# Patient Record
Sex: Male | Born: 1960 | Race: White | Hispanic: No | Marital: Married | Smoking: Former smoker
Health system: Southern US, Community
[De-identification: ages and names within clinical notes are randomized; demographics above are authoritative.]

## PROBLEM LIST (undated history)

## (undated) DIAGNOSIS — I509 Heart failure, unspecified: Secondary | ICD-10-CM

## (undated) DIAGNOSIS — Z599 Problem related to housing and economic circumstances, unspecified: Secondary | ICD-10-CM

## (undated) DIAGNOSIS — G4730 Sleep apnea, unspecified: Secondary | ICD-10-CM

## (undated) DIAGNOSIS — I4891 Unspecified atrial fibrillation: Secondary | ICD-10-CM

## (undated) DIAGNOSIS — Z598 Other problems related to housing and economic circumstances: Secondary | ICD-10-CM

## (undated) DIAGNOSIS — I4819 Other persistent atrial fibrillation: Secondary | ICD-10-CM

## (undated) DIAGNOSIS — Z91199 Patient's noncompliance with other medical treatment and regimen due to unspecified reason: Secondary | ICD-10-CM

## (undated) DIAGNOSIS — I429 Cardiomyopathy, unspecified: Secondary | ICD-10-CM

## (undated) DIAGNOSIS — Z5989 Other problems related to housing and economic circumstances: Secondary | ICD-10-CM

## (undated) DIAGNOSIS — I10 Essential (primary) hypertension: Secondary | ICD-10-CM

## (undated) DIAGNOSIS — Z5971 Insufficient health insurance coverage: Secondary | ICD-10-CM

## (undated) DIAGNOSIS — E119 Type 2 diabetes mellitus without complications: Secondary | ICD-10-CM

## (undated) DIAGNOSIS — Z9119 Patient's noncompliance with other medical treatment and regimen: Secondary | ICD-10-CM

## (undated) DIAGNOSIS — Z7901 Long term (current) use of anticoagulants: Secondary | ICD-10-CM

## (undated) HISTORY — DX: Long term (current) use of anticoagulants: Z79.01

## (undated) HISTORY — DX: Essential (primary) hypertension: I10

## (undated) HISTORY — DX: Type 2 diabetes mellitus without complications: E11.9

## (undated) HISTORY — DX: Unspecified atrial fibrillation: I48.91

## (undated) HISTORY — DX: Sleep apnea, unspecified: G47.30

## (undated) HISTORY — DX: Cardiomyopathy, unspecified: I42.9

---
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
## 2014-04-07 ENCOUNTER — Telehealth: Payer: Self-pay | Admitting: Dietician

## 2014-04-07 NOTE — Telephone Encounter (Signed)
Opened in error

## 2017-09-26 ENCOUNTER — Encounter (HOSPITAL_COMMUNITY): Payer: Self-pay | Admitting: Emergency Medicine

## 2017-09-26 ENCOUNTER — Inpatient Hospital Stay (HOSPITAL_COMMUNITY)
Admission: EM | Admit: 2017-09-26 | Discharge: 2017-09-30 | DRG: 310 | Disposition: A | Discharge status: Home / Self Care | Payer: Self-pay | Attending: Cardiovascular Disease | Admitting: Cardiovascular Disease

## 2017-09-26 ENCOUNTER — Emergency Department (HOSPITAL_COMMUNITY): Payer: Self-pay

## 2017-09-26 ENCOUNTER — Other Ambulatory Visit: Payer: Self-pay

## 2017-09-26 DIAGNOSIS — G4730 Sleep apnea, unspecified: Secondary | ICD-10-CM | POA: Diagnosis present

## 2017-09-26 DIAGNOSIS — I429 Cardiomyopathy, unspecified: Secondary | ICD-10-CM | POA: Diagnosis present

## 2017-09-26 DIAGNOSIS — E119 Type 2 diabetes mellitus without complications: Secondary | ICD-10-CM

## 2017-09-26 DIAGNOSIS — Z7901 Long term (current) use of anticoagulants: Secondary | ICD-10-CM

## 2017-09-26 DIAGNOSIS — I4819 Other persistent atrial fibrillation: Secondary | ICD-10-CM

## 2017-09-26 DIAGNOSIS — Z794 Long term (current) use of insulin: Secondary | ICD-10-CM

## 2017-09-26 DIAGNOSIS — R7309 Other abnormal glucose: Secondary | ICD-10-CM

## 2017-09-26 DIAGNOSIS — I481 Persistent atrial fibrillation: Principal | ICD-10-CM | POA: Diagnosis present

## 2017-09-26 DIAGNOSIS — I4891 Unspecified atrial fibrillation: Secondary | ICD-10-CM | POA: Diagnosis present

## 2017-09-26 DIAGNOSIS — E1165 Type 2 diabetes mellitus with hyperglycemia: Secondary | ICD-10-CM | POA: Diagnosis present

## 2017-09-26 DIAGNOSIS — I10 Essential (primary) hypertension: Secondary | ICD-10-CM | POA: Diagnosis present

## 2017-09-26 DIAGNOSIS — I4821 Permanent atrial fibrillation: Secondary | ICD-10-CM | POA: Diagnosis present

## 2017-09-26 DIAGNOSIS — R072 Precordial pain: Secondary | ICD-10-CM

## 2017-09-26 DIAGNOSIS — F17200 Nicotine dependence, unspecified, uncomplicated: Secondary | ICD-10-CM | POA: Diagnosis present

## 2017-09-26 DIAGNOSIS — Z8249 Family history of ischemic heart disease and other diseases of the circulatory system: Secondary | ICD-10-CM

## 2017-09-26 DIAGNOSIS — F17210 Nicotine dependence, cigarettes, uncomplicated: Secondary | ICD-10-CM | POA: Diagnosis present

## 2017-09-26 DIAGNOSIS — G4733 Obstructive sleep apnea (adult) (pediatric): Secondary | ICD-10-CM | POA: Diagnosis present

## 2017-09-26 HISTORY — DX: Type 2 diabetes mellitus without complications: E11.9

## 2017-09-26 HISTORY — DX: Unspecified atrial fibrillation: I48.91

## 2017-09-26 HISTORY — DX: Essential (primary) hypertension: I10

## 2017-09-26 LAB — GLUCOSE, CAPILLARY: Glucose-Capillary: 241 mg/dL — ABNORMAL HIGH (ref 70–99)

## 2017-09-26 LAB — BASIC METABOLIC PANEL
ANION GAP: 13 (ref 5–15)
BUN: 12 mg/dL (ref 6–20)
CO2: 22 mmol/L (ref 22–32)
Calcium: 9.8 mg/dL (ref 8.9–10.3)
Chloride: 100 mmol/L (ref 98–111)
Creatinine, Ser: 1.14 mg/dL (ref 0.61–1.24)
Glucose, Bld: 322 mg/dL — ABNORMAL HIGH (ref 70–99)
Potassium: 3.9 mmol/L (ref 3.5–5.1)
SODIUM: 135 mmol/L (ref 135–145)

## 2017-09-26 LAB — CBC
HCT: 56.7 % — ABNORMAL HIGH (ref 39.0–52.0)
HEMOGLOBIN: 19.6 g/dL — AB (ref 13.0–17.0)
MCH: 32.1 pg (ref 26.0–34.0)
MCHC: 34.6 g/dL (ref 30.0–36.0)
MCV: 93 fL (ref 78.0–100.0)
Platelets: 262 10*3/uL (ref 150–400)
RBC: 6.1 MIL/uL — AB (ref 4.22–5.81)
RDW: 12.6 % (ref 11.5–15.5)
WBC: 10.8 10*3/uL — AB (ref 4.0–10.5)

## 2017-09-26 LAB — HEMOGLOBIN A1C
Hgb A1c MFr Bld: 8.5 % — ABNORMAL HIGH (ref 4.8–5.6)
Mean Plasma Glucose: 197.25 mg/dL

## 2017-09-26 LAB — PROTIME-INR
INR: 1.13
Prothrombin Time: 14.4 seconds (ref 11.4–15.2)

## 2017-09-26 LAB — BRAIN NATRIURETIC PEPTIDE: B Natriuretic Peptide: 105.4 pg/mL — ABNORMAL HIGH (ref 0.0–100.0)

## 2017-09-26 LAB — I-STAT TROPONIN, ED: TROPONIN I, POC: 0.02 ng/mL (ref 0.00–0.08)

## 2017-09-26 LAB — T4, FREE: Free T4: 0.79 ng/dL — ABNORMAL LOW (ref 0.82–1.77)

## 2017-09-26 LAB — MAGNESIUM: MAGNESIUM: 1.7 mg/dL (ref 1.7–2.4)

## 2017-09-26 LAB — APTT: aPTT: 32 s (ref 24–36)

## 2017-09-26 LAB — TSH: TSH: 1.103 u[IU]/mL (ref 0.350–4.500)

## 2017-09-26 IMAGING — DX DG CHEST 1V PORT
1 series · 1 of 1 positions shown · non-contrast
Comparison: None.

CLINICAL DATA: Chest pain and nausea

EXAM:
PORTABLE CHEST 1 VIEW

[chest ap]
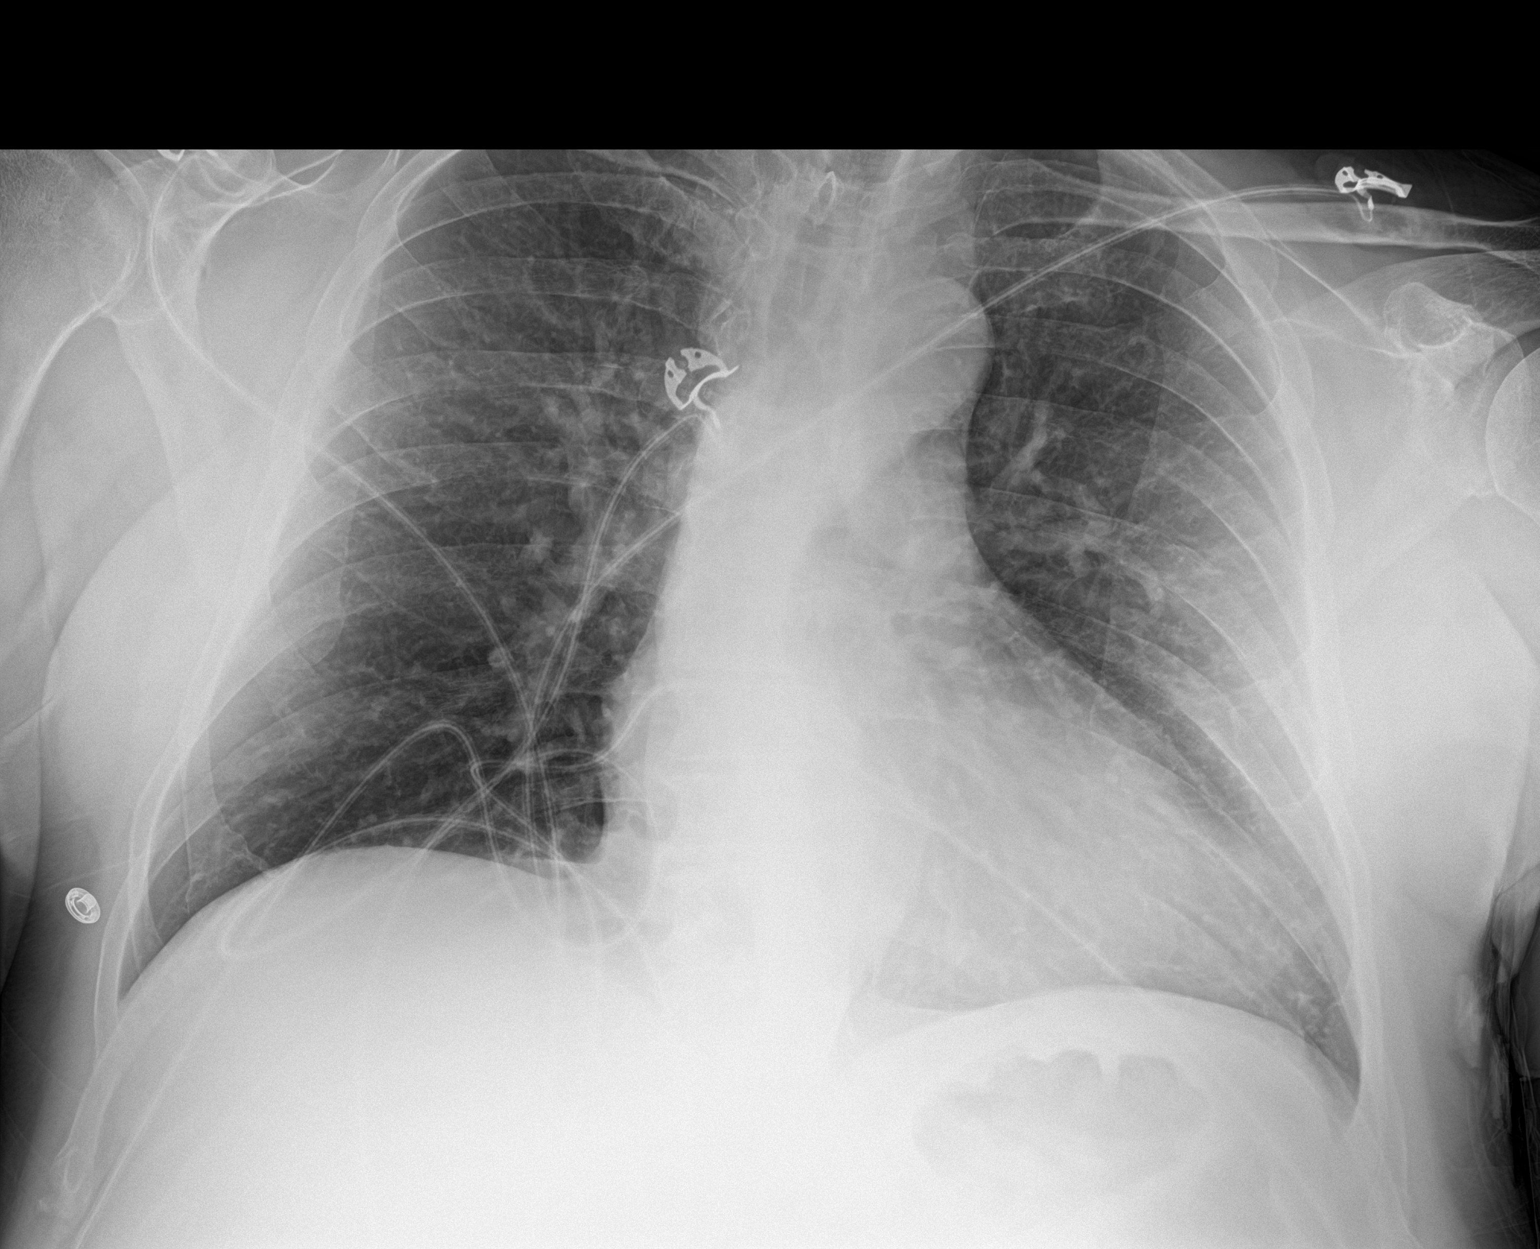

[1 of 1 positions shown; findings below may reference images not displayed]

FINDINGS: The heart size and mediastinal contours are within normal limits.
Both lungs are clear. The visualized skeletal structures are
unremarkable.
IMPRESSION: No active disease.

## 2017-09-26 MED ORDER — DILTIAZEM HCL-DEXTROSE 100-5 MG/100ML-% IV SOLN (PREMIX)
5.0000 mg/h | INTRAVENOUS | Status: DC
  Administered 2017-09-26: 5 mg/h via INTRAVENOUS
  Filled 2017-09-26: qty 100

## 2017-09-26 MED ORDER — LOSARTAN POTASSIUM 25 MG PO TABS
25.0000 mg | ORAL_TABLET | Freq: Every day | ORAL | Status: DC
  Administered 2017-09-26 – 2017-09-30 (×5): 25 mg via ORAL
  Filled 2017-09-26 (×5): qty 1

## 2017-09-26 MED ORDER — SODIUM CHLORIDE 0.9 % IV BOLUS
1000.0000 mL | Freq: Once | INTRAVENOUS | Status: AC
  Administered 2017-09-26: 1000 mL via INTRAVENOUS

## 2017-09-26 MED ORDER — ACETAMINOPHEN 325 MG PO TABS
650.0000 mg | ORAL_TABLET | ORAL | Status: DC | PRN
  Administered 2017-09-28: 650 mg via ORAL
  Filled 2017-09-26 (×3): qty 2

## 2017-09-26 MED ORDER — APIXABAN 5 MG PO TABS
5.0000 mg | ORAL_TABLET | Freq: Two times a day (BID) | ORAL | Status: DC
  Administered 2017-09-26 – 2017-09-30 (×8): 5 mg via ORAL
  Filled 2017-09-26 (×10): qty 1

## 2017-09-26 MED ORDER — DILTIAZEM HCL-DEXTROSE 100-5 MG/100ML-% IV SOLN (PREMIX)
5.0000 mg/h | INTRAVENOUS | Status: DC
  Administered 2017-09-26: 5 mg/h via INTRAVENOUS

## 2017-09-26 MED ORDER — ONDANSETRON HCL 4 MG/2ML IJ SOLN: 4.0000 mg | Freq: Four times a day (QID) | INTRAMUSCULAR | Status: DC | PRN

## 2017-09-26 MED ORDER — DILTIAZEM LOAD VIA INFUSION
10.0000 mg | Freq: Once | INTRAVENOUS | Status: AC
  Administered 2017-09-26: 10 mg via INTRAVENOUS
  Filled 2017-09-26: qty 10

## 2017-09-26 NOTE — ED Triage Notes (Signed)
PT tacy at 130s. Been feeling bad for several weeks. Thought stress induced. Today woke up and felt nauseated, head hurt, nose bleed, dizzy, chest pain.

## 2017-09-26 NOTE — ED Notes (Signed)
RN did not return call for report. Called back to give report. Placed on hold by receiving RN.

## 2017-09-26 NOTE — ED Provider Notes (Signed)
[facility] EMERGENCY DEPARTMENT Provider Note   CSN: [id number] Arrival date & time: 09/26/17  1204     History   Chief Complaint Chief Complaint  Patient presents with  . Tachycardia    HPI David [name] is a 57 y.o. male.  The history is provided by the patient and medical records. No language interpreter was used.  Chest Pain   This is a new problem. The current episode started more than 1 week ago. The problem occurs daily. The problem has not changed since onset.The pain is associated with exertion. The pain is present in the substernal region. The pain is moderate. The quality of the pain is described as exertional, heavy and pressure-like. The pain does not radiate. Duration of episode(s) is 5 minutes. The symptoms are aggravated by exertion. Associated symptoms include diaphoresis (resolved), lower extremity edema (bilateral), malaise/fatigue, nausea, palpitations and shortness of breath. Pertinent negatives include no abdominal pain, no back pain, no cough, no dizziness, no fever, no headaches, no near-syncope, no sputum production, no syncope and no vomiting. He has tried rest for the symptoms. The treatment provided mild relief.    History reviewed. No pertinent past medical history.  There are no active problems to display for this patient.   History reviewed. No pertinent surgical history.      Home Medications    Prior to Admission medications   Not on File    Family History History reviewed. No pertinent family history.  Social History Social History   Tobacco Use  . Smoking status: Current Some Day Smoker    Types: Cigarettes  Substance Use Topics  . Alcohol use: Not on file  . Drug use: Not on file     Allergies   Patient has no allergy information on record.   Review of Systems Review of Systems  Constitutional: Positive for diaphoresis (resolved), fatigue and malaise/fatigue. Negative for chills and fever.  HENT:  Negative for congestion.   Eyes: Negative for visual disturbance.  Respiratory: Positive for shortness of breath. Negative for cough, sputum production, chest tightness, wheezing and stridor.   Cardiovascular: Positive for chest pain, palpitations and leg swelling. Negative for syncope and near-syncope.  Gastrointestinal: Positive for nausea. Negative for abdominal pain, constipation, diarrhea and vomiting.  Genitourinary: Negative for flank pain.  Musculoskeletal: Negative for back pain, neck pain and neck stiffness.  Skin: Negative for rash and wound.  Neurological: Positive for light-headedness. Negative for dizziness, syncope and headaches.  All other systems reviewed and are negative.    Physical Exam Updated Vital Signs BP (!) 148/115   Pulse 94   Resp (!) 27   SpO2 95%   Physical Exam  Constitutional: He is oriented to person, place, and time. He appears well-developed and well-nourished. No distress.  HENT:  Head: Normocephalic and atraumatic.  Nose: Nose normal.  Mouth/Throat: Oropharynx is clear and moist. No oropharyngeal exudate.  Eyes: Pupils are equal, round, and reactive to light. Conjunctivae and EOM are normal.  Neck: Normal range of motion. Neck supple.  Cardiovascular: Normal rate and regular rhythm.  No murmur heard. Pulmonary/Chest: Effort normal and breath sounds normal. No respiratory distress. He has no wheezes. He exhibits no tenderness.  Abdominal: Soft. There is no tenderness. There is no guarding.  Musculoskeletal: He exhibits no edema or tenderness.  Neurological: He is alert and oriented to person, place, and time. No sensory deficit. He exhibits normal muscle tone.  Skin: Skin is warm and dry. He is not diaphoretic.  Psychiatric: He has a normal mood and affect.  Nursing note and vitals reviewed.    ED Treatments / Results  Labs (all labs ordered are listed, but only abnormal results are displayed) Labs Reviewed  BASIC METABOLIC PANEL -  Abnormal; Notable for the following components:      Result Value   Glucose, Bld 322 (*)    All other components within normal limits  CBC - Abnormal; Notable for the following components:   WBC 10.8 (*)    RBC 6.10 (*)    Hemoglobin 19.6 (*)    HCT 56.7 (*)    All other components within normal limits  MAGNESIUM  I-STAT TROPONIN, ED    EKG EKG Interpretation  Date/Time:  Tuesday September 26 2017 12:08:29 EDT Ventricular Rate:  138 PR Interval:    QRS Duration: 92 QT Interval:  320 QTC Calculation: 484 R Axis:   73 Text Interpretation:  Atrial fibrillation with rapid ventricular response Septal infarct , age undetermined Abnormal ECG A fib with RVR.  No Prior ECG for comparison.  No STEMI Confirmed by Theda Belfast ([id number]) on 09/26/2017 12:19:11 PM Also confirmed by Theda Belfast ([id number]), editor Sheppard Evens [phone])  on 09/26/2017 12:41:40 PM   Radiology Dg Chest Portable 1 View  Result Date: 09/26/2017 CLINICAL DATA:  Chest pain and nausea EXAM: PORTABLE CHEST 1 VIEW COMPARISON:  None. FINDINGS: The heart size and mediastinal contours are within normal limits. Both lungs are clear. The visualized skeletal structures are unremarkable. IMPRESSION: No active disease. Electronically Signed   By: Alcide Clever M.D.   On: 09/26/2017 12:38    Procedures Procedures (including critical care time)  CRITICAL CARE Performed by: Canary Brim Shruthi Northrup Total critical care time: 35 minutes Critical care time was exclusive of separately billable procedures and treating other patients. A fib with RVR on Dilt drip and cards admission Critical care was necessary to treat or prevent imminent or life-threatening deterioration. Critical care was time spent personally by me on the following activities: development of treatment plan with patient and/or surrogate as well as nursing, discussions with consultants, evaluation of patient's response to treatment, examination of patient, obtaining history  from patient or surrogate, ordering and performing treatments and interventions, ordering and review of laboratory studies, ordering and review of radiographic studies, pulse oximetry and re-evaluation of patient's condition.  Medications Ordered in ED Medications  diltiazem (CARDIZEM) 1 mg/mL load via infusion 10 mg (10 mg Intravenous Bolus from Bag 09/26/17 1536)    And  diltiazem (CARDIZEM) 100 mg in dextrose 5% (1 mg/mL) infusion (5 mg/hr Intravenous New Bag/Given 09/26/17 1536)  sodium chloride 0.9 % bolus 1,000 mL (1,000 mLs Intravenous New Bag/Given 09/26/17 1531)     Initial Impression / Assessment and Plan / ED Course  I have reviewed the triage vital signs and the nursing notes.  Pertinent labs & imaging results that were available during my care of the patient were reviewed by me and considered in my medical decision making (see chart for details).     David [name] is a 57 y.o. male with no significant past medical history who presents with chest pain, nausea, shortness breath, and fatigue.  Patient reports that for the last 3 weeks he has had fatigue.  He reports that intermittently has been feeling short of breath, tachycardic and palpitations.  He is also been feeling some chest pain.  When he has a chest pain he gets some nausea vomiting and diaphoresis.  He also gets lightheaded  but does not feel it currently.  He has never had this before and has no history of arrhythmias.  He reports he has not seen a doctor in "sometime".  On exam, patient was found to be tachycardic with a heart rate in the 130s.  Patient found to be in atrial fibrillation with RVR.  Lungs were clear and chest was nontender.  Abdomen was nontender and patient otherwise appears well.  Clinically I think patient has been in A. fib for several weeks and has having intermittent episodes of RVR.  Patient does report that today he had some bilateral ankle edema causing him to wear water shoes that fit better.  I  did not appreciate significant swelling in his bilateral legs and I have a low suspicion for any thromboembolic cause of his symptoms.    Patient given diltiazem and will be admitted to cardiology for further management of new A. fib with RVR.  As his symptoms have been going on longer than 48 hours I do not feel comfortable with the ED cardioversion.  Patient agreed with plan of care and cardiology was called.  There will much further management.    Patient admitted in stable condition.   Final Clinical Impressions(s) / ED Diagnoses   Final diagnoses:  Atrial fibrillation with RVR (HCC)  Precordial pain     Clinical Impression: 1. Persistent atrial fibrillation (HCC)   2. Atrial fibrillation with RVR (HCC)   3. Precordial pain     Disposition: Admit  This note was prepared with assistance of Dragon voice recognition software. Occasional wrong-word or sound-a-like substitutions may have occurred due to the inherent limitations of voice recognition software.     Chrissie Dacquisto, Canary Brim, MD 09/26/17 1714

## 2017-09-26 NOTE — ED Notes (Signed)
Called lab to have Magnesium added on.

## 2017-09-26 NOTE — H&P (Addendum)
Physician History and Physical     Patient ID: David [name] MRN: [id number] DOB/AGE: 56-Dec-1962 56 y.o. Admit date: 09/26/2017  Primary Care Physician: David [name] Primary Cardiologist: David [name]  Active Problems:   A-fib (HCC)   HTN (hypertension)   Elevated glucose   HPI:  57 y.o. rarely goes to MD. David [name] from [place] about 2 years ago after marital problems. 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 Felt poorly for 2 months. Fatigue , Nausea, palpitations. Some dyspnea as well. Takes no meds. Rare smoking and rare ETOH. Denies symptoms of DM but BS 323 on admission . Denies chronic lung issues but Hct elevated on admission. In ER found to be in rapid afib and quite HTN.    This patients CHA2DS2-VASc Score and unadjusted Ischemic Stroke Rate (% [name] year) is equal to 2.2 % stroke rate/year from a score of 2  Above score calculated as 1 point each if present [CHF, HTN, DM, Vascular=MI/PAD/Aortic Plaque, Age if 65-74, or Male] Above score calculated as 2 points each if present [Age > 75, or Stroke/TIA/TE]   Currently in ER no chest pain. No signs of CHF. Rates 120 range Denies history of TIA Or bleeding diathesis No history of renal issues   Review of systems complete and found to be negative unless listed above   History reviewed. No pertinent past medical history.  Family History  Problem Relation Age of Onset  . Hypertension Mother     Social History   Socioeconomic History  . Marital status: Legally Separated    Spouse name: Not on file  . Number of children: Not on file  . Years of education: Not on file  . Highest education level: Not on file  Occupational History  . Not on file  Social Needs  . Financial resource strain: Not on file  . Food insecurity:    Worry: Not on file    Inability: Not on file  . Transportation needs:    Medical: Not on file    Non-medical: Not on file  Tobacco Use  . Smoking status: Current Some Day Smoker    Types: Cigarettes    Substance and Sexual Activity  . Alcohol use: Yes    Comment: occassional beer previous bartender  . Drug use: Never  . Sexual activity: Yes  Lifestyle  . Physical activity:    Days [name] week: Not on file    Minutes [name] session: Not on file  . Stress: Very much  Relationships  . Social connections:    Talks on phone: Not on file    Gets together: Not on file    Attends religious service: Not on file    Active member of club or organization: Not on file    Attends meetings of clubs or organizations: Not on file    Relationship status: Not on file  . Intimate partner violence:    Fear of current or ex partner: Not on file    Emotionally abused: Not on file    Physically abused: Not on file    Forced sexual activity: Not on file  Other Topics Concern  . Not on file  Social History Narrative  . Not on file    History reviewed. No pertinent surgical history.    (Not in a hospital admission)  Physical Exam: Blood pressure (!) 173/160, pulse (!) 123, resp. rate 18, SpO2 95 %. Affect appropriate Healthy:  appears stated age HEENT: normal Neck supple with no adenopathy JVP normal no  bruits no thyromegaly Lungs clear with no wheezing and good diaphragmatic motion Heart:  S1/S2 no murmur, no rub, gallop or click PMI normal Abdomen: benighn, BS positve, no tenderness, no AAA no bruit.  No HSM or HJR Distal pulses intact with no bruits No edema Neuro non-focal Skin warm and dry tatto on right shoulder  No muscular weakness  No current facility-administered medications on file prior to encounter.    No current outpatient medications on file prior to encounter.    Labs:   Lab Results  Component Value Date   WBC 10.8 (H) 09/26/2017   HGB 19.6 (H) 09/26/2017   HCT 56.7 (H) 09/26/2017   MCV 93.0 09/26/2017   PLT 262 09/26/2017    Recent Labs  Lab 09/26/17 1220  NA 135  K 3.9  CL 100  CO2 22  BUN 12  CREATININE 1.14  CALCIUM 9.8  GLUCOSE 322*        Radiology: Dg Chest Portable 1 View  Result Date: 09/26/2017 CLINICAL DATA:  Chest pain and nausea EXAM: PORTABLE CHEST 1 VIEW COMPARISON:  None. FINDINGS: The heart size and mediastinal contours are within normal limits. Both lungs are clear. The visualized skeletal structures are unremarkable. IMPRESSION: No active disease. Electronically Signed   By: David [name] M.D.   On: 09/26/2017 12:38    EKG: rapid afib no acute ischemic changes or pre excitation   ASSESSMENT AND PLAN:   1. Afib:  Will start eliquis IV cardizem with transition to oral when HR less than 100 bpm TTE to assess EF Tentatively plan TEE/DCC Thursday if rate hard to control   2. HTN:  In addition to lopressor will start ARB  3. BS:  Check A1c SS insulin and start glucophage in am if A1c is elevated low carb diet   Signed: Theron Arista Nishan6/25/2019, 3:14 PM

## 2017-09-26 NOTE — ED Notes (Signed)
Attempted to call report to [address] unsuccessfully x 1.

## 2017-09-27 ENCOUNTER — Inpatient Hospital Stay (HOSPITAL_COMMUNITY): Payer: Self-pay

## 2017-09-27 DIAGNOSIS — I480 Paroxysmal atrial fibrillation: Secondary | ICD-10-CM

## 2017-09-27 DIAGNOSIS — I371 Nonrheumatic pulmonary valve insufficiency: Secondary | ICD-10-CM

## 2017-09-27 LAB — GLUCOSE, CAPILLARY
GLUCOSE-CAPILLARY: 208 mg/dL — AB (ref 70–99)
GLUCOSE-CAPILLARY: 229 mg/dL — AB (ref 70–99)
GLUCOSE-CAPILLARY: 182 mg/dL — AB (ref 70–99)
Glucose-Capillary: 242 mg/dL — ABNORMAL HIGH (ref 70–99)

## 2017-09-27 LAB — ECHOCARDIOGRAM COMPLETE: WEIGHTICAEL: 3142.4 [oz_av]

## 2017-09-27 LAB — HIV ANTIBODY (ROUTINE TESTING W REFLEX): HIV SCREEN 4TH GENERATION: NONREACTIVE

## 2017-09-27 MED ORDER — LIVING WELL WITH DIABETES BOOK
Freq: Once | Status: AC
  Administered 2017-09-27: 14:00:00
  Filled 2017-09-27: qty 1

## 2017-09-27 MED ORDER — SODIUM CHLORIDE 0.9 % IV SOLN
INTRAVENOUS | Status: DC
  Administered 2017-09-28: 06:00:00 via INTRAVENOUS

## 2017-09-27 MED ORDER — METFORMIN HCL 500 MG PO TABS
500.0000 mg | ORAL_TABLET | Freq: Every day | ORAL | Status: DC
  Administered 2017-09-27 – 2017-09-30 (×4): 500 mg via ORAL
  Filled 2017-09-27 (×4): qty 1

## 2017-09-27 MED ORDER — DILTIAZEM HCL 60 MG PO TABS
60.0000 mg | ORAL_TABLET | Freq: Three times a day (TID) | ORAL | Status: DC
  Administered 2017-09-27 – 2017-09-28 (×4): 60 mg via ORAL
  Filled 2017-09-27 (×4): qty 1

## 2017-09-27 NOTE — Plan of Care (Signed)
  Problem: Education: Goal: Knowledge of General Education information will improve Outcome: Progressing Goal: Knowledge of General Education information will improve Outcome: Progressing   Problem: Health Behavior/Discharge Planning: Goal: Ability to manage health-related needs will improve Outcome: Progressing   Problem: Clinical Measurements: Goal: Ability to maintain clinical measurements within normal limits will improve Outcome: Progressing Goal: Will remain free from infection Outcome: Progressing Goal: Diagnostic test results will improve Outcome: Progressing Goal: Respiratory complications will improve Outcome: Progressing Goal: Cardiovascular complication will be avoided Outcome: Progressing   Problem: Activity: Goal: Risk for activity intolerance will decrease Outcome: Progressing   Problem: Nutrition: Goal: Adequate nutrition will be maintained Outcome: Progressing   Problem: Coping: Goal: Level of anxiety will decrease Outcome: Progressing   Problem: Elimination: Goal: Will not experience complications related to bowel motility Outcome: Progressing Goal: Will not experience complications related to urinary retention Outcome: Progressing   Problem: Pain Managment: Goal: General experience of comfort will improve Outcome: Progressing   Problem: Safety: Goal: Ability to remain free from injury will improve Outcome: Progressing   Problem: Skin Integrity: Goal: Risk for impaired skin integrity will decrease Outcome: Progressing   

## 2017-09-27 NOTE — Progress Notes (Signed)
    Subjective:  Denies SSCP, palpitations or Dyspnea Feels better   Objective:  Vitals:   09/26/17 1530 09/26/17 1545 09/26/17 2150 09/27/17 0447  BP: (!) 169/117 (!) 148/115 (!) 135/94 111/80  Pulse: (!) 117 94 78 73  Resp: (!) 21 (!) 27 19 (!) 21  Temp:   98.5 F (36.9 C) 98.2 F (36.8 C)  TempSrc:   Oral Oral  SpO2: 95% 95% 98% 98%  Weight:    196 lb 6.4 oz (89.1 kg)    Intake/Output from previous day:  Intake/Output Summary (Last 24 hours) at 09/27/2017 7510 Last data filed at 09/27/2017 2585 Gross per 24 hour  Intake -  Output 500 ml  Net -500 ml    Physical Exam: Affect appropriate Healthy:  appears stated age HEENT: normal Neck supple with no adenopathy JVP normal no bruits no thyromegaly Lungs clear with no wheezing and good diaphragmatic motion Heart:  S1/S2 no murmur, no rub, gallop or click PMI normal Abdomen: benighn, BS positve, no tenderness, no AAA no bruit.  No HSM or HJR Distal pulses intact with no bruits No edema Neuro non-focal Skin warm and dry No muscular weakness   Lab Results: Basic Metabolic Panel: Recent Labs    09/26/17 1220  NA 135  K 3.9  CL 100  CO2 22  GLUCOSE 322*  BUN 12  CREATININE 1.14  CALCIUM 9.8  MG 1.7   Liver Function Tests: No results for input(s): AST, ALT, ALKPHOS, BILITOT, PROT, ALBUMIN in the last 72 hours. No results for input(s): LIPASE, AMYLASE in the last 72 hours. CBC: Recent Labs    09/26/17 1220  WBC 10.8*  HGB 19.6*  HCT 56.7*  MCV 93.0  PLT 262   Cardiac Enzymes: No results for input(s): CKTOTAL, CKMB, CKMBINDEX, TROPONINI in the last 72 hours. BNP: Invalid input(s): POCBNP D-Dimer: No results for input(s): DDIMER in the last 72 hours. Hemoglobin A1C: Recent Labs    09/26/17 1805  HGBA1C 8.5*   Fasting Lipid Panel: No results for input(s): CHOL, HDL, LDLCALC, TRIG, CHOLHDL, LDLDIRECT in the last 72 hours. Thyroid Function Tests: Recent Labs    09/26/17 1805  TSH 1.103    Anemia Panel: No results for input(s): VITAMINB12, FOLATE, FERRITIN, TIBC, IRON, RETICCTPCT in the last 72 hours.  Imaging: Dg Chest Portable 1 View  Result Date: 09/26/2017 CLINICAL DATA:  Chest pain and nausea EXAM: PORTABLE CHEST 1 VIEW COMPARISON:  None. FINDINGS: The heart size and mediastinal contours are within normal limits. Both lungs are clear. The visualized skeletal structures are unremarkable. IMPRESSION: No active disease. Electronically Signed   By: Alcide Clever M.D.   On: 09/26/2017 12:38    Cardiac Studies:  ECG: afib no acute ischemic changes   Telemetry: afib improved rate control 70-90 bpm  Echo: pending   Medications:   . apixaban  5 mg Oral BID  . diltiazem  60 mg Oral Q8H  . losartan  25 mg Oral Daily  . metFORMIN  500 mg Oral Q breakfast      Assessment/Plan:  Afib:  Duration unknown on eliquis rate control better TEE/DCC tomorrow TTE today D/C iv cardizem change to PO HTN:  Improved on ARB DM:  New diagnosis low carb diet start metformin   Charlton Haws 09/27/2017, 7:13 AM

## 2017-09-27 NOTE — Progress Notes (Signed)
  Echocardiogram 2D Echocardiogram has been performed.  David [name] 09/27/2017, 9:23 AM 

## 2017-09-27 NOTE — Progress Notes (Signed)
Inpatient Diabetes Program   AACE/ADA: New Consensus Statement on Inpatient Glycemic Control (2015)  Target Ranges:  Prepandial:   less than 140 mg/dL      Peak postprandial:   less than 180 mg/dL (1-2 hours)      Critically ill patients:  140 - 180 mg/dL   Spoke with patient about new diabetes diagnosis.  Discussed A1C results 8.5% this admission and explained what an A1C is. Discussed basic pathophysiology of DM Type 2, basic home care, importance of checking CBGs and maintaining good CBG control to prevent long-term and short-term complications. Reviewed glucose and A1C goals.  Reviewed signs and symptoms of hyperglycemia and hypoglycemia along with treatment for both.   Discussed impact of nutrition, exercise, stress, sickness, and medications on diabetes control. Patient drinks unsweet tea, gatorade, water, and occasionally wine. Spoke to patient about  carbohydrates, carbohydrate goals per day and meal, along with portion sizes. Patient reports eating 1 serving of sweets daily.   Reviewed Living Well with diabetes booklet and encouraged patient to read through entire book. Informed patient that he will be prescribed Metformin at time of d/c, to take with food, and to report any untolerated GI side effects to his future PCP.  Asked patient to check his glucose 1-2 times per day (alternating checks).   Gave patient the [facility] [employer] product price list for glucose meters and supplies. Patient is on Metformin Currently which is $4. Spoke with CM about patient plan of care.  Patient verbalized understanding of information discussed and he states that he has no further questions at this time related to diabetes.   RNs to provide ongoing basic DM education at bedside with this patient.   Thanks, Christena Deem RN, MSN, BC-ADM, [facility] Inpatient Diabetes Coordinator Team Pager [phone] (8a-5p)

## 2017-09-27 NOTE — Progress Notes (Signed)
Inpatient Diabetes Program Recommendations  AACE/ADA: New Consensus Statement on Inpatient Glycemic Control (2015)  Target Ranges:  Prepandial:   less than 140 mg/dL      Peak postprandial:   less than 180 mg/dL (1-2 hours)      Critically ill patients:  140 - 180 mg/dL   Review of Glycemic Control  Diabetes history: None Current orders for Inpatient glycemic control: Metformin 500 mg Daily  A1c 8.5% this admission. Per ADA meeting criteria for New DM diagnosis.  Noted Metformin started. Have ordered the living well with DM educational booklet. Ordered DM educational videos for the RN to assist patient in viewing. Ordered Dietitian consult to touch base with patient since he does not have insurance to cover formal education. Have ordered CM consult due to no PCP on file.  Will see patient today.  Thanks,  Christena Deem RN, MSN, BC-ADM, [facility] Inpatient Diabetes Coordinator Team Pager [phone] (8a-5p)

## 2017-09-28 ENCOUNTER — Inpatient Hospital Stay (HOSPITAL_COMMUNITY): Payer: Self-pay | Admitting: Certified Registered"

## 2017-09-28 ENCOUNTER — Encounter (HOSPITAL_COMMUNITY): Payer: Self-pay | Admitting: *Deleted

## 2017-09-28 ENCOUNTER — Inpatient Hospital Stay (HOSPITAL_COMMUNITY): Payer: Self-pay

## 2017-09-28 ENCOUNTER — Telehealth: Payer: Self-pay | Admitting: *Deleted

## 2017-09-28 ENCOUNTER — Encounter (HOSPITAL_COMMUNITY)
Admission: EM | Disposition: A | Discharge status: Home / Self Care | Payer: Self-pay | Source: Home / Self Care | Attending: Cardiovascular Disease

## 2017-09-28 DIAGNOSIS — I4891 Unspecified atrial fibrillation: Secondary | ICD-10-CM

## 2017-09-28 DIAGNOSIS — I421 Obstructive hypertrophic cardiomyopathy: Secondary | ICD-10-CM

## 2017-09-28 DIAGNOSIS — G4733 Obstructive sleep apnea (adult) (pediatric): Secondary | ICD-10-CM

## 2017-09-28 HISTORY — PX: TEE WITHOUT CARDIOVERSION: SHX5443

## 2017-09-28 HISTORY — PX: CARDIOVERSION: SHX1299

## 2017-09-28 LAB — GLUCOSE, CAPILLARY
GLUCOSE-CAPILLARY: 227 mg/dL — AB (ref 70–99)
GLUCOSE-CAPILLARY: 299 mg/dL — AB (ref 70–99)
GLUCOSE-CAPILLARY: 166 mg/dL — AB (ref 70–99)
Glucose-Capillary: 238 mg/dL — ABNORMAL HIGH (ref 70–99)

## 2017-09-28 SURGERY — ECHOCARDIOGRAM, TRANSESOPHAGEAL
Anesthesia: General

## 2017-09-28 MED ORDER — LORAZEPAM 1 MG PO TABS
1.0000 mg | ORAL_TABLET | Freq: Four times a day (QID) | ORAL | Status: DC | PRN
  Administered 2017-09-28 – 2017-09-29 (×3): 1 mg via ORAL
  Filled 2017-09-28 (×3): qty 1

## 2017-09-28 MED ORDER — MIDAZOLAM HCL 5 MG/5ML IJ SOLN
INTRAMUSCULAR | Status: DC | PRN
  Administered 2017-09-28: 2 mg via INTRAVENOUS

## 2017-09-28 MED ORDER — HYDROCORTISONE 1 % EX CREA
TOPICAL_CREAM | Freq: Two times a day (BID) | CUTANEOUS | Status: DC | PRN
  Administered 2017-09-28: 13:00:00 via TOPICAL
  Filled 2017-09-28: qty 28

## 2017-09-28 MED ORDER — SOTALOL HCL 80 MG PO TABS
80.0000 mg | ORAL_TABLET | Freq: Two times a day (BID) | ORAL | Status: DC
  Administered 2017-09-28 – 2017-09-30 (×5): 80 mg via ORAL
  Filled 2017-09-28 (×5): qty 1

## 2017-09-28 MED ORDER — PROPOFOL 10 MG/ML IV BOLUS
INTRAVENOUS | Status: DC | PRN
  Administered 2017-09-28: 20 mg via INTRAVENOUS

## 2017-09-28 MED ORDER — INSULIN ASPART 100 UNIT/ML ~~LOC~~ SOLN
0.0000 [IU] | Freq: Three times a day (TID) | SUBCUTANEOUS | Status: DC
  Administered 2017-09-28: 2 [IU] via SUBCUTANEOUS
  Administered 2017-09-28: 5 [IU] via SUBCUTANEOUS
  Administered 2017-09-29: 3 [IU] via SUBCUTANEOUS
  Administered 2017-09-29: 2 [IU] via SUBCUTANEOUS
  Administered 2017-09-30: 3 [IU] via SUBCUTANEOUS

## 2017-09-28 MED ORDER — INSULIN ASPART 100 UNIT/ML ~~LOC~~ SOLN
0.0000 [IU] | Freq: Every day | SUBCUTANEOUS | Status: DC
  Administered 2017-09-29: 2 [IU] via SUBCUTANEOUS

## 2017-09-28 MED ORDER — LACTATED RINGERS IV SOLN
INTRAVENOUS | Status: DC | PRN
  Administered 2017-09-28: 08:00:00 via INTRAVENOUS

## 2017-09-28 MED ORDER — PROPOFOL 500 MG/50ML IV EMUL
INTRAVENOUS | Status: DC | PRN
  Administered 2017-09-28: 100 ug/kg/min via INTRAVENOUS

## 2017-09-28 MED ORDER — DILTIAZEM HCL 30 MG PO TABS
30.0000 mg | ORAL_TABLET | Freq: Three times a day (TID) | ORAL | Status: DC
  Administered 2017-09-28 – 2017-09-30 (×6): 30 mg via ORAL
  Filled 2017-09-28 (×7): qty 1

## 2017-09-28 MED ORDER — BUTAMBEN-TETRACAINE-BENZOCAINE 2-2-14 % EX AERO
INHALATION_SPRAY | CUTANEOUS | Status: DC | PRN
  Administered 2017-09-28: 2 via TOPICAL

## 2017-09-28 MED ORDER — LIDOCAINE HCL (CARDIAC) PF 100 MG/5ML IV SOSY
PREFILLED_SYRINGE | INTRAVENOUS | Status: DC | PRN
  Administered 2017-09-28: 30 mg via INTRAVENOUS

## 2017-09-28 NOTE — Progress Notes (Signed)
   I have sent a message to Veda Canning at our [facility] office to arrange for outpatient sleep study and follow up with Dr. Mayford Knife.   I have also ordered an exercise myoview to evaluate for ischemic cause of new onset afib, Per Dr. Eden Emms.  Berton Bon, AGNP-C [facility] 09/28/2017  11:41 AM Pager: [phone]

## 2017-09-28 NOTE — Plan of Care (Deleted)
IV lasix for diuresis. Strict I and O. Daily wts. Cont. Pulse ox monitoring on 4L currently.

## 2017-09-28 NOTE — Plan of Care (Signed)
Controlled rate in Afib. Unsuccessful CV today. Plan Nuc. Med Stress test tomorrow.

## 2017-09-28 NOTE — Anesthesia Postprocedure Evaluation (Signed)
Anesthesia Post Note  Patient: David [name]  Procedure(s) Performed: TRANSESOPHAGEAL ECHOCARDIOGRAM (TEE) (N/A ) CARDIOVERSION (N/A )     Patient location during evaluation: PACU Anesthesia Type: General Pain management: pain level controlled Vital Signs Assessment: post-procedure vital signs reviewed and stable Respiratory status: spontaneous breathing Cardiovascular status: stable Anesthetic complications: no    Last Vitals:  Vitals:   09/28/17 0905 09/28/17 0921  BP: 100/67 106/79  Pulse: (!) 58 (!) 49  Resp: 20   Temp:  36.7 C  SpO2: 98% 97%    Last Pain:  Vitals:   09/28/17 0921  TempSrc: Oral  PainSc:                  Amori Colomb

## 2017-09-28 NOTE — Anesthesia Procedure Notes (Addendum)
Procedure Name: MAC Date/Time: 09/28/2017 8:00 AM Performed by: Lavell Luster, CRNA Pre-anesthesia Checklist: Patient identified, Emergency Drugs available, Suction available, Patient being monitored and Timeout performed Patient Re-evaluated:Patient Re-evaluated prior to induction Oxygen Delivery Method: Nasal cannula Preoxygenation: Pre-oxygenation with 100% oxygen Induction Type: IV induction Placement Confirmation: CO2 detector,  breath sounds checked- equal and bilateral and positive ETCO2 Dental Injury: Teeth and Oropharynx as per pre-operative assessment

## 2017-09-28 NOTE — Anesthesia Preprocedure Evaluation (Addendum)
Anesthesia Evaluation  Patient identified by MRN, date of birth, ID band Patient awake    Reviewed: Allergy & Precautions, NPO status , Patient's Chart, lab work & pertinent test results  Airway Mallampati: II  TM Distance: <3 FB Neck ROM: Full    Dental  (+) Teeth Intact, Dental Advisory Given   Pulmonary Current Smoker,    breath sounds clear to auscultation       Cardiovascular hypertension, (-) Valvular Problems/Murmurs Rhythm:Irregular Rate:Tachycardia     Neuro/Psych    GI/Hepatic negative GI ROS, Neg liver ROS,   Endo/Other  negative endocrine ROS  Renal/GU negative Renal ROS     Musculoskeletal   Abdominal (+)  Abdomen: soft. Bowel sounds: normal.  Peds  Hematology   Anesthesia Other Findings   Reproductive/Obstetrics                            Anesthesia Physical Anesthesia Plan  ASA: II  Anesthesia Plan: General   Post-op Pain Management:    Induction: Intravenous  PONV Risk Score and Plan: Treatment may vary due to age or medical condition  Airway Management Planned: Oral ETT  Additional Equipment:   Intra-op Plan:   Post-operative Plan: Extubation in OR  Informed Consent: I have reviewed the patients History and Physical, chart, labs and discussed the procedure including the risks, benefits and alternatives for the proposed anesthesia with the patient or authorized representative who has indicated his/her understanding and acceptance.   Dental advisory given  Plan Discussed with: CRNA, Anesthesiologist and Surgeon  Anesthesia Plan Comments:         Anesthesia Quick Evaluation

## 2017-09-28 NOTE — Progress Notes (Signed)
Nutrition Consult/Brief Note  RD consulted for nutrition education regarding new onset diabetes.   Lab Results  Component Value Date   HGBA1C 8.5 (H) 09/26/2017    Pt politely declined education at this time. He is feeling tired after a procedure. RD left "Carbohydrate Counting for People with Diabetes" handout from the [employer] of [employer].   Body mass index is 26.53 kg/m. Pt meets criteria for Overweight based on current BMI.  Current diet order is Carbohydrate Modified, patient is consuming approximately 100% of meals at this time.   Labs and medications reviewed. No further nutrition interventions warranted at this time.   RD to follow-up at later date.  Maureen Chatters, RD, LDN Pager #: [phone] After-Hours Pager #: [phone]

## 2017-09-28 NOTE — Progress Notes (Signed)
    Subjective:  Groggy post anesthesia for attempted TEE/DCC   Objective:  Vitals:   09/28/17 0837 09/28/17 0838 09/28/17 0839 09/28/17 0840  BP: 106/72     Pulse: 72 71 71 74  Resp: (!) 38 (!) 0 (!) 24 (!) 8  Temp:      TempSrc:      SpO2: 95% 92% 92% 93%  Weight:      Height:        Intake/Output from previous day:  Intake/Output Summary (Last 24 hours) at 09/28/2017 0843 Last data filed at 09/28/2017 0653 Gross per 24 hour  Intake 1095.6 ml  Output -  Net 1095.6 ml    Physical Exam: Affect appropriate Sleep apnea evident  HEENT: normal Neck supple with no adenopathy JVP normal no bruits no thyromegaly Lungs clear with no wheezing and good diaphragmatic motion Heart:  S1/S2 no murmur, no rub, gallop or click PMI normal Abdomen: benighn, BS positve, no tenderness, no AAA no bruit.  No HSM or HJR Distal pulses intact with no bruits No edema Neuro non-focal Skin tattoo right shoulder  No muscular weakness   Lab Results: Basic Metabolic Panel: Recent Labs    09/26/17 1220  NA 135  K 3.9  CL 100  CO2 22  GLUCOSE 322*  BUN 12  CREATININE 1.14  CALCIUM 9.8  MG 1.7   CBC: Recent Labs    09/26/17 1220  WBC 10.8*  HGB 19.6*  HCT 56.7*  MCV 93.0  PLT 262  Hemoglobin A1C: Recent Labs    09/26/17 1805  HGBA1C 8.5*   Fasting Lipid Panel: No results for input(s): CHOL, HDL, LDLCALC, TRIG, CHOLHDL, LDLDIRECT in the last 72 hours. Thyroid Function Tests: Recent Labs    09/26/17 1805  TSH 1.103   Anemia Panel: No results for input(s): VITAMINB12, FOLATE, FERRITIN, TIBC, IRON, RETICCTPCT in the last 72 hours.  Imaging: Dg Chest Portable 1 View  Result Date: 09/26/2017 CLINICAL DATA:  Chest pain and nausea EXAM: PORTABLE CHEST 1 VIEW COMPARISON:  None. FINDINGS: The heart size and mediastinal contours are within normal limits. Both lungs are clear. The visualized skeletal structures are unremarkable. IMPRESSION: No active disease. Electronically  Signed   By: Alcide Clever M.D.   On: 09/26/2017 12:38    Cardiac Studies:  ECG: afib no acute ischemic changes   Telemetry: afib improved rate control 70-90 bpm  Echo:  EF 50-55% discussed TEE with DR C EF 40-45% diffuse hypokinesis   Medications:   . [MAR Hold] apixaban  5 mg Oral BID  . [MAR Hold] diltiazem  60 mg Oral Q8H  . [MAR Hold] losartan  25 mg Oral Daily  . [MAR Hold] metFORMIN  500 mg Oral Q breakfast     . sodium chloride Stopped (09/28/17 0646)    Assessment/Plan:  Afib:  Failed TEE/DCC No LAA thrombus. Diabetic with low EF will order myovue for am. Start sotolol and consider repeat DCC in 3-4 weeks  HTN:  Improved on ARB DM:  New diagnosis low carb diet started on metformin  OSA:  Likely doubt he would wear CPAP will arrange outpatient sleep study and f/u with Dr Adele Dan [facility] 09/28/2017, 8:43 AM

## 2017-09-28 NOTE — Transfer of Care (Signed)
Immediate Anesthesia Transfer of Care Note  Patient: David [name]  Procedure(s) Performed: TRANSESOPHAGEAL ECHOCARDIOGRAM (TEE) (N/A ) CARDIOVERSION (N/A )  Patient Location: Endoscopy Unit  Anesthesia Type:MAC  Level of Consciousness: awake, alert  and oriented  Airway & Oxygen Therapy: Patient connected to nasal cannula oxygen  Post-op Assessment: Post -op Vital signs reviewed and stable  Post vital signs: stable  Last Vitals:  Vitals Value Taken Time  BP 91/73 09/28/2017  8:45 AM  Temp    Pulse 50 09/28/2017  8:45 AM  Resp 28 09/28/2017  8:45 AM  SpO2 99 % 09/28/2017  8:45 AM  Vitals shown include unvalidated device data.  Last Pain:  Vitals:   09/28/17 0720  TempSrc: Oral  PainSc: 0-No pain         Complications: No apparent anesthesia complications

## 2017-09-28 NOTE — Telephone Encounter (Signed)
-----   Message from Berton Bon, NP sent at 09/28/2017 11:37 AM EDT ----- Regarding: order sleep study Can we please arrange for a sleep study for this pt, new onset atrial fibrillation and mild cardiomyopathy. Also can you arrange for follow up with Dr. Mayford Knife after the study results.   Please let me know if you need me to enter an order or can you take a verbal.   Thanks, Lizabeth Leyden, NP

## 2017-09-28 NOTE — Progress Notes (Signed)
  Echocardiogram Echocardiogram Transesophageal has been performed.  Delcie Roch 09/28/2017, 8:46 AM

## 2017-09-28 NOTE — Interval H&P Note (Signed)
History and Physical Interval Note:  09/28/2017 7:41 AM  [name]  has presented today for surgery, with the diagnosis of afib  The various methods of treatment have been discussed with the patient and family. After consideration of risks, benefits and other options for treatment, the patient has consented to  Procedure(s): TRANSESOPHAGEAL ECHOCARDIOGRAM (TEE) (N/A) CARDIOVERSION (N/A) as a surgical intervention .  The patient's history has been reviewed, patient examined, no change in status, stable for surgery.  I have reviewed the patient's chart and labs.  Questions were answered to the patient's satisfaction.     David [name]

## 2017-09-28 NOTE — Care Management Note (Signed)
Case Management Note  Patient Details  Name: David [name] MRN: [id number] Date of Birth: 1960/11/13  Subjective/Objective: Pt presented for Atrial Fib- Plan for Eliquis at home. Pt is without Insurance and PCP. Currently working and will not get insurance until 6 months. 30 day free Eliquis Card presented to patient.                     Action/Plan: CM did speak with patient in regards to Clinics in [place]- pt is [facility]. Appointment placed on the AVS. Patient can utilize the [facility] for medications and cost will range from $4.00-$10.00. No further needs from CM at this time.   Expected Discharge Date:                  Expected Discharge Plan:  Home/Self Care  In-House Referral:  Financial Counselor  Discharge planning Services  CM Consult, Follow-up appt scheduled, Indigent Health Clinic, Medication Assistance  Post Acute Care Choice:  NA Choice offered to:  NA  DME Arranged:  N/A DME Agency:  NA  HH Arranged:  NA HH Agency:  NA  Status of Service:  Completed, signed off  If discussed at Long Length of Stay Meetings, dates discussed:    Additional Comments:  Gala Lewandowsky, RN 09/28/2017, 2:36 PM

## 2017-09-28 NOTE — Op Note (Signed)
INDICATIONS: atrial fibrillation  PROCEDURE:   Informed consent was obtained prior to the procedure. The risks, benefits and alternatives for the procedure were discussed and the patient comprehended these risks.  Risks include, but are not limited to, cough, sore throat, vomiting, nausea, somnolence, esophageal and stomach trauma or perforation, bleeding, low blood pressure, aspiration, pneumonia, infection, trauma to the teeth and death.    After a procedural time-out, the oropharynx was anesthetized with 20% benzocaine spray.   During this procedure the patient was administered IV propofol by Anesthesiology.  The transesophageal probe was inserted in the esophagus and stomach without difficulty and multiple views were obtained.  The patient was kept under observation until the patient left the procedure room.  The patient left the procedure room in stable condition.   Agitated microbubble saline contrast was not administered.  COMPLICATIONS:    There were no immediate complications.  FINDINGS:  No LA thrombus, but there is severe spontaneous echo contrast. LVEF depressed globally, 45%. No significant valvular abnormalities.  RECOMMENDATIONS:   Evaluate for OSA. Proceed with DCCV.  Time Spent Directly with the Patient:  30 minutes   Kymani Laursen 09/28/2017, 8:33 AM

## 2017-09-28 NOTE — Op Note (Signed)
Procedure: Electrical Cardioversion Indications:  Atrial Fibrillation  Procedure Details:  Consent: Risks of procedure as well as the alternatives and risks of each were explained to the (patient/caregiver).  Consent for procedure obtained.  Time Out: Verified patient identification, verified procedure, site/side was marked, verified correct patient position, special equipment/implants available, medications/allergies/relevent history reviewed, required imaging and test results available.  Performed  Patient placed on cardiac monitor, pulse oximetry, supplemental oxygen as necessary.  Sedation given: IV propofol, Dr. Chilton Si Pacer pads placed anterior and posterior chest.  Cardioverted 3 time(s).  Cardioversion with synchronized biphasic 120J, 200J, repeat 200J shock, including holding pressure on anterior pad..  Evaluation: Findings: Post procedure EKG shows: Atrial Fibrillation Complications: None Patient did tolerate procedure well.  Treat medically for LV dysfunction, evaluate and treat suspected OSA and consider repeat DCCV after initiation of antiarrhythmic therapy.  Time Spent Directly with the Patient:  30 minutes   David [name] 09/28/2017, 8:37 AM

## 2017-09-28 NOTE — Progress Notes (Addendum)
Inpatient Diabetes Program Recommendations  AACE/ADA: New Consensus Statement on Inpatient Glycemic Control (2015)  Target Ranges:  Prepandial:   less than 140 mg/dL      Peak postprandial:   less than 180 mg/dL (1-2 hours)      Critically ill patients:  140 - 180 mg/dL   Lab Results  Component Value Date   GLUCAP 227 (H) 09/28/2017   HGBA1C 8.5 (H) 09/26/2017   Review of Glycemic Control  Diabetes history: New diagnosis Current orders for Inpatient glycemic control: Metformin 500 mg Daily  Inpatient Diabetes Program Recommendations:    While inpatient consider Novolog Sensitive Correction 0-9 units tid + Novolog HS scale 0-5 units.  Noted glucose trend still elevated in the 200's while here. Patient is usually very active with his job during the day.   Could increase Metformin to 500 mg BID for full 24 hour coverage. Patient does report his mom could not tolerate Metformin.  Thanks,  Christena Deem RN, MSN, BC-ADM, [facility] Inpatient Diabetes Coordinator Team Pager [phone] (8a-5p)

## 2017-09-29 ENCOUNTER — Inpatient Hospital Stay (HOSPITAL_COMMUNITY): Payer: Self-pay

## 2017-09-29 ENCOUNTER — Telehealth: Payer: Self-pay | Admitting: *Deleted

## 2017-09-29 DIAGNOSIS — I4891 Unspecified atrial fibrillation: Secondary | ICD-10-CM

## 2017-09-29 LAB — GLUCOSE, CAPILLARY
GLUCOSE-CAPILLARY: 201 mg/dL — AB (ref 70–99)
GLUCOSE-CAPILLARY: 221 mg/dL — AB (ref 70–99)
Glucose-Capillary: 217 mg/dL — ABNORMAL HIGH (ref 70–99)
Glucose-Capillary: 166 mg/dL — ABNORMAL HIGH (ref 70–99)

## 2017-09-29 LAB — NM MYOCAR MULTI W/SPECT W/WALL MOTION / EF
CHL CUP RESTING HR STRESS: 86 {beats}/min
CSEPEW: 1 METS
CSEPPHR: 97 {beats}/min
Exercise duration (min): 5 min
Exercise duration (sec): 15 s

## 2017-09-29 MED ORDER — TECHNETIUM TC 99M TETROFOSMIN IV KIT
10.0000 | PACK | Freq: Once | INTRAVENOUS | Status: AC | PRN
  Administered 2017-09-29: 10 via INTRAVENOUS

## 2017-09-29 MED ORDER — REGADENOSON 0.4 MG/5ML IV SOLN
INTRAVENOUS | Status: AC
  Filled 2017-09-29: qty 5

## 2017-09-29 MED ORDER — REGADENOSON 0.4 MG/5ML IV SOLN
0.4000 mg | Freq: Once | INTRAVENOUS | Status: AC
  Administered 2017-09-29: 0.4 mg via INTRAVENOUS
  Filled 2017-09-29: qty 5

## 2017-09-29 MED ORDER — TECHNETIUM TC 99M TETROFOSMIN IV KIT
30.0000 | PACK | Freq: Once | INTRAVENOUS | Status: AC | PRN
  Administered 2017-09-29: 30 via INTRAVENOUS

## 2017-09-29 NOTE — Progress Notes (Signed)
Patient presented for stress test. Changed to [facility] as he got his Cardizem this morning. Tolerated procedure well. Pending final stress imaging result.

## 2017-09-29 NOTE — Progress Notes (Signed)
Inpatient Diabetes Program Recommendations  AACE/ADA: New Consensus Statement on Inpatient Glycemic Control (2015)  Target Ranges:  Prepandial:   less than 140 mg/dL      Peak postprandial:   less than 180 mg/dL (1-2 hours)      Critically ill patients:  140 - 180 mg/dL   Review of Glycemic Control  Diabetes history: New diagnosis Current orders for Inpatient glycemic control: Novolog Sensitive Correction 0-9 units tid, Novolog HS scale 0-5 units, Metformin 500 mg Daily  Inpatient Diabetes Program Recommendations:    Noted glucose trend still elevated in the 200's while here.   Could increase Metformin to 500 mg BID for full 24 hour coverage. Patient does report his mom could not tolerate Metformin.  Thanks,  Christena Deem RN, MSN, BC-ADM, [facility] Inpatient Diabetes Coordinator Team Pager [phone] (8a-5p)

## 2017-09-29 NOTE — Progress Notes (Signed)
Reviewed myovue There is a normalization artifact with resting images having more counts No infarct ? Mild anterolateral ischemia  Read as intermediate risk due to EF 44% which we know from echo  And this is likely from rapid afib and HTN  Favor continued Rx for afib and anticoagulation with Rx HTN in absence of any chest pain Continue sotolol and plan [facility] attempt again in 3 - 4 weeks  Charlton Haws

## 2017-09-29 NOTE — Telephone Encounter (Signed)
Staff message sent to  [place] according to chart patient has no current insurance. Medicaid is pending. Notify ordering provider. They may want to change in lab request to HST due to $. Or wait until Medicaid is approved. Once Medicaid has been approved, sleep study can be scheduled. Does not require pre cert.

## 2017-09-29 NOTE — Progress Notes (Signed)
    Subjective:  No complaints   Objective:  Vitals:   09/28/17 1414 09/28/17 2144 09/28/17 2225 09/29/17 0614  BP: 127/85 (!) 144/107 (!) 150/102 (!) 135/102  Pulse: 66 (!) 101  (!) 104  Resp:  19  18  Temp: 98.3 F (36.8 C) 99.2 F (37.3 C)  98.1 F (36.7 C)  TempSrc: Oral Oral  Oral  SpO2: 97% 98%  96%  Weight:    194 lb 6.4 oz (88.2 kg)  Height:        Intake/Output from previous day: No intake or output data in the 24 hours ending 09/29/17 0757  Physical Exam: Affect appropriate Sleep apnea evident  HEENT: normal Neck supple with no adenopathy JVP normal no bruits no thyromegaly Lungs clear with no wheezing and good diaphragmatic motion Heart:  S1/S2 no murmur, no rub, gallop or click PMI normal Abdomen: benighn, BS positve, no tenderness, no AAA no bruit.  No HSM or HJR Distal pulses intact with no bruits No edema Neuro non-focal Skin tattoo right shoulder  No muscular weakness   Lab Results: Basic Metabolic Panel: Recent Labs    09/26/17 1220  NA 135  K 3.9  CL 100  CO2 22  GLUCOSE 322*  BUN 12  CREATININE 1.14  CALCIUM 9.8  MG 1.7   CBC: Recent Labs    09/26/17 1220  WBC 10.8*  HGB 19.6*  HCT 56.7*  MCV 93.0  PLT 262  Hemoglobin A1C: Recent Labs    09/26/17 1805  HGBA1C 8.5*   Fasting Lipid Panel: No results for input(s): CHOL, HDL, LDLCALC, TRIG, CHOLHDL, LDLDIRECT in the last 72 hours. Thyroid Function Tests: Recent Labs    09/26/17 1805  TSH 1.103   Anemia Panel: No results for input(s): VITAMINB12, FOLATE, FERRITIN, TIBC, IRON, RETICCTPCT in the last 72 hours.  Imaging: No results found.  Cardiac Studies:  ECG: afib no acute ischemic changes   Telemetry: afib improved rate control 70-90 bpm  Echo:  EF 50-55% discussed TEE with DR C EF 40-45% diffuse hypokinesis   Medications:   . apixaban  5 mg Oral BID  . diltiazem  30 mg Oral Q8H  . insulin aspart  0-5 Units Subcutaneous QHS  . insulin aspart  0-9 Units  Subcutaneous TID WC  . losartan  25 mg Oral Daily  . metFORMIN  500 mg Oral Q breakfast  . sotalol  80 mg Oral Q12H       Assessment/Plan:  Afib:  Failed TEE/DCC No LAA thrombus. Diabetic with low EF Myovue today to risk stratify for CAD Day 2 sotolol QT ok Plan d/c in am if myovue low risk and QT ok Plan DCC in 4 weeks HTN:  Improved on ARB DM:  New diagnosis low carb diet started on metformin needs primary care f/u for this A1c 8.5 OSA:  Likely doubt he would wear CPAP will arrange outpatient sleep study and f/u with Dr Adele Dan [facility] 09/29/2017, 7:57 AM

## 2017-09-29 NOTE — Telephone Encounter (Signed)
-----   Message from Reesa Chew, CMA sent at 09/28/2017  1:12 PM EDT ----- Regarding: pre cert Split night ----- Message ----- From: Berton Bon, NP Sent: 09/28/2017  11:37 AM To: Reesa Chew, CMA Subject: order sleep study                              Can we please arrange for a sleep study for this pt, new onset atrial fibrillation and mild cardiomyopathy. Also can you arrange for follow up with Dr. Mayford Knife after the study results.   Please let me know if you need me to enter an order or can you take a verbal.   Thanks, Lizabeth Leyden, NP

## 2017-09-30 ENCOUNTER — Other Ambulatory Visit: Payer: Self-pay

## 2017-09-30 ENCOUNTER — Encounter (HOSPITAL_COMMUNITY): Payer: Self-pay | Admitting: Cardiology

## 2017-09-30 DIAGNOSIS — Z7901 Long term (current) use of anticoagulants: Secondary | ICD-10-CM

## 2017-09-30 DIAGNOSIS — G4730 Sleep apnea, unspecified: Secondary | ICD-10-CM | POA: Diagnosis present

## 2017-09-30 DIAGNOSIS — F17200 Nicotine dependence, unspecified, uncomplicated: Secondary | ICD-10-CM | POA: Diagnosis present

## 2017-09-30 DIAGNOSIS — I429 Cardiomyopathy, unspecified: Secondary | ICD-10-CM

## 2017-09-30 HISTORY — DX: Cardiomyopathy, unspecified: I42.9

## 2017-09-30 HISTORY — DX: Long term (current) use of anticoagulants: Z79.01

## 2017-09-30 HISTORY — DX: Sleep apnea, unspecified: G47.30

## 2017-09-30 LAB — GLUCOSE, CAPILLARY
GLUCOSE-CAPILLARY: 203 mg/dL — AB (ref 70–99)
GLUCOSE-CAPILLARY: 241 mg/dL — AB (ref 70–99)

## 2017-09-30 MED ORDER — APIXABAN 5 MG PO TABS: 5.0000 mg | ORAL_TABLET | Freq: Two times a day (BID) | ORAL | 11 refills | Status: DC

## 2017-09-30 MED ORDER — ACETAMINOPHEN 325 MG PO TABS: 650.0000 mg | ORAL_TABLET | ORAL | Status: DC | PRN

## 2017-09-30 MED ORDER — SOTALOL HCL 80 MG PO TABS: 80.0000 mg | ORAL_TABLET | Freq: Two times a day (BID) | ORAL | 11 refills | Status: DC

## 2017-09-30 MED ORDER — LOSARTAN POTASSIUM 25 MG PO TABS: 25.0000 mg | ORAL_TABLET | Freq: Every day | ORAL | 11 refills | Status: DC

## 2017-09-30 MED ORDER — METFORMIN HCL 500 MG PO TABS: 500.0000 mg | ORAL_TABLET | Freq: Every day | ORAL | 11 refills | Status: DC

## 2017-09-30 MED ORDER — DILTIAZEM HCL ER COATED BEADS 120 MG PO TB24: 120.0000 mg | ORAL_TABLET | Freq: Every day | ORAL | 11 refills | Status: DC

## 2017-09-30 MED ORDER — DILTIAZEM HCL ER COATED BEADS 120 MG PO TB24
120.0000 mg | ORAL_TABLET | Freq: Every day | ORAL | Status: DC
  Administered 2017-09-30: 120 mg via ORAL
  Filled 2017-09-30 (×3): qty 1

## 2017-09-30 NOTE — Progress Notes (Signed)
DAILY PROGRESS NOTE   Patient Name: David [name] Date of Encounter: 09/30/2017  Chief Complaint   No complaints, wants to go home  Patient Profile   57 yo male with a-fib with failed cardioversion, now on sotalol.  Subjective   No complaints. Remains in afib. QTc 462 msec. Will have had 5 doses this morning.   Objective   Vitals:   09/29/17 1409 09/29/17 2142 09/30/17 0458 09/30/17 0502  BP: (!) 161/116 (!) 163/114 (!) 147/113   Pulse: 86 96 73   Resp: 20 (!) 24    Temp: 98.4 F (36.9 C) 98.7 F (37.1 C) 98.4 F (36.9 C)   TempSrc: Oral Oral Oral   SpO2: 98% 98% 98%   Weight:    190 lb 14.4 oz (86.6 kg)  Height:        Intake/Output Summary (Last 24 hours) at 09/30/2017 1210 Last data filed at 09/30/2017 0916 Gross per 24 hour  Intake 600 ml  Output 500 ml  Net 100 ml   Filed Weights   09/28/17 0551 09/29/17 0614 09/30/17 0502  Weight: 195 lb 9.6 oz (88.7 kg) 194 lb 6.4 oz (88.2 kg) 190 lb 14.4 oz (86.6 kg)    Physical Exam   General appearance: alert and no distress Lungs: clear to auscultation bilaterally Heart: regular rate and rhythm, S1, S2 normal, no murmur, click, rub or gallop Extremities: extremities normal, atraumatic, no cyanosis or edema Neurologic: Grossly normal  Inpatient Medications    Scheduled Meds: . apixaban  5 mg Oral BID  . diltiazem  30 mg Oral Q8H  . insulin aspart  0-5 Units Subcutaneous QHS  . insulin aspart  0-9 Units Subcutaneous TID WC  . losartan  25 mg Oral Daily  . metFORMIN  500 mg Oral Q breakfast  . sotalol  80 mg Oral Q12H    Continuous Infusions:   PRN Meds: acetaminophen, hydrocortisone cream, LORazepam, ondansetron (ZOFRAN) IV   Labs   Results for orders placed or performed during the hospital encounter of 09/26/17 (from the past 48 hour(s))  Glucose, capillary     Status: Abnormal   Collection Time: 09/28/17  4:21 PM  Result Value Ref Range   Glucose-Capillary 166 (H) 70 - 99 mg/dL  Glucose,  capillary     Status: Abnormal   Collection Time: 09/28/17  9:41 PM  Result Value Ref Range   Glucose-Capillary 238 (H) 70 - 99 mg/dL  Glucose, capillary     Status: Abnormal   Collection Time: 09/29/17  7:45 AM  Result Value Ref Range   Glucose-Capillary 217 (H) 70 - 99 mg/dL  Glucose, capillary     Status: Abnormal   Collection Time: 09/29/17 11:26 AM  Result Value Ref Range   Glucose-Capillary 166 (H) 70 - 99 mg/dL  Glucose, capillary     Status: Abnormal   Collection Time: 09/29/17  4:52 PM  Result Value Ref Range   Glucose-Capillary 201 (H) 70 - 99 mg/dL  Glucose, capillary     Status: Abnormal   Collection Time: 09/29/17  9:45 PM  Result Value Ref Range   Glucose-Capillary 221 (H) 70 - 99 mg/dL  Glucose, capillary     Status: Abnormal   Collection Time: 09/30/17  7:33 AM  Result Value Ref Range   Glucose-Capillary 203 (H) 70 - 99 mg/dL  Glucose, capillary     Status: Abnormal   Collection Time: 09/30/17 11:45 AM  Result Value Ref Range   Glucose-Capillary 241 (H) 70 -  99 mg/dL    ECG   A-fib at 74, NS T wave changes, QTc 462 msec  - Personally Reviewed  Telemetry   A-fib with CVR - Personally Reviewed  Radiology    Nm Myocar Multi W/spect W/wall Motion / Ef  Result Date: 09/29/2017 CLINICAL DATA:  57 year old with new onset atrial fibrillation. Evaluate for ischemia. EXAM: MYOCARDIAL IMAGING WITH SPECT (REST AND PHARMACOLOGIC-STRESS) GATED LEFT VENTRICULAR WALL MOTION STUDY LEFT VENTRICULAR EJECTION FRACTION TECHNIQUE: Standard myocardial SPECT imaging was performed after resting intravenous injection of 10 mCi Tc-75m tetrofosmin. Subsequently, intravenous infusion of Lexiscan was performed under the supervision of the Cardiology staff. At peak effect of the drug, 30 mCi Tc-69m tetrofosmin was injected intravenously and standard myocardial SPECT imaging was performed. Quantitative gated imaging was also performed to evaluate left ventricular wall motion, and estimate  left ventricular ejection fraction. COMPARISON:  Chest radiograph 09/26/2017 FINDINGS: Perfusion: No evidence for fixed defect or infarct. There may be mild reversibility along the anterolateral wall in the mid segment. This area is equivocal. No other areas are concerning for reversible ischemia. Wall Motion: Diffuse hypokinesia without focal abnormality. Left Ventricular Ejection Fraction: 44 % End diastolic volume 148 ml End systolic volume 83 ml IMPRESSION: 1. Mild reversibility along the anterolateral wall in the mid segment is an equivocal finding. No other areas are concerning for reversible ischemia. 2. Mild diffuse hypokinesia throughout the left ventricle. 3. Left ventricular ejection fraction is 44%. 4. Non invasive risk stratification*: Intermediate *2012 Appropriate Use Criteria for Coronary Revascularization Focused Update: J Am Coll Cardiol. 2012;59(9):857-881. http://content.dementiazones.com.aspx?articleid=[email or web address] Electronically Signed   By: Richarda Overlie M.D.   On: 09/29/2017 12:04    Cardiac Studies   MYOCARDIAL IMAGING WITH SPECT (REST AND PHARMACOLOGIC-STRESS)  GATED LEFT VENTRICULAR WALL MOTION STUDY  LEFT VENTRICULAR EJECTION FRACTION  TECHNIQUE: Standard myocardial SPECT imaging was performed after resting intravenous injection of 10 mCi Tc-76m tetrofosmin. Subsequently, intravenous infusion of Lexiscan was performed under the supervision of the Cardiology staff. At peak effect of the drug, 30 mCi Tc-68m tetrofosmin was injected intravenously and standard myocardial SPECT imaging was performed. Quantitative gated imaging was also performed to evaluate left ventricular wall motion, and estimate left ventricular ejection fraction.  COMPARISON:  Chest radiograph 09/26/2017  FINDINGS: Perfusion: No evidence for fixed defect or infarct. There may be mild reversibility along the anterolateral wall in the mid segment. This area is equivocal. No other areas are  concerning for reversible ischemia.  Wall Motion: Diffuse hypokinesia without focal abnormality.  Left Ventricular Ejection Fraction: 44 %  End diastolic volume 148 ml  End systolic volume 83 ml  IMPRESSION: 1. Mild reversibility along the anterolateral wall in the mid segment is an equivocal finding. No other areas are concerning for reversible ischemia.  2. Mild diffuse hypokinesia throughout the left ventricle.  3. Left ventricular ejection fraction is 44%.  4. Non invasive risk stratification*: Intermediate  *2012 Appropriate Use Criteria for Coronary Revascularization Focused Update: J Am Coll Cardiol. 2012;59(9):857-881. http://content.dementiazones.com.aspx?articleid=1201161   Electronically Signed   By: Richarda Overlie M.D.   On: 09/29/2017 12:04  Assessment   1. Active Problems: 2.   A-fib (HCC) 3.   HTN (hypertension) 4.   Elevated glucose 5.   Atrial fibrillation (HCC) 6.   Plan   1. Stable rate-controlled a-fib on sotalol and diltiazem. Change diltiazem to 120 mg LA daily. Qtc stable - will have had 5 doses this morning. Anticoagulated on Eliquis. Ok for d/c home today. Follow-up in 3-4 weeks  with [facility] for outpatient DCCV.  Time Spent Directly with Patient:  I have spent a total of 15 minutes with the patient reviewing hospital notes, telemetry, EKGs, labs and examining the patient as well as establishing an assessment and plan that was discussed personally with the patient.  > 50% of time was spent in direct patient care.  Length of Stay:  LOS: 4 days   Chrystie Nose, MD, [facility], FACP  [place]  [facility]  Medical Director of the [facility] Lipid Disorders &  Cardiovascular Risk Reduction Clinic Diplomate of the American Board of Clinical Lipidology Attending Cardiologist  Direct Dial: [phone]  Fax: [phone]  Website:  [email or web address] 09/30/2017, 12:10 PM

## 2017-09-30 NOTE — Discharge Summary (Addendum)
Discharge Summary    Patient ID: David [name],  MRN: [id number], DOB/AGE: [birth year] 57 y.o.  Admit date: 09/26/2017 Discharge date: 09/30/2017  Primary Care Provider: Patient, No Pcp Per Primary Cardiologist: Dr Eden Emms  Discharge Diagnoses    Principal Problem:   Atrial fibrillation with RVR [facility]) Active Problems:   Essential hypertension   Non-insulin treated type 2 diabetes mellitus (HCC)   Chronic anticoagulation   Smoker   Cardiomyopathy (HCC)   Sleep apnea suspected   Allergies No Known Allergies  Diagnostic Studies/Procedures    Echo 09/27/17 TEE 09/28/17 Attempted DCCV 09/28/17 Myoview 09/29/17 _____________   History of Present Illness     57 y/o from [place], recently moved to this area and no PCP, is on no medications. He presented to there ED with fatigue and was found to be in AF with RVR.   Hospital Course     Pt was admitted and started on medical therapy for AF. He was also noted to be hypertensive and had an elevated BS. Sleep apnea is also suspected. He had an echo 6/26 that showed persevered LVF with an EF of 50-55%. TEE cardioversion was attempted 6/27 but was unsuccessful. His EF at TEE was down- 45%. The pt had a Myoview 6/28 and was placed on Sotalol. His Myoview was low risk. He received 5 doses of Sotalol and his QT is stable-462. His rate is stable, in the 70's. He will be discharged with plans for a f/u in 3-4 weeks and possible repeat DCCV on Sotalol. He will also be contacted about an OP sleep study.  _____________  Discharge Vitals Blood pressure (!) 165/104, pulse 73, temperature 98.4 F (36.9 C), temperature source Oral, resp. rate (!) 24, height 6' (1.829 m), weight 190 lb 14.4 oz (86.6 kg), SpO2 98 %.  Filed Weights   09/28/17 0551 09/29/17 0614 09/30/17 0502  Weight: 195 lb 9.6 oz (88.7 kg) 194 lb 6.4 oz (88.2 kg) 190 lb 14.4 oz (86.6 kg)    Labs & Radiologic Studies    CBC No results for input(s): WBC, NEUTROABS, HGB, HCT, MCV, PLT in  the last 72 hours. Basic Metabolic Panel No results for input(s): NA, K, CL, CO2, GLUCOSE, BUN, CREATININE, CALCIUM, MG, PHOS in the last 72 hours. Liver Function Tests No results for input(s): AST, ALT, ALKPHOS, BILITOT, PROT, ALBUMIN in the last 72 hours. No results for input(s): LIPASE, AMYLASE in the last 72 hours. Cardiac Enzymes No results for input(s): CKTOTAL, CKMB, CKMBINDEX, TROPONINI in the last 72 hours. BNP Invalid input(s): POCBNP D-Dimer No results for input(s): DDIMER in the last 72 hours. Hemoglobin A1C No results for input(s): HGBA1C in the last 72 hours. Fasting Lipid Panel No results for input(s): CHOL, HDL, LDLCALC, TRIG, CHOLHDL, LDLDIRECT in the last 72 hours. Thyroid Function Tests No results for input(s): TSH, T4TOTAL, T3FREE, THYROIDAB in the last 72 hours.  Invalid input(s): FREET3 _____________  Nm Myocar Multi W/spect W/wall Motion / Ef  Result Date: 09/29/2017 CLINICAL DATA:  57 year old with new onset atrial fibrillation. Evaluate for ischemia. EXAM: MYOCARDIAL IMAGING WITH SPECT (REST AND PHARMACOLOGIC-STRESS) GATED LEFT VENTRICULAR WALL MOTION STUDY LEFT VENTRICULAR EJECTION FRACTION TECHNIQUE: Standard myocardial SPECT imaging was performed after resting intravenous injection of 10 mCi Tc-78m tetrofosmin. Subsequently, intravenous infusion of Lexiscan was performed under the supervision of the Cardiology staff. At peak effect of the drug, 30 mCi Tc-92m tetrofosmin was injected intravenously and standard myocardial SPECT imaging was performed. Quantitative gated imaging was also performed  to evaluate left ventricular wall motion, and estimate left ventricular ejection fraction. COMPARISON:  Chest radiograph 09/26/2017 FINDINGS: Perfusion: No evidence for fixed defect or infarct. There may be mild reversibility along the anterolateral wall in the mid segment. This area is equivocal. No other areas are concerning for reversible ischemia. Wall Motion: Diffuse  hypokinesia without focal abnormality. Left Ventricular Ejection Fraction: 44 % End diastolic volume 148 ml End systolic volume 83 ml IMPRESSION: 1. Mild reversibility along the anterolateral wall in the mid segment is an equivocal finding. No other areas are concerning for reversible ischemia. 2. Mild diffuse hypokinesia throughout the left ventricle. 3. Left ventricular ejection fraction is 44%. 4. Non invasive risk stratification*: Intermediate *2012 Appropriate Use Criteria for Coronary Revascularization Focused Update: J Am Coll Cardiol. 2012;59(9):857-881. http://content.dementiazones.com.aspx?articleid=[email or web address] Electronically Signed   By: Richarda Overlie M.D.   On: 09/29/2017 12:04   Dg Chest Portable 1 View  Result Date: 09/26/2017 CLINICAL DATA:  Chest pain and nausea EXAM: PORTABLE CHEST 1 VIEW COMPARISON:  None. FINDINGS: The heart size and mediastinal contours are within normal limits. Both lungs are clear. The visualized skeletal structures are unremarkable. IMPRESSION: No active disease. Electronically Signed   By: Alcide Clever M.D.   On: 09/26/2017 12:38   Disposition   Pt is being discharged home today in good condition.  Follow-up Plans & Appointments    Follow-up Information    [facility] Follow up on 10/16/2017.   Specialty:  Internal Medicine Why:  @ 1:00 pm with Raliegh Ip NP for hospital follow up. Please call the office if you can not make this scheduled appointment.  Contact information: [address] [place] [address] [phone]       [facility]. Go to.   Why:  this location for pharmacy assistance with medications. Cost will range from $4.00-$10.00 Contact information: [address] [place] [phone]       Wendall Stade, MD Follow up.   Specialty:  Cardiology Why:  office will contact you Contact information: [address] [place] [address] [phone]            Discharge Medications   Allergies as of 09/30/2017   No Known Allergies     Medication List    TAKE these medications   acetaminophen 325 MG tablet Commonly known as:  TYLENOL Take 2 tablets (650 mg total) by mouth every 4 (four) hours as needed for headache or mild pain.   apixaban 5 MG Tabs tablet Commonly known as:  ELIQUIS Take 1 tablet (5 mg total) by mouth 2 (two) times daily.   diltiazem 120 MG 24 hr tablet Commonly known as:  CARDIZEM LA Take 1 tablet (120 mg total) by mouth daily.   losartan 25 MG tablet Commonly known as:  COZAAR Take 1 tablet (25 mg total) by mouth daily. Start taking on:  10/01/2017   metFORMIN 500 MG tablet Commonly known as:  GLUCOPHAGE Take 1 tablet (500 mg total) by mouth daily with breakfast. Start taking on:  10/01/2017   sotalol 80 MG tablet Commonly known as:  BETAPACE Take 1 tablet (80 mg total) by mouth every 12 (twelve) hours.        Acute coronary syndrome (MI, NSTEMI, STEMI, etc) this admission?: No.    Outstanding Labs/Studies    Duration of Discharge Encounter   Greater than 30 minutes including physician time.  Signed, Franky Macho  [address] [place], [place] 09/30/2017, 1:12 PM

## 2017-09-30 NOTE — Discharge Instructions (Signed)
Atrial Fibrillation Atrial fibrillation is a type of heartbeat that is irregular or fast (rapid). If you have this condition, your heart keeps quivering in a weird (chaotic) way. This condition can make it so your heart cannot pump blood normally. Having this condition gives a person more risk for stroke, heart failure, and other heart problems. There are different types of atrial fibrillation. Talk with your doctor to learn about the type that you have. Follow these instructions at home:  Take over-the-counter and prescription medicines only as told by your doctor.  If your doctor prescribed a blood-thinning medicine, take it exactly as told. Taking too much of it can cause bleeding. If you do not take enough of it, you will not have the protection that you need against stroke and other problems.  Do not use any tobacco products. These include cigarettes, chewing tobacco, and e-cigarettes. If you need help quitting, ask your doctor.  If you have apnea (obstructive sleep apnea), manage it as told by your doctor.  Do not drink alcohol.  Do not drink beverages that have caffeine. These include coffee, soda, and tea.  Maintain a healthy weight. Do not use diet pills unless your doctor says they are safe for you. Diet pills may make heart problems worse.  Follow diet instructions as told by your doctor.  Exercise regularly as told by your doctor.  Keep all follow-up visits as told by your doctor. This is important. Contact a doctor if:  You notice a change in the speed, rhythm, or strength of your heartbeat.  You are taking a blood-thinning medicine and you notice more bruising.  You get tired more easily when you move or exercise. Get help right away if:  You have pain in your chest or your belly (abdomen).  You have sweating or weakness.  You feel sick to your stomach (nauseous).  You notice blood in your throw up (vomit), poop (stool), or pee (urine).  You are short of  breath.  You suddenly have swollen feet and ankles.  You feel dizzy.  Your suddenly get weak or numb in your face, arms, or legs, especially if it happens on one side of your body.  You have trouble talking, trouble understanding, or both.  Your face or your eyelid droops on one side. These symptoms may be an emergency. Do not wait to see if the symptoms will go away. Get medical help right away. Call your local emergency services (911 in the [place] Do not drive yourself to the hospital. This information is not intended to replace advice given to you by your health care provider. Make sure you discuss any questions you have with your health care provider. Document Released: 12/29/2007 Document Revised: 08/27/2015 Document Reviewed: 07/16/2014 Elsevier Interactive Patient Education  2018 [employer]. Information on my medicine - ELIQUIS (apixaban)  This medication education was reviewed with me or my healthcare representative as part of my discharge preparation.   Why was Eliquis prescribed for you? Eliquis was prescribed for you to reduce the risk of forming blood clots that can cause a stroke since you have a medical condition called atrial fibrillation (a type of irregular heartbeat).  What do You need to know about Eliquis ? Take your Eliquis TWICE DAILY - one tablet in the morning and one tablet in the evening with or without food.  It would be best to take the doses about the same time each day.  If you have difficulty swallowing the tablet whole please  discuss with your pharmacist how to take the medication safely.  Take Eliquis exactly as prescribed by your doctor and DO NOT stop taking Eliquis without talking to the doctor who prescribed the medication.  Stopping may increase your risk of developing a new clot or stroke.  Refill your prescription before you run out.  After discharge, you should have regular check-up appointments with your healthcare provider that is  prescribing your Eliquis.  In the future your dose may need to be changed if your kidney function or weight changes by a significant amount or as you get older.  What do you do if you miss a dose? If you miss a dose, take it as soon as you remember on the same day and resume taking twice daily.  Do not take more than one dose of ELIQUIS at the same time.  Important Safety Information A possible side effect of Eliquis is bleeding. You should call your healthcare provider right away if you experience any of the following: ? Bleeding from an injury or your nose that does not stop. ? Unusual colored urine (red or dark brown) or unusual colored stools (red or black). ? Unusual bruising for unknown reasons. ? A serious fall or if you hit your head (even if there is no bleeding).  Some medicines may interact with Eliquis and might increase your risk of bleeding or clotting while on Eliquis. To help avoid this, consult your healthcare provider or pharmacist prior to using any new prescription or non-prescription medications, including herbals, vitamins, non-steroidal anti-inflammatory drugs (NSAIDs) and supplements.  This website has more information on Eliquis (apixaban): www.[employer].com.cy.

## 2017-10-02 MED FILL — SOTALOL HCL 80 MG TAB: 80 | 30 days supply | Qty: 60 | Fill #0

## 2017-10-02 MED FILL — ELIQUIS 5 MG TABLET: 5 | 30 days supply | Qty: 60 | Fill #0

## 2017-10-02 MED FILL — LOSARTAN POTASSIUM 25 MG TA: 25 | 30 days supply | Qty: 30 | Fill #0

## 2017-10-02 MED FILL — CARDIZEM LA 120 MG TABLET: 120 | 30 days supply | Qty: 30 | Fill #0

## 2017-10-02 MED FILL — metFORMIN HCL 500 MG TABS: 500 | 30 days supply | Qty: 30 | Fill #0

## 2017-10-02 NOTE — Telephone Encounter (Addendum)
Reached out to patient to ask if he had new insurance.   Left detailed message on voicemail to call back and discuss if he had alternate insurance or if he knew when his medicaid would be finalized and at that time we can reschedule his sleep study. Will also send a message to the ordering provider Berton Bon.

## 2017-10-02 NOTE — Telephone Encounter (Signed)
  Gaynelle Cage, CMA  Reesa Chew, CMA        According to the patient's chart there is no current insurance. Medicaid is pending which doesn't need pre cert. Inform ordering provider of insurance situation.

## 2017-10-04 NOTE — Telephone Encounter (Signed)
Reached back out to the patient to check on his alternate insurance and patient states he does not have any insurance. Patient will call back when he obtains insurance coverage.

## 2017-10-16 ENCOUNTER — Ambulatory Visit: Payer: Self-pay | Admitting: Family Medicine

## 2017-11-01 MED FILL — LOSARTAN POTASSIUM 25 MG TA: 25 | 30 days supply | Qty: 30 | Fill #1

## 2017-11-01 MED FILL — metFORMIN HCL 500 MG TABS: 500 | 30 days supply | Qty: 30 | Fill #1

## 2017-11-01 MED FILL — !ELIQUIS 5 MG TABLET: 5 | 30 days supply | Qty: 60 | Fill #1

## 2017-11-01 MED FILL — SOTALOL HCL 80 MG TAB: 80 | 30 days supply | Qty: 60 | Fill #1

## 2017-11-01 MED FILL — CARDIZEM LA 120 MG TABLET: 120 | 30 days supply | Qty: 30 | Fill #1

## 2017-11-03 ENCOUNTER — Ambulatory Visit: Payer: Self-pay | Admitting: Family Medicine

## 2017-11-23 NOTE — Progress Notes (Deleted)
Cardiology Office Note   Date:  11/23/2017   ID:  David [name], DOB May 09, 1960, MRN [id number]  PCP:  Patient, No Pcp Per  Cardiologist:  Dr. Eden Emms    No chief complaint on file.     History of Present Illness: David [name] is a 57 y.o. male who presents for a fib   Moved from [place] about 2 years ago after marital problems. 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 Felt poorly for 2 months. Fatigue , Nausea, palpitations. Some dyspnea as well. Takes no meds. Rare smoking and rare ETOH. Denies symptoms of DM but BS 323 on admission . Denies chronic lung issues but Hct elevated on admission. In ER found to be in rapid afib and quite HTN.    This patients CHA2DS2-VASc Score and unadjusted Ischemic Stroke Rate (% per year) is equal to 2.2 % stroke rate/year from a score of 2  No LA thrombus, but there is severe spontaneous echo contrast. LVEF depressed globally, 45%. No significant valvular abnormalities.  DCCV Treat medically for LV dysfunction, evaluate and treat suspected OSA and consider repeat DCCV after initiation of antiarrhythmic therapy  neededs outpt sleep study   Placed on sotalol.  DCCV needs to be arranged.   Past Medical History:  Diagnosis Date  . Atrial fibrillation (HCC) 09/26/2017  . Atrial fibrillation with RVR (HCC) 09/26/2017   Presented 09/26/17 to ED and found to be in AF with RVR  . Cardiomyopathy (HCC) 09/30/2017   Myoview low risk-EF 45%  . Chronic anticoagulation 09/30/2017   CHADS VASC=2  . Essential hypertension 09/26/2017  . Non-insulin treated type 2 diabetes mellitus (HCC) 09/26/2017  . Sleep apnea suspected 09/30/2017    Past Surgical History:  Procedure Laterality Date  . APPENDECTOMY  1971  . CARDIOVERSION N/A 09/28/2017   Procedure: CARDIOVERSION;  Surgeon: Thurmon Fair, MD;  [facility];  Service: Cardiovascular;  Laterality: N/A;  . TEE WITHOUT CARDIOVERSION N/A 09/28/2017   Procedure: TRANSESOPHAGEAL ECHOCARDIOGRAM (TEE);  Surgeon:  Thurmon Fair, MD;  Location: [facility] ENDOSCOPY;  Service: Cardiovascular;  Laterality: N/A;     Current Outpatient Medications  Medication Sig Dispense Refill  . acetaminophen (TYLENOL) 325 MG tablet Take 2 tablets (650 mg total) by mouth every 4 (four) hours as needed for headache or mild pain.    Marland Kitchen apixaban (ELIQUIS) 5 MG TABS tablet Take 1 tablet (5 mg total) by mouth 2 (two) times daily. 60 tablet 11  . diltiazem (CARDIZEM LA) 120 MG 24 hr tablet Take 1 tablet (120 mg total) by mouth daily. 30 tablet 11  . losartan (COZAAR) 25 MG tablet Take 1 tablet (25 mg total) by mouth daily. 30 tablet 11  . metFORMIN (GLUCOPHAGE) 500 MG tablet Take 1 tablet (500 mg total) by mouth daily with breakfast. 30 tablet 11  . sotalol (BETAPACE) 80 MG tablet Take 1 tablet (80 mg total) by mouth every 12 (twelve) hours. 60 tablet 11   No current facility-administered medications for this visit.     Allergies:   Patient has no known allergies.    Social History:  The patient  reports that he has been smoking cigarettes. He has smoked for the past 15.00 years. He has never used smokeless tobacco. He reports that he drinks about 3.0 standard drinks of alcohol per week. He reports that he does not use drugs.   Family History:  The patient's ***family history includes Hypertension in his mother.    ROS:  General:no colds or fevers, no weight  changes Skin:no rashes or ulcers HEENT:no blurred vision, no congestion CV:see HPI PUL:see HPI GI:no diarrhea constipation or melena, no indigestion GU:no hematuria, no dysuria MS:no joint pain, no claudication Neuro:no syncope, no lightheadedness Endo:no diabetes, no thyroid disease Wt Readings from Last 3 Encounters:  09/30/17 190 lb 14.4 oz (86.6 kg)     PHYSICAL EXAM: VS:  There were no vitals taken for this visit. , BMI There is no height or weight on file to calculate BMI. General:Pleasant affect, NAD Skin:Warm and dry, brisk capillary  refill HEENT:normocephalic, sclera clear, mucus membranes moist Neck:supple, no JVD, no bruits  Heart:S1S2 RRR without murmur, gallup, rub or click Lungs:clear without rales, rhonchi, or wheezes ZOX:WRUE, non tender, + BS, do not palpate liver spleen or masses Ext:no lower ext edema, 2+ pedal pulses, 2+ radial pulses Neuro:alert and oriented, MAE, follows commands, + facial symmetry    EKG:  EKG is ordered today. The ekg ordered today demonstrates ***   Recent Labs: 09/26/2017: B Natriuretic Peptide 105.4; BUN 12; Creatinine, Ser 1.14; Hemoglobin 19.6; Magnesium 1.7; Platelets 262; Potassium 3.9; Sodium 135; TSH 1.103    Lipid Panel No results found for: CHOL, TRIG, HDL, CHOLHDL, VLDL, LDLCALC, LDLDIRECT     Other studies Reviewed: Additional studies/ records that were reviewed today include: ***.   ASSESSMENT AND PLAN:  1.  ***   Current medicines are reviewed with the patient today.  The patient Has no concerns regarding medicines.  The following changes have been made:  See above Labs/ tests ordered today include:see above  Disposition:   FU:  see above  Signed, Nada Boozer, NP  11/23/2017 6:16 PM    [facility] [address] [place], [place], [place]  [address]/ [address] [place], [place] Phone: [phone]; Fax: [phone]  [phone]

## 2017-11-24 ENCOUNTER — Ambulatory Visit: Payer: Self-pay | Admitting: Family Medicine

## 2017-11-24 ENCOUNTER — Ambulatory Visit: Payer: Self-pay | Admitting: Cardiology

## 2017-12-05 MED FILL — metFORMIN HCL 500 MG TABS: 500 | 30 days supply | Qty: 30 | Fill #2

## 2017-12-05 MED FILL — LOSARTAN POTASSIUM 25 MG TA: 25 | 30 days supply | Qty: 30 | Fill #2

## 2017-12-05 MED FILL — SOTALOL HCL 80 MG TAB: 80 | 30 days supply | Qty: 60 | Fill #2

## 2017-12-06 MED FILL — CARTIA XT 120 MG CAPSULE SA: 120 | 30 days supply | Qty: 30 | Fill #0

## 2018-01-25 MED FILL — LOSARTAN POTASSIUM 25 MG TA: 25 | 30 days supply | Qty: 30 | Fill #3

## 2018-01-25 MED FILL — CARTIA XT 120 MG CP24: 120 | 30 days supply | Qty: 30 | Fill #1

## 2018-03-09 MED FILL — LOSARTAN POTASSIUM 25 MG TA: 25 | 30 days supply | Qty: 30 | Fill #4

## 2018-03-09 MED FILL — SOTALOL HCL 80 MG TAB: 80 | 30 days supply | Qty: 60 | Fill #3

## 2018-03-09 MED FILL — CARTIA XT 120 MG CP24: 120 | 30 days supply | Qty: 30 | Fill #2

## 2018-05-03 MED FILL — LOSARTAN POTASSIUM 25 MG TA: 25 | 30 days supply | Qty: 30 | Fill #5

## 2018-05-03 MED FILL — CARTIA XT 120 MG CP24: 120 | 30 days supply | Qty: 30 | Fill #3

## 2018-05-03 MED FILL — SOTALOL HCL 80 MG TAB: 80 | 30 days supply | Qty: 60 | Fill #4

## 2018-10-17 ENCOUNTER — Emergency Department (HOSPITAL_BASED_OUTPATIENT_CLINIC_OR_DEPARTMENT_OTHER): Payer: Self-pay

## 2018-10-17 ENCOUNTER — Inpatient Hospital Stay (HOSPITAL_BASED_OUTPATIENT_CLINIC_OR_DEPARTMENT_OTHER)
Admission: EM | Admit: 2018-10-17 | Discharge: 2018-10-20 | DRG: 292 | Disposition: A | Discharge status: Home / Self Care | Payer: Self-pay | Attending: Internal Medicine | Admitting: Internal Medicine

## 2018-10-17 ENCOUNTER — Observation Stay (HOSPITAL_BASED_OUTPATIENT_CLINIC_OR_DEPARTMENT_OTHER): Payer: Self-pay

## 2018-10-17 ENCOUNTER — Other Ambulatory Visit: Payer: Self-pay

## 2018-10-17 ENCOUNTER — Encounter (HOSPITAL_BASED_OUTPATIENT_CLINIC_OR_DEPARTMENT_OTHER): Payer: Self-pay | Admitting: Emergency Medicine

## 2018-10-17 DIAGNOSIS — E785 Hyperlipidemia, unspecified: Secondary | ICD-10-CM | POA: Diagnosis present

## 2018-10-17 DIAGNOSIS — Z6826 Body mass index (BMI) 26.0-26.9, adult: Secondary | ICD-10-CM

## 2018-10-17 DIAGNOSIS — D72829 Elevated white blood cell count, unspecified: Secondary | ICD-10-CM | POA: Diagnosis present

## 2018-10-17 DIAGNOSIS — G4730 Sleep apnea, unspecified: Secondary | ICD-10-CM | POA: Diagnosis present

## 2018-10-17 DIAGNOSIS — E876 Hypokalemia: Secondary | ICD-10-CM | POA: Diagnosis present

## 2018-10-17 DIAGNOSIS — Z8249 Family history of ischemic heart disease and other diseases of the circulatory system: Secondary | ICD-10-CM

## 2018-10-17 DIAGNOSIS — Z20828 Contact with and (suspected) exposure to other viral communicable diseases: Secondary | ICD-10-CM | POA: Diagnosis present

## 2018-10-17 DIAGNOSIS — E119 Type 2 diabetes mellitus without complications: Secondary | ICD-10-CM

## 2018-10-17 DIAGNOSIS — R740 Nonspecific elevation of levels of transaminase and lactic acid dehydrogenase [LDH]: Secondary | ICD-10-CM | POA: Diagnosis present

## 2018-10-17 DIAGNOSIS — R0602 Shortness of breath: Secondary | ICD-10-CM

## 2018-10-17 DIAGNOSIS — G4733 Obstructive sleep apnea (adult) (pediatric): Secondary | ICD-10-CM | POA: Diagnosis present

## 2018-10-17 DIAGNOSIS — Z91148 Patient's other noncompliance with medication regimen for other reason: Secondary | ICD-10-CM

## 2018-10-17 DIAGNOSIS — Z79899 Other long term (current) drug therapy: Secondary | ICD-10-CM

## 2018-10-17 DIAGNOSIS — I4821 Permanent atrial fibrillation: Secondary | ICD-10-CM | POA: Diagnosis present

## 2018-10-17 DIAGNOSIS — E663 Overweight: Secondary | ICD-10-CM | POA: Diagnosis present

## 2018-10-17 DIAGNOSIS — N179 Acute kidney failure, unspecified: Secondary | ICD-10-CM | POA: Diagnosis present

## 2018-10-17 DIAGNOSIS — Z7984 Long term (current) use of oral hypoglycemic drugs: Secondary | ICD-10-CM

## 2018-10-17 DIAGNOSIS — R7401 Elevation of levels of liver transaminase levels: Secondary | ICD-10-CM | POA: Diagnosis present

## 2018-10-17 DIAGNOSIS — Z7901 Long term (current) use of anticoagulants: Secondary | ICD-10-CM

## 2018-10-17 DIAGNOSIS — R17 Unspecified jaundice: Secondary | ICD-10-CM | POA: Diagnosis present

## 2018-10-17 DIAGNOSIS — I429 Cardiomyopathy, unspecified: Secondary | ICD-10-CM

## 2018-10-17 DIAGNOSIS — J019 Acute sinusitis, unspecified: Secondary | ICD-10-CM | POA: Diagnosis present

## 2018-10-17 DIAGNOSIS — I509 Heart failure, unspecified: Secondary | ICD-10-CM

## 2018-10-17 DIAGNOSIS — F17200 Nicotine dependence, unspecified, uncomplicated: Secondary | ICD-10-CM | POA: Diagnosis present

## 2018-10-17 DIAGNOSIS — Z87891 Personal history of nicotine dependence: Secondary | ICD-10-CM

## 2018-10-17 DIAGNOSIS — R739 Hyperglycemia, unspecified: Secondary | ICD-10-CM

## 2018-10-17 DIAGNOSIS — I5020 Unspecified systolic (congestive) heart failure: Secondary | ICD-10-CM

## 2018-10-17 DIAGNOSIS — I5023 Acute on chronic systolic (congestive) heart failure: Secondary | ICD-10-CM | POA: Diagnosis present

## 2018-10-17 DIAGNOSIS — I4819 Other persistent atrial fibrillation: Secondary | ICD-10-CM | POA: Diagnosis present

## 2018-10-17 DIAGNOSIS — Z9114 Patient's other noncompliance with medication regimen: Secondary | ICD-10-CM

## 2018-10-17 DIAGNOSIS — Z560 Unemployment, unspecified: Secondary | ICD-10-CM

## 2018-10-17 DIAGNOSIS — J329 Chronic sinusitis, unspecified: Secondary | ICD-10-CM | POA: Diagnosis present

## 2018-10-17 DIAGNOSIS — E1165 Type 2 diabetes mellitus with hyperglycemia: Secondary | ICD-10-CM | POA: Diagnosis present

## 2018-10-17 DIAGNOSIS — I340 Nonrheumatic mitral (valve) insufficiency: Secondary | ICD-10-CM

## 2018-10-17 DIAGNOSIS — I110 Hypertensive heart disease with heart failure: Principal | ICD-10-CM | POA: Diagnosis present

## 2018-10-17 DIAGNOSIS — I428 Other cardiomyopathies: Secondary | ICD-10-CM | POA: Diagnosis present

## 2018-10-17 DIAGNOSIS — I5022 Chronic systolic (congestive) heart failure: Secondary | ICD-10-CM

## 2018-10-17 DIAGNOSIS — I10 Essential (primary) hypertension: Secondary | ICD-10-CM | POA: Diagnosis present

## 2018-10-17 DIAGNOSIS — I4891 Unspecified atrial fibrillation: Secondary | ICD-10-CM

## 2018-10-17 DIAGNOSIS — I160 Hypertensive urgency: Secondary | ICD-10-CM | POA: Diagnosis present

## 2018-10-17 HISTORY — DX: Insufficient health insurance coverage: Z59.71

## 2018-10-17 HISTORY — DX: Problem related to housing and economic circumstances, unspecified: Z59.9

## 2018-10-17 HISTORY — DX: Type 2 diabetes mellitus without complications: E11.9

## 2018-10-17 HISTORY — DX: Other persistent atrial fibrillation: I48.19

## 2018-10-17 HISTORY — DX: Patient's noncompliance with other medical treatment and regimen: Z91.19

## 2018-10-17 HISTORY — DX: Other problems related to housing and economic circumstances: Z59.89

## 2018-10-17 HISTORY — DX: Patient's noncompliance with other medical treatment and regimen due to unspecified reason: Z91.199

## 2018-10-17 LAB — HEMOGLOBIN A1C
Hgb A1c MFr Bld: 9.9 % — ABNORMAL HIGH (ref 4.8–5.6)
Mean Plasma Glucose: 237.43 mg/dL

## 2018-10-17 LAB — CBC WITH DIFFERENTIAL/PLATELET
Abs Immature Granulocytes: 0.05 10*3/uL (ref 0.00–0.07)
Basophils Absolute: 0.1 10*3/uL (ref 0.0–0.1)
Basophils Relative: 1 %
Eosinophils Absolute: 0.2 10*3/uL (ref 0.0–0.5)
Eosinophils Relative: 2 %
HCT: 49.2 % (ref 39.0–52.0)
Hemoglobin: 16.6 g/dL (ref 13.0–17.0)
Immature Granulocytes: 1 %
Lymphocytes Relative: 16 %
Lymphs Abs: 1.7 10*3/uL (ref 0.7–4.0)
MCH: 32.7 pg (ref 26.0–34.0)
MCHC: 33.7 g/dL (ref 30.0–36.0)
MCV: 97 fL (ref 80.0–100.0)
Monocytes Absolute: 1.1 10*3/uL — ABNORMAL HIGH (ref 0.1–1.0)
Monocytes Relative: 10 %
Neutro Abs: 7.6 10*3/uL (ref 1.7–7.7)
Neutrophils Relative %: 70 %
Platelets: 218 10*3/uL (ref 150–400)
RBC: 5.07 MIL/uL (ref 4.22–5.81)
RDW: 12.5 % (ref 11.5–15.5)
WBC: 10.9 10*3/uL — ABNORMAL HIGH (ref 4.0–10.5)
nRBC: 0 % (ref 0.0–0.2)

## 2018-10-17 LAB — MAGNESIUM: Magnesium: 1.7 mg/dL (ref 1.7–2.4)

## 2018-10-17 LAB — COMPREHENSIVE METABOLIC PANEL
ALT: 59 U/L — ABNORMAL HIGH (ref 0–44)
AST: 45 U/L — ABNORMAL HIGH (ref 15–41)
Albumin: 3 g/dL — ABNORMAL LOW (ref 3.5–5.0)
Alkaline Phosphatase: 94 U/L (ref 38–126)
Anion gap: 11 (ref 5–15)
BUN: 12 mg/dL (ref 6–20)
CO2: 20 mmol/L — ABNORMAL LOW (ref 22–32)
Calcium: 8.3 mg/dL — ABNORMAL LOW (ref 8.9–10.3)
Chloride: 101 mmol/L (ref 98–111)
Creatinine, Ser: 0.99 mg/dL (ref 0.61–1.24)
GFR calc Af Amer: >60 mL/min (ref >60)
GFR calc non Af Amer: >60 mL/min (ref >60)
Glucose, Bld: 366 mg/dL — ABNORMAL HIGH (ref 70–99)
Potassium: 3.3 mmol/L — ABNORMAL LOW (ref 3.5–5.1)
Sodium: 132 mmol/L — ABNORMAL LOW (ref 135–145)
Total Bilirubin: 1.4 mg/dL — ABNORMAL HIGH (ref 0.3–1.2)
Total Protein: 6.6 g/dL (ref 6.5–8.1)

## 2018-10-17 LAB — SARS CORONAVIRUS 2 BY RT PCR (HOSPITAL ORDER, PERFORMED IN ~~LOC~~ HOSPITAL LAB): SARS Coronavirus 2: NEGATIVE

## 2018-10-17 LAB — GLUCOSE, CAPILLARY
Glucose-Capillary: 190 mg/dL — ABNORMAL HIGH (ref 70–99)
Glucose-Capillary: 304 mg/dL — ABNORMAL HIGH (ref 70–99)
Glucose-Capillary: 176 mg/dL — ABNORMAL HIGH (ref 70–99)

## 2018-10-17 LAB — TSH: TSH: 2.006 u[IU]/mL (ref 0.350–4.500)

## 2018-10-17 LAB — CBG MONITORING, ED: Glucose-Capillary: 171 mg/dL — ABNORMAL HIGH (ref 70–99)

## 2018-10-17 LAB — ECHOCARDIOGRAM COMPLETE
Height: 72 in
Weight: 3200 oz

## 2018-10-17 LAB — BRAIN NATRIURETIC PEPTIDE: B Natriuretic Peptide: 419.1 pg/mL — ABNORMAL HIGH (ref 0.0–100.0)

## 2018-10-17 LAB — MRSA PCR SCREENING: MRSA by PCR: NEGATIVE

## 2018-10-17 IMAGING — DX PORTABLE CHEST - 1 VIEW
1 series · 1 of 1 positions shown · non-contrast
Comparison: Portable chest [DATE] and earlier.

CLINICAL DATA: 58-year-old male with shortness of breath for 2
weeks.

EXAM:
PORTABLE CHEST 1 VIEW

[chest ap]
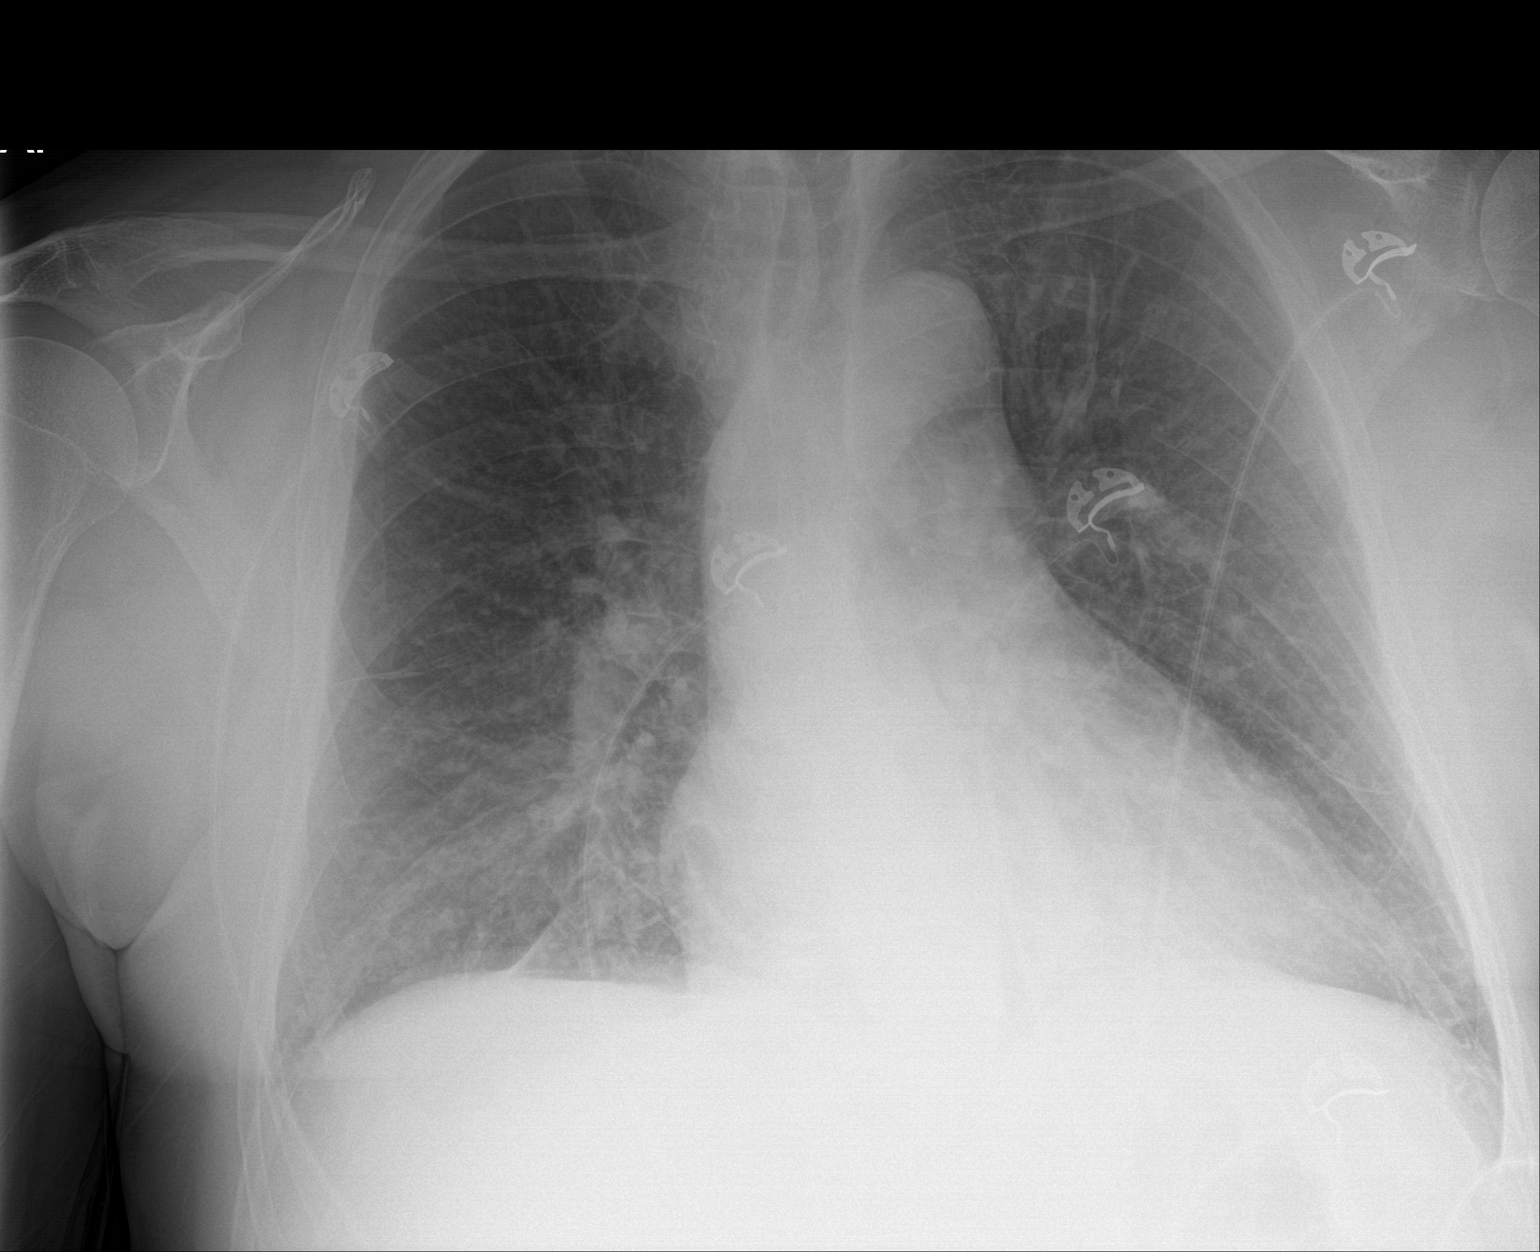

[1 of 1 positions shown; findings below may reference images not displayed]

FINDINGS: Portable AP upright view at [IL] hours. Stable cardiomegaly and
mediastinal contours. Stable lung volumes. Questionable increased
pulmonary vascularity, but no overt edema. Otherwise allowing for
portable technique the lungs are clear. Visualized tracheal air
column is within normal limits. No pneumothorax.

No acute osseous abnormality identified.
IMPRESSION: Stable cardiomegaly. Increased pulmonary vascularity but no overt
edema.

## 2018-10-17 MED ORDER — CARVEDILOL 6.25 MG PO TABS
6.2500 mg | ORAL_TABLET | Freq: Two times a day (BID) | ORAL | Status: DC
  Administered 2018-10-17 – 2018-10-18 (×2): 6.25 mg via ORAL
  Filled 2018-10-17 (×2): qty 1

## 2018-10-17 MED ORDER — ONDANSETRON HCL 4 MG PO TABS: 4.0000 mg | ORAL_TABLET | Freq: Four times a day (QID) | ORAL | Status: DC | PRN

## 2018-10-17 MED ORDER — MAGNESIUM SULFATE IN D5W 1-5 GM/100ML-% IV SOLN
1.0000 g | Freq: Once | INTRAVENOUS | Status: AC
  Administered 2018-10-17: 1 g via INTRAVENOUS
  Filled 2018-10-17: qty 100

## 2018-10-17 MED ORDER — ALBUTEROL SULFATE (2.5 MG/3ML) 0.083% IN NEBU: 2.5000 mg | INHALATION_SOLUTION | Freq: Four times a day (QID) | RESPIRATORY_TRACT | Status: DC | PRN

## 2018-10-17 MED ORDER — FLUTICASONE PROPIONATE 50 MCG/ACT NA SUSP
1.0000 | Freq: Every day | NASAL | Status: DC
  Administered 2018-10-17 – 2018-10-20 (×4): 1 via NASAL
  Filled 2018-10-17: qty 16

## 2018-10-17 MED ORDER — POTASSIUM CHLORIDE CRYS ER 20 MEQ PO TBCR
20.0000 meq | EXTENDED_RELEASE_TABLET | Freq: Two times a day (BID) | ORAL | Status: DC
  Administered 2018-10-17 – 2018-10-20 (×6): 20 meq via ORAL
  Filled 2018-10-17 (×6): qty 1

## 2018-10-17 MED ORDER — APIXABAN 5 MG PO TABS
5.0000 mg | ORAL_TABLET | Freq: Two times a day (BID) | ORAL | Status: DC
  Administered 2018-10-17 – 2018-10-20 (×6): 5 mg via ORAL
  Filled 2018-10-17 (×7): qty 1

## 2018-10-17 MED ORDER — GUAIFENESIN ER 600 MG PO TB12
600.0000 mg | ORAL_TABLET | Freq: Two times a day (BID) | ORAL | Status: DC
  Administered 2018-10-17 – 2018-10-20 (×6): 600 mg via ORAL
  Filled 2018-10-17 (×6): qty 1

## 2018-10-17 MED ORDER — LOSARTAN POTASSIUM 25 MG PO TABS
25.0000 mg | ORAL_TABLET | Freq: Every day | ORAL | Status: DC
  Administered 2018-10-18: 25 mg via ORAL
  Filled 2018-10-17 (×2): qty 1

## 2018-10-17 MED ORDER — INSULIN ASPART 100 UNIT/ML ~~LOC~~ SOLN
10.0000 [IU] | Freq: Once | SUBCUTANEOUS | Status: AC
  Administered 2018-10-17: 10 [IU] via SUBCUTANEOUS
  Filled 2018-10-17: qty 1

## 2018-10-17 MED ORDER — SODIUM CHLORIDE 0.9% FLUSH
3.0000 mL | Freq: Two times a day (BID) | INTRAVENOUS | Status: DC
  Administered 2018-10-17 – 2018-10-19 (×5): 3 mL via INTRAVENOUS

## 2018-10-17 MED ORDER — POTASSIUM CHLORIDE CRYS ER 20 MEQ PO TBCR: 40.0000 meq | EXTENDED_RELEASE_TABLET | ORAL | Status: AC

## 2018-10-17 MED ORDER — LIVING WELL WITH DIABETES BOOK
Freq: Once | Status: AC
  Administered 2018-10-17: 16:00:00
  Filled 2018-10-17: qty 1

## 2018-10-17 MED ORDER — ACETAMINOPHEN 325 MG PO TABS: 650.0000 mg | ORAL_TABLET | Freq: Four times a day (QID) | ORAL | Status: DC | PRN

## 2018-10-17 MED ORDER — INSULIN ASPART 100 UNIT/ML ~~LOC~~ SOLN
0.0000 [IU] | Freq: Every day | SUBCUTANEOUS | Status: DC
  Administered 2018-10-18: 22:00:00 2 [IU] via SUBCUTANEOUS
  Administered 2018-10-19: 3 [IU] via SUBCUTANEOUS

## 2018-10-17 MED ORDER — ACETAMINOPHEN 650 MG RE SUPP: 650.0000 mg | Freq: Four times a day (QID) | RECTAL | Status: DC | PRN

## 2018-10-17 MED ORDER — APIXABAN 5 MG PO TABS: 5.0000 mg | ORAL_TABLET | Freq: Two times a day (BID) | ORAL | Status: DC

## 2018-10-17 MED ORDER — AMOXICILLIN-POT CLAVULANATE 875-125 MG PO TABS
1.0000 | ORAL_TABLET | Freq: Two times a day (BID) | ORAL | Status: DC
  Administered 2018-10-17 – 2018-10-18 (×3): 1 via ORAL
  Filled 2018-10-17 (×5): qty 1

## 2018-10-17 MED ORDER — INSULIN ASPART 100 UNIT/ML ~~LOC~~ SOLN
0.0000 [IU] | Freq: Three times a day (TID) | SUBCUTANEOUS | Status: DC
  Administered 2018-10-17 – 2018-10-18 (×2): 11 [IU] via SUBCUTANEOUS
  Administered 2018-10-18: 12:00:00 8 [IU] via SUBCUTANEOUS
  Administered 2018-10-18 – 2018-10-19 (×3): 5 [IU] via SUBCUTANEOUS
  Administered 2018-10-20: 3 [IU] via SUBCUTANEOUS

## 2018-10-17 MED ORDER — INSULIN DETEMIR 100 UNIT/ML ~~LOC~~ SOLN
7.0000 [IU] | Freq: Every day | SUBCUTANEOUS | Status: DC
  Administered 2018-10-17 – 2018-10-18 (×2): 7 [IU] via SUBCUTANEOUS
  Filled 2018-10-17 (×3): qty 0.07

## 2018-10-17 MED ORDER — ONDANSETRON HCL 4 MG/2ML IJ SOLN: 4.0000 mg | Freq: Four times a day (QID) | INTRAMUSCULAR | Status: DC | PRN

## 2018-10-17 MED ORDER — FUROSEMIDE 10 MG/ML IJ SOLN
20.0000 mg | Freq: Two times a day (BID) | INTRAMUSCULAR | Status: DC
  Administered 2018-10-17 – 2018-10-18 (×2): 20 mg via INTRAVENOUS
  Filled 2018-10-17 (×2): qty 2

## 2018-10-17 MED ORDER — DILTIAZEM HCL 25 MG/5ML IV SOLN
20.0000 mg | Freq: Once | INTRAVENOUS | Status: AC
  Administered 2018-10-17: 20 mg via INTRAVENOUS
  Filled 2018-10-17: qty 5

## 2018-10-17 MED ORDER — DILTIAZEM HCL 100 MG IV SOLR
5.0000 mg/h | INTRAVENOUS | Status: DC
  Administered 2018-10-17 (×2): 5 mg/h via INTRAVENOUS
  Filled 2018-10-17: qty 100

## 2018-10-17 MED ORDER — LORATADINE 10 MG PO TABS
10.0000 mg | ORAL_TABLET | Freq: Every day | ORAL | Status: DC
  Administered 2018-10-17 – 2018-10-20 (×4): 10 mg via ORAL
  Filled 2018-10-17 (×4): qty 1

## 2018-10-17 MED ORDER — DILTIAZEM HCL 100 MG IV SOLR
INTRAVENOUS | Status: AC
  Filled 2018-10-17: qty 100

## 2018-10-17 MED ORDER — APIXABAN 2.5 MG PO TABS
5.0000 mg | ORAL_TABLET | Freq: Once | ORAL | Status: AC
  Administered 2018-10-17: 5 mg via ORAL
  Filled 2018-10-17: qty 2

## 2018-10-17 NOTE — Plan of Care (Signed)
  Problem: Education: Goal: Knowledge of General Education information will improve Description: Including pain rating scale, medication(s)/side effects and non-pharmacologic comfort measures Outcome: Progressing   Problem: Health Behavior/Discharge Planning: Goal: Ability to manage health-related needs will improve Outcome: Progressing   Problem: Nutrition: Goal: Adequate nutrition will be maintained Outcome: Progressing   Problem: Coping: Goal: Level of anxiety will decrease Outcome: Progressing   Problem: Elimination: Goal: Will not experience complications related to bowel motility Outcome: Progressing Goal: Will not experience complications related to urinary retention Outcome: Progressing   

## 2018-10-17 NOTE — TOC Initial Note (Addendum)
Transition of Care [facility]) - Initial/Assessment Note    Patient Details  Name: David [name] MRN: [id number] Date of Birth: 1961/02/25  Transition of Care [facility]) CM/SW Contact:    Zenon Mayo, RN Phone Number: 10/17/2018, 11:46 AM  Clinical Narrative:                 From home with room mate, he states he lost his insurance, will need assistance with medications.  NCM scheduled a follow up apt at the [facility] clinic for 7/31 at 9:30.  He states he was on eliquis before and used the 30 day free coupon.  NCM contacted [facility] to have them assist in filling his meds prior to discharge.  They will see if the co pay card will go thru for him for the eliquis, if it does not they will run it under the Match .  Patient states he will have transportation at dc. Also looks like they will try Entresto initiation.    7/17 Tomi Bamberger RN, BSN- NCM spoke with [facility] , they fille eliquis free for patient , NCM went to pick up for patient  And gave to [facility] RN .  Also TOC filled the rest of his medications for him. He has a follow up apt with [facility].    Expected Discharge Plan: Home/Self Care Barriers to Discharge: No Barriers Identified   Patient Goals and CMS Choice Patient states their goals for this hospitalization and ongoing recovery are:: to get back on his medications   Choice offered to / list presented to : NA  Expected Discharge Plan and Services Expected Discharge Plan: Home/Self Care In-house Referral: ([place]) Discharge Planning Services: CM Consult, [place] [facility], Medication Assistance, [place] Acute Care Choice: NA Living arrangements for the past 2 months: Single Family Home                 DME Arranged: (NA)         HH Arranged: NA          Prior Living Arrangements/Services Living arrangements for the past 2 months: Single Family Home Lives with:: Roommate Patient language and need for interpreter reviewed:: Yes Do you feel  safe going back to the place where you live?: Yes      Need for Family Participation in Patient Care: No (Comment) Care giver support system in place?: No (comment)   Criminal Activity/Legal Involvement Pertinent to Current Situation/Hospitalization: No - Comment as needed  Activities of Daily Living      Permission Sought/Granted                  Emotional Assessment Appearance:: Appears stated age Attitude/Demeanor/Rapport: Engaged Affect (typically observed): Appropriate Orientation: : Oriented to Self, Oriented to Place, Oriented to  Time, Oriented to Situation Alcohol / Substance Use: Not Applicable Psych Involvement: No (comment)  Admission diagnosis:  SOB (shortness of breath) [R06.02] Hyperglycemia [R73.9] Noncompliance with medication regimen [Z91.14] Atrial fibrillation with rapid ventricular response (HCC) [I48.91] Hypertension not at goal [I10] Patient Active Problem List   Diagnosis Date Noted  . Chronic anticoagulation 09/30/2017  . Smoker 09/30/2017  . Cardiomyopathy ([place]) 09/30/2017  . Sleep apnea suspected 09/30/2017  . Essential hypertension 09/26/2017  . Non-insulin treated type 2 diabetes mellitus ([place]) 09/26/2017  . Atrial fibrillation with RVR ([place]) 09/26/2017   PCP:  Patient, No Pcp Per Pharmacy:   [id number] - [place], [address] AT [facility] OF  [facility] [address] [facility] [address] [place] [address] Phone: [phone] Fax: [phone]     Social Determinants of Health (SDOH) Interventions    Readmission Risk Interventions No flowsheet data found.

## 2018-10-17 NOTE — ED Provider Notes (Signed)
[facility] DEPT [facility] Provider Note: David Spurling, MD, FACEP  CSN: [id number] MRN: [id number] ARRIVAL: 10/17/18 at [place]: [place]  Shortness of Breath   HISTORY OF PRESENT ILLNESS  10/17/18 4:19 AM David [name] is a 58 y.o. male with history of cardiomyopathy and atrial fibrillation noncompliant with his medications.  He is here with a 2-week history of shortness of breath, worse with exertion.  The symptoms had been intermittent, worse at night, but became severe enough that he could not get around overnight so he came to the ED.  He has not had chest pain with it.  He has had a cough for about 3 weeks.  He was noted to be in atrial fibrillation with rapid ventricular response on arrival.  He was given a Cardizem bolus and placed on a Cardizem drip with rate control and improvement in his dyspnea.    Past Medical History:  Diagnosis Date  . Atrial fibrillation ([place]) 09/26/2017  . Cardiomyopathy ([place]) 09/30/2017   Myoview low risk-EF 45%  . Chronic anticoagulation 09/30/2017   CHADS VASC=2  . Essential hypertension 09/26/2017  . Non-insulin treated type 2 diabetes mellitus ([place]) 09/26/2017  . Sleep apnea suspected 09/30/2017    Past Surgical History:  Procedure Laterality Date  . APPENDECTOMY  1971  . CARDIOVERSION N/A 09/28/2017   Procedure: CARDIOVERSION;  Surgeon: Sanda Klein, MD;  [facility];  Service: Cardiovascular;  Laterality: N/A;  . TEE WITHOUT CARDIOVERSION N/A 09/28/2017   Procedure: TRANSESOPHAGEAL ECHOCARDIOGRAM (TEE);  Surgeon: Sanda Klein, MD;  Location: [facility] ENDOSCOPY;  Service: Cardiovascular;  Laterality: N/A;    Family History  Problem Relation Age of Onset  . Hypertension Mother     Social History   Tobacco Use  . Smoking status: Former Smoker    Years: 15.00    Types: Cigarettes    Quit date: 03/18/2018    Years since quitting: 0.5  . Smokeless tobacco: Never Used  . Tobacco comment: 09/26/2017 "2-3  cigarettes/month now"  Substance Use Topics  . Alcohol use: Yes    Alcohol/week: 3.0 standard drinks    Types: 3 Cans of beer per week  . Drug use: Never    Prior to Admission medications   Medication Sig Start Date End Date Taking? Authorizing Provider  acetaminophen (TYLENOL) 325 MG tablet Take 2 tablets (650 mg total) by mouth every 4 (four) hours as needed for headache or mild pain. 09/30/17   Erlene Quan, PA-C  apixaban (ELIQUIS) 5 MG TABS tablet Take 1 tablet (5 mg total) by mouth 2 (two) times daily. 09/30/17   Erlene Quan, PA-C  diltiazem (CARDIZEM LA) 120 MG 24 hr tablet Take 1 tablet (120 mg total) by mouth daily. 09/30/17   Erlene Quan, PA-C  losartan (COZAAR) 25 MG tablet Take 1 tablet (25 mg total) by mouth daily. 10/01/17   Erlene Quan, PA-C  metFORMIN (GLUCOPHAGE) 500 MG tablet Take 1 tablet (500 mg total) by mouth daily with breakfast. 10/01/17   Erlene Quan, PA-C  sotalol (BETAPACE) 80 MG tablet Take 1 tablet (80 mg total) by mouth every 12 (twelve) hours. 09/30/17   Erlene Quan, PA-C    Allergies Patient has no known allergies.   REVIEW OF SYSTEMS  Negative except as noted here or in the History of Present Illness.   PHYSICAL EXAMINATION  Initial Vital Signs Blood pressure (!) 148/98, pulse 94, temperature 99.1 F (37.3 C), temperature source Oral, resp. rate Marland Kitchen)  30, height 6' (1.829 m), weight 90.7 kg, SpO2 95 %.  Examination General: Well-developed, well-nourished male in no acute distress; appearance consistent with age of record HENT: normocephalic; atraumatic Eyes: pupils equal, round and reactive to light; extraocular muscles intact Neck: supple Heart: Irregular rhythm; no murmur heard Lungs: clear to auscultation bilaterally Abdomen: soft; nondistended; nontender; bowel sounds present Extremities: No deformity; full range of motion; pulses normal Neurologic: Awake, alert and oriented; motor function intact in all extremities and symmetric;  no facial droop Skin: Warm and dry Psychiatric: Normal mood and affect   RESULTS  Summary of this visit's results, reviewed by myself:   EKG Interpretation  Date/Time:  Wednesday October 17 2018 01:10:37 EDT Ventricular Rate:  128 PR Interval:    QRS Duration: 99 QT Interval:  331 QTC Calculation: 483 R Axis:   100 Text Interpretation:  Atrial fibrillation with RVR Right axis deviation Anteroseptal infarct, old Borderline repolarization abnormality Baseline wander in lead(s) I III aVL aVF Rate is faster Confirmed by Kolyn Rozario, Jonny Ruiz ([id number]) on 10/17/2018 1:20:44 AM      Laboratory Studies: Results for orders placed or performed during the hospital encounter of 10/17/18 (from the past 24 hour(s))  CBC with Differential     Status: Abnormal   Collection Time: 10/17/18  1:09 AM  Result Value Ref Range   WBC 10.9 (H) 4.0 - 10.5 K/uL   RBC 5.07 4.22 - 5.81 MIL/uL   Hemoglobin 16.6 13.0 - 17.0 g/dL   HCT 77.4 12.8 - 78.6 %   MCV 97.0 80.0 - 100.0 fL   MCH 32.7 26.0 - 34.0 pg   MCHC 33.7 30.0 - 36.0 g/dL   RDW 76.7 20.9 - 47.0 %   Platelets 218 150 - 400 K/uL   nRBC 0.0 0.0 - 0.2 %   Neutrophils Relative % 70 %   Neutro Abs 7.6 1.7 - 7.7 K/uL   Lymphocytes Relative 16 %   Lymphs Abs 1.7 0.7 - 4.0 K/uL   Monocytes Relative 10 %   Monocytes Absolute 1.1 (H) 0.1 - 1.0 K/uL   Eosinophils Relative 2 %   Eosinophils Absolute 0.2 0.0 - 0.5 K/uL   Basophils Relative 1 %   Basophils Absolute 0.1 0.0 - 0.1 K/uL   Immature Granulocytes 1 %   Abs Immature Granulocytes 0.05 0.00 - 0.07 K/uL  Comprehensive metabolic panel     Status: Abnormal   Collection Time: 10/17/18  1:09 AM  Result Value Ref Range   Sodium 132 (L) 135 - 145 mmol/L   Potassium 3.3 (L) 3.5 - 5.1 mmol/L   Chloride 101 98 - 111 mmol/L   CO2 20 (L) 22 - 32 mmol/L   Glucose, Bld 366 (H) 70 - 99 mg/dL   BUN 12 6 - 20 mg/dL   Creatinine, Ser 9.62 0.61 - 1.24 mg/dL   Calcium 8.3 (L) 8.9 - 10.3 mg/dL   Total Protein 6.6 6.5  - 8.1 g/dL   Albumin 3.0 (L) 3.5 - 5.0 g/dL   AST 45 (H) 15 - 41 U/L   ALT 59 (H) 0 - 44 U/L   Alkaline Phosphatase 94 38 - 126 U/L   Total Bilirubin 1.4 (H) 0.3 - 1.2 mg/dL   GFR calc non Af Amer >60 >60 mL/min   GFR calc Af Amer >60 >60 mL/min   Anion gap 11 5 - 15  Brain natriuretic peptide     Status: Abnormal   Collection Time: 10/17/18  1:09 AM  Result Value  Ref Range   B Natriuretic Peptide 419.1 (H) 0.0 - 100.0 pg/mL  SARS Coronavirus 2 (CEPHEID- Performed in [facility] hospital lab), Hosp Order     Status: None   Collection Time: 10/17/18  2:02 AM   Specimen: Nasopharyngeal Swab  Result Value Ref Range   SARS Coronavirus 2 NEGATIVE NEGATIVE   Imaging Studies: Dg Chest Port 1 View  Result Date: 10/17/2018 CLINICAL DATA:  58 year old male with shortness of breath for 2 weeks. EXAM: PORTABLE CHEST 1 VIEW COMPARISON:  Portable chest 09/26/2017 and earlier. FINDINGS: Portable AP upright view at 0104 hours. Stable cardiomegaly and mediastinal contours. Stable lung volumes. Questionable increased pulmonary vascularity, but no overt edema. Otherwise allowing for portable technique the lungs are clear. Visualized tracheal air column is within normal limits. No pneumothorax. No acute osseous abnormality identified. IMPRESSION: Stable cardiomegaly. Increased pulmonary vascularity but no overt edema. Electronically Signed   By: Odessa FlemingH  Hall M.D.   On: 10/17/2018 01:38    ED COURSE and MDM  Nursing notes and initial vitals signs, including pulse oximetry, reviewed.  Vitals:   10/17/18 0130 10/17/18 0200 10/17/18 0300 10/17/18 0402  BP: (!) 153/116 (!) 139/127 (!) 148/98   Pulse: (!) 48 (!) 45 96 94  Resp: (!) 36 (!) 28 20 (!) 30  Temp:   99.1 F (37.3 C)   TempSrc:   Oral   SpO2: 95% 95% 96% 95%  Weight:      Height:       5:03 AM Discussed with Dr. Toniann FailKakrakandy who accepts [facility].  Will start Eliquis in the ED and give subcu insulin for his hyperglycemia.   PROCEDURES    ED DIAGNOSES     ICD-10-CM   1. Atrial fibrillation with rapid ventricular response (HCC)  I48.91   2. SOB (shortness of breath)  R06.02 DG [facility]    DG Chest [place]  3. Noncompliance with medication regimen  Z91.14   4. Hyperglycemia  R73.9   5. Hypertension not at goal  [facility]10       Toyia Jelinek, Jonny RuizJohn, MD 10/17/18 [phone]

## 2018-10-17 NOTE — Progress Notes (Signed)
  Echocardiogram 2D Echocardiogram has been performed.  David [name] 10/17/2018, 3:29 PM

## 2018-10-17 NOTE — H&P (Addendum)
History and Physical    David [name] ZOX:[id number] DOB: 07/12/1960 DOA: 10/17/2018  Referring MD/NP/PA: Midge MiniumArshad Kakrakandy, MD PCP: Patient, No Pcp Per  [facility]  Chief Complaint: Shortness of breath  I have personally briefly reviewed patient's old medical records in [place] [facility]   HPI: David KeyMark Orea is a 58 y.o. male with medical history significant of cardiomyopathy, atrial fibrillation, and diabetes mellitus type 2; who presented with complaints shortness of breath intermittently over the last 2 weeks.  Over the last two days shortness of breath has been more persisted.  Notes symptoms been worse at night and he has been unable to lay flat.  Complains of a persistent dry cough, lower extremity swelling, nasal congestion, eye irritation, sinus pressure, and headache.  Patient admits to previously having history of atrial fibrillation for which he underwent cardioversion last year on 09/28/2017 by Dr. Royann Shiversroitoru.  However, patient reports that he had lost his job approximately 6 months due to COVID-19 and had been without insurance.  Therefore he has been off all of his medications as he had been using his money to pay for rent.  ED Course: Upon admission into the emergency department patient was noted to be afebrile, pulse up to 128 and atrial fibrillation with RVR, blood pressure up to 192/136, and all other vital signs maintained.Chest x-ray revealed cardiomegaly without signs of overt pulmonary edema.  COVID-19 screening negative.  Patient was given 10 units of NovoLog, 5 mg of Eliquis, 20 mg of diltiazem IV, and started on diltiazem drip.  Accepted as observation to a progressive bed.  Review of Systems  Constitutional: Negative for fever and malaise/fatigue.  HENT: Positive for congestion and sinus pain.   Eyes: Positive for discharge and redness. Negative for photophobia.  Respiratory: Positive for cough and shortness of breath.   Cardiovascular: Positive for leg  swelling. Negative for chest pain.  Gastrointestinal: Negative for abdominal pain, nausea and vomiting.  Genitourinary: Negative for dysuria and frequency.  Musculoskeletal: Negative for falls.  Skin: Negative for rash.  Neurological: Positive for headaches. Negative for focal weakness and loss of consciousness.  Psychiatric/Behavioral: Negative for memory loss and substance abuse.    Past Medical History:  Diagnosis Date  . Atrial fibrillation (HCC) 09/26/2017  . Cardiomyopathy (HCC) 09/30/2017   Myoview low risk-EF 45%  . Chronic anticoagulation 09/30/2017   CHADS VASC=2  . Essential hypertension 09/26/2017  . Non-insulin treated type 2 diabetes mellitus (HCC) 09/26/2017  . Sleep apnea suspected 09/30/2017    Past Surgical History:  Procedure Laterality Date  . APPENDECTOMY  1971  . CARDIOVERSION N/A 09/28/2017   Procedure: CARDIOVERSION;  Surgeon: Thurmon Fairroitoru, Mihai, MD;  Location: [facility] ENDOSCOPY;  Service: Cardiovascular;  Laterality: N/A;  . TEE WITHOUT CARDIOVERSION N/A 09/28/2017   Procedure: TRANSESOPHAGEAL ECHOCARDIOGRAM (TEE);  Surgeon: Thurmon Fairroitoru, Mihai, MD;  [facility];  Service: Cardiovascular;  Laterality: N/A;     reports that he quit smoking about 7 months ago. His smoking use included cigarettes. He quit after 15.00 years of use. He has never used smokeless tobacco. He reports current alcohol use of about 3.0 standard drinks of alcohol per week. He reports that he does not use drugs.  No Known Allergies  Family History  Problem Relation Age of Onset  . Hypertension Mother     Prior to Admission medications   Medication Sig Start Date End Date Taking? Authorizing Provider  acetaminophen (TYLENOL) 325 MG tablet Take 2 tablets (650 mg total) by mouth every  4 (four) hours as needed for headache or mild pain. 09/30/17   Abelino Derrick, PA-C  apixaban (ELIQUIS) 5 MG TABS tablet Take 1 tablet (5 mg total) by mouth 2 (two) times daily. 09/30/17   Abelino Derrick, PA-C   diltiazem (CARDIZEM LA) 120 MG 24 hr tablet Take 1 tablet (120 mg total) by mouth daily. 09/30/17   Abelino Derrick, PA-C  losartan (COZAAR) 25 MG tablet Take 1 tablet (25 mg total) by mouth daily. 10/01/17   Abelino Derrick, PA-C  metFORMIN (GLUCOPHAGE) 500 MG tablet Take 1 tablet (500 mg total) by mouth daily with breakfast. 10/01/17   Abelino Derrick, PA-C  sotalol (BETAPACE) 80 MG tablet Take 1 tablet (80 mg total) by mouth every 12 (twelve) hours. 09/30/17   Abelino Derrick, PA-C    Physical Exam:  Constitutional: Middle-age male who appears to be in no acute distress Vitals:   10/17/18 0900 10/17/18 0930 10/17/18 0941 10/17/18 1111  BP: (!) 129/97 (!) 122/107  (!) 129/103  Pulse: 85 85    Resp: (!) 32 17    Temp:    98.1 F (36.7 C)  TempSrc:    Oral  SpO2: 95% 95% 98%   Weight:      Height:       Eyes: PERRL, lids and conjunctivae normal ENMT: Mucous membranes are moist.  Maxillary and frontal sinus tenderness to palpation.  Neck: normal, supple, no masses, no thyromegaly Respiratory: clear to auscultation bilaterally, no wheezing, no crackles. Normal respiratory effort. No accessory muscle use.  Cardiovascular: Irregularly irregular, no murmurs / rubs / gallops.  Trace lower extremity edema. 2+ pedal pulses. No carotid bruits.  Abdomen: no tenderness, no masses palpated. No hepatosplenomegaly. Bowel sounds positive.  Musculoskeletal: no clubbing / cyanosis. No joint deformity upper and lower extremities. Good ROM, no contractures. Normal muscle tone.  Skin: no rashes, lesions, ulcers. No induration Neurologic: CN 2-12 grossly intact. Sensation intact, DTR normal. Strength 5/5 in all 4.  Psychiatric: Normal judgment and insight. Alert and oriented x 3. Normal mood.     Labs on Admission: I have personally reviewed following labs and imaging studies  CBC: Recent Labs  Lab 10/17/18 0109  WBC 10.9*  NEUTROABS 7.6  HGB 16.6  HCT 49.2  MCV 97.0  PLT 218   Basic Metabolic  Panel: Recent Labs  Lab 10/17/18 0109  NA 132*  K 3.3*  CL 101  CO2 20*  GLUCOSE 366*  BUN 12  CREATININE 0.99  CALCIUM 8.3*   GFR: Estimated Creatinine Clearance: 90.4 mL/min (by C-G formula based on SCr of 0.99 mg/dL). Liver Function Tests: Recent Labs  Lab 10/17/18 0109  AST 45*  ALT 59*  ALKPHOS 94  BILITOT 1.4*  PROT 6.6  ALBUMIN 3.0*   No results for input(s): LIPASE, AMYLASE in the last 168 hours. No results for input(s): AMMONIA in the last 168 hours. Coagulation Profile: No results for input(s): INR, PROTIME in the last 168 hours. Cardiac Enzymes: No results for input(s): CKTOTAL, CKMB, CKMBINDEX, TROPONINI in the last 168 hours. BNP (last 3 results) No results for input(s): PROBNP in the last 8760 hours. HbA1C: No results for input(s): HGBA1C in the last 72 hours. CBG: Recent Labs  Lab 10/17/18 0931 10/17/18 1132  GLUCAP 171* 190*   Lipid Profile: No results for input(s): CHOL, HDL, LDLCALC, TRIG, CHOLHDL, LDLDIRECT in the last 72 hours. Thyroid Function Tests: No results for input(s): TSH, T4TOTAL, FREET4, T3FREE, THYROIDAB in the  last 72 hours. Anemia Panel: No results for input(s): VITAMINB12, FOLATE, FERRITIN, TIBC, IRON, RETICCTPCT in the last 72 hours. Urine analysis: No results found for: COLORURINE, APPEARANCEUR, LABSPEC, PHURINE, GLUCOSEU, HGBUR, BILIRUBINUR, KETONESUR, PROTEINUR, UROBILINOGEN, NITRITE, LEUKOCYTESUR Sepsis Labs: Recent Results (from the past 240 hour(s))  SARS Coronavirus 2 (CEPHEID- Performed in [facility] lab), Hosp Order     Status: None   Collection Time: 10/17/18  2:02 AM   Specimen: Nasopharyngeal Swab  Result Value Ref Range Status   SARS Coronavirus 2 NEGATIVE NEGATIVE Final    Comment: (NOTE) If result is NEGATIVE SARS-CoV-2 target nucleic acids are NOT DETECTED. The SARS-CoV-2 RNA is generally detectable in upper and lower  respiratory specimens during the acute phase of infection. The lowest   concentration of SARS-CoV-2 viral copies this assay can detect is 250  copies / mL. A negative result does not preclude SARS-CoV-2 infection  and should not be used as the sole basis for treatment or other  patient management decisions.  A negative result may occur with  improper specimen collection / handling, submission of specimen other  than nasopharyngeal swab, presence of viral mutation(s) within the  areas targeted by this assay, and inadequate number of viral copies  (<250 copies / mL). A negative result must be combined with clinical  observations, patient history, and epidemiological information. If result is POSITIVE SARS-CoV-2 target nucleic acids are DETECTED. The SARS-CoV-2 RNA is generally detectable in upper and lower  respiratory specimens dur ing the acute phase of infection.  Positive  results are indicative of active infection with SARS-CoV-2.  Clinical  correlation with patient history and other diagnostic information is  necessary to determine patient infection status.  Positive results do  not rule out bacterial infection or co-infection with other viruses. If result is PRESUMPTIVE POSTIVE SARS-CoV-2 nucleic acids MAY BE PRESENT.   A presumptive positive result was obtained on the submitted specimen  and confirmed on repeat testing.  While 2019 novel coronavirus  (SARS-CoV-2) nucleic acids may be present in the submitted sample  additional confirmatory testing may be necessary for epidemiological  and / or clinical management purposes  to differentiate between  SARS-CoV-2 and other Sarbecovirus currently known to infect humans.  If clinically indicated additional testing with an alternate test  methodology [phone] is advised. The SARS-CoV-2 RNA is generally  detectable in upper and lower respiratory sp ecimens during the acute  phase of infection. The expected result is Negative. Fact Sheet for Patients:  BoilerBrush.com.cyhttps://www.fda.gov/media/136312/download Fact Sheet  for Healthcare Providers: https://pope.com/https://www.fda.gov/media/136313/download This test is not yet approved or cleared by the [place] FDA and has been authorized for detection and/or diagnosis of SARS-CoV-2 by FDA under an Emergency Use Authorization (EUA).  This EUA will remain in effect (meaning this test can be used) for the duration of the COVID-19 declaration under Section 564(b)(1) of the Act, 21 U.S.C. section 360bbb-3(b)(1), unless the authorization is terminated or revoked sooner. Performed at [facility] [place], [address] [place], [place] [address]      Radiological Exams on Admission: Dg Chest [place] View  Result Date: 10/17/2018 CLINICAL DATA:  58 year old male with shortness of breath for 2 weeks. EXAM: PORTABLE CHEST 1 VIEW COMPARISON:  Portable chest 09/26/2017 and earlier. FINDINGS: Portable AP upright view at 0104 hours. Stable cardiomegaly and mediastinal contours. Stable lung volumes. Questionable increased pulmonary vascularity, but no overt edema. Otherwise allowing for portable technique the lungs are clear. Visualized tracheal air column is within  normal limits. No pneumothorax. No acute osseous abnormality identified. IMPRESSION: Stable cardiomegaly. Increased pulmonary vascularity but no overt edema. Electronically Signed   By: Genevie Ann M.D.   On: 10/17/2018 01:38    EKG: Independently reviewed.  Atrial fibrillation with RVR at 128 bpm.  Assessment/Plan Atrial fibrillation with RVR: Acute on chronic.  Patient presents with atrial fibrillation RVR with heart rates into the 130s which patient was started on a diltiazem drip.  Status post cardioversion in 09/2017.  Previously, notes being on sotalol and Cardizem,  but unable to afford those medications after losing his job. CHA2DS2-VASc score =3. -Admit to a progressive bed  -Cardizem drip was discontinued by cardiology and started on Coreg -Add on TSH -Goal magnesium 2 and potassium 4 -Consulted cardiology, we  will follow-up for further recommendations -Care management consult as patient lacks insurance and PCP  Systolic heart failure: Chronic.  Patient with trace lower extremity edema on physical exam.  BNP elevated at 419.1.  Chest x-ray noting cardiomegaly with pulmonary vascular congestion and no signs of overt pulmonary edema.  Last EF noted to be 45 to 50% and 09/2017. -Strict intake and output -Daily weights  -Follow-up repeat echocardiogram  Sinusitis: Acute.  Patient reports having sinus pressure, congestion, and eye irritation.  Tenderness to palpation of the frontal and maxillary sinuses. -Augmentin p.o. -Claritin and Flonase needed  Leukocytosis: Acute.  WBC elevated at 10.9.  COVID-19 negative.  Suspect could be secondary to above.   -Recheck CBC in  Hypertensive urgency: Acute.  On admission blood pressures elevated up to 192/36.   Patient not currently on any blood pressure medication -Restart losartan  -Hydralazine IV as needed   Hypokalemia: Acute. Potassium noted to be 3.3 on admission. -Give 40 mEq of potassium chloride p.o. -Continue to monitor and replace as needed  Diabetes mellitus type 2, uncontrolled: Glucose elevated up to 366 on admission. Last hemoglobin A1c noted to be 8.3 in 09/2017.   -Hypoglycemic protocol -Check hemoglobin A1c (9.9)  -CBGs q. before meals and at bedtime with moderate SSI -Levemir 7 units nightly -Diabetic education consult  Suspect sleep apnea -Would benefit from outpatient sleep study  Transaminitis, hyperbilirubinemia: Acute.  AST 45, ALT 59, and total bilirubin 1.4.  Suspect could be secondary to hypoperfusion of the liver. -Recheck liver enzymes and bilirubin levels in a.m.    DVT prophylaxis: Eliquis  Code Status: full Family Communication: No family present at bedside Disposition Plan: Possible discharge home in 1 to 2 days Consults called: cardiology Admission status: Observation  Norval Morton MD [facility]  Pager [phone]   If 7PM-7AM, please contact night-coverage [email or web address] Password [facility]  10/17/2018, 11:47 AM

## 2018-10-17 NOTE — ED Triage Notes (Signed)
Patient arrived via POV c/o shortness of breath x 2 weeks. Patient states sob is intermittent , usually worse at night. Patient is AO x 4, with increased BP, RR, HR.

## 2018-10-17 NOTE — ED Notes (Signed)
Patient breathing easier, with RR and HR now within WDL.

## 2018-10-17 NOTE — Progress Notes (Signed)
Inpatient Diabetes Program Recommendations  AACE/ADA: New Consensus Statement on Inpatient Glycemic Control (2015)  Target Ranges:  Prepandial:   less than 140 mg/dL      Peak postprandial:   less than 180 mg/dL (1-2 hours)      Critically ill patients:  140 - 180 mg/dL   Lab Results  Component Value Date   GLUCAP 190 (H) 10/17/2018   HGBA1C 9.9 (H) 10/17/2018    Review of Glycemic Control  Diabetes history: DM2 Outpatient Diabetes medications: metformin 500 mg QAM Current orders for Inpatient glycemic control: Novolog 0-15 units tidwc and hs  HgbA1C - 9.9% - uncontrolled  Inpatient Diabetes Program Recommendations:     Add Lantus 12 units QHS Add Novolog 3 units tidwc for meal coverage insulin if pt eats > 50% meal. Hold if pt is NPO.  PCP - [place]. Pt will need to f/u within 1-2 weeks of discharge. Will need glucose meter and supplies, along with DM meds (OHAs and insulin?) Should be able to get meds at [place].  Will secure text MD regarding pt going home on insulin. Will speak with pt about HgbA1C and importance of controlling blood sugars and f/u with MD, prior to discharge.  Continue to follow.  Thank you. Lorenda Peck, RD, LDN, CDE Inpatient Diabetes Coordinator [phone]

## 2018-10-17 NOTE — Consult Note (Addendum)
Cardiology Consultation:   Patient ID: David [name]; [id number]; [birth year]   Admit date: 10/17/2018 Date of Consult: 10/17/2018  Primary Care Provider: Patient, No Pcp Per Primary Cardiologist: Charlton Haws, MD Primary Electrophysiologist:  None  Chief Complaint: shortness of breath  Patient Profile:   David [name] is a 58 y.o. male with a hx of persistent atrial fibrillation, mild cardiomyopathy, untreated DM (A1C 9.9 this admission), suspected OSA and noncompliance who is being seen today for the evaluation of rapid atrial fib at the request of Dr. Katrinka Blazing.  History of Present Illness:   He is originally from [place] and moved to the area in 2019. He had not been following with regular primary care. He presented 09/2017 with new onset AF, HTN, hyperglycemia (CBG 322) and suspected OSA. 2D echo showed EF 50-55%. He underwent TEE/DCCV showing EF 45-50% with diffuse HK, mild MR, severely dilated LA -> unsuccessful restoration of NSR. He underwent nuclear stress test that showed "reversibility along the anterolateral wall in the mid segment is an equivocal finding, No other areas are concerning for reversible ischemia, mild diffuse hypokinesia throughout the left ventricle, EF 44%" felt intermediate risk. It was favored that this may be due to rapid AF and HTN as there was normalization artifact. It was recommended to focus on rx of his AF. He was placed on sotalol/Eliquis with plan for repeat attempt at DCCV after 3-4 weeks. Sleep study was also ordered. He failed to follow up. He has no insurance. He lost his job 6 months ago and has not had money to pay for his medications. CHADSVASC 3 for HTN, cardiomyopathy, DM. 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  He presented to [facility] ER overnight with 2-week history of SOB with exertion, with cough and sinus pressure/nasal congestion. Cough is worse at night. He also had what he calls pinkeye 3 days ago. TMax was 99.6 in the  ER yesterday. He has not had any chest pain whatsoever. Last night he had such severe SOB with minimal exertion that he woke his roommate up to drive [facility] to [facility]. He was found to be in AF RVR with accelerated HTN with BP 178/153. He has multiple abnormal labs with CBG 366, pseudohyponatremia of 132, hypokalemia of 3.3, Mg 1.7, AST/ALT mildly elevated at 45/59, hypoalbuminemia of 3.0, BNP 419, mild leukocytosis of 10.9 with normal H/H, normal TSH, Covid negative. CXR showed stable cardiomegaly with increased pulmonary vascularity but no overt edema. He received 20mg  IV diltiazem + drip, 10 units Novolog and was started on Eliquis. HR 80s on diltiazem gtt -> feeling much better but still with cough and nasal congestion. IM has started Augmentin.   Past Medical History:  Diagnosis Date   Cardiomyopathy (HCC)    a. EF 45% in 2019.   Chronic anticoagulation 09/30/2017   Diabetes mellitus type 2 in nonobese [facility])    Does not have health insurance    Essential hypertension 09/26/2017   Financial difficulties    H/O noncompliance with medical treatment, presenting hazards to health    Non-insulin treated type 2 diabetes mellitus (HCC) 09/26/2017   Persistent atrial fibrillation    Sleep apnea suspected 09/30/2017    Past Surgical History:  Procedure Laterality Date   APPENDECTOMY  1971   CARDIOVERSION N/A 09/28/2017   Procedure: CARDIOVERSION;  Surgeon: Thurmon Fair, MD;  Location: [facility] ENDOSCOPY;  Service: Cardiovascular;  Laterality: N/A;   TEE WITHOUT CARDIOVERSION N/A 09/28/2017   Procedure: TRANSESOPHAGEAL  ECHOCARDIOGRAM (TEE);  Surgeon: Thurmon Fairroitoru, Mihai, MD;  Location: [facility] ENDOSCOPY;  Service: Cardiovascular;  Laterality: N/A;     Inpatient Medications: Scheduled Meds:  amoxicillin-clavulanate  1 tablet Oral Q12H   apixaban  5 mg Oral BID   fluticasone  1 spray Each Nare Daily   insulin aspart  0-15 Units Subcutaneous TID WC   insulin aspart  0-5 Units Subcutaneous QHS    living well with diabetes book   Does not apply Once   loratadine  10 mg Oral Daily   losartan  25 mg Oral Daily   potassium chloride  40 mEq Oral STAT   sodium chloride flush  3 mL Intravenous Q12H   Continuous Infusions:  diltiazem (CARDIZEM) infusion 5 mg/hr (10/17/18 0155)   magnesium sulfate bolus IVPB     PRN Meds: acetaminophen **OR** acetaminophen, albuterol, ondansetron **OR** ondansetron (ZOFRAN) IV  Home Meds: Prior to Admission medications   Medication Sig Start Date End Date Taking? Authorizing Provider  acetaminophen (TYLENOL) 325 MG tablet Take 2 tablets (650 mg total) by mouth every 4 (four) hours as needed for headache or mild pain. Patient not taking: Reported on 10/17/2018 09/30/17   Abelino DerrickKilroy, Luke K, PA-C  apixaban (ELIQUIS) 5 MG TABS tablet Take 1 tablet (5 mg total) by mouth 2 (two) times daily. Patient not taking: Reported on 10/17/2018 09/30/17   Abelino DerrickKilroy, Luke K, PA-C  diltiazem (CARDIZEM LA) 120 MG 24 hr tablet Take 1 tablet (120 mg total) by mouth daily. Patient not taking: Reported on 10/17/2018 09/30/17   Abelino DerrickKilroy, Luke K, PA-C  losartan (COZAAR) 25 MG tablet Take 1 tablet (25 mg total) by mouth daily. Patient not taking: Reported on 10/17/2018 10/01/17   Abelino DerrickKilroy, Luke K, PA-C  metFORMIN (GLUCOPHAGE) 500 MG tablet Take 1 tablet (500 mg total) by mouth daily with breakfast. Patient not taking: Reported on 10/17/2018 10/01/17   Abelino DerrickKilroy, Luke K, PA-C  sotalol (BETAPACE) 80 MG tablet Take 1 tablet (80 mg total) by mouth every 12 (twelve) hours. Patient not taking: Reported on 10/17/2018 09/30/17   Abelino DerrickKilroy, Luke K, PA-C    Allergies:   No Known Allergies  Social History:   Social History   Socioeconomic History   Marital status: Married    Spouse name: Not on file   Number of children: Not on file   Years of education: Not on file   Highest education level: Not on file  Occupational History   Not on file  Social Needs   Financial resource strain: Not on  file   Food insecurity    Worry: Not on file    Inability: Not on file   Transportation needs    Medical: Not on file    Non-medical: Not on file  Tobacco Use   Smoking status: Former Smoker    Years: 15.00    Types: Cigarettes    Quit date: 03/18/2018    Years since quitting: 0.5   Smokeless tobacco: Never Used   Tobacco comment: 09/26/2017 "2-3 cigarettes/month now"  Substance and Sexual Activity   Alcohol use: Yes    Alcohol/week: 3.0 standard drinks    Types: 3 Cans of beer per week   Drug use: Never   Sexual activity: Not Currently  Lifestyle   Physical activity    Days per week: Not on file    Minutes per session: Not on file   Stress: Very much  Relationships   Social connections    Talks on phone: Not on file  Gets together: Not on file    Attends religious service: Not on file    Active member of club or organization: Not on file    Attends meetings of clubs or organizations: Not on file    Relationship status: Not on file   Intimate partner violence    Fear of current or ex partner: Not on file    Emotionally abused: Not on file    Physically abused: Not on file    Forced sexual activity: Not on file  Other Topics Concern   Not on file  Social History Narrative   Not on file    Family History:   The patient's family history includes Hypertension in his mother.  ROS:  Please see the history of present illness.  All other ROS reviewed and negative.     Physical Exam/Data:   Vitals:   10/17/18 0900 10/17/18 0930 10/17/18 0941 10/17/18 1111  BP: (!) 129/97 (!) 122/107  (!) 129/103  Pulse: 85 85    Resp: (!) 32 17    Temp:    98.1 F (36.7 C)  TempSrc:    Oral  SpO2: 95% 95% 98%   Weight:      Height:       No intake or output data in the 24 hours ending 10/17/18 1538 Last 3 Weights 10/17/2018 09/30/2017 09/29/2017  Weight (lbs) 200 lb 190 lb 14.4 oz 194 lb 6.4 oz  Weight (kg) 90.719 kg 86.592 kg 88.179 kg    Body mass index is  27.12 kg/m.  General: Well developed, well nourished WM, in no acute distress. Lying flat in bed Head: Normocephalic, atraumatic, sclera non-icteric, no xanthomas, nares are without discharge.  Neck: Negative for carotid bruits. JVD not elevated. Lungs: Clear bilaterally to auscultation without wheezes, rales, or rhonchi. Breathing is unlabored. Heart: Irregularly irregular, rate controlled, with S1 S2. No murmurs, rubs, or gallops appreciated. Abdomen: Soft, non-tender, non-distended with normoactive bowel sounds. No hepatomegaly. No rebound/guarding. No obvious abdominal masses. Msk:  Strength and tone appear normal for age. Extremities: No clubbing or cyanosis. No edema. Distal pedal pulses are 2+ and equal bilaterally. Neuro: Alert and oriented X 3. No facial asymmetry. No focal deficit. Moves all extremities spontaneously. Psych:  Responds to questions appropriately with a normal affect.  EKG:  The EKG was personally reviewed and demonstrates atrial fib RVR 128bpm, right axis deviation, possible prior anteroseptal infarct, baseline wander, nonspecific STT changes  Relevant CV Studies: TEE 09/28/17 Study Conclusions  - Left ventricle: The cavity size was normal. Wall thickness was   normal. Systolic function was normal. The estimated ejection   fraction was in the range of 45% to 50%. Diffuse hypokinesis. - Mitral valve: There was mild regurgitation directed centrally. - Left atrium: The atrium was severely dilated. No evidence of   thrombus in the atrial cavity or appendage. The appendage was   morphologically a left appendage, multilobulated, and small.   Emptying velocity was reduced. - Right atrium: No evidence of thrombus in the atrial cavity or   appendage. - Pulmonic valve: No evidence of vegetation.  Laboratory Data:  Chemistry Recent Labs  Lab 10/17/18 0109  NA 132*  K 3.3*  CL 101  CO2 20*  GLUCOSE 366*  BUN 12  CREATININE 0.99  CALCIUM 8.3*  GFRNONAA >60    GFRAA >60  ANIONGAP 11    Recent Labs  Lab 10/17/18 0109  PROT 6.6  ALBUMIN 3.0*  AST 45*  ALT 59*  ALKPHOS  94  BILITOT 1.4*   Hematology Recent Labs  Lab 10/17/18 0109  WBC 10.9*  RBC 5.07  HGB 16.6  HCT 49.2  MCV 97.0  MCH 32.7  MCHC 33.7  RDW 12.5  PLT 218   Cardiac EnzymesNo results for input(s): TROPONINI in the last 168 hours. No results for input(s): TROPIPOC in the last 168 hours.  BNP Recent Labs  Lab 10/17/18 0109  BNP 419.1*    DDimer No results for input(s): DDIMER in the last 168 hours.  Radiology/Studies:  Dg Chest Port 1 View  Result Date: 10/17/2018 CLINICAL DATA:  58 year old male with shortness of breath for 2 weeks. EXAM: PORTABLE CHEST 1 VIEW COMPARISON:  Portable chest 09/26/2017 and earlier. FINDINGS: Portable AP upright view at 0104 hours. Stable cardiomegaly and mediastinal contours. Stable lung volumes. Questionable increased pulmonary vascularity, but no overt edema. Otherwise allowing for portable technique the lungs are clear. Visualized tracheal air column is within normal limits. No pneumothorax. No acute osseous abnormality identified. IMPRESSION: Stable cardiomegaly. Increased pulmonary vascularity but no overt edema. Electronically Signed   By: Genevie Ann M.D.   On: 10/17/2018 01:38    Assessment and Plan:   1. Recurrent rapid atrial fibrillation - in 09/2017 did not hold DCCV so do not feel there is an acute role for repeating here. May benefit from trial of rate control + Eliquis and if he maintains compliance, consider DCCV as OP. Consider transition of dilt gtt to beta blocker.   2. Suspected acute systolic CHF - per Dr. Jacalyn Lefevre preliminary read, EF around 30-35%. Given h/o AF back to 2019 suspect this could be tachycardia mediated. However, with untreated DM and previously read intermediate nuclear stress test, cannot exclude a component of ischemic cardiomyopathy. Would recommend first to treat AF and reassess LVEF as OP after 3  months of treatment. If LVEF remains depressed would need to consider ischemic eval (might be candidate for cardiac CT if asymptomatic). Will check lipids in AM for risk stratification. Will discuss med titration with MD.  3. Accelerated HTN - follow with anticipated medicine changes. TSH normal.  4. Hypokalemia/hypomagnesemia - IM has ordered KCl and mag sulfate. F/u lytes in AM.  5. Untreated uncontrolled DM type 2 - per IM. Needs close OP f/u.  6. Previously suspected sleep apnea - will need to revisit idea of sleep study as OP once patient has improved financial situation.  7. Abnormal LFTs - ? Etiology. F/u value in AM.  8. Possible sinusitis - sounds like this may have exacerbated current episode of AF, although not sure he ever went out to begin with after 2019. IM has started Augmentin. Covid negative. May need cough suppression QHS per patient report - will defer to primary team.  For questions or updates, please contact [place] Please consult [email or web address] for contact info under Cardiology/STEMI.    Signed, Charlie Pitter, PA-C  10/17/2018 3:38 PM  As above, patient seen and examined.  Briefly he is a 58 year old male with past medical history of diabetes mellitus, persistent atrial fibrillation, mild cardiomyopathy, obstructive sleep apnea and noncompliance admitted with CHF symptoms, URI and atrial fibrillation.  Cardiology now asked to evaluate.  Patient has taken no medications in the past 5 to 6 months.  He states he could not afford.  Over the past 3 weeks he notices increasing dyspnea on exertion, orthopnea and pedal edema.  No chest pain, palpitations or syncope.  He also has had nasal congestion, low-grade fever and mildly productive  cough. Laboratory show potassium 3.3, glucose 366, SGOT 45, SGPT 59, BNP 419.  TSH normal Electrocardiogram shows atrial fibrillation with rapid ventricular response, right axis deviation, cannot rule out prior septal infarct, nonspecific  ST changes.  1 persistent atrial fibrillation-patient failed cardioversion in the past and was placed on sotalol with plans for repeat attempt at cardioversion.  However this did not occur.  Attempts at repeat cardioversion will likely be unsuccessful.  For now we will plan rate control.  Discontinue Cardizem as he appears to have a cardiomyopathy on his echocardiogram (I personally reviewed an ejection fraction is in the 30 to 35% range).  Begin carvedilol 6.25 mg twice daily.  We will increase as needed and potentially also add digoxin if necessary. CHADsvasc 3.  Add apixaban 5 mg twice daily.  If patient is compliant with apixaban we could consider attempt at repeat cardioversion as an outpatient.  Note TSH is normal.  2 cardiomyopathy-possibly secondary to uncontrolled hypertension.  Tachycardia could also be contributing.  Cannot exclude coronary disease.  No history of alcohol abuse.  Plan will be to control heart rate and blood pressure and then repeat echocardiogram in 3 months.  If LV function remains decreased would need ischemia evaluation at that time.  We will add carvedilol both for cardiomyopathy and rate control of atrial fibrillation.  Continue losartan and transition to [facility] tomorrow if blood pressure allows.  3 acute systolic congestive heart failure-we will diurese with Lasix 20 mg twice daily.  Follow renal function.  We will add Spironolactone in the next 24 to 48 hours.  4 noncompliance-patient needs care management consult to assist with medications.  We discussed the importance of being compliant with medications and follow-up.  5 diabetes mellitus-hemoglobin A1c 9.9.  Further management per primary care.  6 hypertension-medications as outlined above.  Follow blood pressure and advance as needed.  7 URI-coronavirus is negative.  Olga Millers, MD

## 2018-10-18 DIAGNOSIS — I10 Essential (primary) hypertension: Secondary | ICD-10-CM

## 2018-10-18 DIAGNOSIS — I5020 Unspecified systolic (congestive) heart failure: Secondary | ICD-10-CM

## 2018-10-18 DIAGNOSIS — E119 Type 2 diabetes mellitus without complications: Secondary | ICD-10-CM

## 2018-10-18 DIAGNOSIS — I509 Heart failure, unspecified: Secondary | ICD-10-CM

## 2018-10-18 DIAGNOSIS — I5022 Chronic systolic (congestive) heart failure: Secondary | ICD-10-CM

## 2018-10-18 LAB — BASIC METABOLIC PANEL
Anion gap: 10 (ref 5–15)
BUN: 9 mg/dL (ref 6–20)
CO2: 25 mmol/L (ref 22–32)
Calcium: 8.4 mg/dL — ABNORMAL LOW (ref 8.9–10.3)
Chloride: 101 mmol/L (ref 98–111)
Creatinine, Ser: 1.03 mg/dL (ref 0.61–1.24)
GFR calc Af Amer: >60 mL/min (ref >60)
GFR calc non Af Amer: >60 mL/min (ref >60)
Glucose, Bld: 238 mg/dL — ABNORMAL HIGH (ref 70–99)
Potassium: 3.5 mmol/L (ref 3.5–5.1)
Sodium: 136 mmol/L (ref 135–145)

## 2018-10-18 LAB — GLUCOSE, CAPILLARY
Glucose-Capillary: 242 mg/dL — ABNORMAL HIGH (ref 70–99)
Glucose-Capillary: 263 mg/dL — ABNORMAL HIGH (ref 70–99)
Glucose-Capillary: 341 mg/dL — ABNORMAL HIGH (ref 70–99)
Glucose-Capillary: 248 mg/dL — ABNORMAL HIGH (ref 70–99)

## 2018-10-18 LAB — SARS CORONAVIRUS 2 BY RT PCR (HOSPITAL ORDER, PERFORMED IN ~~LOC~~ HOSPITAL LAB): SARS Coronavirus 2: NEGATIVE

## 2018-10-18 LAB — CBC
HCT: 45.6 % (ref 39.0–52.0)
Hemoglobin: 15.7 g/dL (ref 13.0–17.0)
MCH: 32.8 pg (ref 26.0–34.0)
MCHC: 34.4 g/dL (ref 30.0–36.0)
MCV: 95.2 fL (ref 80.0–100.0)
Platelets: 222 10*3/uL (ref 150–400)
RBC: 4.79 MIL/uL (ref 4.22–5.81)
RDW: 12.6 % (ref 11.5–15.5)
WBC: 10.2 10*3/uL (ref 4.0–10.5)
nRBC: 0 % (ref 0.0–0.2)

## 2018-10-18 LAB — HEPATIC FUNCTION PANEL
ALT: 44 U/L (ref 0–44)
AST: 26 U/L (ref 15–41)
Albumin: 2.6 g/dL — ABNORMAL LOW (ref 3.5–5.0)
Alkaline Phosphatase: 79 U/L (ref 38–126)
Bilirubin, Direct: 0.3 mg/dL — ABNORMAL HIGH (ref 0.0–0.2)
Indirect Bilirubin: 1.5 mg/dL — ABNORMAL HIGH (ref 0.3–0.9)
Total Bilirubin: 1.8 mg/dL — ABNORMAL HIGH (ref 0.3–1.2)
Total Protein: 5.7 g/dL — ABNORMAL LOW (ref 6.5–8.1)

## 2018-10-18 LAB — LIPID PANEL
Cholesterol: 215 mg/dL — ABNORMAL HIGH (ref 0–200)
HDL: 33 mg/dL — ABNORMAL LOW (ref >40)
LDL Cholesterol: 161 mg/dL — ABNORMAL HIGH (ref 0–99)
Total CHOL/HDL Ratio: 6.5 RATIO
Triglycerides: 103 mg/dL (ref <150)
VLDL: 21 mg/dL (ref 0–40)

## 2018-10-18 LAB — MAGNESIUM: Magnesium: 1.7 mg/dL (ref 1.7–2.4)

## 2018-10-18 MED ORDER — CARVEDILOL 12.5 MG PO TABS
12.5000 mg | ORAL_TABLET | Freq: Two times a day (BID) | ORAL | Status: DC
  Administered 2018-10-18 – 2018-10-20 (×4): 12.5 mg via ORAL
  Filled 2018-10-18 (×4): qty 1

## 2018-10-18 MED ORDER — FUROSEMIDE 10 MG/ML IJ SOLN
40.0000 mg | Freq: Two times a day (BID) | INTRAMUSCULAR | Status: DC
  Administered 2018-10-18 – 2018-10-20 (×4): 40 mg via INTRAVENOUS
  Filled 2018-10-18 (×4): qty 4

## 2018-10-18 MED ORDER — FUROSEMIDE 10 MG/ML IJ SOLN
40.0000 mg | Freq: Once | INTRAMUSCULAR | Status: AC
  Administered 2018-10-18: 40 mg via INTRAVENOUS
  Filled 2018-10-18: qty 4

## 2018-10-18 MED ORDER — MAGNESIUM SULFATE 2 GM/50ML IV SOLN
2.0000 g | Freq: Once | INTRAVENOUS | Status: AC
  Administered 2018-10-18: 2 g via INTRAVENOUS
  Filled 2018-10-18: qty 50

## 2018-10-18 MED ORDER — SPIRONOLACTONE 12.5 MG HALF TABLET
12.5000 mg | ORAL_TABLET | Freq: Every day | ORAL | Status: DC
  Administered 2018-10-18 – 2018-10-20 (×3): 12.5 mg via ORAL
  Filled 2018-10-18 (×3): qty 1

## 2018-10-18 MED ORDER — SALINE SPRAY 0.65 % NA SOLN
1.0000 | NASAL | Status: DC
  Administered 2018-10-18 – 2018-10-19 (×9): 1 via NASAL
  Filled 2018-10-18: qty 44

## 2018-10-18 NOTE — Plan of Care (Signed)

## 2018-10-18 NOTE — Progress Notes (Signed)
Inpatient Diabetes Program Recommendations  AACE/ADA: New Consensus Statement on Inpatient Glycemic Control (2015)  Target Ranges:  Prepandial:   less than 140 mg/dL      Peak postprandial:   less than 180 mg/dL (1-2 hours)      Critically ill patients:  140 - 180 mg/dL   Lab Results  Component Value Date   GLUCAP 341 (H) 10/18/2018   HGBA1C 9.9 (H) 10/17/2018    Review of Glycemic Control  CBGs 176-304 over past 24H. Will likely need to go home on insulin.  Spoke with pt about his HgbA1C of 9.9%. Pt states his blood sugars "haven't been a problem" until this admission. Does not check blood sugars at home. Does not have a PCP. Pt was previously on metformin, but MD took him off. Pt thought his blood sugars were ok. 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 Has been drinking juices. Explained importance of getting HgbA1C down to around 7%, checking blood sugars at home and obtaining PCP to manage his diabetes. Discussed impact of nutrition, exercise, stress, sickness, and medications on diabetes control. Reviewed contents of Living Well with diabetes booklet and encouraged patient to read through entire book.  Pt has appt with [facility] on 7/31.  Recommendations:  Increase Levemir to 12 units QHS Add Novolog 4 units tidwc for meal coverage insulin. (Hold if NPO)  Will f/u in am.   Thank you. Lorenda Peck, RD, LDN, CDE Inpatient Diabetes Coordinator [phone]

## 2018-10-18 NOTE — Progress Notes (Signed)
Progress Note  Patient Name: David [name] Date of Encounter: 10/18/2018  Primary Cardiologist: Charlton Haws, MD   Subjective   No CP; complains of productive cough; complains of dyspnea  Inpatient Medications    Scheduled Meds: . amoxicillin-clavulanate  1 tablet Oral Q12H  . apixaban  5 mg Oral BID  . carvedilol  6.25 mg Oral BID WC  . fluticasone  1 spray Each Nare Daily  . furosemide  20 mg Intravenous BID  . guaiFENesin  600 mg Oral BID  . insulin aspart  0-15 Units Subcutaneous TID WC  . insulin aspart  0-5 Units Subcutaneous QHS  . insulin detemir  7 Units Subcutaneous QHS  . loratadine  10 mg Oral Daily  . losartan  25 mg Oral Daily  . potassium chloride  20 mEq Oral BID  . potassium chloride  40 mEq Oral STAT  . sodium chloride flush  3 mL Intravenous Q12H   Continuous Infusions:  PRN Meds: acetaminophen **OR** acetaminophen, albuterol, ondansetron **OR** ondansetron (ZOFRAN) IV   Vital Signs    Vitals:   10/17/18 1912 10/18/18 0312 10/18/18 0625 10/18/18 0723  BP: 113/82 109/83  (!) 140/105  Pulse: 96 88  (!) 116  Resp:  (!) 31  (!) 38  Temp: 98.6 F (37 C) 98.5 F (36.9 C)  99.3 F (37.4 C)  TempSrc: Oral Oral  Oral  SpO2: 97% 97%  94%  Weight:   89.8 kg   Height:        Intake/Output Summary (Last 24 hours) at 10/18/2018 0917 Last data filed at 10/18/2018 0729 Gross per 24 hour  Intake 480 ml  Output 400 ml  Net 80 ml   Last 3 Weights 10/18/2018 10/17/2018 09/30/2017  Weight (lbs) 197 lb 15.6 oz 200 lb 190 lb 14.4 oz  Weight (kg) 89.8 kg 90.719 kg 86.592 kg      Telemetry    Atrial fibrillation rate upper normal to mildly increased - Personally Reviewed  Physical Exam   GEN: WD coughing Neck: supple Cardiac: irregular and tachycardic Respiratory: Wheeze GI: Soft, nontender, non-distended  MS: No edema Neuro:  Nonfocal  Psych: Normal affect   Labs     Chemistry Recent Labs  Lab 10/17/18 0109 10/18/18 0740  NA 132* 136  K  3.3* 3.5  CL 101 101  CO2 20* 25  GLUCOSE 366* 238*  BUN 12 9  CREATININE 0.99 1.03  CALCIUM 8.3* 8.4*  PROT 6.6 5.7*  ALBUMIN 3.0* 2.6*  AST 45* 26  ALT 59* 44  ALKPHOS 94 79  BILITOT 1.4* 1.8*  GFRNONAA >60 >60  GFRAA >60 >60  ANIONGAP 11 10     Hematology Recent Labs  Lab 10/17/18 0109 10/18/18 0740  WBC 10.9* 10.2  RBC 5.07 4.79  HGB 16.6 15.7  HCT 49.2 45.6  MCV 97.0 95.2  MCH 32.7 32.8  MCHC 33.7 34.4  RDW 12.5 12.6  PLT 218 222    BNP Recent Labs  Lab 10/17/18 0109  BNP 419.1*     Radiology    Dg Chest Port 1 View  Result Date: 10/17/2018 CLINICAL DATA:  58 year old male with shortness of breath for 2 weeks. EXAM: PORTABLE CHEST 1 VIEW COMPARISON:  Portable chest 09/26/2017 and earlier. FINDINGS: Portable AP upright view at 0104 hours. Stable cardiomegaly and mediastinal contours. Stable lung volumes. Questionable increased pulmonary vascularity, but no overt edema. Otherwise allowing for portable technique the lungs are clear. Visualized tracheal air column is within normal limits.  No pneumothorax. No acute osseous abnormality identified. IMPRESSION: Stable cardiomegaly. Increased pulmonary vascularity but no overt edema. Electronically Signed   By: Genevie Ann M.D.   On: 10/17/2018 01:38    Patient Profile     58 year old male with past medical history of diabetes mellitus, persistent atrial fibrillation, mild cardiomyopathy, obstructive sleep apnea and noncompliance admitted with CHF symptoms, URI and atrial fibrillation.  Cardiology now asked to evaluate.  Patient has taken no medications in the past 5 to 6 months.  Echocardiogram this admission shows ejection fraction 35 to 40%.   Assessment & Plan    1 persistent atrial fibrillation-patient failed cardioversion in the past (> 1 year ago) and was placed on sotalol with plans for repeat attempt at cardioversion.  However this did not occur.  Attempts at repeat cardioversion will likely be unsuccessful  given duration of atrial fibrillation.  For now we will plan rate control.  Continue carvedilol but increase to 12.5 mg twice daily.  We will increase as needed and potentially also add digoxin if necessary. CHADsvasc 3.  Continue apixaban 5 mg twice daily.  If patient is compliant with apixaban we could consider attempt at repeat cardioversion as an outpatient.  Note TSH is normal.  2 cardiomyopathy-possibly secondary to uncontrolled hypertension or tachycardia.  Cannot exclude coronary disease or viral CM.  No history of alcohol abuse.  Plan will be to control heart rate and blood pressure and then repeat echocardiogram in 3 months.  If LV function remains decreased would need ischemia evaluation at that time.  Continue carvedilol both for cardiomyopathy and rate control of atrial fibrillation.  Continue losartan and transition to [facility] tomorrow if blood pressure allows.  3 acute systolic congestive heart failure-increase lasix to 40 mg BID; add spironolactone 12.5 mg daily; follow renal function.   4 noncompliance-patient needs care management consult to assist with medications.  We discussed the importance of being compliant with medications and follow-up.  5 diabetes mellitus-hemoglobin A1c 9.9.  Further management per primary care.  6 hypertension-medications as outlined above.  Follow blood pressure and advance as needed.  7 URI-coronavirus initially negative; continues with symptoms; will repeat.  For questions or updates, please contact [place] Please consult [email or web address] for contact info under        Signed, Kirk Ruths, MD  10/18/2018, 9:17 AM

## 2018-10-18 NOTE — Plan of Care (Signed)

## 2018-10-18 NOTE — Progress Notes (Addendum)
PROGRESS NOTE    Rodman KeyMark Tortorella  ZOX:[id number] DOB: 02/08/1961 DOA: 10/17/2018 PCP: Patient, No Pcp Per    Brief Narrative:  58 year old male who presented with dyspnea.  He does have significant past medical history for chronic atrial fibrillation, heart failure and type 2 diabetes mellitus.  He reported to worsening dyspnea for about 2 weeks prior to hospitalization, more persistent over last 48 hours, associated with positive orthopnea, dry cough and lower extremity edema. He has not been able to afford his medications due to insurance coverage.  On his initial physical examination his heart rate was 128, blood pressure 192/136, respiratory rate 32, temperature 98.1, oxygen saturation 95%.  His lungs were clear to auscultation bilaterally, heart S1-S2 present irregularly irregular, abdomen soft nontender, trace lower extremity edema.  Sodium 132, potassium 3.3, chloride 101, bicarb 20, glucose 366, BUN 12, creatinine 0.99, BNP 419, white cell count 10.9, hemoglobin 16.6, hematocrit 49.2, platelets 218.  SARS COVID-19 negative.  Chest radiograph with cardiomegaly, bilateral interstitial infiltrates, predominantly at bases with cephalization of the vasculature.  EKG 128 bpm, normal axis, atrial fibrillation rhythm, poor R wave progression, positive LVH, no ST segment or T wave changes.  Patient was admitted to the hospital with the working diagnosis of atrial fibrillation with rapid ventricular response complicated by acute on chronic decompensation of heart failure.    Assessment & Plan:   Principal Problem:   Atrial fibrillation with RVR (HCC) Active Problems:   Essential hypertension   Non-insulin treated type 2 diabetes mellitus (HCC)   Smoker   Cardiomyopathy (HCC)   Sleep apnea suspected   Transaminitis   Hyperbilirubinemia   Leukocytosis   Sinusitis   1. Atrial fibrillation with rapid ventricular response complicated with decompensated acute on chronic systolic heart failure.  Patient continue to be hypervolemic, with positive JVD and rales on lung examination, his oxymetry is 94% on room air but with respiratory rate of 38. Urine output documented only 400 cc, after 2 doses of 20 mg of furosemide.   Agree with increase furosemide but will have one dose now and continue 40 mg IV BID, continue to monitor urine output and renal function.   Echocardiogram with EF 35-40%, moderate concentric left ventricular hypertrophy. Started on losartan, possible transition to entresto in am per cardiology. (patient will need assistance to get medications at discharge)  Patient now off diltiazem drip and continue on carvedilol for rate control. Anticoagulation with apixaban. (patient failed cardioversion in the past as outpatient).  2. Uncontrolled HTN/ urgency. Blood pressure systolic now 109 to 140 systolic. Will continue diuresis with furosemide and carvedilol.   3. T2DM. Will continue glucose cover and monitoring with insulin sliding scale, tolerating po well. His glucose has been elevated, up to 238 this am, will increase levimir to 10 units, at home patient on metformin.   4. Acute sinusitis. Continue flanase and will add saline nasal spry.   5. Hypokalemia and hypomagnesemia. Continue K correction with Kcl, K this am at 3,5. With preserved renal function serum cr at 1,0, will follow on renal panel in am. Will add 2 g Mag sulfate this am.    DVT prophylaxis: apixaban    Code Status: full Family Communication: no family at the bedside  Disposition Plan/ discharge barriers: patient continue hypervolemic, despite IV furosemide on admission, will change to inpatient due to risk of worsening decompensation of heart failure. Transfer to cardiac telemetry.   Body mass index is 26.85 kg/m. Malnutrition Type:      Malnutrition  Characteristics:      Nutrition Interventions:     RN Pressure Injury Documentation:     Consultants:   cardiology   Procedures:      Antimicrobials:       Subjective: Patient is feeling better but not yet back to baseline, continue to have dyspnea, and cough, moderate in intensity, associated with sinus congestion, no chest pain or palpitations, improving with lasix, worse with exertion.   Objective: Vitals:   10/18/18 0312 10/18/18 0625 10/18/18 0723 10/18/18 1129  BP: 109/83  (!) 140/105 110/87  Pulse: 88  (!) 116 94  Resp: (!) 31  (!) 38 (!) 36  Temp: 98.5 F (36.9 C)  99.3 F (37.4 C) 97.8 F (36.6 C)  TempSrc: Oral  Oral Oral  SpO2: 97%  94% 95%  Weight:  89.8 kg    Height:        Intake/Output Summary (Last 24 hours) at 10/18/2018 1140 Last data filed at 10/18/2018 1130 Gross per 24 hour  Intake 720 ml  Output 400 ml  Net 320 ml   Filed Weights   10/17/18 0101 10/18/18 0625  Weight: 90.7 kg 89.8 kg    Examination:   General: deconditioned  Neurology: Awake and alert, non focal  E ENT: no pallor, no icterus, oral mucosa moist Cardiovascular: Moderte JVD. S1-S2 present, rhythmic, no gallops, rubs, or murmurs. No lower extremity edema. Pulmonary: positive breath sounds bilaterally, no wheezing, rhonchi, bilateral wet rales at bases. Gastrointestinal. Abdomen with no organomegaly, non tender, no rebound or guarding Skin. No rashes Musculoskeletal: no joint deformities     Data Reviewed: I have personally reviewed following labs and imaging studies  CBC: Recent Labs  Lab 10/17/18 0109 10/18/18 0740  WBC 10.9* 10.2  NEUTROABS 7.6  --   HGB 16.6 15.7  HCT 49.2 45.6  MCV 97.0 95.2  PLT 218 222   Basic Metabolic Panel: Recent Labs  Lab 10/17/18 0109 10/17/18 1220 10/18/18 0740  NA 132*  --  136  K 3.3*  --  3.5  CL 101  --  101  CO2 20*  --  25  GLUCOSE 366*  --  238*  BUN 12  --  9  CREATININE 0.99  --  1.03  CALCIUM 8.3*  --  8.4*  MG  --  1.7 1.7   GFR: Estimated Creatinine Clearance: 86.9 mL/min (by C-G formula based on SCr of 1.03 mg/dL). Liver Function  Tests: Recent Labs  Lab 10/17/18 0109 10/18/18 0740  AST 45* 26  ALT 59* 44  ALKPHOS 94 79  BILITOT 1.4* 1.8*  PROT 6.6 5.7*  ALBUMIN 3.0* 2.6*   No results for input(s): LIPASE, AMYLASE in the last 168 hours. No results for input(s): AMMONIA in the last 168 hours. Coagulation Profile: No results for input(s): INR, PROTIME in the last 168 hours. Cardiac Enzymes: No results for input(s): CKTOTAL, CKMB, CKMBINDEX, TROPONINI in the last 168 hours. BNP (last 3 results) No results for input(s): PROBNP in the last 8760 hours. HbA1C: Recent Labs    10/17/18 1220  HGBA1C 9.9*   CBG: Recent Labs  Lab 10/17/18 0931 10/17/18 1132 10/17/18 1559 10/17/18 2118 10/18/18 0627  GLUCAP 171* 190* 304* 176* 242*   Lipid Profile: Recent Labs    10/18/18 0740  CHOL 215*  HDL 33*  LDLCALC 161*  TRIG 103  CHOLHDL 6.5   Thyroid Function Tests: Recent Labs    10/17/18 1220  TSH 2.006   Anemia Panel: No results  for input(s): VITAMINB12, FOLATE, FERRITIN, TIBC, IRON, RETICCTPCT in the last 72 hours.    Radiology Studies: I have reviewed all of the imaging during this hospital visit personally     Scheduled Meds:  amoxicillin-clavulanate  1 tablet Oral Q12H   apixaban  5 mg Oral BID   carvedilol  12.5 mg Oral BID WC   fluticasone  1 spray Each Nare Daily   furosemide  40 mg Intravenous BID   guaiFENesin  600 mg Oral BID   insulin aspart  0-15 Units Subcutaneous TID WC   insulin aspart  0-5 Units Subcutaneous QHS   insulin detemir  7 Units Subcutaneous QHS   loratadine  10 mg Oral Daily   losartan  25 mg Oral Daily   potassium chloride  20 mEq Oral BID   potassium chloride  40 mEq Oral STAT   sodium chloride flush  3 mL Intravenous Q12H   spironolactone  12.5 mg Oral Daily   Continuous Infusions:   LOS: 0 days        Giavanni Odonovan Gerome Apley, MD

## 2018-10-19 DIAGNOSIS — E1165 Type 2 diabetes mellitus with hyperglycemia: Secondary | ICD-10-CM

## 2018-10-19 DIAGNOSIS — Z9114 Patient's other noncompliance with medication regimen: Secondary | ICD-10-CM

## 2018-10-19 LAB — GLUCOSE, CAPILLARY
Glucose-Capillary: 214 mg/dL — ABNORMAL HIGH (ref 70–99)
Glucose-Capillary: 239 mg/dL — ABNORMAL HIGH (ref 70–99)
Glucose-Capillary: 119 mg/dL — ABNORMAL HIGH (ref 70–99)
Glucose-Capillary: 196 mg/dL — ABNORMAL HIGH (ref 70–99)

## 2018-10-19 LAB — BASIC METABOLIC PANEL
Anion gap: 8 (ref 5–15)
BUN: 15 mg/dL (ref 6–20)
CO2: 27 mmol/L (ref 22–32)
Calcium: 8.5 mg/dL — ABNORMAL LOW (ref 8.9–10.3)
Chloride: 99 mmol/L (ref 98–111)
Creatinine, Ser: 1.24 mg/dL (ref 0.61–1.24)
GFR calc Af Amer: >60 mL/min (ref >60)
GFR calc non Af Amer: >60 mL/min (ref >60)
Glucose, Bld: 244 mg/dL — ABNORMAL HIGH (ref 70–99)
Potassium: 3.4 mmol/L — ABNORMAL LOW (ref 3.5–5.1)
Sodium: 134 mmol/L — ABNORMAL LOW (ref 135–145)

## 2018-10-19 LAB — MAGNESIUM: Magnesium: 1.8 mg/dL (ref 1.7–2.4)

## 2018-10-19 MED ORDER — FUROSEMIDE 40 MG PO TABS: 40.0000 mg | ORAL_TABLET | Freq: Every day | ORAL | 0 refills | Status: DC

## 2018-10-19 MED ORDER — DIGOXIN 125 MCG PO TABS
0.1250 mg | ORAL_TABLET | Freq: Every day | ORAL | Status: DC
  Administered 2018-10-19 – 2018-10-20 (×2): 0.125 mg via ORAL
  Filled 2018-10-19 (×2): qty 1

## 2018-10-19 MED ORDER — INSULIN PEN NEEDLE 32G X 4 MM MISC: 0 refills | Status: DC

## 2018-10-19 MED ORDER — CARVEDILOL 12.5 MG PO TABS: 12.5000 mg | ORAL_TABLET | Freq: Two times a day (BID) | ORAL | 0 refills | Status: DC

## 2018-10-19 MED ORDER — DIGOXIN 125 MCG PO TABS: 0.1250 mg | ORAL_TABLET | Freq: Every day | ORAL | 0 refills | Status: DC

## 2018-10-19 MED ORDER — SPIRONOLACTONE 25 MG PO TABS: 12.5000 mg | ORAL_TABLET | Freq: Every day | ORAL | 0 refills | Status: DC

## 2018-10-19 MED ORDER — SACUBITRIL-VALSARTAN 24-26 MG PO TABS
1.0000 | ORAL_TABLET | Freq: Two times a day (BID) | ORAL | Status: DC
  Administered 2018-10-19 – 2018-10-20 (×3): 1 via ORAL
  Filled 2018-10-19 (×3): qty 1

## 2018-10-19 MED ORDER — APIXABAN 5 MG PO TABS: 5.0000 mg | ORAL_TABLET | Freq: Two times a day (BID) | ORAL | 0 refills | Status: DC

## 2018-10-19 MED ORDER — INSULIN ASPART 100 UNIT/ML FLEXPEN: 4.0000 [IU] | PEN_INJECTOR | Freq: Three times a day (TID) | SUBCUTANEOUS | 0 refills | Status: DC

## 2018-10-19 MED ORDER — POTASSIUM CHLORIDE CRYS ER 20 MEQ PO TBCR: 20.0000 meq | EXTENDED_RELEASE_TABLET | Freq: Every day | ORAL | 0 refills | Status: DC

## 2018-10-19 MED ORDER — SACUBITRIL-VALSARTAN 24-26 MG PO TABS: 1.0000 | ORAL_TABLET | Freq: Two times a day (BID) | ORAL | 0 refills | Status: DC

## 2018-10-19 MED ORDER — INSULIN DETEMIR 100 UNIT/ML FLEXPEN: 14.0000 [IU] | PEN_INJECTOR | Freq: Every day | SUBCUTANEOUS | 0 refills | Status: DC

## 2018-10-19 MED ORDER — INSULIN DETEMIR 100 UNIT/ML ~~LOC~~ SOLN
12.0000 [IU] | Freq: Every day | SUBCUTANEOUS | Status: DC
  Filled 2018-10-19: qty 0.12

## 2018-10-19 MED ORDER — INSULIN DETEMIR 100 UNIT/ML ~~LOC~~ SOLN
14.0000 [IU] | Freq: Every day | SUBCUTANEOUS | Status: DC
  Administered 2018-10-19: 14 [IU] via SUBCUTANEOUS
  Filled 2018-10-19 (×2): qty 0.14

## 2018-10-19 MED ORDER — INSULIN ASPART 100 UNIT/ML ~~LOC~~ SOLN
4.0000 [IU] | Freq: Three times a day (TID) | SUBCUTANEOUS | Status: DC
  Administered 2018-10-19 – 2018-10-20 (×4): 4 [IU] via SUBCUTANEOUS

## 2018-10-19 MED FILL — !ELIQUIS 5MG TABLET: 5 | 30 days supply | Qty: 60 | Fill #0

## 2018-10-19 MED FILL — CARVEDILOL 12.5 MG TABLET: 12.5 | 30 days supply | Qty: 60 | Fill #0

## 2018-10-19 MED FILL — DIGOXIN 0.125 MG TABLET: 125 | 30 days supply | Qty: 30 | Fill #0

## 2018-10-19 MED FILL — ENTRESTO 24 MG-26 MG TABLET: 24-26 | 30 days supply | Qty: 60 | Fill #0

## 2018-10-19 MED FILL — POTASSIUM CL ER 20 MEQ TABL: 20 | 30 days supply | Qty: 30 | Fill #0

## 2018-10-19 MED FILL — PENTIPS 31G X 8 MM MISC: 31G X 8 MM | 30 days supply | Qty: 100 | Fill #0

## 2018-10-19 MED FILL — LEVEMIR FLEXTOUCH 100 UNITS: 100 | 30 days supply | Qty: 6 | Fill #0

## 2018-10-19 MED FILL — FUROSEMIDE 40 MG TABLET: 40 | 30 days supply | Qty: 30 | Fill #0

## 2018-10-19 MED FILL — NOVOLOG FLEXPEN SYRINGE: 100 | 30 days supply | Qty: 6 | Fill #0

## 2018-10-19 MED FILL — SPIRONOLACTONE 25 MG TABLET: 25 | 30 days supply | Qty: 15 | Fill #0

## 2018-10-19 NOTE — Progress Notes (Signed)
Addendum   Coordinated extensively with DM coordinator and Case Management.  In preparation for possible discharge tomorrow, sent his prescription for Eliquis to [facility] and rest of his medications to [facility] which he can get for free and these pharmacies are closed tomorrow.  Vernell Leep, MD, FACP, [facility]. [facility]  To contact the attending provider between 7A-7P or the covering provider during after hours 7P-7A, please log into the web site [email or web address] and access using universal [place] password for that web site. If you do not have the password, please call the hospital operator.

## 2018-10-19 NOTE — Plan of Care (Signed)

## 2018-10-19 NOTE — Plan of Care (Signed)
Patient is progressing to meet the goals of care.

## 2018-10-19 NOTE — Discharge Instructions (Addendum)
Information on my medicine - ELIQUIS (apixaban)  Why was Eliquis prescribed for you? Eliquis was prescribed for you to reduce the risk of a blood clot forming that can cause a stroke if you have a medical condition called atrial fibrillation (a type of irregular heartbeat).  What do You need to know about Eliquis ? Take your Eliquis TWICE DAILY - one tablet in the morning and one tablet in the evening with or without food. If you have difficulty swallowing the tablet whole please discuss with your pharmacist how to take the medication safely.  Take Eliquis exactly as prescribed by your doctor and DO NOT stop taking Eliquis without talking to the doctor who prescribed the medication.  Stopping may increase your risk of developing a stroke.  Refill your prescription before you run out.  After discharge, you should have regular check-up appointments with your healthcare provider that is prescribing your Eliquis.  In the future your dose may need to be changed if your kidney function or weight changes by a significant amount or as you get older.  What do you do if you miss a dose? If you miss a dose, take it as soon as you remember on the same day and resume taking twice daily.  Do not take more than one dose of ELIQUIS at the same time to make up a missed dose.  Important Safety Information A possible side effect of Eliquis is bleeding. You should call your healthcare provider right away if you experience any of the following: ? Bleeding from an injury or your nose that does not stop. ? Unusual colored urine (red or dark brown) or unusual colored stools (red or black). ? Unusual bruising for unknown reasons. ? A serious fall or if you hit your head (even if there is no bleeding).  Some medicines may interact with Eliquis and might increase your risk of bleeding or clotting while on Eliquis. To help avoid this, consult your healthcare provider or pharmacist prior to using any new  prescription or non-prescription medications, including herbals, vitamins, non-steroidal anti-inflammatory drugs (NSAIDs) and supplements.  This website has more information on Eliquis (apixaban): [email or web address]://[email or web address]    Hemoglobin A1c Test Why am I having this test? You may have the hemoglobin A1c test (HbA1c test) done to:  Evaluate your risk for developing diabetes (diabetes mellitus).  Diagnose diabetes.  Monitor long-term control of blood sugar (glucose) in people who have diabetes and help make treatment decisions. This test may be done with other blood glucose tests, such as fasting blood glucose and oral glucose tolerance tests. What is being tested? Hemoglobin is a type of protein in the blood that carries oxygen. Glucose attaches to hemoglobin to form glycated hemoglobin. This test checks the amount of glycated hemoglobin in your blood, which is a good indicator of the average amount of glucose in your blood during the past 2-3 months. What kind of sample is taken?  A blood sample is required for this test. It is usually collected by inserting a needle into a blood vessel. Tell a health care provider about:  All medicines you are taking, including vitamins, herbs, eye drops, creams, and over-the-counter medicines.  Any blood disorders you have.  Any surgeries you have had.  Any medical conditions you have.  Whether you are pregnant or may be pregnant. How are the results reported? Your results will be reported as a percentage that indicates how much of your hemoglobin has glucose attached to it (is glycated). Your health  care provider will compare your results to normal ranges that were established after testing a large group of people (reference ranges). Reference ranges may vary among labs and hospitals. For this test, common reference ranges are:  Adult or child without diabetes: 4-5.6%.  Adult or child with diabetes and good blood glucose control:  less than 7%. What do the results mean? If you have diabetes:  A result of less than 7% is considered normal, meaning that your blood glucose is well controlled.  A result higher than 7% means that your blood glucose is not well controlled, and your treatment plan may need to be adjusted. If you do not have diabetes:  A result within the reference range is considered normal, meaning that you are not at high risk for diabetes.  A result of 5.7-6.4% means that you have a high risk of developing diabetes, and you may have prediabetes. Prediabetes is the condition of having a blood glucose level that is higher than it should be, but not high enough for you to be diagnosed with diabetes. Having prediabetes puts you at risk for developing type 2 diabetes (type 2 diabetes mellitus). You may have more tests, including a repeat HbA1c test.  Results of 6.5% or higher on two separate HbA1c tests mean that you have diabetes. You may have more tests to confirm the diagnosis. Abnormally low HbA1c values may be caused by:  Pregnancy.  Severe blood loss.  Receiving donated blood (transfusions).  Low red blood cell count (anemia).  Long-term kidney failure.  Some unusual forms (variants) of hemoglobin. Talk with your health care provider about what your results mean. Questions to ask your health care provider Ask your health care provider, or the department that is doing the test:  When will my results be ready?  How will I get my results?  What are my treatment options?  What other tests do I need?  What are my next steps? Summary  The hemoglobin A1c test (HbA1c test) may be done to evaluate your risk for developing diabetes, to diagnose diabetes, and to monitor long-term control of blood sugar (glucose) in people who have diabetes and help make treatment decisions.  Hemoglobin is a type of protein in the blood that carries oxygen. Glucose attaches to hemoglobin to form glycated  hemoglobin. This test checks the amount of glycated hemoglobin in your blood, which is a good indicator of the average amount of glucose in your blood during the past 2-3 months.  Talk with your health care provider about what your results mean. This information is not intended to replace advice given to you by your health care provider. Make sure you discuss any questions you have with your health care provider. Document Released: 04/12/2004 Document Revised: 03/03/2017 Document Reviewed: 11/01/2016 Elsevier Patient Education  [place].  Hypoglycemia Hypoglycemia is when the sugar (glucose) level in your blood is too low. Signs of low blood sugar may include:  Feeling: ? Hungry. ? Worried or nervous (anxious). ? Sweaty and clammy. ? Confused. ? Dizzy. ? Sleepy. ? Sick to your stomach (nauseous).  Having: ? A fast heartbeat. ? A headache. ? A change in your vision. ? Tingling or no feeling (numbness) around your mouth, lips, or tongue. ? Jerky movements that you cannot control (seizure).  Having trouble with: ? Moving (coordination). ? Sleeping. ? Passing out (fainting). ? Getting upset easily (irritability). Low blood sugar can happen to people who have diabetes and people who do not have diabetes.  Low blood sugar can happen quickly, and it can be an emergency. Treating low blood sugar Low blood sugar is often treated by eating or drinking something sugary right away, such as:  Fruit juice, 4-6 oz (120-150 mL).  Regular soda (not diet soda), 4-6 oz (120-150 mL).  Low-fat milk, 4 oz (120 mL).  Several pieces of hard candy.  Sugar or honey, 1 Tbsp (15 mL). Treating low blood sugar if you have diabetes If you can think clearly and swallow safely, follow the 15:15 rule:  Take 15 grams of a fast-acting carb (carbohydrate). Talk with your doctor about how much you should take.  Always keep a source of fast-acting carb with you, such as: ? Sugar tablets (glucose  pills). Take 3-4 pills. ? 6-8 pieces of hard candy. ? 4-6 oz (120-150 mL) of fruit juice. ? 4-6 oz (120-150 mL) of regular (not diet) soda. ? 1 Tbsp (15 mL) honey or sugar.  Check your blood sugar 15 minutes after you take the carb.  If your blood sugar is still at or below 70 mg/dL (3.9 mmol/L), take 15 grams of a carb again.  If your blood sugar does not go above 70 mg/dL (3.9 mmol/L) after 3 tries, get help right away.  After your blood sugar goes back to normal, eat a meal or a snack within 1 hour.  Treating very low blood sugar If your blood sugar is at or below 54 mg/dL (3 mmol/L), you have very low blood sugar (severe hypoglycemia). This may also cause:  Passing out.  Jerky movements you cannot control (seizure).  Losing consciousness (coma). This is an emergency. Do not wait to see if the symptoms will go away. Get medical help right away. Call your local emergency services (911 in the [place] Do not drive yourself to the hospital. If you have very low blood sugar and you cannot eat or drink, you may need a glucagon shot (injection). A family member or friend should learn how to check your blood sugar and how to give you a glucagon shot. Ask your doctor if you need to have a glucagon shot kit at home. Follow these instructions at home: General instructions  Take over-the-counter and prescription medicines only as told by your doctor.  Stay aware of your blood sugar as told by your doctor.  Limit alcohol intake to no more than 1 drink a day for nonpregnant women and 2 drinks a day for men. One drink equals 12 oz of beer (355 mL), 5 oz of wine (148 mL), or 1 oz of hard liquor (44 mL).  Keep all follow-up visits as told by your doctor. This is important. If you have diabetes:   Follow your diabetes care plan as told by your doctor. Make sure you: ? Know the signs of low blood sugar. ? Take your medicines as told. ? Follow your exercise and meal plan. ? Eat on time. Do  not skip meals. ? Check your blood sugar as often as told by your doctor. Always check it before and after exercise. ? Follow your sick day plan when you cannot eat or drink normally. Make this plan ahead of time with your doctor.  Share your diabetes care plan with: ? Your work or school. ? People you live with.  Check your pee (urine) for ketones: ? When you are sick. ? As told by your doctor.  Carry a card or wear jewelry that says you have diabetes. Contact a doctor if:  You  have trouble keeping your blood sugar in your target range.  You have low blood sugar often. Get help right away if:  You still have symptoms after you eat or drink something sugary.  Your blood sugar is at or below 54 mg/dL (3 mmol/L).  You have jerky movements that you cannot control.  You pass out. These symptoms may be an emergency. Do not wait to see if the symptoms will go away. Get medical help right away. Call your local emergency services (911 in the [place] Do not drive yourself to the hospital. Summary  Hypoglycemia happens when the level of sugar (glucose) in your blood is too low.  Low blood sugar can happen to people who have diabetes and people who do not have diabetes. Low blood sugar can happen quickly, and it can be an emergency.  Make sure you know the signs of low blood sugar and know how to treat it.  Always keep a source of sugar (fast-acting carb) with you to treat low blood sugar. This information is not intended to replace advice given to you by your health care provider. Make sure you discuss any questions you have with your health care provider. Document Released: 06/15/2009 Document Revised: 07/12/2018 Document Reviewed: 04/24/2015 Elsevier Patient Education  [place].  Hyperglycemia Hyperglycemia occurs when the level of sugar (glucose) in the blood is too high. Glucose is a type of sugar that provides the body's main source of energy. Certain hormones (insulin  and glucagon) control the level of glucose in the blood. Insulin lowers blood glucose, and glucagon increases blood glucose. Hyperglycemia can result from having too little insulin in the bloodstream, or from the body not responding normally to insulin. Hyperglycemia occurs most often in people who have diabetes (diabetes mellitus), but it can happen in people who do not have diabetes. It can develop quickly, and it can be life-threatening if it causes you to become severely dehydrated (diabetic ketoacidosis or hyperglycemic hyperosmolar state). Severe hyperglycemia is a medical emergency. What are the causes? If you have diabetes, hyperglycemia may be caused by:  Diabetes medicine.  Medicines that increase blood glucose or affect your diabetes control.  Not eating enough, or not eating often enough.  Changes in physical activity level.  Being sick or having an infection. If you have prediabetes or undiagnosed diabetes:  Hyperglycemia may be caused by those conditions. If you do not have diabetes, hyperglycemia may be caused by:  Certain medicines, including steroid medicines, beta-blockers, epinephrine, and thiazide diuretics.  Stress.  Serious illness.  Surgery.  Diseases of the pancreas.  Infection. What increases the risk? Hyperglycemia is more likely to develop in people who have risk factors for diabetes, such as:  Having a family member with diabetes.  Having a gene for type 1 diabetes that is passed from parent to child (inherited).  Living in an area with cold weather conditions.  Exposure to certain viruses.  Certain conditions in which the body's disease-fighting (immune) system attacks itself (autoimmune disorders).  Being overweight or obese.  Having an inactive (sedentary) lifestyle.  Having been diagnosed with insulin resistance.  Having a history of prediabetes, gestational diabetes, or polycystic ovarian syndrome (PCOS).  Being of American-Indian,  African-American, Hispanic/Latino, or Asian/Pacific Islander descent. What are the signs or symptoms? Hyperglycemia may not cause any symptoms. If you do have symptoms, they may include early warning signs, such as:  Increased thirst.  Hunger.  Feeling very tired.  Needing to urinate more often than usual.  Blurry vision. Other symptoms may develop if hyperglycemia gets worse, such as:  Dry mouth.  Loss of appetite.  Fruity-smelling breath.  Weakness.  Unexpected or rapid weight gain or weight loss.  Tingling or numbness in the hands or feet.  Headache.  Skin that does not quickly return to normal after being lightly pinched and released (poor skin turgor).  Abdominal pain.  Cuts or bruises that are slow to heal. How is this diagnosed? Hyperglycemia is diagnosed with a blood test to measure your blood glucose level. This blood test is usually done while you are having symptoms. Your health care provider may also do a physical exam and review your medical history. You may have more tests to determine the cause of your hyperglycemia, such as:  A fasting blood glucose (FBG) test. You will not be allowed to eat (you will fast) for at least 8 hours before a blood sample is taken.  An A1c (hemoglobin A1c) blood test. This provides information about blood glucose control over the previous 2-3 months.  An oral glucose tolerance test (OGTT). This measures your blood glucose at two times: ? After fasting. This is your baseline blood glucose level. ? Two hours after drinking a beverage that contains glucose. How is this treated? Treatment depends on the cause of your hyperglycemia. Treatment may include:  Taking medicine to regulate your blood glucose levels. If you take insulin or other diabetes medicines, your medicine or dosage may be adjusted.  Lifestyle changes, such as exercising more, eating healthier foods, or losing weight.  Treating an illness or infection, if this  caused your hyperglycemia.  Checking your blood glucose more often.  Stopping or reducing steroid medicines, if these caused your hyperglycemia. If your hyperglycemia becomes severe and it results in hyperglycemic hyperosmolar state, you must be hospitalized and given IV fluids. Follow these instructions at home:  General instructions  Take over-the-counter and prescription medicines only as told by your health care provider.  Do not use any products that contain nicotine or tobacco, such as cigarettes and e-cigarettes. If you need help quitting, ask your health care provider.  Limit alcohol intake to no more than 1 drink per day for nonpregnant women and 2 drinks per day for men. One drink equals 12 oz of beer, 5 oz of wine, or 1 oz of hard liquor.  Learn to manage stress. If you need help with this, ask your health care provider.  Keep all follow-up visits as told by your health care provider. This is important. Eating and drinking   Maintain a healthy weight.  Exercise regularly, as directed by your health care provider.  Stay hydrated, especially when you exercise, get sick, or spend time in hot temperatures.  Eat healthy foods, such as: ? Lean proteins. ? Complex carbohydrates. ? Fresh fruits and vegetables. ? Low-fat dairy products. ? Healthy fats.  Drink enough fluid to keep your urine clear or pale yellow. If you have diabetes:  Make sure you know the symptoms of hyperglycemia.  Follow your diabetes management plan, as told by your health care provider. Make sure you: ? Take your insulin and medicines as directed. ? Follow your exercise plan. ? Follow your meal plan. Eat on time, and do not skip meals. ? Check your blood glucose as often as directed. Make sure to check your blood glucose before and after exercise. If you exercise longer or in a different way than usual, check your blood glucose more often. ? Follow your sick day  plan whenever you cannot eat or  drink normally. Make this plan in advance with your health care provider.  Share your diabetes management plan with people in your workplace, school, and household.  Check your urine for ketones when you are ill and as told by your health care provider.  Carry a medical alert card or wear medical alert jewelry. Contact a health care provider if:  Your blood glucose is at or above 240 mg/dL (13.3 mmol/L) for 2 days in a row.  You have problems keeping your blood glucose in your target range.  You have frequent episodes of hyperglycemia. Get help right away if:  You have difficulty breathing.  You have a change in how you think, feel, or act (mental status).  You have nausea or vomiting that does not go away. These symptoms may represent a serious problem that is an emergency. Do not wait to see if the symptoms will go away. Get medical help right away. Call your local emergency services (911 in the [place] Do not drive yourself to the hospital. Summary  Hyperglycemia occurs when the level of sugar (glucose) in the blood is too high.  Hyperglycemia is diagnosed with a blood test to measure your blood glucose level. This blood test is usually done while you are having symptoms. Your health care provider may also do a physical exam and review your medical history.  If you have diabetes, follow your diabetes management plan as told by your health care provider.  Contact your health care provider if you have problems keeping your blood glucose in your target range. This information is not intended to replace advice given to you by your health care provider. Make sure you discuss any questions you have with your health care provider. Document Released: 09/14/2000 Document Revised: 12/07/2015 Document Reviewed: 12/07/2015 Elsevier Patient Education  [place].  Additional discharge instructions: Please get your medications reviewed and adjusted by your Primary MD.  Please  request your Primary MD to go over all Hospital Tests and Procedure/Radiological results at the follow up, please get all Hospital records sent to your Prim MD by signing hospital release before you go home.  If you had Pneumonia of Lung problems at the Hospital: Please get a 2 view Chest X ray done in 6-8 weeks after hospital discharge or sooner if instructed by your Primary MD.  If you have Congestive Heart Failure: Please call your Cardiologist or Primary MD anytime you have any of the following symptoms:  1) 3 pound weight gain in 24 hours or 5 pounds in 1 week  2) shortness of breath, with or without a dry hacking cough  3) swelling in the hands, feet or stomach  4) if you have to sleep on extra pillows at night in order to breathe  Follow cardiac low salt diet and 1.5 lit/day fluid restriction.  If you have diabetes Accuchecks 4 times/day, Once in AM empty stomach and then before each meal. Log in all results and show them to your primary doctor at your next visit. If any glucose reading is under 80 or above 300 call your primary MD immediately.  If you have Seizure/Convulsions/Epilepsy: Please do not drive, operate heavy machinery, participate in activities at heights or participate in high speed sports until you have seen by Primary MD or a Neurologist and advised to do so again.  If you had Gastrointestinal Bleeding: Please ask your Primary MD to check a complete blood count within one week of discharge  or at your next visit. Your endoscopic/colonoscopic biopsies that are pending at the time of discharge, will also need to followed by your Primary MD.  Get Medicines reviewed and adjusted. Please take all your medications with you for your next visit with your Primary MD  Please request your Primary MD to go over all hospital tests and procedure/radiological results at the follow up, please ask your Primary MD to get all Hospital records sent to his/her office.  If you  experience worsening of your admission symptoms, develop shortness of breath, life threatening emergency, suicidal or homicidal thoughts you must seek medical attention immediately by calling 911 or calling your MD immediately  if symptoms less severe.  You must read complete instructions/literature along with all the possible adverse reactions/side effects for all the Medicines you take and that have been prescribed to you. Take any new Medicines after you have completely understood and accpet all the possible adverse reactions/side effects.   Do not drive or operate heavy machinery when taking Pain medications.   Do not take more than prescribed Pain, Sleep and Anxiety Medications  Special Instructions: If you have smoked or chewed Tobacco  in the last 2 yrs please stop smoking, stop any regular Alcohol  and or any Recreational drug use.  Wear Seat belts while driving.  Please note You were cared for by a hospitalist during your hospital stay. If you have any questions about your discharge medications or the care you received while you were in the hospital after you are discharged, you can call the unit and asked to speak with the hospitalist on call if the hospitalist that took care of you is not available. Once you are discharged, your primary care physician will handle any further medical issues. Please note that NO REFILLS for any discharge medications will be authorized once you are discharged, as it is imperative that you return to your primary care physician (or establish a relationship with a primary care physician if you do not have one) for your aftercare needs so that they can reassess your need for medications and monitor your lab values.  You can reach the hospitalist office at phone [phone] or fax [phone]   If you do not have a primary care physician, you can call [phone] for a physician referral.

## 2018-10-19 NOTE — Progress Notes (Addendum)
Inpatient Diabetes Program Recommendations  AACE/ADA: New Consensus Statement on Inpatient Glycemic Control (2015)  Target Ranges:  Prepandial:   less than 140 mg/dL      Peak postprandial:   less than 180 mg/dL (1-2 hours)      Critically ill patients:  140 - 180 mg/dL   Lab Results  Component Value Date   GLUCAP 214 (H) 10/19/2018   HGBA1C 9.9 (H) 10/17/2018    Review of Glycemic Control Results for ARIV, PENROD (MRN [id number]) as of 10/19/2018 10:07  Ref. Range 10/18/2018 11:27 10/18/2018 16:31 10/18/2018 21:23 10/19/2018 06:06  Glucose-Capillary Latest Ref Range: 70 - 99 mg/dL 263 (H) 341 (H) 248 (H) 214 (H)   Diabetes history: Type 2 DM Outpatient Diabetes medications: Metformin 500 mg QAM Current orders for Inpatient glycemic control: Novolog 0-15 units TID, Novolog 0-5 units QHS, Levemir 12 units QHS, Novolog 4 units TID  Inpatient Diabetes Program Recommendations:    Noted order changes this AM. In agreement. Will continue to follow trends.  Plan to see patient again regarding insulin injections.   Addendum @ 1245: Spoke with patient regarding insulin injections and plan for discharge.  Reviewed survival skills, hyper vs hypo glycemia signs and symptoms, Relion products, to ensure injecting with breakfast and dinner and when to call MD.  Educated patient on insulin pen use at home. Reviewed contents of insulin flexpen starter kit. Reviewed all steps if insulin pen including attachment of needle, 2-unit air shot, dialing up dose, giving injection, removing needle, disposal of sharps, storage of unused insulin, disposal of insulin etc. Patient able to provide successful return demonstration. Also reviewed troubleshooting with insulin pen.  MD to give patient Rxs for insulin pens and insulin pen needles.  Noted that patient has follow up appointment with [facility]. Discussed with Neoma Laming, CM that patient is using [facility]. Would recommend Novolog 4 units TID and Levemir 14 units QHS at  discharge. Patient is to receive [facility] letter and denies issues with transportation. Text page sent to Dr Algis Liming regarding update and placing precriptions. CM to pick up from [facility].    Thanks, Bronson Curb, MSN, RNC-OB Diabetes Coordinator [phone] (8a-5p)

## 2018-10-19 NOTE — Progress Notes (Signed)
PROGRESS NOTE   David [name]  KGY:[id number]    DOB: 1960/05/16    DOA: 10/17/2018  PCP: Patient, No Pcp Per   I have briefly reviewed patients previous medical records in [facility].  Chief Complaint  Patient presents with  . 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 fib, mild cardiomyopathy, suspected OSA admitted for A. fib with RVR, acute systolic CHF and poorly controlled DM 2.  Cardiology consulting.  Slowly improving.   Assessment & Plan:   Principal Problem:   Atrial fibrillation with RVR (HCC) Active Problems:   Essential hypertension   Non-insulin treated type 2 diabetes mellitus ([place])   Smoker   Cardiomyopathy ([place])   Sleep apnea suspected   Transaminitis   Hyperbilirubinemia   Leukocytosis   Sinusitis   Heart failure ([place])   Persistent atrial fibrillation admitted with RVR  Cardiology consulted and their input appreciated.  Failed cardioversion >1-year ago, was placed on sotalol with plans for repeat attempted cardioversion but that did not happen and he stopped all his medication several months ago.  Briefly on Cardizem drip on admission, now off.  Continue rate control with carvedilol 12.5 mg twice daily.  Added digoxin 0.125 mg daily.   CHADsvasc 3.Continue apixaban 5 mg twice daily  As per cardiology if patient remains compliant with apixaban then they could consider repeat cardioversion as an outpatient.  TSH normal.  Cardiomyopathy  Suspected due to tachycardia from A. fib with RVR and uncontrolled hypertension but cannot exclude ischemic cardiomyopathy.  No history of alcohol abuse.  Plan is to control heart rate and BP adequately and repeat TTE in 3 months and if persistent low LVEF then would need ischemic evaluation.  Continue carvedilol 12.5 mg twice daily.  Losartan changed to Entresto  24/26 twice daily.  Acute systolic CHF  Managing with IV furosemide 40 mg twice daily and spironolactone 12.5 mg daily.  Volume status has improved but may be still mildly volume overloaded as evidenced by ongoing cough and some orthopnea.  Intake output charting does not appear to be accurate.  Poorly controlled DM 2/type II DM  Reports that he quit taking metformin almost 3 years ago due to GI side effects and has not been compliant with any medications, diet or exercise.  A1c 9.9.  Counseled extensively regarding compliance with all aspects of medical care including his diabetes and he verbalized understanding.  Given how high his A1c is, recommend at least a short.  I.e. for a couple of months with insulins to better control and then consider if he can transition to oral medications.  Patient is agreeable.  Currently adjusted Lantus to 12 units at bedtime, NovoLog SSI and added NovoLog mealtime 4 units.  Patient lacks insurance but reports that he can afford the [employer] brand ReliOn 70/30 insulin and may consider transitioning to that later this evening.  RN to provide insulin education.  Discussed with diabetes coordinator who will reassess later today.  Essential hypertension/hypertensive urgency on admission  Mildly uncontrolled at times.  Continue carvedilol, Entresto and Aldactone.  Monitor and adjust medications as needed.  Medical noncompliance  Extensively counseled.  Acute viral URI/sinusitis  Continue supportive care.  Continue Claritin and Flonase nasal spray.  SARS coronavirus testing x2: Negative.  Hypokalemia  Replace and follow.  Magnesium normal.   Overweight/Body mass index is 26.16 kg/m.  Acute kidney injury, mild  Likely related to medications i.e. IV Lasix, ARB, spironolactone.  Follow BMP in a.m.  Suspected OSA  Outpatient follow-up.    DVT prophylaxis: Apixaban Code Status: Full Family Communication: None at bedside Disposition:  DC home pending clinical improvement, hopefully 7/18.   Consultants:  Cardiology  Procedures:  None  Antimicrobials:  None   Subjective: Overall feels better.  Has dry intermittent cough without fevers.  Dyspnea improved but breathing not yet at baseline.  Some orthopnea persists.  No chest pain.  No palpitations.   Objective:  Vitals:   10/18/18 1954 10/18/18 2318 10/19/18 0344 10/19/18 0910  BP: 118/78 131/90 123/83 106/85  Pulse: (!) 38 89 96 91  Resp: (!) 33 20 (!) 23 19  Temp: 98 F (36.7 C) 98.9 F (37.2 C) 98.8 F (37.1 C) 98.4 F (36.9 C)  TempSrc: Oral Oral Oral Oral  SpO2: 96% 99% 98% 96%  Weight:   87.5 kg   Height:        Examination:  General exam: Pleasant young male, moderately built and overweight lying propped up in bed with mild intermittent tachypnea. Respiratory system: Occasional basal crackles but otherwise clear to auscultation without wheezing or rhonchi.  Mild tachypnea at times.  No overt respiratory distress. Cardiovascular system: S1 & S2 heard, irregularly irregular. No JVD, murmurs, rubs, gallops or clicks. No pedal edema.  Telemetry personally reviewed: A. fib with ventricular rate in the 90s-100s. Gastrointestinal system: Abdomen is nondistended, soft and nontender. No organomegaly or masses felt. Normal bowel sounds heard. Central nervous system: Alert and oriented. No focal neurological deficits. Extremities: Symmetric 5 x 5 power. Skin: No rashes, lesions or ulcers Psychiatry: Judgement and insight appear normal. Mood & affect appropriate.     Data Reviewed: I have personally reviewed following labs and imaging studies  CBC: Recent Labs  Lab 10/17/18 0109 10/18/18 0740  WBC 10.9* 10.2  NEUTROABS 7.6  --   HGB 16.6 15.7  HCT 49.2 45.6  MCV 97.0 95.2  PLT 218 222   Basic Metabolic Panel: Recent Labs  Lab 10/17/18 0109 10/17/18 1220 10/18/18 0740 10/19/18 0240  NA 132*  --  136 134*  K 3.3*  --  3.5 3.4*  CL 101   --  101 99  CO2 20*  --  25 27  GLUCOSE 366*  --  238* 244*  BUN 12  --  9 15  CREATININE 0.99  --  1.03 1.24  CALCIUM 8.3*  --  8.4* 8.5*  MG  --  1.7 1.7 1.8   Liver Function Tests: Recent Labs  Lab 10/17/18 0109 10/18/18 0740  AST 45* 26  ALT 59* 44  ALKPHOS 94 79  BILITOT 1.4* 1.8*  PROT 6.6 5.7*  ALBUMIN 3.0* 2.6*   Coagulation Profile: No results for input(s): INR, PROTIME in the last 168 hours. Cardiac Enzymes: No results for input(s): CKTOTAL, CKMB, CKMBINDEX, TROPONINI in the last 168 hours. HbA1C: Recent Labs    10/17/18 1220  HGBA1C 9.9*   CBG: Recent Labs  Lab 10/18/18 0627 10/18/18 1127 10/18/18 1631 10/18/18 2123 10/19/18 0606  GLUCAP 242* 263* 341* 248* 214*    Recent Results (from the past 240 hour(s))  SARS Coronavirus 2 (CEPHEID- Performed in [facility] hospital lab), Hosp Order     Status: None   Collection Time: 10/17/18  2:02 AM   Specimen: Nasopharyngeal Swab  Result Value Ref Range Status   SARS Coronavirus 2 NEGATIVE NEGATIVE Final  Comment: (NOTE) If result is NEGATIVE SARS-CoV-2 target nucleic acids are NOT DETECTED. The SARS-CoV-2 RNA is generally detectable in upper and lower  respiratory specimens during the acute phase of infection. The lowest  concentration of SARS-CoV-2 viral copies this assay can detect is 250  copies / mL. A negative result does not preclude SARS-CoV-2 infection  and should not be used as the sole basis for treatment or other  patient management decisions.  A negative result may occur with  improper specimen collection / handling, submission of specimen other  than nasopharyngeal swab, presence of viral mutation(s) within the  areas targeted by this assay, and inadequate number of viral copies  (<250 copies / mL). A negative result must be combined with clinical  observations, patient history, and epidemiological information. If result is POSITIVE SARS-CoV-2 target nucleic acids are DETECTED. The  SARS-CoV-2 RNA is generally detectable in upper and lower  respiratory specimens dur ing the acute phase of infection.  Positive  results are indicative of active infection with SARS-CoV-2.  Clinical  correlation with patient history and other diagnostic information is  necessary to determine patient infection status.  Positive results do  not rule out bacterial infection or co-infection with other viruses. If result is PRESUMPTIVE POSTIVE SARS-CoV-2 nucleic acids MAY BE PRESENT.   A presumptive positive result was obtained on the submitted specimen  and confirmed on repeat testing.  While 2019 novel coronavirus  (SARS-CoV-2) nucleic acids may be present in the submitted sample  additional confirmatory testing may be necessary for epidemiological  and / or clinical management purposes  to differentiate between  SARS-CoV-2 and other Sarbecovirus currently known to infect humans.  If clinically indicated additional testing with an alternate test  methodology [phone]) is advised. The SARS-CoV-2 RNA is generally  detectable in upper and lower respiratory sp ecimens during the acute  phase of infection. The expected result is Negative. Fact Sheet for Patients:  BoilerBrush.com.cyhttps://www.fda.gov/media/136312/download Fact Sheet for Healthcare Providers: https://pope.com/https://www.fda.gov/media/136313/download This test is not yet approved or cleared by the [place] FDA and has been authorized for detection and/or diagnosis of SARS-CoV-2 by FDA under an Emergency Use Authorization (EUA).  This EUA will remain in effect (meaning this test can be used) for the duration of the COVID-19 declaration under Section 564(b)(1) of the Act, 21 U.S.C. section 360bbb-3(b)(1), unless the authorization is terminated or revoked sooner. Performed at [facility] [place], [address] [place], [place] [address]   MRSA PCR Screening     Status: None   Collection Time: 10/17/18  3:22 PM   Specimen: Nasopharyngeal  Result  Value Ref Range Status   MRSA by PCR NEGATIVE NEGATIVE Final    Comment:        The GeneXpert MRSA Assay (FDA approved for NASAL specimens only), is one component of a comprehensive MRSA colonization surveillance program. It is not intended to diagnose MRSA infection nor to guide or monitor treatment for MRSA infections. Performed at [facility], [address] [place], [place] [address]   SARS Coronavirus 2 (CEPHEID - Performed in [facility] hospital lab), Hosp Order     Status: None   Collection Time: 10/18/18 11:50 AM   Specimen: Nasopharyngeal Swab  Result Value Ref Range Status   SARS Coronavirus 2 NEGATIVE NEGATIVE Final    Comment: (NOTE) If result is NEGATIVE SARS-CoV-2 target nucleic acids are NOT DETECTED. The SARS-CoV-2 RNA is generally detectable in upper and lower  respiratory specimens during the acute  phase of infection. The lowest  concentration of SARS-CoV-2 viral copies this assay can detect is 250  copies / mL. A negative result does not preclude SARS-CoV-2 infection  and should not be used as the sole basis for treatment or other  patient management decisions.  A negative result may occur with  improper specimen collection / handling, submission of specimen other  than nasopharyngeal swab, presence of viral mutation(s) within the  areas targeted by this assay, and inadequate number of viral copies  (<250 copies / mL). A negative result must be combined with clinical  observations, patient history, and epidemiological information. If result is POSITIVE SARS-CoV-2 target nucleic acids are DETECTED. The SARS-CoV-2 RNA is generally detectable in upper and lower  respiratory specimens dur ing the acute phase of infection.  Positive  results are indicative of active infection with SARS-CoV-2.  Clinical  correlation with patient history and other diagnostic information is  necessary to determine patient infection status.  Positive results do  not rule  out bacterial infection or co-infection with other viruses. If result is PRESUMPTIVE POSTIVE SARS-CoV-2 nucleic acids MAY BE PRESENT.   A presumptive positive result was obtained on the submitted specimen  and confirmed on repeat testing.  While 2019 novel coronavirus  (SARS-CoV-2) nucleic acids may be present in the submitted sample  additional confirmatory testing may be necessary for epidemiological  and / or clinical management purposes  to differentiate between  SARS-CoV-2 and other Sarbecovirus currently known to infect humans.  If clinically indicated additional testing with an alternate test  methodology [phone]([id number]) is advised. The SARS-CoV-2 RNA is generally  detectable in upper and lower respiratory sp ecimens during the acute  phase of infection. The expected result is Negative. Fact Sheet for Patients:  BoilerBrush.com.cyhttps://www.fda.gov/media/136312/download Fact Sheet for Healthcare Providers: https://pope.com/https://www.fda.gov/media/136313/download This test is not yet approved or cleared by the [place] FDA and has been authorized for detection and/or diagnosis of SARS-CoV-2 by FDA under an Emergency Use Authorization (EUA).  This EUA will remain in effect (meaning this test can be used) for the duration of the COVID-19 declaration under Section 564(b)(1) of the Act, 21 U.S.C. section 360bbb-3(b)(1), unless the authorization is terminated or revoked sooner. Performed at [facility], [address] [place], [place] [address]          Radiology Studies: No results found.      Scheduled Meds: . apixaban  5 mg Oral BID  . carvedilol  12.5 mg Oral BID WC  . digoxin  0.125 mg Oral Daily  . fluticasone  1 spray Each Nare Daily  . furosemide  40 mg Intravenous BID  . guaiFENesin  600 mg Oral BID  . insulin aspart  0-15 Units Subcutaneous TID WC  . insulin aspart  0-5 Units Subcutaneous QHS  . insulin aspart  4 Units Subcutaneous TID WC  . insulin detemir  12 Units Subcutaneous  QHS  . loratadine  10 mg Oral Daily  . potassium chloride  20 mEq Oral BID  . sacubitril-valsartan  1 tablet Oral BID  . sodium chloride  1 spray Each Nare Q4H  . sodium chloride flush  3 mL Intravenous Q12H  . spironolactone  12.5 mg Oral Daily   Continuous Infusions:   LOS: 1 day     Marcellus ScottAnand Caedin Mogan, MD, FACP, [facility]. [facility]  To contact the attending provider between 7A-7P or the covering provider during after hours 7P-7A, please log into the web site [email or web address] and access using  universal [place] password for that web site. If you do not have the password, please call the hospital operator.  10/19/2018, 11:04 AM

## 2018-10-19 NOTE — Progress Notes (Signed)
Progress Note  Patient Name: David [name] Date of Encounter: 10/19/2018  Primary Cardiologist: Charlton Haws, MD   Subjective   No CP; dyspnea improving  Inpatient Medications    Scheduled Meds: . apixaban  5 mg Oral BID  . carvedilol  12.5 mg Oral BID WC  . fluticasone  1 spray Each Nare Daily  . furosemide  40 mg Intravenous BID  . guaiFENesin  600 mg Oral BID  . insulin aspart  0-15 Units Subcutaneous TID WC  . insulin aspart  0-5 Units Subcutaneous QHS  . insulin aspart  4 Units Subcutaneous TID WC  . insulin detemir  12 Units Subcutaneous QHS  . loratadine  10 mg Oral Daily  . losartan  25 mg Oral Daily  . potassium chloride  20 mEq Oral BID  . sodium chloride  1 spray Each Nare Q4H  . sodium chloride flush  3 mL Intravenous Q12H  . spironolactone  12.5 mg Oral Daily   Continuous Infusions:  PRN Meds: acetaminophen **OR** acetaminophen, albuterol, ondansetron **OR** ondansetron (ZOFRAN) IV   Vital Signs    Vitals:   10/18/18 1633 10/18/18 1954 10/18/18 2318 10/19/18 0344  BP: 117/85 118/78 131/90 123/83  Pulse: (!) 115 (!) 38 89 96  Resp: (!) 29 (!) 33 20 (!) 23  Temp: 98.2 F (36.8 C) 98 F (36.7 C) 98.9 F (37.2 C) 98.8 F (37.1 C)  TempSrc: Oral Oral Oral Oral  SpO2: 97% 96% 99% 98%  Weight:    87.5 kg  Height:        Intake/Output Summary (Last 24 hours) at 10/19/2018 0818 Last data filed at 10/19/2018 0351 Gross per 24 hour  Intake 240 ml  Output 575 ml  Net -335 ml   Last 3 Weights 10/19/2018 10/18/2018 10/17/2018  Weight (lbs) 192 lb 14.4 oz 197 lb 15.6 oz 200 lb  Weight (kg) 87.5 kg 89.8 kg 90.719 kg      Telemetry    Atrial fibrillation rate controlled - Personally Reviewed  Physical Exam   GEN: WD WN Neck: No JVD Cardiac: irregular Respiratory: diffuse rhonchi GI: Soft, NT/ND MS: No edema Neuro:  Grossly intact Psych: Normal affect   Labs     Chemistry Recent Labs  Lab 10/17/18 0109 10/18/18 0740 10/19/18 0240  NA  132* 136 134*  K 3.3* 3.5 3.4*  CL 101 101 99  CO2 20* 25 27  GLUCOSE 366* 238* 244*  BUN 12 9 15   CREATININE 0.99 1.03 1.24  CALCIUM 8.3* 8.4* 8.5*  PROT 6.6 5.7*  --   ALBUMIN 3.0* 2.6*  --   AST 45* 26  --   ALT 59* 44  --   ALKPHOS 94 79  --   BILITOT 1.4* 1.8*  --   GFRNONAA >60 >60 >60  GFRAA >60 >60 >60  ANIONGAP 11 10 8      Hematology Recent Labs  Lab 10/17/18 0109 10/18/18 0740  WBC 10.9* 10.2  RBC 5.07 4.79  HGB 16.6 15.7  HCT 49.2 45.6  MCV 97.0 95.2  MCH 32.7 32.8  MCHC 33.7 34.4  RDW 12.5 12.6  PLT 218 222    BNP Recent Labs  Lab 10/17/18 0109  BNP 419.1*     Radiology    No results found.  Patient Profile     58 year old male with past medical history of diabetes mellitus, persistent atrial fibrillation, mild cardiomyopathy, obstructive sleep apnea and noncompliance admitted with CHF symptoms, URI and atrial fibrillation.  Cardiology  now asked to evaluate.  Patient has taken no medications in the past 5 to 6 months.  Echocardiogram this admission shows ejection fraction 35 to 40%.   Assessment & Plan    1 persistent atrial fibrillation-patient failed cardioversion in the past (> 1 year ago) and was placed on sotalol with plans for repeat attempt at cardioversion.  However this did not occur and he stopped all meds 5 months ago.  Attempts at repeat cardioversion will likely be unsuccessful given duration of atrial fibrillation.  For now we will plan rate control.  Continue carvedilol 12.5 mg twice daily.  Add digoxin 0.125 mg daily. CHADsvasc 3.  Continue apixaban 5 mg twice daily.  If patient is compliant with apixaban we could consider attempt at repeat cardioversion as an outpatient.  Note TSH is normal.  2 cardiomyopathy-possibly secondary to uncontrolled hypertension or tachycardia.  Cannot exclude coronary disease or viral CM.  No history of alcohol abuse.  Plan will be to control heart rate and blood pressure and then repeat echocardiogram in  3 months.  If LV function remains decreased would need ischemia evaluation at that time.  Continue carvedilol both for cardiomyopathy and rate control of atrial fibrillation.  change losartan to entresto 24/26 BID.  3 acute systolic congestive heart failure-continue diuresis and follow renal function.   4 noncompliance-patient needs care management consult to assist with medications.  We discussed the importance of being compliant with medications and follow-up.  5 diabetes mellitus-hemoglobin A1c 9.9.  Further management per primary care.  6 hypertension-medications as outlined above.  Follow blood pressure and advance as needed.  7 URI-fu coronavirus neg; per IM  For questions or updates, please contact [place] Please consult [email or web address] for contact info under        Signed, Kirk Ruths, MD  10/19/2018, 8:18 AM

## 2018-10-19 NOTE — Progress Notes (Signed)
7/17 Tomi Bamberger RN, BSN- NCM spoke with [facility] , they fille eliquis free for patient , NCM went to pick up for patient  And gave to [facility] RN .  Also [facility] filled the rest of his medications for him. He has a follow up apt with [facility].

## 2018-10-20 DIAGNOSIS — I5021 Acute systolic (congestive) heart failure: Secondary | ICD-10-CM

## 2018-10-20 LAB — BASIC METABOLIC PANEL
Anion gap: 10 (ref 5–15)
BUN: 15 mg/dL (ref 6–20)
CO2: 28 mmol/L (ref 22–32)
Calcium: 9.2 mg/dL (ref 8.9–10.3)
Chloride: 98 mmol/L (ref 98–111)
Creatinine, Ser: 1.1 mg/dL (ref 0.61–1.24)
GFR calc Af Amer: >60 mL/min (ref >60)
GFR calc non Af Amer: >60 mL/min (ref >60)
Glucose, Bld: 215 mg/dL — ABNORMAL HIGH (ref 70–99)
Potassium: 4.2 mmol/L (ref 3.5–5.1)
Sodium: 136 mmol/L (ref 135–145)

## 2018-10-20 LAB — GLUCOSE, CAPILLARY: Glucose-Capillary: 178 mg/dL — ABNORMAL HIGH (ref 70–99)

## 2018-10-20 MED ORDER — FUROSEMIDE 40 MG PO TABS: 40.0000 mg | ORAL_TABLET | Freq: Two times a day (BID) | ORAL | Status: DC

## 2018-10-20 MED ORDER — GUAIFENESIN-DM 100-10 MG/5ML PO SYRP: 5.0000 mL | ORAL_SOLUTION | ORAL | 0 refills | Status: DC | PRN

## 2018-10-20 NOTE — Progress Notes (Signed)
Progress Note  Patient Name: David [name] Date of Encounter: 10/20/2018  Primary Cardiologist: Jenkins Rouge, MD   Subjective   Denies CP or dyspnea; continued cough but improved  Inpatient Medications    Scheduled Meds: . apixaban  5 mg Oral BID  . carvedilol  12.5 mg Oral BID WC  . digoxin  0.125 mg Oral Daily  . fluticasone  1 spray Each Nare Daily  . furosemide  40 mg Intravenous BID  . guaiFENesin  600 mg Oral BID  . insulin aspart  0-15 Units Subcutaneous TID WC  . insulin aspart  0-5 Units Subcutaneous QHS  . insulin aspart  4 Units Subcutaneous TID WC  . insulin detemir  14 Units Subcutaneous QHS  . loratadine  10 mg Oral Daily  . potassium chloride  20 mEq Oral BID  . sacubitril-valsartan  1 tablet Oral BID  . sodium chloride  1 spray Each Nare Q4H  . sodium chloride flush  3 mL Intravenous Q12H  . spironolactone  12.5 mg Oral Daily   Continuous Infusions:  PRN Meds: acetaminophen **OR** acetaminophen, albuterol, ondansetron **OR** ondansetron (ZOFRAN) IV   Vital Signs    Vitals:   10/20/18 0000 10/20/18 0500 10/20/18 0654 10/20/18 0700  BP:  98/81 104/86 104/86  Pulse: 70 (!) 51 100   Resp: (!) 28 10    Temp:  98 F (36.7 C)  98 F (36.7 C)  TempSrc:  Oral  Oral  SpO2: 96% 96%    Weight:  87.5 kg    Height:        Intake/Output Summary (Last 24 hours) at 10/20/2018 0831 Last data filed at 10/20/2018 0727 Gross per 24 hour  Intake 960 ml  Output -  Net 960 ml   Last 3 Weights 10/20/2018 10/19/2018 10/18/2018  Weight (lbs) 192 lb 14.4 oz 192 lb 14.4 oz 197 lb 15.6 oz  Weight (kg) 87.5 kg 87.5 kg 89.8 kg      Telemetry    Atrial fibrillation rate controlled - Personally Reviewed  Physical Exam   GEN: WD WN NAD Neck: supple Cardiac: irregular, no murmur Respiratory: CTA GI: Soft, NT/ND, no masses MS: No edema Neuro:  No focal findings Psych: Normal affect   Labs     Chemistry Recent Labs  Lab 10/17/18 0109 10/18/18 0740 10/19/18  0240  NA 132* 136 134*  K 3.3* 3.5 3.4*  CL 101 101 99  CO2 20* 25 27  GLUCOSE 366* 238* 244*  BUN 12 9 15   CREATININE 0.99 1.03 1.24  CALCIUM 8.3* 8.4* 8.5*  PROT 6.6 5.7*  --   ALBUMIN 3.0* 2.6*  --   AST 45* 26  --   ALT 59* 44  --   ALKPHOS 94 79  --   BILITOT 1.4* 1.8*  --   GFRNONAA >60 >60 >60  GFRAA >60 >60 >60  ANIONGAP 11 10 8      Hematology Recent Labs  Lab 10/17/18 0109 10/18/18 0740  WBC 10.9* 10.2  RBC 5.07 4.79  HGB 16.6 15.7  HCT 49.2 45.6  MCV 97.0 95.2  MCH 32.7 32.8  MCHC 33.7 34.4  RDW 12.5 12.6  PLT 218 222    BNP Recent Labs  Lab 10/17/18 0109  BNP 419.1*     Patient Profile     58 year old male with past medical history of diabetes mellitus, persistent atrial fibrillation, mild cardiomyopathy, obstructive sleep apnea and noncompliance admitted with CHF symptoms, URI and atrial fibrillation.  Cardiology  now asked to evaluate.  Patient has taken no medications in the past 5 to 6 months.  Echocardiogram this admission shows ejection fraction 35 to 40%.   Assessment & Plan    1 persistent atrial fibrillation-patient failed cardioversion in the past (> 1 year ago) and was placed on sotalol with plans for repeat attempt at cardioversion.  However this did not occur and he stopped all meds 5 months ago.  Attempts at repeat cardioversion will likely be unsuccessful given duration of atrial fibrillation.  For now we will plan rate control.  Continue carvedilol 12.5 mg twice daily and digoxin 0.125 mg daily. CHADsvasc 3.  Continue apixaban 5 mg twice daily.  If patient is compliant with apixaban we could consider attempt at repeat cardioversion as an outpatient.  Note TSH is normal.  2 cardiomyopathy-possibly secondary to uncontrolled hypertension or tachycardia.  Cannot exclude coronary disease or viral CM.  No history of alcohol abuse.  Plan will be to control heart rate and blood pressure and then repeat echocardiogram in 3 months.  If LV function  remains decreased would need ischemia evaluation at that time.  Continue carvedilol both for cardiomyopathy and rate control of atrial fibrillation.  Continue entresto 24/26 BID.  3 acute systolic congestive heart failure-change lasix to 40 mg po BID and spironolactone 12.5 mg daily.   4 noncompliance-patient needs care management consult to assist with medications.  We discussed the importance of being compliant with medications and follow-up.  5 diabetes mellitus-hemoglobin A1c 9.9.  Further management per primary care.  6 hypertension-medications as outlined above.  Follow blood pressure and advance as needed.  7 URI-fu coronavirus neg; per IM  Patient can be discharged from a cardiac standpoint on present medications.  Would arrange transition of care appointment with APP in 1 week.  Check potassium and renal function at that time.  Follow-up Dr. Eden Emms 3 months.  Please call with questions.  For questions or updates, please contact [facility] Please consult [email or web address] for contact info under        Signed, Olga Millers, MD  10/20/2018, 8:31 AM

## 2018-10-20 NOTE — Discharge Summary (Signed)
Physician Discharge Summary  David [name] GEX:[id number] DOB: 04/27/1960  PCP: Patient, No Pcp Per  Admit date: 10/17/2018 Discharge date: 10/20/2018  Recommendations for Outpatient Follow-up:  1. Dr. Jenkins Rouge, Cardiology: MDs office will arrange follow-up appointment with Dr. Kyla Balzarine NP/PA in 1 week with repeat labs (BMP). 2. Geryl Rankins, NP/new PCP at the [facility] on 11/02/2018 at 9:30 AM.  To be seen with repeat labs (CBC & CMP).  Please address ongoing medication needs and Diabetes management during this visit.  Home Health: None Equipment/Devices: None  Discharge Condition: Improved and stable CODE STATUS: Full Diet recommendation: Heart healthy & diabetic diet.  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 fib, mild cardiomyopathy, suspected OSA admitted for A. fib with RVR, acute systolic CHF and poorly controlled DM 2.    Cardiology was consulted.   Assessment & Plan:  Persistent atrial fibrillation admitted with RVR  Cardiology consulted and their input appreciated.  Failed cardioversion >1-year ago, was placed on Sotalol with plans for repeat attempted cardioversion but that did not happen and he stopped all his medication several months ago.  Briefly on Cardizem drip on admission, now off.  Continue rate control with Carvedilol 12.5 mg twice daily.  Added digoxin 0.125 mg daily.  Rate reasonably controlled.  CHADsvasc 3.Continue Apixaban 5 mg twice daily  As per cardiology if  patient remains compliant with Apixaban then they could consider repeat cardioversion as an outpatient.  TSH normal.  Cardiomyopathy  Suspected due to tachycardia from A. fib with RVR and uncontrolled hypertension but cannot exclude ischemic cardiomyopathy.  No history of alcohol abuse.  Plan is to control heart rate and BP adequately and repeat TTE in 3 months and if persistent low LVEF then would need ischemic evaluation.  Continue Carvedilol 12.5 mg twice daily.  Losartan changed to Entresto 24/26 twice daily.  Acute systolic CHF  Managed with IV furosemide 40 mg twice daily and spironolactone 12.5 mg daily.  Clinically appears euvolemic today.  Cardiology has seen him and cleared him for discharge with close outpatient follow-up with them.  I discussed with Dr. Stanford Breed, since patient discharging on Lasix 40 mg twice daily, recommends continuing KCl 20 M EQ daily along with spironolactone and Entresto.  Poorly controlled DM 2/type II DM  Reports that he quit taking metformin almost 3 years ago due to GI side effects and has not been compliant with any medications, diet or exercise.  A1c 9.9.  Counseled repeatedly and extensively regarding compliance with all aspects of medical care including his diabetes and he verbalized understanding.  Given how high his A1c is, recommend at least a short.  I.e. for a couple of months with insulins to better control and then consider if he can transition to oral medications.  Patient is agreeable.  Currently adjusted Levemir to 14 units at bedtime and added NovoLog mealtime 4 units.  Improved control.  Still mildly uncontrolled and fluctuating.  As discussed extensively with patient and DM coordinator, patient will discharge home on this regimen, maintain home  CBG log, close outpatient follow-up with PCP and adjust insulin regimen as needed and may consider adding SSI.  Patient has also been counseled that he should not take NovoLog if he does  not eat adequately and he verbalized understanding.  Diabetes coordinator has met with patient and counseled him extensively.  All his medications have been procured for him at no charge to him prior to discharge by case management.  Essential hypertension/hypertensive urgency on admission  Controlled  Continue Carvedilol, Entresto and Aldactone.  Monitor and adjust medications as needed.  Medical noncompliance  Extensively counseled.  Acute viral URI/sinusitis  Continue supportive care.    SARS coronavirus testing x2: Negative.  Still has mild dry intermittent coughing but significantly improved compared to admission.  Advised to take Robitussin-DM as needed.  Hypokalemia  Replaced.  Magnesium normal.  Follow BMP closely as outpatient in about a week's time.  Overweight/Body mass index is 26.16 kg/m.   Acute kidney injury, mild  Likely related to medications i.e. IV Lasix, ARB, spironolactone.  Resolved.  Suspected OSA  Outpatient follow-up.  LFT abnormalities Mild.  Unclear etiology.?  Related to passive congestion from CHF versus fatty liver.  Transaminitis has resolved.  Still has mild isolated hyperbilirubinemia.  No GI symptoms.  May consider repeating labs outpatient and follow-up as deemed necessary.  Hyperlipidemia  LDL 161 in the context of very poorly controlled DM 2.  Consider repeating fasting lipids after DM is better controlled and then initiation of statins if needed.    Consultants:  Cardiology  Procedures:  None    Discharge Instructions  Discharge Instructions    (HEART FAILURE PATIENTS) Call MD:  Anytime you have any of the following symptoms: 1) 3 pound weight gain in 24 hours or 5 pounds in 1 week 2) shortness of breath, with or without a dry hacking cough 3) swelling in the hands, feet or stomach 4) if you have to sleep on extra pillows at night in order to breathe.   Complete by: As directed    Ambulatory referral to  Nutrition and Diabetic Education   Complete by: As directed    Call MD for:  difficulty breathing, headache or visual disturbances   Complete by: As directed    Call MD for:  extreme fatigue   Complete by: As directed    Call MD for:  persistant dizziness or light-headedness   Complete by: As directed    Call MD for:  persistant nausea and vomiting   Complete by: As directed    Call MD for:  severe uncontrolled pain   Complete by: As directed    Call MD for:  temperature >100.4   Complete by: As directed    Diet - low sodium heart healthy   Complete by: As directed    Diet Carb Modified   Complete by: As directed    Increase activity slowly   Complete by: As directed        Medication List    STOP taking these medications   acetaminophen 325 MG tablet Commonly known as: TYLENOL   diltiazem 120 MG 24 hr tablet Commonly known as: CARDIZEM LA   losartan 25 MG tablet Commonly known as: COZAAR   metFORMIN 500 MG tablet Commonly known as: GLUCOPHAGE   sotalol 80 MG tablet Commonly known as: BETAPACE     TAKE these medications   apixaban 5 MG Tabs tablet Commonly known as: ELIQUIS Take 1 tablet (5 mg total) by mouth 2 (two) times daily.  carvedilol 12.5 MG tablet Commonly known as: COREG Take 1 tablet (12.5 mg total) by mouth 2 (two) times daily with a meal.   digoxin 0.125 MG tablet Commonly known as: LANOXIN Take 1 tablet (0.125 mg total) by mouth daily.   furosemide 40 MG tablet Commonly known as: Lasix Take 1 tablet (40 mg total) by mouth 2 (two) times daily.   guaiFENesin-dextromethorphan 100-10 MG/5ML syrup Commonly known as: ROBITUSSIN DM Take 5 mLs by mouth every 4 (four) hours as needed for cough.   insulin aspart 100 UNIT/ML FlexPen Commonly known as: NOVOLOG Inject 4 Units into the skin 3 (three) times daily with meals.   Insulin Detemir 100 UNIT/ML Pen Commonly known as: LEVEMIR Inject 14 Units into the skin at bedtime.   Insulin Pen Needle  32G X 4 MM Misc Use as directed 3 times a day with meals and at bedtime.   potassium chloride SA 20 MEQ tablet Commonly known as: K-DUR Take 1 tablet (20 mEq total) by mouth daily.   sacubitril-valsartan 24-26 MG Commonly known as: ENTRESTO Take 1 tablet by mouth 2 (two) times daily.   spironolactone 25 MG tablet Commonly known as: ALDACTONE Take 0.5 tablets (12.5 mg total) by mouth daily.      Follow-up [facility] on 11/02/2018.   Why: 9:30 for hospital follow up. To be seen with repeat labs (CBC & BMP).  Please address medication needs and DM management during this visit. Contact information: [place] [address] [phone]       Josue Hector, MD Follow up in 1 week(s).   Specialty: Cardiology Why: We will arrange for a f/u appt with Dr. Kyla Balzarine NP/PA and contact you. Contact information: [address] [place] [address] [phone]          No Known Allergies    Procedures/Studies: Dg Chest Port 1 View  Result Date: 10/17/2018 CLINICAL DATA:  58 year old male with shortness of breath for 2 weeks. EXAM: PORTABLE CHEST 1 VIEW COMPARISON:  Portable chest 09/26/2017 and earlier. FINDINGS: Portable AP upright view at 0104 hours. Stable cardiomegaly and mediastinal contours. Stable lung volumes. Questionable increased pulmonary vascularity, but no overt edema. Otherwise allowing for portable technique the lungs are clear. Visualized tracheal air column is within normal limits. No pneumothorax. No acute osseous abnormality identified. IMPRESSION: Stable cardiomegaly. Increased pulmonary vascularity but no overt edema. Electronically Signed   By: Genevie Ann M.D.   On: 10/17/2018 01:38      Subjective: Overall feels much better compared to admission.  Ready to go home.  Still has mild intermittent dry cough but significantly improved compared to admission.   No chest pain, palpitations, dyspnea, orthopnea, dizziness or lightheadedness.  Reports that he has been fully educated regarding insulin management.  Denies any other complaints.  States that he has already communicated with his employer regarding assistance with health insurance for medication needs.  Discharge Exam:  Vitals:   10/20/18 0000 10/20/18 0500 10/20/18 0654 10/20/18 0700  BP:  98/81 104/86 104/86  Pulse: 70 (!) 51 100 64  Resp: (!) 28 10    Temp:  98 F (36.7 C)  98 F (36.7 C)  TempSrc:  Oral  Oral  SpO2: 96% 96%  97%  Weight:  87.5 kg    Height:        General exam: Pleasant young male, moderately built and overweight lying comfortably supine  in bed without distress. Respiratory system:  Clear to auscultation.  No increased work of breathing. Cardiovascular system: S1 & S2 heard, irregularly irregular. No JVD, murmurs, rubs, gallops or clicks. No pedal edema.   Telemetry personally reviewed: A. fib with mostly controlled ventricular rate in the 90s. Gastrointestinal system: Abdomen is nondistended, soft and nontender. No organomegaly or masses felt. Normal bowel sounds heard. Central nervous system: Alert and oriented. No focal neurological deficits. Extremities: Symmetric 5 x 5 power. Skin: No rashes, lesions or ulcers Psychiatry: Judgement and insight appear normal. Mood & affect appropriate.     The results of significant diagnostics from this hospitalization (including imaging, microbiology, ancillary and laboratory) are listed below for reference.     Microbiology: Recent Results (from the past 240 hour(s))  SARS Coronavirus 2 (CEPHEID- Performed in [facility] hospital lab), Hosp Order     Status: None   Collection Time: 10/17/18  2:02 AM   Specimen: Nasopharyngeal Swab  Result Value Ref Range Status   SARS Coronavirus 2 NEGATIVE NEGATIVE Final    Comment: (NOTE) If result is NEGATIVE SARS-CoV-2 target nucleic acids are NOT DETECTED. The SARS-CoV-2 RNA  is generally detectable in upper and lower  respiratory specimens during the acute phase of infection. The lowest  concentration of SARS-CoV-2 viral copies this assay can detect is 250  copies / mL. A negative result does not preclude SARS-CoV-2 infection  and should not be used as the sole basis for treatment or other  patient management decisions.  A negative result may occur with  improper specimen collection / handling, submission of specimen other  than nasopharyngeal swab, presence of viral mutation(s) within the  areas targeted by this assay, and inadequate number of viral copies  (<250 copies / mL). A negative result must be combined with clinical  observations, patient history, and epidemiological information. If result is POSITIVE SARS-CoV-2 target nucleic acids are DETECTED. The SARS-CoV-2 RNA is generally detectable in upper and lower  respiratory specimens dur ing the acute phase of infection.  Positive  results are indicative of active infection with SARS-CoV-2.  Clinical  correlation with patient history and other diagnostic information is  necessary to determine patient infection status.  Positive results do  not rule out bacterial infection or co-infection with other viruses. If result is PRESUMPTIVE POSTIVE SARS-CoV-2 nucleic acids MAY BE PRESENT.   A presumptive positive result was obtained on the submitted specimen  and confirmed on repeat testing.  While 2019 novel coronavirus  (SARS-CoV-2) nucleic acids may be present in the submitted sample  additional confirmatory testing may be necessary for epidemiological  and / or clinical management purposes  to differentiate between  SARS-CoV-2 and other Sarbecovirus currently known to infect humans.  If clinically indicated additional testing with an alternate test  methodology [phone]) is advised. The SARS-CoV-2 RNA is generally  detectable in upper and lower respiratory sp ecimens during the acute  phase of  infection. The expected result is Negative. Fact Sheet for Patients:  StrictlyIdeas.no Fact Sheet for Healthcare Providers: BankingDealers.co.za This test is not yet approved or cleared by the [place] FDA and has been authorized for detection and/or diagnosis of SARS-CoV-2 by FDA under an Emergency Use Authorization (EUA).  This EUA will remain in effect (meaning this test can be used) for the duration of the COVID-19 declaration under Section 564(b)(1) of the Act, 21 U.S.C. section 360bbb-3(b)(1), unless the authorization is terminated or revoked sooner. Performed at [facility], [address] [place], [place] [address]   MRSA PCR Screening     Status: None   Collection Time: 10/17/18  3:22 PM   Specimen: Nasopharyngeal  Result Value Ref Range Status   MRSA by PCR NEGATIVE NEGATIVE Final    Comment:        The GeneXpert MRSA Assay (FDA approved for NASAL specimens only), is one component of a comprehensive MRSA colonization surveillance program. It is not intended to diagnose MRSA infection nor to guide or monitor treatment for MRSA infections. Performed at [facility], [place] [address] [place], [place] [address]   SARS Coronavirus 2 (CEPHEID - Performed in [place] hospital lab), Hosp Order     Status: None   Collection Time: 10/18/18 11:50 AM   Specimen: Nasopharyngeal Swab  Result Value Ref Range Status   SARS Coronavirus 2 NEGATIVE NEGATIVE Final    Comment: (NOTE) If result is NEGATIVE SARS-CoV-2 target nucleic acids are NOT DETECTED. The SARS-CoV-2 RNA is generally detectable in upper and lower  respiratory specimens during the acute phase of infection. The lowest  concentration of SARS-CoV-2 viral copies this assay can detect is 250  copies / mL. A negative result does not preclude SARS-CoV-2 infection  and should not be used as the sole basis for treatment or other  patient management  decisions.  A negative result may occur with  improper specimen collection / handling, submission of specimen other  than nasopharyngeal swab, presence of viral mutation(s) within the  areas targeted by this assay, and inadequate number of viral copies  (<250 copies / mL). A negative result must be combined with clinical  observations, patient history, and epidemiological information. If result is POSITIVE SARS-CoV-2 target nucleic acids are DETECTED. The SARS-CoV-2 RNA is generally detectable in upper and lower  respiratory specimens dur ing the acute phase of infection.  Positive  results are indicative of active infection with SARS-CoV-2.  Clinical  correlation with patient history and other diagnostic information is  necessary to determine patient infection status.  Positive results do  not rule out bacterial infection or co-infection with other viruses. If result is PRESUMPTIVE POSTIVE SARS-CoV-2 nucleic acids MAY BE PRESENT.   A presumptive positive result was obtained on the submitted specimen  and confirmed on repeat testing.  While 2019 novel coronavirus  (SARS-CoV-2) nucleic acids may be present in the submitted sample  additional confirmatory testing may be necessary for epidemiological  and / or clinical management purposes  to differentiate between  SARS-CoV-2 and other Sarbecovirus currently known to infect humans.  If clinically indicated additional testing with an alternate test  methodology [phone]) is advised. The SARS-CoV-2 RNA is generally  detectable in upper and lower respiratory sp ecimens during the acute  phase of infection. The expected result is Negative. Fact Sheet for Patients:  StrictlyIdeas.no Fact Sheet for Healthcare Providers: BankingDealers.co.za This test is not yet approved or cleared by the [place] FDA and has been authorized for detection and/or diagnosis of SARS-CoV-2 by FDA under an  Emergency Use Authorization (EUA).  This EUA will remain in effect (meaning this test can be used) for the duration of the COVID-19 declaration under Section 564(b)(1) of the Act, 21 U.S.C. section 360bbb-3(b)(1), unless the authorization is terminated or revoked sooner. Performed at [facility], [place] [address], [place], [place] [address]      Labs: CBC: Recent Labs  Lab 10/17/18 0109 10/18/18 0740  WBC 10.9* 10.2  NEUTROABS 7.6  --  HGB 16.6 15.7  HCT 49.2 45.6  MCV 97.0 95.2  PLT 218 196   Basic Metabolic Panel: Recent Labs  Lab 10/17/18 0109 10/17/18 1220 10/18/18 0740 10/19/18 0240 10/20/18 0718  NA 132*  --  136 134* 136  K 3.3*  --  3.5 3.4* 4.2  CL 101  --  101 99 98  CO2 20*  --  _0 GLUCOSE 366*  --  238* 244* 215*  BUN 12  --  _1 CREATININE 0.99  --  1.03 1.24 1.10  CALCIUM 8.3*  --  8.4* 8.5* 9.2  MG  --  1.7 1.7 1.8  --    Liver Function Tests: Recent Labs  Lab 10/17/18 0109 10/18/18 0740  AST 45* 26  ALT 59* 44  ALKPHOS 94 79  BILITOT 1.4* 1.8*  PROT 6.6 5.7*  ALBUMIN 3.0* 2.6*   BNP (last 3 results) Recent Labs    10/17/18 0109  BNP 419.1*   CBG: Recent Labs  Lab 10/19/18 0606 10/19/18 1126 10/19/18 1616 10/19/18 2059 10/20/18 0605  GLUCAP 214* 239* 119* 196* 178*   Hgb A1c Recent Labs    10/17/18 1220  HGBA1C 9.9*   Lipid Profile Recent Labs    10/18/18 0740  CHOL 215*  HDL 33*  LDLCALC 161*  TRIG 103  CHOLHDL 6.5   Thyroid function studies Recent Labs    10/17/18 1220  TSH 2.006     Time coordinating discharge: 40 minutes  SIGNED:  Vernell Leep, MD, FACP, [facility]. [facility]  To contact the attending provider between 7A-7P or the covering provider during after hours 7P-7A, please log into the web site [email or web address] and access using universal [place] password for that web site. If you do not have the password, please call the hospital operator.

## 2018-10-29 ENCOUNTER — Telehealth: Payer: Self-pay | Admitting: Nurse Practitioner

## 2018-10-29 NOTE — Telephone Encounter (Signed)
New Message         COVID-19 Pre-Screening Questions:   In the past 7 to 10 days have you had a cough,  shortness of breath, headache, congestion, fever (100 or greater) body aches, chills, sore throat, or sudden loss of taste or sense of smell?  Persistent Cough, nasal congestion, SOB that he says is getting better   Have you been around anyone with known Covid 19. NO  Have you been around anyone who is awaiting Covid 19 test results in the past 7 to 10 days? Pt was tested twice and both were NEG   Have you been around anyone who has been exposed to Covid 19, or has mentioned symptoms of Covid 19 within the past 7 to 10 days? NO  If you have any concerns/questions about symptoms patients report during screening (either on the phone or at threshold). Contact the provider seeing the patient or DOD for further guidance.  If neither are available contact a member of the leadership team.

## 2018-10-29 NOTE — Telephone Encounter (Signed)
Noted.   Will see in the office - needs EKG.  Will need to wear a mask and have screening temperature.   Burtis Junes, RN, [place] [address] [place], [place]  [address] [phone]

## 2018-10-29 NOTE — Telephone Encounter (Signed)
S/w pt is aware to come to appt tomorrow unless pt develops a fever of 100 or greater.

## 2018-10-29 NOTE — Progress Notes (Signed)
CARDIOLOGY OFFICE NOTE  Date:  10/30/2018    David [name] Date of Birth: 1960-10-12 Medical Record #[id number]  PCP:  Patient, No Pcp Per  Cardiologist:  David [name]    Chief Complaint  Patient presents with  . Follow-up    Seen for Dr. Johnsie [name]    History of Present Illness: David [name] is a 58 y.o. male who presents today for a post hospital visit. Seen for Dr. Johnsie [name].   He is divorced, lives with a roommate and is a Freight forwarder of a Arts administrator.   He has a history of poorly controlled DM 2 due to noncompliance, persistent A. fib, mild cardiomyopathy, & suspected OSA. He was recently admitted with AF with RVR with acute on chronic systolic HF.  Noted that the patient failed cardioversion over a year ago (09/2017) - had been placed on Sotalol with plans for repeat cardioversion but this did not happen - he then stopped all of his medicines. EF is now down. He was treated with CCB and digoxin was required for rate control. CHADSVASC of at least 3. He has been placed on Eliquis. If he can demonstrate compliance, then repeat cardioversion could be entertained. Losartan was changed to [facility]. There was concern that tachycardia was the etiology for his cardiomyopathy. Insulin was initiated.   The patient does not have symptoms concerning for COVID-19 infection (fever, chills, cough, or new shortness of breath).   Comes in today. Here alone. He is doing well. He has lots of concerns. Failed to follow back up last year due to financial issues - he had lost his job/laid off - lost his insurance. Really did not pay attention to his health until now. Now realizes it needs to be a priority and seems motivated to make the changes needed. He has his medicines - he was not given any refills. He is going to go to [facility]. He has been on ARB in the past - may not be able to afford Entresto or Eliquis going forward and for long term usage. He feels good. He is not short of breath. Not dizzy. No  palpitations. Not swelling. Cough improving - typically just has at night when lying down.  Actively losing weight intentionally now. No real alcohol use noted in the past. 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   Past Medical History:  Diagnosis Date  . Cardiomyopathy ([place])    a. EF 45% in 2019.  Marland Kitchen Chronic anticoagulation 09/30/2017  . Diabetes mellitus type 2 in nonobese (HCC)   . Does not have health insurance   . Essential hypertension 09/26/2017  . Financial difficulties   . H/O noncompliance with medical treatment, presenting hazards to health   . Non-insulin treated type 2 diabetes mellitus ([place]) 09/26/2017  . Persistent atrial fibrillation   . Sleep apnea suspected 09/30/2017    Past Surgical History:  Procedure Laterality Date  . APPENDECTOMY  1971  . CARDIOVERSION N/A 09/28/2017   Procedure: CARDIOVERSION;  Surgeon: Sanda Klein, MD;  Location: [facility];  Service: Cardiovascular;  Laterality: N/A;  . TEE WITHOUT CARDIOVERSION N/A 09/28/2017   Procedure: TRANSESOPHAGEAL ECHOCARDIOGRAM (TEE);  Surgeon: Sanda Klein, MD;  Location: [facility] ENDOSCOPY;  Service: Cardiovascular;  Laterality: N/A;     Medications: Current Meds  Medication Sig  . apixaban (ELIQUIS) 5 MG TABS tablet Take 1 tablet (5 mg total) by mouth 2 (two) times daily.  Marland Kitchen  digoxin (LANOXIN) 0.125 MG tablet Take 1 tablet (0.125 mg total) by mouth daily.  . furosemide (LASIX) 40 MG tablet Take 1 tablet (40 mg total) by mouth daily.  Marland Kitchen. guaiFENesin-dextromethorphan (ROBITUSSIN DM) 100-10 MG/5ML syrup Take 5 mLs by mouth every 4 (four) hours as needed for cough.  . insulin aspart (NOVOLOG) 100 UNIT/ML FlexPen Inject 4 Units into the skin 3 (three) times daily with meals.  . Insulin Detemir (LEVEMIR) 100 UNIT/ML Pen Inject 14 Units into the skin at bedtime.  . Insulin Pen Needle 32G X 4 MM MISC Use as directed 3 times a day with meals and at bedtime.  .  potassium chloride SA (K-DUR) 20 MEQ tablet Take 1 tablet (20 mEq total) by mouth daily.  . sacubitril-valsartan (ENTRESTO) 24-26 MG Take 1 tablet by mouth 2 (two) times daily.  Marland Kitchen. spironolactone (ALDACTONE) 25 MG tablet Take 0.5 tablets (12.5 mg total) by mouth daily.  . [DISCONTINUED] carvedilol (COREG) 12.5 MG tablet Take 1 tablet (12.5 mg total) by mouth 2 (two) times daily with a meal.  . [DISCONTINUED] digoxin (LANOXIN) 0.125 MG tablet Take 1 tablet (0.125 mg total) by mouth daily.  . [DISCONTINUED] furosemide (LASIX) 40 MG tablet Take 1 tablet (40 mg total) by mouth 2 (two) times daily.  . [DISCONTINUED] sacubitril-valsartan (ENTRESTO) 24-26 MG Take 1 tablet by mouth 2 (two) times daily.  . [DISCONTINUED] spironolactone (ALDACTONE) 25 MG tablet Take 0.5 tablets (12.5 mg total) by mouth daily.     Allergies: No Known Allergies  Social History: The patient  reports that he quit smoking about 7 months ago. His smoking use included cigarettes. He quit after 15.00 years of use. He has never used smokeless tobacco. He reports current alcohol use of about 3.0 standard drinks of alcohol per week. He reports that he does not use drugs.   Family History: The patient's family history includes Hypertension in his mother.   Review of Systems: Please see the history of present illness.   All other systems are reviewed and negative.   Physical Exam: VS:  BP 112/70   Pulse 90   Ht 6' (1.829 m)   Wt 189 lb (85.7 kg)   SpO2 98%   BMI 25.63 kg/m  .  BMI Body mass index is 25.63 kg/m.  Wt Readings from Last 3 Encounters:  10/30/18 189 lb (85.7 kg)  10/20/18 192 lb 14.4 oz (87.5 kg)  09/30/17 190 lb 14.4 oz (86.6 kg)    General: Pleasant. Well developed, well nourished and in no acute distress.   HEENT: Normal.  Neck: Supple, no JVD, carotid bruits, or masses noted.  Cardiac: Irregular irregular rhythm. Rate is fair. No murmurs, rubs, or gallops. No edema.  Respiratory:  Lungs are clear  to auscultation bilaterally with normal work of breathing.  GI: Soft and nontender.  MS: No deformity or atrophy. Gait and ROM intact.  Skin: Warm and dry. Color is normal.  Neuro:  Strength and sensation are intact and no gross focal deficits noted.  Psych: Alert, appropriate and with normal affect.   LABORATORY DATA:  EKG:  EKG is ordered today. This demonstrates AF with diffuse ST & T wave changes - HR is 91.  Lab Results  Component Value Date   WBC 10.2 10/18/2018   HGB 15.7 10/18/2018   HCT 45.6 10/18/2018   PLT 222 10/18/2018   GLUCOSE 215 (H) 10/20/2018   CHOL 215 (H) 10/18/2018   TRIG 103 10/18/2018   HDL 33 (L) 10/18/2018  LDLCALC 161 (H) 10/18/2018   ALT 44 10/18/2018   AST 26 10/18/2018   NA 136 10/20/2018   K 4.2 10/20/2018   CL 98 10/20/2018   CREATININE 1.10 10/20/2018   BUN 15 10/20/2018   CO2 28 10/20/2018   TSH 2.006 10/17/2018   INR 1.13 09/26/2017   HGBA1C 9.9 (H) 10/17/2018     BNP (last 3 results) Recent Labs    10/17/18 0109  BNP 419.1*    ProBNP (last 3 results) No results for input(s): PROBNP in the last 8760 hours.   Other Studies Reviewed Today:  ECHO IMPRESSIONS 10/2018   1. The left ventricle has moderately reduced systolic function, with an ejection fraction of 35-40%. The cavity size was normal. There is moderate concentric left ventricular hypertrophy. Left ventricular diastolic function could not be evaluated  secondary to atrial fibrillation.  2. The right ventricle has moderately reduced systolic function. The cavity was normal. There is no increase in right ventricular wall thickness. Right ventricular systolic pressure is mildly elevated.  3. Mitral valve regurgitation is mild to moderate by color flow Doppler.  4. The tricuspid valve is grossly normal.  5. No stenosis of the aortic valve.  6. The inferior vena cava was dilated in size with <50% respiratory variability.  7. The interatrial septum was not well visualized.   Assessment/Plan:  1. Persistent AF - prior failed cardioversion - was placed on Sotalol - failed to follow up for repeat attempt at cardioversion last year due to significant financial concerns - would most likely need AAD therapy again - for now, trying to control his rate - Coreg is increased today.   2. Chronic anticoagulation - on Eliquis - will try to get patient assistance arranged - no active problems noted.   3. Chronic systolic HF - probably tachycardia mediated or from uncontrolled HTN. Cannot exclude CAD or viral etiology. No excessive alcohol history. Noted that the plan outlined was to control heart rate and blood pressure and then repeat echocardiogram in 3 months. If LV function remains decreased would need ischemia evaluation at that time. He looks good clinically -NYHA I - will cut Lasix back to just 40 mg a day. Coreg increased to 25 mg BID. Probably stop potassium but will check lab first.  Needs patient assistance for Entresto - could change back to ARB (sounds like on Losartan in the past). Lab today.   4. Non compliance - had significant financial issues - now back employed but still no health insurance. He does seem quite motivated. Needs to get back to work - letter given today.   5. HTN - BP looks ok today - see #3.   6. DM - per PCP - poorly controlled by most recent A1C - [facility]   7. COVID-19 Education: The signs and symptoms of COVID-19 were discussed with the patient and how to seek care for testing (follow up with PCP or arrange E-visit).  The importance of social distancing, staying at home, hand hygiene and wearing a mask when out in public were discussed today.  Current medicines are reviewed with the patient today.  The patient does not have concerns regarding medicines other than what has been noted above.  The following changes have been made:  See above.  Labs/ tests ordered today include:    Orders Placed This  Encounter  Procedures  . Basic metabolic panel  . CBC  . Digoxin level  . EKG 12-Lead  Disposition:   FU with me in about 2 to 3 weeks. Lab today. 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    Patient is agreeable to this plan and will call if any problems develop in the interim.   SignedNorma Fredrickson, NP  10/30/2018 11:48 AM  [facility] [address] [place], [place]  [address] Phone: [phone] Fax: [phone]

## 2018-10-30 ENCOUNTER — Ambulatory Visit (INDEPENDENT_AMBULATORY_CARE_PROVIDER_SITE_OTHER): Payer: Self-pay | Admitting: Nurse Practitioner

## 2018-10-30 ENCOUNTER — Other Ambulatory Visit: Payer: Self-pay

## 2018-10-30 ENCOUNTER — Encounter: Payer: Self-pay | Admitting: Nurse Practitioner

## 2018-10-30 VITALS — BP 112/70 | HR 90 | Ht 72.0 in | Wt 189.0 lb

## 2018-10-30 DIAGNOSIS — I429 Cardiomyopathy, unspecified: Secondary | ICD-10-CM

## 2018-10-30 DIAGNOSIS — Z9119 Patient's noncompliance with other medical treatment and regimen: Secondary | ICD-10-CM

## 2018-10-30 DIAGNOSIS — I4819 Other persistent atrial fibrillation: Secondary | ICD-10-CM

## 2018-10-30 DIAGNOSIS — Z79899 Other long term (current) drug therapy: Secondary | ICD-10-CM

## 2018-10-30 DIAGNOSIS — Z91199 Patient's noncompliance with other medical treatment and regimen due to unspecified reason: Secondary | ICD-10-CM

## 2018-10-30 DIAGNOSIS — Z7901 Long term (current) use of anticoagulants: Secondary | ICD-10-CM

## 2018-10-30 DIAGNOSIS — Z7189 Other specified counseling: Secondary | ICD-10-CM

## 2018-10-30 DIAGNOSIS — I10 Essential (primary) hypertension: Secondary | ICD-10-CM

## 2018-10-30 DIAGNOSIS — I5022 Chronic systolic (congestive) heart failure: Secondary | ICD-10-CM

## 2018-10-30 MED ORDER — SACUBITRIL-VALSARTAN 24-26 MG PO TABS: 1.0000 | ORAL_TABLET | Freq: Two times a day (BID) | ORAL | 11 refills | Status: DC

## 2018-10-30 MED ORDER — SPIRONOLACTONE 25 MG PO TABS: 12.5000 mg | ORAL_TABLET | Freq: Every day | ORAL | 11 refills | Status: DC

## 2018-10-30 MED ORDER — FUROSEMIDE 40 MG PO TABS: 40.0000 mg | ORAL_TABLET | Freq: Every day | ORAL | 11 refills | Status: DC

## 2018-10-30 MED ORDER — CARVEDILOL 25 MG PO TABS: 25.0000 mg | ORAL_TABLET | Freq: Two times a day (BID) | ORAL | 3 refills | Status: DC

## 2018-10-30 MED ORDER — DIGOXIN 125 MCG PO TABS: 0.1250 mg | ORAL_TABLET | Freq: Every day | ORAL | 11 refills | Status: DC

## 2018-10-30 MED FILL — CARVEDILOL 25 MG TABLET: 25 | 30 days supply | Qty: 60 | Fill #0

## 2018-10-30 NOTE — Patient Instructions (Addendum)
After Visit Summary:  We will be checking the following labs today - Dig level, BMET & CBC   Medication Instructions:    Continue with your current medicines.   I would like to cut the Lasix back to just one pill a day  I would like to increase the Coreg to 25 mg twice a day - ok to take 2 of the 12.5 mg tablets twice a day and use up - the RX for the 25 mg is at the pharmacy  I sent in your refills to [facility] except for the potassium   If you need a refill on your cardiac medications before your next appointment, please call your pharmacy.     Testing/Procedures To Be Arranged:  N/A  Follow-Up:   See me in about 2 to 3 weeks    At [facility], you and your health needs are our priority.  As part of our continuing mission to provide you with exceptional heart care, we have created designated Provider Care Teams.  These Care Teams include your primary Cardiologist (physician) and Advanced Practice Providers (APPs -  Physician Assistants and Nurse Practitioners) who all work together to provide you with the care you need, when you need it.  Special Instructions:  . Stay safe, stay home, wash your hands for at least 20 seconds and wear a mask when out in public.   It was good to talk with you today.   Keep restricting your salt  Weigh daily - use an extra dose of Lasix for weight gain over 2 pounds or more overnight  Plan is to repeat the echo in the middle of October  I will give you a note to return to work - only 40 hours a week   Call the [place] office at [phone] if you have any questions, problems or concerns.

## 2018-10-31 LAB — CBC
Hematocrit: 56.5 % — ABNORMAL HIGH (ref 37.5–51.0)
Hemoglobin: 19.5 g/dL — ABNORMAL HIGH (ref 13.0–17.7)
MCH: 31.8 pg (ref 26.6–33.0)
MCHC: 34.5 g/dL (ref 31.5–35.7)
MCV: 92 fL (ref 79–97)
Platelets: 431 10*3/uL (ref 150–450)
RBC: 6.13 x10E6/uL — ABNORMAL HIGH (ref 4.14–5.80)
RDW: 11.6 % (ref 11.6–15.4)
WBC: 9.6 10*3/uL (ref 3.4–10.8)

## 2018-10-31 LAB — BASIC METABOLIC PANEL
BUN/Creatinine Ratio: 19 (ref 9–20)
BUN: 22 mg/dL (ref 6–24)
CO2: 19 mmol/L — ABNORMAL LOW (ref 20–29)
Calcium: 9.9 mg/dL (ref 8.7–10.2)
Chloride: 93 mmol/L — ABNORMAL LOW (ref 96–106)
Creatinine, Ser: 1.18 mg/dL (ref 0.76–1.27)
GFR calc Af Amer: 79 mL/min/{1.73_m2} (ref >59)
GFR calc non Af Amer: 68 mL/min/{1.73_m2} (ref >59)
Glucose: 331 mg/dL — ABNORMAL HIGH (ref 65–99)
Potassium: 5.1 mmol/L (ref 3.5–5.2)
Sodium: 132 mmol/L — ABNORMAL LOW (ref 134–144)

## 2018-10-31 LAB — DIGOXIN LEVEL: Digoxin, Serum: 0.8 ng/mL (ref 0.5–0.9)

## 2018-11-02 ENCOUNTER — Ambulatory Visit: Payer: Self-pay | Attending: Nurse Practitioner | Admitting: Nurse Practitioner

## 2018-11-02 ENCOUNTER — Other Ambulatory Visit: Payer: Self-pay

## 2018-11-02 ENCOUNTER — Encounter: Payer: Self-pay | Admitting: Nurse Practitioner

## 2018-11-02 DIAGNOSIS — E782 Mixed hyperlipidemia: Secondary | ICD-10-CM

## 2018-11-02 DIAGNOSIS — I4811 Longstanding persistent atrial fibrillation: Secondary | ICD-10-CM

## 2018-11-02 DIAGNOSIS — I10 Essential (primary) hypertension: Secondary | ICD-10-CM

## 2018-11-02 DIAGNOSIS — IMO0002 Reserved for concepts with insufficient information to code with codable children: Secondary | ICD-10-CM

## 2018-11-02 DIAGNOSIS — E1165 Type 2 diabetes mellitus with hyperglycemia: Secondary | ICD-10-CM

## 2018-11-02 MED ORDER — ATORVASTATIN CALCIUM 20 MG PO TABS: 20.0000 mg | ORAL_TABLET | Freq: Every day | ORAL | 3 refills | Status: DC

## 2018-11-02 MED FILL — ATORVASTATIN 20 MG TABLET: 20 | 30 days supply | Qty: 30 | Fill #0

## 2018-11-02 NOTE — Progress Notes (Signed)
Virtual Visit via Telephone Note Due to national recommendations of social distancing due to COVID 19, telehealth visit is felt to be most appropriate for this patient at this time.  I discussed the limitations, risks, security and privacy concerns of performing an evaluation and management service by telephone and the availability of in person appointments. I also discussed with the patient that there may be a patient responsible charge related to this service. The patient expressed understanding and agreed to proceed.    I connected with Rodman KeyMark Macdougal on 11/02/18  at   9:30 AM EDT  EDT by telephone and verified that I am speaking with the correct person using two identifiers.   Consent I discussed the limitations, risks, security and privacy concerns of performing an evaluation and management service by telephone and the availability of in person appointments. I also discussed with the patient that there may be a patient responsible charge related to this service. The patient expressed understanding and agreed to proceed.   Location of Patient: Private Residence   [facility]    Persons participating in Telemedicine visit: Bertram DenverZelda Fleming FNP-BC YY WhittemoreBien CMA Rodman KeyMark Arnall    History of Present Illness: Telemedicine visit for: Establish Care  has a past medical history of Cardiomyopathy [facility](HCC), Chronic anticoagulation (09/30/2017), Diabetes mellitus type 2 in nonobese (NONCOMPLIANT), Does not have health insurance, Essential hypertension (09/26/2017), Financial difficulties, H/O noncompliance with medical treatment, presenting hazards to health, Non-insulin treated type 2 diabetes mellitus (HCC) (09/26/2017), Persistent atrial fibrillation, and Sleep apnea suspected (09/30/2017).  Reports losing his job and insurance earlier this year due to COVID so has not been follow up with a PCP.  Failed cardioversion over a year ago and was placed on sotalol  and Cardizem with plans for repeat attempted cardioversion however patient was lost to follow-up with cardiology and stopped most of his medications when he became unemployed several months ago.    HFU Admitted to the hospital on 10/17/2018 with complaints of increased shortness of breath which is been ongoing for 2 weeks.  Other complaints include a persistent dry cough, bilateral lower extremity edema, sinus pressure and headache.  COVID negative.  He was noted to be in A. fib with RVR along with poorly controlled hypertension.  Chest x-ray showing cardiomegaly with no pulmonary edema.  Was started  on Cardizem drip while inpatient and switched to carvedilol 12.5 mg twice daily and digoxin 0.125 mg daily upon discharge.  CHADSVASC of at least 3.  Per cardiology if patient can demonstrate compliance with all medications may eventually repeat cardioversion.  Most recent EF 10-2018 (35-40%).  He was discharged home on 7-18 with instructions to follow-up with Dr. Eden [facility]msN[facility] and obtain PCP.   He had follow-up visit with cardiology 10/30/2018.  Current medications include Eliquis for persistent A. fib, furosemide 40 mg daily, digoxin 0.125 mg daily, Coreg 25 mg twice daily and spironolactone 12.5 mg daily and Entresto.   He is  using his current prescription  Coreg 12.5 mg and taking 2 tablets twice a day until he runs out.  Once he runs out of the Coreg 12.5 mg he is aware that he is to pick up the new prescription for Coreg 25 mg BID.  BP Readings from Last 3 Encounters:  10/30/18 112/70  10/20/18 104/86  09/30/17 (!) 165/104   He states he is ready to get as healthy as possible.  Has started making healthier changes in his diet and exercising.  Has concerns regarding erectile dysfunction however I have instructed him that we need to focus on better diabetes control and medication compliance prior to prescribing any medications for erectile dysfunction.  He is in agreement.  Will need sleep study  to evaluate for OSA.  Patient has been instructed to apply for the financial assistance program as soon as possible.  No longer smoking!!!  Stop smoking at the beginning of the year.   DM TYPE 2 All due to non-adherence with medication administration, diet and exercise.  Intolerance to metformin with GI side effects.  He was switched to [place] which he stopped taking several months ago when he became unemployed due to cost.  A1c now up to 9.9.  Current medications include Novolog 4 units TID and Levemir 14 units QHS.  He has an upcoming appointment with the diabetic nutritionist in a few weeks.  Limiting his carbohydrate intake to around 60-75 grams.  Monitoring blood glucose levels. Fasting 110. Post prandial: 200s. Has cut back on sugars.  Has been walking every day.  Would like to increase his exercise regimen however will wait for cardiology recommendations regarding this.  We will have him return in 6 weeks with the pharmacist for meter check and ascertain if we need to make any adjustments with his insulin at that time. Lab Results  Component Value Date   HGBA1C 9.9 (H) 10/17/2018      Past Medical History:  Diagnosis Date  . Cardiomyopathy (HCC)    a. EF 45% in 2019.  Marland Kitchen Chronic anticoagulation 09/30/2017  . Diabetes mellitus type 2 in nonobese (HCC)   . Does not have health insurance   . Essential hypertension 09/26/2017  . Financial difficulties   . H/O noncompliance with medical treatment, presenting hazards to health   . Non-insulin treated type 2 diabetes mellitus (HCC) 09/26/2017  . Persistent atrial fibrillation   . Sleep apnea suspected 09/30/2017    Past Surgical History:  Procedure Laterality Date  . APPENDECTOMY  1971  . CARDIOVERSION N/A 09/28/2017   Procedure: CARDIOVERSION;  Surgeon: Thurmon Fair, MD;  Location: [facility];  Service: Cardiovascular;  Laterality: N/A;  . TEE WITHOUT CARDIOVERSION N/A 09/28/2017   Procedure: TRANSESOPHAGEAL ECHOCARDIOGRAM (TEE);   Surgeon: Thurmon Fair, MD;  Location: [facility] ENDOSCOPY;  Service: Cardiovascular;  Laterality: N/A;    Family History  Problem Relation Age of Onset  . Hypertension Mother     Social History   Socioeconomic History  . Marital status: Married    Spouse name: Not on file  . Number of children: Not on file  . Years of education: Not on file  . Highest education level: Not on file  Occupational History  . Not on file  Social Needs  . Financial resource strain: Not on file  . Food insecurity    Worry: Not on file    Inability: Not on file  . Transportation needs    Medical: Not on file    Non-medical: Not on file  Tobacco Use  . Smoking status: Former Smoker    Years: 15.00    Types: Cigarettes    Quit date: 03/18/2018    Years since quitting: 0.6  . Smokeless tobacco: Never Used  . Tobacco comment: 09/26/2017 "2-3 cigarettes/month now"  Substance and Sexual Activity  . Alcohol use: Yes    Alcohol/week: 3.0 standard drinks    Types: 3 Cans of beer per week  . Drug use: Never  . Sexual activity: Not Currently  Lifestyle  .  Physical activity    Days per week: Not on file    Minutes per session: Not on file  . Stress: Very much  Relationships  . Social Herbalist on phone: Not on file    Gets together: Not on file    Attends religious service: Not on file    Active member of club or organization: Not on file    Attends meetings of clubs or organizations: Not on file    Relationship status: Not on file  Other Topics Concern  . Not on file  Social History Narrative  . Not on file     Observations/Objective: Awake, alert and oriented x 3   Review of Systems  Constitutional: Negative for fever, malaise/fatigue and weight loss.  HENT: Negative.  Negative for nosebleeds.   Eyes: Negative.  Negative for blurred vision, double vision and photophobia.  Respiratory: Negative.  Negative for cough and shortness of breath (resolved).   Cardiovascular: Negative.   Negative for chest pain, palpitations and leg swelling (greatly improved).  Gastrointestinal: Negative.  Negative for heartburn, nausea and vomiting.  Genitourinary: Negative for frequency.       Erectile dysfunction  Musculoskeletal: Negative.  Negative for myalgias.  Neurological: Negative.  Negative for dizziness, focal weakness, seizures and headaches.  Psychiatric/Behavioral: Negative.  Negative for suicidal ideas.    Assessment and Plan: Patient declines any medication refills today  Diagnoses and all orders for this visit:  Diabetes mellitus type 2, uncontrolled, with complications ([place]) -     Ambulatory referral to Ophthalmology Diabetes is poorly controlled. Advised patient to keep a fasting blood sugar log fast, 2 hours post lunch and bedtime which will be reviewed at the next office visit.  Longstanding persistent atrial fibrillation Continue Eliquis as prescribed.  He should be to obtain this medication from our community pharmacy at little to no cost.  I instructed him to apply for the medication assistance program here as well. Continue close follow-up with cardiology for A. fib and cardiomyopathy as instructed   Essential hypertension Well-controlled time.  Patient does not monitor his blood pressure at home yet Continue all antihypertensives as prescribed.  Remember to bring in your blood pressure log with you for your follow up appointment.  DASH/Mediterranean Diets are healthier choices for HTN.    Mixed hyperlipidemia -     atorvastatin (LIPITOR) 20 MG tablet; Take 1 tablet (20 mg total) by mouth daily. Patient requires statin based on a ADA guidelines Lab Results  Component Value Date   LDLCALC 161 (H) 10/18/2018  INSTRUCTIONS: Work on a low fat, heart healthy diet and participate in regular aerobic exercise program by working out at least 150 minutes per week; 5 days a week-30 minutes per day. Avoid red meat, fried foods. junk foods, sodas, sugary drinks,  unhealthy snacking, alcohol and smoking.  Drink at least 48oz of water per day and monitor your carbohydrate intake daily.    Follow Up Instructions Return in about 4 weeks (around 11/30/2018).  With pharmacist for meter check    I discussed the assessment and treatment plan with the patient. The patient was provided an opportunity to ask questions and all were answered. The patient agreed with the plan and demonstrated an understanding of the instructions.   The patient was advised to call back or seek an in-person evaluation if the symptoms worsen or if the condition fails to improve as anticipated.  I provided 28 minutes of non-face-to-face time during this encounter including  median intraservice time, reviewing previous notes, labs, imaging, medications and explaining diagnosis and management.  Gildardo Pounds, FNP-BC

## 2018-11-05 ENCOUNTER — Other Ambulatory Visit: Payer: Self-pay | Admitting: *Deleted

## 2018-11-05 ENCOUNTER — Telehealth: Payer: Self-pay | Admitting: Nurse Practitioner

## 2018-11-05 DIAGNOSIS — R718 Other abnormality of red blood cells: Secondary | ICD-10-CM

## 2018-11-05 DIAGNOSIS — I429 Cardiomyopathy, unspecified: Secondary | ICD-10-CM

## 2018-11-05 NOTE — Telephone Encounter (Signed)
New Message    Pt is calling to speak with Danielle    Please call

## 2018-11-09 ENCOUNTER — Other Ambulatory Visit: Payer: Self-pay

## 2018-11-10 NOTE — Progress Notes (Deleted)
CARDIOLOGY OFFICE NOTE  Date:  11/10/2018    David [name] Date of Birth: 06/05/1960 Medical Record #[id number]  PCP:  Claiborne RiggFleming, Zelda W, NP  Cardiologist:  Townsend RogerGerhardt & Nishan    No chief complaint on file.   History of Present Illness: David [name] is a 58 y.o. male who presents today for a 2 week check. Seen for Dr. Eden EmmsNishan.   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   He has a history of poorly controlled DM 2 due to noncompliance, persistent A. fib, mild cardiomyopathy, & suspected OSA. He was recently admitted with AF with RVR with acute on chronic systolic HF.  Noted that the patient failed cardioversion over a year ago (09/2017) - had been placed on Sotalol with plans for repeat cardioversion but this did not happen - he then stopped all of his medicines. EF is now down. He was treated with CCB and digoxin was required for rate control. CHADSVASC of at least 3. He has been placed on Eliquis. If he can demonstrate compliance, then repeat cardioversion could be entertained. [facility]. There was concern that tachycardia was the etiology for his cardiomyopathy. Insulin was initiated.   I then saw him for his post hospital visit. Doing well. Lots of concerns. Noted he had failed to follow up last year due to financial issues - had lost his job/laid off - lost his insurance - did not pay attention to his health, etc. Now realizing it has to be his priority. Seems quite motivated to make changes. Cough improving. Coreg was increased. Lasix was cut back. He was given the ok to return to work. Cost of Sherryll Burgerntresto will probably be an issue. Was trying to establish with [facility] as well.   The patient {does/does not:200015} have symptoms concerning for COVID-19 infection (fever, chills, cough, or new shortness of breath).   Comes in today. Here with   Past Medical History:  Diagnosis Date  .  Cardiomyopathy (HCC)    a. EF 45% in 2019.  Marland Kitchen. Chronic anticoagulation 09/30/2017  . Diabetes mellitus type 2 in nonobese (HCC)   . Does not have health insurance   . Essential hypertension 09/26/2017  . Financial difficulties   . H/O noncompliance with medical treatment, presenting hazards to health   . Non-insulin treated type 2 diabetes mellitus (HCC) 09/26/2017  . Persistent atrial fibrillation   . Sleep apnea suspected 09/30/2017    Past Surgical History:  Procedure Laterality Date  . APPENDECTOMY  1971  . CARDIOVERSION N/A 09/28/2017   Procedure: CARDIOVERSION;  Surgeon: Thurmon Fairroitoru, Mihai, MD;  Location: [facility];  Service: Cardiovascular;  Laterality: N/A;  . TEE WITHOUT CARDIOVERSION N/A 09/28/2017   Procedure: TRANSESOPHAGEAL ECHOCARDIOGRAM (TEE);  Surgeon: Thurmon Fairroitoru, Mihai, MD;  [facility];  Service: Cardiovascular;  Laterality: N/A;     Medications: No outpatient medications have been marked as taking for the 11/12/18 encounter (Appointment) with [name], [name], NP.     Allergies: No Known Allergies  Social History: The patient  reports that he quit smoking about 7 months ago. His smoking use included cigarettes. He quit after 15.00 years of use. He has never used smokeless tobacco. He reports current alcohol use of about 3.0 standard drinks of alcohol per week. He reports that he does not use drugs.   Family History: The patient's ***family history includes Hypertension in his mother.   Review of Systems:  Please see the history of present illness.   All other systems are reviewed and negative.   Physical Exam: VS:  There were no vitals taken for this visit. Marland Kitchen  BMI There is no height or weight on file to calculate BMI.  Wt Readings from Last 3 Encounters:  10/30/18 189 lb (85.7 kg)  10/20/18 192 lb 14.4 oz (87.5 kg)  09/30/17 190 lb 14.4 oz (86.6 kg)    General: Pleasant. Well developed, well nourished and in no acute distress.   HEENT: Normal.   Neck: Supple, no JVD, carotid bruits, or masses noted.  Cardiac: ***Regular rate and rhythm. No murmurs, rubs, or gallops. No edema.  Respiratory:  Lungs are clear to auscultation bilaterally with normal work of breathing.  GI: Soft and nontender.  MS: No deformity or atrophy. Gait and ROM intact.  Skin: Warm and dry. Color is normal.  Neuro:  Strength and sensation are intact and no gross focal deficits noted.  Psych: Alert, appropriate and with normal affect.   LABORATORY DATA:  EKG:  EKG {ACTION; IS/IS LKG:[id number]} ordered today. This demonstrates ***.  Lab Results  Component Value Date   WBC 9.6 10/30/2018   HGB 19.5 (H) 10/30/2018   HCT 56.5 (H) 10/30/2018   PLT 431 10/30/2018   GLUCOSE 331 (H) 10/30/2018   CHOL 215 (H) 10/18/2018   TRIG 103 10/18/2018   HDL 33 (L) 10/18/2018   LDLCALC 161 (H) 10/18/2018   ALT 44 10/18/2018   AST 26 10/18/2018   NA 132 (L) 10/30/2018   K 5.1 10/30/2018   CL 93 (L) 10/30/2018   CREATININE 1.18 10/30/2018   BUN 22 10/30/2018   CO2 19 (L) 10/30/2018   TSH 2.006 10/17/2018   INR 1.13 09/26/2017   HGBA1C 9.9 (H) 10/17/2018     BNP (last 3 results) Recent Labs    10/17/18 0109  BNP 419.1*    ProBNP (last 3 results) No results for input(s): PROBNP in the last 8760 hours.   Other Studies Reviewed Today:  ECHO IMPRESSIONS 10/2018  1. The left ventricle has moderately reduced systolic function, with an ejection fraction of 35-40%. The cavity size was normal. There is moderate concentric left ventricular hypertrophy. Left ventricular diastolic function could not be evaluated  secondary to atrial fibrillation. 2. The right ventricle has moderately reduced systolic function. The cavity was normal. There is no increase in right ventricular wall thickness. Right ventricular systolic pressure is mildly elevated. 3. Mitral valve regurgitation is mild to moderate by color flow Doppler. 4. The tricuspid valve is grossly normal. 5.  No stenosis of the aortic valve. 6. The inferior vena cava was dilated in size with <50% respiratory variability. 7. The interatrial septum was not well visualized.  Assessment/Plan:  1. Persistent AF - prior failed cardioversion - was placed on Sotalol - failed to follow up for repeat attempt at cardioversion last year due to significant financial concerns - would most likely need AAD therapy again - for now, trying to control his rate - Coreg is increased today.   2. Chronic anticoagulation - on Eliquis - will try to get patient assistance arranged - no active problems noted.   3. Chronic systolic HF - probably tachycardia mediated or from uncontrolled HTN. Cannot exclude CAD or viral etiology. No excessive alcohol history. Noted that the plan outlined was "to control heart rate and blood pressure and then repeat echocardiogram in 3 months. If LV function remains decreased would need ischemia evaluation at that time".  He looks good clinically -NYHA I - will cut Lasix back to just 40 mg a day. Coreg increased to 25 mg BID. Probably stop potassium but will check lab first.  Needs patient assistance for Entresto - could change back to ARB (sounds like on Losartan in the past). Lab today.   4. Non compliance - had significant financial issues - now back employed but still no health insurance. He does seem quite motivated. Needs to get back to work - letter given today.   5. HTN - BP looks ok today - see #3.   6. DM - per PCP - poorly controlled by most recent A1C - he is trying to establish at [facility].   7. COVID-19 Education: The signs and symptoms of COVID-19 were discussed with the patient and how to seek care for testing (follow up with PCP or arrange E-visit).  The importance of social distancing, staying at home, hand hygiene and wearing a mask when out in public were discussed today.  Current medicines are reviewed with the patient today.  The patient does not have  concerns regarding medicines other than what has been noted above.  The following changes have been made:  See above.  Labs/ tests ordered today include:   No orders of the defined types were placed in this encounter.    Disposition:   FU with *** in {gen number 1-16:579038} {Days to years:10300}.   Patient is agreeable to this plan and will call if any problems develop in the interim.   SignedTruitt Merle, NP  11/10/2018 11:58 AM  [facility] [address] [place], [place]  [address] Phone: [phone] Fax: [phone]

## 2018-11-12 ENCOUNTER — Other Ambulatory Visit: Payer: Self-pay

## 2018-11-12 ENCOUNTER — Ambulatory Visit: Payer: Self-pay | Admitting: Nurse Practitioner

## 2018-11-12 NOTE — Telephone Encounter (Signed)
  Patient is returning call and is out of town due to death in family and will not be here until Thursday night. He said he can come in Friday morning to do his blood work.

## 2018-11-12 NOTE — Telephone Encounter (Signed)
S/w pt about appt that was missed today per Cecille Rubin.  Pt stated was busy and will call back.

## 2018-11-16 ENCOUNTER — Other Ambulatory Visit: Payer: Self-pay

## 2018-11-16 ENCOUNTER — Other Ambulatory Visit: Payer: Self-pay | Admitting: *Deleted

## 2018-11-16 DIAGNOSIS — R718 Other abnormality of red blood cells: Secondary | ICD-10-CM

## 2018-11-16 DIAGNOSIS — I429 Cardiomyopathy, unspecified: Secondary | ICD-10-CM

## 2018-11-16 LAB — CBC WITH DIFFERENTIAL/PLATELET
Basophils Absolute: 0.1 10*3/uL (ref 0.0–0.2)
Basos: 2 %
EOS (ABSOLUTE): 0.4 10*3/uL (ref 0.0–0.4)
Eos: 5 %
Hematocrit: 53.1 % — ABNORMAL HIGH (ref 37.5–51.0)
Hemoglobin: 18.1 g/dL — ABNORMAL HIGH (ref 13.0–17.7)
Immature Grans (Abs): 0 10*3/uL (ref 0.0–0.1)
Immature Granulocytes: 0 %
Lymphocytes Absolute: 1.7 10*3/uL (ref 0.7–3.1)
Lymphs: 24 %
MCH: 31.6 pg (ref 26.6–33.0)
MCHC: 34.1 g/dL (ref 31.5–35.7)
MCV: 93 fL (ref 79–97)
Monocytes Absolute: 0.9 10*3/uL (ref 0.1–0.9)
Monocytes: 13 %
Neutrophils Absolute: 4 10*3/uL (ref 1.4–7.0)
Neutrophils: 56 %
Platelets: 188 10*3/uL (ref 150–450)
RBC: 5.72 x10E6/uL (ref 4.14–5.80)
RDW: 11.6 % (ref 11.6–15.4)
WBC: 7.1 10*3/uL (ref 3.4–10.8)

## 2018-11-16 LAB — BASIC METABOLIC PANEL
BUN/Creatinine Ratio: 11 (ref 9–20)
BUN: 12 mg/dL (ref 6–24)
CO2: 24 mmol/L (ref 20–29)
Calcium: 9.4 mg/dL (ref 8.7–10.2)
Chloride: 96 mmol/L (ref 96–106)
Creatinine, Ser: 1.05 mg/dL (ref 0.76–1.27)
GFR calc Af Amer: 91 mL/min/{1.73_m2} (ref >59)
GFR calc non Af Amer: 78 mL/min/{1.73_m2} (ref >59)
Glucose: 262 mg/dL — ABNORMAL HIGH (ref 65–99)
Potassium: 3.9 mmol/L (ref 3.5–5.2)
Sodium: 135 mmol/L (ref 134–144)

## 2018-11-16 MED FILL — CARVEDILOL 25 MG TABLET: 25 | 30 days supply | Qty: 60 | Fill #0

## 2018-11-16 MED FILL — DIGOXIN 0.125 MG TABLET: 125 | 30 days supply | Qty: 30 | Fill #0

## 2018-11-16 MED FILL — ATORVASTATIN 20 MG TABLET: 20 | 30 days supply | Qty: 30 | Fill #0

## 2018-11-16 MED FILL — SPIRONOLACTONE 25 MG TABLET: 25 | 30 days supply | Qty: 15 | Fill #0

## 2018-11-16 MED FILL — ENTRESTO 24 MG-26 MG TABLET: 24-26 | 30 days supply | Qty: 60 | Fill #0

## 2018-11-16 MED FILL — FUROSEMIDE 40 MG TAB: 40 | 30 days supply | Qty: 30 | Fill #0

## 2018-11-19 ENCOUNTER — Telehealth: Payer: Self-pay

## 2018-11-19 NOTE — Telephone Encounter (Signed)
**Note De-Identified  Obfuscation** The provider parts of a [employer] and a [employer] pt asst applications were left at the office. We have completed both applications, Dr Burt Knack (DOD) has signed them, and we faxed both to each pt asst programs.

## 2018-11-20 NOTE — Telephone Encounter (Signed)
Letter received from [employer] stating that they have approved the pt for pt asst with his Eliquis. Approval good from 11/19/2018 until 2/35/3614 Application caes# [id number]

## 2018-11-21 ENCOUNTER — Ambulatory Visit: Payer: Self-pay | Admitting: Registered"

## 2018-11-30 ENCOUNTER — Ambulatory Visit: Payer: Self-pay | Admitting: Pharmacist

## 2018-11-30 NOTE — Progress Notes (Deleted)
CARDIOLOGY OFFICE NOTE  Date:  11/30/2018    David [name] Date of Birth: Apr 09, 1960 Medical Record #[id number]  PCP:  Gildardo Pounds, NP  Cardiologist:  Gillian Shields    No chief complaint on file.   History of Present Illness: David [name] is a 58 y.o. male who presents today for a follow up visit. Seen for Dr. Johnsie Cancel.   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   He has a history of poorly controlled DM 2 due to noncompliance, persistent A. fib, mild cardiomyopathy, & suspected OSA. He was admitted back this summer with AF with RVR with acute on chronic systolic HF.  Noted that the patient failed cardioversion over a year ago (09/2017) - had been placed on Sotalol with plans for repeat cardioversion but this did not happen - he then stopped all of his medicines. EF is now down. He was treated with CCB and digoxin was required for rate control. CHADSVASC of at least 3. He has been placed on Eliquis. If he can demonstrate compliance, then repeat cardioversion could be entertained. Losartan was changed to [facility]. There was concern that tachycardia was the etiology for his cardiomyopathy. Insulin was initiated.   I saw him for his post hospital visit in late July - lots of concerns. He failed to follow up due to significant financial issues. Seemed ready to make his health a priority - he was taking his medicines. Trying to get to [employer]. Cough was improving. I let him return to work.   The patient {does/does not:200015} have symptoms concerning for COVID-19 infection (fever, chills, cough, or new shortness of breath).   Comes in today. Here with   Past Medical History:  Diagnosis Date  . Cardiomyopathy ([place])    a. EF 45% in 2019.  Marland Kitchen Chronic anticoagulation 09/30/2017  . Diabetes mellitus type 2 in nonobese (HCC)   . Does not have health insurance   . Essential hypertension 09/26/2017  . Financial difficulties   . H/O noncompliance  with medical treatment, presenting hazards to health   . Non-insulin treated type 2 diabetes mellitus ([place]) 09/26/2017  . Persistent atrial fibrillation   . Sleep apnea suspected 09/30/2017    Past Surgical History:  Procedure Laterality Date  . APPENDECTOMY  1971  . CARDIOVERSION N/A 09/28/2017   Procedure: CARDIOVERSION;  Surgeon: Sanda Klein, MD;  Location: [facility] ENDOSCOPY;  Service: Cardiovascular;  Laterality: N/A;  . TEE WITHOUT CARDIOVERSION N/A 09/28/2017   Procedure: TRANSESOPHAGEAL ECHOCARDIOGRAM (TEE);  Surgeon: Sanda Klein, MD;  Location: [facility] ENDOSCOPY;  Service: Cardiovascular;  Laterality: N/A;     Medications: No outpatient medications have been marked as taking for the 12/03/18 encounter (Appointment) with Burtis Junes, NP.     Allergies: No Known Allergies  Social History: The patient  reports that he quit smoking about 8 months ago. His smoking use included cigarettes. He quit after 15.00 years of use. He has never used smokeless tobacco. He reports current alcohol use of about 3.0 standard drinks of alcohol per week. He reports that he does not use drugs.   Family History: The patient's ***family history includes Hypertension in his mother.   Review of Systems: Please see the history of present illness.   All other systems are reviewed and negative.   Physical Exam: VS:  There were no vitals taken for this visit. Marland Kitchen  BMI There is no height or weight on file to calculate BMI.  Wt Readings from Last 3 Encounters:  10/30/18 189 lb (85.7 kg)  10/20/18 192 lb 14.4 oz (87.5 kg)  09/30/17 190 lb 14.4 oz (86.6 kg)    General: Pleasant. Well developed, well nourished and in no acute distress.   HEENT: Normal.  Neck: Supple, no JVD, carotid bruits, or masses noted.  Cardiac: ***Regular rate and rhythm. No murmurs, rubs, or gallops. No edema.  Respiratory:  Lungs are clear to auscultation bilaterally with normal work of breathing.  GI: Soft and nontender.   MS: No deformity or atrophy. Gait and ROM intact.  Skin: Warm and dry. Color is normal.  Neuro:  Strength and sensation are intact and no gross focal deficits noted.  Psych: Alert, appropriate and with normal affect.   LABORATORY DATA:  EKG:  EKG {ACTION; IS/IS JXB:[id number]}OT:[id number]} ordered today. This demonstrates ***.  Lab Results  Component Value Date   WBC 7.1 11/16/2018   HGB 18.1 (H) 11/16/2018   HCT 53.1 (H) 11/16/2018   PLT 188 11/16/2018   GLUCOSE 262 (H) 11/16/2018   CHOL 215 (H) 10/18/2018   TRIG 103 10/18/2018   HDL 33 (L) 10/18/2018   LDLCALC 161 (H) 10/18/2018   ALT 44 10/18/2018   AST 26 10/18/2018   NA 135 11/16/2018   K 3.9 11/16/2018   CL 96 11/16/2018   CREATININE 1.05 11/16/2018   BUN 12 11/16/2018   CO2 24 11/16/2018   TSH 2.006 10/17/2018   INR 1.13 09/26/2017   HGBA1C 9.9 (H) 10/17/2018     BNP (last 3 results) Recent Labs    10/17/18 0109  BNP 419.1*    ProBNP (last 3 results) No results for input(s): PROBNP in the last 8760 hours.   Other Studies Reviewed Today:  ECHO IMPRESSIONS 10/2018  1. The left ventricle has moderately reduced systolic function, with an ejection fraction of 35-40%. The cavity size was normal. There is moderate concentric left ventricular hypertrophy. Left ventricular diastolic function could not be evaluated  secondary to atrial fibrillation. 2. The right ventricle has moderately reduced systolic function. The cavity was normal. There is no increase in right ventricular wall thickness. Right ventricular systolic pressure is mildly elevated. 3. Mitral valve regurgitation is mild to moderate by color flow Doppler. 4. The tricuspid valve is grossly normal. 5. No stenosis of the aortic valve. 6. The inferior vena cava was dilated in size with <50% respiratory variability. 7. The interatrial septum was not well visualized.  Assessment/Plan:  1. Persistent AF - prior failed cardioversion - was placed on Sotalol  - failed to follow up for repeat attempt at cardioversion last year due to significant financial concerns - would most likely need AAD therapy again - for now, trying to control his rate - Coreg is increased today.   2. Chronic anticoagulation - on Eliquis - will try to get patient assistance arranged - no active problems noted.   3. Chronic systolic HF - probably tachycardia mediated or from uncontrolled HTN. Cannot exclude CAD or viral etiology. No excessive alcohol history. Noted that the plan outlined was to control heart rate and blood pressure and then repeat echocardiogram in 3 months. If LV function remains decreased would need ischemia evaluation at that time. He looks good clinically -NYHA I - will cut Lasix back to just 40 mg a day. Coreg increased to 25 mg BID. Probably stop potassium but will check lab first.  Needs patient assistance for Entresto - could change back to ARB (sounds like on Losartan in  the past). Lab today.   4. Non compliance - had significant financial issues - now back employed but still no health insurance. He does seem quite motivated. Needs to get back to work - letter given today.   5. HTN - BP looks ok today - see #3.   6. DM - per PCP - poorly controlled by most recent A1C - he is trying to establish at [facility].   7. COVID-19 Education: The signs and symptoms of COVID-19 were discussed with the patient and how to seek care for testing (follow up with PCP or arrange E-visit).  The importance of social distancing, staying at home, hand hygiene and wearing a mask when out in public were discussed today.  Current medicines are reviewed with the patient today.  The patient does not have concerns regarding medicines other than what has been noted above.  The following changes have been made:  See above.  Labs/ tests ordered today include:   No orders of the defined types were placed in this encounter.    Disposition:   FU with *** in {gen  number 5-42:706237} {Days to years:10300}.   Patient is agreeable to this plan and will call if any problems develop in the interim.   SignedNorma Fredrickson, NP  11/30/2018 7:26 AM  [facility] [address] [place], [place]  [address] Phone: [phone] Fax: [phone]

## 2018-12-03 ENCOUNTER — Ambulatory Visit: Payer: Self-pay | Admitting: Pharmacist

## 2018-12-03 ENCOUNTER — Ambulatory Visit: Payer: Self-pay | Admitting: Nurse Practitioner

## 2018-12-05 ENCOUNTER — Telehealth: Payer: Self-pay | Admitting: Nurse Practitioner

## 2018-12-05 MED FILL — !NOVOLOG FLEXPEN SYRINGE 1: 100/ML | 25 days supply | Qty: 3 | Fill #0

## 2018-12-05 NOTE — Telephone Encounter (Signed)
**Note De-Identified  Obfuscation** The pt is aware that he was approved for pt asst through [employer] (E[employer]is) and [employer] Delene Loll). He states that he has received his Delene Loll but not his Eliquis and that he is completely out.   I gave him [employer] phone number and advised him to call them ASAP to find out when he will receive his Eliquis. I also advised him that we will leave him 1 sample bottle of Eliquis 5 mg at our Covid-19 screening table in the downstairs lobby of the [facility] office located at [facility].  He verbalized understanding and thanked me for my help.

## 2018-12-05 NOTE — Telephone Encounter (Signed)
Wyonia Hough, LPN can you assist on this, it looks like pt needs a refill, because you noted that the pt was approved to get his Eliquis. Please address thanks

## 2018-12-05 NOTE — Telephone Encounter (Signed)
   Patient calling the office for samples of medication:    1.  What medication and dosage are you requesting samples for? ELIQUIS  2.  Are you currently out of this medication? YES   

## 2018-12-28 MED FILL — SPIRONOLACTONE 25 MG TABLET: 25 | 30 days supply | Qty: 15 | Fill #1

## 2018-12-28 MED FILL — !NOVOLOG FLEXPEN SYRINGE 1: 100/ML | 25 days supply | Qty: 3 | Fill #0

## 2018-12-28 MED FILL — CARVEDILOL 25 MG TABLET: 25 | 30 days supply | Qty: 60 | Fill #1

## 2018-12-28 MED FILL — FUROSEMIDE 40 MG TAB: 40 | 30 days supply | Qty: 30 | Fill #1

## 2018-12-28 MED FILL — LEVEMIR FLEXTOUCH 100 UNITS: 100 | 21 days supply | Qty: 3 | Fill #0

## 2018-12-28 MED FILL — DIGITEK 125 MCG TABLET: 125 | 30 days supply | Qty: 30 | Fill #1

## 2018-12-28 MED FILL — ATORVASTATIN 20 MG TABLET: 20 | 30 days supply | Qty: 30 | Fill #1

## 2019-01-23 NOTE — Progress Notes (Deleted)
CARDIOLOGY OFFICE NOTE  Date:  01/23/2019    David [name] Date of Birth: Mar 22, 1961 Medical Record #[id number]  PCP:  Gildardo Pounds, NP  Cardiologist:  Gillian Shields  No chief complaint on file.   History of Present Illness: David [name] is a 58 y.o. male who presents today for a follow up visit.  Seen for Dr. Johnsie Cancel.   He has a history of poorly controlled DM 2 due to noncompliance, persistent A. fib, mild cardiomyopathy, & suspected OSA. He was recently admitted with AF with RVR with acute on chronic systolic HF.  Noted that the patient failed cardioversion over a year ago (09/2017) - had been placed on Sotalol with plans for repeat cardioversion but this did not happen - he then stopped all of his medicines. EF is now down. He was treated with CCB and digoxin was required for rate control. CHADSVASC of at least 3. He has been placed on Eliquis. If he can demonstrate compliance, then repeat cardioversion could be entertained. Losartan was changed to [facility]. There was concern that tachycardia was the etiology for his cardiomyopathy. Insulin was initiated.   I saw him for his post hospital visit in July - lots of concerns - had failed to follow up last year due to financial issues - had lost his job/laid off - lost his insurance, etc. Seemed to be more motivated to be compliant but he has already failed to follow up. He was doing ok clinically.   The patient {does/does not:200015} have symptoms concerning for COVID-19 infection (fever, chills, cough, or new shortness of breath).   Comes in today. Here with   Past Medical History:  Diagnosis Date  . Cardiomyopathy ([place])    a. EF 45% in 2019.  Marland Kitchen Chronic anticoagulation 09/30/2017  . Diabetes mellitus type 2 in nonobese (HCC)   . Does not have health insurance   . Essential hypertension 09/26/2017  . Financial difficulties   . H/O noncompliance with medical treatment, presenting hazards to health   . Non-insulin treated  type 2 diabetes mellitus ([place]) 09/26/2017  . Persistent atrial fibrillation   . Sleep apnea suspected 09/30/2017    Past Surgical History:  Procedure Laterality Date  . APPENDECTOMY  1971  . CARDIOVERSION N/A 09/28/2017   Procedure: CARDIOVERSION;  Surgeon: Sanda Klein, MD;  Location: [facility] ENDOSCOPY;  Service: Cardiovascular;  Laterality: N/A;  . TEE WITHOUT CARDIOVERSION N/A 09/28/2017   Procedure: TRANSESOPHAGEAL ECHOCARDIOGRAM (TEE);  Surgeon: Sanda Klein, MD;  Location: [facility] ENDOSCOPY;  Service: Cardiovascular;  Laterality: N/A;     Medications: No outpatient medications have been marked as taking for the 01/28/19 encounter (Appointment) with Burtis Junes, NP.     Allergies: No Known Allergies  Social History: The patient  reports that he quit smoking about 10 months ago. His smoking use included cigarettes. He quit after 15.00 years of use. He has never used smokeless tobacco. He reports current alcohol use of about 3.0 standard drinks of alcohol per week. He reports that he does not use drugs.   Family History: The patient's ***family history includes Hypertension in his mother.   Review of Systems: Please see the history of present illness.   All other systems are reviewed and negative.   Physical Exam: VS:  There were no vitals taken for this visit. Marland Kitchen  BMI There is no height or weight on file to calculate BMI.  Wt Readings from Last 3 Encounters:  10/30/18 189 lb (85.7 kg)  10/20/18 192 lb 14.4 oz (87.5 kg)  09/30/17 190 lb 14.4 oz (86.6 kg)    General: Pleasant. Well developed, well nourished and in no acute distress.   HEENT: Normal.  Neck: Supple, no JVD, carotid bruits, or masses noted.  Cardiac: ***Regular rate and rhythm. No murmurs, rubs, or gallops. No edema.  Respiratory:  Lungs are clear to auscultation bilaterally with normal work of breathing.  GI: Soft and nontender.  MS: No deformity or atrophy. Gait and ROM intact.  Skin: Warm and dry. Color  is normal.  Neuro:  Strength and sensation are intact and no gross focal deficits noted.  Psych: Alert, appropriate and with normal affect.   LABORATORY DATA:  EKG:  EKG {ACTION; IS/IS [id number]} ordered today. This demonstrates ***.  Lab Results  Component Value Date   WBC 7.1 11/16/2018   HGB 18.1 (H) 11/16/2018   HCT 53.1 (H) 11/16/2018   PLT 188 11/16/2018   GLUCOSE 262 (H) 11/16/2018   CHOL 215 (H) 10/18/2018   TRIG 103 10/18/2018   HDL 33 (L) 10/18/2018   LDLCALC 161 (H) 10/18/2018   ALT 44 10/18/2018   AST 26 10/18/2018   NA 135 11/16/2018   K 3.9 11/16/2018   CL 96 11/16/2018   CREATININE 1.05 11/16/2018   BUN 12 11/16/2018   CO2 24 11/16/2018   TSH 2.006 10/17/2018   INR 1.13 09/26/2017   HGBA1C 9.9 (H) 10/17/2018     BNP (last 3 results) Recent Labs    10/17/18 0109  BNP 419.1*    ProBNP (last 3 results) No results for input(s): PROBNP in the last 8760 hours.   Other Studies Reviewed Today:  ECHO IMPRESSIONS 10/2018  1. The left ventricle has moderately reduced systolic function, with an ejection fraction of 35-40%. The cavity size was normal. There is moderate concentric left ventricular hypertrophy. Left ventricular diastolic function could not be evaluated  secondary to atrial fibrillation. 2. The right ventricle has moderately reduced systolic function. The cavity was normal. There is no increase in right ventricular wall thickness. Right ventricular systolic pressure is mildly elevated. 3. Mitral valve regurgitation is mild to moderate by color flow Doppler. 4. The tricuspid valve is grossly normal. 5. No stenosis of the aortic valve. 6. The inferior vena cava was dilated in size with <50% respiratory variability. 7. The interatrial septum was not well visualized.  Assessment/Plan:  1. Persistent AF - prior failed cardioversion - was placed on Sotalol - failed to follow up for repeat attempt at cardioversion last year due to  significant financial concerns - would most likely need AAD therapy again - for now, trying to control his rate - Coreg is increased today.   2. Chronic anticoagulation - on Eliquis - will try to get patient assistance arranged - no active problems noted.   3. Chronic systolic HF - probably tachycardia mediated or from uncontrolled HTN. Cannot exclude CAD or viral etiology. No excessive alcohol history. Noted that the plan outlined was to control heart rate and blood pressure and then repeat echocardiogram in 3 months. If LV function remains decreased would need ischemia evaluation at that time. He looks good clinically -NYHA I - will cut Lasix back to just 40 mg a day. Coreg increased to 25 mg BID. Probably stop potassium but will check lab first.  Needs patient assistance for Entresto - could change back to ARB (sounds like on Losartan in the past). Lab today.   4. Non compliance - had significant financial  issues - now back employed but still no health insurance. He does seem quite motivated. Needs to get back to work - letter given today.   5. HTN - BP looks ok today - see #3.   6. DM - per PCP - poorly controlled by most recent A1C - he is trying to establish at [facility].   7. COVID-19 Education: The signs and symptoms of COVID-19 were discussed with the patient and how to seek care for testing (follow up with PCP or arrange E-visit).  The importance of social distancing, staying at home, hand hygiene and wearing a mask when out in public were discussed today.  Current medicines are reviewed with the patient today.  The patient does not have concerns regarding medicines other than what has been noted above.  The following changes have been made:  See above.  Labs/ tests ordered today include:   No orders of the defined types were placed in this encounter.    Disposition:   FU with *** in {gen number 7-78:[id number]} {Days to years:10300}.   Patient is agreeable to this  plan and will call if any problems develop in the interim.   SignedNorma Fredrickson, NP  01/23/2019 6:58 AM  [facility] [address] [place], [place]  [address] Phone: [phone] Fax: [phone]

## 2019-01-28 ENCOUNTER — Ambulatory Visit: Payer: Self-pay | Admitting: Nurse Practitioner

## 2019-01-28 ENCOUNTER — Ambulatory Visit: Payer: Self-pay | Admitting: Pharmacist

## 2019-02-05 MED FILL — !NOVOLOG FLEXPEN SYRINGE 1: 100/ML | 25 days supply | Qty: 3 | Fill #1

## 2019-02-05 MED FILL — CARVEDILOL 25 MG TABLET: 25 | 30 days supply | Qty: 60 | Fill #2

## 2019-02-05 MED FILL — FUROSEMIDE 40 MG TAB: 40 | 30 days supply | Qty: 30 | Fill #2

## 2019-02-05 MED FILL — SPIRONOLACTONE 25 MG TABLET: 25 | 30 days supply | Qty: 15 | Fill #2

## 2019-02-05 MED FILL — !LEVEMIR FLEXPEN 100UNITS/M: 100U/ML (3) | 21 days supply | Qty: 3 | Fill #1

## 2019-02-05 MED FILL — ATORVASTATIN CALCIUM 20 MG: 20 | 30 days supply | Qty: 30 | Fill #2

## 2019-02-05 MED FILL — DIGITEK 125 MCG TABLET: 125 | 30 days supply | Qty: 30 | Fill #2

## 2019-02-22 MED FILL — LEVEMIR FLEXTOUCH 100 UNITS: 100 | 21 days supply | Qty: 3 | Fill #1

## 2019-02-22 MED FILL — DIGITEK 125 MCG TABLET: 125 | 30 days supply | Qty: 30 | Fill #2

## 2019-02-22 MED FILL — ATORVASTATIN CALCIUM 20 MG: 20 | 30 days supply | Qty: 30 | Fill #2

## 2019-02-22 MED FILL — CARVEDILOL 25 MG TABLET: 25 | 30 days supply | Qty: 60 | Fill #2

## 2019-02-22 MED FILL — FUROSEMIDE 40 MG TAB: 40 | 30 days supply | Qty: 30 | Fill #2

## 2019-02-22 MED FILL — !NOVOLOG FLEXPEN SYRINGE 1: 100/ML | 25 days supply | Qty: 3 | Fill #1

## 2019-02-22 MED FILL — SPIRONOLACTONE 25 MG TABLET: 25 | 30 days supply | Qty: 15 | Fill #2

## 2019-02-26 NOTE — Progress Notes (Deleted)
CARDIOLOGY OFFICE NOTE  Date:  02/26/2019    [name] Date of Birth: [birth year] Medical Record #[id number]  PCP:  Gildardo Pounds, NP  Cardiologist:  Gillian Shields  No chief complaint on file.   History of Present Illness: David [name] is a 58 y.o. male who presents today for what was to be a 4 week check - now a 5 month visit.  Seen for Dr. Johnsie Cancel.   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   He has a history of poorly controlled DM 2 due to noncompliance, persistent A. fib, mild cardiomyopathy, & suspected OSA. He was admitted back this summer with AF with RVR with acute on chronic systolic HF.  Noted that the patient failed cardioversion over a year ago (09/2017) - had been placed on Sotalol with plans for repeat cardioversion but this did not happen - he then stopped all of his medicines. EF is now down. He was treated with CCB and digoxin was required for rate control. CHADSVASC of at least 3. He has been placed on Eliquis. If he can demonstrate compliance, then repeat cardioversion could be entertained. Losartan was changed to [facility]. There was concern that tachycardia was the etiology for his cardiomyopathy. Insulin was initiated.   I then saw him in July for his post hospital visit. Financial issues were the culprit for his not following up. He was doing well clinically. Was wanting to go back to work. At that visit, he seemed motivated to be compliant - unfortunately, he has not followed back up.   The patient {does/does not:200015} have symptoms concerning for COVID-19 infection (fever, chills, cough, or new shortness of breath).   Comes in today. Here with   Past Medical History:  Diagnosis Date  . Cardiomyopathy ([place])    a. EF 45% in 2019.  Marland Kitchen Chronic anticoagulation 09/30/2017  . Diabetes mellitus type 2 in nonobese (HCC)   . Does not have health insurance   . Essential hypertension 09/26/2017  . Financial difficulties   .  H/O noncompliance with medical treatment, presenting hazards to health   . Non-insulin treated type 2 diabetes mellitus ([place]) 09/26/2017  . Persistent atrial fibrillation   . Sleep apnea suspected 09/30/2017    Past Surgical History:  Procedure Laterality Date  . APPENDECTOMY  1971  . CARDIOVERSION N/A 09/28/2017   Procedure: CARDIOVERSION;  Surgeon: Sanda Klein, MD;  Location: [facility] ENDOSCOPY;  Service: Cardiovascular;  Laterality: N/A;  . TEE WITHOUT CARDIOVERSION N/A 09/28/2017   Procedure: TRANSESOPHAGEAL ECHOCARDIOGRAM (TEE);  Surgeon: Sanda Klein, MD;  Location: [facility] ENDOSCOPY;  Service: Cardiovascular;  Laterality: N/A;     Medications: No outpatient medications have been marked as taking for the 03/04/19 encounter (Appointment) with Burtis Junes, NP.     Allergies: No Known Allergies  Social History: The patient  reports that he quit smoking about a year ago. His smoking use included cigarettes. He quit after 15.00 years of use. He has never used smokeless tobacco. He reports current alcohol use of about 3.0 standard drinks of alcohol per week. He reports that he does not use drugs.   Family History: The patient's ***family history includes Hypertension in his mother.   Review of Systems: Please see the history of present illness.   All other systems are reviewed and negative.   Physical Exam: VS:  There were no vitals taken for this visit. Marland Kitchen  BMI There is no height or weight on  file to calculate BMI.  Wt Readings from Last 3 Encounters:  10/30/18 189 lb (85.7 kg)  10/20/18 192 lb 14.4 oz (87.5 kg)  09/30/17 190 lb 14.4 oz (86.6 kg)    General: Pleasant. Well developed, well nourished and in no acute distress.   HEENT: Normal.  Neck: Supple, no JVD, carotid bruits, or masses noted.  Cardiac: ***Regular rate and rhythm. No murmurs, rubs, or gallops. No edema.  Respiratory:  Lungs are clear to auscultation bilaterally with normal work of breathing.  GI: Soft  and nontender.  MS: No deformity or atrophy. Gait and ROM intact.  Skin: Warm and dry. Color is normal.  Neuro:  Strength and sensation are intact and no gross focal deficits noted.  Psych: Alert, appropriate and with normal affect.   LABORATORY DATA:  EKG:  EKG {ACTION; IS/IS [id number]:[id number]} ordered today. This demonstrates ***.  Lab Results  Component Value Date   WBC 7.1 11/16/2018   HGB 18.1 (H) 11/16/2018   HCT 53.1 (H) 11/16/2018   PLT 188 11/16/2018   GLUCOSE 262 (H) 11/16/2018   CHOL 215 (H) 10/18/2018   TRIG 103 10/18/2018   HDL 33 (L) 10/18/2018   LDLCALC 161 (H) 10/18/2018   ALT 44 10/18/2018   AST 26 10/18/2018   NA 135 11/16/2018   K 3.9 11/16/2018   CL 96 11/16/2018   CREATININE 1.05 11/16/2018   BUN 12 11/16/2018   CO2 24 11/16/2018   TSH 2.006 10/17/2018   INR 1.13 09/26/2017   HGBA1C 9.9 (H) 10/17/2018       BNP (last 3 results) Recent Labs    10/17/18 0109  BNP 419.1*    ProBNP (last 3 results) No results for input(s): PROBNP in the last 8760 hours.   Other Studies Reviewed Today:  ECHO IMPRESSIONS 10/2018  1. The left ventricle has moderately reduced systolic function, with an ejection fraction of 35-40%. The cavity size was normal. There is moderate concentric left ventricular hypertrophy. Left ventricular diastolic function could not be evaluated  secondary to atrial fibrillation. 2. The right ventricle has moderately reduced systolic function. The cavity was normal. There is no increase in right ventricular wall thickness. Right ventricular systolic pressure is mildly elevated. 3. Mitral valve regurgitation is mild to moderate by color flow Doppler. 4. The tricuspid valve is grossly normal. 5. No stenosis of the aortic valve. 6. The inferior vena cava was dilated in size with <50% respiratory variability. 7. The interatrial septum was not well visualized.  Assessment/Plan:  1. Persistent AF - prior failed cardioversion -  was placed on Sotalol - failed to follow up for repeat attempt at cardioversion last year due to significant financial concerns - would most likely need AAD therapy again - for now, trying to control his rate - Coreg is increased today.   2. Chronic anticoagulation - on Eliquis - will try to get patient assistance arranged - no active problems noted.   3. Chronic systolic HF - probably tachycardia mediated or from uncontrolled HTN. Cannot exclude CAD or viral etiology. No excessive alcohol history. Noted that the plan outlined was to control heart rate and blood pressure and then repeat echocardiogram in 3 months. If LV function remains decreased would need ischemia evaluation at that time. He looks good clinically -NYHA I - will cut Lasix back to just 40 mg a day. Coreg increased to 25 mg BID. Probably stop potassium but will check lab first.  Needs patient assistance for Entresto - could change back  to ARB (sounds like on Losartan in the past). Lab today.   4. Non compliance - had significant financial issues - now back employed but still no health insurance. He does seem quite motivated. Needs to get back to work - letter given today.   5. HTN - BP looks ok today - see #3.   6. DM - per PCP - poorly controlled by most recent A1C - he is trying to establish at [facility].  Marland Kitchen COVID-19 Education: The signs and symptoms of COVID-19 were discussed with the patient and how to seek care for testing (follow up with PCP or arrange E-visit).  The importance of social distancing, staying at home, hand hygiene and wearing a mask when out in public were discussed today.  Current medicines are reviewed with the patient today.  The patient does not have concerns regarding medicines other than what has been noted above.  The following changes have been made:  See above.  Labs/ tests ordered today include:   No orders of the defined types were placed in this encounter.    Disposition:   FU  with *** in {gen number 4-65:[id number]} {Days to years:10300}.   Patient is agreeable to this plan and will call if any problems develop in the interim.   SignedNorma Fredrickson, NP  02/26/2019 9:52 PM  [facility] [address] [place], [place]  [address] Phone: [phone] Fax: [phone]

## 2019-03-04 ENCOUNTER — Ambulatory Visit: Payer: Self-pay | Admitting: Nurse Practitioner

## 2019-03-04 ENCOUNTER — Ambulatory Visit: Payer: Self-pay | Admitting: Pharmacist

## 2019-04-02 MED FILL — CARVEDILOL 25 MG TABLET: 25 | 30 days supply | Qty: 60 | Fill #3

## 2019-04-02 MED FILL — FUROSEMIDE 40 MG TAB: 40 | 30 days supply | Qty: 30 | Fill #3

## 2019-04-02 MED FILL — SPIRONOLACTONE 25 MG TABLET: 25 | 30 days supply | Qty: 15 | Fill #3

## 2019-04-02 MED FILL — ATORVASTATIN CALCIUM 20 MG: 20 | 30 days supply | Qty: 30 | Fill #3

## 2019-04-02 MED FILL — DIGITEK 125 MCG TABLET: 125 | 30 days supply | Qty: 30 | Fill #3

## 2019-04-10 MED FILL — LEVEMIR FLEXTOUCH 100 UNITS: 100 | 21 days supply | Qty: 3 | Fill #2

## 2019-04-18 MED FILL — NOVOLOG FLEXPEN SYRINGE: 100 | 25 days supply | Qty: 3 | Fill #2

## 2019-05-02 MED FILL — FUROSEMIDE 40 MG TAB: 40 | 30 days supply | Qty: 30 | Fill #4

## 2019-05-02 MED FILL — SPIRONOLACTONE 25 MG TABLET: 25 | 30 days supply | Qty: 15 | Fill #4

## 2019-05-02 MED FILL — ATORVASTATIN CALCIUM 20 MG: 20 | 30 days supply | Qty: 30 | Fill #4

## 2019-05-02 MED FILL — DIGITEK 125 MCG TABLET: 125 | 30 days supply | Qty: 30 | Fill #4

## 2019-05-02 MED FILL — CARVEDILOL 25 MG TABLET: 25 | 30 days supply | Qty: 60 | Fill #4

## 2019-05-14 MED FILL — CARVEDILOL 25 MG TABLET: 25 | 30 days supply | Qty: 60 | Fill #4

## 2019-05-14 MED FILL — DIGITEK 125 MCG TABLET: 125 | 30 days supply | Qty: 30 | Fill #4

## 2019-05-14 MED FILL — BENZONATATE 100 MG CAPS: 100 | 5 days supply | Qty: 30 | Fill #0

## 2019-05-14 MED FILL — !LEVEMIR FLEXTOUCH 100 UNIT: 100 | 21 days supply | Qty: 3 | Fill #2

## 2019-05-14 MED FILL — ATORVASTATIN CALCIUM 20 MG: 20 | 30 days supply | Qty: 30 | Fill #4

## 2019-05-14 MED FILL — FUROSEMIDE 40 MG TAB: 40 | 30 days supply | Qty: 30 | Fill #4

## 2019-05-14 MED FILL — SPIRONOLACTONE 25 MG TABLET: 25 | 30 days supply | Qty: 15 | Fill #4

## 2019-05-14 MED FILL — AMOX-CLAV 875-125 MG TABLET: 875-125 | 7 days supply | Qty: 14 | Fill #0

## 2019-06-11 MED FILL — ATORVASTATIN CALCIUM 20 MG: 20 | 30 days supply | Qty: 30 | Fill #5

## 2019-06-11 MED FILL — SPIRONOLACTONE 25 MG TABLET: 25 | 30 days supply | Qty: 15 | Fill #5

## 2019-06-11 MED FILL — CARVEDILOL 25 MG TABLET: 25 | 30 days supply | Qty: 60 | Fill #5

## 2019-06-11 MED FILL — FUROSEMIDE 40 MG TAB: 40 | 30 days supply | Qty: 30 | Fill #5

## 2019-06-11 MED FILL — DIGITEK 125 MCG TABLET: 125 | 30 days supply | Qty: 30 | Fill #4

## 2019-07-16 MED FILL — ?DIGITEK 125 MCG TABLET: 125 | 30 days supply | Qty: 30 | Fill #5

## 2019-07-16 MED FILL — ?CARVEDILOL 25 MG TABLET: 25 | 30 days supply | Qty: 60 | Fill #6

## 2019-07-16 MED FILL — ?ATORVASTATIN 20 MG TABLET: 20 | 30 days supply | Qty: 30 | Fill #6

## 2019-07-16 MED FILL — ?SPIRONOLACTONE 25 MG TABLE: 25 | 30 days supply | Qty: 15 | Fill #6

## 2019-07-16 MED FILL — ?FUROSEMIDE 40 MG TABLET: 40 | 30 days supply | Qty: 30 | Fill #6

## 2019-08-26 ENCOUNTER — Encounter (HOSPITAL_COMMUNITY): Payer: Self-pay

## 2019-08-26 ENCOUNTER — Inpatient Hospital Stay (HOSPITAL_COMMUNITY)
Admission: EM | Admit: 2019-08-26 | Discharge: 2019-08-30 | DRG: 906 | Disposition: A | Discharge status: Home / Self Care | Payer: Self-pay | Attending: Family Medicine | Admitting: Family Medicine

## 2019-08-26 ENCOUNTER — Other Ambulatory Visit: Payer: Self-pay

## 2019-08-26 ENCOUNTER — Inpatient Hospital Stay (HOSPITAL_COMMUNITY): Payer: Self-pay

## 2019-08-26 DIAGNOSIS — I5022 Chronic systolic (congestive) heart failure: Secondary | ICD-10-CM | POA: Diagnosis present

## 2019-08-26 DIAGNOSIS — M869 Osteomyelitis, unspecified: Secondary | ICD-10-CM | POA: Diagnosis present

## 2019-08-26 DIAGNOSIS — I429 Cardiomyopathy, unspecified: Secondary | ICD-10-CM | POA: Diagnosis present

## 2019-08-26 DIAGNOSIS — T63331A Toxic effect of venom of brown recluse spider, accidental (unintentional), initial encounter: Principal | ICD-10-CM | POA: Diagnosis present

## 2019-08-26 DIAGNOSIS — I110 Hypertensive heart disease with heart failure: Secondary | ICD-10-CM | POA: Diagnosis present

## 2019-08-26 DIAGNOSIS — Z87891 Personal history of nicotine dependence: Secondary | ICD-10-CM

## 2019-08-26 DIAGNOSIS — L0390 Cellulitis, unspecified: Secondary | ICD-10-CM

## 2019-08-26 DIAGNOSIS — I10 Essential (primary) hypertension: Secondary | ICD-10-CM | POA: Diagnosis present

## 2019-08-26 DIAGNOSIS — Z794 Long term (current) use of insulin: Secondary | ICD-10-CM

## 2019-08-26 DIAGNOSIS — L03114 Cellulitis of left upper limb: Secondary | ICD-10-CM | POA: Diagnosis present

## 2019-08-26 DIAGNOSIS — Z8249 Family history of ischemic heart disease and other diseases of the circulatory system: Secondary | ICD-10-CM

## 2019-08-26 DIAGNOSIS — E871 Hypo-osmolality and hyponatremia: Secondary | ICD-10-CM | POA: Diagnosis not present

## 2019-08-26 DIAGNOSIS — E119 Type 2 diabetes mellitus without complications: Secondary | ICD-10-CM

## 2019-08-26 DIAGNOSIS — Z20822 Contact with and (suspected) exposure to covid-19: Secondary | ICD-10-CM | POA: Diagnosis present

## 2019-08-26 DIAGNOSIS — Z7901 Long term (current) use of anticoagulants: Secondary | ICD-10-CM

## 2019-08-26 DIAGNOSIS — E1169 Type 2 diabetes mellitus with other specified complication: Secondary | ICD-10-CM | POA: Diagnosis present

## 2019-08-26 DIAGNOSIS — G4730 Sleep apnea, unspecified: Secondary | ICD-10-CM | POA: Diagnosis present

## 2019-08-26 DIAGNOSIS — I480 Paroxysmal atrial fibrillation: Secondary | ICD-10-CM

## 2019-08-26 DIAGNOSIS — L02512 Cutaneous abscess of left hand: Secondary | ICD-10-CM | POA: Diagnosis present

## 2019-08-26 DIAGNOSIS — I4819 Other persistent atrial fibrillation: Secondary | ICD-10-CM | POA: Diagnosis present

## 2019-08-26 LAB — CBC WITH DIFFERENTIAL/PLATELET
Abs Immature Granulocytes: 0.06 10*3/uL (ref 0.00–0.07)
Basophils Absolute: 0.1 10*3/uL (ref 0.0–0.1)
Basophils Relative: 1 %
Eosinophils Absolute: 0.3 10*3/uL (ref 0.0–0.5)
Eosinophils Relative: 4 %
HCT: 42 % (ref 39.0–52.0)
Hemoglobin: 14.6 g/dL (ref 13.0–17.0)
Immature Granulocytes: 1 %
Lymphocytes Relative: 15 %
Lymphs Abs: 1.4 10*3/uL (ref 0.7–4.0)
MCH: 32.8 pg (ref 26.0–34.0)
MCHC: 34.8 g/dL (ref 30.0–36.0)
MCV: 94.4 fL (ref 80.0–100.0)
Monocytes Absolute: 1.1 10*3/uL — ABNORMAL HIGH (ref 0.1–1.0)
Monocytes Relative: 12 %
Neutro Abs: 6 10*3/uL (ref 1.7–7.7)
Neutrophils Relative %: 67 %
Platelets: 219 10*3/uL (ref 150–400)
RBC: 4.45 MIL/uL (ref 4.22–5.81)
RDW: 12.2 % (ref 11.5–15.5)
WBC: 8.9 10*3/uL (ref 4.0–10.5)
nRBC: 0 % (ref 0.0–0.2)

## 2019-08-26 LAB — HIV ANTIBODY (ROUTINE TESTING W REFLEX): HIV Screen 4th Generation wRfx: NONREACTIVE

## 2019-08-26 LAB — BASIC METABOLIC PANEL
Anion gap: 11 (ref 5–15)
BUN: 21 mg/dL — ABNORMAL HIGH (ref 6–20)
CO2: 25 mmol/L (ref 22–32)
Calcium: 9.4 mg/dL (ref 8.9–10.3)
Chloride: 98 mmol/L (ref 98–111)
Creatinine, Ser: 1.2 mg/dL (ref 0.61–1.24)
GFR calc Af Amer: >60 mL/min (ref >60)
GFR calc non Af Amer: >60 mL/min (ref >60)
Glucose, Bld: 373 mg/dL — ABNORMAL HIGH (ref 70–99)
Potassium: 3.9 mmol/L (ref 3.5–5.1)
Sodium: 134 mmol/L — ABNORMAL LOW (ref 135–145)

## 2019-08-26 LAB — HEMOGLOBIN A1C
Hgb A1c MFr Bld: 9.7 % — ABNORMAL HIGH (ref 4.8–5.6)
Mean Plasma Glucose: 231.69 mg/dL

## 2019-08-26 LAB — GLUCOSE, CAPILLARY: Glucose-Capillary: 250 mg/dL — ABNORMAL HIGH (ref 70–99)

## 2019-08-26 LAB — LACTIC ACID, PLASMA
Lactic Acid, Venous: 2.8 mmol/L (ref 0.5–1.9)
Lactic Acid, Venous: 1.7 mmol/L (ref 0.5–1.9)
Lactic Acid, Venous: 1.9 mmol/L (ref 0.5–1.9)

## 2019-08-26 LAB — SARS CORONAVIRUS 2 BY RT PCR (HOSPITAL ORDER, PERFORMED IN ~~LOC~~ HOSPITAL LAB): SARS Coronavirus 2: NEGATIVE

## 2019-08-26 IMAGING — DX DG HAND COMPLETE 3+V*L*
3 series · 3 of 3 positions shown · non-contrast
Comparison: None.

CLINICAL DATA: Possible bug bite.

EXAM:
LEFT HAND - COMPLETE 3+ VIEW

[hand ap]
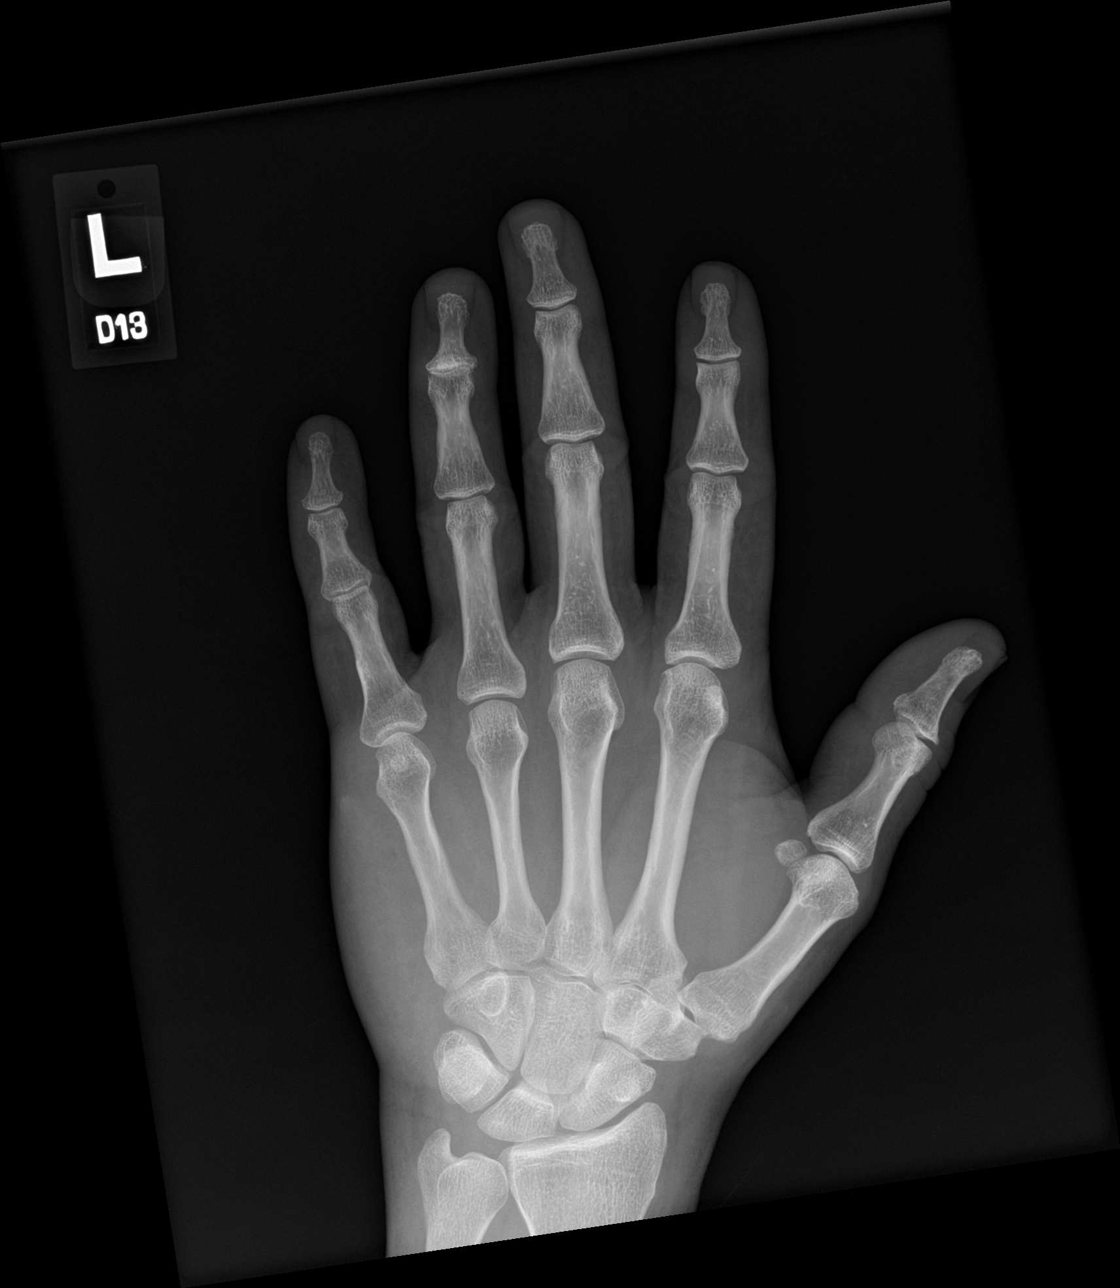

[hand obl]
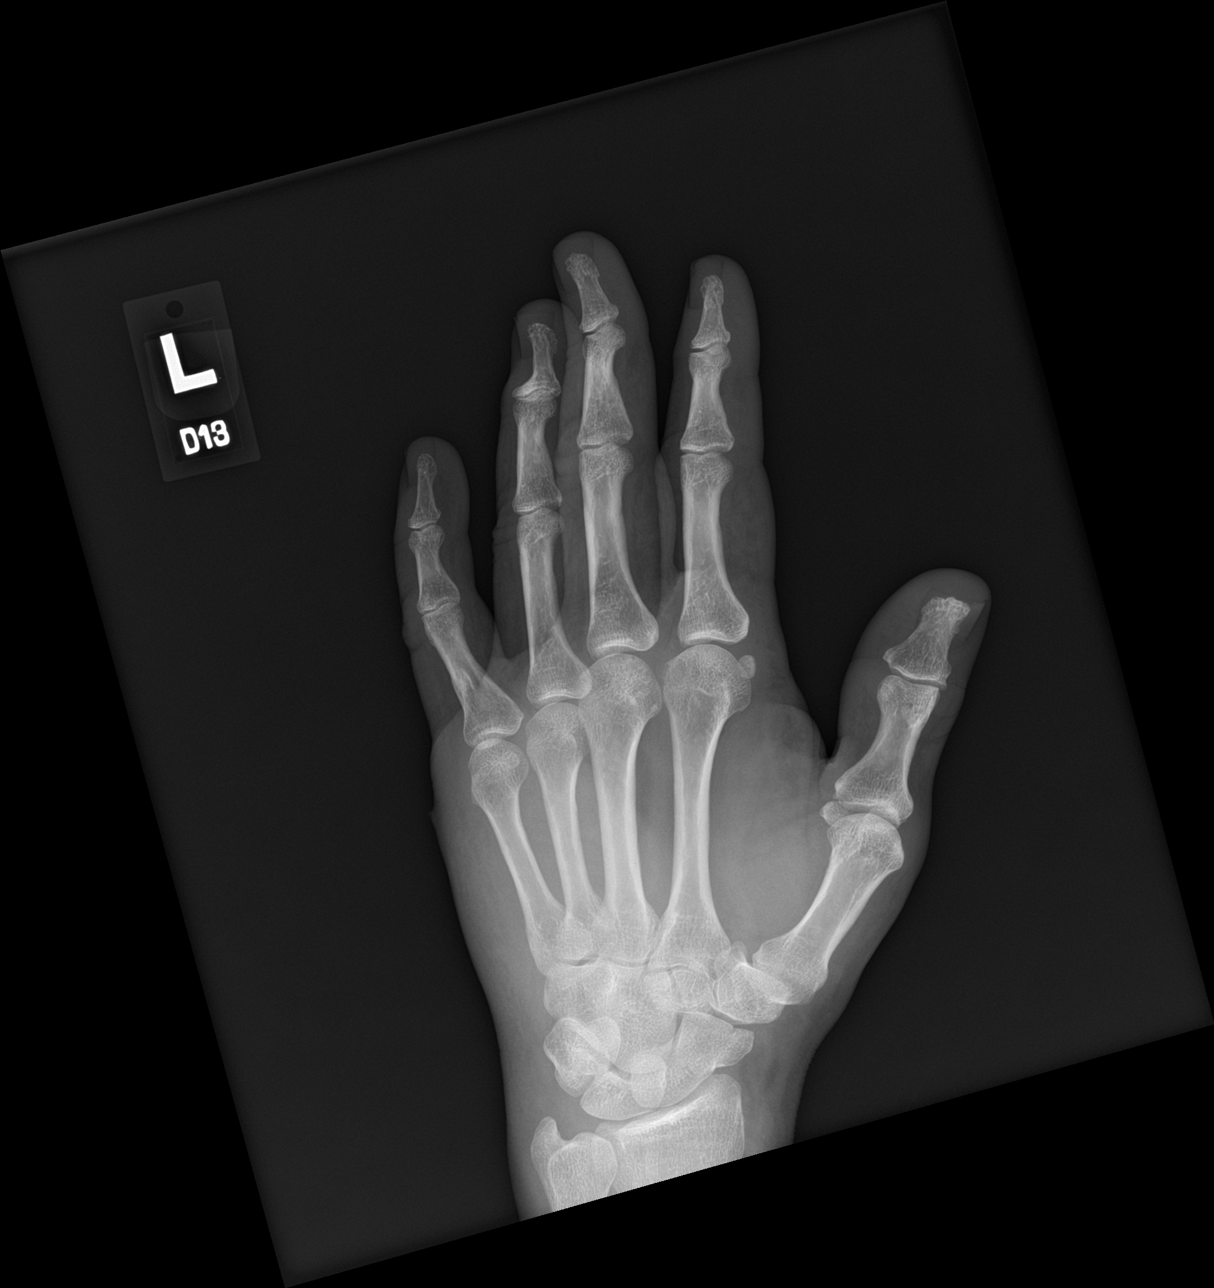

[hand lat]
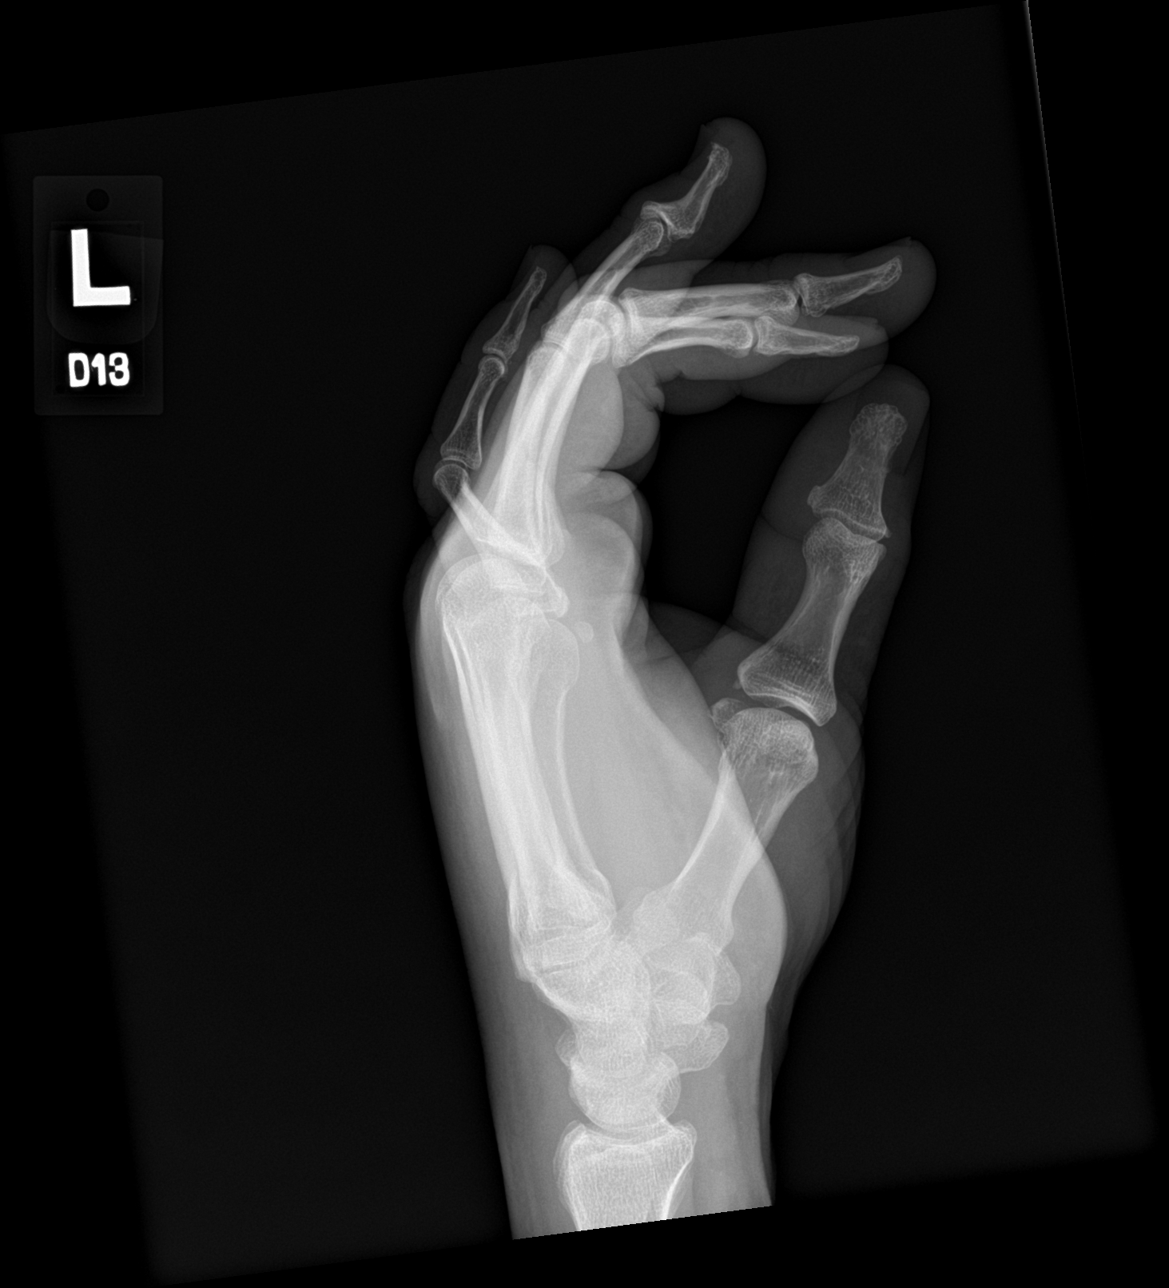

[3 of 3 positions shown; findings below may reference images not displayed]

FINDINGS: There is no evidence of fracture or dislocation. There is no
evidence of arthropathy or other focal bone abnormality. Mild
diffuse dorsal soft tissue swelling is seen.
IMPRESSION: Mild diffuse dorsal soft tissue swelling without evidence of acute
osseous abnormality.

## 2019-08-26 MED ORDER — IBUPROFEN 400 MG PO TABS
600.0000 mg | ORAL_TABLET | ORAL | Status: DC | PRN
  Administered 2019-08-26 – 2019-08-29 (×6): 600 mg via ORAL
  Filled 2019-08-26 (×7): qty 1

## 2019-08-26 MED ORDER — MORPHINE SULFATE (PF) 4 MG/ML IV SOLN
4.0000 mg | Freq: Once | INTRAVENOUS | Status: AC
  Administered 2019-08-26: 4 mg via INTRAVENOUS
  Filled 2019-08-26: qty 1

## 2019-08-26 MED ORDER — INSULIN DETEMIR 100 UNIT/ML ~~LOC~~ SOLN
10.0000 [IU] | Freq: Every day | SUBCUTANEOUS | Status: DC
  Administered 2019-08-27: 10 [IU] via SUBCUTANEOUS
  Filled 2019-08-26 (×2): qty 0.1

## 2019-08-26 MED ORDER — CARVEDILOL 25 MG PO TABS
25.0000 mg | ORAL_TABLET | Freq: Two times a day (BID) | ORAL | Status: DC
  Administered 2019-08-26 – 2019-08-30 (×8): 25 mg via ORAL
  Filled 2019-08-26 (×8): qty 1

## 2019-08-26 MED ORDER — MORPHINE SULFATE (PF) 2 MG/ML IV SOLN
1.0000 mg | INTRAVENOUS | Status: DC | PRN
  Administered 2019-08-27 – 2019-08-28 (×4): 1 mg via INTRAVENOUS
  Filled 2019-08-26 (×4): qty 1

## 2019-08-26 MED ORDER — FUROSEMIDE 40 MG PO TABS
40.0000 mg | ORAL_TABLET | Freq: Every day | ORAL | Status: DC
  Administered 2019-08-27 – 2019-08-30 (×4): 40 mg via ORAL
  Filled 2019-08-26 (×4): qty 1

## 2019-08-26 MED ORDER — VANCOMYCIN HCL IN DEXTROSE 1-5 GM/200ML-% IV SOLN
1000.0000 mg | Freq: Once | INTRAVENOUS | Status: AC
  Administered 2019-08-26: 1000 mg via INTRAVENOUS
  Filled 2019-08-26: qty 200

## 2019-08-26 MED ORDER — INSULIN ASPART 100 UNIT/ML ~~LOC~~ SOLN
0.0000 [IU] | Freq: Every day | SUBCUTANEOUS | Status: DC
  Administered 2019-08-26 – 2019-08-27 (×2): 2 [IU] via SUBCUTANEOUS
  Administered 2019-08-28 – 2019-08-29 (×2): 3 [IU] via SUBCUTANEOUS
  Filled 2019-08-26: qty 0.05

## 2019-08-26 MED ORDER — LACTATED RINGERS IV BOLUS
500.0000 mL | Freq: Once | INTRAVENOUS | Status: AC
  Administered 2019-08-26: 500 mL via INTRAVENOUS

## 2019-08-26 MED ORDER — SPIRONOLACTONE 12.5 MG HALF TABLET
12.5000 mg | ORAL_TABLET | Freq: Every day | ORAL | Status: DC
  Administered 2019-08-27 – 2019-08-30 (×4): 12.5 mg via ORAL
  Filled 2019-08-26 (×4): qty 1

## 2019-08-26 MED ORDER — OXYCODONE-ACETAMINOPHEN 5-325 MG PO TABS
1.0000 | ORAL_TABLET | Freq: Four times a day (QID) | ORAL | Status: DC
  Administered 2019-08-27 – 2019-08-29 (×10): 1 via ORAL
  Filled 2019-08-26 (×10): qty 1

## 2019-08-26 MED ORDER — MORPHINE SULFATE (PF) 4 MG/ML IV SOLN
6.0000 mg | Freq: Once | INTRAVENOUS | Status: AC
  Administered 2019-08-26: 6 mg via INTRAVENOUS
  Filled 2019-08-26: qty 2

## 2019-08-26 MED ORDER — VANCOMYCIN HCL 750 MG/150ML IV SOLN
750.0000 mg | Freq: Once | INTRAVENOUS | Status: AC
  Administered 2019-08-26: 750 mg via INTRAVENOUS
  Filled 2019-08-26: qty 150

## 2019-08-26 MED ORDER — SACUBITRIL-VALSARTAN 24-26 MG PO TABS
1.0000 | ORAL_TABLET | Freq: Two times a day (BID) | ORAL | Status: DC
  Administered 2019-08-26 – 2019-08-30 (×8): 1 via ORAL
  Filled 2019-08-26 (×8): qty 1

## 2019-08-26 MED ORDER — INSULIN ASPART 100 UNIT/ML ~~LOC~~ SOLN
4.0000 [IU] | Freq: Three times a day (TID) | SUBCUTANEOUS | Status: DC
  Administered 2019-08-27: 4 [IU] via SUBCUTANEOUS
  Filled 2019-08-26: qty 0.04

## 2019-08-26 MED ORDER — SODIUM CHLORIDE 0.9 % IV SOLN
INTRAVENOUS | Status: DC
  Administered 2019-08-26: 10 mL via INTRAVENOUS

## 2019-08-26 MED ORDER — INSULIN ASPART 100 UNIT/ML ~~LOC~~ SOLN
0.0000 [IU] | Freq: Three times a day (TID) | SUBCUTANEOUS | Status: DC
  Administered 2019-08-27: 5 [IU] via SUBCUTANEOUS
  Administered 2019-08-27: 8 [IU] via SUBCUTANEOUS
  Administered 2019-08-27: 5 [IU] via SUBCUTANEOUS
  Administered 2019-08-28: 11 [IU] via SUBCUTANEOUS
  Administered 2019-08-28: 5 [IU] via SUBCUTANEOUS
  Administered 2019-08-28: 8 [IU] via SUBCUTANEOUS
  Administered 2019-08-29: 2 [IU] via SUBCUTANEOUS
  Administered 2019-08-29: 11 [IU] via SUBCUTANEOUS
  Administered 2019-08-29: 5 [IU] via SUBCUTANEOUS
  Administered 2019-08-30: 3 [IU] via SUBCUTANEOUS
  Administered 2019-08-30: 5 [IU] via SUBCUTANEOUS
  Administered 2019-08-30: 3 [IU] via SUBCUTANEOUS
  Filled 2019-08-26: qty 0.15

## 2019-08-26 MED ORDER — TETANUS-DIPHTHERIA TOXOIDS TD 5-2 LFU IM INJ
0.5000 mL | INJECTION | Freq: Once | INTRAMUSCULAR | Status: AC
  Administered 2019-08-26: 0.5 mL via INTRAMUSCULAR
  Filled 2019-08-26: qty 0.5

## 2019-08-26 MED ORDER — APIXABAN 5 MG PO TABS
5.0000 mg | ORAL_TABLET | Freq: Two times a day (BID) | ORAL | Status: DC
  Administered 2019-08-26 – 2019-08-28 (×5): 5 mg via ORAL
  Filled 2019-08-26 (×5): qty 1

## 2019-08-26 MED ORDER — VANCOMYCIN HCL IN DEXTROSE 1-5 GM/200ML-% IV SOLN
1000.0000 mg | Freq: Two times a day (BID) | INTRAVENOUS | Status: DC
  Administered 2019-08-27 – 2019-08-29 (×5): 1000 mg via INTRAVENOUS
  Filled 2019-08-26 (×7): qty 200

## 2019-08-26 MED ORDER — ATORVASTATIN CALCIUM 20 MG PO TABS
20.0000 mg | ORAL_TABLET | Freq: Every day | ORAL | Status: DC
  Administered 2019-08-26 – 2019-08-30 (×5): 20 mg via ORAL
  Filled 2019-08-26 (×5): qty 1

## 2019-08-26 MED ORDER — SODIUM CHLORIDE 0.9 % IV SOLN
1.0000 g | INTRAVENOUS | Status: DC
  Administered 2019-08-26 – 2019-08-28 (×3): 1 g via INTRAVENOUS
  Filled 2019-08-26: qty 10
  Filled 2019-08-26 (×2): qty 1
  Filled 2019-08-26: qty 10

## 2019-08-26 MED ORDER — KETOROLAC TROMETHAMINE 15 MG/ML IJ SOLN
15.0000 mg | Freq: Once | INTRAMUSCULAR | Status: AC
  Administered 2019-08-27: 15 mg via INTRAVENOUS
  Filled 2019-08-26: qty 1

## 2019-08-26 MED ORDER — MORPHINE SULFATE (PF) 2 MG/ML IV SOLN
1.0000 mg | INTRAVENOUS | Status: DC | PRN
  Administered 2019-08-26 (×2): 1 mg via INTRAVENOUS
  Filled 2019-08-26 (×2): qty 1

## 2019-08-26 MED ORDER — DIGOXIN 125 MCG PO TABS
0.1250 mg | ORAL_TABLET | Freq: Every day | ORAL | Status: DC
  Administered 2019-08-26 – 2019-08-30 (×5): 0.125 mg via ORAL
  Filled 2019-08-26 (×5): qty 1

## 2019-08-26 NOTE — ED Notes (Signed)
Transport called for pt transfer to the floor  

## 2019-08-26 NOTE — ED Notes (Signed)
Date and time results received: 08/26/19 6:57 PM  (use smartphrase ".now" to insert current time)  Test: Lactic Acid Critical Value: 2.8  Name of Provider Notified: Dr.Tu  Orders Received? Or Actions Taken?: Fluids to be ordered.

## 2019-08-26 NOTE — ED Notes (Signed)
Called report for prior RN for pt transfer to the floor. Receiving nurse voiced concerns about pts blood pressure. This nurse relayed information from report  That pt has not had his home b/p medications today and is also in pain. All other vitals are stable. Charge notified, pt bp rechecked and is floor appropriate at 159/92 , temp 98.2, hr 71, rr 20, spo2 100 % on room air. Pt was given morphine for pain by charge.

## 2019-08-26 NOTE — H&P (Signed)
History and Physical    David [name] DPO:[id number] DOB: 1961/03/13 DOA: 08/26/2019  PCP: Claiborne Rigg, NP  Patient coming from: Home  I have personally briefly reviewed patient's old medical records in [facility]  Chief Complaint: Left hand pain, edema and erythema  HPI: David [name] is a 59 y.o. male with medical history significant for Hx of atrial fibrillation on Eliquis, cardiomyopathy with EF of 35-40% (echo 10/2018), insulin-dependent Type 2 diabetes who presents with left hand cellulitis.   Patient noticed swelling and a white head on his left posterior hand yesterday.  He was at a cookout prior to that and thinks he might have been bitten by a bug.  He then poured alcohol on it and try to stick the white head with a safety pin in order to drain it.  However there was no pus drainage and he only had bleeding. This morning woke up with increase throbbing pain, edema and spreading erythema up his posterior forearm. Hurts to bend his 4th and 5th digit as well as his wrist.  Denies any fever, chills. No nausea or vomiting.   He was afebrile, mildly hypertensive on room air.  No leukocytosis noted. Glucose of 373 but BMP otherwise unremarkable.  He was started on Vancomycin and given 4mg  of morphine and Tdap vaccination in the ED.    Review of Systems: Constitutional: No Weight Change, No Fever ENT/Mouth: No sore throat, No Rhinorrhea Eyes: No Eye Pain, No Vision Changes Cardiovascular: No Chest Pain, no SOB Respiratory: No Cough, No Sputum Gastrointestinal: No Nausea, No Vomiting, No Diarrhea Genitourinary: no Urinary Incontinence Musculoskeletal: + Arthralgias, + Myalgias Skin: No Skin Lesions, No Pruritus, Neuro: no Weakness, No Numbness Psych: No Anxiety/Panic, No Depression, no decrease appetite Heme/Lymph: No Bruising, No Bleeding  Past Medical History:  Diagnosis Date  . Cardiomyopathy (HCC)    a. EF 45% in 2019.  2020 Chronic anticoagulation 09/30/2017  .  Diabetes mellitus type 2 in nonobese (HCC)   . Does not have health insurance   . Essential hypertension 09/26/2017  . Financial difficulties   . H/O noncompliance with medical treatment, presenting hazards to health   . Non-insulin treated type 2 diabetes mellitus (HCC) 09/26/2017  . Persistent atrial fibrillation (HCC)   . Sleep apnea suspected 09/30/2017    Past Surgical History:  Procedure Laterality Date  . APPENDECTOMY  1971  . CARDIOVERSION N/A 09/28/2017   Procedure: CARDIOVERSION;  Surgeon: 09/30/2017, MD;  Location: [facility] ENDOSCOPY;  Service: Cardiovascular;  Laterality: N/A;  . TEE WITHOUT CARDIOVERSION N/A 09/28/2017   Procedure: TRANSESOPHAGEAL ECHOCARDIOGRAM (TEE);  Surgeon: 09/30/2017, MD;  Location: [facility] ENDOSCOPY;  Service: Cardiovascular;  Laterality: N/A;     reports that he quit smoking about 17 months ago. His smoking use included cigarettes. He quit after 15.00 years of use. He has never used smokeless tobacco. He reports current alcohol use of about 3.0 standard drinks of alcohol per week. He reports that he does not use drugs.  No Known Allergies  Family History  Problem Relation Age of Onset  . Hypertension Mother      Prior to Admission medications   Medication Sig Start Date End Date Taking? Authorizing Provider  apixaban (ELIQUIS) 5 MG TABS tablet Take 1 tablet (5 mg total) by mouth 2 (two) times daily. 10/19/18  Yes Hongalgi, 10/21/18, MD  atorvastatin (LIPITOR) 20 MG tablet Take 1 tablet (20 mg total) by mouth daily. 11/02/18  Yes 11/04/18, NP  carvedilol (  COREG) 25 MG tablet Take 1 tablet (25 mg total) by mouth 2 (two) times daily. 10/30/18 08/26/19 Yes Rosalio Macadamia, NP  digoxin (LANOXIN) 0.125 MG tablet Take 1 tablet (0.125 mg total) by mouth daily. 10/30/18  Yes Rosalio Macadamia, NP  furosemide (LASIX) 40 MG tablet Take 1 tablet (40 mg total) by mouth daily. 10/30/18  Yes Rosalio Macadamia, NP  ibuprofen (ADVIL) 200 MG tablet Take 400 mg by  mouth every 6 (six) hours as needed for moderate pain.   Yes [provider]  insulin aspart (NOVOLOG) 100 UNIT/ML FlexPen Inject 4 Units into the skin 3 (three) times daily with meals. Patient taking differently: Inject 6 Units into the skin in the morning, at noon, in the evening, and at bedtime.  10/19/18  Yes Hongalgi, Maximino Greenland, MD  sacubitril-valsartan (ENTRESTO) 24-26 MG Take 1 tablet by mouth 2 (two) times daily. 10/30/18  Yes Rosalio Macadamia, NP  sodium chloride (OCEAN) 0.65 % SOLN nasal spray Place 1 spray into both nostrils as needed for congestion.   Yes [provider]  spironolactone (ALDACTONE) 25 MG tablet Take 0.5 tablets (12.5 mg total) by mouth daily. 10/30/18  Yes Rosalio Macadamia, NP  guaiFENesin-dextromethorphan (ROBITUSSIN DM) 100-10 MG/5ML syrup Take 5 mLs by mouth every 4 (four) hours as needed for cough. Patient not taking: Reported on 08/26/2019 10/20/18   Elease Etienne, MD  Insulin Detemir (LEVEMIR) 100 UNIT/ML Pen Inject 14 Units into the skin at bedtime. Patient not taking: Reported on 08/26/2019 10/19/18   Elease Etienne, MD  Insulin Pen Needle 32G X 4 MM MISC Use as directed 3 times a day with meals and at bedtime. 10/19/18   Elease Etienne, MD    Physical Exam: Vitals:   08/26/19 1539 08/26/19 1653 08/26/19 1723  BP: (!) 150/99 (!) 153/97 (!) 153/113  Pulse: 61 73 76  Resp: 18 16 19   Temp: 98.3 F (36.8 C)  98.5 F (36.9 C)  TempSrc: Oral  Oral  SpO2: 98% 96% 100%    Constitutional: NAD, calm, comfortable, non-toxic appearing male laying at 40 degree incline in bed Vitals:   08/26/19 1539 08/26/19 1653 08/26/19 1723  BP: (!) 150/99 (!) 153/97 (!) 153/113  Pulse: 61 73 76  Resp: 18 16 19   Temp: 98.3 F (36.8 C)  98.5 F (36.9 C)  TempSrc: Oral  Oral  SpO2: 98% 96% 100%   Eyes: PERRL, lids and conjunctivae normal ENMT: Mucous membranes are moist.  Neck: normal, supple Respiratory: clear to auscultation bilaterally, no  wheezing, no crackles. Normal respiratory effort on room air. No accessory muscle use.  Cardiovascular: Regular rate and rhythm, no murmurs / rubs / gallops. No extremity edema. 2+ pedal pulses.  Abdomen: no tenderness, no masses palpated.  Bowel sounds positive.  Musculoskeletal: no clubbing / cyanosis. No joint deformity upper and lower extremities. Large area of erythema and moderate edema to lateral aspect of posterior left hand with an area of scabbing, significant pain with light palpation but no abscess, crepitus or induration felt. Has pain of the MCP joint of the 4th and 5th digit on the extension surface with ROM. Also has pain with ROM on the extensor surface of the wrist. There is less pain with just palpation and no movement of those same joints.  There is also spreading erythema from posterior hand up the forearm      Skin: erythema and edema with area of scabbing on left posterior hand as  noted above.  Neurologic: CN 2-12 grossly intact. Sensation intact,  Strength 5/5 in all 4.  Psychiatric: Normal judgment and insight. Alert and oriented x 3. Normal mood.     Labs on Admission: I have personally reviewed following labs and imaging studies  CBC: Recent Labs  Lab 08/26/19 1634  WBC 8.9  NEUTROABS 6.0  HGB 14.6  HCT 42.0  MCV 94.4  PLT 154   Basic Metabolic Panel: Recent Labs  Lab 08/26/19 1634  NA 134*  K 3.9  CL 98  CO2 25  GLUCOSE 373*  BUN 21*  CREATININE 1.20  CALCIUM 9.4   GFR: CrCl cannot be calculated (Unknown ideal weight.). Liver Function Tests: No results for input(s): AST, ALT, ALKPHOS, BILITOT, PROT, ALBUMIN in the last 168 hours. No results for input(s): LIPASE, AMYLASE in the last 168 hours. No results for input(s): AMMONIA in the last 168 hours. Coagulation Profile: No results for input(s): INR, PROTIME in the last 168 hours. Cardiac Enzymes: No results for input(s): CKTOTAL, CKMB, CKMBINDEX, TROPONINI in the last 168 hours. BNP (last  3 results) No results for input(s): PROBNP in the last 8760 hours. HbA1C: No results for input(s): HGBA1C in the last 72 hours. CBG: No results for input(s): GLUCAP in the last 168 hours. Lipid Profile: No results for input(s): CHOL, HDL, LDLCALC, TRIG, CHOLHDL, LDLDIRECT in the last 72 hours. Thyroid Function Tests: No results for input(s): TSH, T4TOTAL, FREET4, T3FREE, THYROIDAB in the last 72 hours. Anemia Panel: No results for input(s): VITAMINB12, FOLATE, FERRITIN, TIBC, IRON, RETICCTPCT in the last 72 hours. Urine analysis: No results found for: COLORURINE, APPEARANCEUR, LABSPEC, PHURINE, GLUCOSEU, HGBUR, BILIRUBINUR, KETONESUR, PROTEINUR, UROBILINOGEN, NITRITE, LEUKOCYTESUR  Radiological Exams on Admission: No results found.    Assessment/Plan  Left hand cellulitis  Continue Vancomycin and add Rocephin  will obtain hand X-ray given pain with movement of MCP joint of 4th/5th digit and wrist joint Give 500cc bolus for elevated Lactate and will trend. No other signs of sepsis.   Insulin dependent Type 2 DM 10 units of Lantus qHS, 4 units TID with meals, moderate ssi  At home only takes 6 units of Novolog TID  Atrial fibrillation Rate controlled Continue Digoxin Continue Eliquis   Hypertension Continue all anti-hypertensives  Chronic systolic CHF EF of 00-86% in echo on 7/20 Appears euvolemic Continue Coreg, Lasix, Entresto, Spirolactone  DVT prophylaxis:Eliquis  Code Status: Full Family Communication: Plan discussed with patient at bedside  disposition Plan: Home with at least 2 midnight stays  Consults called:  Admission status: inpatient  Status is: Inpatient  Remains inpatient appropriate because:IV treatments appropriate due to intensity of illness or inability to take PO   Dispo: The patient is from: Home              Anticipated d/c is to: Home              Anticipated d/c date is: 3 days              Patient currently is not medically stable to  d/c.          Orene Desanctis DO [facility]   If 7PM-7AM, please contact night-coverage [email or web address]   08/26/2019, 6:41 PM

## 2019-08-26 NOTE — ED Provider Notes (Addendum)
[facility]-EMERGENCY DEPT Provider Note   CSN: [id number] Arrival date & time: 08/26/19  1528     History Chief Complaint  Patient presents with  . Hand Pain    David [name] is a 59 y.o. male.  59 year old male presents with left-sided hand swelling and erythema x3 4 hours.  Patient states that he awoke and noted he had pain and swelling to the lateral aspect of his left hand.  Had a pustule which he tried to drain and not much material came out.  Since that time he has had increased redness and pain with movement of his fourth and fifth digits.  He has noted some erythema going up his left arm.  Denies any fever or chills.  Does have a history of diabetes        Past Medical History:  Diagnosis Date  . Cardiomyopathy (HCC)    a. EF 45% in 2019.  Marland Kitchen Chronic anticoagulation 09/30/2017  . Diabetes mellitus type 2 in nonobese (HCC)   . Does not have health insurance   . Essential hypertension 09/26/2017  . Financial difficulties   . H/O noncompliance with medical treatment, presenting hazards to health   . Non-insulin treated type 2 diabetes mellitus (HCC) 09/26/2017  . Persistent atrial fibrillation (HCC)   . Sleep apnea suspected 09/30/2017    Patient Active Problem List   Diagnosis Date Noted  . Heart failure (HCC) 10/18/2018  . Transaminitis 10/17/2018  . Hyperbilirubinemia 10/17/2018  . Leukocytosis 10/17/2018  . Sinusitis 10/17/2018  . Chronic anticoagulation 09/30/2017  . Smoker 09/30/2017  . Cardiomyopathy (HCC) 09/30/2017  . Sleep apnea suspected 09/30/2017  . Essential hypertension 09/26/2017  . Non-insulin treated type 2 diabetes mellitus (HCC) 09/26/2017  . Atrial fibrillation with RVR (HCC) 09/26/2017    Past Surgical History:  Procedure Laterality Date  . APPENDECTOMY  1971  . CARDIOVERSION N/A 09/28/2017   Procedure: CARDIOVERSION;  Surgeon: Thurmon Fair, MD;  Location: [facility] ENDOSCOPY;  Service: Cardiovascular;  Laterality: N/A;    . TEE WITHOUT CARDIOVERSION N/A 09/28/2017   Procedure: TRANSESOPHAGEAL ECHOCARDIOGRAM (TEE);  Surgeon: Thurmon Fair, MD;  Location: [facility] ENDOSCOPY;  Service: Cardiovascular;  Laterality: N/A;       Family History  Problem Relation Age of Onset  . Hypertension Mother     Social History   Tobacco Use  . Smoking status: Former Smoker    Years: 15.00    Types: Cigarettes    Quit date: 03/18/2018    Years since quitting: 1.4  . Smokeless tobacco: Never Used  . Tobacco comment: 09/26/2017 "2-3 cigarettes/month now"  Substance Use Topics  . Alcohol use: Yes    Alcohol/week: 3.0 standard drinks    Types: 3 Cans of beer per week  . Drug use: Never    Home Medications Prior to Admission medications   Medication Sig Start Date End Date Taking? Authorizing Provider  apixaban (ELIQUIS) 5 MG TABS tablet Take 1 tablet (5 mg total) by mouth 2 (two) times daily. 10/19/18   Hongalgi, Maximino Greenland, MD  atorvastatin (LIPITOR) 20 MG tablet Take 1 tablet (20 mg total) by mouth daily. 11/02/18   Claiborne Rigg, NP  carvedilol (COREG) 25 MG tablet Take 1 tablet (25 mg total) by mouth 2 (two) times daily. 10/30/18 01/28/19  Rosalio Macadamia, NP  digoxin (LANOXIN) 0.125 MG tablet Take 1 tablet (0.125 mg total) by mouth daily. 10/30/18   Rosalio Macadamia, NP  furosemide (LASIX) 40 MG tablet Take 1 tablet (  40 mg total) by mouth daily. 10/30/18   Burtis Junes, NP  guaiFENesin-dextromethorphan (ROBITUSSIN DM) 100-10 MG/5ML syrup Take 5 mLs by mouth every 4 (four) hours as needed for cough. 10/20/18   Hongalgi, Lenis Dickinson, MD  insulin aspart (NOVOLOG) 100 UNIT/ML FlexPen Inject 4 Units into the skin 3 (three) times daily with meals. 10/19/18   Hongalgi, Lenis Dickinson, MD  Insulin Detemir (LEVEMIR) 100 UNIT/ML Pen Inject 14 Units into the skin at bedtime. 10/19/18   Hongalgi, Lenis Dickinson, MD  Insulin Pen Needle 32G X 4 MM MISC Use as directed 3 times a day with meals and at bedtime. 10/19/18   Hongalgi, Lenis Dickinson, MD   sacubitril-valsartan (ENTRESTO) 24-26 MG Take 1 tablet by mouth 2 (two) times daily. 10/30/18   Burtis Junes, NP  spironolactone (ALDACTONE) 25 MG tablet Take 0.5 tablets (12.5 mg total) by mouth daily. 10/30/18   Burtis Junes, NP    Allergies    Patient has no known allergies.  Review of Systems   Review of Systems  All other systems reviewed and are negative.   Physical Exam Updated Vital Signs BP (!) 150/99   Pulse 61   Temp 98.3 F (36.8 C) (Oral)   Resp 18   SpO2 98%   Physical Exam Vitals and nursing note reviewed.  Constitutional:      General: He is not in acute distress.    Appearance: Normal appearance. He is well-developed. He is not toxic-appearing.  HENT:     Head: Normocephalic and atraumatic.  Eyes:     General: Lids are normal.     Conjunctiva/sclera: Conjunctivae normal.     Pupils: Pupils are equal, round, and reactive to light.  Neck:     Thyroid: No thyroid mass.     Trachea: No tracheal deviation.  Cardiovascular:     Rate and Rhythm: Normal rate and regular rhythm.     Heart sounds: Normal heart sounds. No murmur. No gallop.   Pulmonary:     Effort: Pulmonary effort is normal. No respiratory distress.     Breath sounds: Normal breath sounds. No stridor. No decreased breath sounds, wheezing, rhonchi or rales.  Abdominal:     General: Bowel sounds are normal. There is no distension.     Palpations: Abdomen is soft.     Tenderness: There is no abdominal tenderness. There is no rebound.  Musculoskeletal:        General: No tenderness. Normal range of motion.     Cervical back: Normal range of motion and neck supple.     Comments: Patient with erythema warm to the touch as noted in picture.  Some lymphangitic spread going up his left forearm.  No crepitus or firmness to the left forearm compartment.  Neurovascular intact at left hand.  Skin:    General: Skin is warm and dry.     Findings: No abrasion or rash.  Neurological:     Mental  Status: He is alert and oriented to person, place, and time.     GCS: GCS eye subscore is 4. GCS verbal subscore is 5. GCS motor subscore is 6.     Cranial Nerves: No cranial nerve deficit.     Sensory: No sensory deficit.  Psychiatric:        Speech: Speech normal.        Behavior: Behavior normal.         ED Results / Procedures / Treatments   Labs (all labs ordered  are listed, but only abnormal results are displayed) Labs Reviewed  CULTURE, BLOOD (ROUTINE X 2)  CULTURE, BLOOD (ROUTINE X 2)  CBC WITH DIFFERENTIAL/PLATELET  BASIC METABOLIC PANEL  LACTIC ACID, PLASMA    EKG None  Radiology No results found.  Procedures Procedures (including critical care time)  Medications Ordered in ED Medications  0.9 %  sodium chloride infusion (has no administration in time range)  vancomycin (VANCOCIN) IVPB 1000 mg/200 mL premix (has no administration in time range)  tetanus & diphtheria toxoids (adult) (TENIVAC) injection 0.5 mL (has no administration in time range)  morphine 4 MG/ML injection 6 mg (has no administration in time range)    ED Course  I have reviewed the triage vital signs and the nursing notes.  Pertinent labs & imaging results that were available during my care of the patient were reviewed by me and considered in my medical decision making (see chart for details).    MDM Rules/Calculators/A&P                      Patient does have a history of diabetes and has a progressing cellulitis.  It is on the extensor surface of the left hand at this time.  Given pain medication as well as IV antibiotics.  Will require admission  Final Clinical Impression(s) / ED Diagnoses Final diagnoses:  None    Rx / DC Orders ED Discharge Orders    None       Lorre Nick, MD 08/26/19 1606    Lorre Nick, MD 08/26/19 [phone]

## 2019-08-26 NOTE — ED Notes (Signed)
Attempted to call report to [place] RN on 3E. RN sts they are in report at this time.

## 2019-08-26 NOTE — Progress Notes (Signed)
Pharmacy Antibiotic Note  David [name] is a 59 y.o. male admitted on 08/26/2019 with left hand wound infection.  In the ED, patient received Vancomycin 1gm IV @ 16:44.  Upon admission, Pharmacy has been consulted for Vancomycin dosing.  Plan:  Give an additional Vancomycin 750mg  IV x 1 dose now to provide total loading dose of 1750mg  (21 mg/kg)  Vancomycin 1000 mg IV Q 12 hrs. Goal AUC 400-550.  Expected AUC: 513; SCr used: 1.2   Height: 6' (182.9 cm) Weight: 81.6 kg (180 lb) IBW/kg (Calculated) : 77.6  Temp (24hrs), Avg:98.4 F (36.9 C), Min:98.3 F (36.8 C), Max:98.5 F (36.9 C)  Recent Labs  Lab 08/26/19 1634  WBC 8.9  CREATININE 1.20  LATICACIDVEN 2.8*    Estimated Creatinine Clearance: 74.5 mL/min (by C-G formula based on SCr of 1.2 mg/dL).    No Known Allergies  Antimicrobials this admission: 5/24 Ceftriaxone >>   5/24 Vanc >>    Dose adjustments this admission:    Microbiology results: 5/24 BCx:   5/24 Covid-19: negative  Thank you for allowing pharmacy to be a part of this patient's care.  6/24, PharmD 08/26/2019 7:02 PM

## 2019-08-26 NOTE — ED Notes (Signed)
Pt to be admitted. Pt aware and agreeable. [place] sandwich and water given. Cont to monitor.

## 2019-08-26 NOTE — ED Triage Notes (Signed)
Reports he believes he was bit by a brown recluse yesterday. Pt states that he noticed a small bug bite to left hand that has progressively gotten worse. There is now a scabbed over area with surrounding redness and swelling.

## 2019-08-26 NOTE — ED Provider Notes (Signed)
Signout note  59 year old male presenting to ER with rapidly spreading erythema over dorsum of left hand, concern for cellulitis.  Given medical comorbidities, rapid progression, likely would benefit from admission. Labs ordered.   4:47 PM Received sign out from [place], f/u labs, likely admit   Milagros Loll, MD 08/26/19 [phone]

## 2019-08-27 DIAGNOSIS — L0390 Cellulitis, unspecified: Secondary | ICD-10-CM

## 2019-08-27 LAB — CBC
HCT: 39.8 % (ref 39.0–52.0)
Hemoglobin: 13.8 g/dL (ref 13.0–17.0)
MCH: 32.8 pg (ref 26.0–34.0)
MCHC: 34.7 g/dL (ref 30.0–36.0)
MCV: 94.5 fL (ref 80.0–100.0)
Platelets: 192 10*3/uL (ref 150–400)
RBC: 4.21 MIL/uL — ABNORMAL LOW (ref 4.22–5.81)
RDW: 12.3 % (ref 11.5–15.5)
WBC: 9.9 10*3/uL (ref 4.0–10.5)
nRBC: 0 % (ref 0.0–0.2)

## 2019-08-27 LAB — BASIC METABOLIC PANEL
Anion gap: 8 (ref 5–15)
BUN: 14 mg/dL (ref 6–20)
CO2: 23 mmol/L (ref 22–32)
Calcium: 9 mg/dL (ref 8.9–10.3)
Chloride: 101 mmol/L (ref 98–111)
Creatinine, Ser: 1.06 mg/dL (ref 0.61–1.24)
GFR calc Af Amer: >60 mL/min (ref >60)
GFR calc non Af Amer: >60 mL/min (ref >60)
Glucose, Bld: 288 mg/dL — ABNORMAL HIGH (ref 70–99)
Potassium: 3.7 mmol/L (ref 3.5–5.1)
Sodium: 132 mmol/L — ABNORMAL LOW (ref 135–145)

## 2019-08-27 LAB — GLUCOSE, CAPILLARY
Glucose-Capillary: 251 mg/dL — ABNORMAL HIGH (ref 70–99)
Glucose-Capillary: 231 mg/dL — ABNORMAL HIGH (ref 70–99)
Glucose-Capillary: 228 mg/dL — ABNORMAL HIGH (ref 70–99)
Glucose-Capillary: 222 mg/dL — ABNORMAL HIGH (ref 70–99)

## 2019-08-27 MED ORDER — INSULIN DETEMIR 100 UNIT/ML ~~LOC~~ SOLN
14.0000 [IU] | Freq: Every day | SUBCUTANEOUS | Status: DC
  Administered 2019-08-27: 14 [IU] via SUBCUTANEOUS
  Filled 2019-08-27: qty 0.14

## 2019-08-27 MED ORDER — INSULIN ASPART 100 UNIT/ML ~~LOC~~ SOLN
6.0000 [IU] | Freq: Three times a day (TID) | SUBCUTANEOUS | Status: DC
  Administered 2019-08-27 – 2019-08-29 (×8): 6 [IU] via SUBCUTANEOUS

## 2019-08-27 NOTE — Progress Notes (Signed)
[name]                                                                              Patient Demographics  David [name], is a 59 y.o. male, DOB - 1960/10/25, DQQ:[id number]  Admit date - 08/26/2019   Admitting Physician Orene Desanctis, DO  Outpatient Primary MD for the patient is Gildardo Pounds, NP  Outpatient specialists:   LOS - 1  days   Medical records reviewed and are as summarized below:    Chief Complaint  Patient presents with  . Hand Pain       Brief summary   Patient is a 59 year old male with history of A. fib on Eliquis, cardiomyopathy, EF 35 to 40%, diabetes mellitus type 2 IDDM, presented with left hand cellulitis.  Patient noticed a swelling and a whitehead on his left posterior hand a day before the admission.  He was at a cookout prior to that and thinks that he might have been bitten by a bug.  Patient poured alcohol on it and tried to stick a safety pin in order to drain it.  However he had no pus drainage and only had bleeding.  On the morning of admission next day, he woke up with increasing throbbing pain, swelling and spreading erythema up to his forearm.  Patient reported unable to make a fist, otherwise denied any fevers or chills.   Assessment & Plan    Principal Problem: Left hand cellulitis -Continue IV Rocephin and vancomycin, patient noticed a slight improvement in the redness -Pain is improving with pain medications -Recommended to keep the arm elevated, no dressings needed -No OM or osseous abnormality noted on the x-ray.  However if cellulitis worsens, will need CT imaging to rule out any underlying abscess.  Active Problems:   Essential hypertension -BP mildly elevated likely due to pain, continue antihypertensives    Insulin dependent type 2 diabetes mellitus ([place]), uncontrolled with hyperglycemia -CBGs uncontrolled in 200s, a.m. blood sugar 288 this morning -Hemoglobin A1c 9.7 -Increase Levemir to 14 units at bedtime,  NovoLog to 6 units 3 times daily AC, continue sliding scale insulin    Cardiomyopathy ([place]), systolic CHF -Currently compensated, euvolemic.  2D echo 10/2018 had shown EF of 35 to 40%, moderately reduced right ventricular systolic function -Continue Coreg, Lasix, digoxin, Aldactone -Follow renal function closely    AF (paroxysmal atrial fibrillation) (HCC) -Heart rate controlled, continue Coreg, apixaban   Code Status: Full CODE STATUS DVT Prophylaxis: Eliquis Family Communication: Discussed all imaging results, lab results, explained to the patient  Disposition Plan:     Status is: Inpatient  Remains inpatient appropriate because:IV treatments appropriate due to intensity of illness or inability to take PO   Dispo: The patient is from: Home              Anticipated d/c is to: Home              Anticipated d/c date is: 2 days              Patient currently is not medically stable to d/c.   Time Spent in minutes 35  minutes  Procedures:  None  Consultants:   None  Antimicrobials:   Anti-infectives (From admission, onward)   Start     Dose/Rate Route Frequency Ordered Stop   08/27/19 0500  vancomycin (VANCOCIN) IVPB 1000 mg/200 mL premix     1,000 mg 200 mL/hr over 60 Minutes Intravenous Every 12 hours 08/26/19 1902     08/26/19 1915  vancomycin (VANCOREADY) IVPB 750 mg/150 mL     750 mg 150 mL/hr over 60 Minutes Intravenous  Once 08/26/19 1901 08/26/19 2145   08/26/19 1900  cefTRIAXone (ROCEPHIN) 1 g in sodium chloride 0.9 % 100 mL IVPB     1 g 200 mL/hr over 30 Minutes Intravenous Every 24 hours 08/26/19 1838     08/26/19 1600  vancomycin (VANCOCIN) IVPB 1000 mg/200 mL premix     1,000 mg 200 mL/hr over 60 Minutes Intravenous  Once 08/26/19 1551 08/26/19 1841          Medications  Scheduled Meds: . apixaban  5 mg Oral BID  . atorvastatin  20 mg Oral Daily  . carvedilol  25 mg Oral BID  . digoxin  0.125 mg Oral Daily  . furosemide  40 mg Oral Daily  .  insulin aspart  0-15 Units Subcutaneous TID WC  . insulin aspart  0-5 Units Subcutaneous QHS  . insulin aspart  4 Units Subcutaneous TID WC  . insulin detemir  10 Units Subcutaneous QHS  . oxyCODONE-acetaminophen  1 tablet Oral Q6H  . sacubitril-valsartan  1 tablet Oral BID  . spironolactone  12.5 mg Oral Daily   Continuous Infusions: . sodium chloride 10 mL (08/26/19 1647)  . cefTRIAXone (ROCEPHIN)  IV Stopped (08/26/19 1921)  . vancomycin 1,000 mg (08/27/19 0509)   PRN Meds:.ibuprofen, morphine injection      Subjective:   David [name] was seen and examined today.  Redness on the left hand improving however still has tenderness, unable to make the fist.  No fevers or chills. Patient denies dizziness, chest pain, shortness of breath, abdominal pain, N/V/D/C, new weakness, numbess, tingling. No acute events overnight.    Objective:   Vitals:   08/26/19 2035 08/26/19 2313 08/27/19 0221 08/27/19 0637  BP: (!) 153/97 (!) 164/117 (!) 150/94 (!) 135/92  Pulse: 83 81 78 66  Resp:  20 18 18   Temp:  97.6 F (36.4 C) 99.9 F (37.7 C) 98.2 F (36.8 C)  TempSrc:  Oral Oral Oral  SpO2:  96% 96% 98%  Weight:      Height:        Intake/Output Summary (Last 24 hours) at 08/27/2019 1028 Last data filed at 08/27/2019 08/29/2019 Gross per 24 hour  Intake 1661.45 ml  Output 1601 ml  Net 60.45 ml     Wt Readings from Last 3 Encounters:  08/26/19 81.6 kg  10/30/18 85.7 kg  10/20/18 87.5 kg     Exam  General: Alert and oriented x 3, NAD  Cardiovascular: S1 S2 auscultated, no murmurs, RRR  Respiratory: Clear to auscultation bilaterally, no wheezing, rales or rhonchi  Gastrointestinal: Soft, nontender, nondistended, + bowel sounds  Ext: no pedal edema bilaterally  Neuro: No new deficits  Musculoskeletal: No digital cyanosis, clubbing  Skin: Cellulitis on the dorsum of the left hand with eschar, warm and tender, no indurated area or crepitus,  Psych: Normal affect and  demeanor, alert and oriented x3    Data Reviewed:  I have personally reviewed following labs and imaging studies  Micro Results Recent Results (  from the past 240 hour(s))  SARS Coronavirus 2 by RT PCR (hospital order, performed in [facility] hospital lab) Nasopharyngeal Nasopharyngeal Swab     Status: None   Collection Time: 08/26/19  4:38 PM   Specimen: Nasopharyngeal Swab  Result Value Ref Range Status   SARS Coronavirus 2 NEGATIVE NEGATIVE Final    Comment: (NOTE) SARS-CoV-2 target nucleic acids are NOT DETECTED. The SARS-CoV-2 RNA is generally detectable in upper and lower respiratory specimens during the acute phase of infection. The lowest concentration of SARS-CoV-2 viral copies this assay can detect is 250 copies / mL. A negative result does not preclude SARS-CoV-2 infection and should not be used as the sole basis for treatment or other patient management decisions.  A negative result may occur with improper specimen collection / handling, submission of specimen other than nasopharyngeal swab, presence of viral mutation(s) within the areas targeted by this assay, and inadequate number of viral copies (<250 copies / mL). A negative result must be combined with clinical observations, patient history, and epidemiological information. Fact Sheet for Patients:   BoilerBrush.com.cy Fact Sheet for Healthcare Providers: https://pope.com/ This test is not yet approved or cleared  by the [place] FDA and has been authorized for detection and/or diagnosis of SARS-CoV-2 by FDA under an Emergency Use Authorization (EUA).  This EUA will remain in effect (meaning this test can be used) for the duration of the COVID-19 declaration under Section 564(b)(1) of the Act, 21 U.S.C. section 360bbb-3(b)(1), unless the authorization is terminated or revoked sooner. Performed at [facility], [address], [place],  [place] [address]     Radiology Reports DG Hand Complete Left  Result Date: 08/26/2019 CLINICAL DATA:  Possible bug bite. EXAM: LEFT HAND - COMPLETE 3+ VIEW COMPARISON:  None. FINDINGS: There is no evidence of fracture or dislocation. There is no evidence of arthropathy or other focal bone abnormality. Mild diffuse dorsal soft tissue swelling is seen. IMPRESSION: Mild diffuse dorsal soft tissue swelling without evidence of acute osseous abnormality. Electronically Signed   By: Aram Candela M.D.   On: 08/26/2019 19:21    Lab Data:  CBC: Recent Labs  Lab 08/26/19 1634 08/27/19 0726  WBC 8.9 9.9  NEUTROABS 6.0  --   HGB 14.6 13.8  HCT 42.0 39.8  MCV 94.4 94.5  PLT 219 192   Basic Metabolic Panel: Recent Labs  Lab 08/26/19 1634 08/27/19 0459  NA 134* 132*  K 3.9 3.7  CL 98 101  CO2 25 23  GLUCOSE 373* 288*  BUN 21* 14  CREATININE 1.20 1.06  CALCIUM 9.4 9.0   GFR: Estimated Creatinine Clearance: 84.4 mL/min (by C-G formula based on SCr of 1.06 mg/dL). Liver Function Tests: No results for input(s): AST, ALT, ALKPHOS, BILITOT, PROT, ALBUMIN in the last 168 hours. No results for input(s): LIPASE, AMYLASE in the last 168 hours. No results for input(s): AMMONIA in the last 168 hours. Coagulation Profile: No results for input(s): INR, PROTIME in the last 168 hours. Cardiac Enzymes: No results for input(s): CKTOTAL, CKMB, CKMBINDEX, TROPONINI in the last 168 hours. BNP (last 3 results) No results for input(s): PROBNP in the last 8760 hours. HbA1C: Recent Labs    08/26/19 1840  HGBA1C 9.7*   CBG: Recent Labs  Lab 08/26/19 2325 08/27/19 0741  GLUCAP 250* 251*   Lipid Profile: No results for input(s): CHOL, HDL, LDLCALC, TRIG, CHOLHDL, LDLDIRECT in the last 72 hours. Thyroid Function Tests: No results for input(s): TSH, T4TOTAL,  FREET4, T3FREE, THYROIDAB in the last 72 hours. Anemia Panel: No results for input(s): VITAMINB12, FOLATE, FERRITIN, TIBC, IRON, RETICCTPCT  in the last 72 hours. Urine analysis: No results found for: COLORURINE, APPEARANCEUR, LABSPEC, PHURINE, GLUCOSEU, HGBUR, BILIRUBINUR, KETONESUR, PROTEINUR, UROBILINOGEN, NITRITE, LEUKOCYTESUR   Tikita Mabee M.D. [name] 08/27/2019, 10:28 AM   Call night coverage person covering after 7pm

## 2019-08-28 ENCOUNTER — Inpatient Hospital Stay (HOSPITAL_COMMUNITY): Payer: Self-pay

## 2019-08-28 DIAGNOSIS — L03119 Cellulitis of unspecified part of limb: Secondary | ICD-10-CM

## 2019-08-28 LAB — BASIC METABOLIC PANEL
Anion gap: 9 (ref 5–15)
BUN: 14 mg/dL (ref 6–20)
CO2: 25 mmol/L (ref 22–32)
Calcium: 9 mg/dL (ref 8.9–10.3)
Chloride: 99 mmol/L (ref 98–111)
Creatinine, Ser: 1.05 mg/dL (ref 0.61–1.24)
GFR calc Af Amer: >60 mL/min (ref >60)
GFR calc non Af Amer: >60 mL/min (ref >60)
Glucose, Bld: 232 mg/dL — ABNORMAL HIGH (ref 70–99)
Potassium: 3.6 mmol/L (ref 3.5–5.1)
Sodium: 133 mmol/L — ABNORMAL LOW (ref 135–145)

## 2019-08-28 LAB — GLUCOSE, CAPILLARY
Glucose-Capillary: 255 mg/dL — ABNORMAL HIGH (ref 70–99)
Glucose-Capillary: 310 mg/dL — ABNORMAL HIGH (ref 70–99)
Glucose-Capillary: 204 mg/dL — ABNORMAL HIGH (ref 70–99)
Glucose-Capillary: 262 mg/dL — ABNORMAL HIGH (ref 70–99)

## 2019-08-28 IMAGING — MR MR [PERSON_NAME]*[PERSON_NAME]* WO/W CM
8 series · 40 of 40 positions shown · IV contrast (gadavist)
Comparison: Radiographs [DATE]

CLINICAL DATA: Spider bite on dorsal and ulnar of the hand.
Persistent pain and swelling.

EXAM:
MRI OF THE LEFT HAND WITHOUT AND WITH CONTRAST
TECHNIQUE: Multiplanar, multisequence MR imaging of the left hand was performed
before and after the administration of intravenous contrast.
CONTRAST:  9mL GADAVIST GADOBUTROL 1 MMOL/ML IV SOLN

[Series 3: T1 · coronal · left · 3.0mm · 0.51mm/px · 5 of 17 slices shown (1 of 2)]
[im 1/17]
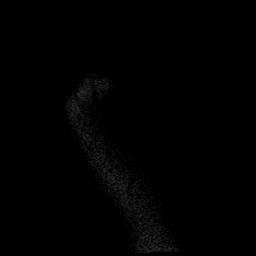
[im 5/17]
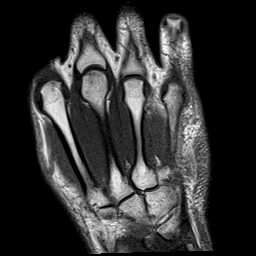
[im 9/17]
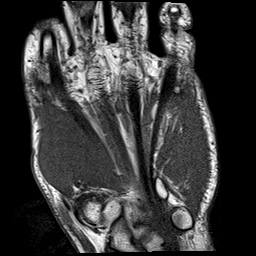
[im 13/17]
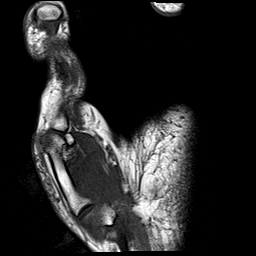
[im 17/17]
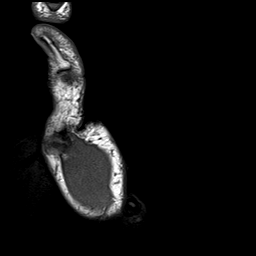

[Series 5: STIR · coronal · left · 3.0mm · 0.51mm/px · 5 of 18 slices shown]
[im 1/18]
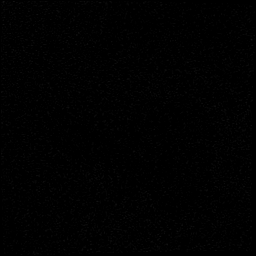
[im 5/18]
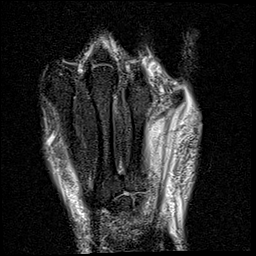
[im 9/18]
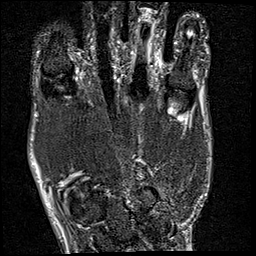
[im 13/18]
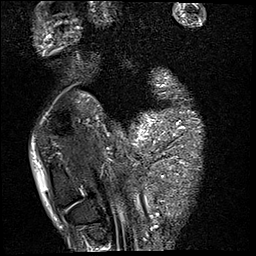
[im 18/18]
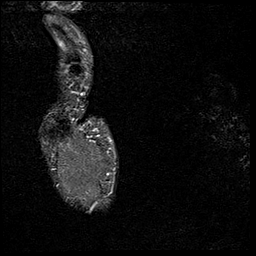

[Series 9: PD fat-sat · oblique · left · 3.0mm · 0.20mm/px · 6 of 27 slices shown]
[im 1/27]
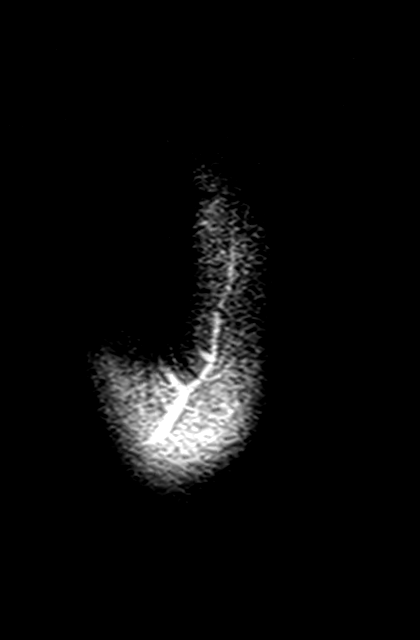
[im 6/27]
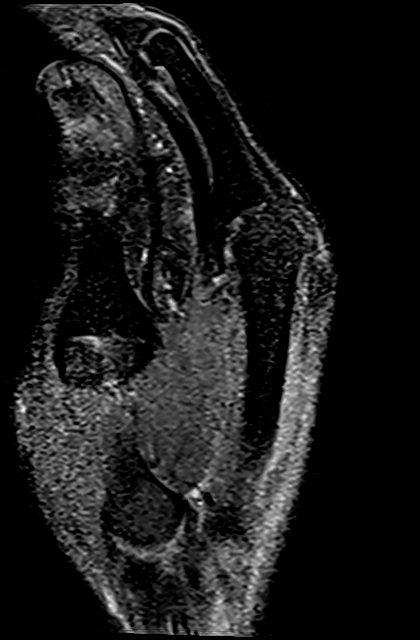
[im 11/27]
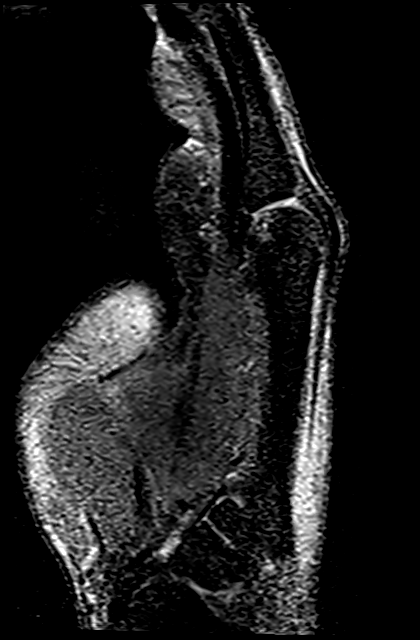
[im 16/27]
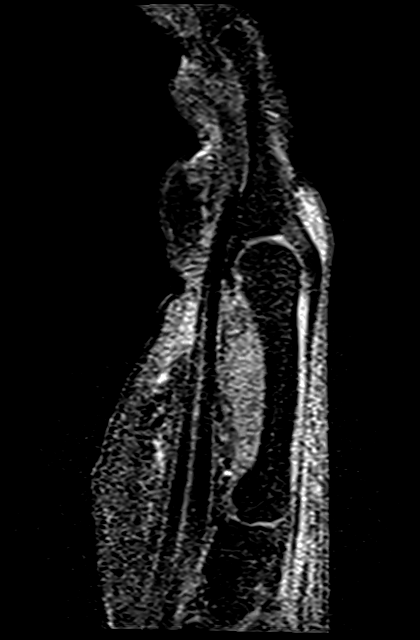
[im 21/27]
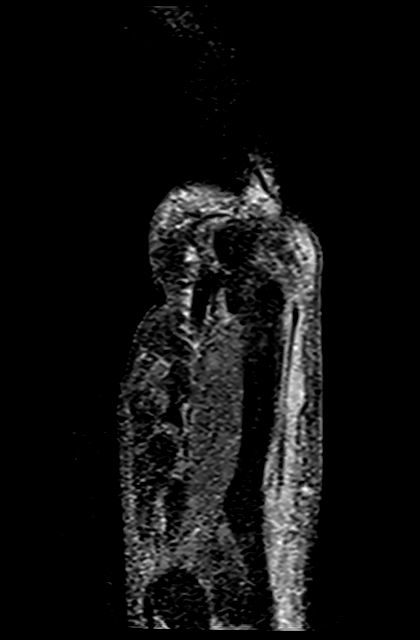
[im 27/27]
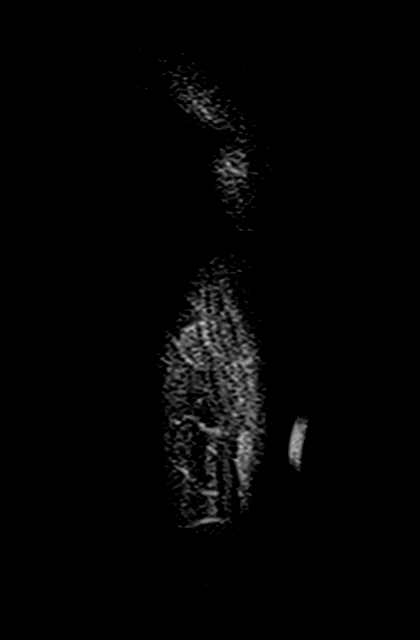

[Series 11: T1 fat-sat post-contrast · coronal · left · 3.0mm · 0.51mm/px · 4 of 18 slices shown (1 of 2)]
[im 1/18]
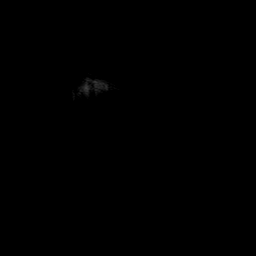
[im 6/18]
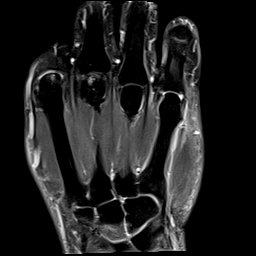
[im 12/18]
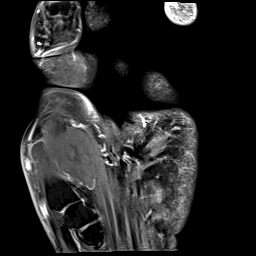
[im 18/18]
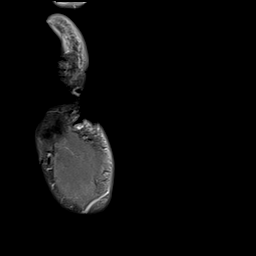

[Series 100: T1 · oblique · left · 4.0mm · 0.51mm/px · 5 of 22 slices shown (2 of 2)]
[im 1/22]
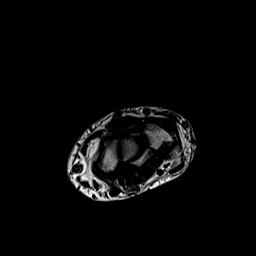
[im 6/22]
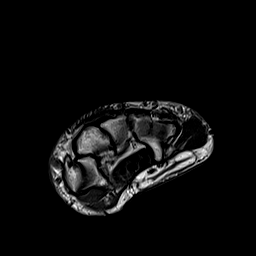
[im 11/22]
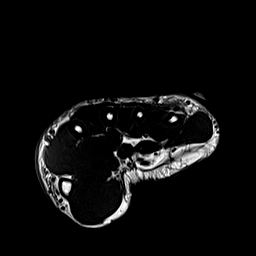
[im 16/22]
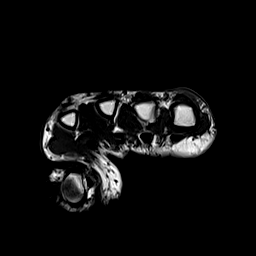
[im 22/22]
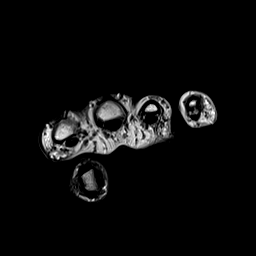

[Series 101: T2 fat-sat · oblique · left · 4.0mm · 0.51mm/px · 5 of 22 slices shown]
[im 1/22]
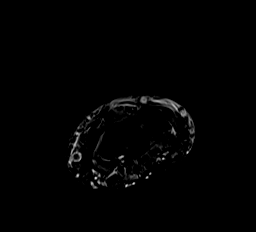
[im 6/22]
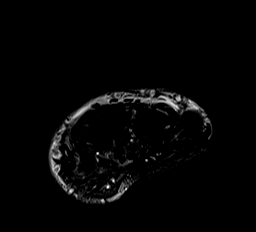
[im 11/22]
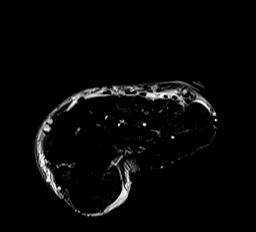
[im 16/22]
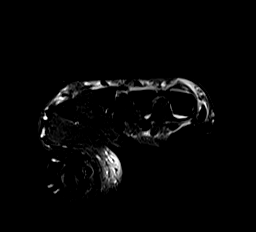
[im 22/22]
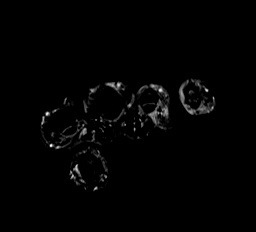

[Series 102: T1 fat-sat · oblique · non-contrast · left · 4.0mm · 0.51mm/px · 5 of 22 slices shown]
[im 1/22]
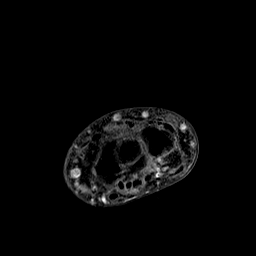
[im 6/22]
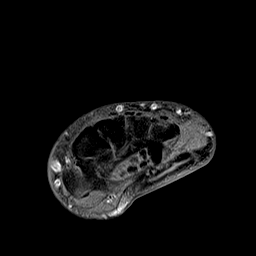
[im 11/22]
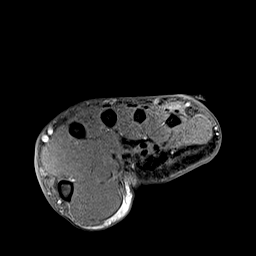
[im 16/22]
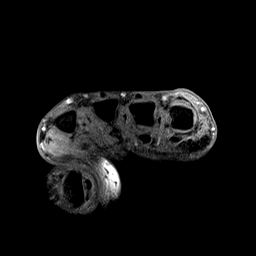
[im 22/22]
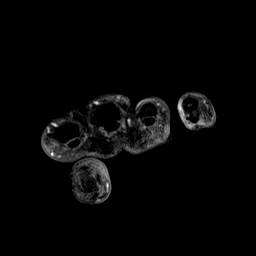

[Series 103: T1 fat-sat post-contrast · oblique · left · 4.0mm · 0.51mm/px · 5 of 22 slices shown (2 of 2)]
[im 1/22]
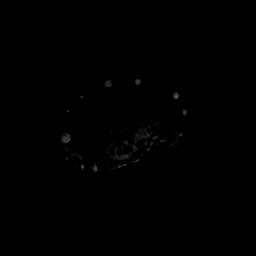
[im 6/22]
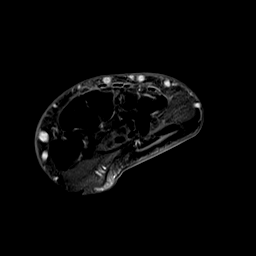
[im 11/22]
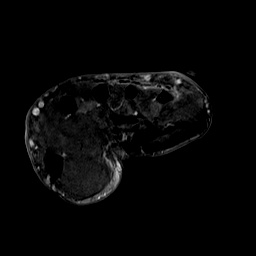
[im 16/22]
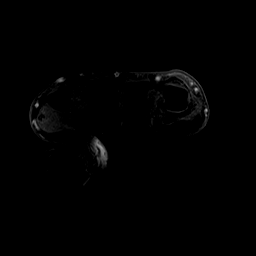
[im 22/22]
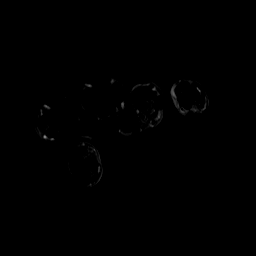

[40 of 40 positions shown; findings below may reference images not displayed]

FINDINGS: Diffuse subcutaneous soft tissue swelling/edema/fluid mainly along
the dorsal and ulnar aspect of the hand suggesting cellulitis. No
discrete rim enhancing soft tissue abscess is identified. There is
moderate inflammation/fluid enhancement around the distal aspect of
the abductor digiti minimi and around its tendinous attachment. No
findings for pyomyositis.

Mild degenerative changes noted at the second and third MTP joints
but no findings suspicious for septic arthritis. The wrist also
demonstrates mild degenerative changes but no acute bony findings.

The dorsal and volar hand tendons are intact.
IMPRESSION: 1. Diffuse subcutaneous soft tissue swelling/edema/fluid mainly
along the dorsal and ulnar aspect of the hand suggesting cellulitis.
No discrete rim enhancing soft tissue abscess.
2. Moderate inflammation/fluid enhancement around the distal aspect
of the adductor digiti minimi and around its tendinous attachment.
No findings for pyomyositis.
3. Mild degenerative changes at the second and third MTP joints but
no findings suspicious for septic arthritis or osteomyelitis.

## 2019-08-28 MED ORDER — GADOBUTROL 1 MMOL/ML IV SOLN
9.0000 mL | Freq: Once | INTRAVENOUS | Status: AC | PRN
  Administered 2019-08-28: 9 mL via INTRAVENOUS

## 2019-08-28 MED ORDER — INSULIN DETEMIR 100 UNIT/ML ~~LOC~~ SOLN
18.0000 [IU] | Freq: Every day | SUBCUTANEOUS | Status: DC
  Administered 2019-08-28: 18 [IU] via SUBCUTANEOUS
  Filled 2019-08-28 (×2): qty 0.18

## 2019-08-28 NOTE — Progress Notes (Addendum)
[name]                                                                              Patient Demographics  David [name], is a 59 y.o. male, DOB - Aug 21, 1960, OEU:[id number]  Admit date - 08/26/2019   Admitting Physician Anselm Jungling, DO  Outpatient Primary MD for the patient is Claiborne Rigg, NP  Outpatient specialists:   LOS - 2  days   Medical records reviewed and are as summarized below:    Chief Complaint  Patient presents with  . Hand Pain       Brief summary   Patient is a 59 year old male with history of A. fib on Eliquis, cardiomyopathy, EF 35 to 40%, diabetes mellitus type 2 IDDM, presented with left hand cellulitis.  Patient noticed a swelling and a whitehead on his left posterior hand a day before the admission.  He was at a cookout prior to that and thinks that he might have been bitten by a bug.  Patient poured alcohol on it and tried to stick a safety pin in order to drain it.  However he had no pus drainage and only had bleeding.  On the morning of admission next day, he woke up with increasing throbbing pain, swelling and spreading erythema up to his forearm.  Patient reported unable to make a fist, otherwise denied any fevers or chills.   Assessment & Plan    Principal Problem: Left hand cellulitis -Per patient, no significant improvement from yesterday and erythema and swelling -Pain is controlled with the pain medications.  Continue to keep arm elevated -Continue  vancomycin, IV Rocephin -No OM or osseous abnormality noted on the x-ray.   - small area of induration and tenderness on the dorsum of the left hand.  Discussed with hand surgery, Earney Hamburg, recommended MRI of the left hand w/ and w/o contrast.  If any abscess, osteomyelitis or tendons involved, to call back to evaluate.  If negative, continue IV antibiotics.   Active Problems:   Essential hypertension -BP mildly elevated likely due to pain, continue  antihypertensives    Insulin dependent type 2 diabetes mellitus (HCC), uncontrolled with hyperglycemia -CBGs still uncontrolled, a.m. blood sugar 232 -Hemoglobin A1c 9.7 - Increase Levemir to 18 units at bedtime, continue NovoLog 6 units 3 times daily AC, sliding scale insulin    Cardiomyopathy (HCC), systolic CHF -Currently compensated, euvolemic.  2D echo 10/2018 had shown EF of 35 to 40%, moderately reduced right ventricular systolic function -Continue Coreg, Lasix, digoxin, Aldactone -Renal function stable    AF (paroxysmal atrial fibrillation) (HCC) -Heart rate controlled, continue Coreg, apixaban   Code Status: Full CODE STATUS DVT Prophylaxis: Eliquis Family Communication: Discussed all imaging results, lab results, explained to the patient  Disposition Plan:     Status is: Inpatient  Remains inpatient appropriate because:IV treatments appropriate due to intensity of illness or inability to take PO   Dispo: The patient is from: Home              Anticipated d/c is to: Home  Anticipated d/c date is: 2 days              Patient currently is not medically stable to d/c.   Time Spent in minutes 35 minutes  Procedures:  None  Consultants:   None  Antimicrobials:   Anti-infectives (From admission, onward)   Start     Dose/Rate Route Frequency Ordered Stop   08/27/19 0500  vancomycin (VANCOCIN) IVPB 1000 mg/200 mL premix     1,000 mg 200 mL/hr over 60 Minutes Intravenous Every 12 hours 08/26/19 1902     08/26/19 1915  vancomycin (VANCOREADY) IVPB 750 mg/150 mL     750 mg 150 mL/hr over 60 Minutes Intravenous  Once 08/26/19 1901 08/26/19 2145   08/26/19 1900  cefTRIAXone (ROCEPHIN) 1 g in sodium chloride 0.9 % 100 mL IVPB     1 g 200 mL/hr over 30 Minutes Intravenous Every 24 hours 08/26/19 1838     08/26/19 1600  vancomycin (VANCOCIN) IVPB 1000 mg/200 mL premix     1,000 mg 200 mL/hr over 60 Minutes Intravenous  Once 08/26/19 1551 08/26/19 1841          Medications  Scheduled Meds: . apixaban  5 mg Oral BID  . atorvastatin  20 mg Oral Daily  . carvedilol  25 mg Oral BID  . digoxin  0.125 mg Oral Daily  . furosemide  40 mg Oral Daily  . insulin aspart  0-15 Units Subcutaneous TID WC  . insulin aspart  0-5 Units Subcutaneous QHS  . insulin aspart  6 Units Subcutaneous TID WC  . insulin detemir  18 Units Subcutaneous QHS  . oxyCODONE-acetaminophen  1 tablet Oral Q6H  . sacubitril-valsartan  1 tablet Oral BID  . spironolactone  12.5 mg Oral Daily   Continuous Infusions: . sodium chloride 10 mL (08/26/19 1647)  . cefTRIAXone (ROCEPHIN)  IV 1 g (08/27/19 1846)  . vancomycin 1,000 mg (08/28/19 0449)   PRN Meds:.ibuprofen, morphine injection      Subjective:   David [name] was seen and examined today.  States pain is controlled with medications however still no appreciable improvement in the left hand swelling and redness.  No fevers or chills overnight.  Patient denies dizziness, chest pain, shortness of breath, abdominal pain, N/V/D/C, new weakness, numbess, tingling.  No acute events overnight  Objective:   Vitals:   08/27/19 0637 08/27/19 1354 08/27/19 2130 08/28/19 0616  BP: (!) 135/92 103/68 (!) 140/97 (!) 134/100  Pulse: 66 73 73 66  Resp: 18 17 18 17   Temp: 98.2 F (36.8 C) 97.8 F (36.6 C) (!) 97.5 F (36.4 C) 98.7 F (37.1 C)  TempSrc: Oral Oral Oral Oral  SpO2: 98% 97% 99%   Weight:      Height:        Intake/Output Summary (Last 24 hours) at 08/28/2019 1035 Last data filed at 08/28/2019 0953 Gross per 24 hour  Intake 1400 ml  Output 1750 ml  Net -350 ml     Wt Readings from Last 3 Encounters:  08/26/19 81.6 kg  10/30/18 85.7 kg  10/20/18 87.5 kg   Physical Exam  General: Alert and oriented x 3, NAD  Cardiovascular: S1 S2 clear, RRR. No pedal edema b/l  Respiratory: CTAB, no wheezing, rales or rhonchi  Gastrointestinal: Soft, nontender, nondistended, NBS  Ext: no pedal edema  bilaterally  Neuro: no new deficits  Musculoskeletal: No cyanosis, clubbing  Skin: Cellulitis on dorsum of left hand, warm, tender has small indurated area ~  1cm, see below   Psych: Normal affect and demeanor, alert and oriented x3         Data Reviewed:  I have personally reviewed following labs and imaging studies  Micro Results Recent Results (from the past 240 hour(s))  Culture, blood (Routine X 2) w Reflex to ID Panel     Status: None (Preliminary result)   Collection Time: 08/26/19  3:52 PM   Specimen: BLOOD  Result Value Ref Range Status   Specimen Description   Final    BLOOD RIGHT ANTECUBITAL Performed at [facility] [address] [place], [place] [address]    Special Requests   Final    BOTTLES DRAWN AEROBIC AND ANAEROBIC Blood Culture results may not be optimal due to an inadequate volume of blood received in culture bottles Performed at [facility] [address] [place], [place] [address]    Culture   Final    NO GROWTH < 24 HOURS Performed at [facility] [address], [place], [place] [address]    Report Status PENDING  Incomplete  Culture, blood (Routine X 2) w Reflex to ID Panel     Status: None (Preliminary result)   Collection Time: 08/26/19  3:57 PM   Specimen: BLOOD  Result Value Ref Range Status   Specimen Description   Final    BLOOD RIGHT ANTECUBITAL Performed at [facility] [address], [place], [place] [address]    Special Requests   Final    BOTTLES DRAWN AEROBIC AND ANAEROBIC Blood Culture adequate volume Performed at [facility] [address], [place], [place] [address]    Culture   Final    NO GROWTH < 24 HOURS Performed at [facility] [address], [place], [place] [address]    Report Status PENDING  Incomplete  SARS Coronavirus 2 by RT PCR (hospital order, performed in [facility] hospital lab) Nasopharyngeal Nasopharyngeal  Swab     Status: None   Collection Time: 08/26/19  4:38 PM   Specimen: Nasopharyngeal Swab  Result Value Ref Range Status   SARS Coronavirus 2 NEGATIVE NEGATIVE Final    Comment: (NOTE) SARS-CoV-2 target nucleic acids are NOT DETECTED. The SARS-CoV-2 RNA is generally detectable in upper and lower respiratory specimens during the acute phase of infection. The lowest concentration of SARS-CoV-2 viral copies this assay can detect is 250 copies / mL. A negative result does not preclude SARS-CoV-2 infection and should not be used as the sole basis for treatment or other patient management decisions.  A negative result may occur with improper specimen collection / handling, submission of specimen other than nasopharyngeal swab, presence of viral mutation(s) within the areas targeted by this assay, and inadequate number of viral copies (<250 copies / mL). A negative result must be combined with clinical observations, patient history, and epidemiological information. Fact Sheet for Patients:   StrictlyIdeas.no Fact Sheet for Healthcare Providers: BankingDealers.co.za This test is not yet approved or cleared  by the [place] FDA and has been authorized for detection and/or diagnosis of SARS-CoV-2 by FDA under an Emergency Use Authorization (EUA).  This EUA will remain in effect (meaning this test can be used) for the duration of the COVID-19 declaration under Section 564(b)(1) of the Act, 21 U.S.C. section 360bbb-3(b)(1), unless the authorization is terminated or revoked sooner. Performed at [facility], [place] [address] [place],  [address]     Radiology Reports  DG Hand Complete Left  Result Date: 08/26/2019 CLINICAL DATA:  Possible bug bite. EXAM: LEFT HAND - COMPLETE 3+ VIEW COMPARISON:  None. FINDINGS: There is no evidence of fracture or dislocation. There is no evidence of arthropathy or other focal bone  abnormality. Mild diffuse dorsal soft tissue swelling is seen. IMPRESSION: Mild diffuse dorsal soft tissue swelling without evidence of acute osseous abnormality. Electronically Signed   By: Aram Candela M.D.   On: 08/26/2019 19:21    Lab Data:  CBC: Recent Labs  Lab 08/26/19 1634 08/27/19 0726  WBC 8.9 9.9  NEUTROABS 6.0  --   HGB 14.6 13.8  HCT 42.0 39.8  MCV 94.4 94.5  PLT 219 192   Basic Metabolic Panel: Recent Labs  Lab 08/26/19 1634 08/27/19 0459 08/28/19 0431  NA 134* 132* 133*  K 3.9 3.7 3.6  CL 98 101 99  CO2 25 23 25   GLUCOSE 373* 288* 232*  BUN 21* 14 14  CREATININE 1.20 1.06 1.05  CALCIUM 9.4 9.0 9.0   GFR: Estimated Creatinine Clearance: 85.2 mL/min (by C-G formula based on SCr of 1.05 mg/dL). Liver Function Tests: No results for input(s): AST, ALT, ALKPHOS, BILITOT, PROT, ALBUMIN in the last 168 hours. No results for input(s): LIPASE, AMYLASE in the last 168 hours. No results for input(s): AMMONIA in the last 168 hours. Coagulation Profile: No results for input(s): INR, PROTIME in the last 168 hours. Cardiac Enzymes: No results for input(s): CKTOTAL, CKMB, CKMBINDEX, TROPONINI in the last 168 hours. BNP (last 3 results) No results for input(s): PROBNP in the last 8760 hours. HbA1C: Recent Labs    08/26/19 1840  HGBA1C 9.7*   CBG: Recent Labs  Lab 08/27/19 0741 08/27/19 1146 08/27/19 1635 08/27/19 2127 08/28/19 0735  GLUCAP 251* 231* 228* 222* 255*   Lipid Profile: No results for input(s): CHOL, HDL, LDLCALC, TRIG, CHOLHDL, LDLDIRECT in the last 72 hours. Thyroid Function Tests: No results for input(s): TSH, T4TOTAL, FREET4, T3FREE, THYROIDAB in the last 72 hours. Anemia Panel: No results for input(s): VITAMINB12, FOLATE, FERRITIN, TIBC, IRON, RETICCTPCT in the last 72 hours. Urine analysis: No results found for: COLORURINE, APPEARANCEUR, LABSPEC, PHURINE, GLUCOSEU, HGBUR, BILIRUBINUR, KETONESUR, PROTEINUR, UROBILINOGEN, NITRITE,  LEUKOCYTESUR   Zaden Sako M.D. [name] 08/28/2019, 10:35 AM   Call night coverage person covering after 7pm

## 2019-08-29 DIAGNOSIS — I5022 Chronic systolic (congestive) heart failure: Secondary | ICD-10-CM

## 2019-08-29 LAB — BASIC METABOLIC PANEL
Anion gap: 12 (ref 5–15)
BUN: 18 mg/dL (ref 6–20)
CO2: 23 mmol/L (ref 22–32)
Calcium: 8.7 mg/dL — ABNORMAL LOW (ref 8.9–10.3)
Chloride: 97 mmol/L — ABNORMAL LOW (ref 98–111)
Creatinine, Ser: 0.97 mg/dL (ref 0.61–1.24)
GFR calc Af Amer: >60 mL/min (ref >60)
GFR calc non Af Amer: >60 mL/min (ref >60)
Glucose, Bld: 262 mg/dL — ABNORMAL HIGH (ref 70–99)
Potassium: 3.6 mmol/L (ref 3.5–5.1)
Sodium: 132 mmol/L — ABNORMAL LOW (ref 135–145)

## 2019-08-29 LAB — GLUCOSE, CAPILLARY
Glucose-Capillary: 316 mg/dL — ABNORMAL HIGH (ref 70–99)
Glucose-Capillary: 211 mg/dL — ABNORMAL HIGH (ref 70–99)
Glucose-Capillary: 180 mg/dL — ABNORMAL HIGH (ref 70–99)
Glucose-Capillary: 139 mg/dL — ABNORMAL HIGH (ref 70–99)
Glucose-Capillary: 336 mg/dL — ABNORMAL HIGH (ref 70–99)

## 2019-08-29 MED ORDER — SODIUM CHLORIDE 0.9 % IV SOLN
600.0000 mg | Freq: Two times a day (BID) | INTRAVENOUS | Status: DC
  Administered 2019-08-29 – 2019-08-30 (×2): 600 mg via INTRAVENOUS
  Filled 2019-08-29 (×3): qty 600

## 2019-08-29 MED ORDER — MORPHINE SULFATE (PF) 2 MG/ML IV SOLN
1.0000 mg | INTRAVENOUS | Status: DC | PRN
  Administered 2019-08-29: 1 mg via INTRAVENOUS
  Filled 2019-08-29: qty 1

## 2019-08-29 MED ORDER — INSULIN DETEMIR 100 UNIT/ML ~~LOC~~ SOLN
20.0000 [IU] | Freq: Every day | SUBCUTANEOUS | Status: DC
  Administered 2019-08-29: 20 [IU] via SUBCUTANEOUS
  Filled 2019-08-29 (×2): qty 0.2

## 2019-08-29 MED ORDER — OXYCODONE-ACETAMINOPHEN 5-325 MG PO TABS
1.0000 | ORAL_TABLET | Freq: Four times a day (QID) | ORAL | Status: DC | PRN
  Administered 2019-08-29: 2 via ORAL
  Administered 2019-08-29: 1 via ORAL
  Administered 2019-08-30 (×2): 2 via ORAL
  Filled 2019-08-29: qty 2
  Filled 2019-08-29 (×3): qty 1
  Filled 2019-08-29: qty 2

## 2019-08-29 MED ORDER — MORPHINE SULFATE (PF) 2 MG/ML IV SOLN
2.0000 mg | INTRAVENOUS | Status: DC | PRN
  Administered 2019-08-29 – 2019-08-30 (×3): 2 mg via INTRAVENOUS
  Filled 2019-08-29 (×3): qty 1

## 2019-08-29 MED ORDER — LIDOCAINE HCL 1 % IJ SOLN
20.0000 mL | Freq: Once | INTRAMUSCULAR | Status: AC
  Administered 2019-08-29: 20 mL via INTRADERMAL
  Filled 2019-08-29: qty 20

## 2019-08-29 MED ORDER — INSULIN ASPART 100 UNIT/ML ~~LOC~~ SOLN
8.0000 [IU] | Freq: Three times a day (TID) | SUBCUTANEOUS | Status: DC
  Administered 2019-08-30 (×3): 8 [IU] via SUBCUTANEOUS

## 2019-08-29 NOTE — Progress Notes (Signed)
PROGRESS NOTE  David [name] SWF:[id number] DOB: 04/10/1960 DOA: 08/26/2019 PCP: David Pounds, NP  Follow-up with [facility] at discharge  Brief History   59 year old man PMH including diabetes mellitus type 2 on insulin, atrial fibrillation on apixaban, presented with left hand cellulitis which he thinks was secondary to brown recluse bite  A & P  Left hand cellulitis, patient reportedly had a spider bite. --MRI without discrete abscess or osteomyelitis.  However despite nearly 72 hours of antibiotics, failing to improve, still has significant tenderness and inability to fully extend left pinky finger as well as some impaired sensation --Hand consultation for evaluation and recommendations discussed with David [name].  Given minimal improvement, will also request ID evaluation for further recommendations, guidance on antibiotics.  Diabetes mellitus type 2 on insulin.  Hemoglobin A1c 9.7. --Continue insulin scheduled and sliding scale --Continue statin  Mild hyponatremia, asymptomatic.  Suspect diuretic related.  Monitor clinically.  Atrial fibrillation --Continue apixaban, carvedilol, digoxin  Chronic systolic CHF, LVEF 22-02% cardiogram July 2020. --Appears compensated.  Continue carvedilol, digoxin, furosemide, spironolactone  Disposition Plan:  From: home Anticipated disposition: home Discussion: Continues to have significant tenderness left hand, not significantly improved despite nearly 3 days of antibiotics.  Hand surgery consult today.  Status is: Inpatient  Remains inpatient appropriate because:IV treatments appropriate due to intensity of illness or inability to take PO   Dispo: The patient is from: Home              Anticipated d/c is to: Home              Anticipated d/c date is: 3 days              Patient currently is not medically stable to d/c.  DVT prophylaxis: SCDs (resume apixaban when able) Code Status: Full Family Communication: none  present or requested, pt young, alret, seems to understand condition.    David Hodgkins, MD  [facility] Direct contact: see [email or web address] (further directions at bottom of note if needed) 7PM-7AM contact night coverage as at bottom of note 08/29/2019, 10:31 AM  LOS: 3 days   Significant Hospital Events   .    Consults:  .    Procedures:  .   Significant Diagnostic Tests:  Marland Kitchen    Micro Data:  .    Antimicrobials:  .   Interval History/Subjective  No significant improvement in left hand pain, tenderness.  Unable to fully extend left pinky finger.  Some numbness on pinky finger.  Very tender over hand.  Objective   Vitals:  Vitals:   08/28/19 2137 08/29/19 0550  BP: 130/85 119/75  Pulse: 80 60  Resp: 16 18  Temp: 98.9 F (37.2 C) 97.7 F (36.5 C)  SpO2: 96% 96%    Exam:  Constitutional.  Appears comfortable comfortable. Respiratory.  Clear to auscultation bilaterally.  No wheezes, rales or rhonchi.  Normal respiratory effort. Cardiovascular.  Regular rate and rhythm.  No murmur, rub or gallop.   Left hand.  Induration and eschar left lateral dorsum of the hand, exquisitely tender to touch.  Indurated.  Unable to actively extend left pinky fully.  No pain with passive movement of the pinky.  Perfusion appears grossly intact. Psychiatric.  Grossly normal mood and affect.  Speech fluent and appropriate.  I have personally reviewed the following:   Today's Data  . CBG 200-300s  Scheduled Meds: . apixaban  5 mg Oral BID  . atorvastatin  20 mg Oral  Daily  . carvedilol  25 mg Oral BID  . digoxin  0.125 mg Oral Daily  . furosemide  40 mg Oral Daily  . insulin aspart  0-15 Units Subcutaneous TID WC  . insulin aspart  0-5 Units Subcutaneous QHS  . insulin aspart  6 Units Subcutaneous TID WC  . insulin detemir  18 Units Subcutaneous QHS  . oxyCODONE-acetaminophen  1 tablet Oral Q6H  . sacubitril-valsartan  1 tablet Oral BID  . spironolactone  12.5 mg Oral  Daily   Continuous Infusions: . sodium chloride 10 mL (08/26/19 1647)  . cefTRIAXone (ROCEPHIN)  IV 1 g (08/28/19 1850)  . vancomycin 1,000 mg (08/29/19 0533)    Principal Problem:   Cellulitis Active Problems:   Essential hypertension   Insulin dependent type 2 diabetes mellitus (HCC)   Cardiomyopathy (HCC)   AF (paroxysmal atrial fibrillation) (HCC)   LOS: 3 days   How to contact the [facility] Attending or Consulting provider 7A - 7P or covering provider during after hours 7P -7A, for this patient?  1. Check the care team in [facility] and look for a) attending/consulting [facility] provider listed and b) the [facility] team listed 2. Log into [email or web address] and use [facility]'s universal password to access. If you do not have the password, please contact the hospital operator. 3. Locate the [facility] provider you are looking for under [facility] and page to a number that you can be directly reached. 4. If you still have difficulty reaching the provider, please page the [facility] (Director on Call) for the Hospitalists listed on amion for assistance.

## 2019-08-29 NOTE — TOC Initial Note (Addendum)
Transition of Care [facility]) - Initial/Assessment Note    Patient Details  Name: David [name] MRN: [id number] Date of Birth: 10-Mar-1961  [facility]) CM/SW Contact:    Lennart Pall, LCSW Phone Number: 08/29/2019, 9:54 AM  Clinical Narrative:  Referral received to assist pt with medication (uninsured).  Able to confirm with pt that he has been seen at [facility] and has been able to get his meds from that pharmacy.  Explains that the concern is that he will "run out of my meds in 3 months and they won't refill them if I don't see the other doctors first (specialists).  I can't afford to privately pay for those doctor visits!"  Pt very frustrated with this situation as well as the documentation he is required to provide to [facility] to continue to get assistance from them.    With pt's agreement, I did reach out to CM at [facility], Eden Lathe, to discuss the situation.  She notes that pt has not been seen at [facility] in a little over a year.  They are happy to continue to see him and assist with meds, however, he will need to make a follow up appt ASAP with their MD provider.  They can also review his financials further to see any additional assistance that may be available - especially for medications- however, it will require more financial documentation.   Will review the above with pt (currently with MD) -               5/28: Reviewed all above with pt.  Expected Discharge Plan: Home/Self Care Barriers to Discharge: Continued Medical Work up   Patient Goals and CMS Choice Patient states their goals for this hospitalization and ongoing recovery are:: Go Home      Expected Discharge Plan and Services Expected Discharge Plan: Home/Self Care In-house Referral: Clinical Social Work     Living arrangements for the past 2 months: Single Family Home                                      Prior Living Arrangements/Services Living arrangements for the  past 2 months: Single Family Home Lives with:: Self Patient language and need for interpreter reviewed:: Yes Do you feel safe going back to the place where you live?: Yes      Need for Family Participation in Patient Care: No (Comment) Care giver support system in place?: No (comment)   Criminal Activity/Legal Involvement Pertinent to Current Situation/Hospitalization: No - Comment as needed  Activities of Daily Living Home Assistive Devices/Equipment: CBG Meter, Other (Comment)(blood pressure cuff) ADL Screening (condition at time of admission) Patient's cognitive ability adequate to safely complete daily activities?: Yes Is the patient deaf or have difficulty hearing?: No Does the patient have difficulty seeing, even when wearing glasses/contacts?: No Does the patient have difficulty concentrating, remembering, or making decisions?: No Patient able to express need for assistance with ADLs?: Yes Does the patient have difficulty dressing or bathing?: No Independently performs ADLs?: Yes (appropriate for developmental age) Does the patient have difficulty walking or climbing stairs?: No Weakness of Legs: None Weakness of Arms/Hands: Left(left hand weak)  Permission Sought/Granted Permission sought to share information with : Other (comment)([facility] CM)    Share Information with NAME: Eden Lathe  Permission granted to share info w AGENCY: [facility]  Permission granted to share info w Contact Information: [phone]  Emotional Assessment Appearance:: Appears stated age Attitude/Demeanor/Rapport: Complaining, Guarded Affect (typically observed): Frustrated Orientation: : Oriented to Self, Oriented to Place, Oriented to  Time, Oriented to Situation Alcohol / Substance Use: Not Applicable Psych Involvement: No (comment)  Admission diagnosis:  Cellulitis [L03.90] Cellulitis, unspecified cellulitis site [L03.90] Patient Active Problem List   Diagnosis Date  Noted  . Cellulitis 08/26/2019  . AF (paroxysmal atrial fibrillation) (HCC) 08/26/2019  . Heart failure (HCC) 10/18/2018  . Transaminitis 10/17/2018  . Hyperbilirubinemia 10/17/2018  . Leukocytosis 10/17/2018  . Sinusitis 10/17/2018  . Chronic anticoagulation 09/30/2017  . Smoker 09/30/2017  . Cardiomyopathy (HCC) 09/30/2017  . Sleep apnea suspected 09/30/2017  . Essential hypertension 09/26/2017  . Insulin dependent type 2 diabetes mellitus (HCC) 09/26/2017  . Atrial fibrillation with RVR (HCC) 09/26/2017   PCP:  Claiborne Rigg, NP Pharmacy:   [facility] [place], [place] - [place] [address] [place] [address] Phone: [phone] Fax: [phone]     Social Determinants of Health (SDOH) Interventions    Readmission Risk Interventions Readmission Risk Prevention Plan 08/29/2019  Medication Screening Complete  Transportation Screening Complete  Some recent data might be hidden

## 2019-08-29 NOTE — Consult Note (Signed)
[facility]       Reason for Consult: cellulitis    Referring Physician: Dr. Irene Limbo  Principal Problem:   Cellulitis Active Problems:   Essential hypertension   Insulin dependent type 2 diabetes mellitus (HCC)   Cardiomyopathy (HCC)   AF (paroxysmal atrial fibrillation) (HCC)   . atorvastatin  20 mg Oral Daily  . carvedilol  25 mg Oral BID  . digoxin  0.125 mg Oral Daily  . furosemide  40 mg Oral Daily  . insulin aspart  0-15 Units Subcutaneous TID WC  . insulin aspart  0-5 Units Subcutaneous QHS  . insulin aspart  6 Units Subcutaneous TID WC  . insulin detemir  18 Units Subcutaneous QHS  . sacubitril-valsartan  1 tablet Oral BID  . spironolactone  12.5 mg Oral Daily    Recommendations: I will change to Teflaro Hand/orthopedics evaluation as requested   Assessment: He has a hand infection that seems to be improving but still with significant pain and swelling of the left 5th digit. MRI findings reviewed and no abscess but significant inflammation around the muscle and tendon.    Antibiotics: Vancomycin and ceftriaxone  HPI: David [name] is a 59 y.o. male with history of A fib on Eliquis, cardiomyopathy, DM 2 on insulin who came in with swelling of his left hand.  Noted erythema, tenderness, warmth.  Had a recent spider bite.  Some improvement in the erythema overall but the 5th digit seems worse, more pain and less movement.  No associated fever or chills. Started on vancomycin and ceftriaxone.     Review of Systems:  Constitutional: negative for fevers, chills, anorexia and weight loss Gastrointestinal: negative for nausea and diarrhea Integument/breast: negative for rash All other systems reviewed and are negative    Past Medical History:  Diagnosis Date  . Cardiomyopathy (HCC)    a. EF 45% in 2019.  Marland Kitchen Chronic anticoagulation 09/30/2017  . Diabetes mellitus type 2 in nonobese (HCC)   . Does not have health insurance   .  Essential hypertension 09/26/2017  . Financial difficulties   . H/O noncompliance with medical treatment, presenting hazards to health   . Non-insulin treated type 2 diabetes mellitus (HCC) 09/26/2017  . Persistent atrial fibrillation (HCC)   . Sleep apnea suspected 09/30/2017    Social History   Tobacco Use  . Smoking status: Former Smoker    Years: 15.00    Types: Cigarettes    Quit date: 03/18/2018    Years since quitting: 1.4  . Smokeless tobacco: Never Used  . Tobacco comment: 09/26/2017 "2-3 cigarettes/month now"  Substance Use Topics  . Alcohol use: Yes    Alcohol/week: 3.0 standard drinks    Types: 3 Cans of beer per week  . Drug use: Never    Family History  Problem Relation Age of Onset  . Hypertension Mother     No Known Allergies  Physical Exam: Constitutional: in no apparent distress  Vitals:   08/29/19 1127 08/29/19 1331  BP:  122/81  Pulse: 70 73  Resp:    Temp:  (!) 97.5 F (36.4 C)  SpO2:  97%   EYES: anicteric Cardiovascular: Cor RRR Respiratory: clear; Musculoskeletal: left 5th hand digit with swelling, erythema, minimal warmth; decreased rom.  + tenderness.  Skin: negatives: no rash  Lab Results  Component Value Date   WBC 9.9 08/27/2019   HGB 13.8 08/27/2019   HCT 39.8 08/27/2019   MCV 94.5 08/27/2019   PLT  192 08/27/2019    Lab Results  Component Value Date   CREATININE 0.97 08/29/2019   BUN 18 08/29/2019   NA 132 (L) 08/29/2019   K 3.6 08/29/2019   CL 97 (L) 08/29/2019   CO2 23 08/29/2019    Lab Results  Component Value Date   ALT 44 10/18/2018   AST 26 10/18/2018   ALKPHOS 79 10/18/2018     Microbiology: Recent Results (from the past 240 hour(s))  Culture, blood (Routine X 2) w Reflex to ID Panel     Status: None (Preliminary result)   Collection Time: 08/26/19  3:52 PM   Specimen: BLOOD  Result Value Ref Range Status   Specimen Description   Final    BLOOD RIGHT ANTECUBITAL Performed at [facility], [place] [address] [place], [place] [address]    Special Requests   Final    BOTTLES DRAWN AEROBIC AND ANAEROBIC Blood Culture results may not be optimal due to an inadequate volume of blood received in culture bottles Performed at [place] [address], [place], [place] [address]    Culture   Final    NO GROWTH 3 DAYS Performed at [facility], [place] [address], [place], [place] [address]    Report Status PENDING  Incomplete  Culture, blood (Routine X 2) w Reflex to ID Panel     Status: None (Preliminary result)   Collection Time: 08/26/19  3:57 PM   Specimen: BLOOD  Result Value Ref Range Status   Specimen Description   Final    BLOOD RIGHT ANTECUBITAL Performed at [facility] [address] [place], [place] [address]    Special Requests   Final    BOTTLES DRAWN AEROBIC AND ANAEROBIC Blood Culture adequate volume Performed at [facility] [address] [place], [place] [address]    Culture   Final    NO GROWTH 3 DAYS Performed at [facility], [place] [address], [place], [place] [address]    Report Status PENDING  Incomplete  SARS Coronavirus 2 by RT PCR (hospital order, performed in [facility] hospital lab) Nasopharyngeal Nasopharyngeal Swab     Status: None   Collection Time: 08/26/19  4:38 PM   Specimen: Nasopharyngeal Swab  Result Value Ref Range Status   SARS Coronavirus 2 NEGATIVE NEGATIVE Final    Comment: (NOTE) SARS-CoV-2 target nucleic acids are NOT DETECTED. The SARS-CoV-2 RNA is generally detectable in upper and lower respiratory specimens during the acute phase of infection. The lowest concentration of SARS-CoV-2 viral copies this assay can detect is 250 copies / mL. A negative result does not preclude SARS-CoV-2 infection and should not be used as the sole basis for treatment or other patient management decisions.  A negative result may occur with improper specimen collection  / handling, submission of specimen other than nasopharyngeal swab, presence of viral mutation(s) within the areas targeted by this assay, and inadequate number of viral copies (<250 copies / mL). A negative result must be combined with clinical observations, patient history, and epidemiological information. Fact Sheet for Patients:   StrictlyIdeas.no Fact Sheet for Healthcare Providers: BankingDealers.co.za This test is not yet approved or cleared  by the [place] FDA and has been authorized for detection and/or diagnosis of SARS-CoV-2 by FDA under an Emergency Use Authorization (EUA).  This EUA will remain in effect (meaning this test can be used) for the duration of the COVID-19 declaration under Section 564(b)(1)  of the Act, 21 U.S.C. section 360bbb-3(b)(1), unless the authorization is terminated or revoked sooner. Performed at [facility], [address] [place], [place] [address]     Gardiner Barefoot, MD [facility] [email or web address] 08/29/2019, 2:14 PM

## 2019-08-29 NOTE — Consult Note (Signed)
Reason for Consult:Left hand cellulitis Referring Physician: [name] is an 59 y.o. male.  HPI: David [name] woke up Sunday with swelling, redness and pain to his left hand. He denies any precipitating event. He tried to treat this at home but it continued to worsen and he came to the ED and was admitted 5/24. At that time he noted swelling and redness over whole hand with streaking onto the forearm. Over the last 3d he has shown improvement in both the erythema and swelling though the pain is still quite severe. He feels like nothing is getting better though. He is R[employer] and works as a Investment banker, operational.  Past Medical History:  Diagnosis Date  . Cardiomyopathy (HCC)    a. EF 45% in 2019.  Marland Kitchen Chronic anticoagulation 09/30/2017  . Diabetes mellitus type 2 in nonobese (HCC)   . Does not have health insurance   . Essential hypertension 09/26/2017  . Financial difficulties   . H/O noncompliance with medical treatment, presenting hazards to health   . Non-insulin treated type 2 diabetes mellitus (HCC) 09/26/2017  . Persistent atrial fibrillation (HCC)   . Sleep apnea suspected 09/30/2017    Past Surgical History:  Procedure Laterality Date  . APPENDECTOMY  1971  . CARDIOVERSION N/A 09/28/2017   Procedure: CARDIOVERSION;  Surgeon: Thurmon Fair, MD;  Location: [facility] ENDOSCOPY;  Service: Cardiovascular;  Laterality: N/A;  . TEE WITHOUT CARDIOVERSION N/A 09/28/2017   Procedure: TRANSESOPHAGEAL ECHOCARDIOGRAM (TEE);  Surgeon: Thurmon Fair, MD;  Location: [facility] ENDOSCOPY;  Service: Cardiovascular;  Laterality: N/A;    Family History  Problem Relation Age of Onset  . Hypertension Mother     Social History:  reports that he quit smoking about 17 months ago. His smoking use included cigarettes. He quit after 15.00 years of use. He has never used smokeless tobacco. He reports current alcohol use of about 3.0 standard drinks of alcohol per week. He reports that he does not use drugs.  Allergies: No Known  Allergies  Medications: I have reviewed the patient's current medications.  Results for orders placed or performed during the hospital encounter of 08/26/19 (from the past 48 hour(s))  Glucose, capillary     Status: Abnormal   Collection Time: 08/27/19  4:35 PM  Result Value Ref Range   Glucose-Capillary 228 (H) 70 - 99 mg/dL    Comment: Glucose reference range applies only to samples taken after fasting for at least 8 hours.  Glucose, capillary     Status: Abnormal   Collection Time: 08/27/19  9:27 PM  Result Value Ref Range   Glucose-Capillary 222 (H) 70 - 99 mg/dL    Comment: Glucose reference range applies only to samples taken after fasting for at least 8 hours.  Basic metabolic panel     Status: Abnormal   Collection Time: 08/28/19  4:31 AM  Result Value Ref Range   Sodium 133 (L) 135 - 145 mmol/L   Potassium 3.6 3.5 - 5.1 mmol/L   Chloride 99 98 - 111 mmol/L   CO2 25 22 - 32 mmol/L   Glucose, Bld 232 (H) 70 - 99 mg/dL    Comment: Glucose reference range applies only to samples taken after fasting for at least 8 hours.   BUN 14 6 - 20 mg/dL   Creatinine, Ser 5.40 0.61 - 1.24 mg/dL   Calcium 9.0 8.9 - 08.6 mg/dL   GFR calc non Af Amer >60 >60 mL/min   GFR calc Af Amer >60 >60 mL/min  Anion gap 9 5 - 15    Comment: Performed at [facility], [place] [address] [place], [place] [address]  Glucose, capillary     Status: Abnormal   Collection Time: 08/28/19  7:35 AM  Result Value Ref Range   Glucose-Capillary 255 (H) 70 - 99 mg/dL    Comment: Glucose reference range applies only to samples taken after fasting for at least 8 hours.  Glucose, capillary     Status: Abnormal   Collection Time: 08/28/19 11:30 AM  Result Value Ref Range   Glucose-Capillary 310 (H) 70 - 99 mg/dL    Comment: Glucose reference range applies only to samples taken after fasting for at least 8 hours.  Glucose, capillary     Status: Abnormal   Collection Time: 08/28/19  4:23 PM  Result  Value Ref Range   Glucose-Capillary 204 (H) 70 - 99 mg/dL    Comment: Glucose reference range applies only to samples taken after fasting for at least 8 hours.  Glucose, capillary     Status: Abnormal   Collection Time: 08/28/19  8:45 PM  Result Value Ref Range   Glucose-Capillary 262 (H) 70 - 99 mg/dL    Comment: Glucose reference range applies only to samples taken after fasting for at least 8 hours.  Basic metabolic panel     Status: Abnormal   Collection Time: 08/29/19  5:01 AM  Result Value Ref Range   Sodium 132 (L) 135 - 145 mmol/L   Potassium 3.6 3.5 - 5.1 mmol/L   Chloride 97 (L) 98 - 111 mmol/L   CO2 23 22 - 32 mmol/L   Glucose, Bld 262 (H) 70 - 99 mg/dL    Comment: Glucose reference range applies only to samples taken after fasting for at least 8 hours.   BUN 18 6 - 20 mg/dL   Creatinine, Ser 0.97 0.61 - 1.24 mg/dL   Calcium 8.7 (L) 8.9 - 10.3 mg/dL   GFR calc non Af Amer >60 >60 mL/min   GFR calc Af Amer >60 >60 mL/min   Anion gap 12 5 - 15    Comment: Performed at [facility], [place] [address], [place], [place] [address]  Glucose, capillary     Status: Abnormal   Collection Time: 08/29/19  7:28 AM  Result Value Ref Range   Glucose-Capillary 316 (H) 70 - 99 mg/dL    Comment: Glucose reference range applies only to samples taken after fasting for at least 8 hours.  Glucose, capillary     Status: Abnormal   Collection Time: 08/29/19 11:50 AM  Result Value Ref Range   Glucose-Capillary 211 (H) 70 - 99 mg/dL    Comment: Glucose reference range applies only to samples taken after fasting for at least 8 hours.  Glucose, capillary     Status: Abnormal   Collection Time: 08/29/19 12:34 PM  Result Value Ref Range   Glucose-Capillary 180 (H) 70 - 99 mg/dL    Comment: Glucose reference range applies only to samples taken after fasting for at least 8 hours.    MR HAND LEFT W WO CONTRAST  Result Date: 08/29/2019 CLINICAL DATA:  Spider bite on dorsal and  ulnar of the hand. Persistent pain and swelling. EXAM: MRI OF THE LEFT HAND WITHOUT AND WITH CONTRAST TECHNIQUE: Multiplanar, multisequence MR imaging of the left hand was performed before and after the administration of intravenous contrast. CONTRAST:  82mL GADAVIST GADOBUTROL 1 MMOL/ML IV SOLN COMPARISON:  Radiographs 08/26/2019 FINDINGS:  Diffuse subcutaneous soft tissue swelling/edema/fluid mainly along the dorsal and ulnar aspect of the hand suggesting cellulitis. No discrete rim enhancing soft tissue abscess is identified. There is moderate inflammation/fluid enhancement around the distal aspect of the abductor digiti minimi and around its tendinous attachment. No findings for pyomyositis. Mild degenerative changes noted at the second and third MTP joints but no findings suspicious for septic arthritis. The wrist also demonstrates mild degenerative changes but no acute bony findings. The dorsal and volar hand tendons are intact. IMPRESSION: 1. Diffuse subcutaneous soft tissue swelling/edema/fluid mainly along the dorsal and ulnar aspect of the hand suggesting cellulitis. No discrete rim enhancing soft tissue abscess. 2. Moderate inflammation/fluid enhancement around the distal aspect of the adductor digiti minimi and around its tendinous attachment. No findings for pyomyositis. 3. Mild degenerative changes at the second and third MTP joints but no findings suspicious for septic arthritis or osteomyelitis. Electronically Signed   By: Rudie Meyer M.D.   On: 08/29/2019 08:22    Review of Systems  Constitutional: Negative for chills, diaphoresis and fever.  HENT: Negative for ear discharge, ear pain, hearing loss and tinnitus.   Eyes: Negative for photophobia and pain.  Respiratory: Negative for cough and shortness of breath.   Cardiovascular: Negative for chest pain.  Gastrointestinal: Negative for abdominal pain, nausea and vomiting.  Genitourinary: Negative for dysuria, flank pain, frequency and  urgency.  Musculoskeletal: Positive for arthralgias (Left hand). Negative for back pain, myalgias and neck pain.  Neurological: Negative for dizziness and headaches.  Hematological: Does not bruise/bleed easily.  Psychiatric/Behavioral: The patient is not nervous/anxious.    Blood pressure 122/81, pulse 73, temperature (!) 97.5 F (36.4 C), temperature source Oral, resp. rate 18, height 6' (1.829 m), weight 81.6 kg, SpO2 97 %. Physical Exam  Constitutional: He appears well-developed and well-nourished. No distress.  HENT:  Head: Normocephalic and atraumatic.  Eyes: Conjunctivae are normal. Right eye exhibits no discharge. Left eye exhibits no discharge. No scleral icterus.  Cardiovascular: Normal rate and regular rhythm.  Respiratory: Effort normal. No respiratory distress.  Musculoskeletal:     Cervical back: Normal range of motion.     Comments: UEx shoulder, elbow, wrist, digits- Small scabbed area dorsum over 5th MC, severe TTP ulnar aspect of hand both dorsal and palmar, unable to actively extend or flex little finger but no pain with PROM and good strenght, some paresthesias little finger, no instability, no blocks to motion  Sens  Ax/R/M/U intact  Mot   Ax/ R/ PIN/ M/ AIN/ U intact  Rad 2+  Neurological: He is alert.  Skin: Skin is warm and dry. He is not diaphoretic.  Psychiatric: He has a normal mood and affect. His behavior is normal.    Assessment/Plan: Left hand pain -- The MRI and exam are both reassuring and do not provide a clear cut case for surgical intervention. I would advise waiting another 24h to see if he continues to improve on supportive medical therapy. Dr. Roney Mans to evaluate today as well.  Multiple medical problems including afib on Eliquis, cardiomyopathy, and DM -- per primary service    Freeman Caldron, PA-C Orthopedic Surgery [phone] 08/29/2019, 2:18 PM

## 2019-08-29 NOTE — Op Note (Signed)
PREOPERATIVE DIAGNOSIS: Left dorsal hand abscess  POSTOPERATIVE DIAGNOSIS: Same  ATTENDING PHYSICIAN: Gasper Lloyd. Roney Mans, III, MD who was present and scrubbed for the entire case   ASSISTANT SURGEON: None.   ANESTHESIA: Local anesthesia for bedside procedure  SURGICAL PROCEDURES: Bedside I&D of left hand dorsal abscess  SURGICAL INDICATIONS: Patient is a 60 year old male who has been having a 3-day history of left dorsal hand pain, swelling and redness. He was admitted to the medicine service earlier this week and placed on IV antibiotics. He had significant swelling throughout the hand which had improved over the past several days. Despite this he had continued pain over the dorsum of the small finger MP joint and an MRI was obtained. These MRI results were somewhat inconclusive but on his exam he had a slight amount of fluctuance over the dorsum of the small finger MP joint. We discussed treatment options the patient did wish to proceed forward with a bedside I&D of the dorsum of his left hand.  FINDING: A 1 cm incision was made over the dorsal aspect of the fifth metacarpal just proximal to the MP joint. A small amount of purulent material was expressed through the incision and cultures were taken. The wound was packed open.  DESCRIPTION OF PROCEDURE: Verbal consent was obtained for a left hand I&D. We discussed the risk and benefits of the procedure which include but are not limited to infection, bleeding, damage to surrounding structures including blood vessels and nerves, pain, stiffness, recurrence and need for additional procedures. The patient did wish to proceed. 1% lidocaine was then injected in the dorsum of the hand circumferentially around the swollen and tender area proximal to the fifth MP joint. This provided full anesthetic to the procedure area.  A semisterile prep was performed using Betadine over the affected area. An 11 blade was then used to make a small, 1 cm incision  proximal to the fifth MP joint dorsally. Blunt dissection was performed through the subcutaneous tissue and there was a blush of purulent material. This material was cultured for both aerobic and anaerobic bacteria. Continued blunt dissection was performed through the area utilizing a hemostat. No further areas of purulence were noted. Following this the patient was able to actively flex and extend the small finger and near full range of motion. Wound was copiously irrigated with normal saline utilizing a syringe. Iodoform packing was placed within the wound and the wound was dressed with 4 x 4's and well-padded soft dressing. Patient tolerated the procedure well and there are no complications.  ESTIMATED BLOOD LOSS: Minimal  TOURNIQUET TIME: None  SPECIMENS: Aerobic and anaerobic culture x1  POSTOPERATIVE PLAN: Patient can continue inpatient care with IV antibiotics. We'll follow-up his cultures. We will plan on pulling his packing in 1 to 2 days pending his clinical progression. He can continue working on range of motion exercises as his pain improves and allows.  IMPLANTS: None

## 2019-08-29 NOTE — Progress Notes (Signed)
Pharmacy Antibiotic Note  David [name] is a 59 y.o. male admitted on 08/26/2019 with left hand wound infection. Pharmacy has been consulted for Vancomycin dosing.  Plan:  Continue vancomycin 1000 mg IV q12h (AUC 417 w/ SCr 0.97; Vd 0.72)  D/t shortage of reagent for vanc levels, may need to consider switching vancomycin to Zyvox if need for MRSA coverage persists  Rocephin per MD, dosing appropriate  Height: 6' (182.9 cm) Weight: 81.6 kg (180 lb) IBW/kg (Calculated) : 77.6  Temp (24hrs), Avg:98.1 F (36.7 C), Min:97.6 F (36.4 C), Max:98.9 F (37.2 C)  Recent Labs  Lab 08/26/19 1634 08/26/19 2015 08/26/19 2303 08/27/19 0459 08/27/19 0726 08/28/19 0431 08/29/19 0501  WBC 8.9  --   --   --  9.9  --   --   CREATININE 1.20  --   --  1.06  --  1.05 0.97  LATICACIDVEN 2.8* 1.7 1.9  --   --   --   --     Estimated Creatinine Clearance: 92.2 mL/min (by C-G formula based on SCr of 0.97 mg/dL).    No Known Allergies  Antimicrobials this admission: 5/24 Ceftriaxone >>   5/24 Vanc >>    Dose adjustments this admission:  Microbiology results: 5/24 BCx: ngtd 5/24 Covid-19: negative  Thank you for allowing pharmacy to be a part of this patient's care.  WOFFORD, Deirdre Evener, PharmD 08/29/2019 11:39 AM

## 2019-08-29 NOTE — Progress Notes (Signed)
Inpatient Diabetes Program Recommendations  AACE/ADA: New Consensus Statement on Inpatient Glycemic Control (2015)  Target Ranges:  Prepandial:   less than 140 mg/dL      Peak postprandial:   less than 180 mg/dL (1-2 hours)      Critically ill patients:  140 - 180 mg/dL   Lab Results  Component Value Date   GLUCAP 316 (H) 08/29/2019   HGBA1C 9.7 (H) 08/26/2019    Review of Glycemic Control  Diabetes history: DM2 Outpatient Diabetes medications: Novolog 6 units qid, Levemir 14 units QHS (not taking) Current orders for Inpatient glycemic control: Levemir 18 units QHS, Novolog 0-15 units tidwc and hs + 6 units tidwc  HgbA1C - 9.7%  Inpatient Diabetes Program Recommendations:     Increase Levemir to 20 units QHS Increase Novolog to 8 units tidwc  Follow closely.  Thank you. Ailene Ards, RD, LDN, CDE Inpatient Diabetes Coordinator [phone]

## 2019-08-30 DIAGNOSIS — L02512 Cutaneous abscess of left hand: Secondary | ICD-10-CM

## 2019-08-30 LAB — BASIC METABOLIC PANEL
Anion gap: 10 (ref 5–15)
BUN: 22 mg/dL — ABNORMAL HIGH (ref 6–20)
CO2: 23 mmol/L (ref 22–32)
Calcium: 8.7 mg/dL — ABNORMAL LOW (ref 8.9–10.3)
Chloride: 101 mmol/L (ref 98–111)
Creatinine, Ser: 1.19 mg/dL (ref 0.61–1.24)
GFR calc Af Amer: >60 mL/min (ref >60)
GFR calc non Af Amer: >60 mL/min (ref >60)
Glucose, Bld: 255 mg/dL — ABNORMAL HIGH (ref 70–99)
Potassium: 3.5 mmol/L (ref 3.5–5.1)
Sodium: 134 mmol/L — ABNORMAL LOW (ref 135–145)

## 2019-08-30 LAB — GLUCOSE, CAPILLARY
Glucose-Capillary: 223 mg/dL — ABNORMAL HIGH (ref 70–99)
Glucose-Capillary: 189 mg/dL — ABNORMAL HIGH (ref 70–99)
Glucose-Capillary: 199 mg/dL — ABNORMAL HIGH (ref 70–99)

## 2019-08-30 MED ORDER — CEPHALEXIN 500 MG PO CAPS: 500.0000 mg | ORAL_CAPSULE | Freq: Four times a day (QID) | ORAL | 0 refills | Status: AC

## 2019-08-30 MED ORDER — IBUPROFEN 200 MG PO TABS: 400.0000 mg | ORAL_TABLET | Freq: Four times a day (QID) | ORAL | 0 refills | Status: DC | PRN

## 2019-08-30 MED ORDER — SULFAMETHOXAZOLE-TRIMETHOPRIM 800-160 MG PO TABS
1.0000 | ORAL_TABLET | Freq: Two times a day (BID) | ORAL | 0 refills | Status: DC
Start: 2019-08-30 — End: 2019-09-25

## 2019-08-30 MED ORDER — INSULIN DETEMIR 100 UNIT/ML FLEXPEN: 14.0000 [IU] | PEN_INJECTOR | Freq: Every day | SUBCUTANEOUS | 0 refills | Status: DC

## 2019-08-30 MED ORDER — OXYCODONE-ACETAMINOPHEN 5-325 MG PO TABS: 1.0000 | ORAL_TABLET | Freq: Four times a day (QID) | ORAL | 0 refills | Status: DC | PRN

## 2019-08-30 MED FILL — CEPHALEXIN 500 MG CAPSULE: 500 | 14 days supply | Qty: 56 | Fill #0

## 2019-08-30 MED FILL — SULFAMETHOXAZOLE-TMP DS TAB: 800-160 | 14 days supply | Qty: 28 | Fill #0

## 2019-08-30 MED FILL — LEVEMIR FLEXTOUCH 100 UNITS: 100 | 21 days supply | Qty: 3 | Fill #0

## 2019-08-30 NOTE — Progress Notes (Signed)
The patient is alert and oriented and has been seen by his physician. The orders for discharge were written. IV has been removed. Went over discharge instructions with patient. Nurse tech is walking patient down to his ride with all of his belongings.   

## 2019-08-30 NOTE — Discharge Summary (Signed)
Physician Discharge Summary  David [name] EQA:[id number] DOB: March 27, 1961 DOA: 08/26/2019  PCP: Claiborne Rigg, NP  Admit date: 08/26/2019 Discharge date: 08/30/2019  Recommendations for Outpatient Follow-up:   Left hand cellulitis, patient reportedly had a spider bite. --Dr. Roney Mans will arrange outpatient follow-up for wound check and wound care next week. --Recommends 4 x 4 gauze, Ace wrap, change daily, do not get wet   Follow-up Information    Claiborne Rigg, NP Follow up.   Specialty: Nurse Practitioner Contact information: [address] [place] [address] [phone]        Wendall Stade, MD .   Specialty: Cardiology Contact information: [phone] [address] [place] [address] [phone]        Ernest Mallick, MD. Call.   Why: Office wants to see you next week for therapy and follow-up check Contact information: [address] [place] [address] [place] [address] [phone]            Discharge Diagnoses: Principal diagnosis is #1 1. Left hand cellulitis and abscess 2. Diabetes mellitus type 2 on insulin. 3. Mild hyponatremia, asymptomatic 4. Atrial fibrillation 5. Chronic systolic CHF, LVEF 35-40% cardiogram July 2020.  Discharge Condition: improved Disposition: home  Diet recommendation: heart healthy, diabetic diet  Filed Weights   08/26/19 1854  Weight: 81.6 kg    History of present illness:  59 year old man PMH including diabetes mellitus type 2 on insulin, atrial fibrillation on apixaban, presented with left hand cellulitis which he thinks was secondary to brown recluse bite  Hospital Course:  Patient was treated conservatively with IV antibiotics but failed to have significant improvement, MRI was equivocal and patient was seen by hand surgery, underwent bedside incision and drainage with rapid clinical improvement thereafter.  I discussed the case with Dr. Roney Mans hand surgery today who recommended  discharge home and he will arrange outpatient follow-up next week as well as outpatient wound therapy in his office.  Antibiotics as per infectious disease.  I discussed wound care recommendations extensively with the patient and requested nurse remove packing prior to discharge as instructed by hand surgery.  Left hand cellulitis, patient reportedly had a spider bite. --Dr. Roney Mans will arrange outpatient follow-up for wound check and wound care next week. --Recommends 4 x 4 gauze, Ace wrap, change daily, do not get wet  Diabetes mellitus type 2 on insulin.  Hemoglobin A1c 9.7. --Remained stable.  Patient had run out of Levemir, was given a new prescription.  Mild hyponatremia, asymptomatic.  Suspect diuretic related.    Atrial fibrillation --Remained stable.  Continue apixaban, carvedilol, digoxin  Chronic systolic CHF, LVEF 35-40% cardiogram July 2020. --Remained stable. Continue carvedilol, digoxin, furosemide, spironolactone  Consults:  . Hand surgery . ID   Procedures:  . Bedside I&D left hand 5/27  Antimicrobials:  . Discharge in 2 weeks Bactrim and Keflex  Today's assessment: S: Feeling much better today.  Much less pain in his left hand.  Ready to go home. O: Vitals:  Vitals:   08/30/19 0807 08/30/19 1330  BP:  121/90  Pulse: 62 66  Resp:  19  Temp:  98.1 F (36.7 C)  SpO2:  97%    Constitutional:  . Appears calm and comfortable Respiratory:  . CTA bilaterally, no w/r/r.  . Respiratory effort normal.  Cardiovascular:  . RRR, no m/r/g Musculoskeletal:  . Left hand wrapped.  Not able to examine wound. Psychiatric:  . judgement and insight appear normal . Mental status  o Mood, affect appropriate  BMP unremarkable  Discharge Instructions  Discharge Instructions    Diet - low sodium heart healthy   Complete by: As directed    Diet Carb Modified   Complete by: As directed    Discharge wound care:   Complete by: As directed    Keep hand  elevated, change dressing daily with 4x4 gauze and ACE wrap to keep clean and dry. Do not get wet. Do not submerge.   Increase activity slowly   Complete by: As directed      Allergies as of 08/30/2019   No Known Allergies     Medication List    STOP taking these medications   guaiFENesin-dextromethorphan 100-10 MG/5ML syrup Commonly known as: ROBITUSSIN DM   Insulin Pen Needle 32G X 4 MM Misc     TAKE these medications   apixaban 5 MG Tabs tablet Commonly known as: ELIQUIS Take 1 tablet (5 mg total) by mouth 2 (two) times daily.   atorvastatin 20 MG tablet Commonly known as: LIPITOR Take 1 tablet (20 mg total) by mouth daily.   carvedilol 25 MG tablet Commonly known as: COREG Take 1 tablet (25 mg total) by mouth 2 (two) times daily.   cephALEXin 500 MG capsule Commonly known as: KEFLEX Take 1 capsule (500 mg total) by mouth 4 (four) times daily for 14 days.   digoxin 0.125 MG tablet Commonly known as: LANOXIN Take 1 tablet (0.125 mg total) by mouth daily.   furosemide 40 MG tablet Commonly known as: Lasix Take 1 tablet (40 mg total) by mouth daily.   ibuprofen 200 MG tablet Commonly known as: ADVIL Take 2 tablets (400 mg total) by mouth every 6 (six) hours as needed for mild pain. What changed: reasons to take this   insulin aspart 100 UNIT/ML FlexPen Commonly known as: NOVOLOG Inject 4 Units into the skin 3 (three) times daily with meals. What changed:   how much to take  when to take this   insulin detemir 100 UNIT/ML FlexPen Commonly known as: LEVEMIR Inject 14 Units into the skin at bedtime.   oxyCODONE-acetaminophen 5-325 MG tablet Commonly known as: PERCOCET/ROXICET Take 1 tablet by mouth every 6 (six) hours as needed for moderate pain or severe pain.   sacubitril-valsartan 24-26 MG Commonly known as: ENTRESTO Take 1 tablet by mouth 2 (two) times daily.   sodium chloride 0.65 % Soln nasal spray Commonly known as: OCEAN Place 1 spray into  both nostrils as needed for congestion.   spironolactone 25 MG tablet Commonly known as: ALDACTONE Take 0.5 tablets (12.5 mg total) by mouth daily.   sulfamethoxazole-trimethoprim 800-160 MG tablet Commonly known as: BACTRIM DS Take 1 tablet by mouth 2 (two) times daily.            Discharge Care Instructions  (From admission, onward)         Start     Ordered   08/30/19 0000  Discharge wound care:    Comments: Keep hand elevated, change dressing daily with 4x4 gauze and ACE wrap to keep clean and dry. Do not get wet. Do not submerge.   08/30/19 1511         No Known Allergies  The results of significant diagnostics from this hospitalization (including imaging, microbiology, ancillary and laboratory) are listed below for reference.    Significant Diagnostic Studies: MR HAND LEFT W WO CONTRAST  Result Date: 08/29/2019 CLINICAL DATA:  Spider bite on dorsal and ulnar of the hand.  Persistent pain and swelling. EXAM: MRI OF THE LEFT HAND WITHOUT AND WITH CONTRAST TECHNIQUE: Multiplanar, multisequence MR imaging of the left hand was performed before and after the administration of intravenous contrast. CONTRAST:  58mL GADAVIST GADOBUTROL 1 MMOL/ML IV SOLN COMPARISON:  Radiographs 08/26/2019 FINDINGS: Diffuse subcutaneous soft tissue swelling/edema/fluid mainly along the dorsal and ulnar aspect of the hand suggesting cellulitis. No discrete rim enhancing soft tissue abscess is identified. There is moderate inflammation/fluid enhancement around the distal aspect of the abductor digiti minimi and around its tendinous attachment. No findings for pyomyositis. Mild degenerative changes noted at the second and third MTP joints but no findings suspicious for septic arthritis. The wrist also demonstrates mild degenerative changes but no acute bony findings. The dorsal and volar hand tendons are intact. IMPRESSION: 1. Diffuse subcutaneous soft tissue swelling/edema/fluid mainly along the dorsal and  ulnar aspect of the hand suggesting cellulitis. No discrete rim enhancing soft tissue abscess. 2. Moderate inflammation/fluid enhancement around the distal aspect of the adductor digiti minimi and around its tendinous attachment. No findings for pyomyositis. 3. Mild degenerative changes at the second and third MTP joints but no findings suspicious for septic arthritis or osteomyelitis. Electronically Signed   By: Marijo Sanes M.D.   On: 08/29/2019 08:22   DG Hand Complete Left  Result Date: 08/26/2019 CLINICAL DATA:  Possible bug bite. EXAM: LEFT HAND - COMPLETE 3+ VIEW COMPARISON:  None. FINDINGS: There is no evidence of fracture or dislocation. There is no evidence of arthropathy or other focal bone abnormality. Mild diffuse dorsal soft tissue swelling is seen. IMPRESSION: Mild diffuse dorsal soft tissue swelling without evidence of acute osseous abnormality. Electronically Signed   By: Virgina Norfolk M.D.   On: 08/26/2019 19:21    Microbiology: Recent Results (from the past 240 hour(s))  Culture, blood (Routine X 2) w Reflex to ID Panel     Status: None (Preliminary result)   Collection Time: 08/26/19  3:52 PM   Specimen: BLOOD  Result Value Ref Range Status   Specimen Description   Final    BLOOD RIGHT ANTECUBITAL Performed at [facility] [address], [place], [place] [address]    Special Requests   Final    BOTTLES DRAWN AEROBIC AND ANAEROBIC Blood Culture results may not be optimal due to an inadequate volume of blood received in culture bottles Performed at [facility] [address], [place], [place] [address]    Culture   Final    NO GROWTH 4 DAYS Performed at [facility], [place] [address] [place], [place] [address]    Report Status PENDING  Incomplete  Culture, blood (Routine X 2) w Reflex to ID Panel     Status: None (Preliminary result)   Collection Time: 08/26/19  3:57 PM   Specimen: BLOOD  Result Value Ref Range Status     Specimen Description   Final    BLOOD RIGHT ANTECUBITAL Performed at [facility] [address] [place], [place] [address]    Special Requests   Final    BOTTLES DRAWN AEROBIC AND ANAEROBIC Blood Culture adequate volume Performed at [facility] [address] [place], [place] [address]    Culture   Final    NO GROWTH 4 DAYS Performed at [facility], [place] [address], [place],  [address]    Report Status PENDING  Incomplete  SARS Coronavirus 2 by RT PCR (hospital order, performed in [facility]  hospital lab) Nasopharyngeal Nasopharyngeal Swab     Status: None   Collection Time: 08/26/19  4:38 PM   Specimen: Nasopharyngeal Swab  Result Value Ref Range Status   SARS Coronavirus 2 NEGATIVE NEGATIVE Final    Comment: (NOTE) SARS-CoV-2 target nucleic acids are NOT DETECTED. The SARS-CoV-2 RNA is generally detectable in upper and lower respiratory specimens during the acute phase of infection. The lowest concentration of SARS-CoV-2 viral copies this assay can detect is 250 copies / mL. A negative result does not preclude SARS-CoV-2 infection and should not be used as the sole basis for treatment or other patient management decisions.  A negative result may occur with improper specimen collection / handling, submission of specimen other than nasopharyngeal swab, presence of viral mutation(s) within the areas targeted by this assay, and inadequate number of viral copies (<250 copies / mL). A negative result must be combined with clinical observations, patient history, and epidemiological information. Fact Sheet for Patients:   BoilerBrush.com.cy Fact Sheet for Healthcare Providers: https://pope.com/ This test is not yet approved or cleared  by the [place] FDA and has been authorized for detection and/or diagnosis of SARS-CoV-2 by FDA under an Emergency Use Authorization (EUA).   This EUA will remain in effect (meaning this test can be used) for the duration of the COVID-19 declaration under Section 564(b)(1) of the Act, 21 U.S.C. section 360bbb-3(b)(1), unless the authorization is terminated or revoked sooner. Performed at [facility], [address] [place], [place] [address]      Labs: Basic Metabolic Panel: Recent Labs  Lab 08/26/19 1634 08/27/19 0459 08/28/19 0431 08/29/19 0501 08/30/19 0445  NA 134* 132* 133* 132* 134*  K 3.9 3.7 3.6 3.6 3.5  CL 98 101 99 97* 101  CO2 25 23 25 23 23   GLUCOSE 373* 288* 232* 262* 255*  BUN 21* 14 14 18  22*  CREATININE 1.20 1.06 1.05 0.97 1.19  CALCIUM 9.4 9.0 9.0 8.7* 8.7*   CBC: Recent Labs  Lab 08/26/19 1634 08/27/19 0726  WBC 8.9 9.9  NEUTROABS 6.0  --   HGB 14.6 13.8  HCT 42.0 39.8  MCV 94.4 94.5  PLT 219 192    CBG: Recent Labs  Lab 08/29/19 1749 08/29/19 2151 08/30/19 0734 08/30/19 1150 08/30/19 1607  GLUCAP 139* 336* 223* 189* 199*    Principal Problem:   Cellulitis Active Problems:   Essential hypertension   Insulin dependent type 2 diabetes mellitus (HCC)   Cardiomyopathy (HCC)   AF (paroxysmal atrial fibrillation) (HCC)   Time coordinating discharge: 40 minutes   Signed:  09/01/19, MD  [facility]  08/30/2019, 7:21 PM

## 2019-08-30 NOTE — Progress Notes (Signed)
   Ortho Hand Progress Note  Subjective: No acute events last night. Pain in left hand improved. Denies numbness and tingling   Objective: Vital signs in last 24 hours: Temp:  [97.5 F (36.4 C)-98 F (36.7 C)] 98 F (36.7 C) (05/28 0531) Pulse Rate:  [59-75] 62 (05/28 0807) Resp:  [18] 18 (05/28 0531) BP: (122-128)/(71-96) 122/71 (05/28 0531) SpO2:  [96 %-99 %] 99 % (05/28 0531)  Intake/Output from previous day: 05/27 0701 - 05/28 0700 In: 2100.1 [P.O.:960; I.V.:737.5; IV Piggyback:402.6] Out: 2155 [Urine:2155] Intake/Output this shift: Total I/O In: 240 [P.O.:240] Out: 600 [Urine:600]  No results for input(s): HGB in the last 72 hours. No results for input(s): WBC, RBC, HCT, PLT in the last 72 hours. Recent Labs    08/29/19 0501 08/30/19 0445  NA 132* 134*  K 3.6 3.5  CL 97* 101  CO2 23 23  BUN 18 22*  CREATININE 0.97 1.19  GLUCOSE 262* 255*  CALCIUM 8.7* 8.7*   No results for input(s): LABPT, INR in the last 72 hours.  Aaox3 nad Resp nonlabored RRR LUE: small dorsal incision. Decreased swelling and edema in the hand. Packing within the incision. Intact digit ROM including flex/ex of small finger. Minimal pain with passive MP motion. Fingers wwp with BCR. SILT throughout all digits.   Assessment/Plan: Left dorsal hand abscess s/p bedside I&D 5/27  - Patient doing well over night. Pain improved - cxs pending - PT wound care for whirlpool and dressing changes - OK to pull packing at next dressing change - Encourage digit ROM and elevation - abx per primary - remainder per primary  OK for d/c from hand standpoint once abx plan established and patient educated on wound care. Please call with questions or concerns. Follow up with me next week for wound check   Cain Saupe III 08/30/2019, 11:35 AM  [phone]

## 2019-08-30 NOTE — Progress Notes (Signed)
[facility]ious [facility]   Reason for visit: Follow up on abscess  Interval History: s/p bedside debridement by Dr. Jeannie Fend with pus noted, irrigated.  He feels much better.  No fever, no leukocytosis.    Physical Exam: Constitutional:  Vitals:   08/30/19 0531 08/30/19 0807  BP: 122/71   Pulse: (!) 59 62  Resp: 18   Temp: 98 F (36.7 C)   SpO2: 99%    patient appears in NAD Respiratory: Normal respiratory effort; CTA B Cardiovascular: RRR GI: soft, nt, nd MS: hand wrapped  Review of Systems: Constitutional: negative for fevers and chills Gastrointestinal: negative for diarrhea  Lab Results  Component Value Date   WBC 9.9 08/27/2019   HGB 13.8 08/27/2019   HCT 39.8 08/27/2019   MCV 94.5 08/27/2019   PLT 192 08/27/2019    Lab Results  Component Value Date   CREATININE 1.19 08/30/2019   BUN 22 (H) 08/30/2019   NA 134 (L) 08/30/2019   K 3.5 08/30/2019   CL 101 08/30/2019   CO2 23 08/30/2019    Lab Results  Component Value Date   ALT 44 10/18/2018   AST 26 10/18/2018   ALKPHOS 79 10/18/2018     Microbiology: Recent Results (from the past 240 hour(s))  Culture, blood (Routine X 2) w Reflex to ID Panel     Status: None (Preliminary result)   Collection Time: 08/26/19  3:52 PM   Specimen: BLOOD  Result Value Ref Range Status   Specimen Description   Final    BLOOD RIGHT ANTECUBITAL Performed at [facility], [place] [address] [place], [place] [address]    Special Requests   Final    BOTTLES DRAWN AEROBIC AND ANAEROBIC Blood Culture results may not be optimal due to an inadequate volume of blood received in culture bottles Performed at [facility] [address] [place], [place] [address]    Culture   Final    NO GROWTH 4 DAYS Performed at [facility], [place] [address], [place], [place] [address]    Report Status PENDING  Incomplete  Culture, blood (Routine X 2) w Reflex to ID Panel      Status: None (Preliminary result)   Collection Time: 08/26/19  3:57 PM   Specimen: BLOOD  Result Value Ref Range Status   Specimen Description   Final    BLOOD RIGHT ANTECUBITAL Performed at [facility] [address] [place], [place] [address]    Special Requests   Final    BOTTLES DRAWN AEROBIC AND ANAEROBIC Blood Culture adequate volume Performed at [facility] [address], [place], [place] [address]    Culture   Final    NO GROWTH 4 DAYS Performed at [facility], [place] [address], [place], [place] [address]    Report Status PENDING  Incomplete  SARS Coronavirus 2 by RT PCR (hospital order, performed in [facility] hospital lab) Nasopharyngeal Nasopharyngeal Swab     Status: None   Collection Time: 08/26/19  4:38 PM   Specimen: Nasopharyngeal Swab  Result Value Ref Range Status   SARS Coronavirus 2 NEGATIVE NEGATIVE Final    Comment: (NOTE) SARS-CoV-2 target nucleic acids are NOT DETECTED. The SARS-CoV-2 RNA is generally detectable in upper and lower respiratory specimens during the acute phase of infection. The lowest concentration of SARS-CoV-2 viral copies this assay can detect is 250 copies / mL. A negative result does not  preclude SARS-CoV-2 infection and should not be used as the sole basis for treatment or other patient management decisions.  A negative result may occur with improper specimen collection / handling, submission of specimen other than nasopharyngeal swab, presence of viral mutation(s) within the areas targeted by this assay, and inadequate number of viral copies (<250 copies / mL). A negative result must be combined with clinical observations, patient history, and epidemiological information. Fact Sheet for Patients:   BoilerBrush.com.cy Fact Sheet for Healthcare Providers: https://pope.com/ This test is not yet approved or cleared  by the [place] FDA  and has been authorized for detection and/or diagnosis of SARS-CoV-2 by FDA under an Emergency Use Authorization (EUA).  This EUA will remain in effect (meaning this test can be used) for the duration of the COVID-19 declaration under Section 564(b)(1) of the Act, 21 U.S.C. section 360bbb-3(b)(1), unless the authorization is terminated or revoked sooner. Performed at [facility], [address] [place], [place] [address]     Impression/Plan:  1. Hand abscess - culture sent but on antibiotics so may remain negative.  I will continue to monitor the culture for any growth.   He can continue with Teflaro while inpatient. Bactrim 1 DS twice a day and Keflex at discharge for 2 weeks will give good, broad coverage.    2. DM - last Hgb A1c was 9.9.  Will need continued efforts at DM control

## 2019-08-31 LAB — CULTURE, BLOOD (ROUTINE X 2)
Culture: NO GROWTH
Culture: NO GROWTH
Special Requests: ADEQUATE

## 2019-09-03 ENCOUNTER — Telehealth: Payer: Self-pay

## 2019-09-03 NOTE — Telephone Encounter (Addendum)
Transition Care Management Follow-up Telephone Call Date of discharge and from where: 08/30/2019 [facility] pt unable to reach/ Left voice message to call back. Name and phone nr provided.

## 2019-09-06 DIAGNOSIS — M79642 Pain in left hand: Secondary | ICD-10-CM | POA: Insufficient documentation

## 2019-09-23 NOTE — Progress Notes (Signed)
Patient ID: David [name], male   DOB: 11/21/1960, 59 y.o.   MRN: [id number]     David [name], is a 59 y.o. male  GMW:[id number]  YQI:[id number]  DOB - [birth year]  Subjective:  Chief Complaint and HPI: David [name] is a 59 y.o. male here today for a follow up visit After hospitalization 5/24-5/28/2021 for cellulitis L hand. No fever.  Infection resolving.  Saw hand specialist yesterday and healing well.  Says his inulin was left out in the heat in his car so he hasn't used it in a few days.  His last OV with [place] was July 2020 and he did not keep f/up appts. Diabetes has been uncontrolled.  Compliance with regimen is questionable.  A1C=9.7 in May 2021.    From discharge summary: History of present illness:  59 year old man PMH including diabetes mellitus type 2 on insulin, atrial fibrillation on apixaban, presented with left hand cellulitis which he thinks was secondary tobrown recluse bite  Hospital Course:  Patient was treated conservatively with IV antibiotics but failed to have significant improvement, MRI was equivocal and patient was seen by hand surgery, underwent bedside incision and drainage with rapid clinical improvement thereafter.  I discussed the case with Dr. Roney Mans hand surgery today who recommended discharge home and he will arrange outpatient follow-up next week as well as outpatient wound therapy in his office.  Antibiotics as per infectious disease.  I discussed wound care recommendations extensively with the patient and requested nurse remove packing prior to discharge as instructed by hand surgery.  Left hand cellulitis, patient reportedly had a spider bite. --Dr. Roney Mans will arrange outpatient follow-up for wound check and wound care next week. --Recommends 4 x 4 gauze, Ace wrap, change daily, do not get wet  Diabetes mellitus type 2 on insulin. Hemoglobin A1c 9.7. --Remained stable.  Patient had run out of Levemir, was given a new prescription.  Mild  hyponatremia, asymptomatic. Suspect diuretic related.   Atrial fibrillation --Remained stable.  Continue apixaban, carvedilol, digoxin  Chronic systolic CHF, LVEF 35-40% cardiogram July 2020. --Remained stable.Continue carvedilol, digoxin, furosemide,spironolactone   ED/Hospital notes reviewed.   Social History:  Works as a Investment banker, operational  ROS:   Constitutional:  No f/c, No night sweats, No unexplained weight loss. EENT:  No vision changes, No blurry vision, No hearing changes. No mouth, throat, or ear problems.  Respiratory: No cough, No SOB Cardiac: No CP, no palpitations GI:  No abd pain, No N/V/D. GU: No Urinary s/sx Musculoskeletal: hand is much improved Neuro: No headache, no dizziness, no motor weakness.  Skin: No rash Endocrine:  No polydipsia. No polyuria.  Psych: Denies SI/HI  No problems updated.  ALLERGIES: No Known Allergies  PAST MEDICAL HISTORY: Past Medical History:  Diagnosis Date  . Cardiomyopathy (HCC)    a. EF 45% in 2019.  Marland Kitchen Chronic anticoagulation 09/30/2017  . Diabetes mellitus type 2 in nonobese (HCC)   . Does not have health insurance   . Essential hypertension 09/26/2017  . Financial difficulties   . H/O noncompliance with medical treatment, presenting hazards to health   . Non-insulin treated type 2 diabetes mellitus (HCC) 09/26/2017  . Persistent atrial fibrillation (HCC)   . Sleep apnea suspected 09/30/2017    MEDICATIONS AT HOME: Prior to Admission medications   Medication Sig Start Date End Date Taking? Authorizing Provider  apixaban (ELIQUIS) 5 MG TABS tablet Take 1 tablet (5 mg total) by mouth 2 (two) times daily. 10/19/18   Hongalgi, Maximino Greenland, MD  atorvastatin (LIPITOR) 20 MG tablet Take 1 tablet (20 mg total) by mouth daily. 09/25/19   Anders Simmonds, PA-C  carvedilol (COREG) 25 MG tablet Take 1 tablet (25 mg total) by mouth 2 (two) times daily. 10/30/18 08/26/19  Rosalio Macadamia, NP  digoxin (LANOXIN) 0.125 MG tablet Take 1 tablet (0.125  mg total) by mouth daily. 10/30/18   Rosalio Macadamia, NP  furosemide (LASIX) 40 MG tablet Take 1 tablet (40 mg total) by mouth daily. 10/30/18   Rosalio Macadamia, NP  ibuprofen (ADVIL) 200 MG tablet Take 2 tablets (400 mg total) by mouth every 6 (six) hours as needed for mild pain. 08/30/19   Standley Brooking, MD  insulin aspart (NOVOLOG) 100 UNIT/ML FlexPen Inject 6 Units into the skin in the morning, at noon, in the evening, and at bedtime. 09/25/19   Anders Simmonds, PA-C  insulin detemir (LEVEMIR) 100 UNIT/ML FlexPen Inject 16 Units into the skin at bedtime. 09/25/19   Anders Simmonds, PA-C  oxyCODONE-acetaminophen (PERCOCET/ROXICET) 5-325 MG tablet Take 1 tablet by mouth every 6 (six) hours as needed for moderate pain or severe pain. 08/30/19   Standley Brooking, MD  sacubitril-valsartan (ENTRESTO) 24-26 MG Take 1 tablet by mouth 2 (two) times daily. 09/25/19   Anders Simmonds, PA-C  sodium chloride (OCEAN) 0.65 % SOLN nasal spray Place 1 spray into both nostrils as needed for congestion.    [provider]  spironolactone (ALDACTONE) 25 MG tablet Take 0.5 tablets (12.5 mg total) by mouth daily. 10/30/18   Rosalio Macadamia, NP     Objective:  EXAM:   Vitals:   09/25/19 1531  BP: 132/85  Pulse: 67  Resp: 16  Temp: (!) 96.6 F (35.9 C)  SpO2: 95%  Weight: 191 lb (86.6 kg)    General appearance : A&OX3. NAD. Non-toxic-appearing HEENT: Atraumatic and Normocephalic.  PERRLA. EOM intact.   Neck: supple, no JVD. No cervical lymphadenopathy. No thyromegaly Chest/Lungs:  Breathing-non-labored, Good air entry bilaterally, breath sounds normal without rales, rhonchi, or wheezing  CVS: S1 S2 regular, no murmurs, gallops, rubs  L hand-incision site over mid proximal metacarpal closed and minimal to no erythema.  ROM 90% Extremities: Bilateral Lower Ext shows no edema, both legs are warm to touch with = pulse throughout Neurology:  CN II-XII grossly intact, Non focal.   Psych:  TP  linear. J/I WNL. Normal speech. Appropriate eye contact and affect.  Skin:  No Rash  Data Review Lab Results  Component Value Date   HGBA1C 9.7 (H) 08/26/2019   HGBA1C 9.9 (H) 10/17/2018   HGBA1C 8.5 (H) 09/26/2017     Assessment & Plan   1. Diabetes mellitus type 2, uncontrolled, with complications (HCC) Uncontrolled.  Take meds!!  Compliance imperative.  Glucose came down some after insulin - Glucose (CBG) - insulin aspart (novoLOG) injection 20 Units - POCT URINALYSIS DIP (CLINITEK) Increase dose- insulin detemir (LEVEMIR) 100 UNIT/ML FlexPen; Inject 16 Units into the skin at bedtime.  Dispense: 15 mL; Refill: 3 - insulin aspart (NOVOLOG) 100 UNIT/ML FlexPen; Inject 6 Units into the skin in the morning, at noon, in the evening, and at bedtime.  Dispense: 3 pen; Refill: 2  2. Mixed hyperlipidemia - atorvastatin (LIPITOR) 20 MG tablet; Take 1 tablet (20 mg total) by mouth daily.  Dispense: 90 tablet; Refill: 3  3. Essential hypertension Controlled.  See cardiology 7/9 as planned - sacubitril-valsartan (ENTRESTO) 24-26 MG; Take 1 tablet by mouth 2 (two)  times daily.  Dispense: 60 tablet; Refill: 11  4. Cellulitis of left upper extremity Resolving-continue with hand  5. Hospital discharge follow-up  6. Non-compliance Compliance imperative.     Patient have been counseled extensively about nutrition and exercise  Return in about 3 weeks (around 10/16/2019) for [facility] in 3 weeks for DM management and 2 months with PCP.  The patient was given clear instructions to go to ER or return to medical center if symptoms don't improve, worsen or new problems develop. The patient verbalized understanding. The patient was told to call to get lab results if they haven't heard anything in the next week.     Freeman Caldron, PA-C [facility] [place], [place]   09/25/2019, 3:59 PM

## 2019-09-25 ENCOUNTER — Other Ambulatory Visit: Payer: Self-pay

## 2019-09-25 ENCOUNTER — Ambulatory Visit: Payer: Self-pay | Attending: Nurse Practitioner | Admitting: Physician Assistant

## 2019-09-25 ENCOUNTER — Other Ambulatory Visit: Payer: Self-pay | Admitting: Physician Assistant

## 2019-09-25 VITALS — BP 132/85 | HR 67 | Temp 96.6°F | Resp 16 | Wt 191.0 lb

## 2019-09-25 DIAGNOSIS — Z09 Encounter for follow-up examination after completed treatment for conditions other than malignant neoplasm: Secondary | ICD-10-CM

## 2019-09-25 DIAGNOSIS — E118 Type 2 diabetes mellitus with unspecified complications: Secondary | ICD-10-CM

## 2019-09-25 DIAGNOSIS — Z9119 Patient's noncompliance with other medical treatment and regimen: Secondary | ICD-10-CM

## 2019-09-25 DIAGNOSIS — Z91199 Patient's noncompliance with other medical treatment and regimen due to unspecified reason: Secondary | ICD-10-CM

## 2019-09-25 DIAGNOSIS — IMO0002 Reserved for concepts with insufficient information to code with codable children: Secondary | ICD-10-CM

## 2019-09-25 DIAGNOSIS — E1165 Type 2 diabetes mellitus with hyperglycemia: Secondary | ICD-10-CM

## 2019-09-25 DIAGNOSIS — E782 Mixed hyperlipidemia: Secondary | ICD-10-CM

## 2019-09-25 DIAGNOSIS — I10 Essential (primary) hypertension: Secondary | ICD-10-CM

## 2019-09-25 DIAGNOSIS — L03114 Cellulitis of left upper limb: Secondary | ICD-10-CM

## 2019-09-25 LAB — POCT URINALYSIS DIP (CLINITEK)
Bilirubin, UA: NEGATIVE
Blood, UA: NEGATIVE
Glucose, UA: 500 mg/dL — AB
Ketones, POC UA: NEGATIVE mg/dL
Leukocytes, UA: NEGATIVE
Nitrite, UA: NEGATIVE
POC PROTEIN,UA: 30 — AB
Spec Grav, UA: 1.02 (ref 1.010–1.025)
Urobilinogen, UA: 1 E.U./dL
pH, UA: 7 (ref 5.0–8.0)

## 2019-09-25 LAB — GLUCOSE, POCT (MANUAL RESULT ENTRY)
POC Glucose: 374 mg/dl — AB (ref 70–99)
POC Glucose: 433 mg/dl — AB (ref 70–99)

## 2019-09-25 MED ORDER — ATORVASTATIN CALCIUM 20 MG PO TABS: 20.0000 mg | ORAL_TABLET | Freq: Every day | ORAL | 3 refills | Status: DC

## 2019-09-25 MED ORDER — INSULIN DETEMIR 100 UNIT/ML FLEXPEN: 16.0000 [IU] | PEN_INJECTOR | Freq: Every day | SUBCUTANEOUS | 3 refills | Status: DC

## 2019-09-25 MED ORDER — INSULIN ASPART 100 UNIT/ML ~~LOC~~ SOLN
20.0000 [IU] | Freq: Once | SUBCUTANEOUS | Status: AC
  Administered 2019-09-25: 20 [IU] via SUBCUTANEOUS

## 2019-09-25 MED ORDER — INSULIN ASPART 100 UNIT/ML FLEXPEN: 6.0000 [IU] | PEN_INJECTOR | Freq: Four times a day (QID) | SUBCUTANEOUS | 2 refills | Status: DC

## 2019-09-25 MED ORDER — SACUBITRIL-VALSARTAN 24-26 MG PO TABS: 1.0000 | ORAL_TABLET | Freq: Two times a day (BID) | ORAL | 11 refills | Status: DC

## 2019-09-25 MED FILL — LEVEMIR FLEXTOUCH 100 UNITS: 100 | 18 days supply | Qty: 3 | Fill #0

## 2019-09-25 MED FILL — NOVOLOG FLEXPEN SYRINGE: 100 | 12 days supply | Qty: 3 | Fill #0

## 2019-09-25 MED FILL — ENTRESTO 24 MG-26 MG TABLET: 24-26 | 30 days supply | Qty: 60 | Fill #0

## 2019-09-25 MED FILL — ?ATORVASTATIN 20 MG TABLET: 20 | 30 days supply | Qty: 30 | Fill #0

## 2019-09-27 MED FILL — ?FUROSEMIDE 4OMG TABLETS: 40 | 30 days supply | Qty: 30 | Fill #8

## 2019-09-27 MED FILL — ?CARVEDILOL 25 MG TABLET: 25 | 30 days supply | Qty: 60 | Fill #8

## 2019-09-27 MED FILL — ?SPIRONOLACTONE 25 MG TABLE: 25 | 30 days supply | Qty: 15 | Fill #8

## 2019-10-11 ENCOUNTER — Ambulatory Visit: Payer: Self-pay | Admitting: Physician Assistant

## 2019-10-16 ENCOUNTER — Ambulatory Visit: Payer: Self-pay | Admitting: Pharmacist

## 2019-10-23 ENCOUNTER — Other Ambulatory Visit: Payer: Self-pay | Admitting: Nurse Practitioner

## 2019-10-23 DIAGNOSIS — IMO0002 Reserved for concepts with insufficient information to code with codable children: Secondary | ICD-10-CM

## 2019-10-23 DIAGNOSIS — E782 Mixed hyperlipidemia: Secondary | ICD-10-CM

## 2019-10-23 NOTE — Telephone Encounter (Signed)
Requested medication (s) are due for refill today: Yes  Requested medication (s) are on the active medication list: Yes  Last refill:  10/30/18 by another provider  Future visit scheduled: No  Notes to clinic:  Unable to refill per protocol due to last refilled by another provider: Spironolactone, Furosemide, Carvedilol, Digoxin  Levimir and Atorvastatin last refilled 4 weeks ago-unable to call the pharmacy to verify receipt due to pharmacy closed.     Requested Prescriptions  Pending Prescriptions Disp Refills   atorvastatin (LIPITOR) 20 MG tablet 90 tablet 3    Sig: Take 1 tablet (20 mg total) by mouth daily.      Cardiovascular:  Antilipid - Statins Failed - 10/23/2019  5:51 PM      Failed - Total Cholesterol in normal range and within 360 days    Cholesterol  Date Value Ref Range Status  10/18/2018 215 (H) 0 - 200 mg/dL Final          Failed - LDL in normal range and within 360 days    LDL Cholesterol  Date Value Ref Range Status  10/18/2018 161 (H) 0 - 99 mg/dL Final    Comment:           Total Cholesterol/HDL:CHD Risk Coronary Heart Disease Risk Table                     Men   Women  1/2 Average Risk   3.4   3.3  Average Risk       5.0   4.4  2 X Average Risk   9.6   7.1  3 X Average Risk  23.4   11.0        Use the calculated Patient Ratio above and the CHD Risk Table to determine the patient's CHD Risk.        ATP III CLASSIFICATION (LDL):  <100     mg/dL   Optimal  161-096  mg/dL   Near or Above                    Optimal  130-159  mg/dL   Borderline  045-409  mg/dL   High  >811     mg/dL   Very High Performed at [facility], [address], [place], [place] [address]           Failed - HDL in normal range and within 360 days    HDL  Date Value Ref Range Status  10/18/2018 33 (L) >40 mg/dL Final          Failed - Triglycerides in normal range and within 360 days    Triglycerides  Date Value Ref Range Status  10/18/2018 103 <150 mg/dL  Final          Passed - Patient is not pregnant      Passed - Valid encounter within last 12 months    Recent Outpatient Visits           4 weeks ago Diabetes mellitus type 2, uncontrolled, with complications [facility])   [place] [facility] [place], [place], [place]   11 months ago Diabetes mellitus type 2, uncontrolled, with complications [facility])   [place] [facility] Claiborne Rigg, NP       Future Appointments             In 1 week Lois Huxley, Cornelius Moras, RPH-CPP [facility]  In 3 weeks Dyann Kief, PA-C [facility] [address], [facility]   In 1 month [place], Shea Stakes, NP [place] [facility]              carvedilol (COREG) 25 MG tablet 180 tablet 3    Sig: Take 1 tablet (25 mg total) by mouth 2 (two) times daily.      Cardiovascular:  Beta Blockers Passed - 10/23/2019  5:51 PM      Passed - Last BP in normal range    BP Readings from Last 1 Encounters:  09/25/19 132/85          Passed - Last Heart Rate in normal range    Pulse Readings from Last 1 Encounters:  09/25/19 67          Passed - Valid encounter within last 6 months    Recent Outpatient Visits           4 weeks ago Diabetes mellitus type 2, uncontrolled, with complications [facility])   [facility] [place], [place], [place]   11 months ago Diabetes mellitus type 2, uncontrolled, with complications [facility])   [place] [facility] Claiborne Rigg, NP       Future Appointments             In 1 week Lois Huxley, Cornelius Moras, RPH-CPP [place] [facility]   In 3 weeks [place], Tarri Abernethy, PA-C [facility], [facility]   In 1 month [place], Shea Stakes, NP [place] [facility]              digoxin (LANOXIN) 0.125 MG tablet 30 tablet 11    Sig: Take 1 tablet (0.125 mg total) by mouth daily.       Cardiovascular:  Antiarrhythmic Agents - digoxin Failed - 10/23/2019  5:51 PM      Failed - Digoxin (serum) in normal range and within 180 days    No results found for: DIGOXIN        Failed - Ca in normal range and within 180 days    Calcium  Date Value Ref Range Status  08/30/2019 8.7 (L) 8.9 - 10.3 mg/dL Final          Passed - Cr in normal range and within 180 days    Creatinine, Ser  Date Value Ref Range Status  08/30/2019 1.19 0.61 - 1.24 mg/dL Final          Passed - K in normal range and within 180 days    Potassium  Date Value Ref Range Status  08/30/2019 3.5 3.5 - 5.1 mmol/L Final          Passed - Last Heart Rate in normal range    Pulse Readings from Last 1 Encounters:  09/25/19 67          Passed - Valid encounter within last 6 months    Recent Outpatient Visits           4 weeks ago Diabetes mellitus type 2, uncontrolled, with complications [facility])   [facility] [place], [place], [place]   11 months ago Diabetes mellitus type 2, uncontrolled, with complications [facility])   [place] [facility] [place], Shea Stakes, NP       Future Appointments             In 1 week Lois Huxley,  Cornelius Moras, RPH-CPP [facility] [facility]   In 3 weeks [place], Tarri Abernethy, PA-C [facility] [address], [facility]   In 1 month [place], Shea Stakes, NP [place] [facility]              furosemide (LASIX) 40 MG tablet 30 tablet 11    Sig: Take 1 tablet (40 mg total) by mouth daily.      Cardiovascular:  Diuretics - Loop Failed - 10/23/2019  5:51 PM      Failed - Ca in normal range and within 360 days    Calcium  Date Value Ref Range Status  08/30/2019 8.7 (L) 8.9 - 10.3 mg/dL Final          Failed - Na in normal range and within 360 days    Sodium  Date Value Ref Range Status  08/30/2019 134 (L) 135 - 145 mmol/L Final  11/16/2018 135 134 - 144 mmol/L Final           Passed - K in normal range and within 360 days    Potassium  Date Value Ref Range Status  08/30/2019 3.5 3.5 - 5.1 mmol/L Final          Passed - Cr in normal range and within 360 days    Creatinine, Ser  Date Value Ref Range Status  08/30/2019 1.19 0.61 - 1.24 mg/dL Final          Passed - Last BP in normal range    BP Readings from Last 1 Encounters:  09/25/19 132/85          Passed - Valid encounter within last 6 months    Recent Outpatient Visits           4 weeks ago Diabetes mellitus type 2, uncontrolled, with complications [facility])   [facility] [place], [place], [place]   11 months ago Diabetes mellitus type 2, uncontrolled, with complications [facility])   [place] [facility] Claiborne Rigg, NP       Future Appointments             In 1 week Lois Huxley, Cornelius Moras, RPH-CPP [place] [facility]   In 3 weeks [place], Tarri Abernethy, PA-C [facility], [facility]   In 1 month [place], Shea Stakes, NP [place] [facility]              insulin detemir (LEVEMIR) 100 UNIT/ML FlexPen 15 mL 3    Sig: Inject 16 Units into the skin at bedtime.      Endocrinology:  Diabetes - Insulins Failed - 10/23/2019  5:51 PM      Failed - HBA1C is between 0 and 7.9 and within 180 days    Hgb A1c MFr Bld  Date Value Ref Range Status  08/26/2019 9.7 (H) 4.8 - 5.6 % Final    Comment:    (NOTE) Pre diabetes:          5.7%-6.4% Diabetes:              >6.4% Glycemic control for   <7.0% adults with diabetes           Passed - Valid encounter within last 6 months    Recent Outpatient Visits           4 weeks ago Diabetes mellitus type 2, uncontrolled, with complications (HCC)   [place]  [facility] [place], [place], [place]   11 months ago Diabetes mellitus type 2, uncontrolled, with complications [facility])   [place] [facility] Claiborne Rigg, NP       Future Appointments             In 1 week Lois Huxley, Cornelius Moras, RPH-CPP [place] [facility]   In 3 weeks [place], Tarri Abernethy, PA-C [facility], [facility]   In 1 month [place], Shea Stakes, NP [place] [facility]              spironolactone (ALDACTONE) 25 MG tablet 30 tablet 11    Sig: Take 0.5 tablets (12.5 mg total) by mouth daily.      Cardiovascular: Diuretics - Aldosterone Antagonist Failed - 10/23/2019  5:51 PM      Failed - Na in normal range and within 360 days    Sodium  Date Value Ref Range Status  08/30/2019 134 (L) 135 - 145 mmol/L Final  11/16/2018 135 134 - 144 mmol/L Final          Passed - Cr in normal range and within 360 days    Creatinine, Ser  Date Value Ref Range Status  08/30/2019 1.19 0.61 - 1.24 mg/dL Final          Passed - K in normal range and within 360 days    Potassium  Date Value Ref Range Status  08/30/2019 3.5 3.5 - 5.1 mmol/L Final          Passed - Last BP in normal range    BP Readings from Last 1 Encounters:  09/25/19 132/85          Passed - Valid encounter within last 6 months    Recent Outpatient Visits           4 weeks ago Diabetes mellitus type 2, uncontrolled, with complications [facility])   [facility] [place], [place], [place]   11 months ago Diabetes mellitus type 2, uncontrolled, with complications [facility])   [place] [facility] [place], Shea Stakes, NP       Future Appointments             In 1 week Lois Huxley, Cornelius Moras, RPH-CPP [place] [facility]   In 3 weeks [place], Tarri Abernethy, PA-C [facility], [facility]   In 1 month [place], Shea Stakes, NP [employer] [facility]

## 2019-10-23 NOTE — Telephone Encounter (Signed)
Medication Refill - Medication:insulin detemir (LEVEMIR) 100 UNIT/ML FlexPen  atorvastatin (LIPITOR) 20 MG tablet  spironolactone (ALDACTONE) 25 MG tablet  furosemide (LASIX) 40 MG tablet  carvedilol (COREG) 25 MG tablet  digoxin (LANOXIN) 0.125 MG tablet    Has the patient contacted their pharmacy? Yes.   (Agent: If no, request that the patient contact the pharmacy for the refill.) (Agent: If yes, when and what did the pharmacy advise?)  Preferred Pharmacy (with phone number or street name):  [facility] - [place], [place] - [place] [address]  [address] [place] [address]  Phone: [phone] Fax: [phone]     Agent: Please be advised that RX refills may take up to 3 business days. We ask that you follow-up with your pharmacy.

## 2019-10-25 MED ORDER — ATORVASTATIN CALCIUM 20 MG PO TABS: 20.0000 mg | ORAL_TABLET | Freq: Every day | ORAL | 0 refills | Status: DC

## 2019-10-25 MED ORDER — CARVEDILOL 25 MG PO TABS: 25.0000 mg | ORAL_TABLET | Freq: Two times a day (BID) | ORAL | 0 refills | Status: DC

## 2019-10-25 MED ORDER — FUROSEMIDE 40 MG PO TABS: 40.0000 mg | ORAL_TABLET | Freq: Every day | ORAL | 0 refills | Status: DC

## 2019-10-25 MED ORDER — SPIRONOLACTONE 25 MG PO TABS: 12.5000 mg | ORAL_TABLET | Freq: Every day | ORAL | 0 refills | Status: DC

## 2019-10-25 MED ORDER — INSULIN DETEMIR 100 UNIT/ML FLEXPEN: 16.0000 [IU] | PEN_INJECTOR | Freq: Every day | SUBCUTANEOUS | 0 refills | Status: DC

## 2019-10-25 MED ORDER — DIGOXIN 125 MCG PO TABS: 0.1250 mg | ORAL_TABLET | Freq: Every day | ORAL | 0 refills | Status: DC

## 2019-10-25 MED FILL — ?DIGITEK 125 MCG TABLET: 125 | 30 days supply | Qty: 30 | Fill #0

## 2019-10-25 MED FILL — ?SPIRONOLACTONE 25 MG TABLE: 25 | 30 days supply | Qty: 15 | Fill #0

## 2019-10-25 MED FILL — !LEVEMIR FLEXPEN 100UNITS/M: 100U/ML (3) | 37 days supply | Qty: 6 | Fill #0

## 2019-10-25 MED FILL — ?CARVEDILOL 25 MG TABLET: 25 | 30 days supply | Qty: 60 | Fill #0

## 2019-10-25 MED FILL — ?ATORVASTATIN 20 MG TABLET: 20 | 30 days supply | Qty: 30 | Fill #0

## 2019-10-25 MED FILL — ?FUROSEMIDE 4OMG TABLETS: 40 | 30 days supply | Qty: 30 | Fill #0

## 2019-10-30 ENCOUNTER — Ambulatory Visit: Payer: Self-pay | Admitting: Pharmacist

## 2019-11-13 ENCOUNTER — Ambulatory Visit: Payer: Self-pay | Admitting: Pharmacist

## 2019-11-13 ENCOUNTER — Ambulatory Visit: Payer: Self-pay | Admitting: Physician Assistant

## 2019-11-19 NOTE — Progress Notes (Unsigned)
S:  Patient arrives in no acute distress. Presents for diabetes evaluation, education, and management. Patient was last seen by PA on behalf of PCP and referred on 09/25/19.  At that visit, BG significantly elevated > 350, urinalysis showed no ketones, levemir dose increased. Pt was instructed to f/u in 3 weeks with PharmD, appt canceled and not seen.  T2DM diagnosed ***  Family/Social History:  - FHx: no pertinent positives - Tobacco: smoker? - Alcohol:   Pertinent PMH: HFrEF (EF 35-40% 10/17/18)  Insurance coverage/medication affordability: self-pay   Current diabetes medications:  1. Levemir 16 units daily at bedtime (last filled 10/25/19) 2. Novolog 6 units with meals and at bedtime (last filled 09/25/19)  Adverse effects: ***  Medication adherence: *** compliance concerns  Prior diabetes med trials: metformin, farxiga  Hypoglycemia: - ***Denies or endorses - Experiences at BG < *** - Treats with: ***  Lifestyle: Diet: *** Eats *** meals/day - Breakfast:*** - Lunch:*** - Dinner:*** - Snacks:*** - Drinks:***  Exercise: ***  DM Comorbidities: - Nocturia: - Neuropathy:  - last DFE - Retinopathy:  - last eye exam - Gastroparesis:   Current hypertension medications include: entresto 24-26 mg BID, carvedilol 25 mg, spironolactone 12.5 mg daily Current hyperlipidemia medications include: atorvastatin 20 mg daily  O:  POCT BG today: ***  Lab Results  Component Value Date   HGBA1C 9.7 (H) 08/26/2019   There were no vitals filed for this visit.  Home BG Readings FBG: *** 2 hour post-meal/random: ***  BMET    Component Value Date/Time   NA 134 (L) 08/30/2019 0445   NA 135 11/16/2018 1128   K 3.5 08/30/2019 0445   CL 101 08/30/2019 0445   CO2 23 08/30/2019 0445   GLUCOSE 255 (H) 08/30/2019 0445   BUN 22 (H) 08/30/2019 0445   BUN 12 11/16/2018 1128   CREATININE 1.19 08/30/2019 0445   CALCIUM 8.7 (L) 08/30/2019 0445   GFRNONAA >60 08/30/2019 0445    GFRAA >60 08/30/2019 0445   Renal function: CrCl ~80 ml/min  Lipid Panel     Component Value Date/Time   CHOL 215 (H) 10/18/2018 0740   TRIG 103 10/18/2018 0740   HDL 33 (L) 10/18/2018 0740   CHOLHDL 6.5 10/18/2018 0740   VLDL 21 10/18/2018 0740   LDLCALC 161 (H) 10/18/2018 0740    Clinical Atherosclerotic Cardiovascular Disease (ASCVD): No  The 10-year ASCVD risk score Denman George DC Jr., et al., 2013) is: 23.9%   Values used to calculate the score:     Age: 59 years     Sex: Male     Is Non-Hispanic African American: No     Diabetic: Yes     Tobacco smoker: No     Systolic Blood Pressure: 132 mmHg     Is BP treated: Yes     HDL Cholesterol: 33 mg/dL     Total Cholesterol: 215 mg/dL   A: 1. Diabetes - Most recent A1c not at goal < 7%, next due 11/26/19 - Clinic BG today *** at goal of *** - Home BG readings *** at goal - Hypoglycemia: ***  - Adherence: *** -  - Future considerations: SGLT2  2. ASCVD: primary prevention - Last LDL above goal, overdue for repeat FLP - On moderate intensity statin - Aspirin is not indicated  3. HTN - BP *** at goal of < 130/80 - Adherence:  P: -  - Increase atorvastatin to 40 mg daily -   - F/U PCP Clinic Visit on  11/25/19 (existing appointment)  Total time in face to face counseling *** minutes.

## 2019-11-20 ENCOUNTER — Ambulatory Visit: Payer: Self-pay | Admitting: Pharmacist

## 2019-11-20 ENCOUNTER — Other Ambulatory Visit: Payer: Self-pay | Admitting: *Deleted

## 2019-11-20 MED ORDER — APIXABAN 5 MG PO TABS: 5.0000 mg | ORAL_TABLET | Freq: Two times a day (BID) | ORAL | 0 refills | Status: DC

## 2019-11-20 NOTE — Telephone Encounter (Signed)
Eliquis 5mg  paper refill request received. Patient is 59 years old, weight-86.6kg, Crea-1.19 on 08/30/2019, Diagnosis-Afib, and last seen by 09/01/2019 on 10/30/2018 and pending an appt with 11/01/2018 on 12/04/2019. Dose is appropriate based on dosing criteria. Will send in refill to requested pharmacy.

## 2019-11-25 ENCOUNTER — Ambulatory Visit: Payer: Self-pay | Admitting: Nurse Practitioner

## 2019-11-28 ENCOUNTER — Other Ambulatory Visit: Payer: Self-pay | Admitting: Family Medicine

## 2019-11-28 DIAGNOSIS — IMO0002 Reserved for concepts with insufficient information to code with codable children: Secondary | ICD-10-CM

## 2019-11-28 DIAGNOSIS — E782 Mixed hyperlipidemia: Secondary | ICD-10-CM

## 2019-11-28 MED FILL — ?CARVEDILOL 25 MG TABLET: 25 | 30 days supply | Qty: 60 | Fill #0

## 2019-11-28 MED FILL — !LEVEMIR FLEXPEN 100UNITS/M: 100U/ML (3) | 37 days supply | Qty: 6 | Fill #0

## 2019-11-28 MED FILL — ?SPIRONOLACTONE 25 MG TABLE: 25 | 30 days supply | Qty: 15 | Fill #0

## 2019-11-28 MED FILL — DIGITEK 125 MCG TABLET: 125 | 30 days supply | Qty: 30 | Fill #0

## 2019-11-28 MED FILL — ?FUROSEMIDE 4OMG TABLETS: 40 | 30 days supply | Qty: 30 | Fill #0

## 2019-11-28 MED FILL — ?ATORVASTATIN 20 MG TABLET: 20 | 30 days supply | Qty: 30 | Fill #0

## 2019-12-02 NOTE — Progress Notes (Deleted)
Cardiology Office Note    Date:  12/02/2019   ID:  David [name], DOB Jul 27, 1960, MRN [id number]  PCP:  Claiborne Rigg, NP  Cardiologist: David Haws, MD EPS: None  No chief complaint on file.   History of Present Illness:  David [name] is a 59 y.o. male with history of persistent atrial fibrillation, cardiomyopathy EF 35 to 40% could be tachycardia mediated, suspected OSA, poorly controlled DM2 due to noncompliance because of financial strain.  Patient had failed cardioversion 09/2017 and had been placed on sotalol with plans for repeat cardioversion but he had stopped all his medications.  CHA2DS2-VASc is at least 3.  He was placed on Eliquis and losartan changed to [facility].  Patient last saw David Fredrickson, NP 10/30/2018 and he was trying to get assistance for Eliquis.  Past Medical History:  Diagnosis Date  . Cardiomyopathy (HCC)    a. EF 45% in 2019.  Marland Kitchen Chronic anticoagulation 09/30/2017  . Diabetes mellitus type 2 in nonobese (HCC)   . Does not have health insurance   . Essential hypertension 09/26/2017  . Financial difficulties   . H/O noncompliance with medical treatment, presenting hazards to health   . Non-insulin treated type 2 diabetes mellitus (HCC) 09/26/2017  . Persistent atrial fibrillation (HCC)   . Sleep apnea suspected 09/30/2017    Past Surgical History:  Procedure Laterality Date  . APPENDECTOMY  1971  . CARDIOVERSION N/A 09/28/2017   Procedure: CARDIOVERSION;  Surgeon: Thurmon Fair, MD;  Location: [facility] ENDOSCOPY;  Service: Cardiovascular;  Laterality: N/A;  . TEE WITHOUT CARDIOVERSION N/A 09/28/2017   Procedure: TRANSESOPHAGEAL ECHOCARDIOGRAM (TEE);  Surgeon: Thurmon Fair, MD;  Location: [facility] ENDOSCOPY;  Service: Cardiovascular;  Laterality: N/A;    Current Medications: No outpatient medications have been marked as taking for the 12/04/19 encounter (Appointment) with Dyann Kief, PA-C.     Allergies:   Patient has no known allergies.   Social  History   Socioeconomic History  . Marital status: Married    Spouse name: Not on file  . Number of children: Not on file  . Years of education: Not on file  . Highest education level: Not on file  Occupational History  . Not on file  Tobacco Use  . Smoking status: Former Smoker    Years: 15.00    Types: Cigarettes    Quit date: 03/18/2018    Years since quitting: 1.7  . Smokeless tobacco: Never Used  . Tobacco comment: 09/26/2017 "2-3 cigarettes/month now"  Vaping Use  . Vaping Use: Never used  Substance and Sexual Activity  . Alcohol use: Yes    Alcohol/week: 3.0 standard drinks    Types: 3 Cans of beer per week  . Drug use: Never  . Sexual activity: Not Currently  Other Topics Concern  . Not on file  Social History Narrative  . Not on file   Social Determinants of Health   Financial Resource Strain:   . Difficulty of Paying Living Expenses: Not on file  Food Insecurity:   . Worried About Programme researcher, broadcasting/film/video in the Last Year: Not on file  . Ran Out of Food in the Last Year: Not on file  Transportation Needs:   . Lack of Transportation (Medical): Not on file  . Lack of Transportation (Non-Medical): Not on file  Physical Activity:   . Days of Exercise per Week: Not on file  . Minutes of Exercise per Session: Not on file  Stress:   . Feeling of  Stress : Not on file  Social Connections:   . Frequency of Communication with Friends and Family: Not on file  . Frequency of Social Gatherings with Friends and Family: Not on file  . Attends Religious Services: Not on file  . Active Member of Clubs or Organizations: Not on file  . Attends Banker Meetings: Not on file  . Marital Status: Not on file     Family History:  The patient's ***family history includes Hypertension in his mother.   ROS:   Please see the history of present illness.    ROS All other systems reviewed and are negative.   PHYSICAL EXAM:   VS:  There were no vitals taken for this  visit.  Physical Exam  GEN: Well nourished, well developed, in no acute distress  HEENT: normal  Neck: no JVD, carotid bruits, or masses Cardiac:RRR; no murmurs, rubs, or gallops  Respiratory:  clear to auscultation bilaterally, normal work of breathing GI: soft, nontender, nondistended, + BS Ext: without cyanosis, clubbing, or edema, Good distal pulses bilaterally MS: no deformity or atrophy  Skin: warm and dry, no rash Neuro:  Alert and Oriented x 3, Strength and sensation are intact Psych: euthymic mood, full affect  Wt Readings from Last 3 Encounters:  09/25/19 191 lb (86.6 kg)  08/26/19 180 lb (81.6 kg)  10/30/18 189 lb (85.7 kg)      Studies/Labs Reviewed:   EKG:  EKG is*** ordered today.  The ekg ordered today demonstrates ***  Recent Labs: 08/27/2019: Hemoglobin 13.8; Platelets 192 08/30/2019: BUN 22; Creatinine, Ser 1.19; Potassium 3.5; Sodium 134   Lipid Panel    Component Value Date/Time   CHOL 215 (H) 10/18/2018 0740   TRIG 103 10/18/2018 0740   HDL 33 (L) 10/18/2018 0740   CHOLHDL 6.5 10/18/2018 0740   VLDL 21 10/18/2018 0740   LDLCALC 161 (H) 10/18/2018 0740    Additional studies/ records that were reviewed today include:     ECHO IMPRESSIONS 10/2018     1. The left ventricle has moderately reduced systolic function, with an ejection fraction of 35-40%. The cavity size was normal. There is moderate concentric left ventricular hypertrophy. Left ventricular diastolic function could not be evaluated  secondary to atrial fibrillation.  2. The right ventricle has moderately reduced systolic function. The cavity was normal. There is no increase in right ventricular wall thickness. Right ventricular systolic pressure is mildly elevated.  3. Mitral valve regurgitation is mild to moderate by color flow Doppler.  4. The tricuspid valve is grossly normal.  5. No stenosis of the aortic valve.  6. The inferior vena cava was dilated in size with <50% respiratory  variability.  7. The interatrial septum was not well visualized.      ASSESSMENT:    No diagnosis found.   PLAN:  In order of problems listed above:   Persistent AF - prior failed cardioversion - was placed on Sotalol - failed to follow up for repeat attempt at cardioversion last year due to significant financial concerns - would most likely need AAD therapy again - for now, trying to control his rate - . - on Eliquis .    Chronic systolic HF - EF 47-09% 10/2018 probably tachycardia mediated or from uncontrolled HTN. Cannot exclude CAD or viral etiology. No excessive alcohol history. Noted that the plan outlined was to control heart rate and blood pressure and then repeat echocardiogram in 3 months.  If LV function remains decreased would need  ischemia evaluation at that time. He looks good clinically -NYHA I - will cut Lasix back to just 40 mg a day. Coreg increased to 25 mg BID. Probably stop potassium but will check lab first.  Needs patient assistance for Entresto - could change back to ARB (sounds like on Losartan in the past). Lab today.    4. Non compliance - had significant financial issues - now back employed but still no health insurance. He does seem quite motivated. Needs to get back to work - letter given today.    5. HTN - BP looks ok today - see #3.    6. DM - per PCP - poorly controlled by most recent A1C - he is trying to establish at [facility].      Medication Adjustments/Labs and Tests Ordered: Current medicines are reviewed at length with the patient today.  Concerns regarding medicines are outlined above.  Medication changes, Labs and Tests ordered today are listed in the Patient Instructions below. There are no Patient Instructions on file for this visit.   Elson Clan, PA-C  12/02/2019 3:48 PM    [facility] [address] [place], [place], [place]  [address] Phone: [phone]; Fax: [phone]

## 2019-12-04 ENCOUNTER — Ambulatory Visit: Payer: Self-pay | Admitting: Physician Assistant

## 2020-01-07 ENCOUNTER — Ambulatory Visit: Payer: Self-pay | Admitting: Physician Assistant

## 2020-01-07 ENCOUNTER — Ambulatory Visit: Payer: Self-pay | Admitting: Nurse Practitioner

## 2020-01-15 ENCOUNTER — Other Ambulatory Visit: Payer: Self-pay | Admitting: Family Medicine

## 2020-01-15 DIAGNOSIS — E782 Mixed hyperlipidemia: Secondary | ICD-10-CM

## 2020-01-15 MED FILL — DIGITEK 125 MCG TABLET: 125 | 30 days supply | Qty: 30 | Fill #0

## 2020-01-15 MED FILL — ?FUROSEMIDE 4OMG TABLETS: 40 | 30 days supply | Qty: 30 | Fill #0

## 2020-01-15 MED FILL — ?SPIRONOLACTONE 25 MG TABLE: 25 | 30 days supply | Qty: 15 | Fill #0

## 2020-01-15 MED FILL — ATORVASTATIN CALCIUM 20 MG: 20 | 30 days supply | Qty: 30 | Fill #0

## 2020-01-15 MED FILL — ?CARVEDILOL 25 MG TABLET: 25 | 30 days supply | Qty: 60 | Fill #0

## 2020-01-15 NOTE — Telephone Encounter (Signed)
Patient is overdue on labs for most Rx RF- courtesy RF until appointment 02/12/20

## 2020-02-12 ENCOUNTER — Ambulatory Visit: Payer: Self-pay | Admitting: Nurse Practitioner

## 2020-02-17 ENCOUNTER — Other Ambulatory Visit: Payer: Self-pay | Admitting: Family Medicine

## 2020-02-17 MED FILL — LEVEMIR FLEXTOUCH 100 UNITS: 100 | 18 days supply | Qty: 3 | Fill #1

## 2020-02-17 MED FILL — ?ATORVASTATIN 20 MG TABLET: 20 | 30 days supply | Qty: 30 | Fill #1

## 2020-02-17 NOTE — Telephone Encounter (Signed)
Requested medication (s) are due for refill today: yes  Requested medication (s) are on the active medication list: yes  Last refill: a done as courtesy refills 01/15/20  Future visit scheduled: yes January Several canceled appointments  Notes to clinic:  All fill 01/15/20 as courtesy refills    Requested Prescriptions  Pending Prescriptions Disp Refills   carvedilol (COREG) 25 MG tablet [Pharmacy Med Name: CARVEDILOL 25 MG TABLET 25 Tablet] 60 tablet 0    Sig: TAKE 1 TABLET (25 MG TOTAL) BY MOUTH 2 (TWO) TIMES DAILY.      Cardiovascular:  Beta Blockers Passed - 02/17/2020  2:12 PM      Passed - Last BP in normal range    BP Readings from Last 1 Encounters:  09/25/19 132/85          Passed - Last Heart Rate in normal range    Pulse Readings from Last 1 Encounters:  09/25/19 67          Passed - Valid encounter within last 6 months    Recent Outpatient Visits           4 months ago Diabetes mellitus type 2, uncontrolled, with complications [facility])   [place] [place], [place], [place]   1 year ago Diabetes mellitus type 2, uncontrolled, with complications [facility])   [facility] Claiborne Rigg, NP       Future Appointments             In 1 month Claiborne Rigg, NP [facility] [employer] [facility]   In 1 month Alben Spittle, [place] T, PA-C [facility]              furosemide (LASIX) 40 MG tablet [Pharmacy Med Name: FUROSEMIDE 4OMG TABLETS 40 Tablet] 30 tablet 0    Sig: TAKE 1 TABLET (40 MG TOTAL) BY MOUTH DAILY.      Cardiovascular:  Diuretics - Loop Failed - 02/17/2020  2:12 PM      Failed - Ca in normal range and within 360 days    Calcium  Date Value Ref Range Status  08/30/2019 8.7 (L) 8.9 - 10.3 mg/dL Final          Failed - Na in normal range and within 360 days    Sodium  Date Value Ref Range Status  08/30/2019 134 (L) 135 - 145 mmol/L Final   11/16/2018 135 134 - 144 mmol/L Final          Passed - K in normal range and within 360 days    Potassium  Date Value Ref Range Status  08/30/2019 3.5 3.5 - 5.1 mmol/L Final          Passed - Cr in normal range and within 360 days    Creatinine, Ser  Date Value Ref Range Status  08/30/2019 1.19 0.61 - 1.24 mg/dL Final          Passed - Last BP in normal range    BP Readings from Last 1 Encounters:  09/25/19 132/85          Passed - Valid encounter within last 6 months    Recent Outpatient Visits           4 months ago Diabetes mellitus type 2, uncontrolled, with complications [facility]   [facility] [place], [place], [place]   1 year ago Diabetes mellitus type 2, uncontrolled, with  complications [facility])   [place] [facility] Claiborne Rigg, NP       Future Appointments             In 1 month Claiborne Rigg, NP [employer] [facility]   In 1 month Alben Spittle, [place] T, PA-C [facility]              spironolactone (ALDACTONE) 25 MG tablet [employer] Med Name: SPIRONOLACTONE 25 MG TABLE 25 Tablet] 15 tablet 0    Sig: TAKE 1/2 TABLET (12.5 MG TOTAL) BY MOUTH DAILY.      Cardiovascular: Diuretics - Aldosterone Antagonist Failed - 02/17/2020  2:12 PM      Failed - Na in normal range and within 360 days    Sodium  Date Value Ref Range Status  08/30/2019 134 (L) 135 - 145 mmol/L Final  11/16/2018 135 134 - 144 mmol/L Final          Passed - Cr in normal range and within 360 days    Creatinine, Ser  Date Value Ref Range Status  08/30/2019 1.19 0.61 - 1.24 mg/dL Final          Passed - K in normal range and within 360 days    Potassium  Date Value Ref Range Status  08/30/2019 3.5 3.5 - 5.1 mmol/L Final          Passed - Last BP in normal range    BP Readings from Last 1 Encounters:  09/25/19 132/85          Passed - Valid encounter within last 6 months     Recent Outpatient Visits           4 months ago Diabetes mellitus type 2, uncontrolled, with complications [facility])   [place] [facility] [place], [place], [place]   1 year ago Diabetes mellitus type 2, uncontrolled, with complications [facility])   [facility] Claiborne Rigg, NP       Future Appointments             In 1 month Claiborne Rigg, NP [facility] [employer] [facility]   In 1 month Alben Spittle, [place] T, PA-C [facility]              DIGITEK 125 MCG tablet [Pharmacy Med Name: DIGITEK 125 MCG TABLET 125 Tablet] 30 tablet 0    Sig: TAKE 1 TABLET (0.125 MG TOTAL) BY MOUTH DAILY.      Cardiovascular:  Antiarrhythmic Agents - digoxin Failed - 02/17/2020  2:12 PM      Failed - Digoxin (serum) in normal range and within 180 days    No results found for: DIGOXIN        Failed - Ca in normal range and within 180 days    Calcium  Date Value Ref Range Status  08/30/2019 8.7 (L) 8.9 - 10.3 mg/dL Final          Passed - Cr in normal range and within 180 days    Creatinine, Ser  Date Value Ref Range Status  08/30/2019 1.19 0.61 - 1.24 mg/dL Final          Passed - K in normal range and within 180 days    Potassium  Date Value Ref Range Status  08/30/2019 3.5 3.5 - 5.1 mmol/L Final          Passed -  Last Heart Rate in normal range    Pulse Readings from Last 1 Encounters:  09/25/19 67          Passed - Valid encounter within last 6 months    Recent Outpatient Visits           4 months ago Diabetes mellitus type 2, uncontrolled, with complications [facility])   [place] [facility] [place], [place], [place]   1 year ago Diabetes mellitus type 2, uncontrolled, with complications [facility])   [place] [facility] Claiborne Rigg, NP       Future Appointments             In 1 month Claiborne Rigg, NP [employer] [facility]   In 1 month Alben Spittle, Acupuncturist T, PA-C [facility]

## 2020-02-18 ENCOUNTER — Ambulatory Visit: Payer: Self-pay | Admitting: Physician Assistant

## 2020-02-18 ENCOUNTER — Other Ambulatory Visit: Payer: Self-pay | Admitting: Family Medicine

## 2020-02-18 MED FILL — ?CARVEDILOL 25 MG TABLET: 25 | 30 days supply | Qty: 60 | Fill #0

## 2020-02-18 MED FILL — ?DIGOXIN 0.125 MG TABLET: 125 | 30 days supply | Qty: 30 | Fill #0

## 2020-02-18 MED FILL — ?SPIRONOLACTONE 25 MG TABLE: 25 | 30 days supply | Qty: 15 | Fill #0

## 2020-02-18 MED FILL — ?FUROSEMIDE 4OMG TABLETS: 40 | 30 days supply | Qty: 30 | Fill #0

## 2020-03-18 ENCOUNTER — Ambulatory Visit: Payer: Self-pay | Admitting: Nurse Practitioner

## 2020-04-08 ENCOUNTER — Ambulatory Visit: Payer: Self-pay | Admitting: Nurse Practitioner

## 2020-04-08 ENCOUNTER — Ambulatory Visit: Payer: Self-pay | Admitting: Physician Assistant

## 2020-04-08 MED FILL — ?SPIRONOLACTONE 25 MG TABLE: 25 | 30 days supply | Qty: 15 | Fill #1

## 2020-04-08 MED FILL — ?CARVEDILOL 25 MG TABLET: 25 | 30 days supply | Qty: 60 | Fill #1

## 2020-04-08 MED FILL — ?ATORVASTATIN 20 MG TABLET: 20 | 30 days supply | Qty: 30 | Fill #2

## 2020-04-08 MED FILL — ?DIGOXIN 0.125 MG TABLET: 125 | 30 days supply | Qty: 30 | Fill #1

## 2020-04-08 MED FILL — ?FUROSEMIDE 40 MG TABLET: 40 | 30 days supply | Qty: 30 | Fill #1

## 2020-04-29 ENCOUNTER — Telehealth: Payer: Self-pay | Admitting: Cardiovascular Disease

## 2020-04-29 NOTE — Telephone Encounter (Signed)
Epimenio Foot, LPN, I am leaving 2 boxes of Eliquis 5 mg tablet samples at the front desk for pt to pick up. Lot# [id number]  Exp: 07/2021 Thanks

## 2020-04-29 NOTE — Telephone Encounter (Signed)
Will forward to our pre/auth nurse.

## 2020-04-29 NOTE — Telephone Encounter (Signed)
Pt c/o medication issue:  1. Name of Medication: apixaban (ELIQUIS) 5 MG TABS tablet  2. How are you currently taking this medication (dosage and times per day)? 1 tablet twice a day  3. Are you having a reaction (difficulty breathing--STAT)? no  4. What is your medication issue? Patient states he only has 6 tablets left of eliquis. He states he was getting the medication through the office for free and is not sure if he needs samples or a coupon to continue getting the medication until his appointment in March.

## 2020-04-29 NOTE — Telephone Encounter (Signed)
**Note De-Identified  Obfuscation** The pt states that he only has 6 Eliquis tablets on hand and about 2 to maybe 3 months of Entresto.  I gave the pt [employer]MSPA[employer] (Eliquis) and [employer]'s (Entresto) phone numbers so he can call both and request that they mail him an application so he can re-apply for asst with each programs.  He is aware to complete both applications once he receives them in the mail, obtain required documents per [employer] Sherryll Burger) and BMSPAF (Eliquis), and to bring all to Dr Ricki Miller office in [place] to drop off and that we will handle the provider page of each applications and that we will fax all to the appropriate Assistance foundations.    He is aware that we are leaving him 2 boxes of Eliquis 5 mg samples in the front office at Dr Ricki Miller office in [place] for him to pick up.

## 2020-05-05 NOTE — Telephone Encounter (Signed)
Pt called in and stated he will be by in the morning to pick up these samples

## 2020-05-12 ENCOUNTER — Other Ambulatory Visit: Payer: Self-pay | Admitting: Nurse Practitioner

## 2020-05-12 ENCOUNTER — Other Ambulatory Visit: Payer: Self-pay | Admitting: Family Medicine

## 2020-05-12 MED FILL — ?FUROSEMIDE 40 MG TABLET: 40 | 30 days supply | Qty: 30 | Fill #0

## 2020-05-12 MED FILL — ?SPIRONOLACTONE 25 MG TABLE: 25 | 30 days supply | Qty: 15 | Fill #0

## 2020-05-12 MED FILL — ?CARVEDILOL 25 MG TABLET: 25 | 30 days supply | Qty: 60 | Fill #0

## 2020-05-12 MED FILL — ?DIGOXIN 0.125 MG TABLET: 125 | 30 days supply | Qty: 30 | Fill #0

## 2020-05-12 MED FILL — ?ATORVASTATIN 20 MG TABLET: 20 | 30 days supply | Qty: 30 | Fill #3

## 2020-05-26 ENCOUNTER — Telehealth: Payer: Self-pay | Admitting: Cardiovascular Disease

## 2020-05-26 NOTE — Telephone Encounter (Signed)
Hey Lynn, LPN, can you please advise on this matter? Thanks  ?

## 2020-05-26 NOTE — Telephone Encounter (Signed)
Needs provider input.   Noted 10 "no show" appointments and no paperwork renewal since 11/2019

## 2020-05-26 NOTE — Telephone Encounter (Signed)
**Note De-Identified  Obfuscation** Will forward this phone note to Dr Eden Emms for his advisement.

## 2020-05-26 NOTE — Telephone Encounter (Addendum)
**Note De-Identified  Obfuscation** The pt has canceled or no showed all 10 appts with [facility] since his last office visit with Norma Fredrickson, NP on 10/30/2018.   The last Eliquis refill we sent in for him was on 11/20/2019.  We did give him 2 boxes of Eliquis samples on 04/29/2020.   He applied for and was approved for pt ass through [employer]MS[employer] on 11/19/2018 until 11/19/2019 for Eliquis but has not applied since that we are aware of.  Unclear if the pt is missing doses of Eliquis but it appears that he maybe. I am forwarding this phone note to PharmD for recommendations on what to do concerning giving more samples.

## 2020-05-26 NOTE — Telephone Encounter (Signed)
Patient calling the office for samples of medication:   1.  What medication and dosage are you requesting samples for? apixaban (ELIQUIS) 5 MG TABS tablet  2.  Are you currently out of this medication?  No. Patient only has two days left  Patient wanted to know if he can get some samples to last him until his appointment 06/09/20.

## 2020-05-26 NOTE — Telephone Encounter (Signed)
Should be seen in office before giving more samples

## 2020-05-26 NOTE — Telephone Encounter (Signed)
Tired to get patient to come in for an earlier appointment. Patient is unable to come in due to his work schedule. Patient does have an appointment with Tereso Newcomer PA next month with he got the day off from work to come to. Patient stated he has also filled out the patient assistance forms and can drop them by the office tomorrow. Will see if management is okay giving samples since patient is trying to get assistance for medication.

## 2020-05-28 NOTE — Telephone Encounter (Signed)
Reviewed with management and Dr. Eden Emms. Patient will need to be seen before samples can be given.

## 2020-05-29 NOTE — Telephone Encounter (Signed)
Pt came by the office and dropped off paperwork for patient assistance and was told per note, per Rinaldo Cloud, RN, that pt will need to be seen first before anymore samples of Eliquis. Pt verbalized understanding.

## 2020-06-02 ENCOUNTER — Telehealth: Payer: Self-pay

## 2020-06-02 NOTE — Telephone Encounter (Signed)
**Note De-Identified  Obfuscation** The pts BMSPAF application (with financial documents) for Eliquis was left at the office. I have completed the provider page of the application and emailed all to Dr Ricki Miller nurse so she can obtain his signature, date it, and to fax to [facility] at fax number written on cover letter included.

## 2020-06-03 NOTE — Telephone Encounter (Addendum)
Sent fax this morning and received confirmation faxed was sent.

## 2020-06-09 ENCOUNTER — Encounter: Payer: Self-pay | Admitting: Nurse Practitioner

## 2020-06-09 ENCOUNTER — Encounter: Payer: Self-pay | Admitting: Physician Assistant

## 2020-06-09 ENCOUNTER — Ambulatory Visit: Payer: Self-pay | Attending: Nurse Practitioner | Admitting: Nurse Practitioner

## 2020-06-09 ENCOUNTER — Other Ambulatory Visit: Payer: Self-pay

## 2020-06-09 ENCOUNTER — Ambulatory Visit (INDEPENDENT_AMBULATORY_CARE_PROVIDER_SITE_OTHER): Payer: Self-pay | Admitting: Physician Assistant

## 2020-06-09 ENCOUNTER — Other Ambulatory Visit: Payer: Self-pay | Admitting: Nurse Practitioner

## 2020-06-09 VITALS — BP 150/62 | HR 76 | Ht 72.0 in | Wt 191.0 lb

## 2020-06-09 DIAGNOSIS — Z7901 Long term (current) use of anticoagulants: Secondary | ICD-10-CM

## 2020-06-09 DIAGNOSIS — I4819 Other persistent atrial fibrillation: Secondary | ICD-10-CM

## 2020-06-09 DIAGNOSIS — E785 Hyperlipidemia, unspecified: Secondary | ICD-10-CM

## 2020-06-09 DIAGNOSIS — Z1211 Encounter for screening for malignant neoplasm of colon: Secondary | ICD-10-CM

## 2020-06-09 DIAGNOSIS — Z794 Long term (current) use of insulin: Secondary | ICD-10-CM

## 2020-06-09 DIAGNOSIS — I10 Essential (primary) hypertension: Secondary | ICD-10-CM

## 2020-06-09 DIAGNOSIS — I429 Cardiomyopathy, unspecified: Secondary | ICD-10-CM

## 2020-06-09 DIAGNOSIS — I5020 Unspecified systolic (congestive) heart failure: Secondary | ICD-10-CM

## 2020-06-09 DIAGNOSIS — Z1159 Encounter for screening for other viral diseases: Secondary | ICD-10-CM

## 2020-06-09 DIAGNOSIS — E119 Type 2 diabetes mellitus without complications: Secondary | ICD-10-CM

## 2020-06-09 DIAGNOSIS — E1165 Type 2 diabetes mellitus with hyperglycemia: Secondary | ICD-10-CM

## 2020-06-09 MED ORDER — TRUEPLUS LANCETS 28G MISC: 3 refills | Status: DC

## 2020-06-09 MED ORDER — ATORVASTATIN CALCIUM 20 MG PO TABS: 20.0000 mg | ORAL_TABLET | Freq: Every day | ORAL | 0 refills | Status: DC

## 2020-06-09 MED ORDER — INSULIN DETEMIR 100 UNIT/ML FLEXPEN: 16.0000 [IU] | PEN_INJECTOR | Freq: Every day | SUBCUTANEOUS | 0 refills | Status: DC

## 2020-06-09 MED ORDER — DIGOXIN 125 MCG PO TABS
125.0000 ug | ORAL_TABLET | Freq: Every day | ORAL | 0 refills | Status: DC
Start: 2020-06-09 — End: 2021-01-08
  Filled 2020-07-29: qty 30, 30d supply, fill #0
  Filled 2020-09-10: qty 30, 30d supply, fill #1

## 2020-06-09 MED ORDER — CARVEDILOL 25 MG PO TABS: 25.0000 mg | ORAL_TABLET | Freq: Two times a day (BID) | ORAL | 0 refills | Status: DC

## 2020-06-09 MED ORDER — SPIRONOLACTONE 25 MG PO TABS: 12.5000 mg | ORAL_TABLET | Freq: Every day | ORAL | 0 refills | Status: DC

## 2020-06-09 MED ORDER — FUROSEMIDE 40 MG PO TABS: 40.0000 mg | ORAL_TABLET | Freq: Every day | ORAL | 0 refills | Status: DC

## 2020-06-09 MED ORDER — BD PEN NEEDLE MINI U/F 31G X 5 MM MISC: 3 refills | Status: DC

## 2020-06-09 MED FILL — TRUEplus LANCETS 28G MISC: 33 days supply | Qty: 100 | Fill #0

## 2020-06-09 MED FILL — TRUEPLUS 5-BEVEL PEN NEEDLE: 31G X 5 MM | 25 days supply | Qty: 100 | Fill #0

## 2020-06-09 MED FILL — ?DIGOXIN 0.125 MG TABLET: 125 | 30 days supply | Qty: 30 | Fill #0

## 2020-06-09 MED FILL — !LEVEMIR FLEXTOUCH 100 UNIT: 100 | 37 days supply | Qty: 6 | Fill #0

## 2020-06-09 MED FILL — ?FUROSEMIDE 40 MG TABLET: 40 | 30 days supply | Qty: 30 | Fill #0

## 2020-06-09 MED FILL — ?CARVEDILOL 25 MG TABLET: 25 | 30 days supply | Qty: 60 | Fill #0

## 2020-06-09 MED FILL — ?ATORVASTATIN 20 MG TABLET: 20 | 30 days supply | Qty: 30 | Fill #0

## 2020-06-09 MED FILL — ?SPIRONOLACTONE 25 MG TABLE: 25 | 30 days supply | Qty: 15 | Fill #0

## 2020-06-09 NOTE — Progress Notes (Signed)
Virtual Visit via Telephone Note Due to national recommendations of social distancing due to [place] 19, telehealth visit is felt to be most appropriate for this patient at this time.  I discussed the limitations, risks, security and privacy concerns of performing an evaluation and management service by telephone and the availability of in person appointments. I also discussed with the patient that there may be a patient responsible charge related to this service. The patient expressed understanding and agreed to proceed.    I connected with David [name] on 06/09/20  at  10:30 AM EST  EDT by telephone and verified that I am speaking with the correct person using two identifiers.   Consent I discussed the limitations, risks, security and privacy concerns of performing an evaluation and management service by telephone and the availability of in person appointments. I also discussed with the patient that there may be a patient responsible charge related to this service. The patient expressed understanding and agreed to proceed.   Location of Patient: Private Residence   Location of Provider: [place] participating in Telemedicine visit: Geryl Rankins FNP-BC [place]    History of Present Illness: Telemedicine visit for: Reestablish care I have not seen David [name] personally since November 02, 2018 He has an appointment with Dr. Kyla Balzarine office today (Cardiology)  He has a past medical history of Cardiomyopathy, Chronic anticoagulation due to PAF (09/30/2017), poorly controlled diabetes mellitus type 2, nonadherence to medication regimen, Essential hypertension (09/26/2017), and Sleep apnea suspected (09/30/2017).   Endorses chronic right sided nasal congestion. Taking flonase with no relief of symptoms.  Believes he may have a nasal polyp and would like this evaluated at his next office visit.   Diabetes Mellitus 2 POORLY CONTROLLED>  He monitors blood glucose levels sporadically throughout the day.  He cannot recall any fasting levels but endorses average readings postprandial 150s.  Currently administering Novolog 7 units TID and Levemir 16 units.  LDL is not at goal with atorvastatin 20 mg daily.  Endorses hyperglycemic symptoms of polyneuropathy in his feet. Lab Results  Component Value Date   HGBA1C 9.7 (H) 08/26/2019   Lab Results  Component Value Date   LDLCALC 161 (H) 10/18/2018    ESSENTIAL HYPERTENSION Notes average home readings 110-130/70-90s. Denies chest pain, shortness of breath, palpitations, lightheadedness, dizziness, headaches or BLE edema.  Current prescribed medications include carvedilol 25 mg twice daily, Entresto 24-26 mg twice daily.  For his cardiomyopathy he also takes spironolactone 12.5 mg daily, digoxin 0.125 mg daily and furosemide 40 mg daily. BP Readings from Last 3 Encounters:  09/25/19 132/85  08/30/19 121/90  10/30/18 112/70    Sees Dr. Johnsie Cancel today for refill of eliquis. He has been "doubling up" on ASA since he has been out of Eliquis. He has missed several appts with cardiology and therefore refill requests were denied.   Past Medical History:  Diagnosis Date   Cardiomyopathy ([place])    a. EF 45% in 2019.   Chronic anticoagulation 09/30/2017   Diabetes mellitus type 2 in nonobese [facility])    Does not have health insurance    Essential hypertension 09/26/2017   Financial difficulties    H/O noncompliance with medical treatment, presenting hazards to health    Non-insulin treated type 2 diabetes mellitus ([place]) 09/26/2017   Persistent atrial fibrillation ([place])    Sleep apnea suspected 09/30/2017    Past Surgical History:  Procedure Laterality Date   APPENDECTOMY  1971   CARDIOVERSION N/A 09/28/2017   Procedure: CARDIOVERSION;  Surgeon: Sanda Klein, MD;  Location: [facility] ENDOSCOPY;  Service: Cardiovascular;  Laterality: N/A;   TEE WITHOUT CARDIOVERSION N/A 09/28/2017    Procedure: TRANSESOPHAGEAL ECHOCARDIOGRAM (TEE);  Surgeon: Sanda Klein, MD;  Location: [facility] ENDOSCOPY;  Service: Cardiovascular;  Laterality: N/A;    Family History  Problem Relation Age of Onset   Hypertension Mother     Social History   Socioeconomic History   Marital status: Married    Spouse name: Not on file   Number of children: Not on file   Years of education: Not on file   Highest education level: Not on file  Occupational History   Not on file  Tobacco Use   Smoking status: Former Smoker    Years: 15.00    Types: Cigarettes    Quit date: 03/18/2018    Years since quitting: 2.2   Smokeless tobacco: Never Used   Tobacco comment: 09/26/2017 "2-3 cigarettes/month now"  Vaping Use   Vaping Use: Never used  Substance and Sexual Activity   Alcohol use: Yes    Alcohol/week: 3.0 standard drinks    Types: 3 Cans of beer per week   Drug use: Never   Sexual activity: Not Currently  Other Topics Concern   Not on file  Social History Narrative   Not on file   Social Determinants of Health   Financial Resource Strain: Not on file  Food Insecurity: Not on file  Transportation Needs: Not on file  Physical Activity: Not on file  Stress: Not on file  Social Connections: Not on file     Observations/Objective: Awake, alert and oriented x 3   Review of Systems  Constitutional: Negative for fever, malaise/fatigue and weight loss.  HENT: Negative.  Negative for nosebleeds.   Eyes: Negative.  Negative for blurred vision, double vision and photophobia.  Respiratory: Negative.  Negative for cough and shortness of breath.   Cardiovascular: Negative.  Negative for chest pain, palpitations and leg swelling.  Gastrointestinal: Negative.  Negative for heartburn, nausea and vomiting.  Musculoskeletal: Negative.  Negative for myalgias.  Neurological: Negative.  Negative for dizziness, focal weakness, seizures and headaches.  Psychiatric/Behavioral: Negative.   Negative for suicidal ideas.    Assessment and Plan: Diagnoses and all orders for this visit:  Essential hypertension -     carvedilol (COREG) 25 MG tablet; Take 1 tablet (25 mg total) by mouth 2 (two) times daily. -     spironolactone (ALDACTONE) 25 MG tablet; Take 0.5 tablets (12.5 mg total) by mouth daily. -     CMP14+EGFR; Future Continue all antihypertensives as prescribed.  Remember to bring in your blood pressure log with you for your follow up appointment.  DASH/Mediterranean Diets are healthier choices for HTN.    Type 2 diabetes mellitus with hyperglycemia, with long-term current use of insulin (HCC) -     insulin detemir (LEVEMIR) 100 UNIT/ML FlexPen; Inject 16 Units into the skin at bedtime. -     Insulin Pen Needle (B-D UF III MINI PEN NEEDLES) 31G X 5 MM MISC; Use as instructed. Inject into the skin 4 times per day -     TRUEplus Lancets 28G MISC; Use as instructed. Check blood glucose level by fingerstick 3 times per day. -     Hemoglobin A1c; Future -     Microalbumin / creatinine urine ratio; Future Continue blood sugar control as discussed in office today, low carbohydrate diet, and regular physical exercise  as tolerated, 150 minutes per week (30 min each day, 5 days per week, or 50 min 3 days per week). Keep blood sugar logs with fasting goal of 90-130 mg/dl, post prandial (after you eat) less than 180.  For Hypoglycemia: BS <60 and Hyperglycemia BS >400; contact the clinic ASAP. Annual eye exams and foot exams are recommended.   Chronic anticoagulation -     CBC; Future  Cardiomyopathy, unspecified type (HCC) -     furosemide (LASIX) 40 MG tablet; Take 1 tablet (40 mg total) by mouth daily. -     digoxin (LANOXIN) 0.125 MG tablet; Take 1 tablet (125 mcg total) by mouth daily. DASH DIET  Dyslipidemia, goal LDL below 70 -     atorvastatin (LIPITOR) 20 MG tablet; Take 1 tablet (20 mg total) by mouth daily. -     Lipid panel; Future INSTRUCTIONS: Work on a low fat,  heart healthy diet and participate in regular aerobic exercise program by working out at least 150 minutes per week; 5 days a week-30 minutes per day. Avoid red meat/beef/steak,  fried foods. junk foods, sodas, sugary drinks, unhealthy snacking, alcohol and smoking.  Drink at least 80 oz of water per day and monitor your carbohydrate intake daily.    Need for hepatitis C screening test -     HCV Ab w Reflex to Quant PCR; Future  Colon cancer screening -     Fecal occult blood, imunochemical; Future     Follow Up Instructions Return in about 6 weeks (around 07/24/2020).     I discussed the assessment and treatment plan with the patient. The patient was provided an opportunity to ask questions and all were answered. The patient agreed with the plan and demonstrated an understanding of the instructions.   The patient was advised to call back or seek an in-person evaluation if the symptoms worsen or if the condition fails to improve as anticipated.  I provided 20 minutes of non-face-to-face time during this encounter including median intraservice time, reviewing previous notes, labs, imaging, medications and explaining diagnosis and management.  David Pounds, FNP-BC

## 2020-06-09 NOTE — Patient Instructions (Addendum)
Medication Instructions:  Your physician recommends that you continue on your current medications as directed. Please refer to the Current Medication list given to you today.  *If you need a refill on your cardiac medications before your next appointment, please call your pharmacy*   Lab Work: Will call community health wellness to schedule a digoxin level. The day of your labs do not take your digoxin.   If you have labs (blood work) drawn today and your tests are completely normal, you will receive your results only by: Marland Kitchen MyChart Message (if you have MyChart) OR . A paper copy in the mail If you have any lab test that is abnormal or we need to change your treatment, we will call you to review the results.   Testing/Procedures: Your physician has requested that you have an echocardiogram. Echocardiography is a painless test that uses sound waves to create images of your heart. It provides your doctor with information about the size and shape of your heart and how well your heart's chambers and valves are working. This procedure takes approximately one hour. There are no restrictions for this procedure.     Follow-Up: At [facility], you and your health needs are our priority.  As part of our continuing mission to provide you with exceptional heart care, we have created designated Provider Care Teams.  These Care Teams include your primary Cardiologist (physician) and Advanced Practice Providers (APPs -  Physician Assistants and Nurse Practitioners) who all work together to provide you with the care you need, when you need it.  We recommend signing up for the patient portal called "MyChart".  Sign up information is provided on this After Visit Summary.  MyChart is used to connect with patients for Virtual Visits (Telemedicine).  Patients are able to view lab/test results, encounter notes, upcoming appointments, etc.  Non-urgent messages can be sent to your provider as well.   To learn  more about what you can do with MyChart, go to [email or web address].    Your next appointment:   3 month(s)  The format for your next appointment:   In Person  Provider:   You will see one of the following Advanced Practice Providers on your designated Care Team:    Tereso Newcomer, [place]    Other Instructions Patient has appointment with HTN clinic bring cuff and bp readings.

## 2020-06-09 NOTE — Progress Notes (Signed)
Cardiology Office Note:    Date:  06/09/2020   ID:  [name], DOB [birth year], MRN [id number]  PCP:  Claiborne Rigg, NP   [facility]  Cardiologist:  Charlton Haws, MD   Electrophysiologist:  None       Referring MD: Claiborne Rigg, NP   Chief Complaint:  Follow-up (CHF, AFib)    Patient Profile:    David [name] is a 60 y.o. male with:   Diabetes mellitus   Persistent atrial fibrillation   Failed DCCV in 09/2017  Attempted Sotalol with plan to repeat DCCV; DCCV not done/pt stopped meds   (HFrEF) heart failure with reduced ejection fraction   Probable tachycardia mediated vs HTN  Myoview in 2019 with ?mild anterolat ischemia - Focus was on Rx of AF at that time; no report CP  Suspected OSA   Hypertension   Prior CV studies: Echocardiogram 10/17/2018 EF 35-40, moderate LVH, moderately reduced RVSF, mild to moderate MR, RVSP 36.3  Myoview 09/29/2017 Mild mid anterolateral reversibility-equivocal finding, EF 44; intermediate risk Reviewed by Dr. Eden Emms - no infarct; ?mild ant-lat ischemia >> focus on Rx of AFib at that time  History of Present Illness:    David [name] was last seen in clinic by Norma Fredrickson, NP in 10/2018.  He had a prior hx of AFib with failed DCCV.  He was placed on Sotalol with plans to repeat DCCV. However, he never returned for the procedure and stopped his medications.  He was admitted in 10/2018 with AF with RVR and low EF.  The plan at that time was to attempt rate control and repeat his echocardiogram 3 mos later to check for improvement.  if his EF did not improve, he would need an ischemic evaluation.  He has not returned to clinic since.    He has contacted the office recently for samples of Apixaban.  Assistance paperwork has been filled out.  he is seen for f/u.  He is here alone.  He has been feeling well.  He has not had chest pain, shortness of breath, syncope, orthopnea, leg edema.  He has not received apixaban yet  and ran out 10 days ago.      Past Medical History:  Diagnosis Date  . Cardiomyopathy (HCC)    a. EF 45% in 2019.  Marland Kitchen Chronic anticoagulation 09/30/2017  . Diabetes mellitus type 2 in nonobese (HCC)   . Does not have health insurance   . Essential hypertension 09/26/2017  . Financial difficulties   . H/O noncompliance with medical treatment, presenting hazards to health   . Non-insulin treated type 2 diabetes mellitus (HCC) 09/26/2017  . Persistent atrial fibrillation (HCC)   . Sleep apnea suspected 09/30/2017    Current Medications: Current Meds  Medication Sig  . apixaban (ELIQUIS) 5 MG TABS tablet Take 1 tablet (5 mg total) by mouth 2 (two) times daily.  Marland Kitchen atorvastatin (LIPITOR) 20 MG tablet Take 1 tablet (20 mg total) by mouth daily.  . carvedilol (COREG) 25 MG tablet Take 1 tablet (25 mg total) by mouth 2 (two) times daily.  . digoxin (LANOXIN) 0.125 MG tablet Take 1 tablet (125 mcg total) by mouth daily.  . furosemide (LASIX) 40 MG tablet Take 1 tablet (40 mg total) by mouth daily.  Marland Kitchen ibuprofen (ADVIL) 200 MG tablet Take 2 tablets (400 mg total) by mouth every 6 (six) hours as needed for mild pain.  Marland Kitchen insulin aspart (NOVOLOG) 100 UNIT/ML FlexPen Inject 6 Units  into the skin in the morning, at noon, in the evening, and at bedtime.  . insulin detemir (LEVEMIR) 100 UNIT/ML FlexPen Inject 16 Units into the skin at bedtime.  . Insulin Pen Needle (B-D UF III MINI PEN NEEDLES) 31G X 5 MM MISC Use as instructed. Inject into the skin 4 times per day  . sacubitril-valsartan (ENTRESTO) 24-26 MG Take 1 tablet by mouth 2 (two) times daily.  . sodium chloride (OCEAN) 0.65 % SOLN nasal spray Place 1 spray into both nostrils as needed for congestion.  Marland Kitchen spironolactone (ALDACTONE) 25 MG tablet Take 0.5 tablets (12.5 mg total) by mouth daily.  . TRUEplus Lancets 28G MISC Use as instructed. Check blood glucose level by fingerstick 3 times per day.     Allergies:   Patient has no known allergies.    Social History   Tobacco Use  . Smoking status: Former Smoker    Years: 15.00    Types: Cigarettes    Quit date: 03/18/2018    Years since quitting: 2.2  . Smokeless tobacco: Never Used  . Tobacco comment: 09/26/2017 "2-3 cigarettes/month now"  Vaping Use  . Vaping Use: Never used  Substance Use Topics  . Alcohol use: Yes    Alcohol/week: 3.0 standard drinks    Types: 3 Cans of beer per week  . Drug use: Never     Family Hx: The patient's family history includes Hypertension in his mother.  ROS   EKGs/Labs/Other Test Reviewed:    EKG:  EKG is   ordered today.  The ekg ordered today demonstrates atrial fibrillation, HR 76, normal axis, non-specific ST-TW changes, QTc 479 ms  Recent Labs: 08/27/2019: Hemoglobin 13.8; Platelets 192 08/30/2019: BUN 22; Creatinine, Ser 1.19; Potassium 3.5; Sodium 134   Recent Lipid Panel Lab Results  Component Value Date/Time   CHOL 215 (H) 10/18/2018 07:40 AM   TRIG 103 10/18/2018 07:40 AM   HDL 33 (L) 10/18/2018 07:40 AM   CHOLHDL 6.5 10/18/2018 07:40 AM   LDLCALC 161 (H) 10/18/2018 07:40 AM      Risk Assessment/Calculations:    CHA2DS2-VASc Score = 3  This indicates a 3.2% annual risk of stroke. The patient's score is based upon: CHF History: Yes HTN History: Yes Diabetes History: Yes Stroke History: No Vascular Disease History: No Age Score: 0 Gender Score: 0     Physical Exam:    VS:  BP (!) 150/62   Pulse 76   Ht 6' (1.829 m)   Wt 191 lb (86.6 kg)   SpO2 96%   BMI 25.90 kg/m     Wt Readings from Last 3 Encounters:  06/09/20 191 lb (86.6 kg)  09/25/19 191 lb (86.6 kg)  08/26/19 180 lb (81.6 kg)     Constitutional:      Appearance: Healthy appearance. Not in distress.  Neck:     Vascular: No JVR. JVD normal.  Pulmonary:     Effort: Pulmonary effort is normal.     Breath sounds: No wheezing. No rales.  Cardiovascular:     Normal rate. Irregularly irregular rhythm. Normal S1. Normal S2.     Murmurs:  There is no murmur.  Edema:    Peripheral edema absent.  Abdominal:     Palpations: Abdomen is soft. There is no hepatomegaly.  Skin:    General: Skin is warm and dry.  Neurological:     General: No focal deficit present.     Mental Status: Alert and oriented to person, place and time.  Cranial Nerves: Cranial nerves are intact.       ASSESSMENT & PLAN:    1. Persistent atrial fibrillation (HCC) HR is well controlled now.  He is asymptomatic.  We discussed rate control vs rhythm control as strategies for managing AFib.  As he is asymptomatic, it is reasonable to pursue a rate control strategy.  Continue current dose of carvedilol, digoxin, apixaban. We will get him samples of Apixaban and check on his assistance paperwork.  He has labs pending with primary care.  We will try to add a digoxin level on to his labs.  F/u in 3 mos.   2. HFrEF (heart failure with reduced ejection fraction) (HCC) EF 35-40.  NYHA I-II.  Volume status stable.  It was felt that his cardiomyopathy was secondary to tachycardia.  His rate is well controlled.  His blood pressure is somewhat elevated.  I will have him follow-up with our hypertension clinic in 2 weeks to check his blood pressure cuff and follow-up on his blood pressure.  If his BP is above target, I would suggest increasing his Entresto to 49/51 mg twice daily.  Continue current dose of carvedilol, spironolactone, digoxin.  If A1c is above target, it would be reasonable to add dapagliflozin.  3. Essential hypertension Blood pressure is elevated today.  He notes his blood pressures at home are optimal.  I have asked him to monitor his blood pressure over the next couple weeks and return for hypertension clinic follow-up.  He should bring his blood pressure cuff with him.  As noted above, if his BP is above target, increase Entresto.  4. Insulin dependent type 2 diabetes mellitus (HCC) Followed by primary care.  A1c is pending.  As noted, dapagliflozin  can be added to his medical regimen for both heart failure management and diabetes management.      Dispo:  Return in about 3 months (around 09/09/2020) for Routine Follow Up w/ Dr. Eden Emms, or Tereso Newcomer, PA-C, in person.   Medication Adjustments/Labs and Tests Ordered: Current medicines are reviewed at length with the patient today.  Concerns regarding medicines are outlined above.  Tests Ordered: Orders Placed This Encounter  Procedures  . EKG 12-Lead  . ECHOCARDIOGRAM COMPLETE   Medication Changes: No orders of the defined types were placed in this encounter.   Signed, Tereso Newcomer, PA-C  06/09/2020 4:48 PM    [facility] [address] [place] , [place], [place]  [address] Phone: [phone]; Fax: [phone]

## 2020-06-10 ENCOUNTER — Telehealth: Payer: Self-pay | Admitting: *Deleted

## 2020-06-10 ENCOUNTER — Other Ambulatory Visit: Payer: Self-pay | Admitting: *Deleted

## 2020-06-10 DIAGNOSIS — I4819 Other persistent atrial fibrillation: Secondary | ICD-10-CM

## 2020-06-10 NOTE — Telephone Encounter (Signed)
S/w pt to let pt know [facility] does not do outside labs.  Scheduled pt lab appt for 3/24 the same day as HTN clinic pt is aware to hold dig that day. Appt made, orders linked.  Pt received Eliquis in the mail yesterday.

## 2020-06-25 ENCOUNTER — Other Ambulatory Visit: Payer: Self-pay

## 2020-06-25 ENCOUNTER — Ambulatory Visit: Payer: Self-pay

## 2020-06-26 ENCOUNTER — Other Ambulatory Visit: Payer: Self-pay

## 2020-07-08 ENCOUNTER — Other Ambulatory Visit: Payer: Self-pay

## 2020-07-10 ENCOUNTER — Ambulatory Visit: Payer: Self-pay

## 2020-07-10 ENCOUNTER — Other Ambulatory Visit: Payer: Self-pay

## 2020-07-10 NOTE — Progress Notes (Deleted)
Patient ID: David [name]                 DOB: 10-Jan-1961                      MRN: [id number]     HPI: David [name] is a 60 y.o. male patient of Dr.Nishan's referred by Lilian Coma to HTN clinic. PMH is significant for DM, afib, HFrEF, OSA and HTN. Patient has a history of non-compliance and poor follow up. He was seen on 06/09/20 by Lorin Picket. His BP was elevated in clinic but noted his BP at home was well controlled. He was asked to bring his blood pressure cuff and reading to HTN clinic.   Increase Entresto Add dapa Increase spiro Dizziness, lightheadedness, headache, blurred vision, SOB, swelling Home bp? HR? Labs today  Current HTN/CHF meds: carvedilol 25mg  twice a day,digoxin 0.125mg  daily, furosemide 40mg  daily, Entresto 24-26mg  twice a day, spironolactone 12.5mg  daily  Previously tried:  BP goal: <130/80  Family History: The patient's family history includes Hypertension in his mother.  Social History:   Diet:   Exercise:   Home BP readings:   Wt Readings from Last 3 Encounters:  06/09/20 191 lb (86.6 kg)  09/25/19 191 lb (86.6 kg)  08/26/19 180 lb (81.6 kg)   BP Readings from Last 3 Encounters:  06/09/20 (!) 150/62  09/25/19 132/85  08/30/19 121/90   Pulse Readings from Last 3 Encounters:  06/09/20 76  09/25/19 67  08/30/19 66    Renal function: CrCl cannot be calculated (Patient's most recent lab result is older than the maximum 21 days allowed.).  Past Medical History:  Diagnosis Date  . Cardiomyopathy (HCC)    a. EF 45% in 2019.  09/01/19 Chronic anticoagulation 09/30/2017  . Diabetes mellitus type 2 in nonobese (HCC)   . Does not have health insurance   . Essential hypertension 09/26/2017  . Financial difficulties   . H/O noncompliance with medical treatment, presenting hazards to health   . Non-insulin treated type 2 diabetes mellitus (HCC) 09/26/2017  . Persistent atrial fibrillation (HCC)   . Sleep apnea suspected 09/30/2017    Current Outpatient  Medications on File Prior to Visit  Medication Sig Dispense Refill  . apixaban (ELIQUIS) 5 MG TABS tablet Take 1 tablet (5 mg total) by mouth 2 (two) times daily. 180 tablet 0  . atorvastatin (LIPITOR) 20 MG tablet Take 1 tablet (20 mg total) by mouth daily. 90 tablet 0  . carvedilol (COREG) 25 MG tablet Take 1 tablet (25 mg total) by mouth 2 (two) times daily. 180 tablet 0  . digoxin (LANOXIN) 0.125 MG tablet Take 1 tablet (125 mcg total) by mouth daily. 90 tablet 0  . furosemide (LASIX) 40 MG tablet Take 1 tablet (40 mg total) by mouth daily. 90 tablet 0  . ibuprofen (ADVIL) 200 MG tablet Take 2 tablets (400 mg total) by mouth every 6 (six) hours as needed for mild pain. 30 tablet 0  . insulin aspart (NOVOLOG) 100 UNIT/ML FlexPen Inject 6 Units into the skin in the morning, at noon, in the evening, and at bedtime. 3 pen 2  . insulin detemir (LEVEMIR) 100 UNIT/ML FlexPen Inject 16 Units into the skin at bedtime. 6 mL 0  . Insulin Pen Needle (B-D UF III MINI PEN NEEDLES) 31G X 5 MM MISC Use as instructed. Inject into the skin 4 times per day 200 each 3  . sacubitril-valsartan (ENTRESTO) 24-26 MG Take 1 tablet  by mouth 2 (two) times daily. 60 tablet 11  . sodium chloride (OCEAN) 0.65 % SOLN nasal spray Place 1 spray into both nostrils as needed for congestion.    Marland Kitchen spironolactone (ALDACTONE) 25 MG tablet Take 0.5 tablets (12.5 mg total) by mouth daily. 15 tablet 0  . TRUEplus Lancets 28G MISC Use as instructed. Check blood glucose level by fingerstick 3 times per day. 200 each 3   No current facility-administered medications on file prior to visit.    No Known Allergies   Assessment/Plan:  1. Hypertension -    Olene Floss, Pharm.D, BCPS, CPP [facility]  [address], [place], [place] [address]  Phone: [phone]; Fax: [phone]

## 2020-07-21 ENCOUNTER — Other Ambulatory Visit (HOSPITAL_COMMUNITY): Payer: Self-pay

## 2020-07-29 ENCOUNTER — Other Ambulatory Visit: Payer: Self-pay

## 2020-07-29 ENCOUNTER — Other Ambulatory Visit: Payer: Self-pay | Admitting: Nurse Practitioner

## 2020-07-29 MED FILL — Carvedilol Tab 25 MG: ORAL | 30 days supply | Qty: 60 | Fill #0 | Status: AC

## 2020-07-29 MED FILL — Furosemide Tab 40 MG: ORAL | 30 days supply | Qty: 30 | Fill #0 | Status: AC

## 2020-07-29 MED FILL — Atorvastatin Calcium Tab 20 MG (Base Equivalent): ORAL | 30 days supply | Qty: 90 | Fill #0 | Status: AC

## 2020-07-29 MED FILL — Insulin Pen Needle 31 G X 5 MM (1/5" or 3/16"): 25 days supply | Qty: 100 | Fill #0 | Status: AC

## 2020-07-29 NOTE — Telephone Encounter (Signed)
Notes to clinic: medication was last filled on 06/09/2020 Patient should have been out of medication on 07/10/2020 Patient has appointment on 08/12/2020 Review for refill until that time   Requested Prescriptions  Pending Prescriptions Disp Refills   spironolactone (ALDACTONE) 25 MG tablet 15 tablet 0    Sig: TAKE 0.5 TABLETS (12.5 MG TOTAL) BY MOUTH DAILY.      Cardiovascular: Diuretics - Aldosterone Antagonist Failed - 07/29/2020  1:25 PM      Failed - Na in normal range and within 360 days    Sodium  Date Value Ref Range Status  08/30/2019 134 (L) 135 - 145 mmol/L Final  11/16/2018 135 134 - 144 mmol/L Final          Failed - Last BP in normal range    BP Readings from Last 1 Encounters:  06/09/20 (!) 150/62          Passed - Cr in normal range and within 360 days    Creatinine, Ser  Date Value Ref Range Status  08/30/2019 1.19 0.61 - 1.24 mg/dL Final          Passed - K in normal range and within 360 days    Potassium  Date Value Ref Range Status  08/30/2019 3.5 3.5 - 5.1 mmol/L Final          Passed - Valid encounter within last 6 months    Recent Outpatient Visits           1 month ago Essential hypertension   [place] [facility] [place], Shea Stakes, NP   10 months ago Diabetes mellitus type 2, uncontrolled, with complications [facility])   [place] [facility] [place], [place], [place]   1 year ago Diabetes mellitus type 2, uncontrolled, with complications [facility])   [place] [facility] Claiborne Rigg, NP       Future Appointments             In 2 weeks Claiborne Rigg, NP [employer] [facility]   In 1 month [place], [place] T, PA-C [facility], [facility]               insulin detemir (LEVEMIR FLEXTOUCH) 100 UNIT/ML FlexPen 6 mL 0    Sig: INJECT 16 UNITS INTO THE SKIN AT BEDTIME.      Endocrinology:  Diabetes - Insulins Failed - 07/29/2020  1:25 PM       Failed - HBA1C is between 0 and 7.9 and within 180 days    Hgb A1c MFr Bld  Date Value Ref Range Status  08/26/2019 9.7 (H) 4.8 - 5.6 % Final    Comment:    (NOTE) Pre diabetes:          5.7%-6.4% Diabetes:              >6.4% Glycemic control for   <7.0% adults with diabetes           Passed - Valid encounter within last 6 months    Recent Outpatient Visits           1 month ago Essential hypertension   [place] [facility] [place], [place] W, NP   10 months ago Diabetes mellitus type 2, uncontrolled, with complications [facility])   [place] [facility] [place], [place], [place]   1 year ago Diabetes mellitus type 2, uncontrolled, with complications (HCC)   [place]  [employer] [facility] [place], Shea Stakes, NP       Future Appointments             In 2 weeks Claiborne Rigg, NP [facility] [employer] [facility]   In 1 month Alben Spittle, Acupuncturist T, PA-C [facility] [employer], [facility]

## 2020-07-30 MED ORDER — LEVEMIR FLEXTOUCH 100 UNIT/ML ~~LOC~~ SOPN
PEN_INJECTOR | SUBCUTANEOUS | 2 refills | Status: DC
  Filled 2020-07-30: qty 6, 16d supply, fill #0
  Filled 2020-08-10: qty 6, 37d supply, fill #0
  Filled 2020-10-21: qty 6, 37d supply, fill #1

## 2020-07-30 MED ORDER — SPIRONOLACTONE 25 MG PO TABS
12.5000 mg | ORAL_TABLET | Freq: Every day | ORAL | 2 refills | Status: DC
Start: 2020-07-30 — End: 2020-10-23
  Filled 2020-07-30 – 2020-08-10 (×2): qty 15, 30d supply, fill #0
  Filled 2020-09-10: qty 15, 30d supply, fill #1
  Filled 2020-10-21: qty 15, 30d supply, fill #2

## 2020-07-31 ENCOUNTER — Other Ambulatory Visit: Payer: Self-pay

## 2020-08-06 ENCOUNTER — Telehealth (HOSPITAL_COMMUNITY): Payer: Self-pay | Admitting: Physician Assistant

## 2020-08-06 ENCOUNTER — Other Ambulatory Visit (HOSPITAL_COMMUNITY): Payer: Self-pay

## 2020-08-06 NOTE — Telephone Encounter (Signed)
Echocardiogram was cancelled by patient and he state he will call back to reschedule.  08/05/2020 4:22 PM [id number], David [name] cancelled x 2  Cancel Rsn: Patient (patient cancelled and will call later to reschedule-he has to work/[employer]) Pt cancelled echo for 07/21/20.   Order will be removed from the ECHO WQ and when patient calls back we can reinstate the order. Thank you.

## 2020-08-07 ENCOUNTER — Other Ambulatory Visit: Payer: Self-pay

## 2020-08-10 ENCOUNTER — Other Ambulatory Visit: Payer: Self-pay

## 2020-08-10 ENCOUNTER — Telehealth: Payer: Self-pay | Admitting: Nurse Practitioner

## 2020-08-10 NOTE — Telephone Encounter (Signed)
Provider Meredeth Ide working virtual 5/11. Called pt no answer. Left vm that appt is virtual. If in person is preferred please call [phone] to reschedule.

## 2020-08-12 ENCOUNTER — Telehealth: Payer: Self-pay | Admitting: Nurse Practitioner

## 2020-09-10 ENCOUNTER — Other Ambulatory Visit: Payer: Self-pay

## 2020-09-10 MED FILL — Atorvastatin Calcium Tab 20 MG (Base Equivalent): ORAL | 30 days supply | Qty: 30 | Fill #0 | Status: AC

## 2020-09-10 MED FILL — Carvedilol Tab 25 MG: ORAL | 30 days supply | Qty: 60 | Fill #1 | Status: AC

## 2020-09-10 MED FILL — Furosemide Tab 40 MG: ORAL | 30 days supply | Qty: 30 | Fill #1 | Status: AC

## 2020-09-11 ENCOUNTER — Other Ambulatory Visit: Payer: Self-pay

## 2020-09-16 ENCOUNTER — Ambulatory Visit: Payer: Self-pay | Admitting: Physician Assistant

## 2020-09-16 ENCOUNTER — Ambulatory Visit: Payer: Self-pay | Admitting: Family Medicine

## 2020-09-22 ENCOUNTER — Other Ambulatory Visit: Payer: Self-pay | Admitting: Pharmacist

## 2020-09-22 ENCOUNTER — Other Ambulatory Visit: Payer: Self-pay

## 2020-09-22 DIAGNOSIS — I10 Essential (primary) hypertension: Secondary | ICD-10-CM

## 2020-09-22 MED ORDER — SACUBITRIL-VALSARTAN 24-26 MG PO TABS: 1.0000 | ORAL_TABLET | Freq: Two times a day (BID) | ORAL | 0 refills | Status: DC

## 2020-10-21 ENCOUNTER — Other Ambulatory Visit: Payer: Self-pay | Admitting: Nurse Practitioner

## 2020-10-21 ENCOUNTER — Other Ambulatory Visit: Payer: Self-pay

## 2020-10-21 DIAGNOSIS — I429 Cardiomyopathy, unspecified: Secondary | ICD-10-CM

## 2020-10-21 NOTE — Telephone Encounter (Signed)
Notes to clinic:  Patient has appt on 11/16/2020 Review for 90 day supply    Requested Prescriptions  Pending Prescriptions Disp Refills   digoxin (LANOXIN) 0.125 MG tablet 90 tablet 0    Sig: Take 1 tablet (125 mcg total) by mouth daily.      Cardiovascular:  Antiarrhythmic Agents - digoxin Failed - 10/21/2020  9:24 AM      Failed - Cr in normal range and within 180 days    Creatinine, Ser  Date Value Ref Range Status  08/30/2019 1.19 0.61 - 1.24 mg/dL Final          Failed - Digoxin (serum) in normal range and within 180 days    No results found for: DIGOXIN        Failed - Ca in normal range and within 180 days    Calcium  Date Value Ref Range Status  08/30/2019 8.7 (L) 8.9 - 10.3 mg/dL Final          Failed - K in normal range and within 180 days    Potassium  Date Value Ref Range Status  08/30/2019 3.5 3.5 - 5.1 mmol/L Final          Passed - Last Heart Rate in normal range    Pulse Readings from Last 1 Encounters:  06/09/20 76          Passed - Valid encounter within last 6 months    Recent Outpatient Visits           4 months ago Essential hypertension   [place] [facility] [place], Shea Stakes, NP   1 year ago Diabetes mellitus type 2, uncontrolled, with complications [facility])   [place] [facility] [place], [place], [place]   1 year ago Diabetes mellitus type 2, uncontrolled, with complications [facility])   [place] [facility] [place], Shea Stakes, NP       Future Appointments             In 3 weeks Claiborne Rigg, NP [employer] [employer] [facility]   In 2 months Dyann Kief, PA-C [facility], [facility]               carvedilol (COREG) 25 MG tablet 180 tablet 0    Sig: TAKE 1 TABLET (25 MG TOTAL) BY MOUTH 2 (TWO) TIMES DAILY.      Cardiovascular:  Beta Blockers Failed - 10/21/2020  9:24 AM      Failed - Last BP in normal range    BP Readings from  Last 1 Encounters:  06/09/20 (!) 150/62          Passed - Last Heart Rate in normal range    Pulse Readings from Last 1 Encounters:  06/09/20 76          Passed - Valid encounter within last 6 months    Recent Outpatient Visits           4 months ago Essential hypertension   [place] [facility] [place], Shea Stakes, NP   1 year ago Diabetes mellitus type 2, uncontrolled, with complications [facility])   [place] [facility] [place], [place], [place]   1 year ago Diabetes mellitus type 2, uncontrolled, with complications [facility])   [place] [facility] Claiborne Rigg, NP       Future Appointments  In 3 weeks Claiborne Rigg, NP [facility]   In 2 months Dyann Kief, PA-C [facility] [employer], [facility]               furosemide (LASIX) 40 MG tablet 90 tablet 0    Sig: TAKE 1 TABLET (40 MG TOTAL) BY MOUTH DAILY.      Cardiovascular:  Diuretics - Loop Failed - 10/21/2020  9:24 AM      Failed - K in normal range and within 360 days    Potassium  Date Value Ref Range Status  08/30/2019 3.5 3.5 - 5.1 mmol/L Final          Failed - Ca in normal range and within 360 days    Calcium  Date Value Ref Range Status  08/30/2019 8.7 (L) 8.9 - 10.3 mg/dL Final          Failed - Na in normal range and within 360 days    Sodium  Date Value Ref Range Status  08/30/2019 134 (L) 135 - 145 mmol/L Final  11/16/2018 135 134 - 144 mmol/L Final          Failed - Cr in normal range and within 360 days    Creatinine, Ser  Date Value Ref Range Status  08/30/2019 1.19 0.61 - 1.24 mg/dL Final          Failed - Last BP in normal range    BP Readings from Last 1 Encounters:  06/09/20 (!) 150/62          Passed - Valid encounter within last 6 months    Recent Outpatient Visits           4 months ago Essential hypertension   [place] [facility] [place], Shea Stakes, NP   1 year ago Diabetes mellitus type 2, uncontrolled, with complications [facility])   [place] [facility] [place], [place], [place]   1 year ago Diabetes mellitus type 2, uncontrolled, with complications [facility])   [place] [facility] Claiborne Rigg, NP       Future Appointments             In 3 weeks Claiborne Rigg, NP [employer] [facility]   In 2 months [place], Tarri Abernethy, PA-C [facility], [facility]               atorvastatin (LIPITOR) 20 MG tablet 90 tablet 0    Sig: TAKE 1 TABLET (20 MG TOTAL) BY MOUTH DAILY.      Cardiovascular:  Antilipid - Statins Failed - 10/21/2020  9:24 AM      Failed - Total Cholesterol in normal range and within 360 days    Cholesterol  Date Value Ref Range Status  10/18/2018 215 (H) 0 - 200 mg/dL Final          Failed - LDL in normal range and within 360 days    LDL Cholesterol  Date Value Ref Range Status  10/18/2018 161 (H) 0 - 99 mg/dL Final    Comment:           Total Cholesterol/HDL:CHD Risk Coronary Heart Disease Risk Table                     Men   Women  1/2 Average Risk   3.4   3.3  Average Risk  5.0   4.4  2 X Average Risk   9.6   7.1  3 X Average Risk  23.4   11.0        Use the calculated Patient Ratio above and the CHD Risk Table to determine the patient's CHD Risk.        ATP III CLASSIFICATION (LDL):  <100     mg/dL   Optimal  810-175  mg/dL   Near or Above                    Optimal  130-159  mg/dL   Borderline  102-585  mg/dL   High  >277     mg/dL   Very High Performed at [facility], [address] [place], [place] [address]           Failed - HDL in normal range and within 360 days    HDL  Date Value Ref Range Status  10/18/2018 33 (L) >40 mg/dL Final          Failed - Triglycerides in normal range and within 360 days    Triglycerides  Date Value Ref Range Status  10/18/2018  103 <150 mg/dL Final          Passed - Patient is not pregnant      Passed - Valid encounter within last 12 months    Recent Outpatient Visits           4 months ago Essential hypertension   [place] [facility] [place], Shea Stakes, NP   1 year ago Diabetes mellitus type 2, uncontrolled, with complications [facility])   [place] [facility] Wellness [place], [place], [place]   1 year ago Diabetes mellitus type 2, uncontrolled, with complications [facility])   [place] [facility] [place], Shea Stakes, NP       Future Appointments             In 3 weeks Claiborne Rigg, NP [facility] [employer] [facility]   In 2 months Geni Bers, Tarri Abernethy, PA-C [facility] [employer], [facility]

## 2020-10-22 ENCOUNTER — Other Ambulatory Visit: Payer: Self-pay

## 2020-10-22 NOTE — Telephone Encounter (Signed)
Patient has not had labs done in >1 yr. Refills will have to be approved by PCP.

## 2020-10-23 ENCOUNTER — Other Ambulatory Visit: Payer: Self-pay | Admitting: Pharmacist

## 2020-10-23 ENCOUNTER — Other Ambulatory Visit: Payer: Self-pay

## 2020-10-23 DIAGNOSIS — I10 Essential (primary) hypertension: Secondary | ICD-10-CM

## 2020-10-23 DIAGNOSIS — E1165 Type 2 diabetes mellitus with hyperglycemia: Secondary | ICD-10-CM

## 2020-10-23 DIAGNOSIS — E785 Hyperlipidemia, unspecified: Secondary | ICD-10-CM

## 2020-10-23 DIAGNOSIS — IMO0002 Reserved for concepts with insufficient information to code with codable children: Secondary | ICD-10-CM

## 2020-10-23 DIAGNOSIS — I429 Cardiomyopathy, unspecified: Secondary | ICD-10-CM

## 2020-10-23 MED ORDER — FUROSEMIDE 40 MG PO TABS
40.0000 mg | ORAL_TABLET | Freq: Every day | ORAL | 0 refills | Status: DC
  Filled 2020-10-23: qty 30, 30d supply, fill #0

## 2020-10-23 MED ORDER — SACUBITRIL-VALSARTAN 24-26 MG PO TABS
1.0000 | ORAL_TABLET | Freq: Two times a day (BID) | ORAL | 0 refills | Status: DC
  Filled 2020-10-23: qty 60, 30d supply, fill #0

## 2020-10-23 MED ORDER — CARVEDILOL 25 MG PO TABS
25.0000 mg | ORAL_TABLET | Freq: Two times a day (BID) | ORAL | 0 refills | Status: DC
  Filled 2020-10-23: qty 60, 30d supply, fill #0

## 2020-10-23 MED ORDER — INSULIN ASPART 100 UNIT/ML FLEXPEN
6.0000 [IU] | PEN_INJECTOR | Freq: Four times a day (QID) | SUBCUTANEOUS | 0 refills | Status: DC
  Filled 2020-10-23: qty 6, 25d supply, fill #0

## 2020-10-23 MED ORDER — ATORVASTATIN CALCIUM 20 MG PO TABS
20.0000 mg | ORAL_TABLET | Freq: Every day | ORAL | 0 refills | Status: DC
  Filled 2020-10-23: qty 30, 30d supply, fill #0

## 2020-10-23 MED ORDER — SPIRONOLACTONE 25 MG PO TABS
12.5000 mg | ORAL_TABLET | Freq: Every day | ORAL | 0 refills | Status: DC
Start: 2020-10-23 — End: 2021-01-08
  Filled 2020-10-23 – 2020-12-02 (×2): qty 15, 30d supply, fill #0

## 2020-10-26 ENCOUNTER — Other Ambulatory Visit: Payer: Self-pay

## 2020-11-03 ENCOUNTER — Ambulatory Visit: Payer: Self-pay | Admitting: Nurse Practitioner

## 2020-11-16 ENCOUNTER — Ambulatory Visit: Payer: Self-pay | Admitting: Nurse Practitioner

## 2020-11-26 ENCOUNTER — Other Ambulatory Visit: Payer: Self-pay

## 2020-12-02 ENCOUNTER — Other Ambulatory Visit: Payer: Self-pay | Admitting: Family Medicine

## 2020-12-02 ENCOUNTER — Other Ambulatory Visit: Payer: Self-pay

## 2020-12-02 DIAGNOSIS — I10 Essential (primary) hypertension: Secondary | ICD-10-CM

## 2020-12-02 DIAGNOSIS — I429 Cardiomyopathy, unspecified: Secondary | ICD-10-CM

## 2020-12-02 DIAGNOSIS — E785 Hyperlipidemia, unspecified: Secondary | ICD-10-CM

## 2020-12-02 NOTE — Telephone Encounter (Signed)
Requested medication (s) are due for refill today -yes  Requested medication (s) are on the active medication list -yes  Future visit scheduled -yes  Last refill: 10/23/20  Notes to clinic: patient has not been seen since March- was to follow up in April- multiple cancelled appointments and overdue labs-request sent for review   Requested Prescriptions  Pending Prescriptions Disp Refills   carvedilol (COREG) 25 MG tablet 60 tablet 0    Sig: Take 1 tablet (25 mg total) by mouth 2 (two) times daily.     Cardiovascular:  Beta Blockers Failed - 12/02/2020  2:38 PM      Failed - Last BP in normal range    BP Readings from Last 1 Encounters:  06/09/20 (!) 150/62          Passed - Last Heart Rate in normal range    Pulse Readings from Last 1 Encounters:  06/09/20 76          Passed - Valid encounter within last 6 months    Recent Outpatient Visits           5 months ago Essential hypertension   [place] [facility] [place], Shea Stakes, NP   1 year ago Diabetes mellitus type 2, uncontrolled, with complications [facility])   [place] [facility] [place], [place], [place]   2 years ago Diabetes mellitus type 2, uncontrolled, with complications [facility])   [place] [facility] [place], Shea Stakes, NP       Future Appointments             In 2 weeks Geni Bers, Tarri Abernethy, PA-C [facility], [facility]   In 1 month [place], Shea Stakes, NP [place] [facility]             furosemide (LASIX) 40 MG tablet 30 tablet 0    Sig: Take 1 tablet (40 mg total) by mouth daily.     Cardiovascular:  Diuretics - Loop Failed - 12/02/2020  2:38 PM      Failed - K in normal range and within 360 days    Potassium  Date Value Ref Range Status  08/30/2019 3.5 3.5 - 5.1 mmol/L Final          Failed - Ca in normal range and within 360 days    Calcium  Date Value Ref Range Status  08/30/2019 8.7 (L) 8.9 - 10.3  mg/dL Final          Failed - Na in normal range and within 360 days    Sodium  Date Value Ref Range Status  08/30/2019 134 (L) 135 - 145 mmol/L Final  11/16/2018 135 134 - 144 mmol/L Final          Failed - Cr in normal range and within 360 days    Creatinine, Ser  Date Value Ref Range Status  08/30/2019 1.19 0.61 - 1.24 mg/dL Final          Failed - Last BP in normal range    BP Readings from Last 1 Encounters:  06/09/20 (!) 150/62          Passed - Valid encounter within last 6 months    Recent Outpatient Visits           5 months ago Essential hypertension    [facility] [place], [place] W, NP   1 year ago Diabetes mellitus type 2, uncontrolled, with  complications [facility](HCC)   [facility] [place], [place], [place]   2 years ago Diabetes mellitus type 2, uncontrolled, with complications [facility](HCC)   [place] [facility] [place], Shea StakesZelda W, NP       Future Appointments             In 2 weeks Geni BersLenze, Tarri AbernethyMichele M, PA-C [facility], [facility]   In 1 month [place], Shea StakesZelda W, NP [place] [facility]             atorvastatin (LIPITOR) 20 MG tablet 30 tablet 0    Sig: Take 1 tablet (20 mg total) by mouth daily.     Cardiovascular:  Antilipid - Statins Failed - 12/02/2020  2:38 PM      Failed - Total Cholesterol in normal range and within 360 days    Cholesterol  Date Value Ref Range Status  10/18/2018 215 (H) 0 - 200 mg/dL Final          Failed - LDL in normal range and within 360 days    LDL Cholesterol  Date Value Ref Range Status  10/18/2018 161 (H) 0 - 99 mg/dL Final    Comment:           Total Cholesterol/HDL:CHD Risk Coronary Heart Disease Risk Table                     Men   Women  1/2 Average Risk   3.4   3.3  Average Risk       5.0   4.4  2 X Average Risk   9.6   7.1  3 X Average Risk  23.4   11.0        Use the calculated Patient  Ratio above and the CHD Risk Table to determine the patient's CHD Risk.        ATP III CLASSIFICATION (LDL):  <100     mg/dL   Optimal  [id number]  mg/dL   Near or Above                    Optimal  130-159  mg/dL   Borderline  [id number]  mg/dL   High  >811>190     mg/dL   Very High Performed at [facility], [address] [place], [place] [address]           Failed - HDL in normal range and within 360 days    HDL  Date Value Ref Range Status  10/18/2018 33 (L) >40 mg/dL Final          Failed - Triglycerides in normal range and within 360 days    Triglycerides  Date Value Ref Range Status  10/18/2018 103 <150 mg/dL Final          Passed - Patient is not pregnant      Passed - Valid encounter within last 12 months    Recent Outpatient Visits           5 months ago Essential hypertension   [place] [facility] [place], Shea StakesZelda W, NP   1 year ago Diabetes mellitus type 2, uncontrolled, with complications [facility](HCC)   [place] [facility] [place], [place], [place]   2 years ago Diabetes mellitus type 2, uncontrolled, with complications [facility](HCC)   [facility] [place], Shea StakesZelda W, NP  Future Appointments             In 2 weeks Geni Bers, Tarri Abernethy, PA-C [facility] [address], [facility]   In 1 month [place], Shea Stakes, NP [employer] [facility] [employer] And Wellness               Requested Prescriptions  Pending Prescriptions Disp Refills   carvedilol (COREG) 25 MG tablet 60 tablet 0    Sig: Take 1 tablet (25 mg total) by mouth 2 (two) times daily.     Cardiovascular:  Beta Blockers Failed - 12/02/2020  2:38 PM      Failed - Last BP in normal range    BP Readings from Last 1 Encounters:  06/09/20 (!) 150/62          Passed - Last Heart Rate in normal range    Pulse Readings from Last 1 Encounters:  06/09/20 76          Passed - Valid encounter within last 6 months    Recent  Outpatient Visits           5 months ago Essential hypertension   [place] [facility] [place], Shea Stakes, NP   1 year ago Diabetes mellitus type 2, uncontrolled, with complications [facility])   [place] [facility] [place], [place], [place]   2 years ago Diabetes mellitus type 2, uncontrolled, with complications [facility])   [place] [facility] [place], Shea Stakes, NP       Future Appointments             In 2 weeks Geni Bers, Tarri Abernethy, PA-C [facility], [facility]   In 1 month [place], Shea Stakes, NP [place] [facility]             furosemide (LASIX) 40 MG tablet 30 tablet 0    Sig: Take 1 tablet (40 mg total) by mouth daily.     Cardiovascular:  Diuretics - Loop Failed - 12/02/2020  2:38 PM      Failed - K in normal range and within 360 days    Potassium  Date Value Ref Range Status  08/30/2019 3.5 3.5 - 5.1 mmol/L Final          Failed - Ca in normal range and within 360 days    Calcium  Date Value Ref Range Status  08/30/2019 8.7 (L) 8.9 - 10.3 mg/dL Final          Failed - Na in normal range and within 360 days    Sodium  Date Value Ref Range Status  08/30/2019 134 (L) 135 - 145 mmol/L Final  11/16/2018 135 134 - 144 mmol/L Final          Failed - Cr in normal range and within 360 days    Creatinine, Ser  Date Value Ref Range Status  08/30/2019 1.19 0.61 - 1.24 mg/dL Final          Failed - Last BP in normal range    BP Readings from Last 1 Encounters:  06/09/20 (!) 150/62          Passed - Valid encounter within last 6 months    Recent Outpatient Visits           5 months ago Essential hypertension   [place] [facility] [place], [place] W, NP   1 year ago Diabetes mellitus type 2, uncontrolled, with complications (  [facility])   [place] [facility] [place], [place], [place]   2 years ago Diabetes mellitus type 2,  uncontrolled, with complications [facility])   [place] [facility] [place], Shea Stakes, NP       Future Appointments             In 2 weeks Geni Bers, Tarri Abernethy, PA-C [facility], [facility]   In 1 month [place], Shea Stakes, NP [place] [facility]             atorvastatin (LIPITOR) 20 MG tablet 30 tablet 0    Sig: Take 1 tablet (20 mg total) by mouth daily.     Cardiovascular:  Antilipid - Statins Failed - 12/02/2020  2:38 PM      Failed - Total Cholesterol in normal range and within 360 days    Cholesterol  Date Value Ref Range Status  10/18/2018 215 (H) 0 - 200 mg/dL Final          Failed - LDL in normal range and within 360 days    LDL Cholesterol  Date Value Ref Range Status  10/18/2018 161 (H) 0 - 99 mg/dL Final    Comment:           Total Cholesterol/HDL:CHD Risk Coronary Heart Disease Risk Table                     Men   Women  1/2 Average Risk   3.4   3.3  Average Risk       5.0   4.4  2 X Average Risk   9.6   7.1  3 X Average Risk  23.4   11.0        Use the calculated Patient Ratio above and the CHD Risk Table to determine the patient's CHD Risk.        ATP III CLASSIFICATION (LDL):  <100     mg/dL   Optimal  480-165  mg/dL   Near or Above                    Optimal  130-159  mg/dL   Borderline  537-482  mg/dL   High  >707     mg/dL   Very High Performed at [facility], [address] [place], [place] [address]           Failed - HDL in normal range and within 360 days    HDL  Date Value Ref Range Status  10/18/2018 33 (L) >40 mg/dL Final          Failed - Triglycerides in normal range and within 360 days    Triglycerides  Date Value Ref Range Status  10/18/2018 103 <150 mg/dL Final          Passed - Patient is not pregnant      Passed - Valid encounter within last 12 months    Recent Outpatient Visits           5 months ago Essential hypertension   [place] [facility] [place], Shea Stakes, NP   1 year ago Diabetes mellitus type 2, uncontrolled, with complications [facility])   [place] [facility] Wellness [place], [place], [place]   2 years ago Diabetes mellitus type 2, uncontrolled, with complications [name])   [name] Wellness [place], Shea Stakes, NP  Future Appointments             In 2 weeks Geni Bers, Tarri Abernethy, PA-C [facility] [address], [facility]   In 1 month Claiborne Rigg, NP [facility]

## 2020-12-08 ENCOUNTER — Other Ambulatory Visit: Payer: Self-pay | Admitting: Family Medicine

## 2020-12-08 ENCOUNTER — Other Ambulatory Visit: Payer: Self-pay

## 2020-12-08 DIAGNOSIS — I10 Essential (primary) hypertension: Secondary | ICD-10-CM

## 2020-12-08 DIAGNOSIS — E785 Hyperlipidemia, unspecified: Secondary | ICD-10-CM

## 2020-12-08 DIAGNOSIS — I429 Cardiomyopathy, unspecified: Secondary | ICD-10-CM

## 2020-12-09 ENCOUNTER — Other Ambulatory Visit: Payer: Self-pay

## 2020-12-09 MED ORDER — FUROSEMIDE 40 MG PO TABS
40.0000 mg | ORAL_TABLET | Freq: Every day | ORAL | 0 refills | Status: DC
  Filled 2020-12-09: qty 30, 30d supply, fill #0

## 2020-12-09 MED ORDER — ATORVASTATIN CALCIUM 20 MG PO TABS
20.0000 mg | ORAL_TABLET | Freq: Every day | ORAL | 0 refills | Status: DC
  Filled 2020-12-09: qty 30, 30d supply, fill #0

## 2020-12-09 MED ORDER — CARVEDILOL 25 MG PO TABS
25.0000 mg | ORAL_TABLET | Freq: Two times a day (BID) | ORAL | 0 refills | Status: DC
Start: 2020-12-09 — End: 2021-01-08
  Filled 2020-12-09: qty 60, 30d supply, fill #0

## 2020-12-09 NOTE — Progress Notes (Deleted)
Cardiology Office Note    Date:  12/09/2020   ID:  David [name], DOB March 03, 1961, MRN [id number]   PCP:  David Rigg, NP    [facility]  Cardiologist:  Charlton Haws, MD *** Advanced Practice Provider:  No care team member to display Electrophysiologist:  None   [phone]   No chief complaint on file.   History of Present Illness:  David [name] is a 60 y.o. male with history of persistent atrial fibrillation, failed DCCV 09/2017 attempted sotalol but patient stopped meds, heart failure with reduced EF 35 to 40% 10/17/2018 probably tachycardia mediated versus hypertensive.  Myoview 2019 mild anterior lateral ischemia.  History of hypertension suspected OSA, DM.  Patient was admitted 10/2018 with A. fib with RVR and low EF plan at that time with rate control and repeat echo in 3 months but he did not return to clinic.  Patient most recently saw David Newcomer, PA-C 06/09/2020 and had run out of Eliquis while waiting for assistance.  Rate was well controlled.  He was to follow-up in our hypertension clinic in 2 weeks but never did.  Follow-up labs not done either.    Past Medical History:  Diagnosis Date   Cardiomyopathy (HCC)    a. EF 45% in 2019.   Chronic anticoagulation 09/30/2017   Diabetes mellitus type 2 in nonobese [facility])    Does not have health insurance    Essential hypertension 09/26/2017   Financial difficulties    H/O noncompliance with medical treatment, presenting hazards to health    Non-insulin treated type 2 diabetes mellitus (HCC) 09/26/2017   Persistent atrial fibrillation (HCC)    Sleep apnea suspected 09/30/2017    Past Surgical History:  Procedure Laterality Date   APPENDECTOMY  1971   CARDIOVERSION N/A 09/28/2017   Procedure: CARDIOVERSION;  Surgeon: Thurmon Fair, MD;  [facility] END[facility];  Service: Cardiovascular;  Laterality: N/A;   TEE WITHOUT CARDIOVERSION N/A 09/28/2017   Procedure: TRANSESOPHAGEAL ECHOCARDIOGRAM (TEE);   Surgeon: Thurmon Fair, MD;  Location: [facility] ENDOSCOPY;  Service: Cardiovascular;  Laterality: N/A;    Current Medications: No outpatient medications have been marked as taking for the 12/22/20 encounter (Appointment) with Dyann Kief, PA-C.     Allergies:   Patient has no known allergies.   Social History   Socioeconomic History   Marital status: Married    Spouse name: Not on file   Number of children: Not on file   Years of education: Not on file   Highest education level: Not on file  Occupational History   Not on file  Tobacco Use   Smoking status: Former    Years: 15.00    Types: Cigarettes    Quit date: 03/18/2018    Years since quitting: 2.7   Smokeless tobacco: Never   Tobacco comments:    09/26/2017 "2-3 cigarettes/month now"  Vaping Use   Vaping Use: Never used  Substance and Sexual Activity   Alcohol use: Yes    Alcohol/week: 3.0 standard drinks    Types: 3 Cans of beer per week   Drug use: Never   Sexual activity: Not Currently  Other Topics Concern   Not on file  Social History Narrative   Not on file   Social Determinants of Health   Financial Resource Strain: Not on file  Food Insecurity: Not on file  Transportation Needs: Not on file  Physical Activity: Not on file  Stress: Not on file  Social Connections: Not on file  Family History:  The patient's ***family history includes Hypertension in his mother.   ROS:   Please see the history of present illness.    ROS All other systems reviewed and are negative.   PHYSICAL EXAM:   VS:  There were no vitals taken for this visit.  Physical Exam  GEN: Well nourished, well developed, in no acute distress  HEENT: normal  Neck: no JVD, carotid bruits, or masses Cardiac:RRR; no murmurs, rubs, or gallops  Respiratory:  clear to auscultation bilaterally, normal work of breathing GI: soft, nontender, nondistended, + BS Ext: without cyanosis, clubbing, or edema, Good distal pulses  bilaterally MS: no deformity or atrophy  Skin: warm and dry, no rash Neuro:  Alert and Oriented x 3, Strength and sensation are intact Psych: euthymic mood, full affect  Wt Readings from Last 3 Encounters:  06/09/20 191 lb (86.6 kg)  09/25/19 191 lb (86.6 kg)  08/26/19 180 lb (81.6 kg)      Studies/Labs Reviewed:   EKG:  EKG is*** ordered today.  The ekg ordered today demonstrates ***  Recent Labs: No results found for requested labs within last 8760 hours.   Lipid Panel    Component Value Date/Time   CHOL 215 (H) 10/18/2018 0740   TRIG 103 10/18/2018 0740   HDL 33 (L) 10/18/2018 0740   CHOLHDL 6.5 10/18/2018 0740   VLDL 21 10/18/2018 0740   LDLCALC 161 (H) 10/18/2018 0740    Additional studies/ records that were reviewed today include:  2D echo 10/17/2018 IMPRESSIONS     1. The left ventricle has moderately reduced systolic function, with an  ejection fraction of 35-40%. The cavity size was normal. There is moderate  concentric left ventricular hypertrophy. Left ventricular diastolic  function could not be evaluated  secondary to atrial fibrillation.   2. The right ventricle has moderately reduced systolic function. The  cavity was normal. There is no increase in right ventricular wall  thickness. Right ventricular systolic pressure is mildly elevated.   3. Mitral valve regurgitation is mild to moderate by color flow Doppler.   4. The tricuspid valve is grossly normal.   5. No stenosis of the aortic valve.   6. The inferior vena cava was dilated in size with <50% respiratory  variability.   7. The interatrial septum was not well visualized.   NST 09/29/2017 IMPRESSION: 1. Mild reversibility along the anterolateral wall in the mid segment is an equivocal finding. No other areas are concerning for reversible ischemia.   2. Mild diffuse hypokinesia throughout the left ventricle.   3. Left ventricular ejection fraction is 44%.   4. Non invasive risk  stratification*: Intermediate   *2012 Appropriate Use Criteria for Coronary Revascularization Focused Update: J Am Coll Cardiol. 2012;59(9):857-881. http://content.dementiazones.com.aspx?articleid=1201161     Electronically Signed   By: Richarda Overlie M.D.   On: 09/29/2017 12:04    Risk Assessment/Calculations:   {Does this patient have ATRIAL FIBRILLATION?[phone]}     ASSESSMENT:    1. Persistent atrial fibrillation (HCC)   2. HFrEF (heart failure with reduced ejection fraction) (HCC)   3. Essential hypertension   4. Insulin dependent type 2 diabetes mellitus (HCC)      PLAN:  In order of problems listed above:  Persistent atrial fibrillation on carvedilol digoxin and apixaban  HFrEF ejection fraction 35 to 40% on echo in 2020 on Entresto carvedilol spironolactone and digoxin.  Plan was to repeat 2D echo but has not been done  Essential hypertension  Insulin-dependent  type 2 diabetes mellitus followed by primary care  Shared Decision Making/Informed Consent   {Are you ordering a CV Procedure (e.g. stress test, cath, DCCV, TEE, etc)?   Press F2        :[id number]}    Medication Adjustments/Labs and Tests Ordered: Current medicines are reviewed at length with the patient today.  Concerns regarding medicines are outlined above.  Medication changes, Labs and Tests ordered today are listed in the Patient Instructions below. There are no Patient Instructions on file for this visit.   Elson Clan, PA-C  12/09/2020 9:00 AM    [facility] [address] [place], [place], [place]  [address] Phone: [phone]; Fax: [phone]

## 2020-12-09 NOTE — Telephone Encounter (Signed)
Pt. Has appointment 01/08/21.

## 2020-12-09 NOTE — Telephone Encounter (Signed)
Called patient and tried to schedule a virtual visit but patient said that he needs to be seen in person. Patient has an appointment on 10/7 and would like medication until his appointment. Please f/u    Copied from CRM [id number]. Topic: General - Other >> Dec 08, 2020  2:45 PM David [name] A wrote: Reason for CRM: Patient would like to be contacted by a member of clinical staff regarding a previously denied refill request  The patient has scheduled a follow up for 01/08/21  Please contact further when possible

## 2020-12-22 ENCOUNTER — Ambulatory Visit: Payer: Self-pay | Admitting: Physician Assistant

## 2020-12-22 DIAGNOSIS — E119 Type 2 diabetes mellitus without complications: Secondary | ICD-10-CM

## 2020-12-22 DIAGNOSIS — I10 Essential (primary) hypertension: Secondary | ICD-10-CM

## 2020-12-22 DIAGNOSIS — I5020 Unspecified systolic (congestive) heart failure: Secondary | ICD-10-CM

## 2020-12-22 DIAGNOSIS — I4819 Other persistent atrial fibrillation: Secondary | ICD-10-CM

## 2020-12-25 ENCOUNTER — Other Ambulatory Visit: Payer: Self-pay

## 2020-12-28 ENCOUNTER — Other Ambulatory Visit: Payer: Self-pay | Admitting: Nurse Practitioner

## 2020-12-28 ENCOUNTER — Other Ambulatory Visit: Payer: Self-pay

## 2020-12-28 DIAGNOSIS — I10 Essential (primary) hypertension: Secondary | ICD-10-CM

## 2020-12-28 MED ORDER — ENTRESTO 24-26 MG PO TABS
1.0000 | ORAL_TABLET | Freq: Two times a day (BID) | ORAL | 0 refills | Status: DC
  Filled 2020-12-28: qty 60, 30d supply, fill #0

## 2020-12-28 NOTE — Telephone Encounter (Signed)
Requested medication (s) are due for refill today: yes  Requested medication (s) are on the active medication list: yes  Last refill:  10/23/20 #60  Future visit scheduled: yes  Notes to clinic:  med not assigned to a protocol   Requested Prescriptions  Pending Prescriptions Disp Refills   sacubitril-valsartan (ENTRESTO) 24-26 MG 60 tablet 0    Sig: Take 1 tablet by mouth 2 (two) times daily.     Off-Protocol Failed - 12/28/2020  8:22 AM      Failed - Medication not assigned to a protocol, review manually.      Passed - Valid encounter within last 12 months    Recent Outpatient Visits           6 months ago Essential hypertension   [facility] [place], [place] W, NP   1 year ago Diabetes mellitus type 2, uncontrolled, with complications [facility])   [place] [facility] [place], [place], [place]   2 years ago Diabetes mellitus type 2, uncontrolled, with complications [facility])   [place] [facility] [place], Shea Stakes, NP       Future Appointments             In 1 week Claiborne Rigg, NP [employer] [facility]   In 2 weeks Dan Humphreys, Storm Frisk, NP [facility], [facility]

## 2020-12-29 ENCOUNTER — Other Ambulatory Visit: Payer: Self-pay

## 2021-01-05 ENCOUNTER — Other Ambulatory Visit: Payer: Self-pay

## 2021-01-08 ENCOUNTER — Other Ambulatory Visit: Payer: Self-pay

## 2021-01-08 ENCOUNTER — Ambulatory Visit: Payer: Self-pay | Attending: Nurse Practitioner | Admitting: Nurse Practitioner

## 2021-01-08 VITALS — BP 128/83 | HR 108 | Ht 72.0 in | Wt 176.0 lb

## 2021-01-08 DIAGNOSIS — E785 Hyperlipidemia, unspecified: Secondary | ICD-10-CM

## 2021-01-08 DIAGNOSIS — I429 Cardiomyopathy, unspecified: Secondary | ICD-10-CM

## 2021-01-08 DIAGNOSIS — E1142 Type 2 diabetes mellitus with diabetic polyneuropathy: Secondary | ICD-10-CM

## 2021-01-08 DIAGNOSIS — R0981 Nasal congestion: Secondary | ICD-10-CM

## 2021-01-08 DIAGNOSIS — E1165 Type 2 diabetes mellitus with hyperglycemia: Secondary | ICD-10-CM

## 2021-01-08 DIAGNOSIS — Z7901 Long term (current) use of anticoagulants: Secondary | ICD-10-CM

## 2021-01-08 DIAGNOSIS — F419 Anxiety disorder, unspecified: Secondary | ICD-10-CM

## 2021-01-08 DIAGNOSIS — I480 Paroxysmal atrial fibrillation: Secondary | ICD-10-CM

## 2021-01-08 DIAGNOSIS — Z794 Long term (current) use of insulin: Secondary | ICD-10-CM

## 2021-01-08 DIAGNOSIS — Z1211 Encounter for screening for malignant neoplasm of colon: Secondary | ICD-10-CM

## 2021-01-08 DIAGNOSIS — I10 Essential (primary) hypertension: Secondary | ICD-10-CM

## 2021-01-08 LAB — POCT GLYCOSYLATED HEMOGLOBIN (HGB A1C): Hemoglobin A1C: 11.3 % — AB (ref 4.0–5.6)

## 2021-01-08 LAB — GLUCOSE, POCT (MANUAL RESULT ENTRY): POC Glucose: 478 mg/dl — AB (ref 70–99)

## 2021-01-08 MED ORDER — APIXABAN 5 MG PO TABS
5.0000 mg | ORAL_TABLET | Freq: Two times a day (BID) | ORAL | 0 refills | Status: DC
Start: 2021-01-08 — End: 2021-05-07
  Filled 2021-01-08: qty 60, 30d supply, fill #0
  Filled 2021-02-24: qty 60, 30d supply, fill #1
  Filled 2021-04-09: qty 60, 30d supply, fill #2
  Filled 2021-04-09: qty 60, 30d supply, fill #0

## 2021-01-08 MED ORDER — GABAPENTIN 300 MG PO CAPS
300.0000 mg | ORAL_CAPSULE | Freq: Three times a day (TID) | ORAL | 3 refills | Status: DC
Start: 2021-01-08 — End: 2021-06-16
  Filled 2021-01-08: qty 90, 30d supply, fill #0
  Filled 2021-02-24: qty 90, 30d supply, fill #1
  Filled 2021-04-09: qty 90, 30d supply, fill #0
  Filled 2021-04-09: qty 90, 30d supply, fill #2
  Filled 2021-04-10: qty 90, 30d supply, fill #0

## 2021-01-08 MED ORDER — LEVEMIR FLEXTOUCH 100 UNIT/ML ~~LOC~~ SOPN
20.0000 [IU] | PEN_INJECTOR | Freq: Every day | SUBCUTANEOUS | 6 refills | Status: DC
  Filled 2021-01-08: qty 6, 30d supply, fill #0
  Filled 2021-02-24: qty 6, 30d supply, fill #1
  Filled 2021-05-24: qty 6, 30d supply, fill #2
  Filled 2021-05-25: qty 6, 30d supply, fill #0
  Filled 2021-07-10 – 2021-08-02 (×2): qty 6, 30d supply, fill #1

## 2021-01-08 MED ORDER — TRULICITY 0.75 MG/0.5ML ~~LOC~~ SOAJ
0.7500 mg | SUBCUTANEOUS | 0 refills | Status: DC
  Filled 2021-01-08: qty 2, 28d supply, fill #0

## 2021-01-08 MED ORDER — ENTRESTO 24-26 MG PO TABS
1.0000 | ORAL_TABLET | Freq: Two times a day (BID) | ORAL | 3 refills | Status: AC
Start: 2021-01-08 — End: 2021-02-07
  Filled 2021-01-08 – 2021-05-07 (×2): qty 60, 30d supply, fill #0

## 2021-01-08 MED ORDER — FLUTICASONE PROPIONATE 50 MCG/ACT NA SUSP
2.0000 | Freq: Every day | NASAL | 6 refills | Status: DC
  Filled 2021-01-08: qty 16, 30d supply, fill #0
  Filled 2021-02-24: qty 16, 30d supply, fill #1
  Filled 2021-05-07: qty 16, 30d supply, fill #0
  Filled 2021-08-02: qty 16, 30d supply, fill #1
  Filled 2021-09-21: qty 16, 30d supply, fill #2

## 2021-01-08 MED ORDER — DIGOXIN 125 MCG PO TABS
125.0000 ug | ORAL_TABLET | Freq: Every day | ORAL | 0 refills | Status: DC
  Filled 2021-01-08: qty 30, 30d supply, fill #0
  Filled 2021-02-24: qty 30, 30d supply, fill #1
  Filled 2021-04-09: qty 30, 30d supply, fill #0
  Filled 2021-04-09: qty 30, 30d supply, fill #2

## 2021-01-08 MED ORDER — FUROSEMIDE 40 MG PO TABS
40.0000 mg | ORAL_TABLET | Freq: Every day | ORAL | 3 refills | Status: DC
  Filled 2021-01-08: qty 30, 30d supply, fill #0
  Filled 2021-02-24: qty 30, 30d supply, fill #1
  Filled 2021-05-07: qty 30, 30d supply, fill #0
  Filled 2021-06-11: qty 30, 30d supply, fill #1

## 2021-01-08 MED ORDER — CARVEDILOL 25 MG PO TABS
25.0000 mg | ORAL_TABLET | Freq: Two times a day (BID) | ORAL | 0 refills | Status: DC
  Filled 2021-01-08: qty 60, 30d supply, fill #0

## 2021-01-08 MED ORDER — TRUEPLUS LANCETS 28G MISC
3 refills | Status: DC
  Filled 2021-01-08: qty 100, 33d supply, fill #0

## 2021-01-08 MED ORDER — SPIRONOLACTONE 25 MG PO TABS
12.5000 mg | ORAL_TABLET | Freq: Every day | ORAL | 1 refills | Status: DC
  Filled 2021-01-08: qty 15, 30d supply, fill #0
  Filled 2021-02-24: qty 15, 30d supply, fill #1
  Filled 2021-04-09: qty 15, 30d supply, fill #0
  Filled 2021-04-09: qty 15, 30d supply, fill #2
  Filled 2021-05-07: qty 15, 30d supply, fill #1
  Filled 2021-06-11: qty 15, 30d supply, fill #2

## 2021-01-08 MED ORDER — ATORVASTATIN CALCIUM 20 MG PO TABS
20.0000 mg | ORAL_TABLET | Freq: Every day | ORAL | 1 refills | Status: DC
  Filled 2021-01-08: qty 30, 30d supply, fill #0

## 2021-01-08 MED ORDER — INSULIN ASPART 100 UNIT/ML FLEXPEN
6.0000 [IU] | PEN_INJECTOR | Freq: Four times a day (QID) | SUBCUTANEOUS | 6 refills | Status: DC
  Filled 2021-01-08: qty 6, 33d supply, fill #0
  Filled 2021-02-24: qty 6, 33d supply, fill #1
  Filled 2021-05-24: qty 6, 33d supply, fill #2
  Filled 2021-05-25: qty 6, 25d supply, fill #0
  Filled 2021-07-10 – 2021-08-02 (×2): qty 6, 25d supply, fill #1

## 2021-01-08 MED ORDER — HYDROXYZINE HCL 10 MG PO TABS
10.0000 mg | ORAL_TABLET | Freq: Three times a day (TID) | ORAL | 1 refills | Status: DC | PRN
Start: 2021-01-08 — End: 2021-06-16
  Filled 2021-01-08: qty 60, 20d supply, fill #0
  Filled 2021-02-24: qty 60, 20d supply, fill #1

## 2021-01-08 NOTE — Progress Notes (Signed)
Assessment & Plan:  David [name] was seen today for diabetes.  Diagnoses and all orders for this visit:  Type 2 diabetes mellitus with hyperglycemia, with long-term current use of insulin (HCC) -     POCT glucose (manual entry) -     POCT glycosylated hemoglobin (Hb A1C) -     Microalbumin / creatinine urine ratio -     gabapentin (NEURONTIN) 300 MG capsule; Take 1 capsule (300 mg total) by mouth 3 (three) times daily. -     insulin detemir (LEVEMIR FLEXTOUCH) 100 UNIT/ML FlexPen; Inject 20 Units into the skin at bedtime. NEEDS PASS -     insulin aspart (NOVOLOG) 100 UNIT/ML FlexPen; Inject 6 Units into the skin in the morning, at noon, in the evening, and at bedtime. -     TRUEplus Lancets 28G MISC; Use as instructed. Check blood glucose level by fingerstick 3 times per day. -     Dulaglutide (TRULICITY) 0.75 MG/0.5ML SOPN; Inject 0.75 mg into the skin once a week. -     CMP14+EGFR Continue blood sugar control as discussed in office today, low carbohydrate diet, and regular physical exercise as tolerated, 150 minutes per week (30 min each day, 5 days per week, or 50 min 3 days per week). Keep blood sugar logs with fasting goal of 90-130 mg/dl, post prandial (after you eat) less than 180.  For Hypoglycemia: BS <60 and Hyperglycemia BS >400; contact the clinic ASAP. Annual eye exams and foot exams are recommended.   Nasal congestion -     fluticasone (FLONASE) 50 MCG/ACT nasal spray; Place 2 sprays into both nostrils daily. -     CBC  Cardiomyopathy, unspecified type (HCC) -     digoxin (LANOXIN) 0.125 MG tablet; Take 1 tablet (125 mcg total) by mouth daily. -     spironolactone (ALDACTONE) 25 MG tablet; Take 0.5 tablets (12.5 mg total) by mouth daily. -     furosemide (LASIX) 40 MG tablet; Take 1 tablet (40 mg total) by mouth daily. -     Digoxin level  Essential hypertension -     spironolactone (ALDACTONE) 25 MG tablet; Take 0.5 tablets (12.5 mg total) by mouth daily. -     carvedilol  (COREG) 25 MG tablet; Take 1 tablet (25 mg total) by mouth 2 (two) times daily. -     sacubitril-valsartan (ENTRESTO) 24-26 MG; Take 1 tablet by mouth 2 (two) times daily. Continue all antihypertensives as prescribed.  Remember to bring in your blood pressure log with you for your follow up appointment.  DASH/Mediterranean Diets are healthier choices for HTN.    Dyslipidemia, goal LDL below 70 -     atorvastatin (LIPITOR) 20 MG tablet; Take 1 tablet (20 mg total) by mouth daily. -     Lipid panel INSTRUCTIONS: Work on a low fat, heart healthy diet and participate in regular aerobic exercise program by working out at least 150 minutes per week; 5 days a week-30 minutes per day. Avoid red meat/beef/steak,  fried foods. junk foods, sodas, sugary drinks, unhealthy snacking, alcohol and smoking.  Drink at least 80 oz of water per day and monitor your carbohydrate intake daily.    Colon cancer screening -     Fecal occult blood, imunochemical([facility]/Sun[facility])  Hyperbilirubinemia -     CBC  Chronic anticoagulation -     apixaban (ELIQUIS) 5 MG TABS tablet; Take 1 tablet (5 mg total) by mouth 2 (two) times daily. -     CBC  Diabetic peripheral neuropathy associated with type 2 diabetes mellitus (HCC) -     gabapentin (NEURONTIN) 300 MG capsule; Take 1 capsule (300 mg total) by mouth 3 (three) times daily.  AF (paroxysmal atrial fibrillation) (HCC) -     digoxin (LANOXIN) 0.125 MG tablet; Take 1 tablet (125 mcg total) by mouth daily. -     apixaban (ELIQUIS) 5 MG TABS tablet; Take 1 tablet (5 mg total) by mouth 2 (two) times daily.  Anxiety -     hydrOXYzine (ATARAX/VISTARIL) 10 MG tablet; Take 1 tablet (10 mg total) by mouth 3 (three) times daily as needed for anxiety.   Patient has been counseled on age-appropriate routine health concerns for screening and prevention. These are reviewed and up-to-date. Referrals have been placed accordingly. Immunizations are up-to-date or declined.     Subjective:   Chief Complaint  Patient presents with   Diabetes   HPI David [name] 60 y.o. male presents to office today for follow up to DM and HTN. He has a past medical history of Cardiomyopathy, Chronic anticoagulation (09/30/2017), Diabetes mellitus type 2 in nonobese,  Essential hypertension (09/26/2017), H/O noncompliance with medical treatment, presenting hazards to health, Persistent atrial fibrillation, and Sleep apnea suspected (09/30/2017).   He follows with Cardiology for persistent afib, HF with reduced EF Has missed several appointment with me and Cardiology this year   DM 2 Poorly controlled due to dietary and medication non adherence. Dinner last night: Fried flounder, cole slaw, hushpuppies Readings at home 150-180s in the mornings. He does have moments of lethargy and decreased energy. Lots of stress dealing with family and finances at this time. I am adding Trulicity 0.75 mg weekly today. He will continue to take novolog 6 units QID and levemir which I am increasing from 16 units to 20 units today.  Hyperglycemic symptoms include peripheral neuropathy for which we will start gabapentin today. LDL not at goal. Will increase atorvastatin to 40 mg daily. Lab Results  Component Value Date   HGBA1C 11.3 (A) 01/08/2021    Lab Results  Component Value Date   HGBA1C 9.7 (H) 08/26/2019    Lab Results  Component Value Date   LDLCALC 122 (H) 01/08/2021    Endorses chronic right nasal congestion and nasal polyps. Will try flonase and if no improvement will need to see ENT.   HTN Blood pressure is well controlled. He is currently prescribed entresto 24-26 mg BID, spironolactone 12.5 mg daily, furosemide 40 mg daily, carvedilol 25 mg BID. Denies chest pain, shortness of breath, palpitations, lightheadedness, dizziness, headaches or BLE edema.   BP Readings from Last 3 Encounters:  01/08/21 128/83  06/09/20 (!) 150/62  09/25/19 132/85     Anxiety Increased anxiety. He and  wife are separated. Also having to travel back and forth to assist daughter who is living in another state.      Review of Systems  Constitutional:  Negative for fever, malaise/fatigue and weight loss.  HENT:  Positive for congestion. Negative for nosebleeds.   Eyes: Negative.  Negative for blurred vision, double vision and photophobia.  Respiratory: Negative.  Negative for cough and shortness of breath.   Cardiovascular: Negative.  Negative for chest pain, palpitations and leg swelling.  Gastrointestinal: Negative.  Negative for heartburn, nausea and vomiting.  Musculoskeletal: Negative.  Negative for myalgias.  Neurological: Negative.  Negative for dizziness, focal weakness, seizures and headaches.  Psychiatric/Behavioral:  Negative for suicidal ideas. The patient is nervous/anxious.    Past Medical History:  Diagnosis Date   Cardiomyopathy ([place])    a. EF 45% in 2019.   Chronic anticoagulation 09/30/2017   Diabetes mellitus type 2 in nonobese [facility])    Does not have health insurance    Essential hypertension 09/26/2017   Financial difficulties    H/O noncompliance with medical treatment, presenting hazards to health    Non-insulin treated type 2 diabetes mellitus ([place]) 09/26/2017   Persistent atrial fibrillation ([place])    Sleep apnea suspected 09/30/2017    Past Surgical History:  Procedure Laterality Date   APPENDECTOMY  1971   CARDIOVERSION N/A 09/28/2017   Procedure: CARDIOVERSION;  Surgeon: Sanda Klein, MD;  Location: [place];  Service: Cardiovascular;  Laterality: N/A;   TEE WITHOUT CARDIOVERSION N/A 09/28/2017   Procedure: TRANSESOPHAGEAL ECHOCARDIOGRAM (TEE);  Surgeon: Sanda Klein, MD;  Location[facility] ENDOSCOPY;  Service: Cardiovascular;  Laterality: N/A;    Family History  Problem Relation Age of Onset   Hypertension Mother     Social History Reviewed with no changes to be made today.   Outpatient Medications Prior to Visit  Medication Sig Dispense Refill    Insulin Pen Needle 31G X 5 MM MISC USE AS INSTRUCTED. INJECT INTO THE SKIN 4 TIMES PER DAY 200 each 3   apixaban (ELIQUIS) 5 MG TABS tablet Take 1 tablet (5 mg total) by mouth 2 (two) times daily. 180 tablet 0   atorvastatin (LIPITOR) 20 MG tablet Take 1 tablet (20 mg total) by mouth daily. 30 tablet 0   carvedilol (COREG) 25 MG tablet Take 1 tablet (25 mg total) by mouth 2 (two) times daily. 60 tablet 0   furosemide (LASIX) 40 MG tablet Take 1 tablet (40 mg total) by mouth daily. 30 tablet 0   insulin aspart (NOVOLOG) 100 UNIT/ML FlexPen Inject 6 Units into the skin in the morning, at noon, in the evening, and at bedtime. 6 mL 0   insulin detemir (LEVEMIR FLEXTOUCH) 100 UNIT/ML FlexPen INJECT 16 UNITS INTO THE SKIN AT BEDTIME. 6 mL 2   Insulin Pen Needle (B-D UF III MINI PEN NEEDLES) 31G X 5 MM MISC Use as instructed. Inject into the skin 4 times per day 200 each 3   sacubitril-valsartan (ENTRESTO) 24-26 MG Take 1 tablet by mouth 2 (two) times daily. 60 tablet 0   TRUEplus Lancets 28G MISC Use as instructed. Check blood glucose level by fingerstick 3 times per day. 200 each 3   TRUEplus Lancets 28G MISC USE AS INSTRUCTED. CHECK BLOOD GLUCOSE LEVEL BY FINGERSTICK 3 TIMES PER DAY. 200 each 3   digoxin (LANOXIN) 0.125 MG tablet Take 1 tablet (125 mcg total) by mouth daily. 90 tablet 0   ibuprofen (ADVIL) 200 MG tablet Take 2 tablets (400 mg total) by mouth every 6 (six) hours as needed for mild pain. (Patient not taking: Reported on 01/08/2021) 30 tablet 0   sodium chloride (OCEAN) 0.65 % SOLN nasal spray Place 1 spray into both nostrils as needed for congestion. (Patient not taking: Reported on 01/08/2021)     spironolactone (ALDACTONE) 25 MG tablet Take 0.5 tablets (12.5 mg total) by mouth daily. 15 tablet 0   No facility-administered medications prior to visit.    No Known Allergies     Objective:    BP 128/83   Pulse (!) 108   Ht 6' (1.829 m)   Wt 176 lb (79.8 kg)   SpO2 95%   BMI  23.87 kg/m  Wt Readings from Last 3 Encounters:  01/08/21 176 lb (  79.8 kg)  06/09/20 191 lb (86.6 kg)  09/25/19 191 lb (86.6 kg)    Physical Exam Vitals and nursing note reviewed.  Constitutional:      Appearance: He is well-developed.  HENT:     Head: Normocephalic and atraumatic.  Cardiovascular:     Rate and Rhythm: Tachycardia present. Rhythm irregular.     Heart sounds: Normal heart sounds. No murmur heard.   No friction rub. No gallop.     Comments: NON COMPLIANT WITH MEDS Pulmonary:     Effort: Pulmonary effort is normal. No tachypnea or respiratory distress.     Breath sounds: Normal breath sounds. No decreased breath sounds, wheezing, rhonchi or rales.  Chest:     Chest wall: No tenderness.  Abdominal:     General: Bowel sounds are normal.     Palpations: Abdomen is soft.  Musculoskeletal:        General: Normal range of motion.     Cervical back: Normal range of motion.  Skin:    General: Skin is warm and dry.  Neurological:     Mental Status: He is alert and oriented to person, place, and time.     Coordination: Coordination normal.  Psychiatric:        Behavior: Behavior normal. Behavior is cooperative.        Thought Content: Thought content normal.        Judgment: Judgment normal.         Patient has been counseled extensively about nutrition and exercise as well as the importance of adherence with medications and regular follow-up. The patient was given clear instructions to go to ER or return to medical center if symptoms don't improve, worsen or new problems develop. The patient verbalized understanding.   Follow-up: Return in about 4 weeks (around 02/05/2021) for meter check with luke. See me in 3 months.   Gildardo Pounds, FNP-BC [facility] [place], [place]   01/10/2021, 8:14 PM

## 2021-01-10 ENCOUNTER — Encounter: Payer: Self-pay | Admitting: Nurse Practitioner

## 2021-01-10 LAB — CBC
Hematocrit: 49.7 % (ref 37.5–51.0)
Hemoglobin: 16.9 g/dL (ref 13.0–17.7)
MCH: 32.1 pg (ref 26.6–33.0)
MCHC: 34 g/dL (ref 31.5–35.7)
MCV: 95 fL (ref 79–97)
Platelets: 220 10*3/uL (ref 150–450)
RBC: 5.26 x10E6/uL (ref 4.14–5.80)
RDW: 11.8 % (ref 11.6–15.4)
WBC: 5.6 10*3/uL (ref 3.4–10.8)

## 2021-01-10 LAB — CMP14+EGFR
ALT: 17 IU/L (ref 0–44)
AST: 17 IU/L (ref 0–40)
Albumin/Globulin Ratio: 1.7 (ref 1.2–2.2)
Albumin: 4.1 g/dL (ref 3.8–4.9)
Alkaline Phosphatase: 153 IU/L — ABNORMAL HIGH (ref 44–121)
BUN/Creatinine Ratio: 16 (ref 9–20)
BUN: 21 mg/dL (ref 6–24)
Bilirubin Total: 1.4 mg/dL — ABNORMAL HIGH (ref 0.0–1.2)
CO2: 21 mmol/L (ref 20–29)
Calcium: 9.6 mg/dL (ref 8.7–10.2)
Chloride: 95 mmol/L — ABNORMAL LOW (ref 96–106)
Creatinine, Ser: 1.32 mg/dL — ABNORMAL HIGH (ref 0.76–1.27)
Globulin, Total: 2.4 g/dL (ref 1.5–4.5)
Glucose: 468 mg/dL — ABNORMAL HIGH (ref 70–99)
Potassium: 4.4 mmol/L (ref 3.5–5.2)
Sodium: 133 mmol/L — ABNORMAL LOW (ref 134–144)
Total Protein: 6.5 g/dL (ref 6.0–8.5)
eGFR: 62 mL/min/{1.73_m2} (ref >59)

## 2021-01-10 LAB — MICROALBUMIN / CREATININE URINE RATIO
Creatinine, Urine: 35.4 mg/dL
Microalb/Creat Ratio: 587 mg/g creat — ABNORMAL HIGH (ref 0–29)
Microalbumin, Urine: 207.8 ug/mL

## 2021-01-10 LAB — LIPID PANEL
Chol/HDL Ratio: 6.1 ratio — ABNORMAL HIGH (ref 0.0–5.0)
Cholesterol, Total: 233 mg/dL — ABNORMAL HIGH (ref 100–199)
HDL: 38 mg/dL — ABNORMAL LOW (ref >39)
LDL Chol Calc (NIH): 122 mg/dL — ABNORMAL HIGH (ref 0–99)
Triglycerides: 414 mg/dL — ABNORMAL HIGH (ref 0–149)
VLDL Cholesterol Cal: 73 mg/dL — ABNORMAL HIGH (ref 5–40)

## 2021-01-10 LAB — DIGOXIN LEVEL: Digoxin, Serum: <0.4 ng/mL — ABNORMAL LOW (ref 0.5–0.9)

## 2021-01-10 MED ORDER — ATORVASTATIN CALCIUM 40 MG PO TABS: 40.0000 mg | ORAL_TABLET | Freq: Every day | ORAL | 1 refills | Status: DC

## 2021-01-11 ENCOUNTER — Other Ambulatory Visit: Payer: Self-pay

## 2021-01-12 ENCOUNTER — Other Ambulatory Visit: Payer: Self-pay

## 2021-01-15 ENCOUNTER — Ambulatory Visit (HOSPITAL_BASED_OUTPATIENT_CLINIC_OR_DEPARTMENT_OTHER): Payer: Self-pay | Admitting: Family

## 2021-01-15 ENCOUNTER — Other Ambulatory Visit: Payer: Self-pay

## 2021-01-25 ENCOUNTER — Other Ambulatory Visit: Payer: Self-pay

## 2021-02-23 ENCOUNTER — Ambulatory Visit (HOSPITAL_BASED_OUTPATIENT_CLINIC_OR_DEPARTMENT_OTHER): Payer: Self-pay | Admitting: Family

## 2021-02-23 ENCOUNTER — Encounter (HOSPITAL_BASED_OUTPATIENT_CLINIC_OR_DEPARTMENT_OTHER): Payer: Self-pay

## 2021-02-24 ENCOUNTER — Other Ambulatory Visit: Payer: Self-pay

## 2021-02-24 ENCOUNTER — Other Ambulatory Visit: Payer: Self-pay | Admitting: Nurse Practitioner

## 2021-02-24 DIAGNOSIS — E1165 Type 2 diabetes mellitus with hyperglycemia: Secondary | ICD-10-CM

## 2021-02-24 DIAGNOSIS — I10 Essential (primary) hypertension: Secondary | ICD-10-CM

## 2021-02-24 MED ORDER — CARVEDILOL 25 MG PO TABS
25.0000 mg | ORAL_TABLET | Freq: Two times a day (BID) | ORAL | 0 refills | Status: DC
  Filled 2021-02-24: qty 60, 30d supply, fill #0

## 2021-02-24 MED ORDER — TRULICITY 0.75 MG/0.5ML ~~LOC~~ SOAJ
0.7500 mg | SUBCUTANEOUS | 0 refills | Status: DC
  Filled 2021-02-24: qty 2, 28d supply, fill #0

## 2021-03-01 ENCOUNTER — Other Ambulatory Visit: Payer: Self-pay

## 2021-03-02 ENCOUNTER — Other Ambulatory Visit: Payer: Self-pay

## 2021-03-17 ENCOUNTER — Other Ambulatory Visit: Payer: Self-pay

## 2021-03-25 ENCOUNTER — Other Ambulatory Visit: Payer: Self-pay | Admitting: Nurse Practitioner

## 2021-03-25 DIAGNOSIS — I10 Essential (primary) hypertension: Secondary | ICD-10-CM

## 2021-03-25 DIAGNOSIS — Z794 Long term (current) use of insulin: Secondary | ICD-10-CM

## 2021-03-25 MED ORDER — CARVEDILOL 25 MG PO TABS
25.0000 mg | ORAL_TABLET | Freq: Two times a day (BID) | ORAL | 0 refills | Status: DC
  Filled 2021-04-09 – 2021-04-10 (×3): qty 60, 30d supply, fill #0

## 2021-03-25 MED ORDER — TRULICITY 0.75 MG/0.5ML ~~LOC~~ SOAJ
0.7500 mg | SUBCUTANEOUS | 0 refills | Status: AC
  Filled 2021-04-09 – 2021-04-10 (×3): qty 2, 28d supply, fill #0

## 2021-03-25 NOTE — Telephone Encounter (Signed)
Requested Prescriptions  Pending Prescriptions Disp Refills   carvedilol (COREG) 25 MG tablet 60 tablet 0    Sig: Take 1 tablet (25 mg total) by mouth 2 (two) times daily.     Cardiovascular:  Beta Blockers Passed - 03/25/2021  6:00 PM      Passed - Last BP in normal range    BP Readings from Last 1 Encounters:  01/08/21 128/83         Passed - Last Heart Rate in normal range    Pulse Readings from Last 1 Encounters:  01/08/21 (!) 108         Passed - Valid encounter within last 6 months    Recent Outpatient Visits          2 months ago Type 2 diabetes mellitus with hyperglycemia, with long-term current use of insulin (HCC)   [place] [facility] [place], Shea Stakes, NP   9 months ago Essential hypertension   [place] [address] [facility] [place], [place] W, NP   1 year ago Diabetes mellitus type 2, uncontrolled, with complications [facility])   [place] [facility] [place], [place], [place]   2 years ago Diabetes mellitus type 2, uncontrolled, with complications [facility])   [place] [facility] [place], [place] W, NP              Dulaglutide (TRULICITY) 0.75 MG/0.5ML SOPN 2 mL 0    Sig: Inject 0.75 mg into the skin once a week.     Endocrinology:  Diabetes - GLP-1 Receptor Agonists Failed - 03/25/2021  6:00 PM      Failed - HBA1C is between 0 and 7.9 and within 180 days    Hemoglobin A1C  Date Value Ref Range Status  01/08/2021 11.3 (A) 4.0 - 5.6 % Final   Hgb A1c MFr Bld  Date Value Ref Range Status  08/26/2019 9.7 (H) 4.8 - 5.6 % Final    Comment:    (NOTE) Pre diabetes:          5.7%-6.4% Diabetes:              >6.4% Glycemic control for   <7.0% adults with diabetes          Passed - Valid encounter within last 6 months    Recent Outpatient Visits          2 months ago Type 2 diabetes mellitus with hyperglycemia, with long-term current use of insulin [facility])   [place] [facility]  [facility] [place], Shea Stakes, NP   9 months ago Essential hypertension   [place] [address] [facility] [place], Shea Stakes, NP   1 year ago Diabetes mellitus type 2, uncontrolled, with complications [facility])   [place] [facility] [place], [place], [place]   2 years ago Diabetes mellitus type 2, uncontrolled, with complications [facility])   [place] [facility] [place], Shea Stakes, NP

## 2021-04-02 ENCOUNTER — Other Ambulatory Visit: Payer: Self-pay

## 2021-04-09 ENCOUNTER — Other Ambulatory Visit: Payer: Self-pay

## 2021-04-09 MED FILL — Insulin Pen Needle 31 G X 5 MM (1/5" or 3/16"): 25 days supply | Qty: 100 | Fill #1 | Status: CN

## 2021-04-09 MED FILL — Insulin Pen Needle 31 G X 5 MM (1/5" or 3/16"): 25 days supply | Qty: 100 | Fill #0 | Status: AC

## 2021-04-10 ENCOUNTER — Other Ambulatory Visit: Payer: Self-pay

## 2021-04-12 ENCOUNTER — Other Ambulatory Visit: Payer: Self-pay

## 2021-04-13 ENCOUNTER — Other Ambulatory Visit: Payer: Self-pay

## 2021-05-07 ENCOUNTER — Other Ambulatory Visit: Payer: Self-pay

## 2021-05-07 ENCOUNTER — Other Ambulatory Visit: Payer: Self-pay | Admitting: Nurse Practitioner

## 2021-05-07 DIAGNOSIS — Z7901 Long term (current) use of anticoagulants: Secondary | ICD-10-CM

## 2021-05-07 DIAGNOSIS — I429 Cardiomyopathy, unspecified: Secondary | ICD-10-CM

## 2021-05-07 DIAGNOSIS — I10 Essential (primary) hypertension: Secondary | ICD-10-CM

## 2021-05-07 DIAGNOSIS — I480 Paroxysmal atrial fibrillation: Secondary | ICD-10-CM

## 2021-05-07 MED ORDER — DIGOXIN 125 MCG PO TABS
125.0000 ug | ORAL_TABLET | Freq: Every day | ORAL | 0 refills | Status: DC
  Filled 2021-05-07 – 2021-05-12 (×2): qty 30, 30d supply, fill #0

## 2021-05-07 MED ORDER — CARVEDILOL 25 MG PO TABS
25.0000 mg | ORAL_TABLET | Freq: Two times a day (BID) | ORAL | 0 refills | Status: DC
  Filled 2021-05-07 – 2021-05-12 (×2): qty 60, 30d supply, fill #0

## 2021-05-07 MED ORDER — APIXABAN 5 MG PO TABS
5.0000 mg | ORAL_TABLET | Freq: Two times a day (BID) | ORAL | 0 refills | Status: DC
  Filled 2021-05-07: qty 60, 30d supply, fill #0

## 2021-05-10 ENCOUNTER — Other Ambulatory Visit: Payer: Self-pay | Admitting: *Deleted

## 2021-05-10 DIAGNOSIS — Z7901 Long term (current) use of anticoagulants: Secondary | ICD-10-CM

## 2021-05-10 DIAGNOSIS — I480 Paroxysmal atrial fibrillation: Secondary | ICD-10-CM

## 2021-05-10 MED ORDER — APIXABAN 5 MG PO TABS: 5.0000 mg | ORAL_TABLET | Freq: Two times a day (BID) | ORAL | 1 refills | Status: DC

## 2021-05-10 NOTE — Telephone Encounter (Signed)
Eliquis 5mg  paper refill request received. Patient is 61 years old, weight-79.8kg, Crea-1.32 on 01/08/2021, Diagnosis-Afib, and last seen by 03/10/2021 on 06/09/2020 & pending appt on 06/16/2021 with 06/18/2021. Dose is appropriate based on dosing criteria. Will send in refill to requested pharmacy.   Last sent on 05/07/2021 by another practice to [facility] for 60 tabs and 1 refill. Pt is on med for Afib, refill sent per paper request.

## 2021-05-11 ENCOUNTER — Other Ambulatory Visit: Payer: Self-pay

## 2021-05-12 ENCOUNTER — Other Ambulatory Visit: Payer: Self-pay

## 2021-05-25 ENCOUNTER — Other Ambulatory Visit: Payer: Self-pay

## 2021-06-11 ENCOUNTER — Other Ambulatory Visit: Payer: Self-pay

## 2021-06-11 ENCOUNTER — Other Ambulatory Visit: Payer: Self-pay | Admitting: Nurse Practitioner

## 2021-06-11 ENCOUNTER — Other Ambulatory Visit: Payer: Self-pay | Admitting: Family Medicine

## 2021-06-11 DIAGNOSIS — E785 Hyperlipidemia, unspecified: Secondary | ICD-10-CM

## 2021-06-11 DIAGNOSIS — I10 Essential (primary) hypertension: Secondary | ICD-10-CM

## 2021-06-11 DIAGNOSIS — I429 Cardiomyopathy, unspecified: Secondary | ICD-10-CM

## 2021-06-11 DIAGNOSIS — I480 Paroxysmal atrial fibrillation: Secondary | ICD-10-CM

## 2021-06-11 NOTE — Telephone Encounter (Signed)
Requested medication (s) are due for refill today: yes ? ?Requested medication (s) are on the active medication list: yes ? ?Last refill:  digoxin- 05/07/21-08/05/21 #30 0 refills , coreg- 05/07/21 - 06/11/21 #60 0 refills ? ?Future visit scheduled: yes in 6 days  ? ?Notes to clinic:  last labs 01/08/21 do you want to refill Rx? ? ? ?  ?Requested Prescriptions  ?Pending Prescriptions Disp Refills  ? digoxin (LANOXIN) 0.125 MG tablet 30 tablet 0  ?  Sig: Take 1 tablet (125 mcg total) by mouth daily.  ?  ? Cardiovascular:  Antiarrhythmic Agents - digoxin Failed - 06/11/2021  3:46 PM  ?  ?  Failed - Cr in normal range and within 180 days  ?  Creatinine, Ser  ?Date Value Ref Range Status  ?01/08/2021 1.32 (H) 0.76 - 1.27 mg/dL Final  ?  ?  ?  ?  Failed - Digoxin (serum) in normal range and within 360 days  ?  No results found for: DIGOXIN  ?  ?  ?  Failed - Mg Level in normal range and within 360 days  ?  Magnesium  ?Date Value Ref Range Status  ?10/19/2018 1.8 1.7 - 2.4 mg/dL Final  ?  Comment:  ?  Performed at [facility], [address], [place], [place] [address]  ?  ?  ?  ?  Failed - Patient had ECG in the last 360 days  ?  ?  Passed - Ca in normal range and within 360 days  ?  Calcium  ?Date Value Ref Range Status  ?01/08/2021 9.6 8.7 - 10.2 mg/dL Final  ?  ?  ?  ?  Passed - K in normal range and within 180 days  ?  Potassium  ?Date Value Ref Range Status  ?01/08/2021 4.4 3.5 - 5.2 mmol/L Final  ?  ?  ?  ?  Passed - Last Heart Rate in normal range  ?  Pulse Readings from Last 1 Encounters:  ?01/08/21 (!) 108  ?  ?  ?  ?  Passed - Valid encounter within last 6 months  ?  Recent Outpatient Visits   ? ?      ? 5 months ago Type 2 diabetes mellitus with hyperglycemia, with long-term current use of insulin (HCC)  ? [facility] [place], Shea Stakes, NP  ? 1 year ago Essential hypertension  ? [facility] [place], [place] W, NP  ? 1 year ago Diabetes mellitus type 2,  uncontrolled, with complications (HCC)  ? [facility] [place], [place], [place]  ? 2 years ago Diabetes mellitus type 2, uncontrolled, with complications (HCC)  ? [facility] [place] Rigg, NP  ? ?  ?  ?Future Appointments   ? ?        ? In 5 days Beatrice Lecher, PA-C [facility] [address], [facility]  ? In 6 days [place], Marzella Schlein, PA-C [employer]  ? ?  ? ?  ?  ?  ? carvedilol (COREG) 25 MG tablet 60 tablet 0  ?  Sig: Take 1 tablet (25 mg total) by mouth 2 (two) times daily.  ?  ? Cardiovascular: Beta Blockers 3 Failed - 06/11/2021  3:46 PM  ?  ?  Failed - Cr in normal range and within 360 days  ?  Creatinine, Ser  ?Date Value  Ref Range Status  ?01/08/2021 1.32 (H) 0.76 - 1.27 mg/dL Final  ?  ?  ?  ?  Passed - AST in normal range and within 360 days  ?  AST  ?Date Value Ref Range Status  ?01/08/2021 17 0 - 40 IU/L Final  ?  ?  ?  ?  Passed - ALT in normal range and within 360 days  ?  ALT  ?Date Value Ref Range Status  ?01/08/2021 17 0 - 44 IU/L Final  ?  ?  ?  ?  Passed - Last BP in normal range  ?  BP Readings from Last 1 Encounters:  ?01/08/21 128/83  ?  ?  ?  ?  Passed - Last Heart Rate in normal range  ?  Pulse Readings from Last 1 Encounters:  ?01/08/21 (!) 108  ?  ?  ?  ?  Passed - Valid encounter within last 6 months  ?  Recent Outpatient Visits   ? ?      ? 5 months ago Type 2 diabetes mellitus with hyperglycemia, with long-term current use of insulin (HCC)  ? [facility] [place], Shea Stakes, NP  ? 1 year ago Essential hypertension  ? [facility] [place], [place], NP  ? 1 year ago Diabetes mellitus type 2, uncontrolled, with complications (HCC)  ? [facility] [place], [place], [place]  ? 2 years ago Diabetes mellitus type 2, uncontrolled, with complications (HCC)  ?  [facility] [place] Rigg, NP  ? ?  ?  ?Future Appointments   ? ?        ? In 5 days Beatrice Lecher, PA-C [facility] [address], [facility]  ? In 6 days [place], Marzella Schlein, PA-C [facility]  ? ?  ? ?  ?  ?  ? ?

## 2021-06-13 MED ORDER — ATORVASTATIN CALCIUM 40 MG PO TABS: 40.0000 mg | ORAL_TABLET | Freq: Every day | ORAL | 1 refills | Status: DC

## 2021-06-16 ENCOUNTER — Other Ambulatory Visit: Payer: Self-pay

## 2021-06-16 ENCOUNTER — Encounter: Payer: Self-pay | Admitting: Physician Assistant

## 2021-06-16 ENCOUNTER — Ambulatory Visit (INDEPENDENT_AMBULATORY_CARE_PROVIDER_SITE_OTHER): Payer: Self-pay | Admitting: Physician Assistant

## 2021-06-16 VITALS — BP 110/80 | HR 90 | Ht 72.0 in | Wt 187.6 lb

## 2021-06-16 DIAGNOSIS — E7800 Pure hypercholesterolemia, unspecified: Secondary | ICD-10-CM

## 2021-06-16 DIAGNOSIS — R0602 Shortness of breath: Secondary | ICD-10-CM

## 2021-06-16 DIAGNOSIS — E119 Type 2 diabetes mellitus without complications: Secondary | ICD-10-CM

## 2021-06-16 DIAGNOSIS — I5022 Chronic systolic (congestive) heart failure: Secondary | ICD-10-CM

## 2021-06-16 DIAGNOSIS — I10 Essential (primary) hypertension: Secondary | ICD-10-CM

## 2021-06-16 DIAGNOSIS — I4819 Other persistent atrial fibrillation: Secondary | ICD-10-CM

## 2021-06-16 MED ORDER — DIGOXIN 125 MCG PO TABS
125.0000 ug | ORAL_TABLET | Freq: Every day | ORAL | 0 refills | Status: DC
  Filled 2021-06-16: qty 30, 30d supply, fill #0

## 2021-06-16 MED ORDER — CARVEDILOL 25 MG PO TABS
25.0000 mg | ORAL_TABLET | Freq: Two times a day (BID) | ORAL | 0 refills | Status: DC
  Filled 2021-06-16: qty 60, 30d supply, fill #0

## 2021-06-16 NOTE — Patient Instructions (Addendum)
Medication Instructions:  ?Your physician recommends that you continue on your current medications as directed. Please refer to the Current Medication list given to you today. ? ?*If you need a refill on your cardiac medications before your next appointment, please call your pharmacy* ? ? ?Lab Work: ?TODAY:  BMET, CBC, TSH, & PRO BNP ? ?If you have labs (blood work) drawn today and your tests are completely normal, you will receive your results only by: ?MyChart Message (if you have MyChart) OR ?A paper copy in the mail ?If you have any lab test that is abnormal or we need to change your treatment, we will call you to review the results. ? ? ?Testing/Procedures: ?Your physician has requested that you have an echocardiogram. Echocardiography is a painless test that uses sound waves to create images of your heart. It provides your doctor with information about the size and shape of your heart and how well your heart?s chambers and valves are working. This procedure takes approximately one hour. There are no restrictions for this procedure. ? ? ?Your physician has requested that you have a lexiscan myoview. For further information please visit [email or web address] Please follow instruction sheet, BELOW: ? ? ?You are scheduled for a Myocardial Perfusion Imaging Study  ?Please arrive 15 minutes prior to your appointment time for registration and insurance purposes. ? ?The test will take approximately 3 to 4 hours to complete; you may bring reading material.  If someone comes with you to your appointment, they will need to remain in the main lobby due to limited space in the testing area. **If you are pregnant or breastfeeding, please notify the nuclear lab prior to your appointment** ? ?How to prepare for your Myocardial Perfusion Test: ?Do not eat or drink 3 hours prior to your test, except you may have water. ?Do not consume products containing caffeine (regular or decaffeinated) 12 hours prior to your test. (ex:  coffee, chocolate, sodas, tea). ?Do bring a list of your current medications with you.  If not listed below, you may take your medications as normal. ?Do wear comfortable clothes (no dresses or overalls) and walking shoes, tennis shoes preferred (No heels or open toe shoes are allowed). ?Do NOT wear cologne, perfume, aftershave, or lotions (deodorant is allowed). ?If these instructions are not followed, your test will have to be rescheduled. ? ? ? ?Follow-Up: ?At [facility], you and your health needs are our priority.  As part of our continuing mission to provide you with exceptional heart care, we have created designated Provider Care Teams.  These Care Teams include your primary Cardiologist (physician) and Advanced Practice Providers (APPs -  Physician Assistants and Nurse Practitioners) who all work together to provide you with the care you need, when you need it. ? ?We recommend signing up for the patient portal called "MyChart".  Sign up information is provided on this After Visit Summary.  MyChart is used to connect with patients for Virtual Visits (Telemedicine).  Patients are able to view lab/test results, encounter notes, upcoming appointments, etc.  Non-urgent messages can be sent to your provider as well.   ?To learn more about what you can do with MyChart, go to [email or web address].   ? ?Your next appointment:   ?6-8 WEEKS ? ?The format for your next appointment:   ?In Person ? ?Provider:   ?Charlton Haws, MD  OR [address], PA-C ? ?Other Instructions ? ?

## 2021-06-16 NOTE — Assessment & Plan Note (Signed)
EF 35-40 by echocardiogram in 10/2018.  It has been suspected that he has a non-ischemic cardiomyopathy due to tachycardia from AFib.  He has had poor follow up and was last seen 12 months ago.  He had a Myoview in 2019 with ? Ant-lat ischemia.  It was felt that he needed rate control first then pursue further ischemic workup.  He was supposed to get an echocardiogram to reassess LVF after his last visit but this was never done.  He has poor energy and feels lethargic.  He has some shortness of breath with certain activities and describes NYHA II symptoms.  His exam does not suggest volume excess.  His diabetes is poorly controlled ( 01/08/2021: Hemoglobin A1C 11.3).  His symptoms may be from poorly controlled diabetes mellitus, congestive heart failure.  He is at risk for coronary artery disease given his uncontrolled diabetes.  Question if his fatigue is an unusual presentation for angina.  As far as GDMT goes, I am hesitant to place him on SLGT2i in the setting of poorly controlled diabetes as it will result in massive glucosuria and put him at risk for GU infection. ?? Continue Coreg 25 mg twice daily, digoxin 0.125 mg once daily, Lasix 40 mg once daily, Spironolactone 12.5 mg once daily, Entresto 49/51 mg twice daily  ?? Consider SGLT2i once diabetes is better controlled.  ?? BMET, Dig level, BNP, CBC, TSH today ?? Arrange repeat echocardiogram  ?? Arrange repeat Myoview  ?? F/u 6-8 weeks ?

## 2021-06-16 NOTE — Progress Notes (Signed)
?Cardiology Office Note:   ? ?Date:  06/17/2021  ? ?ID:  David [name], DOB 1961/03/26, MRN [id number] ? ?PCP:  Gildardo Pounds, NP  ?[facility] Providers ?Cardiologist:  Jenkins Rouge, MD    ?Referring MD: Gildardo Pounds, NP  ? ?Chief Complaint:  F/u for AFib, CHF ?  ? ?Patient Profile: ?Diabetes mellitus  ?Persistent atrial fibrillation  ?Failed DCCV in 09/2017 ?Attempted Sotalol with plan to repeat DCCV; DCCV not done/pt stopped meds  ?(HFrEF) heart failure with reduced ejection fraction  ?Probable tachycardia mediated vs HTN ?Myoview in 2019 with ?mild anterolat ischemia - Focus was on Rx of AF at that time; no report CP ?Suspected OSA  ?Hypertension  ?  ?Prior CV studies: ?Echocardiogram 10/17/2018 ?EF 35-40, moderate LVH, moderately reduced RVSF, mild to moderate MR, RVSP 36.3 ?  ?Myoview 09/29/2017 ?Mild mid anterolateral reversibility-equivocal finding, EF 44; intermediate risk ?Reviewed by Dr. Johnsie Cancel - no infarct; ?mild ant-lat ischemia >> focus on Rx of AFib at that time ? ? ?History of Present Illness:   ?David [name] is a 61 y.o. male with the above problem list.  He has a prior hx of AFib with failed DCCV.  He was placed on Sotalol with plans to repeat DCCV. However, he never returned for the procedure and stopped his medications.  He was admitted in 10/2018 with AF with RVR and low EF.  The plan at that time was to attempt rate control and repeat his echocardiogram 3 mos later to check for improvement.  if his EF did not improve, he would need an ischemic evaluation.  But, he was lost to f/u.  He was last seen in clinic in 3/22.  He was supposed to return in 3 mos and returns for the 1st time today.   ? ?He is here alone.  He notes fatigue and lethargy for about a year.  He has some shortness of breath when he climbs stairs, etc.  He has not had chest pain, syncope, orthopnea, leg edema.  He does not smoke.      ?   ?Past Medical History:  ?Diagnosis Date  ? Cardiomyopathy ([place])   ? a. EF 45% in 2019.  ?  Chronic anticoagulation 09/30/2017  ? Diabetes mellitus type 2 in nonobese [facility])   ? Does not have health insurance   ? Essential hypertension 09/26/2017  ? Financial difficulties   ? H/O noncompliance with medical treatment, presenting hazards to health   ? Non-insulin treated type 2 diabetes mellitus ([place]) 09/26/2017  ? Persistent atrial fibrillation ([place])   ? Sleep apnea suspected 09/30/2017  ? ?Current Medications: ?Current Meds  ?Medication Sig  ? apixaban (ELIQUIS) 5 MG TABS tablet Take 1 tablet (5 mg total) by mouth 2 (two) times daily.  ? carvedilol (COREG) 25 MG tablet Take 1 tablet (25 mg total) by mouth 2 (two) times daily.  ? fluticasone (FLONASE) 50 MCG/ACT nasal spray Place 2 sprays into both nostrils daily.  ? furosemide (LASIX) 40 MG tablet Take 1 tablet (40 mg total) by mouth daily.  ? insulin aspart (NOVOLOG) 100 UNIT/ML FlexPen Inject 6 Units into the skin in the morning, at noon, in the evening, and at bedtime.  ? insulin detemir (LEVEMIR FLEXTOUCH) 100 UNIT/ML FlexPen Inject 20 Units into the skin at bedtime. NEEDS PASS  ? sacubitril-valsartan (ENTRESTO) 49-51 MG Take 1 tablet by mouth 2 (two) times daily.  ? spironolactone (ALDACTONE) 25 MG tablet Take 0.5 tablets (12.5 mg total) by mouth daily.  ?  TRUEplus Lancets 28G MISC Use as instructed. Check blood glucose level by fingerstick 3 times per day.  ?  ?Allergies:   Patient has no known allergies.  ? ?Social History  ? ?Tobacco Use  ? Smoking status: Former  ?  Years: 15.00  ?  Types: Cigarettes  ?  Quit date: 03/18/2018  ?  Years since quitting: 3.2  ? Smokeless tobacco: Never  ? Tobacco comments:  ?  09/26/2017 "2-3 cigarettes/month now"  ?Vaping Use  ? Vaping Use: Never used  ?Substance Use Topics  ? Alcohol use: Yes  ?  Alcohol/week: 3.0 standard drinks  ?  Types: 3 Cans of beer per week  ? Drug use: Never  ?  ?Family Hx: ?The patient's family history includes Hypertension in his mother. ? ?Review of Systems  ?Gastrointestinal:  Negative for  hematochezia.  ?Genitourinary:  Negative for hematuria.   ? ?EKGs/Labs/Other Test Reviewed:   ? ?EKG:  EKG is  ordered today.  The ekg ordered today demonstrates AFib, HR 90, normal axis, low voltage limb leads, PRWP, QTc 455 ms, no change from prior tracing. ? ?Recent Labs: ?01/08/2021: ALT 17; BUN 21; Creatinine, Ser 1.32; Hemoglobin 16.9; Platelets 220; Potassium 4.4; Sodium 133  ? ?Recent Lipid Panel ?Recent Labs  ?  01/08/21 ?1643  ?CHOL 233*  ?TRIG 414*  ?HDL 38*  ?LDLCALC 122*  ?  ? ?Risk Assessment/Calculations:   ? ?CHA2DS2-VASc Score = 3  ? This indicates a 3.2% annual risk of stroke. ?The patient's score is based upon: ?CHF History: 1 ?HTN History: 1 ?Diabetes History: 1 ?Stroke History: 0 ?Vascular Disease History: 0 ?Age Score: 0 ?Gender Score: 0 ?  ? ?    ?Physical Exam:   ? ?VS:  BP 110/80 (BP Location: Right Arm, Patient Position: Sitting, Cuff Size: Normal)   Pulse 90   Ht 6' (1.829 m)   Wt 187 lb 9.6 oz (85.1 kg)   SpO2 97%   BMI 25.44 kg/m?    ? ?Wt Readings from Last 3 Encounters:  ?06/16/21 187 lb 9.6 oz (85.1 kg)  ?01/08/21 176 lb (79.8 kg)  ?06/09/20 191 lb (86.6 kg)  ?  ?Constitutional:   ?   Appearance: Healthy appearance. Not in distress.  ?Neck:  ?   Vascular: JVD normal.  ?Pulmonary:  ?   Effort: Pulmonary effort is normal.  ?   Breath sounds: No wheezing. No rales.  ?Cardiovascular:  ?   Normal rate. Irregularly irregular rhythm. Normal S1. Normal S2.   ?   Murmurs: There is no murmur.  ?Edema: ?   Peripheral edema absent.  ?Abdominal:  ?   Palpations: Abdomen is soft.  ?Skin: ?   General: Skin is warm and dry.  ?Neurological:  ?   General: No focal deficit present.  ?   Mental Status: Alert and oriented to person, place and time.  ?   Cranial Nerves: Cranial nerves are intact.  ?  ?    ?ASSESSMENT & PLAN:   ?Chronic HFrEF (heart failure with reduced ejection fraction) ([place]) ?EF 35-40 by echocardiogram in 10/2018.  It has been suspected that he has a non-ischemic cardiomyopathy due to  tachycardia from AFib.  He has had poor follow up and was last seen 12 months ago.  He had a Myoview in 2019 with ? Ant-lat ischemia.  It was felt that he needed rate control first then pursue further ischemic workup.  He was supposed to get an echocardiogram to reassess LVF after his  last visit but this was never done.  He has poor energy and feels lethargic.  He has some shortness of breath with certain activities and describes NYHA II symptoms.  His exam does not suggest volume excess.  His diabetes is poorly controlled ( 01/08/2021: Hemoglobin A1C 11.3).  His symptoms may be from poorly controlled diabetes mellitus, congestive heart failure.  He is at risk for coronary artery disease given his uncontrolled diabetes.  Question if his fatigue is an unusual presentation for angina.  As far as GDMT goes, I am hesitant to place him on SLGT2i in the setting of poorly controlled diabetes as it will result in massive glucosuria and put him at risk for GU infection. ?Continue Coreg 25 mg twice daily, digoxin 0.125 mg once daily, Lasix 40 mg once daily, Spironolactone 12.5 mg once daily, Entresto 49/51 mg twice daily  ?Consider SGLT2i once diabetes is better controlled.  ?BMET, Dig level, BNP, CBC, TSH today ?Arrange repeat echocardiogram  ?Arrange repeat Myoview  ?F/u 6-8 weeks ? ?Persistent atrial fibrillation ([place]) ?Pt failed DCCV in 2019.  Records indicate he was put on Sotalol with a plan to repeat DCCV but he stopped taking the medication.  He has been on a rate control strategy since that time.  Today, rate is controlled.  He held Dig to get his level drawn today.  Check Dig, BMET, CBC today.  Continue Eliquis 5 mg twice daily.  He asked if he could be put back into NSR.  If we determine that his fatigue may be due to atrial fibrillation, he will need to see EP to evaluate for rhythm control.  Obtain f/u echocardiogram which will let us know what his atrial size is currently.   ? ?Essential hypertension ?Controlled.   Continue Coreg 25 mg twice daily, Spironolactone 12.5 mg once daily, Entresto 49/51 mg twice daily.   ? ?Insulin dependent type 2 diabetes mellitus ([place]) ?Continue f/u with primary care.  ? ?Pure hypercho

## 2021-06-16 NOTE — Assessment & Plan Note (Signed)
Pt failed DCCV in 2019.  Records indicate he was put on Sotalol with a plan to repeat DCCV but he stopped taking the medication.  He has been on a rate control strategy since that time.  Today, rate is controlled.  He held Dig to get his level drawn today.  Check Dig, BMET, CBC today.  Continue Eliquis 5 mg twice daily.  He asked if he could be put back into NSR.  If we determine that his fatigue may be due to atrial fibrillation, he will need to see EP to evaluate for rhythm control.  Obtain f/u echocardiogram which will let us know what his atrial size is currently.   ?

## 2021-06-17 ENCOUNTER — Ambulatory Visit: Payer: Self-pay | Admitting: Physician Assistant

## 2021-06-17 DIAGNOSIS — E7800 Pure hypercholesterolemia, unspecified: Secondary | ICD-10-CM | POA: Insufficient documentation

## 2021-06-17 LAB — TSH: TSH: 1.83 u[IU]/mL (ref 0.450–4.500)

## 2021-06-17 LAB — CBC
Hematocrit: 47 % (ref 37.5–51.0)
Hemoglobin: 17.3 g/dL (ref 13.0–17.7)
MCH: 33.7 pg — ABNORMAL HIGH (ref 26.6–33.0)
MCHC: 36.8 g/dL — ABNORMAL HIGH (ref 31.5–35.7)
MCV: 91 fL (ref 79–97)
Platelets: 244 10*3/uL (ref 150–450)
RBC: 5.14 x10E6/uL (ref 4.14–5.80)
RDW: 12.7 % (ref 11.6–15.4)
WBC: 8.7 10*3/uL (ref 3.4–10.8)

## 2021-06-17 LAB — BASIC METABOLIC PANEL
BUN/Creatinine Ratio: 18 (ref 9–20)
BUN: 21 mg/dL (ref 6–24)
CO2: 22 mmol/L (ref 20–29)
Calcium: 9.5 mg/dL (ref 8.7–10.2)
Chloride: 96 mmol/L (ref 96–106)
Creatinine, Ser: 1.2 mg/dL (ref 0.76–1.27)
Glucose: 227 mg/dL — ABNORMAL HIGH (ref 70–99)
Potassium: 4.5 mmol/L (ref 3.5–5.2)
Sodium: 131 mmol/L — ABNORMAL LOW (ref 134–144)
eGFR: 70 mL/min/{1.73_m2} (ref >59)

## 2021-06-17 LAB — PRO B NATRIURETIC PEPTIDE: NT-Pro BNP: 468 pg/mL — ABNORMAL HIGH (ref 0–210)

## 2021-06-17 NOTE — Assessment & Plan Note (Signed)
Controlled.  Continue Coreg 25 mg twice daily, Spironolactone 12.5 mg once daily, Entresto 49/51 mg twice daily.   ?

## 2021-06-17 NOTE — Assessment & Plan Note (Signed)
Continue f/u with primary care.  

## 2021-06-17 NOTE — Assessment & Plan Note (Signed)
LDL in 10/22.  Goal should be at least < 100.  He is off statin Rx for unclear reasons.  This can be addressed at his next OV.   ?

## 2021-06-25 ENCOUNTER — Other Ambulatory Visit: Payer: Self-pay

## 2021-06-25 ENCOUNTER — Other Ambulatory Visit: Payer: Self-pay | Admitting: *Deleted

## 2021-06-25 ENCOUNTER — Telehealth: Payer: Self-pay | Admitting: *Deleted

## 2021-06-25 DIAGNOSIS — Z79899 Other long term (current) drug therapy: Secondary | ICD-10-CM

## 2021-06-25 MED ORDER — SPIRONOLACTONE 25 MG PO TABS
25.0000 mg | ORAL_TABLET | Freq: Every day | ORAL | 3 refills | Status: DC
  Filled 2021-06-25: qty 90, 90d supply, fill #0
  Filled 2021-07-10: qty 30, 30d supply, fill #0
  Filled 2021-08-02: qty 30, 30d supply, fill #1
  Filled 2021-09-26: qty 30, 30d supply, fill #2
  Filled 2021-10-04: qty 30, 30d supply, fill #3

## 2021-06-25 NOTE — Telephone Encounter (Signed)
-----   Message from Sampson Goon, RN sent at 06/25/2021  2:06 PM EDT ----- ? ?----- Message ----- ?From: Beatrice Lecher, PA-C ?Sent: 06/17/2021   8:39 AM EDT ?To: [facility] Triage ? ?Creatinine, K+ normal. BNP minimally elevated.  Hgb normal.  TSH normal.   ?I asked for a Dig level yesterday.  It does not look like it was ordered.   ?PLAN:  ?-Increase Spironolactone to 25 mg once daily  ?-BMET 1 week ?-Add dig level to blood in lab  ?-If dig level cannot be added, reorder and get with BMET 1 week (make sure he does not take Dig the day of blood draw until after blood drawn) ?Tereso Newcomer, PA-C    ?06/17/2021 8:34 AM   ? ?

## 2021-07-10 ENCOUNTER — Other Ambulatory Visit: Payer: Self-pay | Admitting: Family Medicine

## 2021-07-10 ENCOUNTER — Other Ambulatory Visit: Payer: Self-pay | Admitting: Nurse Practitioner

## 2021-07-10 DIAGNOSIS — I429 Cardiomyopathy, unspecified: Secondary | ICD-10-CM

## 2021-07-10 DIAGNOSIS — I480 Paroxysmal atrial fibrillation: Secondary | ICD-10-CM

## 2021-07-10 DIAGNOSIS — I10 Essential (primary) hypertension: Secondary | ICD-10-CM

## 2021-07-11 MED ORDER — FUROSEMIDE 40 MG PO TABS
40.0000 mg | ORAL_TABLET | Freq: Every day | ORAL | 0 refills | Status: DC
  Filled 2021-07-11: qty 30, 30d supply, fill #0

## 2021-07-11 MED ORDER — CARVEDILOL 25 MG PO TABS
25.0000 mg | ORAL_TABLET | Freq: Two times a day (BID) | ORAL | 0 refills | Status: DC
  Filled 2021-07-11: qty 60, 30d supply, fill #0

## 2021-07-11 MED ORDER — DIGOXIN 125 MCG PO TABS
125.0000 ug | ORAL_TABLET | Freq: Every day | ORAL | 0 refills | Status: DC
  Filled 2021-07-11: qty 30, 30d supply, fill #0

## 2021-07-12 ENCOUNTER — Encounter (HOSPITAL_COMMUNITY): Payer: Self-pay | Admitting: *Deleted

## 2021-07-12 ENCOUNTER — Other Ambulatory Visit: Payer: Self-pay

## 2021-07-12 ENCOUNTER — Telehealth (HOSPITAL_COMMUNITY): Payer: Self-pay | Admitting: *Deleted

## 2021-07-12 NOTE — Telephone Encounter (Signed)
MyChart letter sent outlining instructions for upcoming stress test on 07/19/21 @ 8:15. ?

## 2021-07-13 ENCOUNTER — Other Ambulatory Visit: Payer: Self-pay

## 2021-07-14 ENCOUNTER — Other Ambulatory Visit: Payer: Self-pay

## 2021-07-14 ENCOUNTER — Ambulatory Visit: Payer: Self-pay | Admitting: Physician Assistant

## 2021-07-14 MED ORDER — ENTRESTO 49-51 MG PO TABS
1.0000 | ORAL_TABLET | Freq: Two times a day (BID) | ORAL | 1 refills | Status: DC
Start: 2021-07-14 — End: 2021-09-07
  Filled 2021-07-14: qty 60, 30d supply, fill #0

## 2021-07-19 ENCOUNTER — Encounter (HOSPITAL_COMMUNITY): Payer: Self-pay

## 2021-07-19 ENCOUNTER — Other Ambulatory Visit (HOSPITAL_COMMUNITY): Payer: Self-pay

## 2021-07-24 ENCOUNTER — Other Ambulatory Visit: Payer: Self-pay

## 2021-07-24 ENCOUNTER — Other Ambulatory Visit: Payer: Self-pay | Admitting: Family Medicine

## 2021-07-24 DIAGNOSIS — I10 Essential (primary) hypertension: Secondary | ICD-10-CM

## 2021-07-24 DIAGNOSIS — I429 Cardiomyopathy, unspecified: Secondary | ICD-10-CM

## 2021-07-26 MED ORDER — CARVEDILOL 25 MG PO TABS
25.0000 mg | ORAL_TABLET | Freq: Two times a day (BID) | ORAL | 2 refills | Status: DC
  Filled 2021-08-02: qty 60, 30d supply, fill #0

## 2021-07-26 MED ORDER — FUROSEMIDE 40 MG PO TABS
40.0000 mg | ORAL_TABLET | Freq: Every day | ORAL | 2 refills | Status: DC
  Filled 2021-08-02: qty 30, 30d supply, fill #0

## 2021-07-26 NOTE — Telephone Encounter (Signed)
Requested medication (s) are due for refill today: yes ? ?Requested medication (s) are on the active medication list: yes ? ?Last refill:  07/10/21 ? ?Future visit scheduled: yes ? ?Notes to clinic:  Unable to refill per protocol, courtesy refill already given, routing for provider approval.  ? ? ?  ?Requested Prescriptions  ?Pending Prescriptions Disp Refills  ? furosemide (LASIX) 40 MG tablet 30 tablet 0  ?  Sig: Take 1 tablet (40 mg total) by mouth daily.  ?  ? Cardiovascular:  Diuretics - Loop Failed - 07/24/2021 12:12 AM  ?  ?  Failed - Na in normal range and within 180 days  ?  Sodium  ?Date Value Ref Range Status  ?06/16/2021 131 (L) 134 - 144 mmol/L Final  ?  ?  ?  ?  Failed - Mg Level in normal range and within 180 days  ?  Magnesium  ?Date Value Ref Range Status  ?10/19/2018 1.8 1.7 - 2.4 mg/dL Final  ?  Comment:  ?  Performed at [facility], [address], [place], [place] [address]  ?  ?  ?  ?  Failed - Valid encounter within last 6 months  ?  Recent Outpatient Visits   ? ?      ? 6 months ago Type 2 diabetes mellitus with hyperglycemia, with long-term current use of insulin (HCC)  ? [facility] [place], Shea Stakes, NP  ? 1 year ago Essential hypertension  ? [facility] [place], [place], NP  ? 1 year ago Diabetes mellitus type 2, uncontrolled, with complications (HCC)  ? [facility] [place], [place], [place]  ? 2 years ago Diabetes mellitus type 2, uncontrolled, with complications (HCC)  ? [facility] Claiborne Rigg, NP  ? ?  ?  ?Future Appointments   ? ?        ? In 3 weeks Beatrice Lecher, PA-C [facility] [address], [facility]  ? In 1 month Delford Field, Charlcie Cradle, MD [facility]  ? ?  ? ? ?  ?  ?  Passed - K in normal range and within 180 days  ?  Potassium  ?Date Value Ref Range Status  ?06/16/2021 4.5 3.5 - 5.2 mmol/L Final  ?  ?  ?  ?  Passed  - Ca in normal range and within 180 days  ?  Calcium  ?Date Value Ref Range Status  ?06/16/2021 9.5 8.7 - 10.2 mg/dL Final  ?  ?  ?  ?  Passed - Cr in normal range and within 180 days  ?  Creatinine, Ser  ?Date Value Ref Range Status  ?06/16/2021 1.20 0.76 - 1.27 mg/dL Final  ?  ?  ?  ?  Passed - Cl in normal range and within 180 days  ?  Chloride  ?Date Value Ref Range Status  ?06/16/2021 96 96 - 106 mmol/L Final  ?  ?  ?  ?  Passed - Last BP in normal range  ?  BP Readings from Last 1 Encounters:  ?06/16/21 110/80  ?  ?  ?  ?  ? carvedilol (COREG) 25 MG tablet 60 tablet 0  ?  Sig: Take 1 tablet (25 mg total) by mouth 2 (two) times daily.  ?  ? Cardiovascular: Beta Blockers 3 Failed - 07/24/2021 12:12 AM  ?  ?  Failed - Valid encounter within last 6 months  ?  Recent Outpatient Visits   ? ?      ? 6 months ago Type 2 diabetes mellitus with hyperglycemia, with long-term current use of insulin (HCC)  ? [facility] [place], Shea Stakes, NP  ? 1 year ago Essential hypertension  ? [facility] Well[facility] [place], [place], NP  ? 1 year ago Diabetes mellitus type 2, uncontrolled, with complications (HCC)  ? [facility] And Well[facility] [place], [place], [place]  ? 2 years ago Diabetes mellitus type 2, uncontrolled, with complications (HCC)  ? [facility] And Wellness Claiborne Rigg, NP  ? ?  ?  ?Future Appointments   ? ?        ? In 3 weeks Beatrice Lecher, PA-C [facility] [address] [facility]  ? In 1 month Delford Field, Charlcie Cradle, MD [facility] And Wellness  ? ?  ? ? ?  ?  ?  Passed - Cr in normal range and within 360 days  ?  Creatinine, Ser  ?Date Value Ref Range Status  ?06/16/2021 1.20 0.76 - 1.27 mg/dL Final  ?  ?  ?  ?  Passed - AST in normal range and within 360 days  ?  AST  ?Date Value Ref Range Status  ?01/08/2021 17 0 - 40 IU/L Final  ?  ?  ?  ?  Passed - ALT in normal range and within 360 days  ?  ALT  ?Date  Value Ref Range Status  ?01/08/2021 17 0 - 44 IU/L Final  ?  ?  ?  ?  Passed - Last BP in normal range  ?  BP Readings from Last 1 Encounters:  ?06/16/21 110/80  ?  ?  ?  ?  Passed - Last Heart Rate in normal range  ?  Pulse Readings from Last 1 Encounters:  ?06/16/21 90  ?  ?  ?  ?  ? ? ?

## 2021-08-02 ENCOUNTER — Other Ambulatory Visit: Payer: Self-pay

## 2021-08-04 ENCOUNTER — Other Ambulatory Visit: Payer: Self-pay | Admitting: Pharmacist

## 2021-08-04 ENCOUNTER — Other Ambulatory Visit: Payer: Self-pay

## 2021-08-04 MED ORDER — BASAGLAR KWIKPEN 100 UNIT/ML ~~LOC~~ SOPN
20.0000 [IU] | PEN_INJECTOR | Freq: Every day | SUBCUTANEOUS | 1 refills | Status: DC
  Filled 2021-08-04: qty 6, 30d supply, fill #0

## 2021-08-17 NOTE — Progress Notes (Deleted)
Cardiology Office Note:    Date:  08/17/2021   ID:  [name], DOB [birth year], MRN [id number]  PCP:  Claiborne Rigg, NP  [facility] Providers Cardiologist:  Charlton Haws, MD { Click to update primary MD,subspecialty MD or APP then REFRESH:1}  *** Referring MD: Claiborne Rigg, NP   Chief Complaint:  No chief complaint on file. {Click here for Visit Info    :1}   Patient Profile: Diabetes mellitus  Persistent atrial fibrillation  Failed DCCV in 09/2017 Attempted Sotalol with plan to repeat DCCV; DCCV not done/pt stopped meds  (HFrEF) heart failure with reduced ejection fraction  Probable tachycardia mediated vs HTN Myoview in 2019 with ?mild anterolat ischemia - Focus was on Rx of AF at that time; no report CP Suspected OSA  Hypertension   Prior CV Studies: *** {Select studies to display:26339}  Echocardiogram 10/17/2018 EF 35-40, moderate LVH, moderately reduced RVSF, mild to moderate MR, RVSP 36.3   Myoview 09/29/2017 Mild mid anterolateral reversibility-equivocal finding, EF 44; intermediate risk Reviewed by Dr. Eden Emms - no infarct; ?mild ant-lat ischemia >> focus on Rx of AFib at that time   History of Present Illness:   David [name] is a 61 y.o. male with the above problem list.  He has a prior hx of AFib with failed DCCV.  He was placed on Sotalol with plans to repeat DCCV. However, he never returned for the procedure and stopped his medications.  He was admitted in 10/2018 with AF with RVR and low EF.  The plan at that time was to attempt rate control and repeat his echocardiogram 3 mos later to check for improvement.  If his EF did not improve, he would need an ischemic evaluation.  But, he was lost to f/u.  He was seen March 2023 for the first time in a year.      At his last visit he noted poor energy, fatigue and shortness of breath.  I did not place him on SGLT2i due to uncontrolled diabetes (A1c was 11).   I did adjust his spironolactone.  I ordered an  echocardiogram and Myoview but these tests were never done.  He returns for f/u.  ***    Past Medical History:  Diagnosis Date   Cardiomyopathy (HCC)    a. EF 45% in 2019.   Chronic anticoagulation 09/30/2017   Diabetes mellitus type 2 in nonobese [facility])    Does not have health insurance    Essential hypertension 09/26/2017   Financial difficulties    H/O noncompliance with medical treatment, presenting hazards to health    Non-insulin treated type 2 diabetes mellitus (HCC) 09/26/2017   Persistent atrial fibrillation (HCC)    Sleep apnea suspected 09/30/2017   Current Medications: No outpatient medications have been marked as taking for the 08/18/21 encounter (Appointment) with Tereso Newcomer T, PA-C.    Allergies:   Patient has no known allergies.   Social History   Tobacco Use   Smoking status: Former    Years: 15.00    Types: Cigarettes    Quit date: 03/18/2018    Years since quitting: 3.4   Smokeless tobacco: Never   Tobacco comments:    09/26/2017 "2-3 cigarettes/month now"  Vaping Use   Vaping Use: Never used  Substance Use Topics   Alcohol use: Yes    Alcohol/week: 3.0 standard drinks    Types: 3 Cans of beer per week   Drug use: Never    Family Hx: The patient's family  history includes Hypertension in his mother.  ROS   EKGs/Labs/Other Test Reviewed:    EKG:  EKG is *** ordered today.  The ekg ordered today demonstrates ***  Recent Labs: 01/08/2021: ALT 17 06/16/2021: BUN 21; Creatinine, Ser 1.20; Hemoglobin 17.3; NT-Pro BNP 468; Platelets 244; Potassium 4.5; Sodium 131; TSH 1.830   Recent Lipid Panel Recent Labs    01/08/21 1643  CHOL 233*  TRIG 414*  HDL 38*  LDLCALC 122*     Risk Assessment/Calculations:   {Does this patient have ATRIAL FIBRILLATION?:[id number]}     Physical Exam:    VS:  There were no vitals taken for this visit.    Wt Readings from Last 3 Encounters:  06/16/21 187 lb 9.6 oz (85.1 kg)  01/08/21 176 lb (79.8 kg)  06/09/20 191  lb (86.6 kg)    Physical Exam ***     ASSESSMENT & PLAN:   No problem-specific Assessment & Plan notes found for this encounter. *** Chronic HFrEF (heart failure with reduced ejection fraction) (HCC) EF 35-40 by echocardiogram in 10/2018.  It has been suspected that he has a non-ischemic cardiomyopathy due to tachycardia from AFib.  He has had poor follow up and was last seen 12 months ago.  He had a Myoview in 2019 with ? Ant-lat ischemia.  It was felt that he needed rate control first then pursue further ischemic workup.  He was supposed to get an echocardiogram to reassess LVF after his last visit but this was never done.  He has poor energy and feels lethargic.  He has some shortness of breath with certain activities and describes NYHA II symptoms.  His exam does not suggest volume excess.  His diabetes is poorly controlled ( 01/08/2021: Hemoglobin A1C 11.3).  His symptoms may be from poorly controlled diabetes mellitus, congestive heart failure.  He is at risk for coronary artery disease given his uncontrolled diabetes.  Question if his fatigue is an unusual presentation for angina.  As far as GDMT goes, I am hesitant to place him on SLGT2i in the setting of poorly controlled diabetes as it will result in massive glucosuria and put him at risk for GU infection. Continue Coreg 25 mg twice daily, digoxin 0.125 mg once daily, Lasix 40 mg once daily, Spironolactone 12.5 mg once daily, Entresto 49/51 mg twice daily  Consider SGLT2i once diabetes is better controlled.  BMET, Dig level, BNP, CBC, TSH today Arrange repeat echocardiogram  Arrange repeat Myoview  F/u 6-8 weeks   Persistent atrial fibrillation (HCC) Pt failed DCCV in 2019.  Records indicate he was put on Sotalol with a plan to repeat DCCV but he stopped taking the medication.  He has been on a rate control strategy since that time.  Today, rate is controlled.  He held Dig to get his level drawn today.  Check Dig, BMET, CBC today.  Continue  Eliquis 5 mg twice daily.  He asked if he could be put back into NSR.  If we determine that his fatigue may be due to atrial fibrillation, he will need to see EP to evaluate for rhythm control.  Obtain f/u echocardiogram which will let us know what his atrial size is currently.     Essential hypertension Controlled.  Continue Coreg 25 mg twice daily, Spironolactone 12.5 mg once daily, Entresto 49/51 mg twice daily.     Insulin dependent type 2 diabetes mellitus (HCC) Continue f/u with primary care.    Pure hypercholesterolemia LDL in 10/22.  Goal should  be at least < 100.  He is off statin Rx for unclear reasons.  This can be addressed at his next OV.        {Are you ordering a CV Procedure (e.g. stress test, cath, DCCV, TEE, etc)?   Press F2        :[id number]}  Dispo:  No follow-ups on file.   Medication Adjustments/Labs and Tests Ordered: Current medicines are reviewed at length with the patient today.  Concerns regarding medicines are outlined above.  Tests Ordered: No orders of the defined types were placed in this encounter.  Medication Changes: No orders of the defined types were placed in this encounter.  Signed, Tereso Newcomer, PA-C  08/17/2021 7:56 PM    [facility] [address] [place], [place], [place]  [address] Phone: [phone]; Fax: [phone]

## 2021-08-18 ENCOUNTER — Ambulatory Visit: Payer: Self-pay | Admitting: Physician Assistant

## 2021-08-18 DIAGNOSIS — I10 Essential (primary) hypertension: Secondary | ICD-10-CM

## 2021-08-18 DIAGNOSIS — I4819 Other persistent atrial fibrillation: Secondary | ICD-10-CM

## 2021-08-18 DIAGNOSIS — E7800 Pure hypercholesterolemia, unspecified: Secondary | ICD-10-CM

## 2021-08-18 DIAGNOSIS — I5022 Chronic systolic (congestive) heart failure: Secondary | ICD-10-CM

## 2021-08-19 ENCOUNTER — Telehealth (HOSPITAL_COMMUNITY): Payer: Self-pay

## 2021-08-19 NOTE — Telephone Encounter (Signed)
Spoke to the patient, detailed instructions given. He stated that he would be here for his test. Asked to call back with any questions. S.Lenell Lama EMTP 

## 2021-08-23 ENCOUNTER — Telehealth (HOSPITAL_COMMUNITY): Payer: Self-pay | Admitting: *Deleted

## 2021-08-23 NOTE — Telephone Encounter (Signed)
Left message on voicemail per DPR in reference to upcoming appointment scheduled on 08/24/21 at 10:00 with detailed instructions given per Myocardial Perfusion Study Information Sheet for the test. LM to arrive 15 minutes early, and that it is imperative to arrive on time for appointment to keep from having the test rescheduled. If you need to cancel or reschedule your appointment, please call the office within 24 hours of your appointment. Failure to do so may result in a cancellation of your appointment, and a $50 no show fee. Phone number given for call back for any questions.

## 2021-08-24 ENCOUNTER — Encounter (HOSPITAL_COMMUNITY): Payer: Self-pay

## 2021-08-24 ENCOUNTER — Other Ambulatory Visit (HOSPITAL_COMMUNITY): Payer: Self-pay

## 2021-08-29 ENCOUNTER — Encounter (HOSPITAL_COMMUNITY): Payer: Self-pay

## 2021-08-29 ENCOUNTER — Inpatient Hospital Stay (HOSPITAL_COMMUNITY)
Admission: EM | Admit: 2021-08-29 | Discharge: 2021-09-06 | DRG: 854 | Disposition: A | Discharge status: Home Health | Payer: Self-pay | Attending: Internal Medicine | Admitting: Internal Medicine

## 2021-08-29 ENCOUNTER — Emergency Department (HOSPITAL_COMMUNITY): Payer: Self-pay

## 2021-08-29 DIAGNOSIS — M4640 Discitis, unspecified, site unspecified: Secondary | ICD-10-CM

## 2021-08-29 DIAGNOSIS — Z794 Long term (current) use of insulin: Secondary | ICD-10-CM

## 2021-08-29 DIAGNOSIS — E11628 Type 2 diabetes mellitus with other skin complications: Secondary | ICD-10-CM | POA: Diagnosis present

## 2021-08-29 DIAGNOSIS — Z91148 Patient's other noncompliance with medication regimen for other reason: Secondary | ICD-10-CM

## 2021-08-29 DIAGNOSIS — E1165 Type 2 diabetes mellitus with hyperglycemia: Secondary | ICD-10-CM | POA: Diagnosis present

## 2021-08-29 DIAGNOSIS — E785 Hyperlipidemia, unspecified: Secondary | ICD-10-CM | POA: Diagnosis present

## 2021-08-29 DIAGNOSIS — L02611 Cutaneous abscess of right foot: Secondary | ICD-10-CM | POA: Diagnosis present

## 2021-08-29 DIAGNOSIS — R509 Fever, unspecified: Secondary | ICD-10-CM

## 2021-08-29 DIAGNOSIS — I110 Hypertensive heart disease with heart failure: Secondary | ICD-10-CM | POA: Diagnosis present

## 2021-08-29 DIAGNOSIS — E11621 Type 2 diabetes mellitus with foot ulcer: Secondary | ICD-10-CM | POA: Diagnosis present

## 2021-08-29 DIAGNOSIS — R61 Generalized hyperhidrosis: Secondary | ICD-10-CM | POA: Diagnosis present

## 2021-08-29 DIAGNOSIS — M86171 Other acute osteomyelitis, right ankle and foot: Secondary | ICD-10-CM | POA: Diagnosis present

## 2021-08-29 DIAGNOSIS — A4102 Sepsis due to Methicillin resistant Staphylococcus aureus: Principal | ICD-10-CM | POA: Diagnosis present

## 2021-08-29 DIAGNOSIS — I96 Gangrene, not elsewhere classified: Secondary | ICD-10-CM

## 2021-08-29 DIAGNOSIS — I4821 Permanent atrial fibrillation: Secondary | ICD-10-CM

## 2021-08-29 DIAGNOSIS — A419 Sepsis, unspecified organism: Secondary | ICD-10-CM | POA: Diagnosis present

## 2021-08-29 DIAGNOSIS — I4891 Unspecified atrial fibrillation: Principal | ICD-10-CM

## 2021-08-29 DIAGNOSIS — I70235 Atherosclerosis of native arteries of right leg with ulceration of other part of foot: Secondary | ICD-10-CM | POA: Diagnosis present

## 2021-08-29 DIAGNOSIS — Z7901 Long term (current) use of anticoagulants: Secondary | ICD-10-CM

## 2021-08-29 DIAGNOSIS — E871 Hypo-osmolality and hyponatremia: Secondary | ICD-10-CM | POA: Diagnosis present

## 2021-08-29 DIAGNOSIS — L03119 Cellulitis of unspecified part of limb: Secondary | ICD-10-CM

## 2021-08-29 DIAGNOSIS — R0602 Shortness of breath: Secondary | ICD-10-CM | POA: Diagnosis present

## 2021-08-29 DIAGNOSIS — L03031 Cellulitis of right toe: Secondary | ICD-10-CM | POA: Diagnosis present

## 2021-08-29 DIAGNOSIS — E860 Dehydration: Secondary | ICD-10-CM | POA: Diagnosis present

## 2021-08-29 DIAGNOSIS — Z5181 Encounter for therapeutic drug level monitoring: Secondary | ICD-10-CM

## 2021-08-29 DIAGNOSIS — I429 Cardiomyopathy, unspecified: Secondary | ICD-10-CM

## 2021-08-29 DIAGNOSIS — L97519 Non-pressure chronic ulcer of other part of right foot with unspecified severity: Secondary | ICD-10-CM | POA: Diagnosis present

## 2021-08-29 DIAGNOSIS — R6510 Systemic inflammatory response syndrome (SIRS) of non-infectious origin without acute organ dysfunction: Secondary | ICD-10-CM | POA: Diagnosis present

## 2021-08-29 DIAGNOSIS — M869 Osteomyelitis, unspecified: Secondary | ICD-10-CM

## 2021-08-29 DIAGNOSIS — E1169 Type 2 diabetes mellitus with other specified complication: Secondary | ICD-10-CM | POA: Diagnosis present

## 2021-08-29 DIAGNOSIS — N179 Acute kidney failure, unspecified: Secondary | ICD-10-CM | POA: Diagnosis present

## 2021-08-29 DIAGNOSIS — I10 Essential (primary) hypertension: Secondary | ICD-10-CM | POA: Diagnosis present

## 2021-08-29 DIAGNOSIS — Z79899 Other long term (current) drug therapy: Secondary | ICD-10-CM

## 2021-08-29 DIAGNOSIS — E119 Type 2 diabetes mellitus without complications: Secondary | ICD-10-CM

## 2021-08-29 DIAGNOSIS — R002 Palpitations: Secondary | ICD-10-CM | POA: Diagnosis present

## 2021-08-29 DIAGNOSIS — I5020 Unspecified systolic (congestive) heart failure: Secondary | ICD-10-CM | POA: Diagnosis present

## 2021-08-29 DIAGNOSIS — E1140 Type 2 diabetes mellitus with diabetic neuropathy, unspecified: Secondary | ICD-10-CM | POA: Diagnosis present

## 2021-08-29 DIAGNOSIS — Z20822 Contact with and (suspected) exposure to covid-19: Secondary | ICD-10-CM | POA: Diagnosis present

## 2021-08-29 DIAGNOSIS — Z87891 Personal history of nicotine dependence: Secondary | ICD-10-CM

## 2021-08-29 DIAGNOSIS — I5022 Chronic systolic (congestive) heart failure: Secondary | ICD-10-CM | POA: Diagnosis present

## 2021-08-29 DIAGNOSIS — Z8249 Family history of ischemic heart disease and other diseases of the circulatory system: Secondary | ICD-10-CM

## 2021-08-29 DIAGNOSIS — I4819 Other persistent atrial fibrillation: Secondary | ICD-10-CM | POA: Diagnosis present

## 2021-08-29 DIAGNOSIS — E1152 Type 2 diabetes mellitus with diabetic peripheral angiopathy with gangrene: Secondary | ICD-10-CM | POA: Diagnosis present

## 2021-08-29 HISTORY — DX: Heart failure, unspecified: I50.9

## 2021-08-29 LAB — LACTIC ACID, PLASMA
Lactic Acid, Venous: 1.4 mmol/L (ref 0.5–1.9)
Lactic Acid, Venous: 1.2 mmol/L (ref 0.5–1.9)

## 2021-08-29 LAB — CBC WITH DIFFERENTIAL/PLATELET
Abs Immature Granulocytes: 0.17 10*3/uL — ABNORMAL HIGH (ref 0.00–0.07)
Basophils Absolute: 0.1 10*3/uL (ref 0.0–0.1)
Basophils Relative: 0 %
Eosinophils Absolute: 0.1 10*3/uL (ref 0.0–0.5)
Eosinophils Relative: 0 %
HCT: 44.6 % (ref 39.0–52.0)
Hemoglobin: 15.7 g/dL (ref 13.0–17.0)
Immature Granulocytes: 1 %
Lymphocytes Relative: 4 %
Lymphs Abs: 0.8 10*3/uL (ref 0.7–4.0)
MCH: 32.9 pg (ref 26.0–34.0)
MCHC: 35.2 g/dL (ref 30.0–36.0)
MCV: 93.5 fL (ref 80.0–100.0)
Monocytes Absolute: 1.1 10*3/uL — ABNORMAL HIGH (ref 0.1–1.0)
Monocytes Relative: 6 %
Neutro Abs: 16.6 10*3/uL — ABNORMAL HIGH (ref 1.7–7.7)
Neutrophils Relative %: 89 %
Platelets: 207 10*3/uL (ref 150–400)
RBC: 4.77 MIL/uL (ref 4.22–5.81)
RDW: 12.4 % (ref 11.5–15.5)
WBC: 18.7 10*3/uL — ABNORMAL HIGH (ref 4.0–10.5)
nRBC: 0 % (ref 0.0–0.2)

## 2021-08-29 LAB — COMPREHENSIVE METABOLIC PANEL
ALT: 17 U/L (ref 0–44)
AST: 24 U/L (ref 15–41)
Albumin: 3.3 g/dL — ABNORMAL LOW (ref 3.5–5.0)
Alkaline Phosphatase: 89 U/L (ref 38–126)
Anion gap: 11 (ref 5–15)
BUN: 22 mg/dL — ABNORMAL HIGH (ref 6–20)
CO2: 21 mmol/L — ABNORMAL LOW (ref 22–32)
Calcium: 9.2 mg/dL (ref 8.9–10.3)
Chloride: 93 mmol/L — ABNORMAL LOW (ref 98–111)
Creatinine, Ser: 1.27 mg/dL — ABNORMAL HIGH (ref 0.61–1.24)
GFR, Estimated: >60 mL/min (ref >60)
Glucose, Bld: 284 mg/dL — ABNORMAL HIGH (ref 70–99)
Potassium: 4.4 mmol/L (ref 3.5–5.1)
Sodium: 125 mmol/L — ABNORMAL LOW (ref 135–145)
Total Bilirubin: 1.8 mg/dL — ABNORMAL HIGH (ref 0.3–1.2)
Total Protein: 6.8 g/dL (ref 6.5–8.1)

## 2021-08-29 LAB — URINALYSIS, ROUTINE W REFLEX MICROSCOPIC
Bacteria, UA: NONE SEEN
Bilirubin Urine: NEGATIVE
Glucose, UA: >=500 mg/dL — AB
Hgb urine dipstick: NEGATIVE
Ketones, ur: NEGATIVE mg/dL
Leukocytes,Ua: NEGATIVE
Nitrite: NEGATIVE
Protein, ur: 100 mg/dL — AB
Specific Gravity, Urine: 1.009 (ref 1.005–1.030)
pH: 6 (ref 5.0–8.0)

## 2021-08-29 LAB — RESP PANEL BY RT-PCR (FLU A&B, COVID) ARPGX2
Influenza A by PCR: NEGATIVE
Influenza B by PCR: NEGATIVE
SARS Coronavirus 2 by RT PCR: NEGATIVE

## 2021-08-29 LAB — CBG MONITORING, ED: Glucose-Capillary: 313 mg/dL — ABNORMAL HIGH (ref 70–99)

## 2021-08-29 LAB — MAGNESIUM: Magnesium: 1.6 mg/dL — ABNORMAL LOW (ref 1.7–2.4)

## 2021-08-29 IMAGING — CT CT HEAD W/O CM
4 series · 17 of 47 positions shown, 19 images · non-contrast
Comparison: None Available.

CLINICAL DATA: Headache, chronic, new features or increased
frequency



[Series 2: head wo · axial · 0.42mm/px · z∈[+1637,+1757]mm · 7 of 34 slices shown, 9 images]
[im 5/34  brain]
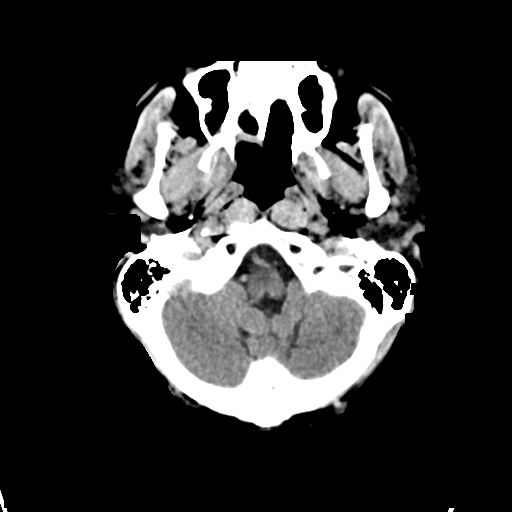
[im 5/34  bone]
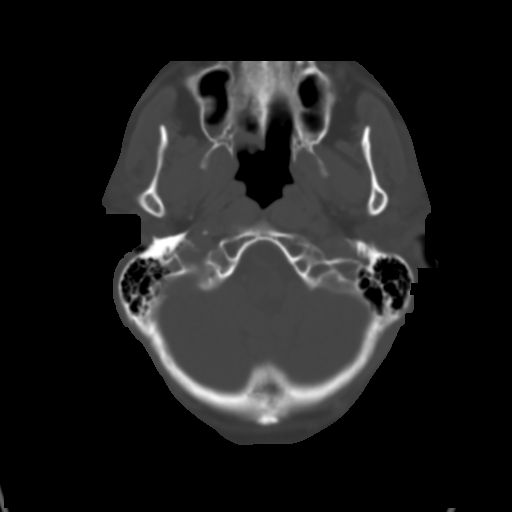
[im 9/34  brain]
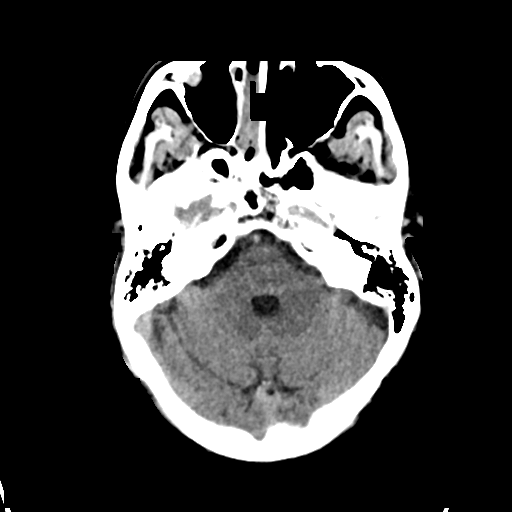
[im 13/34  brain]
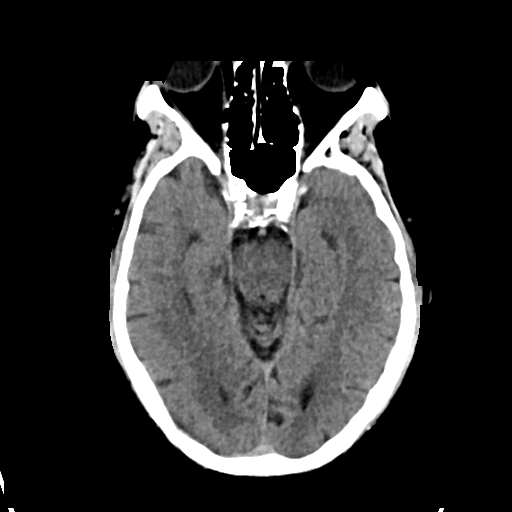
[im 17/34  brain]
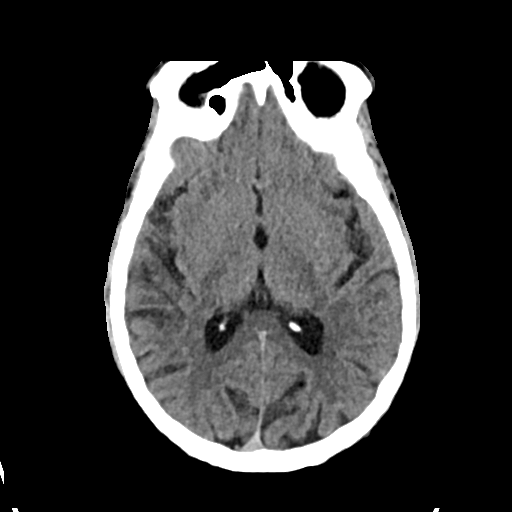
[im 21/34  brain]
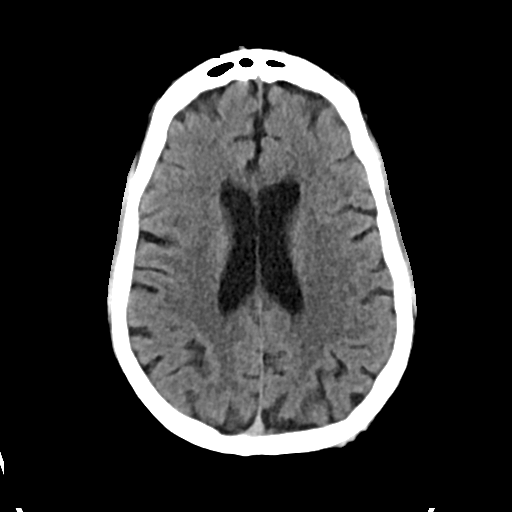
[im 21/34  bone]
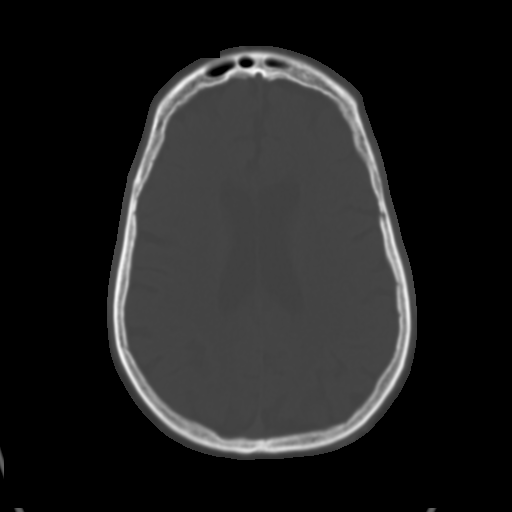
[im 25/34  brain]
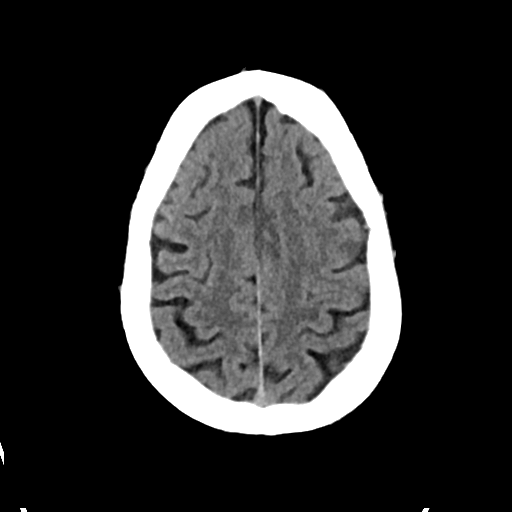
[im 29/34  brain]
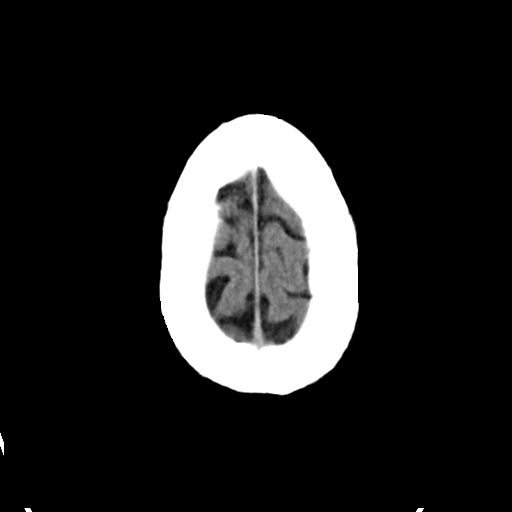

[Series 3: head bone · axial · 0.42mm/px · z∈[+1633,+1691]mm · 4 of 84 slices shown]
[im 9/84  bone]
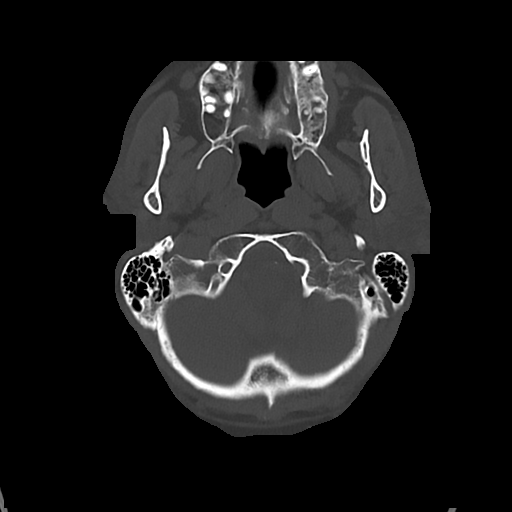
[im 17/84  bone]
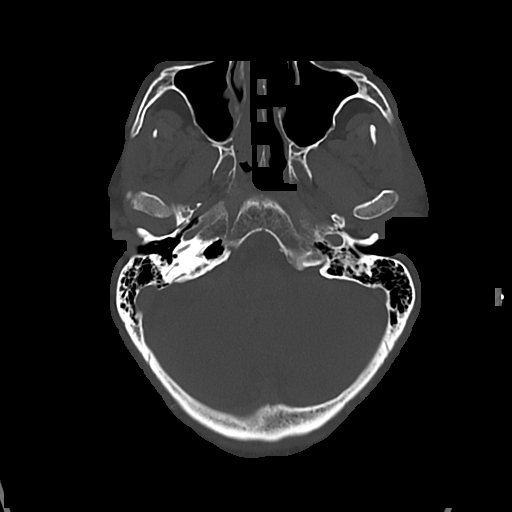
[im 25/84  bone]
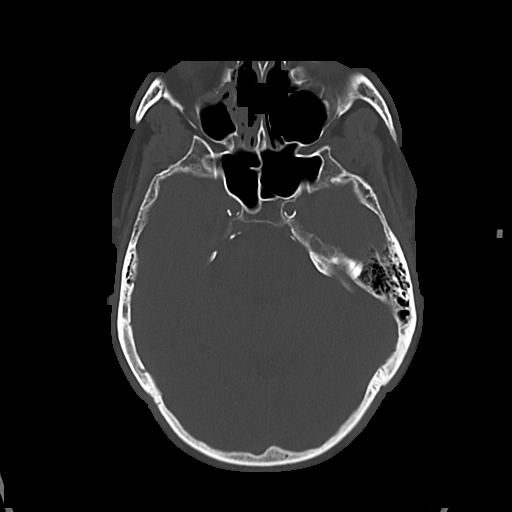
[im 38/84  bone]
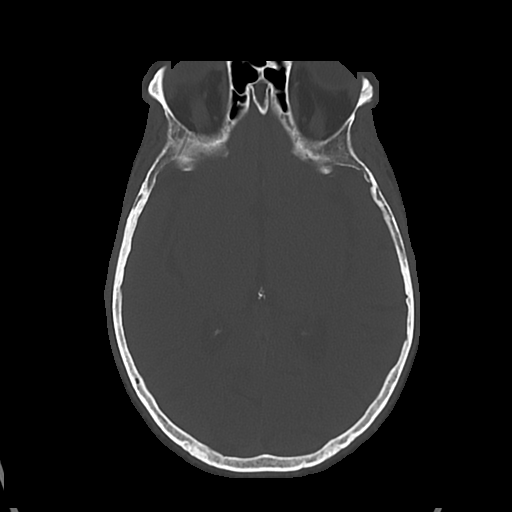

[Series 4: cor soft · coronal · 0.32mm/px · 3 of 73 slices shown]
[im 25/73  brain]
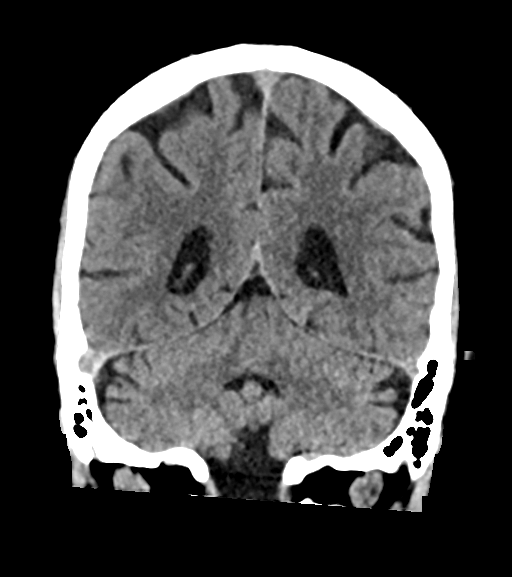
[im 33/73  brain]
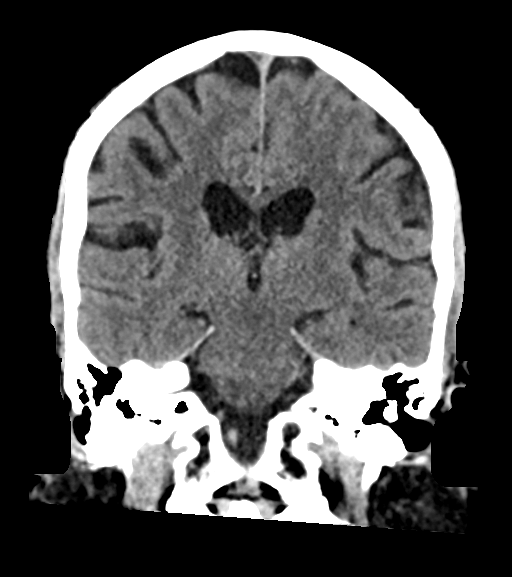
[im 41/73  brain]
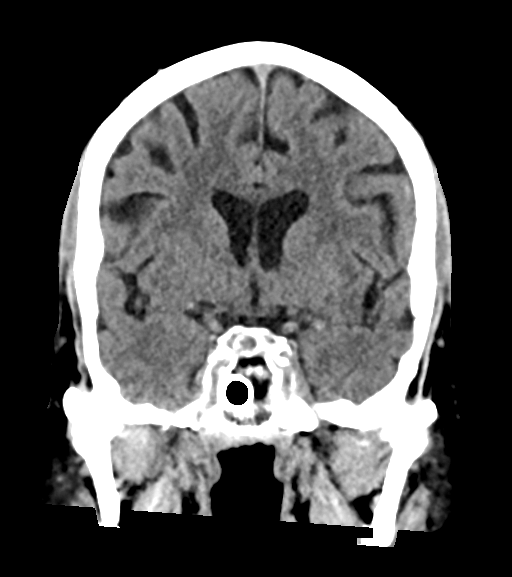

[Series 5: sag soft · sagittal · 0.37mm/px · 3 of 56 slices shown]
[im 19/56  brain]
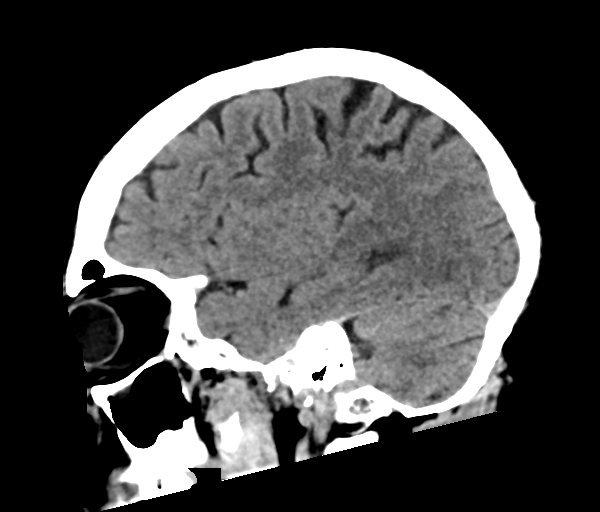
[im 28/56  brain]
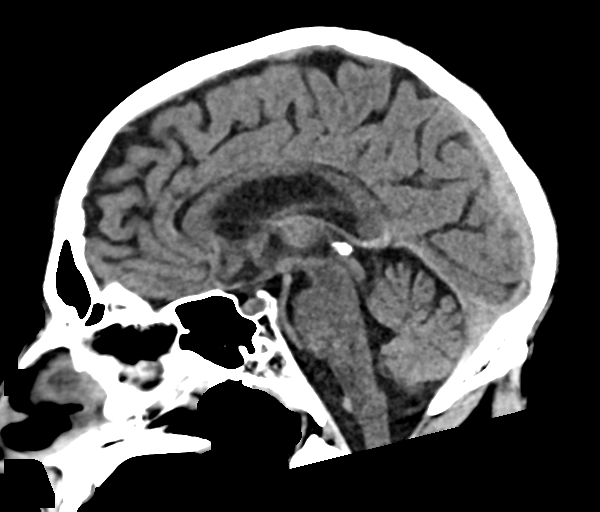
[im 37/56  brain]
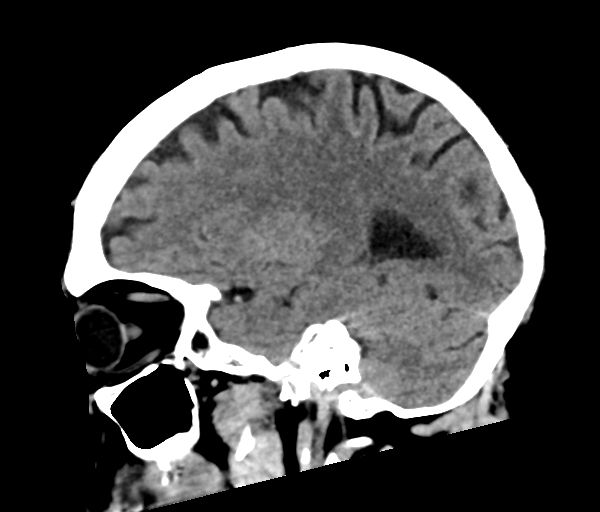

[17 of 47 positions shown; findings below may reference images not displayed]

FINDINGS: Brain: No acute intracranial abnormality. Specifically, no
hemorrhage, hydrocephalus, mass lesion, acute infarction, or
significant intracranial injury.

Vascular: No hyperdense vessel or unexpected calcification.

Skull: No acute calvarial abnormality.

Sinuses/Orbits: No acute findings

Other: None
IMPRESSION: No acute intracranial abnormality.

## 2021-08-29 IMAGING — DX DG CHEST 1V PORT
1 series · 1 of 1 positions shown · non-contrast
Comparison: [DATE]

CLINICAL DATA: Shortness of breath

EXAM:
PORTABLE CHEST 1 VIEW

[chest ap]
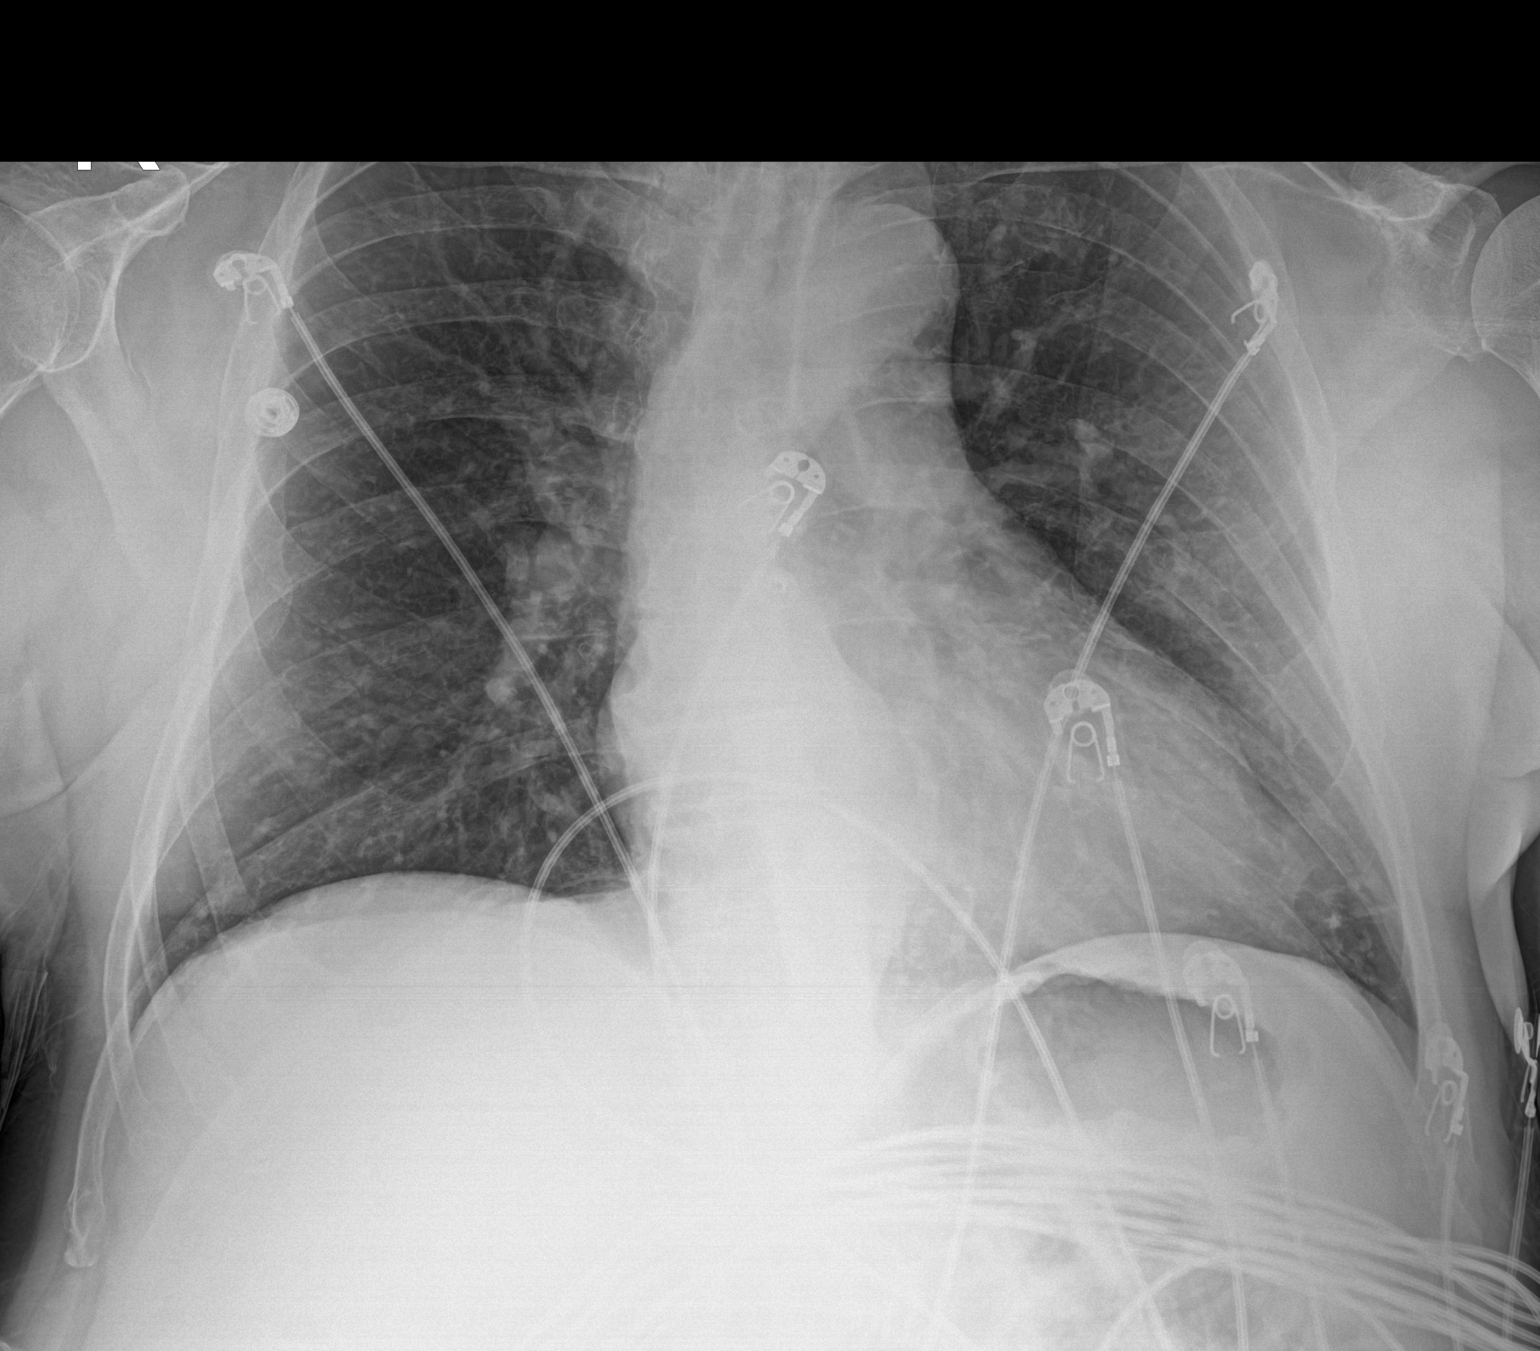

[1 of 1 positions shown; findings below may reference images not displayed]

FINDINGS: Lungs are clear.  No pleural effusion or pneumothorax.

The heart is normal in size.
IMPRESSION: No evidence of acute cardiopulmonary disease.

## 2021-08-29 IMAGING — CT CT CHEST-ABD-PELV W/ CM
3 of 5 series · 11 of 46 positions shown, 16 images · IV contrast (agent unspecified)
Comparison: None Available.

CLINICAL DATA: Sepsis

EXAM:
CT CHEST, ABDOMEN, AND PELVIS WITH CONTRAST
TECHNIQUE: Multidetector CT imaging of the chest, abdomen and pelvis was
performed following the standard protocol during bolus
administration of intravenous contrast.

[Series 3: cap with · axial · 0.79mm/px · z∈[+968,+1443]mm · 7 of 127 slices shown, 12 images]
[im 16/127  soft-tissue]
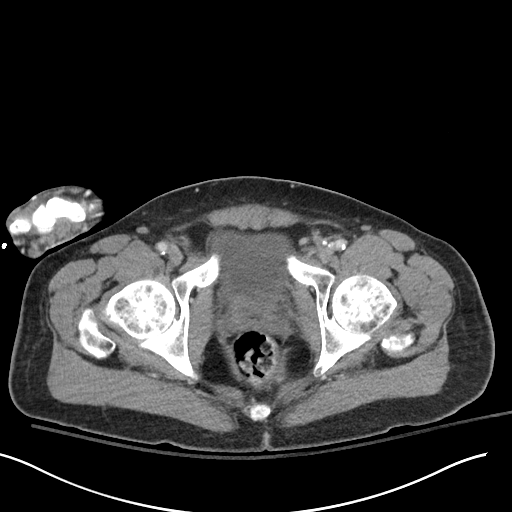
[im 16/127  bone]
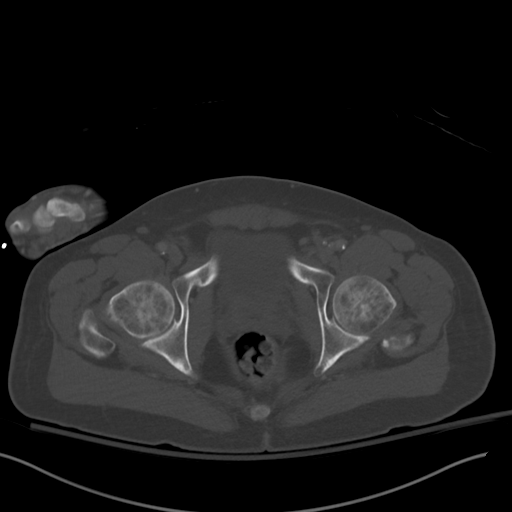
[im 32/127  soft-tissue]
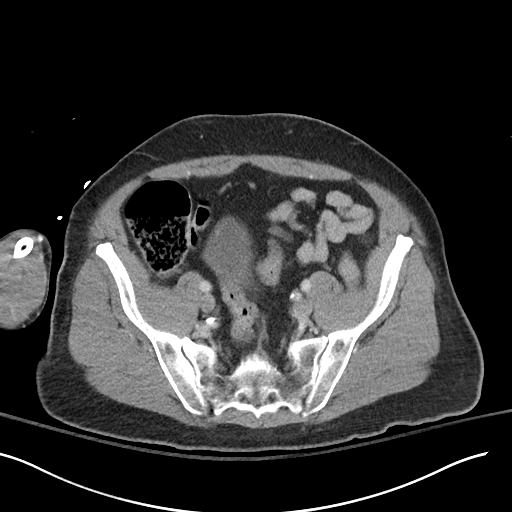
[im 48/127  soft-tissue]
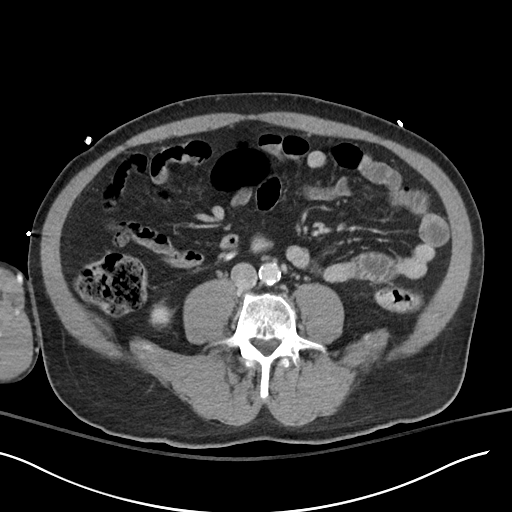
[im 64/127  soft-tissue]
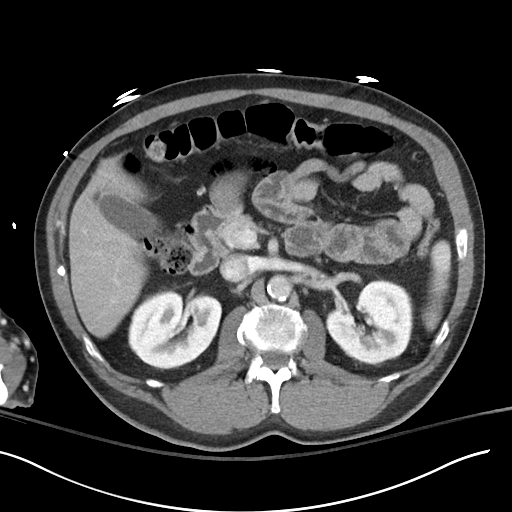
[im 64/127  lung]
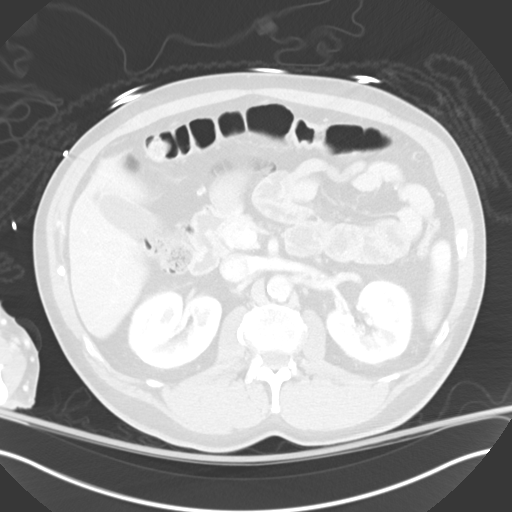
[im 79/127  soft-tissue]
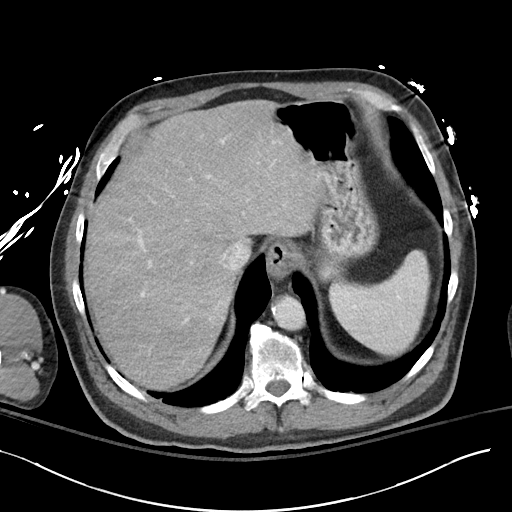
[im 79/127  lung]
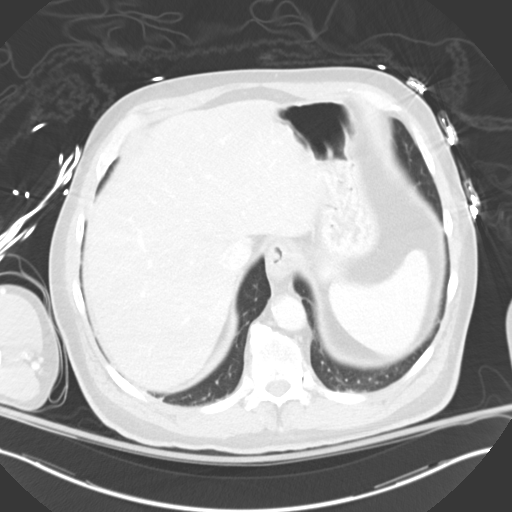
[im 95/127  soft-tissue]
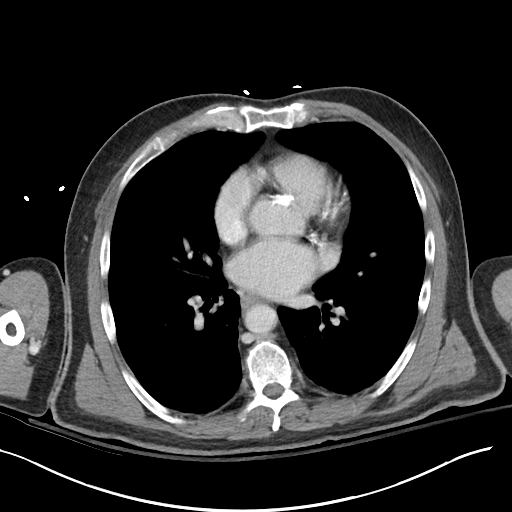
[im 95/127  lung]
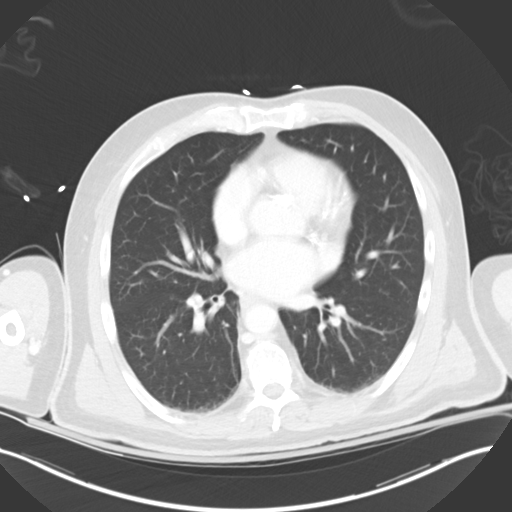
[im 111/127  soft-tissue]
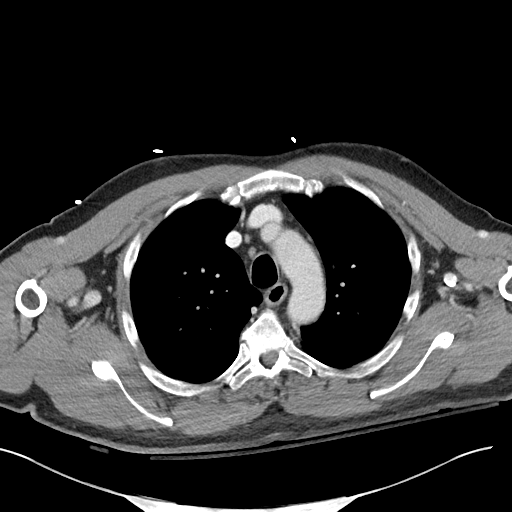
[im 111/127  lung]
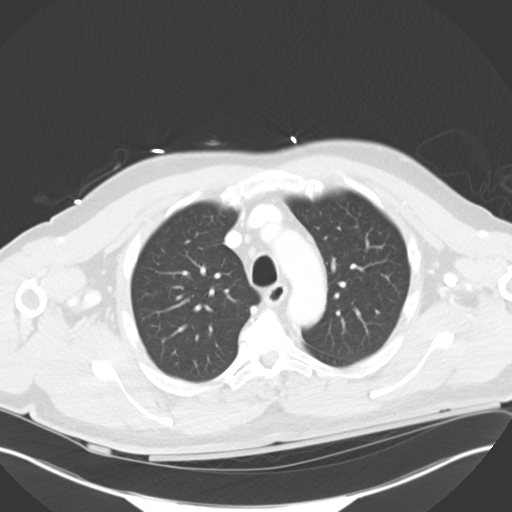

[Series 6: cor · coronal · 0.78mm/px · 3 of 105 slices shown]
[im 35/105  soft-tissue]
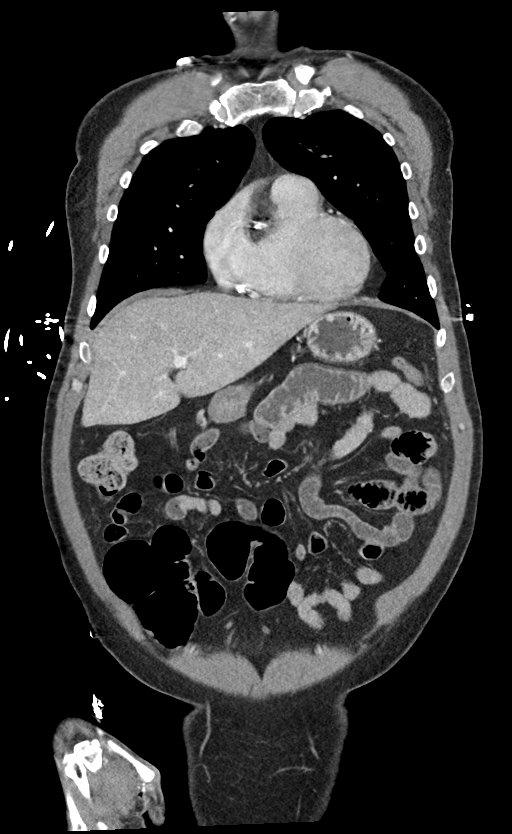
[im 47/105  soft-tissue]
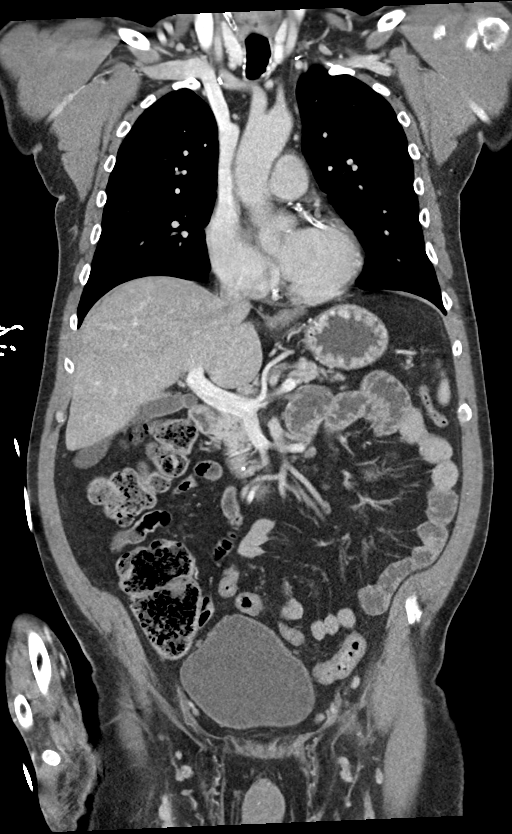
[im 58/105  soft-tissue]
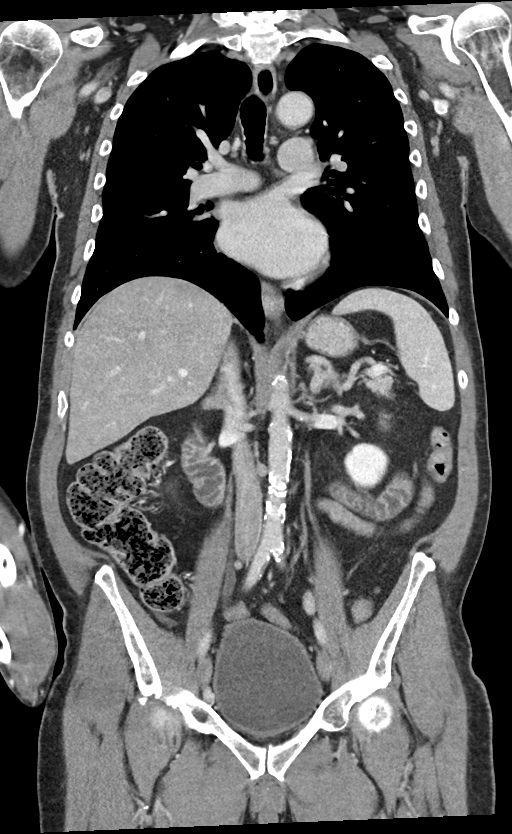

[Series 7: sag · sagittal · 0.61mm/px · 1 of 134 slices shown]
[im 45/134  soft-tissue]
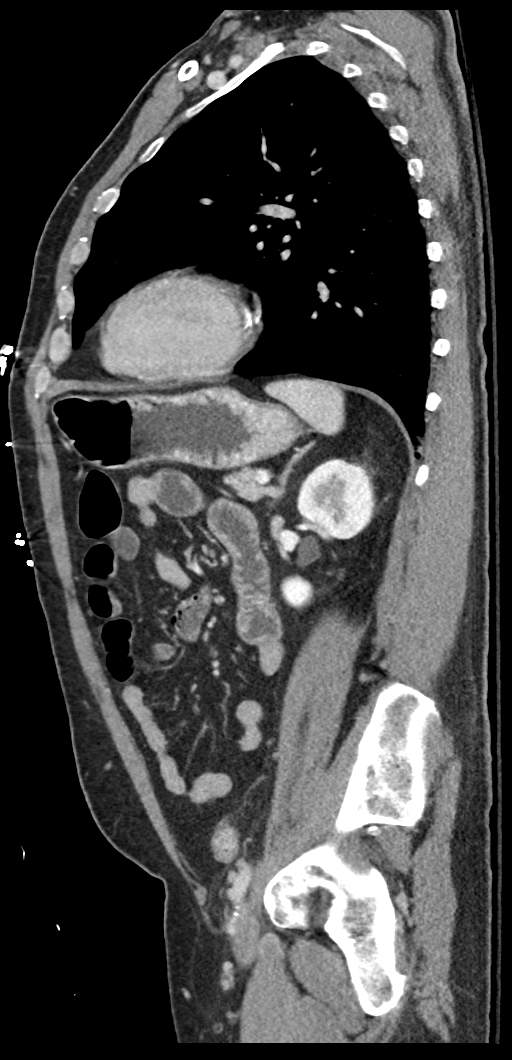

[11 of 46 positions shown; findings below may reference images not displayed]

RADIATION DOSE REDUCTION: This exam was performed according to the
departmental dose-optimization program which includes automated
exposure control, adjustment of the mA and/or kV according to
patient size and/or use of iterative reconstruction technique.

CONTRAST:  100mL OMNIPAQUE IOHEXOL 300 MG/ML  SOLN
FINDINGS: CT CHEST FINDINGS

Cardiovascular: Diffuse coronary artery calcifications. Scattered
aortic calcifications. Heart is normal size. Aorta is normal
caliber.

Mediastinum/Nodes: No mediastinal, hilar, or axillary adenopathy.
Trachea and esophagus are unremarkable. Thyroid unremarkable.

Lungs/Pleura: Lungs are clear. No focal airspace opacities or
suspicious nodules. No effusions.

Musculoskeletal: Chest wall soft tissues are unremarkable. No acute
bony abnormality.

CT ABDOMEN PELVIS FINDINGS

Hepatobiliary: No focal hepatic abnormality. Gallbladder
unremarkable.

Pancreas: No focal abnormality or ductal dilatation.

Spleen: No focal abnormality.  Normal size.

Adrenals/Urinary Tract: No adrenal abnormality. No focal renal
abnormality. No stones or hydronephrosis. Urinary bladder is
unremarkable.

Stomach/Bowel: Stomach, large and small bowel grossly unremarkable.

Vascular/Lymphatic: Aortoiliac atherosclerosis. No evidence of
aneurysm or adenopathy.

Reproductive: No visible focal abnormality.

Other: No free fluid or free air.

Musculoskeletal: No acute bony abnormality.
IMPRESSION: No acute findings in the chest, abdomen or pelvis.

Diffuse coronary artery disease.  Aortic atherosclerosis.

## 2021-08-29 MED ORDER — METRONIDAZOLE 500 MG/100ML IV SOLN
500.0000 mg | Freq: Two times a day (BID) | INTRAVENOUS | Status: DC
  Administered 2021-08-30 – 2021-08-31 (×3): 500 mg via INTRAVENOUS
  Filled 2021-08-29 (×3): qty 100

## 2021-08-29 MED ORDER — INSULIN ASPART 100 UNIT/ML IJ SOLN
0.0000 [IU] | INTRAMUSCULAR | Status: DC
  Administered 2021-08-30: 7 [IU] via SUBCUTANEOUS
  Administered 2021-08-30: 5 [IU] via SUBCUTANEOUS
  Administered 2021-08-30: 7 [IU] via SUBCUTANEOUS

## 2021-08-29 MED ORDER — VANCOMYCIN HCL 1750 MG/350ML IV SOLN
1750.0000 mg | Freq: Once | INTRAVENOUS | Status: AC
  Administered 2021-08-30: 1750 mg via INTRAVENOUS
  Filled 2021-08-29: qty 350

## 2021-08-29 MED ORDER — SODIUM CHLORIDE 0.9 % IV SOLN
2.0000 g | Freq: Once | INTRAVENOUS | Status: AC
  Administered 2021-08-29: 2 g via INTRAVENOUS
  Filled 2021-08-29: qty 12.5

## 2021-08-29 MED ORDER — MAGNESIUM SULFATE 2 GM/50ML IV SOLN
2.0000 g | Freq: Once | INTRAVENOUS | Status: AC
  Administered 2021-08-29: 2 g via INTRAVENOUS
  Filled 2021-08-29: qty 50

## 2021-08-29 MED ORDER — VANCOMYCIN HCL IN DEXTROSE 1-5 GM/200ML-% IV SOLN: 1000.0000 mg | Freq: Once | INTRAVENOUS | Status: DC

## 2021-08-29 MED ORDER — IOHEXOL 300 MG/ML  SOLN
100.0000 mL | Freq: Once | INTRAMUSCULAR | Status: AC | PRN
  Administered 2021-08-29: 100 mL via INTRAVENOUS

## 2021-08-29 MED ORDER — ACETAMINOPHEN 500 MG PO TABS
1000.0000 mg | ORAL_TABLET | Freq: Four times a day (QID) | ORAL | Status: DC | PRN
  Administered 2021-08-29 – 2021-09-04 (×3): 1000 mg via ORAL
  Administered 2021-09-05: 650 mg via ORAL
  Administered 2021-09-06: 1000 mg via ORAL
  Filled 2021-08-29 (×4): qty 2

## 2021-08-29 MED ORDER — METRONIDAZOLE 500 MG/100ML IV SOLN
500.0000 mg | Freq: Once | INTRAVENOUS | Status: AC
  Administered 2021-08-30: 500 mg via INTRAVENOUS
  Filled 2021-08-29: qty 100

## 2021-08-29 NOTE — Assessment & Plan Note (Signed)
-   currently appears to be slightly on the dry side, hold home diuretics for tonight and restart when appears euvolemic, carefuly follow fluid status and Cr  

## 2021-08-29 NOTE — ED Triage Notes (Signed)
Here for dizziness, sob and diaphoresis. Afib RVR on the monitor per EMS. Cardizem 20mg  IVP given and 500cc NS bolus. Alert and oriented x 4. Hx of cardioversion in the past.

## 2021-08-29 NOTE — H&P (Signed)
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 prophylaxis:  eliquis    Code Status:    Code Status: Prior FULL CODE  as per patient  family  I had personally discussed CODE STATUS with patient     Family Communication:   Family not at  Bedside    Disposition Plan:      To home once workup is complete and patient is stable   Following barriers for discharge:                            Electrolytes corrected                                Afebrile, white count improving able to transition to PO antibiotics                        Diabetes care coordinator              Consults called: none  Admission status:  ED Disposition     ED  Disposition  Admit   Condition  --   [employer]: [place] [[id number]]  Level of Care: Progressive [102]  Admit to  Progressive based on following criteria: MULTISYSTEM THREATS such as stable sepsis, metabolic/electrolyte imbalance with or without encephalopathy that is responding to early treatment.  May place patient in observation at [facility] or [place] if equivalent level of care is available:: No  Covid Evaluation: Confirmed COVID Negative  Diagnosis: Sepsis [facility]) [[id number]]  Admitting Physician: Toy Baker [[id number]]  Attending Physician: Toy Baker [[id number]]           Obs       Level of care   progressive tele indefinitely please discontinue once patient no longer qualifies COVID-19 Labs    Lab Results  Component Value Date   [place] 08/29/2021     Precautions: admitted as   Covid Negative         David [name] 08/30/2021, 12:14 AM    [facility]     after 2 AM please page floor coverage PA If 7AM-7PM, please contact the day team taking care of the patient using [email or web address]   Patient was evaluated in the context of the global COVID-19 pandemic, which necessitated consideration that the patient might be at risk for infection with the SARS-CoV-2 virus that causes COVID-19. Institutional protocols and algorithms that pertain to the evaluation of patients at risk for COVID-19 are in a state of rapid change based on information released by regulatory bodies including the CDC and federal and state organizations. These policies and algorithms were followed during the patient's care.

## 2021-08-29 NOTE — Assessment & Plan Note (Signed)
-   Order Sensitive  - continue home insulin regimen   -  check TSH and HgA1C  - Hold by mouth medications*

## 2021-08-29 NOTE — Assessment & Plan Note (Signed)
Continue Eliquis restart Coreg when blood pressure allows check digoxin level and restart as able

## 2021-08-29 NOTE — Assessment & Plan Note (Signed)
Check urine electrolytes rehydrate follow fluid status

## 2021-08-29 NOTE — ED Provider Notes (Signed)
[place] Provider Note   CSN: [id number] Arrival date & time: 08/29/21  1722     History  Chief Complaint  Patient presents with  . Afib RVR    David [name] is a 61 y.o. male.  HPI Patient is a 61 year old male with a history of HFrEF, hypertension, hyperlipidemia, insulin-dependent type 2 diabetes mellitus, chronic atrial fibrillation on Eliquis, who presents to the emergency department due to palpitations and shortness of breath.  States his symptoms started around noon today.  They persisted for about 4 hours before he called EMS.  Reports associated lightheadedness as well as diaphoresis.  EMS states that they found him pale and diaphoretic.  He ECG showed atrial fibrillation with RVR.  He was given a 20 mg bolus of Cardizem and 500 cc of normal saline and his rate improved to about 100 bpm.  Patient now reports a mild headache but otherwise denies any somatic complaints.  States that he has not taken any of his regular medications today due to his symptoms.  Denies any drug use or alcohol use.  States that he has been working more recently and due to this has been drinking more caffeine than normal.  Echocardiogram on October 17, 2018 shows an LVEF of 35% to 40%.    Home Medications Prior to Admission medications   Medication Sig Start Date End Date Taking? Authorizing Provider  apixaban (ELIQUIS) 5 MG TABS tablet Take 1 tablet (5 mg total) by mouth 2 (two) times daily. 05/10/21   [name] T, PA-C  carvedilol (COREG) 25 MG tablet Take 1 tablet (25 mg total) by mouth 2 (two) times daily. 07/26/21 09/01/21  Charlott Rakes, MD  digoxin (LANOXIN) 0.125 MG tablet Take 1 tablet (125 mcg total) by mouth daily. 07/11/21 10/09/21  Charlott Rakes, MD  fluticasone (FLONASE) 50 MCG/ACT nasal spray Place 2 sprays into both nostrils daily. 01/08/21   Gildardo Pounds, NP  furosemide (LASIX) 40 MG tablet Take 1 tablet (40 mg total) by mouth daily. 07/26/21  09/01/21  Charlott Rakes, MD  insulin aspart (NOVOLOG) 100 UNIT/ML FlexPen Inject 6 Units into the skin in the morning, at noon, in the evening, and at bedtime. 01/08/21 08/27/21  Gildardo Pounds, NP  Insulin Glargine [facility] KWIKPEN) 100 UNIT/ML Inject 20 Units into the skin daily. 08/04/21   Charlott Rakes, MD  sacubitril-valsartan (ENTRESTO) 49-51 MG Take 1 tablet by mouth 2 (two) times daily. 07/14/21   Gildardo Pounds, NP  spironolactone (ALDACTONE) 25 MG tablet Take 1 tablet (25 mg total) by mouth daily. 06/25/21 09/23/21  [name] T, PA-C  TRUEplus Lancets 28G MISC Use as instructed. Check blood glucose level by fingerstick 3 times per day. 01/08/21   Gildardo Pounds, NP     Allergies    Patient has no known allergies.    Review of Systems   Review of Systems  All other systems reviewed and are negative. Ten systems reviewed and are negative for acute change, except as noted in the HPI.   Physical Exam Updated Vital Signs BP 128/75   Pulse (!) 103   Temp (!) 101.6 F (38.7 C) (Rectal)   Resp (!) 35   Ht 6' (1.829 m)   Wt 83.9 kg   SpO2 100%   BMI 25.09 kg/m  Physical Exam Vitals and nursing note reviewed.  Constitutional:      General: He is not in acute distress.    Appearance: Normal appearance. He is  not ill-appearing, toxic-appearing or diaphoretic.  HENT:     Head: Normocephalic and atraumatic.     Right Ear: External ear normal.     Left Ear: External ear normal.     Nose: Nose normal.     Mouth/Throat:     Mouth: Mucous membranes are moist.     Pharynx: Oropharynx is clear. No oropharyngeal exudate or posterior oropharyngeal erythema.  Eyes:     General: No scleral icterus.       Right eye: No discharge.        Left eye: No discharge.     Extraocular Movements: Extraocular movements intact.     Conjunctiva/sclera: Conjunctivae normal.  Cardiovascular:     Rate and Rhythm: Tachycardia present. Rhythm irregular.     Pulses: Normal pulses.     Heart  sounds: Normal heart sounds. No murmur heard.   No friction rub. No gallop.     Comments: Irregularly irregular.  Tachycardic around 105 bpm. Pulmonary:     Effort: Pulmonary effort is normal. No respiratory distress.     Breath sounds: Normal breath sounds. No stridor. No wheezing, rhonchi or rales.  Abdominal:     General: Abdomen is flat.     Palpations: Abdomen is soft.     Tenderness: There is no abdominal tenderness.  Musculoskeletal:        General: Normal range of motion.     Cervical back: Normal range of motion and neck supple. No tenderness.  Skin:    General: Skin is warm and dry.  Neurological:     General: No focal deficit present.     Mental Status: He is alert and oriented to person, place, and time.  Psychiatric:        Mood and Affect: Mood normal.        Behavior: Behavior normal.   ED Results / Procedures / Treatments   Labs (all labs ordered are listed, but only abnormal results are displayed) Labs Reviewed  COMPREHENSIVE METABOLIC PANEL - Abnormal; Notable for the following components:      Result Value   Sodium 125 (*)    Chloride 93 (*)    CO2 21 (*)    Glucose, Bld 284 (*)    BUN 22 (*)    Creatinine, Ser 1.27 (*)    Albumin 3.3 (*)    Total Bilirubin 1.8 (*)    All other components within normal limits  CBC WITH DIFFERENTIAL/PLATELET - Abnormal; Notable for the following components:   WBC 18.7 (*)    Neutro Abs 16.6 (*)    Monocytes Absolute 1.1 (*)    Abs Immature Granulocytes 0.17 (*)    All other components within normal limits  MAGNESIUM - Abnormal; Notable for the following components:   Magnesium 1.6 (*)    All other components within normal limits  URINALYSIS, ROUTINE W REFLEX MICROSCOPIC - Abnormal; Notable for the following components:   Color, Urine STRAW (*)    Glucose, UA >=500 (*)    Protein, ur 100 (*)    All other components within normal limits  CBG MONITORING, ED - Abnormal; Notable for the following components:    Glucose-Capillary 313 (*)    All other components within normal limits  RESP PANEL BY RT-PCR (FLU A&B, COVID) [id number]  CULTURE, BLOOD (ROUTINE X 2)  CULTURE, BLOOD (ROUTINE X 2) W REFLEX TO ID PANEL  LACTIC ACID, PLASMA  LACTIC ACID, PLASMA   EKG EKG Interpretation  Date/Time:  Sunday Aug 29 2021 21:14:07 EDT Ventricular Rate:  106 PR Interval:    QRS Duration: 100 QT Interval:  329 QTC Calculation: 437 R Axis:   91 Text Interpretation: Atrial fibrillation nonspecific ST/T changes similar to 2020 Confirmed by Sherwood Gambler [phone]) on 08/29/2021 10:15:32 PM  Radiology CT HEAD WO CONTRAST (5MM)  Result Date: 08/29/2021 CLINICAL DATA:  Headache, chronic, new features or increased frequency EXAM: CT HEAD WITHOUT CONTRAST TECHNIQUE: Contiguous axial images were obtained from the base of the skull through the vertex without intravenous contrast. RADIATION DOSE REDUCTION: This exam was performed according to the departmental dose-optimization program which includes automated exposure control, adjustment of the mA and/or kV according to patient size and/or use of iterative reconstruction technique. COMPARISON:  None Available. FINDINGS: Brain: No acute intracranial abnormality. Specifically, no hemorrhage, hydrocephalus, mass lesion, acute infarction, or significant intracranial injury. Vascular: No hyperdense vessel or unexpected calcification. Skull: No acute calvarial abnormality. Sinuses/Orbits: No acute findings Other: None IMPRESSION: No acute intracranial abnormality. Electronically Signed   By: Rolm Baptise M.D.   On: 08/29/2021 22:28   CT CHEST ABDOMEN PELVIS W CONTRAST  Result Date: 08/29/2021 CLINICAL DATA:  Sepsis EXAM: CT CHEST, ABDOMEN, AND PELVIS WITH CONTRAST TECHNIQUE: Multidetector CT imaging of the chest, abdomen and pelvis was performed following the standard protocol during bolus administration of intravenous contrast. RADIATION DOSE REDUCTION: This exam was performed  according to the departmental dose-optimization program which includes automated exposure control, adjustment of the mA and/or kV according to patient size and/or use of iterative reconstruction technique. CONTRAST:  192mL OMNIPAQUE IOHEXOL 300 MG/ML  SOLN COMPARISON:  None Available. FINDINGS: CT CHEST FINDINGS Cardiovascular: Diffuse coronary artery calcifications. Scattered aortic calcifications. Heart is normal size. Aorta is normal caliber. Mediastinum/Nodes: No mediastinal, hilar, or axillary adenopathy. Trachea and esophagus are unremarkable. Thyroid unremarkable. Lungs/Pleura: Lungs are clear. No focal airspace opacities or suspicious nodules. No effusions. Musculoskeletal: Chest wall soft tissues are unremarkable. No acute bony abnormality. CT ABDOMEN PELVIS FINDINGS Hepatobiliary: No focal hepatic abnormality. Gallbladder unremarkable. Pancreas: No focal abnormality or ductal dilatation. Spleen: No focal abnormality.  Normal size. Adrenals/Urinary Tract: No adrenal abnormality. No focal renal abnormality. No stones or hydronephrosis. Urinary bladder is unremarkable. Stomach/Bowel: Stomach, large and small bowel grossly unremarkable. Vascular/Lymphatic: Aortoiliac atherosclerosis. No evidence of aneurysm or adenopathy. Reproductive: No visible focal abnormality. Other: No free fluid or free air. Musculoskeletal: No acute bony abnormality. IMPRESSION: No acute findings in the chest, abdomen or pelvis. Diffuse coronary artery disease.  Aortic atherosclerosis. Electronically Signed   By: Rolm Baptise M.D.   On: 08/29/2021 22:31   DG Chest Portable 1 View  Result Date: 08/29/2021 CLINICAL DATA:  Shortness of breath EXAM: PORTABLE CHEST 1 VIEW COMPARISON:  10/17/2018 FINDINGS: Lungs are clear.  No pleural effusion or pneumothorax. The heart is normal in size. IMPRESSION: No evidence of acute cardiopulmonary disease. Electronically Signed   By: Julian Hy M.D.   On: 08/29/2021 18:31     Procedures Procedures   Medications Ordered in ED Medications  acetaminophen (TYLENOL) tablet 1,000 mg (1,000 mg Oral Given 08/29/21 1951)  ceFEPIme (MAXIPIME) 2 g in sodium chloride 0.9 % 100 mL IVPB (has no administration in time range)  metroNIDAZOLE (FLAGYL) IVPB 500 mg (has no administration in time range)  vancomycin (VANCOCIN) IVPB 1000 mg/200 mL premix (has no administration in time range)  magnesium sulfate IVPB 2 g 50 mL (0 g Intravenous Stopped 08/29/21 2037)  iohexol (OMNIPAQUE) 300 MG/ML solution 100 mL (100 mLs Intravenous Contrast  Given 08/29/21 2203)    ED Course/ Medical Decision Making/ A&P Clinical Course as of 08/29/21 2253  Sun Aug 29, 2021  1908 Sodium(!): 125 Pseudohyponatremia that is corrected to be 128. [LJ]  1928 Patient reassessed.  Reports headache as well as worsening chills.  Still denies any other complaints.  Oxygen saturations periodically dropping into the low to mid 80s but will then immediately come back to the high 90s on room air.  Patient discussed with my attending physician Dr. Sherwood Gambler.  We will obtain a rectal temp as well as UA and respiratory panel. [LJ]  1928 Temp(!): 101.8 F (38.8 C) [LJ]  1929 Rectal temperature found to be elevated at 101.8 F.  We will add on additional blood cultures as well as lactic acid.  Given patient's history of HFpEF will not initiate additional fluids at this time.  Tylenol for fever.  We will continue to closely monitor. [LJ]  2133 Patient reassessed.  Tachycardic around 120 bpm.  Fatigued appearing.  Still complaining of a mild headache but states that it has mostly resolved.  UA does not appear infectious.  Respiratory panel is negative.  Unsure of the source of the patient's fever.  Will obtain a CT scan of the head given his headache as well as a CT scan of the chest, abdomen, and pelvis to further evaluate the patient for possible sepsis. [LJ]  2147 Temp(!): 101.6 F (38.7 C) [LJ]    Clinical Course  User Index [LJ] Rayna Sexton, PA-C                           Medical Decision Making Amount and/or Complexity of Data Reviewed Labs: ordered. Decision-making details documented in ED Course. Radiology: ordered.  Risk OTC drugs. Prescription drug management.   Pt is a 61 y.o. male who initially presented to the emergency department and atrial fibrillation with RVR.  History of chronic atrial fibrillation on Eliquis.  After multiple reassessments began exhibiting chills and headache and a rectal temperature was obtained which was elevated at 101.8 F.  We then obtained a lactic acid and blood cultures.    Labs: CBC with a white count of 18.7, neutrophils 16.6, monocytes 1.1, absolute immature granulocytes of 0.17. CMP with a sodium of 125, chloride of 93, CO2 21, glucose of 284, BUN of 2.2, creatinine 1.27, albumin of 3.3, total bilirubin of 1.8. Magnesium 1.6. UA with greater than 500 glucose, 100 protein. Respiratory panel is negative. Lactic acid 1.4. Blood cultures obtained.  Imaging: Chest x-ray shows no evidence of acute cardiopulmonary disease. CT scan of the head without contrast shows no acute intracranial abnormality. CT scan of the chest, abdomen, and pelvis shows no acute findings in the chest, abdomen, or pelvis.  I, Rayna Sexton, PA-C, personally reviewed and evaluated these images and lab results as part of my medical decision-making.  Unsure the source of the patient's fever.  Tachycardia initially improved to about 105 beats per minutes upon arrival.  RVR had resolved.  Heart rate is now fluctuating between 115 to 125 bpm.  Patient reports headache, chills, and weakness but otherwise denies any somatic complaints.  No regions of cellulitis noted on my exam.  Extensive imaging obtained as noted above which appears reassuring.  UA does not appear infectious.  Respiratory panel is negative.  Unsure the source of the patient's fever.  Patient without nuchal rigidity  or signs of meningismus.  Given that he is anticoagulated on  Eliquis will not be able to perform an LP.  Given patient's presentation, complex medical history, feel that he would benefit from admission for antibiotics and close monitoring.  This was discussed my attending physician who is in agreement.  This was discussed with the patient and he is amenable.  We will discuss with the medicine team at this time.  Note: Portions of this report may have been transcribed using voice recognition software. Every effort was made to ensure accuracy; however, inadvertent computerized transcription errors may be present.   Final Clinical Impression(s) / ED Diagnoses Final diagnoses:  Atrial fibrillation with RVR (HCC)  Hypomagnesemia  Hyponatremia  Fever of unknown origin   Rx / DC Orders ED Discharge Orders     None         Rayna Sexton, PA-C 08/29/21 2253    Sherwood Gambler, MD 08/31/21 [phone]

## 2021-08-29 NOTE — Assessment & Plan Note (Signed)
ER replace will recheck in a.m. check phosphate

## 2021-08-29 NOTE — Progress Notes (Addendum)
Pharmacy Antibiotic Note  David [name] is a 61 y.o. male admitted on 08/29/2021 with cellulitis.  Pharmacy has been consulted for vancomycin and cefepime dosing.  Plan: Vancomycin 1750mg  IV x1 then 750mg  IV Q12H. Goal AUC 400-550.  Expected AUC 405.  Cefepime 2g IV Q8H.  Height: 6' (182.9 cm) Weight: 83.9 kg (185 lb) IBW/kg (Calculated) : 77.6  Temp (24hrs), Avg:100.5 F (38.1 C), Min:98 F (36.7 C), Max:101.8 F (38.8 C)  Recent Labs  Lab 08/29/21 1732 08/29/21 2005 08/29/21 2230  WBC 18.7*  --   --   CREATININE 1.27*  --   --   LATICACIDVEN  --  1.4 1.2    Estimated Creatinine Clearance: 68.7 mL/min (A) (by C-G formula based on SCr of 1.27 mg/dL (H)).    No Known Allergies   Thank you for allowing pharmacy to be a part of this patient's care.  [name], PharmD, BCPS  08/29/2021 11:55 PM

## 2021-08-29 NOTE — Assessment & Plan Note (Signed)
Allow permissive hypertension for tonight 

## 2021-08-29 NOTE — Assessment & Plan Note (Signed)
-  SIRS criteria met with  elevated white blood cell count,       Component Value Date/Time   WBC 18.7 (H) 08/29/2021 1732   LYMPHSABS 0.8 08/29/2021 1732   LYMPHSABS 1.7 11/16/2018 1128    tachycardia   , fever  RR >20 Today's Vitals   08/29/21 2129 08/29/21 2141 08/29/21 2141 08/29/21 2245  BP: (!) 145/73   128/75  Pulse: (!) 108   (!) 103  Resp: (!) 29   (!) 35  Temp:   (!) 101.6 F (38.7 C)   TempSrc:   Rectal   SpO2: 98%   100%  Weight:      Height:      PainSc:  7       The recent clinical data is shown below. Vitals:   08/29/21 1945 08/29/21 2129 08/29/21 2141 08/29/21 2245  BP: (!) 151/103 (!) 145/73  128/75  Pulse: (!) 116 (!) 108  (!) 103  Resp: (!) 30 (!) 29  (!) 35  Temp:   (!) 101.6 F (38.7 C)   TempSrc:   Rectal   SpO2: 98% 98%  100%  Weight:      Height:          Source of sepsis is unknown but given clinical picture will continue to treat     - Obtain serial lactic acid and procalcitonin level.  - Initiated IV antibiotics in ER: Antibiotics Given (last 72 hours)    Date/Time Action Medication Dose Rate   08/29/21 2304 New Bag/Given   ceFEPIme (MAXIPIME) 2 g in sodium chloride 0.9 % 100 mL IVPB 2 g 200 mL/hr      Will continue  on : cefepime, vanc flagyl   - await results of blood and urine culture  - Rehydrate aggressively  Intravenous fluids were administered    11:17 PM

## 2021-08-29 NOTE — Subjective & Objective (Signed)
Presents with palpitations and shortness of breath started around noon.  Has been going on for about 4 hours and called EMS.  Has been feeling lightheaded and diaphoretic. When EMS arrived they appeared pale and diaphoretic.  EKG showed A-fib with RVR.  He was given Cardizem 20 mg and 500 mg liters of normal saline with heart rate coming down to 100. Patient has not been taking his home medicines today because he was not feeling well. Denies alcohol or drug abuse. Has been drinking more caffeine than usual.  Patient has known history of A-fib on Eliquis and Coreg as well as digoxin

## 2021-08-30 ENCOUNTER — Encounter (HOSPITAL_COMMUNITY): Payer: Self-pay | Admitting: Internal Medicine

## 2021-08-30 ENCOUNTER — Observation Stay (HOSPITAL_COMMUNITY): Payer: Self-pay

## 2021-08-30 ENCOUNTER — Other Ambulatory Visit: Payer: Self-pay

## 2021-08-30 ENCOUNTER — Inpatient Hospital Stay (HOSPITAL_COMMUNITY): Payer: Self-pay

## 2021-08-30 DIAGNOSIS — L0390 Cellulitis, unspecified: Secondary | ICD-10-CM

## 2021-08-30 LAB — PREALBUMIN: Prealbumin: 16.6 mg/dL — ABNORMAL LOW (ref 18–38)

## 2021-08-30 LAB — RESPIRATORY PANEL BY PCR

## 2021-08-30 LAB — CBC WITH DIFFERENTIAL/PLATELET
Abs Immature Granulocytes: 0.08 10*3/uL — ABNORMAL HIGH (ref 0.00–0.07)
Basophils Absolute: 0.1 10*3/uL (ref 0.0–0.1)
Basophils Relative: 1 %
Eosinophils Absolute: 0 10*3/uL (ref 0.0–0.5)
Eosinophils Relative: 0 %
HCT: 43.8 % (ref 39.0–52.0)
Hemoglobin: 15.1 g/dL (ref 13.0–17.0)
Immature Granulocytes: 1 %
Lymphocytes Relative: 5 %
Lymphs Abs: 0.7 10*3/uL (ref 0.7–4.0)
MCH: 31.9 pg (ref 26.0–34.0)
MCHC: 34.5 g/dL (ref 30.0–36.0)
MCV: 92.6 fL (ref 80.0–100.0)
Monocytes Absolute: 0.7 10*3/uL (ref 0.1–1.0)
Monocytes Relative: 5 %
Neutro Abs: 12.3 10*3/uL — ABNORMAL HIGH (ref 1.7–7.7)
Neutrophils Relative %: 88 %
Platelets: 203 10*3/uL (ref 150–400)
RBC: 4.73 MIL/uL (ref 4.22–5.81)
RDW: 12.7 % (ref 11.5–15.5)
WBC: 13.9 10*3/uL — ABNORMAL HIGH (ref 4.0–10.5)
nRBC: 0 % (ref 0.0–0.2)

## 2021-08-30 LAB — BASIC METABOLIC PANEL
Anion gap: 10 (ref 5–15)
BUN: 19 mg/dL (ref 6–20)
CO2: 22 mmol/L (ref 22–32)
Calcium: 8.8 mg/dL — ABNORMAL LOW (ref 8.9–10.3)
Chloride: 96 mmol/L — ABNORMAL LOW (ref 98–111)
Creatinine, Ser: 1.3 mg/dL — ABNORMAL HIGH (ref 0.61–1.24)
GFR, Estimated: >60 mL/min (ref >60)
Glucose, Bld: 268 mg/dL — ABNORMAL HIGH (ref 70–99)
Potassium: 4 mmol/L (ref 3.5–5.1)
Sodium: 128 mmol/L — ABNORMAL LOW (ref 135–145)

## 2021-08-30 LAB — BLOOD GAS, VENOUS
Acid-Base Excess: 1.5 mmol/L (ref 0.0–2.0)
Bicarbonate: 25.9 mmol/L (ref 20.0–28.0)
Drawn by: 44525
O2 Saturation: 77.3 %
Patient temperature: 37
pCO2, Ven: 39 mmHg — ABNORMAL LOW (ref 44–60)
pH, Ven: 7.43 (ref 7.25–7.43)
pO2, Ven: 47 mmHg — ABNORMAL HIGH (ref 32–45)

## 2021-08-30 LAB — BLOOD CULTURE ID PANEL (REFLEXED) - BCID2

## 2021-08-30 LAB — PROTIME-INR
INR: 1.2 (ref 0.8–1.2)
Prothrombin Time: 14.8 seconds (ref 11.4–15.2)

## 2021-08-30 LAB — DIGOXIN LEVEL: Digoxin Level: <0.2 ng/mL — ABNORMAL LOW (ref 0.8–2.0)

## 2021-08-30 LAB — GLUCOSE, CAPILLARY
Glucose-Capillary: 321 mg/dL — ABNORMAL HIGH (ref 70–99)
Glucose-Capillary: 344 mg/dL — ABNORMAL HIGH (ref 70–99)
Glucose-Capillary: 318 mg/dL — ABNORMAL HIGH (ref 70–99)
Glucose-Capillary: 305 mg/dL — ABNORMAL HIGH (ref 70–99)
Glucose-Capillary: 323 mg/dL — ABNORMAL HIGH (ref 70–99)

## 2021-08-30 LAB — LACTATE DEHYDROGENASE: LDH: 142 U/L (ref 98–192)

## 2021-08-30 LAB — COMPREHENSIVE METABOLIC PANEL
ALT: 17 U/L (ref 0–44)
AST: 21 U/L (ref 15–41)
Albumin: 3.3 g/dL — ABNORMAL LOW (ref 3.5–5.0)
Alkaline Phosphatase: 87 U/L (ref 38–126)
Anion gap: 11 (ref 5–15)
BUN: 18 mg/dL (ref 6–20)
CO2: 20 mmol/L — ABNORMAL LOW (ref 22–32)
Calcium: 9.2 mg/dL (ref 8.9–10.3)
Chloride: 98 mmol/L (ref 98–111)
Creatinine, Ser: 1.3 mg/dL — ABNORMAL HIGH (ref 0.61–1.24)
GFR, Estimated: >60 mL/min (ref >60)
Glucose, Bld: 247 mg/dL — ABNORMAL HIGH (ref 70–99)
Potassium: 4.2 mmol/L (ref 3.5–5.1)
Sodium: 129 mmol/L — ABNORMAL LOW (ref 135–145)
Total Bilirubin: 1.2 mg/dL (ref 0.3–1.2)
Total Protein: 6.5 g/dL (ref 6.5–8.1)

## 2021-08-30 LAB — MAGNESIUM: Magnesium: 2 mg/dL (ref 1.7–2.4)

## 2021-08-30 LAB — URINE CULTURE: Culture: <10000 — AB

## 2021-08-30 LAB — TSH: TSH: 0.837 u[IU]/mL (ref 0.350–4.500)

## 2021-08-30 LAB — MRSA NEXT GEN BY PCR, NASAL: MRSA by PCR Next Gen: DETECTED — AB

## 2021-08-30 LAB — CBC
HCT: 47.1 % (ref 39.0–52.0)
Hemoglobin: 16.4 g/dL (ref 13.0–17.0)
MCH: 32.2 pg (ref 26.0–34.0)
MCHC: 34.8 g/dL (ref 30.0–36.0)
MCV: 92.4 fL (ref 80.0–100.0)
Platelets: 179 10*3/uL (ref 150–400)
RBC: 5.1 MIL/uL (ref 4.22–5.81)
RDW: 12.7 % (ref 11.5–15.5)
WBC: 12.3 10*3/uL — ABNORMAL HIGH (ref 4.0–10.5)
nRBC: 0 % (ref 0.0–0.2)

## 2021-08-30 LAB — HIV ANTIBODY (ROUTINE TESTING W REFLEX): HIV Screen 4th Generation wRfx: NONREACTIVE

## 2021-08-30 LAB — HEMOGLOBIN A1C
Hgb A1c MFr Bld: 11.2 % — ABNORMAL HIGH (ref 4.8–5.6)
Mean Plasma Glucose: 274.74 mg/dL

## 2021-08-30 LAB — SODIUM, URINE, RANDOM: Sodium, Ur: 25 mmol/L

## 2021-08-30 LAB — CK: Total CK: 22 U/L — ABNORMAL LOW (ref 49–397)

## 2021-08-30 LAB — PHOSPHORUS: Phosphorus: 2.6 mg/dL (ref 2.5–4.6)

## 2021-08-30 LAB — APTT: aPTT: 33 seconds (ref 24–36)

## 2021-08-30 LAB — CBG MONITORING, ED: Glucose-Capillary: 292 mg/dL — ABNORMAL HIGH (ref 70–99)

## 2021-08-30 LAB — PROCALCITONIN: Procalcitonin: 0.4 ng/mL

## 2021-08-30 LAB — OSMOLALITY: Osmolality: 290 mOsm/kg (ref 275–295)

## 2021-08-30 LAB — OSMOLALITY, URINE: Osmolality, Ur: 260 mOsm/kg — ABNORMAL LOW (ref 300–900)

## 2021-08-30 LAB — CREATININE, URINE, RANDOM: Creatinine, Urine: 38.98 mg/dL

## 2021-08-30 IMAGING — DX DG FOOT 2V*R*
2 series · 2 of 2 positions shown · non-contrast
Comparison: None Available.

CLINICAL DATA: Foot pain

EXAM:
RIGHT FOOT - 2 VIEW

[foot ap]
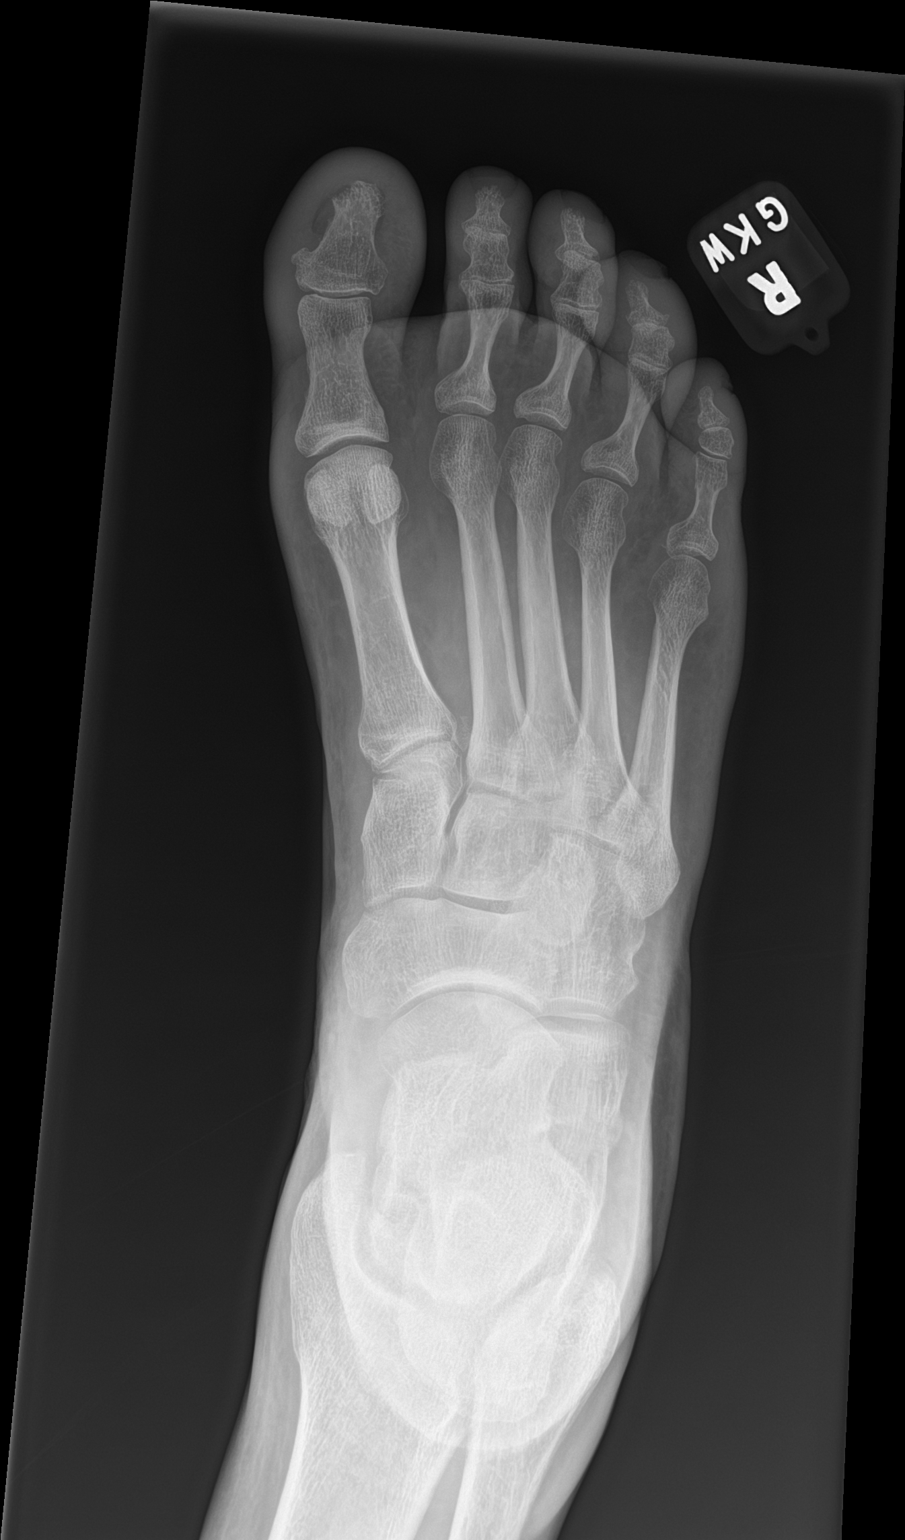

[foot lat]
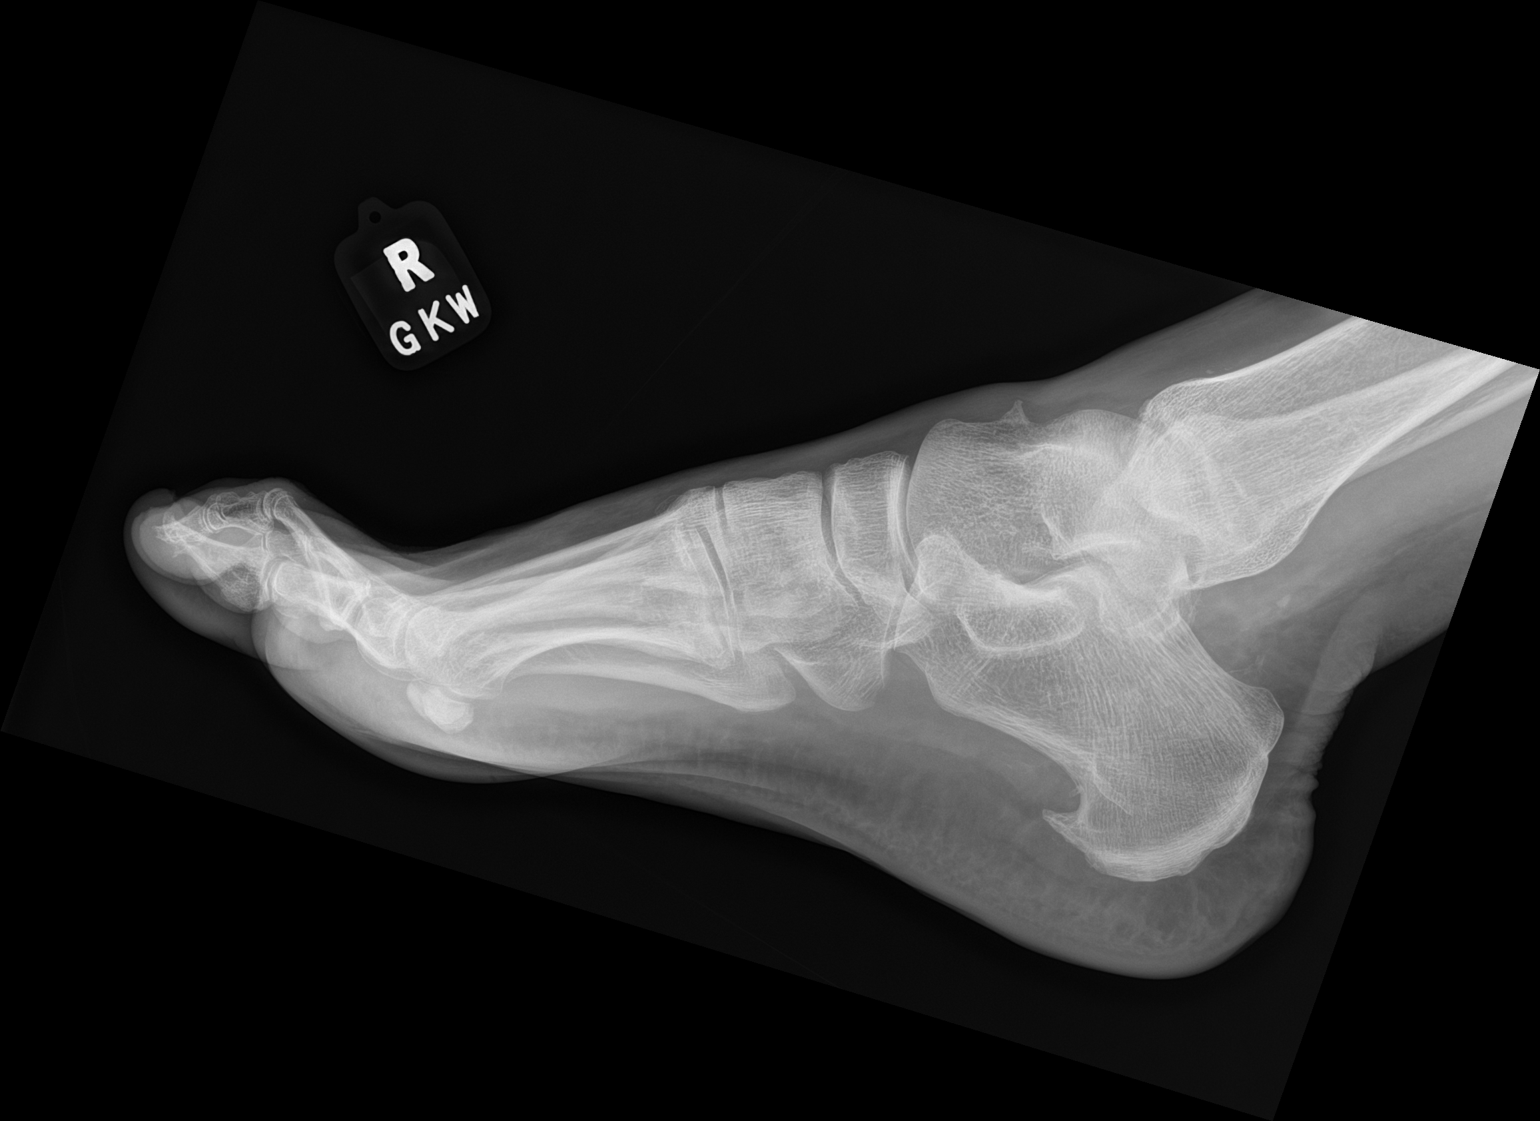

[2 of 2 positions shown; findings below may reference images not displayed]

FINDINGS: No fracture or dislocation is seen.

The joint spaces are preserved.

Visualized soft tissues are within normal limits.

Moderate plantar calcaneal enthesophyte.
IMPRESSION: Negative.

## 2021-08-30 MED ORDER — CARVEDILOL 3.125 MG PO TABS
3.1250 mg | ORAL_TABLET | Freq: Two times a day (BID) | ORAL | Status: DC
Start: 2021-08-30 — End: 2021-09-07
  Administered 2021-08-30 – 2021-09-06 (×13): 3.125 mg via ORAL
  Filled 2021-08-30 (×14): qty 1

## 2021-08-30 MED ORDER — INSULIN GLARGINE-YFGN 100 UNIT/ML ~~LOC~~ SOLN
16.0000 [IU] | Freq: Every day | SUBCUTANEOUS | Status: DC
Start: 2021-08-30 — End: 2021-08-30
  Filled 2021-08-30: qty 0.16

## 2021-08-30 MED ORDER — APIXABAN 5 MG PO TABS
5.0000 mg | ORAL_TABLET | Freq: Two times a day (BID) | ORAL | Status: DC
  Administered 2021-08-30 – 2021-08-31 (×4): 5 mg via ORAL
  Filled 2021-08-30 (×4): qty 1

## 2021-08-30 MED ORDER — SODIUM CHLORIDE 0.9 % IV SOLN
75.0000 mL/h | INTRAVENOUS | Status: AC
  Administered 2021-08-30: 75 mL/h via INTRAVENOUS

## 2021-08-30 MED ORDER — ACETAMINOPHEN 325 MG PO TABS
650.0000 mg | ORAL_TABLET | Freq: Four times a day (QID) | ORAL | Status: DC | PRN
  Administered 2021-08-30: 650 mg via ORAL
  Filled 2021-08-30 (×2): qty 2

## 2021-08-30 MED ORDER — LORATADINE 10 MG PO TABS
10.0000 mg | ORAL_TABLET | Freq: Every day | ORAL | Status: DC
  Administered 2021-08-30 – 2021-09-06 (×8): 10 mg via ORAL
  Filled 2021-08-30 (×8): qty 1

## 2021-08-30 MED ORDER — SODIUM CHLORIDE 0.9 % IV SOLN
2.0000 g | Freq: Three times a day (TID) | INTRAVENOUS | Status: DC
  Administered 2021-08-30 (×2): 2 g via INTRAVENOUS
  Filled 2021-08-30 (×2): qty 12.5

## 2021-08-30 MED ORDER — DIGOXIN 125 MCG PO TABS
125.0000 ug | ORAL_TABLET | Freq: Every day | ORAL | Status: DC
  Administered 2021-08-30 – 2021-09-06 (×8): 125 ug via ORAL
  Filled 2021-08-30 (×8): qty 1

## 2021-08-30 MED ORDER — VANCOMYCIN HCL 750 MG/150ML IV SOLN
750.0000 mg | Freq: Two times a day (BID) | INTRAVENOUS | Status: DC
  Administered 2021-08-30 – 2021-09-01 (×4): 750 mg via INTRAVENOUS
  Filled 2021-08-30 (×5): qty 150

## 2021-08-30 MED ORDER — INSULIN ASPART 100 UNIT/ML IJ SOLN
0.0000 [IU] | Freq: Every day | INTRAMUSCULAR | Status: DC
  Administered 2021-08-30: 4 [IU] via SUBCUTANEOUS
  Administered 2021-08-31: 3 [IU] via SUBCUTANEOUS
  Administered 2021-09-01: 2 [IU] via SUBCUTANEOUS
  Administered 2021-09-04 – 2021-09-05 (×2): 4 [IU] via SUBCUTANEOUS

## 2021-08-30 MED ORDER — INSULIN GLARGINE-YFGN 100 UNIT/ML ~~LOC~~ SOLN
25.0000 [IU] | Freq: Every day | SUBCUTANEOUS | Status: DC
  Administered 2021-08-30: 25 [IU] via SUBCUTANEOUS
  Filled 2021-08-30 (×2): qty 0.25

## 2021-08-30 MED ORDER — HYDROCODONE-ACETAMINOPHEN 5-325 MG PO TABS
1.0000 | ORAL_TABLET | ORAL | Status: DC | PRN
  Administered 2021-08-30 – 2021-08-31 (×3): 1 via ORAL
  Administered 2021-09-01 – 2021-09-03 (×8): 2 via ORAL
  Administered 2021-09-03: 1 via ORAL
  Administered 2021-09-04 – 2021-09-05 (×2): 2 via ORAL
  Filled 2021-08-30: qty 2
  Filled 2021-08-30: qty 1
  Filled 2021-08-30 (×2): qty 2
  Filled 2021-08-30: qty 1
  Filled 2021-08-30 (×8): qty 2
  Filled 2021-08-30: qty 1

## 2021-08-30 MED ORDER — ALBUTEROL SULFATE (2.5 MG/3ML) 0.083% IN NEBU: 2.5000 mg | INHALATION_SOLUTION | RESPIRATORY_TRACT | Status: DC | PRN

## 2021-08-30 MED ORDER — INSULIN ASPART 100 UNIT/ML IJ SOLN
0.0000 [IU] | Freq: Three times a day (TID) | INTRAMUSCULAR | Status: DC
  Administered 2021-08-30: 11 [IU] via SUBCUTANEOUS
  Administered 2021-08-31: 5 [IU] via SUBCUTANEOUS
  Administered 2021-08-31 (×2): 8 [IU] via SUBCUTANEOUS
  Administered 2021-09-01: 5 [IU] via SUBCUTANEOUS
  Administered 2021-09-01: 15 [IU] via SUBCUTANEOUS
  Administered 2021-09-01 – 2021-09-02 (×2): 3 [IU] via SUBCUTANEOUS
  Administered 2021-09-02: 8 [IU] via SUBCUTANEOUS
  Administered 2021-09-02: 2 [IU] via SUBCUTANEOUS
  Administered 2021-09-03 (×2): 5 [IU] via SUBCUTANEOUS
  Administered 2021-09-03: 2 [IU] via SUBCUTANEOUS
  Administered 2021-09-04 (×2): 3 [IU] via SUBCUTANEOUS
  Administered 2021-09-04: 11 [IU] via SUBCUTANEOUS
  Administered 2021-09-05: 5 [IU] via SUBCUTANEOUS
  Administered 2021-09-05: 8 [IU] via SUBCUTANEOUS
  Administered 2021-09-05: 5 [IU] via SUBCUTANEOUS
  Administered 2021-09-06: 3 [IU] via SUBCUTANEOUS
  Administered 2021-09-06: 5 [IU] via SUBCUTANEOUS

## 2021-08-30 MED ORDER — CARVEDILOL 25 MG PO TABS
25.0000 mg | ORAL_TABLET | Freq: Two times a day (BID) | ORAL | Status: DC
  Administered 2021-08-30: 25 mg via ORAL
  Filled 2021-08-30: qty 1

## 2021-08-30 MED ORDER — ACETAMINOPHEN 650 MG RE SUPP: 650.0000 mg | Freq: Four times a day (QID) | RECTAL | Status: DC | PRN

## 2021-08-30 MED ORDER — SALINE SPRAY 0.65 % NA SOLN
1.0000 | NASAL | Status: DC | PRN
  Filled 2021-08-30: qty 44

## 2021-08-30 MED ORDER — GLUCERNA SHAKE PO LIQD
237.0000 mL | Freq: Two times a day (BID) | ORAL | Status: DC
  Administered 2021-08-30 – 2021-09-05 (×9): 237 mL via ORAL

## 2021-08-30 NOTE — Progress Notes (Signed)
Initial Nutrition Assessment  DOCUMENTATION CODES:   Not applicable  INTERVENTION:   Glucerna Shake po TID, each supplement provides 220 kcal and 10 grams of protein. MVI with minerals daily. Diet education provided.   NUTRITION DIAGNOSIS:   Increased nutrient needs related to wound healing as evidenced by estimated needs.  GOAL:   Patient will meet greater than or equal to 90% of their needs  MONITOR:   PO intake, Supplement acceptance, Labs, Skin  REASON FOR ASSESSMENT:   Consult Assessment of nutrition requirement/status  ASSESSMENT:   61 yo male admitted with A fib with RVR, R toe ulcer, SIRS. PMH includes DM-2, chronic A fib, CHF, HTN, HLD, cardiomyopathy.  MRI and ABI ordered to R/O osteomyelitis and PVD.  Spoke with patient at bedside. 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 Discussed the importance of adequate protein intake to support healing. We also discussed how glycemic control plays a role in healing. He takes insulin at home and has been receiving insulin while in the hospital. May need to adjust insulin doses to help cover increased glucoses from acute stress and inflammation. Patient has had diet education in the past regarding heart health and diabetes. He has recently started taking beet root pills to help reduce inflammation and improve diabetes control.  Labs reviewed. Na 129 CBG: [id number]  Medications reviewed and include Novolog, Semglee, IV antibiotics.  Currently on a carb modified diet. Meal intakes: 100% at breakfast today  Weight history reviewed.  No significant weight changes noted. Patient reports intentional 60 lb weight loss over the past year, but this is not evident on review of usual weights. Noted 17 lb weight loss from 2020 to 2022, which is not a significant amount. Overall, weight has been stable.   NUTRITION - FOCUSED PHYSICAL  EXAM:  Flowsheet Row Most Recent Value  Orbital Region No depletion  Upper Arm Region No depletion  Thoracic and Lumbar Region No depletion  Buccal Region No depletion  Temple Region Mild depletion  Clavicle Bone Region Mild depletion  Clavicle and Acromion Bone Region No depletion  Scapular Bone Region No depletion  Dorsal Hand No depletion  Patellar Region Unable to assess  Anterior Thigh Region Unable to assess  Posterior Calf Region No depletion  Edema (RD Assessment) Mild  Hair Reviewed  Eyes Reviewed  Mouth Reviewed  Skin Reviewed  Nails Reviewed       Diet Order:   Diet Order             Diet Carb Modified Fluid consistency: Thin; Room service appropriate? Yes  Diet effective now                   EDUCATION NEEDS:   Education needs have been addressed  Skin:  Skin Assessment: Skin Integrity Issues: Skin Integrity Issues:: Diabetic Ulcer Diabetic Ulcer: 4th digit on R foot  Last BM:  no BM documented  Height:   Ht Readings from Last 1 Encounters:  08/30/21 6' (1.829 m)    Weight:   Wt Readings from Last 1 Encounters:  08/30/21 82 kg     BMI:  Body mass index is 24.52 kg/m.  Estimated Nutritional Needs:   Kcal:  2100-2300  Protein:  105-125 gm  Fluid:  2.1-2.3 L    Lucas Mallow RD, LDN, CNSC Please refer to Amion for contact information.

## 2021-08-30 NOTE — Progress Notes (Signed)
Mobility Specialist Progress Note    08/30/21 1159  Mobility  Activity Ambulated independently in hallway  Level of Assistance Standby assist, set-up cues, supervision of patient - no hands on  Assistive Device Other (Comment) (IV pole)  Distance Ambulated (ft) 470 ft  Activity Response Tolerated well  $Mobility charge 1 Mobility   Pre-Mobility: 89 HR, 117/82 BP, 99% SpO2 During Mobility: 84% SpO2 Post-Mobility: 86 HR, 100% SpO2  Pt received in bed and agreeable. Noticeably breathing in/out mouth because his nose was "stuffy". SpO2 did drop into mid 80s but pt had no complaints and recovered to 90s with a standing rest break and pursed lip breathing. Returned to bed with diabetes RN present. RN notified.   [place] Mobility Specialist  Primary: 5N M.S. Phone: [phone] Secondary: 6N M.S. Phone: [phone]

## 2021-08-30 NOTE — Progress Notes (Signed)
ABI completed. Refer to "CV Proc" under chart review to view preliminary results.  08/30/2021 2:25 PM Eula Fried., MHA, RVT, RDCS, RDMS

## 2021-08-30 NOTE — Progress Notes (Signed)
Patient arrived to unit. VSS; telemetry applied. CCMD notified. CHG completed. Patient oriented to room. Call bell in reach.  Patient given sandwich and water.

## 2021-08-30 NOTE — Progress Notes (Signed)
Inpatient Diabetes Program Recommendations  AACE/ADA: New Consensus Statement on Inpatient Glycemic Control (2015)  Target Ranges:  Prepandial:   less than 140 mg/dL      Peak postprandial:   less than 180 mg/dL (1-2 hours)      Critically ill patients:  140 - 180 mg/dL   Lab Results  Component Value Date   GLUCAP 318 (H) 08/30/2021   HGBA1C 11.2 (H) 08/30/2021    Review of Glycemic Control  Latest Reference Range & Units 08/29/21 17:37 08/30/21 00:33 08/30/21 03:36 08/30/21 08:50 08/30/21 11:06  Glucose-Capillary 70 - 99 mg/dL 035 (H) 597 (H) 416 (H) 344 (H) 318 (H)   Diabetes history: DM 2 Outpatient Diabetes medications: Basaglar 16 units bid, Novolog 10-12 units tid Current orders for Inpatient glycemic control:  Semglee 25 units qhs Novolog 0-15 units tid + hs  Glucerna bid between meals  A1c 11.2% on 5/29  Spoke with pt at bedside regarding A1c of 11.2% and glucose control at home. Pt reports going to the [facility] for Diabetes management approx. every 6 months. Pt reports that he checks his glucose 3 times a day and takes his insulin as prescribed. He states his fasting glucose trends are usually in the 150 range. His glucose trends increase after meal intake. Pt has a follow up appt on June 13th with his general PCP. Discussed with pt to possibly see the pharmacist at the [facility] for further adjustments in Diabetes insulin regimen. Pt reports following a low carbohydrate diet. Pt may need more meal coverage. Discussed importance of glucose control. Pt has all needed supplies at home.  Thanks,  Christena Deem RN, MSN, BC-ADM Inpatient Diabetes Coordinator Team Pager [phone] (8a-5p)
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
## 2021-08-30 NOTE — Plan of Care (Signed)
  Problem: Coping: Goal: Level of anxiety will decrease Outcome: Progressing   Problem: Health Behavior/Discharge Planning: Goal: Ability to manage health-related needs will improve Outcome: Not Progressing   Problem: Clinical Measurements: Goal: Will remain free from infection Outcome: Not Progressing

## 2021-08-30 NOTE — TOC Initial Note (Signed)
Transition of Care [facility]) - Initial/Assessment Note    Patient Details  Name: David [name] MRN: [id number] Date of Birth: 12/26/1960  Transition of Care [facility]) CM/SW Contact:    Durenda Guthrie, RN Phone Number: 08/30/2021, 8:58 AM  Clinical Narrative:    Transition of Care Screening Note:       Transition of Care Department [facility]) has reviewed patient and no TOC needs have been identified at this time. We will continue to monitor patient advancement through Interdisciplinary progressions. If new patient transition needs arise, please place a consult.                   Patient Goals and CMS Choice        Expected Discharge Plan and Services                                                Prior Living Arrangements/Services                       Activities of Daily Living Home Assistive Devices/Equipment: CBG Meter ADL Screening (condition at time of admission) Patient's cognitive ability adequate to safely complete daily activities?: Yes Is the patient deaf or have difficulty hearing?: No Does the patient have difficulty seeing, even when wearing glasses/contacts?: No Does the patient have difficulty concentrating, remembering, or making decisions?: No Patient able to express need for assistance with ADLs?: Yes Does the patient have difficulty dressing or bathing?: No Independently performs ADLs?: Yes (appropriate for developmental age) Does the patient have difficulty walking or climbing stairs?: No Weakness of Legs: None Weakness of Arms/Hands: None  Permission Sought/Granted                  Emotional Assessment              Admission diagnosis:  Hypomagnesemia [E83.42] Fever of unknown origin [R50.9] Hyponatremia [E87.1] Atrial fibrillation with RVR (HCC) [I48.91] Sepsis (HCC) [A41.9] Patient Active Problem List   Diagnosis Date Noted   Sepsis (HCC) 08/29/2021   SIRS (systemic inflammatory response syndrome) (HCC) 08/29/2021    Hyponatremia 08/29/2021   Hypomagnesemia 08/29/2021   Cellulitis in diabetic foot (HCC) 08/29/2021   Pure hypercholesterolemia 06/17/2021   Cellulitis 08/26/2019   Persistent atrial fibrillation (HCC) 08/26/2019   Chronic systolic CHF (congestive heart failure) (HCC) 10/18/2018   Transaminitis 10/17/2018   Hyperbilirubinemia 10/17/2018   Leukocytosis 10/17/2018   Sinusitis 10/17/2018   Chronic anticoagulation 09/30/2017   Smoker 09/30/2017   Cardiomyopathy (HCC) 09/30/2017   Sleep apnea suspected 09/30/2017   Essential hypertension 09/26/2017   Insulin dependent type 2 diabetes mellitus (HCC) 09/26/2017   PCP:  Claiborne Rigg, NP Pharmacy:   [facility] [address] [place] [address] Phone: [phone] Fax: [phone]  [facility] - [address] [place] [address] Phone: [phone] Fax: [phone]  [facility] [place], [place] - [address] [place] [address] Phone: [phone] Fax: [phone]     Social Determinants of Health (SDOH) Interventions    Readmission Risk Interventions    08/29/2019    9:50 AM  Readmission Risk Prevention Plan  Medication Screening Complete  Transportation Screening Complete
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
## 2021-08-31 ENCOUNTER — Inpatient Hospital Stay (HOSPITAL_COMMUNITY): Payer: Self-pay

## 2021-08-31 DIAGNOSIS — E1169 Type 2 diabetes mellitus with other specified complication: Secondary | ICD-10-CM

## 2021-08-31 DIAGNOSIS — Z794 Long term (current) use of insulin: Secondary | ICD-10-CM

## 2021-08-31 DIAGNOSIS — A4102 Sepsis due to Methicillin resistant Staphylococcus aureus: Secondary | ICD-10-CM

## 2021-08-31 DIAGNOSIS — R7881 Bacteremia: Secondary | ICD-10-CM

## 2021-08-31 DIAGNOSIS — M86171 Other acute osteomyelitis, right ankle and foot: Secondary | ICD-10-CM

## 2021-08-31 DIAGNOSIS — L03031 Cellulitis of right toe: Secondary | ICD-10-CM

## 2021-08-31 LAB — CBC
HCT: 41.7 % (ref 39.0–52.0)
Hemoglobin: 14.4 g/dL (ref 13.0–17.0)
MCH: 32.1 pg (ref 26.0–34.0)
MCHC: 34.5 g/dL (ref 30.0–36.0)
MCV: 93.1 fL (ref 80.0–100.0)
Platelets: 166 10*3/uL (ref 150–400)
RBC: 4.48 MIL/uL (ref 4.22–5.81)
RDW: 12.5 % (ref 11.5–15.5)
WBC: 7.2 10*3/uL (ref 4.0–10.5)
nRBC: 0 % (ref 0.0–0.2)

## 2021-08-31 LAB — ECHOCARDIOGRAM COMPLETE
AR max vel: 2.15 cm2
AV Area VTI: 2.45 cm2
AV Area mean vel: 2.21 cm2
AV Mean grad: 4 mmHg
AV Peak grad: 7.6 mmHg
Ao pk vel: 1.38 m/s
Area-P 1/2: 5.97 cm2
Calc EF: 50.4 %
Height: 72 in
S' Lateral: 2.9 cm
Single Plane A2C EF: 44.3 %
Single Plane A4C EF: 55.4 %
Weight: 2850.11 oz

## 2021-08-31 LAB — BASIC METABOLIC PANEL
Anion gap: 7 (ref 5–15)
BUN: 22 mg/dL — ABNORMAL HIGH (ref 6–20)
CO2: 19 mmol/L — ABNORMAL LOW (ref 22–32)
Calcium: 8.6 mg/dL — ABNORMAL LOW (ref 8.9–10.3)
Chloride: 104 mmol/L (ref 98–111)
Creatinine, Ser: 1.35 mg/dL — ABNORMAL HIGH (ref 0.61–1.24)
GFR, Estimated: >60 mL/min (ref >60)
Glucose, Bld: 250 mg/dL — ABNORMAL HIGH (ref 70–99)
Potassium: 3.8 mmol/L (ref 3.5–5.1)
Sodium: 130 mmol/L — ABNORMAL LOW (ref 135–145)

## 2021-08-31 LAB — GLUCOSE, CAPILLARY
Glucose-Capillary: 265 mg/dL — ABNORMAL HIGH (ref 70–99)
Glucose-Capillary: 251 mg/dL — ABNORMAL HIGH (ref 70–99)
Glucose-Capillary: 229 mg/dL — ABNORMAL HIGH (ref 70–99)
Glucose-Capillary: 266 mg/dL — ABNORMAL HIGH (ref 70–99)

## 2021-08-31 IMAGING — MR MR FOOT*R* W/O CM
4 of 5 series · 18 of 40 positions shown · non-contrast
Comparison: X-ray [DATE]

CLINICAL DATA: Osteomyelitis, foot Presented with ulcer on the
right second toe. Rule out osteomyelitis

EXAM:
MRI OF THE RIGHT FOREFOOT WITHOUT CONTRAST
TECHNIQUE: Multiplanar, multisequence MR imaging of the right forefoot was
performed. No intravenous contrast was administered.

[Series 3: T1 · coronal · 3.0mm · 0.29mm/px · 6 of 43 slices shown (1 of 2)]
[im 1/43]
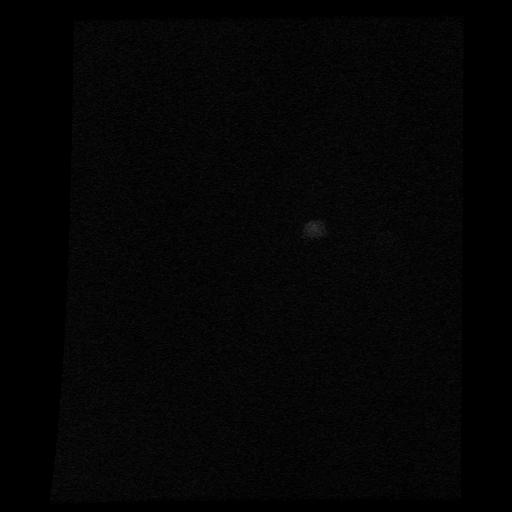
[im 5/43]
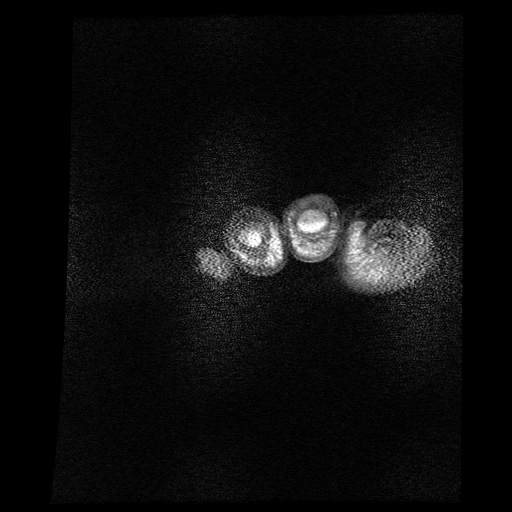
[im 15/43]
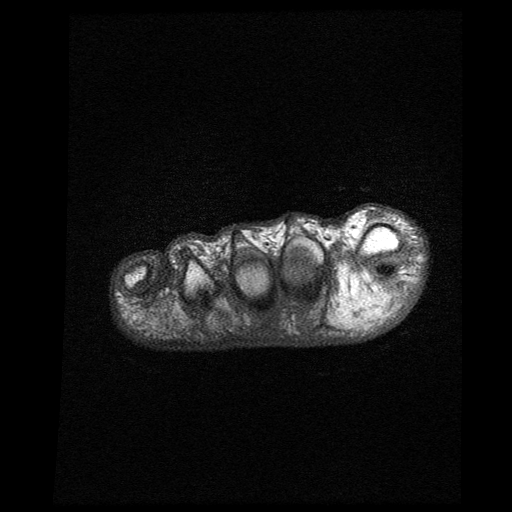
[im 19/43]
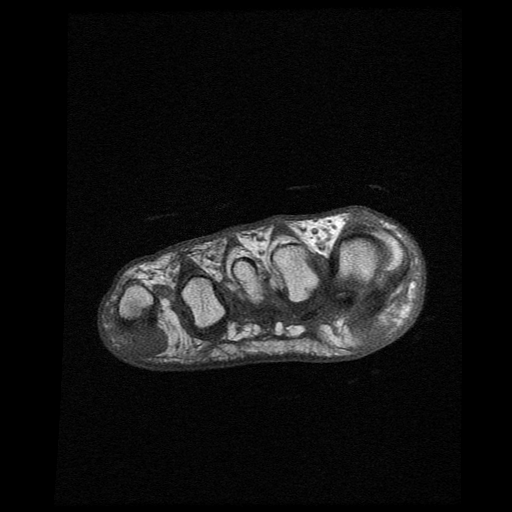
[im 24/43]
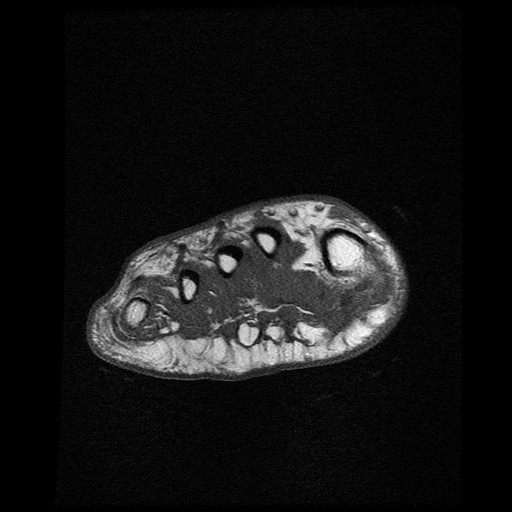
[im 38/43]
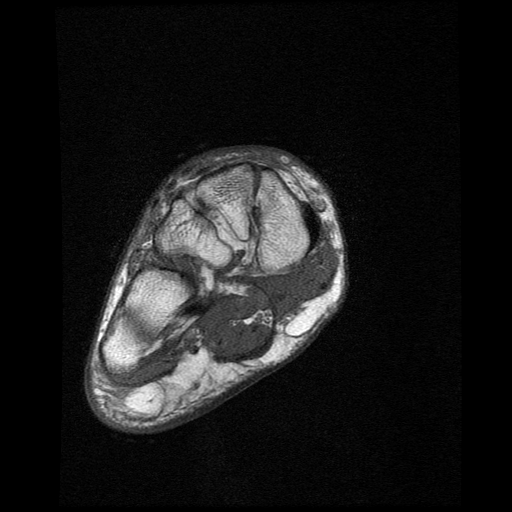

[Series 5: T2 · coronal · 3.0mm · 0.29mm/px · 3 of 43 slices shown]
[im 5/43]
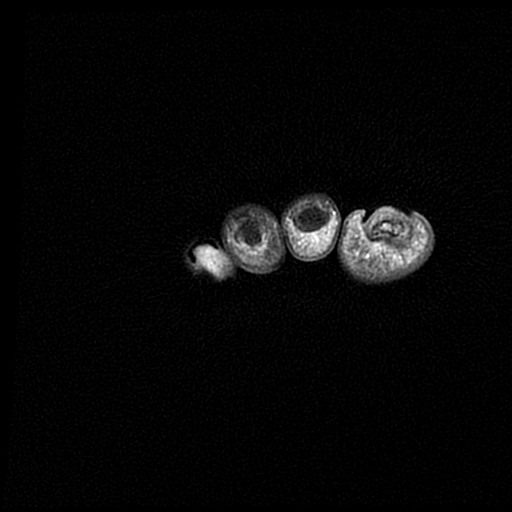
[im 22/43]
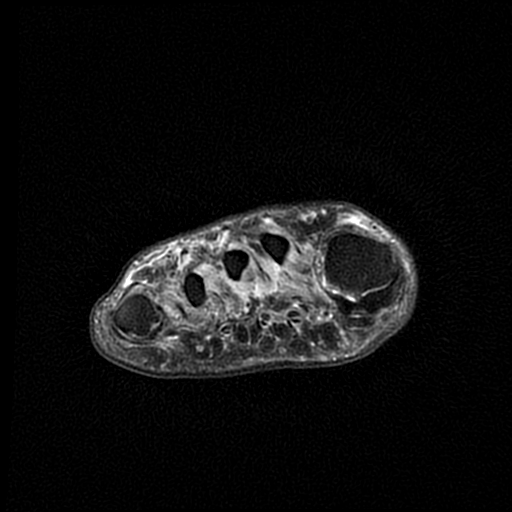
[im 38/43]
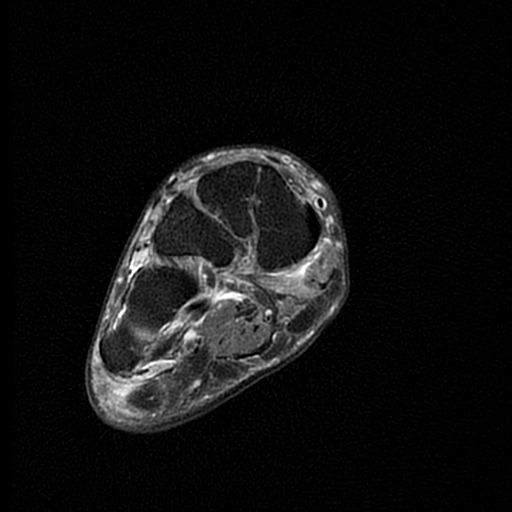

[Series 6: T1 · axial · 3.0mm · 0.31mm/px · z∈[-93,-39]mm · 3 of 22 slices shown (2 of 2)]
[im 5/22]
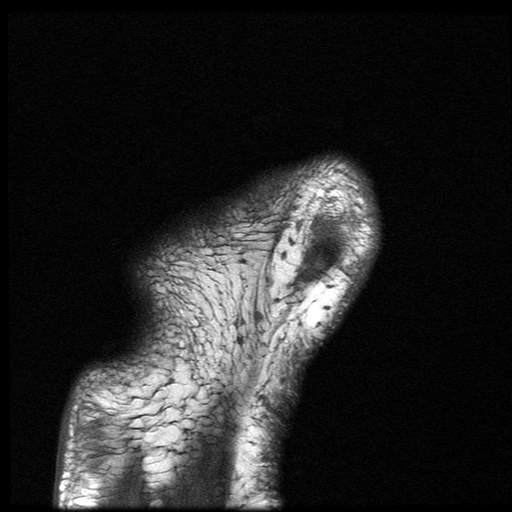
[im 13/22]
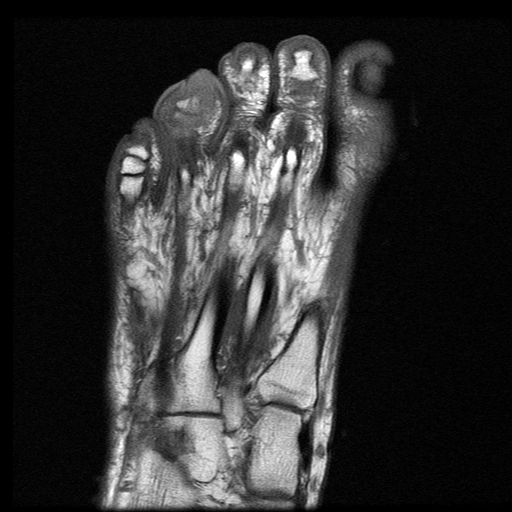
[im 22/22]
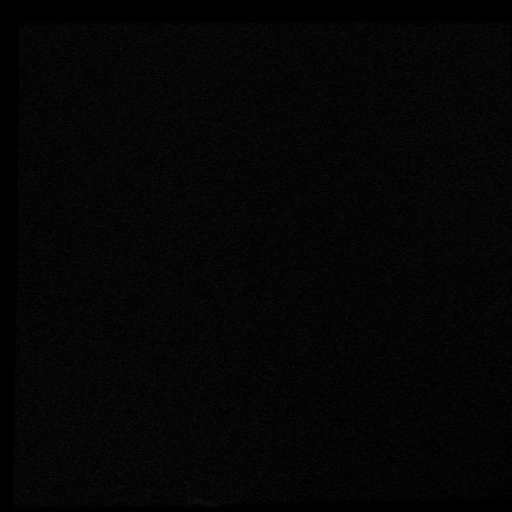

[Series 7: T2 fat-sat · axial · 3.0mm · 0.31mm/px · z∈[-106,-39]mm · 6 of 22 slices shown]
[im 1/22]
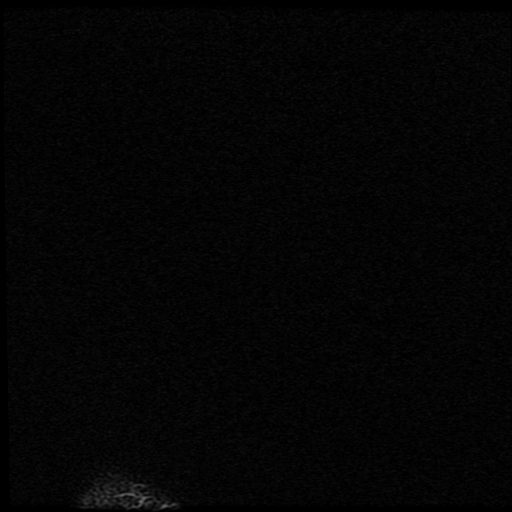
[im 5/22]
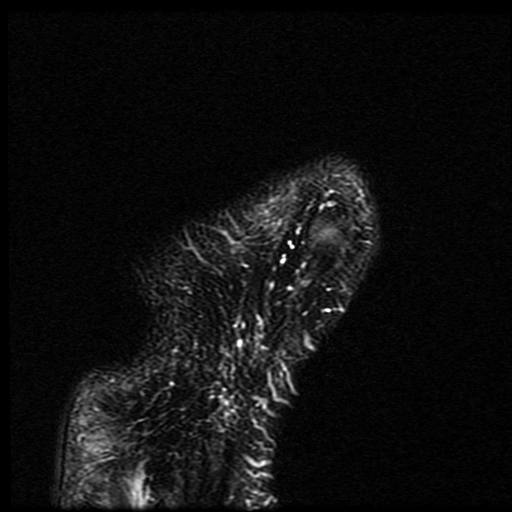
[im 9/22]
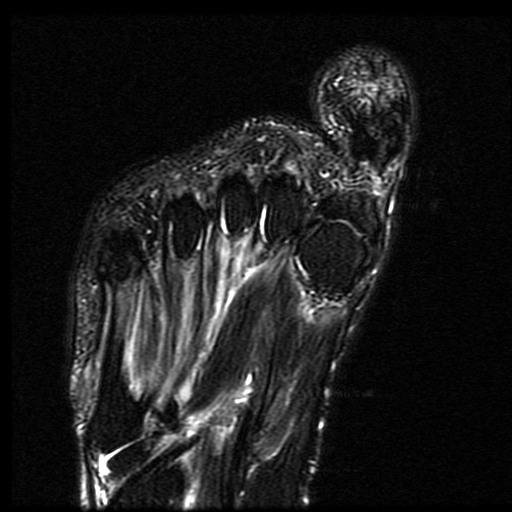
[im 13/22]
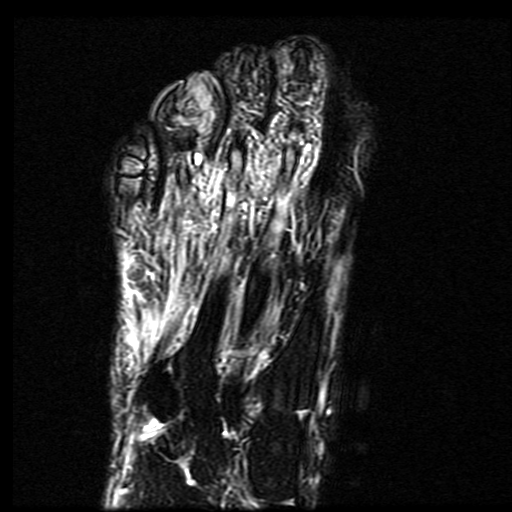
[im 17/22]
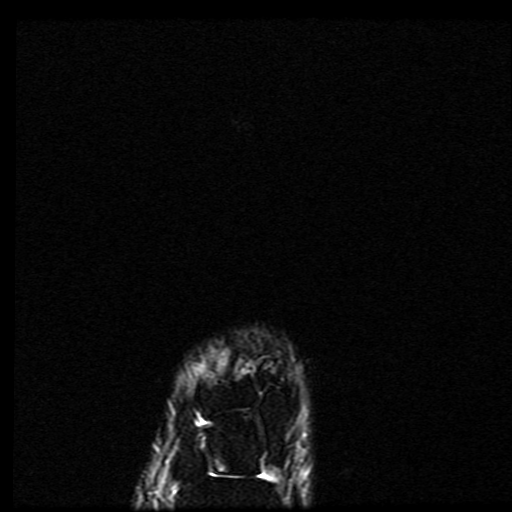
[im 22/22]
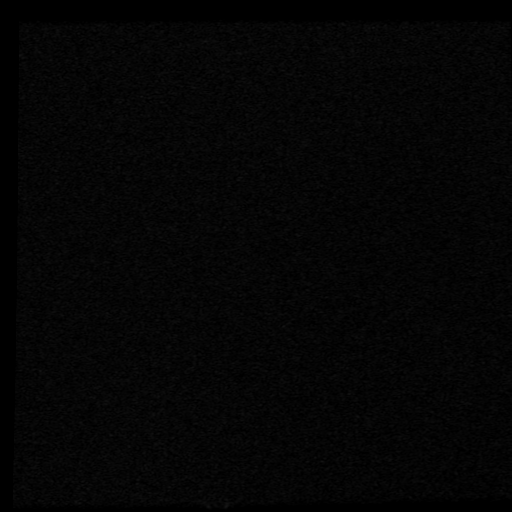

[18 of 40 positions shown; findings below may reference images not displayed]

FINDINGS: Bones/Joint/Cartilage

Bone marrow edema with confluent low T1 signal changes of the distal
phalanx of the fourth toe compatible with acute osteomyelitis.
Suspected erosion of the distal tuft. Mild bone marrow edema within
the middle phalanx of the fourth toe. Trace fourth toe DIP and PIP
effusions, which may be reactive or represent septic arthritis.

Mild bone marrow edema within the distal tuft of the great toe
distal phalanx without definite erosion.

Remaining osseous structures appear within normal limits. No
additional sites of osteomyelitis are identified. No acute fracture
or dislocation. Joint spaces of the forefoot are preserved.

Ligaments

Intact Lisfranc ligament. Collateral ligaments of the forefoot
appear intact.

Muscles and Tendons

Diffuse edema-like signal throughout the intrinsic foot musculature
may reflect changes related to denervation and/or myositis. Intact
flexor and extensor tendons.

Soft tissues

Soft tissue wound or ulceration at the distal aspect of the fourth
toe. Soft tissue swelling of the fourth toe with a 14 x 6 x 11 mm
fluid collection adjacent to the fourth toe distal phalanx.
IMPRESSION: 1. Soft tissue wound or ulceration at the distal aspect of the
fourth toe with acute osteomyelitis of the distal phalanx. Mild bone
marrow edema within the middle phalanx of the fourth toe, reactive
osteitis versus early acute osteomyelitis.
2. 14 mm abscess within the soft tissues adjacent to the fourth toe
distal phalanx.
3. Trace fourth toe DIP and PIP effusions, which may be reactive or
represent septic arthritis.
4. Mild bone marrow edema within the distal tuft of the great toe
distal phalanx without definite erosion. Findings may be reactive or
represent early osteomyelitis.
5. Diffuse edema-like signal throughout the intrinsic foot
musculature may reflect changes related to denervation and/or
myositis.

## 2021-08-31 MED ORDER — HEPARIN (PORCINE) 25000 UT/250ML-% IV SOLN
1400.0000 [IU]/h | INTRAVENOUS | Status: DC
  Administered 2021-08-31 (×2): 1250 [IU]/h via INTRAVENOUS
  Administered 2021-09-01 – 2021-09-02 (×2): 1400 [IU]/h via INTRAVENOUS
  Filled 2021-08-31 (×3): qty 250

## 2021-08-31 MED ORDER — CEFADROXIL 500 MG PO CAPS
500.0000 mg | ORAL_CAPSULE | Freq: Two times a day (BID) | ORAL | Status: DC
  Filled 2021-08-31 (×2): qty 1

## 2021-08-31 MED ORDER — SODIUM CHLORIDE 0.9 % IV SOLN
2.0000 g | INTRAVENOUS | Status: DC
  Administered 2021-08-31 – 2021-09-06 (×7): 2 g via INTRAVENOUS
  Filled 2021-08-31 (×9): qty 20

## 2021-08-31 MED ORDER — INSULIN GLARGINE-YFGN 100 UNIT/ML ~~LOC~~ SOLN
30.0000 [IU] | Freq: Every day | SUBCUTANEOUS | Status: DC
  Administered 2021-08-31 – 2021-09-02 (×3): 30 [IU] via SUBCUTANEOUS
  Filled 2021-08-31 (×4): qty 0.3

## 2021-08-31 MED ORDER — INSULIN ASPART 100 UNIT/ML IJ SOLN
5.0000 [IU] | Freq: Three times a day (TID) | INTRAMUSCULAR | Status: DC
  Administered 2021-08-31 – 2021-09-01 (×5): 5 [IU] via SUBCUTANEOUS

## 2021-08-31 MED ORDER — METRONIDAZOLE 500 MG PO TABS
500.0000 mg | ORAL_TABLET | Freq: Two times a day (BID) | ORAL | Status: DC
  Administered 2021-08-31 – 2021-09-06 (×11): 500 mg via ORAL
  Filled 2021-08-31 (×11): qty 1
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
## 2021-08-31 NOTE — Plan of Care (Signed)
  Problem: Clinical Measurements: Goal: Signs and symptoms of infection will decrease Outcome: Progressing   Problem: Clinical Measurements: Goal: Ability to maintain clinical measurements within normal limits will improve Outcome: Progressing Goal: Respiratory complications will improve Outcome: Progressing   Problem: Activity: Goal: Risk for activity intolerance will decrease Outcome: Progressing

## 2021-08-31 NOTE — Progress Notes (Addendum)
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             +---------+------------------+-----+----------+--------+ +---------+------------------+-----+----------+-------+ Left     Lt Pressure (mmHg)IndexWaveform  Comment +---------+------------------+-----+----------+-------+ Brachial 158  triphasic         +---------+------------------+-----+----------+-------+ PTA      127               0.76 monophasic        +---------+------------------+-----+----------+-------+ DP       113               0.67 monophasic        +---------+------------------+-----+----------+-------+ Great Toe84                0.50                   +---------+------------------+-----+----------+-------+ +-------+-----------+-----------+------------+------------+ ABI/TBIToday's ABIToday's TBIPrevious  ABIPrevious TBI +-------+-----------+-----------+------------+------------+ Right  0.82       0.00                                +-------+-----------+-----------+------------+------------+ Left   0.76       0.50                                +-------+-----------+-----------+------------+------------+  Summary: Right: Resting right ankle-brachial index indicates mild right lower extremity arterial disease. Absent great toe PPG waveform. Left: Resting left ankle-brachial index indicates moderate left lower extremity arterial disease. The left toe-brachial index is abnormal. Guidlines:  Patients with an ABI >1.2 or <0.9 have a high risk of peripheral atherosclerosis. Patients with asymptomatic peripheral atherosclerosis can be managed without further referral, at the discretion of the ordering provider. Guidelines for medical therapy include the following: - Patients with ABI evidence of peripheral atherosclerosis and symptoms of ischemic rest pain, with or without lower extremities wounds should be referred to a vascular specialist for urgent evaluation. The following therapies are recommendations from the Society of Vascular Surgery for patients with peripheral arterial disease: _ Multidisciplinary comprehensive smoking cessation interventions. _ Blood glucose control with goal A1c < 7%. _ Blood pressure control, < 140/90 mmHg. _ Statin therapy, goal LDL-C at least <100 mg/dL. _ Supervised exercise program (30-60 min of walking, three times a week) for patients with intermittent claudication. _ Aspirin 81mg  by mouth daily  *See table(s) above for measurements and observations.  Electronically signed by Deitra Mayo MD on 08/31/2021 at 10:52:34 AM.    Final     Scheduled Meds:  apixaban  5 mg Oral BID   carvedilol  3.125 mg Oral BID WC   digoxin  125 mcg Oral Daily   feeding supplement (GLUCERNA SHAKE)  237 mL Oral BID BM   insulin aspart  0-15 Units Subcutaneous TID WC   insulin aspart   0-5 Units Subcutaneous QHS   insulin aspart  5 Units Subcutaneous TID WC   insulin glargine-yfgn  30 Units Subcutaneous QHS   loratadine  10 mg Oral Daily   Continuous Infusions:  metronidazole 500 mg (08/30/21 2217)   vancomycin 750 mg (08/30/21 2323)     LOS: 1 day   Shelly Coss, MD [facility] P5/30/2023, 11:01 AM

## 2021-08-31 NOTE — Consult Note (Addendum)
David [name] for Infectious Disease    Date of Admission:  08/29/2021     Total days of antibiotics 3               Reason for Consult: MRSA Bacteremia  Referring Provider: Tivis Ringer / [place] Primary Care Provider: Gildardo Pounds, NP   ASSESSMENT:  David [name] is a 61 y/o gentleman with poorly controlled Type 2 diabetes (most recent A1c of 11.2) admitted with A-fib with RPR and found to have MRSA bacteremia with abscess of right 4th toe and acute osteomyelitis. Blood cultures from 5/29 without growth in <12 hours and TTE results pending. Vascular Surgery recommending angiogram with possible intervention and awaiting Orthopedic evaluation by Dr. Sharol Given with potential for amputation. Antibiotics narrowed to vancomycin, metronidazole and will start cefadroxil for gram negative coverage. Therapeutic drug monitoring of renal function and vancomycin levels. Monitor cultures for clearance of bacteremia. With poorly controlled diabetes remains at high risk for complicated healing and further infection. Remaining medical and supportive care per primary team.   PLAN:  Continue vancomycin and metronidazole Start cefadroxil.  Therapeutic drug monitoring of renal function and vancomycin levels. Await TTE results.  Monitor cultures for clearance of bacteremia. Angiography per Vascular Surgery Awaiting Ortho eval by Dr. Sharol Given for surgical recommendations. Diabetes and remaining medical/supportive care per primary team.    Principal Problem:   Sepsis ([place]) Active Problems:   Essential hypertension   Insulin dependent type 2 diabetes mellitus (HCC)   Chronic systolic CHF (congestive heart failure) (HCC)   Persistent atrial fibrillation (HCC)   SIRS (systemic inflammatory response syndrome) (HCC)   Hyponatremia   Hypomagnesemia   Cellulitis in diabetic foot (HCC)    apixaban  5 mg Oral BID   carvedilol  3.125 mg Oral BID WC   digoxin  125 mcg Oral Daily   feeding supplement  (GLUCERNA SHAKE)  237 mL Oral BID BM   insulin aspart  0-15 Units Subcutaneous TID WC   insulin aspart  0-5 Units Subcutaneous QHS   insulin aspart  5 Units Subcutaneous TID WC   insulin glargine-yfgn  30 Units Subcutaneous QHS   loratadine  10 mg Oral Daily     HPI: David [name] is a 61 y.o. male with previous medical history as detailed below and significant for Type 2 diabetes, hypertension, A-fib on Eliquis and chronic systolic heart failure admitted with fatigue and heart palpitations.   David [name] initially presented with fatigue and heart palpitations after EMS found A-fib with RVR enroute. Had been feeling ill with concern for possible sinus infection. Febrile with temperature of 101.6 F with WBC count of 12.3 and was hyponatremic with Na of 125. Chest x-ray with no cardiopulmonary disease. CT head, chest, abdomen/pelvis with no acute findings. Concern for cellulitis with ulcer of the right 2nd toe. Started on broad spectrum antibiotics with vancomycin, cefepime and metronidazole. Blood cultures drawn are positive for MRSA. Left foot x-ray with no significant findings. ABIs obtained with mild arterial disease on the right and moderate on the left. MRI left foot with soft tissue wound with acute osteomyelitis of the distal phalanx of the fourth toe; abscess within the soft tissue adjacent to the fourth toe distal phalanx, and possible early osteomyelitis vs reaction in the tuft of the great toe.   Fever curve and leukocytosis down trending. Blood cultures ordered on 5/29 without growth in <12 hours. TTE results currently pending. Most recent A1c of 11.2. Evaluated by Vascular Surgery with recommendation  of angiogram with possible intervention or possible bypass. Orthopedics evaluated and awaiting evaluation by Dr. Sharol Given with potential need for amputation.      Review of Systems: Review of Systems  Constitutional:  Negative for chills, fever and weight loss.  Respiratory:  Negative for  cough, shortness of breath and wheezing.   Cardiovascular:  Negative for chest pain and leg swelling.  Gastrointestinal:  Negative for abdominal pain, constipation, diarrhea, nausea and vomiting.  Skin:  Negative for rash.    Past Medical History:  Diagnosis Date   Cardiomyopathy ([place])    a. EF 45% in 2019.   CHF (congestive heart failure) (HCC)    Chronic anticoagulation 09/30/2017   Diabetes mellitus type 2 in nonobese [facility])    Does not have health insurance    Essential hypertension 09/26/2017   Financial difficulties    H/O noncompliance with medical treatment, presenting hazards to health    Non-insulin treated type 2 diabetes mellitus ([place]) 09/26/2017   Persistent atrial fibrillation (HCC)    Sleep apnea suspected 09/30/2017    Social History   Tobacco Use   Smoking status: Former    Years: 15.00    Types: Cigarettes    Quit date: 03/18/2018    Years since quitting: 3.4   Smokeless tobacco: Never   Tobacco comments:    09/26/2017 "2-3 cigarettes/month now"  Vaping Use   Vaping Use: Never used  Substance Use Topics   Alcohol use: Yes    Alcohol/week: 3.0 standard drinks    Types: 3 Cans of beer per week   Drug use: Never    Family History  Problem Relation Age of Onset   Hypertension Mother     No Known Allergies  OBJECTIVE: Blood pressure 121/88, pulse 91, temperature 98.7 F (37.1 C), temperature source Oral, resp. rate 20, height 6' (1.829 m), weight 80.8 kg, SpO2 97 %.  Physical Exam Constitutional:      General: He is not in acute distress.    Appearance: He is well-developed.  Cardiovascular:     Rate and Rhythm: Normal rate. Rhythm irregularly irregular.     Heart sounds: Normal heart sounds.  Pulmonary:     Effort: Pulmonary effort is normal.     Breath sounds: Normal breath sounds.  Musculoskeletal:     Comments: Right 4th to discolored and with red streak moving proximally.   Skin:    General: Skin is warm and dry.  Neurological:      Mental Status: He is alert and oriented to person, place, and time.  Psychiatric:        Behavior: Behavior normal.        Thought Content: Thought content normal.        Judgment: Judgment normal.       Lab Results Lab Results  Component Value Date   WBC 7.2 08/31/2021   HGB 14.4 08/31/2021   HCT 41.7 08/31/2021   MCV 93.1 08/31/2021   PLT 166 08/31/2021    Lab Results  Component Value Date   CREATININE 1.35 (H) 08/31/2021   BUN 22 (H) 08/31/2021   NA 130 (L) 08/31/2021   K 3.8 08/31/2021   CL 104 08/31/2021   CO2 19 (L) 08/31/2021    Lab Results  Component Value Date   ALT 17 08/30/2021   AST 21 08/30/2021   ALKPHOS 87 08/30/2021   BILITOT 1.2 08/30/2021     Microbiology: Recent Results (from the past 240 hour(s))  Resp Panel by RT-PCR (Flu  A&B, Covid) Urine, Clean Catch     Status: None   Collection Time: 08/29/21  7:14 PM   Specimen: Urine, Clean Catch; Nasal Swab  Result Value Ref Range Status   SARS Coronavirus 2 by RT PCR NEGATIVE NEGATIVE Final    Comment: (NOTE) SARS-CoV-2 target nucleic acids are NOT DETECTED.  The SARS-CoV-2 RNA is generally detectable in upper respiratory specimens during the acute phase of infection. The lowest concentration of SARS-CoV-2 viral copies this assay can detect is 138 copies/mL. A negative result does not preclude SARS-Cov-2 infection and should not be used as the sole basis for treatment or other patient management decisions. A negative result may occur with  improper specimen collection/handling, submission of specimen other than nasopharyngeal swab, presence of viral mutation(s) within the areas targeted by this assay, and inadequate number of viral copies(<138 copies/mL). A negative result must be combined with clinical observations, patient history, and epidemiological information. The expected result is Negative.  Fact Sheet for Patients:  EntrepreneurPulse.com.au  Fact Sheet for Healthcare  Providers:  IncredibleEmployment.be  This test is no t yet approved or cleared by the [place] FDA and  has been authorized for detection and/or diagnosis of SARS-CoV-2 by FDA under an Emergency Use Authorization (EUA). This EUA will remain  in effect (meaning this test can be used) for the duration of the COVID-19 declaration under Section 564(b)(1) of the Act, 21 U.S.C.section 360bbb-3(b)(1), unless the authorization is terminated  or revoked sooner.       Influenza A by PCR NEGATIVE NEGATIVE Final   Influenza B by PCR NEGATIVE NEGATIVE Final    Comment: (NOTE) The Xpert Xpress SARS-CoV-2/FLU/RSV plus assay is intended as an aid in the diagnosis of influenza from Nasopharyngeal swab specimens and should not be used as a sole basis for treatment. Nasal washings and aspirates are unacceptable for Xpert Xpress SARS-CoV-2/FLU/RSV testing.  Fact Sheet for Patients: EntrepreneurPulse.com.au  Fact Sheet for Healthcare Providers: IncredibleEmployment.be  This test is not yet approved or cleared by the [place] FDA and has been authorized for detection and/or diagnosis of SARS-CoV-2 by FDA under an Emergency Use Authorization (EUA). This EUA will remain in effect (meaning this test can be used) for the duration of the COVID-19 declaration under Section 564(b)(1) of the Act, 21 U.S.C. section 360bbb-3(b)(1), unless the authorization is terminated or revoked.  Performed at [facility], [place] [address], [place], [place] [address]   Culture, blood (routine x 2)     Status: Abnormal (Preliminary result)   Collection Time: 08/29/21  8:05 PM   Specimen: BLOOD  Result Value Ref Range Status   Specimen Description BLOOD RIGHT ANTECUBITAL  Final   Special Requests   Final    BOTTLES DRAWN AEROBIC AND ANAEROBIC Blood Culture adequate volume   Culture  Setup Time   Final    GRAM POSITIVE COCCI IN CLUSTERS IN BOTH  AEROBIC AND ANAEROBIC BOTTLES CRITICAL RESULT CALLED TO, READ BACK BY AND VERIFIED WITH: [place] ON 08/30/21 @ 1943 BY DRT    Culture (A)  Final    STAPHYLOCOCCUS AUREUS SUSCEPTIBILITIES TO FOLLOW Performed at [facility], [place] [address] [place], [place] [address]    Report Status PENDING  Incomplete  Blood Culture ID Panel (Reflexed)     Status: Abnormal   Collection Time: 08/29/21  8:05 PM  Result Value Ref Range Status   Enterococcus faecalis NOT DETECTED NOT DETECTED Final   Enterococcus Faecium NOT DETECTED NOT DETECTED  Final   Listeria monocytogenes NOT DETECTED NOT DETECTED Final   Staphylococcus species DETECTED (A) NOT DETECTED Final    Comment: CRITICAL RESULT CALLED TO, READ BACK BY AND VERIFIED WITH: [place] ON 08/30/21 @ 1943 BY DRT    Staphylococcus aureus (BCID) DETECTED (A) NOT DETECTED Final    Comment: Methicillin (oxacillin)-resistant Staphylococcus aureus (MRSA). MRSA is predictably resistant to beta-lactam antibiotics (except ceftaroline). Preferred therapy is vancomycin unless clinically contraindicated. Patient requires contact precautions if  hospitalized. CRITICAL RESULT CALLED TO, READ BACK BY AND VERIFIED WITH: [place] ON 08/30/21 @ 1943 BY DRT    Staphylococcus epidermidis NOT DETECTED NOT DETECTED Final   Staphylococcus lugdunensis NOT DETECTED NOT DETECTED Final   Streptococcus species NOT DETECTED NOT DETECTED Final   Streptococcus agalactiae NOT DETECTED NOT DETECTED Final   Streptococcus pneumoniae NOT DETECTED NOT DETECTED Final   Streptococcus pyogenes NOT DETECTED NOT DETECTED Final   A.calcoaceticus-baumannii NOT DETECTED NOT DETECTED Final   Bacteroides fragilis NOT DETECTED NOT DETECTED Final   Enterobacterales NOT DETECTED NOT DETECTED Final   Enterobacter cloacae complex NOT DETECTED NOT DETECTED Final   Escherichia coli NOT DETECTED NOT DETECTED Final   Klebsiella aerogenes NOT DETECTED NOT DETECTED  Final   Klebsiella oxytoca NOT DETECTED NOT DETECTED Final   Klebsiella pneumoniae NOT DETECTED NOT DETECTED Final   Proteus species NOT DETECTED NOT DETECTED Final   Salmonella species NOT DETECTED NOT DETECTED Final   Serratia marcescens NOT DETECTED NOT DETECTED Final   Haemophilus influenzae NOT DETECTED NOT DETECTED Final   Neisseria meningitidis NOT DETECTED NOT DETECTED Final   Pseudomonas aeruginosa NOT DETECTED NOT DETECTED Final   Stenotrophomonas maltophilia NOT DETECTED NOT DETECTED Final   Candida albicans NOT DETECTED NOT DETECTED Final   Candida auris NOT DETECTED NOT DETECTED Final   Candida glabrata NOT DETECTED NOT DETECTED Final   Candida krusei NOT DETECTED NOT DETECTED Final   Candida parapsilosis NOT DETECTED NOT DETECTED Final   Candida tropicalis NOT DETECTED NOT DETECTED Final   Cryptococcus neoformans/gattii NOT DETECTED NOT DETECTED Final   Meth resistant mecA/C and MREJ DETECTED (A) NOT DETECTED Final    Comment: CRITICAL RESULT CALLED TO, READ BACK BY AND VERIFIED WITH: PHARMD FRANK WILSON ON 08/30/21 @ 1943 BY DRT Performed at [facility], [address], [place], [place] [address]   Urine Culture     Status: Abnormal   Collection Time: 08/29/21 11:11 PM   Specimen: Urine, Clean Catch  Result Value Ref Range Status   Specimen Description URINE, CLEAN CATCH  Final   Special Requests NONE  Final   Culture (A)  Final    <10,000 COLONIES/mL INSIGNIFICANT GROWTH Performed at [facility], [place] [address] [place],  [address]    Report Status 08/30/2021 FINAL  Final  Respiratory (~20 pathogens) panel by PCR     Status: None   Collection Time: 08/30/21 12:04 AM   Specimen: Urine, Clean Catch; Respiratory  Result Value Ref Range Status   Adenovirus NOT DETECTED NOT DETECTED Final   Coronavirus 229E NOT DETECTED NOT DETECTED Final    Comment: (NOTE) The Coronavirus on the Respiratory Panel, DOES NOT test for the novel  Coronavirus (2019  nCoV)    Coronavirus HKU1 NOT DETECTED NOT DETECTED Final   Coronavirus NL63 NOT DETECTED NOT DETECTED Final   Coronavirus OC43 NOT DETECTED NOT DETECTED Final   Metapneumovirus NOT DETECTED NOT DETECTED Final   Rhinovirus / Enterovirus NOT DETECTED  NOT DETECTED Final   Influenza A NOT DETECTED NOT DETECTED Final   Influenza B NOT DETECTED NOT DETECTED Final   Parainfluenza Virus 1 NOT DETECTED NOT DETECTED Final   Parainfluenza Virus 2 NOT DETECTED NOT DETECTED Final   Parainfluenza Virus 3 NOT DETECTED NOT DETECTED Final   Parainfluenza Virus 4 NOT DETECTED NOT DETECTED Final   Respiratory Syncytial Virus NOT DETECTED NOT DETECTED Final   Bordetella pertussis NOT DETECTED NOT DETECTED Final   Bordetella Parapertussis NOT DETECTED NOT DETECTED Final   Chlamydophila pneumoniae NOT DETECTED NOT DETECTED Final   Mycoplasma pneumoniae NOT DETECTED NOT DETECTED Final    Comment: Performed at [facility], [place] [address], [place], [place] [address]  Culture, blood (Routine X 2) w Reflex to ID Panel     Status: None (Preliminary result)   Collection Time: 08/30/21  2:32 AM   Specimen: BLOOD RIGHT HAND  Result Value Ref Range Status   Specimen Description BLOOD RIGHT HAND  Final   Special Requests   Final    BOTTLES DRAWN AEROBIC ONLY Blood Culture results may not be optimal due to an inadequate volume of blood received in culture bottles   Culture   Final    NO GROWTH 1 DAY Performed at [facility], [place] [address] [place], [place] [address]    Report Status PENDING  Incomplete  MRSA Next Gen by PCR, Nasal     Status: Abnormal   Collection Time: 08/30/21  4:33 AM   Specimen: Nasal Mucosa; Nasal Swab  Result Value Ref Range Status   MRSA by PCR Next Gen DETECTED (A) NOT DETECTED Final    Comment: RESULT CALLED TO, READ BACK BY AND VERIFIED WITH: T IRBY,RN@[id number]  08/30/21 [place] (NOTE) The GeneXpert MRSA Assay (FDA approved for NASAL specimens only), is one component of a  comprehensive MRSA colonization surveillance program. It is not intended to diagnose MRSA infection nor to guide or monitor treatment for MRSA infections. Test performance is not FDA approved in patients less than 17 years old. Performed at [facility], [place] [address] [place], [place] [address]   Culture, blood (Routine X 2) w Reflex to ID Panel     Status: None (Preliminary result)   Collection Time: 08/30/21 10:30 PM   Specimen: BLOOD RIGHT HAND  Result Value Ref Range Status   Specimen Description BLOOD RIGHT HAND  Final   Special Requests AEROBIC BOTTLE ONLY Blood Culture adequate volume  Final   Culture   Final    NO GROWTH < 12 HOURS Performed at [facility], [place] [address], [place], [place] [address]    Report Status PENDING  Incomplete  Culture, blood (Routine X 2) w Reflex to ID Panel     Status: None (Preliminary result)   Collection Time: 08/30/21 10:30 PM   Specimen: BLOOD LEFT HAND  Result Value Ref Range Status   Specimen Description BLOOD LEFT HAND  Final   Special Requests AEROBIC BOTTLE ONLY Blood Culture adequate volume  Final   Culture   Final    NO GROWTH < 12 HOURS Performed at [facility], [place] [address], [place],  [address]    Report Status PENDING  Incomplete     Terri Piedra, [place] [facility] Infectious [facility]  08/31/2021  12:09 PM

## 2021-08-31 NOTE — Progress Notes (Signed)
  Echocardiogram 2D Echocardiogram has been performed.  Joette Catching 08/31/2021, 11:42 AM

## 2021-08-31 NOTE — Consult Note (Signed)
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 male.  HPI: David [name] has had an ulcer on his 4th toe for about 2 weeks. Over the weekend he began to feel bad with fatigue, subjective fevers, and chills. His S-I-L called 911 yesterday. He was brought to the ED, diagnosed with sepsis and admitted. MRI of the foot showed osteo and orthopedic surgery was consulted. He works as a Investment banker, operational and has not had prior hx/o foot ulcers.  Past Medical History:  Diagnosis Date   Cardiomyopathy (HCC)    a. EF 45% in 2019.   CHF (congestive heart failure) (HCC)    Chronic anticoagulation 09/30/2017   Diabetes mellitus type 2 in nonobese [facility])    Does not have health insurance    Essential hypertension 09/26/2017   Financial difficulties    H/O noncompliance with medical treatment, presenting hazards to health    Non-insulin treated type 2 diabetes mellitus (HCC) 09/26/2017   Persistent atrial fibrillation (HCC)    Sleep apnea suspected 09/30/2017    Past Surgical History:  Procedure Laterality Date   APPENDECTOMY  1971   CARDIOVERSION N/A 09/28/2017   Procedure: CARDIOVERSION;  Surgeon: Thurmon Fair, MD;  Location: [facility] ENDOSCOPY;  Service: Cardiovascular;  Laterality: N/A;   TEE WITHOUT CARDIOVERSION N/A 09/28/2017   Procedure: TRANSESOPHAGEAL ECHOCARDIOGRAM (TEE);  Surgeon: Thurmon Fair, MD;  Location: [facility] ENDOSCOPY;  Service: Cardiovascular;  Laterality: N/A;    Family History  Problem Relation Age of Onset   Hypertension Mother     Social History:  reports that he quit smoking about 3 years ago. His smoking use included cigarettes. He has never used smokeless tobacco. He reports current alcohol use of about 3.0 standard drinks per week. He reports that he does not use drugs.  Allergies: No Known Allergies  Medications: I have reviewed the patient's current medications.  Results for orders placed or  performed during the hospital encounter of 08/29/21 (from the past 48 hour(s))  Comprehensive metabolic panel     Status: Abnormal   Collection Time: 08/29/21  5:32 PM  Result Value Ref Range   Sodium 125 (L) 135 - 145 mmol/L   Potassium 4.4 3.5 - 5.1 mmol/L   Chloride 93 (L) 98 - 111 mmol/L   CO2 21 (L) 22 - 32 mmol/L   Glucose, Bld 284 (H) 70 - 99 mg/dL    Comment: Glucose reference range applies only to samples taken after fasting for at least 8 hours.   BUN 22 (H) 6 - 20 mg/dL   Creatinine, Ser 4.13 (H) 0.61 - 1.24 mg/dL   Calcium 9.2 8.9 - 24.4 mg/dL   Total Protein 6.8 6.5 - 8.1 g/dL   Albumin 3.3 (L) 3.5 - 5.0 g/dL   AST 24 15 - 41 U/L   ALT 17 0 - 44 U/L   Alkaline Phosphatase 89 38 - 126 U/L   Total Bilirubin 1.8 (H) 0.3 - 1.2 mg/dL   GFR, Estimated >01 >02 mL/min    Comment: (NOTE) Calculated using the CKD-EPI Creatinine Equation (2021)    Anion gap 11 5 - 15    Comment: Performed at [facility], [address], [place], [place] [address]  CBC with Differential     Status: Abnormal   Collection Time: 08/29/21  5:32 PM  Result Value Ref Range   WBC 18.7 (H) 4.0 - 10.5 K/uL   RBC 4.77  4.22 - 5.81 MIL/uL   Hemoglobin 15.7 13.0 - 17.0 g/dL   HCT 16.1 09.6 - 04.5 %   MCV 93.5 80.0 - 100.0 fL   MCH 32.9 26.0 - 34.0 pg   MCHC 35.2 30.0 - 36.0 g/dL   RDW 40.9 81.1 - 91.4 %   Platelets 207 150 - 400 K/uL   nRBC 0.0 0.0 - 0.2 %   Neutrophils Relative % 89 %   Neutro Abs 16.6 (H) 1.7 - 7.7 K/uL   Lymphocytes Relative 4 %   Lymphs Abs 0.8 0.7 - 4.0 K/uL   Monocytes Relative 6 %   Monocytes Absolute 1.1 (H) 0.1 - 1.0 K/uL   Eosinophils Relative 0 %   Eosinophils Absolute 0.1 0.0 - 0.5 K/uL   Basophils Relative 0 %   Basophils Absolute 0.1 0.0 - 0.1 K/uL   Immature Granulocytes 1 %   Abs Immature Granulocytes 0.17 (H) 0.00 - 0.07 K/uL    Comment: Performed at [facility], [address], [place], [place] [address]  Magnesium     Status: Abnormal    Collection Time: 08/29/21  5:32 PM  Result Value Ref Range   Magnesium 1.6 (L) 1.7 - 2.4 mg/dL    Comment: Performed at [facility], [address] [place], [place] [address]  POC CBG, ED     Status: Abnormal   Collection Time: 08/29/21  5:37 PM  Result Value Ref Range   Glucose-Capillary 313 (H) 70 - 99 mg/dL    Comment: Glucose reference range applies only to samples taken after fasting for at least 8 hours.  Urinalysis, Routine w reflex microscopic Urine, Clean Catch     Status: Abnormal   Collection Time: 08/29/21  7:14 PM  Result Value Ref Range   Color, Urine STRAW (A) YELLOW   APPearance CLEAR CLEAR   Specific Gravity, Urine 1.009 1.005 - 1.030   pH 6.0 5.0 - 8.0   Glucose, UA >=500 (A) NEGATIVE mg/dL   Hgb urine dipstick NEGATIVE NEGATIVE   Bilirubin Urine NEGATIVE NEGATIVE   Ketones, ur NEGATIVE NEGATIVE mg/dL   Protein, ur 865 (A) NEGATIVE mg/dL   Nitrite NEGATIVE NEGATIVE   Leukocytes,Ua NEGATIVE NEGATIVE   RBC / HPF 0-5 0 - 5 RBC/hpf   WBC, UA 0-5 0 - 5 WBC/hpf   Bacteria, UA NONE SEEN NONE SEEN    Comment: Performed at [facility], [address] [place], [place] [address]  Resp Panel by RT-PCR (Flu A&B, Covid) Urine, Clean Catch     Status: None   Collection Time: 08/29/21  7:14 PM   Specimen: Urine, Clean Catch; Nasal Swab  Result Value Ref Range   SARS Coronavirus 2 by RT PCR NEGATIVE NEGATIVE    Comment: (NOTE) SARS-CoV-2 target nucleic acids are NOT DETECTED.  The SARS-CoV-2 RNA is generally detectable in upper respiratory specimens during the acute phase of infection. The lowest concentration of SARS-CoV-2 viral copies this assay can detect is 138 copies/mL. A negative result does not preclude SARS-Cov-2 infection and should not be used as the sole basis for treatment or other patient management decisions. A negative result may occur with  improper specimen collection/handling, submission of specimen other than nasopharyngeal swab, presence  of viral mutation(s) within the areas targeted by this assay, and inadequate number of viral copies(<138 copies/mL). A negative result must be combined with clinical observations, patient history, and epidemiological information. The expected result is Negative.  Fact Sheet for Patients:  BloggerCourse.com  Fact Sheet for Healthcare Providers:  SeriousBroker.it  This test is no t yet approved or cleared by the [place] FDA and  has been authorized for detection and/or diagnosis of SARS-CoV-2 by FDA under an Emergency Use Authorization (EUA). This EUA will remain  in effect (meaning this test can be used) for the duration of the COVID-19 declaration under Section 564(b)(1) of the Act, 21 U.S.C.section 360bbb-3(b)(1), unless the authorization is terminated  or revoked sooner.       Influenza A by PCR NEGATIVE NEGATIVE   Influenza B by PCR NEGATIVE NEGATIVE    Comment: (NOTE) The Xpert Xpress SARS-CoV-2/FLU/RSV plus assay is intended as an aid in the diagnosis of influenza from Nasopharyngeal swab specimens and should not be used as a sole basis for treatment. Nasal washings and aspirates are unacceptable for Xpert Xpress SARS-CoV-2/FLU/RSV testing.  Fact Sheet for Patients: BloggerCourse.com  Fact Sheet for Healthcare Providers: SeriousBroker.it  This test is not yet approved or cleared by the [place] FDA and has been authorized for detection and/or diagnosis of SARS-CoV-2 by FDA under an Emergency Use Authorization (EUA). This EUA will remain in effect (meaning this test can be used) for the duration of the COVID-19 declaration under Section 564(b)(1) of the Act, 21 U.S.C. section 360bbb-3(b)(1), unless the authorization is terminated or revoked.  Performed at [facility], [address], [place], [place] [address]   Lactic acid, plasma     Status: None    Collection Time: 08/29/21  8:05 PM  Result Value Ref Range   Lactic Acid, Venous 1.4 0.5 - 1.9 mmol/L    Comment: Performed at [facility], [address], [place], [place] [address]  Culture, blood (routine x 2)     Status: Abnormal (Preliminary result)   Collection Time: 08/29/21  8:05 PM   Specimen: BLOOD  Result Value Ref Range   Specimen Description BLOOD RIGHT ANTECUBITAL    Special Requests      BOTTLES DRAWN AEROBIC AND ANAEROBIC Blood Culture adequate volume   Culture  Setup Time      GRAM POSITIVE COCCI IN CLUSTERS IN BOTH AEROBIC AND ANAEROBIC BOTTLES CRITICAL RESULT CALLED TO, READ BACK BY AND VERIFIED WITH: PHARMD FRANK WILSON ON 08/30/21 @ 1943 BY DRT    Culture (A)     STAPHYLOCOCCUS AUREUS SUSCEPTIBILITIES TO FOLLOW Performed at [facility], [address] [place], [place] [address]    Report Status PENDING   Blood Culture ID Panel (Reflexed)     Status: Abnormal   Collection Time: 08/29/21  8:05 PM  Result Value Ref Range   Enterococcus faecalis NOT DETECTED NOT DETECTED   Enterococcus Faecium NOT DETECTED NOT DETECTED   Listeria monocytogenes NOT DETECTED NOT DETECTED   Staphylococcus species DETECTED (A) NOT DETECTED    Comment: CRITICAL RESULT CALLED TO, READ BACK BY AND VERIFIED WITH: PHARMD FRANK WILSON ON 08/30/21 @ 1943 BY DRT    Staphylococcus aureus (BCID) DETECTED (A) NOT DETECTED    Comment: Methicillin (oxacillin)-resistant Staphylococcus aureus (MRSA). MRSA is predictably resistant to beta-lactam antibiotics (except ceftaroline). Preferred therapy is vancomycin unless clinically contraindicated. Patient requires contact precautions if  hospitalized. CRITICAL RESULT CALLED TO, READ BACK BY AND VERIFIED WITH: PHARMD FRANK WILSON ON 08/30/21 @ 1943 BY DRT    Staphylococcus epidermidis NOT DETECTED NOT DETECTED   Staphylococcus lugdunensis NOT DETECTED NOT DETECTED   Streptococcus species NOT DETECTED NOT DETECTED   Streptococcus  agalactiae NOT DETECTED  NOT DETECTED   Streptococcus pneumoniae NOT DETECTED NOT DETECTED   Streptococcus pyogenes NOT DETECTED NOT DETECTED   A.calcoaceticus-baumannii NOT DETECTED NOT DETECTED   Bacteroides fragilis NOT DETECTED NOT DETECTED   Enterobacterales NOT DETECTED NOT DETECTED   Enterobacter cloacae complex NOT DETECTED NOT DETECTED   Escherichia coli NOT DETECTED NOT DETECTED   Klebsiella aerogenes NOT DETECTED NOT DETECTED   Klebsiella oxytoca NOT DETECTED NOT DETECTED   Klebsiella pneumoniae NOT DETECTED NOT DETECTED   Proteus species NOT DETECTED NOT DETECTED   Salmonella species NOT DETECTED NOT DETECTED   Serratia marcescens NOT DETECTED NOT DETECTED   Haemophilus influenzae NOT DETECTED NOT DETECTED   Neisseria meningitidis NOT DETECTED NOT DETECTED   Pseudomonas aeruginosa NOT DETECTED NOT DETECTED   Stenotrophomonas maltophilia NOT DETECTED NOT DETECTED   Candida albicans NOT DETECTED NOT DETECTED   Candida auris NOT DETECTED NOT DETECTED   Candida glabrata NOT DETECTED NOT DETECTED   Candida krusei NOT DETECTED NOT DETECTED   Candida parapsilosis NOT DETECTED NOT DETECTED   Candida tropicalis NOT DETECTED NOT DETECTED   Cryptococcus neoformans/gattii NOT DETECTED NOT DETECTED   Meth resistant mecA/C and MREJ DETECTED (A) NOT DETECTED    Comment: CRITICAL RESULT CALLED TO, READ BACK BY AND VERIFIED WITH: PHARMD FRANK WILSON ON 08/30/21 @ 1943 BY DRT Performed at [facility], [address], [place], [place] [address]   Lactic acid, plasma     Status: None   Collection Time: 08/29/21 10:30 PM  Result Value Ref Range   Lactic Acid, Venous 1.2 0.5 - 1.9 mmol/L    Comment: Performed at [facility], [address] [place], [place] [address]  Urine Culture     Status: Abnormal   Collection Time: 08/29/21 11:11 PM   Specimen: Urine, Clean Catch  Result Value Ref Range   Specimen Description URINE, CLEAN CATCH    Special Requests NONE    Culture (A)      <10,000 COLONIES/mL INSIGNIFICANT GROWTH Performed at [facility], [address] [place], [place] [address]    Report Status 08/30/2021 FINAL   CBC with Differential     Status: Abnormal   Collection Time: 08/30/21 12:04 AM  Result Value Ref Range   WBC 13.9 (H) 4.0 - 10.5 K/uL   RBC 4.73 4.22 - 5.81 MIL/uL   Hemoglobin 15.1 13.0 - 17.0 g/dL   HCT 13.0 86.5 - 78.4 %   MCV 92.6 80.0 - 100.0 fL   MCH 31.9 26.0 - 34.0 pg   MCHC 34.5 30.0 - 36.0 g/dL   RDW 69.6 29.5 - 28.4 %   Platelets 203 150 - 400 K/uL   nRBC 0.0 0.0 - 0.2 %   Neutrophils Relative % 88 %   Neutro Abs 12.3 (H) 1.7 - 7.7 K/uL   Lymphocytes Relative 5 %   Lymphs Abs 0.7 0.7 - 4.0 K/uL   Monocytes Relative 5 %   Monocytes Absolute 0.7 0.1 - 1.0 K/uL   Eosinophils Relative 0 %   Eosinophils Absolute 0.0 0.0 - 0.5 K/uL   Basophils Relative 1 %   Basophils Absolute 0.1 0.0 - 0.1 K/uL   Immature Granulocytes 1 %   Abs Immature Granulocytes 0.08 (H) 0.00 - 0.07 K/uL    Comment: Performed at [facility], [address] [place], [place] [address]  Protime-INR     Status: None   Collection Time: 08/30/21 12:04 AM  Result Value Ref Range  Prothrombin Time 14.8 11.4 - 15.2 seconds   INR 1.2 0.8 - 1.2    Comment: (NOTE) INR goal varies based on device and disease states. Performed at [facility], [address] [place], [place] [address]   APTT     Status: None   Collection Time: 08/30/21 12:04 AM  Result Value Ref Range   aPTT 33 24 - 36 seconds    Comment: Performed at [facility], [address] [place], [place] [address]  Procalcitonin     Status: None   Collection Time: 08/30/21 12:04 AM  Result Value Ref Range   Procalcitonin 0.40 ng/mL    Comment:        Interpretation: PCT (Procalcitonin) <= 0.5 ng/mL: Systemic infection (sepsis) is not likely. Local bacterial infection is possible. (NOTE)       Sepsis PCT Algorithm           Lower Respiratory Tract                                       Infection PCT Algorithm    ----------------------------     ----------------------------         PCT < 0.25 ng/mL                PCT < 0.10 ng/mL          Strongly encourage             Strongly discourage   discontinuation of antibiotics    initiation of antibiotics    ----------------------------     -----------------------------       PCT 0.25 - 0.50 ng/mL            PCT 0.10 - 0.25 ng/mL               OR       >80% decrease in PCT            Discourage initiation of                                            antibiotics      Encourage discontinuation           of antibiotics    ----------------------------     -----------------------------         PCT >= 0.50 ng/mL              PCT 0.26 - 0.50 ng/mL               AND        <80% decrease in PCT             Encourage initiation of                                             antibiotics       Encourage continuation           of antibiotics    ----------------------------     -----------------------------        PCT >= 0.50 ng/mL  PCT > 0.50 ng/mL               AND         increase in PCT                  Strongly encourage                                      initiation of antibiotics    Strongly encourage escalation           of antibiotics                                     -----------------------------                                           PCT <= 0.25 ng/mL                                                 OR                                        > 80% decrease in PCT                                      Discontinue / Do not initiate                                             antibiotics  Performed at [facility], [address], [place], [place] [address]   Hemoglobin A1c     Status: Abnormal   Collection Time: 08/30/21 12:04 AM  Result Value Ref Range   Hgb A1c MFr Bld 11.2 (H) 4.8 - 5.6 %    Comment: (NOTE) Pre diabetes:          5.7%-6.4%  Diabetes:               >6.4%  Glycemic control for   <7.0% adults with diabetes    Mean Plasma Glucose 274.74 mg/dL    Comment: Performed at [facility], [address] [place], [place] [address]  Respiratory (~20 pathogens) panel by PCR     Status: None   Collection Time: 08/30/21 12:04 AM   Specimen: Urine, Clean Catch; Respiratory  Result Value Ref Range   Adenovirus NOT DETECTED NOT DETECTED   Coronavirus 229E NOT DETECTED NOT DETECTED    Comment: (NOTE) The Coronavirus on the Respiratory Panel, DOES NOT test for the novel  Coronavirus (2019 nCoV)    Coronavirus HKU1 NOT DETECTED NOT DETECTED   Coronavirus NL63 NOT DETECTED NOT DETECTED   Coronavirus OC43 NOT DETECTED NOT DETECTED   Metapneumovirus NOT DETECTED NOT DETECTED   Rhinovirus / Enterovirus NOT DETECTED NOT DETECTED   Influenza A  NOT DETECTED NOT DETECTED   Influenza B NOT DETECTED NOT DETECTED   Parainfluenza Virus 1 NOT DETECTED NOT DETECTED   Parainfluenza Virus 2 NOT DETECTED NOT DETECTED   Parainfluenza Virus 3 NOT DETECTED NOT DETECTED   Parainfluenza Virus 4 NOT DETECTED NOT DETECTED   Respiratory Syncytial Virus NOT DETECTED NOT DETECTED   Bordetella pertussis NOT DETECTED NOT DETECTED   Bordetella Parapertussis NOT DETECTED NOT DETECTED   Chlamydophila pneumoniae NOT DETECTED NOT DETECTED   Mycoplasma pneumoniae NOT DETECTED NOT DETECTED    Comment: Performed at [facility], [address], [place], [place] [address]  CK     Status: Abnormal   Collection Time: 08/30/21 12:04 AM  Result Value Ref Range   Total CK 22 (L) 49 - 397 U/L    Comment: Performed at [facility], [address], [place], [place] [address]  Creatinine, urine, random     Status: None   Collection Time: 08/30/21 12:04 AM  Result Value Ref Range   Creatinine, Urine 38.98 mg/dL    Comment: Performed at [facility], [address] [place], [place] [address]  Phosphorus     Status: None   Collection Time: 08/30/21 12:04 AM   Result Value Ref Range   Phosphorus 2.6 2.5 - 4.6 mg/dL    Comment: Performed at [facility], [address] [place], [place] [address]  Osmolality     Status: None   Collection Time: 08/30/21 12:04 AM  Result Value Ref Range   Osmolality 290 275 - 295 mOsm/kg    Comment: Performed at [facility], [address], [place], [place] [address]  Sodium, urine, random     Status: None   Collection Time: 08/30/21 12:04 AM  Result Value Ref Range   Sodium, Ur 25 mmol/L    Comment: Performed at [facility], [address] [place], [place] [address]  TSH     Status: None   Collection Time: 08/30/21 12:04 AM  Result Value Ref Range   TSH 0.837 0.350 - 4.500 uIU/mL    Comment: Performed by a 3rd Generation assay with a functional sensitivity of <=0.01 uIU/mL. Performed at [facility], [address] [place], [place] [address]   Digoxin level     Status: Abnormal   Collection Time: 08/30/21 12:04 AM  Result Value Ref Range   Digoxin Level <0.2 (L) 0.8 - 2.0 ng/mL    Comment: RESULTS CONFIRMED BY MANUAL DILUTION Performed at [facility], [address] [name], [place] [address]   Lactate dehydrogenase     Status: None   Collection Time: 08/30/21 12:04 AM  Result Value Ref Range   LDH 142 98 - 192 U/L    Comment: Performed at [facility], [address], [place], [place] [address]  Basic metabolic panel     Status: Abnormal   Collection Time: 08/30/21 12:04 AM  Result Value Ref Range   Sodium 128 (L) 135 - 145 mmol/L   Potassium 4.0 3.5 - 5.1 mmol/L   Chloride 96 (L) 98 - 111 mmol/L   CO2 22 22 - 32 mmol/L   Glucose, Bld 268 (H) 70 - 99 mg/dL    Comment: Glucose reference range applies only to samples taken after fasting for at least 8 hours.   BUN 19 6 - 20 mg/dL   Creatinine, Ser 7.62 (H) 0.61 - 1.24 mg/dL   Calcium 8.8 (  L) 8.9 - 10.3 mg/dL   GFR, Estimated >16 >10 mL/min    Comment: (NOTE) Calculated using the CKD-EPI Creatinine  Equation (2021)    Anion gap 10 5 - 15    Comment: Performed at [facility], [address], [place], [place] [address]  Osmolality, urine     Status: Abnormal   Collection Time: 08/30/21 12:04 AM  Result Value Ref Range   Osmolality, Ur 260 (L) 300 - 900 mOsm/kg    Comment: Performed at [facility], [address] [place], [place], [place] [address]  CBG monitoring, ED     Status: Abnormal   Collection Time: 08/30/21 12:33 AM  Result Value Ref Range   Glucose-Capillary 292 (H) 70 - 99 mg/dL    Comment: Glucose reference range applies only to samples taken after fasting for at least 8 hours.  Magnesium     Status: None   Collection Time: 08/30/21 12:57 AM  Result Value Ref Range   Magnesium 2.0 1.7 - 2.4 mg/dL    Comment: Performed at [facility], [address], [place], [place] [address]  Blood gas, venous     Status: Abnormal   Collection Time: 08/30/21  2:28 AM  Result Value Ref Range   pH, Ven 7.43 7.25 - 7.43   pCO2, Ven 39 (L) 44 - 60 mmHg   pO2, Ven 47 (H) 32 - 45 mmHg   Bicarbonate 25.9 20.0 - 28.0 mmol/L   Acid-Base Excess 1.5 0.0 - 2.0 mmol/L   O2 Saturation 77.3 %   Patient temperature 37.0    Collection site RIGHT HAND    Drawn by [id number]     Comment: Performed at [facility], [address] [place], [place] [address]  Culture, blood (Routine X 2) w Reflex to ID Panel     Status: None (Preliminary result)   Collection Time: 08/30/21  2:32 AM   Specimen: BLOOD RIGHT HAND  Result Value Ref Range   Specimen Description BLOOD RIGHT HAND    Special Requests      BOTTLES DRAWN AEROBIC ONLY Blood Culture results may not be optimal due to an inadequate volume of blood received in culture bottles   Culture      NO GROWTH 1 DAY Performed at [facility], [address] [place], [place] [address]    Report Status PENDING   Prealbumin     Status: Abnormal   Collection Time: 08/30/21  2:32 AM  Result Value Ref Range   Prealbumin 16.6 (L) 18 -  38 mg/dL    Comment: Performed at [facility], [address], [place], [place] [address]  HIV Antibody (routine testing w rflx)     Status: None   Collection Time: 08/30/21  2:32 AM  Result Value Ref Range   HIV Screen 4th Generation wRfx Non Reactive Non Reactive    Comment: Performed at [facility], [address] [place], [place] [address]  Comprehensive metabolic panel     Status: Abnormal   Collection Time: 08/30/21  2:32 AM  Result Value Ref Range   Sodium 129 (L) 135 - 145 mmol/L   Potassium 4.2 3.5 - 5.1 mmol/L   Chloride 98 98 - 111 mmol/L   CO2 20 (L) 22 - 32 mmol/L   Glucose, Bld 247 (H) 70 - 99 mg/dL    Comment: Glucose reference range applies only to samples taken after fasting for at least 8  hours.   BUN 18 6 - 20 mg/dL   Creatinine, Ser 1.61 (H) 0.61 - 1.24 mg/dL   Calcium 9.2 8.9 - 09.6 mg/dL   Total Protein 6.5 6.5 - 8.1 g/dL   Albumin 3.3 (L) 3.5 - 5.0 g/dL   AST 21 15 - 41 U/L   ALT 17 0 - 44 U/L   Alkaline Phosphatase 87 38 - 126 U/L   Total Bilirubin 1.2 0.3 - 1.2 mg/dL   GFR, Estimated >04 >54 mL/min    Comment: (NOTE) Calculated using the CKD-EPI Creatinine Equation (2021)    Anion gap 11 5 - 15    Comment: Performed at [facility], [address] [place], [place] [address]  CBC     Status: Abnormal   Collection Time: 08/30/21  2:32 AM  Result Value Ref Range   WBC 12.3 (H) 4.0 - 10.5 K/uL   RBC 5.10 4.22 - 5.81 MIL/uL   Hemoglobin 16.4 13.0 - 17.0 g/dL   HCT 91.4 78.2 - 95.6 %   MCV 92.4 80.0 - 100.0 fL   MCH 32.2 26.0 - 34.0 pg   MCHC 34.8 30.0 - 36.0 g/dL   RDW 21.3 08.6 - 57.8 %   Platelets 179 150 - 400 K/uL   nRBC 0.0 0.0 - 0.2 %    Comment: Performed at [facility], [address] [place], [place] [address]  Glucose, capillary     Status: Abnormal   Collection Time: 08/30/21  3:36 AM  Result Value Ref Range   Glucose-Capillary 321 (H) 70 - 99 mg/dL    Comment: Glucose reference range applies only to samples  taken after fasting for at least 8 hours.  MRSA Next Gen by PCR, Nasal     Status: Abnormal   Collection Time: 08/30/21  4:33 AM   Specimen: Nasal Mucosa; Nasal Swab  Result Value Ref Range   MRSA by PCR Next Gen DETECTED (A) NOT DETECTED    Comment: RESULT CALLED TO, READ BACK BY AND VERIFIED WITH: T IRBY,RN@[id number]  08/30/21 MK (NOTE) The GeneXpert MRSA Assay (FDA approved for NASAL specimens only), is one component of a comprehensive MRSA colonization surveillance program. It is not intended to diagnose MRSA infection nor to guide or monitor treatment for MRSA infections. Test performance is not FDA approved in patients less than 20 years old. Performed at [facility], [address] [place], [place] [address]   Glucose, capillary     Status: Abnormal   Collection Time: 08/30/21  8:50 AM  Result Value Ref Range   Glucose-Capillary 344 (H) 70 - 99 mg/dL    Comment: Glucose reference range applies only to samples taken after fasting for at least 8 hours.  Glucose, capillary     Status: Abnormal   Collection Time: 08/30/21 11:06 AM  Result Value Ref Range   Glucose-Capillary 318 (H) 70 - 99 mg/dL    Comment: Glucose reference range applies only to samples taken after fasting for at least 8 hours.  Glucose, capillary     Status: Abnormal   Collection Time: 08/30/21  4:10 PM  Result Value Ref Range   Glucose-Capillary 305 (H) 70 - 99 mg/dL    Comment: Glucose reference range applies only to samples taken after fasting for at least 8 hours.  Glucose, capillary     Status: Abnormal   Collection Time: 08/30/21  9:21 PM  Result Value Ref Range   Glucose-Capillary 323 (H) 70 - 99 mg/dL  Comment: Glucose reference range applies only to samples taken after fasting for at least 8 hours.  Culture, blood (Routine X 2) w Reflex to ID Panel     Status: None (Preliminary result)   Collection Time: 08/30/21 10:30 PM   Specimen: BLOOD RIGHT HAND  Result Value Ref Range   Specimen  Description BLOOD RIGHT HAND    Special Requests AEROBIC BOTTLE ONLY Blood Culture adequate volume    Culture      NO GROWTH < 12 HOURS Performed at [facility], [address] [place], [place] [address]    Report Status PENDING   Culture, blood (Routine X 2) w Reflex to ID Panel     Status: None (Preliminary result)   Collection Time: 08/30/21 10:30 PM   Specimen: BLOOD LEFT HAND  Result Value Ref Range   Specimen Description BLOOD LEFT HAND    Special Requests AEROBIC BOTTLE ONLY Blood Culture adequate volume    Culture      NO GROWTH < 12 HOURS Performed at [facility], [address] [place], [place] [address]    Report Status PENDING   CBC     Status: None   Collection Time: 08/31/21  2:24 AM  Result Value Ref Range   WBC 7.2 4.0 - 10.5 K/uL   RBC 4.48 4.22 - 5.81 MIL/uL   Hemoglobin 14.4 13.0 - 17.0 g/dL   HCT 09.8 11.9 - 14.7 %   MCV 93.1 80.0 - 100.0 fL   MCH 32.1 26.0 - 34.0 pg   MCHC 34.5 30.0 - 36.0 g/dL   RDW 82.9 56.2 - 13.0 %   Platelets 166 150 - 400 K/uL   nRBC 0.0 0.0 - 0.2 %    Comment: Performed at [facility], [address] [place], [place] [address]  Basic metabolic panel     Status: Abnormal   Collection Time: 08/31/21  2:24 AM  Result Value Ref Range   Sodium 130 (L) 135 - 145 mmol/L   Potassium 3.8 3.5 - 5.1 mmol/L   Chloride 104 98 - 111 mmol/L   CO2 19 (L) 22 - 32 mmol/L   Glucose, Bld 250 (H) 70 - 99 mg/dL    Comment: Glucose reference range applies only to samples taken after fasting for at least 8 hours.   BUN 22 (H) 6 - 20 mg/dL   Creatinine, Ser 4.69 (H) 0.61 - 1.24 mg/dL   Calcium 8.6 (L) 8.9 - 10.3 mg/dL   GFR, Estimated >62 >95 mL/min    Comment: (NOTE) Calculated using the CKD-EPI Creatinine Equation (2021)    Anion gap 7 5 - 15    Comment: Performed at [facility], [address] [place], [place] [address]  Glucose, capillary     Status: Abnormal   Collection Time: 08/31/21  6:08 AM  Result Value  Ref Range   Glucose-Capillary 265 (H) 70 - 99 mg/dL    Comment: Glucose reference range applies only to samples taken after fasting for at least 8 hours.    CT HEAD WO CONTRAST ( )  Result Date: 08/29/2021 CLINICAL DATA:  Headache, chronic, new features or increased frequency EXAM: CT HEAD WITHOUT CONTRAST TECHNIQUE: Contiguous axial images were obtained from the base of the skull through the vertex without intravenous contrast. RADIATION DOSE REDUCTION: This exam was performed according to the departmental dose-optimization program which includes automated exposure control, adjustment of the mA and/or kV according to patient size and/or use  of iterative reconstruction technique. COMPARISON:  None Available. FINDINGS: Brain: No acute intracranial abnormality. Specifically, no hemorrhage, hydrocephalus, mass lesion, acute infarction, or significant intracranial injury. Vascular: No hyperdense vessel or unexpected calcification. Skull: No acute calvarial abnormality. Sinuses/Orbits: No acute findings Other: None IMPRESSION: No acute intracranial abnormality. Electronically Signed   By: Charlett Nose M.D.   On: 08/29/2021 22:28   MR FOOT RIGHT WO CONTRAST  Result Date: 08/31/2021 CLINICAL DATA:  Osteomyelitis, foot Presented with ulcer on the right second toe. Rule out osteomyelitis EXAM: MRI OF THE RIGHT FOREFOOT WITHOUT CONTRAST TECHNIQUE: Multiplanar, multisequence MR imaging of the right forefoot was performed. No intravenous contrast was administered. COMPARISON:  X-ray 08/30/2021 FINDINGS: Bones/Joint/Cartilage Bone marrow edema with confluent low T1 signal changes of the distal phalanx of the fourth toe compatible with acute osteomyelitis. Suspected erosion of the distal tuft. Mild bone marrow edema within the middle phalanx of the fourth toe. Trace fourth toe DIP and PIP effusions, which may be reactive or represent septic arthritis. Mild bone marrow edema within the distal tuft of the great toe  distal phalanx without definite erosion. Remaining osseous structures appear within normal limits. No additional sites of osteomyelitis are identified. No acute fracture or dislocation. Joint spaces of the forefoot are preserved. Ligaments Intact Lisfranc ligament. Collateral ligaments of the forefoot appear intact. Muscles and Tendons Diffuse edema-like signal throughout the intrinsic foot musculature may reflect changes related to denervation and/or myositis. Intact flexor and extensor tendons. Soft tissues Soft tissue wound or ulceration at the distal aspect of the fourth toe. Soft tissue swelling of the fourth toe with a 14 x 6 x 11 mm fluid collection adjacent to the fourth toe distal phalanx. IMPRESSION: 1. Soft tissue wound or ulceration at the distal aspect of the fourth toe with acute osteomyelitis of the distal phalanx. Mild bone marrow edema within the middle phalanx of the fourth toe, reactive osteitis versus early acute osteomyelitis. 2. 14 mm abscess within the soft tissues adjacent to the fourth toe distal phalanx. 3. Trace fourth toe DIP and PIP effusions, which may be reactive or represent septic arthritis. 4. Mild bone marrow edema within the distal tuft of the great toe distal phalanx without definite erosion. Findings may be reactive or represent early osteomyelitis. 5. Diffuse edema-like signal throughout the intrinsic foot musculature may reflect changes related to denervation and/or myositis. Electronically Signed   By: Duanne Guess D.O.   On: 08/31/2021 09:40   CT CHEST ABDOMEN PELVIS W CONTRAST  Result Date: 08/29/2021 CLINICAL DATA:  Sepsis EXAM: CT CHEST, ABDOMEN, AND PELVIS WITH CONTRAST TECHNIQUE: Multidetector CT imaging of the chest, abdomen and pelvis was performed following the standard protocol during bolus administration of intravenous contrast. RADIATION DOSE REDUCTION: This exam was performed according to the departmental dose-optimization program which includes automated  exposure control, adjustment of the mA and/or kV according to patient size and/or use of iterative reconstruction technique. CONTRAST:  OMNIPAQUE IOHEXOL 300 MG/ML  SOLN COMPARISON:  None Available. FINDINGS: CT CHEST FINDINGS Cardiovascular: Diffuse coronary artery calcifications. Scattered aortic calcifications. Heart is normal size. Aorta is normal caliber. Mediastinum/Nodes: No mediastinal, hilar, or axillary adenopathy. Trachea and esophagus are unremarkable. Thyroid unremarkable. Lungs/Pleura: Lungs are clear. No focal airspace opacities or suspicious nodules. No effusions. Musculoskeletal: Chest wall soft tissues are unremarkable. No acute bony abnormality. CT ABDOMEN PELVIS FINDINGS Hepatobiliary: No focal hepatic abnormality. Gallbladder unremarkable. Pancreas: No focal abnormality or ductal dilatation. Spleen: No focal abnormality.  Normal size. Adrenals/Urinary Tract: No  adrenal abnormality. No focal renal abnormality. No stones or hydronephrosis. Urinary bladder is unremarkable. Stomach/Bowel: Stomach, large and small bowel grossly unremarkable. Vascular/Lymphatic: Aortoiliac atherosclerosis. No evidence of aneurysm or adenopathy. Reproductive: No visible focal abnormality. Other: No free fluid or free air. Musculoskeletal: No acute bony abnormality. IMPRESSION: No acute findings in the chest, abdomen or pelvis. Diffuse coronary artery disease.  Aortic atherosclerosis. Electronically Signed   By: Charlett Nose M.D.   On: 08/29/2021 22:31   DG Chest Portable 1 View  Result Date: 08/29/2021 CLINICAL DATA:  Shortness of breath EXAM: PORTABLE CHEST 1 VIEW COMPARISON:  10/17/2018 FINDINGS: Lungs are clear.  No pleural effusion or pneumothorax. The heart is normal in size. IMPRESSION: No evidence of acute cardiopulmonary disease. Electronically Signed   By: Charline Bills M.D.   On: 08/29/2021 18:31   DG Foot 2 Views Right  Result Date: 08/30/2021 CLINICAL DATA:  Foot pain EXAM: RIGHT FOOT - 2  VIEW COMPARISON:  None Available. FINDINGS: No fracture or dislocation is seen. The joint spaces are preserved. Visualized soft tissues are within normal limits. Moderate plantar calcaneal enthesophyte. IMPRESSION: Negative. Electronically Signed   By: Charline Bills M.D.   On: 08/30/2021 00:35   Inpatient: VAS [place] PAD ABI  Result Date: 08/31/2021  LOWER EXTREMITY DOPPLER STUDY Patient Name:  GUSS FARRUGGIA  Date of Exam:   08/30/2021 Medical Rec #: [id number]    Accession #:    [id number] Date of Birth: [birth year]     Patient Gender: M Patient Age:   32 years Exam Location:  [facility] Procedure:      VAS [place] ABI WITH/WO TBI Referring Phys: Jonny Ruiz DOUTOVA --------------------------------------------------------------------------------  Indications: Ulceration. High Risk Factors: Hypertension, Diabetes.  Comparison Study: No prior study Performing Technologist: Gertie Fey MHA, RVT, RDCS, RDMS  Examination Guidelines: A complete evaluation includes at minimum, Doppler waveform signals and systolic blood pressure reading at the level of bilateral brachial, anterior tibial, and posterior tibial arteries, when vessel segments are accessible. Bilateral testing is considered an integral part of a complete examination. Photoelectric Plethysmograph (PPG) waveforms and toe systolic pressure readings are included as required and additional duplex testing as needed. Limited examinations for reoccurring indications may be performed as noted.  ABI Findings: +---------+------------------+-----+----------+--------+ Right    Rt Pressure (mmHg)IndexWaveform  Comment  +---------+------------------+-----+----------+--------+ Brachial 168                    triphasic          +---------+------------------+-----+----------+--------+ PTA      137               0.82 monophasic         +---------+------------------+-----+----------+--------+ DP       136               0.81 monophasic          +---------+------------------+-----+----------+--------+ Great Toe                       Absent             +---------+------------------+-----+----------+--------+ +---------+------------------+-----+----------+-------+ Left     Lt Pressure (mmHg)IndexWaveform  Comment +---------+------------------+-----+----------+-------+ Brachial 158                    triphasic         +---------+------------------+-----+----------+-------+ PTA      127               0.76 monophasic        +---------+------------------+-----+----------+-------+  DP       113               0.67 monophasic        +---------+------------------+-----+----------+-------+ Great Toe84                0.50                   +---------+------------------+-----+----------+-------+ +-------+-----------+-----------+------------+------------+ ABI/TBIToday's ABIToday's TBIPrevious ABIPrevious TBI +-------+-----------+-----------+------------+------------+ Right  0.82       0.00                                +-------+-----------+-----------+------------+------------+ Left   0.76       0.50                                +-------+-----------+-----------+------------+------------+  Summary: Right: Resting right ankle-brachial index indicates mild right lower extremity arterial disease. Absent great toe PPG waveform. Left: Resting left ankle-brachial index indicates moderate left lower extremity arterial disease. The left toe-brachial index is abnormal. Guidlines:  Patients with an ABI >1.2 or <0.9 have a high risk of peripheral atherosclerosis. Patients with asymptomatic peripheral atherosclerosis can be managed without further referral, at the discretion of the ordering provider. Guidelines for medical therapy include the following: - Patients with ABI evidence of peripheral atherosclerosis and symptoms of ischemic rest pain, with or without lower extremities wounds should be referred to a vascular  specialist for urgent evaluation. The following therapies are recommendations from the Society of Vascular Surgery for patients with peripheral arterial disease: _ Multidisciplinary comprehensive smoking cessation interventions. _ Blood glucose control with goal A1c < 7%. _ Blood pressure control, < 140/90 mmHg. _ Statin therapy, goal LDL-C at least <100 mg/dL. _ Supervised exercise program (30-60 min of walking, three times a week) for patients with intermittent claudication. _ Aspirin  by mouth daily  *See table(s) above for measurements and observations.  Electronically signed by Waverly Ferrari MD on 08/31/2021 at 10:52:34 AM.    Final     Review of Systems  Constitutional:  Positive for chills, diaphoresis, fatigue and fever.  HENT:  Negative for ear discharge, ear pain, hearing loss and tinnitus.   Eyes:  Negative for photophobia and pain.  Respiratory:  Negative for cough and shortness of breath.   Cardiovascular:  Negative for chest pain.  Gastrointestinal:  Negative for abdominal pain, nausea and vomiting.  Genitourinary:  Negative for dysuria, flank pain, frequency and urgency.  Musculoskeletal:  Negative for arthralgias, back pain, myalgias and neck pain.  Neurological:  Negative for dizziness and headaches.  Hematological:  Does not bruise/bleed easily.  Psychiatric/Behavioral:  The patient is not nervous/anxious.   Blood pressure 121/88, pulse 91, temperature 98.7 F (37.1 C), temperature source Oral, resp. rate 20, height 6' (1.829 m), weight 80.8 kg, SpO2 97 %. Physical Exam Constitutional:      General: He is not in acute distress.    Appearance: He is well-developed. He is not diaphoretic.  HENT:     Head: Normocephalic and atraumatic.  Eyes:     General: No scleral icterus.       Right eye: No discharge.        Left eye: No discharge.     Conjunctiva/sclera: Conjunctivae normal.  Cardiovascular:     Rate and Rhythm: Normal rate and regular rhythm.  Pulmonary:  Effort: Pulmonary effort is normal. No respiratory distress.  Musculoskeletal:     Cervical back: Normal range of motion.  Feet:     Comments: Right foot: Ulceration tip of 4th toe, mild fusiform edema and erythema, sensation generally absent (chronic), 0 DP/PT. Skin:    General: Skin is warm and dry.  Neurological:     Mental Status: He is alert.  Psychiatric:        Mood and Affect: Mood normal.        Behavior: Behavior normal.    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 [name] to evaluate, likely toe amputation tomorrow. Will also need vascular surgery consultation for his PAD.    Freeman Caldron, PA-C Orthopedic Surgery [phone] 08/31/2021, 11:18 AM

## 2021-08-31 NOTE — Progress Notes (Signed)
Inpatient Diabetes Program Recommendations  AACE/ADA: New Consensus Statement on Inpatient Glycemic Control   Target Ranges:  Prepandial:   less than 140 mg/dL      Peak postprandial:   less than 180 mg/dL (1-2 hours)      Critically ill patients:  140 - 180 mg/dL    Latest Reference Range & Units 08/30/21 08:50 08/30/21 11:06 08/30/21 16:10 08/30/21 21:21 08/31/21 06:08  Glucose-Capillary 70 - 99 mg/dL 751 (H) 700 (H) 174 (H) 323 (H) 265 (H)   Review of Glycemic Control  Diabetes history: DM2 Outpatient Diabetes medications: Basaglar 16 units BID, Novolog 10-12 units TID Current orders for Inpatient glycemic control: Semglee 25 units QHS, Novolog 0-15 units TID with meals, Novolog 0-5 units QHS  Inpatient Diabetes Program Recommendations:    Insulin: Please consider increasing Semglee to 30 units QHS and ordering Novolog 5 units TID with meals for meal coverage if patient eats at least 50% of meals.  Thanks, Orlando Penner, RN, MSN, CDCES Diabetes Coordinator Inpatient Diabetes Program [phone] (Team Pager from 8am to 5pm)

## 2021-08-31 NOTE — Progress Notes (Signed)
ANTICOAGULATION CONSULT NOTE - Initial Consult  Pharmacy Consult for heparin (while holding Eliquis) Indication: atrial fibrillation  No Known Allergies  Patient Measurements: Height: 6' (182.9 cm) Weight: 80.8 kg (178 lb 2.1 oz) IBW/kg (Calculated) : 77.6 Heparin Dosing Weight: 80 kg  Vital Signs: Temp: 98.7 F (37.1 C) (05/30 0735) Temp Source: Oral (05/30 0735) BP: 121/88 (05/30 0735) Pulse Rate: 91 (05/30 0735)  Labs: Recent Labs    08/30/21 0004 08/30/21 0232 08/31/21 0224  HGB 15.1 16.4 14.4  HCT 43.8 47.1 41.7  PLT 203 179 166  APTT 33  --   --   LABPROT 14.8  --   --   INR 1.2  --   --   CREATININE 1.30* 1.30* 1.35*  CKTOTAL 22*  --   --     Estimated Creatinine Clearance: 64.7 mL/min (A) (by C-G formula based on SCr of 1.35 mg/dL (H)).   Medical History: Past Medical History:  Diagnosis Date   Cardiomyopathy ([place])    a. EF 45% in 2019.   CHF (congestive heart failure) (HCC)    Chronic anticoagulation 09/30/2017   Diabetes mellitus type 2 in nonobese [facility])    Does not have health insurance    Essential hypertension 09/26/2017   Financial difficulties    H/O noncompliance with medical treatment, presenting hazards to health    Non-insulin treated type 2 diabetes mellitus ([place]) 09/26/2017   Persistent atrial fibrillation (HCC)    Sleep apnea suspected 09/30/2017    Medications:  Medications Prior to Admission  Medication Sig Dispense Refill Last Dose   acetaminophen (TYLENOL) 500 MG tablet Take 500 mg by mouth every 6 (six) hours as needed for moderate pain or headache.   08/29/2021   apixaban (ELIQUIS) 5 MG TABS tablet Take 1 tablet (5 mg total) by mouth 2 (two) times daily. 180 tablet 1 08/28/2021 at 220   carvedilol (COREG) 25 MG tablet Take 1 tablet (25 mg total) by mouth 2 (two) times daily. 60 tablet 2 08/29/2021 at 1530   cetirizine (ZYRTEC) 10 MG tablet Take 10 mg by mouth at bedtime as needed for allergies.   Past Week   digoxin (LANOXIN)  0.125 MG tablet Take 1 tablet (125 mcg total) by mouth daily. 30 tablet 0 08/28/2021   fluticasone (FLONASE) 50 MCG/ACT nasal spray Place 2 sprays into both nostrils daily. 16 g 6 08/28/2021   furosemide (LASIX) 40 MG tablet Take 1 tablet (40 mg total) by mouth daily. 30 tablet 2 08/28/2021   insulin aspart (NOVOLOG) 100 UNIT/ML FlexPen Inject 6 Units into the skin in the morning, at noon, in the evening, and at bedtime. (Patient taking differently: Inject 10-12 Units into the skin in the morning and at bedtime.) 6 mL 6 08/29/2021   Insulin Glargine (BASAGLAR KWIKPEN) 100 UNIT/ML Inject 20 Units into the skin daily. (Patient taking differently: Inject 16 Units into the skin at bedtime.) 6 mL 1 08/28/2021   naproxen sodium (ALEVE) 220 MG tablet Take 440 mg by mouth daily as needed (pain).   08/28/2021   sacubitril-valsartan (ENTRESTO) 49-51 MG Take 1 tablet by mouth 2 (two) times daily. 60 tablet 1 08/28/2021   spironolactone (ALDACTONE) 25 MG tablet Take 1 tablet (25 mg total) by mouth daily. 90 tablet 3 08/28/2021   TRUEplus Lancets 28G MISC Use as instructed. Check blood glucose level by fingerstick 3 times per day. 200 each 3     Assessment: David [name] is a 61 year old male with history of chronic A-fib  on Eliquis. Eliquis currently discontinued in preparation for arteriogram planned on Thursday. Last dose of Eliquis given 5/30 @ 1001  Pharmacy consulted to start heparin as a bridge while Eliquis is stopped. CBC wnl.   Goal of Therapy:  Heparin level 0.3-0.7 units/ml Monitor platelets by anticoagulation protocol: Yes   Plan:  On 5/30 @ 2200, start heparin infusion at 1250 units/hr Check aPTT and anti-Xa level after 6 hours and daily while on heparin Continue to monitor H&H and platelets    Thank you for allowing [place] to participate in this patients care. Jens Som, PharmD 08/31/2021 1:33 PM  **Pharmacist phone directory can be found on [email or web address] listed under [place]**

## 2021-09-01 DIAGNOSIS — M869 Osteomyelitis, unspecified: Secondary | ICD-10-CM

## 2021-09-01 DIAGNOSIS — I96 Gangrene, not elsewhere classified: Secondary | ICD-10-CM

## 2021-09-01 LAB — BASIC METABOLIC PANEL
Anion gap: 6 (ref 5–15)
BUN: 18 mg/dL (ref 6–20)
CO2: 20 mmol/L — ABNORMAL LOW (ref 22–32)
Calcium: 8.8 mg/dL — ABNORMAL LOW (ref 8.9–10.3)
Chloride: 104 mmol/L (ref 98–111)
Creatinine, Ser: 1.14 mg/dL (ref 0.61–1.24)
GFR, Estimated: >60 mL/min (ref >60)
Glucose, Bld: 189 mg/dL — ABNORMAL HIGH (ref 70–99)
Potassium: 3.7 mmol/L (ref 3.5–5.1)
Sodium: 130 mmol/L — ABNORMAL LOW (ref 135–145)

## 2021-09-01 LAB — LIPID PANEL
Cholesterol: 186 mg/dL (ref 0–200)
HDL: 25 mg/dL — ABNORMAL LOW (ref >40)
LDL Cholesterol: 122 mg/dL — ABNORMAL HIGH (ref 0–99)
Total CHOL/HDL Ratio: 7.4 RATIO
Triglycerides: 194 mg/dL — ABNORMAL HIGH (ref <150)
VLDL: 39 mg/dL (ref 0–40)

## 2021-09-01 LAB — CULTURE, BLOOD (ROUTINE X 2): Special Requests: ADEQUATE

## 2021-09-01 LAB — GLUCOSE, CAPILLARY
Glucose-Capillary: 153 mg/dL — ABNORMAL HIGH (ref 70–99)
Glucose-Capillary: 235 mg/dL — ABNORMAL HIGH (ref 70–99)
Glucose-Capillary: 317 mg/dL — ABNORMAL HIGH (ref 70–99)
Glucose-Capillary: 210 mg/dL — ABNORMAL HIGH (ref 70–99)

## 2021-09-01 LAB — APTT
aPTT: 64 seconds — ABNORMAL HIGH (ref 24–36)
aPTT: 78 seconds — ABNORMAL HIGH (ref 24–36)

## 2021-09-01 LAB — HEPARIN LEVEL (UNFRACTIONATED)
Heparin Unfractionated: 1.04 IU/mL — ABNORMAL HIGH (ref 0.30–0.70)
Heparin Unfractionated: 0.94 IU/mL — ABNORMAL HIGH (ref 0.30–0.70)

## 2021-09-01 MED ORDER — ATORVASTATIN CALCIUM 80 MG PO TABS
80.0000 mg | ORAL_TABLET | Freq: Every day | ORAL | Status: DC
  Administered 2021-09-01 – 2021-09-06 (×5): 80 mg via ORAL
  Filled 2021-09-01 (×6): qty 1

## 2021-09-01 MED ORDER — CHLORHEXIDINE GLUCONATE CLOTH 2 % EX PADS
6.0000 | MEDICATED_PAD | Freq: Every day | CUTANEOUS | Status: AC
  Administered 2021-09-01 – 2021-09-05 (×4): 6 via TOPICAL

## 2021-09-01 MED ORDER — VANCOMYCIN HCL IN DEXTROSE 1-5 GM/200ML-% IV SOLN
1000.0000 mg | Freq: Two times a day (BID) | INTRAVENOUS | Status: DC
  Administered 2021-09-01 – 2021-09-04 (×7): 1000 mg via INTRAVENOUS
  Filled 2021-09-01 (×9): qty 200

## 2021-09-01 MED ORDER — MUPIROCIN 2 % EX OINT
1.0000 "application " | TOPICAL_OINTMENT | Freq: Two times a day (BID) | CUTANEOUS | Status: AC
  Administered 2021-09-01 – 2021-09-05 (×9): 1 via NASAL
  Filled 2021-09-01 (×3): qty 22

## 2021-09-01 NOTE — Progress Notes (Addendum)
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   87.0 ml 42.89 ml/m LA Biplane Vol: 79.8 ml 39.34 ml/m  AORTIC VALVE                     PULMONIC VALVE AV Area (Vmax):    2.15 cm      PV Vmax:       0.90 m/s AV Area (Vmean):   2.21 cm      PV Peak grad:  3.2 mmHg AV Area (VTI):     2.45 cm AV Vmax:           138.00 cm/s AV Vmean:          101.000 cm/s AV VTI:            0.242 m AV Peak Grad:      7.6 mmHg AV Mean Grad:      4.0 mmHg LVOT Vmax:         94.60 cm/s LVOT Vmean:        71.200 cm/s LVOT VTI:          0.189 m LVOT/AV VTI ratio: 0.78  AORTA Ao Root diam: 4.00 cm Ao Asc diam:  3.80 cm MITRAL VALVE                TRICUSPID VALVE MV Area (PHT): 5.97 cm     TR Peak grad:   23.4 mmHg MV Decel Time: 127 msec     TR Vmax:        242.00 cm/s MV E velocity: 105.00 cm/s                             SHUNTS  Systemic VTI:  0.19 m                             Systemic Diam: 2.00 cm Dorris Carnes MD Electronically signed by Dorris Carnes MD Signature Date/Time: 08/31/2021/1:36:22 PM    Final    Inpatient: VAS [place] PAD ABI  Result Date: 08/31/2021  LOWER EXTREMITY DOPPLER STUDY Patient Name:  CHRISTOPHERJOSE JANOSIK  Date of Exam:   08/30/2021 Medical Rec #: [id number]    Accession #:    [id number] Date of Birth: 1960/05/24     Patient Gender: M Patient Age:   54 years Exam Location:  [facility] Procedure:      VAS [place] ABI WITH/WO TBI Referring Phys: Nyoka Lint DOUTOVA --------------------------------------------------------------------------------  Indications: Ulceration. High Risk Factors: Hypertension, Diabetes.  Comparison Study: No prior study Performing Technologist: Maudry Mayhew MHA, RVT, RDCS, RDMS  Examination Guidelines: A complete evaluation includes at minimum, Doppler waveform signals and systolic blood pressure reading at the level of bilateral brachial, anterior tibial, and posterior tibial arteries, when vessel segments are accessible. Bilateral testing is considered an integral part of a complete examination. Photoelectric Plethysmograph (PPG) waveforms and toe systolic pressure readings are included as required and additional duplex testing as needed. Limited examinations for reoccurring indications may be performed as noted.  ABI Findings: +---------+------------------+-----+----------+--------+ Right    Rt Pressure (mmHg)IndexWaveform  Comment  +---------+------------------+-----+----------+--------+ Brachial 168                    triphasic          +---------+------------------+-----+----------+--------+ PTA      137               0.82 monophasic         +---------+------------------+-----+----------+--------+ DP       136               0.81 monophasic         +---------+------------------+-----+----------+--------+ Great Toe                       Absent              +---------+------------------+-----+----------+--------+ +---------+------------------+-----+----------+-------+ Left     Lt Pressure (mmHg)IndexWaveform  Comment +---------+------------------+-----+----------+-------+ Brachial 158                    triphasic         +---------+------------------+-----+----------+-------+ PTA      127               0.76 monophasic        +---------+------------------+-----+----------+-------+ DP       113               0.67 monophasic        +---------+------------------+-----+----------+-------+ Great Toe84                0.50                   +---------+------------------+-----+----------+-------+ +-------+-----------+-----------+------------+------------+ ABI/TBIToday's ABIToday's TBIPrevious ABIPrevious TBI +-------+-----------+-----------+------------+------------+ Right  0.82       0.00                                +-------+-----------+-----------+------------+------------+ Left   0.76       0.50                                +-------+-----------+-----------+------------+------------+  Summary: Right: Resting right ankle-brachial index indicates mild right lower extremity arterial disease. Absent great toe PPG waveform. Left: Resting left ankle-brachial index indicates moderate left lower extremity arterial disease. The left toe-brachial index is abnormal. Guidlines:  Patients with an ABI >1.2 or <0.9 have a high risk of peripheral atherosclerosis. Patients with asymptomatic peripheral atherosclerosis can be managed without further referral, at the discretion of the ordering provider. Guidelines for medical therapy include the following: - Patients with ABI evidence of peripheral atherosclerosis and symptoms of ischemic rest pain, with or without lower extremities wounds should be referred to a vascular specialist for urgent evaluation. The following therapies are recommendations from the Society of Vascular  Surgery for patients with peripheral arterial disease: _ Multidisciplinary comprehensive smoking cessation interventions. _ Blood glucose control with goal A1c < 7%. _ Blood pressure control, < 140/90 mmHg. _ Statin therapy, goal LDL-C at least <100 mg/dL. _ Supervised exercise program (30-60 min of walking, three times a week) for patients with intermittent claudication. _ Aspirin 81mg  by mouth daily  *See table(s) above for measurements and observations.  Electronically signed by Deitra Mayo MD on 08/31/2021 at 10:52:34 AM.    Final     Scheduled Meds:  atorvastatin  80 mg Oral Daily   carvedilol  3.125 mg Oral BID WC   Chlorhexidine Gluconate Cloth  6 each Topical Q0600   digoxin  125 mcg Oral Daily   feeding supplement (GLUCERNA SHAKE)  237 mL Oral BID BM   insulin aspart  0-15 Units Subcutaneous TID WC   insulin aspart  0-5 Units Subcutaneous QHS   insulin aspart  5 Units Subcutaneous TID WC   insulin glargine-yfgn  30 Units Subcutaneous QHS   loratadine  10 mg Oral Daily   metroNIDAZOLE  500 mg Oral Q12H   mupirocin ointment  1 application. Nasal BID   Continuous Infusions:  cefTRIAXone (ROCEPHIN)  IV 2 g (08/31/21 2111)   heparin 1,400 Units/hr (09/01/21 0805)   vancomycin       LOS: 2 days   Shelly Coss, MD [facility] P5/31/2023, 11:51 AM

## 2021-09-01 NOTE — Progress Notes (Signed)
David [name] for heparin (while holding Eliquis) Indication: atrial fibrillation  No Known Allergies  Patient Measurements: Height: 6' (182.9 cm) Weight: 80.8 kg (178 lb 2.1 oz) IBW/kg (Calculated) : 77.6 Heparin Dosing Weight: 80 kg  Vital Signs: Temp: 98.7 F (37.1 C) (05/31 0414) Temp Source: Oral (05/31 0414) BP: 125/98 (05/31 0414) Pulse Rate: 94 (05/31 0414)  Labs: Recent Labs    08/30/21 0004 08/30/21 0232 08/31/21 0224 09/01/21 0454  HGB 15.1 16.4 14.4  --   HCT 43.8 47.1 41.7  --   PLT 203 179 166  --   APTT 33  --   --  64*  LABPROT 14.8  --   --   --   INR 1.2  --   --   --   HEPARINUNFRC  --   --   --  1.04*  CREATININE 1.30* 1.30* 1.35*  --   CKTOTAL 22*  --   --   --     Estimated Creatinine Clearance: 64.7 mL/min (A) (by C-G formula based on SCr of 1.35 mg/dL (H)).  Medical History: Past Medical History:  Diagnosis Date   Cardiomyopathy ([place])    a. EF 45% in 2019.   CHF (congestive heart failure) (HCC)    Chronic anticoagulation 09/30/2017   Diabetes mellitus type 2 in nonobese [facility])    Does not have health insurance    Essential hypertension 09/26/2017   Financial difficulties    H/O noncompliance with medical treatment, presenting hazards to health    Non-insulin treated type 2 diabetes mellitus ([place]) 09/26/2017   Persistent atrial fibrillation (HCC)    Sleep apnea suspected 09/30/2017   Medications:  Medications Prior to Admission  Medication Sig Dispense Refill Last Dose   acetaminophen (TYLENOL) 500 MG tablet Take 500 mg by mouth every 6 (six) hours as needed for moderate pain or headache.   08/29/2021   apixaban (ELIQUIS) 5 MG TABS tablet Take 1 tablet (5 mg total) by mouth 2 (two) times daily. 180 tablet 1 08/28/2021 at 220   carvedilol (COREG) 25 MG tablet Take 1 tablet (25 mg total) by mouth 2 (two) times daily. 60 tablet 2 08/29/2021 at 1530   cetirizine (ZYRTEC) 10 MG tablet Take 10 mg by mouth at  bedtime as needed for allergies.   Past Week   digoxin (LANOXIN) 0.125 MG tablet Take 1 tablet (125 mcg total) by mouth daily. 30 tablet 0 08/28/2021   fluticasone (FLONASE) 50 MCG/ACT nasal spray Place 2 sprays into both nostrils daily. 16 g 6 08/28/2021   furosemide (LASIX) 40 MG tablet Take 1 tablet (40 mg total) by mouth daily. 30 tablet 2 08/28/2021   insulin aspart (NOVOLOG) 100 UNIT/ML FlexPen Inject 6 Units into the skin in the morning, at noon, in the evening, and at bedtime. (Patient taking differently: Inject 10-12 Units into the skin in the morning and at bedtime.) 6 mL 6 08/29/2021   Insulin Glargine (BASAGLAR KWIKPEN) 100 UNIT/ML Inject 20 Units into the skin daily. (Patient taking differently: Inject 16 Units into the skin at bedtime.) 6 mL 1 08/28/2021   naproxen sodium (ALEVE) 220 MG tablet Take 440 mg by mouth daily as needed (pain).   08/28/2021   sacubitril-valsartan (ENTRESTO) 49-51 MG Take 1 tablet by mouth 2 (two) times daily. 60 tablet 1 08/28/2021   spironolactone (ALDACTONE) 25 MG tablet Take 1 tablet (25 mg total) by mouth daily. 90 tablet 3 08/28/2021   TRUEplus Lancets 28G MISC  Use as instructed. Check blood glucose level by fingerstick 3 times per day. 200 each 3     Assessment: David [name] is a 61 year old male with history of chronic A-fib on Eliquis. Eliquis currently discontinued in preparation for arteriogram planned on Thursday. Last dose of Eliquis given 5/30 @ 1001   aPTT: 64 subtherapeutic on 1250 unit/hr, heparin level not correlating at this time; no infusion issues or signs of overt bleeding; CBC stable  Goal of Therapy:  Heparin level 0.3-0.7 units/ml aPTT: 66-102 seconds Monitor platelets by anticoagulation protocol: Yes   Plan:  Increase heparin gtt to 1400 units/hr Check aPTT and anti-Xa level after 8 hours and daily while on heparin Continue to monitor H&H and platelets   [name], PharmD Clinical Pharmacist 09/01/2021 5:53 AM Please check  AMION for all [place] numbers

## 2021-09-01 NOTE — Consult Note (Signed)
ORTHOPAEDIC CONSULTATION  REQUESTING PHYSICIAN: Burnadette Pop, MD  Chief Complaint: Gangrene and osteomyelitis right foot fourth toe.  HPI: David [name] is a 61 y.o. 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  Patient is an uncontrolled diabetic with atrial fibrillation on anticoagulation therapy.  Past Medical History:  Diagnosis Date   Cardiomyopathy (HCC)    a. EF 45% in 2019.   CHF (congestive heart failure) (HCC)    Chronic anticoagulation 09/30/2017   Diabetes mellitus type 2 in nonobese [facility])    Does not have health insurance    Essential hypertension 09/26/2017   Financial difficulties    H/O noncompliance with medical treatment, presenting hazards to health    Non-insulin treated type 2 diabetes mellitus (HCC) 09/26/2017   Persistent atrial fibrillation (HCC)    Sleep apnea suspected 09/30/2017   Past Surgical History:  Procedure Laterality Date   APPENDECTOMY  1971   CARDIOVERSION N/A 09/28/2017   Procedure: CARDIOVERSION;  Surgeon: Thurmon Fair, MD;  Location: [facility] ENDOSCOPY;  Service: Cardiovascular;  Laterality: N/A;   TEE WITHOUT CARDIOVERSION N/A 09/28/2017   Procedure: TRANSESOPHAGEAL ECHOCARDIOGRAM (TEE);  Surgeon: Thurmon Fair, MD;  Location: [facility] ENDOSCOPY;  Service: Cardiovascular;  Laterality: N/A;   Social History   Socioeconomic History   Marital status: Married    Spouse name: Not on file   Number of children: Not on file   Years of education: Not on file   Highest education level: Not on file  Occupational History   Not on file  Tobacco Use   Smoking status: Former    Years: 15.00    Types: Cigarettes    Quit date: 03/18/2018    Years since quitting: 3.4   Smokeless tobacco: Never   Tobacco comments:    09/26/2017 "2-3 cigarettes/month now"  Vaping Use   Vaping Use: Never used  Substance and Sexual Activity   Alcohol use: Yes    Alcohol/week: 3.0 standard drinks    Types: 3 Cans of beer per  week   Drug use: Never   Sexual activity: Not Currently  Other Topics Concern   Not on file  Social History Narrative   Not on file   Social Determinants of Health   Financial Resource Strain: Not on file  Food Insecurity: Not on file  Transportation Needs: Not on file  Physical Activity: Not on file  Stress: Not on file  Social Connections: Not on file   Family History  Problem Relation Age of Onset   Hypertension Mother    - negative except otherwise stated in the family history section No Known Allergies Prior to Admission medications   Medication Sig Start Date End Date Taking? Authorizing Provider  acetaminophen (TYLENOL) 500 MG tablet Take 500 mg by mouth every 6 (six) hours as needed for moderate pain or headache.   Yes [provider]  apixaban (ELIQUIS) 5 MG TABS tablet Take 1 tablet (5 mg total) by mouth 2 (two) times daily. 05/10/21  Yes Weaver, Scott T, PA-C  carvedilol (COREG) 25 MG tablet Take 1 tablet (25 mg total) by mouth 2 (two) times daily. 07/26/21 09/01/21 Yes [name], MD  cetirizine (ZYRTEC) 10 MG tablet Take 10 mg by mouth at bedtime as needed for allergies.   Yes [provider]  digoxin (LANOXIN) 0.125 MG tablet Take 1 tablet (125 mcg total) by mouth daily. 07/11/21 10/09/21 Yes Newlin, Odette Horns, MD  fluticasone (FLONASE) 50 MCG/ACT nasal spray Place 2 sprays into  both nostrils daily. 01/08/21  Yes Gildardo Pounds, NP  furosemide (LASIX) 40 MG tablet Take 1 tablet (40 mg total) by mouth daily. 07/26/21 09/01/21 Yes Newlin, Charlane Ferretti, MD  insulin aspart (NOVOLOG) 100 UNIT/ML FlexPen Inject 6 Units into the skin in the morning, at noon, in the evening, and at bedtime. Patient taking differently: Inject 10-12 Units into the skin in the morning and at bedtime. 01/08/21 08/29/21 Yes Gildardo Pounds, NP  Insulin Glargine (BASAGLAR KWIKPEN) 100 UNIT/ML Inject 20 Units into the skin daily. Patient taking differently: Inject 16 Units into the skin at  bedtime. 08/04/21  Yes Charlott Rakes, MD  naproxen sodium (ALEVE) 220 MG tablet Take 440 mg by mouth daily as needed (pain).   Yes [provider]  sacubitril-valsartan (ENTRESTO) 49-51 MG Take 1 tablet by mouth 2 (two) times daily. 07/14/21  Yes Gildardo Pounds, NP  spironolactone (ALDACTONE) 25 MG tablet Take 1 tablet (25 mg total) by mouth daily. 06/25/21 09/23/21 Yes Weaver, Scott T, PA-C  TRUEplus Lancets 28G MISC Use as instructed. Check blood glucose level by fingerstick 3 times per day. 01/08/21  Yes Gildardo Pounds, NP   MR FOOT RIGHT WO CONTRAST  Result Date: 08/31/2021 CLINICAL DATA:  Osteomyelitis, foot Presented with ulcer on the right second toe. Rule out osteomyelitis EXAM: MRI OF THE RIGHT FOREFOOT WITHOUT CONTRAST TECHNIQUE: Multiplanar, multisequence MR imaging of the right forefoot was performed. No intravenous contrast was administered. COMPARISON:  X-ray 08/30/2021 FINDINGS: Bones/Joint/Cartilage Bone marrow edema with confluent low T1 signal changes of the distal phalanx of the fourth toe compatible with acute osteomyelitis. Suspected erosion of the distal tuft. Mild bone marrow edema within the middle phalanx of the fourth toe. Trace fourth toe DIP and PIP effusions, which may be reactive or represent septic arthritis. Mild bone marrow edema within the distal tuft of the great toe distal phalanx without definite erosion. Remaining osseous structures appear within normal limits. No additional sites of osteomyelitis are identified. No acute fracture or dislocation. Joint spaces of the forefoot are preserved. Ligaments Intact Lisfranc ligament. Collateral ligaments of the forefoot appear intact. Muscles and Tendons Diffuse edema-like signal throughout the intrinsic foot musculature may reflect changes related to denervation and/or myositis. Intact flexor and extensor tendons. Soft tissues Soft tissue wound or ulceration at the distal aspect of the fourth toe. Soft tissue swelling  of the fourth toe with a 14 x 6 x 11 mm fluid collection adjacent to the fourth toe distal phalanx. IMPRESSION: 1. Soft tissue wound or ulceration at the distal aspect of the fourth toe with acute osteomyelitis of the distal phalanx. Mild bone marrow edema within the middle phalanx of the fourth toe, reactive osteitis versus early acute osteomyelitis. 2. 14 mm abscess within the soft tissues adjacent to the fourth toe distal phalanx. 3. Trace fourth toe DIP and PIP effusions, which may be reactive or represent septic arthritis. 4. Mild bone marrow edema within the distal tuft of the great toe distal phalanx without definite erosion. Findings may be reactive or represent early osteomyelitis. 5. Diffuse edema-like signal throughout the intrinsic foot musculature may reflect changes related to denervation and/or myositis. Electronically Signed   By: Davina Poke D.O.   On: 08/31/2021 09:40   ECHOCARDIOGRAM COMPLETE  Result Date: 08/31/2021    ECHOCARDIOGRAM REPORT   Patient Name:   MELBOURNE WHITEAKER Date of Exam: 08/31/2021 Medical Rec #:  [id number]   Height:       72.0 in Accession #:  [id number]  Weight:       178.1 lb Date of Birth:  01-Jul-1960    BSA:          2.028 m Patient Age:    25 years    BP:           121/88 mmHg Patient Gender: M           HR:           105 bpm. Exam Location:  Inpatient Procedure: 2D Echo, Color Doppler and Cardiac Doppler Indications:    Bacteremia  History:        Patient has prior history of Echocardiogram examinations, most                 recent 10/17/2018. CHF and Cardiomyopathy, Arrythmias:Atrial                 Fibrillation; Risk Factors:Hypertension and Diabetes.  Sonographer:    Joette Catching RCS Referring Phys: [place]  1. Left ventricular ejection fraction, by estimation, is 55 to 60%. The left ventricle has normal function. The left ventricle has no regional wall motion abnormalities. Left ventricular diastolic parameters are indeterminate.  2.  Right ventricular systolic function is low normal. The right ventricular size is normal.  3. Left atrial size was mild to moderately dilated.  4. The mitral valve is normal in structure. Mild mitral valve regurgitation.  5. The aortic valve is tricuspid. Aortic valve regurgitation is not visualized. Aortic valve sclerosis/calcification is present, without any evidence of aortic stenosis.  6. Aortic dilatation noted. There is mild dilatation of the aortic root, measuring 40 mm. There is mild dilatation of the ascending aorta, measuring 38 mm. FINDINGS  Left Ventricle: Left ventricular ejection fraction, by estimation, is 55 to 60%. The left ventricle has normal function. The left ventricle has no regional wall motion abnormalities. The left ventricular internal cavity size was normal in size. There is  no left ventricular hypertrophy. Left ventricular diastolic parameters are indeterminate. Right Ventricle: The right ventricular size is normal. Right vetricular wall thickness was not assessed. Right ventricular systolic function is low normal. Left Atrium: Left atrial size was mild to moderately dilated. Right Atrium: Right atrial size was normal in size. Pericardium: There is no evidence of pericardial effusion. Mitral Valve: The mitral valve is normal in structure. Mild mitral valve regurgitation. Tricuspid Valve: The tricuspid valve is normal in structure. Tricuspid valve regurgitation is trivial. Aortic Valve: The aortic valve is tricuspid. Aortic valve regurgitation is not visualized. Aortic valve sclerosis/calcification is present, without any evidence of aortic stenosis. Aortic valve mean gradient measures 4.0 mmHg. Aortic valve peak gradient measures 7.6 mmHg. Aortic valve area, by VTI measures 2.45 cm. Pulmonic Valve: The pulmonic valve was grossly normal. Pulmonic valve regurgitation is not visualized. Aorta: Aortic dilatation noted. There is mild dilatation of the aortic root, measuring 40 mm. There is  mild dilatation of the ascending aorta, measuring 38 mm. IAS/Shunts: No atrial level shunt detected by color flow Doppler.  LEFT VENTRICLE PLAX 2D LVIDd:         4.10 cm     Diastology LVIDs:         2.90 cm     LV e' medial:    8.92 cm/s LV PW:         1.00 cm     LV E/e' medial:  11.8 LV IVS:        0.90 cm  LV e' lateral:   13.70 cm/s LVOT diam:     2.00 cm     LV E/e' lateral: 7.7 LV SV:         59 LV SV Index:   29 LVOT Area:     3.14 cm  LV Volumes (MOD) LV vol d, MOD A2C: 54.8 ml LV vol d, MOD A4C: 81.0 ml LV vol s, MOD A2C: 30.5 ml LV vol s, MOD A4C: 36.1 ml LV SV MOD A2C:     24.3 ml LV SV MOD A4C:     81.0 ml LV SV MOD BP:      33.8 ml RIGHT VENTRICLE             IVC RV Basal diam:  3.80 cm     IVC diam: 1.90 cm RV Mid diam:    2.60 cm RV S prime:     16.90 cm/s TAPSE (M-mode): 1.7 cm LEFT ATRIUM             Index        RIGHT ATRIUM           Index LA diam:        4.50 cm 2.22 cm/m   RA Area:     16.00 cm LA Vol (A2C):   70.4 ml 34.71 ml/m  RA Volume:   40.80 ml  20.12 ml/m LA Vol (A4C):   87.0 ml 42.89 ml/m LA Biplane Vol: 79.8 ml 39.34 ml/m  AORTIC VALVE                     PULMONIC VALVE AV Area (Vmax):    2.15 cm      PV Vmax:       0.90 m/s AV Area (Vmean):   2.21 cm      PV Peak grad:  3.2 mmHg AV Area (VTI):     2.45 cm AV Vmax:           138.00 cm/s AV Vmean:          101.000 cm/s AV VTI:            0.242 m AV Peak Grad:      7.6 mmHg AV Mean Grad:      4.0 mmHg LVOT Vmax:         94.60 cm/s LVOT Vmean:        71.200 cm/s LVOT VTI:          0.189 m LVOT/AV VTI ratio: 0.78  AORTA Ao Root diam: 4.00 cm Ao Asc diam:  3.80 cm MITRAL VALVE                TRICUSPID VALVE MV Area (PHT): 5.97 cm     TR Peak grad:   23.4 mmHg MV Decel Time: 127 msec     TR Vmax:        242.00 cm/s MV E velocity: 105.00 cm/s                             SHUNTS                             Systemic VTI:  0.19 m                             Systemic Diam: 2.00 cm Dorris Carnes  MD Electronically signed by Dorris Carnes MD  Signature Date/Time: 08/31/2021/1:36:22 PM    Final    Inpatient: VAS [place] PAD ABI  Result Date: 08/31/2021  LOWER EXTREMITY DOPPLER STUDY Patient Name:  NATHANYEL BURCHILL  Date of Exam:   08/30/2021 Medical Rec #: [id number]    Accession #:    [id number] Date of Birth: [birth year]     Patient Gender: M Patient Age:   17 years Exam Location:  [facility] Procedure:      VAS [place] ABI WITH/WO TBI Referring Phys: Nyoka Lint DOUTOVA --------------------------------------------------------------------------------  Indications: Ulceration. High Risk Factors: Hypertension, Diabetes.  Comparison Study: No prior study Performing Technologist: Maudry Mayhew MHA, RVT, RDCS, RDMS  Examination Guidelines: A complete evaluation includes at minimum, Doppler waveform signals and systolic blood pressure reading at the level of bilateral brachial, anterior tibial, and posterior tibial arteries, when vessel segments are accessible. Bilateral testing is considered an integral part of a complete examination. Photoelectric Plethysmograph (PPG) waveforms and toe systolic pressure readings are included as required and additional duplex testing as needed. Limited examinations for reoccurring indications may be performed as noted.  ABI Findings: +---------+------------------+-----+----------+--------+ Right    Rt Pressure (mmHg)IndexWaveform  Comment  +---------+------------------+-----+----------+--------+ Brachial 168                    triphasic          +---------+------------------+-----+----------+--------+ PTA      137               0.82 monophasic         +---------+------------------+-----+----------+--------+ DP       136               0.81 monophasic         +---------+------------------+-----+----------+--------+ Great Toe                       Absent             +---------+------------------+-----+----------+--------+ +---------+------------------+-----+----------+-------+ Left     Lt Pressure  (mmHg)IndexWaveform  Comment +---------+------------------+-----+----------+-------+ Brachial 158                    triphasic         +---------+------------------+-----+----------+-------+ PTA      127               0.76 monophasic        +---------+------------------+-----+----------+-------+ DP       113               0.67 monophasic        +---------+------------------+-----+----------+-------+ Great Toe84                0.50                   +---------+------------------+-----+----------+-------+ +-------+-----------+-----------+------------+------------+ ABI/TBIToday's ABIToday's TBIPrevious ABIPrevious TBI +-------+-----------+-----------+------------+------------+ Right  0.82       0.00                                +-------+-----------+-----------+------------+------------+ Left   0.76       0.50                                +-------+-----------+-----------+------------+------------+  Summary: Right: Resting right ankle-brachial index indicates mild right lower extremity arterial disease. Absent great toe PPG waveform. Left: Resting  left ankle-brachial index indicates moderate left lower extremity arterial disease. The left toe-brachial index is abnormal. Guidlines:  Patients with an ABI >1.2 or <0.9 have a high risk of peripheral atherosclerosis. Patients with asymptomatic peripheral atherosclerosis can be managed without further referral, at the discretion of the ordering provider. Guidelines for medical therapy include the following: - Patients with ABI evidence of peripheral atherosclerosis and symptoms of ischemic rest pain, with or without lower extremities wounds should be referred to a vascular specialist for urgent evaluation. The following therapies are recommendations from the Society of Vascular Surgery for patients with peripheral arterial disease: _ Multidisciplinary comprehensive smoking cessation interventions. _ Blood glucose control with  goal A1c < 7%. _ Blood pressure control, < 140/90 mmHg. _ Statin therapy, goal LDL-C at least <100 mg/dL. _ Supervised exercise program (30-60 min of walking, three times a week) for patients with intermittent claudication. _ Aspirin 81mg  by mouth daily  *See table(s) above for measurements and observations.  Electronically signed by Deitra Mayo MD on 08/31/2021 at 10:52:34 AM.    Final    - pertinent xrays, CT, MRI studies were reviewed and independently interpreted  Positive ROS: All other systems have been reviewed and were otherwise negative with the exception of those mentioned in the HPI and as above.  Physical Exam: General: Alert, no acute distress Psychiatric: Patient is competent for consent with normal mood and affect Lymphatic: No axillary or cervical lymphadenopathy Cardiovascular: No pedal edema Respiratory: No cyanosis, no use of accessory musculature GI: No organomegaly, abdomen is soft and non-tender    Images:  @ENCIMAGES @  Labs:  Lab Results  Component Value Date   HGBA1C 11.2 (H) 08/30/2021   HGBA1C 11.3 (A) 01/08/2021   HGBA1C 9.7 (H) 08/26/2019   REPTSTATUS PENDING 08/30/2021   REPTSTATUS PENDING 08/30/2021   CULT  08/30/2021    NO GROWTH < 12 HOURS Performed at [facility], [place] [address], [place], [place] [address]    CULT  08/30/2021    NO GROWTH < 12 HOURS Performed at [facility] [address] [place], [place] [address]     Lab Results  Component Value Date   ALBUMIN 3.3 (L) 08/30/2021   ALBUMIN 3.3 (L) 08/29/2021   ALBUMIN 4.1 01/08/2021   PREALBUMIN 16.6 (L) 08/30/2021        Latest Ref Rng & Units 08/31/2021    2:24 AM 08/30/2021    2:32 AM 08/30/2021   12:04 AM  CBC EXTENDED  WBC 4.0 - 10.5 K/uL 7.2   12.3   13.9    RBC 4.22 - 5.81 MIL/uL 4.48   5.10   4.73    Hemoglobin 13.0 - 17.0 g/dL 14.4   16.4   15.1    HCT 39.0 - 52.0 % 41.7   47.1   43.8    Platelets 150 - 400 K/uL 166   179   203    NEUT# 1.7 - 7.7  K/uL   12.3    Lymph# 0.7 - 4.0 K/uL   0.7      Neurologic: Patient does not have protective sensation bilateral lower extremities.   MUSCULOSKELETAL:   Skin: Examination patient has swelling of the right foot fourth toe with a necrotic gangrenous ulcer.  Radiographs show destructive bony changes.  MRI scan shows chronic osteomyelitis of the fourth toe.  Patient does not have a palpable pulse at the ankle.  Review of the ankle-brachial indices shows monophasic flow on the right with  an ABI of 0.82 which is elevated secondary to the calcification.  ABI on the left 2.76.  Patient's albumin is 3.3.  Hemoglobin A1c 11.2.  Assessment: Assessment uncontrolled diabetes with atrial fibrillation on anticoagulation therapy with osteomyelitis and gangrenous ulcer right foot fourth toe with peripheral vascular disease.  Plan: Anticipate vascular surgery to proceed with endovascular surgery tomorrow Thursday.  I will be available for intervention of the foot pending results of endovascular reconstruction.  Thank you for the consult and the opportunity to see Mr. Landis Martins, MD [facility] [phone] 7:24 AM

## 2021-09-01 NOTE — Progress Notes (Signed)
Pharmacy Antibiotic Note  David [name] is a 61 y.o. male admitted on 08/29/2021 with bacteremia and cellulitis.  Pharmacy has been consulted for vancomycin dosing.  AKI on admission, now improving. Will adjust vancomycin to maintain goal AUC.   Plan: Vancomycin 1000mg  Q12h (eAUC 491, Scr 1.14, goal AUC 400-550) Metronidazole 500mg  Q12h (per MD)  Trend WBC, Fever, Renal function F/u cultures, clinical course  De-escalate when able   Height: 6' (182.9 cm) Weight: 83.1 kg (183 lb 3.2 oz) IBW/kg (Calculated) : 77.6  Temp (24hrs), Avg:98.4 F (36.9 C), Min:97.6 F (36.4 C), Max:99.3 F (37.4 C)  Recent Labs  Lab 08/29/21 1732 08/29/21 2005 08/29/21 2230 08/30/21 0004 08/30/21 0232 08/31/21 0224 09/01/21 0454  WBC 18.7*  --   --  13.9* 12.3* 7.2  --   CREATININE 1.27*  --   --  1.30* 1.30* 1.35* 1.14  LATICACIDVEN  --  1.4 1.2  --   --   --   --     Estimated Creatinine Clearance: 76.6 mL/min (by C-G formula based on SCr of 1.14 mg/dL).    No Known Allergies  Antimicrobials this admission: Cefepime 5/28 >> 5/29 Metronidazole 5/29 >>  Vancomycin 5/29 >>   Microbiology results: 5/28 BCx: MRSA 5/29 Bcx: pending   Thank you for allowing pharmacy to be a part of this patient's care.  6/28, PharmD Clinical Pharmacist

## 2021-09-01 NOTE — Progress Notes (Addendum)
   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  His Eliquis is on hold.  He is scheduled for an arteriogram tomorrow with Dr. Clark.  I have written his preop orders.  ANTICOAGULATION: Not sure what time his arteriogram will be but will need to stop his heparin 2 hours prior to the procedure.  SUBJECTIVE:   He was sleeping comfortably.  I did not wake him up.  PHYSICAL EXAM:   Vitals:   08/31/21 1959 08/31/21 2351 09/01/21 0414 09/01/21 0500  BP: (!) 141/86 138/89 (!) 125/98   Pulse: 99 98 94   Resp: 18 18 18 18  Temp: 97.9 F (36.6 C) 99.3 F (37.4 C) 98.7 F (37.1 C)   TempSrc: Oral Oral Oral   SpO2: 96% 96% 98%   Weight:    83.1 kg  Height:       His foot is unchanged.  LABS:   Lab Results  Component Value Date   WBC 7.2 08/31/2021   HGB 14.4 08/31/2021   HCT 41.7 08/31/2021   MCV 93.1 08/31/2021   PLT 166 08/31/2021   Lab Results  Component Value Date   CREATININE 1.14 09/01/2021   Lab Results  Component Value Date   INR 1.2 08/30/2021   CBG (last 3)  Recent Labs    08/31/21 1644 08/31/21 2047 09/01/21 0607  GLUCAP 229* 266* 153*    PROBLEM LIST:    Principal Problem:   Sepsis (HCC) Active Problems:   Essential hypertension   Insulin dependent type 2 diabetes mellitus (HCC)   Chronic systolic CHF (congestive heart failure) (HCC)   Persistent atrial fibrillation (HCC)   SIRS (systemic inflammatory response syndrome) (HCC)   Hyponatremia   Hypomagnesemia   Cellulitis in diabetic foot (HCC)   CURRENT MEDS:    carvedilol  3.125 mg Oral BID WC   digoxin  125 mcg Oral Daily   feeding supplement (GLUCERNA SHAKE)  237 mL Oral BID BM   insulin aspart  0-15 Units Subcutaneous TID WC   insulin aspart  0-5 Units Subcutaneous QHS   insulin aspart  5 Units Subcutaneous TID WC   insulin glargine-yfgn  30 Units Subcutaneous QHS   loratadine  10 mg Oral  Daily   metroNIDAZOLE  500 mg Oral Q12H    Susana Gripp Office: [phone] 09/01/2021  

## 2021-09-01 NOTE — H&P (View-Only) (Signed)
   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  His Eliquis is on hold.  He is scheduled for an arteriogram tomorrow with Dr. Chestine Spore.  I have written his preop orders.  ANTICOAGULATION: Not sure what time his arteriogram will be but will need to stop his heparin 2 hours prior to the procedure.  SUBJECTIVE:   He was sleeping comfortably.  I did not wake him up.  PHYSICAL EXAM:   Vitals:   08/31/21 1959 08/31/21 2351 09/01/21 0414 09/01/21 0500  BP: (!) 141/86 138/89 (!) 125/98   Pulse: 99 98 94   Resp: 18 18 18 18   Temp: 97.9 F (36.6 C) 99.3 F (37.4 C) 98.7 F (37.1 C)   TempSrc: Oral Oral Oral   SpO2: 96% 96% 98%   Weight:    83.1 kg  Height:       His foot is unchanged.  LABS:   Lab Results  Component Value Date   WBC 7.2 08/31/2021   HGB 14.4 08/31/2021   HCT 41.7 08/31/2021   MCV 93.1 08/31/2021   PLT 166 08/31/2021   Lab Results  Component Value Date   CREATININE 1.14 09/01/2021   Lab Results  Component Value Date   INR 1.2 08/30/2021   CBG (last 3)  Recent Labs    08/31/21 1644 08/31/21 2047 09/01/21 0607  GLUCAP 229* 266* 153*    PROBLEM LIST:    Principal Problem:   Sepsis (HCC) Active Problems:   Essential hypertension   Insulin dependent type 2 diabetes mellitus (HCC)   Chronic systolic CHF (congestive heart failure) (HCC)   Persistent atrial fibrillation (HCC)   SIRS (systemic inflammatory response syndrome) (HCC)   Hyponatremia   Hypomagnesemia   Cellulitis in diabetic foot (HCC)   CURRENT MEDS:    carvedilol  3.125 mg Oral BID WC   digoxin  125 mcg Oral Daily   feeding supplement (GLUCERNA SHAKE)  237 mL Oral BID BM   insulin aspart  0-15 Units Subcutaneous TID WC   insulin aspart  0-5 Units Subcutaneous QHS   insulin aspart  5 Units Subcutaneous TID WC   insulin glargine-yfgn  30 Units Subcutaneous QHS   loratadine  10 mg Oral  Daily   metroNIDAZOLE  500 mg Oral Q12H    09/03/21 Office: [phone] 09/01/2021

## 2021-09-01 NOTE — Progress Notes (Signed)
[place] for Heparin (holding PTA Eliquis for procedure) Indication: atrial fibrillation  No Known Allergies  Patient Measurements: Height: 6' (182.9 cm) Weight: 83.1 kg (183 lb 3.2 oz) IBW/kg (Calculated) : 77.6  Heparin Dosing Weight: 82 kg  Vital Signs: Temp: 98.2 F (36.8 C) (05/31 1117) Temp Source: Oral (05/31 1117) BP: 113/97 (05/31 1117) Pulse Rate: 80 (05/31 1117)  Labs: Recent Labs    08/30/21 0004 08/30/21 0232 08/31/21 0224 09/01/21 0454 09/01/21 1358  HGB 15.1 16.4 14.4  --   --   HCT 43.8 47.1 41.7  --   --   PLT 203 179 166  --   --   APTT 33  --   --  64* 78*  LABPROT 14.8  --   --   --   --   INR 1.2  --   --   --   --   HEPARINUNFRC  --   --   --  1.04* 0.94*  CREATININE 1.30* 1.30* 1.35* 1.14  --   CKTOTAL 22*  --   --   --   --     Estimated Creatinine Clearance: 76.6 mL/min (by C-G formula based on SCr of 1.14 mg/dL).   Assessment: 61 year old male with history of chronic A-fib on Eliquis PTA. Eliquis currently held in preparation for arteriogram planned on Thursday 6/01. Last dose of Eliquis 5/30 @ 1000. Pharmacy consulted to manage heparin while apixaban on hold.  Heparin level remains falsely elevated. aPTT therapeutic 78 on heparin 1400 units/hr. CBC stable. No issues with bleeding reported.   Goal of Therapy:  Heparin level 0.3-0.7 units/ml aPTT 66-102 seconds Monitor platelets by anticoagulation protocol: Yes   Plan:  Continue heparin infusion at 1400 units/hr Check heparin level daily while on heparin Continue to monitor H&H and platelets    Thank you for allowing pharmacy to be a part of this patient's care.  Ardyth Harps, PharmD Clinical Pharmacist

## 2021-09-02 ENCOUNTER — Ambulatory Visit (HOSPITAL_COMMUNITY): Admit: 2021-09-02 | Payer: Self-pay | Admitting: Vascular Surgery

## 2021-09-02 ENCOUNTER — Encounter (HOSPITAL_COMMUNITY)
Admission: EM | Disposition: A | Discharge status: Home Health | Payer: Self-pay | Source: Home / Self Care | Attending: Internal Medicine

## 2021-09-02 ENCOUNTER — Encounter (HOSPITAL_COMMUNITY): Payer: Self-pay | Admitting: Surgery

## 2021-09-02 DIAGNOSIS — I70235 Atherosclerosis of native arteries of right leg with ulceration of other part of foot: Secondary | ICD-10-CM

## 2021-09-02 HISTORY — PX: ABDOMINAL AORTOGRAM W/LOWER EXTREMITY: CATH118223

## 2021-09-02 HISTORY — PX: PERIPHERAL VASCULAR BALLOON ANGIOPLASTY: CATH118281

## 2021-09-02 LAB — GLUCOSE, CAPILLARY
Glucose-Capillary: 197 mg/dL — ABNORMAL HIGH (ref 70–99)
Glucose-Capillary: 145 mg/dL — ABNORMAL HIGH (ref 70–99)
Glucose-Capillary: 272 mg/dL — ABNORMAL HIGH (ref 70–99)
Glucose-Capillary: 222 mg/dL — ABNORMAL HIGH (ref 70–99)

## 2021-09-02 LAB — CBC
HCT: 41.9 % (ref 39.0–52.0)
Hemoglobin: 14.4 g/dL (ref 13.0–17.0)
MCH: 31.6 pg (ref 26.0–34.0)
MCHC: 34.4 g/dL (ref 30.0–36.0)
MCV: 92.1 fL (ref 80.0–100.0)
Platelets: 210 10*3/uL (ref 150–400)
RBC: 4.55 MIL/uL (ref 4.22–5.81)
RDW: 12.2 % (ref 11.5–15.5)
WBC: 8.3 10*3/uL (ref 4.0–10.5)
nRBC: 0 % (ref 0.0–0.2)

## 2021-09-02 LAB — BASIC METABOLIC PANEL
Anion gap: 7 (ref 5–15)
BUN: 21 mg/dL — ABNORMAL HIGH (ref 6–20)
CO2: 23 mmol/L (ref 22–32)
Calcium: 9.2 mg/dL (ref 8.9–10.3)
Chloride: 103 mmol/L (ref 98–111)
Creatinine, Ser: 1.11 mg/dL (ref 0.61–1.24)
GFR, Estimated: >60 mL/min (ref >60)
Glucose, Bld: 204 mg/dL — ABNORMAL HIGH (ref 70–99)
Potassium: 4.1 mmol/L (ref 3.5–5.1)
Sodium: 133 mmol/L — ABNORMAL LOW (ref 135–145)

## 2021-09-02 LAB — HEPARIN LEVEL (UNFRACTIONATED): Heparin Unfractionated: 0.59 IU/mL (ref 0.30–0.70)

## 2021-09-02 LAB — APTT: aPTT: 59 seconds — ABNORMAL HIGH (ref 24–36)

## 2021-09-02 LAB — POCT ACTIVATED CLOTTING TIME: Activated Clotting Time: 263 seconds

## 2021-09-02 SURGERY — ABDOMINAL AORTOGRAM W/LOWER EXTREMITY
Anesthesia: LOCAL

## 2021-09-02 MED ORDER — SODIUM CHLORIDE 0.9 % WEIGHT BASED INFUSION: 1.0000 mL/kg/h | INTRAVENOUS | Status: AC

## 2021-09-02 MED ORDER — HEPARIN (PORCINE) IN NACL 1000-0.9 UT/500ML-% IV SOLN
INTRAVENOUS | Status: DC | PRN
  Administered 2021-09-02 (×2): 500 mL

## 2021-09-02 MED ORDER — SODIUM CHLORIDE 0.9 % IV SOLN: INTRAVENOUS | Status: DC

## 2021-09-02 MED ORDER — HEPARIN (PORCINE) 25000 UT/250ML-% IV SOLN
1400.0000 [IU]/h | INTRAVENOUS | Status: DC
Start: 2021-09-02 — End: 2021-09-02

## 2021-09-02 MED ORDER — LIDOCAINE HCL (PF) 1 % IJ SOLN
INTRAMUSCULAR | Status: DC | PRN
  Administered 2021-09-02: 15 mL

## 2021-09-02 MED ORDER — CLOPIDOGREL BISULFATE 75 MG PO TABS
75.0000 mg | ORAL_TABLET | Freq: Every day | ORAL | Status: DC
Start: 2021-09-03 — End: 2021-09-07
  Administered 2021-09-03 – 2021-09-06 (×4): 75 mg via ORAL
  Filled 2021-09-02 (×5): qty 1

## 2021-09-02 MED ORDER — SODIUM CHLORIDE 0.9 % IV SOLN: 250.0000 mL | INTRAVENOUS | Status: DC | PRN

## 2021-09-02 MED ORDER — HEPARIN (PORCINE) 25000 UT/250ML-% IV SOLN
1550.0000 [IU]/h | INTRAVENOUS | Status: DC
  Administered 2021-09-02 – 2021-09-03 (×2): 1550 [IU]/h via INTRAVENOUS
  Filled 2021-09-02 (×2): qty 250

## 2021-09-02 MED ORDER — SODIUM CHLORIDE 0.9 % IV SOLN
INTRAVENOUS | Status: AC | PRN
  Administered 2021-09-02: 10 mL/h via INTRAVENOUS

## 2021-09-02 MED ORDER — ACETAMINOPHEN 325 MG PO TABS: 650.0000 mg | ORAL_TABLET | ORAL | Status: DC | PRN

## 2021-09-02 MED ORDER — HYDRALAZINE HCL 20 MG/ML IJ SOLN: 5.0000 mg | INTRAMUSCULAR | Status: DC | PRN

## 2021-09-02 MED ORDER — FENTANYL CITRATE (PF) 100 MCG/2ML IJ SOLN
INTRAMUSCULAR | Status: DC | PRN
  Administered 2021-09-02 (×2): 25 ug via INTRAVENOUS
  Administered 2021-09-02: 50 ug via INTRAVENOUS

## 2021-09-02 MED ORDER — HEPARIN SODIUM (PORCINE) 1000 UNIT/ML IJ SOLN
INTRAMUSCULAR | Status: DC | PRN
  Administered 2021-09-02: 1000 [IU] via INTRAVENOUS
  Administered 2021-09-02: 8000 [IU] via INTRAVENOUS

## 2021-09-02 MED ORDER — LABETALOL HCL 5 MG/ML IV SOLN: 10.0000 mg | INTRAVENOUS | Status: DC | PRN

## 2021-09-02 MED ORDER — ASPIRIN 81 MG PO TBEC
81.0000 mg | DELAYED_RELEASE_TABLET | Freq: Every day | ORAL | Status: DC
  Administered 2021-09-02 – 2021-09-06 (×5): 81 mg via ORAL
  Filled 2021-09-02 (×5): qty 1

## 2021-09-02 MED ORDER — ONDANSETRON HCL 4 MG/2ML IJ SOLN: 4.0000 mg | Freq: Four times a day (QID) | INTRAMUSCULAR | Status: DC | PRN

## 2021-09-02 MED ORDER — SODIUM CHLORIDE 0.9% FLUSH: 3.0000 mL | INTRAVENOUS | Status: DC | PRN

## 2021-09-02 MED ORDER — MIDAZOLAM HCL 2 MG/2ML IJ SOLN
INTRAMUSCULAR | Status: DC | PRN
  Administered 2021-09-02 (×2): 1 mg via INTRAVENOUS
  Administered 2021-09-02: 2 mg via INTRAVENOUS

## 2021-09-02 MED ORDER — HEPARIN SODIUM (PORCINE) 1000 UNIT/ML IJ SOLN
INTRAMUSCULAR | Status: AC
  Filled 2021-09-02: qty 10

## 2021-09-02 MED ORDER — MIDAZOLAM HCL 2 MG/2ML IJ SOLN
INTRAMUSCULAR | Status: AC
  Filled 2021-09-02: qty 2

## 2021-09-02 MED ORDER — INSULIN ASPART 100 UNIT/ML IJ SOLN
10.0000 [IU] | Freq: Three times a day (TID) | INTRAMUSCULAR | Status: DC
  Administered 2021-09-02 (×2): 10 [IU] via SUBCUTANEOUS

## 2021-09-02 MED ORDER — HEPARIN (PORCINE) IN NACL 1000-0.9 UT/500ML-% IV SOLN
INTRAVENOUS | Status: AC
  Filled 2021-09-02: qty 500

## 2021-09-02 MED ORDER — FENTANYL CITRATE (PF) 100 MCG/2ML IJ SOLN
INTRAMUSCULAR | Status: AC
  Filled 2021-09-02: qty 2

## 2021-09-02 MED ORDER — SODIUM CHLORIDE 0.9% FLUSH
3.0000 mL | Freq: Two times a day (BID) | INTRAVENOUS | Status: DC
Start: 2021-09-02 — End: 2021-09-07
  Administered 2021-09-04 – 2021-09-05 (×3): 3 mL via INTRAVENOUS

## 2021-09-02 MED ORDER — IODIXANOL 320 MG/ML IV SOLN
INTRAVENOUS | Status: DC | PRN
  Administered 2021-09-02: 240 mL

## 2021-09-02 MED ORDER — LIDOCAINE HCL (PF) 1 % IJ SOLN
INTRAMUSCULAR | Status: AC
  Filled 2021-09-02: qty 30

## 2021-09-02 SURGICAL SUPPLY — 20 items
BAG SNAP BAND KOVER 36X36 (MISCELLANEOUS) ×1 IMPLANT
BALLN STERLING OTW 2X100X150 (BALLOONS) ×3
BALLN STERLING OTW 3X220X150 (BALLOONS) ×3
BALLOON STERLING OTW 2X100X150 (BALLOONS) IMPLANT
BALLOON STERLING OTW 3X220X150 (BALLOONS) IMPLANT
CATH INFINITI VERT 5FR 125CM (CATHETERS) ×1 IMPLANT
CATH OMNI FLUSH 5F 65CM (CATHETERS) ×1 IMPLANT
DEVICE VASC CLSR CELT ART 6 (Vascular Products) ×1 IMPLANT
KIT MICROPUNCTURE NIT STIFF (SHEATH) ×1 IMPLANT
KIT PV (KITS) ×1 IMPLANT
SHEATH PINNACLE 5F 10CM (SHEATH) ×1 IMPLANT
SHEATH PINNACLE 6F 10CM (SHEATH) ×1 IMPLANT
SHEATH PINNACLE ST 6F 65CM (SHEATH) ×1 IMPLANT
SHEATH PROBE COVER 6X72 (BAG) ×1 IMPLANT
SYR MEDRAD MARK 7 150ML (SYRINGE) ×4 IMPLANT
TRANSDUCER W/STOPCOCK (MISCELLANEOUS) ×4 IMPLANT
TRAY PV CATH (CUSTOM PROCEDURE TRAY) ×1 IMPLANT
TUBING CONTRAST HIGH PRESS 20 (MISCELLANEOUS) ×2 IMPLANT
WIRE BENTSON .035X145CM (WIRE) ×1 IMPLANT
WIRE G V18X300CM (WIRE) ×3 IMPLANT

## 2021-09-02 NOTE — Progress Notes (Signed)
    [facility] has been requested to perform a transesophageal echocardiogram on Eliga Cawley for bacteremia. Per chart review, patient has a past medical history of type 2 DM, chronic a-fib on Eliquis, chronic systolic CHF, HTN, HLD. Patient presented to the ED complaining of fatigue, palpitations, lightheadedness, diaphoresis. Patient  was found to have an ulcer on the right second toe. He was started on broad-spectrum antibiotics. Blood cultures from 5/28 showed MRSA. MRI of right food showed acute osteomyelitis of the distal phalanx, 14 mm abscess within the soft tissues. Orthopedics and vascular surgery are consulting. ID requested TEE after echocardiogram did not show vegetations.   Patient had an abdominal aortogram today and received conscious sedation. However, sedation was stopped at 1028, patient has not had anesthesia for the past 6 hours at the time of our conversation.   After careful review of history and examination, the risks and benefits of transesophageal echocardiogram have been explained including risks of esophageal damage, perforation (1:10,000 risk), bleeding, pharyngeal hematoma as well as other potential complications associated with conscious sedation including aspiration, arrhythmia, respiratory failure and death. Alternatives to treatment were discussed, questions were answered. Patient is willing to proceed.   Koen Antilla R. Alyzabeth Pontillo, PA-C 09/02/2021 5:14 PM   

## 2021-09-02 NOTE — TOC Initial Note (Signed)
Transition of Care [facility]) - Initial/Assessment Note    Patient Details  Name: David [name] MRN: [id number] Date of Birth: 1960/04/21  Transition of [name] CM/SW Contact:    Lockie Pares, RN Phone Number: 09/02/2021, 4:24 PM  Clinical Narrative:                  The Transition of Care Department [facility]) has reviewed patient and no TOC needs have been identified at this time. We will continue to monitor patient advancement through interdisciplinary progression rounds. If new patient transition needs arise, please place a TOC consultransition of Care      Patient Goals and CMS Choice        Expected Discharge Plan and Services                                                Prior Living Arrangements/Services                       Activities of Daily Living Home Assistive Devices/Equipment: CBG Meter ADL Screening (condition at time of admission) Patient's cognitive ability adequate to safely complete daily activities?: Yes Is the patient deaf or have difficulty hearing?: No Does the patient have difficulty seeing, even when wearing glasses/contacts?: No Does the patient have difficulty concentrating, remembering, or making decisions?: No Patient able to express need for assistance with ADLs?: Yes Does the patient have difficulty dressing or bathing?: No Independently performs ADLs?: Yes (appropriate for developmental age) Does the patient have difficulty walking or climbing stairs?: No Weakness of Legs: None Weakness of Arms/Hands: None  Permission Sought/Granted                  Emotional Assessment              Admission diagnosis:  Hypomagnesemia [E83.42] Fever of unknown origin [R50.9] Hyponatremia [E87.1] Atrial fibrillation with RVR (HCC) [I48.91] Sepsis (HCC) [A41.9] 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)    Gangrene of toe of right foot (HCC)    Sepsis (HCC)  08/29/2021   SIRS (systemic inflammatory response syndrome) (HCC) 08/29/2021   Hyponatremia 08/29/2021   Hypomagnesemia 08/29/2021   Cellulitis in diabetic foot (HCC) 08/29/2021   Pure hypercholesterolemia 06/17/2021   Cellulitis 08/26/2019   Persistent atrial fibrillation (HCC) 08/26/2019   Chronic systolic CHF (congestive heart failure) (HCC) 10/18/2018   Transaminitis 10/17/2018   Hyperbilirubinemia 10/17/2018   Leukocytosis 10/17/2018   Sinusitis 10/17/2018   Chronic anticoagulation 09/30/2017   Smoker 09/30/2017   Cardiomyopathy (HCC) 09/30/2017   Sleep apnea suspected 09/30/2017   Essential hypertension 09/26/2017   Insulin dependent type 2 diabetes mellitus (HCC) 09/26/2017   Atrial fibrillation with RVR (HCC) 09/26/2017   PCP:  Claiborne Rigg, NP Pharmacy:   [facility] [address] [place] [address] Phone: [phone] Fax: [phone]  [facility] - [address] [place] [address] Phone: [phone] Fax: [phone]  [facility] [place], [place] - [address] [place] [address] Phone: [phone] Fax: [phone]     Social Determinants of Health (SDOH) Interventions    Readmission Risk  Interventions    08/29/2019    9:50 AM  Readmission Risk Prevention Plan  Medication Screening Complete  Transportation Screening Complete
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
## 2021-09-02 NOTE — Progress Notes (Signed)
[facility] for Heparin (holding PTA Eliquis for procedure) Indication: atrial fibrillation  No Known Allergies  Patient Measurements: Height: 6' (182.9 cm) Weight: 80 kg (176 lb 5.9 oz) IBW/kg (Calculated) : 77.6  Heparin Dosing Weight: 80 kg  Vital Signs: Temp: 97.7 F (36.5 C) (06/01 1142) Temp Source: Oral (06/01 1142) BP: 136/90 (06/01 1142) Pulse Rate: 72 (06/01 1142)  Labs: Recent Labs    08/31/21 0224 09/01/21 0454 09/01/21 1358 09/02/21 0329  HGB 14.4  --   --  14.4  HCT 41.7  --   --  41.9  PLT 166  --   --  210  APTT  --  64* 78* 59*  HEPARINUNFRC  --  1.04* 0.94* 0.59  CREATININE 1.35* 1.14  --  1.11     Estimated Creatinine Clearance: 78.6 mL/min (by C-G formula based on SCr of 1.11 mg/dL).   Assessment: 61 year old male with history of chronic A-fib on Eliquis PTA. Eliquis currently held in preparation for arteriogram planned on Thursday 6/01. Last dose of Eliquis 5/30 @ 1000. Pharmacy consulted to manage heparin while apixaban on hold.  Today the heparin level remains falsely elevated. aPTT  is subtherapeutic 59 this AM on heparin 1400 units/hr. CBC stable.  Heparin drip stopped ~ 0700-0800 today prior to procedure.  Now S/p abdominal aortogram,  and right posterior tibial artery angioplasty. Vascular surgery PA has ordered to restart hepairn drip at  [facility].  No issues with bleeding reported.  Will increase heparin infusion rate since aPTT was below target of 66-102 seconds this morning.   Goal of Therapy:  Heparin level 0.3-0.7 units/ml aPTT 66-102 seconds Monitor platelets by anticoagulation protocol: Yes   Plan:  At [facility] restart heparin infusion at 1550 units/hr Check aPTT, HL 8 hours after heparin restarted. Check aPTT,  heparin level  daily while on heparin Continue to monitor H&H and platelets    Thank you for allowing pharmacy to be a part of this patient's care. Nicole Cella, RPh Clinical  Pharmacist 09/02/2021 3:36 PM  Please check AMION for all [facility] phone numbers After 10:00 PM, call [facility] [phone]

## 2021-09-02 NOTE — Interval H&P Note (Signed)
History and Physical Interval Note:  09/02/2021 8:50 AM  David [name]  has presented today for surgery, with the diagnosis of PAD.  The various methods of treatment have been discussed with the patient and family. After consideration of risks, benefits and other options for treatment, the patient has consented to  Procedure(s): ABDOMINAL AORTOGRAM W/LOWER EXTREMITY (N/A) as a surgical intervention.  The patient's history has been reviewed, patient examined, no change in status, stable for surgery.  I have reviewed the patient's chart and labs.  Questions were answered to the patient's satisfaction.     Durene Cal

## 2021-09-02 NOTE — Op Note (Signed)
Patient name: David [name] MRN: [id number] DOB: Aug 09, 1960 Sex: male  08/29/2021 - 09/02/2021 Pre-operative Diagnosis: Right toe ulcer Post-operative diagnosis:  Same Surgeon:  Durene Cal Procedure Performed:  1.  Ultrasound-guided access, left femoral artery  2.  Abdominal aortogram  3.  Bilateral lower extremity runoff  4.  Angioplasty, right posterior tibial artery  5.  Conscious sedation, 98 minutes  6.  Closure device, Celt     Indications: This is a 61 year old gentleman with limb threatening ischemia to the right leg with a absent right toe pressure.  ABIs were 0.82 on the right and 0.76 on the left.  Wi-Fi score is 2/0/1.  He comes in today for angiographic evaluation  Procedure:  The patient was identified in the holding area and taken to room 8.  The patient was then placed supine on the table and prepped and draped in the usual sterile fashion.  A time out was called.  Conscious sedation was administered with the use of IV fentanyl and Versed under continuous physician and nurse monitoring.  Heart rate, blood pressure, and oxygen saturation were continuously monitored.  Total sedation time was 98 minutes.  Ultrasound was used to evaluate the left common femoral artery.  It was patent .  A digital ultrasound image was acquired.  A micropuncture needle was used to access the left common femoral artery under ultrasound guidance.  An 018 wire was advanced without resistance and a micropuncture sheath was placed.  The 018 wire was removed and a benson wire was placed.  The micropuncture sheath was exchanged for a 5 french sheath.  An omniflush catheter was advanced over the wire to the level of L-1.  An abdominal angiogram was obtained.  Next, using the omniflush catheter and a benson wire, the aortic bifurcation was crossed and the catheter was placed into theright external iliac artery and right runoff was obtained.    Findings:   Aortogram: No significant renal artery stenosis was  identified.  The infrarenal abdominal aorta is widely patent.  Bilateral common and external iliac arteries widely patent.  Right Lower Extremity: The right common femoral profundofemoral and superficial femoral arteries are widely patent.  The popliteal artery is widely patent.  There is single-vessel runoff via the peroneal artery which reconstitutes a posterior tibial at the ankle.  Left Lower Extremity: Not evaluated given contrast administered for intervention.  Intervention:    After the above images were acquired the decision was made to proceed with intervention.  A 6 French 65 cm sheath was advanced into the right superficial femoral artery.  The patient was fully heparinized.  Next using a V18 wire and a vertebral catheter, I was able to cannulate the nub of the posterior tibial artery.  I was then able to [facility] the V-18 wire across the ankle with the support of a Sterling 2 x 100 balloon.  I then performed balloon angioplasty of the posterior tibial artery with this balloon.  Follow-up imaging revealed inline flow however the artery was not fully expanded and so I inserted a 3 mm balloon and repeated balloon angioplasty of the posterior tibial artery from the ankle up.  After this was performed, completion imaging showed two-vessel runoff.  There was still disease limited to the foot.  I then tried to advance a wire across the ankle onto the diseased pedal vessels.  I was able to perform balloon angioplasty but did not get as good of a result as I had hoped, however he still  has better blood flow lateral to the foot.  At this point I elected to stop.  The groin was closed with a Celt.                                    Impression:  #1  No significant aortoiliac occlusive disease  #2  No significant outflow disease on the right.  #3  Single-vessel runoff via the peroneal artery on the right  #4  Successful recanalization of an occluded posterior tibial artery and subsequent balloon angioplasty  with a 3 mm balloon.     Juleen China, M.D., [facility] [place] Office: [phone] Pager:  [phone]

## 2021-09-02 NOTE — H&P (View-Only) (Signed)
    [facility] has been requested to perform a transesophageal echocardiogram on David [name] for bacteremia. Per chart review, patient has a past medical history of type 2 DM, chronic a-fib on Eliquis, chronic systolic CHF, HTN, HLD. Patient presented to the ED complaining of fatigue, palpitations, lightheadedness, diaphoresis. Patient  was found to have an ulcer on the right second toe. He was started on broad-spectrum antibiotics. Blood cultures from 5/28 showed MRSA. MRI of right food showed acute osteomyelitis of the distal phalanx, 14 mm abscess within the soft tissues. Orthopedics and vascular surgery are consulting. ID requested TEE after echocardiogram did not show vegetations.   Patient had an abdominal aortogram today and received conscious sedation. However, sedation was stopped at 1028, patient has not had anesthesia for the past 6 hours at the time of our conversation.   After careful review of history and examination, the risks and benefits of transesophageal echocardiogram have been explained including risks of esophageal damage, perforation (1:10,000 risk), bleeding, pharyngeal hematoma as well as other potential complications associated with conscious sedation including aspiration, arrhythmia, respiratory failure and death. Alternatives to treatment were discussed, questions were answered. Patient is willing to proceed.   Margie Billet, PA-C 09/02/2021 5:14 PM

## 2021-09-02 NOTE — Progress Notes (Signed)
Inpatient Diabetes Program Recommendations  AACE/ADA: New Consensus Statement on Inpatient Glycemic Control   Target Ranges:  Prepandial:   less than 140 mg/dL      Peak postprandial:   less than 180 mg/dL (1-2 hours)      Critically ill patients:  140 - 180 mg/dL    Latest Reference Range & Units 09/01/21 06:07 09/01/21 11:40 09/01/21 16:01 09/01/21 21:15 09/02/21 06:07  Glucose-Capillary 70 - 99 mg/dL 153 (H) 235 (H) 317 (H) 210 (H) 197 (H)   Review of Glycemic Control  Diabetes history: DM2 Outpatient Diabetes medications: Basaglar 16 units BID, Novolog 10-12 units TID Current orders for Inpatient glycemic control: Semglee 30 units QHS, Novolog 0-15 units TID with meals, Novolog 0-5 units QHS, Novolog 5 units TID with meals   Inpatient Diabetes Program Recommendations:     Insulin: Please consider increasing meal coverage to Novolog 10 units TID with meals.  Thanks, Barnie Alderman, RN, MSN, [facility] Diabetes Coordinator Inpatient Diabetes Program [phone] (Team Pager from 8am to [place])

## 2021-09-02 NOTE — Progress Notes (Signed)
VASCULAR SURGERY:  The patient is scheduled for arteriography today.  His Eliquis has been on hold.  We will need to stop his heparin 2 hours before the procedure.  This will likely be in the afternoon.  I have discussed our plans today with the patient and answered all of his questions.  He is agreeable to proceed.  Dr. Sharol Given is following the right foot wounds.  Gae Gallop, MD 7:07 AM

## 2021-09-03 ENCOUNTER — Inpatient Hospital Stay (HOSPITAL_COMMUNITY): Payer: Self-pay

## 2021-09-03 ENCOUNTER — Encounter (HOSPITAL_COMMUNITY): Payer: Self-pay | Admitting: Internal Medicine

## 2021-09-03 ENCOUNTER — Encounter (HOSPITAL_COMMUNITY)
Admission: EM | Disposition: A | Discharge status: Home Health | Payer: Self-pay | Source: Home / Self Care | Attending: Internal Medicine

## 2021-09-03 ENCOUNTER — Inpatient Hospital Stay (HOSPITAL_COMMUNITY): Payer: Self-pay | Admitting: Anesthesiology

## 2021-09-03 DIAGNOSIS — I110 Hypertensive heart disease with heart failure: Secondary | ICD-10-CM

## 2021-09-03 DIAGNOSIS — E119 Type 2 diabetes mellitus without complications: Secondary | ICD-10-CM

## 2021-09-03 DIAGNOSIS — R7881 Bacteremia: Secondary | ICD-10-CM

## 2021-09-03 DIAGNOSIS — I081 Rheumatic disorders of both mitral and tricuspid valves: Secondary | ICD-10-CM

## 2021-09-03 DIAGNOSIS — I10 Essential (primary) hypertension: Secondary | ICD-10-CM

## 2021-09-03 DIAGNOSIS — I509 Heart failure, unspecified: Secondary | ICD-10-CM

## 2021-09-03 HISTORY — PX: TEE WITHOUT CARDIOVERSION: SHX5443

## 2021-09-03 LAB — CBC
HCT: 38.7 % — ABNORMAL LOW (ref 39.0–52.0)
Hemoglobin: 13.8 g/dL (ref 13.0–17.0)
MCH: 32.7 pg (ref 26.0–34.0)
MCHC: 35.7 g/dL (ref 30.0–36.0)
MCV: 91.7 fL (ref 80.0–100.0)
Platelets: 225 10*3/uL (ref 150–400)
RBC: 4.22 MIL/uL (ref 4.22–5.81)
RDW: 12.3 % (ref 11.5–15.5)
WBC: 8.7 10*3/uL (ref 4.0–10.5)
nRBC: 0 % (ref 0.0–0.2)

## 2021-09-03 LAB — HEPARIN LEVEL (UNFRACTIONATED): Heparin Unfractionated: 0.37 IU/mL (ref 0.30–0.70)

## 2021-09-03 LAB — APTT: aPTT: 77 seconds — ABNORMAL HIGH (ref 24–36)

## 2021-09-03 LAB — GLUCOSE, CAPILLARY
Glucose-Capillary: 240 mg/dL — ABNORMAL HIGH (ref 70–99)
Glucose-Capillary: 130 mg/dL — ABNORMAL HIGH (ref 70–99)
Glucose-Capillary: 248 mg/dL — ABNORMAL HIGH (ref 70–99)
Glucose-Capillary: 199 mg/dL — ABNORMAL HIGH (ref 70–99)

## 2021-09-03 LAB — LIPID PANEL
Cholesterol: 151 mg/dL (ref 0–200)
HDL: 23 mg/dL — ABNORMAL LOW (ref >40)
LDL Cholesterol: 78 mg/dL (ref 0–99)
Total CHOL/HDL Ratio: 6.6 RATIO
Triglycerides: 248 mg/dL — ABNORMAL HIGH (ref <150)
VLDL: 50 mg/dL — ABNORMAL HIGH (ref 0–40)

## 2021-09-03 SURGERY — ECHOCARDIOGRAM, TRANSESOPHAGEAL
Anesthesia: Monitor Anesthesia Care

## 2021-09-03 MED ORDER — INSULIN GLARGINE-YFGN 100 UNIT/ML ~~LOC~~ SOLN
33.0000 [IU] | Freq: Every day | SUBCUTANEOUS | Status: DC
  Administered 2021-09-03 – 2021-09-04 (×2): 33 [IU] via SUBCUTANEOUS
  Filled 2021-09-03 (×3): qty 0.33

## 2021-09-03 MED ORDER — PROPOFOL 500 MG/50ML IV EMUL
INTRAVENOUS | Status: DC | PRN
  Administered 2021-09-03: 100 ug/kg/min via INTRAVENOUS

## 2021-09-03 MED ORDER — INSULIN ASPART 100 UNIT/ML IJ SOLN
13.0000 [IU] | Freq: Three times a day (TID) | INTRAMUSCULAR | Status: DC
  Administered 2021-09-03 – 2021-09-05 (×7): 13 [IU] via SUBCUTANEOUS

## 2021-09-03 MED ORDER — CHLORHEXIDINE GLUCONATE 4 % EX LIQD
60.0000 mL | Freq: Once | CUTANEOUS | Status: AC
  Administered 2021-09-04: 4 via TOPICAL
  Filled 2021-09-03: qty 60

## 2021-09-03 MED ORDER — POVIDONE-IODINE 10 % EX SWAB: 2.0000 "application " | Freq: Once | CUTANEOUS | Status: DC

## 2021-09-03 MED ORDER — CEFAZOLIN SODIUM-DEXTROSE 2-4 GM/100ML-% IV SOLN
2.0000 g | INTRAVENOUS | Status: DC
  Filled 2021-09-03: qty 100

## 2021-09-03 MED ORDER — SODIUM CHLORIDE 0.9 % IV SOLN
INTRAVENOUS | Status: AC | PRN
  Administered 2021-09-03: 500 mL via INTRAVENOUS

## 2021-09-03 MED ORDER — PHENYLEPHRINE 80 MCG/ML (10ML) SYRINGE FOR IV PUSH (FOR BLOOD PRESSURE SUPPORT)
PREFILLED_SYRINGE | INTRAVENOUS | Status: DC | PRN
  Administered 2021-09-03: 80 ug via INTRAVENOUS
  Administered 2021-09-03 (×2): 160 ug via INTRAVENOUS

## 2021-09-03 MED ORDER — PROPOFOL 10 MG/ML IV BOLUS
INTRAVENOUS | Status: DC | PRN
  Administered 2021-09-03: 20 mg via INTRAVENOUS
  Administered 2021-09-03: 50 mg via INTRAVENOUS

## 2021-09-03 NOTE — Progress Notes (Signed)
PHARMACIST LIPID MONITORING   David [name] is a 61 y.o. male admitted on 08/29/2021 with fatigue, palpitations.  Pharmacy has been consulted to optimize lipid-lowering therapy with the indication of secondary prevention for clinical ASCVD.  Recent Labs:  Lipid Panel (last 6 months):   Lab Results  Component Value Date   CHOL 151 09/03/2021   TRIG 248 (H) 09/03/2021   HDL 23 (L) 09/03/2021   CHOLHDL 6.6 09/03/2021   VLDL 50 (H) 09/03/2021   LDLCALC 78 09/03/2021    Hepatic function panel (last 6 months):   Lab Results  Component Value Date   AST 21 08/30/2021   ALT 17 08/30/2021   ALKPHOS 87 08/30/2021   BILITOT 1.2 08/30/2021    SCr (since admission):   Serum creatinine: 1.11 mg/dL 40/98/11 9147 Estimated creatinine clearance: 78.6 mL/min  Current therapy and lipid therapy tolerance Current lipid-lowering therapy: atorvastatin 80 mg Previous lipid-lowering therapies (if applicable): n/a Documented or reported allergies or intolerances to lipid-lowering therapies (if applicable): n/a  Assessment:   Patient agrees with changes to lipid-lowering therapy  Plan:    1.Statin intensity (high intensity recommended for all patients regardless of the LDL):  Add or increase statin to high intensity.  2.Add ezetimibe (if any one of the following):   Not indicated at this time.  3.Refer to lipid clinic:   No  4.Follow-up with:  Cardiology provider - Charlton Haws, MD  5.Follow-up labs after discharge:  Changes in lipid therapy were made. Check a lipid panel in 8-12 weeks then annually.      Thank you for allowing pharmacy to be a part of this patient's care.  Thelma Barge, PharmD Clinical Pharmacist

## 2021-09-03 NOTE — CV Procedure (Signed)
Brief TEE Note  LVEF 55-60% No LA/LAA thrombus or masses No endocarditis Trivial MR, trivial TR  For additional details see full report.   Inara Dike C. Duke Salvia, MD, [facility] 09/03/2021 10:29 AM

## 2021-09-03 NOTE — H&P (View-Only) (Signed)
Patient ID: David [name], male   DOB: 10/05/1960, 60 y.o.   MRN: [id number] Patient is status post revascularization to the right lower extremity.  He has good dopplerable pulses.  Patient underwent transesophageal echo today.  We will plan for a right foot fourth ray amputation tomorrow Saturday.  Discussed the importance of nonweightbearing postoperatively patient may discharge when safe with therapy after surgery. 

## 2021-09-03 NOTE — Progress Notes (Signed)
   VASCULAR SURGERY ASSESSMENT & PLAN:   S/P PTA RIGHT POSTERIOR TIBIAL ARTERY: This patient underwent an arteriogram yesterday which showed severe tibial artery occlusive disease.  His only runoff was the peroneal artery on the right.  He underwent successful recanalization of an occluded posterior tibial artery and balloon angioplasty by Dr. Myra Gianotti.  Thus his circulation is now optimized.  He does have distal disease but no further options for revascularization.  Vascular surgery will be available as needed.  I have arranged follow-up in our office in 6 weeks.   SUBJECTIVE:   No specific complaints this morning.  PHYSICAL EXAM:   Vitals:   09/03/21 0927 09/03/21 1032 09/03/21 1042 09/03/21 1055  BP: (!) 149/101 107/74 (!) 132/95 (!) 136/95  Pulse: 80 81 71 72  Resp: 20 19 19  (!) 21  Temp: 97.8 F (36.6 C) 98 F (36.7 C)    TempSrc: Oral Temporal    SpO2: 97% 93% 99% 98%  Weight:      Height:       He has a brisk peroneal and posterior tibial signal with the Doppler.  LABS:   Lab Results  Component Value Date   WBC 8.7 09/03/2021   HGB 13.8 09/03/2021   HCT 38.7 (L) 09/03/2021   MCV 91.7 09/03/2021   PLT 225 09/03/2021   Lab Results  Component Value Date   CREATININE 1.11 09/02/2021   Lab Results  Component Value Date   INR 1.2 08/30/2021   CBG (last 3)  Recent Labs    09/02/21 2216 09/03/21 0614 09/03/21 1127  GLUCAP 222* 240* 130*    PROBLEM LIST:    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)   Gangrene of toe of right foot (HCC)   CURRENT MEDS:    aspirin  EC  81 mg Oral Daily   atorvastatin  80 mg Oral Daily   carvedilol  3.125 mg Oral BID WC   Chlorhexidine Gluconate Cloth  6 each Topical Q0600   clopidogrel  75 mg Oral Q breakfast   digoxin  125 mcg Oral Daily   feeding supplement (GLUCERNA SHAKE)  237 mL Oral BID BM   insulin aspart  0-15 Units Subcutaneous TID WC   insulin aspart  0-5 Units Subcutaneous QHS   insulin aspart  13 Units Subcutaneous TID WC   insulin glargine-yfgn  33 Units Subcutaneous QHS   loratadine  10 mg Oral Daily   metroNIDAZOLE  500 mg Oral Q12H   mupirocin ointment  1 application. Nasal BID   sodium chloride flush  3 mL Intravenous Q12H    11/03/21 Office: [phone] 09/03/2021

## 2021-09-03 NOTE — Progress Notes (Signed)
  Echocardiogram 2D Echocardiogram has been performed.  David [name] 09/03/2021, 10:44 AM
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
## 2021-09-03 NOTE — Progress Notes (Signed)
[facility]  Date of Admission:  08/29/2021     Total days of antibiotics 6         ASSESSMENT:  David [name] is POD #1 from aortogram and angioplasty and TEE is without evidence of endocarditis. Scheduled for surgery on 09/04/21 with Dr. [name]. Blood cultures remain without growth since 08/30/21. Continue current dose of vancomycin and therapeutic drug monitoring of vancomycin level and renal function. Final recommendations pending surgical outcomes.   PLAN:  Continue current dose of vancomycin.  Monitor cultures for clearance of bacteremia. Therapeutic drug monitoring of renal function and vancomycin levels. Awaiting surgical intervention planned for tomorrow.  Remaining medical and supportive care per primary team.   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)   Gangrene of toe of right foot (HCC)    [MAR Hold] aspirin EC  81 mg Oral Daily   [MAR Hold] atorvastatin  80 mg Oral Daily   [MAR Hold] carvedilol  3.125 mg Oral BID WC   [MAR Hold] Chlorhexidine Gluconate Cloth  6 each Topical Q0600   [MAR Hold] clopidogrel  75 mg Oral Q breakfast   [MAR Hold] digoxin  125 mcg Oral Daily   [MAR Hold] feeding supplement (GLUCERNA SHAKE)  237 mL Oral BID BM   [MAR Hold] insulin aspart  0-15 Units Subcutaneous TID WC   [MAR Hold] insulin aspart  0-5 Units Subcutaneous QHS   [MAR Hold] insulin aspart  10 Units Subcutaneous TID WC   [MAR Hold] insulin glargine-yfgn  30 Units Subcutaneous QHS   [MAR Hold] loratadine  10 mg Oral Daily   [MAR Hold] metroNIDAZOLE  500 mg Oral  Q12H   [MAR Hold] mupirocin ointment  1 application. Nasal BID   [MAR Hold] sodium chloride flush  3 mL Intravenous Q12H    SUBJECTIVE:  Afebrile overnight with no acute events. Not feeling well today.   No Known Allergies   Review of Systems: Review of Systems  Constitutional:  Negative for chills, fever and weight loss.  Respiratory:  Negative for cough, shortness of breath and wheezing.   Cardiovascular:  Negative for chest pain and leg swelling.  Gastrointestinal:  Negative for abdominal pain, constipation, diarrhea, nausea and vomiting.  Musculoskeletal:        Positive for foot pain.   Skin:  Negative for rash.     OBJECTIVE: Vitals:   09/03/21 0927 09/03/21 1032 09/03/21 1042 09/03/21 1055  BP: (!) 149/101 107/74 (!) 132/95 (!) 136/95  Pulse: 80 81 71 72  Resp: (!) 21  Temp: 97.8 F (36.6 C)     TempSrc: Oral     SpO2: 97% 93% 99% 98%  Weight:      Height:       Body mass index is 23.95 kg/m.  Physical Exam Constitutional:      General: He is not in acute distress.    Appearance: He is well-developed.  Cardiovascular:     Rate and Rhythm: Normal rate and regular rhythm.     Heart sounds: Normal heart sounds.  Pulmonary:  Effort: Pulmonary effort is normal.     Breath sounds: Normal breath sounds.  Musculoskeletal:     Comments: Right foot unchanged from previous exam.   Skin:    General: Skin is warm and dry.  Neurological:     Mental Status: He is alert and oriented to person, place, and time.  Psychiatric:        Mood and Affect: Mood normal.    Lab Results Lab Results  Component Value Date   WBC 8.7 09/03/2021   HGB 13.8 09/03/2021   HCT 38.7 (L) 09/03/2021   MCV 91.7 09/03/2021   PLT 225 09/03/2021    Lab Results  Component Value Date   CREATININE 1.11 09/02/2021   BUN 21 (H) 09/02/2021   NA 133 (L) 09/02/2021   K 4.1 09/02/2021   CL 103 09/02/2021   CO2 23 09/02/2021    Lab Results  Component Value Date   ALT 17  08/30/2021   AST 21 08/30/2021   ALKPHOS 87 08/30/2021   BILITOT 1.2 08/30/2021     Microbiology: Recent Results (from the past 240 hour(s))  Resp Panel by RT-PCR (Flu A&B, Covid) Urine, Clean Catch     Status: None   Collection Time: 08/29/21  7:14 PM   Specimen: Urine, Clean Catch; Nasal Swab  Result Value Ref Range Status   SARS Coronavirus 2 by RT PCR NEGATIVE NEGATIVE Final    Comment: (NOTE) SARS-CoV-2 target nucleic acids are NOT DETECTED.  The SARS-CoV-2 RNA is generally detectable in upper respiratory specimens during the acute phase of infection. The lowest concentration of SARS-CoV-2 viral copies this assay can detect is 138 copies/mL. A negative result does not preclude SARS-Cov-2 infection and should not be used as the sole basis for treatment or other patient management decisions. A negative result may occur with  improper specimen collection/handling, submission of specimen other than nasopharyngeal swab, presence of viral mutation(s) within the areas targeted by this assay, and inadequate number of viral copies(<138 copies/mL). A negative result must be combined with clinical observations, patient history, and epidemiological information. The expected result is Negative.  Fact Sheet for Patients:  BloggerCourse.com  Fact Sheet for Healthcare Providers:  SeriousBroker.it  This test is no t yet approved or cleared by the [place] FDA and  has been authorized for detection and/or diagnosis of SARS-CoV-2 by FDA under an Emergency Use Authorization (EUA). This EUA will remain  in effect (meaning this test can be used) for the duration of the COVID-19 declaration under Section 564(b)(1) of the Act, 21 U.S.C.section 360bbb-3(b)(1), unless the authorization is terminated  or revoked sooner.       Influenza A by PCR NEGATIVE NEGATIVE Final   Influenza B by PCR NEGATIVE NEGATIVE Final    Comment: (NOTE) The  Xpert Xpress SARS-CoV-2/FLU/RSV plus assay is intended as an aid in the diagnosis of influenza from Nasopharyngeal swab specimens and should not be used as a sole basis for treatment. Nasal washings and aspirates are unacceptable for Xpert Xpress SARS-CoV-2/FLU/RSV testing.  Fact Sheet for Patients: BloggerCourse.com  Fact Sheet for Healthcare Providers: SeriousBroker.it  This test is not yet approved or cleared by the [place] FDA and has been authorized for detection and/or diagnosis of SARS-CoV-2 by FDA under an Emergency Use Authorization (EUA). This EUA will remain in effect (meaning this test can be used) for the duration of the COVID-19 declaration under Section 564(b)(1) of the Act, 21 U.S.C. section 360bbb-3(b)(1), unless the authorization is terminated or revoked.  Performed at [facility], [address] [place], [place] [address]   Culture, blood (routine x 2)     Status: Abnormal   Collection Time: 08/29/21  8:05 PM   Specimen: BLOOD  Result Value Ref Range Status   Specimen Description BLOOD RIGHT ANTECUBITAL  Final   Special Requests   Final    BOTTLES DRAWN AEROBIC AND ANAEROBIC Blood Culture adequate volume   Culture  Setup Time   Final    GRAM POSITIVE COCCI IN CLUSTERS IN BOTH AEROBIC AND ANAEROBIC BOTTLES CRITICAL RESULT CALLED TO, READ BACK BY AND VERIFIED WITH: PHARMD FRANK WILSON ON 08/30/21 @ 1943 BY DRT Performed at [facility], [address], [place], [place] [address]    Culture METHICILLIN RESISTANT STAPHYLOCOCCUS AUREUS (A)  Final   Report Status 09/01/2021 FINAL  Final   Organism ID, Bacteria METHICILLIN RESISTANT STAPHYLOCOCCUS AUREUS  Final      Susceptibility   Methicillin resistant staphylococcus aureus - MIC*    CIPROFLOXACIN >=8 RESISTANT Resistant     ERYTHROMYCIN >=8 RESISTANT Resistant     GENTAMICIN <=0.5 SENSITIVE Sensitive     OXACILLIN >=4 RESISTANT Resistant      TETRACYCLINE <=1 SENSITIVE Sensitive     VANCOMYCIN 1 SENSITIVE Sensitive     TRIMETH/SULFA >=320 RESISTANT Resistant     CLINDAMYCIN >=8 RESISTANT Resistant     RIFAMPIN <=0.5 SENSITIVE Sensitive     Inducible Clindamycin NEGATIVE Sensitive     * METHICILLIN RESISTANT STAPHYLOCOCCUS AUREUS  Blood Culture ID Panel (Reflexed)     Status: Abnormal   Collection Time: 08/29/21  8:05 PM  Result Value Ref Range Status   Enterococcus faecalis NOT DETECTED NOT DETECTED Final   Enterococcus Faecium NOT DETECTED NOT DETECTED Final   Listeria monocytogenes NOT DETECTED NOT DETECTED Final   Staphylococcus species DETECTED (A) NOT DETECTED Final    Comment: CRITICAL RESULT CALLED TO, READ BACK BY AND VERIFIED WITH: PHARMD FRANK WILSON ON 08/30/21 @ 1943 BY DRT    Staphylococcus aureus (BCID) DETECTED (A) NOT DETECTED Final    Comment: Methicillin (oxacillin)-resistant Staphylococcus aureus (MRSA). MRSA is predictably resistant to beta-lactam antibiotics (except ceftaroline). Preferred therapy is vancomycin unless clinically contraindicated. Patient requires contact precautions if  hospitalized. CRITICAL RESULT CALLED TO, READ BACK BY AND VERIFIED WITH: PHARMD FRANK WILSON ON 08/30/21 @ 1943 BY DRT    Staphylococcus epidermidis NOT DETECTED NOT DETECTED Final   Staphylococcus lugdunensis NOT DETECTED NOT DETECTED Final   Streptococcus species NOT DETECTED NOT DETECTED Final   Streptococcus agalactiae NOT DETECTED NOT DETECTED Final   Streptococcus pneumoniae NOT DETECTED NOT DETECTED Final   Streptococcus pyogenes NOT DETECTED NOT DETECTED Final   A.calcoaceticus-baumannii NOT DETECTED NOT DETECTED Final   Bacteroides fragilis NOT DETECTED NOT DETECTED Final   Enterobacterales NOT DETECTED NOT DETECTED Final   Enterobacter cloacae complex NOT DETECTED NOT DETECTED Final   Escherichia coli NOT DETECTED NOT DETECTED Final   Klebsiella aerogenes NOT DETECTED NOT DETECTED Final   Klebsiella oxytoca  NOT DETECTED NOT DETECTED Final   Klebsiella pneumoniae NOT DETECTED NOT DETECTED Final   Proteus species NOT DETECTED NOT DETECTED Final   Salmonella species NOT DETECTED NOT DETECTED Final   Serratia marcescens NOT DETECTED NOT DETECTED Final   Haemophilus influenzae NOT DETECTED NOT DETECTED Final   Neisseria meningitidis NOT DETECTED NOT DETECTED Final   Pseudomonas aeruginosa NOT DETECTED NOT DETECTED Final   Stenotrophomonas maltophilia NOT DETECTED NOT DETECTED Final   Candida  albicans NOT DETECTED NOT DETECTED Final   Candida auris NOT DETECTED NOT DETECTED Final   Candida glabrata NOT DETECTED NOT DETECTED Final   Candida krusei NOT DETECTED NOT DETECTED Final   Candida parapsilosis NOT DETECTED NOT DETECTED Final   Candida tropicalis NOT DETECTED NOT DETECTED Final   Cryptococcus neoformans/gattii NOT DETECTED NOT DETECTED Final   Meth resistant mecA/C and MREJ DETECTED (A) NOT DETECTED Final    Comment: CRITICAL RESULT CALLED TO, READ BACK BY AND VERIFIED WITH: PHARMD FRANK WILSON ON 08/30/21 @ 1943 BY DRT Performed at [facility], [address] [place], [place] [address]   Urine Culture     Status: Abnormal   Collection Time: 08/29/21 11:11 PM   Specimen: Urine, Clean Catch  Result Value Ref Range Status   Specimen Description URINE, CLEAN CATCH  Final   Special Requests NONE  Final   Culture (A)  Final    <10,000 COLONIES/mL INSIGNIFICANT GROWTH Performed at [facility], [address] [place], [place] [address]    Report Status 08/30/2021 FINAL  Final  Respiratory (~20 pathogens) panel by PCR     Status: None   Collection Time: 08/30/21 12:04 AM   Specimen: Urine, Clean Catch; Respiratory  Result Value Ref Range Status   Adenovirus NOT DETECTED NOT DETECTED Final   Coronavirus 229E NOT DETECTED NOT DETECTED Final    Comment: (NOTE) The Coronavirus on the Respiratory Panel, DOES NOT test for the novel  Coronavirus (2019 nCoV)    Coronavirus HKU1  NOT DETECTED NOT DETECTED Final   Coronavirus NL63 NOT DETECTED NOT DETECTED Final   Coronavirus OC43 NOT DETECTED NOT DETECTED Final   Metapneumovirus NOT DETECTED NOT DETECTED Final   Rhinovirus / Enterovirus NOT DETECTED NOT DETECTED Final   Influenza A NOT DETECTED NOT DETECTED Final   Influenza B NOT DETECTED NOT DETECTED Final   Parainfluenza Virus 1 NOT DETECTED NOT DETECTED Final   Parainfluenza Virus 2 NOT DETECTED NOT DETECTED Final   Parainfluenza Virus 3 NOT DETECTED NOT DETECTED Final   Parainfluenza Virus 4 NOT DETECTED NOT DETECTED Final   Respiratory Syncytial Virus NOT DETECTED NOT DETECTED Final   Bordetella pertussis NOT DETECTED NOT DETECTED Final   Bordetella Parapertussis NOT DETECTED NOT DETECTED Final   Chlamydophila pneumoniae NOT DETECTED NOT DETECTED Final   Mycoplasma pneumoniae NOT DETECTED NOT DETECTED Final    Comment: Performed at [facility], [address] [place], [place] [address]  Culture, blood (Routine X 2) w Reflex to ID Panel     Status: None (Preliminary result)   Collection Time: 08/30/21  2:32 AM   Specimen: BLOOD RIGHT HAND  Result Value Ref Range Status   Specimen Description BLOOD RIGHT HAND  Final   Special Requests   Final    BOTTLES DRAWN AEROBIC ONLY Blood Culture results may not be optimal due to an inadequate volume of blood received in culture bottles   Culture   Final    NO GROWTH 4 DAYS Performed at [facility], [address] [place], [place] [address]    Report Status PENDING  Incomplete  MRSA Next Gen by PCR, Nasal     Status: Abnormal   Collection Time: 08/30/21  4:33 AM   Specimen: Nasal Mucosa; Nasal Swab  Result Value Ref Range Status   MRSA by PCR Next Gen DETECTED (A) NOT DETECTED Final    Comment: RESULT CALLED TO, READ BACK BY AND VERIFIED WITH:  T IRBY,RN@[id number]  08/30/21 MK (NOTE) The GeneXpert MRSA Assay (FDA approved for NASAL specimens only), is one component of a comprehensive MRSA colonization  surveillance program. It is not intended to diagnose MRSA infection nor to guide or monitor treatment for MRSA infections. Test performance is not FDA approved in patients less than 64 years old. Performed at [facility], [address], [place], [place] [address]   Culture, blood (Routine X 2) w Reflex to ID Panel     Status: None (Preliminary result)   Collection Time: 08/30/21 10:30 PM   Specimen: BLOOD RIGHT HAND  Result Value Ref Range Status   Specimen Description BLOOD RIGHT HAND  Final   Special Requests AEROBIC BOTTLE ONLY Blood Culture adequate volume  Final   Culture   Final    NO GROWTH 4 DAYS Performed at [facility], [address] [place], [place] [address]    Report Status PENDING  Incomplete  Culture, blood (Routine X 2) w Reflex to ID Panel     Status: None (Preliminary result)   Collection Time: 08/30/21 10:30 PM   Specimen: BLOOD LEFT HAND  Result Value Ref Range Status   Specimen Description BLOOD LEFT HAND  Final   Special Requests AEROBIC BOTTLE ONLY Blood Culture adequate volume  Final   Culture   Final    NO GROWTH 4 DAYS Performed at [facility], [address] [place], [place] [address]    Report Status PENDING  Incomplete     Marcos Eke, NP [facility]  09/03/2021  10:55 AM

## 2021-09-03 NOTE — Progress Notes (Signed)
[facility]  Subjective  - No new complaitns   Objective (!) 136/95 72 98 F (36.7 C) (Temporal) (!) 21 98%  Intake/Output Summary (Last 24 hours) at 09/03/2021 1341 Last data filed at 09/03/2021 1017 Gross per 24 hour  Intake 334.27 ml  Output 1400 ml  Net -1065.73 ml    Improved Doppler signal right peroneal/PT with weaker DP 4th toe erythema with edema and pain Left groin soft Lungs non labored breathing  Assessment/Planning: S/P angiogram with PTA angioplasty for sever tibial disease and 4th toe ischemic changes  Improved inflow Dr. [name] has planned 4th toe amputation 09/04/21 F/U in our office in 6 weeks  David [name] 09/03/2021 1:41 PM --  Laboratory Lab Results: Recent Labs    09/02/21 0329 09/03/21 0314  WBC 8.3 8.7  HGB 14.4 13.8  HCT 41.9 38.7*  PLT 210 225   BMET Recent Labs    09/01/21 0454 09/02/21 0329  NA 130* 133*  K 3.7 4.1  CL 104 103  CO2 20* 23  GLUCOSE 189* 204*  BUN 18 21*  CREATININE 1.14 1.11  CALCIUM 8.8* 9.2    COAG Lab Results  Component Value Date   INR 1.2 08/30/2021   INR 1.13 09/26/2017   No results found for: PTT

## 2021-09-03 NOTE — Progress Notes (Signed)
Pharmacy Antibiotic Note  David [name] is a 61 y.o. male admitted on 08/29/2021 with MRSA bacteremia and right foot ulcer. Pharmacy has been consulted for vancomycin dosing. SCr now stabilized and down to 1.11 today. Note plan for 4th ray amputation tomorrow.    Plan: Continue Vancomycin 1000 mg every 12 hours  Ceftriaxone 2 gm IV Q 12 hours  Metronidazole 500mg  Q12h (per MD)  Monitor renal function, LOT post- surgery Consider levels over the weekend now that renal function improved    Height: 6' (182.9 cm) Weight: 80.1 kg (176 lb 9.4 oz) IBW/kg (Calculated) : 77.6  Temp (24hrs), Avg:98.4 F (36.9 C), Min:97.8 F (36.6 C), Max:98.7 F (37.1 C)  Recent Labs  Lab 08/29/21 2005 08/29/21 2230 08/30/21 0004 08/30/21 0232 08/31/21 0224 09/01/21 0454 09/02/21 0329 09/03/21 0314  WBC  --   --  13.9* 12.3* 7.2  --  8.3 8.7  CREATININE  --   --  1.30* 1.30* 1.35* 1.14 1.11  --   LATICACIDVEN 1.4 1.2  --   --   --   --   --   --      Estimated Creatinine Clearance: 78.6 mL/min (by C-G formula based on SCr of 1.11 mg/dL).    No Known Allergies  Antimicrobials this admission: Cefepime 5/28 >> 5/29 Metronidazole 5/29 >>  Vancomycin 5/29 >> Ceftriaxone 5/30>   Microbiology results: 5/28 BCx: MRSA 5/29 Bcx: NGTD    Thank you for allowing pharmacy to be a part of this patient's care.  6/29, PharmD, BCPS, BCIDP Infectious Diseases Clinical Pharmacist Phone: [phone] 09/03/2021 12:55 PM

## 2021-09-03 NOTE — Interval H&P Note (Signed)
History and Physical Interval Note:  09/03/2021 9:47 AM  David [name]  has presented today for surgery, with the diagnosis of BACTERIMIA.  The various methods of treatment have been discussed with the patient and family. After consideration of risks, benefits and other options for treatment, the patient has consented to  Procedure(s): TRANSESOPHAGEAL ECHOCARDIOGRAM (TEE) (N/A) as a surgical intervention.  The patient's history has been reviewed, patient examined, no change in status, stable for surgery.  I have reviewed the patient's chart and labs.  Questions were answered to the patient's satisfaction.     Chilton Si, MD

## 2021-09-03 NOTE — Anesthesia Postprocedure Evaluation (Signed)
Anesthesia Post Note  Patient: David [name]  Procedure(s) Performed: TRANSESOPHAGEAL ECHOCARDIOGRAM (TEE)     Patient location during evaluation: PACU Anesthesia Type: MAC Level of consciousness: awake and alert Pain management: pain level controlled Vital Signs Assessment: post-procedure vital signs reviewed and stable Respiratory status: spontaneous breathing and respiratory function stable Cardiovascular status: stable Postop Assessment: no apparent nausea or vomiting Anesthetic complications: no   No notable events documented.  Last Vitals:  Vitals:   09/03/21 1042 09/03/21 1055  BP: (!) 132/95 (!) 136/95  Pulse: 71 72  Resp: 19 (!) 21  Temp:    SpO2: 99% 98%    Last Pain:  Vitals:   09/03/21 1032  TempSrc: Temporal  PainSc: 0-No pain                 [name]

## 2021-09-03 NOTE — Progress Notes (Signed)
Patient ID: David [name], male   DOB: July 23, 1960, 61 y.o.   MRN: [id number] Patient is status post revascularization to the right lower extremity.  He has good dopplerable pulses.  Patient underwent transesophageal echo today.  We will plan for a right foot fourth ray amputation tomorrow Saturday.  Discussed the importance of nonweightbearing postoperatively patient may discharge when safe with therapy after surgery.

## 2021-09-03 NOTE — Progress Notes (Signed)
[place] for Heparin (holding PTA Eliquis for procedure) Indication: atrial fibrillation  No Known Allergies  Patient Measurements: Height: 6' (182.9 cm) Weight: 80.1 kg (176 lb 9.4 oz) IBW/kg (Calculated) : 77.6  Heparin Dosing Weight: 80 kg  Vital Signs: Temp: 98 F (36.7 C) (06/02 1032) Temp Source: Temporal (06/02 1032) BP: 136/95 (06/02 1055) Pulse Rate: 72 (06/02 1055)  Labs: Recent Labs    09/01/21 0454 09/01/21 1358 09/02/21 0329 09/03/21 0314  HGB  --   --  14.4 13.8  HCT  --   --  41.9 38.7*  PLT  --   --  210 225  APTT 64* 78* 59* 77*  HEPARINUNFRC 1.04* 0.94* 0.59 0.37  CREATININE 1.14  --  1.11  --     Estimated Creatinine Clearance: 78.6 mL/min (by C-G formula based on SCr of 1.11 mg/dL).   Assessment: 61 year old male with history of chronic A-fib on Eliquis PTA. Eliquis currently held in preparation for arteriogram planned on Thursday 6/01. Last dose of Eliquis 5/30 @ 1000. Pharmacy consulted to manage heparin while apixaban on hold.  Today the heparin level remains falsely elevated. aPTT  is subtherapeutic 59 this AM on heparin 1400 units/hr. CBC stable.  Heparin drip stopped ~ 0700-0800 today prior to procedure.  Now S/p abdominal aortogram, and right posterior tibial artery angioplasty. Vascular surgery PA has ordered to restart hepairn drip at [facility] [place] [facility]. No issues with bleeding reported.   Heparin level therapeutic 0.37 (aPTT 77) on heparin 1550 units/hr. No issues with heparin infusion or signs of bleeding reported.    Goal of Therapy:  Heparin level 0.3-0.7 units/ml aPTT 66-102 seconds Monitor platelets by anticoagulation protocol: Yes   Plan:  Continue heparin infusion at 1550 units/hr Check heparin level in 6 hours and daily while on heparin Continue to monitor H&H and platelets    Thank you for allowing pharmacy to be a part of this patient's care.  Ardyth Harps, PharmD Clinical  Pharmacist

## 2021-09-03 NOTE — Anesthesia Preprocedure Evaluation (Addendum)
Anesthesia Evaluation  Patient identified by MRN, date of birth, ID band Patient awake    Reviewed: Allergy & Precautions, NPO status , Patient's Chart, lab work & pertinent test results  Airway Mallampati: II  TM Distance: >3 FB Neck ROM: Full    Dental no notable dental hx.    Pulmonary sleep apnea , former smoker,    breath sounds clear to auscultation + decreased breath sounds      Cardiovascular Exercise Tolerance: Good hypertension, Pt. on medications +CHF (EF 45%)  Normal cardiovascular exam+ dysrhythmias Atrial Fibrillation  Rhythm:Regular Rate:Normal     Neuro/Psych negative neurological ROS  negative psych ROS   GI/Hepatic negative GI ROS, Neg liver ROS,   Endo/Other  negative endocrine ROSdiabetes, Type 2, Insulin Dependent  Renal/GU negative Renal ROS  negative genitourinary   Musculoskeletal negative musculoskeletal ROS (+)   Abdominal   Peds negative pediatric ROS (+)  Hematology negative hematology ROS (+)   Anesthesia Other Findings   Reproductive/Obstetrics negative OB ROS                            Anesthesia Physical Anesthesia Plan  ASA: 3  Anesthesia Plan: MAC   Post-op Pain Management:    Induction: Intravenous  PONV Risk Score and Plan: 1 and Propofol infusion, Treatment may vary due to age or medical condition, TIVA and Ondansetron  Airway Management Planned: Natural Airway and Nasal Cannula  Additional Equipment:   Intra-op Plan:   Post-operative Plan:   Informed Consent: I have reviewed the patients History and Physical, chart, labs and discussed the procedure including the risks, benefits and alternatives for the proposed anesthesia with the patient or authorized representative who has indicated his/her understanding and acceptance.     Dental advisory given  Plan Discussed with: CRNA, Anesthesiologist and Surgeon  Anesthesia Plan Comments:          Anesthesia Quick Evaluation

## 2021-09-03 NOTE — Progress Notes (Signed)
Inpatient Diabetes Program Recommendations  AACE/ADA: New Consensus Statement on Inpatient Glycemic Control  Target Ranges:  Prepandial:   less than 140 mg/dL      Peak postprandial:   less than 180 mg/dL (1-2 hours)      Critically ill patients:  140 - 180 mg/dL    Latest Reference Range & Units 09/02/21 06:07 09/02/21 11:54 09/02/21 16:15 09/02/21 22:16 09/03/21 06:14  Glucose-Capillary 70 - 99 mg/dL 768 (H) 115 (H) 726 (H) 222 (H) 240 (H)   Review of Glycemic Control  Diabetes history: DM2 Outpatient Diabetes medications: Basaglar 16 units BID, Novolog 10-12 units TID Current orders for Inpatient glycemic control: Semglee 30 units QHS, Novolog 0-15 units TID with meals, Novolog 0-5 units QHS, Novolog 10 units TID with meals   Inpatient Diabetes Program Recommendations:     Insulin: Please consider increasing Semglee to 33 units QHS and meal coverage to Novolog 13 units TID with meals  Thanks, Orlando Penner, RN, MSN, CDCES Diabetes Coordinator Inpatient Diabetes Program [phone] (Team Pager from 8am to 5pm)

## 2021-09-03 NOTE — Transfer of Care (Signed)
Immediate Anesthesia Transfer of Care Note  Patient: David [name]  Procedure(s) Performed: TRANSESOPHAGEAL ECHOCARDIOGRAM (TEE)  Patient Location: PACU and Endoscopy Unit  Anesthesia Type:MAC  Level of Consciousness: drowsy  Airway & Oxygen Therapy: Patient Spontanous Breathing  Post-op Assessment: Report given to RN and Post -op Vital signs reviewed and stable  Post vital signs: Reviewed and stable  Last Vitals:  Vitals Value Taken Time  BP 107/74 09/03/21 1032  Temp    Pulse 71 09/03/21 1034  Resp 20 09/03/21 1034  SpO2 96 % 09/03/21 1034  Vitals shown include unvalidated device data.  Last Pain:  Vitals:   09/03/21 1032  TempSrc:   PainSc: 0-No pain      Patients Stated Pain Goal: 0 (92/01/00 7121)  Complications: No notable events documented.

## 2021-09-04 ENCOUNTER — Inpatient Hospital Stay (HOSPITAL_COMMUNITY): Payer: Self-pay | Admitting: Anesthesiology

## 2021-09-04 ENCOUNTER — Encounter (HOSPITAL_COMMUNITY)
Admission: EM | Disposition: A | Discharge status: Home Health | Payer: Self-pay | Source: Home / Self Care | Attending: Internal Medicine

## 2021-09-04 ENCOUNTER — Encounter (HOSPITAL_COMMUNITY): Payer: Self-pay | Admitting: Internal Medicine

## 2021-09-04 DIAGNOSIS — Z794 Long term (current) use of insulin: Secondary | ICD-10-CM

## 2021-09-04 DIAGNOSIS — I509 Heart failure, unspecified: Secondary | ICD-10-CM

## 2021-09-04 DIAGNOSIS — E1152 Type 2 diabetes mellitus with diabetic peripheral angiopathy with gangrene: Secondary | ICD-10-CM

## 2021-09-04 DIAGNOSIS — I110 Hypertensive heart disease with heart failure: Secondary | ICD-10-CM

## 2021-09-04 HISTORY — PX: AMPUTATION: SHX166

## 2021-09-04 LAB — GLUCOSE, CAPILLARY
Glucose-Capillary: 325 mg/dL — ABNORMAL HIGH (ref 70–99)
Glucose-Capillary: 246 mg/dL — ABNORMAL HIGH (ref 70–99)
Glucose-Capillary: 228 mg/dL — ABNORMAL HIGH (ref 70–99)
Glucose-Capillary: 180 mg/dL — ABNORMAL HIGH (ref 70–99)
Glucose-Capillary: 199 mg/dL — ABNORMAL HIGH (ref 70–99)
Glucose-Capillary: 302 mg/dL — ABNORMAL HIGH (ref 70–99)

## 2021-09-04 LAB — CULTURE, BLOOD (ROUTINE X 2)
Culture: NO GROWTH
Culture: NO GROWTH
Culture: NO GROWTH
Special Requests: ADEQUATE
Special Requests: ADEQUATE

## 2021-09-04 LAB — CBC
HCT: 40.2 % (ref 39.0–52.0)
Hemoglobin: 14 g/dL (ref 13.0–17.0)
MCH: 31.9 pg (ref 26.0–34.0)
MCHC: 34.8 g/dL (ref 30.0–36.0)
MCV: 91.6 fL (ref 80.0–100.0)
Platelets: 240 10*3/uL (ref 150–400)
RBC: 4.39 MIL/uL (ref 4.22–5.81)
RDW: 12.2 % (ref 11.5–15.5)
WBC: 10.4 10*3/uL (ref 4.0–10.5)
nRBC: 0 % (ref 0.0–0.2)

## 2021-09-04 LAB — HEPARIN LEVEL (UNFRACTIONATED): Heparin Unfractionated: 0.35 IU/mL (ref 0.30–0.70)

## 2021-09-04 LAB — APTT: aPTT: 68 seconds — ABNORMAL HIGH (ref 24–36)

## 2021-09-04 LAB — VANCOMYCIN, PEAK: Vancomycin Pk: 13 ug/mL — ABNORMAL LOW (ref 30–40)

## 2021-09-04 SURGERY — AMPUTATION, FOOT, RAY
Anesthesia: Monitor Anesthesia Care | Site: Fourth Toe | Laterality: Right

## 2021-09-04 MED ORDER — BUPIVACAINE-EPINEPHRINE (PF) 0.5% -1:200000 IJ SOLN
INTRAMUSCULAR | Status: DC | PRN
  Administered 2021-09-04: 30 mL via PERINEURAL

## 2021-09-04 MED ORDER — MIDAZOLAM HCL 2 MG/2ML IJ SOLN
INTRAMUSCULAR | Status: DC | PRN
  Administered 2021-09-04: 1 mg via INTRAVENOUS

## 2021-09-04 MED ORDER — HYDROMORPHONE HCL 1 MG/ML IJ SOLN: 0.2500 mg | INTRAMUSCULAR | Status: DC | PRN

## 2021-09-04 MED ORDER — ACETAMINOPHEN 325 MG PO TABS: 325.0000 mg | ORAL_TABLET | Freq: Four times a day (QID) | ORAL | Status: DC | PRN

## 2021-09-04 MED ORDER — 0.9 % SODIUM CHLORIDE (POUR BTL) OPTIME
TOPICAL | Status: DC | PRN
  Administered 2021-09-04: 1000 mL

## 2021-09-04 MED ORDER — MIDAZOLAM HCL 2 MG/2ML IJ SOLN
INTRAMUSCULAR | Status: AC
  Filled 2021-09-04: qty 2

## 2021-09-04 MED ORDER — CHLORHEXIDINE GLUCONATE 0.12 % MT SOLN
OROMUCOSAL | Status: AC
  Administered 2021-09-04: 15 mL via OROMUCOSAL
  Filled 2021-09-04: qty 15

## 2021-09-04 MED ORDER — SPIRONOLACTONE 25 MG PO TABS
25.0000 mg | ORAL_TABLET | Freq: Every day | ORAL | Status: DC
  Administered 2021-09-04 – 2021-09-06 (×3): 25 mg via ORAL
  Filled 2021-09-04 (×3): qty 1

## 2021-09-04 MED ORDER — CHLORHEXIDINE GLUCONATE 0.12 % MT SOLN: 15.0000 mL | Freq: Once | OROMUCOSAL | Status: AC

## 2021-09-04 MED ORDER — FENTANYL CITRATE (PF) 100 MCG/2ML IJ SOLN
INTRAMUSCULAR | Status: AC
  Filled 2021-09-04: qty 2

## 2021-09-04 MED ORDER — MAGNESIUM SULFATE 2 GM/50ML IV SOLN: 2.0000 g | Freq: Every day | INTRAVENOUS | Status: DC | PRN

## 2021-09-04 MED ORDER — HYDROCODONE-ACETAMINOPHEN 5-325 MG PO TABS
1.0000 | ORAL_TABLET | ORAL | Status: DC | PRN
  Filled 2021-09-04: qty 2

## 2021-09-04 MED ORDER — LIDOCAINE 2% (20 MG/ML) 5 ML SYRINGE
INTRAMUSCULAR | Status: DC | PRN
  Administered 2021-09-04: 40 mg via INTRAVENOUS

## 2021-09-04 MED ORDER — VANCOMYCIN HCL 1500 MG/300ML IV SOLN
1500.0000 mg | Freq: Two times a day (BID) | INTRAVENOUS | Status: DC
  Administered 2021-09-05 – 2021-09-06 (×3): 1500 mg via INTRAVENOUS
  Filled 2021-09-04 (×4): qty 300

## 2021-09-04 MED ORDER — PANTOPRAZOLE SODIUM 40 MG PO TBEC
40.0000 mg | DELAYED_RELEASE_TABLET | Freq: Every day | ORAL | Status: DC
  Administered 2021-09-05 – 2021-09-06 (×2): 40 mg via ORAL
  Filled 2021-09-04 (×2): qty 1

## 2021-09-04 MED ORDER — BISACODYL 5 MG PO TBEC: 5.0000 mg | DELAYED_RELEASE_TABLET | Freq: Every day | ORAL | Status: DC | PRN

## 2021-09-04 MED ORDER — SODIUM CHLORIDE 0.9 % IV SOLN: INTRAVENOUS | Status: DC

## 2021-09-04 MED ORDER — ALUM & MAG HYDROXIDE-SIMETH 200-200-20 MG/5ML PO SUSP: 15.0000 mL | ORAL | Status: DC | PRN

## 2021-09-04 MED ORDER — ZINC SULFATE 220 (50 ZN) MG PO CAPS
220.0000 mg | ORAL_CAPSULE | Freq: Every day | ORAL | Status: DC
  Administered 2021-09-04 – 2021-09-06 (×3): 220 mg via ORAL
  Filled 2021-09-04 (×3): qty 1

## 2021-09-04 MED ORDER — HYDROCODONE-ACETAMINOPHEN 7.5-325 MG PO TABS
1.0000 | ORAL_TABLET | ORAL | Status: DC | PRN
  Administered 2021-09-04 – 2021-09-06 (×6): 2 via ORAL
  Filled 2021-09-04 (×6): qty 2

## 2021-09-04 MED ORDER — GUAIFENESIN-DM 100-10 MG/5ML PO SYRP: 15.0000 mL | ORAL_SOLUTION | ORAL | Status: DC | PRN

## 2021-09-04 MED ORDER — MORPHINE SULFATE (PF) 2 MG/ML IV SOLN: 0.5000 mg | INTRAVENOUS | Status: DC | PRN

## 2021-09-04 MED ORDER — POLYETHYLENE GLYCOL 3350 17 G PO PACK: 17.0000 g | PACK | Freq: Every day | ORAL | Status: DC | PRN

## 2021-09-04 MED ORDER — FUROSEMIDE 40 MG PO TABS
40.0000 mg | ORAL_TABLET | Freq: Every day | ORAL | Status: DC
  Administered 2021-09-04 – 2021-09-06 (×3): 40 mg via ORAL
  Filled 2021-09-04 (×3): qty 1

## 2021-09-04 MED ORDER — ASCORBIC ACID 500 MG PO TABS
1000.0000 mg | ORAL_TABLET | Freq: Every day | ORAL | Status: DC
  Administered 2021-09-04 – 2021-09-06 (×3): 1000 mg via ORAL
  Filled 2021-09-04 (×3): qty 2

## 2021-09-04 MED ORDER — SACUBITRIL-VALSARTAN 49-51 MG PO TABS
1.0000 | ORAL_TABLET | Freq: Two times a day (BID) | ORAL | Status: DC
  Administered 2021-09-04 – 2021-09-06 (×5): 1 via ORAL
  Filled 2021-09-04 (×5): qty 1

## 2021-09-04 MED ORDER — PROPOFOL 10 MG/ML IV BOLUS
INTRAVENOUS | Status: AC
  Filled 2021-09-04: qty 20

## 2021-09-04 MED ORDER — PHENYLEPHRINE 80 MCG/ML (10ML) SYRINGE FOR IV PUSH (FOR BLOOD PRESSURE SUPPORT)
PREFILLED_SYRINGE | INTRAVENOUS | Status: DC | PRN
  Administered 2021-09-04: 80 ug via INTRAVENOUS

## 2021-09-04 MED ORDER — LACTATED RINGERS IV SOLN: INTRAVENOUS | Status: DC

## 2021-09-04 MED ORDER — PHENYLEPHRINE 80 MCG/ML (10ML) SYRINGE FOR IV PUSH (FOR BLOOD PRESSURE SUPPORT)
PREFILLED_SYRINGE | INTRAVENOUS | Status: AC
Start: 2021-09-04 — End: ?
  Filled 2021-09-04: qty 10

## 2021-09-04 MED ORDER — INSULIN ASPART 100 UNIT/ML IJ SOLN
0.0000 [IU] | INTRAMUSCULAR | Status: DC | PRN
  Administered 2021-09-04: 6 [IU] via SUBCUTANEOUS

## 2021-09-04 MED ORDER — JUVEN PO PACK
1.0000 | PACK | Freq: Two times a day (BID) | ORAL | Status: DC
  Administered 2021-09-04 – 2021-09-06 (×6): 1 via ORAL
  Filled 2021-09-04 (×5): qty 1

## 2021-09-04 MED ORDER — LABETALOL HCL 5 MG/ML IV SOLN: 10.0000 mg | INTRAVENOUS | Status: DC | PRN

## 2021-09-04 MED ORDER — FENTANYL CITRATE (PF) 250 MCG/5ML IJ SOLN
INTRAMUSCULAR | Status: DC | PRN
  Administered 2021-09-04: 100 ug via INTRAVENOUS

## 2021-09-04 MED ORDER — APIXABAN 5 MG PO TABS
5.0000 mg | ORAL_TABLET | Freq: Two times a day (BID) | ORAL | Status: DC
  Administered 2021-09-04 – 2021-09-06 (×5): 5 mg via ORAL
  Filled 2021-09-04 (×5): qty 1

## 2021-09-04 MED ORDER — LIDOCAINE 2% (20 MG/ML) 5 ML SYRINGE
INTRAMUSCULAR | Status: AC
  Filled 2021-09-04: qty 5

## 2021-09-04 MED ORDER — METOPROLOL TARTRATE 5 MG/5ML IV SOLN: 2.0000 mg | INTRAVENOUS | Status: DC | PRN

## 2021-09-04 MED ORDER — POTASSIUM CHLORIDE CRYS ER 20 MEQ PO TBCR: 20.0000 meq | EXTENDED_RELEASE_TABLET | Freq: Every day | ORAL | Status: DC | PRN

## 2021-09-04 MED ORDER — ORAL CARE MOUTH RINSE: 15.0000 mL | Freq: Once | OROMUCOSAL | Status: AC

## 2021-09-04 MED ORDER — ONDANSETRON HCL 4 MG/2ML IJ SOLN
4.0000 mg | Freq: Four times a day (QID) | INTRAMUSCULAR | Status: DC | PRN
  Administered 2021-09-05: 4 mg via INTRAVENOUS
  Filled 2021-09-04: qty 2

## 2021-09-04 MED ORDER — DOCUSATE SODIUM 100 MG PO CAPS
100.0000 mg | ORAL_CAPSULE | Freq: Every day | ORAL | Status: DC
  Administered 2021-09-05 – 2021-09-06 (×2): 100 mg via ORAL
  Filled 2021-09-04 (×2): qty 1

## 2021-09-04 MED ORDER — FENTANYL CITRATE (PF) 250 MCG/5ML IJ SOLN
INTRAMUSCULAR | Status: AC
  Filled 2021-09-04: qty 5

## 2021-09-04 MED ORDER — HYDRALAZINE HCL 20 MG/ML IJ SOLN: 5.0000 mg | INTRAMUSCULAR | Status: DC | PRN

## 2021-09-04 MED ORDER — PROPOFOL 10 MG/ML IV BOLUS
INTRAVENOUS | Status: DC | PRN
  Administered 2021-09-04 (×2): 20 mg via INTRAVENOUS
  Administered 2021-09-04: 50 mg via INTRAVENOUS

## 2021-09-04 MED ORDER — MAGNESIUM CITRATE PO SOLN
1.0000 | Freq: Once | ORAL | Status: DC | PRN
Start: 2021-09-04 — End: 2021-09-07

## 2021-09-04 MED ORDER — PHENOL 1.4 % MT LIQD
1.0000 | OROMUCOSAL | Status: DC | PRN
Start: 2021-09-04 — End: 2021-09-07

## 2021-09-04 SURGICAL SUPPLY — 30 items
BAG COUNTER SPONGE SURGICOUNT (BAG) ×3 IMPLANT
BAG SPNG CNTER NS LX DISP (BAG) ×1
BLADE SAW SGTL MED 73X18.5 STR (BLADE) IMPLANT
BLADE SURG 21 STRL SS (BLADE) ×3 IMPLANT
BNDG COHESIVE 4X5 TAN ST LF (GAUZE/BANDAGES/DRESSINGS) ×1 IMPLANT
BNDG COHESIVE 4X5 TAN STRL (GAUZE/BANDAGES/DRESSINGS) ×2 IMPLANT
BNDG GAUZE ELAST 4 BULKY (GAUZE/BANDAGES/DRESSINGS) ×3 IMPLANT
COVER SURGICAL LIGHT HANDLE (MISCELLANEOUS) ×5 IMPLANT
DRAPE U-SHAPE 47X51 STRL (DRAPES) ×5 IMPLANT
DRSG ADAPTIC 3X8 NADH LF (GAUZE/BANDAGES/DRESSINGS) ×3 IMPLANT
DRSG PAD ABDOMINAL 8X10 ST (GAUZE/BANDAGES/DRESSINGS) ×5 IMPLANT
DURAPREP 26ML APPLICATOR (WOUND CARE) ×3 IMPLANT
ELECT REM PT RETURN 9FT ADLT (ELECTROSURGICAL) ×2
ELECTRODE REM PT RTRN 9FT ADLT (ELECTROSURGICAL) ×2 IMPLANT
GAUZE SPONGE 4X4 12PLY STRL (GAUZE/BANDAGES/DRESSINGS) ×3 IMPLANT
GLOVE BIOGEL PI IND STRL 9 (GLOVE) ×2 IMPLANT
GLOVE BIOGEL PI INDICATOR 9 (GLOVE) ×1
GLOVE SURG ORTHO 9.0 STRL STRW (GLOVE) ×3 IMPLANT
GOWN STRL REUS W/ TWL XL LVL3 (GOWN DISPOSABLE) ×4 IMPLANT
GOWN STRL REUS W/TWL XL LVL3 (GOWN DISPOSABLE) ×4
KIT BASIN OR (CUSTOM PROCEDURE TRAY) ×3 IMPLANT
KIT TURNOVER KIT B (KITS) ×3 IMPLANT
NS IRRIG 1000ML POUR BTL (IV SOLUTION) ×3 IMPLANT
PACK ORTHO EXTREMITY (CUSTOM PROCEDURE TRAY) ×3 IMPLANT
PAD ARMBOARD 7.5X6 YLW CONV (MISCELLANEOUS) ×5 IMPLANT
STOCKINETTE IMPERVIOUS LG (DRAPES) IMPLANT
SUT ETHILON 2 0 PSLX (SUTURE) ×3 IMPLANT
TOWEL GREEN STERILE (TOWEL DISPOSABLE) ×3 IMPLANT
TUBE CONNECTING 12X1/4 (SUCTIONS) ×3 IMPLANT
YANKAUER SUCT BULB TIP NO VENT (SUCTIONS) ×3 IMPLANT

## 2021-09-04 NOTE — Op Note (Signed)
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    The risks benefits and alternatives were discussed with the patient preoperatively including but not limited to the risks of infection, bleeding, nerve injury, cardiopulmonary complications, the need for revision surgery, among others, and the patient was willing to proceed.  OPERATIVE IMPLANTS: None  @ENCIMAGES @  OPERATIVE FINDINGS: Good petechial bleeding along the wound edges.  Margins were clear  OPERATIVE PROCEDURE: Patient was brought the operating room after undergoing a regional anesthetic.  After adequate levels anesthesia were obtained patient's right lower extremity was prepped using DuraPrep draped into a sterile field a timeout was called.  A V incision was made around the fourth toe the ray was resected through the midshaft of the metatarsal fourth toe the necrotic toe was resected in 1 block of tissue.  The tissue margins were clear good petechial bleeding.  The wound was irrigated with normal saline electrocautery was used for hemostasis.  Incision closed using 2-0 nylon a sterile dressing was applied patient was taken the PACU in stable condition   DISCHARGE PLANNING:  Antibiotic duration: Continue antibiotics for 24 hours  Weightbearing: Nonweightbearing on the right  Pain medication: Opioid pathway  Dressing care/ Wound VAC: Dry dressing reinforce as needed  Ambulatory devices: Walker or crutches  Discharge to: Anticipate discharge to home  Follow-up: In the office 1 week post operative.

## 2021-09-04 NOTE — Anesthesia Postprocedure Evaluation (Signed)
Anesthesia Post Note  Patient: David [name]  Procedure(s) Performed: 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 (Right: Fourth Toe)     Patient location during evaluation: PACU Anesthesia Type: Regional and MAC Level of consciousness: awake and alert Pain management: pain level controlled Vital Signs Assessment: post-procedure vital signs reviewed and stable Respiratory status: spontaneous breathing, nonlabored ventilation and respiratory function stable Cardiovascular status: stable and blood pressure returned to baseline Postop Assessment: no apparent nausea or vomiting Anesthetic complications: no   No notable events documented.  Last Vitals:  Vitals:   09/04/21 0840 09/04/21 0900  BP: 140/85 (!) 131/92  Pulse: 77 73  Resp: (!) 27 (!) 22  Temp: 36.8 C 36.8 C  SpO2: 96% 98%    Last Pain:  Vitals:   09/04/21 0900  TempSrc: Oral  PainSc: 0-No pain                 Clemence Lengyel,W. EDMOND

## 2021-09-04 NOTE — Anesthesia Procedure Notes (Signed)
Procedure Name: MAC Date/Time: 09/04/2021 7:45 AM Performed by: Lorie Phenix, CRNA Pre-anesthesia Checklist: Patient identified, Emergency Drugs available, Suction available and Patient being monitored Oxygen Delivery Method: Nasal cannula Placement Confirmation: positive ETCO2

## 2021-09-04 NOTE — Progress Notes (Signed)
Pt back from OR. Right foot wrapped clean and dry . A/O X4,      09/04/21 0900  Vitals  Temp 98.3 F (36.8 C)  Temp Source Oral  BP (!) 131/92  MAP (mmHg) 101  BP Location Right Arm  BP Method Automatic  Patient Position (if appropriate) Lying  Pulse Rate 73  Pulse Rate Source Monitor  ECG Heart Rate 78  Resp (!) 22  Level of Consciousness  Level of Consciousness Alert  Oxygen Therapy  SpO2 98 %  O2 Device Room Air  O2 Flow Rate (L/min) 0 L/min  Pain Assessment  Pain Scale 0-10  Pain Score 0  MEWS Score  MEWS Temp 0  MEWS Systolic 0  MEWS Pulse 0  MEWS RR 1  MEWS LOC 0  MEWS Score 1  MEWS Score Color Green

## 2021-09-04 NOTE — Progress Notes (Signed)
Pharmacy Antibiotic Note  David [name] is a 61 y.o. male admitted on 08/29/2021 with MRSA bacteremia and right foot ulcer. Pharmacy has been consulted for vancomycin dosing. Pt s/p amputation today. Vancomycin peak and trough ordered - peak is subtherapeutic at 13 mcg/ml, will empirically adjust without trough since AUC will be subtherapeutic.   Plan: Increase vancomycin to 1500mg  IV q12h BMET tomorrow, vancomycin levels Monday   Height: 6' (182.9 cm) Weight: 81.6 kg (180 lb) IBW/kg (Calculated) : 77.6  Temp (24hrs), Avg:98.4 F (36.9 C), Min:97.7 F (36.5 C), Max:99.1 F (37.3 C)  Recent Labs  Lab 08/29/21 2005 08/29/21 2230 08/30/21 0004 08/30/21 0232 08/31/21 0224 09/01/21 0454 09/02/21 0329 09/03/21 0314 09/04/21 0419 09/04/21 1757  WBC  --   --  13.9* 12.3* 7.2  --  8.3 8.7 10.4  --   CREATININE  --   --  1.30* 1.30* 1.35* 1.14 1.11  --   --   --   LATICACIDVEN 1.4 1.2  --   --   --   --   --   --   --   --   VANCOPEAK  --   --   --   --   --   --   --   --   --  13*     Estimated Creatinine Clearance: 78.6 mL/min (by C-G formula based on SCr of 1.11 mg/dL).    No Known Allergies  Antimicrobials this admission: Cefepime 5/28 >> 5/29 Metronidazole 5/29 >>  Vancomycin 5/29 >> Ceftriaxone 5/30>   Microbiology results: 5/28 BCx: MRSA 5/29 Bcx: NGTD    Thank you for allowing pharmacy to be a part of this patient's care.  6/29, PharmD, BCPS, [facility] Clinical Pharmacist [phone] Please check AMION for all [facility] Pharmacy numbers 09/04/2021

## 2021-09-04 NOTE — Transfer of Care (Signed)
Immediate Anesthesia Transfer of Care Note  Patient: David [name]  Procedure(s) Performed: 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 (Right: Fourth Toe)  Patient Location: PACU  Anesthesia Type:MAC and Regional  Level of Consciousness: awake and alert   Airway & Oxygen Therapy: Patient Spontanous Breathing  Post-op Assessment: Report given to RN and Post -op Vital signs reviewed and stable  Post vital signs: Reviewed and stable  Last Vitals:  Vitals Value Taken Time  BP 116/84 09/04/21 0817  Temp 37.4 C 09/04/21 0817  Pulse 79 09/04/21 0826  Resp 23 09/04/21 0826  SpO2 96 % 09/04/21 0826  Vitals shown include unvalidated device data.  Last Pain:  Vitals:   09/04/21 0458  TempSrc: Oral  PainSc:       Patients Stated Pain Goal: 0 (11/73/56 7014)  Complications: No notable events documented.
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
## 2021-09-04 NOTE — Progress Notes (Signed)
Orthopedic Tech Progress Note Patient Details:  David [name] [birth year] [id number]  Ortho Devices Type of Ortho Device: Postop shoe/boot Ortho Device/Splint Location: Right foot Ortho Device/Splint Interventions: Application   Post Interventions Patient Tolerated: Well  David [name] [name] 09/04/2021, 9:10 AM

## 2021-09-04 NOTE — Anesthesia Preprocedure Evaluation (Addendum)
Anesthesia Evaluation  Patient identified by MRN, date of birth, ID band Patient awake    Reviewed: Allergy & Precautions, H&P , NPO status , Patient's Chart, lab work & pertinent test results, reviewed documented beta blocker date and time   Airway Mallampati: II  TM Distance: >3 FB Neck ROM: Full    Dental no notable dental hx. (+) Teeth Intact, Dental Advisory Given   Pulmonary sleep apnea , former smoker,    Pulmonary exam normal breath sounds clear to auscultation       Cardiovascular hypertension, Pt. on medications and Pt. on home beta blockers +CHF  + dysrhythmias Atrial Fibrillation  Rhythm:Regular Rate:Normal     Neuro/Psych negative neurological ROS  negative psych ROS   GI/Hepatic negative GI ROS, Neg liver ROS,   Endo/Other  diabetes, Insulin Dependent  Renal/GU negative Renal ROS  negative genitourinary   Musculoskeletal   Abdominal   Peds  Hematology negative hematology ROS (+)   Anesthesia Other Findings   Reproductive/Obstetrics negative OB ROS                            Anesthesia Physical Anesthesia Plan  ASA: 3  Anesthesia Plan: Regional and MAC   Post-op Pain Management: Tylenol PO (pre-op)* and Minimal or no pain anticipated   Induction: Intravenous  PONV Risk Score and Plan: 1 and Propofol infusion, Midazolam and Ondansetron  Airway Management Planned: Natural Airway and Simple Face Mask  Additional Equipment:   Intra-op Plan:   Post-operative Plan:   Informed Consent: I have reviewed the patients History and Physical, chart, labs and discussed the procedure including the risks, benefits and alternatives for the proposed anesthesia with the patient or authorized representative who has indicated his/her understanding and acceptance.     Dental advisory given  Plan Discussed with: CRNA  Anesthesia Plan Comments:         Anesthesia Quick  Evaluation

## 2021-09-04 NOTE — Interval H&P Note (Signed)
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  The various methods of treatment have been discussed with the patient and family. 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  The patient's history has been reviewed, patient examined, no change in status, stable for surgery.  I have reviewed the patient's chart and labs.  Questions were answered to the patient's satisfaction.     Nadara Mustard

## 2021-09-04 NOTE — Progress Notes (Signed)
[place] for Heparin (holding PTA Eliquis for procedure) Indication: atrial fibrillation  No Known Allergies  Patient Measurements: Height: 6' (182.9 cm) Weight: 81.6 kg (180 lb) IBW/kg (Calculated) : 77.6  Heparin Dosing Weight: 80 kg  Vital Signs: Temp: 98.3 F (36.8 C) (06/03 0840) Temp Source: Oral (06/03 0458) BP: 140/85 (06/03 0840) Pulse Rate: 77 (06/03 0840)  Labs: Recent Labs    09/02/21 0329 09/03/21 0314 09/04/21 0419  HGB 14.4 13.8 14.0  HCT 41.9 38.7* 40.2  PLT 210 225 240  APTT 59* 77* 68*  HEPARINUNFRC 0.59 0.37 0.35  CREATININE 1.11  --   --      Estimated Creatinine Clearance: 78.6 mL/min (by C-G formula based on SCr of 1.11 mg/dL).   Assessment: 61 year old male with history of chronic A-fib on Eliquis PTA. Eliquis currently held in preparation for arteriogram planned on Thursday 6/01. Last dose of Eliquis 5/30 @ 1000. Pharmacy consulted to manage heparin while apixaban on hold.  Now S/p abdominal aortogram, and right posterior tibial artery angioplasty.  Heparin level therapeutic 0.35 (aPTT 68) on heparin 1550 units/hr. No issues with heparin infusion or signs of bleeding reported. CBC remains stable. Heparin level and aPTT now correlating. Will stop monitoring aPTT and use heparin level to dose heparin moving forward.    Goal of Therapy:  Heparin level 0.3-0.7 units/ml aPTT 66-102 seconds Monitor platelets by anticoagulation protocol: Yes   Plan:  Continue heparin infusion at 1550 units/hr Monitor heparin level daily  Continue to monitor H&H and platelets  Joseph Art, Pharm.D. PGY-1 Pharmacy Resident [id number] 09/04/2021 8:59 AM

## 2021-09-04 NOTE — Progress Notes (Signed)
Inpatient Rehab Admissions Coordinator:  Consult received. Await therapy evaluations and recommendations to help determine appropriateness of CIR. Will continue to follow.   Aira Sallade Graves Madden, MS, CCC-SLP Admissions Coordinator [phone]  

## 2021-09-04 NOTE — Evaluation (Signed)
Physical Therapy Evaluation Patient Details Name: David [name] MRN: [id number] DOB: 1960/12/10 Today's Date: 09/04/2021  History of Present Illness  61 y/o male presented to ED on 08/29/21 for fatigue, palpitations, lightheadedness, and diaphoresis. Found to be in Afib with RVR. Admitted for AKI, hyponatremia. 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 S/p abdominal aortogram with successful recanalization of occluded posterior tibial artery with balloon angioplast on 6/1. S/p TEE on 6/2 with no signifcant findings. 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 PMH: T2DM, Afib on Eliquis, CHF, HTN  Clinical Impression  Patient admitted with the above. PTA, patient independent and lives with wife. Patient currently presents with generalized weakness, impaired balance, and decreased activity tolerance. Educated patient on the importance of NWB and utilizing post op shoe for mobility, patient verbalized understanding. Patient able to ambulate in room with min guard and good adherence to NWB status. Patient wanting to trial use of knee scooter if available. Patient will benefit from skilled PT services during acute stay to address listed deficits. No PT follow up recommended at this time. May benefit from OPPT once able to bear weight if patient feels need to.        Recommendations for follow up therapy are one component of a multi-disciplinary discharge planning process, led by the attending physician.  Recommendations may be updated based on patient status, additional functional criteria and insurance authorization.  Follow Up Recommendations No PT follow up    Assistance Recommended at Discharge Intermittent Supervision/Assistance  Patient can return home with the following       Equipment Recommendations Rolling Manning Luna (2 wheels);BSC/3in1  Recommendations for Other Services       Functional Status Assessment Patient has had a recent decline in their functional status and demonstrates the ability to make  significant improvements in function in a reasonable and predictable amount of time.     Precautions / Restrictions Precautions Precautions: Fall Precaution Comments: post op shoe Restrictions Weight Bearing Restrictions: Yes RLE Weight Bearing: Non weight bearing Other Position/Activity Restrictions: in post op shoe      Mobility  Bed Mobility Overal bed mobility: Modified Independent                  Transfers Overall transfer level: Needs assistance Equipment used: Rolling Chrsitopher Wik (2 wheels) Transfers: Sit to/from Stand Sit to Stand: Min guard           General transfer comment: cues for hand placement. Min guard for safety. Good adherence to NWB    Ambulation/Gait Ambulation/Gait assistance: Min guard Gait Distance (Feet): 20 Feet Assistive device: Rolling Brendaliz Kuk (2 wheels) Gait Pattern/deviations:  (Hop to) Gait velocity: decreased     General Gait Details: min guard for safety. Good adherence to NWB on R with mobility  Stairs            Wheelchair Mobility    Modified Rankin (Stroke Patients Only)       Balance Overall balance assessment: Needs assistance Sitting-balance support: No upper extremity supported, Feet supported Sitting balance-Leahy Scale: Good     Standing balance support: Bilateral upper extremity supported, Reliant on assistive device for balance Standing balance-Leahy Scale: Poor Standing balance comment: reliant on B UE support                             Pertinent Vitals/Pain Pain Assessment Pain Assessment: No/denies pain    Home Living Family/patient expects to be discharged to:: Private  residence Living Arrangements: Spouse/significant other Available Help at Discharge: Family Type of Home: House Home Access: Level entry       [place]: [place]: None      Prior Function Prior Level of Function : Independent/Modified Independent;Working/employed;Driving                      Hand Dominance        Extremity/Trunk Assessment   Upper Extremity Assessment Upper Extremity Assessment: Overall WFL for tasks assessed    Lower Extremity Assessment Lower Extremity Assessment: RLE deficits/detail RLE Deficits / Details: difficult to assess R ankle due to immobilization but overall WFL    Cervical / Trunk Assessment Cervical / Trunk Assessment: Normal  Communication   Communication: No difficulties  Cognition Arousal/Alertness: Awake/alert Behavior During Therapy: WFL for tasks assessed/performed Overall Cognitive Status: Within Functional Limits for tasks assessed                                          General Comments General comments (skin integrity, edema, etc.): VSS on RA    Exercises     Assessment/Plan    PT Assessment Patient needs continued PT services  PT Problem List Decreased strength;Decreased activity tolerance;Decreased balance;Decreased mobility;Decreased knowledge of precautions       PT Treatment Interventions Gait training;DME instruction;Functional mobility training;Therapeutic activities;Therapeutic exercise;Balance training;Patient/family education    PT Goals (Current goals can be found in the Care Plan section)  Acute Rehab PT Goals Patient Stated Goal: to get better and go home PT Goal Formulation: With patient Time For Goal Achievement: 09/18/21 Potential to Achieve Goals: Good    Frequency Min 3X/week     Co-evaluation               AM-PAC PT "6 Clicks" Mobility  Outcome Measure Help needed turning from your back to your side while in a flat bed without using bedrails?: None Help needed moving from lying on your back to sitting on the side of a flat bed without using bedrails?: None Help needed moving to and from a bed to a chair (including a wheelchair)?: A Little Help needed standing up from a chair using your arms (e.g., wheelchair or bedside chair)?: A Little Help needed  to walk in hospital room?: A Little Help needed climbing 3-5 steps with a railing? : A Little 6 Click Score: 20    End of Session Equipment Utilized During Treatment: Gait belt Activity Tolerance: Patient tolerated treatment well Patient left: in bed;with call bell/phone within reach Nurse Communication: Mobility status PT Visit Diagnosis: Muscle weakness (generalized) (M62.81);Unsteadiness on feet (R26.81);Other abnormalities of gait and mobility (R26.89)    Time: [id number] PT Time Calculation (min) (ACUTE ONLY): 29 min   Charges:   PT Evaluation $PT Eval Moderate Complexity: 1 Mod PT Treatments $Therapeutic Activity: 8-22 mins        Travonna Swindle A. Gilford Rile PT, DPT Acute Rehabilitation Services Pager [phone] Office [phone]   Linna Hoff 09/04/2021, 5:03 PM

## 2021-09-04 NOTE — Anesthesia Procedure Notes (Signed)
Anesthesia Regional Block: Popliteal block   Pre-Anesthetic Checklist: , timeout performed,  Correct Patient, Correct Site, Correct Laterality,  Correct Procedure, Correct Position, site marked,  Risks and benefits discussed,  Pre-op evaluation,  At surgeon's request and post-op pain management  Laterality: Right  Prep: Maximum Sterile Barrier Precautions used, chloraprep       Needles:  Injection technique: Single-shot  Needle Type: Echogenic Stimulator Needle     Needle Length: 9cm  Needle Gauge: 21     Additional Needles:   Procedures:,,,, ultrasound used (permanent image in chart),,    Narrative:  Start time: 09/04/2021 7:20 AM End time: 09/04/2021 7:30 AM Injection made incrementally with aspirations every 5 mL.  Performed by: Personally  Anesthesiologist: Gaynelle Adu, MD

## 2021-09-05 ENCOUNTER — Inpatient Hospital Stay: Payer: Self-pay

## 2021-09-05 ENCOUNTER — Encounter (HOSPITAL_COMMUNITY): Payer: Self-pay | Admitting: Orthopedic Surgery

## 2021-09-05 ENCOUNTER — Inpatient Hospital Stay (HOSPITAL_COMMUNITY): Payer: Self-pay

## 2021-09-05 DIAGNOSIS — M86172 Other acute osteomyelitis, left ankle and foot: Secondary | ICD-10-CM

## 2021-09-05 LAB — GLUCOSE, CAPILLARY
Glucose-Capillary: 226 mg/dL — ABNORMAL HIGH (ref 70–99)
Glucose-Capillary: 266 mg/dL — ABNORMAL HIGH (ref 70–99)
Glucose-Capillary: 232 mg/dL — ABNORMAL HIGH (ref 70–99)
Glucose-Capillary: 347 mg/dL — ABNORMAL HIGH (ref 70–99)

## 2021-09-05 LAB — BASIC METABOLIC PANEL
Anion gap: 9 (ref 5–15)
BUN: 16 mg/dL (ref 6–20)
CO2: 21 mmol/L — ABNORMAL LOW (ref 22–32)
Calcium: 9 mg/dL (ref 8.9–10.3)
Chloride: 103 mmol/L (ref 98–111)
Creatinine, Ser: 0.96 mg/dL (ref 0.61–1.24)
GFR, Estimated: >60 mL/min (ref >60)
Glucose, Bld: 225 mg/dL — ABNORMAL HIGH (ref 70–99)
Potassium: 4.4 mmol/L (ref 3.5–5.1)
Sodium: 133 mmol/L — ABNORMAL LOW (ref 135–145)

## 2021-09-05 IMAGING — DX DG CHEST 1V PORT
1 series · 1 of 1 positions shown · non-contrast
Comparison: [DATE] CT.  [DATE] plain film.

CLINICAL DATA: PICC line placement

EXAM:
PORTABLE CHEST 1 VIEW

[chest]
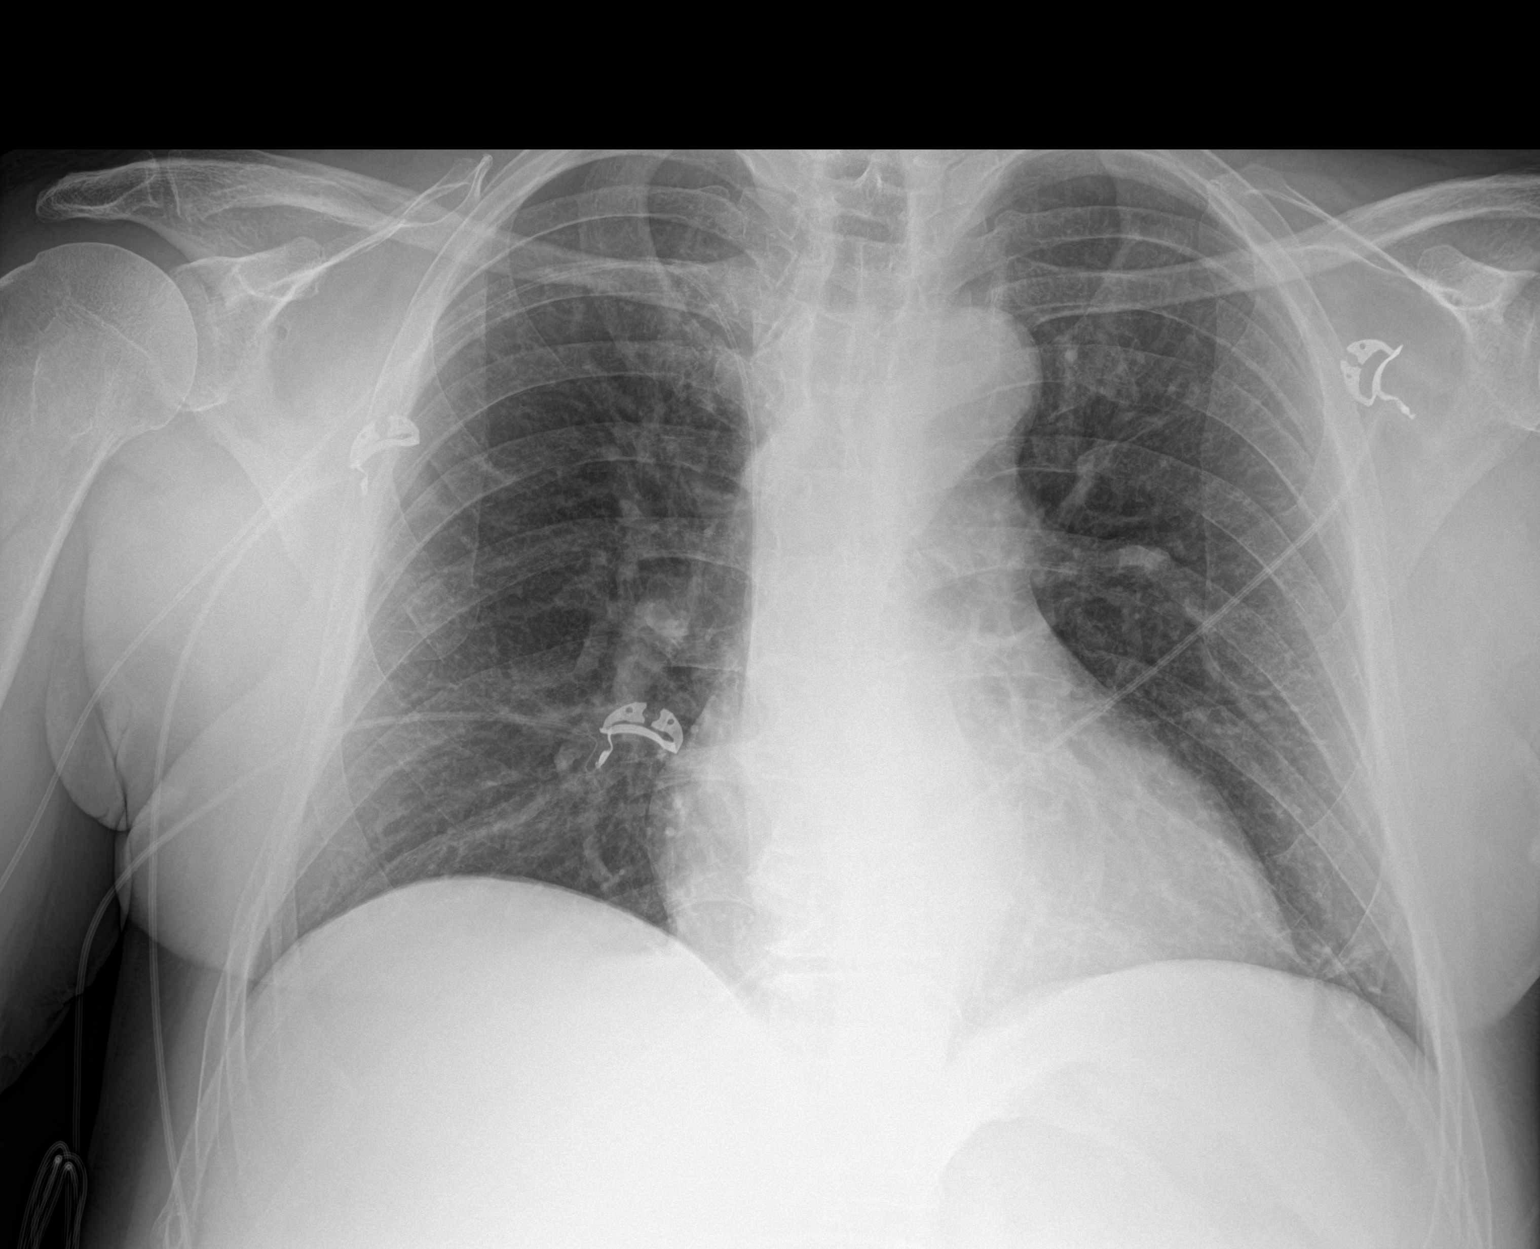

[1 of 1 positions shown; findings below may reference images not displayed]

FINDINGS: Right-sided PICC line is difficult to follow centrally. Followed to
at least the level of the superior caval/atrial junction. Multiple
leads and wires project over the chest. Midline trachea. Normal
heart size. No pleural effusion or pneumothorax. Clear lungs.
IMPRESSION: Right-sided PICC line is difficult to follow centrally. Consider
repeat with over penetration.

No acute cardiopulmonary disease.

## 2021-09-05 IMAGING — DX DG CHEST 1V PORT
1 series · 1 of 1 positions shown · non-contrast
Comparison: Radiograph earlier today.

CLINICAL DATA: PICC placement, follow-up exam due to poor
penetration.

EXAM:
PORTABLE CHEST 1 VIEW

[chest]
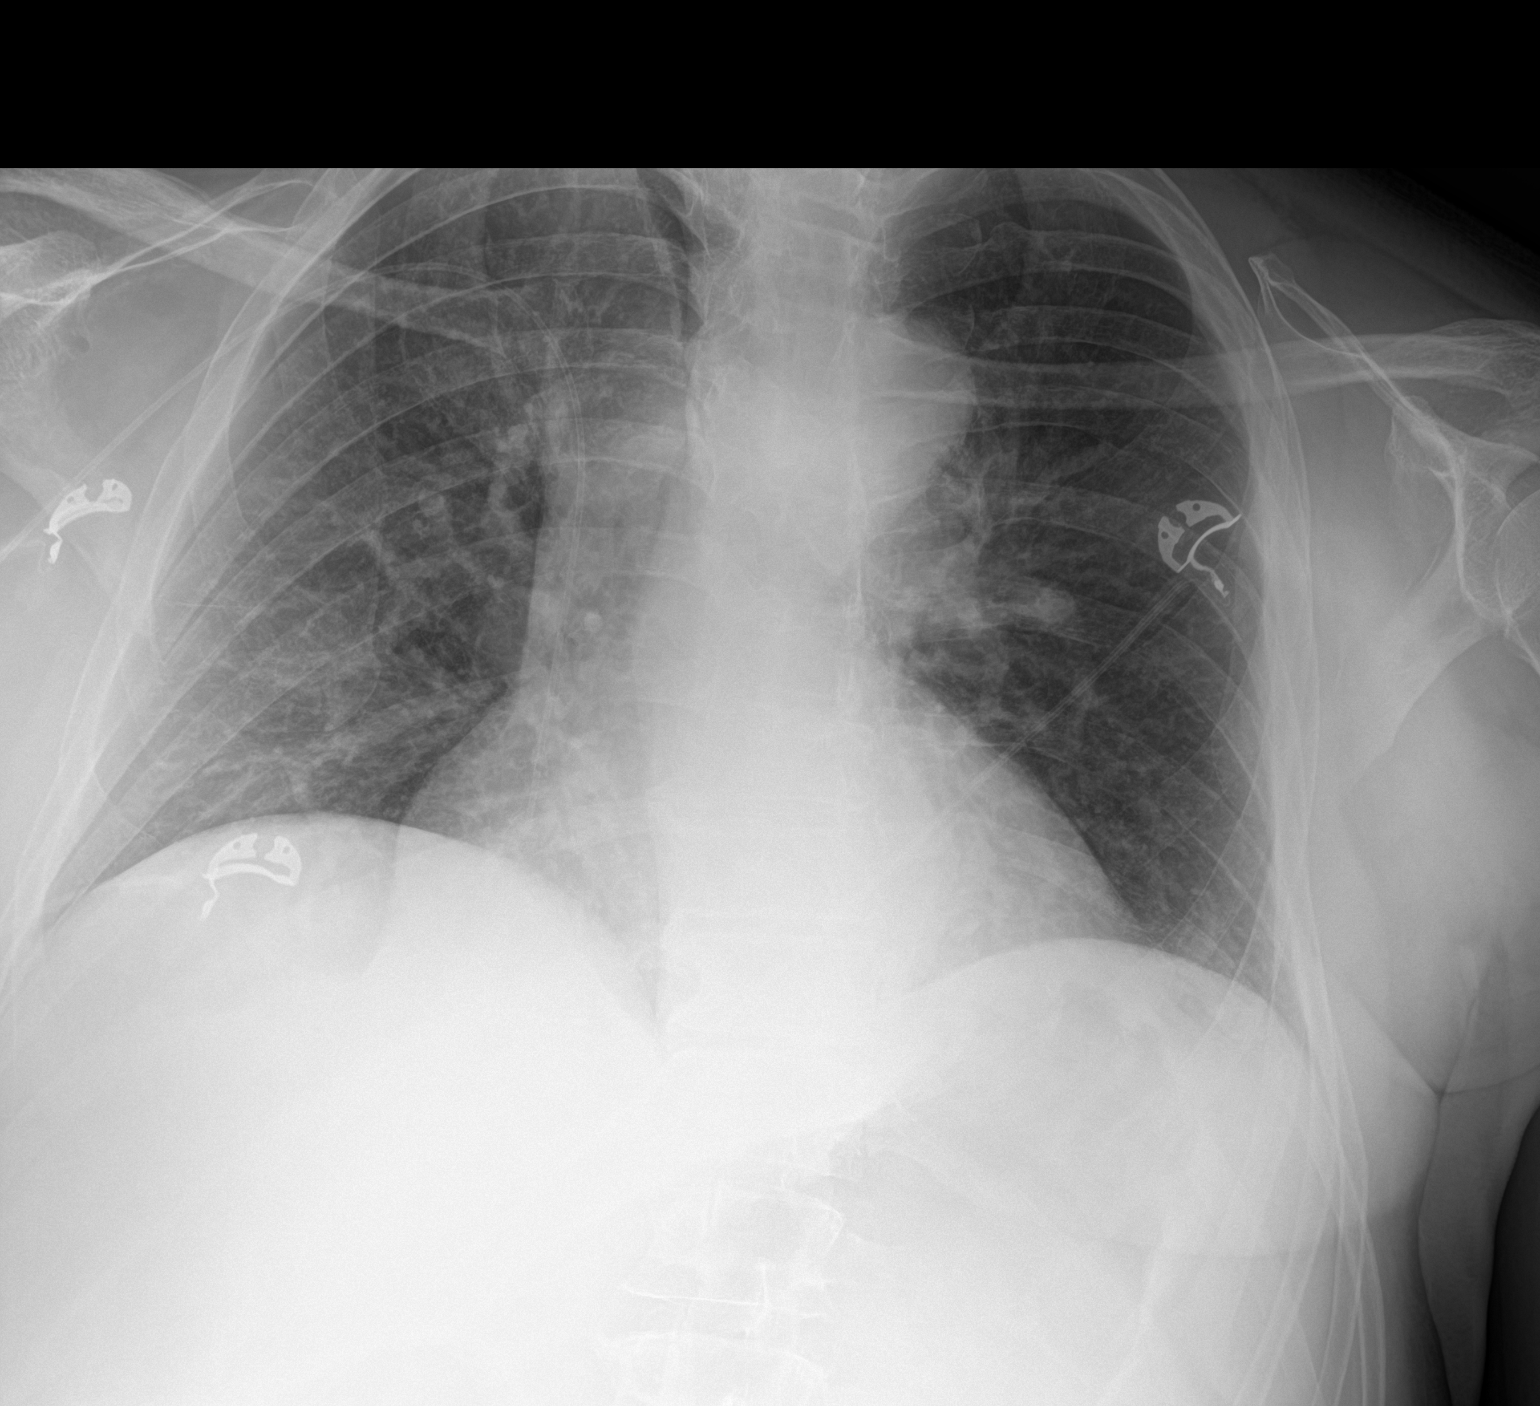

[1 of 1 positions shown; findings below may reference images not displayed]

FINDINGS: The right upper extremity PICC is well visualized, the tip is at the
atrial caval junction. Patient rotation helps with PICC
visualization lower lung volumes from prior exam. Prominent heart
size accentuated by low lung volumes.
IMPRESSION: 1. Right upper extremity PICC tip at the atrial caval junction.
2. Low lung volumes.

## 2021-09-05 MED ORDER — SODIUM CHLORIDE 0.9% FLUSH: 10.0000 mL | INTRAVENOUS | Status: DC | PRN

## 2021-09-05 MED ORDER — INSULIN GLARGINE-YFGN 100 UNIT/ML ~~LOC~~ SOLN
40.0000 [IU] | Freq: Every day | SUBCUTANEOUS | Status: DC
  Administered 2021-09-05: 40 [IU] via SUBCUTANEOUS
  Filled 2021-09-05 (×2): qty 0.4

## 2021-09-05 MED ORDER — INSULIN ASPART 100 UNIT/ML IJ SOLN
15.0000 [IU] | Freq: Three times a day (TID) | INTRAMUSCULAR | Status: DC
  Administered 2021-09-05 – 2021-09-06 (×3): 15 [IU] via SUBCUTANEOUS

## 2021-09-05 MED ORDER — CHLORHEXIDINE GLUCONATE CLOTH 2 % EX PADS
6.0000 | MEDICATED_PAD | Freq: Every day | CUTANEOUS | Status: DC
  Administered 2021-09-05 – 2021-09-06 (×2): 6 via TOPICAL

## 2021-09-05 MED ORDER — SODIUM CHLORIDE 0.9% FLUSH
10.0000 mL | Freq: Two times a day (BID) | INTRAVENOUS | Status: DC
  Administered 2021-09-05 – 2021-09-06 (×2): 10 mL

## 2021-09-05 NOTE — Progress Notes (Signed)
Physical Therapy Treatment Patient Details Name: David [name] MRN: [id number] DOB: December 15, 1960 Today's Date: 09/05/2021   History of Present Illness 61 y/o male presented to ED on 08/29/21 for fatigue, palpitations, lightheadedness, and diaphoresis. Found to be in Afib with RVR. Admitted for AKI, hyponatremia. 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 S/p abdominal aortogram with successful recanalization of occluded posterior tibial artery with balloon angioplast on 6/1. S/p TEE on 6/2 with no signifcant findings. 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 PMH: T2DM, Afib on Eliquis, CHF, HTN    PT Comments    Brought knee scooter for pt to try out today and he did very well on it. Was safe with transitions as well as turning and backing. Would allow him to mobilize in the community at normal pace and he reports he could possibly return to work with it. Would greatly benefit from this equipment for d/c. PT will continue to follow.    Recommendations for follow up therapy are one component of a multi-disciplinary discharge planning process, led by the attending physician.  Recommendations may be updated based on patient status, additional functional criteria and insurance authorization.  Follow Up Recommendations  No PT follow up     Assistance Recommended at Discharge Intermittent Supervision/Assistance  Patient can return home with the following Assist for transportation   Equipment Recommendations  Rolling walker (2 wheels);BSC/3in1;Other (comment) (knee scooter)    Recommendations for Other Services       Precautions / Restrictions Precautions Precautions: Fall Precaution Comments: post op shoe Restrictions Weight Bearing Restrictions: Yes RLE Weight Bearing: Non weight bearing Other Position/Activity Restrictions: in post op shoe     Mobility  Bed Mobility Overal bed mobility: Modified Independent                  Transfers Overall transfer level: Needs  assistance Equipment used:  (knee scooter) Transfers: Sit to/from Stand Sit to Stand: Supervision           General transfer comment: supervision to stand with hand on RW to get to knee scooter in front of him. Pt did well with this    Ambulation/Gait Ambulation/Gait assistance: Supervision Gait Distance (Feet): 100 Feet Assistive device:  (knee scooter) Gait Pattern/deviations: Step-to pattern Gait velocity: WFL Gait velocity interpretation: >4.37 ft/sec, indicative of normal walking speed   General Gait Details: pt safe with knee scooter, worked on turns and backing.   Stairs             Wheelchair Mobility    Modified Rankin (Stroke Patients Only)       Balance Overall balance assessment: Needs assistance Sitting-balance support: No upper extremity supported, Feet supported Sitting balance-Leahy Scale: Good     Standing balance support: Bilateral upper extremity supported, Reliant on assistive device for balance Standing balance-Leahy Scale: Poor Standing balance comment: safe on knee scooter                            Cognition Arousal/Alertness: Awake/alert Behavior During Therapy: WFL for tasks assessed/performed Overall Cognitive Status: Within Functional Limits for tasks assessed                                          Exercises      General Comments General comments (skin integrity, edema, etc.): VSS on RA.  Pertinent Vitals/Pain Pain Assessment Pain Assessment: 0-10 Pain Score: 6  Pain Location: R foot Pain Descriptors / Indicators: Aching Pain Intervention(s): Limited activity within patient's tolerance, Monitored during session, Premedicated before session    Home Living                          Prior Function            PT Goals (current goals can now be found in the care plan section) Acute Rehab PT Goals Patient Stated Goal: to get better and go home PT Goal Formulation: With  patient Time For Goal Achievement: 09/18/21 Potential to Achieve Goals: Good Progress towards PT goals: Progressing toward goals    Frequency    Min 3X/week      PT Plan Current plan remains appropriate    Co-evaluation              AM-PAC PT "6 Clicks" Mobility   Outcome Measure  Help needed turning from your back to your side while in a flat bed without using bedrails?: None Help needed moving from lying on your back to sitting on the side of a flat bed without using bedrails?: None Help needed moving to and from a bed to a chair (including a wheelchair)?: A Little Help needed standing up from a chair using your arms (e.g., wheelchair or bedside chair)?: A Little Help needed to walk in hospital room?: A Little Help needed climbing 3-5 steps with a railing? : A Little 6 Click Score: 20    End of Session Equipment Utilized During Treatment: Gait belt Activity Tolerance: Patient tolerated treatment well Patient left: in bed;with call bell/phone within reach Nurse Communication: Mobility status PT Visit Diagnosis: Muscle weakness (generalized) (M62.81);Unsteadiness on feet (R26.81);Other abnormalities of gait and mobility (R26.89)     Time: 1545-1601 PT Time Calculation (min) (ACUTE ONLY): 16 min  Charges:  $Gait Training: 8-22 mins                     Leighton Roach, [place]  Pager [phone] Office [place] 09/05/2021, 4:48 PM

## 2021-09-05 NOTE — Progress Notes (Signed)
Patient with Gilford Rile delivered to room, [employer] on chart. Marsha Hillman, Bettina Gavia rN
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
## 2021-09-05 NOTE — Progress Notes (Signed)
Inpatient Rehab Admissions Coordinator:  Note PT is not recommending PT follow up. It appears that pt will not require the intensity of CIR. AC will sign off.   Wolfgang Phoenix, MS, CCC-SLP Admissions Coordinator [phone]

## 2021-09-05 NOTE — TOC Transition Note (Addendum)
Transition of Care [facility]) - CM/SW Discharge Note   Patient Details  Name: David [name] MRN: [id number] Date of Birth: 16-Oct-1960  Transition of Care [facility]) CM/SW Contact:  Lockie Pares, RN Phone Number: 09/05/2021, 11:18 AM   Clinical Narrative:    Spoke to paitent via phone. He has no other DME needs . He does need transportation home. His wife doesn't drive, but is working all day. Patient stated his sister could come stay with him after DC.  Will provide taxi voucher to patient for DC home. Address verified in chart 1400  message from nursing stating that ID has decided to place patient on home IV antibiotics. Spoke to [employer] from [place],  She will not be able to get ABX set up until tomorrow.  [facility] is charity HH ts week, Art gallery manager for [employer].They have accepted it, but sending to branch for clarification. Patient is currently staying in an extended stay hotel. Patient will still need PICC line placed.   Final next level of care: Home/Self Care Barriers to Discharge: No Barriers Identified (Providing transportation via Taxi to get home)   Patient Goals and CMS Choice Patient states their goals for this hospitalization and ongoing recovery are:: Go home      Discharge Placement               Home ( hotel) with RN [facility] IV antibiotics and Health visitor and Services   Discharge Planning Services: Follow-up appt scheduled            DME Arranged: [place] rolling DME Agency: [employer] Date DME Agency Contacted: 09/05/21 Time DME Agency Contacted: 1118 Representative spoke with at DME Agency: Leavy Cella            Social Determinants of Health (SDOH) Interventions     Readmission Risk Interventions    08/29/2019    9:50 AM  Readmission Risk Prevention Plan  Medication Screening Complete  Transportation Screening Complete

## 2021-09-05 NOTE — Progress Notes (Signed)
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 Vascular surgery plan on angiogram with possible intervention.   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 Underwent recanalization of occluded post tibial artery and balloon angioplasty on 6/1. 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 Jeanett Schlein, Orthopedics. No Cx.    Recommendations: -Anticipate 6 weeks of broad spectrum IV antibiotics from OR -Place PICC -Switch vancomycin to daptomycin -Continue ceftriaxone and metronidazole -Follow-up with ID (myself)  OPAT ORDERS:  Diagnosis: OM Rt foot/MRSA bacteremia  Culture Result: Blood Cx+ MRSA  No Known Allergies   Discharge antibiotics to be given via PICC line:  Per pharmacy protocol Daptomycin 8mg /kg /day, ceftriaxone 2gm/day Pt is also on ORAL metronidazole 500mg  PO bid    Duration: 6 weeks End Date: 10/15/21  [facility] Care Per Protocol with Biop[facility] Use: Home health RN for IV administration and teaching, line care and labs.    Labs weekly while on IV antibiotics: __ CBC with differential __  CMP __ CRP __ ESR __ CK  __ Please pull PIC at completion of IV antibiotics   Fax weekly labs to [phone]  Clinic Follow Up Appt: 6/20  @ [facility] with Dr. Candiss Norse  Microbiology:   Antibiotics: Vancomycin 5/28-p Cefepime 5/28-5/29 CTX 5/30-p Metronidazole 5/28- Cultures: Blood 5/28 1/1 MRSA 5/29 3/3 NG Urine 5/28 insignificant growth    SUBJECTIVE: Resting in bed. States he lives in an apartment with his wife. Denies hx of drug use.  Interval: Afebrile overnight, wbc 10.4k  Review of Systems: Review of Systems  All other systems reviewed and are negative.   Scheduled Meds:  apixaban  5 mg Oral BID   vitamin C  1,000 mg Oral Daily   aspirin EC  81 mg Oral Daily   atorvastatin  80 mg Oral Daily   carvedilol  3.125 mg Oral BID WC   clopidogrel  75 mg Oral Q breakfast   digoxin  125 mcg Oral Daily   docusate sodium  100 mg Oral Daily   feeding supplement (GLUCERNA SHAKE)  237 mL Oral BID BM   furosemide  40 mg Oral Daily   insulin aspart  0-15 Units Subcutaneous TID WC   insulin aspart  0-5 Units Subcutaneous QHS   insulin aspart  13 Units Subcutaneous TID WC   insulin glargine-yfgn  33 Units Subcutaneous QHS   loratadine  10 mg Oral Daily   metroNIDAZOLE  500 mg Oral Q12H   mupirocin ointment  1 application. Nasal BID   nutrition supplement (JUVEN)  1 packet Oral BID BM   pantoprazole  40 mg Oral Daily   sacubitril-valsartan  1 tablet Oral BID   sodium chloride flush  3 mL Intravenous Q12H   spironolactone  25 mg Oral Daily   zinc sulfate  220 mg Oral Daily   Continuous Infusions:  sodium chloride Stopped (09/03/21 0518)   sodium chloride     cefTRIAXone (ROCEPHIN)  IV Stopped (09/04/21 1539)   magnesium sulfate bolus IVPB     vancomycin 1,500 mg (09/05/21 0222)   PRN Meds:.sodium chloride, acetaminophen **OR** acetaminophen, acetaminophen, acetaminophen, acetaminophen, albuterol, alum & mag hydroxide-simeth, bisacodyl, guaiFENesin-dextromethorphan,  hydrALAZINE, hydrALAZINE, HYDROcodone-acetaminophen, HYDROcodone-acetaminophen, HYDROcodone-acetaminophen, labetalol, labetalol, magnesium citrate, magnesium sulfate bolus IVPB, metoprolol tartrate, morphine injection, ondansetron, phenol, polyethylene glycol, potassium chloride, sodium chloride, sodium chloride flush No Known Allergies  OBJECTIVE: Vitals:   09/04/21 2055 09/04/21 2343 09/05/21 0545 09/05/21 0805  BP: 110/88 101/77 95/65 100/77  Pulse: 100 94 94 95  Resp: $Remo'20 20 12 15  'tSeZN$ Temp: 99 F (37.2 C) 98 F (36.7 C) 98.5 F (36.9 C) 97.8 F (36.6 C)  TempSrc: Oral Oral Axillary Oral  SpO2: 96% 96% 94% 96%  Weight:   85.3 kg   Height:       Body mass index is 25.5 kg/m.  Physical Exam Constitutional:      General: He is not in acute distress.    Appearance: He is normal weight. He is not toxic-appearing.  HENT:     Head: Normocephalic and atraumatic.     Right Ear: External ear normal.     Left Ear: External ear normal.     Nose: No congestion or rhinorrhea.     Mouth/Throat:     Mouth: Mucous membranes are moist.     Pharynx: Oropharynx is clear.  Eyes:     Extraocular Movements: Extraocular movements intact.     Conjunctiva/sclera: Conjunctivae normal.     Pupils: Pupils are equal, round, and reactive to light.  Cardiovascular:     Rate and Rhythm: Normal rate and regular rhythm.     Heart sounds: No murmur heard.   No friction rub. No gallop.  Pulmonary:     Effort: Pulmonary effort is normal.     Breath sounds: Normal breath sounds.  Abdominal:     General: Abdomen is flat. Bowel sounds are normal.     Palpations: Abdomen is soft.  Musculoskeletal:        General: No swelling.     Cervical back: Normal range of motion and neck supple.  Skin:    General: Skin is warm and dry.  Neurological:     General: No focal deficit present.     Mental Status: He is oriented to person, place, and time.  Psychiatric:        Mood and Affect: Mood normal.      Lab  Results Lab Results  Component Value Date   WBC 10.4 09/04/2021   HGB 14.0 09/04/2021   HCT 40.2 09/04/2021   MCV 91.6 09/04/2021   PLT 240 09/04/2021    Lab Results  Component Value Date   CREATININE 0.96 09/05/2021   BUN 16 09/05/2021   NA 133 (L) 09/05/2021   K 4.4 09/05/2021   CL 103 09/05/2021   CO2 21 (L) 09/05/2021    Lab Results  Component Value Date   ALT 17 08/30/2021   AST 21 08/30/2021   ALKPHOS 87 08/30/2021   BILITOT 1.2 08/30/2021        Laurice Record, [place] [facility]  [facility] 09/05/2021, 8:18 AM

## 2021-09-05 NOTE — Progress Notes (Signed)
Peripherally Inserted Central Catheter Placement  The IV Nurse has discussed with the patient and/or persons authorized to consent for the patient, the purpose of this procedure and the potential benefits and risks involved with this procedure.  The benefits include less needle sticks, lab draws from the catheter, and the patient may be discharged home with the catheter. Risks include, but not limited to, infection, bleeding, blood clot (thrombus formation), and puncture of an artery; nerve damage and irregular heartbeat and possibility to perform a PICC exchange if needed/ordered by physician.  Alternatives to this procedure were also discussed.  Bard Power PICC patient education guide, fact sheet on infection prevention and patient information card has been provided to patient /or left at bedside.    PICC Placement Documentation  PICC Single Lumen 09/05/21 Right Brachial 44 cm 0 cm (Active)  Indication for Insertion or Continuance of Line Home intravenous therapies (PICC only) 09/05/21 1737  Exposed Catheter (cm) 0 cm 09/05/21 1737  Site Assessment Clean, Dry, Intact 09/05/21 1737  Line Status Flushed;Saline locked;Blood return noted 09/05/21 1737  Dressing Type Transparent;Securing device 09/05/21 1737  Dressing Status Antimicrobial disc in place;Clean, Dry, Intact 09/05/21 1737  Safety Lock Not Applicable [id number]  Line Care Connections checked and tightened 09/05/21 1737  Line Adjustment (NICU/IV Team Only) No 09/05/21 1737  Dressing Intervention New dressing 09/05/21 1737  Dressing Change Due 09/12/21 09/05/21 1737       Rolena Infante 09/05/2021, 5:37 PM

## 2021-09-06 ENCOUNTER — Other Ambulatory Visit (HOSPITAL_COMMUNITY): Payer: Self-pay

## 2021-09-06 ENCOUNTER — Encounter (HOSPITAL_COMMUNITY): Payer: Self-pay | Admitting: Cardiovascular Disease

## 2021-09-06 LAB — CK: Total CK: 36 U/L — ABNORMAL LOW (ref 49–397)

## 2021-09-06 LAB — GLUCOSE, CAPILLARY
Glucose-Capillary: 209 mg/dL — ABNORMAL HIGH (ref 70–99)
Glucose-Capillary: 193 mg/dL — ABNORMAL HIGH (ref 70–99)

## 2021-09-06 MED ORDER — CEFTRIAXONE IV (FOR PTA / DISCHARGE USE ONLY): 2.0000 g | INTRAVENOUS | 0 refills | Status: DC

## 2021-09-06 MED ORDER — DAPTOMYCIN IV (FOR PTA / DISCHARGE USE ONLY): 700.0000 mg | INTRAVENOUS | 0 refills | Status: DC

## 2021-09-06 MED ORDER — CARVEDILOL 3.125 MG PO TABS
3.1250 mg | ORAL_TABLET | Freq: Two times a day (BID) | ORAL | 1 refills | Status: DC
  Filled 2021-09-06 – 2021-10-04 (×2): qty 60, 30d supply, fill #0

## 2021-09-06 MED ORDER — INSULIN ASPART 100 UNIT/ML FLEXPEN
15.0000 [IU] | PEN_INJECTOR | Freq: Three times a day (TID) | SUBCUTANEOUS | 0 refills | Status: DC
  Filled 2021-09-06: qty 12, 27d supply, fill #0
  Filled 2021-09-26: qty 3, 7d supply, fill #0

## 2021-09-06 MED ORDER — FUROSEMIDE 40 MG PO TABS
40.0000 mg | ORAL_TABLET | Freq: Every day | ORAL | 1 refills | Status: DC
  Filled 2021-09-06 – 2021-10-04 (×2): qty 30, 30d supply, fill #0

## 2021-09-06 MED ORDER — SODIUM CHLORIDE 0.9 % IV SOLN
8.0000 mg/kg | Freq: Every day | INTRAVENOUS | Status: DC
  Filled 2021-09-06: qty 14

## 2021-09-06 MED ORDER — SODIUM CHLORIDE 0.9 % IV SOLN
8.0000 mg/kg | Freq: Every day | INTRAVENOUS | Status: DC
  Administered 2021-09-06: 700 mg via INTRAVENOUS
  Filled 2021-09-06: qty 14

## 2021-09-06 MED ORDER — ATORVASTATIN CALCIUM 80 MG PO TABS
80.0000 mg | ORAL_TABLET | Freq: Every day | ORAL | 1 refills | Status: DC
  Filled 2021-09-06 – 2021-10-04 (×2): qty 30, 30d supply, fill #0

## 2021-09-06 MED ORDER — HYDROCODONE-ACETAMINOPHEN 5-325 MG PO TABS
1.0000 | ORAL_TABLET | Freq: Four times a day (QID) | ORAL | 0 refills | Status: DC | PRN
  Filled 2021-09-06: qty 15, 2d supply, fill #0

## 2021-09-06 MED ORDER — ASPIRIN 81 MG PO TBEC
81.0000 mg | DELAYED_RELEASE_TABLET | Freq: Every day | ORAL | 1 refills | Status: DC
  Filled 2021-09-06 – 2022-02-15 (×5): qty 30, 30d supply, fill #0

## 2021-09-06 MED ORDER — METRONIDAZOLE 500 MG PO TABS
500.0000 mg | ORAL_TABLET | Freq: Two times a day (BID) | ORAL | 0 refills | Status: DC
  Filled 2021-09-06: qty 60, 30d supply, fill #0
  Filled 2021-10-18: qty 18, 9d supply, fill #0

## 2021-09-06 MED ORDER — CLOPIDOGREL BISULFATE 75 MG PO TABS
75.0000 mg | ORAL_TABLET | Freq: Every day | ORAL | 1 refills | Status: DC
  Filled 2021-09-06 – 2021-10-04 (×2): qty 30, 30d supply, fill #0

## 2021-09-06 MED ORDER — BASAGLAR KWIKPEN 100 UNIT/ML ~~LOC~~ SOPN
40.0000 [IU] | PEN_INJECTOR | Freq: Every day | SUBCUTANEOUS | 1 refills | Status: DC
  Filled 2021-09-06 – 2021-10-04 (×2): qty 12, 30d supply, fill #0

## 2021-09-06 NOTE — Progress Notes (Addendum)
Medication Update: Warning noted:  Warnings Override History for DAPTOmycin (CUBICIN) 700 mg in sodium chloride 0.9 % IVPB [[id number]]  Overridden by Ann Held, [facility] on Sep 06, 2021 11:33 AM  Drug-Drug  1. DAPTOMYCIN / HMG-COA REDUCTASE INHIBITORS (STATINS) [Level: Major] [Reason: Clinician Reviewed]  Other Orders: atorvastatin (LIPITOR) tablet 80 mg     Called pharmacist and advised to have the patient follow-up outpatient with PCP.  She did have a suggestion of changing med to Crestor or an alternative med but referred back to it being an MD decision.  Notified MD awaiting orders.  No changes noted.  Advised to continue   Pt educated on drug interaction and stated he knows people that he can talk to about the medications

## 2021-09-06 NOTE — Progress Notes (Signed)
Discharge:  Medications reviewed with hard copy prescriptions given for ABT therapy.   Discharge summary reviewed.  Discharge with RUA PICC for home ABT therapy.  CCMD notified.   [place] delivered.   Assisted by staff via w/c to exit for taxi home

## 2021-09-06 NOTE — Progress Notes (Signed)
PHARMACY CONSULT NOTE FOR:  OUTPATIENT  PARENTERAL ANTIBIOTIC THERAPY (OPAT)  Indication: R-foot osteo Regimen: Daptomycin 700 mg IV every 24 hours + Ceftriaxone 2g IV every 24 hours + Metronidazole 500 mg po every 12 hours End date: 10/15/21  IV antibiotic discharge orders are pended. To discharging provider:  please sign these orders via discharge navigator,  Select New Orders & click on the button choice - Manage This Unsigned Work.     Thank you for allowing pharmacy to be a part of this patient's care.  Georgina Pillion, PharmD, BCPS Infectious Diseases Clinical Pharmacist 09/06/2021 11:27 AM   **Pharmacist phone directory can now be found on [email or web address] ([email or web address]).  Listed under [facility].

## 2021-09-06 NOTE — Progress Notes (Signed)
Patient ID: David [name], male   DOB: [birth year], 61 y.o.   MRN: [id number] Patient is postoperative day 3 right foot fourth ray amputation.  The incision is clean and dry.  Plan for discharge nonweightbearing on the right follow-up in the office in 1 week.  He may wash the foot with soap and water change dressing daily.

## 2021-09-06 NOTE — Discharge Summary (Addendum)
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 38 mm. FINDINGS  Left Ventricle: Left ventricular ejection fraction, by estimation, is 55 to 60%. The left ventricle has normal function. The left ventricle has no regional wall motion abnormalities. The left ventricular internal cavity size was normal in size. There is  no left ventricular hypertrophy. Left ventricular diastolic parameters are indeterminate. Right Ventricle: The right ventricular size is normal. Right vetricular wall thickness was not assessed. Right ventricular systolic function is low normal. Left Atrium: Left atrial size was mild to moderately dilated. Right Atrium: Right atrial size was normal in size. Pericardium: There is no evidence of pericardial effusion. Mitral Valve: The mitral valve is normal in structure. Mild mitral valve regurgitation. Tricuspid Valve: The  tricuspid valve is normal in structure. Tricuspid valve regurgitation is trivial. Aortic Valve: The aortic valve is tricuspid. Aortic valve regurgitation is not visualized. Aortic valve sclerosis/calcification is present, without any evidence of aortic stenosis. Aortic valve mean gradient measures 4.0 mmHg. Aortic valve peak gradient measures 7.6 mmHg. Aortic valve area, by VTI measures 2.45 cm. Pulmonic Valve: The pulmonic valve was grossly normal. Pulmonic valve regurgitation is not visualized. Aorta: Aortic dilatation noted. There is mild dilatation of the aortic root, measuring 40 mm. There is mild dilatation of the ascending aorta, measuring 38 mm. IAS/Shunts: No atrial level shunt detected by color flow Doppler.  LEFT VENTRICLE PLAX 2D LVIDd:         4.10 cm     Diastology LVIDs:         2.90 cm     LV e' medial:    8.92 cm/s LV PW:         1.00 cm     LV E/e' medial:  11.8 LV IVS:        0.90 cm     LV e' lateral:   13.70 cm/s LVOT diam:     2.00 cm     LV E/e' lateral: 7.7 LV SV:         59 LV SV Index:   29 LVOT Area:     3.14 cm  LV Volumes (MOD) LV vol d, MOD A2C: 54.8 ml LV vol d, MOD A4C: 81.0 ml LV vol s, MOD A2C: 30.5 ml LV vol s, MOD A4C: 36.1 ml LV SV MOD A2C:     24.3 ml LV SV MOD A4C:     81.0 ml LV SV MOD BP:      33.8 ml RIGHT VENTRICLE             IVC RV Basal diam:  3.80 cm     IVC diam: 1.90 cm RV Mid diam:    2.60 cm RV S prime:     16.90 cm/s TAPSE (M-mode): 1.7 cm LEFT ATRIUM             Index        RIGHT ATRIUM           Index LA diam:        4.50 cm 2.22 cm/m   RA Area:     16.00 cm LA Vol (A2C):   70.4 ml 34.71 ml/m  RA Volume:   40.80 ml  20.12 ml/m LA Vol (A4C):   87.0 ml 42.89 ml/m LA Biplane Vol: 79.8  ml 39.34 ml/m  AORTIC VALVE                     PULMONIC VALVE AV Area (Vmax):    2.15 cm      PV Vmax:       0.90 m/s AV Area (Vmean):   2.21 cm      PV Peak grad:  3.2 mmHg AV Area (VTI):     2.45 cm AV Vmax:           138.00 cm/s AV Vmean:          101.000 cm/s AV VTI:             0.242 m AV Peak Grad:      7.6 mmHg AV Mean Grad:      4.0 mmHg LVOT Vmax:         94.60 cm/s LVOT Vmean:        71.200 cm/s LVOT VTI:          0.189 m LVOT/AV VTI ratio: 0.78  AORTA Ao Root diam: 4.00 cm Ao Asc diam:  3.80 cm MITRAL VALVE                TRICUSPID VALVE MV Area (PHT): 5.97 cm     TR Peak grad:   23.4 mmHg MV Decel Time: 127 msec     TR Vmax:        242.00 cm/s MV E velocity: 105.00 cm/s                             SHUNTS                             Systemic VTI:  0.19 m                             Systemic Diam: 2.00 cm Dorris Carnes MD Electronically signed by Dorris Carnes MD Signature Date/Time: 08/31/2021/1:36:22 PM    Final    ECHO TEE  Result Date: 09/03/2021    TRANSESOPHOGEAL ECHO REPORT   Patient Name:   [name] Date of Exam: 09/03/2021 Medical Rec #:  [id number]   Height:       72.0 in Accession #:    [id number]  Weight:       176.6 lb Date of Birth:  07-25-1960    BSA:          2.021 m Patient Age:    59 years    BP:           107/74 mmHg Patient Gender: M           HR:           85 bpm. Exam Location:  Inpatient Procedure: Transesophageal Echo, Color Doppler and Cardiac Doppler Indications:     Bacteremia  History:         Patient has prior history of Echocardiogram examinations, most                  recent 08/31/2021. CHF and Cardiomyopathy, Arrythmias:Atrial                  Fibrillation; Risk Factors:Hypertension, Current Smoker and                  Sleep Apnea.  Sonographer:  Eartha Inch Referring Phys:  [id number] [facility] Diagnosing Phys: Skeet Latch MD PROCEDURE: After discussion of the risks and benefits of a TEE, an informed consent was obtained from the patient. The transesophogeal probe was passed without difficulty through the esophogus of the patient. Sedation performed by different physician. The patient was monitored while under deep sedation. Anesthestetic sedation was provided intravenously by Anesthesiology: 178.14mg  of Propofol. Image quality was  adequate. The patient's vital signs; including heart rate, blood pressure, and oxygen saturation; remained stable throughout the procedure. The patient developed no complications during the procedure. IMPRESSIONS  1. Left ventricular ejection fraction, by estimation, is 60 to 65%. The left ventricle has normal function. The left ventricle has no regional wall motion abnormalities.  2. Right ventricular systolic function is normal. The right ventricular size is normal.  3. No left atrial/left atrial appendage thrombus was detected.  4. The mitral valve is normal in structure. Trivial mitral valve regurgitation. No evidence of mitral stenosis.  5. The aortic valve is tricuspid. Aortic valve regurgitation is not visualized. No aortic stenosis is present.  6. The inferior vena cava is normal in size with greater than 50% respiratory variability, suggesting right atrial pressure of 3 mmHg. Conclusion(s)/Recommendation(s): Normal biventricular function without evidence of hemodynamically significant valvular heart disease. FINDINGS  Left Ventricle: Left ventricular ejection fraction, by estimation, is 60 to 65%. The left ventricle has normal function. The left ventricle has no regional wall motion abnormalities. The left ventricular internal cavity size was normal in size. There is  no left ventricular hypertrophy. Right Ventricle: The right ventricular size is normal. No increase in right ventricular wall thickness. Right ventricular systolic function is normal. Left Atrium: Left atrial size was normal in size. No left atrial/left atrial appendage thrombus was detected. Right Atrium: Right atrial size was normal in size. Pericardium: There is no evidence of pericardial effusion. Mitral Valve: The mitral valve is normal in structure. Trivial mitral valve regurgitation. No evidence of mitral valve stenosis. Tricuspid Valve: The tricuspid valve is normal in structure. Tricuspid valve regurgitation is trivial. No evidence of  tricuspid stenosis. Aortic Valve: The aortic valve is tricuspid. Aortic valve regurgitation is not visualized. No aortic stenosis is present. Pulmonic Valve: The pulmonic valve was normal in structure. Pulmonic valve regurgitation is not visualized. No evidence of pulmonic stenosis. Aorta: The aortic root is normal in size and structure. Venous: The inferior vena cava is normal in size with greater than 50% respiratory variability, suggesting right atrial pressure of 3 mmHg. IAS/Shunts: No atrial level shunt detected by color flow Doppler. Skeet Latch MD Electronically signed by Skeet Latch MD Signature Date/Time: 09/03/2021/1:38:14 PM    Final    [place] EKG SITE RITE  Result Date: 09/05/2021 If Site Rite image not attached, placement could not be confirmed due to current cardiac rhythm.  Inpatient: VAS [place] PAD ABI  Result Date: 08/31/2021  LOWER EXTREMITY DOPPLER STUDY Patient Name:  CULLIN DISHMAN  Date of Exam:   08/30/2021 Medical Rec #: [id number]    Accession #:    [id number] Date of Birth: 05/25/1960     Patient Gender: M Patient Age:   65 years Exam Location:  [facility] Procedure:      VAS [place] ABI WITH/WO TBI Referring Phys: Nyoka Lint DOUTOVA --------------------------------------------------------------------------------  Indications: Ulceration. High Risk Factors: Hypertension, Diabetes.  Comparison Study: No prior study Performing Technologist: Maudry Mayhew MHA, RVT, RDCS, RDMS  Examination Guidelines: A complete evaluation includes at minimum, Doppler waveform signals and systolic  blood pressure reading at the level of bilateral brachial, anterior tibial, and posterior tibial arteries, when vessel segments are accessible. Bilateral testing is considered an integral part of a complete examination. Photoelectric Plethysmograph (PPG) waveforms and toe systolic pressure readings are included as required and additional duplex testing as needed. Limited examinations for reoccurring  indications may be performed as noted.  ABI Findings: +---------+------------------+-----+----------+--------+ Right    Rt Pressure (mmHg)IndexWaveform  Comment  +---------+------------------+-----+----------+--------+ Brachial 168                    triphasic          +---------+------------------+-----+----------+--------+ PTA      137               0.82 monophasic         +---------+------------------+-----+----------+--------+ DP       136               0.81 monophasic         +---------+------------------+-----+----------+--------+ Great Toe                       Absent             +---------+------------------+-----+----------+--------+ +---------+------------------+-----+----------+-------+ Left     Lt Pressure (mmHg)IndexWaveform  Comment +---------+------------------+-----+----------+-------+ Brachial 158                    triphasic         +---------+------------------+-----+----------+-------+ PTA      127               0.76 monophasic        +---------+------------------+-----+----------+-------+ DP       113               0.67 monophasic        +---------+------------------+-----+----------+-------+ Great Toe84                0.50                   +---------+------------------+-----+----------+-------+ +-------+-----------+-----------+------------+------------+ ABI/TBIToday's ABIToday's TBIPrevious ABIPrevious TBI +-------+-----------+-----------+------------+------------+ Right  0.82       0.00                                +-------+-----------+-----------+------------+------------+ Left   0.76       0.50                                +-------+-----------+-----------+------------+------------+  Summary: Right: Resting right ankle-brachial index indicates mild right lower extremity arterial disease. Absent great toe PPG waveform. Left: Resting left ankle-brachial index indicates moderate left lower extremity arterial  disease. The left toe-brachial index is abnormal. Guidlines:  Patients with an ABI >1.2 or <0.9 have a high risk of peripheral atherosclerosis. Patients with asymptomatic peripheral atherosclerosis can be managed without further referral, at the discretion of the ordering provider. Guidelines for medical therapy include the following: - Patients with ABI evidence of peripheral atherosclerosis and symptoms of ischemic rest pain, with or without lower extremities wounds should be referred to a vascular specialist for urgent evaluation. The following therapies are recommendations from the Society of Vascular Surgery for patients with peripheral arterial disease: _ Multidisciplinary comprehensive smoking cessation interventions. _ Blood glucose control with goal A1c < 7%. _ Blood pressure control, < 140/90 mmHg. _ Statin therapy, goal LDL-C at  least <100 mg/dL. _ Supervised exercise program (30-60 min of walking, three times a week) for patients with intermittent claudication. _ Aspirin 52m by mouth daily  *See table(s) above for measurements and observations.  Electronically signed by CDeitra MayoMD on 08/31/2021 at 10:52:34 AM.    Final       Subjective: Patient seen and examined at the bedside this morning.  Hemodynamically stable for discharge today.  Discharge Exam: Vitals:   09/06/21 0851 09/06/21 1236  BP: 96/70 119/87  Pulse: 90 100  Resp: 18 18  Temp: 98 F (36.7 C) 98.4 F (36.9 C)  SpO2: 97% 99%   Vitals:   09/05/21 2352 09/06/21 0300 09/06/21 0851 09/06/21 1236  BP: 96/69 101/76 96/70 119/87  Pulse: 92  90 100  Resp: _0 Temp: 98.6 F (37 C) 98.6 F (37 C) 98 F (36.7 C) 98.4 F (36.9 C)  TempSrc: Axillary Oral Oral Oral  SpO2: 97%  97% 99%  Weight:      Height:        General: Pt is alert, awake, not in acute distress Cardiovascular: RRR, S1/S2 +, no rubs, no gallops Respiratory: CTA bilaterally, no wheezing, no rhonchi Abdominal: Soft, NT, ND, bowel  sounds + Extremities: no edema, no cyanosis, right foot wrapped with dressing    The results of significant diagnostics from this hospitalization (including imaging, microbiology, ancillary and laboratory) are listed below for reference.     Microbiology: Recent Results (from the past 240 hour(s))  Resp Panel by RT-PCR (Flu A&B, Covid) Urine, Clean Catch     Status: None   Collection Time: 08/29/21  7:14 PM   Specimen: Urine, Clean Catch; Nasal Swab  Result Value Ref Range Status   SARS Coronavirus 2 by RT PCR NEGATIVE NEGATIVE Final    Comment: (NOTE) SARS-CoV-2 target nucleic acids are NOT DETECTED.  The SARS-CoV-2 RNA is generally detectable in upper respiratory specimens during the acute phase of infection. The lowest concentration of SARS-CoV-2 viral copies this assay can detect is 138 copies/mL. A negative result does not preclude SARS-Cov-2 infection and should not be used as the sole basis for treatment or other patient management decisions. A negative result may occur with  improper specimen collection/handling, submission of specimen other than nasopharyngeal swab, presence of viral mutation(s) within the areas targeted by this assay, and inadequate number of viral copies(<138 copies/mL). A negative result must be combined with clinical observations, patient history, and epidemiological information. The expected result is Negative.  Fact Sheet for Patients:  hEntrepreneurPulse.com.au Fact Sheet for Healthcare Providers:  hIncredibleEmployment.be This test is no t yet approved or cleared by the [place] and  has been authorized for detection and/or diagnosis of SARS-CoV-2 by FDA under an Emergency Use Authorization (EUA). This EUA will remain  in effect (meaning this test can be used) for the duration of the COVID-19 declaration under Section 564(b)(1) of the Act, 21 U.S.C.section 360bbb-3(b)(1), unless the authorization  is terminated  or revoked sooner.       Influenza A by PCR NEGATIVE NEGATIVE Final   Influenza B by PCR NEGATIVE NEGATIVE Final    Comment: (NOTE) The Xpert Xpress SARS-CoV-2/FLU/RSV plus assay is intended as an aid in the diagnosis of influenza from Nasopharyngeal swab specimens and should not be used as a sole basis for treatment. Nasal washings and aspirates are unacceptable for Xpert Xpress SARS-CoV-2/FLU/RSV testing.  Fact Sheet for Patients: hEntrepreneurPulse.com.au Fact Sheet for Healthcare Providers:  IncredibleEmployment.be  This test is not yet approved or cleared by the [place] and has been authorized for detection and/or diagnosis of SARS-CoV-2 by FDA under an Emergency Use Authorization (EUA). This EUA will remain in effect (meaning this test can be used) for the duration of the COVID-19 declaration under Section 564(b)(1) of the Act, 21 U.S.C. section 360bbb-3(b)(1), unless the authorization is terminated or revoked.  Performed at [facility], [place] [address] [place], [place] [address]   Culture, blood (routine x 2)     Status: Abnormal   Collection Time: 08/29/21  8:05 PM   Specimen: BLOOD  Result Value Ref Range Status   Specimen Description BLOOD RIGHT ANTECUBITAL  Final   Special Requests   Final    BOTTLES DRAWN AEROBIC AND ANAEROBIC Blood Culture adequate volume   Culture  Setup Time   Final    GRAM POSITIVE COCCI IN CLUSTERS IN BOTH AEROBIC AND ANAEROBIC BOTTLES CRITICAL RESULT CALLED TO, READ BACK BY AND VERIFIED WITH: Adrian ON 08/30/21 @ 1943 BY DRT Performed at [facility], [place] [address], [place], [place] [address]    Culture METHICILLIN RESISTANT STAPHYLOCOCCUS AUREUS (A)  Final   Report Status 09/01/2021 FINAL  Final   Organism ID, Bacteria METHICILLIN RESISTANT STAPHYLOCOCCUS AUREUS  Final      Susceptibility   Methicillin resistant staphylococcus aureus - MIC*     CIPROFLOXACIN >=8 RESISTANT Resistant     ERYTHROMYCIN >=8 RESISTANT Resistant     GENTAMICIN <=0.5 SENSITIVE Sensitive     OXACILLIN >=4 RESISTANT Resistant     TETRACYCLINE <=1 SENSITIVE Sensitive     VANCOMYCIN 1 SENSITIVE Sensitive     TRIMETH/SULFA >=320 RESISTANT Resistant     CLINDAMYCIN >=8 RESISTANT Resistant     RIFAMPIN <=0.5 SENSITIVE Sensitive     Inducible Clindamycin NEGATIVE Sensitive     * METHICILLIN RESISTANT STAPHYLOCOCCUS AUREUS  Blood Culture ID Panel (Reflexed)     Status: Abnormal   Collection Time: 08/29/21  8:05 PM  Result Value Ref Range Status   Enterococcus faecalis NOT DETECTED NOT DETECTED Final   Enterococcus Faecium NOT DETECTED NOT DETECTED Final   Listeria monocytogenes NOT DETECTED NOT DETECTED Final   Staphylococcus species DETECTED (A) NOT DETECTED Final    Comment: CRITICAL RESULT CALLED TO, READ BACK BY AND VERIFIED WITH: PHARMD FRANK WILSON ON 08/30/21 @ 1943 BY DRT    Staphylococcus aureus (BCID) DETECTED (A) NOT DETECTED Final    Comment: Methicillin (oxacillin)-resistant Staphylococcus aureus (MRSA). MRSA is predictably resistant to beta-lactam antibiotics (except ceftaroline). Preferred therapy is vancomycin unless clinically contraindicated. Patient requires contact precautions if  hospitalized. CRITICAL RESULT CALLED TO, READ BACK BY AND VERIFIED WITH: [place] ON 08/30/21 @ 1943 BY DRT    Staphylococcus epidermidis NOT DETECTED NOT DETECTED Final   Staphylococcus lugdunensis NOT DETECTED NOT DETECTED Final   Streptococcus species NOT DETECTED NOT DETECTED Final   Streptococcus agalactiae NOT DETECTED NOT DETECTED Final   Streptococcus pneumoniae NOT DETECTED NOT DETECTED Final   Streptococcus pyogenes NOT DETECTED NOT DETECTED Final   A.calcoaceticus-baumannii NOT DETECTED NOT DETECTED Final   Bacteroides fragilis NOT DETECTED NOT DETECTED Final   Enterobacterales NOT DETECTED NOT DETECTED Final   Enterobacter cloacae  complex NOT DETECTED NOT DETECTED Final   Escherichia coli NOT DETECTED NOT DETECTED Final   Klebsiella aerogenes NOT DETECTED NOT DETECTED Final   Klebsiella oxytoca NOT DETECTED NOT DETECTED Final   Klebsiella pneumoniae NOT  DETECTED NOT DETECTED Final   Proteus species NOT DETECTED NOT DETECTED Final   Salmonella species NOT DETECTED NOT DETECTED Final   Serratia marcescens NOT DETECTED NOT DETECTED Final   Haemophilus influenzae NOT DETECTED NOT DETECTED Final   Neisseria meningitidis NOT DETECTED NOT DETECTED Final   Pseudomonas aeruginosa NOT DETECTED NOT DETECTED Final   Stenotrophomonas maltophilia NOT DETECTED NOT DETECTED Final   Candida albicans NOT DETECTED NOT DETECTED Final   Candida auris NOT DETECTED NOT DETECTED Final   Candida glabrata NOT DETECTED NOT DETECTED Final   Candida krusei NOT DETECTED NOT DETECTED Final   Candida parapsilosis NOT DETECTED NOT DETECTED Final   Candida tropicalis NOT DETECTED NOT DETECTED Final   Cryptococcus neoformans/gattii NOT DETECTED NOT DETECTED Final   Meth resistant mecA/C and MREJ DETECTED (A) NOT DETECTED Final    Comment: CRITICAL RESULT CALLED TO, READ BACK BY AND VERIFIED WITH: PHARMD FRANK WILSON ON 08/30/21 @ 1943 BY DRT Performed at [facility], [place] [address] [place], [place] [address]   Urine Culture     Status: Abnormal   Collection Time: 08/29/21 11:11 PM   Specimen: Urine, Clean Catch  Result Value Ref Range Status   Specimen Description URINE, CLEAN CATCH  Final   Special Requests NONE  Final   Culture (A)  Final    <10,000 COLONIES/mL INSIGNIFICANT GROWTH Performed at [facility], [place] [address], [place], [place] [address]    Report Status 08/30/2021 FINAL  Final  Respiratory (~20 pathogens) panel by PCR     Status: None   Collection Time: 08/30/21 12:04 AM   Specimen: Urine, Clean Catch; Respiratory  Result Value Ref Range Status   Adenovirus NOT DETECTED NOT DETECTED Final   Coronavirus 229E  NOT DETECTED NOT DETECTED Final    Comment: (NOTE) The Coronavirus on the Respiratory Panel, DOES NOT test for the novel  Coronavirus (2019 nCoV)    Coronavirus HKU1 NOT DETECTED NOT DETECTED Final   Coronavirus NL63 NOT DETECTED NOT DETECTED Final   Coronavirus OC43 NOT DETECTED NOT DETECTED Final   Metapneumovirus NOT DETECTED NOT DETECTED Final   Rhinovirus / Enterovirus NOT DETECTED NOT DETECTED Final   Influenza A NOT DETECTED NOT DETECTED Final   Influenza B NOT DETECTED NOT DETECTED Final   Parainfluenza Virus 1 NOT DETECTED NOT DETECTED Final   Parainfluenza Virus 2 NOT DETECTED NOT DETECTED Final   Parainfluenza Virus 3 NOT DETECTED NOT DETECTED Final   Parainfluenza Virus 4 NOT DETECTED NOT DETECTED Final   Respiratory Syncytial Virus NOT DETECTED NOT DETECTED Final   Bordetella pertussis NOT DETECTED NOT DETECTED Final   Bordetella Parapertussis NOT DETECTED NOT DETECTED Final   Chlamydophila pneumoniae NOT DETECTED NOT DETECTED Final   Mycoplasma pneumoniae NOT DETECTED NOT DETECTED Final    Comment: Performed at [facility], [place] [address] [place], [place] [address]  Culture, blood (Routine X 2) w Reflex to ID Panel     Status: None   Collection Time: 08/30/21  2:32 AM   Specimen: BLOOD RIGHT HAND  Result Value Ref Range Status   Specimen Description BLOOD RIGHT HAND  Final   Special Requests   Final    BOTTLES DRAWN AEROBIC ONLY Blood Culture results may not be optimal due to an inadequate volume of blood received in culture bottles   Culture   Final    NO GROWTH 5 DAYS Performed at [facility], [place] [address], [place], [place]  [address]    Report Status 09/04/2021 FINAL  Final  MRSA Next Gen by PCR, Nasal     Status: Abnormal   Collection Time: 08/30/21  4:33 AM   Specimen: Nasal Mucosa; Nasal Swab  Result Value Ref Range Status   MRSA by PCR Next Gen DETECTED (A) NOT DETECTED Final    Comment: RESULT CALLED TO, READ BACK BY AND VERIFIED WITH: T  IRBY,RN_[id number]  08/30/21 [place] (NOTE) The GeneXpert MRSA Assay (FDA approved for NASAL specimens only), is one component of a comprehensive MRSA colonization surveillance program. It is not intended to diagnose MRSA infection nor to guide or monitor treatment for MRSA infections. Test performance is not FDA approved in patients less than 62 years old. Performed at [facility], [place] [address] [place], [place] [address]   Culture, blood (Routine X 2) w Reflex to ID Panel     Status: None   Collection Time: 08/30/21 10:30 PM   Specimen: BLOOD RIGHT HAND  Result Value Ref Range Status   Specimen Description BLOOD RIGHT HAND  Final   Special Requests AEROBIC BOTTLE ONLY Blood Culture adequate volume  Final   Culture   Final    NO GROWTH 5 DAYS Performed at [facility], [place] [address], [place], [place] [address]    Report Status 09/04/2021 FINAL  Final  Culture, blood (Routine X 2) w Reflex to ID Panel     Status: None   Collection Time: 08/30/21 10:30 PM   Specimen: BLOOD LEFT HAND  Result Value Ref Range Status   Specimen Description BLOOD LEFT HAND  Final   Special Requests AEROBIC BOTTLE ONLY Blood Culture adequate volume  Final   Culture   Final    NO GROWTH 5 DAYS Performed at [facility], [place] [address], [place], [place] [address]    Report Status 09/04/2021 FINAL  Final     Labs: BNP (last 3 results) No results for input(s): BNP in the last 8760 hours. Basic Metabolic Panel: Recent Labs  Lab 08/31/21 0224 09/01/21 0454 09/02/21 0329 09/05/21 0606  NA 130* 130* 133* 133*  K 3.8 3.7 4.1 4.4  CL 104 104 103 103  CO2 19* 20* 23 21*  GLUCOSE 250* 189* 204* 225*  BUN 22* 18 21* 16  CREATININE 1.35* 1.14 1.11 0.96  CALCIUM 8.6* 8.8* 9.2 9.0   Liver Function Tests: No results for input(s): AST, ALT, ALKPHOS, BILITOT, PROT, ALBUMIN in the last 168 hours. No results for input(s): LIPASE, AMYLASE in the last 168 hours. No results for input(s): AMMONIA in  the last 168 hours. CBC: Recent Labs  Lab 08/31/21 0224 09/02/21 0329 09/03/21 0314 09/04/21 0419  WBC 7.2 8.3 8.7 10.4  HGB 14.4 14.4 13.8 14.0  HCT 41.7 41.9 38.7* 40.2  MCV 93.1 92.1 91.7 91.6  PLT 166 210 225 240   Cardiac Enzymes: Recent Labs  Lab 09/06/21 1226  CKTOTAL 36*   BNP: Invalid input(s): POCBNP CBG: Recent Labs  Lab 09/05/21 1143 09/05/21 1536 09/05/21 2045 09/06/21 0619 09/06/21 1233  GLUCAP 266* 232* 347* 209* 193*   D-Dimer No results for input(s): DDIMER in the last 72 hours. Hgb A1c No results for input(s): HGBA1C in the last 72 hours. Lipid Profile No results for input(s): CHOL, HDL, LDLCALC, TRIG, CHOLHDL, LDLDIRECT in the last 72 hours. Thyroid function studies No results for input(s): TSH, T4TOTAL, T3FREE, THYROIDAB in the last 72 hours.  Invalid input(s): FREET3 Anemia work up No results  for input(s): VITAMINB12, FOLATE, FERRITIN, TIBC, IRON, RETICCTPCT in the last 72 hours. Urinalysis    Component Value Date/Time   COLORURINE STRAW (A) 08/29/2021 1914   APPEARANCEUR CLEAR 08/29/2021 1914   LABSPEC 1.009 08/29/2021 1914   PHURINE 6.0 08/29/2021 1914   GLUCOSEU >=500 (A) 08/29/2021 1914   HGBUR NEGATIVE 08/29/2021 [place] NEGATIVE 08/29/2021 1914   BILIRUBINUR negative 09/25/2019 [place] 08/29/2021 1914   PROTEINUR 100 (A) 08/29/2021 1914   UROBILINOGEN 1.0 09/25/2019 1555   NITRITE NEGATIVE 08/29/2021 1914   LEUKOCYTESUR NEGATIVE 08/29/2021 1914   Sepsis Labs Invalid input(s): PROCALCITONIN,  WBC,  LACTICIDVEN Microbiology Recent Results (from the past 240 hour(s))  Resp Panel by RT-PCR (Flu A&B, Covid) Urine, Clean Catch     Status: None   Collection Time: 08/29/21  7:14 PM   Specimen: Urine, Clean Catch; Nasal Swab  Result Value Ref Range Status   SARS Coronavirus 2 by RT PCR NEGATIVE NEGATIVE Final    Comment: (NOTE) SARS-CoV-2 target nucleic acids are NOT DETECTED.  The SARS-CoV-2 RNA  is generally detectable in upper respiratory specimens during the acute phase of infection. The lowest concentration of SARS-CoV-2 viral copies this assay can detect is 138 copies/mL. A negative result does not preclude SARS-Cov-2 infection and should not be used as the sole basis for treatment or other patient management decisions. A negative result may occur with  improper specimen collection/handling, submission of specimen other than nasopharyngeal swab, presence of viral mutation(s) within the areas targeted by this assay, and inadequate number of viral copies(<138 copies/mL). A negative result must be combined with clinical observations, patient history, and epidemiological information. The expected result is Negative.  Fact Sheet for Patients:  EntrepreneurPulse.com.au  Fact Sheet for Healthcare Providers:  IncredibleEmployment.be  This test is no t yet approved or cleared by the [place] FDA and  has been authorized for detection and/or diagnosis of SARS-CoV-2 by FDA under an Emergency Use Authorization (EUA). This EUA will remain  in effect (meaning this test can be used) for the duration of the COVID-19 declaration under Section 564(b)(1) of the Act, 21 U.S.C.section 360bbb-3(b)(1), unless the authorization is terminated  or revoked sooner.       Influenza A by PCR NEGATIVE NEGATIVE Final   Influenza B by PCR NEGATIVE NEGATIVE Final    Comment: (NOTE) The Xpert Xpress SARS-CoV-2/FLU/RSV plus assay is intended as an aid in the diagnosis of influenza from Nasopharyngeal swab specimens and should not be used as a sole basis for treatment. Nasal washings and aspirates are unacceptable for Xpert Xpress SARS-CoV-2/FLU/RSV testing.  Fact Sheet for Patients: EntrepreneurPulse.com.au  Fact Sheet for Healthcare Providers: IncredibleEmployment.be  This test is not yet approved or cleared by the  [place] FDA and has been authorized for detection and/or diagnosis of SARS-CoV-2 by FDA under an Emergency Use Authorization (EUA). This EUA will remain in effect (meaning this test can be used) for the duration of the COVID-19 declaration under Section 564(b)(1) of the Act, 21 U.S.C. section 360bbb-3(b)(1), unless the authorization is terminated or revoked.  Performed at [facility], [place] [address] [place], [place] [address]   Culture, blood (routine x 2)     Status: Abnormal   Collection Time: 08/29/21  8:05 PM   Specimen: BLOOD  Result Value Ref Range Status   Specimen Description BLOOD RIGHT ANTECUBITAL  Final   Special Requests   Final    BOTTLES DRAWN AEROBIC AND ANAEROBIC Blood  Culture adequate volume   Culture  Setup Time   Final    GRAM POSITIVE COCCI IN CLUSTERS IN BOTH AEROBIC AND ANAEROBIC BOTTLES CRITICAL RESULT CALLED TO, READ BACK BY AND VERIFIED WITH: [place] ON 08/30/21 @ 1943 BY DRT Performed at [facility], [place] [address] [place], [place] [address]    Culture METHICILLIN RESISTANT STAPHYLOCOCCUS AUREUS (A)  Final   Report Status 09/01/2021 FINAL  Final   Organism ID, Bacteria METHICILLIN RESISTANT STAPHYLOCOCCUS AUREUS  Final      Susceptibility   Methicillin resistant staphylococcus aureus - MIC*    CIPROFLOXACIN >=8 RESISTANT Resistant     ERYTHROMYCIN >=8 RESISTANT Resistant     GENTAMICIN <=0.5 SENSITIVE Sensitive     OXACILLIN >=4 RESISTANT Resistant     TETRACYCLINE <=1 SENSITIVE Sensitive     VANCOMYCIN 1 SENSITIVE Sensitive     TRIMETH/SULFA >=320 RESISTANT Resistant     CLINDAMYCIN >=8 RESISTANT Resistant     RIFAMPIN <=0.5 SENSITIVE Sensitive     Inducible Clindamycin NEGATIVE Sensitive     * METHICILLIN RESISTANT STAPHYLOCOCCUS AUREUS  Blood Culture ID Panel (Reflexed)     Status: Abnormal   Collection Time: 08/29/21  8:05 PM  Result Value Ref Range Status   Enterococcus faecalis NOT DETECTED NOT DETECTED Final    Enterococcus Faecium NOT DETECTED NOT DETECTED Final   Listeria monocytogenes NOT DETECTED NOT DETECTED Final   Staphylococcus species DETECTED (A) NOT DETECTED Final    Comment: CRITICAL RESULT CALLED TO, READ BACK BY AND VERIFIED WITH: PHARMD FRANK WILSON ON 08/30/21 @ 1943 BY DRT    Staphylococcus aureus (BCID) DETECTED (A) NOT DETECTED Final    Comment: Methicillin (oxacillin)-resistant Staphylococcus aureus (MRSA). MRSA is predictably resistant to beta-lactam antibiotics (except ceftaroline). Preferred therapy is vancomycin unless clinically contraindicated. Patient requires contact precautions if  hospitalized. CRITICAL RESULT CALLED TO, READ BACK BY AND VERIFIED WITH: [place] ON 08/30/21 @ 1943 BY DRT    Staphylococcus epidermidis NOT DETECTED NOT DETECTED Final   Staphylococcus lugdunensis NOT DETECTED NOT DETECTED Final   Streptococcus species NOT DETECTED NOT DETECTED Final   Streptococcus agalactiae NOT DETECTED NOT DETECTED Final   Streptococcus pneumoniae NOT DETECTED NOT DETECTED Final   Streptococcus pyogenes NOT DETECTED NOT DETECTED Final   A.calcoaceticus-baumannii NOT DETECTED NOT DETECTED Final   Bacteroides fragilis NOT DETECTED NOT DETECTED Final   Enterobacterales NOT DETECTED NOT DETECTED Final   Enterobacter cloacae complex NOT DETECTED NOT DETECTED Final   Escherichia coli NOT DETECTED NOT DETECTED Final   Klebsiella aerogenes NOT DETECTED NOT DETECTED Final   Klebsiella oxytoca NOT DETECTED NOT DETECTED Final   Klebsiella pneumoniae NOT DETECTED NOT DETECTED Final   Proteus species NOT DETECTED NOT DETECTED Final   Salmonella species NOT DETECTED NOT DETECTED Final   Serratia marcescens NOT DETECTED NOT DETECTED Final   Haemophilus influenzae NOT DETECTED NOT DETECTED Final   Neisseria meningitidis NOT DETECTED NOT DETECTED Final   Pseudomonas aeruginosa NOT DETECTED NOT DETECTED Final   Stenotrophomonas maltophilia NOT DETECTED NOT DETECTED  Final   Candida albicans NOT DETECTED NOT DETECTED Final   Candida auris NOT DETECTED NOT DETECTED Final   Candida glabrata NOT DETECTED NOT DETECTED Final   Candida krusei NOT DETECTED NOT DETECTED Final   Candida parapsilosis NOT DETECTED NOT DETECTED Final   Candida tropicalis NOT DETECTED NOT DETECTED Final   Cryptococcus neoformans/gattii NOT DETECTED NOT DETECTED Final   Meth resistant mecA/C and MREJ DETECTED (A)  NOT DETECTED Final    Comment: CRITICAL RESULT CALLED TO, READ BACK BY AND VERIFIED WITH: [place] ON 08/30/21 @ 1943 BY DRT Performed at [facility], [place] [address], [place], [place] [address]   Urine Culture     Status: Abnormal   Collection Time: 08/29/21 11:11 PM   Specimen: Urine, Clean Catch  Result Value Ref Range Status   Specimen Description URINE, CLEAN CATCH  Final   Special Requests NONE  Final   Culture (A)  Final    <10,000 COLONIES/mL INSIGNIFICANT GROWTH Performed at [facility], [place] [address] [place], [place] [address]    Report Status 08/30/2021 FINAL  Final  Respiratory (~20 pathogens) panel by PCR     Status: None   Collection Time: 08/30/21 12:04 AM   Specimen: Urine, Clean Catch; Respiratory  Result Value Ref Range Status   Adenovirus NOT DETECTED NOT DETECTED Final   Coronavirus 229E NOT DETECTED NOT DETECTED Final    Comment: (NOTE) The Coronavirus on the Respiratory Panel, DOES NOT test for the novel  Coronavirus (2019 nCoV)    Coronavirus HKU1 NOT DETECTED NOT DETECTED Final   Coronavirus NL63 NOT DETECTED NOT DETECTED Final   Coronavirus OC43 NOT DETECTED NOT DETECTED Final   Metapneumovirus NOT DETECTED NOT DETECTED Final   Rhinovirus / Enterovirus NOT DETECTED NOT DETECTED Final   Influenza A NOT DETECTED NOT DETECTED Final   Influenza B NOT DETECTED NOT DETECTED Final   Parainfluenza Virus 1 NOT DETECTED NOT DETECTED Final   Parainfluenza Virus 2 NOT DETECTED NOT DETECTED Final   Parainfluenza Virus 3  NOT DETECTED NOT DETECTED Final   Parainfluenza Virus 4 NOT DETECTED NOT DETECTED Final   Respiratory Syncytial Virus NOT DETECTED NOT DETECTED Final   Bordetella pertussis NOT DETECTED NOT DETECTED Final   Bordetella Parapertussis NOT DETECTED NOT DETECTED Final   Chlamydophila pneumoniae NOT DETECTED NOT DETECTED Final   Mycoplasma pneumoniae NOT DETECTED NOT DETECTED Final    Comment: Performed at [facility], [place] [address] [place], [place] [address]  Culture, blood (Routine X 2) w Reflex to ID Panel     Status: None   Collection Time: 08/30/21  2:32 AM   Specimen: BLOOD RIGHT HAND  Result Value Ref Range Status   Specimen Description BLOOD RIGHT HAND  Final   Special Requests   Final    BOTTLES DRAWN AEROBIC ONLY Blood Culture results may not be optimal due to an inadequate volume of blood received in culture bottles   Culture   Final    NO GROWTH 5 DAYS Performed at [facility], [place] [address] [place], [place] [address]    Report Status 09/04/2021 FINAL  Final  MRSA Next Gen by PCR, Nasal     Status: Abnormal   Collection Time: 08/30/21  4:33 AM   Specimen: Nasal Mucosa; Nasal Swab  Result Value Ref Range Status   MRSA by PCR Next Gen DETECTED (A) NOT DETECTED Final    Comment: RESULT CALLED TO, READ BACK BY AND VERIFIED WITH: T [id number],RN_0  08/30/21 [place] (NOTE) The GeneXpert MRSA Assay (FDA approved for NASAL specimens only), is one component of a comprehensive MRSA colonization surveillance program. It is not intended to diagnose MRSA infection nor to guide or monitor treatment for MRSA infections. Test performance is not FDA approved in patients less than 57 years old. Performed at [facility], [place] [address] [place], [place] [address]  Culture, blood (Routine X 2) w Reflex to ID Panel     Status: None   Collection Time: 08/30/21 10:30 PM   Specimen: BLOOD RIGHT HAND  Result Value Ref Range Status   Specimen Description BLOOD RIGHT HAND  Final    Special Requests AEROBIC BOTTLE ONLY Blood Culture adequate volume  Final   Culture   Final    NO GROWTH 5 DAYS Performed at [facility], [place] [address], [place], [place] [address]    Report Status 09/04/2021 FINAL  Final  Culture, blood (Routine X 2) w Reflex to ID Panel     Status: None   Collection Time: 08/30/21 10:30 PM   Specimen: BLOOD LEFT HAND  Result Value Ref Range Status   Specimen Description BLOOD LEFT HAND  Final   Special Requests AEROBIC BOTTLE ONLY Blood Culture adequate volume  Final   Culture   Final    NO GROWTH 5 DAYS Performed at [facility], [place] [address] [place], [place] [address]    Report Status 09/04/2021 FINAL  Final    Please note: You were cared for by a hospitalist during your hospital stay. Once you are discharged, your primary care physician will handle any further medical issues. Please note that NO REFILLS for any discharge medications will be authorized once you are discharged, as it is imperative that you return to your primary care physician (or establish a relationship with a primary care physician if you do not have one) for your post hospital discharge needs so that they can reassess your need for medications and monitor your lab values.    Time coordinating discharge: 40 minutes  SIGNED:   Shelly Coss, MD  [facility] 09/06/2021, 3:41 PM Pager [phone]  If 7PM-7AM, please contact night-coverage [email or web address] Password [email or web address]

## 2021-09-06 NOTE — TOC Transition Note (Signed)
Transition of Care (TOC) - CM/SW Discharge Note [name] RN, BSN Transitions of Care Unit 4E- RN Case Manager See Treatment Team for direct phone #    Patient Details  Name: David [name] MRN: [id number] Date of Birth: 1960-06-24  Transition of Care [facility]) CM/SW Contact:  [name], RN Phone Number: 09/06/2021, 2:08 PM   Clinical Narrative:    Pt stable for transition home today (staying at extended stay motel). CM called [facility] to confirm [facility]- per [name] pt has been accepted into [facility] program for [facility] needs- IV abx-RN, PICC line care.  Also spoke with [name] at [place] home infusion and she is working with ID to confirm home abx dosing/need and then will come speak with pt around lunchtime and provide bedside education.  PICC line as been placed.  CM has confirmed rolling walker has been delivered to bedside for discharge.   1215- OPAT orders have been placed and [name] here at bedside to speak with pt  Meds to be filled by [facility] pharmacy, CM has placed pt into [facility] system for medication assistance needs. TOC to deliver meds to bedside prior to discharge.   [facility] to provide nursing visit tomorrow.   Pt has taxi voucher for transportation needs.    Final next level of care: [name] Barriers to Discharge: No Barriers Identified (Providing transportation via Taxi to get home)   Patient Goals and CMS Choice Patient states their goals for this hospitalization and ongoing recovery are:: Go home CMS Medicare.gov Compare Post Acute Care list provided to:: Patient Choice offered to / list presented to : Patient (charity care)  Discharge Placement               Home/motel w/ [facility]        Discharge Plan and Services   Discharge Planning Services: [facility] Program Post Acute Care Choice: Home Health, Durable Medical Equipment          DME Arranged: Walker rolling DME Agency: [employer] Date DME Agency Contacted: 09/05/21 Time DME Agency  Contacted: [id number] Representative spoke with at DME Agency: [place]: RN, IV Antibiotics [place] Agency: Tour manager, [place] Wheeler ([place]) Date [place]: 09/06/21 Time [place]: 1000 Representative spoke with at [place]: Ashley/[name]  Social Determinants of Health ([place]) Interventions     Readmission Risk Interventions    09/06/2021   12:07 PM 08/29/2019    9:50 AM  Readmission Risk Prevention Plan  Post Dischage Appt Complete   Medication Screening Complete Complete  Transportation Screening Complete Complete

## 2021-09-07 ENCOUNTER — Other Ambulatory Visit: Payer: Self-pay | Admitting: Pharmacist

## 2021-09-07 ENCOUNTER — Other Ambulatory Visit: Payer: Self-pay | Admitting: Family Medicine

## 2021-09-07 ENCOUNTER — Other Ambulatory Visit: Payer: Self-pay

## 2021-09-07 ENCOUNTER — Telehealth: Payer: Self-pay | Admitting: Nurse Practitioner

## 2021-09-07 ENCOUNTER — Telehealth: Payer: Self-pay

## 2021-09-07 ENCOUNTER — Telehealth: Payer: Self-pay | Admitting: Physician Assistant

## 2021-09-07 DIAGNOSIS — I480 Paroxysmal atrial fibrillation: Secondary | ICD-10-CM

## 2021-09-07 DIAGNOSIS — I429 Cardiomyopathy, unspecified: Secondary | ICD-10-CM

## 2021-09-07 MED ORDER — DIGOXIN 125 MCG PO TABS
125.0000 ug | ORAL_TABLET | Freq: Every day | ORAL | 0 refills | Status: DC
  Filled 2021-09-07 – 2021-09-16 (×2): qty 30, 30d supply, fill #0

## 2021-09-07 MED ORDER — ENTRESTO 49-51 MG PO TABS: 1.0000 | ORAL_TABLET | Freq: Two times a day (BID) | ORAL | 0 refills | Status: DC

## 2021-09-07 NOTE — Telephone Encounter (Signed)
Called David [name] provided

## 2021-09-07 NOTE — Telephone Encounter (Addendum)
Transition Care Management Follow-up Telephone Call Date of discharge and from where: 09/06/2021, [facility] How have you been since you were released from the hospital? He said he is not doing great, since he is now "missing a digit." Any questions or concerns? Yes- he is concerned about being out of work for an extended period of time and said that his employer may be able to give him some part time hours so he would be able to stay off of his feet.   Items Reviewed: Did the pt receive and understand the discharge instructions provided? Yes  Medications obtained and verified? Yes - he said he has all of his medications, including the IV meds,  and he did not have any questions about the med regime at this time. He has a glucometer.  Other? No  Any new allergies since your discharge? No  Dietary orders reviewed? Yes- he said he is trying to monitor his carbs and will plan to schedule an appointment with Butch Penny, [facility] for further diabetic education. 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  Do you have support at home? Yes - family  Home Care and Equipment/Supplies: Were home health services ordered? yes If so, what is the name of the agency? [facility]  Has the agency set up a time to come to the patient's home? Yes - the nurse is coming this afternoon. Were any new equipment or medical supplies ordered?  Yes: infusion supplies and RW.  What is the name of the medical supply agency? IV medication/infusion supplies- [employer], [facility] - RW Were you able to get the supplies/equipment? yes Do you have any questions related to the use of the equipment or supplies? No- not at this time.   Functional Questionnaire: (I = Independent and D = Dependent) ADLs: He has a RW to use with ambulation but said that he has the most mobility sitting on  a wheeled desk chair. Independent with personal care.    Follow  up appointments reviewed:  PCP Hospital f/u appt confirmed? Yes  Scheduled to see Bertram Denver, NP - 10/08/2021.  He did not want to reschedule to be seen sooner. Specialist Hospital f/u appt confirmed? Yes  Scheduled to see [facility]- 09/21/2021. He needs to call orthopedics to schedule his follow up  Are transportation arrangements needed? No  If their condition worsens, is the pt aware to call PCP or go to the Emergency Dept.? Yes Was the patient provided with contact information for the PCP's office or ED? Yes Was to pt encouraged to call back with questions or concerns? Yes

## 2021-09-07 NOTE — Telephone Encounter (Signed)
Advised David [name] should be 25 mg daily as of March 2023.   David stated the medication list from discharge paperwork told the patient to take Entresto 49-51 mg and the patient only had 3 bottles of 24-26 mg at home being filled by Raul Del the patients PCP. In our med list there is a reorder of Entresto 49-51 by Raul Del, on 07/14/21. It doesn't appear that we have ever sent in [facility] for the patient from our office.   From OV with Scott 06/16/21:  Continue Coreg 25 mg twice daily, digoxin 0.125 mg once daily, Lasix 40 mg once daily, [name] 12.5 mg once daily, Entresto 49/51 mg twice daily.  Will forward to [place] for advisement.

## 2021-09-07 NOTE — Telephone Encounter (Signed)
Home Health Verbal Orders - Caller/Agency:  [facility]  Callback [phone] Secure VM per caller  Requesting: Skill nursing   Frequency: 1w7

## 2021-09-07 NOTE — Telephone Encounter (Signed)
Pt c/o medication issue:  1. Name of Medication:  spironolactone (ALDACTONE) 25 MG tablet sacubitril-valsartan (ENTRESTO) 49-51 MG  2. How are you currently taking this medication (dosage and times per day)?  Spironolactone, 1/2 tablet daily.   Entresto 49-51mg  not currently taking      3. Are you having a reaction (difficulty breathing--STAT)? No  4. What is your medication issue? David [name] is calling to confirm how pt is to take these medications due to discharge summary being different to how pt is currently taking meds.  Pt is currently taking Entresto 24-26 mg, 1 tablet, twice daily.  He is taking Spironolactone 1/2 tablet daily.  Please advise.

## 2021-09-08 ENCOUNTER — Other Ambulatory Visit: Payer: Self-pay

## 2021-09-08 ENCOUNTER — Telehealth: Payer: Self-pay | Admitting: Orthopedic Surgery

## 2021-09-08 NOTE — Telephone Encounter (Signed)
His medications were adjusted during his hospital stay.   He was stable on the following CHF/AFib meds: Eliquis 5 mg twice daily  Coreg 3.125 mg twice daily  Digoxin 0.125 mg once daily  Entresto 49/51 mg twice daily  Lasix 40 mg once daily  Spironolactone 25 mg once daily   I would continue these meds at these dosages. Continue other meds as recommended by other providers.  He should also have a f/u with Cardiology.  Please arrange f/u with Dr. Eden Emms or me.  Tereso Newcomer, PA-C    09/08/2021 8:45 AM

## 2021-09-08 NOTE — Telephone Encounter (Signed)
Can you please call pt. Dr. [name] is in surgery all day on the 14th and Denny Peon is out of the office that week. We can see him Monday any time, Tuesday afternoon or Thursday. I can open a spot for whatever time works for him. Please call and let me know.

## 2021-09-08 NOTE — Telephone Encounter (Signed)
Patient called in requesting Post op appt requesting it on the 14th but we have only the 8th and the 19th available he said those are not going to work for him he cannot stand walk or drive and will need some kind of transportation he has an appt with the heart dr on the 14th beign he has transportation that day and was wondering if he could be seen then please advise

## 2021-09-08 NOTE — Telephone Encounter (Signed)
Left message for patient to call back.  Called Amy, home health nurse about Tereso Newcomer PA's response. She requested to have message faxed to her at [phone] to reconcile patient's medications. Made patient an appointment with Tereso Newcomer PA 09/15/21 at 10:05 am.  Amy will let patient know of appointment, and she will call if that time and date does not work for him.

## 2021-09-08 NOTE — Telephone Encounter (Signed)
done

## 2021-09-09 ENCOUNTER — Telehealth: Payer: Self-pay | Admitting: Orthopedic Surgery

## 2021-09-09 ENCOUNTER — Telehealth (HOSPITAL_COMMUNITY): Payer: Self-pay | Admitting: Licensed Clinical Social Worker

## 2021-09-09 ENCOUNTER — Other Ambulatory Visit (HOSPITAL_COMMUNITY): Payer: Self-pay

## 2021-09-09 ENCOUNTER — Other Ambulatory Visit: Payer: Self-pay | Admitting: Orthopedic Surgery

## 2021-09-09 ENCOUNTER — Other Ambulatory Visit: Payer: Self-pay

## 2021-09-09 MED ORDER — HYDROCODONE-ACETAMINOPHEN 5-325 MG PO TABS: 1.0000 | ORAL_TABLET | Freq: Four times a day (QID) | ORAL | 0 refills | Status: DC | PRN

## 2021-09-09 MED ORDER — HYDROCODONE-ACETAMINOPHEN 5-325 MG PO TABS
1.0000 | ORAL_TABLET | Freq: Four times a day (QID) | ORAL | 0 refills | Status: DC | PRN
  Filled 2021-09-09: qty 20, 5d supply, fill #0

## 2021-09-09 NOTE — Telephone Encounter (Signed)
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

## 2021-09-09 NOTE — Telephone Encounter (Signed)
H&V Care Navigation CSW Progress Note  Clinical Social Worker contacted patient by phone to assist with transportation.  CSW referred to assist with transportation as patient is staying in a motel and unable to get a ride to his appointment next week. CSW spoke with patient who states he was able to get a friend to bring him to the appointment and denies any needs at this time. CSW available if needed. Lasandra Beech, LCSW, CCSW-MCS [phone]   SDOH Screenings   Alcohol Screen: Not on file  Depression (PHQ2-9): Low Risk  (01/08/2021)   Depression (PHQ2-9)    PHQ-2 Score: 4  Financial Resource Strain: Not on file  Food Insecurity: Not on file  Housing: Not on file  Physical Activity: Not on file  Social Connections: Not on file  Stress: Stress Concern Present (09/26/2017)   [employer] of Occupational Health - Occupational Stress Questionnaire    Feeling of Stress : Very much  Tobacco Use: Medium Risk (09/06/2021)   Patient History    Smoking Tobacco Use: Former    Smokeless Tobacco Use: Never    Passive Exposure: Not on file  Transportation Needs: Unmet Transportation Needs (09/09/2021)   PRAPARE - Administrator, Civil Service (Medical): Yes    Lack of Transportation (Non-Medical): Yes

## 2021-09-09 NOTE — Telephone Encounter (Signed)
SW Amy, she will let pt know. She did say that he is staying at a motel near [facility] on [address] and asked if those could be resent at that location.

## 2021-09-09 NOTE — Telephone Encounter (Signed)
David [name] is calling --Patient is not taking medication  like he should , as he only had 15,  and will run out, however the patient in not sleeping and rationing his medication   Can more be called in

## 2021-09-10 ENCOUNTER — Other Ambulatory Visit: Payer: Self-pay | Admitting: Pharmacist

## 2021-09-10 ENCOUNTER — Other Ambulatory Visit: Payer: Self-pay

## 2021-09-10 MED ORDER — ENTRESTO 49-51 MG PO TABS: 1.0000 | ORAL_TABLET | Freq: Two times a day (BID) | ORAL | 0 refills | Status: DC

## 2021-09-13 ENCOUNTER — Other Ambulatory Visit: Payer: Self-pay | Admitting: Orthopedic Surgery

## 2021-09-13 ENCOUNTER — Telehealth: Payer: Self-pay | Admitting: Orthopedic Surgery

## 2021-09-13 MED ORDER — HYDROCODONE-ACETAMINOPHEN 10-325 MG PO TABS: 1.0000 | ORAL_TABLET | Freq: Four times a day (QID) | ORAL | 0 refills | Status: DC | PRN

## 2021-09-13 NOTE — Progress Notes (Deleted)
Cardiology Office Note:    Date:  09/13/2021   ID:  [name], DOB 08/25/1960, MRN [id number]  PCP:  Claiborne Rigg, NP  [facility] Providers Cardiologist:  Charlton Haws, MD { Click to update primary MD,subspecialty MD or APP then REFRESH:1}  *** Referring MD: Claiborne Rigg, NP   Chief Complaint:  No chief complaint on file. {Click here for Visit Info    :1}   Patient Profile: Diabetes mellitus  Persistent atrial fibrillation  Failed DCCV in 09/2017 Attempted Sotalol with plan to repeat DCCV; DCCV not done/pt stopped meds  HFimpEF (heart failure with improved ejection fraction)  Probable tachycardia mediated vs HTN Myoview in 2019 with ?mild anterolat ischemia - Focus was on Rx of AF at that time; no report CP Echocardiogram 10/2018: EF 35-40 Echocardiogram 08/2021: EF 55-60  Suspected OSA  Hypertension   Prior CV Studies: TEE 09/03/21  1. Left ventricular ejection fraction, by estimation, is 60 to 65%. The  left ventricle has normal function. The left ventricle has no regional  wall motion abnormalities.   2. Right ventricular systolic function is normal. The right ventricular  size is normal.   3. No left atrial/left atrial appendage thrombus was detected.   4. The mitral valve is normal in structure. Trivial mitral valve  regurgitation. No evidence of mitral stenosis.   5. The aortic valve is tricuspid. Aortic valve regurgitation is not  visualized. No aortic stenosis is present.   6. The inferior vena cava is normal in size with greater than 50%  respiratory variability, suggesting right atrial pressure of 3 mmHg.   ECHO COMPLETE WO IMAGING ENHANCING AGENT 08/31/2021 1. Left ventricular ejection fraction, by estimation, is 55 to 60%. The left ventricle has normal function. The left ventricle has no regional wall motion abnormalities. Left ventricular diastolic parameters are indeterminate. 2. Right ventricular systolic function is low normal. The right ventricular  size is normal. 3. Left atrial size was mild to moderately dilated. 4. The mitral valve is normal in structure. Mild mitral valve regurgitation. 5. The aortic valve is tricuspid. Aortic valve regurgitation is not visualized. Aortic valve sclerosis/calcification is present, without any evidence of aortic stenosis. 6. Aortic dilatation noted. There is mild dilatation of the aortic root, measuring 40 mm. There is mild dilatation of the ascending aorta, measuring 38 mm.   Aortic valve sclerosis/calcification is present, without any evidence of aortic stenosis.   Echocardiogram 10/17/2018 EF 35-40, moderate LVH, moderately reduced RVSF, mild to moderate MR, RVSP 36.3   Myoview 09/29/2017 Mild mid anterolateral reversibility-equivocal finding, EF 44; intermediate risk Reviewed by Dr. Eden Emms - no infarct; ?mild ant-lat ischemia >> focus on Rx of AFib at that time  History of Present Illness:   David [name] is a 61 y.o. male with the above problem list.  He has a prior hx of AFib with failed DCCV.  He was placed on Sotalol with plans to repeat DCCV. However, he never returned for the procedure and stopped his medications.  He was admitted in 10/2018 with AF with RVR and low EF.  The plan at that time was to attempt rate control and repeat his echocardiogram 3 mos later to check for improvement.  if his EF did not improve, he would need an ischemic evaluation.  But, he was lost to f/u.  He was seen in clinic in March 2022.  He did not follow up until March 2023.        At his last visit in  March 2023, he noted symptoms of shortness of breath and fatigue.  A follow up echocardiogram and Myoview ordered but never done.    He was admitted 5/28-6/5 with sepsis in the setting of R foot osteomyelitis c/b atrial fibrillation with RVR.  He was started on antibiotics and underwent ray amputation of the 4th toe.  Blood cultures + for MRSA.  Echocardiogram was obtained and demonstrated improved LVF with EF 55-60.  ABIs  suggested peripheral arterial disease and he underwent R PTA angioplasty with vascular surgery.  Some of his medications were adjusted during his hospitalization.  Current med list should include Coreg 3.125 mg twice daily, Digoxin 0.125 mg once daily, Entresto 49/51 mg twice daily, Lasix 40 mg once daily, spironolactone 25 mg once daily.    He returns for f/u.  ***    Past Medical History:  Diagnosis Date   Cardiomyopathy (HCC)    a. EF 45% in 2019.   CHF (congestive heart failure) (HCC)    Chronic anticoagulation 09/30/2017   Diabetes mellitus type 2 in nonobese [facility])    Does not have health insurance    Essential hypertension 09/26/2017   Financial difficulties    H/O noncompliance with medical treatment, presenting hazards to health    Non-insulin treated type 2 diabetes mellitus (HCC) 09/26/2017   Persistent atrial fibrillation (HCC)    Sleep apnea suspected 09/30/2017   Current Medications: No outpatient medications have been marked as taking for the 09/15/21 encounter (Appointment) with Tereso Newcomer T, PA-C.    Allergies:   Patient has no known allergies.   Social History   Tobacco Use   Smoking status: Former    Years: 15.00    Types: Cigarettes    Quit date: 03/18/2018    Years since quitting: 3.4   Smokeless tobacco: Never   Tobacco comments:    09/26/2017 "2-3 cigarettes/month now"  Vaping Use   Vaping Use: Never used  Substance Use Topics   Alcohol use: Yes    Alcohol/week: 3.0 standard drinks of alcohol    Types: 3 Cans of beer per week   Drug use: Never    Family Hx: The patient's family history includes Hypertension in his mother.  ROS   EKGs/Labs/Other Test Reviewed:    EKG:  EKG is *** ordered today.  The ekg ordered today demonstrates ***  Recent Labs: 06/16/2021: NT-Pro BNP 468 08/30/2021: ALT 17; Magnesium 2.0; TSH 0.837 09/04/2021: Hemoglobin 14.0; Platelets 240 09/05/2021: BUN 16; Creatinine, Ser 0.96; Potassium 4.4; Sodium 133   Recent Lipid  Panel Recent Labs    09/03/21 0314  CHOL 151  TRIG 248*  HDL 23*  VLDL 50*  LDLCALC 78     Risk Assessment/Calculations/Metrics:   {Does this patient have ATRIAL FIBRILLATION?:[id number]}     No BP recorded.  {Refresh Note OR Click here to enter BP  :1}***    Physical Exam:    VS:  There were no vitals taken for this visit.    Wt Readings from Last 3 Encounters:  09/05/21 188 lb 0.8 oz (85.3 kg)  06/16/21 187 lb 9.6 oz (85.1 kg)  01/08/21 176 lb (79.8 kg)    Physical Exam ***     ASSESSMENT & PLAN:   No problem-specific Assessment & Plan notes found for this encounter. *** Chronic HFrEF (heart failure with reduced ejection fraction) (HCC) EF 35-40 by echocardiogram in 10/2018.  It has been suspected that he has a non-ischemic cardiomyopathy due to tachycardia from AFib.  He has  had poor follow up and was last seen 12 months ago.  He had a Myoview in 2019 with ? Ant-lat ischemia.  It was felt that he needed rate control first then pursue further ischemic workup.  He was supposed to get an echocardiogram to reassess LVF after his last visit but this was never done.  He has poor energy and feels lethargic.  He has some shortness of breath with certain activities and describes NYHA II symptoms.  His exam does not suggest volume excess.  His diabetes is poorly controlled ( 01/08/2021: Hemoglobin A1C 11.3).  His symptoms may be from poorly controlled diabetes mellitus, congestive heart failure.  He is at risk for coronary artery disease given his uncontrolled diabetes.  Question if his fatigue is an unusual presentation for angina.  As far as GDMT goes, I am hesitant to place him on SLGT2i in the setting of poorly controlled diabetes as it will result in massive glucosuria and put him at risk for GU infection. Continue Coreg 25 mg twice daily, digoxin 0.125 mg once daily, Lasix 40 mg once daily, Spironolactone 12.5 mg once daily, Entresto 49/51 mg twice daily  Consider SGLT2i once diabetes  is better controlled.  BMET, Dig level, BNP, CBC, TSH today Arrange repeat echocardiogram  Arrange repeat Myoview  F/u 6-8 weeks   Persistent atrial fibrillation (HCC) Pt failed DCCV in 2019.  Records indicate he was put on Sotalol with a plan to repeat DCCV but he stopped taking the medication.  He has been on a rate control strategy since that time.  Today, rate is controlled.  He held Dig to get his level drawn today.  Check Dig, BMET, CBC today.  Continue Eliquis 5 mg twice daily.  He asked if he could be put back into NSR.  If we determine that his fatigue may be due to atrial fibrillation, he will need to see EP to evaluate for rhythm control.  Obtain f/u echocardiogram which will let us know what his atrial size is currently.     Essential hypertension Controlled.  Continue Coreg 25 mg twice daily, Spironolactone 12.5 mg once daily, Entresto 49/51 mg twice daily.     Insulin dependent type 2 diabetes mellitus (HCC) Continue f/u with primary care.    Pure hypercholesterolemia LDL in 10/22.  Goal should be at least < 100.  He is off statin Rx for unclear reasons.  This can be addressed at his next OV.         {Are you ordering a CV Procedure (e.g. stress test, cath, DCCV, TEE, etc)?   Press F2        :[id number]}  Dispo:  No follow-ups on file.   Medication Adjustments/Labs and Tests Ordered: Current medicines are reviewed at length with the patient today.  Concerns regarding medicines are outlined above.  Tests Ordered: No orders of the defined types were placed in this encounter.  Medication Changes: No orders of the defined types were placed in this encounter.  Signed, Tereso Newcomer, PA-C  09/13/2021 9:25 PM    [facility] [address] [place], [place], [place]  [address] Phone: [phone]; Fax: [phone]

## 2021-09-13 NOTE — Telephone Encounter (Signed)
Called and advised pt that rx has been sent to pharm.

## 2021-09-13 NOTE — Telephone Encounter (Signed)
Please see message below. Pt is s/p a ray amputation on 09/04/2021

## 2021-09-13 NOTE — Telephone Encounter (Signed)
Patient called. He would like hydrocodone called in to [facility] on [address]. They have the hydrocodone 10mg  in stock. [facility] did not have hydrocod[facility]. His call back number is [phone]

## 2021-09-14 ENCOUNTER — Encounter: Payer: Self-pay | Admitting: Orthopedic Surgery

## 2021-09-14 ENCOUNTER — Ambulatory Visit: Payer: Self-pay | Admitting: Critical Care Medicine

## 2021-09-15 ENCOUNTER — Encounter (HOSPITAL_COMMUNITY): Payer: Self-pay

## 2021-09-15 ENCOUNTER — Ambulatory Visit: Payer: Self-pay | Admitting: Physician Assistant

## 2021-09-15 ENCOUNTER — Other Ambulatory Visit (HOSPITAL_COMMUNITY): Payer: Self-pay

## 2021-09-15 ENCOUNTER — Other Ambulatory Visit: Payer: Self-pay

## 2021-09-15 ENCOUNTER — Telehealth: Payer: Self-pay

## 2021-09-15 DIAGNOSIS — I96 Gangrene, not elsewhere classified: Secondary | ICD-10-CM

## 2021-09-15 DIAGNOSIS — I10 Essential (primary) hypertension: Secondary | ICD-10-CM

## 2021-09-15 DIAGNOSIS — I4819 Other persistent atrial fibrillation: Secondary | ICD-10-CM

## 2021-09-15 DIAGNOSIS — E119 Type 2 diabetes mellitus without complications: Secondary | ICD-10-CM

## 2021-09-15 DIAGNOSIS — E7800 Pure hypercholesterolemia, unspecified: Secondary | ICD-10-CM

## 2021-09-15 DIAGNOSIS — I5020 Unspecified systolic (congestive) heart failure: Secondary | ICD-10-CM

## 2021-09-15 NOTE — Telephone Encounter (Signed)
Patient called triage line stating he missed his appointment with Dr [name] yesterday because he overslept. He stated he rescheduled the appointment to next week but was wanting some wound care instructions for his wound. He has the original bandage on. Home health to start next Monday. Can you please advise [phone]

## 2021-09-15 NOTE — Telephone Encounter (Signed)
Called pt he had a dry dressing applied after surgery. Confirmed that the dressing was not soiled pt states that it had bled through a very small amount once and advised that he could reinforce this dressing and leave intact until his post op appt. Voiced understanding and will call with questions.

## 2021-09-16 ENCOUNTER — Other Ambulatory Visit: Payer: Self-pay

## 2021-09-21 ENCOUNTER — Other Ambulatory Visit: Payer: Self-pay | Admitting: Nurse Practitioner

## 2021-09-21 ENCOUNTER — Encounter: Payer: Self-pay | Admitting: Internal Medicine

## 2021-09-21 ENCOUNTER — Other Ambulatory Visit: Payer: Self-pay

## 2021-09-21 ENCOUNTER — Encounter: Payer: Self-pay | Admitting: Orthopedic Surgery

## 2021-09-21 ENCOUNTER — Ambulatory Visit (INDEPENDENT_AMBULATORY_CARE_PROVIDER_SITE_OTHER): Payer: Self-pay | Admitting: Orthopedic Surgery

## 2021-09-21 ENCOUNTER — Ambulatory Visit (INDEPENDENT_AMBULATORY_CARE_PROVIDER_SITE_OTHER): Payer: Self-pay | Admitting: Internal Medicine

## 2021-09-21 VITALS — BP 123/77 | HR 104 | Temp 98.2°F | Wt 182.0 lb

## 2021-09-21 DIAGNOSIS — M869 Osteomyelitis, unspecified: Secondary | ICD-10-CM

## 2021-09-21 DIAGNOSIS — Z89421 Acquired absence of other right toe(s): Secondary | ICD-10-CM

## 2021-09-21 MED ORDER — HYDROCODONE-ACETAMINOPHEN 5-325 MG PO TABS
1.0000 | ORAL_TABLET | Freq: Four times a day (QID) | ORAL | 0 refills | Status: DC | PRN
Start: 2021-09-21 — End: 2021-10-04

## 2021-09-21 NOTE — Progress Notes (Signed)
Office Visit Note   Patient: David [name]           Date of Birth: 30-Jun-1960           MRN: [id number] Visit Date: 09/21/2021              Requested by: Claiborne Rigg, NP [address] [place],  [place] [address] PCP: Claiborne Rigg, NP  Chief Complaint  Patient presents with   Right Foot - Routine Post Op    09/04/21 4th ray amputation      HPI: Patient is a 61 year old gentleman who is seen 1 week status post right foot fourth ray amputation.  Patient is currently full weightbearing in a postoperative shoe using a cane.  Assessment & Plan: Visit Diagnoses:  1. 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   Ortho Exam  Patient is alert, oriented, no adenopathy, well-dressed, normal affect, normal respiratory effort. Examination the wound is well approximated there is no dehiscence no cellulitis no ischemic changes.  Imaging: No results found. No images are attached to the encounter.  Labs: Lab Results  Component Value Date   HGBA1C 11.2 (H) 08/30/2021   HGBA1C 11.3 (A) 01/08/2021   HGBA1C 9.7 (H) 08/26/2019   REPTSTATUS 09/04/2021 FINAL 08/30/2021   REPTSTATUS 09/04/2021 FINAL 08/30/2021   CULT  08/30/2021    NO GROWTH 5 DAYS Performed at [facility], [address] [place], [place] [address]    CULT  08/30/2021    NO GROWTH 5 DAYS Performed at [facility], [address] [place], [place] [address]    LABORGA METHICILLIN RESISTANT STAPHYLOCOCCUS AUREUS 08/29/2021     Lab Results  Component Value Date   ALBUMIN 3.3 (L) 08/30/2021   ALBUMIN 3.3 (L) 08/29/2021   ALBUMIN 4.1 01/08/2021   PREALBUMIN 16.6 (L) 08/30/2021    Lab Results  Component Value Date   MG 2.0  08/30/2021   MG 1.6 (L) 08/29/2021   MG 1.8 10/19/2018   No results found for: "VD25OH"  Lab Results  Component Value Date   PREALBUMIN 16.6 (L) 08/30/2021      Latest Ref Rng & Units 09/04/2021    4:19 AM 09/03/2021    3:14 AM 09/02/2021    3:29 AM  CBC EXTENDED  WBC 4.0 - 10.5 K/uL 10.4  8.7  8.3   RBC 4.22 - 5.81 MIL/uL 4.39  4.22  4.55   Hemoglobin 13.0 - 17.0 g/dL 99.8  33.8  25.0   HCT 39.0 - 52.0 % 40.2  38.7  41.9   Platelets 150 - 400 K/uL 240  225  210      There is no height or weight on file to calculate BMI.  Orders:  No orders of the defined types were placed in this encounter.  Meds ordered this encounter  Medications   HYDROcodone-acetaminophen (NORCO/VICODIN) 5-325 MG tablet    Sig: Take 1 tablet by mouth every 6 (six) hours as needed for moderate pain.    Dispense:  20 tablet    Refill:  0     Procedures: No procedures performed  Clinical Data: No additional findings.  ROS:  All other systems negative, except as noted in the  HPI. Review of Systems  Objective: Vital Signs: There were no vitals taken for this visit.  Specialty Comments:  No specialty comments available.  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 EF 45% in 2019.   CHF (congestive heart failure) (HCC)    Chronic anticoagulation 09/30/2017   Diabetes mellitus type 2 in nonobese [facility])    Does not have health insurance    Essential hypertension 09/26/2017   Financial difficulties    H/O noncompliance with medical treatment, presenting hazards to health    Non-insulin treated type 2 diabetes mellitus (HCC) 09/26/2017   Persistent atrial fibrillation (HCC)    Sleep apnea suspected 09/30/2017    Family History  Problem Relation Age of Onset   Hypertension Mother     Past Surgical History:  Procedure Laterality Date   ABDOMINAL AORTOGRAM W/LOWER EXTREMITY N/A 09/02/2021   Procedure: ABDOMINAL AORTOGRAM W/LOWER EXTREMITY;  Surgeon: Nada Libman, MD;  Location: [facility] INVASIVE CV LAB;  Service: Cardiovascular;  Laterality: N/A;   AMPUTATION Right 09/04/2021   Procedure: 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;  Surgeon: Nadara Mustard, MD;  [facility];  Service: Orthopedics;  Laterality: Right;   APPENDECTOMY  1971   CARDIOVERSION N/A 09/28/2017   Procedure: CARDIOVERSION;  Surgeon: Thurmon Fair, MD;  Location: [facility] ENDOSCOPY;  Service: Cardiovascular;  Laterality: N/A;   PERIPHERAL VASCULAR BALLOON ANGIOPLASTY  09/02/2021   Procedure: PERIPHERAL VASCULAR BALLOON ANGIOPLASTY;  Surgeon: Nada Libman, MD;  Location: [facility] INVASIVE CV LAB;  Service: Cardiovascular;;   TEE WITHOUT CARDIOVERSION N/A 09/28/2017   Procedure: TRANSESOPHAGEAL ECHOCARDIOGRAM (TEE);  Surgeon: Thurmon Fair, MD;  Location[facility] ENDOSCOPY;  Service: Cardiovascular;  Laterality: N/A;   TEE WITHOUT CARDIOVERSION N/A 09/03/2021   Procedure: TRANSESOPHAGEAL ECHOCARDIOGRAM (TEE);  Surgeon: Chilton Si, MD;  Location: [facility] ENDOSCOPY;  Service: Cardiovascular;  Laterality:  N/A;   Social History   Occupational History   Not on file  Tobacco Use   Smoking status: Former    Years: 15.00    Types: Cigarettes    Quit date: 03/18/2018    Years since quitting: 3.5   Smokeless tobacco: Never   Tobacco comments:    09/26/2017 "2-3 cigarettes/month now"  Vaping Use   Vaping Use: Never used  Substance and Sexual Activity   Alcohol use: Yes    Alcohol/week: 3.0 standard drinks of alcohol    Types: 3 Cans of beer per week   Drug use: Never   Sexual activity: Not Currently

## 2021-09-21 NOTE — Progress Notes (Signed)
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[place]) 10/18/2018   Transaminitis 10/17/2018   Hyperbilirubinemia 10/17/2018   Leukocytosis 10/17/2018   Sinusitis 10/17/2018   Chronic anticoagulation 09/30/2017   Smoker 09/30/2017   Cardiomyopathy ([place]) 09/30/2017   Sleep apnea suspected 09/30/2017   Essential hypertension 09/26/2017   Insulin dependent type 2 diabetes mellitus ([place]) 09/26/2017   Atrial fibrillation with RVR ([place]) 09/26/2017    Patient's Medications  New Prescriptions   No medications on file  Previous Medications   ACETAMINOPHEN (TYLENOL) 500 MG TABLET    Take 500 mg by mouth every 6 (six) hours as needed for moderate pain or headache.   APIXABAN (ELIQUIS) 5 MG TABS TABLET    Take 1 tablet (5 mg total) by mouth 2 (two) times daily.   ASPIRIN EC 81 MG TABLET    Take 1 tablet (81 mg total) by mouth daily. Swallow whole.   ATORVASTATIN (LIPITOR) 80 MG TABLET    Take 1 tablet (80 mg total) by mouth daily.   CARVEDILOL (COREG) 3.125 MG TABLET    Take 1 tablet (3.125 mg total) by mouth 2 (two) times daily with a meal.   CEFTRIAXONE (ROCEPHIN) IVPB    Inject 2 g into the vein daily. Indication:  MRSA bacteremia and Left 4th toe osteomyelitis First Dose: Yes Last Day of Therapy:  10/15/2021 Labs - Once weekly:  CBC/D and BMP, Labs - Every other week:  ESR and CRP Method of administration: IV Push Method of administration may be changed at the discretion of home infusion pharmacist based upon assessment  of the patient and/or caregiver's ability to self-administer the medication ordered.   CETIRIZINE (ZYRTEC) 10 MG TABLET    Take 10 mg by mouth at bedtime as needed for allergies.   CLOPIDOGREL (PLAVIX) 75 MG TABLET    Take 1 tablet (75 mg total) by mouth daily with breakfast.   DAPTOMYCIN (CUBICIN) IVPB    Inject 700 mg into the vein daily. Indication:  MRSA bacteremia and Left 4th toe osteomyelitis First Dose: Yes Last Day of Therapy:  10/15/2021 Labs - Once weekly:  CBC/D, BMP, and CPK Labs - Every other week:  ESR and CRP Method of administration: IV Push Method of administration may be changed at the discretion of home infusion pharmacist based upon assessment of the patient and/or caregiver's ability to self-administer the medication ordered.   DIGOXIN (LANOXIN) 0.125 MG TABLET    Take 1 tablet (125 mcg total) by mouth once daily.   FLUTICASONE (FLONASE) 50 MCG/ACT NASAL SPRAY    Place 2 sprays into both nostrils daily.   FUROSEMIDE (LASIX) 40 MG TABLET    Take 1 tablet (40 mg total) by mouth daily.   HYDROCODONE-ACETAMINOPHEN (NORCO) 10-325 MG TABLET    Take 1 tablet by mouth every 6 (six) hours as needed.   HYDROCODONE-ACETAMINOPHEN (NORCO/VICODIN) 5-325 MG TABLET    Take 1 tablet by mouth every 6 (six) hours as needed for moderate pain.   INSULIN ASPART (NOVOLOG)  100 UNIT/ML FLEXPEN    Inject 15 Units into the skin 3 (three) times daily with meals.   INSULIN GLARGINE (BASAGLAR KWIKPEN) 100 UNIT/ML    Inject 40 Units into the skin daily.   METRONIDAZOLE (FLAGYL) 500 MG TABLET    Take 1 tablet (500 mg total) by mouth 2 (two) times daily.   NAPROXEN SODIUM (ALEVE) 220 MG TABLET    Take 440 mg by mouth daily as needed (pain).   SACUBITRIL-VALSARTAN (ENTRESTO) 49-51 MG    Take 1 tablet by mouth 2 (two) times daily.   SPIRONOLACTONE (ALDACTONE) 25 MG TABLET    Take 1 tablet (25 mg total) by mouth daily.   TRUEPLUS LANCETS 28G MISC    Use as instructed. Check blood glucose level by fingerstick  3 times per day.  Modified Medications   No medications on file  Discontinued Medications   No medications on file    Subjective: 61 year old male with past medical history as below presents for hospital follow-up.  He was admitted to [facility] 5/29 - 6/5 with right fourth toe osteomyelitis.  He had a recent spider bite and fifth digit had worsened with more pain and less movement.  He was started on vancomycin, metronidazole and cefepime found to have MRSA bacteremia during hospitalization.  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  Underwent recanalization of occluded posttibial artery and balloon angioplasty on 6/1.  He underwent amputation of fourth ray foot on 6/3 with Dr. Sharol Given, no cultures obtained.  Discharged ceftriaxone, daptomycin and metronidazole x6 weeks EOT 10/15/2021. Today 09/21/2021: Original bandage on from discharge. Reports more pain at amputation side. Denies fever and chills.   Review of Systems: ROS  Past Medical History:  Diagnosis Date   Cardiomyopathy ([place])    a. EF 45% in 2019.   CHF (congestive heart failure) (HCC)    Chronic anticoagulation 09/30/2017   Diabetes mellitus type 2 in nonobese [facility])    Does not have health insurance    Essential hypertension 09/26/2017   Financial difficulties    H/O noncompliance with medical treatment, presenting hazards to health    Non-insulin treated type 2 diabetes mellitus ([place]) 09/26/2017   Persistent atrial fibrillation (HCC)    Sleep apnea suspected 09/30/2017    Social History   Tobacco Use   Smoking status: Former    Years: 15.00    Types: Cigarettes    Quit date: 03/18/2018    Years since quitting: 3.5   Smokeless tobacco: Never   Tobacco comments:    09/26/2017 "2-3 cigarettes/month now"  Vaping Use   Vaping Use: Never used  Substance Use Topics   Alcohol use: Yes    Alcohol/week: 3.0 standard drinks of alcohol    Types: 3 Cans of  beer per week   Drug use: Never    Family History  Problem Relation Age of Onset   Hypertension Mother     No Known Allergies  Health Maintenance  Topic Date Due   COVID-19 Vaccine (1) Never done   FOOT EXAM  Never done   OPHTHALMOLOGY EXAM  Never done   Hepatitis C Screening  Never done   Zoster Vaccines- Shingrix (1 of 2) Never done   INFLUENZA VACCINE  11/02/2021   HEMOGLOBIN A1C  03/02/2022   TETANUS/TDAP  08/25/2029   HIV Screening  Completed   HPV VACCINES  Aged Out   COLONOSCOPY (Pts 45-11yrs Insurance coverage will need to be confirmed)  Discontinued  Objective:  There were no vitals filed for this visit. There is no height or weight on file to calculate BMI.  Physical Exam Constitutional:      General: He is not in acute distress.    Appearance: He is normal weight. He is not toxic-appearing.  HENT:     Head: Normocephalic and atraumatic.     Right Ear: External ear normal.     Left Ear: External ear normal.     Nose: No congestion or rhinorrhea.     Mouth/Throat:     Mouth: Mucous membranes are moist.     Pharynx: Oropharynx is clear.  Eyes:     Extraocular Movements: Extraocular movements intact.     Conjunctiva/sclera: Conjunctivae normal.     Pupils: Pupils are equal, round, and reactive to light.  Cardiovascular:     Rate and Rhythm: Normal rate and regular rhythm.     Heart sounds: No murmur heard.    No friction rub. No gallop.  Pulmonary:     Effort: Pulmonary effort is normal.     Breath sounds: Normal breath sounds.  Abdominal:     General: Abdomen is flat. Bowel sounds are normal.     Palpations: Abdomen is soft.  Musculoskeletal:        General: No swelling. Normal range of motion.     Cervical back: Normal range of motion and neck supple.  Skin:    General: Skin is warm and dry.  Neurological:     General: No focal deficit present.     Mental Status: He is oriented to person, place, and time.  Psychiatric:        Mood and  Affect: Mood normal.     Lab Results Lab Results  Component Value Date   WBC 10.4 09/04/2021   HGB 14.0 09/04/2021   HCT 40.2 09/04/2021   MCV 91.6 09/04/2021   PLT 240 09/04/2021    Lab Results  Component Value Date   CREATININE 0.96 09/05/2021   BUN 16 09/05/2021   NA 133 (L) 09/05/2021   K 4.4 09/05/2021   CL 103 09/05/2021   CO2 21 (L) 09/05/2021    Lab Results  Component Value Date   ALT 17 08/30/2021   AST 21 08/30/2021   ALKPHOS 87 08/30/2021   BILITOT 1.2 08/30/2021    Lab Results  Component Value Date   CHOL 151 09/03/2021   HDL 23 (L) 09/03/2021   LDLCALC 78 09/03/2021   TRIG 248 (H) 09/03/2021   CHOLHDL 6.6 09/03/2021   No results found for: "LABRPR", "RPRTITER" No results found for: "HIV1RNAQUANT", "HIV1RNAVL", "CD4TABS"  A/P #MRSA bacteremia #Left 4th toe osteomyelitis SP 4ray amputation on 6/3, no Cx #DM-poorly controlled(A1c 11.2 on 08/30/21) -Labs: 09/13/21: wbc 9.8, Scr 1.21, esr 34, CRP 5-stable -TTE and TEE showed no vegetation suring hospitalization -Pt did not want me to unwrap the dressing as he was told to keep it in place until appt with Ortho Appt today. Of not he has his original bandage on from hospital discharge. Report he has  bit more pain at amputation site.  Plan -Continue daptomycin + CTX+ metronidazole EOT 10/15/21 -Follow up ortho reccs from today -please place images in chart, may need follow-up imaging pending evaluation -Follow-up in one month Laurice Record, [place] [facility] 09/21/2021, 9:02 AM

## 2021-09-22 ENCOUNTER — Other Ambulatory Visit: Payer: Self-pay

## 2021-09-22 MED ORDER — CETIRIZINE HCL 10 MG PO TABS
10.0000 mg | ORAL_TABLET | Freq: Every evening | ORAL | 3 refills | Status: DC | PRN
Start: 2021-09-22 — End: 2022-01-05
  Filled 2021-09-22: qty 90, 90d supply, fill #0
  Filled 2021-10-14: qty 90, 90d supply, fill #1

## 2021-09-25 ENCOUNTER — Other Ambulatory Visit: Payer: Self-pay

## 2021-09-25 DIAGNOSIS — E11628 Type 2 diabetes mellitus with other skin complications: Secondary | ICD-10-CM

## 2021-09-27 ENCOUNTER — Other Ambulatory Visit: Payer: Self-pay

## 2021-09-28 ENCOUNTER — Other Ambulatory Visit: Payer: Self-pay

## 2021-09-28 ENCOUNTER — Ambulatory Visit (INDEPENDENT_AMBULATORY_CARE_PROVIDER_SITE_OTHER): Payer: Self-pay | Admitting: Orthopedic Surgery

## 2021-09-28 DIAGNOSIS — Z89421 Acquired absence of other right toe(s): Secondary | ICD-10-CM

## 2021-10-01 ENCOUNTER — Encounter: Payer: Self-pay | Admitting: Orthopedic Surgery

## 2021-10-01 NOTE — Progress Notes (Signed)
Office Visit Note   Patient: David [name]           Date of Birth: 10/05/1960           MRN: [id number] Visit Date: 09/28/2021              Requested by: Claiborne Rigg, NP [address] [place],  [place] [address] PCP: Claiborne Rigg, NP  Chief Complaint  Patient presents with   Right Foot - Routine Post Op    09/07/21 right foot 4th ray amputation       HPI: Patient is a 61 year old gentleman who is 3 weeks status post fourth ray amputation.  Assessment & Plan: Visit Diagnoses:  1. 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  Possible return to work in 3 weeks.  Follow-Up Instructions: Return in about 1 week (around 10/05/2021).   Ortho Exam  Patient is alert, oriented, no adenopathy, well-dressed, normal affect, normal respiratory effort. Examination the incision is healing nicely there is small maceration distally.  The incision is well approximated.  Imaging: No results found. No images are attached to the encounter.  Labs: Lab Results  Component Value Date   HGBA1C 11.2 (H) 08/30/2021   HGBA1C 11.3 (A) 01/08/2021   HGBA1C 9.7 (H) 08/26/2019   REPTSTATUS 09/04/2021 FINAL 08/30/2021   REPTSTATUS 09/04/2021 FINAL 08/30/2021   CULT  08/30/2021    NO GROWTH 5 DAYS Performed at [facility], [address] [place], [place] [address]    CULT  08/30/2021    NO GROWTH 5 DAYS Performed at [facility], [address], [place], [place] [address]    LABORGA METHICILLIN RESISTANT STAPHYLOCOCCUS AUREUS 08/29/2021     Lab Results  Component Value Date   ALBUMIN 3.3 (L) 08/30/2021   ALBUMIN 3.3 (L) 08/29/2021   ALBUMIN 4.1 01/08/2021   PREALBUMIN 16.6 (L) 08/30/2021    Lab Results  Component Value Date   MG 2.0 08/30/2021   MG 1.6 (L) 08/29/2021   MG 1.8 10/19/2018   No results found for: "VD25OH"  Lab Results  Component Value Date   PREALBUMIN 16.6  (L) 08/30/2021      Latest Ref Rng & Units 09/04/2021    4:19 AM 09/03/2021    3:14 AM 09/02/2021    3:29 AM  CBC EXTENDED  WBC 4.0 - 10.5 K/uL 10.4  8.7  8.3   RBC 4.22 - 5.81 MIL/uL 4.39  4.22  4.55   Hemoglobin 13.0 - 17.0 g/dL 40.1  02.7  25.3   HCT 39.0 - 52.0 % 40.2  38.7  41.9   Platelets 150 - 400 K/uL 240  225  210      There is no height or weight on file to calculate BMI.  Orders:  No orders of the defined types were placed in this encounter.  No orders of the defined types were placed in this encounter.    Procedures: No procedures performed  Clinical Data: No additional findings.  ROS:  All other systems negative, except as noted in the HPI. Review of Systems  Objective: Vital Signs: There were no vitals taken for this visit.  Specialty Comments:  No specialty comments available.  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)    Gangrene of toe  of right foot (HCC)    Sepsis (HCC) 08/29/2021   SIRS (systemic inflammatory response syndrome) (HCC) 08/29/2021   Hyponatremia 08/29/2021   Hypomagnesemia 08/29/2021   Cellulitis in diabetic foot (HCC) 08/29/2021   Pure hypercholesterolemia 06/17/2021   Cellulitis 08/26/2019   Persistent atrial fibrillation (HCC) 08/26/2019   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) 10/18/2018   Transaminitis 10/17/2018   Hyperbilirubinemia 10/17/2018   Leukocytosis 10/17/2018   Sinusitis 10/17/2018   Chronic anticoagulation 09/30/2017   Smoker 09/30/2017   Cardiomyopathy (HCC) 09/30/2017   Sleep apnea suspected 09/30/2017   Essential hypertension 09/26/2017   Insulin dependent type 2 diabetes mellitus (HCC) 09/26/2017   Atrial fibrillation with RVR (HCC) 09/26/2017   Past Medical History:  Diagnosis Date   Cardiomyopathy (HCC)    a. EF 45% in 2019.   CHF (congestive heart failure) (HCC)    Chronic anticoagulation 09/30/2017    Diabetes mellitus type 2 in nonobese [facility])    Does not have health insurance    Essential hypertension 09/26/2017   Financial difficulties    H/O noncompliance with medical treatment, presenting hazards to health    Non-insulin treated type 2 diabetes mellitus (HCC) 09/26/2017   Persistent atrial fibrillation (HCC)    Sleep apnea suspected 09/30/2017    Family History  Problem Relation Age of Onset   Hypertension Mother     Past Surgical History:  Procedure Laterality Date   ABDOMINAL AORTOGRAM W/LOWER EXTREMITY N/A 09/02/2021   Procedure: ABDOMINAL AORTOGRAM W/LOWER EXTREMITY;  Surgeon: Nada Libman, MD;  Location: [facility] INVASIVE CV LAB;  Service: Cardiovascular;  Laterality: N/A;   AMPUTATION Right 09/04/2021   Procedure: 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;  Surgeon: Nadara Mustard, MD;  Location: [facility] OR;  Service: Orthopedics;  Laterality: Right;   APPENDECTOMY  1971   CARDIOVERSION N/A 09/28/2017   Procedure: CARDIOVERSION;  Surgeon: Thurmon Fair, MD;  Location: [facility] ENDOSCOPY;  Service: Cardiovascular;  Laterality: N/A;   PERIPHERAL VASCULAR BALLOON ANGIOPLASTY  09/02/2021   Procedure: PERIPHERAL VASCULAR BALLOON ANGIOPLASTY;  Surgeon: Nada Libman, MD;  Location: [facility] INVASIVE CV LAB;  Service: Cardiovascular;;   TEE WITHOUT CARDIOVERSION N/A 09/28/2017   Procedure: TRANSESOPHAGEAL ECHOCARDIOGRAM (TEE);  Surgeon: Thurmon Fair, MD;  Location: [facility] ENDOSCOPY;  Service: Cardiovascular;  Laterality: N/A;   TEE WITHOUT CARDIOVERSION N/A 09/03/2021   Procedure: TRANSESOPHAGEAL ECHOCARDIOGRAM (TEE);  Surgeon: Chilton Si, MD;  Location: [facility] ENDOSCOPY;  Service: Cardiovascular;  Laterality: N/A;   Social History   Occupational History   Not on file  Tobacco Use   Smoking status: Former    Years: 15.00    Types: Cigarettes    Quit date: 03/18/2018    Years since quitting: 3.5   Smokeless tobacco: Never   Tobacco comments:    09/26/2017 "2-3 cigarettes/month now"  Vaping Use   Vaping  Use: Never used  Substance and Sexual Activity   Alcohol use: Yes    Alcohol/week: 3.0 standard drinks of alcohol    Types: 3 Cans of beer per week   Drug use: Never   Sexual activity: Not Currently

## 2021-10-04 ENCOUNTER — Other Ambulatory Visit: Payer: Self-pay | Admitting: Orthopedic Surgery

## 2021-10-04 ENCOUNTER — Other Ambulatory Visit: Payer: Self-pay

## 2021-10-04 ENCOUNTER — Other Ambulatory Visit: Payer: Self-pay | Admitting: Nurse Practitioner

## 2021-10-04 DIAGNOSIS — I429 Cardiomyopathy, unspecified: Secondary | ICD-10-CM

## 2021-10-04 DIAGNOSIS — I480 Paroxysmal atrial fibrillation: Secondary | ICD-10-CM

## 2021-10-04 MED ORDER — HYDROCODONE-ACETAMINOPHEN 5-325 MG PO TABS: 1.0000 | ORAL_TABLET | Freq: Four times a day (QID) | ORAL | 0 refills | Status: DC | PRN

## 2021-10-04 NOTE — Progress Notes (Unsigned)
Office Visit    Patient Name: David [name] Date of Encounter: 10/06/2021  PCP:  Claiborne Rigg, NP   [facility]  Cardiologist:  Charlton Haws, MD  Advanced Practice Provider:  No care team member to display Electrophysiologist:  None   HPI    David [name] is a 61 y.o. male with a hx of diabetes mellitus, persistent atrial fibrillation (failed DCCV 09/2017 and attempted sotalol with plan to repeat DCCV; DCCV not done and patient stopped meds), HFrEF (Myoview in 2019 with mild anterior lateral ischemia), suspected OSA, hypertension presents today for follow-up appointment.    He was admitted 10/2018 with AF with RVR and low EF.  The plan at that time was to attempt rate control and repeat echocardiogram 3 months later to check for improvement.  If his EF did not improve, he would need ischemic eval.  He was lost to follow-up at this time.  He was seen in the clinic in 3/22 and was supposed to return in 3 months.  He returned to the office 3/23 for follow-up visit with Tereso Newcomer, PA-C.  He was complaining of fatigue and lethargy for about a year.  He had some shortness of breath when he climbs stairs but no chest pain, syncope, orthopnea, leg edema.  He does not smoke.  Today, he recently had his toe amputated by Dr. [name] due to diabetes.  He states that his toe was infected and it spread his blood.  He had MRSA in his blood and is now on IV antibiotics for 6 weeks through a PICC line.  He states that he has not been very active over the past few weeks because of his toe.  He is a Investment banker, operational for work and hopes that Dr. [name] will clear him on Friday to go back to work.  Due to all this going on, this was why he was never scheduled for his [employer].  I asked the patient to asked Dr. [name] when it would be appropriate to move forward with this test given his current condition.  Before his toe amputation, he admits to not taking his insulin like he should and not eating like he  should.  Recent A1c was over 11.  He has however lost 60 pounds over the last year and has tried to make healthier choices lately.  Once he recovers from his toe surgery he plans to be more active.  He had a new onset diagnosis of atrial fibrillation with a failed cardioversion.  He was supposed to see EP and the appointment was never scheduled.  We will plan for him to follow-up with EP today.  He may be an ablation candidate.  He was also recently started on a statin due to his cholesterol levels when he was in the hospital.  We have arranged for him to get follow-up labs in 8 weeks to check a lipid panel.  He does have some shortness of breath with exertion but this could also be due to deconditioning.  Echocardiogram from his hospitalization reviewed with the patient.  Reports no chest pain, pressure, or tightness. No edema, orthopnea, PND. Reports no palpitations.    Past Medical History    Past Medical History:  Diagnosis Date   Cardiomyopathy (HCC)    a. EF 45% in 2019.   CHF (congestive heart failure) (HCC)    Chronic anticoagulation 09/30/2017   Diabetes mellitus type 2 in nonobese [facility])    Does not have health  insurance    Essential hypertension 09/26/2017   Financial difficulties    H/O noncompliance with medical treatment, presenting hazards to health    Non-insulin treated type 2 diabetes mellitus ([place]) 09/26/2017   Persistent atrial fibrillation ([place])    Sleep apnea suspected 09/30/2017   Past Surgical History:  Procedure Laterality Date   ABDOMINAL AORTOGRAM W/LOWER EXTREMITY N/A 09/02/2021   Procedure: ABDOMINAL AORTOGRAM W/LOWER EXTREMITY;  Surgeon: Serafina Mitchell, MD;  Location: [place] CV LAB;  Service: Cardiovascular;  Laterality: N/A;   AMPUTATION Right 09/04/2021   Procedure: 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;  Surgeon: Newt Minion, MD;  Location: [place];  Service: Orthopedics;  Laterality: Right;   APPENDECTOMY  1971   CARDIOVERSION N/A 09/28/2017   Procedure:  CARDIOVERSION;  Surgeon: Sanda Klein, MD;  Location: [place];  Service: Cardiovascular;  Laterality: N/A;   PERIPHERAL VASCULAR BALLOON ANGIOPLASTY  09/02/2021   Procedure: PERIPHERAL VASCULAR BALLOON ANGIOPLASTY;  Surgeon: Serafina Mitchell, MD;  Location: [place] CV LAB;  Service: Cardiovascular;;   TEE WITHOUT CARDIOVERSION N/A 09/28/2017   Procedure: TRANSESOPHAGEAL ECHOCARDIOGRAM (TEE);  Surgeon: Sanda Klein, MD;  Location: [place];  Service: Cardiovascular;  Laterality: N/A;   TEE WITHOUT CARDIOVERSION N/A 09/03/2021   Procedure: TRANSESOPHAGEAL ECHOCARDIOGRAM (TEE);  Surgeon: Skeet Latch, MD;  Location: [place];  Service: Cardiovascular;  Laterality: N/A;    Allergies  No Known Allergies   EKGs/Labs/Other Studies Reviewed:   The following studies were reviewed today:  09/03/2021 echocardiogram  IMPRESSIONS     1. Left ventricular ejection fraction, by estimation, is 60 to 65%. The  left ventricle has normal function. The left ventricle has no regional  wall motion abnormalities.   2. Right ventricular systolic function is normal. The right ventricular  size is normal.   3. No left atrial/left atrial appendage thrombus was detected.   4. The mitral valve is normal in structure. Trivial mitral valve  regurgitation. No evidence of mitral stenosis.   5. The aortic valve is tricuspid. Aortic valve regurgitation is not  visualized. No aortic stenosis is present.   6. The inferior vena cava is normal in size with greater than 50%  respiratory variability, suggesting right atrial pressure of 3 mmHg.   Conclusion(s)/Recommendation(s): Normal biventricular function without  evidence of hemodynamically significant valvular heart disease.   Echocardiogram 10/17/2018 EF 35-40, moderate LVH, moderately reduced RVSF, mild to moderate MR, RVSP 36.3   Myoview 09/29/2017 Mild mid anterolateral reversibility-equivocal finding, EF 44; intermediate risk Reviewed  by Dr. Johnsie Cancel - no infarct; ?mild ant-lat ischemia >> focus on Rx of AFib at that time  EKG:  EKG is not ordered today.    Recent Labs: 06/16/2021: NT-Pro BNP 468 08/30/2021: ALT 17; Magnesium 2.0; TSH 0.837 09/04/2021: Hemoglobin 14.0; Platelets 240 09/05/2021: BUN 16; Creatinine, Ser 0.96; Potassium 4.4; Sodium 133  Recent Lipid Panel    Component Value Date/Time   CHOL 151 09/03/2021 0314   CHOL 233 (H) 01/08/2021 1643   TRIG 248 (H) 09/03/2021 0314   HDL 23 (L) 09/03/2021 0314   HDL 38 (L) 01/08/2021 1643   CHOLHDL 6.6 09/03/2021 0314   VLDL 50 (H) 09/03/2021 0314   LDLCALC 78 09/03/2021 0314   LDLCALC 122 (H) 01/08/2021 1643    Risk Assessment/Calculations:   CHA2DS2-VASc Score = 3   This indicates a 3.2% annual risk of stroke. The patient's score is based upon: CHF History: 1 HTN History: 1 Diabetes History: 1 Stroke History: 0 Vascular Disease History:  0 Age Score: 0 Gender Score: 0     Home Medications   Current Meds  Medication Sig   apixaban (ELIQUIS) 5 MG TABS tablet Take 1 tablet (5 mg total) by mouth 2 (two) times daily.   aspirin EC 81 MG tablet Take 1 tablet (81 mg total) by mouth daily. Swallow whole.   atorvastatin (LIPITOR) 80 MG tablet Take 1 tablet (80 mg total) by mouth daily.   carvedilol (COREG) 3.125 MG tablet Take 1 tablet (3.125 mg total) by mouth 2 (two) times daily with a meal.   cefTRIAXone (ROCEPHIN) IVPB Inject 2 g into the vein daily. Indication:  MRSA bacteremia and Left 4th toe osteomyelitis First Dose: Yes Last Day of Therapy:  10/15/2021 Labs - Once weekly:  CBC/D and BMP, Labs - Every other week:  ESR and CRP Method of administration: IV Push Method of administration may be changed at the discretion of home infusion pharmacist based upon assessment of the patient and/or caregiver's ability to self-administer the medication ordered.   cetirizine (ZYRTEC) 10 MG tablet Take 1 tablet (10 mg total) by mouth at bedtime as needed for  allergies.   clopidogrel (PLAVIX) 75 MG tablet Take 1 tablet (75 mg total) by mouth daily with breakfast.   daptomycin (CUBICIN) IVPB Inject 700 mg into the vein daily. Indication:  MRSA bacteremia and Left 4th toe osteomyelitis First Dose: Yes Last Day of Therapy:  10/15/2021 Labs - Once weekly:  CBC/D, BMP, and CPK Labs - Every other week:  ESR and CRP Method of administration: IV Push Method of administration may be changed at the discretion of home infusion pharmacist based upon assessment of the patient and/or caregiver's ability to self-administer the medication ordered.   digoxin (LANOXIN) 0.125 MG tablet Take 1 tablet (125 mcg total) by mouth once daily.   fluticasone (FLONASE) 50 MCG/ACT nasal spray Place 2 sprays into both nostrils daily.   furosemide (LASIX) 40 MG tablet Take 1 tablet (40 mg total) by mouth daily.   HYDROcodone-acetaminophen (NORCO) 10-325 MG tablet Take 1 tablet by mouth every 6 (six) hours as needed.   insulin aspart (NOVOLOG) 100 UNIT/ML FlexPen Inject 15 Units into the skin 3 (three) times daily with meals.   Insulin Glargine (BASAGLAR KWIKPEN) 100 UNIT/ML Inject 40 Units into the skin daily.   metroNIDAZOLE (FLAGYL) 500 MG tablet Take 1 tablet (500 mg total) by mouth 2 (two) times daily.   sacubitril-valsartan (ENTRESTO) 49-51 MG Take 1 tablet by mouth 2 (two) times daily.   spironolactone (ALDACTONE) 25 MG tablet Take 1 tablet (25 mg total) by mouth daily.     Review of Systems      All other systems reviewed and are otherwise negative except as noted above.  Physical Exam    VS:  BP 127/84   Pulse 88   Ht 6' (1.829 m)   Wt 182 lb (82.6 kg)   BMI 24.68 kg/m  , BMI Body mass index is 24.68 kg/m.  Wt Readings from Last 3 Encounters:  10/06/21 182 lb (82.6 kg)  09/21/21 182 lb (82.6 kg)  09/05/21 188 lb 0.8 oz (85.3 kg)     GEN: Well nourished, well developed, in no acute distress. HEENT: normal. Neck: Supple, no JVD, carotid bruits, or  masses. Cardiac: Irregularly irregular, no murmurs, rubs, or gallops. No clubbing, cyanosis, edema.  Radials/PT 2+ and equal bilaterally.  Respiratory:  Respirations regular and unlabored, clear to auscultation bilaterally. GI: Soft, nontender, nondistended. MS: No deformity or atrophy.  Skin: Warm and dry, no rash. Neuro:  Strength and sensation are intact. Psych: Normal affect.  Assessment & Plan    HFrEF -Reported poor energy and lethargy-was initially supposed to have a Lexiscan Myoview but was never done due to infected toe -Continue Coreg 25 mg twice a day, digoxin 0.125 mg once a day, Lasix 40 mg once a day, spironolactone 12.5 mg once daily, and Entresto 49/51 mg twice daily -Consider SGLT2 inhibitors once diabetes is better controlled.  Recent A1c 11.2 -Echocardiogram from 09/03/2021 with LVEF 60 to 65%, no significant valvular abnormality, no regional wall motion abnormalities. -Does not smoke and does not drink  2. Persistent atrial fibrillation -We will set up an appointment with EP today -DCCV didn't work -Ablation vs. Another DCCV  3. Essential HTN -since 61 years old -Well-controlled today -Continue current medication regimen  4. Insulin-dependent type 2 DM -Working with PCP to get better control of his sugars -A1C 11.2 as of 08/30/21   5. Hypercholesterolemia -Lipid panel in 6-8 weeks -LDL 78, HDL 23, cholesterol 151 -Triglycerides 248, goal less than 150    Disposition: Follow up 2 months with Jenkins Rouge, MD or APP.  Signed, Elgie Collard, PA-C 10/06/2021, 2:51 PM [facility]

## 2021-10-06 ENCOUNTER — Other Ambulatory Visit: Payer: Self-pay

## 2021-10-06 ENCOUNTER — Encounter: Payer: Self-pay | Admitting: Family

## 2021-10-06 ENCOUNTER — Encounter: Payer: Self-pay | Admitting: Physician Assistant

## 2021-10-06 ENCOUNTER — Ambulatory Visit (INDEPENDENT_AMBULATORY_CARE_PROVIDER_SITE_OTHER): Payer: Self-pay | Admitting: Physician Assistant

## 2021-10-06 VITALS — BP 127/84 | HR 88 | Ht 72.0 in | Wt 182.0 lb

## 2021-10-06 DIAGNOSIS — I5020 Unspecified systolic (congestive) heart failure: Secondary | ICD-10-CM

## 2021-10-06 DIAGNOSIS — R0602 Shortness of breath: Secondary | ICD-10-CM

## 2021-10-06 DIAGNOSIS — Z79899 Other long term (current) drug therapy: Secondary | ICD-10-CM

## 2021-10-06 DIAGNOSIS — I5022 Chronic systolic (congestive) heart failure: Secondary | ICD-10-CM

## 2021-10-06 DIAGNOSIS — I4819 Other persistent atrial fibrillation: Secondary | ICD-10-CM

## 2021-10-06 DIAGNOSIS — E7800 Pure hypercholesterolemia, unspecified: Secondary | ICD-10-CM

## 2021-10-06 NOTE — Patient Instructions (Addendum)
Medication Instructions:  Your physician recommends that you continue on your current medications as directed. Please refer to the Current Medication list given to you today.  *If you need a refill on your cardiac medications before your next appointment, please call your pharmacy*   Lab Work: Fasting lipids and A1C in 6-8 weeks prior to 2 month follow up appointment If you have labs (blood work) drawn today and your tests are completely normal, you will receive your results only by: MyChart Message (if you have MyChart) OR A paper copy in the mail If you have any lab test that is abnormal or we need to change your treatment, we will call you to review the results.   Follow-Up: At [facility], you and your health needs are our priority.  As part of our continuing mission to provide you with exceptional heart care, we have created designated Provider Care Teams.  These Care Teams include your primary Cardiologist (physician) and Advanced Practice Providers (APPs -  Physician Assistants and Nurse Practitioners) who all work together to provide you with the care you need, when you need it.   Your next appointment:   2 month(s) with labs prior  The format for your next appointment:   In Person  Provider:   Charlton Haws, MD  or APP  Other Instructions You have been referred to electrophysiology (EP) to discuss ablation vs cardioversion.  Important Information About Sugar

## 2021-10-08 ENCOUNTER — Ambulatory Visit (INDEPENDENT_AMBULATORY_CARE_PROVIDER_SITE_OTHER): Payer: Self-pay | Admitting: Family

## 2021-10-08 ENCOUNTER — Inpatient Hospital Stay: Payer: Self-pay | Admitting: Nurse Practitioner

## 2021-10-08 ENCOUNTER — Encounter: Payer: Self-pay | Admitting: Family

## 2021-10-08 DIAGNOSIS — Z89421 Acquired absence of other right toe(s): Secondary | ICD-10-CM

## 2021-10-08 NOTE — Progress Notes (Signed)
Post-Op Visit Note   Patient: David [name]           Date of Birth: 01-Mar-1961           MRN: [id number] Visit Date: 10/08/2021 PCP: Claiborne Rigg, NP  Chief Complaint:  Chief Complaint  Patient presents with   Right Foot - Routine Post Op    09/07/21 right foot 4th ray amputation    HPI:  HPI Patient is a 61 year old gentleman seen status post right fourth ray amputation sutures are in place he wonders when he can return to work he is continues in a postop shoe Ortho Exam On examination of the right foot his incision is approximated and healing this is not yet fully healed sutures harvested today there is no gaping no active drainage no surrounding maceration  Visit Diagnoses: No diagnosis found.  Plan: Continue to minimize weightbearing he states he will go back part-time in 1 week from now continue dry dressings continue postop shoe  Follow-Up Instructions: No follow-ups on file.   Imaging: No results found.  Orders:  No orders of the defined types were placed in this encounter.  No orders of the defined types were placed in this encounter.    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)    Gangrene of toe of right foot (HCC)    Sepsis (HCC) 08/29/2021   SIRS (systemic inflammatory response syndrome) (HCC) 08/29/2021   Hyponatremia 08/29/2021   Hypomagnesemia 08/29/2021   Cellulitis in diabetic foot (HCC) 08/29/2021   Pure hypercholesterolemia 06/17/2021   Cellulitis 08/26/2019   Persistent atrial fibrillation (HCC) 08/26/2019   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) 10/18/2018   Transaminitis 10/17/2018   Hyperbilirubinemia 10/17/2018   Leukocytosis 10/17/2018   Sinusitis 10/17/2018   Chronic anticoagulation 09/30/2017   Smoker 09/30/2017   Cardiomyopathy (HCC) 09/30/2017   Sleep apnea suspected 09/30/2017   Essential hypertension 09/26/2017   Insulin dependent  type 2 diabetes mellitus (HCC) 09/26/2017   Atrial fibrillation with RVR (HCC) 09/26/2017   Past Medical History:  Diagnosis Date   Cardiomyopathy (HCC)    a. EF 45% in 2019.   CHF (congestive heart failure) (HCC)    Chronic anticoagulation 09/30/2017   Diabetes mellitus type 2 in nonobese [facility])    Does not have health insurance    Essential hypertension 09/26/2017   Financial difficulties    H/O noncompliance with medical treatment, presenting hazards to health    Non-insulin treated type 2 diabetes mellitus (HCC) 09/26/2017   Persistent atrial fibrillation (HCC)    Sleep apnea suspected 09/30/2017    Family History  Problem Relation Age of Onset   Hypertension Mother     Past Surgical History:  Procedure Laterality Date   ABDOMINAL AORTOGRAM W/LOWER EXTREMITY N/A 09/02/2021   Procedure: ABDOMINAL AORTOGRAM W/LOWER EXTREMITY;  Surgeon: Nada Libman, MD;  Location: [facility];  Service: Cardiovascular;  Laterality: N/A;   AMPUTATION Right 09/04/2021   Procedure: 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;  Surgeon: Nadara Mustard, MD;  Location: [facility] OR;  Service: Orthopedics;  Laterality: Right;   APPENDECTOMY  1971   CARDIOVERSION N/A 09/28/2017   Procedure: CARDIOVERSION;  Surgeon: Thurmon Fair, MD;  Location: [facility] ENDOSCOPY;  Service: Cardiovascular;  Laterality: N/A;   PERIPHERAL VASCULAR BALLOON ANGIOPLASTY  09/02/2021   Procedure: PERIPHERAL VASCULAR BALLOON ANGIOPLASTY;  Surgeon: Nada Libman, MD;  Location: [facility];  Service: Cardiovascular;;   TEE WITHOUT CARDIOVERSION N/A 09/28/2017   Procedure: TRANSESOPHAGEAL ECHOCARDIOGRAM (TEE);  Surgeon: Thurmon Fair, MD;  Location[facility] ENDOSCOPY;  Service: Cardiovascular;  Laterality: N/A;   TEE WITHOUT CARDIOVERSION N/A 09/03/2021   Procedure: TRANSESOPHAGEAL ECHOCARDIOGRAM (TEE);  Surgeon: Chilton Si, MD;  Location: [facility] ENDOSCOPY;  Service: Cardiovascular;  Laterality: N/A;   Social History   Occupational History    Not on file  Tobacco Use   Smoking status: Former    Years: 15.00    Types: Cigarettes    Quit date: 03/18/2018    Years since quitting: 3.5   Smokeless tobacco: Never   Tobacco comments:    09/26/2017 "2-3 cigarettes/month now"  Vaping Use   Vaping Use: Never used  Substance and Sexual Activity   Alcohol use: Yes    Alcohol/week: 3.0 standard drinks of alcohol    Types: 3 Cans of beer per week   Drug use: Never   Sexual activity: Not Currently

## 2021-10-09 ENCOUNTER — Other Ambulatory Visit: Payer: Self-pay | Admitting: Nurse Practitioner

## 2021-10-09 DIAGNOSIS — I480 Paroxysmal atrial fibrillation: Secondary | ICD-10-CM

## 2021-10-09 DIAGNOSIS — I429 Cardiomyopathy, unspecified: Secondary | ICD-10-CM

## 2021-10-11 ENCOUNTER — Other Ambulatory Visit: Payer: Self-pay

## 2021-10-14 ENCOUNTER — Encounter (HOSPITAL_COMMUNITY): Payer: Self-pay

## 2021-10-14 ENCOUNTER — Other Ambulatory Visit: Payer: Self-pay

## 2021-10-14 ENCOUNTER — Other Ambulatory Visit: Payer: Self-pay | Admitting: Physician Assistant

## 2021-10-14 ENCOUNTER — Other Ambulatory Visit: Payer: Self-pay | Admitting: Nurse Practitioner

## 2021-10-14 DIAGNOSIS — I480 Paroxysmal atrial fibrillation: Secondary | ICD-10-CM

## 2021-10-14 DIAGNOSIS — I429 Cardiomyopathy, unspecified: Secondary | ICD-10-CM

## 2021-10-14 NOTE — Telephone Encounter (Signed)
Pt saw Jari Favre, PA-C on July 5. Ok to refill. Tereso Newcomer, PA-C    10/14/2021 5:03 PM

## 2021-10-15 ENCOUNTER — Other Ambulatory Visit: Payer: Self-pay

## 2021-10-15 MED ORDER — DIGOXIN 125 MCG PO TABS
125.0000 ug | ORAL_TABLET | Freq: Every day | ORAL | 0 refills | Status: DC
  Filled 2021-10-15: qty 30, 30d supply, fill #0

## 2021-10-15 MED ORDER — DIGOXIN 125 MCG PO TABS
125.0000 ug | ORAL_TABLET | Freq: Every day | ORAL | 6 refills | Status: DC
  Filled 2021-10-22: qty 30, 30d supply, fill #0
  Filled 2021-12-07: qty 30, 30d supply, fill #1
  Filled 2022-01-17: qty 30, 30d supply, fill #2
  Filled 2022-02-15: qty 30, 30d supply, fill #3
  Filled 2022-03-18 (×2): qty 30, 30d supply, fill #4
  Filled 2022-04-13 (×2): qty 30, 30d supply, fill #5
  Filled 2022-05-16: qty 30, 30d supply, fill #6
  Filled ????-??-??: fill #3

## 2021-10-15 NOTE — Addendum Note (Signed)
Addended byAlben Spittle, Lorin Picket T on: 10/15/2021 12:34 PM   Modules accepted: Orders

## 2021-10-18 ENCOUNTER — Ambulatory Visit (INDEPENDENT_AMBULATORY_CARE_PROVIDER_SITE_OTHER): Payer: Self-pay | Admitting: Physician Assistant

## 2021-10-18 ENCOUNTER — Other Ambulatory Visit: Payer: Self-pay

## 2021-10-18 ENCOUNTER — Encounter: Payer: Self-pay | Admitting: Physician Assistant

## 2021-10-18 ENCOUNTER — Ambulatory Visit (INDEPENDENT_AMBULATORY_CARE_PROVIDER_SITE_OTHER)
Admit: 2021-10-18 | Discharge: 2021-10-18 | Disposition: A | Discharge status: Home / Self Care | Payer: Self-pay | Attending: Surgery | Admitting: Surgery

## 2021-10-18 ENCOUNTER — Telehealth: Payer: Self-pay | Admitting: Pharmacist

## 2021-10-18 ENCOUNTER — Ambulatory Visit (HOSPITAL_COMMUNITY)
Admission: RE | Admit: 2021-10-18 | Discharge: 2021-10-18 | Disposition: A | Discharge status: Home / Self Care | Payer: Self-pay | Source: Ambulatory Visit | Attending: Surgery | Admitting: Surgery

## 2021-10-18 VITALS — BP 105/79 | HR 100 | Temp 97.9°F | Resp 20 | Ht 72.0 in | Wt 180.6 lb

## 2021-10-18 DIAGNOSIS — L03119 Cellulitis of unspecified part of limb: Secondary | ICD-10-CM

## 2021-10-18 DIAGNOSIS — E11628 Type 2 diabetes mellitus with other skin complications: Secondary | ICD-10-CM

## 2021-10-18 DIAGNOSIS — I739 Peripheral vascular disease, unspecified: Secondary | ICD-10-CM

## 2021-10-18 NOTE — Telephone Encounter (Addendum)
Fayrene Fearing from home health agency called requesting pull PICC orders since patient's IV abx stopped today. Gave verbal per Dr. Doristine Church last note to go ahead and pul PICC. Patient has a follow up scheduled on Friday 7/21.   Finian Helvey L. Roshawna Colclasure, PharmD, BCIDP, AAHIVP, CPP Clinical Pharmacist Practitioner Infectious Diseases Clinical Pharmacist [facility] 10/18/2021, 12:25 PM

## 2021-10-18 NOTE — Progress Notes (Signed)
Established PAD   History of Present Illness   David [name] is a 61 y.o. (1960/10/23) male who is here for follow-up after aortogram with right posterior tibial artery angioplasty on 09/02/2021 by Dr. Trula Slade.  The patient had limb-threatening ischemia to the right leg with an absent right toe pressure.  He underwent this procedure in hopes to heal a right fourth ray amputation by Dr. Sharol Given on 09/04/2021.  The patient was found to only have peroneal runoff in his right foot and reconstituted PT at the ankle.  At today's follow-up, his surgical sutures on the foot have been removed.  The patient states when the sutures were removed on 09/28/2021 the foot was healing well.  He was told to increase weightbearing and use dry dressings on the foot.  The patient states that his incision site opened shortly after the sutures were removed and started soaking the gauze bandage with blood and colorless discharge. He denies any yellow or green discharge from the area, or any erythema surrounding the wound.  He believes that the wound has now been closing slowly over the past couple of days.  He changes his dry gauze dressing 1 time daily.  He denies any fevers.  He has still been able to weight-bear, however with some pain.  He hopes to go back to work.  His next follow-up with Dr. Sharol Given is this Friday, 10/22/2021.  The patient endorses intermittent claudication of the L > R lower legs after walking long distances.  He denies rest pain or nonhealing wounds.  He states the claudication of his right leg has improved since the procedure.   Current Outpatient Medications  Medication Sig Dispense Refill   apixaban (ELIQUIS) 5 MG TABS tablet Take 1 tablet (5 mg total) by mouth 2 (two) times daily. 180 tablet 1   aspirin EC 81 MG tablet Take 1 tablet (81 mg total) by mouth daily. Swallow whole. 30 tablet 1   atorvastatin (LIPITOR) 80 MG tablet Take 1 tablet (80 mg total) by mouth daily. 30 tablet 1   carvedilol (COREG)  3.125 MG tablet Take 1 tablet (3.125 mg total) by mouth 2 (two) times daily with a meal. 60 tablet 1   cetirizine (ZYRTEC) 10 MG tablet Take 1 tablet (10 mg total) by mouth at bedtime as needed for allergies. 90 tablet 3   clopidogrel (PLAVIX) 75 MG tablet Take 1 tablet (75 mg total) by mouth daily with breakfast. 30 tablet 1   digoxin (LANOXIN) 0.125 MG tablet Take 1 tablet (125 mcg total) by mouth once daily. 30 tablet 6   fluticasone (FLONASE) 50 MCG/ACT nasal spray Place 2 sprays into both nostrils daily. 16 g 6   furosemide (LASIX) 40 MG tablet Take 1 tablet (40 mg total) by mouth daily. 30 tablet 1   insulin aspart (NOVOLOG) 100 UNIT/ML FlexPen Inject 15 Units into the skin 3 (three) times daily with meals. 15 mL 0   Insulin Glargine (BASAGLAR KWIKPEN) 100 UNIT/ML Inject 40 Units into the skin daily. 15 mL 1   metroNIDAZOLE (FLAGYL) 500 MG tablet Take 1 tablet (500 mg total) by mouth 2 (two) times daily. 78 tablet 0   sacubitril-valsartan (ENTRESTO) 49-51 MG Take 1 tablet by mouth 2 (two) times daily. 180 tablet 0   spironolactone (ALDACTONE) 25 MG tablet Take 1 tablet (25 mg total) by mouth daily. 90 tablet 3   No current facility-administered medications for this visit.    REVIEW OF SYSTEMS (negative unless checked):  Cardiac:  []  Chest pain or chest pressure? []  Shortness of breath upon activity? []  Shortness of breath when lying flat? []  Irregular heart rhythm?  Vascular:  [x]  Pain in calf, thigh, or hip brought on by walking? []  Pain in feet at night that wakes you up from your sleep? []  Blood clot in your veins? []  Leg swelling?  Pulmonary:  []  Oxygen at home? []  Productive cough? []  Wheezing?  Neurologic:  []  Sudden weakness in arms or legs? []  Sudden numbness in arms or legs? []  Sudden onset of difficult speaking or slurred speech? []  Temporary loss of vision in one eye? []  Problems with dizziness?  Gastrointestinal:  []  Blood in stool? []  Vomited  blood?  Genitourinary:  []  Burning when urinating? []  Blood in urine?  Psychiatric:  []  Major depression  Hematologic:  []  Bleeding problems? []  Problems with blood clotting?  Dermatologic:  []  Rashes or ulcers?  Constitutional:  []  Fever or chills?  Ear/Nose/Throat:  []  Change in hearing? []  Nose bleeds? []  Sore throat?  Musculoskeletal:  []  Back pain? []  Joint pain? []  Muscle pain?   Physical Examination   Vitals:   10/18/21 0939  BP: 105/79  Pulse: 100  Resp: 20  Temp: 97.9 F (36.6 C)  TempSrc: Temporal  SpO2: 97%  Weight: 180 lb 9.6 oz (81.9 kg)  Height: 6' (1.829 m)   Body mass index is 24.49 kg/m.  General:  WDWN in NAD; vital signs documented above Gait: Not observed HENT: WNL, normocephalic Pulmonary: normal non-labored breathing , without Rales, rhonchi,  wheezing Cardiac: regular HR, without murmurs, without carotid bruit Abdomen: soft, NT, no masses Skin: without rashes Vascular Exam/Pulses: 2+ palpable femoral pulses. Non palpable pedal pulses, however feet are warm and well perfused. Biphasic posterior tibial and peroneal doppler signals of left leg. Extremities: without ischemic changes, without Gangrene , without cellulitis; with open wounds; R 4th ray incision has dehisced but with good granulation tissue. No surrounding erythema or tissue ischemia.    Musculoskeletal: no muscle wasting or atrophy  Neurologic: A&O X 3;  No focal weakness or paresthesias are detected Psychiatric:  The pt has Normal affect.  Non-Invasive Vascular imaging   ABI (10/18/2021)  Right    Rt Pressure (mmHg)IndexWaveformComment         +---------+------------------+-----+--------+--------------+  Brachial                                restricted arm  +---------+------------------+-----+--------+--------------+  PTA      95                0.90 biphasic                +---------+------------------+-----+--------+--------------+  DP        92                0.88 biphasic                +---------+------------------+-----+--------+--------------+  Great Toe41                0.39                         +---------+------------------+-----+--------+--------------+   +---------+------------------+-----+--------+-------+  Left     Lt Pressure (mmHg)IndexWaveformComment  +---------+------------------+-----+--------+-------+  Brachial 105                                     +---------+------------------+-----+--------+-------+  PTA      77                0.73 biphasic         +---------+------------------+-----+--------+-------+  DP       71                0.68 biphasic         +---------+------------------+-----+--------+-------+  Great Toe41                0.39                  +---------+------------------+-----+--------+-------+   +-------+-----------+-----------+------------+------------+  ABI/TBIToday's ABIToday's TBIPrevious ABIPrevious TBI  +-------+-----------+-----------+------------+------------+  Right  0.90       0.39       0.82        0             +-------+-----------+-----------+------------+------------+  Left   0.73       0.39       0.76        0.50           RLE Arterial Duplex (10/18/2021)  +----------+--------+-----+--------+---------+--------+  RIGHT     PSV cm/sRatioStenosisWaveform Comments  +----------+--------+-----+--------+---------+--------+  CFA Distal75                   triphasic          +----------+--------+-----+--------+---------+--------+  DFA       70                   triphasic          +----------+--------+-----+--------+---------+--------+  SFA Prox  66                   triphasic          +----------+--------+-----+--------+---------+--------+  SFA Mid   97                   triphasic          +----------+--------+-----+--------+---------+--------+  SFA Distal50                    triphasic          +----------+--------+-----+--------+---------+--------+  POP Mid   53                   triphasic          +----------+--------+-----+--------+---------+--------+  PTA Prox  58                   triphasic          +----------+--------+-----+--------+---------+--------+  PTA Mid   59                   biphasic           +----------+--------+-----+--------+---------+--------+  PTA Distal38                   biphasic           +----------+--------+-----+--------+---------+--------+     Medical Decision Making   Kairon Peloso is a 61 y.o. male post op from aortogram with R PTA angioplasty on 09/02/2021 and R 4th ray amputation on 09/04/2021  The patient's right ABI has improved from 0.82 to 0.90 as of today.  The patient's claudication has improved.  The patient's left ABI is essentially unchanged, it is 0.73 today and was 0.76 on 08/30/2021.  He states that his claudication in the left leg is  worse than the right, however it has not worsened since we last saw him. The patient has a nonpalpable right pedal pulse, his peroneal artery has brisk flow on Doppler. His fourth ray amputation of the right foot has opened since the sutures were removed on 09/28/2021.  There are no signs of infection.  There is evidence of good healing with granulation tissue.  Will transition patient to wet-to-dry dressings of the wound, changing the gauze 1 times daily.  The patient will follow-up with Dr. [name] this Friday, 10/22/2021. I discussed in depth with the patient the nature of atherosclerosis, and emphasized the importance of maximal medical management including strict control of blood pressure, blood glucose, and lipid levels, antiplatelet agents, obtaining regular exercise, and cessation of smoking.   The patient is aware that without maximal medical management the underlying atherosclerotic disease process will progress, limiting the benefit of any interventions. The  patient is currently on a statin: Lipitor.  The patient is currently on an anti-platelet: ASA and Plavix. The patient will follow up in 3 months with bilateral ABIs and RLE arterial duplex study.  He understands to call/come in sooner if he experiences worsening leg pain or new wounds of the lower extremities.   Loel Dubonnet PA-C [facility] Office: [phone]  Clinic MD: Myra Gianotti

## 2021-10-19 ENCOUNTER — Telehealth: Payer: Self-pay | Admitting: Orthopedic Surgery

## 2021-10-19 MED ORDER — HYDROCODONE-ACETAMINOPHEN 5-325 MG PO TABS: 1.0000 | ORAL_TABLET | Freq: Three times a day (TID) | ORAL | 0 refills | Status: DC | PRN

## 2021-10-19 NOTE — Telephone Encounter (Signed)
Patient called in requesting Hydrocodone for pain just enough until he comes in for his appt Friday 10/22/21

## 2021-10-19 NOTE — Telephone Encounter (Signed)
09/04/2021 s/p 4th ray amputation. Please see message below and advise.

## 2021-10-21 ENCOUNTER — Encounter: Payer: Self-pay | Admitting: Cardiology

## 2021-10-22 ENCOUNTER — Encounter: Payer: Self-pay | Admitting: Internal Medicine

## 2021-10-22 ENCOUNTER — Ambulatory Visit (INDEPENDENT_AMBULATORY_CARE_PROVIDER_SITE_OTHER): Payer: Self-pay | Admitting: Family

## 2021-10-22 ENCOUNTER — Other Ambulatory Visit: Payer: Self-pay

## 2021-10-22 ENCOUNTER — Ambulatory Visit (INDEPENDENT_AMBULATORY_CARE_PROVIDER_SITE_OTHER): Payer: Self-pay | Admitting: Internal Medicine

## 2021-10-22 ENCOUNTER — Encounter: Payer: Self-pay | Admitting: Family

## 2021-10-22 VITALS — BP 103/76 | HR 97 | Resp 16 | Ht 72.0 in | Wt 181.0 lb

## 2021-10-22 DIAGNOSIS — M868X7 Other osteomyelitis, ankle and foot: Secondary | ICD-10-CM

## 2021-10-22 DIAGNOSIS — Z89421 Acquired absence of other right toe(s): Secondary | ICD-10-CM

## 2021-10-22 MED ORDER — NITROGLYCERIN 0.2 MG/HR TD PT24: 0.2000 mg | MEDICATED_PATCH | Freq: Every day | TRANSDERMAL | 2 refills | Status: DC

## 2021-10-22 MED ORDER — METRONIDAZOLE 500 MG PO TABS
500.0000 mg | ORAL_TABLET | Freq: Two times a day (BID) | ORAL | 0 refills | Status: DC
  Filled 2021-10-22: qty 60, 30d supply, fill #0

## 2021-10-22 MED ORDER — DOXYCYCLINE HYCLATE 100 MG PO TABS
100.0000 mg | ORAL_TABLET | Freq: Two times a day (BID) | ORAL | 0 refills | Status: DC
  Filled 2021-10-22: qty 60, 30d supply, fill #0

## 2021-10-22 MED ORDER — NITROGLYCERIN 0.2 MG/HR TD PT24
0.2000 mg | MEDICATED_PATCH | Freq: Every day | TRANSDERMAL | 2 refills | Status: DC
  Filled 2021-10-22: qty 30, 30d supply, fill #0

## 2021-10-22 NOTE — Progress Notes (Addendum)
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[place]) 10/18/2018   Transaminitis 10/17/2018   Hyperbilirubinemia 10/17/2018   Leukocytosis 10/17/2018   Sinusitis 10/17/2018   Chronic anticoagulation 09/30/2017   Smoker 09/30/2017   Cardiomyopathy ([place]) 09/30/2017   Sleep apnea suspected 09/30/2017   Essential hypertension 09/26/2017   Insulin dependent type 2 diabetes mellitus ([place]) 09/26/2017   Atrial fibrillation with RVR ([place]) 09/26/2017    Patient's Medications  New Prescriptions   No medications on file  Previous Medications   APIXABAN (ELIQUIS) 5 MG TABS TABLET    Take 1 tablet (5 mg total) by mouth 2 (two) times daily.   ASPIRIN EC 81 MG TABLET    Take 1 tablet (81 mg total) by mouth daily. Swallow whole.   ATORVASTATIN (LIPITOR) 80 MG TABLET    Take 1 tablet (80 mg total) by mouth daily.   CARVEDILOL (COREG) 3.125 MG TABLET    Take 1 tablet (3.125 mg total) by mouth 2 (two) times daily with a meal.   CETIRIZINE (ZYRTEC) 10 MG TABLET    Take 1 tablet (10 mg total) by mouth at bedtime as needed for allergies.   CLOPIDOGREL (PLAVIX) 75 MG TABLET    Take 1 tablet (75 mg total) by mouth daily with breakfast.   DIGOXIN (LANOXIN) 0.125 MG TABLET    Take 1 tablet (125 mcg total) by mouth once daily.   FLUTICASONE (FLONASE) 50 MCG/ACT NASAL SPRAY    Place 2 sprays into both nostrils daily.   FUROSEMIDE (LASIX) 40 MG TABLET    Take 1 tablet (40 mg total) by mouth daily.    HYDROCODONE-ACETAMINOPHEN (NORCO/VICODIN) 5-325 MG TABLET    Take 1 tablet by mouth every 8 (eight) hours as needed for moderate pain.   INSULIN ASPART (NOVOLOG) 100 UNIT/ML FLEXPEN    Inject 15 Units into the skin 3 (three) times daily with meals.   INSULIN GLARGINE (BASAGLAR KWIKPEN) 100 UNIT/ML    Inject 40 Units into the skin daily.   NITROGLYCERIN (NITRODUR - DOSED IN MG/24 HR) 0.2 MG/HR PATCH    Place 1 patch (0.2 mg total) onto the skin daily.   SACUBITRIL-VALSARTAN (ENTRESTO) 49-51 MG    Take 1 tablet by mouth 2 (two) times daily.   SPIRONOLACTONE (ALDACTONE) 25 MG TABLET    Take 1 tablet (25 mg total) by mouth daily.  Modified Medications   No medications on file  Discontinued Medications   METRONIDAZOLE (FLAGYL) 500 MG TABLET    Take 1 tablet (500 mg total) by mouth 2 (two) times daily.    Subjective: 61 year old male with past medical history as below presents for hospital follow-up.  He was admitted to [facility] 5/29 - 6/5 with right fourth toe osteomyelitis.  He had a recent spider bite and fifth digit had worsened with more pain and less movement.  He was started on vancomycin, metronidazole and cefepime found to have MRSA bacteremia during hospitalization.  Initially vascular engaged and  patient underwent arteriogram showing severe tibial artery occlusive disease.  Underwent recanalization of occluded posttibial artery and balloon angioplasty on 6/1.  He underwent amputation of fourth ray foot on 6/3 with Dr. Sharol Given, no cultures obtained.  Discharged ceftriaxone, daptomycin and metronidazole x6 weeks EOT 10/15/2021.  09/21/2021: Original bandage on from discharge. Reports more pain at amputation side. Denies fever and chills.   Interim seen by ortho today, noted dehiscence of incision, with scant drainage. Today 7/21: Pt reports he has yellowish drainage from wound. PICC line out. Foot pain continues. Denies fevers and chills.  Review of Systems: Review of Systems  All other systems  reviewed and are negative.   Past Medical History:  Diagnosis Date   Cardiomyopathy ([place])    a. EF 45% in 2019.   CHF (congestive heart failure) (HCC)    Chronic anticoagulation 09/30/2017   Diabetes mellitus type 2 in nonobese [facility])    Does not have health insurance    Essential hypertension 09/26/2017   Financial difficulties    H/O noncompliance with medical treatment, presenting hazards to health    Non-insulin treated type 2 diabetes mellitus ([place]) 09/26/2017   Persistent atrial fibrillation (HCC)    Sleep apnea suspected 09/30/2017    Social History   Tobacco Use   Smoking status: Former    Years: 15.00    Types: Cigarettes    Quit date: 03/18/2018    Years since quitting: 3.6   Smokeless tobacco: Never   Tobacco comments:    09/26/2017 "2-3 cigarettes/month now"  Vaping Use   Vaping Use: Never used  Substance Use Topics   Alcohol use: Yes    Alcohol/week: 3.0 standard drinks of alcohol    Types: 3 Cans of beer per week   Drug use: Never    Family History  Problem Relation Age of Onset   Hypertension Mother     No Known Allergies  Health Maintenance  Topic Date Due   COVID-19 Vaccine (1) Never done   FOOT EXAM  Never done   OPHTHALMOLOGY EXAM  Never done   Hepatitis C Screening  Never done   Zoster Vaccines- Shingrix (1 of 2) Never done   INFLUENZA VACCINE  11/02/2021   HEMOGLOBIN A1C  03/02/2022   TETANUS/TDAP  08/25/2029   HIV Screening  Completed   HPV VACCINES  Aged Out   COLONOSCOPY (Pts 45-19yrs Insurance coverage will need to be confirmed)  Discontinued    Objective:  Vitals:   10/22/21 1516  BP: 103/76  Pulse: 97  Resp: 16  SpO2: 98%  Weight: 181 lb (82.1 kg)  Height: 6' (1.829 m)   Body mass index is 24.55 kg/m.  Physical Exam Constitutional:      General: He is not in acute distress.    Appearance: He is normal weight. He is not toxic-appearing.  HENT:     Head: Normocephalic and atraumatic.     Right Ear: External ear  normal.     Left Ear: External ear normal.     Nose: No congestion or rhinorrhea.     Mouth/Throat:     Mouth: Mucous membranes are moist.     Pharynx: Oropharynx is clear.  Eyes:     Extraocular Movements: Extraocular movements intact.     Conjunctiva/sclera: Conjunctivae normal.     Pupils: Pupils are equal, round, and reactive to light.  Cardiovascular:     Rate and Rhythm: Normal rate and regular rhythm.     Heart sounds: No murmur heard.  No friction rub. No gallop.  Pulmonary:     Effort: Pulmonary effort is normal.     Breath sounds: Normal breath sounds.  Abdominal:     General: Abdomen is flat. Bowel sounds are normal.     Palpations: Abdomen is soft.  Musculoskeletal:        General: No swelling. Normal range of motion.     Cervical back: Normal range of motion and neck supple.  Skin:    General: Skin is warm and dry.  Neurological:     General: No focal deficit present.     Mental Status: He is oriented to person, place, and time.  Psychiatric:        Mood and Affect: Mood normal.     Lab Results Lab Results  Component Value Date   WBC 10.4 09/04/2021   HGB 14.0 09/04/2021   HCT 40.2 09/04/2021   MCV 91.6 09/04/2021   PLT 240 09/04/2021    Lab Results  Component Value Date   CREATININE 0.96 09/05/2021   BUN 16 09/05/2021   NA 133 (L) 09/05/2021   K 4.4 09/05/2021   CL 103 09/05/2021   CO2 21 (L) 09/05/2021    Lab Results  Component Value Date   ALT 17 08/30/2021   AST 21 08/30/2021   ALKPHOS 87 08/30/2021   BILITOT 1.2 08/30/2021    Lab Results  Component Value Date   CHOL 151 09/03/2021   HDL 23 (L) 09/03/2021   LDLCALC 78 09/03/2021   TRIG 248 (H) 09/03/2021   CHOLHDL 6.6 09/03/2021   No results found for: "LABRPR", "RPRTITER" No results found for: "HIV1RNAQUANT", "HIV1RNAVL", "CD4TABS" A/P #MRSA bacteremia #Right 4th toe osteomyelitis SP 4ray amputation on 6/3, no Cx #DM-poorly controlled(A1c 11.2 on 08/30/21) -Labs: 10/11/21: wbc  8.4, Scr 1.13,  -TTE and TEE showed no vegetation suring hospitalization -daptomycin + CTX+ metronidazole EOT 10/15/21. -PICC line is out. -Follows with Ortho (Dr. Sharol Given)- appt with Dondra Prader NP today, noted to have open wound. Follow up on 11/09/21 with Dr. Sharol Given.  -He continues to have pain and drainage from the site. Will get labs today off of antibiotics. Will order imaging, labs. Prescribe PO antibiotics till repeat visit in 3 weeks o review imaging.  Plan -Labs today: cbc, cmp, esr, crp -MRI right foot -Follow-up in 3 weeks.   I spent more than 60 minutes for this patient encounter including reviewing data/chart, and coordinating care and >50% direct face to face time providing counseling/discussing diagnostics/treatment plan with patient  Laurice Record, [place] [facility] 10/22/2021, 3:30 PM

## 2021-10-22 NOTE — Progress Notes (Signed)
Post-Op Visit Note   Patient: David [name]           Date of Birth: [birth year]           MRN: [id number] Visit Date: 10/22/2021 PCP: Claiborne Rigg, NP  Chief Complaint:  Chief Complaint  Patient presents with   Right Foot - Routine Post Op    09/07/21 right 4th ray amputation    HPI:  HPI The patient is a 61 year old gentleman seen status post right fourth ray amputation he states he has been minimizing his weightbearing in a postop shoe continues to pack open with dressings he has been seen by vascular surgery as well they are considering intervention of the left lower extremity  Vascular has recommended wet-to-dry dressings of the fourth ray amputation  Ortho Exam On examination of the fourth ray amputation site dorsally he has dehiscence of his incision this is 25 mm in length open 5 mm in width this does not probe there is flat pink tissue scant drainage there is no surrounding erythema no odor no warmth  Visit Diagnoses: No diagnosis found.  Plan: Given Iodosorb for dressing changes.  Continue daily Dial soap cleansing.  Minimize weightbearing.  We will hold off on return to work.  Follow-Up Instructions: No follow-ups on file.   Imaging: No results found.  Orders:  No orders of the defined types were placed in this encounter.  No orders of the defined types were placed in this encounter.    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)    Gangrene of toe of right foot (HCC)    Sepsis (HCC) 08/29/2021   SIRS (systemic inflammatory response syndrome) (HCC) 08/29/2021   Hyponatremia 08/29/2021   Hypomagnesemia 08/29/2021   Cellulitis in diabetic foot (HCC) 08/29/2021   Pure hypercholesterolemia 06/17/2021   Pain of left hand 09/06/2019   Cellulitis 08/26/2019   Persistent atrial fibrillation (HCC) 08/26/2019   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) 10/18/2018    Transaminitis 10/17/2018   Hyperbilirubinemia 10/17/2018   Leukocytosis 10/17/2018   Sinusitis 10/17/2018   Chronic anticoagulation 09/30/2017   Smoker 09/30/2017   Cardiomyopathy (HCC) 09/30/2017   Sleep apnea suspected 09/30/2017   Essential hypertension 09/26/2017   Insulin dependent type 2 diabetes mellitus (HCC) 09/26/2017   Atrial fibrillation with RVR (HCC) 09/26/2017   Past Medical History:  Diagnosis Date   Cardiomyopathy (HCC)    a. EF 45% in 2019.   CHF (congestive heart failure) (HCC)    Chronic anticoagulation 09/30/2017   Diabetes mellitus type 2 in nonobese [facility])    Does not have health insurance    Essential hypertension 09/26/2017   Financial difficulties    H/O noncompliance with medical treatment, presenting hazards to health    Non-insulin treated type 2 diabetes mellitus (HCC) 09/26/2017   Persistent atrial fibrillation (HCC)    Sleep apnea suspected 09/30/2017    Family History  Problem Relation Age of Onset   Hypertension Mother     Past Surgical History:  Procedure Laterality Date   ABDOMINAL AORTOGRAM W/LOWER EXTREMITY N/A 09/02/2021   Procedure: ABDOMINAL AORTOGRAM W/LOWER EXTREMITY;  Surgeon: Nada Libman, MD;  Location: [facility] INVASIVE CV LAB;  Service: Cardiovascular;  Laterality: N/A;   AMPUTATION Right 09/04/2021   Procedure: 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;  Surgeon: Nadara Mustard, MD;  Location: [facility] OR;  Service: Orthopedics;  Laterality: Right;   APPENDECTOMY  1971   CARDIOVERSION N/A 09/28/2017   Procedure: CARDIOVERSION;  Surgeon: Thurmon Fair, MD;  Location: [facility] ENDOSCOPY;  Service: Cardiovascular;  Laterality: N/A;   PERIPHERAL VASCULAR BALLOON ANGIOPLASTY  09/02/2021   Procedure: PERIPHERAL VASCULAR BALLOON ANGIOPLASTY;  Surgeon: Nada Libman, MD;  Location: [facility] INVASIVE CV LAB;  Service: Cardiovascular;;   TEE WITHOUT CARDIOVERSION N/A 09/28/2017   Procedure: TRANSESOPHAGEAL ECHOCARDIOGRAM (TEE);  Surgeon: Thurmon Fair, MD;  Location[facility]  ENDOSCOPY;  Service: Cardiovascular;  Laterality: N/A;   TEE WITHOUT CARDIOVERSION N/A 09/03/2021   Procedure: TRANSESOPHAGEAL ECHOCARDIOGRAM (TEE);  Surgeon: Chilton Si, MD;  Location: [facility] ENDOSCOPY;  Service: Cardiovascular;  Laterality: N/A;   Social History   Occupational History   Not on file  Tobacco Use   Smoking status: Former    Years: 15.00    Types: Cigarettes    Quit date: 03/18/2018    Years since quitting: 3.6   Smokeless tobacco: Never   Tobacco comments:    09/26/2017 "2-3 cigarettes/month now"  Vaping Use   Vaping Use: Never used  Substance and Sexual Activity   Alcohol use: Yes    Alcohol/week: 3.0 standard drinks of alcohol    Types: 3 Cans of beer per week   Drug use: Never   Sexual activity: Not Currently

## 2021-10-23 LAB — CBC WITH DIFFERENTIAL/PLATELET
Absolute Monocytes: 863 cells/uL (ref 200–950)
Basophils Absolute: 113 cells/uL (ref 0–200)
Basophils Relative: 1.5 %
Eosinophils Absolute: 308 cells/uL (ref 15–500)
Eosinophils Relative: 4.1 %
HCT: 47.3 % (ref 38.5–50.0)
Hemoglobin: 16 g/dL (ref 13.2–17.1)
Lymphs Abs: 2123 cells/uL (ref 850–3900)
MCH: 31.6 pg (ref 27.0–33.0)
MCHC: 33.8 g/dL (ref 32.0–36.0)
MCV: 93.5 fL (ref 80.0–100.0)
MPV: 9.4 fL (ref 7.5–12.5)
Monocytes Relative: 11.5 %
Neutro Abs: 4095 cells/uL (ref 1500–7800)
Neutrophils Relative %: 54.6 %
Platelets: 265 10*3/uL (ref 140–400)
RBC: 5.06 10*6/uL (ref 4.20–5.80)
RDW: 13.3 % (ref 11.0–15.0)
Total Lymphocyte: 28.3 %
WBC: 7.5 10*3/uL (ref 3.8–10.8)

## 2021-10-23 LAB — COMPLETE METABOLIC PANEL WITH GFR
AG Ratio: 1.5 (calc) (ref 1.0–2.5)
ALT: 25 U/L (ref 9–46)
AST: 17 U/L (ref 10–35)
Albumin: 4.2 g/dL (ref 3.6–5.1)
Alkaline phosphatase (APISO): 79 U/L (ref 35–144)
BUN/Creatinine Ratio: 16 (calc) (ref 6–22)
BUN: 25 mg/dL (ref 7–25)
CO2: 23 mmol/L (ref 20–32)
Calcium: 9.6 mg/dL (ref 8.6–10.3)
Chloride: 97 mmol/L — ABNORMAL LOW (ref 98–110)
Creat: 1.57 mg/dL — ABNORMAL HIGH (ref 0.70–1.35)
Globulin: 2.8 g/dL (calc) (ref 1.9–3.7)
Glucose, Bld: 225 mg/dL — ABNORMAL HIGH (ref 65–99)
Potassium: 4.4 mmol/L (ref 3.5–5.3)
Sodium: 134 mmol/L — ABNORMAL LOW (ref 135–146)
Total Bilirubin: 1.3 mg/dL — ABNORMAL HIGH (ref 0.2–1.2)
Total Protein: 7 g/dL (ref 6.1–8.1)
eGFR: 50 mL/min/{1.73_m2} — ABNORMAL LOW (ref >=60)

## 2021-10-23 LAB — SEDIMENTATION RATE: Sed Rate: 9 mm/h (ref 0–20)

## 2021-10-23 LAB — C-REACTIVE PROTEIN: CRP: 1.3 mg/L (ref <8.0)

## 2021-10-26 ENCOUNTER — Other Ambulatory Visit: Payer: Self-pay

## 2021-10-26 DIAGNOSIS — I739 Peripheral vascular disease, unspecified: Secondary | ICD-10-CM

## 2021-10-29 ENCOUNTER — Telehealth: Payer: Self-pay | Admitting: Family

## 2021-10-29 ENCOUNTER — Other Ambulatory Visit: Payer: Self-pay | Admitting: Orthopedic Surgery

## 2021-10-29 MED ORDER — HYDROCODONE-ACETAMINOPHEN 5-325 MG PO TABS
1.0000 | ORAL_TABLET | Freq: Three times a day (TID) | ORAL | 0 refills | Status: DC | PRN
Start: 2021-10-29 — End: 2021-11-09

## 2021-10-29 NOTE — Telephone Encounter (Signed)
S/p 09/04/21 right 4th ray amputation Hydrocodone last filled 10/19/21 #30

## 2021-10-29 NOTE — Telephone Encounter (Signed)
Pt called requesting a refill. Please send to pharmacy on file. Pt phone number is [phone].

## 2021-11-01 ENCOUNTER — Ambulatory Visit (HOSPITAL_COMMUNITY)
Admission: RE | Admit: 2021-11-01 | Discharge: 2021-11-01 | Disposition: A | Discharge status: Home / Self Care | Payer: Self-pay | Source: Ambulatory Visit | Attending: Internal Medicine | Admitting: Internal Medicine

## 2021-11-01 DIAGNOSIS — M868X7 Other osteomyelitis, ankle and foot: Secondary | ICD-10-CM | POA: Insufficient documentation

## 2021-11-01 MED ORDER — GADOBUTROL 1 MMOL/ML IV SOLN
8.0000 mL | Freq: Once | INTRAVENOUS | Status: AC | PRN
  Administered 2021-11-01: 8 mL via INTRAVENOUS

## 2021-11-04 ENCOUNTER — Inpatient Hospital Stay: Payer: Self-pay | Admitting: Family Medicine

## 2021-11-09 ENCOUNTER — Ambulatory Visit (INDEPENDENT_AMBULATORY_CARE_PROVIDER_SITE_OTHER): Payer: Self-pay | Admitting: Orthopedic Surgery

## 2021-11-09 ENCOUNTER — Encounter: Payer: Self-pay | Admitting: Orthopedic Surgery

## 2021-11-09 DIAGNOSIS — Z89421 Acquired absence of other right toe(s): Secondary | ICD-10-CM

## 2021-11-09 MED ORDER — HYDROCODONE-ACETAMINOPHEN 5-325 MG PO TABS
1.0000 | ORAL_TABLET | Freq: Three times a day (TID) | ORAL | 0 refills | Status: DC | PRN
Start: 2021-11-09 — End: 2021-12-08
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
## 2021-11-12 ENCOUNTER — Other Ambulatory Visit: Payer: Self-pay

## 2021-11-12 ENCOUNTER — Encounter: Payer: Self-pay | Admitting: Internal Medicine

## 2021-11-12 ENCOUNTER — Ambulatory Visit (INDEPENDENT_AMBULATORY_CARE_PROVIDER_SITE_OTHER): Payer: Self-pay | Admitting: Internal Medicine

## 2021-11-12 VITALS — BP 117/76 | HR 98 | Temp 97.5°F | Ht 72.0 in | Wt 188.0 lb

## 2021-11-12 DIAGNOSIS — M868X9 Other osteomyelitis, unspecified sites: Secondary | ICD-10-CM

## 2021-11-12 NOTE — Progress Notes (Unsigned)
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 ([place])    Gangrene of toe of right foot ([place])    Sepsis ([place]) 08/29/2021   SIRS (systemic inflammatory response syndrome) ([place]) 08/29/2021   Hyponatremia 08/29/2021   Hypomagnesemia 08/29/2021   Cellulitis in diabetic foot ([place]) 08/29/2021   Pure hypercholesterolemia 06/17/2021   Pain of left hand 09/06/2019   Cellulitis 08/26/2019   Persistent atrial fibrillation ([place]) 08/26/2019   HFimpEF (heart failure with improved ejection fraction) ([place]) 10/18/2018   Transaminitis 10/17/2018   Hyperbilirubinemia 10/17/2018   Leukocytosis 10/17/2018   Sinusitis 10/17/2018   Chronic anticoagulation 09/30/2017   Smoker 09/30/2017   Cardiomyopathy ([place]) 09/30/2017   Sleep apnea suspected 09/30/2017   Essential hypertension 09/26/2017   Insulin dependent type 2 diabetes mellitus ([place]) 09/26/2017   Atrial fibrillation with RVR ([place]) 09/26/2017    Patient's Medications  New Prescriptions   No medications on file  Previous Medications   APIXABAN (ELIQUIS) 5 MG TABS TABLET    Take 1 tablet (5 mg total) by mouth 2 (two) times daily.   ASPIRIN EC 81 MG TABLET    Take 1 tablet (81 mg total) by mouth daily. Swallow whole.   ATORVASTATIN (LIPITOR) 80 MG TABLET    Take 1 tablet (80 mg total) by mouth daily.   CARVEDILOL (COREG) 3.125 MG TABLET    Take 1 tablet (3.125 mg total) by mouth 2 (two) times daily with a meal.   CETIRIZINE (ZYRTEC) 10 MG TABLET    Take 1 tablet (10 mg total) by mouth at bedtime as needed for allergies.   CLOPIDOGREL (PLAVIX) 75 MG TABLET    Take 1 tablet (75 mg total) by mouth daily with breakfast.   DIGOXIN (LANOXIN) 0.125 MG TABLET    Take 1 tablet (125 mcg total) by mouth once daily.   DOXYCYCLINE (VIBRA-TABS) 100 MG TABLET    Take 1 tablet (100 mg total) by mouth 2 (two) times daily with a meal.   FLUTICASONE (FLONASE) 50 MCG/ACT NASAL SPRAY    Place 2 sprays into both  nostrils daily.   FUROSEMIDE (LASIX) 40 MG TABLET    Take 1 tablet (40 mg total) by mouth daily.   HYDROCODONE-ACETAMINOPHEN (NORCO/VICODIN) 5-325 MG TABLET    Take 1 tablet by mouth every 8 (eight) hours as needed for moderate pain.   INSULIN ASPART (NOVOLOG) 100 UNIT/ML FLEXPEN    Inject 15 Units into the skin 3 (three) times daily with meals.   INSULIN GLARGINE (BASAGLAR KWIKPEN) 100 UNIT/ML    Inject 40 Units into the skin daily.   METRONIDAZOLE (FLAGYL) 500 MG TABLET    Take 1 tablet (500 mg total) by mouth 2 (two) times daily.   NITROGLYCERIN (NITRODUR - DOSED IN MG/24 HR) 0.2 MG/HR PATCH    Place 1 patch (0.2 mg total) onto the skin daily.   SACUBITRIL-VALSARTAN (ENTRESTO) 49-51 MG    Take 1 tablet by mouth 2 (two) times daily.   SPIRONOLACTONE (ALDACTONE) 25 MG TABLET    Take 1 tablet (25 mg total) by mouth daily.  Modified Medications   No medications on file  Discontinued Medications   No medications on file    Subjective: 61 year old male with past medical history as below presents for hospital follow-up.  He was admitted to [facility] 5/29 - 6/5 with right fourth toe osteomyelitis.  He had a recent spider bite and fifth digit had  worsened with more pain and less movement.  He was started on vancomycin, metronidazole and cefepime found to have MRSA bacteremia during hospitalization.  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  Underwent recanalization of occluded posttibial artery and balloon angioplasty on 6/1.  He underwent amputation of fourth ray foot on 6/3 with Dr. Sharol Given, no cultures obtained.  Discharged ceftriaxone, daptomycin and metronidazole x6 weeks EOT 10/15/2021.  09/21/2021: Original bandage on from discharge. Reports more pain at amputation side. Denies fever and chills.   Interim seen by ortho today, noted dehiscence of incision, with scant drainage.  7/21: Pt reports he has yellowish drainage from wound.  PICC line out. Foot pain continues. Denies fevers and chills.   Interim: Pt was started on doxy at last visit due concern of worsening infection. MRI ordered which showed  marrow edema of residual fourth toe Today 11/12/21: Review of Systems: ROS  Past Medical History:  Diagnosis Date   Cardiomyopathy ([place])    a. EF 45% in 2019.   CHF (congestive heart failure) (HCC)    Chronic anticoagulation 09/30/2017   Diabetes mellitus type 2 in nonobese [facility])    Does not have health insurance    Essential hypertension 09/26/2017   Financial difficulties    H/O noncompliance with medical treatment, presenting hazards to health    Non-insulin treated type 2 diabetes mellitus ([place]) 09/26/2017   Persistent atrial fibrillation (HCC)    Sleep apnea suspected 09/30/2017    Social History   Tobacco Use   Smoking status: Former    Years: 15.00    Types: Cigarettes    Quit date: 03/18/2018    Years since quitting: 3.6   Smokeless tobacco: Never   Tobacco comments:    09/26/2017 "2-3 cigarettes/month now"  Vaping Use   Vaping Use: Never used  Substance Use Topics   Alcohol use: Yes    Alcohol/week: 3.0 standard drinks of alcohol    Types: 3 Cans of beer per week   Drug use: Never    Family History  Problem Relation Age of Onset   Hypertension Mother     No Known Allergies  Health Maintenance  Topic Date Due   COVID-19 Vaccine (1) Never done   FOOT EXAM  Never done   OPHTHALMOLOGY EXAM  Never done   Hepatitis C Screening  Never done   Zoster Vaccines- Shingrix (1 of 2) Never done   INFLUENZA VACCINE  11/02/2021   HEMOGLOBIN A1C  03/02/2022   TETANUS/TDAP  08/25/2029   HIV Screening  Completed   HPV VACCINES  Aged Out   COLONOSCOPY (Pts 45-7yrs Insurance coverage will need to be confirmed)  Discontinued    Objective:  There were no vitals filed for this visit. There is no height or weight on file to calculate BMI.  Physical Exam  Lab Results Lab Results  Component  Value Date   WBC 7.5 10/22/2021   HGB 16.0 10/22/2021   HCT 47.3 10/22/2021   MCV 93.5 10/22/2021   PLT 265 10/22/2021    Lab Results  Component Value Date   CREATININE 1.57 (H) 10/22/2021   BUN 25 10/22/2021   NA 134 (L) 10/22/2021   K 4.4 10/22/2021   CL 97 (L) 10/22/2021   CO2 23 10/22/2021    Lab Results  Component Value Date   ALT 25 10/22/2021   AST 17 10/22/2021   ALKPHOS 87 08/30/2021   BILITOT 1.3 (H) 10/22/2021    Lab  Results  Component Value Date   CHOL 151 09/03/2021   HDL 23 (L) 09/03/2021   LDLCALC 78 09/03/2021   TRIG 248 (H) 09/03/2021   CHOLHDL 6.6 09/03/2021   No results found for: "LABRPR", "RPRTITER" No results found for: "HIV1RNAQUANT", "HIV1RNAVL", "CD4TABS"   A/P #MRSA bacteremia #Right 4th toe osteomyelitis SP 4ray amputation on 6/3, no Cx #DM-poorly controlled(A1c 11.2 on 08/30/21) -Pt completed dapt+ ctx_ metro x 6 weeks on 7/14 -At 7/21 visit started on doxy due to concern for worsening 4th toe amputation site(drainage and pain present). Labs -MRI on 7/31 showed postsurgical changes with fourth ray amputation, mild reactive marrow change and residual fourth metatarsal.  Mild marrow edema and enhancement of third metatarsal head, could be reactive marrow versus osteomyelitis.  Dorsolateral forefoot soft tissue wound with underlying communicating fluid, correlate with drainage. 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  -Labs today CBC, CMP, ESR, CRP - Patient's follow-up with Dr. Sharol Given in 2 weeks - Follow-up with infectious disease in 1 week   Laurice Record, [place] [facility] 11/12/2021, 3:57 PM

## 2021-11-13 LAB — COMPLETE METABOLIC PANEL WITH GFR
AG Ratio: 1.5 (calc) (ref 1.0–2.5)
ALT: 19 U/L (ref 9–46)
AST: 19 U/L (ref 10–35)
Albumin: 4.1 g/dL (ref 3.6–5.1)
Alkaline phosphatase (APISO): 70 U/L (ref 35–144)
BUN/Creatinine Ratio: 22 (calc) (ref 6–22)
BUN: 38 mg/dL — ABNORMAL HIGH (ref 7–25)
CO2: 25 mmol/L (ref 20–32)
Calcium: 9.7 mg/dL (ref 8.6–10.3)
Chloride: 100 mmol/L (ref 98–110)
Creat: 1.73 mg/dL — ABNORMAL HIGH (ref 0.70–1.35)
Globulin: 2.8 g/dL (calc) (ref 1.9–3.7)
Glucose, Bld: 180 mg/dL — ABNORMAL HIGH (ref 65–99)
Potassium: 4.6 mmol/L (ref 3.5–5.3)
Sodium: 134 mmol/L — ABNORMAL LOW (ref 135–146)
Total Bilirubin: 1.3 mg/dL — ABNORMAL HIGH (ref 0.2–1.2)
Total Protein: 6.9 g/dL (ref 6.1–8.1)
eGFR: 45 mL/min/{1.73_m2} — ABNORMAL LOW (ref >=60)

## 2021-11-13 LAB — CBC WITH DIFFERENTIAL/PLATELET
Absolute Monocytes: 936 cells/uL (ref 200–950)
Basophils Absolute: 88 cells/uL (ref 0–200)
Basophils Relative: 1.1 %
Eosinophils Absolute: 216 cells/uL (ref 15–500)
Eosinophils Relative: 2.7 %
HCT: 44.1 % (ref 38.5–50.0)
Hemoglobin: 15.4 g/dL (ref 13.2–17.1)
Lymphs Abs: 1968 cells/uL (ref 850–3900)
MCH: 32.4 pg (ref 27.0–33.0)
MCHC: 34.9 g/dL (ref 32.0–36.0)
MCV: 92.6 fL (ref 80.0–100.0)
MPV: 9.3 fL (ref 7.5–12.5)
Monocytes Relative: 11.7 %
Neutro Abs: 4792 cells/uL (ref 1500–7800)
Neutrophils Relative %: 59.9 %
Platelets: 261 10*3/uL (ref 140–400)
RBC: 4.76 10*6/uL (ref 4.20–5.80)
RDW: 13.4 % (ref 11.0–15.0)
Total Lymphocyte: 24.6 %
WBC: 8 10*3/uL (ref 3.8–10.8)

## 2021-11-13 LAB — SEDIMENTATION RATE: Sed Rate: 14 mm/h (ref 0–20)

## 2021-11-13 LAB — C-REACTIVE PROTEIN: CRP: 1.4 mg/L (ref <8.0)

## 2021-11-15 NOTE — Addendum Note (Signed)
Addended byDanelle Earthly on: 11/15/2021 05:13 AM   Modules accepted: Level of Service

## 2021-11-18 ENCOUNTER — Inpatient Hospital Stay: Payer: Self-pay | Admitting: Physician Assistant

## 2021-11-29 ENCOUNTER — Ambulatory Visit: Payer: Self-pay | Admitting: Orthopedic Surgery

## 2021-12-01 ENCOUNTER — Other Ambulatory Visit: Payer: Self-pay | Admitting: Family Medicine

## 2021-12-01 DIAGNOSIS — I429 Cardiomyopathy, unspecified: Secondary | ICD-10-CM

## 2021-12-01 NOTE — Telephone Encounter (Signed)
Medication Refill - Medication:  atorvastatin (LIPITOR) 80 MG tablet clopidogrel (PLAVIX) 75 MG tablet digoxin (LANOXIN) 0.125 MG tablet carvedilol (COREG) 3.125 MG tablet furosemide (LASIX) 40 MG tablet spironolactone (ALDACTONE) 25 MG tablet  Has the patient contacted their pharmacy? No.   Preferred Pharmacy (with phone number or street name):  [facility] Phone:  [phone]  Fax:  [phone]     Has the patient been seen for an appointment in the last year OR does the patient have an upcoming appointment? Yes.    Agent: Please be advised that RX refills may take up to 3 business days. We ask that you follow-up with your pharmacy.

## 2021-12-02 ENCOUNTER — Inpatient Hospital Stay: Payer: Self-pay | Admitting: Physician Assistant

## 2021-12-02 ENCOUNTER — Other Ambulatory Visit: Payer: Self-pay

## 2021-12-02 MED ORDER — CLOPIDOGREL BISULFATE 75 MG PO TABS
75.0000 mg | ORAL_TABLET | Freq: Every day | ORAL | 0 refills | Status: DC
  Filled 2021-12-02: qty 30, 30d supply, fill #0

## 2021-12-02 MED ORDER — CARVEDILOL 3.125 MG PO TABS
3.1250 mg | ORAL_TABLET | Freq: Two times a day (BID) | ORAL | 0 refills | Status: DC
  Filled 2021-12-02: qty 60, 30d supply, fill #0

## 2021-12-02 MED ORDER — ATORVASTATIN CALCIUM 80 MG PO TABS
80.0000 mg | ORAL_TABLET | Freq: Every day | ORAL | 0 refills | Status: DC
  Filled 2021-12-02: qty 30, 30d supply, fill #0

## 2021-12-02 MED ORDER — FUROSEMIDE 40 MG PO TABS
40.0000 mg | ORAL_TABLET | Freq: Every day | ORAL | 0 refills | Status: DC
  Filled 2021-12-02: qty 30, 30d supply, fill #0

## 2021-12-02 MED ORDER — SPIRONOLACTONE 25 MG PO TABS
25.0000 mg | ORAL_TABLET | Freq: Every day | ORAL | 0 refills | Status: DC
  Filled 2021-12-02: qty 30, 30d supply, fill #0

## 2021-12-02 NOTE — Telephone Encounter (Signed)
Requested medication (s) are due for refill today: yes  Requested medication (s) are on the active medication list: yes  Last refill:  atorvastatin: 09/06/21 #30 1 RF                  Carvedilol: 09/06/21 #60 1 RF                  Clopidogrel: 09/06/21 #30 1 RF                  Furosemide: 09/06/21 #30 1 RF (prescription ended 11/05/21)                  Spironolactone: 06/25/21 #90 3 RF (prescription ended 11/27/21)   Future visit scheduled: yes  Notes to clinic:  Pt has not been seen by provider since 01/08/21. Pt had appt that he canceled and rescheduled. Pt has to cancel frequently. These rx was written at hospital discharge.   Requested Prescriptions  Pending Prescriptions Disp Refills   atorvastatin (LIPITOR) 80 MG tablet 30 tablet     Sig: Take 1 tablet (80 mg total) by mouth daily.     Cardiovascular:  Antilipid - Statins Failed - 12/01/2021  5:05 PM      Failed - Lipid Panel in normal range within the last 12 months    Cholesterol, Total  Date Value Ref Range Status  01/08/2021 233 (H) 100 - 199 mg/dL Final   Cholesterol  Date Value Ref Range Status  09/03/2021 151 0 - 200 mg/dL Final   LDL Chol Calc (NIH)  Date Value Ref Range Status  01/08/2021 122 (H) 0 - 99 mg/dL Final   LDL Cholesterol  Date Value Ref Range Status  09/03/2021 78 0 - 99 mg/dL Final    Comment:           Total Cholesterol/HDL:CHD Risk Coronary Heart Disease Risk Table                     Men   Women  1/2 Average Risk   3.4   3.3  Average Risk       5.0   4.4  2 X Average Risk   9.6   7.1  3 X Average Risk  23.4   11.0        Use the calculated Patient Ratio above and the CHD Risk Table to determine the patient's CHD Risk.        ATP III CLASSIFICATION (LDL):  <100     mg/dL   Optimal  100-129  mg/dL   Near or Above                    Optimal  130-159  mg/dL   Borderline  160-189  mg/dL   High  >190     mg/dL   Very High Performed at [facility] [address] [place], [place]  [address]    HDL  Date Value Ref Range Status  09/03/2021 23 (L) >40 mg/dL Final  01/08/2021 38 (L) >39 mg/dL Final   Triglycerides  Date Value Ref Range Status  09/03/2021 248 (H) <150 mg/dL Final         Passed - Patient is not pregnant      Passed - Valid encounter within last 12 months    Recent Outpatient Visits           10 months ago Type 2 diabetes mellitus with hyperglycemia, with long-term  current use of insulin [facility])   [place], Vernia Buff, NP   1 year ago Essential hypertension   [place], [place] W, NP   2 years ago Diabetes mellitus type 2, uncontrolled, with complications [facility])   [place] [employer], [place], [place]   3 years ago Diabetes mellitus type 2, uncontrolled, with complications [facility])   [place], Vernia Buff, NP       Future Appointments             In 2 weeks Thereasa Solo, Dionne Bucy, PA-C [place]   In 2 weeks Idolina Primer, Nedra Hai, PA-C [place]. [facility], [facility]   In 3 weeks Laurice Record, MD [facility], [facility]             carvedilol (COREG) 3.125 MG tablet 60 tablet     Sig: Take 1 tablet (3.125 mg total) by mouth 2 (two) times daily with a meal.     Cardiovascular: Beta Blockers 3 Failed - 12/01/2021  5:05 PM      Failed - Cr in normal range and within 360 days    Creat  Date Value Ref Range Status  11/12/2021 1.73 (H) 0.70 - 1.35 mg/dL Final   Creatinine, Urine  Date Value Ref Range Status  08/30/2021 38.98 mg/dL Final    Comment:    Performed at [facility], [place] [address], [place], [place] [address]         Failed - Valid encounter within last 6 months    Recent Outpatient Visits           10 months ago Type 2 diabetes mellitus with hyperglycemia, with long-term  current use of insulin [facility])   [place], Vernia Buff, NP   1 year ago Essential hypertension   [place], [place] W, NP   2 years ago Diabetes mellitus type 2, uncontrolled, with complications [facility])   [place], [place], [place]   3 years ago Diabetes mellitus type 2, uncontrolled, with complications [facility])   [place], Vernia Buff, NP       Future Appointments             In 2 weeks Thereasa Solo, Dionne Bucy, PA-C [place]   In 2 weeks Idolina Primer, Nedra Hai, PA-C [place]. [facility], [facility]   In 3 weeks Laurice Record, MD [facility], [facility]            Passed - AST in normal range and within 360 days    AST  Date Value Ref Range Status  11/12/2021 19 10 - 35 U/L Final         Passed - ALT in normal range and within 360 days    ALT  Date Value Ref Range Status  11/12/2021 19 9 - 46 U/L Final         Passed - Last BP in normal range    BP Readings from Last 1 Encounters:  11/12/21 117/76         Passed - Last Heart Rate in normal range  Pulse Readings from Last 1 Encounters:  11/12/21 98          clopidogrel (PLAVIX) 75 MG tablet 30 tablet     Sig: Take 1 tablet (75 mg total) by mouth daily with breakfast.     Hematology: Antiplatelets - clopidogrel Failed - 12/01/2021  5:05 PM      Failed - Cr in normal range and within 360 days    Creat  Date Value Ref Range Status  11/12/2021 1.73 (H) 0.70 - 1.35 mg/dL Final   Creatinine, Urine  Date Value Ref Range Status  08/30/2021 38.98 mg/dL Final    Comment:    Performed at [facility], [place] [address], [place], [place] [address]         Failed - Valid encounter within last 6 months    Recent Outpatient Visits           10 months ago Type 2 diabetes  mellitus with hyperglycemia, with long-term current use of insulin [facility])   [place], Vernia Buff, NP   1 year ago Essential hypertension   [place], [place] W, NP   2 years ago Diabetes mellitus type 2, uncontrolled, with complications [facility])   [place], [place], [place]   3 years ago Diabetes mellitus type 2, uncontrolled, with complications [facility])   [place], Vernia Buff, NP       Future Appointments             In 2 weeks Thereasa Solo, Dionne Bucy, PA-C [place]   In 2 weeks Idolina Primer, Nedra Hai, PA-C [place]. [facility], [facility]   In 3 weeks Laurice Record, MD [facility], [facility]            Passed - HCT in normal range and within 180 days    HCT  Date Value Ref Range Status  11/12/2021 44.1 38.5 - 50.0 % Final   Hematocrit  Date Value Ref Range Status  06/16/2021 47.0 37.5 - 51.0 % Final         Passed - HGB in normal range and within 180 days    Hemoglobin  Date Value Ref Range Status  11/12/2021 15.4 13.2 - 17.1 g/dL Final  06/16/2021 17.3 13.0 - 17.7 g/dL Final         Passed - PLT in normal range and within 180 days    Platelets  Date Value Ref Range Status  11/12/2021 261 140 - 400 Thousand/uL Final  06/16/2021 244 150 - 450 x10E3/uL Final          furosemide (LASIX) 40 MG tablet 30 tablet     Sig: Take 1 tablet (40 mg total) by mouth daily.     Cardiovascular:  Diuretics - Loop Failed - 12/01/2021  5:05 PM      Failed - Na in normal range and within 180 days    Sodium  Date Value Ref Range Status  11/12/2021 134 (L) 135 - 146 mmol/L Final  06/16/2021 131 (L) 134 - 144 mmol/L Final         Failed - Cr in normal range and within 180 days    Creat  Date Value Ref Range Status  11/12/2021 1.73  (H) 0.70 - 1.35 mg/dL Final   Creatinine, Urine  Date Value Ref Range Status  08/30/2021 38.98 mg/dL Final    Comment:    Performed at [facility], [place] [address], [place], [place] [address]         Failed - Valid encounter within last 6 months    Recent Outpatient Visits           10 months ago Type 2 diabetes mellitus with hyperglycemia, with long-term current use of insulin [facility])   [place], Vernia Buff, NP   1 year ago Essential hypertension   [place], [place] W, NP   2 years ago Diabetes mellitus type 2, uncontrolled, with complications [facility])   [place], [place], [place]   3 years ago Diabetes mellitus type 2, uncontrolled, with complications [facility])   [place], Vernia Buff, NP       Future Appointments             In 2 weeks Thereasa Solo, Dionne Bucy, PA-C [place]   In 2 weeks Idolina Primer, Nedra Hai, PA-C [place]. [facility], [facility]   In 3 weeks Laurice Record, MD [facility], [facility]            Passed - K in normal range and within 180 days    Potassium  Date Value Ref Range Status  11/12/2021 4.6 3.5 - 5.3 mmol/L Final         Passed - Ca in normal range and within 180 days    Calcium  Date Value Ref Range Status  11/12/2021 9.7 8.6 - 10.3 mg/dL Final         Passed - Cl in normal range and within 180 days    Chloride  Date Value Ref Range Status  11/12/2021 100 98 - 110 mmol/L Final         Passed - Mg Level in normal range and within 180 days    Magnesium  Date Value Ref Range Status  08/30/2021 2.0 1.7 - 2.4 mg/dL Final    Comment:    Performed at [facility], [place] [address], [place], [place] [address]         Passed - Last BP in normal range    BP Readings from Last 1  Encounters:  11/12/21 117/76          spironolactone (ALDACTONE) 25 MG tablet 90 tablet     Sig: Take 1 tablet (25 mg total) by mouth daily.     Cardiovascular: Diuretics - Aldosterone Antagonist Failed - 12/01/2021  5:05 PM      Failed - Cr in normal range and within 180 days    Creat  Date Value Ref Range Status  11/12/2021 1.73 (H) 0.70 - 1.35 mg/dL Final   Creatinine, Urine  Date Value Ref Range Status  08/30/2021 38.98 mg/dL Final    Comment:    Performed at [facility], [place] [address], [place], [place] [address]         Failed - Na in normal range and within 180 days    Sodium  Date Value Ref Range Status  11/12/2021 134 (L) 135 - 146 mmol/L Final  06/16/2021 131 (L) 134 - 144 mmol/L Final         Failed - Valid encounter within  last 6 months    Recent Outpatient Visits           10 months ago Type 2 diabetes mellitus with hyperglycemia, with long-term current use of insulin [facility])   [place], Vernia Buff, NP   1 year ago Essential hypertension   [place], [place] W, NP   2 years ago Diabetes mellitus type 2, uncontrolled, with complications [facility])   [place], [place], [place]   3 years ago Diabetes mellitus type 2, uncontrolled, with complications [facility])   [place], Vernia Buff, NP       Future Appointments             In 2 weeks Thereasa Solo, Dionne Bucy, PA-C [place]   In 2 weeks Idolina Primer, Nedra Hai, PA-C [place]. [facility], [facility]   In 3 weeks Laurice Record, MD [facility], [facility]            Passed - K in normal range and within 180 days    Potassium  Date Value Ref Range Status  11/12/2021 4.6 3.5 - 5.3 mmol/L Final         Passed - eGFR is 30 or above and within 180 days    GFR  calc Af Amer  Date Value Ref Range Status  08/30/2019 >60 >60 mL/min Final   GFR, Estimated  Date Value Ref Range Status  09/05/2021 >60 >60 mL/min Final    Comment:    (NOTE) Calculated using the CKD-EPI Creatinine Equation (2021)    eGFR  Date Value Ref Range Status  11/12/2021 45 (L) > OR = 60 mL/min/1.40m Final  06/16/2021 70 >59 mL/min/1.73 Final         Passed - Last BP in normal range    BP Readings from Last 1 Encounters:  11/12/21 117/76         Refused Prescriptions Disp Refills   digoxin (LANOXIN) 0.125 MG tablet 30 tablet     Sig: Take 1 tablet (125 mcg total) by mouth once daily.     Cardiovascular:  Antiarrhythmic Agents - digoxin Failed - 12/01/2021  5:05 PM      Failed - Cr in normal range and within 180 days    Creat  Date Value Ref Range Status  11/12/2021 1.73 (H) 0.70 - 1.35 mg/dL Final   Creatinine, Urine  Date Value Ref Range Status  08/30/2021 38.98 mg/dL Final    Comment:    [facility] [address] [place] [address]        Failed - Digoxin (serum) in normal range and within 360 days    Digoxin Level  Date Value Ref Range Status  08/30/2021 <0.2 (L) 0.8 - 2.0 ng/mL Final    Comment:    RESULTS CONFIRMED BY MANUAL DILUTION Performed at [facility] [address] [place] [address]         Failed - Valid encounter within last 6 months    Recent Outpatient Visits           10 months ago Type 2 diabetes mellitus with hyperglycemia, with long-term current use of insulin ([facility]   [place] Z[place] Buff NP   1 year  ago Essential hypertension   [place], [place] W, NP   2 years ago Diabetes mellitus type 2, uncontrolled, with complications [facility])   [place], [place], [place]   3 years ago Diabetes mellitus type 2, uncontrolled, with complications [facility])   [place], Vernia Buff, NP       Future Appointments             In 2 weeks Thereasa Solo, Dionne Bucy, PA-C [place]   In 2 weeks Idolina Primer, Nedra Hai, PA-C [place]. [facility], [facility]   In 3 weeks Laurice Record, MD [facility], [facility]            Passed - Ca in normal range and within 360 days    Calcium  Date Value Ref Range Status  11/12/2021 9.7 8.6 - 10.3 mg/dL Final         Passed - K in normal range and within 180 days    Potassium  Date Value Ref Range Status  11/12/2021 4.6 3.5 - 5.3 mmol/L Final         Passed - Mg Level in normal range and within 360 days    Magnesium  Date Value Ref Range Status  08/30/2021 2.0 1.7 - 2.4 mg/dL Final    Comment:    Performed at [facility], [place] [address], [place], [place] [address]         Passed - Patient had ECG in the last 360 days      Passed - Last Heart Rate in normal range    Pulse Readings from Last 1 Encounters:  11/12/21 98

## 2021-12-02 NOTE — Telephone Encounter (Signed)
Requested Prescriptions  Pending Prescriptions Disp Refills  . atorvastatin (LIPITOR) 80 MG tablet 30 tablet     Sig: Take 1 tablet (80 mg total) by mouth daily.     Cardiovascular:  Antilipid - Statins Failed - 12/01/2021  5:05 PM      Failed - Lipid Panel in normal range within the last 12 months    Cholesterol, Total  Date Value Ref Range Status  01/08/2021 233 (H) 100 - 199 mg/dL Final   Cholesterol  Date Value Ref Range Status  09/03/2021 151 0 - 200 mg/dL Final   LDL Chol Calc (NIH)  Date Value Ref Range Status  01/08/2021 122 (H) 0 - 99 mg/dL Final   LDL Cholesterol  Date Value Ref Range Status  09/03/2021 78 0 - 99 mg/dL Final    Comment:           Total Cholesterol/HDL:CHD Risk Coronary Heart Disease Risk Table                     Men   Women  1/2 Average Risk   3.4   3.3  Average Risk       5.0   4.4  2 X Average Risk   9.6   7.1  3 X Average Risk  23.4   11.0        Use the calculated Patient Ratio above and the CHD Risk Table to determine the patient's CHD Risk.        ATP III CLASSIFICATION (LDL):  <100     mg/dL   Optimal  100-129  mg/dL   Near or Above                    Optimal  130-159  mg/dL   Borderline  160-189  mg/dL   High  >190     mg/dL   Very High Performed at [facility] [address], [place], [place] [address]    HDL  Date Value Ref Range Status  09/03/2021 23 (L) >40 mg/dL Final  01/08/2021 38 (L) >39 mg/dL Final   Triglycerides  Date Value Ref Range Status  09/03/2021 248 (H) <150 mg/dL Final         Passed - Patient is not pregnant      Passed - Valid encounter within last 12 months    Recent Outpatient Visits          10 months ago Type 2 diabetes mellitus with hyperglycemia, with long-term current use of insulin ([place])   [place], Vernia Buff, NP   1 year ago Essential hypertension   [place], [place] W, NP   2 years ago Diabetes  mellitus type 2, uncontrolled, with complications [facility])   [place], [place], [place]   3 years ago Diabetes mellitus type 2, uncontrolled, with complications [facility])   [place], Vernia Buff, NP      Future Appointments            In 2 weeks Thereasa Solo, Dionne Bucy, PA-C [place]   In 2 weeks Idolina Primer, Nedra Hai, PA-C [place] A Dept Of [place]. [facility], [facility]   In 3 weeks Laurice Record, MD [facility] for Infectious Disease, [facility]           .  carvedilol (COREG) 3.125 MG tablet 60 tablet     Sig: Take 1 tablet (3.125 mg total) by mouth 2 (two) times daily with a meal.     Cardiovascular: Beta Blockers 3 Failed - 12/01/2021  5:05 PM      Failed - Cr in normal range and within 360 days    Creat  Date Value Ref Range Status  11/12/2021 1.73 (H) 0.70 - 1.35 mg/dL Final   Creatinine, Urine  Date Value Ref Range Status  08/30/2021 38.98 mg/dL Final    Comment:    Performed at [facility], [place] [address] [place], [place] [address]         Failed - Valid encounter within last 6 months    Recent Outpatient Visits          10 months ago Type 2 diabetes mellitus with hyperglycemia, with long-term current use of insulin [facility])   [place], Vernia Buff, NP   1 year ago Essential hypertension   [place], [place] W, NP   2 years ago Diabetes mellitus type 2, uncontrolled, with complications [facility])   [place], [place], [place]   3 years ago Diabetes mellitus type 2, uncontrolled, with complications [facility])   [place], Vernia Buff, NP      Future Appointments            In 2 weeks Thereasa Solo, Dionne Bucy, PA-C [place]   In 2 weeks Idolina Primer, Nedra Hai, PA-C [place]. [facility], [facility]   In 3 weeks Laurice Record, MD [facility] for Infectious Disease, [facility]           Passed - AST in normal range and within 360 days    AST  Date Value Ref Range Status  11/12/2021 19 10 - 35 U/L Final         Passed - ALT in normal range and within 360 days    ALT  Date Value Ref Range Status  11/12/2021 19 9 - 46 U/L Final         Passed - Last BP in normal range    BP Readings from Last 1 Encounters:  11/12/21 117/76         Passed - Last Heart Rate in normal range    Pulse Readings from Last 1 Encounters:  11/12/21 98         . clopidogrel (PLAVIX) 75 MG tablet 30 tablet     Sig: Take 1 tablet (75 mg total) by mouth daily with breakfast.     Hematology: Antiplatelets - clopidogrel Failed - 12/01/2021  5:05 PM      Failed - Cr in normal range and within 360 days    Creat  Date Value Ref Range Status  11/12/2021 1.73 (H) 0.70 - 1.35 mg/dL Final   Creatinine, Urine  Date Value Ref Range Status  08/30/2021 38.98 mg/dL Final    Comment:    Performed at [facility], [place] [address], [place], [place] [address]         Failed - Valid encounter within last 6 months    Recent Outpatient Visits          10 months ago Type 2 diabetes mellitus with hyperglycemia, with long-term current use of insulin ([place])  [place] Gildardo Pounds, NP   1 year ago Essential hypertension   [place], [place] W, NP   2 years ago Diabetes mellitus type 2, uncontrolled, with complications [facility])   [place], [place], [place]   3 years ago Diabetes mellitus type 2, uncontrolled, with complications [facility])   [place], Vernia Buff, NP      Future Appointments            In 2 weeks Thereasa Solo, Dionne Bucy, PA-C [place]    In 2 weeks Idolina Primer, Nedra Hai, PA-C [place]. [facility], [facility]   In 3 weeks Laurice Record, MD [facility] for Infectious Disease, [facility]           Passed - HCT in normal range and within 180 days    HCT  Date Value Ref Range Status  11/12/2021 44.1 38.5 - 50.0 % Final   Hematocrit  Date Value Ref Range Status  06/16/2021 47.0 37.5 - 51.0 % Final         Passed - HGB in normal range and within 180 days    Hemoglobin  Date Value Ref Range Status  11/12/2021 15.4 13.2 - 17.1 g/dL Final  06/16/2021 17.3 13.0 - 17.7 g/dL Final         Passed - PLT in normal range and within 180 days    Platelets  Date Value Ref Range Status  11/12/2021 261 140 - 400 Thousand/uL Final  06/16/2021 244 150 - 450 x10E3/uL Final         . furosemide (LASIX) 40 MG tablet 30 tablet     Sig: Take 1 tablet (40 mg total) by mouth daily.     Cardiovascular:  Diuretics - Loop Failed - 12/01/2021  5:05 PM      Failed - Na in normal range and within 180 days    Sodium  Date Value Ref Range Status  11/12/2021 134 (L) 135 - 146 mmol/L Final  06/16/2021 131 (L) 134 - 144 mmol/L Final         Failed - Cr in normal range and within 180 days    Creat  Date Value Ref Range Status  11/12/2021 1.73 (H) 0.70 - 1.35 mg/dL Final   Creatinine, Urine  Date Value Ref Range Status  08/30/2021 38.98 mg/dL Final    Comment:    Performed at [facility], [place] [address] [place], [place] [address]         Failed - Valid encounter within last 6 months    Recent Outpatient Visits          10 months ago Type 2 diabetes mellitus with hyperglycemia, with long-term current use of insulin [facility])   [place], Vernia Buff, NP   1 year ago Essential hypertension   [place], [place] W, NP   2 years ago Diabetes mellitus type 2, uncontrolled, with complications [facility])   [place], [place], [place]   3 years ago Diabetes mellitus type 2, uncontrolled, with complications [facility])   [place] Gildardo Pounds, NP      Future Appointments  In 2 weeks Thereasa Solo, Dionne Bucy, PA-C [place]   In 2 weeks Idolina Primer, Nedra Hai, PA-C [facility], [facility]   In 3 weeks Laurice Record, MD [facility] for Infectious Disease, [facility]           Passed - K in normal range and within 180 days    Potassium  Date Value Ref Range Status  11/12/2021 4.6 3.5 - 5.3 mmol/L Final         Passed - Ca in normal range and within 180 days    Calcium  Date Value Ref Range Status  11/12/2021 9.7 8.6 - 10.3 mg/dL Final         Passed - Cl in normal range and within 180 days    Chloride  Date Value Ref Range Status  11/12/2021 100 98 - 110 mmol/L Final         Passed - Mg Level in normal range and within 180 days    Magnesium  Date Value Ref Range Status  08/30/2021 2.0 1.7 - 2.4 mg/dL Final    Comment:    Performed at [facility], [place] [address], [place], [place] [address]         Passed - Last BP in normal range    BP Readings from Last 1 Encounters:  11/12/21 117/76         . spironolactone (ALDACTONE) 25 MG tablet 90 tablet     Sig: Take 1 tablet (25 mg total) by mouth daily.     Cardiovascular: Diuretics - Aldosterone Antagonist Failed - 12/01/2021  5:05 PM      Failed - Cr in normal range and within 180 days    Creat  Date Value Ref Range Status  11/12/2021 1.73 (H) 0.70 - 1.35 mg/dL Final   Creatinine, Urine  Date Value Ref Range Status  08/30/2021 38.98 mg/dL Final    Comment:    Performed at [facility], [place] [address] [place], [place] [address]         Failed - Na in normal range and within 180 days    Sodium  Date Value Ref Range Status  11/12/2021 134 (L) 135 -  146 mmol/L Final  06/16/2021 131 (L) 134 - 144 mmol/L Final         Failed - Valid encounter within last 6 months    Recent Outpatient Visits          10 months ago Type 2 diabetes mellitus with hyperglycemia, with long-term current use of insulin [facility])   [place], Vernia Buff, NP   1 year ago Essential hypertension   [place], [place] W, NP   2 years ago Diabetes mellitus type 2, uncontrolled, with complications [facility])   [place], [place], [place]   3 years ago Diabetes mellitus type 2, uncontrolled, with complications [facility])   [place], Vernia Buff, NP      Future Appointments            In 2 weeks Thereasa Solo, Dionne Bucy, PA-C [place]   In 2 weeks Idolina Primer, Nedra Hai, PA-C [place] A Dept Of [place]. [facility], [facility]   In 3 weeks Laurice Record, MD [facility] for Infectious Disease, [facility]           Passed - K in normal range and within 180 days    Potassium  Date Value Ref Range Status  11/12/2021 4.6 3.5 - 5.3 mmol/L Final         Passed - eGFR is 30 or above and within 180 days    GFR calc Af Amer  Date Value Ref Range Status  08/30/2019 >60 >60 mL/min Final   GFR, Estimated  Date Value Ref Range Status  09/05/2021 >60 >60 mL/min Final    Comment:    (NOTE) Calculated using the CKD-EPI Creatinine Equation (2021)    eGFR  Date Value Ref Range Status  11/12/2021 45 (L) > OR = 60 mL/min/1.18m2 Final  06/16/2021 70 >59 mL/min/1.73 Final         Passed - Last BP in normal range    BP Readings from Last 1 Encounters:  11/12/21 117/76         Refused Prescriptions Disp Refills  . digoxin (LANOXIN) 0.125 MG tablet 30 tablet     Sig: Take 1 tablet (125 mcg total) by mouth once daily.     Cardiovascular:  Antiarrhythmic Agents - digoxin  Failed - 12/01/2021  5:05 PM      Failed - Cr in normal range and within 180 days    Creat  Date Value Ref Range Status  11/12/2021 1.73 (H) 0.70 - 1.35 mg/dL Final   Creatinine, Urine  Date Value Ref Range Status  08/30/2021 38.98 mg/dL Final    Comment:    Performed at [facility], [place] [address], [place], [place] [address]         Failed - Digoxin (serum) in normal range and within 360 days    Digoxin Level  Date Value Ref Range Status  08/30/2021 <0.2 (L) 0.8 - 2.0 ng/mL Final    Comment:    RESULTS CONFIRMED BY MANUAL DILUTION Performed at [facility], [place] [address], [place], [place] [address]          Failed - Valid encounter within last 6 months    Recent Outpatient Visits          10 months ago Type 2 diabetes mellitus with hyperglycemia, with long-term current use of insulin [facility])   [place], Vernia Buff, NP   1 year ago Essential hypertension   [place], [place] W, NP   2 years ago Diabetes mellitus type 2, uncontrolled, with complications [facility])   [place], [place], [place]   3 years ago Diabetes mellitus type 2, uncontrolled, with complications [facility])   [place], Vernia Buff, NP      Future Appointments            In 2 weeks Thereasa Solo, Dionne Bucy, PA-C [place]   In 2 weeks Idolina Primer, Nedra Hai, PA-C [place]. [facility], [facility]   In 3 weeks Laurice Record, MD [facility] for Infectious Disease, [facility]           Passed - Ca in normal range and within 360 days    Calcium  Date Value Ref Range Status  11/12/2021 9.7 8.6 - 10.3 mg/dL Final         Passed - K in  normal range and within 180 days    Potassium  Date Value Ref Range Status  11/12/2021 4.6 3.5 - 5.3 mmol/L Final         Passed - Mg Level in  normal range and within 360 days    Magnesium  Date Value Ref Range Status  08/30/2021 2.0 1.7 - 2.4 mg/dL Final    Comment:    Performed at [name], [place] [address] [place], [place] [address]         Passed - Patient had ECG in the last 360 days      Passed - Last Heart Rate in normal range    Pulse Readings from Last 1 Encounters:  11/12/21 98

## 2021-12-07 ENCOUNTER — Other Ambulatory Visit: Payer: Self-pay

## 2021-12-07 ENCOUNTER — Other Ambulatory Visit: Payer: Self-pay | Admitting: Family Medicine

## 2021-12-07 DIAGNOSIS — E1165 Type 2 diabetes mellitus with hyperglycemia: Secondary | ICD-10-CM

## 2021-12-07 NOTE — Telephone Encounter (Signed)
Will forward to provider to make rx change

## 2021-12-08 ENCOUNTER — Other Ambulatory Visit: Payer: Self-pay

## 2021-12-08 ENCOUNTER — Telehealth: Payer: Self-pay | Admitting: Family

## 2021-12-08 MED ORDER — HYDROCODONE-ACETAMINOPHEN 5-325 MG PO TABS: 1.0000 | ORAL_TABLET | Freq: Two times a day (BID) | ORAL | 0 refills | Status: DC | PRN

## 2021-12-08 NOTE — Telephone Encounter (Signed)
Pt called requesting a refill of hydrocodone. Please send to pharmacy on file. Pt phone number is [phone]

## 2021-12-08 NOTE — Telephone Encounter (Signed)
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 Requesting refill of Hydrocodone please advise.

## 2021-12-14 ENCOUNTER — Other Ambulatory Visit: Payer: Self-pay | Admitting: Pharmacist

## 2021-12-14 ENCOUNTER — Other Ambulatory Visit: Payer: Self-pay

## 2021-12-14 ENCOUNTER — Ambulatory Visit: Payer: Self-pay | Attending: Internal Medicine

## 2021-12-14 DIAGNOSIS — Z794 Long term (current) use of insulin: Secondary | ICD-10-CM

## 2021-12-14 DIAGNOSIS — Z79899 Other long term (current) drug therapy: Secondary | ICD-10-CM

## 2021-12-14 DIAGNOSIS — E7800 Pure hypercholesterolemia, unspecified: Secondary | ICD-10-CM

## 2021-12-14 MED ORDER — INSULIN ASPART 100 UNIT/ML FLEXPEN
15.0000 [IU] | PEN_INJECTOR | Freq: Three times a day (TID) | SUBCUTANEOUS | 0 refills | Status: DC
  Filled 2021-12-14: qty 3, 7d supply, fill #0

## 2021-12-14 MED ORDER — BASAGLAR KWIKPEN 100 UNIT/ML ~~LOC~~ SOPN
40.0000 [IU] | PEN_INJECTOR | Freq: Every day | SUBCUTANEOUS | 0 refills | Status: DC
  Filled 2021-12-14: qty 3, 7d supply, fill #0

## 2021-12-14 MED ORDER — INSULIN LISPRO (1 UNIT DIAL) 100 UNIT/ML (KWIKPEN)
15.0000 [IU] | PEN_INJECTOR | Freq: Three times a day (TID) | SUBCUTANEOUS | 0 refills | Status: DC
  Filled 2021-12-14: qty 3, 7d supply, fill #0

## 2021-12-14 NOTE — Telephone Encounter (Signed)
Pt called to report that he is completely out of his insulin both the novolog and the basaglar  Wants to use [facility] pharmacy, he says the sooner the better he is currently at [facility]

## 2021-12-14 NOTE — Addendum Note (Signed)
Addended by: Nena Polio on: 12/14/2021 12:02 PM   Modules accepted: Orders

## 2021-12-14 NOTE — Telephone Encounter (Signed)
insulin aspart (NOVOLOG) 100 UNIT/ML FlexPen Insulin Glargine (BASAGLAR KWIKPEN) 100 UNIT/ML  [facility] at [facility]  [address] [place] [address]  Phone: [phone] Fax: [phone]    See note below

## 2021-12-14 NOTE — Telephone Encounter (Signed)
rxs were sent to pharmacy by Franky Macho, [facility] today. Requested Prescriptions  Pending Prescriptions Disp Refills  . Insulin Glargine (BASAGLAR KWIKPEN) 100 UNIT/ML 3 mL 0    Sig: Inject 40 Units into the skin daily.     Endocrinology:  Diabetes - Insulins Failed - 12/14/2021 12:02 PM      Failed - HBA1C is between 0 and 7.9 and within 180 days    Hgb A1c MFr Bld  Date Value Ref Range Status  08/30/2021 11.2 (H) 4.8 - 5.6 % Final    Comment:    (NOTE) Pre diabetes:          5.7%-6.4%  Diabetes:              >6.4%  Glycemic control for   <7.0% adults with diabetes          Failed - Valid encounter within last 6 months    Recent Outpatient Visits          11 months ago Type 2 diabetes mellitus with hyperglycemia, with long-term current use of insulin (HCC)   [place] [facility] [place], Shea Stakes, NP   1 year ago Essential hypertension   [facility] [facility] [place], [place] W, NP   2 years ago Diabetes mellitus type 2, uncontrolled, with complications [place])   [place] [facility] [place], [place], [place]   3 years ago Diabetes mellitus type 2, uncontrolled, with complications [facility])   [place] [facility] Claiborne Rigg, NP      Future Appointments            In 2 days Nadara Mustard, MD [facility] [place]   In 2 days [place], [place] Organ [facility]   In 6 days [place], Tacey Ruiz, PA-C [place] [facility] [address] A Dept Of [place]. [facility], [facility]   In 1 week Danelle Earthly, MD [facility] for Infectious Disease, [facility]           . insulin lispro (HUMALOG KWIKPEN) 100 UNIT/ML KwikPen 3 mL 0    Sig: Inject 15 Units into the skin 3 (three) times daily.     Endocrinology:  Diabetes - Insulins Failed - 12/14/2021 12:02 PM      Failed - HBA1C is between 0 and 7.9 and within 180 days    Hgb A1c MFr Bld  Date Value Ref Range Status   08/30/2021 11.2 (H) 4.8 - 5.6 % Final    Comment:    (NOTE) Pre diabetes:          5.7%-6.4%  Diabetes:              >6.4%  Glycemic control for   <7.0% adults with diabetes          Failed - Valid encounter within last 6 months    Recent Outpatient Visits          11 months ago Type 2 diabetes mellitus with hyperglycemia, with long-term current use of insulin [facility])   [place] [facility] [place], Shea Stakes, NP   1 year ago Essential hypertension   [place] [address] [facility] [place], [place] W, NP   2 years ago Diabetes mellitus type 2, uncontrolled, with complications [facility])   [facility] [place], [place], [place]   3 years ago Diabetes mellitus type 2, uncontrolled, with complications (  [facility])   [place] [facility] Claiborne Rigg, NP      Future Appointments            In 2 days Nadara Mustard, MD [facility]   In 2 days [place] Organ [facility]   In 6 days Shea Evans, Tacey Ruiz, PA-C [place] [facility] [address] A Dept Of [place]. [facility], [facility]   In 1 week Danelle Earthly, MD [facility] for Infectious Disease, [facility]           Refused Prescriptions Disp Refills  . insulin aspart (NOVOLOG FLEXPEN) 100 UNIT/ML FlexPen 15 mL 0    Sig: Inject 15 Units into the skin 3 (three) times daily with meals.     Endocrinology:  Diabetes - Insulins Failed - 12/14/2021 12:02 PM      Failed - HBA1C is between 0 and 7.9 and within 180 days    Hgb A1c MFr Bld  Date Value Ref Range Status  08/30/2021 11.2 (H) 4.8 - 5.6 % Final    Comment:    (NOTE) Pre diabetes:          5.7%-6.4%  Diabetes:              >6.4%  Glycemic control for   <7.0% adults with diabetes          Failed - Valid encounter within last 6 months    Recent Outpatient Visits          11 months ago Type 2 diabetes mellitus with hyperglycemia, with  long-term current use of insulin (HCC)   [place] [facility] [place], Shea Stakes, NP   1 year ago Essential hypertension   [place] [facility] [place], [place] W, NP   2 years ago Diabetes mellitus type 2, uncontrolled, with complications [facility])   [place] [facility] And Wellness [place], [place], [place]   3 years ago Diabetes mellitus type 2, uncontrolled, with complications [facility])   [place] [facility] Claiborne Rigg, NP      Future Appointments            In 2 days Nadara Mustard, MD [facility] [place]   In 2 days [place], [place] Organ [facility] Community Health And Wellness   In 6 days Dunn, Tacey Ruiz, PA-C  [facility] [address] A Dept Of [place]. [facility], [facility]   In 1 week Danelle Earthly, MD [facility] for Infectious Disease, [facility]

## 2021-12-15 LAB — LIPID PANEL
Chol/HDL Ratio: 5.9 ratio — ABNORMAL HIGH (ref 0.0–5.0)
Cholesterol, Total: 213 mg/dL — ABNORMAL HIGH (ref 100–199)
HDL: 36 mg/dL — ABNORMAL LOW (ref >39)
LDL Chol Calc (NIH): 142 mg/dL — ABNORMAL HIGH (ref 0–99)
Triglycerides: 191 mg/dL — ABNORMAL HIGH (ref 0–149)
VLDL Cholesterol Cal: 35 mg/dL (ref 5–40)

## 2021-12-15 LAB — HEMOGLOBIN A1C
Est. average glucose Bld gHb Est-mCnc: 206 mg/dL
Hgb A1c MFr Bld: 8.8 % — ABNORMAL HIGH (ref 4.8–5.6)

## 2021-12-16 ENCOUNTER — Ambulatory Visit (INDEPENDENT_AMBULATORY_CARE_PROVIDER_SITE_OTHER): Payer: Self-pay

## 2021-12-16 ENCOUNTER — Other Ambulatory Visit: Payer: Self-pay

## 2021-12-16 ENCOUNTER — Ambulatory Visit: Payer: Self-pay | Attending: Nurse Practitioner | Admitting: Physician Assistant

## 2021-12-16 ENCOUNTER — Encounter: Payer: Self-pay | Admitting: Orthopedic Surgery

## 2021-12-16 ENCOUNTER — Encounter: Payer: Self-pay | Admitting: Physician Assistant

## 2021-12-16 ENCOUNTER — Ambulatory Visit (INDEPENDENT_AMBULATORY_CARE_PROVIDER_SITE_OTHER): Payer: Self-pay | Admitting: Orthopedic Surgery

## 2021-12-16 VITALS — BP 137/100 | HR 99 | Ht 72.0 in | Wt 185.4 lb

## 2021-12-16 DIAGNOSIS — M79671 Pain in right foot: Secondary | ICD-10-CM

## 2021-12-16 DIAGNOSIS — E1142 Type 2 diabetes mellitus with diabetic polyneuropathy: Secondary | ICD-10-CM

## 2021-12-16 DIAGNOSIS — Z794 Long term (current) use of insulin: Secondary | ICD-10-CM

## 2021-12-16 DIAGNOSIS — I429 Cardiomyopathy, unspecified: Secondary | ICD-10-CM

## 2021-12-16 DIAGNOSIS — I480 Paroxysmal atrial fibrillation: Secondary | ICD-10-CM

## 2021-12-16 DIAGNOSIS — F419 Anxiety disorder, unspecified: Secondary | ICD-10-CM

## 2021-12-16 DIAGNOSIS — E1165 Type 2 diabetes mellitus with hyperglycemia: Secondary | ICD-10-CM

## 2021-12-16 DIAGNOSIS — I96 Gangrene, not elsewhere classified: Secondary | ICD-10-CM

## 2021-12-16 DIAGNOSIS — Z7901 Long term (current) use of anticoagulants: Secondary | ICD-10-CM

## 2021-12-16 LAB — GLUCOSE, POCT (MANUAL RESULT ENTRY): POC Glucose: 349 mg/dl — AB (ref 70–99)

## 2021-12-16 MED ORDER — INSULIN ASPART 100 UNIT/ML IJ SOLN: 15.0000 [IU] | Freq: Once | INTRAMUSCULAR | Status: DC

## 2021-12-16 MED ORDER — SPIRONOLACTONE 25 MG PO TABS
25.0000 mg | ORAL_TABLET | Freq: Every day | ORAL | 4 refills | Status: DC
  Filled 2021-12-16 – 2022-01-17 (×2): qty 30, 30d supply, fill #0
  Filled 2022-02-15: qty 30, 30d supply, fill #1
  Filled 2022-02-16: qty 2, 2d supply, fill #1
  Filled 2022-02-16: qty 28, 28d supply, fill #1
  Filled 2022-03-18 (×2): qty 30, 30d supply, fill #2
  Filled 2022-04-13 – 2022-05-16 (×2): qty 30, 30d supply, fill #3
  Filled 2022-06-20: qty 30, 30d supply, fill #4
  Filled ????-??-??: fill #4
  Filled ????-??-??: fill #1

## 2021-12-16 MED ORDER — BUSPIRONE HCL 10 MG PO TABS
10.0000 mg | ORAL_TABLET | Freq: Two times a day (BID) | ORAL | 3 refills | Status: DC
  Filled 2021-12-16: qty 60, 30d supply, fill #0
  Filled 2022-01-17: qty 60, 30d supply, fill #1
  Filled 2022-04-15: qty 60, 30d supply, fill #2
  Filled 2022-05-16: qty 60, 30d supply, fill #3
  Filled ????-??-??: fill #2

## 2021-12-16 MED ORDER — CARVEDILOL 3.125 MG PO TABS
3.1250 mg | ORAL_TABLET | Freq: Two times a day (BID) | ORAL | 3 refills | Status: DC
  Filled 2021-12-16 – 2022-01-17 (×2): qty 60, 30d supply, fill #0
  Filled 2022-02-15: qty 60, 30d supply, fill #1
  Filled 2022-03-18 (×2): qty 60, 30d supply, fill #2
  Filled 2022-04-13 (×2): qty 60, 30d supply, fill #3
  Filled ????-??-??: fill #1

## 2021-12-16 MED ORDER — INSULIN LISPRO (1 UNIT DIAL) 100 UNIT/ML (KWIKPEN)
15.0000 [IU] | PEN_INJECTOR | Freq: Three times a day (TID) | SUBCUTANEOUS | 3 refills | Status: DC
  Filled 2021-12-16: qty 9, 20d supply, fill #0
  Filled 2022-01-17: qty 9, 20d supply, fill #1
  Filled 2022-02-15: qty 9, 20d supply, fill #2
  Filled 2022-03-18 (×2): qty 9, 20d supply, fill #3

## 2021-12-16 MED ORDER — BASAGLAR KWIKPEN 100 UNIT/ML ~~LOC~~ SOPN
40.0000 [IU] | PEN_INJECTOR | Freq: Every day | SUBCUTANEOUS | 3 refills | Status: DC
  Filled 2021-12-16: qty 9, 22d supply, fill #0
  Filled 2022-01-17: qty 9, 22d supply, fill #1
  Filled 2022-02-15: qty 9, 22d supply, fill #2
  Filled 2022-03-18 (×2): qty 9, 22d supply, fill #3

## 2021-12-16 MED ORDER — FUROSEMIDE 40 MG PO TABS
40.0000 mg | ORAL_TABLET | Freq: Every day | ORAL | 3 refills | Status: DC
  Filled 2021-12-16 – 2022-01-17 (×2): qty 30, 30d supply, fill #0
  Filled 2022-02-15: qty 30, 30d supply, fill #1
  Filled 2022-03-18 (×2): qty 30, 30d supply, fill #2
  Filled 2022-04-13 (×3): qty 30, 30d supply, fill #3
  Filled ????-??-??: fill #1

## 2021-12-16 MED ORDER — ATORVASTATIN CALCIUM 80 MG PO TABS
80.0000 mg | ORAL_TABLET | Freq: Every day | ORAL | 3 refills | Status: DC
  Filled 2021-12-16 – 2022-01-17 (×2): qty 30, 30d supply, fill #0
  Filled 2022-02-15: qty 30, 30d supply, fill #1
  Filled 2022-03-18 (×2): qty 30, 30d supply, fill #2
  Filled 2022-04-13 (×2): qty 30, 30d supply, fill #3
  Filled ????-??-??: fill #1

## 2021-12-16 NOTE — Progress Notes (Signed)
Patient ID: David [name], male   DOB: 1960-09-07, 61 y.o.   MRN: [id number]    David [name], is a 61 y.o. male  NWG:[id number]  [id number]:[id number]  DOB - 1961/02/03  Chief Complaint  Patient presents with   Hospitalization Follow-up       Subjective:   Nature [name] is a 61 y.o. male here today for med Rf.  He saw Dr [name] this morning and there is a concern for gangrene.  Urgent consult with endovascular is being made for R foot.    Not regularly checking blood sugars.  Has missed last several appts here.  Last seen 01/2021.  Says he is compliant with meds including both insulins (but wants to "get off insulin.").  sees cardiology next week.  No CP/HA/SOB.  Recent A1C=8.8  C/o anxiety-"valium" has worked in the past says his mom used to give it to him.  Was given hydroxyzine at last appt and says it made him sleepy.  He is a Investment banker, operational.  Denies SI/HI.  wants to "get his health straight"  From Dr [name] appt today:  HPI: Patient is a 61 year old gentleman who is over 3 months out from a right foot fourth ray amputation.  Patient has been full weightbearing at work standing on his feet.  Patient states that he has had an acute increase in pain in the foot with pain over the dorsum and lateral aspect of his foot.   Assessment & Plan: Visit Diagnoses:  1. Pain in right foot   2. Gangrene of right foot (HCC)       Plan: Patient has acute ischemic changes with ulceration beneath the fifth metatarsal head as well as ischemic changes to the fourth ray amputation incision.  Will consult vascular surgery for urgent consultation.  Patient is status post endovascular intervention to the right lower extremity June 1.  No problems updated.  ALLERGIES: No Known Allergies  PAST MEDICAL HISTORY: Past Medical History:  Diagnosis Date   Cardiomyopathy (HCC)    a. EF 45% in 2019.   CHF (congestive heart failure) (HCC)    Chronic anticoagulation 09/30/2017   Diabetes mellitus type 2 in nonobese [facility])    Does  not have health insurance    Essential hypertension 09/26/2017   Financial difficulties    H/O noncompliance with medical treatment, presenting hazards to health    Non-insulin treated type 2 diabetes mellitus (HCC) 09/26/2017   Persistent atrial fibrillation (HCC)    Sleep apnea suspected 09/30/2017    MEDICATIONS AT HOME: Prior to Admission medications   Medication Sig Start Date End Date Taking? Authorizing Provider  busPIRone (BUSPAR) 10 MG tablet Take 1 tablet (10 mg total) by mouth 2 (two) times daily. 12/16/21  Yes Celine Dishman, Marzella Schlein, PA-C  apixaban (ELIQUIS) 5 MG TABS tablet Take 1 tablet (5 mg total) by mouth 2 (two) times daily. 05/10/21   Tereso Newcomer T, PA-C  aspirin EC 81 MG tablet Take 1 tablet (81 mg total) by mouth daily. Swallow whole. 09/07/21   Burnadette Pop, MD  atorvastatin (LIPITOR) 80 MG tablet Take 1 tablet (80 mg total) by mouth daily. 12/16/21   Anders Simmonds, PA-C  carvedilol (COREG) 3.125 MG tablet Take 1 tablet (3.125 mg total) by mouth 2 (two) times daily with a meal. 12/16/21   Yazleen Molock, Marzella Schlein, PA-C  cetirizine (ZYRTEC) 10 MG tablet Take 1 tablet (10 mg total) by mouth at bedtime as needed for allergies. 09/22/21   Claiborne Rigg, NP  clopidogrel (  PLAVIX) 75 MG tablet Take 1 tablet (75 mg total) by mouth daily with breakfast. 12/02/21   [name], MD  digoxin (LANOXIN) 0.125 MG tablet Take 1 tablet (125 mcg total) by mouth once daily. 10/15/21   Tereso Newcomer T, PA-C  doxycycline (VIBRA-TABS) 100 MG tablet Take 1 tablet (100 mg total) by mouth 2 (two) times daily with a meal. 10/22/21   Danelle Earthly, MD  fluticasone (FLONASE) 50 MCG/ACT nasal spray Place 2 sprays into both nostrils daily. 01/08/21   Claiborne Rigg, NP  furosemide (LASIX) 40 MG tablet Take 1 tablet (40 mg total) by mouth daily. 12/16/21   Anders Simmonds, PA-C  HYDROcodone-acetaminophen (NORCO/VICODIN) 5-325 MG tablet Take 1 tablet by mouth every 12 (twelve) hours as needed for moderate  pain. 12/08/21   Adonis Huguenin, NP  Insulin Glargine (BASAGLAR KWIKPEN) 100 UNIT/ML Inject 40 Units into the skin daily. 12/16/21   Anders Simmonds, PA-C  insulin lispro (HUMALOG KWIKPEN) 100 UNIT/ML KwikPen Inject 15 Units into the skin 3 (three) times daily. 12/16/21   Anders Simmonds, PA-C  metroNIDAZOLE (FLAGYL) 500 MG tablet Take 1 tablet (500 mg total) by mouth 2 (two) times daily. 10/22/21   Danelle Earthly, MD  nitroGLYCERIN (NITRODUR - DOSED IN MG/24 HR) 0.2 mg/hr patch Place 1 patch (0.2 mg total) onto the skin daily. Patient not taking: Reported on 11/12/2021 10/22/21   Adonis Huguenin, NP  sacubitril-valsartan (ENTRESTO) 49-51 MG Take 1 tablet by mouth 2 (two) times daily. 09/10/21   [name], MD  spironolactone (ALDACTONE) 25 MG tablet Take 1 tablet (25 mg total) by mouth daily. 12/16/21 03/16/22  Anders Simmonds, PA-C    ROS: Neg HEENT Neg resp Neg cardiac Neg GI Neg GU Neg psych Neg neuro  Objective:   Vitals:   12/16/21 1519  BP: (!) 137/100  Pulse: 99  SpO2: 97%  Weight: 185 lb 6.4 oz (84.1 kg)  Height: 6' (1.829 m)   Exam General appearance : Awake, alert, not in any distress. Speech pressured and tangential.  Difficult to follow. Anxious affect HEENT: Atraumatic and Normocephalic Neck: Supple, no JVD. No cervical lymphadenopathy.  Chest: Good air entry bilaterally, CTAB.  No rales/rhonchi/wheezing CVS: S1 S2 regular, no murmurs.  Extremities: B/L Lower Ext shows no edema, both legs are warm to touch(foot/feet examined by [name] today Neurology: Awake alert, and oriented X 3, CN II-XII intact, Non focal Skin: No Rash  Data Review Lab Results  Component Value Date   HGBA1C 8.8 (H) 12/14/2021   HGBA1C 11.2 (H) 08/30/2021   HGBA1C 11.3 (A) 01/08/2021    Assessment & Plan   1. Type 2 diabetes mellitus with hyperglycemia, with long-term current use of insulin (HCC) Uncontrolled-did not take meds today.  He is about to eat lunch and take humalog.   Diabetic diet.  Check blood sugars fasting and bedtime.  - Glucose (CBG)- Insulin Glargine (BASAGLAR KWIKPEN) 100 UNIT/ML; Inject 40 Units into the skin daily.  Dispense: 9 mL; Refill: 3 - insulin lispro (HUMALOG KWIKPEN) 100 UNIT/ML KwikPen; Inject 15 Units into the skin 3 (three) times daily.  Dispense: 9 mL; Refill: 3 - atorvastatin (LIPITOR) 80 MG tablet; Take 1 tablet (80 mg total) by mouth daily.  Dispense: 30 tablet; Refill: 3  2. Diabetic peripheral neuropathy associated with type 2 diabetes mellitus (HCC)  3. AF (paroxysmal atrial fibrillation) (HCC) - carvedilol (COREG) 3.125 MG tablet; Take 1 tablet (3.125 mg total) by mouth 2 (two) times daily  with a meal.  Dispense: 60 tablet; Refill: 3  4. Chronic anticoagulation Followed by cardiology  5. Cardiomyopathy, unspecified type (HCC) Followed by cardiology - spironolactone (ALDACTONE) 25 MG tablet; Take 1 tablet (25 mg total) by mouth daily.  Dispense: 30 tablet; Refill: 4 - furosemide (LASIX) 40 MG tablet; Take 1 tablet (40 mg total) by mouth daily.  Dispense: 30 tablet; Refill: 3  6. Anxiety Self-care, deep breathing techniques.  Not interested in counseling at this time.  Denies SI/HI.   - busPIRone (BUSPAR) 10 MG tablet; Take 1 tablet (10 mg total) by mouth 2 (two) times daily.  Dispense: 60 tablet; Refill: 3  7. Right foot pain Followed by Dr [name]    Return in about 3 months (around 03/17/2022) for PCP (?Meredeth Ide or [place]) for chronic conditions.  The patient was given clear instructions to go to ER or return to medical center if symptoms don't improve, worsen or new problems develop. The patient verbalized understanding. The patient was told to call to get lab results if they haven't heard anything in the next week.      Georgian Co, PA-C [facility] [place], [place] [phone]   12/16/2021, 3:43 PM

## 2021-12-16 NOTE — Progress Notes (Signed)
Office Visit Note   Patient: David [name]           Date of Birth: November 20, 1960           MRN: [id number] Visit Date: 12/16/2021              Requested by: [name], MD [address] [place],  [place] [address] PCP: [name], MD  Chief Complaint  Patient presents with   Right Foot - Routine Post Op    09/07/21 right 4th ray amputation       HPI: Patient is a 61 year old gentleman who is over 3 months out from a right foot fourth ray amputation.  Patient has been full weightbearing at work standing on his feet.  Patient states that he has had an acute increase in pain in the foot with pain over the dorsum and lateral aspect of his foot.  Assessment & Plan: Visit Diagnoses:  1. Pain in right foot   2. Gangrene of right foot (HCC)     Plan: Patient has acute ischemic changes with ulceration beneath the fifth metatarsal head as well as ischemic changes to the fourth ray amputation incision.  Will consult vascular surgery for urgent consultation.  Patient is status post endovascular intervention to the right lower extremity June 1.  Follow-Up Instructions: Return in about 4 weeks (around 01/13/2022).   Ortho Exam  Patient is alert, oriented, no adenopathy, well-dressed, normal affect, normal respiratory effort. Examination patient has pain to palpation globally around the right foot.  I cannot palpate a pulse  The Doppler was used and I cannot Doppler a monophasic anterior tibial pulse I cannot Doppler a dorsalis pedis pulse.  Patient also has a monophasic posterior tibial pulse.  The dorsal incision over the fourth ray has had interval dehiscence and patient has an ischemic ulcer beneath the fifth metatarsal head that extends beneath the fourth ray.  Patient has no ascending cellulitis.  Patient's most recent hemoglobin A1c is 8.8.  Imaging: XR Foot 2 Views Right  Result Date: 12/16/2021 2 view radiographs of the right foot shows no calcified vessels no air  in the soft tissue previous fourth ray amputation site shows no lytic changes.  No images are attached to the encounter.  Labs: Lab Results  Component Value Date   HGBA1C 8.8 (H) 12/14/2021   HGBA1C 11.2 (H) 08/30/2021   HGBA1C 11.3 (A) 01/08/2021   ESRSEDRATE 14 11/12/2021   ESRSEDRATE 9 10/22/2021   CRP 1.4 11/12/2021   CRP 1.3 10/22/2021   REPTSTATUS 09/04/2021 FINAL 08/30/2021   REPTSTATUS 09/04/2021 FINAL 08/30/2021   CULT  08/30/2021    NO GROWTH 5 DAYS Performed at [facility], [address], [place], [place] [address]    CULT  08/30/2021    NO GROWTH 5 DAYS Performed at [facility], [address], [place], [place] [address]    LABORGA METHICILLIN RESISTANT STAPHYLOCOCCUS AUREUS 08/29/2021     Lab Results  Component Value Date   ALBUMIN 3.3 (L) 08/30/2021   ALBUMIN 3.3 (L) 08/29/2021   ALBUMIN 4.1 01/08/2021   PREALBUMIN 16.6 (L) 08/30/2021    Lab Results  Component Value Date   MG 2.0 08/30/2021   MG 1.6 (L) 08/29/2021   MG 1.8 10/19/2018   No results found for: "VD25OH"  Lab Results  Component Value Date   PREALBUMIN 16.6 (L) 08/30/2021      Latest Ref Rng & Units 11/12/2021    4:43  PM 10/22/2021    4:09 AM 09/04/2021    4:19 AM  CBC EXTENDED  WBC 3.8 - 10.8 Thousand/uL 8.0  7.5  10.4   RBC 4.20 - 5.80 Million/uL 4.76  5.06  4.39   Hemoglobin 13.2 - 17.1 g/dL 56.2  13.0  86.5   HCT 38.5 - 50.0 % 44.1  47.3  40.2   Platelets 140 - 400 Thousand/uL 261  265  240   NEUT# 1,500 - 7,800 cells/uL 4,792  4,095    Lymph# 850 - 3,900 cells/uL 1,968  2,123       There is no height or weight on file to calculate BMI.  Orders:  Orders Placed This Encounter  Procedures   XR Foot 2 Views Right   No orders of the defined types were placed in this encounter.    Procedures: No procedures performed  Clinical Data: No additional findings.  ROS:  All other systems negative, except as noted in the HPI. Review of  Systems  Objective: Vital Signs: There were no vitals taken for this visit.  Specialty Comments:  No specialty comments available.  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 EF 45% in 2019.   CHF (congestive heart failure) (HCC)    Chronic anticoagulation 09/30/2017   Diabetes mellitus type 2 in nonobese [facility])    Does not have health insurance    Essential hypertension 09/26/2017   Financial difficulties    H/O noncompliance with medical treatment, presenting hazards to health    Non-insulin treated type 2 diabetes mellitus (HCC) 09/26/2017   Persistent atrial fibrillation (HCC)    Sleep apnea suspected 09/30/2017    Family History  Problem Relation Age of Onset   Hypertension Mother     Past Surgical History:  Procedure Laterality Date   ABDOMINAL AORTOGRAM W/LOWER EXTREMITY N/A 09/02/2021   Procedure: ABDOMINAL AORTOGRAM W/LOWER EXTREMITY;  Surgeon: Nada Libman, MD;  Location: [facility] INVASIVE CV LAB;   Service: Cardiovascular;  Laterality: N/A;   AMPUTATION Right 09/04/2021   Procedure: 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;  Surgeon: Nadara Mustard, MD;  Location[facility];  Service: Orthopedics;  Laterality: Right;   APPENDECTOMY  1971   CARDIOVERSION N/A 09/28/2017   Procedure: CARDIOVERSION;  Surgeon: Thurmon Fair, MD;  Location: [facility] ENDOSCOPY;  Service: Cardiovascular;  Laterality: N/A;   PERIPHERAL VASCULAR BALLOON ANGIOPLASTY  09/02/2021   Procedure: PERIPHERAL VASCULAR BALLOON ANGIOPLASTY;  Surgeon: Nada Libman, MD;  Location: [facility] INVASIVE CV LAB;  Service: Cardiovascular;;   TEE WITHOUT CARDIOVERSION N/A 09/28/2017  Procedure: TRANSESOPHAGEAL ECHOCARDIOGRAM (TEE);  Surgeon: Thurmon Fair, MD;  Location: [facility] ENDOSCOPY;  Service: Cardiovascular;  Laterality: N/A;   TEE WITHOUT CARDIOVERSION N/A 09/03/2021   Procedure: TRANSESOPHAGEAL ECHOCARDIOGRAM (TEE);  Surgeon: Chilton Si, MD;  Location[facility] ENDOSCOPY;  Service: Cardiovascular;  Laterality: N/A;   Social History   Occupational History   Not on file  Tobacco Use   Smoking status: Former    Years: 15.00    Types: Cigarettes    Quit date: 03/18/2018    Years since quitting: 3.7   Smokeless tobacco: Never   Tobacco comments:    09/26/2017 "2-3 cigarettes/month now"  Vaping Use   Vaping Use: Never used  Substance and Sexual Activity   Alcohol use: Not Currently    Alcohol/week: 3.0 standard drinks of alcohol    Types: 3 Cans of beer per week    Comment: States he quit drinking 8-9 months ago   Drug use: Never   Sexual activity: Not Currently

## 2021-12-17 ENCOUNTER — Other Ambulatory Visit: Payer: Self-pay

## 2021-12-17 ENCOUNTER — Telehealth: Payer: Self-pay | Admitting: Orthopedic Surgery

## 2021-12-17 NOTE — Telephone Encounter (Signed)
09/07/21 right 4th ray amputation requesting refill on pain medication please advise.

## 2021-12-17 NOTE — Telephone Encounter (Signed)
Pt called requesting A refill of hydrocodone. Please send to pharmacy on file. Pt phone number is [phone].

## 2021-12-20 ENCOUNTER — Other Ambulatory Visit: Payer: Self-pay

## 2021-12-20 ENCOUNTER — Ambulatory Visit (INDEPENDENT_AMBULATORY_CARE_PROVIDER_SITE_OTHER): Payer: Self-pay | Admitting: Surgery

## 2021-12-20 ENCOUNTER — Ambulatory Visit
Admission: RE | Admit: 2021-12-20 | Discharge: 2021-12-20 | Disposition: A | Discharge status: Home / Self Care | Payer: Self-pay | Source: Ambulatory Visit | Attending: Vascular Surgery | Admitting: Vascular Surgery

## 2021-12-20 ENCOUNTER — Encounter: Payer: Self-pay | Admitting: Surgery

## 2021-12-20 ENCOUNTER — Ambulatory Visit: Payer: Self-pay | Admitting: Physician Assistant

## 2021-12-20 VITALS — BP 114/80 | HR 86 | Temp 98.0°F | Resp 20 | Ht 72.0 in | Wt 177.5 lb

## 2021-12-20 DIAGNOSIS — I739 Peripheral vascular disease, unspecified: Secondary | ICD-10-CM | POA: Insufficient documentation

## 2021-12-20 DIAGNOSIS — I7025 Atherosclerosis of native arteries of other extremities with ulceration: Secondary | ICD-10-CM

## 2021-12-20 MED ORDER — SULFAMETHOXAZOLE-TRIMETHOPRIM 800-160 MG PO TABS
1.0000 | ORAL_TABLET | Freq: Two times a day (BID) | ORAL | 0 refills | Status: DC
  Filled 2021-12-20: qty 28, 14d supply, fill #0

## 2021-12-20 NOTE — H&P (View-Only) (Signed)
[facility] [place]  Patient name: David [name] MRN: [id number] DOB: October 31, 1960 Sex: male     REASON FOR Visit:    Follow up  HISTORY OF PRESENT ILLNESS:   David [name] is a 61 y.o. male, who initially presented to the hospital in May 2023 with a diabetic foot infection involving the right fourth toe, which had been present for about 3 weeks.  The patient did not realize he had a wound because he does not have feeling in his feet.  On 09/02/2021, he underwent angiography and was found to have single-vessel runoff via the peroneal artery.  I was able to recanalize his posterior tibial artery.  On 09/04/2021, he underwent fourth toe ray amputation by Dr. [name].  There was good petechial bleeding at the time of surgery.  Fortunately, his wound has never really healed.  He has developed blister on the bottom of his foot.  He is complaining of a fair amount of pain as well as some redness.  The patient suffers from type 2 diabetes.  He is on Eliquis for atrial fibrillation.  He suffers from congestive heart failure with an ejection fraction of 45%.  He is medically managed for hypertension and takes a statin for hypercholesterolemia.  He is a former smoker.  PAST MEDICAL HISTORY    Past Medical History:  Diagnosis Date   Cardiomyopathy (HCC)    a. EF 45% in 2019.   CHF (congestive heart failure) (HCC)    Chronic anticoagulation 09/30/2017   Diabetes mellitus type 2 in nonobese [facility])    Does not have health insurance    Essential hypertension 09/26/2017   Financial difficulties    H/O noncompliance with medical treatment, presenting hazards to health    Non-insulin treated type 2 diabetes mellitus (HCC) 09/26/2017   Persistent atrial fibrillation (HCC)    Sleep apnea suspected 09/30/2017     FAMILY HISTORY   Family History  Problem Relation Age of Onset   Hypertension Mother     SOCIAL HISTORY:   Social History    Socioeconomic History   Marital status: Married    Spouse name: Not on file   Number of children: Not on file   Years of education: Not on file   Highest education level: Not on file  Occupational History   Not on file  Tobacco Use   Smoking status: Former    Years: 15.00    Types: Cigarettes    Quit date: 03/18/2018    Years since quitting: 3.7   Smokeless tobacco: Never   Tobacco comments:    09/26/2017 "2-3 cigarettes/month now"  Vaping Use   Vaping Use: Never used  Substance and Sexual Activity   Alcohol use: Not Currently    Alcohol/week: 3.0 standard drinks of alcohol    Types: 3 Cans of beer per week    Comment: States he quit drinking 8-9 months ago   Drug use: Never   Sexual activity: Not Currently  Other Topics Concern   Not on file  Social History Narrative   Not on file   Social Determinants of Health   Financial Resource Strain: Not on file  Food Insecurity: Not on file  Transportation Needs: Unmet Transportation Needs (09/09/2021)   PRAPARE - Transportation    Lack of Transportation (Medical): Yes    Lack of Transportation (Non-Medical): Yes  Physical Activity: Not on file  Stress: Stress Concern Present (09/26/2017)   [employer] of Occupational Health - Occupational Stress Questionnaire  Feeling of Stress : Very much  Social Connections: Not on file  Intimate Partner Violence: Not on file    ALLERGIES:    No Known Allergies  CURRENT MEDICATIONS:    Current Outpatient Medications  Medication Sig Dispense Refill   apixaban (ELIQUIS) 5 MG TABS tablet Take 1 tablet (5 mg total) by mouth 2 (two) times daily. 180 tablet 1   aspirin EC 81 MG tablet Take 1 tablet (81 mg total) by mouth daily. Swallow whole. 30 tablet 1   atorvastatin (LIPITOR) 80 MG tablet Take 1 tablet (80 mg total) by mouth daily. 30 tablet 3   busPIRone (BUSPAR) 10 MG tablet Take 1 tablet (10 mg total) by mouth 2 (two) times daily. 60 tablet 3   carvedilol (COREG) 3.125  MG tablet Take 1 tablet (3.125 mg total) by mouth 2 (two) times daily with a meal. 60 tablet 3   cetirizine (ZYRTEC) 10 MG tablet Take 1 tablet (10 mg total) by mouth at bedtime as needed for allergies. 90 tablet 3   clopidogrel (PLAVIX) 75 MG tablet Take 1 tablet (75 mg total) by mouth daily with breakfast. 30 tablet 0   digoxin (LANOXIN) 0.125 MG tablet Take 1 tablet (125 mcg total) by mouth once daily. 30 tablet 6   fluticasone (FLONASE) 50 MCG/ACT nasal spray Place 2 sprays into both nostrils daily. 16 g 6   furosemide (LASIX) 40 MG tablet Take 1 tablet (40 mg total) by mouth daily. 30 tablet 3   HYDROcodone-acetaminophen (NORCO/VICODIN) 5-325 MG tablet Take 1 tablet by mouth every 12 (twelve) hours as needed for moderate pain. 21 tablet 0   Insulin Glargine (BASAGLAR KWIKPEN) 100 UNIT/ML Inject 40 Units into the skin daily. 9 mL 3   insulin lispro (HUMALOG KWIKPEN) 100 UNIT/ML KwikPen Inject 15 Units into the skin 3 (three) times daily. 9 mL 3   sacubitril-valsartan (ENTRESTO) 49-51 MG Take 1 tablet by mouth 2 (two) times daily. 180 tablet 0   spironolactone (ALDACTONE) 25 MG tablet Take 1 tablet (25 mg total) by mouth daily. 30 tablet 4   No current facility-administered medications for this visit.    REVIEW OF SYSTEMS:   [X]  denotes positive finding, [ ]  denotes negative finding Cardiac  Comments:  Chest pain or chest pressure:    Shortness of breath upon exertion:    Short of breath when lying flat:    Irregular heart rhythm:        Vascular    Pain in calf, thigh, or hip brought on by ambulation:    Pain in feet at night that wakes you up from your sleep:     Blood clot in your veins:    Leg swelling:         Pulmonary    Oxygen at home:    Productive cough:     Wheezing:         Neurologic    Sudden weakness in arms or legs:     Sudden numbness in arms or legs:     Sudden onset of difficulty speaking or slurred speech:    Temporary loss of vision in one eye:      Problems with dizziness:         Gastrointestinal    Blood in stool:      Vomited blood:         Genitourinary    Burning when urinating:     Blood in urine:        Psychiatric  Major depression:         Hematologic    Bleeding problems:    Problems with blood clotting too easily:        Skin    Rashes or ulcers:        Constitutional    Fever or chills:     PHYSICAL EXAM:   Vitals:   12/20/21 1430  BP: 114/80  Pulse: 86  Resp: 20  Temp: 98 F (36.7 C)  SpO2: 97%  Weight: 177 lb 8 oz (80.5 kg)  Height: 6' (1.829 m)    GENERAL: The patient is a well-nourished male, in no acute distress. The vital signs are documented above. CARDIAC: There is a regular rate and rhythm.  VASCULAR: Right posterior tibial and peroneal Doppler signals are biphasic PULMONARY: Nonlabored respirations MUSCULOSKELETAL: There are no major deformities or cyanosis. NEUROLOGIC: No focal weakness or paresthesias are detected. SKIN: See photo below PSYCHIATRIC: The patient has a normal affect.     STUDIES:   I have reviewed the following: +-------+-----------+-----------+------------+------------+  ABI/TBIToday's ABIToday's TBIPrevious ABIPrevious TBI  +-------+-----------+-----------+------------+------------+  Right  0.92                                            +-------+-----------+-----------+------------+------------+  Left   0.81       0.47                                 +-------+-----------+-----------+------------+------------+  Left toe pressure:  62 Right toe pressure:  unable to calculate  ASSESSMENT and PLAN   Nonhealing right diabetic foot ulcer: I have recommended repeating his arteriogram via left femoral approach and intervening on his posterior tibial artery if it has developed stenosis, and also trying to recanalize his anterior tibial artery.  He is having difficulty getting this wound to heal.  This is likely secondary to his diabetes and  microcirculation, however I need to make sure that his blood flow is optimized and that he has not had recurrent stenoses.  This has been scheduled for Tuesday, September 26.  I will stop his Eliquis prior to the procedure.  I am also starting him on Bactrim for his cellulitis.  He does have a history of MRSA and was on 6 weeks of IV antibiotics.  Hopefully Bactrim will be sufficient.  We did discuss that he is at risk for further amputation.   Leia Alf, MD, FACS [facility] [phone] Pager [phone]

## 2021-12-20 NOTE — Progress Notes (Signed)
[facility] [place]  Patient name: David [name] MRN: [id number] DOB: October 31, 1960 Sex: male     REASON FOR Visit:    Follow up  HISTORY OF PRESENT ILLNESS:   David [name] is a 61 y.o. male, who initially presented to the hospital in May 2023 with a diabetic foot infection involving the right fourth toe, which had been present for about 3 weeks.  The patient did not realize he had a wound because he does not have feeling in his feet.  On 09/02/2021, he underwent angiography and was found to have single-vessel runoff via the peroneal artery.  I was able to recanalize his posterior tibial artery.  On 09/04/2021, he underwent fourth toe ray amputation by Dr. [name].  There was good petechial bleeding at the time of surgery.  Fortunately, his wound has never really healed.  He has developed blister on the bottom of his foot.  He is complaining of a fair amount of pain as well as some redness.  The patient suffers from type 2 diabetes.  He is on Eliquis for atrial fibrillation.  He suffers from congestive heart failure with an ejection fraction of 45%.  He is medically managed for hypertension and takes a statin for hypercholesterolemia.  He is a former smoker.  PAST MEDICAL HISTORY    Past Medical History:  Diagnosis Date   Cardiomyopathy (HCC)    a. EF 45% in 2019.   CHF (congestive heart failure) (HCC)    Chronic anticoagulation 09/30/2017   Diabetes mellitus type 2 in nonobese [facility])    Does not have health insurance    Essential hypertension 09/26/2017   Financial difficulties    H/O noncompliance with medical treatment, presenting hazards to health    Non-insulin treated type 2 diabetes mellitus (HCC) 09/26/2017   Persistent atrial fibrillation (HCC)    Sleep apnea suspected 09/30/2017     FAMILY HISTORY   Family History  Problem Relation Age of Onset   Hypertension Mother     SOCIAL HISTORY:   Social History    Socioeconomic History   Marital status: Married    Spouse name: Not on file   Number of children: Not on file   Years of education: Not on file   Highest education level: Not on file  Occupational History   Not on file  Tobacco Use   Smoking status: Former    Years: 15.00    Types: Cigarettes    Quit date: 03/18/2018    Years since quitting: 3.7   Smokeless tobacco: Never   Tobacco comments:    09/26/2017 "2-3 cigarettes/month now"  Vaping Use   Vaping Use: Never used  Substance and Sexual Activity   Alcohol use: Not Currently    Alcohol/week: 3.0 standard drinks of alcohol    Types: 3 Cans of beer per week    Comment: States he quit drinking 8-9 months ago   Drug use: Never   Sexual activity: Not Currently  Other Topics Concern   Not on file  Social History Narrative   Not on file   Social Determinants of Health   Financial Resource Strain: Not on file  Food Insecurity: Not on file  Transportation Needs: Unmet Transportation Needs (09/09/2021)   PRAPARE - Transportation    Lack of Transportation (Medical): Yes    Lack of Transportation (Non-Medical): Yes  Physical Activity: Not on file  Stress: Stress Concern Present (09/26/2017)   [employer] of Occupational Health - Occupational Stress Questionnaire  Feeling of Stress : Very much  Social Connections: Not on file  Intimate Partner Violence: Not on file    ALLERGIES:    No Known Allergies  CURRENT MEDICATIONS:    Current Outpatient Medications  Medication Sig Dispense Refill   apixaban (ELIQUIS) 5 MG TABS tablet Take 1 tablet (5 mg total) by mouth 2 (two) times daily. 180 tablet 1   aspirin EC 81 MG tablet Take 1 tablet (81 mg total) by mouth daily. Swallow whole. 30 tablet 1   atorvastatin (LIPITOR) 80 MG tablet Take 1 tablet (80 mg total) by mouth daily. 30 tablet 3   busPIRone (BUSPAR) 10 MG tablet Take 1 tablet (10 mg total) by mouth 2 (two) times daily. 60 tablet 3   carvedilol (COREG) 3.125  MG tablet Take 1 tablet (3.125 mg total) by mouth 2 (two) times daily with a meal. 60 tablet 3   cetirizine (ZYRTEC) 10 MG tablet Take 1 tablet (10 mg total) by mouth at bedtime as needed for allergies. 90 tablet 3   clopidogrel (PLAVIX) 75 MG tablet Take 1 tablet (75 mg total) by mouth daily with breakfast. 30 tablet 0   digoxin (LANOXIN) 0.125 MG tablet Take 1 tablet (125 mcg total) by mouth once daily. 30 tablet 6   fluticasone (FLONASE) 50 MCG/ACT nasal spray Place 2 sprays into both nostrils daily. 16 g 6   furosemide (LASIX) 40 MG tablet Take 1 tablet (40 mg total) by mouth daily. 30 tablet 3   HYDROcodone-acetaminophen (NORCO/VICODIN) 5-325 MG tablet Take 1 tablet by mouth every 12 (twelve) hours as needed for moderate pain. 21 tablet 0   Insulin Glargine (BASAGLAR KWIKPEN) 100 UNIT/ML Inject 40 Units into the skin daily. 9 mL 3   insulin lispro (HUMALOG KWIKPEN) 100 UNIT/ML KwikPen Inject 15 Units into the skin 3 (three) times daily. 9 mL 3   sacubitril-valsartan (ENTRESTO) 49-51 MG Take 1 tablet by mouth 2 (two) times daily. 180 tablet 0   spironolactone (ALDACTONE) 25 MG tablet Take 1 tablet (25 mg total) by mouth daily. 30 tablet 4   No current facility-administered medications for this visit.    REVIEW OF SYSTEMS:   [X]  denotes positive finding, [ ]  denotes negative finding Cardiac  Comments:  Chest pain or chest pressure:    Shortness of breath upon exertion:    Short of breath when lying flat:    Irregular heart rhythm:        Vascular    Pain in calf, thigh, or hip brought on by ambulation:    Pain in feet at night that wakes you up from your sleep:     Blood clot in your veins:    Leg swelling:         Pulmonary    Oxygen at home:    Productive cough:     Wheezing:         Neurologic    Sudden weakness in arms or legs:     Sudden numbness in arms or legs:     Sudden onset of difficulty speaking or slurred speech:    Temporary loss of vision in one eye:      Problems with dizziness:         Gastrointestinal    Blood in stool:      Vomited blood:         Genitourinary    Burning when urinating:     Blood in urine:        Psychiatric  Major depression:         Hematologic    Bleeding problems:    Problems with blood clotting too easily:        Skin    Rashes or ulcers:        Constitutional    Fever or chills:     PHYSICAL EXAM:   Vitals:   12/20/21 1430  BP: 114/80  Pulse: 86  Resp: 20  Temp: 98 F (36.7 C)  SpO2: 97%  Weight: 177 lb 8 oz (80.5 kg)  Height: 6' (1.829 m)    GENERAL: The patient is a well-nourished male, in no acute distress. The vital signs are documented above. CARDIAC: There is a regular rate and rhythm.  VASCULAR: Right posterior tibial and peroneal Doppler signals are biphasic PULMONARY: Nonlabored respirations MUSCULOSKELETAL: There are no major deformities or cyanosis. NEUROLOGIC: No focal weakness or paresthesias are detected. SKIN: See photo below PSYCHIATRIC: The patient has a normal affect.     STUDIES:   I have reviewed the following: +-------+-----------+-----------+------------+------------+  ABI/TBIToday's ABIToday's TBIPrevious ABIPrevious TBI  +-------+-----------+-----------+------------+------------+  Right  0.92                                            +-------+-----------+-----------+------------+------------+  Left   0.81       0.47                                 +-------+-----------+-----------+------------+------------+  Left toe pressure:  62 Right toe pressure:  unable to calculate  ASSESSMENT and PLAN   Nonhealing right diabetic foot ulcer: I have recommended repeating his arteriogram via left femoral approach and intervening on his posterior tibial artery if it has developed stenosis, and also trying to recanalize his anterior tibial artery.  He is having difficulty getting this wound to heal.  This is likely secondary to his diabetes and  microcirculation, however I need to make sure that his blood flow is optimized and that he has not had recurrent stenoses.  This has been scheduled for Tuesday, September 26.  I will stop his Eliquis prior to the procedure.  I am also starting him on Bactrim for his cellulitis.  He does have a history of MRSA and was on 6 weeks of IV antibiotics.  Hopefully Bactrim will be sufficient.  We did discuss that he is at risk for further amputation.   Leia Alf, MD, FACS [facility] [phone] Pager [phone]

## 2021-12-20 NOTE — Progress Notes (Signed)
ABI has been completed.   Preliminary results in CV Proc.   David [name] 12/20/2021 9:37 AM

## 2021-12-21 MED ORDER — HYDROCODONE-ACETAMINOPHEN 5-325 MG PO TABS: 1.0000 | ORAL_TABLET | Freq: Two times a day (BID) | ORAL | 0 refills | Status: DC | PRN

## 2021-12-22 ENCOUNTER — Other Ambulatory Visit: Payer: Self-pay

## 2021-12-22 DIAGNOSIS — I7025 Atherosclerosis of native arteries of other extremities with ulceration: Secondary | ICD-10-CM

## 2021-12-24 ENCOUNTER — Other Ambulatory Visit: Payer: Self-pay | Admitting: *Deleted

## 2021-12-24 ENCOUNTER — Other Ambulatory Visit (HOSPITAL_COMMUNITY): Payer: Self-pay

## 2021-12-24 DIAGNOSIS — E7800 Pure hypercholesterolemia, unspecified: Secondary | ICD-10-CM

## 2021-12-24 MED ORDER — EZETIMIBE 10 MG PO TABS
10.0000 mg | ORAL_TABLET | Freq: Every day | ORAL | 3 refills | Status: DC
  Filled 2021-12-24: qty 90, 90d supply, fill #0

## 2021-12-27 ENCOUNTER — Ambulatory Visit: Payer: Self-pay | Admitting: Internal Medicine

## 2021-12-28 ENCOUNTER — Ambulatory Visit (HOSPITAL_COMMUNITY)
Admission: RE | Admit: 2021-12-28 | Discharge: 2021-12-28 | Disposition: A | Discharge status: Home / Self Care | Payer: Self-pay | Attending: Surgery | Admitting: Surgery

## 2021-12-28 ENCOUNTER — Other Ambulatory Visit: Payer: Self-pay

## 2021-12-28 ENCOUNTER — Encounter (HOSPITAL_COMMUNITY)
Admission: RE | Disposition: A | Discharge status: Home / Self Care | Payer: Self-pay | Source: Home / Self Care | Attending: Surgery

## 2021-12-28 DIAGNOSIS — I70235 Atherosclerosis of native arteries of right leg with ulceration of other part of foot: Secondary | ICD-10-CM | POA: Insufficient documentation

## 2021-12-28 DIAGNOSIS — I509 Heart failure, unspecified: Secondary | ICD-10-CM | POA: Insufficient documentation

## 2021-12-28 DIAGNOSIS — E1151 Type 2 diabetes mellitus with diabetic peripheral angiopathy without gangrene: Secondary | ICD-10-CM | POA: Insufficient documentation

## 2021-12-28 DIAGNOSIS — I70245 Atherosclerosis of native arteries of left leg with ulceration of other part of foot: Secondary | ICD-10-CM

## 2021-12-28 DIAGNOSIS — I4891 Unspecified atrial fibrillation: Secondary | ICD-10-CM | POA: Insufficient documentation

## 2021-12-28 DIAGNOSIS — Z794 Long term (current) use of insulin: Secondary | ICD-10-CM | POA: Insufficient documentation

## 2021-12-28 DIAGNOSIS — E11621 Type 2 diabetes mellitus with foot ulcer: Secondary | ICD-10-CM | POA: Insufficient documentation

## 2021-12-28 DIAGNOSIS — I110 Hypertensive heart disease with heart failure: Secondary | ICD-10-CM | POA: Insufficient documentation

## 2021-12-28 DIAGNOSIS — L97519 Non-pressure chronic ulcer of other part of right foot with unspecified severity: Secondary | ICD-10-CM | POA: Insufficient documentation

## 2021-12-28 DIAGNOSIS — Z7901 Long term (current) use of anticoagulants: Secondary | ICD-10-CM | POA: Insufficient documentation

## 2021-12-28 DIAGNOSIS — Z87891 Personal history of nicotine dependence: Secondary | ICD-10-CM | POA: Insufficient documentation

## 2021-12-28 DIAGNOSIS — I7025 Atherosclerosis of native arteries of other extremities with ulceration: Secondary | ICD-10-CM

## 2021-12-28 DIAGNOSIS — E7800 Pure hypercholesterolemia, unspecified: Secondary | ICD-10-CM | POA: Insufficient documentation

## 2021-12-28 HISTORY — PX: PERIPHERAL VASCULAR BALLOON ANGIOPLASTY: CATH118281

## 2021-12-28 HISTORY — PX: ABDOMINAL AORTOGRAM W/LOWER EXTREMITY: CATH118223

## 2021-12-28 LAB — GLUCOSE, CAPILLARY: Glucose-Capillary: 219 mg/dL — ABNORMAL HIGH (ref 70–99)

## 2021-12-28 LAB — POCT I-STAT, CHEM 8
BUN: 32 mg/dL — ABNORMAL HIGH (ref 6–20)
Calcium, Ion: 1.39 mmol/L (ref 1.15–1.40)
Chloride: 99 mmol/L (ref 98–111)
Creatinine, Ser: 1.4 mg/dL — ABNORMAL HIGH (ref 0.61–1.24)
Glucose, Bld: 277 mg/dL — ABNORMAL HIGH (ref 70–99)
HCT: 46 % (ref 39.0–52.0)
Hemoglobin: 15.6 g/dL (ref 13.0–17.0)
Potassium: 5 mmol/L (ref 3.5–5.1)
Sodium: 135 mmol/L (ref 135–145)
TCO2: 27 mmol/L (ref 22–32)

## 2021-12-28 LAB — POCT ACTIVATED CLOTTING TIME: Activated Clotting Time: 275 seconds

## 2021-12-28 SURGERY — ABDOMINAL AORTOGRAM W/LOWER EXTREMITY
Anesthesia: LOCAL

## 2021-12-28 MED ORDER — MIDAZOLAM HCL 2 MG/2ML IJ SOLN
INTRAMUSCULAR | Status: AC
  Filled 2021-12-28: qty 2

## 2021-12-28 MED ORDER — HEPARIN SODIUM (PORCINE) 1000 UNIT/ML IJ SOLN
INTRAMUSCULAR | Status: AC
  Filled 2021-12-28: qty 10

## 2021-12-28 MED ORDER — SODIUM CHLORIDE 0.9 % WEIGHT BASED INFUSION: 1.0000 mL/kg/h | INTRAVENOUS | Status: DC

## 2021-12-28 MED ORDER — ONDANSETRON HCL 4 MG/2ML IJ SOLN: 4.0000 mg | Freq: Four times a day (QID) | INTRAMUSCULAR | Status: DC | PRN

## 2021-12-28 MED ORDER — HEPARIN (PORCINE) IN NACL 1000-0.9 UT/500ML-% IV SOLN
INTRAVENOUS | Status: AC
  Filled 2021-12-28: qty 500

## 2021-12-28 MED ORDER — MORPHINE SULFATE (PF) 2 MG/ML IV SOLN
2.0000 mg | INTRAVENOUS | Status: DC | PRN
  Administered 2021-12-28: 2 mg via INTRAVENOUS
  Filled 2021-12-28: qty 1

## 2021-12-28 MED ORDER — IODIXANOL 320 MG/ML IV SOLN
INTRAVENOUS | Status: DC | PRN
  Administered 2021-12-28: 62 mL via INTRA_ARTERIAL

## 2021-12-28 MED ORDER — SODIUM CHLORIDE 0.9% FLUSH: 3.0000 mL | INTRAVENOUS | Status: DC | PRN

## 2021-12-28 MED ORDER — OXYCODONE HCL 5 MG PO TABS
5.0000 mg | ORAL_TABLET | ORAL | Status: DC | PRN
  Administered 2021-12-28: 10 mg via ORAL
  Filled 2021-12-28: qty 2

## 2021-12-28 MED ORDER — MIDAZOLAM HCL 2 MG/2ML IJ SOLN
INTRAMUSCULAR | Status: DC | PRN
  Administered 2021-12-28 (×4): 1 mg via INTRAVENOUS

## 2021-12-28 MED ORDER — SODIUM CHLORIDE 0.9% FLUSH: 3.0000 mL | Freq: Two times a day (BID) | INTRAVENOUS | Status: DC

## 2021-12-28 MED ORDER — FENTANYL CITRATE (PF) 100 MCG/2ML IJ SOLN
INTRAMUSCULAR | Status: AC
  Filled 2021-12-28: qty 2

## 2021-12-28 MED ORDER — CLOPIDOGREL BISULFATE 75 MG PO TABS: 75.0000 mg | ORAL_TABLET | Freq: Every day | ORAL | Status: DC

## 2021-12-28 MED ORDER — LIDOCAINE HCL (PF) 1 % IJ SOLN
INTRAMUSCULAR | Status: AC
  Filled 2021-12-28: qty 30

## 2021-12-28 MED ORDER — HEPARIN SODIUM (PORCINE) 1000 UNIT/ML IJ SOLN
INTRAMUSCULAR | Status: DC | PRN
  Administered 2021-12-28: 9000 [IU] via INTRAVENOUS

## 2021-12-28 MED ORDER — FENTANYL CITRATE (PF) 100 MCG/2ML IJ SOLN
INTRAMUSCULAR | Status: DC | PRN
  Administered 2021-12-28: 25 ug via INTRAVENOUS
  Administered 2021-12-28: 50 ug via INTRAVENOUS
  Administered 2021-12-28 (×2): 25 ug via INTRAVENOUS

## 2021-12-28 MED ORDER — ASPIRIN 81 MG PO TBEC: 81.0000 mg | DELAYED_RELEASE_TABLET | Freq: Every day | ORAL | Status: DC

## 2021-12-28 MED ORDER — ACETAMINOPHEN 325 MG PO TABS: 650.0000 mg | ORAL_TABLET | ORAL | Status: DC | PRN

## 2021-12-28 MED ORDER — LABETALOL HCL 5 MG/ML IV SOLN: 10.0000 mg | INTRAVENOUS | Status: DC | PRN

## 2021-12-28 MED ORDER — SODIUM CHLORIDE 0.9 % IV SOLN: INTRAVENOUS | Status: DC

## 2021-12-28 MED ORDER — HEPARIN (PORCINE) IN NACL 1000-0.9 UT/500ML-% IV SOLN
INTRAVENOUS | Status: DC | PRN
  Administered 2021-12-28 (×2): 500 mL

## 2021-12-28 MED ORDER — HYDRALAZINE HCL 20 MG/ML IJ SOLN: 5.0000 mg | INTRAMUSCULAR | Status: DC | PRN

## 2021-12-28 MED ORDER — SODIUM CHLORIDE 0.9 % IV SOLN: 250.0000 mL | INTRAVENOUS | Status: DC | PRN

## 2021-12-28 MED ORDER — LIDOCAINE HCL (PF) 1 % IJ SOLN
INTRAMUSCULAR | Status: DC | PRN
  Administered 2021-12-28: 15 mL via INTRADERMAL
  Administered 2021-12-28: 2 mL via INTRADERMAL

## 2021-12-28 SURGICAL SUPPLY — 25 items
BAG SNAP BAND KOVER 36X36 (MISCELLANEOUS) IMPLANT
BALLN STERLING OTW 3X150X150 (BALLOONS) ×2
BALLOON STERLING OTW 3X150X150 (BALLOONS) IMPLANT
BNDG HMST LF TOP THROMBIX (HEMOSTASIS) ×2
CATH OMNI FLUSH 5F 65CM (CATHETERS) IMPLANT
CATH QUICKCROSS ANG SELECT (CATHETERS) IMPLANT
COVER DOME SNAP 22 D (MISCELLANEOUS) IMPLANT
DEVICE VASC CLSR CELT ART 5 (Vascular Products) IMPLANT
KIT MICROPUNCTURE NIT STIFF (SHEATH) IMPLANT
KIT PV (KITS) ×3 IMPLANT
PATCH THROMBIX TOPICAL PLAIN (HEMOSTASIS) IMPLANT
PROTECTION STATION PRESSURIZED (MISCELLANEOUS) ×2
SET ATX SIMPLICITY (MISCELLANEOUS) IMPLANT
SHEATH DESTINATION MP 5FR 45CM (SHEATH) IMPLANT
SHEATH GLIDE SLENDER 4/5FR (SHEATH) IMPLANT
SHEATH MICROPUNCTURE PEDAL 4FR (SHEATH) IMPLANT
SHEATH MICROPUNCTURE PEDAL 5FR (SHEATH) IMPLANT
SHEATH PINNACLE 5F 10CM (SHEATH) IMPLANT
SHEATH PROBE COVER 6X72 (BAG) IMPLANT
STATION PROTECTION PRESSURIZED (MISCELLANEOUS) IMPLANT
SYR MEDRAD MARK V 150ML (SYRINGE) IMPLANT
TRANSDUCER W/STOPCOCK (MISCELLANEOUS) ×3 IMPLANT
TRAY PV CATH (CUSTOM PROCEDURE TRAY) ×3 IMPLANT
WIRE BENTSON .035X145CM (WIRE) IMPLANT
WIRE G V18X300CM (WIRE) IMPLANT

## 2021-12-28 NOTE — Op Note (Signed)
Patient name: David [name] MRN: [id number] DOB: 1960/05/11 Sex: male  12/28/2021 Pre-operative Diagnosis: right foot ulcer Post-operative diagnosis:  Same Surgeon:  Annamarie Major Procedure Performed:  1.  U/s guided access left femoral artery  2.  U/s guided access, right posterior tibial artery  3.  aortogram  4.  Right leg runoff  5.  Angioplasty right posterior tibial artery  6.  Conscious sedation, 79 minutes  7.  Closure device, Celt   Indications:  61 year old with a nonhealing right fourth toe potation.  He has previously undergone recanalization of occluded posterior tibial artery.  Because he is not healing, he comes back for angiography.  Procedure:  The patient was identified in the holding area and taken to room 8.  The patient was then placed supine on the table and prepped and draped in the usual sterile fashion.  A time out was called.  Conscious sedation was administered with the use of IV fentanyl and Versed under continuous physician and nurse monitoring.  Heart rate, blood pressure, and oxygen saturation were continuously monitored.  Total sedation time was 79 minutes.  Ultrasound was used to evaluate the left common femoral artery.  It was patent .  A digital ultrasound image was acquired.  A micropuncture needle was used to access the left common femoral artery under ultrasound guidance.  An 018 wire was advanced without resistance and a micropuncture sheath was placed.  The 018 wire was removed and a benson wire was placed.  The micropuncture sheath was exchanged for a 5 french sheath.  An omniflush catheter was advanced over the wire to the level of L-1.  An abdominal angiogram was obtained.  Next, using the omniflush catheter and a benson wire, the aortic bifurcation was crossed and the catheter was placed into theright external iliac artery and right runoff was obtained.   Findings:   Aortogram: No significant renal artery stenosis was identified.  The infrarenal  abdominal aorta is widely patent.  No significant iliac artery stenosis was identified.  Right Lower Extremity: The right common femoral profundofemoral and superficial femoral artery are patent throughout their course with only mild calcification at the adductor canal.  The popliteal arteries widely patent with single-vessel runoff via the peroneal artery.  There is reconstitution of the posterior tibial at the ankle.  Left Lower Extremity: Not evaluated  Intervention: After the above images were acquired the decision was made to proceed with intervention.  A 5 French 45 cm sheath was advanced into the right superficial femoral artery.  The patient was fully heparinized next using a V-18 wire and a 3 x 150 Sterling balloon followed by a quick cross select catheter, I tried to cannulate the flush occlusion of the posterior tibial artery.  This was unsuccessful and so I felt pedal access was necessary  The right foot was then prepped and draped sterilely.  The right posterior tibial artery was evaluated with ultrasound and found to be calcified but patent.  1% lidocaine was used for local anesthesia.  The right posterior tibial artery was then cannulated under ultrasound guidance with a micropuncture needle.  A [place] wire was advanced without resistance followed by placement of a 5 French slender sheath.  A V-18 wire easily went into the popliteal artery.  I selected a 3 x 150 Sterling balloon and performed balloon angioplasty of the posterior tibial artery throughout its length.  Completion imaging showed two-vessel runoff now through the peroneal and posterior tibial artery.  A  Celt was used to close the sheath in the left groin and manual pressure was used for hemostasis in the pedal access.  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  The patient now has two-vessel runoff via the peroneal and  posterior tibial artery    V. Durene Cal, M.D., [facility] [place] Office: [phone] Pager:  [phone]

## 2021-12-29 ENCOUNTER — Telehealth: Payer: Self-pay | Admitting: Surgery

## 2021-12-29 ENCOUNTER — Encounter (HOSPITAL_COMMUNITY): Payer: Self-pay | Admitting: Surgery

## 2021-12-29 NOTE — Telephone Encounter (Signed)
-----   Message from Serafina Mitchell, MD sent at 12/28/2021  2:37 PM EDT ----- 12/28/2021:   Surgeon:  Annamarie Major Procedure Performed:  1.  U/s guided access left femoral artery  2.  U/s guided access, right posterior tibial artery  3.  aortogram  4.  Right leg runoff  5.  Angioplasty right posterior tibial artery  6.  Conscious sedation, 79 minutes  7.  Closure device, Celt  Follow-up in the office with me in 1 month with ABIs and duplex of the right leg

## 2021-12-30 ENCOUNTER — Other Ambulatory Visit: Payer: Self-pay

## 2021-12-30 ENCOUNTER — Telehealth: Payer: Self-pay

## 2021-12-30 DIAGNOSIS — I739 Peripheral vascular disease, unspecified: Secondary | ICD-10-CM

## 2021-12-30 DIAGNOSIS — I7025 Atherosclerosis of native arteries of other extremities with ulceration: Secondary | ICD-10-CM

## 2021-12-30 NOTE — Interval H&P Note (Signed)
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  The various methods of treatment have been discussed with the patient and family. After consideration of risks, benefits and other options for treatment, the patient has consented to  Procedure(s) with comments: ABDOMINAL AORTOGRAM W/LOWER EXTREMITY (N/A) PERIPHERAL VASCULAR BALLOON ANGIOPLASTY - Posterior Tibial PTA only as a surgical intervention.  The patient's history has been reviewed, patient examined, no change in status, stable for surgery.  I have reviewed the patient's chart and labs.  Questions were answered to the patient's satisfaction.     David [name]

## 2021-12-30 NOTE — Telephone Encounter (Signed)
Pt called with c/o R calf pain since procedure this week. He felt it in recovery room and it has persisted. MD is aware. Pt is coming in tomorrow for ABI and to see APP.

## 2021-12-31 ENCOUNTER — Ambulatory Visit (HOSPITAL_COMMUNITY): Payer: Self-pay

## 2021-12-31 ENCOUNTER — Ambulatory Visit: Payer: Self-pay

## 2022-01-02 ENCOUNTER — Other Ambulatory Visit: Payer: Self-pay | Admitting: Family Medicine

## 2022-01-02 ENCOUNTER — Other Ambulatory Visit: Payer: Self-pay | Admitting: Surgery

## 2022-01-03 ENCOUNTER — Other Ambulatory Visit: Payer: Self-pay

## 2022-01-03 ENCOUNTER — Telehealth: Payer: Self-pay

## 2022-01-03 MED ORDER — CLOPIDOGREL BISULFATE 75 MG PO TABS
75.0000 mg | ORAL_TABLET | Freq: Every day | ORAL | 2 refills | Status: DC
  Filled 2022-01-03: qty 30, 30d supply, fill #0
  Filled 2022-02-08 – 2022-02-15 (×2): qty 30, 30d supply, fill #1
  Filled 2022-03-18 (×2): qty 30, 30d supply, fill #2

## 2022-01-03 MED ORDER — SULFAMETHOXAZOLE-TRIMETHOPRIM 800-160 MG PO TABS
1.0000 | ORAL_TABLET | Freq: Two times a day (BID) | ORAL | 0 refills | Status: DC
  Filled 2022-01-03: qty 28, 14d supply, fill #0

## 2022-01-03 NOTE — Telephone Encounter (Signed)
Pt sent Mychart msg late last night regarding the need to be seen earlier. Replied to msg asking if things had worsened since he canceled his Friday appts d/t "feeling better".  Spoke with [place], PA who advised having pt come in for ABI and visit sometime this week. Called pt and gave him a few options, pt stated he would have to call his boss and call back.  Pt called back and appts were scheduled to facilitate both [place] studies Dr Trula Slade requested and a PA visit. Confirmed understanding.

## 2022-01-03 NOTE — Telephone Encounter (Signed)
Requested medications are due for refill today.  unsure  Requested medications are on the active medications list.  yes  Last refill. 12/02/2021 #30 0 rf  Future visit scheduled.   yes  Notes to clinic.  PT has 2 antiplatlets on med list as well as ASA. Please review.    Requested Prescriptions  Pending Prescriptions Disp Refills   clopidogrel (PLAVIX) 75 MG tablet 30 tablet 0    Sig: Take 1 tablet (75 mg total) by mouth daily with breakfast.     Hematology: Antiplatelets - clopidogrel Failed - 01/02/2022  4:11 AM      Failed - Cr in normal range and within 360 days    Creat  Date Value Ref Range Status  11/12/2021 1.73 (H) 0.70 - 1.35 mg/dL Final   Creatinine, Ser  Date Value Ref Range Status  12/28/2021 1.40 (H) 0.61 - 1.24 mg/dL Final   Creatinine, Urine  Date Value Ref Range Status  08/30/2021 38.98 mg/dL Final    Comment:    Performed at [facility], [place] [address] [place], [place] [address]         Passed - HCT in normal range and within 180 days    HCT  Date Value Ref Range Status  12/28/2021 46.0 39.0 - 52.0 % Final   Hematocrit  Date Value Ref Range Status  06/16/2021 47.0 37.5 - 51.0 % Final         Passed - HGB in normal range and within 180 days    Hemoglobin  Date Value Ref Range Status  12/28/2021 15.6 13.0 - 17.0 g/dL Final  06/16/2021 17.3 13.0 - 17.7 g/dL Final         Passed - PLT in normal range and within 180 days    Platelets  Date Value Ref Range Status  11/12/2021 261 140 - 400 Thousand/uL Final  06/16/2021 244 150 - 450 x10E3/uL Final         Passed - Valid encounter within last 6 months    Recent Outpatient Visits           2 weeks ago Type 2 diabetes mellitus with hyperglycemia, with long-term current use of insulin [facility])   [place], [place], [place]   12 months ago Type 2 diabetes mellitus with hyperglycemia, with long-term current use of insulin [facility])   [place], Vernia Buff, NP   1 year ago Essential hypertension   [place], [place] W, NP   2 years ago Diabetes mellitus type 2, uncontrolled, with complications [facility])   [place], [place], [place]   3 years ago Diabetes mellitus type 2, uncontrolled, with complications [facility])   [place], Vernia Buff, NP       Future Appointments             In 2 weeks Newt Minion, MD [place]   In 2 months Charlott Rakes, MD [place]

## 2022-01-04 ENCOUNTER — Other Ambulatory Visit (HOSPITAL_COMMUNITY): Payer: Self-pay

## 2022-01-04 MED ORDER — ENTRESTO 49-51 MG PO TABS
1.0000 | ORAL_TABLET | Freq: Two times a day (BID) | ORAL | 0 refills | Status: DC
  Filled 2022-01-04 – 2022-06-09 (×5): qty 60, 30d supply, fill #0

## 2022-01-04 NOTE — Progress Notes (Unsigned)
HISTORY AND PHYSICAL     CC:  follow up. Requesting Provider:  Charlott Rakes, MD  HPI: This is a 61 y.o. male who is here today for follow up for PAD.  Pt has hx of diabetic foot ulcer and hospitalized in May 2023 that involved the right 4th toe.  He did not realize he had the wound due to no feeling in his feet.  On 09/02/2021, he underwent angiogram and found to have single vessel runoff via the peroneal artery.  The PTA was recanalized.  On 09/04/2021 he underwent right 4th toe ray amputation by Dr. Sharol Given.   He was seen by Dr. Trula Slade on 12/20/2021 and at that time, he had developed a blister on the bottom of his foot and was having complaints about pain as well as some redness.  He was taken for angiogram on 12/28/2021 and underwent angioplasty of the right PTA.  The right common femoral profundofemoral and superficial femoral artery are patent throughout their course with only mild calcification at the adductor canal.  The popliteal arteries widely patent with single-vessel runoff via the peroneal artery.  There is reconstitution of the posterior tibial at the ankle.  The pt returns today with complaints of continued pain in the right foot.  He states that the pain is making him crazy.  He states that his toe amp site is not healing and the sore on the side/bottom of his foot is getting worse.  He is not happy with his post operative course from the toe amputation and if further surgery is needed, he would prefer Dr. Trula Slade to do it.    He states he has lost 40lbs, not drank in 9 months and he is no longer smoking.  He works as a Biomedical scientist at [employer]   He is currently on Bactrim and has a refill.    The pt is on a statin for cholesterol management.    The pt is on an aspirin.    Other AC:  Eliquis/Plavix The pt is on BB, ARB, diuretic for hypertension.  The pt does  have diabetes. Tobacco hx:  former   Past Medical History:  Diagnosis Date   Cardiomyopathy ([place])    a. EF 45% in  2019.   CHF (congestive heart failure) (HCC)    Chronic anticoagulation 09/30/2017   Diabetes mellitus type 2 in nonobese [facility])    Does not have health insurance    Essential hypertension 09/26/2017   Financial difficulties    H/O noncompliance with medical treatment, presenting hazards to health    Non-insulin treated type 2 diabetes mellitus ([place]) 09/26/2017   Persistent atrial fibrillation ([place])    Sleep apnea suspected 09/30/2017    Past Surgical History:  Procedure Laterality Date   ABDOMINAL AORTOGRAM W/LOWER EXTREMITY N/A 09/02/2021   Procedure: ABDOMINAL AORTOGRAM W/LOWER EXTREMITY;  Surgeon: Serafina Mitchell, MD;  Location: [place] [facility];  Service: Cardiovascular;  Laterality: N/A;   ABDOMINAL AORTOGRAM W/LOWER EXTREMITY N/A 12/28/2021   Procedure: ABDOMINAL AORTOGRAM W/LOWER EXTREMITY;  Surgeon: Serafina Mitchell, MD;  Location: [place] [facility];  Service: Cardiovascular;  Laterality: N/A;   AMPUTATION Right 09/04/2021   Procedure: 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;  Surgeon: Newt Minion, MD;  Location: [place];  Service: Orthopedics;  Laterality: Right;   APPENDECTOMY  1971   CARDIOVERSION N/A 09/28/2017   Procedure: CARDIOVERSION;  Surgeon: Sanda Klein, MD;  Location: [facility];  Service: Cardiovascular;  Laterality: N/A;   PERIPHERAL VASCULAR  BALLOON ANGIOPLASTY  09/02/2021   Procedure: PERIPHERAL VASCULAR BALLOON ANGIOPLASTY;  Surgeon: Nada Libman, MD;  [facility]: [facility];  Service: Cardiovascular;;   PERIPHERAL VASCULAR BALLOON ANGIOPLASTY  12/28/2021   Procedure: PERIPHERAL VASCULAR BALLOON ANGIOPLASTY;  Surgeon: Nada Libman, MD;  [facility];  Service: Cardiovascular;;  Posterior Tibial PTA only   TEE WITHOUT CARDIOVERSION N/A 09/28/2017   Procedure: TRANSESOPHAGEAL ECHOCARDIOGRAM (TEE);  Surgeon: Thurmon Fair, MD;  Location: [facility] ENDOSCOPY;  Service: Cardiovascular;  Laterality: N/A;   TEE WITHOUT CARDIOVERSION N/A 09/03/2021    Procedure: TRANSESOPHAGEAL ECHOCARDIOGRAM (TEE);  Surgeon: Chilton Si, MD;  Location[facility];  Service: Cardiovascular;  Laterality: N/A;    No Known Allergies  Current Outpatient Medications  Medication Sig Dispense Refill   apixaban (ELIQUIS) 5 MG TABS tablet Take 1 tablet (5 mg total) by mouth 2 (two) times daily. 180 tablet 1   aspirin EC 81 MG tablet Take 1 tablet (81 mg total) by mouth daily. Swallow whole. 30 tablet 1   atorvastatin (LIPITOR) 80 MG tablet Take 1 tablet (80 mg total) by mouth daily. 30 tablet 3   busPIRone (BUSPAR) 10 MG tablet Take 1 tablet (10 mg total) by mouth 2 (two) times daily. 60 tablet 3   carvedilol (COREG) 3.125 MG tablet Take 1 tablet (3.125 mg total) by mouth 2 (two) times daily with a meal. 60 tablet 3   cetirizine (ZYRTEC) 10 MG tablet Take 1 tablet (10 mg total) by mouth at bedtime as needed for allergies. 90 tablet 3   clopidogrel (PLAVIX) 75 MG tablet Take 1 tablet (75 mg total) by mouth daily with breakfast. 30 tablet 2   digoxin (LANOXIN) 0.125 MG tablet Take 1 tablet (125 mcg total) by mouth once daily. 30 tablet 6   ezetimibe (ZETIA) 10 MG tablet Take 1 tablet (10 mg total) by mouth daily. 90 tablet 3   fluticasone (FLONASE) 50 MCG/ACT nasal spray Place 2 sprays into both nostrils daily. 16 g 6   furosemide (LASIX) 40 MG tablet Take 1 tablet (40 mg total) by mouth daily. 30 tablet 3   HYDROcodone-acetaminophen (NORCO/VICODIN) 5-325 MG tablet Take 1 tablet by mouth every 12 (twelve) hours as needed for moderate pain. 14 tablet 0   Insulin Glargine (BASAGLAR KWIKPEN) 100 UNIT/ML Inject 40 Units into the skin daily. 9 mL 3   insulin lispro (HUMALOG KWIKPEN) 100 UNIT/ML KwikPen Inject 15 Units into the skin 3 (three) times daily. 9 mL 3   sacubitril-valsartan (ENTRESTO) 49-51 MG Take 1 tablet by mouth 2 (two) times daily. 180 tablet 0   spironolactone (ALDACTONE) 25 MG tablet Take 1 tablet (25 mg total) by mouth daily. 30 tablet 4    sulfamethoxazole-trimethoprim (BACTRIM DS) 800-160 MG tablet Take 1 tablet by mouth 2 (two) times daily. 28 tablet 0   No current facility-administered medications for this visit.    Family History  Problem Relation Age of Onset   Hypertension Mother     Social History   Socioeconomic History   Marital status: Married    Spouse name: Not on file   Number of children: Not on file   Years of education: Not on file   Highest education level: Not on file  Occupational History   Not on file  Tobacco Use   Smoking status: Former    Years: 15.00    Types: Cigarettes    Quit date: 03/18/2018    Years since quitting: 3.8   Smokeless tobacco:  Never   Tobacco comments:    09/26/2017 "2-3 cigarettes/month now"  Vaping Use   Vaping Use: Never used  Substance and Sexual Activity   Alcohol use: Not Currently    Alcohol/week: 3.0 standard drinks of alcohol    Types: 3 Cans of beer per week    Comment: States he quit drinking 8-9 months ago   Drug use: Never   Sexual activity: Not Currently  Other Topics Concern   Not on file  Social History Narrative   Not on file   Social Determinants of Health   Financial Resource Strain: Not on file  Food Insecurity: Not on file  Transportation Needs: Unmet Transportation Needs (09/09/2021)   PRAPARE - Transportation    Lack of Transportation (Medical): Yes    Lack of Transportation (Non-Medical): Yes  Physical Activity: Not on file  Stress: Stress Concern Present (09/26/2017)   [employer] of Occupational Health - Occupational Stress Questionnaire    Feeling of Stress : Very much  Social Connections: Not on file  Intimate Partner Violence: Not on file     REVIEW OF SYSTEMS:   [X]  denotes positive finding, [ ]  denotes negative finding Cardiac  Comments:  Chest pain or chest pressure:    Shortness of breath upon exertion:    Short of breath when lying flat:    Irregular heart rhythm:        Vascular    Pain in calf, thigh, or  hip brought on by ambulation:    Pain in feet at night that wakes you up from your sleep:  x   Blood clot in your veins:    Leg swelling:         Pulmonary    Oxygen at home:    Productive cough:     Wheezing:         Neurologic    Sudden weakness in arms or legs:     Sudden numbness in arms or legs:     Sudden onset of difficulty speaking or slurred speech:    Temporary loss of vision in one eye:     Problems with dizziness:         Gastrointestinal    Blood in stool:     Vomited blood:         Genitourinary    Burning when urinating:     Blood in urine:        Psychiatric    Major depression:         Hematologic    Bleeding problems:    Problems with blood clotting too easily:        Skin    Rashes or ulcers: x       Constitutional    Fever or chills:      PHYSICAL EXAMINATION:  Today's Vitals   01/05/22 1134  BP: (!) 140/95  Pulse: (!) 104  Temp: 97.6 F (36.4 C)  TempSrc: Temporal  SpO2: 98%  Weight: 181 lb (82.1 kg)  Height: 6' (1.829 m)  PainSc: 8    Body mass index is 24.55 kg/m.   General:  WDWN in NAD; vital signs documented above Gait: Not observed HENT: WNL, normocephalic Pulmonary: normal non-labored breathing , without wheezing Cardiac: irregular HR Abdomen: soft Skin: without rashes Vascular Exam/Pulses: Brisk doppler flow right peroneal and PT and left PT/DP doppler singals Extremities:  Right foot:     Left foot   Musculoskeletal: no muscle wasting or atrophy  Neurologic: A&O X 3  Psychiatric:  The pt is tearful and frustrated   Non-Invasive Vascular Imaging:   ABI's/TBI's on 01/05/2022: Right:  0.82/0.18 - Great toe pressure: 21 Left:  0.55/0.31 - Great toe pressure: 37  Arterial duplex on 01/05/2022: +----------+--------+-----+--------+--------+--------+  RIGHT     PSV cm/sRatioStenosisWaveformComments  +----------+--------+-----+--------+--------+--------+  CFA Distal86                   biphasic           +----------+--------+-----+--------+--------+--------+  SFA Prox  93                   biphasic          +----------+--------+-----+--------+--------+--------+  SFA Mid   127                  biphasic          +----------+--------+-----+--------+--------+--------+  SFA Distal55                   biphasic          +----------+--------+-----+--------+--------+--------+  POP Prox  78                   biphasic          +----------+--------+-----+--------+--------+--------+  POP Distal79                   biphasic          +----------+--------+-----+--------+--------+--------+  ATA Distal25                   biphasic          +----------+--------+-----+--------+--------+--------+  PTA Prox  63                   biphasic          +----------+--------+-----+--------+--------+--------+  PTA Mid   59                   biphasic          +----------+--------+-----+--------+--------+--------+  PTA Distal57                   biphasic          +----------+--------+-----+--------+--------+--------+   Summary:  Right: Widely patent right lower extremity arterial system.  Widely patent posterior tibial artery.  Of note: No evidence of DVT in the right leg.   Previous ABI's/TBI's on 12/20/2021: Right:  0.92/unable to obtain - Great toe pressure: unable to obtain Left:  0.81/0.47 - Great toe pressure:  62  Previous arterial duplex on 10/18/2021: Summary:  Right: Right lower extremity arterial system appears patent. Posterior tibial artery patent post-angioplasty.    ASSESSMENT/PLAN:: 61 y.o. male here for follow up for PAD with hx of right foot wounds with angiogram June 2023 and found to have single vessel runoff via the peroneal artery.  The PTA was recanalized.  On 09/04/2021 he underwent right 4th toe ray amputation by Dr. [name].  He had new blister on right foot and underwent repeat angiogram with angioplasty right PTA on  12/28/2021 and he returns today with c/o continued right foot pain with worsening foot wound.    -pt seen with Dr. Randie Heinz today.  Pt has brisk doppler flow in right PT and peroneal and the RLE arterial duplex today is widely patent.  Dr. Randie Heinz discussed with pt that he is going to require at very least toe amputation and possibly TMA.   I discussed with pt that  there is a chance that either of these do not heal and he would require a below knee amputation if that happens.  He expresses understanding and appreciates being up front with him.   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  We will schedule this as soon as possible after the 15th but he knows to call sooner if his right foot wounds or pain worsens.  Pt prefers Dr. Myra Gianotti to perform the surgery. -he will continue his Bactrim.  He has a couple days left with a refill.  -will give him a refill on his Norco #20 with no refill.      Doreatha Massed, [facility] [phone]  Clinic MD:   Randie Heinz

## 2022-01-04 NOTE — Telephone Encounter (Signed)
Needs to see Cardiology for refills

## 2022-01-05 ENCOUNTER — Other Ambulatory Visit: Payer: Self-pay

## 2022-01-05 ENCOUNTER — Ambulatory Visit (HOSPITAL_COMMUNITY)
Admission: RE | Admit: 2022-01-05 | Discharge: 2022-01-05 | Disposition: A | Discharge status: Home / Self Care | Payer: Self-pay | Source: Ambulatory Visit | Attending: Surgery | Admitting: Surgery

## 2022-01-05 ENCOUNTER — Ambulatory Visit (INDEPENDENT_AMBULATORY_CARE_PROVIDER_SITE_OTHER)
Admission: RE | Admit: 2022-01-05 | Discharge: 2022-01-05 | Disposition: A | Discharge status: Home / Self Care | Payer: Self-pay | Source: Ambulatory Visit | Attending: Surgery | Admitting: Surgery

## 2022-01-05 ENCOUNTER — Ambulatory Visit (INDEPENDENT_AMBULATORY_CARE_PROVIDER_SITE_OTHER): Payer: Self-pay | Admitting: Physician Assistant

## 2022-01-05 ENCOUNTER — Telehealth: Payer: Self-pay

## 2022-01-05 VITALS — BP 140/95 | HR 104 | Temp 97.6°F | Ht 72.0 in | Wt 181.0 lb

## 2022-01-05 DIAGNOSIS — I70221 Atherosclerosis of native arteries of extremities with rest pain, right leg: Secondary | ICD-10-CM

## 2022-01-05 DIAGNOSIS — I739 Peripheral vascular disease, unspecified: Secondary | ICD-10-CM

## 2022-01-05 DIAGNOSIS — I7025 Atherosclerosis of native arteries of other extremities with ulceration: Secondary | ICD-10-CM | POA: Insufficient documentation

## 2022-01-05 MED ORDER — HYDROCODONE-ACETAMINOPHEN 5-325 MG PO TABS: 1.0000 | ORAL_TABLET | Freq: Two times a day (BID) | ORAL | 0 refills | Status: DC | PRN

## 2022-01-05 NOTE — Telephone Encounter (Signed)
Patient called to inquire how much time to expect out of work for his upcoming surgery, so he can inform his employer. Advised patient the estimated time out of work for a toe or transmetatarsal amputation is 4-6 weeks, but may vary. Patient verbalized understanding.

## 2022-01-17 ENCOUNTER — Ambulatory Visit: Payer: Self-pay | Admitting: Orthopedic Surgery

## 2022-01-17 ENCOUNTER — Other Ambulatory Visit: Payer: Self-pay | Admitting: Vascular Surgery

## 2022-01-17 ENCOUNTER — Other Ambulatory Visit: Payer: Self-pay

## 2022-01-17 ENCOUNTER — Encounter (HOSPITAL_COMMUNITY): Payer: Self-pay

## 2022-01-17 ENCOUNTER — Ambulatory Visit: Payer: Self-pay

## 2022-01-17 ENCOUNTER — Ambulatory Visit: Payer: Self-pay | Admitting: *Deleted

## 2022-01-17 DIAGNOSIS — E08621 Diabetes mellitus due to underlying condition with foot ulcer: Secondary | ICD-10-CM

## 2022-01-17 NOTE — Telephone Encounter (Signed)
Referral has been placed. 

## 2022-01-17 NOTE — Addendum Note (Signed)
Addended by: Charlott Rakes on: 01/17/2022 05:58 PM   Modules accepted: Orders

## 2022-01-17 NOTE — Telephone Encounter (Signed)
  Chief Complaint: Pt is for surgery on 01/26/2022 to have his toe amputated.   He is requesting a 2nd opinion from the [facility].   He does not feel like he is getting good care from the vascular dr he is seeing now. Symptoms: A ulcer on the bottom of his foot.   They want to amputate my toe and 1/2 the bottom of my foot.    I don't want to do this unless it's really necessary. Frequency: ASAP requesting a referral Pertinent Negatives: Patient denies N/A Disposition: [] ED /[] Urgent Care (no appt availability in office) / [] Appointment(In office/virtual)/ []  [facility]/ [] Home Care/ [] Refused Recommended Disposition /[] [place] [facility]/ [x]  Follow-up with PCP Additional Notes: I've sent a high priority message to Dr. Margarita Rana at [facility] regarding his referral request.   There are no appts available with Dr. Margarita Rana in the timeframe he is requesting.    Pt. Agreeable to being called back.

## 2022-01-17 NOTE — Telephone Encounter (Signed)
Pt line disconnected when agent transferred him to me.    I called him back.   Reason for Disposition  [1] Caller requesting NON-URGENT health information AND [2] PCP's office is the best resource    Requesting a 2nd opinion.   Surgery is for 01/26/2022  Answer Assessment - Initial Assessment Questions 1. REASON FOR CALL or QUESTION: "What is your reason for calling today?" or "How can I best help you?" or "What question do you have that I can help answer?"     He is requesting a referral to the [facility] in [facility].    The vascular dr.here wants to amputate my toe and 1/2 my foot.    Dr. Chaya Jan  wants to amputate my toe.    I want a 2nd opinion.   My surgery is for 01/26/2022 and I need a 2nd opinion before then if at all possible.      I have a horrible ulcer on the bottom of my foot now.     There are no appts available with Dr. Margarita Rana so I'm sending a high priority message to her.    Pt said Dr. Margarita Rana is not familiar with this foot situation but she is his PCP.  Protocols used: Information Only Call - No Triage-A-AH

## 2022-01-18 ENCOUNTER — Other Ambulatory Visit: Payer: Self-pay

## 2022-01-18 MED ORDER — SULFAMETHOXAZOLE-TRIMETHOPRIM 800-160 MG PO TABS
1.0000 | ORAL_TABLET | Freq: Two times a day (BID) | ORAL | 0 refills | Status: DC
  Filled 2022-01-18: qty 28, 14d supply, fill #0

## 2022-01-24 ENCOUNTER — Telehealth: Payer: Self-pay

## 2022-01-24 NOTE — Telephone Encounter (Signed)
Received a call from patient who was scheduled to have a right 5th toe amputation vs TMA on 01/26/22 but requested to cancel because he was getting a second opinion. Dr. Trula Slade made aware.

## 2022-01-26 ENCOUNTER — Ambulatory Visit: Admit: 2022-01-26 | Payer: Self-pay | Admitting: Surgery

## 2022-01-26 SURGERY — AMPUTATION DIGIT
Anesthesia: Choice | Laterality: Right

## 2022-02-01 ENCOUNTER — Other Ambulatory Visit: Payer: Self-pay

## 2022-02-01 ENCOUNTER — Other Ambulatory Visit: Payer: Self-pay | Admitting: Nurse Practitioner

## 2022-02-01 DIAGNOSIS — R0981 Nasal congestion: Secondary | ICD-10-CM

## 2022-02-01 MED ORDER — FLUTICASONE PROPIONATE 50 MCG/ACT NA SUSP
2.0000 | Freq: Every day | NASAL | 2 refills | Status: DC
  Filled 2022-02-08 – 2022-02-15 (×2): qty 16, 30d supply, fill #0
  Filled 2022-03-18 (×2): qty 16, 30d supply, fill #1
  Filled 2022-06-09: qty 16, 30d supply, fill #2

## 2022-02-08 ENCOUNTER — Other Ambulatory Visit: Payer: Self-pay

## 2022-02-15 ENCOUNTER — Other Ambulatory Visit: Payer: Self-pay

## 2022-02-16 ENCOUNTER — Other Ambulatory Visit: Payer: Self-pay

## 2022-02-17 ENCOUNTER — Other Ambulatory Visit: Payer: Self-pay

## 2022-02-21 ENCOUNTER — Other Ambulatory Visit: Payer: Self-pay

## 2022-02-22 ENCOUNTER — Other Ambulatory Visit: Payer: Self-pay

## 2022-03-18 ENCOUNTER — Other Ambulatory Visit (HOSPITAL_COMMUNITY): Payer: Self-pay

## 2022-03-18 ENCOUNTER — Other Ambulatory Visit: Payer: Self-pay

## 2022-03-21 ENCOUNTER — Ambulatory Visit: Payer: Self-pay | Admitting: Family Medicine

## 2022-03-22 ENCOUNTER — Other Ambulatory Visit: Payer: Self-pay

## 2022-03-24 ENCOUNTER — Other Ambulatory Visit: Payer: Self-pay

## 2022-03-24 ENCOUNTER — Telehealth: Payer: Self-pay | Admitting: Cardiovascular Disease

## 2022-03-24 MED ORDER — HYDROCODONE-ACETAMINOPHEN 5-325 MG PO TABS
ORAL_TABLET | ORAL | 0 refills | Status: DC
  Filled 2022-03-24: qty 35, 7d supply, fill #0

## 2022-03-24 MED ORDER — FAMOTIDINE 20 MG PO TABS
ORAL_TABLET | ORAL | 2 refills | Status: DC
  Filled 2022-03-24: qty 60, 30d supply, fill #0
  Filled 2022-05-16: qty 60, 30d supply, fill #1
  Filled ????-??-??: fill #2

## 2022-03-24 NOTE — Telephone Encounter (Signed)
Patient is calling to inform the office he is currently admitted in [facility] medical center and is hoping to be released tomorrow. He states he will callback after Christmas to schedule his overdue follow up and labs.

## 2022-04-13 ENCOUNTER — Other Ambulatory Visit: Payer: Self-pay | Admitting: Physician Assistant

## 2022-04-13 ENCOUNTER — Other Ambulatory Visit: Payer: Self-pay

## 2022-04-13 ENCOUNTER — Other Ambulatory Visit (HOSPITAL_COMMUNITY): Payer: Self-pay

## 2022-04-13 ENCOUNTER — Other Ambulatory Visit: Payer: Self-pay | Admitting: Family Medicine

## 2022-04-13 DIAGNOSIS — Z794 Long term (current) use of insulin: Secondary | ICD-10-CM

## 2022-04-13 MED ORDER — BASAGLAR KWIKPEN 100 UNIT/ML ~~LOC~~ SOPN
40.0000 [IU] | PEN_INJECTOR | Freq: Every day | SUBCUTANEOUS | 3 refills | Status: DC
  Filled 2022-04-13 (×4): qty 9, 22d supply, fill #0
  Filled 2022-05-16: qty 9, 22d supply, fill #1
  Filled 2022-06-09: qty 12, 30d supply, fill #2
  Filled 2022-06-09 – 2022-06-20 (×2): qty 9, 22d supply, fill #2

## 2022-04-13 MED ORDER — CLOPIDOGREL BISULFATE 75 MG PO TABS
75.0000 mg | ORAL_TABLET | Freq: Every day | ORAL | 2 refills | Status: DC
  Filled 2022-04-13 – 2022-05-16 (×5): qty 30, 30d supply, fill #0
  Filled 2022-06-20: qty 30, 30d supply, fill #1
  Filled ????-??-??: fill #1

## 2022-04-13 MED ORDER — INSULIN LISPRO (1 UNIT DIAL) 100 UNIT/ML (KWIKPEN)
15.0000 [IU] | PEN_INJECTOR | Freq: Three times a day (TID) | SUBCUTANEOUS | 3 refills | Status: DC
  Filled 2022-04-13 (×4): qty 9, 20d supply, fill #0
  Filled 2022-05-16: qty 9, 20d supply, fill #1
  Filled 2022-06-09: qty 9, 20d supply, fill #2
  Filled 2022-06-09: qty 15, 34d supply, fill #2
  Filled 2022-06-20: qty 9, 20d supply, fill #2

## 2022-04-13 NOTE — Telephone Encounter (Signed)
Requested Prescriptions  Pending Prescriptions Disp Refills   clopidogrel (PLAVIX) 75 MG tablet 30 tablet 2    Sig: Take 1 tablet (75 mg total) by mouth daily with breakfast.     Hematology: Antiplatelets - clopidogrel Failed - 04/13/2022  2:47 PM      Failed - Cr in normal range and within 360 days    Creat  Date Value Ref Range Status  11/12/2021 1.73 (H) 0.70 - 1.35 mg/dL Final   Creatinine, Ser  Date Value Ref Range Status  12/28/2021 1.40 (H) 0.61 - 1.24 mg/dL Final   Creatinine, Urine  Date Value Ref Range Status  08/30/2021 38.98 mg/dL Final    Comment:    Performed at [facility], [place] [address], [place], [place] [address]         Passed - HCT in normal range and within 180 days    HCT  Date Value Ref Range Status  12/28/2021 46.0 39.0 - 52.0 % Final   Hematocrit  Date Value Ref Range Status  06/16/2021 47.0 37.5 - 51.0 % Final         Passed - HGB in normal range and within 180 days    Hemoglobin  Date Value Ref Range Status  12/28/2021 15.6 13.0 - 17.0 g/dL Final  06/16/2021 17.3 13.0 - 17.7 g/dL Final         Passed - PLT in normal range and within 180 days    Platelets  Date Value Ref Range Status  11/12/2021 261 140 - 400 Thousand/uL Final  06/16/2021 244 150 - 450 x10E3/uL Final         Passed - Valid encounter within last 6 months    Recent Outpatient Visits           3 months ago Type 2 diabetes mellitus with hyperglycemia, with long-term current use of insulin [facility])   [place], [place], [place]   1 year ago Type 2 diabetes mellitus with hyperglycemia, with long-term current use of insulin [facility])   [place], Vernia Buff, NP   1 year ago Essential hypertension   [place], [place] W, NP   2 years ago Diabetes mellitus type 2, uncontrolled, with complications [facility])   [place], [place],  [place]   3 years ago Diabetes mellitus type 2, uncontrolled, with complications [facility])   [place] [address], Vernia Buff, NP

## 2022-04-13 NOTE — Telephone Encounter (Signed)
Requested Prescriptions  Pending Prescriptions Disp Refills   Insulin Glargine (BASAGLAR KWIKPEN) 100 UNIT/ML 9 mL 3    Sig: Inject 40 Units into the skin daily.     Endocrinology:  Diabetes - Insulins Failed - 04/13/2022  2:47 PM      Failed - HBA1C is between 0 and 7.9 and within 180 days    Hgb A1c MFr Bld  Date Value Ref Range Status  12/14/2021 8.8 (H) 4.8 - 5.6 % Final    Comment:             Prediabetes: 5.7 - 6.4          Diabetes: >6.4          Glycemic control for adults with diabetes: <7.0          Passed - Valid encounter within last 6 months    Recent Outpatient Visits           3 months ago Type 2 diabetes mellitus with hyperglycemia, with long-term current use of insulin [facility])   [place], [place], [place]   1 year ago Type 2 diabetes mellitus with hyperglycemia, with long-term current use of insulin [facility])   [place], Vernia Buff, NP   1 year ago Essential hypertension   [place], [place] W, NP   2 years ago Diabetes mellitus type 2, uncontrolled, with complications [facility])   [place], [place], [place]   3 years ago Diabetes mellitus type 2, uncontrolled, with complications [name])   [place], [place] W, NP               insulin lispro (HUMALOG KWIKPEN) 100 UNIT/ML KwikPen 9 mL 3    Sig: Inject 15 Units into the skin 3 (three) times daily.     Endocrinology:  Diabetes - Insulins Failed - 04/13/2022  2:47 PM      Failed - HBA1C is between 0 and 7.9 and within 180 days    Hgb A1c MFr Bld  Date Value Ref Range Status  12/14/2021 8.8 (H) 4.8 - 5.6 % Final    Comment:             Prediabetes: 5.7 - 6.4          Diabetes: >6.4          Glycemic control for adults with diabetes: <7.0          Passed - Valid encounter within last 6 months    Recent Outpatient Visits            3 months ago Type 2 diabetes mellitus with hyperglycemia, with long-term current use of insulin [facility])   [place], [place], [place]   1 year ago Type 2 diabetes mellitus with hyperglycemia, with long-term current use of insulin [facility])   [place], Vernia Buff, NP   1 year ago Essential hypertension   [place], [place] W, NP   2 years ago Diabetes mellitus type 2, uncontrolled, with complications [facility])   [place], [place], [place]   3 years ago Diabetes mellitus type 2, uncontrolled, with complications [facility])   [place], Vernia Buff, NP

## 2022-04-14 ENCOUNTER — Other Ambulatory Visit: Payer: Self-pay

## 2022-04-15 ENCOUNTER — Other Ambulatory Visit: Payer: Self-pay

## 2022-04-25 ENCOUNTER — Other Ambulatory Visit: Payer: Self-pay

## 2022-04-27 ENCOUNTER — Other Ambulatory Visit: Payer: Self-pay

## 2022-04-27 NOTE — Telephone Encounter (Signed)
PT was seen on 01/05/2022

## 2022-05-02 ENCOUNTER — Telehealth: Payer: Self-pay | Admitting: Cardiology

## 2022-05-02 NOTE — Telephone Encounter (Signed)
Sample request for Eliquis received. Indication: Afib  Last office visit: 10/06/21 [name])  Scr: 1.37 (03/24/22)  Age: 62 Weight: 82.1kg  Eliquis 5mg  BID is appropriate dose.

## 2022-05-02 NOTE — Telephone Encounter (Signed)
Patient calling the office for samples of medication: ° ° °1.  What medication and dosage are you requesting samples for? Eliquis ° °2.  Are you currently out of this medication?  2 days left ° ° ° °

## 2022-05-03 NOTE — Telephone Encounter (Signed)
Called pt and pt informed me that he was ok will his medication for now and that he would let our office know if he needs samples. I advised the pt that if he has any other problems, questions or concerns, to give our office a call back. Pt verbalized understanding.

## 2022-05-16 ENCOUNTER — Other Ambulatory Visit: Payer: Self-pay | Admitting: Physician Assistant

## 2022-05-16 ENCOUNTER — Other Ambulatory Visit: Payer: Self-pay

## 2022-05-16 DIAGNOSIS — E1165 Type 2 diabetes mellitus with hyperglycemia: Secondary | ICD-10-CM

## 2022-05-16 DIAGNOSIS — I480 Paroxysmal atrial fibrillation: Secondary | ICD-10-CM

## 2022-05-16 DIAGNOSIS — Z794 Long term (current) use of insulin: Secondary | ICD-10-CM

## 2022-05-16 DIAGNOSIS — I429 Cardiomyopathy, unspecified: Secondary | ICD-10-CM

## 2022-05-17 ENCOUNTER — Ambulatory Visit: Payer: Self-pay | Admitting: Medical

## 2022-05-17 ENCOUNTER — Other Ambulatory Visit: Payer: Self-pay

## 2022-05-17 MED ORDER — CARVEDILOL 3.125 MG PO TABS
3.1250 mg | ORAL_TABLET | Freq: Two times a day (BID) | ORAL | 0 refills | Status: DC
  Filled 2022-05-17: qty 180, 90d supply, fill #0

## 2022-05-17 MED ORDER — FUROSEMIDE 40 MG PO TABS
40.0000 mg | ORAL_TABLET | Freq: Every day | ORAL | 0 refills | Status: DC
  Filled 2022-05-17: qty 90, 90d supply, fill #0

## 2022-05-17 MED ORDER — ATORVASTATIN CALCIUM 80 MG PO TABS
80.0000 mg | ORAL_TABLET | Freq: Every day | ORAL | 0 refills | Status: DC
  Filled 2022-05-17: qty 90, 90d supply, fill #0

## 2022-05-17 NOTE — Telephone Encounter (Signed)
Requested Prescriptions  Pending Prescriptions Disp Refills   furosemide (LASIX) 40 MG tablet 90 tablet 0    Sig: Take 1 tablet (40 mg total) by mouth daily.     Cardiovascular:  Diuretics - Loop Failed - 05/16/2022  1:00 PM      Failed - Cr in normal range and within 180 days    Creat  Date Value Ref Range Status  11/12/2021 1.73 (H) 0.70 - 1.35 mg/dL Final   Creatinine, Ser  Date Value Ref Range Status  12/28/2021 1.40 (H) 0.61 - 1.24 mg/dL Final   Creatinine, Urine  Date Value Ref Range Status  08/30/2021 38.98 mg/dL Final    Comment:    Performed at [facility], [place] [address] [place], [place] [address]         Failed - Mg Level in normal range and within 180 days    Magnesium  Date Value Ref Range Status  08/30/2021 2.0 1.7 - 2.4 mg/dL Final    Comment:    Performed at [facility], [place] [address] [place], [place] [address]         Failed - Last BP in normal range    BP Readings from Last 1 Encounters:  01/05/22 (!) 140/95         Passed - K in normal range and within 180 days    Potassium  Date Value Ref Range Status  12/28/2021 5.0 3.5 - 5.1 mmol/L Final         Passed - Ca in normal range and within 180 days    Calcium  Date Value Ref Range Status  11/12/2021 9.7 8.6 - 10.3 mg/dL Final   Calcium, Ion  Date Value Ref Range Status  12/28/2021 1.39 1.15 - 1.40 mmol/L Final         Passed - Na in normal range and within 180 days    Sodium  Date Value Ref Range Status  12/28/2021 135 135 - 145 mmol/L Final  06/16/2021 131 (L) 134 - 144 mmol/L Final         Passed - Cl in normal range and within 180 days    Chloride  Date Value Ref Range Status  12/28/2021 99 98 - 111 mmol/L Final         Passed - Valid encounter within last 6 months    Recent Outpatient Visits           5 months ago Type 2 diabetes mellitus with hyperglycemia, with long-term current use of insulin [facility])   [place],  [place], [place]   1 year ago Type 2 diabetes mellitus with hyperglycemia, with long-term current use of insulin [facility])   [place], Vernia Buff, NP   1 year ago Essential hypertension   [place], [place] W, NP   2 years ago Diabetes mellitus type 2, uncontrolled, with complications [facility])   [place], [place], [place]   3 years ago Diabetes mellitus type 2, uncontrolled, with complications [facility])   [place], Vernia Buff, NP       Future Appointments             Today Furth, Cadence H, PA-C [place] at [facility]             atorvastatin (LIPITOR) 80 MG  tablet 90 tablet 0    Sig: Take 1 tablet (80 mg total) by mouth daily.     Cardiovascular:  Antilipid - Statins Failed - 05/16/2022  1:00 PM      Failed - Lipid Panel in normal range within the last 12 months    Cholesterol, Total  Date Value Ref Range Status  12/14/2021 213 (H) 100 - 199 mg/dL Final   LDL Chol Calc (NIH)  Date Value Ref Range Status  12/14/2021 142 (H) 0 - 99 mg/dL Final   HDL  Date Value Ref Range Status  12/14/2021 36 (L) >39 mg/dL Final   Triglycerides  Date Value Ref Range Status  12/14/2021 191 (H) 0 - 149 mg/dL Final         Passed - Patient is not pregnant      Passed - Valid encounter within last 12 months    Recent Outpatient Visits           5 months ago Type 2 diabetes mellitus with hyperglycemia, with long-term current use of insulin [facility])   [place], [place], [place]   1 year ago Type 2 diabetes mellitus with hyperglycemia, with long-term current use of insulin [facility])   [place], Vernia Buff, NP   1 year ago Essential hypertension   [place], [place] W, NP   2 years ago Diabetes  mellitus type 2, uncontrolled, with complications [facility])   [place], [place], [place]   3 years ago Diabetes mellitus type 2, uncontrolled, with complications [facility])   [place], [place], NP       Future Appointments             Today Furth, Cadence H, PA-C [place] at [facility]             carvedilol (COREG) 3.125 MG tablet 180 tablet 0    Sig: Take 1 tablet (3.125 mg total) by mouth 2 (two) times daily with a meal.     Cardiovascular: Beta Blockers 3 Failed - 05/16/2022  1:00 PM      Failed - Cr in normal range and within 360 days    Creat  Date Value Ref Range Status  11/12/2021 1.73 (H) 0.70 - 1.35 mg/dL Final   Creatinine, Ser  Date Value Ref Range Status  12/28/2021 1.40 (H) 0.61 - 1.24 mg/dL Final   Creatinine, Urine  Date Value Ref Range Status  08/30/2021 38.98 mg/dL Final    Comment:    Performed at [facility], [place] [address], [place], [place] [address]         Failed - Last BP in normal range    BP Readings from Last 1 Encounters:  01/05/22 (!) 140/95         Passed - AST in normal range and within 360 days    AST  Date Value Ref Range Status  11/12/2021 19 10 - 35 U/L Final         Passed - ALT in normal range and within 360 days    ALT  Date Value Ref Range Status  11/12/2021 19 9 - 46 U/L Final         Passed - Last Heart Rate in normal range    Pulse Readings from Last 1 Encounters:  01/05/22 (!) 104  Passed - Valid encounter within last 6 months    Recent Outpatient Visits           5 months ago Type 2 diabetes mellitus with hyperglycemia, with long-term current use of insulin [facility])   [place], [place], [place]   1 year ago Type 2 diabetes mellitus with hyperglycemia, with long-term current use of insulin [facility])   [place], Vernia Buff,  NP   1 year ago Essential hypertension   [place], [place] W, NP   2 years ago Diabetes mellitus type 2, uncontrolled, with complications [facility])   [place], [place], [place]   3 years ago Diabetes mellitus type 2, uncontrolled, with complications [facility])   [place], Vernia Buff, NP       Future Appointments             Today Kathlen Mody, Cadence H, PA-C [place] at [facility]

## 2022-05-17 NOTE — Progress Notes (Deleted)
Cardiology Office Note:    Date:  05/17/2022   ID:  David [name], DOB 1960/07/31, MRN [id number]  PCP:  Charlott Rakes, MD  [facility] Cardiologist:  Jenkins Rouge, MD  [facility] Electrophysiologist:  None   Referring MD: Charlott Rakes, MD   Chief Complaint: ***  History of Present Illness:    David [name] is a 62 y.o. 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   Patient has a h/o afib with failed cardioversion in 09/2017. Attempted Sotalol with plan to repeat DCCV, however this was no done.   H/o HFrEF suspected tachy-mediated, with low EF in the setting of Afib. Myoview in 2019 showed ?mild anterolateral ischemia. He was admitted in7/2020 with AF wit hRVR and low EF. Plan was to attempt rate control and follow-up echo in 3 months. He has had poor follow-up.   He was seen 06/2021 and reported shortness of brath. Labs were drawn, echo and myoview lexiscan were ordered. He reported he stopped taking Sotalol. May need to see EP.   Today  Past Medical History:  Diagnosis Date   Cardiomyopathy ([place])    a. EF 45% in 2019.   CHF (congestive heart failure) (HCC)    Chronic anticoagulation 09/30/2017   Diabetes mellitus type 2 in nonobese [facility])    Does not have health insurance    Essential hypertension 09/26/2017   Financial difficulties    H/O noncompliance with medical treatment, presenting hazards to health    Non-insulin treated type 2 diabetes mellitus ([place]) 09/26/2017   Persistent atrial fibrillation ([place])    Sleep apnea suspected 09/30/2017    Past Surgical History:  Procedure Laterality Date   ABDOMINAL AORTOGRAM W/LOWER EXTREMITY N/A 09/02/2021   Procedure: ABDOMINAL AORTOGRAM W/LOWER EXTREMITY;  Surgeon: Serafina Mitchell, MD;  Location: [place] [facility];  Service: Cardiovascular;  Laterality: N/A;   ABDOMINAL AORTOGRAM W/LOWER EXTREMITY N/A 12/28/2021    Procedure: ABDOMINAL AORTOGRAM W/LOWER EXTREMITY;  Surgeon: Serafina Mitchell, MD;  Location: [place] [facility];  Service: Cardiovascular;  Laterality: N/A;   AMPUTATION Right 09/04/2021   Procedure: 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;  Surgeon: Newt Minion, MD;  Location: [place];  Service: Orthopedics;  Laterality: Right;   APPENDECTOMY  1971   CARDIOVERSION N/A 09/28/2017   Procedure: CARDIOVERSION;  Surgeon: Sanda Klein, MD;  Location: [place];  Service: Cardiovascular;  Laterality: N/A;   PERIPHERAL VASCULAR BALLOON ANGIOPLASTY  09/02/2021   Procedure: PERIPHERAL VASCULAR BALLOON ANGIOPLASTY;  Surgeon: Serafina Mitchell, MD;  Location: [place] [facility];  Service: Cardiovascular;;   PERIPHERAL VASCULAR BALLOON ANGIOPLASTY  12/28/2021   Procedure: PERIPHERAL VASCULAR BALLOON ANGIOPLASTY;  Surgeon: Serafina Mitchell, MD;  Location: [place] [facility];  Service: Cardiovascular;;  Posterior Tibial PTA only   TEE WITHOUT CARDIOVERSION N/A 09/28/2017   Procedure: TRANSESOPHAGEAL ECHOCARDIOGRAM (TEE);  Surgeon: Sanda Klein, MD;  Location: [place];  Service: Cardiovascular;  Laterality: N/A;   TEE WITHOUT CARDIOVERSION N/A 09/03/2021   Procedure: TRANSESOPHAGEAL ECHOCARDIOGRAM (TEE);  Surgeon: Skeet Latch, MD;  Location[facility]OS[facility];  Service: Cardiovascular;  Laterality: N/A;    Current Medications: No outpatient medications have been marked as taking for the 05/17/22 encounter (Appointment) with Kathlen Mody, Georganne Siple H, PA-C.     Allergies:   Patient has no known allergies.   Social History   Socioeconomic History   Marital status: Married    Spouse name: Not  on file   Number of children: Not on file   Years of education: Not on file   Highest education level: Not on file  Occupational History   Not on file  Tobacco Use   Smoking status: Former    Years: 15.00    Types: Cigarettes    Quit date: 03/18/2018    Years since quitting: 4.1   Smokeless tobacco: Never   Tobacco comments:     09/26/2017 "2-3 cigarettes/month now"  Vaping Use   Vaping Use: Never used  Substance and Sexual Activity   Alcohol use: Not Currently    Alcohol/week: 3.0 standard drinks of alcohol    Types: 3 Cans of beer per week    Comment: States he quit drinking 8-9 months ago   Drug use: Never   Sexual activity: Not Currently  Other Topics Concern   Not on file  Social History Narrative   Not on file   Social Determinants of Health   Financial Resource Strain: Not on file  Food Insecurity: Not on file  Transportation Needs: Unmet Transportation Needs (09/09/2021)   PRAPARE - Transportation    Lack of Transportation (Medical): Yes    Lack of Transportation (Non-Medical): Yes  Physical Activity: Not on file  Stress: Stress Concern Present (09/26/2017)   [place]    Feeling of Stress : Very much  Social Connections: Not on file     Family History: The patient's ***family history includes Hypertension in his mother.  ROS:   Please see the history of present illness.    *** All other systems reviewed and are negative.  EKGs/Labs/Other Studies Reviewed:    The following studies were reviewed today: ***  EKG:  EKG is *** ordered today.  The ekg ordered today demonstrates ***  Recent Labs: 06/16/2021: NT-Pro BNP 468 08/30/2021: Magnesium 2.0; TSH 0.837 11/12/2021: ALT 19; Platelets 261 12/28/2021: BUN 32; Creatinine, Ser 1.40; Hemoglobin 15.6; Potassium 5.0; Sodium 135  Recent Lipid Panel    Component Value Date/Time   CHOL 213 (H) 12/14/2021 1025   TRIG 191 (H) 12/14/2021 1025   HDL 36 (L) 12/14/2021 1025   CHOLHDL 5.9 (H) 12/14/2021 1025   CHOLHDL 6.6 09/03/2021 0314   VLDL 50 (H) 09/03/2021 0314   LDLCALC 142 (H) 12/14/2021 1025     Risk Assessment/Calculations:   {Does this patient have ATRIAL FIBRILLATION?:[phone]}   Physical Exam:    VS:  There were no vitals taken for this visit.    Wt  Readings from Last 3 Encounters:  01/05/22 181 lb (82.1 kg)  12/28/21 185 lb (83.9 kg)  12/20/21 177 lb 8 oz (80.5 kg)     GEN: *** Well nourished, well developed in no acute distress HEENT: Normal NECK: No JVD; No carotid bruits LYMPHATICS: No lymphadenopathy CARDIAC: ***RRR, no murmurs, rubs, gallops RESPIRATORY:  Clear to auscultation without rales, wheezing or rhonchi  ABDOMEN: Soft, non-tender, non-distended MUSCULOSKELETAL:  No edema; No deformity  SKIN: Warm and dry NEUROLOGIC:  Alert and oriented x 3 PSYCHIATRIC:  Normal affect   ASSESSMENT:    No diagnosis found. PLAN:    In order of problems listed above:  ***  Disposition: Follow up {follow up:15908} with ***   Shared Decision Making/Informed Consent   {Are you ordering a CV Procedure (e.g. stress test, cath, DCCV, TEE, etc)?   Press F2        :[id number]    Signed, Adib Wahba Ninfa Meeker, PA-C  05/17/2022 9:51 AM    [facility]

## 2022-05-27 ENCOUNTER — Telehealth: Payer: Self-pay | Admitting: Cardiovascular Disease

## 2022-05-27 NOTE — Telephone Encounter (Signed)
Pt c/o medication issue:  1. Name of Medication: apixaban (ELIQUIS) 5 MG TABS tablet   2. How are you currently taking this medication (dosage and times per day)?    3. Are you having a reaction (difficulty breathing--STAT)? no  4. What is your medication issue? Patient is calling to see if our office have any samples. Please advise

## 2022-05-27 NOTE — Telephone Encounter (Signed)
Do not see any insurance coverage on file. Recommend patient apply for patient assistance. Please print application. He can have Eliquis '5mg'$  samples.

## 2022-05-31 NOTE — Telephone Encounter (Signed)
FYI.... I called pt to inform him that I was leaving him 2 weeks of samples of Eliquis 5 mg tablets and a patient assistance application at the front desk [employer] office, for him to pick up and bring back, as soon as possible for our office to get it process. Pt verbalized understanding.   Lot#  [id number]   Exp:  09/2023

## 2022-06-02 ENCOUNTER — Other Ambulatory Visit: Payer: Self-pay | Admitting: Family Medicine

## 2022-06-02 ENCOUNTER — Other Ambulatory Visit: Payer: Self-pay | Admitting: Physician Assistant

## 2022-06-02 DIAGNOSIS — I480 Paroxysmal atrial fibrillation: Secondary | ICD-10-CM

## 2022-06-02 DIAGNOSIS — E1165 Type 2 diabetes mellitus with hyperglycemia: Secondary | ICD-10-CM

## 2022-06-02 DIAGNOSIS — I429 Cardiomyopathy, unspecified: Secondary | ICD-10-CM

## 2022-06-03 ENCOUNTER — Other Ambulatory Visit: Payer: Self-pay

## 2022-06-03 MED ORDER — DIGOXIN 125 MCG PO TABS
125.0000 ug | ORAL_TABLET | Freq: Every day | ORAL | 3 refills | Status: DC
  Filled 2022-06-03 – 2022-06-20 (×2): qty 30, 30d supply, fill #0

## 2022-06-03 NOTE — Telephone Encounter (Signed)
Unable to refill per protocol, Rx request is too soon. Last refill 05/17/22 for 90 days.  Requested Prescriptions  Pending Prescriptions Disp Refills   furosemide (LASIX) 40 MG tablet 90 tablet 0    Sig: Take 1 tablet (40 mg total) by mouth daily.     Cardiovascular:  Diuretics - Loop Failed - 06/02/2022  7:13 PM      Failed - Cr in normal range and within 180 days    Creat  Date Value Ref Range Status  11/12/2021 1.73 (H) 0.70 - 1.35 mg/dL Final   Creatinine, Ser  Date Value Ref Range Status  12/28/2021 1.40 (H) 0.61 - 1.24 mg/dL Final   Creatinine, Urine  Date Value Ref Range Status  08/30/2021 38.98 mg/dL Final    Comment:    Performed at [facility], [place] [address] [place], [place] [address]         Failed - Mg Level in normal range and within 180 days    Magnesium  Date Value Ref Range Status  08/30/2021 2.0 1.7 - 2.4 mg/dL Final    Comment:    Performed at [facility], [place] [address], [place], [place] [address]         Failed - Last BP in normal range    BP Readings from Last 1 Encounters:  01/05/22 (!) 140/95         Passed - K in normal range and within 180 days    Potassium  Date Value Ref Range Status  12/28/2021 5.0 3.5 - 5.1 mmol/L Final         Passed - Ca in normal range and within 180 days    Calcium  Date Value Ref Range Status  11/12/2021 9.7 8.6 - 10.3 mg/dL Final   Calcium, Ion  Date Value Ref Range Status  12/28/2021 1.39 1.15 - 1.40 mmol/L Final         Passed - Na in normal range and within 180 days    Sodium  Date Value Ref Range Status  12/28/2021 135 135 - 145 mmol/L Final  06/16/2021 131 (L) 134 - 144 mmol/L Final         Passed - Cl in normal range and within 180 days    Chloride  Date Value Ref Range Status  12/28/2021 99 98 - 111 mmol/L Final         Passed - Valid encounter within last 6 months    Recent Outpatient Visits           5 months ago Type 2 diabetes mellitus with hyperglycemia, with long-term  current use of insulin [facility])   [place], [place], [place]   1 year ago Type 2 diabetes mellitus with hyperglycemia, with long-term current use of insulin [facility])   [place], Vernia Buff, NP   1 year ago Essential hypertension   [place], [place] W, NP   2 years ago Diabetes mellitus type 2, uncontrolled, with complications [facility])   [place], [place], [place]   3 years ago Diabetes mellitus type 2, uncontrolled, with complications [facility])   [place], Vernia Buff, NP       Future Appointments             In 4 weeks Miguel Aschoff [facility]  [facility] at [facility]             atorvastatin (LIPITOR) 80 MG tablet 90 tablet 0    Sig: Take 1 tablet (80 mg total) by mouth daily.     Cardiovascular:  Antilipid - Statins Failed - 06/02/2022  7:13 PM      Failed - Lipid Panel in normal range within the last 12 months    Cholesterol, Total  Date Value Ref Range Status  12/14/2021 213 (H) 100 - 199 mg/dL Final   LDL Chol Calc (NIH)  Date Value Ref Range Status  12/14/2021 142 (H) 0 - 99 mg/dL Final   HDL  Date Value Ref Range Status  12/14/2021 36 (L) >39 mg/dL Final   Triglycerides  Date Value Ref Range Status  12/14/2021 191 (H) 0 - 149 mg/dL Final         Passed - Patient is not pregnant      Passed - Valid encounter within last 12 months    Recent Outpatient Visits           5 months ago Type 2 diabetes mellitus with hyperglycemia, with long-term current use of insulin [facility])   [place], [place], [place]   1 year ago Type 2 diabetes mellitus with hyperglycemia, with long-term current use of insulin [facility])   [place], Vernia Buff, NP   1 year ago Essential  hypertension   [place], [place] W, NP   2 years ago Diabetes mellitus type 2, uncontrolled, with complications [facility])   [place], [place], [place]   3 years ago Diabetes mellitus type 2, uncontrolled, with complications [facility])   [place], Vernia Buff, NP       Future Appointments             In 4 weeks Elgie Collard, PA-C [place] at [employer], [facility]             carvedilol (COREG) 3.125 MG tablet 180 tablet 0    Sig: Take 1 tablet (3.125 mg total) by mouth 2 (two) times daily with a meal.     Cardiovascular: Beta Blockers 3 Failed - 06/02/2022  7:13 PM      Failed - Cr in normal range and within 360 days    Creat  Date Value Ref Range Status  11/12/2021 1.73 (H) 0.70 - 1.35 mg/dL Final   Creatinine, Ser  Date Value Ref Range Status  12/28/2021 1.40 (H) 0.61 - 1.24 mg/dL Final   Creatinine, Urine  Date Value Ref Range Status  08/30/2021 38.98 mg/dL Final    Comment:    Performed at [facility], [address], [place], [place] [address]         Failed - Last BP in normal range    BP Readings from Last 1 Encounters:  01/05/22 (!) 140/95         Passed - AST in normal range and within 360 days    AST  Date Value Ref Range Status  11/12/2021 19 10 - 35 U/L Final         Passed - ALT in normal range and within 360 days    ALT  Date Value Ref Range Status  11/12/2021 19 9 - 46 U/L Final  Passed - Last Heart Rate in normal range    Pulse Readings from Last 1 Encounters:  01/05/22 (!) 104         Passed - Valid encounter within last 6 months    Recent Outpatient Visits           5 months ago Type 2 diabetes mellitus with hyperglycemia, with long-term current use of insulin [facility])   [place], [place], [place]   1 year ago Type 2 diabetes mellitus with  hyperglycemia, with long-term current use of insulin [facility])   [place], Vernia Buff, NP   1 year ago Essential hypertension   [place], [place] W, NP   2 years ago Diabetes mellitus type 2, uncontrolled, with complications [facility])   [place], [place], [place]   3 years ago Diabetes mellitus type 2, uncontrolled, with complications [facility])   [place], Vernia Buff, NP       Future Appointments             In 4 weeks Elgie Collard, PA-C [place] at [employer], [place]

## 2022-06-06 ENCOUNTER — Other Ambulatory Visit: Payer: Self-pay

## 2022-06-08 ENCOUNTER — Telehealth: Payer: Self-pay

## 2022-06-08 NOTE — Telephone Encounter (Signed)
**Note De-Identified  Obfuscation** The pts completed [employer]MSPA[employer] application for Eliquis assistance was left at the office with proof of income.  I have completed the providers page of his application and have e-mailed all to Dr Kyla Balzarine nurse so she can obtain his signature, date it, and to then fax all to [facility] at the fax number written on the cover letter included.

## 2022-06-09 ENCOUNTER — Other Ambulatory Visit: Payer: Self-pay

## 2022-06-15 ENCOUNTER — Other Ambulatory Visit: Payer: Self-pay

## 2022-06-16 ENCOUNTER — Other Ambulatory Visit: Payer: Self-pay

## 2022-06-16 ENCOUNTER — Telehealth: Payer: Self-pay | Admitting: Cardiovascular Disease

## 2022-06-16 NOTE — Telephone Encounter (Signed)
Leaving pt 2 weeks of samples of Eliquis 5 mg tablets at the front desk [employer] office for pt to pick up.  Lot# [id number]  Exp: 09/2023   Thanks Jeani Hawking, LPN

## 2022-06-16 NOTE — Telephone Encounter (Addendum)
**Note De-Identified  Obfuscation** I called [employer] and was advised by Carmelina Paddock that the pt did not write how many people live in his household and that the last page of his application which was his W2 (POO) did not fax correctly to them.  Carmelina Paddock states that I can re-fax his W2 to them and that I can write the number of people living in his household on the cover letter. Pt ID: [id number]  I called the pt but got no answer so I left a message asking him to call Jeani Hawking back at Dr Kyla Balzarine office at [phone].

## 2022-06-16 NOTE — Telephone Encounter (Addendum)
**Note De-Identified Lorien Shingler Obfuscation** The pt called me back.  He states that he lives alone. He is aware that I am re-faxing his W2 document to [employer] and that I am writing that only he lives in his household as advised by [place] with [employer].  He thanked me for our assistance with the samples and f/u on his application. Fax: Tx 'ok' Report CONE_EMAIL-to-Fax Vicki Pasqual, Mardene Celeste This message was sent Chris Cripps [facility], a product from [employer] http://www.[employer].com/ [employer] pioneered [employer] and continues to innovate the most advanced and intelligent fax and secure messaging solutions for enterprises. www.biscom.com          -------Fax Transmission Report------- To:               Recipient at [id number] Subject:          Fw: Eliquis Assisatance Result:           The transmission was successful. Explanation:      All Pages Ok Pages Sent:       3 Connect Time:     1 minutes, 58 seconds Transmit Time:    06/16/2022 12:09 Transfer Rate:    14400 Status Code:      0000 Retry Count:      0 Job Id:           [id number] Unique Id:        [id number] Fax Line:         77 Fax Server:       [employer]

## 2022-06-16 NOTE — Telephone Encounter (Signed)
Patient calling the office for samples of medication:   1.  What medication and dosage are you requesting samples for? Eliquis  2.  Are you currently out of this medication? Yes- waiting to hear from pt's assistance

## 2022-06-16 NOTE — Telephone Encounter (Signed)
Wyonia Hough, LPN, can your please advise on this matter? Thanks

## 2022-06-20 ENCOUNTER — Other Ambulatory Visit: Payer: Self-pay

## 2022-06-23 NOTE — Telephone Encounter (Signed)
**Note De-Identified Nitin Mckowen Obfuscation** We received another letter from [facility] Lailie Smead fax stating that they are missing information from the pts application for Eliquis assistance. -We faxed them the pts application on [id number]. -We received a letter from [facility] fax requesting the pts proof of income and his household size which we faxed back to them on 03/14.  I called [employer] and s/w [place] who advised me that the pts initials on his patient agreement and consent page was cut off and they cannot see the entire initials. Per [id number] recommendation, I have re-faxed the the pts agreement and consent page that shows the entire page and his initials. Fax: Tx 'ok' Report CONE_EMAIL-to-Fax  Sonni Barse, Mardene Celeste This message was sent Spyridon Hornstein [facility], a product from [employer] http://www.bis[employer].com/ [employer] pioneered [employer] and continues to innovate the most advanced and intelligent fax and secure messaging solutions for enterprises. www.biscom.com -------Fax Transmission Report-------  To:               Recipient at [id number] Subject:          Fw: Missing initials/ Agreement and Consent Page Result:           The transmission was successful. Explanation:      All Pages Ok Pages Sent:       3 Connect Time:     2 minutes, 56 seconds Transmit Time:    06/23/2022 15:50 Transfer Rate:    14400 Status Code:      0000 Retry Count:      0 Job Id:           [id number] Unique [id number] Fax Line:         35 Fax Server:       [email or web address]

## 2022-07-01 ENCOUNTER — Encounter: Payer: Self-pay | Admitting: *Deleted

## 2022-07-01 ENCOUNTER — Other Ambulatory Visit (HOSPITAL_COMMUNITY): Payer: Self-pay

## 2022-07-01 ENCOUNTER — Ambulatory Visit: Payer: Self-pay | Attending: Family Medicine | Admitting: Physician Assistant

## 2022-07-01 ENCOUNTER — Encounter: Payer: Self-pay | Admitting: Physician Assistant

## 2022-07-01 VITALS — BP 118/92 | HR 106 | Ht 72.0 in | Wt 184.0 lb

## 2022-07-01 DIAGNOSIS — I5022 Chronic systolic (congestive) heart failure: Secondary | ICD-10-CM

## 2022-07-01 DIAGNOSIS — I4819 Other persistent atrial fibrillation: Secondary | ICD-10-CM

## 2022-07-01 DIAGNOSIS — R0602 Shortness of breath: Secondary | ICD-10-CM

## 2022-07-01 DIAGNOSIS — Z79899 Other long term (current) drug therapy: Secondary | ICD-10-CM

## 2022-07-01 DIAGNOSIS — Z794 Long term (current) use of insulin: Secondary | ICD-10-CM

## 2022-07-01 DIAGNOSIS — E7800 Pure hypercholesterolemia, unspecified: Secondary | ICD-10-CM

## 2022-07-01 DIAGNOSIS — E119 Type 2 diabetes mellitus without complications: Secondary | ICD-10-CM

## 2022-07-01 DIAGNOSIS — I10 Essential (primary) hypertension: Secondary | ICD-10-CM

## 2022-07-01 MED ORDER — CARVEDILOL 6.25 MG PO TABS
6.2500 mg | ORAL_TABLET | Freq: Two times a day (BID) | ORAL | 3 refills | Status: DC
  Filled 2022-07-01: qty 60, 30d supply, fill #0

## 2022-07-01 NOTE — Progress Notes (Addendum)
Office Visit    Patient Name: David [name] Date of Encounter: 07/01/2022  PCP:  Charlott Rakes, [facility]  Cardiologist:  Jenkins Rouge, MD  Advanced Practice Provider:  No care team member to display Electrophysiologist:  None   HPI    David [name] is a 62 y.o. male with a hx of diabetes mellitus, persistent atrial fibrillation (failed DCCV 09/2017 and attempted sotalol with plan to repeat DCCV; DCCV not done and patient stopped meds), HFrEF (Myoview in 2019 with mild anterior lateral ischemia), suspected OSA, hypertension presents today for follow-up appointment.    He was admitted 10/2018 with AF with RVR and low EF.  The plan at that time was to attempt rate control and repeat echocardiogram 3 months later to check for improvement.  If his EF did not improve, he would need ischemic eval.  He was lost to follow-up at this time.  He was seen in the clinic in 3/22 and was supposed to return in 3 months.  He returned to the office 3/23 for follow-up visit with David Dopp, PA-C.  He was complaining of fatigue and lethargy for about a year.  He had some shortness of breath when he climbs stairs but no chest pain, syncope, orthopnea, leg edema.  He does not smoke.  He was seen by me 7/23,, he recently had his toe amputated by Dr. Sharol Given due to diabetes.  He states that his toe was infected and it spread his blood.  He had MRSA in his blood and is now on IV antibiotics for 6 weeks through a PICC line.  He states that he has not been very active over the past few weeks because of his toe.  He is a Biomedical scientist for work and hopes that Dr. Sharol Given will clear him on Friday to go back to work.  Due to all this going on, this was why he was never scheduled for his [employer].  I asked the patient to asked Dr. Sharol Given when it would be appropriate to move forward with this test given his current condition.  Before his toe amputation, he admits to not taking his insulin like he should and  not eating like he should.  Recent A1c was over 11.  He has however lost 60 pounds over the last year and has tried to make healthier choices lately.  Once he recovers from his toe surgery he plans to be more active.  He had a new onset diagnosis of atrial fibrillation with a failed cardioversion.  He was supposed to see EP and the appointment was never scheduled.  We will plan for him to follow-up with EP today.  He may be an ablation candidate.  He was also recently started on a statin due to his cholesterol levels when he was in the hospital.  We have arranged for him to get follow-up labs in 8 weeks to check a lipid panel.  He does have some shortness of breath with exertion but this could also be due to deconditioning.  Echocardiogram from his hospitalization reviewed with the patient.  Today, he tells me that he had his toe amputation and then ended up with more infection on the bottom of his foot.  They told him he did not have enough blood flow to heal it up.  They went into his leg and opened some arteries and it was still not healing.  They told him that he was in our practice with amputated.  He went to get a second opinion at the wound center at [facility].  They debrided that but did not cast every week.  It did not have stopped healing and ultimately he got an amputation of his pinky toe on that side.  Now he is finally able to walk and able to go back to work.  Heart: He wants to get this atrial fibrillation under control.  He is in A-fib with RVR rate 106 bpm.  He needs another cardioversion. We have asked him to not miss a dose of Eliquis.  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  He has to do some heavy lifting and is always on the go.  No drinking or smoking.    Reports no shortness of breath nor dyspnea on exertion. Reports no chest pain, pressure, or tightness. No edema, orthopnea, PND.  Past Medical History    Past Medical History:  Diagnosis Date   Cardiomyopathy ([place])     a. EF 45% in 2019.   CHF (congestive heart failure) (HCC)    Chronic anticoagulation 09/30/2017   Diabetes mellitus type 2 in nonobese [facility])    Does not have health insurance    Essential hypertension 09/26/2017   Financial difficulties    H/O noncompliance with medical treatment, presenting hazards to health    Non-insulin treated type 2 diabetes mellitus ([place]) 09/26/2017   Persistent atrial fibrillation ([place])    Sleep apnea suspected 09/30/2017   Past Surgical History:  Procedure Laterality Date   ABDOMINAL AORTOGRAM W/LOWER EXTREMITY N/A 09/02/2021   Procedure: ABDOMINAL AORTOGRAM W/LOWER EXTREMITY;  Surgeon: Serafina Mitchell, MD;  Location: [place] [facility];  Service: Cardiovascular;  Laterality: N/A;   ABDOMINAL AORTOGRAM W/LOWER EXTREMITY N/A 12/28/2021   Procedure: ABDOMINAL AORTOGRAM W/LOWER EXTREMITY;  Surgeon: Serafina Mitchell, MD;  Location: [place] [facility];  Service: Cardiovascular;  Laterality: N/A;   AMPUTATION Right 09/04/2021   Procedure: 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;  Surgeon: Newt Minion, MD;  Location: [place];  Service: Orthopedics;  Laterality: Right;   APPENDECTOMY  1971   CARDIOVERSION N/A 09/28/2017   Procedure: CARDIOVERSION;  Surgeon: Sanda Klein, MD;  Location: [place];  Service: Cardiovascular;  Laterality: N/A;   PERIPHERAL VASCULAR BALLOON ANGIOPLASTY  09/02/2021   Procedure: PERIPHERAL VASCULAR BALLOON ANGIOPLASTY;  Surgeon: Serafina Mitchell, MD;  Location: [place] [facility];  Service: Cardiovascular;;   PERIPHERAL VASCULAR BALLOON ANGIOPLASTY  12/28/2021   Procedure: PERIPHERAL VASCULAR BALLOON ANGIOPLASTY;  Surgeon: Serafina Mitchell, MD;  Location: [place] [facility];  Service: Cardiovascular;;  Posterior Tibial PTA only   TEE WITHOUT CARDIOVERSION N/A 09/28/2017   Procedure: TRANSESOPHAGEAL ECHOCARDIOGRAM (TEE);  Surgeon: Sanda Klein, MD;  Location: [place];  Service: Cardiovascular;  Laterality: N/A;   TEE WITHOUT CARDIOVERSION N/A 09/03/2021    Procedure: TRANSESOPHAGEAL ECHOCARDIOGRAM (TEE);  Surgeon: Skeet Latch, MD;  Location: [place];  Service: Cardiovascular;  Laterality: N/A;    Allergies  No Known Allergies   EKGs/Labs/Other Studies Reviewed:   The following studies were reviewed today:  09/03/2021 echocardiogram  IMPRESSIONS     1. Left ventricular ejection fraction, by estimation, is 60 to 65%. The  left ventricle has normal function. The left ventricle has no regional  wall motion abnormalities.   2. Right ventricular systolic function is normal. The right ventricular  size is normal.   3. No left atrial/left atrial appendage thrombus was detected.   4. The mitral valve is  normal in structure. Trivial mitral valve  regurgitation. No evidence of mitral stenosis.   5. The aortic valve is tricuspid. Aortic valve regurgitation is not  visualized. No aortic stenosis is present.   6. The inferior vena cava is normal in size with greater than 50%  respiratory variability, suggesting right atrial pressure of 3 mmHg.   Conclusion(s)/Recommendation(s): Normal biventricular function without  evidence of hemodynamically significant valvular heart disease.   Echocardiogram 10/17/2018 EF 35-40, moderate LVH, moderately reduced RVSF, mild to moderate MR, RVSP 36.3   Myoview 09/29/2017 Mild mid anterolateral reversibility-equivocal finding, EF 44; intermediate risk Reviewed by Dr. Johnsie Cancel - no infarct; ?mild ant-lat ischemia >> focus on Rx of AFib at that time  EKG:  EKG is ordered today.  Atrial fibrillation with RVR, rate 106 bpm  Recent Labs: 08/30/2021: Magnesium 2.0; TSH 0.837 11/12/2021: ALT 19; Platelets 261 12/28/2021: BUN 32; Creatinine, Ser 1.40; Hemoglobin 15.6; Potassium 5.0; Sodium 135  Recent Lipid Panel    Component Value Date/Time   CHOL 213 (H) 12/14/2021 1025   TRIG 191 (H) 12/14/2021 1025   HDL 36 (L) 12/14/2021 1025   CHOLHDL 5.9 (H) 12/14/2021 1025   CHOLHDL 6.6 09/03/2021 0314    VLDL 50 (H) 09/03/2021 0314   LDLCALC 142 (H) 12/14/2021 1025    Home Medications   Current Meds  Medication Sig   apixaban (ELIQUIS) 5 MG TABS tablet Take 1 tablet (5 mg total) by mouth 2 (two) times daily.   aspirin EC 81 MG tablet Take 1 tablet (81 mg total) by mouth daily. Swallow whole.   atorvastatin (LIPITOR) 80 MG tablet Take 1 tablet (80 mg total) by mouth daily.   busPIRone (BUSPAR) 10 MG tablet Take 1 tablet (10 mg total) by mouth 2 (two) times daily. (Patient taking differently: Take 10 mg by mouth as needed.)   clopidogrel (PLAVIX) 75 MG tablet Take 1 tablet (75 mg total) by mouth daily with breakfast.   digoxin (LANOXIN) 0.125 MG tablet Take 1 tablet (125 mcg total) by mouth once daily.   famotidine (PEPCID) 20 MG tablet Take 1 tablet (20 mg total) by mouth 2 times daily.   fluticasone (FLONASE) 50 MCG/ACT nasal spray Place 2 sprays into both nostrils daily.   furosemide (LASIX) 40 MG tablet Take 1 tablet (40 mg total) by mouth daily.   HYDROcodone-acetaminophen (NORCO/VICODIN) 5-325 MG tablet Take 1 tablet by mouth every 4 (four) hours as needed for up to 7 days for Moderate pain (4-6) or Severe pain (7-10).   Insulin Glargine (BASAGLAR KWIKPEN) 100 UNIT/ML Inject 40 Units into the skin daily.   insulin lispro (HUMALOG KWIKPEN) 100 UNIT/ML KwikPen Inject 15 Units into the skin 3 (three) times daily.   sacubitril-valsartan (ENTRESTO) 49-51 MG Take 1 tablet by mouth 2 (two) times daily. Needs to see cardiology for refills   spironolactone (ALDACTONE) 25 MG tablet Take 1 tablet (25 mg total) by mouth daily.   [DISCONTINUED] carvedilol (COREG) 3.125 MG tablet Take 1 tablet (3.125 mg total) by mouth 2 (two) times daily with a meal.     Review of Systems      All other systems reviewed and are otherwise negative except as noted above.  Physical Exam    VS:  BP (!) 118/92   Pulse (!) 106   Ht 6' (1.829 m)   Wt 184 lb (83.5 kg)   SpO2 93%   BMI 24.95 kg/m  , BMI Body mass  index is 24.95 kg/m.  Wt Readings  from Last 3 Encounters:  07/01/22 184 lb (83.5 kg)  01/05/22 181 lb (82.1 kg)  12/28/21 185 lb (83.9 kg)     GEN: Well nourished, well developed, in no acute distress. HEENT: normal. Neck: Supple, no JVD, carotid bruits, or masses. Cardiac: Irregularly irregular, no murmurs, rubs, or gallops. No clubbing, cyanosis, edema.  Radials/PT 2+ and equal bilaterally.  Respiratory:  Respirations regular and unlabored, clear to auscultation bilaterally. GI: Soft, nontender, nondistended. MS: No deformity or atrophy. Skin: Warm and dry, no rash. Neuro:  Strength and sensation are intact. Psych: Normal affect.  Assessment & Plan    HFrEF -Continue current medications including Eliquis 5 twice a day, aspirin 81 mg daily, Lipitor 80 mg daily, carvedilol increased to 6.25 mg twice a day, Plavix 75 g daily, Entresto 49 g twice a day, spironolactone 25 mg daily -Consider SGLT2 inhibitors once diabetes is better controlled.  Recent A1c 11.2 -Echocardiogram from 09/03/2021 with LVEF 60 to 65%, no significant valvular abnormality, no regional wall motion abnormalities. -Does not smoke and does not drink  2. Persistent atrial fibrillation -DCCV previously and needs another one -Today, rate 106 bpm -He is asymptomatic -Will obtain follow-up with EP -increase coreg to 6.25 BID -Ablation vs. Another DCCV  3. Essential HTN -since 62 years old -Well-controlled today -Continue current medication regimen  4. Insulin-dependent type 2 DM -Working with PCP to get better control of his sugars -A1C 11.2 as of 08/30/21   5. Hypercholesterolemia -Last lipid panel with LDL 142, HDL 36, triglycerides 191 -Triglycerides are better but LDL remains above goal -Probably needs to go back on his [place] at his next appointment.    Disposition: Follow up 2 months with Jenkins Rouge, MD or APP.  Signed, Elgie Collard, PA-C 07/01/2022, 4:36 PM [facility]

## 2022-07-01 NOTE — Patient Instructions (Addendum)
Medication Instructions:  Increase carvedilol (Coreg) to 6.25 mg twice a day *If you need a refill on your cardiac medications before your next appointment, please call your pharmacy*   Lab Work: BMET and CBC 1 week prior to cardioversion If you have labs (blood work) drawn today and your tests are completely normal, you will receive your results only by: [place] (if you have MyChart) OR A paper copy in the mail If you have any lab test that is abnormal or we need to change your treatment, we will call you to review the results.   Testing/Procedures: See instruction letter, we will call you next week with a date/time to have this done since they are closed now. Your physician has recommended that you have a Cardioversion (DCCV). Electrical Cardioversion uses a jolt of electricity to your heart either through paddles or wired patches attached to your chest. This is a controlled, usually prescheduled, procedure. Defibrillation is done under light anesthesia in the hospital, and you usually go home the day of the procedure. This is done to get your heart back into a normal rhythm. You are not awake for the procedure. Please see the instruction sheet given to you today.    Follow-Up: At [facility], you and your health needs are our priority.  As part of our continuing mission to provide you with exceptional heart care, we have created designated Provider Care Teams.  These Care Teams include your primary Cardiologist (physician) and Advanced Practice Providers (APPs -  Physician Assistants and Nurse Practitioners) who all work together to provide you with the care you need, when you need it.  We recommend signing up for the patient portal called "MyChart".  Sign up information is provided on this After Visit Summary.  MyChart is used to connect with patients for Virtual Visits (Telemedicine).  Patients are able to view lab/test results, encounter notes, upcoming appointments, etc.   Non-urgent messages can be sent to your provider as well.   To learn more about what you can do with MyChart, go to [email or web address].    Your next appointment:   2 month(s)  Provider:   Jenkins Rouge, MD  or Nicholes Rough, PA-C

## 2022-07-04 ENCOUNTER — Telehealth: Payer: Self-pay | Admitting: *Deleted

## 2022-07-04 NOTE — Telephone Encounter (Signed)
Left msg for pt to let him know that I had scheduled his cardioversion. Letter was provided to him at his office visit with [facility] on Friday 07/01/22. Need to schedule him for labs, orders are already in.

## 2022-07-06 NOTE — Telephone Encounter (Signed)
Left another msg for pt.

## 2022-07-09 ENCOUNTER — Emergency Department (HOSPITAL_COMMUNITY): Payer: Medicaid Other

## 2022-07-09 ENCOUNTER — Other Ambulatory Visit: Payer: Self-pay

## 2022-07-09 ENCOUNTER — Emergency Department (HOSPITAL_COMMUNITY)
Admission: EM | Admit: 2022-07-09 | Discharge: 2022-07-09 | Disposition: A | Discharge status: Home / Self Care | Payer: Medicaid Other | Attending: Emergency Medicine | Admitting: Emergency Medicine

## 2022-07-09 DIAGNOSIS — Z1152 Encounter for screening for COVID-19: Secondary | ICD-10-CM | POA: Insufficient documentation

## 2022-07-09 DIAGNOSIS — Z79899 Other long term (current) drug therapy: Secondary | ICD-10-CM | POA: Insufficient documentation

## 2022-07-09 DIAGNOSIS — R519 Headache, unspecified: Secondary | ICD-10-CM | POA: Insufficient documentation

## 2022-07-09 DIAGNOSIS — R509 Fever, unspecified: Secondary | ICD-10-CM | POA: Diagnosis present

## 2022-07-09 DIAGNOSIS — E871 Hypo-osmolality and hyponatremia: Secondary | ICD-10-CM | POA: Diagnosis not present

## 2022-07-09 DIAGNOSIS — I10 Essential (primary) hypertension: Secondary | ICD-10-CM | POA: Diagnosis not present

## 2022-07-09 DIAGNOSIS — Z794 Long term (current) use of insulin: Secondary | ICD-10-CM | POA: Insufficient documentation

## 2022-07-09 DIAGNOSIS — Z7982 Long term (current) use of aspirin: Secondary | ICD-10-CM | POA: Diagnosis not present

## 2022-07-09 DIAGNOSIS — B349 Viral infection, unspecified: Secondary | ICD-10-CM | POA: Diagnosis not present

## 2022-07-09 DIAGNOSIS — Z7901 Long term (current) use of anticoagulants: Secondary | ICD-10-CM | POA: Insufficient documentation

## 2022-07-09 DIAGNOSIS — E119 Type 2 diabetes mellitus without complications: Secondary | ICD-10-CM | POA: Insufficient documentation

## 2022-07-09 LAB — CBC WITH DIFFERENTIAL/PLATELET
Abs Immature Granulocytes: 0.08 10*3/uL — ABNORMAL HIGH (ref 0.00–0.07)
Basophils Absolute: 0.1 10*3/uL (ref 0.0–0.1)
Basophils Relative: 0 %
Eosinophils Absolute: 0 10*3/uL (ref 0.0–0.5)
Eosinophils Relative: 0 %
HCT: 40 % (ref 39.0–52.0)
Hemoglobin: 13.9 g/dL (ref 13.0–17.0)
Immature Granulocytes: 1 %
Lymphocytes Relative: 3 %
Lymphs Abs: 0.4 10*3/uL — ABNORMAL LOW (ref 0.7–4.0)
MCH: 32 pg (ref 26.0–34.0)
MCHC: 34.8 g/dL (ref 30.0–36.0)
MCV: 92.2 fL (ref 80.0–100.0)
Monocytes Absolute: 1.1 10*3/uL — ABNORMAL HIGH (ref 0.1–1.0)
Monocytes Relative: 8 %
Neutro Abs: 12.2 10*3/uL — ABNORMAL HIGH (ref 1.7–7.7)
Neutrophils Relative %: 88 %
Platelets: 180 10*3/uL (ref 150–400)
RBC: 4.34 MIL/uL (ref 4.22–5.81)
RDW: 13.7 % (ref 11.5–15.5)
WBC: 13.9 10*3/uL — ABNORMAL HIGH (ref 4.0–10.5)
nRBC: 0 % (ref 0.0–0.2)

## 2022-07-09 LAB — COMPREHENSIVE METABOLIC PANEL
ALT: 38 U/L (ref 0–44)
AST: 44 U/L — ABNORMAL HIGH (ref 15–41)
Albumin: 3.1 g/dL — ABNORMAL LOW (ref 3.5–5.0)
Alkaline Phosphatase: 98 U/L (ref 38–126)
Anion gap: 12 (ref 5–15)
BUN: 33 mg/dL — ABNORMAL HIGH (ref 6–20)
CO2: 20 mmol/L — ABNORMAL LOW (ref 22–32)
Calcium: 8.8 mg/dL — ABNORMAL LOW (ref 8.9–10.3)
Chloride: 93 mmol/L — ABNORMAL LOW (ref 98–111)
Creatinine, Ser: 1.38 mg/dL — ABNORMAL HIGH (ref 0.61–1.24)
GFR, Estimated: 59 mL/min — ABNORMAL LOW (ref >60)
Glucose, Bld: 170 mg/dL — ABNORMAL HIGH (ref 70–99)
Potassium: 3.9 mmol/L (ref 3.5–5.1)
Sodium: 125 mmol/L — ABNORMAL LOW (ref 135–145)
Total Bilirubin: 1.6 mg/dL — ABNORMAL HIGH (ref 0.3–1.2)
Total Protein: 7.1 g/dL (ref 6.5–8.1)

## 2022-07-09 LAB — URINALYSIS, ROUTINE W REFLEX MICROSCOPIC
Bacteria, UA: NONE SEEN
Bilirubin Urine: NEGATIVE
Glucose, UA: >=500 mg/dL — AB
Hgb urine dipstick: NEGATIVE
Ketones, ur: NEGATIVE mg/dL
Leukocytes,Ua: NEGATIVE
Nitrite: NEGATIVE
Protein, ur: 100 mg/dL — AB
Specific Gravity, Urine: 1.014 (ref 1.005–1.030)
pH: 5 (ref 5.0–8.0)

## 2022-07-09 LAB — RESP PANEL BY RT-PCR (RSV, FLU A&B, COVID)  RVPGX2
Influenza A by PCR: NEGATIVE
Influenza B by PCR: NEGATIVE
Resp Syncytial Virus by PCR: NEGATIVE
SARS Coronavirus 2 by RT PCR: NEGATIVE

## 2022-07-09 LAB — LIPASE, BLOOD: Lipase: 24 U/L (ref 11–51)

## 2022-07-09 LAB — TROPONIN I (HIGH SENSITIVITY)
Troponin I (High Sensitivity): 23 ng/L — ABNORMAL HIGH (ref <18)
Troponin I (High Sensitivity): 23 ng/L — ABNORMAL HIGH (ref <18)

## 2022-07-09 LAB — LACTIC ACID, PLASMA: Lactic Acid, Venous: 1.5 mmol/L (ref 0.5–1.9)

## 2022-07-09 MED ORDER — MORPHINE SULFATE (PF) 4 MG/ML IV SOLN
4.0000 mg | Freq: Once | INTRAVENOUS | Status: AC
  Administered 2022-07-09: 4 mg via INTRAVENOUS
  Filled 2022-07-09: qty 1

## 2022-07-09 MED ORDER — LACTATED RINGERS IV BOLUS
500.0000 mL | Freq: Once | INTRAVENOUS | Status: AC
  Administered 2022-07-09: 500 mL via INTRAVENOUS

## 2022-07-09 MED ORDER — ONDANSETRON HCL 4 MG/2ML IJ SOLN
4.0000 mg | Freq: Once | INTRAMUSCULAR | Status: AC
  Administered 2022-07-09: 4 mg via INTRAVENOUS
  Filled 2022-07-09: qty 2

## 2022-07-09 NOTE — ED Triage Notes (Signed)
The pt thinks he has the flu. States he has been waking up in sweats during the night. Pt c/o cough, fever, headache, and back pain x3 days. States his gait has been unsteady. Denies any vomiting, but has been nauseated. The patient states he has been taking Aleve for his symptoms.

## 2022-07-09 NOTE — Discharge Instructions (Signed)
You were seen for your viral infection in the emergency department.   At home, please take tylenol as needed for any fevers and chills you have. Please limit the amount of water you drink to 1-1.5 liters of fluid per day because your sodium was low.    Follow-up with your primary doctor in 2-3 days regarding your visit.  Please talk to them about having your sodium rechecked.  Return immediately to the emergency department if you experience any of the following: Muscle cramps, palpitations, seizures, high fevers, or any other concerning symptoms.    Thank you for visiting our Emergency Department. It was a pleasure taking care of you today.

## 2022-07-09 NOTE — ED Provider Notes (Signed)
[place] EMERGENCY DEPARTMENT AT [facility] Provider Note   CSN: [id number] Arrival date & time: 07/09/22  1648     History  Chief Complaint  Patient presents with   Back Pain   Cough   Fever    David [name] is a 62 y.o. male.  62 year old male with a history of heart failure, atrial fibrillation on Eliquis and Plavix, HTN, DM who presents emergency department with flulike symptoms.  Says that for the past 3 days has been feeling generally ill.  Reports that he has had a fever up to 104 last night and has been taking Advil for his temperature.  Says that he has had a mild cough.  Has had nausea without vomiting.  Also reports mild shortness of breath mild headache.  Presented to the emergency department today due to epigastric chest discomfort that is sharp.  Says it is worsened with position.  Reports being compliant with his Eliquis and has no history of DVT or PE.        Home Medications Prior to Admission medications   Medication Sig Start Date End Date Taking? Authorizing Provider  apixaban (ELIQUIS) 5 MG TABS tablet Take 1 tablet (5 mg total) by mouth 2 (two) times daily. 05/10/21   Tereso Newcomer T, PA-C  aspirin EC 81 MG tablet Take 1 tablet (81 mg total) by mouth daily. Swallow whole. 09/07/21   Burnadette Pop, MD  atorvastatin (LIPITOR) 80 MG tablet Take 1 tablet (80 mg total) by mouth daily. 05/17/22   [name], MD  busPIRone (BUSPAR) 10 MG tablet Take 1 tablet (10 mg total) by mouth 2 (two) times daily. Patient taking differently: Take 10 mg by mouth as needed. 12/16/21   Anders Simmonds, PA-C  carvedilol (COREG) 6.25 MG tablet Take 1 tablet (6.25 mg total) by mouth 2 (two) times daily. 07/01/22   Sharlene Dory, PA-C  clopidogrel (PLAVIX) 75 MG tablet Take 1 tablet (75 mg total) by mouth daily with breakfast. 04/13/22   [name], MD  digoxin (LANOXIN) 0.125 MG tablet Take 1 tablet (125 mcg total) by mouth once daily. 06/03/22   Wendall Stade, MD   famotidine (PEPCID) 20 MG tablet Take 1 tablet (20 mg total) by mouth 2 times daily. 03/24/22     fluticasone (FLONASE) 50 MCG/ACT nasal spray Place 2 sprays into both nostrils daily. 02/01/22   [name], MD  furosemide (LASIX) 40 MG tablet Take 1 tablet (40 mg total) by mouth daily. 05/17/22   [name], MD  HYDROcodone-acetaminophen (NORCO/VICODIN) 5-325 MG tablet Take 1 tablet by mouth every 4 (four) hours as needed for up to 7 days for Moderate pain (4-6) or Severe pain (7-10). 03/24/22     Insulin Glargine (BASAGLAR KWIKPEN) 100 UNIT/ML Inject 40 Units into the skin daily. 04/13/22   [name], MD  insulin lispro (HUMALOG KWIKPEN) 100 UNIT/ML KwikPen Inject 15 Units into the skin 3 (three) times daily. 04/13/22   [name], MD  sacubitril-valsartan (ENTRESTO) 49-51 MG Take 1 tablet by mouth 2 (two) times daily. Needs to see cardiology for refills 01/04/22   Claiborne Rigg, NP  spironolactone (ALDACTONE) 25 MG tablet Take 1 tablet (25 mg total) by mouth daily. 12/16/21 07/20/22  Anders Simmonds, PA-C      Allergies    Patient has no known allergies.    Review of Systems   Review of Systems  Physical Exam Updated Vital Signs BP 111/85 (BP Location: Right Arm)  Pulse 89   Temp 99 F (37.2 C) (Oral)   Resp 19   Ht 6' (1.829 m)   Wt 81.6 kg   SpO2 93%   BMI 24.41 kg/m  Physical Exam Vitals and nursing note reviewed.  Constitutional:      General: He is not in acute distress.    Appearance: He is well-developed.  HENT:     [name]: Normocephalic and atraumatic.     Right Ear: External ear normal.     Left Ear: External ear normal.     Nose: Nose normal.  Eyes:     Extraocular Movements: Extraocular movements intact.     Conjunctiva/sclera: Conjunctivae normal.     Pupils: Pupils are equal, round, and reactive to light.  Cardiovascular:     Rate and Rhythm: Tachycardia present. Rhythm irregular.     Heart sounds: Normal heart sounds.  Pulmonary:      Effort: Pulmonary effort is normal. No respiratory distress.     Breath sounds: Normal breath sounds.  Abdominal:     General: There is no distension.     Palpations: Abdomen is soft. There is no mass.     Tenderness: There is no abdominal tenderness. There is no guarding.  Musculoskeletal:     Cervical back: Normal range of motion and neck supple.  Skin:    General: Skin is warm and dry.  Neurological:     Mental Status: He is alert. Mental status is at baseline.  Psychiatric:        Mood and Affect: Mood normal.        Behavior: Behavior normal.     ED Results / Procedures / Treatments   Labs (all labs ordered are listed, but only abnormal results are displayed) Labs Reviewed  COMPREHENSIVE METABOLIC PANEL - Abnormal; Notable for the following components:      Result Value   Sodium 125 (*)    Chloride 93 (*)    CO2 20 (*)    Glucose, Bld 170 (*)    BUN 33 (*)    Creatinine, Ser 1.38 (*)    Calcium 8.8 (*)    Albumin 3.1 (*)    AST 44 (*)    Total Bilirubin 1.6 (*)    GFR, Estimated 59 (*)    All other components within normal limits  CBC WITH DIFFERENTIAL/PLATELET - Abnormal; Notable for the following components:   WBC 13.9 (*)    Neutro Abs 12.2 (*)    Lymphs Abs 0.4 (*)    Monocytes Absolute 1.1 (*)    Abs Immature Granulocytes 0.08 (*)    All other components within normal limits  URINALYSIS, ROUTINE W REFLEX MICROSCOPIC - Abnormal; Notable for the following components:   Glucose, UA >=500 (*)    Protein, ur 100 (*)    All other components within normal limits  TROPONIN I (HIGH SENSITIVITY) - Abnormal; Notable for the following components:   Troponin I (High Sensitivity) 23 (*)    All other components within normal limits  TROPONIN I (HIGH SENSITIVITY) - Abnormal; Notable for the following components:   Troponin I (High Sensitivity) 23 (*)    All other components within normal limits  RESP PANEL BY RT-PCR (RSV, FLU A&B, COVID)  RVPGX2  LACTIC ACID, PLASMA   LIPASE, BLOOD    EKG None  Radiology DG Chest 2 View  Result Date: 07/09/2022 CLINICAL DATA:  Cough and fever for 3 days.  Night sweats. EXAM: CHEST - 2 VIEW COMPARISON:  09/05/2021 FINDINGS: The heart size and mediastinal contours are within normal limits. Stable tortuosity of thoracic aorta noted. Both lungs are clear. The visualized skeletal structures are unremarkable. IMPRESSION: No active cardiopulmonary disease. Electronically Signed   By: Danae OrleansJohn A Stahl M.D.   On: 07/09/2022 17:44    Procedures Procedures   Medications Ordered in ED Medications  morphine (PF) 4 MG/ML injection 4 mg (4 mg Intravenous Given 07/09/22 1851)  ondansetron (ZOFRAN) injection 4 mg (4 mg Intravenous Given 07/09/22 1849)  lactated ringers bolus 500 mL (0 mLs Intravenous Stopped 07/09/22 2317)    ED Course/ Medical Decision Making/ A&P Clinical Course as of 07/09/22 2318  Sat Jul 09, 2022  1946 Sodium(!): 125 Decreased from baseline [RP]    Clinical Course User Index [RP] Rondel BatonPaterson, Fin Hupp C, MD                            Medical Decision Making Amount and/or Complexity of Data Reviewed Labs: ordered. Decision-making details documented in ED Course. Radiology: ordered.  Risk Prescription drug management.   David KeyMark [name] is a 62 y.o. male with comorbidities that complicate the patient evaluation including heart failure, atrial fibrillation on Eliquis and Plavix, HTN, DM who presents emergency department with flulike symptoms.   Initial Ddx:  COVID/flu, viral illness, pneumonia, MI, pericarditis, PE  MDM:  The patient symptoms suspect that he has a viral illness.  Appears that most of his symptoms are respiratory so we will check for COVID and flu as well as pneumonia.  Will send off EKG and troponins with his epigastric pain feel MI is less likely.  Could potentially have pericarditis given the positional nature and sharp nature of his pain as well.  Considered PE but due to the fact that he is  anticoagulated and also on antiplatelets not experiencing significant shortness of breath feels less likely.  Also with his fever feels there is more likely cause.  Plan:  Labs COVID/flu Chest x-ray Lipase Troponin EKG Urinalysis  ED Summary/Re-evaluation:  Patient underwent the above workup.  Had mildly elevated but flat troponins at 23.  Does appear to have CKD so suspect that this is likely because of this.  Did not have any dynamic changes on his EKG.  Labs did show that his sodium was 125.  I do not have a recent sodium to go by the suspect this may be due to his heart failure.  Not having any severe symptoms of hyponatremia at this time so we will have him follow-up with his primary doctor for repeat labs.  No readily apparent infection was identified on his workup today.  Do suspect that he probably has a viral infection.  Will have him follow-up with his primary doctor in several days.  This patient presents to the ED for concern of complaints listed in HPI, this involves an extensive number of treatment options, and is a complaint that carries with it a high risk of complications and morbidity. Disposition including potential need for admission considered.   Dispo: DC Home. Return precautions discussed including, but not limited to, those listed in the AVS. Allowed pt time to ask questions which were answered fully prior to dc.  Records reviewed Outpatient Clinic Notes The following labs were independently interpreted: Chemistry and show CKD and hyponatremia I independently reviewed the following imaging with scope of interpretation limited to determining acute life threatening conditions related to emergency care: Chest x-ray and agree with the radiologist  interpretation with the following exceptions: none I personally reviewed and interpreted cardiac monitoring: normal sinus rhythm  I personally reviewed and interpreted the pt's EKG: see above for interpretation  I have reviewed  the patients home medications and made adjustments as needed  Final Clinical Impression(s) / ED Diagnoses Final diagnoses:  Hyponatremia  Viral infection    Rx / DC Orders ED Discharge Orders     None         Rondel Baton, MD 07/09/22 2318

## 2022-07-12 ENCOUNTER — Emergency Department (HOSPITAL_COMMUNITY): Payer: Medicaid Other

## 2022-07-12 ENCOUNTER — Other Ambulatory Visit: Payer: Self-pay

## 2022-07-12 ENCOUNTER — Inpatient Hospital Stay (HOSPITAL_COMMUNITY)
Admission: EM | Admit: 2022-07-12 | Discharge: 2022-08-19 | DRG: 853 | Disposition: A | Discharge status: Home Health | Payer: Medicaid Other | Attending: Internal Medicine | Admitting: Internal Medicine

## 2022-07-12 ENCOUNTER — Encounter (HOSPITAL_COMMUNITY): Payer: Self-pay | Admitting: Radiology

## 2022-07-12 DIAGNOSIS — M546 Pain in thoracic spine: Secondary | ICD-10-CM | POA: Diagnosis not present

## 2022-07-12 DIAGNOSIS — Z7901 Long term (current) use of anticoagulants: Secondary | ICD-10-CM

## 2022-07-12 DIAGNOSIS — G8929 Other chronic pain: Secondary | ICD-10-CM | POA: Diagnosis present

## 2022-07-12 DIAGNOSIS — D631 Anemia in chronic kidney disease: Secondary | ICD-10-CM | POA: Diagnosis present

## 2022-07-12 DIAGNOSIS — L089 Local infection of the skin and subcutaneous tissue, unspecified: Secondary | ICD-10-CM | POA: Diagnosis not present

## 2022-07-12 DIAGNOSIS — K2980 Duodenitis without bleeding: Secondary | ICD-10-CM | POA: Diagnosis present

## 2022-07-12 DIAGNOSIS — N179 Acute kidney failure, unspecified: Secondary | ICD-10-CM

## 2022-07-12 DIAGNOSIS — A429 Actinomycosis, unspecified: Secondary | ICD-10-CM | POA: Diagnosis not present

## 2022-07-12 DIAGNOSIS — Z7189 Other specified counseling: Secondary | ICD-10-CM | POA: Diagnosis not present

## 2022-07-12 DIAGNOSIS — I739 Peripheral vascular disease, unspecified: Secondary | ICD-10-CM | POA: Diagnosis not present

## 2022-07-12 DIAGNOSIS — M60009 Infective myositis, unspecified site: Secondary | ICD-10-CM | POA: Diagnosis not present

## 2022-07-12 DIAGNOSIS — I429 Cardiomyopathy, unspecified: Secondary | ICD-10-CM | POA: Diagnosis present

## 2022-07-12 DIAGNOSIS — E872 Acidosis, unspecified: Secondary | ICD-10-CM | POA: Diagnosis present

## 2022-07-12 DIAGNOSIS — E1122 Type 2 diabetes mellitus with diabetic chronic kidney disease: Secondary | ICD-10-CM | POA: Diagnosis present

## 2022-07-12 DIAGNOSIS — Z91199 Patient's noncompliance with other medical treatment and regimen due to unspecified reason: Secondary | ICD-10-CM

## 2022-07-12 DIAGNOSIS — M4644 Discitis, unspecified, thoracic region: Secondary | ICD-10-CM

## 2022-07-12 DIAGNOSIS — E119 Type 2 diabetes mellitus without complications: Secondary | ICD-10-CM

## 2022-07-12 DIAGNOSIS — K5900 Constipation, unspecified: Secondary | ICD-10-CM | POA: Diagnosis not present

## 2022-07-12 DIAGNOSIS — E1149 Type 2 diabetes mellitus with other diabetic neurological complication: Secondary | ICD-10-CM | POA: Diagnosis not present

## 2022-07-12 DIAGNOSIS — J40 Bronchitis, not specified as acute or chronic: Secondary | ICD-10-CM | POA: Diagnosis present

## 2022-07-12 DIAGNOSIS — M86171 Other acute osteomyelitis, right ankle and foot: Secondary | ICD-10-CM | POA: Diagnosis present

## 2022-07-12 DIAGNOSIS — E43 Unspecified severe protein-calorie malnutrition: Secondary | ICD-10-CM | POA: Diagnosis present

## 2022-07-12 DIAGNOSIS — A4102 Sepsis due to Methicillin resistant Staphylococcus aureus: Principal | ICD-10-CM | POA: Diagnosis present

## 2022-07-12 DIAGNOSIS — M47814 Spondylosis without myelopathy or radiculopathy, thoracic region: Secondary | ICD-10-CM | POA: Diagnosis present

## 2022-07-12 DIAGNOSIS — I110 Hypertensive heart disease with heart failure: Secondary | ICD-10-CM | POA: Diagnosis not present

## 2022-07-12 DIAGNOSIS — E7800 Pure hypercholesterolemia, unspecified: Secondary | ICD-10-CM | POA: Diagnosis not present

## 2022-07-12 DIAGNOSIS — Z6824 Body mass index (BMI) 24.0-24.9, adult: Secondary | ICD-10-CM

## 2022-07-12 DIAGNOSIS — M4604 Spinal enthesopathy, thoracic region: Secondary | ICD-10-CM | POA: Diagnosis not present

## 2022-07-12 DIAGNOSIS — M4624 Osteomyelitis of vertebra, thoracic region: Secondary | ICD-10-CM | POA: Diagnosis present

## 2022-07-12 DIAGNOSIS — I4819 Other persistent atrial fibrillation: Secondary | ICD-10-CM | POA: Diagnosis present

## 2022-07-12 DIAGNOSIS — I5043 Acute on chronic combined systolic (congestive) and diastolic (congestive) heart failure: Secondary | ICD-10-CM | POA: Diagnosis present

## 2022-07-12 DIAGNOSIS — I5020 Unspecified systolic (congestive) heart failure: Secondary | ICD-10-CM | POA: Diagnosis present

## 2022-07-12 DIAGNOSIS — Z515 Encounter for palliative care: Secondary | ICD-10-CM | POA: Diagnosis not present

## 2022-07-12 DIAGNOSIS — Z794 Long term (current) use of insulin: Secondary | ICD-10-CM

## 2022-07-12 DIAGNOSIS — F4323 Adjustment disorder with mixed anxiety and depressed mood: Secondary | ICD-10-CM | POA: Diagnosis present

## 2022-07-12 DIAGNOSIS — M86172 Other acute osteomyelitis, left ankle and foot: Secondary | ICD-10-CM | POA: Diagnosis not present

## 2022-07-12 DIAGNOSIS — I509 Heart failure, unspecified: Secondary | ICD-10-CM | POA: Diagnosis not present

## 2022-07-12 DIAGNOSIS — G4730 Sleep apnea, unspecified: Secondary | ICD-10-CM | POA: Diagnosis present

## 2022-07-12 DIAGNOSIS — N1831 Chronic kidney disease, stage 3a: Secondary | ICD-10-CM | POA: Diagnosis present

## 2022-07-12 DIAGNOSIS — M6008 Infective myositis, other site: Secondary | ICD-10-CM | POA: Diagnosis not present

## 2022-07-12 DIAGNOSIS — E1169 Type 2 diabetes mellitus with other specified complication: Secondary | ICD-10-CM | POA: Diagnosis present

## 2022-07-12 DIAGNOSIS — Z87891 Personal history of nicotine dependence: Secondary | ICD-10-CM | POA: Diagnosis not present

## 2022-07-12 DIAGNOSIS — Z66 Do not resuscitate: Secondary | ICD-10-CM | POA: Diagnosis present

## 2022-07-12 DIAGNOSIS — J869 Pyothorax without fistula: Secondary | ICD-10-CM | POA: Diagnosis present

## 2022-07-12 DIAGNOSIS — M549 Dorsalgia, unspecified: Secondary | ICD-10-CM | POA: Diagnosis not present

## 2022-07-12 DIAGNOSIS — I4821 Permanent atrial fibrillation: Secondary | ICD-10-CM | POA: Diagnosis present

## 2022-07-12 DIAGNOSIS — E1151 Type 2 diabetes mellitus with diabetic peripheral angiopathy without gangrene: Secondary | ICD-10-CM | POA: Diagnosis present

## 2022-07-12 DIAGNOSIS — R7881 Bacteremia: Secondary | ICD-10-CM | POA: Diagnosis not present

## 2022-07-12 DIAGNOSIS — E1165 Type 2 diabetes mellitus with hyperglycemia: Secondary | ICD-10-CM | POA: Diagnosis present

## 2022-07-12 DIAGNOSIS — L97519 Non-pressure chronic ulcer of other part of right foot with unspecified severity: Secondary | ICD-10-CM | POA: Diagnosis present

## 2022-07-12 DIAGNOSIS — L03115 Cellulitis of right lower limb: Secondary | ICD-10-CM | POA: Diagnosis present

## 2022-07-12 DIAGNOSIS — R443 Hallucinations, unspecified: Secondary | ICD-10-CM | POA: Diagnosis not present

## 2022-07-12 DIAGNOSIS — I4891 Unspecified atrial fibrillation: Secondary | ICD-10-CM | POA: Diagnosis not present

## 2022-07-12 DIAGNOSIS — R531 Weakness: Secondary | ICD-10-CM | POA: Diagnosis not present

## 2022-07-12 DIAGNOSIS — M869 Osteomyelitis, unspecified: Secondary | ICD-10-CM | POA: Diagnosis not present

## 2022-07-12 DIAGNOSIS — J9601 Acute respiratory failure with hypoxia: Secondary | ICD-10-CM | POA: Diagnosis not present

## 2022-07-12 DIAGNOSIS — Z79899 Other long term (current) drug therapy: Secondary | ICD-10-CM

## 2022-07-12 DIAGNOSIS — Z8614 Personal history of Methicillin resistant Staphylococcus aureus infection: Secondary | ICD-10-CM

## 2022-07-12 DIAGNOSIS — E860 Dehydration: Secondary | ICD-10-CM | POA: Diagnosis present

## 2022-07-12 DIAGNOSIS — B9562 Methicillin resistant Staphylococcus aureus infection as the cause of diseases classified elsewhere: Secondary | ICD-10-CM | POA: Insufficient documentation

## 2022-07-12 DIAGNOSIS — I10 Essential (primary) hypertension: Secondary | ICD-10-CM | POA: Diagnosis not present

## 2022-07-12 DIAGNOSIS — Z89431 Acquired absence of right foot: Secondary | ICD-10-CM

## 2022-07-12 DIAGNOSIS — R17 Unspecified jaundice: Secondary | ICD-10-CM | POA: Diagnosis present

## 2022-07-12 DIAGNOSIS — Z7902 Long term (current) use of antithrombotics/antiplatelets: Secondary | ICD-10-CM

## 2022-07-12 DIAGNOSIS — Z7982 Long term (current) use of aspirin: Secondary | ICD-10-CM

## 2022-07-12 DIAGNOSIS — E871 Hypo-osmolality and hyponatremia: Secondary | ICD-10-CM | POA: Diagnosis present

## 2022-07-12 DIAGNOSIS — I5033 Acute on chronic diastolic (congestive) heart failure: Secondary | ICD-10-CM | POA: Diagnosis present

## 2022-07-12 DIAGNOSIS — I70221 Atherosclerosis of native arteries of extremities with rest pain, right leg: Secondary | ICD-10-CM | POA: Diagnosis present

## 2022-07-12 DIAGNOSIS — F05 Delirium due to known physiological condition: Secondary | ICD-10-CM | POA: Diagnosis not present

## 2022-07-12 DIAGNOSIS — B370 Candidal stomatitis: Secondary | ICD-10-CM | POA: Diagnosis present

## 2022-07-12 DIAGNOSIS — M4804 Spinal stenosis, thoracic region: Secondary | ICD-10-CM | POA: Diagnosis present

## 2022-07-12 DIAGNOSIS — J9811 Atelectasis: Secondary | ICD-10-CM | POA: Insufficient documentation

## 2022-07-12 DIAGNOSIS — J189 Pneumonia, unspecified organism: Secondary | ICD-10-CM | POA: Diagnosis present

## 2022-07-12 DIAGNOSIS — I130 Hypertensive heart and chronic kidney disease with heart failure and stage 1 through stage 4 chronic kidney disease, or unspecified chronic kidney disease: Secondary | ICD-10-CM | POA: Diagnosis present

## 2022-07-12 DIAGNOSIS — M4640 Discitis, unspecified, site unspecified: Secondary | ICD-10-CM | POA: Diagnosis present

## 2022-07-12 DIAGNOSIS — Z1152 Encounter for screening for COVID-19: Secondary | ICD-10-CM | POA: Diagnosis not present

## 2022-07-12 DIAGNOSIS — M86071 Acute hematogenous osteomyelitis, right ankle and foot: Secondary | ICD-10-CM | POA: Diagnosis not present

## 2022-07-12 DIAGNOSIS — Z5986 Financial insecurity: Secondary | ICD-10-CM

## 2022-07-12 DIAGNOSIS — I70261 Atherosclerosis of native arteries of extremities with gangrene, right leg: Secondary | ICD-10-CM | POA: Diagnosis not present

## 2022-07-12 DIAGNOSIS — M4854XA Collapsed vertebra, not elsewhere classified, thoracic region, initial encounter for fracture: Secondary | ICD-10-CM | POA: Diagnosis present

## 2022-07-12 DIAGNOSIS — E8809 Other disorders of plasma-protein metabolism, not elsewhere classified: Secondary | ICD-10-CM | POA: Diagnosis present

## 2022-07-12 DIAGNOSIS — E11628 Type 2 diabetes mellitus with other skin complications: Secondary | ICD-10-CM | POA: Diagnosis present

## 2022-07-12 DIAGNOSIS — Z8249 Family history of ischemic heart disease and other diseases of the circulatory system: Secondary | ICD-10-CM

## 2022-07-12 DIAGNOSIS — A419 Sepsis, unspecified organism: Secondary | ICD-10-CM | POA: Diagnosis present

## 2022-07-12 DIAGNOSIS — R627 Adult failure to thrive: Secondary | ICD-10-CM | POA: Diagnosis present

## 2022-07-12 DIAGNOSIS — E11621 Type 2 diabetes mellitus with foot ulcer: Secondary | ICD-10-CM | POA: Diagnosis present

## 2022-07-12 DIAGNOSIS — E876 Hypokalemia: Secondary | ICD-10-CM | POA: Diagnosis present

## 2022-07-12 DIAGNOSIS — Z89421 Acquired absence of other right toe(s): Secondary | ICD-10-CM | POA: Diagnosis not present

## 2022-07-12 DIAGNOSIS — M86179 Other acute osteomyelitis, unspecified ankle and foot: Secondary | ICD-10-CM | POA: Diagnosis not present

## 2022-07-12 LAB — RESPIRATORY PANEL BY PCR

## 2022-07-12 LAB — TROPONIN I (HIGH SENSITIVITY)
Troponin I (High Sensitivity): 154 ng/L (ref <18)
Troponin I (High Sensitivity): 180 ng/L (ref <18)
Troponin I (High Sensitivity): 125 ng/L (ref <18)

## 2022-07-12 LAB — CBC WITH DIFFERENTIAL/PLATELET
Abs Immature Granulocytes: 0.2 10*3/uL — ABNORMAL HIGH (ref 0.00–0.07)
Basophils Absolute: 0.1 10*3/uL (ref 0.0–0.1)
Basophils Relative: 1 %
Eosinophils Absolute: 0 10*3/uL (ref 0.0–0.5)
Eosinophils Relative: 0 %
HCT: 44.9 % (ref 39.0–52.0)
Hemoglobin: 15.2 g/dL (ref 13.0–17.0)
Immature Granulocytes: 1 %
Lymphocytes Relative: 4 %
Lymphs Abs: 0.7 10*3/uL (ref 0.7–4.0)
MCH: 31.3 pg (ref 26.0–34.0)
MCHC: 33.9 g/dL (ref 30.0–36.0)
MCV: 92.4 fL (ref 80.0–100.0)
Monocytes Absolute: 1.3 10*3/uL — ABNORMAL HIGH (ref 0.1–1.0)
Monocytes Relative: 8 %
Neutro Abs: 14.7 10*3/uL — ABNORMAL HIGH (ref 1.7–7.7)
Neutrophils Relative %: 86 %
Platelets: 248 10*3/uL (ref 150–400)
RBC: 4.86 MIL/uL (ref 4.22–5.81)
RDW: 14.3 % (ref 11.5–15.5)
WBC: 17 10*3/uL — ABNORMAL HIGH (ref 4.0–10.5)
nRBC: 0 % (ref 0.0–0.2)

## 2022-07-12 LAB — BASIC METABOLIC PANEL
Anion gap: 9 (ref 5–15)
BUN: 34 mg/dL — ABNORMAL HIGH (ref 6–20)
CO2: 23 mmol/L (ref 22–32)
Calcium: 8.6 mg/dL — ABNORMAL LOW (ref 8.9–10.3)
Chloride: 94 mmol/L — ABNORMAL LOW (ref 98–111)
Creatinine, Ser: 1.27 mg/dL — ABNORMAL HIGH (ref 0.61–1.24)
GFR, Estimated: >60 mL/min (ref >60)
Glucose, Bld: 422 mg/dL — ABNORMAL HIGH (ref 70–99)
Potassium: 5 mmol/L (ref 3.5–5.1)
Sodium: 126 mmol/L — ABNORMAL LOW (ref 135–145)

## 2022-07-12 LAB — GLUCOSE, CAPILLARY
Glucose-Capillary: 407 mg/dL — ABNORMAL HIGH (ref 70–99)
Glucose-Capillary: 448 mg/dL — ABNORMAL HIGH (ref 70–99)

## 2022-07-12 LAB — COMPREHENSIVE METABOLIC PANEL
ALT: 52 U/L — ABNORMAL HIGH (ref 0–44)
AST: 46 U/L — ABNORMAL HIGH (ref 15–41)
Albumin: 3 g/dL — ABNORMAL LOW (ref 3.5–5.0)
Alkaline Phosphatase: 171 U/L — ABNORMAL HIGH (ref 38–126)
Anion gap: 12 (ref 5–15)
BUN: 35 mg/dL — ABNORMAL HIGH (ref 6–20)
CO2: 24 mmol/L (ref 22–32)
Calcium: 9.3 mg/dL (ref 8.9–10.3)
Chloride: 90 mmol/L — ABNORMAL LOW (ref 98–111)
Creatinine, Ser: 1.41 mg/dL — ABNORMAL HIGH (ref 0.61–1.24)
GFR, Estimated: 57 mL/min — ABNORMAL LOW (ref >60)
Glucose, Bld: 375 mg/dL — ABNORMAL HIGH (ref 70–99)
Potassium: 4.8 mmol/L (ref 3.5–5.1)
Sodium: 126 mmol/L — ABNORMAL LOW (ref 135–145)
Total Bilirubin: 1.4 mg/dL — ABNORMAL HIGH (ref 0.3–1.2)
Total Protein: 7.9 g/dL (ref 6.5–8.1)

## 2022-07-12 LAB — BRAIN NATRIURETIC PEPTIDE: B Natriuretic Peptide: 319.6 pg/mL — ABNORMAL HIGH (ref 0.0–100.0)

## 2022-07-12 LAB — SARS CORONAVIRUS 2 BY RT PCR: SARS Coronavirus 2 by RT PCR: NEGATIVE

## 2022-07-12 LAB — LACTIC ACID, PLASMA
Lactic Acid, Venous: 3.8 mmol/L (ref 0.5–1.9)
Lactic Acid, Venous: 2.2 mmol/L (ref 0.5–1.9)

## 2022-07-12 LAB — STREP PNEUMONIAE URINARY ANTIGEN: Strep Pneumo Urinary Antigen: NEGATIVE

## 2022-07-12 LAB — DIGOXIN LEVEL: Digoxin Level: <0.2 ng/mL — ABNORMAL LOW (ref 0.8–2.0)

## 2022-07-12 MED ORDER — HYDROCODONE-ACETAMINOPHEN 5-325 MG PO TABS
1.0000 | ORAL_TABLET | ORAL | Status: DC | PRN
  Administered 2022-07-12 – 2022-07-14 (×6): 1 via ORAL
  Filled 2022-07-12 (×6): qty 1

## 2022-07-12 MED ORDER — INSULIN ASPART 100 UNIT/ML IJ SOLN
5.0000 [IU] | Freq: Once | INTRAMUSCULAR | Status: AC
  Administered 2022-07-12: 5 [IU] via SUBCUTANEOUS

## 2022-07-12 MED ORDER — SODIUM CHLORIDE 0.9 % IV SOLN
1.0000 g | Freq: Once | INTRAVENOUS | Status: AC
  Administered 2022-07-12: 1 g via INTRAVENOUS
  Filled 2022-07-12: qty 10

## 2022-07-12 MED ORDER — ACETAMINOPHEN 325 MG PO TABS
650.0000 mg | ORAL_TABLET | Freq: Once | ORAL | Status: AC
  Administered 2022-07-12: 650 mg via ORAL
  Filled 2022-07-12: qty 2

## 2022-07-12 MED ORDER — DILTIAZEM HCL 25 MG/5ML IV SOLN
10.0000 mg | Freq: Once | INTRAVENOUS | Status: AC
  Administered 2022-07-12: 10 mg via INTRAVENOUS
  Filled 2022-07-12: qty 5

## 2022-07-12 MED ORDER — SACUBITRIL-VALSARTAN 49-51 MG PO TABS
1.0000 | ORAL_TABLET | Freq: Two times a day (BID) | ORAL | Status: DC
  Administered 2022-07-12: 1 via ORAL
  Filled 2022-07-12 (×2): qty 1

## 2022-07-12 MED ORDER — IPRATROPIUM-ALBUTEROL 0.5-2.5 (3) MG/3ML IN SOLN
3.0000 mL | Freq: Four times a day (QID) | RESPIRATORY_TRACT | Status: DC
  Administered 2022-07-12 – 2022-07-14 (×7): 3 mL via RESPIRATORY_TRACT
  Filled 2022-07-12 (×8): qty 3

## 2022-07-12 MED ORDER — ACETAMINOPHEN 325 MG PO TABS
650.0000 mg | ORAL_TABLET | Freq: Four times a day (QID) | ORAL | Status: DC | PRN
  Administered 2022-07-13 – 2022-07-25 (×5): 650 mg via ORAL
  Filled 2022-07-12 (×6): qty 2

## 2022-07-12 MED ORDER — FAMOTIDINE 20 MG PO TABS
20.0000 mg | ORAL_TABLET | Freq: Two times a day (BID) | ORAL | Status: DC
  Administered 2022-07-12 – 2022-08-19 (×75): 20 mg via ORAL
  Filled 2022-07-12 (×76): qty 1

## 2022-07-12 MED ORDER — APIXABAN 5 MG PO TABS
5.0000 mg | ORAL_TABLET | Freq: Two times a day (BID) | ORAL | Status: DC
  Administered 2022-07-12 – 2022-07-25 (×27): 5 mg via ORAL
  Filled 2022-07-12 (×28): qty 1

## 2022-07-12 MED ORDER — ACETAMINOPHEN 650 MG RE SUPP: 650.0000 mg | Freq: Four times a day (QID) | RECTAL | Status: DC | PRN

## 2022-07-12 MED ORDER — ONDANSETRON HCL 4 MG/2ML IJ SOLN
INTRAMUSCULAR | Status: AC
  Administered 2022-07-12: 4 mg via INTRAVENOUS
  Filled 2022-07-12: qty 2

## 2022-07-12 MED ORDER — SODIUM CHLORIDE (PF) 0.9 % IJ SOLN
INTRAMUSCULAR | Status: AC
  Filled 2022-07-12: qty 50

## 2022-07-12 MED ORDER — CLOPIDOGREL BISULFATE 75 MG PO TABS
75.0000 mg | ORAL_TABLET | Freq: Every day | ORAL | Status: DC
  Administered 2022-07-13 – 2022-08-05 (×23): 75 mg via ORAL
  Filled 2022-07-12 (×24): qty 1

## 2022-07-12 MED ORDER — IPRATROPIUM-ALBUTEROL 0.5-2.5 (3) MG/3ML IN SOLN
3.0000 mL | Freq: Once | RESPIRATORY_TRACT | Status: AC
  Administered 2022-07-12: 3 mL via RESPIRATORY_TRACT
  Filled 2022-07-12: qty 3

## 2022-07-12 MED ORDER — IOHEXOL 350 MG/ML SOLN
75.0000 mL | Freq: Once | INTRAVENOUS | Status: AC | PRN
  Administered 2022-07-12: 100 mL via INTRAVENOUS

## 2022-07-12 MED ORDER — SPIRONOLACTONE 25 MG PO TABS
25.0000 mg | ORAL_TABLET | Freq: Every day | ORAL | Status: DC
  Administered 2022-07-12 – 2022-07-17 (×5): 25 mg via ORAL
  Filled 2022-07-12 (×5): qty 1

## 2022-07-12 MED ORDER — ONDANSETRON HCL 4 MG PO TABS
4.0000 mg | ORAL_TABLET | Freq: Four times a day (QID) | ORAL | Status: DC | PRN
  Administered 2022-07-12: 4 mg via ORAL
  Filled 2022-07-12: qty 1

## 2022-07-12 MED ORDER — INSULIN ASPART 100 UNIT/ML IJ SOLN
0.0000 [IU] | Freq: Three times a day (TID) | INTRAMUSCULAR | Status: DC
  Administered 2022-07-12 – 2022-07-13 (×4): 20 [IU] via SUBCUTANEOUS
  Administered 2022-07-14: 11 [IU] via SUBCUTANEOUS
  Administered 2022-07-14: 3 [IU] via SUBCUTANEOUS
  Administered 2022-07-14: 15 [IU] via SUBCUTANEOUS
  Administered 2022-07-15 (×2): 3 [IU] via SUBCUTANEOUS
  Administered 2022-07-16 – 2022-07-18 (×4): 4 [IU] via SUBCUTANEOUS
  Administered 2022-07-19 – 2022-07-20 (×5): 3 [IU] via SUBCUTANEOUS
  Administered 2022-07-21 (×2): 4 [IU] via SUBCUTANEOUS
  Administered 2022-07-22: 3 [IU] via SUBCUTANEOUS
  Administered 2022-07-22: 4 [IU] via SUBCUTANEOUS
  Administered 2022-07-22: 7 [IU] via SUBCUTANEOUS
  Administered 2022-07-23: 11 [IU] via SUBCUTANEOUS
  Administered 2022-07-23 – 2022-07-24 (×2): 7 [IU] via SUBCUTANEOUS
  Administered 2022-07-24 (×2): 4 [IU] via SUBCUTANEOUS
  Administered 2022-07-25: 7 [IU] via SUBCUTANEOUS
  Administered 2022-07-25: 4 [IU] via SUBCUTANEOUS
  Administered 2022-07-26 (×2): 7 [IU] via SUBCUTANEOUS
  Administered 2022-07-26: 3 [IU] via SUBCUTANEOUS
  Administered 2022-07-27: 4 [IU] via SUBCUTANEOUS
  Administered 2022-07-27: 7 [IU] via SUBCUTANEOUS
  Administered 2022-07-28 – 2022-07-29 (×3): 4 [IU] via SUBCUTANEOUS
  Administered 2022-07-29 – 2022-07-31 (×7): 7 [IU] via SUBCUTANEOUS
  Administered 2022-07-31: 11 [IU] via SUBCUTANEOUS
  Administered 2022-08-01: 4 [IU] via SUBCUTANEOUS
  Administered 2022-08-01: 7 [IU] via SUBCUTANEOUS
  Administered 2022-08-01: 3 [IU] via SUBCUTANEOUS
  Administered 2022-08-02: 7 [IU] via SUBCUTANEOUS
  Administered 2022-08-02: 11 [IU] via SUBCUTANEOUS
  Administered 2022-08-03: 7 [IU] via SUBCUTANEOUS
  Administered 2022-08-03: 4 [IU] via SUBCUTANEOUS
  Administered 2022-08-03: 15 [IU] via SUBCUTANEOUS
  Administered 2022-08-04: 11 [IU] via SUBCUTANEOUS
  Administered 2022-08-04 (×2): 7 [IU] via SUBCUTANEOUS
  Administered 2022-08-05: 11 [IU] via SUBCUTANEOUS
  Administered 2022-08-05: 7 [IU] via SUBCUTANEOUS
  Administered 2022-08-05 – 2022-08-06 (×2): 4 [IU] via SUBCUTANEOUS
  Administered 2022-08-06: 3 [IU] via SUBCUTANEOUS
  Administered 2022-08-06: 4 [IU] via SUBCUTANEOUS
  Administered 2022-08-07: 7 [IU] via SUBCUTANEOUS
  Administered 2022-08-07 (×2): 4 [IU] via SUBCUTANEOUS
  Administered 2022-08-08: 3 [IU] via SUBCUTANEOUS
  Administered 2022-08-08 – 2022-08-09 (×3): 4 [IU] via SUBCUTANEOUS
  Administered 2022-08-10: 3 [IU] via SUBCUTANEOUS
  Administered 2022-08-10: 7 [IU] via SUBCUTANEOUS
  Administered 2022-08-11 (×2): 4 [IU] via SUBCUTANEOUS
  Administered 2022-08-12: 7 [IU] via SUBCUTANEOUS
  Administered 2022-08-12: 3 [IU] via SUBCUTANEOUS
  Administered 2022-08-12: 7 [IU] via SUBCUTANEOUS
  Administered 2022-08-14: 4 [IU] via SUBCUTANEOUS
  Administered 2022-08-14 – 2022-08-17 (×5): 3 [IU] via SUBCUTANEOUS
  Administered 2022-08-17 – 2022-08-19 (×4): 4 [IU] via SUBCUTANEOUS
  Administered 2022-08-19: 3 [IU] via SUBCUTANEOUS
  Filled 2022-07-12: qty 0.2

## 2022-07-12 MED ORDER — LACTATED RINGERS IV BOLUS
1000.0000 mL | Freq: Once | INTRAVENOUS | Status: AC
  Administered 2022-07-12: 1000 mL via INTRAVENOUS

## 2022-07-12 MED ORDER — ATORVASTATIN CALCIUM 40 MG PO TABS
80.0000 mg | ORAL_TABLET | Freq: Every day | ORAL | Status: DC
  Administered 2022-07-12 – 2022-07-25 (×14): 80 mg via ORAL
  Filled 2022-07-12 (×14): qty 2

## 2022-07-12 MED ORDER — BUDESONIDE 0.5 MG/2ML IN SUSP
0.5000 mg | Freq: Two times a day (BID) | RESPIRATORY_TRACT | Status: DC
  Administered 2022-07-12 – 2022-08-12 (×47): 0.5 mg via RESPIRATORY_TRACT
  Filled 2022-07-12 (×66): qty 2

## 2022-07-12 MED ORDER — FUROSEMIDE 40 MG PO TABS: 40.0000 mg | ORAL_TABLET | Freq: Every day | ORAL | Status: DC

## 2022-07-12 MED ORDER — ONDANSETRON HCL 4 MG/2ML IJ SOLN: 4.0000 mg | Freq: Once | INTRAMUSCULAR | Status: AC

## 2022-07-12 MED ORDER — METHYLPREDNISOLONE SODIUM SUCC 125 MG IJ SOLR
125.0000 mg | Freq: Once | INTRAMUSCULAR | Status: AC
  Administered 2022-07-12: 125 mg via INTRAVENOUS
  Filled 2022-07-12: qty 2

## 2022-07-12 MED ORDER — SODIUM CHLORIDE 0.9 % IV SOLN
500.0000 mg | INTRAVENOUS | Status: DC
  Filled 2022-07-12: qty 5

## 2022-07-12 MED ORDER — ONDANSETRON HCL 4 MG/2ML IJ SOLN
4.0000 mg | Freq: Four times a day (QID) | INTRAMUSCULAR | Status: DC | PRN
  Administered 2022-07-14: 4 mg via INTRAVENOUS
  Filled 2022-07-12: qty 2

## 2022-07-12 MED ORDER — SODIUM CHLORIDE 0.9 % IV SOLN
500.0000 mg | Freq: Once | INTRAVENOUS | Status: AC
  Administered 2022-07-12: 500 mg via INTRAVENOUS
  Filled 2022-07-12: qty 5

## 2022-07-12 MED ORDER — DIGOXIN 125 MCG PO TABS
125.0000 ug | ORAL_TABLET | Freq: Every day | ORAL | Status: DC
  Administered 2022-07-12 – 2022-08-19 (×40): 125 ug via ORAL
  Filled 2022-07-12 (×40): qty 1

## 2022-07-12 MED ORDER — BUSPIRONE HCL 5 MG PO TABS
10.0000 mg | ORAL_TABLET | Freq: Two times a day (BID) | ORAL | Status: DC
  Administered 2022-07-12 – 2022-07-30 (×37): 10 mg via ORAL
  Filled 2022-07-12 (×3): qty 1
  Filled 2022-07-12 (×2): qty 2
  Filled 2022-07-12 (×5): qty 1
  Filled 2022-07-12: qty 2
  Filled 2022-07-12 (×5): qty 1
  Filled 2022-07-12: qty 2
  Filled 2022-07-12 (×6): qty 1
  Filled 2022-07-12 (×2): qty 2
  Filled 2022-07-12: qty 1
  Filled 2022-07-12 (×4): qty 2
  Filled 2022-07-12 (×6): qty 1
  Filled 2022-07-12: qty 2
  Filled 2022-07-12 (×3): qty 1

## 2022-07-12 MED ORDER — INSULIN GLARGINE-YFGN 100 UNIT/ML ~~LOC~~ SOLN
40.0000 [IU] | Freq: Every day | SUBCUTANEOUS | Status: DC
  Administered 2022-07-12 – 2022-07-14 (×3): 40 [IU] via SUBCUTANEOUS
  Filled 2022-07-12 (×4): qty 0.4

## 2022-07-12 MED ORDER — CARVEDILOL 3.125 MG PO TABS
6.2500 mg | ORAL_TABLET | Freq: Two times a day (BID) | ORAL | Status: DC
  Administered 2022-07-12: 6.25 mg via ORAL
  Filled 2022-07-12: qty 2

## 2022-07-12 MED ORDER — SODIUM CHLORIDE 0.9 % IV SOLN: 1.0000 g | INTRAVENOUS | Status: DC

## 2022-07-12 NOTE — ED Triage Notes (Signed)
C/o sob x 1week.  Hx CHF and fib on eliquis and Plavix.  Seen on 07/09/22 with flu like symptoms.  87% on RA, 2lpm [place] added.  Pt also tachycardia and tachypneic.

## 2022-07-12 NOTE — ED Provider Notes (Signed)
[place] [facility] Provider [facility]   CSN: [id number] Arrival date & time: 07/12/22  [id number]     History  Chief Complaint  Patient presents with   Shortness of Breath    David [name] is a 62 y.o. male.  Level 5 caveat for respiratory distress.  Patient here with difficulty breathing, cough and congestion for the past 6 days.  Was seen in the ED 3 days ago and diagnosed with a viral infection.  Reports his breathing is worse.  Reports shaking chills at home with fever up to 100.5 yesterday.  Cough productive of clear mucus.  Having mid back pain for the past 3 to 4 days that is constant but denies chest pain.  Found to be hypoxic on arrival to 87%.  Tachycardic and tachypneic.  Denies any history of COPD otr asthma but does have a history of CHF.  States compliance with his medications including diuretics and Eliquis for atrial fibrillation.  No leg pain or leg swelling.  No abdominal pain, nausea or vomiting.  No travel or sick contacts.  States he is always in atrial fibrillation.  The history is provided by the patient. The history is limited by the condition of the patient.  Shortness of Breath Associated symptoms: cough and fever   Associated symptoms: no abdominal pain, no chest pain, no headaches, no rash, no sore throat and no vomiting        Home Medications Prior to Admission medications   Medication Sig Start Date End Date Taking? Authorizing Provider  apixaban (ELIQUIS) 5 MG TABS tablet Take 1 tablet (5 mg total) by mouth 2 (two) times daily. 05/10/21   Tereso NewcomerWeaver, Scott T, PA-C  aspirin EC 81 MG tablet Take 1 tablet (81 mg total) by mouth daily. Swallow whole. 09/07/21   Burnadette PopAdhikari, Amrit, MD  atorvastatin (LIPITOR) 80 MG tablet Take 1 tablet (80 mg total) by mouth daily. 05/17/22   Hoy RegisterNewlin, Enobong, MD  busPIRone (BUSPAR) 10 MG tablet Take 1 tablet (10 mg total) by mouth 2 (two) times daily. Patient taking differently: Take 10 mg by mouth as needed.  12/16/21   Anders SimmondsMcClung, Angela M, PA-C  carvedilol (COREG) 6.25 MG tablet Take 1 tablet (6.25 mg total) by mouth 2 (two) times daily. 07/01/22   Sharlene Doryonte, Tessa N, PA-C  clopidogrel (PLAVIX) 75 MG tablet Take 1 tablet (75 mg total) by mouth daily with breakfast. 04/13/22   Hoy RegisterNewlin, Enobong, MD  digoxin (LANOXIN) 0.125 MG tablet Take 1 tablet (125 mcg total) by mouth once daily. 06/03/22   Wendall StadeNishan, Peter C, MD  famotidine (PEPCID) 20 MG tablet Take 1 tablet (20 mg total) by mouth 2 times daily. 03/24/22     fluticasone (FLONASE) 50 MCG/ACT nasal spray Place 2 sprays into both nostrils daily. 02/01/22   Hoy RegisterNewlin, Enobong, MD  furosemide (LASIX) 40 MG tablet Take 1 tablet (40 mg total) by mouth daily. 05/17/22   Hoy RegisterNewlin, Enobong, MD  HYDROcodone-acetaminophen (NORCO/VICODIN) 5-325 MG tablet Take 1 tablet by mouth every 4 (four) hours as needed for up to 7 days for Moderate pain (4-6) or Severe pain (7-10). 03/24/22     Insulin Glargine (BASAGLAR KWIKPEN) 100 UNIT/ML Inject 40 Units into the skin daily. 04/13/22   Hoy RegisterNewlin, Enobong, MD  insulin lispro (HUMALOG KWIKPEN) 100 UNIT/ML KwikPen Inject 15 Units into the skin 3 (three) times daily. 04/13/22   Hoy RegisterNewlin, Enobong, MD  sacubitril-valsartan (ENTRESTO) 49-51 MG Take 1 tablet by mouth 2 (two) times daily. Needs to  see cardiology for refills 01/04/22   Claiborne Rigg, NP  spironolactone (ALDACTONE) 25 MG tablet Take 1 tablet (25 mg total) by mouth daily. 12/16/21 07/20/22  Anders Simmonds, PA-C      Allergies    Patient has no known allergies.    Review of Systems   Review of Systems  Constitutional:  Positive for chills, fatigue and fever. Negative for activity change and appetite change.  HENT:  Positive for congestion and rhinorrhea. Negative for sore throat.   Respiratory:  Positive for cough, chest tightness and shortness of breath.   Cardiovascular:  Positive for palpitations. Negative for chest pain and leg swelling.  Gastrointestinal:  Negative for abdominal  pain, nausea and vomiting.  Genitourinary:  Negative for dysuria and hematuria.  Musculoskeletal:  Positive for arthralgias and myalgias.  Skin:  Negative for rash.  Neurological:  Positive for weakness. Negative for headaches.    all other systems are negative except as noted in the HPI and PMH.   Physical Exam Updated Vital Signs BP (!) 171/128 (BP Location: Left Arm)   Pulse (!) 104   Temp 98.7 F (37.1 C) (Oral)   Resp (!) 22   Wt 81 kg   SpO2 92%   BMI 24.22 kg/m  Physical Exam Vitals and nursing note reviewed.  Constitutional:      General: He is in acute distress.     Appearance: He is well-developed. He is ill-appearing.     Comments: Respiratory distress, speaking in 2-3 word phrases  HENT:     Head: Normocephalic and atraumatic.     Mouth/Throat:     Pharynx: No oropharyngeal exudate.  Eyes:     Conjunctiva/sclera: Conjunctivae normal.     Pupils: Pupils are equal, round, and reactive to light.  Neck:     Comments: No meningismus. Cardiovascular:     Rate and Rhythm: Tachycardia present. Rhythm irregular.     Heart sounds: Normal heart sounds. No murmur heard. Pulmonary:     Effort: Respiratory distress present.     Breath sounds: Normal breath sounds.     Comments: Diminished bilaterally.  Chest:     Chest wall: No tenderness.  Abdominal:     Palpations: Abdomen is soft.     Tenderness: There is no abdominal tenderness. There is no guarding or rebound.  Musculoskeletal:        General: No tenderness. Normal range of motion.     Cervical back: Normal range of motion and neck supple.     Right lower leg: No edema.     Left lower leg: No edema.  Skin:    General: Skin is warm.     Capillary Refill: Capillary refill takes less than 2 seconds.  Neurological:     General: No focal deficit present.     Mental Status: He is alert and oriented to person, place, and time. Mental status is at baseline.     Cranial Nerves: No cranial nerve deficit.     Motor:  No abnormal muscle tone.     Coordination: Coordination normal.     Comments: No ataxia on finger to nose bilaterally. No pronator drift. 5/5 strength throughout. CN 2-12 intact.Equal grip strength. Sensation intact.   Psychiatric:        Behavior: Behavior normal.     ED Results / Procedures / Treatments   Labs (all labs ordered are listed, but only abnormal results are displayed) Labs Reviewed  LACTIC ACID, PLASMA - Abnormal; Notable for  the following components:      Result Value   Lactic Acid, Venous 3.8 (*)    All other components within normal limits  LACTIC ACID, PLASMA - Abnormal; Notable for the following components:   Lactic Acid, Venous 2.2 (*)    All other components within normal limits  CBC WITH DIFFERENTIAL/PLATELET - Abnormal; Notable for the following components:   WBC 17.0 (*)    Neutro Abs 14.7 (*)    Monocytes Absolute 1.3 (*)    Abs Immature Granulocytes 0.20 (*)    All other components within normal limits  COMPREHENSIVE METABOLIC PANEL - Abnormal; Notable for the following components:   Sodium 126 (*)    Chloride 90 (*)    Glucose, Bld 375 (*)    BUN 35 (*)    Creatinine, Ser 1.41 (*)    Albumin 3.0 (*)    AST 46 (*)    ALT 52 (*)    Alkaline Phosphatase 171 (*)    Total Bilirubin 1.4 (*)    GFR, Estimated 57 (*)    All other components within normal limits  BRAIN NATRIURETIC PEPTIDE - Abnormal; Notable for the following components:   B Natriuretic Peptide 319.6 (*)    All other components within normal limits  DIGOXIN LEVEL - Abnormal; Notable for the following components:   Digoxin Level <0.2 (*)    All other components within normal limits  TROPONIN I (HIGH SENSITIVITY) - Abnormal; Notable for the following components:   Troponin I (High Sensitivity) 154 (*)    All other components within normal limits  TROPONIN I (HIGH SENSITIVITY) - Abnormal; Notable for the following components:   Troponin I (High Sensitivity) 180 (*)    All other  components within normal limits  SARS CORONAVIRUS 2 BY RT PCR  CULTURE, BLOOD (ROUTINE X 2)  CULTURE, BLOOD (ROUTINE X 2)    EKG EKG Interpretation  Date/Time:  Tuesday July 12 2022 08:18:18 EDT Ventricular Rate:  122 PR Interval:    QRS Duration: 99 QT Interval:  305 QTC Calculation: 435 R Axis:   97 Text Interpretation: Right and left arm electrode reversal, interpretation assumes no reversal Atrial fibrillation Ventricular premature complex Right axis deviation Repol abnrm suggests ischemia, inferior leads Nonspecific T wave abnormality Confirmed by Glynn Octave [id number]) on 07/12/2022 8:29:07 AM  Radiology CT ABDOMEN PELVIS W CONTRAST  Result Date: 07/12/2022 CLINICAL DATA:  Abdominal pain EXAM: CT ABDOMEN AND PELVIS WITH CONTRAST TECHNIQUE: Multidetector CT imaging of the abdomen and pelvis was performed using the standard protocol following bolus administration of intravenous contrast. RADIATION DOSE REDUCTION: This exam was performed according to the departmental dose-optimization program which includes automated exposure control, adjustment of the mA and/or kV according to patient size and/or use of iterative reconstruction technique. CONTRAST:  OMNIPAQUE IOHEXOL 350 MG/ML SOLN COMPARISON:  08/29/2021 FINDINGS: Lower chest: Please see separately reported examination of the abdomen and pelvis. Hepatobiliary: No solid liver abnormality is seen. No gallstones, gallbladder wall thickening, or biliary dilatation. Pancreas: Unremarkable. No pancreatic ductal dilatation or surrounding inflammatory changes. Spleen: Normal in size without significant abnormality. Adrenals/Urinary Tract: Adrenal glands are unremarkable. Small nonobstructive left renal calculi (series 12, image 42). No right-sided calculi, ureteral calculi, or hydronephrosis. Bladder is unremarkable. Stomach/Bowel: Stomach is within normal limits. Circumferential wall thickening and mucosal hyperenhancement of the descending  duodenum (series 12, image 32). Appendix is not clearly visualized. Moderate burden of stool and stool balls throughout the colon Vascular/Lymphatic: Aortic atherosclerosis. No enlarged abdominal or  pelvic lymph nodes. Reproductive: No mass or other significant abnormality. Other: No abdominal wall hernia or abnormality. No ascites. Musculoskeletal: No acute or significant osseous findings. IMPRESSION: 1. Circumferential wall thickening and mucosal hyperenhancement of the descending duodenum, suggesting nonspecific infectious or inflammatory duodenitis. 2. Nonobstructive left nephrolithiasis. No right-sided calculi, ureteral calculi, or hydronephrosis. Aortic Atherosclerosis (ICD10-I70.0). Electronically Signed   By: Jearld Lesch M.D.   On: 07/12/2022 12:19   CT Angio Chest PE W and/or Wo Contrast  Result Date: 07/12/2022 CLINICAL DATA:  PE suspected, shortness of breath, history of CHF, flu-like symptoms EXAM: CT ANGIOGRAPHY CHEST WITH CONTRAST TECHNIQUE: Multidetector CT imaging of the chest was performed using the standard protocol during bolus administration of intravenous contrast. Multiplanar CT image reconstructions and MIPs were obtained to evaluate the vascular anatomy. RADIATION DOSE REDUCTION: This exam was performed according to the departmental dose-optimization program which includes automated exposure control, adjustment of the mA and/or kV according to patient size and/or use of iterative reconstruction technique. CONTRAST:  OMNIPAQUE IOHEXOL 350 MG/ML SOLN COMPARISON:  08/29/2021 FINDINGS: Cardiovascular: Satisfactory opacification of the pulmonary arteries to the segmental level. No evidence of pulmonary embolism. Normal heart size. Three-vessel coronary artery calcifications. No pericardial effusion. Scattered aortic atherosclerosis. Mediastinum/Nodes: No enlarged mediastinal, hilar, or axillary lymph nodes. Small hiatal hernia. Thyroid gland, trachea, and esophagus demonstrate no  significant findings. Lungs/Pleura: Dependent bibasilar atelectasis or scarring. Small pleural effusions. Upper Abdomen: Please see separately reported examination of the abdomen. Musculoskeletal: No chest wall abnormality. No acute osseous findings. Review of the MIP images confirms the above findings. IMPRESSION: 1. Negative examination for pulmonary embolism. 2. Small pleural effusions. 3. Dependent bibasilar atelectasis or scarring. 4. Coronary artery disease. Aortic Atherosclerosis (ICD10-I70.0). Electronically Signed   By: Jearld Lesch M.D.   On: 07/12/2022 12:11   DG Chest Portable 1 View  Result Date: 07/12/2022 CLINICAL DATA:  Shortness of breath. EXAM: PORTABLE CHEST 1 VIEW COMPARISON:  Chest x-ray April 6, 24. FINDINGS: Low lung volumes. No consolidation. No visible pleural effusions or pneumothorax. Borderline enlargement the cardiac silhouette, likely accentuated by technique. No acute osseous abnormality. IMPRESSION: Low lung volumes without evidence of acute cardiopulmonary disease. Electronically Signed   By: Feliberto Harts M.D.   On: 07/12/2022 09:56    Procedures .Critical Care  Performed by: Glynn Octave, MD Authorized by: Glynn Octave, MD   Critical care provider statement:    Critical care time (minutes):  45   Critical care time was exclusive of:  Teaching time   Critical care was necessary to treat or prevent imminent or life-threatening deterioration of the following conditions:  Sepsis   Critical care was time spent personally by me on the following activities:  Development of treatment plan with patient or surrogate, discussions with consultants, evaluation of patient's response to treatment, examination of patient, ordering and review of laboratory studies, ordering and review of radiographic studies, ordering and performing treatments and interventions, pulse oximetry, re-evaluation of patient's condition, review of old charts, blood draw for specimens and  obtaining history from patient or surrogate   I assumed direction of critical care for this patient from another provider in my specialty: no     Care discussed with: admitting provider       Medications Ordered in ED Medications  ipratropium-albuterol (DUONEB) 0.5-2.5 (3) MG/3ML nebulizer solution 3 mL (has no administration in time range)  methylPREDNISolone sodium succinate (SOLU-MEDROL) 125 mg/2 mL injection 125 mg (has no administration in time range)    ED  Course/ Medical Decision Making/ A&P                             Medical Decision Making Amount and/or Complexity of Data Reviewed Labs: ordered. Decision-making details documented in ED Course. Radiology: ordered and independent interpretation performed. Decision-making details documented in ED Course. ECG/medicine tests: ordered and independent interpretation performed. Decision-making details documented in ED Course.  Risk OTC drugs. Prescription drug management. Decision regarding hospitalization.   6 days of difficulty breathing with cough, congestion, mid back pain but no chest pain.  Hypoxic on arrival, tachypneic and tachycardic.  Atrial fibrillation with RVR.  Hypoxic on room air.  Chest x-ray from 2 days ago reviewed and shows no infiltrate or edema.  Patient started on broad-spectrum antibiotics after cultures were obtained.  Concern for possible pneumonia given leukocytosis and elevated lactate.  Infectious workup pursued.  Patient tachycardic, tachypneic, hypoxic.  Leukocytosis noted.  Elevated lactate 3.8.  Last ejection fraction was normal.  Will hydrate.  Chest x-ray does not show obvious infiltrate but does show low lung volumes.  Results reviewed and interpreted by me. Tachypnea and tachycardia are improving.  Patient did develop several episodes of vomiting since arrival to the ED but denies abdominal pain denies posttussive emesis.  Labs with similar hyponatremia of 126 as well as elevated troponin of  151.  No evidence of acute ST elevation.  Lactate improving with IV fluids.  Heart rate improving but remains in rapid A-fib.  CT scan is obtained to rule out occult pneumonia versus pulmonary embolism but he states compliance with his Eliquis.  Did develop some episodes of vomiting since arrival to the ED.  CT negative for PE.  Results reviewed interpreted by me.  No obvious pneumonia but does show small pleural effusions. CT abdomen pelvis shows no acute bowel obstruction but does show thickening of the duodenum consistent with duodenitis.  Work of breathing has improved.  Suspect likely bronchitis causing patient's presentation and new hypoxia.  Cannot rule out pneumonia not yet seen on imaging.  He is given IV fluids and broad-spectrum antibiotics.  Will need admission for new onset oxygen requirement.  Patient discussed with Dr. Robb Matar.  Patient given dose of Cardizem to help control his atrial fibrillation rate.       Final Clinical Impression(s) / ED Diagnoses Final diagnoses:  Acute respiratory failure with hypoxia  Bronchitis  Atrial fibrillation with RVR    Rx / DC Orders ED Discharge Orders     None         Demetrias Goodbar, Jeannett Senior, MD 07/12/22 1826

## 2022-07-12 NOTE — ED Notes (Signed)
Pt actively vomiting.

## 2022-07-12 NOTE — H&P (Signed)
History and Physical    Patient: David [name] RUE:[id number] DOB: 07/01/1960 DOA: 07/12/2022 DOS: the patient was seen and examined on 07/12/2022 PCP: Hoy RegisterNewlin, Enobong, MD  Patient coming from: Home  Chief Complaint:  Chief Complaint  Patient presents with   Shortness of Breath   HPI: David [name] is a 62 y.o. 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 His appetite has been decreased. He had a fever at home of 105 F. He was having significant chills and rigors.  His rigors were so intense that produced the above-mentioned back pain.  He had an episode of nausea and emesis this morning. No travel history or sick contacts. He denied fever, chills, rhinorrhea, sore throat, wheezing or hemoptysis. No chest pain, palpitations, diaphoresis, PND, orthopnea or pitting edema of the lower extremities.  No abdominal pain, diarrhea, constipation, melena or hematochezia.  No flank pain, dysuria, frequency or hematuria.  No polyuria, polydipsia, polyphagia or blurred vision.   ED course: Initial vital signs were temperature 98.7 F, pulse 7016, respirations 38, BP 171/120 20 and O2 sat 87% on room air.  The patient received acetaminophen 650 mg p.o., 2000 mL LR bolus, ondansetron 4 mg IVP, diltiazem 10 mg IVP, a DuoNeb, ceftriaxone and azithromycin IVPB.  Lab work: CBC showed a white count of 17.0, hemoglobin 15.2 g/dL platelets 147248.  Lactic acid is 3.8 and 2.2 mmol/L.  Troponin 154 then 180 mg/L.  BNP 319.6 pg/mL.  Sodium 126, potassium 4.8, chloride 90 and CO2 24 mmol/L.  Total bilirubin 1.4, glucose 375, BUN 35  and creatinine 1.41 mg/dL.  Total protein 7.9 albumin 3.0 g deciliter.  AST 46, ALT 52 and alkaline phosphatase 171 units/L.  Coronavirus PCR was negative.  Imaging: Portable 1 view chest radiograph with low lung volumes without evidence of acute cardiopulmonary disease.  CTA chest with no PE.  Small pleural effusions.  Dependent bibasilar atelectasis or scaring.  Coronary artery disease.  CT abdomen with circumferential wall thickening and mucosal hyperenhancement of the descending duodenum, suggesting nonspecific infectious or inflammatory duodenitis.  Nonobstructive left nephrolithiasis.   Review of Systems: As mentioned in the history of present illness. All other systems reviewed and are negative. Past Medical History:  Diagnosis Date   Cardiomyopathy    a. EF 45% in 2019.   CHF (congestive heart failure)    Chronic anticoagulation 09/30/2017   Diabetes mellitus type 2 in nonobese    Does not have health insurance    Essential hypertension 09/26/2017   Financial difficulties    H/O noncompliance with medical treatment, presenting hazards to health    Non-insulin treated type 2 diabetes mellitus 09/26/2017   Persistent atrial fibrillation    Sleep apnea suspected 09/30/2017   Past Surgical History:  Procedure Laterality Date   ABDOMINAL AORTOGRAM W/LOWER EXTREMITY N/A 09/02/2021   Procedure: ABDOMINAL AORTOGRAM W/LOWER EXTREMITY;  Surgeon: Nada LibmanBrabham, Vance W, MD;  Location: [facility];  Service: Cardiovascular;  Laterality: N/A;   ABDOMINAL AORTOGRAM W/LOWER EXTREMITY N/A 12/28/2021   Procedure: ABDOMINAL AORTOGRAM W/LOWER EXTREMITY;  Surgeon: Nada LibmanBrabham, Vance W, MD;  Location: [facility];  Service: Cardiovascular;  Laterality: N/A;   AMPUTATION Right 09/04/2021   Procedure: 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;  Surgeon: Nadara Mustard, MD;  Location: [facility] OR;  Service: Orthopedics;  Laterality: Right;   APPENDECTOMY  1971   CARDIOVERSION N/A 09/28/2017   Procedure: CARDIOVERSION;   Surgeon: Thurmon Fair, MD;  Location: [facility] ENDOSCOPY;  Service: Cardiovascular;  Laterality: N/A;   PERIPHERAL VASCULAR BALLOON ANGIOPLASTY  09/02/2021   Procedure: PERIPHERAL VASCULAR BALLOON ANGIOPLASTY;  Surgeon: Nada Libman, MD;  Location: [facility];  Service: Cardiovascular;;   PERIPHERAL VASCULAR BALLOON ANGIOPLASTY  12/28/2021   Procedure: PERIPHERAL VASCULAR BALLOON ANGIOPLASTY;  Surgeon: Nada Libman, MD;  Location: [facility];  Service: Cardiovascular;;  Posterior Tibial PTA only   TEE WITHOUT CARDIOVERSION N/A 09/28/2017   Procedure: TRANSESOPHAGEAL ECHOCARDIOGRAM (TEE);  Surgeon: Thurmon Fair, MD;  Location: [facility] ENDOSCOPY;  Service: Cardiovascular;  Laterality: N/A;   TEE WITHOUT CARDIOVERSION N/A 09/03/2021   Procedure: TRANSESOPHAGEAL ECHOCARDIOGRAM (TEE);  Surgeon: Chilton Si, MD;  Location: [facility] ENDOSCOPY;  Service: Cardiovascular;  Laterality: N/A;   Social History:  reports that he quit smoking about 4 years ago. His smoking use included cigarettes. He has never used smokeless tobacco. He reports that he does not currently use alcohol after a past usage of about 3.0 standard drinks of alcohol per week. He reports that he does not use drugs.  No Known Allergies  Family History  Problem Relation Age of Onset   Hypertension Mother     Prior to Admission medications   Medication Sig Start Date End Date Taking? Authorizing Provider  apixaban (ELIQUIS) 5 MG TABS tablet Take 1 tablet (5 mg total) by mouth 2 (two) times daily. 05/10/21   Tereso Newcomer T, PA-C  aspirin EC 81 MG tablet Take 1 tablet (81 mg total) by mouth daily. Swallow whole. 09/07/21   Burnadette Pop, MD  atorvastatin (LIPITOR) 80 MG tablet Take 1 tablet (80 mg total) by mouth daily. 05/17/22   [name], MD  busPIRone (BUSPAR) 10 MG tablet Take 1 tablet (10 mg total) by mouth 2 (two) times daily. Patient taking differently: Take 10 mg by mouth as needed. 12/16/21   Anders Simmonds,  PA-C  carvedilol (COREG) 6.25 MG tablet Take 1 tablet (6.25 mg total) by mouth 2 (two) times daily. 07/01/22   Sharlene Dory, PA-C  clopidogrel (PLAVIX) 75 MG tablet Take 1 tablet (75 mg total) by mouth daily with breakfast. 04/13/22   [name], MD  digoxin (LANOXIN) 0.125 MG tablet Take 1 tablet (125 mcg total) by mouth once daily. 06/03/22   Wendall Stade, MD  famotidine (PEPCID) 20 MG tablet Take 1 tablet (20 mg total) by mouth 2 times daily. 03/24/22     fluticasone (FLONASE) 50 MCG/ACT nasal spray Place 2 sprays into both nostrils daily. 02/01/22   [name], MD  furosemide (LASIX) 40 MG tablet Take 1 tablet (40 mg total) by mouth daily. 05/17/22   [name], MD  HYDROcodone-acetaminophen (NORCO/VICODIN) 5-325 MG tablet Take 1 tablet by mouth every 4 (four) hours as needed for up to 7 days for Moderate pain (4-6) or Severe pain (7-10). 03/24/22     Insulin Glargine (BASAGLAR KWIKPEN) 100 UNIT/ML Inject 40 Units into the skin daily. 04/13/22   [name], MD  insulin lispro (HUMALOG KWIKPEN) 100 UNIT/ML KwikPen Inject 15 Units into the skin 3 (three) times daily. 04/13/22   [name], MD  sacubitril-valsartan (ENTRESTO) 49-51 MG Take 1 tablet by mouth 2 (two) times daily. Needs to see cardiology for refills 01/04/22  Claiborne Rigg, NP  spironolactone (ALDACTONE) 25 MG tablet Take 1 tablet (25 mg total) by mouth daily. 12/16/21 07/20/22  Anders Simmonds, PA-C    Physical Exam: Vitals:   07/12/22 0930 07/12/22 1020 07/12/22 1215 07/12/22 1219  BP: (!) 150/91 126/82 (!) 150/102   Pulse: (!) 113 (!) 102 (!) 115   Resp:   (!) 27   Temp:   98.2 F (36.8 C)   TempSrc:   Oral   SpO2: 97% 96% 96%   Weight:    81 kg  Height:    6' (1.829 m)   Physical Exam Vitals and nursing note reviewed.  Constitutional:      Appearance: He is well-developed.  HENT:     Head: Normocephalic.     Nose: No rhinorrhea.     Mouth/Throat:     Mouth: Mucous membranes are moist.   Eyes:     General: No scleral icterus.    Pupils: Pupils are equal, round, and reactive to light.  Neck:     Vascular: No JVD.  Cardiovascular:     Rate and Rhythm: Normal rate and regular rhythm.  Pulmonary:     Breath sounds: Wheezing present.  Abdominal:     General: Bowel sounds are normal. There is no distension.     Palpations: Abdomen is soft.     Tenderness: There is no abdominal tenderness. There is no guarding.  Musculoskeletal:     Cervical back: Neck supple.     Right lower leg: No edema.     Left lower leg: No edema.  Skin:    General: Skin is warm and dry.  Neurological:     General: No focal deficit present.     Mental Status: He is alert and oriented to person, place, and time.  Psychiatric:        Mood and Affect: Mood normal.     Data Reviewed:  There are no new results to review at this time. 08/31/2021 echo IMPRESSIONS:    1. Left ventricular ejection fraction, by estimation, is 55 to 60%. The  left ventricle has normal function. The left ventricle has no regional  wall motion abnormalities. Left ventricular diastolic parameters are  indeterminate.   2. Right ventricular systolic function is low normal. The right  ventricular size is normal.   3. Left atrial size was mild to moderately dilated.   4. The mitral valve is normal in structure. Mild mitral valve  regurgitation.   5. The aortic valve is tricuspid. Aortic valve regurgitation is not  visualized. Aortic valve sclerosis/calcification is present, without any  evidence of aortic stenosis.   6. Aortic dilatation noted. There is mild dilatation of the aortic root,  measuring 40 mm. There is mild dilatation of the ascending aorta,  measuring 38 mm.   EKG: Vent. rate 122 BPM PR interval * ms QRS duration 99 ms QT/QTcB 305/435 ms P-R-T axes * 97 -67 Right and left arm electrode reversal, interpretation assumes no reversal Atrial fibrillation Ventricular premature complex Right axis  deviation Repol abnrm suggests ischemia, inferior leads  Assessment and Plan: Principal Problem:   Acute respiratory failure with hypoxia In the setting of:   Sepsis due to pneumonia Admit to PCU/inpatient. Continue supplemental oxygen. No maintenance fluids due to CHF. Scheduled and as needed bronchodilators. Continue ceftriaxone 1 g IVPB daily. Continue azithromycin 500 mg IVPB daily. Check strep pneumoniae urinary antigen. Check respiratory pathogens by PCR. Check sputum Gram stain, culture and sensitivity. Follow-up  blood culture and sensitivity. Follow-up CBC and chemistry in the morning.  Active Problems:   Hyponatremia In the setting of GI losses/furosemide use. Hold furosemide for now. Follow sodium level in a.m.    Hypomagnesemia Magnesium has been replaced. Follow level as needed.    Essential hypertension Continue carvedilol twice daily. Also on Entresto and spironolactone. Holding furosemide as above.    Insulin dependent type 2 diabetes mellitus Carbohydrate modified diet. Continue Basaglar 40 units SQ daily. CBG monitoring with RI SS.    Atrial fibrillation with RVR Rate is now controlled now. CHA2DS2-VASc Score of at least 4. Continue apixaban 5 mg p.o. twice daily. Continue carvedilol 6.25 mg p.o. twice daily. Keep electrolytes optimized.    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) Continue Entresto spironolactone and beta-blocker. Furosemide has been held due to volume depletion and hyponatremia.    Pure hypercholesterolemia Continue atorvastatin 80 mg p.o. daily.    Sleep apnea suspected Advised to pursue outpatient evaluation.    Advance Care Planning:   Code Status: Full Code   Consults:   Family Communication:   Severity of Illness: The appropriate patient status for this patient is INPATIENT. Inpatient status is judged to be reasonable and necessary in order to provide the required intensity of service to  ensure the patient's safety. The patient's presenting symptoms, physical exam findings, and initial radiographic and laboratory data in the context of their chronic comorbidities is felt to place them at high risk for further clinical deterioration. Furthermore, it is not anticipated that the patient will be medically stable for discharge from the hospital within 2 midnights of admission.   * I certify that at the point of admission it is my clinical judgment that the patient will require inpatient hospital care spanning beyond 2 midnights from the point of admission due to high intensity of service, high risk for further deterioration and high frequency of surveillance required.*  Author: Bobette Mo, MD 07/12/2022 1:03 PM  For on call review [email or web address].   This document was prepared using Dragon voice recognition software and may contain some unintended transcription errors.

## 2022-07-12 NOTE — Consult Note (Signed)
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  Wound type: neuropathic, full thickness. Pressure Injury POA: N/A Measurement:To be obtained by Bedside RN and documented on Nursing Flow Sheet with applicatoni of next dressing change. Dressing procedure/placement/frequency: I have provided Nursing with conservative care guidance for this lesions using a daily NS cleanse, pat dry and topical care using antimicrobial nonadherent gauze (xeroform) topped with dry gauze and secured with Kerlix roll gauze/paper tape. Heels are to be floated and a sacral foam placed for PI prevention.  WOC nursing team will not follow, but will remain available to this patient, the nursing and medical teams.  Please re-consult if needed.  Thank you for inviting [place] to participate in this patient's Plan of Care.  Ladona Mow, MSN, RN, CNS, GNP, Leda Min, [employer], [employer] phone:  [phone]

## 2022-07-12 NOTE — ED Notes (Signed)
ED Provider at bedside. 

## 2022-07-12 NOTE — ED Notes (Signed)
X-ray at bedside

## 2022-07-13 ENCOUNTER — Inpatient Hospital Stay (HOSPITAL_COMMUNITY): Payer: Medicaid Other

## 2022-07-13 DIAGNOSIS — R7881 Bacteremia: Secondary | ICD-10-CM

## 2022-07-13 LAB — ECHOCARDIOGRAM COMPLETE
Area-P 1/2: 4.06 cm2
Calc EF: 71.7 %
Height: 72 in
S' Lateral: 2.6 cm
Single Plane A2C EF: 71.9 %
Single Plane A4C EF: 71.9 %
Weight: 2857.16 oz

## 2022-07-13 LAB — BLOOD CULTURE ID PANEL (REFLEXED) - BCID2

## 2022-07-13 LAB — CBC
HCT: 36.7 % — ABNORMAL LOW (ref 39.0–52.0)
Hemoglobin: 12.3 g/dL — ABNORMAL LOW (ref 13.0–17.0)
MCH: 31.6 pg (ref 26.0–34.0)
MCHC: 33.5 g/dL (ref 30.0–36.0)
MCV: 94.3 fL (ref 80.0–100.0)
Platelets: 205 10*3/uL (ref 150–400)
RBC: 3.89 MIL/uL — ABNORMAL LOW (ref 4.22–5.81)
RDW: 14.6 % (ref 11.5–15.5)
WBC: 19.1 10*3/uL — ABNORMAL HIGH (ref 4.0–10.5)
nRBC: 0 % (ref 0.0–0.2)

## 2022-07-13 LAB — COMPREHENSIVE METABOLIC PANEL
ALT: 33 U/L (ref 0–44)
AST: 20 U/L (ref 15–41)
Albumin: 2.3 g/dL — ABNORMAL LOW (ref 3.5–5.0)
Alkaline Phosphatase: 117 U/L (ref 38–126)
Anion gap: 9 (ref 5–15)
BUN: 35 mg/dL — ABNORMAL HIGH (ref 6–20)
CO2: 22 mmol/L (ref 22–32)
Calcium: 8.3 mg/dL — ABNORMAL LOW (ref 8.9–10.3)
Chloride: 93 mmol/L — ABNORMAL LOW (ref 98–111)
Creatinine, Ser: 1.32 mg/dL — ABNORMAL HIGH (ref 0.61–1.24)
GFR, Estimated: >60 mL/min (ref >60)
Glucose, Bld: 437 mg/dL — ABNORMAL HIGH (ref 70–99)
Potassium: 4.8 mmol/L (ref 3.5–5.1)
Sodium: 124 mmol/L — ABNORMAL LOW (ref 135–145)
Total Bilirubin: 1 mg/dL (ref 0.3–1.2)
Total Protein: 6.1 g/dL — ABNORMAL LOW (ref 6.5–8.1)

## 2022-07-13 LAB — HEMOGLOBIN A1C
Hgb A1c MFr Bld: 9.1 % — ABNORMAL HIGH (ref 4.8–5.6)
Mean Plasma Glucose: 214.47 mg/dL

## 2022-07-13 LAB — GLUCOSE, CAPILLARY
Glucose-Capillary: 425 mg/dL — ABNORMAL HIGH (ref 70–99)
Glucose-Capillary: 396 mg/dL — ABNORMAL HIGH (ref 70–99)
Glucose-Capillary: 383 mg/dL — ABNORMAL HIGH (ref 70–99)
Glucose-Capillary: 385 mg/dL — ABNORMAL HIGH (ref 70–99)
Glucose-Capillary: 281 mg/dL — ABNORMAL HIGH (ref 70–99)

## 2022-07-13 MED ORDER — VANCOMYCIN HCL 1750 MG/350ML IV SOLN
1750.0000 mg | INTRAVENOUS | Status: DC
  Administered 2022-07-14 – 2022-07-15 (×2): 1750 mg via INTRAVENOUS
  Filled 2022-07-13 (×2): qty 350

## 2022-07-13 MED ORDER — INSULIN ASPART 100 UNIT/ML IJ SOLN
10.0000 [IU] | Freq: Once | INTRAMUSCULAR | Status: AC
  Administered 2022-07-13: 10 [IU] via SUBCUTANEOUS

## 2022-07-13 MED ORDER — CARVEDILOL 3.125 MG PO TABS
3.1250 mg | ORAL_TABLET | Freq: Two times a day (BID) | ORAL | Status: DC
  Administered 2022-07-13 – 2022-08-13 (×62): 3.125 mg via ORAL
  Filled 2022-07-13 (×62): qty 1

## 2022-07-13 MED ORDER — ORAL CARE MOUTH RINSE: 15.0000 mL | OROMUCOSAL | Status: DC | PRN

## 2022-07-13 MED ORDER — GADOBUTROL 1 MMOL/ML IV SOLN
8.0000 mL | Freq: Once | INTRAVENOUS | Status: AC | PRN
  Administered 2022-07-13: 8 mL via INTRAVENOUS

## 2022-07-13 MED ORDER — VANCOMYCIN HCL 1750 MG/350ML IV SOLN
1750.0000 mg | Freq: Once | INTRAVENOUS | Status: AC
  Administered 2022-07-13: 1750 mg via INTRAVENOUS
  Filled 2022-07-13: qty 350

## 2022-07-13 MED ORDER — INSULIN ASPART 100 UNIT/ML IJ SOLN
7.0000 [IU] | Freq: Three times a day (TID) | INTRAMUSCULAR | Status: DC
  Administered 2022-07-13 – 2022-07-14 (×5): 7 [IU] via SUBCUTANEOUS

## 2022-07-13 NOTE — Consult Note (Signed)
[facility]       Reason for Consult:bacteremia    Referring Physician: Dr. 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)   Pure hypercholesterolemia   Hyponatremia   Hypomagnesemia   Sepsis due to pneumonia    apixaban  5 mg Oral BID   atorvastatin  80 mg Oral Daily   budesonide (PULMICORT) nebulizer solution  0.5 mg Nebulization BID   busPIRone  10 mg Oral BID   carvedilol  3.125 mg Oral BID   clopidogrel  75 mg Oral Q breakfast   digoxin  125 mcg Oral Daily   famotidine  20 mg Oral BID   insulin aspart  0-20 Units Subcutaneous TID WC   insulin aspart  7 Units Subcutaneous TID WC   insulin glargine-yfgn  40 Units Subcutaneous Daily   ipratropium-albuterol  3 mL Nebulization QID   spironolactone  25 mg Oral Daily    Recommendations: MRI back -ordered TEE - requested via [facility] Repeat blood cultures  Assessment: Bacteremia, back pain and concern for disseminated infection.     HPI: David [name] is a 62 y.o. male with poorly-controlled diabetes, CHF, afib who came in with 6 days of symptoms including cough, back pain, fever and rigors.  Now positive with MRSA bacteremia. 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  WBC 19.1.  Feels better since admission.  Blood cultures positive in 4/4 bottles.  Has back pain in mid back.  CT chest with no opacity, no PE.  TTE without concerns.   Review of Systems:  Constitutional: positive for fevers and chills All other systems reviewed and are negative    Past Medical History:  Diagnosis Date   Cardiomyopathy    a. EF 45% in 2019.   CHF (congestive heart failure)    Chronic anticoagulation 09/30/2017   Diabetes mellitus type 2 in nonobese    Does not  have health insurance    Essential hypertension 09/26/2017   Financial difficulties    H/O noncompliance with medical treatment, presenting hazards to health    Non-insulin treated type 2 diabetes mellitus 09/26/2017   Persistent atrial fibrillation    Sleep apnea suspected 09/30/2017    Social History   Tobacco Use   Smoking status: Former    Years: 15    Types: Cigarettes    Quit date: 03/18/2018    Years since quitting: 4.3   Smokeless tobacco: Never   Tobacco comments:    09/26/2017 "2-3 cigarettes/month now"  Vaping Use   Vaping Use: Never used  Substance Use Topics   Alcohol use: Not Currently    Alcohol/week: 3.0 standard drinks of alcohol    Types: 3 Cans of beer per week    Comment: States he quit drinking 8-9 months ago   Drug use: Never    Family History  Problem Relation Age of Onset   Hypertension Mother     No Known Allergies  Physical Exam: Constitutional: in no apparent distress  Vitals:   07/13/22 0931 07/13/22 1143  BP:  113/75  Pulse:  89  Resp:  20  Temp:    SpO2: 95% 93%   EYES: anicteric Respiratory: normal respiratory effort GI: soft Musculoskeletal: right foot wrapped  Lab Results  Component  Value Date   WBC 19.1 (H) 07/13/2022   HGB 12.3 (L) 07/13/2022   HCT 36.7 (L) 07/13/2022   MCV 94.3 07/13/2022   PLT 205 07/13/2022    Lab Results  Component Value Date   CREATININE 1.32 (H) 07/13/2022   BUN 35 (H) 07/13/2022   NA 124 (L) 07/13/2022   K 4.8 07/13/2022   CL 93 (L) 07/13/2022   CO2 22 07/13/2022    Lab Results  Component Value Date   ALT 33 07/13/2022   AST 20 07/13/2022   ALKPHOS 117 07/13/2022     Microbiology: Recent Results (from the past 240 hour(s))  Resp panel by RT-PCR (RSV, Flu A&B, Covid) Anterior Nasal Swab     Status: None   Collection Time: 07/09/22  5:56 PM   Specimen: Anterior Nasal Swab  Result Value Ref Range Status   SARS Coronavirus 2 by RT PCR NEGATIVE NEGATIVE Final    Comment:  (NOTE) SARS-CoV-2 target nucleic acids are NOT DETECTED.  The SARS-CoV-2 RNA is generally detectable in upper respiratory specimens during the acute phase of infection. The lowest concentration of SARS-CoV-2 viral copies this assay can detect is 138 copies/mL. A negative result does not preclude SARS-Cov-2 infection and should not be used as the sole basis for treatment or other patient management decisions. A negative result may occur with  improper specimen collection/handling, submission of specimen other than nasopharyngeal swab, presence of viral mutation(s) within the areas targeted by this assay, and inadequate number of viral copies(<138 copies/mL). A negative result must be combined with clinical observations, patient history, and epidemiological information. The expected result is Negative.  Fact Sheet for Patients:  BloggerCourse.com  Fact Sheet for Healthcare Providers:  SeriousBroker.it  This test is no t yet approved or cleared by the [place] FDA and  has been authorized for detection and/or diagnosis of SARS-CoV-2 by FDA under an Emergency Use Authorization (EUA). This EUA will remain  in effect (meaning this test can be used) for the duration of the COVID-19 declaration under Section 564(b)(1) of the Act, 21 U.S.C.section 360bbb-3(b)(1), unless the authorization is terminated  or revoked sooner.       Influenza A by PCR NEGATIVE NEGATIVE Final   Influenza B by PCR NEGATIVE NEGATIVE Final    Comment: (NOTE) The Xpert Xpress SARS-CoV-2/FLU/RSV plus assay is intended as an aid in the diagnosis of influenza from Nasopharyngeal swab specimens and should not be used as a sole basis for treatment. Nasal washings and aspirates are unacceptable for Xpert Xpress SARS-CoV-2/FLU/RSV testing.  Fact Sheet for Patients: BloggerCourse.com  Fact Sheet for Healthcare  Providers: SeriousBroker.it  This test is not yet approved or cleared by the [place] FDA and has been authorized for detection and/or diagnosis of SARS-CoV-2 by FDA under an Emergency Use Authorization (EUA). This EUA will remain in effect (meaning this test can be used) for the duration of the COVID-19 declaration under Section 564(b)(1) of the Act, 21 U.S.C. section 360bbb-3(b)(1), unless the authorization is terminated or revoked.     Resp Syncytial Virus by PCR NEGATIVE NEGATIVE Final    Comment: (NOTE) Fact Sheet for Patients: BloggerCourse.com  Fact Sheet for Healthcare Providers: SeriousBroker.it  This test is not yet approved or cleared by the [place] FDA and has been authorized for detection and/or diagnosis of SARS-CoV-2 by FDA under an Emergency Use Authorization (EUA). This EUA will remain in effect (meaning this test can be used) for the duration of the COVID-19 declaration under Section  564(b)(1) of the Act, 21 U.S.C. section 360bbb-3(b)(1), unless the authorization is terminated or revoked.  Performed at [facility], [address] [place], [place] [address]   Blood culture (routine x 2)     Status: Abnormal (Preliminary result)   Collection Time: 07/12/22  8:48 AM   Specimen: BLOOD RIGHT ARM  Result Value Ref Range Status   Specimen Description   Final    BLOOD RIGHT ARM Performed at [facility], [address] [place], [place] [address]    Special Requests   Final    BOTTLES DRAWN AEROBIC AND ANAEROBIC Blood Culture results may not be optimal due to an inadequate volume of blood received in culture bottles Performed at [facility], [address], [place], [place] [address]    Culture  Setup Time   Final    GRAM POSITIVE COCCI IN CLUSTERS IN BOTH AEROBIC AND ANAEROBIC BOTTLES CRITICAL VALUE NOTED.  VALUE IS CONSISTENT  WITH PREVIOUSLY REPORTED AND CALLED VALUE. Performed at [facility], [address] [place], [place] [address]    Culture STAPHYLOCOCCUS AUREUS (A)  Final   Report Status PENDING  Incomplete  Blood culture (routine x 2)     Status: Abnormal (Preliminary result)   Collection Time: 07/12/22  8:50 AM   Specimen: BLOOD  Result Value Ref Range Status   Specimen Description   Final    BLOOD BLOOD LEFT ARM Performed at [facility] [address] [place], [place] [address]    Special Requests   Final    BOTTLES DRAWN AEROBIC AND ANAEROBIC Blood Culture results may not be optimal due to an inadequate volume of blood received in culture bottles Performed at [facility], [address] [place], [place] [address]    Culture  Setup Time   Final    GRAM POSITIVE COCCI IN CLUSTERS IN BOTH AEROBIC AND ANAEROBIC BOTTLES CRITICAL RESULT CALLED TO, READ BACK BY AND VERIFIED WITH: PHARMD M. Doran Durand 07/13/22 @ 0152 BY AB    Culture (A)  Final    STAPHYLOCOCCUS AUREUS SUSCEPTIBILITIES TO FOLLOW Performed at [facility], [address] [place], [place] [address]    Report Status PENDING  Incomplete  Blood Culture ID Panel (Reflexed)     Status: Abnormal   Collection Time: 07/12/22  8:50 AM  Result Value Ref Range Status   Enterococcus faecalis NOT DETECTED NOT DETECTED Final   Enterococcus Faecium NOT DETECTED NOT DETECTED Final   Listeria monocytogenes NOT DETECTED NOT DETECTED Final   Staphylococcus species DETECTED (A) NOT DETECTED Final    Comment: CRITICAL RESULT CALLED TO, READ BACK BY AND VERIFIED WITH: PHARMD M. LILLISTON 07/13/22 @ 0152 BY AB    Staphylococcus aureus (BCID) DETECTED (A) NOT DETECTED Final    Comment: Methicillin (oxacillin)-resistant Staphylococcus aureus (MRSA). MRSA is predictably resistant to beta-lactam antibiotics (except ceftaroline). Preferred therapy is vancomycin unless clinically contraindicated. Patient  requires contact precautions if  hospitalized. CRITICAL RESULT CALLED TO, READ BACK BY AND VERIFIED WITH: PHARMD M. LILLISTON 07/13/22 @ 0152 BY AB    Staphylococcus epidermidis NOT DETECTED NOT DETECTED Final   Staphylococcus lugdunensis NOT DETECTED NOT DETECTED Final   Streptococcus species NOT DETECTED NOT DETECTED Final   Streptococcus agalactiae NOT DETECTED NOT DETECTED Final   Streptococcus pneumoniae NOT DETECTED NOT DETECTED Final   Streptococcus pyogenes NOT DETECTED NOT DETECTED Final   A.calcoaceticus-baumannii NOT DETECTED NOT DETECTED Final   Bacteroides  fragilis NOT DETECTED NOT DETECTED Final   Enterobacterales NOT DETECTED NOT DETECTED Final   Enterobacter cloacae complex NOT DETECTED NOT DETECTED Final   Escherichia coli NOT DETECTED NOT DETECTED Final   Klebsiella aerogenes NOT DETECTED NOT DETECTED Final   Klebsiella oxytoca NOT DETECTED NOT DETECTED Final   Klebsiella pneumoniae NOT DETECTED NOT DETECTED Final   Proteus species NOT DETECTED NOT DETECTED Final   Salmonella species NOT DETECTED NOT DETECTED Final   Serratia marcescens NOT DETECTED NOT DETECTED Final   Haemophilus influenzae NOT DETECTED NOT DETECTED Final   Neisseria meningitidis NOT DETECTED NOT DETECTED Final   Pseudomonas aeruginosa NOT DETECTED NOT DETECTED Final   Stenotrophomonas maltophilia NOT DETECTED NOT DETECTED Final   Candida albicans NOT DETECTED NOT DETECTED Final   Candida auris NOT DETECTED NOT DETECTED Final   Candida glabrata NOT DETECTED NOT DETECTED Final   Candida krusei NOT DETECTED NOT DETECTED Final   Candida parapsilosis NOT DETECTED NOT DETECTED Final   Candida tropicalis NOT DETECTED NOT DETECTED Final   Cryptococcus neoformans/gattii NOT DETECTED NOT DETECTED Final   Meth resistant mecA/C and MREJ DETECTED (A) NOT DETECTED Final    Comment: CRITICAL RESULT CALLED TO, READ BACK BY AND VERIFIED WITH: PHARMD M. LILLISTON 07/13/22 @ 0152 BY AB Performed at [facility], [address] [place], [place] [address]   SARS Coronavirus 2 by RT PCR (hospital order, performed in [facility] lab) *cepheid single result test* Anterior Nasal Swab     Status: None   Collection Time: 07/12/22  9:24 AM   Specimen: Anterior Nasal Swab  Result Value Ref Range Status   SARS Coronavirus 2 by RT PCR NEGATIVE NEGATIVE Final    Comment: (NOTE) SARS-CoV-2 target nucleic acids are NOT DETECTED.  The SARS-CoV-2 RNA is generally detectable in upper and lower respiratory specimens during the acute phase of infection. The lowest concentration of SARS-CoV-2 viral copies this assay can detect is 250 copies / mL. A negative result does not preclude SARS-CoV-2 infection and should not be used as the sole basis for treatment or other patient management decisions.  A negative result may occur with improper specimen collection / handling, submission of specimen other than nasopharyngeal swab, presence of viral mutation(s) within the areas targeted by this assay, and inadequate number of viral copies (<250 copies / mL). A negative result must be combined with clinical observations, patient history, and epidemiological information.  Fact Sheet for Patients:   RoadLapTop.co.zahttps://www.fda.gov/media/158405/download  Fact Sheet for Healthcare Providers: http://kim-miller.com/https://www.fda.gov/media/158404/download  This test is not yet approved or  cleared by the [place] FDA and has been authorized for detection and/or diagnosis of SARS-CoV-2 by FDA under an Emergency Use Authorization (EUA).  This EUA will remain in effect (meaning this test can be used) for the duration of the COVID-19 declaration under Section 564(b)(1) of the Act, 21 U.S.C. section 360bbb-3(b)(1), unless the authorization is terminated or revoked sooner.  Performed at [facility], [address] [place], [place] [address]   Respiratory (~20 pathogens) panel by PCR     Status: None   Collection  Time: 07/12/22  2:03 PM   Specimen: Nasopharyngeal Swab; Respiratory  Result Value Ref Range Status   Adenovirus NOT DETECTED NOT DETECTED Final   Coronavirus 229E NOT DETECTED NOT DETECTED Final    Comment: (NOTE) The Coronavirus on the Respiratory Panel, DOES NOT test for the novel  Coronavirus (2019 nCoV)    Coronavirus HKU1 NOT DETECTED NOT  DETECTED Final   Coronavirus NL63 NOT DETECTED NOT DETECTED Final   Coronavirus OC43 NOT DETECTED NOT DETECTED Final   Metapneumovirus NOT DETECTED NOT DETECTED Final   Rhinovirus / Enterovirus NOT DETECTED NOT DETECTED Final   Influenza A NOT DETECTED NOT DETECTED Final   Influenza B NOT DETECTED NOT DETECTED Final   Parainfluenza Virus 1 NOT DETECTED NOT DETECTED Final   Parainfluenza Virus 2 NOT DETECTED NOT DETECTED Final   Parainfluenza Virus 3 NOT DETECTED NOT DETECTED Final   Parainfluenza Virus 4 NOT DETECTED NOT DETECTED Final   Respiratory Syncytial Virus NOT DETECTED NOT DETECTED Final   Bordetella pertussis NOT DETECTED NOT DETECTED Final   Bordetella Parapertussis NOT DETECTED NOT DETECTED Final   Chlamydophila pneumoniae NOT DETECTED NOT DETECTED Final   Mycoplasma pneumoniae NOT DETECTED NOT DETECTED Final    Comment: Performed at [facility], [address], [place], [place] [address]    Gardiner Barefoot, MD [facility] [email or web address] 07/13/2022, 2:51 PM

## 2022-07-13 NOTE — Progress Notes (Signed)
Pt foot left OTA by MD. Cleansed entire foot with sterile gauze & NS. Painted with betadine per pt's request, stated surgeon instructed him to do so, covered with xeroform, wrapped with Kerlix, secured with tape

## 2022-07-13 NOTE — Progress Notes (Signed)
PROGRESS NOTE  David [name] ZOX:[id number] DOB: 1961-02-02 DOA: 07/12/2022 PCP: [name], MD   LOS: 1 day   Brief Narrative / Interim history: 62 y.o. 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 His appetite has been decreased. He had a fever at home of 105 F. He was having significant chills and rigors.  His rigors were so intense that produced the above-mentioned back pain.   Subjective / 24h Interval events: No longer having chills, complains of intermittent coughing, and also mid back pain.  Assesement and Plan: Principal Problem:   Acute respiratory failure with hypoxia Active Problems:   Essential hypertension   Insulin dependent type 2 diabetes mellitus   Atrial fibrillation with RVR   Sleep apnea suspected   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)   Pure hypercholesterolemia   Hyponatremia   Hypomagnesemia   Sepsis due to pneumonia  Principal problem Sepsis due to MRSA bacteremia -blood cultures obtained on admission showing MRSA.  He has a history of MRSA bacteremia in 2023 felt to be due to diabetic toe infections.  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 [name]. -Back pain is worrisome, he reports like he pulled a muscle in the setting of severe chills but with his bacteremia there is concern about discitis/osteomyelitis.  Obtain MRI of the T-spine, but waiting on ID input to see if we need more imaging -2D echo today without vegetations.  Needs a TEE  Active problems Right foot ulcer -at the site of prior ray  amputation.  Patient tells me that it is improving.  There was concern for osteomyelitis last year around the same time of his MRSA bacteremia.  Patient tells me that surgical site is improving.  I do not see any significant drainage/cellulitis, will likely need an MRI of the foot  Hyponatremia -GI losses, hold furosemide  Essential hypertension -hold Entresto, Coreg, spironolactone and furosemide due to soft blood pressures this morning.  Monitor  CKD 3A -baseline creatinine 1.2-1.5, currently at baseline.  Continue to monitor.  Hypomagnesemia -replenish magnesium as indicated and continue to monitor  A fib with RVR -rate controlled, continue Eliquis, Coreg.  Followed by cardiology as an outpatient  Chronic combined CHF -his EF has improved.  Hold Entresto in the setting of hypotension  Hyperlipidemia -continue statin  IDDM, poorly controlled, with hyperglycemia-continue glargine, sliding scale.  Add mealtime insulin today due to persistent hyperglycemia  CBG (last 3)  Recent Labs    07/13/22 0137 07/13/22 0759 07/13/22 1156  GLUCAP 425* 396* 383*    Lab Results  Component Value Date   HGBA1C 9.1 (H) 07/13/2022     Scheduled Meds:  apixaban  5 mg Oral BID   atorvastatin  80 mg Oral Daily   budesonide (PULMICORT) nebulizer solution  0.5 mg Nebulization BID   busPIRone  10 mg Oral BID   carvedilol  6.25 mg Oral BID   clopidogrel  75 mg Oral Q breakfast   digoxin  125 mcg Oral Daily   famotidine  20 mg Oral BID   insulin aspart  0-20 Units Subcutaneous TID WC   insulin glargine-yfgn  40 Units Subcutaneous  Daily   ipratropium-albuterol  3 mL Nebulization QID   sacubitril-valsartan  1 tablet Oral BID   spironolactone  25 mg Oral Daily   Continuous Infusions:  [START ON 07/14/2022] vancomycin     PRN Meds:.acetaminophen **OR** acetaminophen, HYDROcodone-acetaminophen, ondansetron **OR** ondansetron (ZOFRAN) IV, mouth rinse  Current Outpatient Medications  Medication  Instructions   apixaban (ELIQUIS) 5 mg, Oral, 2 times daily   aspirin EC 81 mg, Oral, Daily, Swallow whole.   atorvastatin (LIPITOR) 80 mg, Oral, Daily   Basaglar KwikPen 40 Units, Subcutaneous, Daily   busPIRone (BUSPAR) 10 mg, Oral, 2 times daily   carvedilol (COREG) 6.25 mg, Oral, 2 times daily   clopidogrel (PLAVIX) 75 mg, Oral, Daily with breakfast   digoxin (LANOXIN) 0.125 MG tablet Take 1 tablet (125 mcg total) by mouth once daily.   famotidine (PEPCID) 20 MG tablet Take 1 tablet (20 mg total) by mouth 2 times daily.   fluticasone (FLONASE) 50 MCG/ACT nasal spray 2 sprays, Each Nare, Daily   furosemide (LASIX) 40 mg, Oral, Daily   HumaLOG KwikPen 15 Units, Subcutaneous, 3 times daily   HYDROcodone-acetaminophen (NORCO/VICODIN) 5-325 MG tablet Take 1 tablet by mouth every 4 (four) hours as needed for up to 7 days for Moderate pain (4-6) or Severe pain (7-10).   sacubitril-valsartan (ENTRESTO) 49-51 MG 1 tablet, Oral, 2 times daily, Needs to see cardiology for refills   spironolactone (ALDACTONE) 25 mg, Oral, Daily    Diet Orders (From admission, onward)     Start     Ordered   07/12/22 1404  Diet heart healthy/carb modified Room service appropriate? Yes; Fluid consistency: Thin  Diet effective now       Question Answer Comment  Diet-HS Snack? Nothing   Room service appropriate? Yes   Fluid consistency: Thin      07/12/22 1405            DVT prophylaxis:  apixaban (ELIQUIS) tablet 5 mg   Lab Results  Component Value Date   PLT 205 07/13/2022      Code Status: Full Code  Family Communication: no family at bedside   Status is: Inpatient  Remains inpatient appropriate because: MRSA bacteremia  Level of care: Progressive  Consultants:  ID  Objective: Vitals:   07/13/22 0800 07/13/22 0833 07/13/22 0931 07/13/22 1143  BP:  98/73  113/75  Pulse:  76  89  Resp: (!) 26   20  Temp:      TempSrc:      SpO2:  95% 95% 93%  Weight:      Height:         Intake/Output Summary (Last 24 hours) at 07/13/2022 1203 Last data filed at 07/13/2022 1200 Gross per 24 hour  Intake 3170 ml  Output 2825 ml  Net 345 ml   Wt Readings from Last 3 Encounters:  07/12/22 81 kg  07/09/22 81.6 kg  07/01/22 83.5 kg    Examination:  Constitutional: NAD Eyes: no scleral icterus ENMT: Mucous membranes are moist.  Neck: normal, supple Respiratory: clear to auscultation bilaterally, no wheezing, no crackles. Normal respiratory effort. No accessory muscle use.  Cardiovascular: Regular rate and rhythm, no murmurs / rubs / gallops. No LE edema.  Abdomen: non distended, no tenderness. Bowel sounds positive.  Musculoskeletal: no clubbing / cyanosis.     Data Reviewed: I have independently reviewed following labs and imaging studies   CBC Recent Labs  Lab 07/09/22 1756 07/12/22 0850 07/13/22 0419  WBC 13.9* 17.0* 19.1*  HGB 13.9 15.2 12.3*  HCT 40.0 44.9 36.7*  PLT 180 248 205  MCV 92.2 92.4 94.3  MCH 32.0 31.3 31.6  MCHC 34.8 33.9 33.5  RDW 13.7 14.3 14.6  LYMPHSABS 0.4* 0.7  --   MONOABS 1.1* 1.3*  --   EOSABS 0.0 0.0  --   BASOSABS 0.1 0.1  --     Recent Labs  Lab 07/09/22 1756 07/12/22 0850 07/12/22 1050 07/12/22 1655 07/13/22 0410 07/13/22 0419  NA 125* 126*  --  126*  --  124*  K 3.9 4.8  --  5.0  --  4.8  CL 93* 90*  --  94*  --  93*  CO2 20* 24  --  23  --  22  GLUCOSE 170* 375*  --  422*  --  437*  BUN 33* 35*  --  34*  --  35*  CREATININE 1.38* 1.41*  --  1.27*  --  1.32*  CALCIUM 8.8* 9.3  --  8.6*  --  8.3*  AST 44* 46*  --   --   --  20  ALT 38 52*  --   --   --  33  ALKPHOS 98 171*  --   --   --  117  BILITOT 1.6* 1.4*  --   --   --  1.0  ALBUMIN 3.1* 3.0*  --   --   --  2.3*  LATICACIDVEN 1.5 3.8* 2.2*  --   --   --   HGBA1C  --   --   --   --  9.1*  --   BNP  --  319.6*  --   --   --   --      ------------------------------------------------------------------------------------------------------------------ No results for input(s): "CHOL", "HDL", "LDLCALC", "TRIG", "CHOLHDL", "LDLDIRECT" in the last 72 hours.  Lab Results  Component Value Date   HGBA1C 9.1 (H) 07/13/2022   ------------------------------------------------------------------------------------------------------------------ No results for input(s): "TSH", "T4TOTAL", "T3FREE", "THYROIDAB" in the last 72 hours.  Invalid input(s): "FREET3"  Cardiac Enzymes No results for input(s): "CKMB", "TROPONINI", "MYOGLOBIN" in the last 168 hours.  Invalid input(s): "CK" ------------------------------------------------------------------------------------------------------------------    Component Value Date/Time   BNP 319.6 (H) 07/12/2022 0850    CBG: Recent Labs  Lab 07/12/22 1611 07/12/22 2020 07/13/22 0137 07/13/22 0759 07/13/22 1156  GLUCAP 407* 448* 425* 396* 383*    Recent Results (from the past 240 hour(s))  Resp panel by RT-PCR (RSV, Flu A&B, Covid) Anterior Nasal Swab     Status: None   Collection Time: 07/09/22  5:56 PM   Specimen: Anterior Nasal Swab  Result Value Ref Range Status   SARS Coronavirus 2 by RT PCR NEGATIVE NEGATIVE Final    Comment: (NOTE) SARS-CoV-2 target nucleic acids are NOT DETECTED.  The SARS-CoV-2 RNA is generally detectable in upper respiratory specimens during the acute phase of infection. The lowest concentration of SARS-CoV-2 viral copies this assay can detect is 138 copies/mL. A negative result does not preclude SARS-Cov-2 infection and should not be used as the sole basis for treatment or other patient management decisions. A negative result may occur with  improper specimen collection/handling, submission of specimen other than nasopharyngeal swab, presence of viral mutation(s) within the areas targeted by this assay, and inadequate number of viral copies(<138  copies/mL). A negative result must be combined with clinical observations, patient history, and epidemiological information. The expected result is Negative.  Fact Sheet for Patients:  BloggerCourse.com  Fact Sheet for  Healthcare Providers:  SeriousBroker.it  This test is no t yet approved or cleared by the [place] and  has been authorized for detection and/or diagnosis of SARS-CoV-2 by FDA under an Emergency Use Authorization (EUA). This EUA will remain  in effect (meaning this test can be used) for the duration of the COVID-19 declaration under Section 564(b)(1) of the Act, 21 U.S.C.section 360bbb-3(b)(1), unless the authorization is terminated  or revoked sooner.       Influenza A by PCR NEGATIVE NEGATIVE Final   Influenza B by PCR NEGATIVE NEGATIVE Final    Comment: (NOTE) The Xpert Xpress SARS-CoV-2/FLU/RSV plus assay is intended as an aid in the diagnosis of influenza from Nasopharyngeal swab specimens and should not be used as a sole basis for treatment. Nasal washings and aspirates are unacceptable for Xpert Xpress SARS-CoV-2/FLU/RSV testing.  Fact Sheet for Patients: BloggerCourse.com  Fact Sheet for Healthcare Providers: SeriousBroker.it  This test is not yet approved or cleared by the [place] FDA and has been authorized for detection and/or diagnosis of SARS-CoV-2 by FDA under an Emergency Use Authorization (EUA). This EUA will remain in effect (meaning this test can be used) for the duration of the COVID-19 declaration under Section 564(b)(1) of the Act, 21 U.S.C. section 360bbb-3(b)(1), unless the authorization is terminated or revoked.     Resp Syncytial Virus by PCR NEGATIVE NEGATIVE Final    Comment: (NOTE) Fact Sheet for Patients: BloggerCourse.com  Fact Sheet for Healthcare  Providers: SeriousBroker.it  This test is not yet approved or cleared by the [place] FDA and has been authorized for detection and/or diagnosis of SARS-CoV-2 by FDA under an Emergency Use Authorization (EUA). This EUA will remain in effect (meaning this test can be used) for the duration of the COVID-19 declaration under Section 564(b)(1) of the Act, 21 U.S.C. section 360bbb-3(b)(1), unless the authorization is terminated or revoked.  Performed at [facility], [address], [place], [place] [address]   Blood culture (routine x 2)     Status: Abnormal (Preliminary result)   Collection Time: 07/12/22  8:48 AM   Specimen: BLOOD RIGHT ARM  Result Value Ref Range Status   Specimen Description   Final    BLOOD RIGHT ARM Performed at [facility], [address], [place], [place] [address]    Special Requests   Final    BOTTLES DRAWN AEROBIC AND ANAEROBIC Blood Culture results may not be optimal due to an inadequate volume of blood received in culture bottles Performed at [facility], [address] [place], [place] [address]    Culture  Setup Time   Final    GRAM POSITIVE COCCI IN CLUSTERS IN BOTH AEROBIC AND ANAEROBIC BOTTLES CRITICAL VALUE NOTED.  VALUE IS CONSISTENT WITH PREVIOUSLY REPORTED AND CALLED VALUE. Performed at [facility], [address], [place], [place] [address]    Culture STAPHYLOCOCCUS AUREUS (A)  Final   Report Status PENDING  Incomplete  Blood culture (routine x 2)     Status: Abnormal (Preliminary result)   Collection Time: 07/12/22  8:50 AM   Specimen: BLOOD  Result Value Ref Range Status   Specimen Description   Final    BLOOD BLOOD LEFT ARM Performed at [facility], [address] [place], [place] [address]    Special Requests   Final    BOTTLES DRAWN AEROBIC AND ANAEROBIC Blood Culture results may not be optimal due to an inadequate volume of  blood  received in culture bottles Performed at [facility], [address] [place], [place] [address]    Culture  Setup Time   Final    GRAM POSITIVE COCCI IN CLUSTERS IN BOTH AEROBIC AND ANAEROBIC BOTTLES CRITICAL RESULT CALLED TO, READ BACK BY AND VERIFIED WITH: PHARMD M. Doran DurandLILLISTON 07/13/22 @ 0152 BY AB    Culture (A)  Final    STAPHYLOCOCCUS AUREUS SUSCEPTIBILITIES TO FOLLOW Performed at [facility], [address] [place], [place] [address]    Report Status PENDING  Incomplete  Blood Culture ID Panel (Reflexed)     Status: Abnormal   Collection Time: 07/12/22  8:50 AM  Result Value Ref Range Status   Enterococcus faecalis NOT DETECTED NOT DETECTED Final   Enterococcus Faecium NOT DETECTED NOT DETECTED Final   Listeria monocytogenes NOT DETECTED NOT DETECTED Final   Staphylococcus species DETECTED (A) NOT DETECTED Final    Comment: CRITICAL RESULT CALLED TO, READ BACK BY AND VERIFIED WITH: PHARMD M. LILLISTON 07/13/22 @ 0152 BY AB    Staphylococcus aureus (BCID) DETECTED (A) NOT DETECTED Final    Comment: Methicillin (oxacillin)-resistant Staphylococcus aureus (MRSA). MRSA is predictably resistant to beta-lactam antibiotics (except ceftaroline). Preferred therapy is vancomycin unless clinically contraindicated. Patient requires contact precautions if  hospitalized. CRITICAL RESULT CALLED TO, READ BACK BY AND VERIFIED WITH: PHARMD M. LILLISTON 07/13/22 @ 0152 BY AB    Staphylococcus epidermidis NOT DETECTED NOT DETECTED Final   Staphylococcus lugdunensis NOT DETECTED NOT DETECTED Final   Streptococcus species NOT DETECTED NOT DETECTED Final   Streptococcus agalactiae NOT DETECTED NOT DETECTED Final   Streptococcus pneumoniae NOT DETECTED NOT DETECTED Final   Streptococcus pyogenes NOT DETECTED NOT DETECTED Final   A.calcoaceticus-baumannii NOT DETECTED NOT DETECTED Final   Bacteroides fragilis NOT DETECTED NOT DETECTED Final   Enterobacterales NOT DETECTED  NOT DETECTED Final   Enterobacter cloacae complex NOT DETECTED NOT DETECTED Final   Escherichia coli NOT DETECTED NOT DETECTED Final   Klebsiella aerogenes NOT DETECTED NOT DETECTED Final   Klebsiella oxytoca NOT DETECTED NOT DETECTED Final   Klebsiella pneumoniae NOT DETECTED NOT DETECTED Final   Proteus species NOT DETECTED NOT DETECTED Final   Salmonella species NOT DETECTED NOT DETECTED Final   Serratia marcescens NOT DETECTED NOT DETECTED Final   Haemophilus influenzae NOT DETECTED NOT DETECTED Final   Neisseria meningitidis NOT DETECTED NOT DETECTED Final   Pseudomonas aeruginosa NOT DETECTED NOT DETECTED Final   Stenotrophomonas maltophilia NOT DETECTED NOT DETECTED Final   Candida albicans NOT DETECTED NOT DETECTED Final   Candida auris NOT DETECTED NOT DETECTED Final   Candida glabrata NOT DETECTED NOT DETECTED Final   Candida krusei NOT DETECTED NOT DETECTED Final   Candida parapsilosis NOT DETECTED NOT DETECTED Final   Candida tropicalis NOT DETECTED NOT DETECTED Final   Cryptococcus neoformans/gattii NOT DETECTED NOT DETECTED Final   Meth resistant mecA/C and MREJ DETECTED (A) NOT DETECTED Final    Comment: CRITICAL RESULT CALLED TO, READ BACK BY AND VERIFIED WITH: PHARMD M. LILLISTON 07/13/22 @ 0152 BY AB Performed at [facility], [address] [place], [place] [address]   SARS Coronavirus 2 by RT PCR (hospital order, performed in [facility] hospital lab) *cepheid single result test* Anterior Nasal Swab     Status: None   Collection Time: 07/12/22  9:24 AM   Specimen: Anterior Nasal Swab  Result Value Ref Range Status   SARS Coronavirus 2 by RT PCR NEGATIVE  NEGATIVE Final    Comment: (NOTE) SARS-CoV-2 target nucleic acids are NOT DETECTED.  The SARS-CoV-2 RNA is generally detectable in upper and lower respiratory specimens during the acute phase of infection. The lowest concentration of SARS-CoV-2 viral copies this assay can detect is 250 copies / mL. A  negative result does not preclude SARS-CoV-2 infection and should not be used as the sole basis for treatment or other patient management decisions.  A negative result may occur with improper specimen collection / handling, submission of specimen other than nasopharyngeal swab, presence of viral mutation(s) within the areas targeted by this assay, and inadequate number of viral copies (<250 copies / mL). A negative result must be combined with clinical observations, patient history, and epidemiological information.  Fact Sheet for Patients:   RoadLapTop.co.za  Fact Sheet for Healthcare Providers: http://kim-miller.com/  This test is not yet approved or  cleared by the [place] FDA and has been authorized for detection and/or diagnosis of SARS-CoV-2 by FDA under an Emergency Use Authorization (EUA).  This EUA will remain in effect (meaning this test can be used) for the duration of the COVID-19 declaration under Section 564(b)(1) of the Act, 21 U.S.C. section 360bbb-3(b)(1), unless the authorization is terminated or revoked sooner.  Performed at [facility], [address] [place], [place] [address]   Respiratory (~20 pathogens) panel by PCR     Status: None   Collection Time: 07/12/22  2:03 PM   Specimen: Nasopharyngeal Swab; Respiratory  Result Value Ref Range Status   Adenovirus NOT DETECTED NOT DETECTED Final   Coronavirus 229E NOT DETECTED NOT DETECTED Final    Comment: (NOTE) The Coronavirus on the Respiratory Panel, DOES NOT test for the novel  Coronavirus (2019 nCoV)    Coronavirus HKU1 NOT DETECTED NOT DETECTED Final   Coronavirus NL63 NOT DETECTED NOT DETECTED Final   Coronavirus OC43 NOT DETECTED NOT DETECTED Final   Metapneumovirus NOT DETECTED NOT DETECTED Final   Rhinovirus / Enterovirus NOT DETECTED NOT DETECTED Final   Influenza A NOT DETECTED NOT DETECTED Final   Influenza B NOT DETECTED  NOT DETECTED Final   Parainfluenza Virus 1 NOT DETECTED NOT DETECTED Final   Parainfluenza Virus 2 NOT DETECTED NOT DETECTED Final   Parainfluenza Virus 3 NOT DETECTED NOT DETECTED Final   Parainfluenza Virus 4 NOT DETECTED NOT DETECTED Final   Respiratory Syncytial Virus NOT DETECTED NOT DETECTED Final   Bordetella pertussis NOT DETECTED NOT DETECTED Final   Bordetella Parapertussis NOT DETECTED NOT DETECTED Final   Chlamydophila pneumoniae NOT DETECTED NOT DETECTED Final   Mycoplasma pneumoniae NOT DETECTED NOT DETECTED Final    Comment: Performed at [facility], [address], [place], [place] [address]     Radiology Studies: ECHOCARDIOGRAM COMPLETE  Result Date: 07/13/2022    ECHOCARDIOGRAM REPORT   Patient Name:   NISSIM FLEISCHER Date of Exam: 07/13/2022 Medical Rec #:  [id number]   Height:       72.0 in Accession #:    [id number]  Weight:       178.6 lb Date of Birth:  1960/10/13    BSA:          2.030 m Patient Age:    61 years    BP:           117/81 mmHg Patient Gender: M           HR:           81 bpm. Exam Location:  Inpatient Procedure: 2D Echo, Color Doppler and Cardiac Doppler Indications:    Bacteremia  History:        Patient has prior history of Echocardiogram examinations, most                 recent 09/03/2021. Arrythmias:Atrial Fibrillation; Risk                 Factors:Hypertension, Dyslipidemia and Current Smoker. OSA,                 Sepsis.  Sonographer:    Milbert Coulter Referring Phys: [id number] [facility]  Sonographer Comments: OSA. IMPRESSIONS  1. Left ventricular ejection fraction, by estimation, is 60 to 65%. The left ventricle has normal function. The left ventricle has no regional wall motion abnormalities. Left ventricular diastolic function could not be evaluated.  2. Right ventricular systolic function is normal. The right ventricular size is normal.  3. Left atrial size was mildly dilated.  4. Right atrial size was mildly dilated.  5. The mitral valve is normal in  structure. Trivial mitral valve regurgitation. No evidence of mitral stenosis.  6. The aortic valve is normal in structure. Aortic valve regurgitation is not visualized. No aortic stenosis is present.  7. The inferior vena cava is normal in size with greater than 50% respiratory variability, suggesting right atrial pressure of 3 mmHg. FINDINGS  Left Ventricle: Left ventricular ejection fraction, by estimation, is 60 to 65%. The left ventricle has normal function. The left ventricle has no regional wall motion abnormalities. The left ventricular internal cavity size was normal in size. There is  no left ventricular hypertrophy. Left ventricular diastolic function could not be evaluated due to atrial fibrillation. Left ventricular diastolic function could not be evaluated. Right Ventricle: The right ventricular size is normal. Right ventricular systolic function is normal. Left Atrium: Left atrial size was mildly dilated. Right Atrium: Right atrial size was mildly dilated. Pericardium: There is no evidence of pericardial effusion. Mitral Valve: The mitral valve is normal in structure. Mild mitral annular calcification. Trivial mitral valve regurgitation. No evidence of mitral valve stenosis. Tricuspid Valve: The tricuspid valve is normal in structure. Tricuspid valve regurgitation is trivial. No evidence of tricuspid stenosis. Aortic Valve: The aortic valve is normal in structure. Aortic valve regurgitation is not visualized. No aortic stenosis is present. Pulmonic Valve: The pulmonic valve was normal in structure. Pulmonic valve regurgitation is trivial. No evidence of pulmonic stenosis. Aorta: The aortic root is normal in size and structure. Venous: The inferior vena cava is normal in size with greater than 50% respiratory variability, suggesting right atrial pressure of 3 mmHg. IAS/Shunts: No atrial level shunt detected by color flow Doppler.  LEFT VENTRICLE PLAX 2D LVIDd:         4.00 cm     Diastology LVIDs:          2.60 cm     LV e' medial:    9.57 cm/s LV PW:         1.10 cm     LV E/e' medial:  10.0 LV IVS:        1.00 cm     LV e' lateral:   16.33 cm/s LVOT diam:     1.90 cm     LV E/e' lateral: 5.8 LVOT Area:     2.84 cm  LV Volumes (MOD) LV vol d, MOD A2C: 44.9 ml LV vol d, MOD A4C: 59.5 ml LV vol s, MOD A2C: 12.6 ml  LV vol s, MOD A4C: 16.7 ml LV SV MOD A2C:     32.3 ml LV SV MOD A4C:     59.5 ml LV SV MOD BP:      38.0 ml RIGHT VENTRICLE RV Basal diam:  3.60 cm RV Mid diam:    3.60 cm RV S prime:     10.60 cm/s TAPSE (M-mode): 1.8 cm LEFT ATRIUM             Index        RIGHT ATRIUM           Index LA diam:        4.30 cm 2.12 cm/m   RA Area:     18.20 cm LA Vol (A2C):   81.8 ml 40.29 ml/m  RA Volume:   49.10 ml  24.18 ml/m LA Vol (A4C):   68.2 ml 33.59 ml/m LA Biplane Vol: 75.9 ml 37.38 ml/m  MITRAL VALVE MV Area (PHT): 4.06 cm    SHUNTS MV Decel Time: 187 msec    Systemic Diam: 1.90 cm MV E velocity: 95.50 cm/s Olga Millers MD Electronically signed by Olga Millers MD Signature Date/Time: 07/13/2022/9:39:24 AM    Final      Pamella Pert, MD, PhD [facility]  Between 7 am - 7 pm I am available, please contact me via Amion (for emergencies) or Securechat (non urgent messages)  Between 7 pm - 7 am I am not available, please contact night coverage MD/APP via Amion

## 2022-07-13 NOTE — Progress Notes (Signed)
PHARMACY - PHYSICIAN COMMUNICATION CRITICAL VALUE ALERT - BLOOD CULTURE IDENTIFICATION (BCID)  David [name] is an 62 y.o. male who presented to [facility] on 07/12/2022 with a chief complaint of shortness of breath.  Assessment:   Patient admitted with sepsis thought to be due to PNA.  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  No evidence of cardiopulmonary disease per CXR. Currently afebrile with leukocytosis. Blood glucose >400, S. Pneumo UA negative.  4/4 blood cx bottles growing [facility]; BCID + MRSA.   Name of physician (or Provider) Contacted: Johann Capers  Current antibiotics: Rocephin + Zithromax  Changes to prescribed antibiotics recommended:  D/C Rocephin + Zithromax Start Vancomycin 1750mg  IV q24h to target AUC 400-550.  Estimated AUC on this regimen 494 F/U additional ID recommendations, repeat blood cx  Results for orders placed or performed during the hospital encounter of 07/12/22  Blood Culture ID Panel (Reflexed) (Collected: 07/12/2022  8:50 AM)  Result Value Ref Range   Enterococcus faecalis NOT DETECTED NOT DETECTED   Enterococcus Faecium NOT DETECTED NOT DETECTED   Listeria monocytogenes NOT DETECTED NOT DETECTED   Staphylococcus species DETECTED (A) NOT DETECTED   Staphylococcus aureus (BCID) DETECTED (A) NOT DETECTED   Staphylococcus epidermidis NOT DETECTED NOT DETECTED   Staphylococcus lugdunensis NOT DETECTED NOT DETECTED   Streptococcus species NOT DETECTED NOT DETECTED   Streptococcus agalactiae NOT DETECTED NOT DETECTED   Streptococcus pneumoniae NOT DETECTED NOT DETECTED   Streptococcus pyogenes NOT DETECTED NOT DETECTED   A.calcoaceticus-baumannii NOT DETECTED NOT DETECTED   Bacteroides fragilis NOT DETECTED NOT DETECTED   Enterobacterales NOT DETECTED NOT DETECTED   Enterobacter cloacae complex NOT DETECTED NOT DETECTED   Escherichia coli NOT DETECTED NOT DETECTED   Klebsiella aerogenes NOT DETECTED NOT DETECTED   Klebsiella oxytoca NOT DETECTED NOT  DETECTED   Klebsiella pneumoniae NOT DETECTED NOT DETECTED   Proteus species NOT DETECTED NOT DETECTED   Salmonella species NOT DETECTED NOT DETECTED   Serratia marcescens NOT DETECTED NOT DETECTED   Haemophilus influenzae NOT DETECTED NOT DETECTED   Neisseria meningitidis NOT DETECTED NOT DETECTED   Pseudomonas aeruginosa NOT DETECTED NOT DETECTED   Stenotrophomonas maltophilia NOT DETECTED NOT DETECTED   Candida albicans NOT DETECTED NOT DETECTED   Candida auris NOT DETECTED NOT DETECTED   Candida glabrata NOT DETECTED NOT DETECTED   Candida krusei NOT DETECTED NOT DETECTED   Candida parapsilosis NOT DETECTED NOT DETECTED   Candida tropicalis NOT DETECTED NOT DETECTED   Cryptococcus neoformans/gattii NOT DETECTED NOT DETECTED   Meth resistant mecA/C and MREJ DETECTED (A) NOT DETECTED    Junita Push PharmD 07/13/2022  3:08 AM

## 2022-07-13 NOTE — Progress Notes (Signed)
Pt refuses bed alarm. Discussed safety concerns with pt throughout day. States he is not a fall risks. Reviewed and discussed protocol with pt, & expressed concerns. Pt still wants bed alarm off.

## 2022-07-13 NOTE — Progress Notes (Signed)
.   Transition of Care [facility]) Screening Note   Patient Details  Name: David [name] Date of Birth: 25-Nov-1960   Transition of Care [facility]) CM/SW Contact:    Larrie Kass, LCSW Phone Number: 07/13/2022, 1:54 PM    Transition of Care Department [facility]) has reviewed patient and no TOC needs have been identified at this time. We will continue to monitor patient advancement through interdisciplinary progression rounds. If new patient transition needs arise, please place a TOC consult.

## 2022-07-13 NOTE — Progress Notes (Signed)
Removed IV from Left hand, pt c/o pain. & some tingling. Since, pt states more of hand tingles. No CP or radiating pain anywhere else. Provided an ice pack. Pt stated helped some but did not help much.

## 2022-07-14 DIAGNOSIS — L97519 Non-pressure chronic ulcer of other part of right foot with unspecified severity: Secondary | ICD-10-CM

## 2022-07-14 DIAGNOSIS — B9562 Methicillin resistant Staphylococcus aureus infection as the cause of diseases classified elsewhere: Secondary | ICD-10-CM

## 2022-07-14 DIAGNOSIS — R7881 Bacteremia: Secondary | ICD-10-CM

## 2022-07-14 DIAGNOSIS — M549 Dorsalgia, unspecified: Secondary | ICD-10-CM

## 2022-07-14 LAB — COMPREHENSIVE METABOLIC PANEL
ALT: 36 U/L (ref 0–44)
AST: 27 U/L (ref 15–41)
Albumin: 2.5 g/dL — ABNORMAL LOW (ref 3.5–5.0)
Alkaline Phosphatase: 114 U/L (ref 38–126)
Anion gap: 11 (ref 5–15)
BUN: 38 mg/dL — ABNORMAL HIGH (ref 6–20)
CO2: 20 mmol/L — ABNORMAL LOW (ref 22–32)
Calcium: 8.5 mg/dL — ABNORMAL LOW (ref 8.9–10.3)
Chloride: 94 mmol/L — ABNORMAL LOW (ref 98–111)
Creatinine, Ser: 1.24 mg/dL (ref 0.61–1.24)
GFR, Estimated: >60 mL/min (ref >60)
Glucose, Bld: 335 mg/dL — ABNORMAL HIGH (ref 70–99)
Potassium: 4.7 mmol/L (ref 3.5–5.1)
Sodium: 125 mmol/L — ABNORMAL LOW (ref 135–145)
Total Bilirubin: 0.9 mg/dL (ref 0.3–1.2)
Total Protein: 6.1 g/dL — ABNORMAL LOW (ref 6.5–8.1)

## 2022-07-14 LAB — GLUCOSE, CAPILLARY
Glucose-Capillary: 323 mg/dL — ABNORMAL HIGH (ref 70–99)
Glucose-Capillary: 256 mg/dL — ABNORMAL HIGH (ref 70–99)
Glucose-Capillary: 136 mg/dL — ABNORMAL HIGH (ref 70–99)
Glucose-Capillary: 63 mg/dL — ABNORMAL LOW (ref 70–99)
Glucose-Capillary: 108 mg/dL — ABNORMAL HIGH (ref 70–99)

## 2022-07-14 LAB — MAGNESIUM: Magnesium: 2.1 mg/dL (ref 1.7–2.4)

## 2022-07-14 LAB — CULTURE, BLOOD (ROUTINE X 2)

## 2022-07-14 LAB — CBC
HCT: 36.8 % — ABNORMAL LOW (ref 39.0–52.0)
Hemoglobin: 12 g/dL — ABNORMAL LOW (ref 13.0–17.0)
MCH: 31.2 pg (ref 26.0–34.0)
MCHC: 32.6 g/dL (ref 30.0–36.0)
MCV: 95.6 fL (ref 80.0–100.0)
Platelets: 270 10*3/uL (ref 150–400)
RBC: 3.85 MIL/uL — ABNORMAL LOW (ref 4.22–5.81)
RDW: 14.7 % (ref 11.5–15.5)
WBC: 22.6 10*3/uL — ABNORMAL HIGH (ref 4.0–10.5)
nRBC: 0 % (ref 0.0–0.2)

## 2022-07-14 MED ORDER — IPRATROPIUM-ALBUTEROL 0.5-2.5 (3) MG/3ML IN SOLN
3.0000 mL | Freq: Three times a day (TID) | RESPIRATORY_TRACT | Status: DC
  Administered 2022-07-14 – 2022-07-19 (×9): 3 mL via RESPIRATORY_TRACT
  Filled 2022-07-14 (×14): qty 3

## 2022-07-14 MED ORDER — OXYCODONE HCL 5 MG PO TABS
5.0000 mg | ORAL_TABLET | ORAL | Status: DC | PRN
  Administered 2022-07-14 – 2022-07-15 (×4): 10 mg via ORAL
  Filled 2022-07-14 (×4): qty 2

## 2022-07-14 MED ORDER — HYDROXYZINE HCL 10 MG PO TABS
10.0000 mg | ORAL_TABLET | Freq: Three times a day (TID) | ORAL | Status: DC | PRN
  Administered 2022-07-14 – 2022-07-31 (×31): 10 mg via ORAL
  Filled 2022-07-14 (×32): qty 1

## 2022-07-14 MED ORDER — MORPHINE SULFATE (PF) 2 MG/ML IV SOLN
1.0000 mg | Freq: Once | INTRAVENOUS | Status: AC
  Administered 2022-07-14: 1 mg via INTRAVENOUS
  Filled 2022-07-14: qty 1

## 2022-07-14 MED ORDER — LIDOCAINE 5 % EX PTCH
1.0000 | MEDICATED_PATCH | Freq: Every day | CUTANEOUS | Status: DC
  Administered 2022-07-14 – 2022-08-18 (×25): 1 via TRANSDERMAL
  Filled 2022-07-14 (×29): qty 1

## 2022-07-14 MED ORDER — HYDROMORPHONE HCL 1 MG/ML IJ SOLN
0.5000 mg | INTRAMUSCULAR | Status: DC | PRN
  Administered 2022-07-14 – 2022-07-15 (×5): 0.5 mg via INTRAVENOUS
  Filled 2022-07-14 (×5): qty 0.5

## 2022-07-14 NOTE — Progress Notes (Signed)
PROGRESS NOTE  David [name] ZOX:[id number] DOB: 10/08/1960 DOA: 07/12/2022 PCP: [name], MD   LOS: 2 days   Brief Narrative / Interim history: 62 y.o. 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 His appetite has been decreased. He had a fever at home of 105 F. He was having significant chills and rigors.  His rigors were so intense that produced the above-mentioned back pain.   Subjective / 24h Interval events: He is complaining of severe mid back pain.  Also left hand numbness  Assesement and Plan: Principal Problem:   Acute respiratory failure with hypoxia Active Problems:   Essential hypertension   Insulin dependent type 2 diabetes mellitus   Atrial fibrillation with RVR   Sleep apnea suspected   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)   Pure hypercholesterolemia   Hyponatremia   Hypomagnesemia   Sepsis due to pneumonia  Principal problem Sepsis due to MRSA bacteremia -blood cultures obtained on admission showing MRSA.  He has a history of MRSA bacteremia in 2023 felt to be due to diabetic toe infections.  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 [name]. -Back pain is worrisome, he reports like he pulled a muscle in the setting of severe chills.  MRI of the T and L-spine with nonspecific findings, no osteomyelitis or discitis.  2D echo without vegetations, TEE scheduled for tomorrow  Active problems Right foot ulcer -at the site of prior ray amputation.  Patient tells me that it is improving.  Plain imaging without  osteomyelitis  Hyponatremia -hold Lasix, he is also hyperglycemic suggesting a degree of pseudohyponatremia  Essential hypertension -hold Entresto, Coreg decreased due to soft blood pressures in the setting of infection  CKD 3A -baseline creatinine 1.2-1.5, at baseline this morning  Hypomagnesemia -replenish magnesium as indicated and continue to monitor  A fib with RVR -rate controlled, continue Eliquis, Coreg.  Followed by cardiology as an outpatient  Chronic combined CHF -his EF has improved.  Hold Entresto in the setting of hypotension  Hyperlipidemia -continue statin  IDDM, poorly controlled, with hyperglycemia-continue glargine, sliding scale.  Add mealtime insulin today due to persistent hyperglycemia  CBG (last 3)  Recent Labs    07/13/22 1709 07/13/22 2119 07/14/22 0754  GLUCAP 385* 281* 323*     Lab Results  Component Value Date   HGBA1C 9.1 (H) 07/13/2022     Scheduled Meds:  apixaban  5 mg Oral BID   atorvastatin  80 mg Oral Daily   budesonide (PULMICORT) nebulizer solution  0.5 mg Nebulization BID   busPIRone  10 mg Oral BID   carvedilol  3.125 mg Oral BID   clopidogrel  75 mg Oral Q breakfast   digoxin  125 mcg Oral Daily   famotidine  20 mg Oral BID   insulin aspart  0-20 Units Subcutaneous TID WC   insulin aspart  7 Units Subcutaneous TID WC   insulin glargine-yfgn  40 Units Subcutaneous Daily   ipratropium-albuterol  3 mL Nebulization TID   lidocaine  1 patch Transdermal Daily   spironolactone  25 mg Oral Daily   Continuous Infusions:  vancomycin 1,750 mg (07/14/22 0230)  PRN Meds:.acetaminophen **OR** acetaminophen, HYDROmorphone (DILAUDID) injection, ondansetron **OR** ondansetron (ZOFRAN) IV, mouth rinse, oxyCODONE  Current Outpatient Medications  Medication Instructions   apixaban (ELIQUIS) 5 mg, Oral, 2 times daily   aspirin EC 81 mg, Oral, Daily, Swallow whole.   atorvastatin (LIPITOR) 80 mg, Oral, Daily   Basaglar KwikPen 40 Units,  Subcutaneous, Daily   busPIRone (BUSPAR) 10 mg, Oral, 2 times daily   carvedilol (COREG) 6.25 mg, Oral, 2 times daily   clopidogrel (PLAVIX) 75 mg, Oral, Daily with breakfast   digoxin (LANOXIN) 0.125 MG tablet Take 1 tablet (125 mcg total) by mouth once daily.   famotidine (PEPCID) 20 MG tablet Take 1 tablet (20 mg total) by mouth 2 times daily.   fluticasone (FLONASE) 50 MCG/ACT nasal spray 2 sprays, Each Nare, Daily   furosemide (LASIX) 40 mg, Oral, Daily   HumaLOG KwikPen 15 Units, Subcutaneous, 3 times daily   HYDROcodone-acetaminophen (NORCO/VICODIN) 5-325 MG tablet Take 1 tablet by mouth every 4 (four) hours as needed for up to 7 days for Moderate pain (4-6) or Severe pain (7-10).   sacubitril-valsartan (ENTRESTO) 49-51 MG 1 tablet, Oral, 2 times daily, Needs to see cardiology for refills   spironolactone (ALDACTONE) 25 mg, Oral, Daily    Diet Orders (From admission, onward)     Start     Ordered   07/15/22 0001  Diet NPO time specified Except for: Sips with Meds  Diet effective midnight       Comments: Patient to remain NPO until fully awake following the TEE.  Question:  Except for  Answer:  Clearance CootsSips with Meds   07/14/22 [id number]   07/12/22 1404  Diet heart healthy/carb modified Room service appropriate? Yes; Fluid consistency: Thin  Diet effective now       Question Answer Comment  Diet-HS Snack? Nothing   Room service appropriate? Yes   Fluid consistency: Thin      07/12/22 1405            DVT prophylaxis:  apixaban (ELIQUIS) tablet 5 mg   Lab Results  Component Value Date   PLT 270 07/14/2022      Code Status: Full Code  Family Communication: no family at bedside   Status is: Inpatient  Remains inpatient appropriate because: MRSA bacteremia  Level of care: Progressive  Consultants:  ID  Objective: Vitals:   07/13/22 2123 07/14/22 0617 07/14/22 0856 07/14/22 0900  BP: 111/73 122/75    Pulse: 74 82    Resp: 18 18 (!) 32 18  Temp: 98.2 F (36.8 C) 98.2  F (36.8 C)    TempSrc: Oral Oral    SpO2: 95% 97% 99%   Weight:      Height:        Intake/Output Summary (Last 24 hours) at 07/14/2022 1042 Last data filed at 07/14/2022 0620 Gross per 24 hour  Intake 460 ml  Output 1875 ml  Net -1415 ml    Wt Readings from Last 3 Encounters:  07/12/22 81 kg  07/09/22 81.6 kg  07/01/22 83.5 kg    Examination:  Constitutional: NAD Eyes: lids and conjunctivae normal, no scleral icterus ENMT: mmm Neck: normal, supple Respiratory: clear to auscultation bilaterally, no wheezing, no crackles. Normal respiratory effort.  Cardiovascular: Regular rate and rhythm, no murmurs / rubs / gallops. No LE edema. Abdomen: soft, no distention, no tenderness. Bowel sounds positive.  Skin: no rashes Neurologic: no focal deficits, equal strength    Data Reviewed: I have independently reviewed following labs  and imaging studies   CBC Recent Labs  Lab 07/09/22 1756 07/12/22 0850 07/13/22 0419 07/14/22 0525  WBC 13.9* 17.0* 19.1* 22.6*  HGB 13.9 15.2 12.3* 12.0*  HCT 40.0 44.9 36.7* 36.8*  PLT 180 248 205 270  MCV 92.2 92.4 94.3 95.6  MCH 32.0 31.3 31.6 31.2  MCHC 34.8 33.9 33.5 32.6  RDW 13.7 14.3 14.6 14.7  LYMPHSABS 0.4* 0.7  --   --   MONOABS 1.1* 1.3*  --   --   EOSABS 0.0 0.0  --   --   BASOSABS 0.1 0.1  --   --      Recent Labs  Lab 07/09/22 1756 07/12/22 0850 07/12/22 1050 07/12/22 1655 07/13/22 0410 07/13/22 0419 07/14/22 0525  NA 125* 126*  --  126*  --  124* 125*  K 3.9 4.8  --  5.0  --  4.8 4.7  CL 93* 90*  --  94*  --  93* 94*  CO2 20* 24  --  23  --  22 20*  GLUCOSE 170* 375*  --  422*  --  437* 335*  BUN 33* 35*  --  34*  --  35* 38*  CREATININE 1.38* 1.41*  --  1.27*  --  1.32* 1.24  CALCIUM 8.8* 9.3  --  8.6*  --  8.3* 8.5*  AST 44* 46*  --   --   --  20 27  ALT 38 52*  --   --   --  33 36  ALKPHOS 98 171*  --   --   --  117 114  BILITOT 1.6* 1.4*  --   --   --  1.0 0.9  ALBUMIN 3.1* 3.0*  --   --   --  2.3*  2.5*  MG  --   --   --   --   --   --  2.1  LATICACIDVEN 1.5 3.8* 2.2*  --   --   --   --   HGBA1C  --   --   --   --  9.1*  --   --   BNP  --  319.6*  --   --   --   --   --      ------------------------------------------------------------------------------------------------------------------ No results for input(s): "CHOL", "HDL", "LDLCALC", "TRIG", "CHOLHDL", "LDLDIRECT" in the last 72 hours.  Lab Results  Component Value Date   HGBA1C 9.1 (H) 07/13/2022   ------------------------------------------------------------------------------------------------------------------ No results for input(s): "TSH", "T4TOTAL", "T3FREE", "THYROIDAB" in the last 72 hours.  Invalid input(s): "FREET3"  Cardiac Enzymes No results for input(s): "CKMB", "TROPONINI", "MYOGLOBIN" in the last 168 hours.  Invalid input(s): "CK" ------------------------------------------------------------------------------------------------------------------    Component Value Date/Time   BNP 319.6 (H) 07/12/2022 0850    CBG: Recent Labs  Lab 07/13/22 0759 07/13/22 1156 07/13/22 1709 07/13/22 2119 07/14/22 0754  GLUCAP 396* 383* 385* 281* 323*     Recent Results (from the past 240 hour(s))  Resp panel by RT-PCR (RSV, Flu A&B, Covid) Anterior Nasal Swab     Status: None   Collection Time: 07/09/22  5:56 PM   Specimen: Anterior Nasal Swab  Result Value Ref Range Status   SARS Coronavirus 2 by RT PCR NEGATIVE NEGATIVE Final    Comment: (NOTE) SARS-CoV-2 target nucleic acids are NOT DETECTED.  The SARS-CoV-2 RNA is generally detectable in upper respiratory specimens during the acute phase of infection. The lowest concentration of SARS-CoV-2 viral copies this assay can detect is  138 copies/mL. A negative result does not preclude SARS-Cov-2 infection and should not be used as the sole basis for treatment or other patient management decisions. A negative result may occur with  improper specimen  collection/handling, submission of specimen other than nasopharyngeal swab, presence of viral mutation(s) within the areas targeted by this assay, and inadequate number of viral copies(<138 copies/mL). A negative result must be combined with clinical observations, patient history, and epidemiological information. The expected result is Negative.  Fact Sheet for Patients:  BloggerCourse.com  Fact Sheet for Healthcare Providers:  SeriousBroker.it  This test is no t yet approved or cleared by the [place] FDA and  has been authorized for detection and/or diagnosis of SARS-CoV-2 by FDA under an Emergency Use Authorization (EUA). This EUA will remain  in effect (meaning this test can be used) for the duration of the COVID-19 declaration under Section 564(b)(1) of the Act, 21 U.S.C.section 360bbb-3(b)(1), unless the authorization is terminated  or revoked sooner.       Influenza A by PCR NEGATIVE NEGATIVE Final   Influenza B by PCR NEGATIVE NEGATIVE Final    Comment: (NOTE) The Xpert Xpress SARS-CoV-2/FLU/RSV plus assay is intended as an aid in the diagnosis of influenza from Nasopharyngeal swab specimens and should not be used as a sole basis for treatment. Nasal washings and aspirates are unacceptable for Xpert Xpress SARS-CoV-2/FLU/RSV testing.  Fact Sheet for Patients: BloggerCourse.com  Fact Sheet for Healthcare Providers: SeriousBroker.it  This test is not yet approved or cleared by the [place] FDA and has been authorized for detection and/or diagnosis of SARS-CoV-2 by FDA under an Emergency Use Authorization (EUA). This EUA will remain in effect (meaning this test can be used) for the duration of the COVID-19 declaration under Section 564(b)(1) of the Act, 21 U.S.C. section 360bbb-3(b)(1), unless the authorization is terminated or revoked.     Resp Syncytial  Virus by PCR NEGATIVE NEGATIVE Final    Comment: (NOTE) Fact Sheet for Patients: BloggerCourse.com  Fact Sheet for Healthcare Providers: SeriousBroker.it  This test is not yet approved or cleared by the [place] FDA and has been authorized for detection and/or diagnosis of SARS-CoV-2 by FDA under an Emergency Use Authorization (EUA). This EUA will remain in effect (meaning this test can be used) for the duration of the COVID-19 declaration under Section 564(b)(1) of the Act, 21 U.S.C. section 360bbb-3(b)(1), unless the authorization is terminated or revoked.  Performed at [facility], [address] [place], [place] [address]   Blood culture (routine x 2)     Status: Abnormal   Collection Time: 07/12/22  8:48 AM   Specimen: BLOOD RIGHT ARM  Result Value Ref Range Status   Specimen Description   Final    BLOOD RIGHT ARM Performed at [facility], [address], [place], [place] [address]    Special Requests   Final    BOTTLES DRAWN AEROBIC AND ANAEROBIC Blood Culture results may not be optimal due to an inadequate volume of blood received in culture bottles Performed at [facility], [address] [place], [place] [address]    Culture  Setup Time   Final    GRAM POSITIVE COCCI IN CLUSTERS IN BOTH AEROBIC AND ANAEROBIC BOTTLES CRITICAL VALUE NOTED.  VALUE IS CONSISTENT WITH PREVIOUSLY REPORTED AND CALLED VALUE.    Culture (A)  Final    STAPHYLOCOCCUS AUREUS SUSCEPTIBILITIES PERFORMED ON PREVIOUS CULTURE WITHIN THE LAST 5 DAYS. Performed at [facility], [address], [place], [place] [address]    Report Status 07/14/2022 FINAL  Final  Blood culture (routine x 2)     Status: Abnormal (Preliminary result)   Collection Time: 07/12/22  8:50 AM   Specimen: BLOOD  Result Value Ref Range Status   Specimen Description   Final    BLOOD BLOOD LEFT ARM Performed at [facility], [address], [place], [place] [address]    Special Requests   Final    BOTTLES DRAWN AEROBIC AND ANAEROBIC Blood Culture results may not be optimal due to an inadequate volume of blood received in culture bottles Performed at [facility], [address], [place], [place] [address]    Culture  Setup Time   Final    GRAM POSITIVE COCCI IN CLUSTERS IN BOTH AEROBIC AND ANAEROBIC BOTTLES CRITICAL RESULT CALLED TO, READ BACK BY AND VERIFIED WITH: PHARMD M. Doran Durand 07/13/22 @ 0152 BY AB    Culture (A)  Final    METHICILLIN RESISTANT STAPHYLOCOCCUS AUREUS [facility] [place] FOR DAPTOMYCIN Performed at [facility], [address] [place], [place] [address]    Report Status PENDING  Incomplete   Organism ID, Bacteria METHICILLIN RESISTANT STAPHYLOCOCCUS AUREUS  Final      Susceptibility   Methicillin resistant staphylococcus aureus - MIC*    CIPROFLOXACIN >=8 RESISTANT Resistant     ERYTHROMYCIN >=8 RESISTANT Resistant     GENTAMICIN <=0.5 SENSITIVE Sensitive     OXACILLIN >=4 RESISTANT Resistant     TETRACYCLINE <=1 SENSITIVE Sensitive     VANCOMYCIN <=0.5 SENSITIVE Sensitive     TRIMETH/SULFA >=320 RESISTANT Resistant     CLINDAMYCIN <=0.25 SENSITIVE Sensitive     RIFAMPIN <=0.5 SENSITIVE Sensitive     Inducible Clindamycin NEGATIVE Sensitive     * METHICILLIN RESISTANT STAPHYLOCOCCUS AUREUS  Blood Culture ID Panel (Reflexed)     Status: Abnormal   Collection Time: 07/12/22  8:50 AM  Result Value Ref Range Status   Enterococcus faecalis NOT DETECTED NOT DETECTED Final   Enterococcus Faecium NOT DETECTED NOT DETECTED Final   Listeria monocytogenes NOT DETECTED NOT DETECTED Final   Staphylococcus species DETECTED (A) NOT DETECTED Final    Comment: CRITICAL RESULT CALLED TO, READ BACK BY AND VERIFIED WITH: PHARMD M. LILLISTON 07/13/22 @ 0152 BY AB    Staphylococcus aureus (BCID) DETECTED (A) NOT DETECTED Final    Comment:  Methicillin (oxacillin)-resistant Staphylococcus aureus (MRSA). MRSA is predictably resistant to beta-lactam antibiotics (except ceftaroline). Preferred therapy is vancomycin unless clinically contraindicated. Patient requires contact precautions if  hospitalized. CRITICAL RESULT CALLED TO, READ BACK BY AND VERIFIED WITH: PHARMD M. LILLISTON 07/13/22 @ 0152 BY AB    Staphylococcus epidermidis NOT DETECTED NOT DETECTED Final   Staphylococcus lugdunensis NOT DETECTED NOT DETECTED Final   Streptococcus species NOT DETECTED NOT DETECTED Final   Streptococcus agalactiae NOT DETECTED NOT DETECTED Final   Streptococcus pneumoniae NOT DETECTED NOT DETECTED Final   Streptococcus pyogenes NOT DETECTED NOT DETECTED Final   A.calcoaceticus-baumannii NOT DETECTED NOT DETECTED Final   Bacteroides fragilis NOT DETECTED NOT DETECTED Final   Enterobacterales NOT DETECTED NOT DETECTED Final   Enterobacter cloacae complex NOT DETECTED NOT DETECTED Final   Escherichia coli NOT DETECTED NOT DETECTED Final   Klebsiella aerogenes NOT DETECTED NOT DETECTED Final   Klebsiella oxytoca NOT DETECTED NOT DETECTED Final   Klebsiella pneumoniae NOT DETECTED NOT DETECTED Final   Proteus species NOT DETECTED NOT DETECTED Final  Salmonella species NOT DETECTED NOT DETECTED Final   Serratia marcescens NOT DETECTED NOT DETECTED Final   Haemophilus influenzae NOT DETECTED NOT DETECTED Final   Neisseria meningitidis NOT DETECTED NOT DETECTED Final   Pseudomonas aeruginosa NOT DETECTED NOT DETECTED Final   Stenotrophomonas maltophilia NOT DETECTED NOT DETECTED Final   Candida albicans NOT DETECTED NOT DETECTED Final   Candida auris NOT DETECTED NOT DETECTED Final   Candida glabrata NOT DETECTED NOT DETECTED Final   Candida krusei NOT DETECTED NOT DETECTED Final   Candida parapsilosis NOT DETECTED NOT DETECTED Final   Candida tropicalis NOT DETECTED NOT DETECTED Final   Cryptococcus neoformans/gattii NOT DETECTED NOT  DETECTED Final   Meth resistant mecA/C and MREJ DETECTED (A) NOT DETECTED Final    Comment: CRITICAL RESULT CALLED TO, READ BACK BY AND VERIFIED WITH: PHARMD M. LILLISTON 07/13/22 @ 0152 BY AB Performed at [facility], [address] [place], [place] [address]   SARS Coronavirus 2 by RT PCR (hospital order, [facility]) *cepheid single result test* Anterior Nasal Swab     Status: None   Collection Time: 07/12/22  9:24 AM   Specimen: Anterior Nasal Swab  Result Value Ref Range Status   SARS Coronavirus 2 by RT PCR NEGATIVE NEGATIVE Final    Comment: (NOTE) SARS-CoV-2 target nucleic acids are NOT DETECTED.  The SARS-CoV-2 RNA is generally detectable in upper and lower respiratory specimens during the acute phase of infection. The lowest concentration of SARS-CoV-2 viral copies this assay can detect is 250 copies / mL. A negative result does not preclude SARS-CoV-2 infection and should not be used as the sole basis for treatment or other patient management decisions.  A negative result may occur with improper specimen collection / handling, submission of specimen other than nasopharyngeal swab, presence of viral mutation(s) within the areas targeted by this assay, and inadequate number of viral copies (<250 copies / mL). A negative result must be combined with clinical observations, patient history, and epidemiological information.  Fact Sheet for Patients:   RoadLapTop.co.zahttps://www.fda.gov/media/158405/download  Fact Sheet for Healthcare Providers: http://kim-miller.com/https://www.fda.gov/media/158404/download  This test is not yet approved or  cleared by the [place] FDA and has been authorized for detection and/or diagnosis of SARS-CoV-2 by FDA under an Emergency Use Authorization (EUA).  This EUA will remain in effect (meaning this test can be used) for the duration of the COVID-19 declaration under Section 564(b)(1) of the Act, 21 U.S.C. section 360bbb-3(b)(1), unless the  authorization is terminated or revoked sooner.  Performed at [facility], [address] [place], [place] [address]   Respiratory (~20 pathogens) panel by PCR     Status: None   Collection Time: 07/12/22  2:03 PM   Specimen: Nasopharyngeal Swab; Respiratory  Result Value Ref Range Status   Adenovirus NOT DETECTED NOT DETECTED Final   Coronavirus 229E NOT DETECTED NOT DETECTED Final    Comment: (NOTE) The Coronavirus on the Respiratory Panel, DOES NOT test for the novel  Coronavirus (2019 nCoV)    Coronavirus HKU1 NOT DETECTED NOT DETECTED Final   Coronavirus NL63 NOT DETECTED NOT DETECTED Final   Coronavirus OC43 NOT DETECTED NOT DETECTED Final   Metapneumovirus NOT DETECTED NOT DETECTED Final   Rhinovirus / Enterovirus NOT DETECTED NOT DETECTED Final   Influenza A NOT DETECTED NOT DETECTED Final   Influenza B NOT DETECTED NOT DETECTED Final   Parainfluenza Virus 1 NOT DETECTED NOT DETECTED Final   Parainfluenza Virus 2  NOT DETECTED NOT DETECTED Final   Parainfluenza Virus 3 NOT DETECTED NOT DETECTED Final   Parainfluenza Virus 4 NOT DETECTED NOT DETECTED Final   Respiratory Syncytial Virus NOT DETECTED NOT DETECTED Final   Bordetella pertussis NOT DETECTED NOT DETECTED Final   Bordetella Parapertussis NOT DETECTED NOT DETECTED Final   Chlamydophila pneumoniae NOT DETECTED NOT DETECTED Final   Mycoplasma pneumoniae NOT DETECTED NOT DETECTED Final    Comment: Performed at [facility], [address], [place], [place] [address]     Radiology Studies: MR Lumbar Spine W Wo Contrast  Result Date: 07/14/2022 CLINICAL DATA:  Initial evaluation for MRSA bacteremia, back pain, concern for infection. EXAM: MRI LUMBAR SPINE WITHOUT AND WITH CONTRAST TECHNIQUE: Multiplanar and multiecho pulse sequences of the lumbar spine were obtained without and with intravenous contrast. CONTRAST:  8mL GADAVIST GADOBUTROL 1 MMOL/ML IV SOLN COMPARISON:  None Available.  FINDINGS: Segmentation: Standard. Lowest well-formed disc space labeled the L5-S1 level. Alignment: Mild levoscoliosis. Alignment otherwise normal with preservation of the normal lumbar lordosis. No significant listhesis. Vertebrae: Mild chronic compression deformity noted at the superior endplate of L4 without retropulsion. Vertebral body height otherwise maintained with no other acute or recent fracture. Bone marrow signal intensity heterogeneous with a few scattered benign hemangiomata. No worrisome osseous lesions. No evidence for osteomyelitis discitis or septic arthritis. Conus medullaris and cauda equina: Conus extends to the T12 level. Conus and cauda equina appear normal. No epidural abscess or other collections. No abnormal enhancement. Paraspinal and other soft tissues: Mild diffuse edema seen throughout the lower posterior paraspinous musculature (series 6, images 14, 6). Finding is nonspecific. No loculated or drainable fluid collections. Paraspinous soft tissues demonstrate no other acute finding. Disc levels: L1-2:  Unremarkable. L2-3: Disc desiccation without significant disc bulge. Mild facet spurring. No canal or foraminal stenosis. L3-4: Disc desiccation with mild diffuse disc bulge. Superimposed small biforaminal to extraforaminal disc protrusions closely approximate the exiting left L3 nerve roots. Mild bilateral facet hypertrophy. Resultant mild canal with right worse than left lateral recess stenosis. Mild bilateral L3 foraminal narrowing. L4-5: Disc desiccation with mild disc bulge. Mild bilateral facet hypertrophy with associated trace joint effusions. No significant spinal stenosis. Mild bilateral L4 foraminal narrowing. L5-S1: Degenerative intervertebral disc space narrowing with disc desiccation and diffuse disc bulge. Reactive endplate spurring. Superimposed left subarticular disc protrusion with slight inferior migration. Protruding disc contacts the descending left S1 nerve root as it  courses of the left lateral recess (series 8, image 32). Resultant mild narrowing of the left lateral recess. Central canal remains patent. Mild left L5 foraminal narrowing. IMPRESSION: 1. Mild diffuse edema involving the lower posterior paraspinous musculature. Finding is nonspecific, and could be related to overall volume status. Changes of acute myositis, which could be either infectious or inflammatory in nature could also be considered. No loculated or drainable fluid collections. 2. No other MRI evidence for acute infection within the lumbar spine. No evidence for osteomyelitis discitis or septic arthritis. 3. Small left subarticular disc protrusion at L5-S1, contacting the descending left S1 nerve root. 4. Degenerative spondylosis at L3-4 and L4-5 with resultant mild bilateral L3 and L4 foraminal stenosis. Electronically Signed   By: Rise Mu M.D.   On: 07/14/2022 04:34   MR THORACIC SPINE W WO CONTRAST  Result Date: 07/14/2022 CLINICAL DATA:  Initial evaluation for MRSA bacteremia, concern for infection. EXAM: MRI THORACIC WITHOUT AND WITH CONTRAST TECHNIQUE: Multiplanar and multiecho pulse sequences of the thoracic spine were obtained without  and with intravenous contrast. CONTRAST:  36mL GADAVIST GADOBUTROL 1 MMOL/ML IV SOLN COMPARISON:  None Available. FINDINGS: Alignment: Mild dextroscoliosis with exaggeration of the normal thoracic kyphosis. No listhesis. Vertebrae: Mild chronic compression deformities noted about the T2 through T6 as well as the T9 vertebral bodies. No significant retropulsion. No acute or recent fracture. Bone marrow signal intensity within normal limits. Atypical hemangioma noted largely replacing the T8 vertebral body. No worrisome osseous lesions. No evidence for osteomyelitis discitis or septic arthritis. Cord: Normal signal and morphology. No epidural abscess or other collection. No abnormal enhancement. Paraspinal and other soft tissues: No paraspinous  inflammatory changes or collections. Small layering bilateral pleural effusions noted. Disc levels: T2-3: Degenerative disc bulging flattens and partially effaces the ventral thecal sac. No significant spinal stenosis. Foramina remain patent. T7-8: Tiny central disc protrusion indents the ventral thecal sac. Associated annular fissure. No significant canal or foraminal stenosis. T11-12: Small central disc protrusion with slight superior migration. No significant spinal stenosis. Foramina remain patent. Otherwise, minor for age spondylosis seen elsewhere within the thoracic spine. No other significant disc bulge or focal disc herniation. No significant spinal stenosis. Foramina remain patent. IMPRESSION: 1. No MRI evidence for acute infection within the thoracic spine. 2. Small layering bilateral pleural effusions. 3. Mild chronic compression deformities about the T2 through T6 as well as the T9 vertebral bodies. 4. Underlying mild for age thoracic spondylosis without significant stenosis or neural impingement. Electronically Signed   By: Rise Mu M.D.   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 There is no acute fracture. No focal osseous lesion or cortical erosion. Soft tissues are within normal limits. Plantar calcaneal spur is present. 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 No radiographic evidence of osteomyelitis. Electronically Signed   By: Darliss Cheney M.D.   On: 07/13/2022 17:52     Pamella Pert, MD, PhD [facility]  Between 7 am - 7 pm I am available, please contact me via Amion (for emergencies) or Securechat (non urgent  messages)  Between 7 pm - 7 am I am not available, please contact night coverage MD/APP via Amion

## 2022-07-14 NOTE — Progress Notes (Signed)
[facility]   Reason for visit: Follow up on bacteremia  Interval History: MRI without concerns for infection, xray foot with no concerns.  His main complaint is beeping noise.   Day 3 antibiotics  Physical Exam: Constitutional:  Vitals:   07/14/22 1100 07/14/22 1400  BP:  117/86  Pulse:  83  Resp: 20 20  Temp:  98.2 F (36.8 C)  SpO2:  99%   patient appears in NAD Respiratory: Normal respiratory effort  Review of Systems: Constitutional: negative for fevers and chills  Lab Results  Component Value Date   WBC 22.6 (H) 07/14/2022   HGB 12.0 (L) 07/14/2022   HCT 36.8 (L) 07/14/2022   MCV 95.6 07/14/2022   PLT 270 07/14/2022    Lab Results  Component Value Date   CREATININE 1.24 07/14/2022   BUN 38 (H) 07/14/2022   NA 125 (L) 07/14/2022   K 4.7 07/14/2022   CL 94 (L) 07/14/2022   CO2 20 (L) 07/14/2022    Lab Results  Component Value Date   ALT 36 07/14/2022   AST 27 07/14/2022   ALKPHOS 114 07/14/2022     Microbiology: Recent Results (from the past 240 hour(s))  Resp panel by RT-PCR (RSV, Flu A&B, Covid) Anterior Nasal Swab     Status: None   Collection Time: 07/09/22  5:56 PM   Specimen: Anterior Nasal Swab  Result Value Ref Range Status   SARS Coronavirus 2 by RT PCR NEGATIVE NEGATIVE Final    Comment: (NOTE) SARS-CoV-2 target nucleic acids are NOT DETECTED.  The SARS-CoV-2 RNA is generally detectable in upper respiratory specimens during the acute phase of infection. The lowest concentration of SARS-CoV-2 viral copies this assay can detect is 138 copies/mL. A negative result does not preclude SARS-Cov-2 infection and should not be used as the sole basis for treatment or other patient management decisions. A negative result may occur with  improper specimen collection/handling, submission of specimen other than nasopharyngeal swab, presence of viral mutation(s) within the areas targeted by this assay, and inadequate  number of viral copies(<138 copies/mL). A negative result must be combined with clinical observations, patient history, and epidemiological information. The expected result is Negative.  Fact Sheet for Patients:  BloggerCourse.com  Fact Sheet for Healthcare Providers:  SeriousBroker.it  This test is no t yet approved or cleared by the [place] FDA and  has been authorized for detection and/or diagnosis of SARS-CoV-2 by FDA under an Emergency Use Authorization (EUA). This EUA will remain  in effect (meaning this test can be used) for the duration of the COVID-19 declaration under Section 564(b)(1) of the Act, 21 U.S.C.section 360bbb-3(b)(1), unless the authorization is terminated  or revoked sooner.       Influenza A by PCR NEGATIVE NEGATIVE Final   Influenza B by PCR NEGATIVE NEGATIVE Final    Comment: (NOTE) The Xpert Xpress SARS-CoV-2/FLU/RSV plus assay is intended as an aid in the diagnosis of influenza from Nasopharyngeal swab specimens and should not be used as a sole basis for treatment. Nasal washings and aspirates are unacceptable for Xpert Xpress SARS-CoV-2/FLU/RSV testing.  Fact Sheet for Patients: BloggerCourse.com  Fact Sheet for Healthcare Providers: SeriousBroker.it  This test is not yet approved or cleared by the [place] FDA and has been authorized for detection and/or diagnosis of SARS-CoV-2 by FDA under an Emergency Use Authorization (EUA). This EUA will remain in effect (meaning this test can be used) for the duration of the COVID-19  declaration under Section 564(b)(1) of the Act, 21 U.S.C. section 360bbb-3(b)(1), unless the authorization is terminated or revoked.     Resp Syncytial Virus by PCR NEGATIVE NEGATIVE Final    Comment: (NOTE) Fact Sheet for Patients: BloggerCourse.comhttps://www.fda.gov/media/152166/download  Fact Sheet for Healthcare  Providers: SeriousBroker.ithttps://www.fda.gov/media/152162/download  This test is not yet approved or cleared by the [place] FDA and has been authorized for detection and/or diagnosis of SARS-CoV-2 by FDA under an Emergency Use Authorization (EUA). This EUA will remain in effect (meaning this test can be used) for the duration of the COVID-19 declaration under Section 564(b)(1) of the Act, 21 U.S.C. section 360bbb-3(b)(1), unless the authorization is terminated or revoked.  Performed at [facility], [address] [place], [place] [address]   Blood culture (routine x 2)     Status: Abnormal   Collection Time: 07/12/22  8:48 AM   Specimen: BLOOD RIGHT ARM  Result Value Ref Range Status   Specimen Description   Final    BLOOD RIGHT ARM Performed at [facility], [address] [place], [place] [address]    Special Requests   Final    BOTTLES DRAWN AEROBIC AND ANAEROBIC Blood Culture results may not be optimal due to an inadequate volume of blood received in culture bottles Performed at [facility], [address] [place], [place] [address]    Culture  Setup Time   Final    GRAM POSITIVE COCCI IN CLUSTERS IN BOTH AEROBIC AND ANAEROBIC BOTTLES CRITICAL VALUE NOTED.  VALUE IS CONSISTENT WITH PREVIOUSLY REPORTED AND CALLED VALUE.    Culture (A)  Final    STAPHYLOCOCCUS AUREUS SUSCEPTIBILITIES PERFORMED ON PREVIOUS CULTURE WITHIN THE LAST 5 DAYS. Performed at [facility], [address] [place], [place] [address]    Report Status 07/14/2022 FINAL  Final  Blood culture (routine x 2)     Status: Abnormal (Preliminary result)   Collection Time: 07/12/22  8:50 AM   Specimen: BLOOD  Result Value Ref Range Status   Specimen Description   Final    BLOOD BLOOD LEFT ARM Performed at [facility], [address] [place], [place] [address]    Special Requests   Final    BOTTLES DRAWN AEROBIC AND ANAEROBIC Blood Culture  results may not be optimal due to an inadequate volume of blood received in culture bottles Performed at [facility], [address] [place], [place] [address]    Culture  Setup Time   Final    GRAM POSITIVE COCCI IN CLUSTERS IN BOTH AEROBIC AND ANAEROBIC BOTTLES CRITICAL RESULT CALLED TO, READ BACK BY AND VERIFIED WITH: PHARMD M. Doran DurandLILLISTON 07/13/22 @ 0152 BY AB    Culture (A)  Final    METHICILLIN RESISTANT STAPHYLOCOCCUS AUREUS [facility] FOR DAPTOMYCIN Performed at [facility], [address] [place], [place] [address]    Report Status PENDING  Incomplete   Organism ID, Bacteria METHICILLIN RESISTANT STAPHYLOCOCCUS AUREUS  Final      Susceptibility   Methicillin resistant staphylococcus aureus - MIC*    CIPROFLOXACIN >=8 RESISTANT Resistant     ERYTHROMYCIN >=8 RESISTANT Resistant     GENTAMICIN <=0.5 SENSITIVE Sensitive     OXACILLIN >=4 RESISTANT Resistant     TETRACYCLINE <=1 SENSITIVE Sensitive     VANCOMYCIN <=0.5 SENSITIVE Sensitive     TRIMETH/SULFA >=320 RESISTANT Resistant     CLINDAMYCIN <=0.25 SENSITIVE Sensitive     RIFAMPIN <=0.5 SENSITIVE  Sensitive     Inducible Clindamycin NEGATIVE Sensitive     * METHICILLIN RESISTANT STAPHYLOCOCCUS AUREUS  Blood Culture ID Panel (Reflexed)     Status: Abnormal   Collection Time: 07/12/22  8:50 AM  Result Value Ref Range Status   Enterococcus faecalis NOT DETECTED NOT DETECTED Final   Enterococcus Faecium NOT DETECTED NOT DETECTED Final   Listeria monocytogenes NOT DETECTED NOT DETECTED Final   Staphylococcus species DETECTED (A) NOT DETECTED Final    Comment: CRITICAL RESULT CALLED TO, READ BACK BY AND VERIFIED WITH: PHARMD M. LILLISTON 07/13/22 @ 0152 BY AB    Staphylococcus aureus (BCID) DETECTED (A) NOT DETECTED Final    Comment: Methicillin (oxacillin)-resistant Staphylococcus aureus (MRSA). MRSA is predictably resistant to beta-lactam antibiotics (except ceftaroline). Preferred  therapy is vancomycin unless clinically contraindicated. Patient requires contact precautions if  hospitalized. CRITICAL RESULT CALLED TO, READ BACK BY AND VERIFIED WITH: PHARMD M. LILLISTON 07/13/22 @ 0152 BY AB    Staphylococcus epidermidis NOT DETECTED NOT DETECTED Final   Staphylococcus lugdunensis NOT DETECTED NOT DETECTED Final   Streptococcus species NOT DETECTED NOT DETECTED Final   Streptococcus agalactiae NOT DETECTED NOT DETECTED Final   Streptococcus pneumoniae NOT DETECTED NOT DETECTED Final   Streptococcus pyogenes NOT DETECTED NOT DETECTED Final   A.calcoaceticus-baumannii NOT DETECTED NOT DETECTED Final   Bacteroides fragilis NOT DETECTED NOT DETECTED Final   Enterobacterales NOT DETECTED NOT DETECTED Final   Enterobacter cloacae complex NOT DETECTED NOT DETECTED Final   Escherichia coli NOT DETECTED NOT DETECTED Final   Klebsiella aerogenes NOT DETECTED NOT DETECTED Final   Klebsiella oxytoca NOT DETECTED NOT DETECTED Final   Klebsiella pneumoniae NOT DETECTED NOT DETECTED Final   Proteus species NOT DETECTED NOT DETECTED Final   Salmonella species NOT DETECTED NOT DETECTED Final   Serratia marcescens NOT DETECTED NOT DETECTED Final   Haemophilus influenzae NOT DETECTED NOT DETECTED Final   Neisseria meningitidis NOT DETECTED NOT DETECTED Final   Pseudomonas aeruginosa NOT DETECTED NOT DETECTED Final   Stenotrophomonas maltophilia NOT DETECTED NOT DETECTED Final   Candida albicans NOT DETECTED NOT DETECTED Final   Candida auris NOT DETECTED NOT DETECTED Final   Candida glabrata NOT DETECTED NOT DETECTED Final   Candida krusei NOT DETECTED NOT DETECTED Final   Candida parapsilosis NOT DETECTED NOT DETECTED Final   Candida tropicalis NOT DETECTED NOT DETECTED Final   Cryptococcus neoformans/gattii NOT DETECTED NOT DETECTED Final   Meth resistant mecA/C and MREJ DETECTED (A) NOT DETECTED Final    Comment: CRITICAL RESULT CALLED TO, READ BACK BY AND VERIFIED  WITH: PHARMD M. LILLISTON 07/13/22 @ 0152 BY AB Performed at [facility], [address], [place], [place] [address]   SARS Coronavirus 2 by RT PCR (hospital order, performed in [facility] hospital lab) *cepheid single result test* Anterior Nasal Swab     Status: None   Collection Time: 07/12/22  9:24 AM   Specimen: Anterior Nasal Swab  Result Value Ref Range Status   SARS Coronavirus 2 by RT PCR NEGATIVE NEGATIVE Final    Comment: (NOTE) SARS-CoV-2 target nucleic acids are NOT DETECTED.  The SARS-CoV-2 RNA is generally detectable in upper and lower respiratory specimens during the acute phase of infection. The lowest concentration of SARS-CoV-2 viral copies this assay can detect is 250 copies / mL. A negative result does not preclude SARS-CoV-2 infection and should not be used as the sole basis for treatment or other patient management decisions.  A negative result may  occur with improper specimen collection / handling, submission of specimen other than nasopharyngeal swab, presence of viral mutation(s) within the areas targeted by this assay, and inadequate number of viral copies (<250 copies / mL). A negative result must be combined with clinical observations, patient history, and epidemiological information.  Fact Sheet for Patients:   RoadLapTop.co.za  Fact Sheet for Healthcare Providers: http://kim-miller.com/  This test is not yet approved or  cleared by the [place] FDA and has been authorized for detection and/or diagnosis of SARS-CoV-2 by FDA under an Emergency Use Authorization (EUA).  This EUA will remain in effect (meaning this test can be used) for the duration of the COVID-19 declaration under Section 564(b)(1) of the Act, 21 U.S.C. section 360bbb-3(b)(1), unless the authorization is terminated or revoked sooner.  Performed at [facility], [address] [place], [place] [address]    Respiratory (~20 pathogens) panel by PCR     Status: None   Collection Time: 07/12/22  2:03 PM   Specimen: Nasopharyngeal Swab; Respiratory  Result Value Ref Range Status   Adenovirus NOT DETECTED NOT DETECTED Final   Coronavirus 229E NOT DETECTED NOT DETECTED Final    Comment: (NOTE) The Coronavirus on the Respiratory Panel, DOES NOT test for the novel  Coronavirus (2019 nCoV)    Coronavirus HKU1 NOT DETECTED NOT DETECTED Final   Coronavirus NL63 NOT DETECTED NOT DETECTED Final   Coronavirus OC43 NOT DETECTED NOT DETECTED Final   Metapneumovirus NOT DETECTED NOT DETECTED Final   Rhinovirus / Enterovirus NOT DETECTED NOT DETECTED Final   Influenza A NOT DETECTED NOT DETECTED Final   Influenza B NOT DETECTED NOT DETECTED Final   Parainfluenza Virus 1 NOT DETECTED NOT DETECTED Final   Parainfluenza Virus 2 NOT DETECTED NOT DETECTED Final   Parainfluenza Virus 3 NOT DETECTED NOT DETECTED Final   Parainfluenza Virus 4 NOT DETECTED NOT DETECTED Final   Respiratory Syncytial Virus NOT DETECTED NOT DETECTED Final   Bordetella pertussis NOT DETECTED NOT DETECTED Final   Bordetella Parapertussis NOT DETECTED NOT DETECTED Final   Chlamydophila pneumoniae NOT DETECTED NOT DETECTED Final   Mycoplasma pneumoniae NOT DETECTED NOT DETECTED Final    Comment: Performed at [facility], [address], [place], [place] [address]    Impression/Plan:  1. MRSA bacteremia - repeat blood cultures sent.  No source noted with negative MRI for discitis, osteomyelitis.  Xray of foot without concerns.  TEE planned for tomorrow.  Conitnue with vancomycin.   2.  Back pain - MRI reassuring.  Continue with supportive care.  3.  Right foot ulcer - no signs of infection.  Continue wound care at discharge.

## 2022-07-14 NOTE — Progress Notes (Addendum)
   [facility] has been requested to perform a transesophageal echocardiogram on David [name] for bacteremia.  After careful review of history and examination, the risks and benefits of transesophageal echocardiogram have been explained including risks of esophageal damage, perforation (1:10,000 risk), bleeding, pharyngeal hematoma as well as other potential complications associated with conscious sedation including aspiration, arrhythmia, respiratory failure and death. Alternatives to treatment were discussed, questions were answered. Patient is willing to proceed. This has been scheduled for 1pm with Dr. Royann Shivers tomorrow at [facility]. Pre-TEE orders written including NPO after midnight. The patient is noted to be on a dual insulin regimen - since we are otherwise not involved in this patient's care, I sent a message to Dr. Elvera Lennox to please review regimen and adjust since he will be NPO tomorrow.  It is also noted the patient had a DCCV previously scheduled 5/22 (prior to this hospitalization) but f/u appontment is not until 5/24. I rescheduled his clinic f/u to 5/16 and put the appt on AVS so that finalized plans can be reviewed. Patient aware.  Laurann Montana, PA-C  07/14/2022 9:10 AM

## 2022-07-14 NOTE — H&P (View-Only) (Signed)
PROGRESS NOTE  David [name] MRN:[id number] DOB: 02/15/1961 DOA: 07/12/2022 PCP: Newlin, Enobong, MD   LOS: 2 days   Brief Narrative / Interim history: 61 y.o. 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 His appetite has been decreased. He had a fever at home of 105 F. He was having significant chills and rigors.  His rigors were so intense that produced the above-mentioned back pain.   Subjective / 24h Interval events: He is complaining of severe mid back pain.  Also left hand numbness  Assesement and Plan: Principal Problem:   Acute respiratory failure with hypoxia Active Problems:   Essential hypertension   Insulin dependent type 2 diabetes mellitus   Atrial fibrillation with RVR   Sleep apnea suspected   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)   Pure hypercholesterolemia   Hyponatremia   Hypomagnesemia   Sepsis due to pneumonia  Principal problem Sepsis due to MRSA bacteremia -blood cultures obtained on admission showing MRSA.  He has a history of MRSA bacteremia in 2023 felt to be due to diabetic toe infections.  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 Duda. -Back pain is worrisome, he reports like he pulled a muscle in the setting of severe chills.  MRI of the T and L-spine with nonspecific findings, no osteomyelitis or discitis.  2D echo without vegetations, TEE scheduled for tomorrow  Active problems Right foot ulcer -at the site of prior ray amputation.  Patient tells me that it is improving.  Plain imaging without  osteomyelitis  Hyponatremia -hold Lasix, he is also hyperglycemic suggesting a degree of pseudohyponatremia  Essential hypertension -hold Entresto, Coreg decreased due to soft blood pressures in the setting of infection  CKD 3A -baseline creatinine 1.2-1.5, at baseline this morning  Hypomagnesemia -replenish magnesium as indicated and continue to monitor  A fib with RVR -rate controlled, continue Eliquis, Coreg.  Followed by cardiology as an outpatient  Chronic combined CHF -his EF has improved.  Hold Entresto in the setting of hypotension  Hyperlipidemia -continue statin  IDDM, poorly controlled, with hyperglycemia-continue glargine, sliding scale.  Add mealtime insulin today due to persistent hyperglycemia  CBG (last 3)  Recent Labs    07/13/22 1709 07/13/22 2119 07/14/22 0754  GLUCAP 385* 281* 323*     Lab Results  Component Value Date   HGBA1C 9.1 (H) 07/13/2022     Scheduled Meds:  apixaban  5 mg Oral BID   atorvastatin  80 mg Oral Daily   budesonide (PULMICORT) nebulizer solution  0.5 mg Nebulization BID   busPIRone  10 mg Oral BID   carvedilol  3.125 mg Oral BID   clopidogrel  75 mg Oral Q breakfast   digoxin  125 mcg Oral Daily   famotidine  20 mg Oral BID   insulin aspart  0-20 Units Subcutaneous TID WC   insulin aspart  7 Units Subcutaneous TID WC   insulin glargine-yfgn  40 Units Subcutaneous Daily   ipratropium-albuterol  3 mL Nebulization TID   lidocaine  1 patch Transdermal Daily   spironolactone  25 mg Oral Daily   Continuous Infusions:  vancomycin 1,750 mg (07/14/22 0230)     PRN Meds:.acetaminophen **OR** acetaminophen, HYDROmorphone (DILAUDID) injection, ondansetron **OR** ondansetron (ZOFRAN) IV, mouth rinse, oxyCODONE  Current Outpatient Medications  Medication Instructions   apixaban (ELIQUIS) 5 mg, Oral, 2 times daily   aspirin EC 81 mg, Oral, Daily, Swallow whole.   atorvastatin (LIPITOR) 80 mg, Oral, Daily   Basaglar KwikPen 40 Units,  Subcutaneous, Daily   busPIRone (BUSPAR) 10 mg, Oral, 2 times daily   carvedilol (COREG) 6.25 mg, Oral, 2 times daily   clopidogrel (PLAVIX) 75 mg, Oral, Daily with breakfast   digoxin (LANOXIN) 0.125 MG tablet Take 1 tablet (125 mcg total) by mouth once daily.   famotidine (PEPCID) 20 MG tablet Take 1 tablet (20 mg total) by mouth 2 times daily.   fluticasone (FLONASE) 50 MCG/ACT nasal spray 2 sprays, Each Nare, Daily   furosemide (LASIX) 40 mg, Oral, Daily   HumaLOG KwikPen 15 Units, Subcutaneous, 3 times daily   HYDROcodone-acetaminophen (NORCO/VICODIN) 5-325 MG tablet Take 1 tablet by mouth every 4 (four) hours as needed for up to 7 days for Moderate pain (4-6) or Severe pain (7-10).   sacubitril-valsartan (ENTRESTO) 49-51 MG 1 tablet, Oral, 2 times daily, Needs to see cardiology for refills   spironolactone (ALDACTONE) 25 mg, Oral, Daily    Diet Orders (From admission, onward)     Start     Ordered   07/15/22 0001  Diet NPO time specified Except for: Sips with Meds  Diet effective midnight       Comments: Patient to remain NPO until fully awake following the TEE.  Question:  Except for  Answer:  Sips with Meds   07/14/22 0910   07/12/22 1404  Diet heart healthy/carb modified Room service appropriate? Yes; Fluid consistency: Thin  Diet effective now       Question Answer Comment  Diet-HS Snack? Nothing   Room service appropriate? Yes   Fluid consistency: Thin      07/12/22 1405            DVT prophylaxis:  apixaban (ELIQUIS) tablet 5 mg   Lab Results  Component Value Date   PLT 270 07/14/2022      Code Status: Full Code  Family Communication: no family at bedside   Status is: Inpatient  Remains inpatient appropriate because: MRSA bacteremia  Level of care: Progressive  Consultants:  ID  Objective: Vitals:   07/13/22 2123 07/14/22 0617 07/14/22 0856 07/14/22 0900  BP: 111/73 122/75    Pulse: 74 82    Resp: 18 18 (!) 32 18  Temp: 98.2 F (36.8 C) 98.2  F (36.8 C)    TempSrc: Oral Oral    SpO2: 95% 97% 99%   Weight:      Height:        Intake/Output Summary (Last 24 hours) at 07/14/2022 1042 Last data filed at 07/14/2022 0620 Gross per 24 hour  Intake 460 ml  Output 1875 ml  Net -1415 ml    Wt Readings from Last 3 Encounters:  07/12/22 81 kg  07/09/22 81.6 kg  07/01/22 83.5 kg    Examination:  Constitutional: NAD Eyes: lids and conjunctivae normal, no scleral icterus ENMT: mmm Neck: normal, supple Respiratory: clear to auscultation bilaterally, no wheezing, no crackles. Normal respiratory effort.  Cardiovascular: Regular rate and rhythm, no murmurs / rubs / gallops. No LE edema. Abdomen: soft, no distention, no tenderness. Bowel sounds positive.  Skin: no rashes Neurologic: no focal deficits, equal strength    Data Reviewed: I have independently reviewed following labs   and imaging studies   CBC Recent Labs  Lab 07/09/22 1756 07/12/22 0850 07/13/22 0419 07/14/22 0525  WBC 13.9* 17.0* 19.1* 22.6*  HGB 13.9 15.2 12.3* 12.0*  HCT 40.0 44.9 36.7* 36.8*  PLT 180 248 205 270  MCV 92.2 92.4 94.3 95.6  MCH 32.0 31.3 31.6 31.2  MCHC 34.8 33.9 33.5 32.6  RDW 13.7 14.3 14.6 14.7  LYMPHSABS 0.4* 0.7  --   --   MONOABS 1.1* 1.3*  --   --   EOSABS 0.0 0.0  --   --   BASOSABS 0.1 0.1  --   --      Recent Labs  Lab 07/09/22 1756 07/12/22 0850 07/12/22 1050 07/12/22 1655 07/13/22 0410 07/13/22 0419 07/14/22 0525  NA 125* 126*  --  126*  --  124* 125*  K 3.9 4.8  --  5.0  --  4.8 4.7  CL 93* 90*  --  94*  --  93* 94*  CO2 20* 24  --  23  --  22 20*  GLUCOSE 170* 375*  --  422*  --  437* 335*  BUN 33* 35*  --  34*  --  35* 38*  CREATININE 1.38* 1.41*  --  1.27*  --  1.32* 1.24  CALCIUM 8.8* 9.3  --  8.6*  --  8.3* 8.5*  AST 44* 46*  --   --   --  20 27  ALT 38 52*  --   --   --  33 36  ALKPHOS 98 171*  --   --   --  117 114  BILITOT 1.6* 1.4*  --   --   --  1.0 0.9  ALBUMIN 3.1* 3.0*  --   --   --  2.3*  2.5*  MG  --   --   --   --   --   --  2.1  LATICACIDVEN 1.5 3.8* 2.2*  --   --   --   --   HGBA1C  --   --   --   --  9.1*  --   --   BNP  --  319.6*  --   --   --   --   --      ------------------------------------------------------------------------------------------------------------------ No results for input(s): "CHOL", "HDL", "LDLCALC", "TRIG", "CHOLHDL", "LDLDIRECT" in the last 72 hours.  Lab Results  Component Value Date   HGBA1C 9.1 (H) 07/13/2022   ------------------------------------------------------------------------------------------------------------------ No results for input(s): "TSH", "T4TOTAL", "T3FREE", "THYROIDAB" in the last 72 hours.  Invalid input(s): "FREET3"  Cardiac Enzymes No results for input(s): "CKMB", "TROPONINI", "MYOGLOBIN" in the last 168 hours.  Invalid input(s): "CK" ------------------------------------------------------------------------------------------------------------------    Component Value Date/Time   BNP 319.6 (H) 07/12/2022 0850    CBG: Recent Labs  Lab 07/13/22 0759 07/13/22 1156 07/13/22 1709 07/13/22 2119 07/14/22 0754  GLUCAP 396* 383* 385* 281* 323*     Recent Results (from the past 240 hour(s))  Resp panel by RT-PCR (RSV, Flu A&B, Covid) Anterior Nasal Swab     Status: None   Collection Time: 07/09/22  5:56 PM   Specimen: Anterior Nasal Swab  Result Value Ref Range Status   SARS Coronavirus 2 by RT PCR NEGATIVE NEGATIVE Final    Comment: (NOTE) SARS-CoV-2 target nucleic acids are NOT DETECTED.  The SARS-CoV-2 RNA is generally detectable in upper respiratory specimens during the acute phase of infection. The lowest concentration of SARS-CoV-2 viral copies this assay can detect is   138 copies/mL. A negative result does not preclude SARS-Cov-2 infection and should not be used as the sole basis for treatment or other patient management decisions. A negative result may occur with  improper specimen  collection/handling, submission of specimen other than nasopharyngeal swab, presence of viral mutation(s) within the areas targeted by this assay, and inadequate number of viral copies(<138 copies/mL). A negative result must be combined with clinical observations, patient history, and epidemiological information. The expected result is Negative.  Fact Sheet for Patients:  https://www.fda.gov/media/152166/download  Fact Sheet for Healthcare Providers:  https://www.fda.gov/media/152162/download  This test is no t yet approved or cleared by the [place] FDA and  has been authorized for detection and/or diagnosis of SARS-CoV-2 by FDA under an Emergency Use Authorization (EUA). This EUA will remain  in effect (meaning this test can be used) for the duration of the COVID-19 declaration under Section 564(b)(1) of the Act, 21 U.S.C.section 360bbb-3(b)(1), unless the authorization is terminated  or revoked sooner.       Influenza A by PCR NEGATIVE NEGATIVE Final   Influenza B by PCR NEGATIVE NEGATIVE Final    Comment: (NOTE) The Xpert Xpress SARS-CoV-2/FLU/RSV plus assay is intended as an aid in the diagnosis of influenza from Nasopharyngeal swab specimens and should not be used as a sole basis for treatment. Nasal washings and aspirates are unacceptable for Xpert Xpress SARS-CoV-2/FLU/RSV testing.  Fact Sheet for Patients: https://www.fda.gov/media/152166/download  Fact Sheet for Healthcare Providers: https://www.fda.gov/media/152162/download  This test is not yet approved or cleared by the [place] FDA and has been authorized for detection and/or diagnosis of SARS-CoV-2 by FDA under an Emergency Use Authorization (EUA). This EUA will remain in effect (meaning this test can be used) for the duration of the COVID-19 declaration under Section 564(b)(1) of the Act, 21 U.S.C. section 360bbb-3(b)(1), unless the authorization is terminated or revoked.     Resp Syncytial  Virus by PCR NEGATIVE NEGATIVE Final    Comment: (NOTE) Fact Sheet for Patients: https://www.fda.gov/media/152166/download  Fact Sheet for Healthcare Providers: https://www.fda.gov/media/152162/download  This test is not yet approved or cleared by the [place] FDA and has been authorized for detection and/or diagnosis of SARS-CoV-2 by FDA under an Emergency Use Authorization (EUA). This EUA will remain in effect (meaning this test can be used) for the duration of the COVID-19 declaration under Section 564(b)(1) of the Act, 21 U.S.C. section 360bbb-3(b)(1), unless the authorization is terminated or revoked.  Performed at [facility], [address] [place], [place] [address]   Blood culture (routine x 2)     Status: Abnormal   Collection Time: 07/12/22  8:48 AM   Specimen: BLOOD RIGHT ARM  Result Value Ref Range Status   Specimen Description   Final    BLOOD RIGHT ARM Performed at [facility], [address] [place], [place] [address]    Special Requests   Final    BOTTLES DRAWN AEROBIC AND ANAEROBIC Blood Culture results may not be optimal due to an inadequate volume of blood received in culture bottles Performed at [facility], [address] [place],  [address]    Culture  Setup Time   Final    GRAM POSITIVE COCCI IN CLUSTERS IN BOTH AEROBIC AND ANAEROBIC BOTTLES CRITICAL VALUE NOTED.  VALUE IS CONSISTENT WITH PREVIOUSLY REPORTED AND CALLED VALUE.    Culture (A)  Final    STAPHYLOCOCCUS AUREUS SUSCEPTIBILITIES PERFORMED ON PREVIOUS CULTURE WITHIN THE LAST 5 DAYS. Performed at [facility], [address] [place], [place] [address]    Report Status 07/14/2022 FINAL  Final  Blood culture (routine x 2)     Status: Abnormal (Preliminary result)   Collection Time: 07/12/22  8:50 AM   Specimen: BLOOD  Result Value Ref Range Status   Specimen Description   Final    BLOOD BLOOD LEFT ARM Performed at [facility], [address] [place], [place] [address]    Special Requests   Final    BOTTLES DRAWN AEROBIC AND ANAEROBIC Blood Culture results may not be optimal due to an inadequate volume of blood received in culture bottles Performed at [facility], [address] [place], [place] [address]    Culture  Setup Time   Final    GRAM POSITIVE COCCI IN CLUSTERS IN BOTH AEROBIC AND ANAEROBIC BOTTLES CRITICAL RESULT CALLED TO, READ BACK BY AND VERIFIED WITH: PHARMD M. LILLISTON 07/13/22 @ 0152 BY AB    Culture (A)  Final    METHICILLIN RESISTANT STAPHYLOCOCCUS AUREUS [facility] [place] FOR DAPTOMYCIN Performed at [facility], [address] [place], [place] [address]    Report Status PENDING  Incomplete   Organism ID, Bacteria METHICILLIN RESISTANT STAPHYLOCOCCUS AUREUS  Final      Susceptibility   Methicillin resistant staphylococcus aureus - MIC*    CIPROFLOXACIN >=8 RESISTANT Resistant     ERYTHROMYCIN >=8 RESISTANT Resistant     GENTAMICIN <=0.5 SENSITIVE Sensitive     OXACILLIN >=4 RESISTANT Resistant     TETRACYCLINE <=1 SENSITIVE Sensitive     VANCOMYCIN <=0.5 SENSITIVE Sensitive     TRIMETH/SULFA >=320 RESISTANT Resistant     CLINDAMYCIN <=0.25 SENSITIVE Sensitive     RIFAMPIN <=0.5 SENSITIVE Sensitive     Inducible Clindamycin NEGATIVE Sensitive     * METHICILLIN RESISTANT STAPHYLOCOCCUS AUREUS  Blood Culture ID Panel (Reflexed)     Status: Abnormal   Collection Time: 07/12/22  8:50 AM  Result Value Ref Range Status   Enterococcus faecalis NOT DETECTED NOT DETECTED Final   Enterococcus Faecium NOT DETECTED NOT DETECTED Final   Listeria monocytogenes NOT DETECTED NOT DETECTED Final   Staphylococcus species DETECTED (A) NOT DETECTED Final    Comment: CRITICAL RESULT CALLED TO, READ BACK BY AND VERIFIED WITH: PHARMD M. LILLISTON 07/13/22 @ 0152 BY AB    Staphylococcus aureus (BCID) DETECTED (A) NOT DETECTED Final    Comment:  Methicillin (oxacillin)-resistant Staphylococcus aureus (MRSA). MRSA is predictably resistant to beta-lactam antibiotics (except ceftaroline). Preferred therapy is vancomycin unless clinically contraindicated. Patient requires contact precautions if  hospitalized. CRITICAL RESULT CALLED TO, READ BACK BY AND VERIFIED WITH: PHARMD M. LILLISTON 07/13/22 @ 0152 BY AB    Staphylococcus epidermidis NOT DETECTED NOT DETECTED Final   Staphylococcus lugdunensis NOT DETECTED NOT DETECTED Final   Streptococcus species NOT DETECTED NOT DETECTED Final   Streptococcus agalactiae NOT DETECTED NOT DETECTED Final   Streptococcus pneumoniae NOT DETECTED NOT DETECTED Final   Streptococcus pyogenes NOT DETECTED NOT DETECTED Final   A.calcoaceticus-baumannii NOT DETECTED NOT DETECTED Final   Bacteroides fragilis NOT DETECTED NOT DETECTED Final   Enterobacterales NOT DETECTED NOT DETECTED Final   Enterobacter cloacae complex NOT DETECTED NOT DETECTED Final   Escherichia coli NOT DETECTED NOT DETECTED Final   Klebsiella aerogenes NOT DETECTED NOT DETECTED Final   Klebsiella oxytoca NOT DETECTED NOT DETECTED Final   Klebsiella pneumoniae NOT DETECTED NOT DETECTED Final   Proteus species NOT DETECTED NOT DETECTED Final     Salmonella species NOT DETECTED NOT DETECTED Final   Serratia marcescens NOT DETECTED NOT DETECTED Final   Haemophilus influenzae NOT DETECTED NOT DETECTED Final   Neisseria meningitidis NOT DETECTED NOT DETECTED Final   Pseudomonas aeruginosa NOT DETECTED NOT DETECTED Final   Stenotrophomonas maltophilia NOT DETECTED NOT DETECTED Final   Candida albicans NOT DETECTED NOT DETECTED Final   Candida auris NOT DETECTED NOT DETECTED Final   Candida glabrata NOT DETECTED NOT DETECTED Final   Candida krusei NOT DETECTED NOT DETECTED Final   Candida parapsilosis NOT DETECTED NOT DETECTED Final   Candida tropicalis NOT DETECTED NOT DETECTED Final   Cryptococcus neoformans/gattii NOT DETECTED NOT  DETECTED Final   Meth resistant mecA/C and MREJ DETECTED (A) NOT DETECTED Final    Comment: CRITICAL RESULT CALLED TO, READ BACK BY AND VERIFIED WITH: PHARMD M. LILLISTON 07/13/22 @ 0152 BY AB Performed at [facility], [address] [place], [place] [address]   SARS Coronavirus 2 by RT PCR (hospital order, performed in [place] hospital lab) *cepheid single result test* Anterior Nasal Swab     Status: None   Collection Time: 07/12/22  9:24 AM   Specimen: Anterior Nasal Swab  Result Value Ref Range Status   SARS Coronavirus 2 by RT PCR NEGATIVE NEGATIVE Final    Comment: (NOTE) SARS-CoV-2 target nucleic acids are NOT DETECTED.  The SARS-CoV-2 RNA is generally detectable in upper and lower respiratory specimens during the acute phase of infection. The lowest concentration of SARS-CoV-2 viral copies this assay can detect is 250 copies / mL. A negative result does not preclude SARS-CoV-2 infection and should not be used as the sole basis for treatment or other patient management decisions.  A negative result may occur with improper specimen collection / handling, submission of specimen other than nasopharyngeal swab, presence of viral mutation(s) within the areas targeted by this assay, and inadequate number of viral copies (<250 copies / mL). A negative result must be combined with clinical observations, patient history, and epidemiological information.  Fact Sheet for Patients:   https://www.fda.gov/media/158405/download  Fact Sheet for Healthcare Providers: https://www.fda.gov/media/158404/download  This test is not yet approved or  cleared by the [place] FDA and has been authorized for detection and/or diagnosis of SARS-CoV-2 by FDA under an Emergency Use Authorization (EUA).  This EUA will remain in effect (meaning this test can be used) for the duration of the COVID-19 declaration under Section 564(b)(1) of the Act, 21 U.S.C. section 360bbb-3(b)(1), unless the  authorization is terminated or revoked sooner.  Performed at [facility], [address] [place], [place] [address]   Respiratory (~20 pathogens) panel by PCR     Status: None   Collection Time: 07/12/22  2:03 PM   Specimen: Nasopharyngeal Swab; Respiratory  Result Value Ref Range Status   Adenovirus NOT DETECTED NOT DETECTED Final   Coronavirus 229E NOT DETECTED NOT DETECTED Final    Comment: (NOTE) The Coronavirus on the Respiratory Panel, DOES NOT test for the novel  Coronavirus (2019 nCoV)    Coronavirus HKU1 NOT DETECTED NOT DETECTED Final   Coronavirus NL63 NOT DETECTED NOT DETECTED Final   Coronavirus OC43 NOT DETECTED NOT DETECTED Final   Metapneumovirus NOT DETECTED NOT DETECTED Final   Rhinovirus / Enterovirus NOT DETECTED NOT DETECTED Final   Influenza A NOT DETECTED NOT DETECTED Final   Influenza B NOT DETECTED NOT DETECTED Final   Parainfluenza Virus 1 NOT DETECTED NOT DETECTED Final   Parainfluenza Virus 2   NOT DETECTED NOT DETECTED Final   Parainfluenza Virus 3 NOT DETECTED NOT DETECTED Final   Parainfluenza Virus 4 NOT DETECTED NOT DETECTED Final   Respiratory Syncytial Virus NOT DETECTED NOT DETECTED Final   Bordetella pertussis NOT DETECTED NOT DETECTED Final   Bordetella Parapertussis NOT DETECTED NOT DETECTED Final   Chlamydophila pneumoniae NOT DETECTED NOT DETECTED Final   Mycoplasma pneumoniae NOT DETECTED NOT DETECTED Final    Comment: Performed at [facility], [address] [place], [place] [address]     Radiology Studies: MR Lumbar Spine W Wo Contrast  Result Date: 07/14/2022 CLINICAL DATA:  Initial evaluation for MRSA bacteremia, back pain, concern for infection. EXAM: MRI LUMBAR SPINE WITHOUT AND WITH CONTRAST TECHNIQUE: Multiplanar and multiecho pulse sequences of the lumbar spine were obtained without and with intravenous contrast. CONTRAST:  8mL GADAVIST GADOBUTROL 1 MMOL/ML IV SOLN COMPARISON:  None Available.  FINDINGS: Segmentation: Standard. Lowest well-formed disc space labeled the L5-S1 level. Alignment: Mild levoscoliosis. Alignment otherwise normal with preservation of the normal lumbar lordosis. No significant listhesis. Vertebrae: Mild chronic compression deformity noted at the superior endplate of L4 without retropulsion. Vertebral body height otherwise maintained with no other acute or recent fracture. Bone marrow signal intensity heterogeneous with a few scattered benign hemangiomata. No worrisome osseous lesions. No evidence for osteomyelitis discitis or septic arthritis. Conus medullaris and cauda equina: Conus extends to the T12 level. Conus and cauda equina appear normal. No epidural abscess or other collections. No abnormal enhancement. Paraspinal and other soft tissues: Mild diffuse edema seen throughout the lower posterior paraspinous musculature (series 6, images 14, 6). Finding is nonspecific. No loculated or drainable fluid collections. Paraspinous soft tissues demonstrate no other acute finding. Disc levels: L1-2:  Unremarkable. L2-3: Disc desiccation without significant disc bulge. Mild facet spurring. No canal or foraminal stenosis. L3-4: Disc desiccation with mild diffuse disc bulge. Superimposed small biforaminal to extraforaminal disc protrusions closely approximate the exiting left L3 nerve roots. Mild bilateral facet hypertrophy. Resultant mild canal with right worse than left lateral recess stenosis. Mild bilateral L3 foraminal narrowing. L4-5: Disc desiccation with mild disc bulge. Mild bilateral facet hypertrophy with associated trace joint effusions. No significant spinal stenosis. Mild bilateral L4 foraminal narrowing. L5-S1: Degenerative intervertebral disc space narrowing with disc desiccation and diffuse disc bulge. Reactive endplate spurring. Superimposed left subarticular disc protrusion with slight inferior migration. Protruding disc contacts the descending left S1 nerve root as it  courses of the left lateral recess (series 8, image 32). Resultant mild narrowing of the left lateral recess. Central canal remains patent. Mild left L5 foraminal narrowing. IMPRESSION: 1. Mild diffuse edema involving the lower posterior paraspinous musculature. Finding is nonspecific, and could be related to overall volume status. Changes of acute myositis, which could be either infectious or inflammatory in nature could also be considered. No loculated or drainable fluid collections. 2. No other MRI evidence for acute infection within the lumbar spine. No evidence for osteomyelitis discitis or septic arthritis. 3. Small left subarticular disc protrusion at L5-S1, contacting the descending left S1 nerve root. 4. Degenerative spondylosis at L3-4 and L4-5 with resultant mild bilateral L3 and L4 foraminal stenosis. Electronically Signed   By: Benjamin  McClintock M.D.   On: 07/14/2022 04:34   MR THORACIC SPINE W WO CONTRAST  Result Date: 07/14/2022 CLINICAL DATA:  Initial evaluation for MRSA bacteremia, concern for infection. EXAM: MRI THORACIC WITHOUT AND WITH CONTRAST TECHNIQUE: Multiplanar and multiecho pulse sequences of the thoracic spine were obtained without   and with intravenous contrast. CONTRAST:  8mL GADAVIST GADOBUTROL 1 MMOL/ML IV SOLN COMPARISON:  None Available. FINDINGS: Alignment: Mild dextroscoliosis with exaggeration of the normal thoracic kyphosis. No listhesis. Vertebrae: Mild chronic compression deformities noted about the T2 through T6 as well as the T9 vertebral bodies. No significant retropulsion. No acute or recent fracture. Bone marrow signal intensity within normal limits. Atypical hemangioma noted largely replacing the T8 vertebral body. No worrisome osseous lesions. No evidence for osteomyelitis discitis or septic arthritis. Cord: Normal signal and morphology. No epidural abscess or other collection. No abnormal enhancement. Paraspinal and other soft tissues: No paraspinous  inflammatory changes or collections. Small layering bilateral pleural effusions noted. Disc levels: T2-3: Degenerative disc bulging flattens and partially effaces the ventral thecal sac. No significant spinal stenosis. Foramina remain patent. T7-8: Tiny central disc protrusion indents the ventral thecal sac. Associated annular fissure. No significant canal or foraminal stenosis. T11-12: Small central disc protrusion with slight superior migration. No significant spinal stenosis. Foramina remain patent. Otherwise, minor for age spondylosis seen elsewhere within the thoracic spine. No other significant disc bulge or focal disc herniation. No significant spinal stenosis. Foramina remain patent. IMPRESSION: 1. No MRI evidence for acute infection within the thoracic spine. 2. Small layering bilateral pleural effusions. 3. Mild chronic compression deformities about the T2 through T6 as well as the T9 vertebral bodies. 4. Underlying mild for age thoracic spondylosis without significant stenosis or neural impingement. Electronically Signed   By: Benjamin  McClintock M.D.   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 There is no acute fracture. No focal osseous lesion or cortical erosion. Soft tissues are within normal limits. Plantar calcaneal spur is present. 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 No radiographic evidence of osteomyelitis. Electronically Signed   By: Amy  Guttmann M.D.   On: 07/13/2022 17:52     Jaxden Blyden, MD, PhD [facility]  Between 7 am - 7 pm I am available, please contact me via Amion (for emergencies) or Securechat (non urgent  messages)  Between 7 pm - 7 am I am not available, please contact night coverage MD/APP via Amion  

## 2022-07-14 NOTE — Inpatient Diabetes Management (Signed)
Inpatient Diabetes Program Recommendations  AACE/ADA: New Consensus Statement on Inpatient Glycemic Control (2015)  Target Ranges:  Prepandial:   less than 140 mg/dL      Peak postprandial:   less than 180 mg/dL (1-2 hours)      Critically ill patients:  140 - 180 mg/dL   Lab Results  Component Value Date   GLUCAP 323 (H) 07/14/2022   HGBA1C 9.1 (H) 07/13/2022    Review of Glycemic Control  Diabetes history: DM2 Outpatient Diabetes medications: Basaglar 40 units QD, Humalog 15 TID Current orders for Inpatient glycemic control: Semglee 40 QD, Novolog 0-20 TID with meals + 7 units TID  HgbA1C - 9.1%  Inpatient Diabetes Program Recommendations:    Consider increasing Semglee to 44 units QD  Consider increasing Novolog to 12 units TID with meals if eating > 50%  Will be NPO after MN for TEE on 4/12  Modify Novolog to 0-20 Q4H at MN  Give 1/2 Semglee dose on 4/12 (22 units QD as 1x dose)  Will attempt to see pt at bedside this afternoon to discuss HgbA1C and his diabetes control at home.  Continue to follow.  Thank you. Ailene Ards, RD, LDN, CDCES Inpatient Diabetes Coordinator [phone]

## 2022-07-15 ENCOUNTER — Encounter (HOSPITAL_COMMUNITY)
Admission: EM | Disposition: A | Discharge status: Home Health | Payer: Self-pay | Source: Home / Self Care | Attending: Internal Medicine

## 2022-07-15 ENCOUNTER — Inpatient Hospital Stay (HOSPITAL_COMMUNITY): Payer: Medicaid Other

## 2022-07-15 ENCOUNTER — Inpatient Hospital Stay (HOSPITAL_COMMUNITY): Payer: Medicaid Other | Admitting: Certified Registered"

## 2022-07-15 ENCOUNTER — Other Ambulatory Visit (HOSPITAL_COMMUNITY): Payer: Self-pay

## 2022-07-15 DIAGNOSIS — I10 Essential (primary) hypertension: Secondary | ICD-10-CM

## 2022-07-15 DIAGNOSIS — G4730 Sleep apnea, unspecified: Secondary | ICD-10-CM

## 2022-07-15 DIAGNOSIS — R7881 Bacteremia: Secondary | ICD-10-CM

## 2022-07-15 DIAGNOSIS — Z87891 Personal history of nicotine dependence: Secondary | ICD-10-CM

## 2022-07-15 DIAGNOSIS — B9562 Methicillin resistant Staphylococcus aureus infection as the cause of diseases classified elsewhere: Secondary | ICD-10-CM

## 2022-07-15 HISTORY — PX: TEE WITHOUT CARDIOVERSION: SHX5443

## 2022-07-15 LAB — CBC
HCT: 40.6 % (ref 39.0–52.0)
Hemoglobin: 13.5 g/dL (ref 13.0–17.0)
MCH: 31.4 pg (ref 26.0–34.0)
MCHC: 33.3 g/dL (ref 30.0–36.0)
MCV: 94.4 fL (ref 80.0–100.0)
Platelets: 302 10*3/uL (ref 150–400)
RBC: 4.3 MIL/uL (ref 4.22–5.81)
RDW: 14.7 % (ref 11.5–15.5)
WBC: 18 10*3/uL — ABNORMAL HIGH (ref 4.0–10.5)
nRBC: 0 % (ref 0.0–0.2)

## 2022-07-15 LAB — BASIC METABOLIC PANEL
Anion gap: 9 (ref 5–15)
BUN: 30 mg/dL — ABNORMAL HIGH (ref 6–20)
CO2: 24 mmol/L (ref 22–32)
Calcium: 9 mg/dL (ref 8.9–10.3)
Chloride: 92 mmol/L — ABNORMAL LOW (ref 98–111)
Creatinine, Ser: 1.23 mg/dL (ref 0.61–1.24)
GFR, Estimated: >60 mL/min (ref >60)
Glucose, Bld: 160 mg/dL — ABNORMAL HIGH (ref 70–99)
Potassium: 4.9 mmol/L (ref 3.5–5.1)
Sodium: 125 mmol/L — ABNORMAL LOW (ref 135–145)

## 2022-07-15 LAB — GLUCOSE, CAPILLARY
Glucose-Capillary: 147 mg/dL — ABNORMAL HIGH (ref 70–99)
Glucose-Capillary: 109 mg/dL — ABNORMAL HIGH (ref 70–99)
Glucose-Capillary: 122 mg/dL — ABNORMAL HIGH (ref 70–99)
Glucose-Capillary: 150 mg/dL — ABNORMAL HIGH (ref 70–99)

## 2022-07-15 LAB — MISC LABCORP TEST (SEND OUT): Labcorp test code: 96388

## 2022-07-15 LAB — ECHO TEE

## 2022-07-15 LAB — CULTURE, BLOOD (ROUTINE X 2): Culture: NO GROWTH

## 2022-07-15 SURGERY — ECHOCARDIOGRAM, TRANSESOPHAGEAL
Anesthesia: Monitor Anesthesia Care

## 2022-07-15 MED ORDER — SODIUM CHLORIDE 0.9 % IV BOLUS
500.0000 mL | Freq: Once | INTRAVENOUS | Status: AC
  Administered 2022-07-15: 500 mL via INTRAVENOUS

## 2022-07-15 MED ORDER — MUSCLE RUB 10-15 % EX CREA
TOPICAL_CREAM | CUTANEOUS | Status: DC | PRN
  Administered 2022-07-22: 1 via TOPICAL
  Filled 2022-07-15 (×2): qty 85

## 2022-07-15 MED ORDER — PHENYLEPHRINE 80 MCG/ML (10ML) SYRINGE FOR IV PUSH (FOR BLOOD PRESSURE SUPPORT)
PREFILLED_SYRINGE | INTRAVENOUS | Status: DC | PRN
  Administered 2022-07-15: 80 ug via INTRAVENOUS

## 2022-07-15 MED ORDER — HYDROMORPHONE HCL 1 MG/ML IJ SOLN
1.0000 mg | INTRAMUSCULAR | Status: DC | PRN
  Administered 2022-07-15 – 2022-07-21 (×10): 1 mg via INTRAVENOUS
  Filled 2022-07-15 (×10): qty 1

## 2022-07-15 MED ORDER — INSULIN GLARGINE-YFGN 100 UNIT/ML ~~LOC~~ SOLN
20.0000 [IU] | Freq: Every day | SUBCUTANEOUS | Status: DC
  Administered 2022-07-15 – 2022-07-30 (×15): 20 [IU] via SUBCUTANEOUS
  Filled 2022-07-15 (×16): qty 0.2

## 2022-07-15 MED ORDER — SODIUM CHLORIDE 0.9 % IV SOLN
8.0000 mg/kg | Freq: Every day | INTRAVENOUS | Status: DC
  Administered 2022-07-15 – 2022-07-17 (×3): 650 mg via INTRAVENOUS
  Filled 2022-07-15 (×5): qty 13

## 2022-07-15 MED ORDER — OXYCODONE HCL 5 MG PO TABS
5.0000 mg | ORAL_TABLET | Freq: Once | ORAL | Status: AC
  Administered 2022-07-15: 5 mg via ORAL
  Filled 2022-07-15: qty 1

## 2022-07-15 MED ORDER — SODIUM CHLORIDE 0.9 % IV SOLN: INTRAVENOUS | Status: DC | PRN

## 2022-07-15 MED ORDER — SODIUM CHLORIDE 0.9 % IV SOLN
8.0000 mg/kg | Freq: Every day | INTRAVENOUS | Status: DC
  Filled 2022-07-15: qty 13

## 2022-07-15 MED ORDER — OXYCODONE HCL 5 MG PO TABS
10.0000 mg | ORAL_TABLET | ORAL | Status: DC | PRN
  Administered 2022-07-15 (×2): 15 mg via ORAL
  Filled 2022-07-15 (×4): qty 3

## 2022-07-15 MED ORDER — PROPOFOL 500 MG/50ML IV EMUL
INTRAVENOUS | Status: DC | PRN
  Administered 2022-07-15: 125 ug/kg/min via INTRAVENOUS

## 2022-07-15 NOTE — Progress Notes (Signed)
PHARMACY CONSULT NOTE FOR:  OUTPATIENT  PARENTERAL ANTIBIOTIC THERAPY (OPAT)  Indication: MRSA bacteremia Regimen: daptomycin 650 mg IV q24h  End date: 07/28/22  IV antibiotic discharge orders are pended. To discharging provider:  please sign these orders via discharge navigator,  Select New Orders & click on the button choice - Manage This Unsigned Work.     Thank you for allowing pharmacy to be a part of this patient's care.  Larena Sox, PharmD PGY1 Pharmacy Resident   07/15/2022  2:16 PM

## 2022-07-15 NOTE — Anesthesia Postprocedure Evaluation (Signed)
Anesthesia Post Note  Patient: Clifton Getto  Procedure(s) Performed: TRANSESOPHAGEAL ECHOCARDIOGRAM     Patient location during evaluation: PACU Anesthesia Type: MAC Level of consciousness: awake and alert Pain management: pain level controlled Vital Signs Assessment: post-procedure vital signs reviewed and stable Respiratory status: spontaneous breathing, nonlabored ventilation, respiratory function stable and patient connected to nasal cannula oxygen Cardiovascular status: stable and blood pressure returned to baseline Postop Assessment: no apparent nausea or vomiting Anesthetic complications: no  No notable events documented.  Last Vitals:  Vitals:   07/15/22 1238 07/15/22 1319  BP: (!) 116/92 130/84  Pulse: 84 79  Resp: 20 18  Temp:  36.5 C  SpO2: 96%     Last Pain:  Vitals:   07/15/22 1319  TempSrc: Oral  PainSc:                  Trevor Iha

## 2022-07-15 NOTE — Inpatient Diabetes Management (Signed)
Inpatient Diabetes Program Recommendations  AACE/ADA: New Consensus Statement on Inpatient Glycemic Control (2015)  Target Ranges:  Prepandial:   less than 140 mg/dL      Peak postprandial:   less than 180 mg/dL (1-2 hours)      Critically ill patients:  140 - 180 mg/dL   Lab Results  Component Value Date   GLUCAP 147 (H) 07/15/2022   HGBA1C 9.1 (H) 07/13/2022    Review of Glycemic Control   Current orders for Inpatient glycemic control: Semglee 20 QD, Novolog 0-20 TID  Inpatient Diabetes Program Recommendations:    Saw pt at bedside late yesterday afternoon to discuss his diabetes control and HgbA1C of 9.1%. Pt was very upset and anxious, said he did not want to talk about his diabetes, and he politely asked me to leave.   CBGs acceptable this am.   Will watch trends.   Thank you. Ailene Ards, RD, LDN, CDCES Inpatient Diabetes Coordinator [phone]

## 2022-07-15 NOTE — Progress Notes (Signed)
Patient transported to [facility] for TEE via [facility].

## 2022-07-15 NOTE — Anesthesia Preprocedure Evaluation (Signed)
Anesthesia Evaluation  Patient identified by MRN, date of birth, ID band Patient awake    Reviewed: Allergy & Precautions, H&P , NPO status , Patient's Chart, lab work & pertinent test results  Airway Mallampati: II  TM Distance: >3 FB Neck ROM: Full    Dental no notable dental hx. (+) Teeth Intact, Dental Advisory Given   Pulmonary neg pulmonary ROS, sleep apnea , former smoker   Pulmonary exam normal breath sounds clear to auscultation       Cardiovascular hypertension, Pt. on medications negative cardio ROS Normal cardiovascular exam Rhythm:Regular Rate:Normal     Neuro/Psych negative neurological ROS  negative psych ROS   GI/Hepatic negative GI ROS, Neg liver ROS,,,  Endo/Other  negative endocrine ROSdiabetes    Renal/GU negative Renal ROS  negative genitourinary   Musculoskeletal negative musculoskeletal ROS (+)    Abdominal   Peds negative pediatric ROS (+)  Hematology negative hematology ROS (+)   Anesthesia Other Findings   Reproductive/Obstetrics negative OB ROS                             Anesthesia Physical Anesthesia Plan  ASA: 3  Anesthesia Plan: MAC   Post-op Pain Management: Minimal or no pain anticipated   Induction: Intravenous  PONV Risk Score and Plan: Propofol infusion and TIVA  Airway Management Planned: Nasal Cannula and Natural Airway  Additional Equipment: None  Intra-op Plan:   Post-operative Plan:   Informed Consent: I have reviewed the patients History and Physical, chart, labs and discussed the procedure including the risks, benefits and alternatives for the proposed anesthesia with the patient or authorized representative who has indicated his/her understanding and acceptance.     Dental advisory given  Plan Discussed with: CRNA, Anesthesiologist and Surgeon  Anesthesia Plan Comments:        Anesthesia Quick Evaluation

## 2022-07-15 NOTE — Progress Notes (Signed)
  Echocardiogram Echocardiogram Transesophageal has been performed.  David [name] 07/15/2022, 12:16 PM

## 2022-07-15 NOTE — Op Note (Signed)
INDICATIONS: MRSA bacteremia  PROCEDURE:   Informed consent was obtained prior to the procedure. The risks, benefits and alternatives for the procedure were discussed and the patient comprehended these risks.  Risks include, but are not limited to, cough, sore throat, vomiting, nausea, somnolence, esophageal and stomach trauma or perforation, bleeding, low blood pressure, aspiration, pneumonia, infection, trauma to the teeth and death.    After a procedural time-out, the oropharynx was anesthetized with 20% benzocaine spray.   During this procedure the patient was administered IV propofol by Anesthesiology, Dr. Richardson Landry.  The transesophageal probe was inserted in the esophagus and stomach without difficulty and multiple views were obtained.  The patient was kept under observation until the patient left the procedure room.  The patient left the procedure room in stable condition.   Agitated microbubble saline contrast was not administered.  COMPLICATIONS:    There were no immediate complications.  FINDINGS:  No vegetations or any other signs of endocarditis are seen.  RECOMMENDATIONS:    Identify other source of bacteremia  Time Spent Directly with the Patient:  30 minutes   Arcangel Minion 07/15/2022, 11:56 AM

## 2022-07-15 NOTE — Anesthesia Procedure Notes (Signed)
Procedure Name: MAC Date/Time: 07/15/2022 11:52 AM  Performed by: Dorie Rank, CRNAPre-anesthesia Checklist: Patient identified, Emergency Drugs available, Suction available, Patient being monitored and Timeout performed Patient Re-evaluated:Patient Re-evaluated prior to induction Oxygen Delivery Method: Nasal cannula Preoxygenation: Pre-oxygenation with 100% oxygen Induction Type: IV induction Placement Confirmation: breath sounds checked- equal and bilateral and positive ETCO2 Dental Injury: Teeth and Oropharynx as per pre-operative assessment

## 2022-07-15 NOTE — Transfer of Care (Signed)
Immediate Anesthesia Transfer of Care Note  Patient: David [name]  Procedure(s) Performed: TRANSESOPHAGEAL ECHOCARDIOGRAM  Patient Location: Cath Lab  Anesthesia Type:MAC  Level of Consciousness: sedated  Airway & Oxygen Therapy: Patient connected to nasal cannula oxygen  Post-op Assessment: Post -op Vital signs reviewed and stable  Post vital signs: stable  Last Vitals:  Vitals Value Taken Time  BP 87/55 07/15/22 1156  Temp 37.2 C 07/15/22 1156  Pulse 93 07/15/22 1156  Resp 18 07/15/22 1156  SpO2 91 % 07/15/22 1156    Last Pain:  Vitals:   07/15/22 1156  TempSrc: Temporal  PainSc: Asleep      Patients Stated Pain Goal: 8 (07/15/22 0752)  Complications: No notable events documented.

## 2022-07-15 NOTE — Plan of Care (Signed)
Now on room air sating 95% at rest.  Problem: Respiratory: Goal: Ability to maintain a clear airway will improve Outcome: Progressing

## 2022-07-15 NOTE — Interval H&P Note (Signed)
History and Physical Interval Note:  07/15/2022 9:02 AM  David [name]  has presented today for surgery, with the diagnosis of BACTEREMIA.  The various methods of treatment have been discussed with the patient and family. After consideration of risks, benefits and other options for treatment, the patient has consented to  Procedure(s): TRANSESOPHAGEAL ECHOCARDIOGRAM (N/A) as a surgical intervention.  The patient's history has been reviewed, patient examined, no change in status, stable for surgery.  I have reviewed the patient's chart and labs.  Questions were answered to the patient's satisfaction.     [name]

## 2022-07-15 NOTE — Progress Notes (Signed)
PROGRESS NOTE  David [name] ZOX:[id number] DOB: [birth year] DOA: 07/12/2022 PCP: [name], MD   LOS: 3 days   Brief Narrative / Interim history: 62 y.o. 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 His appetite has been decreased. He had a fever at home of 105 F. He was having significant chills and rigors.  His rigors were so intense that produced the above-mentioned back pain.   Subjective / 24h Interval events: Continues to have severe back pain.  Assesement and Plan: Principal Problem:   Acute respiratory failure with hypoxia Active Problems:   Essential hypertension   Insulin dependent type 2 diabetes mellitus   Atrial fibrillation with RVR   Sleep apnea suspected   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)   Pure hypercholesterolemia   Hyponatremia   Hypomagnesemia   Sepsis due to pneumonia  Principal problem Sepsis due to MRSA bacteremia -blood cultures obtained on admission showing MRSA.  He has a history of MRSA bacteremia in 2023 felt to be due to diabetic toe infections.  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 [name]. -Back pain is worrisome, he reports like he pulled a muscle in the setting of severe chills.  MRI of the T and L-spine with nonspecific findings, no osteomyelitis or discitis.  2D echo without vegetations, TEE scheduled for tomorrow  Active problems Right foot ulcer -at the site of prior ray amputation.  Patient tells me that it is improving.  Plain imaging without osteomyelitis  Hyponatremia -hold  Lasix, looks a bit on the dry side, will give some fluids later today  Essential hypertension -hold Entresto, Coreg decreased due to soft blood pressures in the setting of infection  CKD 3A -baseline creatinine 1.2-1.5, creatinine at baseline  Hypomagnesemia -replenish magnesium as indicated and continue to monitor  A fib with RVR -rate controlled, continue Eliquis, Coreg.  Followed by cardiology as an outpatient  Chronic combined CHF -his EF has improved.  Hold Entresto in the setting of hypotension  Hyperlipidemia -continue statin  IDDM, poorly controlled, with hyperglycemia-continue glargine, sliding scale.  Decrease insulin regimen today due to n.p.o.  CBG (last 3)  Recent Labs    07/14/22 2058 07/14/22 2257 07/15/22 0807  GLUCAP 63* 108* 147*     Lab Results  Component Value Date   HGBA1C 9.1 (H) 07/13/2022     Scheduled Meds:  apixaban  5 mg Oral BID   atorvastatin  80 mg Oral Daily   budesonide (PULMICORT) nebulizer solution  0.5 mg Nebulization BID   busPIRone  10 mg Oral BID   carvedilol  3.125 mg Oral BID   clopidogrel  75 mg Oral Q breakfast   digoxin  125 mcg Oral Daily   famotidine  20 mg Oral BID   insulin aspart  0-20 Units Subcutaneous TID WC   insulin glargine-yfgn  20 Units Subcutaneous Daily   ipratropium-albuterol  3 mL Nebulization TID   lidocaine  1 patch Transdermal Daily   spironolactone  25 mg Oral Daily   Continuous Infusions:  vancomycin 1,750 mg (07/15/22 0135)   PRN Meds:.acetaminophen **OR** acetaminophen, HYDROmorphone (DILAUDID) injection, hydrOXYzine, ondansetron **OR** ondansetron (ZOFRAN) IV,  mouth rinse, oxyCODONE  Current Outpatient Medications  Medication Instructions   apixaban (ELIQUIS) 5 mg, Oral, 2 times daily   aspirin EC 81 mg, Oral, Daily, Swallow whole.   atorvastatin (LIPITOR) 80 mg, Oral, Daily   Basaglar KwikPen 40 Units, Subcutaneous, Daily   busPIRone (BUSPAR) 10 mg, Oral, 2 times daily   carvedilol (COREG)  6.25 mg, Oral, 2 times daily   clopidogrel (PLAVIX) 75 mg, Oral, Daily with breakfast   digoxin (LANOXIN) 0.125 MG tablet Take 1 tablet (125 mcg total) by mouth once daily.   famotidine (PEPCID) 20 MG tablet Take 1 tablet (20 mg total) by mouth 2 times daily.   fluticasone (FLONASE) 50 MCG/ACT nasal spray 2 sprays, Each Nare, Daily   furosemide (LASIX) 40 mg, Oral, Daily   HumaLOG KwikPen 15 Units, Subcutaneous, 3 times daily   HYDROcodone-acetaminophen (NORCO/VICODIN) 5-325 MG tablet Take 1 tablet by mouth every 4 (four) hours as needed for up to 7 days for Moderate pain (4-6) or Severe pain (7-10).   sacubitril-valsartan (ENTRESTO) 49-51 MG 1 tablet, Oral, 2 times daily, Needs to see cardiology for refills   spironolactone (ALDACTONE) 25 mg, Oral, Daily    Diet Orders (From admission, onward)     Start     Ordered   07/15/22 0001  Diet NPO time specified Except for: Sips with Meds  Diet effective midnight       Comments: Patient to remain NPO until fully awake following the TEE.  Question:  Except for  Answer:  Sips with Meds   07/14/22 0910            DVT prophylaxis:  apixaban (ELIQUIS) tablet 5 mg   Lab Results  Component Value Date   PLT 302 07/15/2022      Code Status: Full Code  Family Communication: no family at bedside   Status is: Inpatient  Remains inpatient appropriate because: MRSA bacteremia  Level of care: Progressive  Consultants:  ID  Objective: Vitals:   07/14/22 2012 07/14/22 2102 07/15/22 0610 07/15/22 0936  BP:  (!) 130/99 123/89   Pulse:  89 98 94  Resp:   20   Temp:  98.4 F (36.9 C) 98.6 F (37 C)   TempSrc:  Oral Oral   SpO2: 95% 94% 100%   Weight:      Height:        Intake/Output Summary (Last 24 hours) at 07/15/2022 0958 Last data filed at 07/15/2022 0800 Gross per 24 hour  Intake 590 ml  Output 3900 ml  Net -3310 ml    Wt Readings from Last 3 Encounters:  07/12/22 81 kg  07/09/22 81.6 kg  07/01/22 83.5 kg     Examination:  Constitutional: NAD Eyes: lids and conjunctivae normal, no scleral icterus ENMT: mmm Neck: normal, supple Respiratory: clear to auscultation bilaterally, no wheezing, no crackles. Normal respiratory effort.  Cardiovascular: Regular rate and rhythm, no murmurs / rubs / gallops. No LE edema. Abdomen: soft, no distention, no tenderness. Bowel sounds positive.  Skin: no rashes Neurologic: no focal deficits, equal strength   Data Reviewed: I have independently reviewed following labs and imaging studies   CBC Recent Labs  Lab 07/09/22 1756 07/12/22 0850 07/13/22 0419 07/14/22 0525 07/15/22 0755  WBC 13.9* 17.0* 19.1* 22.6* 18.0*  HGB 13.9 15.2 12.3* 12.0* 13.5  HCT 40.0 44.9 36.7* 36.8* 40.6  PLT 180 248 205 270 302  MCV 92.2 92.4 94.3 95.6 94.4  MCH 32.0 31.3 31.6 31.2 31.4  MCHC  34.8 33.9 33.5 32.6 33.3  RDW 13.7 14.3 14.6 14.7 14.7  LYMPHSABS 0.4* 0.7  --   --   --   MONOABS 1.1* 1.3*  --   --   --   EOSABS 0.0 0.0  --   --   --   BASOSABS 0.1 0.1  --   --   --      Recent Labs  Lab 07/09/22 1756 07/12/22 0850 07/12/22 1050 07/12/22 1655 07/13/22 0410 07/13/22 0419 07/14/22 0525 07/15/22 0755  NA 125* 126*  --  126*  --  124* 125* 125*  K 3.9 4.8  --  5.0  --  4.8 4.7 4.9  CL 93* 90*  --  94*  --  93* 94* 92*  CO2 20* 24  --  23  --  22 20* 24  GLUCOSE 170* 375*  --  422*  --  437* 335* 160*  BUN 33* 35*  --  34*  --  35* 38* 30*  CREATININE 1.38* 1.41*  --  1.27*  --  1.32* 1.24 1.23  CALCIUM 8.8* 9.3  --  8.6*  --  8.3* 8.5* 9.0  AST 44* 46*  --   --   --  20 27  --   ALT 38 52*  --   --   --  33 36  --   ALKPHOS 98 171*  --   --   --  117 114  --   BILITOT 1.6* 1.4*  --   --   --  1.0 0.9  --   ALBUMIN 3.1* 3.0*  --   --   --  2.3* 2.5*  --   MG  --   --   --   --   --   --  2.1  --   LATICACIDVEN 1.5 3.8* 2.2*  --   --   --   --   --   HGBA1C  --   --   --   --  9.1*  --   --   --   BNP  --  319.6*  --   --   --   --   --   --       ------------------------------------------------------------------------------------------------------------------ No results for input(s): "CHOL", "HDL", "LDLCALC", "TRIG", "CHOLHDL", "LDLDIRECT" in the last 72 hours.  Lab Results  Component Value Date   HGBA1C 9.1 (H) 07/13/2022   ------------------------------------------------------------------------------------------------------------------ No results for input(s): "TSH", "T4TOTAL", "T3FREE", "THYROIDAB" in the last 72 hours.  Invalid input(s): "FREET3"  Cardiac Enzymes No results for input(s): "CKMB", "TROPONINI", "MYOGLOBIN" in the last 168 hours.  Invalid input(s): "CK" ------------------------------------------------------------------------------------------------------------------    Component Value Date/Time   BNP 319.6 (H) 07/12/2022 0850    CBG: Recent Labs  Lab 07/14/22 1147 07/14/22 1635 07/14/22 2058 07/14/22 2257 07/15/22 0807  GLUCAP 256* 136* 63* 108* 147*     Recent Results (from the past 240 hour(s))  Resp panel by RT-PCR (RSV, Flu A&B, Covid) Anterior Nasal Swab     Status: None   Collection Time: 07/09/22  5:56 PM   Specimen: Anterior Nasal Swab  Result Value Ref Range Status   SARS Coronavirus 2 by RT PCR NEGATIVE NEGATIVE Final    Comment: (NOTE) SARS-CoV-2 target nucleic acids are NOT DETECTED.  The SARS-CoV-2 RNA is generally detectable in upper respiratory specimens during the acute phase of infection. The lowest concentration of SARS-CoV-2 viral copies this assay can detect is 138 copies/mL. A negative result  does not preclude SARS-Cov-2 infection and should not be used as the sole basis for treatment or other patient management decisions. A negative result may occur with  improper specimen collection/handling, submission of specimen other than nasopharyngeal swab, presence of viral mutation(s) within the areas targeted by this assay, and inadequate number of viral copies(<138  copies/mL). A negative result must be combined with clinical observations, patient history, and epidemiological information. The expected result is Negative.  Fact Sheet for Patients:  BloggerCourse.com  Fact Sheet for Healthcare Providers:  SeriousBroker.it  This test is no t yet approved or cleared by the [place] FDA and  has been authorized for detection and/or diagnosis of SARS-CoV-2 by FDA under an Emergency Use Authorization (EUA). This EUA will remain  in effect (meaning this test can be used) for the duration of the COVID-19 declaration under Section 564(b)(1) of the Act, 21 U.S.C.section 360bbb-3(b)(1), unless the authorization is terminated  or revoked sooner.       Influenza A by PCR NEGATIVE NEGATIVE Final   Influenza B by PCR NEGATIVE NEGATIVE Final    Comment: (NOTE) The Xpert Xpress SARS-CoV-2/FLU/RSV plus assay is intended as an aid in the diagnosis of influenza from Nasopharyngeal swab specimens and should not be used as a sole basis for treatment. Nasal washings and aspirates are unacceptable for Xpert Xpress SARS-CoV-2/FLU/RSV testing.  Fact Sheet for Patients: BloggerCourse.com  Fact Sheet for Healthcare Providers: SeriousBroker.it  This test is not yet approved or cleared by the [place] FDA and has been authorized for detection and/or diagnosis of SARS-CoV-2 by FDA under an Emergency Use Authorization (EUA). This EUA will remain in effect (meaning this test can be used) for the duration of the COVID-19 declaration under Section 564(b)(1) of the Act, 21 U.S.C. section 360bbb-3(b)(1), unless the authorization is terminated or revoked.     Resp Syncytial Virus by PCR NEGATIVE NEGATIVE Final    Comment: (NOTE) Fact Sheet for Patients: BloggerCourse.com  Fact Sheet for Healthcare  Providers: SeriousBroker.it  This test is not yet approved or cleared by the [place] FDA and has been authorized for detection and/or diagnosis of SARS-CoV-2 by FDA under an Emergency Use Authorization (EUA). This EUA will remain in effect (meaning this test can be used) for the duration of the COVID-19 declaration under Section 564(b)(1) of the Act, 21 U.S.C. section 360bbb-3(b)(1), unless the authorization is terminated or revoked.  Performed at [facility], [address], [place], [place] [address]   Blood culture (routine x 2)     Status: Abnormal   Collection Time: 07/12/22  8:48 AM   Specimen: BLOOD RIGHT ARM  Result Value Ref Range Status   Specimen Description   Final    BLOOD RIGHT ARM Performed at [facility], [address] [place], [place] [address]    Special Requests   Final    BOTTLES DRAWN AEROBIC AND ANAEROBIC Blood Culture results may not be optimal due to an inadequate volume of blood received in culture bottles Performed at [facility], [address], [place], [place] [address]    Culture  Setup Time   Final    GRAM POSITIVE COCCI IN CLUSTERS IN BOTH AEROBIC AND ANAEROBIC BOTTLES CRITICAL VALUE NOTED.  VALUE IS CONSISTENT WITH PREVIOUSLY REPORTED AND CALLED VALUE.    Culture (A)  Final    STAPHYLOCOCCUS AUREUS SUSCEPTIBILITIES PERFORMED ON PREVIOUS CULTURE WITHIN THE LAST 5 DAYS. Performed at [facility], [address],  [place], [place] [address]    Report Status 07/14/2022 FINAL  Final  Blood culture (routine x 2)     Status: Abnormal (Preliminary result)   Collection Time: 07/12/22  8:50 AM   Specimen: BLOOD  Result Value Ref Range Status   Specimen Description   Final    BLOOD BLOOD LEFT ARM Performed at [facility], [address], [place], [place] [address]    Special Requests   Final    BOTTLES DRAWN AEROBIC AND ANAEROBIC Blood Culture  results may not be optimal due to an inadequate volume of blood received in culture bottles Performed at [facility], [address], [place], [place] [address]    Culture  Setup Time   Final    GRAM POSITIVE COCCI IN CLUSTERS IN BOTH AEROBIC AND ANAEROBIC BOTTLES CRITICAL RESULT CALLED TO, READ BACK BY AND VERIFIED WITH: PHARMD M. Doran Durand 07/13/22 @ 0152 BY AB    Culture (A)  Final    METHICILLIN RESISTANT STAPHYLOCOCCUS AUREUS [facility] FOR DAPTOMYCIN Performed at [facility], [address] [place], [place] [address]    Report Status PENDING  Incomplete   Organism ID, Bacteria METHICILLIN RESISTANT STAPHYLOCOCCUS AUREUS  Final      Susceptibility   Methicillin resistant staphylococcus aureus - MIC*    CIPROFLOXACIN >=8 RESISTANT Resistant     ERYTHROMYCIN >=8 RESISTANT Resistant     GENTAMICIN <=0.5 SENSITIVE Sensitive     OXACILLIN >=4 RESISTANT Resistant     TETRACYCLINE <=1 SENSITIVE Sensitive     VANCOMYCIN <=0.5 SENSITIVE Sensitive     TRIMETH/SULFA >=320 RESISTANT Resistant     CLINDAMYCIN <=0.25 SENSITIVE Sensitive     RIFAMPIN <=0.5 SENSITIVE Sensitive     Inducible Clindamycin NEGATIVE Sensitive     * METHICILLIN RESISTANT STAPHYLOCOCCUS AUREUS  Blood Culture ID Panel (Reflexed)     Status: Abnormal   Collection Time: 07/12/22  8:50 AM  Result Value Ref Range Status   Enterococcus faecalis NOT DETECTED NOT DETECTED Final   Enterococcus Faecium NOT DETECTED NOT DETECTED Final   Listeria monocytogenes NOT DETECTED NOT DETECTED Final   Staphylococcus species DETECTED (A) NOT DETECTED Final    Comment: CRITICAL RESULT CALLED TO, READ BACK BY AND VERIFIED WITH: PHARMD M. LILLISTON 07/13/22 @ 0152 BY AB    Staphylococcus aureus (BCID) DETECTED (A) NOT DETECTED Final    Comment: Methicillin (oxacillin)-resistant Staphylococcus aureus (MRSA). MRSA is predictably resistant to beta-lactam antibiotics (except ceftaroline). Preferred  therapy is vancomycin unless clinically contraindicated. Patient requires contact precautions if  hospitalized. CRITICAL RESULT CALLED TO, READ BACK BY AND VERIFIED WITH: PHARMD M. LILLISTON 07/13/22 @ 0152 BY AB    Staphylococcus epidermidis NOT DETECTED NOT DETECTED Final   Staphylococcus lugdunensis NOT DETECTED NOT DETECTED Final   Streptococcus species NOT DETECTED NOT DETECTED Final   Streptococcus agalactiae NOT DETECTED NOT DETECTED Final   Streptococcus pneumoniae NOT DETECTED NOT DETECTED Final   Streptococcus pyogenes NOT DETECTED NOT DETECTED Final   A.calcoaceticus-baumannii NOT DETECTED NOT DETECTED Final   Bacteroides fragilis NOT DETECTED NOT DETECTED Final   Enterobacterales NOT DETECTED NOT DETECTED Final   Enterobacter cloacae complex NOT DETECTED NOT DETECTED Final   Escherichia coli NOT DETECTED NOT DETECTED Final   Klebsiella aerogenes NOT DETECTED NOT DETECTED Final   Klebsiella oxytoca NOT DETECTED NOT DETECTED Final   Klebsiella pneumoniae NOT DETECTED NOT DETECTED Final   Proteus species NOT DETECTED NOT DETECTED Final   Salmonella species NOT DETECTED  NOT DETECTED Final   Serratia marcescens NOT DETECTED NOT DETECTED Final   Haemophilus influenzae NOT DETECTED NOT DETECTED Final   Neisseria meningitidis NOT DETECTED NOT DETECTED Final   Pseudomonas aeruginosa NOT DETECTED NOT DETECTED Final   Stenotrophomonas maltophilia NOT DETECTED NOT DETECTED Final   Candida albicans NOT DETECTED NOT DETECTED Final   Candida auris NOT DETECTED NOT DETECTED Final   Candida glabrata NOT DETECTED NOT DETECTED Final   Candida krusei NOT DETECTED NOT DETECTED Final   Candida parapsilosis NOT DETECTED NOT DETECTED Final   Candida tropicalis NOT DETECTED NOT DETECTED Final   Cryptococcus neoformans/gattii NOT DETECTED NOT DETECTED Final   Meth resistant mecA/C and MREJ DETECTED (A) NOT DETECTED Final    Comment: CRITICAL RESULT CALLED TO, READ BACK BY AND VERIFIED  WITH: PHARMD M. LILLISTON 07/13/22 @ 0152 BY AB Performed at [facility], [address] [place], [place] [address]   SARS Coronavirus 2 by RT PCR (hospital order, performed in [facility] hospital lab) *cepheid single result test* Anterior Nasal Swab     Status: None   Collection Time: 07/12/22  9:24 AM   Specimen: Anterior Nasal Swab  Result Value Ref Range Status   SARS Coronavirus 2 by RT PCR NEGATIVE NEGATIVE Final    Comment: (NOTE) SARS-CoV-2 target nucleic acids are NOT DETECTED.  The SARS-CoV-2 RNA is generally detectable in upper and lower respiratory specimens during the acute phase of infection. The lowest concentration of SARS-CoV-2 viral copies this assay can detect is 250 copies / mL. A negative result does not preclude SARS-CoV-2 infection and should not be used as the sole basis for treatment or other patient management decisions.  A negative result may occur with improper specimen collection / handling, submission of specimen other than nasopharyngeal swab, presence of viral mutation(s) within the areas targeted by this assay, and inadequate number of viral copies (<250 copies / mL). A negative result must be combined with clinical observations, patient history, and epidemiological information.  Fact Sheet for Patients:   RoadLapTop.co.za  Fact Sheet for Healthcare Providers: http://kim-miller.com/  This test is not yet approved or  cleared by the [place] FDA and has been authorized for detection and/or diagnosis of SARS-CoV-2 by FDA under an Emergency Use Authorization (EUA).  This EUA will remain in effect (meaning this test can be used) for the duration of the COVID-19 declaration under Section 564(b)(1) of the Act, 21 U.S.C. section 360bbb-3(b)(1), unless the authorization is terminated or revoked sooner.  Performed at [facility], [address] [place], [place] [address]    Respiratory (~20 pathogens) panel by PCR     Status: None   Collection Time: 07/12/22  2:03 PM   Specimen: Nasopharyngeal Swab; Respiratory  Result Value Ref Range Status   Adenovirus NOT DETECTED NOT DETECTED Final   Coronavirus 229E NOT DETECTED NOT DETECTED Final    Comment: (NOTE) The Coronavirus on the Respiratory Panel, DOES NOT test for the novel  Coronavirus (2019 nCoV)    Coronavirus HKU1 NOT DETECTED NOT DETECTED Final   Coronavirus NL63 NOT DETECTED NOT DETECTED Final   Coronavirus OC43 NOT DETECTED NOT DETECTED Final   Metapneumovirus NOT DETECTED NOT DETECTED Final   Rhinovirus / Enterovirus NOT DETECTED NOT DETECTED Final   Influenza A NOT DETECTED NOT DETECTED Final   Influenza B NOT DETECTED NOT DETECTED Final   Parainfluenza Virus 1 NOT DETECTED NOT DETECTED Final   Parainfluenza Virus 2 NOT DETECTED NOT DETECTED  Final   Parainfluenza Virus 3 NOT DETECTED NOT DETECTED Final   Parainfluenza Virus 4 NOT DETECTED NOT DETECTED Final   Respiratory Syncytial Virus NOT DETECTED NOT DETECTED Final   Bordetella pertussis NOT DETECTED NOT DETECTED Final   Bordetella Parapertussis NOT DETECTED NOT DETECTED Final   Chlamydophila pneumoniae NOT DETECTED NOT DETECTED Final   Mycoplasma pneumoniae NOT DETECTED NOT DETECTED Final    Comment: Performed at [facility], [address], [place], [place] [address]  Minimum Inhibitory Conc. (1 Drug)     Status: None (Preliminary result)   Collection Time: 07/13/22  2:47 PM  Result Value Ref Range Status   Min Inhibitory Conc (1 Drug) Preliminary report  Final    Comment: (NOTE) Performed At: [facility] [address] [place], [place] [id number] Jolene Schimke MD [id number]    Source CRE PENDING  Incomplete  Culture, blood (Routine X 2) w Reflex to ID Panel     Status: None (Preliminary result)   Collection Time: 07/14/22  5:25 AM   Specimen: BLOOD  Result Value Ref Range Status   Specimen Description   Final     BLOOD LEFT ANTECUBITAL Performed at [facility], [address] [place], [place] [address]    Special Requests   Final    BOTTLES DRAWN AEROBIC AND ANAEROBIC Blood Culture adequate volume Performed at [facility], [address], [place], [place] [address]    Culture   Final    NO GROWTH 1 DAY Performed at [facility], [address], [place], [place] [address]    Report Status PENDING  Incomplete  Culture, blood (Routine X 2) w Reflex to ID Panel     Status: None (Preliminary result)   Collection Time: 07/14/22  5:25 AM   Specimen: BLOOD  Result Value Ref Range Status   Specimen Description   Final    BLOOD BLOOD LEFT HAND Performed at [facility], [address], [place], [place] [address]    Special Requests   Final    BOTTLES DRAWN AEROBIC AND ANAEROBIC Blood Culture adequate volume Performed at [facility], [address], [place], [place] [address]    Culture   Final    NO GROWTH 1 DAY Performed at [facility], [address], [place], [place] [address]    Report Status PENDING  Incomplete     Radiology Studies: No results found.   Pamella Pert, MD, PhD [facility]  Between 7 am - 7 pm I am available, please contact me via Amion (for emergencies) or Securechat (non urgent messages)  Between 7 pm - 7 am I am not available, please contact night coverage MD/APP via Amion

## 2022-07-15 NOTE — Progress Notes (Signed)
Patient did not dressing change done at the moment, stated it get changed at night time.

## 2022-07-15 NOTE — Progress Notes (Addendum)
[facility]   Reason for visit: Follow up on bacteremia   Interval History: TEE negative for vegetation; WBC 18, remains afebrile.   Repeat blood cultures remain ngtd.    Physical Exam: Constitutional:  Vitals:   07/15/22 1238 07/15/22 1319  BP: (!) 116/92 130/84  Pulse: 84 79  Resp: 20 18  Temp:  97.7 F (36.5 C)  SpO2: 96%    patient appears in NAD  Review of Systems: Constitutional: negative for fevers  Lab Results  Component Value Date   WBC 18.0 (H) 07/15/2022   HGB 13.5 07/15/2022   HCT 40.6 07/15/2022   MCV 94.4 07/15/2022   PLT 302 07/15/2022    Lab Results  Component Value Date   CREATININE 1.23 07/15/2022   BUN 30 (H) 07/15/2022   NA 125 (L) 07/15/2022   K 4.9 07/15/2022   CL 92 (L) 07/15/2022   CO2 24 07/15/2022    Lab Results  Component Value Date   ALT 36 07/14/2022   AST 27 07/14/2022   ALKPHOS 114 07/14/2022     Microbiology: Recent Results (from the past 240 hour(s))  Resp panel by RT-PCR (RSV, Flu A&B, Covid) Anterior Nasal Swab     Status: None   Collection Time: 07/09/22  5:56 PM   Specimen: Anterior Nasal Swab  Result Value Ref Range Status   SARS Coronavirus 2 by RT PCR NEGATIVE NEGATIVE Final    Comment: (NOTE) SARS-CoV-2 target nucleic acids are NOT DETECTED.  The SARS-CoV-2 RNA is generally detectable in upper respiratory specimens during the acute phase of infection. The lowest concentration of SARS-CoV-2 viral copies this assay can detect is 138 copies/mL. A negative result does not preclude SARS-Cov-2 infection and should not be used as the sole basis for treatment or other patient management decisions. A negative result may occur with  improper specimen collection/handling, submission of specimen other than nasopharyngeal swab, presence of viral mutation(s) within the areas targeted by this assay, and inadequate number of viral copies(<138 copies/mL). A negative result must be combined  with clinical observations, patient history, and epidemiological information. The expected result is Negative.  Fact Sheet for Patients:  BloggerCourse.com  Fact Sheet for Healthcare Providers:  SeriousBroker.it  This test is no t yet approved or cleared by the [place] FDA and  has been authorized for detection and/or diagnosis of SARS-CoV-2 by FDA under an Emergency Use Authorization (EUA). This EUA will remain  in effect (meaning this test can be used) for the duration of the COVID-19 declaration under Section 564(b)(1) of the Act, 21 U.S.C.section 360bbb-3(b)(1), unless the authorization is terminated  or revoked sooner.       Influenza A by PCR NEGATIVE NEGATIVE Final   Influenza B by PCR NEGATIVE NEGATIVE Final    Comment: (NOTE) The Xpert Xpress SARS-CoV-2/FLU/RSV plus assay is intended as an aid in the diagnosis of influenza from Nasopharyngeal swab specimens and should not be used as a sole basis for treatment. Nasal washings and aspirates are unacceptable for Xpert Xpress SARS-CoV-2/FLU/RSV testing.  Fact Sheet for Patients: BloggerCourse.com  Fact Sheet for Healthcare Providers: SeriousBroker.it  This test is not yet approved or cleared by the [place] FDA and has been authorized for detection and/or diagnosis of SARS-CoV-2 by FDA under an Emergency Use Authorization (EUA). This EUA will remain in effect (meaning this test can be used) for the duration of the COVID-19 declaration under Section 564(b)(1) of the Act, 21 U.S.C. section 360bbb-3(b)(1), unless  the authorization is terminated or revoked.     Resp Syncytial Virus by PCR NEGATIVE NEGATIVE Final    Comment: (NOTE) Fact Sheet for Patients: BloggerCourse.com  Fact Sheet for Healthcare Providers: SeriousBroker.it  This test is not yet approved  or cleared by the [place] FDA and has been authorized for detection and/or diagnosis of SARS-CoV-2 by FDA under an Emergency Use Authorization (EUA). This EUA will remain in effect (meaning this test can be used) for the duration of the COVID-19 declaration under Section 564(b)(1) of the Act, 21 U.S.C. section 360bbb-3(b)(1), unless the authorization is terminated or revoked.  Performed at [facility], [address], [place], [place] [address]   Blood culture (routine x 2)     Status: Abnormal   Collection Time: 07/12/22  8:48 AM   Specimen: BLOOD RIGHT ARM  Result Value Ref Range Status   Specimen Description   Final    BLOOD RIGHT ARM Performed at [facility], [address], [place], [place] [address]    Special Requests   Final    BOTTLES DRAWN AEROBIC AND ANAEROBIC Blood Culture results may not be optimal due to an inadequate volume of blood received in culture bottles Performed at [facility], [address], [place], [place] [address]    Culture  Setup Time   Final    GRAM POSITIVE COCCI IN CLUSTERS IN BOTH AEROBIC AND ANAEROBIC BOTTLES CRITICAL VALUE NOTED.  VALUE IS CONSISTENT WITH PREVIOUSLY REPORTED AND CALLED VALUE.    Culture (A)  Final    STAPHYLOCOCCUS AUREUS SUSCEPTIBILITIES PERFORMED ON PREVIOUS CULTURE WITHIN THE LAST 5 DAYS. Performed at [facility], [address] [place], [place] [address]    Report Status 07/14/2022 FINAL  Final  Blood culture (routine x 2)     Status: Abnormal (Preliminary result)   Collection Time: 07/12/22  8:50 AM   Specimen: BLOOD  Result Value Ref Range Status   Specimen Description   Final    BLOOD BLOOD LEFT ARM Performed at [facility], [address], [place], [place] [address]    Special Requests   Final    BOTTLES DRAWN AEROBIC AND ANAEROBIC Blood Culture results may not be optimal due to an inadequate volume of blood received in culture  bottles Performed at [facility], [address] [place], [place] [address]    Culture  Setup Time   Final    GRAM POSITIVE COCCI IN CLUSTERS IN BOTH AEROBIC AND ANAEROBIC BOTTLES CRITICAL RESULT CALLED TO, READ BACK BY AND VERIFIED WITH: PHARMD M. Doran Durand 07/13/22 @ 0152 BY AB    Culture (A)  Final    METHICILLIN RESISTANT STAPHYLOCOCCUS AUREUS [facility] FOR DAPTOMYCIN Performed at [facility], [address], [place], [place] [address]    Report Status PENDING  Incomplete   Organism ID, Bacteria METHICILLIN RESISTANT STAPHYLOCOCCUS AUREUS  Final      Susceptibility   Methicillin resistant staphylococcus aureus - MIC*    CIPROFLOXACIN >=8 RESISTANT Resistant     ERYTHROMYCIN >=8 RESISTANT Resistant     GENTAMICIN <=0.5 SENSITIVE Sensitive     OXACILLIN >=4 RESISTANT Resistant     TETRACYCLINE <=1 SENSITIVE Sensitive     VANCOMYCIN <=0.5 SENSITIVE Sensitive     TRIMETH/SULFA >=320 RESISTANT Resistant     CLINDAMYCIN <=0.25 SENSITIVE Sensitive     RIFAMPIN <=0.5 SENSITIVE Sensitive     Inducible Clindamycin NEGATIVE Sensitive     *  METHICILLIN RESISTANT STAPHYLOCOCCUS AUREUS  Blood Culture ID Panel (Reflexed)     Status: Abnormal   Collection Time: 07/12/22  8:50 AM  Result Value Ref Range Status   Enterococcus faecalis NOT DETECTED NOT DETECTED Final   Enterococcus Faecium NOT DETECTED NOT DETECTED Final   Listeria monocytogenes NOT DETECTED NOT DETECTED Final   Staphylococcus species DETECTED (A) NOT DETECTED Final    Comment: CRITICAL RESULT CALLED TO, READ BACK BY AND VERIFIED WITH: PHARMD M. LILLISTON 07/13/22 @ 0152 BY AB    Staphylococcus aureus (BCID) DETECTED (A) NOT DETECTED Final    Comment: Methicillin (oxacillin)-resistant Staphylococcus aureus (MRSA). MRSA is predictably resistant to beta-lactam antibiotics (except ceftaroline). Preferred therapy is vancomycin unless clinically contraindicated. Patient requires contact  precautions if  hospitalized. CRITICAL RESULT CALLED TO, READ BACK BY AND VERIFIED WITH: PHARMD M. LILLISTON 07/13/22 @ 0152 BY AB    Staphylococcus epidermidis NOT DETECTED NOT DETECTED Final   Staphylococcus lugdunensis NOT DETECTED NOT DETECTED Final   Streptococcus species NOT DETECTED NOT DETECTED Final   Streptococcus agalactiae NOT DETECTED NOT DETECTED Final   Streptococcus pneumoniae NOT DETECTED NOT DETECTED Final   Streptococcus pyogenes NOT DETECTED NOT DETECTED Final   A.calcoaceticus-baumannii NOT DETECTED NOT DETECTED Final   Bacteroides fragilis NOT DETECTED NOT DETECTED Final   Enterobacterales NOT DETECTED NOT DETECTED Final   Enterobacter cloacae complex NOT DETECTED NOT DETECTED Final   Escherichia coli NOT DETECTED NOT DETECTED Final   Klebsiella aerogenes NOT DETECTED NOT DETECTED Final   Klebsiella oxytoca NOT DETECTED NOT DETECTED Final   Klebsiella pneumoniae NOT DETECTED NOT DETECTED Final   Proteus species NOT DETECTED NOT DETECTED Final   Salmonella species NOT DETECTED NOT DETECTED Final   Serratia marcescens NOT DETECTED NOT DETECTED Final   Haemophilus influenzae NOT DETECTED NOT DETECTED Final   Neisseria meningitidis NOT DETECTED NOT DETECTED Final   Pseudomonas aeruginosa NOT DETECTED NOT DETECTED Final   Stenotrophomonas maltophilia NOT DETECTED NOT DETECTED Final   Candida albicans NOT DETECTED NOT DETECTED Final   Candida auris NOT DETECTED NOT DETECTED Final   Candida glabrata NOT DETECTED NOT DETECTED Final   Candida krusei NOT DETECTED NOT DETECTED Final   Candida parapsilosis NOT DETECTED NOT DETECTED Final   Candida tropicalis NOT DETECTED NOT DETECTED Final   Cryptococcus neoformans/gattii NOT DETECTED NOT DETECTED Final   Meth resistant mecA/C and MREJ DETECTED (A) NOT DETECTED Final    Comment: CRITICAL RESULT CALLED TO, READ BACK BY AND VERIFIED WITH: PHARMD M. LILLISTON 07/13/22 @ 0152 BY AB Performed at [facility], [address], [place], [place] [address]   SARS Coronavirus 2 by RT PCR (hospital order, performed in [facility] hospital lab) *cepheid single result test* Anterior Nasal Swab     Status: None   Collection Time: 07/12/22  9:24 AM   Specimen: Anterior Nasal Swab  Result Value Ref Range Status   SARS Coronavirus 2 by RT PCR NEGATIVE NEGATIVE Final    Comment: (NOTE) SARS-CoV-2 target nucleic acids are NOT DETECTED.  The SARS-CoV-2 RNA is generally detectable in upper and lower respiratory specimens during the acute phase of infection. The lowest concentration of SARS-CoV-2 viral copies this assay can detect is 250 copies / mL. A negative result does not preclude SARS-CoV-2 infection and should not be used as the sole basis for treatment or other patient management decisions.  A negative result may occur with improper specimen collection / handling, submission of specimen other than nasopharyngeal swab,  presence of viral mutation(s) within the areas targeted by this assay, and inadequate number of viral copies (<250 copies / mL). A negative result must be combined with clinical observations, patient history, and epidemiological information.  Fact Sheet for Patients:   RoadLapTop.co.za  Fact Sheet for Healthcare Providers: http://kim-miller.com/  This test is not yet approved or  cleared by the [place] FDA and has been authorized for detection and/or diagnosis of SARS-CoV-2 by FDA under an Emergency Use Authorization (EUA).  This EUA will remain in effect (meaning this test can be used) for the duration of the COVID-19 declaration under Section 564(b)(1) of the Act, 21 U.S.C. section 360bbb-3(b)(1), unless the authorization is terminated or revoked sooner.  Performed at [facility], [address] [place], [place] [address]   Respiratory (~20 pathogens) panel by PCR     Status: None   Collection Time: 07/12/22  2:03 PM    Specimen: Nasopharyngeal Swab; Respiratory  Result Value Ref Range Status   Adenovirus NOT DETECTED NOT DETECTED Final   Coronavirus 229E NOT DETECTED NOT DETECTED Final    Comment: (NOTE) The Coronavirus on the Respiratory Panel, DOES NOT test for the novel  Coronavirus (2019 nCoV)    Coronavirus HKU1 NOT DETECTED NOT DETECTED Final   Coronavirus NL63 NOT DETECTED NOT DETECTED Final   Coronavirus OC43 NOT DETECTED NOT DETECTED Final   Metapneumovirus NOT DETECTED NOT DETECTED Final   Rhinovirus / Enterovirus NOT DETECTED NOT DETECTED Final   Influenza A NOT DETECTED NOT DETECTED Final   Influenza B NOT DETECTED NOT DETECTED Final   Parainfluenza Virus 1 NOT DETECTED NOT DETECTED Final   Parainfluenza Virus 2 NOT DETECTED NOT DETECTED Final   Parainfluenza Virus 3 NOT DETECTED NOT DETECTED Final   Parainfluenza Virus 4 NOT DETECTED NOT DETECTED Final   Respiratory Syncytial Virus NOT DETECTED NOT DETECTED Final   Bordetella pertussis NOT DETECTED NOT DETECTED Final   Bordetella Parapertussis NOT DETECTED NOT DETECTED Final   Chlamydophila pneumoniae NOT DETECTED NOT DETECTED Final   Mycoplasma pneumoniae NOT DETECTED NOT DETECTED Final    Comment: Performed at [facility], [address], [place], [place] [address]  Minimum Inhibitory Conc. (1 Drug)     Status: None (Preliminary result)   Collection Time: 07/13/22  2:47 PM  Result Value Ref Range Status   Min Inhibitory Conc (1 Drug) Preliminary report  Final    Comment: (NOTE) Performed At: [facility] [address] [place], [place] [id number] Jolene Schimke MD WG:[id number]    Source CRE PENDING  Incomplete  Culture, blood (Routine X 2) w Reflex to ID Panel     Status: None (Preliminary result)   Collection Time: 07/14/22  5:25 AM   Specimen: BLOOD  Result Value Ref Range Status   Specimen Description   Final    BLOOD LEFT ANTECUBITAL Performed at [facility], [address]  [place], [place] [address]    Special Requests   Final    BOTTLES DRAWN AEROBIC AND ANAEROBIC Blood Culture adequate volume Performed at [facility], [address], [place], [place] [address]    Culture   Final    NO GROWTH 1 DAY Performed at [facility], [address], [place], [place] [address]    Report Status PENDING  Incomplete  Culture, blood (Routine X 2) w Reflex to ID Panel     Status: None (Preliminary result)   Collection Time: 07/14/22  5:25  AM   Specimen: BLOOD  Result Value Ref Range Status   Specimen Description   Final    BLOOD BLOOD LEFT HAND Performed at [facility], [address] [place], [place] [address]    Special Requests   Final    BOTTLES DRAWN AEROBIC AND ANAEROBIC Blood Culture adequate volume Performed at [facility], [address] [place], [place] [address]    Culture   Final    NO GROWTH 1 DAY Performed at [facility], [address] [place], [place] [address]    Report Status PENDING  Incomplete    Impression/Plan:  1. MRSA bacteremia - MRI, xray of foot and TEE negative for any source of infection.  Repeat cultures ngtd.  WBC and fever trending down.   Seems to be improving. I recommend he continue with IV antibiotics to complete 2 weeks from his negative blood culture yesterday through 07/28/22 Ok to order a picc line Sunday if the repeat blood cultures remain negative OPAT order placed MIC for daptomycin sent  Dr. Earlene Plater available over the weekend if needed, otherwise will sign off.    Diagnosis: MRSA bacteremia  Culture Result: MRSA  No Known Allergies  OPAT Orders Discharge antibiotics to be given via PICC line Discharge antibiotics: datpomycin 650 mg IV every 24 hours Per pharmacy protocol yes Duration: 2 weeks  End Date: 07/28/22  [facility] Care Per Protocol: yes  Home health RN for IV administration and teaching; PICC line care and labs.    Labs weekly  while on IV antibiotics: _x_ CBC with differential _x_ CMP  _x_ CK  _x_ Please pull PIC at completion of IV antibiotics __ Please leave PIC in place until doctor has seen patient or been notified  Fax weekly labs to [phone]  Clinic Follow Up Appt: 07/28/22 with Dr. Thedore Mins at 10:30am   I have personally spent 50 minutes involved in face-to-face and non-face-to-face activities for this patient on the day of the visit. Professional time spent includes the following activities: Preparing to see the patient (review of tests), Obtaining and/or reviewing separately obtained history (admission/discharge record), Performing a medically appropriate examination and/or evaluation , Ordering medications/tests/procedures, referring and communicating with other health care professionals, Documenting clinical information in the EMR, Independently interpreting results (not separately reported), Communicating results to the patient/family/caregiver, Counseling and educating the patient/family/caregiver and Care coordination (not separately reported).

## 2022-07-15 NOTE — Progress Notes (Signed)
Patient drowsy post TEE, activated bed alarm and educated patient on the safety plan, to call before getting up, patient verbalized understanding. Later he got up to the bathroom without calling, was angry stated the noise woke him up while he was resting without pain and he is having pain now. PRN pain med given.

## 2022-07-16 ENCOUNTER — Other Ambulatory Visit: Payer: Self-pay

## 2022-07-16 ENCOUNTER — Inpatient Hospital Stay (HOSPITAL_COMMUNITY): Payer: Medicaid Other

## 2022-07-16 LAB — CULTURE, BLOOD (ROUTINE X 2): Special Requests: ADEQUATE

## 2022-07-16 LAB — GLUCOSE, CAPILLARY
Glucose-Capillary: 107 mg/dL — ABNORMAL HIGH (ref 70–99)
Glucose-Capillary: 146 mg/dL — ABNORMAL HIGH (ref 70–99)
Glucose-Capillary: 165 mg/dL — ABNORMAL HIGH (ref 70–99)

## 2022-07-16 LAB — CK: Total CK: 20 U/L — ABNORMAL LOW (ref 49–397)

## 2022-07-16 MED ORDER — SODIUM CHLORIDE 0.9% FLUSH: 10.0000 mL | INTRAVENOUS | Status: DC | PRN

## 2022-07-16 MED ORDER — CHLORHEXIDINE GLUCONATE CLOTH 2 % EX PADS
6.0000 | MEDICATED_PAD | Freq: Every day | CUTANEOUS | Status: DC
  Administered 2022-07-16 – 2022-08-18 (×32): 6 via TOPICAL

## 2022-07-16 MED ORDER — SENNOSIDES-DOCUSATE SODIUM 8.6-50 MG PO TABS
2.0000 | ORAL_TABLET | Freq: Two times a day (BID) | ORAL | Status: DC
  Administered 2022-07-16 – 2022-08-18 (×60): 2 via ORAL
  Filled 2022-07-16 (×63): qty 2

## 2022-07-16 MED ORDER — SACUBITRIL-VALSARTAN 49-51 MG PO TABS
1.0000 | ORAL_TABLET | Freq: Two times a day (BID) | ORAL | Status: DC
  Administered 2022-07-16 – 2022-07-17 (×3): 1 via ORAL
  Filled 2022-07-16 (×4): qty 1

## 2022-07-16 MED ORDER — OXYCODONE HCL 5 MG PO TABS
10.0000 mg | ORAL_TABLET | ORAL | Status: DC | PRN
  Administered 2022-07-16 – 2022-07-18 (×9): 15 mg via ORAL
  Filled 2022-07-16 (×9): qty 3

## 2022-07-16 MED ORDER — DAPTOMYCIN IV (FOR PTA / DISCHARGE USE ONLY)
650.0000 mg | INTRAVENOUS | 0 refills | Status: DC
Start: 2022-07-16 — End: 2022-07-19

## 2022-07-16 MED ORDER — CYCLOBENZAPRINE HCL 5 MG PO TABS
7.5000 mg | ORAL_TABLET | Freq: Three times a day (TID) | ORAL | Status: DC | PRN
  Administered 2022-07-16 – 2022-07-17 (×4): 7.5 mg via ORAL
  Filled 2022-07-16 (×4): qty 2

## 2022-07-16 MED ORDER — SODIUM CHLORIDE 0.9% FLUSH
10.0000 mL | Freq: Two times a day (BID) | INTRAVENOUS | Status: DC
  Administered 2022-07-16 – 2022-07-20 (×5): 10 mL
  Administered 2022-07-21: 20 mL
  Administered 2022-07-21 – 2022-07-22 (×3): 10 mL
  Administered 2022-07-23: 20 mL
  Administered 2022-07-23 – 2022-07-24 (×2): 10 mL
  Administered 2022-07-24: 30 mL
  Administered 2022-07-25: 20 mL
  Administered 2022-07-26 – 2022-08-10 (×18): 10 mL

## 2022-07-16 NOTE — TOC Initial Note (Signed)
Transition of Care [facility]) - Initial/Assessment Note    Patient Details  Name: David [name] MRN: [id number] Date of Birth: 01/02/1961  [facility]) CM/SW Contact:    Otelia Santee, LCSW Phone Number: 07/16/2022, 2:31 PM  Clinical Narrative:                 Pt is planned to return home with IV abx for 2 weeks. Jeri Modena with [facility] is following for IV abx equipment and education. Have contacted [facility] for charity home health PT/RN. Awaiting response from [facility].   Expected Discharge Plan: Home w Home Health Services Barriers to Discharge: No Barriers Identified   Patient Goals and CMS Choice   CMS Medicare.gov Compare Post Acute Care list provided to:: Patient Choice offered to / list presented to : Patient [place] ownership interest in [facility].provided to:: Patient    Expected Discharge Plan and Services In-house Referral: Clinical Social Work Discharge Planning Services: NA Post Acute Care Choice: Home Health Living arrangements for the past 2 months: Hotel/Motel                 DME Arranged: IV pump/equipment DME Agency:  [facility]) Date DME Agency Contacted: 07/16/22 Time DME Agency Contacted: 1430 Representative spoke with at DME Agency: Jeri Modena            Prior Living Arrangements/Services Living arrangements for the past 2 months: Hotel/Motel Lives with:: Self Patient language and need for interpreter reviewed:: Yes Do you feel safe going back to the place where you live?: Yes      Need for Family Participation in Patient Care: No (Comment) Care giver support system in place?: No (comment)   Criminal Activity/Legal Involvement Pertinent to Current Situation/Hospitalization: No - Comment as needed  Activities of Daily Living Home Assistive Devices/Equipment: Cane (specify quad or straight) ADL Screening (condition at time of admission) Patient's cognitive ability adequate to safely complete daily activities?:  Yes Is the patient deaf or have difficulty hearing?: No Does the patient have difficulty seeing, even when wearing glasses/contacts?: No Does the patient have difficulty concentrating, remembering, or making decisions?: No Patient able to express need for assistance with ADLs?: Yes Does the patient have difficulty dressing or bathing?: No Independently performs ADLs?: Yes (appropriate for developmental age) Does the patient have difficulty walking or climbing stairs?: Yes Weakness of Legs: Both Weakness of Arms/Hands: None  Permission Sought/Granted   Permission granted to share information with : No              Emotional Assessment   Attitude/Demeanor/Rapport: Unable to Assess Affect (typically observed): Unable to Assess Orientation: : Oriented to Self, Oriented to Place, Oriented to  Time, Oriented to Situation Alcohol / Substance Use: Not Applicable Psych Involvement: No (comment)  Admission diagnosis:  Bronchitis [J40] Acute respiratory failure with hypoxia [J96.01] Atrial fibrillation with RVR [I48.91] Patient Active Problem List   Diagnosis Date Noted   Bacteremia due to methicillin resistant Staphylococcus aureus 07/15/2022   Acute respiratory failure with hypoxia 07/12/2022   Atelectasis 07/12/2022   Sepsis due to pneumonia 07/12/2022   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    Gangrene of toe of right foot    Sepsis 08/29/2021   SIRS (systemic inflammatory response syndrome) 08/29/2021   Hyponatremia 08/29/2021   Hypomagnesemia 08/29/2021   Cellulitis in diabetic foot 08/29/2021   Pure hypercholesterolemia 06/17/2021   Pain of left hand 09/06/2019   Cellulitis 08/26/2019   Persistent atrial fibrillation 08/26/2019  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 [place] [address] Phone: [phone] Fax: [phone]     Social Determinants of Health (SDOH) Social History: SDOH Screenings   Food Insecurity: No Food Insecurity (07/12/2022)  Housing: Low Risk  (07/12/2022)  Transportation Needs: No Transportation Needs (07/12/2022)  Utilities: Not At Risk (07/12/2022)  Depression (PHQ2-9): Low Risk  (11/12/2021)  Stress: Stress Concern Present (09/26/2017)  Tobacco Use: Medium Risk (07/12/2022)   SDOH Interventions:     Readmission Risk Interventions    09/06/2021   12:07 PM  Readmission Risk Prevention Plan  Post Dischage Appt Complete  Medication Screening Complete  Transportation Screening Complete

## 2022-07-16 NOTE — Plan of Care (Signed)

## 2022-07-16 NOTE — Progress Notes (Signed)
PROGRESS NOTE  David [name] EAV:[id number] DOB: 08-08-1960 DOA: 07/12/2022 PCP: [name], MD   LOS: 4 days   Brief Narrative / Interim history: 62 y.o. 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 His appetite has been decreased. He had a fever at home of 105 F. He was having significant chills and rigors.  His rigors were so intense that produced the above-mentioned back pain.   Subjective / 24h Interval events: Back pain continues to be quite severe.  Feels like he is having difficulties to stay on top of it but oral oxycodone does work  Energy manager and Plan: Principal Problem:   Acute respiratory failure with hypoxia Active Problems:   Essential hypertension   Insulin dependent type 2 diabetes mellitus   Atrial fibrillation with RVR   Sleep apnea suspected   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)   Pure hypercholesterolemia   Hyponatremia   Hypomagnesemia   Sepsis due to pneumonia   Bacteremia due to methicillin resistant Staphylococcus aureus  Principal problem Sepsis due to MRSA bacteremia -blood cultures obtained on admission showing MRSA.  He has a history of MRSA bacteremia in 2023 felt to be due to diabetic toe infections.  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 [name]. Due to severe back pain he underwent imaging, MRI of the T and L-spine with nonspecific findings, no osteomyelitis or discitis.  2D echo and TEE without vegetations.  ID recommends total of 2 weeks of antibiotics  Active problems Right foot ulcer -at  the site of prior ray amputation.  Patient tells me that it is improving.  Plain imaging without osteomyelitis, wound looks good without any evidence of deep infection  Hyponatremia -overall stable, recheck tomorrow morning  Essential hypertension -hold Entresto, Coreg decreased due to soft blood pressures in the setting of infection  CKD 3A -baseline creatinine 1.2-1.5, creatinine at baseline  Hypomagnesemia -replenish magnesium as indicated and continue to monitor  A fib with RVR -rate controlled, continue Eliquis, Coreg.  Followed by cardiology as an outpatient  Chronic combined CHF -his EF has improved.  Hold Entresto in the setting of hypotension  Hyperlipidemia -continue statin  IDDM, poorly controlled, with hyperglycemia-continue glargine, sliding scale.  CBGs controlled  CBG (last 3)  Recent Labs    07/15/22 1738 07/15/22 2047 07/16/22 0736  GLUCAP 122* 150* 107*     Lab Results  Component Value Date   HGBA1C 9.1 (H) 07/13/2022     Scheduled Meds:  apixaban  5 mg Oral BID   atorvastatin  80 mg Oral Daily   budesonide (PULMICORT) nebulizer solution  0.5 mg Nebulization BID   busPIRone  10 mg Oral BID   carvedilol  3.125 mg Oral BID   clopidogrel  75 mg Oral Q breakfast   digoxin  125 mcg Oral Daily   famotidine  20 mg Oral BID   insulin aspart  0-20 Units Subcutaneous TID WC   insulin glargine-yfgn  20 Units Subcutaneous Daily   ipratropium-albuterol  3 mL Nebulization TID   lidocaine  1 patch Transdermal Daily   senna-docusate  2 tablet Oral BID   spironolactone  25 mg  Oral Daily   Continuous Infusions:  DAPTOmycin (CUBICIN) 650 mg in sodium chloride 0.9 % IVPB 650 mg (07/15/22 2040)   PRN Meds:.acetaminophen **OR** acetaminophen, cyclobenzaprine, HYDROmorphone (DILAUDID) injection, hydrOXYzine, Muscle Rub, ondansetron **OR** ondansetron (ZOFRAN) IV, mouth rinse, oxyCODONE  Current Outpatient Medications  Medication Instructions   apixaban (ELIQUIS) 5  mg, Oral, 2 times daily   aspirin EC 81 mg, Oral, Daily, Swallow whole.   atorvastatin (LIPITOR) 80 mg, Oral, Daily   Basaglar KwikPen 40 Units, Subcutaneous, Daily   busPIRone (BUSPAR) 10 mg, Oral, 2 times daily   carvedilol (COREG) 6.25 mg, Oral, 2 times daily   clopidogrel (PLAVIX) 75 mg, Oral, Daily with breakfast   digoxin (LANOXIN) 0.125 MG tablet Take 1 tablet (125 mcg total) by mouth once daily.   famotidine (PEPCID) 20 MG tablet Take 1 tablet (20 mg total) by mouth 2 times daily.   fluticasone (FLONASE) 50 MCG/ACT nasal spray 2 sprays, Each Nare, Daily   furosemide (LASIX) 40 mg, Oral, Daily   HumaLOG KwikPen 15 Units, Subcutaneous, 3 times daily   HYDROcodone-acetaminophen (NORCO/VICODIN) 5-325 MG tablet Take 1 tablet by mouth every 4 (four) hours as needed for up to 7 days for Moderate pain (4-6) or Severe pain (7-10).   sacubitril-valsartan (ENTRESTO) 49-51 MG 1 tablet, Oral, 2 times daily, Needs to see cardiology for refills   spironolactone (ALDACTONE) 25 mg, Oral, Daily    Diet Orders (From admission, onward)     Start     Ordered   07/15/22 1350  Diet Carb Modified Fluid consistency: Thin; Room service appropriate? Yes  Diet effective now       Question Answer Comment  Diet-HS Snack? Nothing   Calorie Level Medium 1600-2000   Fluid consistency: Thin   Room service appropriate? Yes      07/15/22 1349            DVT prophylaxis:  apixaban (ELIQUIS) tablet 5 mg   Lab Results  Component Value Date   PLT 302 07/15/2022      Code Status: Full Code  Family Communication: no family at bedside   Status is: Inpatient  Remains inpatient appropriate because: MRSA bacteremia  Level of care: Med-Surg  Consultants:  ID  Objective: Vitals:   07/15/22 1319 07/15/22 2046 07/16/22 0623 07/16/22 0853  BP: 130/84 (!) 141/84 (!) 116/93   Pulse: 79 89 (!) 104   Resp: 18 20 18    Temp: 97.7 F (36.5 C) 98.9 F (37.2 C) 98.9 F (37.2 C)   TempSrc: Oral      SpO2:  97% 97% 93%  Weight:      Height:        Intake/Output Summary (Last 24 hours) at 07/16/2022 1114 Last data filed at 07/16/2022 0600 Gross per 24 hour  Intake 1189.26 ml  Output 1225 ml  Net -35.74 ml    Wt Readings from Last 3 Encounters:  07/12/22 81 kg  07/09/22 81.6 kg  07/01/22 83.5 kg    Examination:  Constitutional: NAD Eyes: lids and conjunctivae normal, no scleral icterus ENMT: mmm Neck: normal, supple Respiratory: clear to auscultation bilaterally, no wheezing, no crackles. Normal respiratory effort.  Cardiovascular: Regular rate and rhythm, no murmurs / rubs / gallops. No LE edema. Abdomen: soft, no distention, no tenderness. Bowel sounds positive.  Skin: no rashes  Data Reviewed: I have independently reviewed following labs and imaging studies   CBC Recent Labs  Lab 07/09/22 1756 07/12/22 0850 07/13/22 0419 07/14/22 0525 07/15/22 0352  WBC 13.9* 17.0* 19.1* 22.6* 18.0*  HGB 13.9 15.2 12.3* 12.0* 13.5  HCT 40.0 44.9 36.7* 36.8* 40.6  PLT 180 248 205 270 302  MCV 92.2 92.4 94.3 95.6 94.4  MCH 32.0 31.3 31.6 31.2 31.4  MCHC 34.8 33.9 33.5 32.6 33.3  RDW 13.7 14.3 14.6 14.7 14.7  LYMPHSABS 0.4* 0.7  --   --   --   MONOABS 1.1* 1.3*  --   --   --   EOSABS 0.0 0.0  --   --   --   BASOSABS 0.1 0.1  --   --   --      Recent Labs  Lab 07/09/22 1756 07/12/22 0850 07/12/22 1050 07/12/22 1655 07/13/22 0410 07/13/22 0419 07/14/22 0525 07/15/22 0755  NA 125* 126*  --  126*  --  124* 125* 125*  K 3.9 4.8  --  5.0  --  4.8 4.7 4.9  CL 93* 90*  --  94*  --  93* 94* 92*  CO2 20* 24  --  23  --  22 20* 24  GLUCOSE 170* 375*  --  422*  --  437* 335* 160*  BUN 33* 35*  --  34*  --  35* 38* 30*  CREATININE 1.38* 1.41*  --  1.27*  --  1.32* 1.24 1.23  CALCIUM 8.8* 9.3  --  8.6*  --  8.3* 8.5* 9.0  AST 44* 46*  --   --   --  20 27  --   ALT 38 52*  --   --   --  33 36  --   ALKPHOS 98 171*  --   --   --  117 114  --   BILITOT 1.6* 1.4*  --   --    --  1.0 0.9  --   ALBUMIN 3.1* 3.0*  --   --   --  2.3* 2.5*  --   MG  --   --   --   --   --   --  2.1  --   LATICACIDVEN 1.5 3.8* 2.2*  --   --   --   --   --   HGBA1C  --   --   --   --  9.1*  --   --   --   BNP  --  319.6*  --   --   --   --   --   --      ------------------------------------------------------------------------------------------------------------------ No results for input(s): "CHOL", "HDL", "LDLCALC", "TRIG", "CHOLHDL", "LDLDIRECT" in the last 72 hours.  Lab Results  Component Value Date   HGBA1C 9.1 (H) 07/13/2022   ------------------------------------------------------------------------------------------------------------------ No results for input(s): "TSH", "T4TOTAL", "T3FREE", "THYROIDAB" in the last 72 hours.  Invalid input(s): "FREET3"  Cardiac Enzymes No results for input(s): "CKMB", "TROPONINI", "MYOGLOBIN" in the last 168 hours.  Invalid input(s): "CK" ------------------------------------------------------------------------------------------------------------------    Component Value Date/Time   BNP 319.6 (H) 07/12/2022 0850    CBG: Recent Labs  Lab 07/15/22 0807 07/15/22 1410 07/15/22 1738 07/15/22 2047 07/16/22 0736  GLUCAP 147* 109* 122* 150* 107*     Recent Results (from the past 240 hour(s))  Resp panel by RT-PCR (RSV, Flu A&B, Covid) Anterior Nasal Swab     Status: None   Collection Time: 07/09/22  5:56 PM   Specimen: Anterior Nasal Swab  Result Value Ref Range Status   SARS Coronavirus 2 by RT PCR NEGATIVE NEGATIVE Final  Comment: (NOTE) SARS-CoV-2 target nucleic acids are NOT DETECTED.  The SARS-CoV-2 RNA is generally detectable in upper respiratory specimens during the acute phase of infection. The lowest concentration of SARS-CoV-2 viral copies this assay can detect is 138 copies/mL. A negative result does not preclude SARS-Cov-2 infection and should not be used as the sole basis for treatment or other patient  management decisions. A negative result may occur with  improper specimen collection/handling, submission of specimen other than nasopharyngeal swab, presence of viral mutation(s) within the areas targeted by this assay, and inadequate number of viral copies(<138 copies/mL). A negative result must be combined with clinical observations, patient history, and epidemiological information. The expected result is Negative.  Fact Sheet for Patients:  BloggerCourse.com  Fact Sheet for Healthcare Providers:  SeriousBroker.it  This test is no t yet approved or cleared by the [place] FDA and  has been authorized for detection and/or diagnosis of SARS-CoV-2 by FDA under an Emergency Use Authorization (EUA). This EUA will remain  in effect (meaning this test can be used) for the duration of the COVID-19 declaration under Section 564(b)(1) of the Act, 21 U.S.C.section 360bbb-3(b)(1), unless the authorization is terminated  or revoked sooner.       Influenza A by PCR NEGATIVE NEGATIVE Final   Influenza B by PCR NEGATIVE NEGATIVE Final    Comment: (NOTE) The Xpert Xpress SARS-CoV-2/FLU/RSV plus assay is intended as an aid in the diagnosis of influenza from Nasopharyngeal swab specimens and should not be used as a sole basis for treatment. Nasal washings and aspirates are unacceptable for Xpert Xpress SARS-CoV-2/FLU/RSV testing.  Fact Sheet for Patients: BloggerCourse.com  Fact Sheet for Healthcare Providers: SeriousBroker.it  This test is not yet approved or cleared by the [place] FDA and has been authorized for detection and/or diagnosis of SARS-CoV-2 by FDA under an Emergency Use Authorization (EUA). This EUA will remain in effect (meaning this test can be used) for the duration of the COVID-19 declaration under Section 564(b)(1) of the Act, 21 U.S.C. section 360bbb-3(b)(1),  unless the authorization is terminated or revoked.     Resp Syncytial Virus by PCR NEGATIVE NEGATIVE Final    Comment: (NOTE) Fact Sheet for Patients: BloggerCourse.com  Fact Sheet for Healthcare Providers: SeriousBroker.it  This test is not yet approved or cleared by the [place] FDA and has been authorized for detection and/or diagnosis of SARS-CoV-2 by FDA under an Emergency Use Authorization (EUA). This EUA will remain in effect (meaning this test can be used) for the duration of the COVID-19 declaration under Section 564(b)(1) of the Act, 21 U.S.C. section 360bbb-3(b)(1), unless the authorization is terminated or revoked.  Performed at [facility], [address], [place], [place] [address]   Blood culture (routine x 2)     Status: Abnormal   Collection Time: 07/12/22  8:48 AM   Specimen: BLOOD RIGHT ARM  Result Value Ref Range Status   Specimen Description   Final    BLOOD RIGHT ARM Performed at [facility], [address], [place], [place] [address]    Special Requests   Final    BOTTLES DRAWN AEROBIC AND ANAEROBIC Blood Culture results may not be optimal due to an inadequate volume of blood received in culture bottles Performed at [facility], [address], [place], [place] [address]    Culture  Setup Time   Final    GRAM POSITIVE COCCI IN CLUSTERS IN BOTH AEROBIC AND ANAEROBIC BOTTLES CRITICAL VALUE  NOTED.  VALUE IS CONSISTENT WITH PREVIOUSLY REPORTED AND CALLED VALUE.    Culture (A)  Final    STAPHYLOCOCCUS AUREUS SUSCEPTIBILITIES PERFORMED ON PREVIOUS CULTURE WITHIN THE LAST 5 DAYS. Performed at [facility], [address] [place], [place] [address]    Report Status 07/14/2022 FINAL  Final  Blood culture (routine x 2)     Status: Abnormal (Preliminary result)   Collection Time: 07/12/22  8:50 AM   Specimen: BLOOD  Result Value Ref Range Status    Specimen Description   Final    BLOOD BLOOD LEFT ARM Performed at [facility], [address] [place], [place] [address]    Special Requests   Final    BOTTLES DRAWN AEROBIC AND ANAEROBIC Blood Culture results may not be optimal due to an inadequate volume of blood received in culture bottles Performed at [facility], [address] [place], [place] [address]    Culture  Setup Time   Final    GRAM POSITIVE COCCI IN CLUSTERS IN BOTH AEROBIC AND ANAEROBIC BOTTLES CRITICAL RESULT CALLED TO, READ BACK BY AND VERIFIED WITH: PHARMD M. Doran Durand 07/13/22 @ 0152 BY AB    Culture (A)  Final    METHICILLIN RESISTANT STAPHYLOCOCCUS AUREUS [facility] FOR DAPTOMYCIN Performed at [facility], [address] [place], [place] [address]    Report Status PENDING  Incomplete   Organism ID, Bacteria METHICILLIN RESISTANT STAPHYLOCOCCUS AUREUS  Final      Susceptibility   Methicillin resistant staphylococcus aureus - MIC*    CIPROFLOXACIN >=8 RESISTANT Resistant     ERYTHROMYCIN >=8 RESISTANT Resistant     GENTAMICIN <=0.5 SENSITIVE Sensitive     OXACILLIN >=4 RESISTANT Resistant     TETRACYCLINE <=1 SENSITIVE Sensitive     VANCOMYCIN <=0.5 SENSITIVE Sensitive     TRIMETH/SULFA >=320 RESISTANT Resistant     CLINDAMYCIN <=0.25 SENSITIVE Sensitive     RIFAMPIN <=0.5 SENSITIVE Sensitive     Inducible Clindamycin NEGATIVE Sensitive     * METHICILLIN RESISTANT STAPHYLOCOCCUS AUREUS  Blood Culture ID Panel (Reflexed)     Status: Abnormal   Collection Time: 07/12/22  8:50 AM  Result Value Ref Range Status   Enterococcus faecalis NOT DETECTED NOT DETECTED Final   Enterococcus Faecium NOT DETECTED NOT DETECTED Final   Listeria monocytogenes NOT DETECTED NOT DETECTED Final   Staphylococcus species DETECTED (A) NOT DETECTED Final    Comment: CRITICAL RESULT CALLED TO, READ BACK BY AND VERIFIED WITH: PHARMD M. LILLISTON 07/13/22 @ 0152 BY AB     Staphylococcus aureus (BCID) DETECTED (A) NOT DETECTED Final    Comment: Methicillin (oxacillin)-resistant Staphylococcus aureus (MRSA). MRSA is predictably resistant to beta-lactam antibiotics (except ceftaroline). Preferred therapy is vancomycin unless clinically contraindicated. Patient requires contact precautions if  hospitalized. CRITICAL RESULT CALLED TO, READ BACK BY AND VERIFIED WITH: PHARMD M. LILLISTON 07/13/22 @ 0152 BY AB    Staphylococcus epidermidis NOT DETECTED NOT DETECTED Final   Staphylococcus lugdunensis NOT DETECTED NOT DETECTED Final   Streptococcus species NOT DETECTED NOT DETECTED Final   Streptococcus agalactiae NOT DETECTED NOT DETECTED Final   Streptococcus pneumoniae NOT DETECTED NOT DETECTED Final   Streptococcus pyogenes NOT DETECTED NOT DETECTED Final   A.calcoaceticus-baumannii NOT DETECTED NOT DETECTED Final   Bacteroides fragilis NOT DETECTED NOT DETECTED Final   Enterobacterales NOT DETECTED NOT DETECTED Final   Enterobacter cloacae complex NOT DETECTED NOT DETECTED Final   Escherichia coli NOT DETECTED NOT DETECTED  Final   Klebsiella aerogenes NOT DETECTED NOT DETECTED Final   Klebsiella oxytoca NOT DETECTED NOT DETECTED Final   Klebsiella pneumoniae NOT DETECTED NOT DETECTED Final   Proteus species NOT DETECTED NOT DETECTED Final   Salmonella species NOT DETECTED NOT DETECTED Final   Serratia marcescens NOT DETECTED NOT DETECTED Final   Haemophilus influenzae NOT DETECTED NOT DETECTED Final   Neisseria meningitidis NOT DETECTED NOT DETECTED Final   Pseudomonas aeruginosa NOT DETECTED NOT DETECTED Final   Stenotrophomonas maltophilia NOT DETECTED NOT DETECTED Final   Candida albicans NOT DETECTED NOT DETECTED Final   Candida auris NOT DETECTED NOT DETECTED Final   Candida glabrata NOT DETECTED NOT DETECTED Final   Candida krusei NOT DETECTED NOT DETECTED Final   Candida parapsilosis NOT DETECTED NOT DETECTED Final   Candida tropicalis NOT DETECTED  NOT DETECTED Final   Cryptococcus neoformans/gattii NOT DETECTED NOT DETECTED Final   Meth resistant mecA/C and MREJ DETECTED (A) NOT DETECTED Final    Comment: CRITICAL RESULT CALLED TO, READ BACK BY AND VERIFIED WITH: PHARMD M. LILLISTON 07/13/22 @ 0152 BY AB Performed at [facility], [address] [place], [place] [address]   SARS Coronavirus 2 by RT PCR (hospital order, performed in [facility] hospital lab) *cepheid single result test* Anterior Nasal Swab     Status: None   Collection Time: 07/12/22  9:24 AM   Specimen: Anterior Nasal Swab  Result Value Ref Range Status   SARS Coronavirus 2 by RT PCR NEGATIVE NEGATIVE Final    Comment: (NOTE) SARS-CoV-2 target nucleic acids are NOT DETECTED.  The SARS-CoV-2 RNA is generally detectable in upper and lower respiratory specimens during the acute phase of infection. The lowest concentration of SARS-CoV-2 viral copies this assay can detect is 250 copies / mL. A negative result does not preclude SARS-CoV-2 infection and should not be used as the sole basis for treatment or other patient management decisions.  A negative result may occur with improper specimen collection / handling, submission of specimen other than nasopharyngeal swab, presence of viral mutation(s) within the areas targeted by this assay, and inadequate number of viral copies (<250 copies / mL). A negative result must be combined with clinical observations, patient history, and epidemiological information.  Fact Sheet for Patients:   RoadLapTop.co.za  Fact Sheet for Healthcare Providers: http://kim-miller.com/  This test is not yet approved or  cleared by the [place] FDA and has been authorized for detection and/or diagnosis of SARS-CoV-2 by FDA under an Emergency Use Authorization (EUA).  This EUA will remain in effect (meaning this test can be used) for the duration of the COVID-19 declaration under  Section 564(b)(1) of the Act, 21 U.S.C. section 360bbb-3(b)(1), unless the authorization is terminated or revoked sooner.  Performed at [facility], [address] [place], [place] [address]   Respiratory (~20 pathogens) panel by PCR     Status: None   Collection Time: 07/12/22  2:03 PM   Specimen: Nasopharyngeal Swab; Respiratory  Result Value Ref Range Status   Adenovirus NOT DETECTED NOT DETECTED Final   Coronavirus 229E NOT DETECTED NOT DETECTED Final    Comment: (NOTE) The Coronavirus on the Respiratory Panel, DOES NOT test for the novel  Coronavirus (2019 nCoV)    Coronavirus HKU1 NOT DETECTED NOT DETECTED Final   Coronavirus NL63 NOT DETECTED NOT DETECTED Final   Coronavirus OC43 NOT DETECTED NOT DETECTED Final   Metapneumovirus NOT DETECTED NOT DETECTED Final   Rhinovirus /  Enterovirus NOT DETECTED NOT DETECTED Final   Influenza A NOT DETECTED NOT DETECTED Final   Influenza B NOT DETECTED NOT DETECTED Final   Parainfluenza Virus 1 NOT DETECTED NOT DETECTED Final   Parainfluenza Virus 2 NOT DETECTED NOT DETECTED Final   Parainfluenza Virus 3 NOT DETECTED NOT DETECTED Final   Parainfluenza Virus 4 NOT DETECTED NOT DETECTED Final   Respiratory Syncytial Virus NOT DETECTED NOT DETECTED Final   Bordetella pertussis NOT DETECTED NOT DETECTED Final   Bordetella Parapertussis NOT DETECTED NOT DETECTED Final   Chlamydophila pneumoniae NOT DETECTED NOT DETECTED Final   Mycoplasma pneumoniae NOT DETECTED NOT DETECTED Final    Comment: Performed at [facility], [address] [place], [place] [address]  Minimum Inhibitory Conc. (1 Drug)     Status: None (Preliminary result)   Collection Time: 07/13/22  2:47 PM  Result Value Ref Range Status   Min Inhibitory Conc (1 Drug) Preliminary report  Final    Comment: (NOTE) Performed At: [facility] [address] [place], [place] [id number] Jolene Schimke MD AO:[id number]    Source CRE PENDING   Incomplete  Culture, blood (Routine X 2) w Reflex to ID Panel     Status: None (Preliminary result)   Collection Time: 07/14/22  5:25 AM   Specimen: BLOOD  Result Value Ref Range Status   Specimen Description   Final    BLOOD LEFT ANTECUBITAL Performed at [facility], [address], [place], [place] [address]    Special Requests   Final    BOTTLES DRAWN AEROBIC AND ANAEROBIC Blood Culture adequate volume Performed at [facility], [address], [place], [place] [address]    Culture   Final    NO GROWTH 2 DAYS Performed at [facility], [address], [place], [place] [address]    Report Status PENDING  Incomplete  Culture, blood (Routine X 2) w Reflex to ID Panel     Status: None (Preliminary result)   Collection Time: 07/14/22  5:25 AM   Specimen: BLOOD  Result Value Ref Range Status   Specimen Description   Final    BLOOD BLOOD LEFT HAND Performed at [facility], [address], [place], [place] [address]    Special Requests   Final    BOTTLES DRAWN AEROBIC AND ANAEROBIC Blood Culture adequate volume Performed at [facility], [address] [place], [place] [address]    Culture   Final    NO GROWTH 2 DAYS Performed at [facility], [address] [place], [place] [address]    Report Status PENDING  Incomplete     Radiology Studies: [place] EKG SITE RITE  Result Date: 07/16/2022 If Site Rite image not attached, placement could not be confirmed due to current cardiac rhythm.  ECHO TEE  Result Date: 07/15/2022    TRANSESOPHOGEAL ECHO REPORT   Patient Name:   David [name] Date of Exam: 07/15/2022 Medical Rec #:  [id number]   Height:       72.0 in Accession #:    [id number]  Weight:       178.6 lb Date of Birth:  Aug 07, 1960    BSA:          2.030 m Patient Age:    61 years    BP:           126/75 mmHg Patient Gender: M  HR:           87 bpm. Exam Location:  Inpatient Procedure:  Transesophageal Echo and Color Doppler Indications:     bacteremia  History:         Patient has prior history of Echocardiogram examinations, most                  recent 07/13/2022. Arrythmias:Atrial Fibrillation; Risk                  Factors:Hypertension, Diabetes and Dyslipidemia.  Sonographer:     Delcie Roch RDCS Referring Phys:  Judye Bos DUNN Diagnosing Phys: Thurmon Fair MD PROCEDURE: After discussion of the risks and benefits of a TEE, an informed consent was obtained from the patient. The transesophogeal probe was passed without difficulty through the esophogus of the patient. Imaged were obtained with the patient in a left lateral decubitus position. Sedation performed by different physician. The patient was monitored while under deep sedation. Anesthestetic sedation was provided intravenously by Anesthesiology: 76mg  of Propofol. The patient's vital signs; including heart rate, blood pressure, and oxygen saturation; remained stable throughout the procedure. The patient developed no complications during the procedure.  IMPRESSIONS  1. Left ventricular ejection fraction, by estimation, is 60 to 65%. The left ventricle has normal function. The left ventricle has no regional wall motion abnormalities.  2. Right ventricular systolic function is normal. The right ventricular size is normal.  3. Left atrial size was severely dilated. No left atrial/left atrial appendage thrombus was detected.  4. Right atrial size was severely dilated.  5. The mitral valve is normal in structure. Trivial mitral valve regurgitation. No evidence of mitral stenosis.  6. The aortic valve is normal in structure. Aortic valve regurgitation is not visualized. No aortic stenosis is present.  7. The inferior vena cava is normal in size with greater than 50% respiratory variability, suggesting right atrial pressure of 3 mmHg. Conclusion(s)/Recommendation(s): Normal biventricular function without evidence of hemodynamically  significant valvular heart disease. No evidence of vegetation/infective endocarditis on this transesophageael echocardiogram. FINDINGS  Left Ventricle: Left ventricular ejection fraction, by estimation, is 60 to 65%. The left ventricle has normal function. The left ventricle has no regional wall motion abnormalities. The left ventricular internal cavity size was normal in size. There is  no left ventricular hypertrophy. Right Ventricle: The right ventricular size is normal. No increase in right ventricular wall thickness. Right ventricular systolic function is normal. Left Atrium: Left atrial size was severely dilated. No left atrial/left atrial appendage thrombus was detected. Right Atrium: Right atrial size was severely dilated. Pericardium: There is no evidence of pericardial effusion. Mitral Valve: The mitral valve is normal in structure. Trivial mitral valve regurgitation. No evidence of mitral valve stenosis. Tricuspid Valve: The tricuspid valve is normal in structure. Tricuspid valve regurgitation is trivial. No evidence of tricuspid stenosis. Aortic Valve: The aortic valve is normal in structure. Aortic valve regurgitation is not visualized. No aortic stenosis is present. Pulmonic Valve: The pulmonic valve was normal in structure. Pulmonic valve regurgitation is not visualized. No evidence of pulmonic stenosis. Aorta: The aortic root is normal in size and structure. Venous: The inferior vena cava is normal in size with greater than 50% respiratory variability, suggesting right atrial pressure of 3 mmHg. IAS/Shunts: No atrial level shunt detected by color flow Doppler. Thurmon Fair MD Electronically signed by Thurmon Fair MD Signature Date/Time: 07/15/2022/2:11:54 PM    Final    EP STUDY  Result Date: 07/15/2022 See  surgical note for result.    Pamella Pert, MD, PhD [facility]  Between 7 am - 7 pm I am available, please contact me via Amion (for emergencies) or Securechat (non urgent  messages)  Between 7 pm - 7 am I am not available, please contact night coverage MD/APP via Amion

## 2022-07-16 NOTE — Plan of Care (Signed)
  Problem: Education: Goal: Knowledge of disease or condition will improve Outcome: Progressing Goal: Knowledge of the prescribed therapeutic regimen will improve Outcome: Progressing Goal: Individualized Educational Video(s) Outcome: Progressing   Problem: Activity: Goal: Ability to tolerate increased activity will improve Outcome: Progressing Goal: Will verbalize the importance of balancing activity with adequate rest periods Outcome: Progressing   Problem: Respiratory: Goal: Ability to maintain a clear airway will improve Outcome: Progressing Goal: Levels of oxygenation will improve Outcome: Progressing Goal: Ability to maintain adequate ventilation will improve Outcome: Progressing   Problem: Activity: Goal: Ability to tolerate increased activity will improve Outcome: Progressing   Problem: Clinical Measurements: Goal: Ability to maintain a body temperature in the normal range will improve Outcome: Progressing   Problem: Respiratory: Goal: Ability to maintain adequate ventilation will improve Outcome: Progressing Goal: Ability to maintain a clear airway will improve Outcome: Progressing   Problem: Education: Goal: Ability to describe self-care measures that may prevent or decrease complications (Diabetes Survival Skills Education) will improve Outcome: Progressing Goal: Individualized Educational Video(s) Outcome: Progressing   Problem: Coping: Goal: Ability to adjust to condition or change in health will improve Outcome: Progressing   Problem: Fluid Volume: Goal: Ability to maintain a balanced intake and output will improve Outcome: Progressing   Problem: Health Behavior/Discharge Planning: Goal: Ability to identify and utilize available resources and services will improve Outcome: Progressing Goal: Ability to manage health-related needs will improve Outcome: Progressing   Problem: Metabolic: Goal: Ability to maintain appropriate glucose levels will  improve Outcome: Progressing   Problem: Nutritional: Goal: Maintenance of adequate nutrition will improve Outcome: Progressing Goal: Progress toward achieving an optimal weight will improve Outcome: Progressing   Problem: Skin Integrity: Goal: Risk for impaired skin integrity will decrease Outcome: Progressing   Problem: Tissue Perfusion: Goal: Adequacy of tissue perfusion will improve Outcome: Progressing   Problem: Education: Goal: Knowledge of General Education information will improve Description: Including pain rating scale, medication(s)/side effects and non-pharmacologic comfort measures Outcome: Progressing   Problem: Health Behavior/Discharge Planning: Goal: Ability to manage health-related needs will improve Outcome: Progressing   Problem: Clinical Measurements: Goal: Ability to maintain clinical measurements within normal limits will improve Outcome: Progressing Goal: Will remain free from infection Outcome: Progressing Goal: Diagnostic test results will improve Outcome: Progressing Goal: Respiratory complications will improve Outcome: Progressing Goal: Cardiovascular complication will be avoided Outcome: Progressing   Problem: Activity: Goal: Risk for activity intolerance will decrease Outcome: Progressing   Problem: Nutrition: Goal: Adequate nutrition will be maintained Outcome: Progressing   Problem: Coping: Goal: Level of anxiety will decrease Outcome: Progressing   Problem: Elimination: Goal: Will not experience complications related to bowel motility Outcome: Progressing Goal: Will not experience complications related to urinary retention Outcome: Progressing   Problem: Pain Managment: Goal: General experience of comfort will improve Outcome: Progressing   Problem: Safety: Goal: Ability to remain free from injury will improve Outcome: Progressing   Problem: Skin Integrity: Goal: Risk for impaired skin integrity will decrease Outcome:  Progressing   

## 2022-07-16 NOTE — Progress Notes (Signed)
Peripherally Inserted Central Catheter Placement  The IV Nurse has discussed with the patient and/or persons authorized to consent for the patient, the purpose of this procedure and the potential benefits and risks involved with this procedure.  The benefits include less needle sticks, lab draws from the catheter, and the patient may be discharged home with the catheter. Risks include, but not limited to, infection, bleeding, blood clot (thrombus formation), and puncture of an artery; nerve damage and irregular heartbeat and possibility to perform a PICC exchange if needed/ordered by physician.  Alternatives to this procedure were also discussed.  B[employer] PICC patient education guide, fact sheet on infection prevention and patient information card has been provided to patient /or left at bedside.    PICC Placement Documentation  PICC Single Lumen 07/16/22 Right Brachial 40 cm 0 cm (Active)  Indication for Insertion or Continuance of Line Home intravenous therapies (PICC only) 07/16/22 1223  Exposed Catheter (cm) 0 cm 07/16/22 1223  Site Assessment Clean, Dry, Intact 07/16/22 1223  Line Status Saline locked;Flushed;Blood return noted 07/16/22 1223  Dressing Type Transparent;Securing device 07/16/22 1223  Dressing Status Antimicrobial disc in place;Clean, Dry, Intact 07/16/22 1223  Safety Lock Not Applicable 07/16/22 1223  Line Care Connections checked and tightened 07/16/22 1223  Line Adjustment (NICU/IV Team Only) No 07/16/22 1223  Dressing Intervention New dressing 07/16/22 1223  Dressing Change Due 07/23/22 07/16/22 1223       Burnard Bunting Chenice 07/16/2022, 12:24 PM

## 2022-07-16 NOTE — Progress Notes (Signed)
Patient refused bed alarm. Discussed safety concerns with patient. Patient told RN that he is not a fall risk and that the bed alarm was causing him anxiety. Reviewed and discussed with patient. Patient still wants bed alarm off.

## 2022-07-17 LAB — GLUCOSE, CAPILLARY
Glucose-Capillary: 196 mg/dL — ABNORMAL HIGH (ref 70–99)
Glucose-Capillary: 113 mg/dL — ABNORMAL HIGH (ref 70–99)
Glucose-Capillary: 134 mg/dL — ABNORMAL HIGH (ref 70–99)
Glucose-Capillary: 140 mg/dL — ABNORMAL HIGH (ref 70–99)

## 2022-07-17 LAB — MAGNESIUM: Magnesium: 2 mg/dL (ref 1.7–2.4)

## 2022-07-17 LAB — CBC
HCT: 36.9 % — ABNORMAL LOW (ref 39.0–52.0)
Hemoglobin: 12.4 g/dL — ABNORMAL LOW (ref 13.0–17.0)
MCH: 31.2 pg (ref 26.0–34.0)
MCHC: 33.6 g/dL (ref 30.0–36.0)
MCV: 92.9 fL (ref 80.0–100.0)
Platelets: 321 10*3/uL (ref 150–400)
RBC: 3.97 MIL/uL — ABNORMAL LOW (ref 4.22–5.81)
RDW: 14.2 % (ref 11.5–15.5)
WBC: 15.9 10*3/uL — ABNORMAL HIGH (ref 4.0–10.5)
nRBC: 0 % (ref 0.0–0.2)

## 2022-07-17 LAB — COMPREHENSIVE METABOLIC PANEL
ALT: 28 U/L (ref 0–44)
AST: 20 U/L (ref 15–41)
Albumin: 2.4 g/dL — ABNORMAL LOW (ref 3.5–5.0)
Alkaline Phosphatase: 115 U/L (ref 38–126)
Anion gap: 7 (ref 5–15)
BUN: 23 mg/dL — ABNORMAL HIGH (ref 6–20)
CO2: 23 mmol/L (ref 22–32)
Calcium: 8.8 mg/dL — ABNORMAL LOW (ref 8.9–10.3)
Chloride: 96 mmol/L — ABNORMAL LOW (ref 98–111)
Creatinine, Ser: 1.07 mg/dL (ref 0.61–1.24)
GFR, Estimated: >60 mL/min (ref >60)
Glucose, Bld: 241 mg/dL — ABNORMAL HIGH (ref 70–99)
Potassium: 4.3 mmol/L (ref 3.5–5.1)
Sodium: 126 mmol/L — ABNORMAL LOW (ref 135–145)
Total Bilirubin: 1.2 mg/dL (ref 0.3–1.2)
Total Protein: 6.7 g/dL (ref 6.5–8.1)

## 2022-07-17 MED ORDER — SODIUM CHLORIDE 0.9 % IV BOLUS
500.0000 mL | Freq: Once | INTRAVENOUS | Status: AC
  Administered 2022-07-17: 500 mL via INTRAVENOUS

## 2022-07-17 NOTE — Progress Notes (Signed)
Notified MD that patient's blood pressure was 91/71. Fluid bolus ordered for patient with successful blood pressure result 103/85. Patient stable, alert and oriented to person, place, time, and situation.

## 2022-07-17 NOTE — Plan of Care (Signed)
  Problem: Education: Goal: Knowledge of disease or condition will improve Outcome: Progressing Goal: Knowledge of the prescribed therapeutic regimen will improve Outcome: Progressing Goal: Individualized Educational Video(s) Outcome: Progressing   Problem: Activity: Goal: Ability to tolerate increased activity will improve Outcome: Progressing Goal: Will verbalize the importance of balancing activity with adequate rest periods Outcome: Progressing   Problem: Respiratory: Goal: Ability to maintain a clear airway will improve Outcome: Progressing Goal: Levels of oxygenation will improve Outcome: Progressing Goal: Ability to maintain adequate ventilation will improve Outcome: Progressing   Problem: Activity: Goal: Ability to tolerate increased activity will improve Outcome: Progressing   Problem: Clinical Measurements: Goal: Ability to maintain a body temperature in the normal range will improve Outcome: Progressing   Problem: Respiratory: Goal: Ability to maintain adequate ventilation will improve Outcome: Progressing Goal: Ability to maintain a clear airway will improve Outcome: Progressing   Problem: Education: Goal: Ability to describe self-care measures that may prevent or decrease complications (Diabetes Survival Skills Education) will improve Outcome: Progressing Goal: Individualized Educational Video(s) Outcome: Progressing   Problem: Coping: Goal: Ability to adjust to condition or change in health will improve Outcome: Progressing   Problem: Fluid Volume: Goal: Ability to maintain a balanced intake and output will improve Outcome: Progressing   Problem: Health Behavior/Discharge Planning: Goal: Ability to identify and utilize available resources and services will improve Outcome: Progressing Goal: Ability to manage health-related needs will improve Outcome: Progressing   Problem: Metabolic: Goal: Ability to maintain appropriate glucose levels will  improve Outcome: Progressing   Problem: Nutritional: Goal: Maintenance of adequate nutrition will improve Outcome: Progressing Goal: Progress toward achieving an optimal weight will improve Outcome: Progressing   Problem: Skin Integrity: Goal: Risk for impaired skin integrity will decrease Outcome: Progressing   Problem: Tissue Perfusion: Goal: Adequacy of tissue perfusion will improve Outcome: Progressing   Problem: Education: Goal: Knowledge of General Education information will improve Description: Including pain rating scale, medication(s)/side effects and non-pharmacologic comfort measures Outcome: Progressing   Problem: Health Behavior/Discharge Planning: Goal: Ability to manage health-related needs will improve Outcome: Progressing   Problem: Clinical Measurements: Goal: Ability to maintain clinical measurements within normal limits will improve Outcome: Progressing Goal: Will remain free from infection Outcome: Progressing Goal: Diagnostic test results will improve Outcome: Progressing Goal: Respiratory complications will improve Outcome: Progressing Goal: Cardiovascular complication will be avoided Outcome: Progressing   Problem: Activity: Goal: Risk for activity intolerance will decrease Outcome: Progressing   Problem: Nutrition: Goal: Adequate nutrition will be maintained Outcome: Progressing   Problem: Coping: Goal: Level of anxiety will decrease Outcome: Progressing   Problem: Elimination: Goal: Will not experience complications related to bowel motility Outcome: Progressing Goal: Will not experience complications related to urinary retention Outcome: Progressing   Problem: Pain Managment: Goal: General experience of comfort will improve Outcome: Progressing   Problem: Safety: Goal: Ability to remain free from injury will improve Outcome: Progressing   Problem: Skin Integrity: Goal: Risk for impaired skin integrity will decrease Outcome:  Progressing   

## 2022-07-17 NOTE — Progress Notes (Signed)
PROGRESS NOTE  David [name] GHW:[id number] DOB: [birth year] DOA: 07/12/2022 PCP: [name], MD   LOS: 5 days   Brief Narrative / Interim history: 62 y.o. 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 His appetite has been decreased. He had a fever at home of 105 F. He was having significant chills and rigors.  His rigors were so intense that produced the above-mentioned back pain.   Subjective / 24h Interval events: Back pain continues to be quite severe.  Feels like he is having difficulties to stay on top of it but oral oxycodone does work  Energy manager and Plan: Principal Problem:   Acute respiratory failure with hypoxia Active Problems:   Essential hypertension   Insulin dependent type 2 diabetes mellitus   Atrial fibrillation with RVR   Sleep apnea suspected   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)   Pure hypercholesterolemia   Hyponatremia   Hypomagnesemia   Sepsis due to pneumonia   Bacteremia due to methicillin resistant Staphylococcus aureus  Principal problem Sepsis due to MRSA bacteremia -blood cultures obtained on admission showing MRSA.  He has a history of MRSA bacteremia in 2023 felt to be due to diabetic toe infections.  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 [name]. Due to severe back pain he underwent imaging, MRI of the T and L-spine with nonspecific findings, no osteomyelitis or discitis.  2D echo and TEE without vegetations.  ID recommends total of 2 weeks of antibiotics.  PICC line placed, home health RN  arranged  Active problems Right foot ulcer -at the site of prior ray amputation.  Patient tells me that it is improving.  Plain imaging without osteomyelitis, wound looks good without any evidence of deep infection  Severe back pain -lumbar MRI with diffuse edema involving the lower posterior paraspinous musculature, nonspecific, acute myositis possible infectious versus inflammatory.  There is no loculated or drainable fluid collections.  Negative for discitis/osteomyelitis.  This is the main issue and he is still having difficulties controlling the pain.  Increased oxycodone to Q 3, continue muscle relaxers, if pain is controlled could go home tomorrow  Hyponatremia -overall stable, also hyperglycemia, corrected sodium 128 which is close to his baseline.  He has ranging between upper 120s low 130s for quite some time  Essential hypertension -continue Entresto, Coreg  CKD 3A -baseline creatinine 1.2-1.5, creatinine is at baseline  Hypomagnesemia -replenish magnesium as indicated and continue to monitor  A fib with RVR -rate controlled, continue Eliquis, Coreg.  Followed by cardiology as an outpatient  Chronic combined CHF -his EF has improved.  Continue home medications  Hyperlipidemia -continue statin  IDDM, poorly controlled, with hyperglycemia-continue glargine, sliding scale.  CBGs controlled  CBG (last 3)  Recent Labs    07/16/22 1434 07/16/22 1803 07/17/22 0741  GLUCAP 146* 165* 196*     Lab Results  Component Value Date   HGBA1C 9.1 (H) 07/13/2022     Scheduled Meds:  apixaban  5 mg Oral BID   atorvastatin  80 mg Oral Daily   budesonide (PULMICORT) nebulizer solution  0.5 mg Nebulization BID   busPIRone  10 mg Oral BID  carvedilol  3.125 mg Oral BID   Chlorhexidine Gluconate Cloth  6 each Topical Daily   clopidogrel  75 mg Oral Q breakfast   digoxin  125 mcg Oral Daily   famotidine  20 mg Oral BID   insulin aspart  0-20 Units Subcutaneous TID WC   insulin  glargine-yfgn  20 Units Subcutaneous Daily   ipratropium-albuterol  3 mL Nebulization TID   lidocaine  1 patch Transdermal Daily   sacubitril-valsartan  1 tablet Oral BID   senna-docusate  2 tablet Oral BID   sodium chloride flush  10-40 mL Intracatheter Q12H   spironolactone  25 mg Oral Daily   Continuous Infusions:  DAPTOmycin (CUBICIN) 650 mg in sodium chloride 0.9 % IVPB Stopped (07/16/22 2255)   PRN Meds:.acetaminophen **OR** acetaminophen, cyclobenzaprine, HYDROmorphone (DILAUDID) injection, hydrOXYzine, Muscle Rub, ondansetron **OR** ondansetron (ZOFRAN) IV, mouth rinse, oxyCODONE, sodium chloride flush  Current Outpatient Medications  Medication Instructions   apixaban (ELIQUIS) 5 mg, Oral, 2 times daily   aspirin EC 81 mg, Oral, Daily, Swallow whole.   atorvastatin (LIPITOR) 80 mg, Oral, Daily   Basaglar KwikPen 40 Units, Subcutaneous, Daily   busPIRone (BUSPAR) 10 mg, Oral, 2 times daily   carvedilol (COREG) 6.25 mg, Oral, 2 times daily   clopidogrel (PLAVIX) 75 mg, Oral, Daily with breakfast   daptomycin (CUBICIN) IVPB 650 mg, Intravenous, Every 24 hours, Indication:  MRSA bacteremia<BR>First Dose: Yes<BR>Last Day of Therapy:  07/28/22<BR>Labs - Once weekly:  CBC/D, BMP, and CPK<BR>Labs - Every other week:  ESR and CRP<BR>Method of administration: IV Push<BR>Method of administration may be changed at the discretion of home infusion pharmacist based upon assessment of the patient and/or caregiver's ability to self-administer the medication ordered.   digoxin (LANOXIN) 0.125 MG tablet Take 1 tablet (125 mcg total) by mouth once daily.   famotidine (PEPCID) 20 MG tablet Take 1 tablet (20 mg total) by mouth 2 times daily.   fluticasone (FLONASE) 50 MCG/ACT nasal spray 2 sprays, Each Nare, Daily   furosemide (LASIX) 40 mg, Oral, Daily   HumaLOG KwikPen 15 Units, Subcutaneous, 3 times daily   HYDROcodone-acetaminophen (NORCO/VICODIN) 5-325 MG tablet Take 1 tablet by mouth every 4  (four) hours as needed for up to 7 days for Moderate pain (4-6) or Severe pain (7-10).   sacubitril-valsartan (ENTRESTO) 49-51 MG 1 tablet, Oral, 2 times daily, Needs to see cardiology for refills   spironolactone (ALDACTONE) 25 mg, Oral, Daily    Diet Orders (From admission, onward)     Start     Ordered   07/15/22 1350  Diet Carb Modified Fluid consistency: Thin; Room service appropriate? Yes  Diet effective now       Question Answer Comment  Diet-HS Snack? Nothing   Calorie Level Medium 1600-2000   Fluid consistency: Thin   Room service appropriate? Yes      07/15/22 1349            DVT prophylaxis:  apixaban (ELIQUIS) tablet 5 mg   Lab Results  Component Value Date   PLT 321 07/17/2022      Code Status: Full Code  Family Communication: no family at bedside   Status is: Inpatient  Remains inpatient appropriate because: Back pain control  Level of care: Med-Surg  Consultants:  ID  Objective: Vitals:   07/16/22 1448 07/16/22 1646 07/16/22 1957 07/16/22 2100  BP:  (!) 148/87  127/87  Pulse:    90  Resp:  18  20  Temp:    99  F (37.2 C)  TempSrc:    Oral  SpO2: 91%  99% 99%  Weight:      Height:        Intake/Output Summary (Last 24 hours) at 07/17/2022 1029 Last data filed at 07/17/2022 1000 Gross per 24 hour  Intake 1223 ml  Output 3100 ml  Net -1877 ml    Wt Readings from Last 3 Encounters:  07/12/22 81 kg  07/09/22 81.6 kg  07/01/22 83.5 kg    Examination:  Constitutional: NAD Eyes: lids and conjunctivae normal, no scleral icterus ENMT: mmm Neck: normal, supple Respiratory: clear to auscultation bilaterally, no wheezing, no crackles. Normal respiratory effort.  Cardiovascular: Regular rate and rhythm, no murmurs / rubs / gallops. No LE edema. Abdomen: soft, no distention, no tenderness. Bowel sounds positive.  Skin: no rashes Neurologic: no focal deficits, equal strength  Data Reviewed: I have independently reviewed following labs  and imaging studies   CBC Recent Labs  Lab 07/12/22 0850 07/13/22 0419 07/14/22 0525 07/15/22 0755 07/17/22 0300  WBC 17.0* 19.1* 22.6* 18.0* 15.9*  HGB 15.2 12.3* 12.0* 13.5 12.4*  HCT 44.9 36.7* 36.8* 40.6 36.9*  PLT 248 205 270 302 321  MCV 92.4 94.3 95.6 94.4 92.9  MCH 31.3 31.6 31.2 31.4 31.2  MCHC 33.9 33.5 32.6 33.3 33.6  RDW 14.3 14.6 14.7 14.7 14.2  LYMPHSABS 0.7  --   --   --   --   MONOABS 1.3*  --   --   --   --   EOSABS 0.0  --   --   --   --   BASOSABS 0.1  --   --   --   --      Recent Labs  Lab 07/12/22 0850 07/12/22 1050 07/12/22 1655 07/13/22 0410 07/13/22 0419 07/14/22 0525 07/15/22 0755 07/17/22 0300  NA 126*  --  126*  --  124* 125* 125* 126*  K 4.8  --  5.0  --  4.8 4.7 4.9 4.3  CL 90*  --  94*  --  93* 94* 92* 96*  CO2 24  --  23  --  22 20* 24 23  GLUCOSE 375*  --  422*  --  437* 335* 160* 241*  BUN 35*  --  34*  --  35* 38* 30* 23*  CREATININE 1.41*  --  1.27*  --  1.32* 1.24 1.23 1.07  CALCIUM 9.3  --  8.6*  --  8.3* 8.5* 9.0 8.8*  AST 46*  --   --   --  20 27  --  20  ALT 52*  --   --   --  33 36  --  28  ALKPHOS 171*  --   --   --  117 114  --  115  BILITOT 1.4*  --   --   --  1.0 0.9  --  1.2  ALBUMIN 3.0*  --   --   --  2.3* 2.5*  --  2.4*  MG  --   --   --   --   --  2.1  --  2.0  LATICACIDVEN 3.8* 2.2*  --   --   --   --   --   --   HGBA1C  --   --   --  9.1*  --   --   --   --   BNP 319.6*  --   --   --   --   --   --   --      ------------------------------------------------------------------------------------------------------------------  No results for input(s): "CHOL", "HDL", "LDLCALC", "TRIG", "CHOLHDL", "LDLDIRECT" in the last 72 hours.  Lab Results  Component Value Date   HGBA1C 9.1 (H) 07/13/2022   ------------------------------------------------------------------------------------------------------------------ No results for input(s): "TSH", "T4TOTAL", "T3FREE", "THYROIDAB" in the last 72 hours.  Invalid  input(s): "FREET3"  Cardiac Enzymes No results for input(s): "CKMB", "TROPONINI", "MYOGLOBIN" in the last 168 hours.  Invalid input(s): "CK" ------------------------------------------------------------------------------------------------------------------    Component Value Date/Time   BNP 319.6 (H) 07/12/2022 0850    CBG: Recent Labs  Lab 07/15/22 2047 07/16/22 0736 07/16/22 1434 07/16/22 1803 07/17/22 0741  GLUCAP 150* 107* 146* 165* 196*     Recent Results (from the past 240 hour(s))  Resp panel by RT-PCR (RSV, Flu A&B, Covid) Anterior Nasal Swab     Status: None   Collection Time: 07/09/22  5:56 PM   Specimen: Anterior Nasal Swab  Result Value Ref Range Status   SARS Coronavirus 2 by RT PCR NEGATIVE NEGATIVE Final    Comment: (NOTE) SARS-CoV-2 target nucleic acids are NOT DETECTED.  The SARS-CoV-2 RNA is generally detectable in upper respiratory specimens during the acute phase of infection. The lowest concentration of SARS-CoV-2 viral copies this assay can detect is 138 copies/mL. A negative result does not preclude SARS-Cov-2 infection and should not be used as the sole basis for treatment or other patient management decisions. A negative result may occur with  improper specimen collection/handling, submission of specimen other than nasopharyngeal swab, presence of viral mutation(s) within the areas targeted by this assay, and inadequate number of viral copies(<138 copies/mL). A negative result must be combined with clinical observations, patient history, and epidemiological information. The expected result is Negative.  Fact Sheet for Patients:  BloggerCourse.com  Fact Sheet for Healthcare Providers:  SeriousBroker.it  This test is no t yet approved or cleared by the [place] FDA and  has been authorized for detection and/or diagnosis of SARS-CoV-2 by FDA under an Emergency Use Authorization (EUA). This  EUA will remain  in effect (meaning this test can be used) for the duration of the COVID-19 declaration under Section 564(b)(1) of the Act, 21 U.S.C.section 360bbb-3(b)(1), unless the authorization is terminated  or revoked sooner.       Influenza A by PCR NEGATIVE NEGATIVE Final   Influenza B by PCR NEGATIVE NEGATIVE Final    Comment: (NOTE) The Xpert Xpress SARS-CoV-2/FLU/RSV plus assay is intended as an aid in the diagnosis of influenza from Nasopharyngeal swab specimens and should not be used as a sole basis for treatment. Nasal washings and aspirates are unacceptable for Xpert Xpress SARS-CoV-2/FLU/RSV testing.  Fact Sheet for Patients: BloggerCourse.com  Fact Sheet for Healthcare Providers: SeriousBroker.it  This test is not yet approved or cleared by the [place] FDA and has been authorized for detection and/or diagnosis of SARS-CoV-2 by FDA under an Emergency Use Authorization (EUA). This EUA will remain in effect (meaning this test can be used) for the duration of the COVID-19 declaration under Section 564(b)(1) of the Act, 21 U.S.C. section 360bbb-3(b)(1), unless the authorization is terminated or revoked.     Resp Syncytial Virus by PCR NEGATIVE NEGATIVE Final    Comment: (NOTE) Fact Sheet for Patients: BloggerCourse.com  Fact Sheet for Healthcare Providers: SeriousBroker.it  This test is not yet approved or cleared by the [place] FDA and has been authorized for detection and/or diagnosis of SARS-CoV-2 by FDA under an Emergency Use Authorization (EUA). This EUA will remain in effect (meaning this test can be used) for  the duration of the COVID-19 declaration under Section 564(b)(1) of the Act, 21 U.S.C. section 360bbb-3(b)(1), unless the authorization is terminated or revoked.  Performed at [facility], [address], [place], [place] [address]   Blood culture (routine x 2)     Status: Abnormal   Collection Time: 07/12/22  8:48 AM   Specimen: BLOOD RIGHT ARM  Result Value Ref Range Status   Specimen Description   Final    BLOOD RIGHT ARM Performed at [facility], [address], [place], [place] [address]    Special Requests   Final    BOTTLES DRAWN AEROBIC AND ANAEROBIC Blood Culture results may not be optimal due to an inadequate volume of blood received in culture bottles Performed at [facility], [address] [place], [place] [address]    Culture  Setup Time   Final    GRAM POSITIVE COCCI IN CLUSTERS IN BOTH AEROBIC AND ANAEROBIC BOTTLES CRITICAL VALUE NOTED.  VALUE IS CONSISTENT WITH PREVIOUSLY REPORTED AND CALLED VALUE.    Culture (A)  Final    STAPHYLOCOCCUS AUREUS SUSCEPTIBILITIES PERFORMED ON PREVIOUS CULTURE WITHIN THE LAST 5 DAYS. Performed at [facility], [address] [place], [place] [address]    Report Status 07/14/2022 FINAL  Final  Blood culture (routine x 2)     Status: Abnormal (Preliminary result)   Collection Time: 07/12/22  8:50 AM   Specimen: BLOOD  Result Value Ref Range Status   Specimen Description   Final    BLOOD BLOOD LEFT ARM Performed at [facility], [address] [place], [place] [address]    Special Requests   Final    BOTTLES DRAWN AEROBIC AND ANAEROBIC Blood Culture results may not be optimal due to an inadequate volume of blood received in culture bottles Performed at [facility], [address] [place], [place] [address]    Culture  Setup Time   Final    GRAM POSITIVE COCCI IN CLUSTERS IN BOTH AEROBIC AND ANAEROBIC BOTTLES CRITICAL RESULT CALLED TO, READ BACK BY AND VERIFIED WITH: PHARMD M. Doran Durand 07/13/22 @ 0152 BY AB    Culture (A)  Final    METHICILLIN RESISTANT STAPHYLOCOCCUS AUREUS [facility] FOR DAPTOMYCIN Performed at [facility], [address] [place], [place] [address]    Report Status PENDING  Incomplete   Organism ID, Bacteria METHICILLIN RESISTANT STAPHYLOCOCCUS AUREUS  Final      Susceptibility   Methicillin resistant staphylococcus aureus - MIC*    CIPROFLOXACIN >=8 RESISTANT Resistant     ERYTHROMYCIN >=8 RESISTANT Resistant     GENTAMICIN <=0.5 SENSITIVE Sensitive     OXACILLIN >=4 RESISTANT Resistant     TETRACYCLINE <=1 SENSITIVE Sensitive     VANCOMYCIN <=0.5 SENSITIVE Sensitive     TRIMETH/SULFA >=320 RESISTANT Resistant     CLINDAMYCIN <=0.25 SENSITIVE Sensitive     RIFAMPIN <=0.5 SENSITIVE Sensitive     Inducible Clindamycin NEGATIVE Sensitive     * METHICILLIN RESISTANT STAPHYLOCOCCUS AUREUS  Blood Culture ID Panel (Reflexed)     Status: Abnormal   Collection Time: 07/12/22  8:50 AM  Result Value Ref Range Status   Enterococcus faecalis NOT DETECTED NOT DETECTED Final   Enterococcus Faecium NOT DETECTED NOT DETECTED Final   Listeria monocytogenes NOT DETECTED NOT DETECTED Final   Staphylococcus species DETECTED (A) NOT DETECTED Final    Comment: CRITICAL RESULT CALLED TO, READ BACK BY AND  VERIFIED WITH: PHARMD M. LILLISTON 07/13/22 @ 0152 BY AB    Staphylococcus aureus (BCID) DETECTED (A) NOT DETECTED Final    Comment: Methicillin (oxacillin)-resistant Staphylococcus aureus (MRSA). MRSA is predictably resistant to beta-lactam antibiotics (except ceftaroline). Preferred therapy is vancomycin unless clinically contraindicated. Patient requires contact precautions if  hospitalized. CRITICAL RESULT CALLED TO, READ BACK BY AND VERIFIED WITH: PHARMD M. LILLISTON 07/13/22 @ 0152 BY AB    Staphylococcus epidermidis NOT DETECTED NOT DETECTED Final   Staphylococcus lugdunensis NOT DETECTED NOT DETECTED Final   Streptococcus species NOT DETECTED NOT DETECTED Final   Streptococcus agalactiae NOT DETECTED NOT DETECTED Final   Streptococcus pneumoniae NOT DETECTED NOT DETECTED Final   Streptococcus pyogenes NOT  DETECTED NOT DETECTED Final   A.calcoaceticus-baumannii NOT DETECTED NOT DETECTED Final   Bacteroides fragilis NOT DETECTED NOT DETECTED Final   Enterobacterales NOT DETECTED NOT DETECTED Final   Enterobacter cloacae complex NOT DETECTED NOT DETECTED Final   Escherichia coli NOT DETECTED NOT DETECTED Final   Klebsiella aerogenes NOT DETECTED NOT DETECTED Final   Klebsiella oxytoca NOT DETECTED NOT DETECTED Final   Klebsiella pneumoniae NOT DETECTED NOT DETECTED Final   Proteus species NOT DETECTED NOT DETECTED Final   Salmonella species NOT DETECTED NOT DETECTED Final   Serratia marcescens NOT DETECTED NOT DETECTED Final   Haemophilus influenzae NOT DETECTED NOT DETECTED Final   Neisseria meningitidis NOT DETECTED NOT DETECTED Final   Pseudomonas aeruginosa NOT DETECTED NOT DETECTED Final   Stenotrophomonas maltophilia NOT DETECTED NOT DETECTED Final   Candida albicans NOT DETECTED NOT DETECTED Final   Candida auris NOT DETECTED NOT DETECTED Final   Candida glabrata NOT DETECTED NOT DETECTED Final   Candida krusei NOT DETECTED NOT DETECTED Final   Candida parapsilosis NOT DETECTED NOT DETECTED Final   Candida tropicalis NOT DETECTED NOT DETECTED Final   Cryptococcus neoformans/gattii NOT DETECTED NOT DETECTED Final   Meth resistant mecA/C and MREJ DETECTED (A) NOT DETECTED Final    Comment: CRITICAL RESULT CALLED TO, READ BACK BY AND VERIFIED WITH: PHARMD M. LILLISTON 07/13/22 @ 0152 BY AB Performed at [facility], [address] [place], [place] [address]   SARS Coronavirus 2 by RT PCR (hospital order, performed in [facility] hospital lab) *cepheid single result test* Anterior Nasal Swab     Status: None   Collection Time: 07/12/22  9:24 AM   Specimen: Anterior Nasal Swab  Result Value Ref Range Status   SARS Coronavirus 2 by RT PCR NEGATIVE NEGATIVE Final    Comment: (NOTE) SARS-CoV-2 target nucleic acids are NOT DETECTED.  The SARS-CoV-2 RNA is generally detectable in  upper and lower respiratory specimens during the acute phase of infection. The lowest concentration of SARS-CoV-2 viral copies this assay can detect is 250 copies / mL. A negative result does not preclude SARS-CoV-2 infection and should not be used as the sole basis for treatment or other patient management decisions.  A negative result may occur with improper specimen collection / handling, submission of specimen other than nasopharyngeal swab, presence of viral mutation(s) within the areas targeted by this assay, and inadequate number of viral copies (<250 copies / mL). A negative result must be combined with clinical observations, patient history, and epidemiological information.  Fact Sheet for Patients:   RoadLapTop.co.za  Fact Sheet for Healthcare Providers: http://kim-miller.com/  This test is not yet approved or  cleared by the [place] FDA and has been authorized for detection and/or diagnosis of SARS-CoV-2 by FDA under  an Emergency Use Authorization (EUA).  This EUA will remain in effect (meaning this test can be used) for the duration of the COVID-19 declaration under Section 564(b)(1) of the Act, 21 U.S.C. section 360bbb-3(b)(1), unless the authorization is terminated or revoked sooner.  Performed at [facility], [address] [place], [place] [address]   Respiratory (~20 pathogens) panel by PCR     Status: None   Collection Time: 07/12/22  2:03 PM   Specimen: Nasopharyngeal Swab; Respiratory  Result Value Ref Range Status   Adenovirus NOT DETECTED NOT DETECTED Final   Coronavirus 229E NOT DETECTED NOT DETECTED Final    Comment: (NOTE) The Coronavirus on the Respiratory Panel, DOES NOT test for the novel  Coronavirus (2019 nCoV)    Coronavirus HKU1 NOT DETECTED NOT DETECTED Final   Coronavirus NL63 NOT DETECTED NOT DETECTED Final   Coronavirus OC43 NOT DETECTED NOT DETECTED Final    Metapneumovirus NOT DETECTED NOT DETECTED Final   Rhinovirus / Enterovirus NOT DETECTED NOT DETECTED Final   Influenza A NOT DETECTED NOT DETECTED Final   Influenza B NOT DETECTED NOT DETECTED Final   Parainfluenza Virus 1 NOT DETECTED NOT DETECTED Final   Parainfluenza Virus 2 NOT DETECTED NOT DETECTED Final   Parainfluenza Virus 3 NOT DETECTED NOT DETECTED Final   Parainfluenza Virus 4 NOT DETECTED NOT DETECTED Final   Respiratory Syncytial Virus NOT DETECTED NOT DETECTED Final   Bordetella pertussis NOT DETECTED NOT DETECTED Final   Bordetella Parapertussis NOT DETECTED NOT DETECTED Final   Chlamydophila pneumoniae NOT DETECTED NOT DETECTED Final   Mycoplasma pneumoniae NOT DETECTED NOT DETECTED Final    Comment: Performed at [facility], [address], [place], [place] [address]  Minimum Inhibitory Conc. (1 Drug)     Status: None (Preliminary result)   Collection Time: 07/13/22  2:47 PM  Result Value Ref Range Status   Min Inhibitory Conc (1 Drug) Preliminary report  Final    Comment: (NOTE) Performed At: [facility] [address] [place], [place] [id number] Jolene Schimke MD WG:[id number]    Source CRE PENDING  Incomplete  Culture, blood (Routine X 2) w Reflex to ID Panel     Status: None (Preliminary result)   Collection Time: 07/14/22  5:25 AM   Specimen: BLOOD  Result Value Ref Range Status   Specimen Description   Final    BLOOD LEFT ANTECUBITAL Performed at [facility], [address] [place], [place] [address]    Special Requests   Final    BOTTLES DRAWN AEROBIC AND ANAEROBIC Blood Culture adequate volume Performed at [facility], [address] [place], [place] [address]    Culture   Final    NO GROWTH 3 DAYS Performed at [facility], [address], [place], [place] [address]    Report Status PENDING  Incomplete  Culture, blood (Routine X 2) w Reflex to ID Panel     Status: None (Preliminary  result)   Collection Time: 07/14/22  5:25 AM   Specimen: BLOOD  Result Value Ref Range Status   Specimen Description   Final    BLOOD BLOOD LEFT HAND Performed at [facility], [address] [place], [place] [address]    Special Requests   Final    BOTTLES DRAWN AEROBIC AND ANAEROBIC Blood Culture adequate volume Performed at [facility], [address], [place], [place] [address]    Culture   Final  NO GROWTH 3 DAYS Performed at [facility], [address], [place], [place] [address]    Report Status PENDING  Incomplete     Radiology Studies: DG CHEST PORT 1 VIEW  Result Date: 07/16/2022 CLINICAL DATA:  Status post PICC line placement. EXAM: PORTABLE CHEST 1 VIEW COMPARISON:  Chest x-ray dated 07/12/2022. FINDINGS: PICC line appears well positioned with tip at the level of the lower SVC/RIGHT atrium. Ill-defined opacity at the RIGHT lung base. Ground-glass opacities within the LEFT upper lung. Probable small bilateral pleural effusions. No pneumothorax is seen. IMPRESSION: 1. PICC line appears well positioned with tip at the level of the lower SVC/RIGHT atrium. 2. Ill-defined opacity at the RIGHT lung base, atelectasis versus pneumonia. 3. Ground-glass opacities within the LEFT upper lung, suspicious for multifocal pneumonia. Electronically Signed   By: Bary Richard M.D.   On: 07/16/2022 13:03     Pamella Pert, MD, PhD [facility]  Between 7 am - 7 pm I am available, please contact me via Amion (for emergencies) or Securechat (non urgent messages)  Between 7 pm - 7 am I am not available, please contact night coverage MD/APP via Amion

## 2022-07-18 ENCOUNTER — Other Ambulatory Visit (HOSPITAL_COMMUNITY): Payer: Self-pay

## 2022-07-18 LAB — CBC
HCT: 36 % — ABNORMAL LOW (ref 39.0–52.0)
Hemoglobin: 12.2 g/dL — ABNORMAL LOW (ref 13.0–17.0)
MCH: 31.3 pg (ref 26.0–34.0)
MCHC: 33.9 g/dL (ref 30.0–36.0)
MCV: 92.3 fL (ref 80.0–100.0)
Platelets: 353 10*3/uL (ref 150–400)
RBC: 3.9 MIL/uL — ABNORMAL LOW (ref 4.22–5.81)
RDW: 14 % (ref 11.5–15.5)
WBC: 14.7 10*3/uL — ABNORMAL HIGH (ref 4.0–10.5)
nRBC: 0 % (ref 0.0–0.2)

## 2022-07-18 LAB — BASIC METABOLIC PANEL
Anion gap: 7 (ref 5–15)
BUN: 21 mg/dL — ABNORMAL HIGH (ref 6–20)
CO2: 21 mmol/L — ABNORMAL LOW (ref 22–32)
Calcium: 8.7 mg/dL — ABNORMAL LOW (ref 8.9–10.3)
Chloride: 93 mmol/L — ABNORMAL LOW (ref 98–111)
Creatinine, Ser: 1.17 mg/dL (ref 0.61–1.24)
GFR, Estimated: >60 mL/min (ref >60)
Glucose, Bld: 172 mg/dL — ABNORMAL HIGH (ref 70–99)
Potassium: 4.1 mmol/L (ref 3.5–5.1)
Sodium: 121 mmol/L — ABNORMAL LOW (ref 135–145)

## 2022-07-18 LAB — GLUCOSE, CAPILLARY
Glucose-Capillary: 193 mg/dL — ABNORMAL HIGH (ref 70–99)
Glucose-Capillary: 166 mg/dL — ABNORMAL HIGH (ref 70–99)
Glucose-Capillary: 84 mg/dL (ref 70–99)
Glucose-Capillary: 175 mg/dL — ABNORMAL HIGH (ref 70–99)
Glucose-Capillary: 163 mg/dL — ABNORMAL HIGH (ref 70–99)

## 2022-07-18 LAB — SODIUM: Sodium: 121 mmol/L — ABNORMAL LOW (ref 135–145)

## 2022-07-18 MED ORDER — ORITAVANCIN DIPHOSPHATE 400 MG IV SOLR
1200.0000 mg | Freq: Once | INTRAVENOUS | Status: AC
  Administered 2022-07-18: 1200 mg via INTRAVENOUS
  Filled 2022-07-18: qty 120

## 2022-07-18 MED ORDER — GLYCERIN (LAXATIVE) 2 G RE SUPP
1.0000 | Freq: Every day | RECTAL | Status: DC | PRN
  Administered 2022-07-19 – 2022-08-17 (×3): 1 via RECTAL
  Filled 2022-07-18 (×10): qty 1

## 2022-07-18 MED ORDER — NYSTATIN 100000 UNIT/ML MT SUSP
5.0000 mL | Freq: Four times a day (QID) | OROMUCOSAL | Status: DC
  Administered 2022-07-18 – 2022-08-02 (×56): 500000 [IU] via ORAL
  Filled 2022-07-18 (×56): qty 5

## 2022-07-18 MED ORDER — SORBITOL 70 % SOLN: 960.0000 mL | TOPICAL_OIL | Freq: Every day | ORAL | Status: DC | PRN

## 2022-07-18 MED ORDER — OXYCODONE HCL 5 MG PO TABS
10.0000 mg | ORAL_TABLET | ORAL | Status: DC | PRN
  Administered 2022-07-18 – 2022-08-08 (×86): 10 mg via ORAL
  Filled 2022-07-18 (×89): qty 2

## 2022-07-18 MED ORDER — POLYETHYLENE GLYCOL 3350 17 G PO PACK
17.0000 g | PACK | Freq: Two times a day (BID) | ORAL | Status: DC
  Administered 2022-07-18 – 2022-07-25 (×14): 17 g via ORAL
  Filled 2022-07-18 (×15): qty 1

## 2022-07-18 MED ORDER — METHOCARBAMOL 500 MG PO TABS
500.0000 mg | ORAL_TABLET | Freq: Three times a day (TID) | ORAL | Status: DC | PRN
  Administered 2022-07-18 – 2022-07-29 (×16): 500 mg via ORAL
  Filled 2022-07-18 (×18): qty 1

## 2022-07-18 NOTE — Progress Notes (Signed)
Per patient, he has not had a BM since last Tuesday (07/11/2012). Abd is distended, pt is passing gas. Denies pain. MD made aware. Miralax given- pt denies need for suppository or enema at this time, but pt aware that these meds are ordered and available if he chooses.   Pt also c/o "sores" in the back of his throat. Tongue is white patchy and sore also. MD made aware. See new orders for Nystatin.

## 2022-07-18 NOTE — Progress Notes (Signed)
PROGRESS NOTE  David [name] ZOX:[id number] DOB: September 22, 1960 DOA: 07/12/2022 PCP: [name], MD   LOS: 6 days   Brief Narrative / Interim history: 62 y.o. 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 His appetite has been decreased. He had a fever at home of 105 F. He was having significant chills and rigors.  His rigors were so intense that produced the above-mentioned back pain.   Subjective / 24h Interval events: Complains of feeling like in a fog, he still has the back pain but tells me it is controlled  Assesement and Plan: Principal Problem:   Acute respiratory failure with hypoxia Active Problems:   Essential hypertension   Insulin dependent type 2 diabetes mellitus   Atrial fibrillation with RVR   Sleep apnea suspected   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)   Pure hypercholesterolemia   Hyponatremia   Hypomagnesemia   Sepsis due to pneumonia   Bacteremia due to methicillin resistant Staphylococcus aureus  Principal problem Sepsis due to MRSA bacteremia -blood cultures obtained on admission showing MRSA.  He has a history of MRSA bacteremia in 2023 felt to be due to diabetic toe infections.  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 [name]. Due to severe back pain he underwent imaging, MRI of the T and L-spine with nonspecific findings, no osteomyelitis or discitis.  2D echo and TEE without vegetations.  ID recommends total of 2 weeks of antibiotics.  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  Patient tells me that it is improving.  Plain imaging without osteomyelitis, wound looks good without any evidence of deep infection  Severe back pain -lumbar MRI with diffuse edema involving the lower posterior paraspinous musculature, nonspecific, acute myositis possible infectious versus inflammatory.  There is no loculated or drainable fluid collections.  Negative for discitis/osteomyelitis.  This is the main issue and he is still having difficulties controlling the pain.  Continue oxycodone, discontinue Flexeril as he reports that he is making him "loopy", switch to Robaxin  Hyponatremia -slightly worse today at 121, he is around 128-130 at baseline.  He tells me he drinks a lot of water including several pitchers a day.  Fluid restrict, continue to monitor  Essential hypertension -continue Entresto, Coreg  CKD 3A -baseline creatinine 1.2-1.5, creatinine is at baseline  Hypomagnesemia -replenish magnesium as indicated and continue to monitor  A fib with RVR -rate controlled, continue Eliquis, Coreg.  Followed by cardiology as an outpatient  Chronic combined CHF -his EF has improved.  Continue home medications  Hyperlipidemia -continue statin  IDDM, poorly controlled, with hyperglycemia-continue glargine, sliding scale.  CBGs controlled  CBG (last 3)  Recent Labs    07/17/22 2130 07/18/22 0740 07/18/22 1117  GLUCAP 140* 166* 84     Lab Results  Component Value Date   HGBA1C 9.1 (H) 07/13/2022     Scheduled Meds:  apixaban  5 mg Oral BID   atorvastatin  80 mg Oral Daily   budesonide (PULMICORT) nebulizer solution  0.5 mg Nebulization BID   busPIRone  10 mg Oral BID  carvedilol  3.125 mg Oral BID   Chlorhexidine Gluconate Cloth  6 each Topical Daily   clopidogrel  75 mg Oral Q breakfast   digoxin  125 mcg Oral Daily   famotidine  20 mg Oral BID   insulin aspart  0-20 Units Subcutaneous TID WC   insulin glargine-yfgn  20  Units Subcutaneous Daily   ipratropium-albuterol  3 mL Nebulization TID   lidocaine  1 patch Transdermal Daily   senna-docusate  2 tablet Oral BID   sodium chloride flush  10-40 mL Intracatheter Q12H   Continuous Infusions:  DAPTOmycin (CUBICIN) 650 mg in sodium chloride 0.9 % IVPB 650 mg (07/17/22 2051)   PRN Meds:.acetaminophen **OR** acetaminophen, HYDROmorphone (DILAUDID) injection, hydrOXYzine, methocarbamol, Muscle Rub, ondansetron **OR** ondansetron (ZOFRAN) IV, mouth rinse, oxyCODONE, sodium chloride flush  Current Outpatient Medications  Medication Instructions   apixaban (ELIQUIS) 5 mg, Oral, 2 times daily   aspirin EC 81 mg, Oral, Daily, Swallow whole.   atorvastatin (LIPITOR) 80 mg, Oral, Daily   Basaglar KwikPen 40 Units, Subcutaneous, Daily   busPIRone (BUSPAR) 10 mg, Oral, 2 times daily   carvedilol (COREG) 6.25 mg, Oral, 2 times daily   clopidogrel (PLAVIX) 75 mg, Oral, Daily with breakfast   daptomycin (CUBICIN) IVPB 650 mg, Intravenous, Every 24 hours, Indication:  MRSA bacteremia<BR>First Dose: Yes<BR>Last Day of Therapy:  07/28/22<BR>Labs - Once weekly:  CBC/D, BMP, and CPK<BR>Labs - Every other week:  ESR and CRP<BR>Method of administration: IV Push<BR>Method of administration may be changed at the discretion of home infusion pharmacist based upon assessment of the patient and/or caregiver's ability to self-administer the medication ordered.   digoxin (LANOXIN) 0.125 MG tablet Take 1 tablet (125 mcg total) by mouth once daily.   famotidine (PEPCID) 20 MG tablet Take 1 tablet (20 mg total) by mouth 2 times daily.   fluticasone (FLONASE) 50 MCG/ACT nasal spray 2 sprays, Each Nare, Daily   furosemide (LASIX) 40 mg, Oral, Daily   HumaLOG KwikPen 15 Units, Subcutaneous, 3 times daily   HYDROcodone-acetaminophen (NORCO/VICODIN) 5-325 MG tablet Take 1 tablet by mouth every 4 (four) hours as needed for up to 7 days for Moderate pain (4-6) or Severe pain (7-10).    sacubitril-valsartan (ENTRESTO) 49-51 MG 1 tablet, Oral, 2 times daily, Needs to see cardiology for refills   spironolactone (ALDACTONE) 25 mg, Oral, Daily    Diet Orders (From admission, onward)     Start     Ordered   07/18/22 0937  Diet Carb Modified Fluid consistency: Thin; Room service appropriate? Yes; Fluid restriction: 1500 mL Fluid  Diet effective now       Question Answer Comment  Diet-HS Snack? Nothing   Calorie Level Medium 1600-2000   Fluid consistency: Thin   Room service appropriate? Yes   Fluid restriction: 1500 mL Fluid      07/18/22 0936            DVT prophylaxis:  apixaban (ELIQUIS) tablet 5 mg   Lab Results  Component Value Date   PLT 353 07/18/2022      Code Status: Full Code  Family Communication: no family at bedside   Status is: Inpatient  Remains inpatient appropriate because: Back pain control  Level of care: Med-Surg  Consultants:  ID  Objective: Vitals:   07/17/22 1823 07/17/22 2132 07/18/22 0550 07/18/22 0801  BP: 122/81 117/82 100/78   Pulse: 79 92 90   Resp:  19 16   Temp:  98.9 F (37.2 C) 99.6 F (37.6  C)   TempSrc:  Oral Oral   SpO2:   95% 96%  Weight:      Height:        Intake/Output Summary (Last 24 hours) at 07/18/2022 1122 Last data filed at 07/18/2022 0451 Gross per 24 hour  Intake 980 ml  Output 1425 ml  Net -445 ml    Wt Readings from Last 3 Encounters:  07/12/22 81 kg  07/09/22 81.6 kg  07/01/22 83.5 kg    Examination: Constitutional: NAD Eyes: lids and conjunctivae normal, no scleral icterus ENMT: mmm Neck: normal, supple Respiratory: clear to auscultation bilaterally, no wheezing, no crackles. Normal respiratory effort.  Cardiovascular: Regular rate and rhythm, no murmurs / rubs / gallops. No LE edema. Abdomen: soft, no distention, no tenderness. Bowel sounds positive.   Data Reviewed: I have independently reviewed following labs and imaging studies   CBC Recent Labs  Lab 07/12/22 0850  07/13/22 0419 07/14/22 0525 07/15/22 0755 07/17/22 0300 07/18/22 0221  WBC 17.0* 19.1* 22.6* 18.0* 15.9* 14.7*  HGB 15.2 12.3* 12.0* 13.5 12.4* 12.2*  HCT 44.9 36.7* 36.8* 40.6 36.9* 36.0*  PLT 248 205 270 302 321 353  MCV 92.4 94.3 95.6 94.4 92.9 92.3  MCH 31.3 31.6 31.2 31.4 31.2 31.3  MCHC 33.9 33.5 32.6 33.3 33.6 33.9  RDW 14.3 14.6 14.7 14.7 14.2 14.0  LYMPHSABS 0.7  --   --   --   --   --   MONOABS 1.3*  --   --   --   --   --   EOSABS 0.0  --   --   --   --   --   BASOSABS 0.1  --   --   --   --   --      Recent Labs  Lab 07/12/22 0850 07/12/22 1050 07/12/22 1655 07/13/22 0410 07/13/22 0419 07/14/22 0525 07/15/22 0755 07/17/22 0300 07/18/22 0221  NA 126*  --    < >  --  124* 125* 125* 126* 121*  K 4.8  --    < >  --  4.8 4.7 4.9 4.3 4.1  CL 90*  --    < >  --  93* 94* 92* 96* 93*  CO2 24  --    < >  --  22 20* 24 23 21*  GLUCOSE 375*  --    < >  --  437* 335* 160* 241* 172*  BUN 35*  --    < >  --  35* 38* 30* 23* 21*  CREATININE 1.41*  --    < >  --  1.32* 1.24 1.23 1.07 1.17  CALCIUM 9.3  --    < >  --  8.3* 8.5* 9.0 8.8* 8.7*  AST 46*  --   --   --  20 27  --  20  --   ALT 52*  --   --   --  33 36  --  28  --   ALKPHOS 171*  --   --   --  117 114  --  115  --   BILITOT 1.4*  --   --   --  1.0 0.9  --  1.2  --   ALBUMIN 3.0*  --   --   --  2.3* 2.5*  --  2.4*  --   MG  --   --   --   --   --  2.1  --  2.0  --   LATICACIDVEN 3.8* 2.2*  --   --   --   --   --   --   --   HGBA1C  --   --   --  9.1*  --   --   --   --   --   BNP 319.6*  --   --   --   --   --   --   --   --    < > = values in this interval not displayed.     ------------------------------------------------------------------------------------------------------------------ No results for input(s): "CHOL", "HDL", "LDLCALC", "TRIG", "CHOLHDL", "LDLDIRECT" in the last 72 hours.  Lab Results  Component Value Date   HGBA1C 9.1 (H) 07/13/2022    ------------------------------------------------------------------------------------------------------------------ No results for input(s): "TSH", "T4TOTAL", "T3FREE", "THYROIDAB" in the last 72 hours.  Invalid input(s): "FREET3"  Cardiac Enzymes No results for input(s): "CKMB", "TROPONINI", "MYOGLOBIN" in the last 168 hours.  Invalid input(s): "CK" ------------------------------------------------------------------------------------------------------------------    Component Value Date/Time   BNP 319.6 (H) 07/12/2022 0850    CBG: Recent Labs  Lab 07/17/22 1152 07/17/22 1619 07/17/22 2130 07/18/22 0740 07/18/22 1117  GLUCAP 113* 134* 140* 166* 84     Recent Results (from the past 240 hour(s))  Resp panel by RT-PCR (RSV, Flu A&B, Covid) Anterior Nasal Swab     Status: None   Collection Time: 07/09/22  5:56 PM   Specimen: Anterior Nasal Swab  Result Value Ref Range Status   SARS Coronavirus 2 by RT PCR NEGATIVE NEGATIVE Final    Comment: (NOTE) SARS-CoV-2 target nucleic acids are NOT DETECTED.  The SARS-CoV-2 RNA is generally detectable in upper respiratory specimens during the acute phase of infection. The lowest concentration of SARS-CoV-2 viral copies this assay can detect is 138 copies/mL. A negative result does not preclude SARS-Cov-2 infection and should not be used as the sole basis for treatment or other patient management decisions. A negative result may occur with  improper specimen collection/handling, submission of specimen other than nasopharyngeal swab, presence of viral mutation(s) within the areas targeted by this assay, and inadequate number of viral copies(<138 copies/mL). A negative result must be combined with clinical observations, patient history, and epidemiological information. The expected result is Negative.  Fact Sheet for Patients:  BloggerCourse.com  Fact Sheet for Healthcare Providers:   SeriousBroker.it  This test is no t yet approved or cleared by the [place] FDA and  has been authorized for detection and/or diagnosis of SARS-CoV-2 by FDA under an Emergency Use Authorization (EUA). This EUA will remain  in effect (meaning this test can be used) for the duration of the COVID-19 declaration under Section 564(b)(1) of the Act, 21 U.S.C.section 360bbb-3(b)(1), unless the authorization is terminated  or revoked sooner.       Influenza A by PCR NEGATIVE NEGATIVE Final   Influenza B by PCR NEGATIVE NEGATIVE Final    Comment: (NOTE) The Xpert Xpress SARS-CoV-2/FLU/RSV plus assay is intended as an aid in the diagnosis of influenza from Nasopharyngeal swab specimens and should not be used as a sole basis for treatment. Nasal washings and aspirates are unacceptable for Xpert Xpress SARS-CoV-2/FLU/RSV testing.  Fact Sheet for Patients: BloggerCourse.com  Fact Sheet for Healthcare Providers: SeriousBroker.it  This test is not yet approved or cleared by the [place] FDA and has been authorized for detection and/or diagnosis of SARS-CoV-2 by FDA under an Emergency Use Authorization (EUA). This EUA will remain in effect (meaning this test can be  used) for the duration of the COVID-19 declaration under Section 564(b)(1) of the Act, 21 U.S.C. section 360bbb-3(b)(1), unless the authorization is terminated or revoked.     Resp Syncytial Virus by PCR NEGATIVE NEGATIVE Final    Comment: (NOTE) Fact Sheet for Patients: BloggerCourse.com  Fact Sheet for Healthcare Providers: SeriousBroker.it  This test is not yet approved or cleared by the [place] FDA and has been authorized for detection and/or diagnosis of SARS-CoV-2 by FDA under an Emergency Use Authorization (EUA). This EUA will remain in effect (meaning this test can be used) for  the duration of the COVID-19 declaration under Section 564(b)(1) of the Act, 21 U.S.C. section 360bbb-3(b)(1), unless the authorization is terminated or revoked.  Performed at [facility], [address] [place], [place] [address]   Blood culture (routine x 2)     Status: Abnormal   Collection Time: 07/12/22  8:48 AM   Specimen: BLOOD RIGHT ARM  Result Value Ref Range Status   Specimen Description   Final    BLOOD RIGHT ARM Performed at [facility], [address] [place], [place] [address]    Special Requests   Final    BOTTLES DRAWN AEROBIC AND ANAEROBIC Blood Culture results may not be optimal due to an inadequate volume of blood received in culture bottles Performed at [facility], [address] [place], [place] [address]    Culture  Setup Time   Final    GRAM POSITIVE COCCI IN CLUSTERS IN BOTH AEROBIC AND ANAEROBIC BOTTLES CRITICAL VALUE NOTED.  VALUE IS CONSISTENT WITH PREVIOUSLY REPORTED AND CALLED VALUE.    Culture (A)  Final    STAPHYLOCOCCUS AUREUS SUSCEPTIBILITIES PERFORMED ON PREVIOUS CULTURE WITHIN THE LAST 5 DAYS. Performed at [facility], [address] [place], [place] [address]    Report Status 07/14/2022 FINAL  Final  Blood culture (routine x 2)     Status: Abnormal (Preliminary result)   Collection Time: 07/12/22  8:50 AM   Specimen: BLOOD  Result Value Ref Range Status   Specimen Description   Final    BLOOD BLOOD LEFT ARM Performed at [facility], [address] [place], [place] [address]    Special Requests   Final    BOTTLES DRAWN AEROBIC AND ANAEROBIC Blood Culture results may not be optimal due to an inadequate volume of blood received in culture bottles Performed at [facility], [address] [place], [place] [address]    Culture  Setup Time   Final    GRAM POSITIVE COCCI IN CLUSTERS IN BOTH AEROBIC AND ANAEROBIC BOTTLES CRITICAL RESULT CALLED  TO, READ BACK BY AND VERIFIED WITH: PHARMD M. Doran Durand 07/13/22 @ 0152 BY AB    Culture (A)  Final    METHICILLIN RESISTANT STAPHYLOCOCCUS AUREUS [facility] FOR DAPTOMYCIN Performed at [facility], [address], [place], [place] [address]    Report Status PENDING  Incomplete   Organism ID, Bacteria METHICILLIN RESISTANT STAPHYLOCOCCUS AUREUS  Final      Susceptibility   Methicillin resistant staphylococcus aureus - MIC*    CIPROFLOXACIN >=8 RESISTANT Resistant     ERYTHROMYCIN >=8 RESISTANT Resistant     GENTAMICIN <=0.5 SENSITIVE Sensitive     OXACILLIN >=4 RESISTANT Resistant     TETRACYCLINE <=1 SENSITIVE Sensitive     VANCOMYCIN <=0.5 SENSITIVE Sensitive     TRIMETH/SULFA >=320 RESISTANT Resistant     CLINDAMYCIN <=0.25 SENSITIVE Sensitive  RIFAMPIN <=0.5 SENSITIVE Sensitive     Inducible Clindamycin NEGATIVE Sensitive     * METHICILLIN RESISTANT STAPHYLOCOCCUS AUREUS  Blood Culture ID Panel (Reflexed)     Status: Abnormal   Collection Time: 07/12/22  8:50 AM  Result Value Ref Range Status   Enterococcus faecalis NOT DETECTED NOT DETECTED Final   Enterococcus Faecium NOT DETECTED NOT DETECTED Final   Listeria monocytogenes NOT DETECTED NOT DETECTED Final   Staphylococcus species DETECTED (A) NOT DETECTED Final    Comment: CRITICAL RESULT CALLED TO, READ BACK BY AND VERIFIED WITH: PHARMD M. LILLISTON 07/13/22 @ 0152 BY AB    Staphylococcus aureus (BCID) DETECTED (A) NOT DETECTED Final    Comment: Methicillin (oxacillin)-resistant Staphylococcus aureus (MRSA). MRSA is predictably resistant to beta-lactam antibiotics (except ceftaroline). Preferred therapy is vancomycin unless clinically contraindicated. Patient requires contact precautions if  hospitalized. CRITICAL RESULT CALLED TO, READ BACK BY AND VERIFIED WITH: PHARMD M. LILLISTON 07/13/22 @ 0152 BY AB    Staphylococcus epidermidis NOT DETECTED NOT DETECTED Final   Staphylococcus lugdunensis NOT  DETECTED NOT DETECTED Final   Streptococcus species NOT DETECTED NOT DETECTED Final   Streptococcus agalactiae NOT DETECTED NOT DETECTED Final   Streptococcus pneumoniae NOT DETECTED NOT DETECTED Final   Streptococcus pyogenes NOT DETECTED NOT DETECTED Final   A.calcoaceticus-baumannii NOT DETECTED NOT DETECTED Final   Bacteroides fragilis NOT DETECTED NOT DETECTED Final   Enterobacterales NOT DETECTED NOT DETECTED Final   Enterobacter cloacae complex NOT DETECTED NOT DETECTED Final   Escherichia coli NOT DETECTED NOT DETECTED Final   Klebsiella aerogenes NOT DETECTED NOT DETECTED Final   Klebsiella oxytoca NOT DETECTED NOT DETECTED Final   Klebsiella pneumoniae NOT DETECTED NOT DETECTED Final   Proteus species NOT DETECTED NOT DETECTED Final   Salmonella species NOT DETECTED NOT DETECTED Final   Serratia marcescens NOT DETECTED NOT DETECTED Final   Haemophilus influenzae NOT DETECTED NOT DETECTED Final   Neisseria meningitidis NOT DETECTED NOT DETECTED Final   Pseudomonas aeruginosa NOT DETECTED NOT DETECTED Final   Stenotrophomonas maltophilia NOT DETECTED NOT DETECTED Final   Candida albicans NOT DETECTED NOT DETECTED Final   Candida auris NOT DETECTED NOT DETECTED Final   Candida glabrata NOT DETECTED NOT DETECTED Final   Candida krusei NOT DETECTED NOT DETECTED Final   Candida parapsilosis NOT DETECTED NOT DETECTED Final   Candida tropicalis NOT DETECTED NOT DETECTED Final   Cryptococcus neoformans/gattii NOT DETECTED NOT DETECTED Final   Meth resistant mecA/C and MREJ DETECTED (A) NOT DETECTED Final    Comment: CRITICAL RESULT CALLED TO, READ BACK BY AND VERIFIED WITH: PHARMD M. LILLISTON 07/13/22 @ 0152 BY AB Performed at [facility], [address], [place], [place] [address]   SARS Coronavirus 2 by RT PCR (hospital order, performed in [facility] hospital lab) *cepheid single result test* Anterior Nasal Swab     Status: None   Collection Time: 07/12/22  9:24 AM    Specimen: Anterior Nasal Swab  Result Value Ref Range Status   SARS Coronavirus 2 by RT PCR NEGATIVE NEGATIVE Final    Comment: (NOTE) SARS-CoV-2 target nucleic acids are NOT DETECTED.  The SARS-CoV-2 RNA is generally detectable in upper and lower respiratory specimens during the acute phase of infection. The lowest concentration of SARS-CoV-2 viral copies this assay can detect is 250 copies / mL. A negative result does not preclude SARS-CoV-2 infection and should not be used as the sole basis for treatment or other patient management decisions.  A  negative result may occur with improper specimen collection / handling, submission of specimen other than nasopharyngeal swab, presence of viral mutation(s) within the areas targeted by this assay, and inadequate number of viral copies (<250 copies / mL). A negative result must be combined with clinical observations, patient history, and epidemiological information.  Fact Sheet for Patients:   RoadLapTop.co.za  Fact Sheet for Healthcare Providers: http://kim-miller.com/  This test is not yet approved or  cleared by the [place] FDA and has been authorized for detection and/or diagnosis of SARS-CoV-2 by FDA under an Emergency Use Authorization (EUA).  This EUA will remain in effect (meaning this test can be used) for the duration of the COVID-19 declaration under Section 564(b)(1) of the Act, 21 U.S.C. section 360bbb-3(b)(1), unless the authorization is terminated or revoked sooner.  Performed at [facility], [address], [place], [place] [address]   Respiratory (~20 pathogens) panel by PCR     Status: None   Collection Time: 07/12/22  2:03 PM   Specimen: Nasopharyngeal Swab; Respiratory  Result Value Ref Range Status   Adenovirus NOT DETECTED NOT DETECTED Final   Coronavirus 229E NOT DETECTED NOT DETECTED Final    Comment: (NOTE) The Coronavirus on the  Respiratory Panel, DOES NOT test for the novel  Coronavirus (2019 nCoV)    Coronavirus HKU1 NOT DETECTED NOT DETECTED Final   Coronavirus NL63 NOT DETECTED NOT DETECTED Final   Coronavirus OC43 NOT DETECTED NOT DETECTED Final   Metapneumovirus NOT DETECTED NOT DETECTED Final   Rhinovirus / Enterovirus NOT DETECTED NOT DETECTED Final   Influenza A NOT DETECTED NOT DETECTED Final   Influenza B NOT DETECTED NOT DETECTED Final   Parainfluenza Virus 1 NOT DETECTED NOT DETECTED Final   Parainfluenza Virus 2 NOT DETECTED NOT DETECTED Final   Parainfluenza Virus 3 NOT DETECTED NOT DETECTED Final   Parainfluenza Virus 4 NOT DETECTED NOT DETECTED Final   Respiratory Syncytial Virus NOT DETECTED NOT DETECTED Final   Bordetella pertussis NOT DETECTED NOT DETECTED Final   Bordetella Parapertussis NOT DETECTED NOT DETECTED Final   Chlamydophila pneumoniae NOT DETECTED NOT DETECTED Final   Mycoplasma pneumoniae NOT DETECTED NOT DETECTED Final    Comment: Performed at [facility], [address] [place], [place] [address]  Minimum Inhibitory Conc. (1 Drug)     Status: None (Preliminary result)   Collection Time: 07/13/22  2:47 PM  Result Value Ref Range Status   Min Inhibitory Conc (1 Drug) Preliminary report  Final    Comment: (NOTE) Performed At: [facility] [address] [place], [place] [id number] Jolene Schimke MD [id number]    Source CRE PENDING  Incomplete  Culture, blood (Routine X 2) w Reflex to ID Panel     Status: None (Preliminary result)   Collection Time: 07/14/22  5:25 AM   Specimen: BLOOD  Result Value Ref Range Status   Specimen Description   Final    BLOOD LEFT ANTECUBITAL Performed at [facility], [address] [place], [place] [address]    Special Requests   Final    BOTTLES DRAWN AEROBIC AND ANAEROBIC Blood Culture adequate volume Performed at [facility], [address] [place], [place] [address]     Culture   Final    NO GROWTH 4 DAYS Performed at [facility], [address] [place], [place] [address]    Report Status PENDING  Incomplete  Culture, blood (Routine X 2) w Reflex to  ID Panel     Status: None (Preliminary result)   Collection Time: 07/14/22  5:25 AM   Specimen: BLOOD  Result Value Ref Range Status   Specimen Description   Final    BLOOD BLOOD LEFT HAND Performed at [facility], [address], [place], [place] [address]    Special Requests   Final    BOTTLES DRAWN AEROBIC AND ANAEROBIC Blood Culture adequate volume Performed at [facility], [address] [place], [place] [address]    Culture   Final    NO GROWTH 4 DAYS Performed at [facility], [address] [place], [place] [address]    Report Status PENDING  Incomplete     Radiology Studies: No results found.   Pamella Pert, MD, PhD [facility]  Between 7 am - 7 pm I am available, please contact me via Amion (for emergencies) or Securechat (non urgent messages)  Between 7 pm - 7 am I am not available, please contact night coverage MD/APP via Amion

## 2022-07-18 NOTE — TOC Progression Note (Addendum)
Transition of Care [facility]) - Progression Note    Patient Details  Name: David [name] MRN: [id number] Date of Birth: Aug 18, 1960  Transition of Care [facility]) CM/SW Contact  Larrie Kass, LCSW Phone Number: 07/18/2022, 9:34 AM  Clinical Narrative:    CSW spoke to Ashley with [employer] about charity home health services. She stated she will speak with the main office and contact CSW back. TOC to follow.  10:00am CSW received a call from [place] , she reported pt is working out financial obligation with infusion company and will follow up with CSW after an agreement has been made. TOC to follow.    ADDEN 1:35pm CSW received call from [facility] with [facility]. Per [name] pt owes infusion company 1700 and will have to pay before they can offer services. She reported pt has declined to pay. [employer] is not willing to provided home IV abx services to pt due to balance on account. [name] reported she will reach out to ID to look into different options. TOC to follow.    Expected Discharge Plan: Home w Home Health Services Barriers to Discharge: No Barriers Identified  Expected Discharge Plan and Services In-house Referral: Clinical Social Work Discharge Planning Services: NA Post Acute Care Choice: Home Health Living arrangements for the past 2 months: Hotel/Motel                 DME Arranged: IV pump/equipment DME Agency:  Julianne Rice) Date DME Agency Contacted: 07/16/22 Time DME Agency Contacted: 1430 Representative spoke with at DME Agency: Jeri Modena             Social Determinants of Health (SDOH) Interventions SDOH Screenings   Food Insecurity: No Food Insecurity (07/12/2022)  Housing: Low Risk  (07/12/2022)  Transportation Needs: No Transportation Needs (07/12/2022)  Utilities: Not At Risk (07/12/2022)  Depression (PHQ2-9): Low Risk  (11/12/2021)  Stress: Stress Concern Present (09/26/2017)  Tobacco Use: Medium Risk (07/12/2022)    Readmission Risk Interventions    09/06/2021    12:07 PM  Readmission Risk Prevention Plan  Post Dischage Appt Complete  Medication Screening Complete  Transportation Screening Complete

## 2022-07-19 ENCOUNTER — Encounter (HOSPITAL_COMMUNITY): Payer: Self-pay | Admitting: Cardiovascular Disease

## 2022-07-19 LAB — CULTURE, BLOOD (ROUTINE X 2)
Culture: NO GROWTH
Special Requests: ADEQUATE

## 2022-07-19 LAB — GLUCOSE, CAPILLARY
Glucose-Capillary: 145 mg/dL — ABNORMAL HIGH (ref 70–99)
Glucose-Capillary: 123 mg/dL — ABNORMAL HIGH (ref 70–99)
Glucose-Capillary: 145 mg/dL — ABNORMAL HIGH (ref 70–99)
Glucose-Capillary: 117 mg/dL — ABNORMAL HIGH (ref 70–99)

## 2022-07-19 LAB — BASIC METABOLIC PANEL
Anion gap: 6 (ref 5–15)
BUN: 22 mg/dL — ABNORMAL HIGH (ref 6–20)
CO2: 22 mmol/L (ref 22–32)
Calcium: 8.3 mg/dL — ABNORMAL LOW (ref 8.9–10.3)
Chloride: 94 mmol/L — ABNORMAL LOW (ref 98–111)
Creatinine, Ser: 1.13 mg/dL (ref 0.61–1.24)
GFR, Estimated: >60 mL/min (ref >60)
Glucose, Bld: 203 mg/dL — ABNORMAL HIGH (ref 70–99)
Potassium: 4.4 mmol/L (ref 3.5–5.1)
Sodium: 122 mmol/L — ABNORMAL LOW (ref 135–145)

## 2022-07-19 LAB — MINIMUM INHIBITORY CONC. (1 DRUG)

## 2022-07-19 LAB — MIC RESULT

## 2022-07-19 MED ORDER — IPRATROPIUM-ALBUTEROL 0.5-2.5 (3) MG/3ML IN SOLN
3.0000 mL | Freq: Two times a day (BID) | RESPIRATORY_TRACT | Status: DC
  Administered 2022-07-19 – 2022-07-20 (×3): 3 mL via RESPIRATORY_TRACT
  Filled 2022-07-19 (×3): qty 3

## 2022-07-19 NOTE — Progress Notes (Signed)
PROGRESS NOTE  David [name] ZOX:[id number] DOB: 1961-03-11 DOA: 07/12/2022 PCP: [name], MD   LOS: 7 days   Brief Narrative / Interim history: 62 y.o. 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 His appetite has been decreased. He had a fever at home of 105 F. He was having significant chills and rigors.  His rigors were so intense that produced the above-mentioned back pain.   Subjective / 24h Interval events: Feeling better, still has some back pain but has been taking less pain medications and feels less foggy.  Reports that he has been compliant with water restriction  Assesement and Plan: Principal Problem:   Acute respiratory failure with hypoxia Active Problems:   Essential hypertension   Insulin dependent type 2 diabetes mellitus   Atrial fibrillation with RVR   Sleep apnea suspected   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)   Pure hypercholesterolemia   Hyponatremia   Hypomagnesemia   Sepsis due to pneumonia   Bacteremia due to methicillin resistant Staphylococcus aureus  Principal problem Sepsis due to MRSA bacteremia -blood cultures obtained on admission showing MRSA.  He has a history of MRSA bacteremia in 2023 felt to be due to diabetic toe infections.  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 [name]. Due to severe back pain he underwent imaging, MRI of the T and L-spine with nonspecific findings, no osteomyelitis or discitis.  2D echo and TEE without vegetations.  ID recommends total of 2 weeks of antibiotics.   PICC line placed, and there were plans in place for him to get home health, however due to financial reasons this could not be arranged and patient received a dose of oritavancin on 4/15.  PICC can be removed upon discharge  Active problems Right foot ulcer -at the site of prior ray amputation.  Patient tells me that it is improving.  Plain imaging without osteomyelitis, wound looks good without any evidence of deep infection  Severe back pain -lumbar MRI with diffuse edema involving the lower posterior paraspinous musculature, nonspecific, acute myositis possible infectious versus inflammatory.  There is no loculated or drainable fluid collections.  Negative for discitis/osteomyelitis.  Continue oxycodone, Flexeril made him loopy and he was switched to Robaxin  Hyponatremia -stable for most part but with sudden worsening of 121 on 4/15, after a bolus of fluids for hypotension (possibly induced by pain medications).  He also tells me he drinks large amounts of water several pitchers per day.  He was placed on water restriction, sodium slightly better, continue to monitor.  His diuretics were also discontinued.  Baseline is around 128-130.  If he gets in the upper 120s could potentially go home tomorrow  Essential hypertension -continue his Sherryll Burger was held due to hypotension, Coreg dose decreased.  CKD 3A -baseline creatinine 1.2-1.5, creatinine is at baseline  Hypomagnesemia -replenish magnesium as indicated and continue to monitor  A fib -rate controlled, continue Eliquis, Coreg.  Followed by cardiology as an outpatient  Chronic combined CHF -his EF has improved and now normalized.  Diuretics were discontinued due to his hyponatremia, continue fluid restriction, Entresto also held due to hypotension.  He probably  needs to stay off of his heart meds on discharge at least for short amount of time and have close outpatient follow-up  Hyperlipidemia -continue statin  IDDM, poorly controlled, with  hyperglycemia-continue glargine, sliding scale.  CBGs controlled  CBG (last 3)  Recent Labs    07/18/22 2258 07/19/22 0752 07/19/22 1153  GLUCAP 163* 145* 123*     Lab Results  Component Value Date   HGBA1C 9.1 (H) 07/13/2022     Scheduled Meds:  apixaban  5 mg Oral BID   atorvastatin  80 mg Oral Daily   budesonide (PULMICORT) nebulizer solution  0.5 mg Nebulization BID   busPIRone  10 mg Oral BID   carvedilol  3.125 mg Oral BID   Chlorhexidine Gluconate Cloth  6 each Topical Daily   clopidogrel  75 mg Oral Q breakfast   digoxin  125 mcg Oral Daily   famotidine  20 mg Oral BID   insulin aspart  0-20 Units Subcutaneous TID WC   insulin glargine-yfgn  20 Units Subcutaneous Daily   ipratropium-albuterol  3 mL Nebulization BID   lidocaine  1 patch Transdermal Daily   nystatin  5 mL Oral QID   polyethylene glycol  17 g Oral BID   senna-docusate  2 tablet Oral BID   sodium chloride flush  10-40 mL Intracatheter Q12H   Continuous Infusions:   PRN Meds:.acetaminophen **OR** acetaminophen, Glycerin (Adult), HYDROmorphone (DILAUDID) injection, hydrOXYzine, methocarbamol, Muscle Rub, ondansetron **OR** ondansetron (ZOFRAN) IV, mouth rinse, oxyCODONE, sodium chloride flush, sorbitol, milk of mag, mineral oil, glycerin (SMOG) enema  Current Outpatient Medications  Medication Instructions   apixaban (ELIQUIS) 5 mg, Oral, 2 times daily   aspirin EC 81 mg, Oral, Daily, Swallow whole.   atorvastatin (LIPITOR) 80 mg, Oral, Daily   Basaglar KwikPen 40 Units, Subcutaneous, Daily   busPIRone (BUSPAR) 10 mg, Oral, 2 times daily   carvedilol (COREG) 6.25 mg, Oral, 2 times daily   clopidogrel (PLAVIX) 75 mg, Oral, Daily with breakfast   digoxin (LANOXIN) 0.125 MG tablet Take 1 tablet (125 mcg total) by mouth once daily.   famotidine (PEPCID) 20 MG tablet Take 1 tablet (20 mg total) by mouth 2 times daily.   fluticasone (FLONASE) 50 MCG/ACT nasal spray 2 sprays, Each Nare, Daily    furosemide (LASIX) 40 mg, Oral, Daily   HumaLOG KwikPen 15 Units, Subcutaneous, 3 times daily   HYDROcodone-acetaminophen (NORCO/VICODIN) 5-325 MG tablet Take 1 tablet by mouth every 4 (four) hours as needed for up to 7 days for Moderate pain (4-6) or Severe pain (7-10).   sacubitril-valsartan (ENTRESTO) 49-51 MG 1 tablet, Oral, 2 times daily, Needs to see cardiology for refills   spironolactone (ALDACTONE) 25 mg, Oral, Daily    Diet Orders (From admission, onward)     Start     Ordered   07/18/22 0937  Diet Carb Modified Fluid consistency: Thin; Room service appropriate? Yes; Fluid restriction: 1500 mL Fluid  Diet effective now       Question Answer Comment  Diet-HS Snack? Nothing   Calorie Level Medium 1600-2000   Fluid consistency: Thin   Room service appropriate? Yes   Fluid restriction: 1500 mL Fluid      07/18/22 0936            DVT prophylaxis:  apixaban (ELIQUIS) tablet 5 mg   Lab Results  Component Value Date   PLT 353 07/18/2022      Code Status: Full Code  Family Communication: no family at bedside  Status is: Inpatient  Remains inpatient appropriate because: Hyponatremia  Level of care: Med-Surg  Consultants:  ID  Objective: Vitals:   07/18/22 2108 07/18/22 2210 07/19/22 0558 07/19/22 1152  BP: 118/80  (!) 131/114 100/75  Pulse: 94  99 82  Resp: 18   20  Temp: 99.3 F (37.4 C)  99.4 F (37.4 C) 99.1 F (37.3 C)  TempSrc: Oral  Oral Oral  SpO2: 100% 99% 97% 100%  Weight:      Height:        Intake/Output Summary (Last 24 hours) at 07/19/2022 1200 Last data filed at 07/19/2022 0524 Gross per 24 hour  Intake 960 ml  Output 1750 ml  Net -790 ml    Wt Readings from Last 3 Encounters:  07/12/22 81 kg  07/09/22 81.6 kg  07/01/22 83.5 kg    Examination: Constitutional: NAD Eyes: lids and conjunctivae normal, no scleral icterus ENMT: mmm Neck: normal, supple Respiratory: clear to auscultation bilaterally, no wheezing, no crackles.  Normal respiratory effort.  Cardiovascular: Regular rate and rhythm, no murmurs / rubs / gallops. No LE edema. Abdomen: soft, no distention, no tenderness. Bowel sounds positive.  Skin: no rashes Neurologic: no focal deficits, equal strength  Data Reviewed: I have independently reviewed following labs and imaging studies   CBC Recent Labs  Lab 07/13/22 0419 07/14/22 0525 07/15/22 0755 07/17/22 0300 07/18/22 0221  WBC 19.1* 22.6* 18.0* 15.9* 14.7*  HGB 12.3* 12.0* 13.5 12.4* 12.2*  HCT 36.7* 36.8* 40.6 36.9* 36.0*  PLT 205 270 302 321 353  MCV 94.3 95.6 94.4 92.9 92.3  MCH 31.6 31.2 31.4 31.2 31.3  MCHC 33.5 32.6 33.3 33.6 33.9  RDW 14.6 14.7 14.7 14.2 14.0     Recent Labs  Lab 07/13/22 0410 07/13/22 0419 07/14/22 0525 07/15/22 0755 07/17/22 0300 07/18/22 0221 07/18/22 2002 07/19/22 0423  NA  --  124* 125* 125* 126* 121* 121* 122*  K  --  4.8 4.7 4.9 4.3 4.1  --  4.4  CL  --  93* 94* 92* 96* 93*  --  94*  CO2  --  22 20* 24 23 21*  --  22  GLUCOSE  --  437* 335* 160* 241* 172*  --  203*  BUN  --  35* 38* 30* 23* 21*  --  22*  CREATININE  --  1.32* 1.24 1.23 1.07 1.17  --  1.13  CALCIUM  --  8.3* 8.5* 9.0 8.8* 8.7*  --  8.3*  AST  --  20 27  --  20  --   --   --   ALT  --  33 36  --  28  --   --   --   ALKPHOS  --  117 114  --  115  --   --   --   BILITOT  --  1.0 0.9  --  1.2  --   --   --   ALBUMIN  --  2.3* 2.5*  --  2.4*  --   --   --   MG  --   --  2.1  --  2.0  --   --   --   HGBA1C 9.1*  --   --   --   --   --   --   --      ------------------------------------------------------------------------------------------------------------------ No results for input(s): "CHOL", "HDL", "LDLCALC", "TRIG", "CHOLHDL", "LDLDIRECT" in the last 72 hours.  Lab Results  Component Value  Date   HGBA1C 9.1 (H) 07/13/2022   ------------------------------------------------------------------------------------------------------------------ No results for input(s): "TSH",  "T4TOTAL", "T3FREE", "THYROIDAB" in the last 72 hours.  Invalid input(s): "FREET3"  Cardiac Enzymes No results for input(s): "CKMB", "TROPONINI", "MYOGLOBIN" in the last 168 hours.  Invalid input(s): "CK" ------------------------------------------------------------------------------------------------------------------    Component Value Date/Time   BNP 319.6 (H) 07/12/2022 0850    CBG: Recent Labs  Lab 07/18/22 1117 07/18/22 1614 07/18/22 2258 07/19/22 0752 07/19/22 1153  GLUCAP 84 175* 163* 145* 123*     Recent Results (from the past 240 hour(s))  Resp panel by RT-PCR (RSV, Flu A&B, Covid) Anterior Nasal Swab     Status: None   Collection Time: 07/09/22  5:56 PM   Specimen: Anterior Nasal Swab  Result Value Ref Range Status   SARS Coronavirus 2 by RT PCR NEGATIVE NEGATIVE Final    Comment: (NOTE) SARS-CoV-2 target nucleic acids are NOT DETECTED.  The SARS-CoV-2 RNA is generally detectable in upper respiratory specimens during the acute phase of infection. The lowest concentration of SARS-CoV-2 viral copies this assay can detect is 138 copies/mL. A negative result does not preclude SARS-Cov-2 infection and should not be used as the sole basis for treatment or other patient management decisions. A negative result may occur with  improper specimen collection/handling, submission of specimen other than nasopharyngeal swab, presence of viral mutation(s) within the areas targeted by this assay, and inadequate number of viral copies(<138 copies/mL). A negative result must be combined with clinical observations, patient history, and epidemiological information. The expected result is Negative.  Fact Sheet for Patients:  BloggerCourse.com  Fact Sheet for Healthcare Providers:  SeriousBroker.it  This test is no t yet approved or cleared by the [place] FDA and  has been authorized for detection and/or diagnosis of  SARS-CoV-2 by FDA under an Emergency Use Authorization (EUA). This EUA will remain  in effect (meaning this test can be used) for the duration of the COVID-19 declaration under Section 564(b)(1) of the Act, 21 U.S.C.section 360bbb-3(b)(1), unless the authorization is terminated  or revoked sooner.       Influenza A by PCR NEGATIVE NEGATIVE Final   Influenza B by PCR NEGATIVE NEGATIVE Final    Comment: (NOTE) The Xpert Xpress SARS-CoV-2/FLU/RSV plus assay is intended as an aid in the diagnosis of influenza from Nasopharyngeal swab specimens and should not be used as a sole basis for treatment. Nasal washings and aspirates are unacceptable for Xpert Xpress SARS-CoV-2/FLU/RSV testing.  Fact Sheet for Patients: BloggerCourse.com  Fact Sheet for Healthcare Providers: SeriousBroker.it  This test is not yet approved or cleared by the [place] FDA and has been authorized for detection and/or diagnosis of SARS-CoV-2 by FDA under an Emergency Use Authorization (EUA). This EUA will remain in effect (meaning this test can be used) for the duration of the COVID-19 declaration under Section 564(b)(1) of the Act, 21 U.S.C. section 360bbb-3(b)(1), unless the authorization is terminated or revoked.     Resp Syncytial Virus by PCR NEGATIVE NEGATIVE Final    Comment: (NOTE) Fact Sheet for Patients: BloggerCourse.com  Fact Sheet for Healthcare Providers: SeriousBroker.it  This test is not yet approved or cleared by the [place] FDA and has been authorized for detection and/or diagnosis of SARS-CoV-2 by FDA under an Emergency Use Authorization (EUA). This EUA will remain in effect (meaning this test can be used) for the duration of the COVID-19 declaration under Section 564(b)(1) of the Act, 21 U.S.C. section 360bbb-3(b)(1), unless the authorization is terminated  or revoked.  Performed at [facility], [address], [place], [place] [address]   Blood culture (routine x 2)     Status: Abnormal   Collection Time: 07/12/22  8:48 AM   Specimen: BLOOD RIGHT ARM  Result Value Ref Range Status   Specimen Description   Final    BLOOD RIGHT ARM Performed at [facility], [address], [place], [place] [address]    Special Requests   Final    BOTTLES DRAWN AEROBIC AND ANAEROBIC Blood Culture results may not be optimal due to an inadequate volume of blood received in culture bottles Performed at [facility], [address] [place], [place] [address]    Culture  Setup Time   Final    GRAM POSITIVE COCCI IN CLUSTERS IN BOTH AEROBIC AND ANAEROBIC BOTTLES CRITICAL VALUE NOTED.  VALUE IS CONSISTENT WITH PREVIOUSLY REPORTED AND CALLED VALUE.    Culture (A)  Final    STAPHYLOCOCCUS AUREUS SUSCEPTIBILITIES PERFORMED ON PREVIOUS CULTURE WITHIN THE LAST 5 DAYS. Performed at [facility], [address], [place], [place] [address]    Report Status 07/14/2022 FINAL  Final  Blood culture (routine x 2)     Status: Abnormal (Preliminary result)   Collection Time: 07/12/22  8:50 AM   Specimen: BLOOD  Result Value Ref Range Status   Specimen Description   Final    BLOOD BLOOD LEFT ARM Performed at [facility], [address], [place], [place] [address]    Special Requests   Final    BOTTLES DRAWN AEROBIC AND ANAEROBIC Blood Culture results may not be optimal due to an inadequate volume of blood received in culture bottles Performed at [facility], [address], [place], [place] [address]    Culture  Setup Time   Final    GRAM POSITIVE COCCI IN CLUSTERS IN BOTH AEROBIC AND ANAEROBIC BOTTLES CRITICAL RESULT CALLED TO, READ BACK BY AND VERIFIED WITH: PHARMD M. Doran Durand 07/13/22 @ 0152 BY AB    Culture (A)  Final    METHICILLIN RESISTANT STAPHYLOCOCCUS  AUREUS [facility] FOR DAPTOMYCIN Performed at [facility], [address] [place], [place] [address]    Report Status PENDING  Incomplete   Organism ID, Bacteria METHICILLIN RESISTANT STAPHYLOCOCCUS AUREUS  Final      Susceptibility   Methicillin resistant staphylococcus aureus - MIC*    CIPROFLOXACIN >=8 RESISTANT Resistant     ERYTHROMYCIN >=8 RESISTANT Resistant     GENTAMICIN <=0.5 SENSITIVE Sensitive     OXACILLIN >=4 RESISTANT Resistant     TETRACYCLINE <=1 SENSITIVE Sensitive     VANCOMYCIN <=0.5 SENSITIVE Sensitive     TRIMETH/SULFA >=320 RESISTANT Resistant     CLINDAMYCIN <=0.25 SENSITIVE Sensitive     RIFAMPIN <=0.5 SENSITIVE Sensitive     Inducible Clindamycin NEGATIVE Sensitive     * METHICILLIN RESISTANT STAPHYLOCOCCUS AUREUS  Blood Culture ID Panel (Reflexed)     Status: Abnormal   Collection Time: 07/12/22  8:50 AM  Result Value Ref Range Status   Enterococcus faecalis NOT DETECTED NOT DETECTED Final   Enterococcus Faecium NOT DETECTED NOT DETECTED Final   Listeria monocytogenes NOT DETECTED NOT DETECTED Final   Staphylococcus species DETECTED (A) NOT DETECTED Final    Comment: CRITICAL RESULT CALLED TO, READ BACK BY AND VERIFIED WITH: PHARMD M. LILLISTON 07/13/22 @ 0152 BY AB    Staphylococcus aureus (BCID) DETECTED (A) NOT DETECTED Final  Comment: Methicillin (oxacillin)-resistant Staphylococcus aureus (MRSA). MRSA is predictably resistant to beta-lactam antibiotics (except ceftaroline). Preferred therapy is vancomycin unless clinically contraindicated. Patient requires contact precautions if  hospitalized. CRITICAL RESULT CALLED TO, READ BACK BY AND VERIFIED WITH: PHARMD M. LILLISTON 07/13/22 @ 0152 BY AB    Staphylococcus epidermidis NOT DETECTED NOT DETECTED Final   Staphylococcus lugdunensis NOT DETECTED NOT DETECTED Final   Streptococcus species NOT DETECTED NOT DETECTED Final   Streptococcus agalactiae NOT DETECTED NOT DETECTED Final    Streptococcus pneumoniae NOT DETECTED NOT DETECTED Final   Streptococcus pyogenes NOT DETECTED NOT DETECTED Final   A.calcoaceticus-baumannii NOT DETECTED NOT DETECTED Final   Bacteroides fragilis NOT DETECTED NOT DETECTED Final   Enterobacterales NOT DETECTED NOT DETECTED Final   Enterobacter cloacae complex NOT DETECTED NOT DETECTED Final   Escherichia coli NOT DETECTED NOT DETECTED Final   Klebsiella aerogenes NOT DETECTED NOT DETECTED Final   Klebsiella oxytoca NOT DETECTED NOT DETECTED Final   Klebsiella pneumoniae NOT DETECTED NOT DETECTED Final   Proteus species NOT DETECTED NOT DETECTED Final   Salmonella species NOT DETECTED NOT DETECTED Final   Serratia marcescens NOT DETECTED NOT DETECTED Final   Haemophilus influenzae NOT DETECTED NOT DETECTED Final   Neisseria meningitidis NOT DETECTED NOT DETECTED Final   Pseudomonas aeruginosa NOT DETECTED NOT DETECTED Final   Stenotrophomonas maltophilia NOT DETECTED NOT DETECTED Final   Candida albicans NOT DETECTED NOT DETECTED Final   Candida auris NOT DETECTED NOT DETECTED Final   Candida glabrata NOT DETECTED NOT DETECTED Final   Candida krusei NOT DETECTED NOT DETECTED Final   Candida parapsilosis NOT DETECTED NOT DETECTED Final   Candida tropicalis NOT DETECTED NOT DETECTED Final   Cryptococcus neoformans/gattii NOT DETECTED NOT DETECTED Final   Meth resistant mecA/C and MREJ DETECTED (A) NOT DETECTED Final    Comment: CRITICAL RESULT CALLED TO, READ BACK BY AND VERIFIED WITH: PHARMD M. LILLISTON 07/13/22 @ 0152 BY AB Performed at [facility], [address] [place], [place] [address]   SARS Coronavirus 2 by RT PCR (hospital order, performed in [facility] hospital lab) *cepheid single result test* Anterior Nasal Swab     Status: None   Collection Time: 07/12/22  9:24 AM   Specimen: Anterior Nasal Swab  Result Value Ref Range Status   SARS Coronavirus 2 by RT PCR NEGATIVE NEGATIVE Final    Comment: (NOTE) SARS-CoV-2  target nucleic acids are NOT DETECTED.  The SARS-CoV-2 RNA is generally detectable in upper and lower respiratory specimens during the acute phase of infection. The lowest concentration of SARS-CoV-2 viral copies this assay can detect is 250 copies / mL. A negative result does not preclude SARS-CoV-2 infection and should not be used as the sole basis for treatment or other patient management decisions.  A negative result may occur with improper specimen collection / handling, submission of specimen other than nasopharyngeal swab, presence of viral mutation(s) within the areas targeted by this assay, and inadequate number of viral copies (<250 copies / mL). A negative result must be combined with clinical observations, patient history, and epidemiological information.  Fact Sheet for Patients:   RoadLapTop.co.za  Fact Sheet for Healthcare Providers: http://kim-miller.com/  This test is not yet approved or  cleared by the [place] FDA and has been authorized for detection and/or diagnosis of SARS-CoV-2 by FDA under an Emergency Use Authorization (EUA).  This EUA will remain in effect (meaning this test can be used) for the duration of the COVID-19  declaration under Section 564(b)(1) of the Act, 21 U.S.C. section 360bbb-3(b)(1), unless the authorization is terminated or revoked sooner.  Performed at [facility], [address], [place], [place] [address]   Respiratory (~20 pathogens) panel by PCR     Status: None   Collection Time: 07/12/22  2:03 PM   Specimen: Nasopharyngeal Swab; Respiratory  Result Value Ref Range Status   Adenovirus NOT DETECTED NOT DETECTED Final   Coronavirus 229E NOT DETECTED NOT DETECTED Final    Comment: (NOTE) The Coronavirus on the Respiratory Panel, DOES NOT test for the novel  Coronavirus (2019 nCoV)    Coronavirus HKU1 NOT DETECTED NOT DETECTED Final   Coronavirus NL63 NOT  DETECTED NOT DETECTED Final   Coronavirus OC43 NOT DETECTED NOT DETECTED Final   Metapneumovirus NOT DETECTED NOT DETECTED Final   Rhinovirus / Enterovirus NOT DETECTED NOT DETECTED Final   Influenza A NOT DETECTED NOT DETECTED Final   Influenza B NOT DETECTED NOT DETECTED Final   Parainfluenza Virus 1 NOT DETECTED NOT DETECTED Final   Parainfluenza Virus 2 NOT DETECTED NOT DETECTED Final   Parainfluenza Virus 3 NOT DETECTED NOT DETECTED Final   Parainfluenza Virus 4 NOT DETECTED NOT DETECTED Final   Respiratory Syncytial Virus NOT DETECTED NOT DETECTED Final   Bordetella pertussis NOT DETECTED NOT DETECTED Final   Bordetella Parapertussis NOT DETECTED NOT DETECTED Final   Chlamydophila pneumoniae NOT DETECTED NOT DETECTED Final   Mycoplasma pneumoniae NOT DETECTED NOT DETECTED Final    Comment: Performed at [facility], [address] [place], [place] [address]  Minimum Inhibitory Conc. (1 Drug)     Status: None   Collection Time: 07/13/22  2:47 PM  Result Value Ref Range Status   Min Inhibitory Conc (1 Drug) Preliminary report  Final    Comment: (NOTE) Performed At: [facility] [address] [place], [place] [id number] Jolene Schimke MD XB:[id number]    Source CRE SAUR BLOOD  Final    Comment: Performed at [facility], [address], [place], [place] [address]  Culture, blood (Routine X 2) w Reflex to ID Panel     Status: None   Collection Time: 07/14/22  5:25 AM   Specimen: BLOOD  Result Value Ref Range Status   Specimen Description   Final    BLOOD LEFT ANTECUBITAL Performed at [facility], [address], [place], [place] [address]    Special Requests   Final    BOTTLES DRAWN AEROBIC AND ANAEROBIC Blood Culture adequate volume Performed at [facility], [address], [place], [place] [address]    Culture   Final    NO GROWTH 5 DAYS Performed at [facility], [address],  [place], [place] [address]    Report Status 07/19/2022 FINAL  Final  Culture, blood (Routine X 2) w Reflex to ID Panel     Status: None   Collection Time: 07/14/22  5:25 AM   Specimen: BLOOD  Result Value Ref Range Status   Specimen Description   Final    BLOOD BLOOD LEFT HAND Performed at [facility], [address], [place], [place] [address]    Special Requests   Final    BOTTLES DRAWN AEROBIC AND ANAEROBIC Blood Culture adequate volume Performed at [facility], [address] [place], [place] [address]    Culture   Final    NO GROWTH 5 DAYS Performed at [facility], [address] [place], [place] [address]    Report Status 07/19/2022 FINAL  Final     Radiology Studies: No results found.   Pamella Pert, MD, PhD [facility]  Between 7 am - 7 pm I am available, please contact me via Amion (for emergencies) or Securechat (non urgent messages)  Between 7 pm - 7 am I am not available, please contact night coverage MD/APP via Amion

## 2022-07-19 NOTE — Progress Notes (Signed)
Mobility Specialist - Progress Note   07/19/22 1443  Mobility  Activity Transferred from bed to chair  Level of Assistance Independent after set-up  Distance Ambulated (ft) 5 ft  Activity Response Tolerated well  Mobility Referral Yes  $Mobility charge 1 Mobility   Pt received in bed declining ambulation in halls due to pain, but willing to transfer to recliner. Nurse made aware of pt being in pain. No other complaints during session. Pt to recliner after session with all needs met & call bell in reach. Nurse in room.    [facility]

## 2022-07-20 ENCOUNTER — Inpatient Hospital Stay (HOSPITAL_COMMUNITY): Payer: Medicaid Other

## 2022-07-20 DIAGNOSIS — I5020 Unspecified systolic (congestive) heart failure: Secondary | ICD-10-CM

## 2022-07-20 DIAGNOSIS — A419 Sepsis, unspecified organism: Secondary | ICD-10-CM

## 2022-07-20 DIAGNOSIS — Z794 Long term (current) use of insulin: Secondary | ICD-10-CM

## 2022-07-20 DIAGNOSIS — I10 Essential (primary) hypertension: Secondary | ICD-10-CM

## 2022-07-20 DIAGNOSIS — E119 Type 2 diabetes mellitus without complications: Secondary | ICD-10-CM

## 2022-07-20 DIAGNOSIS — E871 Hypo-osmolality and hyponatremia: Secondary | ICD-10-CM

## 2022-07-20 DIAGNOSIS — E7800 Pure hypercholesterolemia, unspecified: Secondary | ICD-10-CM

## 2022-07-20 DIAGNOSIS — G4730 Sleep apnea, unspecified: Secondary | ICD-10-CM

## 2022-07-20 DIAGNOSIS — I4891 Unspecified atrial fibrillation: Secondary | ICD-10-CM

## 2022-07-20 DIAGNOSIS — J189 Pneumonia, unspecified organism: Secondary | ICD-10-CM

## 2022-07-20 LAB — URINALYSIS, COMPLETE (UACMP) WITH MICROSCOPIC
Bacteria, UA: NONE SEEN
Bilirubin Urine: NEGATIVE
Glucose, UA: NEGATIVE mg/dL
Hgb urine dipstick: NEGATIVE
Ketones, ur: NEGATIVE mg/dL
Leukocytes,Ua: NEGATIVE
Nitrite: NEGATIVE
Protein, ur: NEGATIVE mg/dL
Specific Gravity, Urine: 1.009 (ref 1.005–1.030)
pH: 5 (ref 5.0–8.0)

## 2022-07-20 LAB — HEPATIC FUNCTION PANEL
ALT: 29 U/L (ref 0–44)
AST: 22 U/L (ref 15–41)
Albumin: 2.3 g/dL — ABNORMAL LOW (ref 3.5–5.0)
Alkaline Phosphatase: 114 U/L (ref 38–126)
Bilirubin, Direct: 0.3 mg/dL — ABNORMAL HIGH (ref 0.0–0.2)
Indirect Bilirubin: 1.6 mg/dL — ABNORMAL HIGH (ref 0.3–0.9)
Total Bilirubin: 1.9 mg/dL — ABNORMAL HIGH (ref 0.3–1.2)
Total Protein: 6.7 g/dL (ref 6.5–8.1)

## 2022-07-20 LAB — CBC WITH DIFFERENTIAL/PLATELET
Abs Immature Granulocytes: 0.18 10*3/uL — ABNORMAL HIGH (ref 0.00–0.07)
Basophils Absolute: 0.1 10*3/uL (ref 0.0–0.1)
Basophils Relative: 1 %
Eosinophils Absolute: 0.1 10*3/uL (ref 0.0–0.5)
Eosinophils Relative: 1 %
HCT: 33.1 % — ABNORMAL LOW (ref 39.0–52.0)
Hemoglobin: 11.4 g/dL — ABNORMAL LOW (ref 13.0–17.0)
Immature Granulocytes: 1 %
Lymphocytes Relative: 8 %
Lymphs Abs: 1.1 10*3/uL (ref 0.7–4.0)
MCH: 31.3 pg (ref 26.0–34.0)
MCHC: 34.4 g/dL (ref 30.0–36.0)
MCV: 90.9 fL (ref 80.0–100.0)
Monocytes Absolute: 1.8 10*3/uL — ABNORMAL HIGH (ref 0.1–1.0)
Monocytes Relative: 13 %
Neutro Abs: 10.8 10*3/uL — ABNORMAL HIGH (ref 1.7–7.7)
Neutrophils Relative %: 76 %
Platelets: 364 10*3/uL (ref 150–400)
RBC: 3.64 MIL/uL — ABNORMAL LOW (ref 4.22–5.81)
RDW: 14 % (ref 11.5–15.5)
WBC: 14.1 10*3/uL — ABNORMAL HIGH (ref 4.0–10.5)
nRBC: 0 % (ref 0.0–0.2)

## 2022-07-20 LAB — BASIC METABOLIC PANEL
Anion gap: 9 (ref 5–15)
BUN: 25 mg/dL — ABNORMAL HIGH (ref 6–20)
CO2: 21 mmol/L — ABNORMAL LOW (ref 22–32)
Calcium: 8.7 mg/dL — ABNORMAL LOW (ref 8.9–10.3)
Chloride: 93 mmol/L — ABNORMAL LOW (ref 98–111)
Creatinine, Ser: 1.42 mg/dL — ABNORMAL HIGH (ref 0.61–1.24)
GFR, Estimated: 57 mL/min — ABNORMAL LOW (ref >60)
Glucose, Bld: 127 mg/dL — ABNORMAL HIGH (ref 70–99)
Potassium: 4.7 mmol/L (ref 3.5–5.1)
Sodium: 123 mmol/L — ABNORMAL LOW (ref 135–145)

## 2022-07-20 LAB — GLUCOSE, CAPILLARY
Glucose-Capillary: 133 mg/dL — ABNORMAL HIGH (ref 70–99)
Glucose-Capillary: 141 mg/dL — ABNORMAL HIGH (ref 70–99)
Glucose-Capillary: 111 mg/dL — ABNORMAL HIGH (ref 70–99)
Glucose-Capillary: 187 mg/dL — ABNORMAL HIGH (ref 70–99)

## 2022-07-20 LAB — MAGNESIUM: Magnesium: 1.9 mg/dL (ref 1.7–2.4)

## 2022-07-20 LAB — CREATININE, URINE, RANDOM: Creatinine, Urine: 108 mg/dL

## 2022-07-20 LAB — SODIUM, URINE, RANDOM: Sodium, Ur: <10 mmol/L

## 2022-07-20 LAB — PHOSPHORUS: Phosphorus: 3.5 mg/dL (ref 2.5–4.6)

## 2022-07-20 MED ORDER — FUROSEMIDE 10 MG/ML IJ SOLN
20.0000 mg | Freq: Once | INTRAMUSCULAR | Status: AC
  Administered 2022-07-20: 20 mg via INTRAVENOUS
  Filled 2022-07-20: qty 2

## 2022-07-20 MED ORDER — IPRATROPIUM-ALBUTEROL 0.5-2.5 (3) MG/3ML IN SOLN: 3.0000 mL | Freq: Four times a day (QID) | RESPIRATORY_TRACT | Status: DC | PRN

## 2022-07-20 NOTE — Progress Notes (Signed)
PROGRESS NOTE    David [name]  WUJ:[id number] DOB: 12/19/1960 DOA: 07/12/2022 PCP: [name], MD   Brief Narrative:  The patient is a 62 y.o. 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 His appetite has been decreased. He had a fever at home of 105 F. He was having significant chills and rigors.  His rigors were so intense that produced the above-mentioned back pain.  He was found to have recurrent MRSA bacteremia but given that no vegetations on his TEE ID recommended 2 weeks of total antibiotics but given his financial issues they have given him a dose of oritavancin now.  Sodium remains on the low side   Assessment and Plan:  Sepsis due to MRSA bacteremia -Blood cultures obtained on admission showing MRSA.   -He has a history of MRSA bacteremia in 2023 felt to be due to diabetic toe infections.   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 [name].  -Due to severe back pain he underwent imaging, MRI of the T and L-spine with nonspecific findings, no osteomyelitis or discitis.   -2D echo and TEE without vegetations.   -WBC Trend: Recent Labs  Lab 07/12/22 0850 07/13/22 0419 07/14/22 0525 07/15/22 0755 07/17/22 0300 07/18/22 0221 07/20/22 1010  WBC 17.0* 19.1* 22.6* 18.0* 15.9* 14.7* 14.1*  -ID recommends total of 2 weeks of antibiotics.   -PICC line placed, and there were plans in place for him to get home health, however due to financial reasons this could not be arranged and patient received a dose of oritavancin on 4/15.   -PICC can be removed  upon discharge   Right foot ulcer  -At the site of prior ray amputation.  Patient tells me that it is improving.   -Plain imaging without osteomyelitis, wound looks good without any evidence of deep infection -Already received Abx as above -Obtain WOC consult   Severe back pain  -Lumbar MRI with diffuse edema involving the lower posterior paraspinous musculature, nonspecific, acute myositis possible infectious versus inflammatory.   -There is no loculated or drainable fluid collections.  Negative for discitis/osteomyelitis.   -Continue oxycodone, Flexeril made him loopy and he was switched to Robaxin -C/w bowel regimen    Hyponatremia  --stable for most part but with sudden worsening of 121 on 4/15, after a bolus of fluids for hypotension (possibly induced by pain medications).   -He also tells me he drinks large amounts of water several pitchers per day.   -He was placed on water restriction, sodium slightly better, continue to monitor.   -Na+ Trend:  Recent Labs  Lab 07/14/22 0525 07/15/22 0755 07/17/22 0300 07/18/22 0221 07/18/22 2002 07/19/22 0423 07/20/22 0445  NA 125* 125* 126* 121* 121* 122* 123*  -His diuretics had been discontinued but he appeared volume overloaded so will give him IV Lasix 20 mg x1.  -Baseline is around 128-130.  If he gets in the upper 120s could potentially go home  -Manage discussed with nephrology in the morning if not improving    Essential Hypertension  -Continue his Sherryll Burger was held due to Hypotension, Coreg dose decreased. -Continue to Monitor BP per Protocol  CKD 3A Metabolic Acidosis -Baseline creatinine 1.2-1.5 -BUN/Cr Trend: Recent Labs  Lab 07/13/22 0419 07/14/22 0525 07/15/22 0755 07/17/22 0300 07/18/22 0221 07/19/22 0423 07/20/22 0445  BUN 35* 38* 30* 23* 21* 22* 25*  CREATININE 1.32* 1.24 1.23 1.07 1.17 1.13 1.42*  -CO2 was 21, anion gap was 9, chloride level was 93 -Avoid Nephrotoxic Medications, Contrast Dyes,  Hypotension and Dehydration to Ensure Adequate Renal Perfusion and will need to Renally Adjust Meds -Continue to Monitor and Trend Renal Function carefully and repeat CMP in the AM   Hypomagnesemia -Patient's Mag Level Trend: Recent Labs  Lab 07/14/22 0525 07/17/22 0300 07/20/22 0445  MG 2.1 2.0 1.9  -Replete with IV Mag Sulfate 2 grams -Continue to Monitor and Replete as Necessary -Repeat Mag in the AM   Hyperbilirubinemia -Bilirubin Trend: Recent Labs  Lab 07/09/22 1756 07/12/22 0850 07/13/22 0419 07/14/22 0525 07/17/22 0300 07/20/22 1010  BILITOT 1.6* 1.4* 1.0 0.9 1.2 1.9*  -Continue to Monitor and Trend and Repeat CMP in the AM  Atrial Fibrillation -Rate controlled, continue Eliquis, Coreg.   -Continue to Monitor on Telemetry  -Followed by cardiology as an outpatient   Chronic Combined CHF  -His EF has improved and now normalized.  Diuretics were discontinued due to his hyponatremia, continue fluid restriction, Entresto also held due to hypotension.   -He appeared volume overloaded today so we will give him a dose of IV Lasix 20 mg x 1 -Strict I's and O's and Daily Weights No intake or output data in the 24 hours ending 07/20/22 1907  -He probably needs to stay off of his heart meds on discharge at least for short amount of time and have close outpatient follow-up  Normocytic Anemia -Hgb/Hct Trend Recent Labs  Lab 07/12/22 0850 07/13/22 0419 07/14/22 0525 07/15/22 0755 07/17/22 0300 07/18/22 0221 07/20/22 1010  HGB 15.2 12.3* 12.0* 13.5 12.4* 12.2* 11.4*  HCT 44.9 36.7* 36.8* 40.6 36.9* 36.0* 33.1*  MCV 92.4 94.3 95.6 94.4 92.9 92.3 90.9  -Check Anemia Panel in the AM    Hyperlipidemia  -Continue Atorvastatin 80 mg po Daily   IDDM, poorly controlled, with hyperglycemia -Continue Insulin Glargine 20 units sq Daily -C/w Resistant Novolog SSI AC -Continue to Monitor CBGs carefully; CBG Trend: Recent Labs  Lab 07/19/22 0752 07/19/22 1153 07/19/22 1645  07/19/22 2303 07/20/22 0743 07/20/22 1148 07/20/22 1615  GLUCAP 145* 123* 145* 117* 133* 141* 111*   Hypoalbuminemia -Patient's Albumin Trend: Recent Labs  Lab 07/09/22 1756 07/12/22 0850 07/13/22 0419 07/14/22 0525 07/17/22 0300 07/20/22 1010  ALBUMIN 3.1* 3.0* 2.3* 2.5* 2.4* 2.3*  -Continue to Monitor and Trend and repeat CMP in the AM  DVT prophylaxis:  apixaban (ELIQUIS) tablet 5 mg    Code Status: Full Code Family Communication: No family currently at bedside  Disposition Plan:  Level of care: Med-Surg Status is: Inpatient Remains inpatient appropriate because: Needs further clinical improvement and improvement in his sodium prior to safe discharge disposition   Consultants:  Infectious Diseases   Procedures:  As delineated as above  Antimicrobials:  Anti-infectives (From admission, onward)    Start     Dose/Rate Route Frequency Ordered Stop   07/18/22 1500  Oritavancin Diphosphate (ORBACTIV) 1,200 mg in dextrose 5 % IVPB        1,200 mg 333.3 mL/hr over 180 Minutes Intravenous Once 07/18/22 1351 07/18/22 1822   07/16/22 0000  daptomycin (CUBICIN) IVPB  Status:  Discontinued        650 mg Intravenous Every  24 hours 07/16/22 1116 07/19/22    07/15/22 2000  DAPTOmycin (CUBICIN) 650 mg in sodium chloride 0.9 % IVPB  Status:  Discontinued        8 mg/kg  81 kg 126 mL/hr over 30 Minutes Intravenous Daily 07/15/22 1411 07/15/22 1413   07/15/22 2000  DAPTOmycin (CUBICIN) 650 mg in sodium chloride 0.9 % IVPB  Status:  Discontinued        8 mg/kg  81 kg 126 mL/hr over 30 Minutes Intravenous Daily 07/15/22 1413 07/18/22 1351   07/14/22 0200  vancomycin (VANCOREADY) IVPB 1750 mg/350 mL  Status:  Discontinued        1,750 mg 175 mL/hr over 120 Minutes Intravenous Every 24 hours 07/13/22 0718 07/15/22 1411   07/13/22 1000  cefTRIAXone (ROCEPHIN) 1 g in sodium chloride 0.9 % 100 mL IVPB  Status:  Discontinued        1 g 200 mL/hr over 30 Minutes Intravenous Every 24  hours 07/12/22 1405 07/13/22 0239   07/13/22 1000  azithromycin (ZITHROMAX) 500 mg in sodium chloride 0.9 % 250 mL IVPB  Status:  Discontinued        500 mg 250 mL/hr over 60 Minutes Intravenous Every 24 hours 07/12/22 1405 07/13/22 0239   07/13/22 0330  vancomycin (VANCOREADY) IVPB 1750 mg/350 mL        1,750 mg 175 mL/hr over 120 Minutes Intravenous  Once 07/13/22 0240 07/13/22 0517   07/12/22 0845  cefTRIAXone (ROCEPHIN) 1 g in sodium chloride 0.9 % 100 mL IVPB        1 g 200 mL/hr over 30 Minutes Intravenous  Once 07/12/22 0844 07/12/22 1218   07/12/22 0845  azithromycin (ZITHROMAX) 500 mg in sodium chloride 0.9 % 250 mL IVPB        500 mg 250 mL/hr over 60 Minutes Intravenous  Once 07/12/22 0844 07/12/22 1218       Subjective: Seen and examined at bedside and nurse states that he is hallucinating a little bit earlier.  Complains of some side chest pain.  Denies any lightheadedness or dizziness.  No other concerns or complaints this time.  Objective: Vitals:   07/20/22 1153 07/20/22 1248 07/20/22 1343 07/20/22 1521  BP: (!) 90/59 96/70 91/65  113/69  Pulse: 81  93 79  Resp: (!) 22   18  Temp: (!) 97.2 F (36.2 C) 98.9 F (37.2 C)    TempSrc: Oral Oral    SpO2: 93%  94%   Weight:      Height:       No intake or output data in the 24 hours ending 07/20/22 1855 Filed Weights   07/12/22 0824 07/12/22 1219  Weight: 81 kg 81 kg   Examination: Physical Exam:  Constitutional: Chronically ill-appearing Caucasian male in no acute distress Respiratory: Diminished to auscultation bilaterally, no wheezing, rales, rhonchi or crackles. Normal respiratory effort and patient is not tachypenic. No accessory muscle use.  Unlabored breathing Cardiovascular: RRR, no murmurs / rubs / gallops. S1 and S2 auscultated. No extremity edema.  Abdomen: Soft, non-tender, non-distended.  Distended secondary to body habitus. Bowel sounds positive.  GU: Deferred. Musculoskeletal: No clubbing /  cyanosis of digits/nails. No joint deformity upper and lower extremities.  Skin: No rashes, lesions, ulcers on limited skin evaluation has multiple tattoos. No induration; Warm and dry.  Neurologic: CN 2-12 grossly intact with no focal deficits. Romberg sign and cerebellar reflexes not assessed.  Psychiatric: Normal judgment and insight. Alert and oriented x 3. Normal mood  and appropriate affect.   Data Reviewed: I have personally reviewed following labs and imaging studies  CBC: Recent Labs  Lab 07/14/22 0525 07/15/22 0755 07/17/22 0300 07/18/22 0221 07/20/22 1010  WBC 22.6* 18.0* 15.9* 14.7* 14.1*  NEUTROABS  --   --   --   --  10.8*  HGB 12.0* 13.5 12.4* 12.2* 11.4*  HCT 36.8* 40.6 36.9* 36.0* 33.1*  MCV 95.6 94.4 92.9 92.3 90.9  PLT 270 302 321 353 364   Basic Metabolic Panel: Recent Labs  Lab 07/14/22 0525 07/15/22 0755 07/17/22 0300 07/18/22 0221 07/18/22 2002 07/19/22 0423 07/20/22 0445 07/20/22 1010  NA 125* 125* 126* 121* 121* 122* 123*  --   K 4.7 4.9 4.3 4.1  --  4.4 4.7  --   CL 94* 92* 96* 93*  --  94* 93*  --   CO2 20* 24 23 21*  --  22 21*  --   GLUCOSE 335* 160* 241* 172*  --  203* 127*  --   BUN 38* 30* 23* 21*  --  22* 25*  --   CREATININE 1.24 1.23 1.07 1.17  --  1.13 1.42*  --   CALCIUM 8.5* 9.0 8.8* 8.7*  --  8.3* 8.7*  --   MG 2.1  --  2.0  --   --   --  1.9  --   PHOS  --   --   --   --   --   --   --  3.5   GFR: Estimated Creatinine Clearance: 60.7 mL/min (A) (by C-G formula based on SCr of 1.42 mg/dL (H)). Liver Function Tests: Recent Labs  Lab 07/14/22 0525 07/17/22 0300 07/20/22 1010  AST 27 20 22   ALT 36 28 29  ALKPHOS 114 115 114  BILITOT 0.9 1.2 1.9*  PROT 6.1* 6.7 6.7  ALBUMIN 2.5* 2.4* 2.3*   No results for input(s): "LIPASE", "AMYLASE" in the last 168 hours. No results for input(s): "AMMONIA" in the last 168 hours. Coagulation Profile: No results for input(s): "INR", "PROTIME" in the last 168 hours. Cardiac  Enzymes: Recent Labs  Lab 07/16/22 0552  CKTOTAL 20*   BNP (last 3 results) No results for input(s): "PROBNP" in the last 8760 hours. HbA1C: No results for input(s): "HGBA1C" in the last 72 hours. CBG: Recent Labs  Lab 07/19/22 1645 07/19/22 2303 07/20/22 0743 07/20/22 1148 07/20/22 1615  GLUCAP 145* 117* 133* 141* 111*   Lipid Profile: No results for input(s): "CHOL", "HDL", "LDLCALC", "TRIG", "CHOLHDL", "LDLDIRECT" in the last 72 hours. Thyroid Function Tests: No results for input(s): "TSH", "T4TOTAL", "FREET4", "T3FREE", "THYROIDAB" in the last 72 hours. Anemia Panel: No results for input(s): "VITAMINB12", "FOLATE", "FERRITIN", "TIBC", "IRON", "RETICCTPCT" in the last 72 hours. Sepsis Labs: No results for input(s): "PROCALCITON", "LATICACIDVEN" in the last 168 hours.  Recent Results (from the past 240 hour(s))  Blood culture (routine x 2)     Status: Abnormal   Collection Time: 07/12/22  8:48 AM   Specimen: BLOOD RIGHT ARM  Result Value Ref Range Status   Specimen Description   Final    BLOOD RIGHT ARM Performed at [facility], [address] [place], [place] [address]    Special Requests   Final    BOTTLES DRAWN AEROBIC AND ANAEROBIC Blood Culture results may not be optimal due to an inadequate volume of blood received in culture bottles Performed at [facility], [address] [place], [place] [address]  Culture  Setup Time   Final    GRAM POSITIVE COCCI IN CLUSTERS IN BOTH AEROBIC AND ANAEROBIC BOTTLES CRITICAL VALUE NOTED.  VALUE IS CONSISTENT WITH PREVIOUSLY REPORTED AND CALLED VALUE.    Culture (A)  Final    STAPHYLOCOCCUS AUREUS SUSCEPTIBILITIES PERFORMED ON PREVIOUS CULTURE WITHIN THE LAST 5 DAYS. Performed at [facility], [address] [place], [place] [address]    Report Status 07/14/2022 FINAL  Final  Blood culture (routine x 2)     Status: Abnormal   Collection Time: 07/12/22  8:50 AM   Specimen: BLOOD   Result Value Ref Range Status   Specimen Description   Final    BLOOD BLOOD LEFT ARM Performed at [facility], [address] [place], [place] [address]    Special Requests   Final    BOTTLES DRAWN AEROBIC AND ANAEROBIC Blood Culture results may not be optimal due to an inadequate volume of blood received in culture bottles Performed at [facility], [address], [place], [place] [address]    Culture  Setup Time   Final    GRAM POSITIVE COCCI IN CLUSTERS IN BOTH AEROBIC AND ANAEROBIC BOTTLES CRITICAL RESULT CALLED TO, READ BACK BY AND VERIFIED WITH: PHARMD M. Doran Durand 07/13/22 @ 0152 BY AB    Culture (A)  Final    METHICILLIN RESISTANT STAPHYLOCOCCUS AUREUS SEE SEPARATE REPORT FOR DAPTOMYCIN Performed at [facility], [address], [place], [place] [address]    Report Status 07/19/2022 FINAL  Final   Organism ID, Bacteria METHICILLIN RESISTANT STAPHYLOCOCCUS AUREUS  Final      Susceptibility   Methicillin resistant staphylococcus aureus - MIC*    CIPROFLOXACIN >=8 RESISTANT Resistant     ERYTHROMYCIN >=8 RESISTANT Resistant     GENTAMICIN <=0.5 SENSITIVE Sensitive     OXACILLIN >=4 RESISTANT Resistant     TETRACYCLINE <=1 SENSITIVE Sensitive     VANCOMYCIN <=0.5 SENSITIVE Sensitive     TRIMETH/SULFA >=320 RESISTANT Resistant     CLINDAMYCIN <=0.25 SENSITIVE Sensitive     RIFAMPIN <=0.5 SENSITIVE Sensitive     Inducible Clindamycin NEGATIVE Sensitive     * METHICILLIN RESISTANT STAPHYLOCOCCUS AUREUS  Blood Culture ID Panel (Reflexed)     Status: Abnormal   Collection Time: 07/12/22  8:50 AM  Result Value Ref Range Status   Enterococcus faecalis NOT DETECTED NOT DETECTED Final   Enterococcus Faecium NOT DETECTED NOT DETECTED Final   Listeria monocytogenes NOT DETECTED NOT DETECTED Final   Staphylococcus species DETECTED (A) NOT DETECTED Final    Comment: CRITICAL RESULT CALLED TO, READ BACK BY AND VERIFIED WITH: PHARMD M.  LILLISTON 07/13/22 @ 0152 BY AB    Staphylococcus aureus (BCID) DETECTED (A) NOT DETECTED Final    Comment: Methicillin (oxacillin)-resistant Staphylococcus aureus (MRSA). MRSA is predictably resistant to beta-lactam antibiotics (except ceftaroline). Preferred therapy is vancomycin unless clinically contraindicated. Patient requires contact precautions if  hospitalized. CRITICAL RESULT CALLED TO, READ BACK BY AND VERIFIED WITH: PHARMD M. LILLISTON 07/13/22 @ 0152 BY AB    Staphylococcus epidermidis NOT DETECTED NOT DETECTED Final   Staphylococcus lugdunensis NOT DETECTED NOT DETECTED Final   Streptococcus species NOT DETECTED NOT DETECTED Final   Streptococcus agalactiae NOT DETECTED NOT DETECTED Final   Streptococcus pneumoniae NOT DETECTED NOT DETECTED Final   Streptococcus pyogenes NOT DETECTED NOT DETECTED Final   A.calcoaceticus-baumannii NOT DETECTED NOT DETECTED Final   Bacteroides fragilis NOT DETECTED NOT DETECTED Final   Enterobacterales  NOT DETECTED NOT DETECTED Final   Enterobacter cloacae complex NOT DETECTED NOT DETECTED Final   Escherichia coli NOT DETECTED NOT DETECTED Final   Klebsiella aerogenes NOT DETECTED NOT DETECTED Final   Klebsiella oxytoca NOT DETECTED NOT DETECTED Final   Klebsiella pneumoniae NOT DETECTED NOT DETECTED Final   Proteus species NOT DETECTED NOT DETECTED Final   Salmonella species NOT DETECTED NOT DETECTED Final   Serratia marcescens NOT DETECTED NOT DETECTED Final   Haemophilus influenzae NOT DETECTED NOT DETECTED Final   Neisseria meningitidis NOT DETECTED NOT DETECTED Final   Pseudomonas aeruginosa NOT DETECTED NOT DETECTED Final   Stenotrophomonas maltophilia NOT DETECTED NOT DETECTED Final   Candida albicans NOT DETECTED NOT DETECTED Final   Candida auris NOT DETECTED NOT DETECTED Final   Candida glabrata NOT DETECTED NOT DETECTED Final   Candida krusei NOT DETECTED NOT DETECTED Final   Candida parapsilosis NOT DETECTED NOT DETECTED Final    Candida tropicalis NOT DETECTED NOT DETECTED Final   Cryptococcus neoformans/gattii NOT DETECTED NOT DETECTED Final   Meth resistant mecA/C and MREJ DETECTED (A) NOT DETECTED Final    Comment: CRITICAL RESULT CALLED TO, READ BACK BY AND VERIFIED WITH: PHARMD M. LILLISTON 07/13/22 @ 0152 BY AB Performed at [facility], [address] [place], [place] [address]   SARS Coronavirus 2 by RT PCR (hospital order, performed in [facility] hospital lab) *cepheid single result test* Anterior Nasal Swab     Status: None   Collection Time: 07/12/22  9:24 AM   Specimen: Anterior Nasal Swab  Result Value Ref Range Status   SARS Coronavirus 2 by RT PCR NEGATIVE NEGATIVE Final    Comment: (NOTE) SARS-CoV-2 target nucleic acids are NOT DETECTED.  The SARS-CoV-2 RNA is generally detectable in upper and lower respiratory specimens during the acute phase of infection. The lowest concentration of SARS-CoV-2 viral copies this assay can detect is 250 copies / mL. A negative result does not preclude SARS-CoV-2 infection and should not be used as the sole basis for treatment or other patient management decisions.  A negative result may occur with improper specimen collection / handling, submission of specimen other than nasopharyngeal swab, presence of viral mutation(s) within the areas targeted by this assay, and inadequate number of viral copies (<250 copies / mL). A negative result must be combined with clinical observations, patient history, and epidemiological information.  Fact Sheet for Patients:   RoadLapTop.co.za  Fact Sheet for Healthcare Providers: http://kim-miller.com/  This test is not yet approved or  cleared by the [place] FDA and has been authorized for detection and/or diagnosis of SARS-CoV-2 by FDA under an Emergency Use Authorization (EUA).  This EUA will remain in effect (meaning this test can be used) for the duration of  the COVID-19 declaration under Section 564(b)(1) of the Act, 21 U.S.C. section 360bbb-3(b)(1), unless the authorization is terminated or revoked sooner.  Performed at [facility], [address], [place], [place] [address]   Respiratory (~20 pathogens) panel by PCR     Status: None   Collection Time: 07/12/22  2:03 PM   Specimen: Nasopharyngeal Swab; Respiratory  Result Value Ref Range Status   Adenovirus NOT DETECTED NOT DETECTED Final   Coronavirus 229E NOT DETECTED NOT DETECTED Final    Comment: (NOTE) The Coronavirus on the Respiratory Panel, DOES NOT test for the novel  Coronavirus (2019 nCoV)    Coronavirus HKU1 NOT DETECTED NOT DETECTED Final   Coronavirus NL63 NOT DETECTED NOT  DETECTED Final   Coronavirus OC43 NOT DETECTED NOT DETECTED Final   Metapneumovirus NOT DETECTED NOT DETECTED Final   Rhinovirus / Enterovirus NOT DETECTED NOT DETECTED Final   Influenza A NOT DETECTED NOT DETECTED Final   Influenza B NOT DETECTED NOT DETECTED Final   Parainfluenza Virus 1 NOT DETECTED NOT DETECTED Final   Parainfluenza Virus 2 NOT DETECTED NOT DETECTED Final   Parainfluenza Virus 3 NOT DETECTED NOT DETECTED Final   Parainfluenza Virus 4 NOT DETECTED NOT DETECTED Final   Respiratory Syncytial Virus NOT DETECTED NOT DETECTED Final   Bordetella pertussis NOT DETECTED NOT DETECTED Final   Bordetella Parapertussis NOT DETECTED NOT DETECTED Final   Chlamydophila pneumoniae NOT DETECTED NOT DETECTED Final   Mycoplasma pneumoniae NOT DETECTED NOT DETECTED Final    Comment: Performed at [facility], [address], [place], [place] [address]  Minimum Inhibitory Conc. (1 Drug)     Status: None   Collection Time: 07/13/22  2:47 PM  Result Value Ref Range Status   Min Inhibitory Conc (1 Drug) Final report  Corrected    Comment: (NOTE) Performed At: [facility] [address] [place], [place] [id number] Jolene Schimke MD [id number] CORRECTED ON 04/16  AT 1236: PREVIOUSLY REPORTED AS Preliminary report    Source CRE SAUR BLOOD  Final    Comment: Performed at [facility], [address], [place], [place] [address]  Culture, blood (Routine X 2) w Reflex to ID Panel     Status: None   Collection Time: 07/14/22  5:25 AM   Specimen: BLOOD  Result Value Ref Range Status   Specimen Description   Final    BLOOD LEFT ANTECUBITAL Performed at [facility], [address], [place], [place] [address]    Special Requests   Final    BOTTLES DRAWN AEROBIC AND ANAEROBIC Blood Culture adequate volume Performed at [facility], [address] [place], [place] [address]    Culture   Final    NO GROWTH 5 DAYS Performed at [facility], [address], [place], [place] [address]    Report Status 07/19/2022 FINAL  Final  Culture, blood (Routine X 2) w Reflex to ID Panel     Status: None   Collection Time: 07/14/22  5:25 AM   Specimen: BLOOD  Result Value Ref Range Status   Specimen Description   Final    BLOOD BLOOD LEFT HAND Performed at [facility], [address] [place], [place] [address]    Special Requests   Final    BOTTLES DRAWN AEROBIC AND ANAEROBIC Blood Culture adequate volume Performed at [facility], [address], [place], [place] [address]    Culture   Final    NO GROWTH 5 DAYS Performed at [facility], [address], [place], [place] [address]    Report Status 07/19/2022 FINAL  Final    Radiology Studies: DG CHEST PORT 1 VIEW  Result Date: 07/20/2022 CLINICAL DATA:  Shortness of breath EXAM: PORTABLE CHEST 1 VIEW COMPARISON:  07/16/2022 FINDINGS: Right PICC is again noted in satisfactory position. Cardiac shadow is enlarged but stable accentuated by the portable technique. Aortic calcifications are noted. Left lung remains clear. Increasing right-sided infiltrate and effusion are noted. No bony abnormality is noted.  IMPRESSION: Increasing right basilar infiltrate with associated effusion. Electronically Signed   By: Alcide Clever M.D.   On: 07/20/2022 12:35    Scheduled  Meds:  apixaban  5 mg Oral BID   atorvastatin  80 mg Oral Daily   budesonide (PULMICORT) nebulizer solution  0.5 mg Nebulization BID   busPIRone  10 mg Oral BID   carvedilol  3.125 mg Oral BID   Chlorhexidine Gluconate Cloth  6 each Topical Daily   clopidogrel  75 mg Oral Q breakfast   digoxin  125 mcg Oral Daily   famotidine  20 mg Oral BID   insulin aspart  0-20 Units Subcutaneous TID WC   insulin glargine-yfgn  20 Units Subcutaneous Daily   ipratropium-albuterol  3 mL Nebulization BID   lidocaine  1 patch Transdermal Daily   nystatin  5 mL Oral QID   polyethylene glycol  17 g Oral BID   senna-docusate  2 tablet Oral BID   sodium chloride flush  10-40 mL Intracatheter Q12H   Continuous Infusions:   LOS: 8 days   Marguerita Merles, DO [facility] Available via Epic secure chat 7am-7pm After these hours, please refer to coverage provider listed on [email or web address] 07/20/2022, 6:55 PM

## 2022-07-20 NOTE — Progress Notes (Signed)
Mobility Specialist - Progress Note   07/20/22 1543  Mobility  Activity Ambulated with assistance in hallway  Level of Assistance Standby assist, set-up cues, supervision of patient - no hands on  Assistive Device Other (Comment) (IV Pole)  Distance Ambulated (ft) 300 ft  Activity Response Tolerated well  Mobility Referral Yes  $Mobility charge 1 Mobility   Pt received in bed and agreeable to mobility. No complaints during session. Pt to recliner after session with all needs met.    [facility]

## 2022-07-21 DIAGNOSIS — M546 Pain in thoracic spine: Secondary | ICD-10-CM

## 2022-07-21 DIAGNOSIS — I4891 Unspecified atrial fibrillation: Secondary | ICD-10-CM

## 2022-07-21 DIAGNOSIS — Z794 Long term (current) use of insulin: Secondary | ICD-10-CM

## 2022-07-21 DIAGNOSIS — E871 Hypo-osmolality and hyponatremia: Secondary | ICD-10-CM

## 2022-07-21 DIAGNOSIS — I10 Essential (primary) hypertension: Secondary | ICD-10-CM

## 2022-07-21 DIAGNOSIS — G4730 Sleep apnea, unspecified: Secondary | ICD-10-CM

## 2022-07-21 DIAGNOSIS — I5020 Unspecified systolic (congestive) heart failure: Secondary | ICD-10-CM

## 2022-07-21 DIAGNOSIS — E119 Type 2 diabetes mellitus without complications: Secondary | ICD-10-CM

## 2022-07-21 LAB — COMPREHENSIVE METABOLIC PANEL
ALT: 27 U/L (ref 0–44)
AST: 22 U/L (ref 15–41)
Albumin: 2.1 g/dL — ABNORMAL LOW (ref 3.5–5.0)
Alkaline Phosphatase: 114 U/L (ref 38–126)
Anion gap: 7 (ref 5–15)
BUN: 29 mg/dL — ABNORMAL HIGH (ref 6–20)
CO2: 22 mmol/L (ref 22–32)
Calcium: 8.1 mg/dL — ABNORMAL LOW (ref 8.9–10.3)
Chloride: 94 mmol/L — ABNORMAL LOW (ref 98–111)
Creatinine, Ser: 1.46 mg/dL — ABNORMAL HIGH (ref 0.61–1.24)
GFR, Estimated: 55 mL/min — ABNORMAL LOW (ref >60)
Glucose, Bld: 243 mg/dL — ABNORMAL HIGH (ref 70–99)
Potassium: 4 mmol/L (ref 3.5–5.1)
Sodium: 123 mmol/L — ABNORMAL LOW (ref 135–145)
Total Bilirubin: 1.3 mg/dL — ABNORMAL HIGH (ref 0.3–1.2)
Total Protein: 6.1 g/dL — ABNORMAL LOW (ref 6.5–8.1)

## 2022-07-21 LAB — CBC WITH DIFFERENTIAL/PLATELET
Abs Immature Granulocytes: 0.15 10*3/uL — ABNORMAL HIGH (ref 0.00–0.07)
Basophils Absolute: 0.1 10*3/uL (ref 0.0–0.1)
Basophils Relative: 1 %
Eosinophils Absolute: 0.1 10*3/uL (ref 0.0–0.5)
Eosinophils Relative: 1 %
HCT: 29.8 % — ABNORMAL LOW (ref 39.0–52.0)
Hemoglobin: 10 g/dL — ABNORMAL LOW (ref 13.0–17.0)
Immature Granulocytes: 1 %
Lymphocytes Relative: 10 %
Lymphs Abs: 1.1 10*3/uL (ref 0.7–4.0)
MCH: 30.7 pg (ref 26.0–34.0)
MCHC: 33.6 g/dL (ref 30.0–36.0)
MCV: 91.4 fL (ref 80.0–100.0)
Monocytes Absolute: 1.5 10*3/uL — ABNORMAL HIGH (ref 0.1–1.0)
Monocytes Relative: 14 %
Neutro Abs: 7.9 10*3/uL — ABNORMAL HIGH (ref 1.7–7.7)
Neutrophils Relative %: 73 %
Platelets: 335 10*3/uL (ref 150–400)
RBC: 3.26 MIL/uL — ABNORMAL LOW (ref 4.22–5.81)
RDW: 13.9 % (ref 11.5–15.5)
WBC: 10.8 10*3/uL — ABNORMAL HIGH (ref 4.0–10.5)
nRBC: 0 % (ref 0.0–0.2)

## 2022-07-21 LAB — GLUCOSE, CAPILLARY
Glucose-Capillary: 177 mg/dL — ABNORMAL HIGH (ref 70–99)
Glucose-Capillary: 212 mg/dL — ABNORMAL HIGH (ref 70–99)
Glucose-Capillary: 170 mg/dL — ABNORMAL HIGH (ref 70–99)
Glucose-Capillary: 179 mg/dL — ABNORMAL HIGH (ref 70–99)

## 2022-07-21 LAB — PHOSPHORUS: Phosphorus: 3.3 mg/dL (ref 2.5–4.6)

## 2022-07-21 LAB — OSMOLALITY, URINE: Osmolality, Ur: 298 mOsm/kg — ABNORMAL LOW (ref 300–900)

## 2022-07-21 LAB — MAGNESIUM: Magnesium: 1.9 mg/dL (ref 1.7–2.4)

## 2022-07-21 MED ORDER — HYDROMORPHONE HCL 1 MG/ML IJ SOLN
1.0000 mg | INTRAMUSCULAR | Status: DC | PRN
  Administered 2022-07-22 – 2022-08-12 (×87): 1 mg via INTRAVENOUS
  Filled 2022-07-21 (×24): qty 1
  Filled 2022-07-21: qty 0.5
  Filled 2022-07-21 (×44): qty 1
  Filled 2022-07-21: qty 0.5
  Filled 2022-07-21 (×19): qty 1

## 2022-07-21 MED ORDER — FUROSEMIDE 10 MG/ML IJ SOLN
20.0000 mg | Freq: Two times a day (BID) | INTRAMUSCULAR | Status: DC
  Administered 2022-07-21: 20 mg via INTRAVENOUS
  Filled 2022-07-21: qty 2

## 2022-07-21 MED ORDER — FUROSEMIDE 10 MG/ML IJ SOLN: 40.0000 mg | Freq: Two times a day (BID) | INTRAMUSCULAR | Status: DC

## 2022-07-21 MED ORDER — FUROSEMIDE 10 MG/ML IJ SOLN
20.0000 mg | Freq: Two times a day (BID) | INTRAMUSCULAR | Status: DC
  Administered 2022-07-22 – 2022-07-24 (×5): 20 mg via INTRAVENOUS
  Filled 2022-07-21 (×6): qty 2

## 2022-07-21 MED ORDER — TOLVAPTAN 15 MG PO TABS
15.0000 mg | ORAL_TABLET | Freq: Once | ORAL | Status: AC
  Administered 2022-07-21: 15 mg via ORAL
  Filled 2022-07-21: qty 1

## 2022-07-21 NOTE — Progress Notes (Signed)
PROGRESS NOTE    David [name]  OZH:[id number] DOB: March 03, 1961 DOA: 07/12/2022 PCP: [name], MD   Brief Narrative:  The patient is a 62 y.o. 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 His appetite has been decreased. He had a fever at home of 105 F. He was having significant chills and rigors.  His rigors were so intense that produced the above-mentioned back pain.  He was found to have recurrent MRSA bacteremia but given that no vegetations on his TEE ID recommended 2 weeks of total antibiotics but given his financial issues they have given him a dose of oritavancin now.  Sodium remains on the low side.  Nephrology was consulted and now they have started the patient on Lasix twice daily IV and also given 1 dose of tolvaptan 15 mg x 1   Assessment and Plan: Sepsis due to MRSA bacteremia -Blood cultures obtained on admission showing MRSA.   -He has a history of MRSA bacteremia in 2023 felt to be due to diabetic toe infections.   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 [name].  -Due to severe back pain he underwent imaging, MRI of the T and L-spine with nonspecific findings, no osteomyelitis or discitis.   -2D echo and TEE without vegetations.   -WBC Trend: Recent Labs  Lab 07/13/22 0419 07/14/22 0525 07/15/22 0755 07/17/22 0300 07/18/22 0221 07/20/22 1010 07/21/22 0413  WBC 19.1* 22.6* 18.0* 15.9* 14.7* 14.1* 10.8*  -ID recommends total of 2 weeks of antibiotics.   -PICC line placed, and there were plans in place for him to get home health,  however due to financial reasons this could not be arranged and patient received a dose of oritavancin on 4/15.   -PICC can be removed upon discharge   Right foot ulcer  -At the site of prior ray amputation.  Patient tells me that it is improving.   -Plain imaging without osteomyelitis, wound looks good without any evidence of deep infection -Already received Abx as above -Obtain WOC consult   Severe Back pain  -Lumbar MRI with diffuse edema involving the lower posterior paraspinous musculature, nonspecific, acute myositis possible infectious versus inflammatory.   -MRI T-spine done and showed "No MRI evidence for acute infection within the thoracic spine. Small layering bilateral pleural effusions. Mild chronic compression deformities about the T2 through T6 as well as the T9 vertebral bodies. Underlying mild for age thoracic spondylosis without significant stenosis or neural impingement." -There is no loculated or drainable fluid collections.  Negative for discitis/osteomyelitis.   -Continue oxycodone, Flexeril made him loopy and he was switched to Robaxin: Also gets IV hydrocodone and increase the frequency given his continued back discomfort and pain -C/w bowel regimen  -May need to discuss with neurosurgery about back bracing   Hyponatremia  -Stable for most part but with sudden worsening of 121 on 4/15, after a bolus of fluids for hypotension (possibly induced by pain medications).   -He also tells me he drinks large amounts of water several pitchers per day.   -He was placed on water restriction, sodium slightly better, continue to monitor.   -Na+ Trend:  Recent Labs  Lab 07/15/22 0755 07/17/22 0300 07/18/22 0221 07/18/22 2002 07/19/22 0423 07/20/22 0445 07/21/22 0413  NA 125* 126* 121* 121* 122* 123* 123*  -His diuretics had been discontinued but he appeared volume overloaded so will give him IV Lasix 20 mg x1 this was done yesterday and nephrology was consulted formally and  now he is getting IV Lasix 20 mg twice daily and also a dose of 50 mg of tolvaptan x 1 -Baseline is around 128-130.  If he gets in the upper 120s could potentially go home  -Appreciate nephrology assistance and help   Essential Hypertension  -Continue his Sherryll Burger was held due to Hypotension, Coreg dose decreased. -Continue to Monitor BP per Protocol  -Last blood pressure reading was on the softer side at 114/82   CKD 3A Metabolic Acidosis -Baseline creatinine 1.2-1.5 -BUN/Cr Trend: Recent Labs  Lab 07/14/22 0525 07/15/22 0755 07/17/22 0300 07/18/22 0221 07/19/22 0423 07/20/22 0445 07/21/22 0413  BUN 38* 30* 23* 21* 22* 25* 29*  CREATININE 1.24 1.23 1.07 1.17 1.13 1.42* 1.46*  -CO2 was 21, anion gap was 9, chloride level was 93 -Avoid Nephrotoxic Medications, Contrast Dyes, Hypotension and Dehydration to Ensure Adequate Renal Perfusion and will need to Renally Adjust Meds -Continue to Monitor and Trend Renal Function carefully and repeat CMP in the AM    Hypomagnesemia -Patient's Mag Level Trend: Recent Labs  Lab 07/14/22 0525 07/17/22 0300 07/20/22 0445 07/21/22 0413  MG 2.1 2.0 1.9 1.9  -Replete with IV Mag Sulfate 2 grams -Continue to Monitor and Replete as Necessary -Repeat Mag in the AM    Hyperbilirubinemia -Bilirubin Trend: Recent Labs  Lab 07/09/22 1756 07/12/22 0850 07/13/22 0419 07/14/22 0525 07/17/22 0300 07/20/22 1010 07/21/22 0413  BILITOT 1.6* 1.4* 1.0 0.9 1.2 1.9* 1.3*  -Continue to Monitor and Trend and Repeat CMP in the AM   Atrial Fibrillation -Rate controlled, continue Eliquis, Coreg.   -Continue to Monitor on Telemetry  -Followed by cardiology as an outpatient   Chronic Combined CHF  -His EF has improved and now normalized.  Diuretics were discontinued due to his hyponatremia, continue fluid restriction, Entresto also held due to hypotension.   -He appeared volume overloaded yesterday so we will give him a dose of IV Lasix 20 mg x 1  and again appears volume overloaded today -Strict I's and O's and Daily Weights  Intake/Output Summary (Last 24 hours) at 07/21/2022 1858 Last data filed at 07/21/2022 1700 Gross per 24 hour  Intake --  Output 1925 ml  Net -1925 ml  -Nephrology was consulted given his hyponatremia and he is now getting diuresis with IV Lasix 20 g twice daily -Will need cardiology follow-up closely  Hallucinations -Nursing staff states that the patient was hallucinating about bugs -Continue to monitor and placed on delirium precautions -May need head imaging if continues to persist   Normocytic Anemia -Hgb/Hct Trend Recent Labs  Lab 07/13/22 0419 07/14/22 0525 07/15/22 0755 07/17/22 0300 07/18/22 0221 07/20/22 1010 07/21/22 0413  HGB 12.3* 12.0* 13.5 12.4* 12.2* 11.4* 10.0*  HCT 36.7* 36.8* 40.6 36.9* 36.0* 33.1* 29.8*  MCV 94.3 95.6 94.4 92.9 92.3 90.9 91.4  -Check Anemia Panel in the AM    Hyperlipidemia  -Continue Atorvastatin 80 mg po Daily   IDDM, poorly controlled, with hyperglycemia -Continue Insulin Glargine 20 units sq Daily -C/w Resistant Novolog SSI AC -Continue to Monitor CBGs carefully; CBG Trend: Recent Labs  Lab 07/20/22 0743 07/20/22 1148 07/20/22 1615 07/20/22 2155 07/21/22 0757 07/21/22 1122 07/21/22 1715  GLUCAP 133* 141* 111* 187* 177* 212* 170*    Hypoalbuminemia -Patient's Albumin Trend: Recent Labs  Lab 07/09/22 1756 07/12/22 0850 07/13/22 0419 07/14/22 0525 07/17/22 0300 07/20/22 1010 07/21/22 0413  ALBUMIN 3.1* 3.0* 2.3* 2.5* 2.4* 2.3* 2.1*  -Continue to Monitor and Trend and repeat CMP in the AM   DVT prophylaxis:  apixaban (ELIQUIS) tablet 5 mg    Code Status: Full Code Family Communication: No family currently at bedside  Disposition Plan:  Level of care: Med-Surg Status is: Inpatient Remains inpatient appropriate because: His further clinical improvement in his sodium and close monitoring   Consultants:  Nephrology Infectious  diseases  Procedures:  As delineated as above  Antimicrobials:  Anti-infectives (From admission, onward)    Start     Dose/Rate Route Frequency Ordered Stop   07/18/22 1500  Oritavancin Diphosphate (ORBACTIV) 1,200 mg in dextrose 5 % IVPB        1,200 mg 333.3 mL/hr over 180 Minutes Intravenous Once 07/18/22 1351 07/18/22 1822   07/16/22 0000  daptomycin (CUBICIN) IVPB  Status:  Discontinued        650 mg Intravenous Every 24 hours 07/16/22 1116 07/19/22    07/15/22 2000  DAPTOmycin (CUBICIN) 650 mg in sodium chloride 0.9 % IVPB  Status:  Discontinued        8 mg/kg  81 kg 126 mL/hr over 30 Minutes Intravenous Daily 07/15/22 1411 07/15/22 1413   07/15/22 2000  DAPTOmycin (CUBICIN) 650 mg in sodium chloride 0.9 % IVPB  Status:  Discontinued        8 mg/kg  81 kg 126 mL/hr over 30 Minutes Intravenous Daily 07/15/22 1413 07/18/22 1351   07/14/22 0200  vancomycin (VANCOREADY) IVPB 1750 mg/350 mL  Status:  Discontinued        1,750 mg 175 mL/hr over 120 Minutes Intravenous Every 24 hours 07/13/22 0718 07/15/22 1411   07/13/22 1000  cefTRIAXone (ROCEPHIN) 1 g in sodium chloride 0.9 % 100 mL IVPB  Status:  Discontinued        1 g 200 mL/hr over 30 Minutes Intravenous Every 24 hours 07/12/22 1405 07/13/22 0239   07/13/22 1000  azithromycin (ZITHROMAX) 500 mg in sodium chloride 0.9 % 250 mL IVPB  Status:  Discontinued        500 mg 250 mL/hr over 60 Minutes Intravenous Every 24 hours 07/12/22 1405 07/13/22 0239   07/13/22 0330  vancomycin (VANCOREADY) IVPB 1750 mg/350 mL        1,750 mg 175 mL/hr over 120 Minutes Intravenous  Once 07/13/22 0240 07/13/22 0517   07/12/22 0845  cefTRIAXone (ROCEPHIN) 1 g in sodium chloride 0.9 % 100 mL IVPB        1 g 200 mL/hr over 30 Minutes Intravenous  Once 07/12/22 0844 07/12/22 1218   07/12/22 0845  azithromycin (ZITHROMAX) 500 mg in sodium chloride 0.9 % 250 mL IVPB        500 mg 250 mL/hr over 60 Minutes Intravenous  Once 07/12/22 0844 07/12/22  1218       Subjective: Seen and examined at bedside and was complaining of continued back discomfort that radiates from the left side all the way around his chest.  No nausea or vomiting. Doing okay but states his pain is severely uncontrolled and also complains of right shoulder pain.  No other concerns or complaints at this time.  Objective: Vitals:   07/21/22 0646 07/21/22 0808 07/21/22 1100 07/21/22 1302  BP: 107/77   114/82  Pulse:  92  99 86  Resp: 18   (!) 22  Temp: 98.6 F (37 C)   99.7 F (37.6 C)  TempSrc:    Oral  SpO2: 95% 97%  95%  Weight:      Height:        Intake/Output Summary (Last 24 hours) at 07/21/2022 1801 Last data filed at 07/21/2022 1700 Gross per 24 hour  Intake --  Output 1925 ml  Net -1925 ml   Filed Weights   07/12/22 0824 07/12/22 1219  Weight: 81 kg 81 kg   Examination: Physical Exam:  Constitutional: WN/WD Caucasian male in no acute distress but does appear little uncomfortable Respiratory: Diminished to auscultation bilaterally with some coarse breath sounds, no wheezing, rales, rhonchi or crackles. Normal respiratory effort and patient is not tachypenic. No accessory muscle use.  Unlabored breathing Cardiovascular: RRR, no murmurs / rubs / gallops. S1 and S2 auscultated. No extremity edema. Abdomen: Soft, non-tender, distended secondary to body habitus slightly. Bowel sounds positive.  GU: Deferred. Musculoskeletal: No clubbing / cyanosis of digits/nails.  Is no significant changes and has a 4 ray amputation of his right foot Skin: No rashes, lesions, ulcers. No induration; Warm and dry.  Neurologic: CN 2-12 grossly intact with no focal deficits. Romberg sign and cerebellar reflexes not assessed.  Psychiatric: Normal judgment and insight. Alert and oriented x 3. Normal mood and appropriate affect.   Data Reviewed: I have personally reviewed following labs and imaging studies  CBC: Recent Labs  Lab 07/15/22 0755 07/17/22 0300  07/18/22 0221 07/20/22 1010 07/21/22 0413  WBC 18.0* 15.9* 14.7* 14.1* 10.8*  NEUTROABS  --   --   --  10.8* 7.9*  HGB 13.5 12.4* 12.2* 11.4* 10.0*  HCT 40.6 36.9* 36.0* 33.1* 29.8*  MCV 94.4 92.9 92.3 90.9 91.4  PLT 302 321 353 364 335   Basic Metabolic Panel: Recent Labs  Lab 07/17/22 0300 07/18/22 0221 07/18/22 2002 07/19/22 0423 07/20/22 0445 07/20/22 1010 07/21/22 0413  NA 126* 121* 121* 122* 123*  --  123*  K 4.3 4.1  --  4.4 4.7  --  4.0  CL 96* 93*  --  94* 93*  --  94*  CO2 23 21*  --  22 21*  --  22  GLUCOSE 241* 172*  --  203* 127*  --  243*  BUN 23* 21*  --  22* 25*  --  29*  CREATININE 1.07 1.17  --  1.13 1.42*  --  1.46*  CALCIUM 8.8* 8.7*  --  8.3* 8.7*  --  8.1*  MG 2.0  --   --   --  1.9  --  1.9  PHOS  --   --   --   --   --  3.5 3.3   GFR: Estimated Creatinine Clearance: 59.1 mL/min (A) (by C-G formula based on SCr of 1.46 mg/dL (H)). Liver Function Tests: Recent Labs  Lab 07/17/22 0300 07/20/22 1010 07/21/22 0413  AST 20 22 22   ALT 28 29 27   ALKPHOS 115 114 114  BILITOT 1.2 1.9* 1.3*  PROT 6.7 6.7 6.1*  ALBUMIN 2.4* 2.3* 2.1*   No results for input(s): "LIPASE", "AMYLASE" in the last 168 hours. No results for input(s): "AMMONIA" in the last 168 hours. Coagulation Profile: No results for input(s): "INR", "PROTIME" in the last 168 hours. Cardiac Enzymes: Recent Labs  Lab 07/16/22 0552  CKTOTAL 20*   BNP (last 3 results) No results for input(s): "PROBNP" in  the last 8760 hours. HbA1C: No results for input(s): "HGBA1C" in the last 72 hours. CBG: Recent Labs  Lab 07/20/22 1615 07/20/22 2155 07/21/22 0757 07/21/22 1122 07/21/22 1715  GLUCAP 111* 187* 177* 212* 170*   Lipid Profile: No results for input(s): "CHOL", "HDL", "LDLCALC", "TRIG", "CHOLHDL", "LDLDIRECT" in the last 72 hours. Thyroid Function Tests: No results for input(s): "TSH", "T4TOTAL", "FREET4", "T3FREE", "THYROIDAB" in the last 72 hours. Anemia Panel: No results  for input(s): "VITAMINB12", "FOLATE", "FERRITIN", "TIBC", "IRON", "RETICCTPCT" in the last 72 hours. Sepsis Labs: No results for input(s): "PROCALCITON", "LATICACIDVEN" in the last 168 hours.  Recent Results (from the past 240 hour(s))  Blood culture (routine x 2)     Status: Abnormal   Collection Time: 07/12/22  8:48 AM   Specimen: BLOOD RIGHT ARM  Result Value Ref Range Status   Specimen Description   Final    BLOOD RIGHT ARM Performed at [facility], [address] [place], [place] [address]    Special Requests   Final    BOTTLES DRAWN AEROBIC AND ANAEROBIC Blood Culture results may not be optimal due to an inadequate volume of blood received in culture bottles Performed at [facility], [address], [place], [place] [address]    Culture  Setup Time   Final    GRAM POSITIVE COCCI IN CLUSTERS IN BOTH AEROBIC AND ANAEROBIC BOTTLES CRITICAL VALUE NOTED.  VALUE IS CONSISTENT WITH PREVIOUSLY REPORTED AND CALLED VALUE.    Culture (A)  Final    STAPHYLOCOCCUS AUREUS SUSCEPTIBILITIES PERFORMED ON PREVIOUS CULTURE WITHIN THE LAST 5 DAYS. Performed at [facility], [address], [place], [place] [address]    Report Status 07/14/2022 FINAL  Final  Blood culture (routine x 2)     Status: Abnormal   Collection Time: 07/12/22  8:50 AM   Specimen: BLOOD  Result Value Ref Range Status   Specimen Description   Final    BLOOD BLOOD LEFT ARM Performed at [facility], [address] [place] [address], [place], [place] [address]    Special Requests   Final    BOTTLES DRAWN AEROBIC AND ANAEROBIC Blood Culture results may not be optimal due to an inadequate volume of blood received in culture bottles Performed at [facility], [address], [place], [place] [address]    Culture  Setup Time   Final    GRAM POSITIVE COCCI IN CLUSTERS IN BOTH AEROBIC AND ANAEROBIC BOTTLES CRITICAL RESULT CALLED TO, READ BACK BY AND VERIFIED WITH: PHARMD  M. Doran Durand 07/13/22 @ 0152 BY AB    Culture (A)  Final    METHICILLIN RESISTANT STAPHYLOCOCCUS AUREUS SEE SEPARATE REPORT FOR DAPTOMYCIN Performed at [facility], [address], [place], [place] [address]    Report Status 07/19/2022 FINAL  Final   Organism ID, Bacteria METHICILLIN RESISTANT STAPHYLOCOCCUS AUREUS  Final      Susceptibility   Methicillin resistant staphylococcus aureus - MIC*    CIPROFLOXACIN >=8 RESISTANT Resistant     ERYTHROMYCIN >=8 RESISTANT Resistant     GENTAMICIN <=0.5 SENSITIVE Sensitive     OXACILLIN >=4 RESISTANT Resistant     TETRACYCLINE <=1 SENSITIVE Sensitive     VANCOMYCIN <=0.5 SENSITIVE Sensitive     TRIMETH/SULFA >=320 RESISTANT Resistant     CLINDAMYCIN <=0.25 SENSITIVE Sensitive     RIFAMPIN <=0.5 SENSITIVE Sensitive     Inducible Clindamycin NEGATIVE Sensitive     * METHICILLIN RESISTANT STAPHYLOCOCCUS AUREUS  Blood Culture ID Panel (Reflexed)     Status: Abnormal   Collection Time: 07/12/22  8:50 AM  Result Value Ref Range Status   Enterococcus faecalis NOT DETECTED NOT DETECTED Final   Enterococcus Faecium NOT DETECTED NOT DETECTED Final   Listeria monocytogenes NOT DETECTED NOT DETECTED Final   Staphylococcus species DETECTED (A) NOT DETECTED Final    Comment: CRITICAL RESULT CALLED TO, READ BACK BY AND VERIFIED WITH: PHARMD M. LILLISTON 07/13/22 @ 0152 BY AB    Staphylococcus aureus (BCID) DETECTED (A) NOT DETECTED Final    Comment: Methicillin (oxacillin)-resistant Staphylococcus aureus (MRSA). MRSA is predictably resistant to beta-lactam antibiotics (except ceftaroline). Preferred therapy is vancomycin unless clinically contraindicated. Patient requires contact precautions if  hospitalized. CRITICAL RESULT CALLED TO, READ BACK BY AND VERIFIED WITH: PHARMD M. LILLISTON 07/13/22 @ 0152 BY AB    Staphylococcus epidermidis NOT DETECTED NOT DETECTED Final   Staphylococcus lugdunensis NOT DETECTED NOT DETECTED Final   Streptococcus  species NOT DETECTED NOT DETECTED Final   Streptococcus agalactiae NOT DETECTED NOT DETECTED Final   Streptococcus pneumoniae NOT DETECTED NOT DETECTED Final   Streptococcus pyogenes NOT DETECTED NOT DETECTED Final   A.calcoaceticus-baumannii NOT DETECTED NOT DETECTED Final   Bacteroides fragilis NOT DETECTED NOT DETECTED Final   Enterobacterales NOT DETECTED NOT DETECTED Final   Enterobacter cloacae complex NOT DETECTED NOT DETECTED Final   Escherichia coli NOT DETECTED NOT DETECTED Final   Klebsiella aerogenes NOT DETECTED NOT DETECTED Final   Klebsiella oxytoca NOT DETECTED NOT DETECTED Final   Klebsiella pneumoniae NOT DETECTED NOT DETECTED Final   Proteus species NOT DETECTED NOT DETECTED Final   Salmonella species NOT DETECTED NOT DETECTED Final   Serratia marcescens NOT DETECTED NOT DETECTED Final   Haemophilus influenzae NOT DETECTED NOT DETECTED Final   Neisseria meningitidis NOT DETECTED NOT DETECTED Final   Pseudomonas aeruginosa NOT DETECTED NOT DETECTED Final   Stenotrophomonas maltophilia NOT DETECTED NOT DETECTED Final   Candida albicans NOT DETECTED NOT DETECTED Final   Candida auris NOT DETECTED NOT DETECTED Final   Candida glabrata NOT DETECTED NOT DETECTED Final   Candida krusei NOT DETECTED NOT DETECTED Final   Candida parapsilosis NOT DETECTED NOT DETECTED Final   Candida tropicalis NOT DETECTED NOT DETECTED Final   Cryptococcus neoformans/gattii NOT DETECTED NOT DETECTED Final   Meth resistant mecA/C and MREJ DETECTED (A) NOT DETECTED Final    Comment: CRITICAL RESULT CALLED TO, READ BACK BY AND VERIFIED WITH: PHARMD M. LILLISTON 07/13/22 @ 0152 BY AB Performed at [facility], [address], [place], [place] [address]   SARS Coronavirus 2 by RT PCR (hospital order, performed in [facility] hospital lab) *cepheid single result test* Anterior Nasal Swab     Status: None   Collection Time: 07/12/22  9:24 AM   Specimen: Anterior Nasal Swab  Result Value Ref  Range Status   SARS Coronavirus 2 by RT PCR NEGATIVE NEGATIVE Final    Comment: (NOTE) SARS-CoV-2 target nucleic acids are NOT DETECTED.  The SARS-CoV-2 RNA is generally detectable in upper and lower respiratory specimens during the acute phase of infection. The lowest concentration of SARS-CoV-2 viral copies this assay can detect is 250 copies / mL. A negative result does not preclude SARS-CoV-2 infection and should not be used as the sole basis for treatment or other patient management decisions.  A negative result may occur with improper specimen collection / handling, submission of specimen other than nasopharyngeal swab, presence of viral mutation(s) within  the areas targeted by this assay, and inadequate number of viral copies (<250 copies / mL). A negative result must be combined with clinical observations, patient history, and epidemiological information.  Fact Sheet for Patients:   RoadLapTop.co.za  Fact Sheet for Healthcare Providers: http://kim-miller.com/  This test is not yet approved or  cleared by the [place] FDA and has been authorized for detection and/or diagnosis of SARS-CoV-2 by FDA under an Emergency Use Authorization (EUA).  This EUA will remain in effect (meaning this test can be used) for the duration of the COVID-19 declaration under Section 564(b)(1) of the Act, 21 U.S.C. section 360bbb-3(b)(1), unless the authorization is terminated or revoked sooner.  Performed at [facility], [address] [place], [place] [address]   Respiratory (~20 pathogens) panel by PCR     Status: None   Collection Time: 07/12/22  2:03 PM   Specimen: Nasopharyngeal Swab; Respiratory  Result Value Ref Range Status   Adenovirus NOT DETECTED NOT DETECTED Final   Coronavirus 229E NOT DETECTED NOT DETECTED Final    Comment: (NOTE) The Coronavirus on the Respiratory Panel, DOES NOT test for the novel   Coronavirus (2019 nCoV)    Coronavirus HKU1 NOT DETECTED NOT DETECTED Final   Coronavirus NL63 NOT DETECTED NOT DETECTED Final   Coronavirus OC43 NOT DETECTED NOT DETECTED Final   Metapneumovirus NOT DETECTED NOT DETECTED Final   Rhinovirus / Enterovirus NOT DETECTED NOT DETECTED Final   Influenza A NOT DETECTED NOT DETECTED Final   Influenza B NOT DETECTED NOT DETECTED Final   Parainfluenza Virus 1 NOT DETECTED NOT DETECTED Final   Parainfluenza Virus 2 NOT DETECTED NOT DETECTED Final   Parainfluenza Virus 3 NOT DETECTED NOT DETECTED Final   Parainfluenza Virus 4 NOT DETECTED NOT DETECTED Final   Respiratory Syncytial Virus NOT DETECTED NOT DETECTED Final   Bordetella pertussis NOT DETECTED NOT DETECTED Final   Bordetella Parapertussis NOT DETECTED NOT DETECTED Final   Chlamydophila pneumoniae NOT DETECTED NOT DETECTED Final   Mycoplasma pneumoniae NOT DETECTED NOT DETECTED Final    Comment: Performed at [facility], [address], [place], [place] [address]  Minimum Inhibitory Conc. (1 Drug)     Status: None   Collection Time: 07/13/22  2:47 PM  Result Value Ref Range Status   Min Inhibitory Conc (1 Drug) Final report  Corrected    Comment: (NOTE) Performed At: [facility] [address] [place], [place] [id number] Jolene Schimke MD WG:[id number] CORRECTED ON 04/16 AT 1236: PREVIOUSLY REPORTED AS Preliminary report    Source CRE SAUR BLOOD  Final    Comment: Performed at [facility], [address] [place], [place] [address]  Culture, blood (Routine X 2) w Reflex to ID Panel     Status: None   Collection Time: 07/14/22  5:25 AM   Specimen: BLOOD  Result Value Ref Range Status   Specimen Description   Final    BLOOD LEFT ANTECUBITAL Performed at [facility], [address], [place], [place] [address]    Special Requests   Final    BOTTLES DRAWN AEROBIC AND ANAEROBIC Blood Culture adequate volume Performed at [facility], [address], [place], [place] [address]    Culture   Final    NO GROWTH 5 DAYS Performed at [facility], [address], [place], [place] [address]    Report Status 07/19/2022 FINAL  Final  Culture, blood (Routine X 2)  w Reflex to ID Panel     Status: None   Collection Time: 07/14/22  5:25 AM   Specimen: BLOOD  Result Value Ref Range Status   Specimen Description   Final    BLOOD BLOOD LEFT HAND Performed at [facility], [address] [place], [place] [address]    Special Requests   Final    BOTTLES DRAWN AEROBIC AND ANAEROBIC Blood Culture adequate volume Performed at [facility], [address] [place], [place] [address]    Culture   Final    NO GROWTH 5 DAYS Performed at [facility], [address], [place], [place] [address]    Report Status 07/19/2022 FINAL  Final    Radiology Studies: DG CHEST PORT 1 VIEW  Result Date: 07/20/2022 CLINICAL DATA:  Shortness of breath EXAM: PORTABLE CHEST 1 VIEW COMPARISON:  07/16/2022 FINDINGS: Right PICC is again noted in satisfactory position. Cardiac shadow is enlarged but stable accentuated by the portable technique. Aortic calcifications are noted. Left lung remains clear. Increasing right-sided infiltrate and effusion are noted. No bony abnormality is noted. IMPRESSION: Increasing right basilar infiltrate with associated effusion. Electronically Signed   By: Alcide Clever M.D.   On: 07/20/2022 12:35    Scheduled Meds:  apixaban  5 mg Oral BID   atorvastatin  80 mg Oral Daily   budesonide (PULMICORT) nebulizer solution  0.5 mg Nebulization BID   busPIRone  10 mg Oral BID   carvedilol  3.125 mg Oral BID   Chlorhexidine Gluconate Cloth  6 each Topical Daily   clopidogrel  75 mg Oral Q breakfast   digoxin  125 mcg Oral Daily   famotidine  20 mg Oral BID   [START ON 07/22/2022] furosemide  20 mg Intravenous Q12H   insulin aspart  0-20 Units Subcutaneous  TID WC   insulin glargine-yfgn  20 Units Subcutaneous Daily   lidocaine  1 patch Transdermal Daily   nystatin  5 mL Oral QID   polyethylene glycol  17 g Oral BID   senna-docusate  2 tablet Oral BID   sodium chloride flush  10-40 mL Intracatheter Q12H   tolvaptan  15 mg Oral Once   Continuous Infusions:   LOS: 9 days   Marguerita Merles, DO [facility] Available via Epic secure chat 7am-7pm After these hours, please refer to coverage provider listed on [email or web address] 07/21/2022, 6:01 PM

## 2022-07-21 NOTE — Consult Note (Signed)
WOC Nurse Consult Note: Reason for Consult:Right foot ulceration, chronic, nonhealing. Wound type:Infectious,neuropathic, full thickness Pressure Injury POA: N/A  Patient seen 9 days ago by this Clinical research associate. Orders for topical care are still appropriate and active.  Recommend follow up with outpatient wound care center and Podiatric Medicine upon discharge.  WOC nursing team will not follow, but will remain available to this patient, the nursing and medical teams.  Please re-consult if needed.  Thank you for inviting [place] to participate in this patient's Plan of Care.  Ladona Mow, MSN, RN, CNS, GNP, Leda Min, [employer], [employer] phone:  [phone]

## 2022-07-22 LAB — CBC WITH DIFFERENTIAL/PLATELET
Abs Immature Granulocytes: 0.08 10*3/uL — ABNORMAL HIGH (ref 0.00–0.07)
Basophils Absolute: 0 10*3/uL (ref 0.0–0.1)
Basophils Relative: 0 %
Eosinophils Absolute: 0.1 10*3/uL (ref 0.0–0.5)
Eosinophils Relative: 1 %
HCT: 30 % — ABNORMAL LOW (ref 39.0–52.0)
Hemoglobin: 10 g/dL — ABNORMAL LOW (ref 13.0–17.0)
Immature Granulocytes: 1 %
Lymphocytes Relative: 9 %
Lymphs Abs: 0.9 10*3/uL (ref 0.7–4.0)
MCH: 30.5 pg (ref 26.0–34.0)
MCHC: 33.3 g/dL (ref 30.0–36.0)
MCV: 91.5 fL (ref 80.0–100.0)
Monocytes Absolute: 1.4 10*3/uL — ABNORMAL HIGH (ref 0.1–1.0)
Monocytes Relative: 14 %
Neutro Abs: 7.9 10*3/uL — ABNORMAL HIGH (ref 1.7–7.7)
Neutrophils Relative %: 75 %
Platelets: 328 10*3/uL (ref 150–400)
RBC: 3.28 MIL/uL — ABNORMAL LOW (ref 4.22–5.81)
RDW: 13.8 % (ref 11.5–15.5)
WBC: 10.4 10*3/uL (ref 4.0–10.5)
nRBC: 0 % (ref 0.0–0.2)

## 2022-07-22 LAB — COMPREHENSIVE METABOLIC PANEL
ALT: 29 U/L (ref 0–44)
AST: 25 U/L (ref 15–41)
Albumin: 2.1 g/dL — ABNORMAL LOW (ref 3.5–5.0)
Alkaline Phosphatase: 130 U/L — ABNORMAL HIGH (ref 38–126)
Anion gap: 7 (ref 5–15)
BUN: 24 mg/dL — ABNORMAL HIGH (ref 6–20)
CO2: 20 mmol/L — ABNORMAL LOW (ref 22–32)
Calcium: 8.3 mg/dL — ABNORMAL LOW (ref 8.9–10.3)
Chloride: 98 mmol/L (ref 98–111)
Creatinine, Ser: 1.23 mg/dL (ref 0.61–1.24)
GFR, Estimated: >60 mL/min (ref >60)
Glucose, Bld: 174 mg/dL — ABNORMAL HIGH (ref 70–99)
Potassium: 4 mmol/L (ref 3.5–5.1)
Sodium: 125 mmol/L — ABNORMAL LOW (ref 135–145)
Total Bilirubin: 1 mg/dL (ref 0.3–1.2)
Total Protein: 6.5 g/dL (ref 6.5–8.1)

## 2022-07-22 LAB — IRON AND TIBC
Iron: 16 ug/dL — ABNORMAL LOW (ref 45–182)
Saturation Ratios: 8 % — ABNORMAL LOW (ref 17.9–39.5)
TIBC: 199 ug/dL — ABNORMAL LOW (ref 250–450)
UIBC: 183 ug/dL

## 2022-07-22 LAB — FERRITIN: Ferritin: 393 ng/mL — ABNORMAL HIGH (ref 24–336)

## 2022-07-22 LAB — MAGNESIUM: Magnesium: 1.9 mg/dL (ref 1.7–2.4)

## 2022-07-22 LAB — GLUCOSE, CAPILLARY
Glucose-Capillary: 199 mg/dL — ABNORMAL HIGH (ref 70–99)
Glucose-Capillary: 150 mg/dL — ABNORMAL HIGH (ref 70–99)
Glucose-Capillary: 225 mg/dL — ABNORMAL HIGH (ref 70–99)
Glucose-Capillary: 206 mg/dL — ABNORMAL HIGH (ref 70–99)

## 2022-07-22 LAB — FOLATE: Folate: 13.9 ng/mL (ref >5.9)

## 2022-07-22 LAB — RETICULOCYTES
Immature Retic Fract: 11.2 % (ref 2.3–15.9)
RBC.: 3.19 MIL/uL — ABNORMAL LOW (ref 4.22–5.81)
Retic Count, Absolute: 42.1 10*3/uL (ref 19.0–186.0)
Retic Ct Pct: 1.3 % (ref 0.4–3.1)

## 2022-07-22 LAB — VITAMIN B12: Vitamin B-12: 1066 pg/mL — ABNORMAL HIGH (ref 180–914)

## 2022-07-22 LAB — PHOSPHORUS: Phosphorus: 3 mg/dL (ref 2.5–4.6)

## 2022-07-22 MED ORDER — MAGIC MOUTHWASH W/LIDOCAINE: 5.0000 mL | Freq: Four times a day (QID) | ORAL | Status: DC | PRN

## 2022-07-22 NOTE — Progress Notes (Signed)
[facility] Progress Note  Subjective: seen in room, seems a little more comfortable w/ his breathing. States he is voiding "a lot !" And his SOB is starting to improve. No new c/o's.   Vitals:   07/21/22 2055 07/22/22 0438 07/22/22 0500 07/22/22 0825  BP:  106/76  118/69  Pulse:  77  96  Resp:  18    Temp:  99.7 F (37.6 C)    TempSrc:  Oral    SpO2: 96% 95%  96%  Weight:   86 kg   Height:        Exam: Gen alert, no distress No rash, cyanosis or gangrene Sclera anicteric, throat clear  +jvd Chest rales bilat bases RRR no MRG Abd soft ntnd no mass or ascites +bs GU normal male MS no joint effusions or deformity Ext 1-2+ pretib edema bilat, not much better yet Neuro is alert, Ox 3 , nf      Date                            Creat               eGFR  2019- 2020                 0.99- 1.24  2021                           0.97- 1.20  2022                           1.32  Jan- June 2023          0.96- 1.35        > 60   Jul- dec 2023             1.40- 1.73        45- 50 ml/min    4/06                            1.38, admit        4/10                            1.32                 > 60 ml/min  4/15                            1.17  4/17                            1.42                 57 ml/min  4/18                            1.46                 55 ml/min       Total I/o here = 10 L in and 21 L out = net neg 11 L   Wt's 4/09 - 81kg      CXR 4/13 - IMPRESSION: Ill-defined opacity at the RIGHT lung base, atelectasis  versus pneumonia. Ground-glass opacities within the LEFT upper lung, suspicious for multifocal pneumonia.    CXR 4/17 - IMPRESSION: Increasing right basilar infiltrate with associated effusion. (My read = +vasc congestion)    UNa < 10, UOsm 298   UCreat 108    Assessment/ Plan: Hyponatremia - hypervolemic hyponatremia, suspected decomp diast CHF. UNa is low c/w CHF exacerbation. Not much of a response to tolvaptan  given yesterday. Na+ today  is up to 125. Will hold off on further dosing. Will cont IV lasix 20 mg bid and see if Na+ comes up w/ diuresis.  AKI on CKD 3a - b/l creat 1.00- 1.40 from 2023, 45- >60 ml/min. Creat here in his normal range. Cont diuresis.  Vol overload - cont IV lasix 20 mg bid, having a good response w/ 2500 cc out yest and today the same so far. Creat stable.   MRSA bacteremia - on 2 wk course of IV abx DM2 - on insulin, sig complications H/o osteomyelitis R foot - sp ray amp prior admission HTN - per pmd Atrial fib   Vinson Moselle MD [facility] 07/22/2022, 2:29 PM  Recent Labs  Lab 07/21/22 0413 07/22/22 0306  HGB 10.0* 10.0*  ALBUMIN 2.1* 2.1*  CALCIUM 8.1* 8.3*  PHOS 3.3 3.0  CREATININE 1.46* 1.23  K 4.0 4.0   Recent Labs  Lab 07/22/22 0306  IRON 16*  TIBC 199*  FERRITIN 393*   Inpatient medications:  apixaban  5 mg Oral BID   atorvastatin  80 mg Oral Daily   budesonide (PULMICORT) nebulizer solution  0.5 mg Nebulization BID   busPIRone  10 mg Oral BID   carvedilol  3.125 mg Oral BID   Chlorhexidine Gluconate Cloth  6 each Topical Daily   clopidogrel  75 mg Oral Q breakfast   digoxin  125 mcg Oral Daily   famotidine  20 mg Oral BID   furosemide  20 mg Intravenous Q12H   insulin aspart  0-20 Units Subcutaneous TID WC   insulin glargine-yfgn  20 Units Subcutaneous Daily   lidocaine  1 patch Transdermal Daily   nystatin  5 mL Oral QID   polyethylene glycol  17 g Oral BID   senna-docusate  2 tablet Oral BID   sodium chloride flush  10-40 mL Intracatheter Q12H    acetaminophen **OR** acetaminophen, Glycerin (Adult), HYDROmorphone (DILAUDID) injection, hydrOXYzine, ipratropium-albuterol, magic mouthwash w/lidocaine, methocarbamol, Muscle Rub, ondansetron **OR** ondansetron (ZOFRAN) IV, mouth rinse, oxyCODONE, sodium chloride flush, sorbitol, milk of mag, mineral oil, glycerin (SMOG) enema

## 2022-07-22 NOTE — Progress Notes (Signed)
Chaplain responded to consult for spiritual care. Pt was tearful as he talked about recent health challenges that have led to financial challenges due to loss of work. I provided reflective listening, prayer and empathic response. Continued chaplain support is available by paging chaplain services or submitting a spiritual care consult.  Chaplain [place], [place]   07/22/22 1515  Spiritual Encounters  Type of Visit Initial  Care provided to: Patient  Referral source Nurse (RN/NT/LPN)  Reason for visit Routine spiritual support  Spiritual Framework  Presenting Themes Coping tools;Impactful experiences and emotions  Community/Connection Family;Significant other;Friend(s)  Patient Stress Factors Health changes;Financial concerns;Major life changes  Family Stress Factors Financial concerns  Interventions  Spiritual Care Interventions Made Established relationship of care and support;Compassionate presence;Reflective listening;Normalization of emotions;Prayer  Intervention Outcomes  Outcomes Reduced anxiety;Reduced isolation;Connection to spiritual care

## 2022-07-22 NOTE — Consult Note (Signed)
Renal Service Consult Note [facility]  Alder Murri 07/22/2022 Maree Krabbe, MD Requesting Physician: Dr. Marland Mcalpine   Reason for Consult: Renal failure HPI: The patient is a 62 y.o. year-old w/ PMH as below who presented to ED on 4/06 w/ s/o weats, cough, fever, HA and back pain for 3 days. Unsteady gait. +nausea. Temp of 105 F at home w/ rigors. In ED temp 98, BP 170/ 120, 87% on RA. Pt was rx'd w/ duoneb, IV abx for PNA and 2 L bolus. WBC 17k Hb 15 creat was 1.41, alb 3.0. COVID neg. CXR low lung volume, no acute changes. Pt was admitted w/ dx of AHRF and sepsis d/t PNA. Blood cx's were+ for MRSA (pt w/ hx of mrsa sepsis in dec 2023 assoc w/ toe infection). Imaging of spine was negative. Seen by ID, pt had prior ray amp and imaging was neg for osteo. 2 wks IV abx recommended. During this time pt's Na+ levels had dropped down to 121- 125 range, creat was stable around 1.1- 1.5.  He was given IV lasix  x 1 w/o much improvement. We are asked to see for hyponatremia.   Pt seen in room. Pt c/o DOE and endorses LE edema bilat. +hx of CHF, diastolic. ECHO here showed LVEF 60-65%.   ROS - denies CP, no joint pain, no HA, no blurry vision, no rash, no diarrhea, no nausea/ vomiting, no dysuria, no difficulty voiding   Past Medical History  Past Medical History:  Diagnosis Date   Cardiomyopathy    a. EF 45% in 2019.   CHF (congestive heart failure)    Chronic anticoagulation 09/30/2017   Diabetes mellitus type 2 in nonobese    Does not have health insurance    Essential hypertension 09/26/2017   Financial difficulties    H/O noncompliance with medical treatment, presenting hazards to health    Non-insulin treated type 2 diabetes mellitus 09/26/2017   Persistent atrial fibrillation    Sleep apnea suspected 09/30/2017   Past Surgical History  Past Surgical History:  Procedure Laterality Date   ABDOMINAL AORTOGRAM W/LOWER EXTREMITY N/A 09/02/2021   Procedure: ABDOMINAL  AORTOGRAM W/LOWER EXTREMITY;  Surgeon: Nada Libman, MD;  Location: [facility]IVE [facility];  Service: Cardiovascular;  Laterality: N/A;   ABDOMINAL AORTOGRAM W/LOWER EXTREMITY N/A 12/28/2021   Procedure: ABDOMINAL AORTOGRAM W/LOWER EXTREMITY;  Surgeon: Nada Libman, MD;  Location: [facility];  Service: Cardiovascular;  Laterality: N/A;   AMPUTATION Right 09/04/2021   Procedure: 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;  Surgeon: Nadara Mustard, MD;  Location: [facility] OR;  Service: Orthopedics;  Laterality: Right;   APPENDECTOMY  1971   CARDIOVERSION N/A 09/28/2017   Procedure: CARDIOVERSION;  Surgeon: Thurmon Fair, MD;  Location: [facility] ENDOSCOPY;  Service: Cardiovascular;  Laterality: N/A;   PERIPHERAL VASCULAR BALLOON ANGIOPLASTY  09/02/2021   Procedure: PERIPHERAL VASCULAR BALLOON ANGIOPLASTY;  Surgeon: Nada Libman, MD;  Location: [facility];  Service: Cardiovascular;;   PERIPHERAL VASCULAR BALLOON ANGIOPLASTY  12/28/2021   Procedure: PERIPHERAL VASCULAR BALLOON ANGIOPLASTY;  Surgeon: Nada Libman, MD;  Location: [facility];  Service: Cardiovascular;;  Posterior Tibial PTA only   TEE WITHOUT CARDIOVERSION N/A 09/28/2017   Procedure: TRANSESOPHAGEAL ECHOCARDIOGRAM (TEE);  Surgeon: Thurmon Fair, MD;  Location: [facility] ENDOSCOPY;  Service: Cardiovascular;  Laterality: N/A;   TEE WITHOUT CARDIOVERSION N/A 09/03/2021   Procedure: TRANSESOPHAGEAL ECHOCARDIOGRAM (TEE);  Surgeon: Chilton Si, MD;  Location: [facility] ENDOSCOPY;  Service: Cardiovascular;  Laterality: N/A;   TEE WITHOUT CARDIOVERSION N/A 07/15/2022   Procedure: TRANSESOPHAGEAL ECHOCARDIOGRAM;  Surgeon: Thurmon Fair, MD;  Location: [facility];  Service: Cardiovascular;  Laterality: N/A;   Family History  Family History  Problem Relation Age of Onset   Hypertension Mother    Social History  reports that he quit smoking about 4 years ago. His smoking use included cigarettes. He has never used smokeless tobacco. He reports  that he does not currently use alcohol after a past usage of about 3.0 standard drinks of alcohol per week. He reports that he does not use drugs. Allergies No Known Allergies Home medications Prior to Admission medications   Medication Sig Start Date End Date Taking? Authorizing Provider  apixaban (ELIQUIS) 5 MG TABS tablet Take 1 tablet (5 mg total) by mouth 2 (two) times daily. 05/10/21  Yes Tereso Newcomer T, PA-C  aspirin EC 81 MG tablet Take 1 tablet (81 mg total) by mouth daily. Swallow whole. 09/07/21  Yes Burnadette Pop, MD  atorvastatin (LIPITOR) 80 MG tablet Take 1 tablet (80 mg total) by mouth daily. 05/17/22  Yes [name], MD  busPIRone (BUSPAR) 10 MG tablet Take 1 tablet (10 mg total) by mouth 2 (two) times daily. Patient taking differently: Take 10 mg by mouth daily as needed (For anxiety). 12/16/21  Yes Georgian Co M, PA-C  carvedilol (COREG) 6.25 MG tablet Take 1 tablet (6.25 mg total) by mouth 2 (two) times daily. 07/01/22  Yes Sharlene Dory, PA-C  clopidogrel (PLAVIX) 75 MG tablet Take 1 tablet (75 mg total) by mouth daily with breakfast. 04/13/22  Yes Newlin, Enobong, MD  digoxin (LANOXIN) 0.125 MG tablet Take 1 tablet (125 mcg total) by mouth once daily. 06/03/22  Yes Wendall Stade, MD  famotidine (PEPCID) 20 MG tablet Take 1 tablet (20 mg total) by mouth 2 times daily. 03/24/22  Yes   fluticasone (FLONASE) 50 MCG/ACT nasal spray Place 2 sprays into both nostrils daily. 02/01/22  Yes [name], MD  furosemide (LASIX) 40 MG tablet Take 1 tablet (40 mg total) by mouth daily. 05/17/22  Yes [name], MD  Insulin Glargine (BASAGLAR KWIKPEN) 100 UNIT/ML Inject 40 Units into the skin daily. 04/13/22  Yes Newlin, Enobong, MD  insulin lispro (HUMALOG KWIKPEN) 100 UNIT/ML KwikPen Inject 15 Units into the skin 3 (three) times daily. 04/13/22  Yes Newlin, Odette Horns, MD  sacubitril-valsartan (ENTRESTO) 49-51 MG Take 1 tablet by mouth 2 (two) times daily. Needs to see cardiology  for refills Patient taking differently: Take 1 tablet by mouth 2 (two) times daily. 01/04/22  Yes Claiborne Rigg, NP  spironolactone (ALDACTONE) 25 MG tablet Take 1 tablet (25 mg total) by mouth daily. 12/16/21 07/20/22 Yes McClung, Marzella Schlein, PA-C  HYDROcodone-acetaminophen (NORCO/VICODIN) 5-325 MG tablet Take 1 tablet by mouth every 4 (four) hours as needed for up to 7 days for Moderate pain (4-6) or Severe pain (7-10). Patient not taking: Reported on 07/12/2022 03/24/22        Vitals:   07/21/22 1955 07/21/22 2055 07/22/22 0438 07/22/22 0500  BP: 136/77  106/76   Pulse: 85  77   Resp: 18  18   Temp: 99.2 F (37.3 C)  99.7 F (37.6 C)   TempSrc: Oral  Oral   SpO2: 97% 96% 95%   Weight:    86 kg  Height:       Exam Gen alert, no distress No rash, cyanosis or gangrene Sclera anicteric, throat clear  +  jvd Chest rales bilat bases RRR no MRG Abd soft ntnd no mass or ascites +bs GU normal male MS no joint effusions or deformity Ext 1-2+ pretib edema bilat, no wounds or ulcers Neuro is alert, Ox 3 , nf     Date   Creat  eGFR  2019- 2020  0.99- 1.24  2021   0.97- 1.20  2022   1.32  Jan- June 2023 0.96- 1.35 > 60   Jul- dec 2023  1.40- 1.73 45- 50 ml/min   4/06   1.38, admit   4/10   1.32  > 60 ml/min  4/15   1.17  4/17   1.42  57 ml/min  4/18   1.46  55 ml/min     Total I/o here = 10 L in and 21 L out = net neg 11 L   Wt's 4/09 - 81kg      cXR 4/13 - IMPRESSION: Ill-defined opacity at the RIGHT lung base, atelectasis versus pneumonia. Ground-glass opacities within the LEFT upper lung, suspicious for multifocal pneumonia.    CXR 4/17 - IMPRESSION: Increasing right basilar infiltrate with associated effusion. (My read = +vasc congestion)    UNa < 10, UOsm 298   UCreat 108   Assessment/ Plan: Hyponatremia - hypervolemic hyponatremia, suspected decomp diast CHF. Giving tolvaptan and IV lasix 20 mg bid. Follow Na+ level bid. Will follow.  AKI on CKD 3a - b/l creat 1.00-  1.40 from 2023, 45- >60 ml/min. Creat here in same range in setting of MRSA bacteremia w/ pna and/or diast CHF exacerbation.  MRSA bacteremia - on 2 wk course of IV abx DM2 - on insulin, sig complications H/o osteomyelitis R foot - sp ray amp prior admission HTN - per pmd Atrial fib      Vinson Moselle  MD [facility] 07/21/2022, 5:05 PM  Recent Labs  Lab 07/21/22 0413 07/22/22 0306  HGB 10.0* 10.0*  ALBUMIN 2.1* 2.1*  CALCIUM 8.1* 8.3*  PHOS 3.3 3.0  CREATININE 1.46* 1.23  K 4.0 4.0   Inpatient medications:  apixaban  5 mg Oral BID   atorvastatin  80 mg Oral Daily   budesonide (PULMICORT) nebulizer solution  0.5 mg Nebulization BID   busPIRone  10 mg Oral BID   carvedilol  3.125 mg Oral BID   Chlorhexidine Gluconate Cloth  6 each Topical Daily   clopidogrel  75 mg Oral Q breakfast   digoxin  125 mcg Oral Daily   famotidine  20 mg Oral BID   furosemide  20 mg Intravenous Q12H   insulin aspart  0-20 Units Subcutaneous TID WC   insulin glargine-yfgn  20 Units Subcutaneous Daily   lidocaine  1 patch Transdermal Daily   nystatin  5 mL Oral QID   polyethylene glycol  17 g Oral BID   senna-docusate  2 tablet Oral BID   sodium chloride flush  10-40 mL Intracatheter Q12H    acetaminophen **OR** acetaminophen, Glycerin (Adult), HYDROmorphone (DILAUDID) injection, hydrOXYzine, ipratropium-albuterol, methocarbamol, Muscle Rub, ondansetron **OR** ondansetron (ZOFRAN) IV, mouth rinse, oxyCODONE, sodium chloride flush, sorbitol, milk of mag, mineral oil, glycerin (SMOG) enema

## 2022-07-22 NOTE — Progress Notes (Signed)
PROGRESS NOTE    David [name]  ZOX:[id number] DOB: 26-Jan-1961 DOA: 07/12/2022 PCP: [name], MD   Brief Narrative:  The patient is a 62 y.o. 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 His appetite has been decreased. He had a fever at home of 105 F. He was having significant chills and rigors.  His rigors were so intense that produced the above-mentioned back pain.  He was found to have recurrent MRSA bacteremia but given that no vegetations on his TEE ID recommended 2 weeks of total antibiotics but given his financial issues they have given him a dose of oritavancin now.  Sodium remains on the low side.   Nephrology was consulted and now they have started the patient on Lasix twice daily IV and also given 1 dose of tolvaptan 15 mg x 1 did not have much overt response to tolvaptan but sodium slowly came up and nephrology recommends continuing diuresis and current regimen today given his robust urine output.   Assessment and Plan:  Sepsis due to MRSA bacteremia -Blood cultures obtained on admission showing MRSA.   -He has a history of MRSA bacteremia in 2023 felt to be due to diabetic toe infections.   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 [name].  -Due to severe back pain he underwent imaging, MRI of the T and L-spine with nonspecific findings, no osteomyelitis or discitis.   -2D echo and TEE without vegetations.   -WBC Trend: Recent Labs  Lab 07/14/22 0525 07/15/22 0755 07/17/22 0300 07/18/22 0221 07/20/22 1010 07/21/22 0413 07/22/22 0306  WBC  22.6* 18.0* 15.9* 14.7* 14.1* 10.8* 10.4  -ID recommends total of 2 weeks of antibiotics.   -PICC line placed, and there were plans in place for him to get home health, however due to financial reasons this could not be arranged and patient received a dose of oritavancin on 4/15.   -PICC can be removed upon discharge   Right foot ulcer  -At the site of prior ray amputation.  Patient tells me that it is improving.   -Plain imaging without osteomyelitis, wound looks good without any evidence of deep infection -Already received Abx as above -Obtain WOC consult and he has palpable care recommendations and wound nurse recommending have the patient follow-up with outpatient wound care center and podiatric medicine at discharge   Severe Back pain  -Lumbar MRI with diffuse edema involving the lower posterior paraspinous musculature, nonspecific, acute myositis possible infectious versus inflammatory.   -MRI T-spine done and showed "No MRI evidence for acute infection within the thoracic spine. Small layering bilateral pleural effusions. Mild chronic compression deformities about the T2 through T6 as well as the T9 vertebral bodies. Underlying mild for age thoracic spondylosis without significant stenosis or neural impingement." -There is no loculated or drainable fluid collections.  Negative for discitis/osteomyelitis.   -Continue oxycodone, Flexeril made him loopy and he was switched to Robaxin: Also gets IV hydrocodone and increase the frequency given his continued back discomfort and pain -C/w bowel regimen  -May need to discuss with neurosurgery about back bracing if not improving    Hyponatremia  -Stable for most part but  with sudden worsening of 121 on 4/15, after a bolus of fluids for hypotension (possibly induced by pain medications).   -He also tells me he drinks large amounts of water several pitchers per day.   -He was placed on water restriction, sodium slightly better, continue to  monitor.   -Na+ Trend:  Recent Labs  Lab 07/17/22 0300 07/18/22 0221 07/18/22 2002 07/19/22 0423 07/20/22 0445 07/21/22 0413 07/22/22 0306  NA 126* 121* 121* 122* 123* 123* 125*  -His diuretics had been discontinued but he appeared volume overloaded so will give him IV Lasix 20 mg x1 this was done yesterday and nephrology was consulted formally and now he is getting IV Lasix 20 mg twice daily and also a dose of 15 mg of tolvaptan x 1 -Patient did not have much of a response to tolvaptan 50 mg yesterday so nephrology recommending holding off further dosing but recommended continue IV Lasix 20 mg twice daily to see if sodium comes up with diuresis -Baseline is around 128-130.  If he gets in the upper 120s could potentially go home  -Appreciate nephrology assistance and help   Essential Hypertension  -Continue his Sherryll Burger was held due to Hypotension, Coreg dose decreased. -Continue to Monitor BP per Protocol  -Last blood pressure reading was on the softer side at 117/80   CKD 3A Metabolic Acidosis -Baseline creatinine 1.2-1.5 -BUN/Cr Trend: Recent Labs  Lab 07/15/22 0755 07/17/22 0300 07/18/22 0221 07/19/22 0423 07/20/22 0445 07/21/22 0413 07/22/22 0306  BUN 30* 23* 21* 22* 25* 29* 24*  CREATININE 1.23 1.07 1.17 1.13 1.42* 1.46* 1.23  -CO2 was 21, anion gap was 9, chloride level was 93 -Avoid Nephrotoxic Medications, Contrast Dyes, Hypotension and Dehydration to Ensure Adequate Renal Perfusion and will need to Renally Adjust Meds -Continue to Monitor and Trend Renal Function carefully and repeat CMP in the AM    Hypomagnesemia -Patient's Mag Level Trend: Recent Labs  Lab 07/14/22 0525 07/17/22 0300 07/20/22 0445 07/21/22 0413 07/22/22 0306  MG 2.1 2.0 1.9 1.9 1.9  -Continue to Monitor and Replete as Necessary -Repeat Mag in the AM    Hyperbilirubinemia -Bilirubin Trend: Recent Labs  Lab 07/12/22 0850 07/13/22 0419 07/14/22 0525 07/17/22 0300 07/20/22 1010  07/21/22 0413 07/22/22 0306  BILITOT 1.4* 1.0 0.9 1.2 1.9* 1.3* 1.0  -Continue to Monitor and Trend and Repeat CMP in the AM   Atrial Fibrillation -Rate controlled, continue Eliquis, Coreg.   -Continue to Monitor on Telemetry  -Followed by cardiology as an outpatient   Chronic Combined CHF  -His EF has improved and now normalized.  Diuretics were discontinued due to his hyponatremia, continue fluid restriction, Entresto also held due to hypotension.   -He appeared volume overloaded yesterday so we will give him a dose of IV Lasix 20 mg x 1 and again appears volume overloaded today -Strict I's and O's and Daily Weights  Intake/Output Summary (Last 24 hours) at 07/22/2022 1723 Last data filed at 07/22/2022 0913 Gross per 24 hour  Intake 882 ml  Output 3825 ml  Net -2943 ml   -Nephrology was consulted given his hyponatremia and he is now getting diuresis with IV Lasix 20 g twice daily and will be continued given his adequate urine response -Will need cardiology follow-up closely charge   Hallucinations -Nursing staff states that the patient was hallucinating about bugs -Continue to monitor and placed on delirium precautions -May need head imaging if continues to persist but he seemed appropriate today   Normocytic Anemia -Hgb/Hct  Trend Recent Labs  Lab 07/14/22 0525 07/15/22 0755 07/17/22 0300 07/18/22 0221 07/20/22 1010 07/21/22 0413 07/22/22 0306  HGB 12.0* 13.5 12.4* 12.2* 11.4* 10.0* 10.0*  HCT 36.8* 40.6 36.9* 36.0* 33.1* 29.8* 30.0*  MCV 95.6 94.4 92.9 92.3 90.9 91.4 91.5  -Check Anemia Panel and is now showing an iron level of 16, UIBC 183, TIBC 199, saturation ratios of 8%, ferritin level of 393, folate level of 13.9, vitamin B12 of 1066 -Continue to monitor for signs and symptoms of bleeding; no overt bleeding noted -Repeat CBC in a.m.   Hyperlipidemia  -Continue Atorvastatin 80 mg po Daily   IDDM, poorly controlled, with hyperglycemia -Continue Insulin Glargine  20 units sq Daily -C/w Resistant Novolog SSI AC -Continue to Monitor CBGs carefully; CBG Trend: Recent Labs  Lab 07/21/22 0757 07/21/22 1122 07/21/22 1715 07/21/22 2053 07/22/22 0738 07/22/22 1149 07/22/22 1648  GLUCAP 177* 212* 170* 179* 199* 150* 225*    Hypoalbuminemia -Patient's Albumin Trend: Recent Labs  Lab 07/12/22 0850 07/13/22 0419 07/14/22 0525 07/17/22 0300 07/20/22 1010 07/21/22 0413 07/22/22 0306  ALBUMIN 3.0* 2.3* 2.5* 2.4* 2.3* 2.1* 2.1*  -Continue to Monitor and Trend and repeat CMP in the AM  DVT prophylaxis:  apixaban (ELIQUIS) tablet 5 mg    Code Status: Full Code Family Communication: No family at bedside  Disposition Plan:  Level of care: Med-Surg Status is: Inpatient Remains inpatient appropriate because: His further clinical improvement and clearance by the nephrology team   Consultants:  Nephrology  Procedures:  As delineated as above  Antimicrobials:  Anti-infectives (From admission, onward)    Start     Dose/Rate Route Frequency Ordered Stop   07/18/22 1500  Oritavancin Diphosphate (ORBACTIV) 1,200 mg in dextrose 5 % IVPB        1,200 mg 333.3 mL/hr over 180 Minutes Intravenous Once 07/18/22 1351 07/18/22 1822   07/16/22 0000  daptomycin (CUBICIN) IVPB  Status:  Discontinued        650 mg Intravenous Every 24 hours 07/16/22 1116 07/19/22    07/15/22 2000  DAPTOmycin (CUBICIN) 650 mg in sodium chloride 0.9 % IVPB  Status:  Discontinued        8 mg/kg  81 kg 126 mL/hr over 30 Minutes Intravenous Daily 07/15/22 1411 07/15/22 1413   07/15/22 2000  DAPTOmycin (CUBICIN) 650 mg in sodium chloride 0.9 % IVPB  Status:  Discontinued        8 mg/kg  81 kg 126 mL/hr over 30 Minutes Intravenous Daily 07/15/22 1413 07/18/22 1351   07/14/22 0200  vancomycin (VANCOREADY) IVPB 1750 mg/350 mL  Status:  Discontinued        1,750 mg 175 mL/hr over 120 Minutes Intravenous Every 24 hours 07/13/22 0718 07/15/22 1411   07/13/22 1000  cefTRIAXone  (ROCEPHIN) 1 g in sodium chloride 0.9 % 100 mL IVPB  Status:  Discontinued        1 g 200 mL/hr over 30 Minutes Intravenous Every 24 hours 07/12/22 1405 07/13/22 0239   07/13/22 1000  azithromycin (ZITHROMAX) 500 mg in sodium chloride 0.9 % 250 mL IVPB  Status:  Discontinued        500 mg 250 mL/hr over 60 Minutes Intravenous Every 24 hours 07/12/22 1405 07/13/22 0239   07/13/22 0330  vancomycin (VANCOREADY) IVPB 1750 mg/350 mL        1,750 mg 175 mL/hr over 120 Minutes Intravenous  Once 07/13/22 0240 07/13/22 0517   07/12/22 0845  cefTRIAXone (ROCEPHIN) 1 g in sodium  chloride 0.9 % 100 mL IVPB        1 g 200 mL/hr over 30 Minutes Intravenous  Once 07/12/22 0844 07/12/22 1218   07/12/22 0845  azithromycin (ZITHROMAX) 500 mg in sodium chloride 0.9 % 250 mL IVPB        500 mg 250 mL/hr over 60 Minutes Intravenous  Once 07/12/22 0844 07/12/22 1218       Subjective: Seen and examined at bedside and thinks he is breathing little bit better.  Continues to have lower extremity swelling and thinks it is only minimally improved.  Denies any chest pain.  Thinks his back pain is doing better with the increased regimen.  No other concerns or complaints at this time.  Objective: Vitals:   07/22/22 0438 07/22/22 0500 07/22/22 0825 07/22/22 1611  BP: 106/76  118/69 117/80  Pulse: 77  96 81  Resp: 18   18  Temp: 99.7 F (37.6 C)     TempSrc: Oral     SpO2: 95%  96% 98%  Weight:  86 kg    Height:        Intake/Output Summary (Last 24 hours) at 07/22/2022 1720 Last data filed at 07/22/2022 0913 Gross per 24 hour  Intake 882 ml  Output 3825 ml  Net -2943 ml   Filed Weights   07/12/22 0824 07/12/22 1219 07/22/22 0500  Weight: 81 kg 81 kg 86 kg   Examination: Physical Exam:  Constitutional: WN/WD Caucasian male in no acute distress appears a bit more comfortable today Respiratory: Diminished to auscultation bilaterally with coarse breath sounds and has some crackles.  no wheezing, rales,  rhonchi. Normal respiratory effort and patient is not tachypenic. No accessory muscle use.  Cardiovascular: RRR, no murmurs / rubs / gallops. S1 and S2 auscultated.  Has 1+ lower extremity edema bilaterally Abdomen: Soft, non-tender, distended secondary to body habitus. Bowel sounds positive.  GU: Deferred. Musculoskeletal: No clubbing / cyanosis of digits/nails.  Has a fourth ray amputation of right foot Skin: No rashes, lesions, ulcers on limited skin evaluation but has multiple tattoos noted and a ulcer on his foot. No induration; Warm and dry.  Neurologic: CN 2-12 grossly intact with no focal deficits. Romberg sign and cerebellar reflexes not assessed.  Psychiatric: Normal judgment and insight. Alert and oriented x 3. Normal but a little anxious mood and appropriate affect.   Data Reviewed: I have personally reviewed following labs and imaging studies  CBC: Recent Labs  Lab 07/17/22 0300 07/18/22 0221 07/20/22 1010 07/21/22 0413 07/22/22 0306  WBC 15.9* 14.7* 14.1* 10.8* 10.4  NEUTROABS  --   --  10.8* 7.9* 7.9*  HGB 12.4* 12.2* 11.4* 10.0* 10.0*  HCT 36.9* 36.0* 33.1* 29.8* 30.0*  MCV 92.9 92.3 90.9 91.4 91.5  PLT 321 353 364 335 328   Basic Metabolic Panel: Recent Labs  Lab 07/17/22 0300 07/18/22 0221 07/18/22 2002 07/19/22 0423 07/20/22 0445 07/20/22 1010 07/21/22 0413 07/22/22 0306  NA 126* 121* 121* 122* 123*  --  123* 125*  K 4.3 4.1  --  4.4 4.7  --  4.0 4.0  CL 96* 93*  --  94* 93*  --  94* 98  CO2 23 21*  --  22 21*  --  22 20*  GLUCOSE 241* 172*  --  203* 127*  --  243* 174*  BUN 23* 21*  --  22* 25*  --  29* 24*  CREATININE 1.07 1.17  --  1.13 1.42*  --  1.46* 1.23  CALCIUM 8.8* 8.7*  --  8.3* 8.7*  --  8.1* 8.3*  MG 2.0  --   --   --  1.9  --  1.9 1.9  PHOS  --   --   --   --   --  3.5 3.3 3.0   GFR: Estimated Creatinine Clearance: 70.1 mL/min (by C-G formula based on SCr of 1.23 mg/dL). Liver Function Tests: Recent Labs  Lab 07/17/22 0300  07/20/22 1010 07/21/22 0413 07/22/22 0306  AST 20 22 22 25   ALT 28 29 27 29   ALKPHOS 115 114 114 130*  BILITOT 1.2 1.9* 1.3* 1.0  PROT 6.7 6.7 6.1* 6.5  ALBUMIN 2.4* 2.3* 2.1* 2.1*   No results for input(s): "LIPASE", "AMYLASE" in the last 168 hours. No results for input(s): "AMMONIA" in the last 168 hours. Coagulation Profile: No results for input(s): "INR", "PROTIME" in the last 168 hours. Cardiac Enzymes: Recent Labs  Lab 07/16/22 0552  CKTOTAL 20*   BNP (last 3 results) No results for input(s): "PROBNP" in the last 8760 hours. HbA1C: No results for input(s): "HGBA1C" in the last 72 hours. CBG: Recent Labs  Lab 07/21/22 1715 07/21/22 2053 07/22/22 0738 07/22/22 1149 07/22/22 1648  GLUCAP 170* 179* 199* 150* 225*   Lipid Profile: No results for input(s): "CHOL", "HDL", "LDLCALC", "TRIG", "CHOLHDL", "LDLDIRECT" in the last 72 hours. Thyroid Function Tests: No results for input(s): "TSH", "T4TOTAL", "FREET4", "T3FREE", "THYROIDAB" in the last 72 hours. Anemia Panel: Recent Labs    07/22/22 0306  VITAMINB12 1,066*  FOLATE 13.9  FERRITIN 393*  TIBC 199*  IRON 16*  RETICCTPCT 1.3   Sepsis Labs: No results for input(s): "PROCALCITON", "LATICACIDVEN" in the last 168 hours.  Recent Results (from the past 240 hour(s))  Minimum Inhibitory Conc. (1 Drug)     Status: None   Collection Time: 07/13/22  2:47 PM  Result Value Ref Range Status   Min Inhibitory Conc (1 Drug) Final report  Corrected    Comment: (NOTE) Performed At: [facility] [address] [place], [place] [id number] Jolene Schimke MD WU:[id number] CORRECTED ON 04/16 AT 1236: PREVIOUSLY REPORTED AS Preliminary report    Source CRE SAUR BLOOD  Final    Comment: Performed at [facility], [address] [place], [place] [address]  Culture, blood (Routine X 2) w Reflex to ID Panel     Status: None   Collection Time: 07/14/22  5:25 AM   Specimen: BLOOD  Result Value Ref  Range Status   Specimen Description   Final    BLOOD LEFT ANTECUBITAL Performed at [facility], [address] [place], [place] [address]    Special Requests   Final    BOTTLES DRAWN AEROBIC AND ANAEROBIC Blood Culture adequate volume Performed at [facility], [address] [place], [place] [address]    Culture   Final    NO GROWTH 5 DAYS Performed at [facility], [address], [place], [place] [address]    Report Status 07/19/2022 FINAL  Final  Culture, blood (Routine X 2) w Reflex to ID Panel     Status: None   Collection Time: 07/14/22  5:25 AM   Specimen: BLOOD  Result Value Ref Range Status   Specimen Description   Final    BLOOD BLOOD LEFT HAND Performed at [facility], [address], [place], [place] [address]    Special Requests   Final  BOTTLES DRAWN AEROBIC AND ANAEROBIC Blood Culture adequate volume Performed at [facility], [address], [place], [place] [address]    Culture   Final    NO GROWTH 5 DAYS Performed at [facility], [address], [place], [place] [address]    Report Status 07/19/2022 FINAL  Final     Radiology Studies: No results found.  Scheduled Meds:  apixaban  5 mg Oral BID   atorvastatin  80 mg Oral Daily   budesonide (PULMICORT) nebulizer solution  0.5 mg Nebulization BID   busPIRone  10 mg Oral BID   carvedilol  3.125 mg Oral BID   Chlorhexidine Gluconate Cloth  6 each Topical Daily   clopidogrel  75 mg Oral Q breakfast   digoxin  125 mcg Oral Daily   famotidine  20 mg Oral BID   furosemide  20 mg Intravenous Q12H   insulin aspart  0-20 Units Subcutaneous TID WC   insulin glargine-yfgn  20 Units Subcutaneous Daily   lidocaine  1 patch Transdermal Daily   nystatin  5 mL Oral QID   polyethylene glycol  17 g Oral BID   senna-docusate  2 tablet Oral BID   sodium chloride flush  10-40 mL Intracatheter Q12H   Continuous  Infusions:   LOS: 10 days   Marguerita Merles, DO [facility] Available via Epic secure chat 7am-7pm After these hours, please refer to coverage provider listed on [email or web address] 07/22/2022, 5:20 PM

## 2022-07-23 LAB — MAGNESIUM: Magnesium: 2.1 mg/dL (ref 1.7–2.4)

## 2022-07-23 LAB — CBC WITH DIFFERENTIAL/PLATELET
Abs Immature Granulocytes: 0.06 10*3/uL (ref 0.00–0.07)
Basophils Absolute: 0.1 10*3/uL (ref 0.0–0.1)
Basophils Relative: 1 %
Eosinophils Absolute: 0.1 10*3/uL (ref 0.0–0.5)
Eosinophils Relative: 1 %
HCT: 30.1 % — ABNORMAL LOW (ref 39.0–52.0)
Hemoglobin: 10.1 g/dL — ABNORMAL LOW (ref 13.0–17.0)
Immature Granulocytes: 1 %
Lymphocytes Relative: 13 %
Lymphs Abs: 1.1 10*3/uL (ref 0.7–4.0)
MCH: 30.9 pg (ref 26.0–34.0)
MCHC: 33.6 g/dL (ref 30.0–36.0)
MCV: 92 fL (ref 80.0–100.0)
Monocytes Absolute: 1.2 10*3/uL — ABNORMAL HIGH (ref 0.1–1.0)
Monocytes Relative: 13 %
Neutro Abs: 6.4 10*3/uL (ref 1.7–7.7)
Neutrophils Relative %: 71 %
Platelets: 366 10*3/uL (ref 150–400)
RBC: 3.27 MIL/uL — ABNORMAL LOW (ref 4.22–5.81)
RDW: 13.9 % (ref 11.5–15.5)
WBC: 9 10*3/uL (ref 4.0–10.5)
nRBC: 0 % (ref 0.0–0.2)

## 2022-07-23 LAB — COMPREHENSIVE METABOLIC PANEL
ALT: 31 U/L (ref 0–44)
AST: 26 U/L (ref 15–41)
Albumin: 2.2 g/dL — ABNORMAL LOW (ref 3.5–5.0)
Alkaline Phosphatase: 139 U/L — ABNORMAL HIGH (ref 38–126)
Anion gap: 7 (ref 5–15)
BUN: 21 mg/dL — ABNORMAL HIGH (ref 6–20)
CO2: 24 mmol/L (ref 22–32)
Calcium: 8.5 mg/dL — ABNORMAL LOW (ref 8.9–10.3)
Chloride: 99 mmol/L (ref 98–111)
Creatinine, Ser: 1.21 mg/dL (ref 0.61–1.24)
GFR, Estimated: >60 mL/min (ref >60)
Glucose, Bld: 206 mg/dL — ABNORMAL HIGH (ref 70–99)
Potassium: 3.9 mmol/L (ref 3.5–5.1)
Sodium: 130 mmol/L — ABNORMAL LOW (ref 135–145)
Total Bilirubin: 0.7 mg/dL (ref 0.3–1.2)
Total Protein: 6.9 g/dL (ref 6.5–8.1)

## 2022-07-23 LAB — GLUCOSE, CAPILLARY
Glucose-Capillary: 256 mg/dL — ABNORMAL HIGH (ref 70–99)
Glucose-Capillary: 77 mg/dL (ref 70–99)
Glucose-Capillary: 162 mg/dL — ABNORMAL HIGH (ref 70–99)
Glucose-Capillary: 236 mg/dL — ABNORMAL HIGH (ref 70–99)
Glucose-Capillary: 213 mg/dL — ABNORMAL HIGH (ref 70–99)

## 2022-07-23 LAB — PHOSPHORUS: Phosphorus: 3.6 mg/dL (ref 2.5–4.6)

## 2022-07-23 NOTE — Progress Notes (Signed)
[facility] Progress Note  Subjective: seen in room, continues to diurese well. SOB continues to improve and LE edema.  Vitals:   07/22/22 2006 07/23/22 0530 07/23/22 0700 07/23/22 0855  BP: 108/76 116/65    Pulse: 88 88  77  Resp: Temp: 99 F (37.2 C) 98.8 F (37.1 C)    TempSrc: Oral Oral    SpO2: 95% 95%  92%  Weight:   84.4 kg   Height:        Exam: Gen alert, no distress No rash, cyanosis or gangrene Sclera anicteric, throat clear  +jvd Chest improving mild bibasilar rales RRR no MRG Abd soft ntnd no mass or ascites +bs GU normal male MS no joint effusions or deformity Ext 1+ pretib edema bilat, much better  Neuro is alert, Ox 3 , nf      Date                            Creat               eGFR  2019- 2020                 0.99- 1.24  2021                           0.97- 1.20  2022                           1.32  Jan- June 2023          0.96- 1.35        > 60   Jul- dec 2023             1.40- 1.73        45- 50 ml/min    4/06                            1.38, admit        4/10                            1.32                 > 60 ml/min  4/15                            1.17  4/17                            1.42                 57 ml/min  4/18                            1.46                 55 ml/min       Total I/o here = 10 L in and 21 L out = net neg 11 L   Wt's 4/09 - 81kg      CXR 4/13 - IMPRESSION: Ill-defined opacity at the RIGHT lung base, atelectasis versus pneumonia. Ground-glass opacities within the LEFT upper lung, suspicious for multifocal  pneumonia.    CXR 4/17 - IMPRESSION: Increasing right basilar infiltrate with associated effusion. (My read = +vasc congestion)    UNa < 10, UOsm 298   UCreat 108    Assessment/ Plan: Hyponatremia - hypervolemic hyponatremia, suspected decomp diast CHF. UNa is low c/w CHF exacerbation. Not much of a response to tolvaptan  x 1.  Na+ improved however w/ continued diuresis, up to 130  today. Will cont IV lasix 20 mg bid.  AKI on CKD 3a - b/l creat 1.00- 1.40 from 2023, 45- >60 ml/min. Creat here in his normal range. Wt's and edema are improving. Will give about 24hrs or so more of the IV diuretics.  Vol overload - sig improvement, cont IV lasix 20 mg bid as above MRSA bacteremia - IV abx completed per pmd/ ID [id number] - on insulin, sig complications H/o osteomyelitis R foot - sp ray amp prior admission in Dec 2023 HTN - per pmd Atrial fib   Vinson Moselle MD [facility] 07/23/2022, 3:32 PM  Recent Labs  Lab 07/22/22 0306 07/23/22 0240  HGB 10.0* 10.1*  ALBUMIN 2.1* 2.2*  CALCIUM 8.3* 8.5*  PHOS 3.0 3.6  CREATININE 1.23 1.21  K 4.0 3.9    Recent Labs  Lab 07/22/22 0306  IRON 16*  TIBC 199*  FERRITIN 393*    Inpatient medications:  apixaban  5 mg Oral BID   atorvastatin  80 mg Oral Daily   budesonide (PULMICORT) nebulizer solution  0.5 mg Nebulization BID   busPIRone  10 mg Oral BID   carvedilol  3.125 mg Oral BID   Chlorhexidine Gluconate Cloth  6 each Topical Daily   clopidogrel  75 mg Oral Q breakfast   digoxin  125 mcg Oral Daily   famotidine  20 mg Oral BID   furosemide  20 mg Intravenous Q12H   insulin aspart  0-20 Units Subcutaneous TID WC   insulin glargine-yfgn  20 Units Subcutaneous Daily   lidocaine  1 patch Transdermal Daily   nystatin  5 mL Oral QID   polyethylene glycol  17 g Oral BID   senna-docusate  2 tablet Oral BID   sodium chloride flush  10-40 mL Intracatheter Q12H    acetaminophen **OR** acetaminophen, Glycerin (Adult), HYDROmorphone (DILAUDID) injection, hydrOXYzine, ipratropium-albuterol, magic mouthwash w/lidocaine, methocarbamol, Muscle Rub, ondansetron **OR** ondansetron (ZOFRAN) IV, mouth rinse, oxyCODONE, sodium chloride flush, sorbitol, milk of mag, mineral oil, glycerin (SMOG) enema

## 2022-07-23 NOTE — Progress Notes (Signed)
PROGRESS NOTE    David [name]  WUJ:[id number] DOB: [birth year] DOA: 07/12/2022 PCP: [name], MD   Brief Narrative:  The patient is a 62 y.o. 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 His appetite has been decreased. He had a fever at home of 105 F. He was having significant chills and rigors.  His rigors were so intense that produced the above-mentioned back pain.  He was found to have recurrent MRSA bacteremia but given that no vegetations on his TEE ID recommended 2 weeks of total antibiotics but given his financial issues they have given him a dose of oritavancin now.  Sodium remains on the low side.   Nephrology was consulted and now they have started the patient on Lasix twice daily IV and also given 1 dose of tolvaptan 15 mg x 1 did not have much overt response to tolvaptan but sodium slowly came up and nephrology recommends continuing diuresis and current regimen today given his robust urine output.  **Interim History Patient continues to improve and shortness of breath as well as leg edema improving.  Sodium is improved to 130 today.  Nephrology recommending continue diuretics to IV for another 24 hours or more.  Assessment and Plan:  Sepsis due to MRSA bacteremia -Blood cultures obtained on admission showing MRSA.   -He has a history of MRSA bacteremia in 2023 felt to be due to diabetic toe infections.   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 [name].  -Due to severe back pain he underwent imaging, MRI of the T and L-spine with nonspecific  findings, no osteomyelitis or discitis.   -2D echo and TEE without vegetations.   -WBC Trend: Recent Labs  Lab 07/15/22 0755 07/17/22 0300 07/18/22 0221 07/20/22 1010 07/21/22 0413 07/22/22 0306 07/23/22 0240  WBC 18.0* 15.9* 14.7* 14.1* 10.8* 10.4 9.0  -ID recommends total of 2 weeks of antibiotics.   -PICC line placed, and there were plans in place for him to get home health, however due to financial reasons this could not be arranged and patient received a dose of oritavancin on 4/15.   -PICC can be removed upon discharge   Right foot ulcer  -At the site of prior ray amputation.  Patient tells me that it is improving.   -Plain imaging without osteomyelitis, wound looks good without any evidence of deep infection -Already received Abx as above -Obtain WOC consult and he has palpable care recommendations and wound nurse recommending have the patient follow-up with outpatient wound care center and podiatric medicine at discharge   Severe Back pain  -Lumbar MRI with diffuse edema involving the lower posterior paraspinous musculature, nonspecific, acute myositis possible infectious versus inflammatory.   -MRI T-spine done and showed "No MRI evidence for acute infection within the thoracic spine. Small layering bilateral pleural effusions. Mild chronic compression deformities about the T2 through T6 as well as the T9 vertebral bodies. Underlying mild for age thoracic spondylosis without significant stenosis or neural impingement." -There is no loculated or drainable fluid collections.  Negative for discitis/osteomyelitis.   -Continue oxycodone, Flexeril made him loopy and he was switched to Robaxin: Also gets IV hydrocodone and  increase the frequency given his continued back discomfort and pain -C/w bowel regimen  -May need to discuss with neurosurgery about back bracing if not improving but his Back Pain is relatively stable on current regimen    Hyponatremia  -Stable for most part but  with sudden worsening of 121 on 4/15, after a bolus of fluids for hypotension (possibly induced by pain medications).   -He also tells me he drinks large amounts of water several pitchers per day.   -He was placed on water restriction, sodium slightly better, continue to monitor.   -Na+ Trend:  Recent Labs  Lab 07/18/22 0221 07/18/22 2002 07/19/22 0423 07/20/22 0445 07/21/22 0413 07/22/22 0306 07/23/22 0240  NA 121* 121* 122* 123* 123* 125* 130*  -Patient did not have much of a response to tolvaptan 15 mg yesterday so nephrology recommending holding off further dosing but recommended continue IV Lasix 20 mg twice daily to see if sodium comes up with diuresis and recommending for at least 24 hours or more -Baseline is around 128-130.  If he gets in the upper 120s could potentially go home but he continues to be volume overloaded continue diuresis for now -Appreciate nephrology assistance and help   Essential Hypertension  -Continue his Sherryll Burger was held due to Hypotension, Coreg dose decreased. -Continue to Monitor BP per Protocol  -Last blood pressure reading was on the softer side at 116/65   CKD 3A Metabolic Acidosis -Baseline creatinine 1.2-1.5 -BUN/Cr Trend: Recent Labs  Lab 07/17/22 0300 07/18/22 0221 07/19/22 0423 07/20/22 0445 07/21/22 0413 07/22/22 0306 07/23/22 0240  BUN 23* 21* 22* 25* 29* 24* 21*  CREATININE 1.07 1.17 1.13 1.42* 1.46* 1.23 1.21  -CO2 was 21, anion gap was 9, chloride level was 93 -Avoid Nephrotoxic Medications, Contrast Dyes, Hypotension and Dehydration to Ensure Adequate Renal Perfusion and will need to Renally Adjust Meds -Continue to Monitor and Trend Renal Function carefully and repeat CMP in the AM    Hypomagnesemia -Patient's Mag Level Trend: Recent Labs  Lab 07/14/22 0525 07/17/22 0300 07/20/22 0445 07/21/22 0413 07/22/22 0306 07/23/22 0240  MG 2.1 2.0 1.9 1.9 1.9 2.1  -Continue to Monitor and Replete as Necessary -Repeat Mag in  the AM    Hyperbilirubinemia -Bilirubin Trend: Recent Labs  Lab 07/13/22 0419 07/14/22 0525 07/17/22 0300 07/20/22 1010 07/21/22 0413 07/22/22 0306 07/23/22 0240  BILITOT 1.0 0.9 1.2 1.9* 1.3* 1.0 0.7  -Continue to Monitor and Trend and Repeat CMP in the AM   Atrial Fibrillation -Rate controlled, continue Eliquis, Coreg.   -Continue to Monitor on Telemetry  -Followed by cardiology as an outpatient   Chronic Combined CHF  -His EF has improved and now normalized.  Diuretics were discontinued due to his hyponatremia, continue fluid restriction, Entresto also held due to hypotension.   -He appeared volume overloaded yesterday so we will give him a dose of IV Lasix 20 mg x 1 and again appears volume overloaded today -Strict I's and O's and Daily Weights  Intake/Output Summary (Last 24 hours) at 07/23/2022 1754 Last data filed at 07/23/2022 0802 Gross per 24 hour  Intake 240 ml  Output 2450 ml  Net -2210 ml   -Nephrology was consulted given his hyponatremia and he is now getting diuresis with IV Lasix 20 g twice daily and will be continued given his adequate urine response -Will need cardiology follow-up closely charge   Hallucinations, improving  -Nursing staff states that the patient was hallucinating about bugs -Continue to monitor and placed on  delirium precautions -May need head imaging if continues to persist but he seemed appropriate today   Normocytic Anemia -Hgb/Hct Trend Recent Labs  Lab 07/15/22 0755 07/17/22 0300 07/18/22 0221 07/20/22 1010 07/21/22 0413 07/22/22 0306 07/23/22 0240  HGB 13.5 12.4* 12.2* 11.4* 10.0* 10.0* 10.1*  HCT 40.6 36.9* 36.0* 33.1* 29.8* 30.0* 30.1*  MCV 94.4 92.9 92.3 90.9 91.4 91.5 92.0  -Check Anemia Panel and is now showing an iron level of 16, UIBC 183, TIBC 199, saturation ratios of 8%, ferritin level of 393, folate level of 13.9, vitamin B12 of 1066 -Continue to monitor for signs and symptoms of bleeding; no overt bleeding  noted -Repeat CBC in a.m.   Hyperlipidemia  -Continue Atorvastatin 80 mg po Daily   IDDM, poorly controlled, with hyperglycemia -Continue Insulin Glargine 20 units sq Daily -C/w Resistant Novolog SSI AC -Continue to Monitor CBGs carefully; CBG Trend: Recent Labs  Lab 07/22/22 1149 07/22/22 1648 07/22/22 2139 07/23/22 0746 07/23/22 1236 07/23/22 1531 07/23/22 1711  GLUCAP 150* 225* 206* 256* 77 162* 236*    Hypoalbuminemia -Patient's Albumin Trend: Recent Labs  Lab 07/13/22 0419 07/14/22 0525 07/17/22 0300 07/20/22 1010 07/21/22 0413 07/22/22 0306 07/23/22 0240  ALBUMIN 2.3* 2.5* 2.4* 2.3* 2.1* 2.1* 2.2*  -Continue to Monitor and Trend and repeat CMP in the AM  DVT prophylaxis:  apixaban (ELIQUIS) tablet 5 mg    Code Status: Full Code Family Communication: No family currently at bedside  Disposition Plan:  Level of care: Med-Surg Status is: Inpatient Remains inpatient appropriate because: Clinical improvement and clearance by the nephrology given that he is being diuresed   Consultants:  Nephrology  Procedures:  As delineated as above  Antimicrobials:  Anti-infectives (From admission, onward)    Start     Dose/Rate Route Frequency Ordered Stop   07/18/22 1500  Oritavancin Diphosphate (ORBACTIV) 1,200 mg in dextrose 5 % IVPB        1,200 mg 333.3 mL/hr over 180 Minutes Intravenous Once 07/18/22 1351 07/18/22 1822   07/16/22 0000  daptomycin (CUBICIN) IVPB  Status:  Discontinued        650 mg Intravenous Every 24 hours 07/16/22 1116 07/19/22    07/15/22 2000  DAPTOmycin (CUBICIN) 650 mg in sodium chloride 0.9 % IVPB  Status:  Discontinued        8 mg/kg  81 kg 126 mL/hr over 30 Minutes Intravenous Daily 07/15/22 1411 07/15/22 1413   07/15/22 2000  DAPTOmycin (CUBICIN) 650 mg in sodium chloride 0.9 % IVPB  Status:  Discontinued        8 mg/kg  81 kg 126 mL/hr over 30 Minutes Intravenous Daily 07/15/22 1413 07/18/22 1351   07/14/22 0200  vancomycin  (VANCOREADY) IVPB 1750 mg/350 mL  Status:  Discontinued        1,750 mg 175 mL/hr over 120 Minutes Intravenous Every 24 hours 07/13/22 0718 07/15/22 1411   07/13/22 1000  cefTRIAXone (ROCEPHIN) 1 g in sodium chloride 0.9 % 100 mL IVPB  Status:  Discontinued        1 g 200 mL/hr over 30 Minutes Intravenous Every 24 hours 07/12/22 1405 07/13/22 0239   07/13/22 1000  azithromycin (ZITHROMAX) 500 mg in sodium chloride 0.9 % 250 mL IVPB  Status:  Discontinued        500 mg 250 mL/hr over 60 Minutes Intravenous Every 24 hours 07/12/22 1405 07/13/22 0239   07/13/22 0330  vancomycin (VANCOREADY) IVPB 1750 mg/350 mL        1,750  mg 175 mL/hr over 120 Minutes Intravenous  Once 07/13/22 0240 07/13/22 0517   07/12/22 0845  cefTRIAXone (ROCEPHIN) 1 g in sodium chloride 0.9 % 100 mL IVPB        1 g 200 mL/hr over 30 Minutes Intravenous  Once 07/12/22 0844 07/12/22 1218   07/12/22 0845  azithromycin (ZITHROMAX) 500 mg in sodium chloride 0.9 % 250 mL IVPB        500 mg 250 mL/hr over 60 Minutes Intravenous  Once 07/12/22 0844 07/12/22 1218       Subjective: Seen and examined at bedside and he thinks that his breathing is doing much better and leg swelling is still improving but continues to have some.  Continues to have some back pain and states that he wishes nurses will be on the same "page give me the medicines on time".  No other concerns or complaints at this time.  Objective: Vitals:   07/22/22 2006 07/23/22 0530 07/23/22 0700 07/23/22 0855  BP: 108/76 116/65    Pulse: 88 88  77  Resp: Temp: 99 F (37.2 C) 98.8 F (37.1 C)    TempSrc: Oral Oral    SpO2: 95% 95%  92%  Weight:   84.4 kg   Height:        Intake/Output Summary (Last 24 hours) at 07/23/2022 1752 Last data filed at 07/23/2022 0802 Gross per 24 hour  Intake 240 ml  Output 2450 ml  Net -2210 ml   Filed Weights   07/12/22 1219 07/22/22 0500 07/23/22 0700  Weight: 81 kg 86 kg 84.4 kg   Examination: Physical  Exam:  Constitutional: WN/WD Caucasian male in no acute distress appears little bit more comfortable Respiratory: Clear to auscultation bilaterally, no wheezing, rales, rhonchi or crackles. Normal respiratory effort and patient is not tachypenic. No accessory muscle use.  Cardiovascular: RRR, no murmurs / rubs / gallops. S1 and S2 auscultated. 1+ LE edema Abdomen: Soft, non-tender, non-distended. Bowel sounds positive.  GU: Deferred. Musculoskeletal: No clubbing / cyanosis of digits/nails. No joint deformity upper and lower extremities.  Skin: No rashes, lesions, ulcers on limited skin evaluation but has multiple tattoos noted and an ulcer on his foot that is covered. No induration; Warm and dry.  Neurologic: CN 2-12 grossly intact with no focal deficits.  Romberg sign and cerebellar reflexes not assessed.  Psychiatric: Normal judgment and insight. Alert and oriented x 3. Normal mood and appropriate affect.   Data Reviewed: I have personally reviewed following labs and imaging studies  CBC: Recent Labs  Lab 07/18/22 0221 07/20/22 1010 07/21/22 0413 07/22/22 0306 07/23/22 0240  WBC 14.7* 14.1* 10.8* 10.4 9.0  NEUTROABS  --  10.8* 7.9* 7.9* 6.4  HGB 12.2* 11.4* 10.0* 10.0* 10.1*  HCT 36.0* 33.1* 29.8* 30.0* 30.1*  MCV 92.3 90.9 91.4 91.5 92.0  PLT 353 364 335 328 366   Basic Metabolic Panel: Recent Labs  Lab 07/17/22 0300 07/18/22 0221 07/19/22 0423 07/20/22 0445 07/20/22 1010 07/21/22 0413 07/22/22 0306 07/23/22 0240  NA 126*   < > 122* 123*  --  123* 125* 130*  K 4.3   < > 4.4 4.7  --  4.0 4.0 3.9  CL 96*   < > 94* 93*  --  94* 98 99  CO2 23   < > 22 21*  --  22 20* 24  GLUCOSE 241*   < > 203* 127*  --  243* 174* 206*  BUN 23*   < >  22* 25*  --  29* 24* 21*  CREATININE 1.07   < > 1.13 1.42*  --  1.46* 1.23 1.21  CALCIUM 8.8*   < > 8.3* 8.7*  --  8.1* 8.3* 8.5*  MG 2.0  --   --  1.9  --  1.9 1.9 2.1  PHOS  --   --   --   --  3.5 3.3 3.0 3.6   < > = values in this  interval not displayed.   GFR: Estimated Creatinine Clearance: 71.3 mL/min (by C-G formula based on SCr of 1.21 mg/dL). Liver Function Tests: Recent Labs  Lab 07/17/22 0300 07/20/22 1010 07/21/22 0413 07/22/22 0306 07/23/22 0240  AST ALT ALKPHOS 115 114 114 130* 139*  BILITOT 1.2 1.9* 1.3* 1.0 0.7  PROT 6.7 6.7 6.1* 6.5 6.9  ALBUMIN 2.4* 2.3* 2.1* 2.1* 2.2*   No results for input(s): "LIPASE", "AMYLASE" in the last 168 hours. No results for input(s): "AMMONIA" in the last 168 hours. Coagulation Profile: No results for input(s): "INR", "PROTIME" in the last 168 hours. Cardiac Enzymes: No results for input(s): "CKTOTAL", "CKMB", "CKMBINDEX", "TROPONINI" in the last 168 hours. BNP (last 3 results) No results for input(s): "PROBNP" in the last 8760 hours. HbA1C: No results for input(s): "HGBA1C" in the last 72 hours. CBG: Recent Labs  Lab 07/22/22 2139 07/23/22 0746 07/23/22 1236 07/23/22 1531 07/23/22 1711  GLUCAP 206* 256* 77 162* 236*   Lipid Profile: No results for input(s): "CHOL", "HDL", "LDLCALC", "TRIG", "CHOLHDL", "LDLDIRECT" in the last 72 hours. Thyroid Function Tests: No results for input(s): "TSH", "T4TOTAL", "FREET4", "T3FREE", "THYROIDAB" in the last 72 hours. Anemia Panel: Recent Labs    07/22/22 0306  VITAMINB12 1,066*  FOLATE 13.9  FERRITIN 393*  TIBC 199*  IRON 16*  RETICCTPCT 1.3   Sepsis Labs: No results for input(s): "PROCALCITON", "LATICACIDVEN" in the last 168 hours.  Recent Results (from the past 240 hour(s))  Culture, blood (Routine X 2) w Reflex to ID Panel     Status: None   Collection Time: 07/14/22  5:25 AM   Specimen: BLOOD  Result Value Ref Range Status   Specimen Description   Final    BLOOD LEFT ANTECUBITAL Performed at [facility], [address], [place], [place] [address]    Special Requests   Final    BOTTLES DRAWN AEROBIC AND ANAEROBIC Blood Culture adequate  volume Performed at [facility], [address], [place], [place] [address]    Culture   Final    NO GROWTH 5 DAYS Performed at [facility], [address], [place], [place] [address]    Report Status 07/19/2022 FINAL  Final  Culture, blood (Routine X 2) w Reflex to ID Panel     Status: None   Collection Time: 07/14/22  5:25 AM   Specimen: BLOOD  Result Value Ref Range Status   Specimen Description   Final    BLOOD BLOOD LEFT HAND Performed at [facility], [address], [place], [place] [address]    Special Requests   Final    BOTTLES DRAWN AEROBIC AND ANAEROBIC Blood Culture adequate volume Performed at [facility], [address], [place], [place] [address]    Culture   Final    NO GROWTH 5 DAYS Performed at [facility], [address], [place], [place] [address]  Report Status 07/19/2022 FINAL  Final    Radiology Studies: No results found.  Scheduled Meds:  apixaban  5 mg Oral BID   atorvastatin  80 mg Oral Daily   budesonide (PULMICORT) nebulizer solution  0.5 mg Nebulization BID   busPIRone  10 mg Oral BID   carvedilol  3.125 mg Oral BID   Chlorhexidine Gluconate Cloth  6 each Topical Daily   clopidogrel  75 mg Oral Q breakfast   digoxin  125 mcg Oral Daily   famotidine  20 mg Oral BID   furosemide  20 mg Intravenous Q12H   insulin aspart  0-20 Units Subcutaneous TID WC   insulin glargine-yfgn  20 Units Subcutaneous Daily   lidocaine  1 patch Transdermal Daily   nystatin  5 mL Oral QID   polyethylene glycol  17 g Oral BID   senna-docusate  2 tablet Oral BID   sodium chloride flush  10-40 mL Intracatheter Q12H   Continuous Infusions:   LOS: 11 days   Marguerita Merles, DO [facility] Available via Epic secure chat 7am-7pm After these hours, please refer to coverage provider listed on [email or web address] 07/23/2022, 5:52 PM

## 2022-07-24 ENCOUNTER — Inpatient Hospital Stay (HOSPITAL_COMMUNITY): Payer: Medicaid Other

## 2022-07-24 DIAGNOSIS — M869 Osteomyelitis, unspecified: Secondary | ICD-10-CM

## 2022-07-24 LAB — GLUCOSE, CAPILLARY
Glucose-Capillary: 189 mg/dL — ABNORMAL HIGH (ref 70–99)
Glucose-Capillary: 228 mg/dL — ABNORMAL HIGH (ref 70–99)
Glucose-Capillary: 234 mg/dL — ABNORMAL HIGH (ref 70–99)

## 2022-07-24 LAB — COMPREHENSIVE METABOLIC PANEL
ALT: 32 U/L (ref 0–44)
AST: 25 U/L (ref 15–41)
Albumin: 1.9 g/dL — ABNORMAL LOW (ref 3.5–5.0)
Alkaline Phosphatase: 140 U/L — ABNORMAL HIGH (ref 38–126)
Anion gap: 7 (ref 5–15)
BUN: 15 mg/dL (ref 6–20)
CO2: 23 mmol/L (ref 22–32)
Calcium: 8.5 mg/dL — ABNORMAL LOW (ref 8.9–10.3)
Chloride: 94 mmol/L — ABNORMAL LOW (ref 98–111)
Creatinine, Ser: 1.24 mg/dL (ref 0.61–1.24)
GFR, Estimated: >60 mL/min (ref >60)
Glucose, Bld: 179 mg/dL — ABNORMAL HIGH (ref 70–99)
Potassium: 4.2 mmol/L (ref 3.5–5.1)
Sodium: 124 mmol/L — ABNORMAL LOW (ref 135–145)
Total Bilirubin: 0.7 mg/dL (ref 0.3–1.2)
Total Protein: 6.9 g/dL (ref 6.5–8.1)

## 2022-07-24 LAB — CBC WITH DIFFERENTIAL/PLATELET
Abs Immature Granulocytes: 0.08 10*3/uL — ABNORMAL HIGH (ref 0.00–0.07)
Basophils Absolute: 0.1 10*3/uL (ref 0.0–0.1)
Basophils Relative: 1 %
Eosinophils Absolute: 0.2 10*3/uL (ref 0.0–0.5)
Eosinophils Relative: 1 %
HCT: 30.5 % — ABNORMAL LOW (ref 39.0–52.0)
Hemoglobin: 10.1 g/dL — ABNORMAL LOW (ref 13.0–17.0)
Immature Granulocytes: 1 %
Lymphocytes Relative: 11 %
Lymphs Abs: 1.3 10*3/uL (ref 0.7–4.0)
MCH: 30.5 pg (ref 26.0–34.0)
MCHC: 33.1 g/dL (ref 30.0–36.0)
MCV: 92.1 fL (ref 80.0–100.0)
Monocytes Absolute: 1.2 10*3/uL — ABNORMAL HIGH (ref 0.1–1.0)
Monocytes Relative: 10 %
Neutro Abs: 9.6 10*3/uL — ABNORMAL HIGH (ref 1.7–7.7)
Neutrophils Relative %: 76 %
Platelets: 376 10*3/uL (ref 150–400)
RBC: 3.31 MIL/uL — ABNORMAL LOW (ref 4.22–5.81)
RDW: 14 % (ref 11.5–15.5)
WBC: 12.5 10*3/uL — ABNORMAL HIGH (ref 4.0–10.5)
nRBC: 0 % (ref 0.0–0.2)

## 2022-07-24 LAB — SEDIMENTATION RATE: Sed Rate: 87 mm/hr — ABNORMAL HIGH (ref 0–16)

## 2022-07-24 LAB — PHOSPHORUS: Phosphorus: 3 mg/dL (ref 2.5–4.6)

## 2022-07-24 LAB — SODIUM, URINE, RANDOM: Sodium, Ur: 74 mmol/L

## 2022-07-24 LAB — C-REACTIVE PROTEIN: CRP: 15.1 mg/dL — ABNORMAL HIGH (ref <1.0)

## 2022-07-24 LAB — SODIUM: Sodium: 128 mmol/L — ABNORMAL LOW (ref 135–145)

## 2022-07-24 LAB — MAGNESIUM: Magnesium: 1.7 mg/dL (ref 1.7–2.4)

## 2022-07-24 LAB — OSMOLALITY, URINE: Osmolality, Ur: 230 mOsm/kg — ABNORMAL LOW (ref 300–900)

## 2022-07-24 MED ORDER — GADOBUTROL 1 MMOL/ML IV SOLN
8.0000 mL | Freq: Once | INTRAVENOUS | Status: AC | PRN
  Administered 2022-07-24: 8 mL via INTRAVENOUS

## 2022-07-24 MED ORDER — SODIUM CHLORIDE 0.9 % IV SOLN
8.0000 mg/kg | Freq: Every day | INTRAVENOUS | Status: DC
  Administered 2022-07-24 – 2022-07-25 (×2): 650 mg via INTRAVENOUS
  Filled 2022-07-24 (×4): qty 13

## 2022-07-24 NOTE — Progress Notes (Signed)
Pharmacy Antibiotic Note  David [name] is a 62 y.o. male admitted on 07/12/2022 with  Osteomyelitis .  Pharmacy has been consulted for Daptomycin dosing.  ID: MRSA bacteremia + Osteo R foot -MRI spine: Mild diffuse edema involving the lower posterior paraspinous musculature. Finding is nonspecific, and could be related to overall volume status. Changes of acute myositis, which could be either infectious or inflammatory in nature could also be considered. No loculated or drainable fluid collections. No other MRI evidence for acute infection within lumbar spine. No evidence for osteomyelitis, discitis, or septic arthritis. - R foot x-ray neg for osteo but 4/21 MRI + for osteo - 4/12 TEE: no vegetation - Tmax 100.4, WBC 9, Scr 1.21  4/9 R/Z >> 4/10 4/10 Vancomycin >>4/12 4/12 dapto >> 4/15 4/15 oritavancin x 1 4/21: Dapto>>  4/9 BCx: 4/4 MRSA (susc sent out to [facility] for Dapto) 4/9 RVP: neg 4/11 BCx: NGF    Plan: Start Daptomycin /kg ([place]er ok'd) q24h for osteo   Height: 6' (182.9 cm) Weight: 83.9 kg (184 lb 15.5 oz) IBW/kg (Calculated) : 77.6  Temp (24hrs), Avg:98.9 F (37.2 C), Min:98.4 F (36.9 C), Max:99 F (37.2 C)  Recent Labs  Lab 07/20/22 0445 07/20/22 1010 07/21/22 0413 07/22/22 0306 07/23/22 0240 07/24/22 0500  WBC  --  14.1* 10.8* 10.4 9.0 12.5*  CREATININE 1.42*  --  1.46* 1.23 1.21 1.24    Estimated Creatinine Clearance: 69.5 mL/min (by C-G formula based on SCr of 1.24 mg/dL).    No Known Allergies  David [name] S. David [name], PharmD, BCPS Clinical Staff Pharmacist [email or web address]  Pasty Spillers 07/24/2022 6:04 PM

## 2022-07-24 NOTE — Progress Notes (Signed)
Attending rounded on pt. Removed rt. Foot bandage. RN follow up: Red area marked with sharpie & dated. Cleansed NS, patted dry., swabbed all wounded areas with betadine, applied xeroform strips to affected areas, covered with dry 4x4 gauze & secured all with kerlix. Pt tolerated well. Pain addressed, see MAR.

## 2022-07-24 NOTE — Progress Notes (Signed)
PROGRESS NOTE    David [name]  ZOX:[id number] DOB: May 02, 1960 DOA: 07/12/2022 PCP: [name], MD   Brief Narrative:  The patient is a 62 y.o. 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 His appetite has been decreased. He had a fever at home of 105 F. He was having significant chills and rigors.  His rigors were so intense that produced the above-mentioned back pain.  He was found to have recurrent MRSA bacteremia but given that no vegetations on his TEE ID recommended 2 weeks of total antibiotics but given his financial issues they have given him a dose of oritavancin now.  Sodium remains on the low side.   Nephrology was consulted and now they have started the patient on Lasix twice daily IV and also given 1 dose of tolvaptan 15 mg x 1 did not have much overt response to tolvaptan but sodium slowly came up and nephrology recommends continuing diuresis and current regimen today given his robust urine output.   **Interim History Patient continues to improve and shortness of breath as well as leg edema improving.  Sodium is improved to 130 yesterday but dropped to 124 today.  Nephrology has now stopped diuretics and repeating sodium every 6 hours.  They feel that he may have been over diuresed.  Patient continues complain of significant back pain and started getting low-grade temperatures.  Will check an ESR and CRP and after further evaluation the the right foot ulceration appears worse so we will get an MRI and repeat blood cultures.  Showed concern for acute osteomyelitis so we will consult  orthopedic surgery and reengage infectious diseases.  Infectious diseases has recommended resuming daptomycin.  Assessment and Plan:  Sepsis due to MRSA bacteremia in the setting of now acute right foot osteomyelitis -Blood cultures obtained on admission showing MRSA.   -He has a history of MRSA bacteremia in 2023 felt to be due to diabetic toe infections.   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 [name].  -Due to severe back pain he underwent imaging, MRI of the T and L-spine with nonspecific findings, no osteomyelitis or discitis.   -2D echo and TEE without vegetations.   -WBC Trend: Recent Labs  Lab 07/17/22 0300 07/18/22 0221 07/20/22 1010 07/21/22 0413 07/22/22 0306 07/23/22 0240 07/24/22 0500  WBC 15.9* 14.7* 14.1* 10.8* 10.4 9.0 12.5*  -ID added recommends total of 2 weeks of antibiotics but now will reengage given that he has an acute osteo as well as.   -Checking ESR and a CRP and ESR was 87 and CRP was 15.1 -PICC line placed, and there were plans in place for him to get home health, however due to financial reasons this could not be arranged and patient received a dose of oritavancin on 4/15 and now will be reinitiated on daptomycin on 07/24/2022   Right foot ulcer with acute osteomyelitis of the residual fifth metatarsal diaphysis extending to the level of the proximal metaphysis, worsening -At the site of prior ray amputation.  Patient initially reported as improving -Plain imaging without osteomyelitis, wound initially looked good without any evidence of deep infection however when I examined it today  there was concern for deeper infection so I ordered an MRI -MRI done and showed "Postsurgical changes from fifth ray resection to the level of the distal fifth metatarsal diaphysis. 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 Soft tissue wound or ulceration at the lateral  aspect of the forefoot near the residual fifth metatarsal with enhancing sinus tract extending to the underlying resection margin. Prior fourth ray resection at the level of the distal fourth metatarsal diaphysis without evidence of osteomyelitis. Diffuse edema-like intramuscular signal which may be related to denervation or myositis." -Discussed the case with ID again who reevaluate the patient in the morning but have told me to start the patient on Daptomycin -I have also reached out to on-call Ortho Dr. Susa Simmonds who will see the patient in consultation tomorrow -After reinitiating antibiotics will obtain blood cultures again -[facility] consult and he has palpable care recommendations and wound nurse recommending have the patient follow-up with outpatient wound care center and podiatric medicine at discharge   Severe Back pain  -Lumbar MRI with diffuse edema involving the lower posterior paraspinous musculature, nonspecific, acute myositis possible infectious versus inflammatory.   -MRI T-spine done and showed "No MRI evidence for acute infection within the thoracic spine. Small layering bilateral pleural effusions. Mild chronic compression deformities about the T2 through T6 as well as the T9 vertebral bodies. Underlying mild for age thoracic spondylosis without significant stenosis or neural impingement." -There is no loculated or drainable fluid collections.  Negative for discitis/osteomyelitis.   -Continue oxycodone, Flexeril made him loopy and he was switched to Robaxin: Also gets IV hydrocodone and increase the frequency given his continued back discomfort and pain -C/w bowel regimen  -May need to discuss with neurosurgery about back bracing if not improving; continues to have significant back pain and may need to be reimaged given that acute osteo found in his foot; will check ESR and CRP   Hyponatremia  -Stable for most part but with sudden worsening of 121 on 4/15, after a bolus of fluids  for hypotension (possibly induced by pain medications).   -He also tells me he drinks large amounts of water several pitchers per day.   -He was placed on water restriction, sodium slightly better, continue to monitor.   -Na+ Trend:  Recent Labs  Lab 07/19/22 0423 07/20/22 0445 07/21/22 0413 07/22/22 0306 07/23/22 0240 07/24/22 0500 07/24/22 1555  NA 122* 123* 123* 125* 130* 124* 128*  -Patient did not have much of a response to tolvaptan 15 mg yesterday so nephrology recommending holding off further dosing but recommended continue IV Lasix 20 mg twice daily to see if sodium comes up with diuresis and recommending for at least 24 hours or more -Baseline is around 128-130.  If he gets in the upper 120s could potentially go home but he continues to be volume overloaded continue diuresis for now -Appreciate nephrology assistance and help   Essential Hypertension  -Continue his Sherryll Burger was held due to Hypotension, Coreg dose decreased. -Continue to Monitor BP per Protocol  -Last blood pressure reading was on the softer side at 97/66   CKD 3A Metabolic Acidosis -Baseline creatinine 1.2-1.5 -BUN/Cr Trend: Recent Labs  Lab 07/18/22 0221 07/19/22 0423 07/20/22 0445 07/21/22 0413 07/22/22 0306 07/23/22 0240 07/24/22 0500  BUN 21* 22* 25* 29* 24* 21* 15  CREATININE 1.17 1.13 1.42* 1.46* 1.23 1.21 1.24  -CO2 was 21, anion gap was 9, chloride level was 93 -Avoid Nephrotoxic  Medications, Contrast Dyes, Hypotension and Dehydration to Ensure Adequate Renal Perfusion and will need to Renally Adjust Meds -Continue to Monitor and Trend Renal Function carefully and repeat CMP in the AM    Hypomagnesemia -Patient's Mag Level Trend: Recent Labs  Lab 07/14/22 0525 07/17/22 0300 07/20/22 0445 07/21/22 0413 07/22/22 0306 07/23/22 0240 07/24/22 0500  MG 2.1 2.0 1.9 1.9 1.9 2.1 1.7  -Continue to Monitor and Replete as Necessary -Repeat Mag in the AM    Hyperbilirubinemia -Bilirubin  Trend: Recent Labs  Lab 07/14/22 0525 07/17/22 0300 07/20/22 1010 07/21/22 0413 07/22/22 0306 07/23/22 0240 07/24/22 0500  BILITOT 0.9 1.2 1.9* 1.3* 1.0 0.7 0.7  -Continue to Monitor and Trend and Repeat CMP in the AM   Atrial Fibrillation -Rate controlled, continue Eliquis, Coreg.   -Continue to Monitor on Telemetry  -Followed by cardiology as an outpatient   Acute on Chronic Combined CHF  -His EF has improved and now normalized.  Diuretics were discontinued due to his hyponatremia, continue fluid restriction, Entresto also held due to hypotension.   -Nephrology on diuresing the patient and now stopped -Strict I's and O's and Daily Weights  Intake/Output Summary (Last 24 hours) at 07/24/2022 2045 Last data filed at 07/24/2022 1049 Gross per 24 hour  Intake 480 ml  Output 2350 ml  Net -1870 ml    -Nephrology was consulted given his hyponatremia and he is was getting diuresis with IV Lasix 20 g twice daily but they have now stopped it -Will need cardiology follow-up closely charge   Hallucinations, improving  -Nursing staff states that the patient was hallucinating about bugs -Continue to monitor and placed on delirium precautions -May need head imaging if continues to persist but he seemed appropriate today   Normocytic Anemia -Hgb/Hct Trend Recent Labs  Lab 07/17/22 0300 07/18/22 0221 07/20/22 1010 07/21/22 0413 07/22/22 0306 07/23/22 0240 07/24/22 0500  HGB 12.4* 12.2* 11.4* 10.0* 10.0* 10.1* 10.1*  HCT 36.9* 36.0* 33.1* 29.8* 30.0* 30.1* 30.5*  MCV 92.9 92.3 90.9 91.4 91.5 92.0 92.1  -Check Anemia Panel and is now showing an iron level of 16, UIBC 183, TIBC 199, saturation ratios of 8%, ferritin level of 393, folate level of 13.9, vitamin B12 of 1066 -Continue to monitor for signs and symptoms of bleeding; no overt bleeding noted -Repeat CBC in a.m.   Hyperlipidemia  -Continue Atorvastatin 80 mg po Daily   IDDM, poorly controlled, with  hyperglycemia -Continue Insulin Glargine 20 units sq Daily -C/w Resistant Novolog SSI AC -Continue to Monitor CBGs carefully; CBG Trend: Recent Labs  Lab 07/23/22 0746 07/23/22 1236 07/23/22 1531 07/23/22 1711 07/23/22 2032 07/24/22 1043 07/24/22 1630  GLUCAP 256* 77 162* 236* 213* 189* 228*    Hypoalbuminemia -Patient's Albumin Trend: Recent Labs  Lab 07/17/22 0300 07/18/22 0221 07/20/22 1010 07/21/22 0413 07/22/22 0306 07/23/22 0240 07/24/22 0500  WBC 15.9* 14.7* 14.1* 10.8* 10.4 9.0 12.5*  -Continue to Monitor and Trend and repeat CMP in the AM  DVT prophylaxis:  apixaban (ELIQUIS) tablet 5 mg    Code Status: Full Code Family Communication: No family currently at bedside  Disposition Plan:  Level of care: Med-Surg Status is: Inpatient Remains inpatient appropriate because: His further clinical improvement and clearance by specialist now we have called the infectious disease doctor back as well as the orthopedic surgeon  Consultants:  Infectious diseases Orthopedic surgery  Procedures:  As delineated as above  Antimicrobials:  Anti-infectives (From admission, onward)    Start  Dose/Rate Route Frequency Ordered Stop   07/24/22 1845  DAPTOmycin (CUBICIN) 650 mg in sodium chloride 0.9 % IVPB        8 mg/kg  83.9 kg 126 mL/hr over 30 Minutes Intravenous Daily 07/24/22 1759     07/18/22 1500  Oritavancin Diphosphate (ORBACTIV) 1,200 mg in dextrose 5 % IVPB        1,200 mg 333.3 mL/hr over 180 Minutes Intravenous Once 07/18/22 1351 07/18/22 1822   07/16/22 0000  daptomycin (CUBICIN) IVPB  Status:  Discontinued        650 mg Intravenous Every 24 hours 07/16/22 1116 07/19/22    07/15/22 2000  DAPTOmycin (CUBICIN) 650 mg in sodium chloride 0.9 % IVPB  Status:  Discontinued        8 mg/kg  81 kg 126 mL/hr over 30 Minutes Intravenous Daily 07/15/22 1411 07/15/22 1413   07/15/22 2000  DAPTOmycin (CUBICIN) 650 mg in sodium chloride 0.9 % IVPB  Status:   Discontinued        8 mg/kg  81 kg 126 mL/hr over 30 Minutes Intravenous Daily 07/15/22 1413 07/18/22 1351   07/14/22 0200  vancomycin (VANCOREADY) IVPB 1750 mg/350 mL  Status:  Discontinued        1,750 mg 175 mL/hr over 120 Minutes Intravenous Every 24 hours 07/13/22 0718 07/15/22 1411   07/13/22 1000  cefTRIAXone (ROCEPHIN) 1 g in sodium chloride 0.9 % 100 mL IVPB  Status:  Discontinued        1 g 200 mL/hr over 30 Minutes Intravenous Every 24 hours 07/12/22 1405 07/13/22 0239   07/13/22 1000  azithromycin (ZITHROMAX) 500 mg in sodium chloride 0.9 % 250 mL IVPB  Status:  Discontinued        500 mg 250 mL/hr over 60 Minutes Intravenous Every 24 hours 07/12/22 1405 07/13/22 0239   07/13/22 0330  vancomycin (VANCOREADY) IVPB 1750 mg/350 mL        1,750 mg 175 mL/hr over 120 Minutes Intravenous  Once 07/13/22 0240 07/13/22 0517   07/12/22 0845  cefTRIAXone (ROCEPHIN) 1 g in sodium chloride 0.9 % 100 mL IVPB        1 g 200 mL/hr over 30 Minutes Intravenous  Once 07/12/22 0844 07/12/22 1218   07/12/22 0845  azithromycin (ZITHROMAX) 500 mg in sodium chloride 0.9 % 250 mL IVPB        500 mg 250 mL/hr over 60 Minutes Intravenous  Once 07/12/22 0844 07/12/22 1218        Subjective: Seen and examined at bedside and she tells was not feeling great and continue complaining of significant back pain.  Continues to have low-grade temperatures.  After further evaluation right foot is erythematous and swollen and painful to palpate.  There is concern for deeper infection so I ordered MRI.  Patient frustrated that his sodium was improving but now dropped.  Denies any other concerns or complaints at this time.  Objective: Vitals:   07/24/22 1310 07/24/22 1717 07/24/22 1736 07/24/22 1858  BP: 111/82  (!) 166/95 97/66  Pulse: 84  98 78  Resp: 18     Temp: 98.4 F (36.9 C) 99 F (37.2 C)  99.8 F (37.7 C)  TempSrc: Oral Oral  Oral  SpO2: 97%  99% 94%  Weight:      Height:         Intake/Output Summary (Last 24 hours) at 07/24/2022 2043 Last data filed at 07/24/2022 1049 Gross per 24 hour  Intake 480 ml  Output 2350 ml  Net -1870 ml   Filed Weights   07/22/22 0500 07/23/22 0700 07/24/22 0500  Weight: 86 kg 84.4 kg 83.9 kg   Examination: Physical Exam:  Constitutional: WN/WD Caucasian male who appears uncomfortable complaining of some pain Respiratory: Diminished to auscultation bilaterally with some coarse breath sounds, no wheezing, rales, rhonchi or crackles. Normal respiratory effort and patient is not tachypenic. No accessory muscle use.  Unlabored breathing Cardiovascular: RRR, no murmurs / rubs / gallops. S1 and S2 auscultated.  Has 1+ lower extremity edema and some pedal edema on the right which is pretty puffy Abdomen: Soft, non-tender, distended secondary to body habitus. Bowel sounds positive.  GU: Deferred. Musculoskeletal: No clubbing / cyanosis of digits/nails.  Has amputations noted on his right foot Skin: Has an ulceration on his right fifth toe and an ulcer on the bottom of his heel Neurologic: CN 2-12 grossly intact with no focal deficits. Romberg sign and cerebellar reflexes not assessed.  Psychiatric: Normal judgment and insight. Alert and oriented x 3. Normal mood and appropriate affect.   Data Reviewed: I have personally reviewed following labs and imaging studies  CBC: Recent Labs  Lab 07/20/22 1010 07/21/22 0413 07/22/22 0306 07/23/22 0240 07/24/22 0500  WBC 14.1* 10.8* 10.4 9.0 12.5*  NEUTROABS 10.8* 7.9* 7.9* 6.4 9.6*  HGB 11.4* 10.0* 10.0* 10.1* 10.1*  HCT 33.1* 29.8* 30.0* 30.1* 30.5*  MCV 90.9 91.4 91.5 92.0 92.1  PLT 364 335 328 366 376   Basic Metabolic Panel: Recent Labs  Lab 07/20/22 0445 07/20/22 1010 07/21/22 0413 07/22/22 0306 07/23/22 0240 07/24/22 0500 07/24/22 1555  NA 123*  --  123* 125* 130* 124* 128*  K 4.7  --  4.0 4.0 3.9 4.2  --   CL 93*  --  94* 98 99 94*  --   CO2 21*  --  22 20* 24 23  --    GLUCOSE 127*  --  243* 174* 206* 179*  --   BUN 25*  --  29* 24* 21* 15  --   CREATININE 1.42*  --  1.46* 1.23 1.21 1.24  --   CALCIUM 8.7*  --  8.1* 8.3* 8.5* 8.5*  --   MG 1.9  --  1.9 1.9 2.1 1.7  --   PHOS  --  3.5 3.3 3.0 3.6 3.0  --    GFR: Estimated Creatinine Clearance: 69.5 mL/min (by C-G formula based on SCr of 1.24 mg/dL). Liver Function Tests: Recent Labs  Lab 07/20/22 1010 07/21/22 0413 07/22/22 0306 07/23/22 0240 07/24/22 0500  AST 22 22 25 26 25   ALT 29 27 29 31  32  ALKPHOS 114 114 130* 139* 140*  BILITOT 1.9* 1.3* 1.0 0.7 0.7  PROT 6.7 6.1* 6.5 6.9 6.9  ALBUMIN 2.3* 2.1* 2.1* 2.2* 1.9*   No results for input(s): "LIPASE", "AMYLASE" in the last 168 hours. No results for input(s): "AMMONIA" in the last 168 hours. Coagulation Profile: No results for input(s): "INR", "PROTIME" in the last 168 hours. Cardiac Enzymes: No results for input(s): "CKTOTAL", "CKMB", "CKMBINDEX", "TROPONINI" in the last 168 hours. BNP (last 3 results) No results for input(s): "PROBNP" in the last 8760 hours. HbA1C: No results for input(s): "HGBA1C" in the last 72 hours. CBG: Recent Labs  Lab 07/23/22 1531 07/23/22 1711 07/23/22 2032 07/24/22 1043 07/24/22 1630  GLUCAP 162* 236* 213* 189* 228*   Lipid Profile: No results for input(s): "CHOL", "HDL", "LDLCALC", "TRIG", "CHOLHDL", "LDLDIRECT" in the last 72 hours. Thyroid  Function Tests: No results for input(s): "TSH", "T4TOTAL", "FREET4", "T3FREE", "THYROIDAB" in the last 72 hours. Anemia Panel: Recent Labs    07/22/22 0306  VITAMINB12 1,066*  FOLATE 13.9  FERRITIN 393*  TIBC 199*  IRON 16*  RETICCTPCT 1.3   Sepsis Labs: No results for input(s): "PROCALCITON", "LATICACIDVEN" in the last 168 hours.  No results found for this or any previous visit (from the past 240 hour(s)).   Radiology Studies: MR FOOT RIGHT W WO CONTRAST  Result Date: 07/24/2022 CLINICAL DATA:  Diabetic foot wound EXAM: MRI OF THE RIGHT FOREFOOT  WITHOUT AND WITH CONTRAST TECHNIQUE: Multiplanar, multisequence MR imaging of the right forefoot was performed before and after the administration of intravenous contrast. CONTRAST:  8mL GADAVIST GADOBUTROL 1 MMOL/ML IV SOLN COMPARISON:  X-ray 07/13/2022 FINDINGS: Bones/Joint/Cartilage Postsurgical changes from fifth ray resection at the level of the distal fifth metatarsal diaphysis. There is intense bone marrow edema and enhancement of the residual fifth metatarsal diaphysis extending to the level of the proximal metaphysis (series 6, image 15). Confluent low T1 signal changes near the resection margin are compatible with acute osteomyelitis. Prior fourth ray resection at the level of the distal fourth metatarsal diaphysis without evidence of osteomyelitis. Remaining osseous structures are intact. No additional sites of osteomyelitis. No fracture or dislocation. Ligaments Intact Lisfranc ligament. Remaining collateral ligaments are intact. Muscles and Tendons Amputation changes to the musculotendinous structures at the lateral forefoot. Diffuse edema-like intramuscular signal which may be related to denervation or myositis. No tenosynovitis. Soft tissues Soft tissue wound or ulceration at the lateral aspect of the forefoot near the residual fifth metatarsal with enhancing sinus tract extending to the underlying resection margin (series 9, images 10-13). Subcutaneous edema of the dorsal forefoot. No organized or drainable fluid collections. IMPRESSION: 1. Postsurgical changes from fifth ray resection to the level of the distal fifth metatarsal diaphysis. 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 2. Soft tissue wound or ulceration at the lateral aspect of the forefoot near the residual fifth metatarsal with enhancing sinus tract extending to the underlying resection margin. 3. Prior fourth ray resection at the level of the distal fourth  metatarsal diaphysis without evidence of osteomyelitis. 4. Diffuse edema-like intramuscular signal which may be related to denervation or myositis. Electronically Signed   By: Duanne Guess D.O.   On: 07/24/2022 15:58   DG Chest 2 View  Result Date: 07/24/2022 CLINICAL DATA:  Follow-up. Worsening dyspnea. Mild productive cough EXAM: CHEST - 2 VIEW COMPARISON:  07/20/2022 x-ray FINDINGS: Stable right-sided PICC. Persistent moderate right effusion and adjacent opacities. Increasing opacity left lung base. No pneumothorax. Stable cardiopericardial silhouette. Increasing vascular congestion. Tortuous aorta. Dilated stomach with air-fluid level seen beneath the left hemidiaphragm. Please correlate with any symptoms IMPRESSION: Increasing pleural effusion on the right and bilateral lung base opacities. Increasing vascular congestion Dilated stomach with an air-fluid level beneath the left hemidiaphragm. Please correlate with any symptoms and additional workup as clinically appropriate Electronically Signed   By: Karen Kays M.D.   On: 07/24/2022 15:36    Scheduled Meds:  apixaban  5 mg Oral BID   atorvastatin  80 mg Oral Daily   budesonide (PULMICORT) nebulizer solution  0.5 mg Nebulization BID   busPIRone  10 mg Oral BID   carvedilol  3.125 mg Oral BID   Chlorhexidine Gluconate Cloth  6 each Topical Daily   clopidogrel  75 mg Oral Q breakfast   digoxin  125 mcg Oral Daily   famotidine  20 mg Oral BID   insulin aspart  0-20 Units Subcutaneous TID WC   insulin glargine-yfgn  20 Units Subcutaneous Daily   lidocaine  1 patch Transdermal Daily   nystatin  5 mL Oral QID   polyethylene glycol  17 g Oral BID   senna-docusate  2 tablet Oral BID   sodium chloride flush  10-40 mL Intracatheter Q12H   Continuous Infusions:  DAPTOmycin (CUBICIN) 650 mg in sodium chloride 0.9 % IVPB 650 mg (07/24/22 1903)    LOS: 12 days   Marguerita Merles, DO [facility] Available via Epic secure chat  7am-7pm After these hours, please refer to coverage provider listed on [email or web address] 07/24/2022, 8:43 PM

## 2022-07-24 NOTE — Progress Notes (Signed)
[facility] Progress Note  Subjective: seen in room, had 2L UOP yest and 1750 today so far. Na+ dropped to 124 today. Creat stable 1.2. pt's main c/o is recurrent mid back pain, come at night and is severe. Getting IV dilaudid q 2hrs prn per the pmd. No further SOB.   Vitals:   07/24/22 0459 07/24/22 0500 07/24/22 0905 07/24/22 1207  BP: (!) 154/94     Pulse: 82     Resp: 20     Temp: 99 F (37.2 C)   99 F (37.2 C)  TempSrc: Oral   Oral  SpO2: 97%  92%   Weight:  83.9 kg    Height:        Exam: Gen alert, nad, calm, looks much better w/ regards to resp status Chest minimal crackles at bases, mostly clear RRR no MRG Abd soft ntnd no mass or ascites +bs GU normal male MS no joint effusions or deformity Ext residual 1+ pretib nonpitting edema bilat Neuro is alert, Ox 3 , nf      Date                            Creat               eGFR  2019- 2020                 0.99- 1.24  2021                           0.97- 1.20  2022                           1.32  Jan- June 2023          0.96- 1.35        > 60   Jul- dec 2023             1.40- 1.73        45- 50 ml/min    4/06                            1.38, admit        4/10                            1.32                 > 60 ml/min  4/15                            1.17  4/17                            1.42                 57 ml/min  4/18                            1.46                 55 ml/min       CXR 4/13 - IMPRESSION: Ill-defined opacity at the RIGHT lung base, atelectasis versus pneumonia. Ground-glass opacities within the LEFT  upper lung, suspicious for multifocal pneumonia.    CXR 4/17 - IMPRESSION: Increasing right basilar infiltrate with associated effusion. (My read = +vasc congestion)    UNa < 10, UOsm 298   UCreat 108    Assessment/ Plan: Hyponatremia - hypervolemic hyponatremia, suspected decomp diast CHF. UNa is low c/w CHF exacerbation. Didn't respond to tolvaptan  x 1.  Na+ improved however  w/ continued diuresis, up to 130 yesterday. Today Na + down to 124, big drop, not sure cause. Possibly overdiuresed. Will stop IV lasix, get repeat CXR, urine lytes and repeat Na+ levels q 6 today. Have d/w patient.  AKI on CKD 3a - b/l creat 1.3- 1.7 from 2023, 45- >60 ml/min. Creat here stable in 1.2- 1.4 range. SOB, edema much better.  Vol overload - sig improvement in SOB and edema. Wt's not down, not sure if accurate.  Back pain - main c/o the last couple of days, has had MRI imaging w/ no report of osteo/ discitis, etc. Have d/w pmd MRSA bacteremia - 2 weeks of IV abx completed per pmd/ ID DM2 - on insulin, sig complications H/o osteomyelitis R foot - sp ray amp prior admission in Dec 2023 HTN - per pmd Atrial fib   Vinson Moselle MD [facility] 07/24/2022, 12:44 PM  Recent Labs  Lab 07/23/22 0240 07/24/22 0500  HGB 10.1* 10.1*  ALBUMIN 2.2* 1.9*  CALCIUM 8.5* 8.5*  PHOS 3.6 3.0  CREATININE 1.21 1.24  K 3.9 4.2    Recent Labs  Lab 07/22/22 0306  IRON 16*  TIBC 199*  FERRITIN 393*    Inpatient medications:  apixaban  5 mg Oral BID   atorvastatin  80 mg Oral Daily   budesonide (PULMICORT) nebulizer solution  0.5 mg Nebulization BID   busPIRone  10 mg Oral BID   carvedilol  3.125 mg Oral BID   Chlorhexidine Gluconate Cloth  6 each Topical Daily   clopidogrel  75 mg Oral Q breakfast   digoxin  125 mcg Oral Daily   famotidine  20 mg Oral BID   insulin aspart  0-20 Units Subcutaneous TID WC   insulin glargine-yfgn  20 Units Subcutaneous Daily   lidocaine  1 patch Transdermal Daily   nystatin  5 mL Oral QID   polyethylene glycol  17 g Oral BID   senna-docusate  2 tablet Oral BID   sodium chloride flush  10-40 mL Intracatheter Q12H    acetaminophen **OR** acetaminophen, Glycerin (Adult), HYDROmorphone (DILAUDID) injection, hydrOXYzine, ipratropium-albuterol, magic mouthwash w/lidocaine, methocarbamol, Muscle Rub, ondansetron **OR** ondansetron (ZOFRAN) IV, mouth rinse,  oxyCODONE, sodium chloride flush, sorbitol, milk of mag, mineral oil, glycerin (SMOG) enema

## 2022-07-24 NOTE — Progress Notes (Signed)
PT Cancellation Note  Patient Details Name: David [name] MRN: [id number] DOB: [birth year]   Cancelled Treatment:    Reason Eval/Treat Not Completed: PT screened, no needs identified, will sign off Pt mobilizing modified independent around room per mobility documentation and per nursing staff.  No acute PT needs identified at this time.   Janan Halter [place] 07/24/2022, 2:51 PM Paulino Door, DPT Physical Therapist Acute Rehabilitation Services Office: [phone]

## 2022-07-25 ENCOUNTER — Inpatient Hospital Stay (HOSPITAL_COMMUNITY): Payer: Medicaid Other

## 2022-07-25 ENCOUNTER — Ambulatory Visit: Payer: Self-pay | Admitting: Family Medicine

## 2022-07-25 DIAGNOSIS — E1169 Type 2 diabetes mellitus with other specified complication: Secondary | ICD-10-CM

## 2022-07-25 DIAGNOSIS — M60009 Infective myositis, unspecified site: Secondary | ICD-10-CM

## 2022-07-25 DIAGNOSIS — M86179 Other acute osteomyelitis, unspecified ankle and foot: Secondary | ICD-10-CM

## 2022-07-25 DIAGNOSIS — M4624 Osteomyelitis of vertebra, thoracic region: Secondary | ICD-10-CM

## 2022-07-25 DIAGNOSIS — M4644 Discitis, unspecified, thoracic region: Secondary | ICD-10-CM

## 2022-07-25 DIAGNOSIS — M6008 Infective myositis, other site: Secondary | ICD-10-CM

## 2022-07-25 LAB — CULTURE, BLOOD (ROUTINE X 2)
Culture: NO GROWTH
Special Requests: ADEQUATE

## 2022-07-25 LAB — MAGNESIUM: Magnesium: 1.7 mg/dL (ref 1.7–2.4)

## 2022-07-25 LAB — CBC WITH DIFFERENTIAL/PLATELET
Abs Immature Granulocytes: 0.06 10*3/uL (ref 0.00–0.07)
Basophils Absolute: 0.1 10*3/uL (ref 0.0–0.1)
Basophils Relative: 1 %
Eosinophils Absolute: 0.2 10*3/uL (ref 0.0–0.5)
Eosinophils Relative: 2 %
HCT: 28.5 % — ABNORMAL LOW (ref 39.0–52.0)
Hemoglobin: 9.5 g/dL — ABNORMAL LOW (ref 13.0–17.0)
Immature Granulocytes: 1 %
Lymphocytes Relative: 9 %
Lymphs Abs: 0.9 10*3/uL (ref 0.7–4.0)
MCH: 30.4 pg (ref 26.0–34.0)
MCHC: 33.3 g/dL (ref 30.0–36.0)
MCV: 91.3 fL (ref 80.0–100.0)
Monocytes Absolute: 1 10*3/uL (ref 0.1–1.0)
Monocytes Relative: 11 %
Neutro Abs: 7.4 10*3/uL (ref 1.7–7.7)
Neutrophils Relative %: 76 %
Platelets: 318 10*3/uL (ref 150–400)
RBC: 3.12 MIL/uL — ABNORMAL LOW (ref 4.22–5.81)
RDW: 13.8 % (ref 11.5–15.5)
WBC: 9.6 10*3/uL (ref 4.0–10.5)
nRBC: 0 % (ref 0.0–0.2)

## 2022-07-25 LAB — COMPREHENSIVE METABOLIC PANEL
ALT: 31 U/L (ref 0–44)
AST: 25 U/L (ref 15–41)
Albumin: 2.1 g/dL — ABNORMAL LOW (ref 3.5–5.0)
Alkaline Phosphatase: 125 U/L (ref 38–126)
Anion gap: 8 (ref 5–15)
BUN: 14 mg/dL (ref 6–20)
CO2: 23 mmol/L (ref 22–32)
Calcium: 8.4 mg/dL — ABNORMAL LOW (ref 8.9–10.3)
Chloride: 95 mmol/L — ABNORMAL LOW (ref 98–111)
Creatinine, Ser: 1.06 mg/dL (ref 0.61–1.24)
GFR, Estimated: >60 mL/min (ref >60)
Glucose, Bld: 209 mg/dL — ABNORMAL HIGH (ref 70–99)
Potassium: 3.8 mmol/L (ref 3.5–5.1)
Sodium: 126 mmol/L — ABNORMAL LOW (ref 135–145)
Total Bilirubin: 0.8 mg/dL (ref 0.3–1.2)
Total Protein: 6.8 g/dL (ref 6.5–8.1)

## 2022-07-25 LAB — PHOSPHORUS: Phosphorus: 3.6 mg/dL (ref 2.5–4.6)

## 2022-07-25 LAB — SODIUM: Sodium: 126 mmol/L — ABNORMAL LOW (ref 135–145)

## 2022-07-25 LAB — LACTIC ACID, PLASMA: Lactic Acid, Venous: 0.8 mmol/L (ref 0.5–1.9)

## 2022-07-25 LAB — GLUCOSE, CAPILLARY
Glucose-Capillary: 167 mg/dL — ABNORMAL HIGH (ref 70–99)
Glucose-Capillary: 214 mg/dL — ABNORMAL HIGH (ref 70–99)
Glucose-Capillary: 98 mg/dL (ref 70–99)
Glucose-Capillary: 173 mg/dL — ABNORMAL HIGH (ref 70–99)

## 2022-07-25 LAB — PROCALCITONIN: Procalcitonin: 0.13 ng/mL

## 2022-07-25 MED ORDER — GADOBUTROL 1 MMOL/ML IV SOLN: 8.0000 mL | Freq: Once | INTRAVENOUS | Status: DC | PRN

## 2022-07-25 MED ORDER — SODIUM CHLORIDE 0.9 % IV SOLN
250.0000 mg | Freq: Every day | INTRAVENOUS | Status: DC
  Administered 2022-07-25 – 2022-07-27 (×3): 250 mg via INTRAVENOUS
  Filled 2022-07-25 (×4): qty 20

## 2022-07-25 MED ORDER — FUROSEMIDE 40 MG PO TABS
40.0000 mg | ORAL_TABLET | Freq: Every day | ORAL | Status: DC
  Administered 2022-07-25 – 2022-08-03 (×10): 40 mg via ORAL
  Filled 2022-07-25 (×10): qty 1

## 2022-07-25 NOTE — Progress Notes (Signed)
Chaplain engaged in an initial visit with [name] who was up walking and appeared to be very anxious. He voiced that he was in pain and had a lot going on in his personal life. 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 He vocalized a plan to ask his family and support system to help him with various things before it comes to a point that he cannot control. He asked Chaplain to come back tomorrow and offer prayer over him.  Chaplain affirmed his plan to utilize his community for help. Chaplain also prayed over him. Chaplain let team know of his pain and anxiety.     07/25/22 1000  Spiritual Encounters  Type of Visit Initial;Follow up  Reason for visit Routine spiritual support

## 2022-07-25 NOTE — Progress Notes (Signed)
[facility] Progress Note  Subjective: seen in room, 800 UOP recorded. Na+ 126 today. Creat stable  pt's main c/o is recurrent mid back pain, come at night and is severe. Getting IV dilaudid q 2hrs prn per the pmd. SOB improved. Is very distressed about financial issues -  most recent urine osm low but that was when he was getting lasix -  also having nosebleed   Vitals:   07/25/22 0410 07/25/22 0500 07/25/22 0825 07/25/22 1152  BP: 115/89   127/86  Pulse: 73   67  Resp: 20   16  Temp: 98.9 F (37.2 C)   98 F (36.7 C)  TempSrc: Oral   Oral  SpO2: 96%  95% 98%  Weight:  84.5 kg    Height:        Exam: Gen alert, nad, calm, looks much better w/ regards to resp status but now anxious for other reasons -  nosebleed this AM Chest minimal crackles at bases, mostly clear RRR no MRG Abd soft ntnd no mass or ascites +bs GU normal male MS no joint effusions or deformity Ext residual 1+ pretib edema bilat Neuro is alert, Ox 3 , nf      Date                            Creat               eGFR  2019- 2020                 0.99- 1.24  2021                           0.97- 1.20  2022                           1.32  Jan- June 2023          0.96- 1.35        > 60   Jul- dec 2023             1.40- 1.73        45- 50 ml/min    4/06                            1.38, admit        4/10                            1.32                 > 60 ml/min  4/15                            1.17  4/17                            1.42                 57 ml/min  4/18                            1.46                 55  ml/min       CXR 4/13 - IMPRESSION: Ill-defined opacity at the RIGHT lung base, atelectasis versus pneumonia. Ground-glass opacities within the LEFT upper lung, suspicious for multifocal pneumonia.    CXR 4/17 - IMPRESSION: Increasing right basilar infiltrate with associated effusion. (My read = +vasc congestion)    UNa < 10, UOsm 298   UCreat 108    Assessment/ Plan: Hyponatremia  - hypervolemic hyponatremia. UNa is low c/w CHF exacerbation. Didn't respond to tolvaptan  x 1.  Na+ improved however w/ continued diuresis, up to 130 , then down to Na + down to 124,  stopped IV lasix, back up to 126. Urine osm c/w hypervolemic hyponatremia as urine osm is pretty approp-  need to resume some lasix to keep urine dilute and continue fluid restriction AKI on CKD 3a - b/l creat 1.3- 1.7 from 2023, 45- >60 ml/min. Creat here stable and now normal Vol overload - sig improvement in SOB and edema. Wt's not down, not sure if accurate.  Need to resume lasix -  will do 40 PO daily.  May be having some third spacing with low albumin as well Back pain - main c/o the last couple of days, has had MRI imaging w/ no report of osteo/ discitis, etc.  MRSA bacteremia - 2 weeks of IV abx completed per pmd/ ID DM2 - on insulin, sig complications H/o osteomyelitis R foot - sp ray amp prior admission in Dec 2023 HTN - BP good, even low -  only on low dose coreg and I will add back PO lasix but not aldactone  Atrial fib Anemia-  iron low-  will give IV iron    Cecille Aver  07/25/2022, 12:54 PM  Recent Labs  Lab 07/24/22 0500 07/25/22 0500  HGB 10.1* 9.5*  ALBUMIN 1.9* 2.1*  CALCIUM 8.5* 8.4*  PHOS 3.0 3.6  CREATININE 1.24 1.06  K 4.2 3.8   Recent Labs  Lab 07/22/22 0306  IRON 16*  TIBC 199*  FERRITIN 393*   Inpatient medications:  apixaban  5 mg Oral BID   budesonide (PULMICORT) nebulizer solution  0.5 mg Nebulization BID   busPIRone  10 mg Oral BID   carvedilol  3.125 mg Oral BID   Chlorhexidine Gluconate Cloth  6 each Topical Daily   clopidogrel  75 mg Oral Q breakfast   digoxin  125 mcg Oral Daily   famotidine  20 mg Oral BID   insulin aspart  0-20 Units Subcutaneous TID WC   insulin glargine-yfgn  20 Units Subcutaneous Daily   lidocaine  1 patch Transdermal Daily   nystatin  5 mL Oral QID   polyethylene glycol  17 g Oral BID   senna-docusate  2 tablet Oral  BID   sodium chloride flush  10-40 mL Intracatheter Q12H    DAPTOmycin (CUBICIN) 650 mg in sodium chloride 0.9 % IVPB 650 mg (07/24/22 1903)   acetaminophen **OR** acetaminophen, Glycerin (Adult), HYDROmorphone (DILAUDID) injection, hydrOXYzine, ipratropium-albuterol, magic mouthwash w/lidocaine, methocarbamol, Muscle Rub, ondansetron **OR** ondansetron (ZOFRAN) IV, mouth rinse, oxyCODONE, sodium chloride flush, sorbitol, milk of mag, mineral oil, glycerin (SMOG) enema

## 2022-07-25 NOTE — Progress Notes (Signed)
Pt transported to [facility] at this time via [facility]. Alert and oriented x 4. Medicated for pain of 10/10.

## 2022-07-25 NOTE — Progress Notes (Signed)
[facility]  Date of Admission:  07/12/2022      Lines: 4/13-c rue picc  Abx: 4/12-15; 4/21-c dapto  4/15 oritavancin 4/11-12 vanc  ASSESSMENT: Mrsa bacteremia Acute back pain with mri suggesting paraspinal myositis Recurrent/chronic diabetic toe osteomyelitis  4/09 bcx mrsa (S tetra, daptomycin; R bactrim) 4/11 bcx negative  4/12 tee no vegetation  Patient has been still having low grade fever, and on 4/21 repeat bcx done, and ID reengaged 4/21 bcx in progress   Potential of picc infection but I doubt. Suspect all due to mrsa and concerned for progression of back involvement   PLAN: Given the mri finding myositis, I am concerned for occult spinal om with spillage into muscle, and so will plan to treat 8 weeks abx (and can transition iv daptomycin to oral doxycycline once no surgery needed any longer) With the chronic nature of the toe osteomyelitis I agree I&D or amputation would be needed for source control Continue daptomycin for now I do not feel adding gram negative coverage is needed Mri lumbar/thoracic spine ordered Per primary team, he might be transferring to [place] Discussed with primary team  I spent more than 50 minute reviewing data/chart, and coordinating care and >50% direct face to face time providing counseling/discussing diagnostics/treatment plan with patient   Principal Problem:   Acute respiratory failure with hypoxia Active Problems:   Essential hypertension   Insulin dependent type 2 diabetes mellitus   Atrial fibrillation with RVR   Sleep apnea suspected   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)   Pure hypercholesterolemia   Hyponatremia   Hypomagnesemia   Sepsis due to pneumonia   Bacteremia due to methicillin resistant Staphylococcus aureus   No Known Allergies  Scheduled Meds:  apixaban  5 mg Oral BID   budesonide (PULMICORT) nebulizer solution  0.5 mg Nebulization  BID   busPIRone  10 mg Oral BID   carvedilol  3.125 mg Oral BID   Chlorhexidine Gluconate Cloth  6 each Topical Daily   clopidogrel  75 mg Oral Q breakfast   digoxin  125 mcg Oral Daily   famotidine  20 mg Oral BID   insulin aspart  0-20 Units Subcutaneous TID WC   insulin glargine-yfgn  20 Units Subcutaneous Daily   lidocaine  1 patch Transdermal Daily   nystatin  5 mL Oral QID   polyethylene glycol  17 g Oral BID   senna-docusate  2 tablet Oral BID   sodium chloride flush  10-40 mL Intracatheter Q12H   Continuous Infusions:  DAPTOmycin (CUBICIN) 650 mg in sodium chloride 0.9 % IVPB 650 mg (07/24/22 1903)   PRN Meds:.acetaminophen **OR** acetaminophen, Glycerin (Adult), HYDROmorphone (DILAUDID) injection, hydrOXYzine, ipratropium-albuterol, magic mouthwash w/lidocaine, methocarbamol, Muscle Rub, ondansetron **OR** ondansetron (ZOFRAN) IV, mouth rinse, oxyCODONE, sodium chloride flush, sorbitol, milk of mag, mineral oil, glycerin (SMOG) enema   SUBJECTIVE: Patient with mrsa bsi and mri finding myositis thorolumbar area Last year has right foot mrsa om s/p resection 4th toe  Tee this admission negative Has picc placed after blood cx cleared  Ongoing thoracic spine tenderness  Team also concern for dusky looking right foot  Repeat mri right foot suggest imagine progression om of 4th toe  No diarrhea, cough, or other focal pain  Review of Systems: ROS All other ROS was negative, except mentioned above     OBJECTIVE: Vitals:   07/25/22 0231 07/25/22 0410 07/25/22 0500 07/25/22 0825  BP:  115/89    Pulse:  73    Resp:  20    Temp: 100.3 F (37.9 C) 98.9 F (37.2 C)    TempSrc: Oral Oral    SpO2:  96%  95%  Weight:   84.5 kg   Height:       Body mass index is 25.27 kg/m.  Physical Exam General/constitutional: moderate distress moving around in bed complaining of back pain HEENT: Normocephalic, PER, Conj Clear, EOMI, Oropharynx clear Neck supple CV: rrr no  mrg Lungs: clear to auscultation, normal respiratory effort Abd: Soft, Nontender Ext: no edema Skin: No Rash Neuro: nonfocal MSK: back tender midline and left paraspinal area mid thoracic; right foot dressing c/d/I without surrounding erythema, and mildly tender on squeezing of the forefoot   Central line presence: rue picc site no erythema/purulence   Lab Results Lab Results  Component Value Date   WBC 9.6 07/25/2022   HGB 9.5 (L) 07/25/2022   HCT 28.5 (L) 07/25/2022   MCV 91.3 07/25/2022   PLT 318 07/25/2022    Lab Results  Component Value Date   CREATININE 1.06 07/25/2022   BUN 14 07/25/2022   NA 126 (L) 07/25/2022   K 3.8 07/25/2022   CL 95 (L) 07/25/2022   CO2 23 07/25/2022    Lab Results  Component Value Date   ALT 31 07/25/2022   AST 25 07/25/2022   ALKPHOS 125 07/25/2022   BILITOT 0.8 07/25/2022      Microbiology: Recent Results (from the past 240 hour(s))  Culture, blood (Routine X 2) w Reflex to ID Panel     Status: None (Preliminary result)   Collection Time: 07/24/22  3:55 PM   Specimen: BLOOD  Result Value Ref Range Status   Specimen Description   Final    BLOOD BLOOD LEFT ARM Performed at [facility], [address] [place], [place] [address]    Special Requests   Final    AEROBIC BOTTLE ONLY Blood Culture adequate volume Performed at [facility], [address] [place], [place] [address]    Culture   Final    NO GROWTH < 12 HOURS Performed at [facility], [address] [place], [place] [address]    Report Status PENDING  Incomplete  Culture, blood (Routine X 2) w Reflex to ID Panel     Status: None (Preliminary result)   Collection Time: 07/24/22  3:55 PM   Specimen: BLOOD  Result Value Ref Range Status   Specimen Description   Final    BLOOD BLOOD RIGHT ARM Performed at [facility], [address] [place], [place] [address]    Special Requests   Final    AEROBIC  BOTTLE ONLY Blood Culture adequate volume Performed at [facility], [address] [place], [place] [address]    Culture   Final    NO GROWTH < 12 HOURS Performed at [facility], [address], [place], [place] [address]    Report Status PENDING  Incomplete     Serology:   Imaging: If present, new imagings (plain films, ct scans, and mri) have been personally visualized and interpreted; radiology reports have been reviewed. Decision making incorporated into the Impression / Recommendations.  4/21 mri right foot  1. Postsurgical changes from fifth ray resection to the level of the distal fifth metatarsal diaphysis. Acute osteomyelitis of the residual fifth metatarsal diaphysis extending to the level of the  proximal metaphysis. 2. Soft tissue wound or ulceration at the lateral aspect of the forefoot near the residual fifth metatarsal with enhancing sinus tract extending to the underlying resection margin. 3. Prior fourth ray resection at the level of the distal fourth metatarsal diaphysis without evidence of osteomyelitis. 4. Diffuse edema-like intramuscular signal which may be related to denervation or myositis.   4/10 mr lumbar and thoracic spine 1. Mild diffuse edema involving the lower posterior paraspinous musculature. Finding is nonspecific, and could be related to overall volume status. Changes of acute myositis, which could be either infectious or inflammatory in nature could also be considered. No loculated or drainable fluid collections. 2. No other MRI evidence for acute infection within the lumbar spine. No evidence for osteomyelitis discitis or septic arthritis. 3. Small left subarticular disc protrusion at L5-S1, contacting the descending left S1 nerve root. 4. Degenerative spondylosis at L3-4 and L4-5 with resultant mild bilateral L3 and L4 foraminal stenosis.    Raymondo Band, MD [facility] [phone] pager    07/25/2022, 10:51 AM

## 2022-07-25 NOTE — Progress Notes (Signed)
Called in regards to the thoracic spine mri which shows discitis osteomyelitis at T8-T9 with fluid collection anterior to the spine T7-T9 that looks like epidural phlegmon. There is no surgical intervention warranted for this as we cannot get anterior to the spine. Recommend medical management with IV abx.

## 2022-07-25 NOTE — Inpatient Diabetes Management (Signed)
Inpatient Diabetes Program Recommendations  AACE/ADA: New Consensus Statement on Inpatient Glycemic Control (2015)  Target Ranges:  Prepandial:   less than 140 mg/dL      Peak postprandial:   less than 180 mg/dL (1-2 hours)      Critically ill patients:  140 - 180 mg/dL   Lab Results  Component Value Date   GLUCAP 167 (H) 07/25/2022   HGBA1C 9.1 (H) 07/13/2022    Review of Glycemic Control  Latest Reference Range & Units 07/23/22 07:46 07/23/22 12:36 07/23/22 15:31 07/23/22 17:11 07/23/22 20:32 07/24/22 10:43 07/24/22 16:30 07/24/22 21:13 07/25/22 07:59  Glucose-Capillary 70 - 99 mg/dL 161 (H) 77 096 (H) 045 (H) 213 (H) 189 (H) 228 (H) 234 (H) 167 (H)   Diabetes history: DM  Outpatient Diabetes medications:  Basaglar 40 units daily, Humalog 15 units tid with meals  Current orders for Inpatient glycemic control:  Novolog 0-20 units  Semglee 20 units daily  Inpatient Diabetes Program Recommendations:    Consider reducing Novolog to moderate tid with meals and add Novolog meal coverage 3 units tid with meals (hold if patient eats less than 50% or NPO).   Thanks,  Beryl Meager, RN, BC-ADM Inpatient Diabetes Coordinator Pager [phone]  (8a-5p)

## 2022-07-25 NOTE — Progress Notes (Signed)
PROGRESS NOTE    David [name]  ZOX:[id number] DOB: 21-Jan-1961 DOA: 07/12/2022 PCP: [name], MD   Brief Narrative:  The patient is a 62 y.o. 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 His appetite has been decreased. He had a fever at home of 105 F. He was having significant chills and rigors.  His rigors were so intense that produced the above-mentioned back pain.  He was found to have recurrent MRSA bacteremia but given that no vegetations on his TEE ID recommended 2 weeks of total antibiotics but given his financial issues they have given him a dose of oritavancin now.  Sodium remains on the low side.   Nephrology was consulted and now they have started the patient on Lasix twice daily IV and also given 1 dose of tolvaptan 15 mg x 1 did not have much overt response to tolvaptan but sodium slowly came up and nephrology recommends continuing diuresis and current regimen today given his robust urine output.   **Interim History Patient continues to improve and shortness of breath as well as leg edema improving.  Sodium is improved to 130 yesterday but dropped to 124 today.  Nephrology has now stopped diuretics and repeating sodium every 6 hours.  They feel that he may have been over diuresed.  Patient continues complain of significant back pain and started getting low-grade temperatures.  Will check an ESR and CRP and after further evaluation the the right foot ulceration appears worse so we will get an MRI and repeat blood cultures.  Showed concern for acute osteomyelitis so we will consult  orthopedic surgery and reengage infectious diseases.  Infectious diseases has recommended resuming daptomycin.  Further workup was done given that patient continued to can still complain of significant back pain so a repeat MRI thoracic spine was done and showed "discitis osteomyelitis at T8-T9, with a loculated, peripherally enhancing fluid collection anterior to the T7-T9 vertebral bodies, concerning for a multiloculated prevertebral abscess. Enhancing material along the anterior aspect of the thecal sac posterior to T8 and T9, and likely along the right aspect of the thecal sac at T9, concerning for epidural phlegmon, which does not cause significant mass effect on the thecal sac.. Large right pleural effusion with a thick enhancing rim, concerning for empyema."  The patient is being transferred to [facility] for further surgical evaluation for his acute osteo of his foot but will also get neurosurgery evaluation and they saw the patient and did not feel that he has any neurosurgical needs or intervention.  Neurosurgery recommends getting IR involved.  Pulmonary was also consulted given the empyema and they also recommended getting IR involved.  IR evaluated management consult has been placed and awaiting evaluation.  ID recommends continuing daptomycin for now   Assessment and Plan:  Sepsis due to MRSA bacteremia in the setting of now acute right foot osteomyelitis, T8-T9 discitis and osteomyelitis with a loculated peripherally enhancing fluid collection anterior to the T7-T9 vertebral bodies concerning for a multiloculated prevertebral abscess as well as a T9 epidural phlegmon and a large right pleural effusion with a concern for an empyema -Blood cultures obtained on admission showing MRSA.   -He has a history of  MRSA bacteremia in 2023 felt to be due to diabetic toe infections.   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 [name].  -Due to severe back pain he  underwent imaging, MRI of the T and L-spine with nonspecific findings, no osteomyelitis or discitis.   -2D echo and TEE without vegetations.   -WBC Trend: Recent Labs  Lab 07/18/22 0221 07/20/22 1010 07/21/22 0413 07/22/22 0306 07/23/22 0240 07/24/22 0500 07/25/22 0500  WBC 14.7* 14.1* 10.8* 10.4 9.0 12.5* 9.6  -ID had initially total of 2 weeks of antibiotics but now will reengage given that he has an acute osteo as well as T8-T9 discitis and osteomyelitis with suspected for abscess.   -Checking ESR and a CRP and ESR was 87 and CRP was 15.1 -I consulted pulmonary for evaluation for an empyema and they recommended getting IR involved for catheter drainage and Dr. Judeth Horn will add him to the rounding list and patient may benefit from lytics and if legs do not work then pulmonary will recommend obtaining cardiothoracic surgery involvement -Neurosurgery consulted and feel that he does not need any neurosurgical intervention at this time. -Interventional radiology consulted for further evaluation recommendations and awaiting to see the patient   Right foot ulcer with acute osteomyelitis of the residual fifth metatarsal diaphysis extending to the level of the proximal metaphysis, worsening -At the site of prior ray amputation.  Patient initially reported as improving -Plain imaging without osteomyelitis, wound initially looked good without any evidence of deep infection however when I examined it today there was concern for deeper infection so I ordered an MRI -MRI done and showed "Postsurgical changes from fifth ray resection to the level of the distal fifth metatarsal diaphysis. 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 Soft tissue wound or ulceration at the lateral aspect of the forefoot near the residual fifth metatarsal with enhancing sinus tract extending to the underlying resection margin. Prior fourth ray resection at  the level of the distal fourth metatarsal diaphysis without evidence of osteomyelitis. Diffuse edema-like intramuscular signal which may be related to denervation or myositis." -Discussed the case with ID again who reevaluate the patient in the morning but have told me to start the patient on Daptomycin -Orthopedic surgery evaluating and recommending transfer to [facility] for further surgical intervention -After reinitiating antibiotics will obtain blood cultures again -Obtained WOC consult and he has palpable care recommendations and wound nurse recommending have the patient follow-up with outpatient wound care center and podiatric medicine at discharge   Severe Back pain  -Initial Lumbar MRI with diffuse edema involving the lower posterior paraspinous musculature, nonspecific, acute myositis possible infectious versus inflammatory.   -Initial MRI T-spine done and showed "No MRI evidence for acute infection within the thoracic spine. Small layering bilateral pleural effusions. Mild chronic compression deformities about the T2 through T6 as well as the T9 vertebral bodies. Underlying mild for age thoracic spondylosis without significant stenosis or neural impingement." -There wee no loculated or drainable fluid collections on initial imaging.  Negative for discitis/osteomyelitis.   -Given newly found Acute Osteo in his foot I spoke to ID who felt that it would be a good idea to re-image -Repeat Thoracic and Lumbar MRI Done and showed "Findings concerning for discitis osteomyelitis at T8-T9, with a loculated, peripherally enhancing fluid collection anterior to the T7-T9 vertebral bodies, concerning for a multiloculated prevertebral abscess. Enhancing material along the anterior aspect  of the thecal sac posterior to T8 and T9, and likely along the right aspect of the thecal sac at T9, concerning for epidural phlegmon, which does not cause significant mass effect on the thecal sac. Large right pleural  effusion with a thick enhancing rim, concerning for empyema. No evidence of discitis or osteomyelitis in the lumbar spine." -Case was discussed with neurosurgery Dr. Yetta Barre who feels that the anterior collection in the T7 and T9 spinal second epidural phlegmon and they did not feel that neurosurgical intervention was warranted at this time but they recommended IV antibiotics and IR to try and help drain given that he also had an empyema -Continue oxycodone, Flexeril made him loopy and he was switched to Robaxin: Also gets IV hydrocodone and increase the frequency given his continued back discomfort and pain -C/w bowel regimen    Hyponatremia  -Stable for most part but with sudden worsening of 121 on 4/15, after a bolus of fluids for hypotension (possibly induced by pain medications).   -He also tells me he drinks large amounts of water several pitchers per day.   -He was placed on water restriction, sodium slightly better, continue to monitor.   -Na+ Trend:  Recent Labs  Lab 07/21/22 0413 07/22/22 0306 07/23/22 0240 07/24/22 0500 07/24/22 1555 07/24/22 1700 07/25/22 0500  NA 123* 125* 130* 124* 128* 126* 126*  -Received a dose of tolvaptan and was getting diuresis and held yesterday but now Nephrology recommending po Lasix 40 mg po Daily.  -Baseline is around 128-130.  If he gets in the upper 120s could potentially go home but he continues to be volume overloaded continue diuresis for now -Appreciate nephrology assistance and help   Essential Hypertension  -Continue his Sherryll Burger was held due to Hypotension, Coreg dose decreased. -Continue to Monitor BP per Protocol  -Last blood pressure reading was on the softer side at 127/86   CKD 3A Metabolic Acidosis -Baseline creatinine 1.2-1.5 -BUN/Cr Trend: Recent Labs  Lab 07/19/22 0423 07/20/22 0445 07/21/22 0413 07/22/22 0306 07/23/22 0240 07/24/22 0500 07/25/22 0500  BUN 22* 25* 29* 24* 21* 15 14  CREATININE 1.13 1.42* 1.46* 1.23  1.21 1.24 1.06  -CO2 was 21, anion gap was 9, chloride level was 93 -Avoid Nephrotoxic Medications, Contrast Dyes, Hypotension and Dehydration to Ensure Adequate Renal Perfusion and will need to Renally Adjust Meds -Continue to Monitor and Trend Renal Function carefully and repeat CMP in the AM    Hypomagnesemia -Patient's Mag Level Trend: Recent Labs  Lab 07/17/22 0300 07/20/22 0445 07/21/22 0413 07/22/22 0306 07/23/22 0240 07/24/22 0500 07/25/22 0500  MG 2.0 1.9 1.9 1.9 2.1 1.7 1.7  -Continue to Monitor and Replete as Necessary -Repeat Mag in the AM    Hyperbilirubinemia -Bilirubin Trend: Recent Labs  Lab 07/17/22 0300 07/20/22 1010 07/21/22 0413 07/22/22 0306 07/23/22 0240 07/24/22 0500 07/25/22 0500  BILITOT 1.2 1.9* 1.3* 1.0 0.7 0.7 0.8  -Continue to Monitor and Trend and Repeat CMP in the AM   Atrial Fibrillation -Rate controlled, continue Eliquis, Coreg.   -Continue to Monitor on Telemetry  -Followed by cardiology as an outpatient   Acute on Chronic Combined CHF  -His EF has improved and now normalized.  Diuretics were discontinued due to his hyponatremia, continue fluid restriction, Entresto also held due to hypotension.   -Nephrology on diuresing the patient and now stopped -Strict I's and O's and Daily Weights  Intake/Output Summary (Last 24 hours) at 07/25/2022 2014 Last data filed at 07/25/2022 1900 Gross per  24 hour  Intake 900.14 ml  Output 2500 ml  Net -1599.86 ml  -Nephrology was consulted given his hyponatremia and he is was getting diuresis with IV Lasix 20 g twice daily but they have now stopped it -Will need cardiology follow-up closely charge   Hallucinations, improving  -Nursing staff states that the patient was hallucinating about bugs -Continue to monitor and placed on delirium precautions -May need head imaging if continues to persist but he seemed appropriate today   Normocytic Anemia -Hgb/Hct Trend Recent Labs  Lab 07/18/22 0221  07/20/22 1010 07/21/22 0413 07/22/22 0306 07/23/22 0240 07/24/22 0500 07/25/22 0500  HGB 12.2* 11.4* 10.0* 10.0* 10.1* 10.1* 9.5*  HCT 36.0* 33.1* 29.8* 30.0* 30.1* 30.5* 28.5*  MCV 92.3 90.9 91.4 91.5 92.0 92.1 91.3  -Check Anemia Panel and is now showing an iron level of 16, UIBC 183, TIBC 199, saturation ratios of 8%, ferritin level of 393, folate level of 13.9, vitamin B12 of 1066 -Continue to monitor for signs and symptoms of bleeding; no overt bleeding noted -Repeat CBC in a.m.   Hyperlipidemia  -Continue Atorvastatin 80 mg po Daily   IDDM, poorly controlled, with hyperglycemia -Continue Insulin Glargine 20 units sq Daily -C/w Resistant Novolog SSI AC -Continue to Monitor CBGs carefully; CBG Trend: Recent Labs  Lab 07/23/22 2032 07/24/22 1043 07/24/22 1630 07/24/22 2113 07/25/22 0759 07/25/22 1149 07/25/22 1620  GLUCAP 213* 189* 228* 234* 167* 214* 98    Hypoalbuminemia -Patient's Albumin Trend: Recent Labs  Lab 07/17/22 0300 07/20/22 1010 07/21/22 0413 07/22/22 0306 07/23/22 0240 07/24/22 0500 07/25/22 0500  ALBUMIN 2.4* 2.3* 2.1* 2.1* 2.2* 1.9* 2.1*  -Continue to Monitor and Trend and repeat CMP in the AM  DVT prophylaxis:  apixaban (ELIQUIS) tablet 5 mg    Code Status: Full Code Family Communication: No family currently at bedside  Disposition Plan:  Level of care: Telemetry Surgical Status is: Inpatient Remains inpatient appropriate because: Is for surgical intervention of his foot and interventional radiology evaluation further care per ID    Consultants:  Interventional radiology Infectious diseases Neurosurgery Nephrology Orthopedic Surgery Pulmonary has been notified  Procedures:  As delineated as above  Antimicrobials:  Anti-infectives (From admission, onward)    Start     Dose/Rate Route Frequency Ordered Stop   07/24/22 1845  DAPTOmycin (CUBICIN) 650 mg in sodium chloride 0.9 % IVPB        8 mg/kg  83.9 kg 126 mL/hr over 30  Minutes Intravenous Daily 07/24/22 1759     07/18/22 1500  Oritavancin Diphosphate (ORBACTIV) 1,200 mg in dextrose 5 % IVPB        1,200 mg 333.3 mL/hr over 180 Minutes Intravenous Once 07/18/22 1351 07/18/22 1822   07/16/22 0000  daptomycin (CUBICIN) IVPB  Status:  Discontinued        650 mg Intravenous Every 24 hours 07/16/22 1116 07/19/22    07/15/22 2000  DAPTOmycin (CUBICIN) 650 mg in sodium chloride 0.9 % IVPB  Status:  Discontinued        8 mg/kg  81 kg 126 mL/hr over 30 Minutes Intravenous Daily 07/15/22 1411 07/15/22 1413   07/15/22 2000  DAPTOmycin (CUBICIN) 650 mg in sodium chloride 0.9 % IVPB  Status:  Discontinued        8 mg/kg  81 kg 126 mL/hr over 30 Minutes Intravenous Daily 07/15/22 1413 07/18/22 1351   07/14/22 0200  vancomycin (VANCOREADY) IVPB 1750 mg/350 mL  Status:  Discontinued  1,750 mg 175 mL/hr over 120 Minutes Intravenous Every 24 hours 07/13/22 0718 07/15/22 1411   07/13/22 1000  cefTRIAXone (ROCEPHIN) 1 g in sodium chloride 0.9 % 100 mL IVPB  Status:  Discontinued        1 g 200 mL/hr over 30 Minutes Intravenous Every 24 hours 07/12/22 1405 07/13/22 0239   07/13/22 1000  azithromycin (ZITHROMAX) 500 mg in sodium chloride 0.9 % 250 mL IVPB  Status:  Discontinued        500 mg 250 mL/hr over 60 Minutes Intravenous Every 24 hours 07/12/22 1405 07/13/22 0239   07/13/22 0330  vancomycin (VANCOREADY) IVPB 1750 mg/350 mL        1,750 mg 175 mL/hr over 120 Minutes Intravenous  Once 07/13/22 0240 07/13/22 0517   07/12/22 0845  cefTRIAXone (ROCEPHIN) 1 g in sodium chloride 0.9 % 100 mL IVPB        1 g 200 mL/hr over 30 Minutes Intravenous  Once 07/12/22 0844 07/12/22 1218   07/12/22 0845  azithromycin (ZITHROMAX) 500 mg in sodium chloride 0.9 % 250 mL IVPB        500 mg 250 mL/hr over 60 Minutes Intravenous  Once 07/12/22 0844 07/12/22 1218        Subjective: Seen and examined at bedside and was still complaining some back pain.  No nausea or vomiting.   Also complained of some foot pain.  Frustrated with his health status and states that he will "need to go unwell for now".  Denies any other concerns or complaints at this time and just feels down about his situation  Objective: Vitals:   07/25/22 0500 07/25/22 0825 07/25/22 1152 07/25/22 1806  BP:   127/86   Pulse:   67   Resp:   16   Temp:   98 F (36.7 C) 99.1 F (37.3 C)  TempSrc:   Oral Oral  SpO2:  95% 98%   Weight: 84.5 kg     Height:        Intake/Output Summary (Last 24 hours) at 07/25/2022 2012 Last data filed at 07/25/2022 1900 Gross per 24 hour  Intake 900.14 ml  Output 2500 ml  Net -1599.86 ml   Filed Weights   07/23/22 0700 07/24/22 0500 07/25/22 0500  Weight: 84.4 kg 83.9 kg 84.5 kg   Examination: Physical Exam:  Constitutional: WN/WD overweight Caucasian male who is frustrated and complaining of back pain Respiratory: Diminished to auscultation bilaterally, no wheezing, rales, rhonchi or crackles. Normal respiratory effort and patient is not tachypenic. No accessory muscle use. Unlabored breathing  Cardiovascular: RRR, no murmurs / rubs / gallops. S1 and S2 auscultated. 1+ LE edema Abdomen: Soft, non-tender, distended 2/2 body habtius. Bowel sounds positive.  GU: Deferred. Musculoskeletal: No clubbing / cyanosis of digits/nails.Amputations noted Skin: Has ulcers on his feet. Multiple tattoos noted.  Neurologic: CN 2-12 grossly intact with no focal deficits.  Romberg sign and cerebellar reflexes not assessed.  Psychiatric: Normal judgment and insight. Alert and oriented x 3. Normal mood and appropriate affect.   Data Reviewed: I have personally reviewed following labs and imaging studies  CBC: Recent Labs  Lab 07/21/22 0413 07/22/22 0306 07/23/22 0240 07/24/22 0500 07/25/22 0500  WBC 10.8* 10.4 9.0 12.5* 9.6  NEUTROABS 7.9* 7.9* 6.4 9.6* 7.4  HGB 10.0* 10.0* 10.1* 10.1* 9.5*  HCT 29.8* 30.0* 30.1* 30.5* 28.5*  MCV 91.4 91.5 92.0 92.1 91.3  PLT 335  328 366 376 318   Basic Metabolic Panel: Recent Labs  Lab 07/21/22 0413 07/22/22 0306 07/23/22 0240 07/24/22 0500 07/24/22 1555 07/24/22 1700 07/25/22 0500  NA 123* 125* 130* 124* 128* 126* 126*  K 4.0 4.0 3.9 4.2  --   --  3.8  CL 94* 98 99 94*  --   --  95*  CO2 22 20* 24 23  --   --  23  GLUCOSE 243* 174* 206* 179*  --   --  209*  BUN 29* 24* 21* 15  --   --  14  CREATININE 1.46* 1.23 1.21 1.24  --   --  1.06  CALCIUM 8.1* 8.3* 8.5* 8.5*  --   --  8.4*  MG 1.9 1.9 2.1 1.7  --   --  1.7  PHOS 3.3 3.0 3.6 3.0  --   --  3.6   GFR: Estimated Creatinine Clearance: 81.3 mL/min (by C-G formula based on SCr of 1.06 mg/dL). Liver Function Tests: Recent Labs  Lab 07/21/22 0413 07/22/22 0306 07/23/22 0240 07/24/22 0500 07/25/22 0500  AST 22 25 26 25 25   ALT 27 29 31  32 31  ALKPHOS 114 130* 139* 140* 125  BILITOT 1.3* 1.0 0.7 0.7 0.8  PROT 6.1* 6.5 6.9 6.9 6.8  ALBUMIN 2.1* 2.1* 2.2* 1.9* 2.1*   No results for input(s): "LIPASE", "AMYLASE" in the last 168 hours. No results for input(s): "AMMONIA" in the last 168 hours. Coagulation Profile: No results for input(s): "INR", "PROTIME" in the last 168 hours. Cardiac Enzymes: No results for input(s): "CKTOTAL", "CKMB", "CKMBINDEX", "TROPONINI" in the last 168 hours. BNP (last 3 results) No results for input(s): "PROBNP" in the last 8760 hours. HbA1C: No results for input(s): "HGBA1C" in the last 72 hours. CBG: Recent Labs  Lab 07/24/22 1630 07/24/22 2113 07/25/22 0759 07/25/22 1149 07/25/22 1620  GLUCAP 228* 234* 167* 214* 98   Lipid Profile: No results for input(s): "CHOL", "HDL", "LDLCALC", "TRIG", "CHOLHDL", "LDLDIRECT" in the last 72 hours. Thyroid Function Tests: No results for input(s): "TSH", "T4TOTAL", "FREET4", "T3FREE", "THYROIDAB" in the last 72 hours. Anemia Panel: No results for input(s): "VITAMINB12", "FOLATE", "FERRITIN", "TIBC", "IRON", "RETICCTPCT" in the last 72 hours. Sepsis Labs: Recent Labs   Lab 07/25/22 0500  PROCALCITON 0.13  LATICACIDVEN 0.8    Recent Results (from the past 240 hour(s))  Culture, blood (Routine X 2) w Reflex to ID Panel     Status: None (Preliminary result)   Collection Time: 07/24/22  3:55 PM   Specimen: BLOOD  Result Value Ref Range Status   Specimen Description   Final    BLOOD BLOOD LEFT ARM Performed at [facility], [address] [place], [place] [address]    Special Requests   Final    AEROBIC BOTTLE ONLY Blood Culture adequate volume Performed at [facility], [address] [place], [place] [address]    Culture   Final    NO GROWTH < 12 HOURS Performed at [facility], [address] [place], [place] [address]    Report Status PENDING  Incomplete  Culture, blood (Routine X 2) w Reflex to ID Panel     Status: None (Preliminary result)   Collection Time: 07/24/22  3:55 PM   Specimen: BLOOD  Result Value Ref Range Status   Specimen Description   Final    BLOOD BLOOD RIGHT ARM Performed at [facility], [address], [place], [place] [address]    Special Requests   Final    AEROBIC  BOTTLE ONLY Blood Culture adequate volume Performed at [facility], [address], [place], [place] [address]    Culture  Setup Time   Final    GRAM POSITIVE COCCI IN CLUSTERS AEROBIC BOTTLE ONLY CRITICAL VALUE NOTED.  VALUE IS CONSISTENT WITH PREVIOUSLY REPORTED AND CALLED VALUE. CRITICAL RESULT CALLED TO, READ BACK BY AND VERIFIED WITH: PHARMD M.BELL AT 1315 ON 07/25/2022 BY T.SAAD. Performed at [facility], [address] [place], [place] [address]    Culture GRAM POSITIVE COCCI  Final   Report Status PENDING  Incomplete     Radiology Studies: MR THORACIC SPINE W WO CONTRAST  Result Date: 07/25/2022 CLINICAL DATA:  Follow-up MR for MRSA bacteremia and concern for seeding EXAM: MRI THORACIC AND LUMBAR SPINE WITHOUT AND WITH CONTRAST TECHNIQUE:  Multiplanar and multiecho pulse sequences of the thoracic and lumbar spine were obtained without and with intravenous contrast. CONTRAST:  8mL GADAVIST GADOBUTROL 1 MMOL/ML IV SOLN COMPARISON:  07/13/2022 MRI thoracic and lumbar spine FINDINGS: MRI THORACIC SPINE FINDINGS Alignment: Mild scoliosis and exaggeration of the normal thoracic kyphosis. No significant listhesis. Vertebrae: Decreased T1 signal, increased T2 signal, and contrast enhancement throughout the T8 and T11 vertebral bodies, with increased fluid signal and enhancement in the T8-T9 disc space (series 21, image 12 and series 25, image 12), which is new from the prior exam. No acute fracture or suspicious osseous lesion. Cord: T1 hypointense, contrast enhancing material along the anterior aspect of the thecal sac posterior to T8 and T9 (series 23 and 26, images 24-27), most likely phlegmon. There is likely phlegmon along the right aspect of the thecal sac posterior to T9 (series 25, image 11), although this area is not covered well on the axial sequences. This does not cause significant mass effect on the thecal sac. Prominent epidural fat also does not cause significant mass effect on the thecal sac. No abnormal spinal cord enhancement. Paraspinal and other soft tissues: Anterior to the T7-T9 vertebral bodies, there is a loculated, peripherally enhancing fluid collection, which measures up to 3.1 x 5.9 x 5.9 cm (AP x TR x CC) (series 26, image 23 and 24; series 25, image 5), concerning for multiloculated prevertebral abscess. In addition, there is a large right pleural effusion with a thick enhancing rim, concerning for empyema (series 26, image 23). Disc levels: Mild degenerative changes, without significant spinal canal stenosis or neural foraminal narrowing. MRI LUMBAR SPINE FINDINGS Segmentation:  5 lumbar type vertebral bodies. Alignment:  Mild levoscoliosis.  No significant listhesis. Vertebrae: No acute fracture, evidence of discitis,  suspicious osseous lesion, or abnormal osseous enhancement. Conus medullaris: Extends to the L1 level and appears normal. No evidence of epidural collection. No abnormal enhancement. Paraspinal and other soft tissues: No acute finding. Redemonstrated increased T2 signal in the paraspinous musculature (series 2, images 3 and 9), without abnormal in his is no focal fluid collection in the paraspinous muscles. Disc levels: Degenerative changes are stable from the 07/13/2022 exam. Please see that report for detailed findings. IMPRESSION: 1. Findings concerning for discitis osteomyelitis at T8-T9, with a loculated, peripherally enhancing fluid collection anterior to the T7-T9 vertebral bodies, concerning for a multiloculated prevertebral abscess. 2. Enhancing material along the anterior aspect of the thecal sac posterior to T8 and T9, and likely along the right aspect of the thecal sac at T9, concerning for epidural phlegmon, which does not cause significant mass effect on the thecal sac. 3. Large right pleural effusion with a thick enhancing  rim, concerning for empyema. 4. No evidence of discitis or osteomyelitis in the lumbar spine. These results will be called to the ordering clinician or representative by the Radiologist Assistant, and communication documented in the PACS or [employer]. Electronically Signed   By: Wiliam Ke M.D.   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 CONTRAST:  8mL GADAVIST GADOBUTROL 1 MMOL/ML IV SOLN COMPARISON:  07/13/2022 MRI thoracic and lumbar spine FINDINGS: MRI THORACIC SPINE FINDINGS Alignment: Mild scoliosis and exaggeration of the normal thoracic kyphosis. No  significant listhesis. Vertebrae: Decreased T1 signal, increased T2 signal, and contrast enhancement throughout the T8 and T11 vertebral bodies, with increased fluid signal and enhancement in the T8-T9 disc space (series 21, image 12 and series 25, image 12), which is new from the prior exam. No acute fracture or suspicious osseous lesion. Cord: T1 hypointense, contrast enhancing material along the anterior aspect of the thecal sac posterior to T8 and T9 (series 23 and 26, images 24-27), most likely phlegmon. There is likely phlegmon along the right aspect of the thecal sac posterior to T9 (series 25, image 11), although this area is not covered well on the axial sequences. This does not cause significant mass effect on the thecal sac. Prominent epidural fat also does not cause significant mass effect on the thecal sac. No abnormal spinal cord enhancement. Paraspinal and other soft tissues: Anterior to the T7-T9 vertebral bodies, there is a loculated, peripherally enhancing fluid collection, which measures up to 3.1 x 5.9 x 5.9 cm (AP x TR x CC) (series 26, image 23 and 24; series 25, image 5), concerning for multiloculated prevertebral abscess. In addition, there is a large right pleural effusion with a thick enhancing rim, concerning for empyema (series 26, image 23). Disc levels: Mild degenerative changes, without significant spinal canal stenosis or neural foraminal narrowing. MRI LUMBAR SPINE FINDINGS Segmentation:  5 lumbar type vertebral bodies. Alignment:  Mild levoscoliosis.  No significant listhesis. Vertebrae: No acute fracture, evidence of discitis, suspicious osseous lesion, or abnormal osseous enhancement. Conus medullaris: Extends to the L1 level and appears normal. No evidence of epidural collection. No abnormal enhancement. Paraspinal and other soft tissues: No acute finding. Redemonstrated increased T2 signal in the paraspinous musculature (series 2, images 3 and 9), without abnormal in his is no  focal fluid collection in the paraspinous muscles. Disc levels: Degenerative changes are stable from the 07/13/2022 exam. Please see that report for detailed findings. IMPRESSION: 1. Findings concerning for discitis osteomyelitis at T8-T9, with a loculated, peripherally enhancing fluid collection anterior to the T7-T9 vertebral bodies, concerning for a multiloculated prevertebral abscess. 2. Enhancing material along the anterior aspect of the thecal sac posterior to T8 and T9, and likely along the right aspect of the thecal sac at T9, concerning for epidural phlegmon, which does not cause significant mass effect on the thecal sac. 3. Large right pleural effusion with a thick enhancing rim, concerning for empyema. 4. No evidence of discitis or osteomyelitis in the lumbar spine. These results will be called to the ordering clinician or representative by the Radiologist Assistant, and communication documented in the PACS or [employer]. Electronically Signed   By: Wiliam Ke M.D.   On:  07/25/2022 16:55   MR FOOT RIGHT W WO CONTRAST  Result Date: 07/24/2022 CLINICAL DATA:  Diabetic foot wound EXAM: MRI OF THE RIGHT FOREFOOT WITHOUT AND WITH CONTRAST TECHNIQUE: Multiplanar, multisequence MR imaging of the right forefoot was performed before and after the administration of intravenous contrast. CONTRAST:  8mL GADAVIST GADOBUTROL 1 MMOL/ML IV SOLN COMPARISON:  X-ray 07/13/2022 FINDINGS: Bones/Joint/Cartilage Postsurgical changes from fifth ray resection at the level of the distal fifth metatarsal diaphysis. There is intense bone marrow edema and enhancement of the residual fifth metatarsal diaphysis extending to the level of the proximal metaphysis (series 6, image 15). Confluent low T1 signal changes near the resection margin are compatible with acute osteomyelitis. Prior fourth ray resection at the level of the distal fourth metatarsal diaphysis without evidence of osteomyelitis. Remaining osseous structures  are intact. No additional sites of osteomyelitis. No fracture or dislocation. Ligaments Intact Lisfranc ligament. Remaining collateral ligaments are intact. Muscles and Tendons Amputation changes to the musculotendinous structures at the lateral forefoot. Diffuse edema-like intramuscular signal which may be related to denervation or myositis. No tenosynovitis. Soft tissues Soft tissue wound or ulceration at the lateral aspect of the forefoot near the residual fifth metatarsal with enhancing sinus tract extending to the underlying resection margin (series 9, images 10-13). Subcutaneous edema of the dorsal forefoot. No organized or drainable fluid collections. IMPRESSION: 1. Postsurgical changes from fifth ray resection to the level of the distal fifth metatarsal diaphysis. 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 2. Soft tissue wound or ulceration at the lateral aspect of the forefoot near the residual fifth metatarsal with enhancing sinus tract extending to the underlying resection margin. 3. Prior fourth ray resection at the level of the distal fourth metatarsal diaphysis without evidence of osteomyelitis. 4. Diffuse edema-like intramuscular signal which may be related to denervation or myositis. Electronically Signed   By: Duanne Guess D.O.   On: 07/24/2022 15:58   DG Chest 2 View  Result Date: 07/24/2022 CLINICAL DATA:  Follow-up. Worsening dyspnea. Mild productive cough EXAM: CHEST - 2 VIEW COMPARISON:  07/20/2022 x-ray FINDINGS: Stable right-sided PICC. Persistent moderate right effusion and adjacent opacities. Increasing opacity left lung base. No pneumothorax. Stable cardiopericardial silhouette. Increasing vascular congestion. Tortuous aorta. Dilated stomach with air-fluid level seen beneath the left hemidiaphragm. Please correlate with any symptoms IMPRESSION: Increasing pleural effusion on the right and bilateral lung base  opacities. Increasing vascular congestion Dilated stomach with an air-fluid level beneath the left hemidiaphragm. Please correlate with any symptoms and additional workup as clinically appropriate Electronically Signed   By: Karen Kays M.D.   On: 07/24/2022 15:36    Scheduled Meds:  apixaban  5 mg Oral BID   budesonide (PULMICORT) nebulizer solution  0.5 mg Nebulization BID   busPIRone  10 mg Oral BID   carvedilol  3.125 mg Oral BID   Chlorhexidine Gluconate Cloth  6 each Topical Daily   clopidogrel  75 mg Oral Q breakfast   digoxin  125 mcg Oral Daily   famotidine  20 mg Oral BID   furosemide  40 mg Oral Daily   insulin aspart  0-20 Units Subcutaneous TID WC   insulin glargine-yfgn  20 Units Subcutaneous Daily   lidocaine  1 patch Transdermal Daily   nystatin  5 mL Oral QID   polyethylene glycol  17 g Oral BID   senna-docusate  2 tablet Oral BID   sodium chloride flush  10-40 mL  Intracatheter Q12H   Continuous Infusions:  DAPTOmycin (CUBICIN) 650 mg in sodium chloride 0.9 % IVPB 650 mg (07/24/22 1903)   ferric gluconate (FERRLECIT) IVPB 250 mg (07/25/22 1555)    LOS: 13 days   Marguerita Merles, DO [facility] Available via Epic secure chat 7am-7pm After these hours, please refer to coverage provider listed on [email or web address] 07/25/2022, 8:12 PM

## 2022-07-25 NOTE — Progress Notes (Signed)
OT Cancellation Note  Patient Details Name: Luchiano Viscomi MRN: [id number] DOB: 12-21-1960   Cancelled Treatment:    Reason Eval/Treat Not Completed: OT screened, no needs identified, will sign off. Screen via chart review and pt interview. Pt reports that he plan to move in with family members for increased support and requires no DME. PT reports Mod I with ADLs and agreed to no OT needs.   Theodoro Clock 07/25/2022, 12:02 PM

## 2022-07-25 NOTE — Consult Note (Signed)
Reason for Consult:thoracic spine infection  Referring Physician: triad hospitalist  Veto Macqueen is an 62 y.o. male.   HPI:  62 year old male presented to the hospital 2 weeks ago with fever of unknown origin. He has had a complicated infection in his right foot over the last several months. 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 He reports thoracic spine pain but no pain or weakness in his legs. Denies any bladder or bowel changes. Reports his pain 10/10. Able to ambulate on his own with no problem.   Past Medical History:  Diagnosis Date   Cardiomyopathy    a. EF 45% in 2019.   CHF (congestive heart failure)    Chronic anticoagulation 09/30/2017   Diabetes mellitus type 2 in nonobese    Does not have health insurance    Essential hypertension 09/26/2017   Financial difficulties    H/O noncompliance with medical treatment, presenting hazards to health    Non-insulin treated type 2 diabetes mellitus 09/26/2017   Persistent atrial fibrillation    Sleep apnea suspected 09/30/2017    Past Surgical History:  Procedure Laterality Date   ABDOMINAL AORTOGRAM W/LOWER EXTREMITY N/A 09/02/2021   Procedure: ABDOMINAL AORTOGRAM W/LOWER EXTREMITY;  Surgeon: Nada Libman, MD;  Location: [facility];  Service: Cardiovascular;  Laterality: N/A;   ABDOMINAL AORTOGRAM W/LOWER EXTREMITY N/A 12/28/2021   Procedure: ABDOMINAL AORTOGRAM W/LOWER EXTREMITY;  Surgeon: Nada Libman, MD;  Location: [facility];  Service: Cardiovascular;  Laterality: N/A;   AMPUTATION Right 09/04/2021   Procedure: 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;  Surgeon: Nadara Mustard, MD;  Location: [facility];  Service: Orthopedics;  Laterality: Right;   APPENDECTOMY  1971   CARDIOVERSION N/A 09/28/2017   Procedure: CARDIOVERSION;  Surgeon: Thurmon Fair, MD;  Location: [facility]OS[facility];  Service: Cardiovascular;  Laterality: N/A;   PERIPHERAL VASCULAR BALLOON ANGIOPLASTY  09/02/2021   Procedure: PERIPHERAL VASCULAR BALLOON  ANGIOPLASTY;  Surgeon: Nada Libman, MD;  Location: [facility];  Service: Cardiovascular;;   PERIPHERAL VASCULAR BALLOON ANGIOPLASTY  12/28/2021   Procedure: PERIPHERAL VASCULAR BALLOON ANGIOPLASTY;  Surgeon: Nada Libman, MD;  Location: [facility];  Service: Cardiovascular;;  Posterior Tibial PTA only   TEE WITHOUT CARDIOVERSION N/A 09/28/2017   Procedure: TRANSESOPHAGEAL ECHOCARDIOGRAM (TEE);  Surgeon: Thurmon Fair, MD;  Location: [facility] ENDOSCOPY;  Service: Cardiovascular;  Laterality: N/A;   TEE WITHOUT CARDIOVERSION N/A 09/03/2021   Procedure: TRANSESOPHAGEAL ECHOCARDIOGRAM (TEE);  Surgeon: Chilton Si, MD;  Location: [facility] ENDOSCOPY;  Service: Cardiovascular;  Laterality: N/A;   TEE WITHOUT CARDIOVERSION N/A 07/15/2022   Procedure: TRANSESOPHAGEAL ECHOCARDIOGRAM;  Surgeon: Thurmon Fair, MD;  Location: [facility];  Service: Cardiovascular;  Laterality: N/A;    No Known Allergies  Social History   Tobacco Use   Smoking status: Former    Years: 15    Types: Cigarettes    Quit date: 03/18/2018    Years since quitting: 4.3   Smokeless tobacco: Never   Tobacco comments:    09/26/2017 "2-3 cigarettes/month now"  Substance Use Topics   Alcohol use: Not Currently    Alcohol/week: 3.0 standard drinks of alcohol    Types: 3 Cans of beer per week    Comment: States he quit drinking 8-9 months ago    Family History  Problem Relation Age of Onset   Hypertension Mother      Review of Systems  Positive ROS: as above  All other systems have  been reviewed and were otherwise negative with the exception of those mentioned in the HPI and as above.  Objective: Vital signs in last 24 hours: Temp:  [98 F (36.7 C)-100.3 F (37.9 C)] 99.1 F (37.3 C) (04/22 1806) Pulse Rate:  [67-96] 67 (04/22 1152) Resp:  [16-20] 16 (04/22 1152) BP: (115-127)/(79-89) 127/86 (04/22 1152) SpO2:  [95 %-98 %] 98 % (04/22 1152) Weight:  [84.5 kg] 84.5 kg (04/22  0500)  General Appearance: Alert, cooperative, no distress, appears stated age Head: Normocephalic, without obvious abnormality, atraumatic Eyes: PERRL, conjunctiva/corneas clear, EOM's intact, fundi benign, both eyes      Lungs: , respirations unlabored Heart: Regular rate and rhythm Abdomen: Soft, non-tender, bowel sounds active all four quadrants, no masses, no organomegaly Extremities: right foot wound Pulses: 2+ and symmetric all extremities Skin: Skin color, texture, turgor normal, no rashes or lesions  NEUROLOGIC:   Mental status: A&O x4, no aphasia, good attention span, Memory and fund of knowledge Motor Exam - grossly normal, normal tone and bulk Sensory Exam - grossly normal Reflexes: symmetric, no pathologic reflexes, No Hoffman's, No clonus Coordination - grossly normal Gait - not tested Balance - not tested Cranial Nerves: I: smell Not tested  II: visual acuity  OS: na    OD: na  II: visual fields Full to confrontation  II: pupils Equal, round, reactive to light  III,VII: ptosis None  III,IV,VI: extraocular muscles  Full ROM  V: mastication   V: facial light touch sensation    V,VII: corneal reflex    VII: facial muscle function - upper    VII: facial muscle function - lower   VIII: hearing   IX: soft palate elevation    IX,X: gag reflex   XI: trapezius strength    XI: sternocleidomastoid strength   XI: neck flexion strength    XII: tongue strength      Data Review Lab Results  Component Value Date   WBC 9.6 07/25/2022   HGB 9.5 (L) 07/25/2022   HCT 28.5 (L) 07/25/2022   MCV 91.3 07/25/2022   PLT 318 07/25/2022   Lab Results  Component Value Date   NA 126 (L) 07/25/2022   K 3.8 07/25/2022   CL 95 (L) 07/25/2022   CO2 23 07/25/2022   BUN 14 07/25/2022   CREATININE 1.06 07/25/2022   GLUCOSE 209 (H) 07/25/2022   Lab Results  Component Value Date   INR 1.2 08/30/2021    Radiology: MR Lumbar Spine W Wo Contrast  Result Date:  07/14/2022 CLINICAL DATA:  Initial evaluation for MRSA bacteremia, back pain, concern for infection. EXAM: MRI LUMBAR SPINE WITHOUT AND WITH CONTRAST TECHNIQUE: Multiplanar and multiecho pulse sequences of the lumbar spine were obtained without and with intravenous contrast. CONTRAST:  8mL GADAVIST GADOBUTROL 1 MMOL/ML IV SOLN COMPARISON:  None Available. FINDINGS: Segmentation: Standard. Lowest well-formed disc space labeled the L5-S1 level. Alignment: Mild levoscoliosis. Alignment otherwise normal with preservation of the normal lumbar lordosis. No significant listhesis. Vertebrae: Mild chronic compression deformity noted at the superior endplate of L4 without retropulsion. Vertebral body height otherwise maintained with no other acute or recent fracture. Bone marrow signal intensity heterogeneous with a few scattered benign hemangiomata. No worrisome osseous lesions. No evidence for osteomyelitis discitis or septic arthritis. Conus medullaris and cauda equina: Conus extends to the T12 level. Conus and cauda equina appear normal. No epidural abscess or other collections. No abnormal enhancement. Paraspinal and other soft tissues: Mild diffuse edema seen throughout the lower posterior  paraspinous musculature (series 6, images 14, 6). Finding is nonspecific. No loculated or drainable fluid collections. Paraspinous soft tissues demonstrate no other acute finding. Disc levels: L1-2:  Unremarkable. L2-3: Disc desiccation without significant disc bulge. Mild facet spurring. No canal or foraminal stenosis. L3-4: Disc desiccation with mild diffuse disc bulge. Superimposed small biforaminal to extraforaminal disc protrusions closely approximate the exiting left L3 nerve roots. Mild bilateral facet hypertrophy. Resultant mild canal with right worse than left lateral recess stenosis. Mild bilateral L3 foraminal narrowing. L4-5: Disc desiccation with mild disc bulge. Mild bilateral facet hypertrophy with associated trace  joint effusions. No significant spinal stenosis. Mild bilateral L4 foraminal narrowing. L5-S1: Degenerative intervertebral disc space narrowing with disc desiccation and diffuse disc bulge. Reactive endplate spurring. Superimposed left subarticular disc protrusion with slight inferior migration. Protruding disc contacts the descending left S1 nerve root as it courses of the left lateral recess (series 8, image 32). Resultant mild narrowing of the left lateral recess. Central canal remains patent. Mild left L5 foraminal narrowing. IMPRESSION: 1. Mild diffuse edema involving the lower posterior paraspinous musculature. Finding is nonspecific, and could be related to overall volume status. Changes of acute myositis, which could be either infectious or inflammatory in nature could also be considered. No loculated or drainable fluid collections. 2. No other MRI evidence for acute infection within the lumbar spine. No evidence for osteomyelitis discitis or septic arthritis. 3. Small left subarticular disc protrusion at L5-S1, contacting the descending left S1 nerve root. 4. Degenerative spondylosis at L3-4 and L4-5 with resultant mild bilateral L3 and L4 foraminal stenosis. Electronically Signed   By: Rise Mu M.D.   On: 07/14/2022 04:34   MR THORACIC SPINE W WO CONTRAST  Result Date: 07/14/2022 CLINICAL DATA:  Initial evaluation for MRSA bacteremia, concern for infection. EXAM: MRI THORACIC WITHOUT AND WITH CONTRAST TECHNIQUE: Multiplanar and multiecho pulse sequences of the thoracic spine were obtained without and with intravenous contrast. CONTRAST:  8mL GADAVIST GADOBUTROL 1 MMOL/ML IV SOLN COMPARISON:  None Available. FINDINGS: Alignment: Mild dextroscoliosis with exaggeration of the normal thoracic kyphosis. No listhesis. Vertebrae: Mild chronic compression deformities noted about the T2 through T6 as well as the T9 vertebral bodies. No significant retropulsion. No acute or recent fracture. Bone  marrow signal intensity within normal limits. Atypical hemangioma noted largely replacing the T8 vertebral body. No worrisome osseous lesions. No evidence for osteomyelitis discitis or septic arthritis. Cord: Normal signal and morphology. No epidural abscess or other collection. No abnormal enhancement. Paraspinal and other soft tissues: No paraspinous inflammatory changes or collections. Small layering bilateral pleural effusions noted. Disc levels: T2-3: Degenerative disc bulging flattens and partially effaces the ventral thecal sac. No significant spinal stenosis. Foramina remain patent. T7-8: Tiny central disc protrusion indents the ventral thecal sac. Associated annular fissure. No significant canal or foraminal stenosis. T11-12: Small central disc protrusion with slight superior migration. No significant spinal stenosis. Foramina remain patent. Otherwise, minor for age spondylosis seen elsewhere within the thoracic spine. No other significant disc bulge or focal disc herniation. No significant spinal stenosis. Foramina remain patent. IMPRESSION: 1. No MRI evidence for acute infection within the thoracic spine. 2. Small layering bilateral pleural effusions. 3. Mild chronic compression deformities about the T2 through T6 as well as the T9 vertebral bodies. 4. Underlying mild for age thoracic spondylosis without significant stenosis or neural impingement. Electronically Signed   By: Rise Mu M.D.   On: 07/14/2022 04:24   DG Foot 2 Views Right  Result Date:  07/13/2022 CLINICAL DATA:  Right foot osteomyelitis EXAM: RIGHT FOOT - 2 VIEW COMPARISON:  Right foot x-ray 03/08/2022 FINDINGS: There has been interval amputation of the fifth phalanx and distal metatarsal. Old changes of distal fourth metatarsal and phalanx amputation are again noted. There is no acute fracture. No focal osseous lesion or cortical erosion. Soft tissues are within normal limits. Plantar calcaneal spur is present. IMPRESSION:  Interval amputation of the fifth phalanx and distal metatarsal. No radiographic evidence of osteomyelitis. Electronically Signed   By: Darliss Cheney M.D.   On: 07/13/2022 17:52   ECHOCARDIOGRAM COMPLETE  Result Date: 07/13/2022    ECHOCARDIOGRAM REPORT   Patient Name:   David [name] Date of Exam: 07/13/2022 Medical Rec #:  [id number]   Height:       72.0 in Accession #:    [id number]  Weight:       178.6 lb Date of Birth:  07/07/1960    BSA:          2.030 m Patient Age:    61 years    BP:           117/81 mmHg Patient Gender: M           HR:           81 bpm. Exam Location:  Inpatient Procedure: 2D Echo, Color Doppler and Cardiac Doppler Indications:    Bacteremia  History:        Patient has prior history of Echocardiogram examinations, most                 recent 09/03/2021. Arrythmias:Atrial Fibrillation; Risk                 Factors:Hypertension, Dyslipidemia and Current Smoker. OSA,                 Sepsis.  Sonographer:    Milbert Coulter Referring Phys: [id number] Daylene Katayama [facility]  Sonographer Comments: OSA. IMPRESSIONS  1. Left ventricular ejection fraction, by estimation, is 60 to 65%. The left ventricle has normal function. The left ventricle has no regional wall motion abnormalities. Left ventricular diastolic function could not be evaluated.  2. Right ventricular systolic function is normal. The right ventricular size is normal.  3. Left atrial size was mildly dilated.  4. Right atrial size was mildly dilated.  5. The mitral valve is normal in structure. Trivial mitral valve regurgitation. No evidence of mitral stenosis.  6. The aortic valve is normal in structure. Aortic valve regurgitation is not visualized. No aortic stenosis is present.  7. The inferior vena cava is normal in size with greater than 50% respiratory variability, suggesting right atrial pressure of 3 mmHg. FINDINGS  Left Ventricle: Left ventricular ejection fraction, by estimation, is 60 to 65%. The left ventricle has normal function. The left  ventricle has no regional wall motion abnormalities. The left ventricular internal cavity size was normal in size. There is  no left ventricular hypertrophy. Left ventricular diastolic function could not be evaluated due to atrial fibrillation. Left ventricular diastolic function could not be evaluated. Right Ventricle: The right ventricular size is normal. Right ventricular systolic function is normal. Left Atrium: Left atrial size was mildly dilated. Right Atrium: Right atrial size was mildly dilated. Pericardium: There is no evidence of pericardial effusion. Mitral Valve: The mitral valve is normal in structure. Mild mitral annular calcification. Trivial mitral valve regurgitation. No evidence of mitral valve stenosis. Tricuspid Valve: The tricuspid valve is normal in structure. Tricuspid valve  regurgitation is trivial. No evidence of tricuspid stenosis. Aortic Valve: The aortic valve is normal in structure. Aortic valve regurgitation is not visualized. No aortic stenosis is present. Pulmonic Valve: The pulmonic valve was normal in structure. Pulmonic valve regurgitation is trivial. No evidence of pulmonic stenosis. Aorta: The aortic root is normal in size and structure. Venous: The inferior vena cava is normal in size with greater than 50% respiratory variability, suggesting right atrial pressure of 3 mmHg. IAS/Shunts: No atrial level shunt detected by color flow Doppler.  LEFT VENTRICLE PLAX 2D LVIDd:         4.00 cm     Diastology LVIDs:         2.60 cm     LV e' medial:    9.57 cm/s LV PW:         1.10 cm     LV E/e' medial:  10.0 LV IVS:        1.00 cm     LV e' lateral:   16.33 cm/s LVOT diam:     1.90 cm     LV E/e' lateral: 5.8 LVOT Area:     2.84 cm  LV Volumes (MOD) LV vol d, MOD A2C: 44.9 ml LV vol d, MOD A4C: 59.5 ml LV vol s, MOD A2C: 12.6 ml LV vol s, MOD A4C: 16.7 ml LV SV MOD A2C:     32.3 ml LV SV MOD A4C:     59.5 ml LV SV MOD BP:      38.0 ml RIGHT VENTRICLE RV Basal diam:  3.60 cm RV Mid  diam:    3.60 cm RV S prime:     10.60 cm/s TAPSE (M-mode): 1.8 cm LEFT ATRIUM             Index        RIGHT ATRIUM           Index LA diam:        4.30 cm 2.12 cm/m   RA Area:     18.20 cm LA Vol (A2C):   81.8 ml 40.29 ml/m  RA Volume:   49.10 ml  24.18 ml/m LA Vol (A4C):   68.2 ml 33.59 ml/m LA Biplane Vol: 75.9 ml 37.38 ml/m  MITRAL VALVE MV Area (PHT): 4.06 cm    SHUNTS MV Decel Time: 187 msec    Systemic Diam: 1.90 cm MV E velocity: 95.50 cm/s Olga Millers MD Electronically signed by Olga Millers MD Signature Date/Time: 07/13/2022/9:39:24 AM    Final    CT ABDOMEN PELVIS W CONTRAST  Result Date: 07/12/2022 CLINICAL DATA:  Abdominal pain EXAM: CT ABDOMEN AND PELVIS WITH CONTRAST TECHNIQUE: Multidetector CT imaging of the abdomen and pelvis was performed using the standard protocol following bolus administration of intravenous contrast. RADIATION DOSE REDUCTION: This exam was performed according to the departmental dose-optimization program which includes automated exposure control, adjustment of the mA and/or kV according to patient size and/or use of iterative reconstruction technique. CONTRAST:  OMNIPAQUE IOHEXOL 350 MG/ML SOLN COMPARISON:  08/29/2021 FINDINGS: Lower chest: Please see separately reported examination of the abdomen and pelvis. Hepatobiliary: No solid liver abnormality is seen. No gallstones, gallbladder wall thickening, or biliary dilatation. Pancreas: Unremarkable. No pancreatic ductal dilatation or surrounding inflammatory changes. Spleen: Normal in size without significant abnormality. Adrenals/Urinary Tract: Adrenal glands are unremarkable. Small nonobstructive left renal calculi (series 12, image 42). No right-sided calculi, ureteral calculi, or hydronephrosis. Bladder is unremarkable. Stomach/Bowel: Stomach is within normal limits. Circumferential wall  thickening and mucosal hyperenhancement of the descending duodenum (series 12, image 32). Appendix is not clearly  visualized. Moderate burden of stool and stool balls throughout the colon Vascular/Lymphatic: Aortic atherosclerosis. No enlarged abdominal or pelvic lymph nodes. Reproductive: No mass or other significant abnormality. Other: No abdominal wall hernia or abnormality. No ascites. Musculoskeletal: No acute or significant osseous findings. IMPRESSION: 1. Circumferential wall thickening and mucosal hyperenhancement of the descending duodenum, suggesting nonspecific infectious or inflammatory duodenitis. 2. Nonobstructive left nephrolithiasis. No right-sided calculi, ureteral calculi, or hydronephrosis. Aortic Atherosclerosis (ICD10-I70.0). Electronically Signed   By: Jearld Lesch M.D.   On: 07/12/2022 12:19   CT Angio Chest PE W and/or Wo Contrast  Result Date: 07/12/2022 CLINICAL DATA:  PE suspected, shortness of breath, history of CHF, flu-like symptoms EXAM: CT ANGIOGRAPHY CHEST WITH CONTRAST TECHNIQUE: Multidetector CT imaging of the chest was performed using the standard protocol during bolus administration of intravenous contrast. Multiplanar CT image reconstructions and MIPs were obtained to evaluate the vascular anatomy. RADIATION DOSE REDUCTION: This exam was performed according to the departmental dose-optimization program which includes automated exposure control, adjustment of the mA and/or kV according to patient size and/or use of iterative reconstruction technique. CONTRAST:  OMNIPAQUE IOHEXOL 350 MG/ML SOLN COMPARISON:  08/29/2021 FINDINGS: Cardiovascular: Satisfactory opacification of the pulmonary arteries to the segmental level. No evidence of pulmonary embolism. Normal heart size. Three-vessel coronary artery calcifications. No pericardial effusion. Scattered aortic atherosclerosis. Mediastinum/Nodes: No enlarged mediastinal, hilar, or axillary lymph nodes. Small hiatal hernia. Thyroid gland, trachea, and esophagus demonstrate no significant findings. Lungs/Pleura: Dependent bibasilar  atelectasis or scarring. Small pleural effusions. Upper Abdomen: Please see separately reported examination of the abdomen. Musculoskeletal: No chest wall abnormality. No acute osseous findings. Review of the MIP images confirms the above findings. IMPRESSION: 1. Negative examination for pulmonary embolism. 2. Small pleural effusions. 3. Dependent bibasilar atelectasis or scarring. 4. Coronary artery disease. Aortic Atherosclerosis (ICD10-I70.0). Electronically Signed   By: Jearld Lesch M.D.   On: 07/12/2022 12:11   DG Chest Portable 1 View  Result Date: 07/12/2022 CLINICAL DATA:  Shortness of breath. EXAM: PORTABLE CHEST 1 VIEW COMPARISON:  Chest x-ray April 6, 24. FINDINGS: Low lung volumes. No consolidation. No visible pleural effusions or pneumothorax. Borderline enlargement the cardiac silhouette, likely accentuated by technique. No acute osseous abnormality. IMPRESSION: Low lung volumes without evidence of acute cardiopulmonary disease. Electronically Signed   By: Feliberto Harts M.D.   On: 07/12/2022 09:56     Assessment/Plan: 62 year old male with osteomyelitis in his foot as the original source of infection. 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 No neurosurgical intervention is warranted at this time. I believe that this just needs IV abx for now with medical management. Discussed case with Dr. Yetta Barre who agrees.   Tiana Loft [facility] 07/25/2022 8:51 PM

## 2022-07-26 ENCOUNTER — Inpatient Hospital Stay (HOSPITAL_COMMUNITY): Payer: Medicaid Other

## 2022-07-26 DIAGNOSIS — I739 Peripheral vascular disease, unspecified: Secondary | ICD-10-CM

## 2022-07-26 DIAGNOSIS — I70261 Atherosclerosis of native arteries of extremities with gangrene, right leg: Secondary | ICD-10-CM

## 2022-07-26 DIAGNOSIS — M869 Osteomyelitis, unspecified: Secondary | ICD-10-CM

## 2022-07-26 DIAGNOSIS — E43 Unspecified severe protein-calorie malnutrition: Secondary | ICD-10-CM

## 2022-07-26 LAB — BLOOD CULTURE ID PANEL (REFLEXED) - BCID2

## 2022-07-26 LAB — GLUCOSE, CAPILLARY
Glucose-Capillary: 264 mg/dL — ABNORMAL HIGH (ref 70–99)
Glucose-Capillary: 121 mg/dL — ABNORMAL HIGH (ref 70–99)
Glucose-Capillary: 214 mg/dL — ABNORMAL HIGH (ref 70–99)
Glucose-Capillary: 224 mg/dL — ABNORMAL HIGH (ref 70–99)

## 2022-07-26 LAB — PHOSPHORUS: Phosphorus: 3.1 mg/dL (ref 2.5–4.6)

## 2022-07-26 LAB — COMPREHENSIVE METABOLIC PANEL
ALT: 35 U/L (ref 0–44)
AST: 28 U/L (ref 15–41)
Albumin: 1.8 g/dL — ABNORMAL LOW (ref 3.5–5.0)
Alkaline Phosphatase: 137 U/L — ABNORMAL HIGH (ref 38–126)
Anion gap: 12 (ref 5–15)
BUN: 11 mg/dL (ref 6–20)
CO2: 24 mmol/L (ref 22–32)
Calcium: 8.8 mg/dL — ABNORMAL LOW (ref 8.9–10.3)
Chloride: 90 mmol/L — ABNORMAL LOW (ref 98–111)
Creatinine, Ser: 1.18 mg/dL (ref 0.61–1.24)
GFR, Estimated: >60 mL/min (ref >60)
Glucose, Bld: 258 mg/dL — ABNORMAL HIGH (ref 70–99)
Potassium: 4 mmol/L (ref 3.5–5.1)
Sodium: 126 mmol/L — ABNORMAL LOW (ref 135–145)
Total Bilirubin: 0.4 mg/dL (ref 0.3–1.2)
Total Protein: 6.7 g/dL (ref 6.5–8.1)

## 2022-07-26 LAB — CBC WITH DIFFERENTIAL/PLATELET
Abs Immature Granulocytes: 0.05 10*3/uL (ref 0.00–0.07)
Basophils Absolute: 0.1 10*3/uL (ref 0.0–0.1)
Basophils Relative: 1 %
Eosinophils Absolute: 0.2 10*3/uL (ref 0.0–0.5)
Eosinophils Relative: 2 %
HCT: 29 % — ABNORMAL LOW (ref 39.0–52.0)
Hemoglobin: 10.1 g/dL — ABNORMAL LOW (ref 13.0–17.0)
Immature Granulocytes: 1 %
Lymphocytes Relative: 12 %
Lymphs Abs: 1.2 10*3/uL (ref 0.7–4.0)
MCH: 30.9 pg (ref 26.0–34.0)
MCHC: 34.8 g/dL (ref 30.0–36.0)
MCV: 88.7 fL (ref 80.0–100.0)
Monocytes Absolute: 1.1 10*3/uL — ABNORMAL HIGH (ref 0.1–1.0)
Monocytes Relative: 11 %
Neutro Abs: 7.1 10*3/uL (ref 1.7–7.7)
Neutrophils Relative %: 73 %
Platelets: 328 10*3/uL (ref 150–400)
RBC: 3.27 MIL/uL — ABNORMAL LOW (ref 4.22–5.81)
RDW: 13.6 % (ref 11.5–15.5)
WBC: 9.7 10*3/uL (ref 4.0–10.5)
nRBC: 0 % (ref 0.0–0.2)

## 2022-07-26 LAB — CULTURE, BLOOD (ROUTINE X 2): Special Requests: ADEQUATE

## 2022-07-26 LAB — MAGNESIUM: Magnesium: 1.5 mg/dL — ABNORMAL LOW (ref 1.7–2.4)

## 2022-07-26 LAB — PROTIME-INR
INR: 1.7 — ABNORMAL HIGH (ref 0.8–1.2)
Prothrombin Time: 20.1 seconds — ABNORMAL HIGH (ref 11.4–15.2)

## 2022-07-26 MED ORDER — MAGNESIUM OXIDE -MG SUPPLEMENT 400 (240 MG) MG PO TABS
400.0000 mg | ORAL_TABLET | Freq: Two times a day (BID) | ORAL | Status: AC
  Administered 2022-07-26 (×2): 400 mg via ORAL
  Filled 2022-07-26 (×2): qty 1

## 2022-07-26 MED ORDER — POLYETHYLENE GLYCOL 3350 17 G PO PACK
17.0000 g | PACK | Freq: Two times a day (BID) | ORAL | Status: AC
  Administered 2022-07-26 – 2022-07-27 (×4): 17 g via ORAL
  Filled 2022-07-26 (×4): qty 1

## 2022-07-26 MED ORDER — VANCOMYCIN HCL IN DEXTROSE 1-5 GM/200ML-% IV SOLN
1000.0000 mg | Freq: Two times a day (BID) | INTRAVENOUS | Status: DC
  Administered 2022-07-26 – 2022-08-03 (×16): 1000 mg via INTRAVENOUS
  Filled 2022-07-26 (×16): qty 200

## 2022-07-26 NOTE — Progress Notes (Signed)
Pharmacy Antibiotic Note  David [name] is a 62 y.o. 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  Pharmacy has been consulted for Vancomycin dosing.  Noted Oritavancin 1200 mg x 1 given on 4/15, since ~1 week out, okay to resume Vancomycin. SCr 1.18  Plan: - Start Vancomycin 1g IV every 12 hours (eAUC 505, VT 14.5, SCr 1.18, Vd 0.72) - Will continue to follow renal function, culture results, LOT, and antibiotic de-escalation plans   Height: 6' (182.9 cm) Weight: 84.5 kg (186 lb 4.6 oz) IBW/kg (Calculated) : 77.6  Temp (24hrs), Avg:98.5 F (36.9 C), Min:98 F (36.7 C), Max:99.1 F (37.3 C)  Recent Labs  Lab 07/22/22 0306 07/23/22 0240 07/24/22 0500 07/25/22 0500 07/26/22 0240  WBC 10.4 9.0 12.5* 9.6 9.7  CREATININE 1.23 1.21 1.24 1.06 1.18  LATICACIDVEN  --   --   --  0.8  --     Estimated Creatinine Clearance: 73.1 mL/min (by C-G formula based on SCr of 1.18 mg/dL).    No Known Allergies  Antimicrobials this admission: Azithro 4/9 x 1 CRO 4/19 x 1 Vancomycin 4/10 >> 4/12; restart 4/23 >> Daptomycin 4/12 >> 4/14; restart 4/21 >> 4/22 Oritavancin 1200 mg 4/15 x 1   Microbiology results: 4/9 COVID >> neg 4/9 RVP >> neg 4/9 BCx >> 4/4 MRSA (S-Vanc/Dapto) 4/11 BCx >> ngF 4/21 BCx >> 2/2 MRSA (pending sensi)  Thank you for allowing pharmacy to be a part of this patient's care.  Georgina Pillion, PharmD, BCPS Infectious Diseases Clinical Pharmacist 07/26/2022 1:30 PM   **Pharmacist phone directory can now be found on [email or web address] ([email or web address]).  Listed under [facility].

## 2022-07-26 NOTE — Inpatient Diabetes Management (Signed)
Inpatient Diabetes Program Recommendations  AACE/ADA: New Consensus Statement on Inpatient Glycemic Control (2015)  Target Ranges:  Prepandial:   less than 140 mg/dL      Peak postprandial:   less than 180 mg/dL (1-2 hours)      Critically ill patients:  140 - 180 mg/dL   Lab Results  Component Value Date   GLUCAP 264 (H) 07/26/2022   HGBA1C 9.1 (H) 07/13/2022    Review of Glycemic Control  Latest Reference Range & Units 07/25/22 07:59 07/25/22 11:49 07/25/22 16:20 07/25/22 22:00 07/26/22 06:06  Glucose-Capillary 70 - 99 mg/dL 469 (H) 629 (H) 98 528 (H) 264 (H)   Diabetes history: DM  Outpatient Diabetes medications:  Basaglar 40 units daily, Humalog 15 units tid with meals  Current orders for Inpatient glycemic control:  Novolog 0-20 units  Semglee 20 units daily  Inpatient Diabetes Program Recommendations:    -   Consider adding Novolog meal coverage 3 units tid with meals (hold if patient eats less than 50% or NPO).   Thanks,  Christena Deem RN, MSN, BC-ADM Inpatient Diabetes Coordinator Team Pager [phone] (8a-5p)

## 2022-07-26 NOTE — Consult Note (Signed)
Chief Complaint: Patient was seen in consultation today for right empyema chest tube drain placement Chief Complaint  Patient presents with   Shortness of Breath   at the request of Dr David Stall   Supervising Physician: Gilmer Mor  Patient Status: [facility] - In-pt  History of Present Illness: David [name] is a 62 y.o. 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 Findings concerning for discitis osteomyelitis at T8-T9, with a loculated, peripherally enhancing fluid collection anterior to the T7-T9 vertebral bodies, concerning for a multiloculated prevertebral abscess. 2. Enhancing material along the anterior aspect of the thecal sac posterior to T8 and T9, and likely along the right aspect of the thecal sac at T9, concerning for epidural phlegmon, which does not cause significant mass effect on the thecal sac. 3. Large right pleural effusion with a thick enhancing rim, concerning for empyema.  Rt pleural effusion; concern for empyema Request made for Rt empyema chest tube drain placement Approved with Dr Loreta Ave LD Eliquis 4/22/pm--- will hold now--- plans for empyema drain in IR 4/25     Past Medical History:  Diagnosis Date   Cardiomyopathy    a. EF 45% in 2019.   CHF (congestive heart failure)    Chronic anticoagulation 09/30/2017   Diabetes mellitus type 2 in nonobese    Does not have health insurance    Essential hypertension 09/26/2017   Financial difficulties    H/O noncompliance with medical treatment, presenting hazards to health    Non-insulin treated type 2 diabetes mellitus 09/26/2017   Persistent atrial fibrillation    Sleep apnea suspected 09/30/2017    Past Surgical History:  Procedure Laterality Date   ABDOMINAL  AORTOGRAM W/LOWER EXTREMITY N/A 09/02/2021   Procedure: ABDOMINAL AORTOGRAM W/LOWER EXTREMITY;  Surgeon: Nada Libman, MD;  Location: [facility];  Service: Cardiovascular;  Laterality: N/A;   ABDOMINAL AORTOGRAM W/LOWER EXTREMITY N/A 12/28/2021   Procedure: ABDOMINAL AORTOGRAM W/LOWER EXTREMITY;  Surgeon: Nada Libman, MD;  Location: [facility];  Service: Cardiovascular;  Laterality: N/A;   AMPUTATION Right 09/04/2021   Procedure: 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;  Surgeon: Nadara Mustard, MD;  Location: [facility] OR;  Service: Orthopedics;  Laterality: Right;   APPENDECTOMY  1971   CARDIOVERSION N/A 09/28/2017   Procedure: CARDIOVERSION;  Surgeon: Thurmon Fair, MD;  Location: [facility] ENDOSCOPY;  Service: Cardiovascular;  Laterality: N/A;   PERIPHERAL VASCULAR BALLOON ANGIOPLASTY  09/02/2021   Procedure: PERIPHERAL VASCULAR BALLOON ANGIOPLASTY;  Surgeon: Nada Libman, MD;  Location: [facility];  Service: Cardiovascular;;   PERIPHERAL VASCULAR BALLOON ANGIOPLASTY  12/28/2021   Procedure: PERIPHERAL VASCULAR BALLOON ANGIOPLASTY;  Surgeon: Nada Libman, MD;  Location: [facility];  Service: Cardiovascular;;  Posterior Tibial PTA only   TEE WITHOUT CARDIOVERSION N/A 09/28/2017   Procedure: TRANSESOPHAGEAL ECHOCARDIOGRAM (TEE);  Surgeon: Thurmon Fair, MD;  Location: [facility] ENDOSCOPY;  Service: Cardiovascular;  Laterality: N/A;   TEE WITHOUT CARDIOVERSION N/A 09/03/2021   Procedure: TRANSESOPHAGEAL ECHOCARDIOGRAM (TEE);  Surgeon: Chilton Si, MD;  Location: [facility] ENDOSCOPY;  Service: Cardiovascular;  Laterality: N/A;   TEE WITHOUT CARDIOVERSION N/A 07/15/2022   Procedure: TRANSESOPHAGEAL ECHOCARDIOGRAM;  Surgeon: Thurmon Fair, MD;  Location: [facility];  Service: Cardiovascular;  Laterality: N/A;    Allergies:  Patient has no known allergies.  Medications: Prior to Admission medications   Medication Sig Start Date End Date Taking? Authorizing Provider  apixaban  (ELIQUIS) 5 MG TABS tablet Take 1 tablet (5 mg total) by mouth 2 (two) times daily. 05/10/21  Yes Tereso Newcomer T, PA-C  aspirin EC 81 MG tablet Take 1 tablet (81 mg total) by mouth daily. Swallow whole. 09/07/21  Yes Burnadette Pop, MD  atorvastatin (LIPITOR) 80 MG tablet Take 1 tablet (80 mg total) by mouth daily. 05/17/22  Yes [name], MD  busPIRone (BUSPAR) 10 MG tablet Take 1 tablet (10 mg total) by mouth 2 (two) times daily. Patient taking differently: Take 10 mg by mouth daily as needed (For anxiety). 12/16/21  Yes Georgian Co M, PA-C  carvedilol (COREG) 6.25 MG tablet Take 1 tablet (6.25 mg total) by mouth 2 (two) times daily. 07/01/22  Yes Sharlene Dory, PA-C  clopidogrel (PLAVIX) 75 MG tablet Take 1 tablet (75 mg total) by mouth daily with breakfast. 04/13/22  Yes Newlin, Enobong, MD  digoxin (LANOXIN) 0.125 MG tablet Take 1 tablet (125 mcg total) by mouth once daily. 06/03/22  Yes Wendall Stade, MD  famotidine (PEPCID) 20 MG tablet Take 1 tablet (20 mg total) by mouth 2 times daily. 03/24/22  Yes   fluticasone (FLONASE) 50 MCG/ACT nasal spray Place 2 sprays into both nostrils daily. 02/01/22  Yes [name], MD  furosemide (LASIX) 40 MG tablet Take 1 tablet (40 mg total) by mouth daily. 05/17/22  Yes [name], MD  Insulin Glargine (BASAGLAR KWIKPEN) 100 UNIT/ML Inject 40 Units into the skin daily. 04/13/22  Yes Newlin, Enobong, MD  insulin lispro (HUMALOG KWIKPEN) 100 UNIT/ML KwikPen Inject 15 Units into the skin 3 (three) times daily. 04/13/22  Yes Newlin, Odette Horns, MD  sacubitril-valsartan (ENTRESTO) 49-51 MG Take 1 tablet by mouth 2 (two) times daily. Needs to see cardiology for refills Patient taking differently: Take 1 tablet by mouth 2 (two) times daily. 01/04/22  Yes Claiborne Rigg, NP  spironolactone (ALDACTONE) 25 MG tablet Take 1 tablet (25 mg total) by mouth daily. 12/16/21 07/20/22 Yes McClung, Marzella Schlein, PA-C  HYDROcodone-acetaminophen (NORCO/VICODIN) 5-325 MG  tablet Take 1 tablet by mouth every 4 (four) hours as needed for up to 7 days for Moderate pain (4-6) or Severe pain (7-10). Patient not taking: Reported on 07/12/2022 03/24/22        Family History  Problem Relation Age of Onset   Hypertension Mother     Social History   Socioeconomic History   Marital status: Married    Spouse name: Not on file   Number of children: Not on file   Years of education: Not on file   Highest education level: Not on file  Occupational History   Not on file  Tobacco Use   Smoking status: Former    Years: 15    Types: Cigarettes    Quit date: 03/18/2018    Years since quitting: 4.3   Smokeless tobacco: Never   Tobacco comments:    09/26/2017 "2-3 cigarettes/month now"  Vaping Use   Vaping Use: Never used  Substance and Sexual Activity   Alcohol use: Not Currently    Alcohol/week: 3.0 standard drinks of alcohol    Types: 3 Cans of beer per week    Comment: States he quit drinking 8-9 months ago   Drug use: Never   Sexual activity: Not Currently  Other Topics Concern   Not on file  Social  History Narrative   Not on file   Social Determinants of Health   Financial Resource Strain: Not on file  Food Insecurity: No Food Insecurity (07/12/2022)   Hunger Vital Sign    Worried About Running Out of Food in the Last Year: Never true    Ran Out of Food in the Last Year: Never true  Transportation Needs: No Transportation Needs (07/12/2022)   PRAPARE - Administrator, Civil Service (Medical): No    Lack of Transportation (Non-Medical): No  Physical Activity: Not on file  Stress: Stress Concern Present (09/26/2017)   [employer] of Occupational Health - Occupational Stress Questionnaire    Feeling of Stress : Very much  Social Connections: Not on file    Review of Systems: A 12 point ROS discussed and pertinent positives are indicated in the HPI above.  All other systems are negative.  Review of Systems  Constitutional:  Positive  for activity change, appetite change and fever.  Respiratory:  Positive for cough, shortness of breath and wheezing.   Cardiovascular:  Positive for chest pain.  Gastrointestinal:  Positive for nausea.  Neurological:  Positive for weakness.  Psychiatric/Behavioral:  Negative for behavioral problems and confusion.     Vital Signs: BP 128/88   Pulse 78   Temp 98.5 F (36.9 C) (Oral)   Resp (!) 23   Ht 6' (1.829 m)   Wt 186 lb 4.6 oz (84.5 kg)   SpO2 98%   BMI 25.27 kg/m     Physical Exam Vitals reviewed.  HENT:     Mouth/Throat:     Mouth: Mucous membranes are moist.  Pulmonary:     Effort: Pulmonary effort is normal.     Breath sounds: Wheezing present.  Musculoskeletal:        General: Normal range of motion.     Right lower leg: Tenderness present.     Comments: Ppst Rt foot partial amputation  Skin:    General: Skin is warm.  Neurological:     Mental Status: He is alert and oriented to person, place, and time.  Psychiatric:        Behavior: Behavior normal.     Imaging: MR THORACIC SPINE W WO CONTRAST  Result Date: 07/25/2022 CLINICAL DATA:  Follow-up MR for MRSA bacteremia and concern for seeding EXAM: MRI THORACIC AND LUMBAR SPINE WITHOUT AND WITH CONTRAST TECHNIQUE: Multiplanar and multiecho pulse sequences of the thoracic and lumbar spine were obtained without and with intravenous contrast. CONTRAST:  8mL GADAVIST GADOBUTROL 1 MMOL/ML IV SOLN COMPARISON:  07/13/2022 MRI thoracic and lumbar spine FINDINGS: MRI THORACIC SPINE FINDINGS Alignment: Mild scoliosis and exaggeration of the normal thoracic kyphosis. No significant listhesis. Vertebrae: Decreased T1 signal, increased T2 signal, and contrast enhancement throughout the T8 and T11 vertebral bodies, with increased fluid signal and enhancement in the T8-T9 disc space (series 21, image 12 and series 25, image 12), which is new from the prior exam. No acute fracture or suspicious osseous lesion. Cord: T1  hypointense, contrast enhancing material along the anterior aspect of the thecal sac posterior to T8 and T9 (series 23 and 26, images 24-27), most likely phlegmon. There is likely phlegmon along the right aspect of the thecal sac posterior to T9 (series 25, image 11), although this area is not covered well on the axial sequences. This does not cause significant mass effect on the thecal sac. Prominent epidural fat also does not cause significant mass effect on the thecal sac. No  abnormal spinal cord enhancement. Paraspinal and other soft tissues: Anterior to the T7-T9 vertebral bodies, there is a loculated, peripherally enhancing fluid collection, which measures up to 3.1 x 5.9 x 5.9 cm (AP x TR x CC) (series 26, image 23 and 24; series 25, image 5), concerning for multiloculated prevertebral abscess. In addition, there is a large right pleural effusion with a thick enhancing rim, concerning for empyema (series 26, image 23). Disc levels: Mild degenerative changes, without significant spinal canal stenosis or neural foraminal narrowing. MRI LUMBAR SPINE FINDINGS Segmentation:  5 lumbar type vertebral bodies. Alignment:  Mild levoscoliosis.  No significant listhesis. Vertebrae: No acute fracture, evidence of discitis, suspicious osseous lesion, or abnormal osseous enhancement. Conus medullaris: Extends to the L1 level and appears normal. No evidence of epidural collection. No abnormal enhancement. Paraspinal and other soft tissues: No acute finding. Redemonstrated increased T2 signal in the paraspinous musculature (series 2, images 3 and 9), without abnormal in his is no focal fluid collection in the paraspinous muscles. Disc levels: Degenerative changes are stable from the 07/13/2022 exam. Please see that report for detailed findings. IMPRESSION: 1. Findings concerning for discitis osteomyelitis at T8-T9, with a loculated, peripherally enhancing fluid collection anterior to the T7-T9 vertebral bodies, concerning for  a multiloculated prevertebral abscess. 2. Enhancing material along the anterior aspect of the thecal sac posterior to T8 and T9, and likely along the right aspect of the thecal sac at T9, concerning for epidural phlegmon, which does not cause significant mass effect on the thecal sac. 3. Large right pleural effusion with a thick enhancing rim, concerning for empyema. 4. No evidence of discitis or osteomyelitis in the lumbar spine. These results will be called to the ordering clinician or representative by the Radiologist Assistant, and communication documented in the PACS or [employer]. Electronically Signed   By: Wiliam Ke M.D.   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 CONTRAST:  8mL GADAVIST GADOBUTROL 1 MMOL/ML IV SOLN COMPARISON:  07/13/2022 MRI thoracic and lumbar spine FINDINGS: MRI THORACIC SPINE FINDINGS Alignment: Mild scoliosis and exaggeration of the normal thoracic kyphosis. No significant listhesis. Vertebrae: Decreased T1 signal, increased T2 signal, and contrast enhancement throughout the T8 and T11 vertebral bodies, with increased fluid signal and enhancement in the T8-T9 disc space (series 21, image 12 and series 25, image 12), which is new from the prior exam. No acute fracture or suspicious osseous lesion. Cord: T1 hypointense, contrast enhancing material along the anterior aspect of the thecal sac posterior to T8 and T9 (series 23 and 26, images 24-27), most likely phlegmon. There is likely phlegmon along the right aspect of the thecal sac posterior to T9 (series 25, image 11), although this area is not covered well on the axial sequences. This does not cause significant mass  effect on the thecal sac. Prominent epidural fat also does not cause significant mass effect on the thecal sac. No abnormal spinal cord enhancement. Paraspinal and other soft tissues: Anterior to the T7-T9 vertebral bodies, there is a loculated, peripherally enhancing fluid collection, which measures up to 3.1 x 5.9 x 5.9 cm (AP x TR x CC) (series 26, image 23 and 24; series 25, image 5), concerning for multiloculated  prevertebral abscess. In addition, there is a large right pleural effusion with a thick enhancing rim, concerning for empyema (series 26, image 23). Disc levels: Mild degenerative changes, without significant spinal canal stenosis or neural foraminal narrowing. MRI LUMBAR SPINE FINDINGS Segmentation:  5 lumbar type vertebral bodies. Alignment:  Mild levoscoliosis.  No significant listhesis. Vertebrae: No acute fracture, evidence of discitis, suspicious osseous lesion, or abnormal osseous enhancement. Conus medullaris: Extends to the L1 level and appears normal. No evidence of epidural collection. No abnormal enhancement. Paraspinal and other soft tissues: No acute finding. Redemonstrated increased T2 signal in the paraspinous musculature (series 2, images 3 and 9), without abnormal in his is no focal fluid collection in the paraspinous muscles. Disc levels: Degenerative changes are stable from the 07/13/2022 exam. Please see that report for detailed findings. IMPRESSION: 1. Findings concerning for discitis osteomyelitis at T8-T9, with a loculated, peripherally enhancing fluid collection anterior to the T7-T9 vertebral bodies, concerning for a multiloculated prevertebral abscess. 2. Enhancing material along the anterior aspect of the thecal sac posterior to T8 and T9, and likely along the right aspect of the thecal sac at T9, concerning for epidural phlegmon, which does not cause significant mass effect on the thecal sac. 3. Large right pleural effusion with a thick enhancing rim, concerning for  empyema. 4. No evidence of discitis or osteomyelitis in the lumbar spine. These results will be called to the ordering clinician or representative by the Radiologist Assistant, and communication documented in the PACS or [employer]. Electronically Signed   By: Wiliam Ke M.D.   On: 07/25/2022 16:55   MR FOOT RIGHT W WO CONTRAST  Result Date: 07/24/2022 CLINICAL DATA:  Diabetic foot wound EXAM: MRI OF THE RIGHT FOREFOOT WITHOUT AND WITH CONTRAST TECHNIQUE: Multiplanar, multisequence MR imaging of the right forefoot was performed before and after the administration of intravenous contrast. CONTRAST:  8mL GADAVIST GADOBUTROL 1 MMOL/ML IV SOLN COMPARISON:  X-ray 07/13/2022 FINDINGS: Bones/Joint/Cartilage Postsurgical changes from fifth ray resection at the level of the distal fifth metatarsal diaphysis. There is intense bone marrow edema and enhancement of the residual fifth metatarsal diaphysis extending to the level of the proximal metaphysis (series 6, image 15). Confluent low T1 signal changes near the resection margin are compatible with acute osteomyelitis. Prior fourth ray resection at the level of the distal fourth metatarsal diaphysis without evidence of osteomyelitis. Remaining osseous structures are intact. No additional sites of osteomyelitis. No fracture or dislocation. Ligaments Intact Lisfranc ligament. Remaining collateral ligaments are intact. Muscles and Tendons Amputation changes to the musculotendinous structures at the lateral forefoot. Diffuse edema-like intramuscular signal which may be related to denervation or myositis. No tenosynovitis. Soft tissues Soft tissue wound or ulceration at the lateral aspect of the forefoot near the residual fifth metatarsal with enhancing sinus tract extending to the underlying resection margin (series 9, images 10-13). Subcutaneous edema of the dorsal forefoot. No organized or drainable fluid collections. IMPRESSION: 1. Postsurgical changes from fifth  ray resection to the level of the distal fifth metatarsal diaphysis. 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 2. Soft tissue wound or ulceration at the lateral aspect of the forefoot near the residual fifth metatarsal with enhancing sinus tract extending to the underlying resection margin. 3. Prior fourth ray resection at the level of the distal fourth metatarsal diaphysis without evidence of osteomyelitis. 4. Diffuse edema-like intramuscular signal which may be related to denervation or myositis. Electronically Signed  By: Duanne Guess D.O.   On: 07/24/2022 15:58   DG Chest 2 View  Result Date: 07/24/2022 CLINICAL DATA:  Follow-up. Worsening dyspnea. Mild productive cough EXAM: CHEST - 2 VIEW COMPARISON:  07/20/2022 x-ray FINDINGS: Stable right-sided PICC. Persistent moderate right effusion and adjacent opacities. Increasing opacity left lung base. No pneumothorax. Stable cardiopericardial silhouette. Increasing vascular congestion. Tortuous aorta. Dilated stomach with air-fluid level seen beneath the left hemidiaphragm. Please correlate with any symptoms IMPRESSION: Increasing pleural effusion on the right and bilateral lung base opacities. Increasing vascular congestion Dilated stomach with an air-fluid level beneath the left hemidiaphragm. Please correlate with any symptoms and additional workup as clinically appropriate Electronically Signed   By: Karen Kays M.D.   On: 07/24/2022 15:36   DG CHEST PORT 1 VIEW  Result Date: 07/20/2022 CLINICAL DATA:  Shortness of breath EXAM: PORTABLE CHEST 1 VIEW COMPARISON:  07/16/2022 FINDINGS: Right PICC is again noted in satisfactory position. Cardiac shadow is enlarged but stable accentuated by the portable technique. Aortic calcifications are noted. Left lung remains clear. Increasing right-sided infiltrate and effusion are noted. No bony abnormality is noted. IMPRESSION: Increasing  right basilar infiltrate with associated effusion. Electronically Signed   By: Alcide Clever M.D.   On: 07/20/2022 12:35   DG CHEST PORT 1 VIEW  Result Date: 07/16/2022 CLINICAL DATA:  Status post PICC line placement. EXAM: PORTABLE CHEST 1 VIEW COMPARISON:  Chest x-ray dated 07/12/2022. FINDINGS: PICC line appears well positioned with tip at the level of the lower SVC/RIGHT atrium. Ill-defined opacity at the RIGHT lung base. Ground-glass opacities within the LEFT upper lung. Probable small bilateral pleural effusions. No pneumothorax is seen. IMPRESSION: 1. PICC line appears well positioned with tip at the level of the lower SVC/RIGHT atrium. 2. Ill-defined opacity at the RIGHT lung base, atelectasis versus pneumonia. 3. Ground-glass opacities within the LEFT upper lung, suspicious for multifocal pneumonia. Electronically Signed   By: Bary Richard M.D.   On: 07/16/2022 13:03   [place] EKG SITE RITE  Result Date: 07/16/2022 If Site Rite image not attached, placement could not be confirmed due to current cardiac rhythm.  ECHO TEE  Result Date: 07/15/2022    TRANSESOPHOGEAL ECHO REPORT   Patient Name:   TREMANE SPURGEON Date of Exam: 07/15/2022 Medical Rec #:  [id number]   Height:       72.0 in Accession #:    [id number]  Weight:       178.6 lb Date of Birth:  April 08, 1960    BSA:          2.030 m Patient Age:    61 years    BP:           126/75 mmHg Patient Gender: M           HR:           87 bpm. Exam Location:  Inpatient Procedure: Transesophageal Echo and Color Doppler Indications:     bacteremia  History:         Patient has prior history of Echocardiogram examinations, most                  recent 07/13/2022. Arrythmias:Atrial Fibrillation; Risk                  Factors:Hypertension, Diabetes and Dyslipidemia.  Sonographer:     Delcie Roch RDCS Referring Phys:  Judye Bos DUNN Diagnosing Phys: Thurmon Fair MD PROCEDURE: After discussion of the risks and benefits of a  TEE, an informed consent was obtained  from the patient. The transesophogeal probe was passed without difficulty through the esophogus of the patient. Imaged were obtained with the patient in a left lateral decubitus position. Sedation performed by different physician. The patient was monitored while under deep sedation. Anesthestetic sedation was provided intravenously by Anesthesiology: 76mg  of Propofol. The patient's vital signs; including heart rate, blood pressure, and oxygen saturation; remained stable throughout the procedure. The patient developed no complications during the procedure.  IMPRESSIONS  1. Left ventricular ejection fraction, by estimation, is 60 to 65%. The left ventricle has normal function. The left ventricle has no regional wall motion abnormalities.  2. Right ventricular systolic function is normal. The right ventricular size is normal.  3. Left atrial size was severely dilated. No left atrial/left atrial appendage thrombus was detected.  4. Right atrial size was severely dilated.  5. The mitral valve is normal in structure. Trivial mitral valve regurgitation. No evidence of mitral stenosis.  6. The aortic valve is normal in structure. Aortic valve regurgitation is not visualized. No aortic stenosis is present.  7. The inferior vena cava is normal in size with greater than 50% respiratory variability, suggesting right atrial pressure of 3 mmHg. Conclusion(s)/Recommendation(s): Normal biventricular function without evidence of hemodynamically significant valvular heart disease. No evidence of vegetation/infective endocarditis on this transesophageael echocardiogram. FINDINGS  Left Ventricle: Left ventricular ejection fraction, by estimation, is 60 to 65%. The left ventricle has normal function. The left ventricle has no regional wall motion abnormalities. The left ventricular internal cavity size was normal in size. There is  no left ventricular hypertrophy. Right Ventricle: The right ventricular size is normal. No increase in  right ventricular wall thickness. Right ventricular systolic function is normal. Left Atrium: Left atrial size was severely dilated. No left atrial/left atrial appendage thrombus was detected. Right Atrium: Right atrial size was severely dilated. Pericardium: There is no evidence of pericardial effusion. Mitral Valve: The mitral valve is normal in structure. Trivial mitral valve regurgitation. No evidence of mitral valve stenosis. Tricuspid Valve: The tricuspid valve is normal in structure. Tricuspid valve regurgitation is trivial. No evidence of tricuspid stenosis. Aortic Valve: The aortic valve is normal in structure. Aortic valve regurgitation is not visualized. No aortic stenosis is present. Pulmonic Valve: The pulmonic valve was normal in structure. Pulmonic valve regurgitation is not visualized. No evidence of pulmonic stenosis. Aorta: The aortic root is normal in size and structure. Venous: The inferior vena cava is normal in size with greater than 50% respiratory variability, suggesting right atrial pressure of 3 mmHg. IAS/Shunts: No atrial level shunt detected by color flow Doppler. Thurmon Fair MD Electronically signed by Thurmon Fair MD Signature Date/Time: 07/15/2022/2:11:54 PM    Final    EP STUDY  Result Date: 07/15/2022 See surgical note for result.  MR Lumbar Spine W Wo Contrast  Result Date: 07/14/2022 CLINICAL DATA:  Initial evaluation for MRSA bacteremia, back pain, concern for infection. EXAM: MRI LUMBAR SPINE WITHOUT AND WITH CONTRAST TECHNIQUE: Multiplanar and multiecho pulse sequences of the lumbar spine were obtained without and with intravenous contrast. CONTRAST:  8mL GADAVIST GADOBUTROL 1 MMOL/ML IV SOLN COMPARISON:  None Available. FINDINGS: Segmentation: Standard. Lowest well-formed disc space labeled the L5-S1 level. Alignment: Mild levoscoliosis. Alignment otherwise normal with preservation of the normal lumbar lordosis. No significant listhesis. Vertebrae: Mild chronic  compression deformity noted at the superior endplate of L4 without retropulsion. Vertebral body height otherwise maintained with no other acute or recent fracture. Bone  marrow signal intensity heterogeneous with a few scattered benign hemangiomata. No worrisome osseous lesions. No evidence for osteomyelitis discitis or septic arthritis. Conus medullaris and cauda equina: Conus extends to the T12 level. Conus and cauda equina appear normal. No epidural abscess or other collections. No abnormal enhancement. Paraspinal and other soft tissues: Mild diffuse edema seen throughout the lower posterior paraspinous musculature (series 6, images 14, 6). Finding is nonspecific. No loculated or drainable fluid collections. Paraspinous soft tissues demonstrate no other acute finding. Disc levels: L1-2:  Unremarkable. L2-3: Disc desiccation without significant disc bulge. Mild facet spurring. No canal or foraminal stenosis. L3-4: Disc desiccation with mild diffuse disc bulge. Superimposed small biforaminal to extraforaminal disc protrusions closely approximate the exiting left L3 nerve roots. Mild bilateral facet hypertrophy. Resultant mild canal with right worse than left lateral recess stenosis. Mild bilateral L3 foraminal narrowing. L4-5: Disc desiccation with mild disc bulge. Mild bilateral facet hypertrophy with associated trace joint effusions. No significant spinal stenosis. Mild bilateral L4 foraminal narrowing. L5-S1: Degenerative intervertebral disc space narrowing with disc desiccation and diffuse disc bulge. Reactive endplate spurring. Superimposed left subarticular disc protrusion with slight inferior migration. Protruding disc contacts the descending left S1 nerve root as it courses of the left lateral recess (series 8, image 32). Resultant mild narrowing of the left lateral recess. Central canal remains patent. Mild left L5 foraminal narrowing. IMPRESSION: 1. Mild diffuse edema involving the lower posterior  paraspinous musculature. Finding is nonspecific, and could be related to overall volume status. Changes of acute myositis, which could be either infectious or inflammatory in nature could also be considered. No loculated or drainable fluid collections. 2. No other MRI evidence for acute infection within the lumbar spine. No evidence for osteomyelitis discitis or septic arthritis. 3. Small left subarticular disc protrusion at L5-S1, contacting the descending left S1 nerve root. 4. Degenerative spondylosis at L3-4 and L4-5 with resultant mild bilateral L3 and L4 foraminal stenosis. Electronically Signed   By: Rise Mu M.D.   On: 07/14/2022 04:34   MR THORACIC SPINE W WO CONTRAST  Result Date: 07/14/2022 CLINICAL DATA:  Initial evaluation for MRSA bacteremia, concern for infection. EXAM: MRI THORACIC WITHOUT AND WITH CONTRAST TECHNIQUE: Multiplanar and multiecho pulse sequences of the thoracic spine were obtained without and with intravenous contrast. CONTRAST:  8mL GADAVIST GADOBUTROL 1 MMOL/ML IV SOLN COMPARISON:  None Available. FINDINGS: Alignment: Mild dextroscoliosis with exaggeration of the normal thoracic kyphosis. No listhesis. Vertebrae: Mild chronic compression deformities noted about the T2 through T6 as well as the T9 vertebral bodies. No significant retropulsion. No acute or recent fracture. Bone marrow signal intensity within normal limits. Atypical hemangioma noted largely replacing the T8 vertebral body. No worrisome osseous lesions. No evidence for osteomyelitis discitis or septic arthritis. Cord: Normal signal and morphology. No epidural abscess or other collection. No abnormal enhancement. Paraspinal and other soft tissues: No paraspinous inflammatory changes or collections. Small layering bilateral pleural effusions noted. Disc levels: T2-3: Degenerative disc bulging flattens and partially effaces the ventral thecal sac. No significant spinal stenosis. Foramina remain patent. T7-8:  Tiny central disc protrusion indents the ventral thecal sac. Associated annular fissure. No significant canal or foraminal stenosis. T11-12: Small central disc protrusion with slight superior migration. No significant spinal stenosis. Foramina remain patent. Otherwise, minor for age spondylosis seen elsewhere within the thoracic spine. No other significant disc bulge or focal disc herniation. No significant spinal stenosis. Foramina remain patent. IMPRESSION: 1. No MRI evidence for acute infection within the thoracic  spine. 2. Small layering bilateral pleural effusions. 3. Mild chronic compression deformities about the T2 through T6 as well as the T9 vertebral bodies. 4. Underlying mild for age thoracic spondylosis without significant stenosis or neural impingement. Electronically Signed   By: Rise Mu M.D.   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 There is no acute fracture. No focal osseous lesion or cortical erosion. Soft tissues are within normal limits. Plantar calcaneal spur is present. 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 No radiographic evidence of osteomyelitis. Electronically Signed   By: Darliss Cheney M.D.   On: 07/13/2022 17:52   ECHOCARDIOGRAM COMPLETE  Result Date: 07/13/2022    ECHOCARDIOGRAM REPORT   Patient Name:   DARROW BARREIRO Date of Exam: 07/13/2022 Medical Rec #:  [id number]   Height:       72.0 in Accession #:    [id number]  Weight:       178.6 lb Date of Birth:  Mar 17, 1961    BSA:          2.030 m Patient Age:    61 years    BP:           117/81 mmHg Patient Gender: M           HR:           81 bpm. Exam Location:  Inpatient Procedure: 2D  Echo, Color Doppler and Cardiac Doppler Indications:    Bacteremia  History:        Patient has prior history of Echocardiogram examinations, most                 recent 09/03/2021. Arrythmias:Atrial Fibrillation; Risk                 Factors:Hypertension, Dyslipidemia and Current Smoker. OSA,                 Sepsis.  Sonographer:    Milbert Coulter Referring Phys: [id number] Daylene Katayama [facility]  Sonographer Comments: OSA. IMPRESSIONS  1. Left ventricular ejection fraction, by estimation, is 60 to 65%. The left ventricle has normal function. The left ventricle has no regional wall motion abnormalities. Left ventricular diastolic function could not be evaluated.  2. Right ventricular systolic function is normal. The right ventricular size is normal.  3. Left atrial size was mildly dilated.  4. Right atrial size was mildly dilated.  5. The mitral valve is normal in structure. Trivial mitral valve regurgitation. No evidence of mitral stenosis.  6. The aortic valve is normal in structure. Aortic valve regurgitation is not visualized. No aortic stenosis is present.  7. The inferior vena cava is normal in size with greater than 50% respiratory variability, suggesting right atrial pressure of 3 mmHg. FINDINGS  Left Ventricle: Left ventricular ejection fraction, by estimation, is 60 to 65%. The left ventricle has normal function. The left ventricle has no regional wall motion abnormalities. The left ventricular internal cavity size was normal in size. There is  no left ventricular hypertrophy. Left ventricular diastolic function could not be evaluated due to atrial fibrillation. Left ventricular diastolic function could not be evaluated. Right Ventricle: The right ventricular size is normal. Right ventricular systolic function  is normal. Left Atrium: Left atrial size was mildly dilated. Right Atrium: Right atrial size was mildly dilated. Pericardium: There is no evidence of pericardial effusion. Mitral Valve: The mitral valve is normal  in structure. Mild mitral annular calcification. Trivial mitral valve regurgitation. No evidence of mitral valve stenosis. Tricuspid Valve: The tricuspid valve is normal in structure. Tricuspid valve regurgitation is trivial. No evidence of tricuspid stenosis. Aortic Valve: The aortic valve is normal in structure. Aortic valve regurgitation is not visualized. No aortic stenosis is present. Pulmonic Valve: The pulmonic valve was normal in structure. Pulmonic valve regurgitation is trivial. No evidence of pulmonic stenosis. Aorta: The aortic root is normal in size and structure. Venous: The inferior vena cava is normal in size with greater than 50% respiratory variability, suggesting right atrial pressure of 3 mmHg. IAS/Shunts: No atrial level shunt detected by color flow Doppler.  LEFT VENTRICLE PLAX 2D LVIDd:         4.00 cm     Diastology LVIDs:         2.60 cm     LV e' medial:    9.57 cm/s LV PW:         1.10 cm     LV E/e' medial:  10.0 LV IVS:        1.00 cm     LV e' lateral:   16.33 cm/s LVOT diam:     1.90 cm     LV E/e' lateral: 5.8 LVOT Area:     2.84 cm  LV Volumes (MOD) LV vol d, MOD A2C: 44.9 ml LV vol d, MOD A4C: 59.5 ml LV vol s, MOD A2C: 12.6 ml LV vol s, MOD A4C: 16.7 ml LV SV MOD A2C:     32.3 ml LV SV MOD A4C:     59.5 ml LV SV MOD BP:      38.0 ml RIGHT VENTRICLE RV Basal diam:  3.60 cm RV Mid diam:    3.60 cm RV S prime:     10.60 cm/s TAPSE (M-mode): 1.8 cm LEFT ATRIUM             Index        RIGHT ATRIUM           Index LA diam:        4.30 cm 2.12 cm/m   RA Area:     18.20 cm LA Vol (A2C):   81.8 ml 40.29 ml/m  RA Volume:   49.10 ml  24.18 ml/m LA Vol (A4C):   68.2 ml 33.59 ml/m LA Biplane Vol: 75.9 ml 37.38 ml/m  MITRAL VALVE MV Area (PHT): 4.06 cm    SHUNTS MV Decel Time: 187 msec    Systemic Diam: 1.90 cm MV E velocity: 95.50 cm/s Olga Millers MD Electronically signed by Olga Millers MD Signature Date/Time: 07/13/2022/9:39:24 AM    Final    CT ABDOMEN PELVIS W  CONTRAST  Result Date: 07/12/2022 CLINICAL DATA:  Abdominal pain EXAM: CT ABDOMEN AND PELVIS WITH CONTRAST TECHNIQUE: Multidetector CT imaging of the abdomen and pelvis was performed using the standard protocol following bolus administration of intravenous contrast. RADIATION DOSE REDUCTION: This exam was performed according to the departmental dose-optimization program which includes automated exposure control, adjustment of the mA and/or kV according to patient size and/or use of iterative reconstruction technique. CONTRAST:  OMNIPAQUE IOHEXOL 350 MG/ML SOLN COMPARISON:  08/29/2021 FINDINGS: Lower chest: Please see separately reported examination of the abdomen and pelvis. Hepatobiliary: No solid liver  abnormality is seen. No gallstones, gallbladder wall thickening, or biliary dilatation. Pancreas: Unremarkable. No pancreatic ductal dilatation or surrounding inflammatory changes. Spleen: Normal in size without significant abnormality. Adrenals/Urinary Tract: Adrenal glands are unremarkable. Small nonobstructive left renal calculi (series 12, image 42). No right-sided calculi, ureteral calculi, or hydronephrosis. Bladder is unremarkable. Stomach/Bowel: Stomach is within normal limits. Circumferential wall thickening and mucosal hyperenhancement of the descending duodenum (series 12, image 32). Appendix is not clearly visualized. Moderate burden of stool and stool balls throughout the colon Vascular/Lymphatic: Aortic atherosclerosis. No enlarged abdominal or pelvic lymph nodes. Reproductive: No mass or other significant abnormality. Other: No abdominal wall hernia or abnormality. No ascites. Musculoskeletal: No acute or significant osseous findings. IMPRESSION: 1. Circumferential wall thickening and mucosal hyperenhancement of the descending duodenum, suggesting nonspecific infectious or inflammatory duodenitis. 2. Nonobstructive left nephrolithiasis. No right-sided calculi, ureteral calculi, or  hydronephrosis. Aortic Atherosclerosis (ICD10-I70.0). Electronically Signed   By: Jearld Lesch M.D.   On: 07/12/2022 12:19   CT Angio Chest PE W and/or Wo Contrast  Result Date: 07/12/2022 CLINICAL DATA:  PE suspected, shortness of breath, history of CHF, flu-like symptoms EXAM: CT ANGIOGRAPHY CHEST WITH CONTRAST TECHNIQUE: Multidetector CT imaging of the chest was performed using the standard protocol during bolus administration of intravenous contrast. Multiplanar CT image reconstructions and MIPs were obtained to evaluate the vascular anatomy. RADIATION DOSE REDUCTION: This exam was performed according to the departmental dose-optimization program which includes automated exposure control, adjustment of the mA and/or kV according to patient size and/or use of iterative reconstruction technique. CONTRAST:  OMNIPAQUE IOHEXOL 350 MG/ML SOLN COMPARISON:  08/29/2021 FINDINGS: Cardiovascular: Satisfactory opacification of the pulmonary arteries to the segmental level. No evidence of pulmonary embolism. Normal heart size. Three-vessel coronary artery calcifications. No pericardial effusion. Scattered aortic atherosclerosis. Mediastinum/Nodes: No enlarged mediastinal, hilar, or axillary lymph nodes. Small hiatal hernia. Thyroid gland, trachea, and esophagus demonstrate no significant findings. Lungs/Pleura: Dependent bibasilar atelectasis or scarring. Small pleural effusions. Upper Abdomen: Please see separately reported examination of the abdomen. Musculoskeletal: No chest wall abnormality. No acute osseous findings. Review of the MIP images confirms the above findings. IMPRESSION: 1. Negative examination for pulmonary embolism. 2. Small pleural effusions. 3. Dependent bibasilar atelectasis or scarring. 4. Coronary artery disease. Aortic Atherosclerosis (ICD10-I70.0). Electronically Signed   By: Jearld Lesch M.D.   On: 07/12/2022 12:11   DG Chest Portable 1 View  Result Date: 07/12/2022 CLINICAL DATA:   Shortness of breath. EXAM: PORTABLE CHEST 1 VIEW COMPARISON:  Chest x-ray April 6, 24. FINDINGS: Low lung volumes. No consolidation. No visible pleural effusions or pneumothorax. Borderline enlargement the cardiac silhouette, likely accentuated by technique. No acute osseous abnormality. IMPRESSION: Low lung volumes without evidence of acute cardiopulmonary disease. Electronically Signed   By: Feliberto Harts M.D.   On: 07/12/2022 09:56   DG Chest 2 View  Result Date: 07/09/2022 CLINICAL DATA:  Cough and fever for 3 days.  Night sweats. EXAM: CHEST - 2 VIEW COMPARISON:  09/05/2021 FINDINGS: The heart size and mediastinal contours are within normal limits. Stable tortuosity of thoracic aorta noted. Both lungs are clear. The visualized skeletal structures are unremarkable. IMPRESSION: No active cardiopulmonary disease. Electronically Signed   By: Danae Orleans M.D.   On: 07/09/2022 17:44    Labs:  CBC: Recent Labs    07/23/22 0240 07/24/22 0500 07/25/22 0500 07/26/22 0240  WBC 9.0 12.5* 9.6 9.7  HGB 10.1* 10.1* 9.5* 10.1*  HCT 30.1* 30.5* 28.5* 29.0*  PLT 366 376  318 328    COAGS: Recent Labs    08/30/21 0004 09/01/21 0454 09/01/21 1358 09/02/21 0329 09/03/21 0314 09/04/21 0419 07/26/22 0625  INR 1.2  --   --   --   --   --  1.7*  APTT 33   < > 78* 59* 77* 68*  --    < > = values in this interval not displayed.    BMP: Recent Labs    07/23/22 0240 07/24/22 0500 07/24/22 1555 07/24/22 1700 07/25/22 0500 07/26/22 0240  NA 130* 124* 128* 126* 126* 126*  K 3.9 4.2  --   --  3.8 4.0  CL 99 94*  --   --  95* 90*  CO2 24 23  --   --  23 24  GLUCOSE 206* 179*  --   --  209* 258*  BUN 21* 15  --   --  14 11  CALCIUM 8.5* 8.5*  --   --  8.4* 8.8*  CREATININE 1.21 1.24  --   --  1.06 1.18  GFRNONAA >60 >60  --   --  >60 >60    LIVER FUNCTION TESTS: Recent Labs    07/23/22 0240 07/24/22 0500 07/25/22 0500 07/26/22 0240  BILITOT 0.7 0.7 0.8 0.4  AST 26 25 25 28   ALT  31 32 31 35  ALKPHOS 139* 140* 125 137*  PROT 6.9 6.9 6.8 6.7  ALBUMIN 2.2* 1.9* 2.1* 1.8*    TUMOR MARKERS: No results for input(s): "AFPTM", "CEA", "CA199", "CHROMGRNA" in the last 8760 hours.  Assessment and Plan:  Right empyema Planned for chest tube drain placement in IR 4/25 LD Eliquis 4/22 pm Hold x 48 hrs Plavix is ok per Dr Loreta Ave Risks and benefits of chest tube placement were discussed with the patient including bleeding, infection, damage to adjacent structures, malfunction of the tube requiring additional procedures and sepsis.  All of the patient's questions were answered, patient is agreeable to proceed. Consent signed and in chart.   Thank you for this interesting consult.  I greatly enjoyed meeting Morris Longenecker and look forward to participating in their care.  A copy of this report was sent to the requesting provider on this date.  Electronically Signed: Robet Leu, PA-C 07/26/2022, 11:03 AM   I spent a total of 40 Minutes    in face to face in clinical consultation, greater than 50% of which was counseling/coordinating care for Rt chest tube drain

## 2022-07-26 NOTE — Progress Notes (Signed)
[facility] Progress Note  Subjective: pt transferred to [facility] for ortho eval for osteo of foot. 2400 of UOP on po lasix - sodium stable at 126  Vitals:   07/25/22 1806 07/25/22 2145 07/26/22 0358 07/26/22 0818  BP:  (!) 142/101 135/89   Pulse:    78  Resp:  Temp: 99.1 F (37.3 C) 98.4 F (36.9 C) 98.5 F (36.9 C)   TempSrc: Oral Oral Oral   SpO2:  95% 98% 98%  Weight:      Height:        Exam: Gen alert, nad, calm, looks much better w/ regards to resp status but now anxious for other reasons -   upset-  feels that he is being mismanaged from an ortho standpoint-  first was upset that they wanted to do transmet-  then when they modified to only a ray now is not healing Chest minimal crackles at bases, mostly clear RRR no MRG Abd soft ntnd no mass or ascites +bs GU normal male MS no joint effusions or deformity Ext residual trace to 1+ pretib edema bilat  L>R-  it is the right that has a poorly healing ray amputation  Neuro is alert, Ox 3 , nf      Date                            Creat               eGFR  2019- 2020                 0.99- 1.24  2021                           0.97- 1.20  2022                           1.32  Jan- June 2023          0.96- 1.35        > 60   Jul- dec 2023             1.40- 1.73        45- 50 ml/min    4/06                            1.38, admit        4/10                            1.32                 > 60 ml/min  4/15                            1.17  4/17                            1.42                 57 ml/min  4/18                            1.46  55 ml/min       CXR 4/13 - IMPRESSION: Ill-defined opacity at the RIGHT lung base, atelectasis versus pneumonia. Ground-glass opacities within the LEFT upper lung, suspicious for multifocal pneumonia.    CXR 4/17 - IMPRESSION: Increasing right basilar infiltrate with associated effusion. (My read = +vasc congestion)    UNa < 10, UOsm 298   UCreat 108     Assessment/ Plan: Hyponatremia - hypervolemic hyponatremia. UNa is low c/w CHF exacerbation. Didn't respond to tolvaptan 15mg  x 1.  Na+ improved however w/ continued diuresis, up to 130 , then down to Na + down to 124,  stopped IV lasix, back up to 126 and stable on PO lasix. Urine osm c/w hypervolemic hyponatremia as urine osm is pretty approp-  need to resume some lasix to keep urine dilute and continue fluid restriction.  Sodium stable for now AKI on CKD 3a - b/l creat 1.3- 1.7 from 2023, 45- >60 ml/min. Creat here stable and now normal Vol overload - sig improvement in SOB and edema. Wt's not down, not sure if accurate.  Need to resume lasix on 4/22-   40 PO daily.  May be having some third spacing with low albumin as well Back pain - main c/o the last couple of days, has had MRI imaging w/ no report of osteo/ discitis, etc. 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    Cecille Aver  07/26/2022, 8:49 AM  Recent Labs  Lab 07/25/22 0500 07/26/22 0240  HGB 9.5* 10.1*  ALBUMIN 2.1* 1.8*  CALCIUM 8.4* 8.8*  PHOS 3.6 3.1  CREATININE 1.06 1.18  K 3.8 4.0   Recent Labs  Lab 07/22/22 0306  IRON 16*  TIBC 199*  FERRITIN 393*   Inpatient medications:  apixaban  5 mg Oral BID   budesonide (PULMICORT) nebulizer solution  0.5 mg Nebulization BID   busPIRone  10 mg Oral BID   carvedilol  3.125 mg Oral BID   Chlorhexidine Gluconate Cloth  6 each Topical Daily   clopidogrel  75 mg Oral Q  breakfast   digoxin  125 mcg Oral Daily   famotidine  20 mg Oral BID   furosemide  40 mg Oral Daily   insulin aspart  0-20 Units Subcutaneous TID WC   insulin glargine-yfgn  20 Units Subcutaneous Daily   lidocaine  1 patch Transdermal Daily   magnesium oxide  400 mg Oral BID   nystatin  5 mL Oral QID   polyethylene glycol  17 g Oral BID   senna-docusate  2 tablet Oral BID   sodium chloride flush  10-40 mL Intracatheter Q12H    DAPTOmycin (CUBICIN) 650 mg in sodium chloride 0.9 % IVPB Stopped (07/25/22 2351)   ferric gluconate (FERRLECIT) IVPB Stopped (07/25/22 1755)   acetaminophen **OR** acetaminophen, gadobutrol, Glycerin (Adult), HYDROmorphone (DILAUDID) injection, hydrOXYzine, ipratropium-albuterol, magic mouthwash w/lidocaine, methocarbamol, Muscle Rub, ondansetron **OR** ondansetron (ZOFRAN)  IV, mouth rinse, oxyCODONE, sodium chloride flush, sorbitol, milk of mag, mineral oil, glycerin (SMOG) enema

## 2022-07-26 NOTE — Progress Notes (Signed)
PHARMACY - PHYSICIAN COMMUNICATION CRITICAL VALUE ALERT - BLOOD CULTURE IDENTIFICATION (BCID)  David [name] is an 62 y.o. 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 ID is following/managing  Name of physician (or Provider) Contacted: Dow Adolph, MD  Current antibiotics: Daptomycin  Changes to prescribed antibiotics recommended:  Patient is on recommended antibiotics - No changes needed  Results for orders placed or performed during the hospital encounter of 07/12/22  Blood Culture ID Panel (Reflexed) (Collected: 07/24/2022  3:55 PM)  Result Value Ref Range   Enterococcus faecalis NOT DETECTED NOT DETECTED   Enterococcus Faecium NOT DETECTED NOT DETECTED   Listeria monocytogenes NOT DETECTED NOT DETECTED   Staphylococcus species DETECTED (A) NOT DETECTED   Staphylococcus aureus (BCID) DETECTED (A) NOT DETECTED   Staphylococcus epidermidis NOT DETECTED NOT DETECTED   Staphylococcus lugdunensis NOT DETECTED NOT DETECTED   Streptococcus species NOT DETECTED NOT DETECTED   Streptococcus agalactiae NOT DETECTED NOT DETECTED   Streptococcus pneumoniae NOT DETECTED NOT DETECTED   Streptococcus pyogenes NOT DETECTED NOT DETECTED   A.calcoaceticus-baumannii NOT DETECTED NOT DETECTED   Bacteroides fragilis NOT DETECTED NOT DETECTED   Enterobacterales NOT DETECTED NOT DETECTED   Enterobacter cloacae complex NOT DETECTED NOT DETECTED   Escherichia coli NOT DETECTED NOT DETECTED   Klebsiella aerogenes NOT DETECTED NOT DETECTED   Klebsiella oxytoca NOT DETECTED NOT DETECTED   Klebsiella pneumoniae NOT DETECTED NOT DETECTED   Proteus species NOT DETECTED NOT DETECTED   Salmonella species NOT DETECTED NOT DETECTED   Serratia marcescens NOT  DETECTED NOT DETECTED   Haemophilus influenzae NOT DETECTED NOT DETECTED   Neisseria meningitidis NOT DETECTED NOT DETECTED   Pseudomonas aeruginosa NOT DETECTED NOT DETECTED   Stenotrophomonas maltophilia NOT DETECTED NOT DETECTED   Candida albicans NOT DETECTED NOT DETECTED   Candida auris NOT DETECTED NOT DETECTED   Candida glabrata NOT DETECTED NOT DETECTED   Candida krusei NOT DETECTED NOT DETECTED   Candida parapsilosis NOT DETECTED NOT DETECTED   Candida tropicalis NOT DETECTED NOT DETECTED   Cryptococcus neoformans/gattii NOT DETECTED NOT DETECTED   Meth resistant mecA/C and MREJ DETECTED (A) NOT DETECTED    Signe Colt 07/26/2022  12:20 AM

## 2022-07-26 NOTE — Progress Notes (Signed)
[facility] PROGRESS NOTE    Progress Note  David [name]  ZOX:[id number] DOB: Mar 13, 1961 DOA: 07/12/2022 PCP: [name], MD     Brief Narrative:   David [name] is an 62 y.o. 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 [name], essential hypertension, came into the ED with nonproductive cough shortness of breath and fever 105 was found to be septic due to MRSA bacteremia, due to severe back pain MRI of the T-spine and L-spine was done that showed nonspecific finding no osteomyelitis or discitis 2D echo showed a TEE without vegetation ID recommended 2 weeks of antibiotics given his financial situation he was given erythromycin.  Nephrology was consulted and he was started on Lasix twice a day and tolvaptan as he was hyponatremic.  MRI of the right foot was done that was concerning for osteomyelitis orthopedic surgery and infectious disease were reengage, they have recommended IV daptomycin.  He hypokalemia back pain repeated MRI of the lumbar and thoracic spine showed discitis and osteomyelitis of T8-T9 with some fluid collection anteriorly to T7 T9 concerning for multiloculated prevertebral abscess, enhancing material posterior to the thecal sac on T8-T9 concerning for phlegmon causing significant mass effect of the thecal sac surgery recommended transfer the patient to [facility] for further evaluation of his acute osteomyelitis and neurosurgery evaluation. Neurosurgery evaluated the patient recommended to consult IR for possible drainage and IV antibiotics.  Pulmonary was also consulted for concern of empyema recommended IR to be consulted for chest tube placement   Assessment/Plan:   Sepsis due to MRSA bacteremia in the setting of acute right foot infection with T8-T9 discitis and osteomyelitis  with loculated peripherally enhancing fluid collection of T7-T9 concerning for abscess and T9 epidural phlegmon, he also has a large right pleural effusion concerning for empyema: Blood cultures positive on 07/12/2022, on admission he was started on IV Vanc Rocephin and azithromycin. Surveillance blood cultures on 07/13/2022 are negative. ESR 87, CRP 15. Neurosurgery recommends consult IR for catheter drainage Pulmonary was consulted for evaluation empyema recommended to consult interventional radiology as the patient might benefit from chest tube placement with lytic therapy. He was given erythromycin 07/18/2022, IV daptomycin was added on 07/24/2022. Repeated blood cultures on 07/24/2022 have remain negative till date.  Right foot ulcer with osteomyelitis of the residual fifth metatarsal diaphysis: New osteomyelitis at the site of prior amputation.  He significant improvement. MRI was done that showed acute osteomyelitis of the residual fifth metatarsal diaphysis extending to the level to the proximal metaphysis. ID was reconsulted who evaluated the patient this morning and was started on IV daptomycin. Orthopedic surgery was consulted recommended transfer to [facility] for further evaluation.  Severe back pain due to acute T8-T9 osteomyelitis with loculated abscess: Neurosurgery was consulted who evaluated the patient and recommended no surgical intervention from their standpoint at this point in time they recommended IV antibiotics and consult IR for drainage. Discontinue narcotics and Flexeril for pain.  Also started on IV hydromorphone as needed. Started on MiraLAX p.o. twice daily.  Hypervolemic hyponatremia: Admission sodium 121 nephrology was consulted, the patient was started on tolvaptan. Sodium improved then transition to oral Lasix. He is negative about 19 L. Sodium has returned close to baseline of 1 26-1 30. Nephrology has now signed off.  Essential hypertension: Antihypertensive  medications held on admission due  to hypotension. Blood pressure slowly improving he was restarted back on his Coreg and Lasix.  Chronic kidney disease stage III A/metabolic acidosis  Hypomagnesemia: Replete orally, still low replete orally recheck in the morning.  Hyperbilirubinemia: Did not infection now resolved.  Severe protein caloric malnutrition: With an albumin of 1.8. Continue Ensure 3 times daily. Likely due to infectious etiology chronic.  Chronic atrial fibrillation: Continue Eliquis and Coreg currently rate controlled.  Acute on chronic combined systolic and diastolic heart failure: Started on IV diuresis is negative about 19 L now on oral Lasix. Continue Coreg, continue to hold Sherryll Burger can be restarted once more stable. Will need to follow-up with cardiology as an outpatient.  Acute confusional state: Melatonin at night Seroquel as needed.  Normocytic anemia: No signs of overt bleeding likely due to acute on chronic infection.  Insulin-dependent diabetes mellitus type 2 poorly controlled with hyperglycemia: Continue long-acting insulin plus sliding scale CBGs every 4 patient is currently n.p.o. for procedures.    DVT prophylaxis: Lvenox Family Communication:None Status is: Inpatient     Code Status:     Code Status Orders  (From admission, onward)           Start     Ordered   07/12/22 1403  Full code  Continuous       Question:  By:  Answer:  Consent: discussion documented in EHR   07/12/22 1405           Code Status History     Date Active Date Inactive Code Status Order ID Comments User Context   12/28/2021 1444 12/28/2021 2343 Full Code [id number]  Nada Libman, MD Inpatient   08/30/2021 0123 09/07/2021 0113 Full Code [id number]  Therisa Doyne, MD Inpatient   08/26/2019 1838 08/30/2019 2127 Full Code [id number]  Anselm Jungling, DO ED   10/17/2018 1157 10/20/2018 1515 Full Code [id number]  Clydie Braun, MD Inpatient   09/26/2017 1743  09/30/2017 1728 Full Code [id number]  Wendall Stade, MD Inpatient         IV Access:   Peripheral IV   Procedures and diagnostic studies:   MR THORACIC SPINE W WO CONTRAST  Result Date: 07/25/2022 CLINICAL DATA:  Follow-up MR for MRSA bacteremia and concern for seeding EXAM: MRI THORACIC AND LUMBAR SPINE WITHOUT AND WITH CONTRAST TECHNIQUE: Multiplanar and multiecho pulse sequences of the thoracic and lumbar spine were obtained without and with intravenous contrast. CONTRAST:  8mL GADAVIST GADOBUTROL 1 MMOL/ML IV SOLN COMPARISON:  07/13/2022 MRI thoracic and lumbar spine FINDINGS: MRI THORACIC SPINE FINDINGS Alignment: Mild scoliosis and exaggeration of the normal thoracic kyphosis. No significant listhesis. Vertebrae: Decreased T1 signal, increased T2 signal, and contrast enhancement throughout the T8 and T11 vertebral bodies, with increased fluid signal and enhancement in the T8-T9 disc space (series 21, image 12 and series 25, image 12), which is new from the prior exam. No acute fracture or suspicious osseous lesion. Cord: T1 hypointense, contrast enhancing material along the anterior aspect of the thecal sac posterior to T8 and T9 (series 23 and 26, images 24-27), most likely phlegmon. There is likely phlegmon along the right aspect of the thecal sac posterior to T9 (series 25, image 11), although this area is not covered well on the axial sequences. This does not cause significant mass effect on the thecal sac. Prominent epidural fat also does not cause significant mass effect on the thecal sac. No abnormal spinal cord enhancement. Paraspinal and other soft tissues: Anterior to the  T7-T9 vertebral bodies, there is a loculated, peripherally enhancing fluid collection, which measures up to 3.1 x 5.9 x 5.9 cm (AP x TR x CC) (series 26, image 23 and 24; series 25, image 5), concerning for multiloculated prevertebral abscess. In addition, there is a large right pleural effusion with a thick  enhancing rim, concerning for empyema (series 26, image 23). Disc levels: Mild degenerative changes, without significant spinal canal stenosis or neural foraminal narrowing. MRI LUMBAR SPINE FINDINGS Segmentation:  5 lumbar type vertebral bodies. Alignment:  Mild levoscoliosis.  No significant listhesis. Vertebrae: No acute fracture, evidence of discitis, suspicious osseous lesion, or abnormal osseous enhancement. Conus medullaris: Extends to the L1 level and appears normal. No evidence of epidural collection. No abnormal enhancement. Paraspinal and other soft tissues: No acute finding. Redemonstrated increased T2 signal in the paraspinous musculature (series 2, images 3 and 9), without abnormal in his is no focal fluid collection in the paraspinous muscles. Disc levels: Degenerative changes are stable from the 07/13/2022 exam. Please see that report for detailed findings. IMPRESSION: 1. Findings concerning for discitis osteomyelitis at T8-T9, with a loculated, peripherally enhancing fluid collection anterior to the T7-T9 vertebral bodies, concerning for a multiloculated prevertebral abscess. 2. Enhancing material along the anterior aspect of the thecal sac posterior to T8 and T9, and likely along the right aspect of the thecal sac at T9, concerning for epidural phlegmon, which does not cause significant mass effect on the thecal sac. 3. Large right pleural effusion with a thick enhancing rim, concerning for empyema. 4. No evidence of discitis or osteomyelitis in the lumbar spine. These results will be called to the ordering clinician or representative by the Radiologist Assistant, and communication documented in the PACS or [employer]. Electronically Signed   By: Wiliam Ke M.D.   On: 07/25/2022 16:55   MR Lumbar Spine W Wo Contrast  Result Date: 07/25/2022 CLINICAL DATA:  Follow-up MR for MRSA bacteremia and concern for seeding EXAM: MRI THORACIC AND LUMBAR SPINE WITHOUT AND WITH CONTRAST TECHNIQUE:  Multiplanar and multiecho pulse sequences of the thoracic and lumbar spine were obtained without and with intravenous contrast. CONTRAST:  8mL GADAVIST GADOBUTROL 1 MMOL/ML IV SOLN COMPARISON:  07/13/2022 MRI thoracic and lumbar spine FINDINGS: MRI THORACIC SPINE FINDINGS Alignment: Mild scoliosis and exaggeration of the normal thoracic kyphosis. No significant listhesis. Vertebrae: Decreased T1 signal, increased T2 signal, and contrast enhancement throughout the T8 and T11 vertebral bodies, with increased fluid signal and enhancement in the T8-T9 disc space (series 21, image 12 and series 25, image 12), which is new from the prior exam. No acute fracture or suspicious osseous lesion. Cord: T1 hypointense, contrast enhancing material along the anterior aspect of the thecal sac posterior to T8 and T9 (series 23 and 26, images 24-27), most likely phlegmon. There is likely phlegmon along the right aspect of the thecal sac posterior to T9 (series 25, image 11), although this area is not covered well on the axial sequences. This does not cause significant mass effect on the thecal sac. Prominent epidural fat also does not cause significant mass effect on the thecal sac. No abnormal spinal cord enhancement. Paraspinal and other soft tissues: Anterior to the T7-T9 vertebral bodies, there is a loculated, peripherally enhancing fluid collection, which measures up to 3.1 x 5.9 x 5.9 cm (AP x TR x CC) (series 26, image 23 and 24; series 25, image 5), concerning for multiloculated prevertebral abscess. In addition, there is a large right pleural effusion with  a thick enhancing rim, concerning for empyema (series 26, image 23). Disc levels: Mild degenerative changes, without significant spinal canal stenosis or neural foraminal narrowing. MRI LUMBAR SPINE FINDINGS Segmentation:  5 lumbar type vertebral bodies. Alignment:  Mild levoscoliosis.  No significant listhesis. Vertebrae: No acute fracture, evidence of discitis,  suspicious osseous lesion, or abnormal osseous enhancement. Conus medullaris: Extends to the L1 level and appears normal. No evidence of epidural collection. No abnormal enhancement. Paraspinal and other soft tissues: No acute finding. Redemonstrated increased T2 signal in the paraspinous musculature (series 2, images 3 and 9), without abnormal in his is no focal fluid collection in the paraspinous muscles. Disc levels: Degenerative changes are stable from the 07/13/2022 exam. Please see that report for detailed findings. IMPRESSION: 1. Findings concerning for discitis osteomyelitis at T8-T9, with a loculated, peripherally enhancing fluid collection anterior to the T7-T9 vertebral bodies, concerning for a multiloculated prevertebral abscess. 2. Enhancing material along the anterior aspect of the thecal sac posterior to T8 and T9, and likely along the right aspect of the thecal sac at T9, concerning for epidural phlegmon, which does not cause significant mass effect on the thecal sac. 3. Large right pleural effusion with a thick enhancing rim, concerning for empyema. 4. No evidence of discitis or osteomyelitis in the lumbar spine. These results will be called to the ordering clinician or representative by the Radiologist Assistant, and communication documented in the PACS or [employer]. Electronically Signed   By: Wiliam Ke M.D.   On: 07/25/2022 16:55   MR FOOT RIGHT W WO CONTRAST  Result Date: 07/24/2022 CLINICAL DATA:  Diabetic foot wound EXAM: MRI OF THE RIGHT FOREFOOT WITHOUT AND WITH CONTRAST TECHNIQUE: Multiplanar, multisequence MR imaging of the right forefoot was performed before and after the administration of intravenous contrast. CONTRAST:  8mL GADAVIST GADOBUTROL 1 MMOL/ML IV SOLN COMPARISON:  X-ray 07/13/2022 FINDINGS: Bones/Joint/Cartilage Postsurgical changes from fifth ray resection at the level of the distal fifth metatarsal diaphysis. There is intense bone marrow edema and enhancement  of the residual fifth metatarsal diaphysis extending to the level of the proximal metaphysis (series 6, image 15). Confluent low T1 signal changes near the resection margin are compatible with acute osteomyelitis. Prior fourth ray resection at the level of the distal fourth metatarsal diaphysis without evidence of osteomyelitis. Remaining osseous structures are intact. No additional sites of osteomyelitis. No fracture or dislocation. Ligaments Intact Lisfranc ligament. Remaining collateral ligaments are intact. Muscles and Tendons Amputation changes to the musculotendinous structures at the lateral forefoot. Diffuse edema-like intramuscular signal which may be related to denervation or myositis. No tenosynovitis. Soft tissues Soft tissue wound or ulceration at the lateral aspect of the forefoot near the residual fifth metatarsal with enhancing sinus tract extending to the underlying resection margin (series 9, images 10-13). Subcutaneous edema of the dorsal forefoot. No organized or drainable fluid collections. IMPRESSION: 1. Postsurgical changes from fifth ray resection to the level of the distal fifth metatarsal diaphysis. 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 2. Soft tissue wound or ulceration at the lateral aspect of the forefoot near the residual fifth metatarsal with enhancing sinus tract extending to the underlying resection margin. 3. Prior fourth ray resection at the level of the distal fourth metatarsal diaphysis without evidence of osteomyelitis. 4. Diffuse edema-like intramuscular signal which may be related to denervation or myositis. Electronically Signed   By: Duanne Guess D.O.   On: 07/24/2022 15:58  DG Chest 2 View  Result Date: 07/24/2022 CLINICAL DATA:  Follow-up. Worsening dyspnea. Mild productive cough EXAM: CHEST - 2 VIEW COMPARISON:  07/20/2022 x-ray FINDINGS: Stable right-sided PICC. Persistent moderate right  effusion and adjacent opacities. Increasing opacity left lung base. No pneumothorax. Stable cardiopericardial silhouette. Increasing vascular congestion. Tortuous aorta. Dilated stomach with air-fluid level seen beneath the left hemidiaphragm. Please correlate with any symptoms IMPRESSION: Increasing pleural effusion on the right and bilateral lung base opacities. Increasing vascular congestion Dilated stomach with an air-fluid level beneath the left hemidiaphragm. Please correlate with any symptoms and additional workup as clinically appropriate Electronically Signed   By: Karen Kays M.D.   On: 07/24/2022 15:36     Medical Consultants:   None.   Subjective:    David [name] no compalins  Objective:    Vitals:   07/25/22 1152 07/25/22 1806 07/25/22 2145 07/26/22 0358  BP: 127/86  (!) 142/101 135/89  Pulse: 67     Resp: 16  18 17   Temp: 98 F (36.7 C) 99.1 F (37.3 C) 98.4 F (36.9 C) 98.5 F (36.9 C)  TempSrc: Oral Oral Oral Oral  SpO2: 98%  95% 98%  Weight:      Height:       SpO2: 98 % O2 Flow Rate (L/min): 6 L/min FiO2 (%): 21 %   Intake/Output Summary (Last 24 hours) at 07/26/2022 0740 Last data filed at 07/26/2022 0200 Gross per 24 hour  Intake 685.86 ml  Output 2400 ml  Net -1714.14 ml   Filed Weights   07/23/22 0700 07/24/22 0500 07/25/22 0500  Weight: 84.4 kg 83.9 kg 84.5 kg    Exam: General exam: In no acute distress. Respiratory system: Good air movement and clear to auscultation. Cardiovascular system: S1 & S2 heard, RRR. No JVD.  Gastrointestinal system: Abdomen is nondistended, soft and nontender.  Extremities: No pedal edema. Skin: No rashes, lesions or ulcers Psychiatry: Judgement and insight appear normal. Mood & affect appropriate.    Data Reviewed:    Labs: Basic Metabolic Panel: Recent Labs  Lab 07/22/22 0306 07/23/22 0240 07/24/22 0500 07/24/22 1555 07/24/22 1700 07/25/22 0500 07/26/22 0240  NA 125* 130* 124* 128* 126* 126*  126*  K 4.0 3.9 4.2  --   --  3.8 4.0  CL 98 99 94*  --   --  95* 90*  CO2 20* 24 23  --   --  23 24  GLUCOSE 174* 206* 179*  --   --  209* 258*  BUN 24* 21* 15  --   --  14 11  CREATININE 1.23 1.21 1.24  --   --  1.06 1.18  CALCIUM 8.3* 8.5* 8.5*  --   --  8.4* 8.8*  MG 1.9 2.1 1.7  --   --  1.7 1.5*  PHOS 3.0 3.6 3.0  --   --  3.6 3.1   GFR Estimated Creatinine Clearance: 73.1 mL/min (by C-G formula based on SCr of 1.18 mg/dL). Liver Function Tests: Recent Labs  Lab 07/22/22 0306 07/23/22 0240 07/24/22 0500 07/25/22 0500 07/26/22 0240  AST 25 26 25 25 28   ALT 29 31 32 31 35  ALKPHOS 130* 139* 140* 125 137*  BILITOT 1.0 0.7 0.7 0.8 0.4  PROT 6.5 6.9 6.9 6.8 6.7  ALBUMIN 2.1* 2.2* 1.9* 2.1* 1.8*   No results for input(s): "LIPASE", "AMYLASE" in the last 168 hours. No results for input(s): "AMMONIA" in the last 168 hours. Coagulation profile Recent Labs  Lab 07/26/22 0625  INR 1.7*   COVID-19 Labs  Recent Labs    07/24/22 1555  CRP 15.1*    Lab Results  Component Value Date   SARSCOV2NAA NEGATIVE 07/12/2022   SARSCOV2NAA NEGATIVE 07/09/2022   SARSCOV2NAA NEGATIVE 08/29/2021   SARSCOV2NAA NEGATIVE 08/26/2019    CBC: Recent Labs  Lab 07/22/22 0306 07/23/22 0240 07/24/22 0500 07/25/22 0500 07/26/22 0240  WBC 10.4 9.0 12.5* 9.6 9.7  NEUTROABS 7.9* 6.4 9.6* 7.4 7.1  HGB 10.0* 10.1* 10.1* 9.5* 10.1*  HCT 30.0* 30.1* 30.5* 28.5* 29.0*  MCV 91.5 92.0 92.1 91.3 88.7  PLT 328 366 376 318 328   Cardiac Enzymes: No results for input(s): "CKTOTAL", "CKMB", "CKMBINDEX", "TROPONINI" in the last 168 hours. BNP (last 3 results) No results for input(s): "PROBNP" in the last 8760 hours. CBG: Recent Labs  Lab 07/25/22 0759 07/25/22 1149 07/25/22 1620 07/25/22 2200 07/26/22 0606  GLUCAP 167* 214* 98 173* 264*   D-Dimer: No results for input(s): "DDIMER" in the last 72 hours. Hgb A1c: No results for input(s): "HGBA1C" in the last 72 hours. Lipid  Profile: No results for input(s): "CHOL", "HDL", "LDLCALC", "TRIG", "CHOLHDL", "LDLDIRECT" in the last 72 hours. Thyroid function studies: No results for input(s): "TSH", "T4TOTAL", "T3FREE", "THYROIDAB" in the last 72 hours.  Invalid input(s): "FREET3" Anemia work up: No results for input(s): "VITAMINB12", "FOLATE", "FERRITIN", "TIBC", "IRON", "RETICCTPCT" in the last 72 hours. Sepsis Labs: Recent Labs  Lab 07/23/22 0240 07/24/22 0500 07/25/22 0500 07/26/22 0240  PROCALCITON  --   --  0.13  --   WBC 9.0 12.5* 9.6 9.7  LATICACIDVEN  --   --  0.8  --    Microbiology Recent Results (from the past 240 hour(s))  Culture, blood (Routine X 2) w Reflex to ID Panel     Status: None (Preliminary result)   Collection Time: 07/24/22  3:55 PM   Specimen: BLOOD LEFT ARM  Result Value Ref Range Status   Specimen Description   Final    BLOOD LEFT ARM Performed at [facility], [address], [place], [place] [address]    Special Requests   Final    AEROBIC BOTTLE ONLY Blood Culture adequate volume Performed at [facility], [address], [place], [place] [address]    Culture  Setup Time   Final    GRAM POSITIVE COCCI IN CLUSTERS AEROBIC BOTTLE ONLY CRITICAL RESULT CALLED TO, READ BACK BY AND VERIFIED WITH: PHARMD T. JOHNSON 07/26/22 @ 0016 BY AB Performed at [facility], [address], [place], [place] [address]    Culture GRAM POSITIVE COCCI  Final   Report Status PENDING  Incomplete  Culture, blood (Routine X 2) w Reflex to ID Panel     Status: None (Preliminary result)   Collection Time: 07/24/22  3:55 PM   Specimen: BLOOD  Result Value Ref Range Status   Specimen Description   Final    BLOOD BLOOD RIGHT ARM Performed at [facility], [address] [place], [place] [address]    Special Requests   Final    AEROBIC BOTTLE ONLY Blood Culture adequate volume Performed at [facility], [address]  [place], [place] [address]    Culture  Setup Time   Final    GRAM POSITIVE COCCI IN CLUSTERS AEROBIC BOTTLE ONLY CRITICAL VALUE NOTED.  VALUE IS CONSISTENT WITH PREVIOUSLY REPORTED AND CALLED VALUE. CRITICAL RESULT CALLED TO, READ BACK BY AND  VERIFIED WITH: PHARMD M.BELL AT 1315 ON 07/25/2022 BY T.SAAD. Performed at [facility], [address], [place], [place] [address]    Culture GRAM POSITIVE COCCI  Final   Report Status PENDING  Incomplete  Blood Culture ID Panel (Reflexed)     Status: Abnormal   Collection Time: 07/24/22  3:55 PM  Result Value Ref Range Status   Enterococcus faecalis NOT DETECTED NOT DETECTED Final   Enterococcus Faecium NOT DETECTED NOT DETECTED Final   Listeria monocytogenes NOT DETECTED NOT DETECTED Final   Staphylococcus species DETECTED (A) NOT DETECTED Final    Comment: CRITICAL RESULT CALLED TO, READ BACK BY AND VERIFIED WITH: PHARMD T. JOHNSON 07/26/22 @ 0016 BY AB    Staphylococcus aureus (BCID) DETECTED (A) NOT DETECTED Final    Comment: Methicillin (oxacillin)-resistant Staphylococcus aureus (MRSA). MRSA is predictably resistant to beta-lactam antibiotics (except ceftaroline). Preferred therapy is vancomycin unless clinically contraindicated. Patient requires contact precautions if  hospitalized. CRITICAL RESULT CALLED TO, READ BACK BY AND VERIFIED WITH: PHARMD T. JOHNSON 07/26/22 @ 0016 BY AB    Staphylococcus epidermidis NOT DETECTED NOT DETECTED Final   Staphylococcus lugdunensis NOT DETECTED NOT DETECTED Final   Streptococcus species NOT DETECTED NOT DETECTED Final   Streptococcus agalactiae NOT DETECTED NOT DETECTED Final   Streptococcus pneumoniae NOT DETECTED NOT DETECTED Final   Streptococcus pyogenes NOT DETECTED NOT DETECTED Final   A.calcoaceticus-baumannii NOT DETECTED NOT DETECTED Final   Bacteroides fragilis NOT DETECTED NOT DETECTED Final   Enterobacterales NOT DETECTED NOT DETECTED Final   Enterobacter cloacae complex NOT DETECTED  NOT DETECTED Final   Escherichia coli NOT DETECTED NOT DETECTED Final   Klebsiella aerogenes NOT DETECTED NOT DETECTED Final   Klebsiella oxytoca NOT DETECTED NOT DETECTED Final   Klebsiella pneumoniae NOT DETECTED NOT DETECTED Final   Proteus species NOT DETECTED NOT DETECTED Final   Salmonella species NOT DETECTED NOT DETECTED Final   Serratia marcescens NOT DETECTED NOT DETECTED Final   Haemophilus influenzae NOT DETECTED NOT DETECTED Final   Neisseria meningitidis NOT DETECTED NOT DETECTED Final   Pseudomonas aeruginosa NOT DETECTED NOT DETECTED Final   Stenotrophomonas maltophilia NOT DETECTED NOT DETECTED Final   Candida albicans NOT DETECTED NOT DETECTED Final   Candida auris NOT DETECTED NOT DETECTED Final   Candida glabrata NOT DETECTED NOT DETECTED Final   Candida krusei NOT DETECTED NOT DETECTED Final   Candida parapsilosis NOT DETECTED NOT DETECTED Final   Candida tropicalis NOT DETECTED NOT DETECTED Final   Cryptococcus neoformans/gattii NOT DETECTED NOT DETECTED Final   Meth resistant mecA/C and MREJ DETECTED (A) NOT DETECTED Final    Comment: CRITICAL RESULT CALLED TO, READ BACK BY AND VERIFIED WITH: PHARMD T. JOHNSON 07/26/22 @ 0016 BY AB Performed at [facility], [address], [place], [place] [address]      Medications:    apixaban  5 mg Oral BID   budesonide (PULMICORT) nebulizer solution  0.5 mg Nebulization BID   busPIRone  10 mg Oral BID   carvedilol  3.125 mg Oral BID   Chlorhexidine Gluconate Cloth  6 each Topical Daily   clopidogrel  75 mg Oral Q breakfast   digoxin  125 mcg Oral Daily   famotidine  20 mg Oral BID   furosemide  40 mg Oral Daily   insulin aspart  0-20 Units Subcutaneous TID WC   insulin glargine-yfgn  20 Units Subcutaneous Daily   lidocaine  1 patch Transdermal Daily   nystatin  5 mL Oral QID   polyethylene glycol  17 g Oral BID   senna-docusate  2 tablet Oral BID   sodium chloride flush  10-40 mL Intracatheter Q12H    Continuous Infusions:  DAPTOmycin (CUBICIN) 650 mg in sodium chloride 0.9 % IVPB Stopped (07/25/22 2351)   ferric gluconate (FERRLECIT) IVPB Stopped (07/25/22 1755)      LOS: 14 days   Marinda Elk  [facility]  07/26/2022, 7:40 AM

## 2022-07-26 NOTE — Consult Note (Signed)
ORTHOPAEDIC CONSULTATION  REQUESTING PHYSICIAN: Marinda Elk, MD  Chief Complaint: Ulceration right foot and  back pain.  HPI: David [name] is a 62 y.o. male who presents with osteomyelitis fifth metatarsal right foot.  Patient initially underwent a fourth ray amputation a year ago.  Patient then subsequently underwent further surgery in [facility].  Patient is status post endovascular reconstruction with Dr. Myra Gianotti in September of last year.  Past Medical History:  Diagnosis Date   Cardiomyopathy    a. EF 45% in 2019.   CHF (congestive heart failure)    Chronic anticoagulation 09/30/2017   Diabetes mellitus type 2 in nonobese    Does not have health insurance    Essential hypertension 09/26/2017   Financial difficulties    H/O noncompliance with medical treatment, presenting hazards to health    Non-insulin treated type 2 diabetes mellitus 09/26/2017   Persistent atrial fibrillation    Sleep apnea suspected 09/30/2017   Past Surgical History:  Procedure Laterality Date   ABDOMINAL AORTOGRAM W/LOWER EXTREMITY N/A 09/02/2021   Procedure: ABDOMINAL AORTOGRAM W/LOWER EXTREMITY;  Surgeon: Nada Libman, MD;  Location: [facility];  Service: Cardiovascular;  Laterality: N/A;   ABDOMINAL AORTOGRAM W/LOWER EXTREMITY N/A 12/28/2021   Procedure: ABDOMINAL AORTOGRAM W/LOWER EXTREMITY;  Surgeon: Nada Libman, MD;  Location: [facility];  Service: Cardiovascular;  Laterality: N/A;   AMPUTATION Right 09/04/2021   Procedure: 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;  Surgeon: Nadara Mustard, MD;  Location: [facility] OR;  Service: Orthopedics;  Laterality: Right;   APPENDECTOMY  1971   CARDIOVERSION N/A 09/28/2017   Procedure: CARDIOVERSION;  Surgeon: Thurmon Fair, MD;  Location: [facility] ENDOSCOPY;  Service: Cardiovascular;  Laterality: N/A;   PERIPHERAL VASCULAR BALLOON ANGIOPLASTY  09/02/2021   Procedure: PERIPHERAL VASCULAR BALLOON ANGIOPLASTY;  Surgeon: Nada Libman, MD;  Location: [facility];  Service: Cardiovascular;;   PERIPHERAL VASCULAR BALLOON ANGIOPLASTY  12/28/2021   Procedure: PERIPHERAL VASCULAR BALLOON ANGIOPLASTY;  Surgeon: Nada Libman, MD;  Location: [facility];  Service: Cardiovascular;;  Posterior Tibial PTA only   TEE WITHOUT CARDIOVERSION N/A 09/28/2017   Procedure: TRANSESOPHAGEAL ECHOCARDIOGRAM (TEE);  Surgeon: Thurmon Fair, MD;  Location: [facility] ENDOSCOPY;  Service: Cardiovascular;  Laterality: N/A;   TEE WITHOUT CARDIOVERSION N/A 09/03/2021   Procedure: TRANSESOPHAGEAL ECHOCARDIOGRAM (TEE);  Surgeon: Chilton Si, MD;  Location: [facility] ENDOSCOPY;  Service: Cardiovascular;  Laterality: N/A;   TEE WITHOUT CARDIOVERSION N/A 07/15/2022   Procedure: TRANSESOPHAGEAL ECHOCARDIOGRAM;  Surgeon: Thurmon Fair, MD;  Location: [facility];  Service: Cardiovascular;  Laterality: N/A;   Social History   Socioeconomic History   Marital status: Married    Spouse name: Not on file   Number of children: Not on file   Years of education: Not on file   Highest education level: Not on file  Occupational History   Not on file  Tobacco Use   Smoking status: Former    Years: 15    Types: Cigarettes    Quit date: 03/18/2018    Years since quitting: 4.3   Smokeless tobacco: Never   Tobacco comments:    09/26/2017 "2-3 cigarettes/month now"  Vaping Use   Vaping Use: Never used  Substance and Sexual Activity   Alcohol use: Not Currently    Alcohol/week: 3.0 standard drinks of alcohol    Types: 3 Cans of beer per week    Comment: States he quit drinking 8-9 months ago   Drug  use: Never   Sexual activity: Not Currently  Other Topics Concern   Not on file  Social History Narrative   Not on file   Social Determinants of Health   Financial Resource Strain: Not on file  Food Insecurity: No Food Insecurity (07/12/2022)   Hunger Vital Sign    Worried About Running Out of Food in the Last Year: Never true    Ran Out of Food in the Last Year:  Never true  Transportation Needs: No Transportation Needs (07/12/2022)   PRAPARE - Administrator, Civil Service (Medical): No    Lack of Transportation (Non-Medical): No  Physical Activity: Not on file  Stress: Stress Concern Present (09/26/2017)   [employer] of Occupational Health - Occupational Stress Questionnaire    Feeling of Stress : Very much  Social Connections: Not on file   Family History  Problem Relation Age of Onset   Hypertension Mother    - negative except otherwise stated in the family history section No Known Allergies Prior to Admission medications   Medication Sig Start Date End Date Taking? Authorizing Provider  apixaban (ELIQUIS) 5 MG TABS tablet Take 1 tablet (5 mg total) by mouth 2 (two) times daily. 05/10/21  Yes Tereso Newcomer T, PA-C  aspirin EC 81 MG tablet Take 1 tablet (81 mg total) by mouth daily. Swallow whole. 09/07/21  Yes Burnadette Pop, MD  atorvastatin (LIPITOR) 80 MG tablet Take 1 tablet (80 mg total) by mouth daily. 05/17/22  Yes [name], MD  busPIRone (BUSPAR) 10 MG tablet Take 1 tablet (10 mg total) by mouth 2 (two) times daily. Patient taking differently: Take 10 mg by mouth daily as needed (For anxiety). 12/16/21  Yes Georgian Co M, PA-C  carvedilol (COREG) 6.25 MG tablet Take 1 tablet (6.25 mg total) by mouth 2 (two) times daily. 07/01/22  Yes Sharlene Dory, PA-C  clopidogrel (PLAVIX) 75 MG tablet Take 1 tablet (75 mg total) by mouth daily with breakfast. 04/13/22  Yes Newlin, Enobong, MD  digoxin (LANOXIN) 0.125 MG tablet Take 1 tablet (125 mcg total) by mouth once daily. 06/03/22  Yes Wendall Stade, MD  famotidine (PEPCID) 20 MG tablet Take 1 tablet (20 mg total) by mouth 2 times daily. 03/24/22  Yes   fluticasone (FLONASE) 50 MCG/ACT nasal spray Place 2 sprays into both nostrils daily. 02/01/22  Yes [name], MD  furosemide (LASIX) 40 MG tablet Take 1 tablet (40 mg total) by mouth daily. 05/17/22  Yes [name], MD  Insulin Glargine (BASAGLAR KWIKPEN) 100 UNIT/ML Inject 40 Units into the skin daily. 04/13/22  Yes Newlin, Enobong, MD  insulin lispro (HUMALOG KWIKPEN) 100 UNIT/ML KwikPen Inject 15 Units into the skin 3 (three) times daily. 04/13/22  Yes Newlin, Odette Horns, MD  sacubitril-valsartan (ENTRESTO) 49-51 MG Take 1 tablet by mouth 2 (two) times daily. Needs to see cardiology for refills Patient taking differently: Take 1 tablet by mouth 2 (two) times daily. 01/04/22  Yes Claiborne Rigg, NP  spironolactone (ALDACTONE) 25 MG tablet Take 1 tablet (25 mg total) by mouth daily. 12/16/21 07/20/22 Yes McClung, Marzella Schlein, PA-C  HYDROcodone-acetaminophen (NORCO/VICODIN) 5-325 MG tablet Take 1 tablet by mouth every 4 (four) hours as needed for up to 7 days for Moderate pain (4-6) or Severe pain (7-10). Patient not taking: Reported on 07/12/2022 03/24/22      MR THORACIC SPINE W WO CONTRAST  Result Date: 07/25/2022 CLINICAL DATA:  Follow-up MR for MRSA bacteremia and  concern for seeding EXAM: MRI THORACIC AND LUMBAR SPINE WITHOUT AND WITH CONTRAST TECHNIQUE: Multiplanar and multiecho pulse sequences of the thoracic and lumbar spine were obtained without and with intravenous contrast. CONTRAST:  8mL GADAVIST GADOBUTROL 1 MMOL/ML IV SOLN COMPARISON:  07/13/2022 MRI thoracic and lumbar spine FINDINGS: MRI THORACIC SPINE FINDINGS Alignment: Mild scoliosis and exaggeration of the normal thoracic kyphosis. No significant listhesis. Vertebrae: Decreased T1 signal, increased T2 signal, and contrast enhancement throughout the T8 and T11 vertebral bodies, with increased fluid signal and enhancement in the T8-T9 disc space (series 21, image 12 and series 25, image 12), which is new from the prior exam. No acute fracture or suspicious osseous lesion. Cord: T1 hypointense, contrast enhancing material along the anterior aspect of the thecal sac posterior to T8 and T9 (series 23 and 26, images 24-27), most likely phlegmon. There is  likely phlegmon along the right aspect of the thecal sac posterior to T9 (series 25, image 11), although this area is not covered well on the axial sequences. This does not cause significant mass effect on the thecal sac. Prominent epidural fat also does not cause significant mass effect on the thecal sac. No abnormal spinal cord enhancement. Paraspinal and other soft tissues: Anterior to the T7-T9 vertebral bodies, there is a loculated, peripherally enhancing fluid collection, which measures up to 3.1 x 5.9 x 5.9 cm (AP x TR x CC) (series 26, image 23 and 24; series 25, image 5), concerning for multiloculated prevertebral abscess. In addition, there is a large right pleural effusion with a thick enhancing rim, concerning for empyema (series 26, image 23). Disc levels: Mild degenerative changes, without significant spinal canal stenosis or neural foraminal narrowing. MRI LUMBAR SPINE FINDINGS Segmentation:  5 lumbar type vertebral bodies. Alignment:  Mild levoscoliosis.  No significant listhesis. Vertebrae: No acute fracture, evidence of discitis, suspicious osseous lesion, or abnormal osseous enhancement. Conus medullaris: Extends to the L1 level and appears normal. No evidence of epidural collection. No abnormal enhancement. Paraspinal and other soft tissues: No acute finding. Redemonstrated increased T2 signal in the paraspinous musculature (series 2, images 3 and 9), without abnormal in his is no focal fluid collection in the paraspinous muscles. Disc levels: Degenerative changes are stable from the 07/13/2022 exam. Please see that report for detailed findings. IMPRESSION: 1. Findings concerning for discitis osteomyelitis at T8-T9, with a loculated, peripherally enhancing fluid collection anterior to the T7-T9 vertebral bodies, concerning for a multiloculated prevertebral abscess. 2. Enhancing material along the anterior aspect of the thecal sac posterior to T8 and T9, and likely along the right aspect of the  thecal sac at T9, concerning for epidural phlegmon, which does not cause significant mass effect on the thecal sac. 3. Large right pleural effusion with a thick enhancing rim, concerning for empyema. 4. No evidence of discitis or osteomyelitis in the lumbar spine. These results will be called to the ordering clinician or representative by the Radiologist Assistant, and communication documented in the PACS or [employer]. Electronically Signed   By: Wiliam Ke M.D.   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 CONTRAST:  8mL GADAVIST GADOBUTROL 1 MMOL/ML IV SOLN COMPARISON:  07/13/2022 MRI thoracic and lumbar spine FINDINGS: MRI  THORACIC SPINE FINDINGS Alignment: Mild scoliosis and exaggeration of the normal thoracic kyphosis. No significant listhesis. Vertebrae: Decreased T1 signal, increased T2 signal, and contrast enhancement throughout the T8 and T11 vertebral bodies, with increased fluid signal and enhancement in the T8-T9 disc space (series 21, image 12 and series 25, image 12), which is new from the prior exam. No acute fracture or suspicious osseous lesion. Cord: T1 hypointense, contrast enhancing material along the anterior aspect of the thecal sac posterior to T8 and T9 (series 23 and 26, images 24-27), most likely phlegmon. There is likely phlegmon along the right aspect of the thecal sac posterior to T9 (series 25, image 11), although this area is not covered well on the axial sequences. This does not cause significant mass effect on the thecal sac. Prominent epidural fat also does not cause significant mass effect on the thecal sac. No abnormal spinal cord enhancement. Paraspinal and other  soft tissues: Anterior to the T7-T9 vertebral bodies, there is a loculated, peripherally enhancing fluid collection, which measures up to 3.1 x 5.9 x 5.9 cm (AP x TR x CC) (series 26, image 23 and 24; series 25, image 5), concerning for multiloculated prevertebral abscess. In addition, there is a large right pleural effusion with a thick enhancing rim, concerning for empyema (series 26, image 23). Disc levels: Mild degenerative changes, without significant spinal canal stenosis or neural foraminal narrowing. MRI LUMBAR SPINE FINDINGS Segmentation:  5 lumbar type vertebral bodies. Alignment:  Mild levoscoliosis.  No significant listhesis. Vertebrae: No acute fracture, evidence of discitis, suspicious osseous lesion, or abnormal osseous enhancement. Conus medullaris: Extends to the L1 level and appears normal. No evidence of epidural collection. No abnormal enhancement. Paraspinal and other soft tissues: No acute finding. Redemonstrated increased T2 signal in the paraspinous musculature (series 2, images 3 and 9), without abnormal in his is no focal fluid collection in the paraspinous muscles. Disc levels: Degenerative changes are stable from the 07/13/2022 exam. Please see that report for detailed findings. IMPRESSION: 1. Findings concerning for discitis osteomyelitis at T8-T9, with a loculated, peripherally enhancing fluid collection anterior to the T7-T9 vertebral bodies, concerning for a multiloculated prevertebral abscess. 2. Enhancing material along the anterior aspect of the thecal sac posterior to T8 and T9, and likely along the right aspect of the thecal sac at T9, concerning for epidural phlegmon, which does not cause significant mass effect on the thecal sac. 3. Large right pleural effusion with a thick enhancing rim, concerning for empyema. 4. No evidence of discitis or osteomyelitis in the lumbar spine. These results will be called to the ordering clinician or representative by the Radiologist Assistant,  and communication documented in the PACS or [employer]. Electronically Signed   By: Wiliam Ke M.D.   On: 07/25/2022 16:55   MR FOOT RIGHT W WO CONTRAST  Result Date: 07/24/2022 CLINICAL DATA:  Diabetic foot wound EXAM: MRI OF THE RIGHT FOREFOOT WITHOUT AND WITH CONTRAST TECHNIQUE: Multiplanar, multisequence MR imaging of the right forefoot was performed before and after the administration of intravenous contrast. CONTRAST:  8mL GADAVIST GADOBUTROL 1 MMOL/ML IV SOLN COMPARISON:  X-ray 07/13/2022 FINDINGS: Bones/Joint/Cartilage Postsurgical changes from fifth ray resection at the level of the distal fifth metatarsal diaphysis. There is intense bone marrow edema and enhancement of the residual fifth metatarsal diaphysis extending to the level of the proximal metaphysis (series 6, image 15). Confluent low T1 signal changes near the resection margin are compatible with acute osteomyelitis. Prior fourth ray resection at the  level of the distal fourth metatarsal diaphysis without evidence of osteomyelitis. Remaining osseous structures are intact. No additional sites of osteomyelitis. No fracture or dislocation. Ligaments Intact Lisfranc ligament. Remaining collateral ligaments are intact. Muscles and Tendons Amputation changes to the musculotendinous structures at the lateral forefoot. Diffuse edema-like intramuscular signal which may be related to denervation or myositis. No tenosynovitis. Soft tissues Soft tissue wound or ulceration at the lateral aspect of the forefoot near the residual fifth metatarsal with enhancing sinus tract extending to the underlying resection margin (series 9, images 10-13). Subcutaneous edema of the dorsal forefoot. No organized or drainable fluid collections. IMPRESSION: 1. Postsurgical changes from fifth ray resection to the level of the distal fifth metatarsal diaphysis. Acute osteomyelitis of the residual fifth metatarsal diaphysis extending to the level of the proximal  metaphysis. 2. Soft tissue wound or ulceration at the lateral aspect of the forefoot near the residual fifth metatarsal with enhancing sinus tract extending to the underlying resection margin. 3. Prior fourth ray resection at the level of the distal fourth metatarsal diaphysis without evidence of osteomyelitis. 4. Diffuse edema-like intramuscular signal which may be related to denervation or myositis. Electronically Signed   By: Duanne Guess D.O.   On: 07/24/2022 15:58   DG Chest 2 View  Result Date: 07/24/2022 CLINICAL DATA:  Follow-up. Worsening dyspnea. Mild productive cough EXAM: CHEST - 2 VIEW COMPARISON:  07/20/2022 x-ray FINDINGS: Stable right-sided PICC. Persistent moderate right effusion and adjacent opacities. Increasing opacity left lung base. No pneumothorax. Stable cardiopericardial silhouette. Increasing vascular congestion. Tortuous aorta. Dilated stomach with air-fluid level seen beneath the left hemidiaphragm. Please correlate with any symptoms IMPRESSION: Increasing pleural effusion on the right and bilateral lung base opacities. Increasing vascular congestion Dilated stomach with an air-fluid level beneath the left hemidiaphragm. Please correlate with any symptoms and additional workup as clinically appropriate Electronically Signed   By: Karen Kays M.D.   On: 07/24/2022 15:36   - pertinent xrays, CT, MRI studies were reviewed and independently interpreted  Positive ROS: All other systems have been reviewed and were otherwise negative with the exception of those mentioned in the HPI and as above.  Physical Exam: General: Alert, no acute distress Psychiatric: Patient is competent for consent with normal mood and affect Lymphatic: No axillary or cervical lymphadenopathy Cardiovascular: No pedal edema Respiratory: No cyanosis, no use of accessory musculature GI: No organomegaly, abdomen is soft and non-tender    Images:  @ENCIMAGES @  Labs:  Lab Results  Component Value  Date   HGBA1C 9.1 (H) 07/13/2022   HGBA1C 8.8 (H) 12/14/2021   HGBA1C 11.2 (H) 08/30/2021   ESRSEDRATE 87 (H) 07/24/2022   ESRSEDRATE 14 11/12/2021   ESRSEDRATE 9 10/22/2021   CRP 15.1 (H) 07/24/2022   CRP 1.4 11/12/2021   CRP 1.3 10/22/2021   REPTSTATUS PENDING 07/24/2022   REPTSTATUS PENDING 07/24/2022   CULT GRAM POSITIVE COCCI 07/24/2022   CULT GRAM POSITIVE COCCI 07/24/2022   LABORGA METHICILLIN RESISTANT STAPHYLOCOCCUS AUREUS 07/12/2022    Lab Results  Component Value Date   ALBUMIN 1.8 (L) 07/26/2022   ALBUMIN 2.1 (L) 07/25/2022   ALBUMIN 1.9 (L) 07/24/2022   PREALBUMIN 16.6 (L) 08/30/2021        Latest Ref Rng & Units 07/26/2022    2:40 AM 07/25/2022    5:00 AM 07/24/2022    5:00 AM  CBC EXTENDED  WBC 4.0 - 10.5 K/uL 9.7  9.6  12.5   RBC 4.22 - 5.81 MIL/uL 3.27  3.12  3.31   Hemoglobin 13.0 - 17.0 g/dL 21.3  9.5  08.6   HCT 57.8 - 52.0 % 29.0  28.5  30.5   Platelets 150 - 400 K/uL 328  318  376   NEUT# 1.7 - 7.7 K/uL 7.1  7.4  9.6   Lymph# 0.7 - 4.0 K/uL 1.2  0.9  1.3     Neurologic: Patient does not have protective sensation bilateral lower extremities.   MUSCULOSKELETAL:   Skin: Examination patient has a draining ulcer over the fifth metatarsal right foot there is no surrounding cellulitis.  Review of the MRI scan shows extensive osteomyelitis of the fifth metatarsal.  Patient does not have a palpable dorsalis pedis pulse.  ABI in October of last year showed monophasic flow with ABI 0.83 on the right and 0.66 on the left.  Albumin 1.8 with a white cell count of 9.7.  Hemoglobin A1c 9.1 with a sed rate of 87.  MRI scan of the thoracic spine shows loculated fluid collection along the thoracic spine.  Assessment: Assessment: Uncontrolled type 2 diabetes with severe protein caloric malnutrition with peripheral vascular disease and osteomyelitis of the fifth metatarsal right foot.  Plan: Will order ankle-brachial indices.  I have consulted Dr. Myra Gianotti  to see if vascular intervention is necessary.  Patient will require 5th ray amputation.      Thank you for the consult and the opportunity to see Mr. Daneen Schick, MD [facility] [phone] 8:40 AM

## 2022-07-26 NOTE — Progress Notes (Signed)
[facility]   Reason for visit: Follow up on bacteremia  Interval History: transferred here to [facility] for further work up.  Repeat blood cultures 4/21 again positive.  Now MRI positive for thoracic discitis, osteomyelitis.  CXR with pleural effusion/empyema.     Physical Exam: Constitutional:  Vitals:   07/26/22 0943 07/26/22 1255  BP: 128/88 (!) 144/107  Pulse:  85  Resp: (!) 23 19  Temp:  97.8 F (36.6 C)  SpO2:  92%   patient appears in NAD, anixous Respiratory: Normal respiratory effort  Review of Systems: Constitutional: negative for fevers and chills  Lab Results  Component Value Date   WBC 9.7 07/26/2022   HGB 10.1 (L) 07/26/2022   HCT 29.0 (L) 07/26/2022   MCV 88.7 07/26/2022   PLT 328 07/26/2022    Lab Results  Component Value Date   CREATININE 1.18 07/26/2022   BUN 11 07/26/2022   NA 126 (L) 07/26/2022   K 4.0 07/26/2022   CL 90 (L) 07/26/2022   CO2 24 07/26/2022    Lab Results  Component Value Date   ALT 35 07/26/2022   AST 28 07/26/2022   ALKPHOS 137 (H) 07/26/2022     Microbiology: Recent Results (from the past 240 hour(s))  Culture, blood (Routine X 2) w Reflex to ID Panel     Status: Abnormal (Preliminary result)   Collection Time: 07/24/22  3:55 PM   Specimen: BLOOD LEFT ARM  Result Value Ref Range Status   Specimen Description   Final    BLOOD LEFT ARM Performed at [facility], [address] [place], [place] [address]    Special Requests   Final    AEROBIC BOTTLE ONLY Blood Culture adequate volume Performed at [facility], [address] [place], [place] [address]    Culture  Setup Time   Final    GRAM POSITIVE COCCI IN CLUSTERS AEROBIC BOTTLE ONLY CRITICAL RESULT CALLED TO, READ BACK BY AND VERIFIED WITH: PHARMD T. JOHNSON 07/26/22 @ 0016 BY AB    Culture (A)  Final    METHICILLIN RESISTANT STAPHYLOCOCCUS AUREUS SUSCEPTIBILITIES TO FOLLOW Performed at [facility], [address], [place], [place] [address]    Report Status PENDING  Incomplete  Culture, blood (Routine X 2) w Reflex to ID Panel     Status: Abnormal (Preliminary result)   Collection Time: 07/24/22  3:55 PM   Specimen: BLOOD  Result Value Ref Range Status   Specimen Description   Final    BLOOD BLOOD RIGHT ARM Performed at [facility], [address] [place], [place] [address]    Special Requests   Final    AEROBIC BOTTLE ONLY Blood Culture adequate volume Performed at [facility], [address], [place], [place] [address]    Culture  Setup Time   Final    GRAM POSITIVE COCCI IN CLUSTERS AEROBIC BOTTLE ONLY CRITICAL VALUE NOTED.  VALUE IS CONSISTENT WITH PREVIOUSLY REPORTED AND CALLED VALUE. CRITICAL RESULT CALLED TO, READ BACK BY AND VERIFIED WITH: PHARMD M.BELL AT 1315 ON 07/25/2022 BY T.SAAD. Performed at [facility], [address] [place], [place] [address]    Culture STAPHYLOCOCCUS AUREUS (A)  Final   Report Status PENDING  Incomplete  Blood Culture ID Panel (Reflexed)     Status: Abnormal   Collection Time: 07/24/22  3:55 PM  Result Value Ref Range Status   Enterococcus faecalis  NOT DETECTED NOT DETECTED Final   Enterococcus Faecium NOT DETECTED NOT DETECTED Final   Listeria monocytogenes NOT DETECTED NOT DETECTED Final   Staphylococcus species DETECTED (A) NOT DETECTED Final    Comment: CRITICAL RESULT CALLED TO, READ BACK BY AND VERIFIED WITH: PHARMD T. JOHNSON 07/26/22 @ 0016 BY AB    Staphylococcus aureus (BCID) DETECTED (A) NOT DETECTED Final    Comment: Methicillin (oxacillin)-resistant Staphylococcus aureus (MRSA). MRSA is predictably resistant to beta-lactam antibiotics (except ceftaroline). Preferred therapy is vancomycin unless clinically contraindicated. Patient requires contact precautions if  hospitalized. CRITICAL RESULT CALLED TO, READ BACK BY AND VERIFIED WITH: PHARMD T. JOHNSON 07/26/22 @ 0016 BY AB     Staphylococcus epidermidis NOT DETECTED NOT DETECTED Final   Staphylococcus lugdunensis NOT DETECTED NOT DETECTED Final   Streptococcus species NOT DETECTED NOT DETECTED Final   Streptococcus agalactiae NOT DETECTED NOT DETECTED Final   Streptococcus pneumoniae NOT DETECTED NOT DETECTED Final   Streptococcus pyogenes NOT DETECTED NOT DETECTED Final   A.calcoaceticus-baumannii NOT DETECTED NOT DETECTED Final   Bacteroides fragilis NOT DETECTED NOT DETECTED Final   Enterobacterales NOT DETECTED NOT DETECTED Final   Enterobacter cloacae complex NOT DETECTED NOT DETECTED Final   Escherichia coli NOT DETECTED NOT DETECTED Final   Klebsiella aerogenes NOT DETECTED NOT DETECTED Final   Klebsiella oxytoca NOT DETECTED NOT DETECTED Final   Klebsiella pneumoniae NOT DETECTED NOT DETECTED Final   Proteus species NOT DETECTED NOT DETECTED Final   Salmonella species NOT DETECTED NOT DETECTED Final   Serratia marcescens NOT DETECTED NOT DETECTED Final   Haemophilus influenzae NOT DETECTED NOT DETECTED Final   Neisseria meningitidis NOT DETECTED NOT DETECTED Final   Pseudomonas aeruginosa NOT DETECTED NOT DETECTED Final   Stenotrophomonas maltophilia NOT DETECTED NOT DETECTED Final   Candida albicans NOT DETECTED NOT DETECTED Final   Candida auris NOT DETECTED NOT DETECTED Final   Candida glabrata NOT DETECTED NOT DETECTED Final   Candida krusei NOT DETECTED NOT DETECTED Final   Candida parapsilosis NOT DETECTED NOT DETECTED Final   Candida tropicalis NOT DETECTED NOT DETECTED Final   Cryptococcus neoformans/gattii NOT DETECTED NOT DETECTED Final   Meth resistant mecA/C and MREJ DETECTED (A) NOT DETECTED Final    Comment: CRITICAL RESULT CALLED TO, READ BACK BY AND VERIFIED WITH: PHARMD T. JOHNSON 07/26/22 @ 0016 BY AB Performed at [facility], [address], [place], [place] [address]     Impression/Plan:  1. Disseminated MRSA infection - will change his antibiotics to vancomycin now  with pulmonary disease.  Will repeat blood cultures in 1-2 days.    2.  Empyema - IR to drain on 4/25.  On antibiotics as above.  3.  Thoracic discitis - he will need prolonged treatment, ideally with prolonged IV treatment before consideration of transition to oral therapy.

## 2022-07-26 NOTE — Consult Note (Addendum)
Hospital Consult    Reason for Consult:  right fifth metatarsal osteomyelitis Requesting Physician:  Aldean Baker MD MRN #:  [id number]  History of Present Illness: David [name] is a 62 y.o. male with a PMH of DMII, CHF, HTN, persistent a-fib on Eliquis, and PAD who presented to the hospital on 07/12/2022 with shortness of breath, fever, back and chest wall pain, cough, decreased appetite for about 6 days.  He was found to have recurrent MRSA bacteremia with no vegetations on TEE.  The patient had persistent fevers with unknown source of infection.  He obtained right foot and lumbar/thoracic spine MRIs on 4/21 and 4/22.  Right foot MRI demonstrated acute osteomyelitis of the residual fifth metatarsal diaphysis extending to the proximal metaphysis.  MRI of the spine demonstrated possible T8-T9 discitis osteomyelitis and concern for T7-T9 prevertebral abscess.  Neurosurgery, orthopedics, and vascular surgery were consulted.  Currently neurosurgery recommends IV antibiotics for discitis.  Orthopedics is recommending a right fifth ray amputation.  We were consulted to determine whether the patient has enough blood flow to adequately heal a right fifth amputation.  He has previously underwent right lower extremity angiogram on 09/02/2021 with right posterior tibial artery angioplasty by Dr.Dwyane Dupree, so he could heal 4th ray amputation by Dr. [name] on 6/3.  Unfortunately the patient did not heal the surgery and underwent repeat RLE angiogram on 12/28/2021 with repeat posterior tibial artery angioplasty.  He then had a 5th metatarsal amputation with [facility] podiatry on 03/20/2022.   His last angiogram on 12/28/2021 demonstrated patent right common femoral, profundofemoral, and SFA with mild calcification at the adductor canal.  He also had a patent popliteal artery with single-vessel runoff via peroneal and reconstitution of the posterior tibial at the ankle.  At bedside today the patient is frustrated.   He denies any pain or drainage from his right foot.  He is agreeable to angiogram.  He states that he may be okay with further right foot amputation, but " would rather die" than get a below knee amputation.  The pt is on a statin for cholesterol management.  The pt is on a daily aspirin.   Other AC:  plavix, eliquis The pt is on carvedilol, entresto for hypertension.   The pt is diabetic.   Tobacco hx:  former  Past Medical History:  Diagnosis Date   Cardiomyopathy    a. EF 45% in 2019.   CHF (congestive heart failure)    Chronic anticoagulation 09/30/2017   Diabetes mellitus type 2 in nonobese    Does not have health insurance    Essential hypertension 09/26/2017   Financial difficulties    H/O noncompliance with medical treatment, presenting hazards to health    Non-insulin treated type 2 diabetes mellitus 09/26/2017   Persistent atrial fibrillation    Sleep apnea suspected 09/30/2017    Past Surgical History:  Procedure Laterality Date   ABDOMINAL AORTOGRAM W/LOWER EXTREMITY N/A 09/02/2021   Procedure: ABDOMINAL AORTOGRAM W/LOWER EXTREMITY;  Surgeon: Nada Libman, MD;  Location: [facility];  Service: Cardiovascular;  Laterality: N/A;   ABDOMINAL AORTOGRAM W/LOWER EXTREMITY N/A 12/28/2021   Procedure: ABDOMINAL AORTOGRAM W/LOWER EXTREMITY;  Surgeon: Nada Libman, MD;  Location: [facility];  Service: Cardiovascular;  Laterality: N/A;   AMPUTATION Right 09/04/2021   Procedure: 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;  Surgeon: Nadara Mustard, MD;  Location: [facility] OR;  Service: Orthopedics;  Laterality: Right;   APPENDECTOMY  1971   CARDIOVERSION N/A  09/28/2017   Procedure: CARDIOVERSION;  Surgeon: Thurmon Fair, MD;  Location: [facility] ENDOSCOPY;  Service: Cardiovascular;  Laterality: N/A;   PERIPHERAL VASCULAR BALLOON ANGIOPLASTY  09/02/2021   Procedure: PERIPHERAL VASCULAR BALLOON ANGIOPLASTY;  Surgeon: Nada Libman, MD;  Location: [facility];  Service: Cardiovascular;;    PERIPHERAL VASCULAR BALLOON ANGIOPLASTY  12/28/2021   Procedure: PERIPHERAL VASCULAR BALLOON ANGIOPLASTY;  Surgeon: Nada Libman, MD;  Location: [facility];  Service: Cardiovascular;;  Posterior Tibial PTA only   TEE WITHOUT CARDIOVERSION N/A 09/28/2017   Procedure: TRANSESOPHAGEAL ECHOCARDIOGRAM (TEE);  Surgeon: Thurmon Fair, MD;  Location: [facility] ENDOSCOPY;  Service: Cardiovascular;  Laterality: N/A;   TEE WITHOUT CARDIOVERSION N/A 09/03/2021   Procedure: TRANSESOPHAGEAL ECHOCARDIOGRAM (TEE);  Surgeon: Chilton Si, MD;  Location: [facility] ENDOSCOPY;  Service: Cardiovascular;  Laterality: N/A;   TEE WITHOUT CARDIOVERSION N/A 07/15/2022   Procedure: TRANSESOPHAGEAL ECHOCARDIOGRAM;  Surgeon: Thurmon Fair, MD;  Location: [facility];  Service: Cardiovascular;  Laterality: N/A;    No Known Allergies  Prior to Admission medications   Medication Sig Start Date End Date Taking? Authorizing Provider  apixaban (ELIQUIS) 5 MG TABS tablet Take 1 tablet (5 mg total) by mouth 2 (two) times daily. 05/10/21  Yes Tereso Newcomer T, PA-C  aspirin EC 81 MG tablet Take 1 tablet (81 mg total) by mouth daily. Swallow whole. 09/07/21  Yes Burnadette Pop, MD  atorvastatin (LIPITOR) 80 MG tablet Take 1 tablet (80 mg total) by mouth daily. 05/17/22  Yes [name], MD  busPIRone (BUSPAR) 10 MG tablet Take 1 tablet (10 mg total) by mouth 2 (two) times daily. Patient taking differently: Take 10 mg by mouth daily as needed (For anxiety). 12/16/21  Yes Georgian Co M, PA-C  carvedilol (COREG) 6.25 MG tablet Take 1 tablet (6.25 mg total) by mouth 2 (two) times daily. 07/01/22  Yes Sharlene Dory, PA-C  clopidogrel (PLAVIX) 75 MG tablet Take 1 tablet (75 mg total) by mouth daily with breakfast. 04/13/22  Yes Newlin, Enobong, MD  digoxin (LANOXIN) 0.125 MG tablet Take 1 tablet (125 mcg total) by mouth once daily. 06/03/22  Yes Wendall Stade, MD  famotidine (PEPCID) 20 MG tablet Take 1 tablet (20 mg total)  by mouth 2 times daily. 03/24/22  Yes   fluticasone (FLONASE) 50 MCG/ACT nasal spray Place 2 sprays into both nostrils daily. 02/01/22  Yes [name], MD  furosemide (LASIX) 40 MG tablet Take 1 tablet (40 mg total) by mouth daily. 05/17/22  Yes [name], MD  Insulin Glargine (BASAGLAR KWIKPEN) 100 UNIT/ML Inject 40 Units into the skin daily. 04/13/22  Yes Newlin, Enobong, MD  insulin lispro (HUMALOG KWIKPEN) 100 UNIT/ML KwikPen Inject 15 Units into the skin 3 (three) times daily. 04/13/22  Yes Newlin, Odette Horns, MD  sacubitril-valsartan (ENTRESTO) 49-51 MG Take 1 tablet by mouth 2 (two) times daily. Needs to see cardiology for refills Patient taking differently: Take 1 tablet by mouth 2 (two) times daily. 01/04/22  Yes Claiborne Rigg, NP  spironolactone (ALDACTONE) 25 MG tablet Take 1 tablet (25 mg total) by mouth daily. 12/16/21 07/20/22 Yes McClung, Marzella Schlein, PA-C  HYDROcodone-acetaminophen (NORCO/VICODIN) 5-325 MG tablet Take 1 tablet by mouth every 4 (four) hours as needed for up to 7 days for Moderate pain (4-6) or Severe pain (7-10). Patient not taking: Reported on 07/12/2022 03/24/22       Social History   Socioeconomic History   Marital status: Married    Spouse name: Not  on file   Number of children: Not on file   Years of education: Not on file   Highest education level: Not on file  Occupational History   Not on file  Tobacco Use   Smoking status: Former    Years: 15    Types: Cigarettes    Quit date: 03/18/2018    Years since quitting: 4.3   Smokeless tobacco: Never   Tobacco comments:    09/26/2017 "2-3 cigarettes/month now"  Vaping Use   Vaping Use: Never used  Substance and Sexual Activity   Alcohol use: Not Currently    Alcohol/week: 3.0 standard drinks of alcohol    Types: 3 Cans of beer per week    Comment: States he quit drinking 8-9 months ago   Drug use: Never   Sexual activity: Not Currently  Other Topics Concern   Not on file  Social History  Narrative   Not on file   Social Determinants of Health   Financial Resource Strain: Not on file  Food Insecurity: No Food Insecurity (07/12/2022)   Hunger Vital Sign    Worried About Running Out of Food in the Last Year: Never true    Ran Out of Food in the Last Year: Never true  Transportation Needs: No Transportation Needs (07/12/2022)   PRAPARE - Administrator, Civil Service (Medical): No    Lack of Transportation (Non-Medical): No  Physical Activity: Not on file  Stress: Stress Concern Present (09/26/2017)   [employer] of Occupational Health - Occupational Stress Questionnaire    Feeling of Stress : Very much  Social Connections: Not on file  Intimate Partner Violence: Not At Risk (07/12/2022)   Humiliation, Afraid, Rape, and Kick questionnaire    Fear of Current or Ex-Partner: No    Emotionally Abused: No    Physically Abused: No    Sexually Abused: No     Family History  Problem Relation Age of Onset   Hypertension Mother     ROS: [x]  Positive   [ ]  Negative   [ ]  All sytems reviewed and are negative  Cardiac: []  chest pain/pressure []  hx MI []  SOB   Vascular: []  pain in legs while walking []  pain in legs at rest []  pain in legs at night [x]  non-healing ulcers []  hx of DVT []  swelling in legs  Pulmonary: []  asthma/wheezing []  home O2  Neurologic: []  hx of CVA []  mini stroke   Hematologic: []  hx of cancer  Endocrine:   [x]  diabetes []  thyroid disease  GI []  GERD  GU: []  CKD/renal failure []  HD--[]  M/W/F or []  T/T/S  Psychiatric: []  anxiety []  depression  Musculoskeletal: []  arthritis []  joint pain  Integumentary: []  rashes []  ulcers  Constitutional: []  fever  []  chills  Physical Examination  Vitals:   07/26/22 0358 07/26/22 0818  BP: 135/89   Pulse:  78  Resp: 17 17  Temp: 98.5 F (36.9 C)   SpO2: 98% 98%   Body mass index is 25.27 kg/m.  General:  WDWN in NAD Gait: Not observed HENT: WNL,  normocephalic Pulmonary: normal non-labored breathing Cardiac: irregularly irregular a-fib Abdomen:  soft, NT; aortic pulse is non palpable Skin: without rashes Vascular Exam/Pulses: palpable femoral pulses bilaterally. right peroneal and PT doppler signals Extremities: right 5th metatarsal superficial wound with minimal surrounding erythema Musculoskeletal: no muscle wasting or atrophy  Neurologic: A&O X 3 Psychiatric:  The pt has Normal affect.    CBC    Component  Value Date/Time   WBC 9.7 07/26/2022 0240   RBC 3.27 (L) 07/26/2022 0240   HGB 10.1 (L) 07/26/2022 0240   HGB 17.3 06/16/2021 1658   HCT 29.0 (L) 07/26/2022 0240   HCT 47.0 06/16/2021 1658   PLT 328 07/26/2022 0240   PLT 244 06/16/2021 1658   MCV 88.7 07/26/2022 0240   MCV 91 06/16/2021 1658   MCH 30.9 07/26/2022 0240   MCHC 34.8 07/26/2022 0240   RDW 13.6 07/26/2022 0240   RDW 12.7 06/16/2021 1658   LYMPHSABS 1.2 07/26/2022 0240   LYMPHSABS 1.7 11/16/2018 1128   MONOABS 1.1 (H) 07/26/2022 0240   EOSABS 0.2 07/26/2022 0240   EOSABS 0.4 11/16/2018 1128   BASOSABS 0.1 07/26/2022 0240   BASOSABS 0.1 11/16/2018 1128    BMET    Component Value Date/Time   NA 126 (L) 07/26/2022 0240   NA 131 (L) 06/16/2021 1658   K 4.0 07/26/2022 0240   CL 90 (L) 07/26/2022 0240   CO2 24 07/26/2022 0240   GLUCOSE 258 (H) 07/26/2022 0240   BUN 11 07/26/2022 0240   BUN 21 06/16/2021 1658   CREATININE 1.18 07/26/2022 0240   CREATININE 1.73 (H) 11/12/2021 1643   CALCIUM 8.8 (L) 07/26/2022 0240   GFRNONAA >60 07/26/2022 0240   GFRAA >60 08/30/2019 0445    COAGS: Lab Results  Component Value Date   INR 1.7 (H) 07/26/2022   INR 1.2 08/30/2021   INR 1.13 09/26/2017     Non-Invasive Vascular Imaging:   ABIs pending Right foot MRI:   1. Postsurgical changes from fifth ray resection to the level of the distal fifth metatarsal diaphysis. Acute osteomyelitis of the residual fifth metatarsal diaphysis extending to the  level of the proximal metaphysis. 2. Soft tissue wound or ulceration at the lateral aspect of the forefoot near the residual fifth metatarsal with enhancing sinus tract extending to the underlying resection margin. 3. Prior fourth ray resection at the level of the distal fourth metatarsal diaphysis without evidence of osteomyelitis. 4. Diffuse edema-like intramuscular signal which may be related to denervation or myositis.  ASSESSMENT/PLAN: This is a 62 y.o. male with recurrent MRSA bacteremia with T8-T9 discitis and right fifth metatarsal osteomyelitis   -The patient has previously underwent fourth ray amputation and subsequently fifth ray amputation in 2023.  Both of these amputations have poorly healed, and he is now presenting with right fifth metatarsal osteomyelitis with MRSA bacteremia -ID is on board.  Patient is currently getting IV daptomycin -Orthopedic surgery is currently recommending further surgery on the right foot.  I have discussed with the patient he would likely benefit from repeat right lower extremity angiogram prior to further amputation to optimize blood flow.  Last angiogram in September 2023 demonstrated patent right common femoral and profundofemoral arteries with single vessel peroneal runoff and reconstitution of the posterior tibial artery at the ankle -Currently the patient is agreeable to right lower extremity angiogram. Further discussion pending with Dr.Elenna Spratling.  He is currently on Eliquis, and he received a dose last night.  We will have to hold this prior to his procedure.  I will discontinue his Eliquis for now.  He could potentially undergo angiogram on Thursday or Friday   Loel Dubonnet, PA-C [facility] [phone]   I agree with the above.  I have seen and evaluated the patient.  We have been consulted for atherosclerotic lower extremity vascular disease with a nonhealing right toe amputation and osteomyelitis.  The patient has  previously undergone angiography and had interventions on an occluded posterior tibial artery.  I am concerned that this has reoccluded which is leading to his inability to heal the wound.  I have recommended that we proceed with repeat angiography via left femoral approach and intervention on the right leg as indicated.  He is currently on Eliquis which was given last night.  This is on hold now.  We will plan for angiography on Thursday or Friday.  Dr. [name] is managing the foot.  Durene Cal

## 2022-07-26 NOTE — Progress Notes (Signed)
ABI's have been completed. Preliminary results can be found in CV Proc through chart review.   07/26/22 11:31 AM Olen Cordial RVT

## 2022-07-27 ENCOUNTER — Inpatient Hospital Stay (HOSPITAL_COMMUNITY): Payer: Medicaid Other

## 2022-07-27 DIAGNOSIS — N179 Acute kidney failure, unspecified: Secondary | ICD-10-CM

## 2022-07-27 DIAGNOSIS — A4102 Sepsis due to Methicillin resistant Staphylococcus aureus: Principal | ICD-10-CM

## 2022-07-27 DIAGNOSIS — J869 Pyothorax without fistula: Secondary | ICD-10-CM

## 2022-07-27 DIAGNOSIS — R7881 Bacteremia: Secondary | ICD-10-CM | POA: Insufficient documentation

## 2022-07-27 DIAGNOSIS — B9562 Methicillin resistant Staphylococcus aureus infection as the cause of diseases classified elsewhere: Secondary | ICD-10-CM

## 2022-07-27 DIAGNOSIS — M4644 Discitis, unspecified, thoracic region: Secondary | ICD-10-CM

## 2022-07-27 DIAGNOSIS — R6520 Severe sepsis without septic shock: Secondary | ICD-10-CM

## 2022-07-27 LAB — CULTURE, BLOOD (ROUTINE X 2)

## 2022-07-27 LAB — CBC WITH DIFFERENTIAL/PLATELET
Abs Immature Granulocytes: 0.06 10*3/uL (ref 0.00–0.07)
Basophils Absolute: 0.1 10*3/uL (ref 0.0–0.1)
Basophils Relative: 1 %
Eosinophils Absolute: 0.3 10*3/uL (ref 0.0–0.5)
Eosinophils Relative: 3 %
HCT: 27.7 % — ABNORMAL LOW (ref 39.0–52.0)
Hemoglobin: 9.5 g/dL — ABNORMAL LOW (ref 13.0–17.0)
Immature Granulocytes: 1 %
Lymphocytes Relative: 12 %
Lymphs Abs: 1.1 10*3/uL (ref 0.7–4.0)
MCH: 30.6 pg (ref 26.0–34.0)
MCHC: 34.3 g/dL (ref 30.0–36.0)
MCV: 89.4 fL (ref 80.0–100.0)
Monocytes Absolute: 1.3 10*3/uL — ABNORMAL HIGH (ref 0.1–1.0)
Monocytes Relative: 13 %
Neutro Abs: 6.7 10*3/uL (ref 1.7–7.7)
Neutrophils Relative %: 70 %
Platelets: 321 10*3/uL (ref 150–400)
RBC: 3.1 MIL/uL — ABNORMAL LOW (ref 4.22–5.81)
RDW: 13.7 % (ref 11.5–15.5)
WBC: 9.5 10*3/uL (ref 4.0–10.5)
nRBC: 0 % (ref 0.0–0.2)

## 2022-07-27 LAB — BASIC METABOLIC PANEL
Anion gap: 12 (ref 5–15)
BUN: 11 mg/dL (ref 6–20)
CO2: 25 mmol/L (ref 22–32)
Calcium: 8.9 mg/dL (ref 8.9–10.3)
Chloride: 90 mmol/L — ABNORMAL LOW (ref 98–111)
Creatinine, Ser: 1.13 mg/dL (ref 0.61–1.24)
GFR, Estimated: >60 mL/min (ref >60)
Glucose, Bld: 139 mg/dL — ABNORMAL HIGH (ref 70–99)
Potassium: 3.6 mmol/L (ref 3.5–5.1)
Sodium: 127 mmol/L — ABNORMAL LOW (ref 135–145)

## 2022-07-27 LAB — GLUCOSE, CAPILLARY
Glucose-Capillary: 246 mg/dL — ABNORMAL HIGH (ref 70–99)
Glucose-Capillary: 115 mg/dL — ABNORMAL HIGH (ref 70–99)
Glucose-Capillary: 167 mg/dL — ABNORMAL HIGH (ref 70–99)
Glucose-Capillary: 102 mg/dL — ABNORMAL HIGH (ref 70–99)

## 2022-07-27 LAB — MAGNESIUM: Magnesium: 1.6 mg/dL — ABNORMAL LOW (ref 1.7–2.4)

## 2022-07-27 MED ORDER — MAGNESIUM SULFATE 4 GM/100ML IV SOLN
4.0000 g | Freq: Once | INTRAVENOUS | Status: AC
  Administered 2022-07-27: 4 g via INTRAVENOUS
  Filled 2022-07-27: qty 100

## 2022-07-27 MED ORDER — FENTANYL CITRATE (PF) 100 MCG/2ML IJ SOLN
INTRAMUSCULAR | Status: AC
  Filled 2022-07-27: qty 2

## 2022-07-27 MED ORDER — FENTANYL CITRATE (PF) 100 MCG/2ML IJ SOLN
INTRAMUSCULAR | Status: AC | PRN
  Administered 2022-07-27 (×2): 50 ug via INTRAVENOUS

## 2022-07-27 NOTE — Progress Notes (Addendum)
  Progress Note    07/27/2022 8:19 AM 12 Days Post-Op  Subjective:  no complaints   Vitals:   07/27/22 0321 07/27/22 0728  BP: (!) 140/84 139/83  Pulse:  (!) 107  Resp: 15 16  Temp: 98.3 F (36.8 C) 98 F (36.7 C)  SpO2: 94% 95%   Physical Exam: Lungs:  labored with speaking Extremities:  dressing left in place R foot Abdomen:  soft, NT, ND Neurologic: a&O  CBC    Component Value Date/Time   WBC 9.7 07/26/2022 0240   RBC 3.27 (L) 07/26/2022 0240   HGB 10.1 (L) 07/26/2022 0240   HGB 17.3 06/16/2021 1658   HCT 29.0 (L) 07/26/2022 0240   HCT 47.0 06/16/2021 1658   PLT 328 07/26/2022 0240   PLT 244 06/16/2021 1658   MCV 88.7 07/26/2022 0240   MCV 91 06/16/2021 1658   MCH 30.9 07/26/2022 0240   MCHC 34.8 07/26/2022 0240   RDW 13.6 07/26/2022 0240   RDW 12.7 06/16/2021 1658   LYMPHSABS 1.2 07/26/2022 0240   LYMPHSABS 1.7 11/16/2018 1128   MONOABS 1.1 (H) 07/26/2022 0240   EOSABS 0.2 07/26/2022 0240   EOSABS 0.4 11/16/2018 1128   BASOSABS 0.1 07/26/2022 0240   BASOSABS 0.1 11/16/2018 1128    BMET    Component Value Date/Time   NA 126 (L) 07/26/2022 0240   NA 131 (L) 06/16/2021 1658   K 4.0 07/26/2022 0240   CL 90 (L) 07/26/2022 0240   CO2 24 07/26/2022 0240   GLUCOSE 258 (H) 07/26/2022 0240   BUN 11 07/26/2022 0240   BUN 21 06/16/2021 1658   CREATININE 1.18 07/26/2022 0240   CREATININE 1.73 (H) 11/12/2021 1643   CALCIUM 8.8 (L) 07/26/2022 0240   GFRNONAA >60 07/26/2022 0240   GFRAA >60 08/30/2019 0445    INR    Component Value Date/Time   INR 1.7 (H) 07/26/2022 0625     Intake/Output Summary (Last 24 hours) at 07/27/2022 0819 Last data filed at 07/26/2022 2122 Gross per 24 hour  Intake 470 ml  Output 1100 ml  Net -630 ml     Assessment/Plan:  62 y.o. male with osteomyelitis R fifth metatarsal 12 Days Post-Op   Continue to hold Eliquis  Plan is for repeat angiography RLE either tomorrow or Friday IV antibiotics per ID for thoracic  discitis and empyema Npo past midnight   Emilie Rutter, PA-C [facility] [phone] 07/27/2022 8:19 AM  Plan for angiography tomorrow.  N.p.o. after midnight.  Durene Cal

## 2022-07-27 NOTE — Progress Notes (Signed)
[facility]   Reason for visit: Follow up on bacteremia  Interval History: repeat blood cultures sent today; last positive again on 4/21.      Physical Exam: Constitutional:  Vitals:   07/27/22 1107 07/27/22 1115  BP: 123/87   Pulse:    Resp: 18 17  Temp: 98.2 F (36.8 C) 98.1 F (36.7 C)  SpO2: 100%    patient appears in NAD Respiratory: Normal respiratory effort  Review of Systems: Constitutional: negative for fevers and chills  Lab Results  Component Value Date   WBC 9.5 07/27/2022   HGB 9.5 (L) 07/27/2022   HCT 27.7 (L) 07/27/2022   MCV 89.4 07/27/2022   PLT 321 07/27/2022    Lab Results  Component Value Date   CREATININE 1.13 07/27/2022   BUN 11 07/27/2022   NA 127 (L) 07/27/2022   K 3.6 07/27/2022   CL 90 (L) 07/27/2022   CO2 25 07/27/2022    Lab Results  Component Value Date   ALT 35 07/26/2022   AST 28 07/26/2022   ALKPHOS 137 (H) 07/26/2022     Microbiology: Recent Results (from the past 240 hour(s))  Culture, blood (Routine X 2) w Reflex to ID Panel     Status: Abnormal   Collection Time: 07/24/22  3:55 PM   Specimen: BLOOD LEFT ARM  Result Value Ref Range Status   Specimen Description   Final    BLOOD LEFT ARM Performed at [facility], [address], [place], [place] [address]    Special Requests   Final    AEROBIC BOTTLE ONLY Blood Culture adequate volume Performed at [facility], [address], [place], [place] [address]    Culture  Setup Time   Final    GRAM POSITIVE COCCI IN CLUSTERS AEROBIC BOTTLE ONLY CRITICAL RESULT CALLED TO, READ BACK BY AND VERIFIED WITH: PHARMD T. JOHNSON 07/26/22 @ 0016 BY AB Performed at [facility], [address] [place], [place] [address]    Culture METHICILLIN RESISTANT STAPHYLOCOCCUS AUREUS (A)  Final   Report Status 07/27/2022 FINAL  Final   Organism ID, Bacteria METHICILLIN RESISTANT STAPHYLOCOCCUS AUREUS  Final      Susceptibility    Methicillin resistant staphylococcus aureus - MIC*    CIPROFLOXACIN >=8 RESISTANT Resistant     ERYTHROMYCIN >=8 RESISTANT Resistant     GENTAMICIN <=0.5 SENSITIVE Sensitive     OXACILLIN >=4 RESISTANT Resistant     TETRACYCLINE <=1 SENSITIVE Sensitive     VANCOMYCIN <=0.5 SENSITIVE Sensitive     TRIMETH/SULFA >=320 RESISTANT Resistant     CLINDAMYCIN <=0.25 SENSITIVE Sensitive     RIFAMPIN <=0.5 SENSITIVE Sensitive     Inducible Clindamycin NEGATIVE Sensitive     * METHICILLIN RESISTANT STAPHYLOCOCCUS AUREUS  Culture, blood (Routine X 2) w Reflex to ID Panel     Status: Abnormal   Collection Time: 07/24/22  3:55 PM   Specimen: BLOOD  Result Value Ref Range Status   Specimen Description   Final    BLOOD BLOOD RIGHT ARM Performed at [facility], [address], [place], [place] [address]    Special Requests   Final    AEROBIC BOTTLE ONLY Blood Culture adequate volume Performed at [facility], [address] [place], [place] [address]    Culture  Setup Time   Final    GRAM POSITIVE COCCI IN CLUSTERS AEROBIC BOTTLE ONLY CRITICAL VALUE NOTED.  VALUE IS CONSISTENT WITH PREVIOUSLY REPORTED AND CALLED VALUE. CRITICAL RESULT CALLED TO, READ BACK BY AND VERIFIED WITH: PHARMD M.BELL AT 1315 ON 07/25/2022 BY T.SAAD.    Culture (A)  Final    STAPHYLOCOCCUS AUREUS SUSCEPTIBILITIES PERFORMED ON PREVIOUS CULTURE WITHIN THE LAST 5 DAYS. Performed at [facility], [address], [place], [place] [address]    Report Status 07/27/2022 FINAL  Final  Blood Culture ID Panel (Reflexed)     Status: Abnormal   Collection Time: 07/24/22  3:55 PM  Result Value Ref Range Status   Enterococcus faecalis NOT DETECTED NOT DETECTED Final   Enterococcus Faecium NOT DETECTED NOT DETECTED Final   Listeria monocytogenes NOT DETECTED NOT DETECTED Final   Staphylococcus species DETECTED (A) NOT DETECTED Final    Comment: CRITICAL RESULT CALLED TO, READ BACK BY  AND VERIFIED WITH: PHARMD T. JOHNSON 07/26/22 @ 0016 BY AB    Staphylococcus aureus (BCID) DETECTED (A) NOT DETECTED Final    Comment: Methicillin (oxacillin)-resistant Staphylococcus aureus (MRSA). MRSA is predictably resistant to beta-lactam antibiotics (except ceftaroline). Preferred therapy is vancomycin unless clinically contraindicated. Patient requires contact precautions if  hospitalized. CRITICAL RESULT CALLED TO, READ BACK BY AND VERIFIED WITH: PHARMD T. JOHNSON 07/26/22 @ 0016 BY AB    Staphylococcus epidermidis NOT DETECTED NOT DETECTED Final   Staphylococcus lugdunensis NOT DETECTED NOT DETECTED Final   Streptococcus species NOT DETECTED NOT DETECTED Final   Streptococcus agalactiae NOT DETECTED NOT DETECTED Final   Streptococcus pneumoniae NOT DETECTED NOT DETECTED Final   Streptococcus pyogenes NOT DETECTED NOT DETECTED Final   A.calcoaceticus-baumannii NOT DETECTED NOT DETECTED Final   Bacteroides fragilis NOT DETECTED NOT DETECTED Final   Enterobacterales NOT DETECTED NOT DETECTED Final   Enterobacter cloacae complex NOT DETECTED NOT DETECTED Final   Escherichia coli NOT DETECTED NOT DETECTED Final   Klebsiella aerogenes NOT DETECTED NOT DETECTED Final   Klebsiella oxytoca NOT DETECTED NOT DETECTED Final   Klebsiella pneumoniae NOT DETECTED NOT DETECTED Final   Proteus species NOT DETECTED NOT DETECTED Final   Salmonella species NOT DETECTED NOT DETECTED Final   Serratia marcescens NOT DETECTED NOT DETECTED Final   Haemophilus influenzae NOT DETECTED NOT DETECTED Final   Neisseria meningitidis NOT DETECTED NOT DETECTED Final   Pseudomonas aeruginosa NOT DETECTED NOT DETECTED Final   Stenotrophomonas maltophilia NOT DETECTED NOT DETECTED Final   Candida albicans NOT DETECTED NOT DETECTED Final   Candida auris NOT DETECTED NOT DETECTED Final   Candida glabrata NOT DETECTED NOT DETECTED Final   Candida krusei NOT DETECTED NOT DETECTED Final   Candida parapsilosis NOT  DETECTED NOT DETECTED Final   Candida tropicalis NOT DETECTED NOT DETECTED Final   Cryptococcus neoformans/gattii NOT DETECTED NOT DETECTED Final   Meth resistant mecA/C and MREJ DETECTED (A) NOT DETECTED Final    Comment: CRITICAL RESULT CALLED TO, READ BACK BY AND VERIFIED WITH: PHARMD T. JOHNSON 07/26/22 @ 0016 BY AB Performed at [facility], [address], [place], [place] [address]     Impression/Plan:  1. 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  He will need prolonged treatment.   Since his picc line has been in with persistently positive blood cultures, this will need to be removed.  This was discussed with him.  Empyema drainage when possible  I have personally spent 57 minutes involved in face-to-face and non-face-to-face activities for this patient on the day of the visit. Professional time spent includes the following  activities: Preparing to see the patient (review of tests), Obtaining and/or reviewing separately obtained history (admission/discharge record), Performing a medically appropriate examination and/or evaluation , Ordering medications/tests/procedures, referring and communicating with other health care professionals, Documenting clinical information in the EMR, Independently interpreting results (not separately reported), Communicating results to the patient, Counseling and educating the patient/family/caregiver and Care coordination (not separately reported).

## 2022-07-27 NOTE — Progress Notes (Signed)
PROGRESS NOTE    David [name]  IHK:[id number] DOB: 04-17-1960 DOA: 07/12/2022 PCP: [name], MD    Chief Complaint  Patient presents with   Shortness of Breath    Brief Narrative:  [name] is an 62 y.o. 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 [name], essential hypertension, came into the ED with nonproductive cough shortness of breath and fever 105 was found to be septic due to MRSA bacteremia, due to severe back pain MRI of the T-spine and L-spine was done that showed nonspecific finding no osteomyelitis or discitis 2D echo showed a TEE without vegetation ID recommended 2 weeks of antibiotics given his financial situation he was given erythromycin.  Nephrology was consulted and he was started on Lasix twice a day and tolvaptan as he was hyponatremic.  MRI of the right foot was done that was concerning for osteomyelitis orthopedic surgery and infectious disease were reengage, they have recommended IV daptomycin.  He hypokalemia back pain repeated MRI of the lumbar and thoracic spine showed discitis and osteomyelitis of T8-T9 with some fluid collection anteriorly to T7 T9 concerning for multiloculated prevertebral abscess, enhancing material posterior to the thecal sac on T8-T9 concerning for phlegmon causing significant mass effect of the thecal sac surgery recommended transfer the patient to [facility] for further evaluation of his acute osteomyelitis and neurosurgery evaluation. Neurosurgery evaluated the patient recommended to consult IR for possible drainage and IV antibiotics.  Pulmonary was also consulted for concern of empyema recommended IR to be consulted for chest tube placement   Assessment & Plan:   Principal Problem:   Acute respiratory failure with hypoxia Active Problems:    Essential hypertension   Insulin dependent type 2 diabetes mellitus   Atrial fibrillation with RVR   Sleep apnea suspected   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)   Pure hypercholesterolemia   Hyponatremia   Hypomagnesemia   Osteomyelitis of fifth toe of right foot   Sepsis due to pneumonia   Bacteremia   Infective myositis   Severe protein-calorie malnutrition   PVD (peripheral vascular disease)  #1 sepsis secondary to MRSA bacteremia in the setting of acute right foot infection with T8-T9 discitis and osteomyelitis with loculated peripherally enhancing fluid collection of T7-T9 concerning for abscess, T9 epidural phlegmon, large right pleural effusion concerning for empyema -Blood cultures obtained positive for 07/12/2022 on admission patient started on IV vancomycin Rocephin and azithromycin. -2D echo, TEE done negative for vegetations. -Surveillance blood cultures 07/13/2022 noted to be negative. -Repeat blood cultures 07/24/2022 positive for MRSA. -ESR of 87, CRP 15. -Neurosurgery consulted who felt no neurosurgical intervention recommended at this time and recommended IR consultation for catheter drainage. -Pulmonary consulted for evaluation of empyema and recommended IR consultation for chest tube placement with possible lytic therapy. -ID following, patient noted to be on vancomycin until ID would likely need prolonged treatment. -Patient noted to have PICC line in with persistent blood cultures and per ID this will need to be removed. -ID following and managing and appreciate their input and recommendations.  2.  Right foot ulcer with osteomyelitis of residual fifth metatarsal diaphysis -New osteomyelitis noted at prior amputation site. -MRI done consistent with acute osteomyelitis of residual fifth metatarsal diaphysis extending to the level of the proximal metaphysis -ID reconsulted to evaluate patient patient on  IV vancomycin. -Patient seen by  orthopedics who recommended transfer to [facility]. -ABI obtained with mild disease noted in the right lower extremity. -Vascular surgery consulted and patient for angiography of right lower extremity either tomorrow or Friday and recommended continuation of IV antibiotics -Continue to hold Eliquis. -Continue IV antibiotics per ID recommendations. -Per vascular and orthopedic surgery.  3.  Severe back pain secondary to acute T8-T9 osteomyelitis with loculated abscess -Patient seen in consultation by neurosurgery evaluated the patient and recommended no surgical intervention from their standpoint at this point in time I recommended IV antibiotics as recommended by ID with consultation of IR for drainage. -Continue current pain regimen.  4.  Hypervolemic hyponatremia -On admission sodium noted at 121, nephrology consulted, patient started on tolvaptan. -Sodium levels improved. -Urine output of 1.1 L in the past 24 hours, patient is -20.9 L during this hospitalization. -Sodium currently at baseline of 126-130. -Seen by nephrology, was on IV Lasix which has been stopped and recommending continuation of oral Lasix with fluid restriction. -Nephrology was following but have signed off.  5.  AKI on CKD stage IIIa -Baseline creatinine approximately 1.3-1.7. -Currently stable.  6.  Hypertension -Antihypertensive medications initially held secondary to soft/low blood pressure. -BP improved. -Continue current dose of oral Lasix, Coreg.  7.  Oral thrush -Nystatin swish and swallow.  8.  Insulin-dependent diabetes mellitus type 2 -Hemoglobin A1c 9.1 (07/13/2022) -CBG 246 this morning. -Continue current dose of Semglee, SSI.  9.  Hypomagnesemia -Magnesium noted at 1.6. -Magnesium sulfate 4 g IV x 1. -Repeat labs in the AM.  10.  Chronic A-fib -Continue Coreg for rate control. -Eliquis on hold for possible procedure in the next 1 to 2 days. -Vascular surgery to advise when anticoagulation  may be resumed.  11.  Severe protein calorie malnutrition -Nutritional supplementation.  12.  Acute on chronic combined systolic diastolic CHF -Start IV diuresis patient -20.9 L during his hospitalization has been transitioned to oral Lasix. -Continue Coreg, Lasix. Sherryll Burger on hold -Will need outpatient follow-up with cardiology.  13.  Acute confusional state -Continue melatonin, Seroquel as needed.  14.  Normocytic anemia -No signs of bleeding. -Nephrology ordered IV iron.  15.   DVT prophylaxis: Anticoagulation on hold in anticipation of possible procedure in the next 1 to 2 days. Code Status: Full Family Communication: Updated patient.  No family at bedside. Disposition: TBD  Status is: Inpatient Remains inpatient appropriate because: Severity of illness   Consultants:  Wound care RN: Janit Pagan, RN 07/12/2022 ID: Dr.Comer 07/13/2022 Nephrology: Dr. Arlean Hopping 07/21/2022 Neurosurgery: Dr. Yetta Barre 07/25/2022 Orthopedics: Dr. [name] 07/26/2022 Vascular surgery: Dr. Myra Gianotti 07/26/2022 Interventional radiology: Dr. Loreta Ave 07/26/2022    Procedures:  CT angiogram chest 07/12/2022 CT abdomen and pelvis 07/12/2022 Chest x-ray 07/12/2022, 07/16/2022, 07/20/2022, 07/24/2022 PICC line placement 07/16/2022 MRI T/L-spine 07/25/2022 MRI right foot 07/24/2022 2D echo 07/13/2022 TEE 07/15/2022 ABI with TBI 07/26/2022  Antimicrobials:  Anti-infectives (From admission, onward)    Start     Dose/Rate Route Frequency Ordered Stop   07/26/22 2000  vancomycin (VANCOCIN) IVPB 1000 mg/200 mL premix        1,000 mg 200 mL/hr over 60 Minutes Intravenous Every 12 hours 07/26/22 1054     07/24/22 1845  DAPTOmycin (CUBICIN) 650 mg in sodium chloride 0.9 % IVPB  Status:  Discontinued        8 mg/kg  83.9 kg 126 mL/hr over 30 Minutes Intravenous Daily 07/24/22 1759 07/26/22 1050   07/18/22 1500  Oritavancin Diphosphate (ORBACTIV)  1,200 mg in dextrose 5 % IVPB        1,200 mg 333.3 mL/hr over 180 Minutes  Intravenous Once 07/18/22 1351 07/18/22 1822   07/16/22 0000  daptomycin (CUBICIN) IVPB  Status:  Discontinued        650 mg Intravenous Every 24 hours 07/16/22 1116 07/19/22    07/15/22 2000  DAPTOmycin (CUBICIN) 650 mg in sodium chloride 0.9 % IVPB  Status:  Discontinued        8 mg/kg  81 kg 126 mL/hr over 30 Minutes Intravenous Daily 07/15/22 1411 07/15/22 1413   07/15/22 2000  DAPTOmycin (CUBICIN) 650 mg in sodium chloride 0.9 % IVPB  Status:  Discontinued        8 mg/kg  81 kg 126 mL/hr over 30 Minutes Intravenous Daily 07/15/22 1413 07/18/22 1351   07/14/22 0200  vancomycin (VANCOREADY) IVPB 1750 mg/350 mL  Status:  Discontinued        1,750 mg 175 mL/hr over 120 Minutes Intravenous Every 24 hours 07/13/22 0718 07/15/22 1411   07/13/22 1000  cefTRIAXone (ROCEPHIN) 1 g in sodium chloride 0.9 % 100 mL IVPB  Status:  Discontinued        1 g 200 mL/hr over 30 Minutes Intravenous Every 24 hours 07/12/22 1405 07/13/22 0239   07/13/22 1000  azithromycin (ZITHROMAX) 500 mg in sodium chloride 0.9 % 250 mL IVPB  Status:  Discontinued        500 mg 250 mL/hr over 60 Minutes Intravenous Every 24 hours 07/12/22 1405 07/13/22 0239   07/13/22 0330  vancomycin (VANCOREADY) IVPB 1750 mg/350 mL        1,750 mg 175 mL/hr over 120 Minutes Intravenous  Once 07/13/22 0240 07/13/22 0517   07/12/22 0845  cefTRIAXone (ROCEPHIN) 1 g in sodium chloride 0.9 % 100 mL IVPB        1 g 200 mL/hr over 30 Minutes Intravenous  Once 07/12/22 0844 07/12/22 1218   07/12/22 0845  azithromycin (ZITHROMAX) 500 mg in sodium chloride 0.9 % 250 mL IVPB        500 mg 250 mL/hr over 60 Minutes Intravenous  Once 07/12/22 0844 07/12/22 1218         Subjective: Patient laying in bed.  Complains of severe pain in his back and his right lung.  No chest pain.  No abdominal pain.  States she has Pain medication.  Not complaining of significant pain in the right foot.  Objective: Vitals:   07/27/22 0728 07/27/22 0954  07/27/22 1107 07/27/22 1115  BP: 139/83  123/87   Pulse: (!) 107 85    Resp: Temp: 98 F (36.7 C)  98.2 F (36.8 C) 98.1 F (36.7 C)  TempSrc: Oral  Oral Oral  SpO2: 95%  100%   Weight:      Height:        Intake/Output Summary (Last 24 hours) at 07/27/2022 1410 Last data filed at 07/26/2022 2122 Gross per 24 hour  Intake 470 ml  Output 300 ml  Net 170 ml   Filed Weights   07/23/22 0700 07/24/22 0500 07/25/22 0500  Weight: 84.4 kg 83.9 kg 84.5 kg    Examination:  General exam: Appears calm and comfortable  Respiratory system: Clear to auscultation anterior lung fields.  Decreased breath sounds in the bases.  No wheezes, no crackles, no rhonchi.  Fair air movement.  Speaking in full sentences.Marland Kitchen Respiratory effort normal. Cardiovascular system: S1 & S2 heard, RRR. No  JVD, murmurs, rubs, gallops or clicks.  1+ right lower extremity edema.   Gastrointestinal system: Abdomen is nondistended, soft and nontender. No organomegaly or masses felt. Normal bowel sounds heard. Central nervous system: Alert and oriented. No focal neurological deficits. Extremities: 1+ right lower extremity edema/right foot bandaged.  Skin: No rashes, lesions or ulcers Psychiatry: Judgement and insight appear normal. Mood & affect appropriate.     Data Reviewed: I have personally reviewed following labs and imaging studies  CBC: Recent Labs  Lab 07/23/22 0240 07/24/22 0500 07/25/22 0500 07/26/22 0240 07/27/22 1128  WBC 9.0 12.5* 9.6 9.7 9.5  NEUTROABS 6.4 9.6* 7.4 7.1 6.7  HGB 10.1* 10.1* 9.5* 10.1* 9.5*  HCT 30.1* 30.5* 28.5* 29.0* 27.7*  MCV 92.0 92.1 91.3 88.7 89.4  PLT 366 376 318 328 321    Basic Metabolic Panel: Recent Labs  Lab 07/22/22 0306 07/23/22 0240 07/24/22 0500 07/24/22 1555 07/24/22 1700 07/25/22 0500 07/26/22 0240 07/27/22 0350 07/27/22 1128  NA 125* 130* 124* 128* 126* 126* 126*  --  127*  K 4.0 3.9 4.2  --   --  3.8 4.0  --  3.6  CL 98 99 94*  --    --  95* 90*  --  90*  CO2 20* 24 23  --   --  23 24  --  25  GLUCOSE 174* 206* 179*  --   --  209* 258*  --  139*  BUN 24* 21* 15  --   --  14 11  --  11  CREATININE 1.23 1.21 1.24  --   --  1.06 1.18  --  1.13  CALCIUM 8.3* 8.5* 8.5*  --   --  8.4* 8.8*  --  8.9  MG 1.9 2.1 1.7  --   --  1.7 1.5* 1.6*  --   PHOS 3.0 3.6 3.0  --   --  3.6 3.1  --   --     GFR: Estimated Creatinine Clearance: 76.3 mL/min (by C-G formula based on SCr of 1.13 mg/dL).  Liver Function Tests: Recent Labs  Lab 07/22/22 0306 07/23/22 0240 07/24/22 0500 07/25/22 0500 07/26/22 0240  AST 25 26 25 25 28   ALT 29 31 32 31 35  ALKPHOS 130* 139* 140* 125 137*  BILITOT 1.0 0.7 0.7 0.8 0.4  PROT 6.5 6.9 6.9 6.8 6.7  ALBUMIN 2.1* 2.2* 1.9* 2.1* 1.8*    CBG: Recent Labs  Lab 07/26/22 1254 07/26/22 1646 07/26/22 2108 07/27/22 0622 07/27/22 1117  GLUCAP 121* 214* 224* 246* 115*     Recent Results (from the past 240 hour(s))  Culture, blood (Routine X 2) w Reflex to ID Panel     Status: Abnormal   Collection Time: 07/24/22  3:55 PM   Specimen: BLOOD LEFT ARM  Result Value Ref Range Status   Specimen Description   Final    BLOOD LEFT ARM Performed at [facility], [address], [place], [place] [address]    Special Requests   Final    AEROBIC BOTTLE ONLY Blood Culture adequate volume Performed at [facility], [address], [place], [place] [address]    Culture  Setup Time   Final    GRAM POSITIVE COCCI IN CLUSTERS AEROBIC BOTTLE ONLY CRITICAL RESULT CALLED TO, READ BACK BY AND VERIFIED WITH: PHARMD T. JOHNSON 07/26/22 @ 0016 BY AB Performed at [facility], [address], [place], [place] [address]    Culture  METHICILLIN RESISTANT STAPHYLOCOCCUS AUREUS (A)  Final   Report Status 07/27/2022 FINAL  Final   Organism ID, Bacteria METHICILLIN RESISTANT STAPHYLOCOCCUS AUREUS  Final      Susceptibility   Methicillin resistant staphylococcus aureus - MIC*     CIPROFLOXACIN >=8 RESISTANT Resistant     ERYTHROMYCIN >=8 RESISTANT Resistant     GENTAMICIN <=0.5 SENSITIVE Sensitive     OXACILLIN >=4 RESISTANT Resistant     TETRACYCLINE <=1 SENSITIVE Sensitive     VANCOMYCIN <=0.5 SENSITIVE Sensitive     TRIMETH/SULFA >=320 RESISTANT Resistant     CLINDAMYCIN <=0.25 SENSITIVE Sensitive     RIFAMPIN <=0.5 SENSITIVE Sensitive     Inducible Clindamycin NEGATIVE Sensitive     * METHICILLIN RESISTANT STAPHYLOCOCCUS AUREUS  Culture, blood (Routine X 2) w Reflex to ID Panel     Status: Abnormal   Collection Time: 07/24/22  3:55 PM   Specimen: BLOOD  Result Value Ref Range Status   Specimen Description   Final    BLOOD BLOOD RIGHT ARM Performed at [facility], [address], [place], [place] [address]    Special Requests   Final    AEROBIC BOTTLE ONLY Blood Culture adequate volume Performed at [facility], [address] [place], [place] [address]    Culture  Setup Time   Final    GRAM POSITIVE COCCI IN CLUSTERS AEROBIC BOTTLE ONLY CRITICAL VALUE NOTED.  VALUE IS CONSISTENT WITH PREVIOUSLY REPORTED AND CALLED VALUE. CRITICAL RESULT CALLED TO, READ BACK BY AND VERIFIED WITH: PHARMD M.BELL AT 1315 ON 07/25/2022 BY T.SAAD.    Culture (A)  Final    STAPHYLOCOCCUS AUREUS SUSCEPTIBILITIES PERFORMED ON PREVIOUS CULTURE WITHIN THE LAST 5 DAYS. Performed at [facility], [address] [place], [place] [address]    Report Status 07/27/2022 FINAL  Final  Blood Culture ID Panel (Reflexed)     Status: Abnormal   Collection Time: 07/24/22  3:55 PM  Result Value Ref Range Status   Enterococcus faecalis NOT DETECTED NOT DETECTED Final   Enterococcus Faecium NOT DETECTED NOT DETECTED Final   Listeria monocytogenes NOT DETECTED NOT DETECTED Final   Staphylococcus species DETECTED (A) NOT DETECTED Final    Comment: CRITICAL RESULT CALLED TO, READ BACK BY AND VERIFIED WITH: PHARMD T. JOHNSON 07/26/22 @ 0016 BY  AB    Staphylococcus aureus (BCID) DETECTED (A) NOT DETECTED Final    Comment: Methicillin (oxacillin)-resistant Staphylococcus aureus (MRSA). MRSA is predictably resistant to beta-lactam antibiotics (except ceftaroline). Preferred therapy is vancomycin unless clinically contraindicated. Patient requires contact precautions if  hospitalized. CRITICAL RESULT CALLED TO, READ BACK BY AND VERIFIED WITH: PHARMD T. JOHNSON 07/26/22 @ 0016 BY AB    Staphylococcus epidermidis NOT DETECTED NOT DETECTED Final   Staphylococcus lugdunensis NOT DETECTED NOT DETECTED Final   Streptococcus species NOT DETECTED NOT DETECTED Final   Streptococcus agalactiae NOT DETECTED NOT DETECTED Final   Streptococcus pneumoniae NOT DETECTED NOT DETECTED Final   Streptococcus pyogenes NOT DETECTED NOT DETECTED Final   A.calcoaceticus-baumannii NOT DETECTED NOT DETECTED Final   Bacteroides fragilis NOT DETECTED NOT DETECTED Final   Enterobacterales NOT DETECTED NOT DETECTED Final   Enterobacter cloacae complex NOT DETECTED NOT DETECTED Final   Escherichia coli NOT DETECTED NOT DETECTED Final   Klebsiella aerogenes NOT DETECTED NOT DETECTED Final   Klebsiella oxytoca NOT DETECTED NOT DETECTED Final   Klebsiella pneumoniae NOT DETECTED NOT DETECTED Final   Proteus species NOT DETECTED NOT DETECTED Final  Salmonella species NOT DETECTED NOT DETECTED Final   Serratia marcescens NOT DETECTED NOT DETECTED Final   Haemophilus influenzae NOT DETECTED NOT DETECTED Final   Neisseria meningitidis NOT DETECTED NOT DETECTED Final   Pseudomonas aeruginosa NOT DETECTED NOT DETECTED Final   Stenotrophomonas maltophilia NOT DETECTED NOT DETECTED Final   Candida albicans NOT DETECTED NOT DETECTED Final   Candida auris NOT DETECTED NOT DETECTED Final   Candida glabrata NOT DETECTED NOT DETECTED Final   Candida krusei NOT DETECTED NOT DETECTED Final   Candida parapsilosis NOT DETECTED NOT DETECTED Final   Candida tropicalis NOT  DETECTED NOT DETECTED Final   Cryptococcus neoformans/gattii NOT DETECTED NOT DETECTED Final   Meth resistant mecA/C and MREJ DETECTED (A) NOT DETECTED Final    Comment: CRITICAL RESULT CALLED TO, READ BACK BY AND VERIFIED WITH: PHARMD T. JOHNSON 07/26/22 @ 0016 BY AB Performed at [facility], [address] [place], [place] [address]          Radiology Studies: VAS [place] ABI WITH/WO TBI  Result Date: 07/26/2022  LOWER EXTREMITY DOPPLER STUDY Patient Name:  LASTER APPLING  Date of Exam:   07/26/2022 Medical Rec #: [id number]    Accession #:    [id number] Date of Birth: 1960-12-02     Patient Gender: M Patient Age:   63 years Exam Location:  [facility] Procedure:      VAS [place] ABI WITH/WO TBI Referring Phys: MARCUS DUDA --------------------------------------------------------------------------------  Indications: Gangrene, and peripheral artery disease. High Risk Factors: Hypertension, Diabetes.  Vascular Interventions: 03/20/2022 - Right RAY RESECTION FOOT. Limitations: Today's exam was limited due to bandages , involuntary patient              movement, an open wound and Right restricted arm. Comparison Study: 01/05/2022 - Right: Resting right ankle-brachial index                   indicates mild right lower                   extremity arterial disease. The right toe-brachial index is                   abnormal.                    Left: Resting left ankle-brachial index indicates moderate                   left lower                   extremity arterial disease. The left toe-brachial index is                   abnormal. Performing Technologist: Olen Cordial RVT  Examination Guidelines: A complete evaluation includes at minimum, Doppler waveform signals and systolic blood pressure reading at the level of bilateral brachial, anterior tibial, and posterior tibial arteries, when vessel segments are accessible. Bilateral testing is considered an integral part of a complete examination. Photoelectric  Plethysmograph (PPG) waveforms and toe systolic pressure readings are included as required and additional duplex testing as needed. Limited examinations for reoccurring indications may be performed as noted.  ABI Findings: +---------+------------------+-----+----------+--------------+ Right    Rt Pressure (mmHg)IndexWaveform  Comment        +---------+------------------+-----+----------+--------------+ Brachial  Restricted arm +---------+------------------+-----+----------+--------------+ PTA      113               0.90 monophasic               +---------+------------------+-----+----------+--------------+ DP       113               0.90 monophasic               +---------+------------------+-----+----------+--------------+ Great Toe                       Absent                   +---------+------------------+-----+----------+--------------+ +---------+------------------+-----+-----------+-------+ Left     Lt Pressure (mmHg)IndexWaveform   Comment +---------+------------------+-----+-----------+-------+ Brachial 125                    triphasic          +---------+------------------+-----+-----------+-------+ PTA      122               0.98 multiphasic        +---------+------------------+-----+-----------+-------+ DP       114               0.91 multiphasic        +---------+------------------+-----+-----------+-------+ Great Toe                       Absent             +---------+------------------+-----+-----------+-------+  Summary: Right: Resting right ankle-brachial index indicates mild right lower extremity arterial disease. Unable to obtain TBI due to inconsistent/absent waveforms. Left: Resting left ankle-brachial index is within normal range. Unable to obtain TBI due to inconsistent/absent waveforms. *See table(s) above for measurements and observations.  Electronically signed by Coral Else MD on 07/26/2022 at  7:27:39 PM.    Final    MR THORACIC SPINE W WO CONTRAST  Result Date: 07/25/2022 CLINICAL DATA:  Follow-up MR for MRSA bacteremia and concern for seeding EXAM: MRI THORACIC AND LUMBAR SPINE WITHOUT AND WITH CONTRAST TECHNIQUE: Multiplanar and multiecho pulse sequences of the thoracic and lumbar spine were obtained without and with intravenous contrast. CONTRAST:  8mL GADAVIST GADOBUTROL 1 MMOL/ML IV SOLN COMPARISON:  07/13/2022 MRI thoracic and lumbar spine FINDINGS: MRI THORACIC SPINE FINDINGS Alignment: Mild scoliosis and exaggeration of the normal thoracic kyphosis. No significant listhesis. Vertebrae: Decreased T1 signal, increased T2 signal, and contrast enhancement throughout the T8 and T11 vertebral bodies, with increased fluid signal and enhancement in the T8-T9 disc space (series 21, image 12 and series 25, image 12), which is new from the prior exam. No acute fracture or suspicious osseous lesion. Cord: T1 hypointense, contrast enhancing material along the anterior aspect of the thecal sac posterior to T8 and T9 (series 23 and 26, images 24-27), most likely phlegmon. There is likely phlegmon along the right aspect of the thecal sac posterior to T9 (series 25, image 11), although this area is not covered well on the axial sequences. This does not cause significant mass effect on the thecal sac. Prominent epidural fat also does not cause significant mass effect on the thecal sac. No abnormal spinal cord enhancement. Paraspinal and other soft tissues: Anterior to the T7-T9 vertebral bodies, there is a loculated, peripherally enhancing fluid collection, which measures up to 3.1 x 5.9 x 5.9 cm (AP x TR x CC) (series 26, image 23 and  24; series 25, image 5), concerning for multiloculated prevertebral abscess. In addition, there is a large right pleural effusion with a thick enhancing rim, concerning for empyema (series 26, image 23). Disc levels: Mild degenerative changes, without significant spinal canal  stenosis or neural foraminal narrowing. MRI LUMBAR SPINE FINDINGS Segmentation:  5 lumbar type vertebral bodies. Alignment:  Mild levoscoliosis.  No significant listhesis. Vertebrae: No acute fracture, evidence of discitis, suspicious osseous lesion, or abnormal osseous enhancement. Conus medullaris: Extends to the L1 level and appears normal. No evidence of epidural collection. No abnormal enhancement. Paraspinal and other soft tissues: No acute finding. Redemonstrated increased T2 signal in the paraspinous musculature (series 2, images 3 and 9), without abnormal in his is no focal fluid collection in the paraspinous muscles. Disc levels: Degenerative changes are stable from the 07/13/2022 exam. Please see that report for detailed findings. IMPRESSION: 1. Findings concerning for discitis osteomyelitis at T8-T9, with a loculated, peripherally enhancing fluid collection anterior to the T7-T9 vertebral bodies, concerning for a multiloculated prevertebral abscess. 2. Enhancing material along the anterior aspect of the thecal sac posterior to T8 and T9, and likely along the right aspect of the thecal sac at T9, concerning for epidural phlegmon, which does not cause significant mass effect on the thecal sac. 3. Large right pleural effusion with a thick enhancing rim, concerning for empyema. 4. No evidence of discitis or osteomyelitis in the lumbar spine. These results will be called to the ordering clinician or representative by the Radiologist Assistant, and communication documented in the PACS or [employer]. Electronically Signed   By: Wiliam Ke M.D.   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  CONTRAST:  8mL GADAVIST GADOBUTROL 1 MMOL/ML IV SOLN COMPARISON:  07/13/2022 MRI thoracic and lumbar spine FINDINGS: MRI THORACIC SPINE FINDINGS Alignment: Mild scoliosis and exaggeration of the normal thoracic kyphosis. No significant listhesis. Vertebrae: Decreased T1 signal, increased T2 signal, and contrast enhancement throughout the T8 and T11 vertebral bodies, with increased fluid signal and enhancement in the T8-T9 disc space (series 21, image 12 and series 25, image 12), which is new from the prior exam. No acute fracture or suspicious osseous lesion. Cord: T1 hypointense, contrast enhancing material along the anterior aspect of the thecal sac posterior to T8 and T9 (series 23 and 26, images 24-27), most likely phlegmon. There is likely phlegmon along the right aspect of the thecal sac posterior to T9 (series 25, image 11), although this area is not covered well on the axial sequences. This does not cause significant mass effect on the thecal sac. Prominent epidural fat also does not cause significant mass effect on the thecal sac. No abnormal spinal cord enhancement. Paraspinal and other soft tissues: Anterior to the T7-T9 vertebral bodies, there is a loculated, peripherally enhancing fluid collection, which measures up to 3.1 x 5.9 x 5.9 cm (AP x TR x CC) (series 26, image 23 and 24; series 25, image 5), concerning for multiloculated prevertebral abscess. In addition, there is a large right pleural effusion with a thick enhancing rim, concerning for empyema (series 26, image 23). Disc levels: Mild degenerative changes, without significant spinal canal stenosis or neural foraminal narrowing. MRI LUMBAR SPINE FINDINGS Segmentation:  5 lumbar type vertebral bodies. Alignment:  Mild levoscoliosis.  No significant listhesis. Vertebrae: No acute fracture, evidence of discitis, suspicious osseous lesion, or abnormal osseous enhancement. Conus medullaris: Extends to the L1 level and appears normal. No evidence of  epidural collection. No abnormal enhancement. Paraspinal and other soft tissues: No acute finding. Redemonstrated increased T2 signal in the paraspinous musculature (series 2, images 3 and 9), without abnormal in his is no focal fluid collection in the paraspinous muscles. Disc levels: Degenerative changes are stable from the 07/13/2022 exam. Please see that report for detailed findings. IMPRESSION: 1. Findings concerning for discitis osteomyelitis at T8-T9, with a loculated, peripherally enhancing fluid collection anterior to the T7-T9 vertebral bodies, concerning for a multiloculated prevertebral abscess. 2. Enhancing material along the anterior aspect of the thecal sac posterior to T8 and T9, and likely along the right aspect of the thecal sac at T9, concerning for epidural phlegmon, which does not cause significant mass effect on the thecal sac. 3. Large right pleural effusion with a thick enhancing rim, concerning for empyema. 4. No evidence of discitis or osteomyelitis in the lumbar spine. These results will be called to the ordering clinician or representative by the Radiologist Assistant, and communication documented in the PACS or [employer]. Electronically Signed   By: Wiliam Ke M.D.   On: 07/25/2022 16:55        Scheduled Meds:  budesonide (PULMICORT) nebulizer solution  0.5 mg Nebulization BID   busPIRone  10 mg Oral BID   carvedilol  3.125 mg Oral BID   Chlorhexidine Gluconate Cloth  6 each Topical Daily   clopidogrel  75 mg Oral Q breakfast   digoxin  125 mcg Oral Daily   famotidine  20 mg Oral BID   furosemide  40 mg Oral Daily   insulin aspart  0-20 Units Subcutaneous TID WC   insulin glargine-yfgn  20 Units Subcutaneous Daily   lidocaine  1 patch Transdermal Daily   nystatin  5 mL Oral QID   polyethylene glycol  17 g Oral BID   senna-docusate  2 tablet Oral BID   sodium chloride flush  10-40 mL Intracatheter Q12H   Continuous Infusions:  ferric gluconate (FERRLECIT)  IVPB 250 mg (07/27/22 1104)   magnesium sulfate bolus IVPB     vancomycin 1,000 mg (07/27/22 0832)     LOS: 15 days    Time spent: 40 minutes    Ramiro Harvest, MD [facility]   To contact the attending provider between 7A-7P or the covering provider during after hours 7P-7A, please log into the web site [email or web address] and access using universal [place] password for that web site. If you do not have the password, please call the hospital operator.  07/27/2022, 2:10 PM

## 2022-07-27 NOTE — Procedures (Signed)
Pre procedural Dx: Right sided pleural effusion Post procedural Dx: Same  Technically successful CT guided placed of a 12 Fr drainage catheter placement into the loculated right sided pleural effusion yielding 200 cc of blood tinged fluid.   A representative aspirated sample was capped and sent to the laboratory for analysis.    Chest tube connected to Pleur-Vac device.   EBL: Trace Complications: None immediate  [name] Right, MD Pager #: [phone]

## 2022-07-27 NOTE — Progress Notes (Signed)
Patient arrived backfrom IR to room 4e14, patient with chest tube. Placed to suction as ordered. Vital signs obtained. Family at bedside. Modene Andy, Randall An RN

## 2022-07-27 NOTE — Progress Notes (Signed)
Mobility Specialist: Progress Note   07/27/22 1441  Mobility  Activity Refused mobility   Pt refused mobility secondary to pain level and recently receiving pain medication. Will f/u as able.   David [name] Mobility Specialist Please contact via SecureChat or Rehab office at [phone]

## 2022-07-28 ENCOUNTER — Encounter (HOSPITAL_COMMUNITY)
Admission: EM | Disposition: A | Discharge status: Home Health | Payer: Self-pay | Source: Home / Self Care | Attending: Internal Medicine

## 2022-07-28 ENCOUNTER — Ambulatory Visit: Payer: Self-pay | Admitting: Internal Medicine

## 2022-07-28 DIAGNOSIS — M86071 Acute hematogenous osteomyelitis, right ankle and foot: Secondary | ICD-10-CM

## 2022-07-28 HISTORY — PX: ABDOMINAL AORTOGRAM W/LOWER EXTREMITY: CATH118223

## 2022-07-28 LAB — RENAL FUNCTION PANEL
Albumin: 1.6 g/dL — ABNORMAL LOW (ref 3.5–5.0)
Anion gap: 10 (ref 5–15)
BUN: 10 mg/dL (ref 6–20)
CO2: 25 mmol/L (ref 22–32)
Calcium: 8.3 mg/dL — ABNORMAL LOW (ref 8.9–10.3)
Chloride: 92 mmol/L — ABNORMAL LOW (ref 98–111)
Creatinine, Ser: 1.16 mg/dL (ref 0.61–1.24)
GFR, Estimated: >60 mL/min (ref >60)
Glucose, Bld: 210 mg/dL — ABNORMAL HIGH (ref 70–99)
Phosphorus: 3 mg/dL (ref 2.5–4.6)
Potassium: 3.5 mmol/L (ref 3.5–5.1)
Sodium: 127 mmol/L — ABNORMAL LOW (ref 135–145)

## 2022-07-28 LAB — CBC WITH DIFFERENTIAL/PLATELET
Abs Immature Granulocytes: 0.06 10*3/uL (ref 0.00–0.07)
Basophils Absolute: 0.1 10*3/uL (ref 0.0–0.1)
Basophils Relative: 1 %
Eosinophils Absolute: 0.3 10*3/uL (ref 0.0–0.5)
Eosinophils Relative: 3 %
HCT: 25.4 % — ABNORMAL LOW (ref 39.0–52.0)
Hemoglobin: 8.9 g/dL — ABNORMAL LOW (ref 13.0–17.0)
Immature Granulocytes: 1 %
Lymphocytes Relative: 12 %
Lymphs Abs: 1 10*3/uL (ref 0.7–4.0)
MCH: 30.5 pg (ref 26.0–34.0)
MCHC: 35 g/dL (ref 30.0–36.0)
MCV: 87 fL (ref 80.0–100.0)
Monocytes Absolute: 1.1 10*3/uL — ABNORMAL HIGH (ref 0.1–1.0)
Monocytes Relative: 13 %
Neutro Abs: 5.9 10*3/uL (ref 1.7–7.7)
Neutrophils Relative %: 70 %
Platelets: 257 10*3/uL (ref 150–400)
RBC: 2.92 MIL/uL — ABNORMAL LOW (ref 4.22–5.81)
RDW: 13.6 % (ref 11.5–15.5)
WBC: 8.5 10*3/uL (ref 4.0–10.5)
nRBC: 0 % (ref 0.0–0.2)

## 2022-07-28 LAB — MAGNESIUM: Magnesium: 2.1 mg/dL (ref 1.7–2.4)

## 2022-07-28 LAB — CULTURE, BLOOD (ROUTINE X 2): Special Requests: ADEQUATE

## 2022-07-28 LAB — GLUCOSE, CAPILLARY
Glucose-Capillary: 191 mg/dL — ABNORMAL HIGH (ref 70–99)
Glucose-Capillary: 164 mg/dL — ABNORMAL HIGH (ref 70–99)
Glucose-Capillary: 158 mg/dL — ABNORMAL HIGH (ref 70–99)
Glucose-Capillary: 213 mg/dL — ABNORMAL HIGH (ref 70–99)

## 2022-07-28 LAB — AEROBIC/ANAEROBIC CULTURE W GRAM STAIN (SURGICAL/DEEP WOUND)

## 2022-07-28 SURGERY — ABDOMINAL AORTOGRAM W/LOWER EXTREMITY
Anesthesia: LOCAL

## 2022-07-28 MED ORDER — HEPARIN SODIUM (PORCINE) 1000 UNIT/ML IJ SOLN
INTRAMUSCULAR | Status: AC
  Filled 2022-07-28: qty 10

## 2022-07-28 MED ORDER — ONDANSETRON HCL 4 MG/2ML IJ SOLN: 4.0000 mg | Freq: Four times a day (QID) | INTRAMUSCULAR | Status: DC | PRN

## 2022-07-28 MED ORDER — LIDOCAINE HCL (PF) 1 % IJ SOLN
INTRAMUSCULAR | Status: AC
  Filled 2022-07-28: qty 30

## 2022-07-28 MED ORDER — SODIUM CHLORIDE 0.9 % IV SOLN: 250.0000 mL | INTRAVENOUS | Status: DC | PRN

## 2022-07-28 MED ORDER — SODIUM CHLORIDE 0.9 % IV SOLN: INTRAVENOUS | Status: DC

## 2022-07-28 MED ORDER — MIDAZOLAM HCL 2 MG/2ML IJ SOLN
INTRAMUSCULAR | Status: AC
  Filled 2022-07-28: qty 2

## 2022-07-28 MED ORDER — ASPIRIN 81 MG PO TBEC
81.0000 mg | DELAYED_RELEASE_TABLET | Freq: Every day | ORAL | Status: DC
  Administered 2022-07-29 – 2022-08-19 (×21): 81 mg via ORAL
  Filled 2022-07-28 (×21): qty 1

## 2022-07-28 MED ORDER — MIDAZOLAM HCL 2 MG/2ML IJ SOLN
INTRAMUSCULAR | Status: DC | PRN
  Administered 2022-07-28 (×2): 1 mg via INTRAVENOUS

## 2022-07-28 MED ORDER — HYDRALAZINE HCL 20 MG/ML IJ SOLN: 5.0000 mg | INTRAMUSCULAR | Status: DC | PRN

## 2022-07-28 MED ORDER — FENTANYL CITRATE (PF) 100 MCG/2ML IJ SOLN
INTRAMUSCULAR | Status: AC
  Filled 2022-07-28: qty 2

## 2022-07-28 MED ORDER — LIDOCAINE HCL (PF) 1 % IJ SOLN
INTRAMUSCULAR | Status: DC | PRN
  Administered 2022-07-28: 10 mL

## 2022-07-28 MED ORDER — LABETALOL HCL 5 MG/ML IV SOLN
10.0000 mg | INTRAVENOUS | Status: DC | PRN
  Filled 2022-07-28: qty 4

## 2022-07-28 MED ORDER — ACETAMINOPHEN 325 MG PO TABS
650.0000 mg | ORAL_TABLET | ORAL | Status: DC | PRN
  Administered 2022-07-28 – 2022-08-12 (×12): 650 mg via ORAL
  Filled 2022-07-28 (×11): qty 2

## 2022-07-28 MED ORDER — HEPARIN (PORCINE) IN NACL 1000-0.9 UT/500ML-% IV SOLN
INTRAVENOUS | Status: DC | PRN
  Administered 2022-07-28 (×2): 500 mL

## 2022-07-28 MED ORDER — IOHEXOL 350 MG/ML SOLN
INTRAVENOUS | Status: DC | PRN
  Administered 2022-07-28: 120 mL

## 2022-07-28 MED ORDER — HEPARIN SODIUM (PORCINE) 1000 UNIT/ML IJ SOLN
INTRAMUSCULAR | Status: DC | PRN
  Administered 2022-07-28: 8000 [IU] via INTRAVENOUS

## 2022-07-28 MED ORDER — FENTANYL CITRATE (PF) 100 MCG/2ML IJ SOLN
INTRAMUSCULAR | Status: DC | PRN
  Administered 2022-07-28 (×2): 25 ug via INTRAVENOUS

## 2022-07-28 MED ORDER — SODIUM CHLORIDE 0.9% FLUSH: 3.0000 mL | INTRAVENOUS | Status: DC | PRN

## 2022-07-28 MED ORDER — ATORVASTATIN CALCIUM 80 MG PO TABS
80.0000 mg | ORAL_TABLET | Freq: Every day | ORAL | Status: DC
  Administered 2022-07-29 – 2022-08-19 (×22): 80 mg via ORAL
  Filled 2022-07-28 (×3): qty 1
  Filled 2022-07-28: qty 2
  Filled 2022-07-28 (×19): qty 1

## 2022-07-28 MED ORDER — SODIUM CHLORIDE 0.9% FLUSH
3.0000 mL | Freq: Two times a day (BID) | INTRAVENOUS | Status: DC
  Administered 2022-07-28: 3 mL via INTRAVENOUS

## 2022-07-28 SURGICAL SUPPLY — 17 items
BAG SNAP BAND KOVER 36X36 (MISCELLANEOUS) IMPLANT
BALLN STERLING OTW 3X100X150 (BALLOONS) ×1
BALLOON STERLING OTW 3X100X150 (BALLOONS) IMPLANT
CATH OMNI FLUSH 5F 65CM (CATHETERS) IMPLANT
COVER DOME SNAP 22 D (MISCELLANEOUS) IMPLANT
DEVICE CLOSURE MYNXGRIP 5F (Vascular Products) IMPLANT
KIT MICROPUNCTURE NIT STIFF (SHEATH) IMPLANT
KIT PV (KITS) ×2 IMPLANT
SHEATH CATAPULT 5FR 60 (SHEATH) IMPLANT
SHEATH PINNACLE 5F 10CM (SHEATH) IMPLANT
STOPCOCK MORSE 400PSI 3WAY (MISCELLANEOUS) IMPLANT
SYR MEDRAD MARK 7 150ML (SYRINGE) ×2 IMPLANT
TRANSDUCER W/STOPCOCK (MISCELLANEOUS) ×2 IMPLANT
TRAY PV CATH (CUSTOM PROCEDURE TRAY) ×2 IMPLANT
TUBING CIL FLEX 10 FLL-RA (TUBING) IMPLANT
WIRE BENTSON .035X145CM (WIRE) IMPLANT
WIRE G V18X300CM (WIRE) IMPLANT

## 2022-07-28 NOTE — Progress Notes (Signed)
Mobility Specialist: Progress Note   07/28/22 1631  Mobility  Activity Contraindicated/medical hold   Hold on mobility d/t pt on bedrest. Will f/u as able.   David [name] Mobility Specialist Please contact via SecureChat or Rehab office at [phone]

## 2022-07-28 NOTE — Progress Notes (Addendum)
  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 Chestine Spore  Reviewed procedure and he did not have any questions Keep NPO Consent order placed   Dory Horn [facility] [phone] 07/28/2022 7:42 AM  I have seen and evaluated the patient. I agree with the PA note as documented above. 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  Cephus Shelling, MD [facility] Office: [phone]

## 2022-07-28 NOTE — Plan of Care (Signed)
Pain management was the focus of this shift along with anxiety management. Pt had to be reassured and active listening provided multiple times throughout the shift to help with anxiety.   Total OP from chest tube was  Problem: Education: Goal: Knowledge of disease or condition will improve 07/28/2022 0600 by Mila Palmer, RN Outcome: Progressing 07/28/2022 0600 by Mila Palmer, RN Outcome: Progressing Goal: Knowledge of the prescribed therapeutic regimen will improve 07/28/2022 0600 by Mila Palmer, RN Outcome: Progressing 07/28/2022 0600 by Mila Palmer, RN Outcome: Progressing Goal: Individualized Educational Video(s) 07/28/2022 0600 by Mila Palmer, RN Outcome: Progressing 07/28/2022 0600 by Mila Palmer, RN Outcome: Progressing   Problem: Activity: Goal: Ability to tolerate increased activity will improve Outcome: Progressing Goal: Will verbalize the importance of balancing activity with adequate rest periods Outcome: Progressing

## 2022-07-28 NOTE — Progress Notes (Signed)
Pharmacy Antibiotic Note  David [name] is a 62 y.o. 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  Pharmacy has been consulted for Vancomycin dosing.  A Vancomycin peak/trough resulted as 25/14 mcg/ml respectively for an estimated AUC of 498 which is therapeutic.   Plan: - Continue Vancomycin 1g IV every 12 hours - Will continue to follow renal function, culture results, LOT, and antibiotic de-escalation plans   Height: 6' (182.9 cm) Weight: 81.7 kg (180 lb 3.2 oz) IBW/kg (Calculated) : 77.6  Temp (24hrs), Avg:98.1 F (36.7 C), Min:97.8 F (36.6 C), Max:98.3 F (36.8 C)  Recent Labs  Lab 07/25/22 0500 07/26/22 0240 07/27/22 1128 07/28/22 0224 07/29/22 0146 07/29/22 0830  WBC 9.6 9.7 9.5 8.5 8.1  --   CREATININE 1.06 1.18 1.13 1.16 1.21  --   LATICACIDVEN 0.8  --   --   --   --   --   VANCOTROUGH  --   --   --   --   --  14*  VANCOPEAK  --   --   --   --  25*  --     Estimated Creatinine Clearance: 71.3 mL/min (by C-G formula based on SCr of 1.21 mg/dL).    No Known Allergies  Antimicrobials this admission: Azithro 4/9 x 1 CRO 4/19 x 1 Vancomycin 4/10 >> 4/12; restart 4/23 >> Daptomycin 4/12 >> 4/14; restart 4/21 >> 4/22 Oritavancin 1200 mg 4/15 x 1   Microbiology results: 4/9 COVID >> neg 4/9 RVP >> neg 4/9 BCx >> 4/4 MRSA (S-Vanc/Dapto) 4/11 BCx >> ngF 4/21 BCx >> 2/2 MRSA (pending sensi)  Thank you for allowing pharmacy to be a part of this patient's care.  Georgina Pillion, PharmD, BCPS Infectious Diseases Clinical Pharmacist 07/29/2022 11:59 AM   **Pharmacist phone directory can now be found on [email or web address] ([email or web address]).  Listed under [facility].

## 2022-07-28 NOTE — Progress Notes (Signed)
PROGRESS NOTE    David [name]  ZOX:[id number] DOB: [birth year] DOA: 07/12/2022 PCP: [name], MD    Chief Complaint  Patient presents with   Shortness of Breath    Brief Narrative:  David [name] is an 62 y.o. 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 [name], essential hypertension, came into the ED with nonproductive cough shortness of breath and fever 105 was found to be septic due to MRSA bacteremia, due to severe back pain MRI of the T-spine and L-spine was done that showed nonspecific finding no osteomyelitis or discitis 2D echo showed a TEE without vegetation ID recommended 2 weeks of antibiotics given his financial situation he was given erythromycin.  Nephrology was consulted and he was started on Lasix twice a day and tolvaptan as he was hyponatremic.  MRI of the right foot was done that was concerning for osteomyelitis orthopedic surgery and infectious disease were reengage, they have recommended IV daptomycin.  He hypokalemia back pain repeated MRI of the lumbar and thoracic spine showed discitis and osteomyelitis of T8-T9 with some fluid collection anteriorly to T7 T9 concerning for multiloculated prevertebral abscess, enhancing material posterior to the thecal sac on T8-T9 concerning for phlegmon causing significant mass effect of the thecal sac surgery recommended transfer the patient to [facility] for further evaluation of his acute osteomyelitis and neurosurgery evaluation. Neurosurgery evaluated the patient recommended to consult IR for possible drainage and IV antibiotics.  Pulmonary was also consulted for concern of empyema recommended IR to be consulted for chest tube placement   Assessment & Plan:   Principal Problem:   Acute respiratory failure with hypoxia Active Problems:    Essential hypertension   Insulin dependent type 2 diabetes mellitus   Atrial fibrillation with RVR   Sleep apnea suspected   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)   Pure hypercholesterolemia   Hyponatremia   Hypomagnesemia   Osteomyelitis of fifth toe of right foot   Sepsis due to pneumonia   Bacteremia   Infective myositis   Severe protein-calorie malnutrition   PVD (peripheral vascular disease)   AKI (acute kidney injury)   Discitis thoracic region   MRSA bacteremia  #1 sepsis secondary to MRSA bacteremia in the setting of acute right foot infection with T8-T9 discitis and osteomyelitis with loculated peripherally enhancing fluid collection of T7-T9 concerning for abscess, T9 epidural phlegmon, large right pleural effusion concerning for empyema -Blood cultures obtained positive for 07/12/2022 on admission patient started on IV vancomycin Rocephin and azithromycin. -2D echo, TEE done negative for vegetations. -Surveillance blood cultures 07/13/2022 noted to be negative. -Repeat blood cultures 07/24/2022 positive for MRSA. -ESR of 87, CRP 15. -Neurosurgery consulted who felt no neurosurgical intervention recommended at this time and recommended IR consultation for catheter drainage. -Pulmonary consulted for evaluation of empyema and recommended IR consultation for chest tube placement with possible lytic therapy. -Chest tube placed by IR on 07/27/2022.  Cultures sent. -ID following, patient noted to be on vancomycin per ID would likely need prolonged treatment. -Patient noted to have PICC line in with persistent blood cultures and per ID this will need to be removed. -Discontinue PICC line. -Will need peripheral for IV access. -ID following and managing antibiotics and appreciate their input and recommendations.  2.  Right foot ulcer with osteomyelitis of residual fifth  metatarsal diaphysis -New osteomyelitis noted at prior amputation site. -MRI done consistent  with acute osteomyelitis of residual fifth metatarsal diaphysis extending to the level of the proximal metaphysis -ID reconsulted to evaluate patient patient on IV vancomycin. -Patient seen by orthopedics who recommended transfer to [facility]. -ABI obtained with mild disease noted in the right lower extremity. -Vascular surgery consulted and patient underwent angiography of the right lower extremity today and noted to have two-vessel runoff in the peroneal and posterior tibial vein status post right below-knee popliteal artery, tibioperoneal trunk and peroneal angioplasty.  -Continue to hold Eliquis. -Continue IV antibiotics per ID recommendations. -Per vascular and orthopedic surgery.  3.  Severe back pain secondary to acute T8-T9 osteomyelitis with loculated abscess -Patient seen in consultation by neurosurgery evaluated the patient and recommended no surgical intervention from their standpoint at this point in time NS recommended IV antibiotics as recommended by ID with consultation of IR for drainage. -Continue current pain regimen.  4.  Hypervolemic hyponatremia -On admission sodium noted at 121, nephrology consulted, patient started on tolvaptan. -Sodium levels improved. -Urine output of 1.2 L in the past 24 hours, patient is -21.315 L during this hospitalization. -Sodium currently at baseline of 126-130. -Seen by nephrology, was on IV Lasix which has been stopped and recommending continuation of oral Lasix with fluid restriction. -Nephrology was following but have signed off.  5.  AKI on CKD stage IIIa -Baseline creatinine approximately 1.3-1.7. -Renal function improved with creatinine currently at 1.16.    6.  Hypertension -Antihypertensive medications initially held secondary to soft/low blood pressure. -BP improved and currently stable on current regimen of Lasix and Coreg.   7.  Oral thrush -Nystatin swish and swallow.  8.  Insulin-dependent diabetes mellitus type  2 -Hemoglobin A1c 9.1 (07/13/2022) -CBG 191 this morning.   -Patient n.p.o. this morning for procedure.   -Continue current dose of Semglee, SSI, once tolerating adequate oral intake may need to uptitrate Semglee for better blood glucose control.   9.  Hypomagnesemia -Repleted.   -Magnesium at 2.1. -Repeat labs in the AM.  10.  Chronic A-fib -Continue Coreg for rate control.   -Eliquis held for vascular procedure today.   -Vascular surgery to advise when anticoagulation may be resumed.   11.  Severe protein calorie malnutrition -Nutritional supplementation.  12.  Acute on chronic combined systolic diastolic CHF -Patient was placed on IV diuresis patient - 21.315 L during his hospitalization has been transitioned to oral Lasix. -Continue Coreg, Lasix.   -Entresto on hold.   -Outpatient follow-up with cardiology.   13.  Acute confusional state -Continue melatonin, Seroquel as needed.  14.  Normocytic anemia -No signs of bleeding. -Hemoglobin stable at 8.9. -Nephrology ordered IV iron.    DVT prophylaxis: Anticoagulation on hold for procedure today.  Vascular to advise when anticoagulation may be resumed. Code Status: Full Family Communication: Updated patient.  No family at bedside. Disposition: TBD  Status is: Inpatient Remains inpatient appropriate because: Severity of illness   Consultants:  Wound care RN: Janit Pagan, RN 07/12/2022 ID: Dr.Comer 07/13/2022 Nephrology: Dr. Arlean Hopping 07/21/2022 Neurosurgery: Dr. Yetta Barre 07/25/2022 Orthopedics: Dr. [name] 07/26/2022 Vascular surgery: Dr. Myra Gianotti 07/26/2022 Interventional radiology: Dr. Loreta Ave 07/26/2022    Procedures:  CT angiogram chest 07/12/2022 CT abdomen and pelvis 07/12/2022 Chest x-ray 07/12/2022, 07/16/2022, 07/20/2022, 07/24/2022 PICC line placement 07/16/2022 MRI T/L-spine 07/25/2022 MRI right foot 07/24/2022 2D echo 07/13/2022 TEE 07/15/2022 ABI with TBI 07/26/2022 CT-guided placement of 12 French drainage catheter  placement into loculated  right-sided pleural effusion yielding 200 cc of blood-tinged fluid chest tube connected to Pleur-evac device--per IR, Dr. Grace Isaac 07/27/2022 Ultrasound-guided access left common femoral artery/aortogram with catheter selection of aorta/right lower extremity arteriogram with selection of third order branches/catheter selection of right SFA/right below-knee popliteal artery, tibial peroneal trunk and peroneal angioplasty/Mynx closure of left common femoral artery--per vascular surgery: Dr. Chestine Spore 07/28/2022  Antimicrobials:  Anti-infectives (From admission, onward)    Start     Dose/Rate Route Frequency Ordered Stop   07/26/22 2000  vancomycin (VANCOCIN) IVPB 1000 mg/200 mL premix        1,000 mg 200 mL/hr over 60 Minutes Intravenous Every 12 hours 07/26/22 1054     07/24/22 1845  DAPTOmycin (CUBICIN) 650 mg in sodium chloride 0.9 % IVPB  Status:  Discontinued        8 mg/kg  83.9 kg 126 mL/hr over 30 Minutes Intravenous Daily 07/24/22 1759 07/26/22 1050   07/18/22 1500  Oritavancin Diphosphate (ORBACTIV) 1,200 mg in dextrose 5 % IVPB        1,200 mg 333.3 mL/hr over 180 Minutes Intravenous Once 07/18/22 1351 07/18/22 1822   07/16/22 0000  daptomycin (CUBICIN) IVPB  Status:  Discontinued        650 mg Intravenous Every 24 hours 07/16/22 1116 07/19/22    07/15/22 2000  DAPTOmycin (CUBICIN) 650 mg in sodium chloride 0.9 % IVPB  Status:  Discontinued        8 mg/kg  81 kg 126 mL/hr over 30 Minutes Intravenous Daily 07/15/22 1411 07/15/22 1413   07/15/22 2000  DAPTOmycin (CUBICIN) 650 mg in sodium chloride 0.9 % IVPB  Status:  Discontinued        8 mg/kg  81 kg 126 mL/hr over 30 Minutes Intravenous Daily 07/15/22 1413 07/18/22 1351   07/14/22 0200  vancomycin (VANCOREADY) IVPB 1750 mg/350 mL  Status:  Discontinued        1,750 mg 175 mL/hr over 120 Minutes Intravenous Every 24 hours 07/13/22 0718 07/15/22 1411   07/13/22 1000  cefTRIAXone (ROCEPHIN) 1 g in sodium chloride  0.9 % 100 mL IVPB  Status:  Discontinued        1 g 200 mL/hr over 30 Minutes Intravenous Every 24 hours 07/12/22 1405 07/13/22 0239   07/13/22 1000  azithromycin (ZITHROMAX) 500 mg in sodium chloride 0.9 % 250 mL IVPB  Status:  Discontinued        500 mg 250 mL/hr over 60 Minutes Intravenous Every 24 hours 07/12/22 1405 07/13/22 0239   07/13/22 0330  vancomycin (VANCOREADY) IVPB 1750 mg/350 mL        1,750 mg 175 mL/hr over 120 Minutes Intravenous  Once 07/13/22 0240 07/13/22 0517   07/12/22 0845  cefTRIAXone (ROCEPHIN) 1 g in sodium chloride 0.9 % 100 mL IVPB        1 g 200 mL/hr over 30 Minutes Intravenous  Once 07/12/22 0844 07/12/22 1218   07/12/22 0845  azithromycin (ZITHROMAX) 500 mg in sodium chloride 0.9 % 250 mL IVPB        500 mg 250 mL/hr over 60 Minutes Intravenous  Once 07/12/22 0844 07/12/22 1218         Subjective: Patient drowsy, just returned from the OR.  Denies any chest pain or shortness of breath.   Pain currently controlled.  Objective: Vitals:   07/28/22 1335 07/28/22 1340 07/28/22 1345 07/28/22 1425  BP: (!) 149/80 132/88 (!) 125/95 (!) 158/99  Pulse: 74 79 76 (!) 109  Resp:  20 19 (!) 21 16  Temp:    98.2 F (36.8 C)  TempSrc:    Oral  SpO2: 96% 96%  99%  Weight:      Height:        Intake/Output Summary (Last 24 hours) at 07/28/2022 1430 Last data filed at 07/28/2022 0430 Gross per 24 hour  Intake 503.13 ml  Output 1225 ml  Net -721.87 ml    Filed Weights   07/24/22 0500 07/25/22 0500 07/28/22 0556  Weight: 83.9 kg 84.5 kg 82.2 kg    Examination:  General exam: Drowsy.  Postop. Respiratory system: CTAB anterior lung fields.  No wheezes, no crackles, no rhonchi.  Fair air movement.  Speaking in full sentences.  Normal respiratory effort.  Right-sided chest tube in place. Cardiovascular system: Regular rate rhythm no murmurs rubs or gallops.  No JVD.  Trace to 1+ right lower extremity edema.    Gastrointestinal system: Abdomen is soft,  nontender, nondistended, positive bowel sounds.  No rebound.  No guarding.  Central nervous system: Alert and oriented. No focal neurological deficits. Extremities: 1+ right lower extremity edema/right foot bandaged.  Skin: No rashes, lesions or ulcers Psychiatry: Judgement and insight appear normal. Mood & affect appropriate.     Data Reviewed: I have personally reviewed following labs and imaging studies  CBC: Recent Labs  Lab 07/24/22 0500 07/25/22 0500 07/26/22 0240 07/27/22 1128 07/28/22 0224  WBC 12.5* 9.6 9.7 9.5 8.5  NEUTROABS 9.6* 7.4 7.1 6.7 5.9  HGB 10.1* 9.5* 10.1* 9.5* 8.9*  HCT 30.5* 28.5* 29.0* 27.7* 25.4*  MCV 92.1 91.3 88.7 89.4 87.0  PLT 376 318 328 321 257     Basic Metabolic Panel: Recent Labs  Lab 07/23/22 0240 07/24/22 0500 07/24/22 1555 07/24/22 1700 07/25/22 0500 07/26/22 0240 07/27/22 0350 07/27/22 1128 07/28/22 0224  NA 130* 124*   < > 126* 126* 126*  --  127* 127*  K 3.9 4.2  --   --  3.8 4.0  --  3.6 3.5  CL 99 94*  --   --  95* 90*  --  90* 92*  CO2 24 23  --   --  23 24  --  25 25  GLUCOSE 206* 179*  --   --  209* 258*  --  139* 210*  BUN 21* 15  --   --  14 11  --  11 10  CREATININE 1.21 1.24  --   --  1.06 1.18  --  1.13 1.16  CALCIUM 8.5* 8.5*  --   --  8.4* 8.8*  --  8.9 8.3*  MG 2.1 1.7  --   --  1.7 1.5* 1.6*  --  2.1  PHOS 3.6 3.0  --   --  3.6 3.1  --   --  3.0   < > = values in this interval not displayed.     GFR: Estimated Creatinine Clearance: 74.3 mL/min (by C-G formula based on SCr of 1.16 mg/dL).  Liver Function Tests: Recent Labs  Lab 07/22/22 0306 07/23/22 0240 07/24/22 0500 07/25/22 0500 07/26/22 0240 07/28/22 0224  AST 25 26 25 25 28   --   ALT 29 31 32 31 35  --   ALKPHOS 130* 139* 140* 125 137*  --   BILITOT 1.0 0.7 0.7 0.8 0.4  --   PROT 6.5 6.9 6.9 6.8 6.7  --   ALBUMIN 2.1* 2.2* 1.9* 2.1* 1.8* 1.6*     CBG: Recent Labs  Lab 07/27/22 1117 07/27/22 1749 07/27/22 2045 07/28/22 0627  07/28/22 1108  GLUCAP 115* 167* 102* 191* 164*      Recent Results (from the past 240 hour(s))  Culture, blood (Routine X 2) w Reflex to ID Panel     Status: Abnormal   Collection Time: 07/24/22  3:55 PM   Specimen: BLOOD LEFT ARM  Result Value Ref Range Status   Specimen Description   Final    BLOOD LEFT ARM Performed at [facility], [address], [place], [place] [address]    Special Requests   Final    AEROBIC BOTTLE ONLY Blood Culture adequate volume Performed at [facility], [address] [place], [place] [address]    Culture  Setup Time   Final    GRAM POSITIVE COCCI IN CLUSTERS AEROBIC BOTTLE ONLY CRITICAL RESULT CALLED TO, READ BACK BY AND VERIFIED WITH: PHARMD T. JOHNSON 07/26/22 @ 0016 BY AB Performed at [facility], [address], [place], [place] [address]    Culture METHICILLIN RESISTANT STAPHYLOCOCCUS AUREUS (A)  Final   Report Status 07/27/2022 FINAL  Final   Organism ID, Bacteria METHICILLIN RESISTANT STAPHYLOCOCCUS AUREUS  Final      Susceptibility   Methicillin resistant staphylococcus aureus - MIC*    CIPROFLOXACIN >=8 RESISTANT Resistant     ERYTHROMYCIN >=8 RESISTANT Resistant     GENTAMICIN <=0.5 SENSITIVE Sensitive     OXACILLIN >=4 RESISTANT Resistant     TETRACYCLINE <=1 SENSITIVE Sensitive     VANCOMYCIN <=0.5 SENSITIVE Sensitive     TRIMETH/SULFA >=320 RESISTANT Resistant     CLINDAMYCIN <=0.25 SENSITIVE Sensitive     RIFAMPIN <=0.5 SENSITIVE Sensitive     Inducible Clindamycin NEGATIVE Sensitive     * METHICILLIN RESISTANT STAPHYLOCOCCUS AUREUS  Culture, blood (Routine X 2) w Reflex to ID Panel     Status: Abnormal   Collection Time: 07/24/22  3:55 PM   Specimen: BLOOD  Result Value Ref Range Status   Specimen Description   Final    BLOOD BLOOD RIGHT ARM Performed at [facility], [address] [place], [place] [address]    Special Requests   Final    AEROBIC BOTTLE ONLY Blood  Culture adequate volume Performed at [facility], [address], [place], [place] [address]    Culture  Setup Time   Final    GRAM POSITIVE COCCI IN CLUSTERS AEROBIC BOTTLE ONLY CRITICAL VALUE NOTED.  VALUE IS CONSISTENT WITH PREVIOUSLY REPORTED AND CALLED VALUE. CRITICAL RESULT CALLED TO, READ BACK BY AND VERIFIED WITH: PHARMD M.BELL AT 1315 ON 07/25/2022 BY T.SAAD.    Culture (A)  Final    STAPHYLOCOCCUS AUREUS SUSCEPTIBILITIES PERFORMED ON PREVIOUS CULTURE WITHIN THE LAST 5 DAYS. Performed at [facility], [address], [place], [place] [address]    Report Status 07/27/2022 FINAL  Final  Blood Culture ID Panel (Reflexed)     Status: Abnormal   Collection Time: 07/24/22  3:55 PM  Result Value Ref Range Status   Enterococcus faecalis NOT DETECTED NOT DETECTED Final   Enterococcus Faecium NOT DETECTED NOT DETECTED Final   Listeria monocytogenes NOT DETECTED NOT DETECTED Final   Staphylococcus species DETECTED (A) NOT DETECTED Final    Comment: CRITICAL RESULT CALLED TO, READ BACK BY AND VERIFIED WITH: PHARMD T. JOHNSON 07/26/22 @ 0016 BY AB    Staphylococcus aureus (BCID) DETECTED (A) NOT DETECTED Final    Comment: Methicillin (oxacillin)-resistant  Staphylococcus aureus (MRSA). MRSA is predictably resistant to beta-lactam antibiotics (except ceftaroline). Preferred therapy is vancomycin unless clinically contraindicated. Patient requires contact precautions if  hospitalized. CRITICAL RESULT CALLED TO, READ BACK BY AND VERIFIED WITH: PHARMD T. JOHNSON 07/26/22 @ 0016 BY AB    Staphylococcus epidermidis NOT DETECTED NOT DETECTED Final   Staphylococcus lugdunensis NOT DETECTED NOT DETECTED Final   Streptococcus species NOT DETECTED NOT DETECTED Final   Streptococcus agalactiae NOT DETECTED NOT DETECTED Final   Streptococcus pneumoniae NOT DETECTED NOT DETECTED Final   Streptococcus pyogenes NOT DETECTED NOT DETECTED Final   A.calcoaceticus-baumannii NOT  DETECTED NOT DETECTED Final   Bacteroides fragilis NOT DETECTED NOT DETECTED Final   Enterobacterales NOT DETECTED NOT DETECTED Final   Enterobacter cloacae complex NOT DETECTED NOT DETECTED Final   Escherichia coli NOT DETECTED NOT DETECTED Final   Klebsiella aerogenes NOT DETECTED NOT DETECTED Final   Klebsiella oxytoca NOT DETECTED NOT DETECTED Final   Klebsiella pneumoniae NOT DETECTED NOT DETECTED Final   Proteus species NOT DETECTED NOT DETECTED Final   Salmonella species NOT DETECTED NOT DETECTED Final   Serratia marcescens NOT DETECTED NOT DETECTED Final   Haemophilus influenzae NOT DETECTED NOT DETECTED Final   Neisseria meningitidis NOT DETECTED NOT DETECTED Final   Pseudomonas aeruginosa NOT DETECTED NOT DETECTED Final   Stenotrophomonas maltophilia NOT DETECTED NOT DETECTED Final   Candida albicans NOT DETECTED NOT DETECTED Final   Candida auris NOT DETECTED NOT DETECTED Final   Candida glabrata NOT DETECTED NOT DETECTED Final   Candida krusei NOT DETECTED NOT DETECTED Final   Candida parapsilosis NOT DETECTED NOT DETECTED Final   Candida tropicalis NOT DETECTED NOT DETECTED Final   Cryptococcus neoformans/gattii NOT DETECTED NOT DETECTED Final   Meth resistant mecA/C and MREJ DETECTED (A) NOT DETECTED Final    Comment: CRITICAL RESULT CALLED TO, READ BACK BY AND VERIFIED WITH: PHARMD T. JOHNSON 07/26/22 @ 0016 BY AB Performed at [facility], [address], [place], [place] [address]   Culture, blood (Routine X 2) w Reflex to ID Panel     Status: None (Preliminary result)   Collection Time: 07/27/22 12:15 PM   Specimen: BLOOD  Result Value Ref Range Status   Specimen Description BLOOD RIGHT ANTECUBITAL  Final   Special Requests   Final    BOTTLES DRAWN AEROBIC AND ANAEROBIC Blood Culture adequate volume   Culture   Final    NO GROWTH < 24 HOURS Performed at [facility], [address] [place], [place] [address]    Report Status PENDING  Incomplete   Culture, blood (Routine X 2) w Reflex to ID Panel     Status: None (Preliminary result)   Collection Time: 07/27/22 12:15 PM   Specimen: BLOOD  Result Value Ref Range Status   Specimen Description BLOOD RIGHT ANTECUBITAL  Final   Special Requests   Final    BOTTLES DRAWN AEROBIC AND ANAEROBIC Blood Culture adequate volume   Culture   Final    NO GROWTH < 24 HOURS Performed at [facility], [address] [place], [place] [address]    Report Status PENDING  Incomplete  Aerobic/Anaerobic Culture w Gram Stain (surgical/deep wound)     Status: None (Preliminary result)   Collection Time: 07/27/22  5:04 PM   Specimen: Abscess  Result Value Ref Range Status   Specimen Description ABSCESS CHEST RIGHT  Final   Special Requests DRAIN  Final   Gram Stain NO WBC  SEEN NO ORGANISMS SEEN   Final   Culture   Final    NO GROWTH < 12 HOURS Performed at [facility], [address] [place], [place] [address]    Report Status PENDING  Incomplete         Radiology Studies: No results found.      Scheduled Meds:  aspirin EC  81 mg Oral Daily   atorvastatin  80 mg Oral Daily   budesonide (PULMICORT) nebulizer solution  0.5 mg Nebulization BID   busPIRone  10 mg Oral BID   carvedilol  3.125 mg Oral BID   Chlorhexidine Gluconate Cloth  6 each Topical Daily   clopidogrel  75 mg Oral Q breakfast   digoxin  125 mcg Oral Daily   famotidine  20 mg Oral BID   furosemide  40 mg Oral Daily   insulin aspart  0-20 Units Subcutaneous TID WC   insulin glargine-yfgn  20 Units Subcutaneous Daily   lidocaine  1 patch Transdermal Daily   nystatin  5 mL Oral QID   senna-docusate  2 tablet Oral BID   sodium chloride flush  10-40 mL Intracatheter Q12H   sodium chloride flush  3 mL Intravenous Q12H   Continuous Infusions:  sodium chloride 100 mL/hr at 07/28/22 1428   sodium chloride     ferric gluconate (FERRLECIT) IVPB Stopped (07/27/22 1304)   vancomycin 1,000 mg (07/28/22 0917)      LOS: 16 days    Time spent: 40 minutes    Ramiro Harvest, MD [facility]   To contact the attending provider between 7A-7P or the covering provider during after hours 7P-7A, please log into the web site [email or web address] and access using universal [place] password for that web site. If you do not have the password, please call the hospital operator.  07/28/2022, 2:30 PM

## 2022-07-28 NOTE — Op Note (Signed)
Patient name: Eddy Liszewski MRN: [id number] DOB: Dec 25, 1960 Sex: male  07/28/2022 Pre-operative Diagnosis: Critical limb ischemia of the right lower extremity with osteomyelitis of right fifth metatarsal Post-operative diagnosis:  Same Surgeon:  Cephus Shelling, MD Procedure Performed: 1.  Ultrasound-guided access left common femoral artery 2.  Aortogram with catheter selection of aorta 3.  Right lower extremity arteriogram with selection of third order branches 4.  Catheter selection of right SFA 5.  Right below-knee popliteal artery, tibioperoneal trunk, and peroneal angioplasty (3 mm x 100 mm Sterling) 6.  Mynx closure of the left common femoral artery 7.  49 minutes of monitored moderate conscious sedation time  Indications: Patient is a 62 year old male that has previously undergone tibial intervention with Dr. Myra Gianotti.  He now has developed osteomyelitis of his right fifth metatarsal.  Presents for lower extremity arteriogram with possible intervention with a focus on the right leg after risks and benefits discussed.  Findings:   Aortogram showed no flow-limiting stenosis in the aortoiliac segment.  The left renal and right renal artery/accessory right renal were all patent.  The visualized portion of the SMA was patent.  Right lower extremity arteriogram showed a patent common femoral and profunda.  The SFA has some disease but no flow-limiting stenosis.  Above-knee popliteal artery is widely patent.  The below-knee popliteal artery had a focal 74% stenosis by measurement.  The TP trunk had a 95% stenosis into the proximal peroneal by measurement.  There was two-vessel runoff in the peroneal and posterior tibial artery.  Ultimately the below-knee popliteal artery, tibioperoneal trunk and proximal peroneal artery were treated with a 3 mm Sterling for 2 minutes.  Excellent results.  Widely patent vessel.  No significant residual stenosis and preserved two-vessel  runoff.   Procedure:  The patient was identified in the holding area and taken to room 8.  The patient was then placed supine on the table and prepped and draped in the usual sterile fashion.  A time out was called.  Patient received Versed and fentanyl for conscious moderate sedation.  Vital signs were monitored including heart rate, respiratory rate, oxygenation and blood pressure.  I was present for all of moderate sedation.  Ultrasound was used to evaluate the left common femoral artery.  It was patent .  A digital ultrasound image was acquired.  A micropuncture needle was used to access the left common femoral artery under ultrasound guidance.  An 018 wire was advanced without resistance and a micropuncture sheath was placed.  The 018 wire was removed and a benson wire was placed.  The micropuncture sheath was exchanged for a 5 french sheath.  An omniflush catheter was advanced over the wire to the level of L-1.  An abdominal angiogram was obtained.  Next, using the omniflush catheter and a benson wire, the aortic bifurcation was crossed and the catheter was placed into theright external iliac artery and right runoff was obtained.  Ultimately after evaluating images elected for intervention.  I used a Bentson wire down the right SFA and exchanged for a long 5 [place] Catapult sheath over the aortic bifurcation.  Patient was given 100 units/kg IV heparin.  I performed hand injections from a sheath in the distal SFA on the right to locate the lesions.  I then used a V18 wire and I was able to get down through the below-knee popliteal stenosis through the TP trunk stenosis into the peroneal artery.  I then elected to treat this with a long  3 mm Sterling to nominal pressure for 2 minutes.  Hand-injection after treatment showed widely patent below-knee popliteal tibioperoneal trunk and two-vessel runoff.  There was no significant residual stenosis.  Wires and catheters were removed.  Short 5 French sheath in the  left groin.  Mynx closure device deployed.  Plan: Good results after angioplasty today.  He has two-vessel runoff in the peroneal and posterior tibial.  Aspirin Plavix statin.  Cephus Shelling, MD [facility] Office: [phone]

## 2022-07-29 ENCOUNTER — Encounter (HOSPITAL_COMMUNITY): Payer: Self-pay | Admitting: Vascular Surgery

## 2022-07-29 ENCOUNTER — Inpatient Hospital Stay (HOSPITAL_COMMUNITY): Payer: Medicaid Other

## 2022-07-29 DIAGNOSIS — M86171 Other acute osteomyelitis, right ankle and foot: Secondary | ICD-10-CM

## 2022-07-29 LAB — RENAL FUNCTION PANEL
Albumin: 1.6 g/dL — ABNORMAL LOW (ref 3.5–5.0)
Anion gap: 11 (ref 5–15)
BUN: 10 mg/dL (ref 6–20)
CO2: 24 mmol/L (ref 22–32)
Calcium: 8.1 mg/dL — ABNORMAL LOW (ref 8.9–10.3)
Chloride: 92 mmol/L — ABNORMAL LOW (ref 98–111)
Creatinine, Ser: 1.21 mg/dL (ref 0.61–1.24)
GFR, Estimated: >60 mL/min (ref >60)
Glucose, Bld: 264 mg/dL — ABNORMAL HIGH (ref 70–99)
Phosphorus: 3.1 mg/dL (ref 2.5–4.6)
Potassium: 3.4 mmol/L — ABNORMAL LOW (ref 3.5–5.1)
Sodium: 127 mmol/L — ABNORMAL LOW (ref 135–145)

## 2022-07-29 LAB — CBC WITH DIFFERENTIAL/PLATELET
Abs Immature Granulocytes: 0.06 10*3/uL (ref 0.00–0.07)
Basophils Absolute: 0.1 10*3/uL (ref 0.0–0.1)
Basophils Relative: 1 %
Eosinophils Absolute: 0.4 10*3/uL (ref 0.0–0.5)
Eosinophils Relative: 5 %
HCT: 26.8 % — ABNORMAL LOW (ref 39.0–52.0)
Hemoglobin: 9 g/dL — ABNORMAL LOW (ref 13.0–17.0)
Immature Granulocytes: 1 %
Lymphocytes Relative: 12 %
Lymphs Abs: 0.9 10*3/uL (ref 0.7–4.0)
MCH: 30 pg (ref 26.0–34.0)
MCHC: 33.6 g/dL (ref 30.0–36.0)
MCV: 89.3 fL (ref 80.0–100.0)
Monocytes Absolute: 1.1 10*3/uL — ABNORMAL HIGH (ref 0.1–1.0)
Monocytes Relative: 14 %
Neutro Abs: 5.5 10*3/uL (ref 1.7–7.7)
Neutrophils Relative %: 67 %
Platelets: 268 10*3/uL (ref 150–400)
RBC: 3 MIL/uL — ABNORMAL LOW (ref 4.22–5.81)
RDW: 13.8 % (ref 11.5–15.5)
WBC: 8.1 10*3/uL (ref 4.0–10.5)
nRBC: 0 % (ref 0.0–0.2)

## 2022-07-29 LAB — GLUCOSE, CAPILLARY
Glucose-Capillary: 218 mg/dL — ABNORMAL HIGH (ref 70–99)
Glucose-Capillary: 206 mg/dL — ABNORMAL HIGH (ref 70–99)
Glucose-Capillary: 170 mg/dL — ABNORMAL HIGH (ref 70–99)
Glucose-Capillary: 244 mg/dL — ABNORMAL HIGH (ref 70–99)

## 2022-07-29 LAB — VANCOMYCIN, TROUGH: Vancomycin Tr: 14 ug/mL — ABNORMAL LOW (ref 15–20)

## 2022-07-29 LAB — MAGNESIUM: Magnesium: 1.7 mg/dL (ref 1.7–2.4)

## 2022-07-29 LAB — VANCOMYCIN, PEAK: Vancomycin Pk: 25 ug/mL — ABNORMAL LOW (ref 30–40)

## 2022-07-29 MED ORDER — METHOCARBAMOL 500 MG PO TABS: 500.0000 mg | ORAL_TABLET | Freq: Three times a day (TID) | ORAL | Status: DC

## 2022-07-29 MED ORDER — PREGABALIN 25 MG PO CAPS
25.0000 mg | ORAL_CAPSULE | Freq: Two times a day (BID) | ORAL | Status: DC
  Administered 2022-07-29 – 2022-07-30 (×2): 25 mg via ORAL
  Filled 2022-07-29 (×2): qty 1

## 2022-07-29 MED ORDER — METHOCARBAMOL 500 MG PO TABS
500.0000 mg | ORAL_TABLET | Freq: Three times a day (TID) | ORAL | Status: DC
  Administered 2022-07-29 – 2022-08-19 (×62): 500 mg via ORAL
  Filled 2022-07-29 (×65): qty 1

## 2022-07-29 NOTE — Progress Notes (Signed)
[facility]  Date of Admission:  07/12/2022      Total days of antibiotics 17   Vancomycin          ASSESSMENT: David [name] is a 62 y.o. male with recurrent MRSA bacteremia 2/2 right foot osteomyelitis involving 5th toe with evolved, discitis/osteomyelitis T8-T9 in the setting of loculated pleural fluid collection / prevertebral abscess.   Rt foot osteomyelitis 5th toe with ischemia, now s/p successful angiogram 4/25 -  -explained in depth this was initial source of his disseminated infection  -blood flow restored  -After extensive discussion, he would like to have another surgeon see him for revision / further surgery, discussed with Dr. Allena Katz patient's wishes -Explained that a more proximal definitive amputation would be curative approach for this infection he has struggled with for nearly a year now through prolonged antibiotics, offloading, wound care and resection surgeries. It is our hope to find a way to free him of this problem long term and avoid further spread of this infection.   T8-T9 Discitis with paraspinal abscess -  -continue medical management with prolonged IV course vancomycin -anticipate he needs at least a few more weeks of treatment IV given multiple other sources before considering transition to oral regimen   Rt Empyema -  -s/p perc chest tube 4/25.  -duration of tube TBD pending FU imaging   Bacteremia, MRSA - recurrent -  -Recurrent bacteremia 4/21 likely d/t poor source control and burden of infections -Pending blood cultures prelim clear now -PICC is out and will need replacement next week.  -At the time of my assessment no new concerns for metastatic sites  Discharge Planning -  -D/W case management personally barriers to discharge with HH and IV ABX -Uninsured  -when medically ready will FU  -he is open to staying here in the hospital if needed for IV tx as of today -planning to d/c to his sister's  house   Medication Monitoring -  -follow creatinine and vanc levels (AUC 498)     PLAN: Continue vancomycin  Follow pending bcx from 4/21 Timing to replace PICC pending #2 Appreciate CM team assistance with d/c barriers   I spent 90 minutes in direct discussion with the patient and his sister [name] answering in depth all of their questions related to the care of his foot and current admission, review of multiple studies including MRI of Lspine, CT of lungs, procedure notes from angiography and CT placement, lab trends over the last 72h, and personal communication with other providers pertaining to care decisions.     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)   Pure hypercholesterolemia   Hyponatremia   Hypomagnesemia   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)   Sepsis due to pneumonia (HCC)   Bacteremia   Infective myositis   Severe protein-calorie malnutrition (HCC)   PVD (peripheral vascular disease) (HCC)   AKI (acute kidney injury) (HCC)   Discitis thoracic region   MRSA bacteremia    aspirin EC  81 mg Oral Daily   atorvastatin  80 mg Oral Daily   budesonide (PULMICORT) nebulizer solution  0.5 mg Nebulization BID   busPIRone  10 mg Oral BID   carvedilol  3.125 mg Oral BID   Chlorhexidine Gluconate Cloth  6 each Topical Daily   clopidogrel  75 mg Oral Q breakfast   digoxin  125 mcg Oral Daily   famotidine  20 mg Oral BID   furosemide  40 mg Oral Daily   insulin aspart  0-20 Units Subcutaneous TID WC   insulin glargine-yfgn  20 Units Subcutaneous Daily   lidocaine  1 patch Transdermal Daily   nystatin  5 mL Oral QID   senna-docusate  2 tablet Oral BID   sodium chloride flush  10-40 mL Intracatheter Q12H   sodium  chloride flush  3 mL Intravenous Q12H    SUBJECTIVE: Several concerns to address - needs better pain regimen, would like a different surgical opinion for foot management, anxious about beeping/monitors. His sister, [name], joins [place] with his permission and has questions about overall trajectory and plans for treatment. Not sleeping which is making nerves worse and pain difficult.    Review of Systems: Review of Systems  Constitutional:  Negative for chills, fever and malaise/fatigue.  Respiratory:  Negative for cough. Shortness of breath: not as bad, but uncomfortable.  Cardiovascular:  Positive for chest pain (with chest tube in situ).  Gastrointestinal:  Negative for abdominal pain, nausea and vomiting.  Genitourinary: Negative.   Musculoskeletal:  Positive for back pain. Negative for joint pain.  Skin:  Negative for rash.  Psychiatric/Behavioral:  The patient is nervous/anxious and has insomnia.      No Known Allergies  OBJECTIVE: Vitals:   07/29/22 0500 07/29/22 0821 07/29/22 0902 07/29/22 1015  BP:   138/73   Pulse:  79 73 74  Resp:  18 16   Temp:   98.2 F (36.8 C)   TempSrc:   Oral   SpO2:  96% 94%   Weight: 81.7 kg     Height:       Body mass index is 24.44 kg/m.  Physical Exam Vitals reviewed.  Constitutional:      Appearance: He is well-developed. He is not ill-appearing.  Cardiovascular:     Rate and Rhythm: Normal rate and regular rhythm.  Pulmonary:     Effort: Pulmonary effort is normal.     Breath sounds: Normal breath sounds.     Comments: On room air Musculoskeletal:        General: Normal range of motion.  Skin:    Capillary Refill: Capillary refill takes less than 2 seconds.  Neurological:     Mental Status: He is alert and oriented to person, place, and time.  Psychiatric:        Mood and Affect: Mood is anxious.     Lab Results Lab Results  Component Value Date   WBC 8.1 07/29/2022   HGB 9.0 (L) 07/29/2022   HCT 26.8 (L) 07/29/2022    MCV 89.3 07/29/2022   PLT 268 07/29/2022    Lab Results  Component Value Date   CREATININE 1.21 07/29/2022   BUN 10 07/29/2022   NA 127 (L) 07/29/2022   K 3.4 (L) 07/29/2022   CL 92 (L) 07/29/2022   CO2 24 07/29/2022    Lab Results  Component Value Date   ALT 35 07/26/2022   AST 28 07/26/2022   ALKPHOS 137 (H) 07/26/2022   BILITOT 0.4 07/26/2022     Microbiology: Recent Results (from the past 240 hour(s))  Culture, blood (Routine X 2) w Reflex to ID Panel     Status: Abnormal   Collection Time: 07/24/22  3:55  PM   Specimen: BLOOD LEFT ARM  Result Value Ref Range Status   Specimen Description   Final    BLOOD LEFT ARM Performed at [facility], [address], [place], [place] [address]    Special Requests   Final    AEROBIC BOTTLE ONLY Blood Culture adequate volume Performed at [facility], [address], [place], [place] [address]    Culture  Setup Time   Final    GRAM POSITIVE COCCI IN CLUSTERS AEROBIC BOTTLE ONLY CRITICAL RESULT CALLED TO, READ BACK BY AND VERIFIED WITH: PHARMD T. JOHNSON 07/26/22 @ 0016 BY AB Performed at [facility], [address], [place], [place] [address]    Culture METHICILLIN RESISTANT STAPHYLOCOCCUS AUREUS (A)  Final   Report Status 07/27/2022 FINAL  Final   Organism ID, Bacteria METHICILLIN RESISTANT STAPHYLOCOCCUS AUREUS  Final      Susceptibility   Methicillin resistant staphylococcus aureus - MIC*    CIPROFLOXACIN >=8 RESISTANT Resistant     ERYTHROMYCIN >=8 RESISTANT Resistant     GENTAMICIN <=0.5 SENSITIVE Sensitive     OXACILLIN >=4 RESISTANT Resistant     TETRACYCLINE <=1 SENSITIVE Sensitive     VANCOMYCIN <=0.5 SENSITIVE Sensitive     TRIMETH/SULFA >=320 RESISTANT Resistant     CLINDAMYCIN <=0.25 SENSITIVE Sensitive     RIFAMPIN <=0.5 SENSITIVE Sensitive     Inducible Clindamycin NEGATIVE Sensitive     * METHICILLIN RESISTANT STAPHYLOCOCCUS AUREUS  Culture, blood (Routine X 2) w Reflex to  ID Panel     Status: Abnormal   Collection Time: 07/24/22  3:55 PM   Specimen: BLOOD  Result Value Ref Range Status   Specimen Description   Final    BLOOD BLOOD RIGHT ARM Performed at [facility], [address] [place], [place] [address]    Special Requests   Final    AEROBIC BOTTLE ONLY Blood Culture adequate volume Performed at [facility], [address] [place], [place] [address]    Culture  Setup Time   Final    GRAM POSITIVE COCCI IN CLUSTERS AEROBIC BOTTLE ONLY CRITICAL VALUE NOTED.  VALUE IS CONSISTENT WITH PREVIOUSLY REPORTED AND CALLED VALUE. CRITICAL RESULT CALLED TO, READ BACK BY AND VERIFIED WITH: PHARMD M.BELL AT 1315 ON 07/25/2022 BY T.SAAD.    Culture (A)  Final    STAPHYLOCOCCUS AUREUS SUSCEPTIBILITIES PERFORMED ON PREVIOUS CULTURE WITHIN THE LAST 5 DAYS. Performed at [facility], [address] [place], [place] [address]    Report Status 07/27/2022 FINAL  Final  Blood Culture ID Panel (Reflexed)     Status: Abnormal   Collection Time: 07/24/22  3:55 PM  Result Value Ref Range Status   Enterococcus faecalis NOT DETECTED NOT DETECTED Final   Enterococcus Faecium NOT DETECTED NOT DETECTED Final   Listeria monocytogenes NOT DETECTED NOT DETECTED Final   Staphylococcus species DETECTED (A) NOT DETECTED Final    Comment: CRITICAL RESULT CALLED TO, READ BACK BY AND VERIFIED WITH: PHARMD T. JOHNSON 07/26/22 @ 0016 BY AB    Staphylococcus aureus (BCID) DETECTED (A) NOT DETECTED Final    Comment: Methicillin (oxacillin)-resistant Staphylococcus aureus (MRSA). MRSA is predictably resistant to beta-lactam antibiotics (except ceftaroline). Preferred therapy is vancomycin unless clinically contraindicated. Patient requires contact precautions if  hospitalized. CRITICAL RESULT CALLED TO, READ BACK BY AND VERIFIED WITH: PHARMD T. JOHNSON 07/26/22 @ 0016 BY AB    Staphylococcus epidermidis NOT DETECTED NOT DETECTED Final  Staphylococcus lugdunensis NOT DETECTED NOT DETECTED Final   Streptococcus species NOT DETECTED NOT DETECTED Final   Streptococcus agalactiae NOT DETECTED NOT DETECTED Final   Streptococcus pneumoniae NOT DETECTED NOT DETECTED Final   Streptococcus pyogenes NOT DETECTED NOT DETECTED Final   A.calcoaceticus-baumannii NOT DETECTED NOT DETECTED Final   Bacteroides fragilis NOT DETECTED NOT DETECTED Final   Enterobacterales NOT DETECTED NOT DETECTED Final   Enterobacter cloacae complex NOT DETECTED NOT DETECTED Final   Escherichia coli NOT DETECTED NOT DETECTED Final   Klebsiella aerogenes NOT DETECTED NOT DETECTED Final   Klebsiella oxytoca NOT DETECTED NOT DETECTED Final   Klebsiella pneumoniae NOT DETECTED NOT DETECTED Final   Proteus species NOT DETECTED NOT DETECTED Final   Salmonella species NOT DETECTED NOT DETECTED Final   Serratia marcescens NOT DETECTED NOT DETECTED Final   Haemophilus influenzae NOT DETECTED NOT DETECTED Final   Neisseria meningitidis NOT DETECTED NOT DETECTED Final   Pseudomonas aeruginosa NOT DETECTED NOT DETECTED Final   Stenotrophomonas maltophilia NOT DETECTED NOT DETECTED Final   Candida albicans NOT DETECTED NOT DETECTED Final   Candida auris NOT DETECTED NOT DETECTED Final   Candida glabrata NOT DETECTED NOT DETECTED Final   Candida krusei NOT DETECTED NOT DETECTED Final   Candida parapsilosis NOT DETECTED NOT DETECTED Final   Candida tropicalis NOT DETECTED NOT DETECTED Final   Cryptococcus neoformans/gattii NOT DETECTED NOT DETECTED Final   Meth resistant mecA/C and MREJ DETECTED (A) NOT DETECTED Final    Comment: CRITICAL RESULT CALLED TO, READ BACK BY AND VERIFIED WITH: PHARMD T. JOHNSON 07/26/22 @ 0016 BY AB Performed at [facility], [address] [place], [place] [address]   Culture, blood (Routine X 2) w Reflex to ID Panel     Status: None (Preliminary result)   Collection Time: 07/27/22 12:15 PM   Specimen: BLOOD  Result Value Ref  Range Status   Specimen Description BLOOD RIGHT ANTECUBITAL  Final   Special Requests   Final    BOTTLES DRAWN AEROBIC AND ANAEROBIC Blood Culture adequate volume   Culture   Final    NO GROWTH 2 DAYS Performed at [facility], [address], [place], [place] [address]    Report Status PENDING  Incomplete  Culture, blood (Routine X 2) w Reflex to ID Panel     Status: None (Preliminary result)   Collection Time: 07/27/22 12:15 PM   Specimen: BLOOD  Result Value Ref Range Status   Specimen Description BLOOD RIGHT ANTECUBITAL  Final   Special Requests   Final    BOTTLES DRAWN AEROBIC AND ANAEROBIC Blood Culture adequate volume   Culture   Final    NO GROWTH 2 DAYS Performed at [facility], [address] [place], [place] [address]    Report Status PENDING  Incomplete  Aerobic/Anaerobic Culture w Gram Stain (surgical/deep wound)     Status: None (Preliminary result)   Collection Time: 07/27/22  5:04 PM   Specimen: Abscess  Result Value Ref Range Status   Specimen Description ABSCESS CHEST RIGHT  Final   Special Requests DRAIN  Final   Gram Stain NO WBC SEEN NO ORGANISMS SEEN   Final   Culture   Final    NO GROWTH 2 DAYS Performed at [facility], [address] [place], [place] [address]    Report Status PENDING  Incomplete    Rexene Alberts, MSN, NP-C [facility]  [email or web address]  Pager: [phone] Office: [phone] [facility] Main Line: [place] Communication Welcome

## 2022-07-29 NOTE — Progress Notes (Signed)
[facility] Progress Note Patient: David [name] ZOX:[id number] DOB: 03/16/1961 DOA: 07/12/2022  DOS: the patient was seen and examined on 07/29/2022  Brief hospital course: PMH of chronic combined CHF, chronic A-fib on Eliquis, type II DM, prior amputation of right foot, HTN.  Present to the hospital with complaints of fever.  Found to have MRSA bacteremia as well as multiple abscesses. Neurosurgery was consulted and recommend conservative measures. Nephrology was consulted for AKI and hyponatremia treated with tolvaptan. ID following. Orthopedic was consulted as well.  Recommended amputation.  Patient currently does not want to perform the procedure with Dr. [name]. Pulmonary was consulted for empyema.  IR was consulted for chest tube placement which is currently resolved on 4/26. Assessment and Plan: Sepsis secondary to MRSA bacteremia. Acute right foot infection. T8-T9 distress is as well as osteomyelitis. Bradycardia vertebral abscess and T7 T9. Epidural fragment on T9. Right-sided empyema. ID following. Blood cultures positive for MRSA. Currently antibiotic. Echocardiogram and TEE negative for vegetation. Neurosurgery recommended no surgical intervention. Will require repeat MRI. Pulmonary and IR were consulted.  Chest tube was placed on 4/24.  Removed on 4/26.  Will monitor. ID following. Recommended IV vancomycin. PICC line removed.  Pain control. Patient reports severe pain issues. Currently on IV Dilaudid as well as oxycodone. Add scheduled Robaxin as well as Lyrica. Monitor.  Right foot ulcer with osteomyelitis. Vascular surgery was consulted. Orthopedic was consulted. Currently recommendation is for amputation. Patient agreeable for the surgery but wants to discuss with other orthopedic other than Dr. [name].  PVD. Critical limb ischemia of the right leg. SP angioplasty on 4/25 with vascular surgery. Recommendation for aspirin Plavix and statin.  Hypervolemic  hyponatremia. Sodium level improving. Nephrology signed off. Monitor.  AKI on CKD 3A. Serum creatinine stable. Monitor.  HTN. Blood pressure stable. Currently continuing current regimen.  Oral thrush. Nystatin. Resolved.  Type of diabetes mellitus uncontrolled with hyperglycemia with long-term insulin use. Currently on sliding scale insulin. Monitor. Hemoglobin A1c 9.1.  Chronic A-fib. Currently rate controlled. Monitor. Eliquis on hold.  Resume per vascular surgery.  Protein-calorie malnutrition. Continue supplementation.  Acute on chronic combined CHF. Received IV diuresis. Patient was on Entresto currently on hold. Currently on Coreg and Lasix. Monitor.  Anemia. H&H regular stable.  Monitor.  No active bleeding.  Subjective: No nausea no vomiting.  No fever no chills.  Reports severe pain.  Physical Exam: General: in Mild distress, No Rash Cardiovascular: S1 and S2 Present, No Murmur Respiratory: Good respiratory effort, Bilateral Air entry present. No Crackles, No wheezes Abdomen: Bowel Sound present, No tenderness Extremities: Trace edema Neuro: Alert and oriented x3, no new focal deficit  Data Reviewed: I have Reviewed nursing notes, Vitals, and Lab results. Since last encounter, pertinent lab results CBC and BMP   . I have ordered test including CBC and BMP  . I have discussed pt's care plan and test results with ID  .  Disposition: Status is: Inpatient Remains inpatient appropriate because: Need for IV antibiotics and further workup  Family Communication: No one at bedside Level of care: Telemetry Surgical   Vitals:   07/29/22 0821 07/29/22 0902 07/29/22 1015 07/29/22 1558  BP:  138/73  130/84  Pulse: 79 73 74 66  Resp: 18 16  16   Temp:  98.2 F (36.8 C)  97.6 F (36.4 C)  TempSrc:  Oral  Oral  SpO2: 96% 94%  98%  Weight:      Height:  Author: Lynden Oxford, MD 07/29/2022 8:05 PM  Please look on [email or web address] to find out who is  on call.

## 2022-07-29 NOTE — Progress Notes (Signed)
Patient ID: David [name], male   DOB: Jun 09, 1960, 62 y.o.   MRN: [id number] Patient is seen in follow-up for osteomyelitis fifth ray right foot.  Patient is status post endovascular reconstruction with Dr. Myra Gianotti.  Patient states he has an emergency going on and he will not consider surgery today.  I would be available to proceed with surgery next Friday. I could follow-up in the office if patient safe for discharge.

## 2022-07-29 NOTE — Progress Notes (Signed)
Referring Physician(s): Dr. David Stall  Supervising Physician: Roanna Banning  Patient Status:  [facility] - In-pt  Chief Complaint: Right empyema s/p chest tube placement in IR 07/27/22  Subjective: Patient working with nurses to get out of bed to use the bathroom. Patient with multiple complaints about numerous body parts that are hurting - the chest tube site, his foot, his back, his buttocks from being in the bed, etc.   Allergies: Patient has no known allergies.  Medications: Prior to Admission medications   Medication Sig Start Date End Date Taking? Authorizing Provider  apixaban (ELIQUIS) 5 MG TABS tablet Take 1 tablet (5 mg total) by mouth 2 (two) times daily. 05/10/21  Yes Tereso Newcomer T, PA-C  aspirin EC 81 MG tablet Take 1 tablet (81 mg total) by mouth daily. Swallow whole. 09/07/21  Yes Burnadette Pop, MD  atorvastatin (LIPITOR) 80 MG tablet Take 1 tablet (80 mg total) by mouth daily. 05/17/22  Yes [name], MD  busPIRone (BUSPAR) 10 MG tablet Take 1 tablet (10 mg total) by mouth 2 (two) times daily. Patient taking differently: Take 10 mg by mouth daily as needed (For anxiety). 12/16/21  Yes Georgian Co M, PA-C  carvedilol (COREG) 6.25 MG tablet Take 1 tablet (6.25 mg total) by mouth 2 (two) times daily. 07/01/22  Yes Sharlene Dory, PA-C  clopidogrel (PLAVIX) 75 MG tablet Take 1 tablet (75 mg total) by mouth daily with breakfast. 04/13/22  Yes Newlin, Enobong, MD  digoxin (LANOXIN) 0.125 MG tablet Take 1 tablet (125 mcg total) by mouth once daily. 06/03/22  Yes Wendall Stade, MD  famotidine (PEPCID) 20 MG tablet Take 1 tablet (20 mg total) by mouth 2 times daily. 03/24/22  Yes   fluticasone (FLONASE) 50 MCG/ACT nasal spray Place 2 sprays into both nostrils daily. 02/01/22  Yes [name], MD  furosemide (LASIX) 40 MG tablet Take 1 tablet (40 mg total) by mouth daily. 05/17/22  Yes [name], MD  Insulin Glargine (BASAGLAR KWIKPEN) 100 UNIT/ML Inject 40 Units into  the skin daily. 04/13/22  Yes Newlin, Enobong, MD  insulin lispro (HUMALOG KWIKPEN) 100 UNIT/ML KwikPen Inject 15 Units into the skin 3 (three) times daily. 04/13/22  Yes Newlin, Odette Horns, MD  sacubitril-valsartan (ENTRESTO) 49-51 MG Take 1 tablet by mouth 2 (two) times daily. Needs to see cardiology for refills Patient taking differently: Take 1 tablet by mouth 2 (two) times daily. 01/04/22  Yes Claiborne Rigg, NP  spironolactone (ALDACTONE) 25 MG tablet Take 1 tablet (25 mg total) by mouth daily. 12/16/21 07/20/22 Yes McClung, Marzella Schlein, PA-C  HYDROcodone-acetaminophen (NORCO/VICODIN) 5-325 MG tablet Take 1 tablet by mouth every 4 (four) hours as needed for up to 7 days for Moderate pain (4-6) or Severe pain (7-10). Patient not taking: Reported on 07/12/2022 03/24/22        Vital Signs: BP 138/73 (BP Location: Right Leg)   Pulse 74   Temp 98.2 F (36.8 C) (Oral)   Resp 16   Ht 6' (1.829 m)   Wt 180 lb 3.2 oz (81.7 kg)   SpO2 94%   BMI 24.44 kg/m   Physical Exam Constitutional:      General: He is not in acute distress. Pulmonary:     Effort: Pulmonary effort is normal.     Comments: Right chest tube to suction. Serosanguineous fluid in pleurevac. Tenderness to palpation around skin insertion site.  Skin:    General: Skin is warm and dry.  Neurological:  Mental Status: He is alert and oriented to person, place, and time.     Imaging: DG CHEST PORT 1 VIEW  Result Date: 07/29/2022 CLINICAL DATA:  Chest tube in place. EXAM: PORTABLE CHEST 1 VIEW COMPARISON:  Radiographs 07/24/2022 and 07/20/2022.  CT 07/12/2022. FINDINGS: 0918 hours. Interval placement of a small caliber pigtail catheter laterally in the right pleural space. The right pleural effusion has decreased in volume. The right arm PICC has been removed in the interval. There is mildly improved aeration of the right lung base. No evidence of pneumothorax. The left lung is clear. The heart size and mediastinal contours are  stable with cardiomegaly and aortic atherosclerosis. IMPRESSION: 1. Decreased right pleural effusion following chest tube placement. No evidence of pneumothorax. 2. Mildly improved aeration of the right lung base. Electronically Signed   By: Carey Bullocks M.D.   On: 07/29/2022 11:37   PERIPHERAL VASCULAR CATHETERIZATION  Result Date: 07/28/2022 Images from the original result were not included.   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  Ultrasound-guided access left common femoral artery 2.  Aortogram with catheter selection of aorta 3.  Right lower extremity arteriogram with selection of third order branches 4.  Catheter selection of right SFA 5.  Right below-knee popliteal artery, tibioperoneal trunk, and peroneal angioplasty (3 mm x 100 mm Sterling) 6.  Mynx closure of the left common femoral artery 7.  49 minutes of monitored moderate conscious sedation time  Indications: Patient is a 62 year old male that has previously undergone tibial intervention with Dr. Myra Gianotti.  He now has developed osteomyelitis of his right fifth metatarsal.  Presents for lower extremity arteriogram with possible intervention with a focus on the right leg after risks and benefits discussed.  Findings:  Aortogram showed no flow-limiting stenosis in the aortoiliac segment.  The left renal and right renal artery/accessory right renal were all patent.  The visualized portion of the SMA was patent.  Right lower extremity arteriogram showed a patent common femoral and profunda.  The SFA has some disease but no flow-limiting stenosis.  Above-knee popliteal artery is widely patent.  The below-knee popliteal artery had a focal 74% stenosis by measurement.  The TP trunk had  a 95% stenosis into the proximal peroneal by measurement.  There was two-vessel runoff in the peroneal and posterior tibial artery.  Ultimately the below-knee popliteal artery, tibioperoneal trunk and proximal peroneal artery were treated with a 3 mm Sterling for 2 minutes.  Excellent results.  Widely patent vessel.  No significant residual stenosis and preserved two-vessel runoff.             Procedure:  The patient was identified in the holding area and taken to room 8.  The patient was then placed supine on the table and prepped and draped in the usual sterile fashion.  A time out was called.  Patient received Versed and fentanyl for conscious moderate sedation.  Vital signs were monitored including heart rate, respiratory rate, oxygenation and blood pressure.  I was present for all of moderate sedation.  Ultrasound was used to evaluate the left common femoral artery.  It was patent .  A digital ultrasound image was  acquired.  A micropuncture needle was used to access the left common femoral artery under ultrasound guidance.  An 018 wire was advanced without resistance and a micropuncture sheath was placed.  The 018 wire was removed and a benson wire was placed.  The micropuncture sheath was exchanged for a 5 french sheath.  An omniflush catheter was advanced over the wire to the level of L-1.  An abdominal angiogram was obtained.  Next, using the omniflush catheter and a benson wire, the aortic bifurcation was crossed and the catheter was placed into theright external iliac artery and right runoff was obtained.  Ultimately after evaluating images elected for intervention.  I used a Bentson wire down the right SFA and exchanged for a long 5 [place] Catapult sheath over the aortic bifurcation.  Patient was given 100 units/kg IV heparin.  I performed hand injections from a sheath in the distal SFA on the right to locate the lesions.  I then used a V18 wire and I was able to get down through the below-knee popliteal  stenosis through the TP trunk stenosis into the peroneal artery.  I then elected to treat this with a long 3 mm Sterling to nominal pressure for 2 minutes.  Hand-injection after treatment showed widely patent below-knee popliteal tibioperoneal trunk and two-vessel runoff.  There was no significant residual stenosis.  Wires and catheters were removed.  Short 5 French sheath in the left groin.  Mynx closure device deployed.  Plan: Good results after angioplasty today.  He has two-vessel runoff in the peroneal and posterior tibial.  Aspirin Plavix statin.  Cephus Shelling, MD [facility] Office: [phone]   CT [facility] PLEURAL DRAIN W/INDWELL CATH W/IMG GUIDE  Result Date: 07/28/2022 INDICATION: Loculated right-sided pleural effusion. Please perform image guided right-sided chest tube placement for infection source control purposes. EXAM: ULTRASOUND AND CT-GUIDED RIGHT-SIDED CHEST TUBE PLACEMENT COMPARISON:  Thoracic spine MRI-07/05/2022 Chest CT-07/12/2022 MEDICATIONS: The patient is currently admitted to the hospital and receiving intravenous antibiotics. The antibiotics were administered within an appropriate time frame prior to the initiation of the procedure. ANESTHESIA/SEDATION: Moderate (conscious) sedation was employed during this procedure. A total of 100 mcg Fentanyl was administered intravenously. Moderate Sedation Time: 25 minutes. The patient's level of consciousness and vital signs were monitored continuously by radiology nursing throughout the procedure under my direct supervision. CONTRAST:  None COMPLICATIONS: None immediate. PROCEDURE: RADIATION DOSE REDUCTION: This exam was performed according to the departmental dose-optimization program which includes automated exposure control, adjustment of the mA and/or kV according to patient size and/or use of iterative reconstruction technique. Informed written consent was obtained from the patient after a discussion of  the risks, benefits and alternatives to treatment. The patient was placed supine on the CT gantry and a pre procedural CT was performed re-demonstrating the known partially loculated small to moderate-sized right-sided pleural effusion. CT gantry table position was marked and the complex fluid collection was identified sonographically. The procedure was planned. A timeout was performed prior to the initiation of the procedure. The inferolateral aspect of the right chest was prepped and draped in the usual sterile fashion. The overlying soft tissues were anesthetized with 1% lidocaine with epinephrine. Under direct ultrasound guidance, an 18 gauge trocar needle was advanced into the complex right-sided pleural effusion and a short Amplatz super stiff wire was coiled within the collection. Appropriate positioning was confirmed with a limited CT scan (series 3). The tract was serially dilated allowing placement of a 12  [place] all-purpose drainage catheter. Appropriate positioning was confirmed with a limited postprocedural CT scan (series 4). Next, approximately 200 cc blood-tinged fluid was aspirated. The tube was connected to a pleura vac device and sutured in place. A dressing was applied. The patient tolerated the procedure well without immediate post procedural complication. IMPRESSION: Successful CT guided placement of a 23 French all purpose drain catheter into the complex right-sided pleural effusion with aspiration of 12 mL of blood-tinged fluid. Samples were sent to the laboratory as requested by the ordering clinical team. Electronically Signed   By: Simonne Come M.D.   On: 07/28/2022 15:11   VAS [place] ABI WITH/WO TBI  Result Date: 07/26/2022  LOWER EXTREMITY DOPPLER STUDY Patient Name:  NAEL PETROSYAN  Date of Exam:   07/26/2022 Medical Rec #: [id number]    Accession #:    [id number] Date of Birth: 05-04-1960     Patient Gender: M Patient Age:   19 years Exam Location:  [facility] Procedure:      VAS [place]  ABI WITH/WO TBI Referring Phys: MARCUS DUDA --------------------------------------------------------------------------------  Indications: Gangrene, and peripheral artery disease. High Risk Factors: Hypertension, Diabetes.  Vascular Interventions: 03/20/2022 - Right RAY RESECTION FOOT. Limitations: Today's exam was limited due to bandages , involuntary patient              movement, an open wound and Right restricted arm. Comparison Study: 01/05/2022 - Right: Resting right ankle-brachial index                   indicates mild right lower                   extremity arterial disease. The right toe-brachial index is                   abnormal.                    Left: Resting left ankle-brachial index indicates moderate                   left lower                   extremity arterial disease. The left toe-brachial index is                   abnormal. Performing Technologist: Olen Cordial RVT  Examination Guidelines: A complete evaluation includes at minimum, Doppler waveform signals and systolic blood pressure reading at the level of bilateral brachial, anterior tibial, and posterior tibial arteries, when vessel segments are accessible. Bilateral testing is considered an integral part of a complete examination. Photoelectric Plethysmograph (PPG) waveforms and toe systolic pressure readings are included as required and additional duplex testing as needed. Limited examinations for reoccurring indications may be performed as noted.  ABI Findings: +---------+------------------+-----+----------+--------------+ Right    Rt Pressure (mmHg)IndexWaveform  Comment        +---------+------------------+-----+----------+--------------+ Brachial                                  Restricted arm +---------+------------------+-----+----------+--------------+ PTA      113               0.90 monophasic               +---------+------------------+-----+----------+--------------+ DP       113  0.90  monophasic               +---------+------------------+-----+----------+--------------+ Great Toe                       Absent                   +---------+------------------+-----+----------+--------------+ +---------+------------------+-----+-----------+-------+ Left     Lt Pressure (mmHg)IndexWaveform   Comment +---------+------------------+-----+-----------+-------+ Brachial 125                    triphasic          +---------+------------------+-----+-----------+-------+ PTA      122               0.98 multiphasic        +---------+------------------+-----+-----------+-------+ DP       114               0.91 multiphasic        +---------+------------------+-----+-----------+-------+ Great Toe                       Absent             +---------+------------------+-----+-----------+-------+  Summary: Right: Resting right ankle-brachial index indicates mild right lower extremity arterial disease. Unable to obtain TBI due to inconsistent/absent waveforms. Left: Resting left ankle-brachial index is within normal range. Unable to obtain TBI due to inconsistent/absent waveforms. *See table(s) above for measurements and observations.  Electronically signed by Coral Else MD on 07/26/2022 at 7:27:39 PM.    Final    MR THORACIC SPINE W WO CONTRAST  Result Date: 07/25/2022 CLINICAL DATA:  Follow-up MR for MRSA bacteremia and concern for seeding EXAM: MRI THORACIC AND LUMBAR SPINE WITHOUT AND WITH CONTRAST TECHNIQUE: Multiplanar and multiecho pulse sequences of the thoracic and lumbar spine were obtained without and with intravenous contrast. CONTRAST:  8mL GADAVIST GADOBUTROL 1 MMOL/ML IV SOLN COMPARISON:  07/13/2022 MRI thoracic and lumbar spine FINDINGS: MRI THORACIC SPINE FINDINGS Alignment: Mild scoliosis and exaggeration of the normal thoracic kyphosis. No significant listhesis. Vertebrae: Decreased T1 signal, increased T2 signal, and contrast enhancement throughout the T8  and T11 vertebral bodies, with increased fluid signal and enhancement in the T8-T9 disc space (series 21, image 12 and series 25, image 12), which is new from the prior exam. No acute fracture or suspicious osseous lesion. Cord: T1 hypointense, contrast enhancing material along the anterior aspect of the thecal sac posterior to T8 and T9 (series 23 and 26, images 24-27), most likely phlegmon. There is likely phlegmon along the right aspect of the thecal sac posterior to T9 (series 25, image 11), although this area is not covered well on the axial sequences. This does not cause significant mass effect on the thecal sac. Prominent epidural fat also does not cause significant mass effect on the thecal sac. No abnormal spinal cord enhancement. Paraspinal and other soft tissues: Anterior to the T7-T9 vertebral bodies, there is a loculated, peripherally enhancing fluid collection, which measures up to 3.1 x 5.9 x 5.9 cm (AP x TR x CC) (series 26, image 23 and 24; series 25, image 5), concerning for multiloculated prevertebral abscess. In addition, there is a large right pleural effusion with a thick enhancing rim, concerning for empyema (series 26, image 23). Disc levels: Mild degenerative changes, without significant spinal canal stenosis or neural foraminal narrowing. MRI LUMBAR SPINE FINDINGS Segmentation:  5 lumbar type vertebral bodies. Alignment:  Mild levoscoliosis.  No  significant listhesis. Vertebrae: No acute fracture, evidence of discitis, suspicious osseous lesion, or abnormal osseous enhancement. Conus medullaris: Extends to the L1 level and appears normal. No evidence of epidural collection. No abnormal enhancement. Paraspinal and other soft tissues: No acute finding. Redemonstrated increased T2 signal in the paraspinous musculature (series 2, images 3 and 9), without abnormal in his is no focal fluid collection in the paraspinous muscles. Disc levels: Degenerative changes are stable from the 07/13/2022  exam. Please see that report for detailed findings. IMPRESSION: 1. Findings concerning for discitis osteomyelitis at T8-T9, with a loculated, peripherally enhancing fluid collection anterior to the T7-T9 vertebral bodies, concerning for a multiloculated prevertebral abscess. 2. Enhancing material along the anterior aspect of the thecal sac posterior to T8 and T9, and likely along the right aspect of the thecal sac at T9, concerning for epidural phlegmon, which does not cause significant mass effect on the thecal sac. 3. Large right pleural effusion with a thick enhancing rim, concerning for empyema. 4. No evidence of discitis or osteomyelitis in the lumbar spine. These results will be called to the ordering clinician or representative by the Radiologist Assistant, and communication documented in the PACS or [employer]. Electronically Signed   By: Wiliam Ke M.D.   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 CONTRAST:  8mL GADAVIST GADOBUTROL 1 MMOL/ML IV SOLN COMPARISON:  07/13/2022 MRI thoracic and lumbar spine FINDINGS: MRI THORACIC SPINE FINDINGS Alignment: Mild scoliosis and exaggeration of the normal thoracic kyphosis. No significant listhesis. Vertebrae: Decreased T1 signal, increased T2 signal, and contrast enhancement throughout the T8 and T11 vertebral bodies, with increased fluid signal and enhancement in the T8-T9 disc space (series 21, image 12 and series 25, image 12), which is new from the prior exam. No acute fracture or suspicious osseous lesion. Cord: T1 hypointense, contrast enhancing material along the anterior aspect of the thecal sac posterior to T8 and T9 (series 23 and 26,  images 24-27), most likely phlegmon. There is likely phlegmon along the right aspect of the thecal sac posterior to T9 (series 25, image 11), although this area is not covered well on the axial sequences. This does not cause significant mass effect on the thecal sac. Prominent epidural fat also does not cause significant mass effect on the thecal sac. No abnormal spinal cord enhancement. Paraspinal and other soft tissues: Anterior to the T7-T9 vertebral bodies, there is a loculated, peripherally enhancing fluid collection, which measures up to 3.1 x 5.9 x 5.9 cm (AP x TR x CC) (series 26, image 23 and 24; series 25, image 5), concerning for multiloculated prevertebral abscess. In addition, there is a large right pleural effusion with a thick enhancing rim, concerning for empyema (series 26, image 23). Disc levels: Mild degenerative changes, without significant spinal canal stenosis or neural foraminal narrowing. MRI LUMBAR SPINE FINDINGS Segmentation:  5 lumbar type vertebral bodies. Alignment:  Mild levoscoliosis.  No significant listhesis. Vertebrae: No acute fracture, evidence of discitis, suspicious osseous lesion, or abnormal osseous enhancement. Conus medullaris: Extends to the L1 level and appears normal. No evidence of epidural collection. No abnormal enhancement. Paraspinal and other soft tissues: No acute finding. Redemonstrated increased T2 signal in the paraspinous musculature (series  2, images 3 and 9), without abnormal in his is no focal fluid collection in the paraspinous muscles. Disc levels: Degenerative changes are stable from the 07/13/2022 exam. Please see that report for detailed findings. IMPRESSION: 1. Findings concerning for discitis osteomyelitis at T8-T9, with a loculated, peripherally enhancing fluid collection anterior to the T7-T9 vertebral bodies, concerning for a multiloculated prevertebral abscess. 2. Enhancing material along the anterior aspect of the thecal sac posterior to T8 and  T9, and likely along the right aspect of the thecal sac at T9, concerning for epidural phlegmon, which does not cause significant mass effect on the thecal sac. 3. Large right pleural effusion with a thick enhancing rim, concerning for empyema. 4. No evidence of discitis or osteomyelitis in the lumbar spine. These results will be called to the ordering clinician or representative by the Radiologist Assistant, and communication documented in the PACS or [employer]. Electronically Signed   By: Wiliam Ke M.D.   On: 07/25/2022 16:55    Labs:  CBC: Recent Labs    07/26/22 0240 07/27/22 1128 07/28/22 0224 07/29/22 0146  WBC 9.7 9.5 8.5 8.1  HGB 10.1* 9.5* 8.9* 9.0*  HCT 29.0* 27.7* 25.4* 26.8*  PLT 328 321 257 268    COAGS: Recent Labs    08/30/21 0004 09/01/21 0454 09/01/21 1358 09/02/21 0329 09/03/21 0314 09/04/21 0419 07/26/22 0625  INR 1.2  --   --   --   --   --  1.7*  APTT 33   < > 78* 59* 77* 68*  --    < > = values in this interval not displayed.    BMP: Recent Labs    07/26/22 0240 07/27/22 1128 07/28/22 0224 07/29/22 0146  NA 126* 127* 127* 127*  K 4.0 3.6 3.5 3.4*  CL 90* 90* 92* 92*  CO2 24 25 25 24   GLUCOSE 258* 139* 210* 264*  BUN 11 11 10 10   CALCIUM 8.8* 8.9 8.3* 8.1*  CREATININE 1.18 1.13 1.16 1.21  GFRNONAA >60 >60 >60 >60    LIVER FUNCTION TESTS: Recent Labs    07/23/22 0240 07/24/22 0500 07/25/22 0500 07/26/22 0240 07/28/22 0224 07/29/22 0146  BILITOT 0.7 0.7 0.8 0.4  --   --   AST 26 25 25 28   --   --   ALT 31 32 31 35  --   --   ALKPHOS 139* 140* 125 137*  --   --   PROT 6.9 6.9 6.8 6.7  --   --   ALBUMIN 2.2* 1.9* 2.1* 1.8* 1.6* 1.6*    Assessment and Plan:  Right empyema s/p chest tube placement in IR 07/27/22  200 ml removed during the procedure. 25 ml additional output since drain placed. CXR today shows:   IMPRESSION: 1. Decreased right pleural effusion following chest tube placement. No evidence of  pneumothorax. 2. Mildly improved aeration of the right lung base  Patient is afebrile and without leukocytosis. Imaging reviewed by Dr. Milford Cage who approved patient for chest tube removal. Chest tube removed at 3:40 pm. Patient tolerated removal well. Site continued to leak - dermabond applied and leak stopped. Post-tube removal chest x-ray ordered for 5 pm.   Please call IR with any questions.   Electronically Signed: Alwyn Ren, AGACNP-BC [phone] 07/29/2022, 12:22 PM   I spent a total of 15 Minutes at the the patient's bedside AND on the patient's hospital floor or unit, greater than 50% of which was counseling/coordinating care for right chest tube.

## 2022-07-29 NOTE — Progress Notes (Addendum)
  Progress Note    07/29/2022 9:02 AM 1 Day Post-Op  Subjective:  having pain from chest tube     Vitals:   07/29/22 0333 07/29/22 0821  BP: 126/78   Pulse: 68 79  Resp: 20 18  Temp:    SpO2: 96% 96%    Physical Exam: General:  resting comfortably Cardiac:  regular, right sided chest tube in place Lungs:  nonlabored Incisions:  L groin cath site intact without hematoma Extremities:  right 5th toe wound. Right peroneal/PT doppler signals   CBC    Component Value Date/Time   WBC 8.1 07/29/2022 0146   RBC 3.00 (L) 07/29/2022 0146   HGB 9.0 (L) 07/29/2022 0146   HGB 17.3 06/16/2021 1658   HCT 26.8 (L) 07/29/2022 0146   HCT 47.0 06/16/2021 1658   PLT 268 07/29/2022 0146   PLT 244 06/16/2021 1658   MCV 89.3 07/29/2022 0146   MCV 91 06/16/2021 1658   MCH 30.0 07/29/2022 0146   MCHC 33.6 07/29/2022 0146   RDW 13.8 07/29/2022 0146   RDW 12.7 06/16/2021 1658   LYMPHSABS 0.9 07/29/2022 0146   LYMPHSABS 1.7 11/16/2018 1128   MONOABS 1.1 (H) 07/29/2022 0146   EOSABS 0.4 07/29/2022 0146   EOSABS 0.4 11/16/2018 1128   BASOSABS 0.1 07/29/2022 0146   BASOSABS 0.1 11/16/2018 1128    BMET    Component Value Date/Time   NA 127 (L) 07/29/2022 0146   NA 131 (L) 06/16/2021 1658   K 3.4 (L) 07/29/2022 0146   CL 92 (L) 07/29/2022 0146   CO2 24 07/29/2022 0146   GLUCOSE 264 (H) 07/29/2022 0146   BUN 10 07/29/2022 0146   BUN 21 06/16/2021 1658   CREATININE 1.21 07/29/2022 0146   CREATININE 1.73 (H) 11/12/2021 1643   CALCIUM 8.1 (L) 07/29/2022 0146   GFRNONAA >60 07/29/2022 0146   GFRAA >60 08/30/2019 0445    INR    Component Value Date/Time   INR 1.7 (H) 07/26/2022 0625     Intake/Output Summary (Last 24 hours) at 07/29/2022 0902 Last data filed at 07/29/2022 0400 Gross per 24 hour  Intake 1532.66 ml  Output 2170 ml  Net -637.34 ml      Assessment/Plan:  62 y.o. male is 1 day post op, s/p: RLE angiogram with right below-knee popliteal artery, tibioperoneal  trunk, and peroneal artery angioplasty   -Patient tolerated angiogram yesterday fine. L groin cath site intact without hematoma -RLE blood flow currently optimized. 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 Appreciate Dr.Duda involvement in further amputation needs for the right foot -Continue ASA, plavix, statin. Eliquis is on hold pending amputation -Continue vancomycin per primary   David Dubonnet, PA-C [facility] [phone] 07/29/2022 9:02 AM   I agree with the above.  Successful revascularization yesterday by Dr. Chestine Spore.  Dr [name] to eval for amp revision.     Durene Cal

## 2022-07-30 LAB — GLUCOSE, CAPILLARY
Glucose-Capillary: 220 mg/dL — ABNORMAL HIGH (ref 70–99)
Glucose-Capillary: 231 mg/dL — ABNORMAL HIGH (ref 70–99)
Glucose-Capillary: 225 mg/dL — ABNORMAL HIGH (ref 70–99)
Glucose-Capillary: 181 mg/dL — ABNORMAL HIGH (ref 70–99)

## 2022-07-30 LAB — BASIC METABOLIC PANEL
Anion gap: 8 (ref 5–15)
BUN: 9 mg/dL (ref 6–20)
CO2: 23 mmol/L (ref 22–32)
Calcium: 8.5 mg/dL — ABNORMAL LOW (ref 8.9–10.3)
Chloride: 96 mmol/L — ABNORMAL LOW (ref 98–111)
Creatinine, Ser: 1.1 mg/dL (ref 0.61–1.24)
GFR, Estimated: >60 mL/min (ref >60)
Glucose, Bld: 269 mg/dL — ABNORMAL HIGH (ref 70–99)
Potassium: 3.6 mmol/L (ref 3.5–5.1)
Sodium: 127 mmol/L — ABNORMAL LOW (ref 135–145)

## 2022-07-30 LAB — CBC
HCT: 28.2 % — ABNORMAL LOW (ref 39.0–52.0)
Hemoglobin: 9.8 g/dL — ABNORMAL LOW (ref 13.0–17.0)
MCH: 30.4 pg (ref 26.0–34.0)
MCHC: 34.8 g/dL (ref 30.0–36.0)
MCV: 87.6 fL (ref 80.0–100.0)
Platelets: 283 10*3/uL (ref 150–400)
RBC: 3.22 MIL/uL — ABNORMAL LOW (ref 4.22–5.81)
RDW: 13.6 % (ref 11.5–15.5)
WBC: 8.7 10*3/uL (ref 4.0–10.5)
nRBC: 0 % (ref 0.0–0.2)

## 2022-07-30 LAB — AEROBIC/ANAEROBIC CULTURE W GRAM STAIN (SURGICAL/DEEP WOUND)

## 2022-07-30 LAB — MAGNESIUM: Magnesium: 1.6 mg/dL — ABNORMAL LOW (ref 1.7–2.4)

## 2022-07-30 MED ORDER — POTASSIUM CHLORIDE CRYS ER 20 MEQ PO TBCR
30.0000 meq | EXTENDED_RELEASE_TABLET | Freq: Once | ORAL | Status: AC
  Administered 2022-07-30: 30 meq via ORAL
  Filled 2022-07-30 (×2): qty 1

## 2022-07-30 MED ORDER — INSULIN GLARGINE-YFGN 100 UNIT/ML ~~LOC~~ SOLN
24.0000 [IU] | Freq: Every day | SUBCUTANEOUS | Status: DC
  Administered 2022-07-31 – 2022-08-03 (×4): 24 [IU] via SUBCUTANEOUS
  Filled 2022-07-30 (×4): qty 0.24

## 2022-07-30 MED ORDER — PREGABALIN 25 MG PO CAPS
50.0000 mg | ORAL_CAPSULE | Freq: Three times a day (TID) | ORAL | Status: DC
  Administered 2022-07-30 – 2022-08-19 (×60): 50 mg via ORAL
  Filled 2022-07-30 (×60): qty 2

## 2022-07-30 MED ORDER — MAGNESIUM SULFATE 2 GM/50ML IV SOLN
2.0000 g | Freq: Once | INTRAVENOUS | Status: AC
  Administered 2022-07-30: 2 g via INTRAVENOUS
  Filled 2022-07-30 (×2): qty 50

## 2022-07-30 MED ORDER — ENOXAPARIN SODIUM 80 MG/0.8ML IJ SOSY
80.0000 mg | PREFILLED_SYRINGE | Freq: Two times a day (BID) | INTRAMUSCULAR | Status: AC
  Administered 2022-07-30 – 2022-07-31 (×4): 80 mg via SUBCUTANEOUS
  Filled 2022-07-30 (×4): qty 0.8

## 2022-07-30 MED ORDER — ENSURE ENLIVE PO LIQD
237.0000 mL | Freq: Two times a day (BID) | ORAL | Status: DC
  Administered 2022-07-30 (×2): 237 mL via ORAL

## 2022-07-30 MED ORDER — GLUCERNA SHAKE PO LIQD
237.0000 mL | Freq: Three times a day (TID) | ORAL | Status: DC
  Administered 2022-07-31 – 2022-08-19 (×46): 237 mL via ORAL
  Filled 2022-07-30: qty 237

## 2022-07-30 NOTE — Hospital Course (Signed)
PMH of chronic combined CHF, chronic A-fib on Eliquis, type II DM, prior amputation of right foot, HTN.  Present to the hospital with complaints of fever.  Found to have MRSA bacteremia as well as multiple abscesses.  Originally admitted at [facility] on 4/9.  Blood cultures came positive for MRSA 4/10 ID was consulted. 4/12 TTE was negative, TEE was done which was negative for vegetation 4/18 nephrology consulted for AKI. 4/22 neurosurgery APP Meyran/ Dr Yetta Barre were consulted. Recommend medical management with IV antibiotics only without any intervention. Neurosurgery was consulted and recommend conservative measures. 4/23 Dr. [name] was consulted for amputation.  Vascular surgery was consulted for concerns for PVD. 4/24 IR placed right-sided chest tube for pleural effusion 4/25 underwent right below-knee popliteal artery, tibioperoneal trunk and peroneal angioplasty with Dr. Chestine Spore. 4/26 IR remove the chest tube.  Follow-up chest x-ray still shows pleural effusions. 4/27 patient currently does not want to perform the procedure with Dr. [name].  Informed Dr. [name] as well as consulted Dr. Linna Caprice. 4/29: Patient agreeable to proceed with amputation surgery with orthopedic surgeon Dr. Odis Hollingshead 4/30: Patient underwent right fifth ray revision amputation.

## 2022-07-30 NOTE — Progress Notes (Signed)
[facility] Progress Note Patient: David [name] ZOX:[id number] DOB: March 23, 1961 DOA: 07/12/2022  DOS: the patient was seen and examined on 07/30/2022  Brief hospital course: PMH of chronic combined CHF, chronic A-fib on Eliquis, type II DM, prior amputation of right foot, HTN.  Present to the hospital with complaints of fever.  Found to have MRSA bacteremia as well as multiple abscesses.  Originally admitted at [facility] on 4/9.  Blood cultures came positive for MRSA 4/10 ID was consulted. 4/12 TTE was negative, TEE was done which was negative for vegetation 4/18 nephrology consulted for AKI. 4/22 neurosurgery APP Meyran/ Dr Yetta Barre were consulted. Recommend medical management with IV antibiotics only without any intervention. Neurosurgery was consulted and recommend conservative measures. 4/23 Dr. [name] was consulted for amputation.  Vascular surgery was consulted for concerns for PVD. 4/24 IR placed right-sided chest tube for pleural effusion 4/25 underwent right below-knee popliteal artery, tibioperoneal trunk and peroneal angioplasty with Dr. Chestine Spore. 4/26 IR remove the chest tube.  Follow-up chest x-ray still shows pleural effusions. 4/27 patient currently does not want to perform the procedure with Dr. [name].  Informed Dr. [name] as well as consulted Dr. Linna Caprice. Assessment and Plan: Sepsis secondary to MRSA bacteremia. Meeting SIRS criteria on admission with tachycardia and tachypnea. Also with hyperglycemia and leukocytosis. Blood cultures grew MRSA.  This is complicated by infection spread in multiple body areas. ID was consulted.  Repeat blood cultures negative initially. PICC line was placed on 4/13. Echocardiogram and TEE negative for vegetation. Due to reoccurrence of the fever had another culture drawn on 4/21 which was positive for MRSA again. Repeat culture on 4/24 negative so far for any new growth. PICC line removed on 4/25.  Bilateral multifocal pneumonia seen on  x-ray on 4/17 Right-sided loculated possible parapneumonic effusion. Patient had a chest x-ray at the time of admission which was negative for any acute abnormality. On a follow-up chest x-ray developed left lower lobe abnormalities concerning for pneumonia and after that bilateral concerning infiltrates. Spinal infiltrate showed large right pleural effusion with thick enhancing ring concerning for empyema. Reportedly pulmonary was consulted and recommended IR guided chest tube placement. Chest tube was placed on 4/24.  200 cc of blood-tinged fluid was aspirated. Cultures negative from the area. IR decided to remove the chest tube on 4/26.  Follow-up chest x-ray on 4/26 shows persistent loculated right pleural effusion with atelectasis. Will repeat another chest x-ray on Monday 4/29.  May require formal evaluation by pulmonary and further workup of his effusion.  History of amputation of fifth and fourth toe Acute osteomyelitis of residual fifth metatarsal diaphysis with soft tissue infection with sinus tract Orthopedic consulted. Recommendation is for amputation Patient wants to switch doctors from Dr. [name] but currently agreeable for performing amputation. Patient has seen by podiatry in the past at [facility] Dr. Romualdo Bolk and has gone through multiple excisional surgeries with them. Will follow recommendation from orthopedics.  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 Thoracic and lumbar MRI spine on 4/10 did not show any evidence of active infection. On repeat MRI on 4/22 due to ongoing back pain there were evidence of osteomyelitis discitis as well as abscess. Lumbar spine shows no evidence of discitis osteomyelitis Neurosurgery Dr. Yetta Barre was consulted, recommended no surgical intervention. Will require repeat MRI.  Probably in 1  week~4/29  Duodenitis  seen on  admission CT abdomen. Currently no abdominal symptoms. Monitor.   Pain control. Patient reports ongoing pain control issues. Currently on IV Dilaudid as well as oxycodone. Also scheduled Robaxin.  Lyrica on 4/26, dose increased on 4/27 Monitor.   Peripheral vascular disease with critical limb ischemia. Vascular surgery was consulted by Dr. [name] Underwent angiogram 4/25 with angioplasty of right below-knee popliteal artery, tibioperoneal trunk, and peroneal artery   Recommendation for aspirin Plavix and statin.   Hypervolemic hyponatremia. Sodium level improving. Nephrology was consulted.  Treated with tolvaptan without any response.  Later on responded well to diuresis. Monitor.   AKI on CKD 3A. Baseline 1.1.  On admission 1.4. Currently serum creatinine stable. Monitor.   HTN. Blood pressure stable. Currently continuing current regimen.   Oral thrush. Nystatin. Resolved.   Type of diabetes mellitus uncontrolled with hyperglycemia with long-term insulin use. Hemoglobin A1c 9.1 Currently on 20 units of Semglee and sliding scale insulin. Dose adjusted.  Change Ensure to Glucerna. Monitor.   Chronic A-fib. Currently rate controlled. Eliquis on hold.  Currently switching to Lovenox.     Adult failure to thrive. No evidence of protein-calorie malnutrition so far Continue supplementation.   Acute on chronic combined CHF. Echocardiogram currently 60-65%.  No wall motion abnormality.   Treated with IV diuresis. Currently on 3.125 mg Coreg, digoxin 0.125 mcg, Lasix 40 mg oral daily. Aldactone on hold.  Entresto on hold. Also on aspirin and Plavix. Appears euvolemic.   Anemia of chronic disease In the setting of osteomyelitis. H&H relatively stable.  Monitor.  No active bleeding.    Subjective: Reports ongoing pain issues.  No nausea no vomiting.  Would like to increase the dose of the narcotics.  No fever no chills.  No nausea or  vomiting.  Oral intake adequate.  Physical Exam: General: in Mild distress, No Rash Cardiovascular: S1 and S2 Present, No Murmur Respiratory: Good respiratory effort, Bilateral Air entry present. No Crackles, No wheezes Abdomen: Bowel Sound present, No tenderness Extremities: Trace edema, right fifth and fourth toe amputation Neuro: Alert and oriented x3, no new focal deficit  Data Reviewed: I have Reviewed nursing notes, Vitals, and Lab results. Since last encounter, pertinent lab results CBC and BMP   . I have ordered test including CBC and BMP ESR and CRP  . I have discussed pt's care plan and test results with orthopedic surgery  .   Disposition: Status is: Inpatient Remains inpatient appropriate because: Need IV antibiotics and further workup  Family Communication: No one at bedside Level of care: Telemetry Surgical continue for now, switch to MedSurg tomorrow. Vitals:   07/29/22 2026 07/29/22 2358 07/30/22 0850 07/30/22 1130  BP:  (!) 144/79 137/81 116/79  Pulse:  86 99 74  Resp:  20 (!) 21 18  Temp: 97.8 F (36.6 C) 99.2 F (37.3 C) 98.1 F (36.7 C) 98.2 F (36.8 C)  TempSrc: Oral Oral Oral Oral  SpO2:  100% 96% 97%  Weight:      Height:         Author: Lynden Oxford, MD 07/30/2022 4:45 PM  Please look on [email or web address] to find out who is on call.

## 2022-07-30 NOTE — Progress Notes (Signed)
Patient with complex history involving diabetic right foot wound. S/p RLE arteriogram and endovascular reconstruction by Dr. Chestine Spore on 4/25. Dr. [name] has previously done 4th partial ray resection. Patient has also had partial 5th ray resection at OSH, and now has chronic wound w/ osteo. Dr. [name] has seen the patient during this hospital stay and recommended 5th ray amputation. Patient requests 2nd opinion. He is medically stable. I will reach out to our foot and ankle team to see if this request can be accommodated as an inpatient.

## 2022-07-31 ENCOUNTER — Inpatient Hospital Stay (HOSPITAL_COMMUNITY): Payer: Medicaid Other

## 2022-07-31 LAB — OSMOLALITY: Osmolality: 276 mOsm/kg (ref 275–295)

## 2022-07-31 LAB — BASIC METABOLIC PANEL
Anion gap: 7 (ref 5–15)
Anion gap: 10 (ref 5–15)
BUN: 10 mg/dL (ref 6–20)
BUN: 10 mg/dL (ref 6–20)
CO2: 23 mmol/L (ref 22–32)
CO2: 23 mmol/L (ref 22–32)
Calcium: 8.4 mg/dL — ABNORMAL LOW (ref 8.9–10.3)
Calcium: 8.6 mg/dL — ABNORMAL LOW (ref 8.9–10.3)
Chloride: 96 mmol/L — ABNORMAL LOW (ref 98–111)
Chloride: 96 mmol/L — ABNORMAL LOW (ref 98–111)
Creatinine, Ser: 1.11 mg/dL (ref 0.61–1.24)
Creatinine, Ser: 1.12 mg/dL (ref 0.61–1.24)
GFR, Estimated: >60 mL/min (ref >60)
GFR, Estimated: >60 mL/min (ref >60)
Glucose, Bld: 204 mg/dL — ABNORMAL HIGH (ref 70–99)
Glucose, Bld: 218 mg/dL — ABNORMAL HIGH (ref 70–99)
Potassium: 3.5 mmol/L (ref 3.5–5.1)
Potassium: 3.6 mmol/L (ref 3.5–5.1)
Sodium: 126 mmol/L — ABNORMAL LOW (ref 135–145)
Sodium: 129 mmol/L — ABNORMAL LOW (ref 135–145)

## 2022-07-31 LAB — GLUCOSE, CAPILLARY
Glucose-Capillary: 232 mg/dL — ABNORMAL HIGH (ref 70–99)
Glucose-Capillary: 218 mg/dL — ABNORMAL HIGH (ref 70–99)
Glucose-Capillary: 255 mg/dL — ABNORMAL HIGH (ref 70–99)
Glucose-Capillary: 142 mg/dL — ABNORMAL HIGH (ref 70–99)

## 2022-07-31 LAB — CBC
HCT: 27.2 % — ABNORMAL LOW (ref 39.0–52.0)
Hemoglobin: 9.2 g/dL — ABNORMAL LOW (ref 13.0–17.0)
MCH: 30.4 pg (ref 26.0–34.0)
MCHC: 33.8 g/dL (ref 30.0–36.0)
MCV: 89.8 fL (ref 80.0–100.0)
Platelets: 316 10*3/uL (ref 150–400)
RBC: 3.03 MIL/uL — ABNORMAL LOW (ref 4.22–5.81)
RDW: 13.9 % (ref 11.5–15.5)
WBC: 9.7 10*3/uL (ref 4.0–10.5)
nRBC: 0 % (ref 0.0–0.2)

## 2022-07-31 LAB — C-REACTIVE PROTEIN: CRP: 7.9 mg/dL — ABNORMAL HIGH (ref <1.0)

## 2022-07-31 LAB — SEDIMENTATION RATE: Sed Rate: 112 mm/hr — ABNORMAL HIGH (ref 0–16)

## 2022-07-31 LAB — CULTURE, BLOOD (ROUTINE X 2): Culture: NO GROWTH

## 2022-07-31 LAB — MAGNESIUM: Magnesium: 1.7 mg/dL (ref 1.7–2.4)

## 2022-07-31 LAB — PROCALCITONIN: Procalcitonin: <0.1 ng/mL

## 2022-07-31 MED ORDER — POTASSIUM CHLORIDE CRYS ER 20 MEQ PO TBCR: 20.0000 meq | EXTENDED_RELEASE_TABLET | Freq: Two times a day (BID) | ORAL | Status: DC

## 2022-07-31 MED ORDER — MAGNESIUM OXIDE -MG SUPPLEMENT 400 (240 MG) MG PO TABS
400.0000 mg | ORAL_TABLET | Freq: Every day | ORAL | Status: DC
  Administered 2022-07-31 – 2022-08-03 (×4): 400 mg via ORAL
  Filled 2022-07-31 (×4): qty 1

## 2022-07-31 MED ORDER — POTASSIUM CHLORIDE CRYS ER 20 MEQ PO TBCR
20.0000 meq | EXTENDED_RELEASE_TABLET | Freq: Two times a day (BID) | ORAL | Status: AC
  Administered 2022-07-31 (×2): 20 meq via ORAL
  Filled 2022-07-31 (×2): qty 1

## 2022-07-31 MED ORDER — HYDROXYZINE HCL 25 MG PO TABS
25.0000 mg | ORAL_TABLET | Freq: Three times a day (TID) | ORAL | Status: DC | PRN
  Administered 2022-07-31 – 2022-08-19 (×24): 25 mg via ORAL
  Filled 2022-07-31 (×24): qty 1

## 2022-07-31 MED ORDER — SPIRONOLACTONE 25 MG PO TABS
25.0000 mg | ORAL_TABLET | Freq: Every day | ORAL | Status: DC
  Administered 2022-08-01 – 2022-08-03 (×3): 25 mg via ORAL
  Filled 2022-07-31 (×3): qty 1

## 2022-07-31 MED ORDER — HYDROXYZINE HCL 25 MG PO TABS: 25.0000 mg | ORAL_TABLET | Freq: Three times a day (TID) | ORAL | Status: DC

## 2022-07-31 MED ORDER — SPIRONOLACTONE 12.5 MG HALF TABLET
12.5000 mg | ORAL_TABLET | Freq: Every day | ORAL | Status: DC
  Administered 2022-07-31: 12.5 mg via ORAL
  Filled 2022-07-31: qty 1

## 2022-07-31 MED ORDER — BUSPIRONE HCL 5 MG PO TABS
15.0000 mg | ORAL_TABLET | Freq: Three times a day (TID) | ORAL | Status: DC
  Administered 2022-07-31 – 2022-08-19 (×58): 15 mg via ORAL
  Filled 2022-07-31 (×58): qty 3

## 2022-07-31 NOTE — Consult Note (Signed)
Patient ID: David [name] MRN: [id number] DOB/AGE: 61-Feb-1962 61 y.o.  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  PMH notable for chronic combined CHF, chronic A-fib on Eliquis, type II DM, prior amputation of right foot, HTN.  Reports he was doing well until this frecent admission. Blood cx MRSA +.  Denies numbness/tingling.  Past Medical History: Past Medical History:  Diagnosis Date   Cardiomyopathy (HCC)    a. EF 45% in 2019.   CHF (congestive heart failure) (HCC)    Chronic anticoagulation 09/30/2017   Diabetes mellitus type 2 in nonobese [facility])    Does not have health insurance    Essential hypertension 09/26/2017   Financial difficulties    H/O noncompliance with medical treatment, presenting hazards to health    Non-insulin treated type 2 diabetes mellitus (HCC) 09/26/2017   Persistent atrial fibrillation (HCC)    Sleep apnea suspected 09/30/2017    Surgical History: Past Surgical History:  Procedure Laterality Date   ABDOMINAL AORTOGRAM W/LOWER EXTREMITY N/A 09/02/2021   Procedure: ABDOMINAL AORTOGRAM W/LOWER EXTREMITY;  Surgeon: Nada Libman, MD;  Location: [facility];  Service: Cardiovascular;   Laterality: N/A;   ABDOMINAL AORTOGRAM W/LOWER EXTREMITY N/A 12/28/2021   Procedure: ABDOMINAL AORTOGRAM W/LOWER EXTREMITY;  Surgeon: Nada Libman, MD;  Location: [facility];  Service: Cardiovascular;  Laterality: N/A;   ABDOMINAL AORTOGRAM W/LOWER EXTREMITY N/A 07/28/2022   Procedure: ABDOMINAL AORTOGRAM W/LOWER EXTREMITY;  Surgeon: Cephus Shelling, MD;  Location: [facility];  Service: Cardiovascular;  Laterality: N/A;   AMPUTATION Right 09/04/2021   Procedure: 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;  Surgeon: Nadara Mustard, MD;  Location: [facility] OR;  Service: Orthopedics;  Laterality: Right;   APPENDECTOMY  1971   CARDIOVERSION N/A 09/28/2017   Procedure: CARDIOVERSION;  Surgeon: Thurmon Fair, MD;  Location: [facility];  Service: Cardiovascular;  Laterality: N/A;   PERIPHERAL VASCULAR BALLOON ANGIOPLASTY  09/02/2021   Procedure: PERIPHERAL VASCULAR BALLOON ANGIOPLASTY;  Surgeon: Nada Libman, MD;  Location: [facility];  Service: Cardiovascular;;   PERIPHERAL VASCULAR BALLOON ANGIOPLASTY  12/28/2021   Procedure: PERIPHERAL VASCULAR BALLOON ANGIOPLASTY;  Surgeon: Nada Libman, MD;  Location: [facility];  Service: Cardiovascular;;  Posterior Tibial PTA only   TEE WITHOUT CARDIOVERSION N/A 09/28/2017   Procedure: TRANSESOPHAGEAL ECHOCARDIOGRAM (TEE);  Surgeon: Thurmon Fair, MD;  Location: [facility] ENDOSCOPY;  Service: Cardiovascular;  Laterality: N/A;   TEE WITHOUT CARDIOVERSION N/A 09/03/2021   Procedure: TRANSESOPHAGEAL ECHOCARDIOGRAM (TEE);  Surgeon: Chilton Si, MD;  Location: [facility] ENDOSCOPY;  Service: Cardiovascular;  Laterality: N/A;   TEE WITHOUT CARDIOVERSION N/A 07/15/2022   Procedure: TRANSESOPHAGEAL ECHOCARDIOGRAM;  Surgeon: Thurmon Fair, MD;  Location: [facility];  Service: Cardiovascular;  Laterality: N/A;    Family History: Family History  Problem Relation Age of Onset   Hypertension Mother     Social History: Social History    Socioeconomic History   Marital status: Married    Spouse name: Not on file   Number of children: Not on file   Years of education: Not on file   Highest education level: Not on file  Occupational History   Not on file  Tobacco Use   Smoking status: Former    Years: 15    Types: Cigarettes    Quit date: 03/18/2018    Years since quitting: 4.3   Smokeless tobacco: Never   Tobacco comments:    09/26/2017 "2-3 cigarettes/month now"  Vaping Use   Vaping Use: Never used  Substance and Sexual Activity   Alcohol use: Not Currently    Alcohol/week: 3.0 standard drinks of alcohol    Types: 3 Cans of beer per week    Comment: States he quit drinking 8-9 months ago   Drug use: Never   Sexual activity: Not Currently  Other Topics Concern   Not on file  Social History Narrative   Not on file   Social Determinants of Health   Financial Resource Strain: Not on file  Food Insecurity: No Food Insecurity (07/12/2022)   Hunger Vital Sign    Worried About Running Out of Food in the Last Year: Never true    Ran Out of Food in the Last Year: Never true  Transportation Needs: No Transportation Needs (07/12/2022)   PRAPARE - Administrator, Civil Service (Medical): No    Lack of Transportation (Non-Medical): No  Physical Activity: Not on file  Stress: Stress Concern Present (09/26/2017)   [employer] of Occupational Health - Occupational Stress Questionnaire    Feeling of Stress : Very much  Social Connections: Not on file  Intimate Partner Violence: Not At Risk (07/12/2022)   Humiliation, Afraid, Rape, and Kick questionnaire    Fear of Current or Ex-Partner: No    Emotionally Abused: No    Physically Abused: No    Sexually Abused: No    Allergies: Patient has no known allergies.  Medications: I have reviewed the patient's current medications.  Vital Signs: Patient Vitals for the past 24 hrs:  BP Temp Temp src Pulse Resp SpO2 Weight  07/31/22 1949 -- -- -- 92 16  96 % --  07/31/22 1926 132/77 98.3 F (36.8 C) Oral 91 18 96 % --  07/31/22 1223 124/88 98.1 F (36.7 C) Oral 99 -- 95 % --  07/31/22 1040 -- -- -- 82 -- -- --  07/31/22 0835 (!) 139/95 98.2 F (36.8 C) Oral 75 -- 97 % --  07/31/22 0500 -- -- -- 74 -- 97 % 81.1 kg  07/31/22 0327 (!) 126/90 97.8 F (36.6 C) Oral 74 -- 95 % --  07/30/22 2333 133/88 98.5 F (36.9 C) Oral 91 -- 97 % --    Radiology: DG Chest 2 View  Result Date: 07/31/2022 CLINICAL DATA:  BILATERAL pleural effusions EXAM: CHEST - 2 VIEW COMPARISON:  07/29/2022 FINDINGS: Enlargement of cardiac silhouette with pulmonary vascular congestion. RIGHT pleural effusion with infiltrates of the mid to lower RIGHT lung, could represent pneumonia or asymmetric edema. Minimal atelectasis or infiltrate LEFT base with small calcified granulomata. No pneumothorax. Bones demineralized. IMPRESSION: RIGHT pleural effusion with asymmetric RIGHT lung infiltrate question asymmetric pulmonary edema versus pneumonia. Minimal atelectasis versus infiltrate LEFT base.  Electronically Signed   By: Ulyses Southward M.D.   On: 07/31/2022 15:30   DG Chest Port 1 View  Result Date: 07/29/2022 CLINICAL DATA:  Chest tube removal EXAM: PORTABLE CHEST 1 VIEW COMPARISON:  07/29/2022 at 9:17 a.m. FINDINGS: The right pleural drainage catheters been removed. Stable blunting of the right lateral costophrenic angle and stable right pleural thickening attributed to loculated pleural effusions. Associated passive atelectasis and hazy density in the right mid and lower lung. Increased obscuration of left hemidiaphragm suggesting atelectasis in the left lower lobe. A small left pleural effusion could be contributory. IMPRESSION: 1. Interval removal of the right pleural drainage catheter, without pneumothorax. 2. Stable loculated right pleural effusion with passive atelectasis and hazy density in the right mid and lower lung. 3. Increased obscuration of the left hemidiaphragm  suggesting atelectasis in the left lower lobe. Small left pleural effusion could be contributory. Electronically Signed   By: Gaylyn Rong M.D.   On: 07/29/2022 18:11   DG CHEST PORT 1 VIEW  Result Date: 07/29/2022 CLINICAL DATA:  Chest tube in place. EXAM: PORTABLE CHEST 1 VIEW COMPARISON:  Radiographs 07/24/2022 and 07/20/2022.  CT 07/12/2022. FINDINGS: 0918 hours. Interval placement of a small caliber pigtail catheter laterally in the right pleural space. The right pleural effusion has decreased in volume. The right arm PICC has been removed in the interval. There is mildly improved aeration of the right lung base. No evidence of pneumothorax. The left lung is clear. The heart size and mediastinal contours are stable with cardiomegaly and aortic atherosclerosis. IMPRESSION: 1. Decreased right pleural effusion following chest tube placement. No evidence of pneumothorax. 2. Mildly improved aeration of the right lung base. Electronically Signed   By: Carey Bullocks M.D.   On: 07/29/2022 11:37   PERIPHERAL VASCULAR CATHETERIZATION  Result Date: 07/28/2022 Images from the original result were not included.   Patient name: David [name]     MRN: [id number]        DOB: 05-06-1960            Sex: male  07/28/2022 Pre-operative Diagnosis: Critical limb ischemia of the right lower extremity with osteomyelitis of right fifth metatarsal Post-operative diagnosis:  Same Surgeon:  Cephus Shelling, MD Procedure Performed: 1.  Ultrasound-guided access left common femoral artery 2.  Aortogram with catheter selection of aorta 3.  Right lower extremity arteriogram with selection of third order branches 4.  Catheter selection of right SFA 5.  Right below-knee popliteal artery, tibioperoneal trunk, and peroneal angioplasty (3 mm x 100 mm Sterling) 6.  Mynx closure of the left common femoral artery 7.  49 minutes of monitored moderate conscious sedation time  Indications: Patient is a 62 year old male that has previously  undergone tibial intervention with Dr. Myra Gianotti.  He now has developed osteomyelitis of his right fifth metatarsal.  Presents for lower extremity arteriogram with possible intervention with a focus on the right leg after risks and benefits discussed.  Findings:  Aortogram showed no flow-limiting stenosis in the aortoiliac segment.  The left renal and right renal artery/accessory right renal were all patent.  The visualized portion of the SMA was patent.  Right lower extremity arteriogram showed a patent common femoral and profunda.  The SFA has some disease but no flow-limiting stenosis.  Above-knee popliteal artery is widely patent.  The below-knee popliteal artery had a focal 74% stenosis by measurement.  The TP trunk had a 95% stenosis into the proximal peroneal by measurement.  There was two-vessel runoff  in the peroneal and posterior tibial artery.  Ultimately the below-knee popliteal artery, tibioperoneal trunk and proximal peroneal artery were treated with a 3 mm Sterling for 2 minutes.  Excellent results.  Widely patent vessel.  No significant residual stenosis and preserved two-vessel runoff.             Procedure:  The patient was identified in the holding area and taken to room 8.  The patient was then placed supine on the table and prepped and draped in the usual sterile fashion.  A time out was called.  Patient received Versed and fentanyl for conscious moderate sedation.  Vital signs were monitored including heart rate, respiratory rate, oxygenation and blood pressure.  I was present for all of moderate sedation.  Ultrasound was used to evaluate the left common femoral artery.  It was patent .  A digital ultrasound image was acquired.  A micropuncture needle was used to access the left common femoral artery under ultrasound guidance.  An 018 wire was advanced without resistance and a micropuncture sheath was placed.  The 018 wire was removed and a benson wire was placed.  The micropuncture sheath was  exchanged for a 5 french sheath.  An omniflush catheter was advanced over the wire to the level of L-1.  An abdominal angiogram was obtained.  Next, using the omniflush catheter and a benson wire, the aortic bifurcation was crossed and the catheter was placed into theright external iliac artery and right runoff was obtained.  Ultimately after evaluating images elected for intervention.  I used a Bentson wire down the right SFA and exchanged for a long 5 [place] Catapult sheath over the aortic bifurcation.  Patient was given 100 units/kg IV heparin.  I performed hand injections from a sheath in the distal SFA on the right to locate the lesions.  I then used a V18 wire and I was able to get down through the below-knee popliteal stenosis through the TP trunk stenosis into the peroneal artery.  I then elected to treat this with a long 3 mm Sterling to nominal pressure for 2 minutes.  Hand-injection after treatment showed widely patent below-knee popliteal tibioperoneal trunk and two-vessel runoff.  There was no significant residual stenosis.  Wires and catheters were removed.  Short 5 French sheath in the left groin.  Mynx closure device deployed.  Plan: Good results after angioplasty today.  He has two-vessel runoff in the peroneal and posterior tibial.  Aspirin Plavix statin.  Cephus Shelling, MD [facility] Office: [phone]   CT [facility] PLEURAL DRAIN W/INDWELL CATH W/IMG GUIDE  Result Date: 07/28/2022 INDICATION: Loculated right-sided pleural effusion. Please perform image guided right-sided chest tube placement for infection source control purposes. EXAM: ULTRASOUND AND CT-GUIDED RIGHT-SIDED CHEST TUBE PLACEMENT COMPARISON:  Thoracic spine MRI-07/05/2022 Chest CT-07/12/2022 MEDICATIONS: The patient is currently admitted to the hospital and receiving intravenous antibiotics. The antibiotics were administered within an appropriate time frame prior to the initiation of the  procedure. ANESTHESIA/SEDATION: Moderate (conscious) sedation was employed during this procedure. A total of 100 mcg Fentanyl was administered intravenously. Moderate Sedation Time: 25 minutes. The patient's level of consciousness and vital signs were monitored continuously by radiology nursing throughout the procedure under my direct supervision. CONTRAST:  None COMPLICATIONS: None immediate. PROCEDURE: RADIATION DOSE REDUCTION: This exam was performed according to the departmental dose-optimization program which includes automated exposure control, adjustment of the mA and/or kV according to patient size and/or use of iterative reconstruction technique.  Informed written consent was obtained from the patient after a discussion of the risks, benefits and alternatives to treatment. The patient was placed supine on the CT gantry and a pre procedural CT was performed re-demonstrating the known partially loculated small to moderate-sized right-sided pleural effusion. CT gantry table position was marked and the complex fluid collection was identified sonographically. The procedure was planned. A timeout was performed prior to the initiation of the procedure. The inferolateral aspect of the right chest was prepped and draped in the usual sterile fashion. The overlying soft tissues were anesthetized with 1% lidocaine with epinephrine. Under direct ultrasound guidance, an 18 gauge trocar needle was advanced into the complex right-sided pleural effusion and a short Amplatz super stiff wire was coiled within the collection. Appropriate positioning was confirmed with a limited CT scan (series 3). The tract was serially dilated allowing placement of a 12 [place] all-purpose drainage catheter. Appropriate positioning was confirmed with a limited postprocedural CT scan (series 4). Next, approximately 200 cc blood-tinged fluid was aspirated. The tube was connected to a pleura vac device and sutured in place. A dressing was applied.  The patient tolerated the procedure well without immediate post procedural complication. IMPRESSION: Successful CT guided placement of a 34 French all purpose drain catheter into the complex right-sided pleural effusion with aspiration of 12 mL of blood-tinged fluid. Samples were sent to the laboratory as requested by the ordering clinical team. Electronically Signed   By: Simonne Come M.D.   On: 07/28/2022 15:11   VAS [place] ABI WITH/WO TBI  Result Date: 07/26/2022  LOWER EXTREMITY DOPPLER STUDY Patient Name:  David [name]  Date of Exam:   07/26/2022 Medical Rec #: [id number]    Accession #:    [id number] Date of Birth: 1960/09/24     Patient Gender: M Patient Age:   86 years Exam Location:  [facility] Procedure:      VAS [place] ABI WITH/WO TBI Referring Phys: MARCUS DUDA --------------------------------------------------------------------------------  Indications: Gangrene, and peripheral artery disease. High Risk Factors: Hypertension, Diabetes.  Vascular Interventions: 03/20/2022 - Right RAY RESECTION FOOT. Limitations: Today's exam was limited due to bandages , involuntary patient              movement, an open wound and Right restricted arm. Comparison Study: 01/05/2022 - Right: Resting right ankle-brachial index                   indicates mild right lower                   extremity arterial disease. The right toe-brachial index is                   abnormal.                    Left: Resting left ankle-brachial index indicates moderate                   left lower                   extremity arterial disease. The left toe-brachial index is                   abnormal. Performing Technologist: Olen Cordial RVT  Examination Guidelines: A complete evaluation includes at minimum, Doppler waveform signals and systolic blood pressure reading at the level of bilateral brachial, anterior tibial, and posterior tibial arteries, when vessel segments are accessible. Bilateral testing  is considered an integral part of a  complete examination. Photoelectric Plethysmograph (PPG) waveforms and toe systolic pressure readings are included as required and additional duplex testing as needed. Limited examinations for reoccurring indications may be performed as noted.  ABI Findings: +---------+------------------+-----+----------+--------------+ Right    Rt Pressure (mmHg)IndexWaveform  Comment        +---------+------------------+-----+----------+--------------+ Brachial                                  Restricted arm +---------+------------------+-----+----------+--------------+ PTA      113               0.90 monophasic               +---------+------------------+-----+----------+--------------+ DP       113               0.90 monophasic               +---------+------------------+-----+----------+--------------+ Great Toe                       Absent                   +---------+------------------+-----+----------+--------------+ +---------+------------------+-----+-----------+-------+ Left     Lt Pressure (mmHg)IndexWaveform   Comment +---------+------------------+-----+-----------+-------+ Brachial 125                    triphasic          +---------+------------------+-----+-----------+-------+ PTA      122               0.98 multiphasic        +---------+------------------+-----+-----------+-------+ DP       114               0.91 multiphasic        +---------+------------------+-----+-----------+-------+ Great Toe                       Absent             +---------+------------------+-----+-----------+-------+  Summary: Right: Resting right ankle-brachial index indicates mild right lower extremity arterial disease. Unable to obtain TBI due to inconsistent/absent waveforms. Left: Resting left ankle-brachial index is within normal range. Unable to obtain TBI due to inconsistent/absent waveforms. *See table(s) above for measurements and observations.  Electronically signed by  Coral Else MD on 07/26/2022 at 7:27:39 PM.    Final    MR THORACIC SPINE W WO CONTRAST  Result Date: 07/25/2022 CLINICAL DATA:  Follow-up MR for MRSA bacteremia and concern for seeding EXAM: MRI THORACIC AND LUMBAR SPINE WITHOUT AND WITH CONTRAST TECHNIQUE: Multiplanar and multiecho pulse sequences of the thoracic and lumbar spine were obtained without and with intravenous contrast. CONTRAST:  8mL GADAVIST GADOBUTROL 1 MMOL/ML IV SOLN COMPARISON:  07/13/2022 MRI thoracic and lumbar spine FINDINGS: MRI THORACIC SPINE FINDINGS Alignment: Mild scoliosis and exaggeration of the normal thoracic kyphosis. No significant listhesis. Vertebrae: Decreased T1 signal, increased T2 signal, and contrast enhancement throughout the T8 and T11 vertebral bodies, with increased fluid signal and enhancement in the T8-T9 disc space (series 21, image 12 and series 25, image 12), which is new from the prior exam. No acute fracture or suspicious osseous lesion. Cord: T1 hypointense, contrast enhancing material along the anterior aspect of the thecal sac posterior to T8 and T9 (series 23 and 26, images 24-27), most likely phlegmon. There is likely phlegmon along  the right aspect of the thecal sac posterior to T9 (series 25, image 11), although this area is not covered well on the axial sequences. This does not cause significant mass effect on the thecal sac. Prominent epidural fat also does not cause significant mass effect on the thecal sac. No abnormal spinal cord enhancement. Paraspinal and other soft tissues: Anterior to the T7-T9 vertebral bodies, there is a loculated, peripherally enhancing fluid collection, which measures up to 3.1 x 5.9 x 5.9 cm (AP x TR x CC) (series 26, image 23 and 24; series 25, image 5), concerning for multiloculated prevertebral abscess. In addition, there is a large right pleural effusion with a thick enhancing rim, concerning for empyema (series 26, image 23). Disc levels: Mild degenerative changes,  without significant spinal canal stenosis or neural foraminal narrowing. MRI LUMBAR SPINE FINDINGS Segmentation:  5 lumbar type vertebral bodies. Alignment:  Mild levoscoliosis.  No significant listhesis. Vertebrae: No acute fracture, evidence of discitis, suspicious osseous lesion, or abnormal osseous enhancement. Conus medullaris: Extends to the L1 level and appears normal. No evidence of epidural collection. No abnormal enhancement. Paraspinal and other soft tissues: No acute finding. Redemonstrated increased T2 signal in the paraspinous musculature (series 2, images 3 and 9), without abnormal in his is no focal fluid collection in the paraspinous muscles. Disc levels: Degenerative changes are stable from the 07/13/2022 exam. Please see that report for detailed findings. IMPRESSION: 1. Findings concerning for discitis osteomyelitis at T8-T9, with a loculated, peripherally enhancing fluid collection anterior to the T7-T9 vertebral bodies, concerning for a multiloculated prevertebral abscess. 2. Enhancing material along the anterior aspect of the thecal sac posterior to T8 and T9, and likely along the right aspect of the thecal sac at T9, concerning for epidural phlegmon, which does not cause significant mass effect on the thecal sac. 3. Large right pleural effusion with a thick enhancing rim, concerning for empyema. 4. No evidence of discitis or osteomyelitis in the lumbar spine. These results will be called to the ordering clinician or representative by the Radiologist Assistant, and communication documented in the PACS or [employer]. Electronically Signed   By: Wiliam Ke M.D.   On: 07/25/2022 16:55   MR Lumbar Spine W Wo Contrast  Result Date: 07/25/2022 CLINICAL DATA:  Follow-up MR for MRSA bacteremia and concern for seeding EXAM: MRI THORACIC AND LUMBAR SPINE WITHOUT AND WITH CONTRAST TECHNIQUE: Multiplanar and multiecho pulse sequences of the thoracic and lumbar spine were obtained without and  with intravenous contrast. CONTRAST:  8mL GADAVIST GADOBUTROL 1 MMOL/ML IV SOLN COMPARISON:  07/13/2022 MRI thoracic and lumbar spine FINDINGS: MRI THORACIC SPINE FINDINGS Alignment: Mild scoliosis and exaggeration of the normal thoracic kyphosis. No significant listhesis. Vertebrae: Decreased T1 signal, increased T2 signal, and contrast enhancement throughout the T8 and T11 vertebral bodies, with increased fluid signal and enhancement in the T8-T9 disc space (series 21, image 12 and series 25, image 12), which is new from the prior exam. No acute fracture or suspicious osseous lesion. Cord: T1 hypointense, contrast enhancing material along the anterior aspect of the thecal sac posterior to T8 and T9 (series 23 and 26, images 24-27), most likely phlegmon. There is likely phlegmon along the right aspect of the thecal sac posterior to T9 (series 25, image 11), although this area is not covered well on the axial sequences. This does not cause significant mass effect on the thecal sac. Prominent epidural fat also does not cause significant mass effect on the thecal sac. No  abnormal spinal cord enhancement. Paraspinal and other soft tissues: Anterior to the T7-T9 vertebral bodies, there is a loculated, peripherally enhancing fluid collection, which measures up to 3.1 x 5.9 x 5.9 cm (AP x TR x CC) (series 26, image 23 and 24; series 25, image 5), concerning for multiloculated prevertebral abscess. In addition, there is a large right pleural effusion with a thick enhancing rim, concerning for empyema (series 26, image 23). Disc levels: Mild degenerative changes, without significant spinal canal stenosis or neural foraminal narrowing. MRI LUMBAR SPINE FINDINGS Segmentation:  5 lumbar type vertebral bodies. Alignment:  Mild levoscoliosis.  No significant listhesis. Vertebrae: No acute fracture, evidence of discitis, suspicious osseous lesion, or abnormal osseous enhancement. Conus medullaris: Extends to the L1 level and  appears normal. No evidence of epidural collection. No abnormal enhancement. Paraspinal and other soft tissues: No acute finding. Redemonstrated increased T2 signal in the paraspinous musculature (series 2, images 3 and 9), without abnormal in his is no focal fluid collection in the paraspinous muscles. Disc levels: Degenerative changes are stable from the 07/13/2022 exam. Please see that report for detailed findings. IMPRESSION: 1. Findings concerning for discitis osteomyelitis at T8-T9, with a loculated, peripherally enhancing fluid collection anterior to the T7-T9 vertebral bodies, concerning for a multiloculated prevertebral abscess. 2. Enhancing material along the anterior aspect of the thecal sac posterior to T8 and T9, and likely along the right aspect of the thecal sac at T9, concerning for epidural phlegmon, which does not cause significant mass effect on the thecal sac. 3. Large right pleural effusion with a thick enhancing rim, concerning for empyema. 4. No evidence of discitis or osteomyelitis in the lumbar spine. These results will be called to the ordering clinician or representative by the Radiologist Assistant, and communication documented in the PACS or [employer]. Electronically Signed   By: Wiliam Ke M.D.   On: 07/25/2022 16:55   MR FOOT RIGHT W WO CONTRAST  Result Date: 07/24/2022 CLINICAL DATA:  Diabetic foot wound EXAM: MRI OF THE RIGHT FOREFOOT WITHOUT AND WITH CONTRAST TECHNIQUE: Multiplanar, multisequence MR imaging of the right forefoot was performed before and after the administration of intravenous contrast. CONTRAST:  8mL GADAVIST GADOBUTROL 1 MMOL/ML IV SOLN COMPARISON:  X-ray 07/13/2022 FINDINGS: Bones/Joint/Cartilage Postsurgical changes from fifth ray resection at the level of the distal fifth metatarsal diaphysis. There is intense bone marrow edema and enhancement of the residual fifth metatarsal diaphysis extending to the level of the proximal metaphysis (series 6,  image 15). Confluent low T1 signal changes near the resection margin are compatible with acute osteomyelitis. Prior fourth ray resection at the level of the distal fourth metatarsal diaphysis without evidence of osteomyelitis. Remaining osseous structures are intact. No additional sites of osteomyelitis. No fracture or dislocation. Ligaments Intact Lisfranc ligament. Remaining collateral ligaments are intact. Muscles and Tendons Amputation changes to the musculotendinous structures at the lateral forefoot. Diffuse edema-like intramuscular signal which may be related to denervation or myositis. No tenosynovitis. Soft tissues Soft tissue wound or ulceration at the lateral aspect of the forefoot near the residual fifth metatarsal with enhancing sinus tract extending to the underlying resection margin (series 9, images 10-13). Subcutaneous edema of the dorsal forefoot. No organized or drainable fluid collections. IMPRESSION: 1. Postsurgical changes from fifth ray resection to the level of the distal fifth metatarsal diaphysis. 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 2. Soft tissue wound or ulceration at the lateral aspect of the  forefoot near the residual fifth metatarsal with enhancing sinus tract extending to the underlying resection margin. 3. Prior fourth ray resection at the level of the distal fourth metatarsal diaphysis without evidence of osteomyelitis. 4. Diffuse edema-like intramuscular signal which may be related to denervation or myositis. Electronically Signed   By: Duanne Guess D.O.   On: 07/24/2022 15:58   DG Chest 2 View  Result Date: 07/24/2022 CLINICAL DATA:  Follow-up. Worsening dyspnea. Mild productive cough EXAM: CHEST - 2 VIEW COMPARISON:  07/20/2022 x-ray FINDINGS: Stable right-sided PICC. Persistent moderate right effusion and adjacent opacities. Increasing opacity left lung base. No pneumothorax. Stable  cardiopericardial silhouette. Increasing vascular congestion. Tortuous aorta. Dilated stomach with air-fluid level seen beneath the left hemidiaphragm. Please correlate with any symptoms IMPRESSION: Increasing pleural effusion on the right and bilateral lung base opacities. Increasing vascular congestion Dilated stomach with an air-fluid level beneath the left hemidiaphragm. Please correlate with any symptoms and additional workup as clinically appropriate Electronically Signed   By: Karen Kays M.D.   On: 07/24/2022 15:36   DG CHEST PORT 1 VIEW  Result Date: 07/20/2022 CLINICAL DATA:  Shortness of breath EXAM: PORTABLE CHEST 1 VIEW COMPARISON:  07/16/2022 FINDINGS: Right PICC is again noted in satisfactory position. Cardiac shadow is enlarged but stable accentuated by the portable technique. Aortic calcifications are noted. Left lung remains clear. Increasing right-sided infiltrate and effusion are noted. No bony abnormality is noted. IMPRESSION: Increasing right basilar infiltrate with associated effusion. Electronically Signed   By: Alcide Clever M.D.   On: 07/20/2022 12:35   DG CHEST PORT 1 VIEW  Result Date: 07/16/2022 CLINICAL DATA:  Status post PICC line placement. EXAM: PORTABLE CHEST 1 VIEW COMPARISON:  Chest x-ray dated 07/12/2022. FINDINGS: PICC line appears well positioned with tip at the level of the lower SVC/RIGHT atrium. Ill-defined opacity at the RIGHT lung base. Ground-glass opacities within the LEFT upper lung. Probable small bilateral pleural effusions. No pneumothorax is seen. IMPRESSION: 1. PICC line appears well positioned with tip at the level of the lower SVC/RIGHT atrium. 2. Ill-defined opacity at the RIGHT lung base, atelectasis versus pneumonia. 3. Ground-glass opacities within the LEFT upper lung, suspicious for multifocal pneumonia. Electronically Signed   By: Bary Richard M.D.   On: 07/16/2022 13:03   [place] EKG SITE RITE  Result Date: 07/16/2022 If Site Rite image not  attached, placement could not be confirmed due to current cardiac rhythm.  ECHO TEE  Result Date: 07/15/2022    TRANSESOPHOGEAL ECHO REPORT   Patient Name:   David [name] Date of Exam: 07/15/2022 Medical Rec #:  [id number]   Height:       72.0 in Accession #:    [id number]  Weight:       178.6 lb Date of Birth:  [birth year]    BSA:          2.030 m Patient Age:    61 years    BP:           126/75 mmHg Patient Gender: M           HR:           87 bpm. Exam Location:  Inpatient Procedure: Transesophageal Echo and Color Doppler Indications:     bacteremia  History:         Patient has prior history of Echocardiogram examinations, most                  recent 07/13/2022. Arrythmias:Atrial Fibrillation; Risk  Factors:Hypertension, Diabetes and Dyslipidemia.  Sonographer:     Delcie Roch RDCS Referring Phys:  Judye Bos DUNN Diagnosing Phys: Thurmon Fair MD PROCEDURE: After discussion of the risks and benefits of a TEE, an informed consent was obtained from the patient. The transesophogeal probe was passed without difficulty through the esophogus of the patient. Imaged were obtained with the patient in a left lateral decubitus position. Sedation performed by different physician. The patient was monitored while under David sedation. Anesthestetic sedation was provided intravenously by Anesthesiology: 76mg  of Propofol. The patient's vital signs; including heart rate, blood pressure, and oxygen saturation; remained stable throughout the procedure. The patient developed no complications during the procedure.  IMPRESSIONS  1. Left ventricular ejection fraction, by estimation, is 60 to 65%. The left ventricle has normal function. The left ventricle has no regional wall motion abnormalities.  2. Right ventricular systolic function is normal. The right ventricular size is normal.  3. Left atrial size was severely dilated. No left atrial/left atrial appendage thrombus was detected.  4. Right atrial size was  severely dilated.  5. The mitral valve is normal in structure. Trivial mitral valve regurgitation. No evidence of mitral stenosis.  6. The aortic valve is normal in structure. Aortic valve regurgitation is not visualized. No aortic stenosis is present.  7. The inferior vena cava is normal in size with greater than 50% respiratory variability, suggesting right atrial pressure of 3 mmHg. Conclusion(s)/Recommendation(s): Normal biventricular function without evidence of hemodynamically significant valvular heart disease. No evidence of vegetation/infective endocarditis on this transesophageael echocardiogram. FINDINGS  Left Ventricle: Left ventricular ejection fraction, by estimation, is 60 to 65%. The left ventricle has normal function. The left ventricle has no regional wall motion abnormalities. The left ventricular internal cavity size was normal in size. There is  no left ventricular hypertrophy. Right Ventricle: The right ventricular size is normal. No increase in right ventricular wall thickness. Right ventricular systolic function is normal. Left Atrium: Left atrial size was severely dilated. No left atrial/left atrial appendage thrombus was detected. Right Atrium: Right atrial size was severely dilated. Pericardium: There is no evidence of pericardial effusion. Mitral Valve: The mitral valve is normal in structure. Trivial mitral valve regurgitation. No evidence of mitral valve stenosis. Tricuspid Valve: The tricuspid valve is normal in structure. Tricuspid valve regurgitation is trivial. No evidence of tricuspid stenosis. Aortic Valve: The aortic valve is normal in structure. Aortic valve regurgitation is not visualized. No aortic stenosis is present. Pulmonic Valve: The pulmonic valve was normal in structure. Pulmonic valve regurgitation is not visualized. No evidence of pulmonic stenosis. Aorta: The aortic root is normal in size and structure. Venous: The inferior vena cava is normal in size with greater  than 50% respiratory variability, suggesting right atrial pressure of 3 mmHg. IAS/Shunts: No atrial level shunt detected by color flow Doppler. Thurmon Fair MD Electronically signed by Thurmon Fair MD Signature Date/Time: 07/15/2022/2:11:54 PM    Final    EP STUDY  Result Date: 07/15/2022 See surgical note for result.  MR Lumbar Spine W Wo Contrast  Result Date: 07/14/2022 CLINICAL DATA:  Initial evaluation for MRSA bacteremia, back pain, concern for infection. EXAM: MRI LUMBAR SPINE WITHOUT AND WITH CONTRAST TECHNIQUE: Multiplanar and multiecho pulse sequences of the lumbar spine were obtained without and with intravenous contrast. CONTRAST:  8mL GADAVIST GADOBUTROL 1 MMOL/ML IV SOLN COMPARISON:  None Available. FINDINGS: Segmentation: Standard. Lowest well-formed disc space labeled the L5-S1 level. Alignment: Mild levoscoliosis. Alignment otherwise normal with preservation of  the normal lumbar lordosis. No significant listhesis. Vertebrae: Mild chronic compression deformity noted at the superior endplate of L4 without retropulsion. Vertebral body height otherwise maintained with no other acute or recent fracture. Bone marrow signal intensity heterogeneous with a few scattered benign hemangiomata. No worrisome osseous lesions. No evidence for osteomyelitis discitis or septic arthritis. Conus medullaris and cauda equina: Conus extends to the T12 level. Conus and cauda equina appear normal. No epidural abscess or other collections. No abnormal enhancement. Paraspinal and other soft tissues: Mild diffuse edema seen throughout the lower posterior paraspinous musculature (series 6, images 14, 6). Finding is nonspecific. No loculated or drainable fluid collections. Paraspinous soft tissues demonstrate no other acute finding. Disc levels: L1-2:  Unremarkable. L2-3: Disc desiccation without significant disc bulge. Mild facet spurring. No canal or foraminal stenosis. L3-4: Disc desiccation with mild diffuse disc  bulge. Superimposed small biforaminal to extraforaminal disc protrusions closely approximate the exiting left L3 nerve roots. Mild bilateral facet hypertrophy. Resultant mild canal with right worse than left lateral recess stenosis. Mild bilateral L3 foraminal narrowing. L4-5: Disc desiccation with mild disc bulge. Mild bilateral facet hypertrophy with associated trace joint effusions. No significant spinal stenosis. Mild bilateral L4 foraminal narrowing. L5-S1: Degenerative intervertebral disc space narrowing with disc desiccation and diffuse disc bulge. Reactive endplate spurring. Superimposed left subarticular disc protrusion with slight inferior migration. Protruding disc contacts the descending left S1 nerve root as it courses of the left lateral recess (series 8, image 32). Resultant mild narrowing of the left lateral recess. Central canal remains patent. Mild left L5 foraminal narrowing. IMPRESSION: 1. Mild diffuse edema involving the lower posterior paraspinous musculature. Finding is nonspecific, and could be related to overall volume status. Changes of acute myositis, which could be either infectious or inflammatory in nature could also be considered. No loculated or drainable fluid collections. 2. No other MRI evidence for acute infection within the lumbar spine. No evidence for osteomyelitis discitis or septic arthritis. 3. Small left subarticular disc protrusion at L5-S1, contacting the descending left S1 nerve root. 4. Degenerative spondylosis at L3-4 and L4-5 with resultant mild bilateral L3 and L4 foraminal stenosis. Electronically Signed   By: Rise Mu M.D.   On: 07/14/2022 04:34   MR THORACIC SPINE W WO CONTRAST  Result Date: 07/14/2022 CLINICAL DATA:  Initial evaluation for MRSA bacteremia, concern for infection. EXAM: MRI THORACIC WITHOUT AND WITH CONTRAST TECHNIQUE: Multiplanar and multiecho pulse sequences of the thoracic spine were obtained without and with intravenous  contrast. CONTRAST:  8mL GADAVIST GADOBUTROL 1 MMOL/ML IV SOLN COMPARISON:  None Available. FINDINGS: Alignment: Mild dextroscoliosis with exaggeration of the normal thoracic kyphosis. No listhesis. Vertebrae: Mild chronic compression deformities noted about the T2 through T6 as well as the T9 vertebral bodies. No significant retropulsion. No acute or recent fracture. Bone marrow signal intensity within normal limits. Atypical hemangioma noted largely replacing the T8 vertebral body. No worrisome osseous lesions. No evidence for osteomyelitis discitis or septic arthritis. Cord: Normal signal and morphology. No epidural abscess or other collection. No abnormal enhancement. Paraspinal and other soft tissues: No paraspinous inflammatory changes or collections. Small layering bilateral pleural effusions noted. Disc levels: T2-3: Degenerative disc bulging flattens and partially effaces the ventral thecal sac. No significant spinal stenosis. Foramina remain patent. T7-8: Tiny central disc protrusion indents the ventral thecal sac. Associated annular fissure. No significant canal or foraminal stenosis. T11-12: Small central disc protrusion with slight superior migration. No significant spinal stenosis. Foramina remain patent. Otherwise, minor for age  spondylosis seen elsewhere within the thoracic spine. No other significant disc bulge or focal disc herniation. No significant spinal stenosis. Foramina remain patent. IMPRESSION: 1. No MRI evidence for acute infection within the thoracic spine. 2. Small layering bilateral pleural effusions. 3. Mild chronic compression deformities about the T2 through T6 as well as the T9 vertebral bodies. 4. Underlying mild for age thoracic spondylosis without significant stenosis or neural impingement. Electronically Signed   By: Rise Mu M.D.   On: 07/14/2022 04:24   DG Foot 2 Views Right  Result Date: 07/13/2022 CLINICAL DATA:  Right foot osteomyelitis EXAM: RIGHT FOOT - 2  VIEW COMPARISON:  Right foot x-ray 03/08/2022 FINDINGS: There has been interval amputation of the fifth phalanx and distal metatarsal. Old changes of distal fourth metatarsal and phalanx amputation are again noted. There is no acute fracture. No focal osseous lesion or cortical erosion. Soft tissues are within normal limits. Plantar calcaneal spur is present. 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 No radiographic evidence of osteomyelitis. Electronically Signed   By: Darliss Cheney M.D.   On: 07/13/2022 17:52   ECHOCARDIOGRAM COMPLETE  Result Date: 07/13/2022    ECHOCARDIOGRAM REPORT   Patient Name:   David [name] Date of Exam: 07/13/2022 Medical Rec #:  [id number]   Height:       72.0 in Accession #:    [id number]  Weight:       178.6 lb Date of Birth:  05-14-1960    BSA:          2.030 m Patient Age:    61 years    BP:           117/81 mmHg Patient Gender: M           HR:           81 bpm. Exam Location:  Inpatient Procedure: 2D Echo, Color Doppler and Cardiac Doppler Indications:    Bacteremia  History:        Patient has prior history of Echocardiogram examinations, most                 recent 09/03/2021. Arrythmias:Atrial Fibrillation; Risk                 Factors:Hypertension, Dyslipidemia and Current Smoker. OSA,                 Sepsis.  Sonographer:    Milbert Coulter Referring Phys: [id number] Daylene Katayama [facility]  Sonographer Comments: OSA. IMPRESSIONS  1. Left ventricular ejection fraction, by estimation, is 60 to 65%. The left ventricle has normal function. The left ventricle has no regional wall motion abnormalities. Left ventricular diastolic function could not be evaluated.  2. Right ventricular systolic function is normal. The right ventricular size is normal.  3. Left atrial size was mildly dilated.  4. Right atrial size was mildly dilated.  5. The mitral valve is normal in structure. Trivial mitral valve regurgitation. No evidence of mitral stenosis.  6. The aortic valve is  normal in structure. Aortic valve regurgitation is not visualized. No aortic stenosis is present.  7. The inferior vena cava is normal in size with greater than 50% respiratory variability, suggesting right atrial pressure of 3 mmHg. FINDINGS  Left Ventricle: Left ventricular ejection fraction, by estimation, is 60 to 65%. The left ventricle has normal function. The left ventricle has no regional wall motion abnormalities. The left ventricular internal cavity size was normal in size. There is  no  left ventricular hypertrophy. Left ventricular diastolic function could not be evaluated due to atrial fibrillation. Left ventricular diastolic function could not be evaluated. Right Ventricle: The right ventricular size is normal. Right ventricular systolic function is normal. Left Atrium: Left atrial size was mildly dilated. Right Atrium: Right atrial size was mildly dilated. Pericardium: There is no evidence of pericardial effusion. Mitral Valve: The mitral valve is normal in structure. Mild mitral annular calcification. Trivial mitral valve regurgitation. No evidence of mitral valve stenosis. Tricuspid Valve: The tricuspid valve is normal in structure. Tricuspid valve regurgitation is trivial. No evidence of tricuspid stenosis. Aortic Valve: The aortic valve is normal in structure. Aortic valve regurgitation is not visualized. No aortic stenosis is present. Pulmonic Valve: The pulmonic valve was normal in structure. Pulmonic valve regurgitation is trivial. No evidence of pulmonic stenosis. Aorta: The aortic root is normal in size and structure. Venous: The inferior vena cava is normal in size with greater than 50% respiratory variability, suggesting right atrial pressure of 3 mmHg. IAS/Shunts: No atrial level shunt detected by color flow Doppler.  LEFT VENTRICLE PLAX 2D LVIDd:         4.00 cm     Diastology LVIDs:         2.60 cm     LV e' medial:    9.57 cm/s LV PW:         1.10 cm     LV E/e' medial:  10.0 LV IVS:         1.00 cm     LV e' lateral:   16.33 cm/s LVOT diam:     1.90 cm     LV E/e' lateral: 5.8 LVOT Area:     2.84 cm  LV Volumes (MOD) LV vol d, MOD A2C: 44.9 ml LV vol d, MOD A4C: 59.5 ml LV vol s, MOD A2C: 12.6 ml LV vol s, MOD A4C: 16.7 ml LV SV MOD A2C:     32.3 ml LV SV MOD A4C:     59.5 ml LV SV MOD BP:      38.0 ml RIGHT VENTRICLE RV Basal diam:  3.60 cm RV Mid diam:    3.60 cm RV S prime:     10.60 cm/s TAPSE (M-mode): 1.8 cm LEFT ATRIUM             Index        RIGHT ATRIUM           Index LA diam:        4.30 cm 2.12 cm/m   RA Area:     18.20 cm LA Vol (A2C):   81.8 ml 40.29 ml/m  RA Volume:   49.10 ml  24.18 ml/m LA Vol (A4C):   68.2 ml 33.59 ml/m LA Biplane Vol: 75.9 ml 37.38 ml/m  MITRAL VALVE MV Area (PHT): 4.06 cm    SHUNTS MV Decel Time: 187 msec    Systemic Diam: 1.90 cm MV E velocity: 95.50 cm/s Olga Millers MD Electronically signed by Olga Millers MD Signature Date/Time: 07/13/2022/9:39:24 AM    Final    CT ABDOMEN PELVIS W CONTRAST  Result Date: 07/12/2022 CLINICAL DATA:  Abdominal pain EXAM: CT ABDOMEN AND PELVIS WITH CONTRAST TECHNIQUE: Multidetector CT imaging of the abdomen and pelvis was performed using the standard protocol following bolus administration of intravenous contrast. RADIATION DOSE REDUCTION: This exam was performed according to the departmental dose-optimization program which includes automated exposure control, adjustment of the mA and/or kV according to patient size  and/or use of iterative reconstruction technique. CONTRAST:  OMNIPAQUE IOHEXOL 350 MG/ML SOLN COMPARISON:  08/29/2021 FINDINGS: Lower chest: Please see separately reported examination of the abdomen and pelvis. Hepatobiliary: No solid liver abnormality is seen. No gallstones, gallbladder wall thickening, or biliary dilatation. Pancreas: Unremarkable. No pancreatic ductal dilatation or surrounding inflammatory changes. Spleen: Normal in size without significant abnormality. Adrenals/Urinary Tract:  Adrenal glands are unremarkable. Small nonobstructive left renal calculi (series 12, image 42). No right-sided calculi, ureteral calculi, or hydronephrosis. Bladder is unremarkable. Stomach/Bowel: Stomach is within normal limits. Circumferential wall thickening and mucosal hyperenhancement of the descending duodenum (series 12, image 32). Appendix is not clearly visualized. Moderate burden of stool and stool balls throughout the colon Vascular/Lymphatic: Aortic atherosclerosis. No enlarged abdominal or pelvic lymph nodes. Reproductive: No mass or other significant abnormality. Other: No abdominal wall hernia or abnormality. No ascites. Musculoskeletal: No acute or significant osseous findings. IMPRESSION: 1. Circumferential wall thickening and mucosal hyperenhancement of the descending duodenum, suggesting nonspecific infectious or inflammatory duodenitis. 2. Nonobstructive left nephrolithiasis. No right-sided calculi, ureteral calculi, or hydronephrosis. Aortic Atherosclerosis (ICD10-I70.0). Electronically Signed   By: Jearld Lesch M.D.   On: 07/12/2022 12:19   CT Angio Chest PE W and/or Wo Contrast  Result Date: 07/12/2022 CLINICAL DATA:  PE suspected, shortness of breath, history of CHF, flu-like symptoms EXAM: CT ANGIOGRAPHY CHEST WITH CONTRAST TECHNIQUE: Multidetector CT imaging of the chest was performed using the standard protocol during bolus administration of intravenous contrast. Multiplanar CT image reconstructions and MIPs were obtained to evaluate the vascular anatomy. RADIATION DOSE REDUCTION: This exam was performed according to the departmental dose-optimization program which includes automated exposure control, adjustment of the mA and/or kV according to patient size and/or use of iterative reconstruction technique. CONTRAST:  OMNIPAQUE IOHEXOL 350 MG/ML SOLN COMPARISON:  08/29/2021 FINDINGS: Cardiovascular: Satisfactory opacification of the pulmonary arteries to the segmental level. No  evidence of pulmonary embolism. Normal heart size. Three-vessel coronary artery calcifications. No pericardial effusion. Scattered aortic atherosclerosis. Mediastinum/Nodes: No enlarged mediastinal, hilar, or axillary lymph nodes. Small hiatal hernia. Thyroid gland, trachea, and esophagus demonstrate no significant findings. Lungs/Pleura: Dependent bibasilar atelectasis or scarring. Small pleural effusions. Upper Abdomen: Please see separately reported examination of the abdomen. Musculoskeletal: No chest wall abnormality. No acute osseous findings. Review of the MIP images confirms the above findings. IMPRESSION: 1. Negative examination for pulmonary embolism. 2. Small pleural effusions. 3. Dependent bibasilar atelectasis or scarring. 4. Coronary artery disease. Aortic Atherosclerosis (ICD10-I70.0). Electronically Signed   By: Jearld Lesch M.D.   On: 07/12/2022 12:11   DG Chest Portable 1 View  Result Date: 07/12/2022 CLINICAL DATA:  Shortness of breath. EXAM: PORTABLE CHEST 1 VIEW COMPARISON:  Chest x-ray April 6, 24. FINDINGS: Low lung volumes. No consolidation. No visible pleural effusions or pneumothorax. Borderline enlargement the cardiac silhouette, likely accentuated by technique. No acute osseous abnormality. IMPRESSION: Low lung volumes without evidence of acute cardiopulmonary disease. Electronically Signed   By: Feliberto Harts M.D.   On: 07/12/2022 09:56   DG Chest 2 View  Result Date: 07/09/2022 CLINICAL DATA:  Cough and fever for 3 days.  Night sweats. EXAM: CHEST - 2 VIEW COMPARISON:  09/05/2021 FINDINGS: The heart size and mediastinal contours are within normal limits. Stable tortuosity of thoracic aorta noted. Both lungs are clear. The visualized skeletal structures are unremarkable. IMPRESSION: No active cardiopulmonary disease. Electronically Signed   By: Danae Orleans M.D.   On: 07/09/2022 17:44    Labs: Recent Labs  07/30/22 0132 07/31/22 0107  WBC 8.7 9.7  RBC 3.22* 3.03*   HCT 28.2* 27.2*  PLT 283 316   Recent Labs    07/31/22 0107 07/31/22 1024  NA 126* 129*  K 3.5 3.6  CL 96* 96*  CO2 23 23  BUN 10 10  CREATININE 1.11 1.12  GLUCOSE 204* 218*  CALCIUM 8.4* 8.6*   No results for input(s): "LABPT", "INR" in the last 72 hours.  Review of Systems: ROS as detailed in HPI  Physical Exam: Body mass index is 24.26 kg/m.  Physical Exam   Gen: AAOx3, NAD Comfortable at rest  Right Lower Extremity: Skin intact except for full thickness ulcer over distal lateral portion of foot TTP over aforementioned ulcer No blistering, minimal erythema ADF/APF/EHL 5/5 Sensation to light touch decreased but intact throughout DP, PT 2+ to palp CR < 2s   Assessment and Plan: Ortho consult for right foot ulcer in setting of PMH chronic combined CHF, chronic A-fib on Eliquis, type II DM, prior amputation of right foot, HTN  -pt seen at his/primary team's request for second opinion -history, exam and imaging reviewed at length with patient -he will require surgery in the form of right foot revision fifth ray amputation vs TMA. The patient would strongly prefer former -IV abx per primary team/ID -NPO and hold VTE ppx after MN 08/01/22 -PT/OT postop  [name], MD Orthopaedic Surgeon [facility] [phone]  The risks and benefits were presented and reviewed. The risks due to recurrent/new/persistent infection, stiffness, nerve/vessel/tendon injury, nonunion/malunion, wound healing issues, allograft usage, development of arthritis, failure of this surgery, possibility of external fixation, possibility of delayed definitive surgery, need for further surgery, thromboembolic events, anesthesia/medical complications, amputation, death among others were discussed. The patient acknowledged the explanation, agreed to proceed with the plan.

## 2022-07-31 NOTE — Progress Notes (Signed)
Mobility Specialist: Progress Note   07/31/22 1242  Mobility  Activity Refused mobility   Pt refused mobility secondary to DOE and pain level. RN present and states pt has been walking to the BR today. Will f/u as able.   Kalany Diekmann Mobility Specialist Please contact via SecureChat or Rehab office at [phone]

## 2022-07-31 NOTE — Progress Notes (Signed)
[facility] Progress Note Patient: David [name] ZOX:[id number] DOB: [birth year] DOA: 07/12/2022  DOS: the patient was seen and examined on 07/31/2022  Brief hospital course: PMH of chronic combined CHF, chronic A-fib on Eliquis, type II DM, prior amputation of right foot, HTN.  Present to the hospital with complaints of fever.  Found to have MRSA bacteremia as well as multiple abscesses.  Originally admitted at [facility] on 4/9.  Blood cultures came positive for MRSA 4/10 ID was consulted. 4/12 TTE was negative, TEE was done which was negative for vegetation 4/18 nephrology consulted for AKI. 4/22 neurosurgery APP Meyran/ Dr Yetta Barre were consulted. Recommend medical management with IV antibiotics only without any intervention. Neurosurgery was consulted and recommend conservative measures. 4/23 Dr. [name] was consulted for amputation.  Vascular surgery was consulted for concerns for PVD. 4/24 IR placed right-sided chest tube for pleural effusion 4/25 underwent right below-knee popliteal artery, tibioperoneal trunk and peroneal angioplasty with Dr. Chestine Spore. 4/26 IR remove the chest tube.  Follow-up chest x-ray still shows pleural effusions. 4/27 patient currently does not want to perform the procedure with Dr. [name].  Informed Dr. [name] as well as consulted Dr. Linna Caprice. Assessment and Plan: Sepsis secondary to MRSA bacteremia. Meeting SIRS criteria on admission with tachycardia and tachypnea. Also with hyperglycemia and leukocytosis. Blood cultures grew MRSA.  This is complicated by infection spread in multiple body areas. ID was consulted.  Repeat blood cultures negative initially. PICC line was placed on 4/13. Echocardiogram and TEE negative for vegetation. Due to reoccurrence of the fever had another culture drawn on 4/21 which was positive for MRSA again. Repeat culture on 4/24 negative so far for any new growth. PICC line removed on 4/25.  Bilateral multifocal pneumonia seen on  x-ray on 4/17 Right-sided loculated possible parapneumonic effusion. Patient had a chest x-ray at the time of admission which was negative for any acute abnormality. On a follow-up chest x-ray developed left lower lobe abnormalities concerning for pneumonia and after that bilateral concerning infiltrates. Spinal infiltrate showed large right pleural effusion with thick enhancing ring concerning for empyema. Reportedly pulmonary was consulted and recommended IR guided chest tube placement. Chest tube was placed on 4/24. 200 cc of blood-tinged fluid was aspirated. Cultures negative from the area. IR decided to remove the chest tube on 4/26.  Follow-up chest x-ray on 4/26 shows persistent loculated right pleural effusion with atelectasis. Repeat two-view chest x-ray shows persistent right pleural effusion unchanged after chest tube removal and significantly better compared to the one before chest tube placement. ESR elevated CRP lower. For now we will monitor but if he continues to have chest pain May require formal evaluation by pulmonary and further workup of his effusion.  History of amputation of fifth and fourth toe Acute osteomyelitis of residual fifth metatarsal diaphysis with soft tissue infection with sinus tract Orthopedic consulted. Recommendation is for amputation Patient wants to switch doctors from Dr. [name] but currently agreeable for performing amputation. Patient has seen by podiatry in the past at [facility] Dr. Romualdo Bolk and has gone through multiple excisional surgeries with them. Will follow recommendation from orthopedics.  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 Thoracic and lumbar MRI spine on 4/10 did not show any evidence of active infection. On repeat MRI on 4/22 due to ongoing back pain there were evidence of osteomyelitis  discitis as well as  abscess. Lumbar spine shows no evidence of discitis osteomyelitis Neurosurgery Dr. Yetta Barre was consulted, recommended no surgical intervention. Will require repeat MRI.  Probably in 1 week~4/29, will await ID recommendation.  Duodenitis  seen on admission CT abdomen. Currently no abdominal symptoms. Monitor.   Pain control. Patient reports ongoing pain control issues. Currently on IV Dilaudid as well as oxycodone. Also scheduled Robaxin.  Lyrica on 4/26, dose increased on 4/27 Monitor.   Peripheral vascular disease with critical limb ischemia. Vascular surgery was consulted by Dr. [name] Underwent angiogram 4/25 with angioplasty of right below-knee popliteal artery, tibioperoneal trunk, and peroneal artery   Recommendation for aspirin Plavix and statin.   Hypervolemic hyponatremia. Sodium level improving. Nephrology was consulted.  Treated with tolvaptan without any response.  Later on responded well to diuresis. Monitor.   AKI on CKD 3A. Baseline 1.1.  On admission 1.4. Currently serum creatinine stable. Monitor.   HTN. Blood pressure stable. Currently continuing current regimen.   Oral thrush. Nystatin. Resolved.   Type of diabetes mellitus uncontrolled with hyperglycemia with long-term insulin use. Hemoglobin A1c 9.1 Currently on 20 units of Semglee and sliding scale insulin. Dose adjusted.  Change Ensure to Glucerna. Monitor.   Chronic A-fib. Currently rate controlled. Eliquis on hold. Currently switching to Lovenox.   Adult failure to thrive. No evidence of protein-calorie malnutrition so far. Continue supplementation.   Acute on chronic combined CHF. Echocardiogram currently 60-65%.  No wall motion abnormality.   Treated with IV diuresis. Currently on 3.125 mg Coreg, digoxin 0.125 mcg, Lasix 40 mg oral daily. Aldactone was on hold.  Resumed on 4/28.  Entresto on hold. Also on aspirin and Plavix. Appears euvolemic.   Anemia of chronic  disease In the setting of osteomyelitis. H&H relatively stable.  Monitor.  No active bleeding.   Anxiety. Patient is on BuSpar 10 mg twice daily I will increase the dose to 15 mg 3 times daily. Already on Lyrica. Continue Atarax as needed.  Subjective: Reports bilateral chest pain.  No nausea vomiting no fever no chills.  Physical Exam: General: Appear in mild distress; Cardiovascular: S1 and S2 Present, no Murmur, Respiratory: good respiratory effort, Bilateral Air entry present, basal Crackles, no wheezes Abdomen: Bowel Sound present, Non tender  Extremities: bilateral Pedal edema Neurology: alert and oriented to time, place, and person  Data Reviewed: I have Reviewed nursing notes, Vitals, and Lab results. Since last encounter, pertinent lab results CBC and BMP   . I have ordered test including CBC and BMP ESR and CRP  . I have discussed pt's care plan and test results with orthopedic surgery  .   Disposition: Status is: Inpatient Remains inpatient appropriate because: Need IV antibiotics and further workup  Family Communication: No one at bedside Level of care: Telemetry Surgical continue for  Vitals:   07/31/22 0500 07/31/22 0835 07/31/22 1040 07/31/22 1223  BP:  (!) 139/95  124/88  Pulse: 74 75 82 99  Resp:      Temp:  98.2 F (36.8 C)  98.1 F (36.7 C)  TempSrc:  Oral  Oral  SpO2: 97% 97%  95%  Weight: 81.1 kg     Height:         Author: Lynden Oxford, MD 07/31/2022 4:55 PM  Please look on [email or web address] to find out who is on call.

## 2022-08-01 LAB — CBC
HCT: 29.6 % — ABNORMAL LOW (ref 39.0–52.0)
Hemoglobin: 10 g/dL — ABNORMAL LOW (ref 13.0–17.0)
MCH: 30.5 pg (ref 26.0–34.0)
MCHC: 33.8 g/dL (ref 30.0–36.0)
MCV: 90.2 fL (ref 80.0–100.0)
Platelets: 336 10*3/uL (ref 150–400)
RBC: 3.28 MIL/uL — ABNORMAL LOW (ref 4.22–5.81)
RDW: 14 % (ref 11.5–15.5)
WBC: 10.3 10*3/uL (ref 4.0–10.5)
nRBC: 0 % (ref 0.0–0.2)

## 2022-08-01 LAB — BASIC METABOLIC PANEL
Anion gap: 8 (ref 5–15)
BUN: 13 mg/dL (ref 6–20)
CO2: 23 mmol/L (ref 22–32)
Calcium: 8.5 mg/dL — ABNORMAL LOW (ref 8.9–10.3)
Chloride: 97 mmol/L — ABNORMAL LOW (ref 98–111)
Creatinine, Ser: 1.2 mg/dL (ref 0.61–1.24)
GFR, Estimated: >60 mL/min (ref >60)
Glucose, Bld: 274 mg/dL — ABNORMAL HIGH (ref 70–99)
Potassium: 3.9 mmol/L (ref 3.5–5.1)
Sodium: 128 mmol/L — ABNORMAL LOW (ref 135–145)

## 2022-08-01 LAB — GLUCOSE, CAPILLARY
Glucose-Capillary: 167 mg/dL — ABNORMAL HIGH (ref 70–99)
Glucose-Capillary: 223 mg/dL — ABNORMAL HIGH (ref 70–99)

## 2022-08-01 LAB — CULTURE, BLOOD (ROUTINE X 2)
Culture: NO GROWTH
Special Requests: ADEQUATE

## 2022-08-01 LAB — AEROBIC/ANAEROBIC CULTURE W GRAM STAIN (SURGICAL/DEEP WOUND)
Culture: NO GROWTH
Gram Stain: NONE SEEN

## 2022-08-01 LAB — MAGNESIUM: Magnesium: 1.6 mg/dL — ABNORMAL LOW (ref 1.7–2.4)

## 2022-08-01 MED ORDER — MAGNESIUM SULFATE 2 GM/50ML IV SOLN
2.0000 g | Freq: Once | INTRAVENOUS | Status: AC
  Administered 2022-08-01: 2 g via INTRAVENOUS
  Filled 2022-08-01: qty 50

## 2022-08-01 MED ORDER — CHLORHEXIDINE GLUCONATE 4 % EX SOLN
60.0000 mL | Freq: Once | CUTANEOUS | Status: AC
  Administered 2022-08-02: 4 via TOPICAL
  Filled 2022-08-01: qty 60

## 2022-08-01 NOTE — Care Plan (Signed)
Orthopaedic Surgery Plan of Care Note  -pt requires right fifth ray revision amp and is willing to proceed -OR blocked for 4/30 at 10:45 AM at [facility] -please keep NPO and hold VTE ppx from MN -preop orders placed  [name], MD Orthopaedic Surgery [facility]

## 2022-08-01 NOTE — Progress Notes (Signed)
[facility] Progress Note Patient: David [name] ONG:[id number] DOB: Mar 02, 1961 DOA: 07/12/2022  DOS: the patient was seen and examined on 08/01/2022  Brief hospital course: PMH of chronic combined CHF, chronic A-fib on Eliquis, type II DM, prior amputation of right foot, HTN.  Present to the hospital with complaints of fever.  Found to have MRSA bacteremia as well as multiple abscesses.  Originally admitted at [facility] on 4/9.  Blood cultures came positive for MRSA 4/10 ID was consulted. 4/12 TTE was negative, TEE was done which was negative for vegetation 4/18 nephrology consulted for AKI. 4/22 neurosurgery APP [facility]/ Dr Yetta Barre were consulted. Recommend medical management with IV antibiotics only without any intervention. Neurosurgery was consulted and recommend conservative measures. 4/23 Dr. [name] was consulted for amputation.  Vascular surgery was consulted for concerns for PVD. 4/24 IR placed right-sided chest tube for pleural effusion 4/25 underwent right below-knee popliteal artery, tibioperoneal trunk and peroneal angioplasty with Dr. Chestine Spore. 4/26 IR remove the chest tube.  Follow-up chest x-ray still shows pleural effusions. 4/27 patient currently does not want to perform the procedure with Dr. [name].  Informed Dr. [name] as well as consulted Dr. Linna Caprice. 4/29: Patient agreeable to proceed with amputation surgery with orthopedic surgeon Dr. Odis Hollingshead Assessment and Plan: Sepsis secondary to MRSA bacteremia. Meeting SIRS criteria on admission with tachycardia and tachypnea. Also with hyperglycemia and leukocytosis. Blood cultures grew MRSA.  This is complicated by infection spread in multiple body areas. ID was consulted.  Repeat blood cultures negative initially. PICC line was placed on 4/13. Echocardiogram and TEE negative for vegetation. Due to reoccurrence of the fever had another culture drawn on 4/21 which was positive for MRSA again. Repeat culture on 4/24 negative  so far for any new growth. PICC line removed on 4/25.  Bilateral multifocal pneumonia seen on x-ray on 4/17 Right-sided loculated possible parapneumonic effusion. Patient had a chest x-ray at the time of admission which was negative for any acute abnormality. On a follow-up chest x-ray developed left lower lobe abnormalities concerning for pneumonia and after that bilateral concerning infiltrates. Spinal infiltrate showed large right pleural effusion with thick enhancing ring concerning for empyema. Reportedly pulmonary was consulted and recommended IR guided chest tube placement. Chest tube was placed on 4/24. 200 cc of blood-tinged fluid was aspirated. Cultures negative from the area. IR decided to remove the chest tube on 4/26.  Follow-up chest x-ray on 4/26 shows persistent loculated right pleural effusion with atelectasis. Repeat two-view chest x-ray shows persistent right pleural effusion unchanged after chest tube removal and significantly better compared to the one before chest tube placement. ESR elevated CRP lower. For now we will monitor but if he continues to have chest pain May require formal evaluation by pulmonary and further workup of his effusion.  History of amputation of fifth and fourth toe Acute osteomyelitis of residual fifth metatarsal diaphysis with soft tissue infection with sinus tract Orthopedic consulted. Recommendation is for amputation Patient wants to switch doctors from Dr. [name] but currently agreeable for performing amputation. Patient has seen by podiatry in the past at [facility] Dr. Romualdo Bolk and has gone through multiple excisional surgeries with them. Will follow recommendation from orthopedics. Patient agreeable to proceed with amputation with orthopedic surgeon Dr. Odis Hollingshead.  Tentatively surgery planned for Wednesday.  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 Thoracic and lumbar MRI  spine on 4/10 did not show any evidence of active infection. On repeat MRI on 4/22 due to ongoing back pain there were evidence of osteomyelitis discitis as well as abscess. Lumbar spine shows no evidence of discitis osteomyelitis Neurosurgery Dr. Yetta Barre was consulted, recommended no surgical intervention. Will require repeat MRI.  Probably in 1 week~4/29, will await ID recommendation.  Duodenitis  seen on admission CT abdomen. Currently no abdominal symptoms. Monitor.   Pain control. Patient reports ongoing pain control issues. Currently on IV Dilaudid as well as oxycodone. Also scheduled Robaxin.  Lyrica on 4/26, dose increased on 4/27 Monitor.   Peripheral vascular disease with critical limb ischemia. Vascular surgery was consulted by Dr. [name] Underwent angiogram 4/25 with angioplasty of right below-knee popliteal artery, tibioperoneal trunk, and peroneal artery   Recommendation for aspirin Plavix and statin.   Hypervolemic hyponatremia. Sodium level improving. Nephrology was consulted.  Treated with tolvaptan without any response.  Later on responded well to diuresis. Monitor.   AKI on CKD 3A. Baseline 1.1.  On admission 1.4. Currently serum creatinine stable. Monitor.   HTN. Blood pressure stable. Currently continuing current regimen.   Oral thrush. Nystatin. Resolved.   Type of diabetes mellitus uncontrolled with hyperglycemia with long-term insulin use. Hemoglobin A1c 9.1 Currently on 20 units of Semglee and sliding scale insulin. Dose adjusted.  Change Ensure to Glucerna. Monitor.   Chronic A-fib. Currently rate controlled. Eliquis on hold. Currently switching to Lovenox.   Adult failure to thrive. No evidence of protein-calorie malnutrition so far. Continue supplementation.   Acute on chronic combined CHF. Echocardiogram currently 60-65%.  No wall motion  abnormality.   Treated with IV diuresis. Currently on 3.125 mg Coreg, digoxin 0.125 mcg, Lasix 40 mg oral daily. Aldactone was on hold.  Resumed on 4/28.  Entresto on hold. Also on aspirin and Plavix. Appears euvolemic.   Anemia of chronic disease In the setting of osteomyelitis. H&H relatively stable.  Monitor.  No active bleeding.   Anxiety. Patient is on BuSpar 10 mg twice daily I will increase the dose to 15 mg 3 times daily. Already on Lyrica. Continue Atarax as needed.  Subjective: Patient seen and examined at bedside today.  Patient currently denies having any shortness of breath but does states that there is chest soreness when he takes a deep breath    physical Exam: General: Appear in mild distress; Cardiovascular: S1 and S2 Present, no Murmur, Respiratory: good respiratory effort, Bilateral Air entry present, basal Crackles, no wheezes Abdomen: Bowel Sound present, Non tender  Extremities: bilateral Pedal edema Neurology: alert and oriented to time, place, and person  Data Reviewed: I have Reviewed nursing notes, Vitals, and Lab results. Since last encounter, pertinent lab results CBC and BMP   . I have ordered test including CBC and BMP ESR and CRP  . I have discussed pt's care plan and test results with orthopedic surgery  .   Disposition: Status is: Inpatient Remains inpatient appropriate because: Need IV antibiotics and further workup  Family Communication: No one at bedside Level of care: Telemetry Surgical continue for  Vitals:   08/01/22 0500 08/01/22 0538 08/01/22 0736 08/01/22 0921  BP:  117/83  (!) 137/95  Pulse:  80 76 76  Resp:  18 18 18   Temp:  98.1 F (36.7 C)  99.1 F (37.3 C)  TempSrc:  Oral  Oral  SpO2:  96% 98% 96%  Weight: 80.6 kg     Height:  Author: Harold Hedge, MD 08/01/2022 2:23 PM  Please look on [email or web address] to find out who is on call.

## 2022-08-01 NOTE — Plan of Care (Signed)
  Problem: Education: Goal: Knowledge of disease or condition will improve Outcome: Progressing Goal: Knowledge of the prescribed therapeutic regimen will improve Outcome: Progressing Goal: Individualized Educational Video(s) Outcome: Progressing   Problem: Activity: Goal: Ability to tolerate increased activity will improve Outcome: Progressing Goal: Will verbalize the importance of balancing activity with adequate rest periods Outcome: Progressing   Problem: Respiratory: Goal: Ability to maintain a clear airway will improve Outcome: Progressing Goal: Levels of oxygenation will improve Outcome: Progressing Goal: Ability to maintain adequate ventilation will improve Outcome: Progressing   Problem: Activity: Goal: Ability to tolerate increased activity will improve Outcome: Progressing   Problem: Clinical Measurements: Goal: Ability to maintain a body temperature in the normal range will improve Outcome: Progressing   Problem: Respiratory: Goal: Ability to maintain adequate ventilation will improve Outcome: Progressing Goal: Ability to maintain a clear airway will improve Outcome: Progressing   Problem: Education: Goal: Ability to describe self-care measures that may prevent or decrease complications (Diabetes Survival Skills Education) will improve Outcome: Progressing Goal: Individualized Educational Video(s) Outcome: Progressing   Problem: Coping: Goal: Ability to adjust to condition or change in health will improve Outcome: Progressing   Problem: Fluid Volume: Goal: Ability to maintain a balanced intake and output will improve Outcome: Progressing   Problem: Health Behavior/Discharge Planning: Goal: Ability to identify and utilize available resources and services will improve Outcome: Progressing Goal: Ability to manage health-related needs will improve Outcome: Progressing   Problem: Metabolic: Goal: Ability to maintain appropriate glucose levels will  improve Outcome: Progressing   Problem: Nutritional: Goal: Maintenance of adequate nutrition will improve Outcome: Progressing Goal: Progress toward achieving an optimal weight will improve Outcome: Progressing   Problem: Skin Integrity: Goal: Risk for impaired skin integrity will decrease Outcome: Progressing   Problem: Tissue Perfusion: Goal: Adequacy of tissue perfusion will improve Outcome: Progressing   Problem: Education: Goal: Knowledge of General Education information will improve Description: Including pain rating scale, medication(s)/side effects and non-pharmacologic comfort measures Outcome: Progressing   Problem: Health Behavior/Discharge Planning: Goal: Ability to manage health-related needs will improve Outcome: Progressing   Problem: Clinical Measurements: Goal: Ability to maintain clinical measurements within normal limits will improve Outcome: Progressing Goal: Will remain free from infection Outcome: Progressing Goal: Diagnostic test results will improve Outcome: Progressing Goal: Respiratory complications will improve Outcome: Progressing Goal: Cardiovascular complication will be avoided Outcome: Progressing   Problem: Activity: Goal: Risk for activity intolerance will decrease Outcome: Progressing   Problem: Nutrition: Goal: Adequate nutrition will be maintained Outcome: Progressing   Problem: Coping: Goal: Level of anxiety will decrease Outcome: Progressing   Problem: Elimination: Goal: Will not experience complications related to bowel motility Outcome: Progressing Goal: Will not experience complications related to urinary retention Outcome: Progressing   Problem: Pain Managment: Goal: General experience of comfort will improve Outcome: Progressing   Problem: Safety: Goal: Ability to remain free from injury will improve Outcome: Progressing   Problem: Skin Integrity: Goal: Risk for impaired skin integrity will decrease Outcome:  Progressing   Problem: Education: Goal: Understanding of CV disease, CV risk reduction, and recovery process will improve Outcome: Progressing Goal: Individualized Educational Video(s) Outcome: Progressing   Problem: Cardiovascular: Goal: Ability to achieve and maintain adequate cardiovascular perfusion will improve Outcome: Progressing Goal: Vascular access site(s) Level 0-1 will be maintained Outcome: Progressing   Problem: Health Behavior/Discharge Planning: Goal: Ability to safely manage health-related needs after discharge will improve Outcome: Progressing

## 2022-08-01 NOTE — Progress Notes (Signed)
Mobility Specialist: Progress Note   08/01/22 1130  Mobility  Activity Ambulated with assistance in hallway  Level of Assistance Contact guard assist, steadying assist  Assistive Device None  Distance Ambulated (ft) 470 ft  Activity Response Tolerated well  Mobility Referral Yes  $Mobility charge 1 Mobility   Post-Mobility: 88 HR  Pt received in the bed and agreeable to mobility. Mod I with bed mobility and contact guard during ambulation. C/o pain in his Rt foot, no rating given. No c/o dizziness or SOB. Pt sitting EOB after session and set up with his lunch. Pt has call bell at his side.   Makaylyn Sinyard Mobility Specialist Please contact via SecureChat or Rehab office at [phone]

## 2022-08-01 NOTE — Plan of Care (Signed)
  Problem: Education: Goal: Knowledge of disease or condition will improve Outcome: Progressing   Problem: Activity: Goal: Ability to tolerate increased activity will improve Outcome: Progressing   Problem: Respiratory: Goal: Ability to maintain a clear airway will improve Outcome: Progressing   Problem: Respiratory: Goal: Ability to maintain a clear airway will improve Outcome: Progressing   Problem: Coping: Goal: Ability to adjust to condition or change in health will improve Outcome: Progressing   Problem: Health Behavior/Discharge Planning: Goal: Ability to manage health-related needs will improve Outcome: Progressing

## 2022-08-01 NOTE — Progress Notes (Signed)
Pharmacy Antibiotic Note  David [name] is a 62 y.o. 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  Pharmacy has been consulted for Vancomycin dosing.  A Vancomycin peak/trough resulted as 25/14 mcg/ml respectively for an estimated AUC of 498 which is therapeutic on 4/26. Renal function is stable.  Plan: - Continue Vancomycin 1g IV every 12 hours - Will continue to follow renal function, culture results, LOT, and antibiotic de-escalation plans   Height: 6' (182.9 cm) Weight: 80.6 kg (177 lb 11.1 oz) IBW/kg (Calculated) : 77.6  Temp (24hrs), Avg:98.5 F (36.9 C), Min:98.1 F (36.7 C), Max:99.1 F (37.3 C)  Recent Labs  Lab 07/28/22 0224 07/29/22 0146 07/29/22 0830 07/30/22 0132 07/31/22 0107 07/31/22 1024 08/01/22 0131  WBC 8.5 8.1  --  8.7 9.7  --  10.3  CREATININE 1.16 1.21  --  1.10 1.11 1.12 1.20  VANCOTROUGH  --   --  14*  --   --   --   --   VANCOPEAK  --  25*  --   --   --   --   --      Estimated Creatinine Clearance: 71.9 mL/min (by C-G formula based on SCr of 1.2 mg/dL).    No Known Allergies  Antimicrobials this admission: Azithro 4/9 x 1 CRO 4/19 x 1 Vancomycin 4/10 >> 4/12; restart 4/23 >> Daptomycin 4/12 >> 4/14; restart 4/21 >> 4/22 Oritavancin 1200 mg 4/15 x 1   Microbiology results: 4/9 COVID >> neg 4/9 RVP >> neg 4/9 BCx >> 4/4 MRSA (S-Vanc/Dapto) 4/11 BCx >> ngF 4/21 BCx >> 2/2 MRSA (pending sensi) 4/24 BCx: neg 4/24 Empyema cx: neg  Thank you for involving pharmacy in this patient's care.  Loura Back, PharmD, BCPS Clinical Pharmacist Clinical phone for 08/01/2022 is [phone] 08/01/2022 11:32 AM

## 2022-08-02 ENCOUNTER — Inpatient Hospital Stay (HOSPITAL_COMMUNITY): Payer: Medicaid Other | Admitting: Certified Registered Nurse Anesthetist

## 2022-08-02 ENCOUNTER — Encounter (HOSPITAL_COMMUNITY): Payer: Self-pay | Admitting: Internal Medicine

## 2022-08-02 ENCOUNTER — Inpatient Hospital Stay (HOSPITAL_COMMUNITY): Payer: Medicaid Other

## 2022-08-02 ENCOUNTER — Encounter (HOSPITAL_COMMUNITY)
Admission: EM | Disposition: A | Discharge status: Home Health | Payer: Self-pay | Source: Home / Self Care | Attending: Internal Medicine

## 2022-08-02 DIAGNOSIS — Z87891 Personal history of nicotine dependence: Secondary | ICD-10-CM

## 2022-08-02 DIAGNOSIS — I110 Hypertensive heart disease with heart failure: Secondary | ICD-10-CM

## 2022-08-02 DIAGNOSIS — L089 Local infection of the skin and subcutaneous tissue, unspecified: Secondary | ICD-10-CM

## 2022-08-02 DIAGNOSIS — I509 Heart failure, unspecified: Secondary | ICD-10-CM

## 2022-08-02 DIAGNOSIS — E1149 Type 2 diabetes mellitus with other diabetic neurological complication: Secondary | ICD-10-CM

## 2022-08-02 HISTORY — PX: IRRIGATION AND DEBRIDEMENT FOOT: SHX6602

## 2022-08-02 HISTORY — PX: BONE BIOPSY: SHX375

## 2022-08-02 HISTORY — PX: AMPUTATION: SHX166

## 2022-08-02 LAB — GLUCOSE, CAPILLARY
Glucose-Capillary: 188 mg/dL — ABNORMAL HIGH (ref 70–99)
Glucose-Capillary: 262 mg/dL — ABNORMAL HIGH (ref 70–99)
Glucose-Capillary: 125 mg/dL — ABNORMAL HIGH (ref 70–99)
Glucose-Capillary: 143 mg/dL — ABNORMAL HIGH (ref 70–99)
Glucose-Capillary: 136 mg/dL — ABNORMAL HIGH (ref 70–99)
Glucose-Capillary: 139 mg/dL — ABNORMAL HIGH (ref 70–99)
Glucose-Capillary: 240 mg/dL — ABNORMAL HIGH (ref 70–99)
Glucose-Capillary: 247 mg/dL — ABNORMAL HIGH (ref 70–99)

## 2022-08-02 LAB — MRSA NEXT GEN BY PCR, NASAL: MRSA by PCR Next Gen: DETECTED — AB

## 2022-08-02 LAB — AEROBIC/ANAEROBIC CULTURE W GRAM STAIN (SURGICAL/DEEP WOUND)

## 2022-08-02 SURGERY — AMPUTATION, FOOT, RAY
Anesthesia: General | Site: Foot | Laterality: Right

## 2022-08-02 MED ORDER — LIDOCAINE 2% (20 MG/ML) 5 ML SYRINGE
INTRAMUSCULAR | Status: AC
  Filled 2022-08-02: qty 5

## 2022-08-02 MED ORDER — ACETAMINOPHEN 160 MG/5ML PO SOLN: 1000.0000 mg | Freq: Once | ORAL | Status: DC | PRN

## 2022-08-02 MED ORDER — INSULIN ASPART 100 UNIT/ML IJ SOLN: 0.0000 [IU] | INTRAMUSCULAR | Status: DC | PRN

## 2022-08-02 MED ORDER — VANCOMYCIN HCL 500 MG IV SOLR
INTRAVENOUS | Status: DC | PRN
  Administered 2022-08-02: 500 mg via TOPICAL

## 2022-08-02 MED ORDER — CEFAZOLIN SODIUM-DEXTROSE 1-4 GM/50ML-% IV SOLN
1.0000 g | Freq: Three times a day (TID) | INTRAVENOUS | Status: DC
  Administered 2022-08-02: 1 g via INTRAVENOUS
  Filled 2022-08-02 (×2): qty 50

## 2022-08-02 MED ORDER — OXYCODONE HCL 5 MG PO TABS: 5.0000 mg | ORAL_TABLET | Freq: Once | ORAL | Status: DC | PRN

## 2022-08-02 MED ORDER — PHENYLEPHRINE 80 MCG/ML (10ML) SYRINGE FOR IV PUSH (FOR BLOOD PRESSURE SUPPORT)
PREFILLED_SYRINGE | INTRAVENOUS | Status: AC
  Filled 2022-08-02: qty 10

## 2022-08-02 MED ORDER — FENTANYL CITRATE (PF) 250 MCG/5ML IJ SOLN
INTRAMUSCULAR | Status: AC
  Filled 2022-08-02: qty 5

## 2022-08-02 MED ORDER — CEFAZOLIN SODIUM-DEXTROSE 2-3 GM-%(50ML) IV SOLR
INTRAVENOUS | Status: DC | PRN
  Administered 2022-08-02: 2 g via INTRAVENOUS

## 2022-08-02 MED ORDER — PROPOFOL 10 MG/ML IV BOLUS
INTRAVENOUS | Status: AC
  Filled 2022-08-02: qty 20

## 2022-08-02 MED ORDER — OXYCODONE HCL 5 MG/5ML PO SOLN: 5.0000 mg | Freq: Once | ORAL | Status: DC | PRN

## 2022-08-02 MED ORDER — CEFAZOLIN SODIUM-DEXTROSE 2-4 GM/100ML-% IV SOLN
2.0000 g | Freq: Three times a day (TID) | INTRAVENOUS | Status: DC
  Administered 2022-08-03 – 2022-08-08 (×17): 2 g via INTRAVENOUS
  Filled 2022-08-02 (×17): qty 100

## 2022-08-02 MED ORDER — FENTANYL CITRATE (PF) 100 MCG/2ML IJ SOLN: 25.0000 ug | INTRAMUSCULAR | Status: DC | PRN

## 2022-08-02 MED ORDER — 0.9 % SODIUM CHLORIDE (POUR BTL) OPTIME
TOPICAL | Status: DC | PRN
  Administered 2022-08-02: 1000 mL
  Administered 2022-08-02 (×2): 3000 mL

## 2022-08-02 MED ORDER — CHLORHEXIDINE GLUCONATE 0.12 % MT SOLN
15.0000 mL | Freq: Once | OROMUCOSAL | Status: AC
  Administered 2022-08-02: 15 mL via OROMUCOSAL

## 2022-08-02 MED ORDER — CEFAZOLIN SODIUM 1 G IJ SOLR
INTRAMUSCULAR | Status: AC
  Filled 2022-08-02: qty 20

## 2022-08-02 MED ORDER — MIDAZOLAM HCL 2 MG/2ML IJ SOLN
INTRAMUSCULAR | Status: AC
  Filled 2022-08-02: qty 2

## 2022-08-02 MED ORDER — ACETAMINOPHEN 500 MG PO TABS: 1000.0000 mg | ORAL_TABLET | Freq: Once | ORAL | Status: DC | PRN

## 2022-08-02 MED ORDER — PROPOFOL 10 MG/ML IV BOLUS
INTRAVENOUS | Status: DC | PRN
  Administered 2022-08-02: 120 mg via INTRAVENOUS

## 2022-08-02 MED ORDER — ORAL CARE MOUTH RINSE: 15.0000 mL | Freq: Once | OROMUCOSAL | Status: AC

## 2022-08-02 MED ORDER — ONDANSETRON HCL 4 MG/2ML IJ SOLN
INTRAMUSCULAR | Status: AC
  Filled 2022-08-02: qty 2

## 2022-08-02 MED ORDER — LACTATED RINGERS IV SOLN: INTRAVENOUS | Status: DC

## 2022-08-02 MED ORDER — VANCOMYCIN HCL 500 MG IV SOLR
INTRAVENOUS | Status: AC
  Filled 2022-08-02: qty 10

## 2022-08-02 MED ORDER — MUPIROCIN 2 % EX OINT
1.0000 | TOPICAL_OINTMENT | Freq: Two times a day (BID) | CUTANEOUS | Status: AC
  Administered 2022-08-02 – 2022-08-06 (×10): 1 via NASAL
  Filled 2022-08-02: qty 22

## 2022-08-02 MED ORDER — FENTANYL CITRATE (PF) 250 MCG/5ML IJ SOLN
INTRAMUSCULAR | Status: DC | PRN
  Administered 2022-08-02: 25 ug via INTRAVENOUS
  Administered 2022-08-02: 50 ug via INTRAVENOUS

## 2022-08-02 MED ORDER — ACETAMINOPHEN 10 MG/ML IV SOLN: 1000.0000 mg | Freq: Once | INTRAVENOUS | Status: DC | PRN

## 2022-08-02 MED ORDER — PHENYLEPHRINE 80 MCG/ML (10ML) SYRINGE FOR IV PUSH (FOR BLOOD PRESSURE SUPPORT)
PREFILLED_SYRINGE | INTRAVENOUS | Status: DC | PRN
  Administered 2022-08-02: 120 ug via INTRAVENOUS
  Administered 2022-08-02: 80 ug via INTRAVENOUS
  Administered 2022-08-02: 160 ug via INTRAVENOUS
  Administered 2022-08-02: 40 ug via INTRAVENOUS
  Administered 2022-08-02 (×2): 120 ug via INTRAVENOUS

## 2022-08-02 MED ORDER — ONDANSETRON HCL 4 MG/2ML IJ SOLN
INTRAMUSCULAR | Status: DC | PRN
  Administered 2022-08-02: 4 mg via INTRAVENOUS

## 2022-08-02 MED ORDER — MIDAZOLAM HCL 2 MG/2ML IJ SOLN
INTRAMUSCULAR | Status: DC | PRN
  Administered 2022-08-02 (×2): 1 mg via INTRAVENOUS

## 2022-08-02 MED ORDER — LIDOCAINE 2% (20 MG/ML) 5 ML SYRINGE
INTRAMUSCULAR | Status: DC | PRN
  Administered 2022-08-02: 40 mg via INTRAVENOUS

## 2022-08-02 SURGICAL SUPPLY — 38 items
BAG COUNTER SPONGE SURGICOUNT (BAG) ×3 IMPLANT
BAG SPNG CNTER NS LX DISP (BAG) ×2
BLADE SAW SGTL 81X20 HD (BLADE) IMPLANT
BLADE SAW SGTL MED 73X18.5 STR (BLADE) IMPLANT
BNDG CMPR MED 10X6 ELC LF (GAUZE/BANDAGES/DRESSINGS) ×2
BNDG COHESIVE 4X5 TAN STRL (GAUZE/BANDAGES/DRESSINGS) ×3 IMPLANT
BNDG ELASTIC 6X10 VLCR STRL LF (GAUZE/BANDAGES/DRESSINGS) IMPLANT
BNDG GAUZE DERMACEA FLUFF 4 (GAUZE/BANDAGES/DRESSINGS) ×3 IMPLANT
BNDG GZE DERMACEA 4 6PLY (GAUZE/BANDAGES/DRESSINGS) ×2
COVER SURGICAL LIGHT HANDLE (MISCELLANEOUS) ×6 IMPLANT
DRAPE OEC MINIVIEW 54X84 (DRAPES) IMPLANT
DRAPE U-SHAPE 47X51 STRL (DRAPES) ×6 IMPLANT
DRSG ADAPTIC 3X8 NADH LF (GAUZE/BANDAGES/DRESSINGS) ×3 IMPLANT
DURAPREP 26ML APPLICATOR (WOUND CARE) ×3 IMPLANT
ELECT REM PT RETURN 9FT ADLT (ELECTROSURGICAL) ×2
ELECTRODE REM PT RTRN 9FT ADLT (ELECTROSURGICAL) ×3 IMPLANT
GAUZE PAD ABD 8X10 STRL (GAUZE/BANDAGES/DRESSINGS) ×6 IMPLANT
GAUZE SPONGE 4X4 12PLY STRL (GAUZE/BANDAGES/DRESSINGS) ×3 IMPLANT
GLOVE BIOGEL PI IND STRL 9 (GLOVE) ×3 IMPLANT
GLOVE SURG ORTHO 9.0 STRL STRW (GLOVE) ×3 IMPLANT
GOWN STRL REUS W/ TWL XL LVL3 (GOWN DISPOSABLE) ×6 IMPLANT
GOWN STRL REUS W/TWL XL LVL3 (GOWN DISPOSABLE) ×4
KIT BASIN OR (CUSTOM PROCEDURE TRAY) ×3 IMPLANT
KIT TURNOVER KIT B (KITS) ×3 IMPLANT
NS IRRIG 1000ML POUR BTL (IV SOLUTION) ×3 IMPLANT
PACK ORTHO EXTREMITY (CUSTOM PROCEDURE TRAY) ×3 IMPLANT
PAD ARMBOARD 7.5X6 YLW CONV (MISCELLANEOUS) ×6 IMPLANT
SET IRRIG Y TYPE TUR BLADDER L (SET/KITS/TRAYS/PACK) IMPLANT
SLEEVE SURGEON STRL (DRAPES) IMPLANT
STOCKINETTE IMPERVIOUS LG (DRAPES) IMPLANT
SUT ETHILON 2 0 PSLX (SUTURE) ×3 IMPLANT
SUT PDS AB 2-0 CT2 27 (SUTURE) IMPLANT
SUT PROLENE 2 0 SH DA (SUTURE) IMPLANT
SWAB COLLECTION DEVICE MRSA (MISCELLANEOUS) IMPLANT
SWAB CULTURE ESWAB REG 1ML (MISCELLANEOUS) IMPLANT
TOWEL GREEN STERILE (TOWEL DISPOSABLE) ×3 IMPLANT
TUBE CONNECTING 12X1/4 (SUCTIONS) ×3 IMPLANT
YANKAUER SUCT BULB TIP NO VENT (SUCTIONS) ×3 IMPLANT

## 2022-08-02 NOTE — Op Note (Addendum)
08/02/2022  6:40 PM   PATIENT: David [name]  62 y.o. 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   INDICATION FOR PROCEDURE: The patient presented with above diagnosis.  We discussed the diagnosis, alternative treatment options, risks and benefits of the above surgical intervention, as well as alternative non-operative treatments. All questions/concerns were addressed and the patient/family demonstrated appropriate understanding of the diagnosis, the procedure, the postoperative course, and overall prognosis. The patient wished to proceed with surgical intervention and signed an informed surgical consent as such, in each others presence prior to surgery.   PROCEDURE IN DETAIL: After preoperative consent was obtained and the correct operative site was identified, the patient was brought to the operating room supine on stretcher and transferred onto operating table. General anesthesia was induced. Preoperative antibiotics were administered. Surgical timeout was taken. The patient was then positioned supine with an ipsilateral hip bump. The operative lower extremity was prepped and draped in standard sterile fashion with a tourniquet around the thigh. The extremity was gravity exsanguinated and the tourniquet was inflated to 275 mmHg.  We began by utilizing the prior amputation incision over the distal lateral foot. This approach was planned to ellipse and excise the draining sinus tract. That sinus tract was sent for pathology  analysis. Deep culture swabs were taken for both aerobic and anaerobic cultures. The fifth metatarsal was partially resected to perform the ray amputation. The resected bone was sent for both pathology and microbiology analysis. This resection was verified both directly and fluoroscopically to ensure no sharp prominences remained. A series of curettes and rongeurs were used to debride the surgical bed and remove necrotic tissue until healthy bleeding established.  The surgical sites were thoroughly irrigated. In total, we irrigated the surgical site with 7 liters of normal saline using cysto tubing. Betadine solution and vancomycin powder were applied. The tourniquet was deflated and hemostasis achieved. The deep layers were closed using 2-0 PDS. The skin was loosely approximated without tension using 2-0 prolene suture.    The leg was cleaned with saline and sterile dressings with gauze were applied. A well padded loose wrap was applied. The patient was awakened from anesthesia and transported to the recovery room in stable condition.    FOLLOW UP PLAN: -transfer to PACU, then return to [facility] under Medicine primary team -strict heel weightbearing operative extremity, maximum elevation. Postop shoe when out of bed -daily dry dressing change by bedside nursing starting POD1 -DVT ppx: per primary team -Abx: per primary team (trend intra-op cx). Consider addition of gram negative coverage for potential polymicrobial infection given location of wound and high risk for further amputation (Ancef ordered) -follow up as outpatient within 7-10 days for wound check -sutures out in 3-4 weeks in outpatient office   RADIOGRAPHS: AP, lateral, oblique radiographs of the right foot were obtained intraoperatively. These showed interval fifth ray amputation. No other acute injuries are noted.   [name] Orthopaedic Surgery [facility]

## 2022-08-02 NOTE — Progress Notes (Signed)
[facility] Progress Note Patient: David [name] WUJ:[id number] DOB: February 18, 1961 DOA: 07/12/2022  DOS: the patient was seen and examined on 08/02/2022  Brief hospital course: PMH of chronic combined CHF, chronic A-fib on Eliquis, type II DM, prior amputation of right foot, HTN.  Present to the hospital with complaints of fever.  Found to have MRSA bacteremia as well as multiple abscesses.  Originally admitted at [facility] on 4/9.  Blood cultures came positive for MRSA 4/10 ID was consulted. 4/12 TTE was negative, TEE was done which was negative for vegetation 4/18 nephrology consulted for AKI. 4/22 neurosurgery [facility]/ Dr Yetta Barre were consulted. Recommend medical management with IV antibiotics only without any intervention. Neurosurgery was consulted and recommend conservative measures. 4/23 Dr. [name] was consulted for amputation.  Vascular surgery was consulted for concerns for PVD. 4/24 IR placed right-sided chest tube for pleural effusion 4/25 underwent right below-knee popliteal artery, tibioperoneal trunk and peroneal angioplasty with Dr. Chestine Spore. 4/26 IR remove the chest tube.  Follow-up chest x-ray still shows pleural effusions. 4/27 patient currently does not want to perform the procedure with Dr. [name].  Informed Dr. [name] as well as consulted Dr. Linna Caprice. 4/29: Patient agreeable to proceed with amputation surgery with orthopedic surgeon Dr. Odis Hollingshead 4/30: Patient currently n.p.o. and is awaiting to undergo right fifth ray revision amputation.    Assessment and Plan: Sepsis secondary to MRSA bacteremia. Meeting SIRS criteria on admission with tachycardia and tachypnea. Also with hyperglycemia and leukocytosis. Blood cultures grew MRSA.  This is complicated by infection spread in multiple body areas. ID was consulted.  Repeat blood cultures negative initially. PICC line was placed on 4/13. Echocardiogram and TEE negative for vegetation. Due to reoccurrence of the fever had  another culture drawn on 4/21 which was positive for MRSA again. Repeat culture on 4/24 negative so far for any new growth. PICC line removed on 4/25.  Bilateral multifocal pneumonia seen on x-ray on 4/17 Right-sided loculated possible parapneumonic effusion. Patient had a chest x-ray at the time of admission which was negative for any acute abnormality. On a follow-up chest x-ray developed left lower lobe abnormalities concerning for pneumonia and after that bilateral concerning infiltrates. Spinal infiltrate showed large right pleural effusion with thick enhancing ring concerning for empyema. Reportedly pulmonary was consulted and recommended IR guided chest tube placement. Chest tube was placed on 4/24. 200 cc of blood-tinged fluid was aspirated. Cultures negative from the area. IR decided to remove the chest tube on 4/26.  Follow-up chest x-ray on 4/26 shows persistent loculated right pleural effusion with atelectasis. Repeat two-view chest x-ray shows persistent right pleural effusion unchanged after chest tube removal and significantly better compared to the one before chest tube placement. ESR elevated CRP lower. For now we will monitor but if he continues to have chest pain May require formal evaluation by pulmonary and further workup of his effusion.  History of amputation of fifth and fourth toe Acute osteomyelitis of residual fifth metatarsal diaphysis with soft tissue infection with sinus tract Orthopedic consulted. Recommendation is for amputation Patient wants to switch doctors from Dr. [name] but currently agreeable for performing amputation. Patient has seen by podiatry in the past at [facility] Dr. Romualdo Bolk and has gone through multiple excisional surgeries with them. Patient was evaluated by orthopedic surgery who recommended right fifth ray revision amputation patient was willing to proceed and surgery is planned today patient is currently in n.p.o. status.  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 Thoracic and lumbar MRI spine on 4/10 did not show any evidence of active infection. On repeat MRI on 4/22 due to ongoing back pain there were evidence of osteomyelitis discitis as well as abscess. Lumbar spine shows no evidence of discitis osteomyelitis Neurosurgery Dr. Yetta Barre was consulted, recommended no surgical intervention. Will require repeat MRI.  Probably in 1 week~4/29, will await ID recommendation.  Duodenitis  seen on admission CT abdomen. Currently no abdominal symptoms. Monitor.   Pain control. Patient reports ongoing pain control issues. Currently on IV Dilaudid as well as oxycodone. Also scheduled Robaxin.  Lyrica on 4/26, dose increased on 4/27 Monitor.   Peripheral vascular disease with critical limb ischemia. Vascular surgery was consulted by Dr. [name] Underwent angiogram 4/25 with angioplasty of right below-knee popliteal artery, tibioperoneal trunk, and peroneal artery   Recommendation for aspirin Plavix and statin.   Hypervolemic hyponatremia. Sodium level improving. Nephrology was consulted.  Treated with tolvaptan without any response.  Later on responded well to diuresis. Monitor.   AKI on CKD 3A. Baseline 1.1.  On admission 1.4. Currently serum creatinine stable. Monitor.   Hypomagnesemia: - Current magnesium of 1.6 was replenished with 2 g of magnesium sulfate.    HTN. Blood pressure stable. Currently continuing current regimen.   Oral thrush. Nystatin. Resolved.   Type of diabetes mellitus uncontrolled with hyperglycemia with long-term insulin use. Hemoglobin A1c 9.1 Currently on 20 units of Semglee and sliding scale insulin. Dose adjusted.  Change Ensure to Glucerna. Monitor.   Chronic A-fib. Currently rate controlled. Eliquis on hold. Currently switching to  Lovenox.   Adult failure to thrive. No evidence of protein-calorie malnutrition so far. Continue supplementation.   Acute on chronic combined CHF. Echocardiogram currently 60-65%.  No wall motion abnormality.   Treated with IV diuresis. Currently on 3.125 mg Coreg, digoxin 0.125 mcg, Lasix 40 mg oral daily. Aldactone was on hold.  Resumed on 4/28.  Entresto on hold. Also on aspirin and Plavix. Appears euvolemic.   Anemia of chronic disease In the setting of osteomyelitis. H&H relatively stable.  Monitor.  No active bleeding.   Anxiety. Patient is on BuSpar 10 mg twice daily I will increase the dose to 15 mg 3 times daily. Already on Lyrica. Continue Atarax as needed.  Subjective: Patient seen and examined at bedside today.  Patient has no new complaints currently n.p.o. status awaiting to undergo amputation surgery today. physical Exam: General: Appear in mild distress; Cardiovascular: S1 and S2 Present, no Murmur, Respiratory: good respiratory effort, Bilateral Air entry present, basal Crackles, no wheezes Abdomen: Bowel Sound present, Non tender  Extremities: bilateral Pedal edema Neurology: alert and oriented to time, place, and person  Data Reviewed: I have Reviewed nursing notes, Vitals, and Lab results. Since last encounter, pertinent lab results CBC and BMP   . I have ordered test including CBC and BMP ESR and CRP  . I have discussed pt's care plan and test results with orthopedic surgery  .   Disposition: Status is: Inpatient Remains inpatient appropriate because: Need IV antibiotics and further workup  Family Communication: No one at bedside Level of care: Telemetry Surgical continue for  Vitals:   08/02/22 1245 08/02/22 1300 08/02/22 1315 08/02/22 1331  BP: 98/60 102/71 113/79 (!) 148/99  Pulse: 74 67 76 80  Resp: 13 18 20 17   Temp: 98.5 F (36.9 C)  98.5 F (36.9  C) 97.6 F (36.4 C)  TempSrc:    Oral  SpO2: 94% 93% 92% 97%  Weight:      Height:          Author: Harold Hedge, MD 08/02/2022 2:09 PM  Please look on [email or web address] to find out who is on call.

## 2022-08-02 NOTE — Progress Notes (Signed)
Pt came back to rm 14 from PACU. Reinitiated tele. VSS. Call bell within reach.   Lawson Radar, RN

## 2022-08-02 NOTE — Anesthesia Preprocedure Evaluation (Signed)
Anesthesia Evaluation  Patient identified by MRN, date of birth, ID band Patient awake    Reviewed: Allergy & Precautions, NPO status , Patient's Chart, lab work & pertinent test results  History of Anesthesia Complications Negative for: history of anesthetic complications  Airway Mallampati: III  TM Distance: >3 FB Neck ROM: Full    Dental  (+) Dental Advisory Given   Pulmonary sleep apnea , pneumonia, resolved, former smoker   breath sounds clear to auscultation       Cardiovascular hypertension, Pt. on medications and Pt. on home beta blockers + Peripheral Vascular Disease and +CHF   Rhythm:Regular   1. Left ventricular ejection fraction, by estimation, is 60 to 65%. The  left ventricle has normal function. The left ventricle has no regional  wall motion abnormalities.   2. Right ventricular systolic function is normal. The right ventricular  size is normal.   3. Left atrial size was severely dilated. No left atrial/left atrial  appendage thrombus was detected.   4. Right atrial size was severely dilated.   5. The mitral valve is normal in structure. Trivial mitral valve  regurgitation. No evidence of mitral stenosis.   6. The aortic valve is normal in structure. Aortic valve regurgitation is  not visualized. No aortic stenosis is present.   7. The inferior vena cava is normal in size with greater than 50%  respiratory variability, suggesting right atrial pressure of 3 mmHg.     Neuro/Psych neg Seizures  Neuromuscular disease    GI/Hepatic negative GI ROS, Neg liver ROS,,,  Endo/Other  diabetes, Type 2  Lab Results      Component                Value               Date                      HGBA1C                   9.1 (H)             07/13/2022             Renal/GU Renal InsufficiencyRenal diseaseLab Results      Component                Value               Date                      CREATININE               1.20                 08/01/2022                Musculoskeletal  (+) Arthritis ,    Abdominal   Peds  Hematology  (+) Blood dyscrasia, anemia Lab Results      Component                Value               Date                      WBC                      10.3  08/01/2022                HGB                      10.0 (L)            08/01/2022                HCT                      29.6 (L)            08/01/2022                MCV                      90.2                08/01/2022                PLT                      336                 08/01/2022              Anesthesia Other Findings   Reproductive/Obstetrics                              Anesthesia Physical Anesthesia Plan  ASA: 3  Anesthesia Plan: General   Post-op Pain Management: Minimal or no pain anticipated   Induction: Intravenous  PONV Risk Score and Plan: 2 and Ondansetron  Airway Management Planned: LMA  Additional Equipment: None  Intra-op Plan:   Post-operative Plan: Extubation in OR  Informed Consent: I have reviewed the patients History and Physical, chart, labs and discussed the procedure including the risks, benefits and alternatives for the proposed anesthesia with the patient or authorized representative who has indicated his/her understanding and acceptance.     Dental advisory given  Plan Discussed with:   Anesthesia Plan Comments:          Anesthesia Quick Evaluation

## 2022-08-02 NOTE — Transfer of Care (Signed)
Immediate Anesthesia Transfer of Care Note  Patient: David [name]  Procedure(s) Performed: AMPUTATION RAY 5TH WITH IRRIGATION AND DEBRIDMENT OF RIGHT FOOT (Right) IRRIGATION AND DEBRIDEMENT RIGHT 5TH METATARSAL FOOT (Foot)  Patient Location: PACU  Anesthesia Type:General  Level of Consciousness: drowsy, patient cooperative, and responds to stimulation  Airway & Oxygen Therapy: Patient Spontanous Breathing and Patient connected to nasal cannula oxygen. Sats 90% on RA, Pt placed on 2LNC for sats 96%  Post-op Assessment: Report given to RN and Post -op Vital signs reviewed and stable  Post vital signs: Reviewed and stable  Last Vitals:  Vitals Value Taken Time  BP 98/60 08/02/22 1245  Temp    Pulse 74 08/02/22 1246  Resp    SpO2 93 % 08/02/22 1246  Vitals shown include unvalidated device data.  Last Pain:  Vitals:   08/02/22 1109  TempSrc:   PainSc: 8       Patients Stated Pain Goal: 0 (08/02/22 1109)  Complications: No notable events documented.

## 2022-08-02 NOTE — H&P (Signed)
H&P Update:  -History and Physical Reviewed  -Patient has been re-examined  -No change in the plan of care  -The risks and benefits were presented and reviewed. The risks due to recurrent/new/persistent infection, stiffness, nerve/vessel/tendon injury or rerupture of repaired tendon, nonunion/malunion, allograft usage, wound healing issues, development of arthritis, failure of this surgery, possibility of delayed definitive surgery, need for further surgery, thromboembolic events, anesthesia/medical complications, amputation, death among others were discussed. The patient acknowledged the explanation, agreed to proceed with the plan and a consent was signed.  [name]

## 2022-08-02 NOTE — Progress Notes (Signed)
Orthopedic Tech Progress Note Patient Details:  David [name] 16-Feb-1961 [id number]  OR RN called requesting a MEDIUM POST OP SHOE to be dropped off at OR DESK    Ortho Devices Type of Ortho Device: Postop shoe/boot Ortho Device/Splint Location: RLE Ortho Device/Splint Interventions: Ordered, Other (comment)   Post Interventions Patient Tolerated: Other (comment) Instructions Provided: Other (comment)  [name] 08/02/2022, 1:19 PM

## 2022-08-02 NOTE — Anesthesia Procedure Notes (Signed)
Procedure Name: LMA Insertion Date/Time: 08/02/2022 11:48 AM  Performed by: Lonia Mad, CRNAPre-anesthesia Checklist: Patient identified, Emergency Drugs available, Suction available and Patient being monitored Patient Re-evaluated:Patient Re-evaluated prior to induction Oxygen Delivery Method: Circle System Utilized Preoxygenation: Pre-oxygenation with 100% oxygen Induction Type: IV induction Ventilation: Mask ventilation without difficulty LMA: LMA inserted LMA Size: 5.0 Number of attempts: 1 Airway Equipment and Method: Bite block Placement Confirmation: positive ETCO2 Tube secured with: Tape Dental Injury: Teeth and Oropharynx as per pre-operative assessment

## 2022-08-03 ENCOUNTER — Encounter (HOSPITAL_COMMUNITY): Payer: Self-pay | Admitting: Orthopaedic Surgery

## 2022-08-03 DIAGNOSIS — Z89421 Acquired absence of other right toe(s): Secondary | ICD-10-CM

## 2022-08-03 DIAGNOSIS — M86172 Other acute osteomyelitis, left ankle and foot: Secondary | ICD-10-CM

## 2022-08-03 LAB — BASIC METABOLIC PANEL
Anion gap: 12 (ref 5–15)
BUN: 21 mg/dL — ABNORMAL HIGH (ref 6–20)
CO2: 23 mmol/L (ref 22–32)
Calcium: 8.9 mg/dL (ref 8.9–10.3)
Chloride: 91 mmol/L — ABNORMAL LOW (ref 98–111)
Creatinine, Ser: 1.58 mg/dL — ABNORMAL HIGH (ref 0.61–1.24)
GFR, Estimated: 50 mL/min — ABNORMAL LOW (ref >60)
Glucose, Bld: 253 mg/dL — ABNORMAL HIGH (ref 70–99)
Potassium: 4.5 mmol/L (ref 3.5–5.1)
Sodium: 126 mmol/L — ABNORMAL LOW (ref 135–145)

## 2022-08-03 LAB — GLUCOSE, CAPILLARY
Glucose-Capillary: 338 mg/dL — ABNORMAL HIGH (ref 70–99)
Glucose-Capillary: 251 mg/dL — ABNORMAL HIGH (ref 70–99)
Glucose-Capillary: 191 mg/dL — ABNORMAL HIGH (ref 70–99)
Glucose-Capillary: 229 mg/dL — ABNORMAL HIGH (ref 70–99)

## 2022-08-03 LAB — AEROBIC/ANAEROBIC CULTURE W GRAM STAIN (SURGICAL/DEEP WOUND)

## 2022-08-03 LAB — COMPREHENSIVE METABOLIC PANEL
ALT: 27 U/L (ref 0–44)
AST: 20 U/L (ref 15–41)
Albumin: 1.8 g/dL — ABNORMAL LOW (ref 3.5–5.0)
Alkaline Phosphatase: 94 U/L (ref 38–126)
Anion gap: 10 (ref 5–15)
BUN: 21 mg/dL — ABNORMAL HIGH (ref 6–20)
CO2: 26 mmol/L (ref 22–32)
Calcium: 8.6 mg/dL — ABNORMAL LOW (ref 8.9–10.3)
Chloride: 91 mmol/L — ABNORMAL LOW (ref 98–111)
Creatinine, Ser: 1.82 mg/dL — ABNORMAL HIGH (ref 0.61–1.24)
GFR, Estimated: 42 mL/min — ABNORMAL LOW (ref >60)
Glucose, Bld: 283 mg/dL — ABNORMAL HIGH (ref 70–99)
Potassium: 4.7 mmol/L (ref 3.5–5.1)
Sodium: 127 mmol/L — ABNORMAL LOW (ref 135–145)
Total Bilirubin: 0.9 mg/dL (ref 0.3–1.2)
Total Protein: 6.9 g/dL (ref 6.5–8.1)

## 2022-08-03 LAB — CBC WITH DIFFERENTIAL/PLATELET
Abs Immature Granulocytes: 0.25 10*3/uL — ABNORMAL HIGH (ref 0.00–0.07)
Basophils Absolute: 0.1 10*3/uL (ref 0.0–0.1)
Basophils Relative: 1 %
Eosinophils Absolute: 0.5 10*3/uL (ref 0.0–0.5)
Eosinophils Relative: 4 %
HCT: 27.2 % — ABNORMAL LOW (ref 39.0–52.0)
Hemoglobin: 9.1 g/dL — ABNORMAL LOW (ref 13.0–17.0)
Immature Granulocytes: 2 %
Lymphocytes Relative: 12 %
Lymphs Abs: 1.4 10*3/uL (ref 0.7–4.0)
MCH: 30.2 pg (ref 26.0–34.0)
MCHC: 33.5 g/dL (ref 30.0–36.0)
MCV: 90.4 fL (ref 80.0–100.0)
Monocytes Absolute: 1.6 10*3/uL — ABNORMAL HIGH (ref 0.1–1.0)
Monocytes Relative: 15 %
Neutro Abs: 7.4 10*3/uL (ref 1.7–7.7)
Neutrophils Relative %: 66 %
Platelets: 370 10*3/uL (ref 150–400)
RBC: 3.01 MIL/uL — ABNORMAL LOW (ref 4.22–5.81)
RDW: 14.1 % (ref 11.5–15.5)
WBC: 11.2 10*3/uL — ABNORMAL HIGH (ref 4.0–10.5)
nRBC: 0 % (ref 0.0–0.2)

## 2022-08-03 LAB — VANCOMYCIN, RANDOM: Vancomycin Rm: 13 ug/mL

## 2022-08-03 MED ORDER — VANCOMYCIN HCL IN DEXTROSE 1-5 GM/200ML-% IV SOLN
1000.0000 mg | Freq: Once | INTRAVENOUS | Status: AC
  Administered 2022-08-03: 1000 mg via INTRAVENOUS
  Filled 2022-08-03: qty 200

## 2022-08-03 MED ORDER — SPIRONOLACTONE 25 MG PO TABS
25.0000 mg | ORAL_TABLET | Freq: Every day | ORAL | Status: DC
  Administered 2022-08-05 – 2022-08-11 (×7): 25 mg via ORAL
  Filled 2022-08-03 (×7): qty 1

## 2022-08-03 MED ORDER — VANCOMYCIN HCL 1250 MG/250ML IV SOLN: 1250.0000 mg | INTRAVENOUS | Status: DC

## 2022-08-03 MED ORDER — INSULIN GLARGINE-YFGN 100 UNIT/ML ~~LOC~~ SOLN
30.0000 [IU] | Freq: Every day | SUBCUTANEOUS | Status: DC
  Administered 2022-08-04 – 2022-08-06 (×3): 30 [IU] via SUBCUTANEOUS
  Filled 2022-08-03 (×4): qty 0.3

## 2022-08-03 MED ORDER — LACTATED RINGERS IV SOLN: INTRAVENOUS | Status: AC

## 2022-08-03 MED ORDER — FUROSEMIDE 40 MG PO TABS
40.0000 mg | ORAL_TABLET | Freq: Every day | ORAL | Status: DC
  Administered 2022-08-05 – 2022-08-10 (×6): 40 mg via ORAL
  Filled 2022-08-03 (×6): qty 1

## 2022-08-03 NOTE — Progress Notes (Signed)
Mobility Specialist: Progress Note   08/03/22 1530  Mobility  Activity Ambulated with assistance in hallway  Level of Assistance Contact guard assist, steadying assist  Assistive Device Front wheel walker  Distance Ambulated (ft) 330 ft  Activity Response Tolerated well  Mobility Referral Yes  $Mobility charge 1 Mobility   Pre-Mobility: 94% SpO2 Post-Mobility: 99 HR, 92% SpO2  Pt received sitting EOB and agreeable to mobility. MinA to stand and contact guard during ambulation. C/o 8/10 back pain, otherwise asymptomatic. Pt sitting EOB after session with call bell and phone in reach.   Brason Berthelot Mobility Specialist Please contact via SecureChat or Rehab office at [phone]

## 2022-08-03 NOTE — Progress Notes (Signed)
Progress Note   Patient: David [name] WUJ:[id number] DOB: [birth year] DOA: 07/12/2022     22 DOS: the patient was seen and examined on 08/03/2022   Brief hospital course: PMH of chronic combined CHF, chronic A-fib on Eliquis, type II DM, prior amputation of right foot, HTN.  Present to the hospital with complaints of fever.  Found to have MRSA bacteremia as well as multiple abscesses.  Originally admitted at [facility] on 4/9.  Blood cultures came positive for MRSA 4/10 ID was consulted. 4/12 TTE was negative, TEE was done which was negative for vegetation 4/18 nephrology consulted for AKI. 4/22 neurosurgery [facility]/ Dr Yetta Barre were consulted. Recommend medical management with IV antibiotics only without any intervention. Neurosurgery was consulted and recommend conservative measures. 4/23 Dr. [name] was consulted for amputation.  Vascular surgery was consulted for concerns for PVD. 4/24 IR placed right-sided chest tube for pleural effusion 4/25 underwent right below-knee popliteal artery, tibioperoneal trunk and peroneal angioplasty with Dr. Chestine Spore. 4/26 IR remove the chest tube.  Follow-up chest x-ray still shows pleural effusions. 4/27 patient currently does not want to perform the procedure with Dr. [name].  Informed Dr. [name] as well as consulted Dr. Linna Caprice. 4/29: Patient agreeable to proceed with amputation surgery with orthopedic surgeon Dr. Odis Hollingshead 4/30: Patient underwent right fifth ray revision amputation.  Assessment and Plan: Sepsis secondary to MRSA bacteremia. Meeting SIRS criteria on admission with tachycardia and tachypnea. Also with hyperglycemia and leukocytosis. Blood cultures grew MRSA.  This is complicated by infection spread in multiple body areas. ID was consulted.  Repeat blood cultures negative initially. PICC line was placed on 4/13. Echocardiogram and TEE negative for vegetation. Due to reoccurrence of the fever had another culture drawn on 4/21 which was  positive for MRSA again. Repeat culture on 4/24 negative so far for any new growth. PICC line removed on 4/25. Per ID, continue current dose of vanc   Bilateral multifocal pneumonia seen on x-ray on 4/17 Right-sided loculated possible parapneumonic effusion. Patient had a chest x-ray at the time of admission which was negative for any acute abnormality. On a follow-up chest x-ray developed left lower lobe abnormalities concerning for pneumonia and after that bilateral concerning infiltrates. Spinal infiltrate showed large right pleural effusion with thick enhancing ring concerning for empyema. Reportedly pulmonary was consulted and recommended IR guided chest tube placement. Chest tube was placed on 4/24. 200 cc of blood-tinged fluid was aspirated. Cultures negative from the area. IR decided to remove the chest tube on 4/26.  Follow-up chest x-ray on 4/26 shows persistent loculated right pleural effusion with atelectasis. Repeat two-view chest x-ray shows persistent right pleural effusion unchanged after chest tube removal and significantly better compared to the one before chest tube placement. ESR elevated CRP lower. For now we will monitor but if he continues to have chest pain May require formal evaluation by pulmonary and further workup of his effusion.   History of amputation of fifth and fourth toe Acute osteomyelitis of residual fifth metatarsal diaphysis with soft tissue infection with sinus tract Orthopedic consulted. Recommendation is for amputation Patient wants to switch doctors from Dr. [name] but currently agreeable for performing amputation. Patient has seen by podiatry in the past at [facility] Dr. Romualdo Bolk and has gone through multiple excisional surgeries with them. 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 Thoracic and lumbar MRI spine on 4/10 did not show any evidence of active infection. On repeat MRI on 4/22 due to ongoing back pain there were evidence of osteomyelitis discitis as well as abscess. Lumbar spine shows no evidence of discitis osteomyelitis Neurosurgery Dr. Yetta Barre was consulted, recommended no surgical intervention. Will require repeat MRI.  Probably in 1 week~4/29, ID recs to cont current vanc.   Duodenitis  seen on admission CT abdomen. Currently no abdominal symptoms. Monitor.   Pain control. Patient reports ongoing pain control issues. Currently on IV Dilaudid as well as oxycodone. Also scheduled Robaxin.  Lyrica on 4/26, dose increased on 4/27 Monitor.   Peripheral vascular disease with critical limb ischemia. Vascular surgery was consulted by Dr. [name] Underwent angiogram 4/25 with angioplasty of right below-knee popliteal artery, tibioperoneal trunk, and peroneal artery   Recommendation for aspirin Plavix and statin.   Hypervolemic hyponatremia. Sodium level improving. Nephrology was consulted.  Treated with tolvaptan without any response.  Later on responded well to diuresis. Monitor.   AKI on CKD 3A. Baseline 1.1.  On admission 1.4. Currently serum creatinine stable. Monitor.   Hypomagnesemia: - Current magnesium of 1.6 was replenished with 2 g of magnesium sulfate.     HTN. Blood pressure stable. Currently continuing current regimen.   Oral thrush. Nystatin. Resolved.   Type of diabetes mellitus uncontrolled with hyperglycemia with long-term insulin use. Hemoglobin A1c 9.1 Currently on 20 units of Semglee and sliding scale insulin. Dose adjusted.  Change Ensure to Glucerna. Monitor.   Chronic A-fib. Currently rate controlled. Eliquis on hold. Currently switching to Lovenox.   Adult failure to thrive. No evidence of protein-calorie malnutrition so  far. Continue supplementation.   Acute on chronic combined CHF. Echocardiogram currently 60-65%.  No wall motion abnormality.   Treated with IV diuresis. Currently on 3.125 mg Coreg, digoxin 0.125 mcg, Lasix 40 mg oral daily. Aldactone was on hold.  Resumed on 4/28.  Entresto on hold. Also on aspirin and Plavix. Appears euvolemic. Given ARF, hold next doses of diuretics. Give gentle IVF today   Anemia of chronic disease In the setting of osteomyelitis. H&H relatively stable.  Monitor.  No active bleeding.    Anxiety. Patient is on BuSpar 10 mg twice daily I will increase the dose to 15 mg 3 times daily. Already on Lyrica. Continue Atarax as needed.  ARF -Cr up to 1.82 -Hold next doses of diuretic -give gentle limited run of IVF -recheck bmet in AM   Subjective: Complains of continued post-op foot pains  Physical Exam: Vitals:   08/03/22 0829 08/03/22 0831 08/03/22 0832 08/03/22 1237  BP: 111/65   110/70  Pulse:   78 85  Resp:   (!) 21 19  Temp:  99.6 F (37.6 C)  99.9 F (37.7 C)  TempSrc:  Oral  Oral  SpO2:   93% 95%  Weight:      Height:       General exam: Awake, laying in bed, in nad Respiratory system: Normal respiratory effort, no wheezing Cardiovascular system: regular rate, s1, s2 Gastrointestinal system: Soft, nondistended, positive BS Central nervous system: CN2-12 grossly intact, strength intact Extremities: LE post-op dressings in place, no clubbing Skin: Normal skin turgor, no notable skin lesions seen Psychiatry: Mood normal // no visual hallucinations   Data Reviewed:  Labs reviewed: Na 127, K 4.7, Cr 1.82  Family Communication: Pt in room, family not at bedside  Disposition: Status is: Inpatient Remains inpatient appropriate because: Severity of illness  Planned Discharge Destination: Home    Author: Rickey Barbara, MD 08/03/2022 3:57 PM  For on call review [email or web address].

## 2022-08-03 NOTE — Progress Notes (Signed)
[facility]  Date of Admission:  07/12/2022     Total days of antibiotics 18         ASSESSMENT:  Mr. Joye is POD #1 from right 5th ray amputation and debridement of the right 5 metatarsal. Cultures obtained are growing Staphylococcus aureus which I would suspect would be similar to previously found MRSA. Reviewed recommendation of at least 1 more week of IV treatment and that he would likely have to stay inpatient for that week as he is not currently home health eligible unless SNF or rehabilitation would be available. Last vancomycin trough was therapeutic and renal function has remained stable. Continue current dose of vancomycin. Post-operative wound care per Orthopedics with remaining medical and supportive care per Internal Medicine.   PLAN:  Continue current dose of vancomycin.  Therapeutic drug monitoring of renal function and vancomycin levels.  Recommend at least another week of IV therapy prior to possible change to oral treatment with disseminated infection.  Post-operative wound care per Orthopedics. Remaining medical and supportive care per Internal Medicine.   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)   Pure hypercholesterolemia   Hyponatremia   Hypomagnesemia   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)   Sepsis due to pneumonia (HCC)   Bacteremia   Infective myositis   Severe protein-calorie malnutrition (HCC)   PVD (peripheral vascular disease) (HCC)   AKI (acute kidney injury) (HCC)   Discitis thoracic region   Bacteremia due to methicillin resistant Staphylococcus aureus    aspirin EC  81 mg Oral Daily   atorvastatin  80 mg Oral Daily   budesonide (PULMICORT) nebulizer solution   0.5 mg Nebulization BID   busPIRone  15 mg Oral TID   carvedilol  3.125 mg Oral BID   Chlorhexidine Gluconate Cloth  6 each Topical Daily   clopidogrel  75 mg Oral Q breakfast   digoxin  125 mcg Oral Daily   famotidine  20 mg Oral BID   feeding supplement (GLUCERNA SHAKE)  237 mL Oral TID BM   [START ON 08/05/2022] furosemide  40 mg Oral Daily   insulin aspart  0-20 Units Subcutaneous TID WC   [START ON 08/04/2022] insulin glargine-yfgn  30 Units Subcutaneous Daily   lidocaine  1 patch Transdermal Daily   magnesium oxide  400 mg Oral Daily   methocarbamol  500 mg Oral TID   mupirocin ointment  1 Application Nasal BID   pregabalin  50 mg Oral TID   senna-docusate  2 tablet Oral BID   sodium chloride flush  10-40 mL Intracatheter Q12H   [START ON 08/05/2022] spironolactone  25 mg Oral Daily    SUBJECTIVE:  Afebrile overnight with no acute events. Having chest pain and challenges getting comfortable. Shortness of breath at times with movement.   No Known Allergies   Review of Systems: Review of Systems  Constitutional:  Positive for malaise/fatigue. Negative for chills, fever and weight loss.  Respiratory:  Positive for shortness of breath. Negative for cough and wheezing.   Cardiovascular:  Positive for chest pain. Negative for leg swelling.  Gastrointestinal:  Negative for abdominal pain, constipation, diarrhea, nausea and vomiting.  Skin:  Negative for rash.      OBJECTIVE:  Vitals:   08/03/22 0829 08/03/22 0831 08/03/22 0832 08/03/22 1237  BP: 111/65   110/70  Pulse:   78 85  Resp:   (!) 21 19  Temp:  99.6 F (37.6 C)  99.9 F (37.7 C)  TempSrc:  Oral  Oral  SpO2:   93% 95%  Weight:      Height:       Body mass index is 24.1 kg/m.  Physical Exam Constitutional:      General: He is not in acute distress.    Appearance: He is well-developed.  Cardiovascular:     Rate and Rhythm: Normal rate and regular rhythm.     Heart sounds: Normal heart sounds.  Pulmonary:      Effort: Pulmonary effort is normal.     Breath sounds: Normal breath sounds.  Skin:    General: Skin is warm and dry.  Neurological:     Mental Status: He is alert and oriented to person, place, and time.  Psychiatric:        Behavior: Behavior normal.        Thought Content: Thought content normal.        Judgment: Judgment normal.     Lab Results Lab Results  Component Value Date   WBC 11.2 (H) 08/03/2022   HGB 9.1 (L) 08/03/2022   HCT 27.2 (L) 08/03/2022   MCV 90.4 08/03/2022   PLT 370 08/03/2022    Lab Results  Component Value Date   CREATININE 1.82 (H) 08/03/2022   BUN 21 (H) 08/03/2022   NA 127 (L) 08/03/2022   K 4.7 08/03/2022   CL 91 (L) 08/03/2022   CO2 26 08/03/2022    Lab Results  Component Value Date   ALT 27 08/03/2022   AST 20 08/03/2022   ALKPHOS 94 08/03/2022   BILITOT 0.9 08/03/2022     Microbiology: Recent Results (from the past 240 hour(s))  Culture, blood (Routine X 2) w Reflex to ID Panel     Status: Abnormal   Collection Time: 07/24/22  3:55 PM   Specimen: BLOOD LEFT ARM  Result Value Ref Range Status   Specimen Description   Final    BLOOD LEFT ARM Performed at [facility], [address] [place], [place] [address]    Special Requests   Final    AEROBIC BOTTLE ONLY Blood Culture adequate volume Performed at [facility], [address], [place], [place] [address]    Culture  Setup Time   Final    GRAM POSITIVE COCCI IN CLUSTERS AEROBIC BOTTLE ONLY CRITICAL RESULT CALLED TO, READ BACK BY AND VERIFIED WITH: PHARMD T. JOHNSON 07/26/22 @ 0016 BY AB Performed at [facility], [address], [place], [place] [address]    Culture METHICILLIN RESISTANT STAPHYLOCOCCUS AUREUS (A)  Final   Report Status 07/27/2022 FINAL  Final   Organism ID, Bacteria METHICILLIN RESISTANT STAPHYLOCOCCUS AUREUS  Final      Susceptibility   Methicillin resistant staphylococcus aureus - MIC*    CIPROFLOXACIN >=8 RESISTANT  Resistant     ERYTHROMYCIN >=8 RESISTANT Resistant     GENTAMICIN <=0.5 SENSITIVE Sensitive     OXACILLIN >=4 RESISTANT Resistant     TETRACYCLINE <=1 SENSITIVE Sensitive     VANCOMYCIN <=0.5 SENSITIVE Sensitive     TRIMETH/SULFA >=320 RESISTANT Resistant     CLINDAMYCIN <=0.25 SENSITIVE Sensitive     RIFAMPIN <=0.5 SENSITIVE Sensitive     Inducible Clindamycin NEGATIVE Sensitive     *  METHICILLIN RESISTANT STAPHYLOCOCCUS AUREUS  Culture, blood (Routine X 2) w Reflex to ID Panel     Status: Abnormal   Collection Time: 07/24/22  3:55 PM   Specimen: BLOOD  Result Value Ref Range Status   Specimen Description   Final    BLOOD BLOOD RIGHT ARM Performed at [facility], [address], [place], [place] [address]    Special Requests   Final    AEROBIC BOTTLE ONLY Blood Culture adequate volume Performed at [facility], [address], [place], [place] [address]    Culture  Setup Time   Final    GRAM POSITIVE COCCI IN CLUSTERS AEROBIC BOTTLE ONLY CRITICAL VALUE NOTED.  VALUE IS CONSISTENT WITH PREVIOUSLY REPORTED AND CALLED VALUE. CRITICAL RESULT CALLED TO, READ BACK BY AND VERIFIED WITH: PHARMD M.BELL AT 1315 ON 07/25/2022 BY T.SAAD.    Culture (A)  Final    STAPHYLOCOCCUS AUREUS SUSCEPTIBILITIES PERFORMED ON PREVIOUS CULTURE WITHIN THE LAST 5 DAYS. Performed at [facility], [address], [place], [place] [address]    Report Status 07/27/2022 FINAL  Final  Blood Culture ID Panel (Reflexed)     Status: Abnormal   Collection Time: 07/24/22  3:55 PM  Result Value Ref Range Status   Enterococcus faecalis NOT DETECTED NOT DETECTED Final   Enterococcus Faecium NOT DETECTED NOT DETECTED Final   Listeria monocytogenes NOT DETECTED NOT DETECTED Final   Staphylococcus species DETECTED (A) NOT DETECTED Final    Comment: CRITICAL RESULT CALLED TO, READ BACK BY AND VERIFIED WITH: PHARMD T. JOHNSON 07/26/22 @ 0016 BY AB    Staphylococcus aureus  (BCID) DETECTED (A) NOT DETECTED Final    Comment: Methicillin (oxacillin)-resistant Staphylococcus aureus (MRSA). MRSA is predictably resistant to beta-lactam antibiotics (except ceftaroline). Preferred therapy is vancomycin unless clinically contraindicated. Patient requires contact precautions if  hospitalized. CRITICAL RESULT CALLED TO, READ BACK BY AND VERIFIED WITH: PHARMD T. JOHNSON 07/26/22 @ 0016 BY AB    Staphylococcus epidermidis NOT DETECTED NOT DETECTED Final   Staphylococcus lugdunensis NOT DETECTED NOT DETECTED Final   Streptococcus species NOT DETECTED NOT DETECTED Final   Streptococcus agalactiae NOT DETECTED NOT DETECTED Final   Streptococcus pneumoniae NOT DETECTED NOT DETECTED Final   Streptococcus pyogenes NOT DETECTED NOT DETECTED Final   A.calcoaceticus-baumannii NOT DETECTED NOT DETECTED Final   Bacteroides fragilis NOT DETECTED NOT DETECTED Final   Enterobacterales NOT DETECTED NOT DETECTED Final   Enterobacter cloacae complex NOT DETECTED NOT DETECTED Final   Escherichia coli NOT DETECTED NOT DETECTED Final   Klebsiella aerogenes NOT DETECTED NOT DETECTED Final   Klebsiella oxytoca NOT DETECTED NOT DETECTED Final   Klebsiella pneumoniae NOT DETECTED NOT DETECTED Final   Proteus species NOT DETECTED NOT DETECTED Final   Salmonella species NOT DETECTED NOT DETECTED Final   Serratia marcescens NOT DETECTED NOT DETECTED Final   Haemophilus influenzae NOT DETECTED NOT DETECTED Final   Neisseria meningitidis NOT DETECTED NOT DETECTED Final   Pseudomonas aeruginosa NOT DETECTED NOT DETECTED Final   Stenotrophomonas maltophilia NOT DETECTED NOT DETECTED Final   Candida albicans NOT DETECTED NOT DETECTED Final   Candida auris NOT DETECTED NOT DETECTED Final   Candida glabrata NOT DETECTED NOT DETECTED Final   Candida krusei NOT DETECTED NOT DETECTED Final   Candida parapsilosis NOT DETECTED NOT DETECTED Final   Candida tropicalis NOT DETECTED NOT DETECTED Final    Cryptococcus neoformans/gattii NOT DETECTED NOT DETECTED Final   Meth resistant mecA/C and MREJ DETECTED (  A) NOT DETECTED Final    Comment: CRITICAL RESULT CALLED TO, READ BACK BY AND VERIFIED WITH: PHARMD T. JOHNSON 07/26/22 @ 0016 BY AB Performed at [facility], [address], [place], [place] [address]   Culture, blood (Routine X 2) w Reflex to ID Panel     Status: None   Collection Time: 07/27/22 12:15 PM   Specimen: BLOOD  Result Value Ref Range Status   Specimen Description BLOOD RIGHT ANTECUBITAL  Final   Special Requests   Final    BOTTLES DRAWN AEROBIC AND ANAEROBIC Blood Culture adequate volume   Culture   Final    NO GROWTH 5 DAYS Performed at [facility], [address], [place], [place] [address]    Report Status 08/01/2022 FINAL  Final  Culture, blood (Routine X 2) w Reflex to ID Panel     Status: None   Collection Time: 07/27/22 12:15 PM   Specimen: BLOOD  Result Value Ref Range Status   Specimen Description BLOOD RIGHT ANTECUBITAL  Final   Special Requests   Final    BOTTLES DRAWN AEROBIC AND ANAEROBIC Blood Culture adequate volume   Culture   Final    NO GROWTH 5 DAYS Performed at [facility], [address], [place], [place] [address]    Report Status 08/01/2022 FINAL  Final  Aerobic/Anaerobic Culture w Gram Stain (surgical/deep wound)     Status: None   Collection Time: 07/27/22  5:04 PM   Specimen: Abscess  Result Value Ref Range Status   Specimen Description ABSCESS CHEST RIGHT  Final   Special Requests DRAIN  Final   Gram Stain NO WBC SEEN NO ORGANISMS SEEN   Final   Culture   Final    No growth aerobically or anaerobically. Performed at [facility], [address], [place], [place] [address]    Report Status 08/01/2022 FINAL  Final  MRSA Next Gen by PCR, Nasal     Status: Abnormal   Collection Time: 08/01/22 11:18 PM   Specimen: Nasal Mucosa; Nasal Swab  Result Value Ref Range Status   MRSA by PCR Next Gen DETECTED (A)  NOT DETECTED Final    Comment: RESULTS CALLED TO, READ BACK BY AND VERIFIED WITH RN K.DAVIDSON ON 08/02/22 AT 0117 BY NM (NOTE) The GeneXpert MRSA Assay (FDA approved for NASAL specimens only), is one component of a comprehensive MRSA colonization surveillance program. It is not intended to diagnose MRSA infection nor to guide or monitor treatment for MRSA infections. Test performance is not FDA approved in patients less than 20 years old. Performed at [facility], [address], [place], [place] [address]   Aerobic/Anaerobic Culture w Gram Stain (surgical/deep wound)     Status: None (Preliminary result)   Collection Time: 08/02/22 12:16 PM   Specimen: PATH Bone biopsy; Tissue  Result Value Ref Range Status   Specimen Description BONE  Final   Special Requests NONE  Final   Gram Stain NO WBC SEEN RARE GRAM NEGATIVE COCCI IN PAIRS   Final   Culture   Final    FEW STAPHYLOCOCCUS AUREUS SUSCEPTIBILITIES TO FOLLOW Performed at [facility], [address], [place], [place] [address]    Report Status PENDING  Incomplete  Aerobic/Anaerobic Culture w Gram Stain (surgical/deep wound)     Status: None (Preliminary result)   Collection Time: 08/02/22 12:23 PM   Specimen: PATH Bone biopsy; Tissue  Result Value Ref Range  Status   Specimen Description WOUND  Final   Special Requests NONE  Final   Gram Stain NO WBC SEEN RARE GRAM POSITIVE COCCI   Final   Culture   Final    FEW STAPHYLOCOCCUS AUREUS SUSCEPTIBILITIES TO FOLLOW Performed at [facility], [address] [place], [place] [address]    Report Status PENDING  Incomplete     Marcos Eke, NP [facility]  08/03/2022  1:43 PM

## 2022-08-03 NOTE — Inpatient Diabetes Management (Signed)
Inpatient Diabetes Program Recommendations  AACE/ADA: New Consensus Statement on Inpatient Glycemic Control (2015)  Target Ranges:  Prepandial:   less than 140 mg/dL      Peak postprandial:   less than 180 mg/dL (1-2 hours)      Critically ill patients:  140 - 180 mg/dL   Lab Results  Component Value Date   GLUCAP 338 (H) 08/03/2022   HGBA1C 9.1 (H) 07/13/2022    Review of Glycemic Control  Latest Reference Range & Units 08/02/22 13:57 08/02/22 14:22 08/02/22 17:41 08/02/22 21:24 08/03/22 06:25  Glucose-Capillary 70 - 99 mg/dL 161 (H) 096 (H) 045 (H) 247 (H) 338 (H)  (H): Data is abnormally high Diabetes history: Type 2 DM Outpatient Diabetes medications: Basaglar 40 units QD, Humalog 15 units TID Current orders for Inpatient glycemic control: Semglee 24 units QD, Novolog 0-20 units TID  Inpatient Diabetes Program Recommendations:    Consider increasing Semglee to 30 units QD and adding Novolog 4 units TID (Assuming patient consuming >50% of meals).   Thanks, Lujean Rave, MSN, RNC-OB Diabetes Coordinator [phone] (8a-5p)

## 2022-08-03 NOTE — Progress Notes (Signed)
Pharmacy Antibiotic Note  David [name] is a 62 y.o. 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 Pharmacy has been consulted for Vancomycin dosing.    Cefazolin was added s/p 5th ray amputation and I&D on 08/02/22, with concern for polymicrobial infection, and high risk for further amputation. Operative cultures/bone biopsy growing Staph aureus, final report pending.  Creatinine has been in 1.1-1.2 range, but up to 1.82 today.   Vancomycin 1gm IV given ~8am, on schedule. Vanc levels and AUC were at goal on 4/26.  Plan: Vancomycin 1gm IV q12h changed to 1250 mg IV q24h > next dose 5/2 am. Will check random Vanc level tonight, ~12 hours after am dose, to assess clearance, and further adjust as needed. Estimated AUC on new regimen: 498 (goal 400-550) using SCr 1.82. Monitor renal function, culture data, clinical course, and antibiotic plans.  Height: 6' (182.9 cm) Weight: 80.6 kg (177 lb 11.1 oz) IBW/kg (Calculated) : 77.6  Temp (24hrs), Avg:98.9 F (37.2 C), Min:97.6 F (36.4 C), Max:101.6 F (38.7 C)  Recent Labs  Lab 07/29/22 0146 07/29/22 0830 07/30/22 0132 07/31/22 0107 07/31/22 1024 08/01/22 0131 08/03/22 0204  WBC 8.1  --  8.7 9.7  --  10.3 11.2*  CREATININE 1.21  --  1.10 1.11 1.12 1.20 1.82*  VANCOTROUGH  --  14*  --   --   --   --   --   VANCOPEAK 25*  --   --   --   --   --   --     Estimated Creatinine Clearance: 47.4 mL/min (A) (by C-G formula based on SCr of 1.82 mg/dL (H)).    No Known Allergies  Antimicrobials this admission: CTX/Azithro 4/9 >> 4/10 Vancomycin 4/10 >>4/12; 4/23 >>  Daptomycin 4/12 >> 4/15; 4/21>>422 Oritavancin x 1 on 4/15 Nystatin 4/15 >>4/30  Bactroban nasal 4/30>>(5/3) Cefazolin 4/30 >>  Dose adjustments this admission: 4/26 Vanc peak and trough levels > AUC on target (498) on 1gm IV q12h  Microbiology results: 4/9 blood: 4/4 MRSA (susc sent out to [facility] for Dapto) 4/9  respiratory panel: neg 4/11 blood: NGF 4/21 blood: 2/2 MRSA (S: gent, TCN, vanc (MIC <0.5), clinda, rifampin) 4/24 blood: neg 4/24 Empyema: neg 4/29 MRSA PCR: positive 4/30 bone (bone biopsy): few Staph aureus - sens pending 4/30 wound (bone biopsy): few Staph aureus - sens pending  Thank you for allowing pharmacy to be a part of this patient's care.  Dennie Fetters, [place] 08/03/2022 12:25 PM

## 2022-08-03 NOTE — Progress Notes (Signed)
Subjective: 1 Day Post-Op Procedure(s) (LRB): AMPUTATION RAY 5TH METATARSAL OF RIGHT FOOT (Right) IRRIGATION AND DEBRIDEMENT RIGHT 5TH METATARSAL FOOT BONE BIOPSY OF 5TH METATARSAL RIGHT FOOT (Right)  Patient reports pain as appropriately controlled. Denies any new numbness/tingling.   Objective:   VITALS:  Temp:  [97.6 F (36.4 C)-101.6 F (38.7 C)] 98.3 F (36.8 C) (05/01 0010) Pulse Rate:  [67-98] 98 (05/01 0010) Resp:  [13-20] 17 (05/01 0010) BP: (98-148)/(60-99) 120/84 (05/01 0010) SpO2:  [91 %-98 %] 91 % (05/01 0010) Weight:  [80.6 kg] 80.6 kg (04/30 1053)  Gen: AAOx3, NAD Comfortable at rest  Right Lower Extremity: Dressing c/d/I ADF/APF/EHL 5/5 SILT throughout DP, PT 2+ to palp CR < 2s    LABS Recent Labs    08/01/22 0131 08/03/22 0204  HGB 10.0* 9.1*  WBC 10.3 11.2*  PLT 336 370   Recent Labs    07/31/22 1024 08/01/22 0131  NA 129* 128*  K 3.6 3.9  CL 96* 97*  CO2 23 23  BUN 10 13  CREATININE 1.12 1.20  GLUCOSE 218* 274*   No results for input(s): "LABPT", "INR" in the last 72 hours.   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 Postop shoe when out of bed -daily dry dressing change by bedside nursing starting POD1 -DVT ppx: per primary team -Abx: per primary team (trend intra-op cx). Consider addition of gram negative coverage for potential polymicrobial infection given location of wound and high risk for further amputation (Ancef ordered) -follow up as outpatient within 7-10 days for wound check -sutures out in 3-4 weeks in outpatient office  [name] 08/03/2022, 3:24 AM

## 2022-08-03 NOTE — Progress Notes (Signed)
Pt refused to get his weight this AM due to pain in his right foot.  Will continue to monitor.  Harriet Masson, RN

## 2022-08-04 LAB — COMPREHENSIVE METABOLIC PANEL
ALT: 24 U/L (ref 0–44)
AST: 21 U/L (ref 15–41)
Albumin: 2 g/dL — ABNORMAL LOW (ref 3.5–5.0)
Alkaline Phosphatase: 114 U/L (ref 38–126)
Anion gap: 9 (ref 5–15)
BUN: 20 mg/dL (ref 6–20)
CO2: 26 mmol/L (ref 22–32)
Calcium: 8.9 mg/dL (ref 8.9–10.3)
Chloride: 90 mmol/L — ABNORMAL LOW (ref 98–111)
Creatinine, Ser: 1.63 mg/dL — ABNORMAL HIGH (ref 0.61–1.24)
GFR, Estimated: 48 mL/min — ABNORMAL LOW (ref >60)
Glucose, Bld: 354 mg/dL — ABNORMAL HIGH (ref 70–99)
Potassium: 4.2 mmol/L (ref 3.5–5.1)
Sodium: 125 mmol/L — ABNORMAL LOW (ref 135–145)
Total Bilirubin: 0.6 mg/dL (ref 0.3–1.2)
Total Protein: 7.3 g/dL (ref 6.5–8.1)

## 2022-08-04 LAB — CBC
HCT: 29.3 % — ABNORMAL LOW (ref 39.0–52.0)
Hemoglobin: 9.9 g/dL — ABNORMAL LOW (ref 13.0–17.0)
MCH: 29.8 pg (ref 26.0–34.0)
MCHC: 33.8 g/dL (ref 30.0–36.0)
MCV: 88.3 fL (ref 80.0–100.0)
Platelets: 402 10*3/uL — ABNORMAL HIGH (ref 150–400)
RBC: 3.32 MIL/uL — ABNORMAL LOW (ref 4.22–5.81)
RDW: 14.3 % (ref 11.5–15.5)
WBC: 10.9 10*3/uL — ABNORMAL HIGH (ref 4.0–10.5)
nRBC: 0 % (ref 0.0–0.2)

## 2022-08-04 LAB — AEROBIC/ANAEROBIC CULTURE W GRAM STAIN (SURGICAL/DEEP WOUND)

## 2022-08-04 LAB — MAGNESIUM: Magnesium: 1.6 mg/dL — ABNORMAL LOW (ref 1.7–2.4)

## 2022-08-04 LAB — GLUCOSE, CAPILLARY
Glucose-Capillary: 281 mg/dL — ABNORMAL HIGH (ref 70–99)
Glucose-Capillary: 249 mg/dL — ABNORMAL HIGH (ref 70–99)
Glucose-Capillary: 229 mg/dL — ABNORMAL HIGH (ref 70–99)
Glucose-Capillary: 211 mg/dL — ABNORMAL HIGH (ref 70–99)

## 2022-08-04 LAB — SURGICAL PATHOLOGY

## 2022-08-04 LAB — OSMOLALITY, URINE: Osmolality, Ur: 186 mOsm/kg — ABNORMAL LOW (ref 300–900)

## 2022-08-04 LAB — OSMOLALITY: Osmolality: 287 mOsm/kg (ref 275–295)

## 2022-08-04 LAB — SODIUM, URINE, RANDOM: Sodium, Ur: 45 mmol/L

## 2022-08-04 MED ORDER — MAGNESIUM SULFATE 4 GM/100ML IV SOLN
4.0000 g | Freq: Once | INTRAVENOUS | Status: AC
  Administered 2022-08-04: 4 g via INTRAVENOUS
  Filled 2022-08-04 (×2): qty 100

## 2022-08-04 MED ORDER — VANCOMYCIN HCL IN DEXTROSE 1-5 GM/200ML-% IV SOLN
1000.0000 mg | Freq: Two times a day (BID) | INTRAVENOUS | Status: DC
  Administered 2022-08-04 – 2022-08-10 (×13): 1000 mg via INTRAVENOUS
  Filled 2022-08-04 (×13): qty 200

## 2022-08-04 MED ORDER — INSULIN ASPART 100 UNIT/ML IJ SOLN
3.0000 [IU] | Freq: Three times a day (TID) | INTRAMUSCULAR | Status: DC
  Administered 2022-08-04 – 2022-08-15 (×24): 3 [IU] via SUBCUTANEOUS

## 2022-08-04 MED ORDER — MAGNESIUM OXIDE -MG SUPPLEMENT 400 (240 MG) MG PO TABS
400.0000 mg | ORAL_TABLET | Freq: Every day | ORAL | Status: DC
  Administered 2022-08-05 – 2022-08-12 (×8): 400 mg via ORAL
  Filled 2022-08-04 (×8): qty 1

## 2022-08-04 NOTE — Progress Notes (Signed)
Pharmacy Antibiotic Note  David [name] is a 62 y.o. 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 Pharmacy has been consulted for Vancomycin dosing.    Cefazolin was added s/p 5th ray amputation and I&D on 08/02/22, with concern for polymicrobial infection, and high risk for further amputation. Operative cultures/bone biopsy growing Staph aureus, final report pending.  Creatinine has been in 1.1-1.2 range, but up to 1.82 today.   Vancomycin 1gm IV given ~8am, on schedule. Vanc levels and AUC were at goal on 4/26.  5/2 AM update:  -Vancomycin level (essentially a trough given timing) was good at 13 despite increase in Scr -BMET was re-checked and Scr is back down some (1.82>>1.58) -Will change back to 1000 mg IV q12h for now and watch Scr closely   Plan: Change vancomycin back to 1000 mg IV q12h Watch Scr closely and adjust/check levels as needed  Height: 6' (182.9 cm) Weight: 80.6 kg (177 lb 11.1 oz) IBW/kg (Calculated) : 77.6  Temp (24hrs), Avg:98.9 F (37.2 C), Min:98.2 F (36.8 C), Max:99.9 F (37.7 C)  Recent Labs  Lab 07/29/22 0146 07/29/22 0830 07/30/22 0132 07/31/22 0107 07/31/22 1024 08/01/22 0131 08/03/22 0204 08/03/22 2021 08/03/22 2231  WBC 8.1  --  8.7 9.7  --  10.3 11.2*  --   --   CREATININE 1.21  --  1.10 1.11 1.12 1.20 1.82*  --  1.58*  VANCOTROUGH  --  14*  --   --   --   --   --   --   --   VANCOPEAK 25*  --   --   --   --   --   --   --   --   VANCORANDOM  --   --   --   --   --   --   --  13  --      Estimated Creatinine Clearance: 54.6 mL/min (A) (by C-G formula based on SCr of 1.58 mg/dL (H)).    No Known Allergies  Antimicrobials this admission: CTX/Azithro 4/9 >> 4/10 Vancomycin 4/10 >>4/12; 4/23 >>  Daptomycin 4/12 >> 4/15; 4/21>>422 Oritavancin x 1 on 4/15 Nystatin 4/15 >>4/30  Bactroban nasal 4/30>>(5/3) Cefazolin 4/30 >>  Dose adjustments this admission: 4/26 Vanc peak and trough  levels > AUC on target (498) on 1gm IV q12h  Microbiology results: 4/9 blood: 4/4 MRSA (susc sent out to [facility] for Dapto) 4/9 respiratory panel: neg 4/11 blood: NGF 4/21 blood: 2/2 MRSA (S: gent, TCN, vanc (MIC <0.5), clinda, rifampin) 4/24 blood: neg 4/24 Empyema: neg 4/29 MRSA PCR: positive 4/30 bone (bone biopsy): few Staph aureus - sens pending 4/30 wound (bone biopsy): few Staph aureus - sens pending  Abran Duke, PharmD, BCPS Clinical Pharmacist Phone: [phone]

## 2022-08-04 NOTE — Plan of Care (Signed)
Problem: Education: Goal: Knowledge of disease or condition will improve Outcome: Progressing Goal: Knowledge of the prescribed therapeutic regimen will improve Outcome: Progressing Goal: Individualized Educational Video(s) Outcome: Progressing   Problem: Activity: Goal: Ability to tolerate increased activity will improve Outcome: Progressing Goal: Will verbalize the importance of balancing activity with adequate rest periods Outcome: Progressing   Problem: Respiratory: Goal: Ability to maintain a clear airway will improve Outcome: Progressing Goal: Levels of oxygenation will improve Outcome: Progressing Goal: Ability to maintain adequate ventilation will improve Outcome: Progressing   Problem: Activity: Goal: Ability to tolerate increased activity will improve Outcome: Progressing   Problem: Clinical Measurements: Goal: Ability to maintain a body temperature in the normal range will improve Outcome: Progressing   Problem: Respiratory: Goal: Ability to maintain adequate ventilation will improve Outcome: Progressing Goal: Ability to maintain a clear airway will improve Outcome: Progressing   Problem: Education: Goal: Ability to describe self-care measures that may prevent or decrease complications (Diabetes Survival Skills Education) will improve Outcome: Progressing Goal: Individualized Educational Video(s) Outcome: Progressing   Problem: Coping: Goal: Ability to adjust to condition or change in health will improve Outcome: Progressing   Problem: Fluid Volume: Goal: Ability to maintain a balanced intake and output will improve Outcome: Progressing   Problem: Health Behavior/Discharge Planning: Goal: Ability to identify and utilize available resources and services will improve Outcome: Progressing Goal: Ability to manage health-related needs will improve Outcome: Progressing   Problem: Metabolic: Goal: Ability to maintain appropriate glucose levels will  improve Outcome: Progressing   Problem: Nutritional: Goal: Maintenance of adequate nutrition will improve Outcome: Progressing Goal: Progress toward achieving an optimal weight will improve Outcome: Progressing   Problem: Skin Integrity: Goal: Risk for impaired skin integrity will decrease Outcome: Progressing   Problem: Tissue Perfusion: Goal: Adequacy of tissue perfusion will improve Outcome: Progressing   Problem: Education: Goal: Knowledge of General Education information will improve Description: Including pain rating scale, medication(s)/side effects and non-pharmacologic comfort measures Outcome: Progressing   Problem: Health Behavior/Discharge Planning: Goal: Ability to manage health-related needs will improve Outcome: Progressing   Problem: Clinical Measurements: Goal: Ability to maintain clinical measurements within normal limits will improve Outcome: Progressing Goal: Will remain free from infection Outcome: Progressing Goal: Diagnostic test results will improve Outcome: Progressing Goal: Respiratory complications will improve Outcome: Progressing Goal: Cardiovascular complication will be avoided Outcome: Progressing   Problem: Activity: Goal: Risk for activity intolerance will decrease Outcome: Progressing   Problem: Nutrition: Goal: Adequate nutrition will be maintained Outcome: Progressing   Problem: Coping: Goal: Level of anxiety will decrease Outcome: Progressing   Problem: Elimination: Goal: Will not experience complications related to bowel motility Outcome: Progressing Goal: Will not experience complications related to urinary retention Outcome: Progressing   Problem: Pain Managment: Goal: General experience of comfort will improve Outcome: Progressing   Problem: Safety: Goal: Ability to remain free from injury will improve Outcome: Progressing   Problem: Skin Integrity: Goal: Risk for impaired skin integrity will decrease Outcome:  Progressing   Problem: Education: Goal: Understanding of CV disease, CV risk reduction, and recovery process will improve Outcome: Progressing Goal: Individualized Educational Video(s) Outcome: Progressing   Problem: Activity: Goal: Ability to return to baseline activity level will improve Outcome: Progressing   Problem: Cardiovascular: Goal: Ability to achieve and maintain adequate cardiovascular perfusion will improve Outcome: Progressing Goal: Vascular access site(s) Level 0-1 will be maintained Outcome: Progressing   Problem: Health Behavior/Discharge Planning: Goal: Ability to safely manage health-related needs after discharge will improve Outcome: Progressing

## 2022-08-04 NOTE — Anesthesia Postprocedure Evaluation (Signed)
Anesthesia Post Note  Patient: David [name]  Procedure(s) Performed: AMPUTATION RAY 5TH METATARSAL OF RIGHT FOOT (Right: Foot) IRRIGATION AND DEBRIDEMENT RIGHT 5TH METATARSAL FOOT (Foot) BONE BIOPSY OF 5TH METATARSAL RIGHT FOOT (Right: Foot)     Patient location during evaluation: PACU Anesthesia Type: General Level of consciousness: awake and patient cooperative Pain management: pain level controlled Vital Signs Assessment: post-procedure vital signs reviewed and stable Respiratory status: spontaneous breathing, nonlabored ventilation, respiratory function stable and patient connected to nasal cannula oxygen Cardiovascular status: stable Postop Assessment: no apparent nausea or vomiting Anesthetic complications: no   No notable events documented.  Last Vitals:  Vitals:   08/04/22 1259 08/04/22 1616  BP: 104/61 118/82  Pulse: 74 78  Resp: 19 15  Temp: 36.8 C 36.9 C  SpO2: 94% 97%    Last Pain:  Vitals:   08/04/22 1826  TempSrc:   PainSc: 8                  Mishael Haran

## 2022-08-04 NOTE — Progress Notes (Signed)
Progress Note   Patient: David [name] NWG:[id number] DOB: March 13, 1961 DOA: 07/12/2022     23 DOS: the patient was seen and examined on 08/04/2022   Brief hospital course: PMH of chronic combined CHF, chronic A-fib on Eliquis, type II DM, prior amputation of right foot, HTN.  Present to the hospital with complaints of fever.  Found to have MRSA bacteremia as well as multiple abscesses.  Originally admitted at [facility] on 4/9.  Blood cultures came positive for MRSA 4/10 ID was consulted. 4/12 TTE was negative, TEE was done which was negative for vegetation 4/18 nephrology consulted for AKI. 4/22 neurosurgery [facility]/ Dr Yetta Barre were consulted. Recommend medical management with IV antibiotics only without any intervention. Neurosurgery was consulted and recommend conservative measures. 4/23 Dr. [name] was consulted for amputation.  Vascular surgery was consulted for concerns for PVD. 4/24 IR placed right-sided chest tube for pleural effusion 4/25 underwent right below-knee popliteal artery, tibioperoneal trunk and peroneal angioplasty with Dr. Chestine Spore. 4/26 IR remove the chest tube.  Follow-up chest x-ray still shows pleural effusions. 4/27 patient currently does not want to perform the procedure with Dr. [name].  Informed Dr. [name] as well as consulted Dr. Linna Caprice. 4/29: Patient agreeable to proceed with amputation surgery with orthopedic surgeon Dr. Odis Hollingshead 4/30: Patient underwent right fifth ray revision amputation.  Assessment and Plan: Sepsis secondary to MRSA bacteremia. Meeting SIRS criteria on admission with tachycardia and tachypnea. Also with hyperglycemia and leukocytosis. Blood cultures grew MRSA.  This is complicated by infection spread in multiple body areas. ID was consulted.  Repeat blood cultures negative initially. PICC line was placed on 4/13. Echocardiogram and TEE negative for vegetation. Due to reoccurrence of the fever had another culture drawn on 4/21 which was  positive for MRSA again. Repeat culture on 4/24 negative so far for any new growth. PICC line removed on 4/25. Per ID, continue current dose of vanc   Bilateral multifocal pneumonia seen on x-ray on 4/17 Right-sided loculated possible parapneumonic effusion. Patient had a chest x-ray at the time of admission which was negative for any acute abnormality. On a follow-up chest x-ray developed left lower lobe abnormalities concerning for pneumonia and after that bilateral concerning infiltrates. Spinal infiltrate showed large right pleural effusion with thick enhancing ring concerning for empyema. Reportedly pulmonary was consulted and recommended IR guided chest tube placement. Chest tube was placed on 4/24. 200 cc of blood-tinged fluid was aspirated. Cultures negative from the area. IR decided to remove the chest tube on 4/26.  Follow-up chest x-ray on 4/26 shows persistent loculated right pleural effusion with atelectasis. Repeat two-view chest x-ray shows persistent right pleural effusion unchanged after chest tube removal and significantly better compared to the one before chest tube placement. ESR elevated CRP lower. For now we will monitor but if he continues to have chest pain May require formal evaluation by pulmonary and further workup of his effusion.   History of amputation of fifth and fourth toe Acute osteomyelitis of residual fifth metatarsal diaphysis with soft tissue infection with sinus tract Orthopedic consulted. Recommendation is for amputation Patient wants to switch doctors from Dr. [name] but currently agreeable for performing amputation. Patient has seen by podiatry in the past at [facility] Dr. Romualdo Bolk and has gone through multiple excisional surgeries with them. 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 Thoracic and lumbar MRI spine on 4/10 did not show any evidence of active infection. On repeat MRI on 4/22 due to ongoing back pain there were evidence of osteomyelitis discitis as well as abscess. Lumbar spine shows no evidence of discitis osteomyelitis Neurosurgery Dr. Yetta Barre was consulted, recommended no surgical intervention. Will require repeat MRI.  Probably in 1 week~4/29, ID recs to cont current vanc.   Duodenitis  seen on admission CT abdomen. Currently no abdominal symptoms. Monitor.   Pain control. Patient reports ongoing pain control issues. Currently on IV Dilaudid as well as oxycodone. Also scheduled Robaxin.  Lyrica on 4/26, dose increased on 4/27 Monitor.   Peripheral vascular disease with critical limb ischemia. Vascular surgery was consulted by Dr. [name] Underwent angiogram 4/25 with angioplasty of right below-knee popliteal artery, tibioperoneal trunk, and peroneal artery   Recommendation for aspirin Plavix and statin.   Hypervolemic hyponatremia. Sodium level trending down Nephrology was consulted.  Treated with tolvaptan without any response.  Later on responded well to diuresis. Sodium trending down again. Pt admits to drinking ample amounts of free water Will fluid restrict Recheck bmet in AM   AKI on CKD 3A. Baseline 1.1.  On admission 1.4. Currently serum creatinine stable. Monitor.   Hypomagnesemia: - Current magnesium of 1.6 was replenished with 2 g of magnesium sulfate.     HTN. Blood pressure stable. Currently continuing current regimen.   Oral thrush. Nystatin. Resolved.   Type of diabetes mellitus uncontrolled with hyperglycemia with long-term insulin use. Hemoglobin A1c 9.1 Currently on 20 units of Semglee and sliding scale insulin. Dose adjusted.  Change Ensure to Glucerna. Monitor.   Chronic A-fib. Currently rate controlled. Eliquis on hold.  Currently switching to Lovenox.   Adult failure to thrive. No evidence of protein-calorie malnutrition so far. Continue supplementation.   Acute on chronic combined CHF. Echocardiogram currently 60-65%.  No wall motion abnormality.   Currently on 3.125 mg Coreg, digoxin 0.125 mcg, Lasix 40 mg oral daily. Aldactone was on hold.  Resumed on 4/28.  Entresto on hold. Also on aspirin and Plavix. Given ARF, hold next doses of diuretics. Give gentle IVF today   Anemia of chronic disease In the setting of osteomyelitis. H&H relatively stable.  Monitor.  No active bleeding.    Anxiety. Patient is on BuSpar 10 mg twice daily I will increase the dose to 15 mg 3 times daily. Already on Lyrica. Continue Atarax as needed.  ARF -Cr up to 1.82 -Hold next doses of diuretic -Cr 1.63 today   Subjective: Reports voiding very well  Physical Exam: Vitals:   08/04/22 0746 08/04/22 0822 08/04/22 1259 08/04/22 1616  BP: (!) 144/94  104/61 118/82  Pulse: 76  74 78  Resp: 19  19 15   Temp: 98 F (36.7 C)  98.3 F (36.8 C) 98.4 F (36.9 C)  TempSrc: Oral  Oral Oral  SpO2: 97% 96% 94% 97%  Weight:      Height:       General exam: Conversant, in no acute distress Respiratory system: normal chest rise, clear, no audible wheezing Cardiovascular system: regular rhythm, s1-s2 Gastrointestinal system: Nondistended, nontender, pos BS Central nervous system: No seizures, no tremors Extremities: No cyanosis, no joint deformities Skin: No rashes, no pallor Psychiatry: Affect normal // no auditory hallucinations   Data Reviewed:  Labs reviewed: Na 125, K 4.2, Cr 1.63, Hgb 9.9  Family Communication: Pt in room, family  not at bedside  Disposition: Status is: Inpatient Remains inpatient appropriate because: Severity of illness  Planned Discharge Destination: Home    Author: Rickey Barbara, MD 08/04/2022 6:36 PM  For on call review [email or web address].

## 2022-08-05 LAB — CBC
HCT: 27.3 % — ABNORMAL LOW (ref 39.0–52.0)
Hemoglobin: 9.3 g/dL — ABNORMAL LOW (ref 13.0–17.0)
MCH: 30.1 pg (ref 26.0–34.0)
MCHC: 34.1 g/dL (ref 30.0–36.0)
MCV: 88.3 fL (ref 80.0–100.0)
Platelets: 377 10*3/uL (ref 150–400)
RBC: 3.09 MIL/uL — ABNORMAL LOW (ref 4.22–5.81)
RDW: 14.2 % (ref 11.5–15.5)
WBC: 10.7 10*3/uL — ABNORMAL HIGH (ref 4.0–10.5)
nRBC: 0 % (ref 0.0–0.2)

## 2022-08-05 LAB — GLUCOSE, CAPILLARY
Glucose-Capillary: 295 mg/dL — ABNORMAL HIGH (ref 70–99)
Glucose-Capillary: 235 mg/dL — ABNORMAL HIGH (ref 70–99)
Glucose-Capillary: 185 mg/dL — ABNORMAL HIGH (ref 70–99)
Glucose-Capillary: 226 mg/dL — ABNORMAL HIGH (ref 70–99)

## 2022-08-05 LAB — COMPREHENSIVE METABOLIC PANEL
ALT: 22 U/L (ref 0–44)
AST: 25 U/L (ref 15–41)
Albumin: 1.9 g/dL — ABNORMAL LOW (ref 3.5–5.0)
Alkaline Phosphatase: 103 U/L (ref 38–126)
Anion gap: 10 (ref 5–15)
BUN: 17 mg/dL (ref 6–20)
CO2: 25 mmol/L (ref 22–32)
Calcium: 9 mg/dL (ref 8.9–10.3)
Chloride: 93 mmol/L — ABNORMAL LOW (ref 98–111)
Creatinine, Ser: 1.39 mg/dL — ABNORMAL HIGH (ref 0.61–1.24)
GFR, Estimated: 58 mL/min — ABNORMAL LOW (ref >60)
Glucose, Bld: 263 mg/dL — ABNORMAL HIGH (ref 70–99)
Potassium: 4.4 mmol/L (ref 3.5–5.1)
Sodium: 128 mmol/L — ABNORMAL LOW (ref 135–145)
Total Bilirubin: 0.7 mg/dL (ref 0.3–1.2)
Total Protein: 6.9 g/dL (ref 6.5–8.1)

## 2022-08-05 LAB — AEROBIC/ANAEROBIC CULTURE W GRAM STAIN (SURGICAL/DEEP WOUND): Gram Stain: NONE SEEN

## 2022-08-05 MED ORDER — DOCUSATE SODIUM 100 MG PO CAPS
100.0000 mg | ORAL_CAPSULE | Freq: Two times a day (BID) | ORAL | Status: DC
  Administered 2022-08-05 – 2022-08-10 (×11): 100 mg via ORAL
  Filled 2022-08-05 (×11): qty 1

## 2022-08-05 MED ORDER — POLYETHYLENE GLYCOL 3350 17 G PO PACK
17.0000 g | PACK | Freq: Every day | ORAL | Status: DC
  Administered 2022-08-05 – 2022-08-09 (×3): 17 g via ORAL
  Filled 2022-08-05 (×2): qty 1

## 2022-08-05 MED ORDER — ALBUMIN HUMAN 25 % IV SOLN
25.0000 g | Freq: Once | INTRAVENOUS | Status: AC
  Administered 2022-08-05: 25 g via INTRAVENOUS
  Filled 2022-08-05: qty 100

## 2022-08-05 MED ORDER — APIXABAN 5 MG PO TABS
5.0000 mg | ORAL_TABLET | Freq: Two times a day (BID) | ORAL | Status: DC
  Administered 2022-08-05 – 2022-08-19 (×29): 5 mg via ORAL
  Filled 2022-08-05 (×29): qty 1

## 2022-08-05 MED ORDER — SORBITOL 70 % SOLN
960.0000 mL | TOPICAL_OIL | Freq: Once | ORAL | Status: AC
  Administered 2022-08-05: 960 mL via RECTAL
  Filled 2022-08-05: qty 240

## 2022-08-05 NOTE — Progress Notes (Signed)
ANTICOAGULATION CONSULT NOTE - Initial Consult  Pharmacy Consult for apixaban Indication: atrial fibrillation  No Known Allergies  Patient Measurements: Height: 6' (182.9 cm) Weight: 82.5 kg (181 lb 14.1 oz) IBW/kg (Calculated) : 77.6   Vital Signs: Temp: 97.9 F (36.6 C) (05/03 1122) Temp Source: Oral (05/03 1122) BP: 124/93 (05/03 0744) Pulse Rate: 83 (05/03 0744)  Labs: Recent Labs    08/03/22 0204 08/03/22 2231 08/04/22 0220 08/05/22 0157  HGB 9.1*  --  9.9* 9.3*  HCT 27.2*  --  29.3* 27.3*  PLT 370  --  402* 377  CREATININE 1.82* 1.58* 1.63* 1.39*    Estimated Creatinine Clearance: 62 mL/min (A) (by C-G formula based on SCr of 1.39 mg/dL (H)).   Medical History: Past Medical History:  Diagnosis Date   Cardiomyopathy (HCC)    a. EF 45% in 2019.   CHF (congestive heart failure) (HCC)    Chronic anticoagulation 09/30/2017   Diabetes mellitus type 2 in nonobese [facility])    Does not have health insurance    Essential hypertension 09/26/2017   Financial difficulties    H/O noncompliance with medical treatment, presenting hazards to health    Non-insulin treated type 2 diabetes mellitus (HCC) 09/26/2017   Persistent atrial fibrillation (HCC)    Sleep apnea suspected 09/30/2017      Assessment: [name] with recurrent MRSA bacteremia, RLE critical limb ischemia with acute osteo of R 5th metatarsal to proximal metaphysis, T8-T9 discitis/osteo; T7-T9 prevertebral abscess now s/p 5th ray amputation and I&D. Patient's home apixaban for afib was held for work up and procedures. Pharmacy consulted to restart apixaban.  Noted patient has been started on aspirin and clopidogrel for RLE critical limb ischemia. Discussed with [facility] who said that clopidorel can be stopped given no stent was placed..    Goal of Therapy:  Monitor platelets by anticoagulation protocol: Yes   Plan: Resume home apixaban 5mg  BID Continue aspirin Stop clopidogrel Monitor for signs/symptoms of  bleeding   Alphia Moh, PharmD, BCPS, BCCP Clinical Pharmacist  Please check AMION for all [facility] Pharmacy phone numbers After 10:00 PM, call Main Pharmacy [phone]

## 2022-08-05 NOTE — Progress Notes (Signed)
Progress Note   Patient: David [name] WUJ:[id number] DOB: September 08, 1960 DOA: 07/12/2022     24 DOS: the patient was seen and examined on 08/05/2022   Brief hospital course: PMH of chronic combined CHF, chronic A-fib on Eliquis, type II DM, prior amputation of right foot, HTN.  Present to the hospital with complaints of fever.  Found to have MRSA bacteremia as well as multiple abscesses.  Originally admitted at [facility] on 4/9.  Blood cultures came positive for MRSA 4/10 ID was consulted. 4/12 TTE was negative, TEE was done which was negative for vegetation 4/18 nephrology consulted for AKI. 4/22 neurosurgery APP [facility]/ Dr Yetta Barre were consulted. Recommend medical management with IV antibiotics only without any intervention. Neurosurgery was consulted and recommend conservative measures. 4/23 Dr. [name] was consulted for amputation.  Vascular surgery was consulted for concerns for PVD. 4/24 IR placed right-sided chest tube for pleural effusion 4/25 underwent right below-knee popliteal artery, tibioperoneal trunk and peroneal angioplasty with Dr. Chestine Spore. 4/26 IR remove the chest tube.  Follow-up chest x-ray still shows pleural effusions. 4/27 patient currently does not want to perform the procedure with Dr. [name].  Informed Dr. [name] as well as consulted Dr. Linna Caprice. 4/29: Patient agreeable to proceed with amputation surgery with orthopedic surgeon Dr. Odis Hollingshead 4/30: Patient underwent right fifth ray revision amputation.  Assessment and Plan: Sepsis secondary to MRSA bacteremia. Meeting SIRS criteria on admission with tachycardia and tachypnea. Also with hyperglycemia and leukocytosis. Blood cultures grew MRSA.  This is complicated by infection spread in multiple body areas. ID was consulted.  Repeat blood cultures negative initially. PICC line was placed on 4/13. Echocardiogram and TEE negative for vegetation. Due to reoccurrence of the fever had another culture drawn on 4/21 which was  positive for MRSA again. Repeat culture on 4/24 negative so far for any new growth. PICC line removed on 4/25. Per ID, continue current dose of vanc   Bilateral multifocal pneumonia seen on x-ray on 4/17 Right-sided loculated possible parapneumonic effusion. Patient had a chest x-ray at the time of admission which was negative for any acute abnormality. On a follow-up chest x-ray developed left lower lobe abnormalities concerning for pneumonia and after that bilateral concerning infiltrates. Spinal infiltrate showed large right pleural effusion with thick enhancing ring concerning for empyema. Reportedly pulmonary was consulted and recommended IR guided chest tube placement. Chest tube was placed on 4/24. 200 cc of blood-tinged fluid was aspirated. Cultures negative from the area. IR decided to remove the chest tube on 4/26.  Follow-up chest x-ray on 4/26 shows persistent loculated right pleural effusion with atelectasis. Repeat two-view chest x-ray shows persistent right pleural effusion unchanged after chest tube removal and significantly better compared to the one before chest tube placement. ESR elevated CRP lower. 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 Recommendation is for amputation Patient wants to switch doctors from Dr. [name] but currently agreeable for performing amputation. Patient has seen by podiatry in the past at [facility] Dr. Romualdo Bolk and has gone through multiple excisional surgeries with them. 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 Thoracic and lumbar MRI  spine on 4/10  did not show any evidence of active infection. On repeat MRI on 4/22 due to ongoing back pain there were evidence of osteomyelitis discitis as well as abscess. Lumbar spine shows no evidence of discitis osteomyelitis Neurosurgery Dr. Yetta Barre was consulted, recommended no surgical intervention. ID recs to cont current vanc, per consult note on 4/22 Per ID, recommendation for at least another week of IV therapy prior to possible change to oral treatment   Duodenitis  seen on admission CT abdomen. Currently no abdominal symptoms. Monitor.   Pain control. Patient reports ongoing pain control issues. Currently on IV Dilaudid as well as oxycodone. Also scheduled Robaxin.  Lyrica on 4/26, dose increased on 4/27 Monitor.   Peripheral vascular disease with critical limb ischemia. Vascular surgery was consulted by Dr. [name] Underwent angiogram 4/25 with angioplasty of right below-knee popliteal artery, tibioperoneal trunk, and peroneal artery   Recommendation for aspirin Plavix and statin.   Hypervolemic hyponatremia. Sodium level trending down Nephrology was consulted.  Treated with tolvaptan without any response.  Later on responded well to diuresis. Sodium trending down again. Pt admits to drinking ample amounts of free water Fluid restricting, continued diuretic   AKI on CKD 3A. Baseline 1.1.  On admission 1.4. Currently serum creatinine stable. Monitor.   Hypomagnesemia: - Current magnesium of 1.6 was replenished with 2 g of magnesium sulfate.     HTN. Blood pressure stable. Currently continuing current regimen.   Oral thrush. Nystatin. Resolved.   Type of diabetes mellitus uncontrolled with hyperglycemia with long-term insulin use. Hemoglobin A1c 9.1 Currently on 20 units of Semglee and sliding scale insulin. Dose adjusted.  Change Ensure to Glucerna. Monitor.   Chronic A-fib. Currently rate controlled. Eliquis on hold. Currently switching to Lovenox.    Adult failure to thrive. No evidence of protein-calorie malnutrition so far. Continue supplementation.   Acute on chronic combined CHF. Echocardiogram currently 60-65%.  No wall motion abnormality.   Currently on 3.125 mg Coreg, digoxin 0.125 mcg, Lasix 40 mg oral daily. Aldactone was on hold.  Resumed on 4/28.  Entresto on hold. Also on aspirin and Plavix. Continued diuretic per above   Anemia of chronic disease In the setting of osteomyelitis. H&H relatively stable.  Monitor.  No active bleeding.    Anxiety. Patient is on BuSpar 10 mg twice daily I will increase the dose to 15 mg 3 times daily. Already on Lyrica. Continue Atarax as needed.  ARF -Cr up to 1.82 -Hold next doses of diuretic -Cr 1.39 today   Subjective: Very agitated about financial issues  Physical Exam: Vitals:   08/05/22 0300 08/05/22 0744 08/05/22 1122 08/05/22 1602  BP: 134/86 (!) 124/93  122/82  Pulse: 85 83  81  Resp: 18 18  20   Temp: 98.4 F (36.9 C) (!) 97.3 F (36.3 C) 97.9 F (36.6 C) 98 F (36.7 C)  TempSrc: Oral Oral Oral Oral  SpO2: 97% 93%  96%  Weight:      Height:       General exam: Awake, laying in bed, in nad Respiratory system: Normal respiratory effort, no wheezing Cardiovascular system: regular rate, s1, s2 Gastrointestinal system: Soft, nondistended, positive BS Central nervous system: CN2-12 grossly intact, strength intact Extremities: Perfused, no clubbing Skin: Normal skin turgor, no notable skin lesions seen Psychiatry: Mood normal // no visual hallucinations   Data Reviewed:  Labs reviewed: Na 128, K 4.4, Cr 1.39, Hgb 9.3  Family Communication: Pt in room, family not at bedside  Disposition: Status is: Inpatient  Remains inpatient appropriate because: Severity of illness  Planned Discharge Destination: Home    Author: Rickey Barbara, MD 08/05/2022 4:38 PM  For on call review [email or web address].

## 2022-08-06 LAB — COMPREHENSIVE METABOLIC PANEL
ALT: 21 U/L (ref 0–44)
AST: 28 U/L (ref 15–41)
Albumin: 2 g/dL — ABNORMAL LOW (ref 3.5–5.0)
Alkaline Phosphatase: 106 U/L (ref 38–126)
Anion gap: 12 (ref 5–15)
BUN: 17 mg/dL (ref 6–20)
CO2: 22 mmol/L (ref 22–32)
Calcium: 9 mg/dL (ref 8.9–10.3)
Chloride: 92 mmol/L — ABNORMAL LOW (ref 98–111)
Creatinine, Ser: 1.24 mg/dL (ref 0.61–1.24)
GFR, Estimated: >60 mL/min (ref >60)
Glucose, Bld: 206 mg/dL — ABNORMAL HIGH (ref 70–99)
Potassium: 3.8 mmol/L (ref 3.5–5.1)
Sodium: 126 mmol/L — ABNORMAL LOW (ref 135–145)
Total Bilirubin: 0.5 mg/dL (ref 0.3–1.2)
Total Protein: 6.8 g/dL (ref 6.5–8.1)

## 2022-08-06 LAB — CBC
HCT: 26 % — ABNORMAL LOW (ref 39.0–52.0)
Hemoglobin: 9.1 g/dL — ABNORMAL LOW (ref 13.0–17.0)
MCH: 30.4 pg (ref 26.0–34.0)
MCHC: 35 g/dL (ref 30.0–36.0)
MCV: 87 fL (ref 80.0–100.0)
Platelets: 374 10*3/uL (ref 150–400)
RBC: 2.99 MIL/uL — ABNORMAL LOW (ref 4.22–5.81)
RDW: 14.1 % (ref 11.5–15.5)
WBC: 11.3 10*3/uL — ABNORMAL HIGH (ref 4.0–10.5)
nRBC: 0 % (ref 0.0–0.2)

## 2022-08-06 LAB — GLUCOSE, CAPILLARY
Glucose-Capillary: 159 mg/dL — ABNORMAL HIGH (ref 70–99)
Glucose-Capillary: 199 mg/dL — ABNORMAL HIGH (ref 70–99)
Glucose-Capillary: 172 mg/dL — ABNORMAL HIGH (ref 70–99)
Glucose-Capillary: 232 mg/dL — ABNORMAL HIGH (ref 70–99)

## 2022-08-06 NOTE — Progress Notes (Signed)
Progress Note   Patient: David [name] WUJ:[id number] DOB: 11-Jan-1961 DOA: 07/12/2022     25 DOS: the patient was seen and examined on 08/06/2022   Brief hospital course: PMH of chronic combined CHF, chronic A-fib on Eliquis, type II DM, prior amputation of right foot, HTN.  Present to the hospital with complaints of fever.  Found to have MRSA bacteremia as well as multiple abscesses.  Originally admitted at [facility] on 4/9.  Blood cultures came positive for MRSA 4/10 ID was consulted. 4/12 TTE was negative, TEE was done which was negative for vegetation 4/18 nephrology consulted for AKI. 4/22 neurosurgery APP [facility]/ Dr Yetta Barre were consulted. Recommend medical management with IV antibiotics only without any intervention. Neurosurgery was consulted and recommend conservative measures. 4/23 Dr. [name] was consulted for amputation.  Vascular surgery was consulted for concerns for PVD. 4/24 IR placed right-sided chest tube for pleural effusion 4/25 underwent right below-knee popliteal artery, tibioperoneal trunk and peroneal angioplasty with Dr. Chestine Spore. 4/26 IR remove the chest tube.  Follow-up chest x-ray still shows pleural effusions. 4/27 patient currently does not want to perform the procedure with Dr. [name].  Informed Dr. [name] as well as consulted Dr. Linna Caprice. 4/29: Patient agreeable to proceed with amputation surgery with orthopedic surgeon Dr. Odis Hollingshead 4/30: Patient underwent right fifth ray revision amputation.  Assessment and Plan: Sepsis secondary to MRSA bacteremia. Meeting SIRS criteria on admission with tachycardia and tachypnea. Also with hyperglycemia and leukocytosis. Blood cultures grew MRSA.  This is complicated by infection spread in multiple body areas. ID was consulted.  Repeat blood cultures negative initially. PICC line was placed on 4/13. Echocardiogram and TEE negative for vegetation. Due to reoccurrence of the fever had another culture drawn on 4/21 which was  positive for MRSA again. Repeat culture on 4/24 negative so far for any new growth. PICC line removed on 4/25. Per ID, continue current dose of vanc   Bilateral multifocal pneumonia seen on x-ray on 4/17 Right-sided loculated possible parapneumonic effusion. Patient had a chest x-ray at the time of admission which was negative for any acute abnormality. On a follow-up chest x-ray developed left lower lobe abnormalities concerning for pneumonia and after that bilateral concerning infiltrates. Spinal infiltrate showed large right pleural effusion with thick enhancing ring concerning for empyema. Reportedly pulmonary was consulted and recommended IR guided chest tube placement. Chest tube was placed on 4/24. 200 cc of blood-tinged fluid was aspirated. Cultures negative from the area. IR decided to remove the chest tube on 4/26.  Follow-up chest x-ray on 4/26 shows persistent loculated right pleural effusion with atelectasis. Repeat two-view chest x-ray shows persistent right pleural effusion unchanged after chest tube removal and significantly better compared to the one before chest tube placement. 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 Recommendation is for amputation Patient wants to switch doctors from Dr. [name] but currently agreeable for performing amputation. Patient has seen by podiatry in the past at [facility] Dr. Romualdo Bolk and has gone through multiple excisional surgeries with them. 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 Thoracic and lumbar MRI spine on 4/10 did not show  any  evidence of active infection. On repeat MRI on 4/22 due to ongoing back pain there were evidence of osteomyelitis discitis as well as abscess. Lumbar spine shows no evidence of discitis osteomyelitis Neurosurgery Dr. Yetta Barre was consulted, recommended no surgical intervention. ID recs to cont current vanc, per consult note on 4/22 Per ID, recommendation for at least another week of IV therapy prior to possible change to oral treatment Still complaining of back pain, unchanged per pt, no worse   Duodenitis  seen on admission CT abdomen. Currently no abdominal symptoms. Monitor.   Pain control. Patient reports ongoing pain control issues. Currently on IV Dilaudid as well as oxycodone. Also scheduled Robaxin.  Lyrica on 4/26, dose increased on 4/27 Monitor.   Peripheral vascular disease with critical limb ischemia. Vascular surgery was consulted by Dr. [name] Underwent angiogram 4/25 with angioplasty of right below-knee popliteal artery, tibioperoneal trunk, and peroneal artery   Recommendation for aspirin Plavix and statin.   Hypervolemic hyponatremia. Sodium level trending down Nephrology was consulted.  Treated with tolvaptan without any response.  Later on responded well to diuresis. Per Nephrology, baseline Na is likely in the mid to upper 120's Na stable at 126. Continue fluid restriction and diuretic   AKI on CKD 3A. Baseline 1.1.  On admission 1.4. Cr stable   Hypomagnesemia: -replaced.     HTN. Blood pressure stable. Currently continuing current regimen.   Oral thrush. Nystatin. Resolved.   Type of diabetes mellitus uncontrolled with hyperglycemia with long-term insulin use. Hemoglobin A1c 9.1 Currently on 20 units of Semglee and sliding scale insulin. Dose adjusted.  Change Ensure to Glucerna. Monitor.   Chronic A-fib. Currently rate controlled. Eliquis on hold. Currently switching to Lovenox.   Adult failure to thrive. No evidence of protein-calorie  malnutrition so far. Continue supplementation.   Acute on chronic combined CHF. Echocardiogram currently 60-65%.  No wall motion abnormality.   Currently on 3.125 mg Coreg, digoxin 0.125 mcg, Lasix 40 mg oral daily. Aldactone was on hold.  Resumed on 4/28.  Entresto on hold. Also on aspirin and Plavix. Continued diuretic per above   Anemia of chronic disease In the setting of osteomyelitis. H&H relatively stable.  Monitor.  No active bleeding.    Anxiety. Patient is on BuSpar 10 mg twice daily I will increase the dose to 15 mg 3 times daily. Already on Lyrica. Continue Atarax as needed.  ARF -Cr up to 1.82 -concerns for vol overload. Continue diuretic -Cr down to 1.24   Subjective: Hoping to be able to go home soon  Physical Exam: Vitals:   08/06/22 0500 08/06/22 0757 08/06/22 0843 08/06/22 1152  BP:  128/88  112/70  Pulse:  79  79  Resp:  16  20  Temp:  98.4 F (36.9 C)  98.2 F (36.8 C)  TempSrc:  Oral  Oral  SpO2:  99% 100% 92%  Weight: 83 kg     Height:       General exam: Conversant, in no acute distress Respiratory system: normal chest rise, clear, no audible wheezing Cardiovascular system: regular rhythm, s1-s2 Gastrointestinal system: Nondistended, nontender, pos BS Central nervous system: No seizures, no tremors Extremities: No cyanosis, no joint deformities Skin: No rashes, no pallor Psychiatry: Affect normal // no auditory hallucinations   Data Reviewed:  Labs reviewed: Na 126, K 3.8, Cr 1.24, WBC 11.3, Hgb 9.1, Plts 374  Family Communication: Pt in room, family not at bedside  Disposition: Status is: Inpatient Remains inpatient appropriate because: Severity of  illness  Planned Discharge Destination: Home    Author: Rickey Barbara, MD 08/06/2022 3:25 PM  For on call review [email or web address].

## 2022-08-07 LAB — COMPREHENSIVE METABOLIC PANEL
ALT: 16 U/L (ref 0–44)
AST: 21 U/L (ref 15–41)
Albumin: 2 g/dL — ABNORMAL LOW (ref 3.5–5.0)
Alkaline Phosphatase: 115 U/L (ref 38–126)
Anion gap: 7 (ref 5–15)
BUN: 20 mg/dL (ref 6–20)
CO2: 25 mmol/L (ref 22–32)
Calcium: 8.9 mg/dL (ref 8.9–10.3)
Chloride: 94 mmol/L — ABNORMAL LOW (ref 98–111)
Creatinine, Ser: 1.56 mg/dL — ABNORMAL HIGH (ref 0.61–1.24)
GFR, Estimated: 51 mL/min — ABNORMAL LOW (ref >60)
Glucose, Bld: 321 mg/dL — ABNORMAL HIGH (ref 70–99)
Potassium: 4.2 mmol/L (ref 3.5–5.1)
Sodium: 126 mmol/L — ABNORMAL LOW (ref 135–145)
Total Bilirubin: 0.6 mg/dL (ref 0.3–1.2)
Total Protein: 6.9 g/dL (ref 6.5–8.1)

## 2022-08-07 LAB — CBC
HCT: 27.3 % — ABNORMAL LOW (ref 39.0–52.0)
Hemoglobin: 9.2 g/dL — ABNORMAL LOW (ref 13.0–17.0)
MCH: 29.6 pg (ref 26.0–34.0)
MCHC: 33.7 g/dL (ref 30.0–36.0)
MCV: 87.8 fL (ref 80.0–100.0)
Platelets: 390 10*3/uL (ref 150–400)
RBC: 3.11 MIL/uL — ABNORMAL LOW (ref 4.22–5.81)
RDW: 14.3 % (ref 11.5–15.5)
WBC: 12 10*3/uL — ABNORMAL HIGH (ref 4.0–10.5)
nRBC: 0 % (ref 0.0–0.2)

## 2022-08-07 LAB — AEROBIC/ANAEROBIC CULTURE W GRAM STAIN (SURGICAL/DEEP WOUND): Gram Stain: NONE SEEN

## 2022-08-07 LAB — GLUCOSE, CAPILLARY
Glucose-Capillary: 239 mg/dL — ABNORMAL HIGH (ref 70–99)
Glucose-Capillary: 158 mg/dL — ABNORMAL HIGH (ref 70–99)
Glucose-Capillary: 163 mg/dL — ABNORMAL HIGH (ref 70–99)
Glucose-Capillary: 214 mg/dL — ABNORMAL HIGH (ref 70–99)

## 2022-08-07 MED ORDER — INSULIN GLARGINE-YFGN 100 UNIT/ML ~~LOC~~ SOLN
35.0000 [IU] | Freq: Every day | SUBCUTANEOUS | Status: DC
  Administered 2022-08-07 – 2022-08-19 (×13): 35 [IU] via SUBCUTANEOUS
  Filled 2022-08-07 (×14): qty 0.35

## 2022-08-07 NOTE — Progress Notes (Signed)
Progress Note   Patient: David [name] WUJ:[id number] DOB: 1960/05/14 DOA: 07/12/2022     26 DOS: the patient was seen and examined on 08/07/2022   Brief hospital course: PMH of chronic combined CHF, chronic A-fib on Eliquis, type II DM, prior amputation of right foot, HTN.  Present to the hospital with complaints of fever.  Found to have MRSA bacteremia as well as multiple abscesses.  Originally admitted at [facility] on 4/9.  Blood cultures came positive for MRSA 4/10 ID was consulted. 4/12 TTE was negative, TEE was done which was negative for vegetation 4/18 nephrology consulted for AKI. 4/22 neurosurgery APP [facility]/ Dr Yetta Barre were consulted. Recommend medical management with IV antibiotics only without any intervention. Neurosurgery was consulted and recommend conservative measures. 4/23 Dr. [name] was consulted for amputation.  Vascular surgery was consulted for concerns for PVD. 4/24 IR placed right-sided chest tube for pleural effusion 4/25 underwent right below-knee popliteal artery, tibioperoneal trunk and peroneal angioplasty with Dr. Chestine Spore. 4/26 IR remove the chest tube.  Follow-up chest x-ray still shows pleural effusions. 4/27 patient currently does not want to perform the procedure with Dr. [name].  Informed Dr. [name] as well as consulted Dr. Linna Caprice. 4/29: Patient agreeable to proceed with amputation surgery with orthopedic surgeon Dr. Odis Hollingshead 4/30: Patient underwent right fifth ray revision amputation.  Assessment and Plan: Sepsis secondary to MRSA bacteremia. Meeting SIRS criteria on admission with tachycardia and tachypnea. Also with hyperglycemia and leukocytosis. Blood cultures grew MRSA.  This is complicated by infection spread in multiple body areas. ID was consulted.  Repeat blood cultures negative initially. PICC line was placed on 4/13. Echocardiogram and TEE negative for vegetation. Due to reoccurrence of the fever had another culture drawn on 4/21 which was  positive for MRSA again. Repeat culture on 4/24 negative so far for any new growth. PICC line removed on 4/25. Per ID, pt is continued on current dose of vanc   Bilateral multifocal pneumonia seen on x-ray on 4/17 Right-sided loculated possible parapneumonic effusion. Patient had a chest x-ray at the time of admission which was negative for any acute abnormality. On a follow-up chest x-ray developed left lower lobe abnormalities concerning for pneumonia and after that bilateral concerning infiltrates. Spinal infiltrate showed large right pleural effusion with thick enhancing ring concerning for empyema. Reportedly pulmonary was consulted and recommended IR guided chest tube placement. Chest tube was placed on 4/24. 200 cc of blood-tinged fluid was aspirated. Cultures negative from the area. IR decided to remove the chest tube on 4/26.  Follow-up chest x-ray on 4/26 shows persistent loculated right pleural effusion with atelectasis. Repeat two-view chest x-ray shows persistent right pleural effusion unchanged after chest tube removal and significantly better compared to the one before chest tube placement. 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 Recommendation is for amputation Patient wants to switch doctors from Dr. [name] but currently agreeable for performing amputation. Patient has seen by podiatry in the past at [facility] Dr. Romualdo Bolk and has gone through multiple excisional surgeries with them. 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 Thoracic and lumbar MRI spine on 4/10  did  not show any evidence of active infection. On repeat MRI on 4/22 due to ongoing back pain there were evidence of osteomyelitis discitis as well as abscess. Lumbar spine shows no evidence of discitis osteomyelitis Neurosurgery Dr. Yetta Barre was consulted, recommended no surgical intervention. ID recs to cont current vanc, per consult note on 4/22 Per ID, recommendation for at least another week of IV therapy prior to possible change to oral treatment Still complaining of back pain, reportedly unchanged    Duodenitis  seen on admission CT abdomen. Currently no abdominal symptoms. Monitor.   Pain control. Patient reports ongoing pain control issues. Currently on IV Dilaudid as well as oxycodone. Also scheduled Robaxin.  Lyrica on 4/26, dose increased on 4/27 Monitor.   Peripheral vascular disease with critical limb ischemia. Vascular surgery was consulted by Dr. [name] Underwent angiogram 4/25 with angioplasty of right below-knee popliteal artery, tibioperoneal trunk, and peroneal artery   Recommendation for aspirin Plavix and statin.   Hypervolemic hyponatremia. Sodium level trending down Nephrology was consulted.  Treated with tolvaptan without any response.  Later on responded well to diuresis. Per Nephrology, baseline Na is likely in the mid to upper 120's Na remains stable at 126. Continue fluid restriction and diuretic per last recommendations by Nephrology   AKI on CKD 3A. Baseline 1.1.  On admission 1.4. Cr currently 1.56   Hypomagnesemia: -replaced.     HTN. Blood pressure stable. Currently continuing current regimen.   Oral thrush. Nystatin. Resolved.   Type of diabetes mellitus uncontrolled with hyperglycemia with long-term insulin use. Hemoglobin A1c 9.1 Currently on 35 units of Semglee and sliding scale insulin. Dose adjusted.  Change Ensure to Glucerna. Continue 3 units meal coverage Monitor.   Chronic A-fib. Currently rate controlled. Eliquis on hold.  Currently switching to Lovenox.   Adult failure to thrive. No evidence of protein-calorie malnutrition so far. Continue supplementation.   Acute on chronic combined CHF. Echocardiogram currently 60-65%.  No wall motion abnormality.   Currently on 3.125 mg Coreg, digoxin 0.125 mcg, Lasix 40 mg oral daily. Aldactone was on hold.  Resumed on 4/28.  Entresto on hold. Also on aspirin and Plavix. Continued diuretic per above   Anemia of chronic disease In the setting of osteomyelitis. H&H relatively stable.  Monitor.  No active bleeding.    Anxiety. Patient is on BuSpar 10 mg twice daily I will increase the dose to 15 mg 3 times daily. Already on Lyrica. Continue Atarax as needed.  ARF -Cr up to 1.82 -concerns for vol overload. Continue diuretic -Cr currently at 1.56   Subjective: Feeling anxious today  Physical Exam: Vitals:   08/07/22 0745 08/07/22 0934 08/07/22 1210 08/07/22 1701  BP: 136/66  132/76 137/70  Pulse: 72  72 86  Resp: 19  16 18   Temp: 97.8 F (36.6 C)  97.8 F (36.6 C) 97.9 F (36.6 C)  TempSrc: Oral  Oral Oral  SpO2: 96% 95% 93% 94%  Weight:      Height:       General exam: Conversant, in no acute distress Respiratory system: normal chest rise, clear, no audible wheezing Cardiovascular system: regular rhythm, s1-s2 Gastrointestinal system: Nondistended, nontender, pos BS Central nervous system: No seizures, no tremors Extremities: No cyanosis, no joint deformities Skin: No rashes, no pallor Psychiatry: Affect normal // no auditory hallucinations   Data Reviewed:  Labs reviewed: Na 126, K 4.2, Cr 1.56, Hgb 9.2  Family Communication: Pt in room, family not at bedside  Disposition: Status is: Inpatient  Remains inpatient appropriate because: Severity of illness  Planned Discharge Destination: Home    Author: Rickey Barbara, MD 08/07/2022 5:09 PM  For on call review [email or web address]

## 2022-08-07 NOTE — Progress Notes (Signed)
Pharmacy Antibiotic Note  David [name] is a 62 y.o. 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 Pharmacy has been consulted for Vancomycin dosing.    Cefazolin was added s/p 5th ray amputation and I&D on 08/02/22, with concern for polymicrobial infection, and high risk for further amputation. Followed by ID. Recommending IV therapy at least until 08/09/22 inpatient. If clinically stable, can transition to oral abx following IV abx at that time. Scr 1.56. Wbc 12.0 and afebrile.   Plan: Continue vancomycin 1gm IV q12h (eAUC 532.6) Continue cefazolin 2g IV q8h  Obtain vancomycin levels as needed for goal AUC 400-550  Monitor renal function, culture data, clinical course, and antibiotic plans F/u ID recommendations   Height: 6' (182.9 cm) Weight: 83 kg (182 lb 15.7 oz) IBW/kg (Calculated) : 77.6  Temp (24hrs), Avg:97.9 F (36.6 C), Min:97.7 F (36.5 C), Max:98.3 F (36.8 C)  Recent Labs  Lab 08/03/22 0204 08/03/22 2021 08/03/22 2231 08/04/22 0220 08/05/22 0157 08/06/22 0137 08/07/22 0111  WBC 11.2*  --   --  10.9* 10.7* 11.3* 12.0*  CREATININE 1.82*  --  1.58* 1.63* 1.39* 1.24 1.56*  VANCORANDOM  --  13  --   --   --   --   --     Estimated Creatinine Clearance: 55.3 mL/min (A) (by C-G formula based on SCr of 1.56 mg/dL (H)).    No Known Allergies  Antimicrobials this admission: CTX/Azithro 4/9 >> 4/10 Vancomycin 4/10 >>4/12; 4/23 >>  Daptomycin 4/12 >> 4/15; 4/21>>422 Oritavancin x 1 on 4/15 Nystatin 4/15 >>4/30  Bactroban nasal 4/30>>(5/3) Cefazolin 4/30 >>  Dose adjustments this admission:   Microbiology results: 4/9 blood: 4/4 MRSA (susc sent out to [facility] for Dapto) 4/9 respiratory panel: neg 4/11 blood: NGF 4/21 blood: 2/2 MRSA (S: gent, TCN, vanc (MIC <0.5), clinda, rifampin) 4/24 blood: neg 4/24 Empyema: neg 4/29 MRSA PCR: positive 4/30 bone (bone biopsy): few Staph aureus - ( S: gent, tetracycline, vanc  (MIC <0.5), clinda, rifampin, linezolid) 4/30 wound (bone biopsy): few Staph aureus - ( S: gent, tetracycline, vanc (MIC <0.5), clinda, rifampin, linezolid)  Thank you for allowing pharmacy to be a part of this patient's care.  Jerry Caras, PharmD PGY1 Pharmacy Resident   08/07/2022 10:17 AM

## 2022-08-08 ENCOUNTER — Inpatient Hospital Stay (HOSPITAL_COMMUNITY): Payer: Medicaid Other

## 2022-08-08 DIAGNOSIS — M4640 Discitis, unspecified, site unspecified: Secondary | ICD-10-CM | POA: Diagnosis present

## 2022-08-08 DIAGNOSIS — M4604 Spinal enthesopathy, thoracic region: Secondary | ICD-10-CM

## 2022-08-08 DIAGNOSIS — B9562 Methicillin resistant Staphylococcus aureus infection as the cause of diseases classified elsewhere: Secondary | ICD-10-CM | POA: Diagnosis present

## 2022-08-08 LAB — COMPREHENSIVE METABOLIC PANEL
ALT: 15 U/L (ref 0–44)
AST: 20 U/L (ref 15–41)
Albumin: 2.4 g/dL — ABNORMAL LOW (ref 3.5–5.0)
Alkaline Phosphatase: 126 U/L (ref 38–126)
Anion gap: 10 (ref 5–15)
BUN: 20 mg/dL (ref 6–20)
CO2: 25 mmol/L (ref 22–32)
Calcium: 9.5 mg/dL (ref 8.9–10.3)
Chloride: 91 mmol/L — ABNORMAL LOW (ref 98–111)
Creatinine, Ser: 1.62 mg/dL — ABNORMAL HIGH (ref 0.61–1.24)
GFR, Estimated: 48 mL/min — ABNORMAL LOW (ref >60)
Glucose, Bld: 223 mg/dL — ABNORMAL HIGH (ref 70–99)
Potassium: 4.4 mmol/L (ref 3.5–5.1)
Sodium: 126 mmol/L — ABNORMAL LOW (ref 135–145)
Total Bilirubin: 0.9 mg/dL (ref 0.3–1.2)
Total Protein: 8.2 g/dL — ABNORMAL HIGH (ref 6.5–8.1)

## 2022-08-08 LAB — CBC
HCT: 32.2 % — ABNORMAL LOW (ref 39.0–52.0)
Hemoglobin: 11 g/dL — ABNORMAL LOW (ref 13.0–17.0)
MCH: 30 pg (ref 26.0–34.0)
MCHC: 34.2 g/dL (ref 30.0–36.0)
MCV: 87.7 fL (ref 80.0–100.0)
Platelets: 512 10*3/uL — ABNORMAL HIGH (ref 150–400)
RBC: 3.67 MIL/uL — ABNORMAL LOW (ref 4.22–5.81)
RDW: 14.5 % (ref 11.5–15.5)
WBC: 14.6 10*3/uL — ABNORMAL HIGH (ref 4.0–10.5)
nRBC: 0 % (ref 0.0–0.2)

## 2022-08-08 LAB — GLUCOSE, CAPILLARY
Glucose-Capillary: 180 mg/dL — ABNORMAL HIGH (ref 70–99)
Glucose-Capillary: 141 mg/dL — ABNORMAL HIGH (ref 70–99)
Glucose-Capillary: 110 mg/dL — ABNORMAL HIGH (ref 70–99)
Glucose-Capillary: 157 mg/dL — ABNORMAL HIGH (ref 70–99)

## 2022-08-08 MED ORDER — OXYCODONE HCL 5 MG PO TABS
10.0000 mg | ORAL_TABLET | ORAL | Status: DC | PRN
  Administered 2022-08-08 – 2022-08-12 (×10): 10 mg via ORAL
  Filled 2022-08-08 (×10): qty 2

## 2022-08-08 MED ORDER — OXYCODONE HCL ER 10 MG PO T12A
10.0000 mg | EXTENDED_RELEASE_TABLET | Freq: Two times a day (BID) | ORAL | Status: DC
  Administered 2022-08-08 – 2022-08-13 (×10): 10 mg via ORAL
  Filled 2022-08-08 (×10): qty 1

## 2022-08-08 NOTE — Progress Notes (Signed)
   08/08/22 1300  Mobility  Activity Ambulated independently in hallway  Level of Assistance Standby assist, set-up cues, supervision of patient - no hands on  Assistive Device Front wheel walker  Distance Ambulated (ft) 300 ft  Activity Response Tolerated well  Mobility Referral Yes  $Mobility charge 1 Mobility   Pt eager to ambulate. Put on post op shoe with minA. Got up independently after set up. Able to maintain weight bearing through heel. Left sitting EOB.   Thompson Grayer  Mobility Specialist

## 2022-08-08 NOTE — Progress Notes (Signed)
Progress Note   Patient: David [name] WUX:[id number] DOB: [birth year] DOA: 07/12/2022     27 DOS: the patient was seen and examined on 08/08/2022   Brief hospital course: PMH of chronic combined CHF, chronic A-fib on Eliquis, type II DM, prior amputation of right foot, HTN.  Present to the hospital with complaints of fever.  Found to have MRSA bacteremia as well as multiple abscesses.  Originally admitted at [facility] on 4/9.  Blood cultures came positive for MRSA 4/10 ID was consulted. 4/12 TTE was negative, TEE was done which was negative for vegetation 4/18 nephrology consulted for AKI. 4/22 neurosurgery [facility]/ Dr Yetta Barre were consulted. Recommend medical management with IV antibiotics only without any intervention. Neurosurgery was consulted and recommend conservative measures. 4/23 Dr. [name] was consulted for amputation.  Vascular surgery was consulted for concerns for PVD. 4/24 IR placed right-sided chest tube for pleural effusion 4/25 underwent right below-knee popliteal artery, tibioperoneal trunk and peroneal angioplasty with Dr. Chestine Spore. 4/26 IR remove the chest tube.  Follow-up chest x-ray still shows pleural effusions. 4/27 patient currently does not want to perform the procedure with Dr. [name].  Informed Dr. [name] as well as consulted Dr. Linna Caprice. 4/29: Patient agreeable to proceed with amputation surgery with orthopedic surgeon Dr. Odis Hollingshead 4/30: Patient underwent right fifth ray revision amputation.  Assessment and Plan: Sepsis secondary to MRSA bacteremia. Meeting SIRS criteria on admission with tachycardia and tachypnea. Also with hyperglycemia and leukocytosis. Blood cultures grew MRSA.  This is complicated by infection spread in multiple body areas. ID was consulted.  Repeat blood cultures negative initially. PICC line was placed on 4/13. Echocardiogram and TEE negative for vegetation. Due to reoccurrence of the fever had another culture drawn on 4/21 which was  positive for MRSA again. Repeat culture on 4/24 negative so far for any new growth. PICC line removed on 4/25. Per ID, pt is continued on current dose of vanc   Bilateral multifocal pneumonia seen on x-ray on 4/17 Right-sided loculated possible parapneumonic effusion. Patient had a chest x-ray at the time of admission which was negative for any acute abnormality. On a follow-up chest x-ray developed left lower lobe abnormalities concerning for pneumonia and after that bilateral concerning infiltrates. Spinal infiltrate showed large right pleural effusion with thick enhancing ring concerning for empyema. Reportedly pulmonary was consulted and recommended IR guided chest tube placement. Chest tube was placed on 4/24. 200 cc of blood-tinged fluid was aspirated. Cultures negative from the area. IR decided to remove the chest tube on 4/26.  Follow-up chest x-ray on 4/26 shows persistent loculated right pleural effusion with atelectasis. Repeat two-view chest x-ray shows persistent right pleural effusion unchanged after chest tube removal and significantly better compared to the one before chest tube placement. 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 Recommendation is for amputation Patient wants to switch doctors from Dr. [name] but currently agreeable for performing amputation. Patient has seen by podiatry in the past at [facility] Dr. Romualdo Bolk and has gone through multiple excisional surgeries with them. 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 Thoracic and lumbar MRI spine on 4/10  did  not show any evidence of active infection. On repeat MRI on 4/22 due to ongoing back pain there were evidence of osteomyelitis discitis as well as abscess. Lumbar spine shows no evidence of discitis osteomyelitis Neurosurgery Dr. Yetta Barre was consulted, recommended no surgical intervention. ID recs to cont current vanc, per consult note on 4/22 Still complaining of back pain, discussed with ID who recommended repeat MRI, done this AM.   Duodenitis  seen on admission CT abdomen. Currently no abdominal symptoms. Cont to monitor   Pain control. Patient reports ongoing pain control issues. Currently on IV Dilaudid as well as oxycodone. Also scheduled Robaxin.  Lyrica on 4/26, dose increased on 4/27 Monitor.   Peripheral vascular disease with critical limb ischemia. Vascular surgery was consulted by Dr. [name] Underwent angiogram 4/25 with angioplasty of right below-knee popliteal artery, tibioperoneal trunk, and peroneal artery   Recommendation for aspirin Plavix and statin.   Hypervolemic hyponatremia. Sodium level trending down Nephrology was consulted.  Treated with tolvaptan without any response.  Later on responded well to diuresis. Per Nephrology, baseline Na is likely in the mid to upper 120's Na remains stable at 126. Continue fluid restriction and diuretic per last recommendations by Nephrology   AKI on CKD 3A. Baseline 1.1.  On admission 1.4. Cr currently 1.56   Hypomagnesemia: -replaced.     HTN. Blood pressure stable. Currently continuing current regimen.   Oral thrush. Nystatin. Resolved.   Type of diabetes mellitus uncontrolled with hyperglycemia with long-term insulin use. Hemoglobin A1c 9.1 Currently on 35 units of Semglee and sliding scale insulin. Dose adjusted.  Change Ensure to Glucerna. Continue 3 units meal coverage Monitor.   Chronic A-fib. Currently rate controlled. Eliquis on hold. Currently switching to Lovenox.   Adult failure to  thrive. No evidence of protein-calorie malnutrition so far. Continue supplementation.   Acute on chronic combined CHF. Echocardiogram currently 60-65%.  No wall motion abnormality.   Currently on 3.125 mg Coreg, digoxin 0.125 mcg, Lasix 40 mg oral daily. Aldactone was on hold.  Resumed on 4/28.  Entresto on hold. Also on aspirin and Plavix. Continued diuretic per above   Anemia of chronic disease In the setting of osteomyelitis. H&H relatively stable.  Monitor.  No active bleeding.    Anxiety. Patient is on BuSpar 10 mg twice daily I will increase the dose to 15 mg 3 times daily. Already on Lyrica. Continue Atarax as needed.  ARF -Cr up to 1.82 -concerns for vol overload. Continued diuretic -Cr currently at 1.62   Subjective: Complaining of on-going back pains  Physical Exam: Vitals:   08/07/22 1940 08/08/22 0355 08/08/22 1227 08/08/22 1635  BP: 114/82 105/72 119/62 137/81  Pulse: 95 82 89 71  Resp: 20 (!) 21 20 20   Temp: 98.2 F (36.8 C) 98.3 F (36.8 C) 97.7 F (36.5 C) 98 F (36.7 C)  TempSrc: Oral Oral Oral Oral  SpO2: 97% 95% 99% 97%  Weight:      Height:       General exam: Awake, laying in bed, in nad Respiratory system: Normal respiratory effort, no wheezing Cardiovascular system: regular rate, s1, s2 Gastrointestinal system: Soft, nondistended, positive BS Central nervous system: CN2-12 grossly intact, strength intact Extremities: Perfused, no clubbing Skin: Normal skin turgor, no notable skin lesions seen Psychiatry: Mood normal // no visual hallucinations   Data Reviewed:  Labs reviewed: Na 126, K 4.4, Cr 1.62, WBC 14.6, Hgb 11.0  Family Communication: Pt in room, family not at bedside  Disposition: Status  is: Inpatient Remains inpatient appropriate because: Severity of illness  Planned Discharge Destination: Home    Author: Rickey Barbara, MD 08/08/2022 4:45 PM  For on call review [email or web address].

## 2022-08-08 NOTE — Progress Notes (Signed)
Subjective:  And thoracic regionWorsening back pain  Antibiotics:  Anti-infectives (From admission, onward)    Start     Dose/Rate Route Frequency Ordered Stop   08/04/22 1000  vancomycin (VANCOREADY) IVPB 1250 mg/250 mL  Status:  Discontinued        1,250 mg 166.7 mL/hr over 90 Minutes Intravenous Every 24 hours 08/03/22 1150 08/03/22 2212   08/04/22 1000  vancomycin (VANCOCIN) IVPB 1000 mg/200 mL premix        1,000 mg 200 mL/hr over 60 Minutes Intravenous Every 12 hours 08/04/22 0026     08/03/22 2300  vancomycin (VANCOCIN) IVPB 1000 mg/200 mL premix        1,000 mg 200 mL/hr over 60 Minutes Intravenous  Once 08/03/22 2212 08/04/22 1038   08/03/22 0400  ceFAZolin (ANCEF) IVPB 2g/100 mL premix        2 g 200 mL/hr over 30 Minutes Intravenous Every 8 hours 08/02/22 2038     08/02/22 2000  ceFAZolin (ANCEF) IVPB 1 g/50 mL premix  Status:  Discontinued        1 g 100 mL/hr over 30 Minutes Intravenous Every 8 hours 08/02/22 1847 08/02/22 2038   08/02/22 1226  vancomycin (VANCOCIN) powder  Status:  Discontinued          As needed 08/02/22 1250 08/02/22 1251   07/26/22 2000  vancomycin (VANCOCIN) IVPB 1000 mg/200 mL premix  Status:  Discontinued        1,000 mg 200 mL/hr over 60 Minutes Intravenous Every 12 hours 07/26/22 1054 08/03/22 1150   07/24/22 1845  DAPTOmycin (CUBICIN) 650 mg in sodium chloride 0.9 % IVPB  Status:  Discontinued        8 mg/kg  83.9 kg 126 mL/hr over 30 Minutes Intravenous Daily 07/24/22 1759 07/26/22 1050   07/18/22 1500  Oritavancin Diphosphate (ORBACTIV) 1,200 mg in dextrose 5 % IVPB        1,200 mg 333.3 mL/hr over 180 Minutes Intravenous Once 07/18/22 1351 07/18/22 1822   07/16/22 0000  daptomycin (CUBICIN) IVPB  Status:  Discontinued        650 mg Intravenous Every 24 hours 07/16/22 1116 07/19/22    07/15/22 2000  DAPTOmycin (CUBICIN) 650 mg in sodium chloride 0.9 % IVPB  Status:  Discontinued        8 mg/kg  81 kg 126 mL/hr over 30  Minutes Intravenous Daily 07/15/22 1411 07/15/22 1413   07/15/22 2000  DAPTOmycin (CUBICIN) 650 mg in sodium chloride 0.9 % IVPB  Status:  Discontinued        8 mg/kg  81 kg 126 mL/hr over 30 Minutes Intravenous Daily 07/15/22 1413 07/18/22 1351   07/14/22 0200  vancomycin (VANCOREADY) IVPB 1750 mg/350 mL  Status:  Discontinued        1,750 mg 175 mL/hr over 120 Minutes Intravenous Every 24 hours 07/13/22 0718 07/15/22 1411   07/13/22 1000  cefTRIAXone (ROCEPHIN) 1 g in sodium chloride 0.9 % 100 mL IVPB  Status:  Discontinued        1 g 200 mL/hr over 30 Minutes Intravenous Every 24 hours 07/12/22 1405 07/13/22 0239   07/13/22 1000  azithromycin (ZITHROMAX) 500 mg in sodium chloride 0.9 % 250 mL IVPB  Status:  Discontinued        500 mg 250 mL/hr over 60 Minutes Intravenous Every 24 hours 07/12/22 1405 07/13/22 0239   07/13/22 0330  vancomycin (VANCOREADY) IVPB 1750 mg/350 mL  1,750 mg 175 mL/hr over 120 Minutes Intravenous  Once 07/13/22 0240 07/13/22 0517   07/12/22 0845  cefTRIAXone (ROCEPHIN) 1 g in sodium chloride 0.9 % 100 mL IVPB        1 g 200 mL/hr over 30 Minutes Intravenous  Once 07/12/22 0844 07/12/22 1218   07/12/22 0845  azithromycin (ZITHROMAX) 500 mg in sodium chloride 0.9 % 250 mL IVPB        500 mg 250 mL/hr over 60 Minutes Intravenous  Once 07/12/22 0844 07/12/22 1218       Medications: Scheduled Meds:  apixaban  5 mg Oral BID   aspirin EC  81 mg Oral Daily   atorvastatin  80 mg Oral Daily   budesonide (PULMICORT) nebulizer solution  0.5 mg Nebulization BID   busPIRone  15 mg Oral TID   carvedilol  3.125 mg Oral BID   Chlorhexidine Gluconate Cloth  6 each Topical Daily   digoxin  125 mcg Oral Daily   docusate sodium  100 mg Oral BID   famotidine  20 mg Oral BID   feeding supplement (GLUCERNA SHAKE)  237 mL Oral TID BM   furosemide  40 mg Oral Daily   insulin aspart  0-20 Units Subcutaneous TID WC   insulin aspart  3 Units Subcutaneous TID WC    insulin glargine-yfgn  35 Units Subcutaneous Daily   lidocaine  1 patch Transdermal Daily   magnesium oxide  400 mg Oral Daily   methocarbamol  500 mg Oral TID   polyethylene glycol  17 g Oral Daily   pregabalin  50 mg Oral TID   senna-docusate  2 tablet Oral BID   sodium chloride flush  10-40 mL Intracatheter Q12H   spironolactone  25 mg Oral Daily   Continuous Infusions:   ceFAZolin (ANCEF) IV 2 g (08/08/22 1217)   vancomycin 1,000 mg (08/08/22 0848)   PRN Meds:.acetaminophen, Glycerin (Adult), HYDROmorphone (DILAUDID) injection, hydrOXYzine, ipratropium-albuterol, labetalol, Muscle Rub, ondansetron (ZOFRAN) IV, ondansetron **OR** [DISCONTINUED] ondansetron (ZOFRAN) IV, mouth rinse, oxyCODONE, sodium chloride flush    Objective: Weight change:   Intake/Output Summary (Last 24 hours) at 08/08/2022 1253 Last data filed at 08/08/2022 0929 Gross per 24 hour  Intake 1400 ml  Output 1100 ml  Net 300 ml   Blood pressure 119/62, pulse 89, temperature 97.7 F (36.5 C), temperature source Oral, resp. rate 20, height 6' (1.829 m), weight 83 kg, SpO2 99 %. Temp:  [97.7 F (36.5 C)-98.3 F (36.8 C)] 97.7 F (36.5 C) (05/06 1227) Pulse Rate:  [82-95] 89 (05/06 1227) Resp:  [18-21] 20 (05/06 1227) BP: (105-137)/(62-82) 119/62 (05/06 1227) SpO2:  [94 %-99 %] 99 % (05/06 1227)  Physical Exam: Physical Exam Constitutional:      Appearance: He is well-developed.  HENT:     Head: Normocephalic and atraumatic.  Eyes:     Conjunctiva/sclera: Conjunctivae normal.  Cardiovascular:     Rate and Rhythm: Normal rate and regular rhythm.     Heart sounds: No murmur heard.    No friction rub. No gallop.  Pulmonary:     Effort: Pulmonary effort is normal. No respiratory distress.     Breath sounds: Decreased air movement present. No stridor. Examination of the right-lower field reveals decreased breath sounds. Examination of the left-lower field reveals decreased breath sounds. Decreased breath  sounds present. No wheezing.  Abdominal:     General: There is no distension.     Palpations: Abdomen is soft.  Musculoskeletal:  Cervical back: Normal range of motion and neck supple.  Skin:    General: Skin is warm and dry.     Findings: No erythema or rash.  Neurological:     General: No focal deficit present.     Mental Status: He is alert and oriented to person, place, and time.  Psychiatric:        Mood and Affect: Mood normal.        Behavior: Behavior normal.        Thought Content: Thought content normal.        Judgment: Judgment normal.     He is quite tender in paravertebral area in T spine  CBC:    BMET Recent Labs    08/07/22 0111 08/08/22 0133  NA 126* 126*  K 4.2 4.4  CL 94* 91*  CO2 25 25  GLUCOSE 321* 223*  BUN 20 20  CREATININE 1.56* 1.62*  CALCIUM 8.9 9.5     Liver Panel  Recent Labs    08/07/22 0111 08/08/22 0133  PROT 6.9 8.2*  ALBUMIN 2.0* 2.4*  AST 21 20  ALT 16 15  ALKPHOS 115 126  BILITOT 0.6 0.9       Sedimentation Rate No results for input(s): "ESRSEDRATE" in the last 72 hours. C-Reactive Protein No results for input(s): "CRP" in the last 72 hours.  Micro Results: Recent Results (from the past 720 hour(s))  Resp panel by RT-PCR (RSV, Flu A&B, Covid) Anterior Nasal Swab     Status: None   Collection Time: 07/09/22  5:56 PM   Specimen: Anterior Nasal Swab  Result Value Ref Range Status   SARS Coronavirus 2 by RT PCR NEGATIVE NEGATIVE Final    Comment: (NOTE) SARS-CoV-2 target nucleic acids are NOT DETECTED.  The SARS-CoV-2 RNA is generally detectable in upper respiratory specimens during the acute phase of infection. The lowest concentration of SARS-CoV-2 viral copies this assay can detect is 138 copies/mL. A negative result does not preclude SARS-Cov-2 infection and should not be used as the sole basis for treatment or other patient management decisions. A negative result may occur with  improper specimen  collection/handling, submission of specimen other than nasopharyngeal swab, presence of viral mutation(s) within the areas targeted by this assay, and inadequate number of viral copies(<138 copies/mL). A negative result must be combined with clinical observations, patient history, and epidemiological information. The expected result is Negative.  Fact Sheet for Patients:  BloggerCourse.com  Fact Sheet for Healthcare Providers:  SeriousBroker.it  This test is no t yet approved or cleared by the [place] FDA and  has been authorized for detection and/or diagnosis of SARS-CoV-2 by FDA under an Emergency Use Authorization (EUA). This EUA will remain  in effect (meaning this test can be used) for the duration of the COVID-19 declaration under Section 564(b)(1) of the Act, 21 U.S.C.section 360bbb-3(b)(1), unless the authorization is terminated  or revoked sooner.       Influenza A by PCR NEGATIVE NEGATIVE Final   Influenza B by PCR NEGATIVE NEGATIVE Final    Comment: (NOTE) The Xpert Xpress SARS-CoV-2/FLU/RSV plus assay is intended as an aid in the diagnosis of influenza from Nasopharyngeal swab specimens and should not be used as a sole basis for treatment. Nasal washings and aspirates are unacceptable for Xpert Xpress SARS-CoV-2/FLU/RSV testing.  Fact Sheet for Patients: BloggerCourse.com  Fact Sheet for Healthcare Providers: SeriousBroker.it  This test is not yet approved or cleared by the [place] FDA and has been  authorized for detection and/or diagnosis of SARS-CoV-2 by FDA under an Emergency Use Authorization (EUA). This EUA will remain in effect (meaning this test can be used) for the duration of the COVID-19 declaration under Section 564(b)(1) of the Act, 21 U.S.C. section 360bbb-3(b)(1), unless the authorization is terminated or revoked.     Resp Syncytial  Virus by PCR NEGATIVE NEGATIVE Final    Comment: (NOTE) Fact Sheet for Patients: BloggerCourse.com  Fact Sheet for Healthcare Providers: SeriousBroker.it  This test is not yet approved or cleared by the [place] FDA and has been authorized for detection and/or diagnosis of SARS-CoV-2 by FDA under an Emergency Use Authorization (EUA). This EUA will remain in effect (meaning this test can be used) for the duration of the COVID-19 declaration under Section 564(b)(1) of the Act, 21 U.S.C. section 360bbb-3(b)(1), unless the authorization is terminated or revoked.  Performed at [facility], [address], [place], [place] [address]   Blood culture (routine x 2)     Status: Abnormal   Collection Time: 07/12/22  8:48 AM   Specimen: BLOOD RIGHT ARM  Result Value Ref Range Status   Specimen Description   Final    BLOOD RIGHT ARM Performed at [facility], [address], [place], [place] [address]    Special Requests   Final    BOTTLES DRAWN AEROBIC AND ANAEROBIC Blood Culture results may not be optimal due to an inadequate volume of blood received in culture bottles Performed at [facility], [address] [place], [place] [address]    Culture  Setup Time   Final    GRAM POSITIVE COCCI IN CLUSTERS IN BOTH AEROBIC AND ANAEROBIC BOTTLES CRITICAL VALUE NOTED.  VALUE IS CONSISTENT WITH PREVIOUSLY REPORTED AND CALLED VALUE.    Culture (A)  Final    STAPHYLOCOCCUS AUREUS SUSCEPTIBILITIES PERFORMED ON PREVIOUS CULTURE WITHIN THE LAST 5 DAYS. Performed at [facility], [address] [place], [place] [address]    Report Status 07/14/2022 FINAL  Final  Blood culture (routine x 2)     Status: Abnormal   Collection Time: 07/12/22  8:50 AM   Specimen: BLOOD  Result Value Ref Range Status   Specimen Description   Final    BLOOD BLOOD LEFT ARM Performed at [facility], [address], [place], [place] [address]    Special Requests   Final    BOTTLES DRAWN AEROBIC AND ANAEROBIC Blood Culture results may not be optimal due to an inadequate volume of blood received in culture bottles Performed at [facility], [address], [place], [place] [address]    Culture  Setup Time   Final    GRAM POSITIVE COCCI IN CLUSTERS IN BOTH AEROBIC AND ANAEROBIC BOTTLES CRITICAL RESULT CALLED TO, READ BACK BY AND VERIFIED WITH: PHARMD M. Doran Durand 07/13/22 @ 0152 BY AB    Culture (A)  Final    METHICILLIN RESISTANT STAPHYLOCOCCUS AUREUS SEE SEPARATE REPORT FOR DAPTOMYCIN Performed at [facility], [address] [place], [place] [address]    Report Status 07/19/2022 FINAL  Final   Organism ID, Bacteria METHICILLIN RESISTANT STAPHYLOCOCCUS AUREUS  Final      Susceptibility   Methicillin resistant staphylococcus aureus - MIC*    CIPROFLOXACIN >=8 RESISTANT Resistant     ERYTHROMYCIN >=8 RESISTANT Resistant     GENTAMICIN <=0.5 SENSITIVE Sensitive     OXACILLIN >=4 RESISTANT Resistant     TETRACYCLINE <=1  SENSITIVE Sensitive     VANCOMYCIN <=0.5 SENSITIVE Sensitive     TRIMETH/SULFA >=320 RESISTANT Resistant     CLINDAMYCIN <=0.25 SENSITIVE Sensitive     RIFAMPIN <=0.5 SENSITIVE Sensitive     Inducible Clindamycin NEGATIVE Sensitive     * METHICILLIN RESISTANT STAPHYLOCOCCUS AUREUS  Blood Culture ID Panel (Reflexed)     Status: Abnormal   Collection Time: 07/12/22  8:50 AM  Result Value Ref Range Status   Enterococcus faecalis NOT DETECTED NOT DETECTED Final   Enterococcus Faecium NOT DETECTED NOT DETECTED Final   Listeria monocytogenes NOT DETECTED NOT DETECTED Final   Staphylococcus species DETECTED (A) NOT DETECTED Final    Comment: CRITICAL RESULT CALLED TO, READ BACK BY AND VERIFIED WITH: PHARMD M. LILLISTON 07/13/22 @ 0152 BY AB    Staphylococcus aureus (BCID) DETECTED (A) NOT DETECTED Final    Comment: Methicillin  (oxacillin)-resistant Staphylococcus aureus (MRSA). MRSA is predictably resistant to beta-lactam antibiotics (except ceftaroline). Preferred therapy is vancomycin unless clinically contraindicated. Patient requires contact precautions if  hospitalized. CRITICAL RESULT CALLED TO, READ BACK BY AND VERIFIED WITH: PHARMD M. LILLISTON 07/13/22 @ 0152 BY AB    Staphylococcus epidermidis NOT DETECTED NOT DETECTED Final   Staphylococcus lugdunensis NOT DETECTED NOT DETECTED Final   Streptococcus species NOT DETECTED NOT DETECTED Final   Streptococcus agalactiae NOT DETECTED NOT DETECTED Final   Streptococcus pneumoniae NOT DETECTED NOT DETECTED Final   Streptococcus pyogenes NOT DETECTED NOT DETECTED Final   A.calcoaceticus-baumannii NOT DETECTED NOT DETECTED Final   Bacteroides fragilis NOT DETECTED NOT DETECTED Final   Enterobacterales NOT DETECTED NOT DETECTED Final   Enterobacter cloacae complex NOT DETECTED NOT DETECTED Final   Escherichia coli NOT DETECTED NOT DETECTED Final   Klebsiella aerogenes NOT DETECTED NOT DETECTED Final   Klebsiella oxytoca NOT DETECTED NOT DETECTED Final   Klebsiella pneumoniae NOT DETECTED NOT DETECTED Final   Proteus species NOT DETECTED NOT DETECTED Final   Salmonella species NOT DETECTED NOT DETECTED Final   Serratia marcescens NOT DETECTED NOT DETECTED Final   Haemophilus influenzae NOT DETECTED NOT DETECTED Final   Neisseria meningitidis NOT DETECTED NOT DETECTED Final   Pseudomonas aeruginosa NOT DETECTED NOT DETECTED Final   Stenotrophomonas maltophilia NOT DETECTED NOT DETECTED Final   Candida albicans NOT DETECTED NOT DETECTED Final   Candida auris NOT DETECTED NOT DETECTED Final   Candida glabrata NOT DETECTED NOT DETECTED Final   Candida krusei NOT DETECTED NOT DETECTED Final   Candida parapsilosis NOT DETECTED NOT DETECTED Final   Candida tropicalis NOT DETECTED NOT DETECTED Final   Cryptococcus neoformans/gattii NOT DETECTED NOT DETECTED Final    Meth resistant mecA/C and MREJ DETECTED (A) NOT DETECTED Final    Comment: CRITICAL RESULT CALLED TO, READ BACK BY AND VERIFIED WITH: PHARMD M. LILLISTON 07/13/22 @ 0152 BY AB Performed at [facility], [address] [place], [place] [address]   SARS Coronavirus 2 by RT PCR (hospital order, performed in [facility] hospital lab) *cepheid single result test* Anterior Nasal Swab     Status: None   Collection Time: 07/12/22  9:24 AM   Specimen: Anterior Nasal Swab  Result Value Ref Range Status   SARS Coronavirus 2 by RT PCR NEGATIVE NEGATIVE Final    Comment: (NOTE) SARS-CoV-2 target nucleic acids are NOT DETECTED.  The SARS-CoV-2 RNA is generally detectable in upper and lower respiratory specimens during the acute phase of infection. The lowest concentration of SARS-CoV-2 viral copies this assay can detect is  250 copies / mL. A negative result does not preclude SARS-CoV-2 infection and should not be used as the sole basis for treatment or other patient management decisions.  A negative result may occur with improper specimen collection / handling, submission of specimen other than nasopharyngeal swab, presence of viral mutation(s) within the areas targeted by this assay, and inadequate number of viral copies (<250 copies / mL). A negative result must be combined with clinical observations, patient history, and epidemiological information.  Fact Sheet for Patients:   RoadLapTop.co.za  Fact Sheet for Healthcare Providers: http://kim-miller.com/  This test is not yet approved or  cleared by the [place] FDA and has been authorized for detection and/or diagnosis of SARS-CoV-2 by FDA under an Emergency Use Authorization (EUA).  This EUA will remain in effect (meaning this test can be used) for the duration of the COVID-19 declaration under Section 564(b)(1) of the Act, 21 U.S.C. section 360bbb-3(b)(1), unless the authorization is  terminated or revoked sooner.  Performed at [facility], [address] [place], [place] [address]   Respiratory (~20 pathogens) panel by PCR     Status: None   Collection Time: 07/12/22  2:03 PM   Specimen: Nasopharyngeal Swab; Respiratory  Result Value Ref Range Status   Adenovirus NOT DETECTED NOT DETECTED Final   Coronavirus 229E NOT DETECTED NOT DETECTED Final    Comment: (NOTE) The Coronavirus on the Respiratory Panel, DOES NOT test for the novel  Coronavirus (2019 nCoV)    Coronavirus HKU1 NOT DETECTED NOT DETECTED Final   Coronavirus NL63 NOT DETECTED NOT DETECTED Final   Coronavirus OC43 NOT DETECTED NOT DETECTED Final   Metapneumovirus NOT DETECTED NOT DETECTED Final   Rhinovirus / Enterovirus NOT DETECTED NOT DETECTED Final   Influenza A NOT DETECTED NOT DETECTED Final   Influenza B NOT DETECTED NOT DETECTED Final   Parainfluenza Virus 1 NOT DETECTED NOT DETECTED Final   Parainfluenza Virus 2 NOT DETECTED NOT DETECTED Final   Parainfluenza Virus 3 NOT DETECTED NOT DETECTED Final   Parainfluenza Virus 4 NOT DETECTED NOT DETECTED Final   Respiratory Syncytial Virus NOT DETECTED NOT DETECTED Final   Bordetella pertussis NOT DETECTED NOT DETECTED Final   Bordetella Parapertussis NOT DETECTED NOT DETECTED Final   Chlamydophila pneumoniae NOT DETECTED NOT DETECTED Final   Mycoplasma pneumoniae NOT DETECTED NOT DETECTED Final    Comment: Performed at [facility], [address], [place], [place] [address]  Minimum Inhibitory Conc. (1 Drug)     Status: None   Collection Time: 07/13/22  2:47 PM  Result Value Ref Range Status   Min Inhibitory Conc (1 Drug) Final report  Corrected    Comment: (NOTE) Performed At: [facility] [address] [place], [place] [id number] Jolene Schimke MD WG:[id number] CORRECTED ON 04/16 AT 1236: PREVIOUSLY REPORTED AS Preliminary report    Source CRE SAUR BLOOD  Final    Comment: Performed at [facility], [address] [place], [place] [address]  Culture, blood (Routine X 2) w Reflex to ID Panel     Status: None   Collection Time: 07/14/22  5:25 AM   Specimen: BLOOD  Result Value Ref Range Status   Specimen Description   Final    BLOOD LEFT ANTECUBITAL Performed at [facility], [address] [place], [place] [address]    Special Requests   Final    BOTTLES DRAWN AEROBIC AND ANAEROBIC Blood Culture adequate volume Performed at [facility], [address], [place], [place] [address]    Culture   Final    NO GROWTH 5 DAYS Performed at [facility], [address] [place], [place] [address]    Report Status 07/19/2022 FINAL  Final  Culture, blood (Routine X 2) w Reflex to ID Panel     Status: None   Collection Time: 07/14/22  5:25 AM   Specimen: BLOOD  Result Value Ref Range Status   Specimen Description   Final    BLOOD BLOOD LEFT HAND Performed at [facility], [address] [place], [place] [address]    Special Requests   Final    BOTTLES DRAWN AEROBIC AND ANAEROBIC Blood Culture adequate volume Performed at [facility], [address], [place], [place] [address]    Culture   Final    NO GROWTH 5 DAYS Performed at [facility], [address], [place], [place] [address]    Report Status 07/19/2022 FINAL  Final  Culture, blood (Routine X 2) w Reflex to ID Panel     Status: Abnormal   Collection Time: 07/24/22  3:55 PM   Specimen: BLOOD LEFT ARM  Result Value Ref Range Status   Specimen Description   Final    BLOOD LEFT ARM Performed at [facility], [address] [place], [place] [address]    Special Requests   Final    AEROBIC BOTTLE ONLY Blood Culture adequate volume Performed at [facility], [address], [place], [place] [address]    Culture  Setup Time   Final    GRAM POSITIVE COCCI IN CLUSTERS AEROBIC BOTTLE  ONLY CRITICAL RESULT CALLED TO, READ BACK BY AND VERIFIED WITH: PHARMD T. JOHNSON 07/26/22 @ 0016 BY AB Performed at [facility], [address], [place], [place] [address]    Culture METHICILLIN RESISTANT STAPHYLOCOCCUS AUREUS (A)  Final   Report Status 07/27/2022 FINAL  Final   Organism ID, Bacteria METHICILLIN RESISTANT STAPHYLOCOCCUS AUREUS  Final      Susceptibility   Methicillin resistant staphylococcus aureus - MIC*    CIPROFLOXACIN >=8 RESISTANT Resistant     ERYTHROMYCIN >=8 RESISTANT Resistant     GENTAMICIN <=0.5 SENSITIVE Sensitive     OXACILLIN >=4 RESISTANT Resistant     TETRACYCLINE <=1 SENSITIVE Sensitive     VANCOMYCIN <=0.5 SENSITIVE Sensitive     TRIMETH/SULFA >=320 RESISTANT Resistant     CLINDAMYCIN <=0.25 SENSITIVE Sensitive     RIFAMPIN <=0.5 SENSITIVE Sensitive     Inducible Clindamycin NEGATIVE Sensitive     * METHICILLIN RESISTANT STAPHYLOCOCCUS AUREUS  Culture, blood (Routine X 2) w Reflex to ID Panel     Status: Abnormal   Collection Time: 07/24/22  3:55 PM   Specimen: BLOOD  Result Value Ref Range Status   Specimen Description   Final    BLOOD BLOOD RIGHT ARM Performed at [facility], [address] [place], [place] [address]    Special Requests   Final    AEROBIC BOTTLE ONLY Blood Culture adequate volume Performed at [facility], [address] [place], [place] [address]    Culture  Setup Time   Final    GRAM POSITIVE COCCI IN CLUSTERS AEROBIC BOTTLE ONLY CRITICAL VALUE NOTED.  VALUE IS CONSISTENT WITH PREVIOUSLY REPORTED AND CALLED VALUE. CRITICAL RESULT CALLED TO, READ BACK BY AND VERIFIED WITH: PHARMD M.BELL AT 1315 ON 07/25/2022 BY  T.SAAD.    Culture (A)  Final    STAPHYLOCOCCUS AUREUS SUSCEPTIBILITIES PERFORMED ON PREVIOUS CULTURE WITHIN THE LAST 5 DAYS. Performed at [facility], [address], [place], [place] [address]    Report Status 07/27/2022 FINAL  Final  Blood Culture ID  Panel (Reflexed)     Status: Abnormal   Collection Time: 07/24/22  3:55 PM  Result Value Ref Range Status   Enterococcus faecalis NOT DETECTED NOT DETECTED Final   Enterococcus Faecium NOT DETECTED NOT DETECTED Final   Listeria monocytogenes NOT DETECTED NOT DETECTED Final   Staphylococcus species DETECTED (A) NOT DETECTED Final    Comment: CRITICAL RESULT CALLED TO, READ BACK BY AND VERIFIED WITH: PHARMD T. JOHNSON 07/26/22 @ 0016 BY AB    Staphylococcus aureus (BCID) DETECTED (A) NOT DETECTED Final    Comment: Methicillin (oxacillin)-resistant Staphylococcus aureus (MRSA). MRSA is predictably resistant to beta-lactam antibiotics (except ceftaroline). Preferred therapy is vancomycin unless clinically contraindicated. Patient requires contact precautions if  hospitalized. CRITICAL RESULT CALLED TO, READ BACK BY AND VERIFIED WITH: PHARMD T. JOHNSON 07/26/22 @ 0016 BY AB    Staphylococcus epidermidis NOT DETECTED NOT DETECTED Final   Staphylococcus lugdunensis NOT DETECTED NOT DETECTED Final   Streptococcus species NOT DETECTED NOT DETECTED Final   Streptococcus agalactiae NOT DETECTED NOT DETECTED Final   Streptococcus pneumoniae NOT DETECTED NOT DETECTED Final   Streptococcus pyogenes NOT DETECTED NOT DETECTED Final   A.calcoaceticus-baumannii NOT DETECTED NOT DETECTED Final   Bacteroides fragilis NOT DETECTED NOT DETECTED Final   Enterobacterales NOT DETECTED NOT DETECTED Final   Enterobacter cloacae complex NOT DETECTED NOT DETECTED Final   Escherichia coli NOT DETECTED NOT DETECTED Final   Klebsiella aerogenes NOT DETECTED NOT DETECTED Final   Klebsiella oxytoca NOT DETECTED NOT DETECTED Final   Klebsiella pneumoniae NOT DETECTED NOT DETECTED Final   Proteus species NOT DETECTED NOT DETECTED Final   Salmonella species NOT DETECTED NOT DETECTED Final   Serratia marcescens NOT DETECTED NOT DETECTED Final   Haemophilus influenzae NOT DETECTED NOT DETECTED Final   Neisseria  meningitidis NOT DETECTED NOT DETECTED Final   Pseudomonas aeruginosa NOT DETECTED NOT DETECTED Final   Stenotrophomonas maltophilia NOT DETECTED NOT DETECTED Final   Candida albicans NOT DETECTED NOT DETECTED Final   Candida auris NOT DETECTED NOT DETECTED Final   Candida glabrata NOT DETECTED NOT DETECTED Final   Candida krusei NOT DETECTED NOT DETECTED Final   Candida parapsilosis NOT DETECTED NOT DETECTED Final   Candida tropicalis NOT DETECTED NOT DETECTED Final   Cryptococcus neoformans/gattii NOT DETECTED NOT DETECTED Final   Meth resistant mecA/C and MREJ DETECTED (A) NOT DETECTED Final    Comment: CRITICAL RESULT CALLED TO, READ BACK BY AND VERIFIED WITH: PHARMD T. JOHNSON 07/26/22 @ 0016 BY AB Performed at [facility], [address], [place], [place] [address]   Culture, blood (Routine X 2) w Reflex to ID Panel     Status: None   Collection Time: 07/27/22 12:15 PM   Specimen: BLOOD  Result Value Ref Range Status   Specimen Description BLOOD RIGHT ANTECUBITAL  Final   Special Requests   Final    BOTTLES DRAWN AEROBIC AND ANAEROBIC Blood Culture adequate volume   Culture   Final    NO GROWTH 5 DAYS Performed at [facility], [address], [place], [place] [address]    Report Status 08/01/2022 FINAL  Final  Culture, blood (Routine X 2) w Reflex to ID Panel  Status: None   Collection Time: 07/27/22 12:15 PM   Specimen: BLOOD  Result Value Ref Range Status   Specimen Description BLOOD RIGHT ANTECUBITAL  Final   Special Requests   Final    BOTTLES DRAWN AEROBIC AND ANAEROBIC Blood Culture adequate volume   Culture   Final    NO GROWTH 5 DAYS Performed at [facility], [address], [place], [place] [address]    Report Status 08/01/2022 FINAL  Final  Aerobic/Anaerobic Culture w Gram Stain (surgical/deep wound)     Status: None   Collection Time: 07/27/22  5:04 PM   Specimen: Abscess  Result Value Ref Range Status   Specimen Description ABSCESS  CHEST RIGHT  Final   Special Requests DRAIN  Final   Gram Stain NO WBC SEEN NO ORGANISMS SEEN   Final   Culture   Final    No growth aerobically or anaerobically. Performed at [facility], [address], [place], [place] [address]    Report Status 08/01/2022 FINAL  Final  MRSA Next Gen by PCR, Nasal     Status: Abnormal   Collection Time: 08/01/22 11:18 PM   Specimen: Nasal Mucosa; Nasal Swab  Result Value Ref Range Status   MRSA by PCR Next Gen DETECTED (A) NOT DETECTED Final    Comment: RESULTS CALLED TO, READ BACK BY AND VERIFIED WITH RN K.DAVIDSON ON 08/02/22 AT 0117 BY NM (NOTE) The GeneXpert MRSA Assay (FDA approved for NASAL specimens only), is one component of a comprehensive MRSA colonization surveillance program. It is not intended to diagnose MRSA infection nor to guide or monitor treatment for MRSA infections. Test performance is not FDA approved in patients less than 42 years old. Performed at [facility], [address] [place], [place] [address]   Aerobic/Anaerobic Culture w Gram Stain (surgical/deep wound)     Status: None   Collection Time: 08/02/22 12:16 PM   Specimen: PATH Bone biopsy; Tissue  Result Value Ref Range Status   Specimen Description BONE  Final   Special Requests NONE  Final   Gram Stain NO WBC SEEN RARE GRAM NEGATIVE COCCI IN PAIRS   Final   Culture   Final    FEW METHICILLIN RESISTANT STAPHYLOCOCCUS AUREUS MODERATE ACTINOMYCES SPECIES Standardized susceptibility testing for this organism is not available. NO ANAEROBES ISOLATED Performed at [facility], [address] [place], [place] [address]    Report Status 08/07/2022 FINAL  Final   Organism ID, Bacteria METHICILLIN RESISTANT STAPHYLOCOCCUS AUREUS  Final      Susceptibility   Methicillin resistant staphylococcus aureus - MIC*    CIPROFLOXACIN >=8 RESISTANT Resistant     ERYTHROMYCIN >=8 RESISTANT Resistant     GENTAMICIN <=0.5 SENSITIVE Sensitive     OXACILLIN  >=4 RESISTANT Resistant     TETRACYCLINE <=1 SENSITIVE Sensitive     VANCOMYCIN <=0.5 SENSITIVE Sensitive     TRIMETH/SULFA >=320 RESISTANT Resistant     CLINDAMYCIN <=0.25 SENSITIVE Sensitive     RIFAMPIN <=0.5 SENSITIVE Sensitive     Inducible Clindamycin NEGATIVE Sensitive     LINEZOLID 2 SENSITIVE Sensitive     * FEW METHICILLIN RESISTANT STAPHYLOCOCCUS AUREUS  Aerobic/Anaerobic Culture w Gram Stain (surgical/deep wound)     Status: None   Collection Time: 08/02/22 12:23 PM   Specimen: PATH Bone biopsy; Tissue  Result Value Ref Range Status   Specimen Description WOUND  Final   Special Requests NONE  Final   Gram  Stain NO WBC SEEN RARE GRAM POSITIVE COCCI   Final   Culture   Final    FEW METHICILLIN RESISTANT STAPHYLOCOCCUS AUREUS NO ANAEROBES ISOLATED Performed at [facility], [address] [place], [place] [address]    Report Status 08/07/2022 FINAL  Final   Organism ID, Bacteria METHICILLIN RESISTANT STAPHYLOCOCCUS AUREUS  Final      Susceptibility   Methicillin resistant staphylococcus aureus - MIC*    CIPROFLOXACIN >=8 RESISTANT Resistant     ERYTHROMYCIN >=8 RESISTANT Resistant     GENTAMICIN <=0.5 SENSITIVE Sensitive     OXACILLIN >=4 RESISTANT Resistant     TETRACYCLINE <=1 SENSITIVE Sensitive     VANCOMYCIN 1 SENSITIVE Sensitive     TRIMETH/SULFA >=320 RESISTANT Resistant     CLINDAMYCIN <=0.25 SENSITIVE Sensitive     RIFAMPIN <=0.5 SENSITIVE Sensitive     Inducible Clindamycin NEGATIVE Sensitive     LINEZOLID 2 SENSITIVE Sensitive     * FEW METHICILLIN RESISTANT STAPHYLOCOCCUS AUREUS    Studies/Results: No results found.    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 male with   with admission with MRSA bacteremia due to osteomyelitis of the foot, with seeding also of T spine with discitis osteomyelitis at T8-T9 with a loculated abscess anterior to T7-T9 vertebral bodies concerning for multiloculated prevertebral abscess along with enhancing material along the anterior aspect of thecal sac posterior T8 and T9 concerning for epidural phlegmon with large right pleural empyema  He is now sp 5th ray amputation  I am concerned by his worsening back pain despite appropriate antibiotics  I am repeating MRI of the thoracic spine.  Will continue vancomycin .Marland Kitchen But WHY is he also getting cefazolin--will DC it unless I can understand what we are treating with it  I think he will ned to stay in the hospital for more protracted antibiotics for now  I have personally spent 52 minutes involved in face-to-face and non-face-to-face activities for this patient on the day of the visit. Professional time spent includes the following activities: Preparing to see the patient (review of tests), Obtaining and/or reviewing separately obtained history (admission/discharge record), Performing a medically appropriate examination and/or evaluation , Ordering medications/tests/procedures, referring and communicating with other health care professionals, Documenting clinical information in the EMR, Independently interpreting results (not separately reported), Communicating results to the patient/family/caregiver,  Counseling and educating the patient/family/caregiver and Care coordination (not separately reported).     LOS: 27 days   Acey Lav 08/08/2022, 12:53 PM

## 2022-08-09 LAB — COMPREHENSIVE METABOLIC PANEL
ALT: 13 U/L (ref 0–44)
AST: 21 U/L (ref 15–41)
Albumin: 2.3 g/dL — ABNORMAL LOW (ref 3.5–5.0)
Alkaline Phosphatase: 114 U/L (ref 38–126)
Anion gap: 9 (ref 5–15)
BUN: 20 mg/dL (ref 6–20)
CO2: 24 mmol/L (ref 22–32)
Calcium: 9.6 mg/dL (ref 8.9–10.3)
Chloride: 94 mmol/L — ABNORMAL LOW (ref 98–111)
Creatinine, Ser: 1.66 mg/dL — ABNORMAL HIGH (ref 0.61–1.24)
GFR, Estimated: 47 mL/min — ABNORMAL LOW (ref >60)
Glucose, Bld: 244 mg/dL — ABNORMAL HIGH (ref 70–99)
Potassium: 4 mmol/L (ref 3.5–5.1)
Sodium: 127 mmol/L — ABNORMAL LOW (ref 135–145)
Total Bilirubin: 0.9 mg/dL (ref 0.3–1.2)
Total Protein: 8 g/dL (ref 6.5–8.1)

## 2022-08-09 LAB — GLUCOSE, CAPILLARY
Glucose-Capillary: 199 mg/dL — ABNORMAL HIGH (ref 70–99)
Glucose-Capillary: 154 mg/dL — ABNORMAL HIGH (ref 70–99)
Glucose-Capillary: 89 mg/dL (ref 70–99)
Glucose-Capillary: 178 mg/dL — ABNORMAL HIGH (ref 70–99)

## 2022-08-09 LAB — CBC
HCT: 32.9 % — ABNORMAL LOW (ref 39.0–52.0)
Hemoglobin: 10.8 g/dL — ABNORMAL LOW (ref 13.0–17.0)
MCH: 29.1 pg (ref 26.0–34.0)
MCHC: 32.8 g/dL (ref 30.0–36.0)
MCV: 88.7 fL (ref 80.0–100.0)
Platelets: 473 10*3/uL — ABNORMAL HIGH (ref 150–400)
RBC: 3.71 MIL/uL — ABNORMAL LOW (ref 4.22–5.81)
RDW: 14.5 % (ref 11.5–15.5)
WBC: 12.3 10*3/uL — ABNORMAL HIGH (ref 4.0–10.5)
nRBC: 0 % (ref 0.0–0.2)

## 2022-08-09 MED ORDER — POLYETHYLENE GLYCOL 3350 17 G PO PACK
17.0000 g | PACK | Freq: Two times a day (BID) | ORAL | Status: DC
  Administered 2022-08-09 – 2022-08-18 (×14): 17 g via ORAL
  Filled 2022-08-09 (×18): qty 1

## 2022-08-09 MED ORDER — MELATONIN 3 MG PO TABS
3.0000 mg | ORAL_TABLET | Freq: Every evening | ORAL | Status: DC | PRN
  Administered 2022-08-09 – 2022-08-15 (×5): 3 mg via ORAL
  Filled 2022-08-09 (×5): qty 1

## 2022-08-09 MED ORDER — POLYETHYLENE GLYCOL 3350 17 G PO PACK: 17.0000 g | PACK | Freq: Two times a day (BID) | ORAL | Status: DC

## 2022-08-09 NOTE — Progress Notes (Signed)
Progress Note   Patient: David [name] AVW:[id number] DOB: 03-13-1961 DOA: 07/12/2022     28 DOS: the patient was seen and examined on 08/09/2022   Brief hospital course: PMH of chronic combined CHF, chronic A-fib on Eliquis, type II DM, prior amputation of right foot, HTN.  Present to the hospital with complaints of fever.  Found to have MRSA bacteremia as well as multiple abscesses.  Originally admitted at [facility] on 4/9.  Blood cultures came positive for MRSA 4/10 ID was consulted. 4/12 TTE was negative, TEE was done which was negative for vegetation 4/18 nephrology consulted for AKI. 4/22 neurosurgery [facility]/ Dr Yetta Barre were consulted. Recommend medical management with IV antibiotics only without any intervention. Neurosurgery was consulted and recommend conservative measures. 4/23 Dr. [name] was consulted for amputation.  Vascular surgery was consulted for concerns for PVD. 4/24 IR placed right-sided chest tube for pleural effusion 4/25 underwent right below-knee popliteal artery, tibioperoneal trunk and peroneal angioplasty with Dr. Chestine Spore. 4/26 IR remove the chest tube.  Follow-up chest x-ray still shows pleural effusions. 4/27 patient currently does not want to perform the procedure with Dr. [name].  Informed Dr. [name] as well as consulted Dr. Linna Caprice. 4/29: Patient agreeable to proceed with amputation surgery with orthopedic surgeon Dr. Odis Hollingshead 4/30: Patient underwent right fifth ray revision amputation.  Assessment and Plan: Sepsis secondary to MRSA bacteremia. Meeting SIRS criteria on admission with tachycardia and tachypnea. Also with hyperglycemia and leukocytosis. Blood cultures grew MRSA.  This is complicated by infection spread in multiple body areas. ID was consulted.  Repeat blood cultures negative initially. PICC line was placed on 4/13. Echocardiogram and TEE negative for vegetation. Due to reoccurrence of the fever had another culture drawn on 4/21 which was  positive for MRSA again. Repeat culture on 4/24 negative so far for any new growth. PICC line removed on 4/25. Continue current dose of vanc for now. Per ID, cont IV abx for at least 10 days, if not 2 weeks prior to considering switching to PO meds   Bilateral multifocal pneumonia seen on x-ray on 4/17 Right-sided loculated possible parapneumonic effusion. Patient had a chest x-ray at the time of admission which was negative for any acute abnormality. On a follow-up chest x-ray developed left lower lobe abnormalities concerning for pneumonia and after that bilateral concerning infiltrates. Spinal infiltrate showed large right pleural effusion with thick enhancing ring concerning for empyema. Reportedly pulmonary was consulted and recommended IR guided chest tube placement. Chest tube was placed on 4/24. 200 cc of blood-tinged fluid was aspirated. IR removed the chest tube on 4/26.  Follow-up chest x-ray on 4/26 shows persistent loculated right pleural effusion with atelectasis. Repeat two-view chest x-ray shows persistent right pleural effusion unchanged after chest tube removal and significantly better compared to the one before chest tube placement. Continue to monitor for now   History of amputation of fifth and fourth toe Acute osteomyelitis of residual fifth metatarsal diaphysis with soft tissue infection with sinus tract Patient was evaluated by orthopedic surgery who recommended right fifth ray revision amputation, done 4/30   T8-T9 discitis and osteomyelitis Prevertebral abscess at T7-T9 vertebral bodies Epidural phlegmon at T8 and T9 thecal sac Patient had complaints of back pain at the time of admission. Thoracic and lumbar MRI spine on 4/10 did not show any evidence of active infection. On repeat MRI on 4/22 due to ongoing back pain there were evidence of osteomyelitis discitis as well as abscess. Neurosurgery Dr. Yetta Barre was consulted,  recommended no surgical intervention. Back  pain worsened, prompting repeat MRI on 5/6 which showed worsening Per Neurosurgery, no surgical intervention is warranted. ID recommends to continue abx per above  Duodenitis  seen on admission CT abdomen. Currently no abdominal symptoms. Cont to monitor   Pain control. Patient reports ongoing pain control issues. Currently on IV Dilaudid as well as oxycodone and scheduled Robaxin, lyrica Have added oxycontin q12h   Peripheral vascular disease with critical limb ischemia. Vascular surgery was consulted by Dr. [name] Underwent angiogram 4/25 with angioplasty of right below-knee popliteal artery, tibioperoneal trunk, and peroneal artery   Recommendation for aspirin Plavix and statin.   Hypervolemic hyponatremia. Nephrology had seen earlier.  Pt s/p tolvaptan without any response.  Later on responded well to diuresis. Per Nephrology, baseline Na is likely in the mid to upper 120's Na remains stable at 127.  -Continue fluid restriction and diuretic Nephro recs   AKI on CKD 3A. Baseline 1.1.  On admission 1.4. Cr currently 1.66 Will liberalize fluid restriction to 1500cc   Hypomagnesemia: -replaced.     HTN. Blood pressure stable. Currently continuing current regimen.   Oral thrush. Nystatin. Resolved.   Type of diabetes mellitus uncontrolled with hyperglycemia with long-term insulin use. Hemoglobin A1c 9.1 Currently on 35 units of Semglee with 3 units meal coverage Cont SSI   Chronic A-fib. Currently rate controlled. Cont eliquis   Adult failure to thrive. No evidence of protein-calorie malnutrition so far. Continue supplementation.   Acute on chronic combined CHF. Echocardiogram currently 60-65%.  No wall motion abnormality.   Currently on 3.125 mg Coreg, digoxin 0.125 mcg, lasix 40mg  daily, spironolactone - Entresto on hold given renal function cont aspirin and Plavix.   Anemia of chronic disease In the setting of osteomyelitis. H&H relatively stable.   Monitor.  No active bleeding.    Anxiety. Patient is on BuSpar 10 mg twice daily I will increase the dose to 15 mg 3 times daily. Cont Lyrica. Continue Atarax as needed.  Subjective: Complaining of constipation today. Agrees with increasing miralax dose  Physical Exam: Vitals:   08/09/22 0600 08/09/22 0840 08/09/22 1201 08/09/22 1731  BP: (!) 142/85 126/79 119/80 (!) 157/84  Pulse:    62  Resp: 20 18 18 18   Temp:  97.6 F (36.4 C) 97.6 F (36.4 C) 98.1 F (36.7 C)  TempSrc:  Oral Oral Oral  SpO2:      Weight:      Height:       General exam: Conversant, in no acute distress Respiratory system: normal chest rise, clear, no audible wheezing Cardiovascular system: regular rhythm, s1-s2 Gastrointestinal system: Nondistended, nontender, pos BS Central nervous system: No seizures, no tremors Extremities: No cyanosis, no joint deformities Skin: No rashes, no pallor Psychiatry: Affect normal // no auditory hallucinations   Data Reviewed:  Labs reviewed: Na 127, K 4.0, Cr 1.66, WBC 12.3, Hgb 10.8  Family Communication: Pt in room, family not at bedside  Disposition: Status is: Inpatient Remains inpatient appropriate because: Severity of illness  Planned Discharge Destination: Home    Author: Rickey Barbara, MD 08/09/2022 6:16 PM  For on call review [email or web address].

## 2022-08-09 NOTE — Progress Notes (Signed)
Patient ID: David [name], male   DOB: 02/12/1961, 62 y.o.   MRN: [id number] Pt seen, c/o back pain in am, moving around well, oob to chair stands and walks easily  MRI T reviewed - still evidence of discitis/ osteomyelitis T7-9 with mild loss vert body ht T8, mild retropulsion and mild stenosis. No focal epidural abscess to drain. I see nothing that needs surgery at this time  A TLSO brace may help pain with mobilization and prevent further vertebral compression/ kyphosis  Continue abx

## 2022-08-09 NOTE — Progress Notes (Signed)
Orthopedic Tech Progress Note Patient Details:  David [name] Jan 02, 1961 [id number]  Ortho Devices Type of Ortho Device: Thoracolumbar corset (TLSO) Ortho Device/Splint Location: BACK Ortho Device/Splint Interventions: Ordered, Application, Adjustment, Removal   Post Interventions Patient Tolerated: Well Instructions Provided: Care of device  [name] 08/09/2022, 9:45 AM

## 2022-08-09 NOTE — Progress Notes (Signed)
Subjective:  And thoracic regionWorsening back pain  Antibiotics:  Anti-infectives (From admission, onward)    Start     Dose/Rate Route Frequency Ordered Stop   08/04/22 1000  vancomycin (VANCOREADY) IVPB 1250 mg/250 mL  Status:  Discontinued        1,250 mg 166.7 mL/hr over 90 Minutes Intravenous Every 24 hours 08/03/22 1150 08/03/22 2212   08/04/22 1000  vancomycin (VANCOCIN) IVPB 1000 mg/200 mL premix        1,000 mg 200 mL/hr over 60 Minutes Intravenous Every 12 hours 08/04/22 0026     08/03/22 2300  vancomycin (VANCOCIN) IVPB 1000 mg/200 mL premix        1,000 mg 200 mL/hr over 60 Minutes Intravenous  Once 08/03/22 2212 08/04/22 1038   08/03/22 0400  ceFAZolin (ANCEF) IVPB 2g/100 mL premix  Status:  Discontinued        2 g 200 mL/hr over 30 Minutes Intravenous Every 8 hours 08/02/22 2038 08/08/22 1302   08/02/22 2000  ceFAZolin (ANCEF) IVPB 1 g/50 mL premix  Status:  Discontinued        1 g 100 mL/hr over 30 Minutes Intravenous Every 8 hours 08/02/22 1847 08/02/22 2038   08/02/22 1226  vancomycin (VANCOCIN) powder  Status:  Discontinued          As needed 08/02/22 1250 08/02/22 1251   07/26/22 2000  vancomycin (VANCOCIN) IVPB 1000 mg/200 mL premix  Status:  Discontinued        1,000 mg 200 mL/hr over 60 Minutes Intravenous Every 12 hours 07/26/22 1054 08/03/22 1150   07/24/22 1845  DAPTOmycin (CUBICIN) 650 mg in sodium chloride 0.9 % IVPB  Status:  Discontinued        8 mg/kg  83.9 kg 126 mL/hr over 30 Minutes Intravenous Daily 07/24/22 1759 07/26/22 1050   07/18/22 1500  Oritavancin Diphosphate (ORBACTIV) 1,200 mg in dextrose 5 % IVPB        1,200 mg 333.3 mL/hr over 180 Minutes Intravenous Once 07/18/22 1351 07/18/22 1822   07/16/22 0000  daptomycin (CUBICIN) IVPB  Status:  Discontinued        650 mg Intravenous Every 24 hours 07/16/22 1116 07/19/22    07/15/22 2000  DAPTOmycin (CUBICIN) 650 mg in sodium chloride 0.9 % IVPB  Status:  Discontinued        8  mg/kg  81 kg 126 mL/hr over 30 Minutes Intravenous Daily 07/15/22 1411 07/15/22 1413   07/15/22 2000  DAPTOmycin (CUBICIN) 650 mg in sodium chloride 0.9 % IVPB  Status:  Discontinued        8 mg/kg  81 kg 126 mL/hr over 30 Minutes Intravenous Daily 07/15/22 1413 07/18/22 1351   07/14/22 0200  vancomycin (VANCOREADY) IVPB 1750 mg/350 mL  Status:  Discontinued        1,750 mg 175 mL/hr over 120 Minutes Intravenous Every 24 hours 07/13/22 0718 07/15/22 1411   07/13/22 1000  cefTRIAXone (ROCEPHIN) 1 g in sodium chloride 0.9 % 100 mL IVPB  Status:  Discontinued        1 g 200 mL/hr over 30 Minutes Intravenous Every 24 hours 07/12/22 1405 07/13/22 0239   07/13/22 1000  azithromycin (ZITHROMAX) 500 mg in sodium chloride 0.9 % 250 mL IVPB  Status:  Discontinued        500 mg 250 mL/hr over 60 Minutes Intravenous Every 24 hours 07/12/22 1405 07/13/22 0239   07/13/22 0330  vancomycin (VANCOREADY) IVPB 1750  mg/350 mL        1,750 mg 175 mL/hr over 120 Minutes Intravenous  Once 07/13/22 0240 07/13/22 0517   07/12/22 0845  cefTRIAXone (ROCEPHIN) 1 g in sodium chloride 0.9 % 100 mL IVPB        1 g 200 mL/hr over 30 Minutes Intravenous  Once 07/12/22 0844 07/12/22 1218   07/12/22 0845  azithromycin (ZITHROMAX) 500 mg in sodium chloride 0.9 % 250 mL IVPB        500 mg 250 mL/hr over 60 Minutes Intravenous  Once 07/12/22 0844 07/12/22 1218       Medications: Scheduled Meds:  apixaban  5 mg Oral BID   aspirin EC  81 mg Oral Daily   atorvastatin  80 mg Oral Daily   budesonide (PULMICORT) nebulizer solution  0.5 mg Nebulization BID   busPIRone  15 mg Oral TID   carvedilol  3.125 mg Oral BID   Chlorhexidine Gluconate Cloth  6 each Topical Daily   digoxin  125 mcg Oral Daily   docusate sodium  100 mg Oral BID   famotidine  20 mg Oral BID   feeding supplement (GLUCERNA SHAKE)  237 mL Oral TID BM   furosemide  40 mg Oral Daily   insulin aspart  0-20 Units Subcutaneous TID WC   insulin aspart  3  Units Subcutaneous TID WC   insulin glargine-yfgn  35 Units Subcutaneous Daily   lidocaine  1 patch Transdermal Daily   magnesium oxide  400 mg Oral Daily   methocarbamol  500 mg Oral TID   oxyCODONE  10 mg Oral Q12H   polyethylene glycol  17 g Oral Daily   pregabalin  50 mg Oral TID   senna-docusate  2 tablet Oral BID   sodium chloride flush  10-40 mL Intracatheter Q12H   spironolactone  25 mg Oral Daily   Continuous Infusions:  vancomycin 1,000 mg (08/09/22 0850)   PRN Meds:.acetaminophen, Glycerin (Adult), HYDROmorphone (DILAUDID) injection, hydrOXYzine, ipratropium-albuterol, labetalol, melatonin, Muscle Rub, ondansetron (ZOFRAN) IV, ondansetron **OR** [DISCONTINUED] ondansetron (ZOFRAN) IV, mouth rinse, oxyCODONE, sodium chloride flush    Objective: Weight change:   Intake/Output Summary (Last 24 hours) at 08/09/2022 1130 Last data filed at 08/09/2022 0100 Gross per 24 hour  Intake 1560 ml  Output 900 ml  Net 660 ml    Blood pressure 126/79, pulse 71, temperature 97.6 F (36.4 C), temperature source Oral, resp. rate 18, height 6' (1.829 m), weight 83 kg, SpO2 100 %. Temp:  [97.6 F (36.4 C)-98 F (36.7 C)] 97.6 F (36.4 C) (05/07 0840) Pulse Rate:  [71-89] 71 (05/06 1635) Resp:  [18-20] 18 (05/07 0840) BP: (119-142)/(62-85) 126/79 (05/07 0840) SpO2:  [97 %-100 %] 100 % (05/06 2010)  Physical Exam: Physical Exam Constitutional:      Appearance: He is well-developed.  HENT:     Head: Normocephalic and atraumatic.  Eyes:     Conjunctiva/sclera: Conjunctivae normal.  Cardiovascular:     Rate and Rhythm: Normal rate and regular rhythm.  Pulmonary:     Effort: Pulmonary effort is normal. No respiratory distress.     Breath sounds: No wheezing.  Abdominal:     General: There is no distension.     Palpations: Abdomen is soft.  Musculoskeletal:        General: Normal range of motion.     Cervical back: Normal range of motion and neck supple.  Skin:    General:  Skin is warm and dry.  Findings: No erythema or rash.  Neurological:     General: No focal deficit present.     Mental Status: He is alert and oriented to person, place, and time.  Psychiatric:        Mood and Affect: Mood normal.        Behavior: Behavior normal.        Thought Content: Thought content normal.        Judgment: Judgment normal.     He is c/o back pain  CBC:    BMET Recent Labs    08/08/22 0133 08/09/22 0135  NA 126* 127*  K 4.4 4.0  CL 91* 94*  CO2 25 24  GLUCOSE 223* 244*  BUN 20 20  CREATININE 1.62* 1.66*  CALCIUM 9.5 9.6      Liver Panel  Recent Labs    08/08/22 0133 08/09/22 0135  PROT 8.2* 8.0  ALBUMIN 2.4* 2.3*  AST 20 21  ALT 15 13  ALKPHOS 126 114  BILITOT 0.9 0.9        Sedimentation Rate No results for input(s): "ESRSEDRATE" in the last 72 hours. C-Reactive Protein No results for input(s): "CRP" in the last 72 hours.  Micro Results: Recent Results (from the past 720 hour(s))  Blood culture (routine x 2)     Status: Abnormal   Collection Time: 07/12/22  8:48 AM   Specimen: BLOOD RIGHT ARM  Result Value Ref Range Status   Specimen Description   Final    BLOOD RIGHT ARM Performed at [facility], [address] [place], [place] [address]    Special Requests   Final    BOTTLES DRAWN AEROBIC AND ANAEROBIC Blood Culture results may not be optimal due to an inadequate volume of blood received in culture bottles Performed at [facility], [address] [place], [place] [address]    Culture  Setup Time   Final    GRAM POSITIVE COCCI IN CLUSTERS IN BOTH AEROBIC AND ANAEROBIC BOTTLES CRITICAL VALUE NOTED.  VALUE IS CONSISTENT WITH PREVIOUSLY REPORTED AND CALLED VALUE.    Culture (A)  Final    STAPHYLOCOCCUS AUREUS SUSCEPTIBILITIES PERFORMED ON PREVIOUS CULTURE WITHIN THE LAST 5 DAYS. Performed at [facility], [address] [place], [place] [address]    Report Status 07/14/2022  FINAL  Final  Blood culture (routine x 2)     Status: Abnormal   Collection Time: 07/12/22  8:50 AM   Specimen: BLOOD  Result Value Ref Range Status   Specimen Description   Final    BLOOD BLOOD LEFT ARM Performed at [facility], [address], [place], [place] [address]    Special Requests   Final    BOTTLES DRAWN AEROBIC AND ANAEROBIC Blood Culture results may not be optimal due to an inadequate volume of blood received in culture bottles Performed at [facility], [address], [place], [place] [address]    Culture  Setup Time   Final    GRAM POSITIVE COCCI IN CLUSTERS IN BOTH AEROBIC AND ANAEROBIC BOTTLES CRITICAL RESULT CALLED TO, READ BACK BY AND VERIFIED WITH: PHARMD M. Doran Durand 07/13/22 @ 0152 BY AB    Culture (A)  Final    METHICILLIN RESISTANT STAPHYLOCOCCUS AUREUS SEE SEPARATE REPORT FOR DAPTOMYCIN Performed at [facility], [address], [place], [place] [address]    Report Status 07/19/2022 FINAL  Final   Organism ID, Bacteria METHICILLIN RESISTANT STAPHYLOCOCCUS AUREUS  Final      Susceptibility   Methicillin resistant staphylococcus aureus - MIC*    CIPROFLOXACIN >=8 RESISTANT Resistant     ERYTHROMYCIN >=8 RESISTANT Resistant     GENTAMICIN <=0.5 SENSITIVE Sensitive     OXACILLIN >=4 RESISTANT Resistant     TETRACYCLINE <=1 SENSITIVE Sensitive     VANCOMYCIN <=0.5 SENSITIVE Sensitive     TRIMETH/SULFA >=320 RESISTANT Resistant     CLINDAMYCIN <=0.25 SENSITIVE Sensitive     RIFAMPIN <=0.5 SENSITIVE Sensitive     Inducible Clindamycin NEGATIVE Sensitive     * METHICILLIN RESISTANT STAPHYLOCOCCUS AUREUS  Blood Culture ID Panel (Reflexed)     Status: Abnormal   Collection Time: 07/12/22  8:50 AM  Result Value Ref Range Status   Enterococcus faecalis NOT DETECTED NOT DETECTED Final   Enterococcus Faecium NOT DETECTED NOT DETECTED Final   Listeria monocytogenes NOT DETECTED NOT DETECTED Final   Staphylococcus  species DETECTED (A) NOT DETECTED Final    Comment: CRITICAL RESULT CALLED TO, READ BACK BY AND VERIFIED WITH: PHARMD M. LILLISTON 07/13/22 @ 0152 BY AB    Staphylococcus aureus (BCID) DETECTED (A) NOT DETECTED Final    Comment: Methicillin (oxacillin)-resistant Staphylococcus aureus (MRSA). MRSA is predictably resistant to beta-lactam antibiotics (except ceftaroline). Preferred therapy is vancomycin unless clinically contraindicated. Patient requires contact precautions if  hospitalized. CRITICAL RESULT CALLED TO, READ BACK BY AND VERIFIED WITH: PHARMD M. LILLISTON 07/13/22 @ 0152 BY AB    Staphylococcus epidermidis NOT DETECTED NOT DETECTED Final   Staphylococcus lugdunensis NOT DETECTED NOT DETECTED Final   Streptococcus species NOT DETECTED NOT DETECTED Final   Streptococcus agalactiae NOT DETECTED NOT DETECTED Final   Streptococcus pneumoniae NOT DETECTED NOT DETECTED Final   Streptococcus pyogenes NOT DETECTED NOT DETECTED Final   A.calcoaceticus-baumannii NOT DETECTED NOT DETECTED Final   Bacteroides fragilis NOT DETECTED NOT DETECTED Final   Enterobacterales NOT DETECTED NOT DETECTED Final   Enterobacter cloacae complex NOT DETECTED NOT DETECTED Final   Escherichia coli NOT DETECTED NOT DETECTED Final   Klebsiella aerogenes NOT DETECTED NOT DETECTED Final   Klebsiella oxytoca NOT DETECTED NOT DETECTED Final   Klebsiella pneumoniae NOT DETECTED NOT DETECTED Final   Proteus species NOT DETECTED NOT DETECTED Final   Salmonella species NOT DETECTED NOT DETECTED Final   Serratia marcescens NOT DETECTED NOT DETECTED Final   Haemophilus influenzae NOT DETECTED NOT DETECTED Final   Neisseria meningitidis NOT DETECTED NOT DETECTED Final   Pseudomonas aeruginosa NOT DETECTED NOT DETECTED Final   Stenotrophomonas maltophilia NOT DETECTED NOT DETECTED Final   Candida albicans NOT DETECTED NOT DETECTED Final   Candida auris NOT DETECTED NOT DETECTED Final   Candida glabrata NOT DETECTED NOT  DETECTED Final   Candida krusei NOT DETECTED NOT DETECTED Final   Candida parapsilosis NOT DETECTED NOT DETECTED Final   Candida tropicalis NOT DETECTED NOT DETECTED Final   Cryptococcus neoformans/gattii NOT DETECTED NOT DETECTED Final   Meth resistant mecA/C and MREJ DETECTED (A) NOT DETECTED Final    Comment: CRITICAL RESULT CALLED TO, READ BACK BY AND VERIFIED WITH: PHARMD M. LILLISTON 07/13/22 @ 0152 BY AB Performed at [facility], [address] [place], [place] [address]   SARS Coronavirus 2 by RT PCR (hospital order, performed in [facility] hospital lab) *cepheid single result test* Anterior Nasal Swab     Status: None   Collection Time: 07/12/22  9:24 AM   Specimen: Anterior Nasal Swab  Result Value Ref Range Status  SARS Coronavirus 2 by RT PCR NEGATIVE NEGATIVE Final    Comment: (NOTE) SARS-CoV-2 target nucleic acids are NOT DETECTED.  The SARS-CoV-2 RNA is generally detectable in upper and lower respiratory specimens during the acute phase of infection. The lowest concentration of SARS-CoV-2 viral copies this assay can detect is 250 copies / mL. A negative result does not preclude SARS-CoV-2 infection and should not be used as the sole basis for treatment or other patient management decisions.  A negative result may occur with improper specimen collection / handling, submission of specimen other than nasopharyngeal swab, presence of viral mutation(s) within the areas targeted by this assay, and inadequate number of viral copies (<250 copies / mL). A negative result must be combined with clinical observations, patient history, and epidemiological information.  Fact Sheet for Patients:   RoadLapTop.co.za  Fact Sheet for Healthcare Providers: http://kim-miller.com/  This test is not yet approved or  cleared by the [place] FDA and has been authorized for detection and/or diagnosis of SARS-CoV-2 by FDA under an  Emergency Use Authorization (EUA).  This EUA will remain in effect (meaning this test can be used) for the duration of the COVID-19 declaration under Section 564(b)(1) of the Act, 21 U.S.C. section 360bbb-3(b)(1), unless the authorization is terminated or revoked sooner.  Performed at [facility], [address], [place], [place] [address]   Respiratory (~20 pathogens) panel by PCR     Status: None   Collection Time: 07/12/22  2:03 PM   Specimen: Nasopharyngeal Swab; Respiratory  Result Value Ref Range Status   Adenovirus NOT DETECTED NOT DETECTED Final   Coronavirus 229E NOT DETECTED NOT DETECTED Final    Comment: (NOTE) The Coronavirus on the Respiratory Panel, DOES NOT test for the novel  Coronavirus (2019 nCoV)    Coronavirus HKU1 NOT DETECTED NOT DETECTED Final   Coronavirus NL63 NOT DETECTED NOT DETECTED Final   Coronavirus OC43 NOT DETECTED NOT DETECTED Final   Metapneumovirus NOT DETECTED NOT DETECTED Final   Rhinovirus / Enterovirus NOT DETECTED NOT DETECTED Final   Influenza A NOT DETECTED NOT DETECTED Final   Influenza B NOT DETECTED NOT DETECTED Final   Parainfluenza Virus 1 NOT DETECTED NOT DETECTED Final   Parainfluenza Virus 2 NOT DETECTED NOT DETECTED Final   Parainfluenza Virus 3 NOT DETECTED NOT DETECTED Final   Parainfluenza Virus 4 NOT DETECTED NOT DETECTED Final   Respiratory Syncytial Virus NOT DETECTED NOT DETECTED Final   Bordetella pertussis NOT DETECTED NOT DETECTED Final   Bordetella Parapertussis NOT DETECTED NOT DETECTED Final   Chlamydophila pneumoniae NOT DETECTED NOT DETECTED Final   Mycoplasma pneumoniae NOT DETECTED NOT DETECTED Final    Comment: Performed at [facility], [address] [place], [place] [address]  Minimum Inhibitory Conc. (1 Drug)     Status: None   Collection Time: 07/13/22  2:47 PM  Result Value Ref Range Status   Min Inhibitory Conc (1 Drug) Final report  Corrected    Comment: (NOTE) Performed  At: [facility] [address] [place], [place] [id number] Jolene Schimke MD LK:[id number] CORRECTED ON 04/16 AT 1236: PREVIOUSLY REPORTED AS Preliminary report    Source CRE SAUR BLOOD  Final    Comment: Performed at [facility], [address] [place], [place] [address]  Culture, blood (Routine X 2) w Reflex to ID Panel     Status: None   Collection Time: 07/14/22  5:25 AM   Specimen: BLOOD  Result Value  Ref Range Status   Specimen Description   Final    BLOOD LEFT ANTECUBITAL Performed at [facility], [address] [place], [place] [address]    Special Requests   Final    BOTTLES DRAWN AEROBIC AND ANAEROBIC Blood Culture adequate volume Performed at [facility], [address], [place], [place] [address]    Culture   Final    NO GROWTH 5 DAYS Performed at [facility], [address], [place], [place] [address]    Report Status 07/19/2022 FINAL  Final  Culture, blood (Routine X 2) w Reflex to ID Panel     Status: None   Collection Time: 07/14/22  5:25 AM   Specimen: BLOOD  Result Value Ref Range Status   Specimen Description   Final    BLOOD BLOOD LEFT HAND Performed at [facility], [address] [place], [place] [address]    Special Requests   Final    BOTTLES DRAWN AEROBIC AND ANAEROBIC Blood Culture adequate volume Performed at [facility], [address], [place], [place] [address]    Culture   Final    NO GROWTH 5 DAYS Performed at [facility], [address] [place], [place] [address]    Report Status 07/19/2022 FINAL  Final  Culture, blood (Routine X 2) w Reflex to ID Panel     Status: Abnormal   Collection Time: 07/24/22  3:55 PM   Specimen: BLOOD LEFT ARM  Result Value Ref Range Status   Specimen Description   Final    BLOOD LEFT ARM Performed at [facility], [address] [place], [place] [address]    Special Requests    Final    AEROBIC BOTTLE ONLY Blood Culture adequate volume Performed at [facility], [address] [place], [place] [address]    Culture  Setup Time   Final    GRAM POSITIVE COCCI IN CLUSTERS AEROBIC BOTTLE ONLY CRITICAL RESULT CALLED TO, READ BACK BY AND VERIFIED WITH: PHARMD T. JOHNSON 07/26/22 @ 0016 BY AB Performed at [facility], [address] [place], [place] [address]    Culture METHICILLIN RESISTANT STAPHYLOCOCCUS AUREUS (A)  Final   Report Status 07/27/2022 FINAL  Final   Organism ID, Bacteria METHICILLIN RESISTANT STAPHYLOCOCCUS AUREUS  Final      Susceptibility   Methicillin resistant staphylococcus aureus - MIC*    CIPROFLOXACIN >=8 RESISTANT Resistant     ERYTHROMYCIN >=8 RESISTANT Resistant     GENTAMICIN <=0.5 SENSITIVE Sensitive     OXACILLIN >=4 RESISTANT Resistant     TETRACYCLINE <=1 SENSITIVE Sensitive     VANCOMYCIN <=0.5 SENSITIVE Sensitive     TRIMETH/SULFA >=320 RESISTANT Resistant     CLINDAMYCIN <=0.25 SENSITIVE Sensitive     RIFAMPIN <=0.5 SENSITIVE Sensitive     Inducible Clindamycin NEGATIVE Sensitive     * METHICILLIN RESISTANT STAPHYLOCOCCUS AUREUS  Culture, blood (Routine X 2) w Reflex to ID Panel     Status: Abnormal   Collection Time: 07/24/22  3:55 PM   Specimen: BLOOD  Result Value Ref Range Status   Specimen Description   Final    BLOOD BLOOD RIGHT ARM Performed at [facility], [address], [place], [place] [address]    Special Requests   Final    AEROBIC BOTTLE ONLY Blood Culture adequate volume Performed at [facility], [address] [place], [place]  [address]    Culture  Setup Time   Final    GRAM POSITIVE COCCI IN CLUSTERS AEROBIC BOTTLE ONLY CRITICAL VALUE NOTED.  VALUE IS CONSISTENT WITH PREVIOUSLY REPORTED AND CALLED VALUE. CRITICAL RESULT CALLED TO, READ BACK BY AND VERIFIED WITH: PHARMD M.BELL AT 1315 ON 07/25/2022 BY T.SAAD.    Culture (A)  Final     STAPHYLOCOCCUS AUREUS SUSCEPTIBILITIES PERFORMED ON PREVIOUS CULTURE WITHIN THE LAST 5 DAYS. Performed at [facility], [address] [place], [place] [address]    Report Status 07/27/2022 FINAL  Final  Blood Culture ID Panel (Reflexed)     Status: Abnormal   Collection Time: 07/24/22  3:55 PM  Result Value Ref Range Status   Enterococcus faecalis NOT DETECTED NOT DETECTED Final   Enterococcus Faecium NOT DETECTED NOT DETECTED Final   Listeria monocytogenes NOT DETECTED NOT DETECTED Final   Staphylococcus species DETECTED (A) NOT DETECTED Final    Comment: CRITICAL RESULT CALLED TO, READ BACK BY AND VERIFIED WITH: PHARMD T. JOHNSON 07/26/22 @ 0016 BY AB    Staphylococcus aureus (BCID) DETECTED (A) NOT DETECTED Final    Comment: Methicillin (oxacillin)-resistant Staphylococcus aureus (MRSA). MRSA is predictably resistant to beta-lactam antibiotics (except ceftaroline). Preferred therapy is vancomycin unless clinically contraindicated. Patient requires contact precautions if  hospitalized. CRITICAL RESULT CALLED TO, READ BACK BY AND VERIFIED WITH: PHARMD T. JOHNSON 07/26/22 @ 0016 BY AB    Staphylococcus epidermidis NOT DETECTED NOT DETECTED Final   Staphylococcus lugdunensis NOT DETECTED NOT DETECTED Final   Streptococcus species NOT DETECTED NOT DETECTED Final   Streptococcus agalactiae NOT DETECTED NOT DETECTED Final   Streptococcus pneumoniae NOT DETECTED NOT DETECTED Final   Streptococcus pyogenes NOT DETECTED NOT DETECTED Final   A.calcoaceticus-baumannii NOT DETECTED NOT DETECTED Final   Bacteroides fragilis NOT DETECTED NOT DETECTED Final   Enterobacterales NOT DETECTED NOT DETECTED Final   Enterobacter cloacae complex NOT DETECTED NOT DETECTED Final   Escherichia coli NOT DETECTED NOT DETECTED Final   Klebsiella aerogenes NOT DETECTED NOT DETECTED Final   Klebsiella oxytoca NOT DETECTED NOT DETECTED Final   Klebsiella pneumoniae NOT DETECTED NOT DETECTED Final    Proteus species NOT DETECTED NOT DETECTED Final   Salmonella species NOT DETECTED NOT DETECTED Final   Serratia marcescens NOT DETECTED NOT DETECTED Final   Haemophilus influenzae NOT DETECTED NOT DETECTED Final   Neisseria meningitidis NOT DETECTED NOT DETECTED Final   Pseudomonas aeruginosa NOT DETECTED NOT DETECTED Final   Stenotrophomonas maltophilia NOT DETECTED NOT DETECTED Final   Candida albicans NOT DETECTED NOT DETECTED Final   Candida auris NOT DETECTED NOT DETECTED Final   Candida glabrata NOT DETECTED NOT DETECTED Final   Candida krusei NOT DETECTED NOT DETECTED Final   Candida parapsilosis NOT DETECTED NOT DETECTED Final   Candida tropicalis NOT DETECTED NOT DETECTED Final   Cryptococcus neoformans/gattii NOT DETECTED NOT DETECTED Final   Meth resistant mecA/C and MREJ DETECTED (A) NOT DETECTED Final    Comment: CRITICAL RESULT CALLED TO, READ BACK BY AND VERIFIED WITH: PHARMD T. JOHNSON 07/26/22 @ 0016 BY AB Performed at [facility], [address] [place], [place] [address]   Culture, blood (Routine X 2) w Reflex to ID Panel     Status: None   Collection Time: 07/27/22 12:15 PM   Specimen: BLOOD  Result Value Ref Range Status   Specimen Description BLOOD RIGHT ANTECUBITAL  Final   Special Requests   Final    BOTTLES DRAWN AEROBIC  AND ANAEROBIC Blood Culture adequate volume   Culture   Final    NO GROWTH 5 DAYS Performed at [facility], [address], [place], [place] [address]    Report Status 08/01/2022 FINAL  Final  Culture, blood (Routine X 2) w Reflex to ID Panel     Status: None   Collection Time: 07/27/22 12:15 PM   Specimen: BLOOD  Result Value Ref Range Status   Specimen Description BLOOD RIGHT ANTECUBITAL  Final   Special Requests   Final    BOTTLES DRAWN AEROBIC AND ANAEROBIC Blood Culture adequate volume   Culture   Final    NO GROWTH 5 DAYS Performed at [facility], [address], [place], [place] [address]    Report Status  08/01/2022 FINAL  Final  Aerobic/Anaerobic Culture w Gram Stain (surgical/deep wound)     Status: None   Collection Time: 07/27/22  5:04 PM   Specimen: Abscess  Result Value Ref Range Status   Specimen Description ABSCESS CHEST RIGHT  Final   Special Requests DRAIN  Final   Gram Stain NO WBC SEEN NO ORGANISMS SEEN   Final   Culture   Final    No growth aerobically or anaerobically. Performed at [facility], [address] [place], [place] [address]    Report Status 08/01/2022 FINAL  Final  MRSA Next Gen by PCR, Nasal     Status: Abnormal   Collection Time: 08/01/22 11:18 PM   Specimen: Nasal Mucosa; Nasal Swab  Result Value Ref Range Status   MRSA by PCR Next Gen DETECTED (A) NOT DETECTED Final    Comment: RESULTS CALLED TO, READ BACK BY AND VERIFIED WITH RN K.DAVIDSON ON 08/02/22 AT 0117 BY NM (NOTE) The GeneXpert MRSA Assay (FDA approved for NASAL specimens only), is one component of a comprehensive MRSA colonization surveillance program. It is not intended to diagnose MRSA infection nor to guide or monitor treatment for MRSA infections. Test performance is not FDA approved in patients less than 25 years old. Performed at [facility], [address], [place], [place] [address]   Aerobic/Anaerobic Culture w Gram Stain (surgical/deep wound)     Status: None   Collection Time: 08/02/22 12:16 PM   Specimen: PATH Bone biopsy; Tissue  Result Value Ref Range Status   Specimen Description BONE  Final   Special Requests NONE  Final   Gram Stain NO WBC SEEN RARE GRAM NEGATIVE COCCI IN PAIRS   Final   Culture   Final    FEW METHICILLIN RESISTANT STAPHYLOCOCCUS AUREUS MODERATE ACTINOMYCES SPECIES Standardized susceptibility testing for this organism is not available. NO ANAEROBES ISOLATED Performed at [facility], [address], [place], [place] [address]    Report Status 08/07/2022 FINAL  Final   Organism ID, Bacteria METHICILLIN RESISTANT STAPHYLOCOCCUS  AUREUS  Final      Susceptibility   Methicillin resistant staphylococcus aureus - MIC*    CIPROFLOXACIN >=8 RESISTANT Resistant     ERYTHROMYCIN >=8 RESISTANT Resistant     GENTAMICIN <=0.5 SENSITIVE Sensitive     OXACILLIN >=4 RESISTANT Resistant     TETRACYCLINE <=1 SENSITIVE Sensitive     VANCOMYCIN <=0.5 SENSITIVE Sensitive     TRIMETH/SULFA >=320 RESISTANT Resistant     CLINDAMYCIN <=0.25 SENSITIVE Sensitive     RIFAMPIN <=0.5 SENSITIVE Sensitive     Inducible Clindamycin NEGATIVE Sensitive     LINEZOLID 2 SENSITIVE Sensitive     *  FEW METHICILLIN RESISTANT STAPHYLOCOCCUS AUREUS  Aerobic/Anaerobic Culture w Gram Stain (surgical/deep wound)     Status: None   Collection Time: 08/02/22 12:23 PM   Specimen: PATH Bone biopsy; Tissue  Result Value Ref Range Status   Specimen Description WOUND  Final   Special Requests NONE  Final   Gram Stain NO WBC SEEN RARE GRAM POSITIVE COCCI   Final   Culture   Final    FEW METHICILLIN RESISTANT STAPHYLOCOCCUS AUREUS NO ANAEROBES ISOLATED Performed at [facility], [address] [place], [place] [address]    Report Status 08/07/2022 FINAL  Final   Organism ID, Bacteria METHICILLIN RESISTANT STAPHYLOCOCCUS AUREUS  Final      Susceptibility   Methicillin resistant staphylococcus aureus - MIC*    CIPROFLOXACIN >=8 RESISTANT Resistant     ERYTHROMYCIN >=8 RESISTANT Resistant     GENTAMICIN <=0.5 SENSITIVE Sensitive     OXACILLIN >=4 RESISTANT Resistant     TETRACYCLINE <=1 SENSITIVE Sensitive     VANCOMYCIN 1 SENSITIVE Sensitive     TRIMETH/SULFA >=320 RESISTANT Resistant     CLINDAMYCIN <=0.25 SENSITIVE Sensitive     RIFAMPIN <=0.5 SENSITIVE Sensitive     Inducible Clindamycin NEGATIVE Sensitive     LINEZOLID 2 SENSITIVE Sensitive     * FEW METHICILLIN RESISTANT STAPHYLOCOCCUS AUREUS    Studies/Results: MR THORACIC SPINE WO CONTRAST  Result Date: 08/08/2022 CLINICAL DATA:  Osteomyelitis. EXAM: MRI THORACIC SPINE WITHOUT  CONTRAST TECHNIQUE: Multiplanar, multisequence MR imaging of the thoracic spine was performed. No intravenous contrast was administered. COMPARISON:  Thoracic spine MRI 07/25/2022. FINDINGS: Alignment: There is mild exaggeration of the normal thoracic lordosis. There is no significant antero or retrolisthesis. Vertebrae: Background marrow signal is normal There is confluent T1 hypointensity and STIR hyperintensity in the T7 through T9 vertebral bodies with relative sparing of the intervening T7-T8 disc space concerning for persistent discitis/osteomyelitis, progressed involving the T7 vertebral body compared to the prior study. There is thin T2 hyperintensity along the ventral aspect of the thecal sac at T7-T8 likely reflecting phlegmon and possible abscess. Additionally, there is mild compression deformity of the T8 vertebral body with 3-4 mm retropulsion of the posterior endplate which is new since the prior study consistent with interval pathologic fracture. The other thoracic vertebral body heights are preserved. A benign intraosseous hemangioma in the T12 vertebral body is unchanged. Compression deformity of the L4 vertebral body seen on the sagittal count sequence is unchanged compared to the prior lumbar spine MRI. Cord:  Normal in signal and morphology. Paraspinal and other soft tissues: There is complex right pleural effusion, incompletely imaged but at least somewhat decreased in size compared to the prior MRI. There is smaller layering left pleural effusion. A lobulated fluid collection in the paraspinal soft tissues at T7-T9 is again seen, overall decreased in size compared to the MRI. On the current study, the right aspect of the collection measures up to approximately 4.4 cm AP x 4.6 cm cc in the collection in total measures up to 5.2 cm TV. Disc levels: There is mild spinal canal stenosis at T8 due to the bony retropulsion and epidural phlegmon. There is overall mild background degenerative change  without other significant spinal canal or neural foraminal stenosis. IMPRESSION: 1. 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 2. Thin epidural phlegmon/abscess along the posterior T7 and T8 endplates. 3. New mild pathologic compression deformity of  the T8 vertebral body with 3-4 mm retropulsion of the posterior endplate which combined with the phlegmon/absces results in mild spinal canal stenosis. 4. Interval decrease in size of the lobulated fluid collection in the paraspinal soft tissues at T7-T9. 5. Complex right pleural effusion, incompletely imaged but at least somewhat decreased in size compared to the prior MRI. Electronically Signed   By: Lesia Hausen M.D.   On: 08/08/2022 15:54      Assessment/Plan:  INTERVAL HISTORY:   Showing persistent disc is osteomyelitis T7 T9 progressive involvement T7 vertebral body with an phlegmon abscess along the T7-T8 endplates and new pathologic compression deformity at T8 vertebral body with retropulsion of the posterior endplate with combined phlegmon abscess with mild canal stenosis, and decrease in size of lobulated paraspinal fluid in the soft tissues from T7-T9 and complex right pleural effusion visualized   Principal Problem:   MRSA bacteremia Active Problems:   Essential hypertension   Insulin dependent type 2 diabetes mellitus (HCC)   Atrial fibrillation with RVR (HCC)   Sleep apnea suspected   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 male with   with admission with MRSA bacteremia due to osteomyelitis of the foot, with seeding also of T spine with discitis osteomyelitis at T8-T9 with a loculated abscess anterior to T7-T9 vertebral bodies concerning for multiloculated prevertebral abscess along with enhancing material along the anterior aspect of thecal sac posterior T8 and T9 concerning for epidural phlegmon with large right pleural empyema  He is now sp 5th ray amputation  I am concerned by his worsening back pain despite appropriate antibiotics  His MRI looks worse (see above)  Dr. Yetta Barre does not see indication for Neurosurgical intervention at this point  I like to continue IV antibiotics for at least 10 days if not 2 weeks further prior to considering switching to pills.  Does need to have close monitoring of his neurological status.  I have personally spent 54 minutes involved in face-to-face and non-face-to-face activities for this patient on the day of the visit. Professional time spent includes the following activities: Preparing to see the patient (review of tests), Obtaining and/or reviewing separately obtained history (admission/discharge record), Performing a medically appropriate examination and/or evaluation , Ordering medications/tests/procedures, referring and communicating with other health care professionals, Documenting clinical information in the EMR, Independently interpreting results (not separately reported), Communicating results to the patient/family/caregiver, Counseling and educating the patient/family/caregiver and Care coordination (not separately reported).   We will follow peripherally at this point.  Please call us if his condition changes.   LOS: 28 days   Acey Lav 08/09/2022, 11:30 AM

## 2022-08-10 LAB — COMPREHENSIVE METABOLIC PANEL
ALT: 13 U/L (ref 0–44)
AST: 20 U/L (ref 15–41)
Albumin: 2.1 g/dL — ABNORMAL LOW (ref 3.5–5.0)
Alkaline Phosphatase: 94 U/L (ref 38–126)
Anion gap: 11 (ref 5–15)
BUN: 25 mg/dL — ABNORMAL HIGH (ref 6–20)
CO2: 23 mmol/L (ref 22–32)
Calcium: 9.4 mg/dL (ref 8.9–10.3)
Chloride: 93 mmol/L — ABNORMAL LOW (ref 98–111)
Creatinine, Ser: 1.56 mg/dL — ABNORMAL HIGH (ref 0.61–1.24)
GFR, Estimated: 51 mL/min — ABNORMAL LOW (ref >60)
Glucose, Bld: 246 mg/dL — ABNORMAL HIGH (ref 70–99)
Potassium: 4.4 mmol/L (ref 3.5–5.1)
Sodium: 127 mmol/L — ABNORMAL LOW (ref 135–145)
Total Bilirubin: 0.9 mg/dL (ref 0.3–1.2)
Total Protein: 7 g/dL (ref 6.5–8.1)

## 2022-08-10 LAB — VANCOMYCIN, TROUGH: Vancomycin Tr: 38 ug/mL (ref 15–20)

## 2022-08-10 LAB — GLUCOSE, CAPILLARY
Glucose-Capillary: 249 mg/dL — ABNORMAL HIGH (ref 70–99)
Glucose-Capillary: 135 mg/dL — ABNORMAL HIGH (ref 70–99)
Glucose-Capillary: 69 mg/dL — ABNORMAL LOW (ref 70–99)
Glucose-Capillary: 239 mg/dL — ABNORMAL HIGH (ref 70–99)

## 2022-08-10 LAB — CBC
HCT: 28.7 % — ABNORMAL LOW (ref 39.0–52.0)
Hemoglobin: 9.7 g/dL — ABNORMAL LOW (ref 13.0–17.0)
MCH: 29.3 pg (ref 26.0–34.0)
MCHC: 33.8 g/dL (ref 30.0–36.0)
MCV: 86.7 fL (ref 80.0–100.0)
Platelets: 401 10*3/uL — ABNORMAL HIGH (ref 150–400)
RBC: 3.31 MIL/uL — ABNORMAL LOW (ref 4.22–5.81)
RDW: 14.4 % (ref 11.5–15.5)
WBC: 11.2 10*3/uL — ABNORMAL HIGH (ref 4.0–10.5)
nRBC: 0 % (ref 0.0–0.2)

## 2022-08-10 LAB — VANCOMYCIN, PEAK: Vancomycin Pk: 58 ug/mL (ref 30–40)

## 2022-08-10 MED ORDER — VANCOMYCIN HCL 750 MG/150ML IV SOLN: 750.0000 mg | INTRAVENOUS | Status: DC

## 2022-08-10 MED ORDER — SODIUM CHLORIDE 0.9 % IV SOLN: INTRAVENOUS | Status: DC | PRN

## 2022-08-10 MED ORDER — SODIUM CHLORIDE 0.9 % IV SOLN: INTRAVENOUS | Status: DC

## 2022-08-10 NOTE — Progress Notes (Signed)
David [name] ZOX:[id number] DOB: 06/19/1960 DOA: 07/12/2022 PCP: [name], MD   Subj: 62 yo WM PMHx of chronic combined CHF, chronic A-fib on Eliquis, type II DM, prior amputation of right foot, HTN. Present to the hospital with complaints of fever. Found to have MRSA bacteremia as well as multiple abscesses. Originally admitted at [facility] on 4/9. Blood cultures came positive for MRSA    Obj: A/O x 4, positive back pain rated 9/10 just received pain medication.  States whenever he moves positive SOB attributes this to his  severe back pain.  Denies any RIGHT foot pain.  Patient well-known to [facility], patient refuses to obtain health insurance through his work, does not qualify for Medicare/Medicaid, refuses to obtain outside healthcare.  Refuses to pay his previous bills therefore healthcare agencies will not except requests for home health care needs i.e. IV antibiotics etc.   Objective: VITAL SIGNS: Temp: 97.3 F (36.3 C) (05/08 0724) Temp Source: Oral (05/08 0724) BP: 134/73 (05/08 0724) Pulse Rate: 77 (05/08 0724)   VENTILATOR SETTINGS: **  Procedures/Significant Events:  4/9.  Blood cultures came positive for MRSA 4/10 ID was consulted. 4/12 TTE was negative, TEE was done which was negative for vegetation 4/18 nephrology consulted for AKI. 4/22 neurosurgery [facility]/ Dr Yetta Barre were consulted. Recommend medical management with IV antibiotics only without any intervention. Neurosurgery was consulted and recommend conservative measures. 4/23 Dr. [name] was consulted for amputation.  Vascular surgery was consulted for concerns for PVD. 4/24 IR placed right-sided chest tube for pleural effusion 4/25 underwent right below-knee popliteal artery, tibioperoneal trunk and peroneal angioplasty with Dr. Chestine Spore. 4/26 IR remove the chest tube.  Follow-up chest x-ray still shows pleural effusions. 4/27 patient currently does not want to perform the procedure with Dr. [name].  Informed  Dr. [name] as well as consulted Dr. Linna Caprice. 4/29: Patient agreeable to proceed with amputation surgery with orthopedic surgeon Dr. Odis Hollingshead 4/30: Patient underwent right fifth ray revision amputation.   Consultants:     Cultures   Antimicrobials: Anti-infectives (From admission, onward)    Start     Ordered Stop   08/04/22 1000  vancomycin (VANCOREADY) IVPB 1250 mg/250 mL  Status:  Discontinued        08/03/22 1150 08/03/22 2212   08/04/22 1000  vancomycin (VANCOCIN) IVPB 1000 mg/200 mL premix        08/04/22 0026     08/03/22 2300  vancomycin (VANCOCIN) IVPB 1000 mg/200 mL premix        08/03/22 2212 08/04/22 1038   08/03/22 0400  ceFAZolin (ANCEF) IVPB 2g/100 mL premix  Status:  Discontinued        08/02/22 2038 08/08/22 1302   08/02/22 2000  ceFAZolin (ANCEF) IVPB 1 g/50 mL premix  Status:  Discontinued        08/02/22 1847 08/02/22 2038   08/02/22 1226  vancomycin (VANCOCIN) powder  Status:  Discontinued        08/02/22 1250 08/02/22 1251   07/26/22 2000  vancomycin (VANCOCIN) IVPB 1000 mg/200 mL premix  Status:  Discontinued        07/26/22 1054 08/03/22 1150   07/24/22 1845  DAPTOmycin (CUBICIN) 650 mg in sodium chloride 0.9 % IVPB  Status:  Discontinued        07/24/22 1759 07/26/22 1050   07/18/22 1500  Oritavancin Diphosphate (ORBACTIV) 1,200 mg in dextrose 5 % IVPB        07/18/22 1351 07/18/22 1822   07/16/22 0000  daptomycin (  CUBICIN) IVPB  Status:  Discontinued        07/16/22 1116 07/19/22    07/15/22 2000  DAPTOmycin (CUBICIN) 650 mg in sodium chloride 0.9 % IVPB  Status:  Discontinued        07/15/22 1411 07/15/22 1413   07/15/22 2000  DAPTOmycin (CUBICIN) 650 mg in sodium chloride 0.9 % IVPB  Status:  Discontinued        07/15/22 1413 07/18/22 1351   07/14/22 0200  vancomycin (VANCOREADY) IVPB 1750 mg/350 mL  Status:  Discontinued        07/13/22 0718 07/15/22 1411   07/13/22 1000  cefTRIAXone (ROCEPHIN) 1 g in sodium chloride 0.9 % 100 mL IVPB  Status:   Discontinued        07/12/22 1405 07/13/22 0239   07/13/22 1000  azithromycin (ZITHROMAX) 500 mg in sodium chloride 0.9 % 250 mL IVPB  Status:  Discontinued        07/12/22 1405 07/13/22 0239   07/13/22 0330  vancomycin (VANCOREADY) IVPB 1750 mg/350 mL        07/13/22 0240 07/13/22 0517   07/12/22 0845  cefTRIAXone (ROCEPHIN) 1 g in sodium chloride 0.9 % 100 mL IVPB        07/12/22 0844 07/12/22 1218   07/12/22 0845  azithromycin (ZITHROMAX) 500 mg in sodium chloride 0.9 % 250 mL IVPB        07/12/22 0844 07/12/22 1218        Intake/Output Summary (Last 24 hours) at 08/10/2022 0825 Last data filed at 08/10/2022 0500 Gross per 24 hour  Intake 400 ml  Output 625 ml  Net -225 ml     Exam: Physical Exam:  General: A/O x 4, No acute respiratory distress Eyes: negative scleral hemorrhage, negative anisocoria, negative icterus ENT: Negative Runny nose, negative gingival bleeding, Neck:  Negative scars, masses, torticollis, lymphadenopathy, JVD Lungs: Clear to auscultation bilaterally without wheezes or crackles Cardiovascular: Regular rate and rhythm without murmur gallop or rub normal S1 and S2 Abdomen: negative abdominal pain, nondistended, positive soft, bowel sounds, no rebound, no ascites, no appreciable mass Extremities: RIGHT foot: Fourth and fifth metatarsal amputation covered and clean, negative pain to palpation at surgical site. Skin: Negative rashes, lesions, ulcers Psychiatric:  Negative depression, negative anxiety, negative fatigue, negative mania  Central nervous system:  Cranial nerves II through XII intact, tongue/uvula midline, all extremities muscle strength 5/5, sensation intact throughout, negative dysarthria, negative expressive aphasia, negative receptive aphasia.   .   DVT prophylaxis: Eliquis Code Status: Full Family Communication:  Status is: Inpatient    Dispo: The patient is from: Home              Anticipated d/c is to: ?????               Anticipated d/c date is: > 3 days              Patient currently is not medically stable to d/c.      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 -Meeting SIRS criteria on admission with tachycardia and tachypnea. -Blood cultures positive MRSA  -ID consulted - 4/13 PICC line placed.  Removed 4/25 - Repeat blood cultures negative - Echocardiogram/TEE negative for vegetation - 4/21 repeat blood culture positive for MRSA -4/30 bone positive MRSA - 4/30 wound positive MRSA -Continue current dose of vanc for now. Per ID, cont IV abx for 2 weeks, prior to switching to PO meds   Bilateral multifocal pneumonia/ Right-sided loculated possible parapneumonic effusion. -Pulmonary consulted recommended IR guided chest tube. - 4/24 chest tube placed: 200 mL blood-tinged fluid aspirated - 4/26 PCXR persistent loculated RIGHT pleural effusion with atelectasis - Post removal chest tube RIGHT pleural effusion unchanged.Marland Kitchen   Hx amputation of fifth and fourth toe -Positive MRSA acute osteomyelitis of residual fifth metatarsal diaphysis with soft tissue infection with sinus tract -4/30 orthopedic surgery  recommended right fifth ray revision amputation, done 4/30    T8-T9 discitis and osteomyelitis/Prevertebral abscess at T7-T9 vertebral bodies/ Epidural phlegmon at T8 and T9 thecal sac -4/10 T-spine/L-spine MRI  did not show any evidence of active infection. -4/20 2 repeat MRI positive evidence of osteomyelitis discitis as well as abscess. -Neurosurgery Dr. Yetta Barre was consulted, recommended no surgical intervention. -5/6 repeat MRI secondary to increasing back pain showed worsening infection -Per Neurosurgery, no surgical intervention is warranted. -ID recommends to continue abx per above -5/8 patient to continue vancomycin with end date 5/11.  Reconsult ID appropriate discharge antibiotics.   Duodenitis  seen on admission CT abdomen. Currently no abdominal symptoms. Cont to monitor -5/8 negative pain to palpation, negative nausea, negative vomiting.  Tolerating diet.   Pain control. -Per patient back pain not adequately controlled on current regimen. - 5/8 we will continue to adjust medication    Peripheral vascular disease with critical limb ischemia. -Vascular surgery was consulted by Dr. [name] -4/25 s/p Angiogram with angioplasty of RIGHT below-knee popliteal artery, tibioperoneal trunk, and peroneal artery   -Continue aspirin Plavix and statin.   Hypervolemic hyponatremia.  (Baseline Na upper 120s) -Nephrology had seen earlier.  Pt s/p tolvaptan without any response.  Later on responded well to diuresis. -Per Nephrology, baseline Na is likely in the mid to upper 120's -5/8 hold fluid restriction and Lasix 40 mg daily see worsening CKD Lab Results  Component Value Date   NA 127 (L) 08/10/2022   NA 127 (L) 08/09/2022   NA 126 (L) 08/08/2022   NA 126 (L) 08/07/2022   NA 126 (L) 08/06/2022     AKI on CKD 3A.  (Baseline  Cr1.1) - On admission Cr= 1.4. -Will liberalize fluid restriction to 1500cc Lab Results  Component Value Date   CREATININE 1.56 (H) 08/10/2022   CREATININE 1.66 (H) 08/09/2022   CREATININE 1.62 (H) 08/08/2022    CREATININE 1.56 (H) 08/07/2022   CREATININE 1.24 08/06/2022   Acute on chronic combined CHF. -Echocardiogram currently 60-65%.  No wall motion abnormality.   -Entresto on hold given renal function -Cont aspirin and Plavix. -Strict ins and out -12.8 L - Daily weight   Chronic A-fib. -5/8 NSR - Eliquis 5 mg BID - see essential HTN  Essential HTN -Coreg 3.125 mg BID -Digoxin 125 mcg daily -.Labetalol PRN -Spironolactone 25 mg daily  Oral thrush. -Resolved    Type of diabetes mellitus uncontrolled with hyperglycemia with long-term insulin use. -4/10 hemoglobin A1c= 9.1 -Semglee 35 units daily - NovoLog 3 units qac -Resistant SSI CBG (last 3)  Recent Labs    08/10/22 0634 08/10/22 1138 08/10/22 1627  GLUCAP 249* 135* 69*     Adult failure to thrive. -No evidence of protein-calorie malnutrition  -Continue supplementation. -Chronic infection with noncompliance with treatment regimen     Anemia of chronic disease -In the setting of osteomyelitis. -5/8 anemia panel pending - 5/8 occult blood pending Lab Results  Component Value Date   HGB 9.7 (L) 08/10/2022   HGB 10.8 (L) 08/09/2022   HGB 11.0 (L) 08/08/2022   HGB 9.2 (L) 08/07/2022   HGB 9.1 (L) 08/06/2022      Anxiety. -Buspirone 15 mg TID -Cont Lyrica. -Continue Atarax as needed.  Hypomagnesemia: -Magnesium goal> 2    Goals of care - 5/8 palliative care consult:patient refuses to obtain health insurance through his work, does not qualify for Medicare/Medicaid, refuses to obtain outside healthcare.  Refuses to pay his previous bills therefore healthcare agencies will not except requests for home health care needs i.e. IV antibiotics etc. evaluate for change of CODE STATUS to DNR, evaluate for home hospice     Mobility Assessment (last 72 hours)     Mobility Assessment     Row Name 08/09/22 0840 08/08/22 2000         Does patient have an order for bedrest or is patient medically unstable No -  Continue assessment No - Continue assessment      What is the highest level of mobility based on the progressive mobility assessment? Level 5 (Walks with assist in room/hall) - Balance while stepping forward/back and can walk in room with assist - Complete Level 5 (Walks with assist in room/hall) - Balance while stepping forward/back and can walk in room with assist - Complete      Is the above level different from baseline mobility prior to current illness? Yes - Recommend PT order Yes - Recommend PT order                    Time: 50 minutes         Care during the described time interval was provided by me .  I have reviewed this patient's available data, including medical history, events of note, physical examination, and all test results as part of my evaluation.

## 2022-08-10 NOTE — Progress Notes (Signed)
Pharmacy Antibiotic Note  David [name] is a 62 y.o. male admitted on 07/12/2022 with a disseminated MRSA infection - bacteremia/osteo/empyema. Pharmacy has been consulted for Vancomycin dosing.  ID following and plans to continue IV antibiotics for 10-14 days.  -vancomycin peak= 58 (2 hours post dose) -SCr= 1.56  Plan: -Hold further vancomycin dosing for now -Check a vancomycin level later today to help determine kinetics     Height: 6' (182.9 cm) Weight: 80.9 kg (178 lb 5.6 oz) IBW/kg (Calculated) : 77.6  Temp (24hrs), Avg:97.7 F (36.5 C), Min:97.3 F (36.3 C), Max:98.1 F (36.7 C)  Recent Labs  Lab 08/03/22 2021 08/03/22 2231 08/06/22 0137 08/07/22 0111 08/08/22 0133 08/09/22 0135 08/10/22 0121 08/10/22 1207  WBC  --    < > 11.3* 12.0* 14.6* 12.3* 11.2*  --   CREATININE  --    < > 1.24 1.56* 1.62* 1.66* 1.56*  --   VANCOPEAK  --   --   --   --   --   --   --  58*  VANCORANDOM 13  --   --   --   --   --   --   --    < > = values in this interval not displayed.     Estimated Creatinine Clearance: 55.3 mL/min (A) (by C-G formula based on SCr of 1.56 mg/dL (H)).    No Known Allergies  Antimicrobials this admission: CTX/Azithro 4/9 >> 4/10 Vancomycin 4/10 >>4/12; 4/23 >>  Daptomycin 4/12 >> 4/15; 4/21>>422 Oritavancin x 1 on 4/15 Nystatin 4/15 >>4/30  Bactroban nasal 4/30>>(5/3) Cefazolin 4/30 >> 5/6   Microbiology results: 4/9 blood: 4/4 MRSA (susc sent out to [facility] for Dapto) 4/9 respiratory panel: neg 4/11 blood: NGF 4/21 blood: 2/2 MRSA (S: gent, TCN, vanc (MIC <0.5), clinda, rifampin) 4/24 blood: neg 4/24 Empyema: neg 4/29 MRSA PCR: positive 4/30 bone (bone biopsy): few Staph aureus - ( S: gent, tetracycline, vanc (MIC <0.5), clinda, rifampin, linezolid) 4/30 wound (bone biopsy): few Staph aureus - ( S: gent, tetracycline, vanc (MIC <0.5), clinda, rifampin, linezolid)  Thank you for allowing pharmacy to be a part of this patient's care.

## 2022-08-10 NOTE — Progress Notes (Signed)
New patient transferred from [address]. Patient is a/o x4. MAE's x 4.Patient has a dressing to right foot from amputation. Patient denies complaints at this time. Patient oriented to room and call bell. Patient denies questions.

## 2022-08-10 NOTE — Progress Notes (Signed)
Pharmacy Antibiotic Note  David [name] is a 62 y.o. male admitted on 07/12/2022 with a disseminated MRSA infection - bacteremia/osteo/empyema. Pharmacy has been consulted for Vancomycin dosing.  ID following and plans to continue IV antibiotics for 10-14 days.   5/8 1207 Vanc peak 58 (2 hrs post dose) 5/8 2050 Vanc trough 38   Calculated AUC 1151 (goal 400-550) Half-life 14.3 hr Ke 0.0485  Plan: Change vancomycin to 750mg  IV q24h - next dose ~24 hours post level of 38. (calc AUC 431, calc Cmax 29.7, calc Cmin 9.7) F/u Scr in a.m, may need to consider f/u random vanc level 5/9 pm to help guide when to start dosing.   Height: 6' (182.9 cm) Weight: 80.9 kg (178 lb 5.6 oz) IBW/kg (Calculated) : 77.6  Temp (24hrs), Avg:97.7 F (36.5 C), Min:97.3 F (36.3 C), Max:98.3 F (36.8 C)  Recent Labs  Lab 08/06/22 0137 08/07/22 0111 08/08/22 0133 08/09/22 0135 08/10/22 0121 08/10/22 1207 08/10/22 2050  WBC 11.3* 12.0* 14.6* 12.3* 11.2*  --   --   CREATININE 1.24 1.56* 1.62* 1.66* 1.56*  --   --   VANCOTROUGH  --   --   --   --   --   --  38*  VANCOPEAK  --   --   --   --   --  58*  --      Estimated Creatinine Clearance: 55.3 mL/min (A) (by C-G formula based on SCr of 1.56 mg/dL (H)).    No Known Allergies  Antimicrobials this admission: CTX/Azithro 4/9 >> 4/10 Vancomycin 4/10 >>4/12; 4/23 >>  Daptomycin 4/12 >> 4/15; 4/21>>422 Oritavancin x 1 on 4/15 Nystatin 4/15 >>4/30  Bactroban nasal 4/30>>(5/3) Cefazolin 4/30 >> 5/6   Microbiology results: 4/9 blood: 4/4 MRSA (susc sent out to [facility] for Dapto) 4/9 respiratory panel: neg 4/11 blood: NGF 4/21 blood: 2/2 MRSA (S: gent, TCN, vanc (MIC <0.5), clinda, rifampin) 4/24 blood: neg 4/24 Empyema: neg 4/29 MRSA PCR: positive 4/30 bone (bone biopsy): few Staph aureus - ( S: gent, tetracycline, vanc (MIC <0.5), clinda, rifampin, linezolid) 4/30 wound (bone biopsy): few Staph aureus - ( S: gent, tetracycline, vanc (MIC <0.5),  clinda, rifampin, linezolid)  Thank you for allowing pharmacy to be a part of this patient's care.  Christoper Fabian, PharmD, BCPS Please see amion for complete clinical pharmacist phone list 08/10/2022 10:19 PM

## 2022-08-11 ENCOUNTER — Inpatient Hospital Stay (HOSPITAL_COMMUNITY): Payer: Medicaid Other

## 2022-08-11 LAB — COMPREHENSIVE METABOLIC PANEL
ALT: 11 U/L (ref 0–44)
AST: 14 U/L — ABNORMAL LOW (ref 15–41)
Albumin: 2 g/dL — ABNORMAL LOW (ref 3.5–5.0)
Alkaline Phosphatase: 91 U/L (ref 38–126)
Anion gap: 9 (ref 5–15)
BUN: 28 mg/dL — ABNORMAL HIGH (ref 6–20)
CO2: 23 mmol/L (ref 22–32)
Calcium: 9.1 mg/dL (ref 8.9–10.3)
Chloride: 94 mmol/L — ABNORMAL LOW (ref 98–111)
Creatinine, Ser: 1.81 mg/dL — ABNORMAL HIGH (ref 0.61–1.24)
GFR, Estimated: 42 mL/min — ABNORMAL LOW (ref >60)
Glucose, Bld: 201 mg/dL — ABNORMAL HIGH (ref 70–99)
Potassium: 4.8 mmol/L (ref 3.5–5.1)
Sodium: 126 mmol/L — ABNORMAL LOW (ref 135–145)
Total Bilirubin: 0.8 mg/dL (ref 0.3–1.2)
Total Protein: 6.9 g/dL (ref 6.5–8.1)

## 2022-08-11 LAB — GLUCOSE, CAPILLARY
Glucose-Capillary: 190 mg/dL — ABNORMAL HIGH (ref 70–99)
Glucose-Capillary: 164 mg/dL — ABNORMAL HIGH (ref 70–99)
Glucose-Capillary: 93 mg/dL (ref 70–99)
Glucose-Capillary: 121 mg/dL — ABNORMAL HIGH (ref 70–99)

## 2022-08-11 LAB — CBC WITH DIFFERENTIAL/PLATELET
Abs Immature Granulocytes: 0.1 10*3/uL — ABNORMAL HIGH (ref 0.00–0.07)
Basophils Absolute: 0.1 10*3/uL (ref 0.0–0.1)
Basophils Relative: 1 %
Eosinophils Absolute: 0.2 10*3/uL (ref 0.0–0.5)
Eosinophils Relative: 1 %
HCT: 26.3 % — ABNORMAL LOW (ref 39.0–52.0)
Hemoglobin: 8.9 g/dL — ABNORMAL LOW (ref 13.0–17.0)
Immature Granulocytes: 1 %
Lymphocytes Relative: 9 %
Lymphs Abs: 1.1 10*3/uL (ref 0.7–4.0)
MCH: 29.2 pg (ref 26.0–34.0)
MCHC: 33.8 g/dL (ref 30.0–36.0)
MCV: 86.2 fL (ref 80.0–100.0)
Monocytes Absolute: 1.2 10*3/uL — ABNORMAL HIGH (ref 0.1–1.0)
Monocytes Relative: 10 %
Neutro Abs: 9.5 10*3/uL — ABNORMAL HIGH (ref 1.7–7.7)
Neutrophils Relative %: 78 %
Platelets: 357 10*3/uL (ref 150–400)
RBC: 3.05 MIL/uL — ABNORMAL LOW (ref 4.22–5.81)
RDW: 14.6 % (ref 11.5–15.5)
WBC: 12 10*3/uL — ABNORMAL HIGH (ref 4.0–10.5)
nRBC: 0 % (ref 0.0–0.2)

## 2022-08-11 LAB — IRON AND TIBC
Iron: 12 ug/dL — ABNORMAL LOW (ref 45–182)
Saturation Ratios: 6 % — ABNORMAL LOW (ref 17.9–39.5)
TIBC: 213 ug/dL — ABNORMAL LOW (ref 250–450)
UIBC: 201 ug/dL

## 2022-08-11 LAB — PHOSPHORUS: Phosphorus: 3.5 mg/dL (ref 2.5–4.6)

## 2022-08-11 LAB — C-REACTIVE PROTEIN: CRP: 9.7 mg/dL — ABNORMAL HIGH (ref <1.0)

## 2022-08-11 LAB — OSMOLALITY: Osmolality: 283 mOsm/kg (ref 275–295)

## 2022-08-11 LAB — MAGNESIUM: Magnesium: 1.8 mg/dL (ref 1.7–2.4)

## 2022-08-11 LAB — RETICULOCYTES
Immature Retic Fract: 19.9 % — ABNORMAL HIGH (ref 2.3–15.9)
RBC.: 3.01 MIL/uL — ABNORMAL LOW (ref 4.22–5.81)
Retic Count, Absolute: 76.2 10*3/uL (ref 19.0–186.0)
Retic Ct Pct: 2.5 % (ref 0.4–3.1)

## 2022-08-11 LAB — FERRITIN: Ferritin: 722 ng/mL — ABNORMAL HIGH (ref 24–336)

## 2022-08-11 LAB — URIC ACID: Uric Acid, Serum: 6.2 mg/dL (ref 3.7–8.6)

## 2022-08-11 LAB — PROCALCITONIN: Procalcitonin: 0.12 ng/mL

## 2022-08-11 LAB — VITAMIN B12: Vitamin B-12: 642 pg/mL (ref 180–914)

## 2022-08-11 LAB — FOLATE: Folate: 13.1 ng/mL (ref >5.9)

## 2022-08-11 MED ORDER — SODIUM CHLORIDE 0.9 % IV SOLN
10.0000 mg/kg | Freq: Every day | INTRAVENOUS | Status: DC
  Administered 2022-08-11 – 2022-08-14 (×4): 800 mg via INTRAVENOUS
  Filled 2022-08-11 (×5): qty 16

## 2022-08-11 MED ORDER — DOXYCYCLINE HYCLATE 100 MG PO TABS
100.0000 mg | ORAL_TABLET | Freq: Two times a day (BID) | ORAL | Status: DC
  Administered 2022-08-11 – 2022-08-19 (×16): 100 mg via ORAL
  Filled 2022-08-11 (×16): qty 1

## 2022-08-11 MED ORDER — LACTULOSE 10 GM/15ML PO SOLN
30.0000 g | Freq: Three times a day (TID) | ORAL | Status: DC
  Administered 2022-08-11 – 2022-08-19 (×22): 30 g via ORAL
  Filled 2022-08-11 (×22): qty 60

## 2022-08-11 MED ORDER — DOCUSATE SODIUM 100 MG PO CAPS
200.0000 mg | ORAL_CAPSULE | Freq: Two times a day (BID) | ORAL | Status: DC
  Administered 2022-08-11 – 2022-08-19 (×15): 200 mg via ORAL
  Filled 2022-08-11 (×16): qty 2

## 2022-08-11 MED ORDER — LORAZEPAM 1 MG PO TABS
1.0000 mg | ORAL_TABLET | Freq: Once | ORAL | Status: AC
  Administered 2022-08-11: 1 mg via ORAL
  Filled 2022-08-11: qty 1

## 2022-08-11 MED ORDER — MAGNESIUM HYDROXIDE 400 MG/5ML PO SUSP
30.0000 mL | Freq: Two times a day (BID) | ORAL | Status: AC
  Administered 2022-08-11 (×2): 30 mL via ORAL
  Filled 2022-08-11 (×2): qty 30

## 2022-08-11 MED ORDER — NALOXEGOL OXALATE 25 MG PO TABS
25.0000 mg | ORAL_TABLET | Freq: Every day | ORAL | Status: DC
  Administered 2022-08-11 – 2022-08-19 (×9): 25 mg via ORAL
  Filled 2022-08-11 (×11): qty 1

## 2022-08-11 NOTE — Evaluation (Signed)
Occupational Therapy Evaluation Patient Details Name: David [name] MRN: [id number] DOB: 14-Jul-1960 Today's Date: 08/11/2022   History of Present Illness Pt is a 62 yo male admitted initially admitted to [facility] 4/9 then transfered to [facility]. Pt presented to [facility] ED with complaint of fever and found to have MRSA bacteremia and multiple abscesses, bilateral pneumonia and right-sided parapneumonic effusion, T7-T9 discitis and phlegmon, also had right foot osteomylitis. 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   Clinical Impression   At baseline pt completes ADLs and IADLs Independent to Mod I and was driving. Pt previously used a SPC for functional transfers and functional mobility with Mod I. Pt presents with a decline in general B UE strength, decreased activity tolerance, decreased balance during functional tasks, decreased safety awareness and sequencing ability, and decreased safety and independence with ADLs. Pt currently demonstrates the ability to complete UB ADLs sitting EOB with Supervision to Min assist, LB ADLs sit/stand with Mod to Max assist, bed mobility in preparation for ADLs with Min to Mod assist, and functional transfers with Min to Mod assist with use of RW (2 wheel). Pt currently requires cues through functional tasks for safety. Pt will benefit from acute skilled OT services to address deficits outlined below and increase safety and independence with ADLs, bed mobility in preparation for functional tasks, and functional transfers. Post acute discharge, pt will benefit from continued intensive inpatient skilled OT services >3 hours per day.      Recommendations for follow up therapy are one component of a multi-disciplinary discharge planning process, led by the attending physician.  Recommendations may be updated based on  patient status, additional functional criteria and insurance authorization.   Assistance Recommended at Discharge Frequent or constant Supervision/Assistance  Patient can return home with the following A little help with walking and/or transfers;A lot of help with bathing/dressing/bathroom;Assistance with cooking/housework;Direct supervision/assist for medications management;Assist for transportation;Help with stairs or ramp for entrance    Functional Status Assessment  Patient has had a recent decline in their functional status and demonstrates the ability to make significant improvements in function in a reasonable and predictable amount of time.  Equipment Recommendations  Other (comment) (TBD pending pt progress)    Recommendations for Other Services Rehab consult     Precautions / Restrictions Precautions Precautions: Fall Required Braces or Orthoses: Other Brace Other Brace: TLSO for use when ambulating Restrictions Weight Bearing Restrictions: No      Mobility Bed Mobility Overal bed mobility: Needs Assistance Bed Mobility: Supine to Sit, Sit to Supine     Supine to sit: Min assist, HOB elevated Sit to supine: Mod assist, HOB elevated   General bed mobility comments: Mod assist to scoot up in the bed. Required cues throughout all bed mobility for hand/foot placement and technique. Pt with less anxiety with education on technique first followed by pt signaling readiness to attempt bed mobility.    Transfers Overall transfer level: Needs assistance Equipment used: Rolling walker (2 wheels) Transfers: Sit to/from Stand, Bed to chair/wheelchair/BSC Sit to Stand: Mod assist, From elevated surface     Step pivot transfers: Min assist (with extra time)            Balance Overall balance assessment: Needs assistance (pain affecting posture and balance during functional activities) Sitting-balance support: Single extremity supported, No upper extremity supported, Feet  supported Sitting balance-Leahy Scale: Fair Sitting balance - Comments: Pt with difficulty maintaining sitting posture during tasks secondary to pain, but able to self correct with verbal cue. Postural control: Left lateral lean (Secondary to pain) Standing balance support: Single extremity supported, Bilateral upper extremity supported, During functional activity Standing balance-Leahy Scale: Poor                             ADL either performed or assessed with clinical judgement   ADL Overall ADL's : Needs assistance/impaired Eating/Feeding: Independent   Grooming: Wash/dry hands;Wash/dry face;Oral care;Supervision/safety;Sitting (Decreased sitting balance. Pt can self-correct with cues.)   Upper Body Bathing: Minimal assistance;Sitting;Cueing for safety (with extra time)   Lower Body Bathing: Maximal assistance;Sit to/from stand;Cueing for safety;Cueing for compensatory techniques (with extra time)   Upper Body Dressing : Modified independent;Sitting (with extra time)   Lower Body Dressing: Moderate assistance;Cueing for compensatory techniques;Cueing for safety;Sit to/from stand   Toilet Transfer: Moderate assistance;Cueing for safety;BSC/3in1;Rolling walker (2 wheels) (Mod assist to come to stand, Min assist for step pivot transfer, with extra time)   Toileting- Clothing Manipulation and Hygiene: Moderate assistance;Sit to/from stand;Cueing for compensatory techniques;Cueing for safety         General ADL Comments: Pt requires extra time and cues for safety and compensatory techniques throughout ADLs. Pt functional level is limited by pain and anxiety. Pt with decreased anxiety when first educated about functional transfers and compensatory techniqus and then allowing pt to signal readiness to attempt/participate.     Vision Baseline Vision/History: 1 Wears glasses Ability to See in Adequate Light: 0 Adequate Patient Visual Report: No change from baseline Vision  Assessment?: No apparent visual deficits Additional Comments: Vision corrected well with glasses     Perception     Praxis Praxis Praxis tested?: Within functional limits    Pertinent Vitals/Pain Pain Assessment Pain Assessment: Faces Faces Pain Scale: Hurts whole lot Pain Location: Back, Right flank toward back Pain Descriptors / Indicators: Grimacing, Guarding, Moaning, Constant, Other (Comment) (Worse with movement) Pain Intervention(s): Limited activity within patient's tolerance, Monitored during session, Premedicated before session, Repositioned, Other (comment) (RN applied pain patch during session)     Hand Dominance Right   Extremity/Trunk Assessment Upper Extremity Assessment Upper Extremity Assessment: Generalized weakness   Lower Extremity Assessment Lower Extremity Assessment: Defer to PT evaluation   Cervical / Trunk Assessment Cervical / Trunk Assessment: Other exceptions Cervical / Trunk Exceptions: Pt with significant back pain affecting ability to maintain upright posture. Has TSLO brace to wear during ambulation to assist with pain control per MD order.   Communication Communication Communication: No difficulties   Cognition Arousal/Alertness: Awake/alert, Lethargic (Awake and alert at beginning of session. Lethargic almost immediately after returning the supine at end of session.) Behavior During Therapy: Anxious Overall Cognitive Status: Impaired/Different from baseline Area of Impairment: Memory, Safety/judgement, Problem solving                     Memory: Decreased recall of precautions, Decreased short-term memory   Safety/Judgement: Decreased awareness of safety, Decreased awareness of deficits   Problem Solving: Slow processing       General Comments  VSS on RA. Pt requires extra time for processing and for pain management with movement.    Exercises     Shoulder Instructions      Home Living Family/patient expects to be  discharged to:: Private residence Living Arrangements: Spouse/significant other Available Help  at Discharge: Family Type of Home: Apartment Home Access: Level entry;Elevator (Lives on the 2nd floor)     Home Layout: One level     Bathroom Shower/Tub: Producer, television/film/video: Standard     Home Equipment: Rollator (4 wheels);Cane - single point;Shower seat;Grab bars - toilet;Grab bars - tub/shower          Prior Functioning/Environment Prior Level of Function : Independent/Modified Independent;Working/employed;Driving             Mobility Comments: Previously used a SPC during functional mobility. ADLs Comments: Previously Independent to Mod I with ADLs and IADLs and was driving.        OT Problem List: Decreased strength;Decreased activity tolerance;Impaired balance (sitting and/or standing);Decreased safety awareness;Decreased knowledge of use of DME or AE;Decreased knowledge of precautions;Pain      OT Treatment/Interventions: Self-care/ADL training;Therapeutic exercise;Energy conservation;DME and/or AE instruction;Therapeutic activities;Patient/family education;Balance training    OT Goals(Current goals can be found in the care plan section) Acute Rehab OT Goals Patient Stated Goal: To get pain under control OT Goal Formulation: With patient Time For Goal Achievement: 08/25/22 Potential to Achieve Goals: Good ADL Goals Pt Will Perform Grooming: with supervision;standing Pt Will Perform Lower Body Bathing: with min guard assist;sit to/from stand Pt Will Perform Lower Body Dressing: with min guard assist;sit to/from stand Pt Will Transfer to Toilet: with supervision;ambulating;regular height toilet (with least restrictive AD) Pt Will Perform Toileting - Clothing Manipulation and hygiene: with min guard assist;sit to/from stand  OT Frequency: Min 2X/week    Co-evaluation              AM-PAC OT "6 Clicks" Daily Activity     Outcome Measure Help from  another person eating meals?: None Help from another person taking care of personal grooming?: A Little Help from another person toileting, which includes using toliet, bedpan, or urinal?: A Lot Help from another person bathing (including washing, rinsing, drying)?: A Lot Help from another person to put on and taking off regular upper body clothing?: None (with extra time) Help from another person to put on and taking off regular lower body clothing?: A Lot 6 Click Score: 17   End of Session Equipment Utilized During Treatment: Rolling walker (2 wheels) Nurse Communication: Mobility status;Other (comment) (Pain level)  Activity Tolerance: Patient limited by pain;Other (comment) (Pt limited by anxiety) Patient left: in bed;with call bell/phone within reach;with nursing/sitter in room  OT Visit Diagnosis: Unsteadiness on feet (R26.81);Muscle weakness (generalized) (M62.81);Pain                Time: 0927-1009 OT Time Calculation (min): 42 min Charges:  OT General Charges $OT Visit: 1 Visit OT Evaluation $OT Eval Moderate Complexity: 1 Mod OT Treatments $Self Care/Home Management : 8-22 mins  Ashliegh Parekh "Orson Eva., OTR/L, MA Acute Rehab [phone]   Lendon Colonel 08/11/2022, 1:11 PM

## 2022-08-11 NOTE — Progress Notes (Signed)
   Medical records reviewed including progress notes, labs, and imaging. Patient assessed at the bedside. He is A&Ox3, reporting pain and dyspnea. He is having a hard time participating in GOC discussion today and requests that I re-visit at a later date. He declines my offer to reach out to any family or friends for support.   PMT will re-attempt to see patient at a later time/date. Detailed note and recommendations to follow once GOC has been completed.   Thank you for your referral and allowing PMT to assist in [name]'s care.   David [name], [facility] Palliative Medicine Team  Team Phone # [phone]   NO CHARGE

## 2022-08-11 NOTE — Evaluation (Signed)
Physical Therapy Evaluation Patient Details Name: David [name] MRN: [id number] DOB: [birth year] Today's Date: 08/11/2022  History of Present Illness  Pt is a 62 yo male admitted initially admitted to [facility] 4/9 then transfered to [facility]. Pt presented to [facility] ED with complaint of fever and found to have MRSA bacteremia and multiple abscesses, bilateral pneumonia and right-sided parapneumonic effusion, T7-T9 discitis and phlegmon, also had right foot osteomylitis. 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  Clinical Impression  Pt presents today with impaired functional mobility, limited by strength, balance, pain, cognition, and activity tolerance. Pt reports utilizing a SPC prior to this admission but currently requiring minA for mobility with use of RW with mild-moderate cueing for safety and technique. Pt lethargic intermittently during session, losing his track of thought and requiring cueing to finish his sentence. Cueing required throughout for R heel weight bearing and TLSO education/precautions for comfort. Acute PT will continue to follow up with pt to progress activity tolerance and mobility, recommend high intensity PT upon discharge to maximize independence with mobility prior to returning home.        Recommendations for follow up therapy are one component of a multi-disciplinary discharge planning process, led by the attending physician.  Recommendations may be updated based on patient status, additional functional criteria and insurance authorization.  Follow Up Recommendations       Assistance Recommended at Discharge Frequent or constant Supervision/Assistance  Patient can return home with the following  A little help with walking and/or transfers;Help with stairs or ramp for entrance;Assist for transportation;Direct  supervision/assist for medications management;Direct supervision/assist for financial management;Assistance with cooking/housework    Equipment Recommendations Other (comment) (defer to next level)  Recommendations for Other Services  Rehab consult    Functional Status Assessment Patient has had a recent decline in their functional status and demonstrates the ability to make significant improvements in function in a reasonable and predictable amount of time.     Precautions / Restrictions Precautions Precautions: Fall Required Braces or Orthoses: Spinal Brace Spinal Brace: Thoracolumbosacral orthotic;Applied in sitting position Other Brace: for use when ambulating Restrictions Weight Bearing Restrictions: Yes RLE Weight Bearing: Partial weight bearing RLE Partial Weight Bearing Percentage or Pounds: heel weight bearing Other Position/Activity Restrictions: with Darco shoe      Mobility  Bed Mobility Overal bed mobility: Needs Assistance Bed Mobility: Rolling, Sidelying to Sit, Sit to Sidelying Rolling: Min guard Sidelying to sit: Min assist     Sit to sidelying: Min assist General bed mobility comments: cueing for proper technique for log-roll technique with increased time and cueing to complete, minA for balance and trunk support into sitting and BLE management back to sidelying    Transfers Overall transfer level: Needs assistance Equipment used: Rolling walker (2 wheels) Transfers: Sit to/from Stand Sit to Stand: Min assist, From elevated surface           General transfer comment: minA to power up and steady from elevated bed, increased time to obtain upright position cueing for decreasing hip flexion    Ambulation/Gait Ambulation/Gait assistance: Min assist Gait Distance (Feet): 20 Feet (with a standing rest break to toilet) Assistive device: Rolling walker (2 wheels) Gait Pattern/deviations: Step-to pattern, Antalgic, Trunk flexed, Drifts right/left,  Decreased stride length Gait velocity: decreased     General Gait Details: cueing to maintain R Heel  weight bearing. Attempted to donn L shoe but pt reports his foot is swollen and shoe did not fit. Increased time for mobility with moderate imbalance, assistance for balance as well as RW management and cueing for safety  Stairs            Wheelchair Mobility    Modified Rankin (Stroke Patients Only)       Balance Overall balance assessment: Needs assistance Sitting-balance support: Bilateral upper extremity supported, Feet supported Sitting balance-Leahy Scale: Fair     Standing balance support: Bilateral upper extremity supported, During functional activity, Reliant on assistive device for balance Standing balance-Leahy Scale: Poor Standing balance comment: reliant on RW                             Pertinent Vitals/Pain Pain Assessment Pain Assessment: Faces Faces Pain Scale: Hurts even more Pain Location: back Pain Descriptors / Indicators: Grimacing, Guarding, Moaning Pain Intervention(s): Limited activity within patient's tolerance, Monitored during session, Repositioned    Home Living Family/patient expects to be discharged to:: Private residence Living Arrangements: Spouse/significant other Available Help at Discharge: Family Type of Home: Apartment Home Access: Elevator       Home Layout: One level Home Equipment: Rollator (4 wheels);Cane - single point;Shower seat;Grab bars - toilet;Grab bars - tub/shower      Prior Function Prior Level of Function : Independent/Modified Independent;Working/employed;Driving             Mobility Comments: utilized [facility] for mobility ADLs Comments: Previously Independent to [employer] I with ADLs and IADLs and was driving.     Hand Dominance   Dominant Hand: Right    Extremity/Trunk Assessment   Upper Extremity Assessment Upper Extremity Assessment: Defer to OT evaluation    Lower Extremity  Assessment Lower Extremity Assessment: Generalized weakness;RLE deficits/detail RLE Sensation: history of peripheral neuropathy    Cervical / Trunk Assessment Cervical / Trunk Assessment: Other exceptions Cervical / Trunk Exceptions: TLSO for ambulation  Communication   Communication: No difficulties  Cognition Arousal/Alertness: Lethargic, Awake/alert Behavior During Therapy: Flat affect Overall Cognitive Status: Impaired/Different from baseline Area of Impairment: Memory, Safety/judgement, Problem solving                     Memory: Decreased recall of precautions, Decreased short-term memory   Safety/Judgement: Decreased awareness of safety, Decreased awareness of deficits   Problem Solving: Slow processing, Requires verbal cues General Comments: A&Ox4 but very lethargic upon arrival and intermittently drowsy but once upright more alert, increased processing time with verbal cues for safety and precautions        General Comments General comments (skin integrity, edema, etc.): Pt with increased RR with pain, cueing for slower breaths, no family at bedside    Exercises     Assessment/Plan    PT Assessment Patient needs continued PT services  PT Problem List Decreased strength;Decreased activity tolerance;Decreased balance;Decreased mobility;Decreased cognition;Decreased knowledge of use of DME;Decreased safety awareness;Decreased knowledge of precautions       PT Treatment Interventions DME instruction;Gait training;Functional mobility training;Therapeutic activities;Therapeutic exercise;Balance training;Neuromuscular re-education;Cognitive remediation;Patient/family education    PT Goals (Current goals can be found in the Care Plan section)  Acute Rehab PT Goals Patient Stated Goal: get better before going home PT Goal Formulation: With patient Time For Goal Achievement: 08/25/22 Potential to Achieve Goals: Good    Frequency Min 3X/week     Co-evaluation  AM-PAC PT "6 Clicks" Mobility  Outcome Measure Help needed turning from your back to your side while in a flat bed without using bedrails?: A Little Help needed moving from lying on your back to sitting on the side of a flat bed without using bedrails?: A Little Help needed moving to and from a bed to a chair (including a wheelchair)?: A Little Help needed standing up from a chair using your arms (e.g., wheelchair or bedside chair)?: A Lot Help needed to walk in hospital room?: A Little Help needed climbing 3-5 steps with a railing? : Total 6 Click Score: 15    End of Session Equipment Utilized During Treatment: Gait belt;Back brace Activity Tolerance: Patient limited by pain Patient left: in bed;with call bell/phone within reach;with bed alarm set Nurse Communication: Mobility status PT Visit Diagnosis: Difficulty in walking, not elsewhere classified (R26.2);Other abnormalities of gait and mobility (R26.89)    Time: 1610-9604 PT Time Calculation (min) (ACUTE ONLY): 33 min   Charges:   PT Evaluation $PT Eval Moderate Complexity: 1 Mod          Lindalou Hose, PT DPT Acute Rehabilitation Services Office [phone]   Leonie Man 08/11/2022, 1:58 PM

## 2022-08-11 NOTE — Progress Notes (Signed)
Inpatient Rehab Admissions Coordinator Note:   Per therapy recommendations patient was screened for CIR candidacy by Stephania Fragmin, PT. At this time, pt appears to be a potential candidate for CIR. I will place an order for rehab consult for full assessment, per our protocol.  Please contact me any with questions.Estill Dooms, PT, DPT [phone] 08/11/22 4:19 PM

## 2022-08-11 NOTE — Progress Notes (Signed)
Pt c/o some chest tightness, SOB, and increased pain, VSS, pt SpO2 96% on RA, NRB mask placed for comfort and pt increased to 100%, HOB elevated, MD notified and 1 mg ativan ordered and given, x30 mins later analgesic given for pain, pt appears to be more comfortable at this time, VSS, call bell in reach, will monitor closely

## 2022-08-11 NOTE — Progress Notes (Signed)
Subjective: 9 Days Post-Op Procedure(s) (LRB): AMPUTATION RAY 5TH METATARSAL OF RIGHT FOOT (Right) IRRIGATION AND DEBRIDEMENT RIGHT 5TH METATARSAL FOOT BONE BIOPSY OF 5TH METATARSAL RIGHT FOOT (Right)  Patient reports pain as appropriately controlled. Denies any new numbness/tingling.   Objective:   VITALS:  Temp:  [98.2 F (36.8 C)-98.5 F (36.9 C)] 98.5 F (36.9 C) (05/09 0754) Pulse Rate:  [74-79] 79 (05/09 0754) Resp:  [18-20] 20 (05/09 0754) BP: (105-124)/(79-84) 114/79 (05/09 0754) SpO2:  [95 %-96 %] 96 % (05/09 0125) Weight:  [81.6 kg] 81.6 kg (05/09 0444)  Gen: AAOx3, NAD Comfortable at rest  Right Lower Extremity: Dressing changed - incision c/d/I ADF/APF/EHL 5/5 SILT throughout DP, PT 2+ to palp CR < 2s    LABS Recent Labs    08/09/22 0135 08/10/22 0121 08/11/22 0627  HGB 10.8* 9.7* 8.9*  WBC 12.3* 11.2* 12.0*  PLT 473* 401* 357   Recent Labs    08/10/22 0121 08/11/22 0627  NA 127* 126*  K 4.4 4.8  CL 93* 94*  CO2 23 23  BUN 25* 28*  CREATININE 1.56* 1.81*  GLUCOSE 246* 201*   No results for input(s): "LABPT", "INR" in the last 72 hours.   Assessment/Plan: 9 Days Post-Op Procedure(s) (LRB): AMPUTATION RAY 5TH METATARSAL OF RIGHT FOOT (Right) IRRIGATION AND DEBRIDEMENT RIGHT 5TH METATARSAL FOOT BONE BIOPSY OF 5TH METATARSAL RIGHT FOOT (Right)  -stable on RNF under Medicine primary team -strict heel weightbearing operative extremity, maximum elevation. Postop shoe when out of bed -daily dry dressing change by bedside nursing -DVT ppx: per primary team -Abx: per primary team (trend intra-op cx). Consider addition of gram negative coverage for potential polymicrobial infection given location of wound and high risk for further amputation (Ancef ordered) -follow up as outpatient within 7-10 days for wound check -sutures out in 2-3 more weeks in outpatient office  [name] 08/11/2022, 4:07 PM

## 2022-08-11 NOTE — Progress Notes (Addendum)
PROGRESS NOTE                                                                                                                                                                                                             Patient Demographics:    David [name], is a 62 y.o. male, DOB - [birth year], ZOX:[id number]  Outpatient Primary MD for the patient is David Register, MD    LOS - 30  Admit date - 07/12/2022    Chief Complaint  Patient presents with   Shortness of Breath       Brief Narrative (HPI from H&P)   PMH of chronic combined CHF, chronic A-fib on Eliquis, type II DM, prior amputation of right foot, HTN.  Present to the hospital with complaints of fever.  Found to have MRSA bacteremia as well as multiple abscesses.  Originally admitted at [facility] on 4/9.  Blood cultures came positive for MRSA, his further workup suggested that patient had bilateral pneumonia and right-sided parapneumonic effusion, T7-T9 discitis and phlegmon, also had right foot infection.  He was seen by pulmonary, IR, neurosurgery and ID along with orthopedic surgery.    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  He was transferred to my care on 08/11/2022 on day 30 of his hospital stay.   Significant procedures and events  4/10 ID was consulted. 4/12 TTE was negative, TEE was done which was negative for vegetation 4/18 nephrology consulted for AKI. 4/22 neurosurgery APP David [name]/ Dr David [name] were consulted. Recommend medical management with IV antibiotics only without any intervention. Neurosurgery was consulted and recommend conservative measures. 4/23 Dr. Lajoyce [name] was consulted for amputation.  Vascular surgery was consulted for concerns for PVD. 4/24 IR placed right-sided chest tube for pleural effusion 4/25 underwent  right below-knee popliteal artery, tibioperoneal trunk and peroneal angioplasty with Dr. Chestine [name]. 4/26 IR remove the chest tube.  Follow-up chest x-ray still shows pleural effusions. 4/27 patient currently does not want to perform the procedure with Dr. Lajoyce [name].  Informed Dr. Lajoyce [name] as well as consulted Dr. Linna [name]. 4/29: Patient agreeable to proceed with amputation surgery with orthopedic surgeon Dr. Odis [name] 4/30: Patient underwent right fifth ray revision amputation. 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 2. Thin epidural phlegmon/abscess along the posterior T7 and T8 endplates. 3. New mild pathologic compression deformity of the T8 vertebral body with 3-4 mm retropulsion of the posterior endplate which combined with the phlegmon/absces results in mild spinal canal stenosis 08/11/2022.  Transferred to my service on day 30 of his hospital stay.   Subjective:    [name] today has, No headache, No chest pain, No abdominal pain - No Nausea, No new weakness tingling or numbness, no shortness of breath, feels constipated.   Assessment  & Plan :    Sepsis secondary to MRSA bacteremia.  Potential sources T7-T9 discitis and phlegmon, right foot infection and osteomyelitis, right-sided pneumonia and parapneumonic effusion.  He had sepsis upon admission which has resolved,   T7 T9 discitis.  He was seen by neurosurgery Dr. Yetta [name] who recommended medical management for his discitis and phlegmon, ID is managing IV antibiotics.  Right foot infection and osteomyelitis.  Patient was seen by orthopedic surgery, underwent right fifth ray amputation on 08/02/2022 by Dr. Odis [name], continue antibiotics.  History of bilateral pneumonia with right-sided parapneumonic effusion.  Seen by pulmonary critical care.  Had chest tube placed by IR on 07/26/2024 and chest tube was removed on 07/28/2024.  Clinically  stable from the standpoint.   Acute on chronic pain.  Currently on combination of  IV Dilaudid as well as oxycodone and scheduled Robaxin, lyrica, have added oxycontin q12h.  Constipation with narcotic bowel.  Placed on Movantik and bowel regimen.  Anemia of chronic disease.  Stable.  Paroxysmal atrial fibrillation.  [place] vas 2 score of greater than 3.  Currently on digoxin, is beta-blocker along with Eliquis.  Hypertension.  Stable on beta-blocker and Aldactone combination.  Dyslipidemia.  On statin.    Acute on chronic combined CHF EF 60%.  Now resolved and compensated, on Coreg continue.  Currently not on diuretics.  AKI.  Baseline creatinine 1.2.  Gently hydrate on 08/11/2022 and monitor.  Hold further diuretics.    Peripheral vascular disease with critical limb ischemia.  Vascular surgery was consulted by Dr. Lajoyce [name] Underwent angiogram 4/25 with angioplasty of right below-knee popliteal artery, tibioperoneal trunk, and peroneal artery, Recommendation for aspirin, Plavix (>>Eliquis) and statin DW Dr. Chestine [name] 08/10/24.  DM type II.  On Lantus, Premeal NovoLog and sliding scale.  Monitor and adjust.  Lab Results  Component Value Date   HGBA1C 9.1 (H) 07/13/2022   CBG (last 3)  Recent Labs    08/10/22 1627 08/10/22 2041 08/11/22 0747  GLUCAP 69* 239* 190*          Condition - Extremely Guarded  Family Communication  :  None present  Code Status :  Full  Consults  :  ID,   PUD Prophylaxis :    Procedures  :     MRI - 1. 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 2. Thin epidural phlegmon/abscess along the posterior T7 and T8 endplates. 3. New mild pathologic compression deformity of the T8 vertebral body with 3-4 mm retropulsion of the posterior endplate which combined with the phlegmon/absces results in mild spinal canal stenosis. 4. Interval decrease  in size of the lobulated fluid collection in the paraspinal soft tissues at T7-T9. 5. Complex right pleural effusion, incompletely imaged but at least somewhat decreased in size compared to the prior MRI.  Disposition Plan  :    Status is: Inpatient   DVT Prophylaxis  :     apixaban (ELIQUIS) tablet 5 mg     Lab Results  Component Value Date   PLT 357 08/11/2022    Diet :  Diet Order             Diet Carb Modified Fluid consistency: Thin; Room service appropriate? Yes; Fluid restriction: 1500 mL Fluid  Diet effective now                    Inpatient Medications  Scheduled Meds:  apixaban  5 mg Oral BID   aspirin EC  81 mg Oral Daily   atorvastatin  80 mg Oral Daily   budesonide (PULMICORT) nebulizer solution  0.5 mg Nebulization BID   busPIRone  15 mg Oral TID   carvedilol  3.125 mg Oral BID   Chlorhexidine Gluconate Cloth  6 each Topical Daily   digoxin  125 mcg Oral Daily   docusate sodium  200 mg Oral BID   famotidine  20 mg Oral BID   feeding supplement (GLUCERNA SHAKE)  237 mL Oral TID BM   insulin aspart  0-20 Units Subcutaneous TID WC   insulin aspart  3 Units Subcutaneous TID WC   insulin glargine-yfgn  35 Units Subcutaneous Daily   lactulose  30 g Oral TID   lidocaine  1 patch Transdermal Daily   magnesium hydroxide  30 mL Oral BID   magnesium oxide  400 mg Oral Daily   methocarbamol  500 mg Oral TID   naloxegol oxalate  25 mg Oral Daily   oxyCODONE  10 mg Oral Q12H   polyethylene glycol  17 g Oral BID   pregabalin  50 mg Oral TID   senna-docusate  2 tablet Oral BID   Continuous Infusions:  sodium chloride Stopped (08/10/22 1943)   sodium chloride 75 mL/hr at 08/11/22 0436   vancomycin     PRN Meds:.sodium chloride, acetaminophen, Glycerin (Adult), HYDROmorphone (DILAUDID) injection, hydrOXYzine, ipratropium-albuterol, labetalol, melatonin, Muscle Rub, ondansetron (ZOFRAN) IV, ondansetron **OR** [DISCONTINUED] ondansetron (ZOFRAN) IV,  mouth rinse, oxyCODONE, sodium chloride flush    Objective:   Vitals:   08/10/22 2147 08/11/22 0125 08/11/22 0444 08/11/22 0754  BP: 124/79 112/84  114/79  Pulse: 74 79  79  Resp: 18 20  20   Temp: 98.5 F (36.9 C) 98.2 F (36.8 C)  98.5 F (36.9 C)  TempSrc: Oral Oral  Oral  SpO2: 95% 96%    Weight:   81.6 kg   Height:        Wt Readings from Last 3 Encounters:  08/11/22 81.6 kg  07/09/22 81.6 kg  07/01/22 83.5 kg     Intake/Output Summary (Last 24 hours) at 08/11/2022 1011 Last data filed at 08/11/2022 0400 Gross per 24 hour  Intake 2224.32 ml  Output --  Net 2224.32 ml     Physical Exam  Awake Alert, No new F.N deficits, Normal affect [place].AT,PERRAL Supple Neck, No JVD,   Symmetrical Chest wall movement, Good air movement bilaterally, CTAB RRR,No Gallops,Rubs or new Murmurs,  +ve B.Sounds, Abd Soft, No tenderness,   No Cyanosis, Clubbing or edema        Data Review:    Recent Labs  Lab 08/07/22 0111 08/08/22 0133 08/09/22 0135 08/10/22 0121 08/11/22 0627  WBC 12.0* 14.6* 12.3* 11.2* 12.0*  HGB 9.2* 11.0* 10.8* 9.7* 8.9*  HCT 27.3* 32.2* 32.9* 28.7* 26.3*  PLT 390  512* 473* 401* 357  MCV 87.8 87.7 88.7 86.7 86.2  MCH 29.6 30.0 29.1 29.3 29.2  MCHC 33.7 34.2 32.8 33.8 33.8  RDW 14.3 14.5 14.5 14.4 14.6  LYMPHSABS  --   --   --   --  1.1  MONOABS  --   --   --   --  1.2*  EOSABS  --   --   --   --  0.2  BASOSABS  --   --   --   --  0.1    Recent Labs  Lab 08/07/22 0111 08/08/22 0133 08/09/22 0135 08/10/22 0121 08/11/22 0627  NA 126* 126* 127* 127* 126*  K 4.2 4.4 4.0 4.4 4.8  CL 94* 91* 94* 93* 94*  CO2 25 25 24 23 23   ANIONGAP 7 10 9 11 9   GLUCOSE 321* 223* 244* 246* 201*  BUN 20 20 20  25* 28*  CREATININE 1.56* 1.62* 1.66* 1.56* 1.81*  AST 21 20 21 20  14*  ALT 16 15 13 13 11   ALKPHOS 115 126 114 94 91  BILITOT 0.6 0.9 0.9 0.9 0.8  ALBUMIN 2.0* 2.4* 2.3* 2.1* 2.0*  CRP  --   --   --   --  9.7*  MG  --   --   --   --  1.8  CALCIUM  8.9 9.5 9.6 9.4 9.1   Lab Results  Component Value Date   HGBA1C 9.1 (H) 07/13/2022       Radiology Reports DG Chest Port 1 View  Result Date: 08/11/2022 CLINICAL DATA:  Shortness of breath. EXAM: PORTABLE CHEST 1 VIEW COMPARISON:  07/31/2022 FINDINGS: Stable cardiac enlargement. Pulmonary vascular congestion. Unchanged appearance of right pleural effusion with right mid and right lower lung opacities. Left lung appears clear. IMPRESSION: 1. Persistent right pleural effusion and right mid and right lower lung opacities. 2. Cardiac enlargement and pulmonary vascular congestion. Electronically Signed   By: Signa Kell M.D.   On: 08/11/2022 06:50   MR THORACIC SPINE WO CONTRAST  Result Date: 08/08/2022 CLINICAL DATA:  Osteomyelitis. EXAM: MRI THORACIC SPINE WITHOUT CONTRAST TECHNIQUE: Multiplanar, multisequence MR imaging of the thoracic spine was performed. No intravenous contrast was administered. COMPARISON:  Thoracic spine MRI 07/25/2022. FINDINGS: Alignment: There is mild exaggeration of the normal thoracic lordosis. There is no significant antero or retrolisthesis. Vertebrae: Background marrow signal is normal There is confluent T1 hypointensity and STIR hyperintensity in the T7 through T9 vertebral bodies with relative sparing of the intervening T7-T8 disc space concerning for persistent discitis/osteomyelitis, progressed involving the T7 vertebral body compared to the prior study. There is thin T2 hyperintensity along the ventral aspect of the thecal sac at T7-T8 likely reflecting phlegmon and possible abscess. Additionally, there is mild compression deformity of the T8 vertebral body with 3-4 mm retropulsion of the posterior endplate which is new since the prior study consistent with interval pathologic fracture. The other thoracic vertebral body heights are preserved. A benign intraosseous hemangioma in the T12 vertebral body is unchanged. Compression deformity of the L4 vertebral body seen  on the sagittal count sequence is unchanged compared to the prior lumbar spine MRI. Cord:  Normal in signal and morphology. Paraspinal and other soft tissues: There is complex right pleural effusion, incompletely imaged but at least somewhat decreased in size compared to the prior MRI. There is smaller layering left pleural effusion. A lobulated fluid collection in the paraspinal soft tissues at T7-T9 is again seen, overall decreased in size  compared to the MRI. On the current study, the right aspect of the collection measures up to approximately 4.4 cm AP x 4.6 cm cc in the collection in total measures up to 5.2 cm TV. Disc levels: There is mild spinal canal stenosis at T8 due to the bony retropulsion and epidural phlegmon. There is overall mild background degenerative change without other significant spinal canal or neural foraminal stenosis. IMPRESSION: 1. 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 2. Thin epidural phlegmon/abscess along the posterior T7 and T8 endplates. 3. New mild pathologic compression deformity of the T8 vertebral body with 3-4 mm retropulsion of the posterior endplate which combined with the phlegmon/absces results in mild spinal canal stenosis. 4. Interval decrease in size of the lobulated fluid collection in the paraspinal soft tissues at T7-T9. 5. Complex right pleural effusion, incompletely imaged but at least somewhat decreased in size compared to the prior MRI. Electronically Signed   By: Lesia Hausen M.D.   On: 08/08/2022 15:54      Signature  -   Susa Raring M.D on 08/11/2022 at 10:11 AM   -  To page go to [email or web address]

## 2022-08-11 NOTE — Progress Notes (Deleted)
Office Visit    Patient Name: David [name] Date of Encounter: 08/11/2022  PCP:  [name], MD   [facility]  Cardiologist:  Charlton Haws, MD  Advanced Practice Provider:  No care team member to display Electrophysiologist:  None   HPI    David [name] is a 62 y.o. male with a hx of diabetes mellitus, persistent atrial fibrillation (failed DCCV 09/2017 and attempted sotalol with plan to repeat DCCV; DCCV not done and patient stopped meds), HFrEF (Myoview in 2019 with mild anterior lateral ischemia), suspected OSA, hypertension presents today for follow-up appointment.    He was admitted 10/2018 with AF with RVR and low EF.  The plan at that time was to attempt rate control and repeat echocardiogram 3 months later to check for improvement.  If his EF did not improve, he would need ischemic eval.  He was lost to follow-up at this time.  He was seen in the clinic in 3/22 and was supposed to return in 3 months.  He returned to the office 3/23 for follow-up visit with Tereso Newcomer, PA-C.  He was complaining of fatigue and lethargy for about a year.  He had some shortness of breath when he climbs stairs but no chest pain, syncope, orthopnea, leg edema.  He does not smoke.  He was seen by me 7/23,, he recently had his toe amputated by Dr. [name] due to diabetes.  He states that his toe was infected and it spread his blood.  He had MRSA in his blood and is now on IV antibiotics for 6 weeks through a PICC line.  He states that he has not been very active over the past few weeks because of his toe.  He is a Investment banker, operational for work and hopes that Dr. [name] will clear him on Friday to go back to work.  Due to all this going on, this was why he was never scheduled for his [employer].  I asked the patient to asked Dr. [name] when it would be appropriate to move forward with this test given his current condition.  Before his toe amputation, he admits to not taking his insulin like he should and  not eating like he should.  Recent A1c was over 11.  He has however lost 60 pounds over the last year and has tried to make healthier choices lately.  Once he recovers from his toe surgery he plans to be more active.  He had a new onset diagnosis of atrial fibrillation with a failed cardioversion.  He was supposed to see EP and the appointment was never scheduled.  We will plan for him to follow-up with EP today.  He may be an ablation candidate.  He was also recently started on a statin due to his cholesterol levels when he was in the hospital.  We have arranged for him to get follow-up labs in 8 weeks to check a lipid panel.  He does have some shortness of breath with exertion but this could also be due to deconditioning.  Echocardiogram from his hospitalization reviewed with the patient.  He was seen by me in 07/01/2022, he tells me that he had his toe amputation and then ended up with more infection on the bottom of his foot.  They told him he did not have enough blood flow to heal it up.  They went into his leg and opened some arteries and it was still not healing.  They told him that he  was in our practice with amputated.  He went to get a second opinion at the wound center at [facility].  They debrided that but did not cast every week.  It did not have stopped healing and ultimately he got an amputation of his pinky toe on that side.  Now he is finally able to walk and able to go back to work.  Heart: He wants to get this atrial fibrillation under control.  He is in A-fib with RVR rate 106 bpm.  He needs another cardioversion. We have asked him to not miss a dose of Eliquis.  He is a Investment banker, operational and gets a lot of exercise at work.  He has to do some heavy lifting and is always on the go.  No drinking or smoking.    He is scheduled for DCCV 08/24/2022.  Today, he ***   Past Medical History    Past Medical History:  Diagnosis Date   Cardiomyopathy (HCC)    a. EF 45% in 2019.   CHF (congestive heart  failure) (HCC)    Chronic anticoagulation 09/30/2017   Diabetes mellitus type 2 in nonobese [facility])    Does not have health insurance    Essential hypertension 09/26/2017   Financial difficulties    H/O noncompliance with medical treatment, presenting hazards to health    Non-insulin treated type 2 diabetes mellitus (HCC) 09/26/2017   Persistent atrial fibrillation (HCC)    Sleep apnea suspected 09/30/2017   Past Surgical History:  Procedure Laterality Date   ABDOMINAL AORTOGRAM W/LOWER EXTREMITY N/A 09/02/2021   Procedure: ABDOMINAL AORTOGRAM W/LOWER EXTREMITY;  Surgeon: Nada Libman, MD;  Location: [facility];  Service: Cardiovascular;  Laterality: N/A;   ABDOMINAL AORTOGRAM W/LOWER EXTREMITY N/A 12/28/2021   Procedure: ABDOMINAL AORTOGRAM W/LOWER EXTREMITY;  Surgeon: Nada Libman, MD;  Location: [facility];  Service: Cardiovascular;  Laterality: N/A;   ABDOMINAL AORTOGRAM W/LOWER EXTREMITY N/A 07/28/2022   Procedure: ABDOMINAL AORTOGRAM W/LOWER EXTREMITY;  Surgeon: Cephus Shelling, MD;  Location: [facility];  Service: Cardiovascular;  Laterality: N/A;   AMPUTATION Right 09/04/2021   Procedure: 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;  Surgeon: Nadara Mustard, MD;  Location: [facility] OR;  Service: Orthopedics;  Laterality: Right;   AMPUTATION Right 08/02/2022   Procedure: AMPUTATION RAY 5TH METATARSAL OF RIGHT FOOT;  Surgeon: [name], MD;  Location: [facility] OR;  Service: Orthopedics;  Laterality: Right;   APPENDECTOMY  1971   BONE BIOPSY Right 08/02/2022   Procedure: BONE BIOPSY OF 5TH METATARSAL RIGHT FOOT;  Surgeon: [name], MD;  Location: [facility] OR;  Service: Orthopedics;  Laterality: Right;   CARDIOVERSION N/A 09/28/2017   Procedure: CARDIOVERSION;  Surgeon: Thurmon Fair, MD;  Location: [facility]OS[facility];  Service: Cardiovascular;  Laterality: N/A;   IRRIGATION AND DEBRIDEMENT FOOT  08/02/2022   Procedure: IRRIGATION AND DEBRIDEMENT RIGHT 5TH METATARSAL FOOT;   Surgeon: [name], MD;  Location: [facility] OR;  Service: Orthopedics;;   PERIPHERAL VASCULAR BALLOON ANGIOPLASTY  09/02/2021   Procedure: PERIPHERAL VASCULAR BALLOON ANGIOPLASTY;  Surgeon: Nada Libman, MD;  Location: [facility];  Service: Cardiovascular;;   PERIPHERAL VASCULAR BALLOON ANGIOPLASTY  12/28/2021   Procedure: PERIPHERAL VASCULAR BALLOON ANGIOPLASTY;  Surgeon: Nada Libman, MD;  Location: [facility];  Service: Cardiovascular;;  Posterior Tibial PTA only   TEE WITHOUT CARDIOVERSION N/A 09/28/2017   Procedure: TRANSESOPHAGEAL ECHOCARDIOGRAM (TEE);  Surgeon: Thurmon Fair, MD;  Location: [facility] ENDOSCOPY;  Service: Cardiovascular;  Laterality: N/A;   TEE WITHOUT CARDIOVERSION N/A 09/03/2021   Procedure: TRANSESOPHAGEAL ECHOCARDIOGRAM (TEE);  Surgeon: Chilton Si, MD;  Location: [facility] ENDOSCOPY;  Service: Cardiovascular;  Laterality: N/A;   TEE WITHOUT CARDIOVERSION N/A 07/15/2022   Procedure: TRANSESOPHAGEAL ECHOCARDIOGRAM;  Surgeon: Thurmon Fair, MD;  Location: [facility];  Service: Cardiovascular;  Laterality: N/A;    Allergies  No Known Allergies   EKGs/Labs/Other Studies Reviewed:   The following studies were reviewed today:  09/03/2021 echocardiogram  IMPRESSIONS     1. Left ventricular ejection fraction, by estimation, is 60 to 65%. The  left ventricle has normal function. The left ventricle has no regional  wall motion abnormalities.   2. Right ventricular systolic function is normal. The right ventricular  size is normal.   3. No left atrial/left atrial appendage thrombus was detected.   4. The mitral valve is normal in structure. Trivial mitral valve  regurgitation. No evidence of mitral stenosis.   5. The aortic valve is tricuspid. Aortic valve regurgitation is not  visualized. No aortic stenosis is present.   6. The inferior vena cava is normal in size with greater than 50%  respiratory variability, suggesting right atrial  pressure of 3 mmHg.   Conclusion(s)/Recommendation(s): Normal biventricular function without  evidence of hemodynamically significant valvular heart disease.   Echocardiogram 10/17/2018 EF 35-40, moderate LVH, moderately reduced RVSF, mild to moderate MR, RVSP 36.3   Myoview 09/29/2017 Mild mid anterolateral reversibility-equivocal finding, EF 44; intermediate risk Reviewed by Dr. Eden Emms - no infarct; ?mild ant-lat ischemia >> focus on Rx of AFib at that time  EKG:  EKG is ordered today.  Atrial fibrillation with RVR, rate 106 bpm  Recent Labs: 08/30/2021: TSH 0.837 07/12/2022: B Natriuretic Peptide 319.6 08/11/2022: ALT 11; BUN 28; Creatinine, Ser 1.81; Hemoglobin 8.9; Magnesium 1.8; Platelets 357; Potassium 4.8; Sodium 126  Recent Lipid Panel    Component Value Date/Time   CHOL 213 (H) 12/14/2021 1025   TRIG 191 (H) 12/14/2021 1025   HDL 36 (L) 12/14/2021 1025   CHOLHDL 5.9 (H) 12/14/2021 1025   CHOLHDL 6.6 09/03/2021 0314   VLDL 50 (H) 09/03/2021 0314   LDLCALC 142 (H) 12/14/2021 1025    Home Medications   No outpatient medications have been marked as taking for the 08/18/22 encounter (Appointment) with Sharlene Dory, PA-C.     Review of Systems      All other systems reviewed and are otherwise negative except as noted above.  Physical Exam    VS:  There were no vitals taken for this visit. , BMI There is no height or weight on file to calculate BMI.  Wt Readings from Last 3 Encounters:  08/11/22 179 lb 14.3 oz (81.6 kg)  07/09/22 180 lb (81.6 kg)  07/01/22 184 lb (83.5 kg)     GEN: Well nourished, well developed, in no acute distress. HEENT: normal. Neck: Supple, no JVD, carotid bruits, or masses. Cardiac: Irregularly irregular, no murmurs, rubs, or gallops. No clubbing, cyanosis, edema.  Radials/PT 2+ and equal bilaterally.  Respiratory:  Respirations regular and unlabored, clear to auscultation bilaterally. GI: Soft, nontender, nondistended. MS: No deformity or  atrophy. Skin: Warm and dry, no rash. Neuro:  Strength and sensation are intact. Psych: Normal affect.  Assessment & Plan    HFrEF -Continue current medications including Eliquis 5 twice a day, aspirin 81 mg daily, Lipitor 80 mg daily, carvedilol increased to 6.25 mg twice a day, Plavix 75 g daily, Entresto 49 g twice  a day, spironolactone 25 mg daily -Consider SGLT2 inhibitors once diabetes is better controlled.  Recent A1c 11.2 -Echocardiogram from 09/03/2021 with LVEF 60 to 65%, no significant valvular abnormality, no regional wall motion abnormalities. -Does not smoke and does not drink  2. Persistent atrial fibrillation -DCCV previously and needs another one -Today, rate 106 bpm -He is asymptomatic -Will obtain follow-up with EP -increase coreg to 6.25 BID -Ablation vs. Another DCCV  3. Essential HTN -since 62 years old -Well-controlled today -Continue current medication regimen  4. Insulin-dependent type 2 DM -Working with PCP to get better control of his sugars -A1C 11.2 as of 08/30/21   5. Hypercholesterolemia -Last lipid panel with LDL 142, HDL 36, triglycerides 191 -Triglycerides are better but LDL remains above goal -Probably needs to go back on his Setia at his next appointment.    Disposition: Follow up 2 months with Charlton Haws, MD or APP.  Signed, Sharlene Dory, PA-C 08/11/2022, 4:46 PM [facility]

## 2022-08-12 ENCOUNTER — Inpatient Hospital Stay (HOSPITAL_COMMUNITY): Payer: Medicaid Other

## 2022-08-12 LAB — PHOSPHORUS: Phosphorus: 3.2 mg/dL (ref 2.5–4.6)

## 2022-08-12 LAB — COMPREHENSIVE METABOLIC PANEL
ALT: 12 U/L (ref 0–44)
AST: 15 U/L (ref 15–41)
Albumin: 2 g/dL — ABNORMAL LOW (ref 3.5–5.0)
Alkaline Phosphatase: 93 U/L (ref 38–126)
Anion gap: 8 (ref 5–15)
BUN: 24 mg/dL — ABNORMAL HIGH (ref 6–20)
CO2: 21 mmol/L — ABNORMAL LOW (ref 22–32)
Calcium: 9 mg/dL (ref 8.9–10.3)
Chloride: 96 mmol/L — ABNORMAL LOW (ref 98–111)
Creatinine, Ser: 1.65 mg/dL — ABNORMAL HIGH (ref 0.61–1.24)
GFR, Estimated: 47 mL/min — ABNORMAL LOW (ref >60)
Glucose, Bld: 245 mg/dL — ABNORMAL HIGH (ref 70–99)
Potassium: 4 mmol/L (ref 3.5–5.1)
Sodium: 125 mmol/L — ABNORMAL LOW (ref 135–145)
Total Bilirubin: 0.8 mg/dL (ref 0.3–1.2)
Total Protein: 7 g/dL (ref 6.5–8.1)

## 2022-08-12 LAB — CBC WITH DIFFERENTIAL/PLATELET
Abs Immature Granulocytes: 0.06 10*3/uL (ref 0.00–0.07)
Basophils Absolute: 0.1 10*3/uL (ref 0.0–0.1)
Basophils Relative: 1 %
Eosinophils Absolute: 0.1 10*3/uL (ref 0.0–0.5)
Eosinophils Relative: 1 %
HCT: 26.4 % — ABNORMAL LOW (ref 39.0–52.0)
Hemoglobin: 9.2 g/dL — ABNORMAL LOW (ref 13.0–17.0)
Immature Granulocytes: 1 %
Lymphocytes Relative: 11 %
Lymphs Abs: 1.2 10*3/uL (ref 0.7–4.0)
MCH: 30.3 pg (ref 26.0–34.0)
MCHC: 34.8 g/dL (ref 30.0–36.0)
MCV: 86.8 fL (ref 80.0–100.0)
Monocytes Absolute: 1.2 10*3/uL — ABNORMAL HIGH (ref 0.1–1.0)
Monocytes Relative: 11 %
Neutro Abs: 8.3 10*3/uL — ABNORMAL HIGH (ref 1.7–7.7)
Neutrophils Relative %: 75 %
Platelets: 352 10*3/uL (ref 150–400)
RBC: 3.04 MIL/uL — ABNORMAL LOW (ref 4.22–5.81)
RDW: 14.6 % (ref 11.5–15.5)
WBC: 11 10*3/uL — ABNORMAL HIGH (ref 4.0–10.5)
nRBC: 0 % (ref 0.0–0.2)

## 2022-08-12 LAB — URINALYSIS, ROUTINE W REFLEX MICROSCOPIC
Bilirubin Urine: NEGATIVE
Glucose, UA: NEGATIVE mg/dL
Hgb urine dipstick: NEGATIVE
Ketones, ur: NEGATIVE mg/dL
Leukocytes,Ua: NEGATIVE
Nitrite: NEGATIVE
Protein, ur: NEGATIVE mg/dL
Specific Gravity, Urine: 1.004 — ABNORMAL LOW (ref 1.005–1.030)
pH: 6 (ref 5.0–8.0)

## 2022-08-12 LAB — GLUCOSE, CAPILLARY
Glucose-Capillary: 231 mg/dL — ABNORMAL HIGH (ref 70–99)
Glucose-Capillary: 207 mg/dL — ABNORMAL HIGH (ref 70–99)
Glucose-Capillary: 129 mg/dL — ABNORMAL HIGH (ref 70–99)
Glucose-Capillary: 62 mg/dL — ABNORMAL LOW (ref 70–99)
Glucose-Capillary: 110 mg/dL — ABNORMAL HIGH (ref 70–99)

## 2022-08-12 LAB — MAGNESIUM: Magnesium: 1.9 mg/dL (ref 1.7–2.4)

## 2022-08-12 LAB — CK: Total CK: 16 U/L — ABNORMAL LOW (ref 49–397)

## 2022-08-12 LAB — BRAIN NATRIURETIC PEPTIDE: B Natriuretic Peptide: 452.7 pg/mL — ABNORMAL HIGH (ref 0.0–100.0)

## 2022-08-12 LAB — OSMOLALITY: Osmolality: 285 mOsm/kg (ref 275–295)

## 2022-08-12 LAB — OSMOLALITY, URINE: Osmolality, Ur: 171 mOsm/kg — ABNORMAL LOW (ref 300–900)

## 2022-08-12 LAB — CREATININE, URINE, RANDOM: Creatinine, Urine: 35 mg/dL

## 2022-08-12 LAB — PROCALCITONIN: Procalcitonin: 0.17 ng/mL

## 2022-08-12 LAB — C-REACTIVE PROTEIN: CRP: 10.8 mg/dL — ABNORMAL HIGH (ref <1.0)

## 2022-08-12 LAB — SODIUM, URINE, RANDOM: Sodium, Ur: 20 mmol/L

## 2022-08-12 MED ORDER — OXYCODONE HCL 5 MG PO TABS
10.0000 mg | ORAL_TABLET | ORAL | Status: DC | PRN
  Administered 2022-08-12 – 2022-08-13 (×3): 10 mg via ORAL
  Filled 2022-08-12 (×3): qty 2

## 2022-08-12 MED ORDER — FOLIC ACID 1 MG PO TABS
1.0000 mg | ORAL_TABLET | Freq: Every day | ORAL | Status: DC
  Administered 2022-08-12 – 2022-08-19 (×8): 1 mg via ORAL
  Filled 2022-08-12 (×8): qty 1

## 2022-08-12 MED ORDER — SORBITOL 70 % SOLN
400.0000 mL | TOPICAL_OIL | Freq: Once | ORAL | Status: DC
  Filled 2022-08-12: qty 120

## 2022-08-12 MED ORDER — LACTATED RINGERS IV SOLN: INTRAVENOUS | Status: DC

## 2022-08-12 MED ORDER — FERROUS SULFATE 325 (65 FE) MG PO TABS
325.0000 mg | ORAL_TABLET | Freq: Two times a day (BID) | ORAL | Status: DC
  Administered 2022-08-12 – 2022-08-19 (×15): 325 mg via ORAL
  Filled 2022-08-12 (×15): qty 1

## 2022-08-12 MED ORDER — HYDROMORPHONE HCL 1 MG/ML IJ SOLN: 0.5000 mg | INTRAMUSCULAR | Status: DC | PRN

## 2022-08-12 MED ORDER — HYDROMORPHONE HCL 1 MG/ML IJ SOLN
0.5000 mg | Freq: Four times a day (QID) | INTRAMUSCULAR | Status: DC | PRN
  Administered 2022-08-12 – 2022-08-13 (×3): 0.5 mg via INTRAVENOUS
  Filled 2022-08-12 (×3): qty 0.5

## 2022-08-12 MED ORDER — OXYCODONE HCL 5 MG PO TABS: 5.0000 mg | ORAL_TABLET | ORAL | Status: DC | PRN

## 2022-08-12 NOTE — Progress Notes (Signed)
Inpatient Rehab Admissions Coordinator:    I met with pt. To discuss potential CIR admit. He is interested, thinks his sister and wife can provide 24/7 support at home. I will reach out to sister to confirm that plan and follow for potential admit once medically ready.   Megan Salon, MS, CCC-SLP Rehab Admissions Coordinator  [phone] (celll) [phone] (office)

## 2022-08-12 NOTE — Progress Notes (Signed)
Pt w/ some increased confusion, rambling and paranoid speech, and irritability, pt not violent or impulsive at this time, bed alarm activated and audible, MD notified, will monitor closely and frequently

## 2022-08-12 NOTE — Progress Notes (Addendum)
PROGRESS NOTE                                                                                                                                                                                                             Patient Demographics:    Jonathn Santoli, is a 62 y.o. male, DOB - 03/02/1961, WUJ:[id number]  Outpatient Primary MD for the patient is [name], MD    LOS - 31  Admit date - 07/12/2022    Chief Complaint  Patient presents with   Shortness of Breath       Brief Narrative (HPI from H&P)   PMH of chronic combined CHF, chronic A-fib on Eliquis, type II DM, prior amputation of right foot, HTN.  Present to the hospital with complaints of fever.  Found to have MRSA bacteremia as well as multiple abscesses.  Originally admitted at [facility] on 4/9.  Blood cultures came positive for MRSA, his further workup suggested that patient had bilateral pneumonia and right-sided parapneumonic effusion, T7-T9 discitis and phlegmon, also had right foot infection.  He was seen by pulmonary, IR, neurosurgery and ID along with orthopedic surgery.    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  He was transferred to my care on 08/11/2022 on day 30 of his hospital stay.   Significant procedures and events  4/10 ID was consulted. 4/12 TTE was negative, TEE was done which was negative for vegetation 4/18 nephrology consulted for AKI. 4/22 neurosurgery [facility]/ Dr Yetta Barre were consulted. Recommend medical management with IV antibiotics only without any intervention. Neurosurgery was consulted and recommend conservative measures. 4/23 Dr. [name] was consulted for amputation.  Vascular surgery was consulted for concerns for PVD. 4/24 IR placed right-sided chest tube for pleural effusion 4/25 underwent  right below-knee popliteal artery, tibioperoneal trunk and peroneal angioplasty with Dr. Chestine Spore. 4/26 IR remove the chest tube.  Follow-up chest x-ray still shows pleural effusions. 4/27 patient currently does not want to perform the procedure with Dr. [name].  Informed Dr. [name] as well as consulted Dr. Linna Caprice. 4/29: Patient agreeable to proceed with amputation surgery with orthopedic surgeon Dr. Odis Hollingshead 4/30: Patient underwent right fifth ray revision amputation. 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 2. Thin epidural phlegmon/abscess along the posterior T7 and T8 endplates. 3. New mild pathologic compression deformity of the T8 vertebral body with 3-4 mm retropulsion of the posterior endplate which combined with the phlegmon/absces results in mild spinal canal stenosis 08/11/2022.  Transferred to my service on day 30 of his hospital stay.   Subjective:    Moses Meier today has, No headache, No chest pain, No abdominal pain - No Nausea, No new weakness tingling or numbness, no shortness of breath, feels constipated.   Assessment  & Plan :    Sepsis secondary to MRSA bacteremia.  Potential sources T7-T9 discitis and phlegmon, right foot infection and osteomyelitis, right-sided pneumonia and parapneumonic effusion.  He had sepsis upon admission which has resolved - Case discussed with ID Dr. Algis Liming on 08/12/2022.  T7 T9 discitis.  He was seen by neurosurgery Dr. Yetta Barre who recommended medical management for his discitis and phlegmon, ID is managing IV antibiotics currently on combination of daptomycin and doxycycline.  Right foot infection and osteomyelitis.  Patient was seen by orthopedic surgery, underwent right fifth ray amputation on 08/02/2022 by Dr. Odis Hollingshead, continue antibiotics along with supportive care.  History of bilateral pneumonia with right-sided parapneumonic  effusion.  Seen by pulmonary critical care.  Had chest tube placed by IR on 07/26/2024 and chest tube was removed on 07/28/2024.  Clinically stable from the standpoint.   Acute on chronic pain.  Currently on combination of  IV Dilaudid as well as oxycodone and scheduled Robaxin, lyrica, and OxyContin every 12, he is on multiple high doses of narcotics and doses adjusted further on 08/12/2022 to avoid overdose.  Constipation with narcotic bowel.  Placed on Movantik and bowel regimen.  He had 2 bowel movements on 08/11/2022, passing flatus, no abdominal pain but requesting an enema 08/12/2022, will order SMO G and M on 08/12/2022.  Anemia of chronic disease.  Stable.  Paroxysmal atrial fibrillation.  [place] vas 2 score of greater than 3.  Currently on digoxin, is beta-blocker along with Eliquis.  Hypertension.  Stable on beta-blocker and Aldactone combination.  Dyslipidemia.  On statin.    Acute on chronic combined CHF EF 60%.  Now resolved and compensated, on Coreg continue.  Currently not on diuretics.  Hyponatremia with AKI.  Baseline creatinine 1.2, was on diuretics which were discontinued on 08/11/2022 continue hydration, urine sodium less than 20.  Could be SIADH as well due to chronic pain but will hydrate and monitor.  Peripheral vascular disease with critical limb ischemia.  Vascular surgery was consulted by Dr. [name] Underwent angiogram 4/25 with angioplasty of right below-knee popliteal artery, tibioperoneal trunk, and peroneal artery, Recommendation for aspirin, Plavix (>>Eliquis) and statin DW Dr. Chestine Spore 08/10/24.  DM type II.  On Lantus, Premeal NovoLog and sliding scale.  Monitor and adjust.  Lab Results  Component Value Date   HGBA1C 9.1 (H) 07/13/2022   CBG (last 3)  Recent Labs    08/11/22 1534 08/11/22 2059 08/12/22 0803  GLUCAP 93 121* 231*          Condition - Extremely Guarded  Family Communication  : Called the listed phone number for patient's friend [phone]  on  08/12/2022, phone number is disconnected.  Code Status :  Full  Consults  :  ID, orthopedics, neurosurgery  PUD Prophylaxis :    Procedures  :     MRI - 1. 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 2. Thin epidural phlegmon/abscess along the posterior T7 and T8 endplates. 3. New mild pathologic compression deformity of the T8 vertebral body with 3-4 mm retropulsion of the posterior endplate which combined with the phlegmon/absces results in mild spinal canal stenosis. 4. Interval decrease in size of the lobulated fluid collection in the paraspinal soft tissues at T7-T9. 5. Complex right pleural effusion, incompletely imaged but at least somewhat decreased in size compared to the prior MRI.       Disposition Plan  :    Status is: Inpatient   DVT Prophylaxis  :     apixaban (ELIQUIS) tablet 5 mg     Lab Results  Component Value Date   PLT 352 08/12/2022    Diet :  Diet Order             Diet Carb Modified Fluid consistency: Thin; Room service appropriate? Yes; Fluid restriction: 1500 mL Fluid  Diet effective now                    Inpatient Medications  Scheduled Meds:  apixaban  5 mg Oral BID   aspirin EC  81 mg Oral Daily   atorvastatin  80 mg Oral Daily   budesonide (PULMICORT) nebulizer solution  0.5 mg Nebulization BID   busPIRone  15 mg Oral TID   carvedilol  3.125 mg Oral BID   Chlorhexidine Gluconate Cloth  6 each Topical Daily   digoxin  125 mcg Oral Daily   docusate sodium  200 mg Oral BID   doxycycline  100 mg Oral Q12H   famotidine  20 mg Oral BID   feeding supplement (GLUCERNA SHAKE)  237 mL Oral TID BM   ferrous sulfate  325 mg Oral BID WC   folic acid  1 mg Oral Daily   insulin aspart  0-20 Units Subcutaneous TID WC   insulin aspart  3 Units Subcutaneous TID WC   insulin glargine-yfgn  35 Units Subcutaneous Daily    lactulose  30 g Oral TID   lidocaine  1 patch Transdermal Daily   magnesium oxide  400 mg Oral Daily   methocarbamol  500 mg Oral TID   naloxegol oxalate  25 mg Oral Daily   oxyCODONE  10 mg Oral Q12H   polyethylene glycol  17 g Oral BID   pregabalin  50 mg Oral TID   senna-docusate  2 tablet Oral BID   sorbitol, milk of mag, mineral oil, glycerin (SMOG) enema  400 mL Rectal Once   Continuous Infusions:  DAPTOmycin (CUBICIN) 800 mg in sodium chloride 0.9 % IVPB Stopped (08/11/22 2136)   PRN Meds:.acetaminophen, Glycerin (Adult), HYDROmorphone (DILAUDID) injection, hydrOXYzine, ipratropium-albuterol, labetalol, melatonin, Muscle Rub, ondansetron **OR** [DISCONTINUED] ondansetron (ZOFRAN) IV, mouth rinse, oxyCODONE, sodium chloride flush    Objective:   Vitals:   08/12/22 0441 08/12/22 0800 08/12/22 0802 08/12/22 0851  BP: 126/74 (!) 146/100    Pulse: 69   71  Resp: 18   18  Temp: 98.9 F (37.2 C) 98.9 F (37.2 C)    TempSrc: Oral  Oral   SpO2:      Weight: 83.1 kg     Height:        Wt Readings from Last 3 Encounters:  08/12/22 83.1 kg  07/09/22 81.6 kg  07/01/22 83.5 kg     Intake/Output Summary (Last 24 hours) at 08/12/2022 1102 Last data  filed at 08/12/2022 0300 Gross per 24 hour  Intake 1639.63 ml  Output --  Net 1639.63 ml     Physical Exam  Awake Alert, No new F.N deficits, Normal affect .AT,PERRAL Supple Neck, No JVD,   Symmetrical Chest wall movement, Good air movement bilaterally, CTAB RRR,No Gallops,Rubs or new Murmurs,  +ve B.Sounds, Abd Soft, No tenderness,   No Cyanosis, Clubbing or edema        Data Review:    Recent Labs  Lab 08/08/22 0133 08/09/22 0135 08/10/22 0121 08/11/22 0627 08/12/22 0530  WBC 14.6* 12.3* 11.2* 12.0* 11.0*  HGB 11.0* 10.8* 9.7* 8.9* 9.2*  HCT 32.2* 32.9* 28.7* 26.3* 26.4*  PLT 512* 473* 401* 357 352  MCV 87.7 88.7 86.7 86.2 86.8  MCH 30.0 29.1 29.3 29.2 30.3  MCHC 34.2 32.8 33.8 33.8 34.8  RDW 14.5 14.5  14.4 14.6 14.6  LYMPHSABS  --   --   --  1.1 1.2  MONOABS  --   --   --  1.2* 1.2*  EOSABS  --   --   --  0.2 0.1  BASOSABS  --   --   --  0.1 0.1    Recent Labs  Lab 08/08/22 0133 08/09/22 0135 08/10/22 0121 08/11/22 0627 08/12/22 0530  NA 126* 127* 127* 126* 125*  K 4.4 4.0 4.4 4.8 4.0  CL 91* 94* 93* 94* 96*  CO2 25 24 23 23  21*  ANIONGAP 10 9 11 9 8   GLUCOSE 223* 244* 246* 201* 245*  BUN 20 20 25* 28* 24*  CREATININE 1.62* 1.66* 1.56* 1.81* 1.65*  AST 20 21 20  14* 15  ALT 15 13 13 11 12   ALKPHOS 126 114 94 91 93  BILITOT 0.9 0.9 0.9 0.8 0.8  ALBUMIN 2.4* 2.3* 2.1* 2.0* 2.0*  CRP  --   --   --  9.7* 10.8*  PROCALCITON  --   --   --  0.12 0.17  BNP  --   --   --   --  452.7*  MG  --   --   --  1.8 1.9  CALCIUM 9.5 9.6 9.4 9.1 9.0   Lab Results  Component Value Date   HGBA1C 9.1 (H) 07/13/2022     Radiology Reports DG Chest Port 1 View  Result Date: 08/11/2022 CLINICAL DATA:  Shortness of breath. EXAM: PORTABLE CHEST 1 VIEW COMPARISON:  07/31/2022 FINDINGS: Stable cardiac enlargement. Pulmonary vascular congestion. Unchanged appearance of right pleural effusion with right mid and right lower lung opacities. Left lung appears clear. IMPRESSION: 1. Persistent right pleural effusion and right mid and right lower lung opacities. 2. Cardiac enlargement and pulmonary vascular congestion. Electronically Signed   By: Signa Kell M.D.   On: 08/11/2022 06:50   MR THORACIC SPINE WO CONTRAST  Result Date: 08/08/2022 CLINICAL DATA:  Osteomyelitis. EXAM: MRI THORACIC SPINE WITHOUT CONTRAST TECHNIQUE: Multiplanar, multisequence MR imaging of the thoracic spine was performed. No intravenous contrast was administered. COMPARISON:  Thoracic spine MRI 07/25/2022. FINDINGS: Alignment: There is mild exaggeration of the normal thoracic lordosis. There is no significant antero or retrolisthesis. Vertebrae: Background marrow signal is normal There is confluent T1 hypointensity and STIR  hyperintensity in the T7 through T9 vertebral bodies with relative sparing of the intervening T7-T8 disc space concerning for persistent discitis/osteomyelitis, progressed involving the T7 vertebral body compared to the prior study. There is thin T2 hyperintensity along the ventral aspect of the thecal sac at T7-T8 likely reflecting  phlegmon and possible abscess. Additionally, there is mild compression deformity of the T8 vertebral body with 3-4 mm retropulsion of the posterior endplate which is new since the prior study consistent with interval pathologic fracture. The other thoracic vertebral body heights are preserved. A benign intraosseous hemangioma in the T12 vertebral body is unchanged. Compression deformity of the L4 vertebral body seen on the sagittal count sequence is unchanged compared to the prior lumbar spine MRI. Cord:  Normal in signal and morphology. Paraspinal and other soft tissues: There is complex right pleural effusion, incompletely imaged but at least somewhat decreased in size compared to the prior MRI. There is smaller layering left pleural effusion. A lobulated fluid collection in the paraspinal soft tissues at T7-T9 is again seen, overall decreased in size compared to the MRI. On the current study, the right aspect of the collection measures up to approximately 4.4 cm AP x 4.6 cm cc in the collection in total measures up to 5.2 cm TV. Disc levels: There is mild spinal canal stenosis at T8 due to the bony retropulsion and epidural phlegmon. There is overall mild background degenerative change without other significant spinal canal or neural foraminal stenosis. IMPRESSION: 1. 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 2. Thin epidural phlegmon/abscess along the posterior T7 and T8 endplates. 3. New mild pathologic compression deformity of the T8 vertebral body with 3-4 mm  retropulsion of the posterior endplate which combined with the phlegmon/absces results in mild spinal canal stenosis. 4. Interval decrease in size of the lobulated fluid collection in the paraspinal soft tissues at T7-T9. 5. Complex right pleural effusion, incompletely imaged but at least somewhat decreased in size compared to the prior MRI. Electronically Signed   By: Lesia Hausen M.D.   On: 08/08/2022 15:54      Signature  -   Susa Raring M.D on 08/12/2022 at 11:02 AM   -  To page go to [email or web address]

## 2022-08-12 NOTE — TOC Progression Note (Signed)
Transition of Care [facility]) - Progression Note    Patient Details  Name: David [name] MRN: [id number] Date of Birth: September 26, 1960  [facility] CM/SW Contact  Mearl Latin, LCSW Phone Number: 08/12/2022, 5:25 PM  Clinical Narrative:    Patient transferred to 5W. CIR is assessing.    Expected Discharge Plan: Home w Home Health Services Barriers to Discharge: Continued Medical Work up, Inadequate or no insurance (Needs IV abx)  Expected Discharge Plan and Services In-house Referral: Clinical Social Work Discharge Planning Services: NA Post Acute Care Choice: Home Health Living arrangements for the past 2 months: Hotel/Motel                 DME Arranged: IV pump/equipment DME Agency:  Julianne Rice) Date DME Agency Contacted: 07/16/22 Time DME Agency Contacted: 1430 Representative spoke with at DME Agency: Jeri Modena             Social Determinants of Health (SDOH) Interventions SDOH Screenings   Food Insecurity: No Food Insecurity (07/12/2022)  Housing: Low Risk  (07/12/2022)  Transportation Needs: No Transportation Needs (07/12/2022)  Utilities: Not At Risk (07/12/2022)  Depression (PHQ2-9): Low Risk  (11/12/2021)  Stress: Stress Concern Present (09/26/2017)  Tobacco Use: Medium Risk (08/03/2022)    Readmission Risk Interventions    09/06/2021   12:07 PM  Readmission Risk Prevention Plan  Post Dischage Appt Complete  Medication Screening Complete  Transportation Screening Complete

## 2022-08-13 ENCOUNTER — Inpatient Hospital Stay (HOSPITAL_COMMUNITY): Payer: Medicaid Other

## 2022-08-13 DIAGNOSIS — B9562 Methicillin resistant Staphylococcus aureus infection as the cause of diseases classified elsewhere: Secondary | ICD-10-CM

## 2022-08-13 DIAGNOSIS — Z7189 Other specified counseling: Secondary | ICD-10-CM

## 2022-08-13 DIAGNOSIS — Z66 Do not resuscitate: Secondary | ICD-10-CM

## 2022-08-13 DIAGNOSIS — R7881 Bacteremia: Secondary | ICD-10-CM

## 2022-08-13 DIAGNOSIS — F4323 Adjustment disorder with mixed anxiety and depressed mood: Secondary | ICD-10-CM | POA: Diagnosis present

## 2022-08-13 LAB — CBC WITH DIFFERENTIAL/PLATELET
Abs Immature Granulocytes: 0.08 10*3/uL — ABNORMAL HIGH (ref 0.00–0.07)
Basophils Absolute: 0.1 10*3/uL (ref 0.0–0.1)
Basophils Relative: 1 %
Eosinophils Absolute: 0.2 10*3/uL (ref 0.0–0.5)
Eosinophils Relative: 2 %
HCT: 30 % — ABNORMAL LOW (ref 39.0–52.0)
Hemoglobin: 10 g/dL — ABNORMAL LOW (ref 13.0–17.0)
Immature Granulocytes: 1 %
Lymphocytes Relative: 8 %
Lymphs Abs: 0.8 10*3/uL (ref 0.7–4.0)
MCH: 29.3 pg (ref 26.0–34.0)
MCHC: 33.3 g/dL (ref 30.0–36.0)
MCV: 88 fL (ref 80.0–100.0)
Monocytes Absolute: 1 10*3/uL (ref 0.1–1.0)
Monocytes Relative: 10 %
Neutro Abs: 8.2 10*3/uL — ABNORMAL HIGH (ref 1.7–7.7)
Neutrophils Relative %: 78 %
Platelets: 379 10*3/uL (ref 150–400)
RBC: 3.41 MIL/uL — ABNORMAL LOW (ref 4.22–5.81)
RDW: 14.8 % (ref 11.5–15.5)
WBC: 10.4 10*3/uL (ref 4.0–10.5)
nRBC: 0 % (ref 0.0–0.2)

## 2022-08-13 LAB — PHOSPHORUS: Phosphorus: 3 mg/dL (ref 2.5–4.6)

## 2022-08-13 LAB — GLUCOSE, CAPILLARY
Glucose-Capillary: 96 mg/dL (ref 70–99)
Glucose-Capillary: 88 mg/dL (ref 70–99)
Glucose-Capillary: 91 mg/dL (ref 70–99)
Glucose-Capillary: 89 mg/dL (ref 70–99)

## 2022-08-13 LAB — C-REACTIVE PROTEIN: CRP: 9.3 mg/dL — ABNORMAL HIGH (ref <1.0)

## 2022-08-13 LAB — COMPREHENSIVE METABOLIC PANEL
ALT: 12 U/L (ref 0–44)
AST: 18 U/L (ref 15–41)
Albumin: 2.2 g/dL — ABNORMAL LOW (ref 3.5–5.0)
Alkaline Phosphatase: 102 U/L (ref 38–126)
Anion gap: 10 (ref 5–15)
BUN: 14 mg/dL (ref 6–20)
CO2: 24 mmol/L (ref 22–32)
Calcium: 9.8 mg/dL (ref 8.9–10.3)
Chloride: 99 mmol/L (ref 98–111)
Creatinine, Ser: 1.31 mg/dL — ABNORMAL HIGH (ref 0.61–1.24)
GFR, Estimated: >60 mL/min (ref >60)
Glucose, Bld: 113 mg/dL — ABNORMAL HIGH (ref 70–99)
Potassium: 3.9 mmol/L (ref 3.5–5.1)
Sodium: 133 mmol/L — ABNORMAL LOW (ref 135–145)
Total Bilirubin: 0.6 mg/dL (ref 0.3–1.2)
Total Protein: 7.6 g/dL (ref 6.5–8.1)

## 2022-08-13 LAB — UREA NITROGEN, URINE: Urea Nitrogen, Ur: 270 mg/dL

## 2022-08-13 LAB — MAGNESIUM: Magnesium: 1.9 mg/dL (ref 1.7–2.4)

## 2022-08-13 LAB — PROCALCITONIN: Procalcitonin: 0.14 ng/mL

## 2022-08-13 LAB — BRAIN NATRIURETIC PEPTIDE: B Natriuretic Peptide: 477.1 pg/mL — ABNORMAL HIGH (ref 0.0–100.0)

## 2022-08-13 MED ORDER — ACETAMINOPHEN 325 MG PO TABS
650.0000 mg | ORAL_TABLET | Freq: Four times a day (QID) | ORAL | Status: DC
  Administered 2022-08-13 – 2022-08-19 (×24): 650 mg via ORAL
  Filled 2022-08-13 (×25): qty 2

## 2022-08-13 MED ORDER — NALOXONE HCL 0.4 MG/ML IJ SOLN: 0.4000 mg | INTRAMUSCULAR | Status: DC | PRN

## 2022-08-13 MED ORDER — SORBITOL 70 % SOLN
400.0000 mL | TOPICAL_OIL | Freq: Once | ORAL | Status: AC
  Administered 2022-08-13: 400 mL via RECTAL
  Filled 2022-08-13: qty 120

## 2022-08-13 MED ORDER — OXYCODONE HCL 5 MG PO TABS
5.0000 mg | ORAL_TABLET | ORAL | Status: DC | PRN
  Administered 2022-08-16 – 2022-08-18 (×5): 5 mg via ORAL
  Filled 2022-08-13 (×6): qty 1

## 2022-08-13 MED ORDER — OXYCODONE HCL 5 MG PO TABS
5.0000 mg | ORAL_TABLET | Freq: Four times a day (QID) | ORAL | Status: DC | PRN
  Administered 2022-08-13: 5 mg via ORAL
  Filled 2022-08-13: qty 1

## 2022-08-13 MED ORDER — AMLODIPINE BESYLATE 10 MG PO TABS
10.0000 mg | ORAL_TABLET | Freq: Every day | ORAL | Status: DC
  Administered 2022-08-13 – 2022-08-14 (×2): 10 mg via ORAL
  Filled 2022-08-13 (×3): qty 1

## 2022-08-13 MED ORDER — MAGNESIUM HYDROXIDE 400 MG/5ML PO SUSP
30.0000 mL | Freq: Two times a day (BID) | ORAL | Status: AC
  Filled 2022-08-13 (×2): qty 30

## 2022-08-13 MED ORDER — HYDROMORPHONE HCL 1 MG/ML IJ SOLN
1.0000 mg | Freq: Four times a day (QID) | INTRAMUSCULAR | Status: DC | PRN
  Administered 2022-08-13 – 2022-08-18 (×17): 1 mg via INTRAVENOUS
  Filled 2022-08-13 (×17): qty 1

## 2022-08-13 MED ORDER — IBUPROFEN 400 MG PO TABS
400.0000 mg | ORAL_TABLET | Freq: Three times a day (TID) | ORAL | Status: DC
  Administered 2022-08-13: 400 mg via ORAL
  Filled 2022-08-13: qty 1

## 2022-08-13 MED ORDER — OXYCODONE HCL ER 20 MG PO T12A
20.0000 mg | EXTENDED_RELEASE_TABLET | Freq: Two times a day (BID) | ORAL | Status: DC
  Administered 2022-08-13 – 2022-08-14 (×2): 20 mg via ORAL
  Filled 2022-08-13 (×2): qty 1

## 2022-08-13 MED ORDER — OXYCODONE HCL 5 MG PO TABS
10.0000 mg | ORAL_TABLET | ORAL | Status: DC | PRN
  Administered 2022-08-14 – 2022-08-19 (×19): 10 mg via ORAL
  Filled 2022-08-13 (×20): qty 2

## 2022-08-13 MED ORDER — CARVEDILOL 6.25 MG PO TABS
6.2500 mg | ORAL_TABLET | Freq: Two times a day (BID) | ORAL | Status: DC
  Administered 2022-08-14 – 2022-08-19 (×9): 6.25 mg via ORAL
  Filled 2022-08-13 (×12): qty 1

## 2022-08-13 NOTE — Consult Note (Addendum)
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 male patient admitted with fever.  HPI:  62 year old male with PMH of chronic combined CHF, chronic A-fib on Eliquis, type II DM, prior amputation of right foot, HTN who initially presented to the hospital with  fever but later found to have MRSA bacteremia as well as multiple abscesses. Patient denies prior history of mental illness, however, he states that he has been getting increasingly anxious, apprehensive, and depressed for the past 6 weeks due to multiple medical problems. He states that he is worried because he is not getting any better even with current treatment. Patient reports that his anxiety and depression will disappear once he finds solutions to his medical problem and his foot pain. He reports compliance to his  medications and all the treatment offered to him. He is alert, oriented x 4 with no cognitive impairment. He denies psychosis, delusions and self harming thoughts. Also, he states that he does not want any other mental health medications other than what he is already taking for anxiety(Buspar). However, he want his pain and medical conditions to be giving more priority. Patient demonstrate a clear understanding of his current medical condition and appreciate how the information provided by the providers applies to his current situation. He has weighed the risks and benefits and communicating his choice for a comprehensive treatment in order to get stable. As a result, patient has capacity to make an informed medical decision for himself.   Past Psychiatric History: none reported  Risk to Self:  denies Risk to Others:  denies Prior Inpatient Therapy:  N/A Prior Outpatient Therapy:  N/A  Past Medical History:  Past Medical History:  Diagnosis Date   Cardiomyopathy (HCC)    a. EF 45% in 2019.   CHF (congestive heart failure) (HCC)    Chronic anticoagulation 09/30/2017   Diabetes mellitus type 2 in nonobese [facility])    Does not have health insurance    Essential hypertension 09/26/2017   Financial difficulties    H/O noncompliance with medical treatment, presenting hazards to health    Non-insulin treated type 2 diabetes mellitus (HCC) 09/26/2017   Persistent atrial fibrillation (HCC)    Sleep apnea suspected 09/30/2017    Past Surgical History:  Procedure Laterality Date   ABDOMINAL AORTOGRAM W/LOWER EXTREMITY N/A 09/02/2021   Procedure: ABDOMINAL AORTOGRAM W/LOWER EXTREMITY;  Surgeon: Nada Libman, MD;  [facility];  Service: Cardiovascular;  Laterality: N/A;   ABDOMINAL AORTOGRAM W/LOWER EXTREMITY N/A 12/28/2021   Procedure: ABDOMINAL AORTOGRAM W/LOWER EXTREMITY;  Surgeon: Nada Libman, MD;  [facility]: [facility];  Service: Cardiovascular;  Laterality: N/A;    ABDOMINAL AORTOGRAM W/LOWER EXTREMITY N/A 07/28/2022   Procedure: ABDOMINAL AORTOGRAM W/LOWER EXTREMITY;  Surgeon: Cephus Shelling, MD;  [facility];  Service: Cardiovascular;  Laterality: N/A;   AMPUTATION Right 09/04/2021   Procedure: 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;  Surgeon: Nadara Mustard, MD;  Location: [facility] OR;  Service: Orthopedics;  Laterality: Right;   AMPUTATION Right 08/02/2022   Procedure: AMPUTATION RAY 5TH METATARSAL OF RIGHT FOOT;  Surgeon: [name], MD;  Location: [facility] OR;  Service: Orthopedics;  Laterality: Right;   APPENDECTOMY  1971   BONE BIOPSY Right 08/02/2022   Procedure: BONE BIOPSY OF 5TH METATARSAL RIGHT FOOT;  Surgeon: [name], MD;  Location: [facility] OR;  Service: Orthopedics;  Laterality: Right;   CARDIOVERSION N/A 09/28/2017   Procedure: CARDIOVERSION;  Surgeon: Thurmon Fair, MD;  [facility]OS[facility];  Service: Cardiovascular;  Laterality: N/A;   IRRIGATION AND DEBRIDEMENT FOOT  08/02/2022   Procedure: IRRIGATION AND DEBRIDEMENT RIGHT 5TH METATARSAL FOOT;  Surgeon: [name], MD;  [facility]: [facility];  Service: Orthopedics;;   PERIPHERAL VASCULAR BALLOON ANGIOPLASTY  09/02/2021   Procedure: PERIPHERAL VASCULAR BALLOON ANGIOPLASTY;  Surgeon: Nada Libman, MD;  [facility];  Service: Cardiovascular;;   PERIPHERAL VASCULAR BALLOON ANGIOPLASTY  12/28/2021   Procedure: PERIPHERAL VASCULAR BALLOON ANGIOPLASTY;  Surgeon: Nada Libman, MD;  [facility];  Service: Cardiovascular;;  Posterior Tibial PTA only   TEE WITHOUT CARDIOVERSION N/A 09/28/2017   Procedure: TRANSESOPHAGEAL ECHOCARDIOGRAM (TEE);  Surgeon: Thurmon Fair, MD;  Location: [facility] ENDOSCOPY;  Service: Cardiovascular;  Laterality: N/A;   TEE WITHOUT CARDIOVERSION N/A 09/03/2021   Procedure: TRANSESOPHAGEAL ECHOCARDIOGRAM (TEE);  Surgeon: Chilton Si, MD;  Location: [facility] ENDOSCOPY;  Service: Cardiovascular;  Laterality: N/A;   TEE WITHOUT  CARDIOVERSION N/A 07/15/2022   Procedure: TRANSESOPHAGEAL ECHOCARDIOGRAM;  Surgeon: Thurmon Fair, MD;  [facility];  Service: Cardiovascular;  Laterality: N/A;   Family History:  Family History  Problem Relation Age of Onset   Hypertension Mother    Family Psychiatric  History:   Social History:  Social History   Substance and Sexual Activity  Alcohol Use Not Currently   Alcohol/week: 3.0 standard drinks of alcohol   Types: 3 Cans of beer per week   Comment: States he quit drinking 8-9 months ago     Social History   Substance and Sexual Activity  Drug Use Never    Social History   Socioeconomic History   Marital status: Married    Spouse name: Not on file   Number of children: Not on file   Years of education: Not on file   Highest education level: Not on file  Occupational History   Not on file  Tobacco Use   Smoking status: Former    Years: 15    Types: Cigarettes    Quit date: 03/18/2018    Years since quitting: 4.4   Smokeless tobacco: Never   Tobacco comments:    09/26/2017 "2-3 cigarettes/month now"  Vaping Use   Vaping Use: Never used  Substance and Sexual Activity   Alcohol use: Not Currently    Alcohol/week: 3.0 standard drinks of alcohol    Types: 3 Cans of  beer per week    Comment: States he quit drinking 8-9 months ago   Drug use: Never   Sexual activity: Not Currently  Other Topics Concern   Not on file  Social History Narrative   Not on file   Social Determinants of Health   Financial Resource Strain: Not on file  Food Insecurity: No Food Insecurity (07/12/2022)   Hunger Vital Sign    Worried About Running Out of Food in the Last Year: Never true    Ran Out of Food in the Last Year: Never true  Transportation Needs: No Transportation Needs (07/12/2022)   PRAPARE - Administrator, Civil Service (Medical): No    Lack of Transportation (Non-Medical): No  Physical Activity: Not on file  Stress: Stress Concern  Present (09/26/2017)   [employer] of Occupational Health - Occupational Stress Questionnaire    Feeling of Stress : Very much  Social Connections: Not on file   Additional Social History:    Allergies:  No Known Allergies  Labs:  Results for orders placed or performed during the hospital encounter of 07/12/22 (from the past 48 hour(s))  Glucose, capillary     Status: None   Collection Time: 08/11/22  3:34 PM  Result Value Ref Range   Glucose-Capillary 93 70 - 99 mg/dL    Comment: Glucose reference range applies only to samples taken after fasting for at least 8 hours.  Glucose, capillary     Status: Abnormal   Collection Time: 08/11/22  8:59 PM  Result Value Ref Range   Glucose-Capillary 121 (H) 70 - 99 mg/dL    Comment: Glucose reference range applies only to samples taken after fasting for at least 8 hours.  Comprehensive metabolic panel     Status: Abnormal   Collection Time: 08/12/22  5:30 AM  Result Value Ref Range   Sodium 125 (L) 135 - 145 mmol/L   Potassium 4.0 3.5 - 5.1 mmol/L   Chloride 96 (L) 98 - 111 mmol/L   CO2 21 (L) 22 - 32 mmol/L   Glucose, Bld 245 (H) 70 - 99 mg/dL    Comment: Glucose reference range applies only to samples taken after fasting for at least 8 hours.   BUN 24 (H) 6 - 20 mg/dL   Creatinine, Ser 1.61 (H) 0.61 - 1.24 mg/dL   Calcium 9.0 8.9 - 09.6 mg/dL   Total Protein 7.0 6.5 - 8.1 g/dL   Albumin 2.0 (L) 3.5 - 5.0 g/dL   AST 15 15 - 41 U/L   ALT 12 0 - 44 U/L   Alkaline Phosphatase 93 38 - 126 U/L   Total Bilirubin 0.8 0.3 - 1.2 mg/dL   GFR, Estimated 47 (L) >60 mL/min    Comment: (NOTE) Calculated using the CKD-EPI Creatinine Equation (2021)    Anion gap 8 5 - 15    Comment: Performed at [facility] Lab, [address], [place], [place] [address]  Magnesium     Status: None   Collection Time: 08/12/22  5:30 AM  Result Value Ref Range   Magnesium 1.9 1.7 - 2.4 mg/dL    Comment: Performed at [facility] Lab, [address] [place], [place] [address]  Phosphorus     Status: None   Collection Time: 08/12/22  5:30 AM  Result Value Ref Range   Phosphorus 3.2 2.5 - 4.6 mg/dL    Comment: Performed at [facility] Lab, [address], [place], [place]  [address]  CBC with Differential/Platelet     Status: Abnormal   Collection Time: 08/12/22  5:30 AM  Result Value Ref Range   WBC 11.0 (H) 4.0 - 10.5 K/uL   RBC 3.04 (L) 4.22 - 5.81 MIL/uL   Hemoglobin 9.2 (L) 13.0 - 17.0 g/dL   HCT 60.4 (L) 54.0 - 98.1 %   MCV 86.8 80.0 - 100.0 fL   MCH 30.3 26.0 - 34.0 pg   MCHC 34.8 30.0 - 36.0 g/dL   RDW 19.1 47.8 - 29.5 %   Platelets 352 150 - 400 K/uL   nRBC 0.0 0.0 - 0.2 %   Neutrophils Relative % 75 %   Neutro Abs 8.3 (H) 1.7 - 7.7 K/uL   Lymphocytes Relative 11 %   Lymphs Abs 1.2 0.7 - 4.0 K/uL   Monocytes Relative 11 %   Monocytes Absolute 1.2 (H) 0.1 - 1.0 K/uL   Eosinophils Relative 1 %   Eosinophils Absolute 0.1 0.0 - 0.5 K/uL   Basophils Relative 1 %   Basophils Absolute 0.1 0.0 - 0.1 K/uL   Immature Granulocytes 1 %   Abs Immature Granulocytes 0.06 0.00 - 0.07 K/uL    Comment: Performed at [facility] Lab, [address], [place], [place] [address]  Brain natriuretic peptide     Status: Abnormal   Collection Time: 08/12/22  5:30 AM  Result Value Ref Range   B Natriuretic Peptide 452.7 (H) 0.0 - 100.0 pg/mL    Comment: Performed at [facility] Lab, [address], [place], [place] [address]  C-reactive protein     Status: Abnormal   Collection Time: 08/12/22  5:30 AM  Result Value Ref Range   CRP 10.8 (H) <1.0 mg/dL    Comment: Performed at [facility] Lab, [address] [place], [place] [address]  Procalcitonin     Status: None   Collection Time: 08/12/22  5:30 AM  Result Value Ref Range   Procalcitonin 0.17 ng/mL    Comment:        Interpretation: PCT (Procalcitonin) <= 0.5 ng/mL: Systemic infection (sepsis) is not likely. Local bacterial infection is possible. (NOTE)       Sepsis  PCT Algorithm           Lower Respiratory Tract                                      Infection PCT Algorithm    ----------------------------     ----------------------------         PCT < 0.25 ng/mL                PCT < 0.10 ng/mL          Strongly encourage             Strongly discourage   discontinuation of antibiotics    initiation of antibiotics    ----------------------------     -----------------------------       PCT 0.25 - 0.50 ng/mL            PCT 0.10 - 0.25 ng/mL               OR       >80% decrease in PCT            Discourage initiation of  antibiotics      Encourage discontinuation           of antibiotics    ----------------------------     -----------------------------         PCT >= 0.50 ng/mL              PCT 0.26 - 0.50 ng/mL               AND        <80% decrease in PCT             Encourage initiation of                                             antibiotics       Encourage continuation           of antibiotics    ----------------------------     -----------------------------        PCT >= 0.50 ng/mL                  PCT > 0.50 ng/mL               AND         increase in PCT                  Strongly encourage                                      initiation of antibiotics    Strongly encourage escalation           of antibiotics                                     -----------------------------                                           PCT <= 0.25 ng/mL                                                 OR                                        > 80% decrease in PCT                                      Discontinue / Do not initiate                                             antibiotics  Performed at [facility] Lab, [address], [place], [place] [address]   CK     Status: Abnormal   Collection Time: 08/12/22  5:30 AM  Result Value Ref Range   Total CK 16 (L) 49 - 397 U/L    Comment: Performed at [facility] Lab, [address] [place], [place] [address]  Osmolality     Status: None   Collection Time: 08/12/22  5:30 AM  Result Value Ref Range   Osmolality 285 275 - 295 mOsm/kg    Comment: Performed at [facility] Lab, [address], [place], [place] [address]  Urinalysis, Routine w reflex microscopic -Urine, Clean Catch     Status: Abnormal   Collection Time: 08/12/22  5:53 AM  Result Value Ref Range   Color, Urine STRAW (A) YELLOW   APPearance CLEAR CLEAR   Specific Gravity, Urine 1.004 (L) 1.005 - 1.030   pH 6.0 5.0 - 8.0   Glucose, UA NEGATIVE NEGATIVE mg/dL   Hgb urine dipstick NEGATIVE NEGATIVE   Bilirubin Urine NEGATIVE NEGATIVE   Ketones, ur NEGATIVE NEGATIVE mg/dL   Protein, ur NEGATIVE NEGATIVE mg/dL   Nitrite NEGATIVE NEGATIVE   Leukocytes,Ua NEGATIVE NEGATIVE    Comment: Performed at [facility], [address] [place], [place] [address]  Osmolality, urine     Status: Abnormal   Collection Time: 08/12/22  5:53 AM  Result Value Ref Range   Osmolality, Ur 171 (L) 300 - 900 mOsm/kg    Comment: Performed at [facility] Lab, [address] [place], [place] [address]  Creatinine, urine, random     Status: None   Collection Time: 08/12/22  5:53 AM  Result Value Ref Range   Creatinine, Urine 35 mg/dL    Comment: Performed at [facility] Lab, [address] [place], [place] [address]  Sodium, urine, random     Status: None   Collection Time: 08/12/22  5:53 AM  Result Value Ref Range   Sodium, Ur 20 mmol/L    Comment: Performed at [facility] Lab, [address], [place], [place] [address]  Urea nitrogen, urine     Status: None   Collection Time: 08/12/22  5:53 AM  Result Value Ref Range   Urea Nitrogen, Ur 270 Not Estab. mg/dL    Comment: (NOTE) Performed At: [facility] [address] [place], [place] [id number] Jolene Schimke MD [id number]   Glucose, capillary     Status: Abnormal   Collection Time: 08/12/22  8:03 AM  Result Value  Ref Range   Glucose-Capillary 231 (H) 70 - 99 mg/dL    Comment: Glucose reference range applies only to samples taken after fasting for at least 8 hours.  Glucose, capillary     Status: Abnormal   Collection Time: 08/12/22 12:29 PM  Result Value Ref Range   Glucose-Capillary 207 (H) 70 - 99 mg/dL    Comment: Glucose reference range applies only to samples taken after fasting for at least 8 hours.  Glucose, capillary     Status: Abnormal   Collection Time: 08/12/22  3:53 PM  Result Value Ref Range   Glucose-Capillary 129 (H) 70 - 99 mg/dL    Comment: Glucose reference range applies only to samples taken after fasting for at least 8 hours.  Glucose, capillary     Status: Abnormal   Collection Time: 08/12/22  9:58 PM  Result Value Ref Range   Glucose-Capillary 62 (L) 70 - 99 mg/dL    Comment: Glucose reference range applies only to samples taken after fasting for at least 8 hours.  Glucose, capillary  Status: Abnormal   Collection Time: 08/12/22 10:56 PM  Result Value Ref Range   Glucose-Capillary 110 (H) 70 - 99 mg/dL    Comment: Glucose reference range applies only to samples taken after fasting for at least 8 hours.  Comprehensive metabolic panel     Status: Abnormal   Collection Time: 08/13/22  2:53 AM  Result Value Ref Range   Sodium 133 (L) 135 - 145 mmol/L    Comment: DELTA CHECK NOTED   Potassium 3.9 3.5 - 5.1 mmol/L   Chloride 99 98 - 111 mmol/L   CO2 24 22 - 32 mmol/L   Glucose, Bld 113 (H) 70 - 99 mg/dL    Comment: Glucose reference range applies only to samples taken after fasting for at least 8 hours.   BUN 14 6 - 20 mg/dL   Creatinine, Ser 9.52 (H) 0.61 - 1.24 mg/dL   Calcium 9.8 8.9 - 84.1 mg/dL   Total Protein 7.6 6.5 - 8.1 g/dL   Albumin 2.2 (L) 3.5 - 5.0 g/dL   AST 18 15 - 41 U/L   ALT 12 0 - 44 U/L   Alkaline Phosphatase 102 38 - 126 U/L   Total Bilirubin 0.6 0.3 - 1.2 mg/dL   GFR, Estimated >32 >44 mL/min    Comment: (NOTE) Calculated using the CKD-EPI  Creatinine Equation (2021)    Anion gap 10 5 - 15    Comment: Performed at [facility] Lab, [address] [place], [place] [address]  Magnesium     Status: None   Collection Time: 08/13/22  2:53 AM  Result Value Ref Range   Magnesium 1.9 1.7 - 2.4 mg/dL    Comment: Performed at [facility] Lab, [address], [place], [place] [address]  Phosphorus     Status: None   Collection Time: 08/13/22  2:53 AM  Result Value Ref Range   Phosphorus 3.0 2.5 - 4.6 mg/dL    Comment: Performed at [facility] Lab, [address], [place], [place] [address]  CBC with Differential/Platelet     Status: Abnormal   Collection Time: 08/13/22  2:53 AM  Result Value Ref Range   WBC 10.4 4.0 - 10.5 K/uL   RBC 3.41 (L) 4.22 - 5.81 MIL/uL   Hemoglobin 10.0 (L) 13.0 - 17.0 g/dL   HCT 74.2 (L) 59.5 - 63.8 %   MCV 88.0 80.0 - 100.0 fL   MCH 29.3 26.0 - 34.0 pg   MCHC 33.3 30.0 - 36.0 g/dL   RDW 75.6 43.3 - 29.5 %   Platelets 379 150 - 400 K/uL   nRBC 0.0 0.0 - 0.2 %   Neutrophils Relative % 78 %   Neutro Abs 8.2 (H) 1.7 - 7.7 K/uL   Lymphocytes Relative 8 %   Lymphs Abs 0.8 0.7 - 4.0 K/uL   Monocytes Relative 10 %   Monocytes Absolute 1.0 0.1 - 1.0 K/uL   Eosinophils Relative 2 %   Eosinophils Absolute 0.2 0.0 - 0.5 K/uL   Basophils Relative 1 %   Basophils Absolute 0.1 0.0 - 0.1 K/uL   Immature Granulocytes 1 %   Abs Immature Granulocytes 0.08 (H) 0.00 - 0.07 K/uL    Comment: Performed at [facility], [address] [place], [place] [address]  Brain natriuretic peptide     Status: Abnormal   Collection Time: 08/13/22  2:53 AM  Result Value Ref Range   B Natriuretic Peptide 477.1 (H) 0.0 - 100.0  pg/mL    Comment: Performed at [facility] Lab, [address] [place], [place] [address]  C-reactive protein     Status: Abnormal   Collection Time: 08/13/22  2:53 AM  Result Value Ref Range   CRP 9.3 (H) <1.0 mg/dL    Comment: Performed at [facility], [address],  [place], [place] [address]  Procalcitonin     Status: None   Collection Time: 08/13/22  2:53 AM  Result Value Ref Range   Procalcitonin 0.14 ng/mL    Comment:        Interpretation: PCT (Procalcitonin) <= 0.5 ng/mL: Systemic infection (sepsis) is not likely. Local bacterial infection is possible. (NOTE)       Sepsis PCT Algorithm           Lower Respiratory Tract                                      Infection PCT Algorithm    ----------------------------     ----------------------------         PCT < 0.25 ng/mL                PCT < 0.10 ng/mL          Strongly encourage             Strongly discourage   discontinuation of antibiotics    initiation of antibiotics    ----------------------------     -----------------------------       PCT 0.25 - 0.50 ng/mL            PCT 0.10 - 0.25 ng/mL               OR       >80% decrease in PCT            Discourage initiation of                                            antibiotics      Encourage discontinuation           of antibiotics    ----------------------------     -----------------------------         PCT >= 0.50 ng/mL              PCT 0.26 - 0.50 ng/mL               AND        <80% decrease in PCT             Encourage initiation of                                             antibiotics       Encourage continuation           of antibiotics    ----------------------------     -----------------------------        PCT >= 0.50 ng/mL                  PCT > 0.50 ng/mL               AND  increase in PCT                  Strongly encourage                                      initiation of antibiotics    Strongly encourage escalation           of antibiotics                                     -----------------------------                                           PCT <= 0.25 ng/mL                                                 OR                                        > 80% decrease in PCT                                       Discontinue / Do not initiate                                             antibiotics  Performed at [facility] Lab, [address] [place], [place] [address]   Glucose, capillary     Status: None   Collection Time: 08/13/22  8:25 AM  Result Value Ref Range   Glucose-Capillary 96 70 - 99 mg/dL    Comment: Glucose reference range applies only to samples taken after fasting for at least 8 hours.   Comment 1 Notify RN   Glucose, capillary     Status: None   Collection Time: 08/13/22 12:23 PM  Result Value Ref Range   Glucose-Capillary 88 70 - 99 mg/dL    Comment: Glucose reference range applies only to samples taken after fasting for at least 8 hours.   Comment 1 Notify RN     Current Facility-Administered Medications  Medication Dose Route Frequency Provider Last Rate Last Admin   acetaminophen (TYLENOL) tablet 650 mg  650 mg Oral Q6H Ernie Avena, NP   650 mg at 08/13/22 1238   amLODipine (NORVASC) tablet 10 mg  10 mg Oral Daily Leroy Sea, MD   10 mg at 08/13/22 0657   apixaban (ELIQUIS) tablet 5 mg  5 mg Oral BID Leander Rams, RPH   5 mg at 08/13/22 1016   aspirin EC tablet 81 mg  81 mg Oral Daily Cephus Shelling, MD   81 mg at 08/13/22 1016   atorvastatin (LIPITOR) tablet 80 mg  80 mg Oral Daily Cephus Shelling, MD   80 mg at 08/13/22  1016   budesonide (PULMICORT) nebulizer solution 0.5 mg  0.5 mg Nebulization BID Cephus Shelling, MD   0.5 mg at 08/12/22 2005   busPIRone (BUSPAR) tablet 15 mg  15 mg Oral TID Rolly Salter, MD   15 mg at 08/13/22 1016   carvedilol (COREG) tablet 6.25 mg  6.25 mg Oral BID Leroy Sea, MD       Chlorhexidine Gluconate Cloth 2 % PADS 6 each  6 each Topical Daily Cephus Shelling, MD   6 each at 08/13/22 1015   DAPTOmycin (CUBICIN) 800 mg in sodium chloride 0.9 % IVPB  10 mg/kg Intravenous Q2000 Daiva Eves, Lisette Grinder, MD   Stopped at 08/12/22 2038   digoxin (LANOXIN) tablet 125 mcg  125 mcg Oral Daily Cephus Shelling, MD   125 mcg at 08/13/22 1017   docusate sodium (COLACE) capsule 200 mg  200 mg Oral BID Leroy Sea, MD   200 mg at 08/12/22 2259   doxycycline (VIBRA-TABS) tablet 100 mg  100 mg Oral Q12H Daiva Eves, Lisette Grinder, MD   100 mg at 08/13/22 1016   famotidine (PEPCID) tablet 20 mg  20 mg Oral BID Cephus Shelling, MD   20 mg at 08/13/22 1016   feeding supplement (GLUCERNA SHAKE) (GLUCERNA SHAKE) liquid 237 mL  237 mL Oral TID BM Rolly Salter, MD   237 mL at 08/13/22 1241   ferrous sulfate tablet 325 mg  325 mg Oral BID WC Leroy Sea, MD   325 mg at 08/13/22 1017   folic acid (FOLVITE) tablet 1 mg  1 mg Oral Daily Leroy Sea, MD   1 mg at 08/13/22 1017   Glycerin (Adult) 2 g suppository 1 suppository  1 suppository Rectal Daily PRN Cephus Shelling, MD   1 suppository at 08/13/22 0657   HYDROmorphone (DILAUDID) injection 1 mg  1 mg Intravenous Q6H PRN Leroy Sea, MD       hydrOXYzine (ATARAX) tablet 25 mg  25 mg Oral TID PRN Rolly Salter, MD   25 mg at 08/11/22 2104   insulin aspart (novoLOG) injection 0-20 Units  0-20 Units Subcutaneous TID WC Cephus Shelling, MD   3 Units at 08/12/22 1648   insulin aspart (novoLOG) injection 3 Units  3 Units Subcutaneous TID WC Jerald Kief, MD   3 Units at 08/13/22 1238   insulin glargine-yfgn (SEMGLEE) injection 35 Units  35 Units Subcutaneous Daily Jerald Kief, MD   35 Units at 08/13/22 1018   ipratropium-albuterol (DUONEB) 0.5-2.5 (3) MG/3ML nebulizer solution 3 mL  3 mL Nebulization Q6H PRN Cephus Shelling, MD       labetalol (NORMODYNE) injection 10 mg  10 mg Intravenous Q10 min PRN Cephus Shelling, MD       lactulose (CHRONULAC) 10 GM/15ML solution 30 g  30 g Oral TID Leroy Sea, MD   30 g at 08/13/22 1017   lidocaine (LIDODERM) 5 % 1 patch  1 patch Transdermal Daily Cephus Shelling, MD   1 patch at 08/12/22 0981   magnesium hydroxide (MILK OF MAGNESIA) suspension 30 mL  30  mL Oral BID Leroy Sea, MD       melatonin tablet 3 mg  3 mg Oral QHS PRN Opyd, Lavone Neri, MD   3 mg at 08/12/22 0036   methocarbamol (ROBAXIN) tablet 500 mg  500 mg Oral TID Rolly Salter, MD   500  mg at 08/13/22 1016   Muscle Rub CREA   Topical PRN Cephus Shelling, MD   1 Application at 07/22/22 2209   naloxegol oxalate (MOVANTIK) tablet 25 mg  25 mg Oral Daily Leroy Sea, MD   25 mg at 08/13/22 1021   naloxone (NARCAN) injection 0.4 mg  0.4 mg Intravenous PRN Ernie Avena, NP       ondansetron [facility]) tablet 4 mg  4 mg Oral Q6H PRN Cephus Shelling, MD   4 mg at 07/12/22 1749   Oral care mouth rinse  15 mL Mouth Rinse PRN Cephus Shelling, MD       oxyCODONE (Oxy IR/ROXICODONE) immediate release tablet 10 mg  10 mg Oral Q4H PRN Ernie Avena, NP       oxyCODONE (Oxy IR/ROXICODONE) immediate release tablet 5 mg  5 mg Oral Q4H PRN Ernie Avena, NP       oxyCODONE (OXYCONTIN) 12 hr tablet 20 mg  20 mg Oral Q12H Ferolito, Albertina Sen[place], NP       polyethylene glycol (MIRALAX / GLYCOLAX) packet 17 g  17 g Oral BID Jerald Kief, MD   17 g at 08/12/22 2300   pregabalin (LYRICA) capsule 50 mg  50 mg Oral TID Rolly Salter, MD   50 mg at 08/13/22 1017   senna-docusate (Senokot-S) tablet 2 tablet  2 tablet Oral BID Cephus Shelling, MD   2 tablet at 08/12/22 2300   sodium chloride flush (NS) 0.9 % injection 10-40 mL  10-40 mL Intracatheter PRN Cephus Shelling, MD        Musculoskeletal: Strength & Muscle Tone: within normal limits Gait & Station:  not tested Patient leans: N/A     Psychiatric Specialty Exam:  Presentation  General Appearance:  Appropriate for Environment  Eye Contact: Good  Speech: Clear and Coherent  Speech Volume: Normal  Handedness: Right   Mood and Affect  Mood: Anxious  Affect: Congruent   Thought Process  Thought Processes: Goal Directed  Descriptions of  Associations:Intact  Orientation:Full (Time, Place and Person)  Thought Content:Logical  History of Schizophrenia/Schizoaffective disorder:No data recorded Duration of Psychotic Symptoms:No data recorded Hallucinations:Hallucinations: None  Ideas of Reference:None  Suicidal Thoughts:Suicidal Thoughts: No  Homicidal Thoughts:Homicidal Thoughts: No   Sensorium  Memory: Immediate Good; Recent Good; Remote Good  Judgment: Intact  Insight: Fair   Art therapist  Concentration: Fair  Attention Span: Good  Recall: Good  Fund of Knowledge: Good  Language: Good   Psychomotor Activity  Psychomotor Activity: Psychomotor Activity: Normal   Assets  Assets: Communication Skills; Desire for Improvement   Sleep  Sleep: Sleep: Fair   Physical Exam: Physical Exam ROS Blood pressure (!) 135/91, pulse 84, temperature 98 F (36.7 C), temperature source Oral, resp. rate 20, height 6' (1.829 m), weight 83 kg, SpO2 95 %. Body mass index is 24.82 kg/m.  Treatment Plan Summary: Patient demonstrate a clear understanding of his current medical condition and appreciate how the information provided by the providers applies to his current situation. He has weighed the risks and benefits and communicating his choice for a comprehensive treatment in order to get stable. As a result, patient has capacity to make an informed medical decision for himself.  Recommendations: -Continue Buspar 15 mg TID  -Consider Optimizing treatment for medical issues and chronic pain   Disposition: No evidence of imminent risk to self or others at present.   Patient does not meet criteria for  psychiatric inpatient admission. Supportive therapy provided about ongoing stressors. Psychiatric service signing out. Re-consult as needed  Thedore Mins, MD 08/13/2022 2:03 PM

## 2022-08-13 NOTE — Progress Notes (Signed)
PROGRESS NOTE                                                                                                                                                                                                             Patient Demographics:    David [name], is a 62 y.o. male, DOB - [birth year], ZOX:[id number]  Outpatient Primary MD for the patient is [name], MD    LOS - 32  Admit date - 07/12/2022    Chief Complaint  Patient presents with   Shortness of Breath       Brief Narrative (HPI from H&P)   PMH of chronic combined CHF, chronic A-fib on Eliquis, type II DM, prior amputation of right foot, HTN.  Present to the hospital with complaints of fever.  Found to have MRSA bacteremia as well as multiple abscesses.  Originally admitted at [facility] on 4/9.  Blood cultures came positive for MRSA, his further workup suggested that patient had bilateral pneumonia and right-sided parapneumonic effusion, T7-T9 discitis and phlegmon, also had right foot infection.  He was seen by pulmonary, IR, neurosurgery and ID along with orthopedic surgery.    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  He was transferred to my care on 08/11/2022 on day 30 of his hospital stay.   Significant procedures and events  4/10 ID was consulted. 4/12 TTE was negative, TEE was done which was negative for vegetation 4/18 nephrology consulted for AKI. 4/22 neurosurgery [facility]/ Dr Yetta Barre were consulted. Recommend medical management with IV antibiotics only without any intervention. Neurosurgery was consulted and recommend conservative measures. 4/23 Dr. [name] was consulted for amputation.  Vascular surgery was consulted for concerns for PVD. 4/24 IR placed right-sided chest tube for pleural effusion 4/25 underwent  right below-knee popliteal artery, tibioperoneal trunk and peroneal angioplasty with Dr. Chestine Spore. 4/26 IR remove the chest tube.  Follow-up chest x-ray still shows pleural effusions. 4/27 patient currently does not want to perform the procedure with Dr. [name].  Informed Dr. [name] as well as consulted Dr. Linna Caprice. 4/29: Patient agreeable to proceed with amputation surgery with orthopedic surgeon Dr. Odis Hollingshead 4/30: Patient underwent right fifth ray revision amputation. 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 2. Thin epidural phlegmon/abscess along the posterior T7 and T8 endplates. 3. New mild pathologic compression deformity of the T8 vertebral body with 3-4 mm retropulsion of the posterior endplate which combined with the phlegmon/absces results in mild spinal canal stenosis 08/11/2022.  Transferred to my service on day 30 of his hospital stay.   Subjective:   Patient in bed denies any headache, chronic back pain, feels constipated and wants relief from it, no focal weakness.   Assessment  & Plan :    Sepsis secondary to MRSA bacteremia.  Potential sources T7-T9 discitis and phlegmon, right foot infection and osteomyelitis, right-sided pneumonia and parapneumonic effusion.  He had sepsis upon admission which has resolved - Case discussed with ID Dr. Algis Liming on 08/12/2022.  T7 T9 discitis.  He was seen by neurosurgery Dr. Yetta Barre who recommended medical management for his discitis and phlegmon, ID is managing IV antibiotics currently on combination of daptomycin and doxycycline.  Right foot infection and osteomyelitis.  Patient was seen by orthopedic surgery, underwent right fifth ray amputation on 08/02/2022 by Dr. Odis Hollingshead, continue antibiotics along with supportive care.  History of bilateral pneumonia with right-sided parapneumonic effusion.  Seen by pulmonary critical care.  Had chest  tube placed by IR on 07/26/2024 and chest tube was removed on 07/28/2024.  Clinically stable from the standpoint.   Acute on chronic pain.  Currently on combination of  IV Dilaudid as well as oxycodone and scheduled Robaxin, lyrica, and OxyContin every 12, he is on multiple high doses of narcotics and doses adjusted further on 08/12/2022 to avoid overdose.  Constipation with narcotic bowel.  Placed on Movantik and bowel regimen.  He had 2 bowel movements on 08/11/2022, passing flatus, no abdominal pain but requesting an enema 08/12/2022, will order SMO G and M on 08/12/2022.  Flat affect with signs of depression.  Not suicidal or homicidal, according to nursing staff and in my interview patient changes his subjective complaints and treatment desires from hour to hour, has changed at least 10-15 times in the last 2 days, question his capacity and insight, will request psych to evaluate him as well.    Anemia of chronic disease.  Stable.  Paroxysmal atrial fibrillation.  [place] vas 2 score of greater than 3.  Currently on digoxin, is beta-blocker along with Eliquis.  Hypertension.  Poorly controlled blood pressure adjusted beta-blocker and added Norvasc for better control.  Dyslipidemia.  On statin.    Acute on chronic combined CHF EF 60%.  Now resolved and compensated, on Coreg continue.  Currently not on diuretics.  Hyponatremia with AKI.  Dehydrated improved after IV fluids.  Peripheral vascular disease with critical limb ischemia.  Vascular surgery was consulted by Dr. [name] Underwent angiogram 4/25 with angioplasty of right below-knee popliteal artery, tibioperoneal trunk, and peroneal artery, Recommendation for aspirin, Plavix (>>Eliquis) and statin DW Dr. Chestine Spore 08/10/24.  DM type II.  On Lantus, Premeal NovoLog and sliding scale.  Monitor and adjust.  Lab Results  Component Value Date   HGBA1C 9.1 (H) 07/13/2022   CBG (last 3)  Recent Labs    08/12/22 2158 08/12/22 2256 08/13/22 0825   GLUCAP 62* 110* 96          Condition - Extremely Guarded  Family Communication  : Called the listed phone number for patient's friend [phone]  on 08/12/2022, phone number is disconnected.  Code Status :  Full  Consults  :  ID, orthopedics, neurosurgery, psychiatry, palliative care  PUD Prophylaxis :    Procedures  :     MRI - 1. 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 2. Thin epidural phlegmon/abscess along the posterior T7 and T8 endplates. 3. New mild pathologic compression deformity of the T8 vertebral body with 3-4 mm retropulsion of the posterior endplate which combined with the phlegmon/absces results in mild spinal canal stenosis. 4. Interval decrease in size of the lobulated fluid collection in the paraspinal soft tissues at T7-T9. 5. Complex right pleural effusion, incompletely imaged but at least somewhat decreased in size compared to the prior MRI.       Disposition Plan  :    Status is: Inpatient   DVT Prophylaxis  :     apixaban (ELIQUIS) tablet 5 mg     Lab Results  Component Value Date   PLT 379 08/13/2022    Diet :  Diet Order             Diet Carb Modified Fluid consistency: Thin; Room service appropriate? Yes; Fluid restriction: 1500 mL Fluid  Diet effective now                    Inpatient Medications  Scheduled Meds:  acetaminophen  650 mg Oral Q6H   amLODipine  10 mg Oral Daily   apixaban  5 mg Oral BID   aspirin EC  81 mg Oral Daily   atorvastatin  80 mg Oral Daily   budesonide (PULMICORT) nebulizer solution  0.5 mg Nebulization BID   busPIRone  15 mg Oral TID   carvedilol  6.25 mg Oral BID   Chlorhexidine Gluconate Cloth  6 each Topical Daily   digoxin  125 mcg Oral Daily   docusate sodium  200 mg Oral BID   doxycycline  100 mg Oral Q12H   famotidine  20 mg Oral BID   feeding supplement (GLUCERNA  SHAKE)  237 mL Oral TID BM   ferrous sulfate  325 mg Oral BID WC   folic acid  1 mg Oral Daily   ibuprofen  400 mg Oral TID   insulin aspart  0-20 Units Subcutaneous TID WC   insulin aspart  3 Units Subcutaneous TID WC   insulin glargine-yfgn  35 Units Subcutaneous Daily   lactulose  30 g Oral TID   lidocaine  1 patch Transdermal Daily   magnesium hydroxide  30 mL Oral BID   methocarbamol  500 mg Oral TID   naloxegol oxalate  25 mg Oral Daily   oxyCODONE  10 mg Oral Q12H   polyethylene glycol  17 g Oral BID   pregabalin  50 mg Oral TID   senna-docusate  2 tablet Oral BID   Continuous Infusions:  DAPTOmycin (CUBICIN) 800 mg in sodium chloride 0.9 % IVPB Stopped (08/12/22 2038)   PRN Meds:.Glycerin (Adult), HYDROmorphone (DILAUDID) injection, hydrOXYzine, ipratropium-albuterol, labetalol, melatonin, Muscle Rub, ondansetron **OR** [DISCONTINUED] ondansetron (ZOFRAN) IV, mouth rinse, oxyCODONE, sodium chloride flush    Objective:   Vitals:   08/13/22 0300 08/13/22 0500 08/13/22 0657 08/13/22 0826  BP:   (!) 176/93 (!) 162/99  Pulse: 84     Resp:      Temp:    98 F (36.7 C)  TempSrc:    Oral  SpO2: 95%     Weight:  83 kg    Height:  Wt Readings from Last 3 Encounters:  08/13/22 83 kg  07/09/22 81.6 kg  07/01/22 83.5 kg     Intake/Output Summary (Last 24 hours) at 08/13/2022 1058 Last data filed at 08/13/2022 0347 Gross per 24 hour  Intake 1568.33 ml  Output 2700 ml  Net -1131.67 ml     Physical Exam  Awake Alert, No new F.N deficits, Normal affect [place].AT,PERRAL Supple Neck, No JVD,   Symmetrical Chest wall movement, Good air movement bilaterally, CTAB RRR,No Gallops,Rubs or new Murmurs,  +ve B.Sounds, Abd Soft, No tenderness,   No Cyanosis, Clubbing or edema        Data Review:    Recent Labs  Lab 08/09/22 0135 08/10/22 0121 08/11/22 0627 08/12/22 0530 08/13/22 0253  WBC 12.3* 11.2* 12.0* 11.0* 10.4  HGB 10.8* 9.7* 8.9* 9.2* 10.0*  HCT 32.9*  28.7* 26.3* 26.4* 30.0*  PLT 473* 401* 357 352 379  MCV 88.7 86.7 86.2 86.8 88.0  MCH 29.1 29.3 29.2 30.3 29.3  MCHC 32.8 33.8 33.8 34.8 33.3  RDW 14.5 14.4 14.6 14.6 14.8  LYMPHSABS  --   --  1.1 1.2 0.8  MONOABS  --   --  1.2* 1.2* 1.0  EOSABS  --   --  0.2 0.1 0.2  BASOSABS  --   --  0.1 0.1 0.1    Recent Labs  Lab 08/09/22 0135 08/10/22 0121 08/11/22 0627 08/12/22 0530 08/13/22 0253  NA 127* 127* 126* 125* 133*  K 4.0 4.4 4.8 4.0 3.9  CL 94* 93* 94* 96* 99  CO2 24 23 23  21* 24  ANIONGAP 9 11 9 8 10   GLUCOSE 244* 246* 201* 245* 113*  BUN 20 25* 28* 24* 14  CREATININE 1.66* 1.56* 1.81* 1.65* 1.31*  AST 21 20 14* 15 18  ALT 13 13 11 12 12   ALKPHOS 114 94 91 93 102  BILITOT 0.9 0.9 0.8 0.8 0.6  ALBUMIN 2.3* 2.1* 2.0* 2.0* 2.2*  CRP  --   --  9.7* 10.8* 9.3*  PROCALCITON  --   --  0.12 0.17 0.14  BNP  --   --   --  452.7* 477.1*  MG  --   --  1.8 1.9 1.9  CALCIUM 9.6 9.4 9.1 9.0 9.8   Lab Results  Component Value Date   HGBA1C 9.1 (H) 07/13/2022     Radiology Reports DG Abd Portable 1V  Result Date: 08/13/2022 CLINICAL DATA:  Shortness of breath and constipation. EXAM: PORTABLE ABDOMEN - 1 VIEW COMPARISON:  CT 07/12/2022 FINDINGS: The bowel gas pattern appears nonobstructed. Gas and stool is noted throughout the colon and rectum. No pathologic dilatation of the large or small bowel loops noted. IMPRESSION: Nonobstructive bowel gas pattern. Electronically Signed   By: Signa Kell M.D.   On: 08/13/2022 08:27   US RENAL  Result Date: 08/12/2022 CLINICAL DATA:  Acute kidney injury EXAM: RENAL / URINARY TRACT ULTRASOUND COMPLETE COMPARISON:  CT abdomen pelvis with contrast 07/12/2022 FINDINGS: Right Kidney: Renal measurements: 11.2 x 6.0 x 6.2 cm = volume: 179 mL. Echogenicity within normal limits. No mass or hydronephrosis visualized. Left Kidney: Renal measurements: 13.0 x 6.1 x 5.1 cm = volume: 209 mL. Echogenicity within normal limits. No mass or hydronephrosis  visualized. Bladder: Appears normal for degree of bladder distention. Other: None. IMPRESSION: No significant sonographic abnormality of the kidneys. Electronically Signed   By: Acquanetta Belling M.D.   On: 08/12/2022 15:00   DG Chest [facility]  Result Date: 08/11/2022 CLINICAL DATA:  Shortness of breath. EXAM: PORTABLE CHEST 1 VIEW COMPARISON:  07/31/2022 FINDINGS: Stable cardiac enlargement. Pulmonary vascular congestion. Unchanged appearance of right pleural effusion with right mid and right lower lung opacities. Left lung appears clear. IMPRESSION: 1. Persistent right pleural effusion and right mid and right lower lung opacities. 2. Cardiac enlargement and pulmonary vascular congestion. Electronically Signed   By: Signa Kell M.D.   On: 08/11/2022 06:50      Signature  -   Susa Raring M.D on 08/13/2022 at 10:58 AM   -  To page go to [email or web address]

## 2022-08-13 NOTE — Consult Note (Cosign Needed Addendum)
Palliative Medicine Inpatient Consult Note  Consulting Provider: Dr. Joseph Art  Reason for consult:   Palliative Care Consult Services Palliative Medicine Consult  Reason for Consult? See below   patient refuses to obtain health insurance through his work, does not qualify for Medicare/Medicaid, refuses to obtain outside healthcare.  Refuses to pay his previous bills therefore healthcare agencies will not except requests for home health care needs i.e. IV antibiotics etc. evaluate for change of CODE STATUS to DNR, evaluate for home hospice   08/13/2022  HPI:  Per intake H&P --> David [name] is a 62 y.o. M with a past medical history of chronic combined CHF, chronic A-fib on Eliquis, type II DM, prior amputation of right foot, HTN.    Present to the [name] with complaints of fever on 07/12/22.  Found to have MRSA bacteremia as well as multiple abscesses.  Originally admitted at [facility]  though blood cultures came positive for MRSA, his further workup suggested that patient had bilateral pneumonia and right-sided parapneumonic effusion, T7-T9 discitis and phlegmon, also had right foot infection.  He was seen by pulmonary, IR, neurosurgery and ID along with orthopedic surgery.  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   Palliative care was consulted for further goals of care conversations in the setting of acute on chronic disease burden.  Clinical Assessment/Goals of Care:  *Please note that this is a verbal dictation therefore any spelling or grammatical errors are due to the "Dragon Medical One" system interpretation.  I have reviewed medical records including EPIC notes, labs and imaging, received report from bedside RN, assessed the patient who is lying in bed telling me he is having horrific pain.    I met with David [name] to further discuss diagnosis  prognosis, GOC, EOL wishes, disposition and options.   I introduced Palliative Medicine as specialized medical care for people living with serious illness. It focuses on providing relief from the symptoms and stress of a serious illness. The goal is to improve quality of life for both the patient and the family.  Medical History Review and Understanding:  A review of David [name]'s past medical history inclusive of his atrial fibrillation, combined heart failure, hypertension, amputation surgeries of his right foot, and prolonged hospitalization due to MRSA associated pneumonia.  Social History:  David [name] shares with me that he is originally from [place].  His family though is from the [place] area.  He is married though has been separated from his wife for 4 years at which time he was living with his sister.  He shares that they have recently been making plans to reconcile and have been living in a motel.  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  He shares that he is a man of faith and practices within Christianity.  Functional and Nutritional State:  Preceding the beginning of this year David [name] shares he was fully functional able to do all B ADLs and IADLs autonomously.  He expresses that he was still working and maintaining a job.  He shares as above that he and his wife had become the process of reconciling after 4 years of separation.  He shares that he had been eating and drinking well.  Advance Directives:  A detailed discussion was had today regarding advanced directives.  David [name] does not have advanced directives on file though he  would like his wife to be his primary decision maker.  Will request chaplain help to complete these documents given their separation  Code Status:  Concepts specific to code status, artifical feeding and hydration, continued IV antibiotics and rehospitalization was had.  The difference between a aggressive medical  intervention path  and a palliative comfort care path for this patient at this time was had.   Encouraged patient/family to consider DNR/DNI status understanding evidenced based poor outcomes in similar hospitalized patient, as the cause of arrest is likely associated with advanced chronic/terminal illness rather than an easily reversible acute cardio-pulmonary event. I explained that DNR/DNI does not change the medical plan and it only comes into effect after a person has arrested (died).  It is a protective measure to keep [place] from harming the patient in their last moments of life.  David [name] was agreeable to DNR/DNI with understanding that patient would not receive CPR, defibrillation, ACLS medications, or intubation.   Discussion:  David [name] shares with me that his life got quite complicated in the setting of an infection to his right toe.  He shares that once the MRSA was identified he has recurrently been hospitalized and it has been a downhill spiral of the fence.  He reviews with me the [name] stay he has endured over the last 32 days inclusive of MRSA infection to his lungs, spine, his right foot.  He expresses that this bacteria affected his whole body.  At this time David [name] shares that he feels rather hopeless and is worried if he does not start to improve from a pain and constipation perspective that he will have no hope in terms of life and living.I shared that these are issues we are working on today. Encouraged mobility which David [name] shares if very limited by the amount of pain he is experiencing.   A discussion of best case and worst case scenarios was held. David [name] shares if the rest of his life is him confined to a bed then he would have no quality of life. If this were the case he would not want to live. We discussed the idea of hospice if his clinical condition were to worsen or he were to acutely decline. He shares that if this were the case he would be in agreement with it.   In the meanwhile, we  discussed the importance of continuing efforts to improve David [name]'s condition. We discussed a comprehensive approach to symptom management and ongoing support.   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   I was able to assist David [name] with brushing his teeth this morning.   Discussed the importance of continued conversation with family and their  medical providers regarding overall plan of care and treatment options, ensuring decisions are within the context of the patients values and GOCs. ___________________________________ Addendum:  I was asked to see David [name] again this afternoon. I went to bedside and he shared that he was in throbbing back pain. He was able to have a large bowel movement though at this point he feels that his pain has flared up as a result of this.   I was able to speak to patients RN, Francena Hanly and ensure he would get oxycodone.   Additional Time: 23 ____________________________________ Addendum #2:  I met with patient at bedside as he had called the Palliative service requesting my presence. We review his present concerns in the setting of being set up for dinner.  I was able to communicate with him in terms of his pain management - he does feel slight improvements as long at the nursing team is able to stay on top of his needs. He additionally shares he got some sleep and has more of an appetite this evening.   I was able to offer support through presence.   Additional Time: 30  Contact(s): David [name], David [name] (Friend): [phone] (Mobile)   SUMMARY OF RECOMMENDATIONS   DNAR/DNI  Chaplain to support completion of AD's and offer prayer  Symptom management as below  Ongoing supportive care  PMT will continue to follow along  Code Status/Advance Care Planning: DNAR/DNI   Symptom Management:  Acute on Chronic Pain in the setting of Discitis: - Tylenol 650mg  PO Q6H ATC - Dilaudid 0.5mg  IVP Q6H PRN - Robaxin 500mg   TID - Oxycodone 5-10mg  PO Q4H PRN - Oxycontin 20mg  PO Q12H  - Lyrica 50mg  TID - Muscle Rub as needed - Ibuprofen 400mg  PO Q8H ATC for three day trial in the setting of kidney fx - Lidoderm patch -K Pad   Opoid induced constipation: - Sorbitol enema today - Senna 2 Tabs PO BID - Movantik 25mg  QDay - MOM twice daily - Lactulose 30ml TID - Miralax BID  Anxiety: - Buspar 15mg  PO TID - Atarax 25mg  PO TID PRN - Plan for psychiatry to see David [name] today  Palliative Prophylaxis:  Aspiration, Bowel Regimen, Delirium Protocol, Frequent Pain Assessment, Oral Care, Palliative Wound Care, and Turn Reposition  Additional Recommendations (Limitations, Scope, Preferences): Continue current care  Psycho-social/Spiritual:  Desire for further Chaplaincy support: Yes - [facility] Additional Recommendations: Education on disease burden   Prognosis: Unclear  Discharge Planning: Discharge will be to CIR once medically optimized  Vitals:   08/13/22 0657 08/13/22 0826  BP: (!) 176/93 (!) 162/99  Pulse:    Resp:    Temp:  98 F (36.7 C)  SpO2:      Intake/Output Summary (Last 24 hours) at 08/13/2022 1005 Last data filed at 08/13/2022 0347 Gross per 24 hour  Intake 1568.33 ml  Output 2700 ml  Net -1131.67 ml   Last Weight  Most recent update: 08/13/2022  6:48 AM    Weight  83 kg (182 lb 15.7 oz)            Gen:  Older Caucasian M in distress HEENT: moist mucous membranes CV: Regular rate and rhythm  PULM:  On RA< breathing is even and nonlabored ABD: Distended and hypoactive EXT: Missing R toe 4-5   Neuro: Alert and oriented x3   PPS: 50%   This conversation/these recommendations were discussed with patient primary care team, Dr. Thedore Mins  Total Time: 95 Billing based on MDM: High _________________________________________ Lamarr Lulas [facility] Palliative Medicine Team Team Cell Phone: [phone] Please utilize secure chat with additional questions, if there is no  response within 30 minutes please call the above phone number  Palliative Medicine Team providers are available by phone from 7am to 7pm daily and can be reached through the team cell phone.  Should this patient require assistance outside of these hours, please call the patient's attending physician.

## 2022-08-14 LAB — PROCALCITONIN: Procalcitonin: 0.1 ng/mL

## 2022-08-14 LAB — COMPREHENSIVE METABOLIC PANEL
ALT: 15 U/L (ref 0–44)
AST: 19 U/L (ref 15–41)
Albumin: 2.1 g/dL — ABNORMAL LOW (ref 3.5–5.0)
Alkaline Phosphatase: 93 U/L (ref 38–126)
Anion gap: 6 (ref 5–15)
BUN: 12 mg/dL (ref 6–20)
CO2: 23 mmol/L (ref 22–32)
Calcium: 9.4 mg/dL (ref 8.9–10.3)
Chloride: 101 mmol/L (ref 98–111)
Creatinine, Ser: 1.32 mg/dL — ABNORMAL HIGH (ref 0.61–1.24)
GFR, Estimated: >60 mL/min (ref >60)
Glucose, Bld: 100 mg/dL — ABNORMAL HIGH (ref 70–99)
Potassium: 3.8 mmol/L (ref 3.5–5.1)
Sodium: 130 mmol/L — ABNORMAL LOW (ref 135–145)
Total Bilirubin: 0.8 mg/dL (ref 0.3–1.2)
Total Protein: 7.1 g/dL (ref 6.5–8.1)

## 2022-08-14 LAB — PHOSPHORUS: Phosphorus: 4.2 mg/dL (ref 2.5–4.6)

## 2022-08-14 LAB — GLUCOSE, CAPILLARY
Glucose-Capillary: 131 mg/dL — ABNORMAL HIGH (ref 70–99)
Glucose-Capillary: 170 mg/dL — ABNORMAL HIGH (ref 70–99)
Glucose-Capillary: 86 mg/dL (ref 70–99)
Glucose-Capillary: 140 mg/dL — ABNORMAL HIGH (ref 70–99)

## 2022-08-14 LAB — CBC WITH DIFFERENTIAL/PLATELET
Abs Immature Granulocytes: 0.07 10*3/uL (ref 0.00–0.07)
Basophils Absolute: 0.1 10*3/uL (ref 0.0–0.1)
Basophils Relative: 1 %
Eosinophils Absolute: 0.2 10*3/uL (ref 0.0–0.5)
Eosinophils Relative: 3 %
HCT: 29.4 % — ABNORMAL LOW (ref 39.0–52.0)
Hemoglobin: 9.6 g/dL — ABNORMAL LOW (ref 13.0–17.0)
Immature Granulocytes: 1 %
Lymphocytes Relative: 16 %
Lymphs Abs: 1.4 10*3/uL (ref 0.7–4.0)
MCH: 28.5 pg (ref 26.0–34.0)
MCHC: 32.7 g/dL (ref 30.0–36.0)
MCV: 87.2 fL (ref 80.0–100.0)
Monocytes Absolute: 1.2 10*3/uL — ABNORMAL HIGH (ref 0.1–1.0)
Monocytes Relative: 13 %
Neutro Abs: 6.2 10*3/uL (ref 1.7–7.7)
Neutrophils Relative %: 66 %
Platelets: 390 10*3/uL (ref 150–400)
RBC: 3.37 MIL/uL — ABNORMAL LOW (ref 4.22–5.81)
RDW: 14.6 % (ref 11.5–15.5)
WBC: 9.2 10*3/uL (ref 4.0–10.5)
nRBC: 0 % (ref 0.0–0.2)

## 2022-08-14 LAB — MAGNESIUM: Magnesium: 1.9 mg/dL (ref 1.7–2.4)

## 2022-08-14 LAB — BRAIN NATRIURETIC PEPTIDE: B Natriuretic Peptide: 335.1 pg/mL — ABNORMAL HIGH (ref 0.0–100.0)

## 2022-08-14 LAB — C-REACTIVE PROTEIN: CRP: 8 mg/dL — ABNORMAL HIGH (ref <1.0)

## 2022-08-14 MED ORDER — OXYCODONE HCL ER 20 MG PO T12A
20.0000 mg | EXTENDED_RELEASE_TABLET | Freq: Two times a day (BID) | ORAL | Status: DC
  Administered 2022-08-14 – 2022-08-19 (×10): 20 mg via ORAL
  Filled 2022-08-14 (×10): qty 1

## 2022-08-14 NOTE — Progress Notes (Addendum)
Palliative Medicine Inpatient Follow Up Note HPI: David [name] is a 62 y.o. M with a past medical history of chronic combined CHF, chronic A-fib on Eliquis, type II DM, prior amputation of right foot, HTN.     Present to the hospital with complaints of fever on 07/12/22.  Found to have MRSA bacteremia as well as multiple abscesses.  Originally admitted at [facility]  though blood cultures came positive for MRSA, his further workup suggested that patient had bilateral pneumonia and right-sided parapneumonic effusion, T7-T9 discitis and phlegmon, also had right foot infection.  He was seen by pulmonary, IR, neurosurgery and ID along with orthopedic surgery.  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    Palliative care was consulted for further goals of care conversations in the setting of acute on chronic disease burden.  Today's Discussion 08/14/2022  *Please note that this is a verbal dictation therefore any spelling or grammatical errors are due to the "Dragon Medical One" system interpretation.  Chart reviewed inclusive of vital signs, progress notes, laboratory results, and diagnostic images.   I met with David [name] at bedside this morning. He was in much better spirits and feels that his pain has been under better control in the last 24 hours. He shares he is more willing to participate in mobility efforts. We discussed his current pain regiment - the only worry he has is from the hours of 4:30AM to 8:30 AM. We discussed changing his long acting to be given at 4:30 in the morning and 16:30 in the evening. We reviewed getting a kPAD for his back to the heat is consistent.   David [name] shares he feels great relief after the bowel movements he experienced yesterday.   David [name] is able to eat this morning.   We discussed the hope for him to transition to inpatient rehabilitation  though this is a process and his chart is still under review.   Discussed allowing the staff to know when he has a need.  Created space and opportunity for patient to explore thoughts feelings and fears regarding current medical situation.  We reviewed that I will not be back tomorrow though I will request a colleague check in.   Questions and concerns addressed/Palliative Support Provided.   Objective Assessment: Vital Signs Vitals:   08/14/22 0816 08/14/22 1122  BP: 113/77 (!) 122/97  Pulse: 64 (!) 58  Resp: 16 18  Temp: 98.2 F (36.8 C)   SpO2:      Intake/Output Summary (Last 24 hours) at 08/14/2022 1500 Last data filed at 08/14/2022 0602 Gross per 24 hour  Intake 1064 ml  Output --  Net 1064 ml   Last Weight  Most recent update: 08/14/2022  4:00 AM    Weight  76.9 kg (169 lb 8.5 oz)            Gen:  Older Caucasian M in distress HEENT: moist mucous membranes CV: Regular rate and rhythm  PULM:  On RA, breathing is even and nonlabored ABD: Distended and hypoactive EXT: Missing R toe 4-5 , ace wrap on Neuro: Alert and oriented x3   SUMMARY OF RECOMMENDATIONS   DNAR/DNI   Chaplain to support completion of AD's and offer prayer   Symptom management as below   Ongoing supportive care  Appreciate CIR consideration as a charity case   PMT will continue to follow along   Code  Status/Advance Care Planning: DNAR/DNI   Symptom Management:  Acute on Chronic Pain in the setting of Discitis: - Tylenol 650mg  PO Q6H ATC - Dilaudid 0.5mg  IVP Q6H PRN - Robaxin 500mg  TID - Oxycodone 5-10mg  PO Q4H PRN - Oxycontin 20mg  PO Q12H  - Lyrica 50mg  TID - Muscle Rub as needed - Ibuprofen 400mg  PO Q8H ATC for three day trial in the setting of kidney fx - Lidoderm patch -K Pad    Opoid induced constipation: - Senna 2 Tabs PO BID - MOM twice daily - Lactulose 30ml TID - Miralax BID   Anxiety: - Buspar 15mg  PO TID - Atarax 25mg  PO TID PRN - Seen by psychiatry  5/11  Billing based on MDM: High ______________________________________________________________________________________ [name] Palliative Medicine Team Team Cell Phone: [phone] Please utilize secure chat with additional questions, if there is no response within 30 minutes please call the above phone number  Palliative Medicine Team providers are available by phone from 7am to 7pm daily and can be reached through the team cell phone.  Should this patient require assistance outside of these hours, please call the patient's attending physician.

## 2022-08-14 NOTE — Progress Notes (Signed)
PROGRESS NOTE                                                                                                                                                                                                             Patient Demographics:    David [name], is a 62 y.o. male, DOB - September 03, 1960, ZOX:[id number]  Outpatient Primary MD for the patient is [name], MD    LOS - 33  Admit date - 07/12/2022    Chief Complaint  Patient presents with   Shortness of Breath       Brief Narrative (HPI from H&P)   PMH of chronic combined CHF, chronic A-fib on Eliquis, type II DM, prior amputation of right foot, HTN.  Present to the hospital with complaints of fever.  Found to have MRSA bacteremia as well as multiple abscesses.  Originally admitted at [facility] on 4/9.  Blood cultures came positive for MRSA, his further workup suggested that patient had bilateral pneumonia and right-sided parapneumonic effusion, T7-T9 discitis and phlegmon, also had right foot infection.  He was seen by pulmonary, IR, neurosurgery and ID along with orthopedic surgery.    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  He was transferred to my care on 08/11/2022 on day 30 of his hospital stay.   Significant procedures and events  4/10 ID was consulted. 4/12 TTE was negative, TEE was done which was negative for vegetation 4/18 nephrology consulted for AKI. 4/22 neurosurgery [facility]/ Dr Yetta Barre were consulted. Recommend medical management with IV antibiotics only without any intervention. Neurosurgery was consulted and recommend conservative measures. 4/23 Dr. [name] was consulted for amputation.  Vascular surgery was consulted for concerns for PVD. 4/24 IR placed right-sided chest tube for pleural effusion 4/25 underwent  right below-knee popliteal artery, tibioperoneal trunk and peroneal angioplasty with Dr. Chestine Spore. 4/26 IR remove the chest tube.  Follow-up chest x-ray still shows pleural effusions. 4/27 patient currently does not want to perform the procedure with Dr. [name].  Informed Dr. [name] as well as consulted Dr. Linna Caprice. 4/29: Patient agreeable to proceed with amputation surgery with orthopedic surgeon Dr. Odis Hollingshead 4/30: Patient underwent right fifth ray revision amputation. 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 2. Thin epidural phlegmon/abscess along the posterior T7 and T8 endplates. 3. New mild pathologic compression deformity of the T8 vertebral body with 3-4 mm retropulsion of the posterior endplate which combined with the phlegmon/absces results in mild spinal canal stenosis 08/11/2022.  Transferred to my service on day 30 of his hospital stay.   Subjective:   Patient in bed appears to be in no distress denies any headache chest or abdominal pain, lower back pain better, had a good bowel movement and feeds cleaned out.   Assessment  & Plan :    Sepsis secondary to MRSA bacteremia.  Potential sources T7-T9 discitis and phlegmon, right foot infection and osteomyelitis, right-sided pneumonia and parapneumonic effusion.  He had sepsis upon admission which has resolved - Case discussed with ID Dr. Algis Liming on 08/12/2022.  T7 T9 discitis.  He was seen by neurosurgery Dr. Yetta Barre who recommended medical management for his discitis and phlegmon, ID is managing IV antibiotics currently on combination of daptomycin and doxycycline.  Right foot infection and osteomyelitis.  Patient was seen by orthopedic surgery, underwent right fifth ray amputation on 08/02/2022 by Dr. Odis Hollingshead, continue antibiotics along with supportive care.  History of bilateral pneumonia with right-sided parapneumonic effusion.   Seen by pulmonary critical care.  Had chest tube placed by IR on 07/26/2024 and chest tube was removed on 07/28/2024.  Clinically stable from the standpoint.   Acute on chronic pain.  Currently on combination of  IV Dilaudid as well as oxycodone and scheduled Robaxin, lyrica, and OxyContin every 12, he is on multiple high doses of narcotics and doses, being managed by palliative care.  Discussed his pain control with nurse Francena Hanly in room on 08/14/2022.  He confirms that is wishing to gradually come off of narcotics for good and that he prefers them being tapered down gradually.  Constipation with narcotic bowel.  Placed on Movantik and bowel regimen.  He had 2 bowel movements on 08/11/2022, passing flatus, no abdominal pain but requesting an enema 08/12/2022, he received smog enema on 08/13/2022 with good results  Flat affect with signs of depression.  Not suicidal or homicidal, according to nursing staff and in my interview patient changes his subjective complaints and treatment desires from hour to hour, seen and cleared by Psych.  Anemia of chronic disease.  Stable.  Paroxysmal atrial fibrillation.  [place] vas 2 score of greater than 3.  Currently on digoxin, is beta-blocker along with Eliquis.  Hypertension.  Poorly controlled blood pressure adjusted beta-blocker and added Norvasc for better control.  Dyslipidemia.  On statin.    Acute on chronic combined CHF EF 60%.  Now resolved and compensated, on Coreg continue.  Currently not on diuretics.  Hyponatremia with AKI.  Dehydrated improved after IV fluids.  Peripheral vascular disease with critical limb ischemia.  Vascular surgery was consulted by Dr. [name] Underwent angiogram 4/25 with angioplasty of right below-knee popliteal artery, tibioperoneal trunk, and peroneal artery, Recommendation for aspirin, Plavix (>>Eliquis) and statin DW Dr. Chestine Spore 08/10/24.  DM type II.  On Lantus, Premeal NovoLog and sliding scale.  Monitor and adjust.  Lab Results   Component Value Date   HGBA1C 9.1 (H) 07/13/2022   CBG (last 3)  Recent Labs    08/13/22 1619 08/13/22 2129 08/14/22 0816  GLUCAP 91 89 131*          Condition - Extremely Guarded  Family Communication  : Called the listed phone  number for patient's friend [phone]  on 08/12/2022, phone number is disconnected.  Code Status :  Full  Consults  :  ID, orthopedics, neurosurgery, psychiatry, palliative care  PUD Prophylaxis :    Procedures  :     MRI - 1. 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 2. Thin epidural phlegmon/abscess along the posterior T7 and T8 endplates. 3. New mild pathologic compression deformity of the T8 vertebral body with 3-4 mm retropulsion of the posterior endplate which combined with the phlegmon/absces results in mild spinal canal stenosis. 4. Interval decrease in size of the lobulated fluid collection in the paraspinal soft tissues at T7-T9. 5. Complex right pleural effusion, incompletely imaged but at least somewhat decreased in size compared to the prior MRI.       Disposition Plan  :    Status is: Inpatient   DVT Prophylaxis  :     apixaban (ELIQUIS) tablet 5 mg     Lab Results  Component Value Date   PLT 390 08/14/2022    Diet :  Diet Order             Diet Carb Modified Fluid consistency: Thin; Room service appropriate? Yes; Fluid restriction: 1500 mL Fluid  Diet effective now                    Inpatient Medications  Scheduled Meds:  acetaminophen  650 mg Oral Q6H   amLODipine  10 mg Oral Daily   apixaban  5 mg Oral BID   aspirin EC  81 mg Oral Daily   atorvastatin  80 mg Oral Daily   busPIRone  15 mg Oral TID   carvedilol  6.25 mg Oral BID   Chlorhexidine Gluconate Cloth  6 each Topical Daily   digoxin  125 mcg Oral Daily   docusate sodium  200 mg Oral BID   doxycycline  100 mg Oral Q12H    famotidine  20 mg Oral BID   feeding supplement (GLUCERNA SHAKE)  237 mL Oral TID BM   ferrous sulfate  325 mg Oral BID WC   folic acid  1 mg Oral Daily   insulin aspart  0-20 Units Subcutaneous TID WC   insulin aspart  3 Units Subcutaneous TID WC   insulin glargine-yfgn  35 Units Subcutaneous Daily   lactulose  30 g Oral TID   lidocaine  1 patch Transdermal Daily   magnesium hydroxide  30 mL Oral BID   methocarbamol  500 mg Oral TID   naloxegol oxalate  25 mg Oral Daily   oxyCODONE  20 mg Oral Q12H   polyethylene glycol  17 g Oral BID   pregabalin  50 mg Oral TID   senna-docusate  2 tablet Oral BID   Continuous Infusions:  DAPTOmycin (CUBICIN) 800 mg in sodium chloride 0.9 % IVPB 800 mg (08/13/22 2044)   PRN Meds:.Glycerin (Adult), HYDROmorphone (DILAUDID) injection, hydrOXYzine, ipratropium-albuterol, labetalol, melatonin, Muscle Rub, naLOXone (NARCAN)  injection, ondansetron **OR** [DISCONTINUED] ondansetron (ZOFRAN) IV, mouth rinse, oxyCODONE, oxyCODONE, sodium chloride flush    Objective:   Vitals:   08/13/22 1622 08/13/22 2100 08/14/22 0331 08/14/22 0816  BP: 136/83 112/77 136/80 113/77  Pulse: 70 67 67 64  Resp: 18 18 18 16   Temp: 97.7 F (36.5 C) (!) 97.3 F (36.3 C) 97.8 F (36.6 C)   TempSrc: Oral Axillary Axillary Oral  SpO2:  97%  96%   Weight:   76.9 kg   Height:        Wt Readings from Last 3 Encounters:  08/14/22 76.9 kg  07/09/22 81.6 kg  07/01/22 83.5 kg     Intake/Output Summary (Last 24 hours) at 08/14/2022 0929 Last data filed at 08/14/2022 0602 Gross per 24 hour  Intake 1064 ml  Output --  Net 1064 ml     Physical Exam  Awake Alert, No new F.N deficits, Normal affect [place].AT,PERRAL Supple Neck, No JVD,   Symmetrical Chest wall movement, Good air movement bilaterally, CTAB RRR,No Gallops,Rubs or new Murmurs,  +ve B.Sounds, Abd Soft, No tenderness,   No Cyanosis, Clubbing or edema        Data Review:    Recent Labs  Lab  08/10/22 0121 08/11/22 0627 08/12/22 0530 08/13/22 0253 08/14/22 0341  WBC 11.2* 12.0* 11.0* 10.4 9.2  HGB 9.7* 8.9* 9.2* 10.0* 9.6*  HCT 28.7* 26.3* 26.4* 30.0* 29.4*  PLT 401* 357 352 379 390  MCV 86.7 86.2 86.8 88.0 87.2  MCH 29.3 29.2 30.3 29.3 28.5  MCHC 33.8 33.8 34.8 33.3 32.7  RDW 14.4 14.6 14.6 14.8 14.6  LYMPHSABS  --  1.1 1.2 0.8 1.4  MONOABS  --  1.2* 1.2* 1.0 1.2*  EOSABS  --  0.2 0.1 0.2 0.2  BASOSABS  --  0.1 0.1 0.1 0.1    Recent Labs  Lab 08/10/22 0121 08/11/22 0627 08/12/22 0530 08/13/22 0253 08/14/22 0341  NA 127* 126* 125* 133* 130*  K 4.4 4.8 4.0 3.9 3.8  CL 93* 94* 96* 99 101  CO2 23 23 21* 24 23  ANIONGAP 11 9 8 10 6   GLUCOSE 246* 201* 245* 113* 100*  BUN 25* 28* 24* 14 12  CREATININE 1.56* 1.81* 1.65* 1.31* 1.32*  AST 20 14* 15 18 19   ALT 13 11 12 12 15   ALKPHOS 94 91 93 102 93  BILITOT 0.9 0.8 0.8 0.6 0.8  ALBUMIN 2.1* 2.0* 2.0* 2.2* 2.1*  CRP  --  9.7* 10.8* 9.3* 8.0*  PROCALCITON  --  0.12 0.17 0.14 0.10  BNP  --   --  452.7* 477.1* 335.1*  MG  --  1.8 1.9 1.9 1.9  CALCIUM 9.4 9.1 9.0 9.8 9.4   Lab Results  Component Value Date   HGBA1C 9.1 (H) 07/13/2022     Radiology Reports DG Abd Portable 1V  Result Date: 08/13/2022 CLINICAL DATA:  Shortness of breath and constipation. EXAM: PORTABLE ABDOMEN - 1 VIEW COMPARISON:  CT 07/12/2022 FINDINGS: The bowel gas pattern appears nonobstructed. Gas and stool is noted throughout the colon and rectum. No pathologic dilatation of the large or small bowel loops noted. IMPRESSION: Nonobstructive bowel gas pattern. Electronically Signed   By: Signa Kell M.D.   On: 08/13/2022 08:27   US RENAL  Result Date: 08/12/2022 CLINICAL DATA:  Acute kidney injury EXAM: RENAL / URINARY TRACT ULTRASOUND COMPLETE COMPARISON:  CT abdomen pelvis with contrast 07/12/2022 FINDINGS: Right Kidney: Renal measurements: 11.2 x 6.0 x 6.2 cm = volume: 179 mL. Echogenicity within normal limits. No mass or hydronephrosis  visualized. Left Kidney: Renal measurements: 13.0 x 6.1 x 5.1 cm = volume: 209 mL. Echogenicity within normal limits. No mass or hydronephrosis visualized. Bladder: Appears normal for degree of bladder distention. Other: None. IMPRESSION: No significant sonographic abnormality of the kidneys. Electronically Signed   By: Acquanetta Belling M.D.   On: 08/12/2022 15:00   DG Chest [facility]  Result Date: 08/11/2022 CLINICAL DATA:  Shortness of breath. EXAM: PORTABLE CHEST 1 VIEW COMPARISON:  07/31/2022 FINDINGS: Stable cardiac enlargement. Pulmonary vascular congestion. Unchanged appearance of right pleural effusion with right mid and right lower lung opacities. Left lung appears clear. IMPRESSION: 1. Persistent right pleural effusion and right mid and right lower lung opacities. 2. Cardiac enlargement and pulmonary vascular congestion. Electronically Signed   By: Signa Kell M.D.   On: 08/11/2022 06:50      Signature  -   Susa Raring M.D on 08/14/2022 at 9:29 AM   -  To page go to [email or web address]

## 2022-08-14 NOTE — Progress Notes (Signed)
Mobility Specialist Progress Note    08/14/22 1308  Mobility  Activity Ambulated with assistance in hallway  Level of Assistance Minimal assist, patient does 75% or more  Assistive Device Front wheel walker  Distance Ambulated (ft) 300 ft  RLE Weight Bearing PWB  Activity Response Tolerated well  Mobility Referral Yes  $Mobility charge 1 Mobility  Mobility Specialist Start Time (ACUTE ONLY) 1215  Mobility Specialist Stop Time (ACUTE ONLY) 1307  Mobility Specialist Time Calculation (min) (ACUTE ONLY) 52 min   Pt received coming out of BR w/o brace and shoe on and agreeable. Pt minA to don TLSO and darco shoe. C/o 8-9/10 pain that decreased to 7/10 after medicine. Returned to chair and left with call bell in reach.   [place] Mobility Specialist  Please Neurosurgeon or Rehab Office at [phone]

## 2022-08-14 NOTE — TOC Progression Note (Signed)
Transition of Care [facility]) - Progression Note   Patient Details  Name: David [name] MRN: [id number] Date of Birth: 1960-08-05  [facility] CM/SW Contact  Helene Kelp, [place] Phone Number: 08/14/2022, 1:50 PM  Clinical Narrative:     CSW followed-up the patient's disposition per handoff. The CSW met with the patient to provide and review Medicaid application, along with reviewing his disposition. Pt informed the CSW he did not want to talk today about it, and was going back to sleep. Pt indicated he will talk tomorrow (08/15/22) about his disposition "if he is up to it".   Disposition follow-up: Please follow-up with the clinical team regarding the patient's disposition and communication with the pt's sister (per [facility] handoff)  No other needs identified at this current time for this Clinical research associate. Unit CSW to follow and continue efforts and progress made to support the patient's disposition.     Expected Discharge Plan: Home w Home Health Services Barriers to Discharge: Continued Medical Work up, Inadequate or no insurance (Needs IV abx)  Expected Discharge Plan and Services In-house Referral: Clinical Social Work Discharge Planning Services: NA Post Acute Care Choice: Home Health Living arrangements for the past 2 months: Hotel/Motel                 DME Arranged: IV pump/equipment DME Agency:  Julianne Rice) Date DME Agency Contacted: 07/16/22 Time DME Agency Contacted: 1430 Representative spoke with at DME Agency: Jeri Modena    Social Determinants of Health (SDOH) Interventions    Readmission Risk Interventions    09/06/2021   12:07 PM  Readmission Risk Prevention Plan  Post Dischage Appt Complete  Medication Screening Complete  Transportation Screening Complete

## 2022-08-15 DIAGNOSIS — Z515 Encounter for palliative care: Secondary | ICD-10-CM

## 2022-08-15 LAB — C-REACTIVE PROTEIN: CRP: 8.8 mg/dL — ABNORMAL HIGH (ref <1.0)

## 2022-08-15 LAB — GLUCOSE, CAPILLARY
Glucose-Capillary: 150 mg/dL — ABNORMAL HIGH (ref 70–99)
Glucose-Capillary: 71 mg/dL (ref 70–99)
Glucose-Capillary: 110 mg/dL — ABNORMAL HIGH (ref 70–99)
Glucose-Capillary: 194 mg/dL — ABNORMAL HIGH (ref 70–99)

## 2022-08-15 LAB — CBC WITH DIFFERENTIAL/PLATELET
Abs Immature Granulocytes: 0.08 10*3/uL — ABNORMAL HIGH (ref 0.00–0.07)
Basophils Absolute: 0.1 10*3/uL (ref 0.0–0.1)
Basophils Relative: 1 %
Eosinophils Absolute: 0.2 10*3/uL (ref 0.0–0.5)
Eosinophils Relative: 2 %
HCT: 30.9 % — ABNORMAL LOW (ref 39.0–52.0)
Hemoglobin: 10.2 g/dL — ABNORMAL LOW (ref 13.0–17.0)
Immature Granulocytes: 1 %
Lymphocytes Relative: 11 %
Lymphs Abs: 1.3 10*3/uL (ref 0.7–4.0)
MCH: 29.6 pg (ref 26.0–34.0)
MCHC: 33 g/dL (ref 30.0–36.0)
MCV: 89.6 fL (ref 80.0–100.0)
Monocytes Absolute: 1.4 10*3/uL — ABNORMAL HIGH (ref 0.1–1.0)
Monocytes Relative: 12 %
Neutro Abs: 8.3 10*3/uL — ABNORMAL HIGH (ref 1.7–7.7)
Neutrophils Relative %: 73 %
Platelets: 397 10*3/uL (ref 150–400)
RBC: 3.45 MIL/uL — ABNORMAL LOW (ref 4.22–5.81)
RDW: 14.6 % (ref 11.5–15.5)
WBC: 11.3 10*3/uL — ABNORMAL HIGH (ref 4.0–10.5)
nRBC: 0 % (ref 0.0–0.2)

## 2022-08-15 LAB — COMPREHENSIVE METABOLIC PANEL
ALT: 16 U/L (ref 0–44)
AST: 16 U/L (ref 15–41)
Albumin: 2.3 g/dL — ABNORMAL LOW (ref 3.5–5.0)
Alkaline Phosphatase: 98 U/L (ref 38–126)
Anion gap: 8 (ref 5–15)
BUN: 15 mg/dL (ref 6–20)
CO2: 22 mmol/L (ref 22–32)
Calcium: 9.4 mg/dL (ref 8.9–10.3)
Chloride: 98 mmol/L (ref 98–111)
Creatinine, Ser: 1.52 mg/dL — ABNORMAL HIGH (ref 0.61–1.24)
GFR, Estimated: 52 mL/min — ABNORMAL LOW (ref >60)
Glucose, Bld: 170 mg/dL — ABNORMAL HIGH (ref 70–99)
Potassium: 4.6 mmol/L (ref 3.5–5.1)
Sodium: 128 mmol/L — ABNORMAL LOW (ref 135–145)
Total Bilirubin: 1 mg/dL (ref 0.3–1.2)
Total Protein: 7.7 g/dL (ref 6.5–8.1)

## 2022-08-15 LAB — PHOSPHORUS: Phosphorus: 4.2 mg/dL (ref 2.5–4.6)

## 2022-08-15 LAB — MAGNESIUM: Magnesium: 1.9 mg/dL (ref 1.7–2.4)

## 2022-08-15 LAB — PROCALCITONIN: Procalcitonin: 0.1 ng/mL

## 2022-08-15 LAB — BRAIN NATRIURETIC PEPTIDE: B Natriuretic Peptide: 193.9 pg/mL — ABNORMAL HIGH (ref 0.0–100.0)

## 2022-08-15 MED ORDER — SODIUM CHLORIDE 0.9 % IV SOLN
10.0000 mg/kg | Freq: Every day | INTRAVENOUS | Status: DC
  Administered 2022-08-15 – 2022-08-17 (×3): 750 mg via INTRAVENOUS
  Filled 2022-08-15 (×4): qty 15

## 2022-08-15 MED ORDER — METHYLNALTREXONE BROMIDE 12 MG/0.6ML ~~LOC~~ SOLN
12.0000 mg | SUBCUTANEOUS | Status: DC
  Administered 2022-08-15 – 2022-08-19 (×3): 12 mg via SUBCUTANEOUS
  Filled 2022-08-15 (×3): qty 0.6

## 2022-08-15 MED ORDER — HYDROMORPHONE HCL 1 MG/ML IJ SOLN
1.0000 mg | INTRAMUSCULAR | Status: AC
  Administered 2022-08-15: 1 mg via INTRAVENOUS
  Filled 2022-08-15: qty 1

## 2022-08-15 MED ORDER — LACTATED RINGERS IV SOLN: INTRAVENOUS | Status: AC

## 2022-08-15 NOTE — Progress Notes (Signed)
PROGRESS NOTE                                                                                                                                                                                                             Patient Demographics:    David [name], is a 62 y.o. male, DOB - 15-Apr-1960, RUE:[id number]  Outpatient Primary MD for the patient is [name], MD    LOS - 34  Admit date - 07/12/2022    Chief Complaint  Patient presents with   Shortness of Breath       Brief Narrative (HPI from H&P)   PMH of chronic combined CHF, chronic A-fib on Eliquis, type II DM, prior amputation of right foot, HTN.  Present to the hospital with complaints of fever.  Found to have MRSA bacteremia as well as multiple abscesses.  Originally admitted at [facility] on 4/9.  Blood cultures came positive for MRSA, his further workup suggested that patient had bilateral pneumonia and right-sided parapneumonic effusion, T7-T9 discitis and phlegmon, also had right foot infection.  He was seen by pulmonary, IR, neurosurgery and ID along with orthopedic surgery.    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  He was transferred to my care on 08/11/2022 on day 30 of his hospital stay.   Significant procedures and events  4/10 ID was consulted. 4/12 TTE was negative, TEE was done which was negative for vegetation 4/18 nephrology consulted for AKI. 4/22 neurosurgery [facility]/ Dr Yetta Barre were consulted. Recommend medical management with IV antibiotics only without any intervention. Neurosurgery was consulted and recommend conservative measures. 4/23 Dr. [name] was consulted for amputation.  Vascular surgery was consulted for concerns for PVD. 4/24 IR placed right-sided chest tube for pleural effusion 4/25 underwent  right below-knee popliteal artery, tibioperoneal trunk and peroneal angioplasty with Dr. Chestine Spore. 4/26 IR remove the chest tube.  Follow-up chest x-ray still shows pleural effusions. 4/27 patient currently does not want to perform the procedure with Dr. [name].  Informed Dr. [name] as well as consulted Dr. Linna Caprice. 4/29: Patient agreeable to proceed with amputation surgery with orthopedic surgeon Dr. Odis Hollingshead 4/30: Patient underwent right fifth ray revision amputation. 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 2. Thin epidural phlegmon/abscess along the posterior T7 and T8 endplates. 3. New mild pathologic compression deformity of the T8 vertebral body with 3-4 mm retropulsion of the posterior endplate which combined with the phlegmon/absces results in mild spinal canal stenosis 08/11/2022.  Transferred to my service on day 30 of his hospital stay.   Subjective:   Patient in bed, appears comfortable, denies any headache, no fever, no chest pain or pressure, no shortness of breath , no abdominal pain. No focal weakness.  Although having bowel movements but still feels he is backed up quite a bit.   Assessment  & Plan :    Sepsis secondary to MRSA bacteremia.  Potential sources T7-T9 discitis and phlegmon, right foot infection and osteomyelitis, right-sided pneumonia and parapneumonic effusion.  He had sepsis upon admission which has resolved - Case discussed with ID Dr. Algis Liming on 08/12/2022.  T7 T9 discitis.  He was seen by neurosurgery Dr. Yetta Barre who recommended medical management for his discitis and phlegmon, ID is managing IV antibiotics currently on combination of daptomycin and doxycycline.  Right foot infection and osteomyelitis.  Patient was seen by orthopedic surgery, underwent right fifth ray amputation on 08/02/2022 by Dr. Odis Hollingshead, continue antibiotics along with supportive  care.  History of bilateral pneumonia with right-sided parapneumonic effusion.  Seen by pulmonary critical care.  Had chest tube placed by IR on 07/26/2024 and chest tube was removed on 07/28/2024.  Clinically stable from the standpoint.   Acute on chronic pain.  Currently on combination of  IV Dilaudid as well as oxycodone and scheduled Robaxin, lyrica, and OxyContin every 12, he is on multiple high doses of narcotics and doses, being managed by palliative care.  Discussed his pain control with nurse Francena Hanly in room on 08/14/2022.  He confirms that is wishing to gradually come off of narcotics for good and that he prefers them being tapered down gradually.  Constipation with narcotic bowel.  Placed on Movantik and bowel regimen.  Has received 2 doses of enema, still constipated add Relistor.  Flat affect with signs of depression.  Not suicidal or homicidal, according to nursing staff and in my interview patient changes his subjective complaints and treatment desires from hour to hour, seen and cleared by Psych.  Anemia of chronic disease.  Stable.  Paroxysmal atrial fibrillation.  [place] vas 2 score of greater than 3.  Currently on digoxin, is beta-blocker along with Eliquis.  Hypertension.  Poorly controlled blood pressure adjusted beta-blocker and added Norvasc for better control.  Dyslipidemia.  On statin.    Acute on chronic combined CHF EF 60%.  Now resolved and compensated, on Coreg continue.  Currently not on diuretics.  Hyponatremia with AKI.  Dehydrated improved after IV fluids.  Peripheral vascular disease with critical limb ischemia.  Vascular surgery was consulted by Dr. [name] Underwent angiogram 4/25 with angioplasty of right below-knee popliteal artery, tibioperoneal trunk, and peroneal artery, Recommendation for aspirin, Plavix (>>Eliquis) and statin DW Dr. Chestine Spore 08/10/24.  DM type II.  On Lantus, Premeal NovoLog and sliding scale.  Monitor and adjust.  Lab Results  Component Value  Date   HGBA1C 9.1 (H) 07/13/2022   CBG (last 3)  Recent Labs    08/14/22 1613 08/14/22 2210 08/15/22 0731  GLUCAP 86 140* 150*          Condition - Extremely Guarded  Family Communication  : Called the listed phone number for patient's friend  [phone]  on 08/12/2022, phone number is disconnected.  Code Status :  Full  Consults  :  ID, orthopedics, neurosurgery, psychiatry, palliative care  PUD Prophylaxis :    Procedures  :     MRI - 1. 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 2. Thin epidural phlegmon/abscess along the posterior T7 and T8 endplates. 3. New mild pathologic compression deformity of the T8 vertebral body with 3-4 mm retropulsion of the posterior endplate which combined with the phlegmon/absces results in mild spinal canal stenosis. 4. Interval decrease in size of the lobulated fluid collection in the paraspinal soft tissues at T7-T9. 5. Complex right pleural effusion, incompletely imaged but at least somewhat decreased in size compared to the prior MRI.       Disposition Plan  :    Status is: Inpatient   DVT Prophylaxis  :     apixaban (ELIQUIS) tablet 5 mg     Lab Results  Component Value Date   PLT 397 08/15/2022    Diet :  Diet Order             Diet Carb Modified Fluid consistency: Thin; Room service appropriate? Yes; Fluid restriction: 1500 mL Fluid  Diet effective now                    Inpatient Medications  Scheduled Meds:  acetaminophen  650 mg Oral Q6H   amLODipine  10 mg Oral Daily   apixaban  5 mg Oral BID   aspirin EC  81 mg Oral Daily   atorvastatin  80 mg Oral Daily   busPIRone  15 mg Oral TID   carvedilol  6.25 mg Oral BID   Chlorhexidine Gluconate Cloth  6 each Topical Daily   digoxin  125 mcg Oral Daily   docusate sodium  200 mg Oral BID   doxycycline  100 mg Oral Q12H   famotidine  20  mg Oral BID   feeding supplement (GLUCERNA SHAKE)  237 mL Oral TID BM   ferrous sulfate  325 mg Oral BID WC   folic acid  1 mg Oral Daily   insulin aspart  0-20 Units Subcutaneous TID WC   insulin aspart  3 Units Subcutaneous TID WC   insulin glargine-yfgn  35 Units Subcutaneous Daily   lactulose  30 g Oral TID   lidocaine  1 patch Transdermal Daily   methocarbamol  500 mg Oral TID   methylnaltrexone  12 mg Subcutaneous QODAY   naloxegol oxalate  25 mg Oral Daily   oxyCODONE  20 mg Oral Q12H   polyethylene glycol  17 g Oral BID   pregabalin  50 mg Oral TID   senna-docusate  2 tablet Oral BID   Continuous Infusions:  DAPTOmycin (CUBICIN) 800 mg in sodium chloride 0.9 % IVPB 800 mg (08/14/22 2042)   lactated ringers 100 mL/hr at 08/15/22 0715   PRN Meds:.Glycerin (Adult), HYDROmorphone (DILAUDID) injection, hydrOXYzine, ipratropium-albuterol, labetalol, melatonin, Muscle Rub, naLOXone (NARCAN)  injection, ondansetron **OR** [DISCONTINUED] ondansetron (ZOFRAN) IV, mouth rinse, oxyCODONE, oxyCODONE, sodium chloride flush    Objective:   Vitals:   08/14/22 2214 08/14/22 2310 08/15/22 0020 08/15/22 0346  BP: (!) 135/120 (!) 148/88 125/86   Pulse:  80 75   Resp: 18  16   Temp: 98 F (36.7 C)  98.6 F (37 C)   TempSrc: Oral  Oral   SpO2:  98%   Weight:    75.2 kg  Height:        Wt Readings from Last 3 Encounters:  08/15/22 75.2 kg  07/09/22 81.6 kg  07/01/22 83.5 kg     Intake/Output Summary (Last 24 hours) at 08/15/2022 0942 Last data filed at 08/15/2022 0717 Gross per 24 hour  Intake --  Output 500 ml  Net -500 ml     Physical Exam  Awake Alert, No new F.N deficits, Normal affect [place].AT,PERRAL Supple Neck, No JVD,   Symmetrical Chest wall movement, Good air movement bilaterally, CTAB RRR,No Gallops,Rubs or new Murmurs,  +ve B.Sounds, Abd Soft, No tenderness,   No Cyanosis, Clubbing or edema        Data Review:    Recent Labs  Lab 08/11/22 0627  08/12/22 0530 08/13/22 0253 08/14/22 0341 08/15/22 0400  WBC 12.0* 11.0* 10.4 9.2 11.3*  HGB 8.9* 9.2* 10.0* 9.6* 10.2*  HCT 26.3* 26.4* 30.0* 29.4* 30.9*  PLT 357 352 379 390 397  MCV 86.2 86.8 88.0 87.2 89.6  MCH 29.2 30.3 29.3 28.5 29.6  MCHC 33.8 34.8 33.3 32.7 33.0  RDW 14.6 14.6 14.8 14.6 14.6  LYMPHSABS 1.1 1.2 0.8 1.4 1.3  MONOABS 1.2* 1.2* 1.0 1.2* 1.4*  EOSABS 0.2 0.1 0.2 0.2 0.2  BASOSABS 0.1 0.1 0.1 0.1 0.1    Recent Labs  Lab 08/11/22 0627 08/12/22 0530 08/13/22 0253 08/14/22 0341 08/15/22 0400  NA 126* 125* 133* 130* 128*  K 4.8 4.0 3.9 3.8 4.6  CL 94* 96* 99 101 98  CO2 23 21* 24 23 22   ANIONGAP 9 8 10 6 8   GLUCOSE 201* 245* 113* 100* 170*  BUN 28* 24* 14 12 15   CREATININE 1.81* 1.65* 1.31* 1.32* 1.52*  AST 14* 15 18 19 16   ALT 11 12 12 15 16   ALKPHOS 91 93 102 93 98  BILITOT 0.8 0.8 0.6 0.8 1.0  ALBUMIN 2.0* 2.0* 2.2* 2.1* 2.3*  CRP 9.7* 10.8* 9.3* 8.0* 8.8*  PROCALCITON 0.12 0.17 0.14 0.10 0.10  BNP  --  452.7* 477.1* 335.1* 193.9*  MG 1.8 1.9 1.9 1.9 1.9  CALCIUM 9.1 9.0 9.8 9.4 9.4   Lab Results  Component Value Date   HGBA1C 9.1 (H) 07/13/2022     Radiology Reports DG Abd Portable 1V  Result Date: 08/13/2022 CLINICAL DATA:  Shortness of breath and constipation. EXAM: PORTABLE ABDOMEN - 1 VIEW COMPARISON:  CT 07/12/2022 FINDINGS: The bowel gas pattern appears nonobstructed. Gas and stool is noted throughout the colon and rectum. No pathologic dilatation of the large or small bowel loops noted. IMPRESSION: Nonobstructive bowel gas pattern. Electronically Signed   By: Signa Kell M.D.   On: 08/13/2022 08:27   US RENAL  Result Date: 08/12/2022 CLINICAL DATA:  Acute kidney injury EXAM: RENAL / URINARY TRACT ULTRASOUND COMPLETE COMPARISON:  CT abdomen pelvis with contrast 07/12/2022 FINDINGS: Right Kidney: Renal measurements: 11.2 x 6.0 x 6.2 cm = volume: 179 mL. Echogenicity within normal limits. No mass or hydronephrosis visualized. Left  Kidney: Renal measurements: 13.0 x 6.1 x 5.1 cm = volume: 209 mL. Echogenicity within normal limits. No mass or hydronephrosis visualized. Bladder: Appears normal for degree of bladder distention. Other: None. IMPRESSION: No significant sonographic abnormality of the kidneys. Electronically Signed   By: Acquanetta Belling M.D.   On: 08/12/2022 15:00      Signature  -   Susa Raring M.D on 08/15/2022 at 9:42 AM   -  To  page go to [email or web address]

## 2022-08-15 NOTE — Progress Notes (Signed)
Patient ID: [name], male   DOB: Aug 16, 1960, 62 y.o.   MRN: [id number]    Progress Note from the Palliative Medicine Team at [facility]   Patient Name: David [name]        Date: 08/15/2022 DOB: [birth year]  Age: 62 y.o. MRN#: [id number] Attending Physician: Leroy Sea, MD Primary Care Physician: [name], MD Admit Date: 07/12/2022   Medical records reviewed   David [name] is a 62 y.o. 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     Admitted  to the hospital with complaints of fever on 07/12/22.    Found to have MRSA bacteremia as well as multiple abscesses.    Originally admitted at [facility]  though blood cultures came positive for MRSA, his further workup suggested that patient had bilateral pneumonia and right-sided parapneumonic effusion, T7-T9 discitis and phlegmon, also had right foot infection.    He was seen by pulmonary, IR, neurosurgery and ID along with orthopedic surgery.  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   Complex pain in the setting of acute on chronic disease burden   Initial Palliative care was 08-13-22   I met with David [name] at the bedside and introduced myself as a provider with the palliative medicine team.  David [name] shared with me his experiences  within the healthcare system over the past year.     He reports that his pain is well-controlled on the current medication regime started by the previous PMT provider.  Ultimately his hope is continue rehabilitation in CIR by the end of the week.  Education offered on the importance of converting him to a full oral pain medication regiment in order to secure safe viable transitions of care.  Education offered on the importance of securing outpatient primary care and  outpatient  pain management specialist prior to any discharge.  I shared my concern that sometimes it can be difficult securing these specialists.    We spoke about the strict pain management regulations and the need for contracts with pain management specialist.  Symptom Management:  Acute on Chronic Pain in the setting of Discitis: - Tylenol 650mg  PO Q6H ATC  - Dilaudid 0.5mg  IVP Q6H PRN - Robaxin 500mg  TID - Oxycodone 5-10mg  PO Q4H PRN - Oxycontin 20mg  PO Q12H  - Lyrica 50mg  TID - Muscle Rub as needed - Lidoderm patch -K Pad    Opoid induced constipation: - Senna 2 Tabs PO BID - MOM twice daily - Lactulose 30ml TID - Miralax BID - Relistor ( added today by attending)    Anxiety: - Buspar 15mg  PO TID - Atarax 25mg  PO TID PRN - Seen by psychiatry 5/11         Later in the day patient called the team phone requesting a revisit secondary to pain.  I arrived to the room patient was out of bed in the chair.  He had concerns regarding getting his medications in a timely manner.   On further exploration David [name] tells me he did not know that a as needed medication was upon request.  He rates his pain at a 10+   I ordered a one-time dose of IV Dilaudid 1 mg now-this will give [place] the opportunity of getting him back on track with his oral agents.  Education offered today regarding  the importance of continued conversation with family and the  medical providers regarding overall plan of care and treatment options,  ensuring decisions are within the context of the patients values and GOCs.  I will follow-up in the morning  Questions and concerns addressed   Discussed with Dr  Thedore Mins and bedside RN    Time:  75 minutes  Detailed review of medical records ( labs, imaging, vital signs), medically appropriate exam ( MS, skin, resp)   discussed with treatment team, counseling and education to patient, family, staff, documenting clinical information, medication management,  coordination of care    Lorinda Creed NP  Palliative Medicine Team Team Phone # [phone] Pager [phone]

## 2022-08-15 NOTE — Progress Notes (Signed)
PT Cancellation Note  Patient Details Name: David [name] MRN: [id number] DOB: 04/07/1960   Cancelled Treatment:    Reason Eval/Treat Not Completed: (P) Patient declined, no reason specified, pt declining mobility x3 throughout day, complaining of pain despite pre-medication each attempt, pt tearful and asking therapy to return tomorrow. Will check back tomorrow to continue with POC.   Lenora Boys. PTA Acute Rehabilitation Services Office: [phone]    Catalina Antigua 08/15/2022, 3:44 PM

## 2022-08-15 NOTE — Progress Notes (Signed)
Inpatient Rehab Admissions Coordinator:    CIR is following for potential admit. Pt.'s sister  Joni Reining called and stated that Pt. Cannot return to where he was living before. I asked the patient what she meant by this. He states that he is living in an extended stay hotel with his wife and that while he is in the process of being evicted, he is in the process of litigation over it and has a Clinical research associate working to prevent eviction. States the litigation process will take several months and that he cannot be evicted until it is completed. I asked Pt. To have his wife call me, to find out the date for when his case would be heard in court, and provide me the number and name of his lawyer to confirm that pt. In fact will have a place to return to once he has completed his course on CIR.. If he does not have a place to return after CIR, I am not able to offer him a bed.   Megan Salon, MS, CCC-SLP Rehab Admissions Coordinator  [phone] (celll) [phone] (office)

## 2022-08-15 NOTE — Plan of Care (Signed)
  Problem: Education: Goal: Knowledge of disease or condition will improve Outcome: Progressing Goal: Knowledge of the prescribed therapeutic regimen will improve Outcome: Progressing Goal: Individualized Educational Video(s) Outcome: Progressing   Problem: Activity: Goal: Ability to tolerate increased activity will improve Outcome: Progressing Goal: Will verbalize the importance of balancing activity with adequate rest periods Outcome: Progressing   Problem: Respiratory: Goal: Ability to maintain a clear airway will improve Outcome: Progressing Goal: Levels of oxygenation will improve Outcome: Progressing Goal: Ability to maintain adequate ventilation will improve Outcome: Progressing   Problem: Activity: Goal: Ability to tolerate increased activity will improve Outcome: Progressing   Problem: Clinical Measurements: Goal: Ability to maintain a body temperature in the normal range will improve Outcome: Progressing   Problem: Respiratory: Goal: Ability to maintain adequate ventilation will improve Outcome: Progressing Goal: Ability to maintain a clear airway will improve Outcome: Progressing   Problem: Education: Goal: Ability to describe self-care measures that may prevent or decrease complications (Diabetes Survival Skills Education) will improve Outcome: Progressing   Problem: Coping: Goal: Ability to adjust to condition or change in health will improve Outcome: Progressing   Problem: Fluid Volume: Goal: Ability to maintain a balanced intake and output will improve Outcome: Progressing   Problem: Health Behavior/Discharge Planning: Goal: Ability to identify and utilize available resources and services will improve Outcome: Progressing Goal: Ability to manage health-related needs will improve Outcome: Progressing   Problem: Metabolic: Goal: Ability to maintain appropriate glucose levels will improve Outcome: Progressing   Problem: Nutritional: Goal:  Maintenance of adequate nutrition will improve Outcome: Progressing Goal: Progress toward achieving an optimal weight will improve Outcome: Progressing   Problem: Skin Integrity: Goal: Risk for impaired skin integrity will decrease Outcome: Progressing   Problem: Tissue Perfusion: Goal: Adequacy of tissue perfusion will improve Outcome: Progressing   Problem: Education: Goal: Knowledge of General Education information will improve Description: Including pain rating scale, medication(s)/side effects and non-pharmacologic comfort measures Outcome: Progressing   Problem: Health Behavior/Discharge Planning: Goal: Ability to manage health-related needs will improve Outcome: Progressing   Problem: Clinical Measurements: Goal: Ability to maintain clinical measurements within normal limits will improve Outcome: Progressing Goal: Will remain free from infection Outcome: Progressing Goal: Diagnostic test results will improve Outcome: Progressing Goal: Respiratory complications will improve Outcome: Progressing Goal: Cardiovascular complication will be avoided Outcome: Progressing   Problem: Activity: Goal: Risk for activity intolerance will decrease Outcome: Progressing   Problem: Nutrition: Goal: Adequate nutrition will be maintained Outcome: Progressing   Problem: Coping: Goal: Level of anxiety will decrease Outcome: Progressing   Problem: Elimination: Goal: Will not experience complications related to bowel motility Outcome: Progressing Goal: Will not experience complications related to urinary retention Outcome: Progressing   Problem: Pain Managment: Goal: General experience of comfort will improve Outcome: Progressing   Problem: Safety: Goal: Ability to remain free from injury will improve Outcome: Progressing   Problem: Skin Integrity: Goal: Risk for impaired skin integrity will decrease Outcome: Progressing

## 2022-08-15 NOTE — Progress Notes (Signed)
This chaplain responded to PMT NP-Michelle consult for creating/updating the Pt. Advance Directive and prayer.    The Pt. expresses an interest in completing an AD, however requests a pause until pain can be better managed. The chaplain updated the PMT on the Pt. pain with the understanding the PMT created the pain management plan. The chaplain left the incomplete document on the Pt. bedside table for Pt. review and a revisit from the chaplain.   This chaplain is available for F/U spiritual care as needed.  Chaplain Stephanie Acre [phone]

## 2022-08-16 ENCOUNTER — Inpatient Hospital Stay (HOSPITAL_COMMUNITY): Payer: Medicaid Other

## 2022-08-16 DIAGNOSIS — A429 Actinomycosis, unspecified: Secondary | ICD-10-CM

## 2022-08-16 DIAGNOSIS — G8929 Other chronic pain: Secondary | ICD-10-CM

## 2022-08-16 DIAGNOSIS — M60009 Infective myositis, unspecified site: Secondary | ICD-10-CM

## 2022-08-16 LAB — GLUCOSE, CAPILLARY
Glucose-Capillary: 134 mg/dL — ABNORMAL HIGH (ref 70–99)
Glucose-Capillary: 130 mg/dL — ABNORMAL HIGH (ref 70–99)
Glucose-Capillary: 111 mg/dL — ABNORMAL HIGH (ref 70–99)
Glucose-Capillary: 194 mg/dL — ABNORMAL HIGH (ref 70–99)

## 2022-08-16 LAB — CBC WITH DIFFERENTIAL/PLATELET
Abs Immature Granulocytes: 0.03 10*3/uL (ref 0.00–0.07)
Basophils Absolute: 0.1 10*3/uL (ref 0.0–0.1)
Basophils Relative: 1 %
Eosinophils Absolute: 0.3 10*3/uL (ref 0.0–0.5)
Eosinophils Relative: 3 %
HCT: 28.6 % — ABNORMAL LOW (ref 39.0–52.0)
Hemoglobin: 9.3 g/dL — ABNORMAL LOW (ref 13.0–17.0)
Immature Granulocytes: 0 %
Lymphocytes Relative: 13 %
Lymphs Abs: 1.1 10*3/uL (ref 0.7–4.0)
MCH: 28.7 pg (ref 26.0–34.0)
MCHC: 32.5 g/dL (ref 30.0–36.0)
MCV: 88.3 fL (ref 80.0–100.0)
Monocytes Absolute: 1.1 10*3/uL — ABNORMAL HIGH (ref 0.1–1.0)
Monocytes Relative: 13 %
Neutro Abs: 5.9 10*3/uL (ref 1.7–7.7)
Neutrophils Relative %: 70 %
Platelets: 330 10*3/uL (ref 150–400)
RBC: 3.24 MIL/uL — ABNORMAL LOW (ref 4.22–5.81)
RDW: 14.6 % (ref 11.5–15.5)
WBC: 8.5 10*3/uL (ref 4.0–10.5)
nRBC: 0 % (ref 0.0–0.2)

## 2022-08-16 LAB — PHOSPHORUS: Phosphorus: 3.7 mg/dL (ref 2.5–4.6)

## 2022-08-16 LAB — PROCALCITONIN: Procalcitonin: <0.1 ng/mL

## 2022-08-16 LAB — MAGNESIUM: Magnesium: 1.8 mg/dL (ref 1.7–2.4)

## 2022-08-16 NOTE — Progress Notes (Signed)
Subjective:  Back pain is better  Antibiotics:  Anti-infectives (From admission, onward)    Start     Dose/Rate Route Frequency Ordered Stop   08/15/22 2000  DAPTOmycin (CUBICIN) 750 mg in sodium chloride 0.9 % IVPB        10 mg/kg  75.2 kg 130 mL/hr over 30 Minutes Intravenous Daily 08/15/22 1347     08/11/22 2200  vancomycin (VANCOREADY) IVPB 750 mg/150 mL  Status:  Discontinued        750 mg 150 mL/hr over 60 Minutes Intravenous Every 24 hours 08/10/22 2218 08/11/22 1327   08/11/22 2200  doxycycline (VIBRA-TABS) tablet 100 mg        100 mg Oral Every 12 hours 08/11/22 1327     08/11/22 2000  DAPTOmycin (CUBICIN) 800 mg in sodium chloride 0.9 % IVPB  Status:  Discontinued        10 mg/kg  81.6 kg 132 mL/hr over 30 Minutes Intravenous Daily 08/11/22 1327 08/15/22 1347   08/04/22 1000  vancomycin (VANCOREADY) IVPB 1250 mg/250 mL  Status:  Discontinued        1,250 mg 166.7 mL/hr over 90 Minutes Intravenous Every 24 hours 08/03/22 1150 08/03/22 2212   08/04/22 1000  vancomycin (VANCOCIN) IVPB 1000 mg/200 mL premix  Status:  Discontinued        1,000 mg 200 mL/hr over 60 Minutes Intravenous Every 12 hours 08/04/22 0026 08/10/22 1339   08/03/22 2300  vancomycin (VANCOCIN) IVPB 1000 mg/200 mL premix        1,000 mg 200 mL/hr over 60 Minutes Intravenous  Once 08/03/22 2212 08/04/22 1038   08/03/22 0400  ceFAZolin (ANCEF) IVPB 2g/100 mL premix  Status:  Discontinued        2 g 200 mL/hr over 30 Minutes Intravenous Every 8 hours 08/02/22 2038 08/08/22 1302   08/02/22 2000  ceFAZolin (ANCEF) IVPB 1 g/50 mL premix  Status:  Discontinued        1 g 100 mL/hr over 30 Minutes Intravenous Every 8 hours 08/02/22 1847 08/02/22 2038   08/02/22 1226  vancomycin (VANCOCIN) powder  Status:  Discontinued          As needed 08/02/22 1250 08/02/22 1251   07/26/22 2000  vancomycin (VANCOCIN) IVPB 1000 mg/200 mL premix  Status:  Discontinued        1,000 mg 200 mL/hr over 60 Minutes  Intravenous Every 12 hours 07/26/22 1054 08/03/22 1150   07/24/22 1845  DAPTOmycin (CUBICIN) 650 mg in sodium chloride 0.9 % IVPB  Status:  Discontinued        8 mg/kg  83.9 kg 126 mL/hr over 30 Minutes Intravenous Daily 07/24/22 1759 07/26/22 1050   07/18/22 1500  Oritavancin Diphosphate (ORBACTIV) 1,200 mg in dextrose 5 % IVPB        1,200 mg 333.3 mL/hr over 180 Minutes Intravenous Once 07/18/22 1351 07/18/22 1822   07/16/22 0000  daptomycin (CUBICIN) IVPB  Status:  Discontinued        650 mg Intravenous Every 24 hours 07/16/22 1116 07/19/22    07/15/22 2000  DAPTOmycin (CUBICIN) 650 mg in sodium chloride 0.9 % IVPB  Status:  Discontinued        8 mg/kg  81 kg 126 mL/hr over 30 Minutes Intravenous Daily 07/15/22 1411 07/15/22 1413   07/15/22 2000  DAPTOmycin (CUBICIN) 650 mg in sodium chloride 0.9 % IVPB  Status:  Discontinued        8  mg/kg  81 kg 126 mL/hr over 30 Minutes Intravenous Daily 07/15/22 1413 07/18/22 1351   07/14/22 0200  vancomycin (VANCOREADY) IVPB 1750 mg/350 mL  Status:  Discontinued        1,750 mg 175 mL/hr over 120 Minutes Intravenous Every 24 hours 07/13/22 0718 07/15/22 1411   07/13/22 1000  cefTRIAXone (ROCEPHIN) 1 g in sodium chloride 0.9 % 100 mL IVPB  Status:  Discontinued        1 g 200 mL/hr over 30 Minutes Intravenous Every 24 hours 07/12/22 1405 07/13/22 0239   07/13/22 1000  azithromycin (ZITHROMAX) 500 mg in sodium chloride 0.9 % 250 mL IVPB  Status:  Discontinued        500 mg 250 mL/hr over 60 Minutes Intravenous Every 24 hours 07/12/22 1405 07/13/22 0239   07/13/22 0330  vancomycin (VANCOREADY) IVPB 1750 mg/350 mL        1,750 mg 175 mL/hr over 120 Minutes Intravenous  Once 07/13/22 0240 07/13/22 0517   07/12/22 0845  cefTRIAXone (ROCEPHIN) 1 g in sodium chloride 0.9 % 100 mL IVPB        1 g 200 mL/hr over 30 Minutes Intravenous  Once 07/12/22 0844 07/12/22 1218   07/12/22 0845  azithromycin (ZITHROMAX) 500 mg in sodium chloride 0.9 % 250 mL  IVPB        500 mg 250 mL/hr over 60 Minutes Intravenous  Once 07/12/22 0844 07/12/22 1218       Medications: Scheduled Meds:  acetaminophen  650 mg Oral Q6H   apixaban  5 mg Oral BID   aspirin EC  81 mg Oral Daily   atorvastatin  80 mg Oral Daily   busPIRone  15 mg Oral TID   carvedilol  6.25 mg Oral BID   Chlorhexidine Gluconate Cloth  6 each Topical Daily   digoxin  125 mcg Oral Daily   docusate sodium  200 mg Oral BID   doxycycline  100 mg Oral Q12H   famotidine  20 mg Oral BID   feeding supplement (GLUCERNA SHAKE)  237 mL Oral TID BM   ferrous sulfate  325 mg Oral BID WC   folic acid  1 mg Oral Daily   insulin aspart  0-20 Units Subcutaneous TID WC   insulin glargine-yfgn  35 Units Subcutaneous Daily   lactulose  30 g Oral TID   lidocaine  1 patch Transdermal Daily   methocarbamol  500 mg Oral TID   methylnaltrexone  12 mg Subcutaneous QODAY   naloxegol oxalate  25 mg Oral Daily   oxyCODONE  20 mg Oral Q12H   polyethylene glycol  17 g Oral BID   pregabalin  50 mg Oral TID   senna-docusate  2 tablet Oral BID   Continuous Infusions:  DAPTOmycin (CUBICIN) 750 mg in sodium chloride 0.9 % IVPB 130 mL/hr at 08/16/22 1425   PRN Meds:.Glycerin (Adult), HYDROmorphone (DILAUDID) injection, hydrOXYzine, ipratropium-albuterol, labetalol, melatonin, Muscle Rub, naLOXone (NARCAN)  injection, ondansetron **OR** [DISCONTINUED] ondansetron (ZOFRAN) IV, mouth rinse, oxyCODONE, oxyCODONE, sodium chloride flush    Objective: Weight change:   Intake/Output Summary (Last 24 hours) at 08/16/2022 1456 Last data filed at 08/16/2022 1425 Gross per 24 hour  Intake 815 ml  Output 400 ml  Net 415 ml    Blood pressure 132/85, pulse 72, temperature 98 F (36.7 C), temperature source Oral, resp. rate 16, height 6' (1.829 m), weight 75.2 kg, SpO2 96 %. Temp:  [98 F (36.7 C)-98.5 F (36.9 C)] 98 F (  36.7 C) (05/14 1137) Pulse Rate:  [72-81] 72 (05/14 0740) Resp:  [16-17] 16 (05/14  0740) BP: (110-133)/(66-85) 132/85 (05/14 1137) SpO2:  [95 %-98 %] 96 % (05/14 1137)  Physical Exam: Physical Exam Constitutional:      Appearance: He is well-developed.  HENT:     Head: Normocephalic and atraumatic.  Eyes:     Conjunctiva/sclera: Conjunctivae normal.  Pulmonary:     Effort: Pulmonary effort is normal. No respiratory distress.     Breath sounds: No wheezing.  Abdominal:     Palpations: Abdomen is soft.  Musculoskeletal:        General: Normal range of motion.     Cervical back: Normal range of motion and neck supple.  Skin:    General: Skin is warm and dry.     Findings: No rash.  Neurological:     General: No focal deficit present.     Mental Status: He is alert and oriented to person, place, and time.  Psychiatric:        Mood and Affect: Mood normal.        Behavior: Behavior normal.        Thought Content: Thought content normal.        Judgment: Judgment normal.      Foot bandaged  CBC:    BMET Recent Labs    08/14/22 0341 08/15/22 0400  NA 130* 128*  K 3.8 4.6  CL 101 98  CO2 23 22  GLUCOSE 100* 170*  BUN 12 15  CREATININE 1.32* 1.52*  CALCIUM 9.4 9.4      Liver Panel  Recent Labs    08/14/22 0341 08/15/22 0400  PROT 7.1 7.7  ALBUMIN 2.1* 2.3*  AST 19 16  ALT 15 16  ALKPHOS 93 98  BILITOT 0.8 1.0        Sedimentation Rate No results for input(s): "ESRSEDRATE" in the last 72 hours. C-Reactive Protein Recent Labs    08/14/22 0341 08/15/22 0400  CRP 8.0* 8.8*    Micro Results: Recent Results (from the past 720 hour(s))  Culture, blood (Routine X 2) w Reflex to ID Panel     Status: Abnormal   Collection Time: 07/24/22  3:55 PM   Specimen: BLOOD LEFT ARM  Result Value Ref Range Status   Specimen Description   Final    BLOOD LEFT ARM Performed at [facility], [address], [place], [place] [address]    Special Requests   Final    AEROBIC BOTTLE ONLY Blood Culture adequate volume Performed at  [facility], [address], [place], [place] [address]    Culture  Setup Time   Final    GRAM POSITIVE COCCI IN CLUSTERS AEROBIC BOTTLE ONLY CRITICAL RESULT CALLED TO, READ BACK BY AND VERIFIED WITH: PHARMD T. JOHNSON 07/26/22 @ 0016 BY AB Performed at [facility], [address], [place], [place] [address]    Culture METHICILLIN RESISTANT STAPHYLOCOCCUS AUREUS (A)  Final   Report Status 07/27/2022 FINAL  Final   Organism ID, Bacteria METHICILLIN RESISTANT STAPHYLOCOCCUS AUREUS  Final      Susceptibility   Methicillin resistant staphylococcus aureus - MIC*    CIPROFLOXACIN >=8 RESISTANT Resistant     ERYTHROMYCIN >=8 RESISTANT Resistant     GENTAMICIN <=0.5 SENSITIVE Sensitive     OXACILLIN >=4 RESISTANT Resistant     TETRACYCLINE <=1 SENSITIVE Sensitive     VANCOMYCIN <=0.5 SENSITIVE Sensitive     TRIMETH/SULFA >=  320 RESISTANT Resistant     CLINDAMYCIN <=0.25 SENSITIVE Sensitive     RIFAMPIN <=0.5 SENSITIVE Sensitive     Inducible Clindamycin NEGATIVE Sensitive     * METHICILLIN RESISTANT STAPHYLOCOCCUS AUREUS  Culture, blood (Routine X 2) w Reflex to ID Panel     Status: Abnormal   Collection Time: 07/24/22  3:55 PM   Specimen: BLOOD  Result Value Ref Range Status   Specimen Description   Final    BLOOD BLOOD RIGHT ARM Performed at [facility], [address] [place], [place] [address]    Special Requests   Final    AEROBIC BOTTLE ONLY Blood Culture adequate volume Performed at [facility], [address] [place], [place] [address]    Culture  Setup Time   Final    GRAM POSITIVE COCCI IN CLUSTERS AEROBIC BOTTLE ONLY CRITICAL VALUE NOTED.  VALUE IS CONSISTENT WITH PREVIOUSLY REPORTED AND CALLED VALUE. CRITICAL RESULT CALLED TO, READ BACK BY AND VERIFIED WITH: PHARMD M.BELL AT 1315 ON 07/25/2022 BY T.SAAD.    Culture (A)  Final    STAPHYLOCOCCUS AUREUS SUSCEPTIBILITIES PERFORMED ON PREVIOUS CULTURE  WITHIN THE LAST 5 DAYS. Performed at [facility], [address], [place], [place] [address]    Report Status 07/27/2022 FINAL  Final  Blood Culture ID Panel (Reflexed)     Status: Abnormal   Collection Time: 07/24/22  3:55 PM  Result Value Ref Range Status   Enterococcus faecalis NOT DETECTED NOT DETECTED Final   Enterococcus Faecium NOT DETECTED NOT DETECTED Final   Listeria monocytogenes NOT DETECTED NOT DETECTED Final   Staphylococcus species DETECTED (A) NOT DETECTED Final    Comment: CRITICAL RESULT CALLED TO, READ BACK BY AND VERIFIED WITH: PHARMD T. JOHNSON 07/26/22 @ 0016 BY AB    Staphylococcus aureus (BCID) DETECTED (A) NOT DETECTED Final    Comment: Methicillin (oxacillin)-resistant Staphylococcus aureus (MRSA). MRSA is predictably resistant to beta-lactam antibiotics (except ceftaroline). Preferred therapy is vancomycin unless clinically contraindicated. Patient requires contact precautions if  hospitalized. CRITICAL RESULT CALLED TO, READ BACK BY AND VERIFIED WITH: PHARMD T. JOHNSON 07/26/22 @ 0016 BY AB    Staphylococcus epidermidis NOT DETECTED NOT DETECTED Final   Staphylococcus lugdunensis NOT DETECTED NOT DETECTED Final   Streptococcus species NOT DETECTED NOT DETECTED Final   Streptococcus agalactiae NOT DETECTED NOT DETECTED Final   Streptococcus pneumoniae NOT DETECTED NOT DETECTED Final   Streptococcus pyogenes NOT DETECTED NOT DETECTED Final   A.calcoaceticus-baumannii NOT DETECTED NOT DETECTED Final   Bacteroides fragilis NOT DETECTED NOT DETECTED Final   Enterobacterales NOT DETECTED NOT DETECTED Final   Enterobacter cloacae complex NOT DETECTED NOT DETECTED Final   Escherichia coli NOT DETECTED NOT DETECTED Final   Klebsiella aerogenes NOT DETECTED NOT DETECTED Final   Klebsiella oxytoca NOT DETECTED NOT DETECTED Final   Klebsiella pneumoniae NOT DETECTED NOT DETECTED Final   Proteus species NOT DETECTED NOT DETECTED Final   Salmonella species NOT  DETECTED NOT DETECTED Final   Serratia marcescens NOT DETECTED NOT DETECTED Final   Haemophilus influenzae NOT DETECTED NOT DETECTED Final   Neisseria meningitidis NOT DETECTED NOT DETECTED Final   Pseudomonas aeruginosa NOT DETECTED NOT DETECTED Final   Stenotrophomonas maltophilia NOT DETECTED NOT DETECTED Final   Candida albicans NOT DETECTED NOT DETECTED Final   Candida auris NOT DETECTED NOT DETECTED Final   Candida glabrata NOT DETECTED NOT DETECTED Final   Candida krusei NOT DETECTED NOT DETECTED Final   Candida  parapsilosis NOT DETECTED NOT DETECTED Final   Candida tropicalis NOT DETECTED NOT DETECTED Final   Cryptococcus neoformans/gattii NOT DETECTED NOT DETECTED Final   Meth resistant mecA/C and MREJ DETECTED (A) NOT DETECTED Final    Comment: CRITICAL RESULT CALLED TO, READ BACK BY AND VERIFIED WITH: PHARMD T. JOHNSON 07/26/22 @ 0016 BY AB Performed at [facility], [address] [place], [place] [address]   Culture, blood (Routine X 2) w Reflex to ID Panel     Status: None   Collection Time: 07/27/22 12:15 PM   Specimen: BLOOD  Result Value Ref Range Status   Specimen Description BLOOD RIGHT ANTECUBITAL  Final   Special Requests   Final    BOTTLES DRAWN AEROBIC AND ANAEROBIC Blood Culture adequate volume   Culture   Final    NO GROWTH 5 DAYS Performed at [facility], [address], [place], [place] [address]    Report Status 08/01/2022 FINAL  Final  Culture, blood (Routine X 2) w Reflex to ID Panel     Status: None   Collection Time: 07/27/22 12:15 PM   Specimen: BLOOD  Result Value Ref Range Status   Specimen Description BLOOD RIGHT ANTECUBITAL  Final   Special Requests   Final    BOTTLES DRAWN AEROBIC AND ANAEROBIC Blood Culture adequate volume   Culture   Final    NO GROWTH 5 DAYS Performed at [facility], [address], [place], [place] [address]    Report Status 08/01/2022 FINAL  Final  Aerobic/Anaerobic Culture w Gram Stain  (surgical/deep wound)     Status: None   Collection Time: 07/27/22  5:04 PM   Specimen: Abscess  Result Value Ref Range Status   Specimen Description ABSCESS CHEST RIGHT  Final   Special Requests DRAIN  Final   Gram Stain NO WBC SEEN NO ORGANISMS SEEN   Final   Culture   Final    No growth aerobically or anaerobically. Performed at [place] [facility], [address], [place], [place] [address]    Report Status 08/01/2022 FINAL  Final  MRSA Next Gen by PCR, Nasal     Status: Abnormal   Collection Time: 08/01/22 11:18 PM   Specimen: Nasal Mucosa; Nasal Swab  Result Value Ref Range Status   MRSA by PCR Next Gen DETECTED (A) NOT DETECTED Final    Comment: RESULTS CALLED TO, READ BACK BY AND VERIFIED WITH RN K.DAVIDSON ON 08/02/22 AT 0117 BY NM (NOTE) The GeneXpert MRSA Assay (FDA approved for NASAL specimens only), is one component of a comprehensive MRSA colonization surveillance program. It is not intended to diagnose MRSA infection nor to guide or monitor treatment for MRSA infections. Test performance is not FDA approved in patients less than 50 years old. Performed at [facility], [address], [place], [place] [address]   Aerobic/Anaerobic Culture w Gram Stain (surgical/deep wound)     Status: None   Collection Time: 08/02/22 12:16 PM   Specimen: PATH Bone biopsy; Tissue  Result Value Ref Range Status   Specimen Description BONE  Final   Special Requests NONE  Final   Gram Stain NO WBC SEEN RARE GRAM NEGATIVE COCCI IN PAIRS   Final   Culture   Final    FEW METHICILLIN RESISTANT STAPHYLOCOCCUS AUREUS MODERATE ACTINOMYCES SPECIES Standardized susceptibility testing for this organism is not available. NO ANAEROBES ISOLATED Performed at [facility], [address], [place], [place]  [address]    Report Status 08/07/2022 FINAL  Final   Organism ID, Bacteria METHICILLIN RESISTANT STAPHYLOCOCCUS AUREUS  Final      Susceptibility   Methicillin resistant  staphylococcus aureus - MIC*    CIPROFLOXACIN >=8 RESISTANT Resistant     ERYTHROMYCIN >=8 RESISTANT Resistant     GENTAMICIN <=0.5 SENSITIVE Sensitive     OXACILLIN >=4 RESISTANT Resistant     TETRACYCLINE <=1 SENSITIVE Sensitive     VANCOMYCIN <=0.5 SENSITIVE Sensitive     TRIMETH/SULFA >=320 RESISTANT Resistant     CLINDAMYCIN <=0.25 SENSITIVE Sensitive     RIFAMPIN <=0.5 SENSITIVE Sensitive     Inducible Clindamycin NEGATIVE Sensitive     LINEZOLID 2 SENSITIVE Sensitive     * FEW METHICILLIN RESISTANT STAPHYLOCOCCUS AUREUS  Aerobic/Anaerobic Culture w Gram Stain (surgical/deep wound)     Status: None   Collection Time: 08/02/22 12:23 PM   Specimen: PATH Bone biopsy; Tissue  Result Value Ref Range Status   Specimen Description WOUND  Final   Special Requests NONE  Final   Gram Stain NO WBC SEEN RARE GRAM POSITIVE COCCI   Final   Culture   Final    FEW METHICILLIN RESISTANT STAPHYLOCOCCUS AUREUS NO ANAEROBES ISOLATED Performed at [facility], [address] [place], [place] [address]    Report Status 08/07/2022 FINAL  Final   Organism ID, Bacteria METHICILLIN RESISTANT STAPHYLOCOCCUS AUREUS  Final      Susceptibility   Methicillin resistant staphylococcus aureus - MIC*    CIPROFLOXACIN >=8 RESISTANT Resistant     ERYTHROMYCIN >=8 RESISTANT Resistant     GENTAMICIN <=0.5 SENSITIVE Sensitive     OXACILLIN >=4 RESISTANT Resistant     TETRACYCLINE <=1 SENSITIVE Sensitive     VANCOMYCIN 1 SENSITIVE Sensitive     TRIMETH/SULFA >=320 RESISTANT Resistant     CLINDAMYCIN <=0.25 SENSITIVE Sensitive     RIFAMPIN <=0.5 SENSITIVE Sensitive     Inducible Clindamycin NEGATIVE Sensitive     LINEZOLID 2 SENSITIVE Sensitive     * FEW METHICILLIN RESISTANT STAPHYLOCOCCUS AUREUS    Studies/Results: DG Chest Port 1 View  Result Date: 08/16/2022 CLINICAL DATA:  Shortness of breath EXAM: PORTABLE CHEST 1 VIEW COMPARISON:  08/11/2022 and prior studies FINDINGS: Cardiomegaly again  noted. RIGHT mid and lower lung opacities and RIGHT pleural effusion are unchanged. There is no evidence of pneumothorax. IMPRESSION: Unchanged appearance of the chest with RIGHT mid and lower lung opacities and RIGHT pleural effusion. Electronically Signed   By: Harmon Pier M.D.   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 Yetta Barre  did not see indication for Neurosurgical intervention   We have had him on daptomycin to cover the majority of his MRSA infection with doxycyline to have activity in the lungs.  Looking back I can see that he DID grow an actinomyces species from his foot  Actinomyces requires protracted treatment and its presence makes me worry he might need further debridement  The doxycycline is actually a good agent for this as well  I am now comfortable with switching him to oral antibiotics though if he is in the hospital would prefer to go with IV while he is here  I would even be comfortable switching him tomorrow if he were to be DC then  I will arrange for clinic followup if not already done.  I have personally spent 54 minutes involved in face-to-face and non-face-to-face activities for this patient on the day of the visit. Professional time spent includes the following activities: Preparing to see the patient (review of tests), Obtaining and/or reviewing separately obtained history (admission/discharge record), Performing a medically appropriate examination and/or evaluation , Ordering medications/tests/procedures, referring and communicating with other health care professionals, Documenting clinical information in the EMR, Independently interpreting results (not separately reported), Communicating results to the patient/family/caregiver, Counseling and educating the patient/family/caregiver and Care coordination (not separately reported).      LOS: 35 days   Acey Lav 08/16/2022, 2:56 PM

## 2022-08-16 NOTE — Progress Notes (Signed)
Patient ID: David [name], male   DOB: March 16, 1961, 62 y.o.   MRN: [id number]    Progress Note from the Palliative Medicine Team at [facility]   Patient Name: David [name]        Date: 08/16/2022 DOB: January 16, 1961  Age: 62 y.o. MRN#: [id number] Attending Physician: Leroy Sea, MD Primary Care Physician: [name], MD Admit Date: 07/12/2022   Medical records reviewed   David [name] is a 62 y.o. 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     Admitted  to the hospital with complaints of fever on 07/12/22.    Found to have MRSA bacteremia as well as multiple abscesses.    Originally admitted at [facility]  though blood cultures came positive for MRSA, his further workup suggested that patient had bilateral pneumonia and right-sided parapneumonic effusion, T7-T9 discitis and phlegmon, also had right foot infection.    He was seen by pulmonary, IR, neurosurgery and ID along with orthopedic surgery.  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   Complex pain in the setting of acute on chronic disease burden   Initial Palliative care was 08-13-22   I met with David [name] at the bedside and introduced myself as a provider with the palliative medicine team.  David [name] shared with me his experiences  within the healthcare system over the past year.     He reports that his pain is well-controlled on the current medication regime started by the previous PMT provider.  Ultimately his hope is continue rehabilitation in CIR by the end of the week.  Education offered on the importance of converting him to a full oral pain medication regiment in order to secure safe viable transitions of care.  Education offered on the importance of securing outpatient primary care and  outpatient  pain management specialist prior to any discharge.  I shared my concern that sometimes it can be difficult securing these specialists.    We spoke about the strict pain management regulations and the need for contracts with pain management specialist.  Symptom Management:  Acute on Chronic Pain in the setting of Discitis: - Tylenol 650mg  PO Q6H ATC  - Dilaudid 0.5mg  IVP Q6H PRN - Robaxin 500mg  TID - Oxycodone 5-10mg  PO Q4H PRN - Oxycontin 20mg  PO Q12H  - Lyrica 50mg  TID - Muscle Rub as needed - Lidoderm patch -K Pad    Opoid induced constipation: - Senna 2 Tabs PO BID - MOM twice daily - Lactulose 30ml TID - Miralax BID - Relistor ( added today by attending)    Anxiety: - Buspar 15mg  PO TID - Atarax 25mg  PO TID PRN - Seen by psychiatry 5/11         Later in the day patient called the team phone requesting a revisit secondary to pain.  I arrived to the room patient was out of bed in the chair.  He had concerns regarding getting his medications in a timely manner.   On further exploration David [name] tells me he did not know that a as needed medication was upon request.  He rates his pain at a 10+   I ordered a one-time dose of IV Dilaudid 1 mg now-this will give [place] the opportunity of getting him back on track with his oral agents.  Education offered today regarding  the importance of continued conversation with family and the  medical providers regarding overall plan of care and treatment options,  ensuring decisions are within the context of the patients values and GOCs.  I will follow-up in the morning  Questions and concerns addressed   Discussed with Dr  Thedore Mins and bedside RN    Time:  75 minutes  Detailed review of medical records ( labs, imaging, vital signs), medically appropriate exam ( MS, skin, resp)   discussed with treatment team, counseling and education to patient, family, staff, documenting clinical information, medication management,  coordination of care    Lorinda Creed NP  Palliative Medicine Team Team Phone # [phone] Pager [phone]

## 2022-08-16 NOTE — Progress Notes (Signed)
Physical Therapy Treatment Patient Details Name: David [name] MRN: [id number] DOB: 1960-08-03 Today's Date: 08/16/2022   History of Present Illness Pt is a 62 yo male admitted initially admitted to [facility] 4/9 then transfered to [facility]. Pt presented to [facility] ED with complaint of fever and found to have MRSA bacteremia and multiple abscesses, bilateral pneumonia and right-sided parapneumonic effusion, T7-T9 discitis and phlegmon, also had right foot osteomylitis. 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    PT Comments    Pt is progressing well towards his goals, able to progress ambulation distance today. Pt educated on use of TLSO for ambulation as well as Darco shoe on RLE with heel weight bearing restrictions, as pt noted to be ambulating without either device upon arrival. Pt progressing ambulation in the hallway with supervision and 2 standing rest breaks, cueing for forward gaze and taking rest breaks as needed, as well as increasing B foot clearance. Pt's discharge plans updated to home, would benefit from OPPT when cleared for back pain and progressing ambulation to no AD. Will attempt to trial [facility] during next session as pt may not be able to get a RW upon discharge. Acute PT will continue to follow as appropriate.     Recommendations for follow up therapy are one component of a multi-disciplinary discharge planning process, led by the attending physician.  Recommendations may be updated based on patient status, additional functional criteria and insurance authorization.  Follow Up Recommendations       Assistance Recommended at Discharge Intermittent Supervision/Assistance  Patient can return home with the following A little help with walking and/or transfers;Assist for transportation   Equipment Recommendations  Rolling  walker (2 wheels) (may decline)    Recommendations for Other Services       Precautions / Restrictions Precautions Precautions: Fall Required Braces or Orthoses: Spinal Brace Spinal Brace: Thoracolumbosacral orthotic;Applied in sitting position Other Brace: for use when ambulating Restrictions Weight Bearing Restrictions: Yes RLE Weight Bearing: Partial weight bearing RLE Partial Weight Bearing Percentage or Pounds: heel weight bearing Other Position/Activity Restrictions: with Darco shoe     Mobility  Bed Mobility               General bed mobility comments: pt ambulating from bathroom upon arrival, ended session with pt seated EOB and s/o present    Transfers Overall transfer level: Needs assistance Equipment used: Rolling walker (2 wheels) Transfers: Sit to/from Stand Sit to Stand: Supervision           General transfer comment: supervision to stand with increased time, cued for scooting to EOB and pushing from bed    Ambulation/Gait Ambulation/Gait assistance: Supervision Gait Distance (Feet): 500 Feet (with 2 standing rest breaks) Assistive device: Rolling walker (2 wheels) Gait Pattern/deviations: Step-through pattern, Decreased stride length, Decreased dorsiflexion - right Gait velocity: grossly WFL     General Gait Details: improved gait speed and gait pattern, performing step through gait, but noted decreased B foot clearance especially with fatigue, cueing intermittently for heel weight bearing when fatigued   Stairs             Wheelchair Mobility    Modified Rankin (Stroke Patients Only)       Balance Overall balance assessment: Needs assistance Sitting-balance support: Bilateral upper extremity supported, Feet supported Sitting balance-Leahy Scale: Good  Standing balance support: Bilateral upper extremity supported, During functional activity, Reliant on assistive device for balance Standing balance-Leahy Scale:  Poor Standing balance comment: reliant on RW                            Cognition Arousal/Alertness: Awake/alert Behavior During Therapy: WFL for tasks assessed/performed Overall Cognitive Status: Impaired/Different from baseline Area of Impairment: Memory, Safety/judgement, Problem solving                     Memory: Decreased recall of precautions   Safety/Judgement: Decreased awareness of safety, Decreased awareness of deficits   Problem Solving: Requires verbal cues General Comments: pt continues to require cueing for use of Darco shoe and TLSO, pt ambulating back from the bathroom without either donned upon arrival        Exercises      General Comments General comments (skin integrity, edema, etc.): fatiguing with mobility but recovers well with a brief standing rest break, vitals stable. TLSO donned and R Darco shoe, post-op shoe in room donned to LLE to balance out Darco shoe      Pertinent Vitals/Pain Pain Assessment Pain Assessment: Faces Faces Pain Scale: Hurts little more Pain Location: back Pain Descriptors / Indicators: Grimacing, Guarding Pain Intervention(s): Limited activity within patient's tolerance, Monitored during session, Premedicated before session, Repositioned    Home Living                          Prior Function            PT Goals (current goals can now be found in the care plan section) Acute Rehab PT Goals Patient Stated Goal: get better before going home PT Goal Formulation: With patient Time For Goal Achievement: 08/25/22 Potential to Achieve Goals: Good Progress towards PT goals: Progressing toward goals    Frequency    Min 2X/week      PT Plan Discharge plan needs to be updated;Frequency needs to be updated    Co-evaluation              AM-PAC PT "6 Clicks" Mobility   Outcome Measure  Help needed turning from your back to your side while in a flat bed without using bedrails?: A  Little Help needed moving from lying on your back to sitting on the side of a flat bed without using bedrails?: A Little Help needed moving to and from a bed to a chair (including a wheelchair)?: A Little Help needed standing up from a chair using your arms (e.g., wheelchair or bedside chair)?: A Little Help needed to walk in hospital room?: A Little Help needed climbing 3-5 steps with a railing? : A Little 6 Click Score: 18    End of Session Equipment Utilized During Treatment: Gait belt;Back brace Activity Tolerance: Patient tolerated treatment well Patient left: in bed;with call bell/phone within reach;with family/visitor present Nurse Communication: Mobility status PT Visit Diagnosis: Difficulty in walking, not elsewhere classified (R26.2);Other abnormalities of gait and mobility (R26.89)     Time: 1127-1200 PT Time Calculation (min) (ACUTE ONLY): 33 min  Charges:  $Gait Training: 23-37 mins                     Lindalou Hose, PT DPT Acute Rehabilitation Services Office [phone]    Leonie Man 08/16/2022, 3:53 PM

## 2022-08-16 NOTE — Progress Notes (Signed)
PROGRESS NOTE                                                                                                                                                                                                             Patient Demographics:    David [name], is a 62 y.o. male, DOB - 13-Nov-1960, ZOX:[id number]  Outpatient Primary MD for the patient is [name], MD    LOS - 35  Admit date - 07/12/2022    Chief Complaint  Patient presents with   Shortness of Breath       Brief Narrative (HPI from H&P)   PMH of chronic combined CHF, chronic A-fib on Eliquis, type II DM, prior amputation of right foot, HTN.  Present to the hospital with complaints of fever.  Found to have MRSA bacteremia as well as multiple abscesses.  Originally admitted at [facility] on 4/9.  Blood cultures came positive for MRSA, his further workup suggested that patient had bilateral pneumonia and right-sided parapneumonic effusion, T7-T9 discitis and phlegmon, also had right foot infection.  He was seen by pulmonary, IR, neurosurgery and ID along with orthopedic surgery.    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  He was transferred to my care on 08/11/2022 on day 30 of his hospital stay.   Significant procedures and events  4/10 ID was consulted. 4/12 TTE was negative, TEE was done which was negative for vegetation 4/18 nephrology consulted for AKI. 4/22 neurosurgery [facility]/ Dr Yetta Barre were consulted. Recommend medical management with IV antibiotics only without any intervention. Neurosurgery was consulted and recommend conservative measures. 4/23 Dr. [name] was consulted for amputation.  Vascular surgery was consulted for concerns for PVD. 4/24 IR placed right-sided chest tube for pleural effusion 4/25 underwent  right below-knee popliteal artery, tibioperoneal trunk and peroneal angioplasty with Dr. Chestine Spore. 4/26 IR remove the chest tube.  Follow-up chest x-ray still shows pleural effusions. 4/27 patient currently does not want to perform the procedure with Dr. [name].  Informed Dr. [name] as well as consulted Dr. Linna Caprice. 4/29: Patient agreeable to proceed with amputation surgery with orthopedic surgeon Dr. Odis Hollingshead 4/30: Patient underwent right fifth ray revision amputation. 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 2. Thin epidural phlegmon/abscess along the posterior T7 and T8 endplates. 3. New mild pathologic compression deformity of the T8 vertebral body with 3-4 mm retropulsion of the posterior endplate which combined with the phlegmon/absces results in mild spinal canal stenosis 08/11/2022.  Transferred to my service on day 30 of his hospital stay.   Subjective:   Patient in bed comfortably sleeping woken up, wants to know if he can get some narcotics, is having bowel movements, minimal pain at the site of previous right chest tube.  Back pain is stable    Assessment  & Plan :    Sepsis secondary to MRSA bacteremia.  Potential sources T7-T9 discitis and phlegmon, right foot infection and osteomyelitis, right-sided pneumonia and parapneumonic effusion.  He had sepsis upon admission which has resolved - Case discussed with ID Dr. Algis Liming on 08/12/2022.  T7 T9 discitis.  He was seen by neurosurgery Dr. Yetta Barre who recommended medical management for his discitis and phlegmon, ID is managing IV antibiotics currently on combination of daptomycin and doxycycline.  Right foot infection and osteomyelitis.  Patient was seen by orthopedic surgery, underwent right fifth ray amputation on 08/02/2022 by Dr. Odis Hollingshead, continue antibiotics along with supportive care.  History of bilateral pneumonia with  right-sided parapneumonic effusion.  Seen by pulmonary critical care.  Had chest tube placed by IR on 07/26/2024 and chest tube was removed on 07/28/2024.  Clinically stable from the standpoint.   Acute on chronic pain.  Currently on combination of  IV Dilaudid as well as oxycodone and scheduled Robaxin, lyrica, and OxyContin every 12, he is on multiple high doses of narcotics and doses, being managed by palliative care.  Discussed his pain control with nurse Francena Hanly in room on 08/14/2022.  He confirms that is wishing to gradually come off of narcotics for good and that he prefers them being tapered down gradually.  Pall. care team is following and managing his narcotics his presentation continues to be highly inconsistent in my evaluation.  Will defer narcotics to palliative care team.  Constipation with narcotic bowel.  Placed on Movantik and bowel regimen.  Has received 2 doses of enema, still constipated add Relistor.  Flat affect with signs of depression.  Not suicidal or homicidal, according to nursing staff and in my interview patient changes his subjective complaints and treatment desires from hour to hour, seen and cleared by Psych.  Anemia of chronic disease.  Stable.  Paroxysmal atrial fibrillation.  [place] vas 2 score of greater than 3.  Currently on digoxin, is beta-blocker along with Eliquis.  Hypertension.  Poorly controlled blood pressure adjusted beta-blocker and added Norvasc for better control.  Dyslipidemia.  On statin.    Acute on chronic combined CHF EF 60%.  Now resolved and compensated, on Coreg continue.  Currently not on diuretics.  Hyponatremia with AKI.  Dehydrated improved after IV fluids.  Peripheral vascular disease with critical limb ischemia.  Vascular surgery was consulted by Dr. [name] Underwent angiogram 4/25 with angioplasty of right below-knee popliteal artery, tibioperoneal trunk, and peroneal artery, Recommendation for aspirin, Plavix (>>Eliquis) and statin DW  Dr. Chestine Spore 08/10/24.  DM type II.  On Lantus, Premeal NovoLog and sliding scale.  Monitor and adjust.  Lab Results  Component Value Date   HGBA1C 9.1 (H) 07/13/2022   CBG (last 3)  Recent Labs    08/15/22 1652 08/15/22 2139 08/16/22 0737  GLUCAP 110* 194* 134*  Condition - Extremely Guarded  Family Communication  : Called the listed phone number for patient's friend [phone]  on 08/12/2022, phone number is disconnected.  Code Status :  Full  Consults  :  ID, orthopedics, neurosurgery, psychiatry, palliative care  PUD Prophylaxis :    Procedures  :     MRI - 1. 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 2. Thin epidural phlegmon/abscess along the posterior T7 and T8 endplates. 3. New mild pathologic compression deformity of the T8 vertebral body with 3-4 mm retropulsion of the posterior endplate which combined with the phlegmon/absces results in mild spinal canal stenosis. 4. Interval decrease in size of the lobulated fluid collection in the paraspinal soft tissues at T7-T9. 5. Complex right pleural effusion, incompletely imaged but at least somewhat decreased in size compared to the prior MRI.       Disposition Plan  :    Status is: Inpatient   DVT Prophylaxis  :     apixaban (ELIQUIS) tablet 5 mg     Lab Results  Component Value Date   PLT 330 08/16/2022    Diet :  Diet Order             Diet Carb Modified Fluid consistency: Thin; Room service appropriate? Yes; Fluid restriction: 1500 mL Fluid  Diet effective now                    Inpatient Medications  Scheduled Meds:  acetaminophen  650 mg Oral Q6H   apixaban  5 mg Oral BID   aspirin EC  81 mg Oral Daily   atorvastatin  80 mg Oral Daily   busPIRone  15 mg Oral TID   carvedilol  6.25 mg Oral BID   Chlorhexidine Gluconate Cloth  6 each Topical Daily   digoxin   125 mcg Oral Daily   docusate sodium  200 mg Oral BID   doxycycline  100 mg Oral Q12H   famotidine  20 mg Oral BID   feeding supplement (GLUCERNA SHAKE)  237 mL Oral TID BM   ferrous sulfate  325 mg Oral BID WC   folic acid  1 mg Oral Daily   insulin aspart  0-20 Units Subcutaneous TID WC   insulin glargine-yfgn  35 Units Subcutaneous Daily   lactulose  30 g Oral TID   lidocaine  1 patch Transdermal Daily   methocarbamol  500 mg Oral TID   methylnaltrexone  12 mg Subcutaneous QODAY   naloxegol oxalate  25 mg Oral Daily   oxyCODONE  20 mg Oral Q12H   polyethylene glycol  17 g Oral BID   pregabalin  50 mg Oral TID   senna-docusate  2 tablet Oral BID   Continuous Infusions:  DAPTOmycin (CUBICIN) 750 mg in sodium chloride 0.9 % IVPB 130 mL/hr at 08/16/22 0609   PRN Meds:.Glycerin (Adult), HYDROmorphone (DILAUDID) injection, hydrOXYzine, ipratropium-albuterol, labetalol, melatonin, Muscle Rub, naLOXone (NARCAN)  injection, ondansetron **OR** [DISCONTINUED] ondansetron (ZOFRAN) IV, mouth rinse, oxyCODONE, oxyCODONE, sodium chloride flush    Objective:   Vitals:   08/15/22 1656 08/15/22 2156 08/16/22 0412 08/16/22 0740  BP: 110/66 120/68 133/84 111/78  Pulse: 81   72  Resp: 17   16  Temp: 98.5 F (36.9 C) 98.3 F (36.8 C) 98.1 F (36.7 C) 98.5 F (36.9 C)  TempSrc: Oral Oral Oral   SpO2: 96%  98% 95%  Weight:      Height:        Wt Readings from Last 3 Encounters:  08/15/22 75.2 kg  07/09/22 81.6 kg  07/01/22 83.5 kg     Intake/Output Summary (Last 24 hours) at 08/16/2022 1106 Last data filed at 08/16/2022 0905 Gross per 24 hour  Intake 1055 ml  Output 625 ml  Net 430 ml     Physical Exam  Awake Alert, No new F.N deficits, anxious affect [place].AT,PERRAL Supple Neck, No JVD,   Symmetrical Chest wall movement, Good air movement bilaterally, CTAB RRR,No Gallops,Rubs or new Murmurs,  +ve B.Sounds, Abd Soft, No tenderness,   No Cyanosis, Clubbing or edema         Data Review:    Recent Labs  Lab 08/12/22 0530 08/13/22 0253 08/14/22 0341 08/15/22 0400 08/16/22 0628  WBC 11.0* 10.4 9.2 11.3* 8.5  HGB 9.2* 10.0* 9.6* 10.2* 9.3*  HCT 26.4* 30.0* 29.4* 30.9* 28.6*  PLT 352 379 390 397 330  MCV 86.8 88.0 87.2 89.6 88.3  MCH 30.3 29.3 28.5 29.6 28.7  MCHC 34.8 33.3 32.7 33.0 32.5  RDW 14.6 14.8 14.6 14.6 14.6  LYMPHSABS 1.2 0.8 1.4 1.3 1.1  MONOABS 1.2* 1.0 1.2* 1.4* 1.1*  EOSABS 0.1 0.2 0.2 0.2 0.3  BASOSABS 0.1 0.1 0.1 0.1 0.1    Recent Labs  Lab 08/11/22 0627 08/12/22 0530 08/13/22 0253 08/14/22 0341 08/15/22 0400 08/16/22 0628  NA 126* 125* 133* 130* 128*  --   K 4.8 4.0 3.9 3.8 4.6  --   CL 94* 96* 99 101 98  --   CO2 23 21* 24 23 22   --   ANIONGAP 9 8 10 6 8   --   GLUCOSE 201* 245* 113* 100* 170*  --   BUN 28* 24* 14 12 15   --   CREATININE 1.81* 1.65* 1.31* 1.32* 1.52*  --   AST 14* 15 18 19 16   --   ALT 11 12 12 15 16   --   ALKPHOS 91 93 102 93 98  --   BILITOT 0.8 0.8 0.6 0.8 1.0  --   ALBUMIN 2.0* 2.0* 2.2* 2.1* 2.3*  --   CRP 9.7* 10.8* 9.3* 8.0* 8.8*  --   PROCALCITON 0.12 0.17 0.14 0.10 0.10 <0.10  BNP  --  452.7* 477.1* 335.1* 193.9*  --   MG 1.8 1.9 1.9 1.9 1.9 1.8  CALCIUM 9.1 9.0 9.8 9.4 9.4  --    Lab Results  Component Value Date   HGBA1C 9.1 (H) 07/13/2022     Radiology Reports DG Chest Port 1 View  Result Date: 08/16/2022 CLINICAL DATA:  Shortness of breath EXAM: PORTABLE CHEST 1 VIEW COMPARISON:  08/11/2022 and prior studies FINDINGS: Cardiomegaly again noted. RIGHT mid and lower lung opacities and RIGHT pleural effusion are unchanged. There is no evidence of pneumothorax. IMPRESSION: Unchanged appearance of the chest with RIGHT mid and lower lung opacities and RIGHT pleural effusion. Electronically Signed   By: Harmon Pier M.D.   On: 08/16/2022 08:36   DG Abd Portable 1V  Result Date: 08/13/2022 CLINICAL DATA:  Shortness of breath and constipation. EXAM: PORTABLE ABDOMEN - 1 VIEW COMPARISON:  CT  07/12/2022 FINDINGS: The bowel gas pattern appears nonobstructed. Gas and stool is noted throughout the colon and rectum. No pathologic dilatation of the large or small bowel loops noted. IMPRESSION: Nonobstructive bowel gas pattern. Electronically Signed   By: Signa Kell M.D.   On: 08/13/2022 08:27  US RENAL  Result Date: 08/12/2022 CLINICAL DATA:  Acute kidney injury EXAM: RENAL / URINARY TRACT ULTRASOUND COMPLETE COMPARISON:  CT abdomen pelvis with contrast 07/12/2022 FINDINGS: Right Kidney: Renal measurements: 11.2 x 6.0 x 6.2 cm = volume: 179 mL. Echogenicity within normal limits. No mass or hydronephrosis visualized. Left Kidney: Renal measurements: 13.0 x 6.1 x 5.1 cm = volume: 209 mL. Echogenicity within normal limits. No mass or hydronephrosis visualized. Bladder: Appears normal for degree of bladder distention. Other: None. IMPRESSION: No significant sonographic abnormality of the kidneys. Electronically Signed   By: Acquanetta Belling M.D.   On: 08/12/2022 15:00      Signature  -   Susa Raring M.D on 08/16/2022 at 11:06 AM   -  To page go to [email or web address]

## 2022-08-16 NOTE — Progress Notes (Signed)
Inpatient Rehab Admissions Coordinator:    Met with patient in room. Reviewed cost estimate with patient who reports he feels like he will be able to manage at home at this point with his wife's assist. Patient was ambulating around room independently upon my arrival. He no longer feels that he needs CIR. Will sign off at this time.   Rehab Admissons Coordinator [place], [place], [place] [phone]

## 2022-08-16 NOTE — Progress Notes (Signed)
Occupational Therapy Treatment Patient Details Name: David [name] MRN: [id number] DOB: 1960/11/11 Today's Date: 08/16/2022   History of present illness Pt is a 62 yo male admitted initially admitted to [facility] 4/9 then transfered to [facility]. Pt presented to [facility] ED with complaint of fever and found to have MRSA bacteremia and multiple abscesses, bilateral pneumonia and right-sided parapneumonic effusion, T7-T9 discitis and phlegmon, also had right foot osteomylitis. 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   OT comments  Pt sitting at EOB upon OT arrival. Pt agreeable to participation in skilled OT session. OT edcuated pt in techniques for increased safety and independence with ADLs and functional transfers and mobility with RW (2 wheel). Pt demonstrates ability to complete UB/LB ADLs in sitting and in sit/stand with Supervision with cues for safety and extra time. Pt also demonstrates ability to complete functional transfers/mobility with a RW with Supervision and cues for safety. Pt is making excellent progress and goals were upgraded this session.Pt's discharge plans updated to home. Secondary to pt progress, no post acute OT follow up is indicated at this time. Pt will benefit from a toilet riser with arm rests in the home for increased safety and independence with all steps of toileting task. Pt will benefit from continued acute skilled OT services.    Recommendations for follow up therapy are one component of a multi-disciplinary discharge planning process, led by the attending physician.  Recommendations may be updated based on patient status, additional functional criteria and insurance authorization.    Assistance Recommended at Discharge Frequent or constant Supervision/Assistance  Patient can return home with the following  A  little help with walking and/or transfers;A little help with bathing/dressing/bathroom;Assistance with cooking/housework;Direct supervision/assist for medications management;Assist for transportation;Help with stairs or ramp for entrance   Equipment Recommendations  Toilet rise with handles    Recommendations for Other Services      Precautions / Restrictions Precautions Precautions: Fall Required Braces or Orthoses: Spinal Brace Spinal Brace: Thoracolumbosacral orthotic;Applied in sitting position Other Brace: for use when ambulating Restrictions Weight Bearing Restrictions: Yes RLE Weight Bearing: Partial weight bearing RLE Partial Weight Bearing Percentage or Pounds: heel weight bearing Other Position/Activity Restrictions: with Darco shoe       Mobility Bed Mobility                    Transfers Overall transfer level: Needs assistance Equipment used: Rolling walker (2 wheels) Transfers: Sit to/from Stand, Bed to chair/wheelchair/BSC Sit to Stand: Supervision     Step pivot transfers: Supervision     General transfer comment: Pt requires increased time and cues for safety.     Balance Overall balance assessment: Needs assistance Sitting-balance support: Single extremity supported, Feet supported, Bilateral upper extremity supported, No upper extremity supported Sitting balance-Leahy Scale: Good     Standing balance support: Bilateral upper extremity supported, Single extremity supported, During functional activity Standing balance-Leahy Scale: Poor Standing balance comment: Reliant on RW or grab bar during functional tasks                           ADL either performed or assessed with clinical judgement   ADL Overall ADL's : Needs assistance/impaired Eating/Feeding: Independent   Grooming: Wash/dry hands;Wash/dry face;Oral care;Supervision/safety;Standing   Upper Body Bathing: Supervision/ safety;Cueing for safety;Sitting  Lower Body  Bathing: Supervison/ safety;Cueing for safety;Cueing for compensatory techniques;Sit to/from stand Lower Body Bathing Details (indicate cue type and reason): Goal met and upgraded Upper Body Dressing : Modified independent;Sitting (with extra time)   Lower Body Dressing: Supervision/safety;Cueing for safety;Sit to/from stand;Cueing for compensatory techniques Lower Body Dressing Details (indicate cue type and reason): Goal met and upgraded Toilet Transfer: Supervision/safety;Ambulation;Regular Toilet;Grab bars;Cueing for safety (with RW (2 wheel)) Toilet Transfer Details (indicate cue type and reason): Goal met and upgraded Toileting- Clothing Manipulation and Hygiene: Supervision/safety;Cueing for safety;Sit to/from stand (utilizing RW and grab bar for unilateral UE support) Toileting - Clothing Manipulation Details (indicate cue type and reason): Goal met and upgraded     Functional mobility during ADLs: Supervision/safety;Cueing for safety;Rolling walker (2 wheels) General ADL Comments: Pt is making excellent progress and has met all goals. Goals upgraded this day. Pt continues to require extra time secondary to pain and cues for safety during ADLs.    Extremity/Trunk Assessment              Vision       Perception     Praxis      Cognition Arousal/Alertness: Awake/alert Behavior During Therapy: WFL for tasks assessed/performed Overall Cognitive Status: Impaired/Different from baseline Area of Impairment: Memory, Safety/judgement, Problem solving                     Memory: Decreased recall of precautions   Safety/Judgement: Decreased awareness of safety, Decreased awareness of deficits   Problem Solving: Requires verbal cues General Comments: Pt continues to require cueing for use of Darco shoe and TLSO.        Exercises      Shoulder Instructions       General Comments Pt continues to be limited by pain and fatigue and continues to present with  decreased safety awareness but is making excellent progress toward goals. Pt refused use of TLSO brace during this session. OT educated on importance of use during ambulation with pt reporting understanding but continuing to refuse wear.    Pertinent Vitals/ Pain       Pain Assessment Pain Assessment: Faces Faces Pain Scale: Hurts even more Pain Location: back Pain Descriptors / Indicators: Grimacing, Guarding Pain Intervention(s): Limited activity within patient's tolerance, Monitored during session  Home Living                                          Prior Functioning/Environment              Frequency  Min 2X/week        Progress Toward Goals  OT Goals(current goals can now be found in the care plan section)  Progress towards OT goals: Goals met and updated - see care plan  Acute Rehab OT Goals Patient Stated Goal: To return home independent with all self-cares OT Goal Formulation: With patient ADL Goals Pt Will Perform Grooming: Independently;standing Pt Will Perform Lower Body Bathing: with modified independence;sit to/from stand Pt Will Perform Lower Body Dressing: with modified independence;sit to/from stand Pt Will Transfer to Toilet: Independently;ambulating Pt Will Perform Toileting - Clothing Manipulation and hygiene: with modified independence;sit to/from stand  Plan Discharge plan needs to be updated;Equipment recommendations need to be updated;Other (comment) (Goals upgraded this session.)    Co-evaluation  AM-PAC OT "6 Clicks" Daily Activity     Outcome Measure   Help from another person eating meals?: None Help from another person taking care of personal grooming?: A Little Help from another person toileting, which includes using toliet, bedpan, or urinal?: A Little Help from another person bathing (including washing, rinsing, drying)?: A Little Help from another person to put on and taking off regular upper  body clothing?: A Little Help from another person to put on and taking off regular lower body clothing?: A Little 6 Click Score: 19    End of Session Equipment Utilized During Treatment: Rolling walker (2 wheels);Other (comment) (Darco shoe; Pt refused wear of TLSO brace this session.)  OT Visit Diagnosis: Unsteadiness on feet (R26.81);Muscle weakness (generalized) (M62.81);Pain   Activity Tolerance Patient limited by fatigue;Patient limited by pain   Patient Left in chair;with call bell/phone within reach;with chair alarm set   Nurse Communication Mobility status;Other (comment) (Pain level)        Time: 1610-9604 OT Time Calculation (min): 25 min  Charges: OT General Charges $OT Visit: 1 Visit OT Treatments $Self Care/Home Management : 23-37 mins  David [name] "Orson Eva., OTR/L, MA Acute Rehab [phone]   Lendon Colonel 08/16/2022, 4:54 PM

## 2022-08-17 ENCOUNTER — Other Ambulatory Visit (HOSPITAL_COMMUNITY): Payer: Self-pay

## 2022-08-17 LAB — CBC WITH DIFFERENTIAL/PLATELET
Abs Immature Granulocytes: 0.08 10*3/uL — ABNORMAL HIGH (ref 0.00–0.07)
Basophils Absolute: 0.1 10*3/uL (ref 0.0–0.1)
Basophils Relative: 1 %
Eosinophils Absolute: 0.2 10*3/uL (ref 0.0–0.5)
Eosinophils Relative: 2 %
HCT: 32 % — ABNORMAL LOW (ref 39.0–52.0)
Hemoglobin: 10.2 g/dL — ABNORMAL LOW (ref 13.0–17.0)
Immature Granulocytes: 1 %
Lymphocytes Relative: 11 %
Lymphs Abs: 1.3 10*3/uL (ref 0.7–4.0)
MCH: 28.8 pg (ref 26.0–34.0)
MCHC: 31.9 g/dL (ref 30.0–36.0)
MCV: 90.4 fL (ref 80.0–100.0)
Monocytes Absolute: 1.3 10*3/uL — ABNORMAL HIGH (ref 0.1–1.0)
Monocytes Relative: 11 %
Neutro Abs: 8.8 10*3/uL — ABNORMAL HIGH (ref 1.7–7.7)
Neutrophils Relative %: 74 %
Platelets: 405 10*3/uL — ABNORMAL HIGH (ref 150–400)
RBC: 3.54 MIL/uL — ABNORMAL LOW (ref 4.22–5.81)
RDW: 14.8 % (ref 11.5–15.5)
WBC: 11.8 10*3/uL — ABNORMAL HIGH (ref 4.0–10.5)
nRBC: 0 % (ref 0.0–0.2)

## 2022-08-17 LAB — BASIC METABOLIC PANEL
Anion gap: 9 (ref 5–15)
BUN: 15 mg/dL (ref 6–20)
CO2: 21 mmol/L — ABNORMAL LOW (ref 22–32)
Calcium: 9.5 mg/dL (ref 8.9–10.3)
Chloride: 100 mmol/L (ref 98–111)
Creatinine, Ser: 1.33 mg/dL — ABNORMAL HIGH (ref 0.61–1.24)
GFR, Estimated: >60 mL/min (ref >60)
Glucose, Bld: 118 mg/dL — ABNORMAL HIGH (ref 70–99)
Potassium: 4.4 mmol/L (ref 3.5–5.1)
Sodium: 130 mmol/L — ABNORMAL LOW (ref 135–145)

## 2022-08-17 LAB — PHOSPHORUS: Phosphorus: 3.6 mg/dL (ref 2.5–4.6)

## 2022-08-17 LAB — GLUCOSE, CAPILLARY
Glucose-Capillary: 130 mg/dL — ABNORMAL HIGH (ref 70–99)
Glucose-Capillary: 164 mg/dL — ABNORMAL HIGH (ref 70–99)
Glucose-Capillary: 87 mg/dL (ref 70–99)
Glucose-Capillary: 151 mg/dL — ABNORMAL HIGH (ref 70–99)

## 2022-08-17 LAB — MAGNESIUM: Magnesium: 1.8 mg/dL (ref 1.7–2.4)

## 2022-08-17 MED ORDER — MAGNESIUM CITRATE PO SOLN
1.0000 | Freq: Once | ORAL | Status: AC
  Administered 2022-08-17: 1 via ORAL
  Filled 2022-08-17: qty 296

## 2022-08-17 MED ORDER — DOXYCYCLINE HYCLATE 100 MG PO TABS
100.0000 mg | ORAL_TABLET | Freq: Two times a day (BID) | ORAL | 0 refills | Status: DC
  Filled 2022-08-17: qty 60, 30d supply, fill #0

## 2022-08-17 NOTE — Progress Notes (Addendum)
Patient states he is still feeling backed up and wanted to know what else he can do to help get relief. Patient encouraged to increase mobility around room to help motility.

## 2022-08-17 NOTE — Progress Notes (Signed)
Mobility Specialist Progress Note   08/17/22 1757  Mobility  Activity Ambulated with assistance to bathroom  Level of Assistance Standby assist, set-up cues, supervision of patient - no hands on  Assistive Device Cane (Single point)  Distance Ambulated (ft) 24 ft  RLE Weight Bearing PWB  RLE Partial Weight Bearing Percentage or Pounds heel weight bearing  Activity Response Tolerated well  Mobility Referral Yes  $Mobility charge 1 Mobility  Mobility Specialist Start Time (ACUTE ONLY) 1741  Mobility Specialist Stop Time (ACUTE ONLY) 1748  Mobility Specialist Time Calculation (min) (ACUTE ONLY) 7 min   Pt deferring hallway ambulation today d/t constant pain in abdominal area but requesting assistance to BR. Successful BM, returned back to chair requesting meds from RN d/t pain. RN notified.  Frederico Hamman Mobility Specialist Please contact via SecureChat or  Rehab office at [phone]

## 2022-08-17 NOTE — Progress Notes (Signed)
PROGRESS NOTE                                                                                                                                                                                                             Patient Demographics:    David [name], is a 62 y.o. male, DOB - 14-Nov-1960, EAV:[id number]  Outpatient Primary MD for the patient is [name], MD    LOS - 36  Admit date - 07/12/2022    Chief Complaint  Patient presents with   Shortness of Breath       Brief Narrative (HPI from H&P)    PMH of chronic combined CHF, chronic A-fib on Eliquis, type II DM, prior amputation of right foot, HTN.  Present to the hospital with complaints of fever.  Found to have MRSA bacteremia as well as multiple abscesses.  Originally admitted at [facility] on 4/9.  Blood cultures came positive for MRSA, his further workup suggested that patient had bilateral pneumonia and right-sided parapneumonic effusion, T7-T9 discitis and phlegmon, also had right foot infection.  He was seen by pulmonary, IR, neurosurgery and ID along with orthopedic surgery.    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  Significant procedures and events  4/10 ID was consulted. 4/12 TTE was negative, TEE was done which was negative for vegetation 4/18 nephrology consulted for AKI. 4/22 neurosurgery [facility]/ Dr Yetta Barre were consulted. Recommend medical management with IV antibiotics only without any intervention. Neurosurgery was consulted and recommend conservative measures. 4/23 Dr. [name] was consulted for amputation.  Vascular surgery was consulted for concerns for PVD. 4/24 IR placed right-sided chest tube for pleural effusion 4/25 underwent right below-knee popliteal artery, tibioperoneal trunk and peroneal  angioplasty with Dr. Chestine Spore. 4/26 IR remove the chest tube.  Follow-up chest x-ray still shows pleural effusions. 4/27 patient currently does not want to perform the procedure with Dr. [name].  Informed Dr. [name] as well as consulted Dr. Linna Caprice. 4/29: Patient agreeable to proceed with amputation surgery with orthopedic surgeon Dr. Odis Hollingshead 4/30: Patient underwent right fifth ray revision amputation. 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 2. Thin epidural phlegmon/abscess  along the posterior T7 and T8 endplates. 3. New mild pathologic compression deformity of the T8 vertebral body with 3-4 mm retropulsion of the posterior endplate which combined with the phlegmon/absces results in mild spinal canal stenosis    Subjective:   Patient sitting recliner, reports overall pain is controlled, he reports constipation.      Assessment  & Plan :    Sepsis secondary to MRSA bacteremia.  Potential sources T7-T9 discitis and phlegmon, right foot infection and osteomyelitis, right-sided pneumonia and parapneumonic effusion. He had sepsis upon admission which has resolved  T7 T9 discitis.  He was seen by neurosurgery Dr. Yetta Barre who recommended medical management for his discitis and phlegmon, ID is managing IV antibiotics currently on combination of daptomycin and doxycycline. -Patient can be transitioned to oral regimen once more stable and ready for discharge per ID.  Right foot infection and osteomyelitis.   - Patient was seen by orthopedic surgery, underwent right fifth ray amputation on 08/02/2022 by Dr. Odis Hollingshead, continue antibiotics along with supportive care.  History of bilateral pneumonia with right-sided parapneumonic effusion.  Seen by pulmonary critical care.  Had chest tube placed by IR on 07/26/2024 and chest tube was removed on 07/28/2024.  Clinically stable from the standpoint.    Acute on chronic pain.  Currently on combination of  IV Dilaudid as well as oxycodone and scheduled Robaxin, lyrica, and OxyContin every 12, he is on multiple high doses of narcotics and doses, being managed by palliative care.  Discussed his pain control with nurse Francena Hanly in room on 08/14/2022.  He confirms that is wishing to gradually come off of narcotics for good and that he prefers them being tapered down gradually.  Pall. care team is following and managing his narcotics his presentation continues to be highly inconsistent in my evaluation.  Will defer narcotics to palliative care team.  Constipation with narcotic bowel.  Placed on Movantik and bowel regimen.  Has received 2 doses of enema, still constipated add Relistor.  Flat affect with signs of depression.  Not suicidal or homicidal, according to nursing staff and in my interview patient changes his subjective complaints and treatment desires from hour to hour, seen and cleared by Psych.  Anemia of chronic disease.  Stable.  Paroxysmal atrial fibrillation.  [place] vas 2 score of greater than 3.  Currently on digoxin, is beta-blocker along with Eliquis.  Hypertension.  Poorly controlled blood pressure adjusted beta-blocker and added Norvasc for better control.  Dyslipidemia.  On statin.    Acute on chronic combined CHF EF 60%.  Now resolved and compensated, on Coreg continue.  Currently not on diuretics.  Hyponatremia with AKI.  Dehydrated improved after IV fluids.  Constipation -Will give magnesium citrate today.  Peripheral vascular disease with critical limb ischemia.  Vascular surgery was consulted by Dr. [name] Underwent angiogram 4/25 with angioplasty of right below-knee popliteal artery, tibioperoneal trunk, and peroneal artery, Recommendation for aspirin, Plavix (>>Eliquis) and statin DW Dr. Chestine Spore 08/10/24.  DM type II.  On Lantus, Premeal NovoLog and sliding scale.  Monitor and adjust.  Lab Results  Component Value Date    HGBA1C 9.1 (H) 07/13/2022   CBG (last 3)  Recent Labs    08/16/22 2105 08/17/22 0809 08/17/22 1114  GLUCAP 194* 130* 164*          Condition - Extremely Guarded  Family Communication  : Called the listed phone number for patient's friend [phone]  on 08/12/2022, phone number is disconnected.  Code Status :  Full  Consults  :  ID, orthopedics, neurosurgery, psychiatry, palliative care  PUD Prophylaxis :    Procedures  :     MRI - 1. 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 2. Thin epidural phlegmon/abscess along the posterior T7 and T8 endplates. 3. New mild pathologic compression deformity of the T8 vertebral body with 3-4 mm retropulsion of the posterior endplate which combined with the phlegmon/absces results in mild spinal canal stenosis. 4. Interval decrease in size of the lobulated fluid collection in the paraspinal soft tissues at T7-T9. 5. Complex right pleural effusion, incompletely imaged but at least somewhat decreased in size compared to the prior MRI.       Disposition Plan  :    Status is: Inpatient   DVT Prophylaxis  :     apixaban (ELIQUIS) tablet 5 mg     Lab Results  Component Value Date   PLT 405 (H) 08/17/2022    Diet :  Diet Order             Diet Carb Modified Fluid consistency: Thin; Room service appropriate? Yes; Fluid restriction: 1500 mL Fluid  Diet effective now                    Inpatient Medications  Scheduled Meds:  acetaminophen  650 mg Oral Q6H   apixaban  5 mg Oral BID   aspirin EC  81 mg Oral Daily   atorvastatin  80 mg Oral Daily   busPIRone  15 mg Oral TID   carvedilol  6.25 mg Oral BID   Chlorhexidine Gluconate Cloth  6 each Topical Daily   digoxin  125 mcg Oral Daily   docusate sodium  200 mg Oral BID   doxycycline  100 mg Oral Q12H   famotidine  20 mg Oral BID   feeding supplement  (GLUCERNA SHAKE)  237 mL Oral TID BM   ferrous sulfate  325 mg Oral BID WC   folic acid  1 mg Oral Daily   insulin aspart  0-20 Units Subcutaneous TID WC   insulin glargine-yfgn  35 Units Subcutaneous Daily   lactulose  30 g Oral TID   lidocaine  1 patch Transdermal Daily   methocarbamol  500 mg Oral TID   methylnaltrexone  12 mg Subcutaneous QODAY   naloxegol oxalate  25 mg Oral Daily   oxyCODONE  20 mg Oral Q12H   polyethylene glycol  17 g Oral BID   pregabalin  50 mg Oral TID   senna-docusate  2 tablet Oral BID   Continuous Infusions:  DAPTOmycin (CUBICIN) 750 mg in sodium chloride 0.9 % IVPB 130 mL/hr at 08/17/22 0711   PRN Meds:.Glycerin (Adult), HYDROmorphone (DILAUDID) injection, hydrOXYzine, ipratropium-albuterol, labetalol, melatonin, Muscle Rub, naLOXone (NARCAN)  injection, ondansetron **OR** [DISCONTINUED] ondansetron (ZOFRAN) IV, mouth rinse, oxyCODONE, oxyCODONE, sodium chloride flush    Objective:   Vitals:   08/16/22 1746 08/16/22 2043 08/17/22 0810 08/17/22 1114  BP:  131/89 (!) 129/91 118/81  Pulse:  80 80 82  Resp:  18 18 18   Temp:  98.4 F (36.9 C) 98.7 F (37.1 C) 98 F (36.7 C)  TempSrc:  Oral Oral Oral  SpO2:  97% 97% 98%  Weight: 79.1 kg     Height:        Wt Readings from Last 3 Encounters:  08/16/22 79.1 kg  07/09/22 81.6 kg  07/01/22  83.5 kg     Intake/Output Summary (Last 24 hours) at 08/17/2022 1258 Last data filed at 08/17/2022 0711 Gross per 24 hour  Intake 565 ml  Output --  Net 565 ml     Physical Exam  Awake Alert, Oriented X 3, No new F.N deficits, Normal affect Symmetrical Chest wall movement, Good air movement bilaterally, CTAB RRR,No Gallops,Rubs or new Murmurs, No Parasternal Heave +ve B.Sounds, Abd Soft, No tenderness, No rebound - guarding or rigidity. No Cyanosis, Clubbing or edema, right foot bandaged        Data Review:    Recent Labs  Lab 08/13/22 0253 08/14/22 0341 08/15/22 0400 08/16/22 0628  08/17/22 0739  WBC 10.4 9.2 11.3* 8.5 11.8*  HGB 10.0* 9.6* 10.2* 9.3* 10.2*  HCT 30.0* 29.4* 30.9* 28.6* 32.0*  PLT 379 390 397 330 405*  MCV 88.0 87.2 89.6 88.3 90.4  MCH 29.3 28.5 29.6 28.7 28.8  MCHC 33.3 32.7 33.0 32.5 31.9  RDW 14.8 14.6 14.6 14.6 14.8  LYMPHSABS 0.8 1.4 1.3 1.1 1.3  MONOABS 1.0 1.2* 1.4* 1.1* 1.3*  EOSABS 0.2 0.2 0.2 0.3 0.2  BASOSABS 0.1 0.1 0.1 0.1 0.1    Recent Labs  Lab 08/11/22 0627 08/12/22 0530 08/13/22 0253 08/14/22 0341 08/15/22 0400 08/16/22 0628 08/17/22 0739  NA 126* 125* 133* 130* 128*  --   --   K 4.8 4.0 3.9 3.8 4.6  --   --   CL 94* 96* 99 101 98  --   --   CO2 23 21* 24 23 22   --   --   ANIONGAP 9 8 10 6 8   --   --   GLUCOSE 201* 245* 113* 100* 170*  --   --   BUN 28* 24* 14 12 15   --   --   CREATININE 1.81* 1.65* 1.31* 1.32* 1.52*  --   --   AST 14* 15 18 19 16   --   --   ALT 11 12 12 15 16   --   --   ALKPHOS 91 93 102 93 98  --   --   BILITOT 0.8 0.8 0.6 0.8 1.0  --   --   ALBUMIN 2.0* 2.0* 2.2* 2.1* 2.3*  --   --   CRP 9.7* 10.8* 9.3* 8.0* 8.8*  --   --   PROCALCITON 0.12 0.17 0.14 0.10 0.10 <0.10  --   BNP  --  452.7* 477.1* 335.1* 193.9*  --   --   MG 1.8 1.9 1.9 1.9 1.9 1.8 1.8  CALCIUM 9.1 9.0 9.8 9.4 9.4  --   --    Lab Results  Component Value Date   HGBA1C 9.1 (H) 07/13/2022     Radiology Reports DG Chest Port 1 View  Result Date: 08/16/2022 CLINICAL DATA:  Shortness of breath EXAM: PORTABLE CHEST 1 VIEW COMPARISON:  08/11/2022 and prior studies FINDINGS: Cardiomegaly again noted. RIGHT mid and lower lung opacities and RIGHT pleural effusion are unchanged. There is no evidence of pneumothorax. IMPRESSION: Unchanged appearance of the chest with RIGHT mid and lower lung opacities and RIGHT pleural effusion. Electronically Signed   By: Harmon Pier M.D.   On: 08/16/2022 08:36      Signature  -   Huey Bienenstock M.D on 08/17/2022 at 12:58 PM   -  To page go to [email or web address]

## 2022-08-18 ENCOUNTER — Ambulatory Visit: Payer: Self-pay | Attending: Physician Assistant | Admitting: Physician Assistant

## 2022-08-18 DIAGNOSIS — E785 Hyperlipidemia, unspecified: Secondary | ICD-10-CM

## 2022-08-18 DIAGNOSIS — R531 Weakness: Secondary | ICD-10-CM

## 2022-08-18 DIAGNOSIS — I10 Essential (primary) hypertension: Secondary | ICD-10-CM

## 2022-08-18 DIAGNOSIS — Z79899 Other long term (current) drug therapy: Secondary | ICD-10-CM

## 2022-08-18 DIAGNOSIS — I4819 Other persistent atrial fibrillation: Secondary | ICD-10-CM

## 2022-08-18 DIAGNOSIS — I5020 Unspecified systolic (congestive) heart failure: Secondary | ICD-10-CM

## 2022-08-18 LAB — GLUCOSE, CAPILLARY
Glucose-Capillary: 172 mg/dL — ABNORMAL HIGH (ref 70–99)
Glucose-Capillary: 185 mg/dL — ABNORMAL HIGH (ref 70–99)
Glucose-Capillary: 110 mg/dL — ABNORMAL HIGH (ref 70–99)
Glucose-Capillary: 115 mg/dL — ABNORMAL HIGH (ref 70–99)
Glucose-Capillary: 149 mg/dL — ABNORMAL HIGH (ref 70–99)

## 2022-08-18 LAB — MAGNESIUM: Magnesium: 1.8 mg/dL (ref 1.7–2.4)

## 2022-08-18 LAB — CBC WITH DIFFERENTIAL/PLATELET
Abs Immature Granulocytes: 0.06 10*3/uL (ref 0.00–0.07)
Basophils Absolute: 0.1 10*3/uL (ref 0.0–0.1)
Basophils Relative: 1 %
Eosinophils Absolute: 0.2 10*3/uL (ref 0.0–0.5)
Eosinophils Relative: 3 %
HCT: 29.6 % — ABNORMAL LOW (ref 39.0–52.0)
Hemoglobin: 9.6 g/dL — ABNORMAL LOW (ref 13.0–17.0)
Immature Granulocytes: 1 %
Lymphocytes Relative: 15 %
Lymphs Abs: 1.2 10*3/uL (ref 0.7–4.0)
MCH: 28.8 pg (ref 26.0–34.0)
MCHC: 32.4 g/dL (ref 30.0–36.0)
MCV: 88.9 fL (ref 80.0–100.0)
Monocytes Absolute: 0.9 10*3/uL (ref 0.1–1.0)
Monocytes Relative: 11 %
Neutro Abs: 5.9 10*3/uL (ref 1.7–7.7)
Neutrophils Relative %: 69 %
Platelets: 361 10*3/uL (ref 150–400)
RBC: 3.33 MIL/uL — ABNORMAL LOW (ref 4.22–5.81)
RDW: 14.7 % (ref 11.5–15.5)
WBC: 8.5 10*3/uL (ref 4.0–10.5)
nRBC: 0 % (ref 0.0–0.2)

## 2022-08-18 LAB — PHOSPHORUS: Phosphorus: 3.1 mg/dL (ref 2.5–4.6)

## 2022-08-18 MED ORDER — HYDROMORPHONE HCL 1 MG/ML IJ SOLN
0.5000 mg | Freq: Four times a day (QID) | INTRAMUSCULAR | Status: DC | PRN
  Administered 2022-08-18 – 2022-08-19 (×3): 0.5 mg via INTRAVENOUS
  Filled 2022-08-18 (×4): qty 0.5

## 2022-08-18 NOTE — Progress Notes (Signed)
      INFECTIOUS DISEASE ATTENDING ADDENDUM:   Date: 08/18/2022  Patient name: David [name]  Medical record number: [id number]  Date of birth: Jun 17, 1960   I am comfortable switching him to oral doxycycline alone  I would provide him meds via TOC for one month--though he will need much more protracted therapy   Jayzeon Whoolery has an appointment on September 08, 2022 at 2pm with Dr. Daiva Eves  at  [facility] for Infectious Disease, which  is located in the [facility] at  [address] in [place].  [address], which is located to the left of the elevators.  Phone: [phone]  Fax: [phone]  [email or web address]  The patient should arrive 30 minutes prior to their appoitment.  I will sign off for now  Please call with further questions.   Acey Lav 08/18/2022, 10:31 AM

## 2022-08-18 NOTE — Progress Notes (Signed)
PROGRESS NOTE                                                                                                                                                                                                             Patient Demographics:    David [name], is a 62 y.o. male, DOB - 06-14-1960, ZOX:[id number]  Outpatient Primary MD for the patient is [name], MD    LOS - 37  Admit date - 07/12/2022    Chief Complaint  Patient presents with   Shortness of Breath       Brief Narrative (HPI from H&P)    PMH of chronic combined CHF, chronic A-fib on Eliquis, type II DM, prior amputation of right foot, HTN.  Present to the hospital with complaints of fever.  Found to have MRSA bacteremia as well as multiple abscesses.  Originally admitted at [facility] on 4/9.  Blood cultures came positive for MRSA, his further workup suggested that patient had bilateral pneumonia and right-sided parapneumonic effusion, T7-T9 discitis and phlegmon, also had right foot infection.  He was seen by pulmonary, IR, neurosurgery and ID along with orthopedic surgery.    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  Significant procedures and events  4/10 ID was consulted. 4/12 TTE was negative, TEE was done which was negative for vegetation 4/18 nephrology consulted for AKI. 4/22 neurosurgery [facility]/ Dr Yetta Barre were consulted. Recommend medical management with IV antibiotics only without any intervention. Neurosurgery was consulted and recommend conservative measures. 4/23 Dr. [name] was consulted for amputation.  Vascular surgery was consulted for concerns for PVD. 4/24 IR placed right-sided chest tube for pleural effusion 4/25 underwent right below-knee popliteal artery, tibioperoneal trunk and peroneal  angioplasty with Dr. Chestine Spore. 4/26 IR remove the chest tube.  Follow-up chest x-ray still shows pleural effusions. 4/27 patient currently does not want to perform the procedure with Dr. [name].  Informed Dr. [name] as well as consulted Dr. Linna Caprice. 4/29: Patient agreeable to proceed with amputation surgery with orthopedic surgeon Dr. Odis Hollingshead 4/30: Patient underwent right fifth ray revision amputation. 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 2. Thin epidural phlegmon/abscess  along the posterior T7 and T8 endplates. 3. New mild pathologic compression deformity of the T8 vertebral body with 3-4 mm retropulsion of the posterior endplate which combined with the phlegmon/absces results in mild spinal canal stenosis    Subjective:   Patient complaining of back pain overnight, otherwise no significant events.    Assessment  & Plan :    Sepsis secondary to MRSA bacteremia.  Potential sources T7-T9 discitis and phlegmon, right foot infection and osteomyelitis, right-sided pneumonia and parapneumonic effusion. He had sepsis upon admission which has resolved  T7 T9 discitis.  He was seen by neurosurgery Dr. Yetta Barre who recommended medical management for his discitis and phlegmon, ID is managing IV antibiotics currently on combination of daptomycin and doxycycline. -ID input greatly appreciated, he will be changed to oral doxycycline today.    Right foot infection and osteomyelitis.   - Patient was seen by orthopedic surgery, underwent right fifth ray amputation on 08/02/2022 by Dr. Odis Hollingshead, continue antibiotics along with supportive care.  History of bilateral pneumonia with right-sided parapneumonic effusion.  Seen by pulmonary critical care.  Had chest tube placed by IR on 07/26/2024 and chest tube was removed on 07/28/2024.  Clinically stable from the standpoint.   Acute on chronic pain.   Currently on combination of  IV Dilaudid as well as oxycodone and scheduled Robaxin, lyrica, and OxyContin every 12, he is on multiple high doses of narcotics and doses, being managed by palliative care.  Discussed his pain control with nurse Francena Hanly in room on 08/14/2022.  He confirms that is wishing to gradually come off of narcotics for good and that he prefers them being tapered down gradually.  Pall. care team is following and managing his narcotics his presentation continues to be highly inconsistent in my evaluation.  Will defer narcotics to palliative care team.  Constipation with narcotic bowel.  Placed on Movantik and bowel regimen.  Has received 2 doses of enema, still constipated add Relistor.  Flat affect with signs of depression.  Not suicidal or homicidal, according to nursing staff and in my interview patient changes his subjective complaints and treatment desires from hour to hour, seen and cleared by Psych.  Anemia of chronic disease.  Stable.  Paroxysmal atrial fibrillation.  [place] vas 2 score of greater than 3.  Currently on digoxin, is beta-blocker along with Eliquis.  Hypertension.  Poorly controlled blood pressure adjusted beta-blocker and added Norvasc for better control.  Dyslipidemia.  On statin.    Acute on chronic combined CHF EF 60%.  Now resolved and compensated, on Coreg continue.  Currently not on diuretics.  Hyponatremia with AKI.  Dehydrated improved after IV fluids.  Constipation -Will give magnesium citrate today.  Peripheral vascular disease with critical limb ischemia.  Vascular surgery was consulted by Dr. [name] Underwent angiogram 4/25 with angioplasty of right below-knee popliteal artery, tibioperoneal trunk, and peroneal artery, Recommendation for aspirin, Plavix (>>Eliquis) and statin DW Dr. Chestine Spore 08/10/24.  DM type II.  On Lantus, Premeal NovoLog and sliding scale.  Monitor and adjust.  Lab Results  Component Value Date   HGBA1C 9.1 (H) 07/13/2022    CBG (last 3)  Recent Labs    08/17/22 2106 08/18/22 0735 08/18/22 1155  GLUCAP 151* 172* 185*          Condition - Extremely Guarded  Family Communication  : Called the listed phone number for patient's friend [phone]  on 08/12/2022, phone number is disconnected.  Code Status :  Full  Consults  :  ID, orthopedics, neurosurgery, psychiatry, palliative care  PUD Prophylaxis :    Procedures  :     MRI - 1. 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 2. Thin epidural phlegmon/abscess along the posterior T7 and T8 endplates. 3. New mild pathologic compression deformity of the T8 vertebral body with 3-4 mm retropulsion of the posterior endplate which combined with the phlegmon/absces results in mild spinal canal stenosis. 4. Interval decrease in size of the lobulated fluid collection in the paraspinal soft tissues at T7-T9. 5. Complex right pleural effusion, incompletely imaged but at least somewhat decreased in size compared to the prior MRI.       Disposition Plan  :    Status is: Inpatient   DVT Prophylaxis  :     apixaban (ELIQUIS) tablet 5 mg     Lab Results  Component Value Date   PLT 361 08/18/2022    Diet :  Diet Order             Diet Carb Modified Fluid consistency: Thin; Room service appropriate? Yes; Fluid restriction: 1500 mL Fluid  Diet effective now                    Inpatient Medications  Scheduled Meds:  acetaminophen  650 mg Oral Q6H   apixaban  5 mg Oral BID   aspirin EC  81 mg Oral Daily   atorvastatin  80 mg Oral Daily   busPIRone  15 mg Oral TID   carvedilol  6.25 mg Oral BID   Chlorhexidine Gluconate Cloth  6 each Topical Daily   digoxin  125 mcg Oral Daily   docusate sodium  200 mg Oral BID   doxycycline  100 mg Oral Q12H   famotidine  20 mg Oral BID   feeding supplement (GLUCERNA SHAKE)  237 mL Oral  TID BM   ferrous sulfate  325 mg Oral BID WC   folic acid  1 mg Oral Daily   insulin aspart  0-20 Units Subcutaneous TID WC   insulin glargine-yfgn  35 Units Subcutaneous Daily   lactulose  30 g Oral TID   lidocaine  1 patch Transdermal Daily   methocarbamol  500 mg Oral TID   methylnaltrexone  12 mg Subcutaneous QODAY   naloxegol oxalate  25 mg Oral Daily   oxyCODONE  20 mg Oral Q12H   polyethylene glycol  17 g Oral BID   pregabalin  50 mg Oral TID   senna-docusate  2 tablet Oral BID   Continuous Infusions:   PRN Meds:.Glycerin (Adult), HYDROmorphone (DILAUDID) injection, hydrOXYzine, ipratropium-albuterol, labetalol, melatonin, Muscle Rub, naLOXone (NARCAN)  injection, ondansetron **OR** [DISCONTINUED] ondansetron (ZOFRAN) IV, mouth rinse, oxyCODONE, oxyCODONE, sodium chloride flush    Objective:   Vitals:   08/17/22 1609 08/17/22 1959 08/18/22 0734 08/18/22 1153  BP: 112/79 133/78 113/78 103/63  Pulse: 82 85 88 75  Resp: 18 18 19 18   Temp: 98.5 F (36.9 C) 98.2 F (36.8 C) 98.1 F (36.7 C)   TempSrc: Oral Oral Oral   SpO2: 98% 98%    Weight:      Height:        Wt Readings from Last 3 Encounters:  08/17/22 79.4 kg  07/09/22 81.6 kg  07/01/22 83.5 kg     Intake/Output Summary (Last 24 hours) at 08/18/2022 1349 Last data filed at 08/18/2022 0500 Gross per 24 hour  Intake 720 ml  Output --  Net 720 ml     Physical Exam  Awake Alert, Oriented X 3, No new F.N deficits, Normal affect Symmetrical Chest wall movement, Good air movement bilaterally, CTAB RRR,No Gallops,Rubs or new Murmurs, No Parasternal Heave +ve B.Sounds, Abd Soft, No tenderness, No rebound - guarding or rigidity. No Cyanosis, Clubbing or edema, right foot bandaged        Data Review:    Recent Labs  Lab 08/14/22 0341 08/15/22 0400 08/16/22 0628 08/17/22 0739 08/18/22 0343  WBC 9.2 11.3* 8.5 11.8* 8.5  HGB 9.6* 10.2* 9.3* 10.2* 9.6*  HCT 29.4* 30.9* 28.6* 32.0* 29.6*  PLT 390 397  330 405* 361  MCV 87.2 89.6 88.3 90.4 88.9  MCH 28.5 29.6 28.7 28.8 28.8  MCHC 32.7 33.0 32.5 31.9 32.4  RDW 14.6 14.6 14.6 14.8 14.7  LYMPHSABS 1.4 1.3 1.1 1.3 1.2  MONOABS 1.2* 1.4* 1.1* 1.3* 0.9  EOSABS 0.2 0.2 0.3 0.2 0.2  BASOSABS 0.1 0.1 0.1 0.1 0.1    Recent Labs  Lab 08/12/22 0530 08/13/22 0253 08/14/22 0341 08/15/22 0400 08/16/22 0628 08/17/22 0733 08/17/22 0739 08/18/22 0343  NA 125* 133* 130* 128*  --  130*  --   --   K 4.0 3.9 3.8 4.6  --  4.4  --   --   CL 96* 99 101 98  --  100  --   --   CO2 21* 24 23 22   --  21*  --   --   ANIONGAP 8 10 6 8   --  9  --   --   GLUCOSE 245* 113* 100* 170*  --  118*  --   --   BUN 24* 14 12 15   --  15  --   --   CREATININE 1.65* 1.31* 1.32* 1.52*  --  1.33*  --   --   AST 15 18 19 16   --   --   --   --   ALT 12 12 15 16   --   --   --   --   ALKPHOS 93 102 93 98  --   --   --   --   BILITOT 0.8 0.6 0.8 1.0  --   --   --   --   ALBUMIN 2.0* 2.2* 2.1* 2.3*  --   --   --   --   CRP 10.8* 9.3* 8.0* 8.8*  --   --   --   --   PROCALCITON 0.17 0.14 0.10 0.10 <0.10  --   --   --   BNP 452.7* 477.1* 335.1* 193.9*  --   --   --   --   MG 1.9 1.9 1.9 1.9 1.8  --  1.8 1.8  CALCIUM 9.0 9.8 9.4 9.4  --  9.5  --   --    Lab Results  Component Value Date   HGBA1C 9.1 (H) 07/13/2022     Radiology Reports DG Chest Port 1 View  Result Date: 08/16/2022 CLINICAL DATA:  Shortness of breath EXAM: PORTABLE CHEST 1 VIEW COMPARISON:  08/11/2022 and prior studies FINDINGS: Cardiomegaly again noted. RIGHT mid and lower lung opacities and RIGHT pleural effusion are unchanged. There is no evidence of pneumothorax. IMPRESSION: Unchanged appearance of the chest with RIGHT mid and lower lung opacities and RIGHT pleural effusion. Electronically Signed   By: Harmon Pier M.D.   On: 08/16/2022 08:36  Signature  -   Huey Bienenstock M.D on 08/18/2022 at 1:49 PM   -  To page go to [email or web address]

## 2022-08-18 NOTE — Progress Notes (Signed)
Occupational Therapy Treatment Patient Details Name: David [name] MRN: [id number] DOB: December 23, 1960 Today's Date: 08/18/2022   History of present illness Pt is a 62 yo male admitted initially admitted to [facility] 4/9 then transfered to [facility]. Pt presented to [facility] ED with complaint of fever and found to have MRSA bacteremia and multiple abscesses, bilateral pneumonia and right-sided parapneumonic effusion, T7-T9 discitis and phlegmon, also had right foot osteomylitis. 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   OT comments  Pt ambulating in room with RW (2 wheel) without staff present upon OT arrival. Pt agreeable to participation in skilled OT session. OT educated pt in energy conservation techniques as described below to increase pt safety and independence with ADLs and functional mobility. Pt verbalized understanding of education and will benefit from reinforcement. OT also adjusted center support and shoulder straps of pt's TLSO brace for increased comfort and improved pt compliance with wear. Pt is making good progress toward goals. Pt sitting in recliner with call bell in reach and all needs met at end of session. Pt will benefit from continued acute skilled OT services. Discharge remains appropriate.    Recommendations for follow up therapy are one component of a multi-disciplinary discharge planning process, led by the attending physician.  Recommendations may be updated based on patient status, additional functional criteria and insurance authorization.    Assistance Recommended at Discharge Frequent or constant Supervision/Assistance  Patient can return home with the following  A little help with walking and/or transfers;A little help with bathing/dressing/bathroom;Assistance with cooking/housework;Direct supervision/assist for  medications management;Assist for transportation;Help with stairs or ramp for entrance   Equipment Recommendations  Toilet rise with handles    Recommendations for Other Services      Precautions / Restrictions Precautions Precautions: Fall Required Braces or Orthoses: Spinal Brace Spinal Brace: Thoracolumbosacral orthotic;Applied in sitting position Other Brace: for use when ambulating Restrictions Weight Bearing Restrictions: Yes RLE Weight Bearing: Partial weight bearing RLE Partial Weight Bearing Percentage or Pounds: heel weight bearing Other Position/Activity Restrictions: with Darco shoe       Mobility Bed Mobility                    Transfers Overall transfer level: Needs assistance Equipment used: Rolling walker (2 wheels) Transfers: Sit to/from Stand Sit to Stand: Supervision           General transfer comment: Pt requires increased time and cues for safety.     Balance Overall balance assessment: Needs assistance Sitting-balance support: Single extremity supported, Bilateral upper extremity supported, No upper extremity supported, Feet supported Sitting balance-Leahy Scale: Good     Standing balance support: Single extremity supported, Bilateral upper extremity supported, During functional activity, No upper extremity supported, Reliant on assistive device for balance Standing balance-Leahy Scale: Fair Standing balance comment: Reliant on RW to maintain balance during dynamic standing                           ADL either performed or assessed with clinical judgement   ADL Overall ADL's : Needs assistance/impaired                                 Functional mobility during ADLs: Supervision/safety;Cueing for safety;Rolling walker (2  wheels) General ADL Comments: Pt is making good progress toward goals. OT educated pt in energy conservation techniques (pacing, rest breaks, scheduling activities with energy conservation in  mind, and sitting during tasks) with pt verbalizing understanding. Pt will benefit from reinforcement of all education.    Extremity/Trunk Assessment              Vision       Perception     Praxis      Cognition Arousal/Alertness: Awake/alert Behavior During Therapy: WFL for tasks assessed/performed Overall Cognitive Status: Impaired/Different from baseline Area of Impairment: Memory, Safety/judgement, Problem solving                     Memory: Decreased recall of precautions   Safety/Judgement: Decreased awareness of safety, Decreased awareness of deficits   Problem Solving: Requires verbal cues General Comments: Pt continues to require cueing for use of Darco shoe and TLSO.        Exercises      Shoulder Instructions       General Comments TLSO brace center support and shoulder straps adjusted this session for pt comfort and increased tolerance of wear. Pt also reeducated in purpose and benefits of wear of TLSO brace.    Pertinent Vitals/ Pain       Pain Assessment Pain Assessment: Faces Faces Pain Scale: Hurts little more Pain Location: back Pain Descriptors / Indicators: Grimacing, Guarding Pain Intervention(s): Limited activity within patient's tolerance, Monitored during session, Repositioned  Home Living                                          Prior Functioning/Environment              Frequency           Progress Toward Goals  OT Goals(current goals can now be found in the care plan section)  Progress towards OT goals: Progressing toward goals  Acute Rehab OT Goals Patient Stated Goal: To return home and be independent with self-care and home management tasks  Plan Discharge plan remains appropriate    Co-evaluation                 AM-PAC OT "6 Clicks" Daily Activity     Outcome Measure   Help from another person eating meals?: None Help from another person taking care of personal grooming?:  A Little Help from another person toileting, which includes using toliet, bedpan, or urinal?: A Little Help from another person bathing (including washing, rinsing, drying)?: A Little Help from another person to put on and taking off regular upper body clothing?: A Little Help from another person to put on and taking off regular lower body clothing?: A Little 6 Click Score: 19    End of Session Equipment Utilized During Treatment: Rolling walker (2 wheels);Other (comment) (Darco shoe, TLSO brace)      Activity Tolerance Patient tolerated treatment well   Patient Left in chair;with call bell/phone within reach   Nurse Communication Mobility status        Time: 1610-9604 OT Time Calculation (min): 23 min  Charges: OT General Charges $OT Visit: 1 Visit OT Treatments $Self Care/Home Management : 8-22 mins $Orthotics Fit/Training: 8-22 mins  Lilian Fuhs "Orson Eva., OTR/L, MA Acute Rehab [phone]   Lendon Colonel 08/18/2022, 4:39 PM

## 2022-08-18 NOTE — Progress Notes (Signed)
Patient ID: David [name], male   DOB: 1960/09/01, 62 y.o.   MRN: [id number]    Progress Note from the Palliative Medicine Team at [facility]   Patient Name: David [name]        Date: 08/18/2022 DOB: 12/12/1960  Age: 62 y.o. MRN#: [id number] Attending Physician: Starleen Arms, MD Primary Care Physician: [name], MD Admit Date: 07/12/2022   Medical records reviewed, assessed patient at bedside   David [name] is a 62 y.o. 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     Admitted  to the hospital with complaints of fever on 07/12/22.    Found to have MRSA bacteremia as well as multiple abscesses.    Originally admitted at [facility]  though blood cultures came positive for MRSA, his further workup suggested that patient had bilateral pneumonia and right-sided parapneumonic effusion, T7-T9 discitis and phlegmon, also had right foot infection.    He was seen by pulmonary, IR, neurosurgery and ID along with orthopedic surgery.  He had right-sided chest tube placement and removal, right  toe amputation on 08/02/2022, neurosurgery recommended medical management for which ID has him on antibiotics.   Complex pain in the setting of acute on chronic disease burden   Initial Palliative care was 08-13-22   I visited  with David [name] at the bedside.  He reports acceptable pain control on  current medication regime.    Conversation and education regarding the difficult pain control in patients with OM.  I encouraged David [name] to investigate OP pain management clinic and support from a talk therapist.  We discussed pain having a component of not just physical but emotional and spiritual as well.  IV Dilaudid decreased by 50 % today and discontinued tomorrow.   Anticipated dc tomorrow.    Symptom Management:  Acute on Chronic Pain in the setting of Discitis: - Tylenol 650mg  PO Q6H ATC  - Dilaudid  0.5mg  IVP Q6H PRN - Robaxin 500mg  TID - Oxycodone 5-10mg  PO Q4H PRN - Oxycontin 20mg  PO Q12H  - Lyrica 50mg  TID - Muscle Rub as needed - Lidoderm patch -K Pad    Opoid induced constipation: - Senna 2 Tabs PO BID - MOM twice daily - Lactulose 30ml TID - Miralax BID - Relistor ( added today by attending)    Anxiety: - Buspar 15mg  PO TID - Atarax 25mg  PO TID PRN - Seen by psychiatry 5/11   Education offered today regarding  the importance of continued conversation with family and the  medical providers regarding overall plan of care and treatment options,  ensuring decisions are within the context of the patients values and GOCs.  PMT will continue to support holistically   Discussed  with Dr Randol Kern  Questions and concerns addressed     Time:  50 minutes  Detailed review of medical records ( labs, imaging, vital signs), medically appropriate exam ( MS, skin, resp)   discussed with treatment team, counseling and education to patient, family, staff, documenting clinical information, medication management, coordination of care    Lorinda Creed NP  Palliative Medicine Team Team Phone # [phone] Pager [phone]

## 2022-08-18 NOTE — Discharge Instructions (Signed)
[place] assistance programs Crisis assistance programs   -[facility] Coordinated Entry Program. If you are experiencing homelessness in [place], [place], your first point of contact should be Coordinated Entry. You can reach Coordinated Entry by calling (336) 553-2716 or by emailing [email or web address].  Community access points: [place] [facility] ([address], [place]) every Tuesday from 9am-10am. [facility] ([address], [place]) every Wednesday from 8am-9am.   -[facility] (336-271-5959) offers several services to local families, as funding allows. The Emergency Assistance Program (EAP), which they administer, provides household goods, free food, clothing, and financial aid to people in need in the [place] area. The EAP program does have some qualification, and counselors will interview clients for financial assistance by written referral only. Referrals need to be made by the Department of Social Services or by other EAP approved human services agencies or charities in the area.  -[facility] [place], which can be reached at (336) 885-0191, offers emergency assistance programs for those in need of help, such as food, rent assistance, a soup kitchen, shelter, and clothing. They are based in [place] but provide a number of services to those that qualify for assistance.   -[place] Department of Social Services may be able to offer temporary financial assistance and cash grants for paying rent and utilities, Help may be provided for local county residents who may be experiencing personal crisis when other resources, including government programs, are not available. Call (336) 845-7756  -[place] [facility] is a [employer] funded nonprofit agency, The organization can offer emergency assistance for paying rent, electric bills, utilities,  food, household products and furniture. They offer extensive emergency and transitional housing for families, children and single women, and also run a Boy's and Girl's Club. Thrift Shops, Clothing Banks, and other aid offered too. [address], [place], [place] [address], (336) 881-5400  -[place] Low Income Energy Assistance Program -- This is offered for [place] families. The federal government created Low Income Energy Assistance Program provides a one-time cash grant payment to help eligible low-income families pay their electric and heating bills. [address], [place], [place] [address], (336) 641-3000  -[place] Emergency Assistance -- A program offers emergency utility and rent funds for greater [place] area residents. The program can also provide counseling and referrals to charities and government programs. Also provides food and a free meal program that serves lunch Mondays - Saturdays and dinner seven days per week to individuals in the community. [address], [place], [place] [address], (336) 885-0191  -[facility] [employer] - Offers affordable apartment and housing communities across      [place] and [place]. The low income and seniors can access public housing, rental assistance to qualified applicants, and apply for the section 8 rent subsidy program. Other programs include Family Self-Sufficiency and Neighborhood Resource Centers. [address], [place], [place] [address], dial (336) 275-8501.  -[facility] provides transitional housing to veterans and the disabled. Clients will also access other services too, including assistance in applying for Disability, life skills classes, case management, and assistance in finding permanent housing. [address], [place],  [address], call (336) 275-8585  -[facility] through  [facility] is  for people who were just evicted or that are formerly homeless. The non-profit will also help then gain self-sufficiency, find a home or apartment   to live in, and also provides information on rent assistance when needed. Phone (336) 286-6401  -The [place] [facility] Weatherization Assistance Program helps low income, elderly, or disabled residents in seven counties in the [place] ([place], [place], [place], [place], [place], [place], , and [place]) save energy and reduce their utility bills by improving energy efficiency. Phone 336-904-0338.  -[facility] is located in the [facility] in the [address], [address], [address], [place], [place] [address]. Parking is in the rear of the building. Phone: 336-691-9521   General Email: info@gso[facility].org  [facility] provides free housing counseling assistance in locating affordable rental housing or housing with support services for families and individuals in crisis and the chronically homeless. We provide potential resources for other housing needs like utilities. Our trained counselors also work with clients on budgeting and financial literacy in effort to empower them to take control of their financial situations. [facility] collaborates with homeless service providers and other stakeholders as part of the [place] COC (Continuum of Care). The (COC) is a regional/local planning body that coordinates housing and services funding for homeless families and individuals. The role of [facility] in the COC is through housing counseling to work with people we serve on diversion strategies for those that are at imminent risk of becoming homeless. We also work with the Coordinated Assessment/Entry Specialist who attempts to find temporary solutions and/or connects the people to Housing First, Rapid Re-housing or transitional housing programs. Our Homelessness Prevention Housing Counselors  meet with clients on business days (Monday-Fridays, except scheduled holidays) from 8:30 am to 4:30 pm.  Legal assistance for evictions, foreclosure, and more -If you need free legal advice on civil issues, such as foreclosures, evictions, utility bill disconnection, government programs, domestic issues and more, [employer] [facility] [place] (LANC) is a nonprofit law firm that provides free legal services and counsel to lower income people, seniors, disabled, and others, The goal is to ensure everyone has access to justice and fair representation. Call them at 866-219-5262.  -[employer] [facility] [employer] [facility] can provide info about obtaining legal assistance with evictions. Phone 336-334-3731.  Training Services  [employer] offers job and employment training. Resources are focused on helping students obtain the skills and experiences that are necessary to compete in today's challenging and tight job market. The non-profit faith-based community action agency offers internship trainings as well as classroom instruction. Classes are tailored to meet the needs of people in the [place] region. [place], [place] [address], (336) 691-5780  Foreclosure prevention/Debt Services [facility] [place] Consumer Credit Counseling Service inludes debt and foreclosure prevention programs for local families. This includes money management, financial advice, budget review and development of a written action plan with a nationally certified non-profit consumer credit counselor to help solve specific individual financial problems. In addition, housing and mortgage counselors can also provide pre- and post-purchase homeownership counseling, default resolution counseling (to prevent foreclosure) and reverse mortgage counseling. A Debt Management Program allows people and families with a high level of credit card or medical debt to consolidate and repay consumer debt and  loans to creditors and rebuild positive credit ratings and scores. Contact (336) 373-8882.  Community clinics in [place] -Health Department [facility]: [address], , [address]. (336) 641-777.  -[facility] [place] [facility]: [address], [place], [address]. (336) 641-3245.  -[place] [facility] offers medical care through a group   of doctors, pharmacies and other healthcare related agencies that offer services for low income, uninsured adults in [place]. Also offers adult Dental care and assistance with applying for an Orange Card. Call (336) 895-4900.   -[facility]. This center provides low-cost health care to those without health insurance. Services offered include an onsite pharmacy. Phone (336) 832-4444. [address], [place].  -Medication Assistance Program serves as a link between pharmaceutical companies and patients to provide low cost or free prescription medications. This service is available for residents who meet certain income restrictions and have no insurance coverage. PLEASE CALL 336-641-8030 ([place]) OR 336-641-7620 ([place])  -One Step Further: Community Offender Resource Program, The Community Support & Nutrition Program, [facility]. Call (336) 275-3699/ (336) 708-9599.  Food pantry and assistance -[facility]: [address], [place] [address]. Phone (336) 271-5959  -[facility]: [address], [place], [place] [address]. (336) 333-2266.  -[place] Food Finder: https://findfood.ghpfa.org/findfood  FLEEING VIOLENCE:  -[facility] [place]- 24/7 Crisis line (336-273-7273) -[place] Family Justice Centers: (336) 641-SAFE (7233)   [place] 2-1-1 is another useful way to locate resources in the community. Visit www.unitedwaync.org/nc211 to find service information online. If you need  additional assistance, 2-1-1 Referral Specialists are available 24 hours a day, every day by dialing 2-1-1 or 888-892-1162 from any phone. The call is free, confidential, and available in any language.  Affordable Housing Search  http://www.nchousingsearch.org  [facility] Interactive Resource Center (IRC)   M-F 8a-3p [address] [place], [place] [address] 336-332-0824 Services include: laundry, barbering, support groups, case management, phone & computer access, showers, AA/NA mtgs, mental health/substance abuse nurse, job skills class, disability information, VA assistance, spiritual classes, etc. Winter Shelter available when temperatures are less than 32 degrees.   HOMELESS SHELTERS [facility]- Call (336) 271-5959 ext. 347 or ext. 336. Located at [address] [place], [place] [address]  [facility]- Call (336) 886-4922. Located at [address], [place] [address].  [facility] Women's Shelter. Call (336) 884-1039. Office located at [address], [place] [address].  [facility] through [facility]-Call (336)271-5988.  [facility]- Call (336) 790-5051. Located at [address], [place], [place] [address].  [facility]-For Pregnant mothers. Call (336) 275-9566. Located at [address] [place], [address].  [facility]-For men in [place]. Call (336) 318-0012.  [facility] Shelter for [place]-Call (336) 612-2745. Office located at [address], [place], [address].  [facility]-Must be agreeable to help with chores. Call (919) 688-9641 ext. 5000.  Men's: [address] [place], [place] [address]. Women's: [facility]  [address] [place], [place] [address]  Crisis Services [facility] Mobile Crisis Management- [phone]  [facility]-[address], [place],  [address]. Phone:  (336) 890-2700 

## 2022-08-18 NOTE — Progress Notes (Signed)
Physical Therapy Treatment Patient Details Name: David [name] MRN: [id number] DOB: November 27, 1960 Today's Date: 08/18/2022   History of Present Illness Pt is a 62 yo male admitted [facility] to [facility] 4/9 then trans[facility] to [facility]. Pt presented to [facility] ED with complaint of fever and found to have MRSA bacteremia and multiple abscesses, bilateral pneumonia and right-sided parapneumonic effusion, T7-T9 discitis and phlegmon, also had right foot osteomylitis. 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    PT Comments    Pt greeted up in chair and agreeable to session with encouragement. Pt able to don TSLO with set up and min A to position appropriately. Pt requesting to utilize RW for gait, stating SPC felt too unstable in previous session. Pt requiring up to min guard during gait, with x1 small LOB with 180 degree turn with pt able to self correct. Pt continues to require cues throughout gait for upright posture and forward gaze as well as safety awareness, as pt stepping L foot outside RW on re-entry to room. Pt continues to benefit from skilled PT services to progress toward functional mobility goals.    Recommendations for follow up therapy are one component of a multi-disciplinary discharge planning process, led by the attending physician.  Recommendations may be updated based on patient status, additional functional criteria and insurance authorization.  Follow Up Recommendations       Assistance Recommended at Discharge Intermittent Supervision/Assistance  Patient can return home with the following A little help with walking and/or transfers;Assist for transportation   Equipment Recommendations  Rolling walker (2 wheels)    Recommendations for Other Services       Precautions / Restrictions Precautions Precautions:  Fall Required Braces or Orthoses: Spinal Brace Spinal Brace: Thoracolumbosacral orthotic;Applied in sitting position Other Brace: for use when ambulating Restrictions Weight Bearing Restrictions: Yes RLE Weight Bearing: Partial weight bearing RLE Partial Weight Bearing Percentage or Pounds: heel weight bearing Other Position/Activity Restrictions: with Darco shoe     Mobility  Bed Mobility Overal bed mobility: Needs Assistance             General bed mobility comments: pt up in chair on arrival    Transfers Overall transfer level: Needs assistance Equipment used: Rolling walker (2 wheels) Transfers: Sit to/from Stand, Bed to chair/wheelchair/BSC Sit to Stand: Supervision           General transfer comment: Pt requires increased time and cues for safety.    Ambulation/Gait Ambulation/Gait assistance: Supervision, Min guard Gait Distance (Feet): 500 Feet Assistive device: Rolling walker (2 wheels) Gait Pattern/deviations: Step-through pattern, Decreased stride length, Decreased dorsiflexion - right Gait velocity: grossly WFL     General Gait Details: improved gait speed and gait pattern, performing step through gait, but noted decreased B foot clearance especially with fatigue, cueing intermittently for heel weight bearing when fatigued, x1 small LOB when turning 180 degrees with pt able to self correct, pt declining use of SPC   Stairs             Wheelchair Mobility    Modified Rankin (Stroke Patients Only)       Balance Overall balance assessment: Needs assistance Sitting-balance support: Single extremity supported, Feet supported, Bilateral upper extremity supported, No upper extremity supported Sitting balance-Leahy Scale: Good   Postural control: Left lateral lean (Secondary to pain) Standing balance  support: Bilateral upper extremity supported, Single extremity supported, During functional activity Standing balance-Leahy Scale: Poor Standing  balance comment: Reliant on RW or grab bar during functional tasks                            Cognition Arousal/Alertness: Awake/alert Behavior During Therapy: WFL for tasks assessed/performed Overall Cognitive Status: Impaired/Different from baseline Area of Impairment: Memory, Safety/judgement, Problem solving                     Memory: Decreased recall of precautions   Safety/Judgement: Decreased awareness of safety, Decreased awareness of deficits   Problem Solving: Requires verbal cues General Comments: Pt continues to require cueing for use of Darco shoe and TLSO.        Exercises      General Comments        Pertinent Vitals/Pain Pain Assessment Pain Assessment: Faces Faces Pain Scale: Hurts even more Pain Location: back Pain Descriptors / Indicators: Grimacing, Guarding Pain Intervention(s): Premedicated before session, Monitored during session, Limited activity within patient's tolerance    Home Living                          Prior Function            PT Goals (current goals can now be found in the care plan section) Acute Rehab PT Goals Patient Stated Goal: go home PT Goal Formulation: With patient Time For Goal Achievement: 08/25/22 Progress towards PT goals: Progressing toward goals    Frequency    Min 2X/week      PT Plan      Co-evaluation              AM-PAC PT "6 Clicks" Mobility   Outcome Measure  Help needed turning from your back to your side while in a flat bed without using bedrails?: A Little Help needed moving from lying on your back to sitting on the side of a flat bed without using bedrails?: A Little Help needed moving to and from a bed to a chair (including a wheelchair)?: A Little Help needed standing up from a chair using your arms (e.g., wheelchair or bedside chair)?: A Little Help needed to walk in hospital room?: A Little Help needed climbing 3-5 steps with a railing? : A Little 6  Click Score: 18    End of Session Equipment Utilized During Treatment: Back brace Activity Tolerance: Patient tolerated treatment well Patient left: with call bell/phone within reach;in chair Nurse Communication: Mobility status PT Visit Diagnosis: Difficulty in walking, not elsewhere classified (R26.2);Other abnormalities of gait and mobility (R26.89)     Time: 4010-2725 PT Time Calculation (min) (ACUTE ONLY): 19 min  Charges:  $Gait Training: 8-22 mins                     Carrieanne Kleen R. PTA Acute Rehabilitation Services Office: [phone]    Catalina Antigua 08/18/2022, 2:44 PM

## 2022-08-18 NOTE — TOC Transition Note (Signed)
TOC discharge medications READY and LOCATED in TOC pharmacy on 2nd floor AWAITING pt discharge.  

## 2022-08-18 NOTE — Progress Notes (Signed)
This chaplain is present for F/U on creating the Pt. Advance Directive. The chaplain understands the Pt. is anticipating d/c on Friday. The Pt. has the blank AD documents and will pursue as an outpatient.  The chaplain joined PMT NP-Mary on her visit with the Pt. The chaplain understands the successful management of the Pt. pain effects quality of life for the Pt.  The chaplain listened reflectively to the Pt. describe quality of life to include a community of faith and his wife as sources of support and hope.  The Pt. accepted the chaplain's blessing and encouragement for a few deep breathes.  Chaplain Stephanie Acre [phone]

## 2022-08-19 ENCOUNTER — Encounter: Payer: Self-pay | Admitting: Physician Assistant

## 2022-08-19 ENCOUNTER — Other Ambulatory Visit (HOSPITAL_COMMUNITY): Payer: Self-pay

## 2022-08-19 ENCOUNTER — Other Ambulatory Visit: Payer: Self-pay | Admitting: Internal Medicine

## 2022-08-19 DIAGNOSIS — R5381 Other malaise: Secondary | ICD-10-CM

## 2022-08-19 LAB — GLUCOSE, CAPILLARY
Glucose-Capillary: 156 mg/dL — ABNORMAL HIGH (ref 70–99)
Glucose-Capillary: 143 mg/dL — ABNORMAL HIGH (ref 70–99)

## 2022-08-19 MED ORDER — OXYCODONE HCL ER 20 MG PO T12A
20.0000 mg | EXTENDED_RELEASE_TABLET | Freq: Two times a day (BID) | ORAL | 0 refills | Status: DC
  Filled 2022-08-19: qty 20, 10d supply, fill #0

## 2022-08-19 MED ORDER — POLYETHYLENE GLYCOL 3350 17 GM/SCOOP PO POWD
17.0000 g | Freq: Two times a day (BID) | ORAL | 0 refills | Status: DC
  Filled 2022-08-19: qty 238, 7d supply, fill #0

## 2022-08-19 MED ORDER — OXYCODONE HCL 5 MG PO TABS
5.0000 mg | ORAL_TABLET | ORAL | 0 refills | Status: DC | PRN
  Filled 2022-08-19: qty 30, 5d supply, fill #0

## 2022-08-19 MED ORDER — BUSPIRONE HCL 15 MG PO TABS
15.0000 mg | ORAL_TABLET | Freq: Two times a day (BID) | ORAL | 0 refills | Status: DC
  Filled 2022-08-19: qty 60, 30d supply, fill #0

## 2022-08-19 MED ORDER — NALOXONE HCL 4 MG/0.1ML NA LIQD
NASAL | 0 refills | Status: DC
  Filled 2022-08-19: qty 2, 14d supply, fill #0

## 2022-08-19 MED ORDER — PANTOPRAZOLE SODIUM 40 MG PO TBEC
40.0000 mg | DELAYED_RELEASE_TABLET | Freq: Every day | ORAL | 0 refills | Status: DC
  Filled 2022-08-19: qty 30, 30d supply, fill #0

## 2022-08-19 MED ORDER — FUROSEMIDE 40 MG PO TABS
20.0000 mg | ORAL_TABLET | Freq: Every day | ORAL | 0 refills | Status: DC
Start: 2022-08-19 — End: 2022-10-21
  Filled 2022-08-19: qty 15, 30d supply, fill #0

## 2022-08-19 MED ORDER — MORPHINE SULFATE ER 30 MG PO TBCR
30.0000 mg | EXTENDED_RELEASE_TABLET | Freq: Two times a day (BID) | ORAL | 0 refills | Status: DC
  Filled 2022-08-19: qty 28, 14d supply, fill #0

## 2022-08-19 MED ORDER — ATORVASTATIN CALCIUM 80 MG PO TABS
80.0000 mg | ORAL_TABLET | Freq: Every day | ORAL | 0 refills | Status: DC
Start: 2022-08-19 — End: 2022-10-21
  Filled 2022-08-19: qty 30, 30d supply, fill #0

## 2022-08-19 MED ORDER — PREGABALIN 50 MG PO CAPS
50.0000 mg | ORAL_CAPSULE | Freq: Three times a day (TID) | ORAL | 0 refills | Status: DC
  Filled 2022-08-19: qty 90, 30d supply, fill #0

## 2022-08-19 NOTE — TOC Transition Note (Signed)
Transition of Care [facility]) - CM/SW Discharge Note   Patient Details  Name: David [name] MRN: [id number] Date of Birth: 1960-09-14  Transition of Care [facility]) CM/SW Contact:  Gordy Clement, RN Phone Number: 08/19/2022, 10:08 AM   Clinical Narrative:     Patient will DC to home with Home Health PT and OT  [facility] has agreed to accept this uninsured patient. Family to transport . No additional TOC needs    Final next level of care: IP Rehab Facility Barriers to Discharge: Continued Medical Work up, Inadequate or no insurance (Needs IV abx)   Patient Goals and CMS Choice CMS Medicare.gov Compare Post Acute Care list provided to:: Patient Choice offered to / list presented to : Patient  Discharge Placement                         Discharge Plan and Services Additional resources added to the After Visit Summary for   In-house Referral: Clinical Social Work Discharge Planning Services: NA Post Acute Care Choice: Home Health          DME Arranged: IV pump/equipment DME Agency:  Julianne Rice) Date DME Agency Contacted: 07/16/22 Time DME Agency Contacted: 1430 Representative spoke with at DME Agency: Jeri Modena            Social Determinants of Health (SDOH) Interventions SDOH Screenings   Food Insecurity: No Food Insecurity (07/12/2022)  Housing: Low Risk  (07/12/2022)  Transportation Needs: No Transportation Needs (07/12/2022)  Utilities: Not At Risk (07/12/2022)  Depression (PHQ2-9): Low Risk  (11/12/2021)  Stress: Stress Concern Present (09/26/2017)  Tobacco Use: Medium Risk (08/03/2022)     Readmission Risk Interventions    09/06/2021   12:07 PM  Readmission Risk Prevention Plan  Post Dischage Appt Complete  Medication Screening Complete  Transportation Screening Complete

## 2022-08-19 NOTE — Discharge Summary (Signed)
Physician Discharge Summary  David [name] ZHY:[id number] DOB: [birth year] DOA: 07/12/2022  PCP: [name], MD  Admit date: 07/12/2022 Discharge date: 08/19/2022  Admitted From: (Home) Disposition:  (Home )  Recommendations for Outpatient Follow-up:  Follow up with PCP  Please follow up with ID clinic on scheduled appointment 09/04/2022  Home Health: (YES)   Discharge Condition: (Stable) CODE STATUS: (FULL) Diet recommendation: Heart Healthy / Carb Modified   Brief/Interim Summary:   Brief Narrative (HPI from H&P)    PMH of chronic combined CHF, chronic A-fib on Eliquis, type II DM, prior amputation of right foot, HTN.  Present to the hospital with complaints of fever.  Found to have MRSA bacteremia as well as multiple abscesses.  Originally admitted at [facility] on 4/9.  Blood cultures came positive for MRSA, his further workup suggested that patient had bilateral pneumonia and right-sided parapneumonic effusion, T7-T9 discitis and phlegmon, also had right foot infection.  He was seen by pulmonary, IR, neurosurgery and ID along with orthopedic surgery.     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   Significant procedures and events   4/10 ID was consulted. 4/12 TTE was negative, TEE was done which was negative for vegetation 4/18 nephrology consulted for AKI. 4/22 neurosurgery [facility]/ Dr Yetta Barre were consulted. Recommend medical management with IV antibiotics only without any intervention. Neurosurgery was consulted and recommend conservative measures. 4/23 Dr. [name] was consulted for amputation.  Vascular surgery was consulted for concerns for PVD. 4/24 IR placed right-sided chest tube for pleural effusion 4/25 underwent right below-knee popliteal artery, tibioperoneal trunk and peroneal angioplasty with Dr. Chestine Spore. 4/26 IR remove the  chest tube.  Follow-up chest x-ray still shows pleural effusions. 4/27 patient currently does not want to perform the procedure with Dr. [name].  Informed Dr. [name] as well as consulted Dr. Linna Caprice. 4/29: Patient agreeable to proceed with amputation surgery with orthopedic surgeon Dr. Odis Hollingshead 4/30: Patient underwent right fifth ray revision amputation. 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 2. Thin epidural phlegmon/abscess along the posterior T7 and T8 endplates. 3. New mild pathologic compression deformity of the T8 vertebral body with 3-4 mm retropulsion of the posterior endplate which combined with the phlegmon/absces results in mild spinal canal stenosis 5/16 patient  was changed to p.o. antibiotics.   Sepsis secondary to MRSA bacteremia.  Potential sources T7-T9 discitis and phlegmon, right foot infection and osteomyelitis, right-sided pneumonia and parapneumonic effusion. He had sepsis upon admission which has resolved  T7 T9 discitis.  He was seen by neurosurgery Dr. Yetta Barre who recommended medical management, patient was seen and followed closely by ID, he was treated initially with combination of IV daptomycin and doxycycline, currently he is transition to oral doxycycline at time of discharge, with close follow-up with ID, appointment scheduled for 09/04/2022.    Right foot infection and osteomyelitis.   - Patient was seen by orthopedic surgery, underwent right fifth ray amputation on 08/02/2022 by Dr. Odis Hollingshead, continue antibiotics along with supportive care.  Continue with wound care.   History of bilateral pneumonia with right-sided parapneumonic effusion.  Seen by pulmonary critical care.  Had chest tube placed by IR on 07/26/2024 and chest tube was removed on 07/28/2024.  Clinically stable from the standpoint.  He is on room air.   Acute on  chronic pain.  -Palliative  medicine assisting with pain regimen, he will be discharged on C codon 5 mg every 4 hours as needed, OxyContin 20 mg p.o. every 12 hours, Lyrica 50 mg 3 times daily, and he will be dispensed Narcan nasal spray as needed.    Constipation with narcotic bowel.  This has resolved yesterday after receiving magnesium citrate.   Flat affect with signs of depression.  Not suicidal or homicidal, according to nursing staff and in my interview patient changes his subjective complaints and treatment desires from hour to hour, seen and cleared by Psych.   Anemia of chronic disease.  Stable.   Paroxysmal atrial fibrillation.  [place] vas 2 score of greater than 3.  Currently on digoxin, is beta-blocker along with Eliquis.   Hypertension.  Resume home medications at time of discharge.    Dyslipidemia.  On statin.     Acute on chronic combined CHF EF 60%.  Now resolved and compensated, on Coreg continue.  Resume back on home dose diuretics, beta-blockers and Entresto at time of discharge as well resumed on lower dose Lasix.   Hyponatremia with AKI.  Dehydrated improved after IV fluids.    Peripheral vascular disease with critical limb ischemia.  Vascular surgery was consulted by Dr. [name] Underwent angiogram 4/25 with angioplasty of right below-knee popliteal artery, tibioperoneal trunk, and peroneal artery, Recommendation for aspirin, Plavix (>>Eliquis) and statin previous MD DW Dr. Chestine Spore 08/10/24.   DM type II.  On Lantus, Premeal NovoLog and sliding scale.  Monitor and adjust.       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)   Pure hypercholesterolemia   Hyponatremia   Hypomagnesemia   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)   Acute respiratory failure with  hypoxia (HCC)   Sepsis due to pneumonia (HCC)   Bacteremia   Infective myositis   Severe protein-calorie malnutrition (HCC)   PVD (peripheral vascular disease) (HCC)   AKI (acute kidney injury) (HCC)   Discitis thoracic region   Bacteremia due to methicillin resistant Staphylococcus aureus   Diskitis   Adjustment disorder with mixed anxiety and depressed mood   Actinomyces infection    Discharge Instructions  Discharge Instructions     Diet - low sodium heart healthy   Complete by: As directed    Discharge instructions   Complete by: As directed    Follow with Primary MD [name], MD in 7 days   Get CBC, CMP,  checked  by Primary MD next visit.    Activity: As tolerated with Full fall precautions use walker/cane & assistance as needed   Disposition Home    Diet: Heart Healthy/carb modified  For Heart failure patients - Check your Weight same time everyday, if you gain over 2 pounds, or you develop in leg swelling, experience more shortness of breath or chest pain, call your Primary MD immediately. Follow Cardiac Low Salt Diet and 1.5 lit/day fluid restriction.   On your next visit with your primary care physician please Get Medicines reviewed and adjusted.   Please request your Prim.MD to go over all Hospital Tests and Procedure/Radiological results at the follow up, please get all Hospital records sent to your Prim MD by signing hospital release before you go home.  If you experience worsening of your admission symptoms, develop shortness of breath, life threatening emergency, suicidal or homicidal thoughts you must seek medical attention immediately by calling 911 or calling your MD immediately  if symptoms less severe.  You Must read complete instructions/literature along with all the possible adverse reactions/side effects for all the Medicines you take and that have been prescribed to you. Take any new Medicines after you have completely understood and  accpet all the possible adverse reactions/side effects.   Do not drive, operating heavy machinery, perform activities at heights, swimming or participation in water activities or provide baby sitting services if your were admitted for syncope or siezures until you have seen by Primary MD or a Neurologist and advised to do so again.  Do not drive when taking Pain medications.    Do not take more than prescribed Pain, Sleep and Anxiety Medications  Special Instructions: If you have smoked or chewed Tobacco  in the last 2 yrs please stop smoking, stop any regular Alcohol  and or any Recreational drug use.  Wear Seat belts while driving.   Please note  You were cared for by a hospitalist during your hospital stay. If you have any questions about your discharge medications or the care you received while you were in the hospital after you are discharged, you can call the unit and asked to speak with the hospitalist on call if the hospitalist that took care of you is not available. Once you are discharged, your primary care physician will handle any further medical issues. Please note that NO REFILLS for any discharge medications will be authorized once you are discharged, as it is imperative that you return to your primary care physician (or establish a relationship with a primary care physician if you do not have one) for your aftercare needs so that they can reassess your need for medications and monitor your lab values.   Discharge wound care:   Complete by: As directed    Wound care to right foot, 4th digit amputation site:  Cleanse with NS, oat dry. Cover with single layer of xeroform gauze [place] # [id number]), top with dry gauze and secure with Kerlix roll gauze, paper tape. Float heels.   Increase activity slowly   Complete by: As directed       Allergies as of 08/19/2022   No Known Allergies      Medication List     STOP taking these medications    clopidogrel 75 MG tablet Commonly  known as: PLAVIX   HYDROcodone-acetaminophen 5-325 MG tablet Commonly known as: NORCO/VICODIN   spironolactone 25 MG tablet Commonly known as: ALDACTONE       TAKE these medications    apixaban 5 MG Tabs tablet Commonly known as: ELIQUIS Take 1 tablet (5 mg total) by mouth 2 (two) times daily.   aspirin EC 81 MG tablet Take 1 tablet (81 mg total) by mouth daily. Swallow whole.   atorvastatin 80 MG tablet Commonly known as: LIPITOR Take 1 tablet (80 mg total) by mouth daily.   Basaglar KwikPen 100 UNIT/ML Inject 40 Units into the skin daily.   busPIRone 15 MG tablet Commonly known as: BUSPAR Take 1 tablet (15 mg total) by mouth 2 (two) times daily. What changed:  medication strength how much to take   carvedilol 6.25 MG tablet Commonly known as: COREG Take 1 tablet (6.25 mg total) by mouth 2 (two) times daily.   digoxin 0.125 MG tablet Commonly known as: LANOXIN  Take 1 tablet (125 mcg total) by mouth once daily.   doxycycline 100 MG tablet Commonly known as: VIBRA-TABS Take 1 tablet (100 mg total) by mouth 2 (two) times daily.   Entresto 49-51 MG Generic drug: sacubitril-valsartan Take 1 tablet by mouth 2 (two) times daily. Needs to see cardiology for refills What changed: additional instructions   famotidine 20 MG tablet Commonly known as: PEPCID Take 1 tablet (20 mg total) by mouth 2 times daily.   fluticasone 50 MCG/ACT nasal spray Commonly known as: FLONASE Place 2 sprays into both nostrils daily.   furosemide 40 MG tablet Commonly known as: LASIX Take 0.5 tablets (20 mg total) by mouth daily. What changed: how much to take   HumaLOG KwikPen 100 UNIT/ML KwikPen Generic drug: insulin lispro Inject 15 Units into the skin 3 (three) times daily.   naloxone 4 MG/0.1ML Liqd nasal spray kit Commonly known as: NARCAN Opiates overdose   oxyCODONE 5 MG immediate release tablet Commonly known as: Oxy IR/ROXICODONE Take 1 tablet (5 mg total) by mouth  every 4 (four) hours as needed for moderate pain.   oxyCODONE 20 mg 12 hr tablet Commonly known as: OXYCONTIN Take 1 tablet (20 mg total) by mouth every 12 (twelve) hours.   pantoprazole 40 MG tablet Commonly known as: Protonix Take 1 tablet (40 mg total) by mouth daily.   polyethylene glycol 17 g packet Commonly known as: MIRALAX / GLYCOLAX Take 17 g by mouth 2 (two) times daily.   pregabalin 50 MG capsule Commonly known as: LYRICA Take 1 capsule (50 mg total) by mouth 3 (three) times daily.               Discharge Care Instructions  (From admission, onward)           Start     Ordered   08/19/22 0000  Discharge wound care:       Comments: Wound care to right foot, 4th digit amputation site:  Cleanse with NS, oat dry. Cover with single layer of xeroform gauze [place] # [id number]), top with dry gauze and secure with Kerlix roll gauze, paper tape. Float heels.   08/19/22 1610            Follow-up Information     Sharlene Dory, PA-C Follow up.   Specialty: Cardiology Why: [facility] - [address] location - cardiology followup has been rescheduled to Thursday Aug 18, 2022 at 10:55 AM (Arrive by 10:40 AM). Contact information: [address] [place] [address] [phone]         [facility] Gamb[facility] Follow up.   Why: [facility] will provide PT and OT and will contact you within 48 hours of dc to schedule initial visit Contact information: [address] [place] [address] [phone]         Daiva Eves, Lisette Grinder, MD Follow up on 09/08/2022.   Specialty: Infectious Diseases Why: has an appointment on September 08, 2022 at 2pm with Dr. Daiva Eves  at   [facility] for Infectious Disease, which  is located in the [facility] at   [address] in [place].   [address], which is located to the left of the elevators.   Phone: [phone]   Fax: [phone]    [email or web address] Contact information: [address] [place] [address] [phone]  No Known Allergies  Consultations: ID, orthopedics, neurosurgery, psychiatry, palliative care    Procedures/Studies: DG Chest Port 1 View  Result Date: 08/16/2022 CLINICAL DATA:  Shortness of breath EXAM: PORTABLE CHEST 1 VIEW COMPARISON:  08/11/2022 and prior studies FINDINGS: Cardiomegaly again noted. RIGHT mid and lower lung opacities and RIGHT pleural effusion are unchanged. There is no evidence of pneumothorax. IMPRESSION: Unchanged appearance of the chest with RIGHT mid and lower lung opacities and RIGHT pleural effusion. Electronically Signed   By: Harmon Pier M.D.   On: 08/16/2022 08:36   DG Abd Portable 1V  Result Date: 08/13/2022 CLINICAL DATA:  Shortness of breath and constipation. EXAM: PORTABLE ABDOMEN - 1 VIEW COMPARISON:  CT 07/12/2022 FINDINGS: The bowel gas pattern appears nonobstructed. Gas and stool is noted throughout the colon and rectum. No pathologic dilatation of the large or small bowel loops noted. IMPRESSION: Nonobstructive bowel gas pattern. Electronically Signed   By: Signa Kell M.D.   On: 08/13/2022 08:27   US RENAL  Result Date: 08/12/2022 CLINICAL DATA:  Acute kidney injury EXAM: RENAL / URINARY TRACT ULTRASOUND COMPLETE COMPARISON:  CT abdomen pelvis with contrast 07/12/2022 FINDINGS: Right Kidney: Renal measurements: 11.2 x 6.0 x 6.2 cm = volume: 179 mL. Echogenicity within normal limits. No mass or hydronephrosis visualized. Left Kidney: Renal measurements: 13.0 x 6.1 x 5.1 cm = volume: 209 mL. Echogenicity within normal limits. No mass or hydronephrosis visualized. Bladder: Appears normal for degree of bladder distention. Other: None. IMPRESSION: No significant sonographic abnormality of the kidneys. Electronically Signed   By: Acquanetta Belling M.D.   On: 08/12/2022 15:00   DG Chest Port 1 View  Result Date:  08/11/2022 CLINICAL DATA:  Shortness of breath. EXAM: PORTABLE CHEST 1 VIEW COMPARISON:  07/31/2022 FINDINGS: Stable cardiac enlargement. Pulmonary vascular congestion. Unchanged appearance of right pleural effusion with right mid and right lower lung opacities. Left lung appears clear. IMPRESSION: 1. Persistent right pleural effusion and right mid and right lower lung opacities. 2. Cardiac enlargement and pulmonary vascular congestion. Electronically Signed   By: Signa Kell M.D.   On: 08/11/2022 06:50   MR THORACIC SPINE WO CONTRAST  Result Date: 08/08/2022 CLINICAL DATA:  Osteomyelitis. EXAM: MRI THORACIC SPINE WITHOUT CONTRAST TECHNIQUE: Multiplanar, multisequence MR imaging of the thoracic spine was performed. No intravenous contrast was administered. COMPARISON:  Thoracic spine MRI 07/25/2022. FINDINGS: Alignment: There is mild exaggeration of the normal thoracic lordosis. There is no significant antero or retrolisthesis. Vertebrae: Background marrow signal is normal There is confluent T1 hypointensity and STIR hyperintensity in the T7 through T9 vertebral bodies with relative sparing of the intervening T7-T8 disc space concerning for persistent discitis/osteomyelitis, progressed involving the T7 vertebral body compared to the prior study. There is thin T2 hyperintensity along the ventral aspect of the thecal sac at T7-T8 likely reflecting phlegmon and possible abscess. Additionally, there is mild compression deformity of the T8 vertebral body with 3-4 mm retropulsion of the posterior endplate which is new since the prior study consistent with interval pathologic fracture. The other thoracic vertebral body heights are preserved. A benign intraosseous hemangioma in the T12 vertebral body is unchanged. Compression deformity of the L4 vertebral body seen on the sagittal count sequence is unchanged compared to the prior lumbar spine MRI. Cord:  Normal in signal and morphology. Paraspinal and other soft  tissues: There is complex right pleural effusion, incompletely imaged but at least somewhat decreased in size compared to the prior MRI. There is smaller layering left  pleural effusion. A lobulated fluid collection in the paraspinal soft tissues at T7-T9 is again seen, overall decreased in size compared to the MRI. On the current study, the right aspect of the collection measures up to approximately 4.4 cm AP x 4.6 cm cc in the collection in total measures up to 5.2 cm TV. Disc levels: There is mild spinal canal stenosis at T8 due to the bony retropulsion and epidural phlegmon. There is overall mild background degenerative change without other significant spinal canal or neural foraminal stenosis. IMPRESSION: 1. 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 2. Thin epidural phlegmon/abscess along the posterior T7 and T8 endplates. 3. New mild pathologic compression deformity of the T8 vertebral body with 3-4 mm retropulsion of the posterior endplate which combined with the phlegmon/absces results in mild spinal canal stenosis. 4. Interval decrease in size of the lobulated fluid collection in the paraspinal soft tissues at T7-T9. 5. Complex right pleural effusion, incompletely imaged but at least somewhat decreased in size compared to the prior MRI. Electronically Signed   By: Lesia Hausen M.D.   On: 08/08/2022 15:54   DG MINI C-ARM IMAGE ONLY  Result Date: 08/02/2022 There is no interpretation for this exam.  This order is for images obtained during a surgical procedure.  Please See "Surgeries" Tab for more information regarding the procedure.   DG Chest 2 View  Result Date: 07/31/2022 CLINICAL DATA:  BILATERAL pleural effusions EXAM: CHEST - 2 VIEW COMPARISON:  07/29/2022 FINDINGS: Enlargement of cardiac silhouette with pulmonary vascular congestion. RIGHT pleural effusion with infiltrates  of the mid to lower RIGHT lung, could represent pneumonia or asymmetric edema. Minimal atelectasis or infiltrate LEFT base with small calcified granulomata. No pneumothorax. Bones demineralized. IMPRESSION: RIGHT pleural effusion with asymmetric RIGHT lung infiltrate question asymmetric pulmonary edema versus pneumonia. Minimal atelectasis versus infiltrate LEFT base. Electronically Signed   By: Ulyses Southward M.D.   On: 07/31/2022 15:30   DG Chest Port 1 View  Result Date: 07/29/2022 CLINICAL DATA:  Chest tube removal EXAM: PORTABLE CHEST 1 VIEW COMPARISON:  07/29/2022 at 9:17 a.m. FINDINGS: The right pleural drainage catheters been removed. Stable blunting of the right lateral costophrenic angle and stable right pleural thickening attributed to loculated pleural effusions. Associated passive atelectasis and hazy density in the right mid and lower lung. Increased obscuration of left hemidiaphragm suggesting atelectasis in the left lower lobe. A small left pleural effusion could be contributory. IMPRESSION: 1. Interval removal of the right pleural drainage catheter, without pneumothorax. 2. Stable loculated right pleural effusion with passive atelectasis and hazy density in the right mid and lower lung. 3. Increased obscuration of the left hemidiaphragm suggesting atelectasis in the left lower lobe. Small left pleural effusion could be contributory. Electronically Signed   By: Gaylyn Rong M.D.   On: 07/29/2022 18:11   DG CHEST PORT 1 VIEW  Result Date: 07/29/2022 CLINICAL DATA:  Chest tube in place. EXAM: PORTABLE CHEST 1 VIEW COMPARISON:  Radiographs 07/24/2022 and 07/20/2022.  CT 07/12/2022. FINDINGS: 0918 hours. Interval placement of a small caliber pigtail catheter laterally in the right pleural space. The right pleural effusion has decreased in volume. The right arm PICC has been removed in the interval. There is mildly improved aeration of the right lung base. No evidence of pneumothorax. The left  lung is clear. The heart size and mediastinal contours are stable with cardiomegaly and aortic atherosclerosis. IMPRESSION:  1. Decreased right pleural effusion following chest tube placement. No evidence of pneumothorax. 2. Mildly improved aeration of the right lung base. Electronically Signed   By: Carey Bullocks M.D.   On: 07/29/2022 11:37   PERIPHERAL VASCULAR CATHETERIZATION  Result Date: 07/28/2022 Images from the original result were not included.   Patient name: Cali Golson     MRN: [id number]        DOB: [birth year]            Sex: male  07/28/2022 Pre-operative Diagnosis: Critical limb ischemia of the right lower extremity with osteomyelitis of right fifth metatarsal Post-operative diagnosis:  Same Surgeon:  Cephus Shelling, MD Procedure Performed: 1.  Ultrasound-guided access left common femoral artery 2.  Aortogram with catheter selection of aorta 3.  Right lower extremity arteriogram with selection of third order branches 4.  Catheter selection of right SFA 5.  Right below-knee popliteal artery, tibioperoneal trunk, and peroneal angioplasty (3 mm x 100 mm Sterling) 6.  Mynx closure of the left common femoral artery 7.  49 minutes of monitored moderate conscious sedation time  Indications: Patient is a 62 year old male that has previously undergone tibial intervention with Dr. Myra Gianotti.  He now has developed osteomyelitis of his right fifth metatarsal.  Presents for lower extremity arteriogram with possible intervention with a focus on the right leg after risks and benefits discussed.  Findings:  Aortogram showed no flow-limiting stenosis in the aortoiliac segment.  The left renal and right renal artery/accessory right renal were all patent.  The visualized portion of the SMA was patent.  Right lower extremity arteriogram showed a patent common femoral and profunda.  The SFA has some disease but no flow-limiting stenosis.  Above-knee popliteal artery is widely patent.  The below-knee popliteal artery  had a focal 74% stenosis by measurement.  The TP trunk had a 95% stenosis into the proximal peroneal by measurement.  There was two-vessel runoff in the peroneal and posterior tibial artery.  Ultimately the below-knee popliteal artery, tibioperoneal trunk and proximal peroneal artery were treated with a 3 mm Sterling for 2 minutes.  Excellent results.  Widely patent vessel.  No significant residual stenosis and preserved two-vessel runoff.             Procedure:  The patient was identified in the holding area and taken to room 8.  The patient was then placed supine on the table and prepped and draped in the usual sterile fashion.  A time out was called.  Patient received Versed and fentanyl for conscious moderate sedation.  Vital signs were monitored including heart rate, respiratory rate, oxygenation and blood pressure.  I was present for all of moderate sedation.  Ultrasound was used to evaluate the left common femoral artery.  It was patent .  A digital ultrasound image was acquired.  A micropuncture needle was used to access the left common femoral artery under ultrasound guidance.  An 018 wire was advanced without resistance and a micropuncture sheath was placed.  The 018 wire was removed and a benson wire was placed.  The micropuncture sheath was exchanged for a 5 french sheath.  An omniflush catheter was advanced over the wire to the level of L-1.  An abdominal angiogram was obtained.  Next, using the omniflush catheter and a benson wire, the aortic bifurcation was crossed and the catheter was placed into theright external iliac artery and right runoff was obtained.  Ultimately after evaluating images elected for intervention.  I used a  Bentson wire down the right SFA and exchanged for a long 5 [place] Catapult sheath over the aortic bifurcation.  Patient was given 100 units/kg IV heparin.  I performed hand injections from a sheath in the distal SFA on the right to locate the lesions.  I then used a V18 wire  and I was able to get down through the below-knee popliteal stenosis through the TP trunk stenosis into the peroneal artery.  I then elected to treat this with a long 3 mm Sterling to nominal pressure for 2 minutes.  Hand-injection after treatment showed widely patent below-knee popliteal tibioperoneal trunk and two-vessel runoff.  There was no significant residual stenosis.  Wires and catheters were removed.  Short 5 French sheath in the left groin.  Mynx closure device deployed.  Plan: Good results after angioplasty today.  He has two-vessel runoff in the peroneal and posterior tibial.  Aspirin Plavix statin.  Cephus Shelling, MD [facility] Office: [phone]   CT [facility] PLEURAL DRAIN W/INDWELL CATH W/IMG GUIDE  Result Date: 07/28/2022 INDICATION: Loculated right-sided pleural effusion. Please perform image guided right-sided chest tube placement for infection source control purposes. EXAM: ULTRASOUND AND CT-GUIDED RIGHT-SIDED CHEST TUBE PLACEMENT COMPARISON:  Thoracic spine MRI-07/05/2022 Chest CT-07/12/2022 MEDICATIONS: The patient is currently admitted to the hospital and receiving intravenous antibiotics. The antibiotics were administered within an appropriate time frame prior to the initiation of the procedure. ANESTHESIA/SEDATION: Moderate (conscious) sedation was employed during this procedure. A total of 100 mcg Fentanyl was administered intravenously. Moderate Sedation Time: 25 minutes. The patient's level of consciousness and vital signs were monitored continuously by radiology nursing throughout the procedure under my direct supervision. CONTRAST:  None COMPLICATIONS: None immediate. PROCEDURE: RADIATION DOSE REDUCTION: This exam was performed according to the departmental dose-optimization program which includes automated exposure control, adjustment of the mA and/or kV according to patient size and/or use of iterative reconstruction technique. Informed written  consent was obtained from the patient after a discussion of the risks, benefits and alternatives to treatment. The patient was placed supine on the CT gantry and a pre procedural CT was performed re-demonstrating the known partially loculated small to moderate-sized right-sided pleural effusion. CT gantry table position was marked and the complex fluid collection was identified sonographically. The procedure was planned. A timeout was performed prior to the initiation of the procedure. The inferolateral aspect of the right chest was prepped and draped in the usual sterile fashion. The overlying soft tissues were anesthetized with 1% lidocaine with epinephrine. Under direct ultrasound guidance, an 18 gauge trocar needle was advanced into the complex right-sided pleural effusion and a short Amplatz super stiff wire was coiled within the collection. Appropriate positioning was confirmed with a limited CT scan (series 3). The tract was serially dilated allowing placement of a 12 [place] all-purpose drainage catheter. Appropriate positioning was confirmed with a limited postprocedural CT scan (series 4). Next, approximately 200 cc blood-tinged fluid was aspirated. The tube was connected to a pleura vac device and sutured in place. A dressing was applied. The patient tolerated the procedure well without immediate post procedural complication. IMPRESSION: Successful CT guided placement of a 9 French all purpose drain catheter into the complex right-sided pleural effusion with aspiration of 12 mL of blood-tinged fluid. Samples were sent to the laboratory as requested by the ordering clinical team. Electronically Signed   By: Simonne Come M.D.   On: 07/28/2022 15:11   VAS [place] ABI WITH/WO TBI  Result Date:  07/26/2022  LOWER EXTREMITY DOPPLER STUDY Patient Name:  GERRITT BIGNELL  Date of Exam:   07/26/2022 Medical Rec #: [id number]    Accession #:    [id number] Date of Birth: Mar 17, 1961     Patient Gender: M Patient Age:   62 years  Exam Location:  [facility] Procedure:      VAS [place] ABI WITH/WO TBI Referring Phys: MARCUS DUDA --------------------------------------------------------------------------------  Indications: Gangrene, and peripheral artery disease. High Risk Factors: Hypertension, Diabetes.  Vascular Interventions: 03/20/2022 - Right RAY RESECTION FOOT. Limitations: Today's exam was limited due to bandages , involuntary patient              movement, an open wound and Right restricted arm. Comparison Study: 01/05/2022 - Right: Resting right ankle-brachial index                   indicates mild right lower                   extremity arterial disease. The right toe-brachial index is                   abnormal.                    Left: Resting left ankle-brachial index indicates moderate                   left lower                   extremity arterial disease. The left toe-brachial index is                   abnormal. Performing Technologist: Olen Cordial RVT  Examination Guidelines: A complete evaluation includes at minimum, Doppler waveform signals and systolic blood pressure reading at the level of bilateral brachial, anterior tibial, and posterior tibial arteries, when vessel segments are accessible. Bilateral testing is considered an integral part of a complete examination. Photoelectric Plethysmograph (PPG) waveforms and toe systolic pressure readings are included as required and additional duplex testing as needed. Limited examinations for reoccurring indications may be performed as noted.  ABI Findings: +---------+------------------+-----+----------+--------------+ Right    Rt Pressure (mmHg)IndexWaveform  Comment        +---------+------------------+-----+----------+--------------+ Brachial                                  Restricted arm +---------+------------------+-----+----------+--------------+ PTA      113               0.90 monophasic                +---------+------------------+-----+----------+--------------+ DP       113               0.90 monophasic               +---------+------------------+-----+----------+--------------+ Great Toe                       Absent                   +---------+------------------+-----+----------+--------------+ +---------+------------------+-----+-----------+-------+ Left     Lt Pressure (mmHg)IndexWaveform   Comment +---------+------------------+-----+-----------+-------+ Brachial 125                    triphasic          +---------+------------------+-----+-----------+-------+ PTA  122               0.98 multiphasic        +---------+------------------+-----+-----------+-------+ DP       114               0.91 multiphasic        +---------+------------------+-----+-----------+-------+ Great Toe                       Absent             +---------+------------------+-----+-----------+-------+  Summary: Right: Resting right ankle-brachial index indicates mild right lower extremity arterial disease. Unable to obtain TBI due to inconsistent/absent waveforms. Left: Resting left ankle-brachial index is within normal range. Unable to obtain TBI due to inconsistent/absent waveforms. *See table(s) above for measurements and observations.  Electronically signed by Coral Else MD on 07/26/2022 at 7:27:39 PM.    Final    MR THORACIC SPINE W WO CONTRAST  Result Date: 07/25/2022 CLINICAL DATA:  Follow-up MR for MRSA bacteremia and concern for seeding EXAM: MRI THORACIC AND LUMBAR SPINE WITHOUT AND WITH CONTRAST TECHNIQUE: Multiplanar and multiecho pulse sequences of the thoracic and lumbar spine were obtained without and with intravenous contrast. CONTRAST:  8mL GADAVIST GADOBUTROL 1 MMOL/ML IV SOLN COMPARISON:  07/13/2022 MRI thoracic and lumbar spine FINDINGS: MRI THORACIC SPINE FINDINGS Alignment: Mild scoliosis and exaggeration of the normal thoracic kyphosis. No significant  listhesis. Vertebrae: Decreased T1 signal, increased T2 signal, and contrast enhancement throughout the T8 and T11 vertebral bodies, with increased fluid signal and enhancement in the T8-T9 disc space (series 21, image 12 and series 25, image 12), which is new from the prior exam. No acute fracture or suspicious osseous lesion. Cord: T1 hypointense, contrast enhancing material along the anterior aspect of the thecal sac posterior to T8 and T9 (series 23 and 26, images 24-27), most likely phlegmon. There is likely phlegmon along the right aspect of the thecal sac posterior to T9 (series 25, image 11), although this area is not covered well on the axial sequences. This does not cause significant mass effect on the thecal sac. Prominent epidural fat also does not cause significant mass effect on the thecal sac. No abnormal spinal cord enhancement. Paraspinal and other soft tissues: Anterior to the T7-T9 vertebral bodies, there is a loculated, peripherally enhancing fluid collection, which measures up to 3.1 x 5.9 x 5.9 cm (AP x TR x CC) (series 26, image 23 and 24; series 25, image 5), concerning for multiloculated prevertebral abscess. In addition, there is a large right pleural effusion with a thick enhancing rim, concerning for empyema (series 26, image 23). Disc levels: Mild degenerative changes, without significant spinal canal stenosis or neural foraminal narrowing. MRI LUMBAR SPINE FINDINGS Segmentation:  5 lumbar type vertebral bodies. Alignment:  Mild levoscoliosis.  No significant listhesis. Vertebrae: No acute fracture, evidence of discitis, suspicious osseous lesion, or abnormal osseous enhancement. Conus medullaris: Extends to the L1 level and appears normal. No evidence of epidural collection. No abnormal enhancement. Paraspinal and other soft tissues: No acute finding. Redemonstrated increased T2 signal in the paraspinous musculature (series 2, images 3 and 9), without abnormal in his is no focal fluid  collection in the paraspinous muscles. Disc levels: Degenerative changes are stable from the 07/13/2022 exam. Please see that report for detailed findings. IMPRESSION: 1. Findings concerning for discitis osteomyelitis at T8-T9, with a loculated, peripherally enhancing fluid collection anterior to the T7-T9 vertebral bodies, concerning for a multiloculated  prevertebral abscess. 2. Enhancing material along the anterior aspect of the thecal sac posterior to T8 and T9, and likely along the right aspect of the thecal sac at T9, concerning for epidural phlegmon, which does not cause significant mass effect on the thecal sac. 3. Large right pleural effusion with a thick enhancing rim, concerning for empyema. 4. No evidence of discitis or osteomyelitis in the lumbar spine. These results will be called to the ordering clinician or representative by the Radiologist Assistant, and communication documented in the PACS or [employer]. Electronically Signed   By: Wiliam Ke M.D.   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 CONTRAST:  8mL GADAVIST GADOBUTROL 1 MMOL/ML IV SOLN COMPARISON:  07/13/2022 MRI thoracic and lumbar spine FINDINGS: MRI THORACIC SPINE FINDINGS Alignment: Mild scoliosis and exaggeration of the normal thoracic kyphosis. No significant listhesis. Vertebrae: Decreased T1 signal, increased T2 signal, and contrast enhancement throughout the T8 and T11 vertebral bodies, with increased fluid signal and enhancement in the T8-T9 disc space (series 21, image 12 and series 25, image 12), which is new from the prior exam. No acute fracture or suspicious osseous lesion. Cord: T1 hypointense, contrast  enhancing material along the anterior aspect of the thecal sac posterior to T8 and T9 (series 23 and 26, images 24-27), most likely phlegmon. There is likely phlegmon along the right aspect of the thecal sac posterior to T9 (series 25, image 11), although this area is not covered well on the axial sequences. This does not cause significant mass effect on the thecal sac. Prominent epidural fat also does not cause significant mass effect on the thecal sac. No abnormal spinal cord enhancement. Paraspinal and other soft tissues: Anterior to the T7-T9 vertebral bodies, there is a loculated, peripherally enhancing fluid collection, which measures up to 3.1 x 5.9 x 5.9 cm (AP x TR x CC) (series 26, image 23 and 24; series 25, image 5), concerning for multiloculated prevertebral abscess. In addition, there is a large right pleural effusion with a thick enhancing rim, concerning for empyema (series 26, image 23). Disc levels: Mild degenerative changes, without significant spinal canal stenosis or neural foraminal narrowing. MRI LUMBAR SPINE FINDINGS Segmentation:  5 lumbar type vertebral bodies. Alignment:  Mild levoscoliosis.  No significant listhesis. Vertebrae: No acute fracture, evidence of discitis, suspicious osseous lesion, or abnormal osseous enhancement. Conus medullaris: Extends to the L1 level and appears normal. No evidence of epidural collection. No abnormal enhancement. Paraspinal and other soft tissues: No acute finding. Redemonstrated increased T2 signal in the paraspinous musculature (series 2, images 3 and 9), without abnormal in his is no focal fluid collection in the paraspinous muscles. Disc levels: Degenerative changes are stable from the 07/13/2022 exam. Please see that report for detailed findings. IMPRESSION: 1. Findings concerning for discitis osteomyelitis at T8-T9, with a loculated, peripherally enhancing fluid collection anterior to the T7-T9 vertebral bodies, concerning for a multiloculated  prevertebral abscess. 2. Enhancing material along the anterior aspect of the thecal sac posterior to T8 and T9, and likely along the right aspect of the thecal sac at T9, concerning for epidural phlegmon, which does not cause significant mass effect on the thecal sac. 3. Large right pleural effusion with  a thick enhancing rim, concerning for empyema. 4. No evidence of discitis or osteomyelitis in the lumbar spine. These results will be called to the ordering clinician or representative by the Radiologist Assistant, and communication documented in the PACS or [employer]. Electronically Signed   By: Wiliam Ke M.D.   On: 07/25/2022 16:55   MR FOOT RIGHT W WO CONTRAST  Result Date: 07/24/2022 CLINICAL DATA:  Diabetic foot wound EXAM: MRI OF THE RIGHT FOREFOOT WITHOUT AND WITH CONTRAST TECHNIQUE: Multiplanar, multisequence MR imaging of the right forefoot was performed before and after the administration of intravenous contrast. CONTRAST:  8mL GADAVIST GADOBUTROL 1 MMOL/ML IV SOLN COMPARISON:  X-ray 07/13/2022 FINDINGS: Bones/Joint/Cartilage Postsurgical changes from fifth ray resection at the level of the distal fifth metatarsal diaphysis. There is intense bone marrow edema and enhancement of the residual fifth metatarsal diaphysis extending to the level of the proximal metaphysis (series 6, image 15). Confluent low T1 signal changes near the resection margin are compatible with acute osteomyelitis. Prior fourth ray resection at the level of the distal fourth metatarsal diaphysis without evidence of osteomyelitis. Remaining osseous structures are intact. No additional sites of osteomyelitis. No fracture or dislocation. Ligaments Intact Lisfranc ligament. Remaining collateral ligaments are intact. Muscles and Tendons Amputation changes to the musculotendinous structures at the lateral forefoot. Diffuse edema-like intramuscular signal which may be related to denervation or myositis. No tenosynovitis. Soft  tissues Soft tissue wound or ulceration at the lateral aspect of the forefoot near the residual fifth metatarsal with enhancing sinus tract extending to the underlying resection margin (series 9, images 10-13). Subcutaneous edema of the dorsal forefoot. No organized or drainable fluid collections. IMPRESSION: 1. Postsurgical changes from fifth ray resection to the level of the distal fifth metatarsal diaphysis. 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 2. Soft tissue wound or ulceration at the lateral aspect of the forefoot near the residual fifth metatarsal with enhancing sinus tract extending to the underlying resection margin. 3. Prior fourth ray resection at the level of the distal fourth metatarsal diaphysis without evidence of osteomyelitis. 4. Diffuse edema-like intramuscular signal which may be related to denervation or myositis. Electronically Signed   By: Duanne Guess D.O.   On: 07/24/2022 15:58   DG Chest 2 View  Result Date: 07/24/2022 CLINICAL DATA:  Follow-up. Worsening dyspnea. Mild productive cough EXAM: CHEST - 2 VIEW COMPARISON:  07/20/2022 x-ray FINDINGS: Stable right-sided PICC. Persistent moderate right effusion and adjacent opacities. Increasing opacity left lung base. No pneumothorax. Stable cardiopericardial silhouette. Increasing vascular congestion. Tortuous aorta. Dilated stomach with air-fluid level seen beneath the left hemidiaphragm. Please correlate with any symptoms IMPRESSION: Increasing pleural effusion on the right and bilateral lung base opacities. Increasing vascular congestion Dilated stomach with an air-fluid level beneath the left hemidiaphragm. Please correlate with any symptoms and additional workup as clinically appropriate Electronically Signed   By: Karen Kays M.D.   On: 07/24/2022 15:36   DG CHEST PORT 1 VIEW  Result Date: 07/20/2022 CLINICAL DATA:  Shortness of breath EXAM: PORTABLE  CHEST 1 VIEW COMPARISON:  07/16/2022 FINDINGS: Right PICC is again noted in satisfactory position. Cardiac shadow is enlarged but stable accentuated by the portable technique. Aortic calcifications are noted. Left lung remains clear. Increasing right-sided infiltrate and effusion are noted. No bony abnormality is noted. IMPRESSION: Increasing right basilar infiltrate with associated effusion. Electronically Signed   By: Alcide Clever M.D.   On: 07/20/2022 12:35   (  Echo, Carotid, EGD, Colonoscopy, ERCP)    Subjective:  Patient reports abdominal pain when he woke up from sleep, but this has improved with activity.  Discharge Exam: Vitals:   08/19/22 0414 08/19/22 0903  BP: (!) 145/87 106/75  Pulse: 80 80  Resp:    Temp: 98.3 F (36.8 C) (!) 97.4 F (36.3 C)  SpO2: 98% 96%   Vitals:   08/18/22 2115 08/19/22 0016 08/19/22 0414 08/19/22 0903  BP: 135/89 (!) 150/89 (!) 145/87 106/75  Pulse: 77 75 80 80  Resp:  18    Temp: 98 F (36.7 C) 98.1 F (36.7 C) 98.3 F (36.8 C) (!) 97.4 F (36.3 C)  TempSrc: Oral Oral Oral   SpO2: 96% 96% 98% 96%  Weight:      Height:        General: Pt is alert, awake, appears anxious  Cardiovascular: RRR, S1/S2 +, no rubs, no gallops Respiratory: CTA bilaterally, no wheezing, no rhonchi Abdominal: Soft, NT, ND, bowel sounds + Extremities: no edema, no cyanosis, right foot bandaged    The results of significant diagnostics from this hospitalization (including imaging, microbiology, ancillary and laboratory) are listed below for reference.     Microbiology: No results found for this or any previous visit (from the past 240 hour(s)).   Labs: BNP (last 3 results) Recent Labs    08/13/22 0253 08/14/22 0341 08/15/22 0400  BNP 477.1* 335.1* 193.9*   Basic Metabolic Panel: Recent Labs  Lab 08/13/22 0253 08/14/22 0341 08/15/22 0400 08/16/22 0628 08/17/22 0733 08/17/22 0739 08/18/22 0343  NA 133* 130* 128*  --  130*  --   --   K 3.9 3.8  4.6  --  4.4  --   --   CL 99 101 98  --  100  --   --   CO2 24 23 22   --  21*  --   --   GLUCOSE 113* 100* 170*  --  118*  --   --   BUN 14 12 15   --  15  --   --   CREATININE 1.31* 1.32* 1.52*  --  1.33*  --   --   CALCIUM 9.8 9.4 9.4  --  9.5  --   --   MG 1.9 1.9 1.9 1.8  --  1.8 1.8  PHOS 3.0 4.2 4.2 3.7  --  3.6 3.1   Liver Function Tests: Recent Labs  Lab 08/13/22 0253 08/14/22 0341 08/15/22 0400  AST 18 19 16   ALT 12 15 16   ALKPHOS 102 93 98  BILITOT 0.6 0.8 1.0  PROT 7.6 7.1 7.7  ALBUMIN 2.2* 2.1* 2.3*   No results for input(s): "LIPASE", "AMYLASE" in the last 168 hours. No results for input(s): "AMMONIA" in the last 168 hours. CBC: Recent Labs  Lab 08/14/22 0341 08/15/22 0400 08/16/22 0628 08/17/22 0739 08/18/22 0343  WBC 9.2 11.3* 8.5 11.8* 8.5  NEUTROABS 6.2 8.3* 5.9 8.8* 5.9  HGB 9.6* 10.2* 9.3* 10.2* 9.6*  HCT 29.4* 30.9* 28.6* 32.0* 29.6*  MCV 87.2 89.6 88.3 90.4 88.9  PLT 390 397 330 405* 361   Cardiac Enzymes: No results for input(s): "CKTOTAL", "CKMB", "CKMBINDEX", "TROPONINI" in the last 168 hours. BNP: Invalid input(s): "POCBNP" CBG: Recent Labs  Lab 08/18/22 1155 08/18/22 1449 08/18/22 1517 08/18/22 2119 08/19/22 0911  GLUCAP 185* 110* 115* 149* 156*   D-Dimer No results for input(s): "DDIMER" in the last 72 hours. Hgb A1c No results for input(s): "HGBA1C" in  the last 72 hours. Lipid Profile No results for input(s): "CHOL", "HDL", "LDLCALC", "TRIG", "CHOLHDL", "LDLDIRECT" in the last 72 hours. Thyroid function studies No results for input(s): "TSH", "T4TOTAL", "T3FREE", "THYROIDAB" in the last 72 hours.  Invalid input(s): "FREET3" Anemia work up No results for input(s): "VITAMINB12", "FOLATE", "FERRITIN", "TIBC", "IRON", "RETICCTPCT" in the last 72 hours. Urinalysis    Component Value Date/Time   COLORURINE STRAW (A) 08/12/2022 0553   APPEARANCEUR CLEAR 08/12/2022 0553   LABSPEC 1.004 (L) 08/12/2022 0553   PHURINE 6.0  08/12/2022 0553   GLUCOSEU NEGATIVE 08/12/2022 0553   HGBUR NEGATIVE 08/12/2022 0553   BILIRUBINUR NEGATIVE 08/12/2022 0553   BILIRUBINUR negative 09/25/2019 1555   KETONESUR NEGATIVE 08/12/2022 0553   PROTEINUR NEGATIVE 08/12/2022 0553   UROBILINOGEN 1.0 09/25/2019 1555   NITRITE NEGATIVE 08/12/2022 0553   LEUKOCYTESUR NEGATIVE 08/12/2022 0553   Sepsis Labs Recent Labs  Lab 08/15/22 0400 08/16/22 0628 08/17/22 0739 08/18/22 0343  WBC 11.3* 8.5 11.8* 8.5   Microbiology No results found for this or any previous visit (from the past 240 hour(s)).   Time coordinating discharge: Over 30 minutes  SIGNED:   Huey Bienenstock, MD  [facility] 08/19/2022, 10:13 AM Pager   If 7PM-7AM, please contact night-coverage [email or web address]

## 2022-08-20 ENCOUNTER — Encounter (HOSPITAL_COMMUNITY): Payer: Self-pay

## 2022-08-20 ENCOUNTER — Other Ambulatory Visit: Payer: Self-pay

## 2022-08-20 ENCOUNTER — Emergency Department (HOSPITAL_COMMUNITY): Payer: Medicaid Other

## 2022-08-20 ENCOUNTER — Inpatient Hospital Stay (HOSPITAL_COMMUNITY)
Admission: EM | Admit: 2022-08-20 | Discharge: 2022-10-28 | DRG: 853 | Disposition: A | Discharge status: Home / Self Care | Payer: Medicaid Other | Attending: Internal Medicine | Admitting: Internal Medicine

## 2022-08-20 DIAGNOSIS — M4854XA Collapsed vertebra, not elsewhere classified, thoracic region, initial encounter for fracture: Secondary | ICD-10-CM | POA: Diagnosis present

## 2022-08-20 DIAGNOSIS — E7800 Pure hypercholesterolemia, unspecified: Secondary | ICD-10-CM | POA: Diagnosis present

## 2022-08-20 DIAGNOSIS — Z635 Disruption of family by separation and divorce: Secondary | ICD-10-CM

## 2022-08-20 DIAGNOSIS — M40294 Other kyphosis, thoracic region: Secondary | ICD-10-CM | POA: Diagnosis present

## 2022-08-20 DIAGNOSIS — R7881 Bacteremia: Secondary | ICD-10-CM | POA: Diagnosis present

## 2022-08-20 DIAGNOSIS — N179 Acute kidney failure, unspecified: Secondary | ICD-10-CM | POA: Diagnosis present

## 2022-08-20 DIAGNOSIS — L0390 Cellulitis, unspecified: Secondary | ICD-10-CM | POA: Diagnosis present

## 2022-08-20 DIAGNOSIS — E860 Dehydration: Secondary | ICD-10-CM | POA: Diagnosis present

## 2022-08-20 DIAGNOSIS — E1165 Type 2 diabetes mellitus with hyperglycemia: Secondary | ICD-10-CM | POA: Diagnosis not present

## 2022-08-20 DIAGNOSIS — I5022 Chronic systolic (congestive) heart failure: Secondary | ICD-10-CM | POA: Diagnosis present

## 2022-08-20 DIAGNOSIS — Z79899 Other long term (current) drug therapy: Secondary | ICD-10-CM

## 2022-08-20 DIAGNOSIS — D62 Acute posthemorrhagic anemia: Secondary | ICD-10-CM | POA: Diagnosis not present

## 2022-08-20 DIAGNOSIS — Y712 Prosthetic and other implants, materials and accessory cardiovascular devices associated with adverse incidents: Secondary | ICD-10-CM | POA: Diagnosis not present

## 2022-08-20 DIAGNOSIS — B9562 Methicillin resistant Staphylococcus aureus infection as the cause of diseases classified elsewhere: Secondary | ICD-10-CM | POA: Diagnosis present

## 2022-08-20 DIAGNOSIS — Z5900 Homelessness unspecified: Secondary | ICD-10-CM

## 2022-08-20 DIAGNOSIS — I739 Peripheral vascular disease, unspecified: Secondary | ICD-10-CM | POA: Diagnosis present

## 2022-08-20 DIAGNOSIS — I959 Hypotension, unspecified: Secondary | ICD-10-CM | POA: Diagnosis present

## 2022-08-20 DIAGNOSIS — Z87891 Personal history of nicotine dependence: Secondary | ICD-10-CM

## 2022-08-20 DIAGNOSIS — E11649 Type 2 diabetes mellitus with hypoglycemia without coma: Secondary | ICD-10-CM | POA: Diagnosis present

## 2022-08-20 DIAGNOSIS — G8929 Other chronic pain: Secondary | ICD-10-CM | POA: Diagnosis present

## 2022-08-20 DIAGNOSIS — K5900 Constipation, unspecified: Secondary | ICD-10-CM | POA: Diagnosis present

## 2022-08-20 DIAGNOSIS — Z66 Do not resuscitate: Secondary | ICD-10-CM | POA: Diagnosis not present

## 2022-08-20 DIAGNOSIS — G929 Unspecified toxic encephalopathy: Secondary | ICD-10-CM | POA: Insufficient documentation

## 2022-08-20 DIAGNOSIS — Z791 Long term (current) use of non-steroidal anti-inflammatories (NSAID): Secondary | ICD-10-CM

## 2022-08-20 DIAGNOSIS — I10 Essential (primary) hypertension: Secondary | ICD-10-CM | POA: Diagnosis present

## 2022-08-20 DIAGNOSIS — Z8701 Personal history of pneumonia (recurrent): Secondary | ICD-10-CM

## 2022-08-20 DIAGNOSIS — R0981 Nasal congestion: Secondary | ICD-10-CM

## 2022-08-20 DIAGNOSIS — E1169 Type 2 diabetes mellitus with other specified complication: Secondary | ICD-10-CM | POA: Diagnosis present

## 2022-08-20 DIAGNOSIS — Z8619 Personal history of other infectious and parasitic diseases: Secondary | ICD-10-CM

## 2022-08-20 DIAGNOSIS — E162 Hypoglycemia, unspecified: Principal | ICD-10-CM

## 2022-08-20 DIAGNOSIS — T82514A Breakdown (mechanical) of infusion catheter, initial encounter: Secondary | ICD-10-CM | POA: Diagnosis not present

## 2022-08-20 DIAGNOSIS — J90 Pleural effusion, not elsewhere classified: Secondary | ICD-10-CM | POA: Insufficient documentation

## 2022-08-20 DIAGNOSIS — I4821 Permanent atrial fibrillation: Secondary | ICD-10-CM | POA: Diagnosis present

## 2022-08-20 DIAGNOSIS — Z751 Person awaiting admission to adequate facility elsewhere: Secondary | ICD-10-CM

## 2022-08-20 DIAGNOSIS — E871 Hypo-osmolality and hyponatremia: Secondary | ICD-10-CM | POA: Diagnosis present

## 2022-08-20 DIAGNOSIS — F05 Delirium due to known physiological condition: Secondary | ICD-10-CM | POA: Diagnosis not present

## 2022-08-20 DIAGNOSIS — E876 Hypokalemia: Secondary | ICD-10-CM | POA: Diagnosis present

## 2022-08-20 DIAGNOSIS — T82524A Displacement of infusion catheter, initial encounter: Secondary | ICD-10-CM | POA: Diagnosis not present

## 2022-08-20 DIAGNOSIS — D509 Iron deficiency anemia, unspecified: Secondary | ICD-10-CM | POA: Diagnosis present

## 2022-08-20 DIAGNOSIS — Z794 Long term (current) use of insulin: Secondary | ICD-10-CM

## 2022-08-20 DIAGNOSIS — M869 Osteomyelitis, unspecified: Secondary | ICD-10-CM | POA: Diagnosis present

## 2022-08-20 DIAGNOSIS — A4102 Sepsis due to Methicillin resistant Staphylococcus aureus: Principal | ICD-10-CM | POA: Diagnosis present

## 2022-08-20 DIAGNOSIS — G928 Other toxic encephalopathy: Secondary | ICD-10-CM | POA: Diagnosis not present

## 2022-08-20 DIAGNOSIS — Z91199 Patient's noncompliance with other medical treatment and regimen due to unspecified reason: Secondary | ICD-10-CM

## 2022-08-20 DIAGNOSIS — M4640 Discitis, unspecified, site unspecified: Secondary | ICD-10-CM | POA: Diagnosis present

## 2022-08-20 DIAGNOSIS — Z9862 Peripheral vascular angioplasty status: Secondary | ICD-10-CM

## 2022-08-20 DIAGNOSIS — I445 Left posterior fascicular block: Secondary | ICD-10-CM | POA: Diagnosis present

## 2022-08-20 DIAGNOSIS — E119 Type 2 diabetes mellitus without complications: Secondary | ICD-10-CM

## 2022-08-20 DIAGNOSIS — R338 Other retention of urine: Secondary | ICD-10-CM | POA: Insufficient documentation

## 2022-08-20 DIAGNOSIS — T40601A Poisoning by unspecified narcotics, accidental (unintentional), initial encounter: Secondary | ICD-10-CM | POA: Diagnosis present

## 2022-08-20 DIAGNOSIS — I2510 Atherosclerotic heart disease of native coronary artery without angina pectoris: Secondary | ICD-10-CM | POA: Diagnosis present

## 2022-08-20 DIAGNOSIS — N3289 Other specified disorders of bladder: Secondary | ICD-10-CM | POA: Diagnosis not present

## 2022-08-20 DIAGNOSIS — M868X7 Other osteomyelitis, ankle and foot: Secondary | ICD-10-CM | POA: Diagnosis present

## 2022-08-20 DIAGNOSIS — I4819 Other persistent atrial fibrillation: Secondary | ICD-10-CM | POA: Diagnosis present

## 2022-08-20 DIAGNOSIS — E11628 Type 2 diabetes mellitus with other skin complications: Secondary | ICD-10-CM | POA: Diagnosis present

## 2022-08-20 DIAGNOSIS — T502X5A Adverse effect of carbonic-anhydrase inhibitors, benzothiadiazides and other diuretics, initial encounter: Secondary | ICD-10-CM | POA: Diagnosis not present

## 2022-08-20 DIAGNOSIS — I700 Atherosclerosis of aorta: Secondary | ICD-10-CM | POA: Diagnosis present

## 2022-08-20 DIAGNOSIS — T8130XA Disruption of wound, unspecified, initial encounter: Secondary | ICD-10-CM | POA: Diagnosis not present

## 2022-08-20 DIAGNOSIS — G40909 Epilepsy, unspecified, not intractable, without status epilepticus: Secondary | ICD-10-CM | POA: Diagnosis present

## 2022-08-20 DIAGNOSIS — F419 Anxiety disorder, unspecified: Secondary | ICD-10-CM | POA: Diagnosis present

## 2022-08-20 DIAGNOSIS — M4624 Osteomyelitis of vertebra, thoracic region: Secondary | ICD-10-CM | POA: Diagnosis present

## 2022-08-20 DIAGNOSIS — Z515 Encounter for palliative care: Secondary | ICD-10-CM

## 2022-08-20 DIAGNOSIS — I110 Hypertensive heart disease with heart failure: Secondary | ICD-10-CM | POA: Diagnosis present

## 2022-08-20 DIAGNOSIS — F32A Depression, unspecified: Secondary | ICD-10-CM | POA: Diagnosis present

## 2022-08-20 DIAGNOSIS — G9341 Metabolic encephalopathy: Secondary | ICD-10-CM

## 2022-08-20 DIAGNOSIS — E1151 Type 2 diabetes mellitus with diabetic peripheral angiopathy without gangrene: Secondary | ICD-10-CM | POA: Diagnosis present

## 2022-08-20 DIAGNOSIS — Z89421 Acquired absence of other right toe(s): Secondary | ICD-10-CM

## 2022-08-20 DIAGNOSIS — I480 Paroxysmal atrial fibrillation: Secondary | ICD-10-CM

## 2022-08-20 DIAGNOSIS — R339 Retention of urine, unspecified: Secondary | ICD-10-CM | POA: Diagnosis present

## 2022-08-20 DIAGNOSIS — Z7901 Long term (current) use of anticoagulants: Secondary | ICD-10-CM

## 2022-08-20 DIAGNOSIS — Z8249 Family history of ischemic heart disease and other diseases of the circulatory system: Secondary | ICD-10-CM

## 2022-08-20 DIAGNOSIS — I429 Cardiomyopathy, unspecified: Secondary | ICD-10-CM | POA: Diagnosis present

## 2022-08-20 DIAGNOSIS — Z7982 Long term (current) use of aspirin: Secondary | ICD-10-CM

## 2022-08-20 DIAGNOSIS — M4644 Discitis, unspecified, thoracic region: Secondary | ICD-10-CM | POA: Diagnosis present

## 2022-08-20 LAB — CBC WITH DIFFERENTIAL/PLATELET
Abs Immature Granulocytes: 0.09 10*3/uL — ABNORMAL HIGH (ref 0.00–0.07)
Basophils Absolute: 0.1 10*3/uL (ref 0.0–0.1)
Basophils Relative: 1 %
Eosinophils Absolute: 0 10*3/uL (ref 0.0–0.5)
Eosinophils Relative: 0 %
HCT: 32.5 % — ABNORMAL LOW (ref 39.0–52.0)
Hemoglobin: 10.8 g/dL — ABNORMAL LOW (ref 13.0–17.0)
Immature Granulocytes: 1 %
Lymphocytes Relative: 6 %
Lymphs Abs: 0.7 10*3/uL (ref 0.7–4.0)
MCH: 28.7 pg (ref 26.0–34.0)
MCHC: 33.2 g/dL (ref 30.0–36.0)
MCV: 86.4 fL (ref 80.0–100.0)
Monocytes Absolute: 1 10*3/uL (ref 0.1–1.0)
Monocytes Relative: 9 %
Neutro Abs: 9.5 10*3/uL — ABNORMAL HIGH (ref 1.7–7.7)
Neutrophils Relative %: 83 %
Platelets: 392 10*3/uL (ref 150–400)
RBC: 3.76 MIL/uL — ABNORMAL LOW (ref 4.22–5.81)
RDW: 14.4 % (ref 11.5–15.5)
WBC: 11.4 10*3/uL — ABNORMAL HIGH (ref 4.0–10.5)
nRBC: 0 % (ref 0.0–0.2)

## 2022-08-20 LAB — URINALYSIS, ROUTINE W REFLEX MICROSCOPIC
Bacteria, UA: NONE SEEN
Bilirubin Urine: NEGATIVE
Glucose, UA: NEGATIVE mg/dL
Hgb urine dipstick: NEGATIVE
Ketones, ur: NEGATIVE mg/dL
Leukocytes,Ua: NEGATIVE
Nitrite: NEGATIVE
Protein, ur: 30 mg/dL — AB
Specific Gravity, Urine: 1.005 (ref 1.005–1.030)
pH: 6 (ref 5.0–8.0)

## 2022-08-20 LAB — COMPREHENSIVE METABOLIC PANEL
ALT: 15 U/L (ref 0–44)
AST: 14 U/L — ABNORMAL LOW (ref 15–41)
Albumin: 2.8 g/dL — ABNORMAL LOW (ref 3.5–5.0)
Alkaline Phosphatase: 99 U/L (ref 38–126)
Anion gap: 10 (ref 5–15)
BUN: 15 mg/dL (ref 6–20)
CO2: 23 mmol/L (ref 22–32)
Calcium: 9.5 mg/dL (ref 8.9–10.3)
Chloride: 96 mmol/L — ABNORMAL LOW (ref 98–111)
Creatinine, Ser: 1.2 mg/dL (ref 0.61–1.24)
GFR, Estimated: >60 mL/min (ref >60)
Glucose, Bld: 49 mg/dL — ABNORMAL LOW (ref 70–99)
Potassium: 2.8 mmol/L — ABNORMAL LOW (ref 3.5–5.1)
Sodium: 129 mmol/L — ABNORMAL LOW (ref 135–145)
Total Bilirubin: 0.9 mg/dL (ref 0.3–1.2)
Total Protein: 8.5 g/dL — ABNORMAL HIGH (ref 6.5–8.1)

## 2022-08-20 LAB — CBG MONITORING, ED
Glucose-Capillary: 81 mg/dL (ref 70–99)
Glucose-Capillary: 84 mg/dL (ref 70–99)
Glucose-Capillary: 40 mg/dL — CL (ref 70–99)
Glucose-Capillary: 66 mg/dL — ABNORMAL LOW (ref 70–99)
Glucose-Capillary: 102 mg/dL — ABNORMAL HIGH (ref 70–99)
Glucose-Capillary: 129 mg/dL — ABNORMAL HIGH (ref 70–99)
Glucose-Capillary: 158 mg/dL — ABNORMAL HIGH (ref 70–99)

## 2022-08-20 LAB — MRSA NEXT GEN BY PCR, NASAL: MRSA by PCR Next Gen: DETECTED — AB

## 2022-08-20 LAB — GLUCOSE, CAPILLARY
Glucose-Capillary: 125 mg/dL — ABNORMAL HIGH (ref 70–99)
Glucose-Capillary: 235 mg/dL — ABNORMAL HIGH (ref 70–99)

## 2022-08-20 MED ORDER — CHLORHEXIDINE GLUCONATE CLOTH 2 % EX PADS
6.0000 | MEDICATED_PAD | Freq: Every day | CUTANEOUS | Status: DC
  Administered 2022-08-20 – 2022-08-25 (×6): 6 via TOPICAL

## 2022-08-20 MED ORDER — ATORVASTATIN CALCIUM 80 MG PO TABS
80.0000 mg | ORAL_TABLET | Freq: Every day | ORAL | Status: DC
  Administered 2022-08-21 – 2022-10-28 (×69): 80 mg via ORAL
  Filled 2022-08-20 (×9): qty 1
  Filled 2022-08-20: qty 2
  Filled 2022-08-20 (×3): qty 1
  Filled 2022-08-20: qty 2
  Filled 2022-08-20 (×26): qty 1
  Filled 2022-08-20: qty 2
  Filled 2022-08-20 (×2): qty 1
  Filled 2022-08-20: qty 2
  Filled 2022-08-20 (×26): qty 1

## 2022-08-20 MED ORDER — DEXTROSE-SODIUM CHLORIDE 5-0.9 % IV SOLN
INTRAVENOUS | Status: DC
  Filled 2022-08-20 (×2): qty 1000

## 2022-08-20 MED ORDER — APIXABAN 5 MG PO TABS
5.0000 mg | ORAL_TABLET | Freq: Two times a day (BID) | ORAL | Status: DC
  Administered 2022-08-20 – 2022-08-22 (×5): 5 mg via ORAL
  Filled 2022-08-20 (×5): qty 1

## 2022-08-20 MED ORDER — DEXTROSE 50 % IV SOLN: 1.0000 | Freq: Once | INTRAVENOUS | Status: AC

## 2022-08-20 MED ORDER — POTASSIUM CHLORIDE 10 MEQ/100ML IV SOLN
10.0000 meq | INTRAVENOUS | Status: AC
  Administered 2022-08-20 (×2): 10 meq via INTRAVENOUS
  Filled 2022-08-20 (×2): qty 100

## 2022-08-20 MED ORDER — PANTOPRAZOLE SODIUM 40 MG PO TBEC
40.0000 mg | DELAYED_RELEASE_TABLET | Freq: Every day | ORAL | Status: DC
  Administered 2022-08-21 – 2022-10-28 (×69): 40 mg via ORAL
  Filled 2022-08-20 (×69): qty 1

## 2022-08-20 MED ORDER — ASPIRIN 81 MG PO TBEC
81.0000 mg | DELAYED_RELEASE_TABLET | Freq: Every day | ORAL | Status: DC
  Administered 2022-08-21 – 2022-08-22 (×2): 81 mg via ORAL
  Filled 2022-08-20 (×2): qty 1

## 2022-08-20 MED ORDER — ACETAMINOPHEN 650 MG RE SUPP: 650.0000 mg | Freq: Four times a day (QID) | RECTAL | Status: DC | PRN

## 2022-08-20 MED ORDER — HYDROMORPHONE HCL 1 MG/ML IJ SOLN
1.0000 mg | Freq: Once | INTRAMUSCULAR | Status: AC
  Administered 2022-08-20: 1 mg via INTRAVENOUS
  Filled 2022-08-20: qty 1

## 2022-08-20 MED ORDER — DEXTROSE 50 % IV SOLN
INTRAVENOUS | Status: AC
  Administered 2022-08-20: 50 mL via INTRAVENOUS
  Filled 2022-08-20: qty 50

## 2022-08-20 MED ORDER — ORAL CARE MOUTH RINSE: 15.0000 mL | OROMUCOSAL | Status: DC | PRN

## 2022-08-20 MED ORDER — DIGOXIN 125 MCG PO TABS
125.0000 ug | ORAL_TABLET | Freq: Every day | ORAL | Status: DC
  Administered 2022-08-21 – 2022-10-28 (×68): 125 ug via ORAL
  Filled 2022-08-20 (×69): qty 1

## 2022-08-20 MED ORDER — OXYCODONE HCL 5 MG PO TABS
5.0000 mg | ORAL_TABLET | ORAL | Status: DC | PRN
  Administered 2022-08-20 – 2022-08-22 (×8): 5 mg via ORAL
  Filled 2022-08-20 (×8): qty 1

## 2022-08-20 MED ORDER — PREGABALIN 50 MG PO CAPS: 50.0000 mg | ORAL_CAPSULE | Freq: Three times a day (TID) | ORAL | Status: DC

## 2022-08-20 MED ORDER — SACUBITRIL-VALSARTAN 49-51 MG PO TABS
1.0000 | ORAL_TABLET | Freq: Two times a day (BID) | ORAL | Status: DC
  Administered 2022-08-20 – 2022-08-25 (×10): 1 via ORAL
  Filled 2022-08-20 (×13): qty 1

## 2022-08-20 MED ORDER — ONDANSETRON HCL 4 MG PO TABS
4.0000 mg | ORAL_TABLET | Freq: Four times a day (QID) | ORAL | Status: DC | PRN
  Administered 2022-09-21 – 2022-10-27 (×4): 4 mg via ORAL
  Filled 2022-08-20 (×4): qty 1

## 2022-08-20 MED ORDER — BUSPIRONE HCL 10 MG PO TABS
15.0000 mg | ORAL_TABLET | Freq: Two times a day (BID) | ORAL | Status: DC
  Administered 2022-08-20 – 2022-10-28 (×138): 15 mg via ORAL
  Filled 2022-08-20 (×139): qty 2

## 2022-08-20 MED ORDER — DOXYCYCLINE HYCLATE 100 MG PO TABS
100.0000 mg | ORAL_TABLET | Freq: Two times a day (BID) | ORAL | Status: DC
  Administered 2022-08-20 – 2022-08-22 (×5): 100 mg via ORAL
  Filled 2022-08-20 (×6): qty 1

## 2022-08-20 MED ORDER — KCL IN DEXTROSE-NACL 40-5-0.9 MEQ/L-%-% IV SOLN
INTRAVENOUS | Status: DC
  Filled 2022-08-20 (×3): qty 1000

## 2022-08-20 MED ORDER — ONDANSETRON HCL 4 MG/2ML IJ SOLN
4.0000 mg | Freq: Four times a day (QID) | INTRAMUSCULAR | Status: DC | PRN
  Administered 2022-09-09 – 2022-10-28 (×28): 4 mg via INTRAVENOUS
  Filled 2022-08-20 (×30): qty 2

## 2022-08-20 MED ORDER — CARVEDILOL 6.25 MG PO TABS
6.2500 mg | ORAL_TABLET | Freq: Two times a day (BID) | ORAL | Status: DC
  Administered 2022-08-20 – 2022-08-26 (×13): 6.25 mg via ORAL
  Filled 2022-08-20 (×4): qty 1
  Filled 2022-08-20: qty 2
  Filled 2022-08-20 (×3): qty 1
  Filled 2022-08-20 (×5): qty 2

## 2022-08-20 MED ORDER — PREGABALIN 50 MG PO CAPS
50.0000 mg | ORAL_CAPSULE | Freq: Three times a day (TID) | ORAL | Status: DC
  Administered 2022-08-20 – 2022-09-05 (×47): 50 mg via ORAL
  Filled 2022-08-20 (×2): qty 1
  Filled 2022-08-20 (×2): qty 2
  Filled 2022-08-20: qty 1
  Filled 2022-08-20: qty 2
  Filled 2022-08-20 (×5): qty 1
  Filled 2022-08-20: qty 2
  Filled 2022-08-20 (×2): qty 1
  Filled 2022-08-20: qty 2
  Filled 2022-08-20 (×5): qty 1
  Filled 2022-08-20: qty 2
  Filled 2022-08-20: qty 1
  Filled 2022-08-20: qty 2
  Filled 2022-08-20 (×18): qty 1
  Filled 2022-08-20: qty 2
  Filled 2022-08-20 (×4): qty 1

## 2022-08-20 MED ORDER — FUROSEMIDE 40 MG PO TABS: 20.0000 mg | ORAL_TABLET | Freq: Every day | ORAL | Status: DC

## 2022-08-20 MED ORDER — FAMOTIDINE 20 MG PO TABS
20.0000 mg | ORAL_TABLET | Freq: Two times a day (BID) | ORAL | Status: DC
  Administered 2022-08-20 – 2022-10-28 (×138): 20 mg via ORAL
  Filled 2022-08-20 (×138): qty 1

## 2022-08-20 MED ORDER — ACETAMINOPHEN 325 MG PO TABS: 650.0000 mg | ORAL_TABLET | Freq: Four times a day (QID) | ORAL | Status: DC | PRN

## 2022-08-20 MED ORDER — HYDROMORPHONE HCL 1 MG/ML IJ SOLN
0.5000 mg | Freq: Once | INTRAMUSCULAR | Status: AC
  Administered 2022-08-20: 0.5 mg via INTRAVENOUS
  Filled 2022-08-20: qty 1

## 2022-08-20 MED ORDER — FUROSEMIDE 20 MG PO TABS
20.0000 mg | ORAL_TABLET | Freq: Every day | ORAL | Status: DC
  Administered 2022-08-21 – 2022-08-25 (×5): 20 mg via ORAL
  Filled 2022-08-20 (×5): qty 1

## 2022-08-20 NOTE — H&P (Signed)
History and Physical    Patient: David [name] ZOX:[id number] DOB: [birth year] DOA: 08/20/2022 DOS: the patient was seen and examined on 08/20/2022 PCP: [name], MD  Patient coming from: Home  Chief Complaint:  Chief Complaint  Patient presents with   Hypoglycemia   HPI: [name] is a 62 y.o. 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  His significant other initially thought that he may have developed an opiate overdose and gave him naloxone.  EMS initial CBG was 38 and gave him a anion Polen dextrose 50% with a follow-up CBG 195 mg/dL.  However, the patient has had at least 2 hypoglycemic CBG measurements since arriving to the emergency department.  The patient stated that he last ate yesterday evening.  He does not remember how much insulin he gave himself earlier.He denied post discharge fever, chills, rhinorrhea, sore throat, wheezing or hemoptysis.  No chest pain, palpitations, diaphoresis, PND, orthopnea or pitting edema of the lower extremities.  No abdominal pain, nausea, emesis, diarrhea, constipation, melena or hematochezia.  No flank pain, dysuria, frequency or hematuria.    ED course: Initial vital signs were temperature 97.5 F, pulse 67, respirations 17, BP 157/89 mmHg O2 sat 100% on room air.  The patient received 50 mL of dextrose 50%, hydromorphone 0.5 mg IVP, KCl 10 mEq IVP x 2 and was started on D5 NS 100 mL/h.  Lab work: His urinalysis was straw in color with proteinuria 30 mg/dL. Urine microscopic examination was unremarkable.  He has had 2 hypoglycemic CBG results while in the ED.  CBC with a white  count of 11.4, hemoglobin 10.9 g/dL and platelets 811.  CMP with a sodium 129, potassium 2.8, chloride 96 and CO2 23 mmol/L.  Glucose 49 mg/dL, albumin 2.8 g/dL and AST 14 units/L.  The rest of the CMP measurements were unremarkable after calcium level correction.  Imaging: Portable 1 view chest radiograph with increased bibasilar opacities/atelectasis with right pleural effusion and possible left pleural effusion.  On change airspace opacities within the mid and lower right lung.  Questionable pneumonia.   Review of Systems: As mentioned in the history of present illness. All other systems reviewed and are negative. Past Medical History:  Diagnosis Date   Cardiomyopathy (HCC)    a. EF 45% in 2019.   CHF (congestive heart failure) (HCC)    Chronic anticoagulation 09/30/2017   Diabetes mellitus type 2 in nonobese [facility])    Does not have health insurance    Essential hypertension 09/26/2017   Financial difficulties    H/O noncompliance with medical treatment, presenting hazards to health    Non-insulin treated type 2 diabetes mellitus (HCC) 09/26/2017   Persistent atrial fibrillation (HCC)    Sleep apnea suspected 09/30/2017   Past Surgical History:  Procedure Laterality Date   ABDOMINAL AORTOGRAM W/LOWER EXTREMITY N/A 09/02/2021   Procedure: ABDOMINAL AORTOGRAM W/LOWER EXTREMITY;  Surgeon: Nada Libman, MD;  Location: [facility] INVASIVE CV LAB;  Service: Cardiovascular;  Laterality: N/A;   ABDOMINAL AORTOGRAM W/LOWER EXTREMITY N/A 12/28/2021   Procedure: ABDOMINAL AORTOGRAM W/LOWER EXTREMITY;  Surgeon: Nada Libman, MD;  Location: [facility] INVAS[facility] CV LAB;  Service: Cardiovascular;  Laterality: N/A;   ABDOMINAL AORTOGRAM W/LOWER EXTREMITY N/A 07/28/2022  Procedure: ABDOMINAL AORTOGRAM W/LOWER EXTREMITY;  Surgeon: Cephus Shelling, MD;  Location: [facility] INVASIVE CV LAB;  Service: Cardiovascular;  Laterality: N/A;   AMPUTATION Right 09/04/2021   Procedure: 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;  Surgeon: Nadara Mustard, MD;  Location: [facility] OR;  Service: Orthopedics;  Laterality: Right;   AMPUTATION Right 08/02/2022   Procedure: AMPUTATION RAY 5TH METATARSAL OF RIGHT FOOT;  Surgeon: [name], MD;  Location: [facility] OR;  Service: Orthopedics;  Laterality: Right;   APPENDECTOMY  1971   BONE BIOPSY Right 08/02/2022   Procedure: BONE BIOPSY OF 5TH METATARSAL RIGHT FOOT;  Surgeon: [name], MD;  Location: [facility] OR;  Service: Orthopedics;  Laterality: Right;   CARDIOVERSION N/A 09/28/2017   Procedure: CARDIOVERSION;  Surgeon: Thurmon Fair, MD;  Location: [facility] ENDOSCOPY;  Service: Cardiovascular;  Laterality: N/A;   IRRIGATION AND DEBRIDEMENT FOOT  08/02/2022   Procedure: IRRIGATION AND DEBRIDEMENT RIGHT 5TH METATARSAL FOOT;  Surgeon: [name], MD;  Location: [facility] OR;  Service: Orthopedics;;   PERIPHERAL VASCULAR BALLOON ANGIOPLASTY  09/02/2021   Procedure: PERIPHERAL VASCULAR BALLOON ANGIOPLASTY;  Surgeon: Nada Libman, MD;  Location: [facility];  Service: Cardiovascular;;   PERIPHERAL VASCULAR BALLOON ANGIOPLASTY  12/28/2021   Procedure: PERIPHERAL VASCULAR BALLOON ANGIOPLASTY;  Surgeon: Nada Libman, MD;  Location: [facility];  Service: Cardiovascular;;  Posterior Tibial PTA only   TEE WITHOUT CARDIOVERSION N/A 09/28/2017   Procedure: TRANSESOPHAGEAL ECHOCARDIOGRAM (TEE);  Surgeon: Thurmon Fair, MD;  Location: [facility] ENDOSCOPY;  Service: Cardiovascular;  Laterality: N/A;   TEE WITHOUT CARDIOVERSION N/A 09/03/2021   Procedure: TRANSESOPHAGEAL ECHOCARDIOGRAM (TEE);  Surgeon: Chilton Si, MD;  Location: [facility] ENDOSCOPY;  Service: Cardiovascular;  Laterality: N/A;   TEE WITHOUT CARDIOVERSION N/A 07/15/2022   Procedure: TRANSESOPHAGEAL ECHOCARDIOGRAM;  Surgeon: Thurmon Fair, MD;  Location: [facility] INVASIVE CV LAB;  Service: Cardiovascular;  Laterality: N/A;   Social History:  reports that he quit smoking about 4 years ago. His smoking use included cigarettes. He has never used  smokeless tobacco. He reports that he does not currently use alcohol after a past usage of about 3.0 standard drinks of alcohol per week. He reports that he does not use drugs.  No Known Allergies  Family History  Problem Relation Age of Onset   Hypertension Mother     Prior to Admission medications   Medication Sig Start Date End Date Taking? Authorizing Provider  apixaban (ELIQUIS) 5 MG TABS tablet Take 1 tablet (5 mg total) by mouth 2 (two) times daily. 05/10/21   Tereso Newcomer T, PA-C  aspirin EC 81 MG tablet Take 1 tablet (81 mg total) by mouth daily. Swallow whole. 09/07/21   Burnadette Pop, MD  atorvastatin (LIPITOR) 80 MG tablet Take 1 tablet (80 mg total) by mouth daily. 08/19/22   Elgergawy, Leana Roe, MD  busPIRone (BUSPAR) 15 MG tablet Take 1 tablet (15 mg total) by mouth 2 (two) times daily. 08/19/22   Elgergawy, Leana Roe, MD  carvedilol (COREG) 6.25 MG tablet Take 1 tablet (6.25 mg total) by mouth 2 (two) times daily. 07/01/22   Sharlene Dory, PA-C  digoxin (LANOXIN) 0.125 MG tablet Take 1 tablet (125 mcg total) by mouth once daily. 06/03/22   Wendall Stade, MD  doxycycline (VIBRA-TABS) 100 MG tablet Take 1 tablet (100 mg total) by mouth 2 (two) times daily. 08/17/22 09/18/22  Randall Hiss, MD  famotidine (PEPCID) 20 MG tablet Take 1 tablet (20 mg total) by mouth 2  times daily. 03/24/22     fluticasone (FLONASE) 50 MCG/ACT nasal spray Place 2 sprays into both nostrils daily. 02/01/22   [name], MD  furosemide (LASIX) 40 MG tablet Take 1/2 tablet (20 mg total) by mouth daily. 08/19/22   Elgergawy, Leana Roe, MD  Insulin Glargine (BASAGLAR KWIKPEN) 100 UNIT/ML Inject 40 Units into the skin daily. 04/13/22   [name], MD  insulin lispro (HUMALOG KWIKPEN) 100 UNIT/ML KwikPen Inject 15 Units into the skin 3 (three) times daily. 04/13/22   [name], MD  morphine (MS CONTIN) 30 MG 12 hr tablet Take 1 tablet (30 mg total) by mouth every 12 (twelve) hours for 14 days.  08/19/22 09/02/22  Elgergawy, Leana Roe, MD  naloxone [facility]) nasal spray 4 mg/0.1 mL Opiates overdose 08/19/22   Elgergawy, Leana Roe, MD  oxyCODONE (OXY IR/ROXICODONE) 5 MG immediate release tablet Take 1 tablet (5 mg total) by mouth every 4 (four) hours as needed for moderate pain. 08/19/22   Elgergawy, Leana Roe, MD  pantoprazole (PROTONIX) 40 MG tablet Take 1 tablet (40 mg total) by mouth daily. 08/19/22 09/18/22  Elgergawy, Leana Roe, MD  polyethylene glycol powder (GLYCOLAX/MIRALAX) 17 GM/SCOOP powder Dissolve 1 capful (17 g) water and drink 2 (two) times daily. 08/19/22   Elgergawy, Leana Roe, MD  pregabalin (LYRICA) 50 MG capsule Take 1 capsule (50 mg total) by mouth 3 (three) times daily. 08/19/22   Elgergawy, Leana Roe, MD  sacubitril-valsartan (ENTRESTO) 49-51 MG Take 1 tablet by mouth 2 (two) times daily. Needs to see cardiology for refills Patient taking differently: Take 1 tablet by mouth 2 (two) times daily. 01/04/22   Claiborne Rigg, NP    Physical Exam: Vitals:   08/20/22 1514 08/20/22 1530 08/20/22 1600 08/20/22 1609  BP:  (!) 142/99  (!) 128/92  Pulse:  68 77 71  Resp:  17 (!) 29 (!) 28  Temp: (!) 97.5 F (36.4 C)     TempSrc: Oral     SpO2:  99% 98% 98%   Physical Exam Vitals and nursing note reviewed.  Constitutional:      Appearance: Normal appearance.  HENT:     Head: Normocephalic.     Nose: No rhinorrhea.     Mouth/Throat:     Mouth: Mucous membranes are moist.  Eyes:     General: No scleral icterus.    Pupils: Pupils are equal, round, and reactive to light.  Neck:     Vascular: No JVD.  Cardiovascular:     Rate and Rhythm: Normal rate and regular rhythm.     Heart sounds: S1 normal and S2 normal.  Pulmonary:     Effort: Pulmonary effort is normal.     Breath sounds: Normal breath sounds.  Abdominal:     General: Bowel sounds are normal.     Palpations: Abdomen is soft.  Musculoskeletal:     Cervical back: Neck supple.     Right lower leg: No edema.      Left lower leg: No edema.  Skin:    General: Skin is warm and dry.  Neurological:     General: No focal deficit present.     Mental Status: He is alert and oriented to person, place, and time.  Psychiatric:        Mood and Affect: Mood normal.     Data Reviewed:  Results are pending, will review when available.  07/15/2022 TEE IMPRESSIONS:   1. Left ventricular ejection fraction, by estimation, is 60  to 65%. The  left ventricle has normal function. The left ventricle has no regional  wall motion abnormalities.   2. Right ventricular systolic function is normal. The right ventricular  size is normal.   3. Left atrial size was severely dilated. No left atrial/left atrial  appendage thrombus was detected.   4. Right atrial size was severely dilated.   5. The mitral valve is normal in structure. Trivial mitral valve  regurgitation. No evidence of mitral stenosis.   6. The aortic valve is normal in structure. Aortic valve regurgitation is  not visualized. No aortic stenosis is present.   7. The inferior vena cava is normal in size with greater than 50%  respiratory variability, suggesting right atrial pressure of 3 mmHg.   EKG: Vent. 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 Continue dextrose infusion. CBG monitoring every hour and as needed. Encourage oral intake. Hold insulin administration. Hold oral antihyperglycemics. Resume therapy once eating and hypoglycemia resolved.  Active Problems:   Essential hypertension Continue carvedilol 6.25 mg p.o. twice daily    Persistent atrial fibrillation (HCC) CHA2DS2-VASc Score of at least 4. Continue  apixaban 5 mg p.o. twice daily. Continue carvedilol 6.25 mg p.o. twice daily. Keep electrolytes optimized.    Pure hypercholesterolemia Continue atorvastatin 80 mg p.o. daily.    Cellulitis in diabetic foot (HCC)   Diskitis Continue doxycycline 100 mg p.o. twice daily. Resume analgesics once he is more alert. Follow-up with ID as an outpatient.      Advance Care Planning:   Code Status: Full Code   Consults:   Family Communication:   Severity of Illness: The appropriate patient status for this patient is OBSERVATION. Observation status is judged to be reasonable and necessary in order to provide the required intensity of service to ensure the patient's safety. The patient's presenting symptoms, physical exam findings, and initial radiographic and laboratory data in the context of their medical condition is felt to place them at decreased risk for further clinical deterioration. Furthermore, it is anticipated that the patient will be medically stable for discharge from the hospital within 2 midnights of admission.   Author: Bobette Mo, MD 08/20/2022 5:00 PM  For on call review [email or web address].   This document was prepared using Dragon voice recognition software and may contain some unintended transcription errors.

## 2022-08-20 NOTE — ED Triage Notes (Signed)
BIBA from a hotel for hypoglycemia. Pt girlfriend thought he was having an opiate overdose and administered Narcan.Initial cbg 38, D50 given PTA, last cbg 195. 170/102 BP 60 HR 97% room air 20g LAC

## 2022-08-20 NOTE — ED Notes (Signed)
ED TO INPATIENT HANDOFF REPORT  ED Nurse Name and Phone #: Gian Ybarra  S Name/Age/Gender David [name] 62 y.o. male Room/Bed: WA05/WA05  Code Status   Code Status: Full Code  Home/SNF/Other Home Patient oriented to: self, place, time, and situation Is this baseline? Yes   Triage Complete: Triage complete  Chief Complaint Hypoglycemia [E16.2]  Triage Note BIBA from a hotel for hypoglycemia. Pt girlfriend thought he was having an opiate overdose and administered Narcan.Initial cbg 38, D50 given PTA, last cbg 195. 170/102 BP 60 HR 97% room air 20g LAC   Allergies No Known Allergies  Level of Care/Admitting Diagnosis ED Disposition     ED Disposition  Admit   Condition  --   Comment  Hospital Area: [facility] [[id number]]  Level of Care: Stepdown [14]  Admit to SDU based on following criteria: Severe physiological/psychological symptoms:  Any diagnosis requiring assessment & intervention at least every 4 hours on an ongoing basis to obtain desired patient outcomes including stability and rehabilitation  May place patient in observation at [facility] or [facility] if equivalent level of care is available:: No  Covid Evaluation: Asymptomatic - no recent exposure (last 10 days) testing not required  Diagnosis: Hypoglycemia [161096]  Admitting Physician: Bobette Mo [[id number]]  Attending Physician: Bobette Mo [[id number]]          B Medical/Surgery History Past Medical History:  Diagnosis Date   Cardiomyopathy (HCC)    a. EF 45% in 2019.   CHF (congestive heart failure) (HCC)    Chronic anticoagulation 09/30/2017   Diabetes mellitus type 2 in nonobese [facility])    Does not have health insurance    Essential hypertension 09/26/2017   Financial difficulties    H/O noncompliance with medical treatment, presenting hazards to health    Non-insulin treated type 2 diabetes mellitus (HCC) 09/26/2017   Persistent atrial fibrillation (HCC)     Sleep apnea suspected 09/30/2017   Past Surgical History:  Procedure Laterality Date   ABDOMINAL AORTOGRAM W/LOWER EXTREMITY N/A 09/02/2021   Procedure: ABDOMINAL AORTOGRAM W/LOWER EXTREMITY;  Surgeon: Nada Libman, MD;  Location: [facility] INVASIVE CV LAB;  Service: Cardiovascular;  Laterality: N/A;   ABDOMINAL AORTOGRAM W/LOWER EXTREMITY N/A 12/28/2021   Procedure: ABDOMINAL AORTOGRAM W/LOWER EXTREMITY;  Surgeon: Nada Libman, MD;  Location: [facility];  Service: Cardiovascular;  Laterality: N/A;   ABDOMINAL AORTOGRAM W/LOWER EXTREMITY N/A 07/28/2022   Procedure: ABDOMINAL AORTOGRAM W/LOWER EXTREMITY;  Surgeon: Cephus Shelling, MD;  Location: [facility] CV LAB;  Service: Cardiovascular;  Laterality: N/A;   AMPUTATION Right 09/04/2021   Procedure: 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;  Surgeon: Nadara Mustard, MD;  Location: [facility] OR;  Service: Orthopedics;  Laterality: Right;   AMPUTATION Right 08/02/2022   Procedure: AMPUTATION RAY 5TH METATARSAL OF RIGHT FOOT;  Surgeon: [name], MD;  Location: [facility] OR;  Service: Orthopedics;  Laterality: Right;   APPENDECTOMY  1971   BONE BIOPSY Right 08/02/2022   Procedure: BONE BIOPSY OF 5TH METATARSAL RIGHT FOOT;  Surgeon: [name], MD;  Location: [facility] OR;  Service: Orthopedics;  Laterality: Right;   CARDIOVERSION N/A 09/28/2017   Procedure: CARDIOVERSION;  Surgeon: Thurmon Fair, MD;  Location: [facility] ENDOSCOPY;  Service: Cardiovascular;  Laterality: N/A;   IRRIGATION AND DEBRIDEMENT FOOT  08/02/2022   Procedure: IRRIGATION AND DEBRIDEMENT RIGHT 5TH METATARSAL FOOT;  Surgeon: [name], MD;  Location: [facility] OR;  Service: Orthopedics;;   PERIPHERAL VASCULAR BALLOON ANGIOPLASTY  09/02/2021  Procedure: PERIPHERAL VASCULAR BALLOON ANGIOPLASTY;  Surgeon: Nada Libman, MD;  [facility];  Service: Cardiovascular;;   PERIPHERAL VASCULAR BALLOON ANGIOPLASTY  12/28/2021   Procedure: PERIPHERAL VASCULAR BALLOON ANGIOPLASTY;   Surgeon: Nada Libman, MD;  [facility];  Service: Cardiovascular;;  Posterior Tibial PTA only   TEE WITHOUT CARDIOVERSION N/A 09/28/2017   Procedure: TRANSESOPHAGEAL ECHOCARDIOGRAM (TEE);  Surgeon: Thurmon Fair, MD;  Location: [facility] ENDOSCOPY;  Service: Cardiovascular;  Laterality: N/A;   TEE WITHOUT CARDIOVERSION N/A 09/03/2021   Procedure: TRANSESOPHAGEAL ECHOCARDIOGRAM (TEE);  Surgeon: Chilton Si, MD;  Location[facility] ENDOSCOPY;  Service: Cardiovascular;  Laterality: N/A;   TEE WITHOUT CARDIOVERSION N/A 07/15/2022   Procedure: TRANSESOPHAGEAL ECHOCARDIOGRAM;  Surgeon: Thurmon Fair, MD;  [facility];  Service: Cardiovascular;  Laterality: N/A;     A IV Location/Drains/Wounds Patient Lines/Drains/Airways Status     Active Line/Drains/Airways     Name Placement date Placement time Site Days   Peripheral IV 08/20/22 20 G Left Antecubital 08/20/22  1501  Antecubital  less than 1   Peripheral IV 08/20/22 20 G Right Antecubital 08/20/22  1605  Antecubital  less than 1            Intake/Output Last 24 hours No intake or output data in the 24 hours ending 08/20/22 1728  Labs/Imaging Results for orders placed or performed during the hospital encounter of 08/20/22 (from the past 48 hour(s))  CBG monitoring, ED     Status: None   Collection Time: 08/20/22  3:09 PM  Result Value Ref Range   Glucose-Capillary 84 70 - 99 mg/dL    Comment: Glucose reference range applies only to samples taken after fasting for at least 8 hours.  CBG monitoring, ED     Status: None   Collection Time: 08/20/22  3:10 PM  Result Value Ref Range   Glucose-Capillary 81 70 - 99 mg/dL    Comment: Glucose reference range applies only to samples taken after fasting for at least 8 hours.  CBG monitoring, ED     Status: Abnormal   Collection Time: 08/20/22  3:36 PM  Result Value Ref Range   Glucose-Capillary 66 (L) 70 - 99 mg/dL    Comment: Glucose reference range applies  only to samples taken after fasting for at least 8 hours.  Urinalysis, Routine w reflex microscopic -Urine, Clean Catch     Status: Abnormal   Collection Time: 08/20/22  3:45 PM  Result Value Ref Range   Color, Urine STRAW (A) YELLOW   APPearance CLEAR CLEAR   Specific Gravity, Urine 1.005 1.005 - 1.030   pH 6.0 5.0 - 8.0   Glucose, UA NEGATIVE NEGATIVE mg/dL   Hgb urine dipstick NEGATIVE NEGATIVE   Bilirubin Urine NEGATIVE NEGATIVE   Ketones, ur NEGATIVE NEGATIVE mg/dL   Protein, ur 30 (A) NEGATIVE mg/dL   Nitrite NEGATIVE NEGATIVE   Leukocytes,Ua NEGATIVE NEGATIVE   RBC / HPF 0-5 0 - 5 RBC/hpf   WBC, UA 0-5 0 - 5 WBC/hpf   Bacteria, UA NONE SEEN NONE SEEN   Squamous Epithelial / HPF 0-5 0 - 5 /HPF    Comment: Performed at [facility], [address], [place], [place] [address]  CBG monitoring, ED     Status: Abnormal   Collection Time: 08/20/22  4:06 PM  Result Value Ref Range   Glucose-Capillary 40 (LL) 70 - 99 mg/dL    Comment: Glucose reference range applies only to  samples taken after fasting for at least 8 hours.   Comment 1 Notify RN   CBC with Differential/Platelet     Status: Abnormal   Collection Time: 08/20/22  4:14 PM  Result Value Ref Range   WBC 11.4 (H) 4.0 - 10.5 K/uL   RBC 3.76 (L) 4.22 - 5.81 MIL/uL   Hemoglobin 10.8 (L) 13.0 - 17.0 g/dL   HCT 10.2 (L) 72.5 - 36.6 %   MCV 86.4 80.0 - 100.0 fL   MCH 28.7 26.0 - 34.0 pg   MCHC 33.2 30.0 - 36.0 g/dL   RDW 44.0 34.7 - 42.5 %   Platelets 392 150 - 400 K/uL   nRBC 0.0 0.0 - 0.2 %   Neutrophils Relative % 83 %   Neutro Abs 9.5 (H) 1.7 - 7.7 K/uL   Lymphocytes Relative 6 %   Lymphs Abs 0.7 0.7 - 4.0 K/uL   Monocytes Relative 9 %   Monocytes Absolute 1.0 0.1 - 1.0 K/uL   Eosinophils Relative 0 %   Eosinophils Absolute 0.0 0.0 - 0.5 K/uL   Basophils Relative 1 %   Basophils Absolute 0.1 0.0 - 0.1 K/uL   Immature Granulocytes 1 %   Abs Immature Granulocytes 0.09 (H) 0.00 - 0.07 K/uL     Comment: Performed at [facility], [address] [place], [place] [address]  Comprehensive metabolic panel     Status: Abnormal   Collection Time: 08/20/22  4:14 PM  Result Value Ref Range   Sodium 129 (L) 135 - 145 mmol/L   Potassium 2.8 (L) 3.5 - 5.1 mmol/L   Chloride 96 (L) 98 - 111 mmol/L   CO2 23 22 - 32 mmol/L   Glucose, Bld 49 (L) 70 - 99 mg/dL    Comment: Glucose reference range applies only to samples taken after fasting for at least 8 hours.   BUN 15 6 - 20 mg/dL   Creatinine, Ser 7.56 0.61 - 1.24 mg/dL   Calcium 9.5 8.9 - 43.3 mg/dL   Total Protein 8.5 (H) 6.5 - 8.1 g/dL   Albumin 2.8 (L) 3.5 - 5.0 g/dL   AST 14 (L) 15 - 41 U/L   ALT 15 0 - 44 U/L   Alkaline Phosphatase 99 38 - 126 U/L   Total Bilirubin 0.9 0.3 - 1.2 mg/dL   GFR, Estimated >29 >51 mL/min    Comment: (NOTE) Calculated using the CKD-EPI Creatinine Equation (2021)    Anion gap 10 5 - 15    Comment: Performed at [facility], [address], [place], [place] [address]  CBG monitoring, ED     Status: Abnormal   Collection Time: 08/20/22  4:31 PM  Result Value Ref Range   Glucose-Capillary 158 (H) 70 - 99 mg/dL    Comment: Glucose reference range applies only to samples taken after fasting for at least 8 hours.   Comment 1 Notify RN   CBG monitoring, ED     Status: Abnormal   Collection Time: 08/20/22  5:01 PM  Result Value Ref Range   Glucose-Capillary 129 (H) 70 - 99 mg/dL    Comment: Glucose reference range applies only to samples taken after fasting for at least 8 hours.   DG Chest Port 1 View  Result Date: 08/20/2022 CLINICAL DATA:  Hypoglycemia. EXAM: PORTABLE CHEST 1 VIEW COMPARISON:  08/16/2022 radiograph prior studies FINDINGS: The cardiomediastinal silhouette is unchanged. Airspace opacities within the mid and LOWER RIGHT lung are again noted. Increased  bibasilar opacities/atelectasis noted with RIGHT pleural effusion. A possible LEFT pleural effusion is  now noted. There is no evidence of pneumothorax or acute bony abnormality. IMPRESSION: 1. Increased bibasilar opacities/atelectasis with RIGHT pleural effusion and possible LEFT pleural effusion. 2. Unchanged airspace opacities within the mid and LOWER RIGHT lung, question pneumonia. Electronically Signed   By: Harmon Pier M.D.   On: 08/20/2022 16:13    Pending Labs Unresulted Labs (From admission, onward)     Start     Ordered   08/21/22 0500  CBC  Tomorrow morning,   R        08/20/22 1710   08/21/22 0500  Comprehensive metabolic panel  Tomorrow morning,   R        08/20/22 1710            Vitals/Pain Today's Vitals   08/20/22 1609 08/20/22 1613 08/20/22 1700 08/20/22 1715  BP: (!) 128/92  120/86 (!) 141/73  Pulse: 71  (!) 46 67  Resp: (!) 28     Temp:      TempSrc:      SpO2: 98%  98% 98%  PainSc:  3       Isolation Precautions No active isolations  Medications Medications  potassium chloride 10 mEq in 100 mL IVPB (10 mEq Intravenous New Bag/Given 08/20/22 1705)  dextrose 5 % and 0.9 % NaCl with KCl 40 mEq/L infusion (has no administration in time range)  acetaminophen (TYLENOL) tablet 650 mg (has no administration in time range)    Or  acetaminophen (TYLENOL) suppository 650 mg (has no administration in time range)  ondansetron (ZOFRAN) tablet 4 mg (has no administration in time range)    Or  ondansetron (ZOFRAN) injection 4 mg (has no administration in time range)  HYDROmorphone (DILAUDID) injection 0.5 mg (0.5 mg Intravenous Given 08/20/22 1552)  dextrose 50 % solution 50 mL (50 mLs Intravenous Given 08/20/22 1610)  HYDROmorphone (DILAUDID) injection 0.5 mg (0.5 mg Intravenous Given 08/20/22 1709)    Mobility walks with device     Focused Assessments    R Recommendations: See Admitting Provider Note  Report given to:   Additional Notes: Mr David [name] with a cane per wife, he is A+Ox4 but drowsy and diaphoretic

## 2022-08-20 NOTE — ED Provider Notes (Signed)
[place] EMERGENCY DEPARTMENT AT [facility] Provider Note   CSN: [id number] Arrival date & time: 08/20/22  1447     History  Chief Complaint  Patient presents with   Hypoglycemia    David [name] is a 62 y.o. male.  Patient brought in by EMS for questionable overdose was administered Narcan by patient's girlfriend.  Also EMS noted that his blood sugar was 38.  He was given D50.  The combination of this did have him wake up more.  Patient is complaining of right-sided chest pain where he had a chest tube.  Patient just discharged from the hospital was admitted April 9 through May 17.  Is known to have atrial fibs on Eliquis known to have type 2 diabetes during that last hospitalization he was admitted for sepsis which ended up being MRSA back to uremia.  Had bilateral pneumonia has had a right-sided pleural effusion had T7 T9 discitis seen by neurosurgery but just infectious disease to take care of that.  The oral antibiotic he is on is doxycycline.  Patient has acute on chronic pain.  Also during the hospitalization patient had a right fifth ray amputation.  Clean dressing on that currently.  Patient's only complaint is right-sided chest pain.  Which is tender to palpation.  Past medical history significant cardiomyopathy hypertension sleep apnea type 2 diabetes presents in atrial fibrillation chronic anticoagulation and past history listed of congestive heart failure past surgical history significant for appendectomy cardioversions amputation in 2023 fourth ray foot recently had fifth done.  Send peripheral vascular balloon angioplasty in 2023 patient quit using tobacco December 2019.  Currently not using any alcohol.  Patient's admission on April 9 was acute respiratory failure with hypoxia.       Home Medications Prior to Admission medications   Medication Sig Start Date End Date Taking? Authorizing Provider  apixaban (ELIQUIS) 5 MG TABS tablet Take 1 tablet (5 mg total)  by mouth 2 (two) times daily. 05/10/21   Tereso Newcomer T, PA-C  aspirin EC 81 MG tablet Take 1 tablet (81 mg total) by mouth daily. Swallow whole. 09/07/21   Burnadette Pop, MD  atorvastatin (LIPITOR) 80 MG tablet Take 1 tablet (80 mg total) by mouth daily. 08/19/22   Elgergawy, Leana Roe, MD  busPIRone (BUSPAR) 15 MG tablet Take 1 tablet (15 mg total) by mouth 2 (two) times daily. 08/19/22   Elgergawy, Leana Roe, MD  carvedilol (COREG) 6.25 MG tablet Take 1 tablet (6.25 mg total) by mouth 2 (two) times daily. 07/01/22   Sharlene Dory, PA-C  digoxin (LANOXIN) 0.125 MG tablet Take 1 tablet (125 mcg total) by mouth once daily. 06/03/22   Wendall Stade, MD  doxycycline (VIBRA-TABS) 100 MG tablet Take 1 tablet (100 mg total) by mouth 2 (two) times daily. 08/17/22 09/18/22  Randall Hiss, MD  famotidine (PEPCID) 20 MG tablet Take 1 tablet (20 mg total) by mouth 2 times daily. 03/24/22     fluticasone (FLONASE) 50 MCG/ACT nasal spray Place 2 sprays into both nostrils daily. 02/01/22   [name], MD  furosemide (LASIX) 40 MG tablet Take 1/2 tablet (20 mg total) by mouth daily. 08/19/22   Elgergawy, Leana Roe, MD  Insulin Glargine (BASAGLAR KWIKPEN) 100 UNIT/ML Inject 40 Units into the skin daily. 04/13/22   [name], MD  insulin lispro (HUMALOG KWIKPEN) 100 UNIT/ML KwikPen Inject 15 Units into the skin 3 (three) times daily. 04/13/22   [name], MD  morphine (MS  CONTIN) 30 MG 12 hr tablet Take 1 tablet (30 mg total) by mouth every 12 (twelve) hours for 14 days. 08/19/22 09/02/22  Elgergawy, Leana Roe, MD  naloxone [facility]) nasal spray 4 mg/0.1 mL Opiates overdose 08/19/22   Elgergawy, Leana Roe, MD  oxyCODONE (OXY IR/ROXICODONE) 5 MG immediate release tablet Take 1 tablet (5 mg total) by mouth every 4 (four) hours as needed for moderate pain. 08/19/22   Elgergawy, Leana Roe, MD  pantoprazole (PROTONIX) 40 MG tablet Take 1 tablet (40 mg total) by mouth daily. 08/19/22 09/18/22  Elgergawy, Leana Roe, MD   polyethylene glycol powder (GLYCOLAX/MIRALAX) 17 GM/SCOOP powder Dissolve 1 capful (17 g) water and drink 2 (two) times daily. 08/19/22   Elgergawy, Leana Roe, MD  pregabalin (LYRICA) 50 MG capsule Take 1 capsule (50 mg total) by mouth 3 (three) times daily. 08/19/22   Elgergawy, Leana Roe, MD  sacubitril-valsartan (ENTRESTO) 49-51 MG Take 1 tablet by mouth 2 (two) times daily. Needs to see cardiology for refills Patient taking differently: Take 1 tablet by mouth 2 (two) times daily. 01/04/22   Claiborne Rigg, NP      Allergies    Patient has no known allergies.    Review of Systems   Review of Systems  Constitutional:  Positive for appetite change. Negative for chills and fever.  HENT:  Negative for ear pain and sore throat.   Eyes:  Negative for pain and visual disturbance.  Respiratory:  Negative for cough and shortness of breath.   Cardiovascular:  Positive for chest pain. Negative for palpitations.  Gastrointestinal:  Negative for abdominal pain and vomiting.  Genitourinary:  Negative for dysuria and hematuria.  Musculoskeletal:  Negative for arthralgias and back pain.  Skin:  Negative for color change and rash.  Neurological:  Negative for seizures and syncope.  All other systems reviewed and are negative.   Physical Exam Updated Vital Signs BP (!) 128/92   Pulse 71   Temp (!) 97.5 F (36.4 C) (Oral)   Resp (!) 28   SpO2 98%  Physical Exam Vitals and nursing note reviewed.  Constitutional:      General: He is not in acute distress.    Appearance: Normal appearance. He is well-developed.  HENT:     Head: Normocephalic and atraumatic.     Mouth/Throat:     Mouth: Mucous membranes are dry.  Eyes:     Extraocular Movements: Extraocular movements intact.     Conjunctiva/sclera: Conjunctivae normal.     Pupils: Pupils are equal, round, and reactive to light.  Cardiovascular:     Rate and Rhythm: Normal rate. Rhythm irregular.     Heart sounds: No murmur  heard. Pulmonary:     Effort: Pulmonary effort is normal. No respiratory distress.     Breath sounds: Normal breath sounds.  Abdominal:     Palpations: Abdomen is soft.     Tenderness: There is no abdominal tenderness.  Musculoskeletal:        General: No swelling.     Cervical back: Normal range of motion and neck supple.     Comments: Right bandage for fifth ray amputation.  This was done during his recent hospitalization  Skin:    General: Skin is warm and dry.     Capillary Refill: Capillary refill takes less than 2 seconds.  Neurological:     General: No focal deficit present.     Mental Status: He is alert and oriented to person, place, and time.  Psychiatric:        Mood and Affect: Mood normal.     ED Results / Procedures / Treatments   Labs (all labs ordered are listed, but only abnormal results are displayed) Labs Reviewed  CBC WITH DIFFERENTIAL/PLATELET - Abnormal; Notable for the following components:      Result Value   WBC 11.4 (*)    RBC 3.76 (*)    Hemoglobin 10.8 (*)    HCT 32.5 (*)    Neutro Abs 9.5 (*)    Abs Immature Granulocytes 0.09 (*)    All other components within normal limits  COMPREHENSIVE METABOLIC PANEL - Abnormal; Notable for the following components:   Sodium 129 (*)    Potassium 2.8 (*)    Chloride 96 (*)    Glucose, Bld 49 (*)    Total Protein 8.5 (*)    Albumin 2.8 (*)    AST 14 (*)    All other components within normal limits  URINALYSIS, ROUTINE W REFLEX MICROSCOPIC - Abnormal; Notable for the following components:   Color, Urine STRAW (*)    Protein, ur 30 (*)    All other components within normal limits  CBG MONITORING, ED - Abnormal; Notable for the following components:   Glucose-Capillary 66 (*)    All other components within normal limits  CBG MONITORING, ED - Abnormal; Notable for the following components:   Glucose-Capillary 40 (*)    All other components within normal limits  CBG MONITORING, ED - Abnormal; Notable for  the following components:   Glucose-Capillary 158 (*)    All other components within normal limits  CBG MONITORING, ED  CBG MONITORING, ED    EKG EKG Interpretation  Date/Time:  Saturday Aug 20 2022 16:42:39 EDT Ventricular Rate:  58 PR Interval:    QRS Duration: 110 QT Interval:  455 QTC Calculation: 447 R Axis:   93 Text Interpretation: Atrial fibrillation Left posterior fascicular block Probable anteroseptal infarct, old Borderline repolarization abnormality No significant change since last tracing Confirmed by Vanetta Mulders [phone]) on 08/20/2022 4:56:18 PM  Radiology DG Chest Port 1 View  Result Date: 08/20/2022 CLINICAL DATA:  Hypoglycemia. EXAM: PORTABLE CHEST 1 VIEW COMPARISON:  08/16/2022 radiograph prior studies FINDINGS: The cardiomediastinal silhouette is unchanged. Airspace opacities within the mid and LOWER RIGHT lung are again noted. Increased bibasilar opacities/atelectasis noted with RIGHT pleural effusion. A possible LEFT pleural effusion is now noted. There is no evidence of pneumothorax or acute bony abnormality. IMPRESSION: 1. Increased bibasilar opacities/atelectasis with RIGHT pleural effusion and possible LEFT pleural effusion. 2. Unchanged airspace opacities within the mid and LOWER RIGHT lung, question pneumonia. Electronically Signed   By: Harmon Pier M.D.   On: 08/20/2022 16:13    Procedures Procedures    Medications Ordered in ED Medications  dextrose 5 %-0.9 % sodium chloride infusion ( Intravenous New Bag/Given 08/20/22 1552)  potassium chloride 10 mEq in 100 mL IVPB (has no administration in time range)  HYDROmorphone (DILAUDID) injection 0.5 mg (0.5 mg Intravenous Given 08/20/22 1552)  dextrose 50 % solution 50 mL (50 mLs Intravenous Given 08/20/22 1610)    ED Course/ Medical Decision Making/ A&P                             Medical Decision Making Amount and/or Complexity of Data Reviewed Labs: ordered. Radiology:  ordered.  Risk Prescription drug management. Decision regarding hospitalization.   CRITICAL CARE Performed by: Vanetta Mulders  Total critical care time: 45 minutes Critical care time was exclusive of separately billable procedures and treating other patients. Critical care was necessary to treat or prevent imminent or life-threatening deterioration. Critical care was time spent personally by me on the following activities: development of treatment plan with patient and/or surrogate as well as nursing, discussions with consultants, evaluation of patient's response to treatment, examination of patient, obtaining history from patient or surrogate, ordering and performing treatments and interventions, ordering and review of laboratory studies, ordering and review of radiographic studies, pulse oximetry and re-evaluation of patient's condition.  As noted above patient just discharged from hospital yesterday after an extensive hospitalization to include sepsis bilateral pneumonia discitis MRSA bacteremia and right-sided effusion.  Discitis was T7 T9.  Patient brought back in for being unresponsive it was unclear if it was secondary to narcotic overdose he did receive Narcan.  But also he did not have lunch and his blood sugars were low.  Given an amp of D50.  Blood sugars better here but did drop back down into the 40s before patient was started on D5 normal saline so gave another amp of D50.  Electrolytes came in with his potassium low at 2.8.  So patient will receive IV potassium he does not want to take anything p.o.  Because of this he will require admission for stabilization of the hypoglycemia and the hypokalemia.  Repeat chest x-ray seems to show no acute change from x-rays during the hospital.  Temp is not elevated here.  White count is up some at 14,000 does not appear to have any sepsis parameters at this time.  Was not tachycardic not hypotensive.  Oxygen sats are 100%.  EKG shows atrial  fibrillation known to have atrial fibrillation and a left posterior fascicular block that was there before.  Patient is on Eliquis.  So no significant changes in EKG.  Does have the complaint of right-sided chest pain.  That he said was there prior to today and he did have a chest tube placed at that time.  Final Clinical Impression(s) / ED Diagnoses Final diagnoses:  Hypoglycemia  Hypokalemia    Rx / DC Orders ED Discharge Orders     None         Vanetta Mulders, MD 08/20/22 1707

## 2022-08-20 NOTE — ED Notes (Signed)
Pt given orange juice.

## 2022-08-21 DIAGNOSIS — E162 Hypoglycemia, unspecified: Secondary | ICD-10-CM | POA: Diagnosis not present

## 2022-08-21 LAB — GLUCOSE, CAPILLARY
Glucose-Capillary: 123 mg/dL — ABNORMAL HIGH (ref 70–99)
Glucose-Capillary: 255 mg/dL — ABNORMAL HIGH (ref 70–99)
Glucose-Capillary: 241 mg/dL — ABNORMAL HIGH (ref 70–99)
Glucose-Capillary: 252 mg/dL — ABNORMAL HIGH (ref 70–99)
Glucose-Capillary: 257 mg/dL — ABNORMAL HIGH (ref 70–99)
Glucose-Capillary: 164 mg/dL — ABNORMAL HIGH (ref 70–99)
Glucose-Capillary: 151 mg/dL — ABNORMAL HIGH (ref 70–99)

## 2022-08-21 LAB — COMPREHENSIVE METABOLIC PANEL
ALT: 13 U/L (ref 0–44)
AST: 11 U/L — ABNORMAL LOW (ref 15–41)
Albumin: 2.3 g/dL — ABNORMAL LOW (ref 3.5–5.0)
Alkaline Phosphatase: 82 U/L (ref 38–126)
Anion gap: 8 (ref 5–15)
BUN: 17 mg/dL (ref 6–20)
CO2: 22 mmol/L (ref 22–32)
Calcium: 8.5 mg/dL — ABNORMAL LOW (ref 8.9–10.3)
Chloride: 98 mmol/L (ref 98–111)
Creatinine, Ser: 1.24 mg/dL (ref 0.61–1.24)
GFR, Estimated: >60 mL/min (ref >60)
Glucose, Bld: 264 mg/dL — ABNORMAL HIGH (ref 70–99)
Potassium: 4 mmol/L (ref 3.5–5.1)
Sodium: 128 mmol/L — ABNORMAL LOW (ref 135–145)
Total Bilirubin: 0.6 mg/dL (ref 0.3–1.2)
Total Protein: 7 g/dL (ref 6.5–8.1)

## 2022-08-21 LAB — CBC
HCT: 28.3 % — ABNORMAL LOW (ref 39.0–52.0)
Hemoglobin: 9.4 g/dL — ABNORMAL LOW (ref 13.0–17.0)
MCH: 28.7 pg (ref 26.0–34.0)
MCHC: 33.2 g/dL (ref 30.0–36.0)
MCV: 86.5 fL (ref 80.0–100.0)
Platelets: 309 10*3/uL (ref 150–400)
RBC: 3.27 MIL/uL — ABNORMAL LOW (ref 4.22–5.81)
RDW: 14.5 % (ref 11.5–15.5)
WBC: 9.1 10*3/uL (ref 4.0–10.5)
nRBC: 0 % (ref 0.0–0.2)

## 2022-08-21 LAB — MAGNESIUM: Magnesium: 1.5 mg/dL — ABNORMAL LOW (ref 1.7–2.4)

## 2022-08-21 MED ORDER — INSULIN ASPART 100 UNIT/ML IJ SOLN
0.0000 [IU] | Freq: Every day | INTRAMUSCULAR | Status: DC
  Administered 2022-09-01 – 2022-09-04 (×3): 2 [IU] via SUBCUTANEOUS
  Administered 2022-09-05: 3 [IU] via SUBCUTANEOUS
  Administered 2022-09-14: 2 [IU] via SUBCUTANEOUS

## 2022-08-21 MED ORDER — POLYETHYLENE GLYCOL 3350 17 G PO PACK
17.0000 g | PACK | Freq: Two times a day (BID) | ORAL | Status: DC
  Administered 2022-08-21 – 2022-10-27 (×92): 17 g via ORAL
  Filled 2022-08-21 (×120): qty 1

## 2022-08-21 MED ORDER — LIDOCAINE 5 % EX PTCH
1.0000 | MEDICATED_PATCH | CUTANEOUS | Status: DC
  Administered 2022-08-21 – 2022-10-28 (×62): 1 via TRANSDERMAL
  Filled 2022-08-21 (×69): qty 1

## 2022-08-21 MED ORDER — INSULIN ASPART 100 UNIT/ML IJ SOLN
0.0000 [IU] | Freq: Three times a day (TID) | INTRAMUSCULAR | Status: DC
  Administered 2022-08-21: 2 [IU] via SUBCUTANEOUS
  Administered 2022-08-21: 5 [IU] via SUBCUTANEOUS
  Administered 2022-08-22: 1 [IU] via SUBCUTANEOUS
  Administered 2022-08-22 (×2): 2 [IU] via SUBCUTANEOUS
  Administered 2022-08-23: 3 [IU] via SUBCUTANEOUS
  Administered 2022-08-23 (×2): 2 [IU] via SUBCUTANEOUS
  Administered 2022-08-24 (×2): 1 [IU] via SUBCUTANEOUS
  Administered 2022-08-24: 2 [IU] via SUBCUTANEOUS
  Administered 2022-08-25 – 2022-08-26 (×4): 1 [IU] via SUBCUTANEOUS
  Administered 2022-08-26: 2 [IU] via SUBCUTANEOUS
  Administered 2022-08-27 (×3): 1 [IU] via SUBCUTANEOUS
  Administered 2022-08-28 (×2): 2 [IU] via SUBCUTANEOUS
  Administered 2022-08-28: 1 [IU] via SUBCUTANEOUS
  Administered 2022-08-29 – 2022-08-30 (×5): 2 [IU] via SUBCUTANEOUS
  Administered 2022-08-31: 3 [IU] via SUBCUTANEOUS
  Administered 2022-08-31: 5 [IU] via SUBCUTANEOUS
  Administered 2022-09-01: 2 [IU] via SUBCUTANEOUS
  Administered 2022-09-01: 3 [IU] via SUBCUTANEOUS
  Administered 2022-09-01: 2 [IU] via SUBCUTANEOUS
  Administered 2022-09-02: 5 [IU] via SUBCUTANEOUS
  Administered 2022-09-02: 3 [IU] via SUBCUTANEOUS
  Administered 2022-09-02 – 2022-09-03 (×3): 2 [IU] via SUBCUTANEOUS
  Administered 2022-09-03 – 2022-09-05 (×6): 3 [IU] via SUBCUTANEOUS
  Administered 2022-09-05: 2 [IU] via SUBCUTANEOUS
  Administered 2022-09-06: 3 [IU] via SUBCUTANEOUS
  Administered 2022-09-06 (×2): 2 [IU] via SUBCUTANEOUS
  Administered 2022-09-07: 3 [IU] via SUBCUTANEOUS
  Administered 2022-09-07 (×2): 2 [IU] via SUBCUTANEOUS
  Administered 2022-09-08: 1 [IU] via SUBCUTANEOUS
  Administered 2022-09-08: 2 [IU] via SUBCUTANEOUS
  Administered 2022-09-09: 3 [IU] via SUBCUTANEOUS
  Administered 2022-09-09: 1 [IU] via SUBCUTANEOUS
  Administered 2022-09-10: 2 [IU] via SUBCUTANEOUS
  Administered 2022-09-12 (×3): 1 [IU] via SUBCUTANEOUS
  Administered 2022-09-13 – 2022-09-17 (×5): 2 [IU] via SUBCUTANEOUS
  Administered 2022-09-18: 1 [IU] via SUBCUTANEOUS
  Administered 2022-09-18: 2 [IU] via SUBCUTANEOUS
  Administered 2022-09-18: 1 [IU] via SUBCUTANEOUS
  Administered 2022-09-19: 2 [IU] via SUBCUTANEOUS
  Administered 2022-09-19 – 2022-09-20 (×4): 1 [IU] via SUBCUTANEOUS
  Administered 2022-09-21: 2 [IU] via SUBCUTANEOUS
  Administered 2022-09-22: 1 [IU] via SUBCUTANEOUS
  Administered 2022-09-22 – 2022-09-23 (×2): 2 [IU] via SUBCUTANEOUS

## 2022-08-21 MED ORDER — MORPHINE SULFATE ER 30 MG PO TBCR
30.0000 mg | EXTENDED_RELEASE_TABLET | Freq: Two times a day (BID) | ORAL | Status: DC
  Administered 2022-08-21 – 2022-08-31 (×20): 30 mg via ORAL
  Filled 2022-08-21 (×20): qty 1

## 2022-08-21 MED ORDER — ACETAMINOPHEN 500 MG PO TABS
1000.0000 mg | ORAL_TABLET | Freq: Three times a day (TID) | ORAL | Status: DC
  Administered 2022-08-21 – 2022-10-20 (×179): 1000 mg via ORAL
  Administered 2022-10-22 – 2022-10-23 (×4): 500 mg via ORAL
  Administered 2022-10-24 – 2022-10-28 (×6): 1000 mg via ORAL
  Filled 2022-08-21 (×195): qty 2

## 2022-08-21 MED ORDER — FLUTICASONE PROPIONATE 50 MCG/ACT NA SUSP
1.0000 | Freq: Every day | NASAL | Status: DC
  Administered 2022-08-21 – 2022-10-28 (×63): 1 via NASAL
  Filled 2022-08-21 (×3): qty 16

## 2022-08-21 MED ORDER — METHOCARBAMOL 500 MG PO TABS
500.0000 mg | ORAL_TABLET | Freq: Three times a day (TID) | ORAL | Status: DC
  Administered 2022-08-21 – 2022-08-22 (×4): 500 mg via ORAL
  Filled 2022-08-21 (×4): qty 1

## 2022-08-21 NOTE — Progress Notes (Addendum)
PROGRESS NOTE    David [name]  ZOX:[id number] DOB: April 15, 1960 DOA: 08/20/2022 PCP: [name], MD    Brief Narrative:   David [name] is a 62 y.o. 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  His significant other initially thought that he may have developed an opiate overdose and gave him naloxone.  EMS initial CBG was 38 and gave him a anion Polen dextrose 50% with a follow-up CBG 195 mg/dL.  However, the patient has had at least 2 hypoglycemic CBG measurements since arriving to the emergency department.  The patient stated that he last ate yesterday evening.  He does not remember how much insulin he gave himself earlier.  Appears to have been taking too much pain medication at home.   Assessment and Plan: Hypoglycemia In the setting of:   Insulin dependent type 2 diabetes mellitus (HCC) -stop d5 gtt-- continue to monitor CBG -advance diet as tolerated Hold insulin administration. Hold oral antihyperglycemics. Resume therapy once eating and hypoglycemia resolved.  Acute on chronic pain -resume home regimen of long acting -add scheduled muscle relaxer  -lidocaine patch -schedule tylenol -no red flag with decreased sensation/mobility    Essential hypertension Continue carvedilol 6.25 mg p.o. twice daily     Persistent atrial fibrillation (HCC) CHA2DS2-VASc Score of at least 4. Continue apixaban 5 mg p.o. twice daily. Continue carvedilol 6.25 mg p.o. twice daily. -appears to be schedule for a cardioversion     Pure hypercholesterolemia Continue atorvastatin 80 mg p.o. daily.     Cellulitis in diabetic foot  (HCC)   Diskitis Continue doxycycline 100 mg p.o. twice daily. Follow-up with ID as an outpatient. -pain control   PT eval   DVT prophylaxis:  apixaban (ELIQUIS) tablet 5 mg    Code Status: Full Code   Disposition Plan:  Level of care: Stepdown Status is: Observation The patient will require care spanning > 2 midnights and should be moved to inpatient    Consultants:  none   Subjective: Was taking the pain medication more than advised as no time frame on his bottle from the pharmacy  Objective: Vitals:   08/21/22 0600 08/21/22 0700 08/21/22 0800 08/21/22 0830  BP: 126/86  123/73   Pulse: 73 78 80   Resp: 20 (!) 22 16   Temp:    98.2 F (36.8 C)  TempSrc:    Oral  SpO2: 94% 98% 98%   Weight:      Height:        Intake/Output Summary (Last 24 hours) at 08/21/2022 1136 Last data filed at 08/21/2022 0800 Gross per 24 hour  Intake 2224.8 ml  Output 1920 ml  Net 304.8 ml   Filed Weights   08/20/22 1821  Weight: 77.8 kg    Examination:   General: Appearance:    Well developed, well nourished male in no acute distress     Lungs:     Clear to auscultation bilaterally, respirations unlabored  Heart:    Normal heart rate. .       Neurologic:   Awake, alert, oriented x 3. No apparent focal neurological           defect.        Data Reviewed: I  have personally reviewed following labs and imaging studies  CBC: Recent Labs  Lab 08/15/22 0400 08/16/22 0628 08/17/22 0739 08/18/22 0343 08/20/22 1614 08/21/22 0256  WBC 11.3* 8.5 11.8* 8.5 11.4* 9.1  NEUTROABS 8.3* 5.9 8.8* 5.9 9.5*  --   HGB 10.2* 9.3* 10.2* 9.6* 10.8* 9.4*  HCT 30.9* 28.6* 32.0* 29.6* 32.5* 28.3*  MCV 89.6 88.3 90.4 88.9 86.4 86.5  PLT 397 330 405* 361 392 309   Basic Metabolic Panel: Recent Labs  Lab 08/15/22 0400 08/16/22 0628 08/17/22 0733 08/17/22 0739 08/18/22 0343 08/20/22 1614 08/21/22 0256  NA 128*  --  130*  --   --  129* 128*  K 4.6  --  4.4  --   --  2.8* 4.0  CL  98  --  100  --   --  96* 98  CO2 22  --  21*  --   --  23 22  GLUCOSE 170*  --  118*  --   --  49* 264*  BUN 15  --  15  --   --  15 17  CREATININE 1.52*  --  1.33*  --   --  1.20 1.24  CALCIUM 9.4  --  9.5  --   --  9.5 8.5*  MG 1.9 1.8  --  1.8 1.8  --   --   PHOS 4.2 3.7  --  3.6 3.1  --   --    GFR: Estimated Creatinine Clearance: 69.5 mL/min (by C-G formula based on SCr of 1.24 mg/dL). Liver Function Tests: Recent Labs  Lab 08/15/22 0400 08/20/22 1614 08/21/22 0256  AST 16 14* 11*  ALT 16 15 13   ALKPHOS 98 99 82  BILITOT 1.0 0.9 0.6  PROT 7.7 8.5* 7.0  ALBUMIN 2.3* 2.8* 2.3*   No results for input(s): "LIPASE", "AMYLASE" in the last 168 hours. No results for input(s): "AMMONIA" in the last 168 hours. Coagulation Profile: No results for input(s): "INR", "PROTIME" in the last 168 hours. Cardiac Enzymes: No results for input(s): "CKTOTAL", "CKMB", "CKMBINDEX", "TROPONINI" in the last 168 hours. BNP (last 3 results) No results for input(s): "PROBNP" in the last 8760 hours. HbA1C: No results for input(s): "HGBA1C" in the last 72 hours. CBG: Recent Labs  Lab 08/20/22 2342 08/21/22 0327 08/21/22 0617 08/21/22 0739 08/21/22 1114  GLUCAP 235* 255* 241* 252* 257*   Lipid Profile: No results for input(s): "CHOL", "HDL", "LDLCALC", "TRIG", "CHOLHDL", "LDLDIRECT" in the last 72 hours. Thyroid Function Tests: No results for input(s): "TSH", "T4TOTAL", "FREET4", "T3FREE", "THYROIDAB" in the last 72 hours. Anemia Panel: No results for input(s): "VITAMINB12", "FOLATE", "FERRITIN", "TIBC", "IRON", "RETICCTPCT" in the last 72 hours. Sepsis Labs: Recent Labs  Lab 08/15/22 0400 08/16/22 0628  PROCALCITON 0.10 <0.10    Recent Results (from the past 240 hour(s))  MRSA Next Gen by PCR, Nasal     Status: Abnormal   Collection Time: 08/20/22  6:16 PM   Specimen: Nasal Mucosa; Nasal Swab  Result Value Ref Range Status   MRSA by PCR Next Gen DETECTED (A) NOT DETECTED Final     Comment: RESULT CALLED TO, READ BACK BY AND VERIFIED WITH: HARRIS,D AT 2208 ON 08/20/22 BY LUZOLOP (NOTE) The GeneXpert MRSA Assay (FDA approved for NASAL specimens only), is one component of a comprehensive MRSA colonization surveillance program. It is not intended to diagnose MRSA infection nor to guide or monitor treatment for MRSA infections. Test performance is not FDA approved in patients less than  13 years old. Performed at [facility], [address], [place], [place] [address]          Radiology Studies: [facility] Chest Port 1 View  Result Date: 08/20/2022 CLINICAL DATA:  Hypoglycemia. EXAM: PORTABLE CHEST 1 VIEW COMPARISON:  08/16/2022 radiograph prior studies FINDINGS: The cardiomediastinal silhouette is unchanged. Airspace opacities within the mid and LOWER RIGHT lung are again noted. Increased bibasilar opacities/atelectasis noted with RIGHT pleural effusion. A possible LEFT pleural effusion is now noted. There is no evidence of pneumothorax or acute bony abnormality. IMPRESSION: 1. Increased bibasilar opacities/atelectasis with RIGHT pleural effusion and possible LEFT pleural effusion. 2. Unchanged airspace opacities within the mid and LOWER RIGHT lung, question pneumonia. Electronically Signed   By: Harmon Pier M.D.   On: 08/20/2022 16:13        Scheduled Meds:  acetaminophen  1,000 mg Oral TID   apixaban  5 mg Oral BID   aspirin EC  81 mg Oral Daily   atorvastatin  80 mg Oral Daily   busPIRone  15 mg Oral BID   carvedilol  6.25 mg Oral BID   Chlorhexidine Gluconate Cloth  6 each Topical Daily   digoxin  125 mcg Oral Daily   doxycycline  100 mg Oral BID   famotidine  20 mg Oral BID   furosemide  20 mg Oral Daily   lidocaine  1 patch Transdermal Q24H   methocarbamol  500 mg Oral TID   morphine  30 mg Oral Q12H   pantoprazole  40 mg Oral Daily   polyethylene glycol  17 g Oral BID   pregabalin  50 mg Oral TID   sacubitril-valsartan  1 tablet Oral  BID   Continuous Infusions:   LOS: 0 days    Time spent: 45 minutes spent on chart review, discussion with nursing staff, consultants, updating family and interview/physical exam; more than 50% of that time was spent in counseling and/or coordination of care.    Joseph Art, DO [facility] Available via Epic secure chat 7am-7pm After these hours, please refer to coverage provider listed on [email or web address] 08/21/2022, 11:36 AM

## 2022-08-22 ENCOUNTER — Observation Stay (HOSPITAL_COMMUNITY): Payer: Medicaid Other

## 2022-08-22 ENCOUNTER — Other Ambulatory Visit: Payer: Self-pay | Admitting: Neurological Surgery

## 2022-08-22 ENCOUNTER — Encounter (HOSPITAL_COMMUNITY): Payer: Self-pay | Admitting: Radiology

## 2022-08-22 DIAGNOSIS — E162 Hypoglycemia, unspecified: Secondary | ICD-10-CM | POA: Diagnosis not present

## 2022-08-22 LAB — BASIC METABOLIC PANEL
Anion gap: 9 (ref 5–15)
BUN: 15 mg/dL (ref 6–20)
CO2: 22 mmol/L (ref 22–32)
Calcium: 9 mg/dL (ref 8.9–10.3)
Chloride: 102 mmol/L (ref 98–111)
Creatinine, Ser: 1.2 mg/dL (ref 0.61–1.24)
GFR, Estimated: >60 mL/min (ref >60)
Glucose, Bld: 158 mg/dL — ABNORMAL HIGH (ref 70–99)
Potassium: 4.1 mmol/L (ref 3.5–5.1)
Sodium: 133 mmol/L — ABNORMAL LOW (ref 135–145)

## 2022-08-22 LAB — GLUCOSE, CAPILLARY
Glucose-Capillary: 143 mg/dL — ABNORMAL HIGH (ref 70–99)
Glucose-Capillary: 166 mg/dL — ABNORMAL HIGH (ref 70–99)
Glucose-Capillary: 171 mg/dL — ABNORMAL HIGH (ref 70–99)
Glucose-Capillary: 180 mg/dL — ABNORMAL HIGH (ref 70–99)
Glucose-Capillary: 172 mg/dL — ABNORMAL HIGH (ref 70–99)

## 2022-08-22 LAB — CBC
HCT: 33.9 % — ABNORMAL LOW (ref 39.0–52.0)
Hemoglobin: 10.9 g/dL — ABNORMAL LOW (ref 13.0–17.0)
MCH: 28.5 pg (ref 26.0–34.0)
MCHC: 32.2 g/dL (ref 30.0–36.0)
MCV: 88.7 fL (ref 80.0–100.0)
Platelets: 345 10*3/uL (ref 150–400)
RBC: 3.82 MIL/uL — ABNORMAL LOW (ref 4.22–5.81)
RDW: 14.7 % (ref 11.5–15.5)
WBC: 9.3 10*3/uL (ref 4.0–10.5)
nRBC: 0 % (ref 0.0–0.2)

## 2022-08-22 LAB — SEDIMENTATION RATE: Sed Rate: 86 mm/hr — ABNORMAL HIGH (ref 0–16)

## 2022-08-22 LAB — C-REACTIVE PROTEIN: CRP: 5.4 mg/dL — ABNORMAL HIGH (ref <1.0)

## 2022-08-22 MED ORDER — IOHEXOL 350 MG/ML SOLN
100.0000 mL | Freq: Once | INTRAVENOUS | Status: AC | PRN
  Administered 2022-08-22: 100 mL via INTRAVENOUS

## 2022-08-22 MED ORDER — LORAZEPAM 2 MG/ML IJ SOLN
2.0000 mg | Freq: Once | INTRAMUSCULAR | Status: AC
  Administered 2022-08-22: 2 mg via INTRAVENOUS
  Filled 2022-08-22: qty 1

## 2022-08-22 MED ORDER — GADOBUTROL 1 MMOL/ML IV SOLN
8.0000 mL | Freq: Once | INTRAVENOUS | Status: AC | PRN
  Administered 2022-08-22: 8 mL via INTRAVENOUS

## 2022-08-22 MED ORDER — MAGNESIUM SULFATE 4 GM/100ML IV SOLN
4.0000 g | Freq: Once | INTRAVENOUS | Status: AC
  Administered 2022-08-22: 4 g via INTRAVENOUS
  Filled 2022-08-22: qty 100

## 2022-08-22 MED ORDER — METHOCARBAMOL 500 MG PO TABS
500.0000 mg | ORAL_TABLET | Freq: Four times a day (QID) | ORAL | Status: DC
  Administered 2022-08-22 – 2022-08-25 (×12): 500 mg via ORAL
  Filled 2022-08-22 (×12): qty 1

## 2022-08-22 MED ORDER — OXYCODONE HCL 5 MG PO TABS
5.0000 mg | ORAL_TABLET | ORAL | Status: DC | PRN
  Administered 2022-08-22 (×2): 5 mg via ORAL
  Filled 2022-08-22 (×2): qty 1

## 2022-08-22 MED ORDER — HYDROMORPHONE HCL 1 MG/ML IJ SOLN
0.5000 mg | INTRAMUSCULAR | Status: DC | PRN
  Administered 2022-08-22 – 2022-08-24 (×5): 0.5 mg via INTRAVENOUS
  Filled 2022-08-22: qty 0.5
  Filled 2022-08-22: qty 1
  Filled 2022-08-22: qty 0.5
  Filled 2022-08-22 (×2): qty 1

## 2022-08-22 MED ORDER — OXYCODONE HCL 5 MG PO TABS
10.0000 mg | ORAL_TABLET | ORAL | Status: DC | PRN
  Administered 2022-08-22 – 2022-08-23 (×6): 10 mg via ORAL
  Filled 2022-08-22 (×6): qty 2

## 2022-08-22 NOTE — Progress Notes (Signed)
PT Cancellation Note  Patient Details Name: David [name] MRN: [id number] DOB: December 22, 1960   Cancelled Treatment:    Reason Eval/Treat Not Completed: Pain limiting ability to participate, also RN attempting to contact family to bring TLSO and Darco shoe. Blanchard Kelch PT Acute Rehabilitation Services Office [phone] Weekend [phone]    Rada Hay 08/22/2022, 11:05 AM

## 2022-08-22 NOTE — Progress Notes (Signed)
This RN attempted to call patient's spouse, but call was unanswered with voicemail set up. Patient is needing back and foot brace from home to participate in PT. This RN will attempt to call again later.

## 2022-08-22 NOTE — Progress Notes (Signed)
Patient with abnormal MRI.  Discussed with NS.  Will transfer for evaluation at [facility].  Elective cardioversion will need to be cancelled for Wednesday if patient needs neurosurgery    David [name]

## 2022-08-22 NOTE — Progress Notes (Addendum)
PROGRESS NOTE    David [name]  ZOX:[id number] DOB: 1960-06-27 DOA: 08/20/2022 PCP: [name], MD    Brief Narrative:   David [name] is a 62 y.o. 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  His significant other initially thought that he may have developed an opiate overdose and gave him naloxone.  EMS initial CBG was 38 and gave him a anion Polen dextrose 50% with a follow-up CBG 195 mg/dL.  However, the patient has had at least 2 hypoglycemic CBG measurements since arriving to the emergency department.  The patient stated that he last ate yesterday evening.  He does not remember how much insulin he gave himself earlier.  Appears to have been taking too much pain medication at home.   Assessment and Plan: Hypoglycemia In the setting of:   Insulin dependent type 2 diabetes mellitus (HCC) -stop d5 gtt-- continue to monitor CBG -advance diet as tolerated -SSI   Right chest wall pain- worse with breathing -check CTA -? 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 -check ESR/CRP -placed call to NS-- get MRI thoracic w/wo contrast -place call to ID  Acute on chronic pain -resume home regimen of long acting -add scheduled muscle relaxer  -lidocaine patch -schedule tylenol -no red flag with decreased sensation/mobility- no need for re-imaging -pain  appears to move depending on who is asking    Essential hypertension Continue carvedilol 6.25 mg p.o. twice daily     Persistent atrial fibrillation (HCC) CHA2DS2-VASc Score of at least 4. Continue apixaban 5 mg p.o. twice daily. Continue carvedilol 6.25 mg p.o. twice daily. -appears to be schedule for a cardioversion     Pure hypercholesterolemia Continue atorvastatin 80 mg p.o. daily.     Cellulitis in diabetic foot (HCC)   Diskitis Continue doxycycline 100 mg p.o. twice daily. Follow-up with ID as an outpatient. -pain control   PT eval   DVT prophylaxis:  apixaban (ELIQUIS) tablet 5 mg    Code Status: Full Code   Disposition Plan:  Level of care: Stepdown Status is: Observation The patient will require care spanning > 2 midnights and should be moved to inpatient    Consultants:  none   Subjective: Was taking the pain medication more than advised as no time frame on his bottle from the pharmacy  Objective: Vitals:   08/22/22 0954 08/22/22 1000 08/22/22 1020 08/22/22 1100  BP:   (!) 111/58 128/79  Pulse: 72 85 75 62  Resp: 18 16 17 19   Temp:      TempSrc:      SpO2: 94% 93% 97% 96%  Weight:      Height:        Intake/Output Summary (Last 24 hours) at 08/22/2022 1200 Last data filed at 08/22/2022 1027 Gross per 24 hour  Intake 758.09 ml  Output 2775 ml  Net -2016.91 ml   Filed Weights   08/20/22 1821  Weight: 77.8 kg    Examination:   General: Appearance:  Well developed, well nourished male in no acute distress     Lungs:     Clear to auscultation bilaterally, respirations unlabored  Heart:    Normal heart rate. .       Neurologic:   Awake, alert, oriented x 3. No apparent focal neurological           defect.        Data Reviewed: I have personally reviewed following labs and imaging studies  CBC: Recent Labs  Lab 08/16/22 0628 08/17/22 0739 08/18/22 0343 08/20/22 1614 08/21/22 0256 08/22/22 0313  WBC 8.5 11.8* 8.5 11.4* 9.1  9.3  NEUTROABS 5.9 8.8* 5.9 9.5*  --   --   HGB 9.3* 10.2* 9.6* 10.8* 9.4* 10.9*  HCT 28.6* 32.0* 29.6* 32.5* 28.3* 33.9*  MCV 88.3 90.4 88.9 86.4 86.5 88.7  PLT 330 405* 361 392 309 345   Basic Metabolic Panel: Recent Labs  Lab 08/16/22 0628 08/17/22 0733 08/17/22 0739 08/18/22 0343 08/20/22 1614 08/21/22 0256 08/22/22 0313  NA  --  130*  --   --  129* 128* 133*  K  --  4.4  --   --  2.8* 4.0 4.1  CL  --  100  --   --  96* 98 102  CO2  --  21*  --   --  23 22 22   GLUCOSE  --  118*  --   --  49* 264* 158*  BUN  --  15  --   --  15 17 15   CREATININE  --  1.33*  --   --  1.20 1.24 1.20  CALCIUM  --  9.5  --   --  9.5 8.5* 9.0  MG 1.8  --  1.8 1.8  --  1.5*  --   PHOS 3.7  --  3.6 3.1  --   --   --    GFR: Estimated Creatinine Clearance: 71.9 mL/min (by C-G formula based on SCr of 1.2 mg/dL). Liver Function Tests: Recent Labs  Lab 08/20/22 1614 08/21/22 0256  AST 14* 11*  ALT 15 13  ALKPHOS 99 82  BILITOT 0.9 0.6  PROT 8.5* 7.0  ALBUMIN 2.8* 2.3*   No results for input(s): "LIPASE", "AMYLASE" in the last 168 hours. No results for input(s): "AMMONIA" in the last 168 hours. Coagulation Profile: No results for input(s): "INR", "PROTIME" in the last 168 hours. Cardiac Enzymes: No results for input(s): "CKTOTAL", "CKMB", "CKMBINDEX", "TROPONINI" in the last 168 hours. BNP (last 3 results) No results for input(s): "PROBNP" in the last 8760 hours. HbA1C: No results for input(s): "HGBA1C" in the last 72 hours. CBG: Recent Labs  Lab 08/21/22 1114 08/21/22 1619 08/21/22 2137 08/22/22 0809 08/22/22 1153  GLUCAP 257* 164* 151* 143* 166*   Lipid Profile: No results for input(s): "CHOL", "HDL", "LDLCALC", "TRIG", "CHOLHDL", "LDLDIRECT" in the last 72 hours. Thyroid Function Tests: No results for input(s): "TSH", "T4TOTAL", "FREET4", "T3FREE", "THYROIDAB" in the last 72 hours. Anemia Panel: No results for input(s): "VITAMINB12", "FOLATE", "FERRITIN", "TIBC", "IRON",  "RETICCTPCT" in the last 72 hours. Sepsis Labs: Recent Labs  Lab 08/16/22 0628  PROCALCITON <0.10    Recent Results (from the past 240 hour(s))  MRSA Next Gen by PCR, Nasal     Status: Abnormal   Collection Time: 08/20/22  6:16 PM   Specimen: Nasal Mucosa; Nasal Swab  Result Value Ref Range Status   MRSA by PCR Next Gen DETECTED (A) NOT DETECTED Final  Comment: RESULT CALLED TO, READ BACK BY AND VERIFIED WITH: HARRIS,D AT 2208 ON 08/20/22 BY LUZOLOP (NOTE) The GeneXpert MRSA Assay (FDA approved for NASAL specimens only), is one component of a comprehensive MRSA colonization surveillance program. It is not intended to diagnose MRSA infection nor to guide or monitor treatment for MRSA infections. Test performance is not FDA approved in patients less than 50 years old. Performed at [facility], [address], [place], [place] [address]          Radiology Studies: [facility] Chest Port 1 View  Result Date: 08/20/2022 CLINICAL DATA:  Hypoglycemia. EXAM: PORTABLE CHEST 1 VIEW COMPARISON:  08/16/2022 radiograph prior studies FINDINGS: The cardiomediastinal silhouette is unchanged. Airspace opacities within the mid and LOWER RIGHT lung are again noted. Increased bibasilar opacities/atelectasis noted with RIGHT pleural effusion. A possible LEFT pleural effusion is now noted. There is no evidence of pneumothorax or acute bony abnormality. IMPRESSION: 1. Increased bibasilar opacities/atelectasis with RIGHT pleural effusion and possible LEFT pleural effusion. 2. Unchanged airspace opacities within the mid and LOWER RIGHT lung, question pneumonia. Electronically Signed   By: Harmon Pier M.D.   On: 08/20/2022 16:13        Scheduled Meds:  acetaminophen  1,000 mg Oral TID   apixaban  5 mg Oral BID   aspirin EC  81 mg Oral Daily   atorvastatin  80 mg Oral Daily   busPIRone  15 mg Oral BID   carvedilol  6.25 mg Oral BID   Chlorhexidine Gluconate Cloth  6 each Topical  Daily   digoxin  125 mcg Oral Daily   doxycycline  100 mg Oral BID   famotidine  20 mg Oral BID   fluticasone  1 spray Each Nare Daily   furosemide  20 mg Oral Daily   insulin aspart  0-5 Units Subcutaneous QHS   insulin aspart  0-9 Units Subcutaneous TID WC   lidocaine  1 patch Transdermal Q24H   methocarbamol  500 mg Oral QID   morphine  30 mg Oral Q12H   pantoprazole  40 mg Oral Daily   polyethylene glycol  17 g Oral BID   pregabalin  50 mg Oral TID   sacubitril-valsartan  1 tablet Oral BID   Continuous Infusions:   LOS: 0 days    Time spent: 45 minutes spent on chart review, discussion with nursing staff, consultants, updating family and interview/physical exam; more than 50% of that time was spent in counseling and/or coordination of care.    Joseph Art, DO [facility] Available via Epic secure chat 7am-7pm After these hours, please refer to coverage provider listed on [email or web address] 08/22/2022, 12:00 PM

## 2022-08-23 DIAGNOSIS — Z8614 Personal history of Methicillin resistant Staphylococcus aureus infection: Secondary | ICD-10-CM | POA: Diagnosis not present

## 2022-08-23 DIAGNOSIS — Z515 Encounter for palliative care: Secondary | ICD-10-CM | POA: Diagnosis not present

## 2022-08-23 DIAGNOSIS — M4624 Osteomyelitis of vertebra, thoracic region: Secondary | ICD-10-CM

## 2022-08-23 DIAGNOSIS — L0390 Cellulitis, unspecified: Secondary | ICD-10-CM | POA: Diagnosis present

## 2022-08-23 DIAGNOSIS — F05 Delirium due to known physiological condition: Secondary | ICD-10-CM | POA: Diagnosis not present

## 2022-08-23 DIAGNOSIS — D509 Iron deficiency anemia, unspecified: Secondary | ICD-10-CM | POA: Diagnosis present

## 2022-08-23 DIAGNOSIS — G9341 Metabolic encephalopathy: Secondary | ICD-10-CM | POA: Diagnosis not present

## 2022-08-23 DIAGNOSIS — I429 Cardiomyopathy, unspecified: Secondary | ICD-10-CM | POA: Diagnosis present

## 2022-08-23 DIAGNOSIS — Z794 Long term (current) use of insulin: Secondary | ICD-10-CM | POA: Diagnosis not present

## 2022-08-23 DIAGNOSIS — T8130XA Disruption of wound, unspecified, initial encounter: Secondary | ICD-10-CM | POA: Diagnosis not present

## 2022-08-23 DIAGNOSIS — E871 Hypo-osmolality and hyponatremia: Secondary | ICD-10-CM | POA: Diagnosis present

## 2022-08-23 DIAGNOSIS — I4821 Permanent atrial fibrillation: Secondary | ICD-10-CM | POA: Diagnosis present

## 2022-08-23 DIAGNOSIS — E11649 Type 2 diabetes mellitus with hypoglycemia without coma: Secondary | ICD-10-CM | POA: Diagnosis present

## 2022-08-23 DIAGNOSIS — I10 Essential (primary) hypertension: Secondary | ICD-10-CM | POA: Diagnosis not present

## 2022-08-23 DIAGNOSIS — I5022 Chronic systolic (congestive) heart failure: Secondary | ICD-10-CM | POA: Diagnosis present

## 2022-08-23 DIAGNOSIS — Z7189 Other specified counseling: Secondary | ICD-10-CM | POA: Diagnosis not present

## 2022-08-23 DIAGNOSIS — M546 Pain in thoracic spine: Secondary | ICD-10-CM | POA: Diagnosis not present

## 2022-08-23 DIAGNOSIS — F32A Depression, unspecified: Secondary | ICD-10-CM | POA: Diagnosis present

## 2022-08-23 DIAGNOSIS — D62 Acute posthemorrhagic anemia: Secondary | ICD-10-CM | POA: Diagnosis not present

## 2022-08-23 DIAGNOSIS — R4589 Other symptoms and signs involving emotional state: Secondary | ICD-10-CM | POA: Diagnosis not present

## 2022-08-23 DIAGNOSIS — G40909 Epilepsy, unspecified, not intractable, without status epilepticus: Secondary | ICD-10-CM | POA: Diagnosis present

## 2022-08-23 DIAGNOSIS — E876 Hypokalemia: Secondary | ICD-10-CM | POA: Diagnosis not present

## 2022-08-23 DIAGNOSIS — M868X7 Other osteomyelitis, ankle and foot: Secondary | ICD-10-CM | POA: Diagnosis present

## 2022-08-23 DIAGNOSIS — I110 Hypertensive heart disease with heart failure: Secondary | ICD-10-CM | POA: Diagnosis present

## 2022-08-23 DIAGNOSIS — G928 Other toxic encephalopathy: Secondary | ICD-10-CM | POA: Diagnosis not present

## 2022-08-23 DIAGNOSIS — E11628 Type 2 diabetes mellitus with other skin complications: Secondary | ICD-10-CM | POA: Diagnosis not present

## 2022-08-23 DIAGNOSIS — M4854XA Collapsed vertebra, not elsewhere classified, thoracic region, initial encounter for fracture: Secondary | ICD-10-CM | POA: Diagnosis present

## 2022-08-23 DIAGNOSIS — E162 Hypoglycemia, unspecified: Secondary | ICD-10-CM | POA: Diagnosis present

## 2022-08-23 DIAGNOSIS — Y712 Prosthetic and other implants, materials and accessory cardiovascular devices associated with adverse incidents: Secondary | ICD-10-CM | POA: Diagnosis not present

## 2022-08-23 DIAGNOSIS — E1169 Type 2 diabetes mellitus with other specified complication: Secondary | ICD-10-CM

## 2022-08-23 DIAGNOSIS — I700 Atherosclerosis of aorta: Secondary | ICD-10-CM | POA: Diagnosis present

## 2022-08-23 DIAGNOSIS — I4819 Other persistent atrial fibrillation: Secondary | ICD-10-CM | POA: Diagnosis not present

## 2022-08-23 DIAGNOSIS — M4646 Discitis, unspecified, lumbar region: Secondary | ICD-10-CM | POA: Diagnosis not present

## 2022-08-23 DIAGNOSIS — B9562 Methicillin resistant Staphylococcus aureus infection as the cause of diseases classified elsewhere: Secondary | ICD-10-CM | POA: Diagnosis not present

## 2022-08-23 DIAGNOSIS — M8448XA Pathological fracture, other site, initial encounter for fracture: Secondary | ICD-10-CM | POA: Diagnosis not present

## 2022-08-23 DIAGNOSIS — E119 Type 2 diabetes mellitus without complications: Secondary | ICD-10-CM | POA: Diagnosis not present

## 2022-08-23 DIAGNOSIS — I959 Hypotension, unspecified: Secondary | ICD-10-CM | POA: Diagnosis present

## 2022-08-23 DIAGNOSIS — Z66 Do not resuscitate: Secondary | ICD-10-CM | POA: Diagnosis not present

## 2022-08-23 DIAGNOSIS — M4644 Discitis, unspecified, thoracic region: Secondary | ICD-10-CM | POA: Diagnosis not present

## 2022-08-23 DIAGNOSIS — R7881 Bacteremia: Secondary | ICD-10-CM | POA: Diagnosis present

## 2022-08-23 DIAGNOSIS — M869 Osteomyelitis, unspecified: Secondary | ICD-10-CM | POA: Diagnosis not present

## 2022-08-23 DIAGNOSIS — T82514A Breakdown (mechanical) of infusion catheter, initial encounter: Secondary | ICD-10-CM | POA: Diagnosis not present

## 2022-08-23 LAB — BASIC METABOLIC PANEL
Anion gap: 9 (ref 5–15)
BUN: 16 mg/dL (ref 6–20)
CO2: 21 mmol/L — ABNORMAL LOW (ref 22–32)
Calcium: 8.8 mg/dL — ABNORMAL LOW (ref 8.9–10.3)
Chloride: 99 mmol/L (ref 98–111)
Creatinine, Ser: 1.26 mg/dL — ABNORMAL HIGH (ref 0.61–1.24)
GFR, Estimated: >60 mL/min (ref >60)
Glucose, Bld: 164 mg/dL — ABNORMAL HIGH (ref 70–99)
Potassium: 3.9 mmol/L (ref 3.5–5.1)
Sodium: 129 mmol/L — ABNORMAL LOW (ref 135–145)

## 2022-08-23 LAB — CBC
HCT: 34 % — ABNORMAL LOW (ref 39.0–52.0)
Hemoglobin: 11 g/dL — ABNORMAL LOW (ref 13.0–17.0)
MCH: 29.3 pg (ref 26.0–34.0)
MCHC: 32.4 g/dL (ref 30.0–36.0)
MCV: 90.7 fL (ref 80.0–100.0)
Platelets: 325 10*3/uL (ref 150–400)
RBC: 3.75 MIL/uL — ABNORMAL LOW (ref 4.22–5.81)
RDW: 14.7 % (ref 11.5–15.5)
WBC: 9.8 10*3/uL (ref 4.0–10.5)
nRBC: 0 % (ref 0.0–0.2)

## 2022-08-23 LAB — GLUCOSE, CAPILLARY
Glucose-Capillary: 188 mg/dL — ABNORMAL HIGH (ref 70–99)
Glucose-Capillary: 204 mg/dL — ABNORMAL HIGH (ref 70–99)
Glucose-Capillary: 192 mg/dL — ABNORMAL HIGH (ref 70–99)
Glucose-Capillary: 178 mg/dL — ABNORMAL HIGH (ref 70–99)
Glucose-Capillary: 155 mg/dL — ABNORMAL HIGH (ref 70–99)
Glucose-Capillary: 132 mg/dL — ABNORMAL HIGH (ref 70–99)

## 2022-08-23 LAB — MAGNESIUM: Magnesium: 1.9 mg/dL (ref 1.7–2.4)

## 2022-08-23 LAB — APTT: aPTT: >200 seconds (ref 24–36)

## 2022-08-23 MED ORDER — HEPARIN BOLUS VIA INFUSION
4000.0000 [IU] | Freq: Once | INTRAVENOUS | Status: AC
  Administered 2022-08-23: 4000 [IU] via INTRAVENOUS
  Filled 2022-08-23: qty 4000

## 2022-08-23 MED ORDER — HEPARIN (PORCINE) 25000 UT/250ML-% IV SOLN
2000.0000 [IU]/h | INTRAVENOUS | Status: DC
  Administered 2022-08-23: 2000 [IU]/h via INTRAVENOUS
  Filled 2022-08-23: qty 250

## 2022-08-23 MED ORDER — VANCOMYCIN HCL 1500 MG/300ML IV SOLN
1500.0000 mg | Freq: Once | INTRAVENOUS | Status: AC
  Administered 2022-08-23: 1500 mg via INTRAVENOUS
  Filled 2022-08-23: qty 300

## 2022-08-23 MED ORDER — SODIUM CHLORIDE 0.9 % IV SOLN: INTRAVENOUS | Status: DC

## 2022-08-23 MED ORDER — VANCOMYCIN HCL 750 MG/150ML IV SOLN
750.0000 mg | Freq: Two times a day (BID) | INTRAVENOUS | Status: DC
  Administered 2022-08-24 – 2022-08-26 (×6): 750 mg via INTRAVENOUS
  Filled 2022-08-23 (×7): qty 150

## 2022-08-23 MED ORDER — OXYCODONE HCL 5 MG PO TABS
5.0000 mg | ORAL_TABLET | ORAL | Status: DC | PRN
  Administered 2022-08-23 – 2022-08-25 (×7): 10 mg via ORAL
  Filled 2022-08-23 (×7): qty 2

## 2022-08-23 MED ORDER — HEPARIN (PORCINE) 25000 UT/250ML-% IV SOLN
1500.0000 [IU]/h | INTRAVENOUS | Status: DC
  Administered 2022-08-23: 1250 [IU]/h via INTRAVENOUS
  Administered 2022-08-24: 1050 [IU]/h via INTRAVENOUS
  Administered 2022-08-25 – 2022-08-26 (×2): 950 [IU]/h via INTRAVENOUS
  Administered 2022-08-28: 1150 [IU]/h via INTRAVENOUS
  Administered 2022-08-29: 1500 [IU]/h via INTRAVENOUS
  Administered 2022-08-29: 1150 [IU]/h via INTRAVENOUS
  Administered 2022-08-30: 1500 [IU]/h via INTRAVENOUS
  Filled 2022-08-23 (×8): qty 250

## 2022-08-23 NOTE — Progress Notes (Signed)
PT Cancellation Note  Patient Details Name: David [name] MRN: [id number] DOB: 1960-09-02   Cancelled Treatment:    Reason Eval/Treat Not Completed: Medical issues which prohibited therapy, to transfer to [facility] due to abnormal MRI findings. Blanchard Kelch PT Acute Rehabilitation Services Office [phone] Weekend [phone]   Rada Hay 08/23/2022, 7:40 AM

## 2022-08-23 NOTE — Consult Note (Signed)
[facility]    Date of Admission:  08/20/2022           Reason for Consult: Vertebral infection    Principal Problem:   Hypoglycemia Active Problems:   Essential hypertension   Insulin dependent type 2 diabetes mellitus (HCC)   Persistent atrial fibrillation (HCC)   Pure hypercholesterolemia   Cellulitis in diabetic foot (HCC)   Diskitis   Assessment: 62 year old male admitted with: #Recent history of MRSA bacteremia likely from foot status post fifth ray amputation with cultures growing MRSA and actinomyces complicated by thoracic osteomyelitis and vertebral abscess/phlegmon - Presented from home via EMS after he stated he took too many opiates.  He stated that he did not time that well.  CT on arrival showed proximal aggressive discitis/osteomyelitis.  Recommended MRI T spine for further characterization. -Patient has completed above 5 weeks antibiotics of vancomycin and then Dapto and transition to doxycycline alone on 5/15. - Repeat MRI on 5/20 showed progressive discitis/osteomyelitis at T7-T9, partial collapse of vertebral body 40% body with height loss at T8, 10 ventral epidural phlegmon T7-T9, extensive phlegmonous changes the paravertebral soft tissue additionally involve the mid thoracic levels.  Bilateral pleural effusions complex on the right. - Neurosurgery engaged tentatively have him on the schedule for tomorrow, Eliquis needs 3 to 5 days before having surgery.  Possible stabilization surgery in the form of pedicle screw fixation T5-T6 down to T11. Recommendations:  -Start vancomycin - Continue doxycycline - Follow neurosurgery recommendations.  Please obtain cultures in the OR.  Microbiology:   Antibiotics: Doxycyline 5/9- Dapto 4/13-14, 4/21-22, 5/9-15 Vanco4/9-4/11, 4/23-5/8 Oritavacin 4/15  HPI: David [name] is a 62 y.o. male systolic heart failure, persistent A-fib on Eliquis, former, type 2 diabetes, hypertension,  hyperlipidemia, recent admission for MRSA bacteremia due to osteomyelitis of the foot seeding the T-spine with discitis/osteomyelitis concerning for multilevel loculated prevertebral abscess, large right pleural empyema noted on fifth ray amputation, treated with IV antibiotics vancomycin/daptomycin for about 5 weeks and discharged on doxycycline as OR cultures from 4/33 revision amputation grew actinomyces as well as MRSA.  Patient presented via EMS after brought in for hypoglycemia, concern for opiate overdose given naloxone.  ID was engaged given recent hospitalization for MRSA bacteremia, patient on doxycycline.  CT showed discitis/osteomyelitis with progressive destructive findings.  MRI thoracic spine recommended.  Review of Systems: ROS  Past Medical History:  Diagnosis Date   Cardiomyopathy (HCC)    a. EF 45% in 2019.   CHF (congestive heart failure) (HCC)    Chronic anticoagulation 09/30/2017   Diabetes mellitus type 2 in nonobese [facility])    Does not have health insurance    Essential hypertension 09/26/2017   Financial difficulties    H/O noncompliance with medical treatment, presenting hazards to health    Non-insulin treated type 2 diabetes mellitus (HCC) 09/26/2017   Persistent atrial fibrillation (HCC)    Sleep apnea suspected 09/30/2017    Social History   Tobacco Use   Smoking status: Former    Years: 15    Types: Cigarettes    Quit date: 03/18/2018    Years since quitting: 4.4   Smokeless tobacco: Never   Tobacco comments:    09/26/2017 "2-3 cigarettes/month now"  Vaping Use   Vaping Use: Never used  Substance Use Topics   Alcohol use: Not Currently    Alcohol/week: 3.0 standard drinks of alcohol    Types: 3 Cans of beer per week  Comment: States he quit drinking 8-9 months ago   Drug use: Never    Family History  Problem Relation Age of Onset   Hypertension Mother    Scheduled Meds:  acetaminophen  1,000 mg Oral TID   atorvastatin  80 mg Oral Daily    busPIRone  15 mg Oral BID   carvedilol  6.25 mg Oral BID   Chlorhexidine Gluconate Cloth  6 each Topical Daily   digoxin  125 mcg Oral Daily   famotidine  20 mg Oral BID   fluticasone  1 spray Each Nare Daily   furosemide  20 mg Oral Daily   insulin aspart  0-5 Units Subcutaneous QHS   insulin aspart  0-9 Units Subcutaneous TID WC   lidocaine  1 patch Transdermal Q24H   methocarbamol  500 mg Oral QID   morphine  30 mg Oral Q12H   pantoprazole  40 mg Oral Daily   polyethylene glycol  17 g Oral BID   pregabalin  50 mg Oral TID   sacubitril-valsartan  1 tablet Oral BID   Continuous Infusions:  sodium chloride     heparin 2,000 Units/hr (08/23/22 1334)   vancomycin     PRN Meds:.HYDROmorphone (DILAUDID) injection, ondansetron **OR** ondansetron (ZOFRAN) IV, mouth rinse, oxyCODONE No Known Allergies  OBJECTIVE: Blood pressure 101/76, pulse 64, temperature 98.4 F (36.9 C), temperature source Oral, resp. rate (!) 25, height 6' (1.829 m), weight 77.8 kg, SpO2 94 %.  Physical Exam Constitutional:      General: He is not in acute distress.    Appearance: He is normal weight. He is not toxic-appearing.  HENT:     Head: Normocephalic and atraumatic.     Right Ear: External ear normal.     Left Ear: External ear normal.     Nose: No congestion or rhinorrhea.     Mouth/Throat:     Mouth: Mucous membranes are moist.     Pharynx: Oropharynx is clear.  Eyes:     Extraocular Movements: Extraocular movements intact.     Conjunctiva/sclera: Conjunctivae normal.     Pupils: Pupils are equal, round, and reactive to light.  Cardiovascular:     Rate and Rhythm: Normal rate and regular rhythm.     Heart sounds: No murmur heard.    No friction rub. No gallop.  Pulmonary:     Effort: Pulmonary effort is normal.     Breath sounds: Normal breath sounds.  Abdominal:     General: Abdomen is flat. Bowel sounds are normal.     Palpations: Abdomen is soft.  Musculoskeletal:        General: No  swelling.     Cervical back: Normal range of motion and neck supple.  Skin:    General: Skin is warm and dry.  Neurological:     General: No focal deficit present.     Mental Status: He is oriented to person, place, and time.  Psychiatric:        Mood and Affect: Mood normal.   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  Lab Results Lab Results  Component Value Date   WBC 9.8 08/23/2022   HGB 11.0 (L) 08/23/2022   HCT 34.0 (L) 08/23/2022   MCV 90.7 08/23/2022   PLT 325 08/23/2022    Lab Results  Component Value Date   CREATININE 1.26 (H) 08/23/2022   BUN 16 08/23/2022   NA 129 (L) 08/23/2022   K 3.9 08/23/2022   CL 99 08/23/2022   CO2  21 (L) 08/23/2022    Lab Results  Component Value Date   ALT 13 08/21/2022   AST 11 (L) 08/21/2022   ALKPHOS 82 08/21/2022   BILITOT 0.6 08/21/2022       Danelle Earthly, MD [facility] 08/23/2022, 1:39 PM   I have personally spent 82 minutes involved in face-to-face and non-face-to-face activities for this patient on the day of the visit. Professional time spent includes the following activities: Preparing to see the patient (review of tests), Obtaining and/or reviewing separately obtained history (admission/discharge record), Performing a medically appropriate examination and/or evaluation , Ordering medications/tests/procedures, referring and communicating with other health care professionals, Documenting clinical information in the EMR, Independently interpreting results (not separately reported), Communicating results to the patient/family/caregiver, Counseling and educating the patient/family/caregiver and Care coordination (not separately reported).

## 2022-08-23 NOTE — Progress Notes (Signed)
ANTICOAGULATION CONSULT NOTE  Pharmacy Consult for Heparin Indication: atrial fibrillation  No Known Allergies  Patient Measurements: Height: 6' (182.9 cm) Weight: 77.8 kg (171 lb 8.3 oz) IBW/kg (Calculated) : 77.6 Heparin Dosing Weight:  77.8 kg  Vital Signs: Temp: 98.2 F (36.8 C) (05/21 1917) Temp Source: Oral (05/21 1917) BP: 126/79 (05/21 1917) Pulse Rate: 74 (05/21 1600)  Labs: Recent Labs    08/21/22 0256 08/22/22 0313 08/23/22 0326 08/23/22 1757  HGB 9.4* 10.9* 11.0*  --   HCT 28.3* 33.9* 34.0*  --   PLT 309 345 325  --   APTT  --   --   --  >200*  CREATININE 1.24 1.20 1.26*  --      Estimated Creatinine Clearance: 68.4 mL/min (A) (by C-G formula based on SCr of 1.26 mg/dL (H)).   Medical History: Past Medical History:  Diagnosis Date   Cardiomyopathy (HCC)    a. EF 45% in 2019.   CHF (congestive heart failure) (HCC)    Chronic anticoagulation 09/30/2017   Diabetes mellitus type 2 in nonobese [name])    Does not have health insurance    Essential hypertension 09/26/2017   Financial difficulties    H/O noncompliance with medical treatment, presenting hazards to health    Non-insulin treated type 2 diabetes mellitus (HCC) 09/26/2017   Persistent atrial fibrillation (HCC)    Sleep apnea suspected 09/30/2017    Assessment: Active Problem(s): Just discharged from [facility] 5/17 now returns with Hypoglycemia 38, HTN, Afib  AC/Heme: hx afib on Eliquis 5 mg bid for afib (LD 5/20 PM)  >IV heparin 5/21 for neurosurgery plans.  - Hgb 11 stable, Plts WNL - CHA2DS2-VASc Score of at least 4.   Today, 5/21 - aPTT > 200 supratherapeutic on heparin infusion at 2000 units/hr  Goal of Therapy:  aPTT 66-102 seconds Hep level 0.3-0.5 Monitor platelets by anticoagulation protocol: Yes   Plan:  -Hold heparin infusion ~ 1 hr, then resume at 1250 units/hr -Check aPTT in 6-8 hrs -Daily HL, aPTT until correlating + CBC -Monitor closely for any signs/symptoms of  bleeding   Pricilla Riffle, PharmD, BCPS Clinical Pharmacist 08/23/2022 7:27 PM

## 2022-08-23 NOTE — Progress Notes (Signed)
PROGRESS NOTE    David [name]  ION:[id number] DOB: March 26, 1961 DOA: 08/20/2022 PCP: [name], MD    Brief Narrative:   David [name] is a 62 y.o. 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  His significant other initially thought that he may have developed an opiate overdose and gave him naloxone.  EMS initial CBG was 38 and gave him a anion Polen dextrose 50% with a follow-up CBG 195 mg/dL.  However, the patient has had at least 2 hypoglycemic CBG measurements since arriving to the emergency department.  The patient stated that he last ate yesterday evening.  He does not remember how much insulin he gave himself earlier.  Appears to have been taking too much pain medication at home.  Blood sugars better but pain continues to an issue   Assessment and Plan: Hypoglycemia In the setting of:   Insulin dependent type 2 diabetes mellitus (HCC) -stop d5 gtt-- continue to monitor CBG -advance diet as tolerated -SSI  Discitis  CT Scan: Discitis-osteomyelitis at T7-T8-T9, with progressive bony destructive findings, including substantial collapse of T8 and 8 mm of posterior bony retropulsion narrowing the AP diameter of the thecal sac down to 0.4 cm, compatible with prominent central narrowing of the thecal sac. -ESR/CRP done - NS--  MRI thoracic w/wo contrast- transfer to [facility] for possible surgery next week (eliquis on hold-- heparin started) - ID who will see patient -started on vanc  Acute on chronic pain -resume home regimen of long acting -add scheduled muscle relaxer  -lidocaine patch -schedule tylenol -pain limiting  movement    Essential hypertension Continue carvedilol 6.25 mg p.o. twice daily     Persistent atrial fibrillation (HCC) CHA2DS2-VASc Score of at least 4. Continue apixaban 5 mg p.o. twice daily. Continue carvedilol 6.25 mg p.o. twice daily. -appears to be schedule for a cardioversion- elective-- cardiology notified of hospitalization     Pure hypercholesterolemia Continue atorvastatin 80 mg p.o. daily.   Small/moderate pleural effusion -monitor -may need thoracentesis vs CVTS involvement if loculated -pulm toilet    PT eval   DVT prophylaxis:     Code Status: Full Code   Disposition Plan:  Level of care: Progressive Status is: Observation The patient will require care spanning > 2 midnights and should be moved to inpatient    Consultants:  ID Thedore Mins) NS Yetta Barre)   Subjective: Had some confusion overnight  C/o pain as soon as he is woken up  Objective: Vitals:   08/23/22 1002 08/23/22 1100 08/23/22 1104 08/23/22 1156  BP: 102/69  99/76   Pulse: 76 74 91 80  Resp: 15 16 19  (!) 22  Temp:   98.4 F (36.9 C)   TempSrc:   Oral   SpO2: 98% 98% 99% 100%  Weight:      Height:        Intake/Output Summary (Last 24 hours) at 08/23/2022 1208 Last data filed at 08/23/2022 1006 Gross per 24 hour  Intake 360 ml  Output 700 ml  Net -340 ml   Filed Weights   08/20/22 1821  Weight: 77.8 kg    Examination:   General: Appearance:    Well developed, well nourished male in no acute distress  Lungs:     respirations unlabored  Heart:    Normal heart rate. .       Neurologic:   Awake, alert, moves all 4 ext       Data Reviewed: I have personally reviewed following labs and imaging studies  CBC: Recent Labs  Lab 08/17/22 0739 08/18/22 0343 08/20/22 1614 08/21/22 0256 08/22/22 0313 08/23/22 0326  WBC 11.8* 8.5 11.4* 9.1 9.3 9.8  NEUTROABS 8.8* 5.9 9.5*  --   --   --   HGB 10.2* 9.6* 10.8* 9.4* 10.9* 11.0*  HCT 32.0* 29.6* 32.5* 28.3* 33.9* 34.0*   MCV 90.4 88.9 86.4 86.5 88.7 90.7  PLT 405* 361 392 309 345 325   Basic Metabolic Panel: Recent Labs  Lab 08/17/22 0733 08/17/22 0739 08/18/22 0343 08/20/22 1614 08/21/22 0256 08/22/22 0313 08/23/22 0326  NA 130*  --   --  129* 128* 133* 129*  K 4.4  --   --  2.8* 4.0 4.1 3.9  CL 100  --   --  96* 98 102 99  CO2 21*  --   --  23 22 22  21*  GLUCOSE 118*  --   --  49* 264* 158* 164*  BUN 15  --   --  15 17 15 16   CREATININE 1.33*  --   --  1.20 1.24 1.20 1.26*  CALCIUM 9.5  --   --  9.5 8.5* 9.0 8.8*  MG  --  1.8 1.8  --  1.5*  --  1.9  PHOS  --  3.6 3.1  --   --   --   --    GFR: Estimated Creatinine Clearance: 68.4 mL/min (A) (by C-G formula based on SCr of 1.26 mg/dL (H)). Liver Function Tests: Recent Labs  Lab 08/20/22 1614 08/21/22 0256  AST 14* 11*  ALT 15 13  ALKPHOS 99 82  BILITOT 0.9 0.6  PROT 8.5* 7.0  ALBUMIN 2.8* 2.3*   No results for input(s): "LIPASE", "AMYLASE" in the last 168 hours. No results for input(s): "AMMONIA" in the last 168 hours. Coagulation Profile: No results for input(s): "INR", "PROTIME" in the last 168 hours. Cardiac Enzymes: No results for input(s): "CKTOTAL", "CKMB", "CKMBINDEX", "TROPONINI" in the last 168 hours. BNP (last 3 results) No results for input(s): "PROBNP" in the last 8760 hours. HbA1C: No results for input(s): "HGBA1C" in the last 72 hours. CBG: Recent Labs  Lab 08/22/22 1623 08/22/22 1927 08/22/22 2126 08/23/22 0811 08/23/22 1101  GLUCAP 171* 180* 172* 188* 204*   Lipid Profile: No results for input(s): "CHOL", "HDL", "LDLCALC", "TRIG", "CHOLHDL", "LDLDIRECT" in the last 72 hours. Thyroid Function Tests: No results for input(s): "TSH", "T4TOTAL", "FREET4", "T3FREE", "THYROIDAB" in the last 72 hours. Anemia Panel: No results for input(s): "VITAMINB12", "FOLATE", "FERRITIN", "TIBC", "IRON", "RETICCTPCT" in the last 72 hours. Sepsis Labs: No results for input(s): "PROCALCITON", "LATICACIDVEN" in the last 168  hours.   Recent Results (from the past 240 hour(s))  MRSA Next Gen by PCR, Nasal     Status: Abnormal   Collection Time: 08/20/22  6:16 PM   Specimen: Nasal Mucosa; Nasal Swab  Result Value Ref Range Status   MRSA by PCR Next Gen DETECTED (A) NOT DETECTED Final    Comment: RESULT CALLED TO, READ BACK BY AND VERIFIED WITH: HARRIS,D AT 2208 ON 08/20/22 BY LUZOLOP (NOTE) The GeneXpert MRSA Assay (FDA approved for NASAL specimens only), is one component of a comprehensive MRSA colonization surveillance program. It is not  intended to diagnose MRSA infection nor to guide or monitor treatment for MRSA infections. Test performance is not FDA approved in patients less than 57 years old. Performed at [facility], [address], [place], [place] [address]          Radiology Studies: MR THORACIC SPINE W WO CONTRAST  Result Date: 08/22/2022 CLINICAL DATA:  Back pain.  Thoracic discitis osteomyelitis. EXAM: MRI THORACIC WITHOUT AND WITH CONTRAST TECHNIQUE: Multiplanar and multiecho pulse sequences of the thoracic spine were obtained without and with intravenous contrast. CONTRAST:  8mL GADAVIST GADOBUTROL 1 MMOL/ML IV SOLN COMPARISON:  08/08/2022 FINDINGS: Alignment:  No traumatic listhesis. Vertebrae: Findings of ongoing, progressive discitis osteomyelitis at the T7-8 and T8-9 levels. Abnormal fluid signal within the disc spaces with intense bone marrow edema and confluent low T1 signal changes throughout the T7, T8, and T9 vertebral bodies. Partial collapse of the T8 vertebral body with 40% vertebral body height loss and approximately 6 mm of bony retropulsion at the inferior endplate. Mild collapse of the superior endplate of T9 with approximately 25% vertebral body height loss. No retropulsion of the T9 vertebrae. Remaining thoracic vertebral bodies are unchanged in appearance. Cord: There may be mild cord edema at the T8-9 level, evaluation is somewhat degraded by motion  artifact on the axial sequences. Thin enhancing fluid within the ventral epidural space spanning from T7 to T9, likely phlegmon. No organized epidural abscess. Paraspinal and other soft tissues: Extensive phlegmonous changes in the paravertebral soft tissues adjacent to the involved midthoracic levels. Bilateral pleural effusions, complex on the right, better characterized by same day CT chest. Disc levels: Moderate canal stenosis at the T8-9 level secondary to retropulsed T8 vertebral body. This there is high-grade bilateral foraminal stenosis at T8-T9. Remaining levels are unchanged. No additional site of canal stenosis. IMPRESSION: 1. Findings of ongoing, progressive discitis-osteomyelitis at the T7-8 and T8-9 levels. Partial collapse of the T8 vertebral body with 40% vertebral body height loss and approximately 6 mm of bony retropulsion at the inferior endplate of T8. 2. Mild collapse of the superior endplate of T9 with approximately 25% vertebral body height loss. 3. Moderate canal stenosis at the T8-9 level secondary to retropulsed T8 vertebral body. There may be mild cord edema at the T8-9 level, evaluation is somewhat degraded by motion artifact on the axial sequences. 4. Thin ventral epidural phlegmon at the T7 to T9 level. 5. Extensive phlegmonous changes in the paravertebral soft tissues adjacent to the involved midthoracic levels. No organized epidural abscess. 6. Bilateral pleural effusions, complex on the right, better characterized by same day CT chest. These results will be called to the ordering clinician or representative by the Radiologist Assistant, and communication documented in the PACS or [employer]. Electronically Signed   By: Duanne Guess D.O.   On: 08/22/2022 18:36   CT Angio Chest Pulmonary Embolism (PE) W or WO Contrast  Result Date: 08/22/2022 CLINICAL DATA:  Heart failure and diabetes. MRSA bacteremia. Clinical suspicion for pulmonary embolus. EXAM: CT ANGIOGRAPHY CHEST  WITH CONTRAST TECHNIQUE: Multidetector CT imaging of the chest was performed using the standard protocol during bolus administration of intravenous contrast. Multiplanar CT image reconstructions and MIPs were obtained to evaluate the vascular anatomy. RADIATION DOSE REDUCTION: This exam was performed according to the departmental dose-optimization program which includes automated exposure control, adjustment of the mA and/or kV according to patient size and/or use of iterative reconstruction technique. CONTRAST:  OMNIPAQUE IOHEXOL 350 MG/ML SOLN COMPARISON:  07/12/2022  FINDINGS: Cardiovascular: No filling defect is identified in the pulmonary arterial tree to suggest pulmonary embolus. Coronary, aortic arch, and branch vessel atherosclerotic vascular disease. Mild cardiomegaly. Faint mitral valve calcification. Mediastinum/Nodes: 0.9 cm right paratracheal lymph node on image 30 series 6. Small distal paraesophageal lymph nodes including a 0.7 cm lymph node on image 65 series 6. No overt pathologic adenopathy. Lungs/Pleura: Small left and small to moderate right pleural effusion. There is some loculation of the right pleural effusion along the major fissure. Passive atelectasis noted in addition to scattered mild atelectasis for example in the lingula. Upper Abdomen: Unremarkable Musculoskeletal: Discitis-osteomyelitis at T7-T8-T9 noted, with cortical bony destructive findings, hypodensity below the remaining endplates, subsidence of the superior endplate of T9 with 40% loss of vertebral height, substantial collapse of T8 with 60% loss of vertebral height, and early bony destructive findings along the anterior inferior endplate of T7. At the T8 vertebral level there is 8 mm of posterior bony retropulsion narrowing the AP diameter of the thecal sac down to about 0.4 cm, compatible with prominent central narrowing of the thecal sac. The appearance is substantially new from 07/12/2022 and is progressive/worsened  compared to 08/08/2022. Chronic endplate compressions at T3, no change from 07/12/2022. Review of the MIP images confirms the above findings. IMPRESSION: 1. No filling defect is identified in the pulmonary arterial tree to suggest pulmonary embolus. 2. Discitis-osteomyelitis at T7-T8-T9, with progressive bony destructive findings, including substantial collapse of T8 and 8 mm of posterior bony retropulsion narrowing the AP diameter of the thecal sac down to 0.4 cm, compatible with prominent central narrowing of the thecal sac. 3. Small left and small to moderate right pleural effusion with some loculation of the right pleural effusion along the major fissure. Passive atelectasis noted in addition to scattered mild atelectasis in the lungs. 4. Mild cardiomegaly. Faint mitral valve calcification. 5. Coronary, aortic arch, and branch vessel atherosclerotic vascular disease. 6. Chronic endplate compressions at T3, no change from 07/12/2022. 7. Aortic and coronary atherosclerosis. Aortic Atherosclerosis (ICD10-I70.0). Electronically Signed   By: Gaylyn Rong M.D.   On: 08/22/2022 12:07        Scheduled Meds:  acetaminophen  1,000 mg Oral TID   atorvastatin  80 mg Oral Daily   busPIRone  15 mg Oral BID   carvedilol  6.25 mg Oral BID   Chlorhexidine Gluconate Cloth  6 each Topical Daily   digoxin  125 mcg Oral Daily   famotidine  20 mg Oral BID   fluticasone  1 spray Each Nare Daily   furosemide  20 mg Oral Daily   insulin aspart  0-5 Units Subcutaneous QHS   insulin aspart  0-9 Units Subcutaneous TID WC   lidocaine  1 patch Transdermal Q24H   methocarbamol  500 mg Oral QID   morphine  30 mg Oral Q12H   pantoprazole  40 mg Oral Daily   polyethylene glycol  17 g Oral BID   pregabalin  50 mg Oral TID   sacubitril-valsartan  1 tablet Oral BID   Continuous Infusions:  heparin 2,000 Units/hr (08/23/22 0954)   vancomycin 1,500 mg (08/23/22 1106)   vancomycin       LOS: 0 days    Time  spent: 45 minutes spent on chart review, discussion with nursing staff, consultants, updating family and interview/physical exam; more than 50% of that time was spent in counseling and/or coordination of care.    Joseph Art, DO [facility] Available via Epic secure chat 7am-7pm  After these hours, please refer to coverage provider listed on [email or web address] 08/23/2022, 12:08 PM

## 2022-08-23 NOTE — Progress Notes (Signed)
Patient been drowsy and when awaken moaning in 10/10pain, confused and hallucinating.  Asking for more pain meds and saying that he have not received his medication when this RN  just gave it to him. This RN redirected and  reeducated the patient several times and informed the [name]- [name] NP regarding patient condition.

## 2022-08-23 NOTE — Progress Notes (Signed)
Patient ID: David [name], male   DOB: 1961/02/06, 62 y.o.   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  I have reviewed his CT angiogram of the chest and I asked for MRI of the thoracic spine with and without contrast.  I have reviewed his MRI of the thoracic spine.  I have been texting with his physician.   He stopped Eliquis yesterday.  Prefer that they also hold the aspirin for surgery.  MRI and CT show continued evolution of his discitis osteomyelitis with now loss of vertebral body height at T8 and some retropulsion of the inferior endplate given him moderate canal narrowing but no obvious cord compression and no obvious signal change in the cord.  Signal change in the T7-T8 and T9 vertebral bodies consistent with osteomyelitis.  There is no epidural abscess.  I do think he could benefit from stabilizing surgery in the form of pedicle screw fixation from T5 or T6 down to T10 or T11 with a posterior decompression at T8.  Posterior decompression is not ideal but I would not recommend a thoracic corpectomy or a lateral approach, especially given the likely anatomical distortion from the infection..  I think the posterior decompression will be adequate.  Because of the Eliquis he needs 3 to 5 days before having surgery.  Tentatively had him on the schedule for tomorrow.  This will be canceled and we will probably plan on surgery next week if all are in agreement.  OR staffing issues will likely make it difficult to do the surgery by Friday.  Right now he is at another hospital and so I have not had a chance to speak with him as he has not been transferred here to [facility] yet.  I am in the office today and would likely see him in consultation tomorrow.

## 2022-08-23 NOTE — Progress Notes (Addendum)
Care link called and notified of patient needing transport to [facility] to [id number]. Report called by previous shift to [facility].   2207-care link emts here to take patient to [facility]. Updated emts to pass on to his nurse to ask MD to look at abdomen, distended, soft, hypo and distant bowel sounds.

## 2022-08-23 NOTE — Progress Notes (Signed)
ANTICOAGULATION CONSULT NOTE - Initial Consult  Pharmacy Consult for Heparin Indication: atrial fibrillation  No Known Allergies  Patient Measurements: Height: 6' (182.9 cm) Weight: 77.8 kg (171 lb 8.3 oz) IBW/kg (Calculated) : 77.6 Heparin Dosing Weight:  77.8 kg  Vital Signs: Temp: 98.5 F (36.9 C) (05/21 0408) Temp Source: Oral (05/21 0408) BP: 120/67 (05/21 0600) Pulse Rate: 82 (05/21 0600)  Labs: Recent Labs    08/21/22 0256 08/22/22 0313 08/23/22 0326  HGB 9.4* 10.9* 11.0*  HCT 28.3* 33.9* 34.0*  PLT 309 345 325  CREATININE 1.24 1.20 1.26*    Estimated Creatinine Clearance: 68.4 mL/min (A) (by C-G formula based on SCr of 1.26 mg/dL (H)).   Medical History: Past Medical History:  Diagnosis Date   Cardiomyopathy (HCC)    a. EF 45% in 2019.   CHF (congestive heart failure) (HCC)    Chronic anticoagulation 09/30/2017   Diabetes mellitus type 2 in nonobese [facility])    Does not have health insurance    Essential hypertension 09/26/2017   Financial difficulties    H/O noncompliance with medical treatment, presenting hazards to health    Non-insulin treated type 2 diabetes mellitus (HCC) 09/26/2017   Persistent atrial fibrillation (HCC)    Sleep apnea suspected 09/30/2017    Assessment: Active Problem(s): Just discharged from [facility] 5/17 now returns with Hypoglycemia 38, HTN, Afib  AC/Heme: hx afib on Eliquis 5 mg bid for afib (LD 5/20 PM)  >IV heparin 5/21 for neurosurgery plans.  - Hgb 11 stable, Plts WNL - CHA2DS2-VASc Score of at least 4.   Goal of Therapy:  aPTT 66-102 seconds Hep level 0.3-0.5 Monitor platelets by anticoagulation protocol: Yes   Plan:  Eliquis>5/21 to IV heparin Because of the Eliquis he needs 3 to 5 days before having surgery   IV heparin 4000 unit IV bolus Heparin infusion 2000 units/hr Check aPTT in 6-8 hrs Daily HL, aPTT until correlating + CBC.   Tatjana Turcott S. [name], PharmD, BCPS Clinical Staff  Pharmacist [email or web address] [name], Roshon Duell Stillinger 08/23/2022,8:14 AM

## 2022-08-23 NOTE — Progress Notes (Signed)
Pharmacy Antibiotic Note  David [name] is a 62 y.o. male admitted on 08/20/2022 with discitis/epidural abscess, hx prior disseminated MRSA infection. Pharmacy has been consulted for Vancomycin dosing.  Plan: - Start Vancomycin 1500 mg IV x 1 dose followed by 750 mg IV every 12 hours (eAUC 437, Vd 0.72, SCr 1.26) - Will continue to follow renal function, culture results, LOT, and antibiotic de-escalation plans   Height: 6' (182.9 cm) Weight: 77.8 kg (171 lb 8.3 oz) IBW/kg (Calculated) : 77.6  Temp (24hrs), Avg:98.2 F (36.8 C), Min:97.4 F (36.3 C), Max:99.3 F (37.4 C)  Recent Labs  Lab 08/17/22 0733 08/17/22 0739 08/18/22 0343 08/20/22 1614 08/21/22 0256 08/22/22 0313 08/23/22 0326  WBC  --    < > 8.5 11.4* 9.1 9.3 9.8  CREATININE 1.33*  --   --  1.20 1.24 1.20 1.26*   < > = values in this interval not displayed.    Estimated Creatinine Clearance: 68.4 mL/min (A) (by C-G formula based on SCr of 1.26 mg/dL (H)).    No Known Allergies  Thank you for allowing pharmacy to be a part of this patient's care.  Georgina Pillion, PharmD, BCPS Infectious Diseases Clinical Pharmacist 08/23/2022 9:32 AM   **Pharmacist phone directory can now be found on [email or web address] ([email or web address]).  Listed under [facility].

## 2022-08-24 ENCOUNTER — Inpatient Hospital Stay (HOSPITAL_COMMUNITY): Payer: Self-pay

## 2022-08-24 DIAGNOSIS — M4644 Discitis, unspecified, thoracic region: Secondary | ICD-10-CM | POA: Diagnosis not present

## 2022-08-24 DIAGNOSIS — Z8614 Personal history of Methicillin resistant Staphylococcus aureus infection: Secondary | ICD-10-CM | POA: Diagnosis not present

## 2022-08-24 DIAGNOSIS — E162 Hypoglycemia, unspecified: Secondary | ICD-10-CM

## 2022-08-24 DIAGNOSIS — E1169 Type 2 diabetes mellitus with other specified complication: Secondary | ICD-10-CM | POA: Diagnosis not present

## 2022-08-24 DIAGNOSIS — M4624 Osteomyelitis of vertebra, thoracic region: Secondary | ICD-10-CM | POA: Diagnosis not present

## 2022-08-24 LAB — CBC
HCT: 29.9 % — ABNORMAL LOW (ref 39.0–52.0)
Hemoglobin: 10 g/dL — ABNORMAL LOW (ref 13.0–17.0)
MCH: 28.5 pg (ref 26.0–34.0)
MCHC: 33.4 g/dL (ref 30.0–36.0)
MCV: 85.2 fL (ref 80.0–100.0)
Platelets: 321 10*3/uL (ref 150–400)
RBC: 3.51 MIL/uL — ABNORMAL LOW (ref 4.22–5.81)
RDW: 14.7 % (ref 11.5–15.5)
WBC: 10.2 10*3/uL (ref 4.0–10.5)
nRBC: 0 % (ref 0.0–0.2)

## 2022-08-24 LAB — GLUCOSE, CAPILLARY
Glucose-Capillary: 175 mg/dL — ABNORMAL HIGH (ref 70–99)
Glucose-Capillary: 140 mg/dL — ABNORMAL HIGH (ref 70–99)
Glucose-Capillary: 133 mg/dL — ABNORMAL HIGH (ref 70–99)
Glucose-Capillary: 121 mg/dL — ABNORMAL HIGH (ref 70–99)

## 2022-08-24 LAB — APTT
aPTT: 127 seconds — ABNORMAL HIGH (ref 24–36)
aPTT: 105 seconds — ABNORMAL HIGH (ref 24–36)

## 2022-08-24 LAB — HEPARIN LEVEL (UNFRACTIONATED)
Heparin Unfractionated: >1.1 IU/mL — ABNORMAL HIGH (ref 0.30–0.70)
Heparin Unfractionated: >1.1 IU/mL — ABNORMAL HIGH (ref 0.30–0.70)

## 2022-08-24 SURGERY — CARDIOVERSION
Anesthesia: General

## 2022-08-24 MED ORDER — HYDROMORPHONE HCL 1 MG/ML IJ SOLN
1.0000 mg | INTRAMUSCULAR | Status: DC | PRN
  Administered 2022-08-24 – 2022-08-26 (×25): 1 mg via INTRAVENOUS
  Filled 2022-08-24 (×25): qty 1

## 2022-08-24 MED ORDER — SENNOSIDES-DOCUSATE SODIUM 8.6-50 MG PO TABS
1.0000 | ORAL_TABLET | Freq: Two times a day (BID) | ORAL | Status: DC
  Administered 2022-08-24 – 2022-10-27 (×105): 1 via ORAL
  Filled 2022-08-24 (×119): qty 1

## 2022-08-24 MED ORDER — HYDROMORPHONE HCL 1 MG/ML IJ SOLN
1.0000 mg | Freq: Once | INTRAMUSCULAR | Status: AC
  Administered 2022-08-24: 1 mg via INTRAVENOUS
  Filled 2022-08-24: qty 1

## 2022-08-24 MED ORDER — NALOXONE HCL 0.4 MG/ML IJ SOLN: 0.4000 mg | INTRAMUSCULAR | Status: DC | PRN

## 2022-08-24 NOTE — Progress Notes (Addendum)
ANTICOAGULATION CONSULT NOTE - Follow Up Consult  Pharmacy Consult for Heparin (Apixaban on HOLD) Indication: atrial fibrillation  No Known Allergies  Patient Measurements: Height: 6' (182.9 cm) Weight: 83.3 kg (183 lb 10.3 oz) IBW/kg (Calculated) : 77.6 Heparin Dosing Weight: 77.8 kg  Vital Signs: Temp: 98.3 F (36.8 C) (05/22 0807) Temp Source: Oral (05/22 0807) BP: 139/78 (05/22 0807) Pulse Rate: 92 (05/22 0807)  Labs: Recent Labs    08/22/22 0313 08/23/22 0326 08/23/22 1757 08/24/22 0255 08/24/22 1215  HGB 10.9* 11.0*  --  10.0*  --   HCT 33.9* 34.0*  --  29.9*  --   PLT 345 325  --  321  --   APTT  --   --  >200* 127* 105*  HEPARINUNFRC  --   --   --  >1.10* >1.10*  CREATININE 1.20 1.26*  --   --   --     Estimated Creatinine Clearance: 68.4 mL/min (A) (by C-G formula based on SCr of 1.26 mg/dL (H)).   Medications:  Scheduled:   acetaminophen  1,000 mg Oral TID   atorvastatin  80 mg Oral Daily   busPIRone  15 mg Oral BID   carvedilol  6.25 mg Oral BID   Chlorhexidine Gluconate Cloth  6 each Topical Daily   digoxin  125 mcg Oral Daily   famotidine  20 mg Oral BID   fluticasone  1 spray Each Nare Daily   furosemide  20 mg Oral Daily   insulin aspart  0-5 Units Subcutaneous QHS   insulin aspart  0-9 Units Subcutaneous TID WC   lidocaine  1 patch Transdermal Q24H   methocarbamol  500 mg Oral QID   morphine  30 mg Oral Q12H   pantoprazole  40 mg Oral Daily   polyethylene glycol  17 g Oral BID   pregabalin  50 mg Oral TID   sacubitril-valsartan  1 tablet Oral BID   Infusions:   sodium chloride 75 mL/hr at 08/24/22 1319   heparin 1,150 Units/hr (08/24/22 0603)   vancomycin Stopped (08/24/22 1009)    Assessment:  61 yo M on apixaban PTA for afib.  Pharmacy has been consulted to dose heparin while apixaban on hold for possible neurosurgery.  Last apixaban dose 5/20 PM.  Heparin level (Anti-Xa level) remains elevated as it is influenced by recent  apixaban use.  Currently utilizing aPTTs to monitor heparin dosing.  aPTT 105, slightly above goal, on 1150 units/hr.  No bleeding noted.  Goal of Therapy:  Heparin level 0.3-0.7 units/ml aPTT 66-102 seconds Monitor platelets by anticoagulation protocol: Yes   Plan:  Reduce heparin to 1050 units/hr Next aPTT check at 2200. Will continue daily aPTT and heparin levels until correlation. Daily CBC to monitor for bleeding.  [employer], Pharm.D., BCPS Clinical Pharmacist  **Pharmacist phone directory can be found on [email or web address] listed under [facility].  08/24/2022 2:52 PM

## 2022-08-24 NOTE — Progress Notes (Signed)
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 Chestine Spore on 4/25 persistent A-fib on Eliquis, DM-2, seizure disorder, HTN, HLD and chronic pain return to ED with hypoglycemia to 38.  Patient was hospitalized for disseminated MRSA bacteremia from 4/9-5/17, and discharged on p.o. doxycycline.  Patient was hypoglycemic to 38 when EMS arrived.  His hypoglycemia persisted despite intervention in ED and leading to his rehospitalization.  He also have significant back pain.  MRI thoracic spine concerning for progressive discitis/osteomyelitis at T7-T9, extensive phlegmonous change in the paravertebral soft tissues,  partial collapse of T8 vertebral body with 40% vertebral body height loss and approximately 6 mm bony retropulsion, moderate canal stenosis and mild cord edema.  Neurosurgery and ID consulted.  Patient was started on IV vancomycin.  Neurosurgery planning surgical intervention next week.   Subjective: Seen and examined earlier this morning.  No major events overnight of this morning.  Endorses severe pain in his back radiating anteriorly to mid abdomen bilaterally.  Also reports constipation.  Last bowel movement about 2 days ago.  Denies bowel incontinence or urinary retention.  Denies numbness or tingling.  Objective: Vitals:   08/24/22 1009 08/24/22 1039 08/24/22 1212 08/24/22 1357  BP:      Pulse:      Resp: 15 17 15 16   Temp:      TempSrc:      SpO2:      Weight:      Height:        Examination:  GENERAL: No apparent distress.  Nontoxic. HEENT: MMM.  Vision and hearing grossly intact.  NECK: Supple.  No apparent JVD.   RESP:  No IWOB.  Fair aeration bilaterally. CVS:  RRR. Heart sounds normal.  ABD/GI/GU: BS+. Abd soft, NTND.  MSK/EXT:  Moves extremities. S/p right fifth ray amputation.  Sutures in place.  See picture under media. SKIN: Right foot surgical wound DCI.  See picture under media. NEURO: Awake, alert and oriented appropriately.  No apparent focal neuro deficit. PSYCH: Calm. Normal affect.   Procedures:  None  Microbiology summarized: MRSA PCR screen nonreactive.  Assessment and plan: Principal Problem:   Hypoglycemia Active Problems:   Essential hypertension   Insulin dependent type 2 diabetes mellitus (HCC)   Persistent atrial fibrillation (HCC)   Pure hypercholesterolemia   Cellulitis in diabetic foot (HCC)   Diskitis  Uncontrolled IDDM-2 with hypoglycemia and hyperglycemia: Hypoglycemia resolved.  A1c 9.1% on 4/10. 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  See details above.  Patient was hospitalized for this from 4/9-5/17 and discharged on doxycycline.  Previously treated with daptomycin followed by vancomycin and oritavancin. -Neurosurgery following-plan for surgical intervention next week -ID following-on IV vancomycin -Pain control-Dilaudid, MS Contin, oxycodone, Robaxin, Lyrica, Lidoderm patch -Hold Eliquis.  IV heparin pending surgery.  Right foot osteomyelitis s/p right fifth ray amputation on 4/30.  Surgical wound appears DCI.  Stitches in place. -Discussed with orthopedic surgery PA.  Recommended stitch  removal. -Antibiotics as above -Continue dressing   Acute on chronic pain -Pain control as above.   Essential hypertension -Continue carvedilol 6.25 mg p.o. twice daily   Persistent atrial fibrillation: Rate controlled.   CHA2DS2-VASc of 4. -Continue digoxin, Coreg and IV heparin.   Pure hypercholesterolemia Continue atorvastatin 80 mg p.o. daily.   Small/moderate pleural effusion: Previously treated for pneumonia with parapneumonic effusion.  -Antibiotics as above. -Repeat imaging in 2 to 3 days or sooner as needed   Physical deconditioning -PT/OT  Constipation -Bowel regimen  Body mass index is 24.91 kg/m.           DVT prophylaxis:  On full dose anticoagulation  Code Status: Full code Family Communication: None at bedside Level of care: Progressive Status is: Inpatient Remains inpatient appropriate because: Discitis/osteomyelitis, acute on chronic back pain   Final disposition: TBD Consultants:  Infectious disease Neurosurgery  55 minutes with more than 50% spent in reviewing records, counseling patient/family and coordinating care.   Sch Meds:  Scheduled Meds:  acetaminophen  1,000 mg Oral TID   atorvastatin  80 mg Oral Daily   busPIRone  15 mg Oral BID   carvedilol  6.25 mg Oral BID   Chlorhexidine Gluconate Cloth  6 each Topical Daily   digoxin  125 mcg Oral Daily   famotidine  20 mg Oral BID   fluticasone  1 spray Each Nare Daily   furosemide  20 mg Oral Daily   insulin aspart  0-5 Units Subcutaneous QHS   insulin aspart  0-9 Units Subcutaneous TID WC   lidocaine  1 patch Transdermal Q24H   methocarbamol  500 mg Oral QID   morphine  30 mg Oral Q12H   pantoprazole  40 mg Oral Daily   polyethylene glycol  17 g Oral BID   pregabalin  50 mg Oral TID   sacubitril-valsartan  1 tablet Oral BID   senna-docusate  1 tablet Oral BID   Continuous Infusions:  sodium chloride 75 mL/hr at 08/24/22 1319   heparin 1,150 Units/hr (08/24/22 0603)   vancomycin Stopped (08/24/22 1009)   PRN Meds:.HYDROmorphone (DILAUDID) injection, naLOXone (NARCAN)  injection, ondansetron **OR** ondansetron (ZOFRAN) IV, mouth rinse, oxyCODONE  Antimicrobials: Anti-infectives (From admission,  onward)    Start     Dose/Rate Route Frequency Ordered Stop   08/23/22 2200  vancomycin (VANCOREADY) IVPB 750 mg/150 mL        750 mg 150 mL/hr over 60 Minutes Intravenous Every 12 hours 08/23/22 0938     08/23/22 1030  vancomycin (VANCOREADY) IVPB 1500 mg/300 mL        1,500 mg 150 mL/hr over 120 Minutes Intravenous  Once 08/23/22 0938 08/23/22 1312   08/20/22 2200  doxycycline (VIBRA-TABS) tablet 100 mg  Status:  Discontinued        100 mg Oral 2 times daily 08/20/22 1737 08/23/22 0927        I have personally reviewed the following labs and images: CBC: Recent Labs  Lab 08/18/22 0343 08/20/22 1614 08/21/22 0256 08/22/22 0313 08/23/22 0326 08/24/22 0255  WBC 8.5 11.4* 9.1 9.3 9.8 10.2  NEUTROABS 5.9 9.5*  --   --   --   --   HGB 9.6* 10.8* 9.4* 10.9* 11.0* 10.0*  HCT 29.6* 32.5* 28.3* 33.9* 34.0* 29.9*  MCV 88.9 86.4 86.5 88.7 90.7 85.2  PLT 361 392 309 345 325 321   BMP &GFR Recent Labs  Lab 08/18/22 0343 08/20/22 1614 08/21/22 0256 08/22/22 0313 08/23/22 0326  NA  --  129* 128* 133* 129*  K  --  2.8* 4.0 4.1 3.9  CL  --  96* 98 102 99  CO2  --  23 22 22  21*  GLUCOSE  --  49* 264* 158* 164*  BUN  --  15 17 15 16   CREATININE  --  1.20 1.24 1.20 1.26*  CALCIUM  --  9.5 8.5* 9.0 8.8*  MG 1.8  --  1.5*  --  1.9  PHOS 3.1  --   --   --   --    Estimated Creatinine Clearance: 68.4 mL/min (A) (by C-G formula based on SCr of 1.26 mg/dL (H)). Liver & Pancreas: Recent Labs  Lab 08/20/22 1614 08/21/22 0256  AST 14* 11*  ALT 15 13  ALKPHOS 99 82  BILITOT 0.9 0.6  PROT 8.5* 7.0  ALBUMIN 2.8* 2.3*   No results for input(s): "LIPASE", "AMYLASE" in the last 168 hours. No results for input(s): "AMMONIA" in the last 168 hours. Diabetic: No results for input(s): "HGBA1C" in the last 72 hours. Recent Labs  Lab 08/23/22 1653 08/23/22 2127 08/23/22 2313 08/24/22 0818 08/24/22 1222  GLUCAP 178* 155* 132* 175* 140*   Cardiac Enzymes: No results for input(s):  "CKTOTAL", "CKMB", "CKMBINDEX", "TROPONINI" in the last 168 hours. No results for input(s): "PROBNP" in the last 8760 hours. Coagulation Profile: No results for input(s): "INR", "PROTIME" in the last 168 hours. Thyroid Function Tests: No results for input(s): "TSH", "T4TOTAL", "FREET4", "T3FREE", "THYROIDAB" in the last 72 hours. Lipid Profile: No results for input(s): "CHOL", "HDL", "LDLCALC", "TRIG", "CHOLHDL", "LDLDIRECT" in the last 72 hours. Anemia Panel: No results for input(s): "VITAMINB12", "FOLATE", "FERRITIN", "TIBC", "IRON", "RETICCTPCT" in the last 72 hours. Urine analysis:    Component Value Date/Time   COLORURINE STRAW (A) 08/20/2022 1545   APPEARANCEUR CLEAR 08/20/2022 1545   LABSPEC 1.005 08/20/2022 1545   PHURINE 6.0 08/20/2022 1545   GLUCOSEU NEGATIVE 08/20/2022 1545   HGBUR NEGATIVE 08/20/2022 1545   BILIRUBINUR NEGATIVE 08/20/2022 1545   BILIRUBINUR negative 09/25/2019 1555   KETONESUR NEGATIVE 08/20/2022 1545   PROTEINUR 30 (A) 08/20/2022 1545   UROBILINOGEN 1.0 09/25/2019 1555   NITRITE NEGATIVE 08/20/2022 1545   LEUKOCYTESUR NEGATIVE 08/20/2022 1545   Sepsis Labs: Invalid input(s): "PROCALCITONIN", "LACTICIDVEN"  Microbiology: Recent Results (from the past 240 hour(s))  MRSA Next Gen by PCR, Nasal     Status: Abnormal   Collection Time: 08/20/22  6:16 PM   Specimen: Nasal Mucosa; Nasal Swab  Result Value Ref Range Status   MRSA by PCR Next Gen DETECTED (A) NOT DETECTED Final    Comment: RESULT CALLED TO, READ BACK BY AND VERIFIED WITH: HARRIS,D AT 2208 ON 08/20/22 BY LUZOLOP (NOTE) The GeneXpert MRSA Assay (FDA approved for NASAL specimens only), is one component of a comprehensive MRSA colonization surveillance program. It is not intended to diagnose MRSA infection nor to guide or monitor treatment for MRSA infections. Test performance is not FDA approved in patients less than 87 years old. Performed at [facility], [address], [place], [place] [address]     Radiology Studies: No results found.    Shailene Demonbreun T. Leoda Smithhart [name]  If 7PM-7AM, please contact night-coverage [email or web address] 08/24/2022, 3:26 PM

## 2022-08-24 NOTE — Progress Notes (Signed)
Patient's right foot sutures removed, per order. Patient tolerated fairly well. OTA. No drainage noted.

## 2022-08-24 NOTE — Progress Notes (Signed)
[facility] night cross cover note:   I was notified by RN that the patient is complaining of lower abdominal discomfort as well as low back pain, without significant improvement following 0.5 mg of IV Dilaudid approximately 1 hour ago.  I subsequently ordered Dilaudid 1 mg IV x 1 dose now and modified his existing order for Dilaudid 0.5 mg IV every 3 hours prn to Dilaudid 1 mg IV every 2 hours as needed.   Newton Pigg, DO Hospitalist

## 2022-08-24 NOTE — Progress Notes (Signed)
[facility]  Date of Admission:  08/20/2022       Abx: 5/21-c vanc 5/9-c doxy   Dapto 4/13-14, 4/24-22, 5/9-15 Vanc 4/9-11, 4/23-5/8 Oritavancin 4/15    ASSESSMENT/recommendation: 62 year old male chf, afib on eliquis, dm2, recent mrsa bacteremia in setting OM of the right foot (5th ray) and seeding of tspine (dicitis, multilevel loculated prevertebral abscess, right pleural empyema) with om/discitis on doxy tail end treatment, admitted with opiate overdose and repeat imaging this admission showed progressive destructive findings of spine   #Recent history of MRSA bacteremia likely from 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  He stated that he did not time that well.  5/18 CTA chest on arrival showed proximal aggressive discitis/osteomyelitis.  Recommended MRI T spine for further characterization. -Patient has completed above 5 weeks antibiotics of vancomycin and then Dapto and transition to doxycycline alone on 5/15. - Repeat thoracic spine MRI on 5/20 showed progressive discitis/osteomyelitis at T7-T9, partial collapse of vertebral body 40% body with height loss at T8, 10 ventral epidural phlegmon T7-T9, extensive phlegmonous changes the paravertebral soft tissue additionally involve the mid thoracic levels.  Bilateral pleural effusions complex on the right. - Neurosurgery engaged tentatively have him on the schedule for tomorrow, Eliquis needs 3 to 5 days before having surgery.  Possible stabilization surgery in the form of pedicle screw fixation T5-T6 down to T11.  -- of note, crp (5.4) is trending lower from prior. While some evolution of discitis changes expected, difficult to tease out all the back infectious changes at this time but believe this is normal  imaging evolution. Abscess no longer found. And previous empyema left sided also appears resolved   Recommendations:  -continue vancomycin --as she had actino in the foot but that was amputated, we potentiall could stop doxycycline - Follow neurosurgery recommendations.  Please obtain cultures in the OR. -- if no surgery planned can switch back to doxy and planned a long course of a few more weeks -- he is rather constipated and will defer management to primary team -- discussed with primary team    I spent more than 50 minute reviewing data/chart, and coordinating care, providing direct face to face time providing counseling/discussing diagnostics/treatment plan with patient and treatment team      Principal Problem:   Hypoglycemia Active Problems:   Essential hypertension   Insulin dependent type 2 diabetes mellitus (HCC)   Persistent atrial fibrillation (HCC)   Pure hypercholesterolemia   Cellulitis in diabetic foot (HCC)   Diskitis   No Known Allergies  Scheduled Meds:  acetaminophen  1,000 mg Oral TID   atorvastatin  80 mg Oral Daily   busPIRone  15 mg Oral BID   carvedilol  6.25 mg Oral BID   Chlorhexidine Gluconate Cloth  6 each Topical Daily   digoxin  125 mcg Oral Daily   famotidine  20 mg Oral BID   fluticasone  1 spray Each Nare Daily   furosemide  20 mg Oral Daily   insulin aspart  0-5 Units Subcutaneous QHS   insulin aspart  0-9 Units Subcutaneous TID WC   lidocaine  1 patch Transdermal Q24H   methocarbamol  500 mg Oral QID   morphine  30 mg Oral Q12H   pantoprazole  40 mg Oral Daily  polyethylene glycol  17 g Oral BID   pregabalin  50 mg Oral TID   sacubitril-valsartan  1 tablet Oral BID   Continuous Infusions:  sodium chloride 75 mL/hr at 08/24/22 0513   heparin 1,150 Units/hr (08/24/22 0603)   vancomycin Stopped (08/24/22 1009)   PRN Meds:.HYDROmorphone (DILAUDID) injection, naLOXone (NARCAN)  injection, ondansetron **OR** ondansetron (ZOFRAN)  IV, mouth rinse, oxyCODONE   SUBJECTIVE: Patient didn't have fever, chill, just lots of pain lower back and abdomen. Using opiates more than he should  I reviewed his spine imaginge   Review of Systems: ROS All other ROS was negative, except mentioned above     OBJECTIVE: Vitals:   08/24/22 0330 08/24/22 0340 08/24/22 0350 08/24/22 0807  BP:  (!) 129/91  139/78  Pulse:  90  92  Resp: (!) 21 (!) 21 (!) 22 12  Temp:  98.6 F (37 C)  98.3 F (36.8 C)  TempSrc:  Oral  Oral  SpO2:  95%  95%  Weight:      Height:       Body mass index is 24.91 kg/m.  Physical Exam General/constitutional: no distress, pleasant HEENT: Normocephalic, PER, Conj Clear, EOMI, Oropharynx clear Neck supple CV: rrr no mrg Lungs: clear to auscultation, normal respiratory effort Abd: soft, distended, mildly tender Ext: no edema Skin: No Rash Neuro: nonfocal MSK: no peripheral joint swelling/tenderness/warmth; back spines nontender   Lab Results Lab Results  Component Value Date   WBC 10.2 08/24/2022   HGB 10.0 (L) 08/24/2022   HCT 29.9 (L) 08/24/2022   MCV 85.2 08/24/2022   PLT 321 08/24/2022    Lab Results  Component Value Date   CREATININE 1.26 (H) 08/23/2022   BUN 16 08/23/2022   NA 129 (L) 08/23/2022   K 3.9 08/23/2022   CL 99 08/23/2022   CO2 21 (L) 08/23/2022    Lab Results  Component Value Date   ALT 13 08/21/2022   AST 11 (L) 08/21/2022   ALKPHOS 82 08/21/2022   BILITOT 0.6 08/21/2022      Microbiology: Recent Results (from the past 240 hour(s))  MRSA Next Gen by PCR, Nasal     Status: Abnormal   Collection Time: 08/20/22  6:16 PM   Specimen: Nasal Mucosa; Nasal Swab  Result Value Ref Range Status   MRSA by PCR Next Gen DETECTED (A) NOT DETECTED Final    Comment: RESULT CALLED TO, READ BACK BY AND VERIFIED WITH: HARRIS,D AT 2208 ON 08/20/22 BY LUZOLOP (NOTE) The GeneXpert MRSA Assay (FDA approved for NASAL specimens only), is one component of a comprehensive  MRSA colonization surveillance program. It is not intended to diagnose MRSA infection nor to guide or monitor treatment for MRSA infections. Test performance is not FDA approved in patients less than 48 years old. Performed at [facility], [address], [place], [place] [address]      Serology:   Imaging: If present, new imagings (plain films, ct scans, and mri) have been personally visualized and interpreted; radiology reports have been reviewed. Decision making incorporated into the Impression / Recommendations.   5/20 mr thoracic spine 1. Findings of ongoing, progressive discitis-osteomyelitis at the T7-8 and T8-9 levels. Partial collapse of the T8 vertebral body with 40% vertebral body height loss and approximately 6 mm of bony retropulsion at the inferior endplate of T8. 2. Mild collapse of the superior endplate of T9 with approximately 25% vertebral body height loss. 3. Moderate canal stenosis at the T8-9 level secondary  to retropulsed T8 vertebral body. There may be mild cord edema at the T8-9 level, evaluation is somewhat degraded by motion artifact on the axial sequences. 4. Thin ventral epidural phlegmon at the T7 to T9 level. 5. Extensive phlegmonous changes in the paravertebral soft tissues adjacent to the involved midthoracic levels. No organized epidural abscess. 6. Bilateral pleural effusions, complex on the right, better characterized by same day CT chest.    5/20 Ct angio chest 1. No filling defect is identified in the pulmonary arterial tree to suggest pulmonary embolus. 2. Discitis-osteomyelitis at T7-T8-T9, with progressive bony destructive findings, including substantial collapse of T8 and 8 mm of posterior bony retropulsion narrowing the AP diameter of the thecal sac down to 0.4 cm, compatible with prominent central narrowing of the thecal sac. 3. Small left and small to moderate right pleural effusion with some loculation of  the right pleural effusion along the major fissure. Passive atelectasis noted in addition to scattered mild atelectasis in the lungs. 4. Mild cardiomegaly. Faint mitral valve calcification. 5. Coronary, aortic arch, and branch vessel atherosclerotic vascular disease. 6. Chronic endplate compressions at T3, no change from 07/12/2022. 7. Aortic and coronary atherosclerosis.  Raymondo Band, MD [facility] [phone] pager    08/24/2022, 10:25 AM

## 2022-08-24 NOTE — TOC Initial Note (Addendum)
Transition of Care [facility]) - Initial/Assessment Note    Patient Details  Name: David [name] MRN: [id number] Date of Birth: June 26, 1960  [facility] CM/SW Contact:    Kermit Balo, RN Phone Number: 08/24/2022, 11:26 AM  Clinical Narrative:                 Pt was recently admitted to the hospital with MRSA bacteremia with multiple possible sources. He was discharged to his motel with charity home health through [facility] for PT/OT. He was on PO abx.  Admitted with hypoglycemia.  Pt is active with [facility] for PCP.  No insurance. He uses [employer] for assistance with his medications. Pt has a SO that called ambulance.  Awaiting PT/OT evals and patient to have surgery next week.  TOC following for d/c needs.   Expected Discharge Plan: Home w Home Health Services Barriers to Discharge: Continued Medical Work up, Inadequate or no insurance   Patient Goals and CMS Choice            Expected Discharge Plan and Services   Discharge Planning Services: CM Consult Post Acute Care Choice: Home Health Living arrangements for the past 2 months: Hotel/Motel                                      Prior Living Arrangements/Services Living arrangements for the past 2 months: Hotel/Motel Lives with:: Significant Other Patient language and need for interpreter reviewed:: Yes            Current home services: Home PT, Home RT ([facility]) Criminal Activity/Legal Involvement Pertinent to Current Situation/Hospitalization: No - Comment as needed  Activities of Daily Living Home Assistive Devices/Equipment: Cane (specify quad or straight) ADL Screening (condition at time of admission) Patient's cognitive ability adequate to safely complete daily activities?: Yes Is the patient deaf or have difficulty hearing?: No Does the patient have difficulty seeing, even when wearing glasses/contacts?: No Does the patient have difficulty  concentrating, remembering, or making decisions?: No Patient able to express need for assistance with ADLs?: Yes Does the patient have difficulty dressing or bathing?: No Independently performs ADLs?: Yes (appropriate for developmental age) Does the patient have difficulty walking or climbing stairs?: Yes Weakness of Legs: Both Weakness of Arms/Hands: None  Permission Sought/Granted                  Emotional Assessment       Orientation: : Oriented to Self, Oriented to Place, Oriented to  Time, Oriented to Situation   Psych Involvement: No (comment)  Admission diagnosis:  Hypokalemia [E87.6] Hypoglycemia [E16.2] Patient Active Problem List   Diagnosis Date Noted   Hypoglycemia 08/20/2022   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   Adjustment disorder with mixed anxiety and depressed mood 08/13/2022   Diskitis 08/08/2022   MRSA bacteremia 08/08/2022   AKI (acute kidney injury) (HCC) 07/27/2022   Discitis thoracic region 07/27/2022   Bacteremia due to methicillin resistant Staphylococcus aureus 07/27/2022   Severe protein-calorie malnutrition (HCC) 07/26/2022   PVD (peripheral vascular disease) (HCC) 07/26/2022   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   Bacteremia 07/15/2022   Acute respiratory failure with hypoxia (HCC) 07/12/2022   Atelectasis 07/12/2022   Sepsis due to pneumonia (HCC) 07/12/2022   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)    Gangrene of toe of right foot (HCC)    Sepsis (HCC) 08/29/2021  SIRS (systemic inflammatory response syndrome) (HCC) 08/29/2021   Hyponatremia 08/29/2021   Hypomagnesemia 08/29/2021   Cellulitis in diabetic foot (HCC) 08/29/2021   Pure hypercholesterolemia 06/17/2021   Pain of left hand 09/06/2019   Cellulitis 08/26/2019   Persistent atrial fibrillation (HCC) 08/26/2019   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 [place] [address] Phone: [phone] Fax: [phone]  [facility] [address] [place] [address] Phone: [phone] Fax: [phone]     Social Determinants of Health (SDOH) Social History: SDOH Screenings   Food Insecurity: Patient Unable To Answer (08/23/2022)  Housing: Patient Unable To Answer (08/23/2022)  Transportation Needs: Patient Unable To Answer (08/23/2022)  Utilities: Patient Unable To Answer (08/23/2022)  Depression (PHQ2-9): Low Risk  (11/12/2021)  Stress: Stress Concern Present (09/26/2017)  Tobacco Use: Medium Risk (08/22/2022)   SDOH Interventions:     Readmission Risk Interventions    09/06/2021   12:07 PM  Readmission Risk Prevention Plan  Post Dischage Appt Complete  Medication Screening Complete  Transportation Screening Complete

## 2022-08-24 NOTE — Progress Notes (Signed)
ANTICOAGULATION CONSULT NOTE  Pharmacy Consult for Heparin Indication: atrial fibrillation  No Known Allergies  Patient Measurements: Height: 6' (182.9 cm) Weight: 83.3 kg (183 lb 10.3 oz) IBW/kg (Calculated) : 77.6 Heparin Dosing Weight:  77.8 kg  Vital Signs: Temp: 98.6 F (37 C) (05/21 2307) Temp Source: Oral (05/21 2307) BP: 164/94 (05/21 2200) Pulse Rate: 87 (05/21 2307)  Labs: Recent Labs    08/22/22 0313 08/23/22 0326 08/23/22 1757 08/24/22 0255  HGB 10.9* 11.0*  --  10.0*  HCT 33.9* 34.0*  --  29.9*  PLT 345 325  --  321  APTT  --   --  >200* 127*  HEPARINUNFRC  --   --   --  >1.10*  CREATININE 1.20 1.26*  --   --      Estimated Creatinine Clearance: 68.4 mL/min (A) (by C-G formula based on SCr of 1.26 mg/dL (H)).   Medical History: Past Medical History:  Diagnosis Date   Cardiomyopathy (HCC)    a. EF 45% in 2019.   CHF (congestive heart failure) (HCC)    Chronic anticoagulation 09/30/2017   Diabetes mellitus type 2 in nonobese [facility])    Does not have health insurance    Essential hypertension 09/26/2017   Financial difficulties    H/O noncompliance with medical treatment, presenting hazards to health    Non-insulin treated type 2 diabetes mellitus (HCC) 09/26/2017   Persistent atrial fibrillation (HCC)    Sleep apnea suspected 09/30/2017    Assessment: Active Problem(s): Just discharged from [facility] 5/17 now returns with Hypoglycemia 38, HTN, Afib  AC/Heme: hx afib on Eliquis 5 mg bid for afib (LD 5/20 PM)  >IV heparin 5/21 for neurosurgery plans.  - Hgb 11 stable, Plts WNL - CHA2DS2-VASc Score of at least 4.   Today, 5/21 - aPTT > 200 supratherapeutic on heparin infusion at 2000 units/hr  5/22 AM update:  aPTT elevated but trending down  Goal of Therapy:  aPTT 66-102 seconds Heparin level 0.3-0.7 units/mL Monitor platelets by anticoagulation protocol: Yes   Plan:  -Dec heparin to 1150 units/hr -1200 aPTT and heparin level  Abran Duke, PharmD, BCPS Clinical Pharmacist Phone: [phone]

## 2022-08-24 NOTE — Progress Notes (Signed)
PT Cancellation Note  Patient Details Name: David [name] MRN: [id number] DOB: [birth year]   Cancelled Treatment:    Reason Eval/Treat Not Completed: Other (comment) pt declining PT evaluation due to pain.  Lillia Pauls, PT, DPT Acute Rehabilitation Services Office [phone]    Norval Morton 08/24/2022, 3:12 PM

## 2022-08-24 NOTE — Consult Note (Addendum)
WOC Nurse Consult Note: Reason for Consult:Order placed by Bedside RN to take photograph of wound for MD (no MD name provided).  Bedside RN notified that WOC Nursing does not take photos and that Provider would need to photograph wound and upload to EMR..   WOC nursing team will not follow, but will remain available to this patient, the nursing and medical teams.  Please re-consult if needed.  Thank you for inviting [place] to participate in this patient's Plan of Care.  Ladona Mow, MSN, RN, CNS, GNP, Leda Min, [employer], [employer] phone:  [phone]

## 2022-08-25 DIAGNOSIS — E162 Hypoglycemia, unspecified: Secondary | ICD-10-CM | POA: Diagnosis not present

## 2022-08-25 LAB — RENAL FUNCTION PANEL
Albumin: 2.2 g/dL — ABNORMAL LOW (ref 3.5–5.0)
Anion gap: 11 (ref 5–15)
BUN: 21 mg/dL — ABNORMAL HIGH (ref 6–20)
CO2: 19 mmol/L — ABNORMAL LOW (ref 22–32)
Calcium: 9 mg/dL (ref 8.9–10.3)
Chloride: 100 mmol/L (ref 98–111)
Creatinine, Ser: 1.49 mg/dL — ABNORMAL HIGH (ref 0.61–1.24)
GFR, Estimated: 53 mL/min — ABNORMAL LOW (ref >60)
Glucose, Bld: 128 mg/dL — ABNORMAL HIGH (ref 70–99)
Phosphorus: 4.1 mg/dL (ref 2.5–4.6)
Potassium: 4 mmol/L (ref 3.5–5.1)
Sodium: 130 mmol/L — ABNORMAL LOW (ref 135–145)

## 2022-08-25 LAB — CBC
HCT: 30 % — ABNORMAL LOW (ref 39.0–52.0)
Hemoglobin: 9.8 g/dL — ABNORMAL LOW (ref 13.0–17.0)
MCH: 28.1 pg (ref 26.0–34.0)
MCHC: 32.7 g/dL (ref 30.0–36.0)
MCV: 86 fL (ref 80.0–100.0)
Platelets: 301 10*3/uL (ref 150–400)
RBC: 3.49 MIL/uL — ABNORMAL LOW (ref 4.22–5.81)
RDW: 14.8 % (ref 11.5–15.5)
WBC: 9.9 10*3/uL (ref 4.0–10.5)
nRBC: 0 % (ref 0.0–0.2)

## 2022-08-25 LAB — MAGNESIUM: Magnesium: 1.8 mg/dL (ref 1.7–2.4)

## 2022-08-25 LAB — APTT
aPTT: 106 seconds — ABNORMAL HIGH (ref 24–36)
aPTT: 67 seconds — ABNORMAL HIGH (ref 24–36)
aPTT: 69 seconds — ABNORMAL HIGH (ref 24–36)

## 2022-08-25 LAB — HEPARIN LEVEL (UNFRACTIONATED): Heparin Unfractionated: >1.1 IU/mL — ABNORMAL HIGH (ref 0.30–0.70)

## 2022-08-25 LAB — GLUCOSE, CAPILLARY
Glucose-Capillary: 133 mg/dL — ABNORMAL HIGH (ref 70–99)
Glucose-Capillary: 140 mg/dL — ABNORMAL HIGH (ref 70–99)
Glucose-Capillary: 126 mg/dL — ABNORMAL HIGH (ref 70–99)
Glucose-Capillary: 127 mg/dL — ABNORMAL HIGH (ref 70–99)
Glucose-Capillary: 142 mg/dL — ABNORMAL HIGH (ref 70–99)

## 2022-08-25 MED ORDER — OXYCODONE HCL 5 MG PO TABS
15.0000 mg | ORAL_TABLET | ORAL | Status: DC | PRN
  Administered 2022-08-25 – 2022-08-26 (×6): 15 mg via ORAL
  Filled 2022-08-25 (×6): qty 3

## 2022-08-25 MED ORDER — METHOCARBAMOL 750 MG PO TABS
750.0000 mg | ORAL_TABLET | Freq: Four times a day (QID) | ORAL | Status: DC
  Administered 2022-08-25 – 2022-09-05 (×44): 750 mg via ORAL
  Filled 2022-08-25 (×44): qty 1

## 2022-08-25 MED FILL — Fentanyl Citrate Preservative Free (PF) Inj 100 MCG/2ML: INTRAMUSCULAR | Qty: 2 | Status: AC

## 2022-08-25 NOTE — Progress Notes (Signed)
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 Chestine Spore on 4/25 persistent A-fib on Eliquis, DM-2, seizure disorder, HTN, HLD and chronic pain return to ED with hypoglycemia to 38.  Patient was hospitalized for disseminated MRSA bacteremia from 4/9-5/17, and discharged on p.o. doxycycline.  Patient was hypoglycemic to 38 when EMS arrived.  His hypoglycemia persisted despite intervention in ED and leading to his rehospitalization.  He also have significant back pain.  MRI thoracic spine concerning for progressive discitis/osteomyelitis at T7-T9, extensive phlegmonous change in the paravertebral soft tissues,  partial collapse of T8 vertebral body with 40% vertebral body height loss and approximately 6 mm bony retropulsion, moderate canal stenosis and mild cord edema.  Neurosurgery and ID consulted.  Patient was started on IV vancomycin.  Neurosurgery planning surgical intervention next week.  Therapy recommended CIR.   Subjective: Seen and examined earlier this morning.  No major events overnight of this morning.  Continues to endorse significant back pain.  Moaning and groaning.  No bowel or bladder habit change.  Objective: Vitals:   08/24/22 1009 08/24/22 1039 08/24/22 1212 08/24/22 1357  BP:      Pulse:      Resp: 15 17 15 16   Temp:      TempSrc:      SpO2:      Weight:      Height:        Examination: GENERAL: Appears to be in a lot of pain. HEENT: MMM.  Vision and hearing grossly intact.  NECK: Supple.  No apparent JVD.  RESP:  No IWOB.  Fair aeration bilaterally. CVS:  RRR. Heart sounds normal.  ABD/GI/GU: BS+. Abd  soft, NTND.  MSK/EXT:   No apparent deformity. S/p right fifth ray amputation.  Sutures removed. SKIN: no apparent skin lesion or wound NEURO: Awake and alert. Oriented appropriately.  No apparent focal neuro deficit. PSYCH: Normal affect.  Appears to be in a lot of pain.  Procedures:  None  Microbiology summarized: MRSA PCR screen nonreactive.  Assessment and plan: Principal Problem:   Hypoglycemia Active Problems:   Essential hypertension   Insulin dependent type 2 diabetes mellitus (HCC)   Persistent atrial fibrillation (HCC)   Pure hypercholesterolemia   Cellulitis in diabetic foot (HCC)   Diskitis  Uncontrolled IDDM-2 with hypoglycemia and hyperglycemia: Hypoglycemia resolved.  A1c 9.1% on 4/10. 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  See details above.  Patient was hospitalized for this from 4/9-5/17 and discharged on doxycycline.  Previously treated with daptomycin followed by vancomycin and oritavancin. -Neurosurgery following-plan for surgical intervention next week on 5/29 -ID following-on IV vancomycin -Pain control-appears to be in severe pain. -Continue IV Dilaudid 1 mg every 2 hours as needed severe pain -Continue MS Contin 30 mg every 12 hours and Lyrica 50 mg twice daily -Increased oxycodone to 15 mg every 4 hours as needed moderate pain -Increase Robaxin to 750 mg 4 times daily -Bowel regimen and  Narcan as needed. -Hold Eliquis.  IV heparin pending surgery.  Right foot osteomyelitis s/p right fifth ray amputation on 4/30.  Surgical wound appears DCI.  Stitches in place. -Stitches removed on 5/22 after discussion with orthopedic surgery PA -Antibiotics as above -Continue dressing   Acute on chronic pain -Pain control  as above.   Essential hypertension: Normotensive. -Continue carvedilol 6.25 mg p.o. twice daily   Persistent atrial fibrillation: Rate controlled.  CHA2DS2-VASc of 4. -Continue digoxin, Coreg and IV heparin.   Pure hypercholesterolemia -Continue atorvastatin 80 mg p.o. daily.   Small/moderate pleural effusion: Previously treated for pneumonia with parapneumonic effusion.  -Antibiotics as above. -Repeat imaging in 2 to 3 days or sooner as needed   Physical deconditioning -PT/OT  Constipation: Resolved. -Bowel regimen  Body mass index is 24.91 kg/m.           DVT prophylaxis:  On full dose anticoagulation  Code Status: Full code Family Communication: None at bedside Level of care: Progressive Status is: Inpatient Remains inpatient appropriate because: Discitis/osteomyelitis, acute on chronic back pain   Final disposition: CIR? Consultants:  Infectious disease Neurosurgery  55 minutes with more than 50% spent in reviewing records, counseling patient/family and coordinating care.   Sch Meds:  Scheduled Meds:  acetaminophen  1,000 mg Oral TID   atorvastatin  80 mg Oral Daily   busPIRone  15 mg Oral BID   carvedilol  6.25 mg Oral BID   Chlorhexidine Gluconate Cloth  6 each Topical Daily   digoxin  125 mcg Oral Daily   famotidine  20 mg Oral BID   fluticasone  1 spray Each Nare Daily   furosemide  20 mg Oral Daily   insulin aspart  0-5 Units Subcutaneous QHS   insulin aspart  0-9 Units Subcutaneous TID WC   lidocaine  1 patch Transdermal Q24H   methocarbamol  500 mg Oral QID   morphine  30 mg Oral Q12H   pantoprazole  40 mg Oral Daily   polyethylene glycol  17 g Oral BID   pregabalin  50 mg Oral TID   sacubitril-valsartan  1 tablet Oral BID   senna-docusate  1 tablet Oral BID   Continuous Infusions:  sodium chloride 75 mL/hr at 08/24/22 1319   heparin 1,150 Units/hr (08/24/22 0603)   vancomycin Stopped (08/24/22 1009)   PRN Meds:.HYDROmorphone  (DILAUDID) injection, naLOXone (NARCAN)  injection, ondansetron **OR** ondansetron (ZOFRAN) IV, mouth rinse, oxyCODONE  Antimicrobials: Anti-infectives (From admission, onward)    Start     Dose/Rate Route Frequency Ordered Stop   08/23/22 2200  vancomycin (VANCOREADY) IVPB 750 mg/150 mL        750 mg 150 mL/hr over 60 Minutes Intravenous Every 12 hours 08/23/22 0938     08/23/22 1030  vancomycin (VANCOREADY) IVPB 1500 mg/300 mL        1,500 mg 150 mL/hr over 120 Minutes Intravenous  Once 08/23/22 0938 08/23/22 1312   08/20/22 2200  doxycycline (VIBRA-TABS) tablet 100 mg  Status:  Discontinued        100 mg Oral 2 times daily 08/20/22 1737 08/23/22 0927        I have personally reviewed the following labs and images: CBC: Recent Labs  Lab 08/18/22 0343 08/20/22 1614 08/21/22 0256 08/22/22 0313 08/23/22 0326 08/24/22 0255  WBC 8.5 11.4* 9.1 9.3 9.8 10.2  NEUTROABS 5.9 9.5*  --   --   --   --   HGB 9.6* 10.8* 9.4* 10.9* 11.0* 10.0*  HCT  29.6* 32.5* 28.3* 33.9* 34.0* 29.9*  MCV 88.9 86.4 86.5 88.7 90.7 85.2  PLT 361 392 309 345 325 321   BMP &GFR Recent Labs  Lab 08/18/22 0343 08/20/22 1614 08/21/22 0256 08/22/22 0313 08/23/22 0326  NA  --  129* 128* 133* 129*  K  --  2.8* 4.0 4.1 3.9  CL  --  96* 98 102 99  CO2  --  23 22 22  21*  GLUCOSE  --  49* 264* 158* 164*  BUN  --  15 17 15 16   CREATININE  --  1.20 1.24 1.20 1.26*  CALCIUM  --  9.5 8.5* 9.0 8.8*  MG 1.8  --  1.5*  --  1.9  PHOS 3.1  --   --   --   --    Estimated Creatinine Clearance: 68.4 mL/min (A) (by C-G formula based on SCr of 1.26 mg/dL (H)). Liver & Pancreas: Recent Labs  Lab 08/20/22 1614 08/21/22 0256  AST 14* 11*  ALT 15 13  ALKPHOS 99 82  BILITOT 0.9 0.6  PROT 8.5* 7.0  ALBUMIN 2.8* 2.3*   No results for input(s): "LIPASE", "AMYLASE" in the last 168 hours. No results for input(s): "AMMONIA" in the last 168 hours. Diabetic: No results for input(s): "HGBA1C" in the last 72  hours. Recent Labs  Lab 08/23/22 1653 08/23/22 2127 08/23/22 2313 08/24/22 0818 08/24/22 1222  GLUCAP 178* 155* 132* 175* 140*   Cardiac Enzymes: No results for input(s): "CKTOTAL", "CKMB", "CKMBINDEX", "TROPONINI" in the last 168 hours. No results for input(s): "PROBNP" in the last 8760 hours. Coagulation Profile: No results for input(s): "INR", "PROTIME" in the last 168 hours. Thyroid Function Tests: No results for input(s): "TSH", "T4TOTAL", "FREET4", "T3FREE", "THYROIDAB" in the last 72 hours. Lipid Profile: No results for input(s): "CHOL", "HDL", "LDLCALC", "TRIG", "CHOLHDL", "LDLDIRECT" in the last 72 hours. Anemia Panel: No results for input(s): "VITAMINB12", "FOLATE", "FERRITIN", "TIBC", "IRON", "RETICCTPCT" in the last 72 hours. Urine analysis:    Component Value Date/Time   COLORURINE STRAW (A) 08/20/2022 1545   APPEARANCEUR CLEAR 08/20/2022 1545   LABSPEC 1.005 08/20/2022 1545   PHURINE 6.0 08/20/2022 1545   GLUCOSEU NEGATIVE 08/20/2022 1545   HGBUR NEGATIVE 08/20/2022 1545   BILIRUBINUR NEGATIVE 08/20/2022 1545   BILIRUBINUR negative 09/25/2019 1555   KETONESUR NEGATIVE 08/20/2022 1545   PROTEINUR 30 (A) 08/20/2022 1545   UROBILINOGEN 1.0 09/25/2019 1555   NITRITE NEGATIVE 08/20/2022 1545   LEUKOCYTESUR NEGATIVE 08/20/2022 1545   Sepsis Labs: Invalid input(s): "PROCALCITONIN", "LACTICIDVEN"  Microbiology: Recent Results (from the past 240 hour(s))  MRSA Next Gen by PCR, Nasal     Status: Abnormal   Collection Time: 08/20/22  6:16 PM   Specimen: Nasal Mucosa; Nasal Swab  Result Value Ref Range Status   MRSA by PCR Next Gen DETECTED (A) NOT DETECTED Final    Comment: RESULT CALLED TO, READ BACK BY AND VERIFIED WITH: HARRIS,D AT 2208 ON 08/20/22 BY LUZOLOP (NOTE) The GeneXpert MRSA Assay (FDA approved for NASAL specimens only), is one component of a comprehensive MRSA colonization surveillance program. It is not intended to diagnose MRSA infection nor to  guide or monitor treatment for MRSA infections. Test performance is not FDA approved in patients less than 15 years old. Performed at [facility], [address], [place], [place] [address]     Radiology Studies: No results found.    Laray Rivkin T. Blade Scheff [name]  If 7PM-7AM, please contact night-coverage [email or web address] 08/24/2022, 3:26  PM

## 2022-08-25 NOTE — Progress Notes (Signed)
Subjective: Patient reports severe back pain  Objective: Vital signs in last 24 hours: Temp:  [98.2 F (36.8 C)-98.6 F (37 C)] 98.4 F (36.9 C) (05/23 1510) Pulse Rate:  [73-88] 75 (05/23 1510) Resp:  [16-19] 18 (05/23 1510) BP: (128-152)/(74-91) 128/91 (05/23 1510) SpO2:  [96 %-100 %] 96 % (05/23 1510)  Intake/Output from previous day: 05/22 0701 - 05/23 0700 In: 3162.3 [P.O.:1230; I.V.:1632.3; IV Piggyback:300] Out: 2625 [Urine:2625] Intake/Output this shift: Total I/O In: -  Out: 1050 [Urine:1050]  MAEs well, full strength grossly in lower extremities  Lab Results: Recent Labs    08/24/22 0255 08/25/22 0026  WBC 10.2 9.9  HGB 10.0* 9.8*  HCT 29.9* 30.0*  PLT 321 301   BMET Recent Labs    08/23/22 0326 08/25/22 0026  NA 129* 130*  K 3.9 4.0  CL 99 100  CO2 21* 19*  GLUCOSE 164* 128*  BUN 16 21*  CREATININE 1.26* 1.49*  CALCIUM 8.8* 9.0    Studies/Results: No results found.  Assessment/Plan: [name] with T8-9 discitis/osteomyelitis with progressive osseous destruction, kyphosis and stenosis.  He has been followed by my partner, Dr. Yetta Barre who advised surgical treatment.  - I had a long discussion with the patient.  He has moderate-to-severe burden of medical comorbidities which significantly increase the risk of stabilization/debridment spinal procedure.  Nevertheless, with the progressive fracture, kyphosis and stenosis, it appears without surgical treatment, paraplegia and severe chronic pain would be unavoidable, and there would be increased risk of poor infection control as well.  As such, surgical stabilization, debridement, and decompression have been recommended-- T5-T11 posterior fusion, T8 transpedicular debridement and decompression.  Risks, benefits, alternatives, and expected convalescence were discussed with her.  Risks discussed included, but were not limited to bleeding, pain, infection, scar, spinal fluid leak, neurologic deficit, instability,  pseudoarthrosis, damage to nearby organs, and death.  Informed consent was obtained.  We will plan for surgery on Tuesday.    David [name] 08/25/2022, 4:19 PM

## 2022-08-25 NOTE — Progress Notes (Signed)
ANTICOAGULATION CONSULT NOTE - Follow Up Consult  Pharmacy Consult for Heparin (Apixaban on HOLD) Indication: atrial fibrillation  No Known Allergies  Patient Measurements: Height: 6' (182.9 cm) Weight: 83.3 kg (183 lb 10.3 oz) IBW/kg (Calculated) : 77.6 Heparin Dosing Weight: 77.8 kg  Vital Signs: Temp: 98.4 F (36.9 C) (05/23 0810) Temp Source: Oral (05/23 0810) BP: 152/84 (05/23 0810) Pulse Rate: 80 (05/23 0821)  Labs: Recent Labs    08/23/22 0326 08/23/22 1757 08/24/22 0255 08/24/22 1215 08/25/22 0026 08/25/22 1014  HGB 11.0*  --  10.0*  --  9.8*  --   HCT 34.0*  --  29.9*  --  30.0*  --   PLT 325  --  321  --  301  --   APTT  --    < > 127* 105* 106* 67*  HEPARINUNFRC  --   --  >1.10* >1.10* >1.10*  --   CREATININE 1.26*  --   --   --  1.49*  --    < > = values in this interval not displayed.     Estimated Creatinine Clearance: 57.9 mL/min (A) (by C-G formula based on SCr of 1.49 mg/dL (H)).   Medications:  Infusions:   sodium chloride 75 mL/hr at 08/25/22 0551   heparin 900 Units/hr (08/25/22 0344)   vancomycin 750 mg (08/25/22 0818)    Assessment: [name] on apixaban PTA for afib.  Pharmacy has been consulted to dose heparin while apixaban on hold for possible neurosurgery.  Last apixaban dose 5/20 PM.  Heparin level (Anti-Xa level) remains elevated as it is influenced by recent apixaban use.  Currently utilizing aPTTs to monitor heparin dosing.  aPTT 67, at low end of goal, on 900 units/hr.  No bleeding noted, Hgb stable, platelets are normal.  Goal of Therapy:  Heparin level 0.3-0.7 units/ml aPTT 66-102 seconds Monitor platelets by anticoagulation protocol: Yes   Plan:  Increase heparin drip to 950 units/hr 6h confirmatory aPTT Will continue daily aPTT and heparin levels until correlation. Daily CBC to monitor for bleeding.  Thank you for involving pharmacy in this patient's care.  Loura Back, PharmD, BCPS Clinical Pharmacist Clinical  phone for 08/25/2022 is [phone] 08/25/2022 11:54 AM

## 2022-08-25 NOTE — Progress Notes (Signed)
Subjective: Patient reports patient having moderate to severe back pain but no radicular pain  Objective: Vital signs in last 24 hours: Temp:  [98.2 F (36.8 C)-98.6 F (37 C)] 98.6 F (37 C) (05/23 0325) Pulse Rate:  [73-88] 88 (05/23 0325) Resp:  [14-19] 16 (05/23 0325) BP: (130-143)/(74-91) 134/91 (05/23 0325) SpO2:  [97 %-100 %] 99 % (05/23 0325)  Intake/Output from previous day: 05/22 0701 - 05/23 0700 In: 3162.3 [P.O.:1230; I.V.:1632.3; IV Piggyback:300] Out: 2625 [Urine:2625] Intake/Output this shift: No intake/output data recorded.  Neurologic: Grossly normal  Lab Results: Lab Results  Component Value Date   WBC 9.9 08/25/2022   HGB 9.8 (L) 08/25/2022   HCT 30.0 (L) 08/25/2022   MCV 86.0 08/25/2022   PLT 301 08/25/2022   Lab Results  Component Value Date   INR 1.7 (H) 07/26/2022   BMET Lab Results  Component Value Date   NA 130 (L) 08/25/2022   K 4.0 08/25/2022   CL 100 08/25/2022   CO2 19 (L) 08/25/2022   GLUCOSE 128 (H) 08/25/2022   BUN 21 (H) 08/25/2022   CREATININE 1.49 (H) 08/25/2022   CALCIUM 9.0 08/25/2022    Studies/Results: No results found.  Assessment/Plan: Patient here for hypoglycemia and discitis osteomyelitis T7-T9. Eliquis was stopped on Monday. Continue to hold blood thinners. Planning for surgical stabilization next week. Continue to work on pain control.    LOS: 2 days    Tiana Loft [facility] 08/25/2022, 8:07 AM

## 2022-08-25 NOTE — Progress Notes (Signed)
ANTICOAGULATION CONSULT NOTE - Follow Up Consult  Pharmacy Consult for heparin Indication: atrial fibrillation  Labs: Recent Labs    08/23/22 0326 08/23/22 1757 08/24/22 0255 08/24/22 1215 08/25/22 0026  HGB 11.0*  --  10.0*  --  9.8*  HCT 34.0*  --  29.9*  --  30.0*  PLT 325  --  321  --  301  APTT  --    < > 127* 105* 106*  HEPARINUNFRC  --   --  >1.10* >1.10* >1.10*  CREATININE 1.26*  --   --   --  1.49*   < > = values in this interval not displayed.    Assessment: 62yo male remains supratherapeutic on heparin with no change in PTT despite rate change; no infusion issues or signs of bleeding per RN.  Goal of Therapy:  aPTT 66-102 seconds   Plan:  Decrease heparin infusion by 2 units/kg/hr to 900 units/hr. Check PTT in 6 hours.   Vernard Gambles, PharmD, BCPS 08/25/2022 3:35 AM

## 2022-08-25 NOTE — Evaluation (Signed)
Physical Therapy Evaluation Patient Details Name: David [name] MRN: [id number] DOB: [birth year] Today's Date: 08/25/2022  History of Present Illness  Pt is a 62 y.o. male who presented 08/20/22  with hypoglycemia. MRI and CT show continued evolution of his discitis osteomyelitis with now loss of vertebral body height at T8 and some retropulsion of the inferior endplate given him moderate canal narrowing but no obvious cord compression and no obvious signal change in the cord. Plan for surgical intervention this admission. 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 Prior to his admission 07/12/22 he was mod I using a SPC but at d/c 08/28/22 he was using a RW. He lives with his significant other. A limited PT Evaluation was able to be completed today due to pt's severe back pain and pt declining attempts at EOB or OOB mobility at this time. Pt needed encouragement to participate with PT before his anticipated back surgery (which may be sometime next week) in order to reduce further deconditioning and muscular atrophy. Pt agreeable to rolling in bed for repositioning and supine lower extremity exercises today and reports he will try to get to at least EOB tomorrow. Pt displays deficits in gross overall strength, activity tolerance, and spinal precautions adherence. Will continue to follow acutely. Pending pt's progress, he may benefit from intensive inpatient rehab, >3 hours/day, but will continue to assess d/c needs as pt progresses with PT and his pain ideally improves.     Recommendations for follow up therapy are one component of a multi-disciplinary discharge planning process, led by the attending physician.  Recommendations may be updated  based on patient status, additional functional criteria and insurance authorization.  Follow Up Recommendations       Assistance Recommended at Discharge Frequent or constant Supervision/Assistance  Patient can return home with the following  Assist for transportation;Two people to help with walking and/or transfers;A lot of help with bathing/dressing/bathroom;Assistance with cooking/housework;Direct supervision/assist for financial management;Direct supervision/assist for medications management    Equipment Recommendations Other (comment) (TBA)  Recommendations for Other Services  Rehab consult;OT consult    Functional Status Assessment Patient has had a recent decline in their functional status and demonstrates the ability to make significant improvements in function in a reasonable and predictable amount of time.     Precautions / Restrictions Precautions Precautions: Fall;Back Precaution Booklet Issued: No Precaution Comments: reviewed precautions Required Braces or Orthoses: Spinal Brace Spinal Brace: Thoracolumbosacral orthotic;Applied in sitting position Other Brace: for use when ambulating Restrictions Weight Bearing Restrictions: Yes RLE Weight Bearing: Partial weight bearing RLE Partial Weight Bearing Percentage or Pounds: heel weight bearing Other Position/Activity Restrictions: with Darco shoe (not present in room, asked pt to get family to bring it)      Mobility  Bed Mobility Overal bed mobility: Needs Assistance Bed Mobility: Rolling Rolling: Mod assist         General bed mobility comments: Pt needing cues to flex leg and reach with ipsilateral UE to contralateral bed rail to maintain spinal precautions when rolling either direction in bed. Pt only rolling partially and declining a full roll onto his side, modA to do so. Pt declining EOB or OOB mobility this date due to pain, even after extensive education and encouragment along with  offer to return after he  gets more pain meds. Pt ultimately agreeable to learning exercises supine in bed to prevent deconditioning while in pain.    Transfers                   General transfer comment: Pt declining OOB mobility due to pain today. Darco shoe still not present either.    Ambulation/Gait               General Gait Details: Pt declining OOB mobility due to pain today. Darco shoe still not present either.  Stairs            Wheelchair Mobility    Modified Rankin (Stroke Patients Only)       Balance                                             Pertinent Vitals/Pain Pain Assessment Pain Assessment: Faces Faces Pain Scale: Hurts whole lot Pain Location: back Pain Descriptors / Indicators: Discomfort, Grimacing, Guarding, Moaning Pain Intervention(s): Limited activity within patient's tolerance, Monitored during session, Repositioned (pt reports x1 hour before due for more pain meds)    Home Living Family/patient expects to be discharged to:: Private residence Living Arrangements: Spouse/significant other Available Help at Discharge: Family Type of Home: Apartment Home Access: Elevator       Home Layout: One level Home Equipment: Rollator (4 wheels);Cane - single point;Shower seat;Grab bars - toilet;Grab bars - tub/shower Additional Comments: info carried over from recent admission    Prior Function Prior Level of Function : Independent/Modified Independent;Working/employed;Driving             Mobility Comments: utilized [facility] for mobility PTA 07/12/22, pt was using a RW at d/c 08/28/22 ADLs Comments: Previously Independent to Mod I with ADLs and IADLs and was driving.     Hand Dominance        Extremity/Trunk Assessment   Upper Extremity Assessment Upper Extremity Assessment: Defer to OT evaluation    Lower Extremity Assessment Lower Extremity Assessment: RLE deficits/detail;Generalized weakness RLE Deficits / Details: recent 5th  metatarsal amputation    Cervical / Trunk Assessment Cervical / Trunk Assessment: Other exceptions Cervical / Trunk Exceptions: TLSO for ambulation  Communication   Communication: No difficulties  Cognition Arousal/Alertness: Awake/alert Behavior During Therapy: Anxious (in regards to pain) Overall Cognitive Status: No family/caregiver present to determine baseline cognitive functioning                                 General Comments: Pt with tangential speech and needing encouragement to try to mobilize or perform exercises when pain is down to prevent deconditioning.        General Comments General comments (skin integrity, edema, etc.): educated pt on importance of mobility and exercising to prevent deconditioning and muscular atrophy; Provided pt with HEP handout consisting of SLR, quad sets, heel slides, and supine hip abduction/adduction with pt verbalizing understanding to complete these exercises as able when his pain is better throughout the day    Exercises General Exercises - Lower Extremity Quad Sets: AROM, Strengthening, Left, Other reps (comment), Supine (x3) Heel Slides: AROM, Both, Other reps (comment), Supine (x4 on L, x2 on R) Hip ABduction/ADduction: AROM, Both, Other reps (comment), Supine (x2)   Assessment/Plan    PT Assessment  Patient needs continued PT services  PT Problem List Decreased strength;Decreased activity tolerance;Decreased balance;Decreased mobility;Decreased cognition;Decreased knowledge of use of DME;Decreased safety awareness;Decreased knowledge of precautions;Pain       PT Treatment Interventions DME instruction;Gait training;Functional mobility training;Therapeutic activities;Therapeutic exercise;Balance training;Neuromuscular re-education;Cognitive remediation;Patient/family education;Wheelchair mobility training    PT Goals (Current goals can be found in the Care Plan section)  Acute Rehab PT Goals Patient Stated Goal: to  reduce pain PT Goal Formulation: With patient Time For Goal Achievement: 09/08/22 Potential to Achieve Goals: Good    Frequency Min 3X/week     Co-evaluation               AM-PAC PT "6 Clicks" Mobility  Outcome Measure Help needed turning from your back to your side while in a flat bed without using bedrails?: A Lot Help needed moving from lying on your back to sitting on the side of a flat bed without using bedrails?: Total Help needed moving to and from a bed to a chair (including a wheelchair)?: Total Help needed standing up from a chair using your arms (e.g., wheelchair or bedside chair)?: Total Help needed to walk in hospital room?: Total Help needed climbing 3-5 steps with a railing? : Total 6 Click Score: 7    End of Session   Activity Tolerance: Patient limited by pain Patient left: in bed;with call bell/phone within reach;with bed alarm set;Other (comment) (with MD)   PT Visit Diagnosis: Difficulty in walking, not elsewhere classified (R26.2);Other abnormalities of gait and mobility (R26.89);Muscle weakness (generalized) (M62.81);Pain Pain - Right/Left: Right Pain - part of body: Ankle and joints of foot (back)    Time: 4540-9811 PT Time Calculation (min) (ACUTE ONLY): 22 min   Charges:   PT Evaluation $PT Eval Moderate Complexity: 1 Mod          Raymond Gurney, PT, DPT Acute Rehabilitation Services  Office: [phone]   Jewel Baize 08/25/2022, 10:28 AM

## 2022-08-25 NOTE — Progress Notes (Signed)
ANTICOAGULATION CONSULT NOTE - Follow Up Consult  Pharmacy Consult for Heparin (Apixaban on HOLD) Indication: atrial fibrillation  No Known Allergies  Patient Measurements: Height: 6' (182.9 cm) Weight: 83.3 kg (183 lb 10.3 oz) IBW/kg (Calculated) : 77.6 Heparin Dosing Weight: 77.8 kg  Vital Signs: Temp: 98.5 F (36.9 C) (05/23 1956) Temp Source: Oral (05/23 1956) BP: 124/80 (05/23 1956) Pulse Rate: 72 (05/23 1956)  Labs: Recent Labs    08/23/22 0326 08/23/22 1757 08/24/22 0255 08/24/22 1215 08/25/22 0026 08/25/22 1014 08/25/22 1805  HGB 11.0*  --  10.0*  --  9.8*  --   --   HCT 34.0*  --  29.9*  --  30.0*  --   --   PLT 325  --  321  --  301  --   --   APTT  --    < > 127* 105* 106* 67* 69*  HEPARINUNFRC  --   --  >1.10* >1.10* >1.10*  --   --   CREATININE 1.26*  --   --   --  1.49*  --   --    < > = values in this interval not displayed.     Estimated Creatinine Clearance: 57.9 mL/min (A) (by C-G formula based on SCr of 1.49 mg/dL (H)).   Medications:  Infusions:   sodium chloride 75 mL/hr at 08/25/22 1335   heparin 950 Units/hr (08/25/22 1927)   vancomycin 750 mg (08/25/22 0818)    Assessment: [name] on apixaban PTA for afib.  Pharmacy has been consulted to dose heparin while apixaban on hold for possible neurosurgery.  Last apixaban dose 5/20 PM.  Heparin level (Anti-Xa level) remains elevated as it is influenced by recent apixaban use.  Currently utilizing aPTTs to monitor heparin dosing.  aPTT 69, no issues with heparin infusing and no signs of bleeding per RN  Goal of Therapy:  Heparin level 0.3-0.7 units/ml aPTT 66-102 seconds Monitor platelets by anticoagulation protocol: Yes   Plan:  Continue heparin drip at 950 units/hr Will continue daily aPTT and heparin levels until correlation. Daily CBC to monitor for bleeding.  Thank you for involving pharmacy in this patient's care.  Rexford Maus, PharmD, BCPS 08/25/2022 8:10 PM

## 2022-08-25 NOTE — Progress Notes (Signed)
Id brief note   NSG evaluated, planned for stabilization procedure    -no change in id plan -continue vanc for now and await more culture/operative finding  -potential early transition back to oral suppression abx

## 2022-08-25 NOTE — Progress Notes (Signed)
  Inpatient Rehab Admissions Coordinator :  Per therapy recommendations patient was screened for CIR candidacy by Ottie Glazier RN MSN. Patient is not yet at a level to tolerate the intensity required to pursue a CIR admit and noted surgical stabilization planned for next week. Bed level therapy evaluation today. The CIR admissions team will follow and monitor for progress and place a Rehab Consult order if felt to be appropriate. Please contact me with any questions.  Ottie Glazier RN MSN Admissions Coordinator [phone]

## 2022-08-26 ENCOUNTER — Ambulatory Visit: Payer: Self-pay | Admitting: Physician Assistant

## 2022-08-26 DIAGNOSIS — E7800 Pure hypercholesterolemia, unspecified: Secondary | ICD-10-CM

## 2022-08-26 DIAGNOSIS — E11628 Type 2 diabetes mellitus with other skin complications: Secondary | ICD-10-CM | POA: Diagnosis not present

## 2022-08-26 DIAGNOSIS — M546 Pain in thoracic spine: Secondary | ICD-10-CM

## 2022-08-26 DIAGNOSIS — E119 Type 2 diabetes mellitus without complications: Secondary | ICD-10-CM | POA: Diagnosis not present

## 2022-08-26 DIAGNOSIS — Z7189 Other specified counseling: Secondary | ICD-10-CM

## 2022-08-26 DIAGNOSIS — Z515 Encounter for palliative care: Secondary | ICD-10-CM

## 2022-08-26 DIAGNOSIS — E162 Hypoglycemia, unspecified: Secondary | ICD-10-CM | POA: Diagnosis not present

## 2022-08-26 DIAGNOSIS — Z794 Long term (current) use of insulin: Secondary | ICD-10-CM

## 2022-08-26 DIAGNOSIS — M4644 Discitis, unspecified, thoracic region: Secondary | ICD-10-CM

## 2022-08-26 DIAGNOSIS — I4819 Other persistent atrial fibrillation: Secondary | ICD-10-CM

## 2022-08-26 DIAGNOSIS — L03119 Cellulitis of unspecified part of limb: Secondary | ICD-10-CM

## 2022-08-26 DIAGNOSIS — I10 Essential (primary) hypertension: Secondary | ICD-10-CM | POA: Diagnosis not present

## 2022-08-26 LAB — APTT: aPTT: 73 seconds — ABNORMAL HIGH (ref 24–36)

## 2022-08-26 LAB — RENAL FUNCTION PANEL
Albumin: 2.3 g/dL — ABNORMAL LOW (ref 3.5–5.0)
Anion gap: 10 (ref 5–15)
BUN: 18 mg/dL (ref 6–20)
CO2: 19 mmol/L — ABNORMAL LOW (ref 22–32)
Calcium: 9.1 mg/dL (ref 8.9–10.3)
Chloride: 101 mmol/L (ref 98–111)
Creatinine, Ser: 1.31 mg/dL — ABNORMAL HIGH (ref 0.61–1.24)
GFR, Estimated: >60 mL/min (ref >60)
Glucose, Bld: 158 mg/dL — ABNORMAL HIGH (ref 70–99)
Phosphorus: 3.3 mg/dL (ref 2.5–4.6)
Potassium: 3.9 mmol/L (ref 3.5–5.1)
Sodium: 130 mmol/L — ABNORMAL LOW (ref 135–145)

## 2022-08-26 LAB — C-REACTIVE PROTEIN: CRP: 11.4 mg/dL — ABNORMAL HIGH (ref <1.0)

## 2022-08-26 LAB — CBC
HCT: 31.5 % — ABNORMAL LOW (ref 39.0–52.0)
Hemoglobin: 10.5 g/dL — ABNORMAL LOW (ref 13.0–17.0)
MCH: 29.2 pg (ref 26.0–34.0)
MCHC: 33.3 g/dL (ref 30.0–36.0)
MCV: 87.7 fL (ref 80.0–100.0)
Platelets: 305 10*3/uL (ref 150–400)
RBC: 3.59 MIL/uL — ABNORMAL LOW (ref 4.22–5.81)
RDW: 14.8 % (ref 11.5–15.5)
WBC: 10.2 10*3/uL (ref 4.0–10.5)
nRBC: 0 % (ref 0.0–0.2)

## 2022-08-26 LAB — VANCOMYCIN, TROUGH: Vancomycin Tr: 20 ug/mL (ref 15–20)

## 2022-08-26 LAB — SEDIMENTATION RATE: Sed Rate: 86 mm/hr — ABNORMAL HIGH (ref 0–16)

## 2022-08-26 LAB — GLUCOSE, CAPILLARY
Glucose-Capillary: 169 mg/dL — ABNORMAL HIGH (ref 70–99)
Glucose-Capillary: 139 mg/dL — ABNORMAL HIGH (ref 70–99)
Glucose-Capillary: 123 mg/dL — ABNORMAL HIGH (ref 70–99)
Glucose-Capillary: 98 mg/dL (ref 70–99)

## 2022-08-26 LAB — HEPARIN LEVEL (UNFRACTIONATED): Heparin Unfractionated: 0.83 IU/mL — ABNORMAL HIGH (ref 0.30–0.70)

## 2022-08-26 LAB — MAGNESIUM: Magnesium: 1.7 mg/dL (ref 1.7–2.4)

## 2022-08-26 LAB — VANCOMYCIN, PEAK: Vancomycin Pk: 36 ug/mL (ref 30–40)

## 2022-08-26 MED ORDER — VANCOMYCIN HCL 1250 MG/250ML IV SOLN
1250.0000 mg | INTRAVENOUS | Status: DC
  Administered 2022-08-26: 1250 mg via INTRAVENOUS
  Filled 2022-08-26: qty 250

## 2022-08-26 MED ORDER — DIPHENHYDRAMINE HCL 50 MG/ML IJ SOLN: 12.5000 mg | Freq: Four times a day (QID) | INTRAMUSCULAR | Status: DC | PRN

## 2022-08-26 MED ORDER — HYDROMORPHONE 1 MG/ML IV SOLN
INTRAVENOUS | Status: DC
  Administered 2022-08-26: 30 mg via INTRAVENOUS
  Filled 2022-08-26: qty 30

## 2022-08-26 MED ORDER — SODIUM CHLORIDE 0.9% FLUSH: 9.0000 mL | INTRAVENOUS | Status: DC | PRN

## 2022-08-26 MED ORDER — ENSURE ENLIVE PO LIQD
237.0000 mL | Freq: Two times a day (BID) | ORAL | Status: DC
  Administered 2022-08-28 – 2022-10-28 (×106): 237 mL via ORAL

## 2022-08-26 MED ORDER — NALOXONE HCL 0.4 MG/ML IJ SOLN: 0.4000 mg | INTRAMUSCULAR | Status: DC | PRN

## 2022-08-26 MED ORDER — DIPHENHYDRAMINE HCL 12.5 MG/5ML PO ELIX: 12.5000 mg | ORAL_SOLUTION | Freq: Four times a day (QID) | ORAL | Status: DC | PRN

## 2022-08-26 NOTE — Consult Note (Signed)
Palliative Care Consult Note                                  Date: 08/26/2022   Patient Name: David [name]  DOB: 11-Nov-1960  MRN: [id number]  Age / Sex: 62 y.o., male  PCP: David Register, MD Referring Physician: [name], MD  Reason for Consultation: Pain control, goals of care  HPI/Patient Profile: 62 y.o. male  with history of chronic CHF, persistent atrial fibrillation on anticoagulation, and T2DM. 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 He was discharged home 08/19/22 with po doxycycline and po pain medication (Oxycodone IR and Oxycontin). He was readmitted on 08/20/2022 with hypoglycemia.  Patient was hypoglycemic to 38 when EMS arrived.  He was also reporting significant back pain.  MRI thoracic spine concerning for progressive discitis/osteomyelitis at T7-T9. ID consulted and started IV vancomycin.  Neurosurgery planning on surgical intervention next week.  Palliative Medicine has been consulted for pain control and goals of care.   Subjective:   I have reviewed medical records including progress notes, labs and imaging, met with patient at bedside to discuss diagnosis, prognosis, GOC, and symptom management.  Patient is known to PMT from his recent hospitalization.  I re-introduced Palliative Medicine as specialized medical care for people living with serious illness. It focuses on providing relief from the symptoms and stress of a serious illness.   We discussed patient's current illness and what it means in the larger context of his ongoing co-morbidities. Current clinical status was reviewed.   Created space and opportunity for patient to express thoughts and feelings regarding his current medical situation. Values and goals of care were  attempted to be elicited.  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  He has been separated from his wife/David [name], but states they are "trying to work it out".  Functional Status: David [name] reports that he was a very active person prior to this illness. He has had a significant decline in functional status due to this illness.  The associated loss of independence has been very difficult for him.  Symptom Management: A significant portion of our discussion was focused on pain control. [name] reports pain in the mid-back that radiates bilaterally around his ribs. He describes is as burning and throbbing in nature. [name] complains that nursing staff is not always giving the as needed pain medication in a timely manner. He is agreeable to try PCA for better pain control.  He understands this is a temporary pain control method and that his knees will be reassessed after surgical intervention next week.  Additional Discussion: David [name] shares with me a detailed narrative of his complex medical course since early April. He shares that he presented to [name] ED on 07/12/2022 with excruciating back pain, and never expected it was due to osteomyelitis/discitis of his thoracic spine.  David [name] throughout our discussion. He expresses concern and frustration regarding the loss of his career and the subsequent financial issues.  We did discuss code status, in the setting that David [name] is currently documented as full code but was DNR during previous hospitalization. Encouraged reconsideration of DNR/DNI status.  I explained that DNR/DNI does not change the medical plan and it only comes into effect after  a person has arrested (died) and is a protective measure to keep [place] from harming someone in their last moments of life.  Fazal agrees that DNR status is appropriate, but wants to speak with his wife David [name] prior to making that decision.  Discussed the importance of continued  conversation with patient and the medical team regarding overall plan of care and treatment options.   Review of Systems  Musculoskeletal:  Positive for back pain.    Objective:   Primary Diagnoses: Present on Admission:  Hypoglycemia  Essential hypertension  Persistent atrial fibrillation (HCC)  Pure hypercholesterolemia  Diskitis  Cellulitis in diabetic [name])   Physical Exam Vitals reviewed.  Constitutional:      General: He is not in acute distress.    Comments: Chronically ill-appearing  Pulmonary:     Effort: Pulmonary effort is normal.  Neurological:     Mental Status: He is alert and oriented to person, place, and time.     Motor: Weakness present.  Psychiatric:        Mood and Affect: Affect is tearful.     Vital Signs:  BP (!) 139/90 (BP Location: Right Arm)   Pulse 72   Temp 98.6 F (37 C) (Oral)   Resp 18   Ht 6' (1.829 m)   Wt 83.3 kg   SpO2 100%   BMI 24.91 kg/m   Palliative Assessment/Data: PPS 30-40%     Assessment & Plan:   SUMMARY OF RECOMMENDATIONS   Full scope care PCA Dilaudid - full dose D/C prn oxycodone IR  Continue MS Contin 30 mg every 12 hours TOC consult to support financial issues/concerns PMT will continue to follow  Primary Decision Maker: PATIENT  Code Status/Advance Care Planning: Full code for now - David [name] seems to agree with DNR but wants to speak with David [name] prior to making that decision  Symptom Management:  Continue Lyrica 50 mg 3 times daily Continue Robaxin 750 mg 4 times daily Lidocaine 5% patch BuSpar 15 mg 2 times daily Narcan as needed Continue bowel regimen: Senna-Docusate 2 times daily, MiraLAX 2 times daily  Prognosis:  Unable to determine  Discharge Planning:  To Be Determined   Discussed with: Dr. Alanda [name]   Thank you for allowing [place] to participate in the care of David [name]   Time Total: 90 minutes  Greater than 50%  of this time was spent counseling and coordinating care related  to the above assessment and plan.  Signed by: David Foot, NP Palliative Medicine Team  Team Phone # [phone]  For individual providers, please see AMION

## 2022-08-26 NOTE — Progress Notes (Signed)
3W Charge RN Note:  Notified by primary nurse at bedside today Nneoma RN of family concern of missing belongings. Wife/ [name] reports that patient was at [employer] and transferred to [facility]. Missing belongings are a wallet, clothes, orthoboots/special shoes?, pajama pants,  glasses, cane.  Security officer called at [employer] to track belongings as he was admitted from ED to room 1236 in ICU/Stepdown on 08/20/2022. Patient then transferred to [facility] on 08/23/2022 to 3W 34. Security officer called back to state that there were no belongings located.  I passed along this information to patient at bedside and wife/[name] who I called on speakerphone.

## 2022-08-26 NOTE — Progress Notes (Signed)
Physical Therapy Treatment Patient Details Name: David [name] MRN: [id number] DOB: 1961-01-02 Today's Date: 08/26/2022   History of Present Illness Pt is a 62 y.o. male who presented 08/20/22  with hypoglycemia. MRI and CT show continued evolution of his discitis osteomyelitis with now loss of vertebral body height at T8 and some retropulsion of the inferior endplate given him moderate canal narrowing but no obvious cord compression and no obvious signal change in the cord. Plan for surgical intervention this admission. 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 However, he was willing to try to progress mobility in the bed but declined EOB or OOB mobility this date. He was able to roll and sustain sidelying for extended periods of time with min guard assist in order to perform pericare and bedding change. He also was able to progress to placing the bed in a chair position and pulling himself forward to lift his trunk off the bed via use of the bedrails and minA. However, he could only sit up ~15 sec before returning to supine due to the pain. Continued to encourage increased frequency of mobility and exercises over weekend. Will continue to follow acutely.     Recommendations for follow up therapy are one component of a multi-disciplinary discharge planning process, led by the attending physician.  Recommendations may be updated based on patient status, additional functional criteria and insurance authorization.  Follow Up Recommendations       Assistance Recommended at Discharge Frequent or constant Supervision/Assistance  Patient can return home with the following Assist for transportation;Two  people to help with walking and/or transfers;A lot of help with bathing/dressing/bathroom;Assistance with cooking/housework;Direct supervision/assist for financial management;Direct supervision/assist for medications management   Equipment Recommendations  Other (comment) (TBA)    Recommendations for Other Services       Precautions / Restrictions Precautions Precautions: Fall;Back Precaution Booklet Issued: No Precaution Comments: reviewed precautions Required Braces or Orthoses: Spinal Brace Spinal Brace: Thoracolumbosacral orthotic;Applied in sitting position Other Brace: for use when ambulating Restrictions Weight Bearing Restrictions: Yes RLE Weight Bearing: Partial weight bearing RLE Partial Weight Bearing Percentage or Pounds: heel weight bearing Other Position/Activity Restrictions: with Darco shoe (not present in room, asked pt to get family to bring it)     Mobility  Bed Mobility Overal bed mobility: Needs Assistance Bed Mobility: Rolling, Supine to Sit Rolling: Min guard   Supine to sit: Min assist, HOB elevated (from bed in chair position and pt pulling on bed rails)     General bed mobility comments: Pt following cues to flex knees and push off bed surface while reaching to contralateral bed rail to roll either direction for pericare and bedding change, min guard for safety. Pt remained in sidelying for extended periods of time for pericare. Pt declining EOB but agreeable to attempting sitting up from bed in chair position. x1 rep with pt pulling on bil bed rails to lift trunk off elevated HOB with bed in chair position, minA and slow to complete due to pain. Declined further attempts due to pain.    Transfers                   General  transfer comment: Pt declining OOB mobility due to pain today. Darco shoe still not present either.    Ambulation/Gait               General Gait Details: Pt declining OOB mobility due to pain today. Darco shoe still  not present either.   Stairs             Wheelchair Mobility    Modified Rankin (Stroke Patients Only)       Balance Overall balance assessment: Needs assistance Sitting-balance support: Bilateral upper extremity supported, Feet unsupported Sitting balance-Leahy Scale: Poor Sitting balance - Comments: Bil UE support on bedrails with trunk lifted off bed surface with bed in chair position, minA, sustaining for ~15 sec before returning to supine due to pain.                                    Cognition Arousal/Alertness: Awake/alert Behavior During Therapy: Anxious (in regards to pain) Overall Cognitive Status: No family/caregiver present to determine baseline cognitive functioning Area of Impairment: Memory, Safety/judgement, Problem solving, Awareness, Attention                   Current Attention Level: Sustained Memory: Decreased recall of precautions, Decreased short-term memory   Safety/Judgement: Decreased awareness of safety, Decreased awareness of deficits Awareness: Intellectual Problem Solving: Slow processing, Decreased initiation, Requires verbal cues General Comments: Pt with tangential speech and needing encouragement to try to mobilize or perform exercises when pain meds are in effect. Pt distracted and limited by pain.        Exercises      General Comments        Pertinent Vitals/Pain Pain Assessment Pain Assessment: Faces Faces Pain Scale: Hurts whole lot Pain Location: back Pain Descriptors / Indicators: Discomfort, Grimacing, Guarding, Moaning Pain Intervention(s): Monitored during session, Limited activity within patient's tolerance, Repositioned, Patient requesting pain meds-RN notified, Premedicated before session    Home Living                          Prior Function            PT Goals (current goals can now be found in the care plan section) Acute Rehab PT Goals Patient Stated Goal: to reduce  pain PT Goal Formulation: With patient Time For Goal Achievement: 09/08/22 Potential to Achieve Goals: Good Progress towards PT goals: Progressing toward goals    Frequency    Min 3X/week      PT Plan Current plan remains appropriate    Co-evaluation              AM-PAC PT "6 Clicks" Mobility   Outcome Measure  Help needed turning from your back to your side while in a flat bed without using bedrails?: A Little Help needed moving from lying on your back to sitting on the side of a flat bed without using bedrails?: A Lot Help needed moving to and from a bed to a chair (including a wheelchair)?: Total Help needed standing up from a chair using your arms (e.g., wheelchair or bedside chair)?: Total Help needed to walk in hospital room?: Total Help needed climbing 3-5 steps with a railing? : Total 6 Click Score: 9    End of Session   Activity Tolerance: Patient limited by pain Patient left: in bed;with call bell/phone within reach;with bed alarm set Nurse  Communication: Patient requests pain meds PT Visit Diagnosis: Difficulty in walking, not elsewhere classified (R26.2);Other abnormalities of gait and mobility (R26.89);Muscle weakness (generalized) (M62.81);Pain Pain - Right/Left: Right Pain - part of body: Ankle and joints of foot (back)     Time: 1610-9604 PT Time Calculation (min) (ACUTE ONLY): 46 min  Charges:  $Therapeutic Activity: 38-52 mins                     Raymond Gurney, PT, DPT Acute Rehabilitation Services  Office: [phone]    Jewel Baize 08/26/2022, 4:34 PM

## 2022-08-26 NOTE — TOC Progression Note (Signed)
Transition of Care [facility]) - Progression Note    Patient Details  Name: David [name] MRN: [id number] Date of Birth: Jun 18, 1960  [facility] CM/SW Contact  Lockie Pares, RN Phone Number: 08/26/2022, 11:54 AM  Clinical Narrative:    Patient is progressing  with PT and OT was reviewed by CIR, not yet ready to do intensive therapy, will be having neurosurgical intervention on Tuesday 08/30/22.   TOC will continue to follow   Expected Discharge Plan: Home w Home Health Services Barriers to Discharge: Continued Medical Work up, Inadequate or no insurance  Expected Discharge Plan and Services   Discharge Planning Services: CM Consult Post Acute Care Choice: Home Health Living arrangements for the past 2 months: Hotel/Motel                                       Social Determinants of Health (SDOH) Interventions SDOH Screenings   Food Insecurity: Patient Unable To Answer (08/23/2022)  Housing: Patient Unable To Answer (08/23/2022)  Transportation Needs: Patient Unable To Answer (08/23/2022)  Utilities: Patient Unable To Answer (08/23/2022)  Depression (PHQ2-9): Low Risk  (11/12/2021)  Stress: Stress Concern Present (09/26/2017)  Tobacco Use: Medium Risk (08/22/2022)    Readmission Risk Interventions    09/06/2021   12:07 PM  Readmission Risk Prevention Plan  Post Dischage Appt Complete  Medication Screening Complete  Transportation Screening Complete

## 2022-08-26 NOTE — Progress Notes (Signed)
ANTICOAGULATION CONSULT NOTE - Follow Up Consult  Pharmacy Consult for Heparin (Apixaban on HOLD) Indication: atrial fibrillation  No Known Allergies  Patient Measurements: Height: 6' (182.9 cm) Weight: 83.3 kg (183 lb 10.3 oz) IBW/kg (Calculated) : 77.6 Heparin Dosing Weight: 77.8 kg  Vital Signs: Temp: 98.8 F (37.1 C) (05/24 1106) Temp Source: Oral (05/24 1106) BP: 133/79 (05/24 1106) Pulse Rate: 73 (05/24 1106)  Labs: Recent Labs    08/24/22 0255 08/24/22 1215 08/25/22 0026 08/25/22 1014 08/25/22 1805 08/25/22 2355  HGB 10.0*  --  9.8*  --   --  10.5*  HCT 29.9*  --  30.0*  --   --  31.5*  PLT 321  --  301  --   --  305  APTT 127* 105* 106* 67* 69* 73*  HEPARINUNFRC >1.10* >1.10* >1.10*  --   --  0.83*  CREATININE  --   --  1.49*  --   --  1.31*     Estimated Creatinine Clearance: 65.8 mL/min (A) (by C-G formula based on SCr of 1.31 mg/dL (H)).   Medications:  Infusions:   sodium chloride 75 mL/hr at 08/26/22 0626   heparin 950 Units/hr (08/26/22 0000)   vancomycin      Assessment: [name] on apixaban PTA for afib.  Pharmacy has been consulted to dose heparin while apixaban on hold for possible neurosurgery.  Last apixaban dose 5/20 PM.  Heparin level (Anti-Xa level) remains elevated as it is influenced by recent apixaban use.  Currently utilizing aPTTs to monitor heparin dosing.  aPTT 73, at goal, on 950 units/hr.  Heparin level down to 0.83 but still affected by apixaban. No bleeding noted, Hgb stable, platelets are normal.  Goal of Therapy:  Heparin level 0.3-0.7 units/ml aPTT 66-102 seconds Monitor platelets by anticoagulation protocol: Yes   Plan:  Continue heparin drip at 950 units/hr Will continue daily aPTT and heparin levels until correlation. Daily CBC to monitor for bleeding.  Thank you for involving pharmacy in this patient's care.  Loura Back, PharmD, BCPS Clinical Pharmacist Clinical phone for 08/26/2022 is [phone] 08/26/2022  12:14 PM

## 2022-08-26 NOTE — Progress Notes (Signed)
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 Chestine Spore on 4/25 persistent A-fib on Eliquis, DM-2, seizure disorder, HTN, HLD and chronic pain return to ED with hypoglycemia to 38.  Patient was hospitalized for disseminated MRSA bacteremia from 4/9-5/17, and discharged on p.o. doxycycline.  Patient was hypoglycemic to 38 when EMS arrived.  His hypoglycemia persisted despite intervention in ED and leading to his rehospitalization.  He also have significant back pain.  MRI thoracic spine concerning for progressive discitis/osteomyelitis at T7-T9, extensive phlegmonous change in the paravertebral soft tissues,  partial collapse of T8 vertebral body with 40% vertebral body height loss and approximately 6 mm bony retropulsion, moderate canal stenosis and mild cord edema.  Neurosurgery and ID consulted.  Patient was started on IV vancomycin.  Neurosurgery planning surgical intervention next week.  Therapy recommended CIR.   Subjective: Seen and examined earlier this morning.  No major events overnight of this morning.  No major complaints.  Pain fairly controlled with current regimen.  Objective: Vitals:   08/26/22 0351 08/26/22 0818 08/26/22 0949 08/26/22 1106  BP: (!) 151/86 (!) 140/81  133/79  Pulse: 84 77 72 73  Resp: 19 18  18   Temp: 98.1 F (36.7 C) 99.4 F (37.4 C)  98.8 F (37.1 C)  TempSrc: Oral Oral  Oral  SpO2: 95%     Weight:      Height:        Examination:  GENERAL: No apparent distress.  Nontoxic. HEENT: MMM.  Vision and hearing grossly intact.  NECK: Supple.  No apparent JVD.  RESP:  No IWOB.  Fair aeration  bilaterally. CVS:  RRR. Heart sounds normal.  ABD/GI/GU: BS+. Abd soft, NTND.  MSK/EXT:   S/p right fifth ray amputation.  Moves both legs. SKIN: no apparent skin lesion or wound NEURO: Awake and alert. Oriented appropriately.  No apparent focal neuro deficit. PSYCH: Calm. Normal affect.   Procedures:  None  Microbiology summarized: MRSA PCR screen nonreactive.  Assessment and plan: Principal Problem:   Hypoglycemia Active Problems:   Essential hypertension   Insulin dependent type 2 diabetes mellitus (HCC)   Persistent atrial fibrillation (HCC)   Pure hypercholesterolemia   Cellulitis in diabetic foot (HCC)   Diskitis  Uncontrolled IDDM-2 with hypoglycemia and hyperglycemia: Hypoglycemia resolved.  A1c 9.1% on 4/10. 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  See details above.  Patient was hospitalized for this from 4/9-5/17 and discharged on doxycycline.  Previously treated with daptomycin followed by vancomycin and oritavancin. -Neurosurgery following-plan for surgical intervention next week on 5/29 -ID following-on IV vancomycin -Pain control-pain fairly controlled on current regimen. -Continue IV Dilaudid 1 mg every 2 hours as needed severe pain -Continue MS Contin 30 mg every 12 hours and Lyrica 50 mg twice daily -Increased oxycodone to 15 mg every 4 hours as needed moderate pain on 5/23 -Increase Robaxin to 750 mg 4 times daily on 5/23 -Bowel regimen and Narcan as needed. -  Hold Eliquis.  IV heparin pending surgery.  Right foot osteomyelitis s/p right fifth ray amputation on 4/30.  Surgical wound appears DCI.  Stitches in place. -Stitches removed on 5/22 after discussion with orthopedic surgery PA -Antibiotics as above -Continue  dressing  Chronic HFrEF with recovered EF: TTE in 2020 with LVEF of 35 to 40% (60 to 65% in 2024).  Appears euvolemic on exam. Cr slightly higher than baseline. -Monitor respiratory and fluid status while on IV fluid -Hold Lasix and Entresto to allow renal recovery   Acute on chronic pain -Pain control as above.   Essential hypertension: Normotensive. -Continue carvedilol 6.25 mg p.o. twice daily   Persistent atrial fibrillation: Rate controlled.  CHA2DS2-VASc of 4. -Continue digoxin, Coreg and IV heparin.   Pure hypercholesterolemia -Continue atorvastatin 80 mg p.o. daily.   Small/moderate pleural effusion: Previously treated for pneumonia with parapneumonic effusion.  -Antibiotics as above. -Repeat imaging in 2 to 3 days or sooner as needed   Physical deconditioning -PT/OT  Constipation: Resolved. -Bowel regimen  Body mass index is 24.91 kg/m.           DVT prophylaxis:  On full dose anticoagulation  Code Status: Full code Family Communication: None at bedside Level of care: Progressive Status is: Inpatient Remains inpatient appropriate because: Discitis/osteomyelitis, acute on chronic back pain   Final disposition: CIR? Consultants:  Infectious disease Neurosurgery  35 minutes with more than 50% spent in reviewing records, counseling patient/family and coordinating care.   Sch Meds:  Scheduled Meds:  acetaminophen  1,000 mg Oral TID   atorvastatin  80 mg Oral Daily   busPIRone  15 mg Oral BID   carvedilol  6.25 mg Oral BID   digoxin  125 mcg Oral Daily   famotidine  20 mg Oral BID   fluticasone  1 spray Each Nare Daily   insulin aspart  0-5 Units Subcutaneous QHS   insulin aspart  0-9 Units Subcutaneous TID WC   lidocaine  1 patch Transdermal Q24H   methocarbamol  750 mg Oral QID   morphine  30 mg Oral Q12H   pantoprazole  40 mg Oral Daily   polyethylene glycol  17 g Oral BID   pregabalin  50 mg Oral TID   senna-docusate  1 tablet Oral BID    Continuous Infusions:  sodium chloride 75 mL/hr at 08/26/22 0626   heparin 950 Units/hr (08/26/22 0000)   vancomycin     PRN Meds:.HYDROmorphone (DILAUDID) injection, naLOXone (NARCAN)  injection, ondansetron **OR** ondansetron (ZOFRAN) IV, mouth rinse, oxyCODONE  Antimicrobials: Anti-infectives (From admission, onward)    Start     Dose/Rate Route Frequency Ordered Stop   08/26/22 2300  vancomycin (VANCOREADY) IVPB 1250 mg/250 mL        1,250 mg 166.7 mL/hr over 90 Minutes Intravenous Every 24 hours 08/26/22 1014     08/23/22 2200  vancomycin (VANCOREADY) IVPB 750 mg/150 mL  Status:  Discontinued        750 mg 150 mL/hr over 60 Minutes Intravenous Every 12 hours 08/23/22 0938 08/26/22 1014   08/23/22 1030  vancomycin (VANCOREADY) IVPB 1500 mg/300 mL        1,500 mg 150 mL/hr over 120 Minutes Intravenous  Once 08/23/22 0938 08/23/22 1312   08/20/22 2200  doxycycline (VIBRA-TABS) tablet 100 mg  Status:  Discontinued        100 mg Oral 2 times daily 08/20/22 1737 08/23/22 0927        I have personally reviewed the following  labs and images: CBC: Recent Labs  Lab 08/20/22 1614 08/21/22 0256 08/22/22 0313 08/23/22 0326 08/24/22 0255 08/25/22 0026 08/25/22 2355  WBC 11.4*   < > 9.3 9.8 10.2 9.9 10.2  NEUTROABS 9.5*  --   --   --   --   --   --   HGB 10.8*   < > 10.9* 11.0* 10.0* 9.8* 10.5*  HCT 32.5*   < > 33.9* 34.0* 29.9* 30.0* 31.5*  MCV 86.4   < > 88.7 90.7 85.2 86.0 87.7  PLT 392   < > 345 325 321 301 305   < > = values in this interval not displayed.   BMP &GFR Recent Labs  Lab 08/21/22 0256 08/22/22 0313 08/23/22 0326 08/25/22 0026 08/25/22 2355  NA 128* 133* 129* 130* 130*  K 4.0 4.1 3.9 4.0 3.9  CL 98 102 99 100 101  CO2 22 22 21* 19* 19*  GLUCOSE 264* 158* 164* 128* 158*  BUN 17 15 16  21* 18  CREATININE 1.24 1.20 1.26* 1.49* 1.31*  CALCIUM 8.5* 9.0 8.8* 9.0 9.1  MG 1.5*  --  1.9 1.8 1.7  PHOS  --   --   --  4.1 3.3   Estimated Creatinine  Clearance: 65.8 mL/min (A) (by C-G formula based on SCr of 1.31 mg/dL (H)). Liver & Pancreas: Recent Labs  Lab 08/20/22 1614 08/21/22 0256 08/25/22 0026 08/25/22 2355  AST 14* 11*  --   --   ALT 15 13  --   --   ALKPHOS 99 82  --   --   BILITOT 0.9 0.6  --   --   PROT 8.5* 7.0  --   --   ALBUMIN 2.8* 2.3* 2.2* 2.3*   No results for input(s): "LIPASE", "AMYLASE" in the last 168 hours. No results for input(s): "AMMONIA" in the last 168 hours. Diabetic: No results for input(s): "HGBA1C" in the last 72 hours. Recent Labs  Lab 08/25/22 1148 08/25/22 1621 08/25/22 2129 08/26/22 0606 08/26/22 1105  GLUCAP 126* 127* 142* 169* 139*   Cardiac Enzymes: No results for input(s): "CKTOTAL", "CKMB", "CKMBINDEX", "TROPONINI" in the last 168 hours. No results for input(s): "PROBNP" in the last 8760 hours. Coagulation Profile: No results for input(s): "INR", "PROTIME" in the last 168 hours. Thyroid Function Tests: No results for input(s): "TSH", "T4TOTAL", "FREET4", "T3FREE", "THYROIDAB" in the last 72 hours. Lipid Profile: No results for input(s): "CHOL", "HDL", "LDLCALC", "TRIG", "CHOLHDL", "LDLDIRECT" in the last 72 hours. Anemia Panel: No results for input(s): "VITAMINB12", "FOLATE", "FERRITIN", "TIBC", "IRON", "RETICCTPCT" in the last 72 hours. Urine analysis:    Component Value Date/Time   COLORURINE STRAW (A) 08/20/2022 1545   APPEARANCEUR CLEAR 08/20/2022 1545   LABSPEC 1.005 08/20/2022 1545   PHURINE 6.0 08/20/2022 1545   GLUCOSEU NEGATIVE 08/20/2022 1545   HGBUR NEGATIVE 08/20/2022 1545   BILIRUBINUR NEGATIVE 08/20/2022 1545   BILIRUBINUR negative 09/25/2019 1555   KETONESUR NEGATIVE 08/20/2022 1545   PROTEINUR 30 (A) 08/20/2022 1545   UROBILINOGEN 1.0 09/25/2019 1555   NITRITE NEGATIVE 08/20/2022 1545   LEUKOCYTESUR NEGATIVE 08/20/2022 1545   Sepsis Labs: Invalid input(s): "PROCALCITONIN", "LACTICIDVEN"  Microbiology: Recent Results (from the past 240 hour(s))   MRSA Next Gen by PCR, Nasal     Status: Abnormal   Collection Time: 08/20/22  6:16 PM   Specimen: Nasal Mucosa; Nasal Swab  Result Value Ref Range Status   MRSA by PCR Next Gen DETECTED (A) NOT DETECTED Final  Comment: RESULT CALLED TO, READ BACK BY AND VERIFIED WITH: HARRIS,D AT 2208 ON 08/20/22 BY LUZOLOP (NOTE) The GeneXpert MRSA Assay (FDA approved for NASAL specimens only), is one component of a comprehensive MRSA colonization surveillance program. It is not intended to diagnose MRSA infection nor to guide or monitor treatment for MRSA infections. Test performance is not FDA approved in patients less than 30 years old. Performed at [facility], [address] [place], [place] [address]     Radiology Studies: No results found.    Telisa Ohlsen T. Elleanor Guyett [name]  If 7PM-7AM, please contact night-coverage [email or web address] 08/26/2022, 12:42 PM

## 2022-08-26 NOTE — Progress Notes (Signed)
Pharmacy Antibiotic Note  David [name] is a 62 y.o. male admitted on 08/20/2022 with discitis/epidural abscess, hx prior disseminated MRSA infection. Pharmacy has been consulted for Vancomycin dosing.  Plan is for T5-T11 posterior fusion, T8 transpedicular debridement and decompression on 5/28. Vancomycin peak 36, trough 20 - calculated AUC above goal at 581.6. SCr bumped up to 1.49 on 5/23, now down to 1.31 (baseline ~1.1-1.2).  Plan: - Change vancomycin to 1250 mg IV q24h (calculated AUC 484) - Will continue to follow renal function, culture results, LOT, and antibiotic de-escalation plans   Height: 6' (182.9 cm) Weight: 83.3 kg (183 lb 10.3 oz) IBW/kg (Calculated) : 77.6  Temp (24hrs), Avg:98.6 F (37 C), Min:98.1 F (36.7 C), Max:99.4 F (37.4 C)  Recent Labs  Lab 08/21/22 0256 08/22/22 0313 08/23/22 0326 08/24/22 0255 08/25/22 0026 08/25/22 2355 08/26/22 0644  WBC 9.1 9.3 9.8 10.2 9.9 10.2  --   CREATININE 1.24 1.20 1.26*  --  1.49* 1.31*  --   VANCOTROUGH  --   --   --   --   --   --  20  VANCOPEAK  --   --   --   --   --  36  --      Estimated Creatinine Clearance: 65.8 mL/min (A) (by C-G formula based on SCr of 1.31 mg/dL (H)).    No Known Allergies  Thank you for involving pharmacy in this patient's care.  Loura Back, PharmD, BCPS Clinical Pharmacist Clinical phone for 08/26/2022 is [phone] 08/26/2022 12:16 PM

## 2022-08-26 NOTE — Evaluation (Signed)
Occupational Therapy Evaluation Patient Details Name: David [name] MRN: [id number] DOB: [birth year] Today's Date: 08/26/2022   History of Present Illness Pt is a 62 y.o. male who presented 08/20/22  with hypoglycemia. MRI and CT show continued evolution of his discitis osteomyelitis with now loss of vertebral body height at T8 and some retropulsion of the inferior endplate given him moderate canal narrowing but no obvious cord compression and no obvious signal change in the cord. Plan for surgical intervention this admission, likely 5/28. 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 Pt presents now with reports of high back pain levels despite planned premedication. Pt also with noted confusion resulting in limited OT evaluation today. Educated re: back precautions, gradual initiation of movement within precaution parameters and importance of participation though pt not receptive to education today. Darco shoe for R foot still not present though pt reports wife planning to bring it today. Will follow up for acute OT s/p neurosurgery's interventions to make final DC recommendations.       Recommendations for follow up therapy are one component of a multi-disciplinary discharge planning process, led by the attending physician.  Recommendations may be updated based on patient status, additional functional criteria and insurance authorization.   Assistance Recommended at Discharge Frequent or constant Supervision/Assistance  Patient can return home with the following A lot of help with walking and/or transfers;A lot of help with bathing/dressing/bathroom    Functional Status Assessment  Patient has had a recent decline in their  functional status and demonstrates the ability to make significant improvements in function in a reasonable and predictable amount of time.  Equipment Recommendations  BSC/3in1    Recommendations for Other Services       Precautions / Restrictions Precautions Precautions: Fall;Back Precaution Comments: reviewed precautions Required Braces or Orthoses: Spinal Brace Spinal Brace: Thoracolumbosacral orthotic;Applied in sitting position Restrictions Weight Bearing Restrictions: Yes RLE Weight Bearing: Partial weight bearing RLE Partial Weight Bearing Percentage or Pounds: heel weight bearing Other Position/Activity Restrictions: with Darco shoe (not present in room; pt reports family bringing it 5/24)      Mobility Bed Mobility                    Transfers                          Balance                                           ADL either performed or assessed with clinical judgement   ADL Overall ADL's : Needs assistance/impaired Eating/Feeding: Independent   Grooming: Set up;Bed level   Upper Body Bathing: Moderate assistance;Bed level   Lower Body Bathing: Total assistance;Bed level   Upper Body Dressing : Moderate assistance;Bed level   Lower Body Dressing: Total assistance;Bed level       Toileting- Clothing Manipulation and Hygiene: Total assistance;Bed level         General ADL Comments: Limited by back pain and confusion. attempted education re: back precautions and initial strategies to minimize pain w/ bed mobility, etc  Vision Baseline Vision/History: 1 Wears glasses Ability to See in Adequate Light: 0 Adequate Patient Visual Report: No change from baseline Vision Assessment?: No apparent visual deficits     Perception     Praxis      Pertinent Vitals/Pain Pain Assessment Pain Assessment: Faces Faces Pain Scale: Hurts little more Pain Location: back Pain Descriptors / Indicators: Grimacing,  Discomfort, Guarding Pain Intervention(s): Monitored during session, Premedicated before session, Other (comment) (had oxy and dilaudid prior to OT attempt)     Hand Dominance Right   Extremity/Trunk Assessment Upper Extremity Assessment Upper Extremity Assessment: Overall WFL for tasks assessed   Lower Extremity Assessment Lower Extremity Assessment: Defer to PT evaluation   Cervical / Trunk Assessment Cervical / Trunk Assessment: Other exceptions Cervical / Trunk Exceptions: significant back pain; pending T level fusion next week   Communication Communication Communication: No difficulties   Cognition Arousal/Alertness: Awake/alert Behavior During Therapy: Anxious Overall Cognitive Status: No family/caregiver present to determine baseline cognitive functioning Area of Impairment: Memory, Safety/judgement, Problem solving, Awareness, Attention, Orientation                 Orientation Level: Disoriented to, Time Current Attention Level: Sustained Memory: Decreased recall of precautions, Decreased short-term memory   Safety/Judgement: Decreased awareness of safety, Decreased awareness of deficits Awareness: Intellectual Problem Solving: Requires verbal cues General Comments: Pt with tangential speech and needing encouragement to attempt tasks when pain medication effective. Per RN, pt with delirium overnight which was evident this AM as well with pt reporting story of how he was taken to blood clinic yesterday and they forgot and left him there. Poor attention to tasks and decreased understanding of OT eval purpose as pt not receptive to education     General Comments  RN present and aware of pt confusion    Exercises     Shoulder Instructions      Home Living Family/patient expects to be discharged to:: Private residence Living Arrangements: Spouse/significant other Available Help at Discharge: Family Type of Home: Apartment Home Access: Elevator     Home  Layout: One level     Bathroom Shower/Tub: Producer, television/film/video: Standard     Home Equipment: Rollator (4 wheels);Cane - single point;Shower seat;Grab bars - toilet;Grab bars - tub/shower   Additional Comments: pt reported a completely different home setup to OT on 5/24 in comparison to recent admission. reported to this OT, one level home with tub shower      Prior Functioning/Environment Prior Level of Function : Independent/Modified Independent;Working/employed;Driving             Mobility Comments: utilized [facility] for mobility PTA 07/12/22, pt was using a RW at d/c 08/28/22 ADLs Comments: Previously Independent to Mod I with ADLs and IADLs and was driving.        OT Problem List: Decreased strength;Decreased activity tolerance;Decreased safety awareness;Decreased knowledge of use of DME or AE;Decreased knowledge of precautions;Pain;Decreased cognition      OT Treatment/Interventions: Self-care/ADL training;Therapeutic exercise;Energy conservation;DME and/or AE instruction;Therapeutic activities;Patient/family education;Balance training    OT Goals(Current goals can be found in the care plan section) Acute Rehab OT Goals Patient Stated Goal: pain control OT Goal Formulation: With patient Time For Goal Achievement: 09/08/22 Potential to Achieve Goals: Fair  OT Frequency: Min 2X/week    Co-evaluation              AM-PAC OT "6 Clicks" Daily Activity     Outcome Measure Help from another person eating  meals?: None Help from another person taking care of personal grooming?: A Little Help from another person toileting, which includes using toliet, bedpan, or urinal?: Total Help from another person bathing (including washing, rinsing, drying)?: A Lot Help from another person to put on and taking off regular upper body clothing?: A Lot Help from another person to put on and taking off regular lower body clothing?: Total 6 Click Score: 13   End of Session Nurse  Communication: Mobility status  Activity Tolerance: Patient limited by pain;Other (comment) (limited by cognition) Patient left: in bed;with call bell/phone within reach;with bed alarm set  OT Visit Diagnosis: Unsteadiness on feet (R26.81);Muscle weakness (generalized) (M62.81);Pain Pain - part of body:  (back)                Time: 1610-9604 OT Time Calculation (min): 12 min Charges:  OT General Charges $OT Visit: 1 Visit OT Evaluation $OT Eval Moderate Complexity: 1 Mod  Bradd Canary, OTR/L Acute Rehab Services Office: [phone]   Lorre Munroe 08/26/2022, 7:56 AM

## 2022-08-27 DIAGNOSIS — I10 Essential (primary) hypertension: Secondary | ICD-10-CM | POA: Diagnosis not present

## 2022-08-27 DIAGNOSIS — Z7189 Other specified counseling: Secondary | ICD-10-CM | POA: Diagnosis not present

## 2022-08-27 DIAGNOSIS — E162 Hypoglycemia, unspecified: Secondary | ICD-10-CM | POA: Diagnosis not present

## 2022-08-27 DIAGNOSIS — Z515 Encounter for palliative care: Secondary | ICD-10-CM | POA: Diagnosis not present

## 2022-08-27 DIAGNOSIS — E119 Type 2 diabetes mellitus without complications: Secondary | ICD-10-CM | POA: Diagnosis not present

## 2022-08-27 DIAGNOSIS — E11628 Type 2 diabetes mellitus with other skin complications: Secondary | ICD-10-CM | POA: Diagnosis not present

## 2022-08-27 LAB — CBC
HCT: 30.2 % — ABNORMAL LOW (ref 39.0–52.0)
Hemoglobin: 9.7 g/dL — ABNORMAL LOW (ref 13.0–17.0)
MCH: 27.9 pg (ref 26.0–34.0)
MCHC: 32.1 g/dL (ref 30.0–36.0)
MCV: 86.8 fL (ref 80.0–100.0)
Platelets: 299 10*3/uL (ref 150–400)
RBC: 3.48 MIL/uL — ABNORMAL LOW (ref 4.22–5.81)
RDW: 14.8 % (ref 11.5–15.5)
WBC: 7.4 10*3/uL (ref 4.0–10.5)
nRBC: 0 % (ref 0.0–0.2)

## 2022-08-27 LAB — MAGNESIUM: Magnesium: 1.6 mg/dL — ABNORMAL LOW (ref 1.7–2.4)

## 2022-08-27 LAB — RENAL FUNCTION PANEL
Albumin: 2.3 g/dL — ABNORMAL LOW (ref 3.5–5.0)
Anion gap: 9 (ref 5–15)
BUN: 11 mg/dL (ref 6–20)
CO2: 20 mmol/L — ABNORMAL LOW (ref 22–32)
Calcium: 9.2 mg/dL (ref 8.9–10.3)
Chloride: 102 mmol/L (ref 98–111)
Creatinine, Ser: 1.13 mg/dL (ref 0.61–1.24)
GFR, Estimated: >60 mL/min (ref >60)
Glucose, Bld: 163 mg/dL — ABNORMAL HIGH (ref 70–99)
Phosphorus: 3.2 mg/dL (ref 2.5–4.6)
Potassium: 3.5 mmol/L (ref 3.5–5.1)
Sodium: 131 mmol/L — ABNORMAL LOW (ref 135–145)

## 2022-08-27 LAB — HEPARIN LEVEL (UNFRACTIONATED)
Heparin Unfractionated: 0.37 IU/mL (ref 0.30–0.70)
Heparin Unfractionated: 0.46 IU/mL (ref 0.30–0.70)

## 2022-08-27 LAB — GLUCOSE, CAPILLARY
Glucose-Capillary: 138 mg/dL — ABNORMAL HIGH (ref 70–99)
Glucose-Capillary: 144 mg/dL — ABNORMAL HIGH (ref 70–99)
Glucose-Capillary: 144 mg/dL — ABNORMAL HIGH (ref 70–99)
Glucose-Capillary: 179 mg/dL — ABNORMAL HIGH (ref 70–99)

## 2022-08-27 LAB — APTT
aPTT: 56 seconds — ABNORMAL HIGH (ref 24–36)
aPTT: 56 seconds — ABNORMAL HIGH (ref 24–36)
aPTT: 82 seconds — ABNORMAL HIGH (ref 24–36)

## 2022-08-27 MED ORDER — VANCOMYCIN HCL IN DEXTROSE 1-5 GM/200ML-% IV SOLN
1000.0000 mg | Freq: Two times a day (BID) | INTRAVENOUS | Status: DC
  Administered 2022-08-27 – 2022-08-30 (×7): 1000 mg via INTRAVENOUS
  Filled 2022-08-27 (×9): qty 200

## 2022-08-27 MED ORDER — CARVEDILOL 12.5 MG PO TABS
12.5000 mg | ORAL_TABLET | Freq: Two times a day (BID) | ORAL | Status: DC
  Administered 2022-08-27 – 2022-08-31 (×10): 12.5 mg via ORAL
  Filled 2022-08-27 (×11): qty 1

## 2022-08-27 MED ORDER — OXYCODONE HCL 5 MG PO TABS
15.0000 mg | ORAL_TABLET | ORAL | Status: DC | PRN
  Administered 2022-08-27 – 2022-08-29 (×6): 15 mg via ORAL
  Filled 2022-08-27 (×7): qty 3

## 2022-08-27 MED ORDER — POTASSIUM CHLORIDE CRYS ER 20 MEQ PO TBCR
40.0000 meq | EXTENDED_RELEASE_TABLET | Freq: Once | ORAL | Status: AC
  Administered 2022-08-27: 40 meq via ORAL
  Filled 2022-08-27: qty 2

## 2022-08-27 MED ORDER — MAGNESIUM SULFATE 2 GM/50ML IV SOLN
2.0000 g | Freq: Once | INTRAVENOUS | Status: AC
  Administered 2022-08-27: 2 g via INTRAVENOUS
  Filled 2022-08-27: qty 50

## 2022-08-27 MED ORDER — HYDROMORPHONE HCL 1 MG/ML IJ SOLN
1.0000 mg | INTRAMUSCULAR | Status: DC | PRN
  Administered 2022-08-27 – 2022-08-28 (×8): 1 mg via INTRAVENOUS
  Filled 2022-08-27 (×9): qty 1

## 2022-08-27 NOTE — Plan of Care (Signed)
  Problem: Pain Managment: Goal: General experience of comfort will improve Outcome: Progressing   Problem: Safety: Goal: Ability to remain free from injury will improve Outcome: Progressing   Problem: Skin Integrity: Goal: Risk for impaired skin integrity will decrease Outcome: Progressing   

## 2022-08-27 NOTE — Progress Notes (Signed)
Wasted 12mg  of remaining dilaudid PCA syringe with charge nurse Carlye Grippe RN. PCA discontinued.

## 2022-08-27 NOTE — Progress Notes (Signed)
ANTICOAGULATION CONSULT NOTE - Follow Up Consult  Pharmacy Consult for Heparin (Apixaban on HOLD) Indication: atrial fibrillation  No Known Allergies  Patient Measurements: Height: 6' (182.9 cm) Weight: 83.3 kg (183 lb 10.3 oz) IBW/kg (Calculated) : 77.6 Heparin Dosing Weight: 77.8 kg  Vital Signs: Temp: 98.4 F (36.9 C) (05/25 0348) BP: 170/91 (05/25 0348) Pulse Rate: 59 (05/25 0348)  Labs: Recent Labs    08/25/22 0026 08/25/22 1014 08/25/22 1805 08/25/22 2355 08/27/22 0338  HGB 9.8*  --   --  10.5* 9.7*  HCT 30.0*  --   --  31.5* 30.2*  PLT 301  --   --  305 299  APTT 106*   < > 69* 73* 56*  HEPARINUNFRC >1.10*  --   --  0.83* 0.46  CREATININE 1.49*  --   --  1.31* 1.13   < > = values in this interval not displayed.     Estimated Creatinine Clearance: 76.3 mL/min (by C-G formula based on SCr of 1.13 mg/dL).   Medications:  Infusions:   sodium chloride Stopped (08/27/22 0459)   heparin 950 Units/hr (08/27/22 0500)   vancomycin Stopped (08/27/22 0014)    Assessment: [name] on apixaban PTA for afib.  Pharmacy has been consulted to dose heparin while apixaban on hold for possible neurosurgery.  Last apixaban dose 5/20 PM.  Heparin level (Anti-Xa level) remains elevated as it is influenced by recent apixaban use.  Currently utilizing aPTTs to monitor heparin dosing.  aPTT 56, below goal, on 950 units/hr.  Heparin level down to 0.46 and starting to correlate, so likely can stop aPTT and monitor heparin level tomorrow. No bleeding or issues with the line noted, Hgb low but stable, platelets are normal.  Goal of Therapy:  Heparin level 0.3-0.7 units/ml aPTT 66-102 seconds Monitor platelets by anticoagulation protocol: Yes   Plan:  Increase heparin drip to 1000 units/hr aPTT level in 6h to recheck level following increase in dose Will continue daily aPTT and heparin levels until correlation. Daily CBC to monitor for bleeding.  Thank you for involving pharmacy  in this patient's care.  March Rummage, PharmD PGY2 Pharmacy Resident 08/27/2022 7:25 AM

## 2022-08-27 NOTE — Progress Notes (Signed)
Palliative Medicine Inpatient Follow Up Note   HPI: 62 y.o. male  with history of chronic CHF, persistent atrial fibrillation on anticoagulation, and T2DM. 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 He was discharged home 08/19/22 with po doxycycline and po pain medication (Oxycodone IR and Oxycontin). He was readmitted on 08/20/2022 with hypoglycemia.  Patient was hypoglycemic to 38 when EMS arrived.  He was also reporting significant back pain.  MRI thoracic spine concerning for progressive discitis/osteomyelitis at T7-T9. ID consulted and started IV vancomycin.  Neurosurgery planning on surgical intervention next week.   Palliative Medicine has been consulted for pain control and goals of care.  Today's Discussion 08/27/2022  *Please note that this is a verbal dictation therefore any spelling or grammatical errors are due to the "[employer]agon [employer] One" system interpretation.  Chart reviewed inclusive of vital signs, progress notes, laboratory results, and diagnostic images.   I met with David [name] at bedside this late morning.  He expresses to me that since he has been placed on the PCA his pain has been uncontrolled.  We discussed various ways to control his pain though he is adamant that the PCA has only made things worse for him.  We reviewed stopping the PCA and putting back on his prior regiment of pain control which she is in agreement with.  We reviewed that after surgical intervention his pain may be a lot worse though it also may be better which he understands.  David [name] does share that he is moving his bowels fairly well at this time.  He expresses that he is not feeling nauseated.  A discussion was held as a relates to resuscitation status.  At this point David [name] still would like to speak to his wife [name] with whom he  has not been able to get in touch with as of yet.  They had previously determined that he would be a DO NOT RESUSCITATE though it appears that they have not revisited this topic since his last hospital stay therefore he would like some time and Delorise Shiner for further conversations.  Created space and opportunity for patient to explore thoughts feelings and fears regarding current medical situation.  Questions and concerns addressed/Palliative Support Provided.   Objective Assessment: Vital Signs Vitals:   08/27/22 0437 08/27/22 1100  BP:  (!) 143/86  Pulse:  74  Resp: 18 15  Temp:  98.1 F (36.7 C)  SpO2: 100% 100%    Intake/Output Summary (Last 24 hours) at 08/27/2022 1137 Last data filed at 08/27/2022 0500 Gross per 24 hour  Intake 2568.84 ml  Output 2651 ml  Net -82.16 ml   Last Weight  Most recent update: 08/23/2022 11:23 PM    Weight  83.3 kg (183 lb 10.3 oz)            Gen:  Older Caucasian M in distress HEENT: moist mucous membranes CV: Regular rate and rhythm  PULM:  On RA, breathing is even and nonlabored ABD: Distended and hypoactive EXT: Missing R toe 4-5 , ace wrap on Neuro: Alert and oriented x3    SUMMARY OF RECOMMENDATIONS   Full code until patient has time to discuss it with his wife   Chaplain to  offer prayer   Symptom management as below   Ongoing supportive care   Appreciate neurosurgery involvement. Plan for surgical intervention on  Tuesday   PMT will continue to follow along   Code Status/Advance Care Planning: DNAR/DNI   Symptom Management:  Acute on Chronic Pain in the setting of Discitis: - Stop PCA - Tylenol 1g PO TID ATC - Dilaudid 1mg  IVP Q2H PRN - Robaxin 750mg  QID - Oxycodone 15mg  PO Q4H PRN - MS contine 30mg  PO Q12H  - Lyrica 50mg  TID - Lidoderm patch - K Pad  - Per Neurosurgery plan for --> surgical stabilization, debridement, and decompression ,- T5-T11 posterior fusion, T8 transpedicular debridement and decompression on  Tuesday   Opoid induced constipation: - Senna 1 Tabs PO BID - Miralax BID   Anxiety: - Buspar 15mg  PO TID   Billing based on MDM: High  ______________________________________________________________________________________ Lamarr Lulas [place] Palliative Medicine Team Team Cell Phone: [phone] Please utilize secure chat with additional questions, if there is no response within 30 minutes please call the above phone number  Palliative Medicine Team providers are available by phone from 7am to 7pm daily and can be reached through the team cell phone.  Should this patient require assistance outside of these hours, please call the patient's attending physician.

## 2022-08-27 NOTE — Progress Notes (Signed)
ANTICOAGULATION CONSULT NOTE - Follow Up Consult  Pharmacy Consult for Heparin (Apixaban on HOLD) Indication: atrial fibrillation  No Known Allergies  Patient Measurements: Height: 6' (182.9 cm) Weight: 83.3 kg (183 lb 10.3 oz) IBW/kg (Calculated) : 77.6 Heparin Dosing Weight: 77.8 kg  Vital Signs: Temp: 98.2 F (36.8 C) (05/25 1545) Temp Source: Oral (05/25 1545) BP: 149/90 (05/25 1545) Pulse Rate: 70 (05/25 1545)  Labs: Recent Labs    08/25/22 0026 08/25/22 1014 08/25/22 2355 08/27/22 0338 08/27/22 1523  HGB 9.8*  --  10.5* 9.7*  --   HCT 30.0*  --  31.5* 30.2*  --   PLT 301  --  305 299  --   APTT 106*   < > 73* 56* 56*  HEPARINUNFRC >1.10*  --  0.83* 0.46  --   CREATININE 1.49*  --  1.31* 1.13  --    < > = values in this interval not displayed.     Estimated Creatinine Clearance: 76.3 mL/min (by C-G formula based on SCr of 1.13 mg/dL).   Medications:  Infusions:   sodium chloride Stopped (08/27/22 0459)   heparin 1,000 Units/hr (08/27/22 1610)   vancomycin 1,000 mg (08/27/22 1134)    Assessment: [name] on apixaban PTA for afib.  Pharmacy has been consulted to dose heparin while apixaban on hold for possible neurosurgery.  Last apixaban dose 5/20 PM.  Heparin level (Anti-Xa level) remains elevated as it is influenced by recent apixaban use.  Currently utilizing aPTTs to monitor heparin dosing.  Heparin level and PTT have not correlated yet. PTT came back low again this PM. We will increase rate and check 6 hr levels.  Goal of Therapy:  Heparin level 0.3-0.7 units/ml aPTT 66-102 seconds Monitor platelets by anticoagulation protocol: Yes   Plan:  Increase heparin drip to 1150 units/hr aPTT/heparin level in 6h Will continue daily aPTT and heparin levels until correlation. Daily CBC to monitor for bleeding.  Ulyses Southward, PharmD, BCIDP, AAHIVP, CPP Infectious Disease Pharmacist 08/27/2022 4:11 PM

## 2022-08-27 NOTE — Progress Notes (Signed)
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 Chestine Spore on 4/25 persistent A-fib on Eliquis, DM-2, seizure disorder, HTN, HLD and chronic pain return to ED with hypoglycemia to 38.  Patient was hospitalized for disseminated MRSA bacteremia from 4/9-5/17, and discharged on p.o. doxycycline.  Patient was hypoglycemic to 38 when EMS arrived.  His hypoglycemia persisted despite intervention in ED and leading to his rehospitalization.  He also have significant back pain.  MRI thoracic spine concerning for progressive discitis/osteomyelitis at T7-T9, extensive phlegmonous change in the paravertebral soft tissues,  partial collapse of T8 vertebral body with 40% vertebral body height loss and approximately 6 mm bony retropulsion, moderate canal stenosis and mild cord edema.  Neurosurgery and ID consulted.  Patient was started on IV vancomycin.  Neurosurgery planning surgical intervention next week.  Palliative medicine consulted for assistance with pain management.  Therapy recommended CIR   Subjective: Seen and examined earlier this morning.  Reports worsening pain after switch to Dilaudid PCA yesterday.  He says his pain was fairly controlled with the regimen before transitioning to PCA.  Objective: Vitals:   08/26/22 2328 08/27/22 0348 08/27/22 0437 08/27/22 1100  BP: (!) 145/92 (!) 170/91  (!) 143/86  Pulse: 71 (!) 59  74  Resp: 17 17 18 15   Temp: 98.2 F (36.8 C) 98.4 F (36.9 C)  98.1 F (36.7 C)  TempSrc:    Axillary  SpO2: 98% 100% 100% 100%  Weight:      Height:        Examination:  GENERAL: Appears to be in distress from  pain. HEENT: MMM.  Vision and hearing grossly intact.  NECK: Supple.  No apparent JVD.  RESP:  No IWOB.  Fair aeration bilaterally. CVS:  RRR. Heart sounds normal.  ABD/GI/GU: BS+. Abd soft, NTND.  MSK/EXT:   No apparent deformity. Moves extremities. No edema.  SKIN: no apparent skin lesion or wound NEURO: Awake but not quite alert.  Oriented appropriately.  No apparent focal neuro deficit. PSYCH: Calm. Normal affect.   Procedures:  None  Microbiology summarized: MRSA PCR screen nonreactive.  Assessment and plan: Principal Problem:   Hypoglycemia Active Problems:   Essential hypertension   Insulin dependent type 2 diabetes mellitus (HCC)   Persistent atrial fibrillation (HCC)   Pure hypercholesterolemia   Cellulitis in diabetic foot (HCC)   Diskitis  Uncontrolled IDDM-2 with hypoglycemia and hyperglycemia: Hypoglycemia resolved.  A1c 9.1% on 4/10. Recent Labs  Lab 08/26/22 1105 08/26/22 1632 08/26/22 2059 08/27/22 0604 08/27/22 1101  GLUCAP 139* 123* 98 138* 144*  -Continue current insulin regimen   Disseminated MRSA bacteremia with T7-T9 discitis/osteomyelitis: MRI concerning for progressive discitis/osteomyelitis.  See details above.  Patient was hospitalized for this from 4/9-5/17 and discharged on doxycycline.  Previously treated with daptomycin followed by vancomycin and oritavancin. -Neurosurgery following-plan for surgical intervention next week on 5/29 -ID following-on IV vancomycin -Pain control-pain worse with PCA.  Palliative care stopped PCA and resumed previous regimen as below -IV Dilaudid 1 mg every 2 hours as needed severe pain -MS Contin 30 mg every 12 hours -Lyrica 50 mg twice daily -Oxycodone to 15  mg every 4 hours as needed moderate pain on 5/23 -Robaxin to 750 mg 4 times daily on 5/23 -Tylenol 1 g 3 times daily ATC -Lidoderm patch and K-pad -Bowel regimen and Narcan as needed. -Hold Eliquis.  IV heparin pending surgery.  Right foot  osteomyelitis s/p right fifth ray amputation on 4/30.  Surgical wound appears DCI.  Stitches in place. -Stitches removed on 5/22 after discussion with orthopedic surgery PA -Antibiotics as above -Continue dressing  Chronic HFrEF with recovered EF: TTE in 2020 with LVEF of 35 to 40% (60 to 65% in 2024).  Appears euvolemic on exam. Cr slightly higher than baseline. -Monitor respiratory and fluid status while on IV fluid -Continue holding Lasix and Entresto to allow further renal recovery.   Acute on chronic pain -Pain control as above.   Essential hypertension: Normotensive. -Continue carvedilol 6.25 mg p.o. twice daily   Persistent atrial fibrillation: Rate controlled.  CHA2DS2-VASc of 4. -Continue digoxin, Coreg and IV heparin.   Pure hypercholesterolemia -Continue atorvastatin 80 mg p.o. daily.   Small/moderate pleural effusion: Previously treated for pneumonia with parapneumonic effusion.  -Antibiotics as above. -Repeat imaging in 2 to 3 days or sooner as needed  Hypomagnesemia/hypokalemia -Monitor replenish as appropriate   Physical deconditioning -PT/OT  Constipation: Resolved. -Bowel regimen  Body mass index is 24.91 kg/m.           DVT prophylaxis:  On full dose anticoagulation  Code Status: Full code Family Communication: None at bedside Level of care: Progressive Status is: Inpatient Remains inpatient appropriate because: Discitis/osteomyelitis, acute on chronic back pain   Final disposition: CIR? Consultants:  Infectious disease Neurosurgery  35 minutes with more than 50% spent in reviewing records, counseling patient/family and coordinating care.   Sch Meds:  Scheduled Meds:  acetaminophen  1,000 mg Oral TID   atorvastatin  80 mg Oral Daily   busPIRone  15 mg Oral BID   carvedilol  12.5 mg Oral BID   digoxin  125 mcg Oral Daily   famotidine  20 mg Oral BID   feeding supplement  237 mL Oral BID BM   fluticasone  1 spray Each Nare Daily    insulin aspart  0-5 Units Subcutaneous QHS   insulin aspart  0-9 Units Subcutaneous TID WC   lidocaine  1 patch Transdermal Q24H   methocarbamol  750 mg Oral QID   morphine  30 mg Oral Q12H   pantoprazole  40 mg Oral Daily   polyethylene glycol  17 g Oral BID   pregabalin  50 mg Oral TID   senna-docusate  1 tablet Oral BID   Continuous Infusions:  sodium chloride Stopped (08/27/22 0459)   heparin 1,000 Units/hr (08/27/22 0808)   vancomycin 1,000 mg (08/27/22 1134)   PRN Meds:.HYDROmorphone (DILAUDID) injection, ondansetron **OR** ondansetron (ZOFRAN) IV, mouth rinse, oxyCODONE  Antimicrobials: Anti-infectives (From admission, onward)    Start     Dose/Rate Route Frequency Ordered Stop   08/27/22 1041  vancomycin (VANCOCIN) IVPB 1000 mg/200 mL premix        1,000 mg 200 mL/hr over 60 Minutes Intravenous Every 12 hours 08/27/22 0802     08/26/22 2300  vancomycin (VANCOREADY) IVPB 1250 mg/250 mL  Status:  Discontinued        1,250 mg 166.7 mL/hr over 90 Minutes Intravenous Every 24 hours 08/26/22 1014 08/27/22 0802   08/23/22 2200  vancomycin (VANCOREADY) IVPB 750 mg/150 mL  Status:  Discontinued        750 mg 150  mL/hr over 60 Minutes Intravenous Every 12 hours 08/23/22 0938 08/26/22 1014   08/23/22 1030  vancomycin (VANCOREADY) IVPB 1500 mg/300 mL        1,500 mg 150 mL/hr over 120 Minutes Intravenous  Once 08/23/22 0938 08/23/22 1312   08/20/22 2200  doxycycline (VIBRA-TABS) tablet 100 mg  Status:  Discontinued        100 mg Oral 2 times daily 08/20/22 1737 08/23/22 0927        I have personally reviewed the following labs and images: CBC: Recent Labs  Lab 08/20/22 1614 08/21/22 0256 08/23/22 0326 08/24/22 0255 08/25/22 0026 08/25/22 2355 08/27/22 0338  WBC 11.4*   < > 9.8 10.2 9.9 10.2 7.4  NEUTROABS 9.5*  --   --   --   --   --   --   HGB 10.8*   < > 11.0* 10.0* 9.8* 10.5* 9.7*  HCT 32.5*   < > 34.0* 29.9* 30.0* 31.5* 30.2*  MCV 86.4   < > 90.7 85.2 86.0 87.7  86.8  PLT 392   < > 325 321 301 305 299   < > = values in this interval not displayed.   BMP &GFR Recent Labs  Lab 08/21/22 0256 08/22/22 0313 08/23/22 0326 08/25/22 0026 08/25/22 2355 08/27/22 0338  NA 128* 133* 129* 130* 130* 131*  K 4.0 4.1 3.9 4.0 3.9 3.5  CL 98 102 99 100 101 102  CO2 22 22 21* 19* 19* 20*  GLUCOSE 264* 158* 164* 128* 158* 163*  BUN 17 15 16  21* 18 11  CREATININE 1.24 1.20 1.26* 1.49* 1.31* 1.13  CALCIUM 8.5* 9.0 8.8* 9.0 9.1 9.2  MG 1.5*  --  1.9 1.8 1.7 1.6*  PHOS  --   --   --  4.1 3.3 3.2   Estimated Creatinine Clearance: 76.3 mL/min (by C-G formula based on SCr of 1.13 mg/dL). Liver & Pancreas: Recent Labs  Lab 08/20/22 1614 08/21/22 0256 08/25/22 0026 08/25/22 2355 08/27/22 0338  AST 14* 11*  --   --   --   ALT 15 13  --   --   --   ALKPHOS 99 82  --   --   --   BILITOT 0.9 0.6  --   --   --   PROT 8.5* 7.0  --   --   --   ALBUMIN 2.8* 2.3* 2.2* 2.3* 2.3*   No results for input(s): "LIPASE", "AMYLASE" in the last 168 hours. No results for input(s): "AMMONIA" in the last 168 hours. Diabetic: No results for input(s): "HGBA1C" in the last 72 hours. Recent Labs  Lab 08/26/22 1105 08/26/22 1632 08/26/22 2059 08/27/22 0604 08/27/22 1101  GLUCAP 139* 123* 98 138* 144*   Cardiac Enzymes: No results for input(s): "CKTOTAL", "CKMB", "CKMBINDEX", "TROPONINI" in the last 168 hours. No results for input(s): "PROBNP" in the last 8760 hours. Coagulation Profile: No results for input(s): "INR", "PROTIME" in the last 168 hours. Thyroid Function Tests: No results for input(s): "TSH", "T4TOTAL", "FREET4", "T3FREE", "THYROIDAB" in the last 72 hours. Lipid Profile: No results for input(s): "CHOL", "HDL", "LDLCALC", "TRIG", "CHOLHDL", "LDLDIRECT" in the last 72 hours. Anemia Panel: No results for input(s): "VITAMINB12", "FOLATE", "FERRITIN", "TIBC", "IRON", "RETICCTPCT" in the last 72 hours. Urine analysis:    Component Value Date/Time    COLORURINE STRAW (A) 08/20/2022 1545   APPEARANCEUR CLEAR 08/20/2022 1545   LABSPEC 1.005 08/20/2022 1545   PHURINE 6.0 08/20/2022 1545   GLUCOSEU NEGATIVE  08/20/2022 1545   HGBUR NEGATIVE 08/20/2022 1545   BILIRUBINUR NEGATIVE 08/20/2022 1545   BILIRUBINUR negative 09/25/2019 1555   KETONESUR NEGATIVE 08/20/2022 1545   PROTEINUR 30 (A) 08/20/2022 1545   UROBILINOGEN 1.0 09/25/2019 1555   NITRITE NEGATIVE 08/20/2022 1545   LEUKOCYTESUR NEGATIVE 08/20/2022 1545   Sepsis Labs: Invalid input(s): "PROCALCITONIN", "LACTICIDVEN"  Microbiology: Recent Results (from the past 240 hour(s))  MRSA Next Gen by PCR, Nasal     Status: Abnormal   Collection Time: 08/20/22  6:16 PM   Specimen: Nasal Mucosa; Nasal Swab  Result Value Ref Range Status   MRSA by PCR Next Gen DETECTED (A) NOT DETECTED Final    Comment: RESULT CALLED TO, READ BACK BY AND VERIFIED WITH: HARRIS,D AT 2208 ON 08/20/22 BY LUZOLOP (NOTE) The GeneXpert MRSA Assay (FDA approved for NASAL specimens only), is one component of a comprehensive MRSA colonization surveillance program. It is not intended to diagnose MRSA infection nor to guide or monitor treatment for MRSA infections. Test performance is not FDA approved in patients less than 72 years old. Performed at [facility], [address] [place], [place] [address]     Radiology Studies: No results found.    Aaleah Hirsch T. Remmy Crass [name]  If 7PM-7AM, please contact night-coverage [email or web address] 08/27/2022, 12:55 PM

## 2022-08-27 NOTE — Progress Notes (Signed)
Pharmacy Antibiotic Note  David [name] is a 62 y.o. male admitted on 08/20/2022 with discitis/epidural abscess, hx prior disseminated MRSA infection. Pharmacy has been consulted for Vancomycin dosing.  Plan is for T5-T11 posterior fusion, T8 transpedicular debridement and decompression on 5/28. Vancomycin peak 36, trough 20 - calculated AUC at goal at 581.6. SCr bumped up to 1.49 on 5/23, now down to 1.13 (baseline ~1.1-1.2).  Plan: - Change vancomycin to 1000 mg IV q12h (calculated AUC 492; Scr 1.13, IBW) - Will continue to follow renal function, culture results, LOT, and antibiotic de-escalation plans   Height: 6' (182.9 cm) Weight: 83.3 kg (183 lb 10.3 oz) IBW/kg (Calculated) : 77.6  Temp (24hrs), Avg:98.6 F (37 C), Min:98.2 F (36.8 C), Max:99.4 F (37.4 C)  Recent Labs  Lab 08/22/22 0313 08/23/22 0326 08/24/22 0255 08/25/22 0026 08/25/22 2355 08/26/22 0644 08/27/22 0338  WBC 9.3 9.8 10.2 9.9 10.2  --  7.4  CREATININE 1.20 1.26*  --  1.49* 1.31*  --  1.13  VANCOTROUGH  --   --   --   --   --  20  --   VANCOPEAK  --   --   --   --  36  --   --      Estimated Creatinine Clearance: 76.3 mL/min (by C-G formula based on SCr of 1.13 mg/dL).    No Known Allergies  Thank you for involving pharmacy in this patient's care.  David [name], PharmD PGY2 Pharmacy Resident 08/27/2022 7:57 AM

## 2022-08-28 DIAGNOSIS — E162 Hypoglycemia, unspecified: Secondary | ICD-10-CM | POA: Diagnosis not present

## 2022-08-28 DIAGNOSIS — E119 Type 2 diabetes mellitus without complications: Secondary | ICD-10-CM | POA: Diagnosis not present

## 2022-08-28 DIAGNOSIS — E11628 Type 2 diabetes mellitus with other skin complications: Secondary | ICD-10-CM | POA: Diagnosis not present

## 2022-08-28 DIAGNOSIS — I10 Essential (primary) hypertension: Secondary | ICD-10-CM | POA: Diagnosis not present

## 2022-08-28 DIAGNOSIS — Z7189 Other specified counseling: Secondary | ICD-10-CM | POA: Diagnosis not present

## 2022-08-28 DIAGNOSIS — Z515 Encounter for palliative care: Secondary | ICD-10-CM | POA: Diagnosis not present

## 2022-08-28 LAB — HEPARIN LEVEL (UNFRACTIONATED): Heparin Unfractionated: 0.33 IU/mL (ref 0.30–0.70)

## 2022-08-28 LAB — CBC
HCT: 30.6 % — ABNORMAL LOW (ref 39.0–52.0)
Hemoglobin: 10 g/dL — ABNORMAL LOW (ref 13.0–17.0)
MCH: 27.9 pg (ref 26.0–34.0)
MCHC: 32.7 g/dL (ref 30.0–36.0)
MCV: 85.2 fL (ref 80.0–100.0)
Platelets: 273 10*3/uL (ref 150–400)
RBC: 3.59 MIL/uL — ABNORMAL LOW (ref 4.22–5.81)
RDW: 15 % (ref 11.5–15.5)
WBC: 7.4 10*3/uL (ref 4.0–10.5)
nRBC: 0 % (ref 0.0–0.2)

## 2022-08-28 LAB — APTT: aPTT: 86 seconds — ABNORMAL HIGH (ref 24–36)

## 2022-08-28 LAB — GLUCOSE, CAPILLARY
Glucose-Capillary: 144 mg/dL — ABNORMAL HIGH (ref 70–99)
Glucose-Capillary: 169 mg/dL — ABNORMAL HIGH (ref 70–99)
Glucose-Capillary: 166 mg/dL — ABNORMAL HIGH (ref 70–99)
Glucose-Capillary: 170 mg/dL — ABNORMAL HIGH (ref 70–99)

## 2022-08-28 NOTE — Progress Notes (Signed)
ANTICOAGULATION CONSULT NOTE - Follow Up Consult  Pharmacy Consult for Heparin (Apixaban on HOLD) Indication: atrial fibrillation  No Known Allergies  Patient Measurements: Height: 6' (182.9 cm) Weight: 83.3 kg (183 lb 10.3 oz) IBW/kg (Calculated) : 77.6 Heparin Dosing Weight: 77.8 kg  Vital Signs: Temp: 97.8 F (36.6 C) (05/26 0337) BP: 140/89 (05/26 0337) Pulse Rate: 69 (05/26 0337)  Labs: Recent Labs    08/25/22 2355 08/27/22 0338 08/27/22 1523 08/27/22 2330 08/28/22 0459  HGB 10.5* 9.7*  --   --  10.0*  HCT 31.5* 30.2*  --   --  30.6*  PLT 305 299  --   --  273  APTT 73* 56* 56* 82* 86*  HEPARINUNFRC 0.83* 0.46  --  0.37 0.33  CREATININE 1.31* 1.13  --   --   --      Estimated Creatinine Clearance: 76.3 mL/min (by C-G formula based on SCr of 1.13 mg/dL).   Medications:  Infusions:   sodium chloride 75 mL/hr at 08/28/22 1610   heparin 1,150 Units/hr (08/28/22 0500)   vancomycin 1,000 mg (08/27/22 2224)    Assessment: [name] on apixaban PTA for afib.  Pharmacy has been consulted to dose heparin while apixaban on hold for possible neurosurgery.  Last apixaban dose 5/20 PM.   aPTT 86, at goal, on 1150 units/hr.  Heparin level therapeutic at 0.33 and correlating, so can stop aPTT and monitor heparin level. No bleeding or issues with the line noted, Hgb low but stable, platelets are normal.  Goal of Therapy:  Heparin level 0.3-0.7 units/ml aPTT 66-102 seconds Monitor platelets by anticoagulation protocol: Yes   Plan:  Continue heparin drip to 1150 units/hr Will continue daily heparin levels. Daily CBC to monitor for bleeding.  Thank you for involving pharmacy in this patient's care.  Alvia Grove, PharmD PGY2 Pharmacy Resident 08/28/2022 7:22 AM

## 2022-08-28 NOTE — Progress Notes (Signed)
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 Chestine Spore on 4/25 persistent A-fib on Eliquis, DM-2, seizure disorder, HTN, HLD and chronic pain return to ED with hypoglycemia to 38.  Patient was hospitalized for disseminated MRSA bacteremia from 4/9-5/17, and discharged on p.o. doxycycline.  Patient was hypoglycemic to 38 when EMS arrived.  His hypoglycemia persisted despite intervention in ED and leading to his rehospitalization.  He also have significant back pain.  MRI thoracic spine concerning for progressive discitis/osteomyelitis at T7-T9, extensive phlegmonous change in the paravertebral soft tissues,  partial collapse of T8 vertebral body with 40% vertebral body height loss and approximately 6 mm bony retropulsion, moderate canal stenosis and mild cord edema.  Neurosurgery and ID consulted.  Patient was started on IV vancomycin.  Neurosurgery planning surgical intervention next week.  Palliative medicine consulted for assistance with pain management.  Therapy recommended CIR   Subjective: Seen and examined earlier this morning.  No major events overnight of this morning.  Pain improved after discontinuing PCA and adjusting pain medication.  Reports bowel movement yesterday.  Objective: Vitals:   08/27/22 2002 08/27/22 2338 08/28/22 0337 08/28/22 0700  BP: 131/89 (!) 145/91 (!) 140/89 110/68  Pulse: (!) 48 69 69 72  Resp: 17 17 17 16   Temp: 97.8 F (36.6 C) (!) 97.5 F (36.4 C) 97.8 F (36.6 C) 97.7 F (36.5 C)  TempSrc:      SpO2: 100% 99% 99% 99%  Weight:      Height:        Examination:  GENERAL: Appears to be in  distress from pain. HEENT: MMM.  Vision and hearing grossly intact.  NECK: Supple.  No apparent JVD.  RESP:  No IWOB.  Fair aeration bilaterally. CVS:  RRR. Heart sounds normal.  ABD/GI/GU: BS+. Abd soft, NTND.  MSK/EXT:   No apparent deformity. Moves extremities. No edema.  SKIN: no apparent skin lesion or wound NEURO: Awake but not quite alert.  Oriented appropriately.  No apparent focal neuro deficit. PSYCH: Calm. Normal affect.   Procedures:  None  Microbiology summarized: MRSA PCR screen nonreactive.  Assessment and plan: Principal Problem:   Hypoglycemia Active Problems:   Essential hypertension   Insulin dependent type 2 diabetes mellitus (HCC)   Persistent atrial fibrillation (HCC)   Pure hypercholesterolemia   Cellulitis in diabetic foot (HCC)   Diskitis  Uncontrolled IDDM-2 with hypoglycemia and hyperglycemia: Hypoglycemia resolved.  A1c 9.1% on 4/10. Recent Labs  Lab 08/27/22 0604 08/27/22 1101 08/27/22 1618 08/27/22 2103 08/28/22 0610  GLUCAP 138* 144* 144* 179* 144*  -Continue current insulin regimen   Disseminated MRSA bacteremia with T7-T9 discitis/osteomyelitis: MRI concerning for progressive discitis/osteomyelitis.  See details above.  Patient was hospitalized for this from 4/9-5/17 and discharged on doxycycline.  Previously treated with daptomycin followed by vancomycin and oritavancin. -Neurosurgery following-plan for surgical intervention next week on 5/29 -ID following-on IV vancomycin -Pain control-pain worse with PCA.  Palliative care stopped PCA and resumed previous regimen as below -IV Dilaudid 1 mg every 2 hours as needed severe pain -MS Contin 30 mg every 12 hours -Lyrica 50 mg twice daily -Oxycodone to  15 mg every 4 hours as needed moderate pain on 5/23 -Robaxin to 750 mg 4 times daily on 5/23 -Tylenol 1 g 3 times daily ATC -Lidoderm patch and K-pad -Palliative considering ketamine if logistic allows -Bowel regimen and Narcan as  needed. -Hold Eliquis.  IV heparin pending surgery.  Right foot osteomyelitis s/p right fifth ray amputation on 4/30.  Surgical wound appears DCI.  Stitches in place. -Stitches removed on 5/22 after discussion with orthopedic surgery PA -Antibiotics as above -Continue dressing  Chronic HFrEF with recovered EF: TTE in 2020 with LVEF of 35 to 40% (60 to 65% in 2024).  Appears euvolemic on exam. Cr slightly higher than baseline. -Monitor respiratory and fluid status while on IV fluid -Continue holding Lasix and Entresto to allow further renal recovery.   Acute on chronic pain -Pain control as above.   Essential hypertension: Normotensive. -Continue carvedilol 12.5 mg twice daily -Pain control   Persistent atrial fibrillation: Rate controlled.  CHA2DS2-VASc of 4. -Continue digoxin, Coreg and IV heparin.   Pure hypercholesterolemia -Continue atorvastatin 80 mg p.o. daily.   Small/moderate pleural effusion: Previously treated for pneumonia with parapneumonic effusion.  -Antibiotics as above.  Hypomagnesemia/hypokalemia -Monitor replenish as appropriate   Physical deconditioning -PT/OT  Constipation: Resolved. -Bowel regimen  Body mass index is 24.91 kg/m.           DVT prophylaxis:  On full dose anticoagulation  Code Status: DNR/DNI Family Communication: None at bedside Level of care: Med-Surg Status is: Inpatient Remains inpatient appropriate because: Discitis/osteomyelitis, acute on chronic back pain   Final disposition: CIR? Consultants:  Infectious disease Neurosurgery Palliative medicine  35 minutes with more than 50% spent in reviewing records, counseling patient/family and coordinating care.   Sch Meds:  Scheduled Meds:  acetaminophen  1,000 mg Oral TID   atorvastatin  80 mg Oral Daily   busPIRone  15 mg Oral BID   carvedilol  12.5 mg Oral BID   digoxin  125 mcg Oral Daily   famotidine  20 mg Oral BID   feeding supplement  237 mL Oral BID BM    fluticasone  1 spray Each Nare Daily   insulin aspart  0-5 Units Subcutaneous QHS   insulin aspart  0-9 Units Subcutaneous TID WC   lidocaine  1 patch Transdermal Q24H   methocarbamol  750 mg Oral QID   morphine  30 mg Oral Q12H   pantoprazole  40 mg Oral Daily   polyethylene glycol  17 g Oral BID   pregabalin  50 mg Oral TID   senna-docusate  1 tablet Oral BID   Continuous Infusions:  sodium chloride 75 mL/hr at 08/28/22 0608   heparin 1,150 Units/hr (08/28/22 0500)   vancomycin 1,000 mg (08/28/22 1016)   PRN Meds:.HYDROmorphone (DILAUDID) injection, ondansetron **OR** ondansetron (ZOFRAN) IV, mouth rinse, oxyCODONE  Antimicrobials: Anti-infectives (From admission, onward)    Start     Dose/Rate Route Frequency Ordered Stop   08/27/22 1041  vancomycin (VANCOCIN) IVPB 1000 mg/200 mL premix        1,000 mg 200 mL/hr over 60 Minutes Intravenous Every 12 hours 08/27/22 0802     08/26/22 2300  vancomycin (VANCOREADY) IVPB 1250 mg/250 mL  Status:  Discontinued        1,250 mg 166.7 mL/hr over 90 Minutes Intravenous Every 24 hours 08/26/22 1014 08/27/22 0802   08/23/22 2200  vancomycin (VANCOREADY) IVPB 750 mg/150 mL  Status:  Discontinued        750 mg 150  mL/hr over 60 Minutes Intravenous Every 12 hours 08/23/22 0938 08/26/22 1014   08/23/22 1030  vancomycin (VANCOREADY) IVPB 1500 mg/300 mL        1,500 mg 150 mL/hr over 120 Minutes Intravenous  Once 08/23/22 0938 08/23/22 1312   08/20/22 2200  doxycycline (VIBRA-TABS) tablet 100 mg  Status:  Discontinued        100 mg Oral 2 times daily 08/20/22 1737 08/23/22 0927        I have personally reviewed the following labs and images: CBC: Recent Labs  Lab 08/24/22 0255 08/25/22 0026 08/25/22 2355 08/27/22 0338 08/28/22 0459  WBC 10.2 9.9 10.2 7.4 7.4  HGB 10.0* 9.8* 10.5* 9.7* 10.0*  HCT 29.9* 30.0* 31.5* 30.2* 30.6*  MCV 85.2 86.0 87.7 86.8 85.2  PLT 321 301 305 299 273   BMP &GFR Recent Labs  Lab 08/22/22 0313  08/23/22 0326 08/25/22 0026 08/25/22 2355 08/27/22 0338  NA 133* 129* 130* 130* 131*  K 4.1 3.9 4.0 3.9 3.5  CL 102 99 100 101 102  CO2 22 21* 19* 19* 20*  GLUCOSE 158* 164* 128* 158* 163*  BUN 15 16 21* 18 11  CREATININE 1.20 1.26* 1.49* 1.31* 1.13  CALCIUM 9.0 8.8* 9.0 9.1 9.2  MG  --  1.9 1.8 1.7 1.6*  PHOS  --   --  4.1 3.3 3.2   Estimated Creatinine Clearance: 76.3 mL/min (by C-G formula based on SCr of 1.13 mg/dL). Liver & Pancreas: Recent Labs  Lab 08/25/22 0026 08/25/22 2355 08/27/22 0338  ALBUMIN 2.2* 2.3* 2.3*   No results for input(s): "LIPASE", "AMYLASE" in the last 168 hours. No results for input(s): "AMMONIA" in the last 168 hours. Diabetic: No results for input(s): "HGBA1C" in the last 72 hours. Recent Labs  Lab 08/27/22 0604 08/27/22 1101 08/27/22 1618 08/27/22 2103 08/28/22 0610  GLUCAP 138* 144* 144* 179* 144*   Cardiac Enzymes: No results for input(s): "CKTOTAL", "CKMB", "CKMBINDEX", "TROPONINI" in the last 168 hours. No results for input(s): "PROBNP" in the last 8760 hours. Coagulation Profile: No results for input(s): "INR", "PROTIME" in the last 168 hours. Thyroid Function Tests: No results for input(s): "TSH", "T4TOTAL", "FREET4", "T3FREE", "THYROIDAB" in the last 72 hours. Lipid Profile: No results for input(s): "CHOL", "HDL", "LDLCALC", "TRIG", "CHOLHDL", "LDLDIRECT" in the last 72 hours. Anemia Panel: No results for input(s): "VITAMINB12", "FOLATE", "FERRITIN", "TIBC", "IRON", "RETICCTPCT" in the last 72 hours. Urine analysis:    Component Value Date/Time   COLORURINE STRAW (A) 08/20/2022 1545   APPEARANCEUR CLEAR 08/20/2022 1545   LABSPEC 1.005 08/20/2022 1545   PHURINE 6.0 08/20/2022 1545   GLUCOSEU NEGATIVE 08/20/2022 1545   HGBUR NEGATIVE 08/20/2022 1545   BILIRUBINUR NEGATIVE 08/20/2022 1545   BILIRUBINUR negative 09/25/2019 1555   KETONESUR NEGATIVE 08/20/2022 1545   PROTEINUR 30 (A) 08/20/2022 1545   UROBILINOGEN 1.0  09/25/2019 1555   NITRITE NEGATIVE 08/20/2022 1545   LEUKOCYTESUR NEGATIVE 08/20/2022 1545   Sepsis Labs: Invalid input(s): "PROCALCITONIN", "LACTICIDVEN"  Microbiology: Recent Results (from the past 240 hour(s))  MRSA Next Gen by PCR, Nasal     Status: Abnormal   Collection Time: 08/20/22  6:16 PM   Specimen: Nasal Mucosa; Nasal Swab  Result Value Ref Range Status   MRSA by PCR Next Gen DETECTED (A) NOT DETECTED Final    Comment: RESULT CALLED TO, READ BACK BY AND VERIFIED WITH: HARRIS,D AT 2208 ON 08/20/22 BY LUZOLOP (NOTE) The GeneXpert MRSA Assay (FDA approved for NASAL specimens only), is  one component of a comprehensive MRSA colonization surveillance program. It is not intended to diagnose MRSA infection nor to guide or monitor treatment for MRSA infections. Test performance is not FDA approved in patients less than 22 years old. Performed at [facility], [address] [place], [place] [address]     Radiology Studies: No results found.    Jamichael Knotts T. Leeya Rusconi [name]  If 7PM-7AM, please contact night-coverage [email or web address] 08/28/2022, 11:56 AM

## 2022-08-28 NOTE — Plan of Care (Signed)
  Problem: Cardiovascular: Goal: Ability to achieve and maintain adequate cardiovascular perfusion will improve Outcome: Progressing   Problem: Health Behavior/Discharge Planning: Goal: Ability to safely manage health-related needs after discharge will improve Outcome: Progressing   Problem: Safety: Goal: Ability to remain free from injury will improve Outcome: Progressing   Problem: Skin Integrity: Goal: Risk for impaired skin integrity will decrease Outcome: Progressing

## 2022-08-28 NOTE — Progress Notes (Signed)
ANTICOAGULATION CONSULT NOTE - Follow Up Consult  Pharmacy Consult for heparin Indication: atrial fibrillation  Labs: Recent Labs    08/25/22 0026 08/25/22 1014 08/25/22 2355 08/27/22 0338 08/27/22 1523 08/27/22 2330  HGB 9.8*  --  10.5* 9.7*  --   --   HCT 30.0*  --  31.5* 30.2*  --   --   PLT 301  --  305 299  --   --   APTT 106*   < > 73* 56* 56* 82*  HEPARINUNFRC >1.10*  --  0.83* 0.46  --  0.37  CREATININE 1.49*  --  1.31* 1.13  --   --    < > = values in this interval not displayed.    Assessment/Plan:  62yo male therapeutic on heparin after rate change. Will continue infusion at current rate of 1150 units/hr and confirm stable with am labs.  Vernard Gambles, PharmD, BCPS 08/28/2022 12:14 AM

## 2022-08-28 NOTE — Progress Notes (Signed)
Palliative Medicine Inpatient Follow Up Note HPI: 62 y.o. male  with history of chronic CHF, persistent atrial fibrillation on anticoagulation, and T2DM. 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 He was discharged home 08/19/22 with po doxycycline and po pain medication (Oxycodone IR and Oxycontin). He was readmitted on 08/20/2022 with hypoglycemia.  Patient was hypoglycemic to 38 when EMS arrived.  He was also reporting significant back pain.  MRI thoracic spine concerning for progressive discitis/osteomyelitis at T7-T9. ID consulted and started IV vancomycin.  Neurosurgery planning on surgical intervention next week.   Palliative Medicine has been consulted for pain control and goals of care.  Today's Discussion 08/28/2022  *Please note that this is a verbal dictation therefore any spelling or grammatical errors are due to the "[employer]agon [employer] One" system interpretation.  Chart reviewed inclusive of vital signs, progress notes, laboratory results, and diagnostic images.   I met with David [name] at bedside this morning. He was sitting upright in his bed preparing for his breakfast. He shares that we are "behind the ball" in regards to his pain management. I reviewed his medications. He shares that he is getting "no relief" at this time despite [place] increasing his opioids. He attests that his pain is better managed today than is was yesterday.  He is feeling less anxious as a result of this. He shares a tremendous fear that his pain will start to climax causing him to remain in severe pain for a prolonged period of time.  We reviewed his medications and ways to better control his pain. We discussed his needs at this time and he does feel if we can stay on top of his pain then he feels he can "tolerate" this until Tuesday.   I  spoke with David [name] about his code status. I shared my concerns if we were to place his body through a full resuscitation effort. I shared that I do not feel he would survive this well. We reviewed that he would end up in a worse position than he is now which he is understanding of. He shares that he would want want chest compressions, shocks, intubation, or aggressive measures to sustain life.   We discussed and reviewed the plan for continued care. Patient is hopeful that after his spinal surgery we will continue to have improvement in pain and increase his ability to perform bADLs.   Questions and concerns addressed/Palliative Support Provided.   Objective Assessment: Vital Signs Vitals:   08/27/22 2338 08/28/22 0337  BP: (!) 145/91 (!) 140/89  Pulse: 69 69  Resp: 17 17  Temp: (!) 97.5 F (36.4 C) 97.8 F (36.6 C)  SpO2: 99% 99%    Intake/Output Summary (Last 24 hours) at 08/28/2022 4098 Last data filed at 08/28/2022 0500 Gross per 24 hour  Intake 473.91 ml  Output 1801 ml  Net -1327.09 ml    Last Weight  Most recent update: 08/23/2022 11:23 PM    Weight  83.3 kg (183 lb 10.3 oz)            Gen:  Older Caucasian M in distress HEENT: moist mucous membranes CV: Regular rate and rhythm  PULM:  On RA, breathing is even and nonlabored ABD: Distended and hypoactive EXT: Missing R toe 4-5 , ace wrap on Neuro: Alert and oriented x3    SUMMARY OF RECOMMENDATIONS  Full code until patient has time to discuss it with his wife   Chaplain to  offer prayer   Symptom management as below   Ongoing supportive care   Appreciate neurosurgery involvement --> Plan for surgical intervention on Tuesday   PMT will continue to follow along   Code Status/Advance Care Planning: DNAR/DNI   Symptom Management:  Acute on Chronic Pain in the setting of Discitis: - Patient is an excellent candidate for a ketamine infusion which would decrease narcotic use --> Checking with the floor if this  would be possible at this time - Tylenol 1g PO TID ATC - Dilaudid 1mg  IVP Q2H PRN - Robaxin 750mg  QID - Oxycodone 15mg  PO Q4H PRN - MS contine 30mg  PO Q12H  - Lyrica 50mg  TID - Lidoderm patch - K Pad  - Per Neurosurgery plan for --> surgical stabilization, debridement, and decompression ,- T5-T11 posterior fusion, T8 transpedicular debridement and decompression on Tuesday   Opoid induced constipation: - Senna 1 Tabs PO BID - Miralax BID   Anxiety: - Buspar 15mg  PO TID   Billing based on MDM: High ______________________________________________________________________________________ David [name] Palliative Medicine Team Team Cell Phone: [phone] Please utilize secure chat with additional questions, if there is no response within 30 minutes please call the above phone number  Palliative Medicine Team providers are available by phone from 7am to 7pm daily and can be reached through the team cell phone.  Should this patient require assistance outside of these hours, please call the patient's attending physician.

## 2022-08-29 DIAGNOSIS — E162 Hypoglycemia, unspecified: Secondary | ICD-10-CM | POA: Diagnosis not present

## 2022-08-29 DIAGNOSIS — Z515 Encounter for palliative care: Secondary | ICD-10-CM | POA: Diagnosis not present

## 2022-08-29 DIAGNOSIS — E11628 Type 2 diabetes mellitus with other skin complications: Secondary | ICD-10-CM | POA: Diagnosis not present

## 2022-08-29 DIAGNOSIS — E119 Type 2 diabetes mellitus without complications: Secondary | ICD-10-CM | POA: Diagnosis not present

## 2022-08-29 DIAGNOSIS — Z7189 Other specified counseling: Secondary | ICD-10-CM | POA: Diagnosis not present

## 2022-08-29 DIAGNOSIS — I10 Essential (primary) hypertension: Secondary | ICD-10-CM | POA: Diagnosis not present

## 2022-08-29 LAB — CBC
HCT: 28.9 % — ABNORMAL LOW (ref 39.0–52.0)
Hemoglobin: 9.4 g/dL — ABNORMAL LOW (ref 13.0–17.0)
MCH: 28 pg (ref 26.0–34.0)
MCHC: 32.5 g/dL (ref 30.0–36.0)
MCV: 86 fL (ref 80.0–100.0)
Platelets: 262 10*3/uL (ref 150–400)
RBC: 3.36 MIL/uL — ABNORMAL LOW (ref 4.22–5.81)
RDW: 15.3 % (ref 11.5–15.5)
WBC: 7.2 10*3/uL (ref 4.0–10.5)
nRBC: 0 % (ref 0.0–0.2)

## 2022-08-29 LAB — GLUCOSE, CAPILLARY
Glucose-Capillary: 179 mg/dL — ABNORMAL HIGH (ref 70–99)
Glucose-Capillary: 176 mg/dL — ABNORMAL HIGH (ref 70–99)
Glucose-Capillary: 190 mg/dL — ABNORMAL HIGH (ref 70–99)

## 2022-08-29 LAB — PROTIME-INR
INR: 1.2 (ref 0.8–1.2)
Prothrombin Time: 15 seconds (ref 11.4–15.2)

## 2022-08-29 LAB — BASIC METABOLIC PANEL
Anion gap: 7 (ref 5–15)
BUN: 7 mg/dL (ref 6–20)
CO2: 21 mmol/L — ABNORMAL LOW (ref 22–32)
Calcium: 8.6 mg/dL — ABNORMAL LOW (ref 8.9–10.3)
Chloride: 105 mmol/L (ref 98–111)
Creatinine, Ser: 0.97 mg/dL (ref 0.61–1.24)
GFR, Estimated: >60 mL/min (ref >60)
Glucose, Bld: 182 mg/dL — ABNORMAL HIGH (ref 70–99)
Potassium: 3.8 mmol/L (ref 3.5–5.1)
Sodium: 133 mmol/L — ABNORMAL LOW (ref 135–145)

## 2022-08-29 LAB — HEPARIN LEVEL (UNFRACTIONATED)
Heparin Unfractionated: 0.22 IU/mL — ABNORMAL LOW (ref 0.30–0.70)
Heparin Unfractionated: 0.27 IU/mL — ABNORMAL LOW (ref 0.30–0.70)
Heparin Unfractionated: 0.34 IU/mL (ref 0.30–0.70)

## 2022-08-29 LAB — ABO/RH: ABO/RH(D): O NEG

## 2022-08-29 MED ORDER — HYDROMORPHONE HCL 1 MG/ML IJ SOLN
0.5000 mg | INTRAMUSCULAR | Status: DC | PRN
  Administered 2022-08-29 – 2022-08-31 (×11): 0.5 mg via INTRAVENOUS
  Filled 2022-08-29 (×11): qty 0.5

## 2022-08-29 MED ORDER — OXYCODONE HCL 5 MG PO TABS: 5.0000 mg | ORAL_TABLET | ORAL | Status: DC | PRN

## 2022-08-29 MED ORDER — ACETAMINOPHEN 500 MG PO TABS: 1000.0000 mg | ORAL_TABLET | ORAL | Status: AC

## 2022-08-29 MED ORDER — CHLORHEXIDINE GLUCONATE CLOTH 2 % EX PADS
6.0000 | MEDICATED_PAD | Freq: Once | CUTANEOUS | Status: AC
  Administered 2022-08-30: 6 via TOPICAL

## 2022-08-29 MED ORDER — GABAPENTIN 300 MG PO CAPS
300.0000 mg | ORAL_CAPSULE | ORAL | Status: AC
  Administered 2022-08-30: 300 mg via ORAL
  Filled 2022-08-29: qty 1

## 2022-08-29 MED ORDER — HYDROMORPHONE HCL 1 MG/ML IJ SOLN
0.5000 mg | INTRAMUSCULAR | Status: DC | PRN
  Administered 2022-08-29: 0.5 mg via INTRAVENOUS
  Filled 2022-08-29: qty 0.5

## 2022-08-29 MED ORDER — KETOROLAC TROMETHAMINE 30 MG/ML IJ SOLN: 30.0000 mg | Freq: Three times a day (TID) | INTRAMUSCULAR | Status: DC

## 2022-08-29 MED ORDER — OXYCODONE HCL 5 MG PO TABS
10.0000 mg | ORAL_TABLET | ORAL | Status: DC | PRN
  Administered 2022-08-29 – 2022-09-20 (×91): 10 mg via ORAL
  Filled 2022-08-29 (×95): qty 2

## 2022-08-29 MED ORDER — CEFAZOLIN SODIUM-DEXTROSE 2-4 GM/100ML-% IV SOLN
2.0000 g | INTRAVENOUS | Status: AC
  Administered 2022-08-30: 2 g via INTRAVENOUS
  Filled 2022-08-29: qty 100

## 2022-08-29 NOTE — Progress Notes (Signed)
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 Chestine Spore on 4/25 persistent A-fib on Eliquis, DM-2, seizure disorder, HTN, HLD and chronic pain return to ED with hypoglycemia to 38.  Patient was hospitalized for disseminated MRSA bacteremia from 4/9-5/17, and discharged on p.o. doxycycline.  Patient was hypoglycemic to 38 when EMS arrived.  His hypoglycemia persisted despite intervention in ED and leading to his rehospitalization.  He also have significant back pain.  MRI thoracic spine concerning for progressive discitis/osteomyelitis at T7-T9, extensive phlegmonous change in the paravertebral soft tissues,  partial collapse of T8 vertebral body with 40% vertebral body height loss and approximately 6 mm bony retropulsion, moderate canal stenosis and mild cord edema.  Neurosurgery and ID consulted.  Patient was started on IV vancomycin.  Neurosurgery planning surgical intervention next week.  Palliative medicine consulted for assistance with pain management.  Therapy recommended CIR   Subjective: Seen and examined earlier this morning.  No major events overnight of this morning  Seems to be in a lot of pain. He is moaning.  Waiting on IV pain medication. Commanded "leave me alone.  Go away."  Objective: Vitals:   08/28/22 2328 08/29/22 0518 08/29/22 0700 08/29/22 1100  BP: (!) 155/92 134/81 134/86 (!) 98/58  Pulse: 74 73 82 64  Resp: 18 14  20   Temp: 98.1 F (36.7 C) 97.7 F (36.5 C) 98.3 F (36.8 C) 98.2 F (36.8 C)  TempSrc: Oral Oral Oral Oral  SpO2: 100% 96% 98% 98%  Weight:      Height:        Examination:  GENERAL:  Appears to be in a lot of pain. HEENT: MMM.  Vision and hearing grossly intact.  NECK: Supple.  No apparent JVD.  RESP:  No IWOB.  On room air. CVS:  RRR. Heart sounds normal.  ABD/GI/GU: BS+. Abd soft, NTND.  MSK/EXT:   No apparent deformity. Moves extremities. No edema.  SKIN: no apparent skin lesion or wound NEURO: Awake and alert.  Fairly oriented.  No apparent focal neurodeficit. PSYCH: Calm. Normal affect.   Procedures:  None  Microbiology summarized: MRSA PCR screen nonreactive.  Assessment and plan: Principal Problem:   Hypoglycemia Active Problems:   Essential hypertension   Insulin dependent type 2 diabetes mellitus (HCC)   Persistent atrial fibrillation (HCC)   Pure hypercholesterolemia   Cellulitis in diabetic foot (HCC)   Diskitis  Uncontrolled IDDM-2 with hypoglycemia and hyperglycemia: Hypoglycemia resolved.  A1c 9.1% on 4/10. Recent Labs  Lab 08/28/22 1209 08/28/22 1648 08/28/22 2119 08/29/22 0608 08/29/22 1147  GLUCAP 169* 166* 170* 179* 176*  -Continue current insulin regimen   Disseminated MRSA bacteremia with T7-T9 discitis/osteomyelitis: MRI concerning for progressive discitis/osteomyelitis.  See details above.  Patient was hospitalized for this from 4/9-5/17 and discharged on doxycycline.  Previously treated with daptomycin followed by vancomycin and oritavancin. -Neurosurgery following-plan for surgical intervention next week on 5/29 -ID following-on IV vancomycin -Pain control per palliative. -IV Dilaudid 1 mg every 2 hours as needed severe pain -MS Contin 30 mg every 12 hours -Lyrica 50 mg twice daily -Oxycodone to 15 mg every 4 hours as  needed moderate pain on 5/23 -Robaxin to 750 mg 4 times daily on 5/23 -Tylenol 1 g 3 times daily ATC -Lidoderm patch and K-pad -Palliative considering ketamine if logistic allows -Bowel regimen and Narcan as needed. -Hold Eliquis.  IV heparin pending surgery.  Right foot osteomyelitis s/p right fifth ray  amputation on 4/30.  Surgical wound appears DCI.  Stitches in place. -Stitches removed on 5/22 after discussion with orthopedic surgery PA -Antibiotics as above -Continue dressing  Chronic HFrEF with recovered EF: TTE in 2020 with LVEF of 35 to 40% (60 to 65% in 2024).  Appears euvolemic on exam. Cr slightly higher than baseline. -Monitor respiratory and fluid status while on IV fluid -Continue holding Lasix and Entresto to allow further renal recovery.  AKI: Likely due to diuretics, Entresto and poor p.o. intake.  Resolved. Recent Labs    08/15/22 0400 08/17/22 0733 08/20/22 1614 08/21/22 0256 08/22/22 0313 08/23/22 0326 08/25/22 0026 08/25/22 2355 08/27/22 0338 08/29/22 0415  BUN 15 15 15 17 15 16  21* 18 11 7   CREATININE 1.52* 1.33* 1.20 1.24 1.20 1.26* 1.49* 1.31* 1.13 0.97  -Continue holding Lasix and Entresto.  Acute on chronic pain -Pain control as above.   Essential hypertension: Normotensive. -Continue carvedilol 12.5 mg twice daily -Continue holding Lasix and Entresto. -Pain control   Persistent atrial fibrillation: Rate controlled.  CHA2DS2-VASc of 4. -Continue digoxin, Coreg and IV heparin.   Pure hypercholesterolemia -Continue atorvastatin 80 mg p.o. daily.   Small/moderate pleural effusion: Previously treated for pneumonia with parapneumonic effusion.  -Antibiotics as above.  Hypomagnesemia/hypokalemia -Monitor replenish as appropriate   Physical deconditioning -PT/OT-recommended CIR  Constipation: Resolved. -Bowel regimen  Body mass index is 24.91 kg/m.           DVT prophylaxis:  On full dose anticoagulation  Code Status: DNR/DNI Family Communication: None at bedside Level of care: Med-Surg Status is: Inpatient Remains inpatient appropriate because: Discitis/osteomyelitis, acute on chronic back pain   Final disposition: CIR? Consultants:  Infectious disease Neurosurgery Palliative medicine  35 minutes with more than 50% spent  in reviewing records, counseling patient/family and coordinating care.   Sch Meds:  Scheduled Meds:  acetaminophen  1,000 mg Oral TID   atorvastatin  80 mg Oral Daily   busPIRone  15 mg Oral BID   carvedilol  12.5 mg Oral BID   digoxin  125 mcg Oral Daily   famotidine  20 mg Oral BID   feeding supplement  237 mL Oral BID BM   fluticasone  1 spray Each Nare Daily   insulin aspart  0-5 Units Subcutaneous QHS   insulin aspart  0-9 Units Subcutaneous TID WC   lidocaine  1 patch Transdermal Q24H   methocarbamol  750 mg Oral QID   morphine  30 mg Oral Q12H   pantoprazole  40 mg Oral Daily   polyethylene glycol  17 g Oral BID   pregabalin  50 mg Oral TID   senna-docusate  1 tablet Oral BID   Continuous Infusions:  sodium chloride 75 mL/hr at 08/29/22 0606   heparin 1,150 Units/hr (08/29/22 0606)   vancomycin 1,000 mg (08/29/22 0926)   PRN Meds:.HYDROmorphone (DILAUDID) injection, ondansetron **OR** ondansetron (ZOFRAN) IV, mouth rinse, oxyCODONE, oxyCODONE  Antimicrobials: Anti-infectives (From admission, onward)    Start     Dose/Rate Route Frequency Ordered Stop   08/27/22 1041  vancomycin (VANCOCIN) IVPB 1000 mg/200 mL premix        1,000 mg 200 mL/hr over 60 Minutes Intravenous Every  12 hours 08/27/22 0802     08/26/22 2300  vancomycin (VANCOREADY) IVPB 1250 mg/250 mL  Status:  Discontinued        1,250 mg 166.7 mL/hr over 90 Minutes Intravenous Every 24 hours 08/26/22 1014 08/27/22 0802   08/23/22 2200  vancomycin (VANCOREADY) IVPB 750 mg/150 mL  Status:  Discontinued        750 mg 150 mL/hr over 60 Minutes Intravenous Every 12 hours 08/23/22 0938 08/26/22 1014   08/23/22 1030  vancomycin (VANCOREADY) IVPB 1500 mg/300 mL        1,500 mg 150 mL/hr over 120 Minutes Intravenous  Once 08/23/22 0938 08/23/22 1312   08/20/22 2200  doxycycline (VIBRA-TABS) tablet 100 mg  Status:  Discontinued        100 mg Oral 2 times daily 08/20/22 1737 08/23/22 0927        I have  personally reviewed the following labs and images: CBC: Recent Labs  Lab 08/25/22 0026 08/25/22 2355 08/27/22 0338 08/28/22 0459 08/29/22 0415  WBC 9.9 10.2 7.4 7.4 7.2  HGB 9.8* 10.5* 9.7* 10.0* 9.4*  HCT 30.0* 31.5* 30.2* 30.6* 28.9*  MCV 86.0 87.7 86.8 85.2 86.0  PLT 301 305 299 273 262   BMP &GFR Recent Labs  Lab 08/23/22 0326 08/25/22 0026 08/25/22 2355 08/27/22 0338 08/29/22 0415  NA 129* 130* 130* 131* 133*  K 3.9 4.0 3.9 3.5 3.8  CL 99 100 101 102 105  CO2 21* 19* 19* 20* 21*  GLUCOSE 164* 128* 158* 163* 182*  BUN 16 21* 18 11 7   CREATININE 1.26* 1.49* 1.31* 1.13 0.97  CALCIUM 8.8* 9.0 9.1 9.2 8.6*  MG 1.9 1.8 1.7 1.6*  --   PHOS  --  4.1 3.3 3.2  --    Estimated Creatinine Clearance: 88.9 mL/min (by C-G formula based on SCr of 0.97 mg/dL). Liver & Pancreas: Recent Labs  Lab 08/25/22 0026 08/25/22 2355 08/27/22 0338  ALBUMIN 2.2* 2.3* 2.3*   No results for input(s): "LIPASE", "AMYLASE" in the last 168 hours. No results for input(s): "AMMONIA" in the last 168 hours. Diabetic: No results for input(s): "HGBA1C" in the last 72 hours. Recent Labs  Lab 08/28/22 1209 08/28/22 1648 08/28/22 2119 08/29/22 0608 08/29/22 1147  GLUCAP 169* 166* 170* 179* 176*   Cardiac Enzymes: No results for input(s): "CKTOTAL", "CKMB", "CKMBINDEX", "TROPONINI" in the last 168 hours. No results for input(s): "PROBNP" in the last 8760 hours. Coagulation Profile: No results for input(s): "INR", "PROTIME" in the last 168 hours. Thyroid Function Tests: No results for input(s): "TSH", "T4TOTAL", "FREET4", "T3FREE", "THYROIDAB" in the last 72 hours. Lipid Profile: No results for input(s): "CHOL", "HDL", "LDLCALC", "TRIG", "CHOLHDL", "LDLDIRECT" in the last 72 hours. Anemia Panel: No results for input(s): "VITAMINB12", "FOLATE", "FERRITIN", "TIBC", "IRON", "RETICCTPCT" in the last 72 hours. Urine analysis:    Component Value Date/Time   COLORURINE STRAW (A) 08/20/2022 1545    APPEARANCEUR CLEAR 08/20/2022 1545   LABSPEC 1.005 08/20/2022 1545   PHURINE 6.0 08/20/2022 1545   GLUCOSEU NEGATIVE 08/20/2022 1545   HGBUR NEGATIVE 08/20/2022 1545   BILIRUBINUR NEGATIVE 08/20/2022 1545   BILIRUBINUR negative 09/25/2019 1555   KETONESUR NEGATIVE 08/20/2022 1545   PROTEINUR 30 (A) 08/20/2022 1545   UROBILINOGEN 1.0 09/25/2019 1555   NITRITE NEGATIVE 08/20/2022 1545   LEUKOCYTESUR NEGATIVE 08/20/2022 1545   Sepsis Labs: Invalid input(s): "PROCALCITONIN", "LACTICIDVEN"  Microbiology: Recent Results (from the past 240 hour(s))  MRSA Next Gen by PCR, Nasal  Status: Abnormal   Collection Time: 08/20/22  6:16 PM   Specimen: Nasal Mucosa; Nasal Swab  Result Value Ref Range Status   MRSA by PCR Next Gen DETECTED (A) NOT DETECTED Final    Comment: RESULT CALLED TO, READ BACK BY AND VERIFIED WITH: HARRIS,D AT 2208 ON 08/20/22 BY LUZOLOP (NOTE) The GeneXpert MRSA Assay (FDA approved for NASAL specimens only), is one component of a comprehensive MRSA colonization surveillance program. It is not intended to diagnose MRSA infection nor to guide or monitor treatment for MRSA infections. Test performance is not FDA approved in patients less than 27 years old. Performed at [facility], [address], [place], [place] [address]     Radiology Studies: No results found.    Latonya Knight T. Tory Septer [name]  If 7PM-7AM, please contact night-coverage [email or web address] 08/29/2022, 2:07 PM

## 2022-08-29 NOTE — Progress Notes (Signed)
Patient ID: David [name], male   DOB: 26-Aug-1960, 62 y.o.   MRN: [id number] Vital signs are stable patient is awake and alert he is to have surgery tomorrow about noon time with Dr. Maisie Fus to stabilize and decompress his thoracic spine.  He is eager to proceed with this.

## 2022-08-29 NOTE — Progress Notes (Signed)
ANTICOAGULATION CONSULT NOTE - Follow Up Consult  Pharmacy Consult for Heparin (Apixaban on HOLD) Indication: atrial fibrillation  No Known Allergies  Patient Measurements: Height: 6' (182.9 cm) Weight: 83.3 kg (183 lb 10.3 oz) IBW/kg (Calculated) : 77.6 Heparin Dosing Weight: 77.8 kg  Vital Signs: Temp: 98.2 F (36.8 C) (05/27 1100) Temp Source: Oral (05/27 1100) BP: 98/58 (05/27 1100) Pulse Rate: 64 (05/27 1100)  Labs: Recent Labs    08/27/22 0338 08/27/22 1523 08/27/22 2330 08/28/22 0459 08/29/22 0415  HGB 9.7*  --   --  10.0* 9.4*  HCT 30.2*  --   --  30.6* 28.9*  PLT 299  --   --  273 262  APTT 56* 56* 82* 86*  --   HEPARINUNFRC 0.46  --  0.37 0.33 0.22*  CREATININE 1.13  --   --   --  0.97     Estimated Creatinine Clearance: 88.9 mL/min (by C-G formula based on SCr of 0.97 mg/dL).   Medications:  Infusions:   sodium chloride 75 mL/hr at 08/29/22 0606   heparin 1,150 Units/hr (08/29/22 0606)   vancomycin 1,000 mg (08/29/22 4540)    Assessment: [name] on apixaban PTA for afib.  Pharmacy has been consulted to dose heparin while apixaban on hold for possible neurosurgery.  Last apixaban dose 5/20 PM.   Heparin level was low this AM at 0.22 after being therapeutic twice. Second heparin came back slightly subtherapeutic at 0.27. No signs or symptoms of bleeding or issues with the line noted. Hgb 9-10s and platelets are within normal limits and stable.  Goal of Therapy:  Heparin level 0.3-0.7 units/ml aPTT 66-102 seconds Monitor platelets by anticoagulation protocol: Yes   Plan:  Increase heparin drip to 1450 units/hr Heparin level in 6 hours Will continue daily heparin levels. Daily CBC to monitor for bleeding.  Thank you for involving pharmacy in this patient's care.  Alvia Grove, PharmD PGY2 Pharmacy Resident 08/29/2022 1:28 PM

## 2022-08-29 NOTE — Progress Notes (Signed)
Physical Therapy Treatment Patient Details Name: David [name] MRN: [id number] DOB: 25-Jan-1961 Today's Date: 08/29/2022   History of Present Illness Pt is a 62 y.o. male who presented 08/20/22  with hypoglycemia. MRI and CT show continued evolution of his discitis osteomyelitis with now loss of vertebral body height at T8 and some retropulsion of the inferior endplate given him moderate canal narrowing but no obvious cord compression and no obvious signal change in the cord. Plan for surgical intervention this admission. 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    PT Comments    Pt determined to mobilize today before surgery tomorrow. Needed mod A to come to EOB. Tolerated 5 mins sitting 5 mins. Pt stood EOB with mod A +2 to RW. Needed assist for power up as well as control at L knee due to buckling and weakness BLE's. Pt with increased sway and reported dizziness with each bout of standing. PT will continue to follow. Please provide new orders after surgery.     Recommendations for follow up therapy are one component of a multi-disciplinary discharge planning process, led by the attending physician.  Recommendations may be updated based on patient status, additional functional criteria and insurance authorization.  Follow Up Recommendations       Assistance Recommended at Discharge Frequent or constant Supervision/Assistance  Patient can return home with the following Assist for transportation;Two people to help with walking and/or transfers;A lot of help with bathing/dressing/bathroom;Assistance with cooking/housework;Direct supervision/assist for financial management;Direct supervision/assist for medications management   Equipment Recommendations  Other (comment) (TBA)    Recommendations for Other Services Rehab consult;OT  consult     Precautions / Restrictions Precautions Precautions: Fall;Back Precaution Booklet Issued: No Precaution Comments: reviewed precautions Required Braces or Orthoses: Spinal Brace Spinal Brace: Thoracolumbosacral orthotic;Applied in sitting position Other Brace: for use when ambulating Restrictions Weight Bearing Restrictions: Yes RLE Weight Bearing: Partial weight bearing RLE Partial Weight Bearing Percentage or Pounds: heel weight bearing Other Position/Activity Restrictions: with Darco shoe (not present in room, asked pt to get family to bring it)     Mobility  Bed Mobility Overal bed mobility: Needs Assistance Bed Mobility: Rolling, Sit to Sidelying, Sidelying to Sit Rolling: Min assist Sidelying to sit: Mod assist, +2 for safety/equipment     Sit to sidelying: Mod assist General bed mobility comments: pt needed vc's for keeping precautions and NT behind pt to prevent twisting with SL to sit and sit to SL. Mod A at trunk with SL to sit and LE's with sit to SL    Transfers Overall transfer level: Needs assistance Equipment used: Rolling walker (2 wheels) Transfers: Sit to/from Stand Sit to Stand: Mod assist, +2 physical assistance           General transfer comment: mod A +2 for power up, pt with L knee buckling and increased fwd/ back sway. 2x for strengthening. Pt dizzy with each stand    Ambulation/Gait               General Gait Details: unable due to dizziness and L knee buckling/ BLE weakness   Stairs             Wheelchair Mobility    Modified Rankin (Stroke Patients Only)       Balance Overall balance assessment: Needs assistance Sitting-balance support: Bilateral upper extremity supported, Feet  unsupported Sitting balance-Leahy Scale: Poor Sitting balance - Comments: pt tolerated sitting EOB 5 mins with min A   Standing balance support: Bilateral upper extremity supported, Reliant on assistive device for balance Standing  balance-Leahy Scale: Poor Standing balance comment: heavily reliant on RW and external support                            Cognition Arousal/Alertness: Awake/alert Behavior During Therapy: Anxious (in regards to pain) Overall Cognitive Status: No family/caregiver present to determine baseline cognitive functioning Area of Impairment: Memory, Safety/judgement, Problem solving, Awareness, Attention                   Current Attention Level: Sustained Memory: Decreased recall of precautions, Decreased short-term memory   Safety/Judgement: Decreased awareness of safety, Decreased awareness of deficits Awareness: Intellectual Problem Solving: Slow processing, Decreased initiation, Requires verbal cues General Comments: pt remained focused on session throughout, cues for effective breathing and visualization        Exercises Other Exercises Other Exercises: pursed lip breathing x10 Other Exercises: abdominal activation in supine with knees bent 7x, 3 secs each time    General Comments        Pertinent Vitals/Pain Pain Assessment Pain Assessment: Faces Faces Pain Scale: Hurts even more Pain Location: back and laterally Pain Descriptors / Indicators: Discomfort, Grimacing, Guarding Pain Intervention(s): Limited activity within patient's tolerance, Monitored during session, Repositioned, Premedicated before session    Home Living                          Prior Function            PT Goals (current goals can now be found in the care plan section) Acute Rehab PT Goals Patient Stated Goal: to reduce pain PT Goal Formulation: With patient Time For Goal Achievement: 09/08/22 Potential to Achieve Goals: Good Progress towards PT goals: Progressing toward goals    Frequency    Min 3X/week      PT Plan Current plan remains appropriate    Co-evaluation              AM-PAC PT "6 Clicks" Mobility   Outcome Measure  Help needed turning  from your back to your side while in a flat bed without using bedrails?: A Little Help needed moving from lying on your back to sitting on the side of a flat bed without using bedrails?: A Lot Help needed moving to and from a bed to a chair (including a wheelchair)?: Total Help needed standing up from a chair using your arms (e.g., wheelchair or bedside chair)?: Total Help needed to walk in hospital room?: Total Help needed climbing 3-5 steps with a railing? : Total 6 Click Score: 9    End of Session   Activity Tolerance: Patient limited by pain Patient left: in bed;with call bell/phone within reach;with bed alarm set Nurse Communication: Mobility status PT Visit Diagnosis: Difficulty in walking, not elsewhere classified (R26.2);Other abnormalities of gait and mobility (R26.89);Muscle weakness (generalized) (M62.81);Pain Pain - Right/Left: Right Pain - part of body: Ankle and joints of foot (back)     Time: 1610-9604 PT Time Calculation (min) (ACUTE ONLY): 17 min  Charges:  $Therapeutic Activity: 8-22 mins                     Lyanne Co, PT  Acute Rehab Services Secure chat preferred Office [phone]  Taren Dymek L Ezequiel Macauley 08/29/2022, 2:00 PM

## 2022-08-29 NOTE — Progress Notes (Signed)
ANTICOAGULATION CONSULT NOTE - Follow Up Consult  Pharmacy Consult for heparin Indication: atrial fibrillation  Labs: Recent Labs    08/27/22 0338 08/27/22 1523 08/27/22 2330 08/28/22 0459 08/29/22 0415  HGB 9.7*  --   --  10.0* 9.4*  HCT 30.2*  --   --  30.6* 28.9*  PLT 299  --   --  273 262  APTT 56* 56* 82* 86*  --   HEPARINUNFRC 0.46  --  0.37 0.33 0.22*  CREATININE 1.13  --   --   --  0.97    Assessment: 61yo male subtherapeutic on heparin after two levels at goal, likely d/t DOAC clearing; no infusion issues or signs of bleeding per RN.  Goal of Therapy:  Heparin level 0.3-0.7 units/ml   Plan:  Increase heparin infusion by 2 units/kg/hr to 1300 units/hr. Check level in 6 hours.   Vernard Gambles, PharmD, BCPS 08/29/2022 6:03 AM

## 2022-08-29 NOTE — Progress Notes (Signed)
ANTICOAGULATION CONSULT NOTE - Follow Up Consult  Pharmacy Consult for Heparin (Apixaban on HOLD) Indication: atrial fibrillation  No Known Allergies  Patient Measurements: Height: 6' (182.9 cm) Weight: 83.3 kg (183 lb 10.3 oz) IBW/kg (Calculated) : 77.6 Heparin Dosing Weight: 77.8 kg  Vital Signs: Temp: 98.3 F (36.8 C) (05/27 1500) Temp Source: Oral (05/27 1500) BP: 138/84 (05/27 1500) Pulse Rate: 66 (05/27 1500)  Labs: Recent Labs    08/27/22 0338 08/27/22 1523 08/27/22 2330 08/28/22 0459 08/29/22 0415 08/29/22 1307 08/29/22 2011  HGB 9.7*  --   --  10.0* 9.4*  --   --   HCT 30.2*  --   --  30.6* 28.9*  --   --   PLT 299  --   --  273 262  --   --   APTT 56* 56* 82* 86*  --   --   --   LABPROT  --   --   --   --   --   --  15.0  INR  --   --   --   --   --   --  1.2  HEPARINUNFRC 0.46  --  0.37 0.33 0.22* 0.27* 0.34  CREATININE 1.13  --   --   --  0.97  --   --      Estimated Creatinine Clearance: 88.9 mL/min (by C-G formula based on SCr of 0.97 mg/dL).   Medications:  Infusions:   sodium chloride 75 mL/hr at 08/29/22 1900   [START ON 08/30/2022]  ceFAZolin (ANCEF) IV     heparin 1,450 Units/hr (08/29/22 1900)   vancomycin Stopped (08/29/22 1026)    Assessment: [name] on apixaban PTA for afib.  Pharmacy has been consulted to dose heparin while apixaban on hold for possible neurosurgery.  Last apixaban dose 5/20 PM.   Heparin level therapeutic at 0.34 after last rate increase and currently on heparin 1450 units/hour. Heparin levels have historically been variable this admission. Will increase by 50 units/hour to attempt to push further into therapeutic range. Will get a confirmatory level in 6 hours. No signs or symptoms of bleeding or issues with the line noted. Hgb 9-10s and platelets are within normal limits and stable.  Goal of Therapy:  Heparin level 0.3-0.7 units/ml aPTT 66-102 seconds Monitor platelets by anticoagulation protocol: Yes   Plan:   Increase heparin drip to 1500 units/hr Heparin level in 6 hours Will continue daily heparin levels. Daily CBC to monitor for bleeding.  Thank you for involving pharmacy in this patient's care.  Blane Ohara, PharmD  PGY1 Pharmacy Resident

## 2022-08-29 NOTE — Progress Notes (Signed)
Palliative Medicine Inpatient Follow Up Note HPI: 62 y.o. male  with history of chronic CHF, persistent atrial fibrillation on anticoagulation, and T2DM. 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 He was discharged home 08/19/22 with po doxycycline and po pain medication (Oxycodone IR and Oxycontin). He was readmitted on 08/20/2022 with hypoglycemia.  Patient was hypoglycemic to 38 when EMS arrived.  He was also reporting significant back pain.  MRI thoracic spine concerning for progressive discitis/osteomyelitis at T7-T9. ID consulted and started IV vancomycin.  Neurosurgery planning on surgical intervention next week.   Palliative Medicine has been consulted for pain control and goals of care.  Today's Discussion 08/29/2022  *Please note that this is a verbal dictation therefore any spelling or grammatical errors are due to the "[employer]agon [employer] One" system interpretation.  Chart reviewed inclusive of vital signs, progress notes, laboratory results, and diagnostic images.   I met with patients RN, Oswaldo Done this morning. He shares that he has not given Loraine Leriche any IV dilaudid overnight. He shares that he has been firm with the patient and provided ongoing education in regards to pain medication use. Oswaldo Done feels that patients pain is inconsistent in terms of self report.   I went to bedside this morning and David [name] was noted to be sleeping soundly and appeared to be in no distress at that time.   _____________________________________ Addendum:  I met with Seab at bedside this morning. He was awake at this time. He shares with me that he feels "fine" until someone awakens him then he is very much in severe pain. He reviewed with me that it's a piercing pain in his lower back which is unrelenting. He shares that the pain does  travel around to his abdomen.   Rachit's sleep has been restful. He denies nausea. He shares he only has shortness of breath when he moves from the pain he feels.   We reviewed the hope that surgical intervention will cause some relief of the pain though it is likely that immediatly post up he will have a spike in his pain.   Aarush feels the nursing staff is not taking his pain seriously. He feels that staff are looking at him as an addict and he endorses never having taken strong drugs until his foot and back infection.   We discussed the idea of ketamine post-op in the ICU setting. Donaciano is willing to try this if it will help and does feel that he would prefer to not be on large doses of narcotics if at all possible. He recognizes the risks associated with their use. Yasiel denies a history of schizophrenia or bipolar disorder.   We reviewed the difficult road that is still ahead of Ahking - he perseverates on the thought that he does not want to be a "cripple".   Questions and concerns addressed/Palliative Support Provided.   Objective Assessment: Vital Signs Vitals:   08/28/22 2328 08/29/22 0518  BP: (!) 155/92 134/81  Pulse: 74 73  Resp: 18 14  Temp: 98.1 F (36.7 C) 97.7 F (36.5 C)  SpO2: 100% 96%    Intake/Output Summary (Last 24 hours) at 08/29/2022 0725 Last data filed at 08/29/2022 0606 Gross per 24 hour  Intake 3248.84 ml  Output 3000 ml  Net 248.84 ml    Last Weight  Most recent update: 08/23/2022 11:23 PM  Weight  83.3 kg (183 lb 10.3 oz)            Gen:  Older Caucasian M in moderate distress HEENT: moist mucous membranes CV: Regular rate and rhythm  PULM:  On RA, breathing is even and nonlabored ABD: Distended and hypoactive EXT: Missing R toe 4-5 , ace wrap on Neuro: Alert and oriented x3    SUMMARY OF RECOMMENDATIONS   DNAR/DNI   Chaplain to  offer prayer   Symptom management as below   Ongoing supportive care   Appreciate neurosurgery involvement  --> Plan for surgical intervention on Tuesday   PMT will continue to follow along   Code Status/Advance Care Planning: DNAR/DNI   Symptom Management:  Acute on Chronic Pain in the setting of Discitis: - Patient is an excellent candidate for a ketamine infusion which would decrease narcotic use --> As of this holiday weekend the floor is not able to accommodate this. Patient is open to this intervention post-op though.  - Tylenol 1g PO TID ATC - Dilaudid 0.5mg  IVP Q4H PRN breakthrough pain - Robaxin 750mg  QID - Oxycodone 5-10mg  PO Q4H PRN moderate-severe pain - MS contine 30mg  PO Q12H  - Lyrica 50mg  TID - Lidoderm patch - K Pad  - Per Neurosurgery plan for --> surgical stabilization, debridement, and decompression ,- T5-T11 posterior fusion, T8 transpedicular debridement and decompression on Tuesday   Opoid induced constipation: Last BM 5/26 - Senna 1 Tabs PO BID - Miralax BID   Anxiety: - Buspar 15mg  PO TID  Total Time: 59 Billing based on MDM: High ______________________________________________________________________________________ [name] Palliative Medicine Team Team Cell Phone: [phone] Please utilize secure chat with additional questions, if there is no response within 30 minutes please call the above phone number  Palliative Medicine Team providers are available by phone from 7am to 7pm daily and can be reached through the team cell phone.  Should this patient require assistance outside of these hours, please call the patient's attending physician.

## 2022-08-29 NOTE — Progress Notes (Signed)
This chaplain attempted spiritual care visit for prayer. Pt. Is sleeping. This chaplain will attempt a revisit before Pt. surgery on Tuesday.  Chaplain Stephanie Acre [phone]

## 2022-08-30 ENCOUNTER — Encounter (HOSPITAL_COMMUNITY): Payer: Self-pay | Admitting: Internal Medicine

## 2022-08-30 ENCOUNTER — Inpatient Hospital Stay (HOSPITAL_COMMUNITY): Payer: Medicaid Other | Admitting: Certified Registered"

## 2022-08-30 ENCOUNTER — Encounter (HOSPITAL_COMMUNITY)
Admission: EM | Disposition: A | Discharge status: Home / Self Care | Payer: Self-pay | Source: Home / Self Care | Attending: Family Medicine

## 2022-08-30 ENCOUNTER — Inpatient Hospital Stay (HOSPITAL_COMMUNITY): Payer: Medicaid Other

## 2022-08-30 ENCOUNTER — Inpatient Hospital Stay (HOSPITAL_COMMUNITY): Admit: 2022-08-30 | Payer: Self-pay | Admitting: Neurological Surgery

## 2022-08-30 ENCOUNTER — Other Ambulatory Visit: Payer: Self-pay

## 2022-08-30 DIAGNOSIS — I10 Essential (primary) hypertension: Secondary | ICD-10-CM | POA: Diagnosis not present

## 2022-08-30 DIAGNOSIS — M4644 Discitis, unspecified, thoracic region: Secondary | ICD-10-CM | POA: Diagnosis not present

## 2022-08-30 DIAGNOSIS — M8448XA Pathological fracture, other site, initial encounter for fracture: Secondary | ICD-10-CM | POA: Diagnosis not present

## 2022-08-30 DIAGNOSIS — M4624 Osteomyelitis of vertebra, thoracic region: Secondary | ICD-10-CM | POA: Diagnosis not present

## 2022-08-30 DIAGNOSIS — E11628 Type 2 diabetes mellitus with other skin complications: Secondary | ICD-10-CM | POA: Diagnosis not present

## 2022-08-30 DIAGNOSIS — E119 Type 2 diabetes mellitus without complications: Secondary | ICD-10-CM | POA: Diagnosis not present

## 2022-08-30 DIAGNOSIS — E162 Hypoglycemia, unspecified: Secondary | ICD-10-CM | POA: Diagnosis not present

## 2022-08-30 DIAGNOSIS — Z87891 Personal history of nicotine dependence: Secondary | ICD-10-CM

## 2022-08-30 HISTORY — PX: LAMINECTOMY WITH POSTERIOR LATERAL ARTHRODESIS LEVEL 4: SHX6338

## 2022-08-30 LAB — GLUCOSE, CAPILLARY
Glucose-Capillary: 144 mg/dL — ABNORMAL HIGH (ref 70–99)
Glucose-Capillary: 168 mg/dL — ABNORMAL HIGH (ref 70–99)
Glucose-Capillary: 142 mg/dL — ABNORMAL HIGH (ref 70–99)
Glucose-Capillary: 177 mg/dL — ABNORMAL HIGH (ref 70–99)
Glucose-Capillary: 143 mg/dL — ABNORMAL HIGH (ref 70–99)
Glucose-Capillary: 154 mg/dL — ABNORMAL HIGH (ref 70–99)
Glucose-Capillary: 176 mg/dL — ABNORMAL HIGH (ref 70–99)

## 2022-08-30 LAB — CBC
HCT: 31.1 % — ABNORMAL LOW (ref 39.0–52.0)
Hemoglobin: 10.2 g/dL — ABNORMAL LOW (ref 13.0–17.0)
MCH: 28.9 pg (ref 26.0–34.0)
MCHC: 32.8 g/dL (ref 30.0–36.0)
MCV: 88.1 fL (ref 80.0–100.0)
Platelets: 251 10*3/uL (ref 150–400)
RBC: 3.53 MIL/uL — ABNORMAL LOW (ref 4.22–5.81)
RDW: 15.5 % (ref 11.5–15.5)
WBC: 6.7 10*3/uL (ref 4.0–10.5)
nRBC: 0 % (ref 0.0–0.2)

## 2022-08-30 LAB — HEPARIN LEVEL (UNFRACTIONATED): Heparin Unfractionated: 0.45 IU/mL (ref 0.30–0.70)

## 2022-08-30 LAB — VANCOMYCIN, TROUGH: Vancomycin Tr: 22 ug/mL (ref 15–20)

## 2022-08-30 LAB — VANCOMYCIN, PEAK: Vancomycin Pk: 33 ug/mL (ref 30–40)

## 2022-08-30 SURGERY — LAMINECTOMY WITH POSTERIOR LATERAL ARTHRODESIS LEVEL 4
Anesthesia: General | Site: Back

## 2022-08-30 MED ORDER — ALBUMIN HUMAN 5 % IV SOLN: INTRAVENOUS | Status: DC | PRN

## 2022-08-30 MED ORDER — EPHEDRINE SULFATE-NACL 50-0.9 MG/10ML-% IV SOSY
PREFILLED_SYRINGE | INTRAVENOUS | Status: DC | PRN
  Administered 2022-08-30 (×6): 5 mg via INTRAVENOUS

## 2022-08-30 MED ORDER — ORAL CARE MOUTH RINSE: 15.0000 mL | Freq: Once | OROMUCOSAL | Status: DC

## 2022-08-30 MED ORDER — BUPIVACAINE HCL (PF) 0.5 % IJ SOLN
INTRAMUSCULAR | Status: DC | PRN
  Administered 2022-08-30: 30 mL

## 2022-08-30 MED ORDER — LIDOCAINE 2% (20 MG/ML) 5 ML SYRINGE
INTRAMUSCULAR | Status: DC | PRN
  Administered 2022-08-30: 100 mg via INTRAVENOUS

## 2022-08-30 MED ORDER — MIDAZOLAM HCL 2 MG/2ML IJ SOLN
INTRAMUSCULAR | Status: DC | PRN
  Administered 2022-08-30 (×2): 1 mg via INTRAVENOUS

## 2022-08-30 MED ORDER — SUGAMMADEX SODIUM 200 MG/2ML IV SOLN
INTRAVENOUS | Status: DC | PRN
  Administered 2022-08-30: 200 mg via INTRAVENOUS

## 2022-08-30 MED ORDER — OXYCODONE HCL 5 MG PO TABS: 5.0000 mg | ORAL_TABLET | Freq: Once | ORAL | Status: DC | PRN

## 2022-08-30 MED ORDER — SODIUM CHLORIDE 0.9% FLUSH
3.0000 mL | INTRAVENOUS | Status: DC | PRN
  Administered 2022-09-19 – 2022-10-07 (×2): 3 mL via INTRAVENOUS

## 2022-08-30 MED ORDER — LACTATED RINGERS IV SOLN: INTRAVENOUS | Status: DC

## 2022-08-30 MED ORDER — LIDOCAINE-EPINEPHRINE 1 %-1:100000 IJ SOLN
INTRAMUSCULAR | Status: DC | PRN
  Administered 2022-08-30: 20 mL

## 2022-08-30 MED ORDER — OXYCODONE HCL 5 MG/5ML PO SOLN: 5.0000 mg | Freq: Once | ORAL | Status: DC | PRN

## 2022-08-30 MED ORDER — SODIUM CHLORIDE 0.9 % IV SOLN
250.0000 mL | INTRAVENOUS | Status: DC
  Administered 2022-09-25: 250 mL via INTRAVENOUS

## 2022-08-30 MED ORDER — LIDOCAINE-EPINEPHRINE 1 %-1:100000 IJ SOLN
INTRAMUSCULAR | Status: AC
  Filled 2022-08-30: qty 1

## 2022-08-30 MED ORDER — THROMBIN 5000 UNITS EX SOLR
CUTANEOUS | Status: AC
  Filled 2022-08-30: qty 5000

## 2022-08-30 MED ORDER — FENTANYL CITRATE (PF) 250 MCG/5ML IJ SOLN
INTRAMUSCULAR | Status: DC | PRN
  Administered 2022-08-30 (×7): 50 ug via INTRAVENOUS

## 2022-08-30 MED ORDER — FENTANYL CITRATE (PF) 250 MCG/5ML IJ SOLN
INTRAMUSCULAR | Status: AC
  Filled 2022-08-30: qty 5

## 2022-08-30 MED ORDER — KETAMINE HCL 10 MG/ML IJ SOLN
INTRAMUSCULAR | Status: DC | PRN
  Administered 2022-08-30 (×2): 20 mg via INTRAVENOUS
  Administered 2022-08-30: 10 mg via INTRAVENOUS

## 2022-08-30 MED ORDER — SODIUM CHLORIDE 0.9% FLUSH
3.0000 mL | Freq: Two times a day (BID) | INTRAVENOUS | Status: DC
  Administered 2022-08-31 – 2022-10-23 (×87): 3 mL via INTRAVENOUS

## 2022-08-30 MED ORDER — CEFAZOLIN SODIUM-DEXTROSE 2-4 GM/100ML-% IV SOLN
INTRAVENOUS | Status: AC
  Filled 2022-08-30: qty 100

## 2022-08-30 MED ORDER — PHENYLEPHRINE HCL-NACL 20-0.9 MG/250ML-% IV SOLN
INTRAVENOUS | Status: DC | PRN
  Administered 2022-08-30: 40 ug/min via INTRAVENOUS

## 2022-08-30 MED ORDER — ROCURONIUM BROMIDE 10 MG/ML (PF) SYRINGE
PREFILLED_SYRINGE | INTRAVENOUS | Status: DC | PRN
  Administered 2022-08-30: 20 mg via INTRAVENOUS
  Administered 2022-08-30: 100 mg via INTRAVENOUS
  Administered 2022-08-30 (×2): 20 mg via INTRAVENOUS

## 2022-08-30 MED ORDER — VANCOMYCIN HCL 1000 MG IV SOLR
INTRAVENOUS | Status: DC | PRN
  Administered 2022-08-30: 1000 mg via TOPICAL

## 2022-08-30 MED ORDER — BUPIVACAINE HCL (PF) 0.5 % IJ SOLN
INTRAMUSCULAR | Status: AC
  Filled 2022-08-30: qty 30

## 2022-08-30 MED ORDER — CHLORHEXIDINE GLUCONATE 0.12 % MT SOLN
OROMUCOSAL | Status: AC
  Administered 2022-08-30: 15 mL
  Filled 2022-08-30: qty 15

## 2022-08-30 MED ORDER — PHENYLEPHRINE 80 MCG/ML (10ML) SYRINGE FOR IV PUSH (FOR BLOOD PRESSURE SUPPORT)
PREFILLED_SYRINGE | INTRAVENOUS | Status: AC
  Filled 2022-08-30: qty 10

## 2022-08-30 MED ORDER — PROPOFOL 10 MG/ML IV BOLUS
INTRAVENOUS | Status: DC | PRN
  Administered 2022-08-30: 30 mg via INTRAVENOUS
  Administered 2022-08-30: 100 mg via INTRAVENOUS
  Administered 2022-08-30: 20 mg via INTRAVENOUS

## 2022-08-30 MED ORDER — HYDROMORPHONE HCL 1 MG/ML IJ SOLN
INTRAMUSCULAR | Status: AC
  Filled 2022-08-30: qty 1

## 2022-08-30 MED ORDER — KETAMINE HCL 50 MG/5ML IJ SOSY
PREFILLED_SYRINGE | INTRAMUSCULAR | Status: AC
  Filled 2022-08-30: qty 5

## 2022-08-30 MED ORDER — 0.9 % SODIUM CHLORIDE (POUR BTL) OPTIME
TOPICAL | Status: DC | PRN
  Administered 2022-08-30: 1000 mL

## 2022-08-30 MED ORDER — ACETAMINOPHEN 500 MG PO TABS
ORAL_TABLET | ORAL | Status: AC
  Filled 2022-08-30: qty 2

## 2022-08-30 MED ORDER — ROCURONIUM BROMIDE 10 MG/ML (PF) SYRINGE
PREFILLED_SYRINGE | INTRAVENOUS | Status: AC
  Filled 2022-08-30: qty 10

## 2022-08-30 MED ORDER — AMISULPRIDE (ANTIEMETIC) 5 MG/2ML IV SOLN
INTRAVENOUS | Status: AC
  Filled 2022-08-30: qty 2

## 2022-08-30 MED ORDER — VANCOMYCIN HCL 1000 MG IV SOLR
INTRAVENOUS | Status: AC
  Filled 2022-08-30: qty 20

## 2022-08-30 MED ORDER — FENTANYL CITRATE (PF) 100 MCG/2ML IJ SOLN
INTRAMUSCULAR | Status: AC
  Filled 2022-08-30: qty 2

## 2022-08-30 MED ORDER — PROPOFOL 10 MG/ML IV BOLUS
INTRAVENOUS | Status: AC
  Filled 2022-08-30: qty 20

## 2022-08-30 MED ORDER — GLYCOPYRROLATE PF 0.2 MG/ML IJ SOSY
PREFILLED_SYRINGE | INTRAMUSCULAR | Status: DC | PRN
  Administered 2022-08-30: .2 mg via INTRAVENOUS

## 2022-08-30 MED ORDER — THROMBIN 5000 UNITS EX SOLR
OROMUCOSAL | Status: DC | PRN
  Administered 2022-08-30: 5 mL via TOPICAL

## 2022-08-30 MED ORDER — CHLORHEXIDINE GLUCONATE 0.12 % MT SOLN: 15.0000 mL | Freq: Once | OROMUCOSAL | Status: DC

## 2022-08-30 MED ORDER — ONDANSETRON HCL 4 MG/2ML IJ SOLN: 4.0000 mg | Freq: Once | INTRAMUSCULAR | Status: DC | PRN

## 2022-08-30 MED ORDER — BUPIVACAINE LIPOSOME 1.3 % IJ SUSP
INTRAMUSCULAR | Status: DC | PRN
  Administered 2022-08-30: 20 mL

## 2022-08-30 MED ORDER — AMISULPRIDE (ANTIEMETIC) 5 MG/2ML IV SOLN
10.0000 mg | Freq: Once | INTRAVENOUS | Status: AC
  Administered 2022-08-30: 10 mg via INTRAVENOUS

## 2022-08-30 MED ORDER — ONDANSETRON HCL 4 MG/2ML IJ SOLN
INTRAMUSCULAR | Status: DC | PRN
  Administered 2022-08-30: 4 mg via INTRAVENOUS

## 2022-08-30 MED ORDER — BUPIVACAINE LIPOSOME 1.3 % IJ SUSP
INTRAMUSCULAR | Status: AC
  Filled 2022-08-30: qty 20

## 2022-08-30 MED ORDER — VANCOMYCIN HCL 750 MG/150ML IV SOLN
750.0000 mg | Freq: Two times a day (BID) | INTRAVENOUS | Status: DC
  Administered 2022-08-30 – 2022-08-31 (×2): 750 mg via INTRAVENOUS
  Filled 2022-08-30 (×2): qty 150

## 2022-08-30 MED ORDER — MIDAZOLAM HCL 2 MG/2ML IJ SOLN
INTRAMUSCULAR | Status: AC
  Filled 2022-08-30: qty 2

## 2022-08-30 MED ORDER — HYDROMORPHONE HCL 1 MG/ML IJ SOLN
0.2500 mg | INTRAMUSCULAR | Status: DC | PRN
  Administered 2022-08-30: 0.25 mg via INTRAVENOUS
  Administered 2022-08-30: 0.5 mg via INTRAVENOUS
  Administered 2022-08-30 (×2): 0.25 mg via INTRAVENOUS
  Administered 2022-08-30: 0.5 mg via INTRAVENOUS
  Administered 2022-08-30: 0.25 mg via INTRAVENOUS

## 2022-08-30 MED ORDER — LABETALOL HCL 5 MG/ML IV SOLN: 10.0000 mg | INTRAVENOUS | Status: DC | PRN

## 2022-08-30 MED ORDER — EPHEDRINE 5 MG/ML INJ
INTRAVENOUS | Status: AC
  Filled 2022-08-30: qty 5

## 2022-08-30 SURGICAL SUPPLY — 97 items
ADH SKN CLS APL DERMABOND .7 (GAUZE/BANDAGES/DRESSINGS) ×4
BAG COUNTER SPONGE SURGICOUNT (BAG) ×3 IMPLANT
BAG SPNG CNTER NS LX DISP (BAG) ×2
BLADE CLIPPER SURG (BLADE) IMPLANT
BUR 14 MATCH 3 (BUR) IMPLANT
BUR MATCHSTICK NEURO 3.0 LAGG (BURR) ×3 IMPLANT
BUR MR8 14 BALL 5 (BUR) IMPLANT
BUR PRECISION FLUTE 5.0 (BURR) ×3 IMPLANT
BURR 14 MATCH 3 (BUR) ×2
BURR MR8 14 BALL 5 (BUR)
CANISTER SUCT 3000ML PPV (MISCELLANEOUS) ×3 IMPLANT
CNTNR URN SCR LID CUP LEK RST (MISCELLANEOUS) ×3 IMPLANT
CONT SPEC 4OZ STRL OR WHT (MISCELLANEOUS) ×2
COVER BACK TABLE 60X90IN (DRAPES) ×3 IMPLANT
COVERAGE SUPPORT O-ARM STEALTH (MISCELLANEOUS) ×2 IMPLANT
DERMABOND ADVANCED .7 DNX12 (GAUZE/BANDAGES/DRESSINGS) ×6 IMPLANT
DRAIN CHANNEL 10M FLAT 3/4 FLT (DRAIN) IMPLANT
DRAIN JACKSON PRATT 10MM FLAT (MISCELLANEOUS) IMPLANT
DRAIN RELI 100 BL SUC LF ST (DRAIN) ×2
DRAPE 3/4 80X56 (DRAPES) ×3 IMPLANT
DRAPE C-ARM 42X72 X-RAY (DRAPES) ×3 IMPLANT
DRAPE C-ARMOR (DRAPES) ×3 IMPLANT
DRAPE LAPAROTOMY 100X72X124 (DRAPES) ×3 IMPLANT
DRAPE MICROSCOPE SLANT 54X150 (MISCELLANEOUS) IMPLANT
DRAPE SHEET LG 3/4 BI-LAMINATE (DRAPES) ×12 IMPLANT
DRSG OPSITE 4X5.5 SM (GAUZE/BANDAGES/DRESSINGS) IMPLANT
DRSG OPSITE POSTOP 4X12 (GAUZE/BANDAGES/DRESSINGS) IMPLANT
DRSG OPSITE POSTOP 4X6 (GAUZE/BANDAGES/DRESSINGS) IMPLANT
DRSG TELFA 3X8 NADH STRL (GAUZE/BANDAGES/DRESSINGS) IMPLANT
DURAPREP 26ML APPLICATOR (WOUND CARE) ×3 IMPLANT
ELECT BLADE INSULATED 4IN (ELECTROSURGICAL) ×2
ELECT BLADE INSULATED 6.5IN (ELECTROSURGICAL) ×2
ELECT REM PT RETURN 9FT ADLT (ELECTROSURGICAL) ×2
ELECTRODE BLADE INSULATED 4IN (ELECTROSURGICAL) IMPLANT
ELECTRODE BLDE INSULATED 6.5IN (ELECTROSURGICAL) ×3 IMPLANT
ELECTRODE REM PT RTRN 9FT ADLT (ELECTROSURGICAL) ×3 IMPLANT
EVACUATOR SILICONE 100CC (DRAIN) IMPLANT
FEE COVERAGE SUPPORT O-ARM (MISCELLANEOUS) ×3 IMPLANT
GAUZE 4X4 16PLY ~~LOC~~+RFID DBL (SPONGE) IMPLANT
GAUZE SPONGE 4X4 12PLY STRL (GAUZE/BANDAGES/DRESSINGS) IMPLANT
GLOVE BIO SURGEON STRL SZ7 (GLOVE) ×12 IMPLANT
GLOVE BIOGEL PI IND STRL 7.0 (GLOVE) ×6 IMPLANT
GLOVE BIOGEL PI IND STRL 7.5 (GLOVE) ×9 IMPLANT
GLOVE ECLIPSE 7.5 STRL STRAW (GLOVE) ×6 IMPLANT
GLOVE EXAM NITRILE XL STR (GLOVE) IMPLANT
GOWN STRL REUS W/ TWL LRG LVL3 (GOWN DISPOSABLE) ×3 IMPLANT
GOWN STRL REUS W/ TWL XL LVL3 (GOWN DISPOSABLE) ×6 IMPLANT
GOWN STRL REUS W/TWL 2XL LVL3 (GOWN DISPOSABLE) IMPLANT
GOWN STRL REUS W/TWL LRG LVL3 (GOWN DISPOSABLE) ×2
GOWN STRL REUS W/TWL XL LVL3 (GOWN DISPOSABLE) ×4
GRAFT BN 10X1XDBM MAGNIFUSE (Bone Implant) IMPLANT
GRAFT BONE MAGNIFUSE 1X10CM (Bone Implant) ×2 IMPLANT
HEMOSTAT POWDER KIT SURGIFOAM (HEMOSTASIS) ×3 IMPLANT
KIT BASIN OR (CUSTOM PROCEDURE TRAY) ×3 IMPLANT
KIT TURNOVER KIT B (KITS) ×3 IMPLANT
MARKER SPHERE PSV REFLC NDI (MISCELLANEOUS) ×15 IMPLANT
NDL HYPO 18GX1.5 BLUNT FILL (NEEDLE) IMPLANT
NDL HYPO 21X1.5 SAFETY (NEEDLE) ×3 IMPLANT
NDL SPNL 18GX3.5 QUINCKE PK (NEEDLE) IMPLANT
NEEDLE HYPO 18GX1.5 BLUNT FILL (NEEDLE) IMPLANT
NEEDLE HYPO 21X1.5 SAFETY (NEEDLE) ×2 IMPLANT
NEEDLE HYPO 22GX1.5 SAFETY (NEEDLE) ×3 IMPLANT
NEEDLE SPNL 18GX3.5 QUINCKE PK (NEEDLE) IMPLANT
NS IRRIG 1000ML POUR BTL (IV SOLUTION) ×3 IMPLANT
PACK LAMINECTOMY NEURO (CUSTOM PROCEDURE TRAY) ×3 IMPLANT
PAD ARMBOARD 7.5X6 YLW CONV (MISCELLANEOUS) ×9 IMPLANT
ROD PED SOLERA STRT 5.5X500 (Rod) IMPLANT
SCREW FENS MAS 5.5X30 (Screw) IMPLANT
SCREW FENS SOLERA 5.5X35 (Screw) IMPLANT
SCREW FENS SOLERA 5.5X40 (Screw) IMPLANT
SCREW FNS MA 5.5X50 (Screw) IMPLANT
SCREW SET SOLERA (Screw) ×22 IMPLANT
SCREW SET SOLERA TI5.5 (Screw) IMPLANT
SCREW SOLERA 45X5.5XMA (Screw) IMPLANT
SCREW SOLERA 45X6.5XMA (Screw) IMPLANT
SCREW SOLERA 5.5X45MM (Screw) ×4 IMPLANT
SCREW SOLERA 50X6.5XMA (Screw) IMPLANT
SCREW SOLERA 6.5X45MM (Screw) ×2 IMPLANT
SCREW SOLERA 6.5X50MM (Screw) ×2 IMPLANT
SEALER BIPOLAR AQUA 6.0 (INSTRUMENTS) IMPLANT
SOL ELECTROSURG ANTI STICK (MISCELLANEOUS)
SOLUTION ELECTROSURG ANTI STCK (MISCELLANEOUS) ×6 IMPLANT
SPIKE FLUID TRANSFER (MISCELLANEOUS) ×3 IMPLANT
SPONGE SURGIFOAM ABS GEL 100 (HEMOSTASIS) IMPLANT
SPONGE T-LAP 4X18 ~~LOC~~+RFID (SPONGE) IMPLANT
STAPLER VISISTAT 35W (STAPLE) ×3 IMPLANT
SUT MNCRL AB 4-0 PS2 18 (SUTURE) ×3 IMPLANT
SUT VIC AB 0 CT1 18XCR BRD8 (SUTURE) ×3 IMPLANT
SUT VIC AB 0 CT1 8-18 (SUTURE) ×8
SUT VIC AB 2-0 CP2 18 (SUTURE) ×3 IMPLANT
SWAB COLLECTION DEVICE MRSA (MISCELLANEOUS) IMPLANT
SWAB CULTURE ESWAB REG 1ML (MISCELLANEOUS) IMPLANT
SYR 30ML LL (SYRINGE) ×3 IMPLANT
TOWEL GREEN STERILE (TOWEL DISPOSABLE) ×3 IMPLANT
TOWEL GREEN STERILE FF (TOWEL DISPOSABLE) ×3 IMPLANT
TRAY FOLEY MTR SLVR 16FR STAT (SET/KITS/TRAYS/PACK) ×3 IMPLANT
WATER STERILE IRR 1000ML POUR (IV SOLUTION) ×3 IMPLANT

## 2022-08-30 NOTE — Progress Notes (Signed)
PROGRESS NOTE  David Oharrow ZOX:[id number] DOB: 04/05/1960   PCP: [name], MD  Patient is from: Home  DOA: 08/20/2022 LOS: 7  Chief complaints Chief Complaint  Patient presents with   Hypoglycemia     Brief Narrative / Interim history: 62 year old M with PMH of disseminated MRSA bacteremia with T7-T9 discitis, right foot osteomyelitis s/p right fifth ray amputation on 4/30, PAD  s/p angioplasty by Dr. Chestine Spore on 4/25 persistent A-fib on Eliquis, DM-2, seizure disorder, HTN, HLD and chronic pain return to ED with hypoglycemia to 38.  Patient was hospitalized for disseminated MRSA bacteremia from 4/9-5/17, and discharged on p.o. doxycycline.  Patient was hypoglycemic to 38 when EMS arrived.  His hypoglycemia persisted despite intervention in ED and leading to his rehospitalization.  He also have significant back pain.  MRI thoracic spine concerning for progressive discitis/osteomyelitis at T7-T9, extensive phlegmonous change in the paravertebral soft tissues,  partial collapse of T8 vertebral body with 40% vertebral body height loss and approximately 6 mm bony retropulsion, moderate canal stenosis and mild cord edema.  Neurosurgery and ID consulted.  Patient was started on IV vancomycin.  Neurosurgery planning surgical intervention today, 5/28.  Palliative medicine helping with pain management.  Therapy recommended CIR   Subjective: Seen and examined earlier this morning.  No major events overnight of this morning.  Pain better today.  Looking forward to surgery "to be done with pain".   Objective: Vitals:   08/29/22 2105 08/30/22 0033 08/30/22 0508 08/30/22 0830  BP: (!) 148/79 (!) 140/75 (!) 144/90 (!) 162/97  Pulse: 68 (!) 59 71 76  Resp: 17 18 19 16   Temp: 98 F (36.7 C) 97.8 F (36.6 C) 97.8 F (36.6 C) 98.3 F (36.8 C)  TempSrc: Oral Oral Oral Oral  SpO2: 100% 99% 100% 100%  Weight:      Height:        Examination:  GENERAL: No apparent distress.  Nontoxic. HEENT:  MMM.  Vision and hearing grossly intact.  NECK: Supple.  No apparent JVD.  RESP:  No IWOB.  Fair aeration bilaterally. CVS:  RRR. Heart sounds normal.  ABD/GI/GU: BS+. Abd soft, NTND.  MSK/EXT:   No apparent deformity. Moves extremities. No edema.  SKIN: no apparent skin lesion or wound NEURO: Awake and alert. Oriented appropriately.  No apparent focal neuro deficit. PSYCH: Calm. Normal affect.   Procedures:  None  Microbiology summarized: MRSA PCR screen nonreactive.  Assessment and plan: Principal Problem:   Hypoglycemia Active Problems:   Essential hypertension   Insulin dependent type 2 diabetes mellitus (HCC)   Persistent atrial fibrillation (HCC)   Pure hypercholesterolemia   Cellulitis in diabetic foot (HCC)   Diskitis  Uncontrolled IDDM-2 with hypoglycemia and hyperglycemia: Hypoglycemia resolved.  A1c 9.1% on 4/10. Recent Labs  Lab 08/29/22 0608 08/29/22 1147 08/29/22 1713 08/30/22 0610 08/30/22 0840  GLUCAP 179* 176* 190* 168* 142*  -Continue current insulin regimen   Disseminated MRSA bacteremia with T7-T9 discitis/osteomyelitis: MRI concerning for progressive discitis/osteomyelitis.  See details above.  Patient was hospitalized for this from 4/9-5/17 and discharged on doxycycline.  Previously treated with daptomycin followed by vancomycin and oritavancin. -Neurosurgery following-plan for surgical intervention today, 5/28. -ID following-on IV vancomycin -Pain control per palliative medicine. -IV Dilaudid 1 mg every 2 hours as needed severe pain -MS Contin 30 mg every 12 hours -Lyrica 50 mg twice daily -Oxycodone to 15 mg every 4 hours as needed moderate pain on 5/23 -Robaxin to 750 mg 4 times daily on  5/23 -Tylenol 1 g 3 times daily ATC -Lidoderm patch and K-pad -Palliative considering ketamine if logistic allows -Bowel regimen and Narcan as needed. -Hold Eliquis.  IV heparin pending surgery.  Right foot osteomyelitis s/p right fifth ray amputation on  4/30.  Surgical wound appears DCI.  Stitches in place. -Stitches removed on 5/22 after discussion with orthopedic surgery PA -Antibiotics as above -Continue dressing  Chronic HFrEF with recovered EF: TTE in 2020 with LVEF of 35 to 40% (60 to 65% in 2024).  Appears euvolemic on exam. Cr slightly higher than baseline. -Monitor respiratory and fluid status while on IV fluid -Continue holding Lasix and Entresto to allow further renal recovery.  AKI: Likely due to diuretics, Entresto and poor p.o. intake.  Resolved. Recent Labs    08/15/22 0400 08/17/22 0733 08/20/22 1614 08/21/22 0256 08/22/22 0313 08/23/22 0326 08/25/22 0026 08/25/22 2355 08/27/22 0338 08/29/22 0415  BUN 15 15 15 17 15 16  21* 18 11 7   CREATININE 1.52* 1.33* 1.20 1.24 1.20 1.26* 1.49* 1.31* 1.13 0.97  -Continue holding Lasix and Entresto.  Acute on chronic pain -Pain control as above.   Essential hypertension: Normotensive. -Continue carvedilol 12.5 mg twice daily -Continue holding Lasix and Entresto. -IV labetalol as needed. -Pain control   Persistent atrial fibrillation: Rate controlled.  CHA2DS2-VASc of 4. -Continue digoxin, Coreg  -Hold IV heparin for surgery   Pure hypercholesterolemia -Continue atorvastatin 80 mg p.o. daily.   Small/moderate pleural effusion: Previously treated for pneumonia with parapneumonic effusion.  -Antibiotics as above.  Hypomagnesemia/hypokalemia -Monitor replenish as appropriate   Physical deconditioning -PT/OT-recommended CIR  Constipation: Resolved. -Bowel regimen  Body mass index is 24.91 kg/m.           DVT prophylaxis:  SCD's Start: 08/29/22 1849 SCD's Start: 08/29/22 1849On full dose anticoagulation  Code Status: DNR/DNI Family Communication: None at bedside Level of care: Med-Surg Status is: Inpatient Remains inpatient appropriate because: Discitis/osteomyelitis, acute on chronic back pain   Final disposition: CIR? Consultants:  Infectious  disease Neurosurgery Palliative medicine  35 minutes with more than 50% spent in reviewing records, counseling patient/family and coordinating care.   Sch Meds:  Scheduled Meds:  acetaminophen  1,000 mg Oral TID   atorvastatin  80 mg Oral Daily   busPIRone  15 mg Oral BID   carvedilol  12.5 mg Oral BID   digoxin  125 mcg Oral Daily   famotidine  20 mg Oral BID   feeding supplement  237 mL Oral BID BM   fluticasone  1 spray Each Nare Daily   insulin aspart  0-5 Units Subcutaneous QHS   insulin aspart  0-9 Units Subcutaneous TID WC   lidocaine  1 patch Transdermal Q24H   methocarbamol  750 mg Oral QID   morphine  30 mg Oral Q12H   pantoprazole  40 mg Oral Daily   polyethylene glycol  17 g Oral BID   pregabalin  50 mg Oral TID   senna-docusate  1 tablet Oral BID   Continuous Infusions:  sodium chloride 75 mL/hr at 08/30/22 0404    ceFAZolin (ANCEF) IV     heparin Stopped (08/30/22 0925)   vancomycin 1,000 mg (08/30/22 1012)   PRN Meds:.HYDROmorphone (DILAUDID) injection, ondansetron **OR** ondansetron (ZOFRAN) IV, mouth rinse, oxyCODONE, oxyCODONE  Antimicrobials: Anti-infectives (From admission, onward)    Start     Dose/Rate Route Frequency Ordered Stop   08/30/22 0600  ceFAZolin (ANCEF) IVPB 2g/100 mL premix        2 g  200 mL/hr over 30 Minutes Intravenous On call to O.R. 08/29/22 1848 08/31/22 0559   08/27/22 1041  vancomycin (VANCOCIN) IVPB 1000 mg/200 mL premix        1,000 mg 200 mL/hr over 60 Minutes Intravenous Every 12 hours 08/27/22 0802     08/26/22 2300  vancomycin (VANCOREADY) IVPB 1250 mg/250 mL  Status:  Discontinued        1,250 mg 166.7 mL/hr over 90 Minutes Intravenous Every 24 hours 08/26/22 1014 08/27/22 0802   08/23/22 2200  vancomycin (VANCOREADY) IVPB 750 mg/150 mL  Status:  Discontinued        750 mg 150 mL/hr over 60 Minutes Intravenous Every 12 hours 08/23/22 0938 08/26/22 1014   08/23/22 1030  vancomycin (VANCOREADY) IVPB 1500 mg/300 mL         1,500 mg 150 mL/hr over 120 Minutes Intravenous  Once 08/23/22 0938 08/23/22 1312   08/20/22 2200  doxycycline (VIBRA-TABS) tablet 100 mg  Status:  Discontinued        100 mg Oral 2 times daily 08/20/22 1737 08/23/22 0927        I have personally reviewed the following labs and images: CBC: Recent Labs  Lab 08/25/22 2355 08/27/22 0338 08/28/22 0459 08/29/22 0415 08/30/22 0312  WBC 10.2 7.4 7.4 7.2 6.7  HGB 10.5* 9.7* 10.0* 9.4* 10.2*  HCT 31.5* 30.2* 30.6* 28.9* 31.1*  MCV 87.7 86.8 85.2 86.0 88.1  PLT 305 299 273 262 251   BMP &GFR Recent Labs  Lab 08/25/22 0026 08/25/22 2355 08/27/22 0338 08/29/22 0415  NA 130* 130* 131* 133*  K 4.0 3.9 3.5 3.8  CL 100 101 102 105  CO2 19* 19* 20* 21*  GLUCOSE 128* 158* 163* 182*  BUN 21* 18 11 7   CREATININE 1.49* 1.31* 1.13 0.97  CALCIUM 9.0 9.1 9.2 8.6*  MG 1.8 1.7 1.6*  --   PHOS 4.1 3.3 3.2  --    Estimated Creatinine Clearance: 88.9 mL/min (by C-G formula based on SCr of 0.97 mg/dL). Liver & Pancreas: Recent Labs  Lab 08/25/22 0026 08/25/22 2355 08/27/22 0338  ALBUMIN 2.2* 2.3* 2.3*   No results for input(s): "LIPASE", "AMYLASE" in the last 168 hours. No results for input(s): "AMMONIA" in the last 168 hours. Diabetic: No results for input(s): "HGBA1C" in the last 72 hours. Recent Labs  Lab 08/29/22 0608 08/29/22 1147 08/29/22 1713 08/30/22 0610 08/30/22 0840  GLUCAP 179* 176* 190* 168* 142*   Cardiac Enzymes: No results for input(s): "CKTOTAL", "CKMB", "CKMBINDEX", "TROPONINI" in the last 168 hours. No results for input(s): "PROBNP" in the last 8760 hours. Coagulation Profile: Recent Labs  Lab 08/29/22 2011  INR 1.2   Thyroid Function Tests: No results for input(s): "TSH", "T4TOTAL", "FREET4", "T3FREE", "THYROIDAB" in the last 72 hours. Lipid Profile: No results for input(s): "CHOL", "HDL", "LDLCALC", "TRIG", "CHOLHDL", "LDLDIRECT" in the last 72 hours. Anemia Panel: No results for input(s):  "VITAMINB12", "FOLATE", "FERRITIN", "TIBC", "IRON", "RETICCTPCT" in the last 72 hours. Urine analysis:    Component Value Date/Time   COLORURINE STRAW (A) 08/20/2022 1545   APPEARANCEUR CLEAR 08/20/2022 1545   LABSPEC 1.005 08/20/2022 1545   PHURINE 6.0 08/20/2022 1545   GLUCOSEU NEGATIVE 08/20/2022 1545   HGBUR NEGATIVE 08/20/2022 1545   BILIRUBINUR NEGATIVE 08/20/2022 1545   BILIRUBINUR negative 09/25/2019 1555   KETONESUR NEGATIVE 08/20/2022 1545   PROTEINUR 30 (A) 08/20/2022 1545   UROBILINOGEN 1.0 09/25/2019 1555   NITRITE NEGATIVE 08/20/2022 1545   LEUKOCYTESUR NEGATIVE 08/20/2022  1545   Sepsis Labs: Invalid input(s): "PROCALCITONIN", "LACTICIDVEN"  Microbiology: Recent Results (from the past 240 hour(s))  MRSA Next Gen by PCR, Nasal     Status: Abnormal   Collection Time: 08/20/22  6:16 PM   Specimen: Nasal Mucosa; Nasal Swab  Result Value Ref Range Status   MRSA by PCR Next Gen DETECTED (A) NOT DETECTED Final    Comment: RESULT CALLED TO, READ BACK BY AND VERIFIED WITH: HARRIS,D AT 2208 ON 08/20/22 BY LUZOLOP (NOTE) The GeneXpert MRSA Assay (FDA approved for NASAL specimens only), is one component of a comprehensive MRSA colonization surveillance program. It is not intended to diagnose MRSA infection nor to guide or monitor treatment for MRSA infections. Test performance is not FDA approved in patients less than 27 years old. Performed at [facility], [address] [place], [place] [address]     Radiology Studies: No results found.    Donnamarie Shankles T. Deidre Carino [name]  If 7PM-7AM, please contact night-coverage [email or web address] 08/30/2022, 10:30 AM

## 2022-08-30 NOTE — Progress Notes (Signed)
ANTICOAGULATION CONSULT NOTE - Follow Up Consult  Pharmacy Consult for Heparin (Apixaban on HOLD) Indication: atrial fibrillation  No Known Allergies  Patient Measurements: Height: 6' (182.9 cm) Weight: 83.3 kg (183 lb 10.3 oz) IBW/kg (Calculated) : 77.6 Heparin Dosing Weight: 77.8 kg  Vital Signs: Temp: 98.3 F (36.8 C) (05/28 0830) Temp Source: Oral (05/28 0830) BP: 162/97 (05/28 0830) Pulse Rate: 76 (05/28 0830)  Labs: Recent Labs    08/27/22 1523 08/27/22 2330 08/27/22 2330 08/28/22 0459 08/29/22 0415 08/29/22 1307 08/29/22 2011 08/30/22 0312  HGB  --   --    < > 10.0* 9.4*  --   --  10.2*  HCT  --   --   --  30.6* 28.9*  --   --  31.1*  PLT  --   --   --  273 262  --   --  251  APTT 56* 82*  --  86*  --   --   --   --   LABPROT  --   --   --   --   --   --  15.0  --   INR  --   --   --   --   --   --  1.2  --   HEPARINUNFRC  --  0.37   < > 0.33 0.22* 0.27* 0.34 0.45  CREATININE  --   --   --   --  0.97  --   --   --    < > = values in this interval not displayed.     Estimated Creatinine Clearance: 88.9 mL/min (by C-G formula based on SCr of 0.97 mg/dL).   Medications:  Infusions:   sodium chloride 75 mL/hr at 08/30/22 0404   ceFAZolin      ceFAZolin (ANCEF) IV     heparin Stopped (08/30/22 0925)   [MAR Hold] vancomycin      Assessment: [name] on apixaban PTA for afib.  Pharmacy has been consulted to dose heparin while apixaban on hold for possible neurosurgery.  Last apixaban dose 5/20 PM.   Heparin level was therapeutic at 0.45 on 1500 units/hour. No bleeding noted, Hgb 9-10s and platelets are within normal limits and stable.   Heparin stopped at 09:25 for surgery.  Goal of Therapy:  Heparin level 0.3-0.7 units/ml Monitor platelets by anticoagulation protocol: Yes   Plan:  Follow-up plan to resume heparin drip s/p surgery  Thank you for involving pharmacy in this patient's care.  David [name], PharmD, BCPS Clinical  Pharmacist Clinical phone for 08/30/2022 is [phone] 08/30/2022 12:25 PM

## 2022-08-30 NOTE — Transfer of Care (Signed)
Immediate Anesthesia Transfer of Care Note  Patient: David [name]  Procedure(s) Performed: THORACIC FIVE-THORACIC ELEVEN FUSION, THORACIC EIGHT TRANSPEDICULAR DECOMPRESSION AND PARTIAL CORPECTOMY with O-Arm (Back) Application of O-Arm  Patient Location: PACU  Anesthesia Type:General  Level of Consciousness: awake and alert   Airway & Oxygen Therapy: Patient Spontanous Breathing  Post-op Assessment: Report given to RN and Post -op Vital signs reviewed and stable  Post vital signs: Reviewed and stable  Last Vitals:  Vitals Value Taken Time  BP 111/76 08/30/22 1801  Temp 99   Pulse 101 08/30/22 1812  Resp 19 08/30/22 1812  SpO2 93 % 08/30/22 1812  Vitals shown include unvalidated device data.  Last Pain:  Vitals:   08/30/22 1225  TempSrc: Oral  PainSc: 8       Patients Stated Pain Goal: 0 (08/30/22 1156)  Complications: No notable events documented.

## 2022-08-30 NOTE — Op Note (Signed)
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 T5-6, T6-7, T7-8, T8-9, T9-10, T10-11 posterior/posterolateral arthrodesis 2. Transpedicular decompression with partial corpectomy, left T8 3. T8-9 laminectomy, bilateral facetectomy 4. Segmental instrumentation with percutaneously placed pedicle screw and rod construct at T5-6-7-11-10-08-11 5. Harvest of local autograft 6. Use of morselized allograft 7.  Intraoperative neuronavigation with Stealth 8. Intraoperative CT scan  SURGEON: Dr. Hoyt Koch, MD  ASSISTANT: David Ranks, PA. Please note, there were no qualified trainees available to assist with the procedure.  She assisted throughout the duration of the surgery.   ANESTHESIA: General Endotracheal  EBL: 400 ml  IMPLANTS:  Medtronic T5: 5.5 x 35 mm screws x 2 T6: 5.5 x 40 mm screws x 2 T7: 5.5 x 40/45 mm screws T9: 5.5 x 30 mm on right T10: 5.5 x 45/50 mm screws T11: 6.5 x 45/50 mm screws Magnifuse  SPECIMENS: T8 vertebral body  DRAINS: JP drain  COMPLICATIONS: none  CONDITION: Stable to PACU  HISTORY: David [name] is a 62 y.o. male with diabetes, atrial for David [name], and renal insufficiency who developed T8-9 discitis and osteomyelitis which was progressive.  He developed myelopathy with mostly sensory and proprioceptive difficulty in his left leg and inability to stand.  Imaging showed progressive kyphosis, retropulsed infected bone fragment.  I had a long discussion with the patient.  Risks, benefits, alternatives, and expected convalescence were discussed with the patient.  Risks discussed included but were not limited to bleeding, pain, infection, scar, pseudoarthrosis, fracture, kyphosis, CSF leak, neurologic deficit, paralysis, and death.  The patient wished to proceed with surgery and informed consent was obtained.  PROCEDURE IN DETAIL: After informed  consent was obtained and witnessed, the patient was brought to the operating room. After induction of general anesthesia, the patient was positioned on the operative table in the prone position on a Jackson table with all pressure points meticulously padded. The skin of the low back was then prepped and draped in the usual sterile fashion.  X-ray was used to plan an incision spanning T5-T11.  Preoperative antibiotics were administered and a timeout was performed.  Incision was made with a 10 blade and monopolar electrocautery was used to incise the fascia and dissected the paraspinous muscles off of T5-11 lamina in subperiosteal fashion.  Self-retaining retractor was placed.  Registration clamp was placed on the spinous process of T11 and intraoperative O-arm CT was performed.  This was registered with the Medtronic Stealth system to allow neuronavigation throughout the case.  Using the Stealth system, entry holes for pedicle screws were made bilaterally at T5, T6, T7, T10, T11, and on the right side at T9.  Screw holes were then tapped with navigated tap and ball ended feeler confirmed good cannulation.  Appropriately sized screws were placed with aid of navigation with good purchase.    Laminectomy of T8 and T9 was then performed, with small amount of normal-appearing laminar bone harvested for autograft.  Partial facetectomy was performed on the right and full facetectomy on the left.  The exiting nerve root was identified.  The inferior portion of the T8 pedicle was drilled out to allow access safely to the severely fractured vertebral body of T8.  The bone was [facility] and specimens were collected for culture.  Angled instruments were used to debulk the fractured infected bone and pushed the bone fragments off from the ventral dura.  Phlegmon was extremely adherent so a good epidural plane in the  ventral space could not be achieved safely.  However, good decompression of the spinal cord was confirmed with  palpation with angled ball ended instruments.  With decompression complete, the wound was irrigated thoroughly.  180 mm rods were bent and appropriate kyphosis and placed in the tulip heads and secured with screw caps and final tightened.  Final x-rays showed excellent position of instrumentation.  Decortication of the transverse processes and lamina was performed at T5, T6, T7, T8, T9, T10, and T11.  Autograft and allograft was placed in the lateral gutters bilaterally.  10 flat JP drain was placed in subfascial space and tunneled out the skin and secured with a stitch.  Wounds were irrigated thoroughly .  Exparel mixed with Marcaine was injected into the paraspinous muscles and subcutaneous tissues bilaterally.  The fascia was closed with 0 Vicryl stitches.  The dermal layer was closed with 2-0 Vicryl stitches in buried interrupted fashion.  The skin incisions were closed with 4-0 Monocryl subcuticular manner followed by Dermabond.  Sterile dressings was placed.  Patient was then flipped supine and extubated by the anesthesia service following commands and all 4 extremities.  All counts were correct at the end of surgery.  No complications were noted.

## 2022-08-30 NOTE — Progress Notes (Signed)
Pharmacy Antibiotic Note  David [name] is a 62 y.o. male admitted on 08/20/2022 with discitis/epidural abscess, hx prior disseminated MRSA infection. Pharmacy has been consulted for Vancomycin dosing.  Plan is for T5-T11 posterior fusion, T8 transpedicular debridement and decompression today.  Vancomycin peak 33, trough 22 - calculated AUC above goal at 669.6 (goal AUC 400-550). SCr bumped up to 1.49 on 5/23, now down to 0.97 (baseline ~1.1-1.2).  Plan: - Decrease vancomycin to 750 mg IV q12h (calculated AUC 502.1) - Will continue to follow renal function, culture results, LOT, and antibiotic de-escalation plans   Height: 6' (182.9 cm) Weight: 83.3 kg (183 lb 10.3 oz) IBW/kg (Calculated) : 77.6  Temp (24hrs), Avg:98 F (36.7 C), Min:97.8 F (36.6 C), Max:98.3 F (36.8 C)  Recent Labs  Lab 08/25/22 0026 08/25/22 2355 08/26/22 0644 08/27/22 0338 08/28/22 0459 08/29/22 0415 08/30/22 0110 08/30/22 0312 08/30/22 0958  WBC 9.9 10.2  --  7.4 7.4 7.2  --  6.7  --   CREATININE 1.49* 1.31*  --  1.13  --  0.97  --   --   --   VANCOTROUGH  --   --  20  --   --   --   --   --  22*  VANCOPEAK  --  36  --   --   --   --  33  --   --      Estimated Creatinine Clearance: 88.9 mL/min (by C-G formula based on SCr of 0.97 mg/dL).    No Known Allergies  Thank you for involving pharmacy in this patient's care.  Loura Back, PharmD, BCPS Clinical Pharmacist Clinical phone for 08/30/2022 is [phone] 08/30/2022 11:51 AM

## 2022-08-30 NOTE — Progress Notes (Signed)
This chaplain is present for F/U spiritual care in the setting of reflective listening and prayer before today's surgery.    The chaplain understands the Pt. faith is providing him hope for a successful surgery and regaining quality of life. The Pt. shares the support of his wife with the chaplain and recognizes her partnership in post surgery and recovery.  The Pt. is appreciative of the visit and requests F/U spiritual care.  Chaplain Stephanie Acre [phone]

## 2022-08-30 NOTE — Anesthesia Preprocedure Evaluation (Signed)
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 Chestine Spore on 4/25 persistent A-fib on Eliquis, DM-2, seizure disorder, HTN, HLD and chronic pain return to ED with hypoglycemia to 38.  Patient was hospitalized for disseminated MRSA bacteremia from 4/9-5/17, and discharged on p.o. doxycycline.  Patient was hypoglycemic to 38 when EMS arrived.  His hypoglycemia persisted despite intervention in ED and leading to his rehospitalization.  He also have significant back pain.  MRI thoracic spine concerning for progressive discitis/osteomyelitis at T7-T9, extensive phlegmonous change in the paravertebral soft tissues,  partial collapse of T8 vertebral body with 40% vertebral body height loss and approximately 6 mm bony retropulsion, moderate canal stenosis and mild cord edema.   Reviewed: Allergy & Precautions, H&P , NPO status , Patient's Chart, lab work & pertinent test results  Airway Mallampati: II  TM Distance: >3 FB Neck ROM: Full    Dental no notable dental hx.    Pulmonary neg pulmonary ROS, former smoker   Pulmonary exam normal breath sounds clear to auscultation       Cardiovascular hypertension, + Peripheral Vascular Disease  Normal cardiovascular exam+ dysrhythmias Atrial Fibrillation  Rhythm:Regular Rate:Normal     Neuro/Psych negative neurological ROS  negative psych ROS   GI/Hepatic negative GI ROS, Neg liver ROS,,,  Endo/Other  diabetes    Renal/GU ARFRenal disease  negative genitourinary   Musculoskeletal negative musculoskeletal ROS (+)    Abdominal   Peds negative pediatric ROS (+)  Hematology negative hematology ROS (+)   Anesthesia Other Findings   Reproductive/Obstetrics negative OB  ROS                             Anesthesia Physical Anesthesia Plan  ASA: 3  Anesthesia Plan: General   Post-op Pain Management: Ketamine IV*   Induction: Intravenous  PONV Risk Score and Plan: 2 and Ondansetron, Dexamethasone and Treatment may vary due to age or medical condition  Airway Management Planned: Oral ETT  Additional Equipment: Arterial line  Intra-op Plan:   Post-operative Plan: Extubation in OR  Informed Consent: I have reviewed the patients History and Physical, chart, labs and discussed the procedure including the risks, benefits and alternatives for the proposed anesthesia with the patient or authorized representative who has indicated his/her understanding and acceptance.     Dental advisory given  Plan Discussed with: CRNA and Surgeon  Anesthesia Plan Comments: (A line or clearsight)       Anesthesia Quick Evaluation

## 2022-08-30 NOTE — Significant Event (Signed)
Patient requesting that he sign his own consents. He is alert and oriented X 4. Patient verbalizes understanding of the reasons for the consents. Patient tells RN that his family is aware of his conditions and the plan today. He declined for RN to notify his family.

## 2022-08-30 NOTE — Progress Notes (Signed)
Notified by surgical nurse, Garen Lah at preop that patient and wife likes to rescind DNR and remain full code even after surgery. Code status updated to full code in Epic.

## 2022-08-30 NOTE — Anesthesia Procedure Notes (Signed)
Procedure Name: Intubation Date/Time: 08/30/2022 1:04 PM  Performed by: Brynda Peon, CRNAPre-anesthesia Checklist: Patient identified, Emergency Drugs available, Suction available, Patient being monitored and Timeout performed Patient Re-evaluated:Patient Re-evaluated prior to induction Oxygen Delivery Method: Circle system utilized Preoxygenation: Pre-oxygenation with 100% oxygen Induction Type: IV induction Laryngoscope Size: Miller and 3 Grade View: Grade I Tube type: Oral Tube size: 7.5 mm Number of attempts: 1 Airway Equipment and Method: Stylet Placement Confirmation: ETT inserted through vocal cords under direct vision, positive ETCO2 and breath sounds checked- equal and bilateral Secured at: 23 cm Tube secured with: Tape Dental Injury: Teeth and Oropharynx as per pre-operative assessment

## 2022-08-30 NOTE — Progress Notes (Signed)
Neurosurgery  Patient complaining of back pain, no new neurologic changes. Plan for surgical stabilization today with debridement.  With his medical issues he is at high risk of  complications, both upfront and delayed, from surgery, but nevertheless, without surgery, paraplegia and death appear almost certain.  Informed consent was obtained.

## 2022-08-30 NOTE — Progress Notes (Signed)
Pt arrived to short stay. States the he spoke with his wife and they both desire to patient be a full code. Lurena Nida RN witnessed conversation. Dr. Okey Dupre anesthesia also aware of patient wishes. DNR purple armband removed.  Spoke with attending hospitalist, Dr. Alanda Slim, also made aware.

## 2022-08-30 NOTE — Anesthesia Procedure Notes (Signed)
Arterial Line Insertion Start/End5/28/2024 12:50 PM, 08/30/2022 12:57 PM Performed by: Eilene Ghazi, MD, Luciel Brickman, Rachel Bo, CRNA, CRNA  Patient location: Pre-op. Preanesthetic checklist: patient identified, IV checked, site marked, risks and benefits discussed, surgical consent, monitors and equipment checked, pre-op evaluation, timeout performed and anesthesia consent Lidocaine 1% used for infiltration Left, radial was placed Catheter size: 20 G Hand hygiene performed , maximum sterile barriers used  and Seldinger technique used Allen's test indicative of satisfactory collateral circulation Attempts: 1 Procedure performed using ultrasound guided technique. Ultrasound Notes:anatomy identified, needle tip was noted to be adjacent to the nerve/plexus identified and no ultrasound evidence of intravascular and/or intraneural injection Following insertion, dressing applied and Biopatch. Post procedure assessment: normal  Patient tolerated the procedure well with no immediate complications.

## 2022-08-30 NOTE — Progress Notes (Signed)
   08/30/22 2117  Provider Notification  Provider Name/Title Dr Hoyt Koch  Date Provider Notified 08/30/22  Time Provider Notified 2117  Method of Notification Page  Notification Reason Other (Comment) (Pt Post op today still on heparin drip, needs clarification to continue med)  Provider response See new orders (NP Hildred Priest (on call))  Date of Provider Response 08/30/22  Time of Provider Response 2128

## 2022-08-30 NOTE — Progress Notes (Addendum)
Pt arrived to floor confused and not alert to even self. Follows commands but not answering questions appropriately. Tried a piece of ice and he spit it at me. Had to put mittens on him as he is grabbing his foley and jp drain. Saying inappropriate sexual things.  Will cont to monitor   . 2100 pt pulled mittens, iv, and clothing off. Walked in to him pulling on his foley. Pt confused. When approached for me to help pt started swinging at me. Paged E chen for restraints for pt and staff safety. Wrist restraits applied and monitored. Neuro paged for diet order and if heparin drip should cont and foley order.   0045 pt restraints d/c. Pt is back to baseline A&Ox4 and not combative and following instructions. E chen notified.

## 2022-08-30 NOTE — Significant Event (Signed)
Taken to preop  for surgery today via bed with transport. Personal belongs remain at bedside (clothes, back brace, black bag that has his shaving equipments; blue pillow, neck chain, black head phone; drinks and snacks).

## 2022-08-31 ENCOUNTER — Other Ambulatory Visit: Payer: Self-pay

## 2022-08-31 DIAGNOSIS — Z8614 Personal history of Methicillin resistant Staphylococcus aureus infection: Secondary | ICD-10-CM | POA: Diagnosis not present

## 2022-08-31 DIAGNOSIS — E119 Type 2 diabetes mellitus without complications: Secondary | ICD-10-CM | POA: Diagnosis not present

## 2022-08-31 DIAGNOSIS — M4624 Osteomyelitis of vertebra, thoracic region: Secondary | ICD-10-CM | POA: Diagnosis not present

## 2022-08-31 DIAGNOSIS — Z7189 Other specified counseling: Secondary | ICD-10-CM | POA: Diagnosis not present

## 2022-08-31 DIAGNOSIS — E876 Hypokalemia: Secondary | ICD-10-CM

## 2022-08-31 DIAGNOSIS — E162 Hypoglycemia, unspecified: Secondary | ICD-10-CM | POA: Diagnosis not present

## 2022-08-31 DIAGNOSIS — I10 Essential (primary) hypertension: Secondary | ICD-10-CM | POA: Diagnosis not present

## 2022-08-31 DIAGNOSIS — M4644 Discitis, unspecified, thoracic region: Secondary | ICD-10-CM | POA: Diagnosis not present

## 2022-08-31 DIAGNOSIS — E1169 Type 2 diabetes mellitus with other specified complication: Secondary | ICD-10-CM | POA: Diagnosis not present

## 2022-08-31 DIAGNOSIS — Z515 Encounter for palliative care: Secondary | ICD-10-CM | POA: Diagnosis not present

## 2022-08-31 LAB — RENAL FUNCTION PANEL
Albumin: 2.7 g/dL — ABNORMAL LOW (ref 3.5–5.0)
Anion gap: 12 (ref 5–15)
BUN: 7 mg/dL (ref 6–20)
CO2: 19 mmol/L — ABNORMAL LOW (ref 22–32)
Calcium: 9.2 mg/dL (ref 8.9–10.3)
Chloride: 103 mmol/L (ref 98–111)
Creatinine, Ser: 1.09 mg/dL (ref 0.61–1.24)
GFR, Estimated: >60 mL/min (ref >60)
Glucose, Bld: 155 mg/dL — ABNORMAL HIGH (ref 70–99)
Phosphorus: 4 mg/dL (ref 2.5–4.6)
Potassium: 4.2 mmol/L (ref 3.5–5.1)
Sodium: 134 mmol/L — ABNORMAL LOW (ref 135–145)

## 2022-08-31 LAB — POCT I-STAT 7, (LYTES, BLD GAS, ICA,H+H)
Acid-base deficit: 4 mmol/L — ABNORMAL HIGH (ref 0.0–2.0)
Bicarbonate: 21.1 mmol/L (ref 20.0–28.0)
Calcium, Ion: 1.34 mmol/L (ref 1.15–1.40)
HCT: 25 % — ABNORMAL LOW (ref 39.0–52.0)
Hemoglobin: 8.5 g/dL — ABNORMAL LOW (ref 13.0–17.0)
O2 Saturation: 100 %
Patient temperature: 36.7
Potassium: 4 mmol/L (ref 3.5–5.1)
Sodium: 136 mmol/L (ref 135–145)
TCO2: 22 mmol/L (ref 22–32)
pCO2 arterial: 37.7 mmHg (ref 32–48)
pH, Arterial: 7.354 (ref 7.35–7.45)
pO2, Arterial: 235 mmHg — ABNORMAL HIGH (ref 83–108)

## 2022-08-31 LAB — CBC
HCT: 27.4 % — ABNORMAL LOW (ref 39.0–52.0)
Hemoglobin: 8.9 g/dL — ABNORMAL LOW (ref 13.0–17.0)
MCH: 28.5 pg (ref 26.0–34.0)
MCHC: 32.5 g/dL (ref 30.0–36.0)
MCV: 87.8 fL (ref 80.0–100.0)
Platelets: 266 10*3/uL (ref 150–400)
RBC: 3.12 MIL/uL — ABNORMAL LOW (ref 4.22–5.81)
RDW: 15.9 % — ABNORMAL HIGH (ref 11.5–15.5)
WBC: 11.8 10*3/uL — ABNORMAL HIGH (ref 4.0–10.5)
nRBC: 0 % (ref 0.0–0.2)

## 2022-08-31 LAB — HEPARIN LEVEL (UNFRACTIONATED): Heparin Unfractionated: <0.1 IU/mL — ABNORMAL LOW (ref 0.30–0.70)

## 2022-08-31 LAB — GLUCOSE, CAPILLARY
Glucose-Capillary: 209 mg/dL — ABNORMAL HIGH (ref 70–99)
Glucose-Capillary: 258 mg/dL — ABNORMAL HIGH (ref 70–99)
Glucose-Capillary: 192 mg/dL — ABNORMAL HIGH (ref 70–99)

## 2022-08-31 LAB — MAGNESIUM: Magnesium: 1.4 mg/dL — ABNORMAL LOW (ref 1.7–2.4)

## 2022-08-31 MED ORDER — SODIUM CHLORIDE 0.9 % IV SOLN
8.0000 mg/kg | Freq: Every day | INTRAVENOUS | Status: AC
  Administered 2022-08-31 – 2022-09-27 (×28): 650 mg via INTRAVENOUS
  Filled 2022-08-31 (×28): qty 13

## 2022-08-31 MED ORDER — FENTANYL CITRATE PF 50 MCG/ML IJ SOSY
50.0000 ug | PREFILLED_SYRINGE | INTRAMUSCULAR | Status: DC | PRN
  Administered 2022-09-01 – 2022-09-04 (×20): 50 ug via INTRAVENOUS
  Filled 2022-08-31 (×20): qty 1

## 2022-08-31 MED ORDER — CHLORHEXIDINE GLUCONATE CLOTH 2 % EX PADS
6.0000 | MEDICATED_PAD | Freq: Every day | CUTANEOUS | Status: DC
  Administered 2022-08-31 – 2022-10-09 (×37): 6 via TOPICAL

## 2022-08-31 MED ORDER — MAGNESIUM OXIDE -MG SUPPLEMENT 400 (240 MG) MG PO TABS
400.0000 mg | ORAL_TABLET | Freq: Two times a day (BID) | ORAL | Status: DC
  Administered 2022-08-31 – 2022-10-28 (×117): 400 mg via ORAL
  Filled 2022-08-31 (×117): qty 1

## 2022-08-31 MED ORDER — MAGNESIUM SULFATE 4 GM/100ML IV SOLN
4.0000 g | Freq: Once | INTRAVENOUS | Status: AC
  Administered 2022-08-31: 4 g via INTRAVENOUS
  Filled 2022-08-31: qty 100

## 2022-08-31 MED ORDER — FENTANYL 25 MCG/HR TD PT72
1.0000 | MEDICATED_PATCH | TRANSDERMAL | Status: DC
  Administered 2022-08-31 – 2022-09-04 (×3): 1 via TRANSDERMAL
  Filled 2022-08-31 (×3): qty 1

## 2022-08-31 MED FILL — Thrombin For Soln 5000 Unit: CUTANEOUS | Qty: 5000 | Status: AC

## 2022-08-31 NOTE — Progress Notes (Signed)
[name]                                                                               David [name], is a 62 y.o. 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 Chestine Spore on 4/25 persistent A-fib on Eliquis, DM-2, seizure disorder, HTN, HLD and chronic pain return to ED with hypoglycemia to 38.  Patient was hospitalized for disseminated MRSA bacteremia from 4/9-5/17, and discharged on p.o. doxycycline.  Patient was hypoglycemic to 38 when EMS arrived.  His hypoglycemia persisted despite intervention in ED and leading to his rehospitalization.  He also have significant back pain.  MRI thoracic spine concerning for progressive discitis/osteomyelitis at T7-T9, extensive phlegmonous change in the paravertebral soft tissues,  partial collapse of T8 vertebral body with 40% vertebral body height loss and approximately 6 mm bony retropulsion, moderate canal stenosis and mild cord edema.  Neurosurgery and ID consulted.  Patient was started on IV vancomycin.  Neurosurgery planning surgical intervention today, 5/28.  Palliative medicine helping with pain management.  Therapy recommended CIR     Assessment & Plan    Assessment and Plan:  Disseminated MRSA bacteremia with T7 to T9 discitis and osteomyelitis:  MRI concerning for progressive discitis/osteomyelitis.  See details above.  Patient was hospitalized for this from 4/9-5/17 and discharged on doxycycline.  Previously treated with daptomycin followed by vancomycin and oritavancin. -Neurosurgery following- underwent surgical intervention on 5/28.  ID and palliative care consulted and on board.  ID recommended IV vancomycin .  Currently on MS contin 30 mg  every 12 hours, Lyrica 50 mg BID, oxycodone 15 mg every 4 hours prn, Robaxin 750 mg , tylenol, lidoderm patch.  Bowel regimen on board.  Narcan as needed.    Right foot osteomyelitis s/p right fifth ray amputation on 4/30.  Continue with antibiotics and dressing changes as per orthopedics.    Chronic HFrEF with recovered EF:  Euvolemic on exam.  Creatinine back to baseline.    Hypomagnesemia:  Replaced.   Aki From diuretics.  Resolved.    Hypertension:  Well controlled this  morning.  Continue with coreg 12.5 mg BID.  Prn IV labetalol.    Persistent atrial fibrillation  Rate controlled.    Moderate pleural effusion:  - monitor.    Hypokalemia Replaced.    Constipation:  Resolved.    Physical deconditioning  Therapy eval CIR.    Uncontrolled insulin dependent DM with hypoglycemia and hyperglycemia.  A1c is 9.1% CBG (last 3)  Recent Labs    08/30/22 1807 08/30/22 2136 08/31/22 1128  GLUCAP 154* 176* 209*   Continue to monitor.       Estimated body mass index is 24.91 kg/m as calculated from the following:   Height as of this encounter: 6' (1.829 m).   Weight as of this encounter: 83.3 kg.  Code Status: full  code.  DVT Prophylaxis:  SCD's Start: 08/30/22 1939   Level of Care: Level of care: Med-Surg Family Communication: none at bedside.   Disposition Plan:     Remains inpatient appropriate:  pending.   Procedures:  T5-6, T6-7, T7-8, T8-9, T9-10, T10-11 posterior/posterolateral arthrodesis  Consultants:   Neuro surgery.   Antimicrobials:   Anti-infectives (From admission, onward)    Start     Dose/Rate Route Frequency Ordered Stop   08/30/22 2300  vancomycin (VANCOREADY) IVPB 750 mg/150 mL        750 mg 150 mL/hr over 60 Minutes Intravenous Every 12 hours 08/30/22 1150     08/30/22 1712  vancomycin (VANCOCIN) powder  Status:  Discontinued          As needed 08/30/22 1714 08/30/22 1756   08/30/22 1142  ceFAZolin (ANCEF) 2-4  GM/100ML-% IVPB  Status:  Discontinued       Note to Pharmacy: [facility] [place], [place]: cabinet override      08/30/22 1142 08/30/22 1300   08/30/22 0600  ceFAZolin (ANCEF) IVPB 2g/100 mL premix        2 g 200 mL/hr over 30 Minutes Intravenous On call to O.R. 08/29/22 1848 08/30/22 1401   08/27/22 1041  vancomycin (VANCOCIN) IVPB 1000 mg/200 mL premix  Status:  Discontinued        1,000 mg 200 mL/hr over 60 Minutes Intravenous Every 12 hours 08/27/22 0802 08/30/22 1150   08/26/22 2300  vancomycin (VANCOREADY) IVPB 1250 mg/250 mL  Status:  Discontinued        1,250 mg 166.7 mL/hr over 90 Minutes Intravenous Every 24 hours 08/26/22 1014 08/27/22 0802   08/23/22 2200  vancomycin (VANCOREADY) IVPB 750 mg/150 mL  Status:  Discontinued        750 mg 150 mL/hr over 60 Minutes Intravenous Every 12 hours 08/23/22 0938 08/26/22 1014   08/23/22 1030  vancomycin (VANCOREADY) IVPB 1500 mg/300 mL        1,500 mg 150 mL/hr over 120 Minutes Intravenous  Once 08/23/22 0938 08/23/22 1312   08/20/22 2200  doxycycline (VIBRA-TABS) tablet 100 mg  Status:  Discontinued        100 mg Oral 2 times daily 08/20/22 1737 08/23/22 0927        Medications  Scheduled Meds:  acetaminophen  1,000 mg Oral TID   atorvastatin  80 mg Oral Daily   busPIRone  15 mg Oral BID   carvedilol  12.5 mg Oral BID   digoxin  125 mcg Oral Daily   famotidine  20 mg Oral BID   feeding supplement  237 mL Oral BID BM   fluticasone  1 spray Each Nare Daily   insulin aspart  0-5 Units Subcutaneous QHS   insulin aspart  0-9 Units Subcutaneous TID WC   lidocaine  1 patch Transdermal Q24H   methocarbamol  750 mg Oral QID   morphine  30 mg Oral Q12H   pantoprazole  40 mg Oral Daily   polyethylene glycol  17 g Oral BID   pregabalin  50 mg Oral TID   senna-docusate  1 tablet Oral BID   sodium chloride flush  3 mL Intravenous Q12H   Continuous Infusions:  sodium chloride 75 mL/hr at 08/31/22 0354   sodium chloride     vancomycin Stopped  (08/31/22 0003)   PRN Meds:.HYDROmorphone (DILAUDID) injection, labetalol, ondansetron **OR** ondansetron (ZOFRAN) IV, mouth rinse, oxyCODONE, oxyCODONE, sodium chloride flush    Subjective:   [name] was seen and  examined today.  No events overnight.   Objective:   Vitals:   08/30/22 1951 08/30/22 2342 08/31/22 0322 08/31/22 0741  BP: (!) 163/106 (!) 161/90 117/85 131/89  Pulse: (!) 109 68 81 70  Resp: 20 18 18 20   Temp: 97.9 F (36.6 C) 98.2 F (36.8 C) 98.3 F (36.8 C) 99.4 F (37.4 C)  TempSrc: Axillary Oral Oral Oral  SpO2: 98% 97% 100% 97%  Weight:      Height:        Intake/Output Summary (Last 24 hours) at 08/31/2022 0902 Last data filed at 08/31/2022 0737 Gross per 24 hour  Intake 3008.35 ml  Output 2930 ml  Net 78.35 ml   Filed Weights   08/20/22 1821 08/23/22 2307  Weight: 77.8 kg 83.3 kg     Exam General exam: Appears calm and comfortable  Respiratory system: Clear to auscultation. Respiratory effort normal. Cardiovascular system: S1 & S2 heard, RRR. No JVD,  Gastrointestinal system: Abdomen is nondistended, soft and nontender.  Central nervous system: Alert and oriented to person and place only,  in the chair. Strength is 3/5 in the lower extremities.  Extremities: no edema.  Skin: No rashes, Psychiatry: Mood & affect appropriate.     Data Reviewed:  I have personally reviewed following labs and imaging studies   CBC Lab Results  Component Value Date   WBC 11.8 (H) 08/31/2022   RBC 3.12 (L) 08/31/2022   HGB 8.9 (L) 08/31/2022   HCT 27.4 (L) 08/31/2022   MCV 87.8 08/31/2022   MCH 28.5 08/31/2022   PLT 266 08/31/2022   MCHC 32.5 08/31/2022   RDW 15.9 (H) 08/31/2022   LYMPHSABS 0.7 08/20/2022   MONOABS 1.0 08/20/2022   EOSABS 0.0 08/20/2022   BASOSABS 0.1 08/20/2022     Last metabolic panel Lab Results  Component Value Date   NA 134 (L) 08/31/2022   K 4.2 08/31/2022   CL 103 08/31/2022   CO2 19 (L) 08/31/2022   BUN 7  08/31/2022   CREATININE 1.09 08/31/2022   GLUCOSE 155 (H) 08/31/2022   GFRNONAA >60 08/31/2022   GFRAA >60 08/30/2019   CALCIUM 9.2 08/31/2022   PHOS 4.0 08/31/2022   PROT 7.0 08/21/2022   ALBUMIN 2.7 (L) 08/31/2022   LABGLOB 2.4 01/08/2021   AGRATIO 1.7 01/08/2021   BILITOT 0.6 08/21/2022   ALKPHOS 82 08/21/2022   AST 11 (L) 08/21/2022   ALT 13 08/21/2022   ANIONGAP 12 08/31/2022    CBG (last 3)  Recent Labs    08/30/22 1713 08/30/22 1807 08/30/22 2136  GLUCAP 143* 154* 176*      Coagulation Profile: Recent Labs  Lab 08/29/22 2011  INR 1.2     Radiology Studies: DG Thoracic Spine 2 View  Result Date: 08/30/2022 CLINICAL DATA:  Thoracic fusion and partial corpectomy EXAM: THORACIC SPINE 2 VIEWS COMPARISON:  MRI from 08/22/2022 FLUOROSCOPY TIME:  Radiation Exposure Index (as provided by the fluoroscopic device): 12.05 mGy If the device does not provide the exposure index: Fluoroscopy Time:  20 seconds Number of Acquired Images:  6 FINDINGS: Initial images demonstrate intraoperative localization in the lower thoracic spine. Surgical retractors were then placed. T8 compression fracture is again seen. Pedicle screws are noted extending from T5-T11 with posterior fixation. IMPRESSION: Thoracic decompression with fixation as described from T5-T11. Electronically Signed   By: Alcide Clever M.D.   On: 08/30/2022 21:50   DG O-ARM IMAGE ONLY/NO REPORT  Result Date: 08/30/2022 There is no Radiologist interpretation  for this exam.      Kathlen Mody M.D. [name] 08/31/2022, 9:02 AM  Available via Epic secure chat 7am-7pm After 7 pm, please refer to night coverage provider listed on amion.

## 2022-08-31 NOTE — Progress Notes (Signed)
Subjective: No acute events o/n.   Objective: Vital signs in last 24 hours: Temp:  [97.9 F (36.6 C)-99.4 F (37.4 C)] 98.9 F (37.2 C) (05/29 1124) Pulse Rate:  [64-117] 76 (05/29 1124) Resp:  [13-20] 20 (05/29 1124) BP: (110-163)/(76-106) 110/77 (05/29 1124) SpO2:  [92 %-100 %] 94 % (05/29 1124)  Intake/Output from previous day: 05/28 0701 - 05/29 0700 In: 3008.4 [I.V.:2258.4; IV Piggyback:750] Out: 2880 [Urine:2175; Drains:305; Blood:400] Intake/Output this shift: Total I/O In: 480 [P.O.:480] Out: 100 [Drains:100]  Awake, alert, oriented Speech fluent, appropriate 5/5 BUE. 3/5 RLE, 2/5 LLE.  MAE FC x4 SILTx4 Wound c/d/i  Lab Results: Recent Labs    08/30/22 0312 08/30/22 1639 08/31/22 0734  WBC 6.7  --  11.8*  HGB 10.2* 8.5* 8.9*  HCT 31.1* 25.0* 27.4*  PLT 251  --  266   BMET Recent Labs    08/29/22 0415 08/30/22 1639 08/31/22 0603  NA 133* 136 134*  K 3.8 4.0 4.2  CL 105  --  103  CO2 21*  --  19*  GLUCOSE 182*  --  155*  BUN 7  --  7  CREATININE 0.97  --  1.09  CALCIUM 8.6*  --  9.2    Studies/Results: DG Thoracic Spine 2 View  Result Date: 08/30/2022 CLINICAL DATA:  Thoracic fusion and partial corpectomy EXAM: THORACIC SPINE 2 VIEWS COMPARISON:  MRI from 08/22/2022 FLUOROSCOPY TIME:  Radiation Exposure Index (as provided by the fluoroscopic device): 12.05 mGy If the device does not provide the exposure index: Fluoroscopy Time:  20 seconds Number of Acquired Images:  6 FINDINGS: Initial images demonstrate intraoperative localization in the lower thoracic spine. Surgical retractors were then placed. T8 compression fracture is again seen. Pedicle screws are noted extending from T5-T11 with posterior fixation. IMPRESSION: Thoracic decompression with fixation as described from T5-T11. Electronically Signed   By: Alcide Clever M.D.   On: 08/30/2022 21:50   DG O-ARM IMAGE ONLY/NO REPORT  Result Date: 08/30/2022 There is no Radiologist interpretation   for this exam.   Assessment/Plan: S/p T5-T11 PSF/laminectomy T8-9 for osteomyelitis/discitis    LOS: 8 days  PT/OT as tolerated. Continue JP drain until tmrw.  Continue supportive care.  Call w/ questions/concerns.    David [name] David [name] David [name] 08/31/2022, 11:41 AM

## 2022-08-31 NOTE — Progress Notes (Signed)
Inpatient Rehab Admissions Coordinator:   At this time we are recommending a CIR consult and I will place an order per our protocol.    Estill Dooms, PT, DPT Admissions Coordinator [phone] 08/31/22  5:25 PM

## 2022-08-31 NOTE — Evaluation (Addendum)
Occupational Therapy Re-Evaluation Patient Details Name: David [name] MRN: [id number] DOB: 30-Dec-1960 Today's Date: 08/31/2022   History of Present Illness Pt is a 62 y.o. male who presented 08/20/22  with hypoglycemia. 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 5/28 surgical repair: T5-11 posterior/posterolateral arthrodesis  & Transpedicular decompression with partial corpectomy, left T8 & T8-9 laminectomy, bilateral facetectomy. 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 Upon re-evaluation he continues to be significantly limited by pain, dizziness, impaired cognition, poor activity tolerance, and balance. Pt pre-medicated and session completed with PT for safety. Overall he required mod A for rolling and max A +2 for side>sit transfer with 1 step cues to initiate and sequence all tasks. Due to pain and weakness he needed max A +2 for STS and SP transfer with bilateral knee hyperextension and buckling noted. Due to the deficits listed below he also needs total  A+2 for LB ADLs and up to max A for UB ADLs. Pt will benefit from continued acute OT services and intensive inpatient follow up therapy, >3 hours/day after discharge.   Pt with report of dizziness upon sitting but resolved with rest: Orthostatic BP: Supine: 139/73 Sitting: 124/85 Sitting after stand-pivot transfer: 112/64  (no report of dizziness)         Recommendations for follow up therapy are one component of a multi-disciplinary discharge planning process, led by the attending physician.  Recommendations may be updated based on patient  status, additional functional criteria and insurance authorization.   Assistance Recommended at Discharge Frequent or constant Supervision/Assistance  Patient can return home with the following A lot of help with walking and/or transfers;A lot of help with bathing/dressing/bathroom    Functional Status Assessment  Patient has had a recent decline in their functional status and demonstrates the ability to make significant improvements in function in a reasonable and predictable amount of time.  Equipment Recommendations  BSC/3in1    Recommendations for Other Services Rehab consult     Precautions / Restrictions Precautions Precautions: Fall;Back Precaution Booklet Issued: No Precaution Comments: JP drain Required Braces or Orthoses: Spinal Brace Spinal Brace: Thoracolumbosacral orthotic;Applied in sitting position Other Brace: for use when ambulating Restrictions Weight Bearing Restrictions: Yes RLE Weight Bearing: Partial weight bearing RLE Partial Weight Bearing Percentage or Pounds: heel weight bearing Other Position/Activity Restrictions: with Darco shoe (not present in room)      Mobility Bed Mobility Overal bed mobility: Needs Assistance Bed Mobility: Rolling, Sidelying to Sit Rolling: +2 for safety/equipment, Mod assist Sidelying to sit: Max assist, +2 for safety/equipment, +2 for physical assistance       General bed mobility comments: needs simple 1 step directions    Transfers Overall transfer level: Needs assistance Equipment used: Rolling walker (2 wheels), 2 person hand held assist Transfers: Sit to/from Stand, Bed to chair/wheelchair/BSC Sit to Stand: Max assist, +2 physical assistance, +2 safety/equipment Stand pivot transfers: Max assist, +2 physical assistance, +2 safety/equipment, From elevated surface         General transfer comment: Pt's knees hyperextending and buckled bilaterally with SP transfer. Pt but utilizing UEs on RW for support - would  recommend 2 person face to face transfer w/o RW for safety.      Balance Overall balance assessment: Needs assistance Sitting-balance support: Feet supported Sitting balance-Leahy Scale: Poor     Standing balance support: Bilateral upper extremity supported, During functional activity Standing balance-Leahy Scale: Zero                             ADL either performed or assessed with clinical judgement   ADL Overall ADL's : Needs assistance/impaired Eating/Feeding: Set up;Sitting   Grooming: Set up;Sitting   Upper Body Bathing: Moderate assistance;Sitting   Lower Body Bathing: Total assistance;+2 for physical assistance;+2 for safety/equipment;Sit to/from stand   Upper Body Dressing : Moderate assistance;Sitting Upper Body Dressing Details (indicate cue type and reason): total A for TLSO Lower Body Dressing: +2 for physical assistance;Total assistance;+2 for safety/equipment;Sit to/from stand   Toilet Transfer: Maximal assistance;+2 for physical assistance;+2 for safety/equipment;Stand-pivot   Toileting- Clothing Manipulation and Hygiene: +2 for physical assistance;Total assistance;+2 for safety/equipment       Functional mobility during ADLs: Maximal assistance;+2 for physical assistance;+2 for safety/equipment General ADL Comments: extremely limited by pain, back precautions, weakness     Vision Baseline Vision/History: 0 No visual deficits Vision Assessment?: No apparent visual deficits     Perception Perception Perception Tested?: No   Praxis Praxis Praxis tested?: Not tested    Pertinent Vitals/Pain Pain Assessment Pain Assessment: Faces Faces Pain Scale: Hurts whole lot Pain Location: back & chest tube site Pain Descriptors / Indicators: Discomfort, Grimacing, Guarding Pain Intervention(s): Limited activity within patient's tolerance, Monitored during session, Premedicated before session     Hand Dominance Right   Extremity/Trunk  Assessment Upper Extremity Assessment Upper Extremity Assessment: Generalized weakness   Lower Extremity Assessment Lower Extremity Assessment: Defer to PT evaluation   Cervical / Trunk Assessment Cervical / Trunk Assessment: Back Surgery Cervical / Trunk Exceptions: pain, JP drain   Communication Communication Communication: No difficulties   Cognition Arousal/Alertness: Awake/alert Behavior During Therapy: Anxious Overall Cognitive Status: Impaired/Different from baseline Area of Impairment: Memory, Following commands, Attention, Safety/judgement, Awareness, Problem solving                   Current Attention Level: Sustained Memory: Decreased short-term memory, Decreased recall of precautions Following Commands: Follows one step commands with increased time Safety/Judgement: Decreased awareness of safety, Decreased awareness of deficits Awareness: Intellectual Problem Solving: Slow processing, Decreased initiation, Difficulty sequencing General Comments: very anxious and internally distracted. Needs a non-distracting environment during all functional tasks. Had difficulty with sequencing through simple transfers, needs simple one step cues with repetition for attention and memory. Appreciates for therapists to tell him everything prior to something happening (ex. give verbal warning before moving bed height).     General Comments  Complaints of dizziness uopn sitting but recovered with rest. BP did drop thoguhout session            Home Living Family/patient expects to be discharged to:: Private residence Living Arrangements: Spouse/significant other Available Help at Discharge: Family Type of Home: Apartment Home Access: Elevator     Home Layout: One level     Bathroom Shower/Tub: Producer, television/film/video: Standard Bathroom Accessibility: No   Home Equipment: Rollator (4 wheels);Cane - single point;Shower seat;Grab bars - toilet;Grab bars -  tub/shower   Additional Comments: pt reported a completely different home setup to OT on 5/24 in comparison to recent admission. reported to this  OT, one level home with tub shower      Prior Functioning/Environment Prior Level of Function : Independent/Modified Independent;Working/employed;Driving             Mobility Comments: utilized [facility] for mobility PTA 07/12/22, pt was using a RW at d/c 08/28/22 ADLs Comments: Previously Independent to Mod I with ADLs and IADLs and was driving.        OT Problem List: Decreased strength;Decreased range of motion;Decreased activity tolerance;Impaired balance (sitting and/or standing);Decreased cognition;Decreased safety awareness;Decreased knowledge of use of DME or AE;Decreased knowledge of precautions;Pain;Impaired UE functional use      OT Treatment/Interventions: Self-care/ADL training;Therapeutic exercise;Energy conservation;DME and/or AE instruction;Therapeutic activities;Patient/family education;Balance training    OT Goals(Current goals can be found in the care plan section) Acute Rehab OT Goals Patient Stated Goal: less pain OT Goal Formulation: With patient Time For Goal Achievement: 09/14/22 Potential to Achieve Goals: Fair ADL Goals Pt Will Perform Lower Body Bathing: with mod assist;sit to/from stand Pt Will Perform Lower Body Dressing: with mod assist;sit to/from stand Pt Will Transfer to Toilet: bedside commode;stand pivot transfer Additional ADL Goal #1: Pt will tolerate OOB functional tasks for at least 5 minutes withtout sitting rest break to demonstrate increased endurance  OT Frequency: Min 2X/week    Co-evaluation PT/OT/SLP Co-Evaluation/Treatment: Yes Reason for Co-Treatment: Complexity of the patient's impairments (multi-system involvement);For patient/therapist safety;To address functional/ADL transfers   OT goals addressed during session: ADL's and self-care      AM-PAC OT "6 Clicks" Daily Activity     Outcome  Measure Help from another person eating meals?: None Help from another person taking care of personal grooming?: A Little Help from another person toileting, which includes using toliet, bedpan, or urinal?: Total Help from another person bathing (including washing, rinsing, drying)?: Total Help from another person to put on and taking off regular upper body clothing?: A Lot Help from another person to put on and taking off regular lower body clothing?: Total 6 Click Score: 12   End of Session Equipment Utilized During Treatment: Rolling walker (2 wheels);Back brace Nurse Communication: Mobility status  Activity Tolerance: Patient tolerated treatment well Patient left: in chair;with call bell/phone within reach;with chair alarm set  OT Visit Diagnosis: Unsteadiness on feet (R26.81);Other abnormalities of gait and mobility (R26.89);Muscle weakness (generalized) (M62.81);Pain                Time: 1610-9604 OT Time Calculation (min): 31 min Charges:  OT General Charges $OT Visit: 1 Visit OT Treatments $Therapeutic Activity: 8-22 mins  Derenda Mis, OTR/L Acute Rehabilitation Services Office [phone] Secure Chat Communication Preferred   Donia Pounds 08/31/2022, 11:28 AM

## 2022-08-31 NOTE — Progress Notes (Signed)
Okay to place PICC per Dr. Vu with Infectious Disease. 

## 2022-08-31 NOTE — Progress Notes (Signed)
Palliative Care Progress Note  POD#1 from T5-11 spinal fusion and laminectomy for osteomyelitis.discitis. Palliative care following for complex pain management given high level of debility and co-morbidities.  He is cognitively very different from baseline today. Difficulty focusing on conversation and delayed word finding. He tells me he is aware tat he is "not himself". Further says that he can't recognize people or remember details about his situation. He is too tangential for me to get a good pain history. He appears to be in moderate distress-his breathing is heavy and he appears anxious, mildly diaphoretic. He is OOB in chair and reports being "moved around a lot".  Pupils are normal size. Nothing grossly focal.   Current issues:  Pain/Post-Operative Delirium, etiology unknown presently, he may be having some early opioid neurotoxicity given his widely variable opioid regimen with combo of IV hydromorphone, oral oxycodone, oral morphine. Monitor for bowel and bladder issues including retention Maintain non-pharm interventions for delirium Trazadone qhs for sleep Switch to Fentanyl because of possible neurotoxicity from opioids Start Duragesic patch IV fentanyl bolus q2 prn severe pain Leave prn oxycodone for oral-moderate pain Scheduled Tylenol Magnesium Repletion start daily BID dosing- improves effectiveness of opioids.  Anderson Malta, DO Palliative Medicine

## 2022-08-31 NOTE — Evaluation (Signed)
Physical Therapy Re-Evaluation Patient Details Name: David [name] MRN: [id number] DOB: 1960/10/19 Today's Date: 08/31/2022  History of Present Illness  Pt is a 62 y.o. male who presented 08/20/22  with hypoglycemia. 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 5/28 surgical repair: T5-11 posterior/posterolateral arthrodesis  & Transpedicular decompression with partial corpectomy, left T8 & T8-9 laminectomy, bilateral facetectomy. 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 Pt continues to be limited by pain, even with being premedicated this date. He required increased assistance of maxAx2 to transition supine to sit and transfer to stand from EOB today. His bil knees would buckle in standing and he displayed poor use of the RW for support, needing maxAx2 to stand pivot to the recliner. Recommending use of maximove lift for nursing staff and attempt bil HHA with therapy in future sessions. Will continue to follow acutely.       Recommendations for follow up therapy are one component of a multi-disciplinary discharge planning process, led by the attending physician.  Recommendations may be updated based on patient status, additional functional criteria and insurance authorization.  Follow Up Recommendations       Assistance Recommended at Discharge Frequent or constant Supervision/Assistance  Patient can return home with the following  Assist for transportation;Two people to help with walking and/or transfers;A lot of help with bathing/dressing/bathroom;Assistance with cooking/housework;Direct supervision/assist  for financial management;Direct supervision/assist for medications management    Equipment Recommendations Other (comment) (TBA further)  Recommendations for Other Services  Rehab consult    Functional Status Assessment Patient has had a recent decline in their functional status and demonstrates the ability to make significant improvements in function in a reasonable and predictable amount of time.     Precautions / Restrictions Precautions Precautions: Fall;Back Precaution Booklet Issued: No Precaution Comments: JP drain Required Braces or Orthoses: Spinal Brace Spinal Brace: Thoracolumbosacral orthotic;Applied in sitting position Other Brace: for use when ambulating Restrictions Weight Bearing Restrictions: Yes RLE Weight Bearing: Partial weight bearing RLE Partial Weight Bearing Percentage or Pounds: heel weight bearing Other Position/Activity Restrictions: with Darco shoe (appears to be lost - currently working on getting new one for pt)      Mobility  Bed Mobility Overal bed mobility: Needs Assistance Bed Mobility: Rolling, Sidelying to Sit Rolling: +2 for safety/equipment, Mod assist Sidelying to sit: Max assist, +2 for safety/equipment, +2 for physical assistance       General bed mobility comments: needs simple 1 step directions to flex leg and reach for rail to roll then bring legs off EOB and ascend trunk to sit L EOB.    Transfers Overall transfer level: Needs assistance Equipment used: Rolling walker (2 wheels), 2 person hand held assist Transfers: Sit to/from Stand, Bed to chair/wheelchair/BSC Sit to Stand: Max assist, +2 physical assistance, +2 safety/equipment, From elevated surface Stand pivot transfers: Max assist, +2 physical assistance, +2 safety/equipment, From elevated surface         General transfer comment: MaxAx2 using bed pad as sling to boost pt's buttocks up to stand from elevated EOB 2x then maxAx2 to pivot his hips and move the RW to the L  to the recliner. Pt's knees hyperextending and buckled bilaterally with SP transfer. Pt not utilizing UEs on RW well for support - would recommend 2 person face to face transfer w/o RW for safety.    Ambulation/Gait               General Gait Details: unable at this time  Stairs            Wheelchair Mobility    Modified Rankin (Stroke Patients Only)       Balance Overall balance assessment: Needs assistance Sitting-balance support: Feet supported Sitting balance-Leahy Scale: Poor     Standing balance support: Bilateral upper extremity supported, During functional activity Standing balance-Leahy Scale: Zero Standing balance comment: MaxAx2 to stand with bil knees buckling                             Pertinent Vitals/Pain Pain Assessment Pain Assessment: Faces Faces Pain Scale: Hurts whole lot Pain Location: back & old chest tube site Pain Descriptors / Indicators: Discomfort, Grimacing, Guarding Pain Intervention(s): Limited activity within patient's tolerance, Monitored during session, Premedicated before session, Repositioned    Home Living Family/patient expects to be discharged to:: Private residence Living Arrangements: Spouse/significant other Available Help at Discharge: Family Type of Home: Apartment Home Access: Elevator       Home Layout: One level Home Equipment: Rollator (4 wheels);Cane - single point;Shower seat;Grab bars - toilet;Grab bars - tub/shower Additional Comments: pt reported a completely different home setup to OT on 5/24 in comparison to recent admission. reported to this OT, one level home with tub shower    Prior Function Prior Level of Function : Independent/Modified Independent;Working/employed;Driving             Mobility Comments: utilized [facility] for mobility PTA 07/12/22, pt was using a RW at d/c 08/28/22 ADLs Comments: Previously Independent to Mod I with ADLs and IADLs and was driving.     Hand Dominance    Dominant Hand: Right    Extremity/Trunk Assessment   Upper Extremity Assessment Upper Extremity Assessment: Defer to OT evaluation    Lower Extremity Assessment Lower Extremity Assessment: Generalized weakness RLE Deficits / Details: recent 5th metatarsal amputation    Cervical / Trunk Assessment Cervical / Trunk Assessment: Back Surgery Cervical / Trunk Exceptions: pain, JP drain  Communication   Communication: No difficulties  Cognition Arousal/Alertness: Awake/alert Behavior During Therapy: Anxious Overall Cognitive Status: Impaired/Different from baseline Area of Impairment: Memory, Following commands, Attention, Safety/judgement, Awareness, Problem solving                   Current Attention Level: Sustained Memory: Decreased short-term memory, Decreased recall of precautions Following Commands: Follows one step commands with increased time Safety/Judgement: Decreased awareness of safety, Decreased awareness of deficits Awareness: Intellectual Problem Solving: Slow processing, Decreased initiation, Difficulty sequencing General Comments: very anxious and internally distracted. Needs a non-distracting environment during all functional tasks. Had difficulty with sequencing through simple transfers, needs simple one step cues with repetition for attention and memory. Appreciates for therapists to tell him everything prior to something happening (ex. give verbal warning before moving bed height).        General Comments General comments (skin integrity, edema, etc.): BP - 139/73 supine start of session, 124/85 sitting EOB (dizziness reported), 112/64 sitting end of session after stand pivot transfer (denied any further dizziness)    Exercises     Assessment/Plan    PT Assessment  Patient needs continued PT services  PT Problem List Decreased strength;Decreased activity tolerance;Decreased balance;Decreased mobility;Decreased cognition;Decreased knowledge of use of  DME;Decreased safety awareness;Decreased knowledge of precautions;Pain       PT Treatment Interventions DME instruction;Gait training;Functional mobility training;Therapeutic activities;Therapeutic exercise;Balance training;Neuromuscular re-education;Cognitive remediation;Patient/family education;Wheelchair mobility training    PT Goals (Current goals can be found in the Care Plan section)  Acute Rehab PT Goals Patient Stated Goal: to improve and reduce pain PT Goal Formulation: With patient/family Time For Goal Achievement: 09/14/22 Potential to Achieve Goals: Good    Frequency Min 5X/week     Co-evaluation PT/OT/SLP Co-Evaluation/Treatment: Yes Reason for Co-Treatment: Complexity of the patient's impairments (multi-system involvement);For patient/therapist safety;To address functional/ADL transfers PT goals addressed during session: Mobility/safety with mobility;Balance;Proper use of DME         AM-PAC PT "6 Clicks" Mobility  Outcome Measure Help needed turning from your back to your side while in a flat bed without using bedrails?: A Lot Help needed moving from lying on your back to sitting on the side of a flat bed without using bedrails?: Total Help needed moving to and from a bed to a chair (including a wheelchair)?: Total Help needed standing up from a chair using your arms (e.g., wheelchair or bedside chair)?: Total Help needed to walk in hospital room?: Total Help needed climbing 3-5 steps with a railing? : Total 6 Click Score: 7    End of Session Equipment Utilized During Treatment: Back brace Activity Tolerance: Patient limited by pain Patient left: in chair;with call bell/phone within reach;with chair alarm set Nurse Communication: Mobility status;Need for lift equipment;Other (comment) (BP) PT Visit Diagnosis: Difficulty in walking, not elsewhere classified (R26.2);Other abnormalities of gait and mobility (R26.89);Muscle weakness (generalized)  (M62.81);Pain;Unsteadiness on feet (R26.81) Pain - Right/Left: Right Pain - part of body: Ankle and joints of foot (back)    Time: 0981-1914 PT Time Calculation (min) (ACUTE ONLY): 37 min   Charges:   PT Evaluation $PT Re-evaluation: 1 Re-eval          Raymond Gurney, PT, DPT Acute Rehabilitation Services  Office: [phone]   Jewel Baize 08/31/2022, 4:15 PM

## 2022-08-31 NOTE — Anesthesia Postprocedure Evaluation (Signed)
Anesthesia Post Note  Patient: Raymond Kallay  Procedure(s) Performed: THORACIC FIVE-THORACIC ELEVEN FUSION, THORACIC EIGHT TRANSPEDICULAR DECOMPRESSION AND PARTIAL CORPECTOMY with O-Arm (Back) Application of O-Arm     Patient location during evaluation: PACU Anesthesia Type: General Level of consciousness: awake and alert Pain management: pain level controlled Vital Signs Assessment: post-procedure vital signs reviewed and stable Respiratory status: spontaneous breathing, nonlabored ventilation, respiratory function stable and patient connected to nasal cannula oxygen Cardiovascular status: blood pressure returned to baseline and stable Postop Assessment: no apparent nausea or vomiting Anesthetic complications: no  No notable events documented.  Last Vitals:  Vitals:   08/31/22 0322 08/31/22 0741  BP: 117/85 131/89  Pulse: 81 70  Resp: 18 20  Temp: 36.8 C 37.4 C  SpO2: 100% 97%    Last Pain:  Vitals:   08/31/22 0741  TempSrc: Oral  PainSc:                  Princella Jaskiewicz S

## 2022-08-31 NOTE — Progress Notes (Signed)
Orthopedic Tech Progress Note Patient Details:  David [name] 01/29/1961 [id number]  Ortho Devices Type of Ortho Device: Darco shoe Ortho Device/Splint Location: RLE Ortho Device/Splint Interventions: Ordered   Post Interventions Patient Tolerated: Well Instructions Provided: Care of device  [name] 08/31/2022, 5:07 PM

## 2022-08-31 NOTE — Progress Notes (Signed)
[facility]  Date of Admission:  08/20/2022       Abx: 5/21-c vanc 5/9-c doxy   Dapto 4/13-14, 4/24-22, 5/9-15 Vanc 4/9-11, 4/23-5/8 Oritavancin 4/15    ASSESSMENT/recommendation: 62 year old male chf, afib on eliquis, dm2, recent mrsa bacteremia in setting OM of the right foot (5th ray) and seeding of tspine (dicitis, multilevel loculated prevertebral abscess, right pleural empyema) with om/discitis on doxy tail end treatment, admitted with opiate overdose and repeat imaging this admission showed progressive destructive findings of spine   #Recent history of MRSA bacteremia likely from 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  He stated that he did not time that well.  5/18 CTA chest on arrival showed proximal aggressive discitis/osteomyelitis.  Recommended MRI T spine for further characterization. -Patient has completed above 5 weeks antibiotics of vancomycin and then Dapto and transition to doxycycline alone on 5/15. - Repeat thoracic spine MRI on 5/20 showed progressive discitis/osteomyelitis at T7-T9, partial collapse of vertebral body 40% body with height loss at T8, 10 ventral epidural phlegmon T7-T9, extensive phlegmonous changes the paravertebral soft tissue additionally involve the mid thoracic levels.  Bilateral pleural effusions complex on the right. -- 5/28 s/p extensive thoracic I&D, decompression, spinal stabilization hardware as mentioned below in subjective section. 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 -- given hardware and staph aureus infection, will need indefinite antibiotics therapy  Recommendations:  -for better toxicity profile, we'll change vanc to daptomycin - patient is not an opat candidate and can't be placed for some  reason - plan 2-4 weeks iv abx then transition to doxycycline indefinitely; 2 weeks Yussef from 5/28 would be 6/11 and 4 week Amias would be 6/25 - if he is able to go home early prior to 6/25, potentially can give a dose long acting glycopeptide and start on doxy at discharge - weekly cbc, cmp, cpk (on dapto), and crp - id clinic follow up with dr Daiva Eves on 6/24 @ 345 pm - if patient remains in house around 6/25 or other ID concern, please reengage id for reevaluation - discussed with primary team        Principal Problem:   Hypoglycemia Active Problems:   Essential hypertension   Insulin dependent type 2 diabetes mellitus (HCC)   Persistent atrial fibrillation (HCC)   Pure hypercholesterolemia   Cellulitis in diabetic foot (HCC)   Diskitis   No Known Allergies  Scheduled Meds:  acetaminophen  1,000 mg Oral TID   atorvastatin  80 mg Oral Daily   busPIRone  15 mg Oral BID   carvedilol  12.5 mg Oral BID   digoxin  125 mcg Oral Daily   famotidine  20 mg Oral BID   feeding supplement  237 mL Oral BID BM   fluticasone  1 spray Each Nare Daily   insulin aspart  0-5 Units Subcutaneous QHS   insulin aspart  0-9 Units Subcutaneous TID WC   lidocaine  1 patch Transdermal Q24H   methocarbamol  750 mg Oral QID   morphine  30 mg Oral Q12H   pantoprazole  40 mg Oral Daily   polyethylene glycol  17 g Oral BID   pregabalin  50 mg Oral TID   senna-docusate  1 tablet Oral BID   sodium chloride flush  3 mL Intravenous Q12H   Continuous Infusions:  sodium chloride 75 mL/hr at 08/31/22 0354   sodium chloride     magnesium sulfate bolus IVPB     vancomycin Stopped (08/31/22 0003)   PRN Meds:.HYDROmorphone (DILAUDID) injection, labetalol, ondansetron **OR** ondansetron (ZOFRAN) IV, mouth rinse, oxyCODONE, oxyCODONE, sodium chloride flush   SUBJECTIVE: 5/28 s/p I&D and stabilization of thoracic spine. I reviewed operative note. Severely fractured t8 with adherent phlegmon along  thoracic epidural space. T8 vertebral body sent for culture which is showing gpc on stain PROCEDURE: 1. T5-6, T6-7, T7-8, T8-9, T9-10, T10-11 osterior/posterolateral arthrodesis 2. Transpedicular decompression with partial corpectomy, left T8 3. T8-9 laminectomy, bilateral facetectomy 4. Segmental instrumentation with percutaneously placed pedicle screw and rod construct at T5-6-7-11-10-08-11 5. Harvest of local autograft 6. Use of morselized allograft 7.  Intraoperative neuronavigation with Stealth 8. Intraoperative CT scan IMPLANTS:  T5: 5.5 x 35 mm screws x 2 T6: 5.5 x 40 mm screws x 2 T7: 5.5 x 40/45 mm screws T9: 5.5 x 30 mm on right T10: 5.5 x 45/50 mm screws T11: 6.5 x 45/50 mm screws   Review of Systems: ROS All other ROS was negative, except mentioned above     OBJECTIVE: Vitals:   08/30/22 1951 08/30/22 2342 08/31/22 0322 08/31/22 0741  BP: (!) 163/106 (!) 161/90 117/85 131/89  Pulse: (!) 109 68 81 70  Resp: 20 18 18 20   Temp: 97.9 F (36.6 C) 98.2 F (36.8 C) 98.3 F (36.8 C) 99.4 F (37.4 C)  TempSrc: Axillary Oral Oral Oral  SpO2: 98% 97% 100% 97%  Weight:      Height:       Body mass index is 24.91 kg/m.  Physical Exam General/constitutional: no distress, pleasant; sitting in chair HEENT: Normocephalic, PER, Conj Clear, EOMI, Oropharynx clear Neck supple CV: rrr no mrg Lungs: clear to auscultation, normal respiratory effort Abd: soft, distended, mildly tender Ext: no edema Skin: No Rash; right foot dressing c/d/i Neuro: nonfocal MSK: in back brace, sitting in chair   Lab Results Lab Results  Component Value Date   WBC 11.8 (H) 08/31/2022   HGB 8.9 (L) 08/31/2022   HCT 27.4 (L) 08/31/2022   MCV 87.8 08/31/2022   PLT 266 08/31/2022    Lab Results  Component Value Date   CREATININE 1.09 08/31/2022   BUN 7 08/31/2022   NA 134 (L) 08/31/2022   K 4.2 08/31/2022   CL 103 08/31/2022   CO2 19 (L) 08/31/2022    Lab Results  Component  Value Date   ALT 13 08/21/2022   AST 11 (L) 08/21/2022   ALKPHOS 82 08/21/2022   BILITOT 0.6 08/21/2022      Microbiology: Recent Results (from the past 240 hour(s))  Aerobic/Anaerobic Culture w Gram Stain (surgical/deep wound)     Status: None (Preliminary result)   Collection Time: 08/30/22  4:31 PM   Specimen: Back; Wound  Result Value Ref Range Status   Specimen Description WOUND  Final   Special Requests THORACIC EIGHT TEST VANCO  Final   Gram Stain   Final    RARE WBC PRESENT,BOTH PMN AND MONONUCLEAR RARE GRAM POSITIVE COCCI IN PAIRS    Culture   Final    NO GROWTH < 12 HOURS Performed at [facility], [address], [place], [place] [address]    Report Status PENDING  Incomplete     Serology:   Imaging: If present, new imagings (plain films, ct scans, and mri) have  been personally visualized and interpreted; radiology reports have been reviewed. Decision making incorporated into the Impression / Recommendations.   5/20 mr thoracic spine 1. Findings of ongoing, progressive discitis-osteomyelitis at the T7-8 and T8-9 levels. Partial collapse of the T8 vertebral body with 40% vertebral body height loss and approximately 6 mm of bony retropulsion at the inferior endplate of T8. 2. Mild collapse of the superior endplate of T9 with approximately 25% vertebral body height loss. 3. Moderate canal stenosis at the T8-9 level secondary to retropulsed T8 vertebral body. There may be mild cord edema at the T8-9 level, evaluation is somewhat degraded by motion artifact on the axial sequences. 4. Thin ventral epidural phlegmon at the T7 to T9 level. 5. Extensive phlegmonous changes in the paravertebral soft tissues adjacent to the involved midthoracic levels. No organized epidural abscess. 6. Bilateral pleural effusions, complex on the right, better characterized by same day CT chest.    5/20 Ct angio chest 1. No filling defect is identified in the pulmonary  arterial tree to suggest pulmonary embolus. 2. Discitis-osteomyelitis at T7-T8-T9, with progressive bony destructive findings, including substantial collapse of T8 and 8 mm of posterior bony retropulsion narrowing the AP diameter of the thecal sac down to 0.4 cm, compatible with prominent central narrowing of the thecal sac. 3. Small left and small to moderate right pleural effusion with some loculation of the right pleural effusion along the major fissure. Passive atelectasis noted in addition to scattered mild atelectasis in the lungs. 4. Mild cardiomegaly. Faint mitral valve calcification. 5. Coronary, aortic arch, and branch vessel atherosclerotic vascular disease. 6. Chronic endplate compressions at T3, no change from 07/12/2022. 7. Aortic and coronary atherosclerosis.  Raymondo Band, MD [facility] [phone] pager    08/31/2022, 9:21 AM

## 2022-09-01 ENCOUNTER — Inpatient Hospital Stay (HOSPITAL_COMMUNITY): Payer: Medicaid Other

## 2022-09-01 DIAGNOSIS — I10 Essential (primary) hypertension: Secondary | ICD-10-CM | POA: Diagnosis not present

## 2022-09-01 DIAGNOSIS — M4644 Discitis, unspecified, thoracic region: Secondary | ICD-10-CM | POA: Diagnosis not present

## 2022-09-01 DIAGNOSIS — E119 Type 2 diabetes mellitus without complications: Secondary | ICD-10-CM | POA: Diagnosis not present

## 2022-09-01 DIAGNOSIS — E162 Hypoglycemia, unspecified: Secondary | ICD-10-CM | POA: Diagnosis not present

## 2022-09-01 LAB — GLUCOSE, CAPILLARY
Glucose-Capillary: 192 mg/dL — ABNORMAL HIGH (ref 70–99)
Glucose-Capillary: 180 mg/dL — ABNORMAL HIGH (ref 70–99)
Glucose-Capillary: 205 mg/dL — ABNORMAL HIGH (ref 70–99)
Glucose-Capillary: 239 mg/dL — ABNORMAL HIGH (ref 70–99)

## 2022-09-01 LAB — CK: Total CK: 142 U/L (ref 49–397)

## 2022-09-01 LAB — AEROBIC/ANAEROBIC CULTURE W GRAM STAIN (SURGICAL/DEEP WOUND)

## 2022-09-01 MED ORDER — CARVEDILOL 3.125 MG PO TABS
3.1250 mg | ORAL_TABLET | Freq: Two times a day (BID) | ORAL | Status: DC
  Administered 2022-09-02 – 2022-10-28 (×113): 3.125 mg via ORAL
  Filled 2022-09-01 (×114): qty 1

## 2022-09-01 MED ORDER — HYDROMORPHONE HCL 1 MG/ML IJ SOLN
1.0000 mg | Freq: Once | INTRAMUSCULAR | Status: AC
  Administered 2022-09-01: 1 mg via INTRAVENOUS
  Filled 2022-09-01: qty 1

## 2022-09-01 MED ORDER — SODIUM CHLORIDE 0.9% FLUSH
10.0000 mL | Freq: Two times a day (BID) | INTRAVENOUS | Status: DC
  Administered 2022-09-01 – 2022-09-18 (×31): 10 mL
  Administered 2022-09-19: 20 mL
  Administered 2022-09-19: 10 mL
  Administered 2022-09-20: 20 mL
  Administered 2022-09-20: 10 mL
  Administered 2022-09-21: 20 mL
  Administered 2022-09-21 – 2022-09-25 (×8): 10 mL
  Administered 2022-09-25: 20 mL
  Administered 2022-09-26 – 2022-10-14 (×29): 10 mL

## 2022-09-01 MED ORDER — LACTATED RINGERS IV BOLUS
1000.0000 mL | Freq: Once | INTRAVENOUS | Status: AC
  Administered 2022-09-01: 1000 mL via INTRAVENOUS

## 2022-09-01 MED ORDER — DOXYCYCLINE HYCLATE 100 MG PO TABS
100.0000 mg | ORAL_TABLET | Freq: Two times a day (BID) | ORAL | Status: DC
  Administered 2022-09-28 – 2022-10-28 (×61): 100 mg via ORAL
  Filled 2022-09-01 (×61): qty 1

## 2022-09-01 MED ORDER — FENTANYL CITRATE PF 50 MCG/ML IJ SOSY
25.0000 ug | PREFILLED_SYRINGE | Freq: Once | INTRAMUSCULAR | Status: AC
  Administered 2022-09-01: 25 ug via INTRAVENOUS
  Filled 2022-09-01: qty 1

## 2022-09-01 MED ORDER — SODIUM CHLORIDE 0.9% FLUSH: 10.0000 mL | INTRAVENOUS | Status: DC | PRN

## 2022-09-01 MED ORDER — SODIUM CHLORIDE 0.9 % IV BOLUS
250.0000 mL | Freq: Once | INTRAVENOUS | Status: AC
  Administered 2022-09-01: 250 mL via INTRAVENOUS

## 2022-09-01 NOTE — Progress Notes (Signed)
Subjective: No acute events overnight.   Objective: Vital signs in last 24 hours: Temp:  [97.8 F (36.6 C)-98.9 F (37.2 C)] 98 F (36.7 C) (05/30 0909) Pulse Rate:  [64-76] 66 (05/30 0909) Resp:  [18-20] 18 (05/30 0909) BP: (85-110)/(54-77) 92/67 (05/30 0909) SpO2:  [92 %-99 %] 95 % (05/30 0909)  Intake/Output from previous day: 05/29 0701 - 05/30 0700 In: 2450.4 [P.O.:1440; I.V.:540.4; IV Piggyback:470.1] Out: 820 [Urine:600; Drains:220] Intake/Output this shift: Total I/O In: -  Out: 40 [Drains:40]  Awake, alert, oriented Speech fluent, appropriate 5/5 BUE. 3/5 RLE, 2/5 LLE MAE FC x4 SILTx4 Wound c/d/i  Lab Results: Recent Labs    08/30/22 0312 08/30/22 1639 08/31/22 0734  WBC 6.7  --  11.8*  HGB 10.2* 8.5* 8.9*  HCT 31.1* 25.0* 27.4*  PLT 251  --  266   BMET Recent Labs    08/30/22 1639 08/31/22 0603  NA 136 134*  K 4.0 4.2  CL  --  103  CO2  --  19*  GLUCOSE  --  155*  BUN  --  7  CREATININE  --  1.09  CALCIUM  --  9.2    Studies/Results: DG CHEST PORT 1 VIEW  Result Date: 09/01/2022 CLINICAL DATA:  PICC line placement EXAM: PORTABLE CHEST 1 VIEW COMPARISON:  Chest x-ray dated May 18th 2024 FINDINGS: Interval placement of right arm PICC with tip in the lower right atrium. Unchanged perihilar predominant right lung consolidation. Small right pleural effusion, unchanged. Interval posterior fusion of the thoracic spine. No evidence of pneumothorax. IMPRESSION: 1. Interval placement of right arm PICC with tip in the lower right atrium. 2. Unchanged perihilar predominant right lung consolidation and small right pleural effusion. Electronically Signed   By: Allegra Lai M.D.   On: 09/01/2022 09:32   [place] EKG SITE RITE  Result Date: 08/31/2022 If Site Rite image not attached, placement could not be confirmed due to current cardiac rhythm.  DG Thoracic Spine 2 View  Result Date: 08/30/2022 CLINICAL DATA:  Thoracic fusion and partial corpectomy EXAM:  THORACIC SPINE 2 VIEWS COMPARISON:  MRI from 08/22/2022 FLUOROSCOPY TIME:  Radiation Exposure Index (as provided by the fluoroscopic device): 12.05 mGy If the device does not provide the exposure index: Fluoroscopy Time:  20 seconds Number of Acquired Images:  6 FINDINGS: Initial images demonstrate intraoperative localization in the lower thoracic spine. Surgical retractors were then placed. T8 compression fracture is again seen. Pedicle screws are noted extending from T5-T11 with posterior fixation. IMPRESSION: Thoracic decompression with fixation as described from T5-T11. Electronically Signed   By: Alcide Clever M.D.   On: 08/30/2022 21:50   DG O-ARM IMAGE ONLY/NO REPORT  Result Date: 08/30/2022 There is no Radiologist interpretation  for this exam.   Assessment/Plan: POD#2 s/p T5-T11 PSF/laminectomy T8-9 for osteomyelitis/discitis   LOS: 9 days   PT/OT as tolerated. Continue supportive care. Dc JP today.    Call w/ questions/concerns.    Marcas Bowsher CAYLIN Zayra Devito 09/01/2022, 10:44 AM

## 2022-09-01 NOTE — Progress Notes (Signed)
[name]                                                                               Lasean Bowlen, is a 62 y.o. 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 Chestine Spore on 4/25 persistent A-fib on Eliquis, DM-2, seizure disorder, HTN, HLD and chronic pain return to ED with hypoglycemia to 38.  Patient was hospitalized for disseminated MRSA bacteremia from 4/9-5/17, and discharged on p.o. doxycycline.  Patient was hypoglycemic to 38 when EMS arrived.  His hypoglycemia persisted despite intervention in ED and leading to his rehospitalization.  He also have significant back pain.  MRI thoracic spine concerning for progressive discitis/osteomyelitis at T7-T9, extensive phlegmonous change in the paravertebral soft tissues,  partial collapse of T8 vertebral body with 40% vertebral body height loss and approximately 6 mm bony retropulsion, moderate canal stenosis and mild cord edema.  Neurosurgery and ID consulted.  Patient was started on IV vancomycin.  Neurosurgery planning surgical intervention today, 5/28.  Palliative medicine helping with pain management.  Therapy recommended CIR     Assessment & Plan    Assessment and Plan:  Disseminated MRSA bacteremia with T7 to T9 discitis and osteomyelitis:  MRI concerning for progressive discitis/osteomyelitis.  See details above.  Patient was hospitalized for this from 4/9-5/17 and discharged on doxycycline.   Previously treated with daptomycin followed by vancomycin and oritavancin. -Neurosurgery following- underwent surgical intervention on 5/28.  ID and palliative care consulted and on board.  ID recommended IV vancomycin .  Currently on MS contin 30 mg  every 12 hours, Lyrica 50 mg BID, oxycodone 15 mg every 4 hours prn, Robaxin 750 mg , tylenol, lidoderm patch.  Bowel regimen on board.  Remove the JP drain.  Narcan as needed.    Right foot osteomyelitis s/p right fifth ray amputation on 4/30.  Continue with antibiotics and dressing changes as per orthopedics.  Dressing in place.    Chronic HFrEF with recovered EF:  Euvolemic on exam.  Creatinine back to baseline.     Hypomagnesemia:  Replaced. Repeat in am.   Aki From diuretics.  Resolved.    Hypertension:  BP parameters are optimal.  Continue with coreg 12.5 mg BID.  Prn IV labetalol.    Persistent atrial fibrillation  Rate controlled.  Eliquis is on hold.  Anti coagulation to be held for one week as per neurosurgery.    Moderate pleural effusion:  - monitor.    Hypokalemia Replaced.    Constipation:  Resolved.    Physical deconditioning  Therapy eval CIR.    Hypotension:  Suspect from poor oral intake, post op hypotension.  Resolved with IV fluids.   Acute blood loss anemia:  Hemoglobin around 10 at baseline, dropped to 8.5 , from blood loss from surgery.  Continue to monitor.  Transfuse to keep hemoglobin greater than 7.  Uncontrolled insulin dependent DM with hypoglycemia and hyperglycemia.  A1c is 9.1% CBG (last 3)  Recent Labs    08/31/22 2127 09/01/22 0619 09/01/22 1153  GLUCAP 192* 192* 180*   Continue with SSI.  Continue to monitor.       Estimated body mass index is 24.91 kg/m as calculated from the following:   Height as of this encounter: 6' (1.829 m).   Weight as of this encounter: 83.3 kg.  Code Status: full code.  DVT Prophylaxis:  SCD's Start: 08/30/22 1939   Level of Care: Level of care: Med-Surg Family Communication: none at bedside.   Disposition Plan:     Remains inpatient appropriate:  pending.   Procedures:  T5-6, T6-7, T7-8, T8-9, T9-10, T10-11 posterior/posterolateral arthrodesis  Consultants:    Neuro surgery.   Antimicrobials:   Anti-infectives (From admission, onward)    Start     Dose/Rate Route Frequency Ordered Stop   09/28/22 1000  doxycycline (VIBRA-TABS) tablet 100 mg        100 mg Oral Every 12 hours 09/01/22 1113     08/31/22 2000  DAPTOmycin (CUBICIN) 650 mg in sodium chloride 0.9 % IVPB        8 mg/kg  83.3 kg 126 mL/hr over 30 Minutes Intravenous Daily 08/31/22 1053 09/27/22 2359   08/30/22 2300  vancomycin (VANCOREADY) IVPB 750 mg/150 mL  Status:  Discontinued        750 mg 150 mL/hr over 60 Minutes Intravenous Every 12 hours 08/30/22 1150 08/31/22 1053   08/30/22 1712  vancomycin (VANCOCIN) powder  Status:  Discontinued          As needed 08/30/22 1714 08/30/22 1756   08/30/22 1142  ceFAZolin (ANCEF) 2-4 GM/100ML-% IVPB  Status:  Discontinued       Note to Pharmacy: [facility] [place]: cabinet override      08/30/22 1142 08/30/22 1300   08/30/22 0600  ceFAZolin (ANCEF) IVPB 2g/100 mL premix        2 g 200 mL/hr over 30 Minutes Intravenous On call to O.R. 08/29/22 1848 08/30/22 1401   08/27/22 1041  vancomycin (VANCOCIN) IVPB 1000 mg/200 mL premix  Status:  Discontinued        1,000 mg 200 mL/hr over 60 Minutes Intravenous Every 12 hours 08/27/22 0802 08/30/22 1150   08/26/22 2300  vancomycin (VANCOREADY) IVPB 1250 mg/250 mL  Status:  Discontinued        1,250 mg 166.7 mL/hr over 90 Minutes Intravenous Every 24 hours 08/26/22 1014 08/27/22 0802   08/23/22 2200  vancomycin (VANCOREADY) IVPB 750 mg/150 mL  Status:  Discontinued        750 mg 150 mL/hr over 60 Minutes Intravenous Every 12 hours 08/23/22 0938 08/26/22 1014   08/23/22 1030  vancomycin (VANCOREADY) IVPB 1500 mg/300 mL        1,500 mg 150 mL/hr over 120 Minutes Intravenous  Once 08/23/22 0938 08/23/22 1312   08/20/22 2200  doxycycline (VIBRA-TABS) tablet 100 mg  Status:  Discontinued        100 mg Oral 2 times daily 08/20/22 1737 08/23/22 0927        Medications  Scheduled Meds:   acetaminophen  1,000 mg Oral TID   atorvastatin  80 mg Oral Daily   busPIRone  15 mg Oral BID   carvedilol  3.125 mg Oral BID   Chlorhexidine Gluconate Cloth  6 each Topical Daily   digoxin  125 mcg Oral Daily   [START ON 09/28/2022] doxycycline  100 mg Oral Q12H   famotidine  20 mg Oral BID   feeding supplement  237 mL Oral BID BM   fentaNYL  1 patch Transdermal Q48H   fluticasone  1 spray Each Nare Daily   insulin aspart  0-5 Units Subcutaneous QHS   insulin aspart  0-9 Units Subcutaneous TID WC   lidocaine  1 patch Transdermal Q24H   magnesium oxide  400 mg Oral BID   methocarbamol  750 mg Oral QID   pantoprazole  40 mg Oral Daily   polyethylene glycol  17 g Oral BID   pregabalin  50 mg Oral TID   senna-docusate  1 tablet Oral BID   sodium chloride flush  3 mL Intravenous Q12H   Continuous Infusions:  sodium chloride Stopped (08/31/22 1631)   sodium chloride 250 mL/hr at 09/01/22 0516   DAPTOmycin (CUBICIN) 650 mg in sodium chloride 0.9 % IVPB Stopped (08/31/22 2109)   PRN Meds:.fentaNYL (SUBLIMAZE) injection, labetalol, ondansetron **OR** ondansetron (ZOFRAN) IV, mouth rinse, oxyCODONE, sodium chloride flush    Subjective:   Brook Anchondo was seen and examined today.  Hypotensive this morning. But asymptomatic.   Objective:   Vitals:   09/01/22 0002 09/01/22 0344 09/01/22 0909 09/01/22 1149  BP: (!) 85/63 (!) 87/55 92/67 106/65  Pulse: 64 65 66 74  Resp: 20 18 18 18   Temp: 97.8 F (36.6 C) 97.9 F (36.6 C) 98 F (36.7 C) 98.3 F (36.8 C)  TempSrc: Axillary Oral Oral Oral  SpO2: 93% 92% 95% 91%  Weight:      Height:        Intake/Output Summary (Last 24 hours) at 09/01/2022 1413 Last data filed at 09/01/2022 1251 Gross per 24 hour  Intake 1402.94 ml  Output 525 ml  Net 877.94 ml    Filed Weights   08/20/22 1821 08/23/22 2307  Weight: 77.8 kg 83.3 kg     Exam General exam: Appears calm and comfortable  Respiratory system: Clear to auscultation.  Respiratory effort normal. Cardiovascular system: S1 & S2 heard, RRR. No JVD,  Gastrointestinal system: Abdomen is nondistended, soft and nontender.  Central nervous system: Alert and oriented.  Extremities: no cyanosis.  Skin: No rashes,  Psychiatry: calm and comfortable.      Data Reviewed:  I have personally reviewed following labs and imaging studies   CBC Lab Results  Component Value Date   WBC 11.8 (H) 08/31/2022   RBC 3.12 (L) 08/31/2022   HGB 8.9 (L) 08/31/2022   HCT 27.4 (L) 08/31/2022   MCV 87.8 08/31/2022   MCH 28.5 08/31/2022   PLT 266 08/31/2022   MCHC 32.5 08/31/2022   RDW 15.9 (H) 08/31/2022   LYMPHSABS 0.7 08/20/2022   MONOABS 1.0 08/20/2022   EOSABS 0.0 08/20/2022   BASOSABS 0.1 08/20/2022     Last metabolic panel Lab Results  Component Value Date   NA 134 (L) 08/31/2022   K 4.2 08/31/2022   CL 103 08/31/2022   CO2 19 (L) 08/31/2022   BUN 7 08/31/2022   CREATININE 1.09 08/31/2022   GLUCOSE 155 (H) 08/31/2022   GFRNONAA >60 08/31/2022   GFRAA >60 08/30/2019   CALCIUM 9.2 08/31/2022   PHOS 4.0 08/31/2022   PROT 7.0 08/21/2022   ALBUMIN 2.7 (L) 08/31/2022   LABGLOB 2.4 01/08/2021   AGRATIO 1.7 01/08/2021   BILITOT 0.6 08/21/2022   ALKPHOS 82 08/21/2022   AST 11 (L) 08/21/2022   ALT 13 08/21/2022   ANIONGAP 12 08/31/2022  CBG (last 3)  Recent Labs    08/31/22 2127 09/01/22 0619 09/01/22 1153  GLUCAP 192* 192* 180*       Coagulation Profile: Recent Labs  Lab 08/29/22 2011  INR 1.2      Radiology Studies: DG CHEST PORT 1 VIEW  Result Date: 09/01/2022 CLINICAL DATA:  PICC line placement/positioning EXAM: PORTABLE CHEST 1 VIEW COMPARISON:  09/01/2022 at 9:09 a.m. FINDINGS: Right-sided PICC line tip: Upper right atrium. If cavoatrial junction placement is desired, retract 2.5 cm. Stable recent postoperative findings in the thoracic spine with tubing in the vicinity likely representing a drain. Borderline elevated right  hemidiaphragm with indistinct airspace opacity primarily in the right lower lobe and right perihilar region potentially from pneumonia or atelectasis. Hazy left perihilar interstitial accentuation. Indistinct right costophrenic angle, the left costophrenic angle appears unremarkable. IMPRESSION: 1. Right-sided PICC line tip: Upper right atrium. If cavoatrial junction placement is desired, retract 2.5 cm. 2. Indistinct airspace opacity in the right lower lobe and right perihilar region potentially from pneumonia or atelectasis. 3. Hazy left perihilar interstitial accentuation. 4. Indistinct right costophrenic angle, possibly from a small effusion. Electronically Signed   By: Gaylyn Rong M.D.   On: 09/01/2022 12:16   DG CHEST PORT 1 VIEW  Result Date: 09/01/2022 CLINICAL DATA:  PICC line placement EXAM: PORTABLE CHEST 1 VIEW COMPARISON:  Chest x-ray dated May 18th 2024 FINDINGS: Interval placement of right arm PICC with tip in the lower right atrium. Unchanged perihilar predominant right lung consolidation. Small right pleural effusion, unchanged. Interval posterior fusion of the thoracic spine. No evidence of pneumothorax. IMPRESSION: 1. Interval placement of right arm PICC with tip in the lower right atrium. 2. Unchanged perihilar predominant right lung consolidation and small right pleural effusion. Electronically Signed   By: Allegra Lai M.D.   On: 09/01/2022 09:32   [place] EKG SITE RITE  Result Date: 08/31/2022 If Site Rite image not attached, placement could not be confirmed due to current cardiac rhythm.  DG Thoracic Spine 2 View  Result Date: 08/30/2022 CLINICAL DATA:  Thoracic fusion and partial corpectomy EXAM: THORACIC SPINE 2 VIEWS COMPARISON:  MRI from 08/22/2022 FLUOROSCOPY TIME:  Radiation Exposure Index (as provided by the fluoroscopic device): 12.05 mGy If the device does not provide the exposure index: Fluoroscopy Time:  20 seconds Number of Acquired Images:  6 FINDINGS: Initial  images demonstrate intraoperative localization in the lower thoracic spine. Surgical retractors were then placed. T8 compression fracture is again seen. Pedicle screws are noted extending from T5-T11 with posterior fixation. IMPRESSION: Thoracic decompression with fixation as described from T5-T11. Electronically Signed   By: Alcide Clever M.D.   On: 08/30/2022 21:50   DG O-ARM IMAGE ONLY/NO REPORT  Result Date: 08/30/2022 There is no Radiologist interpretation  for this exam.      Kathlen Mody M.D. [name] 09/01/2022, 2:13 PM  Available via Epic secure chat 7am-7pm After 7 pm, please refer to night coverage provider listed on amion.

## 2022-09-01 NOTE — Progress Notes (Signed)
Physical Therapy Treatment Patient Details Name: David [name] MRN: [id number] DOB: 11-25-1960 Today's Date: 09/01/2022   History of Present Illness Pt is a 62 y.o. male who presented 08/20/22  with hypoglycemia. 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 5/28 surgical repair: T5-11 posterior/posterolateral arthrodesis  & Transpedicular decompression with partial corpectomy, left T8 & T8-9 laminectomy, bilateral facetectomy. 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 Pt appropriate for session later this afternoon and pre-medicated for pain. However, pt declining OOB mobility due to pain and having a "difficult day". Extensive education provided on importance of frequent and regular mobility to prevent deconditioning, muscle atrophy, and pain/stiffness following spinal procedure. Pt ultimately agreeable to bed level mobility to assist in changing bed linens and in demonstrating exercises he has been performing at bed level to prevent muscle atrophy. Pt more receptive to education as he felt how weak he had become with rolling in bed this session, needing maxA today. Pt agrees to get OOB tomorrow. Will continue to follow acutely. Per chart, pt may need a less intensive inpatient rehab stay, <3 hours/day.     Recommendations for follow up therapy are one component of a multi-disciplinary discharge planning process, led by the attending physician.  Recommendations may be updated based on patient status, additional functional criteria and  insurance authorization.  Follow Up Recommendations  Can patient physically be transported by private vehicle: No    Assistance Recommended at Discharge Frequent or constant Supervision/Assistance  Patient can return home with the following Assist for transportation;Two people to help with walking and/or transfers;A lot of help with bathing/dressing/bathroom;Assistance with cooking/housework;Direct supervision/assist for financial management;Direct supervision/assist for medications management   Equipment Recommendations  Other (comment) (TBA further)    Recommendations for Other Services       Precautions / Restrictions Precautions Precautions: Fall;Back Precaution Booklet Issued: No Precaution Comments: JP drain Required Braces or Orthoses: Spinal Brace Spinal Brace: Thoracolumbosacral orthotic;Applied in sitting position Other Brace: for use when ambulating Restrictions Weight Bearing Restrictions: Yes RLE Weight Bearing: Partial weight bearing RLE Partial Weight Bearing Percentage or Pounds: heel weight bearing Other Position/Activity Restrictions: with Darco shoe (appears to be lost - currently working on getting new one for pt)     Mobility  Bed Mobility Overal bed mobility: Needs Assistance Bed Mobility: Rolling Rolling: Max assist, +2 for safety/equipment         General bed mobility comments: Needs assistance to flex either leg and assist to reach contralateral bed rail to roll either direction for bed linen change due to sheets being saturated. MaxA to rotate hips this date either direction x1 rep with prolonged sidelying on R side for change of linens, modA to maintain sidelying position.    Transfers                   General transfer comment: Pt declined    Ambulation/Gait               General Gait Details: unable at this time   Stairs  Wheelchair Mobility    Modified Rankin (Stroke Patients Only)       Balance                                             Cognition Arousal/Alertness: Awake/alert Behavior During Therapy: Anxious Overall Cognitive Status: Impaired/Different from baseline Area of Impairment: Memory, Following commands, Attention, Safety/judgement, Awareness, Problem solving                   Current Attention Level: Sustained Memory: Decreased short-term memory, Decreased recall of precautions Following Commands: Follows one step commands with increased time Safety/Judgement: Decreased awareness of safety, Decreased awareness of deficits Awareness: Intellectual Problem Solving: Slow processing, Decreased initiation, Difficulty sequencing General Comments: Pt initially declining PT due to pain and having a "difficult day". Extensive education provided on importance of mobility to reduce stiffness after spinal surgery and reduce risk of muscle atrophy, but pt still declining OOB mobility but agreeable to bed level mobility for change of linens. Pt does not recall working with PT prior to surgery, asking "well why did we not do therapy before surgery?".        Exercises Other Exercises Other Exercises: pt demonstrated various exercises he has been reportedly doing throughout the day, like glut squeezes, SAQ, hip abd/add and internal/external rotation, elbow flexion bil supine in bed - but pt declining to perform further exercises than several reps of the ones listed to demonstrate to PT    General Comments General comments (skin integrity, edema, etc.): Extensive education provided on importance of frequent and regular mobility to prevent deconditioning, muscle atrophy, and pain/stiffness following spinal procedure. Pt more receptive as he felt how weak he had become with rolling in bed this session.      Pertinent Vitals/Pain Pain Assessment Pain Assessment: Faces Faces Pain Scale: Hurts even more Pain Location: back Pain Descriptors / Indicators:  Discomfort, Grimacing, Guarding Pain Intervention(s): Limited activity within patient's tolerance, Monitored during session, Repositioned    Home Living                          Prior Function            PT Goals (current goals can now be found in the care plan section) Acute Rehab PT Goals Patient Stated Goal: to improve and reduce pain PT Goal Formulation: With patient Time For Goal Achievement: 09/14/22 Potential to Achieve Goals: Good Progress towards PT goals: Not progressing toward goals - comment (limited by pain)    Frequency    Min 5X/week      PT Plan Discharge plan needs to be updated    Co-evaluation              AM-PAC PT "6 Clicks" Mobility   Outcome Measure  Help needed turning from your back to your side while in a flat bed without using bedrails?: A Lot Help needed moving from lying on your back to sitting on the side of a flat bed without using bedrails?: Total Help needed moving to and from a bed to a chair (including a wheelchair)?: Total Help needed standing up from a chair using your arms (e.g., wheelchair or bedside chair)?: Total Help needed to walk in hospital room?: Total Help needed climbing 3-5 steps with a railing? : Total 6 Click Score: 7    End  of Session   Activity Tolerance: Patient limited by pain Patient left: with call bell/phone within reach;in bed;with bed alarm set;with nursing/sitter in room Nurse Communication: Mobility status PT Visit Diagnosis: Difficulty in walking, not elsewhere classified (R26.2);Other abnormalities of gait and mobility (R26.89);Muscle weakness (generalized) (M62.81);Pain;Unsteadiness on feet (R26.81) Pain - Right/Left: Right Pain - part of body: Ankle and joints of foot (back)     Time: 1610-9604 PT Time Calculation (min) (ACUTE ONLY): 20 min  Charges:  $Therapeutic Activity: 8-22 mins                     Raymond Gurney, PT, DPT Acute Rehabilitation Services  Office:  [phone]    Jewel Baize 09/01/2022, 5:29 PM

## 2022-09-01 NOTE — Progress Notes (Signed)
Peripherally Inserted Central Catheter Placement  The IV Nurse has discussed with the patient and/or persons authorized to consent for the patient, the purpose of this procedure and the potential benefits and risks involved with this procedure.  The benefits include less needle sticks, lab draws from the catheter, and the patient may be discharged home with the catheter. Risks include, but not limited to, infection, bleeding, blood clot (thrombus formation), and puncture of an artery; nerve damage and irregular heartbeat and possibility to perform a PICC exchange if needed/ordered by physician.  Alternatives to this procedure were also discussed.  Bard Power PICC patient education guide, fact sheet on infection prevention and patient information card has been provided to patient /or left at bedside.    PICC Placement Documentation  PICC Single Lumen 09/01/22 Right Brachial 40 cm 0 cm (Active)  Indication for Insertion or Continuance of Line Prolonged intravenous therapies 09/01/22 0851  Exposed Catheter (cm) 0 cm 09/01/22 0851  Site Assessment Clean, Dry, Intact 09/01/22 0851  Line Status Flushed;Saline locked;Blood return noted 09/01/22 0851  Dressing Type Transparent;Securing device 09/01/22 0851  Dressing Status Antimicrobial disc in place 09/01/22 0851  Safety Lock Not Applicable 09/01/22 0851  Line Care Connections checked and tightened 09/01/22 0851  Line Adjustment (NICU/IV Team Only) No 09/01/22 0851  Dressing Intervention New dressing 09/01/22 0851  Dressing Change Due 09/08/22 09/01/22 0851       Vernona Rieger  Candler Ginsberg 09/01/2022, 8:52 AM

## 2022-09-01 NOTE — Progress Notes (Signed)
IP rehab admissions - I spoke with patient's wife by phone.  Wife cannot provide care and supervision after a potential CIR stay.  Wife plans to go back to work next week, does not know what shift she will work, and wife says if she does not work they will lose their residence.  Wife says patient will need a longer stay in rehab.  Wife needs for patient to be self sufficient when he returns home.  Patient currently requiring max assist + 2 for mobility and is not able to ambulate at this time.  Wife feels patient will need SNF placement to allow sufficient time for recovery.  A 2 week CIR stay will not get patient to a point that he can care for himself.  Agree patient likely will need SNF placement.  Call me for questions.  [phone]

## 2022-09-01 NOTE — Progress Notes (Signed)
   09/01/22 1140  Spiritual Encounters  Type of Visit Follow up  Care provided to: Patient  Reason for visit Routine spiritual support (F/U after Pt. surgery.)  Spiritual Framework  Presenting Themes Values and beliefs;Significant life change;Coping tools;Courage hope and growth;Community and relationships  Values/beliefs Pt. faith is a source of strength for the Pt.  Community/Connection Family  Strengths Pt. acknowledges his resiliency through his personal story.  Needs/Challenges/Barriers Pain control  Patient Stress Factors Financial concerns;Health changes;Major life changes  Goals  Self/Personal Goals Return to work and relieve the financial stress on his wife.  Interventions  Spiritual Care Interventions Made Established relationship of care and support;Compassionate presence;Reflective listening;Narrative/life review;Prayer  Intervention Outcomes  Outcomes Connection to spiritual care;Reduced anxiety;Awareness of support  Spiritual Care Plan  Spiritual Care Issues Still Outstanding Chaplain will continue to follow  Follow up plan  Partner with Palliative Medicine Team.   The Pt. shared his appreciation of continued spiritual care and chaplain presence before and after surgery. The chaplain recognizes the Pt. sense of hope through the Pt. talking about his coping skills. This chaplain will continue F/U spiritual care as needed.  Chaplain Stephanie Acre [phone]

## 2022-09-02 DIAGNOSIS — I4819 Other persistent atrial fibrillation: Secondary | ICD-10-CM | POA: Diagnosis not present

## 2022-09-02 DIAGNOSIS — I10 Essential (primary) hypertension: Secondary | ICD-10-CM | POA: Diagnosis not present

## 2022-09-02 DIAGNOSIS — E119 Type 2 diabetes mellitus without complications: Secondary | ICD-10-CM | POA: Diagnosis not present

## 2022-09-02 DIAGNOSIS — E162 Hypoglycemia, unspecified: Secondary | ICD-10-CM | POA: Diagnosis not present

## 2022-09-02 DIAGNOSIS — M4644 Discitis, unspecified, thoracic region: Secondary | ICD-10-CM | POA: Diagnosis not present

## 2022-09-02 DIAGNOSIS — M546 Pain in thoracic spine: Secondary | ICD-10-CM | POA: Diagnosis not present

## 2022-09-02 LAB — BASIC METABOLIC PANEL
Anion gap: 9 (ref 5–15)
Anion gap: 7 (ref 5–15)
BUN: 19 mg/dL (ref 6–20)
BUN: 21 mg/dL — ABNORMAL HIGH (ref 6–20)
CO2: 20 mmol/L — ABNORMAL LOW (ref 22–32)
CO2: 20 mmol/L — ABNORMAL LOW (ref 22–32)
Calcium: 8.7 mg/dL — ABNORMAL LOW (ref 8.9–10.3)
Calcium: 8.4 mg/dL — ABNORMAL LOW (ref 8.9–10.3)
Chloride: 94 mmol/L — ABNORMAL LOW (ref 98–111)
Chloride: 94 mmol/L — ABNORMAL LOW (ref 98–111)
Creatinine, Ser: 1.27 mg/dL — ABNORMAL HIGH (ref 0.61–1.24)
Creatinine, Ser: 1.23 mg/dL (ref 0.61–1.24)
GFR, Estimated: >60 mL/min (ref >60)
GFR, Estimated: >60 mL/min (ref >60)
Glucose, Bld: 259 mg/dL — ABNORMAL HIGH (ref 70–99)
Glucose, Bld: 223 mg/dL — ABNORMAL HIGH (ref 70–99)
Potassium: 3.9 mmol/L (ref 3.5–5.1)
Potassium: 3.9 mmol/L (ref 3.5–5.1)
Sodium: 123 mmol/L — ABNORMAL LOW (ref 135–145)
Sodium: 121 mmol/L — ABNORMAL LOW (ref 135–145)

## 2022-09-02 LAB — CBC WITH DIFFERENTIAL/PLATELET
Abs Immature Granulocytes: 0.04 10*3/uL (ref 0.00–0.07)
Basophils Absolute: 0 10*3/uL (ref 0.0–0.1)
Basophils Relative: 0 %
Eosinophils Absolute: 0.3 10*3/uL (ref 0.0–0.5)
Eosinophils Relative: 3 %
HCT: 21.3 % — ABNORMAL LOW (ref 39.0–52.0)
Hemoglobin: 7.1 g/dL — ABNORMAL LOW (ref 13.0–17.0)
Immature Granulocytes: 0 %
Lymphocytes Relative: 11 %
Lymphs Abs: 1 10*3/uL (ref 0.7–4.0)
MCH: 29.3 pg (ref 26.0–34.0)
MCHC: 33.3 g/dL (ref 30.0–36.0)
MCV: 88 fL (ref 80.0–100.0)
Monocytes Absolute: 1 10*3/uL (ref 0.1–1.0)
Monocytes Relative: 11 %
Neutro Abs: 6.8 10*3/uL (ref 1.7–7.7)
Neutrophils Relative %: 75 %
Platelets: 215 10*3/uL (ref 150–400)
RBC: 2.42 MIL/uL — ABNORMAL LOW (ref 4.22–5.81)
RDW: 16.2 % — ABNORMAL HIGH (ref 11.5–15.5)
WBC: 9 10*3/uL (ref 4.0–10.5)
nRBC: 0 % (ref 0.0–0.2)

## 2022-09-02 LAB — MAGNESIUM: Magnesium: 2 mg/dL (ref 1.7–2.4)

## 2022-09-02 LAB — PREPARE RBC (CROSSMATCH)

## 2022-09-02 LAB — BPAM RBC
ISSUE DATE / TIME: 202405311141
Unit Type and Rh: 9500

## 2022-09-02 LAB — GLUCOSE, CAPILLARY
Glucose-Capillary: 270 mg/dL — ABNORMAL HIGH (ref 70–99)
Glucose-Capillary: 209 mg/dL — ABNORMAL HIGH (ref 70–99)
Glucose-Capillary: 196 mg/dL — ABNORMAL HIGH (ref 70–99)
Glucose-Capillary: 218 mg/dL — ABNORMAL HIGH (ref 70–99)

## 2022-09-02 LAB — AEROBIC/ANAEROBIC CULTURE W GRAM STAIN (SURGICAL/DEEP WOUND)

## 2022-09-02 MED ORDER — BISACODYL 10 MG RE SUPP
10.0000 mg | Freq: Every day | RECTAL | Status: DC | PRN
  Administered 2022-09-03: 10 mg via RECTAL
  Filled 2022-09-02 (×3): qty 1

## 2022-09-02 MED ORDER — INSULIN GLARGINE-YFGN 100 UNIT/ML ~~LOC~~ SOLN
10.0000 [IU] | Freq: Every day | SUBCUTANEOUS | Status: DC
  Administered 2022-09-02 – 2022-09-04 (×3): 10 [IU] via SUBCUTANEOUS
  Filled 2022-09-02 (×3): qty 0.1

## 2022-09-02 MED ORDER — SODIUM CHLORIDE 0.9% IV SOLUTION: Freq: Once | INTRAVENOUS | Status: DC

## 2022-09-02 MED ORDER — SODIUM CHLORIDE 0.9 % IV SOLN: INTRAVENOUS | Status: DC

## 2022-09-02 NOTE — Progress Notes (Signed)
PT Cancellation Note  Patient Details Name: David [name] MRN: [id number] DOB: December 10, 1960   Cancelled Treatment:    Reason Eval/Treat Not Completed: Pain limiting ability to participate;Medical issues which prohibited therapy. Pt scheduled to receive 1 unit PRBC due to hgb trending down, currently 7.1. Attempted PT session prior to transfusion. Pt declining, stating bilat rib pain from excessive coughing. PT to continue efforts as pt tolerates.   Ilda Foil 09/02/2022, 11:32 AM

## 2022-09-02 NOTE — Progress Notes (Signed)
Subjective: NAE o/n.   Objective: Vital signs in last 24 hours: Temp:  [97.9 F (36.6 C)-98.8 F (37.1 C)] 98.8 F (37.1 C) (05/31 0804) Pulse Rate:  [61-91] 71 (05/31 0804) Resp:  [16-18] 16 (05/31 0804) BP: (88-116)/(50-72) 109/72 (05/31 0804) SpO2:  [91 %-100 %] 100 % (05/31 0804)  Intake/Output from previous day: 05/30 0701 - 05/31 0700 In: 1490 [P.O.:1490] Out: 1870 [Urine:1675; Drains:195] Intake/Output this shift: No intake/output data recorded.  Awake, alert, oriented Speech fluent, appropriate 5/5 BUE. 3/5 RLE, 2/5 LLE MAE FC x4 SILTx4 Wound c/d/i  Lab Results: Recent Labs    08/31/22 0734 09/02/22 0404  WBC 11.8* 9.0  HGB 8.9* 7.1*  HCT 27.4* 21.3*  PLT 266 215   BMET Recent Labs    08/31/22 0603 09/02/22 0404  NA 134* 123*  K 4.2 3.9  CL 103 94*  CO2 19* 20*  GLUCOSE 155* 259*  BUN 7 19  CREATININE 1.09 1.27*  CALCIUM 9.2 8.7*    Studies/Results: DG Chest Port 1 View  Result Date: 09/01/2022 CLINICAL DATA:  PICC placement EXAM: PORTABLE CHEST 1 VIEW COMPARISON:  09/01/2022 and older FINDINGS: Right-sided PICC in place with tip at the central SVC above the right atrium. Extensive hardware fixation along the thoracic spine. Enlarged heart with vascular congestion. Likely small right effusion. No pneumothorax. Patchy right lung base opacity stable. IMPRESSION: Right-sided PICC with the tip along the central SVC above the right atrium Electronically Signed   By: Karen Kays M.D.   On: 09/01/2022 15:51   DG CHEST PORT 1 VIEW  Result Date: 09/01/2022 CLINICAL DATA:  PICC line placement/positioning EXAM: PORTABLE CHEST 1 VIEW COMPARISON:  09/01/2022 at 9:09 a.m. FINDINGS: Right-sided PICC line tip: Upper right atrium. If cavoatrial junction placement is desired, retract 2.5 cm. Stable recent postoperative findings in the thoracic spine with tubing in the vicinity likely representing a drain. Borderline elevated right hemidiaphragm with indistinct  airspace opacity primarily in the right lower lobe and right perihilar region potentially from pneumonia or atelectasis. Hazy left perihilar interstitial accentuation. Indistinct right costophrenic angle, the left costophrenic angle appears unremarkable. IMPRESSION: 1. Right-sided PICC line tip: Upper right atrium. If cavoatrial junction placement is desired, retract 2.5 cm. 2. Indistinct airspace opacity in the right lower lobe and right perihilar region potentially from pneumonia or atelectasis. 3. Hazy left perihilar interstitial accentuation. 4. Indistinct right costophrenic angle, possibly from a small effusion. Electronically Signed   By: Gaylyn Rong M.D.   On: 09/01/2022 12:16   DG CHEST PORT 1 VIEW  Result Date: 09/01/2022 CLINICAL DATA:  PICC line placement EXAM: PORTABLE CHEST 1 VIEW COMPARISON:  Chest x-ray dated May 18th 2024 FINDINGS: Interval placement of right arm PICC with tip in the lower right atrium. Unchanged perihilar predominant right lung consolidation. Small right pleural effusion, unchanged. Interval posterior fusion of the thoracic spine. No evidence of pneumothorax. IMPRESSION: 1. Interval placement of right arm PICC with tip in the lower right atrium. 2. Unchanged perihilar predominant right lung consolidation and small right pleural effusion. Electronically Signed   By: Allegra Lai M.D.   On: 09/01/2022 09:32   [place] EKG SITE RITE  Result Date: 08/31/2022 If Site Rite image not attached, placement could not be confirmed due to current cardiac rhythm.   Assessment/Plan: POD#3 s/p T5-T11 PSF/laminectomy T8-9 for osteomyelitis/discitis   LOS: 10 days    PT/OT as tolerated. Continue supportive care. Dc JP today.    Call w/ questions/concerns.  David [name] 09/02/2022, 9:07 AM

## 2022-09-02 NOTE — Progress Notes (Signed)
Dressing change on right foot done with kerlix, wound is dry without any discharges, will have day team re-address dressing order for foot.

## 2022-09-02 NOTE — Progress Notes (Signed)
Palliative Medicine Progress Note   Patient Name: David [name]       Date: 09/02/2022 DOB: 21-Sep-1960  Age: 62 y.o. MRN#: [id number] Attending Physician: Kathlen Mody, MD Primary Care Physician: [name], MD Admit Date: 08/20/2022    HPI/Patient Profile: 62 y.o. male  with history of chronic CHF, persistent atrial fibrillation on anticoagulation, and T2DM. 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 He was discharged home 08/19/22 with po doxycycline and po pain medication (Oxycodone IR and Oxycontin). He was readmitted on 08/20/2022 with hypoglycemia.  Patient was hypoglycemic to 38 when EMS arrived.  He was also reporting significant back pain.  MRI thoracic spine concerning for progressive discitis/osteomyelitis at T7-T9. ID consulted and started IV vancomycin.  Neurosurgery consulted, and patient underwent T5-T11 posterior spinal fusion and laminectomy on 5/28.   Palliative Medicine was consulted for pain control and goals of care.    Subjective: Chart reviewed. Patient is POD day #3 s/p T5-T11 PSF/laminectomy.   Bedside visit. When I first enter the room, patient is initially moaning and stating he is in a lot of pain. His RN is also entering the room to give him prn oxycodone. I assist RN with repositioning patient in bed per his request.   Patient's acute delirium seems to have improved/resolved. However, he continues to express much frustration regarding his current medical situation and feels that many things are out of his control. He is emotional and tearful throughout our discussion. He also expresses  much concern regarding his financial situation and requests to speak with social work.   Although he perseverates on the specifics regarding management of his pain, patient ultimately states that his pain is relatively well-controlled on the current regimen.    Objective:  Physical Exam Vitals reviewed.  Constitutional:      General: He is not in acute distress.    Appearance: He is ill-appearing.  Pulmonary:     Effort: Pulmonary effort is normal.  Neurological:     Mental Status: He is alert and oriented to person, place, and time.     Motor: Weakness present.  Psychiatric:        Mood and Affect: Affect is tearful.  Vital Signs: BP 109/79   Pulse (!) 57   Temp 97.9 F (36.6 C) (Oral)   Resp 20   Ht 6' (1.829 m)   Wt 83.3 kg   SpO2 100%   BMI 24.91 kg/m  SpO2: SpO2: 100 % O2 Device: O2 Device: Room Air    Palliative Medicine Assessment & Plan   Assessment: Principal Problem:   Hypoglycemia Active Problems:   Essential hypertension   Insulin dependent type 2 diabetes mellitus (HCC)   Persistent atrial fibrillation (HCC)   Pure hypercholesterolemia   Cellulitis in diabetic foot (HCC)   Diskitis    Recommendations/Plan: Continue current pain regimen: Fentanyl Duragesic patch 25 mcg/hr every 48 hours Oxycodone IR 10 mg every 4 hours as needed Fentanyl 50 mcg IV every 2 hours as needed Robaxin 750 mg 4 times daily Lyrica 50 mg 3 times daily Continue bowel regimen PMT will continue to follow  Code Status: Full code   Prognosis:  Unable to determine  Discharge Planning: To Be Determined   Thank you for allowing the Palliative Medicine Team to assist in the care of this patient.   MDM - moderate   Merry Proud, NP   Please contact Palliative Medicine Team phone at [phone] for questions and concerns.  For individual providers, please see AMION.

## 2022-09-02 NOTE — Inpatient Diabetes Management (Addendum)
Inpatient Diabetes Program Recommendations  AACE/ADA: New Consensus Statement on Inpatient Glycemic Control (2015)  Target Ranges:  Prepandial:   less than 140 mg/dL      Peak postprandial:   less than 180 mg/dL (1-2 hours)      Critically ill patients:  140 - 180 mg/dL   Lab Results  Component Value Date   GLUCAP 270 (H) 09/02/2022   HGBA1C 9.1 (H) 07/13/2022    Review of Glycemic Control  Latest Reference Range & Units 09/01/22 11:53 09/01/22 16:11 09/01/22 21:24 09/02/22 06:15  Glucose-Capillary 70 - 99 mg/dL 161 (H) 096 (H) 045 (H) 270 (H)  (H): Data is abnormally high Diabetes history: Type 2 DM Outpatient Diabetes medications: Basaglar 40 units QD, Humalog 15 units TID Current orders for Inpatient glycemic control: Novolog 0-9 units TID & HS  Inpatient Diabetes Program Recommendations:    Consider adding Semglee 10 units QD.   Spoke with patient briefly in an effort to determine reason for initial hypoglycemia at time of readmission. Patient unable to determine, but feels it may have been from lack of oral intake post surgery.  Reviewed patient's current A1c of 9.1%. Explained what a A1c is and what it measures. Also reviewed goal A1c with patient, importance of good glucose control @ home, and blood sugar goals. Reviewed patho of DM, need for improved glycemic control, impact on healing and risk for infections, survival skills, interventions, vascular changes and commorbidities.  Discussed differences with outpatient dosing vs current insulin basal rate needs. Would anticipate need for decrease at discharge; patient informed and in agreement. Feels he will be better able to take care of his diabetes once his pain in under better control in the post op period.  No further questions at this time.    Thanks, Lujean Rave, MSN, RNC-OB Diabetes Coordinator [phone] (8a-5p)

## 2022-09-02 NOTE — Progress Notes (Signed)
[name]                                                                               David [name], is a 62 y.o. male, DOB - Aug 08, 1960, RUE:[id number] Admit date - 08/20/2022    Outpatient Primary MD for the patient is [name], MD  LOS - 10  days    Brief summary   62 year old M with PMH of disseminated MRSA bacteremia with T7-T9 discitis, right foot osteomyelitis s/p right fifth ray amputation on 4/30, PAD  s/p angioplasty by Dr. Chestine Spore on 4/25 persistent A-fib on Eliquis, DM-2, seizure disorder, HTN, HLD and chronic pain return to ED with hypoglycemia to 38.  Patient was hospitalized for disseminated MRSA bacteremia from 4/9-5/17, and discharged on p.o. doxycycline.  Patient was hypoglycemic to 38 when EMS arrived.  His hypoglycemia persisted despite intervention in ED and leading to his rehospitalization.  He also have significant back pain.  MRI thoracic spine concerning for progressive discitis/osteomyelitis at T7-T9, extensive phlegmonous change in the paravertebral soft tissues,  partial collapse of T8 vertebral body with 40% vertebral body height loss and approximately 6 mm bony retropulsion, moderate canal stenosis and mild cord edema.  Neurosurgery and ID consulted.  Patient was started on IV vancomycin.  Neurosurgery planning surgical intervention today, 5/28.  Palliative medicine helping with pain management.  Therapy recommended CIR, unfortunately, pt does not have caregiver support, hence SNF is now recommended.      Assessment & Plan    Assessment and Plan:  Disseminated MRSA bacteremia with T7 to T9 discitis and osteomyelitis:  MRI concerning for progressive discitis/osteomyelitis.  See details above.  Patient was hospitalized for this from 4/9-5/17 and discharged on doxycycline.   Previously treated with daptomycin followed by vancomycin and oritavancin. -Neurosurgery following- underwent surgical intervention on 5/28.  ID and palliative care  consulted and on board.  ID recommended daptomycin to start on 5/29 and oral doxycycline to start on 09/28/22.  Currently on MS contin 30 mg every 12 hours, Lyrica 50 mg BID, oxycodone 15 mg every 4 hours prn, Robaxin 750 mg , tylenol, lidoderm patch.  Bowel regimen on board.  Remove the JP drain as per neurosurgery.  Narcan as needed.    Right foot osteomyelitis s/p right fifth ray amputation on 4/30.  Continue with antibiotics and dressing changes as per orthopedics.  Dressing in place.    Chronic HFrEF with recovered EF:  Euvolemic on exam.  Creatinine back to baseline.     Hypomagnesemia:  Replaced.    Hypertension:  BP parameters are optimal.  Continue with coreg 12.5 mg BID.  Prn IV labetalol.    Persistent atrial fibrillation  Rate controlled.  Eliquis is on hold.  Anti coagulation to be held for one week as per neurosurgery.    Moderate pleural effusion:  - monitor.    Hypokalemia Replaced. Repeat level wnl.   Constipation:  Senna, miralax on board, no BM, ordered dulcolax prn.    Physical deconditioning  Therapy eval CIR.    Hypotension:  Suspect from poor oral intake, post op hypotension.  Resolved with IV fluids.   Acute blood loss anemia:  Hemoglobin  around 10 at baseline, dropped to 8.5 to 7.1 , from blood loss from surgery.  Continue to monitor.  1 unit of prbc transfusion ordered, recheck H&H in am.    Hyponatremia:  Suspect from poor oral intake.  Check BMP tonight.     AKI:  Suspect from hypotension, dehydration. Creatinine worsened from 1 to 1.27.  Start him on IV fluids and check renal parameters in am.    Uncontrolled insulin dependent DM with hypoglycemia and hyperglycemia.  A1c is 9.1% CBG (last 3)  Recent Labs    09/01/22 2124 09/02/22 0615 09/02/22 1222  GLUCAP 239* 270* 209*   Continue with SSI. Uncontrolled with hyperglycemia:  Add Semglee 10 units daily.  Continue to monitor.       Estimated body mass  index is 24.91 kg/m as calculated from the following:   Height as of this encounter: 6' (1.829 m).   Weight as of this encounter: 83.3 kg.  Code Status: full code.  DVT Prophylaxis:  SCD's Start: 08/30/22 1939   Level of Care: Level of care: Med-Surg Family Communication: none at bedside.   Disposition Plan:     Remains inpatient appropriate:  pending.   Procedures:  T5-6, T6-7, T7-8, T8-9, T9-10, T10-11 posterior/posterolateral arthrodesis  Consultants:   Neuro surgery.   Antimicrobials:   Anti-infectives (From admission, onward)    Start     Dose/Rate Route Frequency Ordered Stop   09/28/22 1000  doxycycline (VIBRA-TABS) tablet 100 mg        100 mg Oral Every 12 hours 09/01/22 1113     08/31/22 2000  DAPTOmycin (CUBICIN) 650 mg in sodium chloride 0.9 % IVPB        8 mg/kg  83.3 kg 126 mL/hr over 30 Minutes Intravenous Daily 08/31/22 1053 09/27/22 2359   08/30/22 2300  vancomycin (VANCOREADY) IVPB 750 mg/150 mL  Status:  Discontinued        750 mg 150 mL/hr over 60 Minutes Intravenous Every 12 hours 08/30/22 1150 08/31/22 1053   08/30/22 1712  vancomycin (VANCOCIN) powder  Status:  Discontinued          As needed 08/30/22 1714 08/30/22 1756   08/30/22 1142  ceFAZolin (ANCEF) 2-4 GM/100ML-% IVPB  Status:  Discontinued       Note to Pharmacy: [facility], [place]: cabinet override      08/30/22 1142 08/30/22 1300   08/30/22 0600  ceFAZolin (ANCEF) IVPB 2g/100 mL premix        2 g 200 mL/hr over 30 Minutes Intravenous On call to O.R. 08/29/22 1848 08/30/22 1401   08/27/22 1041  vancomycin (VANCOCIN) IVPB 1000 mg/200 mL premix  Status:  Discontinued        1,000 mg 200 mL/hr over 60 Minutes Intravenous Every 12 hours 08/27/22 0802 08/30/22 1150   08/26/22 2300  vancomycin (VANCOREADY) IVPB 1250 mg/250 mL  Status:  Discontinued        1,250 mg 166.7 mL/hr over 90 Minutes Intravenous Every 24 hours 08/26/22 1014 08/27/22 0802   08/23/22 2200  vancomycin (VANCOREADY) IVPB 750 mg/150  mL  Status:  Discontinued        750 mg 150 mL/hr over 60 Minutes Intravenous Every 12 hours 08/23/22 0938 08/26/22 1014   08/23/22 1030  vancomycin (VANCOREADY) IVPB 1500 mg/300 mL        1,500 mg 150 mL/hr over 120 Minutes Intravenous  Once 08/23/22 0938 08/23/22 1312   08/20/22 2200  doxycycline (VIBRA-TABS) tablet 100 mg  Status:  Discontinued        100 mg Oral 2 times daily 08/20/22 1737 08/23/22 0927        Medications  Scheduled Meds:  sodium chloride   Intravenous Once   acetaminophen  1,000 mg Oral TID   atorvastatin  80 mg Oral Daily   busPIRone  15 mg Oral BID   carvedilol  3.125 mg Oral BID   Chlorhexidine Gluconate Cloth  6 each Topical Daily   digoxin  125 mcg Oral Daily   [START ON 09/28/2022] doxycycline  100 mg Oral Q12H   famotidine  20 mg Oral BID   feeding supplement  237 mL Oral BID BM   fentaNYL  1 patch Transdermal Q48H   fluticasone  1 spray Each Nare Daily   insulin aspart  0-5 Units Subcutaneous QHS   insulin aspart  0-9 Units Subcutaneous TID WC   insulin glargine-yfgn  10 Units Subcutaneous Daily   lidocaine  1 patch Transdermal Q24H   magnesium oxide  400 mg Oral BID   methocarbamol  750 mg Oral QID   pantoprazole  40 mg Oral Daily   polyethylene glycol  17 g Oral BID   pregabalin  50 mg Oral TID   senna-docusate  1 tablet Oral BID   sodium chloride flush  10-40 mL Intracatheter Q12H   sodium chloride flush  3 mL Intravenous Q12H   Continuous Infusions:  sodium chloride Stopped (09/01/22 1621)   sodium chloride 75 mL/hr at 09/02/22 1127   DAPTOmycin (CUBICIN) 650 mg in sodium chloride 0.9 % IVPB 650 mg (09/02/22 1528)   PRN Meds:.bisacodyl, fentaNYL (SUBLIMAZE) injection, labetalol, ondansetron **OR** ondansetron (ZOFRAN) IV, mouth rinse, oxyCODONE, sodium chloride flush, sodium chloride flush    Subjective:   David [name] was seen and examined today.  Reports persistent pain. Refusing therapy evals today.   Objective:   Vitals:    09/02/22 1152 09/02/22 1209 09/02/22 1215 09/02/22 1500  BP: 115/71 101/60 104/69 109/75  Pulse: 62 68 74 62  Resp:  16 16 16   Temp: 98.1 F (36.7 C) 98.4 F (36.9 C)  98.3 F (36.8 C)  TempSrc: Oral Oral  Oral  SpO2: 94% 94% 98% 97%  Weight:      Height:        Intake/Output Summary (Last 24 hours) at 09/02/2022 1529 Last data filed at 09/02/2022 1500 Gross per 24 hour  Intake 1565 ml  Output 1465 ml  Net 100 ml    Filed Weights   08/20/22 1821 08/23/22 2307  Weight: 77.8 kg 83.3 kg     Exam General exam: Ill appearing gentleman, not in distress.  Respiratory system: Clear to auscultation. Respiratory effort normal. Cardiovascular system: S1 & S2 heard, RRR. No JVD,  Gastrointestinal system: Abdomen is nondistended, soft and nontender.  Central nervous system: Alert and oriented. Grossly non focal Extremities: no cyanosis. Skin: No rashes,  Psychiatry: anxious.       Data Reviewed:  I have personally reviewed following labs and imaging studies   CBC Lab Results  Component Value Date   WBC 9.0 09/02/2022   RBC 2.42 (L) 09/02/2022   HGB 7.1 (L) 09/02/2022   HCT 21.3 (L) 09/02/2022   MCV 88.0 09/02/2022   MCH 29.3 09/02/2022   PLT 215 09/02/2022   MCHC 33.3 09/02/2022   RDW 16.2 (H) 09/02/2022   LYMPHSABS 1.0 09/02/2022   MONOABS 1.0 09/02/2022   EOSABS 0.3 09/02/2022   BASOSABS 0.0 09/02/2022     Last  metabolic panel Lab Results  Component Value Date   NA 123 (L) 09/02/2022   K 3.9 09/02/2022   CL 94 (L) 09/02/2022   CO2 20 (L) 09/02/2022   BUN 19 09/02/2022   CREATININE 1.27 (H) 09/02/2022   GLUCOSE 259 (H) 09/02/2022   GFRNONAA >60 09/02/2022   GFRAA >60 08/30/2019   CALCIUM 8.7 (L) 09/02/2022   PHOS 4.0 08/31/2022   PROT 7.0 08/21/2022   ALBUMIN 2.7 (L) 08/31/2022   LABGLOB 2.4 01/08/2021   AGRATIO 1.7 01/08/2021   BILITOT 0.6 08/21/2022   ALKPHOS 82 08/21/2022   AST 11 (L) 08/21/2022   ALT 13 08/21/2022   ANIONGAP 9 09/02/2022     CBG (last 3)  Recent Labs    09/01/22 2124 09/02/22 0615 09/02/22 1222  GLUCAP 239* 270* 209*       Coagulation Profile: Recent Labs  Lab 08/29/22 2011  INR 1.2      Radiology Studies: DG Chest Port 1 View  Result Date: 09/01/2022 CLINICAL DATA:  PICC placement EXAM: PORTABLE CHEST 1 VIEW COMPARISON:  09/01/2022 and older FINDINGS: Right-sided PICC in place with tip at the central SVC above the right atrium. Extensive hardware fixation along the thoracic spine. Enlarged heart with vascular congestion. Likely small right effusion. No pneumothorax. Patchy right lung base opacity stable. IMPRESSION: Right-sided PICC with the tip along the central SVC above the right atrium Electronically Signed   By: Karen Kays M.D.   On: 09/01/2022 15:51   DG CHEST PORT 1 VIEW  Result Date: 09/01/2022 CLINICAL DATA:  PICC line placement/positioning EXAM: PORTABLE CHEST 1 VIEW COMPARISON:  09/01/2022 at 9:09 a.m. FINDINGS: Right-sided PICC line tip: Upper right atrium. If cavoatrial junction placement is desired, retract 2.5 cm. Stable recent postoperative findings in the thoracic spine with tubing in the vicinity likely representing a drain. Borderline elevated right hemidiaphragm with indistinct airspace opacity primarily in the right lower lobe and right perihilar region potentially from pneumonia or atelectasis. Hazy left perihilar interstitial accentuation. Indistinct right costophrenic angle, the left costophrenic angle appears unremarkable. IMPRESSION: 1. Right-sided PICC line tip: Upper right atrium. If cavoatrial junction placement is desired, retract 2.5 cm. 2. Indistinct airspace opacity in the right lower lobe and right perihilar region potentially from pneumonia or atelectasis. 3. Hazy left perihilar interstitial accentuation. 4. Indistinct right costophrenic angle, possibly from a small effusion. Electronically Signed   By: Gaylyn Rong M.D.   On: 09/01/2022 12:16   DG CHEST PORT  1 VIEW  Result Date: 09/01/2022 CLINICAL DATA:  PICC line placement EXAM: PORTABLE CHEST 1 VIEW COMPARISON:  Chest x-ray dated May 18th 2024 FINDINGS: Interval placement of right arm PICC with tip in the lower right atrium. Unchanged perihilar predominant right lung consolidation. Small right pleural effusion, unchanged. Interval posterior fusion of the thoracic spine. No evidence of pneumothorax. IMPRESSION: 1. Interval placement of right arm PICC with tip in the lower right atrium. 2. Unchanged perihilar predominant right lung consolidation and small right pleural effusion. Electronically Signed   By: Allegra Lai M.D.   On: 09/01/2022 09:32       Kathlen Mody M.D. [name] 09/02/2022, 3:29 PM  Available via Epic secure chat 7am-7pm After 7 pm, please refer to night coverage provider listed on amion.

## 2022-09-02 NOTE — Progress Notes (Signed)
Intermittent cathetherization done twice at 0000 and 0600, patient feels no urge to urinate only pain on abdomen.

## 2022-09-02 NOTE — Plan of Care (Signed)
  Problem: Education: Goal: Knowledge of General Education information will improve Description: Including pain rating scale, medication(s)/side effects and non-pharmacologic comfort measures Outcome: Progressing   Problem: Clinical Measurements: Goal: Respiratory complications will improve Outcome: Progressing Goal: Cardiovascular complication will be avoided Outcome: Progressing   Problem: Nutrition: Goal: Adequate nutrition will be maintained Outcome: Progressing   Problem: Elimination: Goal: Will not experience complications related to urinary retention Outcome: Progressing   Problem: Skin Integrity: Goal: Risk for impaired skin integrity will decrease Outcome: Progressing

## 2022-09-02 NOTE — Progress Notes (Signed)
Bladder scan showed 650 ml of urine, Foley inserted as ordered, will continue to monitor

## 2022-09-02 NOTE — TOC Progression Note (Signed)
Transition of Care [facility]) - Progression Note    Patient Details  Name: David [name] MRN: [id number] Date of Birth: 1961/02/06  Transition of Care [facility]) CM/SW Contact  Baldemar Lenis, [place] Phone Number: 09/02/2022, 11:52 AM  Clinical Narrative:   CSW noting that patient does not qualify for CIR due to lack of caregiver support, SNF is now recommended. Patient has no insurance coverage. CSW sent request to financial counseling to screen patient for Medicaid and Disability for possible LOG placement. CSW to follow.    Expected Discharge Plan: Skilled Nursing Facility Barriers to Discharge: Continued Medical Work up, Inadequate or no insurance  Expected Discharge Plan and Services   Discharge Planning Services: CM Consult Post Acute Care Choice: Home Health Living arrangements for the past 2 months: Hotel/Motel                                       Social Determinants of Health (SDOH) Interventions SDOH Screenings   Food Insecurity: Patient Unable To Answer (08/23/2022)  Housing: Patient Unable To Answer (08/23/2022)  Transportation Needs: Patient Unable To Answer (08/23/2022)  Utilities: Patient Unable To Answer (08/23/2022)  Depression (PHQ2-9): Low Risk  (11/12/2021)  Stress: Stress Concern Present (09/26/2017)  Tobacco Use: Medium Risk (08/30/2022)    Readmission Risk Interventions    09/06/2021   12:07 PM  Readmission Risk Prevention Plan  Post Dischage Appt Complete  Medication Screening Complete  Transportation Screening Complete

## 2022-09-02 NOTE — Plan of Care (Signed)
  Problem: Activity: Goal: Ability to return to baseline activity level will improve Outcome: Not Progressing   Problem: Elimination: Goal: Will not experience complications related to urinary retention Outcome: Not Progressing   Problem: Pain Managment: Goal: General experience of comfort will improve Outcome: Not Progressing

## 2022-09-02 NOTE — Progress Notes (Signed)
JP drain removed as ordered, patient tolerated well, gauze placed in the site, will continue to monitor

## 2022-09-03 ENCOUNTER — Inpatient Hospital Stay (HOSPITAL_COMMUNITY): Payer: Medicaid Other

## 2022-09-03 DIAGNOSIS — M4644 Discitis, unspecified, thoracic region: Secondary | ICD-10-CM | POA: Diagnosis not present

## 2022-09-03 DIAGNOSIS — I10 Essential (primary) hypertension: Secondary | ICD-10-CM | POA: Diagnosis not present

## 2022-09-03 DIAGNOSIS — E119 Type 2 diabetes mellitus without complications: Secondary | ICD-10-CM | POA: Diagnosis not present

## 2022-09-03 DIAGNOSIS — E162 Hypoglycemia, unspecified: Secondary | ICD-10-CM | POA: Diagnosis not present

## 2022-09-03 LAB — CBC WITH DIFFERENTIAL/PLATELET
Abs Immature Granulocytes: 0.06 10*3/uL (ref 0.00–0.07)
Basophils Absolute: 0 10*3/uL (ref 0.0–0.1)
Basophils Relative: 0 %
Eosinophils Absolute: 0.4 10*3/uL (ref 0.0–0.5)
Eosinophils Relative: 4 %
HCT: 24.5 % — ABNORMAL LOW (ref 39.0–52.0)
Hemoglobin: 8 g/dL — ABNORMAL LOW (ref 13.0–17.0)
Immature Granulocytes: 1 %
Lymphocytes Relative: 10 %
Lymphs Abs: 0.9 10*3/uL (ref 0.7–4.0)
MCH: 28.5 pg (ref 26.0–34.0)
MCHC: 32.7 g/dL (ref 30.0–36.0)
MCV: 87.2 fL (ref 80.0–100.0)
Monocytes Absolute: 1 10*3/uL (ref 0.1–1.0)
Monocytes Relative: 11 %
Neutro Abs: 6.7 10*3/uL (ref 1.7–7.7)
Neutrophils Relative %: 74 %
Platelets: 231 10*3/uL (ref 150–400)
RBC: 2.81 MIL/uL — ABNORMAL LOW (ref 4.22–5.81)
RDW: 16.2 % — ABNORMAL HIGH (ref 11.5–15.5)
WBC: 9.1 10*3/uL (ref 4.0–10.5)
nRBC: 0 % (ref 0.0–0.2)

## 2022-09-03 LAB — BASIC METABOLIC PANEL
Anion gap: 7 (ref 5–15)
BUN: 20 mg/dL (ref 6–20)
CO2: 21 mmol/L — ABNORMAL LOW (ref 22–32)
Calcium: 8.7 mg/dL — ABNORMAL LOW (ref 8.9–10.3)
Chloride: 98 mmol/L (ref 98–111)
Creatinine, Ser: 1.12 mg/dL (ref 0.61–1.24)
GFR, Estimated: >60 mL/min (ref >60)
Glucose, Bld: 209 mg/dL — ABNORMAL HIGH (ref 70–99)
Potassium: 4.2 mmol/L (ref 3.5–5.1)
Sodium: 126 mmol/L — ABNORMAL LOW (ref 135–145)

## 2022-09-03 LAB — GLUCOSE, CAPILLARY
Glucose-Capillary: 213 mg/dL — ABNORMAL HIGH (ref 70–99)
Glucose-Capillary: 197 mg/dL — ABNORMAL HIGH (ref 70–99)
Glucose-Capillary: 182 mg/dL — ABNORMAL HIGH (ref 70–99)
Glucose-Capillary: 206 mg/dL — ABNORMAL HIGH (ref 70–99)

## 2022-09-03 LAB — TYPE AND SCREEN
ABO/RH(D): O NEG
Antibody Screen: NEGATIVE
Unit division: 0

## 2022-09-03 LAB — SODIUM, URINE, RANDOM: Sodium, Ur: <10 mmol/L

## 2022-09-03 LAB — BPAM RBC: Blood Product Expiration Date: 202406132359

## 2022-09-03 LAB — OSMOLALITY, URINE: Osmolality, Ur: 143 mOsm/kg — ABNORMAL LOW (ref 300–900)

## 2022-09-03 LAB — AMMONIA: Ammonia: 38 umol/L — ABNORMAL HIGH (ref 9–35)

## 2022-09-03 MED ORDER — FLEET ENEMA 7-19 GM/118ML RE ENEM: 1.0000 | ENEMA | Freq: Every day | RECTAL | Status: DC | PRN

## 2022-09-03 MED ORDER — HALOPERIDOL LACTATE 5 MG/ML IJ SOLN
1.0000 mg | Freq: Four times a day (QID) | INTRAMUSCULAR | Status: DC | PRN
  Administered 2022-09-04 – 2022-09-05 (×2): 1 mg via INTRAVENOUS
  Filled 2022-09-03 (×2): qty 1

## 2022-09-03 NOTE — Progress Notes (Signed)
Patient looks confused, hallucinating, complaining of pain, looks anxious, will continue to monitor

## 2022-09-03 NOTE — Plan of Care (Signed)
°  Problem: Clinical Measurements: °Goal: Will remain free from infection °Outcome: Not Progressing °  °Problem: Activity: °Goal: Risk for activity intolerance will decrease °Outcome: Not Progressing °  °Problem: Nutrition: °Goal: Adequate nutrition will be maintained °Outcome: Not Progressing °  °Problem: Coping: °Goal: Level of anxiety will decrease °Outcome: Not Progressing °  °Problem: Pain Managment: °Goal: General experience of comfort will improve °Outcome: Not Progressing °  °

## 2022-09-03 NOTE — Progress Notes (Signed)
Pt back from CT, c/o severe pain in ribs from movement.

## 2022-09-03 NOTE — Progress Notes (Signed)
[name]                                                                               David [name], is a 62 y.o. 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 Chestine Spore on 4/25 persistent A-fib on Eliquis, DM-2, seizure disorder, HTN, HLD and chronic pain return to ED with hypoglycemia to 38.  Patient was hospitalized for disseminated MRSA bacteremia from 4/9-5/17, and discharged on p.o. doxycycline.  Patient was hypoglycemic to 38 when EMS arrived.  His hypoglycemia persisted despite intervention in ED and leading to his rehospitalization.  He also have significant back pain.  MRI thoracic spine concerning for progressive discitis/osteomyelitis at T7-T9, extensive phlegmonous change in the paravertebral soft tissues,  partial collapse of T8 vertebral body with 40% vertebral body height loss and approximately 6 mm bony retropulsion, moderate canal stenosis and mild cord edema.  Neurosurgery and ID consulted.  Patient was started on IV vancomycin.  Neurosurgery planning surgical intervention today, 5/28.  Palliative medicine helping with pain management.  Therapy recommended CIR, unfortunately, pt does not have caregiver support, hence SNF is now recommended.      Assessment & Plan    Assessment and Plan:  Disseminated MRSA bacteremia with T7 to T9 discitis and osteomyelitis:  MRI concerning for progressive discitis/osteomyelitis.  See details above.  Patient was hospitalized for this from 4/9-5/17 and discharged on doxycycline.   Previously treated with daptomycin followed by vancomycin and oritavancin. -Neurosurgery following- underwent surgical intervention on 5/28. JP drain removed.  ID and  palliative care consulted and on board.  ID recommended daptomycin to start on 5/29 and oral doxycycline to start on 09/28/22.  Currently on MS contin 30 mg every 12 hours, Lyrica 50 mg BID, oxycodone 15 mg every 4 hours prn, Robaxin 750 mg , tylenol, lidoderm patch.  Bowel regimen on board.  Narcan as needed.    Right foot osteomyelitis s/p right fifth ray amputation on 4/30.  Continue with antibiotics and dressing changes as per orthopedics.  Dressing in place.  Weight bearing as tolerated.  Patient refusing to work with pT for persistent pain.     Chronic HFrEF with recovered EF:  Euvolemic on exam.  Creatinine back to baseline.     Hypomagnesemia:  Replaced.    Hypertension:  BP parameters are optimal.  Continue with coreg 12.5 mg BID.  Prn IV labetalol.    Acute encephalopathy:  - suspect from fentanyl use,  - intermittent hallucinations.  - get ammonia level.  Get CT head without contrast for further evaluation.  - prn narcan.    Persistent atrial fibrillation  Rate controlled.  Eliquis is on hold.  Anti coagulation to be held for one week as per neurosurgery.    Moderate pleural effusion:  - monitor.    Hypokalemia Replaced. Repeat level wnl.   Constipation:  Senna, miralax on board, no BM, ordered dulcolax prn.    Physical deconditioning  Therapy eval CIR.    Hypotension:  Suspect from poor oral intake, post op hypotension.  Resolved with IV fluids.   Acute blood loss anemia:  Hemoglobin around 10 at baseline, dropped to 8.5 to 7.1 , from blood loss from surgery.  Continue to monitor.  S/p 1 unit of prbc transfusion, repeat hemoglobin is improved to 8.    Hyponatremia:  Unclear etiology, poor oral intake.  Does not appear to be fluid overloaded.  Get urine and serum osmo, tsh  and check am cortisol level.     AKI:  Suspect from hypotension, dehydration. Creatinine worsened from 1 to 1.27.  Start him on IV fluids and check renal  parameters in am.    Uncontrolled insulin dependent DM with hypoglycemia and hyperglycemia.  A1c is 9.1% CBG (last 3)  Recent Labs    09/02/22 2130 09/03/22 0627 09/03/22 1135  GLUCAP 218* 213* 197*   Continue with SSI. Uncontrolled with hyperglycemia:  Add Semglee 10 units daily.  Continue to monitor.       Estimated body mass index is 24.91 kg/m as calculated from the following:   Height as of this encounter: 6' (1.829 m).   Weight as of this encounter: 83.3 kg.  Code Status: full code.  DVT Prophylaxis:  SCD's Start: 08/30/22 1939   Level of Care: Level of care: Med-Surg Family Communication: none at bedside.   Disposition Plan:     Remains inpatient appropriate:  pending.   Procedures:  T5-6, T6-7, T7-8, T8-9, T9-10, T10-11 posterior/posterolateral arthrodesis  Consultants:   Neuro surgery.   Antimicrobials:   Anti-infectives (From admission, onward)    Start     Dose/Rate Route Frequency Ordered Stop   09/28/22 1000  doxycycline (VIBRA-TABS) tablet 100 mg        100 mg Oral Every 12 hours 09/01/22 1113     08/31/22 2000  DAPTOmycin (CUBICIN) 650 mg in sodium chloride 0.9 % IVPB        8 mg/kg  83.3 kg 126 mL/hr over 30 Minutes Intravenous Daily 08/31/22 1053 09/27/22 2359   08/30/22 2300  vancomycin (VANCOREADY) IVPB 750 mg/150 mL  Status:  Discontinued        750 mg 150 mL/hr over 60 Minutes Intravenous Every 12 hours 08/30/22 1150 08/31/22 1053   08/30/22 1712  vancomycin (VANCOCIN) powder  Status:  Discontinued          As needed 08/30/22 1714 08/30/22 1756   08/30/22 1142  ceFAZolin (ANCEF) 2-4 GM/100ML-% IVPB  Status:  Discontinued       Note to Pharmacy: [facility], [place]: cabinet override      08/30/22 1142 08/30/22 1300   08/30/22 0600  ceFAZolin (ANCEF) IVPB 2g/100 mL premix        2 g 200 mL/hr over 30 Minutes Intravenous On call to O.R. 08/29/22 1848 08/30/22 1401   08/27/22 1041  vancomycin (VANCOCIN) IVPB 1000 mg/200 mL premix  Status:   Discontinued        1,000 mg 200 mL/hr over 60 Minutes Intravenous Every 12 hours 08/27/22 0802 08/30/22 1150   08/26/22 2300  vancomycin (VANCOREADY) IVPB 1250 mg/250 mL  Status:  Discontinued        1,250 mg 166.7 mL/hr over 90 Minutes Intravenous Every 24 hours 08/26/22 1014 08/27/22 0802   08/23/22 2200  vancomycin (VANCOREADY) IVPB 750 mg/150 mL  Status:  Discontinued  750 mg 150 mL/hr over 60 Minutes Intravenous Every 12 hours 08/23/22 0938 08/26/22 1014   08/23/22 1030  vancomycin (VANCOREADY) IVPB 1500 mg/300 mL        1,500 mg 150 mL/hr over 120 Minutes Intravenous  Once 08/23/22 0938 08/23/22 1312   08/20/22 2200  doxycycline (VIBRA-TABS) tablet 100 mg  Status:  Discontinued        100 mg Oral 2 times daily 08/20/22 1737 08/23/22 0927        Medications  Scheduled Meds:  sodium chloride   Intravenous Once   acetaminophen  1,000 mg Oral TID   atorvastatin  80 mg Oral Daily   busPIRone  15 mg Oral BID   carvedilol  3.125 mg Oral BID   Chlorhexidine Gluconate Cloth  6 each Topical Daily   digoxin  125 mcg Oral Daily   [START ON 09/28/2022] doxycycline  100 mg Oral Q12H   famotidine  20 mg Oral BID   feeding supplement  237 mL Oral BID BM   fentaNYL  1 patch Transdermal Q48H   fluticasone  1 spray Each Nare Daily   insulin aspart  0-5 Units Subcutaneous QHS   insulin aspart  0-9 Units Subcutaneous TID WC   insulin glargine-yfgn  10 Units Subcutaneous Daily   lidocaine  1 patch Transdermal Q24H   magnesium oxide  400 mg Oral BID   methocarbamol  750 mg Oral QID   pantoprazole  40 mg Oral Daily   polyethylene glycol  17 g Oral BID   pregabalin  50 mg Oral TID   senna-docusate  1 tablet Oral BID   sodium chloride flush  10-40 mL Intracatheter Q12H   sodium chloride flush  3 mL Intravenous Q12H   Continuous Infusions:  sodium chloride Stopped (09/01/22 1621)   sodium chloride 75 mL/hr at 09/03/22 0331   DAPTOmycin (CUBICIN) 650 mg in sodium chloride 0.9 % IVPB  650 mg (09/03/22 1402)   PRN Meds:.bisacodyl, fentaNYL (SUBLIMAZE) injection, labetalol, ondansetron **OR** ondansetron (ZOFRAN) IV, mouth rinse, oxyCODONE, sodium chloride flush, sodium chloride flush, sodium phosphate    Subjective:   David [name] was seen and examined today.  Confused, hallucinations.   Objective:   Vitals:   09/03/22 0005 09/03/22 0409 09/03/22 0819 09/03/22 1136  BP: 120/74 122/76 121/71 101/74  Pulse: (!) 59 64 70 61  Resp: 18 16 16 18   Temp: 98.1 F (36.7 C) 98 F (36.7 C) 98 F (36.7 C) 97.9 F (36.6 C)  TempSrc: Axillary Axillary Oral Oral  SpO2: 99% 99% 99%   Weight:      Height:        Intake/Output Summary (Last 24 hours) at 09/03/2022 1542 Last data filed at 09/03/2022 0414 Gross per 24 hour  Intake 300 ml  Output 2040 ml  Net -1740 ml    Filed Weights   08/20/22 1821 08/23/22 2307  Weight: 77.8 kg 83.3 kg     Exam General exam: ill appearing gentleman,  Respiratory system: Clear to auscultation. Respiratory effort normal. Cardiovascular system: S1 & S2 heard, RRR. No JVD,  Gastrointestinal system: Abdomen is nondistended, soft and nontender.  Central nervous system: Alert but confused.  Extremities: no edema, foot bandaged.  Skin: No rashes,  Psychiatry: unable to assess due to confusion.       Data Reviewed:  I have personally reviewed following labs and imaging studies   CBC Lab Results  Component Value Date   WBC 9.1 09/03/2022   RBC 2.81 (L)  09/03/2022   HGB 8.0 (L) 09/03/2022   HCT 24.5 (L) 09/03/2022   MCV 87.2 09/03/2022   MCH 28.5 09/03/2022   PLT 231 09/03/2022   MCHC 32.7 09/03/2022   RDW 16.2 (H) 09/03/2022   LYMPHSABS 0.9 09/03/2022   MONOABS 1.0 09/03/2022   EOSABS 0.4 09/03/2022   BASOSABS 0.0 09/03/2022     Last metabolic panel Lab Results  Component Value Date   NA 126 (L) 09/03/2022   K 4.2 09/03/2022   CL 98 09/03/2022   CO2 21 (L) 09/03/2022   BUN 20 09/03/2022   CREATININE 1.12  09/03/2022   GLUCOSE 209 (H) 09/03/2022   GFRNONAA >60 09/03/2022   GFRAA >60 08/30/2019   CALCIUM 8.7 (L) 09/03/2022   PHOS 4.0 08/31/2022   PROT 7.0 08/21/2022   ALBUMIN 2.7 (L) 08/31/2022   LABGLOB 2.4 01/08/2021   AGRATIO 1.7 01/08/2021   BILITOT 0.6 08/21/2022   ALKPHOS 82 08/21/2022   AST 11 (L) 08/21/2022   ALT 13 08/21/2022   ANIONGAP 7 09/03/2022    CBG (last 3)  Recent Labs    09/02/22 2130 09/03/22 0627 09/03/22 1135  GLUCAP 218* 213* 197*       Coagulation Profile: Recent Labs  Lab 08/29/22 2011  INR 1.2      Radiology Studies: DG Chest Port 1 View  Result Date: 09/01/2022 CLINICAL DATA:  PICC placement EXAM: PORTABLE CHEST 1 VIEW COMPARISON:  09/01/2022 and older FINDINGS: Right-sided PICC in place with tip at the central SVC above the right atrium. Extensive hardware fixation along the thoracic spine. Enlarged heart with vascular congestion. Likely small right effusion. No pneumothorax. Patchy right lung base opacity stable. IMPRESSION: Right-sided PICC with the tip along the central SVC above the right atrium Electronically Signed   By: Karen Kays M.D.   On: 09/01/2022 15:51       Kathlen Mody M.D. [name] 09/03/2022, 3:42 PM  Available via Epic secure chat 7am-7pm After 7 pm, please refer to night coverage provider listed on amion.

## 2022-09-03 NOTE — Progress Notes (Signed)
  NEUROSURGERY PROGRESS NOTE   No issues overnight. Pt cont to c/o back pain.   EXAM:  BP 121/71 (BP Location: Left Arm)   Pulse 70   Temp 98 F (36.7 C) (Oral)   Resp 16   Ht 6' (1.829 m)   Wt 83.3 kg   SpO2 99%   BMI 24.91 kg/m   Awake, alert, oriented  Speech fluent, appropriate  CN grossly intact  5/5 BUE Diffusely 3/5 RLE, 2/5 LLE  IMPRESSION:  62 y.o. male POD# 4 s/p thoracic decompression/stabilization for osteodiscitis, neurologically stable  PLAN: - Cont supportive care with PT/OT - Cont abx per ID, currently dapto and doxy   Lisbeth Renshaw, MD [facility]

## 2022-09-03 NOTE — Progress Notes (Signed)
Subjective: 4 Days Post-Op Procedure(s) (LRB): THORACIC FIVE-THORACIC ELEVEN FUSION, THORACIC EIGHT TRANSPEDICULAR DECOMPRESSION AND PARTIAL CORPECTOMY with O-Arm (N/A) Application of O-Arm (N/A)  Patient reports pain as appropriately controlled. Denies any new numbness/tingling.   Objective:   VITALS:  Temp:  [97.8 F (36.6 C)-98.4 F (36.9 C)] 98 F (36.7 C) (06/01 0819) Pulse Rate:  [57-74] 70 (06/01 0819) Resp:  [16-20] 16 (06/01 0819) BP: (101-122)/(60-79) 121/71 (06/01 0819) SpO2:  [94 %-100 %] 99 % (06/01 0819)  Gen: AAOx3, NAD Comfortable at rest  Right Lower Extremity: Incision c/d/I ADF/APF/EHL 5/5 SILT throughout DP, PT 2+ to palp CR < 2s    LABS Recent Labs    09/02/22 0404 09/03/22 0421  HGB 7.1* 8.0*  WBC 9.0 9.1  PLT 215 231   Recent Labs    09/02/22 0404 09/02/22 1754  NA 123* 121*  K 3.9 3.9  CL 94* 94*  CO2 20* 20*  BUN 19 21*  CREATININE 1.27* 1.23  GLUCOSE 259* 223*   No results for input(s): "LABPT", "INR" in the last 72 hours.   Assessment/Plan: Right foot fifth ray revision amputation DOS 08/02/22  -stable on RNF under Medicine primary team -weightbearing as tolerated operative extremity, maximum elevation -daily wound monitoring by bedside nursing -DVT ppx: per primary team -Abx: per primary team -follow up as outpatient within 7-10 days for wound check  [name] 09/03/2022, 9:33 AM

## 2022-09-03 NOTE — Plan of Care (Signed)
  Problem: Clinical Measurements: Goal: Respiratory complications will improve Outcome: Progressing   Problem: Nutrition: Goal: Adequate nutrition will be maintained Outcome: Progressing   Problem: Elimination: Goal: Will not experience complications related to bowel motility Outcome: Progressing Goal: Will not experience complications related to urinary retention Outcome: Progressing   

## 2022-09-03 NOTE — Progress Notes (Signed)
Pt going off floor for head CT.

## 2022-09-03 NOTE — Progress Notes (Addendum)
Bedside report received. Pt lying in bed smiling, asking for pain medication. Pt previously medicated with Fentanyl 50 mcg @ 1848. Pt asked for pain level, states 10. Pt educated on pain level and states its 'more likely a 9'. Pt informed he had just received pain medication to which pt denies and insists RN is not giving him his pain medication. Pt is confused and hallucinating. Spoke with pt regarding MD awareness for confusion with hallucinations and MD request to space out pain medications currently. Informed pt of MRI and urine tests looking for other potential causes for confusion. Educated pt on side effects of narcotics/opioids of pain medication re: confusion and hallucinations. Pt admits he is getting more confused after 10 minutes of talking and sees 'a little white dog on the chair' and is unable to figure out how to plug his cell phone into charger. Pt states he  really 'loves the feeling and relief of pain' with the IV medication and doesn't want to give it up. States he understands why 'junkies like it on the streets'. Educated pt on dangers of dependence. Pt requesting list of times he can get pain medications and asked it be updated regularly. Pt becomes visibly anxious and begins crying asking for medication to help calm him. Pt states he can only do an MRI with anxiety and extra pain medication. Will notify MD for medication for anxiety prior to MRI.  Pt will have CT of head. Not MRI.

## 2022-09-04 DIAGNOSIS — E162 Hypoglycemia, unspecified: Secondary | ICD-10-CM | POA: Diagnosis not present

## 2022-09-04 DIAGNOSIS — I10 Essential (primary) hypertension: Secondary | ICD-10-CM | POA: Diagnosis not present

## 2022-09-04 DIAGNOSIS — M4644 Discitis, unspecified, thoracic region: Secondary | ICD-10-CM | POA: Diagnosis not present

## 2022-09-04 DIAGNOSIS — E119 Type 2 diabetes mellitus without complications: Secondary | ICD-10-CM | POA: Diagnosis not present

## 2022-09-04 LAB — BASIC METABOLIC PANEL
Anion gap: 8 (ref 5–15)
BUN: 17 mg/dL (ref 6–20)
CO2: 21 mmol/L — ABNORMAL LOW (ref 22–32)
Calcium: 9.1 mg/dL (ref 8.9–10.3)
Chloride: 99 mmol/L (ref 98–111)
Creatinine, Ser: 1.1 mg/dL (ref 0.61–1.24)
GFR, Estimated: >60 mL/min (ref >60)
Glucose, Bld: 263 mg/dL — ABNORMAL HIGH (ref 70–99)
Potassium: 4.3 mmol/L (ref 3.5–5.1)
Sodium: 128 mmol/L — ABNORMAL LOW (ref 135–145)

## 2022-09-04 LAB — AEROBIC/ANAEROBIC CULTURE W GRAM STAIN (SURGICAL/DEEP WOUND)

## 2022-09-04 LAB — GLUCOSE, CAPILLARY
Glucose-Capillary: 246 mg/dL — ABNORMAL HIGH (ref 70–99)
Glucose-Capillary: 245 mg/dL — ABNORMAL HIGH (ref 70–99)
Glucose-Capillary: 236 mg/dL — ABNORMAL HIGH (ref 70–99)
Glucose-Capillary: 204 mg/dL — ABNORMAL HIGH (ref 70–99)

## 2022-09-04 LAB — CBC WITH DIFFERENTIAL/PLATELET
Abs Immature Granulocytes: 0.06 10*3/uL (ref 0.00–0.07)
Basophils Absolute: 0 10*3/uL (ref 0.0–0.1)
Basophils Relative: 0 %
Eosinophils Absolute: 0.3 10*3/uL (ref 0.0–0.5)
Eosinophils Relative: 3 %
HCT: 24.9 % — ABNORMAL LOW (ref 39.0–52.0)
Hemoglobin: 8.2 g/dL — ABNORMAL LOW (ref 13.0–17.0)
Immature Granulocytes: 1 %
Lymphocytes Relative: 10 %
Lymphs Abs: 0.9 10*3/uL (ref 0.7–4.0)
MCH: 29 pg (ref 26.0–34.0)
MCHC: 32.9 g/dL (ref 30.0–36.0)
MCV: 88 fL (ref 80.0–100.0)
Monocytes Absolute: 1.2 10*3/uL — ABNORMAL HIGH (ref 0.1–1.0)
Monocytes Relative: 13 %
Neutro Abs: 6.8 10*3/uL (ref 1.7–7.7)
Neutrophils Relative %: 73 %
Platelets: 275 10*3/uL (ref 150–400)
RBC: 2.83 MIL/uL — ABNORMAL LOW (ref 4.22–5.81)
RDW: 16.8 % — ABNORMAL HIGH (ref 11.5–15.5)
WBC: 9.3 10*3/uL (ref 4.0–10.5)
nRBC: 0 % (ref 0.0–0.2)

## 2022-09-04 LAB — OSMOLALITY: Osmolality: 287 mOsm/kg (ref 275–295)

## 2022-09-04 LAB — TSH: TSH: 0.855 u[IU]/mL (ref 0.350–4.500)

## 2022-09-04 LAB — CORTISOL: Cortisol, Plasma: 12.1 ug/dL

## 2022-09-04 MED ORDER — INSULIN GLARGINE-YFGN 100 UNIT/ML ~~LOC~~ SOLN
15.0000 [IU] | Freq: Every day | SUBCUTANEOUS | Status: DC
  Administered 2022-09-05 – 2022-09-06 (×2): 15 [IU] via SUBCUTANEOUS
  Filled 2022-09-04 (×2): qty 0.15

## 2022-09-04 MED ORDER — FUROSEMIDE 20 MG PO TABS
20.0000 mg | ORAL_TABLET | Freq: Once | ORAL | Status: AC
  Administered 2022-09-04: 20 mg via ORAL
  Filled 2022-09-04: qty 1

## 2022-09-04 NOTE — Plan of Care (Signed)
  Problem: Clinical Measurements: Goal: Postoperative complications will be avoided or minimized Outcome: Progressing   Problem: Pain Management: Goal: Pain level will decrease Outcome: Progressing   Problem: Skin Integrity: Goal: Will show signs of wound healing Outcome: Progressing   

## 2022-09-04 NOTE — Progress Notes (Signed)
Patient ID: David [name], male   DOB: 1960/05/03, 62 y.o.   MRN: [id number] BP 133/81 (BP Location: Left Arm)   Pulse (!) 57   Temp 98.4 F (36.9 C) (Oral)   Resp 18   Ht 6' (1.829 m)   Wt 83.3 kg   SpO2 98%   BMI 24.91 kg/m  Was hallucinating due to narcotics. May need to back of fentanyl patch Head ct was normal Weakness in lower extremities Dressing dry and intact Continue abx

## 2022-09-04 NOTE — Plan of Care (Signed)
  Problem: Education: Goal: Knowledge of General Education information will improve Description: Including pain rating scale, medication(s)/side effects and non-pharmacologic comfort measures Outcome: Progressing   Problem: Clinical Measurements: Goal: Respiratory complications will improve Outcome: Progressing   Problem: Elimination: Goal: Will not experience complications related to bowel motility Outcome: Progressing   Problem: Pain Managment: Goal: General experience of comfort will improve Outcome: Progressing   Problem: Skin Integrity: Goal: Risk for impaired skin integrity will decrease Outcome: Progressing   

## 2022-09-04 NOTE — Progress Notes (Signed)
[name]                                                                               David [name], is a 62 y.o. 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 Chestine Spore on 4/25 persistent A-fib on Eliquis, DM-2, seizure disorder, HTN, HLD and chronic pain return to ED with hypoglycemia to 38.  Patient was hospitalized for disseminated MRSA bacteremia from 4/9-5/17, and discharged on p.o. doxycycline.  Patient was hypoglycemic to 38 when EMS arrived.  His hypoglycemia persisted despite intervention in ED and leading to his rehospitalization.  He also have significant back pain.  MRI thoracic spine concerning for progressive discitis/osteomyelitis at T7-T9, extensive phlegmonous change in the paravertebral soft tissues,  partial collapse of T8 vertebral body with 40% vertebral body height loss and approximately 6 mm bony retropulsion, moderate canal stenosis and mild cord edema.  Neurosurgery and ID consulted.  Patient was started on IV vancomycin.  Neurosurgery planning surgical intervention today, 5/28.  Palliative medicine helping with pain management.  Therapy recommended CIR, unfortunately, pt does not have caregiver support, hence SNF is now recommended.      Assessment & Plan    Assessment and Plan:  Disseminated MRSA bacteremia with T7 to T9 discitis and osteomyelitis:  MRI concerning for progressive discitis/osteomyelitis.  Patient was hospitalized for this from 4/9-5/17 and discharged on doxycycline.   Previously treated with daptomycin followed by vancomycin and oritavancin. -Neurosurgery following- underwent surgical intervention on 5/28. JP drain removed.  ID and palliative care  consulted and on board.  ID recommended daptomycin to start on 5/29 and oral doxycycline to start on 09/28/22.  Currently on MS contin 30 mg every 12 hours, Lyrica 50 mg BID, oxycodone 15 mg every 4 hours prn, Robaxin 750 mg , tylenol, lidoderm patch.  Bowel regimen on board. Had 2 BM yesterday.  Narcan as needed.    Right foot osteomyelitis s/p right fifth ray amputation on 4/30.  Continue with antibiotics .  Weight bearing as tolerated.  Patient refusing to work with PT, reports persistent pain.     Chronic HFrEF with recovered EF:  Euvolemic on exam.  Creatinine back to baseline.    Hypomagnesemia:  Replaced. Repeat level wnl.   Hypertension:  BP parameters are well controlled. Continue with coreg 12.5 mg BID.  Prn IV labetalol.    Acute encephalopathy:  - suspect from fentanyl use,  - intermittent hallucinations.  - ammonia level is 38.  - CT head without contrast does not show any acute findings.  - prn narcan.    Persistent atrial fibrillation  Rate controlled.  Eliquis is on hold.  Anti coagulation to be held for one week as per neurosurgery.    Moderate pleural effusion:  - monitor. He remains on RA without sob.    Hypokalemia Replaced. Repeat level wnl.  Constipation:  Resolved. 2 BM yesterday.    Physical deconditioning  Therapy eval CIR but pt does not have support at home.    Hypotension:  Resolved.   Acute blood loss anemia:  Hemoglobin around 10 at baseline, dropped to 8.5 to 7.1 , from blood loss from surgery.  Continue to monitor.  S/p 1 unit of prbc transfusion, repeat hemoglobin is improved to 8 and remained stable around 8.2   Hyponatremia:  Unclear etiology, poor oral intake. ? Fluid overload. Sodium improved from 123 to 128. Continue to monitor. SIADH ruled out.  Serum osmo is 287, urine sodium is less than 10, TSH wnl.  Serum cortisol is 12.     AKI:  Suspect from hypotension, dehydration. Creatinine worsened from 1 to  1.27.  Resolved.    Uncontrolled insulin dependent DM with hypoglycemia and hyperglycemia.  A1c is 9.1% CBG (last 3)  Recent Labs    09/03/22 1618 09/03/22 2204 09/04/22 0614  GLUCAP 182* 206* 246*   Continue with SSI. Uncontrolled with hyperglycemia:  Add Semglee 10 units daily. Increase Semglee to 15 units daily.  Continue to monitor.       Estimated body mass index is 24.91 kg/m as calculated from the following:   Height as of this encounter: 6' (1.829 m).   Weight as of this encounter: 83.3 kg.  Code Status: full code.  DVT Prophylaxis:  SCD's Start: 08/30/22 1939   Level of Care: Level of care: Med-Surg Family Communication: none at bedside.   Disposition Plan:     Remains inpatient appropriate:  pending.   Procedures:  T5-6, T6-7, T7-8, T8-9, T9-10, T10-11 posterior/posterolateral arthrodesis  Consultants:   Neuro surgery.   Antimicrobials:   Anti-infectives (From admission, onward)    Start     Dose/Rate Route Frequency Ordered Stop   09/28/22 1000  doxycycline (VIBRA-TABS) tablet 100 mg        100 mg Oral Every 12 hours 09/01/22 1113     08/31/22 2000  DAPTOmycin (CUBICIN) 650 mg in sodium chloride 0.9 % IVPB        8 mg/kg  83.3 kg 126 mL/hr over 30 Minutes Intravenous Daily 08/31/22 1053 09/27/22 2359   08/30/22 2300  vancomycin (VANCOREADY) IVPB 750 mg/150 mL  Status:  Discontinued        750 mg 150 mL/hr over 60 Minutes Intravenous Every 12 hours 08/30/22 1150 08/31/22 1053   08/30/22 1712  vancomycin (VANCOCIN) powder  Status:  Discontinued          As needed 08/30/22 1714 08/30/22 1756   08/30/22 1142  ceFAZolin (ANCEF) 2-4 GM/100ML-% IVPB  Status:  Discontinued       Note to Pharmacy: [facility], [place]: cabinet override      08/30/22 1142 08/30/22 1300   08/30/22 0600  ceFAZolin (ANCEF) IVPB 2g/100 mL premix        2 g 200 mL/hr over 30 Minutes Intravenous On call to O.R. 08/29/22 1848 08/30/22 1401   08/27/22 1041  vancomycin (VANCOCIN) IVPB  1000 mg/200 mL premix  Status:  Discontinued        1,000 mg 200 mL/hr over 60 Minutes Intravenous Every 12 hours 08/27/22 0802 08/30/22 1150   08/26/22 2300  vancomycin (VANCOREADY) IVPB 1250 mg/250 mL  Status:  Discontinued        1,250 mg 166.7 mL/hr over 90 Minutes Intravenous Every 24 hours 08/26/22 1014 08/27/22 0802   08/23/22 2200  vancomycin (VANCOREADY) IVPB 750 mg/150 mL  Status:  Discontinued        750 mg 150 mL/hr over 60 Minutes Intravenous Every 12 hours 08/23/22 0938 08/26/22 1014   08/23/22 1030  vancomycin (VANCOREADY) IVPB 1500 mg/300 mL        1,500 mg 150 mL/hr over 120 Minutes Intravenous  Once 08/23/22 0938 08/23/22 1312   08/20/22 2200  doxycycline (VIBRA-TABS) tablet 100 mg  Status:  Discontinued        100 mg Oral 2 times daily 08/20/22 1737 08/23/22 0927        Medications  Scheduled Meds:  sodium chloride   Intravenous Once   acetaminophen  1,000 mg Oral TID   atorvastatin  80 mg Oral Daily   busPIRone  15 mg Oral BID   carvedilol  3.125 mg Oral BID   Chlorhexidine Gluconate Cloth  6 each Topical Daily   digoxin  125 mcg Oral Daily   [START ON 09/28/2022] doxycycline  100 mg Oral Q12H   famotidine  20 mg Oral BID   feeding supplement  237 mL Oral BID BM   fentaNYL  1 patch Transdermal Q48H   fluticasone  1 spray Each Nare Daily   insulin aspart  0-5 Units Subcutaneous QHS   insulin aspart  0-9 Units Subcutaneous TID WC   insulin glargine-yfgn  10 Units Subcutaneous Daily   lidocaine  1 patch Transdermal Q24H   magnesium oxide  400 mg Oral BID   methocarbamol  750 mg Oral QID   pantoprazole  40 mg Oral Daily   polyethylene glycol  17 g Oral BID   pregabalin  50 mg Oral TID   senna-docusate  1 tablet Oral BID   sodium chloride flush  10-40 mL Intracatheter Q12H   sodium chloride flush  3 mL Intravenous Q12H   Continuous Infusions:  sodium chloride Stopped (09/01/22 1621)   DAPTOmycin (CUBICIN) 650 mg in sodium chloride 0.9 % IVPB Stopped  (09/03/22 1432)   PRN Meds:.bisacodyl, fentaNYL (SUBLIMAZE) injection, haloperidol lactate, labetalol, ondansetron **OR** ondansetron (ZOFRAN) IV, mouth rinse, oxyCODONE, sodium chloride flush, sodium chloride flush, sodium phosphate    Subjective:   David [name] was seen and examined today.  Little more alert and oriented to place and person. No hallucinations.   Objective:   Vitals:   09/03/22 2319 09/04/22 0323 09/04/22 0824 09/04/22 1132  BP: 133/73 127/82 130/82 133/81  Pulse: (!) 53 63 (!) 57   Resp: 18 18 18 18   Temp: 98.1 F (36.7 C) 98.8 F (37.1 C) 98.1 F (36.7 C) 98.4 F (36.9 C)  TempSrc: Oral  Oral Oral  SpO2: 99% 99% 100% 98%  Weight:      Height:        Intake/Output Summary (Last 24 hours) at 09/04/2022 1235 Last data filed at 09/04/2022 4098 Gross per 24 hour  Intake 303 ml  Output 2725 ml  Net -2422 ml    Filed Weights   08/20/22 1821 08/23/22 2307  Weight: 77.8 kg 83.3 kg     Exam General exam: Appears calm and comfortable  Respiratory system: diminished at bases.  Cardiovascular system: S1 & S2 heard, RRR.  Gastrointestinal system: Abdomen is nondistended, soft and nontender.  Central nervous system: Alert oriented to person. Still a little confused.  Extremities: no cyanosis.  Skin: No rashes,  Psychiatry: calm and comfortable.        Data Reviewed:  I have personally reviewed following labs and imaging studies   CBC Lab Results  Component Value Date  WBC 9.3 09/04/2022   RBC 2.83 (L) 09/04/2022   HGB 8.2 (L) 09/04/2022   HCT 24.9 (L) 09/04/2022   MCV 88.0 09/04/2022   MCH 29.0 09/04/2022   PLT 275 09/04/2022   MCHC 32.9 09/04/2022   RDW 16.8 (H) 09/04/2022   LYMPHSABS 0.9 09/04/2022   MONOABS 1.2 (H) 09/04/2022   EOSABS 0.3 09/04/2022   BASOSABS 0.0 09/04/2022     Last metabolic panel Lab Results  Component Value Date   NA 128 (L) 09/04/2022   K 4.3 09/04/2022   CL 99 09/04/2022   CO2 21 (L) 09/04/2022   BUN 17  09/04/2022   CREATININE 1.10 09/04/2022   GLUCOSE 263 (H) 09/04/2022   GFRNONAA >60 09/04/2022   GFRAA >60 08/30/2019   CALCIUM 9.1 09/04/2022   PHOS 4.0 08/31/2022   PROT 7.0 08/21/2022   ALBUMIN 2.7 (L) 08/31/2022   LABGLOB 2.4 01/08/2021   AGRATIO 1.7 01/08/2021   BILITOT 0.6 08/21/2022   ALKPHOS 82 08/21/2022   AST 11 (L) 08/21/2022   ALT 13 08/21/2022   ANIONGAP 8 09/04/2022    CBG (last 3)  Recent Labs    09/03/22 1618 09/03/22 2204 09/04/22 0614  GLUCAP 182* 206* 246*       Coagulation Profile: Recent Labs  Lab 08/29/22 2011  INR 1.2      Radiology Studies: CT HEAD WO CONTRAST ( )  Result Date: 09/03/2022 CLINICAL DATA:  Altered mental status EXAM: CT HEAD WITHOUT CONTRAST TECHNIQUE: Contiguous axial images were obtained from the base of the skull through the vertex without intravenous contrast. RADIATION DOSE REDUCTION: This exam was performed according to the departmental dose-optimization program which includes automated exposure control, adjustment of the mA and/or kV according to patient size and/or use of iterative reconstruction technique. COMPARISON:  08/29/2021 FINDINGS: Brain: No evidence of acute infarction, hemorrhage, mass, mass effect, or midline shift. No hydrocephalus or extra-axial fluid collection. Vascular: No hyperdense vessel. Atherosclerotic calcifications in the intracranial carotid and vertebral arteries. Skull: Negative for fracture or focal lesion. Sinuses/Orbits: No acute finding. Other: The mastoid air cells are well aerated. IMPRESSION: No acute intracranial process. Electronically Signed   By: Wiliam Ke M.D.   On: 09/03/2022 23:57       Kathlen Mody M.D. [name] 09/04/2022, 12:35 PM  Available via Epic secure chat 7am-7pm After 7 pm, please refer to night coverage provider listed on amion.

## 2022-09-04 NOTE — Progress Notes (Signed)
Patient alert but having confusion and hallucinations sometimes, (said someone was peeping from the door) states his pain is more controlled today, not asking for pain meds more often but patient requesting list of times he can get pain medications. Pt encouraged for iv pain medication for breakthrough pain not controlled by oxycodone, he verbalized understanding, patient stated pain level was 4,5, talked about fentanyl IV being hold/stopped concerning hallucination, patient agreeable.  He had a 2 large bowel moments

## 2022-09-04 NOTE — Plan of Care (Signed)
Problem: Education: Goal: Understanding of CV disease, CV risk reduction, and recovery process will improve Outcome: Progressing Goal: Individualized Educational Video(s) Outcome: Progressing   Problem: Activity: Goal: Ability to return to baseline activity level will improve Outcome: Progressing   Problem: Cardiovascular: Goal: Ability to achieve and maintain adequate cardiovascular perfusion will improve Outcome: Progressing Goal: Vascular access site(s) Level 0-1 will be maintained Outcome: Progressing   Problem: Health Behavior/Discharge Planning: Goal: Ability to safely manage health-related needs after discharge will improve Outcome: Progressing   Problem: Education: Goal: Knowledge of General Education information will improve Description: Including pain rating scale, medication(s)/side effects and non-pharmacologic comfort measures Outcome: Progressing   Problem: Health Behavior/Discharge Planning: Goal: Ability to manage health-related needs will improve Outcome: Progressing   Problem: Clinical Measurements: Goal: Ability to maintain clinical measurements within normal limits will improve Outcome: Progressing Goal: Will remain free from infection Outcome: Progressing Goal: Diagnostic test results will improve Outcome: Progressing Goal: Respiratory complications will improve Outcome: Progressing Goal: Cardiovascular complication will be avoided Outcome: Progressing   Problem: Activity: Goal: Risk for activity intolerance will decrease Outcome: Progressing   Problem: Nutrition: Goal: Adequate nutrition will be maintained Outcome: Progressing   Problem: Coping: Goal: Level of anxiety will decrease Outcome: Progressing   Problem: Elimination: Goal: Will not experience complications related to bowel motility Outcome: Progressing Goal: Will not experience complications related to urinary retention Outcome: Progressing   Problem: Pain Managment: Goal:  General experience of comfort will improve Outcome: Progressing   Problem: Safety: Goal: Ability to remain free from injury will improve Outcome: Progressing   Problem: Skin Integrity: Goal: Risk for impaired skin integrity will decrease Outcome: Progressing   Problem: Education: Goal: Ability to describe self-care measures that may prevent or decrease complications (Diabetes Survival Skills Education) will improve Outcome: Progressing Goal: Individualized Educational Video(s) Outcome: Progressing   Problem: Coping: Goal: Ability to adjust to condition or change in health will improve Outcome: Progressing   Problem: Fluid Volume: Goal: Ability to maintain a balanced intake and output will improve Outcome: Progressing   Problem: Health Behavior/Discharge Planning: Goal: Ability to identify and utilize available resources and services will improve Outcome: Progressing Goal: Ability to manage health-related needs will improve Outcome: Progressing   Problem: Metabolic: Goal: Ability to maintain appropriate glucose levels will improve Outcome: Progressing   Problem: Nutritional: Goal: Maintenance of adequate nutrition will improve Outcome: Progressing Goal: Progress toward achieving an optimal weight will improve Outcome: Progressing   Problem: Skin Integrity: Goal: Risk for impaired skin integrity will decrease Outcome: Progressing   Problem: Tissue Perfusion: Goal: Adequacy of tissue perfusion will improve Outcome: Progressing   Problem: Education: Goal: Ability to verbalize activity precautions or restrictions will improve Outcome: Progressing Goal: Knowledge of the prescribed therapeutic regimen will improve Outcome: Progressing Goal: Understanding of discharge needs will improve Outcome: Progressing   Problem: Activity: Goal: Ability to avoid complications of mobility impairment will improve Outcome: Progressing Goal: Ability to tolerate increased activity  will improve Outcome: Progressing Goal: Will remain free from falls Outcome: Progressing   Problem: Bowel/Gastric: Goal: Gastrointestinal status for postoperative course will improve Outcome: Progressing   Problem: Clinical Measurements: Goal: Ability to maintain clinical measurements within normal limits will improve Outcome: Progressing Goal: Postoperative complications will be avoided or minimized Outcome: Progressing Goal: Diagnostic test results will improve Outcome: Progressing   Problem: Pain Management: Goal: Pain level will decrease Outcome: Progressing   Problem: Skin Integrity: Goal: Will show signs of wound healing Outcome: Progressing   Problem: Health Behavior/Discharge  Planning: Goal: Identification of resources available to assist in meeting health care needs will improve Outcome: Progressing   Problem: Bladder/Genitourinary: Goal: Urinary functional status for postoperative course will improve Outcome: Progressing

## 2022-09-04 NOTE — Progress Notes (Signed)
Pt has written list of pain medications when administered and when available on his clipboard. Spent 1 hour providing education on pain relief including medication, POC re: therapies, respiratory complications including incentive spirometry and its importance as pt c/o feeling like he can't 'take a deep breath'. Pt thoroughly educated on IS, able to pull 1000 ml max. Pt states he is afraid of pain and will medicate to prevent any possible pain. Discussed pain medication schedule and using pain medication safely and appropriately. Pt encouraged to reserve use of iv pain medication for severe breakthrough pain not controlled by oxycodone. Pt verbalizes understanding.  This RN has been to patient's room numerous times throughout the night to educate pt on pain relief and assistance requests. Pt states he is unable to perform simple tasks. When firmly encouraged pt is able to pull himself up in bed, place his blanket over his body and lift his legs, pour his own water.. Pt educated on deconditioning and its detriment to his health. Pt requires frequent emotional support, pt remains confused but states he hasn't had any hallucinations 'in awhile' and feels he is 'clearing up'.

## 2022-09-05 ENCOUNTER — Inpatient Hospital Stay (HOSPITAL_COMMUNITY): Payer: Medicaid Other

## 2022-09-05 ENCOUNTER — Encounter (HOSPITAL_COMMUNITY): Payer: Self-pay | Admitting: Neurosurgery

## 2022-09-05 DIAGNOSIS — M4644 Discitis, unspecified, thoracic region: Secondary | ICD-10-CM | POA: Diagnosis not present

## 2022-09-05 DIAGNOSIS — E119 Type 2 diabetes mellitus without complications: Secondary | ICD-10-CM | POA: Diagnosis not present

## 2022-09-05 DIAGNOSIS — I10 Essential (primary) hypertension: Secondary | ICD-10-CM | POA: Diagnosis not present

## 2022-09-05 DIAGNOSIS — E162 Hypoglycemia, unspecified: Secondary | ICD-10-CM | POA: Diagnosis not present

## 2022-09-05 LAB — BASIC METABOLIC PANEL
Anion gap: 8 (ref 5–15)
BUN: 13 mg/dL (ref 6–20)
CO2: 23 mmol/L (ref 22–32)
Calcium: 9.2 mg/dL (ref 8.9–10.3)
Chloride: 101 mmol/L (ref 98–111)
Creatinine, Ser: 0.94 mg/dL (ref 0.61–1.24)
GFR, Estimated: >60 mL/min (ref >60)
Glucose, Bld: 218 mg/dL — ABNORMAL HIGH (ref 70–99)
Potassium: 4 mmol/L (ref 3.5–5.1)
Sodium: 132 mmol/L — ABNORMAL LOW (ref 135–145)

## 2022-09-05 LAB — URINALYSIS, ROUTINE W REFLEX MICROSCOPIC
Bacteria, UA: NONE SEEN
Glucose, UA: NEGATIVE mg/dL
Hgb urine dipstick: NEGATIVE
Ketones, ur: NEGATIVE mg/dL
Nitrite: NEGATIVE
Protein, ur: 100 mg/dL — AB
Specific Gravity, Urine: 1.02 (ref 1.005–1.030)
pH: 5 (ref 5.0–8.0)

## 2022-09-05 LAB — GLUCOSE, CAPILLARY
Glucose-Capillary: 195 mg/dL — ABNORMAL HIGH (ref 70–99)
Glucose-Capillary: 250 mg/dL — ABNORMAL HIGH (ref 70–99)
Glucose-Capillary: 228 mg/dL — ABNORMAL HIGH (ref 70–99)
Glucose-Capillary: 288 mg/dL — ABNORMAL HIGH (ref 70–99)

## 2022-09-05 LAB — DIGOXIN LEVEL: Digoxin Level: 1.1 ng/mL (ref 0.8–2.0)

## 2022-09-05 LAB — AMMONIA: Ammonia: 21 umol/L (ref 9–35)

## 2022-09-05 MED ORDER — IOHEXOL 350 MG/ML SOLN
50.0000 mL | Freq: Once | INTRAVENOUS | Status: AC | PRN
  Administered 2022-09-05: 50 mL via INTRAVENOUS

## 2022-09-05 MED ORDER — FENTANYL CITRATE PF 50 MCG/ML IJ SOSY
25.0000 ug | PREFILLED_SYRINGE | INTRAMUSCULAR | Status: DC | PRN
  Administered 2022-09-09 – 2022-09-19 (×37): 25 ug via INTRAVENOUS
  Filled 2022-09-05 (×40): qty 1

## 2022-09-05 MED ORDER — KETOROLAC TROMETHAMINE 15 MG/ML IJ SOLN
15.0000 mg | INTRAMUSCULAR | Status: AC
  Administered 2022-09-05: 15 mg via INTRAVENOUS
  Filled 2022-09-05: qty 1

## 2022-09-05 MED ORDER — METHOCARBAMOL 750 MG PO TABS
750.0000 mg | ORAL_TABLET | Freq: Four times a day (QID) | ORAL | Status: DC | PRN
  Administered 2022-09-06 – 2022-10-28 (×125): 750 mg via ORAL
  Filled 2022-09-05 (×129): qty 1

## 2022-09-05 MED ORDER — FENTANYL 50 MCG/HR TD PT72
1.0000 | MEDICATED_PATCH | TRANSDERMAL | Status: DC
  Administered 2022-09-05 – 2022-09-27 (×12): 1 via TRANSDERMAL
  Filled 2022-09-05 (×15): qty 1

## 2022-09-05 MED ORDER — OXYMETAZOLINE HCL 0.05 % NA SOLN
1.0000 | Freq: Two times a day (BID) | NASAL | Status: AC
  Administered 2022-09-05 – 2022-09-08 (×6): 1 via NASAL
  Filled 2022-09-05: qty 30

## 2022-09-05 MED ORDER — CELECOXIB 100 MG PO CAPS
100.0000 mg | ORAL_CAPSULE | Freq: Two times a day (BID) | ORAL | Status: DC
  Administered 2022-09-05 – 2022-10-28 (×106): 100 mg via ORAL
  Filled 2022-09-05 (×108): qty 1

## 2022-09-05 MED ORDER — FENTANYL CITRATE PF 50 MCG/ML IJ SOSY
25.0000 ug | PREFILLED_SYRINGE | INTRAMUSCULAR | Status: AC
  Administered 2022-09-05: 25 ug via INTRAVENOUS
  Filled 2022-09-05: qty 1

## 2022-09-05 MED ORDER — DULOXETINE HCL 20 MG PO CPEP
20.0000 mg | ORAL_CAPSULE | Freq: Every day | ORAL | Status: DC
  Administered 2022-09-06 – 2022-10-28 (×53): 20 mg via ORAL
  Filled 2022-09-05 (×53): qty 1

## 2022-09-05 MED ORDER — PREGABALIN 75 MG PO CAPS
75.0000 mg | ORAL_CAPSULE | Freq: Every day | ORAL | Status: DC
  Administered 2022-09-06 – 2022-10-27 (×52): 75 mg via ORAL
  Filled 2022-09-05 (×25): qty 1
  Filled 2022-09-05: qty 3
  Filled 2022-09-05 (×27): qty 1

## 2022-09-05 NOTE — Progress Notes (Signed)
Patient refuses vitals at this time.

## 2022-09-05 NOTE — Plan of Care (Signed)
  Problem: Education: Goal: Knowledge of General Education information will improve Description: Including pain rating scale, medication(s)/side effects and non-pharmacologic comfort measures Outcome: Progressing   Problem: Clinical Measurements: Goal: Respiratory complications will improve Outcome: Progressing Goal: Cardiovascular complication will be avoided Outcome: Progressing   Problem: Activity: Goal: Risk for activity intolerance will decrease Outcome: Progressing   Problem: Nutrition: Goal: Adequate nutrition will be maintained Outcome: Progressing   Problem: Coping: Goal: Level of anxiety will decrease Outcome: Progressing   Problem: Elimination: Goal: Will not experience complications related to bowel motility Outcome: Progressing   Problem: Pain Managment: Goal: General experience of comfort will improve Outcome: Progressing   Problem: Skin Integrity: Goal: Risk for impaired skin integrity will decrease Outcome: Progressing

## 2022-09-05 NOTE — Progress Notes (Signed)
Neurosurgery  pt seen and examined.  NAD, sitting in chair, grossly full proximal LE strength bilaterally, right foot in ankle protector.  - cont supportive care

## 2022-09-05 NOTE — Progress Notes (Signed)
Patient off the unit for MRI.

## 2022-09-05 NOTE — Progress Notes (Signed)
Physical Therapy Treatment Patient Details Name: David [name] MRN: [id number] DOB: 1960-04-13 Today's Date: 09/05/2022   History of Present Illness Pt is a 62 y.o. male who presented 08/20/22  with hypoglycemia. 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 5/28 surgical repair: T5-11 posterior/posterolateral arthrodesis  & Transpedicular decompression with partial corpectomy, left T8 & T8-9 laminectomy, bilateral facetectomy. 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 Pt needed mod A +2 to come to EOB. In sitting TLSO and R Darco were donned. Pt stood from bed to RW with max A +2, B knees buckling and pt unable to wt shift or step feet. Pt then stood to stedy with mod A +2 and worked on sit>stand from higher surface for strengthening. Pt's LE's very weak at this time. LE there ex performed in chair. VSS on RA though pt reported dizziness in sitting and standing. Patient will benefit from continued inpatient follow up therapy, <3 hours/day. PT will continue to follow.    Recommendations for follow up therapy are one component of a multi-disciplinary discharge planning process, led by the attending physician.  Recommendations may be updated based on patient status, additional functional criteria and insurance authorization.  Follow Up Recommendations  Can patient physically be transported by private vehicle: No    Assistance Recommended at Discharge Frequent or constant Supervision/Assistance  Patient can return  home with the following Assist for transportation;Two people to help with walking and/or transfers;A lot of help with bathing/dressing/bathroom;Assistance with cooking/housework;Direct supervision/assist for financial management;Direct supervision/assist for medications management   Equipment Recommendations  Other (comment) (TBA further)    Recommendations for Other Services       Precautions / Restrictions Precautions Precautions: Fall;Back Precaution Booklet Issued: No Precaution Comments: JP drain Required Braces or Orthoses: Spinal Brace Spinal Brace: Thoracolumbosacral orthotic;Applied in sitting position Other Brace: for use when ambulating Restrictions Weight Bearing Restrictions: No RLE Weight Bearing: Weight bearing as tolerated RLE Partial Weight Bearing Percentage or Pounds: heel weight bearing Other Position/Activity Restrictions: with Darco shoe     Mobility  Bed Mobility Overal bed mobility: Needs Assistance Bed Mobility: Rolling, Sidelying to Sit Rolling: Mod assist Sidelying to sit: +2 for physical assistance, Mod assist       General bed mobility comments: pt rolled each way for coming off bedpan and clean up before coming to EOB. Needs more assist rolling to R due to more difficulty pushing with LLE. Mod A to lower and upper body to transition from SL to sit    Transfers Overall transfer level: Needs assistance Equipment used: Rolling walker (2 wheels), Ambulation equipment used Transfers: Sit to/from Stand, Bed to chair/wheelchair/BSC Sit to Stand: +2 physical assistance, From elevated surface, Mod assist, Max assist Stand pivot transfers: Total assist, +2 safety/equipment         General transfer comment: pt stood from bed to RW with max A +2 with B knees buckling and inability to wt shift or step feet in place. For safety, Stedy used for subsequent  sit>stand and transfer to recliner. Pt able to stand to stedy with mod A +2 and from flaps of stedy  with min A +2, 3x for strengthening Transfer via Lift Equipment: Stedy  Ambulation/Gait               General Gait Details: unable at this time   Social research officer, government David [name] (Stroke Patients Only)       Balance Overall balance assessment: Needs assistance Sitting-balance support: Feet supported Sitting balance-Leahy Scale: Fair Sitting balance - Comments: pt tolerated sitting with BUE support >5 mins   Standing balance support: Bilateral upper extremity supported, During functional activity Standing balance-Leahy Scale: Zero Standing balance comment: MaxAx2 to stand with bil knees buckling                            Cognition Arousal/Alertness: Awake/alert Behavior During Therapy: Impulsive, Flat affect Overall Cognitive Status: Impaired/Different from baseline Area of Impairment: Attention, Memory, Following commands, Safety/judgement, Awareness, Problem solving, Orientation                 Orientation Level: Disoriented to, Situation Current Attention Level: Sustained Memory: Decreased recall of precautions, Decreased short-term memory Following Commands: Follows one step commands with increased time Safety/Judgement: Decreased awareness of deficits, Decreased awareness of safety Awareness: Intellectual Problem Solving: Slow processing, Decreased initiation, Requires verbal cues General Comments: beneifts from simple 1 step cues, needs direction for all initiation, sequencing and problem solving. mildly impulsive with movements        Exercises General Exercises - Lower Extremity Ankle Circles/Pumps: AROM, Both, 10 reps, Supine Long Arc Quad: AROM, Both, 10 reps, Seated (vc's for controlled mvmt)    General Comments General comments (skin integrity, edema, etc.): pt dizzy in sitting but VSS on RA. TLSO and Darco donned sitting EOB.      Pertinent Vitals/Pain Pain Assessment Pain Assessment:  Faces Faces Pain Scale: Hurts little more Breathing: normal Negative Vocalization: none Facial Expression: smiling or inexpressive Body Language: relaxed Consolability: no need to console PAINAD Score: 0 Pain Location: back Pain Descriptors / Indicators: Discomfort, Grimacing, Guarding Pain Intervention(s): Limited activity within patient's tolerance, Monitored during session, Repositioned, Premedicated before session    Home Living                          Prior Function            PT Goals (current goals can now be found in the care plan section) Acute Rehab PT Goals Patient Stated Goal: to improve and reduce pain PT Goal Formulation: With patient Time For Goal Achievement: 09/14/22 Potential to Achieve Goals: Good Progress towards PT goals: Progressing toward goals    Frequency    Min 5X/week      PT Plan Current plan remains appropriate    Co-evaluation PT/OT/SLP Co-Evaluation/Treatment: Yes Reason for Co-Treatment: Complexity of the patient's impairments (multi-system involvement);For patient/therapist safety;To address functional/ADL transfers PT goals addressed during session: Mobility/safety with mobility;Balance;Proper use of DME OT goals addressed during session: ADL's and self-care      AM-PAC PT "6 Clicks" Mobility   Outcome Measure  Help needed turning from your back to your side while in a flat bed without using bedrails?: A Lot Help needed moving from lying on your back to sitting on the side of a flat bed without  using bedrails?: Total Help needed moving to and from a bed to a chair (including a wheelchair)?: Total Help needed standing up from a chair using your arms (e.g., wheelchair or bedside chair)?: Total Help needed to walk in hospital room?: Total Help needed climbing 3-5 steps with a railing? : Total 6 Click Score: 7    End of Session Equipment Utilized During Treatment: Back brace;Gait belt;Other (comment) (darco  shoe) Activity Tolerance: Patient tolerated treatment well Patient left: with call bell/phone within reach;in chair Nurse Communication: Mobility status (time to return to bed) PT Visit Diagnosis: Difficulty in walking, not elsewhere classified (R26.2);Other abnormalities of gait and mobility (R26.89);Muscle weakness (generalized) (M62.81);Pain;Unsteadiness on feet (R26.81) Pain - Right/Left: Right Pain - part of body:  (back)     Time: 4098-1191 PT Time Calculation (min) (ACUTE ONLY): 36 min  Charges:  $Therapeutic Activity: 8-22 mins                     Lyanne Co, PT  Acute Rehab Services Secure chat preferred Office [phone]    Lawana Chambers Nataleah Scioneaux 09/05/2022, 2:23 PM

## 2022-09-05 NOTE — Progress Notes (Addendum)
Palliative Care Progress Note  Patient continues to have significant pain, limiting mobility. Has chest pain when taking a deep breath, fullness in his abdomen, reports he can't move his left leg-extremely weak, complicated by post-operative delirium -multifactorial. He has poor appetite, more withdrawn and loss of executive function.  Assessment:  Delirium and Pain: Patients delirium is most likely not not being caused by opioids, he is on much less overall opioid after he was switched to fentanyl patch and prn IV, and initial concern was for opioid neurotoxicity which does not occur with fentanyl because there are not metabolites that accumulate causing neurotoxicity symptoms. If any thing he may have had withdrawal making things worse, and I would also suspect hyponatremia with a serum sodium of 121 being the mostly likely culprit of the AMS.   I gave IV fentanyl at time of visit along with Toradol and he did exceptionally well with PT today-he is opioid deficient and I discussed with nursing we needed to stay on top of pain so I could calculate a 24 hour fentanyl patch dose.  Average OME prior to 5/28 :150-179 at this time no hallucinations and on hydromorphone, morphine and oxycodone.  Average OME after 5/28: changed to fentanyl when delirium started due to concern over possible opioid neurotoxicity: 80-100 (minimal prns given because staff concerned that fentanyl was causing this, however hallucinations were occurring before this change was made and immediately post-operatively).  Based on response to PRN, will increase his Fentanyl patch to 50 mcg-this is closer to his total estimated dose before cross tolerance reduction. He also responded well to anti-inflammatory, although long term NSAIDS may be contraindicated in his heart disease and on eliquis, for now the risk outweigh the benefits.Willl start him on celebrex BIDand monitor kidney function. Unclear why his Serum sodium dropped to  121, but that was likely symptomatic postop hyponatremia-now improving.  Continue to work on pain control and encourage therapy, would rule out CNS pathology causing hyponatremia and also intrathoracic source.  Patient is also on Digoxin so I have ordered a stat digoxin level- his symptoms would fit with toxicity syndrome.  Anderson Malta, DO Palliative Medicine   Time: 60 minutes

## 2022-09-05 NOTE — Progress Notes (Signed)
[name]                                                                               David [name], is a 62 y.o. 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 Chestine Spore on 4/25 persistent A-fib on Eliquis, DM-2, seizure disorder, HTN, HLD and chronic pain return to ED with hypoglycemia to 38.  Patient was hospitalized for disseminated MRSA bacteremia from 4/9-5/17, and discharged on p.o. doxycycline.  Patient was hypoglycemic to 38 when EMS arrived.  His hypoglycemia persisted despite intervention in ED and leading to his rehospitalization.  He also have significant back pain.  MRI thoracic spine concerning for progressive discitis/osteomyelitis at T7-T9, extensive phlegmonous change in the paravertebral soft tissues,  partial collapse of T8 vertebral body with 40% vertebral body height loss and approximately 6 mm bony retropulsion, moderate canal stenosis and mild cord edema.  Neurosurgery and ID consulted.   Patient underwent surgical intervention by  neurosurgery. Palliative medicine helping with pain management.  Therapy recommended CIR, unfortunately, pt does not have caregiver support, hence SNF is now recommended.   Patient remains confused despite adjusting pain meds. His sodium has improved. Ct head did not reveal anything acute. Neuro exam is grossly normal except for his mental status. Will get MRI brain without contrast for further evalaution.      Assessment & Plan    Assessment and Plan:  Disseminated MRSA bacteremia with T7 to T9 discitis and osteomyelitis:  MRI concerning for progressive discitis/osteomyelitis.  Patient was hospitalized for this from 4/9-5/17 and discharged on  doxycycline.   Previously treated with daptomycin followed by vancomycin and oritavancin. -Neurosurgery following- underwent surgical intervention on 5/28. JP drain removed.  ID and palliative care consulted and on board.  ID recommended daptomycin to start on 5/29 and oral doxycycline to start on 09/28/22.  Currently on MS contin 30 mg every 12 hours, Lyrica 50 mg BID, oxycodone 15 mg every 4 hours prn, Robaxin 750 mg , tylenol, lidoderm patch.  Bowel regimen on board.  Narcan as needed.    Right foot osteomyelitis s/p right fifth ray amputation on 4/30.  Continue with antibiotics .  Weight bearing as tolerated.  Patient refusing to work with PT, reports persistent pain.     Chronic HFrEF with recovered EF:  Euvolemic on exam.  Creatinine back to baseline.    Hypomagnesemia:  Replaced. Repeat level wnl.   Hypertension:  BP parameters are optimal.  Continue with coreg 12.5 mg BID.  Prn IV labetalol.    Acute encephalopathy:  - suspect from pain  meds vs hyponatremia vs withdrawals.  - intermittent hallucinations.  - ammonia level is 38.  - CT head without contrast does not show any acute findings.     Persistent atrial fibrillation  Rate controlled.  Eliquis is on hold.  Anti coagulation to be held  for one week as per neurosurgery. Should be able to start IV heparin vs eliquis in the next 24 hours. Will discuss with neuro surgery.     Moderate pleural effusion:  - monitor. He remains on RA without sob.    Hypokalemia Replaced. Repeat level wnl.   Constipation:  Resolved.    Physical deconditioning  Therapy eval CIR but pt does not have support at home.    Hypotension:  Resolved.   Acute blood loss anemia:  Hemoglobin around 10 at baseline, dropped to 8.5 to 7.1 , from blood loss from surgery.  Continue to monitor.  S/p 1 unit of prbc transfusion, repeat hemoglobin is improved to 8 and remained stable around 8.2   Hyponatremia:  Unclear etiology,  poor oral intake. ? Fluid overload. Sodium improved from 123 to 128 to 132 today Continue to monitor. SIADH ruled out.  Serum osmo is 287, urine sodium is less than 10, TSH wnl.  Serum cortisol is 12.     AKI:  Suspect from hypotension, dehydration. Resolved.    Uncontrolled insulin dependent DM with hypoglycemia and hyperglycemia.  A1c is 9.1% CBG (last 3)  Recent Labs    09/04/22 2205 09/05/22 0758 09/05/22 1233  GLUCAP 204* 195* 250*   Continue with SSI. Uncontrolled with hyperglycemia:  Continue with SEMGLEE and novolog TIDAC.       Estimated body mass index is 24.91 kg/m as calculated from the following:   Height as of this encounter: 6' (1.829 m).   Weight as of this encounter: 83.3 kg.  Code Status: full code.  DVT Prophylaxis:  SCD's Start: 08/30/22 1939   Level of Care: Level of care: Med-Surg Family Communication: none at bedside.   Disposition Plan:     Remains inpatient appropriate:  pending.   Procedures:  T5-6, T6-7, T7-8, T8-9, T9-10, T10-11 posterior/posterolateral arthrodesis  Consultants:   Neuro surgery.   Antimicrobials:   Anti-infectives (From admission, onward)    Start     Dose/Rate Route Frequency Ordered Stop   09/28/22 1000  doxycycline (VIBRA-TABS) tablet 100 mg        100 mg Oral Every 12 hours 09/01/22 1113     08/31/22 2000  DAPTOmycin (CUBICIN) 650 mg in sodium chloride 0.9 % IVPB        8 mg/kg  83.3 kg 126 mL/hr over 30 Minutes Intravenous Daily 08/31/22 1053 09/27/22 2359   08/30/22 2300  vancomycin (VANCOREADY) IVPB 750 mg/150 mL  Status:  Discontinued        750 mg 150 mL/hr over 60 Minutes Intravenous Every 12 hours 08/30/22 1150 08/31/22 1053   08/30/22 1712  vancomycin (VANCOCIN) powder  Status:  Discontinued          As needed 08/30/22 1714 08/30/22 1756   08/30/22 1142  ceFAZolin (ANCEF) 2-4 GM/100ML-% IVPB  Status:  Discontinued       Note to Pharmacy: [facility] [place]: cabinet override      08/30/22 1142 08/30/22  1300   08/30/22 0600  ceFAZolin (ANCEF) IVPB 2g/100 mL premix        2 g 200 mL/hr over 30 Minutes Intravenous On call to O.R. 08/29/22 1848 08/30/22 1401   08/27/22 1041  vancomycin (VANCOCIN) IVPB 1000 mg/200 mL premix  Status:  Discontinued        1,000 mg 200 mL/hr over 60 Minutes Intravenous Every 12 hours 08/27/22 0802 08/30/22 1150   08/26/22 2300  vancomycin (VANCOREADY) IVPB 1250 mg/250 mL  Status:  Discontinued  1,250 mg 166.7 mL/hr over 90 Minutes Intravenous Every 24 hours 08/26/22 1014 08/27/22 0802   08/23/22 2200  vancomycin (VANCOREADY) IVPB 750 mg/150 mL  Status:  Discontinued        750 mg 150 mL/hr over 60 Minutes Intravenous Every 12 hours 08/23/22 0938 08/26/22 1014   08/23/22 1030  vancomycin (VANCOREADY) IVPB 1500 mg/300 mL        1,500 mg 150 mL/hr over 120 Minutes Intravenous  Once 08/23/22 0938 08/23/22 1312   08/20/22 2200  doxycycline (VIBRA-TABS) tablet 100 mg  Status:  Discontinued        100 mg Oral 2 times daily 08/20/22 1737 08/23/22 0927        Medications  Scheduled Meds:  sodium chloride   Intravenous Once   acetaminophen  1,000 mg Oral TID   atorvastatin  80 mg Oral Daily   busPIRone  15 mg Oral BID   carvedilol  3.125 mg Oral BID   Chlorhexidine Gluconate Cloth  6 each Topical Daily   digoxin  125 mcg Oral Daily   [START ON 09/28/2022] doxycycline  100 mg Oral Q12H   famotidine  20 mg Oral BID   feeding supplement  237 mL Oral BID BM   fentaNYL  1 patch Transdermal Q48H   fluticasone  1 spray Each Nare Daily   insulin aspart  0-5 Units Subcutaneous QHS   insulin aspart  0-9 Units Subcutaneous TID WC   insulin glargine-yfgn  15 Units Subcutaneous Daily   lidocaine  1 patch Transdermal Q24H   magnesium oxide  400 mg Oral BID   methocarbamol  750 mg Oral QID   pantoprazole  40 mg Oral Daily   polyethylene glycol  17 g Oral BID   pregabalin  50 mg Oral TID   senna-docusate  1 tablet Oral BID   sodium chloride flush  10-40 mL  Intracatheter Q12H   sodium chloride flush  3 mL Intravenous Q12H   Continuous Infusions:  sodium chloride Stopped (09/01/22 1621)   DAPTOmycin (CUBICIN) 650 mg in sodium chloride 0.9 % IVPB Stopped (09/04/22 1422)   PRN Meds:.bisacodyl, haloperidol lactate, labetalol, ondansetron **OR** ondansetron (ZOFRAN) IV, mouth rinse, oxyCODONE, sodium chloride flush, sodium chloride flush, sodium phosphate    Subjective:   David [name] was seen and examined today.  Alert and oriented to person only.   Objective:   Vitals:   09/04/22 1606 09/05/22 0000 09/05/22 0443 09/05/22 0805  BP: 118/68  128/82 (!) 156/90  Pulse: 60  72 74  Resp: 16 17 17 16   Temp: 98.3 F (36.8 C)  98.2 F (36.8 C) 98.4 F (36.9 C)  TempSrc: Oral  Oral Oral  SpO2: 100%  100% 99%  Weight:      Height:        Intake/Output Summary (Last 24 hours) at 09/05/2022 1438 Last data filed at 09/05/2022 1115 Gross per 24 hour  Intake 1600 ml  Output 4500 ml  Net -2900 ml    Filed Weights   08/20/22 1821 08/23/22 2307  Weight: 77.8 kg 83.3 kg     Exam General exam: Appears calm and comfortable  Respiratory system: Clear to auscultation. Respiratory effort normal. Cardiovascular system: S1 & S2 heard, RRR. No JVD,  Gastrointestinal system: Abdomen is nondistended, soft and nontender.  Central nervous system: Alert and  following commands.  Extremities:  right toe amputation.  Skin: No rashes,  Psychiatry: confused .       Data Reviewed:  I have  personally reviewed following labs and imaging studies   CBC Lab Results  Component Value Date   WBC 9.3 09/04/2022   RBC 2.83 (L) 09/04/2022   HGB 8.2 (L) 09/04/2022   HCT 24.9 (L) 09/04/2022   MCV 88.0 09/04/2022   MCH 29.0 09/04/2022   PLT 275 09/04/2022   MCHC 32.9 09/04/2022   RDW 16.8 (H) 09/04/2022   LYMPHSABS 0.9 09/04/2022   MONOABS 1.2 (H) 09/04/2022   EOSABS 0.3 09/04/2022   BASOSABS 0.0 09/04/2022     Last metabolic panel Lab Results   Component Value Date   NA 132 (L) 09/05/2022   K 4.0 09/05/2022   CL 101 09/05/2022   CO2 23 09/05/2022   BUN 13 09/05/2022   CREATININE 0.94 09/05/2022   GLUCOSE 218 (H) 09/05/2022   GFRNONAA >60 09/05/2022   GFRAA >60 08/30/2019   CALCIUM 9.2 09/05/2022   PHOS 4.0 08/31/2022   PROT 7.0 08/21/2022   ALBUMIN 2.7 (L) 08/31/2022   LABGLOB 2.4 01/08/2021   AGRATIO 1.7 01/08/2021   BILITOT 0.6 08/21/2022   ALKPHOS 82 08/21/2022   AST 11 (L) 08/21/2022   ALT 13 08/21/2022   ANIONGAP 8 09/05/2022    CBG (last 3)  Recent Labs    09/04/22 2205 09/05/22 0758 09/05/22 1233  GLUCAP 204* 195* 250*       Coagulation Profile: Recent Labs  Lab 08/29/22 2011  INR 1.2      Radiology Studies: CT HEAD WO CONTRAST ( )  Result Date: 09/03/2022 CLINICAL DATA:  Altered mental status EXAM: CT HEAD WITHOUT CONTRAST TECHNIQUE: Contiguous axial images were obtained from the base of the skull through the vertex without intravenous contrast. RADIATION DOSE REDUCTION: This exam was performed according to the departmental dose-optimization program which includes automated exposure control, adjustment of the mA and/or kV according to patient size and/or use of iterative reconstruction technique. COMPARISON:  08/29/2021 FINDINGS: Brain: No evidence of acute infarction, hemorrhage, mass, mass effect, or midline shift. No hydrocephalus or extra-axial fluid collection. Vascular: No hyperdense vessel. Atherosclerotic calcifications in the intracranial carotid and vertebral arteries. Skull: Negative for fracture or focal lesion. Sinuses/Orbits: No acute finding. Other: The mastoid air cells are well aerated. IMPRESSION: No acute intracranial process. Electronically Signed   By: Wiliam Ke M.D.   On: 09/03/2022 23:57       Kathlen Mody M.D. [name] 09/05/2022, 2:38 PM  Available via Epic secure chat 7am-7pm After 7 pm, please refer to night coverage provider listed on amion.

## 2022-09-05 NOTE — Progress Notes (Signed)
OT impression: Pt is making great progress towards their acute OT goals, session completed with PT fro safety. Upon arrival, pt was on bed pan and needed mod A to roll and total A for pericare at bed level in sidelying. He tolerated transferring from side>sitting with maximal cues for spinal precautions, step by step cues and mod A. Once sitting he verbalized how to don his TLSO, and darco shoe, they were dependently donned. Overall he needed mod A +2 to stand with RW, but was unable to progress to stepping due to BLE weakness with buckling. Stedy used for dependent transfer from [employer]. OT to continue to follow acutely to facilitate progress towards established goals. Pt will continue to benefit from intensive inpatient follow up therapy, >3 hours/day after discharge.     09/05/22 1300  OT Visit Information  Last OT Received On 09/05/22  Assistance Needed +2  PT/OT/SLP Co-Evaluation/Treatment Yes  Reason for Co-Treatment Complexity of the patient's impairments (multi-system involvement);For patient/therapist safety;To address functional/ADL transfers  OT goals addressed during session ADL's and self-care  History of Present Illness Pt is a 62 y.o. male who presented 08/20/22  with hypoglycemia. 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 5/28 surgical repair: T5-11 posterior/posterolateral arthrodesis  & Transpedicular decompression with partial corpectomy, left T8 & T8-9 laminectomy, bilateral facetectomy. 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  Precautions  Precautions Fall;Back  Precaution Booklet Issued No  Required Braces or Orthoses Spinal Brace  Spinal Brace  TLSO;Applied in sitting position  Other Brace for use when ambulating  Restrictions  Weight Bearing Restrictions No  RLE Weight Bearing WBAT  RLE Partial Weight Bearing Percentage or Pounds heel weight bearing  Other Position/Activity Restrictions with Darco shoe  Pain Assessment  Pain Assessment Faces  Faces Pain Scale 2  Pain Location back  Pain Descriptors / Indicators Discomfort;Grimacing;Guarding  Pain Intervention(s) Limited activity within patient's tolerance;Monitored during session  Cognition  Arousal/Alertness Awake/alert  Behavior During Therapy Impulsive;Flat affect  Overall Cognitive Status Impaired/Different from baseline  Area of Impairment Attention;Memory;Following commands;Safety/judgement;Awareness;Problem solving;Orientation  Orientation Level Disoriented to;Situation  Current Attention Level Sustained  Memory Decreased recall of precautions;Decreased short-term memory  Following Commands Follows one step commands with increased time  Safety/Judgement Decreased awareness of deficits;Decreased awareness of safety  Awareness Intellectual  Problem Solving Slow processing;Decreased initiation;Requires verbal cues  General Comments beneifts from simple 1 step cues, needs direction for all initiation, sequensing and problem solving. mildly impulsive with movements  Upper Extremity Assessment  Upper Extremity Assessment Generalized weakness  Lower Extremity Assessment  Lower Extremity Assessment Defer to PT evaluation  Vision- Assessment  Vision Assessment? No apparent visual deficits  Praxis  Praxis Not tested  ADL  Overall ADL's  Needs assistance/impaired  Upper Body Dressing  Maximal assistance;Sitting  Upper Body Dressing Details (indicate cue type and reason) TLSO in sitting, pt able to verbally direct how to don brace  Lower Body Dressing Total assistance  Lower Body Dressing Details (indicate cue type and reason) to don darco shoe  Toilet Transfer Total  assistance;+2 for physical assistance;+2 for safety/equipment  Toilet Transfer Details (indicate cue type and reason) with stedy. max A +2 for STS  Toileting- Clothing Manipulation  and Hygiene Total assistance;+2 for physical assistance;+2 for safety/equipment;Bed level  Toileting - Clothing Manipulation Details (indicate cue type and reason) peri car ein sidelying prior to EOB  Functional mobility during ADLs Total assistance  General ADL Comments limited by pain, back precautions, BLE weakness and buckling with WBing  Bed Mobility  Overal bed mobility Needs Assistance  Bed Mobility Rolling;Sidelying to Sit  Rolling Mod assist  Sidelying to sit Mod assist;+2 for physical assistance;+2 for safety/equipment  General bed mobility comments pt rolled each way for coming off bedpan and clean up before coming to EOB. Needs more assist rolling to R due to more difficulty pushing with LLE. Mod A to lower and upper body to transition from SL to sit  Transfers  Overall transfer level Needs assistance  Equipment used Rolling walker (2 wheels)  Transfers Sit to/from Stand  Sit to Stand Mod assist;+2 physical assistance;+2 safety/equipment  Bed to/from chair/wheelchair/BSC transfer type: Via Paramedic  Balance  Overall balance assessment Needs assistance  Sitting-balance support Feet supported  Sitting balance-Leahy Scale Fair  Sitting balance - Comments benefits from BUE support  Standing balance support Bilateral upper extremity supported  Standing balance-Leahy Scale Zero  General Comments  General comments (skin integrity, edema, etc.) dizzy in sitting but VSS  OT - End of Session  Equipment Utilized During Treatment Rolling walker (2 wheels)  Activity Tolerance Patient tolerated treatment well  Patient left in chair;with call bell/phone within reach  Nurse Communication Mobility status  OT Assessment/Plan  OT Plan Discharge plan remains appropriate   OT Visit Diagnosis Unsteadiness on feet (R26.81);Other abnormalities of gait and mobility (R26.89);Muscle weakness (generalized) (M62.81);Pain  OT Frequency (ACUTE ONLY) Min 2X/week  Recommendations for Other Services Rehab consult  Follow Up Recommendations Acute inpatient rehab (3hours/day)  Assistance recommended at discharge Frequent or constant Supervision/Assistance  Patient can return home with the following A lot of help with walking and/or transfers;A lot of help with bathing/dressing/bathroom  OT Equipment BSC/3in1  AM-PAC OT "6 Clicks" Daily Activity Outcome Measure (Version 2)  Help from another person eating meals? 4  Help from another person taking care of personal grooming? 3  Help from another person toileting, which includes using toliet, bedpan, or urinal? 1  Help from another person bathing (including washing, rinsing, drying)? 1  Help from another person to put on and taking off regular upper body clothing? 2  Help from another person to put on and taking off regular lower body clothing? 1  6 Click Score 12  Progressive Mobility  What is the highest level of mobility based on the progressive mobility assessment? Level 3 (Stands with assist) - Balance while standing  and cannot march in place  Activity Stood at bedside  OT Goal Progression  Progress towards OT goals Progressing toward goals  Acute Rehab OT Goals  Patient Stated Goal to get better  OT Goal Formulation With patient  Time For Goal Achievement 09/14/22  Potential to Achieve Goals Fair  ADL Goals  Pt Will Perform Grooming Independently;standing  Pt Will Perform Lower Body Bathing with mod assist;sit to/from stand  Pt Will Perform Lower Body Dressing with mod assist;sit to/from stand  Pt Will Transfer to Toilet bedside commode;stand pivot transfer  Pt Will Perform Toileting - Clothing Manipulation and hygiene with modified independence;sit to/from stand  Additional ADL Goal #1 Pt will tolerate OOB  functional tasks for at least 5 minutes withtout sitting rest break to demonstrate increased endurance  OT Time Calculation  OT Start Time (ACUTE ONLY) 1304  OT Stop Time (ACUTE ONLY) 1340  OT Time Calculation (min) 36 min  OT General Charges  $OT Visit 1 Visit  OT Treatments  $Self Care/Home Management  8-22 mins  Perception  Perception Not tested

## 2022-09-05 NOTE — TOC Progression Note (Signed)
Transition of Care [facility]) - Progression Note    Patient Details  Name: David [name] MRN: [id number] Date of Birth: 1961/01/11  [facility] CM/SW Contact  Baldemar Lenis, [place] Phone Number: 09/05/2022, 4:23 PM  Clinical Narrative:   CSW received paperwork from financial counseling that patient will need to sign for them to file for Medicaid and Disability on his behalf, but per medical team patient is having some hallucinations and may not be within his full mind to sign off on financial documentation today, will wait. CSW to follow.    Expected Discharge Plan: Skilled Nursing Facility Barriers to Discharge: Continued Medical Work up, Inadequate or no insurance  Expected Discharge Plan and Services   Discharge Planning Services: CM Consult Post Acute Care Choice: Home Health Living arrangements for the past 2 months: Hotel/Motel                                       Social Determinants of Health (SDOH) Interventions SDOH Screenings   Food Insecurity: Patient Unable To Answer (08/23/2022)  Housing: Patient Unable To Answer (08/23/2022)  Transportation Needs: Patient Unable To Answer (08/23/2022)  Utilities: Patient Unable To Answer (08/23/2022)  Depression (PHQ2-9): Low Risk  (11/12/2021)  Stress: Stress Concern Present (09/26/2017)  Tobacco Use: Medium Risk (08/30/2022)    Readmission Risk Interventions    09/06/2021   12:07 PM  Readmission Risk Prevention Plan  Post Dischage Appt Complete  Medication Screening Complete  Transportation Screening Complete

## 2022-09-06 DIAGNOSIS — M4644 Discitis, unspecified, thoracic region: Secondary | ICD-10-CM | POA: Diagnosis not present

## 2022-09-06 DIAGNOSIS — E119 Type 2 diabetes mellitus without complications: Secondary | ICD-10-CM | POA: Diagnosis not present

## 2022-09-06 DIAGNOSIS — E876 Hypokalemia: Secondary | ICD-10-CM

## 2022-09-06 DIAGNOSIS — I10 Essential (primary) hypertension: Secondary | ICD-10-CM | POA: Diagnosis not present

## 2022-09-06 DIAGNOSIS — E162 Hypoglycemia, unspecified: Secondary | ICD-10-CM | POA: Diagnosis not present

## 2022-09-06 DIAGNOSIS — Z794 Long term (current) use of insulin: Secondary | ICD-10-CM

## 2022-09-06 LAB — GLUCOSE, CAPILLARY
Glucose-Capillary: 212 mg/dL — ABNORMAL HIGH (ref 70–99)
Glucose-Capillary: 198 mg/dL — ABNORMAL HIGH (ref 70–99)
Glucose-Capillary: 182 mg/dL — ABNORMAL HIGH (ref 70–99)
Glucose-Capillary: 157 mg/dL — ABNORMAL HIGH (ref 70–99)

## 2022-09-06 MED ORDER — INSULIN ASPART 100 UNIT/ML IJ SOLN
2.0000 [IU] | Freq: Three times a day (TID) | INTRAMUSCULAR | Status: DC
  Administered 2022-09-07 – 2022-09-09 (×8): 2 [IU] via SUBCUTANEOUS

## 2022-09-06 MED ORDER — FUROSEMIDE 20 MG PO TABS
20.0000 mg | ORAL_TABLET | Freq: Every day | ORAL | Status: DC
  Administered 2022-09-06 – 2022-09-07 (×2): 20 mg via ORAL
  Filled 2022-09-06 (×3): qty 1

## 2022-09-06 MED ORDER — KETOROLAC TROMETHAMINE 15 MG/ML IJ SOLN
15.0000 mg | Freq: Three times a day (TID) | INTRAMUSCULAR | Status: AC | PRN
  Administered 2022-09-06 – 2022-09-09 (×6): 15 mg via INTRAVENOUS
  Filled 2022-09-06 (×8): qty 1

## 2022-09-06 MED ORDER — INSULIN GLARGINE-YFGN 100 UNIT/ML ~~LOC~~ SOLN
20.0000 [IU] | Freq: Every day | SUBCUTANEOUS | Status: DC
  Administered 2022-09-07 – 2022-10-13 (×37): 20 [IU] via SUBCUTANEOUS
  Filled 2022-09-06 (×38): qty 0.2

## 2022-09-06 NOTE — Progress Notes (Signed)
Physical Therapy Treatment Patient Details Name: David [name] MRN: [id number] DOB: 1960-07-29 Today's Date: 09/06/2022   History of Present Illness Pt is a 62 y.o. male who presented 08/20/22  with hypoglycemia. 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 5/28 surgical repair: T5-11 posterior/posterolateral arthrodesis  & Transpedicular decompression with partial corpectomy, left T8 & T8-9 laminectomy, bilateral facetectomy. 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 Worked on sit>stand from various heights. Pregait activities performed in stedy including attempted stepping (limited by B knee buckle), wt shifting, and static standing tolerance. Pt performed seated there ex in recliner. Patient will benefit from continued inpatient follow up therapy, <3 hours/day. PT will continue to follow.    Recommendations for follow up therapy are one component of a multi-disciplinary discharge planning process, led by the attending physician.  Recommendations may be updated based on patient status, additional functional criteria and insurance authorization.  Follow Up Recommendations  Can patient physically be transported by private vehicle: No    Assistance Recommended at Discharge Frequent or constant Supervision/Assistance  Patient can return home with the following Assist for transportation;Two people to help with walking and/or transfers;A lot of help with bathing/dressing/bathroom;Assistance with cooking/housework;Direct supervision/assist for financial management;Direct supervision/assist for medications  management   Equipment Recommendations  Rolling walker (2 wheels)    Recommendations for Other Services       Precautions / Restrictions Precautions Precautions: Fall;Back Precaution Booklet Issued: No Precaution Comments: JP drain Required Braces or Orthoses: Spinal Brace Spinal Brace: Thoracolumbosacral orthotic;Applied in sitting position Other Brace: for use when ambulating Restrictions Weight Bearing Restrictions: Yes RLE Weight Bearing: Weight bearing as tolerated RLE Partial Weight Bearing Percentage or Pounds: heel weight bearing Other Position/Activity Restrictions: with Darco shoe     Mobility  Bed Mobility Overal bed mobility: Needs Assistance Bed Mobility: Rolling, Sidelying to Sit Rolling: Min assist Sidelying to sit: Min assist Supine to sit:  (from bed in chair position and pt pulling on bed rails)     General bed mobility comments: pt able to roll to L with min A to LE's. Min A for LE's off EOB and min A to trunk for elevation into sitting    Transfers Overall transfer level: Needs assistance Equipment used: Ambulation equipment used Transfers: Sit to/from Stand, Bed to chair/wheelchair/BSC Sit to Stand: Min assist, From elevated surface, Mod assist Stand pivot transfers: Total assist         General transfer comment: worked on standing from multilevel surfaces. Pt stood from elevated height with min A, lower height with mod A. SIt>stand 5x for strengthening Transfer via Lift Equipment: Stedy  Ambulation/Gait             Pre-gait activities: attempted picking each foot up in standing but pt unable, opposite knee buckling. Worked on wt shifting R and L with mod A. Maintained static standing x1 min straight while working on breath support by singing a familiart tune General Gait Details: unable at this time   Social research officer, government [employer] (Stroke Patients  Only)       Balance Overall balance  assessment: Needs assistance Sitting-balance support: Feet supported Sitting balance-Leahy Scale: Fair Sitting balance - Comments: pt tolerated sitting with BUE support >5 mins   Standing balance support: Bilateral upper extremity supported, During functional activity Standing balance-Leahy Scale: Poor Standing balance comment: heavy reliance on UE support due to knees buckling                            Cognition Arousal/Alertness: Awake/alert Behavior During Therapy: WFL for tasks assessed/performed Overall Cognitive Status: Impaired/Different from baseline Area of Impairment: Attention, Memory, Following commands, Safety/judgement, Awareness, Problem solving                   Current Attention Level: Selective Memory: Decreased short-term memory Following Commands: Follows one step commands consistently, Follows multi-step commands with increased time Safety/Judgement: Decreased awareness of deficits, Decreased awareness of safety Awareness: Intellectual Problem Solving: Slow processing, Decreased initiation, Requires verbal cues General Comments: pt continues to need clear instructions for mvmt but initiates on his own and is able to converse while mobilizing and continue with task        Exercises General Exercises - Lower Extremity Ankle Circles/Pumps: AROM, Both, 10 reps, Seated Long Arc Quad: AROM, Both, 10 reps, Seated (vc's for controlled mvmt) Hip Flexion/Marching: AROM, Both, 10 reps, Seated Heel Raises: AROM, Both, 10 reps, Seated    General Comments General comments (skin integrity, edema, etc.): SPO2 96% on RA, 2/4 DOE, HR in 60's      Pertinent Vitals/Pain Pain Assessment Pain Assessment: Faces Faces Pain Scale: Hurts even more Pain Location: back Pain Descriptors / Indicators: Discomfort, Grimacing, Guarding Pain Intervention(s): Limited activity within patient's tolerance, Monitored during session, Premedicated before session    Home  Living                          Prior Function            PT Goals (current goals can now be found in the care plan section) Acute Rehab PT Goals Patient Stated Goal: to improve and reduce pain PT Goal Formulation: With patient Time For Goal Achievement: 09/14/22 Potential to Achieve Goals: Good Progress towards PT goals: Progressing toward goals    Frequency    Min 5X/week      PT Plan Current plan remains appropriate    Co-evaluation              AM-PAC PT "6 Clicks" Mobility   Outcome Measure  Help needed turning from your back to your side while in a flat bed without using bedrails?: A Lot Help needed moving from lying on your back to sitting on the side of a flat bed without using bedrails?: A Lot Help needed moving to and from a bed to a chair (including a wheelchair)?: Total Help needed standing up from a chair using your arms (e.g., wheelchair or bedside chair)?: Total Help needed to walk in hospital room?: Total Help needed climbing 3-5 steps with a railing? : Total 6 Click Score: 8    End of Session Equipment Utilized During Treatment: Back brace;Gait belt Activity Tolerance: Patient tolerated treatment well Patient left: with call bell/phone within reach;in chair Nurse Communication: Mobility status (time to return to bed) PT Visit Diagnosis: Difficulty in walking, not elsewhere classified (R26.2);Other abnormalities of gait and mobility (R26.89);Muscle weakness (generalized) (M62.81);Pain;Unsteadiness on feet (R26.81) Pain -  Right/Left: Right Pain - part of body:  (back)     Time: 9604-5409 PT Time Calculation (min) (ACUTE ONLY): 36 min  Charges:  $Gait Training: 8-22 mins $Therapeutic Exercise: 8-22 mins                     Lyanne Co, PT  Acute Rehab Services Secure chat preferred Office [phone]    Lawana Chambers Aaren Krog 09/06/2022, 4:19 PM

## 2022-09-06 NOTE — Progress Notes (Signed)
[name]                                                                               David [name], is a 62 y.o. 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 Chestine [name] on 4/25 persistent A-fib on Eliquis, DM-2, seizure disorder, HTN, HLD and chronic pain return to ED with hypoglycemia to 38.  Patient was hospitalized for disseminated MRSA bacteremia from 4/9-5/17, and discharged on p.o. doxycycline.  Patient was hypoglycemic to 38 when EMS arrived.  His hypoglycemia persisted despite intervention in ED and leading to his rehospitalization.  He also have significant back pain.  MRI thoracic spine concerning for progressive discitis/osteomyelitis at T7-T9, extensive phlegmonous change in the paravertebral soft tissues,  partial collapse of T8 vertebral body with 40% vertebral body height loss and approximately 6 mm bony retropulsion, moderate canal stenosis and mild cord edema.  Neurosurgery and ID consulted.   Patient underwent surgical decompression by  neurosurgery. Palliative medicine helping with pain management.  Therapy recommended CIR, unfortunately, pt does not have caregiver support, hence SNF is now recommended. TOC  on board and assisting with disposition.   Patient remains confused despite adjusting pain meds. His sodium has improved. CT head and MRI brain did not reveal anything acute. Neuro exam is grossly normal except for his mental status. No source of infection.  Adjusting pain meds.      Assessment & Plan    Assessment and Plan:  Disseminated MRSA bacteremia with T7 to T9 discitis and osteomyelitis:  MRI concerning for progressive discitis/osteomyelitis.  Patient was hospitalized  for this from 4/9-5/17 and discharged on doxycycline.   Previously treated with daptomycin followed by vancomycin and oritavancin. -Neurosurgery following- underwent surgical intervention on 5/29. JP drain removed.  ID and palliative care consulted and on board.  ID recommended daptomycin to start on 5/29 and oral doxycycline to start on 09/28/22.  Currently on robaxin 750 mg every 6 hours prn.  Toradol iv 15 mg every 8 hours pen.  Lyrical 75 mg daily at bedtime.  Lidoderm patch Celecoxib 100 mg BID, Buspar  15 mg BID. Fentanyl patch 50 mcg every 72 hours.  Bowel regimen on board.  Narcan as needed.    Right foot osteomyelitis s/p right fifth ray amputation on 4/30.  Continue with antibiotics .  Weight bearing as tolerated.  Orthopedics on board.   Chronic HFrEF with recovered EF:  Started on low dose lasix for moderate pleural effusions.  Continue to monitor .    Hypomagnesemia:  Replaced. Repeat level wnl.   Hypertension:  BP parameters are well controlled.  Continue with coreg 3.125 BID Prn IV labetalol.    Acute encephalopathy:  - suspect from pain  meds vs hyponatremia vs withdrawals.  - intermittent hallucinations.  - ammonia level is 38.  - CT head without contrast does not show  any acute findings.  - MRI is negative for acute stroke.  - slowly improving. He is more alert and answering questions appropriately today.     Persistent atrial fibrillation  Rate controlled.  Eliquis is on hold.  Anti coagulation to be held for one week as per neurosurgery. Should be able to start IV heparin vs eliquis in the next 24 hours / to start on 09/07/2022. Please discuss with Neuro surgery in am before restarting Iv heparin or Eliquis.   Moderate pleural effusion:  - monitor. He remains on RA without sob.  - CT chest reviewed.    Hypokalemia Replaced. Repeat level wnl.   Constipation:  Resolved.    Physical deconditioning  Therapy eval CIR but pt does not have  support at home.    Hypotension:  Resolved.   Acute blood loss anemia:  Hemoglobin around 10 at baseline, dropped to 8.5 to 7.1 , possibly from blood loss from surgery.  Continue to monitor.  S/p 1 unit of prbc transfusion, repeat hemoglobin is improved to 8 and remained stable around 8.2   Hyponatremia:  Unclear etiology, poor oral intake. ? Fluid overload. Sodium improved from 123 to 128 to 132 today Continue to monitor. SIADH ruled out.  Serum osmo is 287, urine sodium is less than 10, TSH wnl.  Serum cortisol is 12.     AKI:  Suspect from hypotension, dehydration. Resolved.    Uncontrolled insulin dependent DM with hypoglycemia and hyperglycemia.  A1c is 9.1% CBG (last 3)  Recent Labs    09/05/22 2129 09/06/22 0614 09/06/22 1157  GLUCAP 288* 212* 198*   Continue with SSI. Uncontrolled with hyperglycemia:  Increased Semglee to 20 units daily and add 2 units of novolog TIDAC.       Estimated body mass index is 24.91 kg/m as calculated from the following:   Height as of this encounter: 6' (1.829 m).   Weight as of this encounter: 83.3 kg.  Code Status: full code.  DVT Prophylaxis:  SCD's Start: 08/30/22 1939   Level of Care: Level of care: Med-Surg Family Communication: none at bedside.   Disposition Plan:     Remains inpatient appropriate:  pending.   Procedures:  T5-6, T6-7, T7-8, T8-9, T9-10, T10-11 posterior/posterolateral arthrodesis  Consultants:   Neuro surgery.   Antimicrobials:   Anti-infectives (From admission, onward)    Start     Dose/Rate Route Frequency Ordered Stop   09/28/22 1000  doxycycline (VIBRA-TABS) tablet 100 mg        100 mg Oral Every 12 hours 09/01/22 1113     08/31/22 2000  DAPTOmycin (CUBICIN) 650 mg in sodium chloride 0.9 % IVPB        8 mg/kg  83.3 kg 126 mL/hr over 30 Minutes Intravenous Daily 08/31/22 1053 09/27/22 2359   08/30/22 2300  vancomycin (VANCOREADY) IVPB 750 mg/150 mL  Status:  Discontinued        750  mg 150 mL/hr over 60 Minutes Intravenous Every 12 hours 08/30/22 1150 08/31/22 1053   08/30/22 1712  vancomycin (VANCOCIN) powder  Status:  Discontinued          As needed 08/30/22 1714 08/30/22 1756   08/30/22 1142  ceFAZolin (ANCEF) 2-4 GM/100ML-% IVPB  Status:  Discontinued       Note to Pharmacy: [facility], [place]: cabinet override      08/30/22 1142 08/30/22 1300   08/30/22 0600  ceFAZolin (ANCEF) IVPB 2g/100 mL premix        2  g 200 mL/hr over 30 Minutes Intravenous On call to O.R. 08/29/22 1848 08/30/22 1401   08/27/22 1041  vancomycin (VANCOCIN) IVPB 1000 mg/200 mL premix  Status:  Discontinued        1,000 mg 200 mL/hr over 60 Minutes Intravenous Every 12 hours 08/27/22 0802 08/30/22 1150   08/26/22 2300  vancomycin (VANCOREADY) IVPB 1250 mg/250 mL  Status:  Discontinued        1,250 mg 166.7 mL/hr over 90 Minutes Intravenous Every 24 hours 08/26/22 1014 08/27/22 0802   08/23/22 2200  vancomycin (VANCOREADY) IVPB 750 mg/150 mL  Status:  Discontinued        750 mg 150 mL/hr over 60 Minutes Intravenous Every 12 hours 08/23/22 0938 08/26/22 1014   08/23/22 1030  vancomycin (VANCOREADY) IVPB 1500 mg/300 mL        1,500 mg 150 mL/hr over 120 Minutes Intravenous  Once 08/23/22 0938 08/23/22 1312   08/20/22 2200  doxycycline (VIBRA-TABS) tablet 100 mg  Status:  Discontinued        100 mg Oral 2 times daily 08/20/22 1737 08/23/22 0927        Medications  Scheduled Meds:  sodium chloride   Intravenous Once   acetaminophen  1,000 mg Oral TID   atorvastatin  80 mg Oral Daily   busPIRone  15 mg Oral BID   carvedilol  3.125 mg Oral BID   celecoxib  100 mg Oral BID   Chlorhexidine Gluconate Cloth  6 each Topical Daily   digoxin  125 mcg Oral Daily   [START ON 09/28/2022] doxycycline  100 mg Oral Q12H   DULoxetine  20 mg Oral Daily   famotidine  20 mg Oral BID   feeding supplement  237 mL Oral BID BM   fentaNYL  1 patch Transdermal Q48H   fluticasone  1 spray Each Nare Daily    furosemide  20 mg Oral Daily   insulin aspart  0-5 Units Subcutaneous QHS   insulin aspart  0-9 Units Subcutaneous TID WC   [START ON 09/07/2022] insulin glargine-yfgn  20 Units Subcutaneous Daily   lidocaine  1 patch Transdermal Q24H   magnesium oxide  400 mg Oral BID   oxymetazoline  1 spray Right Nare BID   pantoprazole  40 mg Oral Daily   polyethylene glycol  17 g Oral BID   pregabalin  75 mg Oral QHS   senna-docusate  1 tablet Oral BID   sodium chloride flush  10-40 mL Intracatheter Q12H   sodium chloride flush  3 mL Intravenous Q12H   Continuous Infusions:  sodium chloride Stopped (09/01/22 1621)   DAPTOmycin (CUBICIN) 650 mg in sodium chloride 0.9 % IVPB Stopped (09/05/22 1520)   PRN Meds:.bisacodyl, fentaNYL (SUBLIMAZE) injection, labetalol, methocarbamol, ondansetron **OR** ondansetron (ZOFRAN) IV, mouth rinse, oxyCODONE, sodium chloride flush, sodium chloride flush, sodium phosphate    Subjective:   David [name] was seen and examined today.  Alert and oriented today to place and person. Pain controlled with toradol.   Objective:   Vitals:   09/05/22 2030 09/06/22 0000 09/06/22 0430 09/06/22 0741  BP: 131/75 (!) 144/77 (!) 142/72 (!) 156/103  Pulse: (!) 58 63 (!) 52 (!) 46  Resp: 20 17 18 18   Temp: 99.1 F (37.3 C) 98.4 F (36.9 C) 98.1 F (36.7 C) 98.2 F (36.8 C)  TempSrc: Oral Oral Oral Oral  SpO2: 99% 100% 98% 97%  Weight:      Height:  Intake/Output Summary (Last 24 hours) at 09/06/2022 1414 Last data filed at 09/06/2022 1100 Gross per 24 hour  Intake 200 ml  Output 3400 ml  Net -3200 ml    Filed Weights   08/20/22 1821 08/23/22 2307  Weight: 77.8 kg 83.3 kg     Exam General exam: well developed , not in distress.  Respiratory system: diminished at bases. On RA,  Cardiovascular system: S1 & S2 heard, RRR.  Gastrointestinal system: Abdomen is nondistended, soft and nontender.  Central nervous system: Alert and oriented. No focal neurological  deficits. Extremities: mild pedal edema Skin: No rashes,  Psychiatry: calm.         Data Reviewed:  I have personally reviewed following labs and imaging studies   CBC Lab Results  Component Value Date   WBC 9.3 09/04/2022   RBC 2.83 (L) 09/04/2022   HGB 8.2 (L) 09/04/2022   HCT 24.9 (L) 09/04/2022   MCV 88.0 09/04/2022   MCH 29.0 09/04/2022   PLT 275 09/04/2022   MCHC 32.9 09/04/2022   RDW 16.8 (H) 09/04/2022   LYMPHSABS 0.9 09/04/2022   MONOABS 1.2 (H) 09/04/2022   EOSABS 0.3 09/04/2022   BASOSABS 0.0 09/04/2022     Last metabolic panel Lab Results  Component Value Date   NA 132 (L) 09/05/2022   K 4.0 09/05/2022   CL 101 09/05/2022   CO2 23 09/05/2022   BUN 13 09/05/2022   CREATININE 0.94 09/05/2022   GLUCOSE 218 (H) 09/05/2022   GFRNONAA >60 09/05/2022   GFRAA >60 08/30/2019   CALCIUM 9.2 09/05/2022   PHOS 4.0 08/31/2022   PROT 7.0 08/21/2022   ALBUMIN 2.7 (L) 08/31/2022   LABGLOB 2.4 01/08/2021   AGRATIO 1.7 01/08/2021   BILITOT 0.6 08/21/2022   ALKPHOS 82 08/21/2022   AST 11 (L) 08/21/2022   ALT 13 08/21/2022   ANIONGAP 8 09/05/2022    CBG (last 3)  Recent Labs    09/05/22 2129 09/06/22 0614 09/06/22 1157  GLUCAP 288* 212* 198*       Coagulation Profile: No results for input(s): "INR", "PROTIME" in the last 168 hours.    Radiology Studies: CT CHEST W CONTRAST  Result Date: 09/05/2022 CLINICAL DATA:  Chronic dyspnea of unclear etiology.  Hypoglycemia. EXAM: CT CHEST WITH CONTRAST TECHNIQUE: Multidetector CT imaging of the chest was performed during intravenous contrast administration. RADIATION DOSE REDUCTION: This exam was performed according to the departmental dose-optimization program which includes automated exposure control, adjustment of the mA and/or kV according to patient size and/or use of iterative reconstruction technique. CONTRAST:  50mL OMNIPAQUE IOHEXOL 350 MG/ML SOLN COMPARISON:  Chest radiograph 09/01/2022.  CT  08/22/2022 FINDINGS: Cardiovascular: Cardiac enlargement. No pericardial effusions. Normal caliber thoracic aorta. Calcification of the aorta and coronary arteries. Mediastinum/Nodes: Esophagus is decompressed. Supraclavicular, mediastinal, and axillary lymph nodes are enlarged. Pretracheal lymph nodes measure up to 1.3 cm short axis dimension. Lymph nodes appear more prominent than prior study, possibly indicating reactive change. Thyroid gland is unremarkable. Lungs/Pleura: Moderate bilateral pleural effusions with basilar atelectasis or consolidation, unchanged since prior study. Changes could indicate pneumonia or compressive atelectasis. No pneumothorax. Upper Abdomen: No acute abnormalities demonstrated. Musculoskeletal: Interval postoperative changes with posterior fixation of the thoracic spine from T5 through T11. Bone destruction and compression of T8 with endplate destruction also of T7 and T9. Changes are consistent with known osteomyelitis/discitis. Of note, the right T9 pedicular screw appears to extend into the disc space. IMPRESSION: 1. Bilateral pleural effusions with basilar atelectasis  or consolidation, similar to prior study. 2. Mediastinal lymphadenopathy appears to be progressing since previous study. Short interval development suggests reactive change. 3. Bone destruction and compression centered at T8 consistent with known discitis and osteomyelitis. 4. Interval postoperative changes with posterior fixation from T5 through T11. The right T9 pedicular screw appears to extend into the disc space. Electronically Signed   By: Burman Nieves M.D.   On: 09/05/2022 21:49   MR BRAIN WO CONTRAST  Result Date: 09/05/2022 CLINICAL DATA:  Altered mental status. EXAM: MRI HEAD WITHOUT CONTRAST TECHNIQUE: Multiplanar, multiecho pulse sequences of the brain and surrounding structures were obtained without intravenous contrast. COMPARISON:  CT head 09/03/2022 FINDINGS: Brain: There is no acute  intracranial hemorrhage, extra-axial fluid collection, or acute infarct. Parenchymal volume is normal. The ventricles are normal in size. Scattered foci of FLAIR signal abnormality in the supratentorial white matter are nonspecific but may reflect sequela of mild chronic small-vessel ischemic change. The pituitary and suprasellar region are normal. There is no mass lesion. There is no mass effect or midline shift. Vascular: Choose Skull and upper cervical spine: Normal marrow signal. Sinuses/Orbits: The paranasal sinuses are clear. The globes and orbits are unremarkable. Other: None. IMPRESSION: No acute intracranial pathology. Electronically Signed   By: Lesia Hausen M.D.   On: 09/05/2022 20:37       Kathlen Mody M.D. [name] 09/06/2022, 2:14 PM  Available via Epic secure chat 7am-7pm After 7 pm, please refer to night coverage provider listed on amion.

## 2022-09-06 NOTE — Inpatient Diabetes Management (Signed)
Inpatient Diabetes Program Recommendations  AACE/ADA: New Consensus Statement on Inpatient Glycemic Control (2015)  Target Ranges:  Prepandial:   less than 140 mg/dL      Peak postprandial:   less than 180 mg/dL (1-2 hours)      Critically ill patients:  140 - 180 mg/dL   Lab Results  Component Value Date   GLUCAP 212 (H) 09/06/2022   HGBA1C 9.1 (H) 07/13/2022    Review of Glycemic Control  Latest Reference Range & Units 09/05/22 07:58 09/05/22 12:33 09/05/22 16:21 09/05/22 21:29 09/06/22 06:14  Glucose-Capillary 70 - 99 mg/dL 409 (H) 811 (H) 914 (H) 288 (H) 212 (H)  (H): Data is abnormally high  Diabetes history: DM2 Outpatient Diabetes medications: Basaglar 40 units QD, Humalog 15 units TID Current orders for Inpatient glycemic control: Semglee 15 units QD, Novolog 0-9 units and 0-5 units QHS  Inpatient Diabetes Program Recommendations:    Please consider:  Semglee 20 units QD Novolog 3 units TID with meals if he consumes at least 50%  Will continue to follow while inpatient.  Thank you, Dulce Sellar, MSN, CDCES Diabetes Coordinator Inpatient Diabetes Program [phone] (team pager from 8a-5p)

## 2022-09-06 NOTE — TOC Progression Note (Signed)
Transition of Care [facility]) - Progression Note    Patient Details  Name: David [name] MRN: [id number] Date of Birth: 06-30-1960  [facility] CM/SW Contact  Baldemar Lenis, [place] Phone Number: 09/06/2022, 3:29 PM  Clinical Narrative:   CSW spoke with patient's spouse, David [name], to discuss disposition. CSW explained working with financial counseling on paperwork, but needing patient's orientation to improve for him to sign. Spouse offered to sign if needed, but it will be better for patient to sign himself, if he is able to improve to do that. CSW also explained process for LOG after it is determined if patient qualifies for Medicaid/Disability. Spouse indicated understanding.   Spouse also asked for an additional letter from the hospital for their eviction process to get it continued, as the patient is unable to attend due to hospitalization. CSW completed updated letter and sent to spouse to send to the lawyer, asked spouse to confirm that she received it. CSW to follow.    Expected Discharge Plan: Skilled Nursing Facility Barriers to Discharge: Continued Medical Work up, Inadequate or no insurance  Expected Discharge Plan and Services   Discharge Planning Services: CM Consult Post Acute Care Choice: Home Health Living arrangements for the past 2 months: Hotel/Motel                                       Social Determinants of Health (SDOH) Interventions SDOH Screenings   Food Insecurity: Patient Unable To Answer (08/23/2022)  Housing: Patient Unable To Answer (08/23/2022)  Transportation Needs: Patient Unable To Answer (08/23/2022)  Utilities: Patient Unable To Answer (08/23/2022)  Depression (PHQ2-9): Low Risk  (11/12/2021)  Stress: Stress Concern Present (09/26/2017)  Tobacco Use: Medium Risk (09/05/2022)    Readmission Risk Interventions    09/06/2021   12:07 PM  Readmission Risk Prevention Plan  Post Dischage Appt Complete  Medication Screening Complete   Transportation Screening Complete

## 2022-09-07 DIAGNOSIS — E876 Hypokalemia: Secondary | ICD-10-CM

## 2022-09-07 DIAGNOSIS — I10 Essential (primary) hypertension: Secondary | ICD-10-CM | POA: Diagnosis not present

## 2022-09-07 DIAGNOSIS — E871 Hypo-osmolality and hyponatremia: Secondary | ICD-10-CM

## 2022-09-07 DIAGNOSIS — G929 Unspecified toxic encephalopathy: Secondary | ICD-10-CM | POA: Insufficient documentation

## 2022-09-07 DIAGNOSIS — R7881 Bacteremia: Secondary | ICD-10-CM

## 2022-09-07 DIAGNOSIS — N179 Acute kidney failure, unspecified: Secondary | ICD-10-CM

## 2022-09-07 DIAGNOSIS — G9341 Metabolic encephalopathy: Secondary | ICD-10-CM

## 2022-09-07 DIAGNOSIS — D62 Acute posthemorrhagic anemia: Secondary | ICD-10-CM

## 2022-09-07 DIAGNOSIS — K5900 Constipation, unspecified: Secondary | ICD-10-CM

## 2022-09-07 DIAGNOSIS — I4819 Other persistent atrial fibrillation: Secondary | ICD-10-CM | POA: Diagnosis not present

## 2022-09-07 DIAGNOSIS — I959 Hypotension, unspecified: Secondary | ICD-10-CM

## 2022-09-07 DIAGNOSIS — E162 Hypoglycemia, unspecified: Secondary | ICD-10-CM | POA: Diagnosis not present

## 2022-09-07 LAB — CBC WITH DIFFERENTIAL/PLATELET
Abs Immature Granulocytes: 0.06 10*3/uL (ref 0.00–0.07)
Basophils Absolute: 0.1 10*3/uL (ref 0.0–0.1)
Basophils Relative: 1 %
Eosinophils Absolute: 0.4 10*3/uL (ref 0.0–0.5)
Eosinophils Relative: 5 %
HCT: 25.8 % — ABNORMAL LOW (ref 39.0–52.0)
Hemoglobin: 8.1 g/dL — ABNORMAL LOW (ref 13.0–17.0)
Immature Granulocytes: 1 %
Lymphocytes Relative: 16 %
Lymphs Abs: 1.3 10*3/uL (ref 0.7–4.0)
MCH: 28 pg (ref 26.0–34.0)
MCHC: 31.4 g/dL (ref 30.0–36.0)
MCV: 89.3 fL (ref 80.0–100.0)
Monocytes Absolute: 0.9 10*3/uL (ref 0.1–1.0)
Monocytes Relative: 10 %
Neutro Abs: 5.5 10*3/uL (ref 1.7–7.7)
Neutrophils Relative %: 67 %
Platelets: 304 10*3/uL (ref 150–400)
RBC: 2.89 MIL/uL — ABNORMAL LOW (ref 4.22–5.81)
RDW: 17.2 % — ABNORMAL HIGH (ref 11.5–15.5)
WBC: 8.2 10*3/uL (ref 4.0–10.5)
nRBC: 0 % (ref 0.0–0.2)

## 2022-09-07 LAB — BASIC METABOLIC PANEL
Anion gap: 7 (ref 5–15)
BUN: 13 mg/dL (ref 6–20)
CO2: 24 mmol/L (ref 22–32)
Calcium: 8.9 mg/dL (ref 8.9–10.3)
Chloride: 98 mmol/L (ref 98–111)
Creatinine, Ser: 1.08 mg/dL (ref 0.61–1.24)
GFR, Estimated: >60 mL/min (ref >60)
Glucose, Bld: 190 mg/dL — ABNORMAL HIGH (ref 70–99)
Potassium: 4.3 mmol/L (ref 3.5–5.1)
Sodium: 129 mmol/L — ABNORMAL LOW (ref 135–145)

## 2022-09-07 LAB — GLUCOSE, CAPILLARY
Glucose-Capillary: 161 mg/dL — ABNORMAL HIGH (ref 70–99)
Glucose-Capillary: 175 mg/dL — ABNORMAL HIGH (ref 70–99)
Glucose-Capillary: 218 mg/dL — ABNORMAL HIGH (ref 70–99)
Glucose-Capillary: 157 mg/dL — ABNORMAL HIGH (ref 70–99)

## 2022-09-07 MED ORDER — OXYCODONE HCL 5 MG PO TABS
5.0000 mg | ORAL_TABLET | Freq: Once | ORAL | Status: AC
  Administered 2022-09-07: 5 mg via ORAL
  Filled 2022-09-07: qty 1

## 2022-09-07 NOTE — Progress Notes (Signed)
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 Chestine Spore on 4/25 persistent A-fib on Eliquis, DM-2, seizure disorder, HTN, HLD and chronic pain return to ED with hypoglycemia to 38.  Patient was hospitalized for disseminated MRSA bacteremia from 4/9-5/17, and discharged on p.o. doxycycline.  Patient was hypoglycemic to 38 when EMS arrived.  His hypoglycemia persisted despite intervention in ED and leading to his rehospitalization.  He also have significant back pain.  MRI thoracic spine concerning for progressive discitis/osteomyelitis at T7-T9, extensive phlegmonous change in the paravertebral soft tissues,  partial collapse of T8 vertebral body with 40% vertebral body height loss and approximately 6 mm bony retropulsion, moderate canal stenosis and mild cord edema.  Neurosurgery and ID consulted.   Patient underwent surgical decompression by  neurosurgery. Palliative medicine helping with pain management.  Therapy recommended CIR, unfortunately, pt does not have caregiver support, hence SNF is now recommended. TOC  on board and assisting with disposition.    Patient remains confused despite adjusting pain meds. His sodium has improved. CT head and MRI brain did not reveal anything acute. Neuro exam is grossly normal except for his mental status. No source of infection.  Adjusting pain meds.    Assessment & Plan:   Principal Problem:   Hypoglycemia Active Problems:   Essential hypertension   Insulin dependent type 2 diabetes mellitus (HCC)   Persistent atrial fibrillation (HCC)   Pure hypercholesterolemia   Cellulitis in diabetic foot (HCC)   Diskitis   Hypokalemia   Hypotension    Constipation   Acute metabolic encephalopathy   Acute postoperative anemia due to expected blood loss  #1 disseminated MRSA bacteremia with T7-T9 discitis and osteomyelitis -MRI done of patient's back was concerning for progressive discitis/osteomyelitis. -Patient noted to have recent hospitalization from 4/9-5/17 and noted to have been discharged on doxycycline. -Patient noted to have previously been treated with daptomycin followed by vancomycin and oritavancin. -Patient seen by ID and neurosurgery during this hospitalization. -Patient underwent surgical intervention 5/29 per neurosurgery, JP drain subsequently removed. -ID following and recommended daptomycin to start 5/29 and oral doxycycline to start on 6/26 2024. -Palliative care following and helping manage patient's pain. -Continue Robaxin 750 mg every 6 hours as needed, Toradol 50 mg IV every 8 hours as needed, Lyrica 75 mg nightly, Lidoderm patch, celecoxib 100 mg twice daily, BuSpar 15 mg twice daily, fentanyl patch 50 mcg every 72 hours, hold codon 10 mg every 4 hours as needed. -Will give her 1 time dose of oxycodone 5 mg p.o. x 1 as patient currently in pain. -Continue bowel regiment.  2.  Right foot osteomyelitis status post right fifth ray amputation 4/30 -Continue current antibiotic regimen. -WBAT. -Will need outpatient follow-up with orthopedics.  3.  Chronic systolic CHF -Continue low-dose Lasix.  4.  Hypomagnesemia/hypokalemia -Repleted.  5.  Hypertension -Continue Coreg, Lasix. -Pain likely playing a role in patient's elevated blood pressure.  6.  Acute metabolic encephalopathy -Felt secondary to pain meds versus hyponatremia versus withdrawal. -It is noted that patient did have some intermittent hallucinations. -Ammonia level at 38. -CT head without any acute abnormalities. -MRI brain negative for acute abnormalities. -Improving clinically.  7.  Persistent A-fib -Continue  Coreg for rate control. -Eliquis  on hold. -Per prior physician anticoagulation to be held for 1 week per neurosurgery. -Would likely be able to start IV heparin versus Eliquis in the next 24 hours. -Will need to discuss with neurosurgery prior to starting anticoagulation.  8.  Moderate pleural effusion -Stable. -On Lasix.  9.  Constipation -Resolved. -Continue current bowel regiment.  10.  Hypotension -Resolved.  11.  Postop acute blood loss anemia -Likely loss postoperatively. -Started posttransfusion 1 unit PRBCs hemoglobin currently stable at 8.1. -Follow H&H. -Transfusion for showed hemoglobin < 7.  12.  AKI -Felt likely secondary to prerenal azotemia secondary to hypotension and dehydration. -Resolved.  13.  Hyponatremia -??  Etiology. -Different show includes secondary to volume overload versus poor oral intake. -SIADH ruled out. -Urine sodium less than 10, TSH within normal limits, serum osmolality 287, serum cortisol is 12. -Sodium level currently fluctuating and currently at 129. -Follow.   DVT prophylaxis: SCDs Code Status: Full Family Communication: Updated patient.  No family at bedside. Disposition: SNF  Status is: Inpatient Remains inpatient appropriate because: Severity of illness   Consultants:  ID: Dr. Thedore Mins 08/23/2022 Wound care RN, Ria Bush 08/24/2022 Palliative care: Richrd Humbles 08/26/2022  Procedures:  CT angiogram chest 08/22/2022 CT chest 09/05/2022 CT head 09/03/2022 Chest x-ray 08/20/2022, 09/01/2022, PICC line placement 09/01/2022 Plan films of the T-spine 08/30/2022 MRI of the T-spine 08/22/2022 MRI of the brain 09/05/2022 T5-6, T6-7, T7-8, T8-9, T9-10, T10-11 posterior/posterior lateral arthrodesis/transpedicular decompression with partial corpectomy, left T8/T8-9 laminectomy, bilateral facetectomy/segmental instrumentation with percutaneously placed pedicle screw and rod construct at T5 to 6-7 to 8-9 to 10-11/Harvest of local autograft/use of morselized  allograft/intraoperative neuronavigation with [facility]/intraoperative CT-Per neurosurgery: Dr. Maisie Fus 08/30/2022  Antimicrobials:  Anti-infectives (From admission, onward)    Start     Dose/Rate Route Frequency Ordered Stop   09/28/22 1000  doxycycline (VIBRA-TABS) tablet 100 mg        100 mg Oral Every 12 hours 09/01/22 1113     08/31/22 2000  DAPTOmycin (CUBICIN) 650 mg in sodium chloride 0.9 % IVPB        8 mg/kg  83.3 kg 126 mL/hr over 30 Minutes Intravenous Daily 08/31/22 1053 09/27/22 2359   08/30/22 2300  vancomycin (VANCOREADY) IVPB 750 mg/150 mL  Status:  Discontinued        750 mg 150 mL/hr over 60 Minutes Intravenous Every 12 hours 08/30/22 1150 08/31/22 1053   08/30/22 1712  vancomycin (VANCOCIN) powder  Status:  Discontinued          As needed 08/30/22 1714 08/30/22 1756   08/30/22 1142  ceFAZolin (ANCEF) 2-4 GM/100ML-% IVPB  Status:  Discontinued       Note to Pharmacy: [facility], [place]: cabinet override      08/30/22 1142 08/30/22 1300   08/30/22 0600  ceFAZolin (ANCEF) IVPB 2g/100 mL premix        2 g 200 mL/hr over 30 Minutes Intravenous On call to O.R. 08/29/22 1848 08/30/22 1401   08/27/22 1041  vancomycin (VANCOCIN) IVPB 1000 mg/200 mL premix  Status:  Discontinued        1,000 mg 200 mL/hr over 60 Minutes Intravenous Every 12 hours 08/27/22 0802 08/30/22 1150   08/26/22 2300  vancomycin (VANCOREADY) IVPB 1250 mg/250 mL  Status:  Discontinued        1,250 mg 166.7 mL/hr over 90 Minutes Intravenous Every 24 hours 08/26/22 1014 08/27/22 0802   08/23/22 2200  vancomycin (VANCOREADY) IVPB 750 mg/150 mL  Status:  Discontinued        750 mg 150 mL/hr over 60 Minutes Intravenous Every 12 hours 08/23/22 0938 08/26/22 1014   08/23/22 1030  vancomycin (VANCOREADY) IVPB 1500 mg/300 mL        1,500 mg 150 mL/hr over 120 Minutes Intravenous  Once 08/23/22 0938 08/23/22 1312   08/20/22 2200  doxycycline (VIBRA-TABS) tablet 100 mg  Status:  Discontinued        100 mg Oral 2 times  daily 08/20/22 1737 08/23/22 0927         Subjective: Sitting up in recliner.  Denies any chest pain.  No shortness of breath.  No abdominal pain.  Complaining of some right-sided rib and back pain.  States pain was better controlled yesterday however currently in pain and cannot get any pain medications to 130.  Objective: Vitals:   09/07/22 0623 09/07/22 0727 09/07/22 1147 09/07/22 1536  BP: (!) 146/80 (!) 163/89 (!) 148/88 (!) 156/78  Pulse: 60 (!) 55  61  Resp: 17 17 18 15   Temp: 97.9 F (36.6 C) 97.9 F (36.6 C) 97.6 F (36.4 C) 98.1 F (36.7 C)  TempSrc: Oral Oral Oral Oral  SpO2: 99% 98% 99% 100%  Weight:      Height:        Intake/Output Summary (Last 24 hours) at 09/07/2022 1847 Last data filed at 09/07/2022 1500 Gross per 24 hour  Intake --  Output 3000 ml  Net -3000 ml   Filed Weights   08/20/22 1821 08/23/22 2307  Weight: 77.8 kg 83.3 kg    Examination:  General exam: Appears calm and comfortable. TLSO brace on.  Respiratory system: Clear to auscultation.  No wheezes, no crackles, no rhonchi.  Fair air movement.  Speaking in full sentences.  Respiratory effort normal. Cardiovascular system: S1 & S2 heard, RRR. No JVD, murmurs, rubs, gallops or clicks. No pedal edema. Gastrointestinal system: Abdomen is nondistended, soft and nontender. No organomegaly or masses felt. Normal bowel sounds heard. Central nervous system: Alert and oriented. No focal neurological deficits. Extremities: Symmetric 5 x 5 power. Skin: No rashes, lesions or ulcers Psychiatry: Judgement and insight appear normal. Mood & affect appropriate.     Data Reviewed: I have personally reviewed following labs and imaging studies  CBC: Recent Labs  Lab 09/02/22 0404 09/03/22 0421 09/04/22 0505 09/07/22 0530  WBC 9.0 9.1 9.3 8.2  NEUTROABS 6.8 6.7 6.8 5.5  HGB 7.1* 8.0* 8.2* 8.1*  HCT 21.3* 24.5* 24.9* 25.8*  MCV 88.0 87.2 88.0 89.3  PLT 215 231 275 304    Basic Metabolic  Panel: Recent Labs  Lab 09/02/22 0404 09/02/22 1754 09/03/22 1036 09/04/22 0505 09/05/22 0822 09/07/22 0530  NA 123* 121* 126* 128* 132* 129*  K 3.9 3.9 4.2 4.3 4.0 4.3  CL 94* 94* 98 99 101 98  CO2 20* 20* 21* 21* 23 24  GLUCOSE 259* 223* 209* 263* 218* 190*  BUN 19 21* 20 17 13 13   CREATININE 1.27* 1.23 1.12 1.10 0.94 1.08  CALCIUM 8.7* 8.4* 8.7* 9.1 9.2 8.9  MG 2.0  --   --   --   --   --     GFR: Estimated Creatinine Clearance: 79.8 mL/min (by C-G formula based on SCr of 1.08 mg/dL).  Liver Function Tests: No results for input(s): "AST", "ALT", "ALKPHOS", "BILITOT", "PROT", "ALBUMIN" in the last 168 hours.  CBG: Recent Labs  Lab 09/06/22 1602 09/06/22 2057 09/07/22 0622 09/07/22 1140 09/07/22 1724  GLUCAP 182* 157* 161* 175* 218*     Recent Results (from the past 240 hour(s))  Aerobic/Anaerobic Culture w Gram Stain (surgical/deep wound)     Status: None   Collection Time: 08/30/22  4:31 PM   Specimen: Back; Wound  Result Value Ref Range Status   Specimen Description WOUND  Final   Special Requests THORACIC EIGHT TEST VANCO  Final   Gram Stain   Final    RARE WBC PRESENT,BOTH PMN AND MONONUCLEAR RARE GRAM POSITIVE COCCI IN PAIRS    Culture   Final    RARE METHICILLIN RESISTANT STAPHYLOCOCCUS AUREUS NO ANAEROBES ISOLATED Performed at [facility], [address] [place], [place] [address]    Report Status 09/04/2022 FINAL  Final   Organism ID, Bacteria METHICILLIN RESISTANT STAPHYLOCOCCUS AUREUS  Final      Susceptibility   Methicillin resistant staphylococcus aureus - MIC*    CIPROFLOXACIN >=8 RESISTANT Resistant     ERYTHROMYCIN >=8 RESISTANT Resistant     GENTAMICIN <=0.5 SENSITIVE Sensitive     OXACILLIN >=4 RESISTANT Resistant     TETRACYCLINE <=1 SENSITIVE Sensitive     VANCOMYCIN <=0.5 SENSITIVE Sensitive     TRIMETH/SULFA >=320 RESISTANT Resistant     CLINDAMYCIN <=0.25 SENSITIVE Sensitive     RIFAMPIN <=0.5 SENSITIVE Sensitive      Inducible Clindamycin NEGATIVE Sensitive     LINEZOLID 2 SENSITIVE Sensitive     * RARE METHICILLIN RESISTANT STAPHYLOCOCCUS AUREUS         Radiology Studies: No results found.      Scheduled Meds:  sodium chloride   Intravenous Once   acetaminophen  1,000 mg Oral TID   atorvastatin  80 mg Oral Daily   busPIRone  15 mg Oral BID   carvedilol  3.125 mg Oral BID   celecoxib  100 mg Oral BID   Chlorhexidine Gluconate Cloth  6 each Topical Daily   digoxin  125 mcg Oral Daily   [START ON 09/28/2022] doxycycline  100 mg Oral Q12H   DULoxetine  20 mg Oral Daily   famotidine  20 mg Oral BID   feeding supplement  237 mL Oral BID BM   fentaNYL  1 patch Transdermal Q48H   fluticasone  1 spray Each Nare Daily   furosemide  20 mg Oral Daily   insulin aspart  0-5 Units Subcutaneous QHS   insulin aspart  0-9 Units Subcutaneous TID WC   insulin aspart  2 Units Subcutaneous TID WC   insulin glargine-yfgn  20 Units Subcutaneous Daily   lidocaine  1 patch Transdermal Q24H   magnesium oxide  400 mg Oral BID   oxymetazoline  1 spray Right Nare BID   pantoprazole  40 mg Oral Daily   polyethylene glycol  17 g Oral BID   pregabalin  75 mg Oral QHS   senna-docusate  1 tablet Oral BID   sodium chloride flush  10-40 mL Intracatheter Q12H   sodium chloride flush  3 mL Intravenous Q12H   Continuous Infusions:  sodium chloride Stopped (09/01/22 1621)   DAPTOmycin (CUBICIN) 650 mg in sodium chloride 0.9 % IVPB 650 mg (09/07/22 1354)     LOS: 15 days    Time spent: 40 minutes    Ramiro Harvest, MD [facility]   To contact the attending provider between 7A-7P or the covering provider during after hours 7P-7A, please log into the web site [email or web address] and access using universal [place] password for that web site. If  you do not have the password, please call the hospital operator.  09/07/2022, 6:47 PM

## 2022-09-07 NOTE — Progress Notes (Signed)
Physical Therapy Treatment Patient Details Name: David [name] MRN: [id number] DOB: 10-01-1960 Today's Date: 09/07/2022   History of Present Illness Pt is a 62 y.o. male who presented 08/20/22  with hypoglycemia. 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 5/28 surgical repair: T5-11 posterior/posterolateral arthrodesis  & Transpedicular decompression with partial corpectomy, left T8 & T8-9 laminectomy, bilateral facetectomy. 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    PT Comments    Pt seen for PT tx with pt agreeable to participate within tolerance while awaiting more pain medication after PT provides encouragement.  Pt already with TLSO donned. PT educates pt on lateral scoot drop arm recliner>bed with pt able to complete with min assist & ongoing educational cuing. Pt fatigued afterwards but pleased he was able to transfer with less assistance. Pt assisted back to bed & left in care of nurse who arrived to administer pain meds.   Recommendations for follow up therapy are one component of a multi-disciplinary discharge planning process, led by the attending physician.  Recommendations may be updated based on patient status, additional functional criteria and insurance authorization.  Follow Up Recommendations  Can patient physically be transported by private vehicle: No    Assistance Recommended at Discharge Frequent or constant Supervision/Assistance  Patient can return home with the following Assist for transportation;Two people to help with walking and/or transfers;A lot of help with bathing/dressing/bathroom;Assistance with cooking/housework;Direct supervision/assist for  financial management;Direct supervision/assist for medications management   Equipment Recommendations  Rolling walker (2 wheels);Wheelchair cushion (measurements PT);Wheelchair (measurements PT);BSC/3in1 (drop arm BSC)    Recommendations for Other Services Rehab consult     Precautions / Restrictions Precautions Precautions: Fall;Back Required Braces or Orthoses: Spinal Brace Spinal Brace: Thoracolumbosacral orthotic;Applied in sitting position Other Brace: for use when ambulating Restrictions Weight Bearing Restrictions: Yes RLE Weight Bearing: Weight bearing as tolerated RLE Partial Weight Bearing Percentage or Pounds: heel weight bearing Other Position/Activity Restrictions: with Darco shoe     Mobility  Bed Mobility Overal bed mobility: Needs Assistance   Rolling: Min assist       Sit to sidelying: Mod assist (assistance to elevate BLE onto bed.) General bed mobility comments: Pt able to roll L<>R with PT providing assistance for LLE positioning to increase ease of rolling R.    Transfers Overall transfer level: Needs assistance   Transfers: Bed to chair/wheelchair/BSC            Lateral/Scoot Transfers: Min assist General transfer comment: Pt completed lateral scoot drop arm recliner>bed on R. PT educates pt on head/hips relationship, BUE & BLE placement & repositioning & overall sequencing. Pt is able to complete scoots without assistance but PT provides blocking to prevent anterior LOB. Pt fatigued after transfer.    Ambulation/Gait                   Stairs             Wheelchair Mobility    Modified Rankin (Stroke Patients Only)       Balance Overall balance assessment: Needs assistance Sitting-balance support: Bilateral upper extremity supported, Feet supported Sitting balance-Leahy Scale: Fair  Cognition Arousal/Alertness: Awake/alert Behavior During Therapy: WFL for tasks  assessed/performed                                   General Comments: Pt agreeable to participation to his tolerance with encouragement despite current pain levels. Pt reports he's motivated to participate and eager to d/c. Pt follows simple commands throughout session.        Exercises      General Comments        Pertinent Vitals/Pain Pain Assessment Pain Assessment: Faces Pain Score: 9  Pain Location: Pt reports pt in lateral side of R abdomen, states it feels like a hole and every time he breathes air moves through it - PT did not observe any issues with skin. Pain Descriptors / Indicators: Discomfort, Grimacing, Guarding Pain Intervention(s): Monitored during session, Patient requesting pain meds-RN notified, Repositioned, Limited activity within patient's tolerance    Home Living                          Prior Function            PT Goals (current goals can now be found in the care plan section) Acute Rehab PT Goals Patient Stated Goal: to improve and reduce pain PT Goal Formulation: With patient Time For Goal Achievement: 09/14/22 Potential to Achieve Goals: Good Progress towards PT goals: Progressing toward goals    Frequency    Min 5X/week      PT Plan Current plan remains appropriate    Co-evaluation              AM-PAC PT "6 Clicks" Mobility   Outcome Measure  Help needed turning from your back to your side while in a flat bed without using bedrails?: A Little Help needed moving from lying on your back to sitting on the side of a flat bed without using bedrails?: A Lot Help needed moving to and from a bed to a chair (including a wheelchair)?: A Little Help needed standing up from a chair using your arms (e.g., wheelchair or bedside chair)?: Total Help needed to walk in hospital room?: Total Help needed climbing 3-5 steps with a railing? : Total 6 Click Score: 11    End of Session Equipment Utilized During  Treatment: Back brace Activity Tolerance: Patient tolerated treatment well;Patient limited by pain Patient left: in bed;with nursing/sitter in room;with call bell/phone within reach Nurse Communication: Patient requests pain meds PT Visit Diagnosis: Difficulty in walking, not elsewhere classified (R26.2);Other abnormalities of gait and mobility (R26.89);Muscle weakness (generalized) (M62.81);Pain;Unsteadiness on feet (R26.81) Pain - Right/Left: Right Pain - part of body:  (side)     Time: 1610-9604 PT Time Calculation (min) (ACUTE ONLY): 15 min  Charges:  $Therapeutic Activity: 8-22 mins                     Aleda Grana, PT, DPT 09/07/22, 1:58 PM   Sandi Mariscal 09/07/2022, 1:57 PM

## 2022-09-07 NOTE — Progress Notes (Signed)
This chaplain attempted a F/U spiritual care visit. The Pt. is sleeping with headphones on and pillow on his chest. The chaplain is appreciative of the RN-Shelby's update. This chaplain will attempt a revisit.  Chaplain Stephanie Acre [phone]

## 2022-09-08 ENCOUNTER — Ambulatory Visit: Payer: Self-pay | Admitting: Infectious Disease

## 2022-09-08 DIAGNOSIS — E876 Hypokalemia: Secondary | ICD-10-CM | POA: Diagnosis not present

## 2022-09-08 DIAGNOSIS — E871 Hypo-osmolality and hyponatremia: Secondary | ICD-10-CM

## 2022-09-08 DIAGNOSIS — N179 Acute kidney failure, unspecified: Secondary | ICD-10-CM

## 2022-09-08 DIAGNOSIS — G9341 Metabolic encephalopathy: Secondary | ICD-10-CM

## 2022-09-08 DIAGNOSIS — E162 Hypoglycemia, unspecified: Secondary | ICD-10-CM | POA: Diagnosis not present

## 2022-09-08 DIAGNOSIS — I4819 Other persistent atrial fibrillation: Secondary | ICD-10-CM | POA: Diagnosis not present

## 2022-09-08 DIAGNOSIS — I10 Essential (primary) hypertension: Secondary | ICD-10-CM | POA: Diagnosis not present

## 2022-09-08 DIAGNOSIS — B9562 Methicillin resistant Staphylococcus aureus infection as the cause of diseases classified elsewhere: Secondary | ICD-10-CM

## 2022-09-08 DIAGNOSIS — R7881 Bacteremia: Secondary | ICD-10-CM

## 2022-09-08 DIAGNOSIS — K5900 Constipation, unspecified: Secondary | ICD-10-CM

## 2022-09-08 DIAGNOSIS — I959 Hypotension, unspecified: Secondary | ICD-10-CM

## 2022-09-08 DIAGNOSIS — D62 Acute posthemorrhagic anemia: Secondary | ICD-10-CM

## 2022-09-08 LAB — GLUCOSE, CAPILLARY
Glucose-Capillary: 151 mg/dL — ABNORMAL HIGH (ref 70–99)
Glucose-Capillary: 126 mg/dL — ABNORMAL HIGH (ref 70–99)
Glucose-Capillary: 97 mg/dL (ref 70–99)
Glucose-Capillary: 96 mg/dL (ref 70–99)

## 2022-09-08 LAB — CBC
HCT: 27.3 % — ABNORMAL LOW (ref 39.0–52.0)
Hemoglobin: 8.8 g/dL — ABNORMAL LOW (ref 13.0–17.0)
MCH: 28.6 pg (ref 26.0–34.0)
MCHC: 32.2 g/dL (ref 30.0–36.0)
MCV: 88.6 fL (ref 80.0–100.0)
Platelets: 316 10*3/uL (ref 150–400)
RBC: 3.08 MIL/uL — ABNORMAL LOW (ref 4.22–5.81)
RDW: 17.1 % — ABNORMAL HIGH (ref 11.5–15.5)
WBC: 9.5 10*3/uL (ref 4.0–10.5)
nRBC: 0 % (ref 0.0–0.2)

## 2022-09-08 LAB — BASIC METABOLIC PANEL
Anion gap: 9 (ref 5–15)
BUN: 17 mg/dL (ref 6–20)
CO2: 23 mmol/L (ref 22–32)
Calcium: 8.9 mg/dL (ref 8.9–10.3)
Chloride: 94 mmol/L — ABNORMAL LOW (ref 98–111)
Creatinine, Ser: 1.13 mg/dL (ref 0.61–1.24)
GFR, Estimated: >60 mL/min (ref >60)
Glucose, Bld: 165 mg/dL — ABNORMAL HIGH (ref 70–99)
Potassium: 4.6 mmol/L (ref 3.5–5.1)
Sodium: 126 mmol/L — ABNORMAL LOW (ref 135–145)

## 2022-09-08 LAB — MAGNESIUM: Magnesium: 1.8 mg/dL (ref 1.7–2.4)

## 2022-09-08 LAB — CK: Total CK: 28 U/L — ABNORMAL LOW (ref 49–397)

## 2022-09-08 MED ORDER — SODIUM CHLORIDE 0.9 % IV SOLN: INTRAVENOUS | Status: AC

## 2022-09-08 MED ORDER — APIXABAN 5 MG PO TABS
5.0000 mg | ORAL_TABLET | Freq: Two times a day (BID) | ORAL | Status: DC
  Administered 2022-09-08 – 2022-10-28 (×101): 5 mg via ORAL
  Filled 2022-09-08 (×101): qty 1

## 2022-09-08 NOTE — Progress Notes (Signed)
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 Chestine Spore on 4/25 persistent A-fib on Eliquis, DM-2, seizure disorder, HTN, HLD and chronic pain return to ED with hypoglycemia to 38.  Patient was hospitalized for disseminated MRSA bacteremia from 4/9-5/17, and discharged on p.o. doxycycline.  Patient was hypoglycemic to 38 when EMS arrived.  His hypoglycemia persisted despite intervention in ED and leading to his rehospitalization.  He also have significant back pain.  MRI thoracic spine concerning for progressive discitis/osteomyelitis at T7-T9, extensive phlegmonous change in the paravertebral soft tissues,  partial collapse of T8 vertebral body with 40% vertebral body height loss and approximately 6 mm bony retropulsion, moderate canal stenosis and mild cord edema.  Neurosurgery and ID consulted.   Patient underwent surgical decompression by  neurosurgery. Palliative medicine helping with pain management.  Therapy recommended CIR, unfortunately, pt does not have caregiver support, hence SNF is now recommended. TOC  on board and assisting with disposition.    Patient remains confused despite adjusting pain meds. His sodium has improved. CT head and MRI brain did not reveal anything acute. Neuro exam is grossly normal except for his mental status. No source of infection.  Adjusting pain meds.    Assessment & Plan:   Principal Problem:   Hypoglycemia Active Problems:   Essential hypertension   Insulin dependent type 2 diabetes mellitus (HCC)   Persistent atrial fibrillation (HCC)   Pure hypercholesterolemia   Cellulitis in diabetic foot (HCC)   Diskitis   Hypokalemia   Hypotension    Constipation   Acute metabolic encephalopathy   Acute postoperative anemia due to expected blood loss  #1 disseminated MRSA bacteremia with T7-T9 discitis and osteomyelitis -MRI done of patient's back was concerning for progressive discitis/osteomyelitis. -Patient noted to have recent hospitalization from 4/9-5/17 and noted to have been discharged on doxycycline. -Patient noted to have previously been treated with daptomycin followed by vancomycin and oritavancin. -Patient seen by ID and neurosurgery during this hospitalization. -Patient underwent surgical intervention 5/29 per neurosurgery, JP drain subsequently removed. -ID following and recommended daptomycin to start 5/29 and oral doxycycline to start on 6/26 2024. -Palliative care following and helping manage patient's pain. -Continue Robaxin 750 mg every 6 hours as needed, Toradol 50 mg IV every 8 hours as needed, Lyrica 75 mg nightly, Lidoderm patch, celecoxib 100 mg twice daily, BuSpar 15 mg twice daily, fentanyl patch 50 mcg every 72 hours, oxycodone 10 mg every 4 hours as needed. -Continue bowel regiment.  2.  Right foot osteomyelitis status post right fifth ray amputation 4/30 -Continue current antibiotic regimen. -WBAT. -Will need outpatient follow-up with orthopedics.  3.  Chronic systolic CHF -Hold Lasix today secondary to hyponatremia.   4.  Hypomagnesemia/hypokalemia -Repleted.  5.  Hypertension -Continue Coreg.   -Hold Lasix secondary to hyponatremia.  -Pain likely playing a role in patient's elevated blood pressure.  6.  Acute metabolic encephalopathy -Felt secondary to pain meds versus hyponatremia versus withdrawal. -It is noted that patient did have some intermittent hallucinations. -Ammonia level at 38. -CT head without any acute abnormalities. -MRI brain negative for acute abnormalities. -Clinical improvement.   -Likely getting close to baseline.    7.  Persistent A-fib -  Coreg for rate control.  -Per  prior physician anticoagulation to be held for 1 week per neurosurgery. -Eliquis resumed today.  8.  Moderate pleural effusion -Stable. -Hold Lasix today due to hyponatremia.   9.  Constipation -Resolved. -Continue current bowel regiment.  10.  Hypotension -Resolved.  11.  Postop acute blood loss anemia -Likely loss postoperatively. -Started posttransfusion 1 unit PRBCs hemoglobin currently stable at 8.8. -Follow H&H. -Transfusion for showed hemoglobin < 7.  12.  AKI -Felt likely secondary to prerenal azotemia secondary to hypotension and dehydration. -Resolved.  13.  Hyponatremia -??  Etiology. -Different show includes secondary to volume overload versus poor oral intake. -SIADH ruled out. -Urine sodium less than 10, TSH within normal limits, serum osmolality 287, serum cortisol is 12. -Sodium level currently fluctuating and currently at 126. -Hold Lasix today. -IV fluids. -Repeat labs in the AM.   DVT prophylaxis: SCDs Code Status: Full Family Communication: Updated patient.  No family at bedside. Disposition: SNF  Status is: Inpatient Remains inpatient appropriate because: Severity of illness   Consultants:  ID: Dr. Thedore Mins 08/23/2022 Wound care RN, Ria Bush 08/24/2022 Palliative care: Richrd Humbles 08/26/2022  Procedures:  CT angiogram chest 08/22/2022 CT chest 09/05/2022 CT head 09/03/2022 Chest x-ray 08/20/2022, 09/01/2022, PICC line placement 09/01/2022 Plan films of the T-spine 08/30/2022 MRI of the T-spine 08/22/2022 MRI of the brain 09/05/2022 T5-6, T6-7, T7-8, T8-9, T9-10, T10-11 posterior/posterior lateral arthrodesis/transpedicular decompression with partial corpectomy, left T8/T8-9 laminectomy, bilateral facetectomy/segmental instrumentation with percutaneously placed pedicle screw and rod construct at T5 to 6-7 to 8-9 to 10-11/Harvest of local autograft/use of morselized allograft/intraoperative neuronavigation with [facility]/intraoperative CT-Per  neurosurgery: Dr. Maisie Fus 08/30/2022  Antimicrobials:  Anti-infectives (From admission, onward)    Start     Dose/Rate Route Frequency Ordered Stop   09/28/22 1000  doxycycline (VIBRA-TABS) tablet 100 mg        100 mg Oral Every 12 hours 09/01/22 1113     08/31/22 2000  DAPTOmycin (CUBICIN) 650 mg in sodium chloride 0.9 % IVPB        8 mg/kg  83.3 kg 126 mL/hr over 30 Minutes Intravenous Daily 08/31/22 1053 09/27/22 2359   08/30/22 2300  vancomycin (VANCOREADY) IVPB 750 mg/150 mL  Status:  Discontinued        750 mg 150 mL/hr over 60 Minutes Intravenous Every 12 hours 08/30/22 1150 08/31/22 1053   08/30/22 1712  vancomycin (VANCOCIN) powder  Status:  Discontinued          As needed 08/30/22 1714 08/30/22 1756   08/30/22 1142  ceFAZolin (ANCEF) 2-4 GM/100ML-% IVPB  Status:  Discontinued       Note to Pharmacy: [facility], [place]: cabinet override      08/30/22 1142 08/30/22 1300   08/30/22 0600  ceFAZolin (ANCEF) IVPB 2g/100 mL premix        2 g 200 mL/hr over 30 Minutes Intravenous On call to O.R. 08/29/22 1848 08/30/22 1401   08/27/22 1041  vancomycin (VANCOCIN) IVPB 1000 mg/200 mL premix  Status:  Discontinued        1,000 mg 200 mL/hr over 60 Minutes Intravenous Every 12 hours 08/27/22 0802 08/30/22 1150   08/26/22 2300  vancomycin (VANCOREADY) IVPB 1250 mg/250 mL  Status:  Discontinued        1,250 mg 166.7 mL/hr over 90 Minutes Intravenous Every 24 hours 08/26/22 1014 08/27/22 0802   08/23/22 2200  vancomycin (VANCOREADY) IVPB 750 mg/150 mL  Status:  Discontinued  750 mg 150 mL/hr over 60 Minutes Intravenous Every 12 hours 08/23/22 0938 08/26/22 1014   08/23/22 1030  vancomycin (VANCOREADY) IVPB 1500 mg/300 mL        1,500 mg 150 mL/hr over 120 Minutes Intravenous  Once 08/23/22 0938 08/23/22 1312   08/20/22 2200  doxycycline (VIBRA-TABS) tablet 100 mg  Status:  Discontinued        100 mg Oral 2 times daily 08/20/22 1737 08/23/22 0927         Subjective: Sitting up at  the side of the bed working with therapy.  Denies any chest pain or shortness of breath.  Feels pain is controlled on current regimen if he receives pain medications on time.    Objective: Vitals:   09/07/22 2026 09/07/22 2342 09/08/22 0446 09/08/22 0915  BP: (!) 147/85 (!) 163/91 139/76 (!) 165/95  Pulse: (!) 58 (!) 54 (!) 56 64  Resp: 18 18 16 19   Temp: 97.8 F (36.6 C) 97.8 F (36.6 C) 98.2 F (36.8 C) 98 F (36.7 C)  TempSrc: Oral Oral Oral Oral  SpO2: 97% 96% 95% 96%  Weight:      Height:        Intake/Output Summary (Last 24 hours) at 09/08/2022 1210 Last data filed at 09/08/2022 1003 Gross per 24 hour  Intake --  Output 3600 ml  Net -3600 ml    Filed Weights   08/20/22 1821 08/23/22 2307  Weight: 77.8 kg 83.3 kg    Examination:  General exam: NAD. Honeycomb dressing on back c/d/i Respiratory system: Lungs clear to auscultation bilaterally.  No wheezes, no crackles, no rhonchi.  Fair air movement.  Speaking in full sentences.   Cardiovascular system: Regular rate rhythm no murmurs rubs or gallops.  No JVD.  No lower extremity edema. Gastrointestinal system: Abdomen is soft, nontender, nondistended, positive bowel sounds.  No rebound.  No guarding.  Central nervous system: Alert and oriented. No focal neurological deficits. Extremities: Symmetric 5 x 5 power. Skin: No rashes, lesions or ulcers Psychiatry: Judgement and insight appear normal. Mood & affect appropriate.     Data Reviewed: I have personally reviewed following labs and imaging studies  CBC: Recent Labs  Lab 09/02/22 0404 09/03/22 0421 09/04/22 0505 09/07/22 0530 09/08/22 0410  WBC 9.0 9.1 9.3 8.2 9.5  NEUTROABS 6.8 6.7 6.8 5.5  --   HGB 7.1* 8.0* 8.2* 8.1* 8.8*  HCT 21.3* 24.5* 24.9* 25.8* 27.3*  MCV 88.0 87.2 88.0 89.3 88.6  PLT 215 231 275 304 316     Basic Metabolic Panel: Recent Labs  Lab 09/02/22 0404 09/02/22 1754 09/03/22 1036 09/04/22 0505 09/05/22 0822 09/07/22 0530  09/08/22 0410  NA 123*   < > 126* 128* 132* 129* 126*  K 3.9   < > 4.2 4.3 4.0 4.3 4.6  CL 94*   < > 98 99 101 98 94*  CO2 20*   < > 21* 21* 23 24 23   GLUCOSE 259*   < > 209* 263* 218* 190* 165*  BUN 19   < > 20 17 13 13 17   CREATININE 1.27*   < > 1.12 1.10 0.94 1.08 1.13  CALCIUM 8.7*   < > 8.7* 9.1 9.2 8.9 8.9  MG 2.0  --   --   --   --   --  1.8   < > = values in this interval not displayed.     GFR: Estimated Creatinine Clearance: 76.3 mL/min (by C-G formula based on  SCr of 1.13 mg/dL).  Liver Function Tests: No results for input(s): "AST", "ALT", "ALKPHOS", "BILITOT", "PROT", "ALBUMIN" in the last 168 hours.  CBG: Recent Labs  Lab 09/07/22 0622 09/07/22 1140 09/07/22 1724 09/07/22 2118 09/08/22 0609  GLUCAP 161* 175* 218* 157* 151*      Recent Results (from the past 240 hour(s))  Aerobic/Anaerobic Culture w Gram Stain (surgical/deep wound)     Status: None   Collection Time: 08/30/22  4:31 PM   Specimen: Back; Wound  Result Value Ref Range Status   Specimen Description WOUND  Final   Special Requests THORACIC EIGHT TEST VANCO  Final   Gram Stain   Final    RARE WBC PRESENT,BOTH PMN AND MONONUCLEAR RARE GRAM POSITIVE COCCI IN PAIRS    Culture   Final    RARE METHICILLIN RESISTANT STAPHYLOCOCCUS AUREUS NO ANAEROBES ISOLATED Performed at [facility], [address], [place], [place] [address]    Report Status 09/04/2022 FINAL  Final   Organism ID, Bacteria METHICILLIN RESISTANT STAPHYLOCOCCUS AUREUS  Final      Susceptibility   Methicillin resistant staphylococcus aureus - MIC*    CIPROFLOXACIN >=8 RESISTANT Resistant     ERYTHROMYCIN >=8 RESISTANT Resistant     GENTAMICIN <=0.5 SENSITIVE Sensitive     OXACILLIN >=4 RESISTANT Resistant     TETRACYCLINE <=1 SENSITIVE Sensitive     VANCOMYCIN <=0.5 SENSITIVE Sensitive     TRIMETH/SULFA >=320 RESISTANT Resistant     CLINDAMYCIN <=0.25 SENSITIVE Sensitive     RIFAMPIN <=0.5 SENSITIVE Sensitive      Inducible Clindamycin NEGATIVE Sensitive     LINEZOLID 2 SENSITIVE Sensitive     * RARE METHICILLIN RESISTANT STAPHYLOCOCCUS AUREUS         Radiology Studies: No results found.      Scheduled Meds:  sodium chloride   Intravenous Once   acetaminophen  1,000 mg Oral TID   apixaban  5 mg Oral BID   atorvastatin  80 mg Oral Daily   busPIRone  15 mg Oral BID   carvedilol  3.125 mg Oral BID   celecoxib  100 mg Oral BID   Chlorhexidine Gluconate Cloth  6 each Topical Daily   digoxin  125 mcg Oral Daily   [START ON 09/28/2022] doxycycline  100 mg Oral Q12H   DULoxetine  20 mg Oral Daily   famotidine  20 mg Oral BID   feeding supplement  237 mL Oral BID BM   fentaNYL  1 patch Transdermal Q48H   fluticasone  1 spray Each Nare Daily   insulin aspart  0-5 Units Subcutaneous QHS   insulin aspart  0-9 Units Subcutaneous TID WC   insulin aspart  2 Units Subcutaneous TID WC   insulin glargine-yfgn  20 Units Subcutaneous Daily   lidocaine  1 patch Transdermal Q24H   magnesium oxide  400 mg Oral BID   pantoprazole  40 mg Oral Daily   polyethylene glycol  17 g Oral BID   pregabalin  75 mg Oral QHS   senna-docusate  1 tablet Oral BID   sodium chloride flush  10-40 mL Intracatheter Q12H   sodium chloride flush  3 mL Intravenous Q12H   Continuous Infusions:  sodium chloride Stopped (09/01/22 1621)   sodium chloride 75 mL/hr at 09/08/22 0958   DAPTOmycin (CUBICIN) 650 mg in sodium chloride 0.9 % IVPB Stopped (09/07/22 1430)     LOS: 16 days    Time spent: 40 minutes    Reuel Boom  Janee Morn, MD [facility]   To contact the attending provider between 7A-7P or the covering provider during after hours 7P-7A, please log into the web site [email or web address] and access using universal [place] password for that web site. If you do not have the password, please call the hospital operator.  09/08/2022, 12:10 PM

## 2022-09-08 NOTE — Progress Notes (Signed)
   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 describe the  improvement in quality of life since the last spiritual care visit. The Pt. attributes his ability to think about the future to the reduction of pain.)  Strengths The Pt. faith anchors the Pt. motivation to reduce rehab time.  Interventions  Spiritual Care Interventions Made Reflective listening;Encouragement  Intervention Outcomes  Outcomes Connection to values and goals of care;Awareness of health;Awareness of support  Spiritual Care Plan  Spiritual Care Issues Still Outstanding Chaplain will continue to follow     Chaplain Stephanie Acre [phone]

## 2022-09-08 NOTE — Progress Notes (Signed)
Physical Therapy Treatment Patient Details Name: David [name] MRN: [id number] DOB: 07/01/1960 Today's Date: 09/08/2022   History of Present Illness Pt is a 62 y.o. male who presented 08/20/22  with hypoglycemia. 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 5/28 surgical repair: T5-11 posterior/posterolateral arthrodesis  & Transpedicular decompression with partial corpectomy, left T8 & T8-9 laminectomy, bilateral facetectomy. 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    PT Comments    Pt continues to make progress and is tolerating longer session as well as tolerating pain better. Changed PT rec back to inpt PT >3 hrs/ day. Session today focused on sit>stand from lower surface. Pt performed LE strengthening exercises in SL, supine, sitting, and standing. Pregait activities performed in standing. PT will continue to follow.    Recommendations for follow up therapy are one component of a multi-disciplinary discharge planning process, led by the attending physician.  Recommendations may be updated based on patient status, additional functional criteria and insurance authorization.  Follow Up Recommendations  Can patient physically be transported by private vehicle: No    Assistance Recommended at Discharge Frequent or constant Supervision/Assistance  Patient can return home with the following Assist for transportation;Two people to help with walking and/or transfers;A lot of help with bathing/dressing/bathroom;Assistance with cooking/housework;Direct supervision/assist for financial management;Direct supervision/assist for medications management   Equipment Recommendations   Rolling walker (2 wheels);Wheelchair cushion (measurements PT);Wheelchair (measurements PT);BSC/3in1 (drop arm BSC)    Recommendations for Other Services Rehab consult     Precautions / Restrictions Precautions Precautions: Fall;Back Precaution Booklet Issued: No Precaution Comments: JP drain Required Braces or Orthoses: Spinal Brace Spinal Brace: Thoracolumbosacral orthotic;Applied in sitting position Other Brace: for use when ambulating Restrictions Weight Bearing Restrictions: Yes RLE Weight Bearing: Weight bearing as tolerated RLE Partial Weight Bearing Percentage or Pounds: heel weight bearing Other Position/Activity Restrictions: with Darco shoe     Mobility  Bed Mobility Overal bed mobility: Needs Assistance Bed Mobility: Rolling, Sidelying to Sit Rolling: Min assist Sidelying to sit: Min assist       General bed mobility comments: pt rolled R and L with assist to LLE and assist to get into full SL for ther ex    Transfers Overall transfer level: Needs assistance Equipment used: Ambulation equipment used Transfers: Bed to chair/wheelchair/BSC, Sit to/from Stand Sit to Stand: Min assist, From elevated surface Stand pivot transfers: Total assist         General transfer comment: worked on sit>stand from elevated bed (lower than flaps of stedy), 5x with min A for strengthening. Transfer via Lift Equipment: Stedy  Ambulation/Gait             Pre-gait activities: wt shifting R and L. Attempted reaching with LUE but pt so reliant on UE support he was unable to achieve this General Gait Details: unable due to knees buckling   Stairs             Wheelchair Mobility    Modified Rankin (Stroke Patients Only)       Balance Overall balance assessment: Needs assistance Sitting-balance support: Bilateral upper extremity supported, Feet supported Sitting balance-Leahy Scale: Good Sitting balance - Comments: pt able to maintain sitting with single  extremity support and remain balanced swith donning of brace   Standing balance support: Bilateral upper extremity supported, During functional activity Standing balance-Leahy Scale: Poor Standing balance comment: heavy reliance on UE support due to knees buckling. Worked on avoidance of leaning fwd over bar of stedy but keeping wt in neutral and trying to activate quads.                            Cognition Arousal/Alertness: Awake/alert Behavior During Therapy: WFL for tasks assessed/performed Overall Cognitive Status: Impaired/Different from baseline Area of Impairment: Memory, Problem solving                   Current Attention Level: Alternating Memory: Decreased short-term memory Following Commands: Follows multi-step commands with increased time   Awareness: Anticipatory Problem Solving: Slow processing, Requires verbal cues General Comments: pt able to converse while following commands today. Played pt's preferred music during session (pt is a musician himself) which was motivating for him        Exercises General Exercises - Lower Extremity Ankle Circles/Pumps: AROM, Both, Seated, 15 reps Long Arc Quad: AROM, Both, 10 reps, Seated (with light resistance on R) Hip ABduction/ADduction: Sidelying, AAROM, Right, Left, 10 reps Hip Flexion/Marching: AROM, Both, 10 reps, Seated Heel Raises: AROM, Both, 10 reps, Seated Other Exercises Other Exercises: bridges x10 with assist to keep feet from sliding    General Comments General comments (skin integrity, edema, etc.): VSS.      Pertinent Vitals/Pain Pain Assessment Pain Assessment: Faces Faces Pain Scale: Hurts little more Pain Location: back Pain Descriptors / Indicators: Discomfort, Grimacing, Guarding Pain Intervention(s): Monitored during session, Limited activity within patient's tolerance, Premedicated before session    Home Living                          Prior Function             PT Goals (current goals can now be found in the care plan section) Acute Rehab PT Goals Patient Stated Goal: to improve and reduce pain PT Goal Formulation: With patient Time For Goal Achievement: 09/14/22 Potential to Achieve Goals: Good Progress towards PT goals: Progressing toward goals    Frequency    Min 5X/week      PT Plan Discharge plan needs to be updated    Co-evaluation              AM-PAC PT "6 Clicks" Mobility   Outcome Measure  Help needed turning from your back to your side while in a flat bed without using bedrails?: A Little Help needed moving from lying on your back to sitting on the side of a flat bed without using bedrails?: A Lot Help needed moving to and from a bed to a chair (including a wheelchair)?: A Little Help needed standing up from a chair using your arms (e.g., wheelchair or bedside chair)?: A Lot Help needed to walk in hospital room?: Total Help needed climbing 3-5 steps with a railing? : Total 6 Click Score: 12    End of Session Equipment Utilized During Treatment: Back brace Activity Tolerance: Patient tolerated treatment well Patient left: with nursing/sitter in room;with call bell/phone within reach;in chair;with chair alarm set Nurse Communication: Mobility status PT Visit Diagnosis: Difficulty in walking, not elsewhere classified (R26.2);Other abnormalities of gait and mobility (R26.89);Muscle weakness (generalized) (M62.81);Pain;Unsteadiness on feet (R26.81) Pain - part of body:  (back)  Time: 1610-9604 PT Time Calculation (min) (ACUTE ONLY): 36 min  Charges:  $Therapeutic Exercise: 8-22 mins $Therapeutic Activity: 8-22 mins                     Lyanne Co, PT  Acute Rehab Services Secure chat preferred Office [phone]    Lawana Chambers Delma Drone 09/08/2022, 2:21 PM

## 2022-09-08 NOTE — Progress Notes (Signed)
Occupational Therapy Treatment Patient Details Name: David [name] MRN: [id number] DOB: [birth year] Today's Date: 09/08/2022   History of present illness Pt is a 62 y.o. male who presented 08/20/22  with hypoglycemia. 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 5/28 surgical repair: T5-11 posterior/posterolateral arthrodesis  & Transpedicular decompression with partial corpectomy, left T8 & T8-9 laminectomy, bilateral facetectomy. 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 Educated pt on AE for LB dressing tasks with good carryover, strategies for TLSO brace mgmt and scoot transfers with Min A. Discussed plan for standing tolerance in [place] at sink during ADLs and BSC transfers in next sessions. Pending continued improvements in pain and activity tolerance, recommend intensive therapy services at [place].   Recommendations for follow up therapy are one component of a multi-disciplinary discharge planning process, led by the attending physician.  Recommendations may be updated based on patient status, additional functional criteria and insurance authorization.    Assistance Recommended at Discharge Frequent or constant Supervision/Assistance  Patient can return home with the following  A lot of help with walking and/or transfers;A lot of help with bathing/dressing/bathroom   Equipment Recommendations  BSC/3in1    Recommendations for Other Services Rehab consult    Precautions / Restrictions Precautions Precautions: Fall;Back Precaution Booklet Issued:  No Precaution Comments: JP drain Required Braces or Orthoses: Spinal Brace Spinal Brace: Thoracolumbosacral orthotic;Applied in sitting position Other Brace: for use when ambulating Restrictions Weight Bearing Restrictions: Yes RLE Weight Bearing: Weight bearing as tolerated RLE Partial Weight Bearing Percentage or Pounds: heel weight bearing Other Position/Activity Restrictions: with Darco shoe       Mobility Bed Mobility Overal bed mobility: Needs Assistance Bed Mobility: Sidelying to Sit         Sit to sidelying: Mod assist General bed mobility comments: assist for BLE back to bed; able to scoot self up in bed using headboard    Transfers Overall transfer level: Needs assistance Equipment used: None Transfers: Bed to chair/wheelchair/BSC            Lateral/Scoot Transfers: Min assist General transfer comment: light MIn A to scoot to L from chair to bed with minor stability provided for trunk to avoid posterior LOB     Balance Overall balance assessment: Needs assistance Sitting-balance support: Bilateral upper extremity supported, Feet supported Sitting balance-Leahy Scale: Fair                                     ADL either performed or assessed with clinical judgement   ADL Overall ADL's : Needs assistance/impaired                     Lower Body Dressing: Minimal assistance;Sitting/lateral leans;With adaptive equipment Lower Body Dressing Details (indicate cue type and reason): educated on use of reacher and sock aid to doff/don socks with Min A for proper technique and fair carryover. Issued this AE for pt use to maximize independence and adherence to back precautions  General ADL Comments: Able to doff TLSO brace with Min A - cues for locating shoulder straps and doffing    Extremity/Trunk Assessment Upper Extremity Assessment Upper Extremity Assessment: Generalized weakness   Lower Extremity Assessment Lower  Extremity Assessment: Defer to PT evaluation        Vision   Vision Assessment?: No apparent visual deficits   Perception     Praxis      Cognition Arousal/Alertness: Awake/alert Behavior During Therapy: WFL for tasks assessed/performed Overall Cognitive Status: Impaired/Different from baseline Area of Impairment: Attention, Memory, Following commands, Safety/judgement, Awareness, Problem solving                   Current Attention Level: Selective Memory: Decreased short-term memory Following Commands: Follows one step commands consistently, Follows multi-step commands with increased time Safety/Judgement: Decreased awareness of deficits, Decreased awareness of safety Awareness: Intellectual, Emergent Problem Solving: Slow processing, Decreased initiation, Requires verbal cues General Comments: writing down pain med times to combat memory deficits, showing some insight into deficits and fair carryover of education. pt demonstrating problem solving during transfers/ADL compensatory strategies        Exercises      Shoulder Instructions       General Comments      Pertinent Vitals/ Pain       Pain Assessment Pain Assessment: Faces Faces Pain Scale: Hurts little more Pain Location: back Pain Descriptors / Indicators: Discomfort, Guarding, Grimacing Pain Intervention(s): Monitored during session, Premedicated before session  Home Living                                          Prior Functioning/Environment              Frequency  Min 2X/week        Progress Toward Goals  OT Goals(current goals can now be found in the care plan section)     Acute Rehab OT Goals Patient Stated Goal: pain control, get out of here OT Goal Formulation: With patient Time For Goal Achievement: 09/14/22 Potential to Achieve Goals: Fair  Plan      Co-evaluation                 AM-PAC OT "6 Clicks" Daily Activity     Outcome Measure    Help from another person eating meals?: None Help from another person taking care of personal grooming?: A Little Help from another person toileting, which includes using toliet, bedpan, or urinal?: Total Help from another person bathing (including washing, rinsing, drying)?: Total Help from another person to put on and taking off regular upper body clothing?: A Lot Help from another person to put on and taking off regular lower body clothing?: A Lot 6 Click Score: 13    End of Session Equipment Utilized During Treatment: Back brace  OT Visit Diagnosis: Unsteadiness on feet (R26.81);Other abnormalities of gait and mobility (R26.89);Muscle weakness (generalized) (M62.81);Pain   Activity Tolerance Patient tolerated treatment well   Patient Left in bed;with call bell/phone within reach;with bed alarm set   Nurse Communication Mobility status;Patient requests pain meds        Time: 1610-9604 OT Time Calculation (min): 36 min  Charges: OT General Charges $OT Visit: 1 Visit OT Treatments $Self Care/Home Management : 8-22 mins $Therapeutic Activity: 8-22 mins  Bradd Canary, OTR/L Acute Rehab Services Office: [phone]   Lorre Munroe 09/08/2022,  1:28 PM

## 2022-09-09 DIAGNOSIS — I4819 Other persistent atrial fibrillation: Secondary | ICD-10-CM | POA: Diagnosis not present

## 2022-09-09 DIAGNOSIS — E162 Hypoglycemia, unspecified: Secondary | ICD-10-CM | POA: Diagnosis not present

## 2022-09-09 DIAGNOSIS — I10 Essential (primary) hypertension: Secondary | ICD-10-CM | POA: Diagnosis not present

## 2022-09-09 DIAGNOSIS — E876 Hypokalemia: Secondary | ICD-10-CM | POA: Diagnosis not present

## 2022-09-09 LAB — CBC
HCT: 27.3 % — ABNORMAL LOW (ref 39.0–52.0)
Hemoglobin: 8.9 g/dL — ABNORMAL LOW (ref 13.0–17.0)
MCH: 29.4 pg (ref 26.0–34.0)
MCHC: 32.6 g/dL (ref 30.0–36.0)
MCV: 90.1 fL (ref 80.0–100.0)
Platelets: 304 10*3/uL (ref 150–400)
RBC: 3.03 MIL/uL — ABNORMAL LOW (ref 4.22–5.81)
RDW: 17.5 % — ABNORMAL HIGH (ref 11.5–15.5)
WBC: 8.6 10*3/uL (ref 4.0–10.5)
nRBC: 0 % (ref 0.0–0.2)

## 2022-09-09 LAB — BASIC METABOLIC PANEL
Anion gap: 8 (ref 5–15)
BUN: 16 mg/dL (ref 6–20)
CO2: 23 mmol/L (ref 22–32)
Calcium: 8.9 mg/dL (ref 8.9–10.3)
Chloride: 99 mmol/L (ref 98–111)
Creatinine, Ser: 1.23 mg/dL (ref 0.61–1.24)
GFR, Estimated: >60 mL/min (ref >60)
Glucose, Bld: 136 mg/dL — ABNORMAL HIGH (ref 70–99)
Potassium: 4.4 mmol/L (ref 3.5–5.1)
Sodium: 130 mmol/L — ABNORMAL LOW (ref 135–145)

## 2022-09-09 LAB — GLUCOSE, CAPILLARY
Glucose-Capillary: 142 mg/dL — ABNORMAL HIGH (ref 70–99)
Glucose-Capillary: 140 mg/dL — ABNORMAL HIGH (ref 70–99)
Glucose-Capillary: 227 mg/dL — ABNORMAL HIGH (ref 70–99)
Glucose-Capillary: 86 mg/dL (ref 70–99)
Glucose-Capillary: 76 mg/dL (ref 70–99)

## 2022-09-09 LAB — MAGNESIUM: Magnesium: 1.8 mg/dL (ref 1.7–2.4)

## 2022-09-09 MED ORDER — ALTEPLASE 2 MG IJ SOLR
2.0000 mg | Freq: Once | INTRAMUSCULAR | Status: DC
  Filled 2022-09-09 (×2): qty 2

## 2022-09-09 NOTE — Progress Notes (Signed)
Physical Therapy Treatment Patient Details Name: David [name] MRN: [id number] DOB: [birth year] Today's Date: 09/09/2022   History of Present Illness Pt is a 62 y.o. male who presented 08/20/22  with hypoglycemia. 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 5/28 surgical repair: T5-11 posterior/posterolateral arthrodesis  & Transpedicular decompression with partial corpectomy, left T8 & T8-9 laminectomy, bilateral facetectomy. 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 Pt was able to roll and transition sidelying to sit EOB with only min guard and return to sidelying with only minA today. He was able to progress to utilizing the RW for support with noted improved bil knee stability. His L knee did buckle a few times though, needing blocking and assistance to recover. When he did have bil knee stability in standing they appeared to be hyperextending instead though. Performed mini squats and LAQs to progress his quads strength bil. Educated pt on exercises to progress his bil LE and UE strength while in bed and provided therabands. Will continue to follow acutely.    Recommendations for follow up therapy are one component of a multi-disciplinary discharge planning process, led by the attending physician.  Recommendations may be updated based on patient status, additional functional criteria and insurance authorization.  Follow Up Recommendations  Can patient physically be transported by private vehicle: No    Assistance Recommended at Discharge Frequent or constant Supervision/Assistance   Patient can return home with the following Assist for transportation;Two people to help with walking and/or transfers;A lot of help with bathing/dressing/bathroom;Assistance with cooking/housework;Direct supervision/assist for financial management;Direct supervision/assist for medications management   Equipment Recommendations  Rolling walker (2 wheels);Wheelchair cushion (measurements PT);Wheelchair (measurements PT);BSC/3in1 (drop arm BSC)    Recommendations for Other Services       Precautions / Restrictions Precautions Precautions: Fall;Back Precaution Booklet Issued: No Required Braces or Orthoses: Spinal Brace Spinal Brace: Thoracolumbosacral orthotic;Applied in sitting position Other Brace: for use when ambulating Restrictions Weight Bearing Restrictions: Yes RLE Weight Bearing: Partial weight bearing RLE Partial Weight Bearing Percentage or Pounds: heel weight bearing Other Position/Activity Restrictions: with Darco shoe     Mobility  Bed Mobility Overal bed mobility: Needs Assistance Bed Mobility: Rolling, Sidelying to Sit, Sit to Sidelying Rolling: Min guard Sidelying to sit: Min guard, HOB elevated     Sit to sidelying: Min assist, HOB elevated General bed mobility comments: Pt cued to flex legs and log roll to L using bed rail, min guard. Extra time with cues to bring legs off bed and ascend trunk, min guard for safety, HOB elevated and use of bed rail. MinA to lift legs to return to sidelying from sit, cuing pt to lean to L elbow to descend trunk.    Transfers Overall transfer level: Needs assistance Equipment used: Rolling walker (2 wheels) Transfers: Sit to/from Stand Sit to Stand: From elevated surface, Mod assist           General transfer comment: L knee blocked/closely guarded with transfers. EOB elevated for first x2 reps but lowered (not to lowest height though) for x1 rep. Pt unable to stand when  pulling up on RW with bil hands, needing repeated  reminders to place 1 hand on the bed to push up to stand, modA each rep.    Ambulation/Gait             Pre-gait activities: Lateral weight shifting standing at EOB with UE support on RW and L knee blocked/closely guarded during stance phase due to noted buckling. No buckling on R noted. Cued pt to try to lift contralateral leg for x1-2 reps but pt placing each foot in odd places once foot touched back down and was unable to correct with max cues thus cued pt to sit. ModA for balance.Pt declined to wear R darco shoe due to imbalance     Stairs             Wheelchair Mobility    Modified Rankin (Stroke Patients Only)       Balance Overall balance assessment: Needs assistance Sitting-balance support: Bilateral upper extremity supported, Feet supported, Single extremity supported Sitting balance-Leahy Scale: Poor Sitting balance - Comments: Reliant on UE support for balance and pain management   Standing balance support: Bilateral upper extremity supported, During functional activity Standing balance-Leahy Scale: Poor Standing balance comment: heavy reliance on UE support due to L knee buckling. Needs cues to extend hips. Pt tends to hyperextend bil knees though when cued to stand upright with legs straight. ModA for standing balance                            Cognition Arousal/Alertness: Awake/alert Behavior During Therapy: WFL for tasks assessed/performed Overall Cognitive Status: Impaired/Different from baseline Area of Impairment: Memory, Problem solving, Attention, Following commands, Awareness                   Current Attention Level: Alternating Memory: Decreased short-term memory Following Commands: Follows multi-step commands with increased time   Awareness: Anticipatory Problem Solving: Slow processing, Requires verbal cues General Comments: Needs reminders for hand placement with transfers.        Exercises General Exercises - Lower  Extremity Long Arc Quad: AROM, Both, 10 reps, Seated (x5 sec isometric holds for x4 reps) Mini-Sqauts: AROM, Strengthening, Both, 5 reps, Standing (with RW support and mod-maxA for balance, L knee buckling an dneeding blocking)    General Comments General comments (skin integrity, edema, etc.): provided therabands and encouraged to perform UE and LE exercises and get OOB to chair with nursing staff over weekend      Pertinent Vitals/Pain Pain Assessment Pain Assessment: Faces Faces Pain Scale: Hurts little more Pain Location: back Pain Descriptors / Indicators: Discomfort, Grimacing, Guarding Pain Intervention(s): Monitored during session, Limited activity within patient's tolerance, Premedicated before session, Repositioned    Home Living                          Prior Function            PT Goals (current goals can now be found in the care plan section) Acute Rehab PT Goals Patient Stated Goal: to improve and reduce pain PT Goal Formulation: With patient Time For Goal Achievement: 09/14/22 Potential to Achieve Goals: Good Progress towards PT goals: Progressing toward goals    Frequency    Min 5X/week      PT Plan Current plan remains appropriate    Co-evaluation              AM-PAC PT "6  Clicks" Mobility   Outcome Measure  Help needed turning from your back to your side while in a flat bed without using bedrails?: A Little Help needed moving from lying on your back to sitting on the side of a flat bed without using bedrails?: A Little Help needed moving to and from a bed to a chair (including a wheelchair)?: A Lot Help needed standing up from a chair using your arms (e.g., wheelchair or bedside chair)?: A Lot Help needed to walk in hospital room?: Total Help needed climbing 3-5 steps with a railing? : Total 6 Click Score: 12    End of Session Equipment Utilized During Treatment: Back brace Activity Tolerance: Patient tolerated treatment  well Patient left: with nursing/sitter in room;with call bell/phone within reach;in bed;with bed alarm set Nurse Communication: Mobility status PT Visit Diagnosis: Difficulty in walking, not elsewhere classified (R26.2);Other abnormalities of gait and mobility (R26.89);Muscle weakness (generalized) (M62.81);Pain;Unsteadiness on feet (R26.81) Pain - part of body:  (back)     Time: 1610-9604 PT Time Calculation (min) (ACUTE ONLY): 27 min  Charges:  $Therapeutic Exercise: 8-22 mins $Therapeutic Activity: 8-22 mins                     Raymond Gurney, PT, DPT Acute Rehabilitation Services  Office: [phone]    Jewel Baize 09/09/2022, 5:37 PM

## 2022-09-09 NOTE — Progress Notes (Addendum)
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 Chestine Spore on 4/25 persistent A-fib on Eliquis, DM-2, seizure disorder, HTN, HLD and chronic pain return to ED with hypoglycemia to 38.  Patient was hospitalized for disseminated MRSA bacteremia from 4/9-5/17, and discharged on p.o. doxycycline.  Patient was hypoglycemic to 38 when EMS arrived.  His hypoglycemia persisted despite intervention in ED and leading to his rehospitalization.  He also have significant back pain.  MRI thoracic spine concerning for progressive discitis/osteomyelitis at T7-T9, extensive phlegmonous change in the paravertebral soft tissues,  partial collapse of T8 vertebral body with 40% vertebral body height loss and approximately 6 mm bony retropulsion, moderate canal stenosis and mild cord edema.  Neurosurgery and ID consulted.   Patient underwent surgical decompression by  neurosurgery. Palliative medicine helping with pain management.  Therapy recommended CIR, unfortunately, pt does not have caregiver support, hence SNF is now recommended. TOC  on board and assisting with disposition.    Patient remains confused despite adjusting pain meds. His sodium has improved. CT head and MRI brain did not reveal anything acute. Neuro exam is grossly normal except for his mental status. No source of infection.  Adjusting pain meds.    Assessment & Plan:   Principal Problem:   Hypoglycemia Active Problems:   Essential hypertension   Insulin dependent type 2 diabetes mellitus (HCC)   Persistent atrial fibrillation (HCC)   Pure hypercholesterolemia   Cellulitis in diabetic foot (HCC)   Diskitis   Hypokalemia   Hypotension    Constipation   Acute metabolic encephalopathy   Acute postoperative anemia due to expected blood loss  #1 disseminated MRSA bacteremia with T7-T9 discitis and osteomyelitis -MRI done of patient's back was concerning for progressive discitis/osteomyelitis. -Patient noted to have recent hospitalization from 4/9-5/17 and noted to have been discharged on doxycycline. -Patient noted to have previously been treated with daptomycin followed by vancomycin and oritavancin. -Patient seen by ID and neurosurgery during this hospitalization. -Patient underwent surgical intervention 5/29 per neurosurgery, JP drain subsequently removed. -ID following and recommended daptomycin to start 5/29 and oral doxycycline to start on 6/26 2024. -Palliative care following and helping manage patient's pain. -Continue Robaxin 750 mg every 6 hours as needed, Toradol 50 mg IV every 8 hours as needed, Lyrica 75 mg nightly, Lidoderm patch, celecoxib 100 mg twice daily, BuSpar 15 mg twice daily, fentanyl patch 50 mcg every 72 hours, oxycodone 10 mg every 4 hours as needed. -Continue bowel regimen.  2.  Right foot osteomyelitis status post right fifth ray amputation 4/30 -Continue current antibiotic regimen. -WBAT. -Will need outpatient follow-up with orthopedics.  3.  Chronic systolic CHF -Lasix on hold secondary to hyponatremia.   -Patient with urine output of 5.250 L over the past 24 hours.   -Monitor volume status with hydration.    4.  Hypomagnesemia/hypokalemia -Repleted.  5.  Hypertension -Continue Coreg.   -Continue to hold Lasix secondary to hyponatremia.  -Pain likely playing a role in patient's elevated blood pressure.  6.  Acute metabolic encephalopathy -Felt secondary to pain meds versus hyponatremia versus withdrawal. -It is noted that patient did have some intermittent hallucinations. -Ammonia level at 38. -CT head without any  acute abnormalities. -MRI brain negative for acute  abnormalities. -Clinical improvement.  -Likely at baseline.   7.  Persistent A-fib -Coreg for rate control.  -Per prior physician anticoagulation to be held for 1 week per neurosurgery. -Eliquis resumed 09/08/2022.  8.  Moderate pleural effusion -Stable. -Continue to hold Lasix.   9.  Constipation -Resolved. -Continue current bowel regiment.  10.  Hypotension -Resolved.  11.  Postop acute blood loss anemia -Likely loss postoperatively. -Started posttransfusion 1 unit PRBCs hemoglobin currently stable at 8.9. -Follow H&H. -Transfusion for showed hemoglobin < 7.  12.  AKI -Felt likely secondary to prerenal azotemia secondary to hypotension and dehydration. -Patient with urine output of 5.250 L over the past 24 hours. -Resolved.  13.  Hyponatremia -??  Etiology. -Different show includes secondary to volume overload versus poor oral intake. -SIADH ruled out. -Urine sodium less than 10, TSH within normal limits, serum osmolality 287, serum cortisol is 12. -Sodium levels improving with holding Lasix and placing on IV fluids. -Urine output of 5.250 L over the past 24 hours. -Continue to hold Lasix.   -Repeat labs in the AM.  14.  Diabetes mellitus type 2 -Hemoglobin A1c 9.1 (07/13/2022). -CBG 140 this morning. -CBG noted at 86 around 5:30 PM. -Continue Semglee 20 units daily. -Continue SSI. -Discontinue meal coverage NovoLog.   DVT prophylaxis: Eliquis Code Status: Full Family Communication: Updated patient.  No family at bedside. Disposition: SNF when bed available.  Status is: Inpatient Remains inpatient appropriate because: Severity of illness   Consultants:  ID: Dr. Thedore Mins 08/23/2022 Wound care RN, Ria Bush 08/24/2022 Palliative care: Richrd Humbles 08/26/2022  Procedures:  CT angiogram chest 08/22/2022 CT chest 09/05/2022 CT head 09/03/2022 Chest x-ray 08/20/2022, 09/01/2022, PICC line placement 09/01/2022 Plan films of the T-spine 08/30/2022 MRI of the  T-spine 08/22/2022 MRI of the brain 09/05/2022 T5-6, T6-7, T7-8, T8-9, T9-10, T10-11 posterior/posterior lateral arthrodesis/transpedicular decompression with partial corpectomy, left T8/T8-9 laminectomy, bilateral facetectomy/segmental instrumentation with percutaneously placed pedicle screw and rod construct at T5 to 6-7 to 8-9 to 10-11/Harvest of local autograft/use of morselized allograft/intraoperative neuronavigation with [facility]/intraoperative CT-Per neurosurgery: Dr. Maisie Fus 08/30/2022  Antimicrobials:  Anti-infectives (From admission, onward)    Start     Dose/Rate Route Frequency Ordered Stop   09/28/22 1000  doxycycline (VIBRA-TABS) tablet 100 mg        100 mg Oral Every 12 hours 09/01/22 1113     08/31/22 2000  DAPTOmycin (CUBICIN) 650 mg in sodium chloride 0.9 % IVPB        8 mg/kg  83.3 kg 126 mL/hr over 30 Minutes Intravenous Daily 08/31/22 1053 09/27/22 2359   08/30/22 2300  vancomycin (VANCOREADY) IVPB 750 mg/150 mL  Status:  Discontinued        750 mg 150 mL/hr over 60 Minutes Intravenous Every 12 hours 08/30/22 1150 08/31/22 1053   08/30/22 1712  vancomycin (VANCOCIN) powder  Status:  Discontinued          As needed 08/30/22 1714 08/30/22 1756   08/30/22 1142  ceFAZolin (ANCEF) 2-4 GM/100ML-% IVPB  Status:  Discontinued       Note to Pharmacy: [facility], [place]: cabinet override      08/30/22 1142 08/30/22 1300   08/30/22 0600  ceFAZolin (ANCEF) IVPB 2g/100 mL premix        2 g 200 mL/hr over 30 Minutes Intravenous On call to O.R. 08/29/22 1848 08/30/22 1401   08/27/22 1041  vancomycin (VANCOCIN) IVPB 1000 mg/200 mL premix  Status:  Discontinued  1,000 mg 200 mL/hr over 60 Minutes Intravenous Every 12 hours 08/27/22 0802 08/30/22 1150   08/26/22 2300  vancomycin (VANCOREADY) IVPB 1250 mg/250 mL  Status:  Discontinued        1,250 mg 166.7 mL/hr over 90 Minutes Intravenous Every 24 hours 08/26/22 1014 08/27/22 0802   08/23/22 2200  vancomycin (VANCOREADY) IVPB 750 mg/150  mL  Status:  Discontinued        750 mg 150 mL/hr over 60 Minutes Intravenous Every 12 hours 08/23/22 0938 08/26/22 1014   08/23/22 1030  vancomycin (VANCOREADY) IVPB 1500 mg/300 mL        1,500 mg 150 mL/hr over 120 Minutes Intravenous  Once 08/23/22 0938 08/23/22 1312   08/20/22 2200  doxycycline (VIBRA-TABS) tablet 100 mg  Status:  Discontinued        100 mg Oral 2 times daily 08/20/22 1737 08/23/22 0927         Subjective: Laying in bed on bedpan.  Denies any chest pain or shortness of breath.  No abdominal pain.  Feels pain currently controlled on current regimen.    Objective: Vitals:   09/08/22 2354 09/09/22 0433 09/09/22 0803 09/09/22 1159  BP: (!) 165/81 (!) 170/94 (!) 148/99 (!) 142/96  Pulse: 67 63 60 (!) 54  Resp: 18 16  18   Temp: 97.8 F (36.6 C) 97.8 F (36.6 C) 98.2 F (36.8 C) 97.8 F (36.6 C)  TempSrc: Oral Oral Oral Oral  SpO2: 95% 95% 95% 96%  Weight:      Height:        Intake/Output Summary (Last 24 hours) at 09/09/2022 1819 Last data filed at 09/09/2022 1528 Gross per 24 hour  Intake 1908.24 ml  Output 4150 ml  Net -2241.76 ml    Filed Weights   08/20/22 1821 08/23/22 2307  Weight: 77.8 kg 83.3 kg    Examination:  General exam: NAD. Honeycomb dressing on back c/d/i Respiratory system: CTAB.  No wheezes, no crackles, no rhonchi.  Fair air movement. Speaking in full sentences.   Cardiovascular system: RRR no murmurs rubs or gallops.  No JVD.  No lower extremity edema. Gastrointestinal system: Abdomen is soft, nontender, nondistended, positive bowel sounds.  No rebound.  No guarding.  Central nervous system: Alert and oriented. No focal neurological deficits. Extremities: Symmetric 5 x 5 power. Skin: No rashes, lesions or ulcers Psychiatry: Judgement and insight appear normal. Mood & affect appropriate.     Data Reviewed: I have personally reviewed following labs and imaging studies  CBC: Recent Labs  Lab 09/03/22 0421 09/04/22 0505  09/07/22 0530 09/08/22 0410 09/09/22 0510  WBC 9.1 9.3 8.2 9.5 8.6  NEUTROABS 6.7 6.8 5.5  --   --   HGB 8.0* 8.2* 8.1* 8.8* 8.9*  HCT 24.5* 24.9* 25.8* 27.3* 27.3*  MCV 87.2 88.0 89.3 88.6 90.1  PLT 231 275 304 316 304     Basic Metabolic Panel: Recent Labs  Lab 09/04/22 0505 09/05/22 0822 09/07/22 0530 09/08/22 0410 09/09/22 0510  NA 128* 132* 129* 126* 130*  K 4.3 4.0 4.3 4.6 4.4  CL 99 101 98 94* 99  CO2 21* 23 24 23 23   GLUCOSE 263* 218* 190* 165* 136*  BUN 17 13 13 17 16   CREATININE 1.10 0.94 1.08 1.13 1.23  CALCIUM 9.1 9.2 8.9 8.9 8.9  MG  --   --   --  1.8 1.8     GFR: Estimated Creatinine Clearance: 70.1 mL/min (by C-G formula based on SCr  of 1.23 mg/dL).  Liver Function Tests: No results for input(s): "AST", "ALT", "ALKPHOS", "BILITOT", "PROT", "ALBUMIN" in the last 168 hours.  CBG: Recent Labs  Lab 09/08/22 2116 09/09/22 0625 09/09/22 0823 09/09/22 1157 09/09/22 1735  GLUCAP 96 142* 140* 227* 86      No results found for this or any previous visit (from the past 240 hour(s)).        Radiology Studies: No results found.      Scheduled Meds:  sodium chloride   Intravenous Once   acetaminophen  1,000 mg Oral TID   alteplase  2 mg Intracatheter Once   apixaban  5 mg Oral BID   atorvastatin  80 mg Oral Daily   busPIRone  15 mg Oral BID   carvedilol  3.125 mg Oral BID   celecoxib  100 mg Oral BID   Chlorhexidine Gluconate Cloth  6 each Topical Daily   digoxin  125 mcg Oral Daily   [START ON 09/28/2022] doxycycline  100 mg Oral Q12H   DULoxetine  20 mg Oral Daily   famotidine  20 mg Oral BID   feeding supplement  237 mL Oral BID BM   fentaNYL  1 patch Transdermal Q48H   fluticasone  1 spray Each Nare Daily   insulin aspart  0-5 Units Subcutaneous QHS   insulin aspart  0-9 Units Subcutaneous TID WC   insulin aspart  2 Units Subcutaneous TID WC   insulin glargine-yfgn  20 Units Subcutaneous Daily   lidocaine  1 patch Transdermal  Q24H   magnesium oxide  400 mg Oral BID   pantoprazole  40 mg Oral Daily   polyethylene glycol  17 g Oral BID   pregabalin  75 mg Oral QHS   senna-docusate  1 tablet Oral BID   sodium chloride flush  10-40 mL Intracatheter Q12H   sodium chloride flush  3 mL Intravenous Q12H   Continuous Infusions:  sodium chloride Stopped (09/01/22 1621)   sodium chloride 75 mL/hr at 09/09/22 1631   DAPTOmycin (CUBICIN) 650 mg in sodium chloride 0.9 % IVPB 650 mg (09/09/22 1518)     LOS: 17 days    Time spent: 35 minutes    Ramiro Harvest, MD [facility]   To contact the attending provider between 7A-7P or the covering provider during after hours 7P-7A, please log into the web site [email or web address] and access using universal [facility] password for that web site. If you do not have the password, please call the hospital operator.  09/09/2022, 6:19 PM

## 2022-09-10 DIAGNOSIS — I4819 Other persistent atrial fibrillation: Secondary | ICD-10-CM | POA: Diagnosis not present

## 2022-09-10 DIAGNOSIS — M4644 Discitis, unspecified, thoracic region: Secondary | ICD-10-CM | POA: Diagnosis not present

## 2022-09-10 DIAGNOSIS — E119 Type 2 diabetes mellitus without complications: Secondary | ICD-10-CM | POA: Diagnosis not present

## 2022-09-10 DIAGNOSIS — Z515 Encounter for palliative care: Secondary | ICD-10-CM

## 2022-09-10 DIAGNOSIS — M546 Pain in thoracic spine: Secondary | ICD-10-CM | POA: Diagnosis not present

## 2022-09-10 DIAGNOSIS — E876 Hypokalemia: Secondary | ICD-10-CM | POA: Diagnosis not present

## 2022-09-10 DIAGNOSIS — I10 Essential (primary) hypertension: Secondary | ICD-10-CM | POA: Diagnosis not present

## 2022-09-10 DIAGNOSIS — E162 Hypoglycemia, unspecified: Secondary | ICD-10-CM | POA: Diagnosis not present

## 2022-09-10 LAB — GLUCOSE, CAPILLARY
Glucose-Capillary: 82 mg/dL (ref 70–99)
Glucose-Capillary: 149 mg/dL — ABNORMAL HIGH (ref 70–99)
Glucose-Capillary: 178 mg/dL — ABNORMAL HIGH (ref 70–99)
Glucose-Capillary: 121 mg/dL — ABNORMAL HIGH (ref 70–99)

## 2022-09-10 LAB — CBC
HCT: 27.3 % — ABNORMAL LOW (ref 39.0–52.0)
Hemoglobin: 8.7 g/dL — ABNORMAL LOW (ref 13.0–17.0)
MCH: 28.6 pg (ref 26.0–34.0)
MCHC: 31.9 g/dL (ref 30.0–36.0)
MCV: 89.8 fL (ref 80.0–100.0)
Platelets: 273 10*3/uL (ref 150–400)
RBC: 3.04 MIL/uL — ABNORMAL LOW (ref 4.22–5.81)
RDW: 17.7 % — ABNORMAL HIGH (ref 11.5–15.5)
WBC: 9.1 10*3/uL (ref 4.0–10.5)
nRBC: 0 % (ref 0.0–0.2)

## 2022-09-10 LAB — BASIC METABOLIC PANEL
Anion gap: 10 (ref 5–15)
BUN: 16 mg/dL (ref 6–20)
CO2: 23 mmol/L (ref 22–32)
Calcium: 8.8 mg/dL — ABNORMAL LOW (ref 8.9–10.3)
Chloride: 97 mmol/L — ABNORMAL LOW (ref 98–111)
Creatinine, Ser: 1.2 mg/dL (ref 0.61–1.24)
GFR, Estimated: >60 mL/min (ref >60)
Glucose, Bld: 88 mg/dL (ref 70–99)
Potassium: 4.5 mmol/L (ref 3.5–5.1)
Sodium: 130 mmol/L — ABNORMAL LOW (ref 135–145)

## 2022-09-10 MED ORDER — SORBITOL 70 % SOLN
30.0000 mL | Status: AC
  Administered 2022-09-10: 30 mL via ORAL
  Filled 2022-09-10 (×2): qty 30

## 2022-09-10 NOTE — Plan of Care (Signed)
  Problem: Education: Goal: Ability to verbalize activity precautions or restrictions will improve Outcome: Progressing Goal: Knowledge of the prescribed therapeutic regimen will improve Outcome: Progressing Goal: Understanding of discharge needs will improve Outcome: Progressing   Problem: Activity: Goal: Ability to avoid complications of mobility impairment will improve Outcome: Progressing Goal: Ability to tolerate increased activity will improve Outcome: Progressing   Problem: Bowel/Gastric: Goal: Gastrointestinal status for postoperative course will improve Outcome: Progressing   Problem: Pain Management: Goal: Pain level will decrease Outcome: Progressing   Problem: Skin Integrity: Goal: Will show signs of wound healing Outcome: Progressing

## 2022-09-10 NOTE — Plan of Care (Signed)
Problem: Education: Goal: Understanding of CV disease, CV risk reduction, and recovery process will improve Outcome: Progressing Goal: Individualized Educational Video(s) Outcome: Progressing   Problem: Activity: Goal: Ability to return to baseline activity level will improve Outcome: Progressing   Problem: Cardiovascular: Goal: Ability to achieve and maintain adequate cardiovascular perfusion will improve Outcome: Progressing Goal: Vascular access site(s) Level 0-1 will be maintained Outcome: Progressing   Problem: Health Behavior/Discharge Planning: Goal: Ability to safely manage health-related needs after discharge will improve Outcome: Progressing   Problem: Education: Goal: Knowledge of General Education information will improve Description: Including pain rating scale, medication(s)/side effects and non-pharmacologic comfort measures Outcome: Progressing   Problem: Health Behavior/Discharge Planning: Goal: Ability to manage health-related needs will improve Outcome: Progressing   Problem: Clinical Measurements: Goal: Ability to maintain clinical measurements within normal limits will improve Outcome: Progressing Goal: Will remain free from infection Outcome: Progressing Goal: Diagnostic test results will improve Outcome: Progressing Goal: Respiratory complications will improve Outcome: Progressing Goal: Cardiovascular complication will be avoided Outcome: Progressing   Problem: Activity: Goal: Risk for activity intolerance will decrease Outcome: Progressing   Problem: Nutrition: Goal: Adequate nutrition will be maintained Outcome: Progressing   Problem: Coping: Goal: Level of anxiety will decrease Outcome: Progressing   Problem: Elimination: Goal: Will not experience complications related to bowel motility Outcome: Progressing Goal: Will not experience complications related to urinary retention Outcome: Progressing   Problem: Pain Managment: Goal:  General experience of comfort will improve Outcome: Progressing   Problem: Safety: Goal: Ability to remain free from injury will improve Outcome: Progressing   Problem: Skin Integrity: Goal: Risk for impaired skin integrity will decrease Outcome: Progressing   Problem: Education: Goal: Ability to describe self-care measures that may prevent or decrease complications (Diabetes Survival Skills Education) will improve Outcome: Progressing Goal: Individualized Educational Video(s) Outcome: Progressing   Problem: Coping: Goal: Ability to adjust to condition or change in health will improve Outcome: Progressing   Problem: Fluid Volume: Goal: Ability to maintain a balanced intake and output will improve Outcome: Progressing   Problem: Health Behavior/Discharge Planning: Goal: Ability to identify and utilize available resources and services will improve Outcome: Progressing Goal: Ability to manage health-related needs will improve Outcome: Progressing   Problem: Metabolic: Goal: Ability to maintain appropriate glucose levels will improve Outcome: Progressing   Problem: Nutritional: Goal: Maintenance of adequate nutrition will improve Outcome: Progressing Goal: Progress toward achieving an optimal weight will improve Outcome: Progressing   Problem: Skin Integrity: Goal: Risk for impaired skin integrity will decrease Outcome: Progressing   Problem: Tissue Perfusion: Goal: Adequacy of tissue perfusion will improve Outcome: Progressing   Problem: Education: Goal: Ability to verbalize activity precautions or restrictions will improve Outcome: Progressing Goal: Knowledge of the prescribed therapeutic regimen will improve Outcome: Progressing Goal: Understanding of discharge needs will improve Outcome: Progressing   Problem: Activity: Goal: Ability to avoid complications of mobility impairment will improve Outcome: Progressing Goal: Ability to tolerate increased activity  will improve Outcome: Progressing Goal: Will remain free from falls Outcome: Progressing   Problem: Bowel/Gastric: Goal: Gastrointestinal status for postoperative course will improve Outcome: Progressing   Problem: Clinical Measurements: Goal: Ability to maintain clinical measurements within normal limits will improve Outcome: Progressing Goal: Postoperative complications will be avoided or minimized Outcome: Progressing Goal: Diagnostic test results will improve Outcome: Progressing   Problem: Pain Management: Goal: Pain level will decrease Outcome: Progressing   Problem: Skin Integrity: Goal: Will show signs of wound healing Outcome: Progressing   Problem: Health Behavior/Discharge   Planning: Goal: Identification of resources available to assist in meeting health care needs will improve Outcome: Progressing   Problem: Bladder/Genitourinary: Goal: Urinary functional status for postoperative course will improve Outcome: Progressing   

## 2022-09-10 NOTE — Progress Notes (Signed)
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 Chestine Spore on 4/25 persistent A-fib on Eliquis, DM-2, seizure disorder, HTN, HLD and chronic pain return to ED with hypoglycemia to 38.  Patient was hospitalized for disseminated MRSA bacteremia from 4/9-5/17, and discharged on p.o. doxycycline.  Patient was hypoglycemic to 38 when EMS arrived.  His hypoglycemia persisted despite intervention in ED and leading to his rehospitalization.  He also have significant back pain.  MRI thoracic spine concerning for progressive discitis/osteomyelitis at T7-T9, extensive phlegmonous change in the paravertebral soft tissues,  partial collapse of T8 vertebral body with 40% vertebral body height loss and approximately 6 mm bony retropulsion, moderate canal stenosis and mild cord edema.  Neurosurgery and ID consulted.   Patient underwent surgical decompression by  neurosurgery. Palliative medicine helping with pain management.  Therapy recommended CIR, unfortunately, pt does not have caregiver support, hence SNF is now recommended. TOC  on board and assisting with disposition.    Patient remains confused despite adjusting pain meds. His sodium has improved. CT head and MRI brain did not reveal anything acute. Neuro exam is grossly normal except for his mental status. No source of infection.  Adjusting pain meds.    Assessment & Plan:   Principal Problem:   Hypoglycemia Active Problems:   Essential hypertension   Insulin dependent type 2 diabetes mellitus (HCC)   Persistent atrial fibrillation (HCC)   Pure hypercholesterolemia   Cellulitis in diabetic foot (HCC)   Diskitis   Hypokalemia   Hypotension    Constipation   Acute metabolic encephalopathy   Acute postoperative anemia due to expected blood loss  #1 disseminated MRSA bacteremia with T7-T9 discitis and osteomyelitis -MRI done of patient's back was concerning for progressive discitis/osteomyelitis. -Patient noted to have recent hospitalization from 4/9-5/17 and noted to have been discharged on doxycycline. -Patient noted to have previously been treated with daptomycin followed by vancomycin and oritavancin. -Patient seen by ID and neurosurgery during this hospitalization. -Patient underwent surgical intervention 5/29 per neurosurgery, JP drain subsequently removed. -ID following and recommended daptomycin to start 5/29 and oral doxycycline to start on 6/26/ 2024. -Palliative care following and helping manage patient's pain. -Continue Robaxin 750 mg every 6 hours as needed, Toradol 50 mg IV every 8 hours as needed, Lyrica 75 mg nightly, Lidoderm patch, celecoxib 100 mg twice daily, BuSpar 15 mg twice daily, fentanyl patch 50 mcg every 72 hours, oxycodone 10 mg every 4 hours as needed. -Continue bowel regimen.  2.  Right foot osteomyelitis status post right fifth ray amputation 4/30 -Continue current antibiotic regimen. -WBAT. -Will need outpatient follow-up with orthopedics.  3.  Chronic systolic CHF -Lasix on hold secondary to hyponatremia.   -Patient seems to be auto diuresing. -Patient with urine output of 3.275 L over the past 24 hours.   -Monitor volume status with hydration.    4.  Hypomagnesemia/hypokalemia -Repleted.   5.  Hypertension -Continue Coreg.   -Continue to hold Lasix secondary to hyponatremia.  -Pain likely playing a role in patient's elevated blood pressure.  6.  Acute metabolic encephalopathy -Felt secondary to pain meds versus hyponatremia versus withdrawal. -It is noted that patient did have some intermittent hallucinations. -Ammonia  level at 38. -CT head without any acute abnormalities. -MRI brain  negative for acute abnormalities. -Clinical improvement.  -Likely at baseline.   7.  Persistent A-fib -Coreg for rate control.  -Per prior physician anticoagulation to be held for 1 week per neurosurgery. -Eliquis resumed 09/08/2022.  8.  Moderate pleural effusion -Stable. -Patient seems to be auto diuresing. -Continue to hold Lasix.   9.  Constipation -Patient states has been having some difficulty with bowel movements.   -Continue current bowel regimen.   -Sorbitol p.o. every 3 hours x 2 doses.   10.  Hypotension -Resolved.  11.  Postop acute blood loss anemia -Likely loss postoperatively. -Started posttransfusion 1 unit PRBCs hemoglobin currently stable at 8.1. -Follow H&H. -Transfusion for showed hemoglobin < 7.  12.  AKI -Felt likely secondary to prerenal azotemia secondary to hypotension and dehydration. -Patient with urine output of 3.275 L over the past 24 hours. -Resolved.  13.  Hyponatremia -??  Etiology. -Different show includes secondary to volume overload versus poor oral intake. -SIADH ruled out. -Urine sodium less than 10, TSH within normal limits, serum osmolality 287, serum cortisol is 12. -Sodium levels improving with holding Lasix and placing on IV fluids. -Urine output of 3.275 L over the past 24 hours. -Continue to hold Lasix.   -Repeat labs in the AM.  14.  Diabetes mellitus type 2 -Hemoglobin A1c 9.1 (07/13/2022). -CBG 82 this morning. -Continue Semglee 20 units daily. -Continue SSI. -Meal coverage NovoLog discontinued.   DVT prophylaxis: Eliquis Code Status: Full Family Communication: Updated patient.  No family at bedside. Disposition: SNF when bed available.  Status is: Inpatient Remains inpatient appropriate because: Severity of illness   Consultants:  ID: Dr. Thedore Mins 08/23/2022 Wound care RN, Ria Bush 08/24/2022 Palliative care: Richrd Humbles 08/26/2022  Procedures:  CT angiogram chest 08/22/2022 CT chest 09/05/2022 CT head  09/03/2022 Chest x-ray 08/20/2022, 09/01/2022, PICC line placement 09/01/2022 Plan films of the T-spine 08/30/2022 MRI of the T-spine 08/22/2022 MRI of the brain 09/05/2022 T5-6, T6-7, T7-8, T8-9, T9-10, T10-11 posterior/posterior lateral arthrodesis/transpedicular decompression with partial corpectomy, left T8/T8-9 laminectomy, bilateral facetectomy/segmental instrumentation with percutaneously placed pedicle screw and rod construct at T5 to 6-7 to 8-9 to 10-11/Harvest of local autograft/use of morselized allograft/intraoperative neuronavigation with [facility]/intraoperative CT-Per neurosurgery: Dr. Maisie Fus 08/30/2022  Antimicrobials:  Anti-infectives (From admission, onward)    Start     Dose/Rate Route Frequency Ordered Stop   09/28/22 1000  doxycycline (VIBRA-TABS) tablet 100 mg        100 mg Oral Every 12 hours 09/01/22 1113     08/31/22 2000  DAPTOmycin (CUBICIN) 650 mg in sodium chloride 0.9 % IVPB        8 mg/kg  83.3 kg 126 mL/hr over 30 Minutes Intravenous Daily 08/31/22 1053 09/27/22 2359   08/30/22 2300  vancomycin (VANCOREADY) IVPB 750 mg/150 mL  Status:  Discontinued        750 mg 150 mL/hr over 60 Minutes Intravenous Every 12 hours 08/30/22 1150 08/31/22 1053   08/30/22 1712  vancomycin (VANCOCIN) powder  Status:  Discontinued          As needed 08/30/22 1714 08/30/22 1756   08/30/22 1142  ceFAZolin (ANCEF) 2-4 GM/100ML-% IVPB  Status:  Discontinued       Note to Pharmacy: [facility], [place]: cabinet override      08/30/22 1142 08/30/22 1300   08/30/22 0600  ceFAZolin (ANCEF) IVPB 2g/100 mL premix        2 g 200 mL/hr  over 30 Minutes Intravenous On call to O.R. 08/29/22 1848 08/30/22 1401   08/27/22 1041  vancomycin (VANCOCIN) IVPB 1000 mg/200 mL premix  Status:  Discontinued        1,000 mg 200 mL/hr over 60 Minutes Intravenous Every 12 hours 08/27/22 0802 08/30/22 1150   08/26/22 2300  vancomycin (VANCOREADY) IVPB 1250 mg/250 mL  Status:  Discontinued        1,250 mg 166.7 mL/hr over  90 Minutes Intravenous Every 24 hours 08/26/22 1014 08/27/22 0802   08/23/22 2200  vancomycin (VANCOREADY) IVPB 750 mg/150 mL  Status:  Discontinued        750 mg 150 mL/hr over 60 Minutes Intravenous Every 12 hours 08/23/22 0938 08/26/22 1014   08/23/22 1030  vancomycin (VANCOREADY) IVPB 1500 mg/300 mL        1,500 mg 150 mL/hr over 120 Minutes Intravenous  Once 08/23/22 0938 08/23/22 1312   08/20/22 2200  doxycycline (VIBRA-TABS) tablet 100 mg  Status:  Discontinued        100 mg Oral 2 times daily 08/20/22 1737 08/23/22 0927         Subjective: Laying in bed on bedpan.  [place] to have a bowel movement, no bowel movement yesterday.  No chest pain.  No shortness of breath.  States trying to cut back on pain medication as he does not want to be dependent on it.    Objective: Vitals:   09/10/22 0032 09/10/22 0352 09/10/22 0828 09/10/22 1200  BP: 133/84 (!) 146/98 (!) 143/86 (!) 137/92  Pulse: 86 60 61 (!) 49  Resp: 18 16  18   Temp: 97.8 F (36.6 C) 97.7 F (36.5 C) 97.9 F (36.6 C) 97.7 F (36.5 C)  TempSrc: Oral Axillary Oral Oral  SpO2: 95% 97% 99% 90%  Weight:      Height:        Intake/Output Summary (Last 24 hours) at 09/10/2022 1219 Last data filed at 09/10/2022 0700 Gross per 24 hour  Intake 3671.27 ml  Output 3275 ml  Net 396.27 ml    Filed Weights   08/20/22 1821 08/23/22 2307  Weight: 77.8 kg 83.3 kg    Examination:  General exam: NAD. Honeycomb dressing on back c/d/i Respiratory system: Lungs clear to auscultation bilaterally.  No wheezes, no crackles, no rhonchi.  Fair air movement.  Speaking in full sentences.   Cardiovascular system: Regular rate rhythm no murmurs rubs or gallops.  No JVD.  No lower extremity edema.  Gastrointestinal system: Abdomen soft, nondistended, nontender, positive bowel sounds.  No rebound.  No guarding.  Central nervous system: Alert and oriented.  No focal neurological deficits.   Extremities: Symmetric 5 x 5 power. Skin:  No rashes, lesions or ulcers Psychiatry: Judgement and insight appear normal. Mood & affect appropriate.     Data Reviewed: I have personally reviewed following labs and imaging studies  CBC: Recent Labs  Lab 09/04/22 0505 09/07/22 0530 09/08/22 0410 09/09/22 0510 09/10/22 0430  WBC 9.3 8.2 9.5 8.6 9.1  NEUTROABS 6.8 5.5  --   --   --   HGB 8.2* 8.1* 8.8* 8.9* 8.7*  HCT 24.9* 25.8* 27.3* 27.3* 27.3*  MCV 88.0 89.3 88.6 90.1 89.8  PLT 275 304 316 304 273     Basic Metabolic Panel: Recent Labs  Lab 09/05/22 0822 09/07/22 0530 09/08/22 0410 09/09/22 0510 09/10/22 0430  NA 132* 129* 126* 130* 130*  K 4.0 4.3 4.6 4.4 4.5  CL 101 98 94* 99 97*  CO2 23 24 23 23 23   GLUCOSE 218* 190* 165* 136* 88  BUN 13 13 17 16 16   CREATININE 0.94 1.08 1.13 1.23 1.20  CALCIUM 9.2 8.9 8.9 8.9 8.8*  MG  --   --  1.8 1.8  --      GFR: Estimated Creatinine Clearance: 71.9 mL/min (by C-G formula based on SCr of 1.2 mg/dL).  Liver Function Tests: No results for input(s): "AST", "ALT", "ALKPHOS", "BILITOT", "PROT", "ALBUMIN" in the last 168 hours.  CBG: Recent Labs  Lab 09/09/22 1157 09/09/22 1735 09/09/22 2126 09/10/22 0633 09/10/22 1159  GLUCAP 227* 86 76 82 149*      No results found for this or any previous visit (from the past 240 hour(s)).        Radiology Studies: No results found.      Scheduled Meds:  sodium chloride   Intravenous Once   acetaminophen  1,000 mg Oral TID   alteplase  2 mg Intracatheter Once   apixaban  5 mg Oral BID   atorvastatin  80 mg Oral Daily   busPIRone  15 mg Oral BID   carvedilol  3.125 mg Oral BID   celecoxib  100 mg Oral BID   Chlorhexidine Gluconate Cloth  6 each Topical Daily   digoxin  125 mcg Oral Daily   [START ON 09/28/2022] doxycycline  100 mg Oral Q12H   DULoxetine  20 mg Oral Daily   famotidine  20 mg Oral BID   feeding supplement  237 mL Oral BID BM   fentaNYL  1 patch Transdermal Q48H   fluticasone  1 spray  Each Nare Daily   insulin aspart  0-5 Units Subcutaneous QHS   insulin aspart  0-9 Units Subcutaneous TID WC   insulin glargine-yfgn  20 Units Subcutaneous Daily   lidocaine  1 patch Transdermal Q24H   magnesium oxide  400 mg Oral BID   pantoprazole  40 mg Oral Daily   polyethylene glycol  17 g Oral BID   pregabalin  75 mg Oral QHS   senna-docusate  1 tablet Oral BID   sodium chloride flush  10-40 mL Intracatheter Q12H   sodium chloride flush  3 mL Intravenous Q12H   Continuous Infusions:  sodium chloride Stopped (09/01/22 1621)   DAPTOmycin (CUBICIN) 650 mg in sodium chloride 0.9 % IVPB 650 mg (09/09/22 1518)     LOS: 18 days    Time spent: 35 minutes    Ramiro Harvest, MD [facility]   To contact the attending provider between 7A-7P or the covering provider during after hours 7P-7A, please log into the web site [email or web address] and access using universal [facility] password for that web site. If you do not have the password, please call the hospital operator.  09/10/2022, 12:19 PM

## 2022-09-11 DIAGNOSIS — E876 Hypokalemia: Secondary | ICD-10-CM | POA: Diagnosis not present

## 2022-09-11 DIAGNOSIS — E162 Hypoglycemia, unspecified: Secondary | ICD-10-CM | POA: Diagnosis not present

## 2022-09-11 DIAGNOSIS — I10 Essential (primary) hypertension: Secondary | ICD-10-CM | POA: Diagnosis not present

## 2022-09-11 DIAGNOSIS — I4819 Other persistent atrial fibrillation: Secondary | ICD-10-CM | POA: Diagnosis not present

## 2022-09-11 LAB — GLUCOSE, CAPILLARY
Glucose-Capillary: 90 mg/dL (ref 70–99)
Glucose-Capillary: 92 mg/dL (ref 70–99)
Glucose-Capillary: 119 mg/dL — ABNORMAL HIGH (ref 70–99)
Glucose-Capillary: 178 mg/dL — ABNORMAL HIGH (ref 70–99)

## 2022-09-11 LAB — BASIC METABOLIC PANEL
Anion gap: 7 (ref 5–15)
BUN: 12 mg/dL (ref 6–20)
CO2: 23 mmol/L (ref 22–32)
Calcium: 8.8 mg/dL — ABNORMAL LOW (ref 8.9–10.3)
Chloride: 98 mmol/L (ref 98–111)
Creatinine, Ser: 1.15 mg/dL (ref 0.61–1.24)
GFR, Estimated: >60 mL/min (ref >60)
Glucose, Bld: 97 mg/dL (ref 70–99)
Potassium: 4.3 mmol/L (ref 3.5–5.1)
Sodium: 128 mmol/L — ABNORMAL LOW (ref 135–145)

## 2022-09-11 LAB — MAGNESIUM: Magnesium: 1.9 mg/dL (ref 1.7–2.4)

## 2022-09-11 MED ORDER — SODIUM CHLORIDE 0.9 % IV SOLN: INTRAVENOUS | Status: DC

## 2022-09-11 NOTE — Plan of Care (Signed)
Problem: Education: Goal: Understanding of CV disease, CV risk reduction, and recovery process will improve Outcome: Progressing Goal: Individualized Educational Video(s) Outcome: Progressing   Problem: Activity: Goal: Ability to return to baseline activity level will improve Outcome: Progressing   Problem: Cardiovascular: Goal: Ability to achieve and maintain adequate cardiovascular perfusion will improve Outcome: Progressing Goal: Vascular access site(s) Level 0-1 will be maintained Outcome: Progressing   Problem: Health Behavior/Discharge Planning: Goal: Ability to safely manage health-related needs after discharge will improve Outcome: Progressing   Problem: Education: Goal: Knowledge of General Education information will improve Description: Including pain rating scale, medication(s)/side effects and non-pharmacologic comfort measures Outcome: Progressing   Problem: Health Behavior/Discharge Planning: Goal: Ability to manage health-related needs will improve Outcome: Progressing   Problem: Clinical Measurements: Goal: Ability to maintain clinical measurements within normal limits will improve Outcome: Progressing Goal: Will remain free from infection Outcome: Progressing Goal: Diagnostic test results will improve Outcome: Progressing Goal: Respiratory complications will improve Outcome: Progressing Goal: Cardiovascular complication will be avoided Outcome: Progressing   Problem: Activity: Goal: Risk for activity intolerance will decrease Outcome: Progressing   Problem: Nutrition: Goal: Adequate nutrition will be maintained Outcome: Progressing   Problem: Coping: Goal: Level of anxiety will decrease Outcome: Progressing   Problem: Elimination: Goal: Will not experience complications related to bowel motility Outcome: Progressing Goal: Will not experience complications related to urinary retention Outcome: Progressing   Problem: Pain Managment: Goal:  General experience of comfort will improve Outcome: Progressing   Problem: Safety: Goal: Ability to remain free from injury will improve Outcome: Progressing   Problem: Skin Integrity: Goal: Risk for impaired skin integrity will decrease Outcome: Progressing   Problem: Education: Goal: Ability to describe self-care measures that may prevent or decrease complications (Diabetes Survival Skills Education) will improve Outcome: Progressing Goal: Individualized Educational Video(s) Outcome: Progressing   Problem: Coping: Goal: Ability to adjust to condition or change in health will improve Outcome: Progressing   Problem: Fluid Volume: Goal: Ability to maintain a balanced intake and output will improve Outcome: Progressing   Problem: Health Behavior/Discharge Planning: Goal: Ability to identify and utilize available resources and services will improve Outcome: Progressing Goal: Ability to manage health-related needs will improve Outcome: Progressing   Problem: Metabolic: Goal: Ability to maintain appropriate glucose levels will improve Outcome: Progressing   Problem: Nutritional: Goal: Maintenance of adequate nutrition will improve Outcome: Progressing Goal: Progress toward achieving an optimal weight will improve Outcome: Progressing   Problem: Skin Integrity: Goal: Risk for impaired skin integrity will decrease Outcome: Progressing   Problem: Tissue Perfusion: Goal: Adequacy of tissue perfusion will improve Outcome: Progressing   Problem: Education: Goal: Ability to verbalize activity precautions or restrictions will improve Outcome: Progressing Goal: Knowledge of the prescribed therapeutic regimen will improve Outcome: Progressing Goal: Understanding of discharge needs will improve Outcome: Progressing   Problem: Activity: Goal: Ability to avoid complications of mobility impairment will improve Outcome: Progressing Goal: Ability to tolerate increased activity  will improve Outcome: Progressing Goal: Will remain free from falls Outcome: Progressing   Problem: Bowel/Gastric: Goal: Gastrointestinal status for postoperative course will improve Outcome: Progressing   Problem: Clinical Measurements: Goal: Ability to maintain clinical measurements within normal limits will improve Outcome: Progressing Goal: Postoperative complications will be avoided or minimized Outcome: Progressing Goal: Diagnostic test results will improve Outcome: Progressing   Problem: Pain Management: Goal: Pain level will decrease Outcome: Progressing   Problem: Skin Integrity: Goal: Will show signs of wound healing Outcome: Progressing   Problem: Health Behavior/Discharge   Planning: Goal: Identification of resources available to assist in meeting health care needs will improve Outcome: Progressing   Problem: Bladder/Genitourinary: Goal: Urinary functional status for postoperative course will improve Outcome: Progressing   

## 2022-09-11 NOTE — Progress Notes (Incomplete)
Palliative Medicine Progress Note   Patient Name: David [name]       Date: 09/11/2022 DOB: 1960/05/31  Age: 62 y.o. MRN#: [id number] Attending Physician: Rodolph Bong, MD Primary Care Physician: [name], MD Admit Date: 08/20/2022  Reason for Consultation/Follow-up: {Reason for Consult:23484}  HPI/Patient Profile: 62 y.o. male  with history of chronic CHF, persistent atrial fibrillation on anticoagulation, and T2DM. 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 He was discharged home 08/19/22 with po doxycycline and po pain medication (Oxycodone IR and Oxycontin). He was readmitted on 08/20/2022 with hypoglycemia.  Patient was hypoglycemic to 38 when EMS arrived.  He was also reporting significant back pain.  MRI thoracic spine concerning for progressive discitis/osteomyelitis at T7-T9. ID consulted and started IV vancomycin.  Neurosurgery consulted, and patient underwent T5-T11 posterior spinal fusion and laminectomy on 5/28.   Palliative Medicine was consulted for pain control and goals of care.  Subjective: Chart reviewed. Per Dr. Carollee Massed progress note today, "patient remains confused despite adjusting pain meds". His sodium has improved, CT head and MRI brain were negative for acute abnormalities, and there is no source of infection.   I spoke with patient's RN. She reports that patient continues to complain that his pain is not well-controlled. He also complained of constipation earlier today, but was given sorbitol and was able to have a BM.   Objective:  Physical Exam Vitals reviewed.  Constitutional:       General: He is sleeping. He is not in acute distress. Pulmonary:     Effort: Pulmonary effort is normal.            LBM: Last BM Date : 09/10/22  Palliative Medicine Assessment & Plan   Assessment: Principal Problem:   Hypoglycemia Active Problems:   Essential hypertension   Insulin dependent type 2 diabetes mellitus (HCC)   Persistent atrial fibrillation (HCC)   Pure hypercholesterolemia   Cellulitis in diabetic foot (HCC)   Diskitis   Hypokalemia   Hypotension   Constipation   Acute metabolic encephalopathy   Acute postoperative anemia due to expected blood loss    Recommendations/Plan: Continue current interventions Appreciate ongoing support from spiritual care  PMT  will continue to follow  Symptom Management:  Code Status:   Prognosis:  {Palliative Care Prognosis:23504}  Discharge Planning: {Palliative dispostion:23505}  Care plan was discussed with ***  Thank you for allowing the Palliative Medicine Team to assist in the care of this patient.   ***   Merry Proud, NP   Please contact Palliative Medicine Team phone at [phone] for questions and concerns.  For individual providers, please see AMION.

## 2022-09-11 NOTE — Plan of Care (Signed)
  Problem: Education: Goal: Knowledge of General Education information will improve Description: Including pain rating scale, medication(s)/side effects and non-pharmacologic comfort measures Outcome: Progressing   Problem: Health Behavior/Discharge Planning: Goal: Ability to manage health-related needs will improve Outcome: Progressing   Problem: Activity: Goal: Risk for activity intolerance will decrease Outcome: Progressing   Problem: Pain Managment: Goal: General experience of comfort will improve Outcome: Progressing   Problem: Skin Integrity: Goal: Risk for impaired skin integrity will decrease Outcome: Progressing   

## 2022-09-11 NOTE — Progress Notes (Signed)
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 Chestine Spore on 4/25 persistent A-fib on Eliquis, DM-2, seizure disorder, HTN, HLD and chronic pain return to ED with hypoglycemia to 38.  Patient was hospitalized for disseminated MRSA bacteremia from 4/9-5/17, and discharged on p.o. doxycycline.  Patient was hypoglycemic to 38 when EMS arrived.  His hypoglycemia persisted despite intervention in ED and leading to his rehospitalization.  He also have significant back pain.  MRI thoracic spine concerning for progressive discitis/osteomyelitis at T7-T9, extensive phlegmonous change in the paravertebral soft tissues,  partial collapse of T8 vertebral body with 40% vertebral body height loss and approximately 6 mm bony retropulsion, moderate canal stenosis and mild cord edema.  Neurosurgery and ID consulted.   Patient underwent surgical decompression by  neurosurgery. Palliative medicine helping with pain management.  Therapy recommended CIR, unfortunately, pt does not have caregiver support, hence SNF is now recommended. TOC  on board and assisting with disposition.    Patient remains confused despite adjusting pain meds. His sodium has improved. CT head and MRI brain did not reveal anything acute. Neuro exam is grossly normal except for his mental status. No source of infection.  Adjusting pain meds.    Assessment & Plan:   Principal Problem:   Hypoglycemia Active Problems:   Essential hypertension   Insulin dependent type 2 diabetes mellitus (HCC)   Persistent atrial fibrillation (HCC)   Pure hypercholesterolemia   Cellulitis in diabetic foot (HCC)   Diskitis   Hypokalemia   Hypotension    Constipation   Acute metabolic encephalopathy   Acute postoperative anemia due to expected blood loss  #1 disseminated MRSA bacteremia with T7-T9 discitis and osteomyelitis -MRI done of patient's back was concerning for progressive discitis/osteomyelitis. -Patient noted to have recent hospitalization from 4/9-5/17 and noted to have been discharged on doxycycline. -Patient noted to have previously been treated with daptomycin followed by vancomycin and oritavancin. -Patient seen by ID and neurosurgery during this hospitalization. -Patient underwent surgical intervention 5/29 per neurosurgery, JP drain subsequently removed. -ID following and recommended daptomycin to start 5/29 and oral doxycycline to start on 6/26/ 2024. -Palliative care following and helping manage patient's pain. -Continue Robaxin 750 mg every 6 hours as needed, Toradol 50 mg IV every 8 hours as needed, Lyrica 75 mg nightly, Lidoderm patch, celecoxib 100 mg twice daily, BuSpar 15 mg twice daily, fentanyl patch 50 mcg every 72 hours, oxycodone 10 mg every 4 hours as needed. -Continue bowel regimen.  2.  Right foot osteomyelitis status post right fifth ray amputation 4/30 -Continue current antibiotic regimen. -WBAT. -Will need outpatient follow-up with orthopedics.  3.  Chronic systolic CHF -Lasix on hold secondary to hyponatremia.   -Patient seems to be auto diuresing. -Patient with urine output of 1.450 L over the past 24 hours.   -Monitor volume status with hydration.    4.  Hypomagnesemia/hypokalemia -Repleted.   5.  Hypertension -Continue Coreg.   -Lasix on hold secondary to hyponatremia.   -Pain also likely playing a role in elevated blood pressure.  6.  Acute metabolic encephalopathy -Felt secondary to pain meds versus hyponatremia versus withdrawal. -It is noted that patient did have some intermittent hallucinations. -Ammonia  level at 38. -CT head without any acute abnormalities. -MRI brain negative for  acute abnormalities. -Clinical improvement.  -Likely at baseline.   7.  Persistent A-fib -Coreg for rate control.  -Per prior physician anticoagulation to be held for 1 week per neurosurgery. -Eliquis resumed 09/08/2022.  8.  Moderate pleural effusion -Stable. -Patient seems to be auto diuresing. -Continue to hold Lasix.   9.  Constipation -Improved on current bowel regimen, and sorbitol every 3 hours x 2 doses which was given on 09/10/2022.  10.  Hypotension -Resolved.  11.  Postop acute blood loss anemia -Likely loss postoperatively. -Started posttransfusion 1 unit PRBCs hemoglobin currently stable at 8.7. -Follow H&H. -Transfusion threshold hemoglobin < 7.  12.  AKI -Felt likely secondary to prerenal azotemia secondary to hypotension and dehydration. -Patient with urine output of 1.450 L over the past 24 hours. -Resolved.  13.  Hyponatremia -??  Etiology. -Different show includes secondary to volume overload versus poor oral intake. -SIADH ruled out. -Urine sodium less than 10, TSH within normal limits, serum osmolality 287, serum cortisol is 12. -Sodium levels improving with holding Lasix and placing on IV fluids. -Urine output of 1.450 L over the past 24 hours. -Continue to hold Lasix.   -Repeat labs in the AM.  14.  Diabetes mellitus type 2 -Hemoglobin A1c 9.1 (07/13/2022). -CBG 90 this morning. -Continue Semglee 20 units daily. -Continue SSI. -Meal coverage NovoLog discontinued.   DVT prophylaxis: Eliquis Code Status: Full Family Communication: Updated patient.  No family at bedside. Disposition: SNF when bed available.  Status is: Inpatient Remains inpatient appropriate because: Severity of illness   Consultants:  ID: Dr. Thedore Mins 08/23/2022 Wound care RN, Ria Bush 08/24/2022 Palliative care: Richrd Humbles 08/26/2022  Procedures:  CT angiogram chest 08/22/2022 CT chest 09/05/2022 CT head 09/03/2022 Chest x-ray 08/20/2022, 09/01/2022, PICC line placement  09/01/2022 Plan films of the T-spine 08/30/2022 MRI of the T-spine 08/22/2022 MRI of the brain 09/05/2022 T5-6, T6-7, T7-8, T8-9, T9-10, T10-11 posterior/posterior lateral arthrodesis/transpedicular decompression with partial corpectomy, left T8/T8-9 laminectomy, bilateral facetectomy/segmental instrumentation with percutaneously placed pedicle screw and rod construct at T5 to 6-7 to 8-9 to 10-11/Harvest of local autograft/use of morselized allograft/intraoperative neuronavigation with [facility]/intraoperative CT-Per neurosurgery: Dr. Maisie Fus 08/30/2022  Antimicrobials:  Anti-infectives (From admission, onward)    Start     Dose/Rate Route Frequency Ordered Stop   09/28/22 1000  doxycycline (VIBRA-TABS) tablet 100 mg        100 mg Oral Every 12 hours 09/01/22 1113     08/31/22 2000  DAPTOmycin (CUBICIN) 650 mg in sodium chloride 0.9 % IVPB        8 mg/kg  83.3 kg 126 mL/hr over 30 Minutes Intravenous Daily 08/31/22 1053 09/27/22 2359   08/30/22 2300  vancomycin (VANCOREADY) IVPB 750 mg/150 mL  Status:  Discontinued        750 mg 150 mL/hr over 60 Minutes Intravenous Every 12 hours 08/30/22 1150 08/31/22 1053   08/30/22 1712  vancomycin (VANCOCIN) powder  Status:  Discontinued          As needed 08/30/22 1714 08/30/22 1756   08/30/22 1142  ceFAZolin (ANCEF) 2-4 GM/100ML-% IVPB  Status:  Discontinued       Note to Pharmacy: [facility], [place]: cabinet override      08/30/22 1142 08/30/22 1300   08/30/22 0600  ceFAZolin (ANCEF) IVPB 2g/100 mL premix        2 g 200 mL/hr over 30 Minutes Intravenous On call to O.R. 08/29/22 4034  08/30/22 1401   08/27/22 1041  vancomycin (VANCOCIN) IVPB 1000 mg/200 mL premix  Status:  Discontinued        1,000 mg 200 mL/hr over 60 Minutes Intravenous Every 12 hours 08/27/22 0802 08/30/22 1150   08/26/22 2300  vancomycin (VANCOREADY) IVPB 1250 mg/250 mL  Status:  Discontinued        1,250 mg 166.7 mL/hr over 90 Minutes Intravenous Every 24 hours 08/26/22 1014 08/27/22  0802   08/23/22 2200  vancomycin (VANCOREADY) IVPB 750 mg/150 mL  Status:  Discontinued        750 mg 150 mL/hr over 60 Minutes Intravenous Every 12 hours 08/23/22 0938 08/26/22 1014   08/23/22 1030  vancomycin (VANCOREADY) IVPB 1500 mg/300 mL        1,500 mg 150 mL/hr over 120 Minutes Intravenous  Once 08/23/22 0938 08/23/22 1312   08/20/22 2200  doxycycline (VIBRA-TABS) tablet 100 mg  Status:  Discontinued        100 mg Oral 2 times daily 08/20/22 1737 08/23/22 1610         Subjective: Patient states had significant bowel movement yesterday after sorbitol.  No chest pain.  No shortness of breath.  No abdominal pain.   Objective: Vitals:   09/11/22 0011 09/11/22 0319 09/11/22 0817 09/11/22 1125  BP: 134/86 (!) 134/93 (!) 142/92 (!) 136/91  Pulse: (!) 51  67 67  Resp: 17 17 19 19   Temp: 98 F (36.7 C) 97.8 F (36.6 C) 98.2 F (36.8 C) 98.4 F (36.9 C)  TempSrc: Oral Oral Oral Oral  SpO2: 93% 100% 94%   Weight:      Height:        Intake/Output Summary (Last 24 hours) at 09/11/2022 1252 Last data filed at 09/11/2022 0830 Gross per 24 hour  Intake --  Output 2200 ml  Net -2200 ml    Filed Weights   08/20/22 1821 08/23/22 2307  Weight: 77.8 kg 83.3 kg    Examination:  General exam: NAD. Honeycomb dressing on back c/d/i Respiratory system: CTAB anterior lung fields.  No wheezes, no crackles, no rhonchi.  Fair air movement.  Speaking in full sentences. Cardiovascular system: RRR no murmurs rubs or gallops.  No JVD.  No lower extremity edema.  Gastrointestinal system: Abdomen is soft, nontender, nondistended, positive bowel sounds.  No rebound.  No guarding.  Central nervous system: Alert and oriented.  No focal neurological deficits.   Extremities: Symmetric 5 x 5 power. Skin: No rashes, lesions or ulcers Psychiatry: Judgement and insight appear normal. Mood & affect appropriate.     Data Reviewed: I have personally reviewed following labs and imaging  studies  CBC: Recent Labs  Lab 09/07/22 0530 09/08/22 0410 09/09/22 0510 09/10/22 0430  WBC 8.2 9.5 8.6 9.1  NEUTROABS 5.5  --   --   --   HGB 8.1* 8.8* 8.9* 8.7*  HCT 25.8* 27.3* 27.3* 27.3*  MCV 89.3 88.6 90.1 89.8  PLT 304 316 304 273     Basic Metabolic Panel: Recent Labs  Lab 09/07/22 0530 09/08/22 0410 09/09/22 0510 09/10/22 0430 09/11/22 0600  NA 129* 126* 130* 130* 128*  K 4.3 4.6 4.4 4.5 4.3  CL 98 94* 99 97* 98  CO2 24 23 23 23 23   GLUCOSE 190* 165* 136* 88 97  BUN 13 17 16 16 12   CREATININE 1.08 1.13 1.23 1.20 1.15  CALCIUM 8.9 8.9 8.9 8.8* 8.8*  MG  --  1.8 1.8  --  1.9  GFR: Estimated Creatinine Clearance: 75 mL/min (by C-G formula based on SCr of 1.15 mg/dL).  Liver Function Tests: No results for input(s): "AST", "ALT", "ALKPHOS", "BILITOT", "PROT", "ALBUMIN" in the last 168 hours.  CBG: Recent Labs  Lab 09/10/22 1159 09/10/22 1626 09/10/22 2104 09/11/22 0716 09/11/22 1122  GLUCAP 149* 178* 121* 90 92      No results found for this or any previous visit (from the past 240 hour(s)).        Radiology Studies: No results found.      Scheduled Meds:  sodium chloride   Intravenous Once   acetaminophen  1,000 mg Oral TID   alteplase  2 mg Intracatheter Once   apixaban  5 mg Oral BID   atorvastatin  80 mg Oral Daily   busPIRone  15 mg Oral BID   carvedilol  3.125 mg Oral BID   celecoxib  100 mg Oral BID   Chlorhexidine Gluconate Cloth  6 each Topical Daily   digoxin  125 mcg Oral Daily   [START ON 09/28/2022] doxycycline  100 mg Oral Q12H   DULoxetine  20 mg Oral Daily   famotidine  20 mg Oral BID   feeding supplement  237 mL Oral BID BM   fentaNYL  1 patch Transdermal Q48H   fluticasone  1 spray Each Nare Daily   insulin aspart  0-5 Units Subcutaneous QHS   insulin aspart  0-9 Units Subcutaneous TID WC   insulin glargine-yfgn  20 Units Subcutaneous Daily   lidocaine  1 patch Transdermal Q24H   magnesium oxide  400  mg Oral BID   pantoprazole  40 mg Oral Daily   polyethylene glycol  17 g Oral BID   pregabalin  75 mg Oral QHS   senna-docusate  1 tablet Oral BID   sodium chloride flush  10-40 mL Intracatheter Q12H   sodium chloride flush  3 mL Intravenous Q12H   Continuous Infusions:  sodium chloride Stopped (09/01/22 1621)   DAPTOmycin (CUBICIN) 650 mg in sodium chloride 0.9 % IVPB 650 mg (09/10/22 1416)     LOS: 19 days    Time spent: 35 minutes    Ramiro Harvest, MD [facility]   To contact the attending provider between 7A-7P or the covering provider during after hours 7P-7A, please log into the web site [email or web address] and access using universal [place] password for that web site. If you do not have the password, please call the hospital operator.  09/11/2022, 12:52 PM

## 2022-09-12 DIAGNOSIS — R4589 Other symptoms and signs involving emotional state: Secondary | ICD-10-CM | POA: Diagnosis not present

## 2022-09-12 DIAGNOSIS — M4644 Discitis, unspecified, thoracic region: Secondary | ICD-10-CM | POA: Diagnosis not present

## 2022-09-12 DIAGNOSIS — Z515 Encounter for palliative care: Secondary | ICD-10-CM | POA: Diagnosis not present

## 2022-09-12 DIAGNOSIS — I4819 Other persistent atrial fibrillation: Secondary | ICD-10-CM | POA: Diagnosis not present

## 2022-09-12 DIAGNOSIS — M546 Pain in thoracic spine: Secondary | ICD-10-CM | POA: Diagnosis not present

## 2022-09-12 DIAGNOSIS — E876 Hypokalemia: Secondary | ICD-10-CM | POA: Diagnosis not present

## 2022-09-12 DIAGNOSIS — I10 Essential (primary) hypertension: Secondary | ICD-10-CM | POA: Diagnosis not present

## 2022-09-12 DIAGNOSIS — E162 Hypoglycemia, unspecified: Secondary | ICD-10-CM | POA: Diagnosis not present

## 2022-09-12 LAB — GLUCOSE, CAPILLARY
Glucose-Capillary: 135 mg/dL — ABNORMAL HIGH (ref 70–99)
Glucose-Capillary: 141 mg/dL — ABNORMAL HIGH (ref 70–99)
Glucose-Capillary: 156 mg/dL — ABNORMAL HIGH (ref 70–99)
Glucose-Capillary: 178 mg/dL — ABNORMAL HIGH (ref 70–99)

## 2022-09-12 LAB — BASIC METABOLIC PANEL
Anion gap: 9 (ref 5–15)
BUN: 12 mg/dL (ref 6–20)
CO2: 23 mmol/L (ref 22–32)
Calcium: 8.6 mg/dL — ABNORMAL LOW (ref 8.9–10.3)
Chloride: 98 mmol/L (ref 98–111)
Creatinine, Ser: 1.21 mg/dL (ref 0.61–1.24)
GFR, Estimated: >60 mL/min (ref >60)
Glucose, Bld: 153 mg/dL — ABNORMAL HIGH (ref 70–99)
Potassium: 4.8 mmol/L (ref 3.5–5.1)
Sodium: 130 mmol/L — ABNORMAL LOW (ref 135–145)

## 2022-09-12 LAB — MAGNESIUM: Magnesium: 1.9 mg/dL (ref 1.7–2.4)

## 2022-09-12 NOTE — Progress Notes (Signed)
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 Chestine Spore on 4/25 persistent A-fib on Eliquis, DM-2, seizure disorder, HTN, HLD and chronic pain return to ED with hypoglycemia to 38.  Patient was hospitalized for disseminated MRSA bacteremia from 4/9-5/17, and discharged on p.o. doxycycline.  Patient was hypoglycemic to 38 when EMS arrived.  His hypoglycemia persisted despite intervention in ED and leading to his rehospitalization.  He also have significant back pain.  MRI thoracic spine concerning for progressive discitis/osteomyelitis at T7-T9, extensive phlegmonous change in the paravertebral soft tissues,  partial collapse of T8 vertebral body with 40% vertebral body height loss and approximately 6 mm bony retropulsion, moderate canal stenosis and mild cord edema.  Neurosurgery and ID consulted.   Patient underwent surgical decompression by  neurosurgery. Palliative medicine helping with pain management.  Therapy recommended CIR, unfortunately, pt does not have caregiver support, hence SNF is now recommended. TOC  on board and assisting with disposition.    Patient remains confused despite adjusting pain meds. His sodium has improved. CT head and MRI brain did not reveal anything acute. Neuro exam is grossly normal except for his mental status. No source of infection.  Adjusting pain meds.    Assessment & Plan:   Principal Problem:   Hypoglycemia Active Problems:   Essential hypertension   Insulin dependent type 2 diabetes mellitus (HCC)   Persistent atrial fibrillation (HCC)   Pure hypercholesterolemia   Cellulitis in diabetic foot (HCC)   Diskitis   Hypokalemia   Hypotension    Constipation   Acute metabolic encephalopathy   Acute postoperative anemia due to expected blood loss  #1 disseminated MRSA bacteremia with T7-T9 discitis and osteomyelitis -MRI done of patient's back was concerning for progressive discitis/osteomyelitis. -Patient noted to have recent hospitalization from 4/9-5/17 and noted to have been discharged on doxycycline. -Patient noted to have previously been treated with daptomycin followed by vancomycin and oritavancin. -Patient seen by ID and neurosurgery during this hospitalization. -Patient underwent surgical intervention 5/29 per neurosurgery, JP drain subsequently removed. -ID following and recommended daptomycin to start 5/29 and oral doxycycline to start on 6/26/ 2024. -Palliative care following and helping manage patient's pain. -Continue Robaxin 750 mg every 6 hours as needed, Toradol 50 mg IV every 8 hours as needed, Lyrica 75 mg nightly, Lidoderm patch, celecoxib 100 mg twice daily, BuSpar 15 mg twice daily, fentanyl patch 50 mcg every 72 hours, oxycodone 10 mg every 4 hours as needed. -Continue bowel regimen.  2.  Right foot osteomyelitis status post right fifth ray amputation 4/30 -Continue current antibiotic regimen. -WBAT. -Will need outpatient follow-up with orthopedics.  3.  Chronic systolic CHF -Lasix on hold secondary to hyponatremia.   -Patient seems to be auto diuresing. -Patient with urine output of 2.8 L over the past 24 hours.   -Monitor volume status with hydration.    4.  Hypomagnesemia/hypokalemia -Repleted.   5.  Hypertension -Lasix on hold secondary to hyponatremia.   -Pain likely contributing to elevated blood pressure.   -Continue Coreg.    6.  Acute metabolic encephalopathy -Felt secondary to pain meds versus hyponatremia versus withdrawal. -It is noted that patient did have some intermittent hallucinations. -Ammonia level  at 109. -CT head without any acute abnormalities. -MRI brain negative for acute  abnormalities. -Clinical improvement.  -Likely at baseline.   7.  Persistent A-fib -Continue Coreg for rate control.  -Per prior physician anticoagulation to be held for 1 week per neurosurgery. -Eliquis resumed 09/08/2022.  8.  Moderate pleural effusion -Stable. -Patient seems to be auto diuresing. -Continue to hold Lasix.   9.  Constipation -Improved on current bowel regimen, and sorbitol every 3 hours x 2 doses which was given on 09/10/2022.  10.  Hypotension -Resolved.  11.  Postop acute blood loss anemia -Likely loss postoperatively. -Started posttransfusion 1 unit PRBCs hemoglobin currently stable at 8.7. -Follow H&H. -Transfusion threshold hemoglobin < 7.  12.  AKI -Felt likely secondary to prerenal azotemia secondary to hypotension and dehydration. -Patient with urine output of 2.8 L over the past 24 hours. -Resolved.  13.  Hyponatremia -??  Etiology. -Different show includes secondary to volume overload versus poor oral intake. -SIADH ruled out. -Urine sodium less than 10, TSH within normal limits, serum osmolality 287, serum cortisol is 12. -Sodium levels improving with holding Lasix and placing on IV fluids. -Urine output of 2.8 L over the past 24 hours. -Continue to hold Lasix.   -Continue IV fluids for another 24 hours. -Repeat labs in the AM.  14.  Diabetes mellitus type 2 -Hemoglobin A1c 9.1 (07/13/2022). -CBG 135 this morning. -Continue Semglee 20 units daily. -Continue SSI. -Meal coverage NovoLog discontinued.   DVT prophylaxis: Eliquis Code Status: Full Family Communication: Updated patient.  No family at bedside. Disposition: SNF when bed available.  Status is: Inpatient Remains inpatient appropriate because: Severity of illness   Consultants:  ID: Dr. Thedore Mins 08/23/2022 Wound care RN, Ria Bush 08/24/2022 Palliative care: Richrd Humbles 08/26/2022  Procedures:  CT angiogram chest 08/22/2022 CT chest 09/05/2022 CT head 09/03/2022 Chest x-ray  08/20/2022, 09/01/2022, PICC line placement 09/01/2022 Plan films of the T-spine 08/30/2022 MRI of the T-spine 08/22/2022 MRI of the brain 09/05/2022 T5-6, T6-7, T7-8, T8-9, T9-10, T10-11 posterior/posterior lateral arthrodesis/transpedicular decompression with partial corpectomy, left T8/T8-9 laminectomy, bilateral facetectomy/segmental instrumentation with percutaneously placed pedicle screw and rod construct at T5 to 6-7 to 8-9 to 10-11/Harvest of local autograft/use of morselized allograft/intraoperative neuronavigation with [facility]/intraoperative CT-Per neurosurgery: Dr. Maisie Fus 08/30/2022  Antimicrobials:  Anti-infectives (From admission, onward)    Start     Dose/Rate Route Frequency Ordered Stop   09/28/22 1000  doxycycline (VIBRA-TABS) tablet 100 mg        100 mg Oral Every 12 hours 09/01/22 1113     08/31/22 2000  DAPTOmycin (CUBICIN) 650 mg in sodium chloride 0.9 % IVPB        8 mg/kg  83.3 kg 126 mL/hr over 30 Minutes Intravenous Daily 08/31/22 1053 09/27/22 2359   08/30/22 2300  vancomycin (VANCOREADY) IVPB 750 mg/150 mL  Status:  Discontinued        750 mg 150 mL/hr over 60 Minutes Intravenous Every 12 hours 08/30/22 1150 08/31/22 1053   08/30/22 1712  vancomycin (VANCOCIN) powder  Status:  Discontinued          As needed 08/30/22 1714 08/30/22 1756   08/30/22 1142  ceFAZolin (ANCEF) 2-4 GM/100ML-% IVPB  Status:  Discontinued       Note to Pharmacy: [facility], [place]: cabinet override      08/30/22 1142 08/30/22 1300   08/30/22 0600  ceFAZolin (ANCEF) IVPB 2g/100 mL premix        2 g 200 mL/hr over 30 Minutes  Intravenous On call to O.R. 08/29/22 1848 08/30/22 1401   08/27/22 1041  vancomycin (VANCOCIN) IVPB 1000 mg/200 mL premix  Status:  Discontinued        1,000 mg 200 mL/hr over 60 Minutes Intravenous Every 12 hours 08/27/22 0802 08/30/22 1150   08/26/22 2300  vancomycin (VANCOREADY) IVPB 1250 mg/250 mL  Status:  Discontinued        1,250 mg 166.7 mL/hr over 90 Minutes  Intravenous Every 24 hours 08/26/22 1014 08/27/22 0802   08/23/22 2200  vancomycin (VANCOREADY) IVPB 750 mg/150 mL  Status:  Discontinued        750 mg 150 mL/hr over 60 Minutes Intravenous Every 12 hours 08/23/22 0938 08/26/22 1014   08/23/22 1030  vancomycin (VANCOREADY) IVPB 1500 mg/300 mL        1,500 mg 150 mL/hr over 120 Minutes Intravenous  Once 08/23/22 0938 08/23/22 1312   08/20/22 2200  doxycycline (VIBRA-TABS) tablet 100 mg  Status:  Discontinued        100 mg Oral 2 times daily 08/20/22 1737 08/23/22 0927         Subjective: Sitting up in bed.  Having bowel movements.  No chest pain.  No shortness of breath.  No abdominal pain.  Pain currently controlled on current regimen.   Objective: Vitals:   09/12/22 0856 09/12/22 1132 09/12/22 1137 09/12/22 1600  BP: (!) 142/99  119/80 (!) 144/85  Pulse: (!) 56 60 60 62  Resp: 18  20 20   Temp: 97.7 F (36.5 C)  98.3 F (36.8 C) 97.7 F (36.5 C)  TempSrc: Oral  Oral Oral  SpO2: 92%  92% 91%  Weight:      Height:        Intake/Output Summary (Last 24 hours) at 09/12/2022 1828 Last data filed at 09/12/2022 1220 Gross per 24 hour  Intake 1418.24 ml  Output 2550 ml  Net -1131.76 ml    Filed Weights   08/20/22 1821 08/23/22 2307  Weight: 77.8 kg 83.3 kg    Examination:  General exam: NAD. Honeycomb dressing on back c/d/i Respiratory system: Lungs clear to auscultation bilaterally anterior lung fields.  No wheezes, no crackles, no rhonchi.  Fair air movement.  Speaking in full sentences.   Cardiovascular system: Regular rate rhythm no murmurs rubs or gallops.  No JVD.  No lower extremity edema. Gastrointestinal system: Abdomen soft, nontender, nondistended, positive bowel sounds.  No rebound.  No guarding.  Central nervous system: Alert and oriented.  No focal neurological deficits.   Extremities: Symmetric 5 x 5 power. Skin: No rashes, lesions or ulcers Psychiatry: Judgement and insight appear normal. Mood & affect  appropriate.     Data Reviewed: I have personally reviewed following labs and imaging studies  CBC: Recent Labs  Lab 09/07/22 0530 09/08/22 0410 09/09/22 0510 09/10/22 0430  WBC 8.2 9.5 8.6 9.1  NEUTROABS 5.5  --   --   --   HGB 8.1* 8.8* 8.9* 8.7*  HCT 25.8* 27.3* 27.3* 27.3*  MCV 89.3 88.6 90.1 89.8  PLT 304 316 304 273     Basic Metabolic Panel: Recent Labs  Lab 09/08/22 0410 09/09/22 0510 09/10/22 0430 09/11/22 0600 09/12/22 0503  NA 126* 130* 130* 128* 130*  K 4.6 4.4 4.5 4.3 4.8  CL 94* 99 97* 98 98  CO2 23 23 23 23 23   GLUCOSE 165* 136* 88 97 153*  BUN 17 16 16 12 12   CREATININE 1.13 1.23 1.20 1.15 1.21  CALCIUM  8.9 8.9 8.8* 8.8* 8.6*  MG 1.8 1.8  --  1.9 1.9     GFR: Estimated Creatinine Clearance: 71.3 mL/min (by C-G formula based on SCr of 1.21 mg/dL).  Liver Function Tests: No results for input(s): "AST", "ALT", "ALKPHOS", "BILITOT", "PROT", "ALBUMIN" in the last 168 hours.  CBG: Recent Labs  Lab 09/11/22 1122 09/11/22 1624 09/11/22 2105 09/12/22 0620 09/12/22 1159  GLUCAP 92 119* 178* 135* 141*      No results found for this or any previous visit (from the past 240 hour(s)).        Radiology Studies: No results found.      Scheduled Meds:  sodium chloride   Intravenous Once   acetaminophen  1,000 mg Oral TID   alteplase  2 mg Intracatheter Once   apixaban  5 mg Oral BID   atorvastatin  80 mg Oral Daily   busPIRone  15 mg Oral BID   carvedilol  3.125 mg Oral BID   celecoxib  100 mg Oral BID   Chlorhexidine Gluconate Cloth  6 each Topical Daily   digoxin  125 mcg Oral Daily   [START ON 09/28/2022] doxycycline  100 mg Oral Q12H   DULoxetine  20 mg Oral Daily   famotidine  20 mg Oral BID   feeding supplement  237 mL Oral BID BM   fentaNYL  1 patch Transdermal Q48H   fluticasone  1 spray Each Nare Daily   insulin aspart  0-5 Units Subcutaneous QHS   insulin aspart  0-9 Units Subcutaneous TID WC   insulin glargine-yfgn   20 Units Subcutaneous Daily   lidocaine  1 patch Transdermal Q24H   magnesium oxide  400 mg Oral BID   pantoprazole  40 mg Oral Daily   polyethylene glycol  17 g Oral BID   pregabalin  75 mg Oral QHS   senna-docusate  1 tablet Oral BID   sodium chloride flush  10-40 mL Intracatheter Q12H   sodium chloride flush  3 mL Intravenous Q12H   Continuous Infusions:  sodium chloride Stopped (09/01/22 1621)   sodium chloride 125 mL/hr at 09/12/22 0214   DAPTOmycin (CUBICIN) 650 mg in sodium chloride 0.9 % IVPB 650 mg (09/12/22 1530)     LOS: 20 days    Time spent: 35 minutes    Ramiro Harvest, MD [facility]   To contact the attending provider between 7A-7P or the covering provider during after hours 7P-7A, please log into the web site [email or web address] and access using universal [place] password for that web site. If you do not have the password, please call the hospital operator.  09/12/2022, 6:28 PM

## 2022-09-12 NOTE — TOC Progression Note (Signed)
Transition of Care [facility]) - Progression Note    Patient Details  Name: David [name] MRN: [id number] Date of Birth: 01-11-1961  Transition of [facility]) CM/SW Contact  Baldemar Lenis, [place] Phone Number: 09/12/2022, 2:06 PM  Clinical Narrative:   CSW received call from patient's wife asking for another letter for an extension of patient's court hearing for eviction, patient was only extended for a week. CSW attempted to document that patient will be hospitalized and then in rehabilitation for a significant length of time, per lawyer request, to attempt to continue court date for longer than a week. CSW sent letter to wife. CSW to follow.    Expected Discharge Plan: Skilled Nursing Facility Barriers to Discharge: Continued Medical Work up, Inadequate or no insurance  Expected Discharge Plan and Services   Discharge Planning Services: CM Consult Post Acute Care Choice: Home Health Living arrangements for the past 2 months: Hotel/Motel                                       Social Determinants of Health (SDOH) Interventions SDOH Screenings   Food Insecurity: Patient Unable To Answer (08/23/2022)  Housing: Patient Unable To Answer (08/23/2022)  Transportation Needs: Patient Unable To Answer (08/23/2022)  Utilities: Patient Unable To Answer (08/23/2022)  Depression (PHQ2-9): Low Risk  (11/12/2021)  Stress: Stress Concern Present (09/26/2017)  Tobacco Use: Medium Risk (09/05/2022)    Readmission Risk Interventions    09/06/2021   12:07 PM  Readmission Risk Prevention Plan  Post Dischage Appt Complete  Medication Screening Complete  Transportation Screening Complete

## 2022-09-12 NOTE — Progress Notes (Signed)
Physical Therapy Treatment Patient Details Name: David [name] MRN: [id number] DOB: [birth year] Today's Date: 09/12/2022   History of Present Illness Pt is a 62 y.o. male who presented 08/20/22  with hypoglycemia. 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 5/28 surgical repair: T5-11 posterior/posterolateral arthrodesis  & Transpedicular decompression with partial corpectomy, left T8 & T8-9 laminectomy, bilateral facetectomy. 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    PT Comments    Pt remains highly motivated to make progress and work with PT/OT. He was able to take a couple steps to the R in order to perform a step pivot transfer from elevated EOB to recliner with RW support and maxAx2 for balance, RW management, bil foot advancement/placement, and L knee blocking during stance phase. He has difficulty knowing where his feet are for safe, good placement when stepping, needing physical guidance at this time. To safely advance gait distance in a future session, he may benefit from utilizing the sara plus with +3 assist. While pt was able to transfer to stand from the elevated EOB to the RW with only modAx1, he needed maxAx2 to transfer to stand from the recliner to the RW, often shifting his weight anteriorly without extending his knees and hips when powering up from a lower surface and thereby requiring extensive assistance to maintain his safety. Will continue to follow acutely.    Recommendations for follow up therapy are one component of a multi-disciplinary discharge planning process, led by the attending physician.  Recommendations may be updated based on patient status,  additional functional criteria and insurance authorization.  Follow Up Recommendations  Can patient physically be transported by private vehicle: No    Assistance Recommended at Discharge Frequent or constant Supervision/Assistance  Patient can return home with the following Assist for transportation;Two people to help with walking and/or transfers;A lot of help with bathing/dressing/bathroom;Assistance with cooking/housework;Direct supervision/assist for financial management;Direct supervision/assist for medications management   Equipment Recommendations  Rolling walker (2 wheels);Wheelchair cushion (measurements PT);Wheelchair (measurements PT);BSC/3in1 (drop arm BSC)    Recommendations for Other Services Rehab consult     Precautions / Restrictions Precautions Precautions: Fall;Back Precaution Booklet Issued: No Required Braces or Orthoses: Spinal Brace Spinal Brace: Thoracolumbosacral orthotic;Applied in sitting position Restrictions Weight Bearing Restrictions: Yes RLE Weight Bearing: Weight bearing as tolerated (per ortho note 09/03/22)     Mobility  Bed Mobility Overal bed mobility: Needs Assistance Bed Mobility: Rolling, Sidelying to Sit Rolling: Supervision Sidelying to sit: Supervision, HOB elevated       General bed mobility comments: increased time but able to come to EOB without assist when utilizing bed rail and HOB elevated; minor cues for back precautions    Transfers Overall transfer level: Needs assistance Equipment used: Rolling walker (2 wheels) Transfers: Bed to chair/wheelchair/BSC, Sit to/from Stand Sit to Stand: From elevated surface, Mod assist, Max assist, +2 physical assistance, +2 safety/equipment   Step pivot transfers: Max assist, +2 safety/equipment, +2 physical assistance       General transfer comment: Initial stand from elevated bed Mod A x 1 with RW blocking and cues for sequencing, x3 reps. Max A x 2 to step to recliner on R side with  manual assist  needed for balance, advancing R foot and LLE stability/assist. Standing from recliner took multiple attempts with RW and up to Max AX  2 with pt tendency to scoot forward rather than stand fully upright, x2 reps. Pt able to pull on sink with increased stability to stand but Max A x 2 still required with knee blocking, x1 rep.    Ambulation/Gait Ambulation/Gait assistance: Max assist, +2 physical assistance, +2 safety/equipment Gait Distance (Feet): 2 Feet Assistive device: Rolling walker (2 wheels) Gait Pattern/deviations: Step-to pattern, Decreased step length - right, Decreased step length - left, Decreased stride length, Decreased dorsiflexion - right, Decreased dorsiflexion - left, Knees buckling, Trunk flexed, Narrow base of support Gait velocity: reduced Gait velocity interpretation: <1.31 ft/sec, indicative of household ambulator   General Gait Details: Pt with poor coordination of R foot when stepping, needing physical assistance for placement. L knee blocked during stance phase due to noted buckling. Pt needing maxAx2 to assist each leg, manage RW, and provide balance stepping to R from bed to recliner.   Stairs             Wheelchair Mobility    Modified Rankin (Stroke Patients Only)       Balance Overall balance assessment: Needs assistance Sitting-balance support: Bilateral upper extremity supported, Feet supported, No upper extremity supported Sitting balance-Leahy Scale: Fair Sitting balance - Comments: Able to reach off BOS to donn brace with min guard   Standing balance support: Bilateral upper extremity supported, During functional activity Standing balance-Leahy Scale: Poor Standing balance comment: heavy reliance on UE support due to L knee buckling. Needs cues to extend hips. Pt tends to hyperextend bil knees though when cued to stand upright with legs straight. Mod-maxAx2 for standing balance                            Cognition  Arousal/Alertness: Awake/alert Behavior During Therapy: WFL for tasks assessed/performed Overall Cognitive Status: Impaired/Different from baseline Area of Impairment: Memory, Problem solving, Attention, Following commands, Awareness                   Current Attention Level: Selective Memory: Decreased short-term memory Following Commands: Follows multi-step commands with increased time Safety/Judgement: Decreased awareness of deficits, Decreased awareness of safety Awareness: Emergent, Anticipatory Problem Solving: Slow processing, Requires verbal cues General Comments: memory deficits at times but overall showing insight into deficits and needs, motivated to improve        Exercises      General Comments General comments (skin integrity, edema, etc.): educated pt on LE exercises with therabands to improve strength      Pertinent Vitals/Pain Pain Assessment Pain Assessment: Faces Faces Pain Scale: Hurts even more Pain Location: back Pain Descriptors / Indicators: Discomfort, Grimacing, Guarding Pain Intervention(s): Limited activity within patient's tolerance, Monitored during session, Premedicated before session, Repositioned    Home Living                          Prior Function            PT Goals (current goals can now be found in the care plan section) Acute Rehab PT Goals Patient Stated Goal: to improve and reduce pain PT Goal Formulation: With patient Time For Goal Achievement: 09/14/22 Potential to Achieve Goals: Good Progress towards PT goals: Progressing toward goals    Frequency    Min 5X/week      PT  Plan Current plan remains appropriate    Co-evaluation PT/OT/SLP Co-Evaluation/Treatment: Yes Reason for Co-Treatment: Complexity of the patient's impairments (multi-system involvement);For patient/therapist safety;To address functional/ADL transfers PT goals addressed during session: Mobility/safety with mobility;Balance;Proper  use of DME OT goals addressed during session: ADL's and self-care      AM-PAC PT "6 Clicks" Mobility   Outcome Measure  Help needed turning from your back to your side while in a flat bed without using bedrails?: A Little Help needed moving from lying on your back to sitting on the side of a flat bed without using bedrails?: A Little Help needed moving to and from a bed to a chair (including a wheelchair)?: Total Help needed standing up from a chair using your arms (e.g., wheelchair or bedside chair)?: Total Help needed to walk in hospital room?: Total Help needed climbing 3-5 steps with a railing? : Total 6 Click Score: 10    End of Session Equipment Utilized During Treatment: Back brace Activity Tolerance: Patient tolerated treatment well Patient left: in chair;with call bell/phone within reach;with chair alarm set Nurse Communication: Mobility status;Need for lift equipment PT Visit Diagnosis: Difficulty in walking, not elsewhere classified (R26.2);Other abnormalities of gait and mobility (R26.89);Muscle weakness (generalized) (M62.81);Pain;Unsteadiness on feet (R26.81) Pain - Right/Left: Right Pain - part of body:  (back)     Time: 1610-9604 PT Time Calculation (min) (ACUTE ONLY): 48 min  Charges:  $Therapeutic Activity: 8-22 mins                     Raymond Gurney, PT, DPT Acute Rehabilitation Services  Office: [phone]    Jewel Baize 09/12/2022, 4:11 PM

## 2022-09-12 NOTE — Progress Notes (Signed)
Palliative Medicine Progress Note   Patient Name: David [name]       Date: 09/12/2022 DOB: 1960-07-07  Age: 62 y.o. MRN#: [id number] Attending Physician: Rodolph Bong, MD Primary Care Physician: [name], MD Admit Date: 08/20/2022  Reason for Consultation/Follow-up: {Reason for Consult:23484}  HPI/Patient Profile: 62 y.o. male  with history of chronic CHF, persistent atrial fibrillation on anticoagulation, and T2DM. 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 He was discharged home 08/19/22 with po doxycycline and po pain medication (Oxycodone IR and Oxycontin). He was readmitted on 08/20/2022 with hypoglycemia.  Patient was hypoglycemic to 38 when EMS arrived.  He was also reporting significant back pain.  MRI thoracic spine concerning for progressive discitis/osteomyelitis at T7-T9. ID consulted and started IV vancomycin.  Neurosurgery consulted, and patient underwent T5-T11 posterior spinal fusion and laminectomy on 5/28.   Palliative Medicine was consulted for pain control and goals of care.  Subjective: Chart reviewed and patient assessed at bedside. He is OOB to the recliner. He is reporting severe back pain; RN notified.   Objective:  Physical Exam          Vital Signs: BP (!) 144/85 (BP Location: Left Arm)   Pulse 62   Temp 97.7 F (36.5 C) (Oral)   Resp 20   Ht 6' (1.829 m)   Wt 83.3 kg   SpO2 91%   BMI 24.91 kg/m  SpO2: SpO2: 91 % O2 Device: O2 Device: Room Air    LBM: Last BM Date : 09/12/22     Palliative Assessment/Data: ***     Palliative Medicine Assessment & Plan    Assessment: Principal Problem:   Hypoglycemia Active Problems:   Essential hypertension   Insulin dependent type 2 diabetes mellitus (HCC)   Persistent atrial fibrillation (HCC)   Pure hypercholesterolemia   Cellulitis in diabetic foot (HCC)   Diskitis   Hypokalemia   Hypotension   Constipation   Acute metabolic encephalopathy   Acute postoperative anemia due to expected blood loss    Recommendations/Plan: ***  Goals of Care and Additional Recommendations: Limitations on Scope of Treatment: {Recommended Scope and Preferences:21019}  Code Status:   Prognosis:  {Palliative Care  Prognosis:23504}  Discharge Planning: {Palliative dispostion:23505}  Care plan was discussed with ***  Thank you for allowing the Palliative Medicine Team to assist in the care of this patient.   ***   Merry Proud, NP   Please contact Palliative Medicine Team phone at [phone] for questions and concerns.  For individual providers, please see [facility].

## 2022-09-12 NOTE — Progress Notes (Signed)
Occupational Therapy Treatment Patient Details Name: David [name] MRN: [id number] DOB: 11/18/1960 Today's Date: 09/12/2022   History of present illness Pt is a 62 y.o. male who presented 08/20/22  with hypoglycemia. 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 5/28 surgical repair: T5-11 posterior/posterolateral arthrodesis  & Transpedicular decompression with partial corpectomy, left T8 & T8-9 laminectomy, bilateral facetectomy. 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 Pt able to stand from elevated surface with Mod A x 1 but required Max A x 2 for pivoting to/standing from recliner. Hands on assist required for balance, advancing R foot and LLE stability/knee blocking. Pt able to stand briefly at sink for basic ADLs but noted with L lateral lean. Pt able to return demo AE use and compensatory strategies for LB ADLs bed level. Based on high fall risk and inability to mobilize with RW, will benefit from trial of Sara+ for gait in next sessions.   Recommendations for follow up therapy are one component of a multi-disciplinary discharge planning process, led by the attending physician.  Recommendations may be updated based on patient status, additional functional criteria and insurance authorization.    Assistance Recommended at Discharge Frequent or constant Supervision/Assistance  Patient can return home with the following  Two people to help with walking and/or transfers;Two people to help with bathing/dressing/bathroom   Equipment Recommendations   BSC/3in1;Wheelchair (measurements OT);Wheelchair cushion (measurements OT)    Recommendations for Other Services Rehab consult    Precautions / Restrictions Precautions Precautions: Fall;Back Required Braces or Orthoses: Spinal Brace Spinal Brace: Thoracolumbosacral orthotic;Applied in sitting position Restrictions Weight Bearing Restrictions: Yes RLE Weight Bearing: Weight bearing as tolerated       Mobility Bed Mobility Overal bed mobility: Needs Assistance Bed Mobility: Rolling, Sidelying to Sit Rolling: Supervision Sidelying to sit: Supervision, HOB elevated       General bed mobility comments: increased time but able to come to EOB without assist; minor cues for back precautions    Transfers Overall transfer level: Needs assistance Equipment used: Rolling walker (2 wheels) Transfers: Bed to chair/wheelchair/BSC, Sit to/from Stand Sit to Stand: From elevated surface, Mod assist, Max assist, +2 physical assistance, +2 safety/equipment     Step pivot transfers: Max assist, +2 safety/equipment, +2 physical assistance     General transfer comment: Initial stand from elevated bed Mod A x 1 with RW blocking and cues for sequencing. Max A x 2 to step to recliner on R side with manual assist needed for balance, advancing R foot and LLE stability/assist. Standing from recliner took multiple attempts with RW and up to Max AX  2 with pt tendency to scoot forward rather than stand fully upright. Pt able to pull on sink with increased stability to stand but Max A x 2 still required with knee blocking     Balance Overall balance assessment: Needs assistance Sitting-balance support: Bilateral upper extremity supported, Feet supported, No upper extremity supported Sitting balance-Leahy Scale: Fair     Standing balance support: Bilateral upper extremity supported, During functional activity Standing balance-Leahy Scale: Poor  ADL either  performed or assessed with clinical judgement   ADL Overall ADL's : Needs assistance/impaired     Grooming: Maximal assistance;Standing;Wash/dry face Grooming Details (indicate cue type and reason): + 2 for safety to stand and wash face briefly at sink; Heavily leaning on sink. when using R UE for tasks, tendency for L lateral lean; B knee blocking on both sides for safety             Lower Body Dressing: Sitting/lateral leans;Bed level;With adaptive equipment;Supervision/safety Lower Body Dressing Details (indicate cue type and reason): able to bring feet to self bed level to don socks. unable to cross LE when sitting in chair due to BLE weakness - able to return demo AE use               General ADL Comments: Focus on standing trials, taking steps with RW and standing at sink. Pt able to don TLSO with Min A    Extremity/Trunk Assessment Upper Extremity Assessment Upper Extremity Assessment: Overall WFL for tasks assessed   Lower Extremity Assessment Lower Extremity Assessment: Defer to PT evaluation        Vision   Vision Assessment?: No apparent visual deficits   Perception     Praxis      Cognition Arousal/Alertness: Awake/alert Behavior During Therapy: WFL for tasks assessed/performed Overall Cognitive Status: Impaired/Different from baseline Area of Impairment: Memory, Problem solving, Attention, Following commands, Awareness                   Current Attention Level: Selective Memory: Decreased short-term memory Following Commands: Follows multi-step commands with increased time Safety/Judgement: Decreased awareness of deficits, Decreased awareness of safety Awareness: Emergent, Anticipatory Problem Solving: Slow processing, Requires verbal cues General Comments: memory deficits at times but overall showing insight into deficits, needs and motivated to improve        Exercises      Shoulder Instructions       General Comments       Pertinent Vitals/ Pain       Pain Assessment Pain Assessment: Faces Faces Pain Scale: Hurts even more Pain Location: back Pain Descriptors / Indicators: Discomfort, Grimacing, Guarding Pain Intervention(s): Monitored during session, Limited activity within patient's tolerance, Premedicated before session, Repositioned  Home Living                                          Prior Functioning/Environment              Frequency  Min 2X/week        Progress Toward Goals  OT Goals(current goals can now be found in the care plan section)  Progress towards OT goals: Progressing toward goals  Acute Rehab OT Goals Patient Stated Goal: walk out of here OT Goal Formulation: With patient Time For Goal Achievement: 09/26/22 Potential to Achieve Goals: Fair  Plan Discharge plan remains appropriate    Co-evaluation    PT/OT/SLP Co-Evaluation/Treatment: Yes Reason for Co-Treatment: Complexity of the patient's impairments (multi-system involvement);For patient/therapist safety;To address functional/ADL transfers   OT goals addressed during session: ADL's and self-care      AM-PAC OT "6 Clicks" Daily Activity     Outcome Measure   Help from another person eating meals?: None Help from another person taking care of personal grooming?: A Little Help from another person toileting, which includes using toliet, bedpan, or urinal?:  Total Help from another person bathing (including washing, rinsing, drying)?: Total Help from another person to put on and taking off regular upper body clothing?: A Lot Help from another person to put on and taking off regular lower body clothing?: A Little 6 Click Score: 14    End of Session Equipment Utilized During Treatment: Gait belt;Rolling walker (2 wheels);Back brace  OT Visit Diagnosis: Unsteadiness on feet (R26.81);Other abnormalities of gait and mobility (R26.89);Muscle weakness (generalized) (M62.81);Pain   Activity  Tolerance Patient tolerated treatment well   Patient Left in chair;with call bell/phone within reach;with chair alarm set   Nurse Communication Mobility status        Time: 1315-1403 OT Time Calculation (min): 48 min  Charges: OT General Charges $OT Visit: 1 Visit OT Treatments $Self Care/Home Management : 8-22 mins $Therapeutic Activity: 8-22 mins  David [name], OTR/L Acute Rehab Services Office: [phone]   David [name] 09/12/2022, 2:25 PM

## 2022-09-13 ENCOUNTER — Inpatient Hospital Stay (HOSPITAL_COMMUNITY): Payer: Medicaid Other

## 2022-09-13 DIAGNOSIS — I10 Essential (primary) hypertension: Secondary | ICD-10-CM | POA: Diagnosis not present

## 2022-09-13 DIAGNOSIS — E162 Hypoglycemia, unspecified: Secondary | ICD-10-CM | POA: Diagnosis not present

## 2022-09-13 DIAGNOSIS — I4819 Other persistent atrial fibrillation: Secondary | ICD-10-CM | POA: Diagnosis not present

## 2022-09-13 DIAGNOSIS — E876 Hypokalemia: Secondary | ICD-10-CM | POA: Diagnosis not present

## 2022-09-13 LAB — GLUCOSE, CAPILLARY
Glucose-Capillary: 146 mg/dL — ABNORMAL HIGH (ref 70–99)
Glucose-Capillary: 191 mg/dL — ABNORMAL HIGH (ref 70–99)
Glucose-Capillary: 180 mg/dL — ABNORMAL HIGH (ref 70–99)
Glucose-Capillary: 145 mg/dL — ABNORMAL HIGH (ref 70–99)

## 2022-09-13 LAB — BASIC METABOLIC PANEL
Anion gap: 8 (ref 5–15)
BUN: 11 mg/dL (ref 6–20)
CO2: 22 mmol/L (ref 22–32)
Calcium: 7.6 mg/dL — ABNORMAL LOW (ref 8.9–10.3)
Chloride: 101 mmol/L (ref 98–111)
Creatinine, Ser: 1.06 mg/dL (ref 0.61–1.24)
GFR, Estimated: >60 mL/min (ref >60)
Glucose, Bld: 131 mg/dL — ABNORMAL HIGH (ref 70–99)
Potassium: 5.1 mmol/L (ref 3.5–5.1)
Sodium: 131 mmol/L — ABNORMAL LOW (ref 135–145)

## 2022-09-13 MED ORDER — SODIUM CHLORIDE 0.9 % IV SOLN: INTRAVENOUS | Status: DC | PRN

## 2022-09-13 NOTE — Progress Notes (Signed)
Occupational Therapy Treatment Patient Details Name: David [name] MRN: [id number] DOB: 04/13/1960 Today's Date: 09/13/2022   History of present illness Pt is a 62 y.o. male who presented 08/20/22  with hypoglycemia. 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 5/28 surgical repair: T5-11 posterior/posterolateral arthrodesis  & Transpedicular decompression with partial corpectomy, left T8 & T8-9 laminectomy, bilateral facetectomy. 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 Mod A x 2 provided for gait within Sara+ with verbal cues and manual assist to advance B LE steps in light of ataxic movements/impaired proprioception. A 3rd person required for line mgmt and Sara+ maneuvering. Plan to further address WB through BLE with Stedy during ADLs in future OT sessions as well as use of Sara+ for gait.    Recommendations for follow up therapy are one component of a multi-disciplinary discharge planning process, led by the attending physician.  Recommendations may be updated based on patient status, additional functional criteria and insurance authorization.    Assistance Recommended at Discharge Frequent or constant Supervision/Assistance  Patient can return home with the following  Two people to help with walking and/or transfers;Two people to help with bathing/dressing/bathroom   Equipment Recommendations  BSC/3in1;Wheelchair (measurements OT);Wheelchair cushion (measurements OT)    Recommendations for Other Services Rehab consult    Precautions / Restrictions  Precautions Precautions: Fall;Back Precaution Booklet Issued: No Required Braces or Orthoses: Spinal Brace Spinal Brace: Thoracolumbosacral orthotic;Applied in sitting position Restrictions Weight Bearing Restrictions: Yes RLE Weight Bearing: Weight bearing as tolerated (per ortho note 09/03/22)       Mobility Bed Mobility Overal bed mobility: Needs Assistance Bed Mobility: Rolling, Sidelying to Sit Rolling: Supervision Sidelying to sit: HOB elevated, Min assist       General bed mobility comments: Heavy use of bed rails and increased difficulty pushing up on his L elbow > hand to ascend his trunk today, needing minA R HHA to pull up to sit and gain sitting balance L EOB.    Transfers Overall transfer level: Needs assistance Equipment used: Ambulation equipment used Transfers: Sit to/from Stand Sit to Stand: Min assist (+3 safety/equipment)           General transfer comment: Huntley Dec lift utilized for transfers and gait training today. Secondary to the sara lift providing trunk support and facilitation of hip extension to stand along with bil knee block and UE support, pt did not require as much physical assist from the therapists for mobility as he was receiving it from the device. +3 for safety and equipment management though. Transfer via Lift Equipment: Nurse, mental health Overall balance assessment: Needs assistance Sitting-balance support: Bilateral upper extremity supported, Feet supported, No upper extremity supported Sitting balance-Leahy Scale: Fair Sitting balance - Comments: Able to reach off BOS to donn brace with min guard, x1 minor LOB posteriorly needing minA to recover   Standing balance support: Bilateral upper extremity supported, During functional activity Standing balance-Leahy Scale: Poor Standing balance comment: heavy reliance on UE support and external physical assistance  ADL either performed or assessed with  clinical judgement   ADL Overall ADL's : Needs assistance/impaired                 Upper Body Dressing : Minimal assistance;Sitting Upper Body Dressing Details (indicate cue type and reason): Min A for TLSO mgmt. pt tends to don overhead                   General ADL Comments: Emphasis on trial of Sara+ to advance gait and LE coordination    Extremity/Trunk Assessment Upper Extremity Assessment Upper Extremity Assessment: Overall WFL for tasks assessed   Lower Extremity Assessment Lower Extremity Assessment: Defer to PT evaluation        Vision   Vision Assessment?: No apparent visual deficits   Perception     Praxis      Cognition Arousal/Alertness: Awake/alert Behavior During Therapy: WFL for tasks assessed/performed Overall Cognitive Status: Impaired/Different from baseline Area of Impairment: Memory, Problem solving, Attention, Following commands, Awareness, Safety/judgement                   Current Attention Level: Selective Memory: Decreased short-term memory Following Commands: Follows multi-step commands with increased time Safety/Judgement: Decreased awareness of deficits, Decreased awareness of safety Awareness: Emergent, Anticipatory Problem Solving: Slow processing, Requires verbal cues General Comments: memory deficits at times but overall showing insight into deficits and needs, motivated to improve        Exercises      Shoulder Instructions       General Comments      Pertinent Vitals/ Pain       Pain Assessment Pain Assessment: Faces Faces Pain Scale: Hurts little more Pain Location: back Pain Descriptors / Indicators: Discomfort, Grimacing, Guarding Pain Intervention(s): Monitored during session, Limited activity within patient's tolerance, Premedicated before session, RN gave pain meds during session  Home Living                                          Prior Functioning/Environment               Frequency  Min 2X/week        Progress Toward Goals  OT Goals(current goals can now be found in the care plan section)  Progress towards OT goals: Progressing toward goals  Acute Rehab OT Goals Patient Stated Goal: walk out of here OT Goal Formulation: With patient Time For Goal Achievement: 09/26/22 Potential to Achieve Goals: Fair ADL Goals Pt Will Perform Grooming: Independently;standing Pt Will Perform Lower Body Bathing: with mod assist;sit to/from stand;with adaptive equipment Pt Will Perform Lower Body Dressing: with mod assist;sit to/from stand;with adaptive equipment Pt Will Transfer to Toilet: with mod assist;stand pivot transfer;bedside commode Pt Will Perform Toileting - Clothing Manipulation and hygiene: with modified independence;sit to/from stand Additional ADL Goal #1: Pt to increase standing tolerance > 3 min during functional tasks to decrease fall risk during LB ADLs  Plan Discharge plan remains appropriate    Co-evaluation    PT/OT/SLP Co-Evaluation/Treatment: Yes Reason for Co-Treatment: Complexity of the patient's impairments (multi-system involvement);For patient/therapist safety;To address functional/ADL transfers PT goals addressed during session: Mobility/safety with mobility;Balance;Proper use of DME OT goals addressed during session: ADL's and self-care;Proper use of Adaptive equipment and DME      AM-PAC OT "6 Clicks" Daily Activity     Outcome Measure   Help from  another person eating meals?: None Help from another person taking care of personal grooming?: A Little Help from another person toileting, which includes using toliet, bedpan, or urinal?: Total Help from another person bathing (including washing, rinsing, drying)?: Total Help from another person to put on and taking off regular upper body clothing?: A Lot Help from another person to put on and taking off regular lower body clothing?: A Little 6 Click Score: 14    End of  Session Equipment Utilized During Treatment: Gait belt;Back brace  OT Visit Diagnosis: Unsteadiness on feet (R26.81);Other abnormalities of gait and mobility (R26.89);Muscle weakness (generalized) (M62.81);Pain   Activity Tolerance Patient tolerated treatment well   Patient Left in chair;with call bell/phone within reach;with chair alarm set   Nurse Communication Mobility status        Time: 1610-9604 OT Time Calculation (min): 43 min  Charges: OT General Charges $OT Visit: 1 Visit OT Treatments $Therapeutic Activity: 8-22 mins  Bradd Canary, OTR/L Acute Rehab Services Office: [phone]   Lorre Munroe 09/13/2022, 2:32 PM

## 2022-09-13 NOTE — Progress Notes (Signed)
Physical Therapy Treatment Patient Details Name: David [name] MRN: [id number] DOB: 09-Jan-1961 Today's Date: 09/13/2022   History of Present Illness Pt is a 62 y.o. male who presented 08/20/22  with hypoglycemia. 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 5/28 surgical repair: T5-11 posterior/posterolateral arthrodesis  & Transpedicular decompression with partial corpectomy, left T8 & T8-9 laminectomy, bilateral facetectomy. 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 He was able to ambulate x2 bouts of ~7 ft > ~14 ft today while utilizing the sara plus machine to provide extensive support and stability to the pt along with modAx2 to lift, place, and stabilize each leg each step and +1 additional personnel to drive/manage the machine and +1 additional personnel to provide a chair follow. Pt appears to have deficits in sensation and proprioception in his lower legs impacting his feet placement when ambulating, resulting in his ataxic, scissoring gait pattern. His knees continue to buckle as well. Will continue to follow acutely.      Recommendations for follow up therapy are one component of a multi-disciplinary discharge planning process, led by the attending physician.  Recommendations may be updated based on patient status, additional functional criteria and insurance authorization.  Follow Up Recommendations  Can patient physically be transported by private vehicle: No    Assistance Recommended at Discharge Frequent or constant Supervision/Assistance  Patient can return  home with the following Assist for transportation;Two people to help with walking and/or transfers;A lot of help with bathing/dressing/bathroom;Assistance with cooking/housework;Direct supervision/assist for financial management;Direct supervision/assist for medications management   Equipment Recommendations  Rolling walker (2 wheels);Wheelchair cushion (measurements PT);Wheelchair (measurements PT);BSC/3in1 (drop arm BSC)    Recommendations for Other Services Rehab consult     Precautions / Restrictions Precautions Precautions: Fall;Back Precaution Booklet Issued: No Required Braces or Orthoses: Spinal Brace Spinal Brace: Thoracolumbosacral orthotic;Applied in sitting position Restrictions Weight Bearing Restrictions: Yes RLE Weight Bearing: Weight bearing as tolerated (per ortho note 09/03/22)     Mobility  Bed Mobility Overal bed mobility: Needs Assistance Bed Mobility: Rolling, Sidelying to Sit Rolling: Supervision Sidelying to sit: HOB elevated, Min assist       General bed mobility comments: Heavy use of bed rails and increased difficulty pushing up on his L elbow > hand to ascend his trunk today, needing minA R HHA to pull up to sit and gain sitting balance L EOB.    Transfers Overall transfer level: Needs assistance Equipment used: Ambulation equipment used Transfers: Sit to/from Stand Sit to Stand: Min assist (+3 safety/equipment)           General transfer comment: Huntley Dec lift utilized for transfers and gait training today. Secondary to the sara lift providing trunk support and facilitation of hip extension to stand along with bil knee block and UE support, pt did not require as much physical assist from the therapists for mobility as he was receiving it from the device. +3 for safety and equipment management though. Transfer via Lift Equipment: Hydrographic surveyor  Ambulation/Gait Ambulation/Gait assistance: +2 physical assistance, Mod assist (+3 to 4 safety/equipment) Gait  Distance (Feet): 14  Feet (x2 bouts of ~7 ft > ~14 ft) Assistive device:  (sara +) Gait Pattern/deviations: Step-to pattern, Decreased step length - right, Decreased step length - left, Decreased stride length, Decreased dorsiflexion - right, Decreased dorsiflexion - left, Knees buckling, Narrow base of support, Ataxic, Scissoring, Trunk flexed Gait velocity: reduced Gait velocity interpretation: <1.31 ft/sec, indicative of household ambulator   General Gait Details: Huntley Dec plus utilized for gait training today. Secondary to the sara plus providing trunk support and facilitation of hip extension to stand along with bil knee block and UE support, pt did not require as much physical assist from the therapists for mobility as he was receiving it from the device. ModAx2 utilized to lift each leg, place each foot, and provide cues to extend knees during stance phase though as pt has poor bil feet clearance, poor bil step lengths, and ataxic, scissoring steps. +3 to 4 for safety and equipment management, with +1 driving the sara plus and IV and +2 managing his legs and +1 providing chair follow   Stairs             Wheelchair Mobility    Modified Rankin (Stroke Patients Only)       Balance Overall balance assessment: Needs assistance Sitting-balance support: Bilateral upper extremity supported, Feet supported, No upper extremity supported Sitting balance-Leahy Scale: Fair Sitting balance - Comments: Able to reach off BOS to donn brace with min guard, x1 minor LOB posteriorly needing minA to recover   Standing balance support: Bilateral upper extremity supported, During functional activity Standing balance-Leahy Scale: Poor Standing balance comment: heavy reliance on UE support and external physical assistance                            Cognition Arousal/Alertness: Awake/alert Behavior During Therapy: WFL for tasks assessed/performed Overall Cognitive Status: Impaired/Different  from baseline Area of Impairment: Memory, Problem solving, Attention, Following commands, Awareness, Safety/judgement                   Current Attention Level: Selective Memory: Decreased short-term memory Following Commands: Follows multi-step commands with increased time Safety/Judgement: Decreased awareness of deficits, Decreased awareness of safety Awareness: Emergent, Anticipatory Problem Solving: Slow processing, Requires verbal cues General Comments: memory deficits at times but overall showing insight into deficits and needs, motivated to improve        Exercises      General Comments        Pertinent Vitals/Pain Pain Assessment Pain Assessment: Faces Faces Pain Scale: Hurts little more Pain Location: back Pain Descriptors / Indicators: Discomfort, Grimacing, Guarding Pain Intervention(s): Limited activity within patient's tolerance, Monitored during session, Premedicated before session, Repositioned    Home Living                          Prior Function            PT Goals (current goals can now be found in the care plan section) Acute Rehab PT Goals Patient Stated Goal: to walk again PT Goal Formulation: With patient Time For Goal Achievement: 09/14/22 Potential to Achieve Goals: Good Progress towards PT goals: Progressing toward goals    Frequency    Min 5X/week      PT Plan Current plan remains appropriate    Co-evaluation PT/OT/SLP Co-Evaluation/Treatment: Yes Reason for Co-Treatment: Complexity of the patient's impairments (multi-system involvement);For patient/therapist safety;To address functional/ADL transfers PT goals addressed during session:  Mobility/safety with mobility;Balance;Proper use of DME        AM-PAC PT "6 Clicks" Mobility   Outcome Measure  Help needed turning from your back to your side while in a flat bed without using bedrails?: A Little Help needed moving from lying on your back to sitting on the  side of a flat bed without using bedrails?: A Little Help needed moving to and from a bed to a chair (including a wheelchair)?: Total Help needed standing up from a chair using your arms (e.g., wheelchair or bedside chair)?: Total Help needed to walk in hospital room?: Total Help needed climbing 3-5 steps with a railing? : Total 6 Click Score: 10    End of Session Equipment Utilized During Treatment: Back brace;Gait belt Activity Tolerance: Patient tolerated treatment well Patient left: in chair;with call bell/phone within reach;with chair alarm set Nurse Communication: Mobility status;Need for lift equipment;Other (comment) (pt asking about pain med schedule/plan) PT Visit Diagnosis: Difficulty in walking, not elsewhere classified (R26.2);Other abnormalities of gait and mobility (R26.89);Muscle weakness (generalized) (M62.81);Pain;Unsteadiness on feet (R26.81) Pain - Right/Left: Right Pain - part of body:  (back)     Time: 1610-9604 PT Time Calculation (min) (ACUTE ONLY): 39 min  Charges:  $Gait Training: 23-37 mins                     Raymond Gurney, PT, DPT Acute Rehabilitation Services  Office: [phone]    Jewel Baize 09/13/2022, 2:26 PM

## 2022-09-13 NOTE — Plan of Care (Signed)
  Problem: Activity: Goal: Ability to return to baseline activity level will improve Outcome: Not Progressing   Problem: Cardiovascular: Goal: Ability to achieve and maintain adequate cardiovascular perfusion will improve Outcome: Progressing   Problem: Education: Goal: Knowledge of General Education information will improve Description: Including pain rating scale, medication(s)/side effects and non-pharmacologic comfort measures Outcome: Progressing   Problem: Health Behavior/Discharge Planning: Goal: Ability to manage health-related needs will improve Outcome: Progressing

## 2022-09-13 NOTE — Progress Notes (Signed)
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 Chestine Spore on 4/25 persistent A-fib on Eliquis, DM-2, seizure disorder, HTN, HLD and chronic pain return to ED with hypoglycemia to 38.  Patient was hospitalized for disseminated MRSA bacteremia from 4/9-5/17, and discharged on p.o. doxycycline.  Patient was hypoglycemic to 38 when EMS arrived.  His hypoglycemia persisted despite intervention in ED and leading to his rehospitalization.  He also have significant back pain.  MRI thoracic spine concerning for progressive discitis/osteomyelitis at T7-T9, extensive phlegmonous change in the paravertebral soft tissues,  partial collapse of T8 vertebral body with 40% vertebral body height loss and approximately 6 mm bony retropulsion, moderate canal stenosis and mild cord edema.  Neurosurgery and ID consulted.   Patient underwent surgical decompression by  neurosurgery. Palliative medicine helping with pain management.  Therapy recommended CIR, unfortunately, pt does not have caregiver support, hence SNF is now recommended. TOC  on board and assisting with disposition.    Patient remains confused despite adjusting pain meds. His sodium has improved. CT head and MRI brain did not reveal anything acute. Neuro exam is grossly normal except for his mental status. No source of infection.  Adjusting pain meds.    Assessment & Plan:   Principal Problem:   Hypoglycemia Active Problems:   Essential hypertension   Insulin dependent type 2 diabetes mellitus (HCC)   Persistent atrial fibrillation (HCC)   Pure hypercholesterolemia   Cellulitis in diabetic foot (HCC)   Diskitis   Hypokalemia   Hypotension    Constipation   Acute metabolic encephalopathy   Acute postoperative anemia due to expected blood loss  #1 disseminated MRSA bacteremia with T7-T9 discitis and osteomyelitis -MRI done of patient's back was concerning for progressive discitis/osteomyelitis. -Patient noted to have recent hospitalization from 4/9-5/17 and noted to have been discharged on doxycycline. -Patient noted to have previously been treated with daptomycin followed by vancomycin and oritavancin. -Patient seen by ID and neurosurgery during this hospitalization. -Patient underwent surgical intervention 5/29 per neurosurgery, JP drain subsequently removed. -ID following and recommended daptomycin to start 5/29 and oral doxycycline to start on 6/26/ 2024. -Palliative care following and helping manage patient's pain. -Continue Robaxin 750 mg every 6 hours as needed, Toradol 50 mg IV every 8 hours as needed, Lyrica 75 mg nightly, Lidoderm patch, celecoxib 100 mg twice daily, BuSpar 15 mg twice daily, fentanyl patch 50 mcg every 72 hours, oxycodone 10 mg every 4 hours as needed. -Continue bowel regimen.  2.  Right foot osteomyelitis status post right fifth ray amputation 4/30 -Continue current antibiotic regimen. -WBAT. -Will need outpatient follow-up with orthopedics.  3.  Chronic systolic CHF -Lasix on hold secondary to hyponatremia.   -Patient seems to be auto diuresing. -Patient with urine output of 1.750 L over the past 24 hours.   -Monitor volume status with hydration.    4.  Hypomagnesemia/hypokalemia -Repleted.   5.  Hypertension -Lasix held secondary to hyponatremia.   -Pain likely playing a role with elevated blood pressure.   -Coreg.  6.  Acute metabolic encephalopathy -Felt secondary to pain meds versus hyponatremia versus withdrawal. -It is noted that patient did have some intermittent hallucinations. -Ammonia level at 38. -  CT head without any acute abnormalities. -MRI brain negative for acute  abnormalities. -Clinical improvement and likely at baseline.   7.  Persistent A-fib -Coreg for rate control. -Eliquis initially held and resumed 1 week postop per neurosurgery recommendations on 09/08/2022.   8.  Moderate pleural effusion -Stable. -Patient seems to be auto diuresing. -Lasix on hold.    9.  Constipation -Improved on current bowel regimen. -Patient received sorbitol x 2 doses on 09/10/2022.  10.  Hypotension -Resolved.  11.  Postop acute blood loss anemia -Likely postoperatively. -s/p posttransfusion 1 unit PRBCs hemoglobin currently stable at 8.7. -Follow H&H. -Transfusion threshold hemoglobin < 7.  12.  AKI -Felt likely secondary to prerenal azotemia secondary to hypotension and dehydration. -Patient with urine output of 1.750 L over the past 24 hours. -Resolved.  13.  Hyponatremia -??  Etiology. -Different show includes secondary to volume overload versus poor oral intake. -SIADH ruled out. -Urine sodium less than 10, TSH within normal limits, serum osmolality 287, serum cortisol is 12. -Sodium levels improving with holding Lasix and placing on IV fluids. -Urine output of 1.750 L over the past 24 hours. -Continue to hold Lasix.   -Saline lock IV fluids. -Patient with some complaints of shortness of breath with oxycodone, chest x-ray pending, if concern for volume overload will need to resume Lasix. -Repeat labs in the AM.  14.  Diabetes mellitus type 2 -Hemoglobin A1c 9.1 (07/13/2022). -CBG 146 this morning. -Continue Semglee 20 units daily, SSI.    DVT prophylaxis: Eliquis Code Status: Full Family Communication: Updated patient.  No family at bedside. Disposition: SNF when bed available.  Status is: Inpatient Remains inpatient appropriate because: Severity of illness   Consultants:  ID: Dr. Thedore Mins 08/23/2022 Wound care RN, Ria Bush 08/24/2022 Palliative care: Richrd Humbles 08/26/2022  Procedures:  CT angiogram chest 08/22/2022 CT chest  09/05/2022 CT head 09/03/2022 Chest x-ray 08/20/2022, 09/01/2022, 09/13/2022 PICC line placement 09/01/2022 Plan films of the T-spine 08/30/2022 MRI of the T-spine 08/22/2022 MRI of the brain 09/05/2022 T5-6, T6-7, T7-8, T8-9, T9-10, T10-11 posterior/posterior lateral arthrodesis/transpedicular decompression with partial corpectomy, left T8/T8-9 laminectomy, bilateral facetectomy/segmental instrumentation with percutaneously placed pedicle screw and rod construct at T5 to 6-7 to 8-9 to 10-11/Harvest of local autograft/use of morselized allograft/intraoperative neuronavigation with [facility]/intraoperative CT-Per neurosurgery: Dr. Maisie Fus 08/30/2022  Antimicrobials:  Anti-infectives (From admission, onward)    Start     Dose/Rate Route Frequency Ordered Stop   09/28/22 1000  doxycycline (VIBRA-TABS) tablet 100 mg        100 mg Oral Every 12 hours 09/01/22 1113     08/31/22 2000  DAPTOmycin (CUBICIN) 650 mg in sodium chloride 0.9 % IVPB        8 mg/kg  83.3 kg 126 mL/hr over 30 Minutes Intravenous Daily 08/31/22 1053 09/27/22 2359   08/30/22 2300  vancomycin (VANCOREADY) IVPB 750 mg/150 mL  Status:  Discontinued        750 mg 150 mL/hr over 60 Minutes Intravenous Every 12 hours 08/30/22 1150 08/31/22 1053   08/30/22 1712  vancomycin (VANCOCIN) powder  Status:  Discontinued          As needed 08/30/22 1714 08/30/22 1756   08/30/22 1142  ceFAZolin (ANCEF) 2-4 GM/100ML-% IVPB  Status:  Discontinued       Note to Pharmacy: [facility], [place]: cabinet override      08/30/22 1142 08/30/22 1300   08/30/22 0600  ceFAZolin (ANCEF) IVPB 2g/100 mL premix        2  g 200 mL/hr over 30 Minutes Intravenous On call to O.R. 08/29/22 1848 08/30/22 1401   08/27/22 1041  vancomycin (VANCOCIN) IVPB 1000 mg/200 mL premix  Status:  Discontinued        1,000 mg 200 mL/hr over 60 Minutes Intravenous Every 12 hours 08/27/22 0802 08/30/22 1150   08/26/22 2300  vancomycin (VANCOREADY) IVPB 1250 mg/250 mL  Status:  Discontinued         1,250 mg 166.7 mL/hr over 90 Minutes Intravenous Every 24 hours 08/26/22 1014 08/27/22 0802   08/23/22 2200  vancomycin (VANCOREADY) IVPB 750 mg/150 mL  Status:  Discontinued        750 mg 150 mL/hr over 60 Minutes Intravenous Every 12 hours 08/23/22 0938 08/26/22 1014   08/23/22 1030  vancomycin (VANCOREADY) IVPB 1500 mg/300 mL        1,500 mg 150 mL/hr over 120 Minutes Intravenous  Once 08/23/22 0938 08/23/22 1312   08/20/22 2200  doxycycline (VIBRA-TABS) tablet 100 mg  Status:  Discontinued        100 mg Oral 2 times daily 08/20/22 1737 08/23/22 0927         Subjective: Laying in bed.  States worked with physical therapy and they wore him out yesterday.  No chest pain.  No significant ongoing shortness of breath however states feels a little short of breath with oxycodone.  No abdominal pain.  Tolerating current diet.    Objective: Vitals:   09/12/22 2101 09/12/22 2334 09/13/22 0502 09/13/22 0937  BP: (!) 145/110 (!) 132/91 (!) 144/95 133/84  Pulse: 64 63 (!) 49 96  Resp: 16 18 16 19   Temp: 97.6 F (36.4 C) 97.8 F (36.6 C) 97.7 F (36.5 C) 97.6 F (36.4 C)  TempSrc: Oral Oral Oral Axillary  SpO2: 94% 92% 94% 92%  Weight:      Height:        Intake/Output Summary (Last 24 hours) at 09/13/2022 1113 Last data filed at 09/12/2022 2142 Gross per 24 hour  Intake 1483.61 ml  Output 1750 ml  Net -266.39 ml    Filed Weights   08/20/22 1821 08/23/22 2307  Weight: 77.8 kg 83.3 kg    Examination:  General exam: NAD. Honeycomb dressing on back c/d/i Respiratory system: CTAB.  No wheezes, no crackles, no rhonchi.  Fair air movement.  Speaking in full sentences.  Cardiovascular system: RRR no murmurs rubs or gallops.  No JVD.  No lower extremity edema.  Gastrointestinal system:.  Abdomen soft, nontender, nondistended, positive bowel sounds.  No rebound.  No guarding.  No guarding. Central nervous system: Alert and oriented.  No focal neurological deficits.   Extremities:  Symmetric 5 x 5 power. Skin: No rashes, lesions or ulcers Psychiatry: Judgement and insight appear normal. Mood & affect appropriate.     Data Reviewed: I have personally reviewed following labs and imaging studies  CBC: Recent Labs  Lab 09/07/22 0530 09/08/22 0410 09/09/22 0510 09/10/22 0430  WBC 8.2 9.5 8.6 9.1  NEUTROABS 5.5  --   --   --   HGB 8.1* 8.8* 8.9* 8.7*  HCT 25.8* 27.3* 27.3* 27.3*  MCV 89.3 88.6 90.1 89.8  PLT 304 316 304 273     Basic Metabolic Panel: Recent Labs  Lab 09/08/22 0410 09/09/22 0510 09/10/22 0430 09/11/22 0600 09/12/22 0503 09/13/22 0434  NA 126* 130* 130* 128* 130* 131*  K 4.6 4.4 4.5 4.3 4.8 5.1  CL 94* 99 97* 98 98 101  CO2 23 23  23 23 23 22   GLUCOSE 165* 136* 88 97 153* 131*  BUN 17 16 16 12 12 11   CREATININE 1.13 1.23 1.20 1.15 1.21 1.06  CALCIUM 8.9 8.9 8.8* 8.8* 8.6* 7.6*  MG 1.8 1.8  --  1.9 1.9  --      GFR: Estimated Creatinine Clearance: 81.3 mL/min (by C-G formula based on SCr of 1.06 mg/dL).  Liver Function Tests: No results for input(s): "AST", "ALT", "ALKPHOS", "BILITOT", "PROT", "ALBUMIN" in the last 168 hours.  CBG: Recent Labs  Lab 09/12/22 0620 09/12/22 1159 09/12/22 1835 09/12/22 2100 09/13/22 0623  GLUCAP 135* 141* 156* 178* 146*      No results found for this or any previous visit (from the past 240 hour(s)).        Radiology Studies: No results found.      Scheduled Meds:  sodium chloride   Intravenous Once   acetaminophen  1,000 mg Oral TID   alteplase  2 mg Intracatheter Once   apixaban  5 mg Oral BID   atorvastatin  80 mg Oral Daily   busPIRone  15 mg Oral BID   carvedilol  3.125 mg Oral BID   celecoxib  100 mg Oral BID   Chlorhexidine Gluconate Cloth  6 each Topical Daily   digoxin  125 mcg Oral Daily   [START ON 09/28/2022] doxycycline  100 mg Oral Q12H   DULoxetine  20 mg Oral Daily   famotidine  20 mg Oral BID   feeding supplement  237 mL Oral BID BM   fentaNYL  1  patch Transdermal Q48H   fluticasone  1 spray Each Nare Daily   insulin aspart  0-5 Units Subcutaneous QHS   insulin aspart  0-9 Units Subcutaneous TID WC   insulin glargine-yfgn  20 Units Subcutaneous Daily   lidocaine  1 patch Transdermal Q24H   magnesium oxide  400 mg Oral BID   pantoprazole  40 mg Oral Daily   polyethylene glycol  17 g Oral BID   pregabalin  75 mg Oral QHS   senna-docusate  1 tablet Oral BID   sodium chloride flush  10-40 mL Intracatheter Q12H   sodium chloride flush  3 mL Intravenous Q12H   Continuous Infusions:  sodium chloride Stopped (09/01/22 1621)   sodium chloride 125 mL/hr at 09/13/22 0735   DAPTOmycin (CUBICIN) 650 mg in sodium chloride 0.9 % IVPB 650 mg (09/12/22 1530)     LOS: 21 days    Time spent: 35 minutes    Ramiro Harvest, MD [facility]   To contact the attending provider between 7A-7P or the covering provider during after hours 7P-7A, please log into the web site [email or web address] and access using universal [place] password for that web site. If you do not have the password, please call the hospital operator.  09/13/2022, 11:13 AM

## 2022-09-14 ENCOUNTER — Inpatient Hospital Stay (HOSPITAL_COMMUNITY): Payer: Medicaid Other

## 2022-09-14 DIAGNOSIS — M4644 Discitis, unspecified, thoracic region: Secondary | ICD-10-CM | POA: Diagnosis not present

## 2022-09-14 DIAGNOSIS — G9341 Metabolic encephalopathy: Secondary | ICD-10-CM | POA: Diagnosis not present

## 2022-09-14 DIAGNOSIS — E162 Hypoglycemia, unspecified: Secondary | ICD-10-CM | POA: Diagnosis not present

## 2022-09-14 LAB — CBC
HCT: 29.1 % — ABNORMAL LOW (ref 39.0–52.0)
Hemoglobin: 9.3 g/dL — ABNORMAL LOW (ref 13.0–17.0)
MCH: 29.7 pg (ref 26.0–34.0)
MCHC: 32 g/dL (ref 30.0–36.0)
MCV: 93 fL (ref 80.0–100.0)
Platelets: 260 10*3/uL (ref 150–400)
RBC: 3.13 MIL/uL — ABNORMAL LOW (ref 4.22–5.81)
RDW: 18.6 % — ABNORMAL HIGH (ref 11.5–15.5)
WBC: 10.1 10*3/uL (ref 4.0–10.5)
nRBC: 0 % (ref 0.0–0.2)

## 2022-09-14 LAB — GLUCOSE, CAPILLARY
Glucose-Capillary: 93 mg/dL (ref 70–99)
Glucose-Capillary: 118 mg/dL — ABNORMAL HIGH (ref 70–99)
Glucose-Capillary: 117 mg/dL — ABNORMAL HIGH (ref 70–99)
Glucose-Capillary: 224 mg/dL — ABNORMAL HIGH (ref 70–99)

## 2022-09-14 LAB — MAGNESIUM: Magnesium: 1.8 mg/dL (ref 1.7–2.4)

## 2022-09-14 LAB — BASIC METABOLIC PANEL
Anion gap: 7 (ref 5–15)
BUN: 10 mg/dL (ref 6–20)
CO2: 20 mmol/L — ABNORMAL LOW (ref 22–32)
Calcium: 8.7 mg/dL — ABNORMAL LOW (ref 8.9–10.3)
Chloride: 103 mmol/L (ref 98–111)
Creatinine, Ser: 1.08 mg/dL (ref 0.61–1.24)
GFR, Estimated: >60 mL/min (ref >60)
Glucose, Bld: 111 mg/dL — ABNORMAL HIGH (ref 70–99)
Potassium: 4.3 mmol/L (ref 3.5–5.1)
Sodium: 130 mmol/L — ABNORMAL LOW (ref 135–145)

## 2022-09-14 MED ORDER — IOHEXOL 9 MG/ML PO SOLN
1000.0000 mL | ORAL | Status: AC
  Administered 2022-09-14: 1000 mL via ORAL

## 2022-09-14 MED ORDER — IOHEXOL 9 MG/ML PO SOLN: 500.0000 mL | ORAL | Status: DC

## 2022-09-14 MED ORDER — IOHEXOL 350 MG/ML SOLN
75.0000 mL | Freq: Once | INTRAVENOUS | Status: AC | PRN
  Administered 2022-09-14: 75 mL via INTRAVENOUS

## 2022-09-14 NOTE — Progress Notes (Signed)
Progress Note    David [name]  ZOX:[id number] DOB: [birth year]  DOA: 08/20/2022 PCP: [name], MD      Brief Narrative:    Medical records reviewed and are as summarized below:  David [name] is a 62 y.o. 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 Chestine Spore on 4/25 persistent A-fib on Eliquis, DM-2, seizure disorder, HTN, HLD and chronic pain return to ED with hypoglycemia to 38. Patient was hospitalized for disseminated MRSA bacteremia from 4/9-5/17, and discharged on p.o. doxycycline. Patient was hypoglycemic to 38 when EMS arrived. His hypoglycemia persisted despite intervention in ED and leading to his rehospitalization. He also have significant back pain. MRI thoracic spine concerning for progressive discitis/osteomyelitis at T7-T9, extensive phlegmonous change in the paravertebral soft tissues, partial collapse of T8 vertebral body with 40% vertebral body height loss and approximately 6 mm bony retropulsion, moderate canal stenosis and mild cord edema. Neurosurgery and ID consulted. Patient underwent surgical decompression by neurosurgery. Palliative medicine helping with pain management. Therapy recommended CIR, unfortunately, pt does not have caregiver support, hence SNF is now recommended. TOC on board and assisting with disposition             Assessment/Plan:   Principal Problem:   Hypoglycemia Active Problems:   Essential hypertension   Insulin dependent type 2 diabetes mellitus (HCC)   Persistent atrial fibrillation (HCC)   Pure hypercholesterolemia   Cellulitis in diabetic foot (HCC)   Diskitis   Hypokalemia   Hypotension   Constipation   Acute metabolic encephalopathy   Acute postoperative anemia due to expected blood loss    Body mass index is 24.91 kg/m.   1 disseminated MRSA bacteremia with T7-T9 discitis and osteomyelitis -MRI done of  patient's back was concerning for progressive discitis/osteomyelitis. -Patient noted to have recent hospitalization from 4/9-5/17 and noted to have been discharged on doxycycline. -Patient noted to have previously been treated with daptomycin followed by vancomycin and oritavancin. -Patient seen by ID and neurosurgery during this hospitalization. -Patient underwent surgical intervention 5/29 per neurosurgery, JP drain subsequently removed. -ID following and recommended daptomycin to start 5/29 and oral doxycycline to start on 6/26/ 2024. -Palliative care following and helping manage patient's pain. -Continue Robaxin 750 mg every 6 hours as needed, Toradol 50 mg IV every 8 hours as needed, Lyrica 75 mg nightly, Lidoderm patch, celecoxib 100 mg twice daily, BuSpar 15 mg twice daily, fentanyl patch 50 mcg every 72 hours, oxycodone 10 mg every 4 hours as needed. -Continue bowel regimen.   2.  Right foot osteomyelitis status post right fifth ray amputation 4/30 -Continue current antibiotic regimen. -WBAT. -Will need outpatient follow-up with orthopedics.   3.  Chronic systolic CHF -Compensated. Lasix on hold secondary to hyponatremia.      4.  Hypomagnesemia/hypokalemia -Repleted.    5.  Hypertension -Continue Coreg.   -Lasix on hold secondary to hyponatremia.      6.  Acute metabolic encephalopathy -Felt secondary to pain meds versus hyponatremia versus withdrawal. -It is noted that patient did have some intermittent hallucinations. -Ammonia level at 38. -CT head without any acute abnormalities. -MRI brain negative for acute abnormalities. -Mental status has imoproved     7.  Persistent A-fib -Coreg for rate control.  -Eliquis  was held for 1 week and resumed 09/08/2022.   8.  Moderate pleural effusion -Stable. -Continue to hold Lasix.    9.  Constipation -Improved .  Continue laxatives as needed.   10.  Hypotension -Resolved.   11.  Postop acute blood loss anemia -Likely  loss postoperatively. -Started posttransfusion 1 unit PRBCs hemoglobin currently stable at 8.7. -Follow H&H. -Transfusion threshold hemoglobin < 7.   12.  AKI -Felt likely secondary to prerenal azotemia secondary to hypotension and dehydration. -Resolved   13.  Hyponatremia -??  Etiology. -Different show includes secondary to volume overload versus poor oral intake. -Urine sodium less than 10, TSH within normal limits, serum osmolality 287, serum cortisol is 12. -Sodium levels better after holding Lasix.  Receiving treatment with IV fluids.   14.  Diabetes mellitus type 2 -Hemoglobin A1c 9.1 (07/13/2022). -Continue Semglee 20 units daily. -Continue SSI.   Left-sided abdominal pain with swelling CT abdomen pelvis with contrast has been ordered for further evaluation.  Diet Order             Diet Carb Modified Fluid consistency: Thin; Room service appropriate? Yes  Diet effective now                            Consultants: ID: Dr. Thedore Mins 08/23/2022 Wound care RN, Ria Bush 08/24/2022 Palliative care: Richrd Humbles 08/26/2022  Procedures: CT angiogram chest 08/22/2022 CT chest 09/05/2022 CT head 09/03/2022 Chest x-ray 08/20/2022, 09/01/2022, 09/13/2022 PICC line placement 09/01/2022 Plan films of the T-spine 08/30/2022 MRI of the T-spine 08/22/2022 MRI of the brain 09/05/2022 T5-6, T6-7, T7-8, T8-9, T9-10, T10-11 posterior/posterior lateral arthrodesis/transpedicular decompression with partial corpectomy, left T8/T8-9 laminectomy, bilateral facetectomy/segmental instrumentation with percutaneously placed pedicle screw and rod construct at T5 to 6-7 to 8-9 to 10-11/Harvest of local autograft/use of morselized allograft/intraoperative neuronavigation with Ste[facility]/intraoperative CT-Per neurosurgery: Dr. Maisie Fus 08/30/2022    Medications:    sodium chloride   Intravenous Once   acetaminophen  1,000 mg Oral TID   alteplase  2 mg Intracatheter Once   apixaban  5 mg Oral BID    atorvastatin  80 mg Oral Daily   busPIRone  15 mg Oral BID   carvedilol  3.125 mg Oral BID   celecoxib  100 mg Oral BID   Chlorhexidine Gluconate Cloth  6 each Topical Daily   digoxin  125 mcg Oral Daily   [START ON 09/28/2022] doxycycline  100 mg Oral Q12H   DULoxetine  20 mg Oral Daily   famotidine  20 mg Oral BID   feeding supplement  237 mL Oral BID BM   fentaNYL  1 patch Transdermal Q48H   fluticasone  1 spray Each Nare Daily   insulin aspart  0-5 Units Subcutaneous QHS   insulin aspart  0-9 Units Subcutaneous TID WC   insulin glargine-yfgn  20 Units Subcutaneous Daily   iohexol  1,000 mL Oral Q1H   lidocaine  1 patch Transdermal Q24H   magnesium oxide  400 mg Oral BID   pantoprazole  40 mg Oral Daily   polyethylene glycol  17 g Oral BID   pregabalin  75 mg Oral QHS   senna-docusate  1 tablet Oral BID   sodium chloride flush  10-40 mL Intracatheter Q12H   sodium chloride flush  3 mL Intravenous Q12H   Continuous Infusions:  sodium chloride Stopped (09/01/22 1621)   sodium chloride 5 mL/hr at 09/13/22 1125   DAPTOmycin (CUBICIN) 650 mg in sodium chloride 0.9 % IVPB 650 mg (09/14/22 1304)     Anti-infectives (From admission, onward)    Start  Dose/Rate Route Frequency Ordered Stop   09/28/22 1000  doxycycline (VIBRA-TABS) tablet 100 mg        100 mg Oral Every 12 hours 09/01/22 1113     08/31/22 2000  DAPTOmycin (CUBICIN) 650 mg in sodium chloride 0.9 % IVPB        8 mg/kg  83.3 kg 126 mL/hr over 30 Minutes Intravenous Daily 08/31/22 1053 09/27/22 2359   08/30/22 2300  vancomycin (VANCOREADY) IVPB 750 mg/150 mL  Status:  Discontinued        750 mg 150 mL/hr over 60 Minutes Intravenous Every 12 hours 08/30/22 1150 08/31/22 1053   08/30/22 1712  vancomycin (VANCOCIN) powder  Status:  Discontinued          As needed 08/30/22 1714 08/30/22 1756   08/30/22 1142  ceFAZolin (ANCEF) 2-4 GM/100ML-% IVPB  Status:  Discontinued       Note to Pharmacy: [facility], [place]:  cabinet override      08/30/22 1142 08/30/22 1300   08/30/22 0600  ceFAZolin (ANCEF) IVPB 2g/100 mL premix        2 g 200 mL/hr over 30 Minutes Intravenous On call to O.R. 08/29/22 1848 08/30/22 1401   08/27/22 1041  vancomycin (VANCOCIN) IVPB 1000 mg/200 mL premix  Status:  Discontinued        1,000 mg 200 mL/hr over 60 Minutes Intravenous Every 12 hours 08/27/22 0802 08/30/22 1150   08/26/22 2300  vancomycin (VANCOREADY) IVPB 1250 mg/250 mL  Status:  Discontinued        1,250 mg 166.7 mL/hr over 90 Minutes Intravenous Every 24 hours 08/26/22 1014 08/27/22 0802   08/23/22 2200  vancomycin (VANCOREADY) IVPB 750 mg/150 mL  Status:  Discontinued        750 mg 150 mL/hr over 60 Minutes Intravenous Every 12 hours 08/23/22 0938 08/26/22 1014   08/23/22 1030  vancomycin (VANCOREADY) IVPB 1500 mg/300 mL        1,500 mg 150 mL/hr over 120 Minutes Intravenous  Once 08/23/22 0938 08/23/22 1312   08/20/22 2200  doxycycline (VIBRA-TABS) tablet 100 mg  Status:  Discontinued        100 mg Oral 2 times daily 08/20/22 1737 08/23/22 1610              Family Communication/Anticipated D/C date and plan/Code Status   DVT prophylaxis: SCD's Start: 08/30/22 1939 apixaban (ELIQUIS) tablet 5 mg     Code Status: Full Code  Family Communication: Plan was discussed with his wife at the bedside Disposition Plan: Plan to discharge to SNF   Status is: Inpatient Remains inpatient appropriate because: Awaiting placement to SNF       Subjective:   Interval events noted.  He complains of pain and swelling in the left side of his abdomen.  His wife was at the bedside.  Objective:    Vitals:   09/13/22 1732 09/13/22 1949 09/14/22 0020 09/14/22 0344  BP: 122/87 (!) 144/88 128/79 (!) 157/87  Pulse: (!) 56 76 (!) 53 (!) 54  Resp: 18 18 16 18   Temp: (!) 97.5 F (36.4 C) 97.8 F (36.6 C) 97.7 F (36.5 C) 98 F (36.7 C)  TempSrc: Oral Oral    SpO2: 95% 95% 95% 95%  Weight:      Height:        No data found.   Intake/Output Summary (Last 24 hours) at 09/14/2022 1404 Last data filed at 09/14/2022 0644 Gross per 24 hour  Intake --  Output 2800  ml  Net -2800 ml   Filed Weights   08/20/22 1821 08/23/22 2307  Weight: 77.8 kg 83.3 kg    Exam:  GEN: NAD SKIN: Warm and dry EYES: EOMI ENT: MMM CV: RRR PULM: CTA B ABD: soft, swelling mainly on the left side of the abdomen, LLQ tenderness, no rebound tenderness or guarding, +BS CNS: AAO x 3, non focal EXT: No edema or tenderness GU: Foley catheter draining amber urine       Data Reviewed:   I have personally reviewed following labs and imaging studies:  Labs: Labs show the following:   Basic Metabolic Panel: Recent Labs  Lab 09/08/22 0410 09/09/22 0510 09/10/22 0430 09/11/22 0600 09/12/22 0503 09/13/22 0434 09/14/22 0330  NA 126* 130* 130* 128* 130* 131* 130*  K 4.6 4.4 4.5 4.3 4.8 5.1 4.3  CL 94* 99 97* 98 98 101 103  CO2 23 23 23 23 23 22  20*  GLUCOSE 165* 136* 88 97 153* 131* 111*  BUN 17 16 16 12 12 11 10   CREATININE 1.13 1.23 1.20 1.15 1.21 1.06 1.08  CALCIUM 8.9 8.9 8.8* 8.8* 8.6* 7.6* 8.7*  MG 1.8 1.8  --  1.9 1.9  --  1.8   GFR Estimated Creatinine Clearance: 79.8 mL/min (by C-G formula based on SCr of 1.08 mg/dL). Liver Function Tests: No results for input(s): "AST", "ALT", "ALKPHOS", "BILITOT", "PROT", "ALBUMIN" in the last 168 hours. No results for input(s): "LIPASE", "AMYLASE" in the last 168 hours. No results for input(s): "AMMONIA" in the last 168 hours. Coagulation profile No results for input(s): "INR", "PROTIME" in the last 168 hours.  CBC: Recent Labs  Lab 09/08/22 0410 09/09/22 0510 09/10/22 0430 09/14/22 0330  WBC 9.5 8.6 9.1 10.1  HGB 8.8* 8.9* 8.7* 9.3*  HCT 27.3* 27.3* 27.3* 29.1*  MCV 88.6 90.1 89.8 93.0  PLT 316 304 273 260   Cardiac Enzymes: Recent Labs  Lab 09/08/22 0410  CKTOTAL 28*   BNP (last 3 results) No results for input(s): "PROBNP" in the last  8760 hours. CBG: Recent Labs  Lab 09/13/22 1240 09/13/22 1732 09/13/22 2117 09/14/22 0639 09/14/22 1248  GLUCAP 191* 180* 145* 93 118*   D-Dimer: No results for input(s): "DDIMER" in the last 72 hours. Hgb A1c: No results for input(s): "HGBA1C" in the last 72 hours. Lipid Profile: No results for input(s): "CHOL", "HDL", "LDLCALC", "TRIG", "CHOLHDL", "LDLDIRECT" in the last 72 hours. Thyroid function studies: No results for input(s): "TSH", "T4TOTAL", "T3FREE", "THYROIDAB" in the last 72 hours.  Invalid input(s): "FREET3" Anemia work up: No results for input(s): "VITAMINB12", "FOLATE", "FERRITIN", "TIBC", "IRON", "RETICCTPCT" in the last 72 hours. Sepsis Labs: Recent Labs  Lab 09/08/22 0410 09/09/22 0510 09/10/22 0430 09/14/22 0330  WBC 9.5 8.6 9.1 10.1    Microbiology No results found for this or any previous visit (from the past 240 hour(s)).  Procedures and diagnostic studies:  DG CHEST PORT 1 VIEW  Result Date: 09/13/2022 CLINICAL DATA:  PICC line placement EXAM: PORTABLE CHEST 1 VIEW COMPARISON:  09/01/2022 FINDINGS: A right PICC catheter has been placed. Tip is partially obscured by orthopedic hardware but appears to project over the mid to upper SVC region. No pneumothorax. Cardiac enlargement. Bilateral pleural effusions with perihilar infiltrates, progressing since prior study. IMPRESSION: 1. Right PICC line placed to the level of the upper to mid SVC. 2. Cardiac enlargement with perihilar infiltrates and bilateral pleural effusions. Electronically Signed   By: Burman Nieves M.D.   On: 09/13/2022  22:35               LOS: 22 days   Willy Vorce  [employer] loss adjuster on [email or web address]. If 7PM-7AM, please contact night-coverage at [email or web address]     09/14/2022, 2:04 PM

## 2022-09-14 NOTE — Plan of Care (Signed)
  Problem: Cardiovascular: Goal: Ability to achieve and maintain adequate cardiovascular perfusion will improve Outcome: Progressing   Problem: Education: Goal: Knowledge of General Education information will improve Description: Including pain rating scale, medication(s)/side effects and non-pharmacologic comfort measures Outcome: Progressing   

## 2022-09-14 NOTE — Progress Notes (Signed)
PT Cancellation Note  Patient Details Name: David [name] MRN: [id number] DOB: 1961/01/26   Cancelled Treatment:    Reason Eval/Treat Not Completed: Other (comment) 9:59AM Attempted to see pt for PT session, visitor in room. Pt reports he just woke up & is awaiting pain meds. Pt notes he is motivated to participate but needs to be premedicated & is fatigued after yesterday's session. Will f/u as able.  Aleda Grana, PT, DPT 09/14/22, 10:07 AM   Sandi Mariscal 09/14/2022, 10:06 AM

## 2022-09-15 ENCOUNTER — Encounter (HOSPITAL_COMMUNITY): Payer: Self-pay | Admitting: Internal Medicine

## 2022-09-15 DIAGNOSIS — M4644 Discitis, unspecified, thoracic region: Secondary | ICD-10-CM | POA: Diagnosis not present

## 2022-09-15 LAB — CK: Total CK: 25 U/L — ABNORMAL LOW (ref 49–397)

## 2022-09-15 LAB — GLUCOSE, CAPILLARY
Glucose-Capillary: 120 mg/dL — ABNORMAL HIGH (ref 70–99)
Glucose-Capillary: 174 mg/dL — ABNORMAL HIGH (ref 70–99)
Glucose-Capillary: 151 mg/dL — ABNORMAL HIGH (ref 70–99)
Glucose-Capillary: 136 mg/dL — ABNORMAL HIGH (ref 70–99)

## 2022-09-15 MED ORDER — FUROSEMIDE 20 MG PO TABS
20.0000 mg | ORAL_TABLET | Freq: Every day | ORAL | Status: DC
  Administered 2022-09-15 – 2022-10-28 (×44): 20 mg via ORAL
  Filled 2022-09-15 (×44): qty 1

## 2022-09-15 MED ORDER — SALINE SPRAY 0.65 % NA SOLN
1.0000 | NASAL | Status: DC | PRN
  Administered 2022-09-26: 1 via NASAL
  Filled 2022-09-15: qty 44

## 2022-09-15 NOTE — Progress Notes (Addendum)
Progress Note    David [name]  ZOX:[id number] DOB: 04-25-1960  DOA: 08/20/2022 PCP: [name], MD      Brief Narrative:    Medical records reviewed and are as summarized below:  David [name] is a 62 y.o. 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 Chestine Spore on 4/25 persistent A-fib on Eliquis, DM-2, seizure disorder, HTN, HLD and chronic pain return to ED with hypoglycemia to 38. Patient was hospitalized for disseminated MRSA bacteremia from 4/9-5/17, and discharged on p.o. doxycycline. Patient was hypoglycemic to 38 when EMS arrived. His hypoglycemia persisted despite intervention in ED and leading to his rehospitalization. He also have significant back pain. MRI thoracic spine concerning for progressive discitis/osteomyelitis at T7-T9, extensive phlegmonous change in the paravertebral soft tissues, partial collapse of T8 vertebral body with 40% vertebral body height loss and approximately 6 mm bony retropulsion, moderate canal stenosis and mild cord edema. Neurosurgery and ID consulted. Patient underwent surgical decompression by neurosurgery. Palliative medicine helping with pain management. Therapy recommended CIR, unfortunately, pt does not have caregiver support, hence SNF is now recommended. TOC on board and assisting with disposition             Assessment/Plan:   Principal Problem:   Hypoglycemia Active Problems:   Essential hypertension   Insulin dependent type 2 diabetes mellitus (HCC)   Persistent atrial fibrillation (HCC)   Pure hypercholesterolemia   Cellulitis in diabetic foot (HCC)   Diskitis   Hypokalemia   Hypotension   Constipation   Acute metabolic encephalopathy   Acute postoperative anemia due to expected blood loss    Body mass index is 24.91 kg/m.   1 disseminated MRSA bacteremia with T7-T9 discitis and osteomyelitis -MRI done of  patient's back was concerning for progressive discitis/osteomyelitis. -Patient noted to have recent hospitalization from 4/9-5/17 and noted to have been discharged on doxycycline. -Patient noted to have previously been treated with daptomycin followed by vancomycin and oritavancin. -Patient seen by ID and neurosurgery during this hospitalization. -Patient underwent surgical intervention 5/29 per neurosurgery, JP drain subsequently removed. -ID following and recommended daptomycin to start 5/29 and oral doxycycline to start on 6/26/ 2024. -Palliative care following and helping manage patient's pain. -Continue Robaxin 750 mg every 6 hours as needed, Toradol 50 mg IV every 8 hours as needed, Lyrica 75 mg nightly, Lidoderm patch, celecoxib 100 mg twice daily, BuSpar 15 mg twice daily, fentanyl patch 50 mcg every 72 hours, oxycodone 10 mg every 4 hours as needed. -Continue bowel regimen.   2.  Right foot osteomyelitis status post right fifth ray amputation 4/30 -Continue current antibiotic regimen. -WBAT. -Will need outpatient follow-up with orthopedics.   3.  Chronic systolic CHF -Restart oral Lasix.    4.  Hypomagnesemia/hypokalemia -Repleted.    5.  Hypertension -Continue Coreg.      6.  Acute metabolic encephalopathy -Felt secondary to pain meds versus hyponatremia versus withdrawal. -It is noted that patient did have some intermittent hallucinations. -Ammonia level at 38. -CT head without any acute abnormalities. -MRI brain negative for acute abnormalities. -Mental status has improved     7.  Persistent A-fib -Coreg for rate control.  -Eliquis  was held for 1 week and resumed 09/08/2022.   8.  Moderate pleural effusion -Stable on CT abdomen and pelvis on 09/14/2022 Restart oral Lasix    9.  Constipation -Improved .  Continue laxatives as  needed.   10.  Hypotension -Resolved.   11.  Postop acute blood loss anemia -Likely loss postoperatively. -Started posttransfusion 1  unit PRBCs hemoglobin currently stable at 8.7. -Follow H&H. -Transfusion threshold hemoglobin < 7.   12.  AKI -Felt likely secondary to prerenal azotemia secondary to hypotension and dehydration. -Resolved   13.  Hyponatremia -??  Etiology. -Different show includes secondary to volume overload versus poor oral intake. -Urine sodium less than 10, TSH within normal limits, serum osmolality 287, serum cortisol is 12. Lasix was held and he was initially treated with IV fluids.   14.  Diabetes mellitus type 2 -Hemoglobin A1c 9.1 (07/13/2022). -Continue Semglee 20 units daily. -Continue SSI.   Left-sided abdominal pain with swelling CT abdomen pelvis with contrast on 09/04/2022 reviewed.  No acute abnormality noted but there was evidence of small bilateral pleural effusions, bilateral lower lobe atelectasis or infiltrates, diffuse CAD, aortic atherosclerosis, left lower pole nonobstructing nephrolithiasis and anasarca throughout subcutaneous soft tissues. Lasix has been reordered.   Saline nasal spray ordered for discomfort in the ear and nose.  He has been using Flonase nasal spray   Diet Order             Diet Carb Modified Fluid consistency: Thin; Room service appropriate? Yes  Diet effective now                            Consultants: ID: Dr. Thedore Mins 08/23/2022 Wound care RN, Ria Bush 08/24/2022 Palliative care: Richrd Humbles 08/26/2022  Procedures: CT angiogram chest 08/22/2022 CT chest 09/05/2022 CT head 09/03/2022 Chest x-ray 08/20/2022, 09/01/2022, 09/13/2022 PICC line placement 09/01/2022 Plan films of the T-spine 08/30/2022 MRI of the T-spine 08/22/2022 MRI of the brain 09/05/2022 T5-6, T6-7, T7-8, T8-9, T9-10, T10-11 posterior/posterior lateral arthrodesis/transpedicular decompression with partial corpectomy, left T8/T8-9 laminectomy, bilateral facetectomy/segmental instrumentation with percutaneously placed pedicle screw and rod construct at T5 to 6-7 to 8-9 to  10-11/Harvest of local autograft/use of morselized allograft/intraoperative neuronavigation with Stealth/intraoperative CT-Per neurosurgery: Dr. Maisie Fus 08/30/2022    Medications:    sodium chloride   Intravenous Once   acetaminophen  1,000 mg Oral TID   alteplase  2 mg Intracatheter Once   apixaban  5 mg Oral BID   atorvastatin  80 mg Oral Daily   busPIRone  15 mg Oral BID   carvedilol  3.125 mg Oral BID   celecoxib  100 mg Oral BID   Chlorhexidine Gluconate Cloth  6 each Topical Daily   digoxin  125 mcg Oral Daily   [START ON 09/28/2022] doxycycline  100 mg Oral Q12H   DULoxetine  20 mg Oral Daily   famotidine  20 mg Oral BID   feeding supplement  237 mL Oral BID BM   fentaNYL  1 patch Transdermal Q48H   fluticasone  1 spray Each Nare Daily   furosemide  20 mg Oral Daily   insulin aspart  0-5 Units Subcutaneous QHS   insulin aspart  0-9 Units Subcutaneous TID WC   insulin glargine-yfgn  20 Units Subcutaneous Daily   lidocaine  1 patch Transdermal Q24H   magnesium oxide  400 mg Oral BID   pantoprazole  40 mg Oral Daily   polyethylene glycol  17 g Oral BID   pregabalin  75 mg Oral QHS   senna-docusate  1 tablet Oral BID   sodium chloride flush  10-40 mL Intracatheter Q12H   sodium chloride flush  3 mL Intravenous  Q12H   Continuous Infusions:  sodium chloride Stopped (09/01/22 1621)   sodium chloride 5 mL/hr at 09/13/22 1125   DAPTOmycin (CUBICIN) 650 mg in sodium chloride 0.9 % IVPB 650 mg (09/15/22 1353)     Anti-infectives (From admission, onward)    Start     Dose/Rate Route Frequency Ordered Stop   09/28/22 1000  doxycycline (VIBRA-TABS) tablet 100 mg        100 mg Oral Every 12 hours 09/01/22 1113     08/31/22 2000  DAPTOmycin (CUBICIN) 650 mg in sodium chloride 0.9 % IVPB        8 mg/kg  83.3 kg 126 mL/hr over 30 Minutes Intravenous Daily 08/31/22 1053 09/27/22 2359   08/30/22 2300  vancomycin (VANCOREADY) IVPB 750 mg/150 mL  Status:  Discontinued        750  mg 150 mL/hr over 60 Minutes Intravenous Every 12 hours 08/30/22 1150 08/31/22 1053   08/30/22 1712  vancomycin (VANCOCIN) powder  Status:  Discontinued          As needed 08/30/22 1714 08/30/22 1756   08/30/22 1142  ceFAZolin (ANCEF) 2-4 GM/100ML-% IVPB  Status:  Discontinued       Note to Pharmacy: [facility], [place]: cabinet override      08/30/22 1142 08/30/22 1300   08/30/22 0600  ceFAZolin (ANCEF) IVPB 2g/100 mL premix        2 g 200 mL/hr over 30 Minutes Intravenous On call to O.R. 08/29/22 1848 08/30/22 1401   08/27/22 1041  vancomycin (VANCOCIN) IVPB 1000 mg/200 mL premix  Status:  Discontinued        1,000 mg 200 mL/hr over 60 Minutes Intravenous Every 12 hours 08/27/22 0802 08/30/22 1150   08/26/22 2300  vancomycin (VANCOREADY) IVPB 1250 mg/250 mL  Status:  Discontinued        1,250 mg 166.7 mL/hr over 90 Minutes Intravenous Every 24 hours 08/26/22 1014 08/27/22 0802   08/23/22 2200  vancomycin (VANCOREADY) IVPB 750 mg/150 mL  Status:  Discontinued        750 mg 150 mL/hr over 60 Minutes Intravenous Every 12 hours 08/23/22 0938 08/26/22 1014   08/23/22 1030  vancomycin (VANCOREADY) IVPB 1500 mg/300 mL        1,500 mg 150 mL/hr over 120 Minutes Intravenous  Once 08/23/22 0938 08/23/22 1312   08/20/22 2200  doxycycline (VIBRA-TABS) tablet 100 mg  Status:  Discontinued        100 mg Oral 2 times daily 08/20/22 1737 08/23/22 1610              Family Communication/Anticipated D/C date and plan/Code Status   DVT prophylaxis: SCD's Start: 08/30/22 1939 apixaban (ELIQUIS) tablet 5 mg     Code Status: Full Code  Family Communication: None Disposition Plan: Plan to discharge to SNF   Status is: Inpatient Remains inpatient appropriate because: Awaiting placement to SNF       Subjective:   Interval events noted.  Left-sided abdominal pain is better.  He feels a little short of breath.  He complains of fullness in the ear and nose.  He requested a saline nasal spray.     Objective:    Vitals:   09/14/22 2345 09/15/22 0427 09/15/22 0833 09/15/22 1249  BP: (!) 149/84 (!) 141/95 (!) 146/86 (!) 148/97  Pulse: 61 60 (!) 56 63  Resp: 16 16 17 16   Temp: 97.7 F (36.5 C) 98.1 F (36.7 C) 98 F (36.7 C) 98.2 F (36.8 C)  TempSrc: Oral Oral Oral Oral  SpO2: 95% 94% (!) 85%   Weight:      Height:       No data found.   Intake/Output Summary (Last 24 hours) at 09/15/2022 1539 Last data filed at 09/15/2022 0810 Gross per 24 hour  Intake 170.83 ml  Output 2775 ml  Net -2604.17 ml   Filed Weights   08/20/22 1821 08/23/22 2307  Weight: 77.8 kg 83.3 kg    Exam:  GEN: NAD SKIN: Warm and dry.  Dressing on incisional wound on the back looks clean, dry and intact. EYES: No pallor or icterus ENT: MMM, no ear discharge, mastoid or tragus tenderness CV: RRR PULM: CTA B ABD: soft, distended, nontender, +BS CNS: AAO x 3, non focal EXT: Bilateral leg edema, no tenderness or erythema GU: Foley catheter draining amber urine   Data Reviewed:   I have personally reviewed following labs and imaging studies:  Labs: Labs show the following:   Basic Metabolic Panel: Recent Labs  Lab 09/09/22 0510 09/10/22 0430 09/11/22 0600 09/12/22 0503 09/13/22 0434 09/14/22 0330  NA 130* 130* 128* 130* 131* 130*  K 4.4 4.5 4.3 4.8 5.1 4.3  CL 99 97* 98 98 101 103  CO2 23 23 23 23 22  20*  GLUCOSE 136* 88 97 153* 131* 111*  BUN 16 16 12 12 11 10   CREATININE 1.23 1.20 1.15 1.21 1.06 1.08  CALCIUM 8.9 8.8* 8.8* 8.6* 7.6* 8.7*  MG 1.8  --  1.9 1.9  --  1.8   GFR Estimated Creatinine Clearance: 79.8 mL/min (by C-G formula based on SCr of 1.08 mg/dL). Liver Function Tests: No results for input(s): "AST", "ALT", "ALKPHOS", "BILITOT", "PROT", "ALBUMIN" in the last 168 hours. No results for input(s): "LIPASE", "AMYLASE" in the last 168 hours. No results for input(s): "AMMONIA" in the last 168 hours. Coagulation profile No results for input(s): "INR", "PROTIME"  in the last 168 hours.  CBC: Recent Labs  Lab 09/09/22 0510 09/10/22 0430 09/14/22 0330  WBC 8.6 9.1 10.1  HGB 8.9* 8.7* 9.3*  HCT 27.3* 27.3* 29.1*  MCV 90.1 89.8 93.0  PLT 304 273 260   Cardiac Enzymes: Recent Labs  Lab 09/15/22 0932  CKTOTAL 25*   BNP (last 3 results) No results for input(s): "PROBNP" in the last 8760 hours. CBG: Recent Labs  Lab 09/14/22 1248 09/14/22 1739 09/14/22 2134 09/15/22 0627 09/15/22 1245  GLUCAP 118* 117* 224* 120* 174*   D-Dimer: No results for input(s): "DDIMER" in the last 72 hours. Hgb A1c: No results for input(s): "HGBA1C" in the last 72 hours. Lipid Profile: No results for input(s): "CHOL", "HDL", "LDLCALC", "TRIG", "CHOLHDL", "LDLDIRECT" in the last 72 hours. Thyroid function studies: No results for input(s): "TSH", "T4TOTAL", "T3FREE", "THYROIDAB" in the last 72 hours.  Invalid input(s): "FREET3" Anemia work up: No results for input(s): "VITAMINB12", "FOLATE", "FERRITIN", "TIBC", "IRON", "RETICCTPCT" in the last 72 hours. Sepsis Labs: Recent Labs  Lab 09/09/22 0510 09/10/22 0430 09/14/22 0330  WBC 8.6 9.1 10.1    Microbiology No results found for this or any previous visit (from the past 240 hour(s)).  Procedures and diagnostic studies:  CT ABDOMEN PELVIS W CONTRAST  Result Date: 09/14/2022 CLINICAL DATA:  Left lower quadrant abdominal pain, swelling EXAM: CT ABDOMEN AND PELVIS WITH CONTRAST TECHNIQUE: Multidetector CT imaging of the abdomen and pelvis was performed using the standard protocol following bolus administration of intravenous contrast. RADIATION DOSE REDUCTION: This exam was performed according to the departmental dose-optimization  program which includes automated exposure control, adjustment of the mA and/or kV according to patient size and/or use of iterative reconstruction technique. CONTRAST:  75mL OMNIPAQUE IOHEXOL 350 MG/ML SOLN COMPARISON:  07/12/2022 FINDINGS: Lower chest: Small bilateral pleural  effusions, increasing since prior study. Bibasilar airspace opacities, right greater than left. This most likely reflects compressive atelectasis although pneumonia cannot be excluded in the right lower lobe. Diffuse coronary artery disease. Hepatobiliary: Gallbladder wall appears slightly thickened and indistinct suggesting the possibility of inflammation. No visible stones. No focal hepatic abnormality. Pancreas: No focal abnormality or ductal dilatation. Spleen: No focal abnormality.  Normal size. Adrenals/Urinary Tract: 3 mm nonobstructing stone in the lower pole of the left kidney. No stones or hydronephrosis on the right. No renal or adrenal mass. Urinary bladder decompressed with Foley catheter in place. Stomach/Bowel: Stomach, large and small bowel grossly unremarkable. Vascular/Lymphatic: No evidence of aneurysm or adenopathy. Aortic atherosclerosis. Reproductive: No visible focal abnormality. Other: Small amount of free fluid in the cul-de-sac. No free air. Anasarca like edema throughout the abdominal wall. Musculoskeletal: No acute bony abnormality. IMPRESSION: Slight apparent gallbladder wall thickening and indistinctness suggesting the possibility of inflammation. No visible stones. This could be further evaluated with right upper quadrant ultrasound if felt clinically indicated. Small bilateral pleural effusions. Bilateral lower lobe atelectasis or infiltrates, right greater than left. Cannot exclude pneumonia in the right lower lobe. Diffuse coronary artery disease, aortic atherosclerosis. Small amount of free fluid in the pelvis. Left lower pole nonobstructing nephrolithiasis. Anasarca like edema throughout the subcutaneous soft tissues. Electronically Signed   By: Charlett Nose M.D.   On: 09/14/2022 19:19   DG CHEST PORT 1 VIEW  Result Date: 09/13/2022 CLINICAL DATA:  PICC line placement EXAM: PORTABLE CHEST 1 VIEW COMPARISON:  09/01/2022 FINDINGS: A right PICC catheter has been placed. Tip is  partially obscured by orthopedic hardware but appears to project over the mid to upper SVC region. No pneumothorax. Cardiac enlargement. Bilateral pleural effusions with perihilar infiltrates, progressing since prior study. IMPRESSION: 1. Right PICC line placed to the level of the upper to mid SVC. 2. Cardiac enlargement with perihilar infiltrates and bilateral pleural effusions. Electronically Signed   By: Burman Nieves M.D.   On: 09/13/2022 22:35               LOS: 23 days   Talesha Ellithorpe  [facility]   Pager on [email or web address]. If 7PM-7AM, please contact night-coverage at [email or web address]     09/15/2022, 3:39 PM

## 2022-09-15 NOTE — Plan of Care (Signed)
  Problem: Education: Goal: Understanding of CV disease, CV risk reduction, and recovery process will improve Outcome: Progressing Goal: Individualized Educational Video(s) Outcome: Progressing   

## 2022-09-15 NOTE — Progress Notes (Signed)
Physical Therapy Treatment Patient Details Name: David [name] MRN: [id number] DOB: [birth year] Today's Date: 09/15/2022   History of Present Illness Pt is a 62 y.o. male who presented 08/20/22  with hypoglycemia. 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 5/28 surgical repair: T5-11 posterior/posterolateral arthrodesis  & Transpedicular decompression with partial corpectomy, left T8 & T8-9 laminectomy, bilateral facetectomy. 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 Explained sometimes we don't have the tech available in the p.m. With explanation, he was happy to work with PT. Unable to work with [employer] on ambulation due to lack of +4 assist. Worked on standing and marching in place to work on endurance, ability to extend hips in stance and avoid knee buckling, and ability to step feet shoulder width apart (and not adduct legs when lifted). Patient moves quickly and does not attend to getting hip extension and upright torso prior to lifting opposite foot. Each knee buckling in stance and he catches himself with UE support and shin pad on the stedy. Despite buckling, he proceeds with quick steps and eventually loses his balance requiring +2 to safely land on the seat of the stedy. He becomes dyspneic and requires seated rest prior to attempting again (with similar results). I think pt can do a better job of engaging his hip/knee extensors and avoid buckling if he will slow down long enough to set himself up for success.     Recommendations for follow up therapy are one component of a  multi-disciplinary discharge planning process, led by the attending physician.  Recommendations may be updated based on patient status, additional functional criteria and insurance authorization.  Follow Up Recommendations  Can patient physically be transported by private vehicle: No    Assistance Recommended at Discharge Frequent or constant Supervision/Assistance  Patient can return home with the following Assist for transportation;Two people to help with walking and/or transfers;A lot of help with bathing/dressing/bathroom;Assistance with cooking/housework;Direct supervision/assist for financial management;Direct supervision/assist for medications management   Equipment Recommendations  Rolling walker (2 wheels);Wheelchair cushion (measurements PT);Wheelchair (measurements PT);BSC/3in1 (drop arm BSC)    Recommendations for Other Services       Precautions / Restrictions Precautions Precautions: Fall;Back Precaution Booklet Issued: No Required Braces or Orthoses: Spinal Brace Spinal Brace: Thoracolumbosacral orthotic;Applied in sitting position Other Brace: for use when ambulating Restrictions Weight Bearing Restrictions: Yes RLE Weight Bearing: Weight bearing as tolerated RLE Partial Weight Bearing Percentage or Pounds: heel weight bearing Other Position/Activity Restrictions: with Darco shoe     Mobility  Bed Mobility Overal bed mobility: Needs Assistance Bed Mobility: Rolling, Sidelying to Sit Rolling: Supervision Sidelying to sit: HOB elevated, Min guard (wiht rail)       General bed mobility comments: Heavy use of bed rails and increased difficulty pushing up on his L elbow but was able to gain sitting without help to raise torso (no assist other than HOB elevated and rail)    Transfers Overall transfer level: Needs assistance Equipment used: Ambulation equipment used Transfers: Sit to/from Stand, Bed to chair/wheelchair/BSC Sit to Stand: Min assist, +2 physical  assistance, Mod assist  General transfer comment: pt min assist to stand from elevated bed and stedy flaps; +2 mod assist to stand from recliner with seat elevated with pillow; multiple reps of standing ~6 Transfer via Lift Equipment: Stedy  Ambulation/Gait             Pre-gait activities: in stedy worked on weight shift, hip extensor activation and then lifting opposite leg (slow marching); pt gets moving too fast and stance leg buckles nearly every step. Legs tend to adduct and pt cannot step Rt foot laterally even with cues (does better with left foot, but at times not attending to this as he rushes); ~4 rounds of marching with +2 assist due to at times shifts too far and knees buckle General Gait Details: unable due to lack of +4 assist for safety   Stairs             Wheelchair Mobility    Modified Rankin (Stroke Patients Only)       Balance Overall balance assessment: Needs assistance Sitting-balance support: Bilateral upper extremity supported, Feet supported, No upper extremity supported Sitting balance-Leahy Scale: Fair Sitting balance - Comments: Able to reach off BOS to donn brace with min guard, x1 minor LOB posteriorly needing minA to recover   Standing balance support: Bilateral upper extremity supported, During functional activity Standing balance-Leahy Scale: Poor Standing balance comment: heavy reliance on UE support and lesser external physical assistance                            Cognition Arousal/Alertness: Awake/alert Behavior During Therapy: WFL for tasks assessed/performed Overall Cognitive Status: No family/caregiver present to determine baseline cognitive functioning Area of Impairment: Memory, Problem solving, Attention, Following commands, Awareness, Safety/judgement                   Current Attention Level: Selective Memory: Decreased short-term memory Following Commands: Follows multi-step commands with  increased time Safety/Judgement: Decreased awareness of deficits, Decreased awareness of safety Awareness: Emergent, Anticipatory Problem Solving: Slow processing, Requires verbal cues General Comments: poor awareness of decr balance, knees buckling, and overall safety as working on marching in the stedy lift;        Exercises Other Exercises Other Exercises: pt with significant dyspnea during  session--reports due to fentanyl given prior to PT. Educated on use of IS 10x/hour during waking hours. Pt reports he was doing 5 reps, 5x/day.    General Comments        Pertinent Vitals/Pain Pain Assessment Pain Assessment: 0-10 Pain Score: 8  Pain Location: back Pain Descriptors / Indicators: Discomfort, Grimacing, Guarding Pain Intervention(s): Limited activity within patient's tolerance, Monitored during session, Premedicated before session, Repositioned    Home Living                          Prior Function            PT Goals (current goals can now be found in the care plan section) Acute Rehab PT Goals Patient Stated Goal: to walk again PT Goal Formulation: With patient Time For Goal Achievement: 09/28/22 Potential to Achieve Goals: Good Progress towards PT goals: Progressing toward goals (Goals updated due to timeframe)    Frequency    Min 5X/week      PT Plan Current plan remains appropriate    Co-evaluation              AM-PAC PT "6 Clicks"  Mobility   Outcome Measure  Help needed turning from your back to your side while in a flat bed without using bedrails?: A Little Help needed moving from lying on your back to sitting on the side of a flat bed without using bedrails?: A Little Help needed moving to and from a bed to a chair (including a wheelchair)?: Total Help needed standing up from a chair using your arms (e.g., wheelchair or bedside chair)?: Total Help needed to walk in hospital room?: Total Help needed climbing 3-5 steps with a  railing? : Total 6 Click Score: 10    End of Session Equipment Utilized During Treatment: Back brace;Gait belt;Other (comment) (Darco shoe R) Activity Tolerance: Treatment limited secondary to medical complications (Comment) (dyspnea; required multiple sitting rest breaks) Patient left: in chair;with call bell/phone within reach;with chair alarm set   PT Visit Diagnosis: Difficulty in walking, not elsewhere classified (R26.2);Other abnormalities of gait and mobility (R26.89);Muscle weakness (generalized) (M62.81);Pain;Unsteadiness on feet (R26.81) Pain - Right/Left: Right Pain - part of body:  (back)     Time: 1610-9604 PT Time Calculation (min) (ACUTE ONLY): 33 min  Charges:  $Gait Training: 23-37 mins                      Jerolyn Center, PT Acute Rehabilitation Services  Office [phone]    Zena Amos 09/15/2022, 11:20 AM

## 2022-09-15 NOTE — Plan of Care (Signed)
  Problem: Education: Goal: Understanding of CV disease, CV risk reduction, and recovery process will improve Outcome: Progressing Goal: Individualized Educational Video(s) Outcome: Progressing   Problem: Activity: Goal: Ability to return to baseline activity level will improve Outcome: Progressing   Problem: Cardiovascular: Goal: Ability to achieve and maintain adequate cardiovascular perfusion will improve Outcome: Progressing Goal: Vascular access site(s) Level 0-1 will be maintained Outcome: Progressing   Problem: Health Behavior/Discharge Planning: Goal: Ability to safely manage health-related needs after discharge will improve Outcome: Progressing   Problem: Education: Goal: Knowledge of General Education information will improve Description: Including pain rating scale, medication(s)/side effects and non-pharmacologic comfort measures Outcome: Progressing   Problem: Health Behavior/Discharge Planning: Goal: Ability to manage health-related needs will improve Outcome: Progressing   Problem: Clinical Measurements: Goal: Ability to maintain clinical measurements within normal limits will improve Outcome: Progressing Goal: Will remain free from infection Outcome: Progressing Goal: Diagnostic test results will improve Outcome: Progressing Goal: Respiratory complications will improve Outcome: Progressing Goal: Cardiovascular complication will be avoided Outcome: Progressing   Problem: Activity: Goal: Risk for activity intolerance will decrease Outcome: Progressing   Problem: Nutrition: Goal: Adequate nutrition will be maintained Outcome: Progressing   Problem: Coping: Goal: Level of anxiety will decrease Outcome: Progressing   Problem: Elimination: Goal: Will not experience complications related to bowel motility Outcome: Progressing Goal: Will not experience complications related to urinary retention Outcome: Progressing   Problem: Pain Managment: Goal:  General experience of comfort will improve Outcome: Progressing   Problem: Safety: Goal: Ability to remain free from injury will improve Outcome: Progressing   Problem: Skin Integrity: Goal: Risk for impaired skin integrity will decrease Outcome: Progressing   Problem: Education: Goal: Ability to describe self-care measures that may prevent or decrease complications (Diabetes Survival Skills Education) will improve Outcome: Progressing Goal: Individualized Educational Video(s) Outcome: Progressing   Problem: Coping: Goal: Ability to adjust to condition or change in health will improve Outcome: Progressing   Problem: Fluid Volume: Goal: Ability to maintain a balanced intake and output will improve Outcome: Progressing   Problem: Health Behavior/Discharge Planning: Goal: Ability to identify and utilize available resources and services will improve Outcome: Progressing Goal: Ability to manage health-related needs will improve Outcome: Progressing   Problem: Metabolic: Goal: Ability to maintain appropriate glucose levels will improve Outcome: Progressing   Problem: Nutritional: Goal: Maintenance of adequate nutrition will improve Outcome: Progressing Goal: Progress toward achieving an optimal weight will improve Outcome: Progressing   Problem: Skin Integrity: Goal: Risk for impaired skin integrity will decrease Outcome: Progressing   Problem: Tissue Perfusion: Goal: Adequacy of tissue perfusion will improve Outcome: Progressing   Problem: Education: Goal: Ability to verbalize activity precautions or restrictions will improve Outcome: Progressing Goal: Knowledge of the prescribed therapeutic regimen will improve Outcome: Progressing Goal: Understanding of discharge needs will improve Outcome: Progressing   Problem: Activity: Goal: Ability to avoid complications of mobility impairment will improve Outcome: Progressing Goal: Ability to tolerate increased activity  will improve Outcome: Progressing Goal: Will remain free from falls Outcome: Progressing

## 2022-09-15 NOTE — Plan of Care (Signed)
  Problem: Education: Goal: Understanding of CV disease, CV risk reduction, and recovery process will improve 09/15/2022 0524 by Charmian Muff, RN Outcome: Progressing 09/15/2022 0100 by Charmian Muff, RN Outcome: Progressing 09/15/2022 0057 by Charmian Muff, RN Outcome: Progressing Goal: Individualized Educational Video(s) 09/15/2022 0524 by Charmian Muff, RN Outcome: Progressing 09/15/2022 0100 by Charmian Muff, RN Outcome: Progressing 09/15/2022 0057 by Charmian Muff, RN Outcome: Progressing   Problem: Activity: Goal: Ability to return to baseline activity level will improve 09/15/2022 0524 by Charmian Muff, RN Outcome: Progressing 09/15/2022 0100 by Charmian Muff, RN Outcome: Progressing   Problem: Cardiovascular: Goal: Ability to achieve and maintain adequate cardiovascular perfusion will improve 09/15/2022 0524 by Charmian Muff, RN Outcome: Progressing 09/15/2022 0100 by Charmian Muff, RN Outcome: Progressing   Problem: Education: Goal: Understanding of CV disease, CV risk reduction, and recovery process will improve 09/15/2022 0524 by Charmian Muff, RN Outcome: Progressing 09/15/2022 0100 by Charmian Muff, RN Outcome: Progressing 09/15/2022 0057 by Charmian Muff, RN Outcome: Progressing Goal: Individualized Educational Video(s) 09/15/2022 0524 by Charmian Muff, RN Outcome: Progressing 09/15/2022 0100 by Charmian Muff, RN Outcome: Progressing 09/15/2022 0057 by Charmian Muff, RN Outcome: Progressing   Problem: Activity: Goal: Ability to return to baseline activity level will improve 09/15/2022 0524 by Charmian Muff, RN Outcome: Progressing 09/15/2022 0100 by Charmian Muff, RN Outcome: Progressing   Problem: Cardiovascular: Goal: Ability to achieve and maintain adequate cardiovascular perfusion will improve 09/15/2022 0524 by Charmian Muff, RN Outcome: Progressing 09/15/2022 0100 by Charmian Muff, RN Outcome: Progressing

## 2022-09-15 NOTE — Plan of Care (Signed)
  Problem: Education: Goal: Understanding of CV disease, CV risk reduction, and recovery process will improve 09/15/2022 0100 by Charmian Muff, RN Outcome: Progressing 09/15/2022 0057 by Charmian Muff, RN Outcome: Progressing Goal: Individualized Educational Video(s) 09/15/2022 0100 by Charmian Muff, RN Outcome: Progressing 09/15/2022 0057 by Charmian Muff, RN Outcome: Progressing   Problem: Activity: Goal: Ability to return to baseline activity level will improve Outcome: Progressing   Problem: Cardiovascular: Goal: Ability to achieve and maintain adequate cardiovascular perfusion will improve Outcome: Progressing

## 2022-09-16 ENCOUNTER — Inpatient Hospital Stay (HOSPITAL_COMMUNITY): Payer: Medicaid Other

## 2022-09-16 DIAGNOSIS — M869 Osteomyelitis, unspecified: Secondary | ICD-10-CM | POA: Diagnosis not present

## 2022-09-16 DIAGNOSIS — E162 Hypoglycemia, unspecified: Secondary | ICD-10-CM | POA: Diagnosis not present

## 2022-09-16 LAB — GLUCOSE, CAPILLARY
Glucose-Capillary: 96 mg/dL (ref 70–99)
Glucose-Capillary: 131 mg/dL — ABNORMAL HIGH (ref 70–99)
Glucose-Capillary: 104 mg/dL — ABNORMAL HIGH (ref 70–99)
Glucose-Capillary: 98 mg/dL (ref 70–99)

## 2022-09-16 NOTE — Progress Notes (Signed)
Physical Therapy Treatment Patient Details Name: David [name] MRN: [id number] DOB: Sep 19, 1960 Today's Date: 09/16/2022   History of Present Illness Pt is a 62 y.o. male who presented 08/20/22  with hypoglycemia. 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 5/28 surgical repair: T5-11 posterior/posterolateral arthrodesis  & Transpedicular decompression with partial corpectomy, left T8 & T8-9 laminectomy, bilateral facetectomy. 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 Pt able to pull up in stedy frame from recliner with min guard for safety and continued pre-gait activities, marching in place with concentration on hip flexor activation as well as posture and LE control/coordination. Pt able to complete standing balance exercises with single UE support. Pt tearful throughout session verbalizing frustration with slow progress. Pt continues to benefit from skilled PT services to progress toward functional mobility goals.    Recommendations for follow up therapy are one component of a multi-disciplinary discharge planning process, led by the attending physician.  Recommendations may be updated based on patient status, additional functional criteria and insurance authorization.  Follow Up Recommendations  Can patient physically be transported by private vehicle: No    Assistance Recommended at Discharge Frequent or constant Supervision/Assistance  Patient can return home with the following Assist for transportation;Two people to help with walking  and/or transfers;A lot of help with bathing/dressing/bathroom;Assistance with cooking/housework;Direct supervision/assist for financial management;Direct supervision/assist for medications management   Equipment Recommendations  Rolling walker (2 wheels);Wheelchair cushion (measurements PT);Wheelchair (measurements PT);BSC/3in1    Recommendations for Other Services       Precautions / Restrictions Precautions Precautions: Fall;Back Required Braces or Orthoses: Spinal Brace Spinal Brace: Thoracolumbosacral orthotic;Applied in sitting position Restrictions Weight Bearing Restrictions: Yes RLE Weight Bearing: Weight bearing as tolerated     Mobility  Bed Mobility Overal bed mobility: Needs Assistance             General bed mobility comments: pt up in chair on arrival    Transfers Overall transfer level: Needs assistance Equipment used: Ambulation equipment used Transfers: Sit to/from Stand Sit to Stand: Min guard           General transfer comment: able to pull self into Stedy from recliner with min guard for safety    Ambulation/Gait             Pre-gait activities: in stedy worked on weight shift, hip extensor activation and then lifting opposite leg (slow marching); pt with more controlled marches this session, cues to keep hips forward and chest up throughout     [employer]    Modified Rankin (Stroke Patients Only)       Balance Overall balance assessment: Needs assistance Sitting-balance support: Bilateral upper extremity supported, Feet supported, No upper extremity supported Sitting balance-Leahy Scale: Fair     Standing balance support: Bilateral upper extremity supported, During functional activity Standing balance-Leahy Scale: Poor                 High Level Balance Comments: working in  static standing with single UE support R and L            Cognition Arousal/Alertness:  Awake/alert Behavior During Therapy: WFL for tasks assessed/performed Overall Cognitive Status: No family/caregiver present to determine baseline cognitive functioning Area of Impairment: Memory, Problem solving, Attention, Following commands, Awareness, Safety/judgement                   Current Attention Level: Selective Memory: Decreased short-term memory Following Commands: Follows multi-step commands with increased time Safety/Judgement: Decreased awareness of deficits, Decreased awareness of safety Awareness: Emergent, Anticipatory Problem Solving: Slow processing, Requires verbal cues General Comments: decreased insight into safety and can be impulsive at times; easily redirectable        Exercises      General Comments        Pertinent Vitals/Pain Pain Assessment Pain Assessment: Faces Faces Pain Scale: Hurts even more Pain Location: lower part of incision Pain Descriptors / Indicators: Discomfort, Grimacing, Guarding Pain Intervention(s): Monitored during session, Premedicated before session, Limited activity within patient's tolerance    Home Living                          Prior Function            PT Goals (current goals can now be found in the care plan section) Acute Rehab PT Goals PT Goal Formulation: With patient Time For Goal Achievement: 09/28/22 Progress towards PT goals: Progressing toward goals    Frequency    Min 5X/week      PT Plan Current plan remains appropriate    Co-evaluation              AM-PAC PT "6 Clicks" Mobility   Outcome Measure  Help needed turning from your back to your side while in a flat bed without using bedrails?: A Little Help needed moving from lying on your back to sitting on the side of a flat bed without using bedrails?: A Little Help needed moving to and from a bed to a chair (including a wheelchair)?: Total Help needed standing up from a chair using your arms (e.g., wheelchair or  bedside chair)?: Total Help needed to walk in hospital room?: Total Help needed climbing 3-5 steps with a railing? : Total 6 Click Score: 10    End of Session Equipment Utilized During Treatment: Back brace Activity Tolerance: Patient tolerated treatment well;Patient limited by pain Patient left: in chair;with call bell/phone within reach Nurse Communication: Mobility status PT Visit Diagnosis: Difficulty in walking, not elsewhere classified (R26.2);Other abnormalities of gait and mobility (R26.89);Muscle weakness (generalized) (M62.81);Pain;Unsteadiness on feet (R26.81) Pain - Right/Left: Right Pain - part of body: Ankle and joints of foot     Time: 1456-1520 PT Time Calculation (min) (ACUTE ONLY): 24 min  Charges:  $Therapeutic Activity: 23-37 mins                     Anais Koenen R. PTA Acute Rehabilitation Services Office: [phone]   Catalina Antigua 09/16/2022, 4:17 PM

## 2022-09-16 NOTE — Progress Notes (Signed)
Newly exchanged PICC now displaced toward neck. Due to repeat malpositions and difficulty attaining central position, VAST recommends referral to IR for reposition or replacement. Lata RN notified to tape over current PICC ports and not to use device.

## 2022-09-16 NOTE — Progress Notes (Signed)
PROGRESS NOTE   David [name]  VOZ:[id number] DOB: 18-Jan-1961 DOA: 08/20/2022 PCP: [name], MD   Date of Service: the patient was seen and examined on 09/16/2022  Brief Narrative:  David [name] is a 62 y.o. 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 Chestine Spore on 4/25 persistent A-fib on Eliquis, DM-2, seizure disorder, HTN, HLD and chronic pain return to ED with hypoglycemia to 38. Patient was hospitalized for disseminated MRSA bacteremia from 4/9-5/17, and discharged on p.o. doxycycline. Patient was hypoglycemic to 38 when EMS arrived. His hypoglycemia persisted despite intervention in ED and leading to his rehospitalization. He also have significant back pain. MRI thoracic spine concerning for progressive discitis/osteomyelitis at T7-T9, extensive phlegmonous change in the paravertebral soft tissues, partial collapse of T8 vertebral body with 40% vertebral body height loss and approximately 6 mm bony retropulsion, moderate canal stenosis and mild cord edema. Neurosurgery and ID consulted. Patient underwent surgical decompression by neurosurgery. Palliative medicine helping with pain management. Therapy recommended CIR, unfortunately, pt does not have caregiver support, hence SNF is now recommended. TOC on board and assisting with disposition      Assessment and Plan:   Disseminated MRSA bacteremia with T7-T9 discitis and osteomyelitis Hospitalized for disseminated MRSA bacteremia from 4/9-5/17, and discharged on p.o. doxycycline. Pt admitted again on 5/18 for hypoglycemia. Marland Kitchen MRI thoracic spine concerning for progressive discitis/osteomyelitis at T7-T9, extensive phlegmonous change in the paravertebral soft tissues, partial collapse of T8 vertebral body with 40% vertebral body height loss and approximately 6 mm bony retropulsion, moderate canal stenosis and mild cord edema. Underwent surgical  intervention 5/29 per neurosurgery, JP drain subsequently removed.Neurosurgery and ID consulted. Patient underwent surgical decompression by neurosurgery. Patient seen by ID and neurosurgery during this hospitalization. -Continue Daptomycin x 28 doses from 08/31/22 per ID -Palliative care following and helping manage patient's pain. -Continue Robaxin 750 mg every 6 hours as needed, Toradol 50 mg IV every 8 hours as needed, Lyrica 75 mg nightly, Lidoderm patch, celecoxib 100 mg twice daily, BuSpar 15 mg twice daily, fentanyl patch 50 mcg every 72 hours, oxycodone 10 mg every 4 hours as needed. -Continue bowel regimen.   Right foot osteomyelitis status post right fifth ray amputation 4/30 -Continue current antibiotic regimen. -WBAT. -Will need outpatient follow-up with orthopedics.   Chronic systolic CHF -Restart oral Lasix.   Hypomagnesemia/hypokalemia -Repleted.    Hypertension -Continue Coreg.      Acute metabolic encephalopathy Mental status has improved. Likely secondary to pain meds versus hyponatremia versus withdrawal.-CT head and MRI brainwithout any acute abnormalities.   Persistent A-fib -Continue Coreg for rate control.  -Eliquis  was held for 1 week and resumed 09/08/2022.    Moderate pleural effusion Stable on CT abdomen and pelvis on 09/14/2022 -Continue Lasix   Constipation Improved - Continue laxatives as needed.   Hypotension Resolved.   Postop acute blood loss anemia Likely loss postoperatively. Started posttransfusion 1 unit PRBCs hemoglobin currently stable at 8.7. -Follow H&H. -Transfusion threshold hemoglobin < 7.   AKI Resolved. Felt likely secondary to prerenal azotemia secondary to hypotension and dehydration.    Hyponatremia -??  Etiology. -Different show includes secondary to volume overload versus poor oral intake. -Urine sodium less than 10, TSH within normal limits, serum osmolality 287, serum cortisol is 12. Lasix was held and he was  initially treated with IV fluids.    Diabetes mellitus type 2 -Hemoglobin A1c 9.1 (07/13/2022). -  Continue Semglee 20 units daily. -Continue SSI.   Left-sided abdominal pain with swelling CT abdomen pelvis with contrast on 09/04/2022 reviewed.  No acute abnormality noted but there was evidence of small bilateral pleural effusions, bilateral lower lobe atelectasis or infiltrates, diffuse CAD, aortic atherosclerosis, left lower pole nonobstructing nephrolithiasis and anasarca throughout subcutaneous soft tissues. Lasix has been reordered.    Subjective:  No acute concerns from patient.  Physical Exam:  Vitals:   09/15/22 2131 09/16/22 0005 09/16/22 0434 09/16/22 0903  BP: (!) 151/83 (!) 146/85 138/80 (!) 162/99  Pulse: (!) 57 61 60 85  Resp: 16 16 16 19   Temp: 98 F (36.7 C) 97.9 F (36.6 C) 97.7 F (36.5 C) 97.8 F (36.6 C)  TempSrc: Axillary Oral Axillary Axillary  SpO2: 95% 93% 94% 93%  Weight:      Height:        General: Alert, no acute distress Cardio: Normal S1 and S2, RRR, no r/m/g Pulm: CTAB, normal work of breathing Abdomen: Bowel sounds normal. Abdomen soft and non-tender.  Extremities: No peripheral edema.  Neuro: Cranial nerves grossly intact  Back incision as below      Data Reviewed:  I have personally reviewed and interpreted labs, imaging.   CBC: Recent Labs  Lab 09/10/22 0430 09/14/22 0330  WBC 9.1 10.1  HGB 8.7* 9.3*  HCT 27.3* 29.1*  MCV 89.8 93.0  PLT 273 260   Basic Metabolic Panel: Recent Labs  Lab 09/10/22 0430 09/11/22 0600 09/12/22 0503 09/13/22 0434 09/14/22 0330  NA 130* 128* 130* 131* 130*  K 4.5 4.3 4.8 5.1 4.3  CL 97* 98 98 101 103  CO2 23 23 23 22  20*  GLUCOSE 88 97 153* 131* 111*  BUN 16 12 12 11 10   CREATININE 1.20 1.15 1.21 1.06 1.08  CALCIUM 8.8* 8.8* 8.6* 7.6* 8.7*  MG  --  1.9 1.9  --  1.8   GFR: Estimated Creatinine Clearance: 79.8 mL/min (by C-G formula based on SCr of 1.08 mg/dL). Liver Function  Tests: No results for input(s): "AST", "ALT", "ALKPHOS", "BILITOT", "PROT", "ALBUMIN" in the last 168 hours.  Coagulation Profile: No results for input(s): "INR", "PROTIME" in the last 168 hours.    Code Status:  Full code.  Code status decision has been confirmed with: patient  Family Communication:     Severity of Illness:  The appropriate patient status for this patient is INPATIENT. Inpatient status is judged to be reasonable and necessary in order to provide the required intensity of service to ensure the patient's safety. The patient's presenting symptoms, physical exam findings, and initial radiographic and laboratory data in the context of their chronic comorbidities is felt to place them at high risk for further clinical deterioration. Furthermore, it is not anticipated that the patient will be medically stable for discharge from the hospital within 2 midnights of admission.   * I certify that at the point of admission it is my clinical judgment that the patient will require inpatient hospital care spanning beyond 2 midnights from the point of admission due to high intensity of service, high risk for further deterioration and high frequency of surveillance required.*   Rolm Gala MD  09/16/2022 9:16 AM

## 2022-09-16 NOTE — Consult Note (Signed)
WOC Nurse Consult Note: Reason for Consult: concern for acute changes to neurosurgery operative site 08/30/22 PREOP DIAGNOSIS: T8-9 osteomylelitis/discitis with pathologic fracture   POSTOP DIAGNOSIS: T8-9 osteomylelitis/discitis with pathologic fracture   PROCEDURE: 1. T5-6, T6-7, T7-8, T8-9, T9-10, T10-11 posterior/posterolateral arthrodesis 2. Transpedicular decompression with partial corpectomy, left T8 3. T8-9 laminectomy, bilateral facetectomy 4. Segmental instrumentation with percutaneously placed pedicle screw and rod construct at T5-6-7-11-10-08-11 5. Harvest of local autograft 6. Use of morselized allograft     Wound type:surgical  Pressure Injury POA: NA Measurement:see operative note Wound bed: noted to be red with yellow slough by nursing  Drainage (amount, consistency, odor) nursing noted purulent drainage  Surgical site has had OpSite visible dressing in place was changed by staff yesterday.   Last seen by neurosurgery 09/05/22. Due to the fact this is a new surgical site I have requested consulting MD to request neurosurgeon to assess wound for acute post op complications.   If needed once neurosurgery evaluates the patient they can re-consult for wound care recommendations  Discussed POC with hospitalist and bedside nurse.  Re consult if needed, will not follow at this time. Thanks  Koury Roddy [employer], RN,CWOCN, CNS, CWON-AP [phone])

## 2022-09-16 NOTE — Progress Notes (Signed)
Drsg chg complete. Instructed nurse not to use PICC at this time. Requested nurse to place order for chest x-ray for tip picc placement verification before starting infusions. Tomasita Morrow, RN VAST

## 2022-09-16 NOTE — Progress Notes (Signed)
Occupational Therapy Treatment Patient Details Name: David [name] MRN: [id number] DOB: [birth year] Today's Date: 09/16/2022   History of present illness Pt is a 62 y.o. male who presented 08/20/22  with hypoglycemia. 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 5/28 surgical repair: T5-11 posterior/posterolateral arthrodesis  & Transpedicular decompression with partial corpectomy, left T8 & T8-9 laminectomy, bilateral facetectomy. 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 Without IV connected, pt able to don TLSO brace without assistance today. Focused on standing balance/tolerance at sink in [place]. Pt able to pull to stand with min guard in [place] though notably reliant on at least one UE support during tasks within [place]. Without UE support for bimanual tasks, up to Mod A needed to maintain balance in [place].    Recommendations for follow up therapy are one component of a multi-disciplinary discharge planning process, led by the attending physician.  Recommendations may be updated based on patient status, additional functional criteria and insurance authorization.    Assistance Recommended at Discharge Frequent or constant Supervision/Assistance  Patient can return home with the following  Two people to help with walking and/or transfers;Two people to help with bathing/dressing/bathroom   Equipment Recommendations  BSC/3in1;Wheelchair (measurements OT);Wheelchair cushion (measurements OT)    Recommendations for Other Services Rehab consult    Precautions / Restrictions Precautions Precautions:  Fall;Back Required Braces or Orthoses: Spinal Brace Spinal Brace: Thoracolumbosacral orthotic;Applied in sitting position Restrictions Weight Bearing Restrictions: Yes RLE Weight Bearing: Weight bearing as tolerated       Mobility Bed Mobility Overal bed mobility: Needs Assistance Bed Mobility: Supine to Sit     Supine to sit: Supervision, HOB elevated     General bed mobility comments: use of bedrail, increased time    Transfers Overall transfer level: Needs assistance Equipment used: Ambulation equipment used Transfers: Sit to/from Stand Sit to Stand: Min guard, +2 safety/equipment           General transfer comment: able to pull self into [place] from bed and at sink easily with min guard +2 for safety     Balance Overall balance assessment: Needs assistance Sitting-balance support: Bilateral upper extremity supported, Feet supported, No upper extremity supported Sitting balance-Leahy Scale: Fair     Standing balance support: Bilateral upper extremity supported, During functional activity Standing balance-Leahy Scale: Poor                             ADL either performed or assessed with clinical judgement   ADL Overall ADL's : Needs assistance/impaired     Grooming: Minimal assistance;Moderate assistance;Standing;Oral care;Wash/dry face Grooming Details (indicate cue type and reason): standing at sink in [place]; min guard with one UE support on sink but without UE support, up to Mod A needed with imbalance noted d/t BLE proprioception deficits         Upper Body Dressing : Set up;Sitting Upper Body Dressing Details (indicate cue type and reason): able to don TLSO brace with Setup without IV connected  General ADL Comments: [place] at sink to focus on WB though pt reporting increased pain/fatigue and not having a good day so only tolerated activity up to 7 min at sink    Extremity/Trunk Assessment Upper Extremity  Assessment Upper Extremity Assessment: Overall WFL for tasks assessed   Lower Extremity Assessment Lower Extremity Assessment: Defer to PT evaluation        Vision   Vision Assessment?: No apparent visual deficits   Perception     Praxis      Cognition Arousal/Alertness: Awake/alert Behavior During Therapy: WFL for tasks assessed/performed Overall Cognitive Status: No family/caregiver present to determine baseline cognitive functioning Area of Impairment: Memory, Problem solving, Attention, Following commands, Awareness, Safety/judgement                   Current Attention Level: Selective Memory: Decreased short-term memory Following Commands: Follows multi-step commands with increased time Safety/Judgement: Decreased awareness of deficits, Decreased awareness of safety Awareness: Emergent, Anticipatory Problem Solving: Slow processing, Requires verbal cues General Comments: decreased insight into safety and can be impulsive at times; easily redirectable        Exercises      Shoulder Instructions       General Comments      Pertinent Vitals/ Pain       Pain Assessment Pain Assessment: Faces Faces Pain Scale: Hurts even more Pain Location: lower part of incision Pain Descriptors / Indicators: Discomfort, Grimacing, Guarding Pain Intervention(s): Monitored during session  Home Living                                          Prior Functioning/Environment              Frequency  Min 2X/week        Progress Toward Goals  OT Goals(current goals can now be found in the care plan section)  Progress towards OT goals: Progressing toward goals  Acute Rehab OT Goals Patient Stated Goal: pain control, be able to walk out of here OT Goal Formulation: With patient Time For Goal Achievement: 09/26/22 Potential to Achieve Goals: Fair  Plan Discharge plan remains appropriate    Co-evaluation                 AM-PAC OT "6  Clicks" Daily Activity     Outcome Measure   Help from another person eating meals?: None Help from another person taking care of personal grooming?: A Little Help from another person toileting, which includes using toliet, bedpan, or urinal?: Total Help from another person bathing (including washing, rinsing, drying)?: Total Help from another person to put on and taking off regular upper body clothing?: A Lot Help from another person to put on and taking off regular lower body clothing?: A Little 6 Click Score: 14    End of Session Equipment Utilized During Treatment: Gait belt;Back brace  OT Visit Diagnosis: Unsteadiness on feet (R26.81);Other abnormalities of gait and mobility (R26.89);Muscle weakness (generalized) (M62.81);Pain   Activity Tolerance Patient tolerated treatment well   Patient Left in chair;with call bell/phone within reach;with chair alarm set   Nurse Communication Mobility status;Patient requests pain meds        Time: 1327-1400 OT Time Calculation (min): 33 min  Charges: OT General Charges $OT Visit: 1 Visit OT Treatments $Self Care/Home Management : 23-37 mins  Bradd Canary, OTR/L Acute Rehab Services Office: [phone]  Lorre Munroe 09/16/2022, 2:19 PM

## 2022-09-16 NOTE — Progress Notes (Signed)
Peripherally Inserted Central Catheter Exchange  The IV Nurse has discussed with the patient and/or persons authorized to consent for the patient, the purpose of this procedure and the potential benefits and risks involved with this procedure.  The benefits include less needle sticks, lab draws from the catheter, and the patient may be discharged home with the catheter. Risks include, but not limited to, infection, bleeding, blood clot (thrombus formation), and puncture of an artery; nerve damage and irregular heartbeat and possibility to perform a PICC exchange if needed/ordered by physician.  Alternatives to this procedure were also discussed.  Bard Power PICC patient education guide, fact sheet on infection prevention and patient information card has been provided to patient /or left at bedside.    PICC Placement Documentation  PICC Single Lumen 09/16/22 Right Brachial 38 cm 0 cm (Active)  Indication for Insertion or Continuance of Line Prolonged intravenous therapies 09/16/22 2140  Exposed Catheter (cm) 0 cm 09/16/22 2140  Site Assessment Clean, Dry, Intact 09/16/22 2140  Line Status Blood return noted;Flushed;Saline locked 09/16/22 2140  Dressing Type Transparent;Securing device 09/16/22 2140  Dressing Status Clean, Dry, Intact;Antimicrobial disc in place 09/16/22 2140  Safety Lock Not Applicable 09/16/22 2140  Line Care Connections checked and tightened 09/16/22 2140  Line Adjustment (NICU/IV Team Only) No 09/16/22 2140  Dressing Intervention New dressing 09/16/22 2140  Dressing Change Due 09/23/22 09/16/22 2140       Christeen Douglas 09/16/2022, 9:59 PM

## 2022-09-17 DIAGNOSIS — E162 Hypoglycemia, unspecified: Secondary | ICD-10-CM | POA: Diagnosis not present

## 2022-09-17 LAB — BASIC METABOLIC PANEL
Anion gap: 9 (ref 5–15)
BUN: 15 mg/dL (ref 6–20)
CO2: 22 mmol/L (ref 22–32)
Calcium: 8.6 mg/dL — ABNORMAL LOW (ref 8.9–10.3)
Chloride: 98 mmol/L (ref 98–111)
Creatinine, Ser: 1.36 mg/dL — ABNORMAL HIGH (ref 0.61–1.24)
GFR, Estimated: 60 mL/min — ABNORMAL LOW (ref >60)
Glucose, Bld: 120 mg/dL — ABNORMAL HIGH (ref 70–99)
Potassium: 4.5 mmol/L (ref 3.5–5.1)
Sodium: 129 mmol/L — ABNORMAL LOW (ref 135–145)

## 2022-09-17 LAB — CBC
HCT: 27.1 % — ABNORMAL LOW (ref 39.0–52.0)
Hemoglobin: 8.6 g/dL — ABNORMAL LOW (ref 13.0–17.0)
MCH: 29 pg (ref 26.0–34.0)
MCHC: 31.7 g/dL (ref 30.0–36.0)
MCV: 91.2 fL (ref 80.0–100.0)
Platelets: 230 10*3/uL (ref 150–400)
RBC: 2.97 MIL/uL — ABNORMAL LOW (ref 4.22–5.81)
RDW: 19.5 % — ABNORMAL HIGH (ref 11.5–15.5)
WBC: 8.8 10*3/uL (ref 4.0–10.5)
nRBC: 0 % (ref 0.0–0.2)

## 2022-09-17 LAB — GLUCOSE, CAPILLARY
Glucose-Capillary: 129 mg/dL — ABNORMAL HIGH (ref 70–99)
Glucose-Capillary: 151 mg/dL — ABNORMAL HIGH (ref 70–99)
Glucose-Capillary: 113 mg/dL — ABNORMAL HIGH (ref 70–99)
Glucose-Capillary: 157 mg/dL — ABNORMAL HIGH (ref 70–99)

## 2022-09-17 MED ORDER — TRAZODONE HCL 50 MG PO TABS
50.0000 mg | ORAL_TABLET | ORAL | Status: AC
  Administered 2022-09-17: 50 mg via ORAL
  Filled 2022-09-17: qty 1

## 2022-09-17 NOTE — Hospital Course (Signed)
   Disseminated MRSA bacteremia with T7-T9 discitis and osteomyelitis Hospitalized for disseminated MRSA bacteremia from 4/9-5/17, and discharged on p.o. doxycycline. Pt admitted again on 5/18 for hypoglycemia. Marland Kitchen MRI thoracic spine concerning for progressive discitis/osteomyelitis at T7-T9, extensive phlegmonous change in the paravertebral soft tissues, partial collapse of T8 vertebral body with 40% vertebral body height loss and approximately 6 mm bony retropulsion, moderate canal stenosis and mild cord edema. Underwent surgical intervention 5/29 per neurosurgery, JP drain subsequently removed.Neurosurgery and ID consulted. Patient underwent surgical decompression by neurosurgery. Patient seen by ID and neurosurgery during this hospitalization. -Continue Daptomycin x 28 doses from 08/31/22 per ID -Palliative care following and helping manage patient's pain. -Continue Robaxin 750 mg every 6 hours as needed, Toradol 50 mg IV every 8 hours as needed, Lyrica 75 mg nightly, Lidoderm patch, celecoxib 100 mg twice daily, BuSpar 15 mg twice daily, fentanyl patch 50 mcg every 72 hours, oxycodone 10 mg every 4 hours as needed. -Continue bowel regimen.   Right foot osteomyelitis status post right fifth ray amputation 4/30 -Continue current antibiotic regimen. -WBAT. -Will need outpatient follow-up with orthopedics.   Chronic systolic CHF -Restart oral Lasix.   Hypomagnesemia/hypokalemia -Repleted.    Hypertension -Continue Coreg.      Acute metabolic encephalopathy Mental status has improved. Likely secondary to pain meds versus hyponatremia versus withdrawal.-CT head and MRI brainwithout any acute abnormalities.    Persistent A-fib -Continue Coreg for rate control.  -Eliquis  was held for 1 week and resumed 09/08/2022.    Moderate pleural effusion Stable on CT abdomen and pelvis on 09/14/2022 -Continue Lasix   Constipation Improved - Continue laxatives as needed.   Hypotension Resolved.    Postop acute blood loss anemia Likely loss postoperatively. Started posttransfusion 1 unit PRBCs hemoglobin currently stable at 8.7. -Follow H&H. -Transfusion threshold hemoglobin < 7.   AKI Resolved. Felt likely secondary to prerenal azotemia secondary to hypotension and dehydration.     Hyponatremia -??  Etiology. -Different show includes secondary to volume overload versus poor oral intake. -Urine sodium less than 10, TSH within normal limits, serum osmolality 287, serum cortisol is 12. Lasix was held and he was initially treated with IV fluids.    Diabetes mellitus type 2 -Hemoglobin A1c 9.1 (07/13/2022). -Continue Semglee 20 units daily. -Continue SSI.   Left-sided abdominal pain with swelling CT abdomen pelvis with contrast on 09/04/2022 reviewed.  No acute abnormality noted but there was evidence of small bilateral pleural effusions, bilateral lower lobe atelectasis or infiltrates, diffuse CAD, aortic atherosclerosis, left lower pole nonobstructing nephrolithiasis and anasarca throughout subcutaneous soft tissues. Lasix has been reordered.

## 2022-09-17 NOTE — Progress Notes (Signed)
PROGRESS NOTE   David [name]  ZOX:[id number] DOB: 10-24-1960 DOA: 08/20/2022 PCP: [name], MD   Date of Service: the patient was seen and examined on 09/17/2022  Brief Narrative:  David [name] is a 62 y.o. 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 Chestine Spore on 4/25 persistent A-fib on Eliquis, DM-2, seizure disorder, HTN, HLD and chronic pain return to ED with hypoglycemia to 38. Patient was hospitalized for disseminated MRSA bacteremia from 4/9-5/17, and discharged on p.o. doxycycline. Patient was hypoglycemic to 38 when EMS arrived. His hypoglycemia persisted despite intervention in ED and leading to his rehospitalization. He also have significant back pain. MRI thoracic spine concerning for progressive discitis/osteomyelitis at T7-T9, extensive phlegmonous change in the paravertebral soft tissues, partial collapse of T8 vertebral body with 40% vertebral body height loss and approximately 6 mm bony retropulsion, moderate canal stenosis and mild cord edema. Neurosurgery and ID consulted. Patient underwent surgical decompression by neurosurgery. Palliative medicine helping with pain management. Therapy recommended CIR, unfortunately, pt does not have caregiver support, hence SNF is now recommended. TOC on board and assisting with disposition    Assessment and Plan:  Disseminated MRSA bacteremia with T7-T9 discitis and osteomyelitis Hospitalized for disseminated MRSA bacteremia from 4/9-5/17, and discharged on p.o. doxycycline. Pt admitted again on 5/18 for hypoglycemia. MRI thoracic spine concerning for progressive discitis/osteomyelitis at T7-T9, extensive phlegmonous change in the paravertebral soft tissues, partial collapse of T8 vertebral body with 40% vertebral body height loss and approximately 6 mm bony retropulsion, moderate canal stenosis and mild cord edema. Underwent surgical  intervention 5/29 per neurosurgery, JP drain subsequently removed.  -ID will see patient tomorrow  -Continue Daptomycin x 28 doses from 08/31/22 per ID -Palliative care following and helping manage patient's pain. -Continue Robaxin 750 mg every 6 hours as needed, Toradol 50 mg IV every 8 hours as needed, Lyrica 75 mg nightly, Lidoderm patch, celecoxib 100 mg twice daily, BuSpar 15 mg twice daily, fentanyl patch 50 mcg every 72 hours, oxycodone 10 mg every 4 hours as needed. -Continue bowel regimen.   Right foot osteomyelitis status post right fifth ray amputation 4/30 -Continue current antibiotic regimen. -WBAT. -Will need outpatient follow-up with orthopedics.   Chronic systolic CHF -Continue oral Lasix.   Hypomagnesemia/hypokalemia -Replete as necessary   Hypertension -Continue Coreg.     Acute metabolic encephalopathy Likely secondary to pain meds versus hyponatremia versus withdrawal.   Persistent A-fib -Continue Coreg for rate control.  -Continue Eliquis   Moderate pleural effusion Stable on CT abdomen and pelvis on 09/14/2022 -Continue Lasix   Constipation Improved -Continue laxatives as needed.   Hypotension Resolved.   Postop acute blood loss anemia Hb 8.6 today, baseline is 8-10. Likely loss postoperatively. Started posttransfusion 1 unit PRBCs  -Continue to monitor with CBC -Transfusion threshold hemoglobin < 7.   AKI Resolved. Felt likely secondary to prerenal azotemia secondary to hypotension and dehydration.   Hyponatremia Na 129, baseline 129-133 ??  Etiology. -Different show includes secondary to volume overload versus poor oral intake. -Urine sodium less than 10, TSH within normal limits, serum osmolality 287, serum cortisol is 12. -Continue to monitor with BMP     Diabetes mellitus type 2 CBGs 113-151, Hemoglobin A1c 9.1 (07/13/2022). -Continue Semglee 20 units daily. -Continue SSI.   Left-sided abdominal pain with swelling CT abdomen pelvis  with contrast on 09/04/2022 reviewed.  No acute abnormality noted but there was evidence  of small bilateral pleural effusions, bilateral lower lobe atelectasis or infiltrates, diffuse CAD, aortic atherosclerosis, left lower pole nonobstructing nephrolithiasis and anasarca throughout subcutaneous soft tissues. -Continue Lasix   Subjective:  Pt tearful this morning regarding his condition.  Physical Exam:  Vitals:   09/17/22 0407 09/17/22 0841 09/17/22 1146 09/17/22 1609  BP: 122/87 111/85 130/82 (!) 141/83  Pulse: 61 69 (!) 54 (!) 105  Resp: 19 18 (!) 21 19  Temp: 98.4 F (36.9 C) 98.2 F (36.8 C) 98.1 F (36.7 C) 98.2 F (36.8 C)  TempSrc: Oral Oral Oral Oral  SpO2: 93% 91% 98% 92%  Weight:      Height:        General: Alert, tearful at times Cardio: Normal S1 and S2, RRR, no r/m/g Pulm: CTAB, normal work of breathing Abdomen: Bowel sounds normal. Abdomen soft and non-tender.  Extremities: No peripheral edema.  Neuro: Cranial nerves grossly intact   Data Reviewed:  I have personally reviewed and interpreted labs, imaging.   CBC: Recent Labs  Lab 09/14/22 0330 09/17/22 0326  WBC 10.1 8.8  HGB 9.3* 8.6*  HCT 29.1* 27.1*  MCV 93.0 91.2  PLT 260 230   Basic Metabolic Panel: Recent Labs  Lab 09/11/22 0600 09/12/22 0503 09/13/22 0434 09/14/22 0330 09/17/22 0326  NA 128* 130* 131* 130* 129*  K 4.3 4.8 5.1 4.3 4.5  CL 98 98 101 103 98  CO2 23 23 22  20* 22  GLUCOSE 97 153* 131* 111* 120*  BUN 12 12 11 10 15   CREATININE 1.15 1.21 1.06 1.08 1.36*  CALCIUM 8.8* 8.6* 7.6* 8.7* 8.6*  MG 1.9 1.9  --  1.8  --    GFR: Estimated Creatinine Clearance: 63.4 mL/min (A) (by C-G formula based on SCr of 1.36 mg/dL (H)). Liver Function Tests: No results for input(s): "AST", "ALT", "ALKPHOS", "BILITOT", "PROT", "ALBUMIN" in the last 168 hours.  Coagulation Profile: No results for input(s): "INR", "PROTIME" in the last 168 hours.    Code Status:  Full code.  Code status  decision has been confirmed with: patient  Family Communication:     Severity of Illness:  The appropriate patient status for this patient is INPATIENT. Inpatient status is judged to be reasonable and necessary in order to provide the required intensity of service to ensure the patient's safety. The patient's presenting symptoms, physical exam findings, and initial radiographic and laboratory data in the context of their chronic comorbidities is felt to place them at high risk for further clinical deterioration. Furthermore, it is not anticipated that the patient will be medically stable for discharge from the hospital within 2 midnights of admission.   * I certify that at the point of admission it is my clinical judgment that the patient will require inpatient hospital care spanning beyond 2 midnights from the point of admission due to high intensity of service, high risk for further deterioration and high frequency of surveillance required.*   Rolm Gala MD  09/17/2022 5:14 PM

## 2022-09-18 DIAGNOSIS — M869 Osteomyelitis, unspecified: Secondary | ICD-10-CM | POA: Diagnosis not present

## 2022-09-18 DIAGNOSIS — M4624 Osteomyelitis of vertebra, thoracic region: Secondary | ICD-10-CM | POA: Diagnosis not present

## 2022-09-18 DIAGNOSIS — M4644 Discitis, unspecified, thoracic region: Secondary | ICD-10-CM | POA: Diagnosis not present

## 2022-09-18 DIAGNOSIS — Z8614 Personal history of Methicillin resistant Staphylococcus aureus infection: Secondary | ICD-10-CM

## 2022-09-18 LAB — BASIC METABOLIC PANEL
Anion gap: 10 (ref 5–15)
BUN: 14 mg/dL (ref 6–20)
CO2: 22 mmol/L (ref 22–32)
Calcium: 8.5 mg/dL — ABNORMAL LOW (ref 8.9–10.3)
Chloride: 98 mmol/L (ref 98–111)
Creatinine, Ser: 1.16 mg/dL (ref 0.61–1.24)
GFR, Estimated: >60 mL/min (ref >60)
Glucose, Bld: 140 mg/dL — ABNORMAL HIGH (ref 70–99)
Potassium: 4.6 mmol/L (ref 3.5–5.1)
Sodium: 130 mmol/L — ABNORMAL LOW (ref 135–145)

## 2022-09-18 LAB — GLUCOSE, CAPILLARY
Glucose-Capillary: 149 mg/dL — ABNORMAL HIGH (ref 70–99)
Glucose-Capillary: 150 mg/dL — ABNORMAL HIGH (ref 70–99)
Glucose-Capillary: 128 mg/dL — ABNORMAL HIGH (ref 70–99)
Glucose-Capillary: 121 mg/dL — ABNORMAL HIGH (ref 70–99)

## 2022-09-18 LAB — CBC
HCT: 28.1 % — ABNORMAL LOW (ref 39.0–52.0)
Hemoglobin: 8.9 g/dL — ABNORMAL LOW (ref 13.0–17.0)
MCH: 28.9 pg (ref 26.0–34.0)
MCHC: 31.7 g/dL (ref 30.0–36.0)
MCV: 91.2 fL (ref 80.0–100.0)
Platelets: 228 10*3/uL (ref 150–400)
RBC: 3.08 MIL/uL — ABNORMAL LOW (ref 4.22–5.81)
RDW: 19.5 % — ABNORMAL HIGH (ref 11.5–15.5)
WBC: 8.2 10*3/uL (ref 4.0–10.5)
nRBC: 0 % (ref 0.0–0.2)

## 2022-09-18 NOTE — Progress Notes (Signed)
[facility]  Date of Admission:  08/20/2022       Abx: 5/21-c vanc 5/9-c doxy   Dapto 4/13-14, 4/24-22, 5/9-15 Vanc 4/9-11, 4/23-5/8 Oritavancin 4/15    ASSESSMENT/recommendation: 62 year old male chf, afib on eliquis, dm2, recent mrsa bacteremia in setting OM of the right foot (5th ray) and seeding of tspine (dicitis, multilevel loculated prevertebral abscess, right pleural empyema) with om/discitis on doxy tail end treatment, admitted with opiate overdose and repeat imaging this admission showed progressive destructive findings of spine   #Recent history of MRSA bacteremia likely from 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  He stated that he did not time that well.  5/18 CTA chest on arrival showed proximal aggressive discitis/osteomyelitis.  Recommended MRI T spine for further characterization. -Patient has completed above 5 weeks antibiotics of vancomycin and then Dapto and transition to doxycycline alone on 5/15. - Repeat thoracic spine MRI on 5/20 showed progressive discitis/osteomyelitis at T7-T9, partial collapse of vertebral body 40% body with height loss at T8, 10 ventral epidural phlegmon T7-T9, extensive phlegmonous changes the paravertebral soft tissue additionally involve the mid thoracic levels.  Bilateral pleural effusions complex on the right. -- 5/28 s/p extensive thoracic I&D, decompression, spinal stabilization hardware as mentioned below in subjective section. Cx mrsa -- given hardware and staph aureus infection, will need indefinite antibiotics therapy  ------------- 6/16 id assessment Bilateral strength better since surgery but LLE weaker than right LE On fentanyl, oxycodone, flexeril among others for pain management and seems adequately  controlled. Palliative care following  Unclear disposition yet cir vs snf     Recommendations:  -continue daptomycin until 6/25; then transition to oral doxycycline 100 mg po bid indefinitely --please reengage [place] near discharge  --appreciate palliative care helping with pain management. Seems he is on quite a bit of opioid's among others - weekly cbc, cmp, cpk (on dapto), and crp, sed rate - id clinic follow up with dr Daiva Eves on 6/24 @ 345 pm (let us know if he is still here and we can update appointment - discussed with primary team        Principal Problem:   Hypoglycemia Active Problems:   Essential hypertension   Insulin dependent type 2 diabetes mellitus (HCC)   Persistent atrial fibrillation (HCC)   Pure hypercholesterolemia   Cellulitis in diabetic foot (HCC)   Diskitis   Hypokalemia   Hypotension   Constipation   Acute metabolic encephalopathy   Acute postoperative anemia due to expected blood loss   No Known Allergies  Scheduled Meds:  acetaminophen  1,000 mg Oral TID   apixaban  5 mg Oral BID   atorvastatin  80 mg Oral Daily   busPIRone  15 mg Oral BID   carvedilol  3.125 mg Oral BID   celecoxib  100 mg Oral BID   Chlorhexidine Gluconate Cloth  6 each Topical Daily   digoxin  125 mcg Oral Daily   [START ON 09/28/2022] doxycycline  100 mg Oral Q12H   DULoxetine  20 mg Oral Daily   famotidine  20 mg Oral BID   feeding supplement  237 mL Oral BID BM   fentaNYL  1 patch Transdermal Q48H   fluticasone  1 spray Each Nare Daily   furosemide  20 mg Oral Daily  insulin aspart  0-5 Units Subcutaneous QHS   insulin aspart  0-9 Units Subcutaneous TID WC   insulin glargine-yfgn  20 Units Subcutaneous Daily   lidocaine  1 patch Transdermal Q24H   magnesium oxide  400 mg Oral BID   pantoprazole  40 mg Oral Daily   polyethylene glycol  17 g Oral BID   pregabalin  75 mg Oral QHS   senna-docusate  1 tablet Oral BID   sodium chloride flush  10-40 mL  Intracatheter Q12H   sodium chloride flush  3 mL Intravenous Q12H   Continuous Infusions:  sodium chloride Stopped (09/01/22 1621)   sodium chloride Stopped (09/15/22 1353)   DAPTOmycin (CUBICIN) 650 mg in sodium chloride 0.9 % IVPB 650 mg (09/17/22 1346)   PRN Meds:.sodium chloride, bisacodyl, fentaNYL (SUBLIMAZE) injection, labetalol, methocarbamol, ondansetron **OR** ondansetron (ZOFRAN) IV, mouth rinse, oxyCODONE, sodium chloride, sodium chloride flush, sodium chloride flush, sodium phosphate   SUBJECTIVE: Doing pt ot every day Legs strength better No n/v/diarrhea/rash/chest pain/cough  No picc  Snf vs cir   Review of Systems: ROS All other ROS was negative, except mentioned above     OBJECTIVE: Vitals:   09/17/22 2036 09/18/22 0036 09/18/22 0500 09/18/22 0813  BP: 133/81 (!) 143/91 135/84 133/80  Pulse: 68 95 79 64  Resp: 18 18 16 18   Temp: 97.8 F (36.6 C) 98.1 F (36.7 C) 98.4 F (36.9 C) 98.2 F (36.8 C)  TempSrc: Oral Axillary Axillary Oral  SpO2: 94% 93% 93% 97%  Weight:      Height:       Body mass index is 24.91 kg/m.  Physical Exam General/constitutional: no distress, pleasant HEENT: Normocephalic, PER, Conj Clear, EOMI, Oropharynx clear Neck supple CV: rrr no mrg Lungs: clear to auscultation, normal respiratory effort Abd: Soft, Nontender Ext: no edema Skin: No Rash Neuro: nonfocal LLE strength proximally less than rle strength MSK: no peripheral joint swelling/tenderness/warmth; back spines nontender    Lab Results Lab Results  Component Value Date   WBC 8.2 09/18/2022   HGB 8.9 (L) 09/18/2022   HCT 28.1 (L) 09/18/2022   MCV 91.2 09/18/2022   PLT 228 09/18/2022    Lab Results  Component Value Date   CREATININE 1.16 09/18/2022   BUN 14 09/18/2022   NA 130 (L) 09/18/2022   K 4.6 09/18/2022   CL 98 09/18/2022   CO2 22 09/18/2022    Lab Results  Component Value Date   ALT 13 08/21/2022   AST 11 (L) 08/21/2022   ALKPHOS 82  08/21/2022   BILITOT 0.6 08/21/2022      Microbiology: No results found for this or any previous visit (from the past 240 hour(s)).    Serology:   Imaging: If present, new imagings (plain films, ct scans, and mri) have been personally visualized and interpreted; radiology reports have been reviewed. Decision making incorporated into the Impression / Recommendations.   5/20 mr thoracic spine 1. Findings of ongoing, progressive discitis-osteomyelitis at the T7-8 and T8-9 levels. Partial collapse of the T8 vertebral body with 40% vertebral body height loss and approximately 6 mm of bony retropulsion at the inferior endplate of T8. 2. Mild collapse of the superior endplate of T9 with approximately 25% vertebral body height loss. 3. Moderate canal stenosis at the T8-9 level secondary to retropulsed T8 vertebral body. There may be mild cord edema at the T8-9 level, evaluation is somewhat degraded by motion artifact on the axial sequences. 4. Thin ventral epidural phlegmon at the  T7 to T9 level. 5. Extensive phlegmonous changes in the paravertebral soft tissues adjacent to the involved midthoracic levels. No organized epidural abscess. 6. Bilateral pleural effusions, complex on the right, better characterized by same day CT chest.    5/20 Ct angio chest 1. No filling defect is identified in the pulmonary arterial tree to suggest pulmonary embolus. 2. Discitis-osteomyelitis at T7-T8-T9, with progressive bony destructive findings, including substantial collapse of T8 and 8 mm of posterior bony retropulsion narrowing the AP diameter of the thecal sac down to 0.4 cm, compatible with prominent central narrowing of the thecal sac. 3. Small left and small to moderate right pleural effusion with some loculation of the right pleural effusion along the major fissure. Passive atelectasis noted in addition to scattered mild atelectasis in the lungs. 4. Mild cardiomegaly. Faint mitral  valve calcification. 5. Coronary, aortic arch, and branch vessel atherosclerotic vascular disease. 6. Chronic endplate compressions at T3, no change from 07/12/2022. 7. Aortic and coronary atherosclerosis.  Raymondo Band, MD [facility] [phone] pager    09/18/2022, 10:06 AM

## 2022-09-18 NOTE — Progress Notes (Signed)
PROGRESS NOTE   David [name]  ZOX:[id number] DOB: 07/23/1960 DOA: 08/20/2022 PCP: [name], MD   Date of Service: the patient was seen and examined on 09/18/2022  Brief Narrative:  David [name] is a 62 y.o. 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 Chestine Spore on 4/25 persistent A-fib on Eliquis, DM-2, seizure disorder, HTN, HLD and chronic pain return to ED with hypoglycemia  and excess opiate intake.  Of note pt was hospitalized for disseminated MRSA bacteremia from 4/9-5/17, completed 5 weeks of vancyomycin,then daptomycin. He was discharged on p.o. doxycycline. Pt admitted again on 5/18 for hypoglycemia MRI thoracic spine at this time concerning for progressive discitis/osteomyelitis at T7-T9, extensive phlegmonous change in the paravertebral soft tissues, partial collapse of T8 vertebral body with 40% vertebral body height loss and approximately 6 mm bony retropulsion, moderate canal stenosis and mild cord edema. Underwent surgical intervention with extensive thoracic I&D, decompression, spinal stabilization hardware 5/29 with neurosurgery. Given hardware and staph aureus infection, will need indefinite antibiotics therapy    Assessment and Plan:  Disseminated MRSA bacteremia with T7-T9 discitis and osteomyelitis -ID recs:      -Continue daptomycin until 6/25; then transition to oral doxycycline 100 mg         po bid indefinitely.       -Re-engage with ID 48 hr before d/c.       -Weekly cbc, cmp, cpk (on dapto), and crp, sed rate.       -OP follow up with Dr Daiva Eves 6/24 at 3:45pm. -Palliative care following and helping manage patient's pain. -Continue Robaxin 750 mg every 6 hours as needed, Toradol 50 mg IV every 8 hours as needed, Lyrica 75 mg nightly, Lidoderm patch, celecoxib 100 mg twice daily, BuSpar 15 mg twice daily, fentanyl patch 50 mcg every 72 hours, oxycodone 10 mg  every 4 hours as needed. -Continue bowel regimen.   Right foot osteomyelitis status post right fifth ray amputation 4/30 -Continue current antibiotic regimen. -WBAT -Will need outpatient follow-up with orthopedics.   Chronic systolic CHF -Continue oral Lasix.   Hypomagnesemia/hypokalemia -Replete as necessary   Hypertension -Continue Coreg.     Acute metabolic encephalopathy Likely secondary to pain meds versus hyponatremia versus withdrawal.   Persistent A-fib -Continue Coreg for rate control.  -Continue Eliquis   Moderate pleural effusion Stable on CT abdomen and pelvis on 09/14/2022 -Continue Lasix   Constipation Improved -Continue laxatives as needed.   Hypotension Resolved.   Postop acute blood loss anemia Hb 8.9 today, baseline is 8-10. Likely loss postoperatively. Started posttransfusion 1 unit PRBCs  -Continue to monitor with CBC -Transfusion threshold hemoglobin < 7.   AKI Resolved. Felt likely secondary to prerenal azotemia secondary to hypotension and dehydration.   Hyponatremia Na 130, baseline 129-133 ??  Etiology. -Different show includes secondary to volume overload versus poor oral intake. -Urine sodium less than 10, TSH within normal limits, serum osmolality 287, serum cortisol is 12. -Continue to monitor with BMP     Diabetes mellitus type 2 CBGs 149, Hemoglobin A1c 9.1 (07/13/2022). -Continue Semglee 20 units daily. -Continue SSI.   Left-sided abdominal pain with swelling CT abdomen pelvis with contrast on 09/04/2022 reviewed.  No acute abnormality noted but there was evidence of small bilateral pleural effusions, bilateral lower lobe atelectasis or infiltrates, diffuse CAD, aortic atherosclerosis, left lower pole nonobstructing nephrolithiasis and anasarca throughout subcutaneous soft tissues. -Continue Lasix  Subjective: Patient in overall better spirits today, however expressed disapproval of the order of his pain meds given. Friend at  bedside.  Physical Exam:  Vitals:   09/17/22 2036 09/18/22 0036 09/18/22 0500 09/18/22 0813  BP: 133/81 (!) 143/91 135/84 133/80  Pulse: 68 95 79 64  Resp: 18 18 16 18   Temp: 97.8 F (36.6 C) 98.1 F (36.7 C) 98.4 F (36.9 C) 98.2 F (36.8 C)  TempSrc: Oral Axillary Axillary Oral  SpO2: 94% 93% 93% 97%  Weight:      Height:        General: Alert, tearful at times Cardio: Normal S1 and S2, RRR, no r/m/g Pulm: CTAB, normal work of breathing Abdomen: Bowel sounds normal. Abdomen soft and non-tender.  Extremities: No peripheral edema.  Neuro: Cranial nerves grossly intact   Data Reviewed:  I have personally reviewed and interpreted labs, imaging.   CBC: Recent Labs  Lab 09/14/22 0330 09/17/22 0326 09/18/22 0411  WBC 10.1 8.8 8.2  HGB 9.3* 8.6* 8.9*  HCT 29.1* 27.1* 28.1*  MCV 93.0 91.2 91.2  PLT 260 230 228    Basic Metabolic Panel: Recent Labs  Lab 09/12/22 0503 09/13/22 0434 09/14/22 0330 09/17/22 0326 09/18/22 0411  NA 130* 131* 130* 129* 130*  K 4.8 5.1 4.3 4.5 4.6  CL 98 101 103 98 98  CO2 23 22 20* 22 22  GLUCOSE 153* 131* 111* 120* 140*  BUN 12 11 10 15 14   CREATININE 1.21 1.06 1.08 1.36* 1.16  CALCIUM 8.6* 7.6* 8.7* 8.6* 8.5*  MG 1.9  --  1.8  --   --     GFR: Estimated Creatinine Clearance: 74.3 mL/min (by C-G formula based on SCr of 1.16 mg/dL). Liver Function Tests: No results for input(s): "AST", "ALT", "ALKPHOS", "BILITOT", "PROT", "ALBUMIN" in the last 168 hours.  Coagulation Profile: No results for input(s): "INR", "PROTIME" in the last 168 hours.    Code Status:  Full code.  Code status decision has been confirmed with: patient  Family Communication:     Severity of Illness:  The appropriate patient status for this patient is INPATIENT. Inpatient status is judged to be reasonable and necessary in order to provide the required intensity of service to ensure the patient's safety. The patient's presenting symptoms, physical exam  findings, and initial radiographic and laboratory data in the context of their chronic comorbidities is felt to place them at high risk for further clinical deterioration. Furthermore, it is not anticipated that the patient will be medically stable for discharge from the hospital within 2 midnights of admission.   * I certify that at the point of admission it is my clinical judgment that the patient will require inpatient hospital care spanning beyond 2 midnights from the point of admission due to high intensity of service, high risk for further deterioration and high frequency of surveillance required.*   Rolm Gala MD  09/18/2022 9:11 AM

## 2022-09-18 NOTE — Plan of Care (Signed)
Problem: Education: Goal: Understanding of CV disease, CV risk reduction, and recovery process will improve Outcome: Progressing Goal: Individualized Educational Video(s) Outcome: Progressing   Problem: Activity: Goal: Ability to return to baseline activity level will improve Outcome: Progressing   Problem: Cardiovascular: Goal: Ability to achieve and maintain adequate cardiovascular perfusion will improve Outcome: Progressing Goal: Vascular access site(s) Level 0-1 will be maintained Outcome: Progressing   Problem: Health Behavior/Discharge Planning: Goal: Ability to safely manage health-related needs after discharge will improve Outcome: Progressing   Problem: Education: Goal: Knowledge of General Education information will improve Description: Including pain rating scale, medication(s)/side effects and non-pharmacologic comfort measures Outcome: Progressing   Problem: Health Behavior/Discharge Planning: Goal: Ability to manage health-related needs will improve Outcome: Progressing   Problem: Clinical Measurements: Goal: Ability to maintain clinical measurements within normal limits will improve Outcome: Progressing Goal: Will remain free from infection Outcome: Progressing Goal: Diagnostic test results will improve Outcome: Progressing Goal: Respiratory complications will improve Outcome: Progressing Goal: Cardiovascular complication will be avoided Outcome: Progressing   Problem: Activity: Goal: Risk for activity intolerance will decrease Outcome: Progressing   Problem: Nutrition: Goal: Adequate nutrition will be maintained Outcome: Progressing   Problem: Coping: Goal: Level of anxiety will decrease Outcome: Progressing   Problem: Elimination: Goal: Will not experience complications related to bowel motility Outcome: Progressing Goal: Will not experience complications related to urinary retention Outcome: Progressing   Problem: Pain Managment: Goal:  General experience of comfort will improve Outcome: Progressing   Problem: Safety: Goal: Ability to remain free from injury will improve Outcome: Progressing   Problem: Skin Integrity: Goal: Risk for impaired skin integrity will decrease Outcome: Progressing   Problem: Education: Goal: Ability to describe self-care measures that may prevent or decrease complications (Diabetes Survival Skills Education) will improve Outcome: Progressing Goal: Individualized Educational Video(s) Outcome: Progressing   Problem: Coping: Goal: Ability to adjust to condition or change in health will improve Outcome: Progressing   Problem: Fluid Volume: Goal: Ability to maintain a balanced intake and output will improve Outcome: Progressing   Problem: Health Behavior/Discharge Planning: Goal: Ability to identify and utilize available resources and services will improve Outcome: Progressing Goal: Ability to manage health-related needs will improve Outcome: Progressing   Problem: Metabolic: Goal: Ability to maintain appropriate glucose levels will improve Outcome: Progressing   Problem: Nutritional: Goal: Maintenance of adequate nutrition will improve Outcome: Progressing Goal: Progress toward achieving an optimal weight will improve Outcome: Progressing   Problem: Skin Integrity: Goal: Risk for impaired skin integrity will decrease Outcome: Progressing   Problem: Tissue Perfusion: Goal: Adequacy of tissue perfusion will improve Outcome: Progressing   Problem: Education: Goal: Ability to verbalize activity precautions or restrictions will improve Outcome: Progressing Goal: Knowledge of the prescribed therapeutic regimen will improve Outcome: Progressing Goal: Understanding of discharge needs will improve Outcome: Progressing   Problem: Activity: Goal: Ability to avoid complications of mobility impairment will improve Outcome: Progressing Goal: Ability to tolerate increased activity  will improve Outcome: Progressing Goal: Will remain free from falls Outcome: Progressing   Problem: Bowel/Gastric: Goal: Gastrointestinal status for postoperative course will improve Outcome: Progressing   Problem: Clinical Measurements: Goal: Ability to maintain clinical measurements within normal limits will improve Outcome: Progressing Goal: Postoperative complications will be avoided or minimized Outcome: Progressing Goal: Diagnostic test results will improve Outcome: Progressing   Problem: Pain Management: Goal: Pain level will decrease Outcome: Progressing   Problem: Skin Integrity: Goal: Will show signs of wound healing Outcome: Progressing Note: Patient's skin was kept  CD&I at all times. Patient was re-positioned q2hrs to prevent skin breakdown.   Problem: Health Behavior/Discharge Planning: Goal: Identification of resources available to assist in meeting health care needs will improve Outcome: Progressing   Problem: Bladder/Genitourinary: Goal: Urinary functional status for postoperative course will improve Outcome: Progressing

## 2022-09-19 ENCOUNTER — Inpatient Hospital Stay (HOSPITAL_COMMUNITY): Payer: Medicaid Other

## 2022-09-19 DIAGNOSIS — M869 Osteomyelitis, unspecified: Secondary | ICD-10-CM | POA: Diagnosis not present

## 2022-09-19 LAB — GLUCOSE, CAPILLARY
Glucose-Capillary: 129 mg/dL — ABNORMAL HIGH (ref 70–99)
Glucose-Capillary: 136 mg/dL — ABNORMAL HIGH (ref 70–99)
Glucose-Capillary: 153 mg/dL — ABNORMAL HIGH (ref 70–99)
Glucose-Capillary: 172 mg/dL — ABNORMAL HIGH (ref 70–99)

## 2022-09-19 MED ORDER — LIDOCAINE HCL 1 % IJ SOLN
INTRAMUSCULAR | Status: AC
  Filled 2022-09-19: qty 20

## 2022-09-19 MED ORDER — FUROSEMIDE 10 MG/ML IJ SOLN
40.0000 mg | Freq: Once | INTRAMUSCULAR | Status: AC
  Administered 2022-09-19: 40 mg via INTRAVENOUS
  Filled 2022-09-19: qty 4

## 2022-09-19 NOTE — Plan of Care (Signed)
Problem: Education: Goal: Understanding of CV disease, CV risk reduction, and recovery process will improve Outcome: Progressing Goal: Individualized Educational Video(s) Outcome: Progressing   Problem: Activity: Goal: Ability to return to baseline activity level will improve Outcome: Progressing   Problem: Cardiovascular: Goal: Ability to achieve and maintain adequate cardiovascular perfusion will improve Outcome: Progressing Goal: Vascular access site(s) Level 0-1 will be maintained Outcome: Progressing   Problem: Health Behavior/Discharge Planning: Goal: Ability to safely manage health-related needs after discharge will improve Outcome: Progressing   Problem: Education: Goal: Knowledge of General Education information will improve Description: Including pain rating scale, medication(s)/side effects and non-pharmacologic comfort measures Outcome: Progressing   Problem: Health Behavior/Discharge Planning: Goal: Ability to manage health-related needs will improve Outcome: Progressing   Problem: Clinical Measurements: Goal: Ability to maintain clinical measurements within normal limits will improve Outcome: Progressing Goal: Will remain free from infection Outcome: Progressing Goal: Diagnostic test results will improve Outcome: Progressing Goal: Respiratory complications will improve Outcome: Progressing Goal: Cardiovascular complication will be avoided Outcome: Progressing   Problem: Activity: Goal: Risk for activity intolerance will decrease Outcome: Progressing   Problem: Nutrition: Goal: Adequate nutrition will be maintained Outcome: Progressing   Problem: Coping: Goal: Level of anxiety will decrease Outcome: Progressing   Problem: Elimination: Goal: Will not experience complications related to bowel motility Outcome: Progressing Goal: Will not experience complications related to urinary retention Outcome: Progressing   Problem: Pain Managment: Goal:  General experience of comfort will improve Outcome: Progressing   Problem: Safety: Goal: Ability to remain free from injury will improve Outcome: Progressing   Problem: Skin Integrity: Goal: Risk for impaired skin integrity will decrease Outcome: Progressing   Problem: Education: Goal: Ability to describe self-care measures that may prevent or decrease complications (Diabetes Survival Skills Education) will improve Outcome: Progressing Goal: Individualized Educational Video(s) Outcome: Progressing   Problem: Coping: Goal: Ability to adjust to condition or change in health will improve Outcome: Progressing   Problem: Fluid Volume: Goal: Ability to maintain a balanced intake and output will improve Outcome: Progressing   Problem: Health Behavior/Discharge Planning: Goal: Ability to identify and utilize available resources and services will improve Outcome: Progressing Goal: Ability to manage health-related needs will improve Outcome: Progressing   Problem: Metabolic: Goal: Ability to maintain appropriate glucose levels will improve Outcome: Progressing   Problem: Nutritional: Goal: Maintenance of adequate nutrition will improve Outcome: Progressing Goal: Progress toward achieving an optimal weight will improve Outcome: Progressing   Problem: Skin Integrity: Goal: Risk for impaired skin integrity will decrease Outcome: Progressing   Problem: Tissue Perfusion: Goal: Adequacy of tissue perfusion will improve Outcome: Progressing   Problem: Education: Goal: Ability to verbalize activity precautions or restrictions will improve Outcome: Progressing Goal: Knowledge of the prescribed therapeutic regimen will improve Outcome: Progressing Goal: Understanding of discharge needs will improve Outcome: Progressing   Problem: Activity: Goal: Ability to avoid complications of mobility impairment will improve Outcome: Progressing Goal: Ability to tolerate increased activity  will improve Outcome: Progressing Goal: Will remain free from falls Outcome: Progressing   Problem: Bowel/Gastric: Goal: Gastrointestinal status for postoperative course will improve Outcome: Progressing   Problem: Clinical Measurements: Goal: Ability to maintain clinical measurements within normal limits will improve Outcome: Progressing Goal: Postoperative complications will be avoided or minimized Outcome: Progressing Goal: Diagnostic test results will improve Outcome: Progressing   Problem: Pain Management: Goal: Pain level will decrease Outcome: Progressing   Problem: Skin Integrity: Goal: Will show signs of wound healing Outcome: Progressing   Problem: Health Behavior/Discharge   Planning: Goal: Identification of resources available to assist in meeting health care needs will improve Outcome: Progressing   Problem: Bladder/Genitourinary: Goal: Urinary functional status for postoperative course will improve Outcome: Progressing   

## 2022-09-19 NOTE — Procedures (Signed)
PROCEDURE SUMMARY:  Successful placement of image-guided dual lumen PICC line to the right brachial vein. Length 39 cm. Tip at lower SVC/RA. No complications. EBL = < 1 mL. Ready for immediate use.  Please see imaging section of Epic for full dictation.   Villa Herb PA-C 09/19/2022 8:33 AM

## 2022-09-19 NOTE — Progress Notes (Signed)
PROGRESS NOTE   David [name]  AOZ:[id number] DOB: October 27, 1960 DOA: 08/20/2022 PCP: [name], MD   Date of Service: the patient was seen and examined on 09/19/2022  Brief Narrative:  David [name] is a 62 y.o. 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 Chestine Spore on 4/25 persistent A-fib on Eliquis, DM-2, seizure disorder, HTN, HLD and chronic pain return to ED with hypoglycemia  and excess opiate intake.  Of note pt was hospitalized for disseminated MRSA bacteremia from 4/9-5/17, completed 5 weeks of vancyomycin,then daptomycin. He was discharged on p.o. doxycycline. Pt admitted again on 5/18 for hypoglycemia MRI thoracic spine at this time concerning for progressive discitis/osteomyelitis at T7-T9, extensive phlegmonous change in the paravertebral soft tissues, partial collapse of T8 vertebral body with 40% vertebral body height loss and approximately 6 mm bony retropulsion, moderate canal stenosis and mild cord edema. Underwent surgical intervention with extensive thoracic I&D, decompression, spinal stabilization hardware 5/29 with neurosurgery. Given hardware and staph aureus infection, will need indefinite antibiotics therapy    Assessment and Plan:  Disseminated MRSA bacteremia with T7-T9 discitis and osteomyelitis PICC line successfully placed today  -ID recs:      -Continue daptomycin until 6/25; then transition to oral doxycycline 100 mg         po bid indefinitely.       -Re-engage with ID 48 hr before d/c.       -Weekly cbc, cmp, cpk (on dapto), and crp, sed rate.       -OP follow up with Dr Daiva Eves 6/24 at 3:45pm -Palliative care following and helping manage patient's pain. -Continue Robaxin 750 mg every 6 hours as needed, Lyrica 75 mg nightly, Lidoderm patch, celecoxib 100 mg twice daily, BuSpar 15 mg twice daily, fentanyl patch 50 mcg every 72 hours, oxycodone 10 mg every 4  hours as needed. -Continue bowel regimen -PT/OT for discharge planning    Right foot osteomyelitis status post right fifth ray amputation 4/30 -Continue current antibiotic regimen. -WBAT -Will need outpatient follow-up with orthopedics.   Chronic systolic CHF -Continue Lasix.   Hypomagnesemia/hypokalemia Stable  -Replete as necessary   Hypertension -Continue Coreg.     Acute metabolic encephalopathy Likely secondary to pain meds versus hyponatremia versus withdrawal.   Persistent A-fib -Continue Coreg  -Continue Eliquis   Moderate pleural effusion Stable on CT abdomen and pelvis on 09/14/2022 -Continue Lasix   Constipation Improved -Continue laxatives as needed.   Hypotension Resolved.   Postop acute blood loss anemia Stable. Started posttransfusion 1 unit PRBCs  -Continue to monitor with CBC -Transfusion threshold hemoglobin < 7.   AKI Resolved. Felt likely secondary to prerenal azotemia secondary to hypotension and dehydration.   Hyponatremia Stable  -Different show includes secondary to volume overload versus poor oral intake. -Urine sodium less than 10, TSH within normal limits, serum osmolality 287, serum cortisol is 12. -Continue to monitor with BMP     Diabetes mellitus type 2 CBGs 129, Hemoglobin A1c 9.1 (07/13/2022). -Continue Semglee 20 units daily. -Continue SSI.   Left-sided abdominal pain with swelling CT abdomen pelvis with contrast on 09/04/2022 reviewed.  No acute abnormality noted but there was evidence of small bilateral pleural effusions, bilateral lower lobe atelectasis or infiltrates, diffuse CAD, aortic atherosclerosis, left lower pole nonobstructing nephrolithiasis and anasarca throughout subcutaneous soft tissues. -Continue Lasix   Subjective: No acute concerns. Has on going pain but it is controlled with  pain meds.   Physical Exam:  Vitals:   09/18/22 1500 09/18/22 1936 09/19/22 0008 09/19/22 0328  BP: (!) 161/97 (!) 153/87 (!)  159/91 (!) 149/99  Pulse: 63 63 71 64  Resp: 18 18 18 16   Temp: 97.9 F (36.6 C) 98.3 F (36.8 C) 98 F (36.7 C) 98.2 F (36.8 C)  TempSrc: Oral Oral Oral Oral  SpO2: 91% 93% 97% 94%  Weight:      Height:        General: Alert, tearful at times Cardio: Normal S1 and S2, RRR, no r/m/g Pulm: CTAB, normal work of breathing Abdomen: Bowel sounds normal. Abdomen soft and non-tender.  Extremities: No peripheral edema.  Neuro: Cranial nerves grossly intact   Data Reviewed:  I have personally reviewed and interpreted labs, imaging.   CBC: Recent Labs  Lab 09/14/22 0330 09/17/22 0326 09/18/22 0411  WBC 10.1 8.8 8.2  HGB 9.3* 8.6* 8.9*  HCT 29.1* 27.1* 28.1*  MCV 93.0 91.2 91.2  PLT 260 230 228    Basic Metabolic Panel: Recent Labs  Lab 09/13/22 0434 09/14/22 0330 09/17/22 0326 09/18/22 0411  NA 131* 130* 129* 130*  K 5.1 4.3 4.5 4.6  CL 101 103 98 98  CO2 22 20* 22 22  GLUCOSE 131* 111* 120* 140*  BUN 11 10 15 14   CREATININE 1.06 1.08 1.36* 1.16  CALCIUM 7.6* 8.7* 8.6* 8.5*  MG  --  1.8  --   --     GFR: Estimated Creatinine Clearance: 74.3 mL/min (by C-G formula based on SCr of 1.16 mg/dL). Liver Function Tests: No results for input(s): "AST", "ALT", "ALKPHOS", "BILITOT", "PROT", "ALBUMIN" in the last 168 hours.  Coagulation Profile: No results for input(s): "INR", "PROTIME" in the last 168 hours.    Code Status:  Full code.  Code status decision has been confirmed with: patient  Family Communication:     Severity of Illness:  The appropriate patient status for this patient is INPATIENT. Inpatient status is judged to be reasonable and necessary in order to provide the required intensity of service to ensure the patient's safety. The patient's presenting symptoms, physical exam findings, and initial radiographic and laboratory data in the context of their chronic comorbidities is felt to place them at high risk for further clinical deterioration.  Furthermore, it is not anticipated that the patient will be medically stable for discharge from the hospital within 2 midnights of admission.   * I certify that at the point of admission it is my clinical judgment that the patient will require inpatient hospital care spanning beyond 2 midnights from the point of admission due to high intensity of service, high risk for further deterioration and high frequency of surveillance required.*   Rolm Gala MD  09/19/2022 9:10 AM

## 2022-09-19 NOTE — Progress Notes (Signed)
PT Cancellation Note  Patient Details Name: David [name] MRN: [id number] DOB: [birth year]   Cancelled Treatment:    Reason Eval/Treat Not Completed: Pain limiting ability to participate. Pt reports increased pain from PICC and just not having a good day, requests hold until tomorrow. Will progress mobility tomorrow.   Lyanne Co, PT  Acute Rehab Services Secure chat preferred Office [phone]    Elyse Hsu 09/19/2022, 2:19 PM

## 2022-09-20 DIAGNOSIS — R7881 Bacteremia: Secondary | ICD-10-CM | POA: Diagnosis not present

## 2022-09-20 DIAGNOSIS — E162 Hypoglycemia, unspecified: Secondary | ICD-10-CM | POA: Diagnosis not present

## 2022-09-20 DIAGNOSIS — M4646 Discitis, unspecified, lumbar region: Secondary | ICD-10-CM | POA: Diagnosis not present

## 2022-09-20 DIAGNOSIS — E876 Hypokalemia: Secondary | ICD-10-CM | POA: Diagnosis not present

## 2022-09-20 LAB — GLUCOSE, CAPILLARY
Glucose-Capillary: 147 mg/dL — ABNORMAL HIGH (ref 70–99)
Glucose-Capillary: 141 mg/dL — ABNORMAL HIGH (ref 70–99)
Glucose-Capillary: 145 mg/dL — ABNORMAL HIGH (ref 70–99)
Glucose-Capillary: 128 mg/dL — ABNORMAL HIGH (ref 70–99)

## 2022-09-20 MED ORDER — PHENYLEPHRINE HCL-NACL 20-0.9 MG/250ML-% IV SOLN
INTRAVENOUS | Status: AC
  Filled 2022-09-20: qty 1000

## 2022-09-20 MED ORDER — PROPOFOL 1000 MG/100ML IV EMUL
INTRAVENOUS | Status: AC
  Filled 2022-09-20: qty 400

## 2022-09-20 MED ORDER — OXYCODONE HCL 5 MG PO TABS
5.0000 mg | ORAL_TABLET | ORAL | Status: DC | PRN
  Administered 2022-09-20: 10 mg via ORAL
  Administered 2022-09-20 (×2): 5 mg via ORAL
  Administered 2022-09-21 (×5): 10 mg via ORAL
  Administered 2022-09-22 (×2): 5 mg via ORAL
  Administered 2022-09-22 – 2022-09-23 (×6): 10 mg via ORAL
  Administered 2022-09-23 (×2): 5 mg via ORAL
  Administered 2022-09-23 – 2022-09-28 (×25): 10 mg via ORAL
  Administered 2022-09-28: 5 mg via ORAL
  Administered 2022-09-28 – 2022-09-29 (×2): 10 mg via ORAL
  Administered 2022-09-29: 5 mg via ORAL
  Administered 2022-09-29 – 2022-10-18 (×68): 10 mg via ORAL
  Filled 2022-09-20 (×2): qty 2
  Filled 2022-09-20: qty 1
  Filled 2022-09-20 (×19): qty 2
  Filled 2022-09-20: qty 1
  Filled 2022-09-20 (×12): qty 2
  Filled 2022-09-20: qty 1
  Filled 2022-09-20 (×31): qty 2
  Filled 2022-09-20: qty 1
  Filled 2022-09-20 (×5): qty 2
  Filled 2022-09-20: qty 1
  Filled 2022-09-20 (×47): qty 2

## 2022-09-20 NOTE — Plan of Care (Signed)
  Problem: Education: Goal: Knowledge of General Education information will improve Description: Including pain rating scale, medication(s)/side effects and non-pharmacologic comfort measures Outcome: Progressing   Problem: Health Behavior/Discharge Planning: Goal: Ability to manage health-related needs will improve Outcome: Progressing   Problem: Nutrition: Goal: Adequate nutrition will be maintained Outcome: Progressing   Problem: Elimination: Goal: Will not experience complications related to bowel motility Outcome: Progressing   Problem: Activity: Goal: Risk for activity intolerance will decrease Outcome: Not Progressing   Problem: Coping: Goal: Level of anxiety will decrease Outcome: Not Progressing   Problem: Skin Integrity: Goal: Risk for impaired skin integrity will decrease Outcome: Not Progressing

## 2022-09-20 NOTE — Progress Notes (Signed)
Occupational Therapy Treatment Patient Details Name: David [name] MRN: [id number] DOB: [birth year] Today's Date: 09/20/2022   History of present illness Pt is a 62 y.o. male who presented 08/20/22  with hypoglycemia. 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 5/28 surgical repair: T5-11 posterior/posterolateral arthrodesis  & Transpedicular decompression with partial corpectomy, left T8 & T8-9 laminectomy, bilateral facetectomy. 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 Pt with improving BLE coordination though limited by impaired proprioception of B feet. Pt eager to implement cues for postural correction, sequencing steps and safety techniques. Pt able to don TLSO brace with setup though assist required for socks due to B feet swelling today. Pt may benefit from consideration of Carley Hammed walker for mobility (as B knee buckling not present today; hyperextension observed). Continue to feel pt will progress well with intensive inpatient rehab services at [facility].   Recommendations for follow up therapy are one component of a multi-disciplinary discharge planning process, led by the attending physician.  Recommendations may be updated based on patient status, additional functional criteria and insurance authorization.    Assistance Recommended at Discharge Frequent or constant Supervision/Assistance  Patient can return home with the following  Two people to help with walking and/or transfers;Two people to help with bathing/dressing/bathroom   Equipment Recommendations  BSC/3in1;Wheelchair (measurements  OT);Wheelchair cushion (measurements OT)    Recommendations for Other Services Rehab consult    Precautions / Restrictions Precautions Precautions: Fall;Back Required Braces or Orthoses: Spinal Brace Spinal Brace: Thoracolumbosacral orthotic;Applied in sitting position Restrictions Weight Bearing Restrictions: Yes RLE Weight Bearing: Weight bearing as tolerated       Mobility Bed Mobility Overal bed mobility: Needs Assistance Bed Mobility: Supine to Sit     Supine to sit: Supervision, HOB elevated          Transfers Overall transfer level: Needs assistance Equipment used: Ambulation equipment used Transfers: Sit to/from Stand Sit to Stand: Min guard           General transfer comment: able to stand in Sara+ without assist Transfer via Lift Equipment: Nurse, mental health Overall balance assessment: Needs assistance Sitting-balance support: Bilateral upper extremity supported, Feet supported, No upper extremity supported Sitting balance-Leahy Scale: Fair     Standing balance support: Bilateral upper extremity supported, During functional activity Standing balance-Leahy Scale: Poor                             ADL either performed or assessed with clinical judgement   ADL Overall ADL's : Needs assistance/impaired     Grooming: Set up;Sitting;Oral care Grooming Details (indicate cue type and reason): in recliner at end of session         Upper Body Dressing : Set up;Sitting Upper Body Dressing Details (indicate cue type and reason): able to don TLSO brace with Setup without IV connected                   General ADL Comments: Focus on Sara+ for gait with pt improving BLE coordination though still limited by decreased sensation/proprioception  of B feet.    Extremity/Trunk Assessment Upper Extremity Assessment Upper Extremity Assessment: Overall WFL for tasks assessed   Lower Extremity Assessment Lower Extremity Assessment: Defer to  PT evaluation        Vision   Vision Assessment?: No apparent visual deficits   Perception     Praxis      Cognition Arousal/Alertness: Awake/alert Behavior During Therapy: WFL for tasks assessed/performed Overall Cognitive Status: No family/caregiver present to determine baseline cognitive functioning Area of Impairment: Problem solving, Awareness, Safety/judgement                         Safety/Judgement: Decreased awareness of deficits, Decreased awareness of safety Awareness: Emergent, Anticipatory Problem Solving: Slow processing, Requires verbal cues General Comments: hx of impulsivity but overall very appropriate and involved in safety precautions        Exercises      Shoulder Instructions       General Comments      Pertinent Vitals/ Pain       Pain Assessment Pain Assessment: Faces Faces Pain Scale: Hurts little more Pain Location: lower part of incision Pain Descriptors / Indicators: Discomfort, Grimacing, Guarding Pain Intervention(s): Monitored during session, Premedicated before session  Home Living                                          Prior Functioning/Environment              Frequency  Min 2X/week        Progress Toward Goals  OT Goals(current goals can now be found in the care plan section)  Progress towards OT goals: Progressing toward goals  Acute Rehab OT Goals Patient Stated Goal: be able to walk out of here OT Goal Formulation: With patient Time For Goal Achievement: 09/26/22 Potential to Achieve Goals: Fair ADL Goals Pt Will Perform Grooming: Independently;standing Pt Will Perform Lower Body Bathing: with mod assist;sit to/from stand;with adaptive equipment Pt Will Perform Lower Body Dressing: with mod assist;sit to/from stand;with adaptive equipment Pt Will Transfer to Toilet: with mod assist;stand pivot transfer;bedside commode Pt Will Perform Toileting - Clothing Manipulation and  hygiene: with modified independence;sit to/from stand Additional ADL Goal #1: Pt to increase standing tolerance > 3 min during functional tasks to decrease fall risk during LB ADLs  Plan Discharge plan remains appropriate    Co-evaluation    PT/OT/SLP Co-Evaluation/Treatment: Yes Reason for Co-Treatment: Complexity of the patient's impairments (multi-system involvement);For patient/therapist safety;To address functional/ADL transfers   OT goals addressed during session: ADL's and self-care;Proper use of Adaptive equipment and DME      AM-PAC OT "6 Clicks" Daily Activity     Outcome Measure   Help from another person eating meals?: None Help from another person taking care of personal grooming?: A Little Help from another person toileting, which includes using toliet, bedpan, or urinal?: Total Help from another person bathing (including washing, rinsing, drying)?: A Lot Help from another person to put on and taking off regular upper body clothing?: A Lot Help from another person to put on and taking off regular lower body clothing?: A Little 6 Click Score: 15    End of Session Equipment Utilized During Treatment: Back brace  OT Visit Diagnosis: Unsteadiness on feet (R26.81);Other abnormalities of gait and mobility (R26.89);Muscle weakness (generalized) (M62.81);Pain   Activity Tolerance Patient tolerated treatment  well   Patient Left in chair;with call bell/phone within reach;with chair alarm set   Nurse Communication Mobility status        Time: [id number] OT Time Calculation (min): 52 min  Charges: OT General Charges $OT Visit: 1 Visit OT Treatments $Self Care/Home Management : 8-22 mins $Therapeutic Activity: 8-22 mins  Bradd Canary, OTR/L Acute Rehab Services Office: [phone]   Lorre Munroe 09/20/2022, 12:53 PM

## 2022-09-20 NOTE — Progress Notes (Signed)
Physical Therapy Treatment Patient Details Name: David [name] MRN: [id number] DOB: 1960/05/28 Today's Date: 09/20/2022   History of Present Illness Pt is a 62 y.o. male who presented 08/20/22  with hypoglycemia. 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 5/28 surgical repair: T5-11 posterior/posterolateral arthrodesis  & Transpedicular decompression with partial corpectomy, left T8 & T8-9 laminectomy, bilateral facetectomy. 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 With assistance from machine pt able to stand with min guard A. +3 needed to ambulate 20' 4x with manual assistance for B knee control due to hyperextension, scissoring, and decreased control of foot placement. Continue to recommend >3 hrs/ day inpt therapy once stable. PT will continue to follow.    Recommendations for follow up therapy are one component of a multi-disciplinary discharge planning process, led by the attending physician.  Recommendations may be updated based on patient status, additional functional criteria and insurance authorization.  Follow Up Recommendations  Can patient physically be transported by private vehicle: No    Assistance Recommended at Discharge Frequent or constant Supervision/Assistance  Patient can return home with the following Assist for transportation;Two people to help with walking and/or transfers;A lot of help with bathing/dressing/bathroom;Assistance with cooking/housework;Direct supervision/assist for financial management;Direct supervision/assist  for medications management   Equipment Recommendations  Rolling walker (2 wheels);Wheelchair cushion (measurements PT);Wheelchair (measurements PT);BSC/3in1    Recommendations for Other Services Rehab consult     Precautions / Restrictions Precautions Precautions: Fall;Back Precaution Booklet Issued: No Required Braces or Orthoses: Spinal Brace Spinal Brace: Thoracolumbosacral orthotic;Applied in sitting position Other Brace: for use when ambulating. Restrictions Weight Bearing Restrictions: Yes RLE Weight Bearing: Weight bearing as tolerated RLE Partial Weight Bearing Percentage or Pounds: heel weight bearing Other Position/Activity Restrictions: with Darco shoe, have not been using since amp in 4/24     Mobility  Bed Mobility Overal bed mobility: Needs Assistance Bed Mobility: Supine to Sit Rolling: Supervision   Supine to sit: Supervision, HOB elevated     General bed mobility comments: pt able to come to EOB with close supervision and increased time but no physical assist    Transfers Overall transfer level: Needs assistance Equipment used: Ambulation equipment used Transfers: Sit to/from Stand Sit to Stand: Min guard           General transfer comment: able to stand in Sara+ with min guard with vertical assist from machine Transfer via Lift Equipment: Hydrographic surveyor  Ambulation/Gait Ambulation/Gait assistance: +2 physical assistance, Mod assist (+3 for equip) Gait Distance (Feet): 20 Feet (4x) Assistive device:  (sara +) Gait Pattern/deviations: Decreased stride length, Decreased dorsiflexion - right, Decreased dorsiflexion - left, Narrow base of support, Ataxic, Scissoring, Trunk flexed, Step-through pattern, Knee hyperextension - right, Knee hyperextension - left Gait velocity: reduced Gait velocity interpretation: <1.31 ft/sec, indicative of household ambulator   General Gait Details: pt stepping on R foot with L, needed manual assist to prevent B knee  buckling. Pt does not feel floor or have sense of foot placement. Worked on decreasing step length to prevent hyperextension and avoid  baging shin against blue plate of Sara plus. vc's for core activation and trunk extension   Stairs             Wheelchair Mobility    Modified Rankin (Stroke Patients Only)       Balance Overall balance assessment: Needs assistance Sitting-balance support: Bilateral upper extremity supported, Feet supported, No upper extremity supported Sitting balance-Leahy Scale: Fair Sitting balance - Comments: donned TLSO independently over head EOB   Standing balance support: Bilateral upper extremity supported, During functional activity Standing balance-Leahy Scale: Poor Standing balance comment: heavy reliance on UE support , diminshed balance reactions                            Cognition Arousal/Alertness: Awake/alert Behavior During Therapy: WFL for tasks assessed/performed Overall Cognitive Status: Impaired/Different from baseline Area of Impairment: Problem solving, Awareness, Safety/judgement                   Current Attention Level: Alternating Memory: Decreased short-term memory Following Commands: Follows multi-step commands with increased time Safety/Judgement: Decreased awareness of deficits, Decreased awareness of safety Awareness: Emergent, Anticipatory Problem Solving: Slow processing, Requires verbal cues General Comments: hx of impulsivity but overall very appropriate and involved in safety precautions        Exercises General Exercises - Lower Extremity Ankle Circles/Pumps: AROM, Both, Seated, 15 reps Quad Sets: AROM, Strengthening, Both, 10 reps, Seated (with 10 sec hold) Gluteal Sets: AROM, 15 reps, Seated    General Comments General comments (skin integrity, edema, etc.): 3/4 DOE, HR 80 bpm, SPO2 96% on RA      Pertinent Vitals/Pain Pain Assessment Pain Assessment: Faces Faces Pain Scale: Hurts  little more Pain Location: lower part of incision Pain Descriptors / Indicators: Discomfort, Grimacing, Guarding, Cramping Pain Intervention(s): Limited activity within patient's tolerance, Monitored during session, Premedicated before session    Home Living                          Prior Function            PT Goals (current goals can now be found in the care plan section) Acute Rehab PT Goals Patient Stated Goal: to walk again PT Goal Formulation: With patient Time For Goal Achievement: 09/28/22 Potential to Achieve Goals: Good Progress towards PT goals: Progressing toward goals    Frequency    Min 5X/week      PT Plan Current plan remains appropriate    Co-evaluation PT/OT/SLP Co-Evaluation/Treatment: Yes Reason for Co-Treatment: Complexity of the patient's impairments (multi-system involvement);For patient/therapist safety;To address functional/ADL transfers PT goals addressed during session: Mobility/safety with mobility;Balance;Proper use of DME OT goals addressed during session: ADL's and self-care;Proper use of Adaptive equipment and DME      AM-PAC PT "6 Clicks" Mobility   Outcome Measure  Help needed turning from your back to your side while in a flat bed without using bedrails?: A Little Help needed moving from lying on your back to sitting on the side of a flat bed without using bedrails?: A Little Help needed moving to and from a bed to a chair (including a wheelchair)?: Total Help needed standing up from a chair using your arms (e.g., wheelchair or bedside chair)?: Total Help needed to walk in hospital room?: Total Help needed climbing 3-5 steps with a railing? : Total 6 Click Score: 10    End of Session Equipment Utilized During  Treatment: Back brace;Gait belt Activity Tolerance: Patient tolerated treatment well Patient left: in chair;with call bell/phone within reach;with chair alarm set Nurse Communication: Mobility status PT Visit  Diagnosis: Difficulty in walking, not elsewhere classified (R26.2);Other abnormalities of gait and mobility (R26.89);Muscle weakness (generalized) (M62.81);Pain;Unsteadiness on feet (R26.81) Pain - Right/Left: Right Pain - part of body: Ankle and joints of foot     Time: 1202-1244 PT Time Calculation (min) (ACUTE ONLY): 42 min  Charges:  $Gait Training: 8-22 mins                     Lyanne Co, PT  Acute Rehab Services Secure chat preferred Office [phone]    Lawana Chambers [place] 09/20/2022, 1:53 PM

## 2022-09-20 NOTE — Progress Notes (Signed)
PROGRESS NOTE   David [name]  ZOX:[id number] DOB: 01-30-1961 DOA: 08/20/2022 PCP: [name], MD   Date of Service: the patient was seen and examined on 09/20/2022  Brief Narrative:  David [name] is a 62 y.o. 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 Chestine Spore on 4/25 persistent A-fib on Eliquis, DM-2, seizure disorder, HTN, HLD and chronic pain return to ED with hypoglycemia  and excess opiate intake.  Of note pt was hospitalized for disseminated MRSA bacteremia from 4/9-5/17, completed 5 weeks of vancyomycin,then daptomycin. He was discharged on p.o. doxycycline. Pt admitted again on 5/18 for hypoglycemia MRI thoracic spine at this time concerning for progressive discitis/osteomyelitis at T7-T9, extensive phlegmonous change in the paravertebral soft tissues, partial collapse of T8 vertebral body with 40% vertebral body height loss and approximately 6 mm bony retropulsion, moderate canal stenosis and mild cord edema. Underwent surgical intervention with extensive thoracic I&D, decompression, spinal stabilization hardware 5/29 with neurosurgery. Given hardware and staph aureus infection, will need indefinite antibiotics therapy    Assessment and Plan:  Disseminated MRSA bacteremia with T7-T9 discitis and osteomyelitis PICC line in place -ID recs:      -Continue daptomycin until 6/25; then transition to oral doxycycline 100 mg po bid indefinitely.       -Re-engage with ID 48 hr before d/c.       -Weekly cbc, cmp, cpk (on dapto), and crp, sed rate.       -OP follow up with Dr Daiva Eves 6/24 at 3:45pm -Palliative care following and helping manage patient's pain. -Continue Robaxin 750 mg every 6 hours as needed, Lyrica 75 mg nightly, Lidoderm patch, celecoxib 100 mg twice daily, BuSpar 15 mg twice daily, fentanyl patch 50 mcg every 72 hours, oxycodone 10 mg every 4 hours as needed. -Continue  bowel regimen -PT/OT for discharge planning    Right foot osteomyelitis status post right fifth ray amputation 4/30 -Continue current antibiotic regimen. -WBAT -Will need outpatient follow-up with orthopedics.   Chronic systolic CHF -Continue Lasix.   Hypomagnesemia/hypokalemia Stable  -Replete as necessary   Hypertension -Continue Coreg.     Acute metabolic encephalopathy Likely secondary to pain meds versus hyponatremia versus withdrawal.   Persistent A-fib -Continue Coreg  -Continue Eliquis   Moderate pleural effusion Stable on CT abdomen and pelvis on 09/14/2022. Respiratory status normal.  -Continue Lasix   Constipation Improved -Continue laxatives as needed.   Postop acute blood loss anemia Stable. Started posttransfusion 1 unit PRBCs  -Continue to monitor with CBC -Transfusion threshold hemoglobin < 7.   AKI Resolved. Felt likely secondary to prerenal azotemia secondary to hypotension and dehydration.   Hyponatremia Stable  -Different show includes secondary to volume overload versus poor oral intake. -Urine sodium less than 10, TSH within normal limits, serum osmolality 287, serum cortisol is 12. -Continue to monitor with BMP     Diabetes mellitus type 2 CBGs 129, Hemoglobin A1c 9.1 (07/13/2022). -Continue Semglee 20 units daily. -Continue SSI.   Left-sided abdominal pain with swelling CT abdomen pelvis with contrast on 09/04/2022 reviewed.  No acute abnormality noted but there was evidence of small bilateral pleural effusions, bilateral lower lobe atelectasis or infiltrates, diffuse CAD, aortic atherosclerosis, left lower pole nonobstructing nephrolithiasis and anasarca throughout subcutaneous soft tissues. -Continue Lasix   Subjective: Pt reports having significant back pain. He is tearful about his prognosis. States he has no idea what his plan is and  nobody has told him. He states only once several weeks ago has anyone told him he's going to rehab next  and hasn't heard anything else.  Physical Exam:  Vitals:   09/19/22 1939 09/19/22 2314 09/20/22 0323 09/20/22 0500  BP: (!) 172/92 (!) 153/101 (!) 165/82   Pulse: 65  (!) 56   Resp: 18 18 18    Temp: 98.2 F (36.8 C) 97.8 F (36.6 C) 98.4 F (36.9 C)   TempSrc: Oral Oral Oral   SpO2: 97% 98% 95%   Weight:    90 kg  Height:        General: Alert, tearful at times Cardio: Normal S1 and S2, RRR, no r/m/g Pulm: CTAB, normal work of breathing Abdomen: Bowel sounds normal. Abdomen soft and non-tender.  Extremities: significant LE edema bilaterally. Moving both spontaneously Neuro: Cranial nerves grossly intact   Data Reviewed:  I have personally reviewed and interpreted labs, imaging.   CBC: Recent Labs  Lab 09/14/22 0330 09/17/22 0326 09/18/22 0411  WBC 10.1 8.8 8.2  HGB 9.3* 8.6* 8.9*  HCT 29.1* 27.1* 28.1*  MCV 93.0 91.2 91.2  PLT 260 230 228    Basic Metabolic Panel: Recent Labs  Lab 09/14/22 0330 09/17/22 0326 09/18/22 0411  NA 130* 129* 130*  K 4.3 4.5 4.6  CL 103 98 98  CO2 20* 22 22  GLUCOSE 111* 120* 140*  BUN 10 15 14   CREATININE 1.08 1.36* 1.16  CALCIUM 8.7* 8.6* 8.5*  MG 1.8  --   --     Code Status:  Full code.  Code status decision has been confirmed with: patient  Family Communication: none at bedside    Severity of Illness:  The appropriate patient status for this patient is INPATIENT. Inpatient status is judged to be reasonable and necessary in order to provide the required intensity of service to ensure the patient's safety. The patient's presenting symptoms, physical exam findings, and initial radiographic and laboratory data in the context of their chronic comorbidities is felt to place them at high risk for further clinical deterioration. Furthermore, it is not anticipated that the patient will be medically stable for discharge from the hospital within 2 midnights of admission.   * I certify that at the point of admission it is my  clinical judgment that the patient will require inpatient hospital care spanning beyond 2 midnights from the point of admission due to high intensity of service, high risk for further deterioration and high frequency of surveillance required.David Bock MD  09/20/2022 7:56 AM

## 2022-09-21 ENCOUNTER — Inpatient Hospital Stay (HOSPITAL_COMMUNITY): Payer: Medicaid Other

## 2022-09-21 DIAGNOSIS — E162 Hypoglycemia, unspecified: Secondary | ICD-10-CM | POA: Diagnosis not present

## 2022-09-21 LAB — BASIC METABOLIC PANEL
Anion gap: 12 (ref 5–15)
BUN: 12 mg/dL (ref 6–20)
CO2: 26 mmol/L (ref 22–32)
Calcium: 8.9 mg/dL (ref 8.9–10.3)
Chloride: 94 mmol/L — ABNORMAL LOW (ref 98–111)
Creatinine, Ser: 1.12 mg/dL (ref 0.61–1.24)
GFR, Estimated: >60 mL/min (ref >60)
Glucose, Bld: 145 mg/dL — ABNORMAL HIGH (ref 70–99)
Potassium: 4.3 mmol/L (ref 3.5–5.1)
Sodium: 132 mmol/L — ABNORMAL LOW (ref 135–145)

## 2022-09-21 LAB — GLUCOSE, CAPILLARY
Glucose-Capillary: 111 mg/dL — ABNORMAL HIGH (ref 70–99)
Glucose-Capillary: 164 mg/dL — ABNORMAL HIGH (ref 70–99)
Glucose-Capillary: 127 mg/dL — ABNORMAL HIGH (ref 70–99)
Glucose-Capillary: 176 mg/dL — ABNORMAL HIGH (ref 70–99)

## 2022-09-21 MED ORDER — LIDOCAINE HCL 1 % IJ SOLN
INTRAMUSCULAR | Status: AC
  Filled 2022-09-21: qty 20

## 2022-09-21 MED ORDER — LIDOCAINE HCL 1 % IJ SOLN: 20.0000 mL | Freq: Once | INTRAMUSCULAR | Status: DC

## 2022-09-21 NOTE — Progress Notes (Signed)
PROGRESS NOTE   David [name]  ZOX:[id number] DOB: [birth year] DOA: 08/20/2022 PCP: [name], MD   Date of Service: the patient was seen and examined on 09/21/2022  Brief Narrative:  62 y.o. 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 Chestine Spore on 4/25  persistent A-fib on Eliquis CHad2vasc2>3 since 09/2021  DM-2, seizure disorder, HTN, HLD and chronic pain r Return to ED with hypoglycemia and excess opiate intake.  Hospitalized for disseminated MRSA bacteremia from 4/9-5/17--hospitalization interspersed with AKI-angio plasty tibioperoneal trunk + peroneal Dr. Chestine Spore 4/25 4/30 underwent R fifth ray revision amputation-MRI 08/08/2022 showed persistent osteomyelitis T7-T9 with involvement of T7 vertebrae compared to prior patient was changed to oral antibiotics after completing 5 weeks of vancyomycin,then daptomycin. discharged on p.o. doxycycline-- Patient developed at that time bilateral pneumonia with a chest tube placed by IR on 4/24 and removed on 4/26  Re-Admitted 5/18 for profound hypoglycemia  5/20 CT chest showed discitis at T7-T8-T9 with progressive bony destruction + collapsed T8 and retropulsion of 8 mm 5/20 MRI thoracic spine at this time concerning for progressive discitis/osteomyelitis at T7-T9, extensive phlegmonous change in the paravertebral soft tissues, partial collapse of T8 vertebral body with 40% vertebral body height loss and approximately 6 mm bony retropulsion, moderate canal stenosis and mild cord edema.  5/22 ID reconsulted 5/24 palliative care consulted for pain management assistance 5/28  Dr. Hoyt Koch thoracic decompression T5-T11 posterior posterolateral arthrodesis with left T8 transpedicular decompression laminectomies and segmental instrumentation with screws etc.   Assessment and Plan:  Disseminated MRSA bacteremia with  T7-T9 discitis and osteomyelitis PICC line in place -ID recs:      -Continue daptomycin until 6/25; then transition to oral doxycycline 100 mg po bid indefinitely.       -Re-engage with ID 48 hr before d/c.       -Weekly cbc, cmp, cpk (on dapto), and crp, sed rate.       -OP follow up with Dr Daiva Eves 6/24 at 3:45pm -Tylenol 1000 3 times daily Celebrex 100 twice daily Duragesic 50 mcg every 48 Oxy IR 5-10 every 4 as needed-Cymbalta 20 daily-can continue Robaxin-750 every 6 as needed spasm, Lyrica 75 at bedtime -Continue bowel regimen -Placement awaiting skilled nursing facility--will reach out to [facility] as do not see any recent notes -Will ask neurosurgery to assess wound as per picture below but at this time I have placed order for Aquacel over lower part of the wound     Right foot osteomyelitis status post right fifth ray amputation 4/30 -Continue current antibiotic regimen. -WBAT -Will need outpatient follow-up with Dr. Felecia Jan seems clean and uninfected on the right side   Chronic systolic CHF Persistent A-fib CHADVASC >4 -Continue Lasix 20 daily, Coreg 3.125 twice daily, digoxin 125 daily -Blood pressure slightly elevated May increase Coreg depending on trends   Hypomagnesemia/hypokalemia Stable  -Replete as necessary   Acute metabolic encephalopathy Likely secondary to pain meds versus hyponatremia versus withdrawal. Mentation seems stable at this time  Moderate pleural effusion Stable on CT abdomen and pelvis on 09/14/2022. Respiratory status normal.  Repeat chest x-ray done on 6/14 shows some infiltrates however he is currently stable on room air and we will watch him   Constipation Improved -Continue laxatives as needed.   Postop acute blood loss anemia Stable. Started posttransfusion 1 unit PRBCs  -Continue to monitor with CBC -Transfusion  threshold hemoglobin < 7.   AKI ?2/2 to prerenal azotemia secondary to hypotension and dehydration.    Hyponatremia Stable  -Different show includes secondary to volume overload versus poor oral intake. -Urine sodium less than 10, TSH within normal limits, serum osmolality 287, serum cortisol is 12. -Improving slowly but surely    Diabetes mellitus type 2 A1c 9.1 on 07/2022 CBGs 110-160 -Continue Semglee 20 units daily. -Continue SSI.   Left-sided abdominal pain with swelling CT abdomen pelvis with contrast on 09/04/2022 reviewed.  No acute abnormality noted but there was evidence of small bilateral pleural effusions, bilateral lower lobe atelectasis or infiltrates, diffuse CAD, aortic atherosclerosis, left lower pole nonobstructing nephrolithiasis and anasarca throughout subcutaneous soft tissues.    Subjective:  Quite despondent and labile labile emotionally No chest pain no fever Eating drinking okay Some back pain at the lower part of scar  Physical Exam:  Vitals:   09/21/22 0730 09/21/22 0956 09/21/22 1158 09/21/22 1636  BP:  (!) 141/82 (!) 155/90 (!) 138/92  Pulse:  66 81 71  Resp:  15 16 15   Temp:  (!) 97.5 F (36.4 C) 98.5 F (36.9 C) 98.3 F (36.8 C)  TempSrc:  Oral Oral Oral  SpO2:  92% 99% 99%  Weight: 81.8 kg     Height:        Coherent emotional Chest clear no added sound S1-S2 slightly tachycardic Exam of the wound shows   Data Reviewed:  I have personally reviewed and interpreted labs, imaging.   CBC: Recent Labs  Lab 09/17/22 0326 09/18/22 0411  WBC 8.8 8.2  HGB 8.6* 8.9*  HCT 27.1* 28.1*  MCV 91.2 91.2  PLT 230 228    Basic Metabolic Panel: Recent Labs  Lab 09/17/22 0326 09/18/22 0411 09/21/22 0335  NA 129* 130* 132*  K 4.5 4.6 4.3  CL 98 98 94*  CO2 22 22 26   GLUCOSE 120* 140* 145*  BUN 15 14 12   CREATININE 1.36* 1.16 1.12  CALCIUM 8.6* 8.5* 8.9    Code Status:  Full code.  Code status decision has been confirmed with: patient  Family Communication: none at bedside   Rhetta Mura MD  09/21/2022 5:35 PM

## 2022-09-21 NOTE — Progress Notes (Signed)
Successful exchange of double lumen PICC line to right brachial vein. Length extended to 42cm (previously 39cm) with tip at lower SVC/RA PICC capped No complications Ready for use.  EBL < 5 mL   Hoyt Koch PA-C 09/21/2022 9:24 AM

## 2022-09-21 NOTE — Progress Notes (Signed)
PT Cancellation Note  Patient Details Name: David [name] MRN: [id number] DOB: [birth year]   Cancelled Treatment:    Reason Eval/Treat Not Completed: Pain limiting ability to participate (pt reporting PICC change painful and taxing, he is very tired and does not feel strong enough to mobilize at this time. requesting therapy attempt tomorrow. Will follow up at later date/time as schedule allows.)   Renaldo Fiddler PT, DPT Acute Rehabilitation Services Office [phone]  09/21/22 2:17 PM

## 2022-09-22 DIAGNOSIS — E162 Hypoglycemia, unspecified: Secondary | ICD-10-CM | POA: Diagnosis not present

## 2022-09-22 LAB — GLUCOSE, CAPILLARY
Glucose-Capillary: 139 mg/dL — ABNORMAL HIGH (ref 70–99)
Glucose-Capillary: 194 mg/dL — ABNORMAL HIGH (ref 70–99)
Glucose-Capillary: 110 mg/dL — ABNORMAL HIGH (ref 70–99)
Glucose-Capillary: 142 mg/dL — ABNORMAL HIGH (ref 70–99)

## 2022-09-22 LAB — CK: Total CK: 27 U/L — ABNORMAL LOW (ref 49–397)

## 2022-09-22 NOTE — Progress Notes (Signed)
PROGRESS NOTE   David [name]  ZOX:[id number] DOB: 1961-02-20 DOA: 08/20/2022 PCP: [name], MD   Date of Service: the patient was seen and examined on 09/22/2022  Brief Narrative:  62 y.o. 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 Chestine Spore on 4/25  persistent A-fib on Eliquis CHad2vasc2>3 since 09/2021  DM-2, seizure disorder, HTN, HLD and chronic pain r Return to ED with hypoglycemia and excess opiate intake.  Hospitalized for disseminated MRSA bacteremia from 4/9-5/17--hospitalization interspersed with AKI-angio plasty tibioperoneal trunk + peroneal Dr. Chestine Spore 4/25 4/30 underwent R fifth ray revision amputation-MRI 08/08/2022 showed persistent osteomyelitis T7-T9 with involvement of T7 vertebrae compared to prior patient was changed to oral antibiotics after completing 5 weeks of vancyomycin,then daptomycin. discharged on p.o. doxycycline-- Patient developed at that time bilateral pneumonia with a chest tube placed by IR on 4/24 and removed on 4/26  Re-Admitted 5/18 for profound hypoglycemia  5/20 CT chest showed discitis at T7-T8-T9 with progressive bony destruction + collapsed T8 and retropulsion of 8 mm 5/20 MRI thoracic spine at this time concerning for progressive discitis/osteomyelitis at T7-T9, extensive phlegmonous change in the paravertebral soft tissues, partial collapse of T8 vertebral body with 40% vertebral body height loss and approximately 6 mm bony retropulsion, moderate canal stenosis and mild cord edema.  5/22 ID reconsulted 5/24 palliative care consulted for pain management assistance 5/28  Dr. Hoyt Koch thoracic decompression T5-T11 posterior posterolateral arthrodesis with left T8 transpedicular decompression laminectomies and segmental instrumentation with screws etc.   Assessment and Plan:  Disseminated MRSA bacteremia with  T7-T9 discitis and osteomyelitis PICC line in place -ID recs:      -daptomycin until 6/25; then transition to oral doxycycline 100 mg po bid indefinitely.       -Re-engage with ID 48 hr before d/c.      -OP follow up with Dr Daiva Eves 6/24 at 3:45pm -Tylenol 1000 3 times daily Celebrex 100 twice daily Duragesic 50 mcg every 48 Oxy IR 5-10 every 4 as needed-Cymbalta 20 daily-can continue Robaxin-750 every 6 as needed spasm, Lyrica 75 at bedtime -Continue bowel regimen -Placement awaiting skilled nursing facility -D/w Neurosurgery Dr. Ihor Austin on 6/20 to address back wound, patient mobilizing fairly with therapy  Right foot osteomyelitis status post right fifth ray amputation 4/30 -Continue current antibiotic regimen. -WBAT -Will need outpatient follow-up with Dr. Felecia Jan seems clean  on the right side   Chronic systolic CHF Persistent A-fib CHADVASC >4 -Continue Lasix 20 daily, Coreg 3.125 twice daily, digoxin 125 daily -Blood pressure slightly elevated May increase Coreg depending on trends   Hypomagnesemia/hypokalemia Stable  -Replete as necessary--periodic labs   Acute metabolic encephalopathy 2/2 pain meds versus hyponatremia versus withdrawal. Mentation seems stable at this time  Moderate pleural effusion Stable on CT abdomen and pelvis on 09/14/2022. Respiratory status normal.  Repeat chest x-ray done on 6/14 shows some infiltrates however he is currently stable on room air and we will watch him   Constipation Improved -Continue laxatives as needed.   Postop acute blood loss anemia Stable. Started posttransfusion 1 unit PRBCs  -Continue to monitor with CBC -Transfusion threshold hemoglobin < 7.   AKI ?2/2 to prerenal azotemia secondary to hypotension and dehydration.   Hyponatremia Stable  -Different show includes secondary to volume overload versus poor oral intake. -Urine sodium less than 10, TSH within normal limits, serum osmolality 287,  serum cortisol  is 12. -Improving slowly but surely    Diabetes mellitus type 2 A1c 9.1 on 07/2022 CBGs 110-160 -Continue Semglee 20 units daily. -Continue SSI.   Left-sided abdominal pain with swelling CT abdomen pelvis with contrast on 09/04/2022 no acute abnormality noted small bilateral pleural effusions, bilateral lower lobe atelectasis or infiltrates, diffuse CAD, aortic atherosclerosis, left lower pole nonobstructing nephrolithiasis and anasarca throughout subcutaneous soft tissues.    Subjective:  Awake coherent in nad no focal deficit Walked with PT today--been out in chair No cp/fever n/v  Physical Exam:  Vitals:   09/22/22 0656 09/22/22 0845 09/22/22 1152 09/22/22 1629  BP:  (!) 154/92 (!) 142/82 (!) 158/92  Pulse:  63 73 65  Resp:  17 18   Temp:  97.6 F (36.4 C) 98.1 F (36.7 C) 97.7 F (36.5 C)  TempSrc:  Oral Oral Oral  SpO2:  94% 98% 98%  Weight: 87.3 kg     Height:       Eomi ncat no focal deficit S1 s2 no m/r/g RRR Cta b no added sound Abd soft nt nd  LE's are some swollen   Data Reviewed:  I have personally reviewed and interpreted labs, imaging.   CBC: Recent Labs  Lab 09/17/22 0326 09/18/22 0411  WBC 8.8 8.2  HGB 8.6* 8.9*  HCT 27.1* 28.1*  MCV 91.2 91.2  PLT 230 228    Basic Metabolic Panel: Recent Labs  Lab 09/17/22 0326 09/18/22 0411 09/21/22 0335  NA 129* 130* 132*  K 4.5 4.6 4.3  CL 98 98 94*  CO2 22 22 26   GLUCOSE 120* 140* 145*  BUN 15 14 12   CREATININE 1.36* 1.16 1.12  CALCIUM 8.6* 8.5* 8.9    Code Status:  Full code.  Code status decision has been confirmed with: patient  Family Communication: none at bedside   Rhetta Mura MD  09/22/2022 5:24 PM

## 2022-09-22 NOTE — Progress Notes (Addendum)
1302 hrs- patient requested pain medication, I offered him 10 mg and he took 5mg . Remainder was returned to pyxis.    1625 hrs- He requested remaining half of the dose and I gave him  oxycodone 5mg .  1730hrs he called requesting pain medication again and insisted that he is due to have 5mg . I informed patient that it would be too soon to get oxycodone again. Patient became very upset cussing at this RN. Patient was becoming very agitated and I asked the AD to go talk with the patient.  MD informed and the order was modified. AD in attendance and took time to educate  patient regarding pain medication orders.  At this moment patient also stated that he fell. He was asked if he fell onto the floor and he stated no. He said that he fell back into the chair  not on the floor.

## 2022-09-22 NOTE — Progress Notes (Signed)
Physical Therapy Treatment Patient Details Name: David [name] MRN: [id number] DOB: [birth year] Today's Date: 09/22/2022   History of Present Illness Pt is a 62 y.o. male who presented 08/20/22  with hypoglycemia. 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 5/28 surgical repair: T5-11 posterior/posterolateral arthrodesis  & Transpedicular decompression with partial corpectomy, left T8 & T8-9 laminectomy, bilateral facetectomy. 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 Carley Hammed walker used to allow pt more freedom of mvmt LE's and move closer to use of RW. Pt needed min A for power up to Carley Hammed and mod A from front of equipment to control mvmt as well as mod A behind pt to control LE placement and knee control. Pt continues to scissor and have B knee buckle with little to no proprioception BLE's. Pt ambulated 36' and 104' and was very encouraged by this increased distance. Decreased DOE compared to previous session, VSS. Pt with B foot swelling L>R. Patient will benefit from intensive inpatient follow up therapy, >3 hours/day. PT will continue to follow.   Recommendations for follow up therapy are one component of a multi-disciplinary discharge planning process, led by the attending physician.  Recommendations may be updated based on patient status, additional functional criteria and insurance authorization.  Follow Up Recommendations  Can patient physically be transported by private vehicle: No    Assistance Recommended at Discharge Frequent or constant Supervision/Assistance  Patient can return home with the following Assist for  transportation;Two people to help with walking and/or transfers;A lot of help with bathing/dressing/bathroom;Assistance with cooking/housework;Direct supervision/assist for financial management;Direct supervision/assist for medications management   Equipment Recommendations  Rolling walker (2 wheels);Wheelchair cushion (measurements PT);Wheelchair (measurements PT);BSC/3in1    Recommendations for Other Services Rehab consult     Precautions / Restrictions Precautions Precautions: Fall;Back Precaution Booklet Issued: No Required Braces or Orthoses: Spinal Brace Spinal Brace: Thoracolumbosacral orthotic;Applied in sitting position Other Brace: for use when ambulating. Restrictions Weight Bearing Restrictions: No RLE Weight Bearing: Weight bearing as tolerated RLE Partial Weight Bearing Percentage or Pounds: heel weight bearing Other Position/Activity Restrictions: with Darco shoe. Has not been using since amp was in 4/24     Mobility  Bed Mobility Overal bed mobility: Needs Assistance Bed Mobility: Supine to Sit     Supine to sit: Supervision, HOB elevated     General bed mobility comments: pt able to come to EOB with close supervision and increased time but no physical assist, keeps precautions    Transfers Overall transfer level: Needs assistance Equipment used:  Carley Hammed walker) Transfers: Sit to/from Stand Sit to Stand: Min assist     Squat pivot transfers: Min guard     General transfer comment: pt able to perform squat pivot to L with min guard, no physical assist, only guarding to make sure he doesn't scoot too far fwd into space between bed and chair. Min A needed to stand to [employer] walker    Ambulation/Gait Ambulation/Gait assistance: +2 physical assistance, Mod assist, +2 safety/equipment Gait Distance (Feet): 100 Feet (40', 60') Assistive device:  Carley Hammed) Gait Pattern/deviations: Decreased stride length, Decreased dorsiflexion - right, Decreased dorsiflexion - left,  Narrow  base of support, Ataxic, Scissoring, Trunk flexed, Step-through pattern, Knee hyperextension - right, Knee hyperextension - left Gait velocity: reduced Gait velocity interpretation: <1.31 ft/sec, indicative of household ambulator Pre-gait activities: wt shifting, stepping in place General Gait Details: pt with less knee buckling today than 6/18. Continues to have very narrow stance and B knee hyperextension. Mod for controlling mvmt of Carley Hammed as well as mod A behind pt to control at hips and decrease knee buckle   Stairs             Wheelchair Mobility    Modified Rankin (Stroke Patients Only)       Balance Overall balance assessment: Needs assistance Sitting-balance support: Bilateral upper extremity supported, Feet supported, No upper extremity supported Sitting balance-Leahy Scale: Fair Sitting balance - Comments: donned TLSO independently over head EOB as well as scooting to edge   Standing balance support: Bilateral upper extremity supported, During functional activity Standing balance-Leahy Scale: Poor Standing balance comment: heavy reliance on UE support ,LOB with knee buckle and occasional posterior lean                            Cognition Arousal/Alertness: Awake/alert Behavior During Therapy: WFL for tasks assessed/performed Overall Cognitive Status: Within Functional Limits for tasks assessed                         Following Commands: Follows multi-step commands with increased time     Problem Solving: Slow processing, Requires verbal cues          Exercises      General Comments General comments (skin integrity, edema, etc.): VSS on RA, pt's tolerance for standing activity improving. Increased swelling B feet, L>R      Pertinent Vitals/Pain Pain Assessment Pain Assessment: Faces Faces Pain Scale: Hurts little more Pain Location: lower part of incision and R lateral LE below the knee Pain Descriptors / Indicators:  Discomfort, Grimacing, Guarding, Cramping Pain Intervention(s): Limited activity within patient's tolerance, Monitored during session, Premedicated before session    Home Living                          Prior Function            PT Goals (current goals can now be found in the care plan section) Acute Rehab PT Goals Patient Stated Goal: to walk again PT Goal Formulation: With patient Time For Goal Achievement: 09/28/22 Potential to Achieve Goals: Good Progress towards PT goals: Progressing toward goals    Frequency    Min 5X/week      PT Plan Current plan remains appropriate    Co-evaluation              AM-PAC PT "6 Clicks" Mobility   Outcome Measure  Help needed turning from your back to your side while in a flat bed without using bedrails?: A Little Help needed moving from lying on your back to sitting on the side of a flat bed without using bedrails?: A Little Help needed moving to and from a bed to a chair (including a wheelchair)?: Total Help needed standing up from a chair using your arms (e.g., wheelchair or bedside chair)?: Total Help needed to walk in hospital room?: Total Help needed climbing 3-5 steps with a railing? : Total 6 Click Score: 10    End of Session Equipment Utilized During Treatment: Back brace;Gait belt  Activity Tolerance: Patient tolerated treatment well Patient left: in chair;with call bell/phone within reach;with chair alarm set Nurse Communication: Mobility status PT Visit Diagnosis: Difficulty in walking, not elsewhere classified (R26.2);Other abnormalities of gait and mobility (R26.89);Muscle weakness (generalized) (M62.81);Pain;Unsteadiness on feet (R26.81) Pain - Right/Left: Left Pain - part of body: Leg (back)     Time: 1610-9604 PT Time Calculation (min) (ACUTE ONLY): 35 min  Charges:  $Gait Training: 23-37 mins                     Lyanne Co, PT  Acute Rehab Services Secure chat preferred Office  [phone]    Lawana Chambers Nettie Wyffels 09/22/2022, 3:50 PM

## 2022-09-23 LAB — GLUCOSE, CAPILLARY
Glucose-Capillary: 102 mg/dL — ABNORMAL HIGH (ref 70–99)
Glucose-Capillary: 173 mg/dL — ABNORMAL HIGH (ref 70–99)
Glucose-Capillary: 128 mg/dL — ABNORMAL HIGH (ref 70–99)
Glucose-Capillary: 130 mg/dL — ABNORMAL HIGH (ref 70–99)

## 2022-09-23 NOTE — Progress Notes (Signed)
Physical Therapy Treatment Patient Details Name: David [name] MRN: [id number] DOB: [birth year] Today's Date: 09/23/2022   History of Present Illness Pt is a 62 y.o. male who presented 08/20/22  with hypoglycemia. 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 5/28 surgical repair: T5-11 posterior/posterolateral arthrodesis  & Transpedicular decompression with partial corpectomy, left T8 & T8-9 laminectomy, bilateral facetectomy. 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 This reduced speed and increased concentration to control his steps did result in him ambulating a decreased distance this date though. He continues to display bil knee instability through occasional buckling and hyperextension in standing, but overall is improving with less frequency. Pt is very motivated to participate and improve. Will continue to follow acutely.    Recommendations for follow up therapy are one component of a multi-disciplinary discharge planning process, led by the attending physician.  Recommendations may be updated based on patient status, additional functional criteria and insurance authorization.  Follow Up Recommendations  Can patient physically be transported by private vehicle: No    Assistance Recommended at Discharge Frequent or constant Supervision/Assistance  Patient can return home with the following Assist for transportation;Two people to help with walking and/or transfers;A lot of help with  bathing/dressing/bathroom;Assistance with cooking/housework;Direct supervision/assist for financial management;Direct supervision/assist for medications management   Equipment Recommendations  Rolling walker (2 wheels);Wheelchair cushion (measurements PT);Wheelchair (measurements PT);BSC/3in1    Recommendations for Other Services Rehab consult     Precautions / Restrictions Precautions Precautions: Fall;Back Precaution Booklet Issued: No Required Braces or Orthoses: Spinal Brace Spinal Brace: Thoracolumbosacral orthotic;Applied in sitting position Other Brace: for use when ambulating. Restrictions Weight Bearing Restrictions: No RLE Weight Bearing: Weight bearing as tolerated Other Position/Activity Restrictions: with Darco shoe. Has not been using since amp was in 4/24     Mobility  Bed Mobility Overal bed mobility: Needs Assistance Bed Mobility: Supine to Sit     Supine to sit: Supervision, HOB elevated     General bed mobility comments: pt able to come to EOB with close supervision and increased time but no physical assist, maintains precautions    Transfers Overall transfer level: Needs assistance Equipment used: 2 person hand held assist David [name] walker) Transfers: Sit to/from Stand, Bed to chair/wheelchair/BSC Sit to Stand: Min assist, +2 physical assistance, +2 safety/equipment     Squat pivot transfers: Mod assist, +2 physical assistance, +2 safety/equipment     General transfer comment: MinAx2 to power up to stand from EOB 1x to [facility] walker, from chair 1x to [facility] walker, and from recliner to sink 1x, providing tactile cues to extend hips and knees. ModAx2 for bil HHA squat pivot to bedside commode from recliner with bil knees blocked.    Ambulation/Gait Ambulation/Gait assistance: +2 physical assistance, Mod assist, +2 safety/equipment Gait Distance (Feet): 40 Feet (x2 bouts of ~12 ft > ~40 ft) Assistive device: David [name] Gait Pattern/deviations: Decreased stride  length, Decreased dorsiflexion - right, Decreased dorsiflexion - left, Narrow base of support, Ataxic, Scissoring, Trunk flexed, Step-through pattern, Knee hyperextension -  right, Knee hyperextension - left Gait velocity: reduced Gait velocity interpretation: <1.31 ft/sec, indicative of household ambulator   General Gait Details: Performed first gait bout with gloves spread out as targets for his feet to ambulate within room, encouraging wider stance and decreased speed with improved control, good success noted. Progressed to no targets to ambulate in hall, fiar carryover noted, but needing repeated cues to widen stance and tactile cues/assist to place feet and extend knees during stance phase. Noted continued buckling, but overall improving. Noted hyperextension as well. ModA of 1 for balance and LE management and modA of additional 1 to provide stability and Psychologist, forensic.   Stairs             Wheelchair Mobility    Modified Rankin (Stroke Patients Only)       Balance Overall balance assessment: Needs assistance Sitting-balance support: Bilateral upper extremity supported, Feet supported, No upper extremity supported Sitting balance-Leahy Scale: Fair Sitting balance - Comments: donned TLSO over head EOB as well as scooting to edge   Standing balance support: Bilateral upper extremity supported, During functional activity, Single extremity supported Standing balance-Leahy Scale: Poor Standing balance comment: Able to lift either UE to perform tasks at sink with min-modAx2 for stability, improved trunk control. Heavy reliance on UEs to ambulate                            Cognition Arousal/Alertness: Awake/alert Behavior During Therapy: WFL for tasks assessed/performed Overall Cognitive Status: Within Functional Limits for tasks assessed                         Following Commands: Follows multi-step commands with increased time     Problem  Solving: Slow processing, Requires verbal cues General Comments: Improved concentration and attention to controlling his steps when ambulating today        Exercises      General Comments        Pertinent Vitals/Pain Pain Assessment Pain Assessment: Faces Faces Pain Scale: Hurts little more Pain Location: back Pain Descriptors / Indicators: Discomfort, Grimacing, Guarding Pain Intervention(s): Monitored during session, Limited activity within patient's tolerance, Repositioned, Premedicated before session    Home Living                          Prior Function            PT Goals (current goals can now be found in the care plan section) Acute Rehab PT Goals Patient Stated Goal: to walk again PT Goal Formulation: With patient Time For Goal Achievement: 09/28/22 Potential to Achieve Goals: Good Progress towards PT goals: Progressing toward goals    Frequency    Min 5X/week      PT Plan Current plan remains appropriate    Co-evaluation PT/OT/SLP Co-Evaluation/Treatment: Yes Reason for Co-Treatment: Complexity of the patient's impairments (multi-system involvement);For patient/therapist safety;To address functional/ADL transfers PT goals addressed during session: Mobility/safety with mobility;Balance;Proper use of DME        AM-PAC PT "6 Clicks" Mobility   Outcome Measure  Help needed turning from your back to your side while in a flat bed without using bedrails?: A Little Help needed moving from lying on your back to sitting on the side of a flat bed without using bedrails?: A Little Help needed moving to and from a bed to a chair (including a wheelchair)?: Total  Help needed standing up from a chair using your arms (e.g., wheelchair or bedside chair)?: Total Help needed to walk in hospital room?: Total Help needed climbing 3-5 steps with a railing? : Total 6 Click Score: 10    End of Session Equipment Utilized During Treatment: Back brace;Gait  belt Activity Tolerance: Patient tolerated treatment well Patient left: with call bell/phone within reach;Other (comment) (on commode) Nurse Communication: Mobility status;Other (comment) (pt location) PT Visit Diagnosis: Difficulty in walking, not elsewhere classified (R26.2);Other abnormalities of gait and mobility (R26.89);Muscle weakness (generalized) (M62.81);Pain;Unsteadiness on feet (R26.81) Pain - Right/Left: Left Pain - part of body: Leg (back)     Time: 1345-1420 PT Time Calculation (min) (ACUTE ONLY): 35 min  Charges:  $Gait Training: 8-22 mins                     Raymond Gurney, PT, DPT Acute Rehabilitation Services  Office: [phone]    Jewel Baize 09/23/2022, 2:41 PM

## 2022-09-23 NOTE — Progress Notes (Signed)
Occupational Therapy Treatment Patient Details Name: David [name] MRN: [id number] DOB: February 17, 1961 Today's Date: 09/23/2022   History of present illness Pt is a 62 y.o. male who presented 08/20/22  with hypoglycemia. 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 5/28 surgical repair: T5-11 posterior/posterolateral arthrodesis  & Transpedicular decompression with partial corpectomy, left T8 & T8-9 laminectomy, bilateral facetectomy. 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 Pt able to manage TLSO brace mgmt and some LB dressing aspects EOB/bed level with Setup assist. Trialed various strategies to compensate for decreased BLE proprioception during mobility with Mod A x 2 and use of Eva walker though noted pt difficulty controlling this device. Facilitated standing balance/alternating UE support standing at sink with further improvements noted here though at high risk for falls due to BLE instability.    Recommendations for follow up therapy are one component of a multi-disciplinary discharge planning process, led by the attending physician.  Recommendations may be updated based on patient status, additional functional criteria and insurance authorization.    Assistance Recommended at Discharge Frequent or constant Supervision/Assistance  Patient can return home with the following  Two people to help with walking and/or transfers;Two people to help with bathing/dressing/bathroom   Equipment Recommendations  BSC/3in1;Wheelchair (measurements OT);Wheelchair cushion (measurements OT)    Recommendations  for Other Services Rehab consult    Precautions / Restrictions Precautions Precautions: Fall;Back Precaution Booklet Issued: No Required Braces or Orthoses: Spinal Brace Spinal Brace: Thoracolumbosacral orthotic;Applied in sitting position Other Brace: for use when ambulating. Restrictions Weight Bearing Restrictions: Yes RLE Weight Bearing: Weight bearing as tolerated Other Position/Activity Restrictions: with Darco shoe. Has not been using since amp was in 4/24       Mobility Bed Mobility Overal bed mobility: Needs Assistance Bed Mobility: Supine to Sit     Supine to sit: Supervision, HOB elevated     General bed mobility comments: pt able to come to EOB with close supervision and increased time but no physical assist, maintains precautions    Transfers Overall transfer level: Needs assistance Equipment used: 2 person hand held assist Carley Hammed walker) Transfers: Sit to/from Stand, Bed to chair/wheelchair/BSC Sit to Stand: Min assist, +2 physical assistance, +2 safety/equipment   Squat pivot transfers: Mod assist, +2 physical assistance, +2 safety/equipment       General transfer comment: MinAx2 to power up to stand from EOB 1x to [facility] walker, from chair 1x to [facility] walker, and from recliner to sink 1x, providing tactile cues to extend hips and knees. ModAx2 for bil HHA squat pivot to bedside commode from recliner with bil knees blocked.     Balance Overall balance assessment: Needs assistance Sitting-balance support: Bilateral upper extremity supported, Feet supported, No upper extremity supported Sitting balance-Leahy Scale: Fair Sitting balance - Comments: donned TLSO over head EOB as well as scooting to edge   Standing balance support: Bilateral upper extremity supported, During functional activity, Single extremity supported Standing balance-Leahy Scale: Poor Standing balance comment: Able to lift either UE to perform tasks at sink with min-modAx2 for stability,  improved trunk control. Heavy reliance on UEs to ambulate  ADL either performed or assessed with clinical judgement   ADL Overall ADL's : Needs assistance/impaired     Grooming: Moderate assistance;Standing;Minimal assistance Grooming Details (indicate cue type and reason): shaving head w/ electric trimmer standing at sink briefly. practice alternating one UE support (increased L lateral lean and kyphotic posture when using R UE for tasks vs L hand). able to briefly stand 3 seconds without BUE support - improved from prior trial within [facility]         Upper Body Dressing : Set up;Sitting Upper Body Dressing Details (indicate cue type and reason): able to don TLSO brace with Setup without IV connected Lower Body Dressing: Set up;Bed level Lower Body Dressing Details (indicate cue type and reason): donning socks bringing feet to self bed level Toilet Transfer: Moderate assistance;+2 for safety/equipment;+2 for physical assistance;Squat-pivot;BSC/3in1 Toilet Transfer Details (indicate cue type and reason): from recliner to [facility] after session due to need to attempt for BM.           General ADL Comments: Focus on Eva walker with mobility, strategies to attempt to compensate for decreased proprioception of B feet    Extremity/Trunk Assessment Upper Extremity Assessment Upper Extremity Assessment: Overall WFL for tasks assessed   Lower Extremity Assessment Lower Extremity Assessment: Defer to PT evaluation        Vision   Vision Assessment?: No apparent visual deficits   Perception     Praxis      Cognition Arousal/Alertness: Awake/alert Behavior During Therapy: WFL for tasks assessed/performed Overall Cognitive Status: Within Functional Limits for tasks assessed                         Following Commands: Follows multi-step commands with increased time     Problem Solving: Slow processing, Requires verbal cues General  Comments: Improved concentration and attention to controlling his steps when ambulating today        Exercises      Shoulder Instructions       General Comments      Pertinent Vitals/ Pain       Pain Assessment Pain Assessment: Faces Faces Pain Scale: Hurts little more Pain Location: back Pain Descriptors / Indicators: Discomfort, Grimacing, Guarding Pain Intervention(s): Monitored during session  Home Living                                          Prior Functioning/Environment              Frequency  Min 2X/week        Progress Toward Goals  OT Goals(current goals can now be found in the care plan section)  Progress towards OT goals: Progressing toward goals  Acute Rehab OT Goals Patient Stated Goal: walk out of here OT Goal Formulation: With patient Time For Goal Achievement: 09/26/22 Potential to Achieve Goals: Fair ADL Goals Pt Will Perform Grooming: Independently;standing Pt Will Perform Lower Body Bathing: with mod assist;sit to/from stand;with adaptive equipment Pt Will Perform Lower Body Dressing: with mod assist;sit to/from stand;with adaptive equipment Pt Will Transfer to Toilet: with mod assist;stand pivot transfer;bedside commode Pt Will Perform Toileting - Clothing Manipulation and hygiene: with modified independence;sit to/from stand Additional ADL Goal #1: Pt to increase standing tolerance > 3 min during functional tasks to decrease fall risk during LB ADLs  Plan Discharge plan remains appropriate    Co-evaluation  PT/OT/SLP Co-Evaluation/Treatment: Yes Reason for Co-Treatment: Complexity of the patient's impairments (multi-system involvement);For patient/therapist safety;To address functional/ADL transfers PT goals addressed during session: Mobility/safety with mobility;Balance;Proper use of DME OT goals addressed during session: ADL's and self-care;Proper use of Adaptive equipment and DME      AM-PAC OT "6 Clicks"  Daily Activity     Outcome Measure   Help from another person eating meals?: None Help from another person taking care of personal grooming?: A Little Help from another person toileting, which includes using toliet, bedpan, or urinal?: Total Help from another person bathing (including washing, rinsing, drying)?: A Lot Help from another person to put on and taking off regular upper body clothing?: A Lot Help from another person to put on and taking off regular lower body clothing?: A Little 6 Click Score: 15    End of Session Equipment Utilized During Treatment: Gait belt;Back brace  OT Visit Diagnosis: Unsteadiness on feet (R26.81);Other abnormalities of gait and mobility (R26.89);Muscle weakness (generalized) (M62.81);Pain   Activity Tolerance Patient tolerated treatment well   Patient Left with call bell/phone within reach;Other (comment);with nursing/sitter in room (on [facility])   Nurse Communication Mobility status        Time: 1330-1420 OT Time Calculation (min): 50 min  Charges: OT General Charges $OT Visit: 1 Visit OT Treatments $Self Care/Home Management : 8-22 mins  Bradd Canary, OTR/L Acute Rehab Services Office: [phone]   Lorre Munroe 09/23/2022, 2:51 PM

## 2022-09-23 NOTE — Progress Notes (Signed)
Patient seen in hallway working with therapy in good spirits Full eval to follow in a.m. We will ensure that he understands that we need to consolidate his meds to a scheduled amount-should use Tylenol in between.   Pleas Koch, MD [name] 4:54 PM

## 2022-09-24 DIAGNOSIS — E162 Hypoglycemia, unspecified: Secondary | ICD-10-CM | POA: Diagnosis not present

## 2022-09-24 LAB — GLUCOSE, CAPILLARY
Glucose-Capillary: 116 mg/dL — ABNORMAL HIGH (ref 70–99)
Glucose-Capillary: 168 mg/dL — ABNORMAL HIGH (ref 70–99)
Glucose-Capillary: 129 mg/dL — ABNORMAL HIGH (ref 70–99)
Glucose-Capillary: 143 mg/dL — ABNORMAL HIGH (ref 70–99)

## 2022-09-24 MED ORDER — TAMSULOSIN HCL 0.4 MG PO CAPS
0.4000 mg | ORAL_CAPSULE | Freq: Every day | ORAL | Status: DC
  Administered 2022-09-24 – 2022-10-27 (×34): 0.4 mg via ORAL
  Filled 2022-09-24 (×35): qty 1

## 2022-09-24 MED ORDER — CLONIDINE HCL 0.1 MG PO TABS: 0.1000 mg | ORAL_TABLET | Freq: Two times a day (BID) | ORAL | Status: DC | PRN

## 2022-09-24 MED ORDER — FINASTERIDE 5 MG PO TABS
5.0000 mg | ORAL_TABLET | Freq: Every day | ORAL | Status: DC
  Administered 2022-09-24 – 2022-10-28 (×35): 5 mg via ORAL
  Filled 2022-09-24 (×35): qty 1

## 2022-09-24 NOTE — Consult Note (Signed)
WOC Nurse Consult Note: Reason for Consult:distal end of back incision with opening (dehiscence). Neurosurgery has seen and no surgical intervention is planned. Topical care guidance is requested. Photo is provided by Dr. Mahala Menghini and appreciated. Wound type:surgical Pressure Injury POA: N/A Wound bed: yellow slough Drainage (amount, consistency, odor) small light yellow Periwound: mild erythema Dressing procedure/placement/frequency:I have provided nursing with guidance for the topical care of this wound using a daily cleanse followed by painting the affected area with a povidone-iodine swabstick. This will provide astringent properties as well as antimicrobial donation. After allowing this to air-dry, the areas is to be covered with dry gauze and secured with a silicone foam dressing.  Nursing and patient/caregiver to report any signs and symptoms of wound infection such as fever, increased pain or drainage, increased erythema or edema at the site to the neurosurgeon/neurosurgery team.   Recommend follow up with neurosurgery as directed.  WOC nursing team will not follow, but will remain available to this patient, the nursing and medical teams.  Please re-consult if needed.  Thank you for inviting [place] to participate in this patient's Plan of Care.  Ladona Mow, MSN, RN, CNS, GNP, Leda Min, [employer], [employer] phone:  [phone]

## 2022-09-24 NOTE — Progress Notes (Signed)
Pt seen/examined. There is an area of superficial wound dehiscence to the inferior portion of incision. Minimal drainage, pt afebrile. Wound care consult placed. No neurosurgical intervention indicated at this time.   Call w/ questions/concerns.  Patrici Ranks, [facility]

## 2022-09-24 NOTE — Progress Notes (Signed)
PROGRESS NOTE   David [name]  ZOX:[id number] DOB: 03/18/1961 DOA: 08/20/2022 PCP: [name], MD   Date of Service: the patient was seen and examined on 09/24/2022  Brief Narrative:  62 y.o. WM [[facility]ef at [facility] x 4 yrs] Prior disseminated MRSA bacteremia with T7-T9 discitis, right foot osteomyelitis s/p right fifth ray amputation on 4/30, PAD s/p angioplasty by Dr. Chestine Spore on 4/25  persistent A-fib on Eliquis CHad2vasc2>3 since 09/2021  DM-2, seizure disorder, HTN, HLD and chronic pain r Return to ED with hypoglycemia and excess opiate intake.  Hospitalized for disseminated MRSA bacteremia from 4/9-5/17--hospitalization interspersed with AKI-angio plasty tibioperoneal trunk + peroneal Dr. Chestine Spore 4/25 4/30 underwent R fifth ray revision amputation-MRI 08/08/2022 showed persistent osteomyelitis T7-T9 with involvement of T7 vertebrae compared to prior patient was changed to oral antibiotics after completing 5 weeks of vancyomycin,then daptomycin. discharged on p.o. doxycycline-- Patient developed at that time bilateral pneumonia with a chest tube placed by IR on 4/24 and removed on 4/26  Re-Admitted 5/18 for profound hypoglycemia  5/20 CT chest showed discitis at T7-T8-T9 with progressive bony destruction + collapsed T8 and retropulsion of 8 mm 5/20 MRI thoracic spine at this time concerning for progressive discitis/osteomyelitis at T7-T9, extensive phlegmonous change in the paravertebral soft tissues, partial collapse of T8 vertebral body with 40% vertebral body height loss and approximately 6 mm bony retropulsion, moderate canal stenosis and mild cord edema.  5/22 ID reconsulted 5/24 palliative care consulted for pain management assistance 5/28  Dr. Hoyt Koch thoracic decompression T5-T11 posterior posterolateral arthrodesis with left T8 transpedicular decompression laminectomies and segmental instrumentation with screws etc.   Assessment and Plan:  Disseminated MRSA bacteremia with  T7-T9 discitis and osteomyelitis PICC line in place -ID recs:      -daptomycin until 6/25; then transition to oral doxycycline 100 mg po bid indefinitely.       -Re-engage with ID 48 hr before d/c.      -OP follow up with Dr Daiva Eves 6/24 at 3:45pm -Tylenol 1000 3 times daily Celebrex 100 twice daily Duragesic 50 mcg every 48 Oxy IR 5-10 every 4 as needed-Cymbalta 20 daily-can continue Robaxin-750 every 6 as needed spasm, Lyrica 75 at bedtime -Continue bowel regimen -Placement awaiting skilled nursing facility -D/w Neurosurgery Dr. Ihor Austin on 6/20 and appreciate Ms. Tomlinson's input-wound care to follow when able  Right foot osteomyelitis status post right fifth ray amputation 4/30 -Continue current antibiotic regimen. -WBAT -Will need outpatient follow-up with Dr. Felecia Jan seems clean  on the right side   Urinary retention Start Flomax 0.4, finasteride 5 mg and try to liberate from Foley catheter on 6/24 AM otherwise will need outpatient urologist input  Chronic systolic CHF Persistent A-fib CHADVASC >4 -Continue Lasix 20 daily, Coreg 3.125 twice daily, digoxin 125 daily---Home Sherryll Burger has been held from admission and we may reinitiate this based on labs in the next several days -Previously was on Coreg 6.25 at home   Hypomagnesemia/hypokalemia Stable  -Replete as necessary--periodic labs   Acute metabolic encephalopathy 2/2 pain meds versus hyponatremia versus withdrawal. Mentation seems stable at this time  Moderate pleural effusion Stable on CT abdomen and pelvis on 09/14/2022. Respiratory status normal.  Repeat chest x-ray done on 6/14 shows some infiltrates however he is currently stable on room air and we will watch him   Constipation Improved -Continue laxatives as needed.   Postop acute blood loss anemia Stable. Started posttransfusion 1 unit PRBCs  -Continue to monitor with CBC -Transfusion threshold hemoglobin <  7.   AKI ?2/2 to prerenal azotemia  secondary to hypotension and dehydration--- has resolved but will need labs in the next several days   Hyponatremia Stable  -Different show includes secondary to volume overload versus poor oral intake. -Urine sodium less than 10, TSH within normal limits, serum osmolality 287, serum cortisol is 12. -Improving slowly but surely    Diabetes mellitus type 2 A1c 9.1 on 07/2022 CBGs 110-160 -Continue Semglee 20 units daily. -Continue SSI.   Left-sided abdominal pain with swelling CT abdomen pelvis with contrast on 09/04/2022 no acute abnormality noted small bilateral pleural effusions, bilateral lower lobe atelectasis or infiltrates, diffuse CAD, aortic atherosclerosis, left lower pole nonobstructing nephrolithiasis and anasarca throughout subcutaneous soft tissues.    Subjective:  He is worried about the Foley catheter is sitting up in the chair I have discussed with him that weaning of opiates needs to occur on 6/24 and he understands and concurs  Physical Exam:  Vitals:   09/24/22 0503 09/24/22 0813 09/24/22 1152 09/24/22 1710  BP: (!) 125/97 (!) 181/90 138/74 (!) 141/83  Pulse: 62 69 (!) 59 82  Resp: 18 17 16 17   Temp: 98.4 F (36.9 C) 97.7 F (36.5 C) 98.2 F (36.8 C) 98.7 F (37.1 C)  TempSrc: Oral Oral Oral Oral  SpO2: 98% 92% 99% 97%  Weight:      Height:       Eomi ncat no focal deficit S1 s2 no m/r/g RRR  Cta b no added sound no wheeze rales rhonchi Abd soft nt nd-indwelling Foley is in place LE's are some swollen and he is wearing stockings now   Data Reviewed:  I have personally reviewed and interpreted labs, imaging.   CBC: Recent Labs  Lab 09/18/22 0411  WBC 8.2  HGB 8.9*  HCT 28.1*  MCV 91.2  PLT 228    Basic Metabolic Panel: Recent Labs  Lab 09/18/22 0411 09/21/22 0335  NA 130* 132*  K 4.6 4.3  CL 98 94*  CO2 22 26  GLUCOSE 140* 145*  BUN 14 12  CREATININE 1.16 1.12  CALCIUM 8.5* 8.9    Code Status:  Full code.  Code status decision  has been confirmed with: patient  Family Communication: none at bedside   Rhetta Mura MD  09/24/2022 6:02 PM

## 2022-09-25 ENCOUNTER — Encounter: Payer: Self-pay | Admitting: Infectious Disease

## 2022-09-25 DIAGNOSIS — M4644 Discitis, unspecified, thoracic region: Secondary | ICD-10-CM

## 2022-09-25 DIAGNOSIS — T847XXA Infection and inflammatory reaction due to other internal orthopedic prosthetic devices, implants and grafts, initial encounter: Secondary | ICD-10-CM | POA: Insufficient documentation

## 2022-09-25 DIAGNOSIS — E162 Hypoglycemia, unspecified: Secondary | ICD-10-CM | POA: Diagnosis not present

## 2022-09-25 HISTORY — DX: Infection and inflammatory reaction due to other internal orthopedic prosthetic devices, implants and grafts, initial encounter: T84.7XXA

## 2022-09-25 HISTORY — DX: Discitis, unspecified, thoracic region: M46.44

## 2022-09-25 LAB — GLUCOSE, CAPILLARY
Glucose-Capillary: 135 mg/dL — ABNORMAL HIGH (ref 70–99)
Glucose-Capillary: 162 mg/dL — ABNORMAL HIGH (ref 70–99)
Glucose-Capillary: 141 mg/dL — ABNORMAL HIGH (ref 70–99)
Glucose-Capillary: 149 mg/dL — ABNORMAL HIGH (ref 70–99)

## 2022-09-25 LAB — BASIC METABOLIC PANEL
Anion gap: 8 (ref 5–15)
BUN: 13 mg/dL (ref 6–20)
CO2: 25 mmol/L (ref 22–32)
Calcium: 9.1 mg/dL (ref 8.9–10.3)
Chloride: 98 mmol/L (ref 98–111)
Creatinine, Ser: 1.24 mg/dL (ref 0.61–1.24)
GFR, Estimated: >60 mL/min (ref >60)
Glucose, Bld: 145 mg/dL — ABNORMAL HIGH (ref 70–99)
Potassium: 4 mmol/L (ref 3.5–5.1)
Sodium: 131 mmol/L — ABNORMAL LOW (ref 135–145)

## 2022-09-25 MED ORDER — ALTEPLASE 2 MG IJ SOLR
2.0000 mg | Freq: Once | INTRAMUSCULAR | Status: AC
  Administered 2022-09-25: 2 mg
  Filled 2022-09-25: qty 2

## 2022-09-25 NOTE — Plan of Care (Signed)
Problem: Education: Goal: Understanding of CV disease, CV risk reduction, and recovery process will improve Outcome: Progressing Goal: Individualized Educational Video(s) Outcome: Progressing   Problem: Activity: Goal: Ability to return to baseline activity level will improve Outcome: Progressing   Problem: Cardiovascular: Goal: Ability to achieve and maintain adequate cardiovascular perfusion will improve Outcome: Progressing Goal: Vascular access site(s) Level 0-1 will be maintained Outcome: Progressing   Problem: Health Behavior/Discharge Planning: Goal: Ability to safely manage health-related needs after discharge will improve Outcome: Progressing   Problem: Education: Goal: Knowledge of General Education information will improve Description: Including pain rating scale, medication(s)/side effects and non-pharmacologic comfort measures Outcome: Progressing   Problem: Health Behavior/Discharge Planning: Goal: Ability to manage health-related needs will improve Outcome: Progressing   Problem: Clinical Measurements: Goal: Ability to maintain clinical measurements within normal limits will improve Outcome: Progressing Goal: Will remain free from infection Outcome: Progressing Goal: Diagnostic test results will improve Outcome: Progressing Goal: Respiratory complications will improve Outcome: Progressing Goal: Cardiovascular complication will be avoided Outcome: Progressing   Problem: Activity: Goal: Risk for activity intolerance will decrease Outcome: Progressing   Problem: Nutrition: Goal: Adequate nutrition will be maintained Outcome: Progressing   Problem: Coping: Goal: Level of anxiety will decrease Outcome: Progressing   Problem: Elimination: Goal: Will not experience complications related to bowel motility Outcome: Progressing Goal: Will not experience complications related to urinary retention Outcome: Progressing   Problem: Pain Managment: Goal:  General experience of comfort will improve Outcome: Progressing   Problem: Safety: Goal: Ability to remain free from injury will improve Outcome: Progressing   Problem: Skin Integrity: Goal: Risk for impaired skin integrity will decrease Outcome: Progressing   Problem: Education: Goal: Ability to describe self-care measures that may prevent or decrease complications (Diabetes Survival Skills Education) will improve Outcome: Progressing Goal: Individualized Educational Video(s) Outcome: Progressing   Problem: Coping: Goal: Ability to adjust to condition or change in health will improve Outcome: Progressing   Problem: Fluid Volume: Goal: Ability to maintain a balanced intake and output will improve Outcome: Progressing   Problem: Health Behavior/Discharge Planning: Goal: Ability to identify and utilize available resources and services will improve Outcome: Progressing Goal: Ability to manage health-related needs will improve Outcome: Progressing   Problem: Metabolic: Goal: Ability to maintain appropriate glucose levels will improve Outcome: Progressing   Problem: Nutritional: Goal: Maintenance of adequate nutrition will improve Outcome: Progressing Goal: Progress toward achieving an optimal weight will improve Outcome: Progressing   Problem: Skin Integrity: Goal: Risk for impaired skin integrity will decrease Outcome: Progressing   Problem: Tissue Perfusion: Goal: Adequacy of tissue perfusion will improve Outcome: Progressing   Problem: Education: Goal: Ability to verbalize activity precautions or restrictions will improve Outcome: Progressing Goal: Knowledge of the prescribed therapeutic regimen will improve Outcome: Progressing Goal: Understanding of discharge needs will improve Outcome: Progressing   Problem: Activity: Goal: Ability to avoid complications of mobility impairment will improve Outcome: Progressing Goal: Ability to tolerate increased activity  will improve Outcome: Progressing Goal: Will remain free from falls Outcome: Progressing   Problem: Bowel/Gastric: Goal: Gastrointestinal status for postoperative course will improve Outcome: Progressing   Problem: Clinical Measurements: Goal: Ability to maintain clinical measurements within normal limits will improve Outcome: Progressing Goal: Postoperative complications will be avoided or minimized Outcome: Progressing Goal: Diagnostic test results will improve Outcome: Progressing   Problem: Pain Management: Goal: Pain level will decrease Outcome: Progressing   Problem: Skin Integrity: Goal: Will show signs of wound healing Outcome: Progressing   Problem: Health Behavior/Discharge   Planning: Goal: Identification of resources available to assist in meeting health care needs will improve Outcome: Progressing   Problem: Bladder/Genitourinary: Goal: Urinary functional status for postoperative course will improve Outcome: Progressing   

## 2022-09-25 NOTE — Progress Notes (Deleted)
Subjective:    Patient ID: David [name], male    DOB: 09-13-1960, 62 y.o.   MRN: [id number]  HPI   David [name] is a 62 y.o. 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 Yetta Barre did not see indication for Neurosurgical intervention    We have had him on daptomycin to cover the majority of his MRSA infection with doxycyline to have activity in the lungs.   Looking back I can see that he DID grow an actinomyces species from his foot  He completed above 5 weeks antibiotics of vancomycin and then Dapto and transition to doxycycline alone on 5/15.  He was then readmitted with opioid overdose.   - Repeat MRI on 5/20 showed progressive discitis/osteomyelitis at T7-T9, partial collapse of vertebral body 40% body with height loss at T8, 10 ventral epidural phlegmon T7-T9, extensive phlegmonous changes the paravertebral soft tissue additionally involve the mid thoracic levels.  Bilateral pleural effusions complex on the right.  5/28 s/p extensive thoracic I&D, decompression, spinal stabilization hardware   Operative cultures yielded MRSA S to tetracycline but R to bactrim.   daptomycin until 6/25; then transition to oral doxycycline 100 mg po bid indefinitely.   Past Medical History:  Diagnosis Date   Cardiomyopathy (HCC)    a. EF 45% in 2019.   CHF (congestive heart failure) (HCC)    Chronic anticoagulation 09/30/2017    Diabetes mellitus type 2 in nonobese [facility])    Does not have health insurance    Essential hypertension 09/26/2017   Financial difficulties    H/O noncompliance with medical treatment, presenting hazards to health    Non-insulin treated type 2 diabetes mellitus (HCC) 09/26/2017   Persistent atrial fibrillation (HCC)    Sleep apnea suspected 09/30/2017    Past Surgical History:  Procedure Laterality Date   ABDOMINAL AORTOGRAM W/LOWER EXTREMITY N/A 09/02/2021   Procedure: ABDOMINAL AORTOGRAM W/LOWER EXTREMITY;  Surgeon: Nada Libman, MD;  [facility]: [facility];  Service: Cardiovascular;  Laterality: N/A;   ABDOMINAL AORTOGRAM W/LOWER EXTREMITY N/A 12/28/2021   Procedure: ABDOMINAL AORTOGRAM W/LOWER EXTREMITY;  Surgeon: Nada Libman, MD;  Location: [facility];  Service: Cardiovascular;  Laterality: N/A;   ABDOMINAL AORTOGRAM W/LOWER EXTREMITY N/A 07/28/2022   Procedure: ABDOMINAL AORTOGRAM W/LOWER EXTREMITY;  Surgeon: Cephus Shelling, MD;  Location: [facility];  Service: Cardiovascular;  Laterality: N/A;   AMPUTATION Right 09/04/2021   Procedure: 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;  Surgeon: Nadara Mustard, MD;  Location: [facility] OR;  Service: Orthopedics;  Laterality: Right;   AMPUTATION Right 08/02/2022   Procedure: AMPUTATION RAY 5TH METATARSAL OF RIGHT FOOT;  Surgeon: [name], MD;  Location: [facility] OR;  Service: Orthopedics;  Laterality: Right;   APPENDECTOMY  1971   BONE BIOPSY Right 08/02/2022   Procedure: BONE BIOPSY OF 5TH METATARSAL RIGHT FOOT;  Surgeon: [name], MD;  Location: [facility] OR;  Service: Orthopedics;  Laterality: Right;   CARDIOVERSION N/A 09/28/2017   Procedure: CARDIOVERSION;  Surgeon: Thurmon Fair, MD;  [facility]: [facility];  Service: Cardiovascular;  Laterality: N/A;   IRRIGATION AND DEBRIDEMENT FOOT  08/02/2022   Procedure: IRRIGATION AND DEBRIDEMENT RIGHT 5TH METATARSAL FOOT;  Surgeon: [name], MD;  Location: [facility];  Service:  Orthopedics;;   LAMINECTOMY WITH POSTERIOR LATERAL ARTHRODESIS LEVEL 4 N/A 08/30/2022   Procedure: THORACIC FIVE-THORACIC ELEVEN FUSION, THORACIC EIGHT TRANSPEDICULAR DECOMPRESSION AND PARTIAL CORPECTOMY with O-Arm;  Surgeon: Bedelia Person, MD;  Location: [facility] OR;  Service: Neurosurgery;  Laterality: N/A;   PERIPHERAL VASCULAR BALLOON ANGIOPLASTY  09/02/2021   Procedure: PERIPHERAL VASCULAR BALLOON ANGIOPLASTY;  Surgeon: Nada Libman, MD;  [facility];  Service: Cardiovascular;;   PERIPHERAL VASCULAR BALLOON ANGIOPLASTY  12/28/2021   Procedure: PERIPHERAL VASCULAR BALLOON ANGIOPLASTY;  Surgeon: Nada Libman, MD;  [facility];  Service: Cardiovascular;;  Posterior Tibial PTA only   TEE WITHOUT CARDIOVERSION N/A 09/28/2017   Procedure: TRANSESOPHAGEAL ECHOCARDIOGRAM (TEE);  Surgeon: Thurmon Fair, MD;  Location: [facility] ENDOSCOPY;  Service: Cardiovascular;  Laterality: N/A;   TEE WITHOUT CARDIOVERSION N/A 09/03/2021   Procedure: TRANSESOPHAGEAL ECHOCARDIOGRAM (TEE);  Surgeon: Chilton Si, MD;  Location: [facility] ENDOSCOPY;  Service: Cardiovascular;  Laterality: N/A;   TEE WITHOUT CARDIOVERSION N/A 07/15/2022   Procedure: TRANSESOPHAGEAL ECHOCARDIOGRAM;  Surgeon: Thurmon Fair, MD;  [facility];  Service: Cardiovascular;  Laterality: N/A;    Family History  Problem Relation Age of Onset   Hypertension Mother       Social History   Socioeconomic History   Marital status: Married    Spouse name: Not on file   Number of children: Not on file   Years of education: Not on file   Highest education level: Not on file  Occupational History   Not on file  Tobacco Use   Smoking status: Former    Years: 15    Types: Cigarettes    Quit date: 03/18/2018    Years since quitting: 4.5   Smokeless tobacco: Never   Tobacco comments:    09/26/2017 "2-3 cigarettes/month now"  Vaping Use   Vaping Use: Never used  Substance and Sexual Activity    Alcohol use: Not Currently    Alcohol/week: 3.0 standard drinks of alcohol    Types: 3 Cans of beer per week    Comment: States he quit drinking 8-9 months ago   Drug use: Never   Sexual activity: Not Currently  Other Topics Concern   Not on file  Social History Narrative   Not on file   Social Determinants of Health   Financial Resource Strain: Not on file  Food Insecurity: Patient Unable To Answer (08/23/2022)   Hunger Vital Sign    Worried About Running Out of Food in the Last Year: Patient unable to answer    Ran Out of Food in the Last Year: Patient unable to answer  Transportation Needs: Patient Unable To Answer (08/23/2022)   PRAPARE - Transportation    Lack of Transportation (Medical): Patient unable to answer    Lack of Transportation (Non-Medical): Patient unable to answer  Physical Activity: Not on file  Stress: Stress Concern Present (09/26/2017)   [employer] of Occupational Health - Occupational Stress Questionnaire    Feeling of Stress : Very much  Social Connections: Not on file    No Known Allergies  No current facility-administered medications for this visit. No current outpatient medications on  file.  Facility-Administered Medications Ordered in Other Visits:    0.9 %  sodium chloride infusion, 250 mL, Intravenous, Continuous, Patrici Ranks [place], PA-C, Last Rate: 1 mL/hr at 09/25/22 1625, 250 mL at 09/25/22 1625   0.9 %  sodium chloride infusion, , Intravenous, PRN, Rodolph Bong, MD, Stopped at 09/15/22 1353   acetaminophen (TYLENOL) tablet 1,000 mg, 1,000 mg, Oral, TID, Vann, Jessica U, DO, 1,000 mg at 09/25/22 1630   apixaban (ELIQUIS) tablet 5 mg, 5 mg, Oral, BID, Patrici Ranks [place], PA-C, 5 mg at 09/25/22 0827   atorvastatin (LIPITOR) tablet 80 mg, 80 mg, Oral, Daily, Bobette Mo, MD, 80 mg at 09/25/22 0827   bisacodyl (DULCOLAX) suppository 10 mg, 10 mg, Rectal, Daily PRN, Kathlen Mody, MD, 10 mg at 09/03/22 1000   busPIRone  (BUSPAR) tablet 15 mg, 15 mg, Oral, BID, Bobette Mo, MD, 15 mg at 09/25/22 0827   carvedilol (COREG) tablet 3.125 mg, 3.125 mg, Oral, BID, Kathlen Mody, MD, 3.125 mg at 09/25/22 0827   celecoxib (CELEBREX) capsule 100 mg, 100 mg, Oral, BID, Anderson Malta L, DO, 100 mg at 09/25/22 0827   Chlorhexidine Gluconate Cloth 2 % PADS 6 each, 6 each, Topical, Daily, Kathlen Mody, MD, 6 each at 09/24/22 1035   cloNIDine (CATAPRES) tablet 0.1 mg, 0.1 mg, Oral, BID PRN, Rhetta Mura, MD   DAPTOmycin (CUBICIN) 650 mg in sodium chloride 0.9 % IVPB, 8 mg/kg, Intravenous, Q2000, Vu, Trung T, MD, Last Rate: 126 mL/hr at 09/25/22 1629, 650 mg at 09/25/22 1629   digoxin (LANOXIN) tablet 125 mcg, 125 mcg, Oral, Daily, Bobette Mo, MD, 125 mcg at 09/25/22 0827   [START ON 09/28/2022] doxycycline (VIBRA-TABS) tablet 100 mg, 100 mg, Oral, Q12H, Vu, Trung T, MD   DULoxetine (CYMBALTA) DR capsule 20 mg, 20 mg, Oral, Daily, Anderson Malta L, DO, 20 mg at 09/25/22 0827   famotidine (PEPCID) tablet 20 mg, 20 mg, Oral, BID, Bobette Mo, MD, 20 mg at 09/25/22 1610   feeding supplement (ENSURE ENLIVE / ENSURE PLUS) liquid 237 mL, 237 mL, Oral, BID BM, Gonfa, Taye T, MD, 237 mL at 09/25/22 0829   fentaNYL (DURAGESIC) 50 MCG/HR 1 patch, 1 patch, Transdermal, Q48H, Anderson Malta L, DO, 1 patch at 09/23/22 2304   finasteride (PROSCAR) tablet 5 mg, 5 mg, Oral, Daily, Samtani, Jai-Gurmukh, MD, 5 mg at 09/25/22 0827   fluticasone (FLONASE) 50 MCG/ACT nasal spray 1 spray, 1 spray, Each Nare, Daily, Vann, Jessica U, DO, 1 spray at 09/25/22 9604   furosemide (LASIX) tablet 20 mg, 20 mg, Oral, Daily, Lurene Shadow, MD, 20 mg at 09/25/22 0827   insulin glargine-yfgn (SEMGLEE) injection 20 Units, 20 Units, Subcutaneous, Daily, Kathlen Mody, MD, 20 Units at 09/25/22 0828   lidocaine (LIDODERM) 5 % 1 patch, 1 patch, Transdermal, Q24H, Vann, Jessica U, DO, 1 patch at 09/25/22 0826   lidocaine  (XYLOCAINE) 1 % (with pres) injection 20 mL, 20 mL, Intradermal, Once, Richarda Overlie, MD   magnesium oxide (MAG-OX) tablet 400 mg, 400 mg, Oral, BID, Anderson Malta L, DO, 400 mg at 09/25/22 0827   methocarbamol (ROBAXIN) tablet 750 mg, 750 mg, Oral, Q6H PRN, Anderson Malta L, DO, 750 mg at 09/25/22 1233   ondansetron (ZOFRAN) tablet 4 mg, 4 mg, Oral, Q6H PRN, 4 mg at 09/21/22 0656 **OR** ondansetron (ZOFRAN) injection 4 mg, 4 mg, Intravenous, Q6H PRN, Bobette Mo, MD, 4 mg at 09/25/22 0032   Oral care mouth rinse, 15 mL,  Mouth Rinse, PRN, Bobette Mo, MD   oxyCODONE (Oxy IR/ROXICODONE) immediate release tablet 5-10 mg, 5-10 mg, Oral, Q4H PRN, Rhetta Mura, MD, 10 mg at 09/25/22 1630   pantoprazole (PROTONIX) EC tablet 40 mg, 40 mg, Oral, Daily, Bobette Mo, MD, 40 mg at 09/25/22 0827   polyethylene glycol (MIRALAX / GLYCOLAX) packet 17 g, 17 g, Oral, BID, Vann, Jessica U, DO, 17 g at 09/25/22 0829   pregabalin (LYRICA) capsule 75 mg, 75 mg, Oral, QHS, Golding, Elizabeth L, DO, 75 mg at 09/24/22 2148   senna-docusate (Senokot-S) tablet 1 tablet, 1 tablet, Oral, BID, Candelaria Stagers T, MD, 1 tablet at 09/25/22 0827   sodium chloride (OCEAN) 0.65 % nasal spray 1 spray, 1 spray, Each Nare, PRN, Lurene Shadow, MD   sodium chloride flush (NS) 0.9 % injection 10-40 mL, 10-40 mL, Intracatheter, Q12H, Kathlen Mody, MD, 20 mL at 09/25/22 1234   sodium chloride flush (NS) 0.9 % injection 10-40 mL, 10-40 mL, Intracatheter, PRN, Kathlen Mody, MD   sodium chloride flush (NS) 0.9 % injection 3 mL, 3 mL, Intravenous, Q12H, Patrici Ranks [place], PA-C, 3 mL at 09/24/22 2152   sodium chloride flush (NS) 0.9 % injection 3 mL, 3 mL, Intravenous, PRN, Patrici Ranks [place], PA-C, 3 mL at 09/19/22 1952   sodium phosphate (FLEET) 7-19 GM/118ML enema 1 enema, 1 enema, Rectal, Daily PRN, Kathlen Mody, MD   tamsulosin (FLOMAX) capsule 0.4 mg, 0.4 mg, Oral, QPC supper, Rhetta Mura, MD, 0.4 mg at 09/25/22 1836    Review of Systems     Objective:   Physical Exam        Assessment & Plan:

## 2022-09-25 NOTE — Progress Notes (Signed)
PROGRESS NOTE   Kristion Holifield  VZD:[id number] DOB: [birth year] DOA: 08/20/2022 PCP: [name], MD   Date of Service: the patient was seen and examined on 09/25/2022  Brief Narrative:  62 y.o. 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 Chestine Spore on 4/25  persistent A-fib on Eliquis CHad2vasc2>3 since 09/2021  DM-2, seizure disorder, HTN, HLD and chronic pain r Return to ED with hypoglycemia and excess opiate intake.  Hospitalized for disseminated MRSA bacteremia from 4/9-5/17--hospitalization interspersed with AKI-angio plasty tibioperoneal trunk + peroneal Dr. Chestine Spore 4/25 4/30 underwent R fifth ray revision amputation-MRI 08/08/2022 showed persistent osteomyelitis T7-T9 with involvement of T7 vertebrae compared to prior patient was changed to oral antibiotics after completing 5 weeks of vancyomycin,then daptomycin. discharged on p.o. doxycycline-- Patient developed at that time bilateral pneumonia with a chest tube placed by IR on 4/24 and removed on 4/26  Re-Admitted 5/18 for profound hypoglycemia  5/20 CT chest showed discitis at T7-T8-T9 with progressive bony destruction + collapsed T8 and retropulsion of 8 mm 5/20 MRI thoracic spine at this time concerning for progressive discitis/osteomyelitis at T7-T9, extensive phlegmonous change in the paravertebral soft tissues, partial collapse of T8 vertebral body with 40% vertebral body height loss and approximately 6 mm bony retropulsion, moderate canal stenosis and mild cord edema.  5/22 ID reconsulted 5/24 palliative care consulted for pain management assistance 5/28  Dr. Hoyt Koch thoracic decompression T5-T11 posterior posterolateral arthrodesis with left T8 transpedicular decompression laminectomies and segmental instrumentation with screws etc.   Assessment and Plan:  Disseminated MRSA bacteremia with  T7-T9 discitis and osteomyelitis PICC line in place -ID recs:      -daptomycin until 6/25; then transition to oral doxycycline 100 mg po bid indefinitely.       -Re-engage with ID 48 hr before d/c.      -OP follow up with Dr Daiva Eves 6/24 at 3:45pm -Tylenol 1000 3 times daily Celebrex 100 twice daily Duragesic 50 mcg every 48 Oxy IR 5-10 every 4 as needed-Cymbalta 20 daily-can continue Robaxin-750 every 6 as needed spasm, Lyrica 75 at bedtime -Continue bowel regimen -Placement awaiting skilled nursing facility -D/w Neurosurgery Dr. Ihor Austin on 6/20 and appreciate Ms. Tomlinson's input-wound care input appreciated 6/22 "daily cleanse followed by painting the affected area with a povidone-iodine swabstick. This will provide astringent properties as well as antimicrobial donation. After allowing this to air-dry, the areas is to be covered with dry gauze and secured with a silicone foam dressing "  Right foot osteomyelitis status post right fifth ray amputation 4/30 -Continue current antibiotic regimen. -WBAT -Will need outpatient follow-up with Dr. Felecia Jan clean on the right side--re-assess in the next several days   Urinary retention Start Flomax 0.4, finasteride 5 mg and try to liberate from Foley catheter on 6/24 AM otherwise will need outpatient urologist input clamping trial in 24-48 Foley change q monthly if needs to stay in--next change 6/30  Chronic systolic CHF Persistent A-fib CHADVASC >4 -Continue Lasix 20 daily, Coreg 3.125 twice daily, digoxin 125 daily---Home Sherryll Burger has been held from admission  -Previously was on Coreg 6.25 at home   Hypomagnesemia/hypokalemia Stable  -Replete as necessary--periodic labs   Acute metabolic encephalopathy 2/2 pain meds versus hyponatremia versus withdrawal. Mentation seems stable at this time  Moderate pleural effusion Stable on CT abdomen and pelvis on 09/14/2022. Respiratory status normal.  Repeat chest x-ray  done on 6/14  shows some infiltrates however he is currently stable on room air and we will watch him   Constipation Improved -Continue laxatives as needed.   Postop acute blood loss anemia Stable. Started posttransfusion 1 unit PRBCs  -Continue to monitor with CBC -Transfusion threshold hemoglobin < 7.   AKI ?2/2 to prerenal azotemia secondary to hypotension and dehydration--- has resolved but will need labs in the next several days   Hyponatremia Stable  -Different show includes secondary to volume overload versus poor oral intake. -Urine sodium less than 10, TSH within normal limits, serum osmolality 287, serum cortisol is 12. -Improving slowly but surely    Diabetes mellitus type 2 A1c 9.1 on 07/2022 CBGs 110-160 -Continue Semglee 20 units daily. -Continue SSI.   Left-sided abdominal pain with swelling CT abdomen pelvis with contrast on 09/04/2022 no acute abnormality noted small bilateral pleural effusions, bilateral lower lobe atelectasis or infiltrates, diffuse CAD, aortic atherosclerosis, left lower pole nonobstructing nephrolithiasis and anasarca throughout subcutaneous soft tissues.    Subjective:  Looks fair No distress Seems to have a lot on his mind re: wound and debility and SNF placement  Physical Exam:  Vitals:   09/25/22 0430 09/25/22 0500 09/25/22 0840 09/25/22 1240  BP: 123/74  134/83 (!) 153/92  Pulse: 62  77 66  Resp: 20  16 19   Temp: 98.1 F (36.7 C)  98.5 F (36.9 C) 98.6 F (37 C)  TempSrc: Oral  Oral Oral  SpO2: 98%  100% 98%  Weight:  87.1 kg    Height:       Eomi ncat no focal deficit S1 s2 no m/r/g RRR  Cta b no added sound no wheeze rales rhonchi Abd soft nt nd-indwelling Foley is in place LE's are some swollen--Stockings off   Data Reviewed:  I have personally reviewed and interpreted labs, imaging.   CBC: No results for input(s): "WBC", "NEUTROABS", "HGB", "HCT", "MCV", "PLT" in the last 168 hours.  Basic Metabolic Panel: Recent Labs   Lab 09/21/22 0335 09/25/22 0615  NA 132* 131*  K 4.3 4.0  CL 94* 98  CO2 26 25  GLUCOSE 145* 145*  BUN 12 13  CREATININE 1.12 1.24  CALCIUM 8.9 9.1    Code Status:  Full code.  Code status decision has been confirmed with: patient  Family Communication: none at bedside   Rhetta Mura MD  09/25/2022 5:32 PM

## 2022-09-26 ENCOUNTER — Ambulatory Visit: Payer: Self-pay | Admitting: Infectious Disease

## 2022-09-26 DIAGNOSIS — I96 Gangrene, not elsewhere classified: Secondary | ICD-10-CM

## 2022-09-26 DIAGNOSIS — M4644 Discitis, unspecified, thoracic region: Secondary | ICD-10-CM

## 2022-09-26 DIAGNOSIS — R7881 Bacteremia: Secondary | ICD-10-CM

## 2022-09-26 DIAGNOSIS — E162 Hypoglycemia, unspecified: Secondary | ICD-10-CM | POA: Diagnosis not present

## 2022-09-26 DIAGNOSIS — A429 Actinomycosis, unspecified: Secondary | ICD-10-CM

## 2022-09-26 DIAGNOSIS — T847XXD Infection and inflammatory reaction due to other internal orthopedic prosthetic devices, implants and grafts, subsequent encounter: Secondary | ICD-10-CM

## 2022-09-26 DIAGNOSIS — I739 Peripheral vascular disease, unspecified: Secondary | ICD-10-CM

## 2022-09-26 DIAGNOSIS — E11628 Type 2 diabetes mellitus with other skin complications: Secondary | ICD-10-CM

## 2022-09-26 LAB — CBC WITH DIFFERENTIAL/PLATELET
Abs Immature Granulocytes: 0.04 10*3/uL (ref 0.00–0.07)
Basophils Absolute: 0.1 10*3/uL (ref 0.0–0.1)
Basophils Relative: 1 %
Eosinophils Absolute: 0.7 10*3/uL — ABNORMAL HIGH (ref 0.0–0.5)
Eosinophils Relative: 9 %
HCT: 29.7 % — ABNORMAL LOW (ref 39.0–52.0)
Hemoglobin: 9.3 g/dL — ABNORMAL LOW (ref 13.0–17.0)
Immature Granulocytes: 1 %
Lymphocytes Relative: 13 %
Lymphs Abs: 0.9 10*3/uL (ref 0.7–4.0)
MCH: 29.6 pg (ref 26.0–34.0)
MCHC: 31.3 g/dL (ref 30.0–36.0)
MCV: 94.6 fL (ref 80.0–100.0)
Monocytes Absolute: 1 10*3/uL (ref 0.1–1.0)
Monocytes Relative: 13 %
Neutro Abs: 4.6 10*3/uL (ref 1.7–7.7)
Neutrophils Relative %: 63 %
Platelets: 186 10*3/uL (ref 150–400)
RBC: 3.14 MIL/uL — ABNORMAL LOW (ref 4.22–5.81)
RDW: 19.3 % — ABNORMAL HIGH (ref 11.5–15.5)
WBC: 7.2 10*3/uL (ref 4.0–10.5)
nRBC: 0 % (ref 0.0–0.2)

## 2022-09-26 LAB — GLUCOSE, CAPILLARY
Glucose-Capillary: 124 mg/dL — ABNORMAL HIGH (ref 70–99)
Glucose-Capillary: 152 mg/dL — ABNORMAL HIGH (ref 70–99)
Glucose-Capillary: 166 mg/dL — ABNORMAL HIGH (ref 70–99)
Glucose-Capillary: 178 mg/dL — ABNORMAL HIGH (ref 70–99)

## 2022-09-26 LAB — SEDIMENTATION RATE: Sed Rate: 36 mm/hr — ABNORMAL HIGH (ref 0–16)

## 2022-09-26 LAB — C-REACTIVE PROTEIN: CRP: 0.7 mg/dL (ref <1.0)

## 2022-09-26 MED ORDER — SACUBITRIL-VALSARTAN 49-51 MG PO TABS
1.0000 | ORAL_TABLET | Freq: Two times a day (BID) | ORAL | Status: DC
  Administered 2022-09-26 – 2022-10-28 (×64): 1 via ORAL
  Filled 2022-09-26 (×66): qty 1

## 2022-09-26 NOTE — TOC Progression Note (Signed)
Transition of Care [facility]) - Progression Note    Patient Details  Name: David [name] MRN: [id number] Date of Birth: [birth year]  Transition of [facility] CM/SW Contact  Baldemar Lenis, [place] Phone Number: 09/26/2022, 1:44 PM  Clinical Narrative:   CSW received contact from patient's wife over the weekend asking for an updated letter for eviction court. CSW compiled letter with an update and sent to the wife. CSW attempted to reach the wife via phone to confirm, but unable to reach wife.  Noting patient to complete IV antibiotics tomorrow, CSW can look for LOG placement after completion of IV antibiotics. CSW to follow.    Expected Discharge Plan: Skilled Nursing Facility Barriers to Discharge: Continued Medical Work up, Inadequate or no insurance  Expected Discharge Plan and Services   Discharge Planning Services: CM Consult Post Acute Care Choice: Home Health Living arrangements for the past 2 months: Hotel/Motel                                       Social Determinants of Health (SDOH) Interventions SDOH Screenings   Food Insecurity: Patient Unable To Answer (08/23/2022)  Housing: Patient Unable To Answer (08/23/2022)  Transportation Needs: Patient Unable To Answer (08/23/2022)  Utilities: Patient Unable To Answer (08/23/2022)  Depression (PHQ2-9): Low Risk  (11/12/2021)  Stress: Stress Concern Present (09/26/2017)  Tobacco Use: Medium Risk (09/25/2022)    Readmission Risk Interventions    09/06/2021   12:07 PM  Readmission Risk Prevention Plan  Post Dischage Appt Complete  Medication Screening Complete  Transportation Screening Complete

## 2022-09-26 NOTE — Progress Notes (Signed)
Occupational Therapy Treatment Patient Details Name: Haward Pope MRN: [id number] DOB: 12-Oct-1960 Today's Date: 09/26/2022   History of present illness Pt is a 62 y.o. male who presented 08/20/22  with hypoglycemia. 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 5/28 surgical repair: T5-11 posterior/posterolateral arthrodesis  & Transpedicular decompression with partial corpectomy, left T8 & T8-9 laminectomy, bilateral facetectomy. 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 Focused session on ambulation with Sara+ w/ 3 person assist. Manual assist provided to prevent B knee hyperextension as well as cues for posture/step sequencing w/ increasing control noted. Pt continues to manage TLSO brace and some LB dressing tasks bed level. Plan to trial B platform RW for mobility in next session as well as further address compensatory strategies for LB ADLs.   Recommendations for follow up therapy are one component of a multi-disciplinary discharge planning process, led by the attending physician.  Recommendations may be updated based on patient status, additional functional criteria and insurance authorization.    Assistance Recommended at Discharge Frequent or constant Supervision/Assistance  Patient can return home with the following  Two people to help with walking and/or transfers;Two people to help with bathing/dressing/bathroom   Equipment Recommendations  BSC/3in1;Wheelchair (measurements OT);Wheelchair cushion (measurements OT)    Recommendations for Other Services Rehab consult     Precautions / Restrictions Precautions Precautions: Fall;Back Spinal Brace: Thoracolumbosacral orthotic;Applied in sitting position Restrictions Weight Bearing Restrictions: No       Mobility Bed Mobility Overal bed mobility: Modified Independent Bed Mobility: Supine to Sit     Supine to sit: Modified independent (Device/Increase time)          Transfers Overall transfer level: Needs assistance Equipment used: Ambulation equipment used Transfers: Sit to/from Stand Sit to Stand: Min guard           General transfer comment: min guard to stand in Sara+     Balance Overall balance assessment: Needs assistance Sitting-balance support: Feet supported, No upper extremity supported Sitting balance-Leahy Scale: Good     Standing balance support: Bilateral upper extremity supported, During functional activity, Single extremity supported Standing balance-Leahy Scale: Poor                             ADL either performed or assessed with clinical judgement   ADL Overall ADL's : Needs assistance/impaired                 Upper Body Dressing : Set up;Sitting Upper Body Dressing Details (indicate cue type and reason): able to don TLSO brace with Setup without IV connected Lower Body Dressing: Set up;Bed level Lower Body Dressing Details (indicate cue type and reason): donning socks bringing feet to self bed level               General ADL Comments: focus on Sara+ again for gait w/ manual assist to correct B knee hyperextension and focus on step length/coordination. pt using reacher functionally to get paper towels from dispenser while seated in recliner    Extremity/Trunk Assessment Upper Extremity Assessment Upper Extremity Assessment:  Overall [facility] for tasks assessed   Lower Extremity Assessment Lower Extremity Assessment: Defer to PT evaluation        Vision   Vision Assessment?: No apparent visual deficits   Perception     Praxis       Cognition Arousal/Alertness: Awake/alert Behavior During Therapy: WFL for tasks assessed/performed Overall Cognitive Status: Within Functional Limits for tasks assessed                                 General Comments: Improved concentration and attention to controlling his steps when ambulating today        Exercises      Shoulder Instructions       General Comments      Pertinent Vitals/ Pain       Pain Assessment Pain Assessment: Faces Faces Pain Scale: Hurts little more Pain Location: back Pain Descriptors / Indicators: Discomfort, Grimacing, Guarding Pain Intervention(s): Monitored during session, Premedicated before session  Home Living                                          Prior Functioning/Environment              Frequency  Min 2X/week        Progress Toward Goals  OT Goals(current goals can now be found in the care plan section)  Progress towards OT goals: Progressing toward goals  Acute Rehab OT Goals Patient Stated Goal: walk out of here OT Goal Formulation: With patient Time For Goal Achievement: 09/26/22 Potential to Achieve Goals: Fair ADL Goals Pt Will Perform Grooming: Independently;standing Pt Will Perform Lower Body Bathing: with mod assist;sit to/from stand;with adaptive equipment Pt Will Perform Lower Body Dressing: with mod assist;sit to/from stand;with adaptive equipment Pt Will Transfer to Toilet: with mod assist;stand pivot transfer;bedside commode Pt Will Perform Toileting - Clothing Manipulation and hygiene: with modified independence;sit to/from stand Additional ADL Goal #1: Pt to increase standing tolerance > 3 min during functional tasks to decrease fall risk during LB ADLs  Plan Discharge plan remains appropriate    Co-evaluation    PT/OT/SLP Co-Evaluation/Treatment: Yes Reason for Co-Treatment: For patient/therapist safety;To address functional/ADL transfers   OT goals addressed  during session: ADL's and self-care;Proper use of Adaptive equipment and DME      AM-PAC OT "6 Clicks" Daily Activity     Outcome Measure   Help from another person eating meals?: None Help from another person taking care of personal grooming?: A Little Help from another person toileting, which includes using toliet, bedpan, or urinal?: Total Help from another person bathing (including washing, rinsing, drying)?: A Lot Help from another person to put on and taking off regular upper body clothing?: A Lot Help from another person to put on and taking off regular lower body clothing?: A Little 6 Click Score: 15    End of Session Equipment Utilized During Treatment: Gait belt;Back brace  OT Visit Diagnosis: Unsteadiness on feet (R26.81);Other abnormalities of gait and mobility (R26.89);Muscle weakness (generalized) (M62.81);Pain   Activity Tolerance Patient tolerated treatment well   Patient Left in chair;with call bell/phone within reach;with chair alarm set   Nurse Communication Mobility status        Time: 1610-9604 OT Time Calculation (min): 45 min  Charges: OT General Charges $OT Visit: 1 Visit OT Treatments $  Self Care/Home Management : 8-22 mins $Therapeutic Activity: 8-22 mins  Bradd Canary, OTR/L Acute Rehab Services Office: [phone]   Lorre Munroe 09/26/2022, 2:35 PM

## 2022-09-26 NOTE — Progress Notes (Signed)
[facility]  Date of Admission:  08/20/2022     Total days of antibiotics 55         ASSESSMENT:  David [name] is on track to complete Daptomycin portion of treatment on 6/25 with improvement in his inflammatory markers. Disposition currently remains unclear. Discussed plan of care to transition to Doxycycline 100 mg PO bid at the end of Daptomycin treatment for which he will remain on indefinitely. Depending upon disposition will arrange for follow up in the ID office. Continue current dose of Daptomycin through tomorrow. Remaining medical and supportive care per Internal Medicine.   PLAN:  Continue Daptomycin through 09/07/22. Transition to Doxycycline 100 mg PO bid after completion of treatment with Daptomycin Will arrange follow up in ID office with disposition pending.  Remaining medical and supportive care per Internal Medicine.  ID will sign off.  I have personally spent 24 minutes involved in face-to-face and non-face-to-face activities for this patient on the day of the visit. Professional time spent includes the following activities: Preparing to see the patient (review of tests), Obtaining and/or reviewing separately obtained history (admission/discharge record), Performing a medically appropriate examination and/or evaluation , Ordering medications/tests/procedures, referring and communicating with other health care professionals, Documenting clinical information in the EMR, Independently interpreting results (not separately reported), Communicating results to the patient/family/caregiver, Counseling and educating the patient/family/caregiver and Care coordination (not separately reported).    Principal Problem:   Hypoglycemia Active Problems:   Essential hypertension   Insulin dependent type 2 diabetes mellitus (HCC)   Persistent atrial fibrillation (HCC)   Pure hypercholesterolemia   Cellulitis in diabetic foot (HCC)   Osteomyelitis (HCC)    Diskitis   Hypokalemia   Hypotension   Constipation   Acute metabolic encephalopathy   Acute postoperative anemia due to expected blood loss    acetaminophen  1,000 mg Oral TID   apixaban  5 mg Oral BID   atorvastatin  80 mg Oral Daily   busPIRone  15 mg Oral BID   carvedilol  3.125 mg Oral BID   celecoxib  100 mg Oral BID   Chlorhexidine Gluconate Cloth  6 each Topical Daily   digoxin  125 mcg Oral Daily   [START ON 09/28/2022] doxycycline  100 mg Oral Q12H   DULoxetine  20 mg Oral Daily   famotidine  20 mg Oral BID   feeding supplement  237 mL Oral BID BM   fentaNYL  1 patch Transdermal Q48H   finasteride  5 mg Oral Daily   fluticasone  1 spray Each Nare Daily   furosemide  20 mg Oral Daily   insulin glargine-yfgn  20 Units Subcutaneous Daily   lidocaine  1 patch Transdermal Q24H   lidocaine  20 mL Intradermal Once   magnesium oxide  400 mg Oral BID   pantoprazole  40 mg Oral Daily   polyethylene glycol  17 g Oral BID   pregabalin  75 mg Oral QHS   senna-docusate  1 tablet Oral BID   sodium chloride flush  10-40 mL Intracatheter Q12H   sodium chloride flush  3 mL Intravenous Q12H   tamsulosin  0.4 mg Oral QPC supper    SUBJECTIVE:  David [name] was last seen on 09/18/22 by Dr. Renold Don with good tolerance to Daptomycin. CK levels have remained stable and improvement in inflammatory markers with CRP down to 0.7 and Sed rate 36. Continues to have back pain. Unclear of disposition.   No Known Allergies  Review of Systems: Review of Systems  Constitutional:  Negative for chills, fever and weight loss.  Respiratory:  Negative for cough, shortness of breath and wheezing.   Cardiovascular:  Negative for chest pain and leg swelling.  Gastrointestinal:  Negative for abdominal pain, constipation, diarrhea, nausea and vomiting.  Musculoskeletal:  Positive for back pain.  Skin:  Negative for rash.      OBJECTIVE: Vitals:   09/26/22 0347 09/26/22 0500 09/26/22 0822 09/26/22 1116   BP: 118/75  (!) 137/90 (!) 157/98  Pulse: 65  77 66  Resp: 18  18 18   Temp: 98.1 F (36.7 C)  98.5 F (36.9 C) 98.2 F (36.8 C)  TempSrc: Oral  Oral Oral  SpO2: 97%  98% 97%  Weight:  83.7 kg    Height:       Body mass index is 25.03 kg/m.  Physical Exam Constitutional:      General: He is not in acute distress.    Appearance: He is well-developed.  Cardiovascular:     Rate and Rhythm: Normal rate and regular rhythm.     Heart sounds: Normal heart sounds.  Pulmonary:     Effort: Pulmonary effort is normal.     Breath sounds: Normal breath sounds.  Skin:    General: Skin is warm and dry.     Comments: Right foot surgical incision is well approximated, clean and dry.   Neurological:     Mental Status: He is alert and oriented to person, place, and time.  Psychiatric:        Behavior: Behavior normal.        Thought Content: Thought content normal.        Judgment: Judgment normal.     Lab Results Lab Results  Component Value Date   WBC 7.2 09/26/2022   HGB 9.3 (L) 09/26/2022   HCT 29.7 (L) 09/26/2022   MCV 94.6 09/26/2022   PLT 186 09/26/2022    Lab Results  Component Value Date   CREATININE 1.24 09/25/2022   BUN 13 09/25/2022   NA 131 (L) 09/25/2022   K 4.0 09/25/2022   CL 98 09/25/2022   CO2 25 09/25/2022    Lab Results  Component Value Date   ALT 13 08/21/2022   AST 11 (L) 08/21/2022   ALKPHOS 82 08/21/2022   BILITOT 0.6 08/21/2022     Microbiology: No results found for this or any previous visit (from the past 240 hour(s)).   Marcos Eke, NP [facility]  09/26/2022  1:14 PM

## 2022-09-26 NOTE — Progress Notes (Signed)
PROGRESS NOTE   David [name]  UJW:[id number] DOB: [birth year] DOA: 08/20/2022 PCP: [name], MD   Date of Service: the patient was seen and examined on 09/26/2022  Brief Narrative:  62 y.o. WM [[facility] at [facility] 4 yrs] Prior disseminated MRSA bacteremia with T7-T9 discitis, right foot osteomyelitis s/p right fifth ray amputation on 4/30, PAD s/p angioplasty by Dr. Chestine Spore on 4/25  persistent A-fib on Eliquis CHad2vasc2>3 since 09/2021  DM-2, seizure disorder, HTN, HLD and chronic pain r Return to ED with hypoglycemia and excess opiate intake.  Hospitalized for disseminated MRSA bacteremia from 4/9-5/17--hospitalization interspersed with AKI-angio plasty tibioperoneal trunk + peroneal Dr. Chestine Spore 4/25 4/30 underwent R fifth ray revision amputation-MRI 08/08/2022 showed persistent osteomyelitis T7-T9 with involvement of T7 vertebrae compared to prior patient was changed to oral antibiotics after completing 5 weeks of vancyomycin,then daptomycin. discharged on p.o. doxycycline-- Patient developed at that time bilateral pneumonia with a chest tube placed by IR on 4/24 and removed on 4/26  Re-Admitted 5/18 for profound hypoglycemia  5/20 CT chest showed discitis at T7-T8-T9 with progressive bony destruction + collapsed T8 and retropulsion of 8 mm 5/20 MRI thoracic spine at this time concerning for progressive discitis/osteomyelitis at T7-T9, extensive phlegmonous change in the paravertebral soft tissues, partial collapse of T8 vertebral body with 40% vertebral body height loss and approximately 6 mm bony retropulsion, moderate canal stenosis and mild cord edema.  5/22 ID reconsulted 5/24 palliative care consulted for pain management assistance 5/28  Dr. Hoyt Koch thoracic decompression T5-T11 posterior posterolateral arthrodesis with left T8 transpedicular decompression laminectomies and segmental instrumentation with screws etc.   Assessment and Plan:  Disseminated MRSA bacteremia with  T7-T9 discitis and osteomyelitis PICC line in place -ID recs:      -daptomycin until 6/25; then transition to oral doxycycline 100 mg po bid indefinitely.       -Re-engage with ID 48 hr before d/c.      -OP follow up with Dr Daiva Eves 6/24 at 3:45pm -Tylenol 1000 3 times daily Celebrex 100 twice daily Duragesic 50 mcg every 48 Oxy IR 5-10 every 4 as needed-Cymbalta 20 daily-can continue Robaxin-750 every 6 as needed spasm, Lyrica 75 at bedtime -Continue bowel regimen -Placement awaiting skilled nursing facility -appreciate Ms. Tomlinson's input 09/24/22--wound care input appreciated 6/22 "daily cleanse followed by painting the affected area with a povidone-iodine swabstick. This will provide astringent properties as well as antimicrobial donation. After allowing this to air-dry, the areas is to be covered with dry gauze and secured with a silicone foam dressing "  Right foot osteomyelitis status post right fifth ray amputation 4/30 -Continue current antibiotic regimen. -WBAT and mobilize with PT -Will need outpatient follow-up with Dr. Felecia Jan clean on the right side--wound looked at on 6/24 and looks stable on LLE foot  Urinary retention Start Flomax 0.4, finasteride 5 mg  Failed voiding trial--replace foley  Chronic systolic CHF Persistent A-fib CHADVASC >4 -Continue Lasix 20 daily, Coreg 3.125 twice daily, digoxin 125 daily--resume Entresto 6/24 -Previously was on Coreg 6.25 at home   Hypomagnesemia/hypokalemia Stable  -Replete as necessary--periodic labs   Acute metabolic encephalopathy 2/2 pain meds versus hyponatremia versus withdrawal. Mentation seems stable at this time  Moderate pleural effusion Stable on CT abdomen and pelvis on 09/14/2022. Respiratory status normal.  Repeat chest x-ray done on 6/14 shows some infiltrates --no concern for pna or vol overload   Constipation Improved -Continue laxatives as needed.   Postop acute blood loss anemia Stable. Started  posttransfusion 1 unit PRBCs  -Continue to monitor with CBC -Transfusion threshold hemoglobin < 7   AKI ?2/2 to prerenal azotemia secondary to hypotension and dehydration---resolved--periodic labs   Hyponatremia Stable  -Different show includes secondary to volume overload versus poor oral intake. -Urine sodium less than 10, TSH within normal limits, serum osmolality 287, serum cortisol is 12. -Improving slowly but surely    Diabetes mellitus type 2 A1c 9.1 on 07/2022 CBGs 110-160 -Continue Semglee 20 units daily. -Continue SSI.   Left-sided abdominal pain with swelling CT abdomen pelvis with contrast on 09/04/2022 no acute abnormality noted small bilateral pleural effusions, bilateral lower lobe atelectasis or infiltrates, diffuse CAD, aortic atherosclerosis, left lower pole nonobstructing nephrolithiasis and anasarca throughout subcutaneous soft tissues.    Subjective:  Looks fair Foley couldn't be removed no other real issue  Physical Exam:  Vitals:   09/26/22 0500 09/26/22 0822 09/26/22 1116 09/26/22 1619  BP:  (!) 137/90 (!) 157/98 (!) 143/85  Pulse:  77 66 84  Resp:  18 18 18   Temp:  98.5 F (36.9 C) 98.2 F (36.8 C) 98.3 F (36.8 C)  TempSrc:  Oral Oral Oral  SpO2:  98% 97% 98%  Weight: 83.7 kg     Height:       Eomi ncat no focal deficit S1 s2 no m/r/g RRR  Cta b no added sound no wheeze rales rhonchi Abd soft nt nd   Data Reviewed:  I have personally reviewed and interpreted labs, imaging.   CBC: Recent Labs  Lab 09/26/22 0507  WBC 7.2  NEUTROABS 4.6  HGB 9.3*  HCT 29.7*  MCV 94.6  PLT 186    Basic Metabolic Panel: Recent Labs  Lab 09/21/22 0335 09/25/22 0615  NA 132* 131*  K 4.3 4.0  CL 94* 98  CO2 26 25  GLUCOSE 145* 145*  BUN 12 13  CREATININE 1.12 1.24  CALCIUM 8.9 9.1    Code Status:  Full code.  Code status decision has been confirmed with: patient  Family Communication: none at bedside   Rhetta Mura  MD  09/26/2022 6:17 PM

## 2022-09-26 NOTE — Progress Notes (Signed)
Physical Therapy Treatment Patient Details Name: David [name] MRN: [id number] DOB: 17-Jan-1961 Today's Date: 09/26/2022   History of Present Illness Pt is a 62 y.o. male who presented 08/20/22  with hypoglycemia. 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 5/28 surgical repair: T5-11 posterior/posterolateral arthrodesis  & Transpedicular decompression with partial corpectomy, left T8 & T8-9 laminectomy, bilateral facetectomy. 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    PT Comments    Patient highly motivated to work with therapy and resting supine in bed with OT at bedside at start of session. Pt completed supine>sit EOB with use of bed features at Mod I level. Min assist for donning TLSO and pt set up with Huntley Dec Plus harness for gait training. Min guard for power up to [employer] with +2 for safety. Pt amb ~4 bouts of varying distances (17', 37', 18', 52'). +3 assist present for final 3 bouts and PT/OT provided manual facilitation at bil knees to prevent hyperextension in stance phase. Pt's quad/hamstring control improved with shortened step length and assist provided for slight abduction on Lt LE to prevent narrow BOS. Discussed progressed of AD with pt with goals to work toward [employer] walker. Continue to recommend intense rehab follow up. Will progress as able.   Recommendations for follow up therapy are one component of a multi-disciplinary discharge planning process, led by the attending physician.  Recommendations may be updated based on patient status, additional functional criteria and insurance authorization.  Follow Up Recommendations  Can patient physically  be transported by private vehicle: No    Assistance Recommended at Discharge Frequent or constant Supervision/Assistance  Patient can return home with the following Assist for transportation;Two people to help with walking and/or transfers;A lot of help with bathing/dressing/bathroom;Assistance with cooking/housework;Direct supervision/assist for financial management;Direct supervision/assist for medications management;Help with stairs or ramp for entrance   Equipment Recommendations  Rolling walker (2 wheels);Wheelchair cushion (measurements PT);Wheelchair (measurements PT);BSC/3in1    Recommendations for Other Services Rehab consult     Precautions / Restrictions Precautions Precautions: Fall;Back Required Braces or Orthoses: Spinal Brace Spinal Brace: Thoracolumbosacral orthotic;Applied in sitting position Other Brace: for use when ambulating. Restrictions Weight Bearing Restrictions: No RLE Weight Bearing: Weight bearing as tolerated     Mobility  Bed Mobility Overal bed mobility: Modified Independent Bed Mobility: Supine to Sit     Supine to sit: Modified independent (Device/Increase time)     General bed mobility comments: use of bed features with HOB elevated. pt maintaining neutral spine with supine>sit.    Transfers Overall transfer level: Needs assistance Equipment used: Ambulation equipment used Transfers: Sit to/from Stand Sit to Stand: Min guard           General transfer comment: pt able to initiate power up from EOB with Huntley Dec plus and min guard. Pt also completed partial stands with paltform of sara plus in low position to place pillow and remove waist harness.    Ambulation/Gait Ambulation/Gait assistance: +2 physical assistance, +2 safety/equipment, Mod assist Gait Distance (Feet): 124 Feet (17', 37', 18', 52') Assistive device:  (Sara Plus) Gait Pattern/deviations: Knee hyperextension - right, Knee hyperextension - left, Decreased dorsiflexion -  right, Decreased dorsiflexion - left, Ataxic,  Drifts right/left, Narrow base of support Gait velocity: decr     General Gait Details: gait completed with Huntley Dec Plus and +2 assist with additional chair follow for safety. Pt required min-mod assist at bil LE's Lt>Rt to prevent knee hyperextenion in stance phase. Pt demonstrated improved quad/hamstring control in stance with shortened step length. BOS narrow and pt stepping eet on contralateral toes at times requiring assist to abduct LE's slight.   Stairs             Wheelchair Mobility    Modified Rankin (Stroke Patients Only)       Balance Overall balance assessment: Needs assistance Sitting-balance support: Feet supported, No upper extremity supported Sitting balance-Leahy Scale: Good     Standing balance support: Bilateral upper extremity supported, During functional activity, Single extremity supported Standing balance-Leahy Scale: Poor                              Cognition Arousal/Alertness: Awake/alert Behavior During Therapy: WFL for tasks assessed/performed Overall Cognitive Status: Within Functional Limits for tasks assessed                                 General Comments: Improved concentration and attention to controlling his steps when ambulating today. pt enjoys joking but able to focus when directed        Exercises      General Comments        Pertinent Vitals/Pain Pain Assessment Pain Assessment: Faces Faces Pain Scale: Hurts little more Pain Location: back Pain Descriptors / Indicators: Discomfort, Grimacing, Guarding Pain Intervention(s): Monitored during session, Limited activity within patient's tolerance, Repositioned    Home Living                          Prior Function            PT Goals (current goals can now be found in the care plan section) Acute Rehab PT Goals Patient Stated Goal: to walk again PT Goal Formulation: With patient Time  For Goal Achievement: 09/28/22 Potential to Achieve Goals: Good Progress towards PT goals: Progressing toward goals    Frequency    Min 5X/week      PT Plan Current plan remains appropriate    Co-evaluation PT/OT/SLP Co-Evaluation/Treatment: Yes Reason for Co-Treatment: For patient/therapist safety;To address functional/ADL transfers PT goals addressed during session: Mobility/safety with mobility;Proper use of DME OT goals addressed during session: ADL's and self-care;Proper use of Adaptive equipment and DME      AM-PAC PT "6 Clicks" Mobility   Outcome Measure  Help needed turning from your back to your side while in a flat bed without using bedrails?: A Little Help needed moving from lying on your back to sitting on the side of a flat bed without using bedrails?: A Little Help needed moving to and from a bed to a chair (including a wheelchair)?: Total Help needed standing up from a chair using your arms (e.g., wheelchair or bedside chair)?: A Lot Help needed to walk in hospital room?: Total Help needed climbing 3-5 steps with a railing? : Total 6 Click Score: 11    End of Session Equipment Utilized During Treatment: Back brace;Gait belt Activity Tolerance: Patient tolerated treatment well Patient left: in chair;with call bell/phone within reach;with chair alarm set Nurse Communication: Mobility status PT Visit Diagnosis: Difficulty in  walking, not elsewhere classified (R26.2);Other abnormalities of gait and mobility (R26.89);Muscle weakness (generalized) (M62.81);Pain;Unsteadiness on feet (R26.81) Pain - Right/Left: Left Pain - part of body: Leg     Time: 4132-4401 PT Time Calculation (min) (ACUTE ONLY): 39 min  Charges:  $Gait Training: 8-22 mins                     Wynn Maudlin, DPT Acute Rehabilitation Services Office [phone]  09/26/22 3:38 PM

## 2022-09-27 DIAGNOSIS — E162 Hypoglycemia, unspecified: Secondary | ICD-10-CM | POA: Diagnosis not present

## 2022-09-27 LAB — GLUCOSE, CAPILLARY
Glucose-Capillary: 145 mg/dL — ABNORMAL HIGH (ref 70–99)
Glucose-Capillary: 115 mg/dL — ABNORMAL HIGH (ref 70–99)
Glucose-Capillary: 187 mg/dL — ABNORMAL HIGH (ref 70–99)

## 2022-09-27 NOTE — Progress Notes (Signed)
Physical Therapy Treatment Patient Details Name: David [name] MRN: [id number] DOB: 1960-07-10 Today's Date: 09/27/2022   History of Present Illness Pt is a 62 y.o. male who presented 08/20/22  with hypoglycemia. 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 5/28 surgical repair: T5-11 posterior/posterolateral arthrodesis  & Transpedicular decompression with partial corpectomy, left T8 & T8-9 laminectomy, bilateral facetectomy. 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 Pt able to roll in bed for linen repositioning with use of bed rail and came to sit EOB with at Mod I level. Pt donned TLSO at Mod I level sitting EOB, cues to snug brace with pull cord. Pt completed sit<>stand from EOB with Min guard, sara plus elevated to initiate stand and pt cues to power up without full support of lift, pt able to rise to 75% of full stand. Amb short bout from bed to sink and lowered to recliner for seated rest to brush teeth while sitting. Pt provided set-up assist only for oral hygiene. Transported pt in chair with sara plus to hallway for straight-away ambulation. Pt amb ~125' in single bout! Pt required min-mod assist to abduct Lt LE with steps to prevent narrow BOS and blocking at posterior knee on Lt>Rt to prevent hyperextension. Pt's LE's buckling at end of gait distance and seated rest provided in recliner. Pt provided extended rest and additional 35' bout of gait completed. Pt transported back to room in recliner due to muscle fatigue. EOS pt agreeable to remain OOB, Alarm on and call bell within reach and all needs  met.     Recommendations for follow up therapy are one component of a multi-disciplinary discharge planning process, led by the attending physician.  Recommendations may be updated based on patient status, additional functional criteria and insurance authorization.  Follow Up Recommendations  Can patient physically be transported by private vehicle: No    Assistance Recommended at Discharge Frequent or constant Supervision/Assistance  Patient can return home with the following Assist for transportation;Two people to help with walking and/or transfers;A lot of help with bathing/dressing/bathroom;Assistance with cooking/housework;Direct supervision/assist for financial management;Direct supervision/assist for medications management;Help with stairs or ramp for entrance   Equipment Recommendations  Rolling walker (2 wheels);Wheelchair cushion (measurements PT);Wheelchair (measurements PT);BSC/3in1    Recommendations for Other Services Rehab consult     Precautions / Restrictions Precautions Precautions: Fall;Back Required Braces or Orthoses: Spinal Brace Spinal Brace: Thoracolumbosacral orthotic;Applied in sitting position Other Brace: for use when ambulating. Restrictions Weight Bearing Restrictions: No RLE Weight Bearing: Weight bearing as tolerated     Mobility  Bed Mobility Overal bed mobility: Modified Independent Bed Mobility: Supine to Sit     Supine to sit: Modified independent (Device/Increase time)     General bed mobility comments: use of bed features with HOB elevated. pt maintaining neutral spine with supine>sit.    Transfers Overall transfer level: Needs assistance Equipment used: Ambulation equipment used Transfers: Sit to/from Stand Sit to Stand: Min guard           General transfer comment: pt able to initiate power up from EOB and recliner with Huntley Dec plus  and min guard.    Ambulation/Gait Ambulation/Gait assistance: +2 physical assistance, +2  safety/equipment, Mod assist Gait Distance (Feet): 125 Feet (125, 35) Assistive device:  (Sara Plus) Gait Pattern/deviations: Knee hyperextension - right, Knee hyperextension - left, Decreased dorsiflexion - right, Decreased dorsiflexion - left, Ataxic, Drifts right/left, Narrow base of support Gait velocity: decr     General Gait Details: gait completed with Huntley Dec Plus and +2 assist with additional chair follow for safety. Pt required min-mod assist at bil LE's Lt>Rt to prevent knee hyperextenion in stance phase. Pt demonstrated improved quad/hamstring control in stance with shortened step length. BOS narrow and pt stepping Lt foot on Rt toes at times requiring assist to abduct Lt LE for improved BOS.   Stairs             Wheelchair Mobility    Modified Rankin (Stroke Patients Only)       Balance Overall balance assessment: Needs assistance Sitting-balance support: Feet supported, No upper extremity supported Sitting balance-Leahy Scale: Good     Standing balance support: Bilateral upper extremity supported, During functional activity, Single extremity supported Standing balance-Leahy Scale: Poor                              Cognition Arousal/Alertness: Awake/alert Behavior During Therapy: WFL for tasks assessed/performed Overall Cognitive Status: Within Functional Limits for tasks assessed                                 General Comments: Improved concentration and attention to controlling his steps when ambulating today. pt enjoys joking but able to focus when directed.        Exercises      General Comments        Pertinent Vitals/Pain Pain Assessment Pain Assessment: Faces Faces Pain Scale: Hurts little more Pain Location: back Pain Descriptors / Indicators: Discomfort, Grimacing, Guarding Pain Intervention(s): Limited activity within patient's tolerance, Monitored during session, Repositioned    Home Living                           Prior Function            PT Goals (current goals can now be found in the care plan section) Acute Rehab PT Goals Patient Stated Goal: to walk again PT Goal Formulation: With patient Time For Goal Achievement: 09/28/22 Potential to Achieve Goals: Good Progress towards PT goals: Progressing toward goals    Frequency    Min 5X/week      PT Plan Current plan remains appropriate    Co-evaluation              AM-PAC PT "6 Clicks" Mobility   Outcome Measure  Help needed turning from your back to your side while in a flat bed without using bedrails?: A Little Help needed moving from lying on your back to sitting on the side of a flat bed without using bedrails?: A Little Help needed moving to and from a bed to a chair (including a wheelchair)?: Total Help needed standing up from a chair using your arms (e.g., wheelchair or bedside chair)?: A Lot Help needed to walk in hospital room?: Total Help needed climbing 3-5 steps with a railing? : Total 6 Click Score: 11    End of Session Equipment Utilized During Treatment: Back brace;Gait belt Activity Tolerance: Patient  tolerated treatment well Patient left: in chair;with call bell/phone within reach;with chair alarm set Nurse Communication: Mobility status PT Visit Diagnosis: Difficulty in walking, not elsewhere classified (R26.2);Other abnormalities of gait and mobility (R26.89);Muscle weakness (generalized) (M62.81);Pain;Unsteadiness on feet (R26.81) Pain - Right/Left: Left Pain - part of body: Leg     Time: 6160-7371 PT Time Calculation (min) (ACUTE ONLY): 41 min  Charges:  $Gait Training: 23-37 mins $Therapeutic Activity: 8-22 mins                     Wynn Maudlin, DPT Acute Rehabilitation Services Office [phone]  09/27/22 3:50 PM

## 2022-09-27 NOTE — Progress Notes (Signed)
PROGRESS NOTE   David [name]  [name] DOB: November 16, 1960 DOA: 08/20/2022 PCP: [name], MD   Date of Service: the patient was seen and examined on 09/27/2022  Brief Narrative:  62 y.o. WM [[facility] at [facility] 4 yrs] Prior disseminated MRSA bacteremia with T7-T9 discitis, right foot osteomyelitis s/p right fifth ray amputation on 4/30, PAD s/p angioplasty by Dr. Chestine Spore on 4/25  persistent A-fib on Eliquis CHad2vasc2>3 since 09/2021  DM-2, seizure disorder, HTN, HLD and chronic pain r Return to ED with hypoglycemia and excess opiate intake.  Hospitalized for disseminated MRSA bacteremia from 4/9-5/17--hospitalization interspersed with AKI-angio plasty tibioperoneal trunk + peroneal Dr. Chestine Spore 4/25 4/30 underwent R fifth ray revision amputation-MRI 08/08/2022 showed persistent osteomyelitis T7-T9 with involvement of T7 vertebrae compared to prior patient was changed to oral antibiotics after completing 5 weeks of vancyomycin,then daptomycin. discharged on p.o. doxycycline-- Patient developed at that time bilateral pneumonia with a chest tube placed by IR on 4/24 and removed on 4/26  Re-Admitted 5/18 for profound hypoglycemia  5/20 CT chest showed discitis at T7-T8-T9 with progressive bony destruction + collapsed T8 and retropulsion of 8 mm 5/20 MRI thoracic spine at this time concerning for progressive discitis/osteomyelitis at T7-T9, extensive phlegmonous change in the paravertebral soft tissues, partial collapse of T8 vertebral body with 40% vertebral body height loss and approximately 6 mm bony retropulsion, moderate canal stenosis and mild cord edema.  5/22 ID reconsulted 5/24 palliative care consulted for pain management assistance 5/28  Dr. Hoyt Koch thoracic decompression T5-T11 posterior posterolateral arthrodesis with left T8 transpedicular decompression laminectomies and segmental instrumentation with screws etc.   Assessment and Plan:  Disseminated MRSA bacteremia with  T7-T9 discitis and osteomyelitis PICC line in place -ID recs:      -daptomycin until 6/25; then transition to oral doxycycline 100 mg po bid indefinitely.       -Re-engage with ID 48 hr before d/c.      -OP follow up with Dr David [name] 6/24 at 3:45pm -Tylenol 1000 3 times daily Celebrex 100 twice daily Duragesic 50 mcg every 48 Oxy IR 5-10 every 4 as needed-Cymbalta 20 daily-can continue Robaxin-750 every 6 as needed spasm, Lyrica 75 at bedtime -Continue bowel regimen -Placement awaiting skilled nursing facility--ambulated with great difficulty and max assist with PT in 6/25--NEEDS REHAB -appreciate Ms. [name]'s [neurosurgery] input 09/24/22--wound care input appreciated 6/22 "daily cleanse followed by painting the affected area with a povidone-iodine swabstick. This will provide astringent properties as well as antimicrobial donation. After allowing this to air-dry, the areas is to be covered with dry gauze and secured with a silicone foam dressing "  Right foot osteomyelitis status post right fifth ray amputation 4/30 -Continue current antibiotic regimen. -Will need outpatient follow-up with Dr. Felecia [name] clean on the right side--wound looked at on 6/24 and looks stable on LLE foot  Urinary retention Start Flomax 0.4, finasteride 5 mg  Failed voiding trial--replace foley---needs further OP attempt on 10/17/22 [3 weeks] or then, referral to urology  Chronic systolic CHF Persistent A-fib CHADVASC >4 -Continue Lasix 20 daily, Coreg 3.125 twice daily, digoxin 125 daily--resumed Entresto 6/24 -Previously was on Coreg 6.25 at home   Hypomagnesemia/hypokalemia Stable  -Replete as necessary--periodic labs   Acute metabolic encephalopathy 2/2 pain meds versus hyponatremia versus withdrawal. Mentation seems stable at this time  Moderate pleural effusion Stable on CT abdomen and pelvis on 09/14/2022. Respiratory status normal.  Repeat chest x-ray done on 6/14 shows some infiltrates --no  concern for pna  or vol overload   Constipation Improved -Continue laxatives as needed.   Postop acute blood loss anemia Stable. Started posttransfusion 1 unit PRBCs  -Continue to monitor with CBC -Transfusion threshold hemoglobin < 7   AKI ?2/2 to prerenal azotemia secondary to hypotension and dehydration---resolved--periodic labs   Hyponatremia Stable  -Different show includes secondary to volume overload versus poor oral intake. -Urine sodium less than 10, TSH within normal limits, serum osmolality 287, serum cortisol is 12.   Diabetes mellitus type 2 A1c 9.1 on 07/2022 CBGs 110-160 -Continue Semglee 20 units daily. -Continue SSI.   Left-sided abdominal pain with swelling CT abdomen pelvis with contrast on 09/04/2022 no acute abnormality noted small bilateral pleural effusions, bilateral lower lobe atelectasis or infiltrates, diffuse CAD, aortic atherosclerosis, left lower pole nonobstructing nephrolithiasis and anasarca throughout subcutaneous soft tissues.    Subjective:  Seen while working with PT wth equipment etc--no new issue   Physical Exam:  Vitals:   09/27/22 0011 09/27/22 0440 09/27/22 0930 09/27/22 1333  BP: (!) 151/91 (!) 140/84 (!) 142/88 (!) 158/83  Pulse: 67 74 75 75  Resp: 18 16 17 17   Temp: 97.7 F (36.5 C) 98.3 F (36.8 C) 98 F (36.7 C) 98.5 F (36.9 C)  TempSrc: Oral Oral Oral Oral  SpO2: 100% 95% 99% 98%  Weight:      Height:       Eomi ncat no focal deficit S1 s2 no m/r/g RRR  Cta b nno wheeze rales rhonchi Abd soft nt nd LE's not examined today.   Neuro intact no overt focal deficit Wound on back not examined today  Data Reviewed:  I have personally reviewed and interpreted labs, imaging.   CBC: Recent Labs  Lab 09/26/22 0507  WBC 7.2  NEUTROABS 4.6  HGB 9.3*  HCT 29.7*  MCV 94.6  PLT 186    Basic Metabolic Panel: Recent Labs  Lab 09/21/22 0335 09/25/22 0615  NA 132* 131*  K 4.3 4.0  CL 94* 98  CO2 26 25  GLUCOSE  145* 145*  BUN 12 13  CREATININE 1.12 1.24  CALCIUM 8.9 9.1    Code Status:  Full code.  Code status decision has been confirmed with: patient  Family Communication: none at bedside   Rhetta Mura MD  09/27/2022 4:19 PM

## 2022-09-28 DIAGNOSIS — E162 Hypoglycemia, unspecified: Secondary | ICD-10-CM | POA: Diagnosis not present

## 2022-09-28 LAB — GLUCOSE, CAPILLARY
Glucose-Capillary: 160 mg/dL — ABNORMAL HIGH (ref 70–99)
Glucose-Capillary: 152 mg/dL — ABNORMAL HIGH (ref 70–99)
Glucose-Capillary: 154 mg/dL — ABNORMAL HIGH (ref 70–99)

## 2022-09-28 NOTE — Progress Notes (Signed)
Occupational Therapy Treatment Patient Details Name: David [name] MRN: [id number] DOB: 25-Aug-1960 Today's Date: 09/28/2022   History of present illness Pt is a 62 y.o. male who presented 08/20/22  with hypoglycemia. 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 5/28 surgical repair: T5-11 posterior/posterolateral arthrodesis  & Transpedicular decompression with partial corpectomy, left T8 & T8-9 laminectomy, bilateral facetectomy. 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 Patient able to donn socks at bed level and donn back brace seated on EOB. Patient ambulated to sink for grooming with BUE platform walker but stated they were in his way and were removed. Patient completed grooming standing at sink with RW and performed mobility with RW. Patient states it took more effort with RW but would like to continue with using to increase independence. Acute OT to continue to follow.    Recommendations for follow up therapy are one component of a multi-disciplinary discharge planning process, led by the attending physician.  Recommendations may be updated based on patient status, additional functional criteria and insurance authorization.    Assistance Recommended at Discharge Frequent or constant Supervision/Assistance  Patient can return home with the following  Two people to help with walking and/or transfers;Two people to help with bathing/dressing/bathroom   Equipment Recommendations  BSC/3in1;Wheelchair  (measurements OT);Wheelchair cushion (measurements OT)    Recommendations for Other Services Rehab consult    Precautions / Restrictions Precautions Precautions: Fall;Back Precaution Booklet Issued: No Required Braces or Orthoses: Spinal Brace Spinal Brace: Thoracolumbosacral orthotic;Applied in sitting position Other Brace: for use when ambulating. Restrictions Weight Bearing Restrictions: No RLE Weight Bearing: Weight bearing as tolerated       Mobility Bed Mobility Overal bed mobility: Modified Independent Bed Mobility: Supine to Sit     Supine to sit: Modified independent (Device/Increase time)     General bed mobility comments: no assistance needed to get to EOB    Transfers Overall transfer level: Needs assistance Equipment used: Right platform walker, Left platform walker, Rolling walker (2 wheels) Transfers: Sit to/from Stand, Bed to chair/wheelchair/BSC Sit to Stand: Min guard           General transfer comment: ambulated to sink with BUE platform walker and stood at sink with platforms for grooming but complained of them getting in his way and they were removed. Patient able to perform mobility and transfers with use RW.     Balance Overall balance assessment: Needs assistance Sitting-balance support: Feet supported, No upper extremity supported Sitting balance-Leahy Scale: Good Sitting balance - Comments: donned TLSO while seated on EOB   Standing balance support: Bilateral upper extremity supported, During functional activity, Single extremity supported Standing balance-Leahy Scale: Poor Standing balance comment: able to stand at sink with single extremity support to perform grooming task, able to demonstrate `3 seconds of standing without UE support while at sink                           ADL either performed or assessed with clinical judgement   ADL  Overall ADL's : Needs assistance/impaired     Grooming: Wash/dry hands;Wash/dry  face;Oral care;Minimal assistance;Standing Grooming Details (indicate cue type and reason): stood at sink to shave head with electric trimmer and oral care, able to stand unsupport for brief period             Lower Body Dressing: Set up;Bed level Lower Body Dressing Details (indicate cue type and reason): donning socks bringing feet to self bed level               General ADL Comments: used BUE platform walker to ambulate to sink but complained platforms were in the way and was able to complete grooming standing at sink with platforms    Extremity/Trunk Assessment              Vision       Perception     Praxis      Cognition Arousal/Alertness: Awake/alert Behavior During Therapy: WFL for tasks assessed/performed Overall Cognitive Status: Within Functional Limits for tasks assessed                         Following Commands: Follows multi-step commands with increased time       General Comments: joking often during visit. Continues to requires cues for safety with transfers        Exercises      Shoulder Instructions       General Comments      Pertinent Vitals/ Pain       Pain Assessment Pain Assessment: Faces Faces Pain Scale: Hurts little more Pain Location: back Pain Descriptors / Indicators: Discomfort, Grimacing, Guarding Pain Intervention(s): Limited activity within patient's tolerance, Monitored during session, Premedicated before session, Repositioned  Home Living                                          Prior Functioning/Environment              Frequency  Min 2X/week        Progress Toward Goals  OT Goals(current goals can now be found in the care plan section)  Progress towards OT goals: Progressing toward goals  Acute Rehab OT Goals Patient Stated Goal: get stronger OT Goal Formulation: With patient Time For Goal Achievement: 09/26/22 Potential to Achieve Goals: Fair ADL Goals Pt Will  Perform Grooming: Independently;standing Pt Will Perform Lower Body Bathing: with mod assist;sit to/from stand;with adaptive equipment Pt Will Perform Lower Body Dressing: with mod assist;sit to/from stand;with adaptive equipment Pt Will Transfer to Toilet: with mod assist;stand pivot transfer;bedside commode Pt Will Perform Toileting - Clothing Manipulation and hygiene: with modified independence;sit to/from stand Additional ADL Goal #1: Pt to increase standing tolerance > 3 min during functional tasks to decrease fall risk during LB ADLs  Plan Discharge plan remains appropriate    Co-evaluation    PT/OT/SLP Co-Evaluation/Treatment: Yes Reason for Co-Treatment: For patient/therapist safety;To address functional/ADL transfers   OT goals addressed during session: ADL's and self-care;Proper use of Adaptive equipment and DME      AM-PAC OT "6 Clicks" Daily Activity     Outcome Measure   Help from another person eating meals?: None Help from another person taking care of personal grooming?: A Little Help from another person toileting, which includes using toliet, bedpan, or urinal?: Total Help from another person bathing (including washing, rinsing, drying)?: A  Lot Help from another person to put on and taking off regular upper body clothing?: A Lot Help from another person to put on and taking off regular lower body clothing?: A Little 6 Click Score: 15    End of Session Equipment Utilized During Treatment: Gait belt;Rolling walker (2 wheels);Back brace  OT Visit Diagnosis: Unsteadiness on feet (R26.81);Other abnormalities of gait and mobility (R26.89);Muscle weakness (generalized) (M62.81);Pain   Activity Tolerance Patient tolerated treatment well   Patient Left in chair;with call bell/phone within reach;with chair alarm set   Nurse Communication Mobility status        Time: 5284-1324 OT Time Calculation (min): 53 min  Charges: OT General Charges $OT Visit: 1 Visit OT  Treatments $Self Care/Home Management : 23-37 mins  [name], OTA Acute Rehabilitation Services  Office [phone]   Dewain Penning 09/28/2022, 2:54 PM

## 2022-09-28 NOTE — Progress Notes (Signed)
Physical Therapy Treatment Patient Details Name: David [name] MRN: [id number] DOB: 02/01/1961 Today's Date: 09/28/2022   History of Present Illness Pt is a 62 y.o. male who presented 08/20/22  with hypoglycemia. 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 5/28 surgical repair: T5-11 posterior/posterolateral arthrodesis  & Transpedicular decompression with partial corpectomy, left T8 & T8-9 laminectomy, bilateral facetectomy. 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 Attempted utilizing a bil platform RW to provide improved support and reduce energy utilized for balance to progress gait distance, but pt did not like the bil platforms and he did well with the RW without the platforms today. He displays improved bil knee stability as he did not have any knee buckling today. He also displays improved bil lower extremity coordination with improved control when taking steps, needing only intermittent cues to widen his BOS. Will continue to follow acutely.    Recommendations for follow up therapy are one component of a multi-disciplinary discharge planning process, led by the attending physician.  Recommendations may be updated based on patient status, additional functional criteria and insurance authorization.  Follow Up Recommendations  Can patient physically be transported by private vehicle: No    Assistance Recommended at Discharge Frequent or constant Supervision/Assistance  Patient can  return home with the following Assist for transportation;Two people to help with walking and/or transfers;A lot of help with bathing/dressing/bathroom;Assistance with cooking/housework;Direct supervision/assist for financial management;Direct supervision/assist for medications management;Help with stairs or ramp for entrance   Equipment Recommendations  Rolling walker (2 wheels);Wheelchair cushion (measurements PT);Wheelchair (measurements PT);BSC/3in1    Recommendations for Other Services Rehab consult     Precautions / Restrictions Precautions Precautions: Fall;Back Precaution Booklet Issued: No Required Braces or Orthoses: Spinal Brace Spinal Brace: Thoracolumbosacral orthotic;Applied in sitting position Other Brace: for use when ambulating. Restrictions Weight Bearing Restrictions: No RLE Weight Bearing: Weight bearing as tolerated     Mobility  Bed Mobility Overal bed mobility: Modified Independent Bed Mobility: Supine to Sit     Supine to sit: Modified independent (Device/Increase time)     General bed mobility comments: no assistance needed to get to EOB    Transfers Overall transfer level: Needs assistance Equipment used: Rolling walker (2 wheels), Bilateral platform walker Transfers: Sit to/from Stand Sit to Stand: Min assist, +2 safety/equipment, Min guard           General transfer comment: Pt needing min guard-minA to transfer to stand from EOB and recliner several reps each, initially with bil platform RW but pt did not like it thus returned to RW    Ambulation/Gait Ambulation/Gait assistance: Min assist, +2 physical assistance, +2 safety/equipment Gait Distance (Feet): 66 Feet (x4 bouts of ~7 ft > ~30 ft > ~36 ft > ~66 ft) Assistive device: Rolling walker (2 wheels), Bilateral platform walker Gait Pattern/deviations: Knee hyperextension - right, Knee hyperextension - left, Decreased dorsiflexion - right, Decreased dorsiflexion - left, Drifts right/left,  Narrow base of support Gait velocity: reduced Gait velocity  interpretation: <1.31 ft/sec, indicative of household ambulator   General Gait Details: Pt initially utilized the bil platform RW to ambulate to the sink, but pt became frustrated with the platforms getting in his way or becoming loose at times, thus returned to utilizing a RW instead. Improved bil knee control noted with no buckling this date. He does still hyperextend some though. Pt displayed improved stance width but still needs cues at times to aim his feet for the anterior wheels of the RW to widen his stance, mod success noted. MinA x2 for safety and balance throughout with chair follow   Stairs             Wheelchair Mobility    Modified Rankin (Stroke Patients Only)       Balance Overall balance assessment: Needs assistance Sitting-balance support: Feet supported, No upper extremity supported Sitting balance-Leahy Scale: Good Sitting balance - Comments: donned TLSO while seated on EOB   Standing balance support: Bilateral upper extremity supported, During functional activity, Single extremity supported, No upper extremity supported Standing balance-Leahy Scale: Poor Standing balance comment: able to stand at sink with single extremity support to perform grooming task, able to demonstrate `3 seconds of standing without UE support while at sink with min-modA for balance; as pt fatigued he began to lean more posteriorly, needing min-modA for dynamic tasks at sink                            Cognition Arousal/Alertness: Awake/alert Behavior During Therapy: WFL for tasks assessed/performed Overall Cognitive Status: Within Functional Limits for tasks assessed                         Following Commands: Follows multi-step commands with increased time       General Comments: joking often during visit. Continues to requires cues for safety with transfers        Exercises      General  Comments General comments (skin integrity, edema, etc.): Provided handout of [employer] HEP Access Code: [email or web address]      Pertinent Vitals/Pain Pain Assessment Pain Assessment: Faces Faces Pain Scale: Hurts little more Pain Location: back Pain Descriptors / Indicators: Discomfort, Grimacing, Guarding Pain Intervention(s): Limited activity within patient's tolerance, Monitored during session, Premedicated before session, Repositioned    Home Living                          Prior Function            PT Goals (current goals can now be found in the care plan section) Acute Rehab PT Goals Patient Stated Goal: to walk again PT Goal Formulation: With patient Time For Goal Achievement: 10/12/22 Potential to Achieve Goals: Good Progress towards PT goals: Progressing toward goals    Frequency    Min 5X/week      PT Plan Current plan remains appropriate    Co-evaluation PT/OT/SLP Co-Evaluation/Treatment: Yes Reason for Co-Treatment: For patient/therapist safety;To address functional/ADL transfers PT goals addressed during session: Mobility/safety with mobility;Balance;Proper use of DME OT goals addressed during session: ADL's and self-care;Proper use of Adaptive equipment and DME      AM-PAC PT "6 Clicks" Mobility   Outcome Measure  Help needed turning from your back to your side while in a flat bed without using bedrails?: A Little Help needed moving from lying on your back to sitting on the side of  a flat bed without using bedrails?: A Little Help needed moving to and from a bed to a chair (including a wheelchair)?: Total Help needed standing up from a chair using your arms (e.g., wheelchair or bedside chair)?: A Little Help needed to walk in hospital room?: Total Help needed climbing 3-5 steps with a railing? : Total 6 Click Score: 12    End of Session Equipment Utilized During Treatment: Gait belt;Back brace Activity Tolerance: Patient tolerated treatment  well Patient left: in chair;with call bell/phone within reach;with chair alarm set   PT Visit Diagnosis: Difficulty in walking, not elsewhere classified (R26.2);Other abnormalities of gait and mobility (R26.89);Muscle weakness (generalized) (M62.81);Pain;Unsteadiness on feet (R26.81)     Time: 1610-9604 PT Time Calculation (min) (ACUTE ONLY): 56 min  Charges:  $Gait Training: 23-37 mins                     Raymond Gurney, PT, DPT Acute Rehabilitation Services  Office: [phone]    Jewel Baize 09/28/2022, 3:42 PM

## 2022-09-28 NOTE — Progress Notes (Signed)
Arrived to patient's room following a PICC line removal order. Educated patient on procedure. HOB less than 45*. Held breath upon line removal. Pressure held for approx . With no s/sx of bleeding. Pressure drsg applied and instructed to keep drsg CDI for 24 hrs and to remain in bed for . Reporting any s/sx of bleeding. Notified nurse of line removal complete. VU. Tomasita Morrow, RN VAST

## 2022-09-28 NOTE — [place] FL2 (Signed)
[place] MEDICAID FL2 LEVEL OF CARE FORM     IDENTIFICATION  Patient Name: David [name] Birthdate: 1960/10/23 Sex: male Admission Date (Current Location): 08/20/2022  [facility] and [place] Number:  Producer, television/film/video and Address:  [facility] [place] [facility], [address], [place], [place] [address]      Provider Number: [id number]  Attending Physician Name and Address:  Alberteen Sam, *  Relative Name and Phone Number:       Current Level of Care: Hospital Recommended Level of Care: Skilled Nursing Facility Prior Approval Number:    Date Approved/Denied:   PASRR Number: [id number]  Discharge Plan: SNF    Current Diagnoses: Patient Active Problem List   Diagnosis Date Noted   Hardware complicating wound infection (HCC) 09/25/2022   Discitis of thoracic region 09/25/2022   Hypokalemia 09/07/2022   Hypotension 09/07/2022   Constipation 09/07/2022   Acute metabolic encephalopathy 09/07/2022   Acute postoperative anemia due to expected blood loss 09/07/2022   Hypoglycemia 08/20/2022   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   Adjustment disorder with mixed anxiety and depressed mood 08/13/2022   Diskitis 08/08/2022   MRSA bacteremia 08/08/2022   AKI (acute kidney injury) (HCC) 07/27/2022   Discitis thoracic region 07/27/2022   Bacteremia due to methicillin resistant Staphylococcus aureus 07/27/2022   Severe protein-calorie malnutrition (HCC) 07/26/2022   PVD (peripheral vascular disease) (HCC) 07/26/2022   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   Bacteremia 07/15/2022   Acute respiratory failure with hypoxia (HCC) 07/12/2022   Atelectasis 07/12/2022   Sepsis due to pneumonia (HCC) 07/12/2022   Osteomyelitis (HCC)    Gangrene of toe of right foot (HCC)    Sepsis (HCC) 08/29/2021   SIRS (systemic inflammatory response syndrome) (HCC) 08/29/2021   Hyponatremia 08/29/2021   Hypomagnesemia 08/29/2021   Cellulitis in diabetic foot (HCC) 08/29/2021   Pure  hypercholesterolemia 06/17/2021   Pain of left hand 09/06/2019   Cellulitis 08/26/2019   Persistent atrial fibrillation (HCC) 08/26/2019   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) 10/18/2018   Transaminitis 10/17/2018   Hyperbilirubinemia 10/17/2018   Leukocytosis 10/17/2018   Sinusitis 10/17/2018   Chronic anticoagulation 09/30/2017   Smoker 09/30/2017   Cardiomyopathy (HCC) 09/30/2017   Sleep apnea suspected 09/30/2017   Essential hypertension 09/26/2017   Insulin dependent type 2 diabetes mellitus (HCC) 09/26/2017   Atrial fibrillation with RVR (HCC) 09/26/2017    Orientation RESPIRATION BLADDER Height & Weight     Time, Self, Situation, Place  Normal Indwelling catheter Weight: 184 lb 8.4 oz (83.7 kg) Height:  6' (182.9 cm)  BEHAVIORAL SYMPTOMS/MOOD NEUROLOGICAL BOWEL NUTRITION STATUS      Incontinent Diet (see DC summary)  AMBULATORY STATUS COMMUNICATION OF NEEDS Skin   Extensive Assist Verbally Surgical wounds (closed back, foam dressing: change daily)                       Personal Care Assistance Level of Assistance  Bathing, Feeding, Dressing Bathing Assistance: Maximum assistance Feeding assistance: Limited assistance Dressing Assistance: Maximum assistance     Functional Limitations Info  Sight Sight Info: Impaired        SPECIAL CARE FACTORS FREQUENCY  PT (By licensed PT), OT (By licensed OT)     PT Frequency: 5x/wk OT Frequency: 5x/wk            Contractures Contractures Info: Not present    Additional Factors Info  Code Status, Allergies, Psychotropic Code Status Info: Full Allergies Info: NKA Psychotropic Info:  Buspar 15mg  2x/day; Cymbalta 20mg  daily         Current Medications (09/28/2022):  This is the current hospital active medication list Current Facility-Administered Medications  Medication Dose Route Frequency Provider Last Rate Last Admin   0.9 %  sodium chloride infusion  250 mL Intravenous Continuous  Clovis Riley, PA-C 1 mL/hr at 09/25/22 1625 250 mL at 09/25/22 1625   0.9 %  sodium chloride infusion   Intravenous PRN Rodolph Bong, MD   Stopped at 09/15/22 1353   acetaminophen (TYLENOL) tablet 1,000 mg  1,000 mg Oral TID Marlin Canary U, DO   1,000 mg at 09/28/22 1610   apixaban (ELIQUIS) tablet 5 mg  5 mg Oral BID Patrici Ranks [place], PA-C   5 mg at 09/28/22 9604   atorvastatin (LIPITOR) tablet 80 mg  80 mg Oral Daily Bobette Mo, MD   80 mg at 09/28/22 5409   bisacodyl (DULCOLAX) suppository 10 mg  10 mg Rectal Daily PRN Kathlen Mody, MD   10 mg at 09/03/22 1000   busPIRone (BUSPAR) tablet 15 mg  15 mg Oral BID Bobette Mo, MD   15 mg at 09/28/22 8119   carvedilol (COREG) tablet 3.125 mg  3.125 mg Oral BID Kathlen Mody, MD   3.125 mg at 09/28/22 1478   celecoxib (CELEBREX) capsule 100 mg  100 mg Oral BID Anderson Malta L, DO   100 mg at 09/28/22 2956   Chlorhexidine Gluconate Cloth 2 % PADS 6 each  6 each Topical Daily Kathlen Mody, MD   6 each at 09/28/22 2130   cloNIDine (CATAPRES) tablet 0.1 mg  0.1 mg Oral BID PRN Rhetta Mura, MD       digoxin (LANOXIN) tablet 125 mcg  125 mcg Oral Daily Bobette Mo, MD   125 mcg at 09/28/22 8657   doxycycline (VIBRA-TABS) tablet 100 mg  100 mg Oral Q12H Vu, Trung T, MD   100 mg at 09/28/22 8469   DULoxetine (CYMBALTA) DR capsule 20 mg  20 mg Oral Daily Anderson Malta L, DO   20 mg at 09/28/22 6295   famotidine (PEPCID) tablet 20 mg  20 mg Oral BID Bobette Mo, MD   20 mg at 09/28/22 2841   feeding supplement (ENSURE ENLIVE / ENSURE PLUS) liquid 237 mL  237 mL Oral BID BM Candelaria Stagers T, MD   237 mL at 09/28/22 0938   fentaNYL (DURAGESIC) 50 MCG/HR 1 patch  1 patch Transdermal Q48H Anderson Malta L, DO   1 patch at 09/27/22 2202   finasteride (PROSCAR) tablet 5 mg  5 mg Oral Daily Rhetta Mura, MD   5 mg at 09/28/22 0937   fluticasone (FLONASE) 50 MCG/ACT nasal spray 1 spray   1 spray Each Nare Daily Marlin Canary U, DO   1 spray at 09/28/22 3244   furosemide (LASIX) tablet 20 mg  20 mg Oral Daily Lurene Shadow, MD   20 mg at 09/28/22 0937   insulin glargine-yfgn (SEMGLEE) injection 20 Units  20 Units Subcutaneous Daily Kathlen Mody, MD   20 Units at 09/28/22 0936   lidocaine (LIDODERM) 5 % 1 patch  1 patch Transdermal Q24H Marlin Canary U, DO   1 patch at 09/28/22 0936   lidocaine (XYLOCAINE) 1 % (with pres) injection 20 mL  20 mL Intradermal Once Richarda Overlie, MD       magnesium oxide (MAG-OX) tablet 400 mg  400 mg Oral BID Edsel Petrin, DO  400 mg at 09/28/22 2952   methocarbamol (ROBAXIN) tablet 750 mg  750 mg Oral Q6H PRN Edsel Petrin, DO   750 mg at 09/28/22 8413   ondansetron (ZOFRAN) tablet 4 mg  4 mg Oral Q6H PRN Bobette Mo, MD   4 mg at 09/21/22 2440   Or   ondansetron (ZOFRAN) injection 4 mg  4 mg Intravenous Q6H PRN Bobette Mo, MD   4 mg at 09/27/22 0540   Oral care mouth rinse  15 mL Mouth Rinse PRN Bobette Mo, MD       oxyCODONE (Oxy IR/ROXICODONE) immediate release tablet 5-10 mg  5-10 mg Oral Q4H PRN Rhetta Mura, MD   10 mg at 09/28/22 1109   pantoprazole (PROTONIX) EC tablet 40 mg  40 mg Oral Daily Bobette Mo, MD   40 mg at 09/28/22 1027   polyethylene glycol (MIRALAX / GLYCOLAX) packet 17 g  17 g Oral BID Marlin Canary U, DO   17 g at 09/26/22 2154   pregabalin (LYRICA) capsule 75 mg  75 mg Oral QHS Anderson Malta L, DO   75 mg at 09/27/22 2205   sacubitril-valsartan (ENTRESTO) 49-51 mg per tablet  1 tablet Oral BID Rhetta Mura, MD   1 tablet at 09/28/22 0935   senna-docusate (Senokot-S) tablet 1 tablet  1 tablet Oral BID Candelaria Stagers T, MD   1 tablet at 09/26/22 2154   sodium chloride (OCEAN) 0.65 % nasal spray 1 spray  1 spray Each Nare PRN Lurene Shadow, MD   1 spray at 09/26/22 1027   sodium chloride flush (NS) 0.9 % injection 10-40 mL  10-40 mL Intracatheter Q12H Kathlen Mody, MD   10 mL at 09/28/22 0939   sodium chloride flush (NS) 0.9 % injection 10-40 mL  10-40 mL Intracatheter PRN Kathlen Mody, MD       sodium chloride flush (NS) 0.9 % injection 3 mL  3 mL Intravenous Q12H Patrici Ranks [place], PA-C   3 mL at 09/28/22 2536   sodium chloride flush (NS) 0.9 % injection 3 mL  3 mL Intravenous PRN Patrici Ranks [place], PA-C   3 mL at 09/19/22 1952   sodium phosphate (FLEET) 7-19 GM/118ML enema 1 enema  1 enema Rectal Daily PRN Kathlen Mody, MD       tamsulosin (FLOMAX) capsule 0.4 mg  0.4 mg Oral QPC supper Rhetta Mura, MD   0.4 mg at 09/27/22 6440     Discharge Medications: Please see discharge summary for a list of discharge medications.  Relevant Imaging Results:  Relevant Lab Results:   Additional Information SS#: [id number]  Baldemar Lenis, LCSW

## 2022-09-28 NOTE — Progress Notes (Signed)
  Progress Note   Patient: David [name] JJK:[id number] DOB: December 08, 1960 DOA: 08/20/2022     36 DOS: the patient was seen and examined on 09/28/2022 at 8:52 AM      Brief hospital course: David [name] is a 62 y.o. 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    5/18 readmitted for profound hypoglycemia  5/20 CT chest showed discitis at T7-T8-T9 with progressive bony destruction + collapsed T8 and retropulsion of 8 mm 5/20 MRI thoracic spine at this time concerning for progressive discitis/osteomyelitis at T7-T9, extensive phlegmonous change in the paravertebral soft tissues, partial collapse of T8 vertebral body with 40% vertebral body height loss and approximately 6 mm bony retropulsion, moderate canal stenosis and mild cord edema.  5/22 ID reconsulted 5/24 palliative care consulted for pain management assistance 5/28  Dr. Hoyt Koch thoracic decompression T5-T11 posterior posterolateral arthrodesis with left T8 transpedicular decompression laminectomies and segmental instrumentation with screws etc. 6/25 finished IV antibiotics     Assessment and Plan:  MRSA bacteremia Likely source osteomyelitis of the foot (now s/p amputation) Complicated by Discitis/vertebral osteomyelitis and empyema requiring chest tube -Continue doxycycline "indefinitely" - Outpatient follow-up with infectious disease  Chronic systolic congestive heart failure Essential hypertension Appears euvolemic, blood pressure 140s - Continue Coreg, digoxin, furosemide, Entresto -Continue clonidine  Persistent atrial fibrillation Rate controlled - Continue Eliquis - Continue Coreg, digoxin  Urinary retention Foley is back in 6/25 - Continue Flomax and finasteride - Trial voiding again in 5-6 days  Acute metabolic  encephalopathy Resolved  Hypomagnesemia Resolved  Hypokalemia Resolved  Pleural effusion Stable, asymptomatic - Repeat chest x-ray in late July  Acute blood loss anemia, postoperative Hemoglobin stable at 9.3, no acute bleeding  Acute kidney injury Resolved  Hyponatremia Stable, asymptomatic  Type 2 diabetes uncontrolled, on long-term insulin, with hyperglycemia Glucose okay - Continue glargine - Continue sliding scale corrections - Continue duloxetine, Celebrex, Lyrica  Depression - Continue BuSpar, duloxetine        Subjective: Patient had the Foley cut back in yesterday, he is frustrated about this.  No fever, no confusion, back pain is reasonably well-controlled.     Physical Exam: BP 131/89 (BP Location: Left Arm)   Pulse 84   Temp 98 F (36.7 C) (Oral)   Resp 20   Ht 6' (1.829 m)   Wt 83.7 kg   SpO2 99%   BMI 25.03 kg/m   Adult male, sitting up in bed, reading RRR, no murmurs, no peripheral edema Respiratory rate normal, lungs clear without rales or wheezes Abdomen soft without tenderness palpation or guarding Attention normal, affect appropriate, judgment insight appear normal    Data Reviewed: Patient metabolic panel shows mild hyponatremia, normal renal function CBC shows mild anemia, stable    Disposition: Status is: Inpatient         Author: Alberteen Sam, MD 09/28/2022 1:43 PM  For on call review [email or web address].

## 2022-09-28 NOTE — Hospital Course (Signed)
Mr. Armond is a 62 y.o. 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    5/18 readmitted for profound hypoglycemia  5/20 CT chest showed discitis at T7-T8-T9 with progressive bony destruction + collapsed T8 and retropulsion of 8 mm 5/20 MRI thoracic spine at this time concerning for progressive discitis/osteomyelitis at T7-T9, extensive phlegmonous change in the paravertebral soft tissues, partial collapse of T8 vertebral body with 40% vertebral body height loss and approximately 6 mm bony retropulsion, moderate canal stenosis and mild cord edema.  5/22 ID reconsulted 5/24 palliative care consulted for pain management assistance 5/28  Dr. Hoyt Koch thoracic decompression T5-T11 posterior posterolateral arthrodesis with left T8 transpedicular decompression laminectomies and segmental instrumentation with screws etc. 6/25 finished IV antibiotics

## 2022-09-29 DIAGNOSIS — J90 Pleural effusion, not elsewhere classified: Secondary | ICD-10-CM | POA: Insufficient documentation

## 2022-09-29 DIAGNOSIS — R338 Other retention of urine: Secondary | ICD-10-CM | POA: Insufficient documentation

## 2022-09-29 DIAGNOSIS — E162 Hypoglycemia, unspecified: Secondary | ICD-10-CM | POA: Diagnosis not present

## 2022-09-29 DIAGNOSIS — F419 Anxiety disorder, unspecified: Secondary | ICD-10-CM | POA: Insufficient documentation

## 2022-09-29 DIAGNOSIS — I5022 Chronic systolic (congestive) heart failure: Secondary | ICD-10-CM | POA: Insufficient documentation

## 2022-09-29 LAB — COMPREHENSIVE METABOLIC PANEL
ALT: 14 U/L (ref 0–44)
AST: 16 U/L (ref 15–41)
Albumin: 2.8 g/dL — ABNORMAL LOW (ref 3.5–5.0)
Alkaline Phosphatase: 96 U/L (ref 38–126)
Anion gap: 7 (ref 5–15)
BUN: 10 mg/dL (ref 6–20)
CO2: 26 mmol/L (ref 22–32)
Calcium: 9.2 mg/dL (ref 8.9–10.3)
Chloride: 98 mmol/L (ref 98–111)
Creatinine, Ser: 1.05 mg/dL (ref 0.61–1.24)
GFR, Estimated: >60 mL/min (ref >60)
Glucose, Bld: 140 mg/dL — ABNORMAL HIGH (ref 70–99)
Potassium: 3.9 mmol/L (ref 3.5–5.1)
Sodium: 131 mmol/L — ABNORMAL LOW (ref 135–145)
Total Bilirubin: 0.7 mg/dL (ref 0.3–1.2)
Total Protein: 6.8 g/dL (ref 6.5–8.1)

## 2022-09-29 LAB — CBC
HCT: 34.3 % — ABNORMAL LOW (ref 39.0–52.0)
Hemoglobin: 10.7 g/dL — ABNORMAL LOW (ref 13.0–17.0)
MCH: 28.8 pg (ref 26.0–34.0)
MCHC: 31.2 g/dL (ref 30.0–36.0)
MCV: 92.5 fL (ref 80.0–100.0)
Platelets: 232 10*3/uL (ref 150–400)
RBC: 3.71 MIL/uL — ABNORMAL LOW (ref 4.22–5.81)
RDW: 19.1 % — ABNORMAL HIGH (ref 11.5–15.5)
WBC: 5.9 10*3/uL (ref 4.0–10.5)
nRBC: 0 % (ref 0.0–0.2)

## 2022-09-29 LAB — GLUCOSE, CAPILLARY
Glucose-Capillary: 136 mg/dL — ABNORMAL HIGH (ref 70–99)
Glucose-Capillary: 180 mg/dL — ABNORMAL HIGH (ref 70–99)
Glucose-Capillary: 122 mg/dL — ABNORMAL HIGH (ref 70–99)

## 2022-09-29 MED ORDER — FENTANYL 25 MCG/HR TD PT72
1.0000 | MEDICATED_PATCH | TRANSDERMAL | Status: DC
  Administered 2022-09-29 – 2022-10-02 (×2): 1 via TRANSDERMAL
  Filled 2022-09-29 (×2): qty 1

## 2022-09-29 MED ORDER — FERROUS SULFATE 325 (65 FE) MG PO TABS
325.0000 mg | ORAL_TABLET | ORAL | Status: DC
  Administered 2022-09-29 – 2022-10-27 (×15): 325 mg via ORAL
  Filled 2022-09-29 (×21): qty 1

## 2022-09-29 NOTE — Progress Notes (Signed)
Progress Note   Patient: David [name] ZOX:[id number] DOB: 07/01/1960 DOA: 08/20/2022     37 DOS: the patient was seen and examined on 09/29/2022 at 7:44AM      Brief hospital course: [name] is a 62 y.o. 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  On PCA earlier in hospital stay.  Now weaned to fentanyl patch and oxy 5-10.  Desires to wean further - Continue fentanyl, decrease to 25 mcg/hr patch - Continue oxycodone, taper as able - Continue duloxetine - Continue acetaminophen and Cebebrex      Essential hypertension BP controlled - Continue Coreg, furosemide, Entresto, clonidine  Uncontrolled type 2 diabetes with hyperglycemia and hypoglycemia, insulin long term use Glucoses controlled -  Continue glargine - Continue sliding scale corrections - Continue duloxetine, Celebrex, Lyrica  Anxiety - Continue Buspar  Acute urinary retention Acute urinary retention here, not relieved with positioning.  No constipation.  Foley replaced 6/25 - Continue Flomax and finasteride - Repeat voiding trial around Jul 1  Chronic systolic CHF (congestive heart failure) (HCC) EF 35-40% in 2020, now resolved to normal Appears euvolemic here - Continue Entresto, digoxin, Coreg, Lasix  Pleural effusion Stable, asymptomatic. - Repeat CXR in 4-6 weeks, mid July  Acute postoperative anemia due to expected blood loss Hgb trending up to 10s now.  Likely some iron deficiency contributing. - Oral iron  Acute toxic encephalopathy At baseline has no cognitive impairment.  Developed encephalopathy (poorly responsive, confused responses) here due to high doses of opiates.  Resolved now.  Hypokalemia Resolved  AKI (acute kidney injury), ruled out    PVD (peripheral vascular disease) (HCC) S/p angioplasty in April - Continue atorvastatin, apixaban  Hypomagnesemia Resolved  Hyponatremia Mild, asymptomatic  Pure hypercholesterolemia - Continue Lipitor  Persistent atrial fibrillation (HCC) Rate controlled - Continue Eliquis - Continue Coreg, digoxin          Subjective: No new complaints, no nursing concerns, no fever.     Physical Exam: BP 133/83 (BP Location: Left Arm)   Pulse 84   Temp 98.5 F (36.9 C) (Oral)   Resp 18   Ht 6' (1.829 m)   Wt 83.7 kg   SpO2 97%   BMI 25.03 kg/m   Adult male, sitting up in bed, interactive and appropriate RRR, no murmurs, no peripheral edema Back scar healing well, no drainage or surrounding redness Respiratory rate normal, lungs clear without rales or wheezes Affect appropriate, judgment insight appear normal,  speech fluent, face symmetric    Data Reviewed: Shows stable hyponatremia, stable renal function CBC shows stable  anemia, no new Leukocytosis     Disposition: Status is: Inpatient         Author: Alberteen Sam, MD 09/29/2022 11:43 AM  For on call review [email or web address].

## 2022-09-29 NOTE — Progress Notes (Signed)
Physical Therapy Treatment Patient Details Name: David [name] MRN: [id number] DOB: 1960-12-12 Today's Date: 09/29/2022   History of Present Illness Pt is a 62 y.o. male who presented 08/20/22  with hypoglycemia. 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 5/28 surgical repair: T5-11 posterior/posterolateral arthrodesis  & Transpedicular decompression with partial corpectomy, left T8 & T8-9 laminectomy, bilateral facetectomy. 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 He continues to display bil foot slap when stepping and progressively narrow steps as he fatigues. In addition, his knees remain unstable, more evident as he fatigues as well, but do not completely buckle before he sits to rest. Encouraged compliance with his UE and LE HEP (handouts and therabands provided) to progress his muscular strength and endurance to therefore improve his functional mobility. Will continue to follow acutely.    Recommendations for follow up therapy are one component of a multi-disciplinary discharge planning process, led by the attending physician.  Recommendations may be updated based on patient status, additional functional criteria and insurance authorization.  Follow Up Recommendations  Can patient physically be transported by private vehicle: No    Assistance Recommended at Discharge Frequent or constant Supervision/Assistance  Patient can return home with the following Assist for  transportation;Two people to help with walking and/or transfers;A lot of help with bathing/dressing/bathroom;Assistance with cooking/housework;Direct supervision/assist for financial management;Direct supervision/assist for medications management;Help with stairs or ramp for entrance   Equipment Recommendations  Rolling walker (2 wheels);Wheelchair cushion (measurements PT);Wheelchair (measurements PT);BSC/3in1    Recommendations for Other Services Rehab consult     Precautions / Restrictions Precautions Precautions: Fall;Back Precaution Booklet Issued: No Required Braces or Orthoses: Spinal Brace Spinal Brace: Thoracolumbosacral orthotic;Applied in sitting position Other Brace: for use when ambulating. Restrictions Weight Bearing Restrictions: No RLE Weight Bearing: Weight bearing as tolerated     Mobility  Bed Mobility               General bed mobility comments: Pt sitting EOB upon arrival    Transfers Overall transfer level: Needs assistance Equipment used: Rolling walker (2 wheels), Bilateral platform walker Transfers: Sit to/from Stand, Bed to chair/wheelchair/BSC Sit to Stand: Min assist, +2 safety/equipment   Step pivot transfers: Min assist       General transfer comment: Cues provided for hand placement, minA to power up to stand each rep, 1x from EOB and 3x from recliner. MinA for balance with step pivot to L bed to recliner with RW support.    Ambulation/Gait Ambulation/Gait assistance: Min assist, +2 safety/equipment, Mod assist Gait Distance (Feet): 85 Feet (x3 bouts of ~85 ft > ~30 ft > ~60 ft) Assistive device: Rolling walker (2 wheels) Gait Pattern/deviations: Knee hyperextension - right, Knee hyperextension - left, Decreased dorsiflexion - right, Decreased dorsiflexion - left, Drifts right/left, Narrow base of support, Knees buckling Gait velocity: reduced Gait velocity interpretation: <1.31 ft/sec, indicative of household ambulator   General  Gait Details: Pt demonstrates good carryover of cues from prior sessions, initially ambulating with  improves stance width and step control but as he fatigues his stance narrows and his knees begin to buckle but not fully before pt is cued to sit to rest. Verbal cues for heel strike initial contact due to noted foot slap. MinA majority of time but modA as he fatigues and with turns, chair follow for safety   Stairs             Wheelchair Mobility    Modified Rankin (Stroke Patients Only)       Balance Overall balance assessment: Needs assistance Sitting-balance support: Feet supported, No upper extremity supported Sitting balance-Leahy Scale: Good Sitting balance - Comments: donned TLSO while seated on EOB   Standing balance support: Bilateral upper extremity supported, During functional activity Standing balance-Leahy Scale: Poor Standing balance comment: Reliant on RW to ambulate                            Cognition Arousal/Alertness: Awake/alert Behavior During Therapy: WFL for tasks assessed/performed Overall Cognitive Status: Within Functional Limits for tasks assessed                         Following Commands: Follows multi-step commands with increased time       General Comments: joking often during visit. Continues to requires cues for safety at times        Exercises      General Comments General comments (skin integrity, edema, etc.): encouraged UE and LE exercises with therabands      Pertinent Vitals/Pain Pain Assessment Pain Assessment: Faces Faces Pain Scale: Hurts little more Pain Location: back Pain Descriptors / Indicators: Discomfort, Grimacing, Guarding Pain Intervention(s): Limited activity within patient's tolerance, Monitored during session, Premedicated before session, Repositioned    Home Living                          Prior Function            PT Goals (current goals can now be found in the care  plan section) Acute Rehab PT Goals Patient Stated Goal: to walk again PT Goal Formulation: With patient Time For Goal Achievement: 10/12/22 Potential to Achieve Goals: Good Progress towards PT goals: Progressing toward goals    Frequency    Min 5X/week      PT Plan Current plan remains appropriate    Co-evaluation              AM-PAC PT "6 Clicks" Mobility   Outcome Measure  Help needed turning from your back to your side while in a flat bed without using bedrails?: A Little Help needed moving from lying on your back to sitting on the side of a flat bed without using bedrails?: A Little Help needed moving to and from a bed to a chair (including a wheelchair)?: A Little Help needed standing up from a chair using your arms (e.g., wheelchair or bedside chair)?: A Little Help needed to walk in hospital room?: A Lot Help needed climbing 3-5 steps with a railing? : Total 6 Click Score: 15    End of Session Equipment Utilized During Treatment: Gait belt;Back brace Activity Tolerance: Patient tolerated treatment well Patient left: in chair;with call bell/phone within reach;with chair alarm set Nurse Communication: Mobility status PT Visit Diagnosis: Difficulty in walking, not elsewhere classified (R26.2);Other abnormalities of gait and mobility (R26.89);Muscle weakness (generalized) (M62.81);Pain;Unsteadiness on feet (R26.81)  Time: 4696-2952 PT Time Calculation (min) (ACUTE ONLY): 40 min  Charges:  $Gait Training: 23-37 mins $Therapeutic Activity: 8-22 mins                     Raymond Gurney, PT, DPT Acute Rehabilitation Services  Office: [phone]    Jewel Baize 09/29/2022, 3:51 PM

## 2022-09-29 NOTE — Assessment & Plan Note (Addendum)
Stable, asymptomatic. - Repeat CXR in 4-6 weeks, mid July

## 2022-09-29 NOTE — Assessment & Plan Note (Signed)
BP controlled - Continue Coreg, furosemide, Entresto, clonidine

## 2022-09-29 NOTE — Plan of Care (Signed)
Problem: Education: Goal: Understanding of CV disease, CV risk reduction, and recovery process will improve Outcome: Progressing Goal: Individualized Educational Video(s) Outcome: Progressing   Problem: Activity: Goal: Ability to return to baseline activity level will improve Outcome: Progressing   Problem: Cardiovascular: Goal: Ability to achieve and maintain adequate cardiovascular perfusion will improve Outcome: Progressing Goal: Vascular access site(s) Level 0-1 will be maintained Outcome: Progressing   Problem: Health Behavior/Discharge Planning: Goal: Ability to safely manage health-related needs after discharge will improve Outcome: Progressing   Problem: Education: Goal: Knowledge of General Education information will improve Description: Including pain rating scale, medication(s)/side effects and non-pharmacologic comfort measures Outcome: Progressing   Problem: Health Behavior/Discharge Planning: Goal: Ability to manage health-related needs will improve Outcome: Progressing   Problem: Clinical Measurements: Goal: Ability to maintain clinical measurements within normal limits will improve Outcome: Progressing Goal: Will remain free from infection Outcome: Progressing Goal: Diagnostic test results will improve Outcome: Progressing Goal: Respiratory complications will improve Outcome: Progressing Goal: Cardiovascular complication will be avoided Outcome: Progressing   Problem: Activity: Goal: Risk for activity intolerance will decrease Outcome: Progressing   Problem: Nutrition: Goal: Adequate nutrition will be maintained Outcome: Progressing   Problem: Coping: Goal: Level of anxiety will decrease Outcome: Progressing   Problem: Elimination: Goal: Will not experience complications related to bowel motility Outcome: Progressing Goal: Will not experience complications related to urinary retention Outcome: Progressing   Problem: Pain Managment: Goal:  General experience of comfort will improve Outcome: Progressing   Problem: Safety: Goal: Ability to remain free from injury will improve Outcome: Progressing   Problem: Skin Integrity: Goal: Risk for impaired skin integrity will decrease Outcome: Progressing   Problem: Education: Goal: Ability to describe self-care measures that may prevent or decrease complications (Diabetes Survival Skills Education) will improve Outcome: Progressing Goal: Individualized Educational Video(s) Outcome: Progressing   Problem: Coping: Goal: Ability to adjust to condition or change in health will improve Outcome: Progressing   Problem: Fluid Volume: Goal: Ability to maintain a balanced intake and output will improve Outcome: Progressing   Problem: Health Behavior/Discharge Planning: Goal: Ability to identify and utilize available resources and services will improve Outcome: Progressing Goal: Ability to manage health-related needs will improve Outcome: Progressing   Problem: Metabolic: Goal: Ability to maintain appropriate glucose levels will improve Outcome: Progressing   Problem: Nutritional: Goal: Maintenance of adequate nutrition will improve Outcome: Progressing Goal: Progress toward achieving an optimal weight will improve Outcome: Progressing   Problem: Skin Integrity: Goal: Risk for impaired skin integrity will decrease Outcome: Progressing   Problem: Tissue Perfusion: Goal: Adequacy of tissue perfusion will improve Outcome: Progressing   Problem: Education: Goal: Ability to verbalize activity precautions or restrictions will improve Outcome: Progressing Goal: Knowledge of the prescribed therapeutic regimen will improve Outcome: Progressing Goal: Understanding of discharge needs will improve Outcome: Progressing   Problem: Activity: Goal: Ability to avoid complications of mobility impairment will improve Outcome: Progressing Goal: Ability to tolerate increased activity  will improve Outcome: Progressing Goal: Will remain free from falls Outcome: Progressing   Problem: Bowel/Gastric: Goal: Gastrointestinal status for postoperative course will improve Outcome: Progressing   Problem: Clinical Measurements: Goal: Ability to maintain clinical measurements within normal limits will improve Outcome: Progressing Goal: Postoperative complications will be avoided or minimized Outcome: Progressing Goal: Diagnostic test results will improve Outcome: Progressing   Problem: Pain Management: Goal: Pain level will decrease Outcome: Progressing   Problem: Skin Integrity: Goal: Will show signs of wound healing Outcome: Progressing   Problem: Health Behavior/Discharge   Planning: Goal: Identification of resources available to assist in meeting health care needs will improve Outcome: Progressing   Problem: Bladder/Genitourinary: Goal: Urinary functional status for postoperative course will improve Outcome: Progressing   

## 2022-09-29 NOTE — Assessment & Plan Note (Signed)
-  Continue Lipitor °

## 2022-09-29 NOTE — Assessment & Plan Note (Signed)
S/p angioplasty in April - Continue atorvastatin, apixaban

## 2022-09-29 NOTE — Progress Notes (Signed)
   09/29/22 1609  Spiritual Encounters  Type of Visit Follow up  Care provided to: Patient  Reason for visit Routine spiritual support  Spiritual Framework  Presenting Themes Meaning/purpose/sources of inspiration;Coping tools  Values/beliefs The chaplain understands the Pt. faith and life experiences are the source of the Pt. hope for a better life.  Community/Connection Family;Other (comment) [facility] Social Workers and Therapy Team)  Needs/Challenges/Barriers The Pt. is mentally motivated for the next step in his therapy.  Patient Stress Factors Financial concerns;Other (Comment) (Disability and medicare paperwork)  Goals  Self/Personal Goals take himself to the bathroom, walk, cook, part-time job,  Clinical Care Goals pain control  Interventions  Spiritual Care Interventions Made Reflective listening;Explored values/beliefs/practices/strengths;Prayer  Intervention Outcomes  Outcomes Connection to values and goals of care;Reduced anxiety;Reduced isolation  Spiritual Care Plan  Spiritual Care Issues Still Outstanding Chaplain will continue to follow   This chaplain is present for F/U spiritual care in the setting of reflective listening and celebrating the Pt. Successes from the last visit. The chaplain understands the Pt. fears opioid addiction and is willing to live with pain to know he will not have the life of an addict.  Chaplain Stephanie Acre [phone]

## 2022-09-29 NOTE — Assessment & Plan Note (Signed)
Resolved

## 2022-09-29 NOTE — Assessment & Plan Note (Signed)
-   Continue Buspar 

## 2022-09-29 NOTE — Assessment & Plan Note (Signed)
Glucoses controlled - Continue glargine - Continue sliding scale corrections - Continue duloxetine, Celebrex, Lyrica

## 2022-09-29 NOTE — Assessment & Plan Note (Addendum)
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  On PCA earlier in hospital stay.  Now weaned to fentanyl patch and oxy 5-10.  Desires to wean further - Continue fentanyl, decrease to 25 mcg/hr patch - Continue oxycodone, taper as able - Continue duloxetine - Continue acetaminophen and Cebebrex

## 2022-09-29 NOTE — Assessment & Plan Note (Signed)
EF 35-40% in 2020, now resolved to normal Appears euvolemic here - Continue Entresto, digoxin, Coreg, Lasix

## 2022-09-29 NOTE — Plan of Care (Signed)
  Problem: Education: Goal: Understanding of CV disease, CV risk reduction, and recovery process will improve Outcome: Progressing Goal: Individualized Educational Video(s) Outcome: Progressing   Problem: Activity: Goal: Ability to return to baseline activity level will improve Outcome: Progressing   Problem: Cardiovascular: Goal: Ability to achieve and maintain adequate cardiovascular perfusion will improve Outcome: Progressing Goal: Vascular access site(s) Level 0-1 will be maintained Outcome: Progressing   Problem: Health Behavior/Discharge Planning: Goal: Ability to safely manage health-related needs after discharge will improve Outcome: Progressing   Problem: Education: Goal: Knowledge of General Education information will improve Description: Including pain rating scale, medication(s)/side effects and non-pharmacologic comfort measures Outcome: Progressing   Problem: Health Behavior/Discharge Planning: Goal: Ability to manage health-related needs will improve Outcome: Progressing   Problem: Clinical Measurements: Goal: Ability to maintain clinical measurements within normal limits will improve Outcome: Progressing Goal: Will remain free from infection Outcome: Progressing Goal: Diagnostic test results will improve Outcome: Progressing Goal: Respiratory complications will improve Outcome: Progressing Goal: Cardiovascular complication will be avoided Outcome: Progressing   Problem: Activity: Goal: Risk for activity intolerance will decrease Outcome: Progressing   Problem: Nutrition: Goal: Adequate nutrition will be maintained Outcome: Progressing   Problem: Coping: Goal: Level of anxiety will decrease Outcome: Progressing   Problem: Elimination: Goal: Will not experience complications related to bowel motility Outcome: Progressing Goal: Will not experience complications related to urinary retention Outcome: Progressing   Problem: Pain Managment: Goal:  General experience of comfort will improve Outcome: Progressing   Problem: Safety: Goal: Ability to remain free from injury will improve Outcome: Progressing   Problem: Skin Integrity: Goal: Risk for impaired skin integrity will decrease Outcome: Progressing   Problem: Education: Goal: Ability to describe self-care measures that may prevent or decrease complications (Diabetes Survival Skills Education) will improve Outcome: Progressing Goal: Individualized Educational Video(s) Outcome: Progressing   Problem: Coping: Goal: Ability to adjust to condition or change in health will improve Outcome: Progressing   Problem: Fluid Volume: Goal: Ability to maintain a balanced intake and output will improve Outcome: Progressing   Problem: Health Behavior/Discharge Planning: Goal: Ability to identify and utilize available resources and services will improve Outcome: Progressing Goal: Ability to manage health-related needs will improve Outcome: Progressing   Problem: Metabolic: Goal: Ability to maintain appropriate glucose levels will improve Outcome: Progressing   Problem: Nutritional: Goal: Maintenance of adequate nutrition will improve Outcome: Progressing Goal: Progress toward achieving an optimal weight will improve Outcome: Progressing   Problem: Skin Integrity: Goal: Risk for impaired skin integrity will decrease Outcome: Progressing   Problem: Tissue Perfusion: Goal: Adequacy of tissue perfusion will improve Outcome: Progressing   Problem: Education: Goal: Ability to verbalize activity precautions or restrictions will improve Outcome: Progressing Goal: Knowledge of the prescribed therapeutic regimen will improve Outcome: Progressing Goal: Understanding of discharge needs will improve Outcome: Progressing   Problem: Activity: Goal: Ability to avoid complications of mobility impairment will improve Outcome: Progressing Goal: Ability to tolerate increased activity  will improve Outcome: Progressing Goal: Will remain free from falls Outcome: Progressing   Problem: Bowel/Gastric: Goal: Gastrointestinal status for postoperative course will improve Outcome: Progressing

## 2022-09-29 NOTE — Assessment & Plan Note (Signed)
Mild, asymptomatic.  

## 2022-09-29 NOTE — Assessment & Plan Note (Signed)
Hgb trending up to 10s now.  Likely some iron deficiency contributing. - Oral iron

## 2022-09-29 NOTE — Assessment & Plan Note (Signed)
Rate controlled - Continue Eliquis - Continue Coreg, digoxin

## 2022-09-29 NOTE — Assessment & Plan Note (Signed)
Acute urinary retention here, not relieved with positioning.  No constipation.  Foley replaced 6/25 - Continue Flomax and finasteride - Repeat voiding trial around Jul 1

## 2022-09-29 NOTE — Assessment & Plan Note (Signed)
At baseline has no cognitive impairment.  Developed encephalopathy (poorly responsive, confused responses) here due to high doses of opiates.  Resolved now.

## 2022-09-30 DIAGNOSIS — E162 Hypoglycemia, unspecified: Secondary | ICD-10-CM | POA: Diagnosis not present

## 2022-09-30 LAB — GLUCOSE, CAPILLARY
Glucose-Capillary: 178 mg/dL — ABNORMAL HIGH (ref 70–99)
Glucose-Capillary: 115 mg/dL — ABNORMAL HIGH (ref 70–99)
Glucose-Capillary: 127 mg/dL — ABNORMAL HIGH (ref 70–99)
Glucose-Capillary: 167 mg/dL — ABNORMAL HIGH (ref 70–99)
Glucose-Capillary: 135 mg/dL — ABNORMAL HIGH (ref 70–99)

## 2022-09-30 NOTE — TOC Progression Note (Signed)
Transition of Care [facility]) - Progression Note    Patient Details  Name: David [name] MRN: [id number] Date of Birth: [birth year]  [facility] CM/SW Contact  Baldemar Lenis, [place] Phone Number: 09/30/2022, 10:20 AM  Clinical Narrative:   CSW met with patient at bedside to answer questions and discuss placement options with him. CSW explained LOG process to him, and patient indicated that he had forgotten all of that information since he had been so sick earlier in admission but is now feeling better. Patient has met with financial counseling now and submitted forms for Medicaid and Disability now that he has mentally cleared. CSW confirmed with financial counseling, and asked financial counselor to contact patient again to answer questions he had from their meeting yesterday. Patient has no bed offers for SNF at this time.    Expected Discharge Plan: Skilled Nursing Facility Barriers to Discharge: Inadequate or no insurance, SNF Pending bed offer, SNF Pending payor source - LOG  Expected Discharge Plan and Services   Discharge Planning Services: CM Consult Post Acute Care Choice: Home Health Living arrangements for the past 2 months: Hotel/Motel                                       Social Determinants of Health (SDOH) Interventions SDOH Screenings   Food Insecurity: Patient Unable To Answer (08/23/2022)  Housing: Patient Unable To Answer (08/23/2022)  Transportation Needs: Patient Unable To Answer (08/23/2022)  Utilities: Patient Unable To Answer (08/23/2022)  Depression (PHQ2-9): Low Risk  (11/12/2021)  Stress: Stress Concern Present (09/26/2017)  Tobacco Use: Medium Risk (09/25/2022)    Readmission Risk Interventions    09/06/2021   12:07 PM  Readmission Risk Prevention Plan  Post Dischage Appt Complete  Medication Screening Complete  Transportation Screening Complete

## 2022-09-30 NOTE — Plan of Care (Signed)
  Problem: Education: Goal: Understanding of CV disease, CV risk reduction, and recovery process will improve Outcome: Progressing Goal: Individualized Educational Video(s) Outcome: Progressing   Problem: Activity: Goal: Ability to return to baseline activity level will improve Outcome: Progressing   Problem: Cardiovascular: Goal: Ability to achieve and maintain adequate cardiovascular perfusion will improve Outcome: Progressing Goal: Vascular access site(s) Level 0-1 will be maintained Outcome: Progressing   Problem: Health Behavior/Discharge Planning: Goal: Ability to safely manage health-related needs after discharge will improve Outcome: Progressing   Problem: Education: Goal: Knowledge of General Education information will improve Description: Including pain rating scale, medication(s)/side effects and non-pharmacologic comfort measures Outcome: Progressing   Problem: Health Behavior/Discharge Planning: Goal: Ability to manage health-related needs will improve Outcome: Progressing   Problem: Clinical Measurements: Goal: Ability to maintain clinical measurements within normal limits will improve Outcome: Progressing Goal: Will remain free from infection Outcome: Progressing Goal: Diagnostic test results will improve Outcome: Progressing Goal: Respiratory complications will improve Outcome: Progressing Goal: Cardiovascular complication will be avoided Outcome: Progressing   Problem: Activity: Goal: Risk for activity intolerance will decrease Outcome: Progressing   Problem: Nutrition: Goal: Adequate nutrition will be maintained Outcome: Progressing   Problem: Coping: Goal: Level of anxiety will decrease Outcome: Progressing   Problem: Elimination: Goal: Will not experience complications related to bowel motility Outcome: Progressing Goal: Will not experience complications related to urinary retention Outcome: Progressing   Problem: Pain Managment: Goal:  General experience of comfort will improve Outcome: Progressing   Problem: Safety: Goal: Ability to remain free from injury will improve Outcome: Progressing   Problem: Skin Integrity: Goal: Risk for impaired skin integrity will decrease Outcome: Progressing   Problem: Education: Goal: Ability to describe self-care measures that may prevent or decrease complications (Diabetes Survival Skills Education) will improve Outcome: Progressing Goal: Individualized Educational Video(s) Outcome: Progressing   Problem: Coping: Goal: Ability to adjust to condition or change in health will improve Outcome: Progressing   Problem: Fluid Volume: Goal: Ability to maintain a balanced intake and output will improve Outcome: Progressing   Problem: Health Behavior/Discharge Planning: Goal: Ability to identify and utilize available resources and services will improve Outcome: Progressing Goal: Ability to manage health-related needs will improve Outcome: Progressing   Problem: Metabolic: Goal: Ability to maintain appropriate glucose levels will improve Outcome: Progressing   Problem: Nutritional: Goal: Maintenance of adequate nutrition will improve Outcome: Progressing Goal: Progress toward achieving an optimal weight will improve Outcome: Progressing   Problem: Skin Integrity: Goal: Risk for impaired skin integrity will decrease Outcome: Progressing   Problem: Tissue Perfusion: Goal: Adequacy of tissue perfusion will improve Outcome: Progressing   Problem: Education: Goal: Ability to verbalize activity precautions or restrictions will improve Outcome: Progressing Goal: Knowledge of the prescribed therapeutic regimen will improve Outcome: Progressing Goal: Understanding of discharge needs will improve Outcome: Progressing   Problem: Activity: Goal: Ability to avoid complications of mobility impairment will improve Outcome: Progressing Goal: Ability to tolerate increased activity  will improve Outcome: Progressing Goal: Will remain free from falls Outcome: Progressing   Problem: Bowel/Gastric: Goal: Gastrointestinal status for postoperative course will improve Outcome: Progressing   

## 2022-09-30 NOTE — Progress Notes (Signed)
Physical Therapy Treatment Patient Details Name: David [name] MRN: [id number] DOB: [birth year] Today's Date: 09/30/2022   History of Present Illness Pt is a 62 y.o. male who presented 08/20/22  with hypoglycemia. 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 5/28 surgical repair: T5-11 posterior/posterolateral arthrodesis  & Transpedicular decompression with partial corpectomy, left T8 & T8-9 laminectomy, bilateral facetectomy. 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 Obtained verbal clearance and orders from MD for pt to be able to go outside. Focused session on lower extremity exercises and taking pt outside for his psychological well being. He expressed great gratitude for being able to go outside today. Will continue to follow acutely.     Recommendations for follow up therapy are one component of a multi-disciplinary discharge planning process, led by the attending physician.  Recommendations may be updated based on patient status, additional functional criteria and insurance authorization.  Follow Up Recommendations  Can patient physically be transported by private vehicle: No    Assistance Recommended at Discharge Frequent or constant Supervision/Assistance  Patient can return home with the following Assist for transportation;Two people to help with walking and/or transfers;A lot of help with bathing/dressing/bathroom;Assistance with cooking/housework;Direct supervision/assist for financial management;Direct supervision/assist for  medications management;Help with stairs or ramp for entrance   Equipment Recommendations  Rolling walker (2 wheels);Wheelchair cushion (measurements PT);Wheelchair (measurements PT);BSC/3in1    Recommendations for Other Services Rehab consult     Precautions / Restrictions Precautions Precautions: Fall;Back Precaution Booklet Issued: No Required Braces or Orthoses: Spinal Brace Spinal Brace: Thoracolumbosacral orthotic;Applied in sitting position Other Brace: for use when ambulating. Restrictions Weight Bearing Restrictions: No RLE Weight Bearing: Weight bearing as tolerated     Mobility  Bed Mobility Overal bed mobility: Modified Independent Bed Mobility: Supine to Sit     Supine to sit: Modified independent (Device/Increase time), HOB elevated     General bed mobility comments: Pt utilizing bed rails with HOB elevated to transition to sit L EOB    Transfers Overall transfer level: Needs assistance Equipment used: Rolling walker (2 wheels) Transfers: Sit to/from Stand, Bed to chair/wheelchair/BSC Sit to Stand: Min assist   Step pivot transfers: Min assist       General transfer comment: Pt correcting self for hand placement, minA to power up to stand. MinA for balance with step pivot to L from bed to recliner with RW support.    Ambulation/Gait Ambulation/Gait assistance: Min Chemical engineer (Feet): 2 Feet Assistive device: Rolling walker (2 wheels) Gait Pattern/deviations: Knee hyperextension - right, Knee hyperextension - left, Decreased dorsiflexion - right, Decreased dorsiflexion - left, Drifts right/left, Narrow base of support, Knees buckling Gait velocity: reduced Gait velocity interpretation: <1.31 ft/sec, indicative of household ambulator   General Gait Details: Pt taking slow, small pivotal steps from bed to recliner with RW support and minA. No buckling noted. Session focused on exercises and taking pt ouside for improved psychological health  instead of gait training today.   Stairs  Wheelchair Mobility    Modified Rankin (Stroke Patients Only)       Balance Overall balance assessment: Needs assistance Sitting-balance support: Feet supported, No upper extremity supported Sitting balance-Leahy Scale: Good Sitting balance - Comments: donned TLSO while seated on EOB   Standing balance support: Bilateral upper extremity supported, During functional activity Standing balance-Leahy Scale: Poor Standing balance comment: Reliant on RW to ambulate                            Cognition Arousal/Alertness: Awake/alert Behavior During Therapy: WFL for tasks assessed/performed Overall Cognitive Status: Within Functional Limits for tasks assessed                         Following Commands: Follows multi-step commands with increased time       General Comments: joking often during visit. Continues to requires cues for safety at times        Exercises General Exercises - Lower Extremity Ankle Circles/Pumps: AROM, Both, 20 reps, Seated Long Arc Quad: AROM, Both, 20 reps, Seated Hip ABduction/ADduction: AROM, Both, 20 reps, Seated Hip Flexion/Marching: AROM, Both, 20 reps, Seated    General Comments General comments (skin integrity, edema, etc.): Obtained verbal clearance and received orders from Dr. Maryfrances Bunnell 09/30/22 to take pt off the unit and outside; took pt outside to improve psychological well being after being hospitalized for >40 days, pt very appreciative      Pertinent Vitals/Pain Pain Assessment Pain Assessment: Faces Faces Pain Scale: Hurts little more Pain Location: back Pain Descriptors / Indicators: Discomfort, Grimacing, Guarding Pain Intervention(s): Limited activity within patient's tolerance, Premedicated before session, Monitored during session, Repositioned    Home Living                          Prior Function            PT Goals (current goals  can now be found in the care plan section) Acute Rehab PT Goals Patient Stated Goal: to go outside; to walk again PT Goal Formulation: With patient Time For Goal Achievement: 10/12/22 Potential to Achieve Goals: Good Progress towards PT goals: Progressing toward goals    Frequency    Min 5X/week      PT Plan Current plan remains appropriate    Co-evaluation              AM-PAC PT "6 Clicks" Mobility   Outcome Measure  Help needed turning from your back to your side while in a flat bed without using bedrails?: A Little Help needed moving from lying on your back to sitting on the side of a flat bed without using bedrails?: A Little Help needed moving to and from a bed to a chair (including a wheelchair)?: A Little Help needed standing up from a chair using your arms (e.g., wheelchair or bedside chair)?: A Little Help needed to walk in hospital room?: A Lot Help needed climbing 3-5 steps with a railing? : Total 6 Click Score: 15    End of Session Equipment Utilized During Treatment: Gait belt;Back brace Activity Tolerance: Patient tolerated treatment well Patient left: in chair;with call bell/phone within reach;with chair alarm set Nurse Communication: Mobility status;Other (comment) (got clearance to take pt outside) PT Visit Diagnosis: Difficulty in walking, not elsewhere classified (R26.2);Other abnormalities of gait and mobility (R26.89);Muscle weakness (generalized) (M62.81);Pain;Unsteadiness on feet (R26.81)     Time:  9528-4132 PT Time Calculation (min) (ACUTE ONLY): 36 min  Charges:  $Therapeutic Exercise: 8-22 mins $Therapeutic Activity: 8-22 mins                     Raymond Gurney, PT, DPT Acute Rehabilitation Services  Office: [phone]    Jewel Baize 09/30/2022, 3:22 PM

## 2022-09-30 NOTE — TOC Progression Note (Signed)
Transition of Care [facility]) - Progression Note    Patient Details  Name: David [name] MRN: [id number] Date of Birth: 07-31-1960  [facility]) CM/SW Contact  Baldemar Lenis, [place] Phone Number: 09/30/2022, 10:13 AM  Clinical Narrative:   CSW noting per chart review that patient is now off IV antibiotics and can look for SNF LOG at this time. CSW faxed out referral. CSW spoke with patient via phone to answer questions. Per patient, his wife has been unable to pay the cell phone bill, which is why CSW has been unable to reach her. CSW to follow.    Expected Discharge Plan: Skilled Nursing Facility Barriers to Discharge: Inadequate or no insurance, SNF Pending bed offer, SNF Pending payor source - LOG  Expected Discharge Plan and Services   Discharge Planning Services: CM Consult Post Acute Care Choice: Home Health Living arrangements for the past 2 months: Hotel/Motel                                       Social Determinants of Health (SDOH) Interventions SDOH Screenings   Food Insecurity: Patient Unable To Answer (08/23/2022)  Housing: Patient Unable To Answer (08/23/2022)  Transportation Needs: Patient Unable To Answer (08/23/2022)  Utilities: Patient Unable To Answer (08/23/2022)  Depression (PHQ2-9): Low Risk  (11/12/2021)  Stress: Stress Concern Present (09/26/2017)  Tobacco Use: Medium Risk (09/25/2022)    Readmission Risk Interventions    09/06/2021   12:07 PM  Readmission Risk Prevention Plan  Post Dischage Appt Complete  Medication Screening Complete  Transportation Screening Complete

## 2022-09-30 NOTE — Progress Notes (Signed)
  Progress Note   Patient: David [name] UJW:[id number] DOB: [birth year] DOA: 08/20/2022     38 DOS: the patient was seen and examined on 09/30/2022 at 9:12AM      Brief hospital course: David [name] is a 62 y.o. 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 source osteomyelitis of the foot (now s/p amputation) - Continue doxycycline "indefinitely" - Outpatient follow-up with infectious disease  - Continue fentanyl, oxycodone, duloxetine, Celebrex, acetaminophen    Essential hypertension BP controlled - Continue Coreg, furosemide, Entresto, clonidine  Uncontrolled type 2 diabetes with hyperglycemia and hypoglycemia, insulin long term use Glucoses controlled - Continue glargine - Continue sliding scale corrections - Continue duloxetine, Celebrex, Lyrica  Anxiety - Continue Buspar  Acute urinary retention - Continue Flomax and finasteride    Chronic systolic CHF (congestive heart failure) (HCC) EF 35-40% in 2020, now resolved to normal Appears euvolemic here - Continue Entresto, digoxin, Coreg, Lasix  Pleural effusion Stable, asymptomatic. - Repeat CXR in 4-6 weeks, mid July      PVD (peripheral vascular disease) (HCC) S/p angioplasty in April - Continue atorvastatin, apixaban   Pure hypercholesterolemia - Continue Lipitor  Persistent atrial fibrillation (HCC) Rate controlled - Continue Eliquis - Continue Coreg, digoxin          Subjective: Patient having increased pain in the setting of tapering his fentanyl and oxycodone in the last 24 hours, otherwise doing okay, no fevers.     Physical Exam: BP (!) 143/93 (BP Location: Left Arm)   Pulse 69   Temp 98.1 F (36.7 C) (Oral)    Resp 19   Ht 6' (1.829 m)   Wt 83.7 kg   SpO2 99%   BMI 25.03 kg/m   Adult male, lying in bed, no acute distress RRR, no murmurs, no peripheral edema Respiratory rate normal, lungs clear without rales or wheezes Attention normal, effort normal, face symmetric, speech fluent, upper extremity strength seems symmetric    Data Reviewed: Glucose normal       Disposition: Status is: Inpatient         Author: Alberteen Sam, MD 09/30/2022 3:37 PM  For on call review [email or web address].

## 2022-10-01 DIAGNOSIS — I5022 Chronic systolic (congestive) heart failure: Secondary | ICD-10-CM

## 2022-10-01 DIAGNOSIS — R338 Other retention of urine: Secondary | ICD-10-CM

## 2022-10-01 DIAGNOSIS — M4644 Discitis, unspecified, thoracic region: Secondary | ICD-10-CM | POA: Diagnosis not present

## 2022-10-01 DIAGNOSIS — I10 Essential (primary) hypertension: Secondary | ICD-10-CM | POA: Diagnosis not present

## 2022-10-01 DIAGNOSIS — G929 Unspecified toxic encephalopathy: Secondary | ICD-10-CM

## 2022-10-01 DIAGNOSIS — F419 Anxiety disorder, unspecified: Secondary | ICD-10-CM

## 2022-10-01 DIAGNOSIS — D62 Acute posthemorrhagic anemia: Secondary | ICD-10-CM | POA: Diagnosis not present

## 2022-10-01 DIAGNOSIS — E119 Type 2 diabetes mellitus without complications: Secondary | ICD-10-CM | POA: Diagnosis not present

## 2022-10-01 LAB — GLUCOSE, CAPILLARY: Glucose-Capillary: 172 mg/dL — ABNORMAL HIGH (ref 70–99)

## 2022-10-01 NOTE — Plan of Care (Signed)
  Problem: Education: Goal: Understanding of CV disease, CV risk reduction, and recovery process will improve Outcome: Progressing Goal: Individualized Educational Video(s) Outcome: Progressing   Problem: Activity: Goal: Ability to return to baseline activity level will improve Outcome: Progressing   Problem: Cardiovascular: Goal: Ability to achieve and maintain adequate cardiovascular perfusion will improve Outcome: Progressing Goal: Vascular access site(s) Level 0-1 will be maintained Outcome: Progressing   Problem: Health Behavior/Discharge Planning: Goal: Ability to safely manage health-related needs after discharge will improve Outcome: Progressing   Problem: Education: Goal: Knowledge of General Education information will improve Description: Including pain rating scale, medication(s)/side effects and non-pharmacologic comfort measures Outcome: Progressing   Problem: Health Behavior/Discharge Planning: Goal: Ability to manage health-related needs will improve Outcome: Progressing   Problem: Clinical Measurements: Goal: Ability to maintain clinical measurements within normal limits will improve Outcome: Progressing Goal: Will remain free from infection Outcome: Progressing Goal: Diagnostic test results will improve Outcome: Progressing Goal: Respiratory complications will improve Outcome: Progressing Goal: Cardiovascular complication will be avoided Outcome: Progressing   Problem: Activity: Goal: Risk for activity intolerance will decrease Outcome: Progressing   Problem: Nutrition: Goal: Adequate nutrition will be maintained Outcome: Progressing   Problem: Coping: Goal: Level of anxiety will decrease Outcome: Progressing   Problem: Elimination: Goal: Will not experience complications related to bowel motility Outcome: Progressing Goal: Will not experience complications related to urinary retention Outcome: Progressing   Problem: Pain Managment: Goal:  General experience of comfort will improve Outcome: Progressing   Problem: Safety: Goal: Ability to remain free from injury will improve Outcome: Progressing   Problem: Skin Integrity: Goal: Risk for impaired skin integrity will decrease Outcome: Progressing   Problem: Education: Goal: Ability to describe self-care measures that may prevent or decrease complications (Diabetes Survival Skills Education) will improve Outcome: Progressing Goal: Individualized Educational Video(s) Outcome: Progressing   Problem: Coping: Goal: Ability to adjust to condition or change in health will improve Outcome: Progressing   Problem: Fluid Volume: Goal: Ability to maintain a balanced intake and output will improve Outcome: Progressing   Problem: Health Behavior/Discharge Planning: Goal: Ability to identify and utilize available resources and services will improve Outcome: Progressing Goal: Ability to manage health-related needs will improve Outcome: Progressing   Problem: Metabolic: Goal: Ability to maintain appropriate glucose levels will improve Outcome: Progressing   Problem: Nutritional: Goal: Maintenance of adequate nutrition will improve Outcome: Progressing Goal: Progress toward achieving an optimal weight will improve Outcome: Progressing   Problem: Skin Integrity: Goal: Risk for impaired skin integrity will decrease Outcome: Progressing   Problem: Tissue Perfusion: Goal: Adequacy of tissue perfusion will improve Outcome: Progressing   Problem: Education: Goal: Ability to verbalize activity precautions or restrictions will improve Outcome: Progressing Goal: Knowledge of the prescribed therapeutic regimen will improve Outcome: Progressing Goal: Understanding of discharge needs will improve Outcome: Progressing   Problem: Activity: Goal: Ability to avoid complications of mobility impairment will improve Outcome: Progressing Goal: Ability to tolerate increased activity  will improve Outcome: Progressing Goal: Will remain free from falls Outcome: Progressing   Problem: Bowel/Gastric: Goal: Gastrointestinal status for postoperative course will improve Outcome: Progressing   

## 2022-10-01 NOTE — Progress Notes (Signed)
  Progress Note   Patient: David [name] RUE:[id number] DOB: 1960-09-14 DOA: 08/20/2022     39 DOS: the patient was seen and examined on 10/01/2022 at 10:34AM      Brief hospital course: David [name] is a 62 y.o. 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  Pain controlled.  Getting stronger.  No longer with perineal anesthesia.  No fever, worsening pain. No nursing concerns.     Physical Exam: BP (!) 152/96 (BP Location: Left Arm)   Pulse 91   Temp 98.4 F (36.9 C) (Oral)   Resp 18   Ht 6' (1.829 m)   Wt 83.7 kg   SpO2 97%   BMI 25.03 kg/m   Adult male, sitting up in bed, reading, no acute distress RRR, no murmurs, no peripheral edema Respiratory rate normal, lungs clear without rales or wheezes Interactive and appropriate    Data Reviewed: Glucose controlled      Disposition: Status is: Inpatient Patient is still unable to safely discharged home alone, we still await rehabilitation        Author: Alberteen Sam, MD 10/01/2022 2:26 PM  For on call review [email or web address].

## 2022-10-02 DIAGNOSIS — M4644 Discitis, unspecified, thoracic region: Secondary | ICD-10-CM | POA: Diagnosis not present

## 2022-10-02 DIAGNOSIS — D62 Acute posthemorrhagic anemia: Secondary | ICD-10-CM | POA: Diagnosis not present

## 2022-10-02 DIAGNOSIS — E119 Type 2 diabetes mellitus without complications: Secondary | ICD-10-CM | POA: Diagnosis not present

## 2022-10-02 DIAGNOSIS — I10 Essential (primary) hypertension: Secondary | ICD-10-CM | POA: Diagnosis not present

## 2022-10-02 LAB — GLUCOSE, CAPILLARY
Glucose-Capillary: 176 mg/dL — ABNORMAL HIGH (ref 70–99)
Glucose-Capillary: 187 mg/dL — ABNORMAL HIGH (ref 70–99)
Glucose-Capillary: 211 mg/dL — ABNORMAL HIGH (ref 70–99)

## 2022-10-02 NOTE — Progress Notes (Signed)
  Progress Note   Patient: David [name] ZOX:[id number] DOB: 01/24/1961 DOA: 08/20/2022     40 DOS: the patient was seen and examined on 10/02/2022 at 11:00AM      Brief hospital course: David [name] is a 62 y.o. 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  On PCA earlier in hospital stay.  Now weaned to fentanyl patch and oxy 5-10.  Desires to wean further - Continue fentanyl, decrease to 25 mcg/hr patch - Continue oxycodone, taper as able - Continue duloxetine - Continue acetaminophen and Cebebrex      Essential hypertension BP controlled - Continue Coreg, furosemide, Entresto, clonidine  Uncontrolled type 2 diabetes with hyperglycemia and hypoglycemia, insulin long term use Glucoses controlled - Continue glargine - Continue sliding scale corrections - Continue duloxetine, Celebrex, Lyrica  Anxiety - Continue Buspar  Acute urinary retention Acute urinary retention here, not relieved with positioning.  No constipation.  Foley replaced 6/25 - Continue Flomax and finasteride - Repeat voiding trial around Jul 1  Chronic systolic CHF (congestive heart failure) (HCC) EF 35-40% in 2020, now resolved to normal Appears euvolemic here - Continue Entresto, digoxin, Coreg, Lasix    Persistent atrial fibrillation (HCC) Rate controlled - Continue Eliquis - Continue Coreg, digoxin          Subjective: Pain controlled, [name] this weekend, no fever, no confusion.     Physical Exam: BP 139/82 (BP Location: Left Arm)    Pulse (!) 56   Temp 98.3 F (36.8 C) (Oral)   Resp 19   Ht 6' (1.829 m)   Wt 83.7 kg   SpO2 100%   BMI 25.03 kg/m   Adult male, sitting up in bed, interactive and appropriate   Data Reviewed: Glucose controlled     Disposition: Status is: Inpatient         Author: Alberteen Sam, MD 10/02/2022 5:20 PM  For on call review [email or web address].

## 2022-10-03 DIAGNOSIS — F32A Depression, unspecified: Secondary | ICD-10-CM | POA: Diagnosis present

## 2022-10-03 DIAGNOSIS — I10 Essential (primary) hypertension: Secondary | ICD-10-CM | POA: Diagnosis not present

## 2022-10-03 DIAGNOSIS — I959 Hypotension, unspecified: Secondary | ICD-10-CM | POA: Diagnosis present

## 2022-10-03 DIAGNOSIS — E119 Type 2 diabetes mellitus without complications: Secondary | ICD-10-CM | POA: Diagnosis not present

## 2022-10-03 DIAGNOSIS — Z66 Do not resuscitate: Secondary | ICD-10-CM | POA: Diagnosis not present

## 2022-10-03 DIAGNOSIS — E11649 Type 2 diabetes mellitus with hypoglycemia without coma: Secondary | ICD-10-CM | POA: Diagnosis present

## 2022-10-03 DIAGNOSIS — L0390 Cellulitis, unspecified: Secondary | ICD-10-CM | POA: Diagnosis present

## 2022-10-03 DIAGNOSIS — E1169 Type 2 diabetes mellitus with other specified complication: Secondary | ICD-10-CM | POA: Diagnosis present

## 2022-10-03 DIAGNOSIS — I429 Cardiomyopathy, unspecified: Secondary | ICD-10-CM | POA: Diagnosis present

## 2022-10-03 DIAGNOSIS — I739 Peripheral vascular disease, unspecified: Secondary | ICD-10-CM | POA: Diagnosis not present

## 2022-10-03 DIAGNOSIS — E162 Hypoglycemia, unspecified: Secondary | ICD-10-CM | POA: Diagnosis present

## 2022-10-03 DIAGNOSIS — E876 Hypokalemia: Secondary | ICD-10-CM | POA: Diagnosis not present

## 2022-10-03 DIAGNOSIS — M4854XA Collapsed vertebra, not elsewhere classified, thoracic region, initial encounter for fracture: Secondary | ICD-10-CM | POA: Diagnosis present

## 2022-10-03 DIAGNOSIS — G929 Unspecified toxic encephalopathy: Secondary | ICD-10-CM | POA: Diagnosis not present

## 2022-10-03 DIAGNOSIS — G928 Other toxic encephalopathy: Secondary | ICD-10-CM | POA: Diagnosis not present

## 2022-10-03 DIAGNOSIS — E871 Hypo-osmolality and hyponatremia: Secondary | ICD-10-CM | POA: Diagnosis present

## 2022-10-03 DIAGNOSIS — Y712 Prosthetic and other implants, materials and accessory cardiovascular devices associated with adverse incidents: Secondary | ICD-10-CM | POA: Diagnosis not present

## 2022-10-03 DIAGNOSIS — I5022 Chronic systolic (congestive) heart failure: Secondary | ICD-10-CM | POA: Diagnosis present

## 2022-10-03 DIAGNOSIS — I110 Hypertensive heart disease with heart failure: Secondary | ICD-10-CM | POA: Diagnosis present

## 2022-10-03 DIAGNOSIS — R7881 Bacteremia: Secondary | ICD-10-CM | POA: Diagnosis present

## 2022-10-03 DIAGNOSIS — F419 Anxiety disorder, unspecified: Secondary | ICD-10-CM | POA: Diagnosis not present

## 2022-10-03 DIAGNOSIS — M4645 Discitis, unspecified, thoracolumbar region: Secondary | ICD-10-CM | POA: Diagnosis not present

## 2022-10-03 DIAGNOSIS — I700 Atherosclerosis of aorta: Secondary | ICD-10-CM | POA: Diagnosis present

## 2022-10-03 DIAGNOSIS — M4644 Discitis, unspecified, thoracic region: Secondary | ICD-10-CM | POA: Diagnosis not present

## 2022-10-03 DIAGNOSIS — M4640 Discitis, unspecified, site unspecified: Secondary | ICD-10-CM | POA: Diagnosis not present

## 2022-10-03 DIAGNOSIS — F05 Delirium due to known physiological condition: Secondary | ICD-10-CM | POA: Diagnosis not present

## 2022-10-03 DIAGNOSIS — D509 Iron deficiency anemia, unspecified: Secondary | ICD-10-CM | POA: Diagnosis present

## 2022-10-03 DIAGNOSIS — M868X7 Other osteomyelitis, ankle and foot: Secondary | ICD-10-CM | POA: Diagnosis present

## 2022-10-03 DIAGNOSIS — T82514A Breakdown (mechanical) of infusion catheter, initial encounter: Secondary | ICD-10-CM | POA: Diagnosis not present

## 2022-10-03 DIAGNOSIS — D62 Acute posthemorrhagic anemia: Secondary | ICD-10-CM | POA: Diagnosis not present

## 2022-10-03 DIAGNOSIS — T8130XA Disruption of wound, unspecified, initial encounter: Secondary | ICD-10-CM | POA: Diagnosis not present

## 2022-10-03 DIAGNOSIS — M4624 Osteomyelitis of vertebra, thoracic region: Secondary | ICD-10-CM | POA: Diagnosis present

## 2022-10-03 DIAGNOSIS — I4821 Permanent atrial fibrillation: Secondary | ICD-10-CM | POA: Diagnosis present

## 2022-10-03 DIAGNOSIS — Z515 Encounter for palliative care: Secondary | ICD-10-CM | POA: Diagnosis not present

## 2022-10-03 DIAGNOSIS — G40909 Epilepsy, unspecified, not intractable, without status epilepticus: Secondary | ICD-10-CM | POA: Diagnosis present

## 2022-10-03 LAB — CBC WITH DIFFERENTIAL/PLATELET
Abs Immature Granulocytes: 0.02 10*3/uL (ref 0.00–0.07)
Basophils Absolute: 0.1 10*3/uL (ref 0.0–0.1)
Basophils Relative: 2 %
Eosinophils Absolute: 0.4 10*3/uL (ref 0.0–0.5)
Eosinophils Relative: 6 %
HCT: 37.2 % — ABNORMAL LOW (ref 39.0–52.0)
Hemoglobin: 12.1 g/dL — ABNORMAL LOW (ref 13.0–17.0)
Immature Granulocytes: 0 %
Lymphocytes Relative: 15 %
Lymphs Abs: 1.1 10*3/uL (ref 0.7–4.0)
MCH: 29.6 pg (ref 26.0–34.0)
MCHC: 32.5 g/dL (ref 30.0–36.0)
MCV: 91 fL (ref 80.0–100.0)
Monocytes Absolute: 1 10*3/uL (ref 0.1–1.0)
Monocytes Relative: 14 %
Neutro Abs: 4.5 10*3/uL (ref 1.7–7.7)
Neutrophils Relative %: 63 %
Platelets: 291 10*3/uL (ref 150–400)
RBC: 4.09 MIL/uL — ABNORMAL LOW (ref 4.22–5.81)
RDW: 18.8 % — ABNORMAL HIGH (ref 11.5–15.5)
WBC: 7.2 10*3/uL (ref 4.0–10.5)
nRBC: 0 % (ref 0.0–0.2)

## 2022-10-03 LAB — GLUCOSE, CAPILLARY
Glucose-Capillary: 125 mg/dL — ABNORMAL HIGH (ref 70–99)
Glucose-Capillary: 158 mg/dL — ABNORMAL HIGH (ref 70–99)
Glucose-Capillary: 145 mg/dL — ABNORMAL HIGH (ref 70–99)
Glucose-Capillary: 144 mg/dL — ABNORMAL HIGH (ref 70–99)
Glucose-Capillary: 143 mg/dL — ABNORMAL HIGH (ref 70–99)
Glucose-Capillary: 184 mg/dL — ABNORMAL HIGH (ref 70–99)
Glucose-Capillary: 202 mg/dL — ABNORMAL HIGH (ref 70–99)
Glucose-Capillary: 187 mg/dL — ABNORMAL HIGH (ref 70–99)

## 2022-10-03 LAB — SEDIMENTATION RATE: Sed Rate: 27 mm/hr — ABNORMAL HIGH (ref 0–16)

## 2022-10-03 LAB — C-REACTIVE PROTEIN: CRP: 0.7 mg/dL (ref <1.0)

## 2022-10-03 NOTE — Progress Notes (Signed)
Consulted with MD about foley removal. Pt prefers to wait until tomorrow to take foley out and do void trial. It is the pts birthday today and he said he did not want to have to do that today.

## 2022-10-03 NOTE — Progress Notes (Signed)
Occupational Therapy Treatment Patient Details Name: David [name] MRN: [id number] DOB: 1961/01/10 Today's Date: 10/03/2022   History of present illness Pt is a 62 y.o. male who presented 08/20/22  with hypoglycemia. 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 5/28 surgical repair: T5-11 posterior/posterolateral arthrodesis  & Transpedicular decompression with partial corpectomy, left T8 & T8-9 laminectomy, bilateral facetectomy. 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 Pt demonstrating good progress toward therapy goals. Pt presents this session in chair post recent PT session. Pt agreeable to sinkside grooming while alternating sitting/standing. Pt requiring less assistance for functional transfers and balance this session. Pt tolerated standing sinkside for approx 5 minutes before requiring seated rest break. Pt reminded to reapply back brace while sitting up in chair, pt verbalized understanding and able to don brace with setup A. Pt would benefit from continued acute OT services to maximize functional independence and facilitate transition to intensive inpatient follow up therapy, >3 hours/day after discharge.    Recommendations for follow up therapy are one component of a multi-disciplinary discharge planning process, led by the attending physician.  Recommendations may be updated based on patient status, additional functional criteria and insurance authorization.    Assistance Recommended at Discharge Frequent or constant Supervision/Assistance  Patient can return home with the following  Two people to help with  walking and/or transfers;Two people to help with bathing/dressing/bathroom   Equipment Recommendations  BSC/3in1;Wheelchair (measurements OT);Wheelchair cushion (measurements OT)    Recommendations for Other Services      Precautions / Restrictions Precautions Precautions: Fall;Back Precaution Booklet Issued: No Required Braces or Orthoses: Spinal Brace Spinal Brace: Thoracolumbosacral orthotic;Applied in sitting position Other Brace: for use when ambulating. Restrictions Weight Bearing Restrictions: Yes RLE Weight Bearing: Weight bearing as tolerated       Mobility Bed Mobility               General bed mobility comments: Not assessed, pt in chair upon arrival    Transfers Overall transfer level: Needs assistance Equipment used: 1 person hand held assist Transfers: Sit to/from Stand Sit to Stand: Min guard           General transfer comment: STS transfer from chair with min guard A     Balance Overall balance assessment: Needs assistance Sitting-balance support: Feet supported, No upper extremity supported Sitting balance-Leahy Scale: Good     Standing balance support: Bilateral upper extremity supported, During functional activity Standing balance-Leahy Scale: Poor Standing balance comment: BUE support during STS transfer, able to offload BUEs during grooming                           ADL either performed or assessed with clinical judgement   ADL Overall ADL's : Needs assistance/impaired     Grooming: Wash/dry hands;Oral care;Wash/dry face;Set up;Min guard;Sitting;Standing Grooming Details (indicate cue type and reason): pt shaved face with electric razor while alternating sitting/standing sinkside with min guard A for stability. Washed face, brushed teeth seated sinkside with setup A.         Upper Body Dressing : Set up;Sitting Upper Body Dressing  Details (indicate cue type and reason): donned TLSO brace seated with setup A      Toilet Transfer: Min Pension scheme manager Details (indicate cue type and reason): simulated         Functional mobility during ADLs: Min guard General ADL Comments: Increased time, reminder to don brace while seated in chair    Extremity/Trunk Assessment Upper Extremity Assessment Upper Extremity Assessment: Generalized weakness   Lower Extremity Assessment Lower Extremity Assessment: Defer to PT evaluation        Vision   Vision Assessment?: No apparent visual deficits   Perception Perception Perception: Not tested   Praxis Praxis Praxis: Not tested    Cognition Arousal/Alertness: Awake/alert Behavior During Therapy: WFL for tasks assessed/performed Overall Cognitive Status: Within Functional Limits for tasks assessed                                 General Comments: Pleasant and cooperative        Exercises      Shoulder Instructions       General Comments VSS on RA    Pertinent Vitals/ Pain       Pain Assessment Pain Assessment: No/denies pain Pain Intervention(s): Monitored during session  Home Living                                          Prior Functioning/Environment              Frequency  Min 2X/week        Progress Toward Goals  OT Goals(current goals can now be found in the care plan section)  Progress towards OT goals: Progressing toward goals  Acute Rehab OT Goals Patient Stated Goal: to shave face OT Goal Formulation: With patient Time For Goal Achievement: 10/10/22 Potential to Achieve Goals: Good ADL Goals Pt Will Perform Lower Body Bathing: with modified independence;sit to/from stand;with adaptive equipment Pt Will Perform Lower Body Dressing: with modified independence;sit to/from stand;with adaptive equipment Pt Will Transfer to Toilet: with modified independence;ambulating;regular height toilet Additional ADL Goal #1: Pt to increase standing tolerance to 8-10 min during  functional tasks  Plan Discharge plan remains appropriate;Frequency remains appropriate    Co-evaluation                 AM-PAC OT "6 Clicks" Daily Activity     Outcome Measure   Help from another person eating meals?: None Help from another person taking care of personal grooming?: A Little Help from another person toileting, which includes using toliet, bedpan, or urinal?: A Little Help from another person bathing (including washing, rinsing, drying)?: A Little Help from another person to put on and taking off regular upper body clothing?: A Little Help from another person to put on and taking off regular lower body clothing?: A Little 6 Click Score: 19    End of Session Equipment Utilized During Treatment: Other (comment) (none)  OT Visit Diagnosis: Unsteadiness on feet (R26.81);Other abnormalities of gait and mobility (R26.89);Muscle weakness (generalized) (M62.81);Pain   Activity Tolerance Patient tolerated treatment well   Patient Left in chair;with call bell/phone within reach;with chair alarm set   Nurse Communication Mobility status        Time: 1610-9604 OT Time Calculation (min): 22 min  Charges: OT General Charges $OT Visit: 1 Visit OT Treatments $Self  Care/Home Management : 8-22 mins Sherley Bounds, OTS Acute Rehabilitation Services Office [phone] Secure Chat Communication Preferred   Sherley Bounds 10/03/2022, 4:50 PM

## 2022-10-03 NOTE — Progress Notes (Signed)
Physical Therapy Treatment Patient Details Name: David [name] MRN: [id number] DOB: [birth year] Today's Date: 10/03/2022   History of Present Illness Pt is a 62 y.o. male who presented 08/20/22  with hypoglycemia. 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 5/28 surgical repair: T5-11 posterior/posterolateral arthrodesis  & Transpedicular decompression with partial corpectomy, left T8 & T8-9 laminectomy, bilateral facetectomy. 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 Session focused on continued gait tolerance and gait over uneven ground outside. Pt able to perform x2 gait bouts room<>hall and x2 bouts outside with up to min A to steady and RW for support. Pt with continued bil knee hyperextension and foot slap, needing cues to attempt to correct as well as cues for increased bil foot clearance over concrete outdoors. Pt remains motivated to progress and continues to benefit from skilled PT services to progress toward functional mobility goals.      Assistance Recommended at Discharge Frequent or constant Supervision/Assistance  If plan is discharge home, recommend the following:  Can travel by private vehicle    Assist for transportation;Two people to help with walking and/or transfers;A lot of help with bathing/dressing/bathroom;Assistance with cooking/housework;Direct supervision/assist for financial management;Direct supervision/assist for medications management;Help with stairs or ramp for entrance   No  Equipment Recommendations  Rolling walker (2 wheels);Wheelchair cushion  (measurements PT);Wheelchair (measurements PT);BSC/3in1    Recommendations for Other Services       Precautions / Restrictions Precautions Precautions: Fall;Back Precaution Booklet Issued: No Required Braces or Orthoses: Spinal Brace Spinal Brace: Thoracolumbosacral orthotic;Applied in sitting position Other Brace: for use when ambulating. Restrictions Weight Bearing Restrictions: No RLE Weight Bearing: Weight bearing as tolerated     Mobility  Bed Mobility Overal bed mobility: Modified Independent       Supine to sit: Modified independent (Device/Increase time), HOB elevated     General bed mobility comments: Pt utilizing bed rails with HOB elevated to transition to sit L EOB    Transfers Overall transfer level: Needs assistance Equipment used: Rolling walker (2 wheels) Transfers: Sit to/from Stand, Bed to chair/wheelchair/BSC Sit to Stand: Min assist, Min guard   Step pivot transfers: Min guard       General transfer comment: Pt correcting self for hand placement, minA to power up to stand from bench and transport wheelchair, min guard from EOB    Ambulation/Gait Ambulation/Gait assistance: Min assist Gait Distance (Feet): 20 Feet (+24' +30' +20') Assistive device: Rolling walker (2 wheels) Gait Pattern/deviations: Knee hyperextension - right, Knee hyperextension - left, Decreased dorsiflexion - right, Decreased dorsiflexion - left, Drifts right/left, Narrow base of support, Knees buckling Gait velocity: reduced     General Gait Details: x2 bouts from room<>transport chair in hall and x2 bouts outside over concrete, cues for increased foot clearance over concrete and for attention to nkee hyperextension, no buckling noted   Stairs             Wheelchair Mobility     Tilt Bed    Modified Rankin (Stroke Patients Only)       Balance Overall balance assessment: Needs assistance Sitting-balance support: Feet  supported, No upper extremity  supported Sitting balance-Leahy Scale: Good Sitting balance - Comments: donned TLSO while seated on EOB   Standing balance support: Bilateral upper extremity supported, During functional activity Standing balance-Leahy Scale: Poor Standing balance comment: Reliant on RW to ambulate                            Cognition Arousal/Alertness: Awake/alert Behavior During Therapy: WFL for tasks assessed/performed Overall Cognitive Status: Within Functional Limits for tasks assessed                                 General Comments: joking often during visit. Continues to requires cues for safety at times        Exercises      General Comments        Pertinent Vitals/Pain Pain Assessment Pain Assessment: No/denies pain Pain Intervention(s): Monitored during session    Home Living                          Prior Function            PT Goals (current goals can now be found in the care plan section) Acute Rehab PT Goals Patient Stated Goal: "to go outside for my birthday" PT Goal Formulation: With patient Time For Goal Achievement: 10/12/22 Progress towards PT goals: Progressing toward goals    Frequency    Min 5X/week      PT Plan Current plan remains appropriate    Co-evaluation              AM-PAC PT "6 Clicks" Mobility   Outcome Measure  Help needed turning from your back to your side while in a flat bed without using bedrails?: A Little Help needed moving from lying on your back to sitting on the side of a flat bed without using bedrails?: A Little Help needed moving to and from a bed to a chair (including a wheelchair)?: A Little Help needed standing up from a chair using your arms (e.g., wheelchair or bedside chair)?: A Little Help needed to walk in hospital room?: A Lot Help needed climbing 3-5 steps with a railing? : Total 6 Click Score: 15    End of Session Equipment Utilized During Treatment: Back  brace Activity Tolerance: Patient tolerated treatment well Patient left: in chair;with call bell/phone within reach;with chair alarm set;Other (comment) (with OT and SOT present) Nurse Communication: Mobility status;Other (comment) (got clearance to take pt outside) PT Visit Diagnosis: Difficulty in walking, not elsewhere classified (R26.2);Other abnormalities of gait and mobility (R26.89);Muscle weakness (generalized) (M62.81);Pain;Unsteadiness on feet (R26.81) Pain - Right/Left: Left Pain - part of body: Leg     Time: 5409-8119 PT Time Calculation (min) (ACUTE ONLY): 54 min  Charges:    $Gait Training: 23-37 mins $Therapeutic Activity: 8-22 mins PT General Charges $$ ACUTE PT VISIT: 1 Visit                     Edmar Blankenburg R. PTA Acute Rehabilitation Services Office: [phone]   Catalina Antigua 10/03/2022, 4:20 PM

## 2022-10-03 NOTE — Progress Notes (Signed)
  Progress Note   Patient: David [name] ZOX:[id number] DOB: April 25, 1960 DOA: 08/20/2022     41 DOS: the patient was seen and examined on 10/03/2022        Brief hospital course: David [name] is a 62 y.o. 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 source osteomyelitis of the foot (now s/p amputation) - Continue doxycycline "indefinitely"        Essential hypertension BP controlled - Continue Coreg, furosemide, Entresto, clonidine  Uncontrolled type 2 diabetes with hyperglycemia and hypoglycemia, insulin long term use Glucoses controlled - Continue glargine - Continue sliding scale corrections - Continue duloxetine, Celebrex, Lyrica   Chronic systolic CHF (congestive heart failure) (HCC) EF 35-40% in 2020, now resolved to normal Appears euvolemic here - Continue Entresto, digoxin, Coreg, Lasix   PVD (peripheral vascular disease) (HCC) S/p angioplasty in April - Continue atorvastatin, apixaban  Persistent atrial fibrillation (HCC) Rate controlled - Continue Eliquis - Continue Coreg, digoxin          Subjective: No acute change today, feeling well, back pain is controllable, no fever or confusion.     Physical Exam: BP 129/74 (BP Location: Left Arm)   Pulse 77   Temp 98.6 F (37 C) (Oral)   Resp 18   Ht 6' (1.829 m)   Wt 80.7 kg   SpO2 99%   BMI 24.13 kg/m   Adult male, lying in bed, interactive RRR, no murmurs, no peripheral edema Respiratory rate normal, lungs clear without rales or wheezes   Data Reviewed: CBC unremarkable CRP and sed rate normal     Disposition: Status is: Inpatient         Author: Alberteen Sam, MD 10/03/2022 2:12 PM  For on call  review [email or web address].

## 2022-10-04 DIAGNOSIS — E162 Hypoglycemia, unspecified: Secondary | ICD-10-CM | POA: Diagnosis not present

## 2022-10-04 LAB — GLUCOSE, CAPILLARY
Glucose-Capillary: 121 mg/dL — ABNORMAL HIGH (ref 70–99)
Glucose-Capillary: 141 mg/dL — ABNORMAL HIGH (ref 70–99)
Glucose-Capillary: 175 mg/dL — ABNORMAL HIGH (ref 70–99)
Glucose-Capillary: 186 mg/dL — ABNORMAL HIGH (ref 70–99)

## 2022-10-04 NOTE — Plan of Care (Signed)
  Problem: Education: Goal: Understanding of CV disease, CV risk reduction, and recovery process will improve Outcome: Progressing Goal: Individualized Educational Video(s) Outcome: Progressing   

## 2022-10-04 NOTE — Progress Notes (Signed)
Physical Therapy Treatment Patient Details Name: David [name] MRN: [id number] DOB: 07/09/1960 Today's Date: 10/04/2022   History of Present Illness Pt is a 62 y.o. male who presented 08/20/22  with hypoglycemia. 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 5/28 surgical repair: T5-11 posterior/posterolateral arthrodesis  & Transpedicular decompression with partial corpectomy, left T8 & T8-9 laminectomy, bilateral facetectomy. 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 Pt ambulated 47' with RW and min A with chair behind for seated rest break. 2 bouts. Vc's for widening BOS, continues to need assist with R knee control, esp when fatigued. Pt with B foot inversion in stance, requested shoes be brought up for increased ankle stability. Pt performed seated LE there ex after ambulation. Patient will benefit from intensive inpatient follow up therapy, >3 hours/day. PT will continue to follow.       Assistance Recommended at Discharge Frequent or constant Supervision/Assistance  If plan is discharge home, recommend the following:  Can travel by private vehicle    Assist for transportation;Two people to help with walking and/or transfers;A lot of help with bathing/dressing/bathroom;Assistance with cooking/housework;Direct supervision/assist for financial management;Direct supervision/assist for medications management;Help with stairs or ramp for entrance   No  Equipment Recommendations  Rolling walker (2 wheels);Wheelchair cushion  (measurements PT);Wheelchair (measurements PT);BSC/3in1    Recommendations for Other Services Rehab consult     Precautions / Restrictions Precautions Precautions: Fall;Back Precaution Booklet Issued: No Required Braces or Orthoses: Spinal Brace Spinal Brace: Thoracolumbosacral orthotic;Applied in sitting position Other Brace: for use when ambulating. Restrictions Weight Bearing Restrictions: Yes RLE Weight Bearing: Weight bearing as tolerated RLE Partial Weight Bearing Percentage or Pounds: heel weight bearing Other Position/Activity Restrictions: with [place] shoe. Has not been using since amp was in 4/24     Mobility  Bed Mobility Overal bed mobility: Modified Independent Bed Mobility: Supine to Sit     Supine to sit: Modified independent (Device/Increase time), HOB elevated          Transfers Overall transfer level: Needs assistance Equipment used: Rolling walker (2 wheels) Transfers: Sit to/from Stand Sit to Stand: Min guard           General transfer comment: min guard from bed and recliner    Ambulation/Gait Ambulation/Gait assistance: Min assist Gait Distance (Feet): 80 Feet (2x) Assistive device: Rolling walker (2 wheels) Gait Pattern/deviations: Knee hyperextension - right, Knee hyperextension - left, Decreased dorsiflexion - right, Decreased dorsiflexion - left, Drifts right/left, Narrow base of support, Knees buckling Gait velocity: reduced Gait velocity interpretation: <1.31 ft/sec, indicative of household ambulator   General Gait Details: decreased knee buckling compared to last time this therapist saw pt 2 weeks ago, also improved LE foot placement. Continues to have narrow BOS and begins to have R knee hyperext with fatigue. Requested pt have his wife bring shoes to hospital for increased ankle stability   Stairs             Wheelchair Mobility     Tilt Bed    Modified Rankin (Stroke Patients Only)  Balance Overall balance  assessment: Needs assistance Sitting-balance support: Feet supported, No upper extremity supported Sitting balance-Leahy Scale: Good Sitting balance - Comments: donned TLSO while seated on EOB   Standing balance support: Bilateral upper extremity supported, During functional activity Standing balance-Leahy Scale: Poor Standing balance comment: heavily reliant on UE support in standing                            Cognition Arousal/Alertness: Awake/alert Behavior During Therapy: WFL for tasks assessed/performed Overall Cognitive Status: Within Functional Limits for tasks assessed                                 General Comments: mildly groggy from pain meds, improved as he mobilized        Exercises General Exercises - Lower Extremity Long Arc Quad: AROM, Both, Seated, 10 reps, Other (comment) (manual resistance given for ext and flex) Toe Raises: AROM, Both, 20 reps, Seated (alternating) Heel Raises: AROM, Both, 10 reps, Seated (alternating) Other Exercises Other Exercises: timed seated fast marching, tolerated for 20 secs Other Exercises: sitting flutter kick with LE's, 20 secs, then 30 secs    General Comments General comments (skin integrity, edema, etc.): VSS.      Pertinent Vitals/Pain Pain Assessment Pain Assessment: Faces Faces Pain Scale: Hurts little more Pain Location: back Pain Descriptors / Indicators: Discomfort Pain Intervention(s): Limited activity within patient's tolerance, Monitored during session, Premedicated before session    Home Living                          Prior Function            PT Goals (current goals can now be found in the care plan section) Acute Rehab PT Goals Patient Stated Goal: return home and drum again PT Goal Formulation: With patient Time For Goal Achievement: 10/12/22 Potential to Achieve Goals: Good Progress towards PT goals: Progressing toward goals    Frequency    Min  5X/week      PT Plan Current plan remains appropriate    Co-evaluation              AM-PAC PT "6 Clicks" Mobility   Outcome Measure  Help needed turning from your back to your side while in a flat bed without using bedrails?: A Little Help needed moving from lying on your back to sitting on the side of a flat bed without using bedrails?: A Little Help needed moving to and from a bed to a chair (including a wheelchair)?: A Little Help needed standing up from a chair using your arms (e.g., wheelchair or bedside chair)?: A Little Help needed to walk in hospital room?: A Lot Help needed climbing 3-5 steps with a railing? : Total 6 Click Score: 15    End of Session Equipment Utilized During Treatment: Back brace Activity Tolerance: Patient tolerated treatment well Patient left: in chair;with call bell/phone within reach Nurse Communication: Mobility status PT Visit Diagnosis: Difficulty in walking, not elsewhere classified (R26.2);Other abnormalities of gait and mobility (R26.89);Muscle weakness (generalized) (M62.81);Pain;Unsteadiness on feet (R26.81) Pain - Right/Left: Left Pain - part of body: Leg     Time: 1610-9604 PT Time Calculation (min) (ACUTE ONLY): 46 min  Charges:    $Gait Training: 23-37 mins $Therapeutic Exercise: 8-22 mins PT General Charges $$ ACUTE PT VISIT: 1 Visit  Lyanne Co, PT  Acute Rehab Services Secure chat preferred Office [phone]    Elyse Hsu 10/04/2022, 4:39 PM

## 2022-10-04 NOTE — TOC Progression Note (Signed)
Transition of Care [facility]) - Progression Note    Patient Details  Name: David [name] MRN: [id number] Date of Birth: [birth year]  Transition of Care [facility]) CM/SW Contact  Baldemar Lenis, [place] Phone Number: 10/04/2022, 2:53 PM  Clinical Narrative:   CSW has received potential bed offers for patient, pending Disability and Medicaid have been started. CSW reached out to [employer] to confirm that both had been started. [employer] initiated emergency medicaid, which patient has received, but did not pursue disability as it did not appear that the patient would be disabled for 12 months. CSW explained that patient will need SNF placement and will be unable to LOG without those pending. [facility] is sending patient information to [facility] to follow since patient will need placement. CSW to follow.    Expected Discharge Plan: Skilled Nursing Facility Barriers to Discharge: Inadequate or no insurance, SNF Pending bed offer, SNF Pending payor source - LOG  Expected Discharge Plan and Services   Discharge Planning Services: CM Consult Post Acute Care Choice: Home Health Living arrangements for the past 2 months: Hotel/Motel                                       Social Determinants of Health (SDOH) Interventions SDOH Screenings   Food Insecurity: Patient Unable To Answer (08/23/2022)  Housing: Patient Unable To Answer (08/23/2022)  Transportation Needs: Patient Unable To Answer (08/23/2022)  Utilities: Patient Unable To Answer (08/23/2022)  Depression (PHQ2-9): Low Risk  (11/12/2021)  Stress: Stress Concern Present (09/26/2017)  Tobacco Use: Medium Risk (09/25/2022)    Readmission Risk Interventions    09/06/2021   12:07 PM  Readmission Risk Prevention Plan  Post Dischage Appt Complete  Medication Screening Complete  Transportation Screening Complete

## 2022-10-04 NOTE — Progress Notes (Signed)
Progress Note   Patient: Devun Engberg ZOX:[id number] DOB: 10/22/1960 DOA: 08/20/2022     42 DOS: the patient was seen and examined on 10/04/2022 at 8:42AM      Brief hospital course: Mr. Alzate is a 62 y.o. 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  Had been on PCA at one point, now improving steadily.  Patient desires to wean further: - Taper off fentanyl patch today - Continue oxycodone, taper as able - Continue duloxetine - Continue acetaminophen and Celebrex     Chronic systolic CHF (congestive heart failure) (HCC) EF 35-40% in 2020, now resolved to  normal Appears euvolemic here - Continue Entresto, digoxin, Coreg, Lasix  Essential hypertension BP controlled - Continue Coreg, furosemide, Entresto, clonidine  Acute urinary retention Acute urinary retention here.  Failed a voiding trial one week ago, foley replaced 6/25 - Repeat voiding trial now - Continue Flomax and finasteride  Persistent atrial fibrillation (HCC) Rate controlled - Continue Eliquis - Continue Coreg, digoxin  Uncontrolled type 2 diabetes with hyperglycemia and hypoglycemia, insulin long term use Glucoses controlled - Continue glargine - Continue sliding scale corrections - Continue duloxetine, Celebrex, Lyrica  Anxiety - Continue Buspar  Pleural effusion Stable, asymptomatic. - Repeat CXR in 4-6 weeks, mid July  Acute postoperative anemia due to expected blood loss Hgb trending up to 10s now.  Likely some iron deficiency contributing. - Oral iron  Acute toxic encephalopathy At baseline has no cognitive impairment.  Developed encephalopathy (poorly responsive, confused responses) here due to high doses of opiates.  Resolved now.  Hypokalemia Resolved  AKI (acute kidney injury), ruled out  PVD (peripheral vascular disease) (HCC) S/p angioplasty in April - Continue atorvastatin, apixaban  Hypomagnesemia Resolved  Hyponatremia Mild, asymptomatic  Pure hypercholesterolemia - Continue Lipitor           Subjective: Feeling well.  Pain controlled.  Open take the Foley out today.  No fever.  No nursing concerns.     Physical Exam: BP 131/78 (BP Location: Left Arm)   Pulse 98   Temp 98.2 F (36.8 C) (Oral)   Resp 14   Ht 6' (1.829 m)   Wt 80.7 kg   SpO2 100%   BMI 24.13 kg/m   Adult male, sitting up in bed, interactive and appropriate RRR, no murmurs, no peripheral edema Respiratory rate  normal, lungs clear without rales or wheezes Abdomen soft with tenderness palpation or guarding   Data Reviewed: Glucose is  normal      Disposition: Status is: Inpatient The patient was admitted with disseminated staph infection, vertebral osteomyelitis requiring surgical decompression and prolonged IV antibiotics  He is significantly debilitated and would require rehabilitation except taht we have been unable to find placement at the moment  [facility] and PT continue to work with patient who is medically ready for transfer at any time        Author: Alberteen Sam, MD 10/04/2022 3:00 PM  For on call review [email or web address].

## 2022-10-05 DIAGNOSIS — E119 Type 2 diabetes mellitus without complications: Secondary | ICD-10-CM | POA: Diagnosis not present

## 2022-10-05 DIAGNOSIS — E876 Hypokalemia: Secondary | ICD-10-CM | POA: Diagnosis not present

## 2022-10-05 DIAGNOSIS — M4640 Discitis, unspecified, site unspecified: Secondary | ICD-10-CM | POA: Diagnosis not present

## 2022-10-05 DIAGNOSIS — E162 Hypoglycemia, unspecified: Secondary | ICD-10-CM | POA: Diagnosis not present

## 2022-10-05 LAB — GLUCOSE, CAPILLARY
Glucose-Capillary: 118 mg/dL — ABNORMAL HIGH (ref 70–99)
Glucose-Capillary: 169 mg/dL — ABNORMAL HIGH (ref 70–99)
Glucose-Capillary: 231 mg/dL — ABNORMAL HIGH (ref 70–99)
Glucose-Capillary: 156 mg/dL — ABNORMAL HIGH (ref 70–99)

## 2022-10-05 NOTE — Plan of Care (Signed)
Problem: Education: Goal: Understanding of CV disease, CV risk reduction, and recovery process will improve Outcome: Progressing Goal: Individualized Educational Video(s) Outcome: Progressing   Problem: Activity: Goal: Ability to return to baseline activity level will improve Outcome: Progressing   Problem: Cardiovascular: Goal: Ability to achieve and maintain adequate cardiovascular perfusion will improve Outcome: Progressing Goal: Vascular access site(s) Level 0-1 will be maintained Outcome: Progressing   Problem: Health Behavior/Discharge Planning: Goal: Ability to safely manage health-related needs after discharge will improve Outcome: Progressing   Problem: Education: Goal: Knowledge of General Education information will improve Description: Including pain rating scale, medication(s)/side effects and non-pharmacologic comfort measures Outcome: Progressing   Problem: Health Behavior/Discharge Planning: Goal: Ability to manage health-related needs will improve Outcome: Progressing   Problem: Clinical Measurements: Goal: Ability to maintain clinical measurements within normal limits will improve Outcome: Progressing Goal: Will remain free from infection Outcome: Progressing Goal: Diagnostic test results will improve Outcome: Progressing Goal: Respiratory complications will improve Outcome: Progressing Goal: Cardiovascular complication will be avoided Outcome: Progressing   Problem: Activity: Goal: Risk for activity intolerance will decrease Outcome: Progressing   Problem: Nutrition: Goal: Adequate nutrition will be maintained Outcome: Progressing   Problem: Coping: Goal: Level of anxiety will decrease Outcome: Progressing   Problem: Elimination: Goal: Will not experience complications related to bowel motility Outcome: Progressing Goal: Will not experience complications related to urinary retention Outcome: Progressing   Problem: Pain Managment: Goal:  General experience of comfort will improve Outcome: Progressing   Problem: Safety: Goal: Ability to remain free from injury will improve Outcome: Progressing   Problem: Skin Integrity: Goal: Risk for impaired skin integrity will decrease Outcome: Progressing   Problem: Education: Goal: Ability to describe self-care measures that may prevent or decrease complications (Diabetes Survival Skills Education) will improve Outcome: Progressing Goal: Individualized Educational Video(s) Outcome: Progressing   Problem: Coping: Goal: Ability to adjust to condition or change in health will improve Outcome: Progressing   Problem: Fluid Volume: Goal: Ability to maintain a balanced intake and output will improve Outcome: Progressing   Problem: Health Behavior/Discharge Planning: Goal: Ability to identify and utilize available resources and services will improve Outcome: Progressing Goal: Ability to manage health-related needs will improve Outcome: Progressing   Problem: Metabolic: Goal: Ability to maintain appropriate glucose levels will improve Outcome: Progressing   Problem: Nutritional: Goal: Maintenance of adequate nutrition will improve Outcome: Progressing Goal: Progress toward achieving an optimal weight will improve Outcome: Progressing   Problem: Skin Integrity: Goal: Risk for impaired skin integrity will decrease Outcome: Progressing   Problem: Tissue Perfusion: Goal: Adequacy of tissue perfusion will improve Outcome: Progressing   Problem: Education: Goal: Ability to verbalize activity precautions or restrictions will improve Outcome: Progressing Goal: Knowledge of the prescribed therapeutic regimen will improve Outcome: Progressing Goal: Understanding of discharge needs will improve Outcome: Progressing   Problem: Activity: Goal: Ability to avoid complications of mobility impairment will improve Outcome: Progressing Goal: Ability to tolerate increased activity  will improve Outcome: Progressing Goal: Will remain free from falls Outcome: Progressing   Problem: Bowel/Gastric: Goal: Gastrointestinal status for postoperative course will improve Outcome: Progressing   Problem: Clinical Measurements: Goal: Ability to maintain clinical measurements within normal limits will improve Outcome: Progressing Goal: Postoperative complications will be avoided or minimized Outcome: Progressing Goal: Diagnostic test results will improve Outcome: Progressing   Problem: Pain Management: Goal: Pain level will decrease Outcome: Progressing   Problem: Skin Integrity: Goal: Will show signs of wound healing Outcome: Progressing   Problem: Health Behavior/Discharge   Planning: Goal: Identification of resources available to assist in meeting health care needs will improve Outcome: Progressing   Problem: Bladder/Genitourinary: Goal: Urinary functional status for postoperative course will improve Outcome: Progressing   

## 2022-10-05 NOTE — Progress Notes (Addendum)
[name]                                                                              David [name], is a 62 y.o. male, DOB - [birth year], NWG:[id number] Admit date - 08/20/2022    Outpatient Primary MD for the patient is [name], MD  LOS - 43  days  Chief Complaint  Patient presents with   Hypoglycemia       Brief summary  David [name] is a 62 y.o. 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  - Continue Entresto, digoxin, Coreg, Lasix   Essential hypertension -BP stable - Continue Coreg, furosemide, Entresto, clonidine   Acute urinary retention - Failed a voiding trial one week ago, foley replaced 6/25 - Continue Flomax and finasteride -Foley catheter removed this morning, voiding successfully   Persistent atrial fibrillation (HCC) -Rate controlled, continue Coreg, digoxin - Continue Eliquis    Uncontrolled type 2 diabetes with hyperglycemia and hypoglycemia, insulin long term use CBG (last 3)  Recent Labs    10/05/22 0610 10/05/22 1215 10/05/22 1548  GLUCAP 118* 169* 231*   -CBGs controlled, continue Semglee 20 units daily    Anxiety - Continue Buspar   Pleural effusion Stable, asymptomatic. - Repeat CXR in 4-6 weeks, mid July   Acute postoperative anemia due to expected blood loss -H&H stable, improving   Acute toxic encephalopathy At baseline has no cognitive impairment.  Developed encephalopathy (poorly responsive, confused responses) here due to high doses of opiates.  - Resolved now.   Hypokalemia Resolved   AKI (acute kidney injury), ruled out   PVD (peripheral vascular disease) (HCC) S/p angioplasty in April - Continue atorvastatin, apixaban   Hypomagnesemia Resolved   Hyponatremia Mild, asymptomatic   Pure hypercholesterolemia - Continue Lipitor   Estimated body mass index is  22.84 kg/m as calculated from the following:   Height as of this encounter: 6' (1.829 m).   Weight as of this encounter: 76.4 kg.  Code Status: Full code DVT Prophylaxis:  SCD's Start: 08/30/22 1939 apixaban (ELIQUIS) tablet 5 mg   Level of Care: Level of care: Med-Surg Family Communication:  Disposition Plan:      Remains inpatient appropriate: Awaiting placement   Procedures:   Antimicrobials:   Anti-infectives (From admission, onward)    Start     Dose/Rate Route Frequency Ordered Stop    09/28/22 1000  doxycycline (VIBRA-TABS) tablet 100 mg        100 mg Oral Every 12 hours 09/01/22 1113     08/31/22 2000  DAPTOmycin (CUBICIN) 650 mg in sodium chloride 0.9 % IVPB        8 mg/kg  83.3 kg 126 mL/hr over 30 Minutes Intravenous Daily 08/31/22 1053 09/27/22 1432   08/30/22 2300  vancomycin (VANCOREADY) IVPB 750 mg/150 mL  Status:  Discontinued        750 mg 150 mL/hr over 60 Minutes Intravenous Every 12 hours 08/30/22 1150 08/31/22 1053   08/30/22 1712  vancomycin (VANCOCIN) powder  Status:  Discontinued          As needed 08/30/22 1714 08/30/22 1756   08/30/22 1142  ceFAZolin (ANCEF) 2-4 GM/100ML-% IVPB  Status:  Discontinued       Note to Pharmacy: [facility], [place]: cabinet override      08/30/22 1142 08/30/22 1300   08/30/22 0600  ceFAZolin (ANCEF) IVPB 2g/100 mL premix        2 g 200 mL/hr over 30 Minutes Intravenous On call to O.R. 08/29/22 1848 08/30/22 1401   08/27/22 1041  vancomycin (VANCOCIN) IVPB 1000 mg/200 mL premix  Status:  Discontinued        1,000 mg 200 mL/hr over 60 Minutes Intravenous Every 12 hours 08/27/22 0802 08/30/22 1150   08/26/22 2300  vancomycin (VANCOREADY) IVPB 1250 mg/250 mL  Status:  Discontinued        1,250 mg 166.7 mL/hr over 90 Minutes Intravenous Every 24 hours 08/26/22 1014 08/27/22 0802   08/23/22 2200  vancomycin (VANCOREADY) IVPB 750 mg/150 mL  Status:  Discontinued        750 mg 150 mL/hr over 60 Minutes Intravenous Every 12 hours 08/23/22 0938 08/26/22 1014   08/23/22 1030  vancomycin (VANCOREADY) IVPB 1500 mg/300 mL        1,500 mg 150 mL/hr over 120 Minutes Intravenous  Once 08/23/22 0938 08/23/22 1312   08/20/22 2200  doxycycline (VIBRA-TABS) tablet 100 mg  Status:  Discontinued        100 mg Oral 2 times daily 08/20/22 1737 08/23/22 0927          Medications  acetaminophen  1,000 mg Oral TID   apixaban  5 mg Oral BID   atorvastatin  80 mg Oral Daily   busPIRone  15 mg Oral BID   carvedilol  3.125 mg Oral BID    celecoxib  100 mg Oral BID   Chlorhexidine Gluconate Cloth  6 each Topical Daily   digoxin  125 mcg Oral Daily   doxycycline  100 mg Oral Q12H   DULoxetine  20 mg Oral Daily   famotidine  20 mg Oral BID   feeding supplement  237 mL Oral BID BM   ferrous sulfate  325 mg Oral QODAY   finasteride  5 mg Oral Daily   fluticasone  1 spray  Each Nare Daily   furosemide  20 mg Oral Daily   insulin glargine-yfgn  20 Units Subcutaneous Daily   lidocaine  1 patch Transdermal Q24H   lidocaine  20 mL Intradermal Once   magnesium oxide  400 mg Oral BID   pantoprazole  40 mg Oral Daily   polyethylene glycol  17 g Oral BID   pregabalin  75 mg Oral QHS   sacubitril-valsartan  1 tablet Oral BID   senna-docusate  1 tablet Oral BID   sodium chloride flush  10-40 mL Intracatheter Q12H   sodium chloride flush  3 mL Intravenous Q12H   tamsulosin  0.4 mg Oral QPC supper      Subjective:   David [name] was seen and examined today.  No fevers or chills, no acute issues overnight.  Pain controlled.    Objective:   Vitals:   10/05/22 0706 10/05/22 0941 10/05/22 1220 10/05/22 1554  BP:  (!) 139/101 95/60 135/73  Pulse:  69 68 76  Resp:  16 18 16   Temp:  98.4 F (36.9 C) 98.1 F (36.7 C) 98.1 F (36.7 C)  TempSrc:  Oral Oral Oral  SpO2:  99%  100%  Weight: 76.4 kg     Height:        Intake/Output Summary (Last 24 hours) at 10/05/2022 1633 Last data filed at 10/05/2022 1500 Gross per 24 hour  Intake 243 ml  Output 3000 ml  Net -2757 ml     Wt Readings from Last 3 Encounters:  10/05/22 76.4 kg  08/17/22 79.4 kg  07/09/22 81.6 kg     Exam General: Alert and oriented x 3, NAD Cardiovascular: S1 S2 auscultated,  RRR Respiratory: Clear to auscultation bilaterally, no wheezing Gastrointestinal: Soft, nontender, nondistended, + bowel sounds Ext: no pedal edema bilaterally Neuro: no new FND's Psych: Normal affect     Data Reviewed:  I have personally reviewed following labs     CBC Lab Results  Component Value Date   WBC 7.2 10/03/2022   RBC 4.09 (L) 10/03/2022   HGB 12.1 (L) 10/03/2022   HCT 37.2 (L) 10/03/2022   MCV 91.0 10/03/2022   MCH 29.6 10/03/2022   PLT 291 10/03/2022   MCHC 32.5 10/03/2022   RDW 18.8 (H) 10/03/2022   LYMPHSABS 1.1 10/03/2022   MONOABS 1.0 10/03/2022   EOSABS 0.4 10/03/2022   BASOSABS 0.1 10/03/2022     Last metabolic panel Lab Results  Component Value Date   NA 131 (L) 09/29/2022   K 3.9 09/29/2022   CL 98 09/29/2022   CO2 26 09/29/2022   BUN 10 09/29/2022   CREATININE 1.05 09/29/2022   GLUCOSE 140 (H) 09/29/2022   GFRNONAA >60 09/29/2022   GFRAA >60 08/30/2019   CALCIUM 9.2 09/29/2022   PHOS 4.0 08/31/2022   PROT 6.8 09/29/2022   ALBUMIN 2.8 (L) 09/29/2022   LABGLOB 2.4 01/08/2021   AGRATIO 1.7 01/08/2021   BILITOT 0.7 09/29/2022   ALKPHOS 96 09/29/2022   AST 16 09/29/2022   ALT 14 09/29/2022   ANIONGAP 7 09/29/2022    CBG (last 3)  Recent Labs    10/05/22 0610 10/05/22 1215 10/05/22 1548  GLUCAP 118* 169* 231*      Coagulation Profile: No results for input(s): "INR", "PROTIME" in the last 168 hours.   Radiology Studies: I have personally reviewed the imaging studies  No results found.     Thad Ranger M.D. [name] 10/05/2022, 4:33 PM  Available via Epic secure chat  7am-7pm After 7 pm, please refer to night coverage provider listed on amion.

## 2022-10-05 NOTE — TOC Progression Note (Addendum)
Transition of Care [facility]) - Progression Note    Patient Details  Name: David [name] MRN: [id number] Date of Birth: 1960-05-09  Transition of [facility]) CM/SW Contact  Baldemar Lenis, [place] Phone Number: 10/05/2022, 9:55 AM  Clinical Narrative:   CSW noting that patient has Medicaid on Facesheet at this time. CSW contacted [facility] admissions to verify Medicaid coverage for SNF, they will review and update CSW on possible bed offer vs LOG. CSW to follow.  UPDATE: CSW received update from [facility] that patient's Medicaid does not cover SNF, will still need LOG. CSW contacted financial counseling to check on status of referral to [facility], that has not yet been completed. Once that is complete, [facility] can offer. CSW to follow.    Expected Discharge Plan: Skilled Nursing Facility Barriers to Discharge: Inadequate or no insurance, SNF Pending bed offer, SNF Pending payor source - LOG  Expected Discharge Plan and Services   Discharge Planning Services: CM Consult Post Acute Care Choice: Home Health Living arrangements for the past 2 months: Hotel/Motel                                       Social Determinants of Health (SDOH) Interventions SDOH Screenings   Food Insecurity: Patient Unable To Answer (08/23/2022)  Housing: Patient Unable To Answer (08/23/2022)  Transportation Needs: Patient Unable To Answer (08/23/2022)  Utilities: Patient Unable To Answer (08/23/2022)  Depression (PHQ2-9): Low Risk  (11/12/2021)  Stress: Stress Concern Present (09/26/2017)  Tobacco Use: Medium Risk (09/25/2022)    Readmission Risk Interventions    09/06/2021   12:07 PM  Readmission Risk Prevention Plan  Post Dischage Appt Complete  Medication Screening Complete  Transportation Screening Complete

## 2022-10-05 NOTE — Progress Notes (Signed)
Physical Therapy Treatment Patient Details Name: David [name] MRN: [id number] DOB: Sep 01, 1960 Today's Date: 10/05/2022   History of Present Illness Pt is a 62 y.o. male who presented 08/20/22  with hypoglycemia. 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 5/28 surgical repair: T5-11 posterior/posterolateral arthrodesis  & Transpedicular decompression with partial corpectomy, left T8 & T8-9 laminectomy, bilateral facetectomy. 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 Pt able to ambulate up to 118' with RW support on longest of 3 gait bouts this session. Pt needing min guard and RW for support during transfers and gait with cues throughout for wider BOS as pt with x2 instances of scissoring feet as well as self pacing as pt visibly fatigued however pt pushing to ambulate further. Pt continued to benefit from chair follow throughout gait as pt fatigues quickly. Pt with good tolerance for ankle stability exercise, with focus on eversion, as pt with noted inversion during gait with pt needing cues throughout for technique. Pt continues to benefit from skilled PT services to progress toward functional mobility goals.      Assistance Recommended at Discharge Frequent or constant Supervision/Assistance  If plan is discharge home, recommend the following:  Can travel by private vehicle    Assist for transportation;Two people to help with walking and/or transfers;A lot of help with bathing/dressing/bathroom;Assistance with cooking/housework;Direct supervision/assist for financial  management;Direct supervision/assist for medications management;Help with stairs or ramp for entrance   No  Equipment Recommendations  Rolling walker (2 wheels);Wheelchair cushion (measurements PT);Wheelchair (measurements PT);BSC/3in1    Recommendations for Other Services       Precautions / Restrictions Precautions Precautions: Fall;Back Precaution Booklet Issued: No Required Braces or Orthoses: Spinal Brace Spinal Brace: Thoracolumbosacral orthotic;Applied in sitting position Other Brace: for use when ambulating. Restrictions Weight Bearing Restrictions: Yes RLE Weight Bearing: Weight bearing as tolerated     Mobility  Bed Mobility Overal bed mobility: Modified Independent Bed Mobility: Supine to Sit     Supine to sit: Modified independent (Device/Increase time), HOB elevated          Transfers Overall transfer level: Needs assistance Equipment used: Rolling walker (2 wheels) Transfers: Sit to/from Stand, Bed to chair/wheelchair/BSC Sit to Stand: Min guard   Step pivot transfers: Min guard       General transfer comment: min guard from bed and recliner, stepping to recliner at start of session    Ambulation/Gait Ambulation/Gait assistance: Min assist Gait Distance (Feet): 75 Feet (+ 33' + 118') Assistive device: Rolling walker (2 wheels) Gait Pattern/deviations: Knee hyperextension - right, Knee hyperextension - left, Decreased dorsiflexion - right, Decreased dorsiflexion - left, Drifts right/left, Narrow base of support, Knees buckling Gait velocity: reduced     General Gait Details: Continues to have narrow BOS and begins to have R knee hyperext with fatigue. cues for upright posture and wider BOS throughout, cues for self pacing as pt visibly fatigued with gait mechanics deteriorating with pt wanting to keep ambulating   Comptroller  Bed    Modified Rankin (Stroke Patients Only)       Balance Overall  balance assessment: Needs assistance Sitting-balance support: Feet supported, No upper extremity supported Sitting balance-Leahy Scale: Good Sitting balance - Comments: donned TLSO while seated on EOB   Standing balance support: Bilateral upper extremity supported, During functional activity Standing balance-Leahy Scale: Poor Standing balance comment: heavily reliant on UE support in standing                            Cognition Arousal/Alertness: Awake/alert Behavior During Therapy: WFL for tasks assessed/performed Overall Cognitive Status: Within Functional Limits for tasks assessed                                 General Comments: motivated to progress, cues throughout for self pacing        Exercises Other Exercises Other Exercises: long sitting ankle stability exercises with yellow theraband, DF x10 ea side, ankle inversion x10 ea side, ankle eversion x10 ea side, PF x10 ea side    General Comments        Pertinent Vitals/Pain Pain Assessment Pain Assessment: Faces Faces Pain Scale: Hurts little more Pain Location: back Pain Descriptors / Indicators: Discomfort Pain Intervention(s): Monitored during session, Limited activity within patient's tolerance, Repositioned, Premedicated before session    Home Living                          Prior Function            PT Goals (current goals can now be found in the care plan section) Acute Rehab PT Goals Patient Stated Goal: return home and drum again PT Goal Formulation: With patient Time For Goal Achievement: 10/12/22 Progress towards PT goals: Progressing toward goals    Frequency    Min 5X/week      PT Plan Current plan remains appropriate    Co-evaluation              AM-PAC PT "6 Clicks" Mobility   Outcome Measure  Help needed turning from your back to your side while in a flat bed without using bedrails?: A Little Help needed moving from lying on your back to  sitting on the side of a flat bed without using bedrails?: A Little Help needed moving to and from a bed to a chair (including a wheelchair)?: A Little Help needed standing up from a chair using your arms (e.g., wheelchair or bedside chair)?: A Little Help needed to walk in hospital room?: A Lot Help needed climbing 3-5 steps with a railing? : Total 6 Click Score: 15    End of Session Equipment Utilized During Treatment: Back brace Activity Tolerance: Patient tolerated treatment well Patient left: in chair;with call bell/phone within reach Nurse Communication: Mobility status PT Visit Diagnosis: Difficulty in walking, not elsewhere classified (R26.2);Other abnormalities of gait and mobility (R26.89);Muscle weakness (generalized) (M62.81);Pain;Unsteadiness on feet (R26.81) Pain - Right/Left: Left Pain - part of body: Leg     Time: 1131-1205 PT Time Calculation (min) (ACUTE ONLY): 34 min  Charges:    $Gait Training: 8-22 mins $Therapeutic Exercise: 8-22 mins PT General Charges $$ ACUTE PT VISIT: 1 Visit                     Johnell Bas R. PTA Acute Rehabilitation Services Office: [phone]  [employer] Millisa Giarrusso 10/05/2022, 12:48 PM

## 2022-10-05 NOTE — Progress Notes (Signed)
This chaplain is present with the Pt. for F/U spiritual care. The Pt. is preparing for a nap and the conversation is short. The Pt. chooses to update the chaplain on the "paperwork" and his personal decision not to worry.    The Pt. Remains open to continued spiritual care.  Chaplain Stephanie Acre [phone]

## 2022-10-06 DIAGNOSIS — E162 Hypoglycemia, unspecified: Principal | ICD-10-CM

## 2022-10-06 DIAGNOSIS — M4645 Discitis, unspecified, thoracolumbar region: Secondary | ICD-10-CM

## 2022-10-06 DIAGNOSIS — E876 Hypokalemia: Secondary | ICD-10-CM | POA: Diagnosis not present

## 2022-10-06 DIAGNOSIS — D62 Acute posthemorrhagic anemia: Secondary | ICD-10-CM | POA: Diagnosis not present

## 2022-10-06 LAB — GLUCOSE, CAPILLARY
Glucose-Capillary: 132 mg/dL — ABNORMAL HIGH (ref 70–99)
Glucose-Capillary: 166 mg/dL — ABNORMAL HIGH (ref 70–99)
Glucose-Capillary: 200 mg/dL — ABNORMAL HIGH (ref 70–99)
Glucose-Capillary: 174 mg/dL — ABNORMAL HIGH (ref 70–99)

## 2022-10-06 MED ORDER — HYDROMORPHONE HCL 1 MG/ML IJ SOLN
1.0000 mg | Freq: Four times a day (QID) | INTRAMUSCULAR | Status: DC | PRN
  Administered 2022-10-06 – 2022-10-21 (×53): 1 mg via INTRAVENOUS
  Filled 2022-10-06 (×55): qty 1

## 2022-10-06 NOTE — Progress Notes (Signed)
Occupational Therapy Treatment Patient Details Name: David [name] MRN: [id number] DOB: 1960/09/26 Today's Date: 10/06/2022   History of present illness Pt is a 62 y.o. male who presented 08/20/22  with hypoglycemia. 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 5/28 surgical repair: T5-11 posterior/posterolateral arthrodesis  & Transpedicular decompression with partial corpectomy, left T8 & T8-9 laminectomy, bilateral facetectomy. 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 Patient seen with PT to address transfers and functional mobility with RW with patient requiring min guard assist to power up and min assist for safety and stability once up. Patient stood at sink for OT treatment to address grooming. Patient able to stand at sink with min guard assist with one to no UE support but did lean on sink for balance. Patient will benefit from intensive inpatient follow up therapy, >3 hours/day to increase safety and independence with self care and functional transfers. Acute OT to continue to follow.    Recommendations for follow up therapy are one component of a multi-disciplinary discharge planning process, led by the attending physician.  Recommendations may be updated based on patient status, additional functional criteria and insurance authorization.    Assistance Recommended at Discharge Frequent or constant Supervision/Assistance  Patient can return home with the following  Two people to help with walking and/or transfers;Two people to help with bathing/dressing/bathroom   Equipment Recommendations   BSC/3in1;Wheelchair (measurements OT);Wheelchair cushion (measurements OT)    Recommendations for Other Services      Precautions / Restrictions Precautions Precautions: Fall;Back Precaution Booklet Issued: No Required Braces or Orthoses: Spinal Brace Spinal Brace: Thoracolumbosacral orthotic;Applied in sitting position Other Brace: for use when ambulating. Restrictions Weight Bearing Restrictions: Yes RLE Weight Bearing: Weight bearing as tolerated RLE Partial Weight Bearing Percentage or Pounds: heel weight bearing Other Position/Activity Restrictions: with Darco shoe. Has not been using since amp was in 4/24       Mobility Bed Mobility Overal bed mobility: Modified Independent Bed Mobility: Supine to Sit           General bed mobility comments: On EOB upon entry    Transfers Overall transfer level: Needs assistance Equipment used: Rolling walker (2 wheels) Transfers: Sit to/from Stand, Bed to chair/wheelchair/BSC Sit to Stand: Min guard     Step pivot transfers: Min guard     General transfer comment: min guard to power up to stand from EOB and chair     Balance Overall balance assessment: Needs assistance Sitting-balance support: Feet supported, No upper extremity supported Sitting balance-Leahy Scale: Good Sitting balance - Comments: donned TLSO while seated on EOB   Standing balance support: Single extremity supported, Bilateral upper extremity supported, No upper extremity supported, During functional activity Standing balance-Leahy Scale: Poor Standing balance comment: able to stand at sink for grooming tasks with one to no extremity support but leans on sink for support                           ADL either performed or assessed with clinical judgement   ADL Overall ADL's : Needs assistance/impaired     Grooming:  Wash/dry hands;Wash/dry face;Min guard;Standing Grooming Details (indicate cue type and reason): shaved face and head with  electric razor with sink for support. Patient with no UE support but leaning on sink                               General ADL Comments: able to donn sock in supine    Extremity/Trunk Assessment              Vision       Perception     Praxis      Cognition Arousal/Alertness: Awake/alert Behavior During Therapy: WFL for tasks assessed/performed Overall Cognitive Status: Within Functional Limits for tasks assessed                                 General Comments: in good spirits, motivated over progress        Exercises      Shoulder Instructions       General Comments      Pertinent Vitals/ Pain       Pain Assessment Pain Assessment: Faces Faces Pain Scale: Hurts a little bit Pain Location: back Pain Descriptors / Indicators: Discomfort Pain Intervention(s): Premedicated before session, Limited activity within patient's tolerance, Monitored during session  Home Living                                          Prior Functioning/Environment              Frequency  Min 2X/week        Progress Toward Goals  OT Goals(current goals can now be found in the care plan section)  Progress towards OT goals: Progressing toward goals  Acute Rehab OT Goals Patient Stated Goal: get more rehab to return home OT Goal Formulation: With patient Time For Goal Achievement: 10/10/22 Potential to Achieve Goals: Good ADL Goals Pt Will Perform Grooming: Independently;standing Pt Will Perform Lower Body Bathing: with modified independence;sit to/from stand;with adaptive equipment Pt Will Perform Lower Body Dressing: with modified independence;sit to/from stand;with adaptive equipment Pt Will Transfer to Toilet: with modified independence;ambulating;regular height toilet Pt Will Perform Toileting - Clothing Manipulation and hygiene: with modified independence;sit to/from stand Additional ADL Goal #1: Pt to increase  standing tolerance to 8-10 min during functional tasks  Plan Discharge plan remains appropriate;Frequency remains appropriate    Co-evaluation                 AM-PAC OT "6 Clicks" Daily Activity     Outcome Measure   Help from another person eating meals?: None Help from another person taking care of personal grooming?: A Little Help from another person toileting, which includes using toliet, bedpan, or urinal?: A Little Help from another person bathing (including washing, rinsing, drying)?: A Little Help from another person to put on and taking off regular upper body clothing?: A Little Help from another person to put on and taking off regular lower body clothing?: A Little 6 Click Score: 19    End of Session Equipment Utilized During Treatment: Gait belt;Rolling walker (2 wheels)  OT Visit Diagnosis: Unsteadiness on feet (R26.81);Other abnormalities of gait and mobility (R26.89);Muscle weakness (generalized) (M62.81);Pain Pain - part of body:  (back)   Activity Tolerance Patient tolerated treatment well  Patient Left in chair;with call bell/phone within reach;with chair alarm set   Nurse Communication Mobility status        Time: 4098-1191 OT Time Calculation (min): 64 min  Charges: OT General Charges $OT Visit: 1 Visit OT Treatments $Self Care/Home Management : 8-22 mins $Therapeutic Activity: 8-22 mins  [name], OTA Acute Rehabilitation Services  Office [phone]   Dewain Penning 10/06/2022, 2:21 PM

## 2022-10-06 NOTE — Progress Notes (Signed)
[name]                                                                              Sebert Addleman, is a 62 y.o. male, DOB - 1961-01-26, GNF:[id number] Admit date - 08/20/2022    Outpatient Primary MD for the patient is [name], MD  LOS - 44  days  Chief Complaint  Patient presents with   Hypoglycemia       Brief summary  Mr. Vandecar is a 62 y.o. 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  - Fentanyl patch weaned off on 7/3, however today complaining of nausea and pain, requesting IV pain medication -Continue duloxetine, acetaminophen and Celebrex     Chronic systolic CHF  (congestive heart failure) (HCC) -EF 35-40% in 2020, now resolved to normal -Compensated, appears euvolemic.  - Continue Entresto, digoxin, Coreg, Lasix   Essential hypertension -BP stable - Continue Coreg, furosemide, Entresto, clonidine   Acute urinary retention - Failed voiding trial and Foley was replaced on 6/25 - Continue Flomax and finasteride -Foley catheter removed on 7/3, voiding successfully   Persistent atrial fibrillation (HCC) -Rate controlled, continue Coreg, digoxin - Continue Eliquis    Uncontrolled type 2 diabetes with hyperglycemia and hypoglycemia, insulin long term use CBG (last 3)  Recent Labs    10/05/22 2305 10/06/22 0648 10/06/22 1136  GLUCAP 156* 132* 166*   -CBGs controlled, continue Semglee 20 units daily    Anxiety - Continue Buspar   Pleural effusion Stable, asymptomatic. - Repeat CXR in 4-6 weeks, mid July   Acute postoperative anemia due to expected blood loss -H&H stable, improving   Acute toxic encephalopathy At baseline has no cognitive impairment.  Developed encephalopathy (poorly responsive, confused responses) here due to high doses of opiates.  - Resolved now.  Alert and oriented.   Hypokalemia Resolved   AKI (acute kidney injury), ruled out   PVD (peripheral vascular disease) (HCC) S/p angioplasty in April - Continue atorvastatin, apixaban   Hypomagnesemia Resolved   Hyponatremia Mild, asymptomatic -  Follow bmet in a.m.   Pure hypercholesterolemia - Continue Lipitor   Estimated body mass index is 22.84 kg/m as calculated from the following:   Height as of this encounter: 6' (1.829 m).   Weight as of this encounter: 76.4 kg.  Code Status: Full code DVT Prophylaxis:  SCD's Start: 08/30/22 1939 apixaban (ELIQUIS) tablet 5 mg   Level of Care: Level of care: Med-Surg Family Communication:  Disposition Plan:      Remains inpatient appropriate: Awaiting placement   Procedures:   Antimicrobials:    Anti-infectives (From admission, onward)    Start     Dose/Rate Route Frequency Ordered Stop   09/28/22 1000  doxycycline (VIBRA-TABS) tablet 100 mg        100 mg Oral Every 12 hours 09/01/22 1113     08/31/22 2000  DAPTOmycin (CUBICIN) 650 mg in sodium chloride 0.9 % IVPB        8 mg/kg  83.3 kg 126 mL/hr over 30 Minutes Intravenous Daily 08/31/22 1053 09/27/22 1432   08/30/22 2300  vancomycin (VANCOREADY) IVPB 750 mg/150 mL  Status:  Discontinued        750 mg 150 mL/hr over 60 Minutes Intravenous Every 12 hours 08/30/22 1150 08/31/22 1053   08/30/22 1712  vancomycin (VANCOCIN) powder  Status:  Discontinued          As needed 08/30/22 1714 08/30/22 1756   08/30/22 1142  ceFAZolin (ANCEF) 2-4 GM/100ML-% IVPB  Status:  Discontinued       Note to Pharmacy: [employer], [place]: cabinet override      08/30/22 1142 08/30/22 1300   08/30/22 0600  ceFAZolin (ANCEF) IVPB 2g/100 mL premix        2 g 200 mL/hr over 30 Minutes Intravenous On call to O.R. 08/29/22 1848 08/30/22 1401   08/27/22 1041  vancomycin (VANCOCIN) IVPB 1000 mg/200 mL premix  Status:  Discontinued        1,000 mg 200 mL/hr over 60 Minutes Intravenous Every 12 hours 08/27/22 0802 08/30/22 1150   08/26/22 2300  vancomycin (VANCOREADY) IVPB 1250 mg/250 mL  Status:  Discontinued        1,250 mg 166.7 mL/hr over 90 Minutes Intravenous Every 24 hours 08/26/22 1014 08/27/22 0802   08/23/22 2200  vancomycin (VANCOREADY) IVPB 750 mg/150 mL  Status:  Discontinued        750 mg 150 mL/hr over 60 Minutes Intravenous Every 12 hours 08/23/22 0938 08/26/22 1014   08/23/22 1030  vancomycin (VANCOREADY) IVPB 1500 mg/300 mL        1,500 mg 150 mL/hr over 120 Minutes Intravenous  Once 08/23/22 0938 08/23/22 1312   08/20/22 2200  doxycycline (VIBRA-TABS) tablet 100 mg  Status:  Discontinued        100 mg Oral 2 times daily 08/20/22 1737 08/23/22 0927          Medications  acetaminophen  1,000 mg Oral TID   apixaban  5 mg Oral BID    atorvastatin  80 mg Oral Daily   busPIRone  15 mg Oral BID   carvedilol  3.125 mg Oral BID   celecoxib  100 mg Oral BID   Chlorhexidine Gluconate Cloth  6 each Topical Daily   digoxin  125 mcg Oral Daily   doxycycline  100 mg Oral Q12H   DULoxetine  20 mg Oral Daily   famotidine  20 mg Oral BID   feeding supplement  237 mL Oral BID BM   ferrous sulfate  325 mg Oral QODAY   finasteride  5 mg Oral Daily   fluticasone  1 spray Each Nare Daily   furosemide  20 mg Oral Daily   insulin glargine-yfgn  20 Units Subcutaneous Daily   lidocaine  1 patch Transdermal Q24H   lidocaine  20 mL Intradermal Once   magnesium oxide  400 mg Oral BID   pantoprazole  40 mg Oral Daily   polyethylene glycol  17 g Oral BID   pregabalin  75 mg Oral QHS   sacubitril-valsartan  1 tablet Oral BID   senna-docusate  1 tablet Oral BID   sodium chloride flush  10-40 mL Intracatheter Q12H   sodium chloride flush  3 mL Intravenous Q12H   tamsulosin  0.4 mg Oral QPC supper      Subjective:   Ethyn Wenderoth was seen and examined today.  States having nausea today, back pain.  Overnight no acute issues.  No fevers or chills.  Objective:   Vitals:   10/05/22 2300 10/06/22 0300 10/06/22 0825 10/06/22 1300  BP: (!) 150/81 (!) 146/92 122/79 99/74  Pulse: 60 63 84 80  Resp: 17 17 19 19   Temp: 98.4 F (36.9 C) 98.2 F (36.8 C) 98.2 F (36.8 C) 98.2 F (36.8 C)  TempSrc: Oral Oral Oral Oral  SpO2: 100% 99% 98% 98%  Weight:      Height:        Intake/Output Summary (Last 24 hours) at 10/06/2022 1404 Last data filed at 10/06/2022 0400 Gross per 24 hour  Intake 240 ml  Output 16109 ml  Net -13060 ml     Wt Readings from Last 3 Encounters:  10/05/22 76.4 kg  08/17/22 79.4 kg  07/09/22 81.6 kg    Physical Exam General: Alert and oriented x 3, NAD Cardiovascular: S1 S2 clear, RRR.  Respiratory: CTAB, no wheezing Gastrointestinal: Soft, nontender, nondistended, NBS Ext: no pedal edema bilaterally Neuro:  no new deficits Skin: No rashes Psych: Normal affect     Data Reviewed:  I have personally reviewed following labs    CBC Lab Results  Component Value Date   WBC 7.2 10/03/2022   RBC 4.09 (L) 10/03/2022   HGB 12.1 (L) 10/03/2022   HCT 37.2 (L) 10/03/2022   MCV 91.0 10/03/2022   MCH 29.6 10/03/2022   PLT 291 10/03/2022   MCHC 32.5 10/03/2022   RDW 18.8 (H) 10/03/2022   LYMPHSABS 1.1 10/03/2022   MONOABS 1.0 10/03/2022   EOSABS 0.4 10/03/2022   BASOSABS 0.1 10/03/2022     Last metabolic panel Lab Results  Component Value Date   NA 131 (L) 09/29/2022   K 3.9 09/29/2022   CL 98 09/29/2022   CO2 26 09/29/2022   BUN 10 09/29/2022   CREATININE 1.05 09/29/2022   GLUCOSE 140 (H) 09/29/2022   GFRNONAA >60 09/29/2022   GFRAA >60 08/30/2019   CALCIUM 9.2 09/29/2022   PHOS 4.0 08/31/2022   PROT 6.8 09/29/2022   ALBUMIN 2.8 (L) 09/29/2022   LABGLOB 2.4 01/08/2021   AGRATIO 1.7 01/08/2021   BILITOT 0.7 09/29/2022   ALKPHOS 96 09/29/2022   AST 16 09/29/2022   ALT 14 09/29/2022   ANIONGAP 7 09/29/2022    CBG (last 3)  Recent Labs    10/05/22 2305 10/06/22 0648 10/06/22 1136  GLUCAP 156* 132* 166*      Coagulation Profile: No results for input(s): "INR", "PROTIME" in the last 168 hours.   Radiology Studies: I have personally reviewed the imaging studies  No results found.     Thad Ranger M.D. [name] 10/06/2022, 2:04 PM  Available via Epic secure chat 7am-7pm After 7 pm, please refer to night coverage provider listed on amion.

## 2022-10-06 NOTE — Progress Notes (Signed)
Physical Therapy Treatment Patient Details Name: David [name] MRN: [id number] DOB: 05-17-1960 Today's Date: 10/06/2022   History of Present Illness Pt is a 62 y.o. male who presented 08/20/22  with hypoglycemia. 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 5/28 surgical repair: T5-11 posterior/posterolateral arthrodesis  & Transpedicular decompression with partial corpectomy, left T8 & T8-9 laminectomy, bilateral facetectomy. 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 Pt sat self up to EOB when therapist arrived and able to don TLSO with supervision. Min guard for sit<>stand from EOB and min guard/touching assist for sit<>stands from side of transport chair during session with seated rest between gait bouts. Pt ambulated ~125' in single bout in hallway at start and then dependently transported outside and uneven ground gait training. Pt ambulated 3 bouts of ~60' with min assist to stabilize walker and steady, cues to activate gluteals for posture and quad activation with ambulation up slight incline. EOS pt transported back to room and completed sit>stand at sink. Pt with OT for self care tasks with chair behind pt for safety. Continue to recommend intense therapy follow up. Will progress as able.    Assistance Recommended at Discharge Frequent or constant Supervision/Assistance  If plan is discharge home, recommend the following:  Can travel by private vehicle    Assist for transportation;Two people to help with walking and/or transfers;A lot of help with bathing/dressing/bathroom;Assistance with  cooking/housework;Direct supervision/assist for financial management;Direct supervision/assist for medications management;Help with stairs or ramp for entrance   No  Equipment Recommendations  Rolling walker (2 wheels);Wheelchair cushion (measurements PT);Wheelchair (measurements PT);BSC/3in1    Recommendations for Other Services       Precautions / Restrictions Precautions Precautions: Fall;Back Precaution Booklet Issued: No Required Braces or Orthoses: Spinal Brace Spinal Brace: Thoracolumbosacral orthotic;Applied in sitting position Other Brace: for use when ambulating. Restrictions Weight Bearing Restrictions: Yes RLE Weight Bearing: Weight bearing as tolerated RLE Partial Weight Bearing Percentage or Pounds: heel weight bearing Other Position/Activity Restrictions: with Darco shoe. Has not been using since amp was in 4/24     Mobility  Bed Mobility Overal bed mobility: Modified Independent Bed Mobility: Supine to Sit     Supine to sit: Modified independent (Device/Increase time), HOB elevated     General bed mobility comments: pt sat self up to EOB in prepartion for therapy    Transfers Overall transfer level: Needs assistance Equipment used: Rolling walker (2 wheels) Transfers: Sit to/from Stand, Bed to chair/wheelchair/BSC Sit to Stand: Min guard   Step pivot transfers: Min guard       General transfer comment: min guard from bed and WC (side of transport chair), pt reliant on bil UE to power up. Mod I to pivot from fornt facing to side facing on transport chair.    Ambulation/Gait Ambulation/Gait assistance: Min assist Gait Distance (Feet): 125 Feet (+3 x ~60) Assistive device: Rolling walker (2 wheels) Gait Pattern/deviations: Knee hyperextension - right, Knee hyperextension - left, Decreased dorsiflexion - right, Decreased dorsiflexion - left, Drifts right/left, Narrow base of support, Knees buckling Gait velocity: reduced     General Gait Details:  at start of  gait pt with good BOS and widened step width, no significnat hyperextension at bil knees noted. As pt fatgiued Rt >Lt knee hyperextending in stance. BOS narrows with turns and pt with scissoring steps while turning within RW. Cues to activate gluteals for improved posture and quad activation requires during outdoor ambulation. Longest bout first bout indoors in hallway. 3 remaining bouts ~60' with chair follow. Pt navigating slight incline on 2nd and 3rd bouts and step length shortened.   Stairs             Wheelchair Mobility     Tilt Bed    Modified Rankin (Stroke Patients Only)       Balance Overall balance assessment: Needs assistance Sitting-balance support: Feet supported, No upper extremity supported Sitting balance-Leahy Scale: Good Sitting balance - Comments: donned TLSO while seated on EOB   Standing balance support: Bilateral upper extremity supported, During functional activity Standing balance-Leahy Scale: Poor Standing balance comment: heavily reliant on UE support in standing                            Cognition Arousal/Alertness: Awake/alert Behavior During Therapy: WFL for tasks assessed/performed Overall Cognitive Status: Within Functional Limits for tasks assessed                                 General Comments: motivated to progress, cues throughout for self pacing        Exercises      General Comments        Pertinent Vitals/Pain Pain Assessment Pain Assessment: Faces Faces Pain Scale: Hurts a little bit Pain Location: back Pain Descriptors / Indicators: Discomfort Pain Intervention(s): Limited activity within patient's tolerance, Monitored during session, Premedicated before session, Repositioned    Home Living                          Prior Function            PT Goals (current goals can now be found in the care plan section) Acute Rehab PT Goals Patient Stated Goal: return home  and drum again PT Goal Formulation: With patient Time For Goal Achievement: 10/12/22 Progress towards PT goals: Progressing toward goals    Frequency    Min 5X/week      PT Plan Current plan remains appropriate    Co-evaluation              AM-PAC PT "6 Clicks" Mobility   Outcome Measure  Help needed turning from your back to your side while in a flat bed without using bedrails?: A Little Help needed moving from lying on your back to sitting on the side of a flat bed without using bedrails?: A Little Help needed moving to and from a bed to a chair (including a wheelchair)?: A Little Help needed standing up from a chair using your arms (e.g., wheelchair or bedside chair)?: A Little Help needed to walk in hospital room?: A Little Help needed climbing 3-5 steps with a railing? : Total 6 Click Score: 16    End of Session Equipment Utilized During Treatment: Back brace Activity Tolerance: Patient tolerated treatment well Patient left: in chair;with call bell/phone within reach Nurse Communication: Mobility status PT Visit Diagnosis: Difficulty in walking, not elsewhere classified (R26.2);Other abnormalities of gait and mobility (R26.89);Muscle weakness (generalized) (M62.81);Pain;Unsteadiness on feet (R26.81) Pain -  Right/Left: Left Pain - part of body: Leg     Time: 1610-9604 PT Time Calculation (min) (ACUTE ONLY): 49 min  Charges:    $Gait Training: 23-37 mins PT General Charges $$ ACUTE PT VISIT: 1 Visit                     Wynn Maudlin, DPT Acute Rehabilitation Services Office [phone]  10/06/22 12:47 PM

## 2022-10-07 DIAGNOSIS — G929 Unspecified toxic encephalopathy: Secondary | ICD-10-CM | POA: Diagnosis not present

## 2022-10-07 DIAGNOSIS — E119 Type 2 diabetes mellitus without complications: Secondary | ICD-10-CM | POA: Diagnosis not present

## 2022-10-07 DIAGNOSIS — E876 Hypokalemia: Secondary | ICD-10-CM | POA: Diagnosis not present

## 2022-10-07 DIAGNOSIS — E162 Hypoglycemia, unspecified: Secondary | ICD-10-CM | POA: Diagnosis not present

## 2022-10-07 LAB — CBC
HCT: 39.1 % (ref 39.0–52.0)
Hemoglobin: 12.6 g/dL — ABNORMAL LOW (ref 13.0–17.0)
MCH: 29.3 pg (ref 26.0–34.0)
MCHC: 32.2 g/dL (ref 30.0–36.0)
MCV: 90.9 fL (ref 80.0–100.0)
Platelets: 237 10*3/uL (ref 150–400)
RBC: 4.3 MIL/uL (ref 4.22–5.81)
RDW: 18.5 % — ABNORMAL HIGH (ref 11.5–15.5)
WBC: 5.6 10*3/uL (ref 4.0–10.5)
nRBC: 0 % (ref 0.0–0.2)

## 2022-10-07 LAB — BASIC METABOLIC PANEL
Anion gap: 6 (ref 5–15)
BUN: 16 mg/dL (ref 8–23)
CO2: 27 mmol/L (ref 22–32)
Calcium: 9.8 mg/dL (ref 8.9–10.3)
Chloride: 100 mmol/L (ref 98–111)
Creatinine, Ser: 1.28 mg/dL — ABNORMAL HIGH (ref 0.61–1.24)
GFR, Estimated: >60 mL/min (ref >60)
Glucose, Bld: 144 mg/dL — ABNORMAL HIGH (ref 70–99)
Potassium: 4.6 mmol/L (ref 3.5–5.1)
Sodium: 133 mmol/L — ABNORMAL LOW (ref 135–145)

## 2022-10-07 LAB — GLUCOSE, CAPILLARY
Glucose-Capillary: 139 mg/dL — ABNORMAL HIGH (ref 70–99)
Glucose-Capillary: 211 mg/dL — ABNORMAL HIGH (ref 70–99)
Glucose-Capillary: 179 mg/dL — ABNORMAL HIGH (ref 70–99)
Glucose-Capillary: 178 mg/dL — ABNORMAL HIGH (ref 70–99)

## 2022-10-07 MED ORDER — INSULIN ASPART 100 UNIT/ML IJ SOLN
0.0000 [IU] | Freq: Three times a day (TID) | INTRAMUSCULAR | Status: DC
  Administered 2022-10-07 – 2022-10-08 (×3): 2 [IU] via SUBCUTANEOUS
  Administered 2022-10-09: 1 [IU] via SUBCUTANEOUS
  Administered 2022-10-09 (×2): 3 [IU] via SUBCUTANEOUS
  Administered 2022-10-10: 1 [IU] via SUBCUTANEOUS
  Administered 2022-10-10 (×2): 2 [IU] via SUBCUTANEOUS
  Administered 2022-10-11 (×3): 1 [IU] via SUBCUTANEOUS
  Administered 2022-10-12 – 2022-10-13 (×5): 2 [IU] via SUBCUTANEOUS
  Administered 2022-10-14 (×2): 3 [IU] via SUBCUTANEOUS
  Administered 2022-10-14: 1 [IU] via SUBCUTANEOUS
  Administered 2022-10-15 (×3): 2 [IU] via SUBCUTANEOUS
  Administered 2022-10-16: 1 [IU] via SUBCUTANEOUS
  Administered 2022-10-16 (×2): 2 [IU] via SUBCUTANEOUS
  Administered 2022-10-17: 3 [IU] via SUBCUTANEOUS
  Administered 2022-10-17: 2 [IU] via SUBCUTANEOUS
  Administered 2022-10-18 (×2): 3 [IU] via SUBCUTANEOUS
  Administered 2022-10-18: 2 [IU] via SUBCUTANEOUS
  Administered 2022-10-19: 3 [IU] via SUBCUTANEOUS
  Administered 2022-10-19: 1 [IU] via SUBCUTANEOUS
  Administered 2022-10-19: 3 [IU] via SUBCUTANEOUS
  Administered 2022-10-20: 5 [IU] via SUBCUTANEOUS
  Administered 2022-10-20 (×2): 2 [IU] via SUBCUTANEOUS
  Administered 2022-10-21: 1 [IU] via SUBCUTANEOUS
  Administered 2022-10-21: 3 [IU] via SUBCUTANEOUS
  Administered 2022-10-21 – 2022-10-23 (×6): 2 [IU] via SUBCUTANEOUS
  Administered 2022-10-23 – 2022-10-24 (×2): 3 [IU] via SUBCUTANEOUS
  Administered 2022-10-24 – 2022-10-25 (×3): 2 [IU] via SUBCUTANEOUS
  Administered 2022-10-25: 3 [IU] via SUBCUTANEOUS
  Administered 2022-10-25: 2 [IU] via SUBCUTANEOUS
  Administered 2022-10-26 (×2): 1 [IU] via SUBCUTANEOUS
  Administered 2022-10-26: 2 [IU] via SUBCUTANEOUS
  Administered 2022-10-27 (×2): 1 [IU] via SUBCUTANEOUS
  Administered 2022-10-27: 5 [IU] via SUBCUTANEOUS
  Administered 2022-10-28: 1 [IU] via SUBCUTANEOUS
  Administered 2022-10-28: 3 [IU] via SUBCUTANEOUS

## 2022-10-07 MED ORDER — INSULIN ASPART 100 UNIT/ML IJ SOLN
0.0000 [IU] | Freq: Every day | INTRAMUSCULAR | Status: DC
  Administered 2022-10-12 – 2022-10-23 (×4): 2 [IU] via SUBCUTANEOUS
  Administered 2022-10-27: 5 [IU] via SUBCUTANEOUS

## 2022-10-07 NOTE — Progress Notes (Signed)
[name]                                                                              David [name], is a 62 y.o. male, DOB - [birth year], ZOX:[id number] Admit date - 08/20/2022    Outpatient Primary MD for the patient is [name], MD  LOS - 44  days  Chief Complaint  Patient presents with   Hypoglycemia       Brief summary  David [name] is a 62 y.o. 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 as needed, Dilaudid IV as needed -Recommend fentanyl patch for long-acting pain control rather than IV Dilaudid, patient currently favors IV as needed pain medication for severe or  breakthrough pain. -Continue duloxetine, acetaminophen and Celebrex     Chronic systolic CHF (congestive heart failure) (HCC) -EF 35-40% in 2020, now resolved to normal -Compensated, appears euvolemic.  - Continue Entresto, digoxin, Coreg, Lasix   Essential hypertension -BP stable - Continue Coreg, furosemide, Entresto, clonidine   Acute urinary retention - Failed voiding trial and Foley was replaced on 6/25 - Continue Flomax and finasteride -Foley catheter removed on 7/3, voiding successfully   Persistent atrial fibrillation (HCC) -Rate controlled, continue Coreg, digoxin - Continue Eliquis    Uncontrolled type 2 diabetes with hyperglycemia and hypoglycemia, insulin long term use CBG (last 3)  -Hemoglobin A1c 9.1 on 07/13/2022, will repeat CBG (last 3)  Recent Labs    10/06/22 1609 10/06/22 2142 10/07/22 0643  GLUCAP 200* 174* 139*  -Continue Semglee 20 units daily -Added sliding scale insulin sensitive, 3 times daily AC, at bedtime    Anxiety - Continue Buspar   Pleural effusion Stable, asymptomatic. - Repeat CXR in 4-6 weeks, mid July   Acute postoperative anemia due to expected blood loss -H&H stable   Acute toxic encephalopathy At baseline has no cognitive impairment.  Developed encephalopathy (poorly responsive, confused responses) here due to high doses of opiates.  - Resolved now.  Alert and oriented.   Hypokalemia Resolved  AKI (acute kidney injury), ruled out   PVD (peripheral vascular disease) (HCC) S/p angioplasty in April - Continue atorvastatin, apixaban   Hypomagnesemia Resolved   Hyponatremia Mild, asymptomatic -Improving   Pure hypercholesterolemia - Continue Lipitor   Estimated body mass index is 22.84 kg/m as calculated from the following:   Height as of this encounter: 6' (1.829 m).   Weight as of this encounter: 76.4 kg.  Code Status: Full code DVT Prophylaxis:  SCD's Start: 08/30/22 1939 apixaban (ELIQUIS) tablet 5 mg    Level of Care: Level of care: Med-Surg Family Communication:  Disposition Plan:      Remains inpatient appropriate: Awaiting placement   Procedures:   Antimicrobials:   Anti-infectives (From admission, onward)    Start     Dose/Rate Route Frequency Ordered Stop   09/28/22 1000  doxycycline (VIBRA-TABS) tablet 100 mg        100 mg Oral Every 12 hours 09/01/22 1113     08/31/22 2000  DAPTOmycin (CUBICIN) 650 mg in sodium chloride 0.9 % IVPB        8 mg/kg  83.3 kg 126 mL/hr over 30 Minutes Intravenous Daily 08/31/22 1053 09/27/22 1432   08/30/22 2300  vancomycin (VANCOREADY) IVPB 750 mg/150 mL  Status:  Discontinued        750 mg 150 mL/hr over 60 Minutes Intravenous Every 12 hours 08/30/22 1150 08/31/22 1053   08/30/22 1712  vancomycin (VANCOCIN) powder  Status:  Discontinued          As needed 08/30/22 1714 08/30/22 1756   08/30/22 1142  ceFAZolin (ANCEF) 2-4 GM/100ML-% IVPB  Status:  Discontinued       Note to Pharmacy: [facility], [place]: cabinet override      08/30/22 1142 08/30/22 1300   08/30/22 0600  ceFAZolin (ANCEF) IVPB 2g/100 mL premix        2 g 200 mL/hr over 30 Minutes Intravenous On call to O.R. 08/29/22 1848 08/30/22 1401   08/27/22 1041  vancomycin (VANCOCIN) IVPB 1000 mg/200 mL premix  Status:  Discontinued        1,000 mg 200 mL/hr over 60 Minutes Intravenous Every 12 hours 08/27/22 0802 08/30/22 1150   08/26/22 2300  vancomycin (VANCOREADY) IVPB 1250 mg/250 mL  Status:  Discontinued        1,250 mg 166.7 mL/hr over 90 Minutes Intravenous Every 24 hours 08/26/22 1014 08/27/22 0802   08/23/22 2200  vancomycin (VANCOREADY) IVPB 750 mg/150 mL  Status:  Discontinued        750 mg 150 mL/hr over 60 Minutes Intravenous Every 12 hours 08/23/22 0938 08/26/22 1014   08/23/22 1030  vancomycin (VANCOREADY) IVPB 1500 mg/300 mL        1,500 mg 150 mL/hr over 120 Minutes Intravenous  Once 08/23/22 0938 08/23/22 1312   08/20/22 2200  doxycycline (VIBRA-TABS) tablet 100 mg   Status:  Discontinued        100 mg Oral 2 times daily 08/20/22 1737 08/23/22 0927          Medications  acetaminophen  1,000 mg Oral TID   apixaban  5 mg Oral BID   atorvastatin  80 mg Oral Daily   busPIRone  15 mg Oral BID   carvedilol  3.125 mg Oral BID   celecoxib  100 mg Oral BID   Chlorhexidine Gluconate Cloth  6 each Topical Daily   digoxin  125 mcg Oral Daily   doxycycline  100 mg Oral Q12H   DULoxetine  20 mg Oral Daily   famotidine  20 mg Oral BID   feeding supplement  237 mL Oral BID BM   ferrous sulfate  325 mg Oral QODAY   finasteride  5 mg Oral Daily   fluticasone  1 spray Each Nare Daily   furosemide  20 mg Oral Daily   insulin glargine-yfgn  20 Units Subcutaneous Daily   lidocaine  1 patch Transdermal Q24H   lidocaine  20 mL Intradermal Once   magnesium oxide  400 mg Oral BID   pantoprazole  40 mg Oral Daily   polyethylene glycol  17 g Oral BID   pregabalin  75 mg Oral QHS   sacubitril-valsartan  1 tablet Oral BID   senna-docusate  1 tablet Oral BID   sodium chloride flush  10-40 mL Intracatheter Q12H   sodium chloride flush  3 mL Intravenous Q12H   tamsulosin  0.4 mg Oral QPC supper      Subjective:   David [name] was seen and examined today.  States pain is better controlled today, no acute issues.  Did not sleep well last night, no fevers or chills.   Objective:   Vitals:   10/05/22 2300 10/06/22 0300 10/06/22 0825 10/06/22 1300  BP: (!) 150/81 (!) 146/92 122/79 99/74  Pulse: 60 63 84 80  Resp: 17 17 19 19   Temp: 98.4 F (36.9 C) 98.2 F (36.8 C) 98.2 F (36.8 C) 98.2 F (36.8 C)  TempSrc: Oral Oral Oral Oral  SpO2: 100% 99% 98% 98%  Weight:      Height:        Intake/Output Summary (Last 24 hours) at 10/06/2022 1404 Last data filed at 10/06/2022 0400 Gross per 24 hour  Intake 240 ml  Output 16109 ml  Net -13060 ml     Wt Readings from Last 3 Encounters:  10/05/22 76.4 kg  08/17/22 79.4 kg  07/09/22 81.6 kg   Physical  Exam General: Alert and oriented x 3, NAD Cardiovascular: S1 S2 clear, RRR.  Respiratory: CTAB, no wheezing Gastrointestinal: Soft, nontender, nondistended, NBS Ext: no pedal edema bilaterally Neuro: no new deficits Psych: Normal affect     Data Reviewed:  I have personally reviewed following labs    CBC Lab Results  Component Value Date   WBC 7.2 10/03/2022   RBC 4.09 (L) 10/03/2022   HGB 12.1 (L) 10/03/2022   HCT 37.2 (L) 10/03/2022   MCV 91.0 10/03/2022   MCH 29.6 10/03/2022   PLT 291 10/03/2022   MCHC 32.5 10/03/2022   RDW 18.8 (H) 10/03/2022   LYMPHSABS 1.1 10/03/2022   MONOABS 1.0 10/03/2022   EOSABS 0.4 10/03/2022   BASOSABS 0.1 10/03/2022     Last metabolic panel Lab Results  Component Value Date   NA 131 (L) 09/29/2022   K 3.9 09/29/2022   CL 98 09/29/2022   CO2 26 09/29/2022   BUN 10 09/29/2022   CREATININE 1.05 09/29/2022   GLUCOSE 140 (H) 09/29/2022   GFRNONAA >60 09/29/2022   GFRAA >60 08/30/2019   CALCIUM 9.2 09/29/2022   PHOS 4.0 08/31/2022   PROT 6.8 09/29/2022   ALBUMIN 2.8 (L) 09/29/2022   LABGLOB 2.4 01/08/2021   AGRATIO 1.7 01/08/2021   BILITOT 0.7 09/29/2022   ALKPHOS 96 09/29/2022   AST 16 09/29/2022   ALT 14 09/29/2022   ANIONGAP 7 09/29/2022    CBG (last 3)  Recent Labs    10/05/22 2305 10/06/22 0648 10/06/22 1136  GLUCAP 156* 132* 166*  Coagulation Profile: No results for input(s): "INR", "PROTIME" in the last 168 hours.   Radiology Studies: I have personally reviewed the imaging studies  No results found.     Thad Ranger M.D. [name] 10/06/2022, 2:04 PM  Available via Epic secure chat 7am-7pm After 7 pm, please refer to night coverage provider listed on amion.

## 2022-10-07 NOTE — Progress Notes (Signed)
PT Cancellation Note  Patient Details Name: David [name] MRN: [id number] DOB: 1960-06-10   Cancelled Treatment:    Reason Eval/Treat Not Completed: Pain limiting ability to participate (pt reports hurting and weaning off pain patch. pt request attempt therapy tomorrow. Will follow up at later date/time as schedule allows.)   Renaldo Fiddler PT, DPT Acute Rehabilitation Services Office [phone]  10/07/22 2:39 PM

## 2022-10-07 NOTE — TOC Progression Note (Signed)
Transition of Care [facility]) - Progression Note    Patient Details  Name: David [name] MRN: [id number] Date of Birth: Jun 28, 1960  Transition of Care [facility]) CM/SW Contact  Baldemar Lenis, [place] Phone Number: 10/07/2022, 11:00 AM  Clinical Narrative:   CSW received a call from patient indicating that someone met with him and told him that he was not going to qualify for disability, and he had a lot of questions about it. CSW unsure who met with the patient, asked if financial counseling met with patient and could answer his questions, awaiting response. CSW unable to complete LOG without pending disability application. CSW to follow.    Expected Discharge Plan: Skilled Nursing Facility Barriers to Discharge: Inadequate or no insurance, SNF Pending bed offer, SNF Pending payor source - LOG  Expected Discharge Plan and Services   Discharge Planning Services: CM Consult Post Acute Care Choice: Home Health Living arrangements for the past 2 months: Hotel/Motel                                       Social Determinants of Health (SDOH) Interventions SDOH Screenings   Food Insecurity: Patient Unable To Answer (08/23/2022)  Housing: Patient Unable To Answer (08/23/2022)  Transportation Needs: Patient Unable To Answer (08/23/2022)  Utilities: Patient Unable To Answer (08/23/2022)  Depression (PHQ2-9): Low Risk  (11/12/2021)  Stress: Stress Concern Present (09/26/2017)  Tobacco Use: Medium Risk (09/25/2022)    Readmission Risk Interventions    09/06/2021   12:07 PM  Readmission Risk Prevention Plan  Post Dischage Appt Complete  Medication Screening Complete  Transportation Screening Complete

## 2022-10-08 DIAGNOSIS — D62 Acute posthemorrhagic anemia: Secondary | ICD-10-CM | POA: Diagnosis not present

## 2022-10-08 DIAGNOSIS — E162 Hypoglycemia, unspecified: Secondary | ICD-10-CM | POA: Diagnosis not present

## 2022-10-08 DIAGNOSIS — E876 Hypokalemia: Secondary | ICD-10-CM | POA: Diagnosis not present

## 2022-10-08 DIAGNOSIS — E119 Type 2 diabetes mellitus without complications: Secondary | ICD-10-CM | POA: Diagnosis not present

## 2022-10-08 LAB — GLUCOSE, CAPILLARY
Glucose-Capillary: 159 mg/dL — ABNORMAL HIGH (ref 70–99)
Glucose-Capillary: 195 mg/dL — ABNORMAL HIGH (ref 70–99)
Glucose-Capillary: 161 mg/dL — ABNORMAL HIGH (ref 70–99)
Glucose-Capillary: 157 mg/dL — ABNORMAL HIGH (ref 70–99)

## 2022-10-08 LAB — HEMOGLOBIN A1C
Hgb A1c MFr Bld: 5.5 % (ref 4.8–5.6)
Mean Plasma Glucose: 111.15 mg/dL

## 2022-10-08 MED ORDER — URELLE 81 MG PO TABS
1.0000 | ORAL_TABLET | Freq: Four times a day (QID) | ORAL | Status: AC
  Administered 2022-10-08 – 2022-10-11 (×11): 81 mg via ORAL
  Filled 2022-10-08 (×13): qty 1

## 2022-10-08 NOTE — Progress Notes (Signed)
Pt stated to this writer "I can get up to use the urinal without help; I do not need the bed alarm to get someone to help me.  I just want to preserve my dignity.  Please communicate to everyone that I do not want the use of the bed alarm. Stop turning it on.  I am not going to fall.  I have been working with PT/OT, they have shown me what to do, and that is what I am doing."

## 2022-10-08 NOTE — Plan of Care (Signed)

## 2022-10-08 NOTE — Progress Notes (Signed)
[name]                                                                              David [name], is a 62 y.o. male, DOB - 1960-08-23, ZOX:[id number] Admit date - 08/20/2022    Outpatient Primary MD for the patient is [name], MD  LOS - 46  days  Chief Complaint  Patient presents with   Hypoglycemia       Brief summary  David [name] is a 62 y.o. 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 as needed, Dilaudid IV as needed --Continue duloxetine, acetaminophen and Celebrex -Feels better now, pain is controlled     Chronic systolic CHF (congestive heart failure)  (HCC) -EF 35-40% in 2020, now resolved to normal -Compensated, appears euvolemic.  - Continue Entresto, digoxin, Coreg, Lasix   Essential hypertension -BP stable - Continue Coreg, furosemide, Entresto, clonidine   Acute urinary retention - Failed voiding trial and Foley was replaced on 6/25 - Continue Flomax and finasteride -Foley catheter removed on 7/3, voiding successfully -Placed on urelle x 3 days for bladder spasms    Persistent atrial fibrillation (HCC) -Rate controlled, continue Coreg, digoxin - Continue Eliquis    Uncontrolled type 2 diabetes with hyperglycemia and hypoglycemia, insulin long term use CBG (last 3)  -Hemoglobin A1c 9.1 on 07/13/2022, will repeat CBG (last 3)  Recent Labs    10/07/22 2114 10/08/22 0649 10/08/22 1159  GLUCAP 178* 159* 195*  -Continue Semglee 20 units daily -Added sliding scale insulin sensitive, 3 times daily AC, at bedtime    Anxiety - Continue Buspar   Pleural effusion Stable, asymptomatic. - Repeat CXR in 4-6 weeks, mid July   Acute postoperative anemia due to expected blood loss -H&H stable   Acute toxic encephalopathy At baseline has no cognitive impairment.  Developed encephalopathy (poorly responsive, confused responses) here due to high doses of opiates.  -Resolved, alert and oriented, at baseline mental status   Hypokalemia Resolved   AKI (acute kidney injury), ruled out  PVD (peripheral vascular disease) (HCC) S/p angioplasty in April - Continue atorvastatin, apixaban   Hypomagnesemia Resolved   Hyponatremia Mild, asymptomatic -Improving   Pure hypercholesterolemia - Continue Lipitor   Estimated body mass index is 22.84 kg/m as calculated from the following:   Height as of this encounter: 6' (1.829 m).   Weight as of this encounter: 76.4 kg.  Code Status: Full code DVT Prophylaxis:  SCD's Start: 08/30/22 1939 apixaban (ELIQUIS) tablet 5 mg   Level of Care: Level of care: Med-Surg Family  Communication:  Disposition Plan:      Remains inpatient appropriate: Awaiting placement, currently no acute issues   Procedures:   Antimicrobials:   Anti-infectives (From admission, onward)    Start     Dose/Rate Route Frequency Ordered Stop   10/08/22 1400  Urelle (URELLE/URISED) 81 MG tablet 81 mg        1 tablet Oral 4 times daily 10/08/22 1140 10/11/22 1359   09/28/22 1000  doxycycline (VIBRA-TABS) tablet 100 mg        100 mg Oral Every 12 hours 09/01/22 1113     08/31/22 2000  DAPTOmycin (CUBICIN) 650 mg in sodium chloride 0.9 % IVPB        8 mg/kg  83.3 kg 126 mL/hr over 30 Minutes Intravenous Daily 08/31/22 1053 09/27/22 1432   08/30/22 2300  vancomycin (VANCOREADY) IVPB 750 mg/150 mL  Status:  Discontinued        750 mg 150 mL/hr over 60 Minutes Intravenous Every 12 hours 08/30/22 1150 08/31/22 1053   08/30/22 1712  vancomycin (VANCOCIN) powder  Status:  Discontinued          As needed 08/30/22 1714 08/30/22 1756   08/30/22 1142  ceFAZolin (ANCEF) 2-4 GM/100ML-% IVPB  Status:  Discontinued       Note to Pharmacy: [facility], [place]: cabinet override      08/30/22 1142 08/30/22 1300   08/30/22 0600  ceFAZolin (ANCEF) IVPB 2g/100 mL premix        2 g 200 mL/hr over 30 Minutes Intravenous On call to O.R. 08/29/22 1848 08/30/22 1401   08/27/22 1041  vancomycin (VANCOCIN) IVPB 1000 mg/200 mL premix  Status:  Discontinued        1,000 mg 200 mL/hr over 60 Minutes Intravenous Every 12 hours 08/27/22 0802 08/30/22 1150   08/26/22 2300  vancomycin (VANCOREADY) IVPB 1250 mg/250 mL  Status:  Discontinued        1,250 mg 166.7 mL/hr over 90 Minutes Intravenous Every 24 hours 08/26/22 1014 08/27/22 0802   08/23/22 2200  vancomycin (VANCOREADY) IVPB 750 mg/150 mL  Status:  Discontinued        750 mg 150 mL/hr over 60 Minutes Intravenous Every 12 hours 08/23/22 0938 08/26/22 1014   08/23/22 1030  vancomycin (VANCOREADY) IVPB 1500 mg/300 mL        1,500 mg 150 mL/hr over 120 Minutes  Intravenous  Once 08/23/22 0938 08/23/22 1312   08/20/22 2200  doxycycline (VIBRA-TABS) tablet 100 mg  Status:  Discontinued        100 mg Oral 2 times daily 08/20/22 1737 08/23/22 0927          Medications  acetaminophen  1,000 mg Oral TID   apixaban  5 mg Oral BID   atorvastatin  80 mg Oral Daily   busPIRone  15 mg Oral BID   carvedilol  3.125 mg Oral BID   celecoxib  100 mg Oral BID   Chlorhexidine Gluconate Cloth  6 each Topical Daily   digoxin  125 mcg Oral Daily   doxycycline  100 mg Oral Q12H   DULoxetine  20 mg Oral Daily   famotidine  20 mg Oral BID   feeding supplement  237 mL Oral BID BM   ferrous sulfate  325 mg Oral QODAY   finasteride  5 mg Oral Daily   fluticasone  1 spray Each Nare Daily   furosemide  20 mg Oral Daily   insulin aspart  0-5 Units Subcutaneous QHS   insulin aspart  0-9 Units Subcutaneous TID WC   insulin glargine-yfgn  20 Units Subcutaneous Daily   lidocaine  1 patch Transdermal Q24H   lidocaine  20 mL Intradermal Once   magnesium oxide  400 mg Oral BID   pantoprazole  40 mg Oral Daily   polyethylene glycol  17 g Oral BID   pregabalin  75 mg Oral QHS   sacubitril-valsartan  1 tablet Oral BID   senna-docusate  1 tablet Oral BID   sodium chloride flush  10-40 mL Intracatheter Q12H   sodium chloride flush  3 mL Intravenous Q12H   tamsulosin  0.4 mg Oral QPC supper   Urelle  1 tablet Oral QID      Subjective:   David [name] was seen and examined today.  Pain is controlled however having some bladder spasms with urination.  No fevers or chills, no dysuria no hematuria.    Objective:   Vitals:   10/07/22 2337 10/08/22 0330 10/08/22 0906 10/08/22 1200  BP: 127/84 122/83 129/83 (!) 135/90  Pulse: 75 66 75 78  Resp: 18 18 19 18   Temp: 98.7 F (37.1 C) 98.5 F (36.9 C) 98 F (36.7 C) 98.5 F (36.9 C)  TempSrc: Oral Oral Oral Oral  SpO2: 97% 98% 99% 98%  Weight:      Height:        Intake/Output Summary (Last 24 hours) at 10/08/2022  1430 Last data filed at 10/07/2022 2336 Gross per 24 hour  Intake 13 ml  Output 300 ml  Net -287 ml     Wt Readings from Last 3 Encounters:  10/05/22 76.4 kg  08/17/22 79.4 kg  07/09/22 81.6 kg    Physical Exam General: Alert and oriented x 3, NAD Cardiovascular: S1 S2 clear, RRR.  Respiratory: CTAB, no wheezing, rales or rhonchi Gastrointestinal: Soft, nontender, nondistended, NBS Ext: no pedal edema bilaterally Neuro: no new deficits Psych: Normal affect    Data Reviewed:  I have personally reviewed following labs    CBC Lab Results  Component Value Date   WBC 5.6 10/07/2022   RBC 4.30 10/07/2022   HGB 12.6 (L) 10/07/2022   HCT 39.1 10/07/2022   MCV 90.9 10/07/2022   MCH 29.3 10/07/2022   PLT 237 10/07/2022   MCHC 32.2 10/07/2022   RDW 18.5 (H) 10/07/2022   LYMPHSABS 1.1 10/03/2022   MONOABS 1.0 10/03/2022   EOSABS 0.4 10/03/2022   BASOSABS 0.1 10/03/2022     Last metabolic panel Lab Results  Component Value Date   NA 133 (L) 10/07/2022   K 4.6 10/07/2022   CL 100 10/07/2022   CO2 27 10/07/2022   BUN 16 10/07/2022   CREATININE 1.28 (H) 10/07/2022   GLUCOSE 144 (H) 10/07/2022   GFRNONAA >60 10/07/2022   GFRAA >60 08/30/2019   CALCIUM 9.8 10/07/2022   PHOS 4.0 08/31/2022   PROT 6.8 09/29/2022   ALBUMIN 2.8 (L) 09/29/2022   LABGLOB 2.4 01/08/2021  AGRATIO 1.7 01/08/2021   BILITOT 0.7 09/29/2022   ALKPHOS 96 09/29/2022   AST 16 09/29/2022   ALT 14 09/29/2022   ANIONGAP 6 10/07/2022    CBG (last 3)  Recent Labs    10/07/22 2114 10/08/22 0649 10/08/22 1159  GLUCAP 178* 159* 195*      Coagulation Profile: No results for input(s): "INR", "PROTIME" in the last 168 hours.   Radiology Studies: I have personally reviewed the imaging studies  No results found.     Thad Ranger M.D. [name] 10/08/2022, 2:30 PM  Available via Epic secure chat 7am-7pm After 7 pm, please refer to night coverage provider listed on amion.

## 2022-10-08 NOTE — Progress Notes (Signed)
PT Cancellation Note  Patient Details Name: David [name] MRN: [id number] DOB: July 28, 1960   Cancelled Treatment:    Reason Eval/Treat Not Completed: Other (comment) pt reports he prefers for PT sessions to occur earlier in the day (from 12-2PM) and that he took too strong of pain medication recently to participate at this time. Politely asking PT to return another day for continued mobility progression.   Vickki Muff, PT, DPT   Acute Rehabilitation Department Office [phone] Secure Chat Communication Preferred   Ronnie Derby 10/08/2022, 2:25 PM

## 2022-10-09 DIAGNOSIS — E162 Hypoglycemia, unspecified: Secondary | ICD-10-CM | POA: Diagnosis not present

## 2022-10-09 DIAGNOSIS — I10 Essential (primary) hypertension: Secondary | ICD-10-CM | POA: Diagnosis not present

## 2022-10-09 DIAGNOSIS — M4644 Discitis, unspecified, thoracic region: Secondary | ICD-10-CM | POA: Diagnosis not present

## 2022-10-09 DIAGNOSIS — E876 Hypokalemia: Secondary | ICD-10-CM | POA: Diagnosis not present

## 2022-10-09 LAB — GLUCOSE, CAPILLARY
Glucose-Capillary: 147 mg/dL — ABNORMAL HIGH (ref 70–99)
Glucose-Capillary: 203 mg/dL — ABNORMAL HIGH (ref 70–99)
Glucose-Capillary: 206 mg/dL — ABNORMAL HIGH (ref 70–99)
Glucose-Capillary: 165 mg/dL — ABNORMAL HIGH (ref 70–99)

## 2022-10-09 MED ORDER — INSULIN ASPART 100 UNIT/ML IJ SOLN
3.0000 [IU] | Freq: Three times a day (TID) | INTRAMUSCULAR | Status: DC
  Administered 2022-10-09 – 2022-10-13 (×10): 3 [IU] via SUBCUTANEOUS

## 2022-10-09 NOTE — Progress Notes (Signed)
[name]                                                                              David [name], is a 62 y.o. male, DOB - 11-03-1960, UJW:[id number] Admit date - 08/20/2022    Outpatient Primary MD for the patient is David Register, MD  LOS - 46  days  Chief Complaint  Patient presents with   Hypoglycemia       Brief summary  David [name] is a 62 y.o. 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 as needed, Dilaudid IV as needed --Continue duloxetine, acetaminophen and Celebrex -Feels better now, pain is controlled     Chronic systolic CHF (congestive heart failure)  (HCC) -EF 35-40% in 2020, now resolved to normal -Compensated, appears euvolemic.  - Continue Entresto, digoxin, Coreg, Lasix   Essential hypertension -BP stable - Continue Coreg, furosemide, Entresto, clonidine   Acute urinary retention - Failed voiding trial and Foley was replaced on 6/25 - Continue Flomax and finasteride -Foley catheter removed on 7/3, voiding successfully -Placed on urelle x 3 days, per patient, bladder spasm improving   Persistent atrial fibrillation (HCC) -Rate controlled, continue Coreg, digoxin - Continue Eliquis    Uncontrolled type 2 diabetes with hyperglycemia and hypoglycemia, insulin long term use CBG (last 3)  -Hemoglobin A1c 5.5  CBG (last 3)  Recent Labs    10/08/22 2149 10/09/22 0612 10/09/22 1130  GLUCAP 157* 147* 203*   CBGs uncontrolled -Continue Semglee 20 units daily, added NovoLog 3 units 3 times daily AC -SSI sensitive    Anxiety - Continue Buspar   Pleural effusion Stable, asymptomatic. - Repeat CXR in 4-6 weeks, mid July   Acute postoperative anemia due to expected blood loss -H&H stable   Acute toxic encephalopathy At baseline has no cognitive impairment.  Developed encephalopathy (poorly responsive, confused responses) here due to high doses of opiates.  -Resolved, alert and oriented, at baseline mental status   Hypokalemia Resolved   AKI (acute kidney injury), ruled out  PVD (peripheral vascular disease) (HCC) S/p angioplasty in April - Continue atorvastatin, apixaban   Hypomagnesemia Resolved   Hyponatremia Mild, asymptomatic -Improving   Pure hypercholesterolemia - Continue Lipitor   Estimated body mass index is 22.84 kg/m as calculated from the following:   Height as of this encounter: 6' (1.829 m).   Weight as of this encounter: 76.4 kg.  Code Status: Full code DVT Prophylaxis:  SCD's Start: 08/30/22 1939 apixaban (ELIQUIS) tablet 5 mg   Level of Care: Level of care: Med-Surg Family  Communication:  Disposition Plan:      Remains inpatient appropriate: Awaiting placement, currently no acute issues   Procedures:   Antimicrobials:   Anti-infectives (From admission, onward)    Start     Dose/Rate Route Frequency Ordered Stop   10/08/22 1400  Urelle (URELLE/URISED) 81 MG tablet 81 mg        1 tablet Oral 4 times daily 10/08/22 1140 10/11/22 1359   09/28/22 1000  doxycycline (VIBRA-TABS) tablet 100 mg        100 mg Oral Every 12 hours 09/01/22 1113     08/31/22 2000  DAPTOmycin (CUBICIN) 650 mg in sodium chloride 0.9 % IVPB        8 mg/kg  83.3 kg 126 mL/hr over 30 Minutes Intravenous Daily 08/31/22 1053 09/27/22 1432   08/30/22 2300  vancomycin (VANCOREADY) IVPB 750 mg/150 mL  Status:  Discontinued        750 mg 150 mL/hr over 60 Minutes Intravenous Every 12 hours 08/30/22 1150 08/31/22 1053   08/30/22 1712  vancomycin (VANCOCIN) powder  Status:  Discontinued          As needed 08/30/22 1714 08/30/22 1756   08/30/22 1142  ceFAZolin (ANCEF) 2-4 GM/100ML-% IVPB  Status:  Discontinued       Note to Pharmacy: [facility], [place]: cabinet override      08/30/22 1142 08/30/22 1300   08/30/22 0600  ceFAZolin (ANCEF) IVPB 2g/100 mL premix        2 g 200 mL/hr over 30 Minutes Intravenous On call to O.R. 08/29/22 1848 08/30/22 1401   08/27/22 1041  vancomycin (VANCOCIN) IVPB 1000 mg/200 mL premix  Status:  Discontinued        1,000 mg 200 mL/hr over 60 Minutes Intravenous Every 12 hours 08/27/22 0802 08/30/22 1150   08/26/22 2300  vancomycin (VANCOREADY) IVPB 1250 mg/250 mL  Status:  Discontinued        1,250 mg 166.7 mL/hr over 90 Minutes Intravenous Every 24 hours 08/26/22 1014 08/27/22 0802   08/23/22 2200  vancomycin (VANCOREADY) IVPB 750 mg/150 mL  Status:  Discontinued        750 mg 150 mL/hr over 60 Minutes Intravenous Every 12 hours 08/23/22 0938 08/26/22 1014   08/23/22 1030  vancomycin (VANCOREADY) IVPB 1500 mg/300 mL        1,500 mg 150 mL/hr over 120 Minutes  Intravenous  Once 08/23/22 0938 08/23/22 1312   08/20/22 2200  doxycycline (VIBRA-TABS) tablet 100 mg  Status:  Discontinued        100 mg Oral 2 times daily 08/20/22 1737 08/23/22 0927          Medications  acetaminophen  1,000 mg Oral TID   apixaban  5 mg Oral BID   atorvastatin  80 mg Oral Daily   busPIRone  15 mg Oral BID   carvedilol  3.125 mg Oral BID   celecoxib  100 mg Oral BID   Chlorhexidine Gluconate Cloth  6 each Topical Daily   digoxin  125 mcg Oral Daily   doxycycline  100 mg Oral Q12H   DULoxetine  20 mg Oral Daily   famotidine  20 mg Oral BID   feeding supplement  237 mL Oral BID BM   ferrous sulfate  325 mg Oral QODAY   finasteride  5 mg Oral Daily   fluticasone  1 spray Each Nare Daily   furosemide  20 mg Oral Daily   insulin aspart  0-5 Units Subcutaneous QHS   insulin aspart  0-9 Units Subcutaneous TID WC   insulin glargine-yfgn  20 Units Subcutaneous Daily   lidocaine  1 patch Transdermal Q24H   lidocaine  20 mL Intradermal Once   magnesium oxide  400 mg Oral BID   pantoprazole  40 mg Oral Daily   polyethylene glycol  17 g Oral BID   pregabalin  75 mg Oral QHS   sacubitril-valsartan  1 tablet Oral BID   senna-docusate  1 tablet Oral BID   sodium chloride flush  10-40 mL Intracatheter Q12H   sodium chloride flush  3 mL Intravenous Q12H   tamsulosin  0.4 mg Oral QPC supper   Urelle  1 tablet Oral QID      Subjective:   David [name] was seen and examined today.pain is better, bladder spasms improving with urelle. No fevers, chills, back pain stable    Objective:   Vitals:   10/07/22 2337 10/08/22 0330 10/08/22 0906 10/08/22 1200  BP: 127/84 122/83 129/83 (!) 135/90  Pulse: 75 66 75 78  Resp: 18 18 19 18   Temp: 98.7 F (37.1 C) 98.5 F (36.9 C) 98 F (36.7 C) 98.5 F (36.9 C)  TempSrc: Oral Oral Oral Oral  SpO2: 97% 98% 99% 98%  Weight:      Height:        Intake/Output Summary (Last 24 hours) at 10/08/2022 1430 Last data filed at  10/07/2022 2336 Gross per 24 hour  Intake 13 ml  Output 300 ml  Net -287 ml     Wt Readings from Last 3 Encounters:  10/05/22 76.4 kg  08/17/22 79.4 kg  07/09/22 81.6 kg   Physical Exam General: Alert and oriented x 3, NAD Cardiovascular: S1 S2 clear, RRR.  Respiratory: CTAB Gastrointestinal: Soft, nontender, nondistended, NBS Ext: no pedal edema bilaterally Neuro: no new deficits Psych: Normal affect    Data Reviewed:  I have personally reviewed following labs    CBC Lab Results  Component Value Date   WBC 5.6 10/07/2022   RBC 4.30 10/07/2022   HGB 12.6 (L) 10/07/2022   HCT 39.1 10/07/2022   MCV 90.9 10/07/2022   MCH 29.3 10/07/2022   PLT 237 10/07/2022   MCHC 32.2 10/07/2022   RDW 18.5 (H) 10/07/2022   LYMPHSABS 1.1 10/03/2022   MONOABS 1.0 10/03/2022   EOSABS 0.4 10/03/2022   BASOSABS 0.1 10/03/2022     Last metabolic panel Lab Results  Component Value Date   NA 133 (L) 10/07/2022   K 4.6 10/07/2022   CL 100 10/07/2022   CO2 27 10/07/2022   BUN 16 10/07/2022   CREATININE 1.28 (H) 10/07/2022   GLUCOSE 144 (H) 10/07/2022   GFRNONAA >60 10/07/2022   GFRAA >60 08/30/2019   CALCIUM 9.8 10/07/2022   PHOS 4.0 08/31/2022   PROT 6.8 09/29/2022   ALBUMIN 2.8 (L) 09/29/2022   LABGLOB 2.4 01/08/2021   AGRATIO 1.7 01/08/2021   BILITOT 0.7 09/29/2022   ALKPHOS 96 09/29/2022  AST 16 09/29/2022   ALT 14 09/29/2022   ANIONGAP 6 10/07/2022    CBG (last 3)  Recent Labs    10/07/22 2114 10/08/22 0649 10/08/22 1159  GLUCAP 178* 159* 195*      Coagulation Profile: No results for input(s): "INR", "PROTIME" in the last 168 hours.   Radiology Studies: I have personally reviewed the imaging studies  No results found.     Thad Ranger M.D. [name] 10/08/2022, 2:30 PM  Available via Epic secure chat 7am-7pm After 7 pm, please refer to night coverage provider listed on amion.

## 2022-10-09 NOTE — Progress Notes (Signed)
Patient refused to have bed alarm on, education was provided, patient continues to refuse.

## 2022-10-10 DIAGNOSIS — E119 Type 2 diabetes mellitus without complications: Secondary | ICD-10-CM | POA: Diagnosis not present

## 2022-10-10 DIAGNOSIS — E162 Hypoglycemia, unspecified: Secondary | ICD-10-CM | POA: Diagnosis not present

## 2022-10-10 DIAGNOSIS — E876 Hypokalemia: Secondary | ICD-10-CM | POA: Diagnosis not present

## 2022-10-10 DIAGNOSIS — M4644 Discitis, unspecified, thoracic region: Secondary | ICD-10-CM | POA: Diagnosis not present

## 2022-10-10 LAB — GLUCOSE, CAPILLARY
Glucose-Capillary: 155 mg/dL — ABNORMAL HIGH (ref 70–99)
Glucose-Capillary: 172 mg/dL — ABNORMAL HIGH (ref 70–99)
Glucose-Capillary: 127 mg/dL — ABNORMAL HIGH (ref 70–99)
Glucose-Capillary: 131 mg/dL — ABNORMAL HIGH (ref 70–99)

## 2022-10-10 NOTE — Progress Notes (Signed)
[name]                                                                              David [name], is a 62 y.o. male, DOB - 1960/05/26, ZOX:[id number] Admit date - 08/20/2022    Outpatient Primary MD for the patient is [name], MD  LOS - 46  days  Chief Complaint  Patient presents with   Hypoglycemia       Brief summary  David [name] is a 62 y.o. 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 as needed, Dilaudid IV as needed --Continue duloxetine, acetaminophen and Celebrex -Pain is controlled, discussed again about fentanyl patch, declined fentanyl patch.  Also  explained that he will need to wean off IV Dilaudid prior to discharge.     Chronic systolic CHF (congestive heart failure) (HCC) -EF 35-40% in 2020, now resolved to normal -Compensated, appears euvolemic.  - Continue Entresto, digoxin, Coreg, Lasix   Essential hypertension -BP stable - Continue Coreg, furosemide, Entresto, clonidine   Acute urinary retention - Failed voiding trial and Foley was replaced on 6/25 - Continue Flomax and finasteride -Foley catheter removed on 7/3, voiding successfully -Placed on urelle x 3 days, per patient, bladder spasm improving   Persistent atrial fibrillation (HCC) -Rate controlled, continue Coreg, digoxin - Continue Eliquis    Uncontrolled type 2 diabetes with hyperglycemia and hypoglycemia, insulin long term use CBG (last 3)  -Hemoglobin A1c 5.5  CBG (last 3)  Recent Labs    10/08/22 2149 10/09/22 0612 10/09/22 1130  GLUCAP 157* 147* 203*   CBGs uncontrolled -Continue Semglee 20 units daily, added NovoLog 3 units 3 times daily AC -SSI sensitive    Anxiety - Continue Buspar   Pleural effusion Stable, asymptomatic. - Repeat CXR in 4-6 weeks, mid July   Acute postoperative anemia due to expected blood loss -H&H stable   Acute toxic encephalopathy At baseline has no cognitive impairment.  Developed encephalopathy (poorly responsive, confused responses) here due to high doses of opiates.  -  Currently alert and oriented, appears to be at baseline mental status   Hypokalemia Resolved   AKI (acute kidney injury), ruled out   PVD (peripheral vascular disease) (HCC) S/p angioplasty in April - Continue atorvastatin, apixaban   Hypomagnesemia Resolved   Hyponatremia Mild, asymptomatic -Improving   Pure hypercholesterolemia - Continue Lipitor  Noncompliance -Patient does not want to use the bed alarm and wants it to be turned off.  Discussed in detail with the patient regarding the protocol and fall risk with not  receiving help in timely fashion if he falls and if the alarm is disabled.  Patient states that he is willing to sign any form to state that it is HIS responsibility and burden if he falls.  However he does not want the bed alarm as he wants to feel 'human and keep some independence".     Estimated body mass index is 22.84 kg/m as calculated from the following:   Height as of this encounter: 6' (1.829 m).   Weight as of this encounter: 76.4 kg.  Code Status: Full code DVT Prophylaxis:  SCD's Start: 08/30/22 1939 apixaban (ELIQUIS) tablet 5 mg   Level of Care: Level of care: Med-Surg Family Communication:  Disposition Plan:      Remains inpatient appropriate: Awaiting placement, currently no acute issues   Procedures:   Antimicrobials:   Anti-infectives (From admission, onward)    Start     Dose/Rate Route Frequency Ordered Stop   10/08/22 1400  Urelle (URELLE/URISED) 81 MG tablet 81 mg        1 tablet Oral 4 times daily 10/08/22 1140 10/11/22 1359   09/28/22 1000  doxycycline (VIBRA-TABS) tablet 100 mg        100 mg Oral Every 12 hours 09/01/22 1113     08/31/22 2000  DAPTOmycin (CUBICIN) 650 mg in sodium chloride 0.9 % IVPB        8 mg/kg  83.3 kg 126 mL/hr over 30 Minutes Intravenous Daily 08/31/22 1053 09/27/22 1432   08/30/22 2300  vancomycin (VANCOREADY) IVPB 750 mg/150 mL  Status:  Discontinued        750 mg 150 mL/hr over 60 Minutes Intravenous Every 12 hours 08/30/22 1150 08/31/22 1053   08/30/22 1712  vancomycin (VANCOCIN) powder  Status:  Discontinued          As needed 08/30/22 1714 08/30/22 1756   08/30/22 1142  ceFAZolin (ANCEF) 2-4 GM/100ML-% IVPB  Status:  Discontinued       Note to Pharmacy: [facility], [place]: cabinet override      08/30/22 1142 08/30/22 1300   08/30/22 0600  ceFAZolin (ANCEF) IVPB 2g/100 mL premix        2 g 200 mL/hr over 30 Minutes Intravenous On call to O.R. 08/29/22 1848 08/30/22 1401   08/27/22 1041  vancomycin (VANCOCIN) IVPB 1000 mg/200  mL premix  Status:  Discontinued        1,000 mg 200 mL/hr over 60 Minutes Intravenous Every 12 hours 08/27/22 0802 08/30/22 1150   08/26/22 2300  vancomycin (VANCOREADY) IVPB 1250 mg/250 mL  Status:  Discontinued        1,250 mg 166.7 mL/hr over 90 Minutes Intravenous Every 24 hours 08/26/22 1014 08/27/22 0802   08/23/22 2200  vancomycin (VANCOREADY) IVPB 750 mg/150 mL  Status:  Discontinued        750 mg 150 mL/hr over 60 Minutes Intravenous Every 12 hours 08/23/22 0938 08/26/22 1014   08/23/22 1030  vancomycin (VANCOREADY) IVPB 1500  mg/300 mL        1,500 mg 150 mL/hr over 120 Minutes Intravenous  Once 08/23/22 0938 08/23/22 1312   08/20/22 2200  doxycycline (VIBRA-TABS) tablet 100 mg  Status:  Discontinued        100 mg Oral 2 times daily 08/20/22 1737 08/23/22 0927          Medications  acetaminophen  1,000 mg Oral TID   apixaban  5 mg Oral BID   atorvastatin  80 mg Oral Daily   busPIRone  15 mg Oral BID   carvedilol  3.125 mg Oral BID   celecoxib  100 mg Oral BID   Chlorhexidine Gluconate Cloth  6 each Topical Daily   digoxin  125 mcg Oral Daily   doxycycline  100 mg Oral Q12H   DULoxetine  20 mg Oral Daily   famotidine  20 mg Oral BID   feeding supplement  237 mL Oral BID BM   ferrous sulfate  325 mg Oral QODAY   finasteride  5 mg Oral Daily   fluticasone  1 spray Each Nare Daily   furosemide  20 mg Oral Daily   insulin aspart  0-5 Units Subcutaneous QHS   insulin aspart  0-9 Units Subcutaneous TID WC   insulin glargine-yfgn  20 Units Subcutaneous Daily   lidocaine  1 patch Transdermal Q24H   lidocaine  20 mL Intradermal Once   magnesium oxide  400 mg Oral BID   pantoprazole  40 mg Oral Daily   polyethylene glycol  17 g Oral BID   pregabalin  75 mg Oral QHS   sacubitril-valsartan  1 tablet Oral BID   senna-docusate  1 tablet Oral BID   sodium chloride flush  10-40 mL Intracatheter Q12H   sodium chloride flush  3 mL Intravenous Q12H   tamsulosin  0.4 mg Oral  QPC supper   Urelle  1 tablet Oral QID      Subjective:   David [name] was seen and examined today. States feeling nauseous as he got multiple meds at the same time.  Wants the bed alarm to be turned off.  Pain is controlled.  Objective:   Vitals:   10/07/22 2337 10/08/22 0330 10/08/22 0906 10/08/22 1200  BP: 127/84 122/83 129/83 (!) 135/90  Pulse: 75 66 75 78  Resp: 18 18 19 18   Temp: 98.7 F (37.1 C) 98.5 F (36.9 C) 98 F (36.7 C) 98.5 F (36.9 C)  TempSrc: Oral Oral Oral Oral  SpO2: 97% 98% 99% 98%  Weight:      Height:        Intake/Output Summary (Last 24 hours) at 10/08/2022 1430 Last data filed at 10/07/2022 2336 Gross per 24 hour  Intake 13 ml  Output 300 ml  Net -287 ml     Wt Readings from Last 3 Encounters:  10/05/22 76.4 kg  08/17/22 79.4 kg  07/09/22 81.6 kg    Physical Exam General: Alert and oriented x 3, NAD Cardiovascular: S1 S2 clear, RRR.  Respiratory: CTAB, no wheezing Gastrointestinal: Soft, nontender, nondistended, NBS Ext: no pedal edema bilaterally Neuro: no new deficits Psych: Normal affect   Data Reviewed:  I have personally reviewed following labs    CBC Lab Results  Component Value Date   WBC 5.6 10/07/2022   RBC 4.30 10/07/2022   HGB 12.6 (L) 10/07/2022   HCT 39.1 10/07/2022   MCV 90.9 10/07/2022   MCH 29.3 10/07/2022   PLT 237 10/07/2022   MCHC 32.2  10/07/2022   RDW 18.5 (H) 10/07/2022   LYMPHSABS 1.1 10/03/2022   MONOABS 1.0 10/03/2022   EOSABS 0.4 10/03/2022   BASOSABS 0.1 10/03/2022     Last metabolic panel Lab Results  Component Value Date   NA 133 (L) 10/07/2022   K 4.6 10/07/2022   CL 100 10/07/2022   CO2 27 10/07/2022   BUN 16 10/07/2022   CREATININE 1.28 (H) 10/07/2022   GLUCOSE 144 (H) 10/07/2022   GFRNONAA >60 10/07/2022   GFRAA >60 08/30/2019   CALCIUM 9.8 10/07/2022   PHOS 4.0 08/31/2022   PROT 6.8 09/29/2022   ALBUMIN 2.8 (L) 09/29/2022   LABGLOB 2.4 01/08/2021   AGRATIO 1.7 01/08/2021    BILITOT 0.7 09/29/2022   ALKPHOS 96 09/29/2022   AST 16 09/29/2022   ALT 14 09/29/2022   ANIONGAP 6 10/07/2022    CBG (last 3)  Recent Labs    10/07/22 2114 10/08/22 0649 10/08/22 1159  GLUCAP 178* 159* 195*      Coagulation Profile: No results for input(s): "INR", "PROTIME" in the last 168 hours.   Radiology Studies: I have personally reviewed the imaging studies  No results found.     Thad Ranger M.D. [name] 10/08/2022, 2:30 PM  Available via Epic secure chat 7am-7pm After 7 pm, please refer to night coverage provider listed on amion.

## 2022-10-10 NOTE — Plan of Care (Signed)
  Problem: Education: Goal: Knowledge of General Education information will improve Description: Including pain rating scale, medication(s)/side effects and non-pharmacologic comfort measures Outcome: Progressing   Problem: Clinical Measurements: Goal: Respiratory complications will improve Outcome: Progressing   Problem: Nutrition: Goal: Adequate nutrition will be maintained Outcome: Progressing   Problem: Skin Integrity: Goal: Risk for impaired skin integrity will decrease Outcome: Progressing   Problem: Fluid Volume: Goal: Ability to maintain a balanced intake and output will improve Outcome: Progressing   Problem: Health Behavior/Discharge Planning: Goal: Ability to manage health-related needs will improve Outcome: Not Progressing   Problem: Clinical Measurements: Goal: Will remain free from infection Outcome: Not Progressing   Problem: Coping: Goal: Level of anxiety will decrease Outcome: Not Progressing   Problem: Pain Managment: Goal: General experience of comfort will improve Outcome: Not Progressing

## 2022-10-10 NOTE — Progress Notes (Signed)
Patient noted to be high fall risk upon assessment. His bed alarm was turned was on this morning. Patient very upset about bed alarm being turned on. Patient states that he does not want the alarm on and he would like to be able to get out of bed and use the bedside commode on  his own without staff in his room.  Writer explained to patient that it is unsafe and he is putting himself at risk for injury Patient states he does not care he does not want the alarm on. Writer spoke with Dr. Isidoro Donning and Suzzette Righter, Nurse manager about situation. Patient has been educated by Clinical research associate, MD and management about fall protocol and safety risk of getting up without staff assistance and bed alarm. Patient agrees to accept risk and demands bed alarm to be off.

## 2022-10-10 NOTE — Progress Notes (Signed)
Occupational Therapy Treatment Patient Details Name: David [name] MRN: [id number] DOB: 06/16/1960 Today's Date: 10/10/2022   History of present illness Pt is a 62 y.o. male who presented 08/20/22  with hypoglycemia. 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 5/28 surgical repair: T5-11 posterior/posterolateral arthrodesis  & Transpedicular decompression with partial corpectomy, left T8 & T8-9 laminectomy, bilateral facetectomy. 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 Pt able to manage full UB/LB dressing task and TLSO brace mgmt without physical assist (though min guard provided for safety during standing aspects). Pt able to demo bathroom mobility with RW at min guard though one instance of Min A needed with minor LOB in turning to wheelchair. Educated on wheelchair parts/use w/ pt able to mobilize around hospital and outside when left with PT to start their session. Plan to progress bathroom mobility and ability to safely gather needed items w/o LOB in next sessions.    Recommendations for follow up therapy are one component of a multi-disciplinary discharge planning process, led by the attending physician.  Recommendations may be updated based on patient status, additional functional criteria and insurance authorization.    Assistance Recommended at Discharge Intermittent Supervision/Assistance  Patient can return home with the following  A little help with walking and/or transfers;A little help with bathing/dressing/bathroom;Assistance with cooking/housework;Assist for transportation;Help with  stairs or ramp for entrance   Equipment Recommendations  BSC/3in1;Wheelchair (measurements OT);Wheelchair cushion (measurements OT);Other (comment) (RW)    Recommendations for Other Services Rehab consult    Precautions / Restrictions Precautions Precautions: Fall;Back Required Braces or Orthoses: Spinal Brace Spinal Brace: Thoracolumbosacral orthotic;Applied in sitting position Restrictions Weight Bearing Restrictions: Yes RLE Weight Bearing: Weight bearing as tolerated       Mobility Bed Mobility Overal bed mobility: Modified Independent             General bed mobility comments: On EOB upon entry    Transfers Overall transfer level: Needs assistance Equipment used: Rolling walker (2 wheels) Transfers: Sit to/from Stand Sit to Stand: Min guard           General transfer comment: some assist to steady/secure RW for pt to stand w/ pt later able to demo without this assist     Balance Overall balance assessment: Needs assistance Sitting-balance support: Feet supported, No upper extremity supported Sitting balance-Leahy Scale: Good     Standing balance support: Single extremity supported, Bilateral upper extremity supported, No upper extremity supported, During functional activity Standing balance-Leahy Scale: Fair Standing balance comment: able to stand briefly without UE support for LB ADL                           ADL either performed or assessed with clinical judgement   ADL Overall ADL's : Needs assistance/impaired     Grooming: Modified independent;Sitting;Oral care Grooming Details (indicate cue type and reason): sitting at sink in w/c         Upper Body Dressing : Set up;Sitting Upper Body Dressing Details (indicate cue type and reason): shirt + TLSO brace Lower Body Dressing: Min guard;Sit to/from stand Lower  Body Dressing Details (indicate cue type and reason): min guard for safety when pulling up pants/underwear in standing at  bedside with RW. able to bring feet to self to don underwear, pants and shoes around feet. cues to prop LE up to tie tennis shoes w/ pt able to return demo w/ increased time. Toilet Transfer: Min guard;Minimal assistance;Ambulation;Rolling walker (2 wheels) Toilet Transfer Details (indicate cue type and reason): BSC over toilet to increase ease of height. one minor LOB exiting bathroom with Min A to correct and min guard for all other mobility         Functional mobility during ADLs: Min guard;Minimal assistance;Rolling walker (2 wheels) General ADL Comments: Also collaborated with PT to take pt outside in wheelchair. educated on w/c parts/use with continued reinforcement needed    Extremity/Trunk Assessment Upper Extremity Assessment Upper Extremity Assessment: Overall WFL for tasks assessed   Lower Extremity Assessment Lower Extremity Assessment: Defer to PT evaluation        Vision   Vision Assessment?: No apparent visual deficits   Perception     Praxis      Cognition Arousal/Alertness: Awake/alert Behavior During Therapy: WFL for tasks assessed/performed Overall Cognitive Status: Within Functional Limits for tasks assessed                                          Exercises      Shoulder Instructions       General Comments      Pertinent Vitals/ Pain       Pain Assessment Pain Assessment: Faces Faces Pain Scale: Hurts a little bit Pain Location: back Pain Descriptors / Indicators: Discomfort Pain Intervention(s): Monitored during session  Home Living                                          Prior Functioning/Environment              Frequency  Min 2X/week        Progress Toward Goals  OT Goals(current goals can now be found in the care plan section)  Progress towards OT goals: Progressing toward goals  Acute Rehab OT Goals Patient Stated Goal: be able to walk without assist OT Goal Formulation: With  patient Time For Goal Achievement: 10/24/22 Potential to Achieve Goals: Good  Plan Discharge plan remains appropriate;Frequency remains appropriate    Co-evaluation                 AM-PAC OT "6 Clicks" Daily Activity     Outcome Measure   Help from another person eating meals?: None Help from another person taking care of personal grooming?: A Little Help from another person toileting, which includes using toliet, bedpan, or urinal?: A Little Help from another person bathing (including washing, rinsing, drying)?: A Little Help from another person to put on and taking off regular upper body clothing?: A Little Help from another person to put on and taking off regular lower body clothing?: A Little 6 Click Score: 19    End of Session Equipment Utilized During Treatment: Gait belt;Rolling walker (2 wheels);Back brace  OT Visit Diagnosis: Unsteadiness on feet (R26.81);Other abnormalities of gait and mobility (R26.89);Muscle weakness (generalized) (M62.81);Pain   Activity Tolerance Patient tolerated treatment well   Patient Left Other (comment) (in  w/c with PT)   Nurse Communication          Time: 1610-9604 OT Time Calculation (min): 52 min  Charges: OT General Charges $OT Visit: 1 Visit OT Treatments $Self Care/Home Management : 23-37 mins $Therapeutic Activity: 8-22 mins  Bradd Canary, OTR/L Acute Rehab Services Office: [phone]   Lorre Munroe 10/10/2022, 2:05 PM

## 2022-10-10 NOTE — Progress Notes (Signed)
Patient became upset with RN this morning because he dose not want his bed alarm on. He would like to able to get up and use the bedside commode and urinal alone. Patients states management told him he could go with out the bed alarm. I am not not sure who this person was. Myself (nurse manger) nor Transport planner) told the patient this.I educated the patient on the risk of not having fall preventions in place. Patient verbalized understanding and is still refusing the bed alarm. Refusal documented in the flowsheet by RN Renaldo Harrison).

## 2022-10-10 NOTE — Progress Notes (Signed)
Physical Therapy Treatment Patient Details Name: David [name] MRN: [id number] DOB: [birth year] Today's Date: 10/10/2022   History of Present Illness Pt is a 62 y.o. male who presented 08/20/22  with hypoglycemia. 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 5/28 surgical repair: T5-11 posterior/posterolateral arthrodesis  & Transpedicular decompression with partial corpectomy, left T8 & T8-9 laminectomy, bilateral facetectomy. 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 He was fatigued after but did well with this. He also ambulated outside which was very warm. Pt ambulated on various surfaces with RW as well as navigating a small incline with min A and RW. Ambulated 3 bouts of 60' with seated rest break in between. Pt taught to use w/c and manage various components. He propelled with BUE's for distance then fwd with BLE's and bkwd with BLE's and added resistance for strengthening. Pt also worked on standing balance/ strengthening exercises at countertop in room. Patient will benefit from intensive inpatient follow up therapy, >3 hours/day. PT will continue to follow.      Assistance Recommended at Discharge Frequent or constant Supervision/Assistance  If plan is discharge home, recommend the following:  Can travel by private vehicle    Assist for transportation;Two people to help with walking and/or transfers;A lot of help with bathing/dressing/bathroom;Assistance with cooking/housework;Direct supervision/assist for financial management;Direct supervision/assist for medications  management;Help with stairs or ramp for entrance   No  Equipment Recommendations  Rolling walker (2 wheels);Wheelchair cushion (measurements PT);Wheelchair (measurements PT);BSC/3in1    Recommendations for Other Services Rehab consult     Precautions / Restrictions Precautions Precautions: Fall;Back Precaution Booklet Issued: No Required Braces or Orthoses: Spinal Brace Spinal Brace: Thoracolumbosacral orthotic;Applied in sitting position Other Brace: for use when ambulating. Restrictions Weight Bearing Restrictions: Yes RLE Weight Bearing: Weight bearing as tolerated RLE Partial Weight Bearing Percentage or Pounds: heel weight bearing Other Position/Activity Restrictions: with Darco shoe. Has not been using since amp was in 4/24     Mobility  Bed Mobility               General bed mobility comments: pt up with OT before PT session    Transfers Overall transfer level: Needs assistance Equipment used: Rolling walker (2 wheels) Transfers: Sit to/from Stand Sit to Stand: Min guard           General transfer comment: min guard from w/c, needed a couple of trials to get used to pushing up from w/c arms    Ambulation/Gait Ambulation/Gait assistance: Min assist Gait Distance (Feet): 60 Feet (3x) Assistive device: Rolling walker (2 wheels) Gait Pattern/deviations: Knee hyperextension - right, Knee hyperextension - left, Decreased dorsiflexion - right, Decreased dorsiflexion - left, Drifts right/left, Narrow base of support Gait velocity: reduced Gait velocity interpretation: <1.8 ft/sec, indicate of risk for recurrent falls   General Gait Details: worked on walking outside over various surfaces and small incline. Min A needed for stability, occasional hyperext R and L, vc's for widening BOS at times. Worked on turns and then sidestepping at [employer]  Mobility Wheelchair Mobility Wheelchair mobility: Yes Wheelchair propulsion:  Both upper extremities, Both lower extermities Wheelchair parts: Supervision/cueing Distance: 200 Wheelchair Assistance Details (indicate cue type and reason): pt taught to manage components of w/c, able to mobilize with UE"s but also worked on pulling self fwd with LE's without UE's and pushing self bkwds for LE strengthening   Tilt Bed    Modified Rankin (Stroke Patients Only)       Balance Overall balance assessment: Needs assistance Sitting-balance support: Feet supported, No upper extremity supported Sitting balance-Leahy Scale: Good     Standing balance support: Single extremity supported, Bilateral upper extremity supported, No upper extremity supported, During functional activity Standing balance-Leahy Scale: Fair Standing balance comment: able to stand briefly without UE support for shaving at sink               High Level Balance Comments: sidestepping R and L            Cognition Arousal/Alertness: Awake/alert Behavior During Therapy: WFL for tasks assessed/performed Overall Cognitive Status: Within Functional Limits for tasks assessed                                          Exercises      General Comments General comments (skin integrity, edema, etc.): VSS, pt very fatigued after OT followed by PT session but tolerated well      Pertinent Vitals/Pain Pain Assessment Pain Assessment: Faces Faces Pain Scale: Hurts a little bit Pain Location: back Pain Descriptors / Indicators: Discomfort Pain Intervention(s): Monitored during session, Limited activity within patient's tolerance    Home Living                          Prior Function            PT Goals (current goals can now be found in the care plan section) Acute Rehab PT Goals Patient Stated Goal: return home and drum again PT Goal Formulation: With patient Time For Goal Achievement: 10/12/22 Potential to Achieve Goals: Good Progress towards PT goals:  Progressing toward goals    Frequency    Min 5X/week      PT Plan Current plan remains appropriate    Co-evaluation PT/OT/SLP Co-Evaluation/Treatment: Yes            AM-PAC PT "6 Clicks" Mobility   Outcome Measure  Help needed turning from your back to your side while in a flat bed without using bedrails?: A Little Help needed moving from lying on your back to sitting on the side of a flat bed without using bedrails?: A Little Help needed moving to and from a bed to a chair (including a wheelchair)?: A Little Help needed standing up from a chair using your arms (e.g., wheelchair or bedside chair)?: A Little Help needed to walk in hospital room?: A Little Help needed climbing 3-5 steps with a railing? : Total 6 Click Score: 16    End of Session Equipment Utilized During Treatment: Back brace;Gait belt Activity Tolerance: Patient tolerated treatment well Patient left: in chair;with call bell/phone within reach Nurse Communication: Mobility status PT Visit Diagnosis: Difficulty in walking, not elsewhere classified (R26.2);Other abnormalities of gait and mobility (R26.89);Muscle weakness (generalized) (M62.81);Pain;Unsteadiness on feet (R26.81) Pain - Right/Left: Left Pain - part of body: Leg     Time: 4098-1191 PT Time Calculation (min) (ACUTE  ONLY): 36 min  Charges:    $Gait Training: 8-22 mins $Therapeutic Exercise: 8-22 mins PT General Charges $$ ACUTE PT VISIT: 1 Visit                     Lyanne Co, PT  Acute Rehab Services Secure chat preferred Office [phone]    Lawana Chambers Arran Fessel 10/10/2022, 3:45 PM

## 2022-10-11 DIAGNOSIS — E119 Type 2 diabetes mellitus without complications: Secondary | ICD-10-CM | POA: Diagnosis not present

## 2022-10-11 DIAGNOSIS — M4644 Discitis, unspecified, thoracic region: Secondary | ICD-10-CM | POA: Diagnosis not present

## 2022-10-11 DIAGNOSIS — E876 Hypokalemia: Secondary | ICD-10-CM | POA: Diagnosis not present

## 2022-10-11 DIAGNOSIS — E162 Hypoglycemia, unspecified: Secondary | ICD-10-CM | POA: Diagnosis not present

## 2022-10-11 LAB — GLUCOSE, CAPILLARY
Glucose-Capillary: 144 mg/dL — ABNORMAL HIGH (ref 70–99)
Glucose-Capillary: 142 mg/dL — ABNORMAL HIGH (ref 70–99)
Glucose-Capillary: 134 mg/dL — ABNORMAL HIGH (ref 70–99)
Glucose-Capillary: 66 mg/dL — ABNORMAL LOW (ref 70–99)
Glucose-Capillary: 72 mg/dL (ref 70–99)

## 2022-10-11 NOTE — Plan of Care (Signed)
  Problem: Education: Goal: Knowledge of General Education information will improve Description: Including pain rating scale, medication(s)/side effects and non-pharmacologic comfort measures Outcome: Progressing   Problem: Clinical Measurements: Goal: Respiratory complications will improve Outcome: Progressing   Problem: Nutrition: Goal: Adequate nutrition will be maintained Outcome: Progressing   Problem: Elimination: Goal: Will not experience complications related to bowel motility Outcome: Progressing Goal: Will not experience complications related to urinary retention Outcome: Progressing   Problem: Activity: Goal: Ability to return to baseline activity level will improve Outcome: Not Progressing   Problem: Health Behavior/Discharge Planning: Goal: Ability to manage health-related needs will improve Outcome: Not Progressing   Problem: Coping: Goal: Level of anxiety will decrease Outcome: Not Progressing   Problem: Pain Managment: Goal: General experience of comfort will improve Outcome: Not Progressing

## 2022-10-11 NOTE — Progress Notes (Signed)
    Durable Medical Equipment  (From admission, onward)           Start     Ordered   10/11/22 1610  For home use only DME standard manual wheelchair with seat cushion  Once       Comments: Patient suffers from weakness which impairs their ability to perform daily activities like bathing, dressing, and grooming in the home.  A walker will not resolve issue with performing activities of daily living. A wheelchair will allow patient to safely perform daily activities. Patient can safely propel the wheelchair in the home or has a caregiver who can provide assistance. Length of need Lifetime. Accessories: elevating leg rests (ELRs), wheel locks, extensions and anti-tippers.   10/11/22 1609

## 2022-10-11 NOTE — Progress Notes (Signed)
1952 pt's cbg was 66. 4oz apple juice given and cbg 72 at 2006. David [name] [name]

## 2022-10-11 NOTE — Progress Notes (Signed)
Physical Therapy Treatment Patient Details Name: David [name] MRN: [id number] DOB: May 14, 1960 Today's Date: 10/11/2022   History of Present Illness Pt is a 62 y.o. male who presented 08/20/22  with hypoglycemia. 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 5/28 surgical repair: T5-11 posterior/posterolateral arthrodesis  & Transpedicular decompression with partial corpectomy, left T8 & T8-9 laminectomy, bilateral facetectomy. 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  Pt requires brace and close guard of chair due to combination of weakness, sensory changes of his injury and back precautions.  Focused on balance and posture, had a good session with discussion of his plan moving forward.  Pt is in continual pain to some degree, positioned as best he could be and encouraged pt to have nursing assist him with any needs that occurred, not to get to bed alone.     Assistance Recommended at Discharge Frequent or constant Supervision/Assistance  If plan is discharge home, recommend the following:  Can travel by private vehicle    Assist for transportation;Two people to help with walking and/or transfers;A lot of help with bathing/dressing/bathroom;Assistance with cooking/housework;Direct supervision/assist for financial management;Direct supervision/assist for medications management;Help with stairs or ramp for entrance   No  Equipment Recommendations  Rolling walker (2 wheels);Wheelchair cushion (measurements PT);Wheelchair (measurements PT);BSC/3in1     Recommendations for Other Services Rehab consult     Precautions / Restrictions Precautions Precautions: Fall;Back Precaution Comments: JP drain Required Braces or Orthoses: Spinal Brace Spinal Brace: Thoracolumbosacral orthotic;Applied in sitting position Other Brace: for use when ambulating. Restrictions Weight Bearing Restrictions: Yes RLE Weight Bearing: Weight bearing as tolerated RLE Partial Weight Bearing Percentage or Pounds: heel weight bearing Other Position/Activity Restrictions: with Darco shoe. Has not been using since amp was in 4/24     Mobility  Bed Mobility Overal bed mobility: Modified Independent Bed Mobility: Supine to Sit                Transfers Overall transfer level: Needs assistance Equipment used: Rolling walker (2 wheels) Transfers: Sit to/from Stand Sit to Stand: Min guard           General transfer comment: cues for safety of sitting as pt is a bit  impulsive with sitting before reaching    Ambulation/Gait Ambulation/Gait assistance: Min assist Gait Distance (Feet): 190 Feet (in three trips) Assistive device: Rolling walker (2 wheels) Gait Pattern/deviations: Knee hyperextension - right, Knee hyperextension - left, Decreased dorsiflexion - right, Decreased dorsiflexion - left, Drifts right/left, Narrow base of support Gait velocity: reduced Gait velocity interpretation: <1.31 ft/sec, indicative of household ambulator   General Gait Details: worked with pt to become aware of his hyperextension and control it, which he could about 80% of the time   Comptroller Bed    Modified Rankin (Stroke Patients Only)       Balance  Cognition Arousal/Alertness: Awake/alert Behavior During Therapy: WFL for tasks assessed/performed Overall Cognitive Status: Within Functional Limits for tasks assessed                                  General Comments: pt is initially a bit anxious and then calmer        Exercises General Exercises - Lower Extremity Ankle Circles/Pumps: AROM, 10 reps Quad Sets: AROM, 10 reps Gluteal Sets: AROM, 10 reps Hip ABduction/ADduction: AROM, 10 reps    General Comments General comments (skin integrity, edema, etc.): Pt was assisted to get up to stand and walk with control of knees, had assist to keep the recliner close and maintain safety of standing balance      Pertinent Vitals/Pain Pain Assessment Pain Assessment: Faces Faces Pain Scale: Hurts a little bit Pain Location: back Pain Descriptors / Indicators: Discomfort, Guarding Pain Intervention(s): Limited activity within patient's tolerance, Monitored during session, Premedicated before session, Repositioned    Home Living                          Prior Function            PT Goals (current goals can now be found in the care plan section) Acute Rehab PT Goals Patient Stated Goal: return home and drum again Progress towards PT goals: Progressing toward goals    Frequency    Min 5X/week      PT Plan Current plan remains appropriate    Co-evaluation              AM-PAC PT "6 Clicks" Mobility   Outcome Measure  Help needed turning from your back to your side while in a flat bed without using bedrails?: A Little Help needed moving from lying on your back to sitting on the side of a flat bed without using bedrails?: A Little Help needed moving to and from a bed to a chair (including a wheelchair)?: A Little Help needed standing up from a chair using your arms (e.g., wheelchair or bedside chair)?: A Little Help needed to walk in hospital room?: A Little Help needed climbing 3-5 steps with a railing? : Total 6 Click Score: 16    End of Session Equipment Utilized During Treatment: Back brace;Gait belt Activity Tolerance: Patient tolerated treatment well Patient left: in chair;with  call bell/phone within reach Nurse Communication: Mobility status PT Visit Diagnosis: Difficulty in walking, not elsewhere classified (R26.2);Other abnormalities of gait and mobility (R26.89);Muscle weakness (generalized) (M62.81);Pain;Unsteadiness on feet (R26.81) Pain - Right/Left: Left Pain - part of body: Leg     Time: 2956-2130 PT Time Calculation (min) (ACUTE ONLY): 31 min  Charges:    $Gait Training: 8-22 mins $Therapeutic Exercise: 8-22 mins PT General Charges $$ ACUTE PT VISIT: 1 Visit            Ivar Drape 10/11/2022, 6:49 PM  Samul Dada, PT PhD Acute Rehab Dept. Number: [facility] [id number] and [facility] [phone]

## 2022-10-11 NOTE — Progress Notes (Signed)
[name]                                                                              David [name], is a 62 y.o. male, DOB - 08/20/1960, UJW:[id number] Admit date - 08/20/2022    Outpatient Primary MD for the patient is [name], MD  LOS - 49  days  Chief Complaint  Patient presents with   Hypoglycemia       Brief summary  David [name] is a 62 y.o. 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 as needed, Dilaudid IV as needed --Continue duloxetine, acetaminophen and Celebrex -Pain is controlled, discussed again about fentanyl  patch, declined fentanyl patch.  Also explained that he will need to wean off IV Dilaudid prior to discharge. -No acute issues     Chronic systolic CHF (congestive heart failure) (HCC) -EF 35-40% in 2020, now resolved to normal -Compensated, appears euvolemic.  - Continue Entresto, digoxin, Coreg, Lasix   Essential hypertension -BP stable - Continue Coreg, furosemide, Entresto, clonidine   Acute urinary retention - Failed voiding trial and Foley was replaced on 6/25 - Continue Flomax and finasteride -Foley catheter removed on 7/3, voiding successfully -Placed on urelle x 3 days, bladder spasms improving   Persistent atrial fibrillation (HCC) -Rate controlled, continue Coreg, digoxin - Continue Eliquis    Uncontrolled type 2 diabetes with hyperglycemia and hypoglycemia, insulin long term use CBG (last 3)  -Hemoglobin A1c 5.5  CBG (last 3)  Recent Labs    10/10/22 2017 10/11/22 0522 10/11/22 1238  GLUCAP 131* 144* 142*   CBGs uncontrolled -Continue Semglee 20 units daily, added NovoLog 3 units 3 times daily AC -SSI sensitive    Anxiety - Continue Buspar   Pleural effusion Stable, asymptomatic. - Repeat CXR in 4-6 weeks, mid July   Acute postoperative anemia due to expected blood loss -H&H stable   Acute toxic encephalopathy At baseline has no cognitive impairment.  Developed encephalopathy (poorly responsive, confused responses) here due  to high doses of opiates.  -Currently alert and oriented, appears to be at baseline mental status   Hypokalemia Resolved   AKI (acute kidney injury), ruled out   PVD (peripheral vascular disease) (HCC) S/p angioplasty in April - Continue atorvastatin, apixaban   Hypomagnesemia Resolved   Hyponatremia Mild, asymptomatic -Improving   Pure hypercholesterolemia - Continue Lipitor  Noncompliance -Patient wants a bed alarm to be turned off. Discussed on 7/8 with the patient regarding the protocol and fall risk with  not receiving help in timely fashion if he falls and if the alarm is disabled.  Patient states that he is willing to sign any form to state that it is HIS responsibility and burden if he falls.  However he does not want the bed alarm as he wants to feel 'human and keep some independence".     Estimated body mass index is 22.84 kg/m as calculated from the following:   Height as of this encounter: 6' (1.829 m).   Weight as of this encounter: 76.4 kg.  Code Status: Full code DVT Prophylaxis:  SCD's Start: 08/30/22 1939 apixaban (ELIQUIS) tablet 5 mg   Level of Care: Level of care: Med-Surg Family Communication:  Disposition Plan:      Remains inpatient appropriate: Awaiting placement, currently no acute issues   Procedures:   Antimicrobials:   Anti-infectives (From admission, onward)    Start     Dose/Rate Route Frequency Ordered Stop   10/08/22 1400  Urelle (URELLE/URISED) 81 MG tablet 81 mg        1 tablet Oral 4 times daily 10/08/22 1140 10/11/22 1359   09/28/22 1000  doxycycline (VIBRA-TABS) tablet 100 mg        100 mg Oral Every 12 hours 09/01/22 1113     08/31/22 2000  DAPTOmycin (CUBICIN) 650 mg in sodium chloride 0.9 % IVPB        8 mg/kg  83.3 kg 126 mL/hr over 30 Minutes Intravenous Daily 08/31/22 1053 09/27/22 1432   08/30/22 2300  vancomycin (VANCOREADY) IVPB 750 mg/150 mL  Status:  Discontinued        750 mg 150 mL/hr over 60 Minutes Intravenous Every 12 hours 08/30/22 1150 08/31/22 1053   08/30/22 1712  vancomycin (VANCOCIN) powder  Status:  Discontinued          As needed 08/30/22 1714 08/30/22 1756   08/30/22 1142  ceFAZolin (ANCEF) 2-4 GM/100ML-% IVPB  Status:  Discontinued       Note to Pharmacy: [facility], [place]: cabinet override      08/30/22 1142 08/30/22 1300   08/30/22 0600  ceFAZolin (ANCEF) IVPB 2g/100 mL premix        2 g 200 mL/hr over 30 Minutes Intravenous On call to O.R. 08/29/22 1848 08/30/22 1401   08/27/22 1041  vancomycin (VANCOCIN) IVPB 1000  mg/200 mL premix  Status:  Discontinued        1,000 mg 200 mL/hr over 60 Minutes Intravenous Every 12 hours 08/27/22 0802 08/30/22 1150   08/26/22 2300  vancomycin (VANCOREADY) IVPB 1250 mg/250 mL  Status:  Discontinued        1,250 mg 166.7 mL/hr over 90 Minutes Intravenous Every 24 hours 08/26/22 1014 08/27/22 0802   08/23/22 2200  vancomycin (VANCOREADY) IVPB 750 mg/150 mL  Status:  Discontinued        750 mg 150 mL/hr over 60 Minutes Intravenous Every 12 hours 08/23/22 0938 08/26/22 1014   08/23/22 1030  vancomycin (VANCOREADY) IVPB 1500 mg/300 mL  1,500 mg 150 mL/hr over 120 Minutes Intravenous  Once 08/23/22 0938 08/23/22 1312   08/20/22 2200  doxycycline (VIBRA-TABS) tablet 100 mg  Status:  Discontinued        100 mg Oral 2 times daily 08/20/22 1737 08/23/22 0927          Medications  acetaminophen  1,000 mg Oral TID   apixaban  5 mg Oral BID   atorvastatin  80 mg Oral Daily   busPIRone  15 mg Oral BID   carvedilol  3.125 mg Oral BID   celecoxib  100 mg Oral BID   digoxin  125 mcg Oral Daily   doxycycline  100 mg Oral Q12H   DULoxetine  20 mg Oral Daily   famotidine  20 mg Oral BID   feeding supplement  237 mL Oral BID BM   ferrous sulfate  325 mg Oral QODAY   finasteride  5 mg Oral Daily   fluticasone  1 spray Each Nare Daily   furosemide  20 mg Oral Daily   insulin aspart  0-5 Units Subcutaneous QHS   insulin aspart  0-9 Units Subcutaneous TID WC   insulin aspart  3 Units Subcutaneous TID WC   insulin glargine-yfgn  20 Units Subcutaneous Daily   lidocaine  1 patch Transdermal Q24H   magnesium oxide  400 mg Oral BID   pantoprazole  40 mg Oral Daily   polyethylene glycol  17 g Oral BID   pregabalin  75 mg Oral QHS   sacubitril-valsartan  1 tablet Oral BID   senna-docusate  1 tablet Oral BID   sodium chloride flush  10-40 mL Intracatheter Q12H   sodium chloride flush  3 mL Intravenous Q12H   tamsulosin  0.4 mg Oral QPC supper      Subjective:    David [name] was seen and examined today.  No acute issues, pain control, awaiting placement.  Objective:   Vitals:   10/11/22 0000 10/11/22 0407 10/11/22 0829 10/11/22 1240  BP: 99/70 117/67 100/76 111/66  Pulse: 88 82 76 96  Resp: 18 18 18 17   Temp: 98 F (36.7 C) 98.1 F (36.7 C) (!) 97.3 F (36.3 C) 98.1 F (36.7 C)  TempSrc:  Oral Oral Oral  SpO2: 99% 97% 94% 98%  Weight:      Height:        Intake/Output Summary (Last 24 hours) at 10/11/2022 1407 Last data filed at 10/11/2022 0000 Gross per 24 hour  Intake --  Output 1000 ml  Net -1000 ml     Wt Readings from Last 3 Encounters:  10/05/22 76.4 kg  08/17/22 79.4 kg  07/09/22 81.6 kg    Physical Exam General: Alert and oriented x 3, NAD Cardiovascular: S1 S2 clear, RRR.  Respiratory: CTAB Gastrointestinal: Soft, NT, ND, NBS  Ext: no pedal edema bilaterally Neuro: no new deficits Psych: Normal affect    Data Reviewed:  I have personally reviewed following labs    CBC Lab Results  Component Value Date   WBC 5.6 10/07/2022   RBC 4.30 10/07/2022   HGB 12.6 (L) 10/07/2022   HCT 39.1 10/07/2022   MCV 90.9 10/07/2022   MCH 29.3 10/07/2022   PLT 237 10/07/2022   MCHC 32.2 10/07/2022   RDW 18.5 (H) 10/07/2022   LYMPHSABS 1.1 10/03/2022   MONOABS 1.0 10/03/2022   EOSABS 0.4 10/03/2022   BASOSABS 0.1 10/03/2022     Last metabolic panel Lab Results  Component Value Date   NA  133 (L) 10/07/2022   K 4.6 10/07/2022   CL 100 10/07/2022   CO2 27 10/07/2022   BUN 16 10/07/2022   CREATININE 1.28 (H) 10/07/2022   GLUCOSE 144 (H) 10/07/2022   GFRNONAA >60 10/07/2022   GFRAA >60 08/30/2019   CALCIUM 9.8 10/07/2022   PHOS 4.0 08/31/2022   PROT 6.8 09/29/2022   ALBUMIN 2.8 (L) 09/29/2022   LABGLOB 2.4 01/08/2021   AGRATIO 1.7 01/08/2021   BILITOT 0.7 09/29/2022   ALKPHOS 96 09/29/2022   AST 16 09/29/2022   ALT 14 09/29/2022   ANIONGAP 6 10/07/2022    CBG (last 3)  Recent Labs    10/10/22 2017  10/11/22 0522 10/11/22 1238  GLUCAP 131* 144* 142*      Coagulation Profile: No results for input(s): "INR", "PROTIME" in the last 168 hours.   Radiology Studies: I have personally reviewed the imaging studies  No results found.     Thad Ranger M.D. [name] 10/11/2022, 2:07 PM  Available via Epic secure chat 7am-7pm After 7 pm, please refer to night coverage provider listed on amion.

## 2022-10-12 LAB — CBC
HCT: 36.7 % — ABNORMAL LOW (ref 39.0–52.0)
Hemoglobin: 11.7 g/dL — ABNORMAL LOW (ref 13.0–17.0)
MCH: 29.5 pg (ref 26.0–34.0)
MCHC: 31.9 g/dL (ref 30.0–36.0)
MCV: 92.4 fL (ref 80.0–100.0)
Platelets: 236 10*3/uL (ref 150–400)
RBC: 3.97 MIL/uL — ABNORMAL LOW (ref 4.22–5.81)
RDW: 17.6 % — ABNORMAL HIGH (ref 11.5–15.5)
WBC: 8.1 10*3/uL (ref 4.0–10.5)
nRBC: 0 % (ref 0.0–0.2)

## 2022-10-12 LAB — BASIC METABOLIC PANEL
Anion gap: 7 (ref 5–15)
BUN: 22 mg/dL (ref 8–23)
CO2: 22 mmol/L (ref 22–32)
Calcium: 9.3 mg/dL (ref 8.9–10.3)
Chloride: 100 mmol/L (ref 98–111)
Creatinine, Ser: 1.33 mg/dL — ABNORMAL HIGH (ref 0.61–1.24)
GFR, Estimated: >60 mL/min (ref >60)
Glucose, Bld: 169 mg/dL — ABNORMAL HIGH (ref 70–99)
Potassium: 3.9 mmol/L (ref 3.5–5.1)
Sodium: 129 mmol/L — ABNORMAL LOW (ref 135–145)

## 2022-10-12 LAB — GLUCOSE, CAPILLARY
Glucose-Capillary: 163 mg/dL — ABNORMAL HIGH (ref 70–99)
Glucose-Capillary: 169 mg/dL — ABNORMAL HIGH (ref 70–99)
Glucose-Capillary: 69 mg/dL — ABNORMAL LOW (ref 70–99)
Glucose-Capillary: 151 mg/dL — ABNORMAL HIGH (ref 70–99)
Glucose-Capillary: 207 mg/dL — ABNORMAL HIGH (ref 70–99)

## 2022-10-12 NOTE — Progress Notes (Signed)
Progress Note   Patient: David [name] ZOX:[id number] DOB: Aug 27, 1960 DOA: 08/20/2022     50 DOS: the patient was seen and examined on 10/12/2022 at 8:42AM      Brief hospital course: David [name] is a 62 y.o. 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 - Continue Flomax and  finasteride  Persistent atrial fibrillation (HCC) Rate controlled - Continue Eliquis - Continue Coreg, digoxin  Uncontrolled type 2 diabetes with hyperglycemia and hypoglycemia, insulin long term use Glucoses controlled - Continue glargine - Continue sliding scale corrections - Continue duloxetine, Celebrex, Lyrica  Anxiety - Continue Buspar  Pleural effusion Stable, asymptomatic. - Repeat CXR in 4-6 weeks, mid July  Acute postoperative anemia due to expected blood loss Stable. Likely some iron deficiency contributing. - Oral iron  Acute toxic encephalopathy At baseline has no cognitive impairment.  Developed encephalopathy (poorly responsive, confused responses) here due to high doses of opiates.  Resolved now.  Hypokalemia Resolved  AKI (acute kidney injury), ruled out  PVD (peripheral vascular disease) (HCC) S/p angioplasty in April - Continue atorvastatin, apixaban  Hypomagnesemia Resolved  Hyponatremia Mild, asymptomatic  Pure hypercholesterolemia - Continue Lipitor           Subjective: Feeling well.  Pain controlled.  Open take the Foley out today.  No fever.  No nursing concerns.     Physical Exam: BP 96/68 (BP Location: Right Arm)   Pulse 71   Temp 97.6 F (36.4 C) (Oral)   Resp 16   Ht 6' (1.829 m)   Wt 74.8 kg   SpO2 96%   BMI 22.37 kg/m   Adult male, sitting up in bed, interactive and appropriate RRR, no murmurs, no peripheral edema Respiratory rate normal, lungs clear without rales or wheezes Abdomen soft with tenderness palpation or guarding   Data Reviewed: Glucose is normal      Disposition: Status is: Inpatient The patient was admitted with disseminated staph infection, vertebral osteomyelitis requiring surgical decompression and prolonged IV antibiotics  He is significantly debilitated and would require rehabilitation except taht we have been unable to find placement  at the moment  [facility] and PT continue to work  with patient who is medically ready for transfer at any time        Author: Alberteen Sam, MD 10/12/2022 3:42 PM  For on call review [email or web address].

## 2022-10-12 NOTE — Progress Notes (Signed)
Upon shift assessment Pt cbg noted at 69. Pt asked to drink PO fluids and apple juice. CBG retaken. Now 151 Pt denies any s/s of hypoglycemia. Pt c/o back pain and prn oxy 10mg  given at this time. 700 ml of green clear urine noted in the urinal. Pt also had a large BM on the BSC.

## 2022-10-12 NOTE — Progress Notes (Signed)
PT Cancellation Note  Patient Details Name: Gates Jividen MRN: [id number] DOB: 1961/01/17   Cancelled Treatment:    Reason Eval/Treat Not Completed: Other (comment).  Declined PT today, asking PT to leave after expressing concern about his pushing a chair with UE's and leaning forward without the brace.  Reports he is permitted to do so, but asked if PT should check with MD first.  Pt was upset about the questioning of his movement without a brace, declines PT today.   Ivar Drape 10/12/2022, 4:42 PM  Samul Dada, PT PhD Acute Rehab Dept. Number: [facility] [id number] and [facility] [phone]

## 2022-10-13 DIAGNOSIS — J90 Pleural effusion, not elsewhere classified: Secondary | ICD-10-CM

## 2022-10-13 LAB — GLUCOSE, CAPILLARY
Glucose-Capillary: 167 mg/dL — ABNORMAL HIGH (ref 70–99)
Glucose-Capillary: 178 mg/dL — ABNORMAL HIGH (ref 70–99)
Glucose-Capillary: 178 mg/dL — ABNORMAL HIGH (ref 70–99)
Glucose-Capillary: 68 mg/dL — ABNORMAL LOW (ref 70–99)
Glucose-Capillary: 107 mg/dL — ABNORMAL HIGH (ref 70–99)

## 2022-10-13 NOTE — Progress Notes (Signed)
Physical Therapy Treatment Patient Details Name: David [name] MRN: [id number] DOB: [birth year] Today's Date: 10/13/2022   History of Present Illness Pt is a 62 y.o. male who presented 08/20/22  with hypoglycemia. 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 5/28 surgical repair: T5-11 posterior/posterolateral arthrodesis  & Transpedicular decompression with partial corpectomy, left T8 & T8-9 laminectomy, bilateral facetectomy. 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 Expect that with high level rehab he could achieve mod I to go home with his wife and be able to be at home in the day by himself. Worked with the standup rollator today with ambulation which was challenging as it moves more easily than 2 wheel RW and pt had difficulty controlling mvmt. Up to mod A needed with this. Pt also performed LE strengthening exercises. Patient will benefit from intensive inpatient follow up therapy, >3 hours/day PT will continue to follow.     Assistance Recommended at Discharge Frequent or constant Supervision/Assistance  If plan is discharge home, recommend the following:  Can travel by private vehicle    Assist for transportation;Two people to help with walking and/or transfers;A lot of help with bathing/dressing/bathroom;Assistance with cooking/housework;Direct supervision/assist for financial management;Direct supervision/assist for medications management;Help with stairs or ramp for entrance   No  Equipment Recommendations  Rolling walker (2 wheels);Wheelchair cushion (measurements PT);Wheelchair  (measurements PT);BSC/3in1    Recommendations for Other Services Rehab consult     Precautions / Restrictions Precautions Precautions: Fall;Back Precaution Booklet Issued: No Required Braces or Orthoses: Spinal Brace Spinal Brace: Thoracolumbosacral orthotic;Applied in sitting position Other Brace: for use when ambulating. Restrictions Weight Bearing Restrictions: Yes RLE Weight Bearing: Weight bearing as tolerated RLE Partial Weight Bearing Percentage or Pounds: heel weight bearing Other Position/Activity Restrictions: with Darco shoe. Has not been using since amp was in 4/24     Mobility  Bed Mobility Overal bed mobility: Modified Independent                  Transfers Overall transfer level: Needs assistance Equipment used: Rollator (4 wheels) Transfers: Sit to/from Stand Sit to Stand: Min guard, Min assist           General transfer comment: min guard A from bed and recliner. Worked on standing and turning around with rollator and pt needed min A    Ambulation/Gait Ambulation/Gait assistance: Editor, commissioning (Feet): 80 Feet (2x) Assistive device: Rollator (4 wheels) Gait Pattern/deviations: Knee hyperextension - right, Knee hyperextension - left, Decreased dorsiflexion - right, Decreased dorsiflexion - left, Drifts right/left, Narrow base of support, Trunk flexed Gait velocity: reduced Gait velocity interpretation: <1.31 ft/sec, indicative of household ambulator   General Gait Details: worked with the standup rollator today which required to constrain more degrees of freedom even with brakes locked. Needed as much as mod A to control motion and steer rollator. Pt fatigued more quickly with this as well. Also worked on moving it fwd and bkwds in sitting for quad and Health and safety inspector Bed  Modified Rankin (Stroke Patients Only)       Balance Overall balance assessment: Needs  assistance Sitting-balance support: Feet supported, No upper extremity supported Sitting balance-Leahy Scale: Good Sitting balance - Comments: donned TLSO while seated on EOB   Standing balance support: Single extremity supported, Bilateral upper extremity supported, No upper extremity supported, During functional activity Standing balance-Leahy Scale: Fair Standing balance comment: able to stand briefly without UE support; reliant on UE support for dynamic tasks                            Cognition Arousal/Alertness: Awake/alert Behavior During Therapy: WFL for tasks assessed/performed Overall Cognitive Status: Within Functional Limits for tasks assessed                                          Exercises Other Exercises Other Exercises: seated toe tapping and heel tapping with rapid sequencing x1 min Other Exercises: sitting flutter kick with LE's, 45 secs    General Comments        Pertinent Vitals/Pain Pain Assessment Pain Assessment: Faces Faces Pain Scale: Hurts little more Pain Location: back Pain Descriptors / Indicators: Discomfort, Guarding Pain Intervention(s): Limited activity within patient's tolerance, Monitored during session    Home Living                          Prior Function            PT Goals (current goals can now be found in the care plan section) Acute Rehab PT Goals Patient Stated Goal: return home and drum again PT Goal Formulation: With patient Time For Goal Achievement: 10/27/22 Potential to Achieve Goals: Good Progress towards PT goals: Progressing toward goals    Frequency    Min 5X/week      PT Plan Current plan remains appropriate    Co-evaluation              AM-PAC PT "6 Clicks" Mobility   Outcome Measure  Help needed turning from your back to your side while in a flat bed without using bedrails?: A Little Help needed moving from lying on your back to sitting on the side of a  flat bed without using bedrails?: A Little Help needed moving to and from a bed to a chair (including a wheelchair)?: A Little Help needed standing up from a chair using your arms (e.g., wheelchair or bedside chair)?: A Little Help needed to walk in hospital room?: A Little Help needed climbing 3-5 steps with a railing? : Total 6 Click Score: 16    End of Session Equipment Utilized During Treatment: Back brace;Gait belt Activity Tolerance: Patient tolerated treatment well Patient left: in chair;with call bell/phone within reach Nurse Communication: Mobility status PT Visit Diagnosis: Difficulty in walking, not elsewhere classified (R26.2);Other abnormalities of gait and mobility (R26.89);Muscle weakness (generalized) (M62.81);Pain;Unsteadiness on feet (R26.81) Pain - Right/Left: Left Pain - part of body: Leg     Time: 1610-9604 PT Time Calculation (min) (ACUTE ONLY): 31 min  Charges:    $Gait Training: 8-22 mins $Therapeutic Exercise: 8-22 mins PT General Charges $$ ACUTE PT VISIT: 1 Visit                     Lyanne Co, PT  Acute Rehab Services Secure chat preferred  Office [phone]    [place] L Iker Nuttall 10/13/2022, 2:07 PM

## 2022-10-13 NOTE — Progress Notes (Signed)
Occupational Therapy Treatment Patient Details Name: David [name] MRN: [id number] DOB: 1960-08-03 Today's Date: 10/13/2022   History of present illness Pt is a 62 y.o. male who presented 08/20/22  with hypoglycemia. 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 5/28 surgical repair: T5-11 posterior/posterolateral arthrodesis  & Transpedicular decompression with partial corpectomy, left T8 & T8-9 laminectomy, bilateral facetectomy. 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 Focused on dynamic standing balance reaching outside of base of support to gather items with minguard to Min A needed. Also further progressing bathroom mobility using RW and BSC over toilet w/ fading physical assistance. Encouraged pt to complete bathroom mobility with nursing staff to further progress ADL skills. Continue to feel pt could improve to Modified Independent level with intensive rehab services.    Recommendations for follow up therapy are one component of a multi-disciplinary discharge planning process, led by the attending physician.  Recommendations may be updated based on patient status, additional functional criteria and insurance authorization.    Assistance Recommended at Discharge Intermittent Supervision/Assistance  Patient can return home with the following  A little help with walking and/or transfers;A little help with bathing/dressing/bathroom;Assistance with cooking/housework;Assist for transportation;Help with stairs or ramp for entrance   Equipment Recommendations  BSC/3in1;Wheelchair  (measurements OT);Wheelchair cushion (measurements OT);Other (comment) (RW)    Recommendations for Other Services Rehab consult    Precautions / Restrictions Precautions Precautions: Fall;Back Required Braces or Orthoses: Spinal Brace Spinal Brace: Thoracolumbosacral orthotic;Applied in sitting position Restrictions Weight Bearing Restrictions: Yes RLE Weight Bearing: Weight bearing as tolerated       Mobility Bed Mobility               General bed mobility comments: received in chair at end of PT session    Transfers Overall transfer level: Needs assistance Equipment used: Rolling walker (2 wheels) Transfers: Sit to/from Stand Sit to Stand: Min guard           General transfer comment: various sit to stands from recliner and BSC over toilet; standing improves with armrests use     Balance Overall balance assessment: Needs assistance Sitting-balance support: Feet supported, No upper extremity supported Sitting balance-Leahy Scale: Good     Standing balance support: Single extremity supported, Bilateral upper extremity supported, No upper extremity supported, During functional activity Standing balance-Leahy Scale: Fair Standing balance comment: able to stand briefly without UE support; reliant on UE support for dynamic tasks                           ADL either performed or assessed with clinical judgement   ADL Overall ADL's : Needs assistance/impaired                     Lower Body Dressing: Supervision/safety;Sitting/lateral leans;Sit to/from stand Lower Body Dressing Details (indicate cue type and reason): mgmt of shoe; able to implement compensatory strategies without cues (requesting chair to prop foot up on). Toilet Transfer: Min guard;Ambulation;Rolling walker (2 wheels) Toilet Transfer Details (indicate cue type and reason): BSC over toilet due to  pt tall stature. practiced x 2 with gradual fading of physical assistance          Functional mobility during ADLs: Min guard;Rolling walker (2 wheels);Minimal assistance General ADL Comments: Guiding pt in simulated item gathering reaching overhead to sticky notes placed throughout room with min guard to MIn A needed for balance reaching outside of BOS. Pt also reporting balance feeling off w/ shoes and toe amp - trialed folded grip sock in toe of shoe w/ pt reporting great improvements in balance    Extremity/Trunk Assessment Upper Extremity Assessment Upper Extremity Assessment: Overall WFL for tasks assessed   Lower Extremity Assessment Lower Extremity Assessment: Defer to PT evaluation        Vision   Vision Assessment?: No apparent visual deficits   Perception     Praxis      Cognition Arousal/Alertness: Awake/alert Behavior During Therapy: WFL for tasks assessed/performed Overall Cognitive Status: Within Functional Limits for tasks assessed                                          Exercises      Shoulder Instructions       General Comments      Pertinent Vitals/ Pain       Pain Assessment Pain Assessment: Faces Faces Pain Scale: Hurts little more Pain Location: back Pain Descriptors / Indicators: Discomfort, Guarding Pain Intervention(s): Monitored during session, Patient requesting pain meds-RN notified  Home Living                                          Prior Functioning/Environment              Frequency  Min 2X/week        Progress Toward Goals  OT Goals(current goals can now be found in the care plan section)  Progress towards OT goals: Progressing toward goals  Acute Rehab OT Goals Patient Stated Goal: be able to walk without assist OT Goal Formulation: With patient Time For Goal Achievement: 10/24/22 Potential to Achieve Goals: Good ADL Goals Pt Will Perform Grooming: Independently;standing Pt Will Perform Lower Body Bathing: with modified independence;sit to/from stand;with  adaptive equipment Pt Will Perform Lower Body Dressing: with modified independence;sit to/from stand;with adaptive equipment Pt Will Transfer to Toilet: with modified independence;ambulating Pt Will Perform Toileting - Clothing Manipulation and hygiene: with modified independence;sit to/from stand Additional ADL Goal #1: Pt to demo ability to gather ADL/IADL items MOD I with most appropriate DME  Plan Discharge plan remains appropriate;Frequency remains appropriate    Co-evaluation                 AM-PAC OT "6 Clicks" Daily Activity     Outcome Measure   Help from another person eating meals?: None Help from another person taking care of personal grooming?: A Little Help from another person toileting, which includes using toliet, bedpan, or urinal?: A Little Help from another person bathing (including washing, rinsing, drying)?: A Little Help from another person to put on and taking off regular upper body clothing?: A Little Help from another person to put on and taking off regular lower body clothing?: A Little 6 Click Score: 19    End of Session Equipment Utilized During Treatment: Gait belt;Rolling walker (2 wheels);Back brace  OT Visit Diagnosis: Unsteadiness on feet (R26.81);Other abnormalities of gait and mobility (R26.89);Muscle weakness (generalized) (M62.81);Pain   Activity Tolerance Patient tolerated treatment well   Patient Left in chair;with call bell/phone within reach   Nurse Communication Mobility status        Time: 6962-9528 OT Time Calculation (min): 34 min  Charges: OT General Charges $OT Visit: 1 Visit OT Treatments $Self Care/Home Management : 8-22 mins $Therapeutic Activity: 8-22 mins  Bradd Canary, OTR/L Acute Rehab Services Office: [phone]   Lorre Munroe 10/13/2022, 1:45 PM

## 2022-10-13 NOTE — Progress Notes (Signed)
  Progress Note   Patient: David [name] WUJ:[id number] DOB: 10/19/1960 DOA: 08/20/2022     51 DOS: the patient was seen and examined on 10/13/2022 at 9:33AM      Brief hospital course: Mr. Rothlisberger is a 62 y.o. 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 - Continue Flomax and finasteride  Persistent atrial fibrillation (HCC) Rate controlled - Cnotnue Eliquis Coreg, digoxin  Uncontrolled type 2 diabetes with hyperglycemia and hypoglycemia, insulin long term use Glucoses labile - Continue glargine - Continue sliding scale corrections - Continue duloxetine, Celebrex, Lyrica  Anxiety - Continue Buspar  Pleural effusion Stable, asymptomatic. - Repeat CXR in 4-6 weeks, mid July, next Monday   PVD (peripheral vascular disease) (HCC) S/p angioplasty in April - Cnotinue atorvastatin, apixaban   Pure hypercholesterolemia - Continue Lipitor           Subjective: Feeling well.  Pain controlled.   No fever.  No nursing concerns.     Physical Exam: BP (!) 140/94 (BP Location: Right Arm)   Pulse 75   Temp (!) 97.3 F (36.3 C) (Oral)   Resp 16   Ht 6' (1.829 m)   Wt 74.8 kg   SpO2 100%   BMI 22.37 kg/m    Adult male, sitting up in bed, interactive and appropriate RRR, no murmurs, no peripheral edema Respiratory rate normal, lungs clear without rales or wheezes Abdomen soft with tenderness palpation or guarding   Data Reviewed: No new      Disposition: Status is: Inpatient The patient was admitted with disseminated staph infection, vertebral osteomyelitis requiring surgical decompression and prolonged IV antibiotics  He is significantly debilitated and would require rehabilitation except taht we have been unable to find placement at the moment  [facility] and PT continue to work with patient who is medically ready for transfer at any time        Author: Alberteen Sam, MD 10/13/2022 12:39 PM  For on call review [email or web address].

## 2022-10-14 LAB — GLUCOSE, CAPILLARY
Glucose-Capillary: 148 mg/dL — ABNORMAL HIGH (ref 70–99)
Glucose-Capillary: 228 mg/dL — ABNORMAL HIGH (ref 70–99)
Glucose-Capillary: 207 mg/dL — ABNORMAL HIGH (ref 70–99)
Glucose-Capillary: 160 mg/dL — ABNORMAL HIGH (ref 70–99)

## 2022-10-14 MED ORDER — INSULIN GLARGINE-YFGN 100 UNIT/ML ~~LOC~~ SOLN
16.0000 [IU] | Freq: Every day | SUBCUTANEOUS | Status: DC
  Administered 2022-10-14 – 2022-10-18 (×5): 16 [IU] via SUBCUTANEOUS
  Filled 2022-10-14 (×6): qty 0.16

## 2022-10-14 NOTE — Progress Notes (Signed)
  Progress Note   Patient: David [name] WUJ:[id number] DOB: 06-24-1960 DOA: 08/20/2022     52 DOS: the patient was seen and examined on 10/14/2022 at 8:49AM      Brief hospital course: [name] is a 62 y.o. 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 source osteomyelitis of the foot (now s/p amputation) -Continue doxycycline - Continue pain medicines, fullscope nonopiate adjunct regimen, and taper opiates as able      Essential hypertension BP controlled - Continue Coreg, clonidine, furosemide, Entresto  Uncontrolled type 2 diabetes with hyperglycemia and hypoglycemia, insulin long term use Has had some hypoglycemia as last few days - Continue glargine, lower dose - Stop scheduled aspart - Reduce sliding scale corrections - Continue duloxetine, Celebrex, Lyrica  Anxiety - Continue Buspar  Acute urinary retention Resolved  Chronic systolic CHF (congestive heart failure) (HCC) - Continue Entresto, digoxin, Coreg, Lasix  Pleural effusion -Chest x-ray follow up ordered for next week   PVD (peripheral vascular disease) (HCC) Pure hypercholesterolemia Persistent atrial fibrillation (HCC) Rate controlled - Continue Eliquis - Continue Coreg, digoxin - Continue Lipitor          Subjective: Patient's pain is well-controlled, he is making progress with physical therapy, he has no complaints today, no fever, no nursing concerns     Physical Exam: BP 101/77 (BP Location: Right Arm)   Pulse 74   Temp 97.9 F (36.6 C) (Oral)   Resp 18   Ht 6' (1.829 m)   Wt 74.9 kg   SpO2 96%   BMI 22.39 kg/m   Adult male, lying in bed, no acute distress RRR, no murmurs, no peripheral  edema Respiratory normal, lungs clear without rales or wheezes   Data Reviewed: Glucose is somewhat low      Disposition: Status is: Inpatient         Author: Alberteen Sam, MD 10/14/2022 10:22 AM  For on call review [email or web address].

## 2022-10-14 NOTE — TOC Progression Note (Signed)
Transition of Care [facility]) - Progression Note    Patient Details  Name: David [name] MRN: [id number] Date of Birth: [birth year]  [facility]) CM/SW Contact  Baldemar Lenis, [place] Phone Number: 10/14/2022, 11:31 AM  Clinical Narrative:   CSW updated by therapy that patient showing improvements, wanting CIR to evaluate again. CSW asked CIR to screen again, but they still don't feel he is a good candidate. CIR noting that patient now has a managed Medicaid attached to chart, asking if that would get SNF placement. CSW to follow.    Expected Discharge Plan: Skilled Nursing Facility Barriers to Discharge: Inadequate or no insurance, SNF Pending bed offer, SNF Pending payor source - LOG  Expected Discharge Plan and Services   Discharge Planning Services: CM Consult Post Acute Care Choice: Home Health Living arrangements for the past 2 months: Hotel/Motel                                       Social Determinants of Health (SDOH) Interventions SDOH Screenings   Food Insecurity: Patient Unable To Answer (08/23/2022)  Housing: Patient Unable To Answer (08/23/2022)  Transportation Needs: Patient Unable To Answer (08/23/2022)  Utilities: Patient Unable To Answer (08/23/2022)  Depression (PHQ2-9): Low Risk  (11/12/2021)  Stress: Stress Concern Present (09/26/2017)  Tobacco Use: Medium Risk (09/15/2022)    Readmission Risk Interventions    09/06/2021   12:07 PM  Readmission Risk Prevention Plan  Post Dischage Appt Complete  Medication Screening Complete  Transportation Screening Complete

## 2022-10-14 NOTE — Progress Notes (Signed)
Inpatient Rehabilitation Admissions Coordinator    Patient has made progress with therapy over past few weeks. I will place consult for rehab reassessment due to progress. Noted wife works , No SNF offers due to payor. An AC will follow up.  Ottie Glazier, RN, MSN Rehab Admissions Coordinator [phone] 10/14/2022 6:41 PM

## 2022-10-14 NOTE — Progress Notes (Signed)
Occupational Therapy Treatment Patient Details Name: David [name] MRN: [id number] DOB: 1961/03/05 Today's Date: 10/14/2022   History of present illness Pt is a 62 y.o. male who presented 08/20/22  with hypoglycemia. 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 5/28 surgical repair: T5-11 posterior/posterolateral arthrodesis  & Transpedicular decompression with partial corpectomy, left T8 & T8-9 laminectomy, bilateral facetectomy. 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 Overall, pt requires min guard for standing tasks though unsteadiness and up to Min A needed to correct balance at times. Reinforced bathroom mobility using RW with nursing staff to further progress ADL independence.    Recommendations for follow up therapy are one component of a multi-disciplinary discharge planning process, led by the attending physician.  Recommendations may be updated based on patient status, additional functional criteria and insurance authorization.    Assistance Recommended at Discharge Intermittent Supervision/Assistance  Patient can return home with the following  A little help with walking and/or transfers;A little help with bathing/dressing/bathroom;Assistance with cooking/housework;Assist for transportation;Help with stairs or ramp for entrance   Equipment Recommendations  BSC/3in1;Wheelchair (measurements OT);Wheelchair cushion (measurements  OT);Other (comment) (RW)    Recommendations for Other Services Rehab consult    Precautions / Restrictions Precautions Precautions: Fall;Back Required Braces or Orthoses: Spinal Brace Spinal Brace: Thoracolumbosacral orthotic;Applied in sitting position Restrictions Weight Bearing Restrictions: No       Mobility Bed Mobility               General bed mobility comments: EOB on entry    Transfers Overall transfer level: Needs assistance Equipment used: Rolling walker (2 wheels) Transfers: Sit to/from Stand Sit to Stand: Min guard           General transfer comment: various stands from bed, recliner and BSC over toilet w/ RW     Balance Overall balance assessment: Needs assistance Sitting-balance support: Feet supported, No upper extremity supported Sitting balance-Leahy Scale: Good     Standing balance support: Single extremity supported, Bilateral upper extremity supported, No upper extremity supported, During functional activity Standing balance-Leahy Scale: Fair                             ADL either performed or assessed with clinical judgement   ADL Overall ADL's : Needs assistance/impaired     Grooming: Min guard;Standing Grooming Details (indicate cue type and reason): standing to shave at sink, bracing anteriorly due to balance deficits vs one UE support on sink. minor LOB leaning to place item on tray table to side but able to recover         Upper Body Dressing : Set up;Sitting Upper Body Dressing Details (indicate cue type and reason): TLSO brace mgmt Lower Body Dressing: Supervision/safety;Sitting/lateral leans Lower Body Dressing Details (indicate cue type and reason): placing chair in front of pt, able to prop foot up to don shoes without excessive forward leaning. Toilet Transfer: Min guard;Ambulation;Rolling walker (2 wheels)  Toilet Transfer Details (indicate cue type and reason): BSC over toilet - completed x 2 with improving  problem solving and balance to open door on second trial           General ADL Comments: Assisted also with PT in stair training attempts    Extremity/Trunk Assessment Upper Extremity Assessment Upper Extremity Assessment: Overall WFL for tasks assessed   Lower Extremity Assessment Lower Extremity Assessment: Defer to PT evaluation        Vision   Vision Assessment?: No apparent visual deficits   Perception     Praxis      Cognition Arousal/Alertness: Awake/alert Behavior During Therapy: WFL for tasks assessed/performed Overall Cognitive Status: Within Functional Limits for tasks assessed                                          Exercises      Shoulder Instructions       General Comments      Pertinent Vitals/ Pain       Pain Assessment Pain Assessment: Faces Faces Pain Scale: Hurts little more Pain Location: back Pain Descriptors / Indicators: Discomfort, Guarding Pain Intervention(s): Monitored during session  Home Living                                          Prior Functioning/Environment              Frequency  Min 5X/week        Progress Toward Goals  OT Goals(current goals can now be found in the care plan section)  Progress towards OT goals: Progressing toward goals  Acute Rehab OT Goals Patient Stated Goal: be able to walk without assist OT Goal Formulation: With patient Time For Goal Achievement: 10/24/22 Potential to Achieve Goals: Good ADL Goals Pt Will Perform Grooming: Independently;standing Pt Will Perform Lower Body Bathing: with modified independence;sit to/from stand;with adaptive equipment Pt Will Perform Lower Body Dressing: with modified independence;sit to/from stand;with adaptive equipment Pt Will Transfer to Toilet: with modified independence;ambulating Pt Will Perform Toileting - Clothing Manipulation and hygiene: with modified independence;sit to/from stand Additional ADL  Goal #1: Pt to demo ability to gather ADL/IADL items MOD I with most appropriate DME  Plan Discharge plan remains appropriate;Frequency remains appropriate;Frequency needs to be updated    Co-evaluation                 AM-PAC OT "6 Clicks" Daily Activity     Outcome Measure   Help from another person eating meals?: None Help from another person taking care of personal grooming?: A Little Help from another person toileting, which includes using toliet, bedpan, or urinal?: A Little Help from another person bathing (including washing, rinsing, drying)?: A Little Help from another person to put on and taking off regular upper body clothing?: A Little Help from another person to put on and taking off regular lower body clothing?: A Little 6 Click Score: 19    End of Session Equipment Utilized During Treatment: Gait belt;Rolling walker (2 wheels);Back brace  OT Visit Diagnosis: Unsteadiness on feet (R26.81);Other abnormalities of gait and mobility (R26.89);Muscle weakness (generalized) (M62.81);Pain   Activity Tolerance Patient tolerated treatment well   Patient Left in chair;Other (comment) (with PT)   Nurse Communication  Time: 1249-1330 OT Time Calculation (min): 41 min  Charges: OT General Charges $OT Visit: 1 Visit OT Treatments $Self Care/Home Management : 8-22 mins $Therapeutic Activity: 8-22 mins  David [name], OTR/L Acute Rehab Services Office: [phone]   David [name] 10/14/2022, 2:15 PM

## 2022-10-14 NOTE — TOC Progression Note (Signed)
Transition of Care [facility]) - Progression Note    Patient Details  Name: David [name] MRN: [id number] Date of Birth: [birth year]  Transition of Care [facility]) CM/SW Contact  Baldemar Lenis, [place] Phone Number: 10/14/2022, 11:33 AM  Clinical Narrative:   CSW spoke with Admissions at [facility] to discuss patient's managed Medicaid. Per Admissions, the managed Medicaid still will not cover SNF unless patient is deemed disabled, he will still need an LOG. CSW spoke with [facility] Supervisor about LOG for SNF, and we are unable to LOG for therapy for patient with his insurance. CSW spoke with Admissions at [facility] and they are unable to offer if there is not an LOG for therapy. CSW sent information to [facility] Leadership and Acute Rehab to ask about possibility of STAR program for patient to work towards return home, STAR program will begin on Monday. CSW to follow.     Expected Discharge Plan: Skilled Nursing Facility Barriers to Discharge: Inadequate or no insurance, SNF Pending bed offer, SNF Pending payor source - LOG  Expected Discharge Plan and Services   Discharge Planning Services: CM Consult Post Acute Care Choice: Home Health Living arrangements for the past 2 months: Hotel/Motel                                       Social Determinants of Health (SDOH) Interventions SDOH Screenings   Food Insecurity: Patient Unable To Answer (08/23/2022)  Housing: Patient Unable To Answer (08/23/2022)  Transportation Needs: Patient Unable To Answer (08/23/2022)  Utilities: Patient Unable To Answer (08/23/2022)  Depression (PHQ2-9): Low Risk  (11/12/2021)  Stress: Stress Concern Present (09/26/2017)  Tobacco Use: Medium Risk (09/15/2022)    Readmission Risk Interventions    09/06/2021   12:07 PM  Readmission Risk Prevention Plan  Post Dischage Appt Complete  Medication Screening Complete  Transportation Screening Complete

## 2022-10-14 NOTE — Progress Notes (Signed)
Physical Therapy Treatment Patient Details Name: David [name] MRN: [id number] DOB: March 17, 1961 Today's Date: 10/14/2022   History of Present Illness Pt is a 62 y.o. male who presented 08/20/22  with hypoglycemia. 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 5/28 surgical repair: T5-11 posterior/posterolateral arthrodesis  & Transpedicular decompression with partial corpectomy, left T8 & T8-9 laminectomy, bilateral facetectomy. 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  He is motivated and working well with therapy to improve his strength in order to return home.  Pt ambulating 100' x 2 with RW and performed steps with bil rails.  He does demonstrate weakness in bil LE (L worse than R) with L knee hyperextending with fatigue.  Pt requiring multiple 1-2 min rest breaks during session. Educated on exercises to help strengthen L LE.  Pt eager to continue to increase therapy in order to return home - noted plan to begin [facility] program Monday.     Assistance Recommended at Discharge Frequent or constant Supervision/Assistance  If plan is discharge home, recommend the following:  Can travel by private vehicle    Assist for transportation;Assistance with cooking/housework;Direct supervision/assist for financial management;Direct supervision/assist for medications management;Help with stairs or ramp for entrance;A little help with walking and/or transfers;A little help with bathing/dressing/bathroom   No  Equipment Recommendations  Rolling walker (2 wheels);Wheelchair cushion (measurements PT);Wheelchair (measurements PT);BSC/3in1     Recommendations for Other Services Rehab consult     Precautions / Restrictions Precautions Precautions: Fall;Back Required Braces or Orthoses: Spinal Brace Spinal Brace: Thoracolumbosacral orthotic;Applied in sitting position Other Brace: for use when ambulating. Restrictions Weight Bearing Restrictions: No RLE Weight Bearing: Weight bearing as tolerated RLE Partial Weight Bearing Percentage or Pounds: heel weight bearing Other Position/Activity Restrictions: with Darco shoe. Has not been using since amp was in 4/24     Mobility  Bed Mobility               General bed mobility comments: in chair with OT at arrival    Transfers Overall transfer level: Needs assistance Equipment used: Rolling walker (2 wheels) Transfers: Sit to/from Stand Sit to Stand: Min guard           General transfer comment: Sit to stand x 7 from recliner.  Pt wanting to strengthen L LE so on last 2 had him favor L LE by placing R forward    Ambulation/Gait Ambulation/Gait assistance: Min guard Gait Distance (Feet): 100 Feet (100'x2) Assistive device: Rolling walker (2 wheels) Gait Pattern/deviations: Knee hyperextension - left, Decreased dorsiflexion - left, Decreased dorsiflexion - right Gait velocity: decreased     General Gait Details: Pt ambulating with RW (did not like stand up rollator yesterday).  Initially starting well with good control over knee extension but with fatigue L knee begins to hyperextend and pt requiring rest break.   Stairs Stairs: Yes Stairs assistance: Min assist, Min guard Stair Management: Step to pattern, Forwards, Two rails Number of Stairs: 5 (5x2) General stair comments: Did 5 steps x 2 with bil rails and 3 min rest break between bouts.  Overall only required min guard but did need one instance of  min A when coming down 6" step to stabilize (L knee hyperextending). Performed wtih "up with good (R) and down with bad (L)"   Wheelchair Mobility      Tilt Bed    Modified Rankin (Stroke Patients Only)       Balance Overall balance assessment: Needs assistance Sitting-balance support: Feet supported, No upper extremity supported Sitting balance-Leahy Scale: Good     Standing balance support: Bilateral upper extremity supported, No upper extremity supported Standing balance-Leahy Scale: Fair Standing balance comment: able to stand briefly without UE support; reliant on UE support for dynamic tasks                            Cognition Arousal/Alertness: Awake/alert Behavior During Therapy: WFL for tasks assessed/performed Overall Cognitive Status: Within Functional Limits for tasks assessed                                          Exercises General Exercises - Lower Extremity Quad Sets: AROM, Left, 10 reps, Supine (with pillow under L knee to provide some resistance) Long Arc Quad: AROM, Both (10x2 ; cues to focus on control and full ext) Other Exercises Other Exercises: Mini-squats 10x2 with RW and min guard with R leg forward to strengthen L LE.  After about 8 reps pt begins to fatigue noting decreased control L knee flexion.  Cued to rest when needed    General Comments General comments (skin integrity, edema, etc.): Encouraged to perform quad sets and LAQ on L (could do bil but focus L) on his own as able for strengthening.  Also , discussed favoring L with transfers (right foot forward) in order to strengthen L - did discuss if painful or bad day to place feet even.      Pertinent Vitals/Pain Pain Assessment Pain Assessment: Faces Faces Pain Scale: Hurts little more Pain Location: back Pain Descriptors / Indicators: Discomfort, Guarding Pain Intervention(s): Limited activity within patient's tolerance, Monitored during session, Other (comment) (pt reports he is trying to take less pain meds so did not have prior to PT)    Home Living                          Prior Function             PT Goals (current goals can now be found in the care plan section) Progress towards PT goals: Progressing toward goals    Frequency    Min 5X/week      PT Plan Current plan remains appropriate    Co-evaluation              AM-PAC PT "6 Clicks" Mobility   Outcome Measure  Help needed turning from your back to your side while in a flat bed without using bedrails?: A Little Help needed moving from lying on your back to sitting on the side of a flat bed without using bedrails?: A Little Help needed moving to and from a bed to a chair (including a wheelchair)?: A Little Help needed standing up from a chair using your arms (e.g., wheelchair or bedside chair)?: A Little Help needed to walk in hospital room?: A Little Help needed climbing 3-5 steps with a railing? : A Little 6 Click Score: 18    End of Session Equipment Utilized  During Treatment: Back brace;Gait belt Activity Tolerance: Patient tolerated treatment well Patient left: in chair;with call bell/phone within reach Nurse Communication: Mobility status PT Visit Diagnosis: Difficulty in walking, not elsewhere classified (R26.2);Other abnormalities of gait and mobility (R26.89);Muscle weakness (generalized) (M62.81);Pain;Unsteadiness on feet (R26.81) Pain - Right/Left: Left Pain - part of body: Leg     Time: 7829-5621 PT Time Calculation (min) (ACUTE ONLY): 34 min  Charges:    $Gait Training: 8-22 mins $Therapeutic Exercise: 8-22 mins PT General Charges $$ ACUTE PT VISIT: 1 Visit                     Anise Salvo, PT Acute Rehab Services [facility] [phone]    Rayetta Humphrey 10/14/2022, 3:03 PM

## 2022-10-14 NOTE — Plan of Care (Signed)
  Problem: Activity: Goal: Ability to return to baseline activity level will improve Outcome: Progressing   Problem: Health Behavior/Discharge Planning: Goal: Ability to manage health-related needs will improve Outcome: Progressing   Problem: Clinical Measurements: Goal: Will remain free from infection Outcome: Progressing   Problem: Activity: Goal: Risk for activity intolerance will decrease Outcome: Progressing

## 2022-10-15 DIAGNOSIS — M4644 Discitis, unspecified, thoracic region: Secondary | ICD-10-CM | POA: Diagnosis not present

## 2022-10-15 DIAGNOSIS — D62 Acute posthemorrhagic anemia: Secondary | ICD-10-CM | POA: Diagnosis not present

## 2022-10-15 DIAGNOSIS — I10 Essential (primary) hypertension: Secondary | ICD-10-CM | POA: Diagnosis not present

## 2022-10-15 DIAGNOSIS — E119 Type 2 diabetes mellitus without complications: Secondary | ICD-10-CM | POA: Diagnosis not present

## 2022-10-15 LAB — GLUCOSE, CAPILLARY
Glucose-Capillary: 174 mg/dL — ABNORMAL HIGH (ref 70–99)
Glucose-Capillary: 151 mg/dL — ABNORMAL HIGH (ref 70–99)
Glucose-Capillary: 173 mg/dL — ABNORMAL HIGH (ref 70–99)
Glucose-Capillary: 108 mg/dL — ABNORMAL HIGH (ref 70–99)

## 2022-10-15 NOTE — Progress Notes (Signed)
  Progress Note   Patient: David [name] [id number]:[id number] DOB: 06-14-1960 DOA: 08/20/2022     53 DOS: the patient was seen and examined on 10/15/2022 at 8:49AM      Brief hospital course: David [name] is a 62 y.o. 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 source osteomyelitis of the foot (now s/p amputation) -Continue doxycycline - Continue pain medicines, fullscope nonopiate adjunct regimen, and taper opiates as able      Essential hypertension BP controlled - Continue Coreg, clonidine, furosemide, Entresto  Uncontrolled type 2 diabetes with hyperglycemia and hypoglycemia, insulin long term use Glucoses improved - Continue glargine - Continue sliding scale corrections - Continue duloxetine, Celebrex, Lyrica  Anxiety - Continue Buspar  Acute urinary retention Resolved  Chronic systolic CHF (congestive heart failure) (HCC) - Continue Entresto, Coreg, digoxin, Lasix  Pleural effusion -Chest x-ray follow up ordered for next week   PVD (peripheral vascular disease) (HCC) Pure hypercholesterolemia Persistent atrial fibrillation (HCC) Rate controlled - Continue Eliquis, Coreg, digoxin - Continue Lipitor          Subjective: No new complaints, nursing concerns     Physical Exam: BP 116/82 (BP Location: Right Arm)   Pulse 70   Temp 97.9 F (36.6 C) (Oral)   Resp 17   Ht 6' (1.829 m)   Wt 69.7 kg   SpO2 97%   BMI 20.85 kg/m   Adult male, lying in bed, no acute distress RRR, no murmurs, no peripheral edema Respiratory normal, clear without rales or wheezes   Data Reviewed: Glucose improved      Disposition: Status is: Inpatient         Author: Alberteen Sam, MD 10/15/2022 11:18 AM  For on call review [email or web address].

## 2022-10-16 DIAGNOSIS — M4644 Discitis, unspecified, thoracic region: Secondary | ICD-10-CM | POA: Diagnosis not present

## 2022-10-16 LAB — GLUCOSE, CAPILLARY
Glucose-Capillary: 143 mg/dL — ABNORMAL HIGH (ref 70–99)
Glucose-Capillary: 194 mg/dL — ABNORMAL HIGH (ref 70–99)
Glucose-Capillary: 180 mg/dL — ABNORMAL HIGH (ref 70–99)
Glucose-Capillary: 132 mg/dL — ABNORMAL HIGH (ref 70–99)

## 2022-10-16 NOTE — Plan of Care (Signed)
Problem: Education: Goal: Understanding of CV disease, CV risk reduction, and recovery process will improve Outcome: Progressing Goal: Individualized Educational Video(s) Outcome: Progressing   Problem: Activity: Goal: Ability to return to baseline activity level will improve Outcome: Progressing   Problem: Cardiovascular: Goal: Ability to achieve and maintain adequate cardiovascular perfusion will improve Outcome: Progressing Goal: Vascular access site(s) Level 0-1 will be maintained Outcome: Progressing   Problem: Health Behavior/Discharge Planning: Goal: Ability to safely manage health-related needs after discharge will improve Outcome: Progressing   Problem: Education: Goal: Knowledge of General Education information will improve Description: Including pain rating scale, medication(s)/side effects and non-pharmacologic comfort measures Outcome: Progressing   Problem: Health Behavior/Discharge Planning: Goal: Ability to manage health-related needs will improve Outcome: Progressing   Problem: Clinical Measurements: Goal: Ability to maintain clinical measurements within normal limits will improve Outcome: Progressing Goal: Will remain free from infection Outcome: Progressing Goal: Diagnostic test results will improve Outcome: Progressing Goal: Respiratory complications will improve Outcome: Progressing Goal: Cardiovascular complication will be avoided Outcome: Progressing   Problem: Activity: Goal: Risk for activity intolerance will decrease Outcome: Progressing   Problem: Nutrition: Goal: Adequate nutrition will be maintained Outcome: Progressing   Problem: Coping: Goal: Level of anxiety will decrease Outcome: Progressing   Problem: Elimination: Goal: Will not experience complications related to bowel motility Outcome: Progressing Goal: Will not experience complications related to urinary retention Outcome: Progressing   Problem: Pain Managment: Goal:  General experience of comfort will improve Outcome: Progressing   Problem: Safety: Goal: Ability to remain free from injury will improve Outcome: Progressing   Problem: Skin Integrity: Goal: Risk for impaired skin integrity will decrease Outcome: Progressing   Problem: Education: Goal: Ability to describe self-care measures that may prevent or decrease complications (Diabetes Survival Skills Education) will improve Outcome: Progressing Goal: Individualized Educational Video(s) Outcome: Progressing   Problem: Coping: Goal: Ability to adjust to condition or change in health will improve Outcome: Progressing   Problem: Fluid Volume: Goal: Ability to maintain a balanced intake and output will improve Outcome: Progressing   Problem: Health Behavior/Discharge Planning: Goal: Ability to identify and utilize available resources and services will improve Outcome: Progressing Goal: Ability to manage health-related needs will improve Outcome: Progressing   Problem: Metabolic: Goal: Ability to maintain appropriate glucose levels will improve Outcome: Progressing   Problem: Nutritional: Goal: Maintenance of adequate nutrition will improve Outcome: Progressing Goal: Progress toward achieving an optimal weight will improve Outcome: Progressing   Problem: Skin Integrity: Goal: Risk for impaired skin integrity will decrease Outcome: Progressing   Problem: Tissue Perfusion: Goal: Adequacy of tissue perfusion will improve Outcome: Progressing   Problem: Education: Goal: Ability to verbalize activity precautions or restrictions will improve Outcome: Progressing Goal: Knowledge of the prescribed therapeutic regimen will improve Outcome: Progressing Goal: Understanding of discharge needs will improve Outcome: Progressing   Problem: Activity: Goal: Ability to avoid complications of mobility impairment will improve Outcome: Progressing Goal: Ability to tolerate increased activity  will improve Outcome: Progressing Goal: Will remain free from falls Outcome: Progressing   Problem: Bowel/Gastric: Goal: Gastrointestinal status for postoperative course will improve Outcome: Progressing   Problem: Clinical Measurements: Goal: Ability to maintain clinical measurements within normal limits will improve Outcome: Progressing Goal: Postoperative complications will be avoided or minimized Outcome: Progressing Goal: Diagnostic test results will improve Outcome: Progressing   Problem: Pain Management: Goal: Pain level will decrease Outcome: Progressing   Problem: Skin Integrity: Goal: Will show signs of wound healing Outcome: Progressing   Problem: Health Behavior/Discharge   Planning: Goal: Identification of resources available to assist in meeting health care needs will improve Outcome: Progressing   Problem: Bladder/Genitourinary: Goal: Urinary functional status for postoperative course will improve Outcome: Progressing   

## 2022-10-16 NOTE — Progress Notes (Signed)
  Progress Note   Patient: David [name] [id number]:[id number] DOB: 1961/01/29 DOA: 08/20/2022     54 DOS: the patient was seen and examined on 10/16/2022 at 8:49AM      Brief hospital course: [name] is a 62 y.o. 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 source osteomyelitis of the foot (now s/p amputation) -Continue doxycycline - Continue pain medicines, fullscope nonopiate adjunct regimen, and taper opiates as able      Essential hypertension BP controlled - Continue Coreg, clonidine, furosemide, Entresto  Uncontrolled type 2 diabetes with hyperglycemia and hypoglycemia, insulin long term use Glucoses improved - Continue glargine - Continue sliding scale corrections - Continue duloxetine, Celebrex, Lyrica  Anxiety - Continue Buspar  Acute urinary retention Resolved  Chronic systolic CHF (congestive heart failure) (HCC) - Continue Entresto, Coreg, digoxin, Lasix  Pleural effusion -Chest x-ray follow up ordered for next week   PVD (peripheral vascular disease) (HCC) Pure hypercholesterolemia Persistent atrial fibrillation (HCC) Rate controlled - Continue Eliquis, Coreg, digoxin - Continue Lipitor          Subjective: No nursing concersn, no complaints     Physical Exam: BP 130/89 (BP Location: Right Arm)   Pulse 71   Temp 98.1 F (36.7 C) (Oral)   Resp 18   Ht 6' (1.829 m)   Wt 69.7 kg   SpO2 96%   BMI 20.85 kg/m   Adult male, sitting up in bed, no acute distress RRR, no murmurs, no peripheral edema Respiratory normal, clear without rales or wheezes   Data Reviewed: Glucose improved      Disposition: Status is: Inpatient         Author: Alberteen Sam, MD 10/16/2022 2:03 PM  For on call review [email or web address].

## 2022-10-17 ENCOUNTER — Inpatient Hospital Stay (HOSPITAL_COMMUNITY): Payer: Medicaid Other

## 2022-10-17 DIAGNOSIS — M4644 Discitis, unspecified, thoracic region: Secondary | ICD-10-CM | POA: Diagnosis not present

## 2022-10-17 LAB — GLUCOSE, CAPILLARY
Glucose-Capillary: 170 mg/dL — ABNORMAL HIGH (ref 70–99)
Glucose-Capillary: 239 mg/dL — ABNORMAL HIGH (ref 70–99)
Glucose-Capillary: 196 mg/dL — ABNORMAL HIGH (ref 70–99)
Glucose-Capillary: 195 mg/dL — ABNORMAL HIGH (ref 70–99)

## 2022-10-17 NOTE — Progress Notes (Signed)
  Progress Note   Patient: David [name] QQV:[id number] DOB: 11-28-1960 DOA: 08/20/2022     55 DOS: the patient was seen and examined on 10/17/2022       Brief hospital course: David [name] is a 62 y.o. 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 source osteomyelitis of the foot (now s/p amputation) -Continue doxycycline - Continue pain medicines, fullscope nonopiate adjunct regimen, and taper opiates as able      Essential hypertension BP controlled - Continue Coreg, clonidine, furosemide, Entresto  Uncontrolled type 2 diabetes with hyperglycemia and hypoglycemia, insulin long term use Glucoses improved - Continue glargine - Continue sliding scale corrections - Continue duloxetine, Celebrex, Lyrica  Anxiety - Continue Buspar  Acute urinary retention Resolved  Chronic systolic CHF (congestive heart failure) (HCC) - Continue Entresto, Coreg, digoxin, Lasix  Pleural effusion -Chest x-ray follow up ordered for next week   PVD (peripheral vascular disease) (HCC) Pure hypercholesterolemia Persistent atrial fibrillation (HCC) Rate controlled - Continue Eliquis, Coreg, digoxin - Continue Lipitor          Subjective: No nursing concersn, no complaints     Physical Exam: BP (!) 158/97 (BP Location: Right Arm)   Pulse 99   Temp 98.2 F (36.8 C) (Oral)   Resp 18   Ht 6' (1.829 m)   Wt 69.7 kg   SpO2 100%   BMI 20.85 kg/m   Adult male, sitting up in bed, no acute distress RRR, no murmurs, no peripheral edema Respiratory normal, clear without rales or wheezes   Data Reviewed: Glucose improved      Disposition: Status is: Inpatient         Author: Alberteen Sam, MD 10/17/2022 3:28 PM  For on call review [email or web address].

## 2022-10-17 NOTE — Progress Notes (Addendum)
Inpatient Rehab Admissions Coordinator:    I met with Pt. To discuss potential CIR admit. Pt. States that he is interested in CIR. I spoke with his wife, however, and she states that they are likely to be evicted from the extended stay hotel they'd been living in in the next few weeks and that she is not sure that she can obtain housing after that. I will discuss with pt., but I think it may make more sense for him to remain on acute in [facility] program or d/c to SNF.   Addendum: I discussed what Pt.'s wife told me with the pt. He states that if he cannot return to where he was living before he may have an aunt he could stay with at d/c. States he will call and talk to her and get back with me.   Megan Salon, MS, CCC-SLP Rehab Admissions Coordinator  [phone] (office)  Megan Salon, MS, CCC-SLP Rehab Admissions Coordinator  [phone] (office)

## 2022-10-17 NOTE — Progress Notes (Signed)
Occupational Therapy Treatment Patient Details Name: David [name] MRN: [id number] DOB: [birth year] Today's Date: 10/17/2022   History of present illness Pt is a 62 y.o. male who presented 08/20/22  with hypoglycemia. 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 5/28 surgical repair: T5-11 posterior/posterolateral arthrodesis  & Transpedicular decompression with partial corpectomy, left T8 & T8-9 laminectomy, bilateral facetectomy. 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 Patient dressed upon arrival and ambulated to sink from EOB to perform grooming with one to no UE support, although patient leans on sink for support with no UE support. Patient performed mobility, wheelchair push-ups and squats to increase UB/LB strengthening to increase independence with mobility. Patient to continue to be followed by acute OT with [facility] program to increase independence with self care and mobility.    Recommendations for follow up therapy are one component of a multi-disciplinary discharge planning process, led by the attending physician.  Recommendations may be updated based on patient status, additional functional criteria and insurance authorization.    Assistance Recommended at Discharge Intermittent Supervision/Assistance  Patient can return home with the following  A little help with walking and/or transfers;A little help with bathing/dressing/bathroom;Assistance with cooking/housework;Assist for transportation;Help with stairs or ramp for  entrance   Equipment Recommendations  BSC/3in1;Wheelchair (measurements OT);Wheelchair cushion (measurements OT);Other (comment) (RW)    Recommendations for Other Services      Precautions / Restrictions Precautions Precautions: Fall;Back Required Braces or Orthoses: Spinal Brace Spinal Brace: Thoracolumbosacral orthotic;Applied in sitting position Restrictions Weight Bearing Restrictions: No RLE Weight Bearing: Weight bearing as tolerated       Mobility Bed Mobility Overal bed mobility: Modified Independent Bed Mobility: Supine to Sit           General bed mobility comments: Patient able to get to EOB without assistance    Transfers Overall transfer level: Needs assistance Equipment used: Rolling walker (2 wheels) Transfers: Sit to/from Stand Sit to Stand: Min guard           General transfer comment: min guard to power up and for balance during transfer     Balance Overall balance assessment: Needs assistance Sitting-balance support: Feet supported, No upper extremity supported Sitting balance-Leahy Scale: Good Sitting balance - Comments: donned TLSO while seated on EOB   Standing balance support: Single extremity supported, Bilateral upper extremity supported, No upper extremity supported, During functional activity Standing balance-Leahy Scale: Fair Standing balance comment: able to stand at sink with one extremity support and no UE support for limited bouts of time                           ADL either performed or assessed with clinical judgement   ADL Overall ADL's : Needs assistance/impaired     Grooming: Min guard;Standing;Wash/dry hands;Wash/dry face;Oral care Grooming Details (indicate cue type and reason): able to stand at sink with one extremity support, can stand with no UE support for limited time  General ADL Comments: Patient dressed upone entry with patient stating he dressed himself  earlier    [employer] Rx Assessment              Vision       Perception     Praxis      Cognition Arousal/Alertness: Awake/alert Behavior During Therapy: WFL for tasks assessed/performed Overall Cognitive Status: Within Functional Limits for tasks assessed                                 General Comments: eager to progress        Exercises Exercises: Other exercises Other Exercises Other Exercises: Wheel chair pushups and squats from RW    Shoulder Instructions       General Comments      Pertinent Vitals/ Pain       Pain Assessment Pain Assessment: Faces Faces Pain Scale: Hurts little more Pain Location: back Pain Descriptors / Indicators: Discomfort, Guarding Pain Intervention(s): Limited activity within patient's tolerance, Monitored during session, Repositioned, Premedicated before session  Home Living                                          Prior Functioning/Environment              Frequency  Min 5X/week        Progress Toward Goals  OT Goals(current goals can now be found in the care plan section)  Progress towards OT goals: Progressing toward goals  Acute Rehab OT Goals Patient Stated Goal: go home as independent as possible OT Goal Formulation: With patient Time For Goal Achievement: 10/24/22 Potential to Achieve Goals: Good ADL Goals Pt Will Perform Grooming: Independently;standing Pt Will Perform Lower Body Bathing: with modified independence;sit to/from stand;with adaptive equipment Pt Will Perform Lower Body Dressing: with modified independence;sit to/from stand;with adaptive equipment Pt Will Transfer to Toilet: with modified independence;ambulating Pt Will Perform Toileting - Clothing Manipulation and hygiene: with modified independence;sit to/from stand Additional ADL Goal #1: Pt to demo ability to gather ADL/IADL items MOD I with most appropriate DME  Plan Discharge plan remains  appropriate;Frequency remains appropriate;Frequency needs to be updated    Co-evaluation    PT/OT/SLP Co-Evaluation/Treatment: Yes Reason for Co-Treatment: For patient/therapist safety;To address functional/ADL transfers   OT goals addressed during session: ADL's and self-care;Proper use of Adaptive equipment and DME      AM-PAC OT "6 Clicks" Daily Activity     Outcome Measure   Help from another person eating meals?: None Help from another person taking care of personal grooming?: A Little Help from another person toileting, which includes using toliet, bedpan, or urinal?: A Little Help from another person bathing (including washing, rinsing, drying)?: A Little Help from another person to put on and taking off regular upper body clothing?: A Little Help from another person to put on and taking off regular lower body clothing?: A Little 6 Click Score: 19    End of Session Equipment Utilized During Treatment: Gait belt;Rolling walker (2 wheels);Back brace  OT Visit Diagnosis: Unsteadiness on feet (R26.81);Other abnormalities of gait and mobility (R26.89);Muscle weakness (generalized) (M62.81);Pain   Activity Tolerance Patient tolerated treatment well   Patient Left in bed;with call bell/phone within reach   Nurse Communication Mobility status        Time: 1610-9604 OT Time  Calculation (min): 62 min  Charges: OT General Charges $OT Visit: 1 Visit OT Treatments $Self Care/Home Management : 8-22 mins $Therapeutic Activity: 8-22 mins  [name], OTA Acute Rehabilitation Services  Office [phone]   Dewain Penning 10/17/2022, 3:07 PM

## 2022-10-17 NOTE — Progress Notes (Signed)
Physical Therapy Treatment Patient Details Name: David [name] MRN: [id number] DOB: [birth year] Today's Date: 10/17/2022   History of Present Illness Pt is a 62 y.o. male who presented 08/20/22  with hypoglycemia. 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 5/28 surgical repair: T5-11 posterior/posterolateral arthrodesis  & Transpedicular decompression with partial corpectomy, left T8 & T8-9 laminectomy, bilateral facetectomy. 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 Eager to work with [employer] program. [address] drive to get back to work. Pt with decreased awareness of strengthening needed to progress mobility in a sustainable way, more focused on task completion. Pt with strong use of knee hyperextension for stability with ambulation. Worked on increased UE support and self limiting gait distance when pt begins to use hyperextension. Pt not thrilled with the technique. Also worked on eccentric muscle activation with squats. Pt continues to be a good candidate for CIR given increased resources to work on these skills. PT will continue to follow acutely.      Assistance Recommended at Discharge Frequent or constant Supervision/Assistance  If plan is discharge home, recommend the following:  Can travel by private vehicle    Assist for transportation;Assistance with cooking/housework;Direct supervision/assist for financial management;Direct supervision/assist for medications management;Help with stairs or ramp for entrance;A little help with walking and/or transfers;A little help with  bathing/dressing/bathroom   No  Equipment Recommendations  Rolling walker (2 wheels);Wheelchair cushion (measurements PT);Wheelchair (measurements PT);BSC/3in1    Recommendations for Other Services Rehab consult     Precautions / Restrictions Precautions Precautions: Fall;Back Required Braces or Orthoses: Spinal Brace Spinal Brace: Thoracolumbosacral orthotic;Applied in sitting position Restrictions Weight Bearing Restrictions: No RLE Weight Bearing: Weight bearing as tolerated     Mobility  Bed Mobility Overal bed mobility: Modified Independent Bed Mobility: Supine to Sit       Sit to supine: Supervision   General bed mobility comments: little to no adherence to back precautions twisting to get back into bed    Transfers Overall transfer level: Needs assistance Equipment used: Rolling walker (2 wheels) Transfers: Sit to/from Stand Sit to Stand: Min guard           General transfer comment: min guard to power up and for balance during transfer    Ambulation/Gait Ambulation/Gait assistance: Min guard Gait Distance (Feet): 100 Feet (PT requiring multiple rest breaks) Assistive device: Rolling walker (2 wheels) Gait Pattern/deviations: Knee hyperextension - right, Knee hyperextension - left, Step-through pattern, Narrow base of support Gait velocity: decreased Gait velocity interpretation: <1.31 ft/sec, indicative of household ambulator   General Gait Details: educated pt on use of UE to minimize amount of fatigue in LE and to keep from bilateral knee hyperextension as a way to maintain stability. pt more concerned with walking a long distance, ultimately able to self select rest breaks when he feels increase knee hyperextension       Balance Overall balance assessment: Needs assistance Sitting-balance support: Feet supported, No upper extremity supported Sitting balance-Leahy Scale: Good Sitting balance - Comments: doffedTLSO and shoes while seated on EOB    Standing balance support: Single extremity supported, Bilateral upper extremity supported,  No upper extremity supported, During functional activity Standing balance-Leahy Scale: Fair Standing balance comment: able to stand at sink with one extremity support and no UE support for limited bouts of time                            Cognition Arousal/Alertness: Awake/alert Behavior During Therapy: WFL for tasks assessed/performed Overall Cognitive Status: Within Functional Limits for tasks assessed Area of Impairment: Problem solving, Safety/judgement, Following commands                         Safety/Judgement: Decreased awareness of safety, Decreased awareness of deficits Awareness: Emergent Problem Solving: Slow processing, Difficulty sequencing General Comments: pt with decreased patience to work with in limitations of his muscle strength, preferring to walk a long distance rather than walk in a way to improve his strength and decrease wear on especially his knee joints.        Exercises Other Exercises Other Exercises: squats 2 bouts of 10, focus on eccentric control and decrease flexion Other Exercises: chair pushups 2 sets of 10    General Comments General comments (skin integrity, edema, etc.): VSS on RA      Pertinent Vitals/Pain Pain Assessment Pain Assessment: Faces Faces Pain Scale: Hurts little more Pain Location: back Pain Descriptors / Indicators: Discomfort, Guarding Pain Intervention(s): Limited activity within patient's tolerance, Monitored during session, Repositioned     PT Goals (current goals can now be found in the care plan section) Acute Rehab PT Goals PT Goal Formulation: With patient Time For Goal Achievement: 10/27/22 Potential to Achieve Goals: Good Progress towards PT goals: Progressing toward goals    Frequency    Min 5X/week      PT Plan Current plan remains appropriate    Co-evaluation PT/OT/SLP  Co-Evaluation/Treatment: Yes Reason for Co-Treatment: For patient/therapist safety;To address functional/ADL transfers PT goals addressed during session: Mobility/safety with mobility OT goals addressed during session: ADL's and self-care;Proper use of Adaptive equipment and DME      AM-PAC PT "6 Clicks" Mobility   Outcome Measure  Help needed turning from your back to your side while in a flat bed without using bedrails?: A Little Help needed moving from lying on your back to sitting on the side of a flat bed without using bedrails?: A Little Help needed moving to and from a bed to a chair (including a wheelchair)?: A Little Help needed standing up from a chair using your arms (e.g., wheelchair or bedside chair)?: A Little Help needed to walk in hospital room?: A Little Help needed climbing 3-5 steps with a railing? : A Little 6 Click Score: 18    End of Session Equipment Utilized During Treatment: Back brace;Gait belt Activity Tolerance: Patient tolerated treatment well Patient left: in chair;with call bell/phone within reach [name] Communication: Mobility status PT Visit Diagnosis: Difficulty in walking, not elsewhere classified (R26.2);Other abnormalities of gait and mobility (R26.89);Muscle weakness (generalized) (M62.81);Pain;Unsteadiness on feet (R26.81) Pain - Right/Left: Left Pain - part of body: Leg     Time: 4098-1191 PT Time Calculation (min) (ACUTE ONLY): 36 min  Charges:    $Gait Training: 8-22 mins $Therapeutic Exercise: 8-22 mins PT General Charges $$ ACUTE PT VISIT: 1 Visit                     David [name] B. Beverely Risen PT, DPT Acute Rehabilitation Services Please use secure chat or  Call  Office [phone]    Elon Alas Fleet 10/17/2022, 4:14 PM

## 2022-10-17 NOTE — Plan of Care (Signed)
Problem: Education: Goal: Understanding of CV disease, CV risk reduction, and recovery process will improve Outcome: Progressing Goal: Individualized Educational Video(s) Outcome: Progressing   Problem: Activity: Goal: Ability to return to baseline activity level will improve Outcome: Progressing   Problem: Cardiovascular: Goal: Ability to achieve and maintain adequate cardiovascular perfusion will improve Outcome: Progressing Goal: Vascular access site(s) Level 0-1 will be maintained Outcome: Progressing   Problem: Health Behavior/Discharge Planning: Goal: Ability to safely manage health-related needs after discharge will improve Outcome: Progressing   Problem: Education: Goal: Knowledge of General Education information will improve Description: Including pain rating scale, medication(s)/side effects and non-pharmacologic comfort measures Outcome: Progressing   Problem: Health Behavior/Discharge Planning: Goal: Ability to manage health-related needs will improve Outcome: Progressing   Problem: Clinical Measurements: Goal: Ability to maintain clinical measurements within normal limits will improve Outcome: Progressing Goal: Will remain free from infection Outcome: Progressing Goal: Diagnostic test results will improve Outcome: Progressing Goal: Respiratory complications will improve Outcome: Progressing Goal: Cardiovascular complication will be avoided Outcome: Progressing   Problem: Activity: Goal: Risk for activity intolerance will decrease Outcome: Progressing   Problem: Nutrition: Goal: Adequate nutrition will be maintained Outcome: Progressing   Problem: Coping: Goal: Level of anxiety will decrease Outcome: Progressing   Problem: Elimination: Goal: Will not experience complications related to bowel motility Outcome: Progressing Goal: Will not experience complications related to urinary retention Outcome: Progressing   Problem: Pain Managment: Goal:  General experience of comfort will improve Outcome: Progressing   Problem: Safety: Goal: Ability to remain free from injury will improve Outcome: Progressing   Problem: Skin Integrity: Goal: Risk for impaired skin integrity will decrease Outcome: Progressing   Problem: Education: Goal: Ability to describe self-care measures that may prevent or decrease complications (Diabetes Survival Skills Education) will improve Outcome: Progressing Goal: Individualized Educational Video(s) Outcome: Progressing   Problem: Coping: Goal: Ability to adjust to condition or change in health will improve Outcome: Progressing   Problem: Fluid Volume: Goal: Ability to maintain a balanced intake and output will improve Outcome: Progressing   Problem: Health Behavior/Discharge Planning: Goal: Ability to identify and utilize available resources and services will improve Outcome: Progressing Goal: Ability to manage health-related needs will improve Outcome: Progressing   Problem: Metabolic: Goal: Ability to maintain appropriate glucose levels will improve Outcome: Progressing   Problem: Nutritional: Goal: Maintenance of adequate nutrition will improve Outcome: Progressing Goal: Progress toward achieving an optimal weight will improve Outcome: Progressing   Problem: Skin Integrity: Goal: Risk for impaired skin integrity will decrease Outcome: Progressing   Problem: Tissue Perfusion: Goal: Adequacy of tissue perfusion will improve Outcome: Progressing   Problem: Education: Goal: Ability to verbalize activity precautions or restrictions will improve Outcome: Progressing Goal: Knowledge of the prescribed therapeutic regimen will improve Outcome: Progressing Goal: Understanding of discharge needs will improve Outcome: Progressing   Problem: Activity: Goal: Ability to avoid complications of mobility impairment will improve Outcome: Progressing Goal: Ability to tolerate increased activity  will improve Outcome: Progressing Goal: Will remain free from falls Outcome: Progressing   Problem: Bowel/Gastric: Goal: Gastrointestinal status for postoperative course will improve Outcome: Progressing   Problem: Clinical Measurements: Goal: Ability to maintain clinical measurements within normal limits will improve Outcome: Progressing Goal: Postoperative complications will be avoided or minimized Outcome: Progressing Goal: Diagnostic test results will improve Outcome: Progressing   Problem: Pain Management: Goal: Pain level will decrease Outcome: Progressing   Problem: Skin Integrity: Goal: Will show signs of wound healing Outcome: Progressing   Problem: Health Behavior/Discharge   Planning: Goal: Identification of resources available to assist in meeting health care needs will improve Outcome: Progressing   Problem: Bladder/Genitourinary: Goal: Urinary functional status for postoperative course will improve Outcome: Progressing   

## 2022-10-18 DIAGNOSIS — I10 Essential (primary) hypertension: Secondary | ICD-10-CM | POA: Diagnosis not present

## 2022-10-18 DIAGNOSIS — M4644 Discitis, unspecified, thoracic region: Secondary | ICD-10-CM | POA: Diagnosis not present

## 2022-10-18 DIAGNOSIS — D62 Acute posthemorrhagic anemia: Secondary | ICD-10-CM | POA: Diagnosis not present

## 2022-10-18 DIAGNOSIS — E119 Type 2 diabetes mellitus without complications: Secondary | ICD-10-CM | POA: Diagnosis not present

## 2022-10-18 LAB — GLUCOSE, CAPILLARY
Glucose-Capillary: 185 mg/dL — ABNORMAL HIGH (ref 70–99)
Glucose-Capillary: 228 mg/dL — ABNORMAL HIGH (ref 70–99)
Glucose-Capillary: 247 mg/dL — ABNORMAL HIGH (ref 70–99)
Glucose-Capillary: 159 mg/dL — ABNORMAL HIGH (ref 70–99)

## 2022-10-18 MED ORDER — HYDROCODONE-ACETAMINOPHEN 10-325 MG PO TABS
1.0000 | ORAL_TABLET | ORAL | Status: DC | PRN
  Administered 2022-10-18 – 2022-10-28 (×49): 1 via ORAL
  Filled 2022-10-18 (×50): qty 1

## 2022-10-18 NOTE — Progress Notes (Signed)
  Progress Note   Patient: David [name] ZOX:[id number] DOB: 17-Aug-1960 DOA: 08/20/2022     56 DOS: the patient was seen and examined on 10/18/2022       Brief hospital course: [name] is a 62 y.o. 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 source osteomyelitis of the foot (now s/p amputation) -Continue doxycycline - Continue pain medicines, fullscope nonopiate adjunct regimen, and taper opiates as able      Essential hypertension BP controlled - Continue Coreg, clonidine, furosemide, Entresto  Uncontrolled type 2 diabetes with hyperglycemia and hypoglycemia, insulin long term use Glucoses improved - Continue glargine - Continue sliding scale corrections - Continue duloxetine, Celebrex, Lyrica  Anxiety - Continue Buspar  Acute urinary retention Resolved  Chronic systolic CHF (congestive heart failure) (HCC) - Continue Entresto, Coreg, digoxin, Lasix  Pleural effusion -Chest x-ray follow up ordered for next week   PVD (peripheral vascular disease) (HCC) Pure hypercholesterolemia Persistent atrial fibrillation (HCC) Rate controlled - Continue Eliquis, Coreg, digoxin - Continue Lipitor          Subjective: No complaints, getting stronger, no nursing concerns     Physical Exam: BP 138/85 (BP Location: Right Arm)   Pulse 74   Temp 97.8 F (36.6 C) (Oral)   Resp 16   Ht 6' (1.829 m)   Wt 69.7 kg   SpO2 92%   BMI 20.85 kg/m   Adult male, sitting up in bed, no acute distress RRR, no murmurs, no peripheral edema Respiratory normal, lungs clear without rales or wheezes Attention normal, affect appropriate, strength seems 5/5 but limited by pain   Data Reviewed: Glucose  improved      Disposition: Status is: Inpatient         Author: Alberteen Sam, MD 10/18/2022 2:47 PM  For on call review [email or web address].

## 2022-10-18 NOTE — Plan of Care (Signed)
Problem: Education: Goal: Understanding of CV disease, CV risk reduction, and recovery process will improve Outcome: Progressing Goal: Individualized Educational Video(s) Outcome: Progressing   Problem: Activity: Goal: Ability to return to baseline activity level will improve Outcome: Progressing   Problem: Cardiovascular: Goal: Ability to achieve and maintain adequate cardiovascular perfusion will improve Outcome: Progressing Goal: Vascular access site(s) Level 0-1 will be maintained Outcome: Progressing   Problem: Health Behavior/Discharge Planning: Goal: Ability to safely manage health-related needs after discharge will improve Outcome: Progressing   Problem: Education: Goal: Knowledge of General Education information will improve Description: Including pain rating scale, medication(s)/side effects and non-pharmacologic comfort measures Outcome: Progressing   Problem: Health Behavior/Discharge Planning: Goal: Ability to manage health-related needs will improve Outcome: Progressing   Problem: Clinical Measurements: Goal: Ability to maintain clinical measurements within normal limits will improve Outcome: Progressing Goal: Will remain free from infection Outcome: Progressing Goal: Diagnostic test results will improve Outcome: Progressing Goal: Respiratory complications will improve Outcome: Progressing Goal: Cardiovascular complication will be avoided Outcome: Progressing   Problem: Activity: Goal: Risk for activity intolerance will decrease Outcome: Progressing   Problem: Nutrition: Goal: Adequate nutrition will be maintained Outcome: Progressing   Problem: Coping: Goal: Level of anxiety will decrease Outcome: Progressing   Problem: Elimination: Goal: Will not experience complications related to bowel motility Outcome: Progressing Goal: Will not experience complications related to urinary retention Outcome: Progressing   Problem: Pain Managment: Goal:  General experience of comfort will improve Outcome: Progressing   Problem: Safety: Goal: Ability to remain free from injury will improve Outcome: Progressing   Problem: Skin Integrity: Goal: Risk for impaired skin integrity will decrease Outcome: Progressing   Problem: Education: Goal: Ability to describe self-care measures that may prevent or decrease complications (Diabetes Survival Skills Education) will improve Outcome: Progressing Goal: Individualized Educational Video(s) Outcome: Progressing   Problem: Coping: Goal: Ability to adjust to condition or change in health will improve Outcome: Progressing   Problem: Fluid Volume: Goal: Ability to maintain a balanced intake and output will improve Outcome: Progressing   Problem: Health Behavior/Discharge Planning: Goal: Ability to identify and utilize available resources and services will improve Outcome: Progressing Goal: Ability to manage health-related needs will improve Outcome: Progressing   Problem: Metabolic: Goal: Ability to maintain appropriate glucose levels will improve Outcome: Progressing   Problem: Nutritional: Goal: Maintenance of adequate nutrition will improve Outcome: Progressing Goal: Progress toward achieving an optimal weight will improve Outcome: Progressing   Problem: Skin Integrity: Goal: Risk for impaired skin integrity will decrease Outcome: Progressing   Problem: Tissue Perfusion: Goal: Adequacy of tissue perfusion will improve Outcome: Progressing   Problem: Education: Goal: Ability to verbalize activity precautions or restrictions will improve Outcome: Progressing Goal: Knowledge of the prescribed therapeutic regimen will improve Outcome: Progressing Goal: Understanding of discharge needs will improve Outcome: Progressing   Problem: Activity: Goal: Ability to avoid complications of mobility impairment will improve Outcome: Progressing Goal: Ability to tolerate increased activity  will improve Outcome: Progressing Goal: Will remain free from falls Outcome: Progressing   Problem: Bowel/Gastric: Goal: Gastrointestinal status for postoperative course will improve Outcome: Progressing   Problem: Clinical Measurements: Goal: Ability to maintain clinical measurements within normal limits will improve Outcome: Progressing Goal: Postoperative complications will be avoided or minimized Outcome: Progressing Goal: Diagnostic test results will improve Outcome: Progressing   Problem: Pain Management: Goal: Pain level will decrease Outcome: Progressing   Problem: Skin Integrity: Goal: Will show signs of wound healing Outcome: Progressing   Problem: Health Behavior/Discharge   Planning: Goal: Identification of resources available to assist in meeting health care needs will improve Outcome: Progressing   Problem: Bladder/Genitourinary: Goal: Urinary functional status for postoperative course will improve Outcome: Progressing   

## 2022-10-18 NOTE — Progress Notes (Signed)
Physical Therapy Treatment Patient Details Name: David [name] MRN: [id number] DOB: Feb 28, 1961 Today's Date: 10/18/2022   History of Present Illness Pt is a 62 y.o. male who presented 08/20/22  with hypoglycemia. 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 5/28 surgical repair: T5-11 posterior/posterolateral arthrodesis  & Transpedicular decompression with partial corpectomy, left T8 & T8-9 laminectomy, bilateral facetectomy. 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 Ambulated in hallway with minA, and close chair follow. Pt continues to compensate for decreased LE muscle strength by hyperextending knee in stance phase. Pt with slightly better awareness of this tendency and appropriately asks to sit. Utilized handrails in hallway for strengthening exercises. Pt very focused on number of reps over proper form despite cuing. D/c plans remain appropriate. PT continues to work towards modified independence with RW.      Assistance Recommended at Discharge Frequent or constant Supervision/Assistance  If plan is discharge home, recommend the following:  Can travel by private vehicle    Assist for transportation;Assistance with cooking/housework;Direct supervision/assist for financial management;Direct supervision/assist for medications management;Help with stairs or ramp for entrance;A little help with walking and/or transfers;A little help with bathing/dressing/bathroom   No  Equipment Recommendations  Rolling walker (2 wheels);Wheelchair cushion (measurements PT);Wheelchair (measurements  PT);BSC/3in1    Recommendations for Other Services Rehab consult     Precautions / Restrictions Precautions Precautions: Fall;Back Required Braces or Orthoses: Spinal Brace Spinal Brace: Thoracolumbosacral orthotic;Applied in sitting position Restrictions Weight Bearing Restrictions: No RLE Weight Bearing: Weight bearing as tolerated     Mobility  Bed Mobility               General bed mobility comments: sitting on EoB with OT on entry    Transfers Overall transfer level: Needs assistance Equipment used: Rolling walker (2 wheels) Transfers: Sit to/from Stand Sit to Stand: Min guard           General transfer comment: min guard to min A for power up and steadying depending on  fatigue level in LE    Ambulation/Gait Ambulation/Gait assistance: Min guard Gait Distance (Feet): 75 Feet Assistive device: Rolling walker (2 wheels) Gait Pattern/deviations: Knee hyperextension - right, Knee hyperextension - left, Step-through pattern, Narrow base of support Gait velocity: decreased Gait velocity interpretation: <1.8 ft/sec, indicate of risk for recurrent falls   General Gait Details: better awareness to stop advancement when knees buckle and he attempts to utilize knee hyperextension to steady       Balance Overall balance assessment: Needs assistance Sitting-balance support: Feet supported, No upper extremity supported Sitting balance-Leahy Scale: Good Sitting balance - Comments: able to donn TLSO brace and T-shirt seated on EOB   Standing balance support: Single extremity supported, Bilateral upper extremity supported, No upper extremity supported, During functional activity Standing balance-Leahy Scale: Fair Standing balance comment: Stood at sink for grooming and LE exercises                            Cognition Arousal/Alertness: Awake/alert Behavior During Therapy: WFL for tasks assessed/performed Overall Cognitive Status: Within Functional  Limits for tasks assessed Area of Impairment: Problem solving, Safety/judgement, Following commands                         Safety/Judgement: Decreased awareness of safety, Decreased awareness of deficits Awareness: Emergent Problem Solving: Slow processing, Difficulty sequencing General Comments: continues to have decreased understanding of muscle fatigue, focusing mainly on number of reps or distance of ambulation over safety        Exercises General Exercises - Upper Extremity Chair Push Up: Both, 15 reps Other Exercises Other Exercises: squats from chair facing wall with use of handrail in hallway to minimize forward lean, 3 bouts of 3, vc for knees not coming to far forward when lowering down Other Exercises: lateral stepping facing wall using hand rail, 2 bouts each way ~15 ft Other Exercises: calf raises 3 bouts of 15    General Comments  VSS on RA      Pertinent Vitals/Pain Pain Assessment Pain Assessment: Faces Faces Pain Scale: Hurts little more Pain Location: back and BLEs after standing exercises Pain Descriptors / Indicators: Discomfort, Guarding Pain Intervention(s): Limited activity within patient's tolerance, Monitored during session, Repositioned     PT Goals (current goals can now be found in the care plan section) Acute Rehab PT Goals PT Goal Formulation: With patient Time For Goal Achievement: 10/27/22 Potential to Achieve Goals: Good Progress towards PT goals: Progressing toward goals    Frequency    Min 5X/week      PT Plan Current plan remains appropriate    Co-evaluation   Reason for Co-Treatment: For patient/therapist safety;To address functional/ADL transfers   OT goals addressed during session: ADL's and self-care;Proper use of Adaptive equipment and DME      AM-PAC PT "6 Clicks" Mobility   Outcome Measure  Help needed turning from your back to your side while in a flat bed without using bedrails?: A Little Help needed  moving from lying on your back to sitting on the side of a flat bed without using bedrails?: A Little Help needed moving to and from a bed to a chair (including a wheelchair)?: A Little Help needed standing up from a chair using your arms (e.g., wheelchair or bedside chair)?: A Little Help needed to walk in hospital room?: A Little Help needed climbing 3-5 steps with a railing? : A Little 6 Click Score: 18    End of Session Equipment Utilized During Treatment: Back brace;Gait belt Activity Tolerance: Patient tolerated treatment well Patient left: in chair;with call bell/phone within reach Nurse Communication: Mobility status PT Visit Diagnosis: Difficulty in walking, not elsewhere classified (R26.2);Other abnormalities of gait and mobility (R26.89);Muscle weakness (generalized) (M62.81);Pain;Unsteadiness on feet (R26.81) Pain - Right/Left: Left Pain - part of body: Leg     Time: 8295-6213 PT Time Calculation (min) (ACUTE ONLY): 97 min  Charges:    $Gait Training: 8-22 mins $Therapeutic Exercise: 23-37 mins PT General Charges $$ ACUTE PT VISIT: 1 Visit                     Taavi Hoose B. Beverely Risen PT, DPT Acute Rehabilitation Services Please use secure chat or  Call Office [phone]    Elon Alas [facility] 10/18/2022, 4:13 PM

## 2022-10-18 NOTE — Progress Notes (Signed)
Occupational Therapy Treatment Patient Details Name: David [name] MRN: [id number] DOB: 01-26-1961 Today's Date: 10/18/2022   History of present illness Pt is a 62 y.o. male who presented 08/20/22  with hypoglycemia. 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 5/28 surgical repair: T5-11 posterior/posterolateral arthrodesis  & Transpedicular decompression with partial corpectomy, left T8 & T8-9 laminectomy, bilateral facetectomy. 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 Patient able to perform UB dressing and donning back brace with supervision seated on EOB and stands at sink for grooming tasks with min guard assist. Patient performed functional mobility, LE exercises at hall rail, wheelchair push ups, and weighted bar exercises to increase functional strength to increase safety with mobility. Acute OT to continue to follow with [facility] program to progress towards increased independence with self care and mobility.    Recommendations for follow up therapy are one component of a multi-disciplinary discharge planning process, led by the attending physician.  Recommendations may be updated based on patient status, additional functional criteria and insurance authorization.    Assistance Recommended at Discharge Intermittent Supervision/Assistance  Patient can return home with the following  A little help with walking and/or transfers;A little help with bathing/dressing/bathroom;Assistance with cooking/housework;Assist for transportation;Help with stairs or ramp for entrance    Equipment Recommendations  BSC/3in1;Wheelchair (measurements OT);Wheelchair cushion (measurements OT);Other (comment) (RW)    Recommendations for Other Services      Precautions / Restrictions Precautions Precautions: Fall;Back Required Braces or Orthoses: Spinal Brace Spinal Brace: Thoracolumbosacral orthotic;Applied in sitting position Restrictions Weight Bearing Restrictions: No RLE Weight Bearing: Weight bearing as tolerated       Mobility Bed Mobility Overal bed mobility: Modified Independent Bed Mobility: Supine to Sit     Supine to sit: Modified independent (Device/Increase time), HOB elevated     General bed mobility comments: uses bed rails to assist    Transfers Overall transfer level: Needs assistance Equipment used: Rolling walker (2 wheels) Transfers: Sit to/from Stand Sit to Stand: Min guard           General transfer comment: min guard to power     Balance Overall balance assessment: Needs assistance Sitting-balance support: Feet supported, No upper extremity supported Sitting balance-Leahy Scale: Good Sitting balance - Comments: able to donn TLSO brace and T-shirt seated on EOB   Standing balance support: Single extremity supported, Bilateral upper extremity supported, No upper extremity supported, During functional activity Standing balance-Leahy Scale: Fair Standing balance comment: Stood at sink for grooming and LE exercises                           ADL either performed or assessed with clinical judgement   ADL Overall ADL's : Needs assistance/impaired     Grooming: Min guard;Standing;Wash/dry hands;Wash/dry face;Oral care Grooming Details (indicate cue type and reason): stood at sink for grooming and shaving head with electric razor         Upper Body Dressing : Set up;Sitting Upper Body Dressing Details (indicate cue type and reason): donned T-shirt and TLSO brace  Extremity/Trunk  Assessment              Vision       Perception     Praxis      Cognition Arousal/Alertness: Awake/alert Behavior During Therapy: WFL for tasks assessed/performed Overall Cognitive Status: Within Functional Limits for tasks assessed Area of Impairment: Problem solving, Safety/judgement, Following commands                         Safety/Judgement: Decreased awareness of safety, Decreased awareness of deficits Awareness: Emergent Problem Solving: Slow processing, Difficulty sequencing          Exercises Exercises: General Upper Extremity General Exercises - Upper Extremity Shoulder Flexion: Strengthening, Both, 10 reps, Seated, Bar weights/barbell Bar Weights/Barbell (Shoulder Flexion): 5 lbs Shoulder Horizontal ABduction: Strengthening, 15 reps, Seated, Bar weights/barbell Bar Weights/Barbell (Shoulder Horizontal Abduction): 5 lbs Elbow Flexion: Strengthening, Both, 15 reps, Seated, Bar weights/barbell Bar Weights/Barbell (Elbow Flexion): 5 lbs Chair Push Up: Both, 15 reps    Shoulder Instructions       General Comments      Pertinent Vitals/ Pain       Pain Assessment Pain Assessment: Faces Faces Pain Scale: Hurts little more Pain Location: back and BLEs after standing exercises Pain Descriptors / Indicators: Discomfort, Guarding Pain Intervention(s): Monitored during session  Home Living                                          Prior Functioning/Environment              Frequency  Min 5X/week        Progress Toward Goals  OT Goals(current goals can now be found in the care plan section)  Progress towards OT goals: Progressing toward goals  Acute Rehab OT Goals Patient Stated Goal: go home OT Goal Formulation: With patient Time For Goal Achievement: 10/24/22 Potential to Achieve Goals: Good ADL Goals Pt Will Perform Grooming: Independently;standing Pt Will Perform Lower Body Bathing: with modified  independence;sit to/from stand;with adaptive equipment Pt Will Perform Lower Body Dressing: with modified independence;sit to/from stand;with adaptive equipment Pt Will Transfer to Toilet: with modified independence;ambulating Pt Will Perform Toileting - Clothing Manipulation and hygiene: with modified independence;sit to/from stand Additional ADL Goal #1: Pt to demo ability to gather ADL/IADL items MOD I with most appropriate DME  Plan Discharge plan remains appropriate;Frequency remains appropriate;Frequency needs to be updated    Co-evaluation    PT/OT/SLP Co-Evaluation/Treatment: Yes Reason for Co-Treatment: For patient/therapist safety;To address functional/ADL transfers   OT goals addressed during session: ADL's and self-care;Proper use of Adaptive equipment and DME      AM-PAC OT "6 Clicks" Daily Activity     Outcome Measure   Help from another person eating meals?: None Help from another person taking care of personal grooming?: A Little Help from another person toileting, which includes using toliet, bedpan, or urinal?: A Little Help from another person bathing (including washing, rinsing, drying)?: A Little Help from another person to put on and taking off regular upper body clothing?: A Little Help from another person to put on and taking off regular lower body clothing?: A Little 6 Click Score: 19    End of Session Equipment Utilized During Treatment: Gait belt;Rolling walker (2 wheels);Back brace  OT Visit Diagnosis: Unsteadiness on feet (R26.81);Other abnormalities of gait and mobility (R26.89);Muscle weakness (generalized) (  M62.81);Pain   Activity Tolerance Patient tolerated treatment well   Patient Left in chair;with call bell/phone within reach   Nurse Communication Mobility status        Time: 5409-8119 OT Time Calculation (min): 62 min  Charges: OT General Charges $OT Visit: 1 Visit OT Treatments $Self Care/Home Management : 8-22 mins $Therapeutic  Exercise: 8-22 mins  [name], OTA Acute Rehabilitation Services  Office [phone]   Dewain Penning 10/18/2022, 3:01 PM

## 2022-10-19 DIAGNOSIS — E119 Type 2 diabetes mellitus without complications: Secondary | ICD-10-CM | POA: Diagnosis not present

## 2022-10-19 DIAGNOSIS — M4644 Discitis, unspecified, thoracic region: Secondary | ICD-10-CM | POA: Diagnosis not present

## 2022-10-19 DIAGNOSIS — I10 Essential (primary) hypertension: Secondary | ICD-10-CM | POA: Diagnosis not present

## 2022-10-19 DIAGNOSIS — D62 Acute posthemorrhagic anemia: Secondary | ICD-10-CM | POA: Diagnosis not present

## 2022-10-19 LAB — GLUCOSE, CAPILLARY
Glucose-Capillary: 204 mg/dL — ABNORMAL HIGH (ref 70–99)
Glucose-Capillary: 212 mg/dL — ABNORMAL HIGH (ref 70–99)
Glucose-Capillary: 147 mg/dL — ABNORMAL HIGH (ref 70–99)
Glucose-Capillary: 135 mg/dL — ABNORMAL HIGH (ref 70–99)

## 2022-10-19 MED ORDER — INSULIN GLARGINE-YFGN 100 UNIT/ML ~~LOC~~ SOLN
20.0000 [IU] | Freq: Every day | SUBCUTANEOUS | Status: DC
  Administered 2022-10-19 – 2022-10-28 (×10): 20 [IU] via SUBCUTANEOUS
  Filled 2022-10-19 (×10): qty 0.2

## 2022-10-19 NOTE — Plan of Care (Signed)
Problem: Education: Goal: Understanding of CV disease, CV risk reduction, and recovery process will improve Outcome: Progressing Goal: Individualized Educational Video(s) Outcome: Progressing   Problem: Activity: Goal: Ability to return to baseline activity level will improve Outcome: Progressing   Problem: Cardiovascular: Goal: Ability to achieve and maintain adequate cardiovascular perfusion will improve Outcome: Progressing Goal: Vascular access site(s) Level 0-1 will be maintained Outcome: Progressing   Problem: Health Behavior/Discharge Planning: Goal: Ability to safely manage health-related needs after discharge will improve Outcome: Progressing   Problem: Education: Goal: Knowledge of General Education information will improve Description: Including pain rating scale, medication(s)/side effects and non-pharmacologic comfort measures Outcome: Progressing   Problem: Health Behavior/Discharge Planning: Goal: Ability to manage health-related needs will improve Outcome: Progressing   Problem: Clinical Measurements: Goal: Ability to maintain clinical measurements within normal limits will improve Outcome: Progressing Goal: Will remain free from infection Outcome: Progressing Goal: Diagnostic test results will improve Outcome: Progressing Goal: Respiratory complications will improve Outcome: Progressing Goal: Cardiovascular complication will be avoided Outcome: Progressing   Problem: Activity: Goal: Risk for activity intolerance will decrease Outcome: Progressing   Problem: Nutrition: Goal: Adequate nutrition will be maintained Outcome: Progressing   Problem: Coping: Goal: Level of anxiety will decrease Outcome: Progressing   Problem: Elimination: Goal: Will not experience complications related to bowel motility Outcome: Progressing Goal: Will not experience complications related to urinary retention Outcome: Progressing   Problem: Pain Managment: Goal:  General experience of comfort will improve Outcome: Progressing   Problem: Safety: Goal: Ability to remain free from injury will improve Outcome: Progressing   Problem: Skin Integrity: Goal: Risk for impaired skin integrity will decrease Outcome: Progressing   Problem: Education: Goal: Ability to describe self-care measures that may prevent or decrease complications (Diabetes Survival Skills Education) will improve Outcome: Progressing Goal: Individualized Educational Video(s) Outcome: Progressing   Problem: Coping: Goal: Ability to adjust to condition or change in health will improve Outcome: Progressing   Problem: Fluid Volume: Goal: Ability to maintain a balanced intake and output will improve Outcome: Progressing   Problem: Health Behavior/Discharge Planning: Goal: Ability to identify and utilize available resources and services will improve Outcome: Progressing Goal: Ability to manage health-related needs will improve Outcome: Progressing   Problem: Metabolic: Goal: Ability to maintain appropriate glucose levels will improve Outcome: Progressing   Problem: Nutritional: Goal: Maintenance of adequate nutrition will improve Outcome: Progressing Goal: Progress toward achieving an optimal weight will improve Outcome: Progressing   Problem: Skin Integrity: Goal: Risk for impaired skin integrity will decrease Outcome: Progressing   Problem: Tissue Perfusion: Goal: Adequacy of tissue perfusion will improve Outcome: Progressing   Problem: Education: Goal: Ability to verbalize activity precautions or restrictions will improve Outcome: Progressing Goal: Knowledge of the prescribed therapeutic regimen will improve Outcome: Progressing Goal: Understanding of discharge needs will improve Outcome: Progressing   Problem: Activity: Goal: Ability to avoid complications of mobility impairment will improve Outcome: Progressing Goal: Ability to tolerate increased activity  will improve Outcome: Progressing Goal: Will remain free from falls Outcome: Progressing   Problem: Bowel/Gastric: Goal: Gastrointestinal status for postoperative course will improve Outcome: Progressing   Problem: Clinical Measurements: Goal: Ability to maintain clinical measurements within normal limits will improve Outcome: Progressing Goal: Postoperative complications will be avoided or minimized Outcome: Progressing Goal: Diagnostic test results will improve Outcome: Progressing   Problem: Pain Management: Goal: Pain level will decrease Outcome: Progressing   Problem: Skin Integrity: Goal: Will show signs of wound healing Outcome: Progressing   Problem: Health Behavior/Discharge   Planning: Goal: Identification of resources available to assist in meeting health care needs will improve Outcome: Progressing   Problem: Bladder/Genitourinary: Goal: Urinary functional status for postoperative course will improve Outcome: Progressing   

## 2022-10-19 NOTE — Progress Notes (Signed)
Occupational Therapy Treatment Patient Details Name: David [name] MRN: [id number] DOB: 12/03/1960 Today's Date: 10/19/2022   History of present illness Pt is a 62 y.o. male who presented 08/20/22  with hypoglycemia. 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 5/28 surgical repair: T5-11 posterior/posterolateral arthrodesis  & Transpedicular decompression with partial corpectomy, left T8 & T8-9 laminectomy, bilateral facetectomy. 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 Patient performed squats using rail for 3 sets and performed 5, 7, 10 reps. Patient seen for bathing with shower with min assist for shower transfer and supervision for bathing seated on BSC in shower. Patient performed dressing with setup/supervision for UB dressing and min assist for LB dressing with patient using figure 4 method to reach feet and assistance for balance when pulling up clothing. Patient to continue to be followed by acute OT with [facility] program.    Recommendations for follow up therapy are one component of a multi-disciplinary discharge planning process, led by the attending physician.  Recommendations may be updated based on patient status, additional functional criteria and insurance authorization.    Assistance Recommended at Discharge Intermittent Supervision/Assistance  Patient can return home with the following  A little help with walking and/or transfers;A little help with bathing/dressing/bathroom;Assistance with cooking/housework;Assist for transportation;Help with stairs or  ramp for entrance   Equipment Recommendations  BSC/3in1;Wheelchair (measurements OT);Wheelchair cushion (measurements OT);Other (comment) (RW)    Recommendations for Other Services      Precautions / Restrictions Precautions Precautions: Fall;Back Required Braces or Orthoses: Spinal Brace Spinal Brace: Thoracolumbosacral orthotic;Applied in sitting position Other Brace: for use when ambulating Restrictions Weight Bearing Restrictions: No       Mobility Bed Mobility Overal bed mobility: Modified Independent                  Transfers Overall transfer level: Needs assistance Equipment used: Rolling walker (2 wheels) Transfers: Sit to/from Stand Sit to Stand: Min guard           General transfer comment: no physical assist to power up, increased time     Balance Overall balance assessment: Needs assistance Sitting-balance support: Feet supported, No upper extremity supported Sitting balance-Leahy Scale: Good     Standing balance support: Bilateral upper extremity supported, During functional activity, Single extremity supported Standing balance-Leahy Scale: Fair                             ADL either performed or assessed with clinical judgement   ADL Overall ADL's : Needs assistance/impaired     Grooming: Min guard;Standing;Wash/dry hands;Wash/dry face;Oral care   Upper Body Bathing: Supervision/ safety;Sitting Upper Body Bathing Details (indicate cue type and reason): in shower Lower Body Bathing: Supervison/ safety;Sitting/lateral leans Lower Body Bathing Details (indicate cue type and reason): seated on BSC in shower with lateral leaning to bathe peri area and figure four to reach feet Upper Body Dressing : Set up;Sitting Upper Body Dressing Details (indicate cue type and reason): donned T-shirt and TLSO brace Lower Body Dressing: Supervision/safety;Sitting/lateral leans;Minimal assistance;Sit to/from  stand Lower Body Dressing Details  (indicate cue type and reason): able to use figure 4 technique for LB dressing and min assist for balance for pulling up clothing Toilet Transfer: Min guard;Ambulation;Rolling walker (2 wheels)             General ADL Comments: cues for back precautions    Extremity/Trunk Assessment              Vision       Perception     Praxis      Cognition Arousal/Alertness: Awake/alert Behavior During Therapy: WFL for tasks assessed/performed Overall Cognitive Status: Within Functional Limits for tasks assessed                                 General Comments: required cues for back precautions during self care        Exercises Exercises: Other exercises Other Exercises Other Exercises: squats using rail with min guard for safety with patient performing 3 sets with 5, 7, 10 reps    Shoulder Instructions       General Comments VSS on RA    Pertinent Vitals/ Pain       Pain Assessment Pain Assessment: Faces Faces Pain Scale: No hurt Pain Intervention(s): Monitored during session  Home Living                                          Prior Functioning/Environment              Frequency  Min 5X/week        Progress Toward Goals  OT Goals(current goals can now be found in the care plan section)  Progress towards OT goals: Progressing toward goals  Acute Rehab OT Goals Patient Stated Goal: walk better OT Goal Formulation: With patient Time For Goal Achievement: 10/24/22 Potential to Achieve Goals: Good ADL Goals Pt Will Perform Grooming: Independently;standing Pt Will Perform Lower Body Bathing: with modified independence;sit to/from stand;with adaptive equipment Pt Will Perform Lower Body Dressing: with modified independence;sit to/from stand;with adaptive equipment Pt Will Transfer to Toilet: with modified independence;ambulating Pt Will Perform Toileting - Clothing Manipulation and hygiene: with modified  independence;sit to/from stand Additional ADL Goal #1: Pt to demo ability to gather ADL/IADL items MOD I with most appropriate DME  Plan Discharge plan remains appropriate;Frequency remains appropriate;Frequency needs to be updated    Co-evaluation    PT/OT/SLP Co-Evaluation/Treatment: Yes Reason for Co-Treatment: For patient/therapist safety;To address functional/ADL transfers PT goals addressed during session: Mobility/safety with mobility;Balance;Proper use of DME OT goals addressed during session: ADL's and self-care;Proper use of Adaptive equipment and DME      AM-PAC OT "6 Clicks" Daily Activity     Outcome Measure   Help from another person eating meals?: None Help from another person taking care of personal grooming?: A Little Help from another person toileting, which includes using toliet, bedpan, or urinal?: A Little Help from another person bathing (including washing, rinsing, drying)?: A Little Help from another person to put on and taking off regular upper body clothing?: A Little Help from another person to put on and taking off regular lower body clothing?: A Little 6 Click Score: 19    End of Session Equipment Utilized During Treatment: Gait belt;Rolling walker (2 wheels);Back brace  OT Visit Diagnosis: Unsteadiness on feet (R26.81);Other abnormalities  of gait and mobility (R26.89);Muscle weakness (generalized) (M62.81);Pain   Activity Tolerance Patient tolerated treatment well   Patient Left in chair;with call bell/phone within reach   Nurse Communication Mobility status        Time: 1201-1330 OT Time Calculation (min): 89 min  Charges: OT General Charges $OT Visit: 1 Visit OT Treatments $Self Care/Home Management : 38-52 mins $Therapeutic Activity: 23-37 mins  [name], OTA Acute Rehabilitation Services  Office [phone]   Dewain Penning 10/19/2022, 2:42 PM

## 2022-10-19 NOTE — Progress Notes (Signed)
Inpatient Rehab Admissions Coordinator:    Pt. States he has not been able to identify a safe d/c plan for after he completes CIR. He would rather continue in [facility] program and d/c home or to a shelter depending on whether or not he is evicted from where he's staying.   Megan Salon, MS, CCC-SLP Rehab Admissions Coordinator  [phone] (celll) [phone] (office)

## 2022-10-19 NOTE — Progress Notes (Signed)
Progress Note   Patient: David [name] ONG:[id number] DOB: Aug 16, 1960 DOA: 08/20/2022     57 DOS: the patient was seen and examined on 10/19/2022 at 9:25AM      Brief hospital course: Mr. Keziah is a 62 y.o. 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  On PCA earlier in hospital stay.  Now weaned to fentanyl patch and oxy 5-10.  Desires to wean further - Continue oxycodone, taper as able, hopes to be off opiates by discharge - Continue duloxetine - Continue acetaminophen and Cebebrex  - Continue PT in the "[facility]" program    Persistent atrial fibrillation (HCC) Rate controlled - Continue Eliquis - Continue Coreg, digoxin  Chronic systolic CHF (congestive heart failure) (HCC) EF 35-40% in 2020, now  resolved to normal Appears euvolemic here - Continue Entresto, digoxin, Coreg, Lasix  PVD (peripheral vascular disease) (HCC) S/p angioplasty in April - Continue atorvastatin, apixaban  Essential hypertension BP controlled - Continue Coreg, furosemide, Entresto, clonidine  Uncontrolled type 2 diabetes with hyperglycemia and hypoglycemia, insulin long term use Glucoses slightly high - Continue glargine, increase dose again - Continue sliding scale corrections - Continue duloxetine, Celebrex, Lyrica  Anxiety - Continue Buspar     Acute urinary retention Resolved  Pleural effusion Resolved on repeat CXR  Acute postoperative anemia due to expected blood loss Hgb trending up to 10s now.  Likely some iron deficiency contributing. - Oral iron  Acute toxic encephalopathy At baseline has no cognitive impairment.  Developed encephalopathy (poorly responsive, confused responses) here due to high doses of opiates.  Resolved now.  Hypokalemia Resolved  AKI (acute kidney injury), ruled out   Hypomagnesemia Resolved  Hyponatremia Mild, asymptomatic  Pure hypercholesterolemia - Continue Lipitor          Subjective: Patient doing well, working with physical therapy and getting exhausted.  No new pain, no fever.     Physical Exam: BP (!) 145/97 (BP Location: Right Arm)   Pulse 74   Temp 97.9 F (36.6 C) (Oral)   Resp 19   Ht 6' (1.829 m)   Wt 69.7 kg   SpO2 98%   BMI 20.85 kg/m   Adult male, lying in bed, interactive and appropriate RRR, no murmurs, no peripheral edema Respiratory normal, lungs clear without rales or wheezes  Data Reviewed: Glucose somewhat elevated  Family Communication: None present    Disposition: Status is: Inpatient The patient is medically stable, does not yet have a safe  disposition, [facility] program is working towards strengthening him so he can go home        Author: Alberteen Sam, MD 10/19/2022 5:34  PM  For on call review [email or web address].

## 2022-10-19 NOTE — Progress Notes (Signed)
Physical Therapy Treatment Patient Details Name: David [name] MRN: [id number] DOB: [birth year] Today's Date: 10/19/2022   History of Present Illness Pt is a 62 y.o. male who presented 08/20/22  with hypoglycemia. 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 5/28 surgical repair: T5-11 posterior/posterolateral arthrodesis  & Transpedicular decompression with partial corpectomy, left T8 & T8-9 laminectomy, bilateral facetectomy. 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 Seated rest break between gait trials. Decreased bilat knee hyperextension as compared to previous sessions, primarily noted during second gait trial as pt fatigued.      Assistance Recommended at Discharge Frequent or constant Supervision/Assistance  If plan is discharge home, recommend the following:  Can travel by private vehicle    Assist for transportation;Assistance with cooking/housework;Direct supervision/assist for financial management;Direct supervision/assist for medications management;Help with stairs or ramp for entrance;A little help with walking and/or transfers;A little help with bathing/dressing/bathroom   No  Equipment Recommendations  Rolling walker (2 wheels);Wheelchair cushion (measurements PT);Wheelchair (measurements PT);BSC/3in1    Recommendations for Other Services       Precautions / Restrictions Precautions Precautions: Fall;Back Required Braces or Orthoses: Spinal Brace Spinal Brace: Thoracolumbosacral orthotic;Applied in sitting position Other Brace: for use when ambulating      Mobility  Bed Mobility Overal bed mobility: Modified Independent                  Transfers Overall transfer level: Needs assistance Equipment used: Rolling walker (2 wheels) Transfers: Sit to/from Stand Sit to Stand: Min guard           General transfer comment: no physical assist to power up, increased time    Ambulation/Gait Ambulation/Gait assistance: Min guard, +2 safety/equipment Gait Distance (Feet): 75 Feet (x 2) Assistive device: Rolling walker (2 wheels) Gait Pattern/deviations: Knee hyperextension - right, Knee hyperextension - left, Step-through pattern, Narrow base of support Gait velocity: decreased Gait velocity interpretation: <1.31 ft/sec, indicative of household ambulator   General Gait Details: Seated rest break between gait trials. Hyperextension bilat knees noted with fatigue, primarily during 2nd gait trial.   Stairs             Wheelchair Mobility     Tilt Bed    Modified Rankin (Stroke Patients Only)       Balance Overall balance assessment: Needs assistance Sitting-balance support: Feet supported, No upper extremity supported Sitting balance-Leahy Scale: Good     Standing balance support: Bilateral upper extremity supported, During functional activity, Single extremity supported Standing balance-Leahy Scale: Fair                              Cognition Arousal/Alertness: Awake/alert Behavior During Therapy: WFL for tasks assessed/performed Overall Cognitive Status: Within Functional Limits for tasks assessed                                          Exercises      General Comments General  comments (skin integrity, edema, etc.): VSS on RA      Pertinent Vitals/Pain Pain Assessment Pain Assessment: Faces Faces Pain Scale: No hurt    Home Living                          Prior Function            PT Goals (current goals can now be found in the care plan section)  Acute Rehab PT Goals Patient Stated Goal: return home and drum again Progress towards PT goals: Progressing toward goals    Frequency    Min 5X/week      PT Plan Current plan remains appropriate    Co-evaluation PT/OT/SLP Co-Evaluation/Treatment: Yes Reason for Co-Treatment: For patient/therapist safety;To address functional/ADL transfers PT goals addressed during session: Mobility/safety with mobility;Balance;Proper use of DME        AM-PAC PT "6 Clicks" Mobility   Outcome Measure  Help needed turning from your back to your side while in a flat bed without using bedrails?: A Little Help needed moving from lying on your back to sitting on the side of a flat bed without using bedrails?: A Little Help needed moving to and from a bed to a chair (including a wheelchair)?: A Little Help needed standing up from a chair using your arms (e.g., wheelchair or bedside chair)?: A Little Help needed to walk in hospital room?: A Little Help needed climbing 3-5 steps with a railing? : A Little 6 Click Score: 18    End of Session Equipment Utilized During Treatment: Back brace;Gait belt Activity Tolerance: Patient tolerated treatment well Patient left: in chair;Other (comment) (with OT for transport outside) Nurse Communication: Mobility status PT Visit Diagnosis: Difficulty in walking, not elsewhere classified (R26.2);Other abnormalities of gait and mobility (R26.89);Muscle weakness (generalized) (M62.81);Pain;Unsteadiness on feet (R26.81)     Time: 3710-6269 PT Time Calculation (min) (ACUTE ONLY): 23 min  Charges:    $Gait Training: 8-22 mins PT General Charges $$ ACUTE PT VISIT: 1 Visit                     Ferd Glassing., PT  Office # [phone]    Ilda Foil 10/19/2022, 1:07 PM

## 2022-10-20 ENCOUNTER — Ambulatory Visit: Payer: Self-pay | Admitting: Internal Medicine

## 2022-10-20 DIAGNOSIS — M4644 Discitis, unspecified, thoracic region: Secondary | ICD-10-CM | POA: Diagnosis not present

## 2022-10-20 DIAGNOSIS — F419 Anxiety disorder, unspecified: Secondary | ICD-10-CM | POA: Diagnosis not present

## 2022-10-20 DIAGNOSIS — I739 Peripheral vascular disease, unspecified: Secondary | ICD-10-CM | POA: Diagnosis not present

## 2022-10-20 DIAGNOSIS — I10 Essential (primary) hypertension: Secondary | ICD-10-CM | POA: Diagnosis not present

## 2022-10-20 LAB — GLUCOSE, CAPILLARY
Glucose-Capillary: 165 mg/dL — ABNORMAL HIGH (ref 70–99)
Glucose-Capillary: 254 mg/dL — ABNORMAL HIGH (ref 70–99)
Glucose-Capillary: 161 mg/dL — ABNORMAL HIGH (ref 70–99)
Glucose-Capillary: 202 mg/dL — ABNORMAL HIGH (ref 70–99)

## 2022-10-20 MED ORDER — ZINC OXIDE 40 % EX OINT
TOPICAL_OINTMENT | Freq: Two times a day (BID) | CUTANEOUS | Status: DC
  Filled 2022-10-20: qty 57

## 2022-10-20 NOTE — Plan of Care (Signed)
  Problem: Coping: Goal: Ability to adjust to condition or change in health will improve Outcome: Progressing   Problem: Skin Integrity: Goal: Risk for impaired skin integrity will decrease Outcome: Progressing   Problem: Tissue Perfusion: Goal: Adequacy of tissue perfusion will improve Outcome: Progressing   Problem: Activity: Goal: Will remain free from falls Outcome: Progressing   Problem: Skin Integrity: Goal: Will show signs of wound healing Outcome: Progressing

## 2022-10-20 NOTE — Plan of Care (Signed)
  Problem: Education: Goal: Understanding of CV disease, CV risk reduction, and recovery process will improve Outcome: Progressing Goal: Individualized Educational Video(s) Outcome: Progressing   Problem: Activity: Goal: Ability to return to baseline activity level will improve Outcome: Progressing   

## 2022-10-20 NOTE — Progress Notes (Signed)
Occupational Therapy Treatment Patient Details Name: David [name] MRN: [id number] DOB: January 07, 1961 Today's Date: 10/20/2022   History of present illness Pt is a 62 y.o. male who presented 08/20/22  with hypoglycemia. 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 5/28 surgical repair: T5-11 posterior/posterolateral arthrodesis  & Transpedicular decompression with partial corpectomy, left T8 & T8-9 laminectomy, bilateral facetectomy. 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 Patient able to stand at sink for grooming tasks leaning on sink for support. Patient performed mobility, chair pushups, squats, and side stepping to increase BLE strength to increase safety with mobility and transfers. Discussed possibility of OPOT for continued therapy. Acute OT to continue to follow with STAR program.    Recommendations for follow up therapy are one component of a multi-disciplinary discharge planning process, led by the attending physician.  Recommendations may be updated based on patient status, additional functional criteria and insurance authorization.    Assistance Recommended at Discharge Intermittent Supervision/Assistance  Patient can return home with the following  A little help with walking and/or transfers;A little help with bathing/dressing/bathroom;Assistance with cooking/housework;Assist for transportation;Help with stairs or ramp for entrance   Equipment Recommendations  BSC/3in1;Wheelchair (measurements OT);Wheelchair cushion (measurements  OT);Other (comment) (RW)    Recommendations for Other Services      Precautions / Restrictions Precautions Precautions: Fall;Back Precaution Booklet Issued: No Required Braces or Orthoses: Spinal Brace Spinal Brace: Thoracolumbosacral orthotic;Applied in sitting position Other Brace: for use when ambulating Restrictions Weight Bearing Restrictions: No RLE Weight Bearing: Weight bearing as tolerated       Mobility Bed Mobility Overal bed mobility: Modified Independent             General bed mobility comments: OOB upon entry    Transfers Overall transfer level: Needs assistance Equipment used: Rolling walker (2 wheels) Transfers: Sit to/from Stand Sit to Stand: Min guard           General transfer comment: min guard to power up and for safety with transfers     Balance Overall balance assessment: Needs assistance Sitting-balance support: Feet supported, No upper extremity supported Sitting balance-Leahy Scale: Good     Standing balance support: Single extremity supported, Bilateral upper extremity supported, During functional activity Standing balance-Leahy Scale: Fair Standing balance comment: patient relies on external support for balance                           ADL either performed or assessed with clinical judgement   ADL Overall ADL's : Needs assistance/impaired     Grooming: Min guard;Standing;Wash/dry hands;Wash/dry face;Oral care Grooming Details (indicate cue type and reason): stood at sink with support of sink for balance                 Toilet Transfer: Min guard;Ambulation;Rolling walker (2 wheels)   Toileting- Clothing Manipulation and Hygiene: Min guard;Sit to/from stand Toileting - Clothing Manipulation Details (indicate cue type and reason): to perform clothing management       General ADL Comments: patient on toilet upon  entry and provided min guard to stand from toilet and for clothing managment     Extremity/Trunk Assessment              Vision       Perception     Praxis      Cognition Arousal/Alertness: Awake/alert Behavior During Therapy: WFL for tasks assessed/performed Overall Cognitive Status: Within Functional Limits for tasks assessed Area of Impairment: Problem solving, Safety/judgement, Following commands                         Safety/Judgement: Decreased awareness of safety, Decreased awareness of deficits Awareness: Emergent   General Comments: appears discouraged over progress        Exercises Exercises: Other exercises General Exercises - Upper Extremity Chair Push Up: Both, 15 reps Other Exercises Other Exercises: squats performed from hall rail Other Exercises: side stepping at hall rail    Shoulder Instructions       General Comments      Pertinent Vitals/ Pain       Pain Assessment Pain Assessment: Faces Faces Pain Scale: Hurts a little bit Pain Location: back and BLEs after standing exercises Pain Descriptors / Indicators: Discomfort, Guarding Pain Intervention(s): Limited activity within patient's tolerance, Monitored during session, Premedicated before session, Repositioned  Home Living                                          Prior Functioning/Environment              Frequency  Min 5X/week        Progress Toward Goals  OT Goals(current goals can now be found in the care plan section)  Progress towards OT goals: Progressing toward goals  Acute Rehab OT Goals Patient Stated Goal: walk without the walker OT Goal Formulation: With patient Time For Goal Achievement: 10/24/22 Potential to Achieve Goals: Good ADL Goals Pt Will Perform Grooming: Independently;standing Pt Will Perform Lower Body Bathing: with modified independence;sit to/from stand;with adaptive equipment Pt Will Perform Lower Body Dressing: with modified independence;sit to/from stand;with adaptive equipment Pt Will  Transfer to Toilet: with modified independence;ambulating Pt Will Perform Toileting - Clothing Manipulation and hygiene: with modified independence;sit to/from stand Additional ADL Goal #1: Pt to demo ability to gather ADL/IADL items MOD I with most appropriate DME  Plan Discharge plan remains appropriate;Frequency remains appropriate;Frequency needs to be updated    Co-evaluation    PT/OT/SLP Co-Evaluation/Treatment: Yes Reason for Co-Treatment: For patient/therapist safety;To address functional/ADL transfers   OT goals addressed during session: ADL's and self-care      AM-PAC OT "6 Clicks" Daily Activity     Outcome Measure   Help from another person eating meals?: None Help from another person taking care of personal grooming?: A Little Help from another person toileting, which includes using toliet, bedpan, or urinal?: A Little Help from another person bathing (including washing, rinsing, drying)?: A Little Help from another person to put on and taking off regular upper body clothing?: A Little Help from another person to put on and taking off regular lower body clothing?: A Little 6 Click Score: 19    End of Session Equipment Utilized During Treatment: Gait belt;Rolling walker (2 wheels);Back brace  OT Visit Diagnosis: Unsteadiness on feet (R26.81);Other abnormalities of gait and mobility (R26.89);Muscle weakness (generalized) (M62.81);Pain Pain - Right/Left: Left (and right) Pain -  part of body: Leg   Activity Tolerance Patient tolerated treatment well   Patient Left in chair;with call bell/phone within reach   Nurse Communication Mobility status        Time: 1418-1510 OT Time Calculation (min): 52 min  Charges: OT General Charges $OT Visit: 1 Visit OT Treatments $Self Care/Home Management : 8-22 mins $Therapeutic Activity: 8-22 mins  [name], OTA Acute Rehabilitation Services  Office [phone]   Dewain Penning 10/20/2022, 3:32 PM

## 2022-10-20 NOTE — Progress Notes (Signed)
Physical Therapy Treatment Patient Details Name: David [name] MRN: [id number] DOB: September 24, 1960 Today's Date: 10/20/2022   History of Present Illness Pt is a 62 y.o. male who presented 08/20/22  with hypoglycemia. 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 5/28 surgical repair: T5-11 posterior/posterolateral arthrodesis  & Transpedicular decompression with partial corpectomy, left T8 & T8-9 laminectomy, bilateral facetectomy. 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 Pt reports wanting to walk independently of RW. PT/OT report that is not likely to happen soon Discussed that acute rehab is not really equipped with resources needed to progress ambulation like parallel bars. But that he could go home being independent at a wheelchair level. but  and then continue to work on progressing ambulation with OP PT. Pt worked on ambulation during session and continues to have difficulty offweighting LE with increased UE support and has bilateral knee buckling L> R with fatigue. Performed body weight exercises. Pt preoccupied with finishing "sets of 10" with decreased concern for break in form even though he can report when it happens. PT discussed possibility of wheelchair level discharge with CM/SW and she reports she can have wheelchair delivered by morning. Will work on discharge planning. PT returned to pt and discussed possibility of discharge tomorrow. Pt reports that he and wife are moving tomorrow but that he should be able to go on Sunday. MD to order prescriptions from [facility] to be ready for  discharge on Sunday. STAR session tomorrow to focus on wheelchair transfers and mobility as well as education on HEP to be used until pt able to go to OP PT.     Assistance Recommended at Discharge Frequent or constant Supervision/Assistance (for ambulation)  If plan is discharge home, recommend the following:  Can travel by private vehicle    A little help with walking and/or transfers;A little help with bathing/dressing/bathroom;Assistance with cooking/housework;Direct supervision/assist for medications management;Assist for transportation;Help with stairs or ramp for entrance   Yes  Equipment Recommendations  Wheelchair (measurements PT);Wheelchair cushion (measurements PT)       Precautions / Restrictions Precautions Precautions: Fall;Back Precaution Booklet Issued: No Required Braces or Orthoses: Spinal Brace Spinal Brace: Thoracolumbosacral orthotic;Applied in sitting position Other Brace: for use when ambulating Restrictions Weight Bearing Restrictions: No RLE Weight Bearing: Weight bearing as tolerated     Mobility  Bed Mobility Overal bed mobility: Modified Independent             General bed mobility comments: OOB upon entry    Transfers Overall transfer level: Needs assistance Equipment used: Rolling walker (2 wheels) Transfers: Sit to/from Stand Sit to Stand: Min guard           General transfer comment: min guard to power up and for safety with transfers    Ambulation/Gait Ambulation/Gait assistance: Min guard, +2 safety/equipment Gait Distance (Feet): 75 Feet Assistive device: Rolling walker (2 wheels) Gait Pattern/deviations: Knee hyperextension - right, Knee hyperextension - left, Step-through pattern, Narrow base of support Gait velocity: decreased Gait velocity interpretation: <1.31 ft/sec, indicative of household ambulator   General Gait Details: seated rest breaks to recover  when muscles fatigue and pt requiring knee hyperextension for  stability, when asked pt reports knowing that he is hyperextending for support, discussed that increased use of UE support and triceps activation to decrease weightbearing and knee buckling, pt verbalizes understanding but has decrease carryover to performance         Balance Overall balance assessment: Needs assistance Sitting-balance support: Feet supported, No upper extremity supported Sitting balance-Leahy Scale: Good     Standing balance support: Single extremity supported, Bilateral upper extremity supported, During functional activity Standing balance-Leahy Scale: Fair Standing balance comment: patient relies on external support for balance                            Cognition Arousal/Alertness: Awake/alert Behavior During Therapy: WFL for tasks assessed/performed Overall Cognitive Status: Within Functional Limits for tasks assessed Area of Impairment: Problem solving, Safety/judgement, Following commands                         Safety/Judgement: Decreased awareness of safety, Decreased awareness of deficits Awareness: Emergent   General Comments: discouraged with progress, desperately wants to be independent with ambulation        Exercises General Exercises - Upper Extremity Chair Push Up: Both, 15 reps Other Exercises Other Exercises: squats performed from hall rail Other Exercises: side stepping at hall rail Other Exercises: calf raises 3 bouts of 15    General Comments General comments (skin integrity, edema, etc.): long conversation about discharge at wheelchair level for independence when wife is not present and continued work on strengthening using walker and wheelchair follow when wife is present      Pertinent Vitals/Pain Pain Assessment Pain Assessment: Faces Faces Pain Scale: Hurts a little bit Pain Location: back and BLEs after standing exercises Pain Descriptors / Indicators: Discomfort, Guarding Pain Intervention(s): Limited  activity within patient's tolerance, Monitored during session, Repositioned     PT Goals (current goals can now be found in the care plan section) Acute Rehab PT Goals PT Goal Formulation: With patient Time For Goal Achievement: 10/27/22 Potential to Achieve Goals: Good Progress towards PT goals: Progressing toward goals    Frequency    Min 5X/week      PT Plan Discharge plan needs to be updated    Co-evaluation PT/OT/SLP Co-Evaluation/Treatment: Yes Reason for Co-Treatment: For patient/therapist safety;To address functional/ADL transfers PT goals addressed during session: Mobility/safety with mobility;Balance;Proper use of DME OT goals addressed during session: ADL's and self-care      AM-PAC PT "6 Clicks" Mobility   Outcome Measure  Help needed turning from your back to your side while in a flat bed without using bedrails?: A Little Help needed moving from lying on your back to sitting on the side of a flat bed without using bedrails?: A Little Help needed moving to and from a bed to a chair (including a wheelchair)?: A Little Help needed standing up from a chair using your arms (e.g., wheelchair or bedside chair)?: A Little Help needed to walk in hospital room?: A Little Help needed climbing 3-5 steps with a railing? : A Little 6 Click Score: 18    End of Session Equipment Utilized During Treatment: Back brace;Gait belt Activity Tolerance: Patient tolerated treatment well Patient left: in chair;with call bell/phone within reach Nurse Communication: Mobility status PT Visit Diagnosis: Difficulty in walking, not elsewhere classified (R26.2);Other abnormalities of gait and mobility (R26.89);Muscle weakness (generalized) (M62.81);Pain;Unsteadiness on feet (R26.81)  Pain - Right/Left: Left Pain - part of body: Leg     Time: 1610-9604 PT Time Calculation (min) (ACUTE ONLY): 77 min  Charges:    $Gait Training: 8-22 mins $Therapeutic Exercise: 8-22 mins $Therapeutic  Activity: 8-22 mins PT General Charges $$ ACUTE PT VISIT: 1 Visit                     Champayne Kocian B. Beverely Risen PT, DPT Acute Rehabilitation Services Please use secure chat or  Call Office [phone]    Elon Alas [facility] 10/20/2022, 5:29 PM

## 2022-10-20 NOTE — Progress Notes (Signed)
[name]                                                                               David [name], is a 62 y.o. male, DOB - 05/17/1960, WUJ:[id number] Admit date - 08/20/2022    Outpatient Primary MD for the patient is David Register, MD  LOS - 58  days    Brief summary   David [name] is a 62 y.o. 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  On PCA earlier in hospital stay.  Now weaned to fentanyl patch and oxy 5-10.  Desires to wean further - Continue fentanyl, decrease to 25 mcg/hr patch - Continue oxycodone, taper as able - Continue duloxetine - Continue acetaminophen and Cebebrex      Essential hypertension BP controlled - Continue Coreg, furosemide,  Entresto, clonidine  Uncontrolled type 2 diabetes with hyperglycemia and hypoglycemia, insulin long term use Glucoses controlled - Continue glargine - Continue sliding scale corrections - Continue duloxetine, Celebrex, Lyrica  Anxiety - Continue Buspar  Acute urinary retention Acute urinary retention here, not relieved with positioning.  No constipation.  Foley replaced 6/25 - Continue Flomax and finasteride - Repeat voiding trial around Jul 1  Chronic systolic CHF (congestive heart failure) (HCC) EF 35-40% in 2020, now resolved to normal Appears euvolemic here - Continue Entresto, digoxin, Coreg, Lasix  Pleural effusion Stable, asymptomatic. - Repeat CXR in 4-6 weeks, mid July  Acute postoperative anemia due to expected blood loss Hgb trending up to 10s now.  Likely some iron deficiency contributing. - Oral iron  Acute toxic encephalopathy At baseline has no cognitive impairment.  Developed encephalopathy (poorly responsive, confused responses) here due to high doses of opiates.  Resolved now.  Hypokalemia Resolved  AKI (acute kidney injury), ruled out    PVD (peripheral vascular disease) (HCC) S/p angioplasty in April - Continue atorvastatin, apixaban  Hypomagnesemia Resolved  Hyponatremia Mild, asymptomatic  Pure hypercholesterolemia - Continue Lipitor  Persistent atrial fibrillation (HCC) Rate controlled - Continue Eliquis - Continue Coreg, digoxin     Estimated body mass index is 20.85 kg/m as calculated from the  following:   Height as of this encounter: 6' (1.829 m).   Weight as of this encounter: 69.7 kg.  Code Status: FULL CODE.  DVT Prophylaxis:  SCD's Start: 08/30/22 1939 apixaban (ELIQUIS) tablet 5 mg   Level of Care: Level of care: Med-Surg Family Communication: none at bedside.   Disposition Plan:     Remains inpatient appropriate:  \pending.   Procedures:  None.   Consultants:   Palliative care Psychiatry.  ID.   Antimicrobials:   Anti-infectives (From admission, onward)    Start     Dose/Rate Route Frequency Ordered Stop   10/08/22 1400  Urelle (URELLE/URISED) 81 MG tablet 81 mg        1 tablet Oral 4 times daily 10/08/22 1140 10/11/22 1359   09/28/22 1000  doxycycline (VIBRA-TABS) tablet 100 mg        100 mg Oral Every 12 hours 09/01/22 1113     08/31/22 2000  DAPTOmycin (CUBICIN) 650 mg in sodium chloride 0.9 % IVPB        8 mg/kg  83.3 kg 126 mL/hr over 30 Minutes Intravenous Daily 08/31/22 1053 09/27/22 1432   08/30/22 2300  vancomycin (VANCOREADY) IVPB 750 mg/150 mL  Status:  Discontinued        750 mg 150 mL/hr over 60 Minutes Intravenous Every 12 hours 08/30/22 1150 08/31/22 1053   08/30/22 1712  vancomycin (VANCOCIN) powder  Status:  Discontinued          As needed 08/30/22 1714 08/30/22 1756   08/30/22 1142  ceFAZolin (ANCEF) 2-4 GM/100ML-% IVPB  Status:  Discontinued       Note to Pharmacy: [facility], [place]: cabinet override      08/30/22 1142 08/30/22 1300   08/30/22 0600  ceFAZolin (ANCEF) IVPB 2g/100 mL premix        2 g 200 mL/hr over 30 Minutes Intravenous On call to O.R. 08/29/22 1848 08/30/22 1401   08/27/22 1041  vancomycin (VANCOCIN) IVPB 1000 mg/200 mL premix  Status:  Discontinued        1,000 mg 200 mL/hr over 60 Minutes Intravenous Every 12 hours 08/27/22 0802 08/30/22 1150   08/26/22 2300  vancomycin (VANCOREADY) IVPB 1250 mg/250 mL  Status:  Discontinued        1,250 mg 166.7 mL/hr over 90 Minutes Intravenous Every 24 hours 08/26/22 1014 08/27/22 0802   08/23/22 2200  vancomycin (VANCOREADY) IVPB 750 mg/150 mL  Status:  Discontinued        750 mg 150 mL/hr over 60 Minutes Intravenous Every 12 hours 08/23/22 0938 08/26/22 1014   08/23/22 1030  vancomycin (VANCOREADY) IVPB 1500 mg/300 mL        1,500 mg 150 mL/hr over 120 Minutes Intravenous  Once 08/23/22 0938 08/23/22 1312   08/20/22 2200  doxycycline (VIBRA-TABS) tablet 100 mg  Status:  Discontinued        100 mg  Oral 2 times daily 08/20/22 1737 08/23/22 0927        Medications  Scheduled Meds:  acetaminophen  1,000 mg Oral TID   apixaban  5 mg Oral BID   atorvastatin  80 mg Oral Daily   busPIRone  15 mg Oral BID   carvedilol  3.125 mg Oral BID   celecoxib  100 mg Oral BID   digoxin  125 mcg Oral Daily   doxycycline  100 mg Oral Q12H   DULoxetine  20 mg Oral Daily   famotidine  20 mg Oral BID   feeding supplement  237 mL Oral BID BM   ferrous sulfate  325 mg Oral QODAY   finasteride  5 mg Oral Daily   fluticasone  1 spray Each Nare Daily   furosemide  20 mg Oral Daily   insulin aspart  0-5 Units Subcutaneous QHS   insulin aspart  0-9 Units Subcutaneous TID WC   insulin glargine-yfgn  20 Units Subcutaneous Daily   lidocaine  1 patch Transdermal Q24H   magnesium oxide  400 mg Oral BID   pantoprazole  40 mg Oral Daily   polyethylene glycol  17 g Oral BID   pregabalin  75 mg Oral QHS   sacubitril-valsartan  1 tablet Oral BID   senna-docusate  1 tablet Oral BID   sodium chloride flush  3 mL Intravenous Q12H   tamsulosin  0.4 mg Oral QPC supper   Continuous Infusions:  sodium chloride 250 mL (09/25/22 1625)   sodium chloride Stopped (09/15/22 1353)   PRN Meds:.sodium chloride, bisacodyl, cloNIDine, HYDROcodone-acetaminophen, HYDROmorphone (DILAUDID) injection, methocarbamol, ondansetron **OR** ondansetron (ZOFRAN) IV, sodium chloride, sodium chloride flush, sodium phosphate    Subjective:   David [name] was seen and examined today.  No new complaints.   Objective:   Vitals:   10/20/22 0331 10/20/22 0851 10/20/22 0906 10/20/22 1312  BP: (!) 140/91  120/78 132/86  Pulse: 67 66 60 71  Resp: 16  17 18   Temp: 98.6 F (37 C)  98.2 F (36.8 C) (!) 97.5 F (36.4 C)  TempSrc: Oral  Oral Oral  SpO2: 98%  98% 98%  Weight:      Height:        Intake/Output Summary (Last 24 hours) at 10/20/2022 1339 Last data filed at 10/20/2022 0021 Gross per 24 hour  Intake 240 ml  Output  1900 ml  Net -1660 ml   Filed Weights   10/13/22 0500 10/14/22 0500 10/15/22 0500  Weight: 74.8 kg 74.9 kg 69.7 kg     Exam General: Alert and comfortable.  Cardiovascular: S1 S2 auscultated, no murmurs, RRR Respiratory: Clear to auscultation bilaterally,  Gastrointestinal: Soft, nontender, nondistended, + bowel sounds Ext: no pedal edema bilaterally Neuro: alert  Skin: No rashes    Data Reviewed:  I have personally reviewed following labs and imaging studies   CBC Lab Results  Component Value Date   WBC 8.1 10/12/2022   RBC 3.97 (L) 10/12/2022   HGB 11.7 (L) 10/12/2022   HCT 36.7 (L) 10/12/2022   MCV 92.4 10/12/2022   MCH 29.5 10/12/2022   PLT 236 10/12/2022   MCHC 31.9 10/12/2022   RDW 17.6 (H) 10/12/2022   LYMPHSABS 1.1 10/03/2022   MONOABS 1.0 10/03/2022   EOSABS 0.4 10/03/2022   BASOSABS 0.1 10/03/2022     Last metabolic panel Lab Results  Component Value Date   NA 129 (L) 10/12/2022   K 3.9 10/12/2022   CL 100 10/12/2022   CO2 22 10/12/2022   BUN 22 10/12/2022   CREATININE 1.33 (H) 10/12/2022   GLUCOSE 169 (H) 10/12/2022   GFRNONAA >60 10/12/2022   GFRAA >60 08/30/2019   CALCIUM 9.3 10/12/2022   PHOS 4.0 08/31/2022   PROT 6.8 09/29/2022   ALBUMIN 2.8 (L) 09/29/2022   LABGLOB 2.4 01/08/2021   AGRATIO 1.7 01/08/2021   BILITOT 0.7 09/29/2022   ALKPHOS 96 09/29/2022   AST 16 09/29/2022   ALT 14 09/29/2022   ANIONGAP 7 10/12/2022    CBG (last 3)  Recent Labs    10/19/22 2139 10/20/22 0639 10/20/22  1310  GLUCAP 135* 165* 254*      Coagulation Profile: No results for input(s): "INR", "PROTIME" in the last 168 hours.   Radiology Studies: No results found.     Kathlen Mody M.D. [name] 10/20/2022, 1:39 PM  Available via Epic secure chat 7am-7pm After 7 pm, please refer to night coverage provider listed on amion.

## 2022-10-21 ENCOUNTER — Telehealth (HOSPITAL_COMMUNITY): Payer: Self-pay | Admitting: Pharmacy Technician

## 2022-10-21 ENCOUNTER — Other Ambulatory Visit (HOSPITAL_COMMUNITY): Payer: Self-pay

## 2022-10-21 DIAGNOSIS — I10 Essential (primary) hypertension: Secondary | ICD-10-CM | POA: Diagnosis not present

## 2022-10-21 DIAGNOSIS — M4644 Discitis, unspecified, thoracic region: Secondary | ICD-10-CM | POA: Diagnosis not present

## 2022-10-21 DIAGNOSIS — F419 Anxiety disorder, unspecified: Secondary | ICD-10-CM | POA: Diagnosis not present

## 2022-10-21 DIAGNOSIS — I739 Peripheral vascular disease, unspecified: Secondary | ICD-10-CM | POA: Diagnosis not present

## 2022-10-21 LAB — GLUCOSE, CAPILLARY
Glucose-Capillary: 138 mg/dL — ABNORMAL HIGH (ref 70–99)
Glucose-Capillary: 212 mg/dL — ABNORMAL HIGH (ref 70–99)
Glucose-Capillary: 161 mg/dL — ABNORMAL HIGH (ref 70–99)
Glucose-Capillary: 175 mg/dL — ABNORMAL HIGH (ref 70–99)

## 2022-10-21 MED ORDER — SENNOSIDES-DOCUSATE SODIUM 8.6-50 MG PO TABS
1.0000 | ORAL_TABLET | Freq: Two times a day (BID) | ORAL | 0 refills | Status: DC
  Filled 2022-10-21: qty 30, 15d supply, fill #0

## 2022-10-21 MED ORDER — PANTOPRAZOLE SODIUM 40 MG PO TBEC
40.0000 mg | DELAYED_RELEASE_TABLET | Freq: Every day | ORAL | 0 refills | Status: DC
  Filled 2022-10-21: qty 90, 90d supply, fill #0

## 2022-10-21 MED ORDER — PREGABALIN 75 MG PO CAPS
75.0000 mg | ORAL_CAPSULE | Freq: Every day | ORAL | 0 refills | Status: DC
  Filled 2022-10-21: qty 30, 30d supply, fill #0

## 2022-10-21 MED ORDER — FINASTERIDE 5 MG PO TABS
5.0000 mg | ORAL_TABLET | Freq: Every day | ORAL | 0 refills | Status: DC
  Filled 2022-10-21: qty 90, 90d supply, fill #0

## 2022-10-21 MED ORDER — ZINC OXIDE 40 % EX OINT
TOPICAL_OINTMENT | Freq: Two times a day (BID) | CUTANEOUS | 0 refills | Status: DC
  Filled 2022-10-21 – 2022-12-15 (×2): qty 56.7, fill #0

## 2022-10-21 MED ORDER — HYDROMORPHONE HCL 1 MG/ML IJ SOLN
0.5000 mg | Freq: Three times a day (TID) | INTRAMUSCULAR | Status: AC | PRN
  Administered 2022-10-21 – 2022-10-22 (×2): 0.5 mg via INTRAVENOUS
  Filled 2022-10-21: qty 0.5

## 2022-10-21 MED ORDER — APIXABAN 5 MG PO TABS
5.0000 mg | ORAL_TABLET | Freq: Two times a day (BID) | ORAL | 1 refills | Status: DC
Start: 2022-10-21 — End: 2022-10-28
  Filled 2022-10-21: qty 180, 90d supply, fill #0

## 2022-10-21 MED ORDER — ENSURE ENLIVE PO LIQD
237.0000 mL | Freq: Two times a day (BID) | ORAL | 0 refills | Status: AC
  Filled 2022-10-21 – 2022-12-15 (×2): qty 40000, 84d supply, fill #0

## 2022-10-21 MED ORDER — FAMOTIDINE 20 MG PO TABS
ORAL_TABLET | ORAL | 2 refills | Status: DC
  Filled 2022-10-21: qty 180, 90d supply, fill #0

## 2022-10-21 MED ORDER — NALOXONE HCL 4 MG/0.1ML NA LIQD
NASAL | 0 refills | Status: AC
  Filled 2022-10-21: qty 2, 30d supply, fill #0

## 2022-10-21 MED ORDER — FERROUS SULFATE 325 (65 FE) MG PO TABS
325.0000 mg | ORAL_TABLET | ORAL | 0 refills | Status: DC
  Filled 2022-10-21: qty 100, 200d supply, fill #0

## 2022-10-21 MED ORDER — ENTRESTO 49-51 MG PO TABS
1.0000 | ORAL_TABLET | Freq: Two times a day (BID) | ORAL | 0 refills | Status: DC
  Filled 2022-10-21: qty 180, 90d supply, fill #0

## 2022-10-21 MED ORDER — FUROSEMIDE 40 MG PO TABS
20.0000 mg | ORAL_TABLET | Freq: Every day | ORAL | 0 refills | Status: DC
Start: 2022-10-21 — End: 2022-10-28
  Filled 2022-10-21: qty 45, 90d supply, fill #0

## 2022-10-21 MED ORDER — BISACODYL 10 MG RE SUPP
10.0000 mg | Freq: Every day | RECTAL | 0 refills | Status: DC | PRN
  Filled 2022-10-21: qty 12, 12d supply, fill #0

## 2022-10-21 MED ORDER — HYDROCODONE-ACETAMINOPHEN 10-325 MG PO TABS
1.0000 | ORAL_TABLET | Freq: Four times a day (QID) | ORAL | 0 refills | Status: DC | PRN
  Filled 2022-10-21: qty 12, 3d supply, fill #0

## 2022-10-21 MED ORDER — CARVEDILOL 3.125 MG PO TABS
3.1250 mg | ORAL_TABLET | Freq: Two times a day (BID) | ORAL | 0 refills | Status: DC
  Filled 2022-10-21: qty 180, 90d supply, fill #0

## 2022-10-21 MED ORDER — POLYETHYLENE GLYCOL 3350 17 GM/SCOOP PO POWD
17.0000 g | Freq: Two times a day (BID) | ORAL | 0 refills | Status: DC
  Filled 2022-10-21: qty 238, 7d supply, fill #0

## 2022-10-21 MED ORDER — LIDOCAINE 4 % EX PTCH
1.0000 | MEDICATED_PATCH | CUTANEOUS | 0 refills | Status: DC
  Filled 2022-10-21: qty 30, 30d supply, fill #0

## 2022-10-21 MED ORDER — DULOXETINE HCL 20 MG PO CPEP
20.0000 mg | ORAL_CAPSULE | Freq: Every day | ORAL | 0 refills | Status: DC
  Filled 2022-10-21: qty 30, 30d supply, fill #0

## 2022-10-21 MED ORDER — HYDROMORPHONE HCL 1 MG/ML IJ SOLN: 0.5000 mg | Freq: Once | INTRAMUSCULAR | Status: DC

## 2022-10-21 MED ORDER — ATORVASTATIN CALCIUM 80 MG PO TABS
80.0000 mg | ORAL_TABLET | Freq: Every day | ORAL | 1 refills | Status: DC
Start: 2022-10-21 — End: 2022-10-28
  Filled 2022-10-21: qty 90, 90d supply, fill #0

## 2022-10-21 MED ORDER — BUSPIRONE HCL 15 MG PO TABS
15.0000 mg | ORAL_TABLET | Freq: Two times a day (BID) | ORAL | 0 refills | Status: DC
  Filled 2022-10-21: qty 60, 30d supply, fill #0

## 2022-10-21 MED ORDER — FLUTICASONE PROPIONATE 50 MCG/ACT NA SUSP
2.0000 | Freq: Every day | NASAL | 2 refills | Status: DC
  Filled 2022-10-21: qty 16, 30d supply, fill #0
  Filled 2022-12-15 – 2023-03-03 (×2): qty 16, 30d supply, fill #1

## 2022-10-21 MED ORDER — TAMSULOSIN HCL 0.4 MG PO CAPS
0.4000 mg | ORAL_CAPSULE | Freq: Every day | ORAL | 0 refills | Status: DC
  Filled 2022-10-21: qty 90, 90d supply, fill #0

## 2022-10-21 MED ORDER — LANTUS SOLOSTAR 100 UNIT/ML ~~LOC~~ SOPN
20.0000 [IU] | PEN_INJECTOR | Freq: Every day | SUBCUTANEOUS | 11 refills | Status: DC
  Filled 2022-10-21: qty 15, 75d supply, fill #0

## 2022-10-21 MED ORDER — DIGOXIN 125 MCG PO TABS
125.0000 ug | ORAL_TABLET | Freq: Every day | ORAL | 0 refills | Status: DC
  Filled 2022-10-21: qty 90, 90d supply, fill #0

## 2022-10-21 MED ORDER — DOXYCYCLINE HYCLATE 100 MG PO TABS
100.0000 mg | ORAL_TABLET | Freq: Two times a day (BID) | ORAL | 3 refills | Status: DC
  Filled 2022-10-21: qty 60, 30d supply, fill #0

## 2022-10-21 NOTE — Progress Notes (Signed)
Physical Therapy Treatment Patient Details Name: David [name] MRN: [id number] DOB: [birth year] Today's Date: 10/21/2022   History of Present Illness Pt is a 62 y.o. male who presented 08/20/22  with hypoglycemia. 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 5/28 surgical repair: T5-11 posterior/posterolateral arthrodesis  & Transpedicular decompression with partial corpectomy, left T8 & T8-9 laminectomy, bilateral facetectomy. 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 Pt able to propel 1000+ feet to be able to go outside. While outside worked on strengthening exercises. Pt with much better awareness of form and able to self regulate rest breaks when form breaks. Plan for pt to discharge to new home with his wife on Sunday.     Assistance Recommended at Discharge Frequent or constant Supervision/Assistance (for ambulation)  If plan is discharge home, recommend the following:  Can travel by private vehicle    A little help with walking and/or transfers;A little help with bathing/dressing/bathroom;Assistance with cooking/housework;Direct supervision/assist for medications management;Assist for transportation;Help with stairs or ramp for entrance   Yes  Equipment Recommendations  Wheelchair (measurements PT);Wheelchair cushion (measurements PT)       Precautions / Restrictions Precautions Precautions: Fall;Back Precaution Booklet Issued: No Required Braces or Orthoses: Spinal Brace Spinal Brace: Thoracolumbosacral orthotic;Applied in sitting  position Other Brace: for use when ambulating Restrictions Weight Bearing Restrictions: No RLE Weight Bearing: Weight bearing as tolerated     Mobility  Bed Mobility Overal bed mobility: Modified Independent             General bed mobility comments: seated on EOB upon entry    Transfers Overall transfer level: Needs assistance Equipment used: Rolling walker (2 wheels) Transfers: Sit to/from Stand Sit to Stand: Supervision           General transfer comment: supervision to stand from wheelchair    Ambulation/Gait Ambulation/Gait assistance: Min guard, +2 safety/equipment   Assistive device: Rolling walker (2 wheels) Gait Pattern/deviations: Knee hyperextension - right, Knee hyperextension - left, Step-through pattern, Narrow base of support Gait velocity: decreased     General Gait Details: seated rest breaks to recover when muscles fatigue and pt requiring knee hyperextension for stability, when asked pt reports knowing that he is hyperextending for support, discussed that increased use of UE support and triceps activation to decrease weightbearing and knee buckling, pt verbalizes understanding but has decrease carryover to Fish farm manager Wheelchair mobility: Yes Wheelchair propulsion: Both upper extremities, Both lower extermities Wheelchair parts: Supervision/cueing Distance: 1000+ Wheelchair Assistance Details (indicate cue type and reason): pt able to manage leg rests with min A and propel long distance with supervision         Balance Overall balance assessment: Needs assistance Sitting-balance support: Feet supported, No upper extremity supported Sitting balance-Leahy Scale: Good     Standing balance support: Single extremity supported, Bilateral upper extremity supported, During functional activity Standing balance-Leahy Scale: Fair Standing balance comment: patient relies on external support for  balance  Cognition Arousal/Alertness: Awake/alert Behavior During Therapy: WFL for tasks assessed/performed Overall Cognitive Status: Within Functional Limits for tasks assessed Area of Impairment: Problem solving, Safety/judgement, Following commands                         Safety/Judgement: Decreased awareness of safety, Decreased awareness of deficits Awareness: Emergent   General Comments: excited about upcoming discharge        Exercises Other Exercises Other Exercises: squats performed from hall rail Other Exercises: side stepping at hall rail Other Exercises: calf raises    General Comments General comments (skin integrity, edema, etc.): pt with much better self regulation of exercise and walking, ablt to notice when he is hyperextending      Pertinent Vitals/Pain Pain Assessment Pain Assessment: Faces Faces Pain Scale: Hurts little more Pain Location: back and BLEs after standing exercises Pain Descriptors / Indicators: Discomfort, Guarding Pain Intervention(s): Limited activity within patient's tolerance, Monitored during session, Repositioned     PT Goals (current goals can now be found in the care plan section) Acute Rehab PT Goals PT Goal Formulation: With patient Time For Goal Achievement: 10/27/22 Potential to Achieve Goals: Good Progress towards PT goals: Progressing toward goals    Frequency    Min 5X/week      PT Plan Discharge plan needs to be updated    Co-evaluation PT/OT/SLP Co-Evaluation/Treatment: Yes Reason for Co-Treatment: For patient/therapist safety;To address functional/ADL transfers PT goals addressed during session: Mobility/safety with mobility;Balance;Proper use of DME OT goals addressed during session: ADL's and self-care      AM-PAC PT "6 Clicks" Mobility   Outcome Measure  Help needed turning from your back to your side while in a flat bed without using bedrails?: A  Little Help needed moving from lying on your back to sitting on the side of a flat bed without using bedrails?: A Little Help needed moving to and from a bed to a chair (including a wheelchair)?: A Little Help needed standing up from a chair using your arms (e.g., wheelchair or bedside chair)?: A Little Help needed to walk in hospital room?: A Little Help needed climbing 3-5 steps with a railing? : A Little 6 Click Score: 18    End of Session Equipment Utilized During Treatment: Back brace;Gait belt Activity Tolerance: Patient tolerated treatment well Patient left: in chair;with call bell/phone within reach Nurse Communication: Mobility status PT Visit Diagnosis: Difficulty in walking, not elsewhere classified (R26.2);Other abnormalities of gait and mobility (R26.89);Muscle weakness (generalized) (M62.81);Pain;Unsteadiness on feet (R26.81) Pain - Right/Left: Left Pain - part of body: Leg     Time: 4401-0272 PT Time Calculation (min) (ACUTE ONLY): 72 min  Charges:    $Gait Training: 8-22 mins $Therapeutic Exercise: 8-22 mins PT General Charges $$ ACUTE PT VISIT: 1 Visit                     Marty Uy B. Beverely Risen PT, DPT Acute Rehabilitation Services Please use secure chat or  Call Office [phone]    [facility] 10/21/2022, 6:02 PM

## 2022-10-21 NOTE — Consult Note (Addendum)
WOC Nurse Consult Note: Reason for Consult: Consult requested for "red buttocks." Pt declines assessment, stating that "he has Desitin ordered now and it is helped his red raw areas feel better and he does not need anything further." Description is consistent with moisture associated skin damage to bilat buttocks/sacrum.  Dressing procedure/placement/frequency: Topical treatment orders have been provided for bedside nurses as follows: Apply Desitin to bilat buttocks/sacrum BID and PRN when turning or cleaning Please re-consult if further assistance is needed.  Thank-you,  Cammie Mcgee MSN, RN, CWOCN, [place], CNS [phone]

## 2022-10-21 NOTE — Progress Notes (Signed)
Occupational Therapy Treatment Patient Details Name: David [name] MRN: [id number] DOB: May 28, 1960 Today's Date: 10/21/2022   History of present illness Pt is a 62 y.o. male who presented 08/20/22  with hypoglycemia. 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 5/28 surgical repair: T5-11 posterior/posterolateral arthrodesis  & Transpedicular decompression with partial corpectomy, left T8 & T8-9 laminectomy, bilateral facetectomy. 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 Patient educated on putting on and taking off leg rest, how to fold wheelchair, and brakes. Patient able to perform wheelchair mobility with occasional cues. Patient is expected to return home with wife this weekend.    Recommendations for follow up therapy are one component of a multi-disciplinary discharge planning process, led by the attending physician.  Recommendations may be updated based on patient status, additional functional criteria and insurance authorization.    Assistance Recommended at Discharge Intermittent Supervision/Assistance  Patient can return home with the following  A little help with walking and/or transfers;A little help with bathing/dressing/bathroom;Assistance with cooking/housework;Assist for transportation;Help with stairs or ramp for entrance   Equipment Recommendations  BSC/3in1;Wheelchair (measurements OT);Wheelchair cushion (measurements OT);Other (comment)    Recommendations for Other Services      Precautions / Restrictions  Precautions Precautions: Fall;Back Precaution Booklet Issued: No Required Braces or Orthoses: Spinal Brace Spinal Brace: Thoracolumbosacral orthotic;Applied in sitting position Other Brace: for use when ambulating Restrictions Weight Bearing Restrictions: No RLE Weight Bearing: Weight bearing as tolerated       Mobility Bed Mobility Overal bed mobility: Modified Independent             General bed mobility comments: seated on EOB upon entry    Transfers Overall transfer level: Needs assistance Equipment used: Rolling walker (2 wheels) Transfers: Sit to/from Stand Sit to Stand: Supervision           General transfer comment: supervision to stand from wheelchair     Balance Overall balance assessment: Needs assistance Sitting-balance support: Feet supported, No upper extremity supported Sitting balance-Leahy Scale: Good     Standing balance support: Single extremity supported, Bilateral upper extremity supported, During functional activity Standing balance-Leahy Scale: Fair Standing balance comment: patient relies on external support for balance                           ADL either performed or assessed with clinical judgement   ADL Overall ADL's : Needs assistance/impaired     Grooming: Min guard;Standing;Wash/dry hands;Wash/dry face;Oral care Grooming Details (indicate cue type and reason): stood at sink with support of sink for balance                               General ADL Comments: patient mod I with donning brace    Extremity/Trunk Assessment              Vision       Perception     Praxis      Cognition Arousal/Alertness: Awake/alert Behavior During Therapy: [facility]  for tasks assessed/performed Overall Cognitive Status: Within Functional Limits for tasks assessed Area of Impairment: Problem solving, Safety/judgement, Following commands                         Safety/Judgement: Decreased awareness of  safety, Decreased awareness of deficits Awareness: Emergent   General Comments: excited about upcoming discharge        Exercises Exercises: Other exercises Other Exercises Other Exercises: squats performed from hall rail Other Exercises: side stepping at hall rail Other Exercises: calf raises    Shoulder Instructions       General Comments supervision for wheelchair mobiltiy with occasional verbal cues    Pertinent Vitals/ Pain       Pain Assessment Pain Assessment: Faces Faces Pain Scale: Hurts little more Pain Location: back and BLEs after standing exercises Pain Descriptors / Indicators: Discomfort, Guarding Pain Intervention(s): Limited activity within patient's tolerance, Monitored during session, Repositioned  Home Living                                          Prior Functioning/Environment              Frequency  Min 5X/week        Progress Toward Goals  OT Goals(current goals can now be found in the care plan section)  Progress towards OT goals: Progressing toward goals  Acute Rehab OT Goals Patient Stated Goal: go home OT Goal Formulation: With patient Time For Goal Achievement: 10/24/22 Potential to Achieve Goals: Good ADL Goals Pt Will Perform Grooming: Independently;standing Pt Will Perform Lower Body Bathing: with modified independence;sit to/from stand;with adaptive equipment Pt Will Perform Lower Body Dressing: with modified independence;sit to/from stand;with adaptive equipment Pt Will Transfer to Toilet: with modified independence;ambulating Pt Will Perform Toileting - Clothing Manipulation and hygiene: with modified independence;sit to/from stand Additional ADL Goal #1: Pt to demo ability to gather ADL/IADL items MOD I with most appropriate DME  Plan Discharge plan remains appropriate;Frequency remains appropriate;Frequency needs to be updated    Co-evaluation    PT/OT/SLP Co-Evaluation/Treatment: Yes Reason for  Co-Treatment: For patient/therapist safety;To address functional/ADL transfers PT goals addressed during session: Mobility/safety with mobility;Balance;Proper use of DME OT goals addressed during session: ADL's and self-care      AM-PAC OT "6 Clicks" Daily Activity     Outcome Measure   Help from another person eating meals?: None Help from another person taking care of personal grooming?: A Little Help from another person toileting, which includes using toliet, bedpan, or urinal?: A Little Help from another person bathing (including washing, rinsing, drying)?: A Little Help from another person to put on and taking off regular upper body clothing?: A Little Help from another person to put on and taking off regular lower body clothing?: A Little 6 Click Score: 19    End of Session Equipment Utilized During Treatment: Gait belt;Rolling walker (2 wheels);Other (comment) (wheelchair)  OT Visit Diagnosis: Unsteadiness on feet (R26.81);Other abnormalities of gait and mobility (R26.89);Muscle weakness (generalized) (M62.81);Pain Pain - Right/Left: Left (and right) Pain - part of body: Leg   Activity Tolerance Patient tolerated treatment well   Patient Left in chair;with call bell/phone within reach   Nurse Communication Mobility status        Time: 9528-4132 OT Time Calculation (min): 72 min  Charges: OT General Charges $OT Visit: 1 Visit OT  Treatments $Self Care/Home Management : 8-22 mins $Therapeutic Activity: 23-37 mins  [name], OTA Acute Rehabilitation Services  Office [phone]   Dewain Penning 10/21/2022, 3:25 PM

## 2022-10-21 NOTE — Telephone Encounter (Signed)
Pharmacy Patient Advocate Encounter   Received notification from that prior authorization for Lidocaine 5% patchesis required/requested.   Insurance verification completed.   The patient is insured through [facility] MEDICAID .   Per test claim: PA submitted to [place] MEDICAID via CoverMyMeds Key/confirmation #/EOC [id number]  Status is pending

## 2022-10-21 NOTE — Progress Notes (Signed)
[name]                                                                               David [name], is a 62 y.o. male, DOB - [birth year], ZOX:[id number] Admit date - 08/20/2022    Outpatient Primary MD for the patient is [name], MD  LOS - 59  days    Brief summary   David [name] is a 62 y.o. 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    Essential hypertension Better controlled.  - Continue Coreg, furosemide, Entresto,.   Uncontrolled type 2 diabetes with hyperglycemia and hypoglycemia, insulin long term use CBG (last 3)  Recent Labs    10/20/22 1735 10/20/22 2121 10/21/22 0607  GLUCAP 161* 202* 138*   Resume Semglee and  SSI. Last A1c is 5.5 % on 10/08/2022.     Anxiety Continue with Buspar 15 mg BID.   Acute urinary retention - Continue Flomax and finasteride   Chronic systolic CHF (congestive heart failure) (HCC) EF 35-40% in 2020, now resolved to normal. Last echo on 07/2022 showed LVEF of 60 to 65% with no regional wall abnormalities.  Appears euvolemic here - Continue Entresto, digoxin, Coreg, Lasix  Pleural effusion Stable, asymptomatic.   Acute postoperative anemia due to expected blood loss  Last hemoglobin around 11.7. Checking cbc in am.   Acute toxic encephalopathy At baseline has no cognitive impairment.  Developed encephalopathy (poorly responsive, confused responses) here due to high doses of opiates.  Resolved now.  Hypokalemia Resolved  AKI (acute kidney injury), ruled out    PVD (peripheral vascular disease) (HCC) S/p angioplasty in April - Continue atorvastatin, apixaban  Hypomagnesemia Resolved  Hyponatremia Mild, asymptomatic, recheck labs in am.   Pure hypercholesterolemia - Continue Lipitor  Persistent atrial fibrillation (HCC) Rate controlled - Continue Eliquis - Continue Coreg, digoxin     Estimated body mass index is 21.23 kg/m as calculated from the following:   Height as of this encounter: 6' (1.829 m).   Weight as of this encounter: 71 kg.  Code Status: FULL CODE.  DVT Prophylaxis:  SCD's  Start: 08/30/22 1939 apixaban (ELIQUIS) tablet 5 mg   Level of Care: Level of care: Med-Surg Family Communication: none at bedside.   Disposition Plan:     Remains inpatient appropriate:  possible discharge this weekend when all the equipment Is set up.    Consultants:   Palliative care Psychiatry.  ID.  Neurosurgery.   Antimicrobials:   Anti-infectives (From admission, onward)    Start     Dose/Rate Route Frequency Ordered Stop   10/08/22 1400  Urelle (URELLE/URISED) 81 MG tablet 81 mg        1 tablet Oral 4 times daily 10/08/22 1140 10/11/22  1359   09/28/22 1000  doxycycline (VIBRA-TABS) tablet 100 mg        100 mg Oral Every 12 hours 09/01/22 1113     08/31/22 2000  DAPTOmycin (CUBICIN) 650 mg in sodium chloride 0.9 % IVPB        8 mg/kg  83.3 kg 126 mL/hr over 30 Minutes Intravenous Daily 08/31/22 1053 09/27/22 1432   08/30/22 2300  vancomycin (VANCOREADY) IVPB 750 mg/150 mL  Status:  Discontinued        750 mg 150 mL/hr over 60 Minutes Intravenous Every 12 hours 08/30/22 1150 08/31/22 1053   08/30/22 1712  vancomycin (VANCOCIN) powder  Status:  Discontinued          As needed 08/30/22 1714 08/30/22 1756   08/30/22 1142  ceFAZolin (ANCEF) 2-4 GM/100ML-% IVPB  Status:  Discontinued       Note to Pharmacy: [facility], [place]: cabinet override      08/30/22 1142 08/30/22 1300   08/30/22 0600  ceFAZolin (ANCEF) IVPB 2g/100 mL premix        2 g 200 mL/hr over 30 Minutes Intravenous On call to O.R. 08/29/22 1848 08/30/22 1401   08/27/22 1041  vancomycin (VANCOCIN) IVPB 1000 mg/200 mL premix  Status:  Discontinued        1,000 mg 200 mL/hr over 60 Minutes Intravenous Every 12 hours 08/27/22 0802 08/30/22 1150   08/26/22 2300  vancomycin (VANCOREADY) IVPB 1250 mg/250 mL  Status:  Discontinued        1,250 mg 166.7 mL/hr over 90 Minutes Intravenous Every 24 hours 08/26/22 1014 08/27/22 0802   08/23/22 2200  vancomycin (VANCOREADY) IVPB 750 mg/150 mL  Status:  Discontinued        750 mg 150 mL/hr over 60 Minutes Intravenous Every 12 hours 08/23/22 0938 08/26/22 1014   08/23/22 1030  vancomycin (VANCOREADY) IVPB 1500 mg/300 mL        1,500 mg 150 mL/hr over 120 Minutes Intravenous  Once 08/23/22 0938 08/23/22 1312   08/20/22 2200  doxycycline (VIBRA-TABS) tablet 100 mg  Status:  Discontinued        100 mg Oral 2 times daily 08/20/22 1737 08/23/22 0927        Medications  Scheduled Meds:  acetaminophen  1,000 mg Oral TID   apixaban  5 mg Oral BID   atorvastatin  80 mg Oral Daily   busPIRone  15 mg Oral BID   carvedilol   3.125 mg Oral BID   celecoxib  100 mg Oral BID   digoxin  125 mcg Oral Daily   doxycycline  100 mg Oral Q12H   DULoxetine  20 mg Oral Daily   famotidine  20 mg Oral BID   feeding supplement  237 mL Oral BID BM   ferrous sulfate  325 mg Oral QODAY   finasteride  5 mg  Oral Daily   fluticasone  1 spray Each Nare Daily   furosemide  20 mg Oral Daily   insulin aspart  0-5 Units Subcutaneous QHS   insulin aspart  0-9 Units Subcutaneous TID WC   insulin glargine-yfgn  20 Units Subcutaneous Daily   lidocaine  1 patch Transdermal Q24H   liver oil-zinc oxide   Topical BID   magnesium oxide  400 mg Oral BID   pantoprazole  40 mg Oral Daily   polyethylene glycol  17 g Oral BID   pregabalin  75 mg Oral QHS   sacubitril-valsartan  1 tablet Oral BID   senna-docusate  1 tablet Oral BID   sodium chloride flush  3 mL Intravenous Q12H   tamsulosin  0.4 mg Oral QPC supper   Continuous Infusions:  sodium chloride 250 mL (09/25/22 1625)   sodium chloride Stopped (09/15/22 1353)   PRN Meds:.sodium chloride, bisacodyl, cloNIDine, HYDROcodone-acetaminophen, HYDROmorphone (DILAUDID) injection, methocarbamol, ondansetron **OR** ondansetron (ZOFRAN) IV, sodium chloride, sodium chloride flush, sodium phosphate    Subjective:   David [name] was seen and examined today.  No new complaints today.   Objective:   Vitals:   10/21/22 0006 10/21/22 0320 10/21/22 0356 10/21/22 0854  BP: (!) 122/99  (!) 152/96 (!) 140/94  Pulse: 74  75 83  Resp: 16  18 18   Temp: 98.1 F (36.7 C)  98.1 F (36.7 C) 98.4 F (36.9 C)  TempSrc: Oral  Oral Oral  SpO2: 100%  99% 98%  Weight:  71 kg    Height:        Intake/Output Summary (Last 24 hours) at 10/21/2022 1105 Last data filed at 10/21/2022 0300 Gross per 24 hour  Intake --  Output 350 ml  Net -350 ml   Filed Weights   10/14/22 0500 10/15/22 0500 10/21/22 0320  Weight: 74.9 kg 69.7 kg 71 kg     Exam General exam: Appears calm and comfortable   Respiratory system: Clear to auscultation. Respiratory effort normal. Cardiovascular system: S1 & S2 heard, RRR. No JVD,  Gastrointestinal system: Abdomen is nondistended, soft and nontender.  Central nervous system: Alert and oriented. No focal neurological deficits. Extremities: Symmetric 5 x 5 power. Skin: No rashes,  Psychiatry:  Mood & affect appropriate.     Data Reviewed:  I have personally reviewed following labs and imaging studies   CBC Lab Results  Component Value Date   WBC 8.1 10/12/2022   RBC 3.97 (L) 10/12/2022   HGB 11.7 (L) 10/12/2022   HCT 36.7 (L) 10/12/2022   MCV 92.4 10/12/2022   MCH 29.5 10/12/2022   PLT 236 10/12/2022   MCHC 31.9 10/12/2022   RDW 17.6 (H) 10/12/2022   LYMPHSABS 1.1 10/03/2022   MONOABS 1.0 10/03/2022   EOSABS 0.4 10/03/2022   BASOSABS 0.1 10/03/2022     Last metabolic panel Lab Results  Component Value Date   NA 129 (L) 10/12/2022   K 3.9 10/12/2022   CL 100 10/12/2022   CO2 22 10/12/2022   BUN 22 10/12/2022   CREATININE 1.33 (H) 10/12/2022   GLUCOSE 169 (H) 10/12/2022   GFRNONAA >60 10/12/2022   GFRAA >60 08/30/2019   CALCIUM 9.3 10/12/2022   PHOS 4.0 08/31/2022   PROT 6.8 09/29/2022   ALBUMIN 2.8 (L) 09/29/2022   LABGLOB 2.4 01/08/2021   AGRATIO 1.7 01/08/2021   BILITOT 0.7 09/29/2022   ALKPHOS 96 09/29/2022   AST 16 09/29/2022   ALT 14 09/29/2022   ANIONGAP 7 10/12/2022  CBG (last 3)  Recent Labs    10/20/22 1735 10/20/22 2121 10/21/22 0607  GLUCAP 161* 202* 138*      Coagulation Profile: No results for input(s): "INR", "PROTIME" in the last 168 hours.   Radiology Studies: No results found.     Kathlen Mody M.D. [name] 10/21/2022, 11:05 AM  Available via Epic secure chat 7am-7pm After 7 pm, please refer to night coverage provider listed on amion.

## 2022-10-21 NOTE — TOC Progression Note (Signed)
Transition of Care [facility]) - Progression Note    Patient Details  Name: David [name] MRN: [id number] Date of Birth: 09/28/1960  [facility] CM/SW Contact  Baldemar Lenis, [place] Phone Number: 10/21/2022, 1:32 PM  Clinical Narrative:   CSW discussed plan of care with PT and with patient. Patient will be able to discharge home on Sunday. CSW ordered wheelchair through [employer]. CSW needs the patient's address to arrange outpatient therapy. Patient asking for assistance with transportation, CSW to confirm that patient has access to transportation with Medicaid. CSW to follow.    Expected Discharge Plan: Home/Self Care Barriers to Discharge: Inadequate or no insurance, SNF Pending bed offer, SNF Pending payor source - LOG  Expected Discharge Plan and Services   Discharge Planning Services: CM Consult Post Acute Care Choice: Home Health Living arrangements for the past 2 months: Hotel/Motel                                       Social Determinants of Health (SDOH) Interventions SDOH Screenings   Food Insecurity: Patient Unable To Answer (08/23/2022)  Housing: Patient Unable To Answer (08/23/2022)  Transportation Needs: Patient Unable To Answer (08/23/2022)  Utilities: Patient Unable To Answer (08/23/2022)  Depression (PHQ2-9): Low Risk  (11/12/2021)  Stress: Stress Concern Present (09/26/2017)  Tobacco Use: Medium Risk (09/15/2022)    Readmission Risk Interventions    09/06/2021   12:07 PM  Readmission Risk Prevention Plan  Post Dischage Appt Complete  Medication Screening Complete  Transportation Screening Complete

## 2022-10-21 NOTE — TOC Progression Note (Signed)
Transition of Care [facility]) - Progression Note    Patient Details  Name: David [name] MRN: [id number] Date of Birth: May 16, 1960  Transition [facility] Care [facility]) CM/SW Contact  Baldemar Lenis, [place] Phone Number: 10/21/2022, 1:33 PM  Clinical Narrative:   CSW obtained information on patient setting up transportation through his Medicaid, contact information placed on the patient's AVS for discharge. CSW updated MD to send patient's medications to [facility] for patient to take home on Sunday. CSW attempted to speak with patient about the address he is going to in order to arrange outpatient, but patient unavailable. CSW to follow.    Expected Discharge Plan: Home/Self Care Barriers to Discharge: Inadequate or no insurance, SNF Pending bed offer, SNF Pending payor source - LOG  Expected Discharge Plan and Services   Discharge Planning Services: CM Consult Post Acute Care Choice: Home Health Living arrangements for the past 2 months: Hotel/Motel                                       Social Determinants of Health (SDOH) Interventions SDOH Screenings   Food Insecurity: Patient Unable To Answer (08/23/2022)  Housing: Patient Unable To Answer (08/23/2022)  Transportation Needs: Patient Unable To Answer (08/23/2022)  Utilities: Patient Unable To Answer (08/23/2022)  Depression (PHQ2-9): Low Risk  (11/12/2021)  Stress: Stress Concern Present (09/26/2017)  Tobacco Use: Medium Risk (09/15/2022)    Readmission Risk Interventions    09/06/2021   12:07 PM  Readmission Risk Prevention Plan  Post Dischage Appt Complete  Medication Screening Complete  Transportation Screening Complete

## 2022-10-22 DIAGNOSIS — M4644 Discitis, unspecified, thoracic region: Secondary | ICD-10-CM | POA: Diagnosis not present

## 2022-10-22 LAB — CBC WITH DIFFERENTIAL/PLATELET
Abs Immature Granulocytes: 0.03 10*3/uL (ref 0.00–0.07)
Basophils Absolute: 0.1 10*3/uL (ref 0.0–0.1)
Basophils Relative: 2 %
Eosinophils Absolute: 0.5 10*3/uL (ref 0.0–0.5)
Eosinophils Relative: 6 %
HCT: 41.9 % (ref 39.0–52.0)
Hemoglobin: 14 g/dL (ref 13.0–17.0)
Immature Granulocytes: 0 %
Lymphocytes Relative: 17 %
Lymphs Abs: 1.4 10*3/uL (ref 0.7–4.0)
MCH: 30.3 pg (ref 26.0–34.0)
MCHC: 33.4 g/dL (ref 30.0–36.0)
MCV: 90.7 fL (ref 80.0–100.0)
Monocytes Absolute: 0.9 10*3/uL (ref 0.1–1.0)
Monocytes Relative: 11 %
Neutro Abs: 5.3 10*3/uL (ref 1.7–7.7)
Neutrophils Relative %: 64 %
Platelets: 214 10*3/uL (ref 150–400)
RBC: 4.62 MIL/uL (ref 4.22–5.81)
RDW: 16.5 % — ABNORMAL HIGH (ref 11.5–15.5)
WBC: 8.2 10*3/uL (ref 4.0–10.5)
nRBC: 0 % (ref 0.0–0.2)

## 2022-10-22 LAB — BASIC METABOLIC PANEL
Anion gap: 8 (ref 5–15)
BUN: 18 mg/dL (ref 8–23)
CO2: 24 mmol/L (ref 22–32)
Calcium: 9.9 mg/dL (ref 8.9–10.3)
Chloride: 101 mmol/L (ref 98–111)
Creatinine, Ser: 1.17 mg/dL (ref 0.61–1.24)
GFR, Estimated: >60 mL/min (ref >60)
Glucose, Bld: 181 mg/dL — ABNORMAL HIGH (ref 70–99)
Potassium: 4.4 mmol/L (ref 3.5–5.1)
Sodium: 133 mmol/L — ABNORMAL LOW (ref 135–145)

## 2022-10-22 LAB — GLUCOSE, CAPILLARY
Glucose-Capillary: 179 mg/dL — ABNORMAL HIGH (ref 70–99)
Glucose-Capillary: 165 mg/dL — ABNORMAL HIGH (ref 70–99)
Glucose-Capillary: 179 mg/dL — ABNORMAL HIGH (ref 70–99)
Glucose-Capillary: 249 mg/dL — ABNORMAL HIGH (ref 70–99)

## 2022-10-22 MED ORDER — HYDROMORPHONE HCL 1 MG/ML IJ SOLN
1.0000 mg | Freq: Once | INTRAMUSCULAR | Status: AC
  Administered 2022-10-22: 1 mg via INTRAVENOUS
  Filled 2022-10-22: qty 1

## 2022-10-22 NOTE — Discharge Instructions (Signed)
**  IMPORTANT DISCHARGE INSTRUCTIONS**   From Dr. Maryfrances Bunnell:  Your medications at discharge can be broken down in the following way:   HEART MEDICINES The following are all "heart protective" medicines: Entresto (sacubitril-valsartan) Coreg (carvedilol) Digoxin Lasix (furosemide)    ATRIAL FIBRILLATION MEDICINES Eliquis (apixaban)  Coreg Digoxin Digoxin and Coreg also slow the heart to control A Fib, and eliquis is the blood thinner that prevents strokes    PROSTATE MEDICINES These prevent enlargement of the prostate to make sure you can pee Eventually, you should be able to stop these, but I would see a Urologist first Avodart (finasteride) Flomax (tamsulosin)     ANTIBIOTICS To treat infection, I believe you are going to take this for an extended period of time.  I would recommend you take it until you see an Infectious Disease specialist and they tell you to stop: Doxycycline      PAIN MEDICINES: The following are pain medicines, eventually you should be able to stop them: Cymbalta (duloxetine) Lyrica (pregabalin) Norco (hydrocodone-acetaminophen) Lidocaine topical patches      ANXIETY MEDICINES: BuSpar (buspirone)      CHOLESTEROL MEDICINE Lipitor (atorvastatin)

## 2022-10-22 NOTE — Plan of Care (Signed)
  Problem: Activity: Goal: Ability to return to baseline activity level will improve Outcome: Progressing   Problem: Nutrition: Goal: Adequate nutrition will be maintained Outcome: Progressing   Problem: Pain Managment: Goal: General experience of comfort will improve Outcome: Progressing   Problem: Safety: Goal: Ability to remain free from injury will improve Outcome: Progressing   Problem: Activity: Goal: Ability to avoid complications of mobility impairment will improve Outcome: Progressing Goal: Ability to tolerate increased activity will improve Outcome: Progressing Goal: Will remain free from falls Outcome: Progressing

## 2022-10-22 NOTE — Plan of Care (Signed)
  Problem: Education: Goal: Understanding of CV disease, CV risk reduction, and recovery process will improve Outcome: Progressing   Problem: Activity: Goal: Ability to return to baseline activity level will improve Outcome: Progressing   Problem: Education: Goal: Knowledge of General Education information will improve Description: Including pain rating scale, medication(s)/side effects and non-pharmacologic comfort measures Outcome: Progressing   Problem: Health Behavior/Discharge Planning: Goal: Ability to manage health-related needs will improve Outcome: Progressing   Problem: Nutrition: Goal: Adequate nutrition will be maintained Outcome: Progressing   Problem: Skin Integrity: Goal: Will show signs of wound healing Outcome: Progressing   Problem: Coping: Goal: Level of anxiety will decrease Outcome: Not Progressing   Problem: Metabolic: Goal: Ability to maintain appropriate glucose levels will improve Outcome: Not Progressing

## 2022-10-22 NOTE — Progress Notes (Signed)
Progress Note   Patient: David [name] [id number]:[id number] DOB: 03-08-1961 DOA: 08/20/2022     60 DOS: the patient was seen and examined on 10/22/2022 at 9:25AM      Brief hospital course: David [name] is a 62 y.o. 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 and septic embolism to right popliteal leg; finally underwent right popliteal/TP/peroneal angioplasty then partial amputation R foot --> discharged on oral antibiotics  5/18 Unfortunately readmitted the next day for profound hypoglycemia  5/20 CT chest and MR spine imaging show progression of discitis/vertebral osteomyelitis; NSGY reconsulted, recommended decompression and stabilizing surgery this time 5/21 ID reconsulted 5/24 Palliative care consulted for pain management due to difficult to control pain 5/28  Taken to the OR for thoracic decompression T5-T11 posterior posterolateral arthrodesis etc by Dr. 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 - Continue doxycycline "indefinitely" - Outpatient follow-up with infectious disease  - Plan to discharge off opiates  - Continue new duloxetine and PTA Lyrica at discharge     Persistent atrial fibrillation (HCC) Rate controlled - Continue Eliquis - Continue Coreg, digoxin  Chronic systolic CHF (congestive heart failure) (HCC) EF 35-40% in 2020, now resolved to normal Appears  euvolemic here - Continue Entresto, digoxin, Coreg, Lasix  PVD (peripheral vascular disease) (HCC) S/p angioplasty in April - Continue atorvastatin, apixaban  Essential hypertension BP controlled - Continue Coreg, furosemide, Entresto  Uncontrolled type 2 diabetes with hyperglycemia and hypoglycemia, insulin long term use Glucoses controlled - Continue home glargine and aspart - Continue duloxetine, Lyrica  Anxiety - Continue Buspar     Acute urinary retention Resolved  Pleural effusion Resolved on repeat CXR  Acute postoperative anemia due to expected blood loss Hgb trending up to 10s now.  Likely some iron deficiency contributing. - Oral iron  Acute toxic encephalopathy At baseline has no cognitive impairment.  Developed encephalopathy (poorly responsive, confused responses) here due to high doses of opiates.  Resolved now.  Hypokalemia Resolved  AKI (acute kidney injury), ruled out   Hypomagnesemia Resolved  Hyponatremia Mild, asymptomatic  Pure hypercholesterolemia - Continue Lipitor          Subjective: No new complaints, ready to go home tomrorow     Physical Exam: BP (!) 125/90 (BP Location: Right Arm)   Pulse 74   Temp 98.3 F (36.8 C) (Oral)   Resp 18   Ht 6' (1.829 m)   Wt 71.1 kg   SpO2 100%   BMI 21.26 kg/m   Adult male, lying in bed, interactive and appropriate RRR, no murmurs, no peripheral edema Respiratory normal, lungs clear without rales or wheezes  Data Reviewed: BMP and CBC with mild anemia only  Family Communication: None present    Disposition: Status is: Inpatient The patient is medically stable, does not yet have a safe disposition, [facility] program is working towards strengthening him so he can go home on Sunday        Author:  Alberteen Sam, MD 10/22/2022 3:50 PM  For on call review [email or web address].

## 2022-10-23 DIAGNOSIS — M4640 Discitis, unspecified, site unspecified: Secondary | ICD-10-CM | POA: Diagnosis not present

## 2022-10-23 LAB — GLUCOSE, CAPILLARY
Glucose-Capillary: 159 mg/dL — ABNORMAL HIGH (ref 70–99)
Glucose-Capillary: 213 mg/dL — ABNORMAL HIGH (ref 70–99)
Glucose-Capillary: 226 mg/dL — ABNORMAL HIGH (ref 70–99)
Glucose-Capillary: 191 mg/dL — ABNORMAL HIGH (ref 70–99)
Glucose-Capillary: 219 mg/dL — ABNORMAL HIGH (ref 70–99)

## 2022-10-23 NOTE — Progress Notes (Signed)
Progress Note   Patient: David [name] ZOX:[id number] DOB: 23-Dec-1960 DOA: 08/20/2022     62 DOS: the patient was seen and examined on 10/23/2022 at 9:25AM      Brief hospital course: Mr. Hynek is a 62 y.o. 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 and septic embolism to right popliteal leg; finally underwent right popliteal/TP/peroneal angioplasty then partial amputation R foot --> discharged on oral antibiotics  5/18 Unfortunately readmitted the next day for profound hypoglycemia  5/20 CT chest and MR spine imaging show progression of discitis/vertebral osteomyelitis; NSGY reconsulted, recommended decompression and stabilizing surgery this time 5/21 ID reconsulted 5/24 Palliative care consulted for pain management due to difficult to control pain 5/28  Taken to the OR for thoracic decompression T5-T11 posterior posterolateral arthrodesis etc by Dr. Maisie Fus, intraoperative cultures grew MRSA  6/25 Finished IV antibiotics, transitioned to doxycycline  10/23/2022: Patient seen alongside patient's wife and nurse.  Patient reports that they have lost their current recommendation.  Will consult TOC to assist with safe discharge.   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 - Continue doxycycline "indefinitely" - Outpatient follow-up with infectious disease  - Continue new duloxetine and PTA Lyrica at discharge  10/23/2022: Wean patient off opiates.  Persistent atrial fibrillation  (HCC) Rate controlled - Continue Eliquis - Continue Coreg, digoxin 10/23/2022: Pursue disposition.  Follow-up with PCP and cardiology on discharge.  Chronic systolic CHF (congestive heart failure) (HCC) EF 35-40% in 2020, now resolved to normal Appears euvolemic here - Continue Entresto, digoxin, Coreg, Lasix  PVD (peripheral vascular disease) (HCC) S/p angioplasty in April - Continue atorvastatin, apixaban  Essential hypertension BP controlled - Continue Coreg, furosemide, Entresto  Uncontrolled type 2 diabetes with hyperglycemia and hypoglycemia, insulin long term use Glucoses controlled - Continue home glargine and aspart - Continue duloxetine, Lyrica  Anxiety - Continue Buspar  Acute urinary retention Resolved  Pleural effusion Resolved on repeat CXR  Acute postoperative anemia due to expected blood loss Hgb trending up to 10s now.  Likely some iron deficiency contributing. - Oral iron  Acute toxic encephalopathy At baseline has no cognitive impairment.  Developed encephalopathy (poorly responsive, confused responses) here due to high doses of opiates.  Resolved now.  Hypokalemia Resolved  AKI (acute kidney injury), ruled out   Hypomagnesemia Resolved  Hyponatremia Mild, asymptomatic  Pure hypercholesterolemia - Continue Lipitor   Subjective:  -Patient seen alongside patient's wife and nurse. -No new complaints. -Reports social situation at home.  Tells me they have lost their current accommodation.  Physical Exam: BP (!) 136/91 (BP Location: Left Arm)   Pulse 79   Temp 98.2 F (36.8 C) (Oral)   Resp 18   Ht 6' (1.829 m)   Wt 73.1 kg   SpO2 100%   BMI 21.86 kg/m   General condition: Patient is not in any distress.  Patient is awake and alert.  Patient looks chronically ill. HEENT: Patient is pale.  No jaundice. Neck: Supple. Lungs: Clear to auscultation. CVS: S1-S2. Abdomen: Soft and nontender. Neuro: Awake and  alert.  Moves all  extremities. Extremities: No leg edema.  Family Communication: Wife    Disposition: Status is: Inpatient The patient is medically stable, does not yet have a safe disposition, [facility] program is working towards strengthening him so he can go home on Sunday        Author: Barnetta Chapel, MD 10/23/2022 12:07 PM  For on call review [email or web address].

## 2022-10-23 NOTE — Plan of Care (Signed)
  Problem: Education: Goal: Knowledge of General Education information will improve Description Including pain rating scale, medication(s)/side effects and non-pharmacologic comfort measures Outcome: Progressing   

## 2022-10-24 ENCOUNTER — Other Ambulatory Visit (HOSPITAL_COMMUNITY): Payer: Self-pay

## 2022-10-24 DIAGNOSIS — M4640 Discitis, unspecified, site unspecified: Secondary | ICD-10-CM | POA: Diagnosis not present

## 2022-10-24 LAB — GLUCOSE, CAPILLARY
Glucose-Capillary: 189 mg/dL — ABNORMAL HIGH (ref 70–99)
Glucose-Capillary: 222 mg/dL — ABNORMAL HIGH (ref 70–99)
Glucose-Capillary: 157 mg/dL — ABNORMAL HIGH (ref 70–99)
Glucose-Capillary: 188 mg/dL — ABNORMAL HIGH (ref 70–99)

## 2022-10-24 NOTE — Progress Notes (Signed)
Physical Therapy Treatment Patient Details Name: David [name] MRN: [id number] DOB: [birth year] Today's Date: 10/24/2022   History of Present Illness Pt is a 62 y.o. male who presented 08/20/22  with hypoglycemia. 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 5/28 surgical repair: T5-11 posterior/posterolateral arthrodesis  & Transpedicular decompression with partial corpectomy, left T8 & T8-9 laminectomy, bilateral facetectomy. 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 Pt tolerated repeated bouts of short distance gait, stair navigation, multiple sit<>stands, and LE strengthening exercises. Pt tolerated well but continues to require cuing for form and rest breaks throughout. PT to continue to follow and progress mobility.      Assistance Recommended at Discharge Frequent or constant Supervision/Assistance  If plan is discharge home, recommend the following:  Can travel by private vehicle    A little help with walking and/or transfers;A little help with bathing/dressing/bathroom;Assistance with cooking/housework;Direct supervision/assist for medications management;Assist for transportation;Help with stairs or ramp for entrance      Equipment Recommendations  Wheelchair (measurements PT);Wheelchair cushion (measurements PT)    Recommendations for Other Services       Precautions / Restrictions Precautions Precautions: Fall Required Braces or Orthoses: Spinal Brace Spinal Brace: Thoracolumbosacral orthotic;Applied in sitting  position Other Brace: for use when ambulating Restrictions Weight Bearing Restrictions: No RLE Weight Bearing: Weight bearing as tolerated     Mobility  Bed Mobility Overal bed mobility: Needs Assistance             General bed mobility comments: EOB    Transfers Overall transfer level: Needs assistance Equipment used: Rolling walker (2 wheels) Transfers: Sit to/from Stand Sit to Stand: Supervision           General transfer comment: supervision to stand from wheelchair, multiple stands throughout session with pt using UEs for slow eccentric lower    Ambulation/Gait Ambulation/Gait assistance: Min guard, +2 safety/equipment Gait Distance (Feet): 50 Feet Assistive device: Rolling walker (2 wheels) Gait Pattern/deviations: Knee hyperextension - right, Knee hyperextension - left, Step-through pattern, Narrow base of support Gait velocity: decr     General Gait Details: 2x50 ft gait, close guard for safety. knee hyperextension noted with fatigue, pt good at directing own rest breaks   Stairs Stairs: Yes Stairs assistance: Min guard Stair Management: Step to pattern, Forwards, Two rails Number of Stairs: 3 General stair comments: x2 - cues for sequencing and slowed speed   Wheelchair Mobility     Tilt Bed    Modified Rankin (Stroke Patients Only)       Balance Overall balance assessment: Needs assistance Sitting-balance support: Feet supported, No upper extremity supported Sitting balance-Leahy Scale: Good     Standing balance support: Single extremity supported, Bilateral upper extremity supported, During functional activity Standing balance-Leahy Scale: Fair Standing balance comment: patient relies on external support for balance                            Cognition Arousal/Alertness: Awake/alert Behavior During Therapy: WFL for tasks assessed/performed Overall Cognitive Status: Within Functional  Limits for tasks assessed Area of  Impairment: Problem solving, Safety/judgement, Following commands                         Safety/Judgement: Decreased awareness of safety, Decreased awareness of deficits Awareness: Emergent Problem Solving: Requires verbal cues, Requires tactile cues General Comments: benefits from verbal cues, does not like to be cued during actual activity        Exercises Other Exercises Other Exercises: squats performed from hall rail 3x6 Other Exercises: side stepping at hall rail 2x20 ft Other Exercises: calf raises    General Comments        Pertinent Vitals/Pain Pain Assessment Pain Assessment: Faces Faces Pain Scale: Hurts little more Pain Location: back, BLEs Pain Descriptors / Indicators: Discomfort, Guarding Pain Intervention(s): Limited activity within patient's tolerance, Monitored during session, Repositioned    Home Living                          Prior Function            PT Goals (current goals can now be found in the care plan section) Acute Rehab PT Goals PT Goal Formulation: With patient Time For Goal Achievement: 10/27/22 Potential to Achieve Goals: Good Progress towards PT goals: Progressing toward goals    Frequency    Min 5X/week      PT Plan Current plan remains appropriate    Co-evaluation PT/OT/SLP Co-Evaluation/Treatment: Yes Reason for Co-Treatment: For patient/therapist safety;To address functional/ADL transfers PT goals addressed during session: Mobility/safety with mobility;Balance;Proper use of DME OT goals addressed during session: ADL's and self-care      AM-PAC PT "6 Clicks" Mobility   Outcome Measure  Help needed turning from your back to your side while in a flat bed without using bedrails?: A Little Help needed moving from lying on your back to sitting on the side of a flat bed without using bedrails?: A Little Help needed moving to and from a bed to a chair (including a wheelchair)?: A Little Help needed  standing up from a chair using your arms (e.g., wheelchair or bedside chair)?: A Little Help needed to walk in hospital room?: A Little Help needed climbing 3-5 steps with a railing? : A Little 6 Click Score: 18    End of Session Equipment Utilized During Treatment: Back brace;Gait belt Activity Tolerance: Patient tolerated treatment well Patient left: in chair;with call bell/phone within reach Nurse Communication: Mobility status PT Visit Diagnosis: Difficulty in walking, not elsewhere classified (R26.2);Other abnormalities of gait and mobility (R26.89);Muscle weakness (generalized) (M62.81);Pain;Unsteadiness on feet (R26.81) Pain - Right/Left: Left Pain - part of body: Leg     Time: 8295-6213 PT Time Calculation (min) (ACUTE ONLY): 67 min  Charges:    $Gait Training: 8-22 mins $Therapeutic Exercise: 8-22 mins PT General Charges $$ ACUTE PT VISIT: 1 Visit                     Marye Round, PT DPT Acute Rehabilitation Services Secure Chat Preferred  Office [phone]    Wren Pryce Sheliah Plane 10/24/2022, 3:18 PM

## 2022-10-24 NOTE — Plan of Care (Signed)

## 2022-10-24 NOTE — Progress Notes (Signed)
Occupational Therapy Treatment Patient Details Name: David [name] MRN: [id number] DOB: 08-17-1960 Today's Date: 10/24/2022   History of present illness Pt is a 62 y.o. male who presented 08/20/22  with hypoglycemia. 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 5/28 surgical repair: T5-11 posterior/posterolateral arthrodesis  & Transpedicular decompression with partial corpectomy, left T8 & T8-9 laminectomy, bilateral facetectomy. 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 Patient able to stand at sink for grooming tasks with min guard assist without seated rest break. Patient participated in mobility and standing LE exercises with min guard assist.  UE HEP performed with 7 pound bar weights. Acute OT to continue to follow with [facility] program.    Recommendations for follow up therapy are one component of a multi-disciplinary discharge planning process, led by the attending physician.  Recommendations may be updated based on patient status, additional functional criteria and insurance authorization.    Assistance Recommended at Discharge Intermittent Supervision/Assistance  Patient can return home with the following  A little help with walking and/or transfers;A little help with bathing/dressing/bathroom;Assistance with cooking/housework;Assist for transportation;Help with stairs or ramp for entrance   Equipment Recommendations  BSC/3in1;Wheelchair (measurements OT);Wheelchair cushion (measurements  OT);Other (comment)    Recommendations for Other Services      Precautions / Restrictions Precautions Precautions: Fall Required Braces or Orthoses: Spinal Brace Spinal Brace: Thoracolumbosacral orthotic;Applied in sitting position Other Brace: for use when ambulating Restrictions Weight Bearing Restrictions: No RLE Weight Bearing: Weight bearing as tolerated       Mobility Bed Mobility               General bed mobility comments: OOB upon entry    Transfers Overall transfer level: Needs assistance Equipment used: Rolling walker (2 wheels) Transfers: Sit to/from Stand Sit to Stand: Supervision           General transfer comment: supervision to stand from wheelchair, multiple stands throughout session with pt using UEs for slow eccentric lower     Balance               Standing balance comment: stood at sink for grooming tasks                           ADL either performed or assessed with clinical judgement   ADL Overall ADL's : Needs assistance/impaired     Grooming: Wash/dry hands;Oral care;Wash/dry face;Min guard;Standing Grooming Details (indicate cue type and reason): grooming included shaving standing at sink                               General ADL Comments: dressed upon entry and able to stand at sink for grooming    Extremity/Trunk Assessment              Vision       Perception     Praxis      Cognition Arousal/Alertness: Awake/alert Behavior During Therapy: [facility] for tasks assessed/performed  Overall Cognitive Status: Within Functional Limits for tasks assessed Area of Impairment: Problem solving, Safety/judgement, Following commands                         Safety/Judgement: Decreased awareness of safety, Decreased awareness of deficits Awareness: Emergent Problem Solving: Requires verbal cues, Requires tactile cues General Comments: upset over living situation        Exercises  Exercises: Other exercises General Exercises - Upper Extremity Shoulder Flexion: Strengthening, Both, 10 reps, Seated, Bar weights/barbell Bar Weights/Barbell (Shoulder Flexion): Other (comment) (7 lbs) Elbow Flexion: Strengthening, Both, 15 reps, Seated, Bar weights/barbell Bar Weights/Barbell (Elbow Flexion): Other (comment) (7 lbs)    Shoulder Instructions       General Comments      Pertinent Vitals/ Pain       Pain Assessment Pain Assessment: Faces Faces Pain Scale: Hurts little more Pain Location: back, BLEs Pain Descriptors / Indicators: Discomfort, Guarding Pain Intervention(s): Limited activity within patient's tolerance, Monitored during session, Premedicated before session, Repositioned  Home Living                                          Prior Functioning/Environment              Frequency  Min 5X/week        Progress Toward Goals  OT Goals(current goals can now be found in the care plan section)  Progress towards OT goals: Progressing toward goals  Acute Rehab OT Goals Patient Stated Goal: go home OT Goal Formulation: With patient Time For Goal Achievement: 10/24/22 Potential to Achieve Goals: Good ADL Goals Pt Will Perform Grooming: Independently;standing Pt Will Perform Lower Body Bathing: with modified independence;sit to/from stand;with adaptive equipment Pt Will Perform Lower Body Dressing: with modified independence;sit to/from stand;with adaptive equipment Pt Will Transfer to Toilet: with modified independence;ambulating Pt Will Perform Toileting - Clothing Manipulation and hygiene: with modified independence;sit to/from stand Additional ADL Goal #1: Pt to demo ability to gather ADL/IADL items MOD I with most appropriate DME  Plan Discharge plan remains appropriate;Frequency remains appropriate;Frequency needs to be updated    Co-evaluation    PT/OT/SLP Co-Evaluation/Treatment: Yes Reason for Co-Treatment: For  patient/therapist safety;To address functional/ADL transfers PT goals addressed during session: Mobility/safety with mobility;Balance;Proper use of DME OT goals addressed during session: ADL's and self-care      AM-PAC OT "6 Clicks" Daily Activity     Outcome Measure   Help from another person eating meals?: None Help from another person taking care of personal grooming?: A Little Help from another person toileting, which includes using toliet, bedpan, or urinal?: A Little Help from another person bathing (including washing, rinsing, drying)?: A Little Help from another person to put on and taking off regular upper body clothing?: A Little Help from another person to put on and taking off regular lower body clothing?: A Little 6 Click Score: 19    End of Session Equipment Utilized During Treatment: Gait belt;Rolling walker (2 wheels);Other (comment) (whelchair)  OT Visit Diagnosis: Unsteadiness on feet (R26.81);Other abnormalities of gait and mobility (R26.89);Muscle weakness (generalized) (M62.81);Pain Pain - Right/Left:  (BLE) Pain - part of body: Leg   Activity Tolerance Patient tolerated treatment well   Patient Left in chair;with call bell/phone within reach;with family/visitor present   Nurse Communication Mobility status        Time: [phone]  OT Time Calculation (min): 76 min  Charges: OT General Charges $OT Visit: 1 Visit OT Treatments $Self Care/Home Management : 23-37 mins $Therapeutic Activity: 8-22 mins  [name], OTA Acute Rehabilitation Services  Office [phone]   Dewain Penning 10/24/2022, 3:24 PM

## 2022-10-24 NOTE — Telephone Encounter (Signed)
Pharmacy Patient Advocate Encounter  Received notification from [facility] MEDICAID that Prior Authorization for Lidocaine 5% patches  has been DENIED because  .Marland Kitchen     PA #/Case ID/Reference #: [id number]

## 2022-10-24 NOTE — Progress Notes (Signed)
Progress Note   Patient: David [name] EXB:[id number] DOB: 1960/05/27 DOA: 08/20/2022     62 DOS: the patient was seen and examined on 10/24/2022 at 9:25AM      Brief hospital course: David [name] is a 62 y.o. 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 and septic embolism to right popliteal leg; finally underwent right popliteal/TP/peroneal angioplasty then partial amputation R foot --> discharged on oral antibiotics  5/18 Unfortunately readmitted the next day for profound hypoglycemia  5/20 CT chest and MR spine imaging show progression of discitis/vertebral osteomyelitis; NSGY reconsulted, recommended decompression and stabilizing surgery this time 5/21 ID reconsulted 5/24 Palliative care consulted for pain management due to difficult to control pain 5/28  Taken to the OR for thoracic decompression T5-T11 posterior posterolateral arthrodesis etc by Dr. Maisie Fus, intraoperative cultures grew MRSA  6/25 Finished IV antibiotics, transitioned to doxycycline  10/24/2022: Patient seen alongside patient's wife and nurse.  Patient reports that they have lost their current recommendation.  TOC is assisting with safe discharge.     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 - Continue doxycycline "indefinitely" - Outpatient follow-up with infectious disease  - Continue new duloxetine and PTA Lyrica at discharge  10/24/2022: Wean patient off opiates.  Persistent atrial fibrillation (HCC) Rate  controlled - Continue Eliquis - Continue Coreg, digoxin 10/24/2022: Pursue disposition.  Follow-up with PCP and cardiology on discharge.  Chronic systolic CHF (congestive heart failure) (HCC) EF 35-40% in 2020, now resolved to normal Appears euvolemic here - Continue Entresto, digoxin, Coreg, Lasix  PVD (peripheral vascular disease) (HCC) S/p angioplasty in April - Continue atorvastatin, apixaban  Essential hypertension BP controlled - Continue Coreg, furosemide, Entresto  Uncontrolled type 2 diabetes with hyperglycemia and hypoglycemia, insulin long term use Glucoses controlled - Continue home glargine and aspart - Continue duloxetine, Lyrica  Anxiety - Continue Buspar  Acute urinary retention Resolved  Pleural effusion Resolved on repeat CXR  Acute postoperative anemia due to expected blood loss Hgb trending up to 10s now.  Likely some iron deficiency contributing. - Oral iron  Acute toxic encephalopathy At baseline has no cognitive impairment.  Developed encephalopathy (poorly responsive, confused responses) here due to high doses of opiates.  Resolved now.  Hypokalemia Resolved  AKI (acute kidney injury), ruled out   Hypomagnesemia Resolved  Hyponatremia Mild, asymptomatic  Pure hypercholesterolemia - Continue Lipitor   Subjective:  -Patient seen alongside patient's wife and nurse. -No new complaints. -Reports social situation at home.  Tells me they have lost their current accommodation.  Physical Exam: BP (!) 149/97 (BP Location: Left Arm)   Pulse 90   Temp 98.6 F (37 C) (Oral)   Resp 18   Ht 6' (1.829 m)   Wt 73.2 kg   SpO2 97%   BMI 21.89 kg/m   General condition: Patient is not in any distress.  Patient is awake and alert.  Patient looks chronically ill. HEENT: Patient is pale.  No jaundice. Neck: Supple. Lungs: Clear to auscultation. CVS: S1-S2. Abdomen: Soft and nontender. Neuro: Awake and  alert.  Moves all  extremities. Extremities: No leg edema.  Family Communication: Wife    Disposition: Status is: Inpatient The patient is medically stable, does not yet have a safe disposition, [facility] is working towards strengthening him so he can go home on Sunday        Author: Barnetta Chapel, MD 10/24/2022 3:33 PM  For on call review [email or web address].

## 2022-10-25 LAB — GLUCOSE, CAPILLARY
Glucose-Capillary: 158 mg/dL — ABNORMAL HIGH (ref 70–99)
Glucose-Capillary: 222 mg/dL — ABNORMAL HIGH (ref 70–99)
Glucose-Capillary: 161 mg/dL — ABNORMAL HIGH (ref 70–99)
Glucose-Capillary: 179 mg/dL — ABNORMAL HIGH (ref 70–99)

## 2022-10-25 MED ORDER — KETOROLAC TROMETHAMINE 30 MG/ML IJ SOLN
30.0000 mg | Freq: Four times a day (QID) | INTRAMUSCULAR | Status: DC | PRN
  Administered 2022-10-25 – 2022-10-26 (×4): 30 mg via INTRAVENOUS
  Filled 2022-10-25 (×4): qty 1

## 2022-10-25 NOTE — Progress Notes (Signed)
PROGRESS NOTE    David [name]  ZOX:[id number] DOB: 1960/05/04 DOA: 08/20/2022 PCP: [name], MD   Brief Narrative:   [name] is a 62 y.o. 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 and septic embolism to right popliteal leg; finally underwent right popliteal/TP/peroneal angioplasty then partial amputation R foot --> discharged on oral antibiotics   5/18 Unfortunately readmitted the next day for profound hypoglycemia  5/20 CT chest and MR spine imaging show progression of discitis/vertebral osteomyelitis; NSGY reconsulted, recommended decompression and stabilizing surgery this time 5/21 ID reconsulted 5/24 Palliative care consulted for pain management due to difficult to control pain 5/28  Taken to the OR for thoracic decompression T5-T11 posterior posterolateral arthrodesis etc by Dr. Maisie Fus, intraoperative cultures grew MRSA  6/25 Finished IV antibiotics, transitioned to doxycycline   10/25/2022: Patient seen alongside patient's wife and nurse.  Patient reports that they have lost their current accommodation.  TOC is assisting with safe discharge.   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 Problems:   Essential hypertension   Uncontrolled type 2 diabetes with hyperglycemia and hypoglycemia, insulin long term use   Persistent atrial fibrillation (HCC)   Pure hypercholesterolemia   Hyponatremia   Hypomagnesemia   PVD (peripheral vascular disease) (HCC)   AKI (acute kidney injury), ruled out   Hypokalemia   Acute toxic encephalopathy   Acute  postoperative anemia due to expected blood loss   Pleural effusion   Chronic systolic CHF (congestive heart failure) (HCC)   Acute urinary retention   Anxiety   Hypoglycemia  Assessment and Plan:  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 - Continue doxycycline "indefinitely" - Outpatient follow-up with infectious disease   - Continue new duloxetine and PTA Lyrica at discharge  -Demanding IV opiates, continue oral medications as currently ordered -IV Toradol added as needed   Persistent atrial fibrillation (HCC) Rate controlled - Continue Eliquis - Continue Coreg, digoxin 10/25/2022: Pursue disposition.  Follow-up with PCP and cardiology on discharge.   Chronic systolic CHF (congestive heart failure) (HCC) EF 35-40% in 2020, now resolved to normal Appears euvolemic here - Continue Entresto, digoxin, Coreg, Lasix   PVD (peripheral vascular disease) (HCC) S/p angioplasty in April - Continue atorvastatin, apixaban   Essential hypertension BP controlled - Continue Coreg, furosemide, Entresto   Uncontrolled type 2 diabetes with hyperglycemia and hypoglycemia, insulin long term use Glucoses controlled - Continue home glargine and aspart - Continue duloxetine, Lyrica   Anxiety - Continue Buspar   Acute urinary retention Resolved   Pleural effusion Resolved on repeat CXR   Acute postoperative anemia due to expected blood loss Hgb trending up to 10s now.  Likely some iron deficiency contributing. - Oral iron   Acute toxic encephalopathy-resolved At baseline has no cognitive impairment.  Developed encephalopathy (poorly responsive, confused responses) here due to high doses of opiates.  Resolved now.   AKI (acute kidney injury), ruled out    Pure hypercholesterolemia - Continue Lipitor   DVT prophylaxis:Eliquis Code Status:  Full Family  Communication: Spouse at bedside Disposition Plan:  Status is: Inpatient Remains inpatient appropriate because: Need for IV medications.  Working on placement per TOC.  Procedures:  None  Antimicrobials:  Anti-infectives (From admission, onward)    Start     Dose/Rate Route Frequency Ordered Stop   10/21/22 0000  doxycycline (VIBRA-TABS) 100 MG tablet        100 mg Oral Every 12 hours 10/21/22 1117     10/08/22 1400  Urelle (URELLE/URISED) 81 MG tablet 81 mg        1 tablet Oral 4 times daily 10/08/22 1140 10/11/22 1359   09/28/22 1000  doxycycline (VIBRA-TABS) tablet 100 mg        100 mg Oral Every 12 hours 09/01/22 1113     08/31/22 2000  DAPTOmycin (CUBICIN) 650 mg in sodium chloride 0.9 % IVPB        8 mg/kg  83.3 kg 126 mL/hr over 30 Minutes Intravenous Daily 08/31/22 1053 09/27/22 1432   08/30/22 2300  vancomycin (VANCOREADY) IVPB 750 mg/150 mL  Status:  Discontinued        750 mg 150 mL/hr over 60 Minutes Intravenous Every 12 hours 08/30/22 1150 08/31/22 1053   08/30/22 1712  vancomycin (VANCOCIN) powder  Status:  Discontinued          As needed 08/30/22 1714 08/30/22 1756   08/30/22 1142  ceFAZolin (ANCEF) 2-4 GM/100ML-% IVPB  Status:  Discontinued       Note to Pharmacy: [facility], [place]: cabinet override      08/30/22 1142 08/30/22 1300   08/30/22 0600  ceFAZolin (ANCEF) IVPB 2g/100 mL premix        2 g 200 mL/hr over 30 Minutes Intravenous On call to O.R. 08/29/22 1848 08/30/22 1401   08/27/22 1041  vancomycin (VANCOCIN) IVPB 1000 mg/200 mL premix  Status:  Discontinued        1,000 mg 200 mL/hr over 60 Minutes Intravenous Every 12 hours 08/27/22 0802 08/30/22 1150   08/26/22 2300  vancomycin (VANCOREADY) IVPB 1250 mg/250 mL  Status:  Discontinued        1,250 mg 166.7 mL/hr over 90 Minutes Intravenous Every 24 hours 08/26/22 1014 08/27/22 0802   08/23/22 2200  vancomycin (VANCOREADY) IVPB 750 mg/150 mL  Status:  Discontinued        750 mg 150 mL/hr over 60 Minutes  Intravenous Every 12 hours 08/23/22 0938 08/26/22 1014   08/23/22 1030  vancomycin (VANCOREADY) IVPB 1500 mg/300 mL        1,500 mg 150 mL/hr over 120 Minutes Intravenous  Once 08/23/22 0938 08/23/22 1312   08/20/22 2200  doxycycline (VIBRA-TABS) tablet 100 mg  Status:  Discontinued        100 mg Oral 2 times daily 08/20/22 1737 08/23/22 0927      Subjective: Patient seen and evaluated today and states that he is still having quite a bit of pain, but would like to wean his opiate use.  He is asking for some intermittent IV medications to assist with pain management.  Objective: Vitals:   10/25/22 0028 10/25/22 0617 10/25/22 0741 10/25/22 1132  BP: (!) 151/106 130/81 130/86 126/88  Pulse: 90 91 62 71  Resp: 20 19 18 18   Temp: 98 F (36.7 C) 98.4 F (36.9 C) 98.9 F (37.2 C) 99.6 F (37.6 C)  TempSrc: Oral Oral    SpO2: 98% 98% 96% 96%  Weight:      Height:  Intake/Output Summary (Last 24 hours) at 10/25/2022 1217 Last data filed at 10/25/2022 0900 Gross per 24 hour  Intake 250 ml  Output 1700 ml  Net -1450 ml   Filed Weights   10/22/22 0500 10/23/22 0523 10/24/22 0556  Weight: 71.1 kg 73.1 kg 73.2 kg    Examination:  General exam: Appears calm and comfortable  Respiratory system: Clear to auscultation. Respiratory effort normal. Cardiovascular system: S1 & S2 heard, RRR.  Gastrointestinal system: Abdomen is soft Central nervous system: Alert and awake Extremities: No edema Skin: No significant lesions noted Psychiatry: Flat affect.    Data Reviewed: I have personally reviewed following labs and imaging studies  CBC: Recent Labs  Lab 10/22/22 0204  WBC 8.2  NEUTROABS 5.3  HGB 14.0  HCT 41.9  MCV 90.7  PLT 214   Basic Metabolic Panel: Recent Labs  Lab 10/22/22 0204  NA 133*  K 4.4  CL 101  CO2 24  GLUCOSE 181*  BUN 18  CREATININE 1.17  CALCIUM 9.9   GFR: Estimated Creatinine Clearance: 68.6 mL/min (by C-G formula based on SCr of 1.17  mg/dL). Liver Function Tests: No results for input(s): "AST", "ALT", "ALKPHOS", "BILITOT", "PROT", "ALBUMIN" in the last 168 hours. No results for input(s): "LIPASE", "AMYLASE" in the last 168 hours. No results for input(s): "AMMONIA" in the last 168 hours. Coagulation Profile: No results for input(s): "INR", "PROTIME" in the last 168 hours. Cardiac Enzymes: No results for input(s): "CKTOTAL", "CKMB", "CKMBINDEX", "TROPONINI" in the last 168 hours. BNP (last 3 results) No results for input(s): "PROBNP" in the last 8760 hours. HbA1C: No results for input(s): "HGBA1C" in the last 72 hours. CBG: Recent Labs  Lab 10/24/22 1116 10/24/22 1540 10/24/22 2107 10/25/22 0614 10/25/22 1151  GLUCAP 222* 157* 188* 158* 222*   Lipid Profile: No results for input(s): "CHOL", "HDL", "LDLCALC", "TRIG", "CHOLHDL", "LDLDIRECT" in the last 72 hours. Thyroid Function Tests: No results for input(s): "TSH", "T4TOTAL", "FREET4", "T3FREE", "THYROIDAB" in the last 72 hours. Anemia Panel: No results for input(s): "VITAMINB12", "FOLATE", "FERRITIN", "TIBC", "IRON", "RETICCTPCT" in the last 72 hours. Sepsis Labs: No results for input(s): "PROCALCITON", "LATICACIDVEN" in the last 168 hours.  No results found for this or any previous visit (from the past 240 hour(s)).       Radiology Studies: No results found.      Scheduled Meds:  acetaminophen  1,000 mg Oral TID   apixaban  5 mg Oral BID   atorvastatin  80 mg Oral Daily   busPIRone  15 mg Oral BID   carvedilol  3.125 mg Oral BID   celecoxib  100 mg Oral BID   digoxin  125 mcg Oral Daily   doxycycline  100 mg Oral Q12H   DULoxetine  20 mg Oral Daily   famotidine  20 mg Oral BID   feeding supplement  237 mL Oral BID BM   ferrous sulfate  325 mg Oral QODAY   finasteride  5 mg Oral Daily   fluticasone  1 spray Each Nare Daily   furosemide  20 mg Oral Daily   insulin aspart  0-5 Units Subcutaneous QHS   insulin aspart  0-9 Units  Subcutaneous TID WC   insulin glargine-yfgn  20 Units Subcutaneous Daily   lidocaine  1 patch Transdermal Q24H   liver oil-zinc oxide   Topical BID   magnesium oxide  400 mg Oral BID   pantoprazole  40 mg Oral Daily   polyethylene glycol  17 g Oral  BID   pregabalin  75 mg Oral QHS   sacubitril-valsartan  1 tablet Oral BID   senna-docusate  1 tablet Oral BID   tamsulosin  0.4 mg Oral QPC supper   Continuous Infusions:  sodium chloride Stopped (09/15/22 1353)     LOS: 63 days    Time spent: 35 minutes    Michaelia Beilfuss Hoover Brunette, DO [facility]  If 7PM-7AM, please contact night-coverage [email or web address] 10/25/2022, 12:17 PM

## 2022-10-25 NOTE — Progress Notes (Signed)
This chaplain is present for F/U spiritual care in the setting of PT. The chaplain understands the Pt. continues to meet his goals.  The chaplain is appreciative of time with the Pt.  The Pt. reflected on his personal strength with a bit humor and on his perseverance with the assistance of his faith.  The chaplain notes the Pt. is eager to return to PT. The chaplain  is available for F/U spiritual care as needed.  Chaplain Stephanie Acre [phone]

## 2022-10-25 NOTE — Plan of Care (Signed)
Problem: Education: Goal: Understanding of CV disease, CV risk reduction, and recovery process will improve Outcome: Progressing Goal: Individualized Educational Video(s) Outcome: Progressing   Problem: Activity: Goal: Ability to return to baseline activity level will improve Outcome: Progressing   Problem: Cardiovascular: Goal: Ability to achieve and maintain adequate cardiovascular perfusion will improve Outcome: Progressing Goal: Vascular access site(s) Level 0-1 will be maintained Outcome: Progressing   Problem: Health Behavior/Discharge Planning: Goal: Ability to safely manage health-related needs after discharge will improve Outcome: Progressing   Problem: Education: Goal: Knowledge of General Education information will improve Description: Including pain rating scale, medication(s)/side effects and non-pharmacologic comfort measures Outcome: Progressing   Problem: Health Behavior/Discharge Planning: Goal: Ability to manage health-related needs will improve Outcome: Progressing   Problem: Clinical Measurements: Goal: Ability to maintain clinical measurements within normal limits will improve Outcome: Progressing Goal: Will remain free from infection Outcome: Progressing Goal: Diagnostic test results will improve Outcome: Progressing Goal: Respiratory complications will improve Outcome: Progressing Goal: Cardiovascular complication will be avoided Outcome: Progressing   Problem: Activity: Goal: Risk for activity intolerance will decrease Outcome: Progressing   Problem: Nutrition: Goal: Adequate nutrition will be maintained Outcome: Progressing   Problem: Coping: Goal: Level of anxiety will decrease Outcome: Progressing   Problem: Elimination: Goal: Will not experience complications related to bowel motility Outcome: Progressing Goal: Will not experience complications related to urinary retention Outcome: Progressing   Problem: Pain Managment: Goal:  General experience of comfort will improve Outcome: Progressing   Problem: Safety: Goal: Ability to remain free from injury will improve Outcome: Progressing   Problem: Skin Integrity: Goal: Risk for impaired skin integrity will decrease Outcome: Progressing   Problem: Education: Goal: Ability to describe self-care measures that may prevent or decrease complications (Diabetes Survival Skills Education) will improve Outcome: Progressing Goal: Individualized Educational Video(s) Outcome: Progressing   Problem: Coping: Goal: Ability to adjust to condition or change in health will improve Outcome: Progressing   Problem: Fluid Volume: Goal: Ability to maintain a balanced intake and output will improve Outcome: Progressing   Problem: Health Behavior/Discharge Planning: Goal: Ability to identify and utilize available resources and services will improve Outcome: Progressing Goal: Ability to manage health-related needs will improve Outcome: Progressing   Problem: Metabolic: Goal: Ability to maintain appropriate glucose levels will improve Outcome: Progressing   Problem: Nutritional: Goal: Maintenance of adequate nutrition will improve Outcome: Progressing Goal: Progress toward achieving an optimal weight will improve Outcome: Progressing   Problem: Skin Integrity: Goal: Risk for impaired skin integrity will decrease Outcome: Progressing   Problem: Tissue Perfusion: Goal: Adequacy of tissue perfusion will improve Outcome: Progressing   Problem: Education: Goal: Ability to verbalize activity precautions or restrictions will improve Outcome: Progressing Goal: Knowledge of the prescribed therapeutic regimen will improve Outcome: Progressing Goal: Understanding of discharge needs will improve Outcome: Progressing   Problem: Activity: Goal: Ability to avoid complications of mobility impairment will improve Outcome: Progressing Goal: Ability to tolerate increased activity  will improve Outcome: Progressing Goal: Will remain free from falls Outcome: Progressing   Problem: Bowel/Gastric: Goal: Gastrointestinal status for postoperative course will improve Outcome: Progressing   Problem: Clinical Measurements: Goal: Ability to maintain clinical measurements within normal limits will improve Outcome: Progressing Goal: Postoperative complications will be avoided or minimized Outcome: Progressing Goal: Diagnostic test results will improve Outcome: Progressing   Problem: Pain Management: Goal: Pain level will decrease Outcome: Progressing   Problem: Skin Integrity: Goal: Will show signs of wound healing Outcome: Progressing   Problem: Health Behavior/Discharge   Planning: Goal: Identification of resources available to assist in meeting health care needs will improve Outcome: Progressing   Problem: Bladder/Genitourinary: Goal: Urinary functional status for postoperative course will improve Outcome: Progressing   

## 2022-10-25 NOTE — Progress Notes (Signed)
Occupational Therapy Treatment Patient Details Name: David [name] MRN: [id number] DOB: 08-03-1960 Today's Date: 10/25/2022   History of present illness Pt is a 62 y.o. male who presented 08/20/22  with hypoglycemia. 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 5/28 surgical repair: T5-11 posterior/posterolateral arthrodesis  & Transpedicular decompression with partial corpectomy, left T8 & T8-9 laminectomy, bilateral facetectomy. 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 Pt progressing well though Supervision to min guard still required for dynamic standing tasks due to impaired balance and impaired sensation of B feet. Pt reports mobilizing to/from bathroom without assist. Plan to further address standing balance with ADLs/IADLs in next sessions. Based on progress, may meet acute OT goals soon though noted DC location not secured yet.   Recommendations for follow up therapy are one component of a multi-disciplinary discharge planning process, led by the attending physician.  Recommendations may be updated based on patient status, additional functional criteria and insurance authorization.    Assistance Recommended at Discharge PRN  Patient can return home with the following  Assistance with cooking/housework;Assist for transportation;Help with stairs or ramp for entrance   Equipment Recommendations  BSC/3in1;Wheelchair (measurements OT);Wheelchair cushion (measurements OT);Other (comment) (RW)    Recommendations for Other Services      Precautions /  Restrictions Precautions Precautions: Fall;Back Required Braces or Orthoses: Spinal Brace Spinal Brace: Thoracolumbosacral orthotic;Applied in sitting position Restrictions Weight Bearing Restrictions: No       Mobility Bed Mobility               General bed mobility comments: in wheelchair on entry    Transfers Overall transfer level: Needs assistance Equipment used: None Transfers: Sit to/from Stand Sit to Stand: Supervision           General transfer comment: standing at sink, good locking of w/c brakes     Balance Overall balance assessment: Needs assistance Sitting-balance support: Feet supported, No upper extremity supported Sitting balance-Leahy Scale: Good     Standing balance support: Single extremity supported, Bilateral upper extremity supported, During functional activity, No upper extremity supported Standing balance-Leahy Scale: Fair                             ADL either performed or assessed with clinical judgement   ADL Overall ADL's : Needs assistance/impaired     Grooming: Standing;Supervision/safety;Min guard Grooming Details (indicate cue type and reason): min guard for safety for simulated no UE support, reaching to paper towel dispenser, overhead with BUE. min Upper Body Bathing: Set up;Sitting Upper Body Bathing Details (indicate cue type and reason): donning TLSO brace                                Extremity/Trunk Assessment Upper Extremity Assessment Upper Extremity Assessment: Overall WFL for tasks assessed   Lower Extremity Assessment Lower Extremity Assessment: Defer to PT evaluation        Vision   Vision Assessment?: No apparent visual deficits  Perception     Praxis      Cognition Arousal/Alertness: Awake/alert Behavior During Therapy: WFL for tasks assessed/performed Overall Cognitive Status: Within Functional Limits for tasks assessed                                           Exercises      Shoulder Instructions       General Comments      Pertinent Vitals/ Pain       Pain Assessment Pain Assessment: PAINAD Faces Pain Scale: No hurt  Home Living                                          Prior Functioning/Environment              Frequency  Min 5X/week        Progress Toward Goals  OT Goals(current goals can now be found in the care plan section)  Progress towards OT goals: Progressing toward goals  Acute Rehab OT Goals Patient Stated Goal: find a place to go, walk without assist OT Goal Formulation: With patient Time For Goal Achievement: 11/08/22 Potential to Achieve Goals: Good  Plan Discharge plan remains appropriate;Frequency remains appropriate;Frequency needs to be updated    Co-evaluation                 AM-PAC OT "6 Clicks" Daily Activity     Outcome Measure   Help from another person eating meals?: None Help from another person taking care of personal grooming?: A Little Help from another person toileting, which includes using toliet, bedpan, or urinal?: A Little Help from another person bathing (including washing, rinsing, drying)?: A Little Help from another person to put on and taking off regular upper body clothing?: A Little Help from another person to put on and taking off regular lower body clothing?: A Little 6 Click Score: 19    End of Session Equipment Utilized During Treatment: Back brace  OT Visit Diagnosis: Unsteadiness on feet (R26.81);Other abnormalities of gait and mobility (R26.89);Muscle weakness (generalized) (M62.81);Pain Pain - Right/Left: Left Pain - part of body: Leg   Activity Tolerance Patient tolerated treatment well   Patient Left Other (comment) (in wheelchair with OT)   Nurse Communication          Time: 1610-9604 OT Time Calculation (min): 10 min  Charges: OT General Charges $OT Visit: 1 Visit OT Treatments $Therapeutic Activity: 8-22  mins  Bradd Canary, OTR/L Acute Rehab Services Office: [phone]   Lorre Munroe 10/25/2022, 2:13 PM

## 2022-10-25 NOTE — Progress Notes (Signed)
Occupational Therapy Treatment Patient Details Name: David [name] MRN: [id number] DOB: [birth year] Today's Date: 10/25/2022   History of present illness Pt is a 62 y.o. male who presented 08/20/22  with hypoglycemia. 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 5/28 surgical repair: T5-11 posterior/posterolateral arthrodesis  & Transpedicular decompression with partial corpectomy, left T8 & T8-9 laminectomy, bilateral facetectomy. 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 Patient able to donn brace seated in wheelchair and demonstrate standing at sink with no UE support and reaching tasks. Patient performed UE HEP with verbal cues. Tub transfer performed into regular tub with min assist for balance while lifting feet into tub and requiring seated break on shower chair before stepping out of tub. Patient to continue to be followed by acute OT with STAR program to address ADLs and ADL mobility.    Recommendations for follow up therapy are one component of a multi-disciplinary discharge planning process, led by the attending physician.  Recommendations may be updated based on patient status, additional functional criteria and insurance authorization.    Assistance Recommended at Discharge PRN  Patient can return home with the following  Assistance with cooking/housework;Assist for transportation;Help with stairs or ramp for entrance   Equipment Recommendations  BSC/3in1;Wheelchair (measurements OT);Wheelchair cushion (measurements OT);Other (comment) (RW)    Recommendations  for Other Services      Precautions / Restrictions Precautions Precautions: Fall;Back Required Braces or Orthoses: Spinal Brace Spinal Brace: Thoracolumbosacral orthotic;Applied in sitting position Restrictions Weight Bearing Restrictions: No       Mobility Bed Mobility               General bed mobility comments: in wheelchair on entry    Transfers Overall transfer level: Needs assistance Equipment used: None Transfers: Sit to/from Stand Sit to Stand: Supervision, Min assist           General transfer comment: supervision to min guard for transfer, min assist for tub transfer     Balance Overall balance assessment: Needs assistance Sitting-balance support: Feet supported, No upper extremity supported Sitting balance-Leahy Scale: Good     Standing balance support: Single extremity supported, Bilateral upper extremity supported, During functional activity, No upper extremity supported Standing balance-Leahy Scale: Fair Standing balance comment: stands without UE support for short bouts of time                           ADL either performed or assessed with clinical judgement   ADL Overall ADL's : Needs assistance/impaired     Grooming: Standing;Supervision/safety;Min guard Grooming Details (indicate cue type and reason): min guard for safety for simulated no UE support, reaching to paper towel dispenser, overhead with BUE. min Upper Body Bathing: Set up;Sitting Upper Body Bathing Details (indicate cue type and reason): donning TLSO brace     Upper Body Dressing : Set up;Sitting Upper Body Dressing Details (indicate cue type and reason): donning TLSO brace             Tub/ Shower Transfer: Minimal assistance;Tub transfer;Shower Field seismologist Details (indicate  cue type and reason): min assist for balance due to difficulty lifting legs into tub and rested on shower chair before transfer out of tub        Extremity/Trunk  Assessment Upper Extremity Assessment Upper Extremity Assessment: Overall WFL for tasks assessed   Lower Extremity Assessment Lower Extremity Assessment: Defer to PT evaluation        Vision   Vision Assessment?: No apparent visual deficits   Perception     Praxis      Cognition Arousal/Alertness: Awake/alert Behavior During Therapy: WFL for tasks assessed/performed Overall Cognitive Status: Within Functional Limits for tasks assessed                                          Exercises Exercises: General Upper Extremity General Exercises - Upper Extremity Shoulder Flexion: Strengthening, Both, 10 reps, Seated, Bar weights/barbell Bar Weights/Barbell (Shoulder Flexion): Other (comment) (7 pounds) Shoulder ABduction: Strengthening, 15 reps, Seated, Bar weights/barbell Bar Weights/Barbell (Shoulder Abduction): Other (comment) (7 pounds) Elbow Flexion: Strengthening, Both, 15 reps, Seated, Bar weights/barbell Bar Weights/Barbell (Elbow Flexion): Other (comment) (7 lbs) Elbow Extension: Strengthening, Both, 15 reps, Seated, Bar weights/barbell Bar Weights/Barbell (Elbow Extension): Other (comment) (7 lbs) Chair Push Up: Both, 15 reps    Shoulder Instructions       General Comments      Pertinent Vitals/ Pain       Pain Assessment Pain Assessment: Faces Faces Pain Scale: Hurts a little bit Pain Location: back, BLEs Pain Descriptors / Indicators: Discomfort, Guarding Pain Intervention(s): Limited activity within patient's tolerance, Monitored during session, Premedicated before session  Home Living                                          Prior Functioning/Environment              Frequency  Min 5X/week        Progress Toward Goals  OT Goals(current goals can now be found in the care plan section)  Progress towards OT goals: Progressing toward goals  Acute Rehab OT Goals Patient Stated Goal: get stronger OT Goal  Formulation: With patient Time For Goal Achievement: 11/08/22 Potential to Achieve Goals: Good ADL Goals Pt Will Perform Grooming: Independently;standing Pt Will Perform Lower Body Bathing: with modified independence;sit to/from stand;with adaptive equipment Pt Will Perform Lower Body Dressing: with modified independence;sit to/from stand;with adaptive equipment Pt Will Transfer to Toilet: with modified independence;ambulating Pt Will Perform Toileting - Clothing Manipulation and hygiene: with modified independence;sit to/from stand Pt/caregiver will Perform Home Exercise Program: Increased strength;Both right and left upper extremity;Independently;With written HEP provided Additional ADL Goal #1: Pt to demonstrate ability to gather ADL/IADLs items using RW with MOD I and ability to correct LOB. Additional ADL Goal #2: Pt to demonstrate standing tolerance > 15 min during ADLs/IADLs without seated rest break  Plan Discharge plan remains appropriate;Frequency remains appropriate;Frequency needs to be updated    Co-evaluation                 AM-PAC OT "6 Clicks" Daily Activity     Outcome Measure   Help from another person eating meals?: None Help from another person taking care of personal grooming?: A Little Help from another person toileting, which includes using toliet, bedpan, or urinal?: A  Little Help from another person bathing (including washing, rinsing, drying)?: A Little Help from another person to put on and taking off regular upper body clothing?: A Little Help from another person to put on and taking off regular lower body clothing?: A Little 6 Click Score: 19    End of Session Equipment Utilized During Treatment: Back brace;Gait belt;Rolling walker (2 wheels)  OT Visit Diagnosis: Unsteadiness on feet (R26.81);Other abnormalities of gait and mobility (R26.89);Muscle weakness (generalized) (M62.81);Pain Pain - Right/Left: Left Pain - part of body: Leg   Activity  Tolerance Patient tolerated treatment well   Patient Left Other (comment) (left with PT)   Nurse Communication Mobility status        Time: 7829-5621 OT Time Calculation (min): 39 min  Charges: OT General Charges $OT Visit: 1 Visit OT Treatments $Self Care/Home Management : 8-22 mins $Therapeutic Activity: 8-22 mins $Therapeutic Exercise: 8-22 mins  [name], OTA Acute Rehabilitation Services  Office [phone]   Dewain Penning 10/25/2022, 2:38 PM

## 2022-10-25 NOTE — TOC Progression Note (Signed)
Transition of Care [facility]) - Progression Note    Patient Details  Name: David [name] MRN: [id number] Date of Birth: [birth year]  [facility] CM/SW Contact  Janae Bridgeman, RN Phone Number: 10/25/2022, 11:28 AM  Clinical Narrative:    CM met with the patient at the bedside to discuss TOC needs for pending discharge once patient has mobilized significantly with the [facility] Team.  The patient was supposed to move to an apartment over the weekend but plans fell through and now the patient has no money and is homeless with his wife.  I spoke with Raiford Noble, OT and patient is on the STAR Team and will continue to work with the [employer] program until his mobility has improved or patient/wife find a place to live.  Wife is starting a job soon and has pending availability to resources in the next couple of weeks.  Attending MD was notified.  Expected Discharge Plan: Home/Self Care Barriers to Discharge: Inadequate or no insurance, SNF Pending bed offer, SNF Pending payor source - LOG  Expected Discharge Plan and Services   Discharge Planning Services: CM Consult Post Acute Care Choice: Home Health Living arrangements for the past 2 months: Hotel/Motel                                       Social Determinants of Health (SDOH) Interventions SDOH Screenings   Food Insecurity: Patient Unable To Answer (08/23/2022)  Housing: Patient Unable To Answer (08/23/2022)  Transportation Needs: Patient Unable To Answer (08/23/2022)  Utilities: Patient Unable To Answer (08/23/2022)  Depression (PHQ2-9): Low Risk  (11/12/2021)  Stress: Stress Concern Present (09/26/2017)  Tobacco Use: Medium Risk (09/15/2022)    Readmission Risk Interventions    09/06/2021   12:07 PM  Readmission Risk Prevention Plan  Post Dischage Appt Complete  Medication Screening Complete  Transportation Screening Complete

## 2022-10-25 NOTE — Progress Notes (Signed)
Physical Therapy Treatment Patient Details Name: David [name] MRN: [id number] DOB: [birth year] Today's Date: 10/25/2022   History of Present Illness Pt is a 62 y.o. male who presented 08/20/22  with hypoglycemia. 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 5/28 surgical repair: T5-11 posterior/posterolateral arthrodesis  & Transpedicular decompression with partial corpectomy, left T8 & T8-9 laminectomy, bilateral facetectomy. 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 Pt wanted to focus efforts on w/c propulsion and management, LE strengthening, and stair training this date. Pt progressing well, does require rest breaks as needed but no episodes of buckling or extended rests needed today. Pt is pleased with his progress, PT to continue to follow.     Assistance Recommended at Discharge Frequent or constant Supervision/Assistance  If plan is discharge home, recommend the following:  Can travel by private vehicle    A little help with walking and/or transfers;A little help with bathing/dressing/bathroom;Assistance with cooking/housework;Direct supervision/assist for medications management;Assist for transportation;Help with stairs or ramp for entrance      Equipment Recommendations  Wheelchair (measurements PT);Wheelchair cushion (measurements PT)    Recommendations for Other Services       Precautions / Restrictions Precautions Precautions: Fall;Back Required Braces or Orthoses: Spinal Brace Spinal Brace: Thoracolumbosacral  orthotic;Applied in sitting position Restrictions Weight Bearing Restrictions: No     Mobility  Bed Mobility Overal bed mobility: Needs Assistance             General bed mobility comments: in wheelchair on entry    Transfers Overall transfer level: Needs assistance Equipment used:  (hallway railing vs pushing up on wc) Transfers: Sit to/from Stand Sit to Stand: Supervision           General transfer comment: for safety, cues for hand placement. stand x15 from w/c throughout session    Ambulation/Gait               General Gait Details: nt - pt focusing on stair practice and exercise   Stairs   Stairs assistance: Min guard Stair Management: Step to pattern, Forwards, Two rails Number of Stairs: 3 General stair comments: x2, min cues for seqeuncing   Wheelchair Mobility Wheelchair Mobility Wheelchair propulsion: Both upper extremities, Both lower extermities Wheelchair parts: Supervision/cueing Distance: 1000 Wheelchair Assistance Details (indicate cue type and reason): cues for hallway navigation, increased time to perform   Tilt Bed    Modified Rankin (Stroke Patients Only)       Balance Overall balance assessment: Needs assistance Sitting-balance support: Feet supported, No upper extremity supported Sitting balance-Leahy Scale: Good     Standing balance support: Bilateral upper extremity supported, During functional activity Standing balance-Leahy Scale: Fair                              Cognition Arousal/Alertness: Awake/alert Behavior During Therapy: WFL for tasks assessed/performed Overall Cognitive Status: Within Functional Limits for tasks assessed  Exercises Other Exercises Other Exercises: squats performed from hall rail 3x6 Other Exercises: side stepping at hall rail 1x20 ft Other Exercises: calf raises x12    General Comments        Pertinent  Vitals/Pain Pain Assessment Pain Assessment: Faces Faces Pain Scale: Hurts little more Pain Location: back, BLEs Pain Descriptors / Indicators: Discomfort, Guarding Pain Intervention(s): Limited activity within patient's tolerance, Monitored during session, Repositioned    Home Living                          Prior Function            PT Goals (current goals can now be found in the care plan section) Acute Rehab PT Goals PT Goal Formulation: With patient Time For Goal Achievement: 10/27/22 Potential to Achieve Goals: Good Progress towards PT goals: Progressing toward goals    Frequency    Min 1X/week      PT Plan Current plan remains appropriate    Co-evaluation              AM-PAC PT "6 Clicks" Mobility   Outcome Measure  Help needed turning from your back to your side while in a flat bed without using bedrails?: A Little Help needed moving from lying on your back to sitting on the side of a flat bed without using bedrails?: A Little Help needed moving to and from a bed to a chair (including a wheelchair)?: A Little Help needed standing up from a chair using your arms (e.g., wheelchair or bedside chair)?: A Little Help needed to walk in hospital room?: A Little Help needed climbing 3-5 steps with a railing? : A Little 6 Click Score: 18    End of Session Equipment Utilized During Treatment: Back brace;Gait belt Activity Tolerance: Patient tolerated treatment well Patient left: in chair;with call bell/phone within reach Nurse Communication: Mobility status PT Visit Diagnosis: Difficulty in walking, not elsewhere classified (R26.2);Other abnormalities of gait and mobility (R26.89);Muscle weakness (generalized) (M62.81);Pain;Unsteadiness on feet (R26.81) Pain - part of body: Leg     Time: 6962-9528 PT Time Calculation (min) (ACUTE ONLY): 40 min  Charges:    $Therapeutic Exercise: 8-22 mins $Therapeutic Activity: 8-22 mins $Wheel Chair  Management: 8-22 mins PT General Charges $$ ACUTE PT VISIT: 1 Visit                     David [name], PT DPT Acute Rehabilitation Services Secure Chat Preferred  Office [phone]    David [name] [name] 10/25/2022, 3:12 PM

## 2022-10-26 ENCOUNTER — Other Ambulatory Visit (HOSPITAL_COMMUNITY): Payer: Self-pay

## 2022-10-26 DIAGNOSIS — M4644 Discitis, unspecified, thoracic region: Secondary | ICD-10-CM | POA: Diagnosis not present

## 2022-10-26 LAB — GLUCOSE, CAPILLARY
Glucose-Capillary: 142 mg/dL — ABNORMAL HIGH (ref 70–99)
Glucose-Capillary: 109 mg/dL — ABNORMAL HIGH (ref 70–99)
Glucose-Capillary: 178 mg/dL — ABNORMAL HIGH (ref 70–99)
Glucose-Capillary: 146 mg/dL — ABNORMAL HIGH (ref 70–99)
Glucose-Capillary: 194 mg/dL — ABNORMAL HIGH (ref 70–99)

## 2022-10-26 MED ORDER — KETOROLAC TROMETHAMINE 15 MG/ML IJ SOLN
15.0000 mg | Freq: Four times a day (QID) | INTRAMUSCULAR | Status: DC | PRN
  Administered 2022-10-26 – 2022-10-28 (×7): 15 mg via INTRAVENOUS
  Filled 2022-10-26 (×7): qty 1

## 2022-10-26 NOTE — Progress Notes (Signed)
Physical Therapy Treatment Patient Details Name: David [name] MRN: [id number] DOB: 1961-02-18 Today's Date: 10/26/2022   History of Present Illness Pt is a 62 y.o. male who presented 08/20/22  with hypoglycemia. 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 5/28 surgical repair: T5-11 posterior/posterolateral arthrodesis  & Transpedicular decompression with partial corpectomy, left T8 & T8-9 laminectomy, bilateral facetectomy. 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 Reviewed stair training, 5 steps x2 with min guard and bil rail use. Performed dynamic balance and strengthening exercises emphasizing reduced UE support to facilitate improved neuromuscular control. Gait at min guard level. Required several seated rest breaks throughout session. Felt challenge level was appropriate. Getting close to goals which have been extended and updated to maximize safety and function prior to d/c. Patient will continue to benefit from skilled physical therapy services to further improve independence with functional mobility.     Assistance Recommended at Discharge Frequent or constant Supervision/Assistance  If plan is discharge home, recommend the following:  Can travel by private vehicle    A little help with walking and/or transfers;A little help with bathing/dressing/bathroom;Assistance with cooking/housework;Direct supervision/assist for medications management;Assist for transportation;Help with stairs or ramp for entrance   Yes  Equipment Recommendations  Wheelchair (measurements PT);Wheelchair cushion (measurements PT)    Recommendations  for Other Services       Precautions / Restrictions Precautions Precautions: Fall;Back Precaution Booklet Issued: No Required Braces or Orthoses: Spinal Brace Spinal Brace: Thoracolumbosacral orthotic;Applied in sitting position Other Brace: for use when ambulating Restrictions Weight Bearing Restrictions: No RLE Weight Bearing: Weight bearing as tolerated     Mobility  Bed Mobility               General bed mobility comments: In therapy gym finishing with OT training.    Transfers Overall transfer level: Needs assistance Equipment used: Rolling walker (2 wheels) Transfers: Sit to/from Stand Sit to Stand: Supervision           General transfer comment: Supervision for safety from w/c multiple times. Reminded to double check breaks due to leg rests unlocking when he pulls them back.    Ambulation/Gait Ambulation/Gait assistance: Min guard Gait Distance (Feet): 60 Feet Assistive device: Rolling walker (2 wheels) Gait Pattern/deviations: Knee hyperextension - right, Knee hyperextension - left, Step-through pattern, Narrow base of support Gait velocity: decr Gait velocity interpretation: <1.31 ft/sec, indicative of household ambulator   General Gait Details: Cues for proximity to walker. Reduced LE control but adequate for task performed. Cues for awareness and safety. Made several turns with good RW control. Cues for back precautions. States he has walked longer distances previously but declines further today.   Stairs Stairs: Yes Stairs assistance: Min guard Stair Management: Step to pattern, Forwards, Two rails Number of Stairs: 5 (x2) General stair comments: Performed large and small steps 5x2. Teach back for recall. Cues to insure foot is fully on step before WB.   Merchant navy officer propulsion: Both upper extremities, Both lower extermities Wheelchair parts: Supervision/cueing Distance: 375 Wheelchair Assistance Details  (indicate cue type and reason): Cues for awareness of proximity to objects in hallway. Nearly mod  I with bil UEs. Performed almost half of distance with LEs only for LE training.   Tilt Bed    Modified Rankin (Stroke Patients Only)       Balance Overall balance assessment: Needs assistance Sitting-balance support: Feet supported, No upper extremity supported Sitting balance-Leahy Scale: Good   Postural control:  (Secondary to pain) Standing balance support: During functional activity, Single extremity supported, No upper extremity supported Standing balance-Leahy Scale: Fair Standing balance comment: Stable with single UE support, supervision. Does take away hands but needs close guard and shows increased sway, tolerated only short period.                            Cognition Arousal/Alertness: Awake/alert Behavior During Therapy: WFL for tasks assessed/performed Overall Cognitive Status: Within Functional Limits for tasks assessed                                 General Comments: occasional cues for back precautions and reassessing brakes locked on W/c.        Exercises Other Exercises Other Exercises: squats performed from hall rail 2x10 cues for reduced UE support, focusing on center of balance with ea rep. Other Exercises: side stepping at hall rail 1x15 ft Other Exercises: Heel/toe raises x15; held 15th rep x10 seconds, BIL hand support, lightly.    General Comments        Pertinent Vitals/Pain Pain Assessment Pain Assessment: Faces Faces Pain Scale: Hurts little more Pain Location: back, BLEs Pain Descriptors / Indicators: Discomfort, Guarding Pain Intervention(s): Limited activity within patient's tolerance, Monitored during session, Repositioned, Patient requesting pain meds-RN notified    Home Living                          Prior Function            PT Goals (current goals can now be found in the care plan section)  Acute Rehab PT Goals Patient Stated Goal: return home and drum again PT Goal Formulation: With patient Time For Goal Achievement: 11/09/22 Potential to Achieve Goals: Good Progress towards PT goals: Progressing toward goals    Frequency    Min 1X/week      PT Plan Current plan remains appropriate    Co-evaluation              AM-PAC PT "6 Clicks" Mobility   Outcome Measure  Help needed turning from your back to your side while in a flat bed without using bedrails?: A Little Help needed moving from lying on your back to sitting on the side of a flat bed without using bedrails?: A Little Help needed moving to and from a bed to a chair (including a wheelchair)?: A Little Help needed standing up from a chair using your arms (e.g., wheelchair or bedside chair)?: A Little Help needed to walk in hospital room?: A Little Help needed climbing 3-5 steps with a railing? : A Little 6 Click Score: 18    End of Session Equipment Utilized During Treatment: Back brace;Gait belt Activity Tolerance: Patient tolerated treatment well Patient left: in chair;with call bell/phone within reach Nurse Communication: Mobility status;Patient requests pain meds PT Visit Diagnosis: Difficulty in walking, not elsewhere classified (R26.2);Other abnormalities of gait and mobility (R26.89);Muscle weakness (generalized) (M62.81);Pain;Unsteadiness on feet (R26.81) Pain - Right/Left: Left Pain - part of body: Leg  Time: 2956-2130 PT Time Calculation (min) (ACUTE ONLY): 42 min  Charges:    $Gait Training: 8-22 mins $Therapeutic Exercise: 8-22 mins $Therapeutic Activity: 8-22 mins PT General Charges $$ ACUTE PT VISIT: 1 Visit                     Kathlyn Sacramento, PT, DPT [facility]  Rehabilitation Services Physical Therapist Office: [phone] Website: [email or web address]    Berton Mount 10/26/2022, 4:18 PM

## 2022-10-26 NOTE — Progress Notes (Signed)
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  Important events:  5/18 Readmitted the next day for profound hypoglycemia  5/20 CT chest and MR spine imaging show progression of discitis/vertebral osteomyelitis; NSGY reconsulted, recommended decompression and stabilizing surgery this time 5/21 ID reconsulted 5/24 Palliative care consulted for pain management due to difficult to control pain 5/28  Taken to the OR for thoracic decompression T5-T11 posterior posterolateral arthrodesis etc by Dr. Maisie Fus, intraoperative cultures grew MRSA  6/25 Finished IV antibiotics, transitioned to doxycycline  PT/OT recommending outpatient follow-up.  Safe discharge is a concern.  TOC following.  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 Problems:   Essential hypertension   Uncontrolled type 2 diabetes with hyperglycemia and hypoglycemia, insulin long term use   Persistent atrial fibrillation (HCC)   Pure hypercholesterolemia   Hyponatremia   Hypomagnesemia   PVD (peripheral vascular disease) (HCC)   AKI (acute kidney injury), ruled out   Hypokalemia   Acute toxic  encephalopathy   Acute postoperative anemia due to expected blood loss   Pleural effusion   Chronic systolic CHF (congestive heart failure) (HCC)   Acute urinary retention   Anxiety   Hypoglycemia   T7/T9 discitis/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  Now status post amputation.  Finished IV antibiotics course.  Continue doxycycline indefinitely for now.  He will follow-up with infectious disease as an outpatient. On norco,duloxetine, Lyrica also  Permanent A-fib: Rate is controlled.  On Eliquis.  Also on Coreg and digoxin.  Will recommend to follow-up with cardiology as an outpatient.  Chronic systolic CHF: EF of 35 to 40% as per echo in 2020 now resolved to normal.  Appears euvolemic.  On Entresto, digoxin, Coreg, Lasix.  Follow-up with cardiology as an outpatient  Peripheral vascular disease: Status post angioplasty in April.  On Lipitor, Eliquis  Hypertension: Currently blood pressure is well-controlled.  On Coreg, Lasix, Entresto,clonidine  Type 2 diabetes: Takes insulin at home.  Continue current insulin regimen.  History of anxiety: On BuSpar  Normocytic anemia: Currently hemoglobin is stable  Acute toxic encephalopathy: Resolved.  Likely related to opiates.  Hyperlipidemia: On Lipitor  Disposition: Patient is currently homeless.  PT/OT recommending outpatient follow-up on discharge.  STAR team following,TOC assistanting. Most likely the plan will be a shelter.Spouse is awaiting housing        DVT prophylaxis:SCD's Start: 08/30/22 1939 apixaban (ELIQUIS) tablet 5 mg     Code Status: Full Code  Family Communication: Spouse ta bedside  Patient status:Inpatient  Patient is from :homeless  Anticipated discharge to:Shelter  Estimated DC date:1-2 days   Consultants: Neurosurgery, ID  Procedures: As above  Antimicrobials:  Anti-infectives (From admission, onward)    Start  Dose/Rate Route Frequency  Ordered Stop   10/21/22 0000  doxycycline (VIBRA-TABS) 100 MG tablet        100 mg Oral Every 12 hours 10/21/22 1117     10/08/22 1400  Urelle (URELLE/URISED) 81 MG tablet 81 mg        1 tablet Oral 4 times daily 10/08/22 1140 10/11/22 1359   09/28/22 1000  doxycycline (VIBRA-TABS) tablet 100 mg        100 mg Oral Every 12 hours 09/01/22 1113     08/31/22 2000  DAPTOmycin (CUBICIN) 650 mg in sodium chloride 0.9 % IVPB        8 mg/kg  83.3 kg 126 mL/hr over 30 Minutes Intravenous Daily 08/31/22 1053 09/27/22 1432   08/30/22 2300  vancomycin (VANCOREADY) IVPB 750 mg/150 mL  Status:  Discontinued        750 mg 150 mL/hr over 60 Minutes Intravenous Every 12 hours 08/30/22 1150 08/31/22 1053   08/30/22 1712  vancomycin (VANCOCIN) powder  Status:  Discontinued          As needed 08/30/22 1714 08/30/22 1756   08/30/22 1142  ceFAZolin (ANCEF) 2-4 GM/100ML-% IVPB  Status:  Discontinued       Note to Pharmacy: [facility] [place]: cabinet override      08/30/22 1142 08/30/22 1300   08/30/22 0600  ceFAZolin (ANCEF) IVPB 2g/100 mL premix        2 g 200 mL/hr over 30 Minutes Intravenous On call to O.R. 08/29/22 1848 08/30/22 1401   08/27/22 1041  vancomycin (VANCOCIN) IVPB 1000 mg/200 mL premix  Status:  Discontinued        1,000 mg 200 mL/hr over 60 Minutes Intravenous Every 12 hours 08/27/22 0802 08/30/22 1150   08/26/22 2300  vancomycin (VANCOREADY) IVPB 1250 mg/250 mL  Status:  Discontinued        1,250 mg 166.7 mL/hr over 90 Minutes Intravenous Every 24 hours 08/26/22 1014 08/27/22 0802   08/23/22 2200  vancomycin (VANCOREADY) IVPB 750 mg/150 mL  Status:  Discontinued        750 mg 150 mL/hr over 60 Minutes Intravenous Every 12 hours 08/23/22 0938 08/26/22 1014   08/23/22 1030  vancomycin (VANCOREADY) IVPB 1500 mg/300 mL        1,500 mg 150 mL/hr over 120 Minutes Intravenous  Once 08/23/22 0938 08/23/22 1312   08/20/22 2200  doxycycline (VIBRA-TABS) tablet 100 mg  Status:  Discontinued         100 mg Oral 2 times daily 08/20/22 1737 08/23/22 1610       Subjective: Patient seen and examined at bedside today.  Hemodynamically stable.  Sitting at the edge of the bed.  Appears very comfortable.  He says he is ambulatory function has improved.  Still complaining of some back pain and intermittently requires Norco.  He says he is looking forward to move with his spouse who is waiting for paycheck   Objective: Vitals:   10/25/22 2004 10/25/22 2326 10/26/22 0335 10/26/22 0850  BP: (!) 160/103 (!) 146/102 (!) 147/98 (!) 136/96  Pulse: 88 78 61 75  Resp: 18 18 19 18   Temp: 98.3 F (36.8 C) 99.1 F (37.3 C) 98 F (36.7 C) 98.3 F (36.8 C)  TempSrc: Oral Oral Oral   SpO2: 98% 98% 97%   Weight:      Height:        Intake/Output Summary (Last 24 hours) at 10/26/2022 1253 Last data filed at 10/26/2022 9604 Gross per  24 hour  Intake --  Output 950 ml  Net -950 ml   Filed Weights   10/22/22 0500 10/23/22 0523 10/24/22 0556  Weight: 71.1 kg 73.1 kg 73.2 kg    Examination:  General exam: Overall comfortable, not in distress HEENT: PERRL Respiratory system:  no wheezes or crackles  Cardiovascular system: S1 & S2 heard, RRR.  Gastrointestinal system: Abdomen is nondistended, soft and nontender. Central nervous system: Alert and oriented Extremities: No edema, no clubbing ,no cyanosis Skin: No rashes, no ulcers,no icterus     Data Reviewed: I have personally reviewed following labs and imaging studies  CBC: Recent Labs  Lab 10/22/22 0204  WBC 8.2  NEUTROABS 5.3  HGB 14.0  HCT 41.9  MCV 90.7  PLT 214   Basic Metabolic Panel: Recent Labs  Lab 10/22/22 0204  NA 133*  K 4.4  CL 101  CO2 24  GLUCOSE 181*  BUN 18  CREATININE 1.17  CALCIUM 9.9     No results found for this or any previous visit (from the past 240 hour(s)).   Radiology Studies: No results found.  Scheduled Meds:  acetaminophen  1,000 mg Oral TID   apixaban  5 mg Oral BID   atorvastatin   80 mg Oral Daily   busPIRone  15 mg Oral BID   carvedilol  3.125 mg Oral BID   celecoxib  100 mg Oral BID   digoxin  125 mcg Oral Daily   doxycycline  100 mg Oral Q12H   DULoxetine  20 mg Oral Daily   famotidine  20 mg Oral BID   feeding supplement  237 mL Oral BID BM   ferrous sulfate  325 mg Oral QODAY   finasteride  5 mg Oral Daily   fluticasone  1 spray Each Nare Daily   furosemide  20 mg Oral Daily   insulin aspart  0-5 Units Subcutaneous QHS   insulin aspart  0-9 Units Subcutaneous TID WC   insulin glargine-yfgn  20 Units Subcutaneous Daily   lidocaine  1 patch Transdermal Q24H   liver oil-zinc oxide   Topical BID   magnesium oxide  400 mg Oral BID   pantoprazole  40 mg Oral Daily   polyethylene glycol  17 g Oral BID   pregabalin  75 mg Oral QHS   sacubitril-valsartan  1 tablet Oral BID   senna-docusate  1 tablet Oral BID   tamsulosin  0.4 mg Oral QPC supper   Continuous Infusions:  sodium chloride Stopped (09/15/22 1353)     LOS: 64 days   Burnadette Pop, MD [facility] P7/24/2024, 12:53 PM

## 2022-10-26 NOTE — Progress Notes (Signed)
Occupational Therapy Treatment Patient Details Name: David [name] MRN: [id number] DOB: [birth year] Today's Date: 10/26/2022   History of present illness Pt is a 62 y.o. male who presented 08/20/22  with hypoglycemia. 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 5/28 surgical repair: T5-11 posterior/posterolateral arthrodesis  & Transpedicular decompression with partial corpectomy, left T8 & T8-9 laminectomy, bilateral facetectomy. 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 Patient able to ambulate from EOB to sink for grooming tasks with no UE support and leaning on sink to assist with balance. Patient address clothing retrieval with ambulating to sink to retrieve items with min guard assist. Patient educated on tub bench transfer and patient able to return demonstration with min guard assist. Patient continues to make progress and would benefit from further OT to address functional transfers, mobility, and self care. Acute OT to continue to follow with STAR program.    Recommendations for follow up therapy are one component of a multi-disciplinary discharge planning process, led by the attending physician.  Recommendations may be updated based on patient status, additional functional criteria and insurance authorization.    Assistance Recommended at Discharge PRN  Patient can return home with the following  Assistance with cooking/housework;Assist for transportation;Help with stairs or ramp for entrance   Equipment Recommendations   BSC/3in1;Wheelchair (measurements OT);Wheelchair cushion (measurements OT);Other (comment) (RW)    Recommendations for Other Services      Precautions / Restrictions Precautions Precautions: Fall;Back Precaution Booklet Issued: No Required Braces or Orthoses: Spinal Brace Spinal Brace: Thoracolumbosacral orthotic;Applied in sitting position Restrictions Weight Bearing Restrictions: No       Mobility Bed Mobility Overal bed mobility: Needs Assistance             General bed mobility comments: seated on EOB upon entry    Transfers Overall transfer level: Needs assistance Equipment used: Rolling walker (2 wheels) Transfers: Sit to/from Stand Sit to Stand: Min guard           General transfer comment: performed mobility and transfer in room with RW and supervisionfor sit to stands and min guard     Balance Overall balance assessment: Needs assistance Sitting-balance support: Feet supported, No upper extremity supported Sitting balance-Leahy Scale: Good Sitting balance - Comments: able to donn TLSO brace and shoes seated on EOB   Standing balance support: Single extremity supported, Bilateral upper extremity supported, No upper extremity supported, During functional activity Standing balance-Leahy Scale: Fair Standing balance comment: leans on sink with no UE support during self care tasks                           ADL either performed or assessed with clinical judgement   ADL Overall ADL's : Needs assistance/impaired     Grooming: Wash/dry hands;Wash/dry face;Oral care;Supervision/safety;Standing Grooming Details (indicate cue type and reason): leans on sink for support             Lower Body Dressing: Supervision/safety;Sitting/lateral leans Lower Body Dressing Details (indicate cue type and reason): donned shoes seated on  EOB with figure 5 technique         Tub/ Shower Transfer: Min guard;Tub bench Tub/Shower Transfer Details (indicate  cue type and reason): tub bench transfer with min guard assist following demonstration   General ADL Comments: Min guard assist to perform clothing retrieval task with RW    Extremity/Trunk Assessment              Vision       Perception     Praxis      Cognition Arousal/Alertness: Awake/alert Behavior During Therapy: WFL for tasks assessed/performed Overall Cognitive Status: Within Functional Limits for tasks assessed                                 General Comments: occasional cues for back precautions during self care        Exercises      Shoulder Instructions       General Comments      Pertinent Vitals/ Pain       Pain Assessment Pain Assessment: Faces Faces Pain Scale: Hurts little more Pain Location: back, BLEs Pain Descriptors / Indicators: Discomfort, Guarding Pain Intervention(s): Limited activity within patient's tolerance, Monitored during session, Premedicated before session  Home Living                                          Prior Functioning/Environment              Frequency  Min 5X/week        Progress Toward Goals  OT Goals(current goals can now be found in the care plan section)  Progress towards OT goals: Progressing toward goals  Acute Rehab OT Goals Patient Stated Goal: increase balance OT Goal Formulation: With patient Time For Goal Achievement: 11/08/22 Potential to Achieve Goals: Good ADL Goals Pt Will Perform Grooming: Independently;standing Pt Will Perform Lower Body Bathing: with modified independence;sit to/from stand;with adaptive equipment Pt Will Perform Lower Body Dressing: with modified independence;sit to/from stand;with adaptive equipment Pt Will Transfer to Toilet: with modified independence;ambulating Pt Will Perform Toileting - Clothing Manipulation and hygiene: with modified independence;sit to/from stand Pt/caregiver will Perform Home Exercise Program: Increased  strength;Both right and left upper extremity;Independently;With written HEP provided Additional ADL Goal #1: Pt to demonstrate ability to gather ADL/IADLs items using RW with MOD I and ability to correct LOB. Additional ADL Goal #2: Pt to demonstrate standing tolerance > 15 min during ADLs/IADLs without seated rest break  Plan Discharge plan remains appropriate;Frequency remains appropriate;Frequency needs to be updated    Co-evaluation                 AM-PAC OT "6 Clicks" Daily Activity     Outcome Measure   Help from another person eating meals?: None Help from another person taking care of personal grooming?: A Little Help from another person toileting, which includes using toliet, bedpan, or urinal?: A Little Help from another person bathing (including washing, rinsing, drying)?: A Little Help from another person to put on and taking off regular upper body clothing?: A Little Help from another person to put on and taking off regular lower body clothing?: A Little 6 Click Score: 19    End of Session Equipment Utilized During Treatment: Back brace;Gait belt;Rolling walker (2 wheels)  OT Visit Diagnosis: Unsteadiness on feet (R26.81);Other  abnormalities of gait and mobility (R26.89);Muscle weakness (generalized) (M62.81);Pain Pain - Right/Left: Left Pain - part of body: Leg   Activity Tolerance Patient tolerated treatment well   Patient Left Other (comment) (left with PT)   Nurse Communication Mobility status        Time: 4696-2952 OT Time Calculation (min): 38 min  Charges: OT General Charges $OT Visit: 1 Visit OT Treatments $Self Care/Home Management : 23-37 mins $Therapeutic Activity: 8-22 mins  [name], OTA Acute Rehabilitation Services  Office [phone]   Dewain Penning 10/26/2022, 2:26 PM

## 2022-10-27 DIAGNOSIS — M4644 Discitis, unspecified, thoracic region: Secondary | ICD-10-CM | POA: Diagnosis not present

## 2022-10-27 LAB — CBC
Hemoglobin: 12.8 g/dL — ABNORMAL LOW (ref 13.0–17.0)
MCHC: 33.9 g/dL (ref 30.0–36.0)
MCV: 87.3 fL (ref 80.0–100.0)
Platelets: 187 10*3/uL (ref 150–400)
RBC: 4.33 MIL/uL (ref 4.22–5.81)
RDW: 15.8 % — ABNORMAL HIGH (ref 11.5–15.5)
WBC: 7 10*3/uL (ref 4.0–10.5)
nRBC: 0 % (ref 0.0–0.2)

## 2022-10-27 LAB — BASIC METABOLIC PANEL
BUN: 22 mg/dL (ref 8–23)
CO2: 22 mmol/L (ref 22–32)
Calcium: 9.3 mg/dL (ref 8.9–10.3)
Chloride: 99 mmol/L (ref 98–111)
Creatinine, Ser: 1.36 mg/dL — ABNORMAL HIGH (ref 0.61–1.24)
GFR, Estimated: 59 mL/min — ABNORMAL LOW (ref >60)
Glucose, Bld: 202 mg/dL — ABNORMAL HIGH (ref 70–99)
Potassium: 4 mmol/L (ref 3.5–5.1)
Sodium: 130 mmol/L — ABNORMAL LOW (ref 135–145)

## 2022-10-27 LAB — GLUCOSE, CAPILLARY
Glucose-Capillary: 147 mg/dL — ABNORMAL HIGH (ref 70–99)
Glucose-Capillary: 292 mg/dL — ABNORMAL HIGH (ref 70–99)
Glucose-Capillary: 138 mg/dL — ABNORMAL HIGH (ref 70–99)
Glucose-Capillary: 267 mg/dL — ABNORMAL HIGH (ref 70–99)

## 2022-10-27 NOTE — TOC Progression Note (Addendum)
Transition of Care [facility]) - Progression Note    Patient Details  Name: David [name] MRN: [id number] Date of Birth: 1960-11-14  [facility] of [facility]) CM/SW Contact  Dellie Burns [place], [place] Phone Number: 10/27/2022, 2:26 PM  Clinical Narrative:  spoke with pt and explained per MD, pt is medically stable and plan is for dc tomorrow. Pt upset with dc and states "I am not leaving until the 4th!"; "you will have to call the police to get me to leave!"; "I will call [employer]!".   SW offered shelter resources which pt declined stating he needs to be able to sleep in a bed. Pt states "am I supposed to just live in my car?". Pt's wife at bedside and has been staying with pt in his room this admission. Pt reports his wife is starting a job soon and they plan to move in with friends on 8/4 at address [address] [place] [address].    Pt verbalized understanding current plan is for dc tomorrow 7/26.   Dellie Burns, MSW, LCSW [phone] (coverage)      Expected Discharge Plan: Homeless Shelter Barriers to Discharge: Inadequate or no insurance, SNF Pending bed offer, SNF Pending payor source - LOG  Expected Discharge Plan and Services   Discharge Planning Services: CM Consult Post Acute Care Choice: Home Health Living arrangements for the past 2 months: Hotel/Motel                                       Social Determinants of Health (SDOH) Interventions SDOH Screenings   Food Insecurity: Patient Unable To Answer (08/23/2022)  Housing: Patient Unable To Answer (08/23/2022)  Transportation Needs: Patient Unable To Answer (08/23/2022)  Utilities: Patient Unable To Answer (08/23/2022)  Depression (PHQ2-9): Low Risk  (11/12/2021)  Stress: Stress Concern Present (09/26/2017)  Tobacco Use: Medium Risk (09/15/2022)    Readmission Risk Interventions    09/06/2021   12:07 PM  Readmission Risk Prevention Plan  Post Dischage Appt Complete  Medication Screening Complete   Transportation Screening Complete

## 2022-10-27 NOTE — Progress Notes (Signed)
Occupational Therapy Treatment Patient Details Name: David [name] MRN: [id number] DOB: [birth year] Today's Date: 10/27/2022   History of present illness Pt is a 61 y.o. male who presented 08/20/22  with hypoglycemia. 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 5/28 surgical repair: T5-11 posterior/posterolateral arthrodesis  & Transpedicular decompression with partial corpectomy, left T8 & T8-9 laminectomy, bilateral facetectomy. 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 Patient ambulated to sink and performed grooming tasks standing at sink with supervision and using sink for support. Dressing prep performed with RW and reacher ambulating to closet and using RW for holding clothing. Patient to continues to demonstrate gains with balance and self care tasks.    Recommendations for follow up therapy are one component of a multi-disciplinary discharge planning process, led by the attending physician.  Recommendations may be updated based on patient status, additional functional criteria and insurance authorization.    Assistance Recommended at Discharge PRN  Patient can return home with the following  Assistance with cooking/housework;Assist for transportation;Help with stairs or ramp for entrance   Equipment Recommendations  BSC/3in1;Wheelchair (measurements OT);Wheelchair cushion (measurements OT);Other (comment) (RW)    Recommendations for Other Services      Precautions / Restrictions Precautions Precautions: Fall;Back Precaution Booklet Issued: No Required Braces or  Orthoses: Spinal Brace Spinal Brace: Thoracolumbosacral orthotic;Applied in sitting position Restrictions Weight Bearing Restrictions: No RLE Weight Bearing: Weight bearing as tolerated       Mobility Bed Mobility Overal bed mobility: Needs Assistance             General bed mobility comments: seated on EOB    Transfers Overall transfer level: Needs assistance Equipment used: Rolling walker (2 wheels) Transfers: Sit to/from Stand Sit to Stand: Supervision           General transfer comment: supervision for safety using RW and cues for wheelchair safety     Balance Overall balance assessment: Needs assistance Sitting-balance support: Feet supported, No upper extremity supported Sitting balance-Leahy Scale: Good     Standing balance support: During functional activity, Single extremity supported, No upper extremity supported Standing balance-Leahy Scale: Fair Standing balance comment: limited bouts of balance with no UE support, increased stablility with one to 2 extremity support                           ADL either performed or assessed with clinical judgement   ADL Overall ADL's : Needs assistance/impaired     Grooming: Wash/dry hands;Wash/dry face;Oral care;Supervision/safety;Standing Grooming Details (indicate cue type and reason): leans on sink for support         Upper Body Dressing : Set up;Sitting Upper Body Dressing Details (indicate cue type and reason): donning TLSO brace                   General ADL Comments: clothing retrieval from closet with RW and reacher with min guard for safety    Extremity/Trunk Assessment              Vision  Perception     Praxis      Cognition Arousal/Alertness: Awake/alert Behavior During Therapy: WFL for tasks assessed/performed Overall Cognitive Status: Within Functional Limits for tasks assessed                                          Exercises  Exercises: General Upper Extremity General Exercises - Upper Extremity Shoulder Flexion: Strengthening, Both, 10 reps, Seated, Bar weights/barbell Bar Weights/Barbell (Shoulder Flexion): Other (comment) (7 lbs) Shoulder ABduction: Strengthening, 15 reps, Seated, Bar weights/barbell Bar Weights/Barbell (Shoulder Abduction): Other (comment) (7 lbs) Elbow Flexion: Strengthening, Both, 15 reps, Seated, Bar weights/barbell Bar Weights/Barbell (Elbow Flexion): Other (comment) (7 lbs) Elbow Extension: Strengthening, Both, 15 reps, Seated, Bar weights/barbell Bar Weights/Barbell (Elbow Extension): Other (comment) (7 lbs)    Shoulder Instructions       General Comments      Pertinent Vitals/ Pain       Pain Assessment Pain Assessment: Faces Faces Pain Scale: Hurts a little bit Pain Location: back, BLEs Pain Descriptors / Indicators: Discomfort, Guarding Pain Intervention(s): Limited activity within patient's tolerance, Monitored during session, Premedicated before session  Home Living                                          Prior Functioning/Environment              Frequency  Min 5X/week        Progress Toward Goals  OT Goals(current goals can now be found in the care plan section)  Progress towards OT goals: Progressing toward goals  Acute Rehab OT Goals Patient Stated Goal: walk more OT Goal Formulation: With patient Time For Goal Achievement: 11/08/22 Potential to Achieve Goals: Good ADL Goals Pt Will Perform Grooming: Independently;standing Pt Will Perform Lower Body Bathing: with modified independence;sit to/from stand;with adaptive equipment Pt Will Perform Lower Body Dressing: with modified independence;sit to/from stand;with adaptive equipment Pt Will Transfer to Toilet: with modified independence;ambulating Pt Will Perform Toileting - Clothing Manipulation and hygiene: with modified independence;sit to/from stand Pt/caregiver will Perform  Home Exercise Program: Increased strength;Both right and left upper extremity;Independently;With written HEP provided Additional ADL Goal #1: Pt to demonstrate ability to gather ADL/IADLs items using RW with MOD I and ability to correct LOB. Additional ADL Goal #2: Pt to demonstrate standing tolerance > 15 min during ADLs/IADLs without seated rest break  Plan Discharge plan remains appropriate;Frequency remains appropriate;Frequency needs to be updated    Co-evaluation                 AM-PAC OT "6 Clicks" Daily Activity     Outcome Measure   Help from another person eating meals?: None Help from another person taking care of personal grooming?: A Little Help from another person toileting, which includes using toliet, bedpan, or urinal?: A Little Help from another person bathing (including washing, rinsing, drying)?: A Little Help from another person to put on and taking off regular upper body clothing?: A Little Help from another person to put on and taking off regular lower body clothing?: A Little 6 Click Score: 19    End of Session Equipment Utilized During Treatment: Gait belt;Rolling walker (2 wheels);Back brace;Other (comment) (wheelchair)  OT Visit Diagnosis: Unsteadiness on feet (R26.81);Other abnormalities of gait and mobility (R26.89);Muscle weakness (  generalized) (M62.81);Pain Pain - Right/Left: Left Pain - part of body: Leg   Activity Tolerance Patient tolerated treatment well   Patient Left Other (comment) (left with PT)   Nurse Communication Mobility status        Time: 1330-1406 OT Time Calculation (min): 36 min  Charges: OT General Charges $OT Visit: 1 Visit OT Treatments $Self Care/Home Management : 23-37 mins  [name], OTA Acute Rehabilitation Services  Office [phone]   Dewain Penning 10/27/2022, 2:55 PM

## 2022-10-27 NOTE — Progress Notes (Addendum)
Physical Therapy Treatment Patient Details Name: David [name] MRN: [id number] DOB: [birth year] Today's Date: 10/27/2022   History of Present Illness Pt is a 62 y.o. male who presented 08/20/22  with hypoglycemia. 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 5/28 surgical repair: T5-11 posterior/posterolateral arthrodesis  & Transpedicular decompression with partial corpectomy, left T8 & T8-9 laminectomy, bilateral facetectomy. 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 Further progression with strength, balance, and gait training. Ambulating 100 feet with RW, min guard level. Performed dynamic strength and balance exercises along rail in hallway (pt requests HEP and will provide handout.) Performed stair training at min guard level; using bil rails pt without physical assistance and stable, pushed upper limits with single UE support and pt verbalized understanding to safely navigate stairs at this time he should use bil hands with lateral approach or 2 rails if he encounters any stairs. Pt states wife will always assist with steps if they need to navigate. All questions answered. Will continue to progress during admission.     Assistance Recommended at Discharge Intermittent Supervision/Assistance  If plan is discharge home, recommend the following:  Can travel by private vehicle    A little help with walking and/or transfers;A little help with bathing/dressing/bathroom;Assistance with cooking/housework;Direct supervision/assist for medications management;Assist for transportation;Help with stairs or ramp for entrance   Yes   Equipment Recommendations  Wheelchair (measurements PT);Wheelchair cushion (measurements PT)    Recommendations for Other Services       Precautions / Restrictions Precautions Precautions: Fall;Back Precaution Booklet Issued: No Required Braces or Orthoses: Spinal Brace Spinal Brace: Thoracolumbosacral orthotic;Applied in sitting position Other Brace: for use when ambulating Restrictions Weight Bearing Restrictions: No RLE Weight Bearing: Weight bearing as tolerated     Mobility  Bed Mobility               General bed mobility comments: in w/c finishing with OT    Transfers Overall transfer level: Needs assistance Equipment used: Rolling walker (2 wheels) Transfers: Sit to/from Stand Sit to Stand: Supervision           General transfer comment: Supervision for safety, performed multiple times from w/c today, good recall to check breaks.    Ambulation/Gait Ambulation/Gait assistance: Min guard Gait Distance (Feet): 100 Feet Assistive device: Rolling walker (2 wheels) Gait Pattern/deviations: Knee hyperextension - right, Knee hyperextension - left, Step-through pattern, Narrow base of support Gait velocity: decr Gait velocity interpretation: <1.31 ft/sec, indicative of household ambulator   General Gait Details: Min guard for safety, increased distance in hallway today, good RW control and placement this date. No overt buckling or LOB with distance, slower and guarded but without physical assistance. Noticed a little increase in hyperextension and knee instability as he fatigued towards end of distance.   Stairs   Stairs assistance: Min guard Stair Management: Step to pattern, Forwards, Two rails, One rail Left, Sideways Number of Stairs: 5 (x4) General stair comments: Performed forward gait with min guard safely with hands on bil rails, slow and controlled. Progressed with single rail (unsteady and with increased difficulty.) Single rail sideways approach  with bil hand support, fairly  stable. Discussed understanding his limitations, and using best judgement when encountering unsure situations such as stairs with only a single rail.   [employer] officer mobility: Yes Wheelchair propulsion: Both lower extermities Wheelchair parts: Independent Wheelchair Assistance Details (indicate cue type and reason): Ind with w/c mobility through hallways. Encouraged using LEs to propel and navigate around corners to increase LE strength. A little difficulty navigating up gradual slope in hall, needs UEs to complete.   Tilt Bed    Modified Rankin (Stroke Patients Only)       Balance Overall balance assessment: Needs assistance Sitting-balance support: Feet supported, No upper extremity supported Sitting balance-Leahy Scale: Good     Standing balance support: No upper extremity supported Standing balance-Leahy Scale: Fair Standing balance comment: Stands without UE support, min guard for safety, increased sway. More stable with RW                            Cognition Arousal/Alertness: Awake/alert Behavior During Therapy: WFL for tasks assessed/performed Overall Cognitive Status: Within Functional Limits for tasks assessed                                          Exercises Other Exercises Other Exercises: Squats to parallel with emphasis on minimal UE support from rail 2x10 Other Exercises: Lateral steps 15' x2 Other Exercises: Heel/toe raises x15 ea Other Exercises: Backwards steps with RW x20 feet.  Addendum: dropped off below HEP: Access Code: [email or web address] URL: [email or web address] Date: 10/27/2022 Prepared by: Kathlyn Sacramento  Exercises - Side Stepping with Counter Support  - 1 x daily - 7 x weekly - 3 sets - 10 reps - Heel Raises with Counter Support  - 1 x daily - 7 x weekly - 3 sets - 10 reps - Heel Toe Raises with Counter Support  - 1 x daily - 7 x weekly -  3 sets - 10 reps - Mini Squat with Counter Support  - 1 x daily - 7 x weekly - 3 sets - 10 reps - Seated Long Arc Quad  - 1 x daily - 7 x weekly - 3 sets - 10 reps - Wide Stance with Counter Support  - 1 x daily - 7 x weekly - 3 sets - 10 reps - Supine Quad Set  - 1 x daily - 7 x weekly - 3 sets - 10 reps - Seated Gluteal Sets  - 1 x daily - 7 x weekly - 3 sets - 10 reps   General Comments        Pertinent Vitals/Pain Pain Assessment Pain Assessment: Faces Faces Pain Scale: Hurts even more Pain Location: back, BLEs Pain Descriptors / Indicators: Discomfort, Guarding Pain Intervention(s): Monitored during session, Repositioned    Home Living                          Prior Function            PT Goals (current goals can now be found in the care plan section) Acute Rehab PT Goals Patient Stated Goal: return home and drum again PT Goal Formulation: With patient Time For Goal Achievement: 11/09/22 Potential to Achieve Goals: Good Progress towards PT goals: Progressing toward goals    Frequency    Min 1X/week      PT Plan  Current plan remains appropriate    Co-evaluation              AM-PAC PT "6 Clicks" Mobility   Outcome Measure  Help needed turning from your back to your side while in a flat bed without using bedrails?: A Little Help needed moving from lying on your back to sitting on the side of a flat bed without using bedrails?: A Little Help needed moving to and from a bed to a chair (including a wheelchair)?: A Little Help needed standing up from a chair using your arms (e.g., wheelchair or bedside chair)?: A Little Help needed to walk in hospital room?: A Little Help needed climbing 3-5 steps with a railing? : A Little 6 Click Score: 18    End of Session Equipment Utilized During Treatment: Back brace;Gait belt Activity Tolerance: Patient tolerated treatment well Patient left: in chair;with call bell/phone within reach;with family/visitor  present Nurse Communication: Mobility status PT Visit Diagnosis: Difficulty in walking, not elsewhere classified (R26.2);Other abnormalities of gait and mobility (R26.89);Muscle weakness (generalized) (M62.81);Pain;Unsteadiness on feet (R26.81) Pain - Right/Left: Left Pain - part of body: Leg     Time: 2130-8657 PT Time Calculation (min) (ACUTE ONLY): 38 min  Charges:    $Gait Training: 8-22 mins $Therapeutic Activity: 23-37 mins PT General Charges $$ ACUTE PT VISIT: 1 Visit                     Kathlyn Sacramento, PT, DPT [facility]  Rehabilitation Services Physical Therapist Office: [phone] Website: [email or web address]    Berton Mount 10/27/2022, 3:41 PM

## 2022-10-27 NOTE — Plan of Care (Signed)
Problem: Education: Goal: Understanding of CV disease, CV risk reduction, and recovery process will improve 10/27/2022 0348 by Clint Lipps, RN Outcome: Progressing 10/27/2022 0347 by Clint Lipps, RN Outcome: Progressing Goal: Individualized Educational Video(s) 10/27/2022 0348 by Clint Lipps, RN Outcome: Progressing 10/27/2022 0347 by Clint Lipps, RN Outcome: Progressing   Problem: Activity: Goal: Ability to return to baseline activity level will improve 10/27/2022 0348 by Clint Lipps, RN Outcome: Progressing 10/27/2022 0347 by Clint Lipps, RN Outcome: Progressing   Problem: Cardiovascular: Goal: Ability to achieve and maintain adequate cardiovascular perfusion will improve 10/27/2022 0348 by Clint Lipps, RN Outcome: Progressing 10/27/2022 0347 by Clint Lipps, RN Outcome: Progressing Goal: Vascular access site(s) Level 0-1 will be maintained 10/27/2022 0348 by Clint Lipps, RN Outcome: Progressing 10/27/2022 0347 by Clint Lipps, RN Outcome: Progressing   Problem: Health Behavior/Discharge Planning: Goal: Ability to safely manage health-related needs after discharge will improve 10/27/2022 0348 by Clint Lipps, RN Outcome: Progressing 10/27/2022 0347 by Clint Lipps, RN Outcome: Progressing   Problem: Education: Goal: Knowledge of General Education information will improve Description: Including pain rating scale, medication(s)/side effects and non-pharmacologic comfort measures 10/27/2022 0348 by Clint Lipps, RN Outcome: Progressing 10/27/2022 0347 by Clint Lipps, RN Outcome: Progressing   Problem: Health Behavior/Discharge Planning: Goal: Ability to manage health-related needs will improve 10/27/2022 0348 by Clint Lipps, RN Outcome: Progressing 10/27/2022 0347 by Clint Lipps, RN Outcome: Progressing   Problem: Clinical Measurements: Goal: Ability to maintain clinical measurements  within normal limits will improve 10/27/2022 0348 by Clint Lipps, RN Outcome: Progressing 10/27/2022 0347 by Clint Lipps, RN Outcome: Progressing Goal: Will remain free from infection 10/27/2022 0348 by Clint Lipps, RN Outcome: Progressing 10/27/2022 0347 by Clint Lipps, RN Outcome: Progressing Goal: Diagnostic test results will improve 10/27/2022 0348 by Clint Lipps, RN Outcome: Progressing 10/27/2022 0347 by Clint Lipps, RN Outcome: Progressing Goal: Respiratory complications will improve 10/27/2022 0348 by Clint Lipps, RN Outcome: Progressing 10/27/2022 0347 by Clint Lipps, RN Outcome: Progressing Goal: Cardiovascular complication will be avoided 10/27/2022 0348 by Clint Lipps, RN Outcome: Progressing 10/27/2022 0347 by Clint Lipps, RN Outcome: Progressing   Problem: Activity: Goal: Risk for activity intolerance will decrease 10/27/2022 0348 by Clint Lipps, RN Outcome: Progressing 10/27/2022 0347 by Clint Lipps, RN Outcome: Progressing   Problem: Nutrition: Goal: Adequate nutrition will be maintained 10/27/2022 0348 by Clint Lipps, RN Outcome: Progressing 10/27/2022 0347 by Clint Lipps, RN Outcome: Progressing   Problem: Coping: Goal: Level of anxiety will decrease 10/27/2022 0348 by Clint Lipps, RN Outcome: Progressing 10/27/2022 0347 by Clint Lipps, RN Outcome: Progressing   Problem: Elimination: Goal: Will not experience complications related to bowel motility 10/27/2022 0348 by Clint Lipps, RN Outcome: Progressing 10/27/2022 0347 by Clint Lipps, RN Outcome: Progressing Goal: Will not experience complications related to urinary retention 10/27/2022 0348 by Clint Lipps, RN Outcome: Progressing 10/27/2022 0347 by Clint Lipps, RN Outcome: Progressing   Problem: Pain Managment: Goal: General experience of comfort will improve 10/27/2022 0348 by Clint Lipps, RN Outcome: Progressing 10/27/2022 0347 by Clint Lipps, RN Outcome: Progressing   Problem: Safety: Goal: Ability to remain free from injury will improve 10/27/2022 0348 by Clint Lipps, RN Outcome: Progressing 10/27/2022 0347 by Clint Lipps, RN Outcome: Progressing   Problem: Skin Integrity: Goal: Risk for impaired skin  integrity will decrease 10/27/2022 0348 by Clint Lipps, RN Outcome: Progressing 10/27/2022 0347 by Clint Lipps, RN Outcome: Progressing   Problem: Education: Goal: Ability to describe self-care measures that may prevent or decrease complications (Diabetes Survival Skills Education) will improve 10/27/2022 0348 by Clint Lipps, RN Outcome: Progressing 10/27/2022 0347 by Clint Lipps, RN Outcome: Progressing Goal: Individualized Educational Video(s) 10/27/2022 0348 by Clint Lipps, RN Outcome: Progressing 10/27/2022 0347 by Clint Lipps, RN Outcome: Progressing   Problem: Coping: Goal: Ability to adjust to condition or change in health will improve 10/27/2022 0348 by Clint Lipps, RN Outcome: Progressing 10/27/2022 0347 by Clint Lipps, RN Outcome: Progressing   Problem: Fluid Volume: Goal: Ability to maintain a balanced intake and output will improve 10/27/2022 0348 by Clint Lipps, RN Outcome: Progressing 10/27/2022 0347 by Clint Lipps, RN Outcome: Progressing   Problem: Health Behavior/Discharge Planning: Goal: Ability to identify and utilize available resources and services will improve 10/27/2022 0348 by Clint Lipps, RN Outcome: Progressing 10/27/2022 0347 by Clint Lipps, RN Outcome: Progressing Goal: Ability to manage health-related needs will improve 10/27/2022 0348 by Clint Lipps, RN Outcome: Progressing 10/27/2022 0347 by Clint Lipps, RN Outcome: Progressing   Problem: Metabolic: Goal: Ability to maintain appropriate glucose levels will  improve 10/27/2022 0348 by Clint Lipps, RN Outcome: Progressing 10/27/2022 0347 by Clint Lipps, RN Outcome: Progressing   Problem: Nutritional: Goal: Maintenance of adequate nutrition will improve 10/27/2022 0348 by Clint Lipps, RN Outcome: Progressing 10/27/2022 0347 by Clint Lipps, RN Outcome: Progressing Goal: Progress toward achieving an optimal weight will improve 10/27/2022 0348 by Clint Lipps, RN Outcome: Progressing 10/27/2022 0347 by Clint Lipps, RN Outcome: Progressing   Problem: Skin Integrity: Goal: Risk for impaired skin integrity will decrease 10/27/2022 0348 by Clint Lipps, RN Outcome: Progressing 10/27/2022 0347 by Clint Lipps, RN Outcome: Progressing   Problem: Tissue Perfusion: Goal: Adequacy of tissue perfusion will improve 10/27/2022 0348 by Clint Lipps, RN Outcome: Progressing 10/27/2022 0347 by Clint Lipps, RN Outcome: Progressing   Problem: Education: Goal: Ability to verbalize activity precautions or restrictions will improve 10/27/2022 0348 by Clint Lipps, RN Outcome: Progressing 10/27/2022 0347 by Clint Lipps, RN Outcome: Progressing Goal: Knowledge of the prescribed therapeutic regimen will improve 10/27/2022 0348 by Clint Lipps, RN Outcome: Progressing 10/27/2022 0347 by Clint Lipps, RN Outcome: Progressing Goal: Understanding of discharge needs will improve 10/27/2022 0348 by Clint Lipps, RN Outcome: Progressing 10/27/2022 0347 by Clint Lipps, RN Outcome: Progressing   Problem: Activity: Goal: Ability to avoid complications of mobility impairment will improve 10/27/2022 0348 by Clint Lipps, RN Outcome: Progressing 10/27/2022 0347 by Clint Lipps, RN Outcome: Progressing Goal: Ability to tolerate increased activity will improve 10/27/2022 0348 by Clint Lipps, RN Outcome: Progressing 10/27/2022 0347 by Clint Lipps, RN Outcome:  Progressing Goal: Will remain free from falls 10/27/2022 0348 by Clint Lipps, RN Outcome: Progressing 10/27/2022 0347 by Clint Lipps, RN Outcome: Progressing

## 2022-10-27 NOTE — Progress Notes (Signed)
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  Important events:  5/18 Readmitted the next day for profound hypoglycemia  5/20 CT chest and MR spine imaging show progression of discitis/vertebral osteomyelitis; NSGY reconsulted, recommended decompression and stabilizing surgery this time 5/21 ID reconsulted 5/24 Palliative care consulted for pain management due to difficult to control pain 5/28  Taken to the OR for thoracic decompression T5-T11 posterior posterolateral arthrodesis etc by Dr. Maisie Fus, intraoperative cultures grew MRSA  6/25 Finished IV antibiotics, transitioned to doxycycline  PT/OT recommending outpatient follow-up.  Likely plan for  discharge to shelter ,TOC following.  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 Problems:   Essential hypertension   Uncontrolled type 2 diabetes with hyperglycemia and hypoglycemia, insulin long term use   Persistent atrial fibrillation (HCC)   Pure hypercholesterolemia   Hyponatremia   Hypomagnesemia   PVD (peripheral vascular disease) (HCC)   AKI (acute kidney injury), ruled out   Hypokalemia   Acute toxic  encephalopathy   Acute postoperative anemia due to expected blood loss   Pleural effusion   Chronic systolic CHF (congestive heart failure) (HCC)   Acute urinary retention   Anxiety   Hypoglycemia   T7/T9 discitis/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  Now status post amputation.  Finished IV antibiotics course.  Continue doxycycline indefinitely for now.  He will follow-up with infectious disease as an outpatient. On norco,duloxetine, Lyrica also  Permanent A-fib: Rate is controlled.  On Eliquis.  Also on Coreg and digoxin.  Will recommend to follow-up with cardiology as an outpatient.  Chronic systolic CHF: EF of 35 to 40% as per echo in 2020 now resolved to normal.  Appears euvolemic.  On Entresto, digoxin, Coreg, Lasix.  Follow-up with cardiology as an outpatient  Peripheral vascular disease: Status post angioplasty in April.  On Lipitor, Eliquis  Hypertension: Currently blood pressure is well-controlled.  On Coreg, Lasix, Entresto,clonidine  Type 2 diabetes: Takes insulin at home.  Continue current insulin regimen.  History of anxiety: On BuSpar  Normocytic anemia: Currently hemoglobin is stable  Acute toxic encephalopathy: Resolved.  Likely related to opiates.  Hyperlipidemia: On Lipitor  Disposition: Patient is currently homeless.  PT/OT recommending outpatient follow-up on discharge.  [facility] team following,TOC assistanting. Most likely the plan will be a shelter.Spouse is awaiting housing        DVT prophylaxis:SCD's Start: 08/30/22 1939 apixaban (ELIQUIS) tablet 5 mg     Code Status: Full Code  Family Communication: Spouse ta bedside  Patient status:Inpatient  Patient is from :homeless  Anticipated discharge to:Shelter  Estimated DC date:whenever possible.  I have requested TOC members to let us know about the disposition plan.  Patient is medically stable for discharge   Consultants: Neurosurgery,  ID  Procedures: As above  Antimicrobials:  Anti-infectives (From admission, onward)    Start     Dose/Rate Route Frequency Ordered Stop   10/21/22 0000  doxycycline (VIBRA-TABS) 100 MG tablet        100 mg Oral Every 12 hours 10/21/22 1117     10/08/22 1400  Urelle (URELLE/URISED) 81 MG tablet 81 mg        1 tablet Oral 4 times daily 10/08/22 1140 10/11/22 1359   09/28/22 1000  doxycycline (VIBRA-TABS) tablet 100 mg        100 mg Oral Every 12 hours 09/01/22 1113     08/31/22 2000  DAPTOmycin (CUBICIN) 650 mg in sodium chloride 0.9 % IVPB        8 mg/kg  83.3 kg 126 mL/hr over 30 Minutes Intravenous Daily 08/31/22 1053 09/27/22 1432   08/30/22 2300  vancomycin (VANCOREADY) IVPB 750 mg/150 mL  Status:  Discontinued        750 mg 150 mL/hr over 60 Minutes Intravenous Every 12 hours 08/30/22 1150 08/31/22 1053   08/30/22 1712  vancomycin (VANCOCIN) powder  Status:  Discontinued          As needed 08/30/22 1714 08/30/22 1756   08/30/22 1142  ceFAZolin (ANCEF) 2-4 GM/100ML-% IVPB  Status:  Discontinued       Note to Pharmacy: [facility], [place]: cabinet override      08/30/22 1142 08/30/22 1300   08/30/22 0600  ceFAZolin (ANCEF) IVPB 2g/100 mL premix        2 g 200 mL/hr over 30 Minutes Intravenous On call to O.R. 08/29/22 1848 08/30/22 1401   08/27/22 1041  vancomycin (VANCOCIN) IVPB 1000 mg/200 mL premix  Status:  Discontinued        1,000 mg 200 mL/hr over 60 Minutes Intravenous Every 12 hours 08/27/22 0802 08/30/22 1150   08/26/22 2300  vancomycin (VANCOREADY) IVPB 1250 mg/250 mL  Status:  Discontinued        1,250 mg 166.7 mL/hr over 90 Minutes Intravenous Every 24 hours 08/26/22 1014 08/27/22 0802   08/23/22 2200  vancomycin (VANCOREADY) IVPB 750 mg/150 mL  Status:  Discontinued        750 mg 150 mL/hr over 60 Minutes Intravenous Every 12 hours 08/23/22 0938 08/26/22 1014   08/23/22 1030  vancomycin (VANCOREADY) IVPB 1500 mg/300 mL        1,500 mg 150 mL/hr over 120 Minutes  Intravenous  Once 08/23/22 0938 08/23/22 1312   08/20/22 2200  doxycycline (VIBRA-TABS) tablet 100 mg  Status:  Discontinued        100 mg Oral 2 times daily 08/20/22 1737 08/23/22 7829       Subjective: Patient seen and examined at bedside today.  Looks very comfortable.  His spouse was sleeping on this side of the bed.  He complains of severe back pain and was saying that the Norco is not helping and asking for IV Dilaudid.  He says that he will be hospital for next 10 days and is under the [facility] program   Objective: Vitals:   10/27/22 0500 10/27/22 0802 10/27/22 0808 10/27/22 1108  BP:  (!) 178/92 (!) 156/97 111/77  Pulse:  77 62 68  Resp:  (!) 5 16 16   Temp:  98.4 F (36.9 C)  98.4 F (36.9 C)  TempSrc:  Oral  Oral  SpO2:  97% 97% 97%  Weight: 73.5 kg     Height:        Intake/Output Summary (Last  24 hours) at 10/27/2022 1403 Last data filed at 10/27/2022 0803 Gross per 24 hour  Intake 240 ml  Output 850 ml  Net -610 ml   Filed Weights   10/23/22 0523 10/24/22 0556 10/27/22 0500  Weight: 73.1 kg 73.2 kg 73.5 kg    Examination:  General exam: Overall comfortable, not in distress HEENT: PERRL Respiratory system:  no wheezes or crackles  Cardiovascular system: S1 & S2 heard, RRR.  Gastrointestinal system: Abdomen is nondistended, soft and nontender. Central nervous system: Alert and oriented Extremities: No edema, no clubbing ,no cyanosis Skin: No rashes, no ulcers,no icterus     Data Reviewed: I have personally reviewed following labs and imaging studies  CBC: Recent Labs  Lab 10/22/22 0204 10/27/22 0303  WBC 8.2 7.0  NEUTROABS 5.3  --   HGB 14.0 12.8*  HCT 41.9 37.8*  MCV 90.7 87.3  PLT 214 187   Basic Metabolic Panel: Recent Labs  Lab 10/22/22 0204 10/27/22 0303  NA 133* 130*  K 4.4 4.0  CL 101 99  CO2 24 22  GLUCOSE 181* 202*  BUN 18 22  CREATININE 1.17 1.36*  CALCIUM 9.9 9.3     No results found for this or any previous visit (from  the past 240 hour(s)).   Radiology Studies: No results found.  Scheduled Meds:  acetaminophen  1,000 mg Oral TID   apixaban  5 mg Oral BID   atorvastatin  80 mg Oral Daily   busPIRone  15 mg Oral BID   carvedilol  3.125 mg Oral BID   celecoxib  100 mg Oral BID   digoxin  125 mcg Oral Daily   doxycycline  100 mg Oral Q12H   DULoxetine  20 mg Oral Daily   famotidine  20 mg Oral BID   feeding supplement  237 mL Oral BID BM   ferrous sulfate  325 mg Oral QODAY   finasteride  5 mg Oral Daily   fluticasone  1 spray Each Nare Daily   furosemide  20 mg Oral Daily   insulin aspart  0-5 Units Subcutaneous QHS   insulin aspart  0-9 Units Subcutaneous TID WC   insulin glargine-yfgn  20 Units Subcutaneous Daily   lidocaine  1 patch Transdermal Q24H   liver oil-zinc oxide   Topical BID   magnesium oxide  400 mg Oral BID   pantoprazole  40 mg Oral Daily   polyethylene glycol  17 g Oral BID   pregabalin  75 mg Oral QHS   sacubitril-valsartan  1 tablet Oral BID   senna-docusate  1 tablet Oral BID   tamsulosin  0.4 mg Oral QPC supper   Continuous Infusions:  sodium chloride Stopped (09/15/22 1353)     LOS: 65 days   Burnadette Pop, MD [facility] P7/25/2024, 2:03 PM

## 2022-10-28 ENCOUNTER — Other Ambulatory Visit (HOSPITAL_COMMUNITY): Payer: Self-pay

## 2022-10-28 DIAGNOSIS — M4644 Discitis, unspecified, thoracic region: Secondary | ICD-10-CM | POA: Diagnosis not present

## 2022-10-28 LAB — GLUCOSE, CAPILLARY
Glucose-Capillary: 135 mg/dL — ABNORMAL HIGH (ref 70–99)
Glucose-Capillary: 235 mg/dL — ABNORMAL HIGH (ref 70–99)

## 2022-10-28 MED ORDER — LANTUS SOLOSTAR 100 UNIT/ML ~~LOC~~ SOPN
20.0000 [IU] | PEN_INJECTOR | Freq: Every day | SUBCUTANEOUS | 0 refills | Status: DC
  Filled 2022-10-28 – 2022-12-14 (×2): qty 15, 75d supply, fill #0

## 2022-10-28 MED ORDER — CARVEDILOL 3.125 MG PO TABS
3.1250 mg | ORAL_TABLET | Freq: Two times a day (BID) | ORAL | 1 refills | Status: DC
  Filled 2022-10-28: qty 60, 30d supply, fill #0
  Filled 2023-02-06: qty 120, 60d supply, fill #0
  Filled 2023-02-06 (×3): qty 60, 30d supply, fill #0

## 2022-10-28 MED ORDER — SENNOSIDES-DOCUSATE SODIUM 8.6-50 MG PO TABS
1.0000 | ORAL_TABLET | Freq: Two times a day (BID) | ORAL | 0 refills | Status: DC
  Filled 2022-10-28 – 2022-12-15 (×3): qty 30, 15d supply, fill #0

## 2022-10-28 MED ORDER — ATORVASTATIN CALCIUM 80 MG PO TABS
80.0000 mg | ORAL_TABLET | Freq: Every day | ORAL | 1 refills | Status: DC
Start: 2022-10-28 — End: 2023-01-10
  Filled 2022-10-28: qty 30, 30d supply, fill #0

## 2022-10-28 MED ORDER — POLYETHYLENE GLYCOL 3350 17 GM/SCOOP PO POWD
17.0000 g | Freq: Two times a day (BID) | ORAL | 0 refills | Status: DC
  Filled 2022-10-28: qty 238, 7d supply, fill #0

## 2022-10-28 MED ORDER — BUSPIRONE HCL 15 MG PO TABS
15.0000 mg | ORAL_TABLET | Freq: Two times a day (BID) | ORAL | 1 refills | Status: DC
  Filled 2022-10-28 – 2022-11-29 (×2): qty 60, 30d supply, fill #0

## 2022-10-28 MED ORDER — ENTRESTO 49-51 MG PO TABS
1.0000 | ORAL_TABLET | Freq: Two times a day (BID) | ORAL | 1 refills | Status: DC
  Filled 2022-10-28: qty 60, 30d supply, fill #0
  Filled 2023-02-06: qty 120, 60d supply, fill #0
  Filled 2023-02-06 (×3): qty 60, 30d supply, fill #0

## 2022-10-28 MED ORDER — MAGNESIUM OXIDE -MG SUPPLEMENT 400 (240 MG) MG PO TABS
400.0000 mg | ORAL_TABLET | Freq: Every day | ORAL | 0 refills | Status: DC
Start: 2022-10-28 — End: 2023-03-01
  Filled 2022-10-28: qty 120, 120d supply, fill #0

## 2022-10-28 MED ORDER — TAMSULOSIN HCL 0.4 MG PO CAPS
0.4000 mg | ORAL_CAPSULE | Freq: Every day | ORAL | 1 refills | Status: DC
  Filled 2022-10-28: qty 30, 30d supply, fill #0
  Filled 2023-02-06: qty 60, 60d supply, fill #0
  Filled 2023-02-06 (×2): qty 30, 30d supply, fill #0

## 2022-10-28 MED ORDER — APIXABAN 5 MG PO TABS
5.0000 mg | ORAL_TABLET | Freq: Two times a day (BID) | ORAL | 1 refills | Status: AC
Start: 2022-10-28 — End: ?
  Filled 2022-10-28: qty 60, 30d supply, fill #0

## 2022-10-28 MED ORDER — HYDROCODONE-ACETAMINOPHEN 10-325 MG PO TABS
1.0000 | ORAL_TABLET | Freq: Four times a day (QID) | ORAL | 0 refills | Status: DC | PRN
  Filled 2022-10-28: qty 15, 4d supply, fill #0

## 2022-10-28 MED ORDER — FINASTERIDE 5 MG PO TABS
5.0000 mg | ORAL_TABLET | Freq: Every day | ORAL | 1 refills | Status: DC
  Filled 2022-10-28 – 2023-01-27 (×2): qty 30, 30d supply, fill #0
  Filled 2023-03-04: qty 30, 30d supply, fill #1

## 2022-10-28 MED ORDER — DOXYCYCLINE HYCLATE 100 MG PO TABS
100.0000 mg | ORAL_TABLET | Freq: Two times a day (BID) | ORAL | 3 refills | Status: DC
Start: 2022-10-28 — End: 2023-03-20
  Filled 2022-10-28: qty 180, 90d supply, fill #0
  Filled 2022-11-25: qty 60, 30d supply, fill #0
  Filled 2022-12-29: qty 60, 30d supply, fill #1
  Filled 2023-02-06 (×2): qty 60, 30d supply, fill #0
  Filled 2023-02-06 (×2): qty 60, 30d supply, fill #2
  Filled 2023-03-04: qty 60, 30d supply, fill #1

## 2022-10-28 MED ORDER — FUROSEMIDE 40 MG PO TABS
20.0000 mg | ORAL_TABLET | Freq: Every day | ORAL | 1 refills | Status: DC
Start: 2022-10-28 — End: 2023-05-02
  Filled 2022-10-28 – 2022-12-29 (×2): qty 30, 60d supply, fill #0
  Filled 2023-02-06 – 2023-02-28 (×2): qty 30, 60d supply, fill #1

## 2022-10-28 MED ORDER — PREGABALIN 75 MG PO CAPS
75.0000 mg | ORAL_CAPSULE | Freq: Every day | ORAL | 1 refills | Status: DC
Start: 2022-10-28 — End: 2023-01-31
  Filled 2022-10-28 – 2022-11-18 (×3): qty 30, 30d supply, fill #0
  Filled 2022-12-15 – 2022-12-16 (×2): qty 30, 30d supply, fill #1

## 2022-10-28 MED ORDER — LIDOCAINE 4 % EX PTCH
1.0000 | MEDICATED_PATCH | CUTANEOUS | 0 refills | Status: DC
  Filled 2022-10-28: qty 6, 6d supply, fill #0
  Filled 2022-12-15 (×2): qty 6, 6d supply, fill #1

## 2022-10-28 MED ORDER — FAMOTIDINE 20 MG PO TABS
20.0000 mg | ORAL_TABLET | Freq: Two times a day (BID) | ORAL | 1 refills | Status: DC
  Filled 2022-10-28: qty 60, fill #0
  Filled 2023-02-06: qty 60, 30d supply, fill #0
  Filled 2023-02-06: qty 120, 60d supply, fill #0
  Filled 2023-02-06: qty 60, 30d supply, fill #0

## 2022-10-28 MED ORDER — HYDROMORPHONE HCL 1 MG/ML IJ SOLN
0.5000 mg | Freq: Once | INTRAMUSCULAR | Status: AC
  Administered 2022-10-28: 0.5 mg via INTRAVENOUS
  Filled 2022-10-28: qty 0.5

## 2022-10-28 MED ORDER — DIGOXIN 125 MCG PO TABS
125.0000 ug | ORAL_TABLET | Freq: Every day | ORAL | 1 refills | Status: DC
  Filled 2022-10-28 – 2023-01-27 (×2): qty 30, 30d supply, fill #0
  Filled 2023-03-22: qty 30, 30d supply, fill #1

## 2022-10-28 MED ORDER — FERROUS SULFATE 325 (65 FE) MG PO TABS
325.0000 mg | ORAL_TABLET | ORAL | 1 refills | Status: DC
  Filled 2022-10-28: qty 30, 60d supply, fill #0
  Filled 2023-02-28: qty 100, 200d supply, fill #0

## 2022-10-28 MED ORDER — DULOXETINE HCL 20 MG PO CPEP
20.0000 mg | ORAL_CAPSULE | Freq: Every day | ORAL | 1 refills | Status: DC
  Filled 2022-10-28 – 2022-11-29 (×2): qty 30, 30d supply, fill #0

## 2022-10-28 NOTE — Progress Notes (Signed)
PT Cancellation Note  Patient Details Name: David [name] MRN: [id number] DOB: March 10, 1961   Cancelled Treatment:    Reason Eval/Treat Not Completed: Patient declined, no reason specified Declined further PT. Provided HEP yesterday, pt with no additional questions. States he is eating and planning to d/c shortly.   Berton Mount 10/28/2022, 2:15 PM

## 2022-10-28 NOTE — Progress Notes (Signed)
OT Cancellation Note  Patient Details Name: David [name] MRN: [id number] DOB: 1961/02/13   Cancelled Treatment:    Reason Eval/Treat Not Completed: Patient declined, no reason specified (patient declined OT stating he was getting ready to discharge from facility.) [name], OTA Acute Rehabilitation Services  Office [phone]  Dewain Penning 10/28/2022, 2:09 PM

## 2022-10-28 NOTE — TOC Transition Note (Signed)
Transition of Care [facility]) - CM/SW Discharge Note   Patient Details  Name: David [name] MRN: [id number] Date of Birth: 05-21-1960  Transition of Care [facility]) CM/SW Contact:  Deatra Robinson, [place] Phone Number: 10/28/2022, 1:14 PM   Clinical Narrative:  pt medically stable for dc today. Pt and wife plan to live in their car until their housing option available 8/3. Pt declined homeless shelter option. Pt provided with charity wheelchair and meds filled via [facility] pharmacy. TOC signing off at dc.   Dellie Burns, MSW, LCSW [phone] (coverage)      Final next level of care: Home/Self Care Barriers to Discharge: No Barriers Identified   Patient Goals and CMS Choice      Discharge Placement                      Patient and family notified of of transfer: 10/28/22  Discharge Plan and Services Additional resources added to the After Visit Summary for     Discharge Planning Services: CM Consult Post Acute Care Choice: Home Health                               Social Determinants of Health (SDOH) Interventions SDOH Screenings   Food Insecurity: Patient Unable To Answer (08/23/2022)  Housing: Patient Unable To Answer (08/23/2022)  Transportation Needs: Patient Unable To Answer (08/23/2022)  Utilities: Patient Unable To Answer (08/23/2022)  Depression (PHQ2-9): Low Risk  (11/12/2021)  Stress: Stress Concern Present (09/26/2017)  Tobacco Use: Medium Risk (09/15/2022)     Readmission Risk Interventions    09/06/2021   12:07 PM  Readmission Risk Prevention Plan  Post Dischage Appt Complete  Medication Screening Complete  Transportation Screening Complete

## 2022-10-28 NOTE — Discharge Summary (Signed)
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  Readmitted the next day for profound hypoglycemia .CT chest and MR spine imaging show progression of discitis/vertebral osteomyelitis; NSGY reconsulted, recommended decompression and stabilizing surgery this time. ID reconsulted.Palliative care consulted for pain management due to difficult to control pain.Taken to the OR for thoracic decompression T5-T11 posterior posterolateral arthrodesis etc by Dr. Maisie Fus, intraoperative cultures grew MRSA .Finished IV antibiotics, transitioned to doxycycline.PT/OT recommending outpatient follow-up.  Likely plan for  discharge to shelter ,TOC following.  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  Now status post amputation.  Finished IV antibiotics course.  Continue doxycycline indefinitely for now.  He will follow-up with infectious  disease as an outpatient. On norco,duloxetine, Lyrica also   Permanent A-fib: Rate is controlled.  On Eliquis.  Also on Coreg and digoxin.  Will recommend to follow-up with cardiology as an outpatient.   Chronic systolic CHF: EF of 35 to 40% as per echo in 2020 now resolved to normal.  Appears euvolemic.  On Entresto, digoxin, Coreg, Lasix.  Follow-up with cardiology as an outpatient   Peripheral vascular disease: Status post angioplasty in April.  On Lipitor, Eliquis   Hypertension: Currently blood pressure is well-controlled.  On Coreg, Lasix, Entresto,clonidine   Type 2 diabetes: Takes insulin at home.  Continue current insulin regimen.   History of anxiety: On BuSpar   Normocytic anemia: Currently hemoglobin is stable   Acute toxic encephalopathy: Resolved.  Likely related to opiates.   Hyperlipidemia: On Lipitor   Disposition: Patient is currently homeless.  PT/OT recommending outpatient follow-up on discharge. TOC assistanting. 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 Problems:   Essential hypertension   Uncontrolled type 2 diabetes with hyperglycemia and hypoglycemia, insulin long term use   Persistent atrial fibrillation (HCC)   Pure hypercholesterolemia   Hyponatremia   Hypomagnesemia   PVD (peripheral vascular disease) (HCC)   AKI (acute kidney injury), ruled out   Hypokalemia   Acute toxic encephalopathy   Acute postoperative anemia due to expected blood loss   Pleural effusion   Chronic systolic CHF (congestive heart failure) (HCC)   Acute urinary retention   Anxiety   Hypoglycemia    Discharge Instructions  Discharge Instructions     Ambulatory referral to Physical Therapy   Complete by: As directed    Iontophoresis - 4 mg/ml of dexamethasone: No   T.E.N.S. Unit Evaluation and Dispense as Indicated: No   Diet Carb Modified    Complete by: As directed  Discharge instructions   Complete by: As directed    1)Please take prescribed medications and started 2)Follow up with ID as an outpatient.  Follow-up with your PCP in a week   Discharge wound care:   Complete by: As directed    As per wound care nurse   Incentive spirometry RT   Complete by: As directed    Increase activity slowly   Complete by: As directed       Allergies as of 10/28/2022   No Known Allergies      Medication List     STOP taking these medications    aspirin EC 81 MG tablet   HumaLOG KwikPen 100 UNIT/ML KwikPen Generic drug: insulin lispro   oxyCODONE 5 MG immediate release tablet Commonly known as: Oxy IR/ROXICODONE   pantoprazole 40 MG tablet Commonly known as: Protonix       TAKE these medications    apixaban 5 MG Tabs tablet Commonly known as: ELIQUIS Take 1 tablet (5 mg total) by mouth 2 (two) times daily.   atorvastatin 80 MG tablet Commonly known as: LIPITOR Take 1 tablet (80 mg total) by mouth daily.   bisacodyl 10 MG suppository Commonly known as: DULCOLAX Place 1 suppository (10 mg total) rectally daily as needed for moderate constipation.   busPIRone 15 MG tablet Commonly known as: BUSPAR Take 1 tablet (15 mg total) by mouth 2 (two) times daily.   carvedilol 3.125 MG tablet Commonly known as: COREG Take 1 tablet (3.125 mg total) by mouth 2 (two) times daily. What changed:  medication strength how much to take   digoxin 0.125 MG tablet Commonly known as: LANOXIN Take 1 tablet (125 mcg total) by mouth once daily.   doxycycline 100 MG tablet Commonly known as: VIBRA-TABS Take 1 tablet (100 mg total) by mouth every 12 (twelve) hours. What changed: when to take this   DULoxetine 20 MG capsule Commonly known as: CYMBALTA Take 1 capsule (20 mg total) by mouth daily.   Entresto 49-51 MG Generic drug: sacubitril-valsartan Take 1 tablet by mouth 2 (two) times daily. What changed: additional  instructions   famotidine 20 MG tablet Commonly known as: PEPCID Take 1 tablet (20 mg total) by mouth 2 times daily.   feeding supplement Liqd Take 237 mLs by mouth 2 (two) times daily between meals.   ferrous sulfate 325 (65 FE) MG tablet Take 1 tablet (325 mg total) by mouth every other day.   finasteride 5 MG tablet Commonly known as: PROSCAR Take 1 tablet (5 mg total) by mouth daily.   fluticasone 50 MCG/ACT nasal spray Commonly known as: FLONASE Place 2 sprays into both nostrils daily.   furosemide 40 MG tablet Commonly known as: LASIX Take 1/2 tablet (20 mg total) by mouth daily.   HYDROcodone-acetaminophen 10-325 MG tablet Commonly known as: NORCO Take 1 tablet by mouth every 6 (six) hours as needed for severe pain.   Lantus SoloStar 100 UNIT/ML Solostar Pen Generic drug: insulin glargine Inject 20 Units into the skin daily. What changed: how much to take   lidocaine 4 % Place 1 patch onto the skin daily.   liver oil-zinc oxide 40 % ointment Commonly known as: DESITIN Apply topically 2 (two) times daily.   magnesium oxide 400 (240 Mg) MG tablet Commonly known as: MAG-OX Take 1 tablet (400 mg total) by mouth daily.   Narcan 4 MG/0.1ML Liqd nasal spray kit Generic drug: naloxone Opiates overdose   polyethylene glycol powder 17 GM/SCOOP powder Commonly  known as: GLYCOLAX/MIRALAX Dissolve 1 capful (17 g) in water and drink 2 (two) times daily.   pregabalin 75 MG capsule Commonly known as: LYRICA Take 1 capsule (75 mg total) by mouth at bedtime. What changed:  medication strength how much to take when to take this   senna-docusate 8.6-50 MG tablet Commonly known as: Senokot-S Take 1 tablet by mouth 2 (two) times daily.   tamsulosin 0.4 MG Caps capsule Commonly known as: FLOMAX Take 1 capsule (0.4 mg total) by mouth daily after supper.               Durable Medical Equipment  (From admission, onward)           Start     Ordered    10/11/22 1610  For home use only DME standard manual wheelchair with seat cushion  Once       Comments: Patient suffers from weakness which impairs their ability to perform daily activities like bathing, dressing, and grooming in the home.  A walker will not resolve issue with performing activities of daily living. A wheelchair will allow patient to safely perform daily activities. Patient can safely propel the wheelchair in the home or has a caregiver who can provide assistance. Length of need Lifetime. Accessories: elevating leg rests (ELRs), wheel locks, extensions and anti-tippers.   10/11/22 1609              Discharge Care Instructions  (From admission, onward)           Start     Ordered   10/28/22 0000  Discharge wound care:       Comments: As per wound care nurse   10/28/22 1102            Follow-up Information     Medicaid Transportation. Call.   Why: Please call your [employer] to schedule transportation for doctor's appointments. Contact information: [phone]        [facility] Follow up.   Specialty: Rehabilitation Why: Please call the clinic and follow up regarding OUtpatient therapy needs. Contact information: [address] [place] [address] [phone]        [name], MD. Schedule an appointment as soon as possible for a visit in 1 week(s).   Specialty: Family Medicine Contact information: [address] [place] [address] [phone]                No Known Allergies  Consultations: Neurosurgery, ID   Procedures/Studies: DG Chest 2 View  Result Date: 10/17/2022 CLINICAL DATA:  62 year old male with pleural effusions last month. EXAM: CHEST - 2 VIEW COMPARISON:  Chest radiographs 09/16/2022 and earlier. FINDINGS: Upright AP and lateral views of the chest at 1000 hours. Layering and intrapleural fluid has  regressed since last month. Trace if any residual on the right. Bilateral improved ventilation. Decreased pulmonary vascularity, interstitial opacity. Chronic posterior thoracic spinal fusion hardware. Osteopenia. No acute osseous abnormality identified. Mild tortuosity of the descending thoracic aorta. Other mediastinal contours are within normal limits. Visualized tracheal air column is within normal limits. No pneumothorax. Negative visible bowel gas. IMPRESSION: 1. Significantly regressed bilateral pleural effusions and improved bilateral ventilation since last month. 2. No new cardiopulmonary abnormality. Electronically Signed   By: Odessa Fleming M.D.   On: 10/17/2022 12:14      Subjective: Patient seen and examined at bedside today.  Hemodynamically stable lying in bed.  No new complaints  except for back pain.  Discharge planning discussed in detail at bedside.  Patient agreeable for discharge to shelter.  Discharge Exam: Vitals:   10/27/22 2000 10/28/22 0737  BP: (!) 147/86 (!) 143/88  Pulse: 76 80  Resp: 15 18  Temp: 98.7 F (37.1 C) 98 F (36.7 C)  SpO2: 98% 97%   Vitals:   10/27/22 1108 10/27/22 1540 10/27/22 2000 10/28/22 0737  BP: 111/77 (!) 125/90 (!) 147/86 (!) 143/88  Pulse: 68 73 76 80  Resp: 16 20 15 18   Temp: 98.4 F (36.9 C) 98 F (36.7 C) 98.7 F (37.1 C) 98 F (36.7 C)  TempSrc: Oral Oral Oral Oral  SpO2: 97% 98% 98% 97%  Weight:      Height:        General: Pt is alert, awake, not in acute distress Cardiovascular: RRR, S1/S2 +, no rubs, no gallops Respiratory: CTA bilaterally, no wheezing, no rhonchi Abdominal: Soft, NT, ND, bowel sounds + Extremities: no edema, no cyanosis    The results of significant diagnostics from this hospitalization (including imaging, microbiology, ancillary and laboratory) are listed below for reference.     Microbiology: No results found for this or any previous visit (from the past 240 hour(s)).   Labs: BNP (last 3  results) Recent Labs    08/13/22 0253 08/14/22 0341 08/15/22 0400  BNP 477.1* 335.1* 193.9*   Basic Metabolic Panel: Recent Labs  Lab 10/22/22 0204 10/27/22 0303 10/28/22 0237  NA 133* 130* 134*  K 4.4 4.0 4.6  CL 101 99 99  CO2 24 22 26   GLUCOSE 181* 202* 116*  BUN 18 22 25*  CREATININE 1.17 1.36* 1.36*  CALCIUM 9.9 9.3 9.7   Liver Function Tests: No results for input(s): "AST", "ALT", "ALKPHOS", "BILITOT", "PROT", "ALBUMIN" in the last 168 hours. No results for input(s): "LIPASE", "AMYLASE" in the last 168 hours. No results for input(s): "AMMONIA" in the last 168 hours. CBC: Recent Labs  Lab 10/22/22 0204 10/27/22 0303  WBC 8.2 7.0  NEUTROABS 5.3  --   HGB 14.0 12.8*  HCT 41.9 37.8*  MCV 90.7 87.3  PLT 214 187   Cardiac Enzymes: No results for input(s): "CKTOTAL", "CKMB", "CKMBINDEX", "TROPONINI" in the last 168 hours. BNP: Invalid input(s): "POCBNP" CBG: Recent Labs  Lab 10/27/22 0603 10/27/22 1142 10/27/22 1618 10/27/22 2141 10/28/22 0654  GLUCAP 147* 292* 138* 267* 135*   D-Dimer No results for input(s): "DDIMER" in the last 72 hours. Hgb A1c No results for input(s): "HGBA1C" in the last 72 hours. Lipid Profile No results for input(s): "CHOL", "HDL", "LDLCALC", "TRIG", "CHOLHDL", "LDLDIRECT" in the last 72 hours. Thyroid function studies No results for input(s): "TSH", "T4TOTAL", "T3FREE", "THYROIDAB" in the last 72 hours.  Invalid input(s): "FREET3" Anemia work up No results for input(s): "VITAMINB12", "FOLATE", "FERRITIN", "TIBC", "IRON", "RETICCTPCT" in the last 72 hours. Urinalysis    Component Value Date/Time   COLORURINE YELLOW 09/05/2022 1259   APPEARANCEUR CLEAR 09/05/2022 1259   LABSPEC 1.020 09/05/2022 1259   PHURINE 5.0 09/05/2022 1259   GLUCOSEU NEGATIVE 09/05/2022 1259   HGBUR NEGATIVE 09/05/2022 1259   BILIRUBINUR SMALL (A) 09/05/2022 1259   BILIRUBINUR negative 09/25/2019 1555   KETONESUR NEGATIVE 09/05/2022 1259    PROTEINUR 100 (A) 09/05/2022 1259   UROBILINOGEN 1.0 09/25/2019 1555   NITRITE NEGATIVE 09/05/2022 1259   LEUKOCYTESUR TRACE (A) 09/05/2022 1259   Sepsis Labs Recent Labs  Lab 10/22/22 0204 10/27/22 0303  WBC 8.2 7.0   Microbiology No  results found for this or any previous visit (from the past 240 hour(s)).  Please note: You were cared for by a hospitalist during your hospital stay. Once you are discharged, your primary care physician will handle any further medical issues. Please note that NO REFILLS for any discharge medications will be authorized once you are discharged, as it is imperative that you return to your primary care physician (or establish a relationship with a primary care physician if you do not have one) for your post hospital discharge needs so that they can reassess your need for medications and monitor your lab values.    Time coordinating discharge: 40 minutes  SIGNED:   Burnadette Pop, MD  [facility] 10/28/2022, 11:02 AM Pager [phone]  If 7PM-7AM, please contact night-coverage [email or web address] Password [email or web address]

## 2022-10-28 NOTE — Plan of Care (Signed)
  Problem: Activity: Goal: Ability to return to baseline activity level will improve Outcome: Progressing   Problem: Activity: Goal: Risk for activity intolerance will decrease Outcome: Progressing   Problem: Nutrition: Goal: Adequate nutrition will be maintained Outcome: Progressing   Problem: Coping: Goal: Level of anxiety will decrease Outcome: Progressing   Problem: Pain Managment: Goal: General experience of comfort will improve Outcome: Progressing   Problem: Skin Integrity: Goal: Risk for impaired skin integrity will decrease Outcome: Progressing

## 2022-10-28 NOTE — Progress Notes (Signed)
Pt wheeled off unit by this RN. Will be stopping by [facility] pharmacy to pick up patients home meds. Wheel chair that belongs to patients will be held behind the nurses station patient will be back tomorrow to pick up wheel chair. Pt is informed that is imperative to pick chair up tomorrow and that he will not be given an additional chair. Chair is tagged and labeled and Charge RN is aware of location

## 2022-10-31 ENCOUNTER — Other Ambulatory Visit: Payer: Self-pay

## 2022-10-31 ENCOUNTER — Encounter: Payer: Self-pay | Admitting: Family

## 2022-10-31 ENCOUNTER — Other Ambulatory Visit (HOSPITAL_COMMUNITY): Payer: Self-pay

## 2022-10-31 ENCOUNTER — Telehealth: Payer: Self-pay

## 2022-10-31 ENCOUNTER — Ambulatory Visit: Payer: Medicaid Other | Attending: Family | Admitting: Family

## 2022-10-31 VITALS — BP 160/95 | HR 86 | Wt 158.6 lb

## 2022-10-31 DIAGNOSIS — Z794 Long term (current) use of insulin: Secondary | ICD-10-CM

## 2022-10-31 DIAGNOSIS — I480 Paroxysmal atrial fibrillation: Secondary | ICD-10-CM

## 2022-10-31 DIAGNOSIS — M546 Pain in thoracic spine: Secondary | ICD-10-CM

## 2022-10-31 DIAGNOSIS — Z09 Encounter for follow-up examination after completed treatment for conditions other than malignant neoplasm: Secondary | ICD-10-CM

## 2022-10-31 DIAGNOSIS — E119 Type 2 diabetes mellitus without complications: Secondary | ICD-10-CM | POA: Diagnosis not present

## 2022-10-31 DIAGNOSIS — I10 Essential (primary) hypertension: Secondary | ICD-10-CM

## 2022-10-31 DIAGNOSIS — I4891 Unspecified atrial fibrillation: Secondary | ICD-10-CM | POA: Diagnosis not present

## 2022-10-31 DIAGNOSIS — Z7901 Long term (current) use of anticoagulants: Secondary | ICD-10-CM

## 2022-10-31 MED ORDER — HYDROCODONE-ACETAMINOPHEN 10-325 MG PO TABS
1.0000 | ORAL_TABLET | Freq: Four times a day (QID) | ORAL | 0 refills | Status: AC | PRN
Start: 2022-10-31 — End: 2022-11-07

## 2022-10-31 MED ORDER — APIXABAN 5 MG PO TABS
5.0000 mg | ORAL_TABLET | Freq: Two times a day (BID) | ORAL | 1 refills | Status: DC
  Filled 2022-10-31: qty 60, 30d supply, fill #0

## 2022-10-31 MED ORDER — HYDROCODONE-ACETAMINOPHEN 10-325 MG PO TABS
1.0000 | ORAL_TABLET | Freq: Four times a day (QID) | ORAL | 0 refills | Status: DC | PRN
  Filled 2022-10-31: qty 15, 4d supply, fill #0

## 2022-10-31 NOTE — Progress Notes (Signed)
/  David [name], is a 62 y.o. male  ZDG:[id number]  [id number]  DOB - 03/08/1961  Subjective:  Chief Complaint and HPI: David [name] is a 62 y.o. male here today accompanied by his wife for hospital discharge follow-up. Per patient, he was admitted to the local hospital on Aug 20, 2022 and had back surgery thereafter. Patient was then readmitted after being discharged for infection with low BG. He was discharged on October 28, 2022 on oral antibiotics with changes to his insulin regimen. His short acting insulin was discontinued. Prior to the admission, patient reports he was taking short acting insulin 3 times per day with meals and Lantus (on 20 units) once daily. Patient believes he took more than prescribed of the short acting insulin, which affected his glucose levels. His blood sugar was found to be 36 mg/dl at home, he was then readmitted to the hospital and discharged on 10/28/2022.  He has a past medical history of PVD, type 2 diabetes with hyperglycemia/hypoglycemia, seizure disorders, hypertension, hyperlipidemia, A-fib (on Eliquis) and recent bacteremia.  He is s/p angioplasty.  Patient reports since discharge he has been doing physical therapy/exercises at home but continues to have severe back pain. He was given pain medication (Norco 10-325 mg) at discharge, with instructions to follow-up with physical therapy or rehab services. Patient says he has an appointment with a Medicare representative this morning for disability, rehab eligibility.  His pain is 8 out of 10 on a scale of 0-10 to his mid back and gets worse with activities. Patient takes Cymbalta as well.  He reports blood sugars of 200 mg/dL this morning after eating breakfast. Most recent hemoglobin A1c was 5.5% a few weeks ago.  He reports no polyuria, polydipsia, polyphagia, chest pain or shortness of breath this morning.  ED/Hospital notes reviewed.   Social History: Reviewed Family history: Viewed  ROS:   Constitutional:  No  f/c, No night sweats, No unexplained weight loss. EENT:  No vision changes, No blurry vision, No hearing changes. No mouth, throat, or ear problems.  Respiratory: No cough, No SOB Cardiac: No CP, no palpitations GI:  No abd pain, No N/V/D. GU: No Urinary s/sx Musculoskeletal: back pain of 8/10 Neuro: No headache, no dizziness, no motor weakness.  Skin: No rash Endocrine:  No polydipsia. No polyuria.  Psych: Denies SI/HI  No problems updated.  ALLERGIES: No Known Allergies  PAST MEDICAL HISTORY: Past Medical History:  Diagnosis Date   Cardiomyopathy (HCC)    a. EF 45% in 2019.   CHF (congestive heart failure) (HCC)    Chronic anticoagulation 09/30/2017   Diabetes mellitus type 2 in nonobese [facility]    Discitis of thoracic region 09/25/2022   Does not have health insurance    Essential hypertension 09/26/2017   Financial difficulties    H/O noncompliance with medical treatment, presenting hazards to health    Hardware complicating wound infection (HCC) 09/25/2022   Non-insulin treated type 2 diabetes mellitus (HCC) 09/26/2017   Persistent atrial fibrillation (HCC)    Sleep apnea suspected 09/30/2017    MEDICATIONS AT HOME: Prior to Admission medications   Medication Sig Start Date End Date Taking? Authorizing Provider  apixaban (ELIQUIS) 5 MG TABS tablet Take 1 tablet (5 mg total) by mouth 2 (two) times daily. 10/28/22   Burnadette Pop, MD  atorvastatin (LIPITOR) 80 MG tablet Take 1 tablet (80 mg total) by mouth daily. 10/28/22   Burnadette Pop, MD  bisacodyl (DULCOLAX) 10 MG suppository Place 1 suppository (10  mg total) rectally daily as needed for moderate constipation. 10/21/22   Kathlen Mody, MD  busPIRone (BUSPAR) 15 MG tablet Take 1 tablet (15 mg total) by mouth 2 (two) times daily. 10/28/22   Burnadette Pop, MD  carvedilol (COREG) 3.125 MG tablet Take 1 tablet (3.125 mg total) by mouth 2 (two) times daily. 10/28/22   Burnadette Pop, MD  digoxin (LANOXIN) 0.125 MG tablet  Take 1 tablet (125 mcg total) by mouth once daily. 10/28/22   Burnadette Pop, MD  doxycycline (VIBRA-TABS) 100 MG tablet Take 1 tablet (100 mg total) by mouth every 12 (twelve) hours. 10/28/22   Burnadette Pop, MD  DULoxetine (CYMBALTA) 20 MG capsule Take 1 capsule (20 mg total) by mouth daily. 10/28/22   Burnadette Pop, MD  famotidine (PEPCID) 20 MG tablet Take 1 tablet (20 mg total) by mouth 2 times daily. 10/28/22   Burnadette Pop, MD  feeding supplement (ENSURE ENLIVE / ENSURE PLUS) LIQD Take 237 mLs by mouth 2 (two) times daily between meals. 10/21/22 01/13/23  Kathlen Mody, MD  ferrous sulfate 325 (65 FE) MG tablet Take 1 tablet (325 mg total) by mouth every other day. 10/28/22   Burnadette Pop, MD  finasteride (PROSCAR) 5 MG tablet Take 1 tablet (5 mg total) by mouth daily. 10/28/22   Burnadette Pop, MD  fluticasone (FLONASE) 50 MCG/ACT nasal spray Place 2 sprays into both nostrils daily. 10/21/22   Kathlen Mody, MD  furosemide (LASIX) 40 MG tablet Take 1/2 tablet (20 mg total) by mouth daily. 10/28/22   Burnadette Pop, MD  HYDROcodone-acetaminophen (NORCO) 10-325 MG tablet Take 1 tablet by mouth every 6 (six) hours as needed for severe pain. 10/28/22   Burnadette Pop, MD  insulin glargine (LANTUS SOLOSTAR) 100 UNIT/ML Solostar Pen Inject 20 Units into the skin daily. 10/28/22   Burnadette Pop, MD  lidocaine 4 % Place 1 patch onto the skin daily. 10/28/22   Burnadette Pop, MD  liver oil-zinc oxide (DESITIN) 40 % ointment Apply topically 2 (two) times daily. 10/21/22   Kathlen Mody, MD  magnesium oxide (MAG-OX) 400 (240 Mg) MG tablet Take 1 tablet (400 mg total) by mouth daily. 10/28/22   Burnadette Pop, MD  naloxone [facility]) nasal spray 4 mg/0.1 mL Opiates overdose 10/21/22   Kathlen Mody, MD  polyethylene glycol powder (GLYCOLAX/MIRALAX) 17 GM/SCOOP powder Dissolve 1 capful (17 g) in water and drink 2 (two) times daily. 10/28/22   Burnadette Pop, MD  pregabalin (LYRICA) 75 MG capsule Take 1  capsule (75 mg total) by mouth at bedtime. 10/28/22   Burnadette Pop, MD  sacubitril-valsartan (ENTRESTO) 49-51 MG Take 1 tablet by mouth 2 (two) times daily. 10/28/22   Burnadette Pop, MD  senna-docusate (SENOKOT-S) 8.6-50 MG tablet Take 1 tablet by mouth 2 (two) times daily. 10/28/22   Burnadette Pop, MD  tamsulosin (FLOMAX) 0.4 MG CAPS capsule Take 1 capsule (0.4 mg total) by mouth daily after supper. 10/28/22   Burnadette Pop, MD     Objective:  EXAM:   Vitals:   10/31/22 0913  BP: (!) 160/95  Pulse: 86  SpO2: 100%  Weight: 158 lb 9.6 oz (71.9 kg)   General appearance : A&OX3. NAD. Non-toxic-appearing HEENT: Atraumatic and Normocephalic.   Mouth-MMM, post pharynx WNL w/o erythema, No PND. Neck: supple, no JVD. No cervical lymphadenopathy. No thyromegaly Chest/Lungs:  Breathing-non-labored, Good air entry bilaterally, breath sounds normal without rales, rhonchi, or wheezing  CVS: Irregular heart rates (Hx of A-fib). No murmurs, gallops, rubs  Abdomen: Bowel  sounds present, Non tender and not distended with no gaurding, rigidity or rebound. Extremities: Bilateral Lower Ext shows no edema, both legs are warm to touch with = pulse throughout Musculoskeletal: Pain to back appreciated with palpation.  Patient unable to ambulate due to pain.  Uses a wheelchair. Psych:  TP linear. J/I WNL. Normal speech. Appropriate eye contact and affect.  Skin:  No Rash  Data Review Lab Results  Component Value Date   HGBA1C 5.5 10/08/2022   HGBA1C 9.1 (H) 07/13/2022   HGBA1C 8.8 (H) 12/14/2021    Assessment    David [name] is a 62 year old male who presents to the clinic this morning for hospital discharge follow-up.  He was recently admitted for back surgery and was readmitted after his blood sugar was found to be in critical levels, he was also found to have infection (Hx of MRSA), currently taking doxycycline by mouth every day.  Patient requires physical therapy or rehab services.  He continues  to have severe back pain which limits his activities.  Patient with history of type 2 diabetes has a most recent hemoglobin A1c of 5.5%. He takes Eliquis 5 mg by mouth twice a day for A-fib.  His blood pressure is high this morning, believes it is the effect of pain from his recent surgery to his back.  PLAN: 1. Hospital discharge follow-up  -Referred to rehab services  -Take all the medications as prescribed  -Report new or worsening symptoms to the clinic or local emergency room/calling 911.  -Pain medication refilled.  2. Uncontrolled type 2 diabetes with hyperglycemia and hypoglycemia, insulin long term use -Monitor your blood sugars 3 times per day, report consistent blood glucose less than 70 or greater than 250 to the clinic or local emergency room. -Take your insulin as prescribed (Lantus 20 units daily) -Eat plant-based diet, increase physical activities as tolerated.. -Schedule for eye exam.  3. Atrial fibrillation with RVR (HCC) -Eliquis refilled, patient instructed to take the medication as prescribed. -Report new or worsening symptoms to the clinic or local emergency room/calling 911. -Follow-up with your primary care doctor in 34-month.  4.  Thoracic back pain, unspecified back pain:  -Norco 10/325 mg tablet, take 1 tablet by mouth every 6 hours as needed for pain for total of 7 days.  Do not take more than prescribed.  -Take Tylenol as needed for pain.  -Patient referred to rehab for further management of back pain.  -Patient reminded to report new or worsening symptoms to the clinic or local emergency room.  Verbalized understanding.  -Follow-up with primary care services in 2 months.  5. Essential Hypertension:  -Patient believes his pain is affecting his blood pressure.   -Instructed to continue taking his blood pressure medications as ordered.  -Follow-up with primary care providers in 2 months  -Decrease diet salt intake.    Return in about 2 months (around  01/01/2023).  The patient was given clear instructions to go to ER or return to medical center if symptoms don't improve, worsen or new problems develop. The patient verbalized understanding. The patient was told to call to get lab results if they haven't heard anything in the next week.    Eleonore Chiquito, DNP, APRN, FNP-C [facility] [place], [place] [phone]   10/31/2022, 10:21 AM

## 2022-10-31 NOTE — Patient Instructions (Addendum)
1) Referred to rehab services initiated 2) Report new or worsening symptoms to the clinic or local emergency room/calling 911. 3) Norco 10/325 mg tablet, take 1 tablet by mouth every 6 hours as needed for pain for total of 7 days.  Do not take more than prescribed.  -Take Tylenol as needed for pain.  4) Follow up in 2 months

## 2022-10-31 NOTE — Telephone Encounter (Signed)
Patient had a hospital follow up appointment this morning with provider at [facility].

## 2022-11-02 ENCOUNTER — Other Ambulatory Visit: Payer: Self-pay | Admitting: Family Medicine

## 2022-11-02 DIAGNOSIS — M546 Pain in thoracic spine: Secondary | ICD-10-CM

## 2022-11-02 DIAGNOSIS — Z09 Encounter for follow-up examination after completed treatment for conditions other than malignant neoplasm: Secondary | ICD-10-CM

## 2022-11-02 NOTE — Telephone Encounter (Signed)
Medication Refill - Medication: HYDROcodone-acetaminophen (NORCO) 10-325 MG tablet   Has the patient contacted their pharmacy? No.   Preferred Pharmacy (with phone number or street name):  [place] [facility] Phone: [phone]  Fax: [phone]      Has the patient been seen for an appointment in the last year OR does the patient have an upcoming appointment? Yes.    Please assist patient further as he only has a few left as he only got 15 pills last time and trying to heal from a major surgery

## 2022-11-03 ENCOUNTER — Other Ambulatory Visit: Payer: Self-pay

## 2022-11-03 NOTE — Telephone Encounter (Signed)
Requested medication (s) are due for refill today: no  Requested medication (s) are on the active medication list: yes  Last refill:  10/31/22  Future visit scheduled: yes  Notes to clinic:  Unable to refill per protocol, last refill by another provider. Unable to refuse medication routing for refusal.     Requested Prescriptions  Pending Prescriptions Disp Refills   HYDROcodone-acetaminophen (NORCO) 10-325 MG tablet 15 tablet 0    Sig: Take 1 tablet by mouth every 6 (six) hours as needed for up to 7 days for severe pain.     Not Delegated - Analgesics:  Opioid Agonist Combinations Failed - 11/02/2022  4:01 PM      Failed - This refill cannot be delegated      Failed - Urine Drug Screen completed in last 360 days      Passed - Valid encounter within last 3 months    Recent Outpatient Visits           3 days ago Hospital discharge follow-up   [facility] [place], Jomarie Longs, FNP   10 months ago Type 2 diabetes mellitus with hyperglycemia, with long-term current use of insulin [facility])   [place] [facility] [place], [place], [place]   1 year ago Type 2 diabetes mellitus with hyperglycemia, with long-term current use of insulin [facility])   [place] [facility] [place], Shea Stakes, NP   2 years ago Essential hypertension   [facility] [place], [place] W, NP   3 years ago Diabetes mellitus type 2, uncontrolled, with complications [facility])   [place] [facility] [place], Marzella Schlein, [place]       Future Appointments             In 2 months [name], MD [facility]

## 2022-11-04 ENCOUNTER — Other Ambulatory Visit: Payer: Self-pay

## 2022-11-04 ENCOUNTER — Ambulatory Visit: Payer: Self-pay | Admitting: *Deleted

## 2022-11-04 ENCOUNTER — Telehealth: Payer: Self-pay | Admitting: Family Medicine

## 2022-11-04 NOTE — Telephone Encounter (Signed)
  Chief Complaint: severe back pain out of hydrocodone  Symptoms: severe pain middle back  Frequency: since 10/31/22 Pertinent Negatives: Patient denies inability to walk Disposition: [] ED /[] Urgent Care (no appt availability in office) / [] Appointment(In office/virtual)/ []  [place] [facility]/ [] Home Care/ [] Refused Recommended Disposition /[] [facility]/ [x]  Follow-up with PCP Additional Notes:   Requesting refill of hydrocodone. Last refill 10/31/22 for 15 tablets. Patient now at [facility] and awaiting refill for pain medication.  Please advise.    Reason for Disposition  [1] SEVERE back pain (e.g., excruciating, unable to do any normal activities) AND [2] not improved 2 hours after pain medicine  Answer Assessment - Initial Assessment Questions 1. ONSET: "When did the pain begin?"      S/p back surgery continued pain since 10/31/22 2. LOCATION: "Where does it hurt?" (upper, mid or lower back)     Mid back  3. SEVERITY: "How bad is the pain?"  (e.g., Scale 1-10; mild, moderate, or severe)   - MILD (1-3): Doesn't interfere with normal activities.    - MODERATE (4-7): Interferes with normal activities or awakens from sleep.    - SEVERE (8-10): Excruciating pain, unable to do any normal activities.      Severe level 10  4. PATTERN: "Is the pain constant?" (e.g., yes, no; constant, intermittent)      Na  5. RADIATION: "Does the pain shoot into your legs or somewhere else?"     Na  6. CAUSE:  "What do you think is causing the back pain?"      S/p back surgery  7. BACK OVERUSE:  "Any recent lifting of heavy objects, strenuous work or exercise?"     Na  8. MEDICINES: "What have you taken so far for the pain?" (e.g., nothing, acetaminophen, NSAIDS)     Hydrocodone  9. NEUROLOGIC SYMPTOMS: "Do you have any weakness, numbness, or problems with bowel/bladder control?"     Na  10. OTHER SYMPTOMS: "Do you have any other symptoms?" (e.g., fever, abdomen pain, burning with  urination, blood in urine)       Able to walk with walker but in severe pain  11. PREGNANCY: "Is there any chance you are pregnant?" "When was your last menstrual period?"       na  Protocols used: Back Pain-A-AH

## 2022-11-04 NOTE — Telephone Encounter (Signed)
Requested medication (s) are due for refill today - provider review   Requested medication (s) are on the active medication list -yes  Future visit scheduled -yes  Last refill: 10/31/22 #15  Notes to clinic: non delegated Rx  Requested Prescriptions  Pending Prescriptions Disp Refills   HYDROcodone-acetaminophen (NORCO) 10-325 MG tablet 15 tablet 0    Sig: Take 1 tablet by mouth every 6 (six) hours as needed for severe pain.     Not Delegated - Analgesics:  Opioid Agonist Combinations Failed - 11/04/2022  9:49 AM      Failed - This refill cannot be delegated      Failed - Urine Drug Screen completed in last 360 days      Passed - Valid encounter within last 3 months    Recent Outpatient Visits           4 days ago Hospital discharge follow-up   [facility] [place], Jomarie Longs, FNP   10 months ago Type 2 diabetes mellitus with hyperglycemia, with long-term current use of insulin [facility])   [facility] [place], [place], [place]   1 year ago Type 2 diabetes mellitus with hyperglycemia, with long-term current use of insulin [facility])   [place] [facility] [place], Shea Stakes, NP   2 years ago Essential hypertension   [place] [facility] [place], [place] W, NP   3 years ago Diabetes mellitus type 2, uncontrolled, with complications [facility])   [place] [facility] [place], [place], [place]       Future Appointments             In 2 months [name], MD [facility]               Requested Prescriptions  Pending Prescriptions Disp Refills   HYDROcodone-acetaminophen (NORCO) 10-325 MG tablet 15 tablet 0    Sig: Take 1 tablet by mouth every 6 (six) hours as needed for severe pain.     Not Delegated - Analgesics:  Opioid Agonist Combinations Failed - 11/04/2022  9:49 AM      Failed - This refill cannot be  delegated      Failed - Urine Drug Screen completed in last 360 days      Passed - Valid encounter within last 3 months    Recent Outpatient Visits           4 days ago Hospital discharge follow-up   [facility] [place], Jomarie Longs, FNP   10 months ago Type 2 diabetes mellitus with hyperglycemia, with long-term current use of insulin [facility])   [place] [facility] [place], [place], [place]   1 year ago Type 2 diabetes mellitus with hyperglycemia, with long-term current use of insulin [facility])   [place] [facility] [place], Shea Stakes, NP   2 years ago Essential hypertension   [place] [facility] [place], [place] W, NP   3 years ago Diabetes mellitus type 2, uncontrolled, with complications [facility])    [facility] [place], Marzella [place]lein, [place]       Future Appointments             In 2 months [name], MD [facility]

## 2022-11-04 NOTE — Telephone Encounter (Signed)
Patient in the office today voiced that he was told by Barbara Cower that his PCP was in the office today and  we would be able to approve his med and he would be to get them today. Advised patient that PCP is not in the office today . His PCP is not in the office on Friday's . Patient was adamant That he was told that he could get his pain medication today. Voices that he is in pain and he need pain medication. Advised patient that we have no open appointments today. Advised patient that if his pain is that severe that he should  to go to UC or the ED. Patient left the office and voicing  that he was going to the ED

## 2022-11-07 ENCOUNTER — Telehealth: Payer: Self-pay | Admitting: Family Medicine

## 2022-11-07 ENCOUNTER — Other Ambulatory Visit: Payer: Self-pay

## 2022-11-07 ENCOUNTER — Ambulatory Visit: Payer: Self-pay | Admitting: *Deleted

## 2022-11-07 NOTE — Telephone Encounter (Signed)
Reason for Disposition  [1] Caller has URGENT medicine question about med that PCP or specialist prescribed AND [2] triager unable to answer question  Answer Assessment - Initial Assessment Questions 1. NAME of MEDICINE: "What medicine(s) are you calling about?"     I'm calling about the pain medicine   Hydrocodone.   I just got out of the hospital last week. 2. QUESTION: "What is your question?" (e.g., double dose of medicine, side effect)     They gave me 15 pills.   I've been out of medicine since Friday.   3. PRESCRIBER: "Who prescribed the medicine?" Reason: if prescribed by specialist, call should be referred to that group.      4. SYMPTOMS: "Do you have any symptoms?" If Yes, ask: "What symptoms are you having?"  "How bad are the symptoms (e.g., mild, moderate, severe)     Extreme pain 5. PREGNANCY:  "Is there any chance that you are pregnant?" "When was your last menstrual period?"     N/A  Protocols used: Medication Question Call-A-AH

## 2022-11-07 NOTE — Telephone Encounter (Signed)
  Chief Complaint: Pt calling again about his hydrocodone. Symptoms: In pain and needing the hydrocodone. Frequency: Has called since Friday several times Pertinent Negatives: Patient denies N/A Disposition: [] ED /[] Urgent Care (no appt availability in office) / [] Appointment(In office/virtual)/ []  [place] [facility]/ [] Home Care/ [] Refused Recommended Disposition /[] [facility]/ [x]  Follow-up with PCP Additional Notes: Message has already been sent regarding this request.    I let him know I would send another message letting her know he had called back.

## 2022-11-07 NOTE — Telephone Encounter (Signed)
Pt called back about his pain medication saying he really needs it sent in to the pharmacy.

## 2022-11-07 NOTE — Telephone Encounter (Signed)
Pt is calling in because he wants to know what is holding up his refill for HYDROcodone-acetaminophen (NORCO) 10-325 MG tablet [[id number]] , pt says he had back surgery and is in pain and needs this medication. Pt says he has called multiple times and still hasn't been able to get this medication. Pt is requesting someone give him a call regarding this matter as soon as possible. Please follow up with pt.

## 2022-11-07 NOTE — Telephone Encounter (Signed)
Spoke with patient . Verified name & DOB    Per my previous note for 11/04/2022 . I asked the patient had he go to UC as we discussed on Friday. patient voiced that he did not voiced that he can not afford to go there. Patient as [employer] medicaid.    Patient voices that he is in very bad pain and needs something to help with it. Advised that we do not have any open this week with our providers . Advised ED. patient voiced he does not wanted to go there.  Patient has been in rehab and has not been scheduled for a follow-up with the neurosurgeon that preformed his surgery. Called placed to neurosurgeon appointment obtained for 11/09/2022 at 11:00. Patient is aware and agrees to attending appointment . Address and contact information given.  Advised that if pain continues or worsen before going to his appointment please go to the ED or call 911.  Call patient to refer him to the medicaid expansion program located at [address]   unable to reach message left on VM.

## 2022-11-07 NOTE — Telephone Encounter (Signed)
See previous note

## 2022-11-09 ENCOUNTER — Other Ambulatory Visit (HOSPITAL_COMMUNITY): Payer: Self-pay

## 2022-11-09 ENCOUNTER — Other Ambulatory Visit: Payer: Self-pay

## 2022-11-09 ENCOUNTER — Telehealth: Payer: Self-pay

## 2022-11-09 MED ORDER — METHOCARBAMOL 750 MG PO TABS
750.0000 mg | ORAL_TABLET | Freq: Three times a day (TID) | ORAL | 2 refills | Status: DC | PRN
Start: 2022-11-09 — End: 2023-02-28
  Filled 2022-11-09: qty 90, 30d supply, fill #0
  Filled 2022-12-14 – 2022-12-15 (×2): qty 90, 30d supply, fill #1
  Filled 2023-01-24: qty 90, 30d supply, fill #2

## 2022-11-09 MED ORDER — HYDROCODONE-ACETAMINOPHEN 10-325 MG PO TABS
1.0000 | ORAL_TABLET | Freq: Three times a day (TID) | ORAL | 0 refills | Status: DC | PRN
  Filled 2022-11-09: qty 75, 25d supply, fill #1
  Filled 2022-11-09: qty 15, 5d supply, fill #0
  Filled 2022-11-14: qty 75, 25d supply, fill #1

## 2022-11-09 NOTE — Telephone Encounter (Addendum)
Spoke with patient. Patient is at [facility] for his appointment.

## 2022-11-09 NOTE — Telephone Encounter (Signed)
Copied from CRM [id number]. Topic: General - Other >> Nov 09, 2022 10:46 AM Turkey B wrote: Reason for CRM: pt called in to get address for Neurosurgeon for appt he has on Aug 7 at 11am. Reference NT note on 08/05.

## 2022-11-14 ENCOUNTER — Other Ambulatory Visit (HOSPITAL_COMMUNITY): Payer: Self-pay

## 2022-11-15 ENCOUNTER — Other Ambulatory Visit: Payer: Self-pay | Admitting: Pharmacist

## 2022-11-15 ENCOUNTER — Telehealth: Payer: Self-pay | Admitting: Family Medicine

## 2022-11-15 ENCOUNTER — Other Ambulatory Visit (HOSPITAL_COMMUNITY): Payer: Self-pay

## 2022-11-15 DIAGNOSIS — Z794 Long term (current) use of insulin: Secondary | ICD-10-CM

## 2022-11-15 MED ORDER — ACCU-CHEK GUIDE VI STRP
ORAL_STRIP | 6 refills | Status: DC
Start: 2022-11-15 — End: 2023-06-30
  Filled 2022-11-15: qty 100, 33d supply, fill #0
  Filled 2022-12-15 – 2023-01-24 (×2): qty 100, 33d supply, fill #1

## 2022-11-15 MED ORDER — ACCU-CHEK GUIDE W/DEVICE KIT
PACK | 0 refills | Status: DC
Start: 2022-11-15 — End: 2023-06-30
  Filled 2022-11-15: qty 1, 30d supply, fill #0

## 2022-11-15 MED ORDER — ACCU-CHEK SOFTCLIX LANCETS MISC
6 refills | Status: DC
Start: 2022-11-15 — End: 2023-06-30
  Filled 2022-11-15: qty 100, 33d supply, fill #0
  Filled 2022-12-15 – 2023-01-24 (×2): qty 100, 33d supply, fill #1

## 2022-11-15 MED ORDER — PEN NEEDLES 32G X 4 MM MISC
6 refills | Status: DC
Start: 2022-11-15 — End: 2023-06-30
  Filled 2022-11-15: qty 100, 90d supply, fill #0
  Filled 2023-03-30: qty 100, 90d supply, fill #1

## 2022-11-15 NOTE — Telephone Encounter (Signed)
Medication Refill - Medication: Lantus Solostar Pen Tips #6, Lancets (for square bottomed machine)  Has the patient contacted their pharmacy? Yes.    (Agent: If yes, when and what did the pharmacy advise?) Contact PCP   Preferred Pharmacy (with phone number or street name): [place] [facility]   Has the patient been seen for an appointment in the last year OR does the patient have an upcoming appointment? Yes.    Agent: Please be advised that RX refills may take up to 3 business days. We ask that you follow-up with your pharmacy.

## 2022-11-15 NOTE — Telephone Encounter (Signed)
Rxn sent for pen needles and testing supplies.

## 2022-11-18 ENCOUNTER — Other Ambulatory Visit: Payer: Self-pay

## 2022-11-18 ENCOUNTER — Other Ambulatory Visit (HOSPITAL_COMMUNITY): Payer: Self-pay

## 2022-11-23 ENCOUNTER — Other Ambulatory Visit (HOSPITAL_COMMUNITY): Payer: Self-pay

## 2022-11-23 ENCOUNTER — Other Ambulatory Visit: Payer: Self-pay | Admitting: Family Medicine

## 2022-11-24 ENCOUNTER — Other Ambulatory Visit (HOSPITAL_COMMUNITY): Payer: Self-pay

## 2022-11-25 ENCOUNTER — Other Ambulatory Visit (HOSPITAL_COMMUNITY): Payer: Self-pay

## 2022-11-25 MED ORDER — HYDROCODONE-ACETAMINOPHEN 10-325 MG PO TABS
1.0000 | ORAL_TABLET | ORAL | 0 refills | Status: DC | PRN
Start: 2022-11-25 — End: 2023-02-05
  Filled 2022-11-25: qty 30, 5d supply, fill #0
  Filled 2022-11-29: qty 60, 10d supply, fill #1
  Filled 2022-11-29: qty 30, 5d supply, fill #2
  Filled 2022-11-29: qty 30, 5d supply, fill #1
  Filled 2022-11-30 – 2022-12-01 (×2): qty 30, 5d supply, fill #2

## 2022-11-29 ENCOUNTER — Other Ambulatory Visit (HOSPITAL_COMMUNITY): Payer: Self-pay

## 2022-11-29 ENCOUNTER — Other Ambulatory Visit: Payer: Self-pay | Admitting: Family Medicine

## 2022-11-30 ENCOUNTER — Other Ambulatory Visit (HOSPITAL_COMMUNITY): Payer: Self-pay

## 2022-12-01 ENCOUNTER — Other Ambulatory Visit (HOSPITAL_COMMUNITY): Payer: Self-pay

## 2022-12-01 MED ORDER — HYDROCODONE-ACETAMINOPHEN 10-325 MG PO TABS
1.0000 | ORAL_TABLET | ORAL | 0 refills | Status: DC | PRN
Start: 2022-11-30 — End: 2023-02-05
  Filled 2022-12-04: qty 90, 15d supply, fill #0
  Filled 2022-12-06: qty 30, 5d supply, fill #0
  Filled 2022-12-12: qty 60, 10d supply, fill #1
  Filled 2022-12-30: qty 30, 5d supply, fill #1
  Filled 2023-01-04: qty 60, 10d supply, fill #1

## 2022-12-06 ENCOUNTER — Other Ambulatory Visit (HOSPITAL_COMMUNITY): Payer: Self-pay

## 2022-12-06 ENCOUNTER — Ambulatory Visit: Payer: Medicaid Other | Admitting: Internal Medicine

## 2022-12-06 ENCOUNTER — Other Ambulatory Visit: Payer: Self-pay

## 2022-12-06 NOTE — Progress Notes (Deleted)
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 08/08/2022   MRSA bacteremia 08/08/2022   AKI (acute kidney injury), ruled out 07/27/2022   Discitis thoracic region 07/27/2022   Bacteremia due to methicillin resistant Staphylococcus aureus 07/27/2022   Severe protein-calorie malnutrition (HCC) 07/26/2022   PVD (peripheral vascular disease) (HCC) 07/26/2022   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   Bacteremia 07/15/2022   Acute respiratory failure with hypoxia (HCC) 07/12/2022   Atelectasis 07/12/2022   Sepsis due to pneumonia (HCC) 07/12/2022   Gangrene of toe of right foot (HCC)    Sepsis (HCC) 08/29/2021   SIRS (systemic inflammatory response syndrome) (HCC) 08/29/2021   Hyponatremia 08/29/2021   Hypomagnesemia 08/29/2021   Pure hypercholesterolemia 06/17/2021   Pain of left hand 09/06/2019   Cellulitis 08/26/2019   Persistent atrial fibrillation (HCC) 08/26/2019   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) 10/18/2018   Transaminitis 10/17/2018   Hyperbilirubinemia 10/17/2018   Leukocytosis 10/17/2018   Sinusitis 10/17/2018   Chronic anticoagulation 09/30/2017   Smoker 09/30/2017   Cardiomyopathy (HCC) 09/30/2017   Sleep apnea suspected 09/30/2017   Essential hypertension 09/26/2017   Uncontrolled type 2 diabetes with hyperglycemia and hypoglycemia, insulin long term use 09/26/2017    Atrial fibrillation with RVR (HCC) 09/26/2017    Patient's Medications  New Prescriptions   No medications on file  Previous Medications   ACCU-CHEK SOFTCLIX LANCETS LANCETS    Use to check blood sugar 3 times daily.   APIXABAN (ELIQUIS) 5 MG TABS TABLET    Take 1 tablet (5 mg total) by mouth 2 (two) times daily.   ATORVASTATIN (LIPITOR) 80 MG TABLET    Take 1 tablet (80 mg total) by mouth daily.   BISACODYL (DULCOLAX) 10 MG SUPPOSITORY    Place 1 suppository (10 mg total) rectally daily as needed for moderate constipation.   BLOOD GLUCOSE MONITORING SUPPL (ACCU-CHEK GUIDE) W/DEVICE KIT    Use to check blood sugar 3 times daily. E11.9   BUSPIRONE (BUSPAR) 15 MG TABLET    Take 1 tablet (15 mg total) by mouth 2 (two) times daily.   CARVEDILOL (COREG) 3.125 MG TABLET    Take 1 tablet (3.125 mg total) by mouth 2 (two) times daily.   DIGOXIN (LANOXIN) 0.125 MG TABLET    Take 1 tablet (125 mcg total) by mouth once daily.   DOXYCYCLINE (VIBRA-TABS) 100 MG TABLET    Take 1 tablet (100 mg total) by mouth every 12 (twelve) hours.   DULOXETINE (CYMBALTA) 20 MG CAPSULE    Take 1 capsule (20 mg total) by mouth daily.   FAMOTIDINE (PEPCID) 20 MG TABLET    Take 1 tablet (20 mg total) by mouth 2 times daily.   FEEDING SUPPLEMENT (ENSURE ENLIVE / ENSURE PLUS) LIQD    Take 237 mLs by mouth 2 (two) times daily between meals.   FERROUS SULFATE 325 (65 FE) MG TABLET    Take 1 tablet (325  mg total) by mouth every other day.   FINASTERIDE (PROSCAR) 5 MG TABLET    Take 1 tablet (5 mg total) by mouth daily.   FLUTICASONE (FLONASE) 50 MCG/ACT NASAL SPRAY    Place 2 sprays into both nostrils daily.   FUROSEMIDE (LASIX) 40 MG TABLET    Take 1/2 tablet (20 mg total) by mouth daily.   GLUCOSE BLOOD (ACCU-CHEK GUIDE) TEST STRIP    Use to check blood sugar 3 times daily.   HYDROCODONE-ACETAMINOPHEN (NORCO) 10-325 MG TABLET    Take 1 tablet by mouth every 6 (six) hours as needed for severe pain.    HYDROCODONE-ACETAMINOPHEN (NORCO) 10-325 MG TABLET    Take 1 tablet by mouth every 8 (eight) hours as needed for pain   HYDROCODONE-ACETAMINOPHEN (NORCO) 10-325 MG TABLET    Take 1 tablet by mouth every 4 (four) hours as needed for pain   HYDROCODONE-ACETAMINOPHEN (NORCO) 10-325 MG TABLET    Take 1 tablet by mouth every 4 (four) hours as needed.   INSULIN GLARGINE (LANTUS SOLOSTAR) 100 UNIT/ML SOLOSTAR PEN    Inject 20 Units into the skin daily.   INSULIN PEN NEEDLE (PEN NEEDLES) 32G X 4 MM MISC    Use to inject insulin once daily.   LIDOCAINE 4 %    Place 1 patch onto the skin daily.   LIVER OIL-ZINC OXIDE (DESITIN) 40 % OINTMENT    Apply topically 2 (two) times daily.   MAGNESIUM OXIDE (MAG-OX) 400 (240 MG) MG TABLET    Take 1 tablet (400 mg total) by mouth daily.   METHOCARBAMOL (ROBAXIN) 750 MG TABLET    Take 1 tablet (750 mg total) by mouth 3 (three) times daily as needed for muscle spasms   NALOXONE (NARCAN) NASAL SPRAY 4 MG/0.1 ML    Opiates overdose   POLYETHYLENE GLYCOL POWDER (GLYCOLAX/MIRALAX) 17 GM/SCOOP POWDER    Dissolve 1 capful (17 g) in water and drink 2 (two) times daily.   PREGABALIN (LYRICA) 75 MG CAPSULE    Take 1 capsule (75 mg total) by mouth at bedtime.   SACUBITRIL-VALSARTAN (ENTRESTO) 49-51 MG    Take 1 tablet by mouth 2 (two) times daily.   SENNA-DOCUSATE (SENOKOT-S) 8.6-50 MG TABLET    Take 1 tablet by mouth 2 (two) times daily.   TAMSULOSIN (FLOMAX) 0.4 MG CAPS CAPSULE    Take 1 capsule (0.4 mg total) by mouth daily after supper.  Modified Medications   No medications on file  Discontinued Medications   No medications on file    Subjective: ***   Review of Systems: ROS  Past Medical History:  Diagnosis Date   Cardiomyopathy (HCC)    a. EF 45% in 2019.   CHF (congestive heart failure) (HCC)    Chronic anticoagulation 09/30/2017   Diabetes mellitus type 2 in nonobese [facility])    Discitis of thoracic region 09/25/2022   Does not have health insurance     Essential hypertension 09/26/2017   Financial difficulties    H/O noncompliance with medical treatment, presenting hazards to health    Hardware complicating wound infection (HCC) 09/25/2022   Non-insulin treated type 2 diabetes mellitus (HCC) 09/26/2017   Persistent atrial fibrillation (HCC)    Sleep apnea suspected 09/30/2017    Social History   Tobacco Use   Smoking status: Former    Current packs/day: 0.00    Types: Cigarettes    Start date: 03/19/2003    Quit date: 03/18/2018    Years since quitting: 4.7  Smokeless tobacco: Never   Tobacco comments:    09/26/2017 "2-3 cigarettes/month now"  Vaping Use   Vaping status: Never Used  Substance Use Topics   Alcohol use: Not Currently    Alcohol/week: 3.0 standard drinks of alcohol    Types: 3 Cans of beer per week    Comment: States he quit drinking 8-9 months ago   Drug use: Never    Family History  Problem Relation Age of Onset   Hypertension Mother     No Known Allergies  Health Maintenance  Topic Date Due   COVID-19 Vaccine (1) Never done   FOOT EXAM  Never done   OPHTHALMOLOGY EXAM  Never done   Hepatitis C Screening  Never done   Zoster Vaccines- Shingrix (1 of 2) Never done   Diabetic kidney evaluation - Urine ACR  01/08/2022   INFLUENZA VACCINE  Never done   HEMOGLOBIN A1C  04/10/2023   Diabetic kidney evaluation - eGFR measurement  10/28/2023   DTaP/Tdap/Td (2 - Tdap) 08/25/2029   HIV Screening  Completed   HPV VACCINES  Aged Out   Colonoscopy  Discontinued    Objective:  There were no vitals filed for this visit. There is no height or weight on file to calculate BMI.  Physical Exam  Lab Results Lab Results  Component Value Date   WBC 7.0 10/27/2022   HGB 12.8 (L) 10/27/2022   HCT 37.8 (L) 10/27/2022   MCV 87.3 10/27/2022   PLT 187 10/27/2022    Lab Results  Component Value Date   CREATININE 1.36 (H) 10/28/2022   BUN 25 (H) 10/28/2022   NA 134 (L) 10/28/2022   K 4.6 10/28/2022   CL  99 10/28/2022   CO2 26 10/28/2022    Lab Results  Component Value Date   ALT 14 09/29/2022   AST 16 09/29/2022   ALKPHOS 96 09/29/2022   BILITOT 0.7 09/29/2022    Lab Results  Component Value Date   CHOL 213 (H) 12/14/2021   HDL 36 (L) 12/14/2021   LDLCALC 142 (H) 12/14/2021   TRIG 191 (H) 12/14/2021   CHOLHDL 5.9 (H) 12/14/2021   No results found for: "LABRPR", "RPRTITER" No results found for: "HIV1RNAQUANT", "HIV1RNAVL", "CD4TABS"   Problem List Items Addressed This Visit   None  Assessment/Plan    Danelle Earthly, MD [facility] 12/06/2022, 8:59 AM

## 2022-12-12 ENCOUNTER — Ambulatory Visit (INDEPENDENT_AMBULATORY_CARE_PROVIDER_SITE_OTHER): Payer: Medicaid Other | Admitting: Internal Medicine

## 2022-12-12 ENCOUNTER — Other Ambulatory Visit: Payer: Self-pay

## 2022-12-12 ENCOUNTER — Encounter: Payer: Self-pay | Admitting: Internal Medicine

## 2022-12-12 ENCOUNTER — Other Ambulatory Visit (HOSPITAL_COMMUNITY): Payer: Self-pay

## 2022-12-12 VITALS — BP 124/78 | HR 89 | Resp 16 | Ht 72.0 in | Wt 157.0 lb

## 2022-12-12 DIAGNOSIS — M868X9 Other osteomyelitis, unspecified sites: Secondary | ICD-10-CM

## 2022-12-12 DIAGNOSIS — M86171 Other acute osteomyelitis, right ankle and foot: Secondary | ICD-10-CM

## 2022-12-12 DIAGNOSIS — Z8614 Personal history of Methicillin resistant Staphylococcus aureus infection: Secondary | ICD-10-CM

## 2022-12-12 MED ORDER — HYDROCODONE-ACETAMINOPHEN 10-325 MG PO TABS
1.0000 | ORAL_TABLET | ORAL | 0 refills | Status: DC | PRN
Start: 2022-12-12 — End: 2023-02-05
  Filled 2022-12-12: qty 30, 5d supply, fill #0
  Filled 2022-12-14: qty 30, 5d supply, fill #1
  Filled 2022-12-15: qty 60, 10d supply, fill #1
  Filled 2022-12-15: qty 30, 5d supply, fill #1
  Filled 2022-12-15 (×2): qty 60, 10d supply, fill #1

## 2022-12-12 NOTE — Progress Notes (Signed)
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 08/08/2022   MRSA bacteremia 08/08/2022   AKI (acute kidney injury), ruled out 07/27/2022   Discitis thoracic region 07/27/2022   Bacteremia due to methicillin resistant Staphylococcus aureus 07/27/2022   Severe protein-calorie malnutrition (HCC) 07/26/2022   PVD (peripheral vascular disease) (HCC) 07/26/2022   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   Bacteremia 07/15/2022   Acute respiratory failure with hypoxia (HCC) 07/12/2022   Atelectasis 07/12/2022   Sepsis due to pneumonia (HCC) 07/12/2022   Gangrene of toe of right foot (HCC)    Sepsis (HCC) 08/29/2021   SIRS (systemic inflammatory response syndrome) (HCC) 08/29/2021   Hyponatremia 08/29/2021   Hypomagnesemia 08/29/2021   Pure hypercholesterolemia 06/17/2021   Pain of left hand 09/06/2019   Cellulitis 08/26/2019   Persistent atrial fibrillation (HCC) 08/26/2019   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) 10/18/2018   Transaminitis 10/17/2018   Hyperbilirubinemia 10/17/2018   Leukocytosis 10/17/2018   Sinusitis 10/17/2018   Chronic anticoagulation 09/30/2017   Smoker 09/30/2017   Cardiomyopathy (HCC) 09/30/2017   Sleep apnea suspected 09/30/2017   Essential hypertension 09/26/2017   Uncontrolled type 2 diabetes with hyperglycemia and hypoglycemia, insulin long term use 09/26/2017    Atrial fibrillation with RVR (HCC) 09/26/2017    Patient's Medications  New Prescriptions   No medications on file  Previous Medications   ACCU-CHEK SOFTCLIX LANCETS LANCETS    Use to check blood sugar 3 times daily.   APIXABAN (ELIQUIS) 5 MG TABS TABLET    Take 1 tablet (5 mg total) by mouth 2 (two) times daily.   ATORVASTATIN (LIPITOR) 80 MG TABLET    Take 1 tablet (80 mg total) by mouth daily.   BISACODYL (DULCOLAX) 10 MG SUPPOSITORY    Place 1 suppository (10 mg total) rectally daily as needed for moderate constipation.   BLOOD GLUCOSE MONITORING SUPPL (ACCU-CHEK GUIDE) W/DEVICE KIT    Use to check blood sugar 3 times daily. E11.9   BUSPIRONE (BUSPAR) 15 MG TABLET    Take 1 tablet (15 mg total) by mouth 2 (two) times daily.   CARVEDILOL (COREG) 3.125 MG TABLET    Take 1 tablet (3.125 mg total) by mouth 2 (two) times daily.   DIGOXIN (LANOXIN) 0.125 MG TABLET    Take 1 tablet (125 mcg total) by mouth once daily.   DOXYCYCLINE (VIBRA-TABS) 100 MG TABLET    Take 1 tablet (100 mg total) by mouth every 12 (twelve) hours.   DULOXETINE (CYMBALTA) 20 MG CAPSULE    Take 1 capsule (20 mg total) by mouth daily.   FAMOTIDINE (PEPCID) 20 MG TABLET    Take 1 tablet (20 mg total) by mouth 2 times daily.   FEEDING SUPPLEMENT (ENSURE ENLIVE / ENSURE PLUS) LIQD    Take 237 mLs by mouth 2 (two) times daily between meals.   FERROUS SULFATE 325 (65 FE) MG TABLET    Take 1 tablet (325  mg total) by mouth every other day.   FINASTERIDE (PROSCAR) 5 MG TABLET    Take 1 tablet (5 mg total) by mouth daily.   FLUTICASONE (FLONASE) 50 MCG/ACT NASAL SPRAY    Place 2 sprays into both nostrils daily.   FUROSEMIDE (LASIX) 40 MG TABLET    Take 1/2 tablet (20 mg total) by mouth daily.   GLUCOSE BLOOD (ACCU-CHEK GUIDE) TEST STRIP    Use to check blood sugar 3 times daily.   HYDROCODONE-ACETAMINOPHEN (NORCO) 10-325 MG TABLET    Take 1 tablet by mouth every 6 (six) hours as needed for severe pain.    HYDROCODONE-ACETAMINOPHEN (NORCO) 10-325 MG TABLET    Take 1 tablet by mouth every 8 (eight) hours as needed for pain   HYDROCODONE-ACETAMINOPHEN (NORCO) 10-325 MG TABLET    Take 1 tablet by mouth every 4 (four) hours as needed for pain   HYDROCODONE-ACETAMINOPHEN (NORCO) 10-325 MG TABLET    Take 1 tablet by mouth every 4 (four) hours as needed for pain   HYDROCODONE-ACETAMINOPHEN (NORCO) 10-325 MG TABLET    Take 1 tablet by mouth every 4 (four) hours as needed for pain.   INSULIN GLARGINE (LANTUS SOLOSTAR) 100 UNIT/ML SOLOSTAR PEN    Inject 20 Units into the skin daily.   INSULIN PEN NEEDLE (PEN NEEDLES) 32G X 4 MM MISC    Use to inject insulin once daily.   LIDOCAINE 4 %    Place 1 patch onto the skin daily.   LIVER OIL-ZINC OXIDE (DESITIN) 40 % OINTMENT    Apply topically 2 (two) times daily.   MAGNESIUM OXIDE (MAG-OX) 400 (240 MG) MG TABLET    Take 1 tablet (400 mg total) by mouth daily.   METHOCARBAMOL (ROBAXIN) 750 MG TABLET    Take 1 tablet (750 mg total) by mouth 3 (three) times daily as needed for muscle spasms   NALOXONE (NARCAN) NASAL SPRAY 4 MG/0.1 ML    Opiates overdose   POLYETHYLENE GLYCOL POWDER (GLYCOLAX/MIRALAX) 17 GM/SCOOP POWDER    Dissolve 1 capful (17 g) in water and drink 2 (two) times daily.   PREGABALIN (LYRICA) 75 MG CAPSULE    Take 1 capsule (75 mg total) by mouth at bedtime.   SACUBITRIL-VALSARTAN (ENTRESTO) 49-51 MG    Take 1 tablet by mouth 2 (two) times daily.   SENNA-DOCUSATE (SENOKOT-S) 8.6-50 MG TABLET    Take 1 tablet by mouth 2 (two) times daily.   TAMSULOSIN (FLOMAX) 0.4 MG CAPS CAPSULE    Take 1 capsule (0.4 mg total) by mouth daily after supper.  Modified Medications   No medications on file  Discontinued Medications   No medications on file    Subjective: 72 YM male with past medical history as below presents for hospital follow-up.  He was admitted to [facility] 5/29 - 6/5 with right fourth toe osteomyelitis.  He had a recent spider bite and fifth digit  had worsened with more pain and less movement.  He was started on vancomycin, metronidazole and cefepime found to have MRSA bacteremia during hospitalization.  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  Underwent recanalization of occluded posttibial artery and balloon angioplasty on 6/1.  He underwent amputation of fourth ray foot on 6/3 with Dr. [name], no cultures obtained.  Discharged ceftriaxone, daptomycin and metronidazole x6 weeks EOT 10/15/2021.  09/21/2021: Original bandage on from discharge. Reports more pain at amputation side. Denies fever and chills.   Interim seen by ortho  today, noted dehiscence of incision, with scant drainage.  7/21: Pt reports he has yellowish drainage from wound. PICC line out. Foot pain continues. Denies fevers and chills.   Interim: Pt was started on doxy at last visit due concern of worsening infection. MRI ordered which showed  marrow edema of residual fourth toe  11/12/21: Drainage has improved. Denies fever and and chills. Pt has returned to work. Interim: hospitalized for T spine OM 9/9: No new complaints.   Denies fevers, chills  Review of Systems: Review of Systems  All other systems reviewed and are negative.   Past Medical History:  Diagnosis Date   Cardiomyopathy (HCC)    a. EF 45% in 2019.   CHF (congestive heart failure) (HCC)    Chronic anticoagulation 09/30/2017   Diabetes mellitus type 2 in nonobese [facility])    Discitis of thoracic region 09/25/2022   Does not have health insurance    Essential hypertension 09/26/2017   Financial difficulties    H/O noncompliance with medical treatment, presenting hazards to health    Hardware complicating wound infection (HCC) 09/25/2022   Non-insulin treated type 2 diabetes mellitus (HCC) 09/26/2017   Persistent atrial fibrillation (HCC)    Sleep apnea suspected 09/30/2017    Social History   Tobacco Use   Smoking status: Former     Current packs/day: 0.00    Types: Cigarettes    Start date: 03/19/2003    Quit date: 03/18/2018    Years since quitting: 4.7   Smokeless tobacco: Never   Tobacco comments:    09/26/2017 "2-3 cigarettes/month now"  Vaping Use   Vaping status: Never Used  Substance Use Topics   Alcohol use: Not Currently    Alcohol/week: 3.0 standard drinks of alcohol    Types: 3 Cans of beer per week    Comment: States he quit drinking 8-9 months ago   Drug use: Never    Family History  Problem Relation Age of Onset   Hypertension Mother     No Known Allergies  Health Maintenance  Topic Date Due   COVID-19 Vaccine (1) Never done   FOOT EXAM  Never done   OPHTHALMOLOGY EXAM  Never done   Hepatitis C Screening  Never done   Zoster Vaccines- Shingrix (1 of 2) Never done   Diabetic kidney evaluation - Urine ACR  01/08/2022   INFLUENZA VACCINE  Never done   HEMOGLOBIN A1C  04/10/2023   Diabetic kidney evaluation - eGFR measurement  10/28/2023   DTaP/Tdap/Td (2 - Tdap) 08/25/2029   HIV Screening  Completed   HPV VACCINES  Aged Out   Colonoscopy  Discontinued    Objective:  There were no vitals filed for this visit. There is no height or weight on file to calculate BMI.  Physical Exam Constitutional:      General: He is not in acute distress.    Appearance: He is normal weight. He is not toxic-appearing.  HENT:     Head: Normocephalic and atraumatic.     Right Ear: External ear normal.     Left Ear: External ear normal.     Nose: No congestion or rhinorrhea.     Mouth/Throat:     Mouth: Mucous membranes are moist.     Pharynx: Oropharynx is clear.  Eyes:     Extraocular Movements: Extraocular movements intact.     Conjunctiva/sclera: Conjunctivae normal.     Pupils: Pupils are equal, round, and reactive to light.  Cardiovascular:  Rate and Rhythm: Normal rate and regular rhythm.     Heart sounds: No murmur heard.    No friction rub. No gallop.  Pulmonary:     Effort:  Pulmonary effort is normal.     Breath sounds: Normal breath sounds.  Abdominal:     General: Abdomen is flat. Bowel sounds are normal.     Palpations: Abdomen is soft.  Musculoskeletal:        General: No swelling. Normal range of motion.     Cervical back: Normal range of motion and neck supple.  Skin:    General: Skin is warm and dry.  Neurological:     General: No focal deficit present.     Mental Status: He is oriented to person, place, and time.  Psychiatric:        Mood and Affect: Mood normal.        Lab Results Lab Results  Component Value Date   WBC 7.0 10/27/2022   HGB 12.8 (L) 10/27/2022   HCT 37.8 (L) 10/27/2022   MCV 87.3 10/27/2022   PLT 187 10/27/2022    Lab Results  Component Value Date   CREATININE 1.36 (H) 10/28/2022   BUN 25 (H) 10/28/2022   NA 134 (L) 10/28/2022   K 4.6 10/28/2022   CL 99 10/28/2022   CO2 26 10/28/2022    Lab Results  Component Value Date   ALT 14 09/29/2022   AST 16 09/29/2022   ALKPHOS 96 09/29/2022   BILITOT 0.7 09/29/2022    Lab Results  Component Value Date   CHOL 213 (H) 12/14/2021   HDL 36 (L) 12/14/2021   LDLCALC 142 (H) 12/14/2021   TRIG 191 (H) 12/14/2021   CHOLHDL 5.9 (H) 12/14/2021   No results found for: "LABRPR", "RPRTITER" No results found for: "HIV1RNAQUANT", "HIV1RNAVL", "CD4TABS"   Problem List Items Addressed This Visit   None  Assessment/Plan #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  -SP extensive I&D of T spine Cx+ MRSA on 5/28. given hardware needs suppression. Treated with dapto till 6/5->doxy indefinitely -Follows with [facility]Y for back pain  Plan: -On doxy  suppresion -Labs today - Foot and back wound without signs of infection   #DM-poorly controlled(A1c 11.2 on 08/30/21) -Counseled on glycemic control  Danelle Earthly, MD [facility] 12/12/2022, 4:09 PM   I have personally spent 65 minutes involved in face-to-face and non-face-to-face activities for this patient on the day of the visit. Professional time spent includes the following activities: Preparing to see the patient (review of tests), Obtaining and/or reviewing separately obtained history (admission/discharge record), Performing a medically appropriate examination and/or evaluation , Ordering medications/tests/procedures, referring and communicating with other health care professionals, Documenting clinical information in the EMR, Independently interpreting results (not separately reported), Communicating results to the patient/family/caregiver, Counseling and educating the patient/family/caregiver and Care coordination (not separately reported).

## 2022-12-13 LAB — COMPLETE METABOLIC PANEL WITH GFR
AG Ratio: 1.5 (calc) (ref 1.0–2.5)
ALT: 11 U/L (ref 9–46)
AST: 13 U/L (ref 10–35)
Albumin: 4 g/dL (ref 3.6–5.1)
Alkaline phosphatase (APISO): 101 U/L (ref 35–144)
BUN: 22 mg/dL (ref 7–25)
CO2: 24 mmol/L (ref 20–32)
Calcium: 9.8 mg/dL (ref 8.6–10.3)
Chloride: 100 mmol/L (ref 98–110)
Creat: 1.26 mg/dL (ref 0.70–1.35)
Globulin: 2.7 g/dL (ref 1.9–3.7)
Glucose, Bld: 316 mg/dL — ABNORMAL HIGH (ref 65–99)
Potassium: 4.2 mmol/L (ref 3.5–5.3)
Sodium: 134 mmol/L — ABNORMAL LOW (ref 135–146)
Total Bilirubin: 1.4 mg/dL — ABNORMAL HIGH (ref 0.2–1.2)
Total Protein: 6.7 g/dL (ref 6.1–8.1)
eGFR: 65 mL/min/{1.73_m2} (ref >=60)

## 2022-12-13 LAB — CBC WITH DIFFERENTIAL/PLATELET
Absolute Monocytes: 825 {cells}/uL (ref 200–950)
Basophils Absolute: 73 {cells}/uL (ref 0–200)
Basophils Relative: 1.1 %
Eosinophils Absolute: 152 {cells}/uL (ref 15–500)
Eosinophils Relative: 2.3 %
HCT: 43.6 % (ref 38.5–50.0)
Hemoglobin: 14.7 g/dL (ref 13.2–17.1)
Lymphs Abs: 739 {cells}/uL — ABNORMAL LOW (ref 850–3900)
MCH: 30.4 pg (ref 27.0–33.0)
MCHC: 33.7 g/dL (ref 32.0–36.0)
MCV: 90.1 fL (ref 80.0–100.0)
MPV: 9.6 fL (ref 7.5–12.5)
Monocytes Relative: 12.5 %
Neutro Abs: 4811 {cells}/uL (ref 1500–7800)
Neutrophils Relative %: 72.9 %
Platelets: 222 10*3/uL (ref 140–400)
RBC: 4.84 10*6/uL (ref 4.20–5.80)
RDW: 14.4 % (ref 11.0–15.0)
Total Lymphocyte: 11.2 %
WBC: 6.6 10*3/uL (ref 3.8–10.8)

## 2022-12-13 LAB — SEDIMENTATION RATE: Sed Rate: 11 mm/h (ref 0–20)

## 2022-12-13 LAB — C-REACTIVE PROTEIN: CRP: 7.1 mg/L (ref <8.0)

## 2022-12-14 ENCOUNTER — Ambulatory Visit: Payer: Medicaid Other | Attending: Family

## 2022-12-14 ENCOUNTER — Other Ambulatory Visit (HOSPITAL_COMMUNITY): Payer: Self-pay

## 2022-12-14 ENCOUNTER — Other Ambulatory Visit: Payer: Self-pay

## 2022-12-14 DIAGNOSIS — R2681 Unsteadiness on feet: Secondary | ICD-10-CM | POA: Diagnosis present

## 2022-12-14 DIAGNOSIS — Z09 Encounter for follow-up examination after completed treatment for conditions other than malignant neoplasm: Secondary | ICD-10-CM | POA: Insufficient documentation

## 2022-12-14 DIAGNOSIS — M6281 Muscle weakness (generalized): Secondary | ICD-10-CM | POA: Diagnosis present

## 2022-12-14 DIAGNOSIS — R262 Difficulty in walking, not elsewhere classified: Secondary | ICD-10-CM | POA: Insufficient documentation

## 2022-12-14 DIAGNOSIS — R2689 Other abnormalities of gait and mobility: Secondary | ICD-10-CM | POA: Insufficient documentation

## 2022-12-14 NOTE — Therapy (Signed)
OUTPATIENT PHYSICAL THERAPY NEURO EVALUATION   Patient Name: David [name] MRN: [id number] DOB:02/25/1961, 62 y.o., male Today's Date: 12/14/2022   PCP: [name], MD REFERRING PROVIDER: Eleonore Chiquito, FNP  END OF SESSION:  PT End of Session - 12/14/22 1706     Visit Number 1    Number of Visits 12    Date for PT Re-Evaluation 01/25/23    Authorization Type [employer]    Authorization - Visit Number 0    Authorization - Number of Visits 27    PT Start Time 1615    PT Stop Time 1700    PT Time Calculation (min) 45 min             Past Medical History:  Diagnosis Date   Cardiomyopathy (HCC)    a. EF 45% in 2019.   CHF (congestive heart failure) (HCC)    Chronic anticoagulation 09/30/2017   Diabetes mellitus type 2 in nonobese [facility])    Discitis of thoracic region 09/25/2022   Does not have health insurance    Essential hypertension 09/26/2017   Financial difficulties    H/O noncompliance with medical treatment, presenting hazards to health    Hardware complicating wound infection (HCC) 09/25/2022   Non-insulin treated type 2 diabetes mellitus (HCC) 09/26/2017   Persistent atrial fibrillation (HCC)    Sleep apnea suspected 09/30/2017   Past Surgical History:  Procedure Laterality Date   ABDOMINAL AORTOGRAM W/LOWER EXTREMITY N/A 09/02/2021   Procedure: ABDOMINAL AORTOGRAM W/LOWER EXTREMITY;  Surgeon: Nada Libman, MD;  Location: [facility];  Service: Cardiovascular;  Laterality: N/A;   ABDOMINAL AORTOGRAM W/LOWER EXTREMITY N/A 12/28/2021   Procedure: ABDOMINAL AORTOGRAM W/LOWER EXTREMITY;  Surgeon: Nada Libman, MD;  Location: [facility];  Service: Cardiovascular;  Laterality: N/A;   ABDOMINAL AORTOGRAM W/LOWER EXTREMITY N/A 07/28/2022   Procedure: ABDOMINAL AORTOGRAM W/LOWER EXTREMITY;  Surgeon: Cephus Shelling, MD;  Location: [facility];  Service: Cardiovascular;  Laterality: N/A;   AMPUTATION Right 09/04/2021    Procedure: 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;  Surgeon: Nadara Mustard, MD;  Location: [facility] OR;  Service: Orthopedics;  Laterality: Right;   AMPUTATION Right 08/02/2022   Procedure: AMPUTATION RAY 5TH METATARSAL OF RIGHT FOOT;  Surgeon: [name], MD;  Location: [facility] OR;  Service: Orthopedics;  Laterality: Right;   APPENDECTOMY  1971   BONE BIOPSY Right 08/02/2022   Procedure: BONE BIOPSY OF 5TH METATARSAL RIGHT FOOT;  Surgeon: [name], MD;  Location: [facility] OR;  Service: Orthopedics;  Laterality: Right;   CARDIOVERSION N/A 09/28/2017   Procedure: CARDIOVERSION;  Surgeon: Thurmon Fair, MD;  Location: [facility];  Service: Cardiovascular;  Laterality: N/A;   IRRIGATION AND DEBRIDEMENT FOOT  08/02/2022   Procedure: IRRIGATION AND DEBRIDEMENT RIGHT 5TH METATARSAL FOOT;  Surgeon: [name], MD;  Location: [facility] OR;  Service: Orthopedics;;   LAMINECTOMY WITH POSTERIOR LATERAL ARTHRODESIS LEVEL 4 N/A 08/30/2022   Procedure: THORACIC FIVE-THORACIC ELEVEN FUSION, THORACIC EIGHT TRANSPEDICULAR DECOMPRESSION AND PARTIAL CORPECTOMY with O-Arm;  Surgeon: Bedelia Person, MD;  Location: [facility] OR;  Service: Neurosurgery;  Laterality: N/A;   PERIPHERAL VASCULAR BALLOON ANGIOPLASTY  09/02/2021   Procedure: PERIPHERAL VASCULAR BALLOON ANGIOPLASTY;  Surgeon: Nada Libman, MD;  [facility]: [facility];  Service: Cardiovascular;;   PERIPHERAL VASCULAR BALLOON ANGIOPLASTY  12/28/2021   Procedure: PERIPHERAL VASCULAR BALLOON ANGIOPLASTY;  Surgeon: Nada Libman, MD;  [facility]: [facility];  Service: Cardiovascular;;  Posterior Tibial PTA only  TEE WITHOUT CARDIOVERSION N/A 09/28/2017   Procedure: TRANSESOPHAGEAL ECHOCARDIOGRAM (TEE);  Surgeon: Thurmon Fair, MD;  Location: [facility] ENDOSCOPY;  Service: Cardiovascular;  Laterality: N/A;   TEE WITHOUT CARDIOVERSION N/A 09/03/2021   Procedure: TRANSESOPHAGEAL ECHOCARDIOGRAM (TEE);  Surgeon: Chilton Si, MD;  Location: [facility] ENDOSCOPY;  Service:  Cardiovascular;  Laterality: N/A;   TEE WITHOUT CARDIOVERSION N/A 07/15/2022   Procedure: TRANSESOPHAGEAL ECHOCARDIOGRAM;  Surgeon: Thurmon Fair, MD;  [facility];  Service: Cardiovascular;  Laterality: N/A;   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 08/08/2022   MRSA bacteremia 08/08/2022   AKI (acute kidney injury), ruled out 07/27/2022   Discitis thoracic region 07/27/2022   Bacteremia due to methicillin resistant Staphylococcus aureus 07/27/2022   Severe protein-calorie malnutrition (HCC) 07/26/2022   PVD (peripheral vascular disease) (HCC) 07/26/2022   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   Bacteremia 07/15/2022   Acute respiratory failure with hypoxia (HCC) 07/12/2022   Atelectasis 07/12/2022   Sepsis due to pneumonia (HCC) 07/12/2022   Gangrene of toe of right foot (HCC)    Sepsis (HCC) 08/29/2021   SIRS (systemic inflammatory response syndrome) (HCC) 08/29/2021   Hyponatremia 08/29/2021   Hypomagnesemia 08/29/2021   Pure hypercholesterolemia 06/17/2021   Pain of left hand 09/06/2019   Cellulitis 08/26/2019   Persistent atrial fibrillation (HCC) 08/26/2019   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) 10/18/2018   Transaminitis 10/17/2018   Hyperbilirubinemia 10/17/2018   Leukocytosis 10/17/2018   Sinusitis 10/17/2018   Chronic anticoagulation 09/30/2017   Smoker  09/30/2017   Cardiomyopathy (HCC) 09/30/2017   Sleep apnea suspected 09/30/2017   Essential hypertension 09/26/2017   Uncontrolled type 2 diabetes with hyperglycemia and hypoglycemia, insulin long term use 09/26/2017   Atrial fibrillation with RVR (HCC) 09/26/2017    ONSET DATE: 08/20/22  REFERRING DIAG: M46.44ICD-10-CMDiscitis of thoracic region  THERAPY DIAG:  Muscle weakness (generalized)  Difficulty in walking, not elsewhere classified  Unsteadiness on feet  Other abnormalities of gait and mobility  Rationale for Evaluation and Treatment: Rehabilitation  SUBJECTIVE:  SUBJECTIVE STATEMENT: Had issue following right toe amputations and ended up with MRSA infection which infected lungs and subsequently thoracic spine resulting in surgical fixation to thoracic spine. Following surgery he had LLE paralysis and worked with PT in hospital and was D/C home. Did not have HHPT but had HEP from hospital and has been progressing at home since that time. Presents to clinic today with continued mobility deficits, impairments, limitations.  Pt accompanied by: significant other  PERTINENT HISTORY:  Per patient, he was admitted to the local hospital on Aug 20, 2022 and had back surgery thereafter. Patient was then readmitted after being discharged for infection with low BG. He was discharged on October 28, 2022 on oral antibiotics with changes to his insulin regimen. past medical history of PVD, type 2 diabetes with hyperglycemia/hypoglycemia, seizure disorders, hypertension, hyperlipidemia, A-fib (on Eliquis) and recent bacteremia.  He is s/p angioplasty.  Patient reports since discharge he has been doing physical therapy/exercises at home but continues to have severe back pain    PAIN:  Are you having pain? Yes:  NPRS scale: did not grade/10 Pain location: mid back and radiating Pain description: stabbing Aggravating factors: activity and with rest Relieving factors: pain medication  PRECAUTIONS: Back  RED FLAGS:    WEIGHT BEARING RESTRICTIONS: No  FALLS: Has patient fallen in last 6 months? No  LIVING ENVIRONMENT: Lives with: lives with their spouse Lives in: House/apartment, pt reports housing issues since out of work Stairs: No Has following equipment at home: Environmental consultant - 2 wheeled  PLOF: Independent, previously worked as Photographer  PATIENT GOALS: be able to return to normal, walking without AD  OBJECTIVE:   DIAGNOSTIC FINDINGS: IMPRESSION: 1. Significantly regressed bilateral pleural effusions and improved bilateral ventilation since last month. 2. No new cardiopulmonary abnormality  COGNITION: Overall cognitive status: Within functional limits for tasks assessed   SENSATION: Proprioception: Impaired  Decreased LLE > RLE COORDINATION: Minimal impairment LLE  EDEMA:  none  MUSCLE TONE: NT      POSTURE: No Significant postural limitations  LOWER EXTREMITY ROM:     Active  Right Eval Left Eval  Hip flexion    Hip extension    Hip abduction    Hip adduction    Hip internal rotation    Hip external rotation    Knee flexion 120 120  Knee extension 0 0  Ankle dorsiflexion 15 10  Ankle plantarflexion    Ankle inversion    Ankle eversion     (Blank rows = not tested)  LOWER EXTREMITY MMT:    MMT Right Eval Left Eval  Hip flexion 3+ 3+  Hip extension    Hip abduction 3+ 3+  Hip adduction 4 4  Hip internal rotation    Hip external rotation    Knee flexion 4+ 4  Knee extension 5 4-  Ankle dorsiflexion 4+ 3+  Ankle plantarflexion 3+ 3+  Ankle inversion    Ankle eversion    (Blank rows = not tested)  BED MOBILITY:  Modified indep due to back precautions  TRANSFERS: Assistive device utilized: Environmental consultant - 2 wheeled  Sit to stand: Modified  independence Stand to sit: Modified independence Chair to chair: Modified independence Floor:  NT    CURB:  Level of Assistance: SBA Assistive device utilized: Environmental consultant - 2 wheeled Curb Comments:   STAIRS: Level of Assistance: SBA Stair Negotiation Technique: Step to Pattern Alternating Pattern  with Bilateral Rails Number of Stairs: 8  Height of Stairs: 4-6"  Comments:  GAIT: Gait pattern: Left foot flat Distance walked:  Assistive device utilized: Walker - 2 wheeled Level of assistance: Modified independence Comments: ambulates with good stride, decreased speed. Without AD ambulates with past retracts  FUNCTIONAL TESTS:  5 times sit to stand: 25 sec w/ BUE Timed up and go (TUG): 19 sec  M-CTSIB  Condition 1: Firm Surface, EO 30 Sec, Moderate Sway  Condition 2: Firm Surface, EC 10 Sec,  left LOB  Sway  Condition 3: Foam Surface, EO  Sec,  Sway  Condition 4: Foam Surface, EC  Sec,  Sway       PATIENT EDUCATION: Education details: assessment details, rationale of PT intervention Person educated: Patient Education method: Explanation Education comprehension: verbalized understanding  HOME EXERCISE PROGRAM: TBD  GOALS: Goals reviewed with patient? Yes  SHORT TERM GOALS: Target date: 01/11/2023    Patient will be independent in HEP to improve functional outcomes Baseline: Goal status: INITIAL  2.  Patient will achieve 12 seconds for TUG test to manifest reduced risk for falls Baseline: 19 w/ RW Goal status: INITIAL  3.  Demo improved BLE strength and reduced risk for falls per time 15 sec 5xSTS test Baseline: 25 sec w/ BUE push-off Goal status: INITIAL  4. Demo modified independent stair ambulation with reciprocal pattern and single HR to improve community mobility  Baseline: SBA, BHR  Goal status: INITIAL  LONG TERM GOALS: Target date: 01/25/2023    Demo low risk for falls per score 50/56 Berg Balance Test Baseline: TBD Goal status: INITIAL  2.   Demo independent ambulation level surfaces w/out AD x 150 ft to improve independence and safety with household ambulation Baseline: dependent on RW Goal status: INITIAL  3.  Demo gross BLE strength to 4+/5 to improve activity tolerance and stability for unsupported standing/gait Baseline:  Goal status: INITIAL    ASSESSMENT:  CLINICAL IMPRESSION: Patient is a 62 y.o. male who was seen today for physical therapy evaluation and treatment for weakness, gait deficits, balance deficits.  Exhibits high risk for falls per outcome measures and general LE weakness with balance deficits and LOB during unsupported standing which corresponds with decreased independence with ADL participation. PT services indicated to improve functional independence to facilitate reutrn to PLOF   OBJECTIVE IMPAIRMENTS: Abnormal gait, cardiopulmonary status limiting activity, decreased activity tolerance, decreased balance, decreased coordination, decreased endurance, decreased knowledge of use of DME, difficulty walking, decreased strength, impaired perceived functional ability, and pain.   ACTIVITY LIMITATIONS: carrying, lifting, bending, standing, squatting, stairs, transfers, bathing, dressing, reach over head, and locomotion level  PARTICIPATION LIMITATIONS: meal prep, cleaning, laundry, driving, shopping, community activity, occupation, and yard work  PERSONAL FACTORS: Time since onset of injury/illness/exacerbation and 1-2 comorbidities: PMH  are also affecting patient's functional outcome.   REHAB POTENTIAL: Excellent  CLINICAL DECISION MAKING: Evolving/moderate complexity  EVALUATION COMPLEXITY: Moderate  PLAN:  PT FREQUENCY: 1-2x/week  PT DURATION: 6 weeks  PLANNED INTERVENTIONS: Therapeutic exercises, Therapeutic activity, Neuromuscular re-education, Balance training, Gait training, Patient/Family education, Self Care, Joint mobilization, Stair training, Vestibular training, Canalith repositioning,  Orthotic/Fit training, DME instructions, Aquatic Therapy, Electrical stimulation, Cryotherapy, Moist heat, and Manual therapy  PLAN FOR NEXT SESSION: Berg Balance Test, corner balance activities, general LE strength   5:21 PM, 12/14/22 M. Shary Decamp, PT, DPT Physical Therapist- [place] Office Number: [phone]

## 2022-12-15 ENCOUNTER — Other Ambulatory Visit: Payer: Self-pay

## 2022-12-15 ENCOUNTER — Other Ambulatory Visit (HOSPITAL_COMMUNITY): Payer: Self-pay

## 2022-12-15 ENCOUNTER — Encounter (HOSPITAL_COMMUNITY): Payer: Self-pay

## 2022-12-16 ENCOUNTER — Other Ambulatory Visit: Payer: Self-pay

## 2022-12-19 ENCOUNTER — Ambulatory Visit: Payer: Medicaid Other | Admitting: Physical Therapy

## 2022-12-23 ENCOUNTER — Other Ambulatory Visit (HOSPITAL_COMMUNITY): Payer: Self-pay

## 2022-12-23 ENCOUNTER — Ambulatory Visit: Payer: Medicaid Other

## 2022-12-23 DIAGNOSIS — R262 Difficulty in walking, not elsewhere classified: Secondary | ICD-10-CM

## 2022-12-23 DIAGNOSIS — M6281 Muscle weakness (generalized): Secondary | ICD-10-CM | POA: Diagnosis not present

## 2022-12-23 DIAGNOSIS — R2689 Other abnormalities of gait and mobility: Secondary | ICD-10-CM

## 2022-12-23 DIAGNOSIS — R2681 Unsteadiness on feet: Secondary | ICD-10-CM

## 2022-12-23 MED ORDER — HYDROCODONE-ACETAMINOPHEN 10-325 MG PO TABS
1.0000 | ORAL_TABLET | ORAL | 0 refills | Status: DC | PRN
Start: 2022-12-23 — End: 2023-02-05
  Filled 2022-12-23: qty 30, 5d supply, fill #0

## 2022-12-23 NOTE — Therapy (Signed)
OUTPATIENT PHYSICAL THERAPY NEURO TREATMENT   Patient Name: David [name] MRN: [id number] DOB:08/28/1960, 62 y.o., male Today's Date: 12/23/2022   PCP: [name], MD REFERRING PROVIDER: Eleonore Chiquito, FNP  END OF SESSION:  PT End of Session - 12/23/22 0820     Visit Number 2    Number of Visits 12    Date for PT Re-Evaluation 01/25/23    Authorization Type Medicaid-[employer]    Authorization - Visit Number 1    Authorization - Number of Visits 27    PT Start Time 0820    PT Stop Time 0905    PT Time Calculation (min) 45 min             Past Medical History:  Diagnosis Date   Cardiomyopathy (HCC)    a. EF 45% in 2019.   CHF (congestive heart failure) (HCC)    Chronic anticoagulation 09/30/2017   Diabetes mellitus type 2 in nonobese [facility])    Discitis of thoracic region 09/25/2022   Does not have health insurance    Essential hypertension 09/26/2017   Financial difficulties    H/O noncompliance with medical treatment, presenting hazards to health    Hardware complicating wound infection (HCC) 09/25/2022   Non-insulin treated type 2 diabetes mellitus (HCC) 09/26/2017   Persistent atrial fibrillation (HCC)    Sleep apnea suspected 09/30/2017   Past Surgical History:  Procedure Laterality Date   ABDOMINAL AORTOGRAM W/LOWER EXTREMITY N/A 09/02/2021   Procedure: ABDOMINAL AORTOGRAM W/LOWER EXTREMITY;  Surgeon: Nada Libman, MD;  Location: [facility];  Service: Cardiovascular;  Laterality: N/A;   ABDOMINAL AORTOGRAM W/LOWER EXTREMITY N/A 12/28/2021   Procedure: ABDOMINAL AORTOGRAM W/LOWER EXTREMITY;  Surgeon: Nada Libman, MD;  Location: [facility];  Service: Cardiovascular;  Laterality: N/A;   ABDOMINAL AORTOGRAM W/LOWER EXTREMITY N/A 07/28/2022   Procedure: ABDOMINAL AORTOGRAM W/LOWER EXTREMITY;  Surgeon: Cephus Shelling, MD;  Location: [facility];  Service: Cardiovascular;  Laterality: N/A;   AMPUTATION Right 09/04/2021    Procedure: 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;  Surgeon: Nadara Mustard, MD;  Location: [facility] OR;  Service: Orthopedics;  Laterality: Right;   AMPUTATION Right 08/02/2022   Procedure: AMPUTATION RAY 5TH METATARSAL OF RIGHT FOOT;  Surgeon: [name], MD;  Location: [facility] OR;  Service: Orthopedics;  Laterality: Right;   APPENDECTOMY  1971   BONE BIOPSY Right 08/02/2022   Procedure: BONE BIOPSY OF 5TH METATARSAL RIGHT FOOT;  Surgeon: [name], MD;  Location: [facility] OR;  Service: Orthopedics;  Laterality: Right;   CARDIOVERSION N/A 09/28/2017   Procedure: CARDIOVERSION;  Surgeon: Thurmon Fair, MD;  Location: [facility];  Service: Cardiovascular;  Laterality: N/A;   IRRIGATION AND DEBRIDEMENT FOOT  08/02/2022   Procedure: IRRIGATION AND DEBRIDEMENT RIGHT 5TH METATARSAL FOOT;  Surgeon: [name], MD;  Location: [facility] OR;  Service: Orthopedics;;   LAMINECTOMY WITH POSTERIOR LATERAL ARTHRODESIS LEVEL 4 N/A 08/30/2022   Procedure: THORACIC FIVE-THORACIC ELEVEN FUSION, THORACIC EIGHT TRANSPEDICULAR DECOMPRESSION AND PARTIAL CORPECTOMY with O-Arm;  Surgeon: Bedelia Person, MD;  Location: [facility] OR;  Service: Neurosurgery;  Laterality: N/A;   PERIPHERAL VASCULAR BALLOON ANGIOPLASTY  09/02/2021   Procedure: PERIPHERAL VASCULAR BALLOON ANGIOPLASTY;  Surgeon: Nada Libman, MD;  Location: [facility];  Service: Cardiovascular;;   PERIPHERAL VASCULAR BALLOON ANGIOPLASTY  12/28/2021   Procedure: PERIPHERAL VASCULAR BALLOON ANGIOPLASTY;  Surgeon: Nada Libman, MD;  Location: [facility];  Service: Cardiovascular;;  Posterior Tibial PTA only  TEE WITHOUT CARDIOVERSION N/A 09/28/2017   Procedure: TRANSESOPHAGEAL ECHOCARDIOGRAM (TEE);  Surgeon: Thurmon Fair, MD;  Location[facility] ENDOSCOPY;  Service: Cardiovascular;  Laterality: N/A;   TEE WITHOUT CARDIOVERSION N/A 09/03/2021   Procedure: TRANSESOPHAGEAL ECHOCARDIOGRAM (TEE);  Surgeon: Chilton Si, MD;  Location: [facility] ENDOSCOPY;  Service:  Cardiovascular;  Laterality: N/A;   TEE WITHOUT CARDIOVERSION N/A 07/15/2022   Procedure: TRANSESOPHAGEAL ECHOCARDIOGRAM;  Surgeon: Thurmon Fair, MD;  Location: [facility];  Service: Cardiovascular;  Laterality: N/A;   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 08/08/2022   MRSA bacteremia 08/08/2022   AKI (acute kidney injury), ruled out 07/27/2022   Discitis thoracic region 07/27/2022   Bacteremia due to methicillin resistant Staphylococcus aureus 07/27/2022   Severe protein-calorie malnutrition (HCC) 07/26/2022   PVD (peripheral vascular disease) (HCC) 07/26/2022   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   Bacteremia 07/15/2022   Acute respiratory failure with hypoxia (HCC) 07/12/2022   Atelectasis 07/12/2022   Sepsis due to pneumonia (HCC) 07/12/2022   Gangrene of toe of right foot (HCC)    Sepsis (HCC) 08/29/2021   SIRS (systemic inflammatory response syndrome) (HCC) 08/29/2021   Hyponatremia 08/29/2021   Hypomagnesemia 08/29/2021   Pure hypercholesterolemia 06/17/2021   Pain of left hand 09/06/2019   Cellulitis 08/26/2019   Persistent atrial fibrillation (HCC) 08/26/2019   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) 10/18/2018   Transaminitis 10/17/2018   Hyperbilirubinemia 10/17/2018   Leukocytosis 10/17/2018   Sinusitis 10/17/2018   Chronic anticoagulation 09/30/2017   Smoker  09/30/2017   Cardiomyopathy (HCC) 09/30/2017   Sleep apnea suspected 09/30/2017   Essential hypertension 09/26/2017   Uncontrolled type 2 diabetes with hyperglycemia and hypoglycemia, insulin long term use 09/26/2017   Atrial fibrillation with RVR (HCC) 09/26/2017    ONSET DATE: 08/20/22  REFERRING DIAG: M46.44ICD-10-CMDiscitis of thoracic region  THERAPY DIAG:  Muscle weakness (generalized)  Difficulty in walking, not elsewhere classified  Unsteadiness on feet  Other abnormalities of gait and mobility  Rationale for Evaluation and Treatment: Rehabilitation  SUBJECTIVE:  SUBJECTIVE STATEMENT: Still having a lot of back discomfort, depends on activity and the weather Pt accompanied by: significant other  PERTINENT HISTORY:  Per patient, he was admitted to the local hospital on Aug 20, 2022 and had back surgery thereafter. Patient was then readmitted after being discharged for infection with low BG. He was discharged on October 28, 2022 on oral antibiotics with changes to his insulin regimen. past medical history of PVD, type 2 diabetes with hyperglycemia/hypoglycemia, seizure disorders, hypertension, hyperlipidemia, A-fib (on Eliquis) and recent bacteremia.  He is s/p angioplasty.  Patient reports since discharge he has been doing physical therapy/exercises at home but continues to have severe back pain    PAIN:  Are you having pain? Yes: NPRS scale: 6/10 Pain location: mid back and radiating Pain description: stabbing Aggravating factors: activity and with rest Relieving factors: pain medication  PRECAUTIONS: Back  RED FLAGS:    WEIGHT BEARING RESTRICTIONS: No  FALLS: Has patient fallen in last 6 months? No  LIVING ENVIRONMENT: Lives with: lives with their spouse Lives in:  House/apartment, pt reports housing issues since out of work Stairs: No Has following equipment at home: Environmental consultant - 2 wheeled  PLOF: Independent, previously worked as Photographer  PATIENT GOALS: be able to return to normal, walking without AD  OBJECTIVE:   TODAY'S TREATMENT: 12/23/22 Activity Comments  LE PRE 3x10 -LAQ 3# -hip add iso -seated clamshells -seated hamstring curls  Gait training -use of SPC and CGA for guarding and sequence level surfaces  Balance training  -standing on foam: EO/EC 2x30 sec. Head turns EO/EC 2x3 reps -rocker board x 2 min: lateral/ant-post  NU-step resistance intervals x 7 min To fatigue, 30 sec heavy; 30 sec light          PATIENT EDUCATION: Education details: assessment details, rationale of PT intervention Person educated: Patient Education method: Explanation Education comprehension: verbalized understanding  HOME EXERCISE PROGRAM: Access Code: [email or web address] URL: [email or web address] Date: 12/23/2022 Prepared by: Shary Decamp  Exercises - Seated Knee Extension with Resistance  - 1 x daily - 7 x weekly - 3 sets - 10 reps - Seated Hamstring Curls with Resistance  - 1 x daily - 7 x weekly - 3 sets - 10 reps - Seated Hip Abduction with Resistance  - 1 x daily - 7 x weekly - 3 sets - 10 reps - Seated Hip Adduction Isometrics with Ball  - 1 x daily - 7 x weekly - 3 sets - 10 reps - Hooklying Clamshell with Resistance  - 1 x daily - 7 x weekly - 3 sets - 10 reps  DIAGNOSTIC FINDINGS: IMPRESSION: 1. Significantly regressed bilateral pleural effusions and improved bilateral ventilation since last month. 2. No new cardiopulmonary abnormality  COGNITION: Overall cognitive status: Within functional limits for tasks assessed   SENSATION: Proprioception: Impaired  Decreased LLE > RLE COORDINATION: Minimal impairment LLE  EDEMA:  none  MUSCLE TONE: NT      POSTURE: No Significant postural limitations  LOWER EXTREMITY  ROM:     Active  Right Eval Left Eval  Hip flexion    Hip extension    Hip abduction    Hip adduction    Hip internal rotation    Hip external rotation    Knee flexion 120 120  Knee extension 0 0  Ankle dorsiflexion 15 10  Ankle plantarflexion    Ankle inversion    Ankle eversion     (Blank rows = not tested)  LOWER EXTREMITY MMT:  MMT Right Eval Left Eval  Hip flexion 3+ 3+  Hip extension    Hip abduction 3+ 3+  Hip adduction 4 4  Hip internal rotation    Hip external rotation    Knee flexion 4+ 4  Knee extension 5 4-  Ankle dorsiflexion 4+ 3+  Ankle plantarflexion 3+ 3+  Ankle inversion    Ankle eversion    (Blank rows = not tested)  BED MOBILITY:  Modified indep due to back precautions  TRANSFERS: Assistive device utilized: Environmental consultant - 2 wheeled  Sit to stand: Modified independence Stand to sit: Modified independence Chair to chair: Modified independence Floor:  NT    CURB:  Level of Assistance: SBA Assistive device utilized: Environmental consultant - 2 wheeled Curb Comments:   STAIRS: Level of Assistance: SBA Stair Negotiation Technique: Step to Pattern Alternating Pattern  with Bilateral Rails Number of Stairs: 8  Height of Stairs: 4-6"  Comments:   GAIT: Gait pattern: Left foot flat Distance walked:  Assistive device utilized: Walker - 2 wheeled Level of assistance: Modified independence Comments: ambulates with good stride, decreased speed. Without AD ambulates with past retracts  FUNCTIONAL TESTS:  5 times sit to stand: 25 sec w/ BUE Timed up and go (TUG): 19 sec  M-CTSIB  Condition 1: Firm Surface, EO 30 Sec, Moderate Sway  Condition 2: Firm Surface, EC 10 Sec,  left LOB  Sway  Condition 3: Foam Surface, EO  Sec,  Sway  Condition 4: Foam Surface, EC  Sec,  Sway         GOALS: Goals reviewed with patient? Yes  SHORT TERM GOALS: Target date: 01/11/2023    Patient will be independent in HEP to improve functional outcomes Baseline: Goal  status: IN PROGRESS  2.  Patient will achieve 12 seconds for TUG test to manifest reduced risk for falls Baseline: 19 w/ RW Goal status: IN PROGRESS  3.  Demo improved BLE strength and reduced risk for falls per time 15 sec 5xSTS test Baseline: 25 sec w/ BUE push-off Goal status: IN PROGRESS  4. Demo modified independent stair ambulation with reciprocal pattern and single HR to improve community mobility  Baseline: SBA, BHR  Goal status: IN PROGRESS  LONG TERM GOALS: Target date: 01/25/2023    Demo low risk for falls per score 50/56 Berg Balance Test Baseline: TBD Goal status: INITIAL  2.  Demo independent ambulation level surfaces w/out AD x 150 ft to improve independence and safety with household ambulation Baseline: dependent on RW Goal status: INITIAL  3.  Demo gross BLE strength to 4+/5 to improve activity tolerance and stability for unsupported standing/gait Baseline:  Goal status: INITIAL    ASSESSMENT:  CLINICAL IMPRESSION: Returns to clinic with ongoing back pain and report of dependence on AD for mobility. Initiated with HEP development for seated LE PRE using red resistance loop. Gait training with single point cane to normalize pattern and improve safety with less restrictive AD with improved carryover of sequence by end of training achieving reciprocal steps with uneven step length. Multi-sensory balance activities to improve proprioception and postural awareness with tendency for left LOB under these conditions. Ended with cardiovascular training on NU-step LE only to avoid trunk rotation due to spinal precautions to improve activity tolerance with onset of fatigue and stopping at 7 min Xylan.  Pt reports he has meeting spine surgeon next week and I asked him to clarify use of TENs unit and if back precautions are lifted if we could initiate aquatic  therapy  OBJECTIVE IMPAIRMENTS: Abnormal gait, cardiopulmonary status limiting activity, decreased activity tolerance,  decreased balance, decreased coordination, decreased endurance, decreased knowledge of use of DME, difficulty walking, decreased strength, impaired perceived functional ability, and pain.   ACTIVITY LIMITATIONS: carrying, lifting, bending, standing, squatting, stairs, transfers, bathing, dressing, reach over head, and locomotion level  PARTICIPATION LIMITATIONS: meal prep, cleaning, laundry, driving, shopping, community activity, occupation, and yard work  PERSONAL FACTORS: Time since onset of injury/illness/exacerbation and 1-2 comorbidities: PMH  are also affecting patient's functional outcome.   REHAB POTENTIAL: Excellent  CLINICAL DECISION MAKING: Evolving/moderate complexity  EVALUATION COMPLEXITY: Moderate  PLAN:  PT FREQUENCY: 1-2x/week  PT DURATION: 6 weeks  PLANNED INTERVENTIONS: Therapeutic exercises, Therapeutic activity, Neuromuscular re-education, Balance training, Gait training, Patient/Family education, Self Care, Joint mobilization, Stair training, Vestibular training, Canalith repositioning, Orthotic/Fit training, DME instructions, Aquatic Therapy, Electrical stimulation, Cryotherapy, Moist heat, and Manual therapy  PLAN FOR NEXT SESSION: Berg Balance Test, corner balance activities, general LE strength. Use of TENS? Aquatic PT?   8:23 AM, 12/23/22 M. Shary Decamp, PT, DPT Physical Therapist- [place] Office Number: [phone]

## 2022-12-27 ENCOUNTER — Ambulatory Visit: Payer: Medicaid Other

## 2022-12-27 DIAGNOSIS — R2689 Other abnormalities of gait and mobility: Secondary | ICD-10-CM

## 2022-12-27 DIAGNOSIS — M6281 Muscle weakness (generalized): Secondary | ICD-10-CM

## 2022-12-27 DIAGNOSIS — R262 Difficulty in walking, not elsewhere classified: Secondary | ICD-10-CM

## 2022-12-27 DIAGNOSIS — R2681 Unsteadiness on feet: Secondary | ICD-10-CM

## 2022-12-27 NOTE — Therapy (Signed)
OUTPATIENT PHYSICAL THERAPY NEURO TREATMENT   Patient Name: David [name] MRN: [id number] DOB:01/20/1961, 62 y.o., male Today's Date: 12/27/2022   PCP: [name], MD REFERRING PROVIDER: Eleonore Chiquito, FNP  END OF SESSION:  PT End of Session - 12/27/22 1619     Visit Number 3    Number of Visits 12    Date for PT Re-Evaluation 01/25/23    Authorization Type Medicaid-[employer]    Authorization - Visit Number 3    Authorization - Number of Visits 27    PT Start Time 1615    PT Stop Time 1700    PT Time Calculation (min) 45 min             Past Medical History:  Diagnosis Date   Cardiomyopathy (HCC)    a. EF 45% in 2019.   CHF (congestive heart failure) (HCC)    Chronic anticoagulation 09/30/2017   Diabetes mellitus type 2 in nonobese [facility])    Discitis of thoracic region 09/25/2022   Does not have health insurance    Essential hypertension 09/26/2017   Financial difficulties    H/O noncompliance with medical treatment, presenting hazards to health    Hardware complicating wound infection (HCC) 09/25/2022   Non-insulin treated type 2 diabetes mellitus (HCC) 09/26/2017   Persistent atrial fibrillation (HCC)    Sleep apnea suspected 09/30/2017   Past Surgical History:  Procedure Laterality Date   ABDOMINAL AORTOGRAM W/LOWER EXTREMITY N/A 09/02/2021   Procedure: ABDOMINAL AORTOGRAM W/LOWER EXTREMITY;  Surgeon: Nada Libman, MD;  Location: [facility];  Service: Cardiovascular;  Laterality: N/A;   ABDOMINAL AORTOGRAM W/LOWER EXTREMITY N/A 12/28/2021   Procedure: ABDOMINAL AORTOGRAM W/LOWER EXTREMITY;  Surgeon: Nada Libman, MD;  Location: [facility];  Service: Cardiovascular;  Laterality: N/A;   ABDOMINAL AORTOGRAM W/LOWER EXTREMITY N/A 07/28/2022   Procedure: ABDOMINAL AORTOGRAM W/LOWER EXTREMITY;  Surgeon: Cephus Shelling, MD;  Location: [facility];  Service: Cardiovascular;  Laterality: N/A;   AMPUTATION Right 09/04/2021    Procedure: 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;  Surgeon: Nadara Mustard, MD;  Location: [facility] OR;  Service: Orthopedics;  Laterality: Right;   AMPUTATION Right 08/02/2022   Procedure: AMPUTATION RAY 5TH METATARSAL OF RIGHT FOOT;  Surgeon: [name], MD;  Location: [facility] OR;  Service: Orthopedics;  Laterality: Right;   APPENDECTOMY  1971   BONE BIOPSY Right 08/02/2022   Procedure: BONE BIOPSY OF 5TH METATARSAL RIGHT FOOT;  Surgeon: [name], MD;  Location: [facility] OR;  Service: Orthopedics;  Laterality: Right;   CARDIOVERSION N/A 09/28/2017   Procedure: CARDIOVERSION;  Surgeon: Thurmon Fair, MD;  Location: [facility];  Service: Cardiovascular;  Laterality: N/A;   IRRIGATION AND DEBRIDEMENT FOOT  08/02/2022   Procedure: IRRIGATION AND DEBRIDEMENT RIGHT 5TH METATARSAL FOOT;  Surgeon: [name], MD;  Location: [facility] OR;  Service: Orthopedics;;   LAMINECTOMY WITH POSTERIOR LATERAL ARTHRODESIS LEVEL 4 N/A 08/30/2022   Procedure: THORACIC FIVE-THORACIC ELEVEN FUSION, THORACIC EIGHT TRANSPEDICULAR DECOMPRESSION AND PARTIAL CORPECTOMY with O-Arm;  Surgeon: Bedelia Person, MD;  Location: [facility] OR;  Service: Neurosurgery;  Laterality: N/A;   PERIPHERAL VASCULAR BALLOON ANGIOPLASTY  09/02/2021   Procedure: PERIPHERAL VASCULAR BALLOON ANGIOPLASTY;  Surgeon: Nada Libman, MD;  [facility]: [facility];  Service: Cardiovascular;;   PERIPHERAL VASCULAR BALLOON ANGIOPLASTY  12/28/2021   Procedure: PERIPHERAL VASCULAR BALLOON ANGIOPLASTY;  Surgeon: Nada Libman, MD;  [facility]: [facility];  Service: Cardiovascular;;  Posterior Tibial PTA only  TEE WITHOUT CARDIOVERSION N/A 09/28/2017   Procedure: TRANSESOPHAGEAL ECHOCARDIOGRAM (TEE);  Surgeon: Thurmon Fair, MD;  Location: [facility] ENDOSCOPY;  Service: Cardiovascular;  Laterality: N/A;   TEE WITHOUT CARDIOVERSION N/A 09/03/2021   Procedure: TRANSESOPHAGEAL ECHOCARDIOGRAM (TEE);  Surgeon: Chilton Si, MD;  Location: [facility] ENDOSCOPY;  Service:  Cardiovascular;  Laterality: N/A;   TEE WITHOUT CARDIOVERSION N/A 07/15/2022   Procedure: TRANSESOPHAGEAL ECHOCARDIOGRAM;  Surgeon: Thurmon Fair, MD;  [facility];  Service: Cardiovascular;  Laterality: N/A;   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 08/08/2022   MRSA bacteremia 08/08/2022   AKI (acute kidney injury), ruled out 07/27/2022   Discitis thoracic region 07/27/2022   Bacteremia due to methicillin resistant Staphylococcus aureus 07/27/2022   Severe protein-calorie malnutrition (HCC) 07/26/2022   PVD (peripheral vascular disease) (HCC) 07/26/2022   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   Bacteremia 07/15/2022   Acute respiratory failure with hypoxia (HCC) 07/12/2022   Atelectasis 07/12/2022   Sepsis due to pneumonia (HCC) 07/12/2022   Gangrene of toe of right foot (HCC)    Sepsis (HCC) 08/29/2021   SIRS (systemic inflammatory response syndrome) (HCC) 08/29/2021   Hyponatremia 08/29/2021   Hypomagnesemia 08/29/2021   Pure hypercholesterolemia 06/17/2021   Pain of left hand 09/06/2019   Cellulitis 08/26/2019   Persistent atrial fibrillation (HCC) 08/26/2019   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) 10/18/2018   Transaminitis 10/17/2018   Hyperbilirubinemia 10/17/2018   Leukocytosis 10/17/2018   Sinusitis 10/17/2018   Chronic anticoagulation 09/30/2017   Smoker  09/30/2017   Cardiomyopathy (HCC) 09/30/2017   Sleep apnea suspected 09/30/2017   Essential hypertension 09/26/2017   Uncontrolled type 2 diabetes with hyperglycemia and hypoglycemia, insulin long term use 09/26/2017   Atrial fibrillation with RVR (HCC) 09/26/2017    ONSET DATE: 08/20/22  REFERRING DIAG: M46.44ICD-10-CMDiscitis of thoracic region  THERAPY DIAG:  Muscle weakness (generalized)  Difficulty in walking, not elsewhere classified  Unsteadiness on feet  Other abnormalities of gait and mobility  Rationale for Evaluation and Treatment: Rehabilitation  SUBJECTIVE:  SUBJECTIVE STATEMENT: Seeing neurosurgeon tomorrow, no new issues. Sore x 1 day after last session Pt accompanied by: significant other  PERTINENT HISTORY:  Per patient, he was admitted to the local hospital on Aug 20, 2022 and had back surgery thereafter. Patient was then readmitted after being discharged for infection with low BG. He was discharged on October 28, 2022 on oral antibiotics with changes to his insulin regimen. past medical history of PVD, type 2 diabetes with hyperglycemia/hypoglycemia, seizure disorders, hypertension, hyperlipidemia, A-fib (on Eliquis) and recent bacteremia.  He is s/p angioplasty.  Patient reports since discharge he has been doing physical therapy/exercises at home but continues to have severe back pain    PAIN:  Are you having pain? Yes: NPRS scale: 7/10 Pain location: mid back and radiating Pain description: stabbing Aggravating factors: activity and with rest Relieving factors: pain medication  PRECAUTIONS: Back  RED FLAGS:    WEIGHT BEARING RESTRICTIONS: No  FALLS: Has patient fallen in last 6 months? No  LIVING ENVIRONMENT: Lives with: lives with their spouse Lives in:  House/apartment, pt reports housing issues since out of work Stairs: No Has following equipment at home: Environmental consultant - 2 wheeled  PLOF: Independent, previously worked as Photographer  PATIENT GOALS: be able to return to normal, walking without AD  OBJECTIVE:   TODAY'S TREATMENT: 12/27/22 Activity Comments  LAQ 3x10 5#  Standing hip abd 2x10 5#  Standing hamstring curls 2x10 5#  Sidestepping x 2 min 5# along counter  Seated hamstring curls 1x10 10-15#  Static sitting on dynadisc 2x10 iso-ball squeeze 6x15 sec postural perturbations  NU-step LE only resistance intervals x 8 min -2 min warmup at level 5 -30 sec heavy; 30 sec light     TODAY'S TREATMENT: 12/23/22 Activity Comments  LE PRE 3x10 -LAQ 3# -hip add iso -seated clamshells -seated hamstring curls  Gait training -use of SPC and CGA for guarding and sequence level surfaces  Balance training  -standing on foam: EO/EC 2x30 sec. Head turns EO/EC 2x3 reps -rocker board x 2 min: lateral/ant-post  NU-step resistance intervals x 7 min To fatigue, 30 sec heavy; 30 sec light          PATIENT EDUCATION: Education details: assessment details, rationale of PT intervention Person educated: Patient Education method: Explanation Education comprehension: verbalized understanding  HOME EXERCISE PROGRAM: Access Code: [email or web address] URL: [email or web address] Date: 12/23/2022 Prepared by: Shary Decamp  Exercises - Seated Knee Extension with Resistance  - 1 x daily - 7 x weekly - 3 sets - 10 reps - Seated Hamstring Curls with Resistance  - 1 x daily - 7 x weekly - 3 sets - 10 reps - Seated Hip Abduction with Resistance  - 1 x daily - 7 x weekly - 3 sets - 10 reps - Seated Hip Adduction Isometrics with Ball  - 1 x daily - 7 x weekly - 3 sets - 10 reps - Hooklying Clamshell with Resistance  - 1 x daily - 7 x weekly - 3 sets - 10 reps  DIAGNOSTIC FINDINGS: IMPRESSION: 1. Significantly regressed bilateral pleural effusions  and improved bilateral ventilation since last month. 2. No new cardiopulmonary abnormality  COGNITION: Overall cognitive status: Within functional limits for tasks assessed   SENSATION: Proprioception: Impaired  Decreased LLE > RLE COORDINATION: Minimal impairment LLE  EDEMA:  none  MUSCLE TONE: NT      POSTURE: No Significant postural limitations  LOWER EXTREMITY ROM:     Active  Right Eval Left Eval  Hip  flexion    Hip extension    Hip abduction    Hip adduction    Hip internal rotation    Hip external rotation    Knee flexion 120 120  Knee extension 0 0  Ankle dorsiflexion 15 10  Ankle plantarflexion    Ankle inversion    Ankle eversion     (Blank rows = not tested)  LOWER EXTREMITY MMT:    MMT Right Eval Left Eval  Hip flexion 3+ 3+  Hip extension    Hip abduction 3+ 3+  Hip adduction 4 4  Hip internal rotation    Hip external rotation    Knee flexion 4+ 4  Knee extension 5 4-  Ankle dorsiflexion 4+ 3+  Ankle plantarflexion 3+ 3+  Ankle inversion    Ankle eversion    (Blank rows = not tested)  BED MOBILITY:  Modified indep due to back precautions  TRANSFERS: Assistive device utilized: Environmental consultant - 2 wheeled  Sit to stand: Modified independence Stand to sit: Modified independence Chair to chair: Modified independence Floor:  NT    CURB:  Level of Assistance: SBA Assistive device utilized: Environmental consultant - 2 wheeled Curb Comments:   STAIRS: Level of Assistance: SBA Stair Negotiation Technique: Step to Pattern Alternating Pattern  with Bilateral Rails Number of Stairs: 8  Height of Stairs: 4-6"  Comments:   GAIT: Gait pattern: Left foot flat Distance walked:  Assistive device utilized: Walker - 2 wheeled Level of assistance: Modified independence Comments: ambulates with good stride, decreased speed. Without AD ambulates with past retracts  FUNCTIONAL TESTS:  5 times sit to stand: 25 sec w/ BUE Timed up and go (TUG): 19  sec  M-CTSIB  Condition 1: Firm Surface, EO 30 Sec, Moderate Sway  Condition 2: Firm Surface, EC 10 Sec,  left LOB  Sway  Condition 3: Foam Surface, EO  Sec,  Sway  Condition 4: Foam Surface, EC  Sec,  Sway         GOALS: Goals reviewed with patient? Yes  SHORT TERM GOALS: Target date: 01/11/2023    Patient will be independent in HEP to improve functional outcomes Baseline: Goal status: IN PROGRESS  2.  Patient will achieve 12 seconds for TUG test to manifest reduced risk for falls Baseline: 19 w/ RW Goal status: IN PROGRESS  3.  Demo improved BLE strength and reduced risk for falls per time 15 sec 5xSTS test Baseline: 25 sec w/ BUE push-off Goal status: IN PROGRESS  4. Demo modified independent stair ambulation with reciprocal pattern and single HR to improve community mobility  Baseline: SBA, BHR  Goal status: IN PROGRESS  LONG TERM GOALS: Target date: 01/25/2023    Demo low risk for falls per score 50/56 Berg Balance Test Baseline: TBD Goal status: INITIAL  2.  Demo independent ambulation level surfaces w/out AD x 150 ft to improve independence and safety with household ambulation Baseline: dependent on RW Goal status: INITIAL  3.  Demo gross BLE strength to 4+/5 to improve activity tolerance and stability for unsupported standing/gait Baseline:  Goal status: INITIAL    ASSESSMENT:  CLINICAL IMPRESSION: Continued with general strength training to improve recruitment and general conditioning tolerating increased resistance well albeit with reduced reps to accommodate.  Focus on isolation training at this time to maintain back precautions.  Initiated isometric core/trunk strength by way of sitting on unstable surfaces and imposing postural perturbations. Ended with resistance cardio intervals on NU-step LE only to maintain back precautions for  metabolic conditioning. Continued sessions to progress POC details and meet functional objectives.   OBJECTIVE  IMPAIRMENTS: Abnormal gait, cardiopulmonary status limiting activity, decreased activity tolerance, decreased balance, decreased coordination, decreased endurance, decreased knowledge of use of DME, difficulty walking, decreased strength, impaired perceived functional ability, and pain.   ACTIVITY LIMITATIONS: carrying, lifting, bending, standing, squatting, stairs, transfers, bathing, dressing, reach over head, and locomotion level  PARTICIPATION LIMITATIONS: meal prep, cleaning, laundry, driving, shopping, community activity, occupation, and yard work  PERSONAL FACTORS: Time since onset of injury/illness/exacerbation and 1-2 comorbidities: PMH  are also affecting patient's functional outcome.   REHAB POTENTIAL: Excellent  CLINICAL DECISION MAKING: Evolving/moderate complexity  EVALUATION COMPLEXITY: Moderate  PLAN:  PT FREQUENCY: 1-2x/week  PT DURATION: 6 weeks  PLANNED INTERVENTIONS: Therapeutic exercises, Therapeutic activity, Neuromuscular re-education, Balance training, Gait training, Patient/Family education, Self Care, Joint mobilization, Stair training, Vestibular training, Canalith repositioning, Orthotic/Fit training, DME instructions, Aquatic Therapy, Electrical stimulation, Cryotherapy, Moist heat, and Manual therapy  PLAN FOR NEXT SESSION: Berg Balance Test, corner balance activities, general LE strength. Use of TENS? Aquatic PT?   4:20 PM, 12/27/22 M. Shary Decamp, PT, DPT Physical Therapist- [place] Office Number: [phone]

## 2022-12-29 ENCOUNTER — Other Ambulatory Visit (HOSPITAL_COMMUNITY): Payer: Self-pay

## 2022-12-29 ENCOUNTER — Encounter: Payer: Self-pay | Admitting: Family Medicine

## 2022-12-29 ENCOUNTER — Ambulatory Visit: Payer: Medicaid Other | Attending: Family Medicine | Admitting: Family Medicine

## 2022-12-29 ENCOUNTER — Telehealth: Payer: Self-pay

## 2022-12-29 ENCOUNTER — Ambulatory Visit: Payer: Medicaid Other

## 2022-12-29 ENCOUNTER — Other Ambulatory Visit: Payer: Self-pay

## 2022-12-29 VITALS — BP 144/75 | HR 98 | Ht 72.0 in | Wt 170.2 lb

## 2022-12-29 DIAGNOSIS — Z794 Long term (current) use of insulin: Secondary | ICD-10-CM

## 2022-12-29 DIAGNOSIS — I739 Peripheral vascular disease, unspecified: Secondary | ICD-10-CM | POA: Diagnosis not present

## 2022-12-29 DIAGNOSIS — M4644 Discitis, unspecified, thoracic region: Secondary | ICD-10-CM

## 2022-12-29 DIAGNOSIS — B351 Tinea unguium: Secondary | ICD-10-CM

## 2022-12-29 DIAGNOSIS — I4819 Other persistent atrial fibrillation: Secondary | ICD-10-CM | POA: Diagnosis not present

## 2022-12-29 DIAGNOSIS — M6281 Muscle weakness (generalized): Secondary | ICD-10-CM | POA: Diagnosis not present

## 2022-12-29 DIAGNOSIS — R2681 Unsteadiness on feet: Secondary | ICD-10-CM

## 2022-12-29 DIAGNOSIS — R2689 Other abnormalities of gait and mobility: Secondary | ICD-10-CM

## 2022-12-29 DIAGNOSIS — E1169 Type 2 diabetes mellitus with other specified complication: Secondary | ICD-10-CM

## 2022-12-29 DIAGNOSIS — M546 Pain in thoracic spine: Secondary | ICD-10-CM

## 2022-12-29 DIAGNOSIS — I5020 Unspecified systolic (congestive) heart failure: Secondary | ICD-10-CM | POA: Diagnosis not present

## 2022-12-29 DIAGNOSIS — R262 Difficulty in walking, not elsewhere classified: Secondary | ICD-10-CM

## 2022-12-29 MED ORDER — POLYETHYLENE GLYCOL 3350 17 GM/SCOOP PO POWD
17.0000 g | Freq: Every day | ORAL | 3 refills | Status: DC
  Filled 2022-12-29: qty 510, 30d supply, fill #0

## 2022-12-29 MED ORDER — TERBINAFINE HCL 250 MG PO TABS
250.0000 mg | ORAL_TABLET | Freq: Every day | ORAL | 0 refills | Status: DC
Start: 2022-12-29 — End: 2023-03-01
  Filled 2022-12-29 – 2023-02-06 (×2): qty 30, 30d supply, fill #0
  Filled 2023-02-06 (×2): qty 30, 30d supply, fill #1

## 2022-12-29 MED ORDER — LANTUS SOLOSTAR 100 UNIT/ML ~~LOC~~ SOPN
25.0000 [IU] | PEN_INJECTOR | Freq: Every day | SUBCUTANEOUS | 6 refills | Status: DC
  Filled 2022-12-29: qty 30, 120d supply, fill #0
  Filled 2023-01-02: qty 9, 36d supply, fill #0
  Filled 2023-02-06 (×2): qty 9, 36d supply, fill #1
  Filled 2023-02-06: qty 21, 84d supply, fill #0

## 2022-12-29 NOTE — Therapy (Signed)
OUTPATIENT PHYSICAL THERAPY NEURO TREATMENT   Patient Name: David [name] MRN: [id number] DOB:11/28/1960, 62 y.o., male Today's Date: 12/29/2022   PCP: [name], MD REFERRING PROVIDER: Eleonore Chiquito, FNP  END OF SESSION:  PT End of Session - 12/29/22 1138     Visit Number 4    Number of Visits 12    Date for PT Re-Evaluation 01/25/23    Authorization Type Medicaid-[employer]    Authorization - Visit Number 4    Authorization - Number of Visits 27    PT Start Time 1130    PT Stop Time 1215    PT Time Calculation (min) 45 min             Past Medical History:  Diagnosis Date   Cardiomyopathy (HCC)    a. EF 45% in 2019.   CHF (congestive heart failure) (HCC)    Chronic anticoagulation 09/30/2017   Diabetes mellitus type 2 in nonobese [facility])    Discitis of thoracic region 09/25/2022   Does not have health insurance    Essential hypertension 09/26/2017   Financial difficulties    H/O noncompliance with medical treatment, presenting hazards to health    Hardware complicating wound infection (HCC) 09/25/2022   Non-insulin treated type 2 diabetes mellitus (HCC) 09/26/2017   Persistent atrial fibrillation (HCC)    Sleep apnea suspected 09/30/2017   Past Surgical History:  Procedure Laterality Date   ABDOMINAL AORTOGRAM W/LOWER EXTREMITY N/A 09/02/2021   Procedure: ABDOMINAL AORTOGRAM W/LOWER EXTREMITY;  Surgeon: Nada Libman, MD;  Location: [facility];  Service: Cardiovascular;  Laterality: N/A;   ABDOMINAL AORTOGRAM W/LOWER EXTREMITY N/A 12/28/2021   Procedure: ABDOMINAL AORTOGRAM W/LOWER EXTREMITY;  Surgeon: Nada Libman, MD;  Location: [facility];  Service: Cardiovascular;  Laterality: N/A;   ABDOMINAL AORTOGRAM W/LOWER EXTREMITY N/A 07/28/2022   Procedure: ABDOMINAL AORTOGRAM W/LOWER EXTREMITY;  Surgeon: Cephus Shelling, MD;  Location: [facility];  Service: Cardiovascular;  Laterality: N/A;   AMPUTATION Right 09/04/2021    Procedure: 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;  Surgeon: Nadara Mustard, MD;  Location: [facility];  Service: Orthopedics;  Laterality: Right;   AMPUTATION Right 08/02/2022   Procedure: AMPUTATION RAY 5TH METATARSAL OF RIGHT FOOT;  Surgeon: [name], MD;  Location: [facility] OR;  Service: Orthopedics;  Laterality: Right;   APPENDECTOMY  1971   BONE BIOPSY Right 08/02/2022   Procedure: BONE BIOPSY OF 5TH METATARSAL RIGHT FOOT;  Surgeon: [name], MD;  Location: [facility] OR;  Service: Orthopedics;  Laterality: Right;   CARDIOVERSION N/A 09/28/2017   Procedure: CARDIOVERSION;  Surgeon: Thurmon Fair, MD;  Location: [facility];  Service: Cardiovascular;  Laterality: N/A;   IRRIGATION AND DEBRIDEMENT FOOT  08/02/2022   Procedure: IRRIGATION AND DEBRIDEMENT RIGHT 5TH METATARSAL FOOT;  Surgeon: [name], MD;  Location: [facility] OR;  Service: Orthopedics;;   LAMINECTOMY WITH POSTERIOR LATERAL ARTHRODESIS LEVEL 4 N/A 08/30/2022   Procedure: THORACIC FIVE-THORACIC ELEVEN FUSION, THORACIC EIGHT TRANSPEDICULAR DECOMPRESSION AND PARTIAL CORPECTOMY with O-Arm;  Surgeon: Bedelia Person, MD;  Location: [facility] OR;  Service: Neurosurgery;  Laterality: N/A;   PERIPHERAL VASCULAR BALLOON ANGIOPLASTY  09/02/2021   Procedure: PERIPHERAL VASCULAR BALLOON ANGIOPLASTY;  Surgeon: Nada Libman, MD;  [facility]: [facility];  Service: Cardiovascular;;   PERIPHERAL VASCULAR BALLOON ANGIOPLASTY  12/28/2021   Procedure: PERIPHERAL VASCULAR BALLOON ANGIOPLASTY;  Surgeon: Nada Libman, MD;  [facility];  Service: Cardiovascular;;  Posterior Tibial PTA only  TEE WITHOUT CARDIOVERSION N/A 09/28/2017   Procedure: TRANSESOPHAGEAL ECHOCARDIOGRAM (TEE);  Surgeon: Thurmon Fair, MD;  Location: [facility] ENDOSCOPY;  Service: Cardiovascular;  Laterality: N/A;   TEE WITHOUT CARDIOVERSION N/A 09/03/2021   Procedure: TRANSESOPHAGEAL ECHOCARDIOGRAM (TEE);  Surgeon: Chilton Si, MD;  Location: [facility] ENDOSCOPY;  Service:  Cardiovascular;  Laterality: N/A;   TEE WITHOUT CARDIOVERSION N/A 07/15/2022   Procedure: TRANSESOPHAGEAL ECHOCARDIOGRAM;  Surgeon: Thurmon Fair, MD;  [facility];  Service: Cardiovascular;  Laterality: N/A;   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 08/08/2022   MRSA bacteremia 08/08/2022   AKI (acute kidney injury), ruled out 07/27/2022   Discitis thoracic region 07/27/2022   Bacteremia due to methicillin resistant Staphylococcus aureus 07/27/2022   Severe protein-calorie malnutrition (HCC) 07/26/2022   PVD (peripheral vascular disease) (HCC) 07/26/2022   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   Bacteremia 07/15/2022   Acute respiratory failure with hypoxia (HCC) 07/12/2022   Atelectasis 07/12/2022   Sepsis due to pneumonia (HCC) 07/12/2022   Gangrene of toe of right foot (HCC)    Sepsis (HCC) 08/29/2021   SIRS (systemic inflammatory response syndrome) (HCC) 08/29/2021   Hyponatremia 08/29/2021   Hypomagnesemia 08/29/2021   Pure hypercholesterolemia 06/17/2021   Pain of left hand 09/06/2019   Cellulitis 08/26/2019   Persistent atrial fibrillation (HCC) 08/26/2019   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) 10/18/2018   Transaminitis 10/17/2018   Hyperbilirubinemia 10/17/2018   Leukocytosis 10/17/2018   Sinusitis 10/17/2018   Chronic anticoagulation 09/30/2017   Smoker  09/30/2017   Cardiomyopathy (HCC) 09/30/2017   Sleep apnea suspected 09/30/2017   Essential hypertension 09/26/2017   Uncontrolled type 2 diabetes with hyperglycemia and hypoglycemia, insulin long term use 09/26/2017   Atrial fibrillation with RVR (HCC) 09/26/2017    ONSET DATE: 08/20/22  REFERRING DIAG: M46.44ICD-10-CMDiscitis of thoracic region  THERAPY DIAG:  Muscle weakness (generalized)  Difficulty in walking, not elsewhere classified  Unsteadiness on feet  Other abnormalities of gait and mobility  Rationale for Evaluation and Treatment: Rehabilitation  SUBJECTIVE:  SUBJECTIVE STATEMENT: Saw neurosurgeon and everything is healing well, not required to wear TLSO all the time Pt accompanied by: significant other  PERTINENT HISTORY:  Per patient, he was admitted to the local hospital on Aug 20, 2022 and had back surgery thereafter. Patient was then readmitted after being discharged for infection with low BG. He was discharged on October 28, 2022 on oral antibiotics with changes to his insulin regimen. past medical history of PVD, type 2 diabetes with hyperglycemia/hypoglycemia, seizure disorders, hypertension, hyperlipidemia, A-fib (on Eliquis) and recent bacteremia.  He is s/p angioplasty.  Patient reports since discharge he has been doing physical therapy/exercises at home but continues to have severe back pain    PAIN:  Are you having pain? Yes: NPRS scale: 7/10 Pain location: mid back and radiating Pain description: stabbing Aggravating factors: activity and with rest Relieving factors: pain medication  PRECAUTIONS: Back  RED FLAGS:    WEIGHT BEARING RESTRICTIONS: No  FALLS: Has patient fallen in last 6 months? No  LIVING ENVIRONMENT: Lives with: lives with their spouse Lives in:  House/apartment, pt reports housing issues since out of work Stairs: No Has following equipment at home: Environmental consultant - 2 wheeled  PLOF: Independent, previously worked as Photographer  PATIENT GOALS: be able to return to normal, walking without AD  OBJECTIVE:   TODAY'S TREATMENT: 12/29/22 Activity Comments  TENS unit trial and education on use   Standing on foam, therapist stabilizing at left ankle to prevent inversion EO 2x60 sec Head turns eyes open 3x3 reps Eyes closed 1x30 sec  Standing on firm -feet together 2x60 sec eyes open -feet together 3x10 sec eyes closed  Seated scap add 1x10 W/ IR/ER green band  Seated t-band rows 2x10 Foot as anchor        TODAY'S TREATMENT: 12/27/22 Activity Comments  LAQ 3x10 5#  Standing hip abd 2x10 5#  Standing hamstring curls 2x10 5#  Sidestepping x 2 min 5# along counter  Seated hamstring curls 1x10 10-15#  Static sitting on dynadisc 2x10 iso-ball squeeze 6x15 sec postural perturbations  NU-step LE only resistance intervals x 8 min -2 min warmup at level 5 -30 sec heavy; 30 sec light     PATIENT EDUCATION: Education details: assessment details, rationale of PT intervention Person educated: Patient Education method: Explanation Education comprehension: verbalized understanding  HOME EXERCISE PROGRAM: Access Code: [email or web address] URL: [email or web address] Date: 12/23/2022 Prepared by: Shary Decamp  Exercises - Seated Knee Extension with Resistance  - 1 x daily - 7 x weekly - 3 sets - 10 reps - Seated Hamstring Curls with Resistance  - 1 x daily - 7 x weekly - 3 sets - 10 reps - Seated Hip Abduction with Resistance  - 1 x daily - 7 x weekly - 3 sets - 10 reps - Seated Hip Adduction Isometrics with Ball  - 1 x daily - 7 x weekly - 3 sets - 10 reps - Hooklying Clamshell with Resistance  - 1 x daily - 7 x weekly - 3 sets - 10 reps - Seated Shoulder Horizontal Abduction with Resistance  - 1 x daily - 7 x weekly - 3 sets - 10  reps - Seated Shoulder Row with Resistance Anchored at Feet  - 1 x daily - 7 x weekly - 3 sets - 10 reps  DIAGNOSTIC FINDINGS: IMPRESSION: 1. Significantly regressed bilateral pleural effusions and improved bilateral ventilation since last month. 2. No new cardiopulmonary abnormality  COGNITION: Overall cognitive status: Within functional limits for tasks  assessed   SENSATION: Proprioception: Impaired  Decreased LLE > RLE COORDINATION: Minimal impairment LLE  EDEMA:  none  MUSCLE TONE: NT      POSTURE: No Significant postural limitations  LOWER EXTREMITY ROM:     Active  Right Eval Left Eval  Hip flexion    Hip extension    Hip abduction    Hip adduction    Hip internal rotation    Hip external rotation    Knee flexion 120 120  Knee extension 0 0  Ankle dorsiflexion 15 10  Ankle plantarflexion    Ankle inversion    Ankle eversion     (Blank rows = not tested)  LOWER EXTREMITY MMT:    MMT Right Eval Left Eval  Hip flexion 3+ 3+  Hip extension    Hip abduction 3+ 3+  Hip adduction 4 4  Hip internal rotation    Hip external rotation    Knee flexion 4+ 4  Knee extension 5 4-  Ankle dorsiflexion 4+ 3+  Ankle plantarflexion 3+ 3+  Ankle inversion    Ankle eversion    (Blank rows = not tested)  BED MOBILITY:  Modified indep due to back precautions  TRANSFERS: Assistive device utilized: Environmental consultant - 2 wheeled  Sit to stand: Modified independence Stand to sit: Modified independence Chair to chair: Modified independence Floor:  NT    CURB:  Level of Assistance: SBA Assistive device utilized: Environmental consultant - 2 wheeled Curb Comments:   STAIRS: Level of Assistance: SBA Stair Negotiation Technique: Step to Pattern Alternating Pattern  with Bilateral Rails Number of Stairs: 8  Height of Stairs: 4-6"  Comments:   GAIT: Gait pattern: Left foot flat Distance walked:  Assistive device utilized: Walker - 2 wheeled Level of assistance: Modified  independence Comments: ambulates with good stride, decreased speed. Without AD ambulates with past retracts  FUNCTIONAL TESTS:  5 times sit to stand: 25 sec w/ BUE Timed up and go (TUG): 19 sec  M-CTSIB  Condition 1: Firm Surface, EO 30 Sec, Moderate Sway  Condition 2: Firm Surface, EC 10 Sec,  left LOB  Sway  Condition 3: Foam Surface, EO  Sec,  Sway  Condition 4: Foam Surface, EC  Sec,  Sway         GOALS: Goals reviewed with patient? Yes  SHORT TERM GOALS: Target date: 01/11/2023    Patient will be independent in HEP to improve functional outcomes Baseline: Goal status: IN PROGRESS  2.  Patient will achieve 12 seconds for TUG test to manifest reduced risk for falls Baseline: 19 w/ RW Goal status: IN PROGRESS  3.  Demo improved BLE strength and reduced risk for falls per time 15 sec 5xSTS test Baseline: 25 sec w/ BUE push-off Goal status: IN PROGRESS  4. Demo modified independent stair ambulation with reciprocal pattern and single HR to improve community mobility  Baseline: SBA, BHR  Goal status: IN PROGRESS  LONG TERM GOALS: Target date: 01/25/2023    Demo low risk for falls per score 50/56 Berg Balance Test Baseline: TBD Goal status: INITIAL  2.  Demo independent ambulation level surfaces w/out AD x 150 ft to improve independence and safety with household ambulation Baseline: dependent on RW Goal status: INITIAL  3.  Demo gross BLE strength to 4+/5 to improve activity tolerance and stability for unsupported standing/gait Baseline:  Goal status: INITIAL    ASSESSMENT:  CLINICAL IMPRESSION: Trial and demo of TENS unit on mid back to address pain and provide  education for self-use with good return demonstration of control operation. Will require assist for pad placement due to position. Balance/coodination activities to improve unsupported standing with multi-sensory conditions imposed and blocking to left ankle to prevent inversion due to  weakness/proprioception issues.  Demonstration and performance of upper back/scapular strengthening exercises with resistance band to improve upper body strength and improve postural correction. Continued sessions to advance POC details and improve mobility to reduce risk for falls  OBJECTIVE IMPAIRMENTS: Abnormal gait, cardiopulmonary status limiting activity, decreased activity tolerance, decreased balance, decreased coordination, decreased endurance, decreased knowledge of use of DME, difficulty walking, decreased strength, impaired perceived functional ability, and pain.   ACTIVITY LIMITATIONS: carrying, lifting, bending, standing, squatting, stairs, transfers, bathing, dressing, reach over head, and locomotion level  PARTICIPATION LIMITATIONS: meal prep, cleaning, laundry, driving, shopping, community activity, occupation, and yard work  PERSONAL FACTORS: Time since onset of injury/illness/exacerbation and 1-2 comorbidities: PMH  are also affecting patient's functional outcome.   REHAB POTENTIAL: Excellent  CLINICAL DECISION MAKING: Evolving/moderate complexity  EVALUATION COMPLEXITY: Moderate  PLAN:  PT FREQUENCY: 1-2x/week  PT DURATION: 6 weeks  PLANNED INTERVENTIONS: Therapeutic exercises, Therapeutic activity, Neuromuscular re-education, Balance training, Gait training, Patient/Family education, Self Care, Joint mobilization, Stair training, Vestibular training, Canalith repositioning, Orthotic/Fit training, DME instructions, Aquatic Therapy, Electrical stimulation, Cryotherapy, Moist heat, and Manual therapy  PLAN FOR NEXT SESSION: Berg Balance Test, corner balance activities, general LE strength. Use of TENS? Aquatic PT?   11:38 AM, 12/29/22 M. Shary Decamp, PT, DPT Physical Therapist- [place] Office Number: [phone]

## 2022-12-29 NOTE — Telephone Encounter (Signed)
I met with the patient when he was in the clinic today. He explained that due to his injury he is not able to work and his wife is working 2 jobs.  They subsequently lost their housing and are now staying with his cousin.   He is trying to find affordable housing but the process is very frustrating. He has contacted local social service agencies, including [employer] and they have not been able to offer any assistance. He was teary as he spoke about the fact that he and his wife were always able to provide for themselves but now they are not able to.  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  He has applied for disability and understands that there is an extensive waiting period to find out if he has been approved.   I offered to refer him to Managed Medicaid Care management to help him access any additional benefits he may have from his insurance company and he was in agreement, that referral was then placed. I instructed him to please call me with any questions.

## 2022-12-29 NOTE — Patient Instructions (Signed)
Chronic Pain, Adult Chronic pain is a type of pain that lasts or keeps coming back for at least 3-6 months. You may have headaches, pain in the abdomen, or pain in other areas of the body. Chronic pain may be related to an illness, injury, or a health condition. Sometimes, the cause of chronic pain is not known. Chronic pain can make it hard for you to do daily activities. If it is not treated, chronic pain can lead to anxiety and depression. Treatment depends on the cause of your pain and how severe it is. You may need to work with a pain specialist to come up with a treatment plan. Many people benefit from two or more types of treatment to control their pain. Follow these instructions at home: Treatment plan Follow your treatment plan as told by your health care provider. This may include: Gentle, regular exercise. Eating a healthy diet that includes foods such as vegetables, fruits, fish, and lean meats. Mental health therapy (cognitive or behavioral therapy) that changes the way you think or act in response to the pain. This may help improve how you feel. Doing physical therapy exercises to improve movement and strength. Meditation, yoga, acupuncture, or massage therapy. Using the oils from plants in your environment or on your skin (aromatherapy). Other treatments may include: Over-the-counter or prescription medicines. Color, light, or sound therapy. Local electrical stimulation. The electrical pulses help to relieve pain by temporarily stopping the nerve impulses that cause you to feel pain. Injections. These deliver numbing or pain-relieving medicines into the spine or the area of pain.  Medicines Take over-the-counter and prescription medicines only as told by your health care provider. Ask your health care provider if the medicine prescribed to you: Requires you to avoid driving or using machinery. Can cause constipation. You may need to take these actions to prevent or treat  constipation: Drink enough fluid to keep your urine pale yellow. Take over-the-counter or prescription medicines. Eat foods that are high in fiber, such as beans, whole grains, and fresh fruits and vegetables. Limit foods that are high in fat and processed sugars, such as fried or sweet foods. Lifestyle  Ask your health care provider whether you should keep a pain diary. Your health care provider will tell you what information to write in the diary. This may include: When you have pain. What the pain feels like. How medicines and other behaviors or treatments help to reduce the pain. Consider talking with a mental health care provider about how to help manage chronic pain. Consider joining a chronic pain support group. Try to control or lower your stress levels. Talk with your health care provider about ways to do this. General instructions Learn as much as you can about how to manage your chronic pain. Ask your health care provider if an intensive pain rehabilitation program or a chronic pain specialist would be helpful. Check your pain level as told by your health care provider. Ask your health care provider if you should use a pain scale. Contact a health care provider if: Your pain is not controlled with treatment. You have new pain. You have side effects from pain medicine. You feel weak or you have trouble doing your normal activities. You have trouble sleeping or you develop confusion. You lose feeling or have numbness in your body. You lose control of your bowels or bladder. Get help right away if: Your pain suddenly gets much worse. You develop chest pain. You have trouble breathing or shortness of  breath. You faint, or another person sees you faint. These symptoms may be an emergency. Get help right away. Call 911. Do not wait to see if the symptoms will go away. Do not drive yourself to the hospital. Also, get help right away if: You have thoughts about hurting yourself  or others. Take one of these steps if you feel like you may hurt yourself or others, or have thoughts about taking your own life: Go to your nearest emergency room. Call 911. Call the National Suicide Prevention Lifeline at 218-272-0754 or 988. This is open 24 hours a day. Text the Crisis Text Line at 480-214-3532. This information is not intended to replace advice given to you by your health care provider. Make sure you discuss any questions you have with your health care provider. Document Revised: 11/10/2021 Document Reviewed: 10/13/2021 Elsevier Patient Education  2024 [employer].

## 2022-12-29 NOTE — Progress Notes (Signed)
Subjective:  Patient ID: David [name], male    DOB: September 26, 1960  Age: 62 y.o. MRN: [id number]  CC: Hospitalization Follow-up (Nail fungus/Constipation)   HPI David [name] is a 62 y.o. 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 He had a complicated hospital course in 07/2022 involving MRSA bacteremia treated with multiple antibiotics with subsequent discharge to rehab.   Interval History: Discussed the use of AI scribe software for clinical note transcription with the patient, who gave verbal consent to proceed.   He presents with chronic back pain and hypersensitivity in the abdomen and leg. The patient reports that the back pain is internal and constant, and the hypersensitivity is skin-deep, particularly in the abdomen .  He is closely followed by neurosurgery where he was informed he does have neuropathy which will be chronic.  He also had a visit with infectious disease on 12/12/2022 and remains on suppressive treatment with doxycycline.   The patient also reports high blood sugar levels, despite taking 20 units of Lantus daily.  Random sugars get up to 316.  The patient expresses concern about his financial situation and housing, which is causing anxiety and depression. The patient is not currently working due to their health conditions and is in the process of applying for disability.  He discontinued BuSpar which has been ineffective for his anxiety and he was unable to tolerate Cymbalta due to nausea. He remains on Eliquis due to his A-fib and denies any chest pain or dyspnea.  He is yet to schedule an appointment with cardiology.  His chart reveals he is on Flomax and  finasteride which he states he was placed on during hospitalization when he developed urinary retention post Foley catheter removal. He complains of nail changes in his right thumb and index finger and associated brittle nails. Also complains of constipation.       Past Medical History:  Diagnosis Date   Cardiomyopathy (HCC)    a. EF 45% in 2019.   CHF (congestive heart failure) (HCC)    Chronic anticoagulation 09/30/2017   Diabetes mellitus type 2 in nonobese [facility])    Discitis of thoracic region 09/25/2022   Does not have health insurance    Essential hypertension 09/26/2017   Financial difficulties    H/O noncompliance with medical treatment, presenting hazards to health    Hardware complicating wound infection (HCC) 09/25/2022   Non-insulin treated type 2 diabetes mellitus (HCC) 09/26/2017   Persistent atrial fibrillation (HCC)    Sleep apnea suspected 09/30/2017    Past Surgical History:  Procedure Laterality Date   ABDOMINAL AORTOGRAM W/LOWER EXTREMITY N/A 09/02/2021   Procedure: ABDOMINAL AORTOGRAM W/LOWER EXTREMITY;  Surgeon: Nada Libman, MD;  Location: [facility];  Service: Cardiovascular;  Laterality: N/A;   ABDOMINAL AORTOGRAM W/LOWER EXTREMITY N/A 12/28/2021   Procedure: ABDOMINAL AORTOGRAM W/LOWER EXTREMITY;  Surgeon: Nada Libman, MD;  Location: [facility];  Service: Cardiovascular;  Laterality: N/A;   ABDOMINAL AORTOGRAM W/LOWER EXTREMITY N/A 07/28/2022   Procedure: ABDOMINAL AORTOGRAM W/LOWER EXTREMITY;  Surgeon: Cephus Shelling, MD;  Location: [facility];  Service: Cardiovascular;  Laterality: N/A;   AMPUTATION Right 09/04/2021   Procedure: 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;  Surgeon: [name],  Randa Evens, MD;  Location: [facility];  Service: Orthopedics;  Laterality: Right;   AMPUTATION Right 08/02/2022   Procedure: AMPUTATION RAY 5TH METATARSAL OF RIGHT FOOT;  Surgeon: [name], MD;  Location: [facility];  Service: Orthopedics;  Laterality: Right;    APPENDECTOMY  1971   BONE BIOPSY Right 08/02/2022   Procedure: BONE BIOPSY OF 5TH METATARSAL RIGHT FOOT;  Surgeon: [name], MD;  Location: [facility];  Service: Orthopedics;  Laterality: Right;   CARDIOVERSION N/A 09/28/2017   Procedure: CARDIOVERSION;  Surgeon: Thurmon Fair, MD;  [facility]: [facility];  Service: Cardiovascular;  Laterality: N/A;   IRRIGATION AND DEBRIDEMENT FOOT  08/02/2022   Procedure: IRRIGATION AND DEBRIDEMENT RIGHT 5TH METATARSAL FOOT;  Surgeon: [name], MD;  Location: [facility];  Service: Orthopedics;;   LAMINECTOMY WITH POSTERIOR LATERAL ARTHRODESIS LEVEL 4 N/A 08/30/2022   Procedure: THORACIC FIVE-THORACIC ELEVEN FUSION, THORACIC EIGHT TRANSPEDICULAR DECOMPRESSION AND PARTIAL CORPECTOMY with O-Arm;  Surgeon: Bedelia Person, MD;  Location: [facility] OR;  Service: Neurosurgery;  Laterality: N/A;   PERIPHERAL VASCULAR BALLOON ANGIOPLASTY  09/02/2021   Procedure: PERIPHERAL VASCULAR BALLOON ANGIOPLASTY;  Surgeon: Nada Libman, MD;  [facility];  Service: Cardiovascular;;   PERIPHERAL VASCULAR BALLOON ANGIOPLASTY  12/28/2021   Procedure: PERIPHERAL VASCULAR BALLOON ANGIOPLASTY;  Surgeon: Nada Libman, MD;  [facility];  Service: Cardiovascular;;  Posterior Tibial PTA only   TEE WITHOUT CARDIOVERSION N/A 09/28/2017   Procedure: TRANSESOPHAGEAL ECHOCARDIOGRAM (TEE);  Surgeon: Thurmon Fair, MD;  Location: [facility] ENDOSCOPY;  Service: Cardiovascular;  Laterality: N/A;   TEE WITHOUT CARDIOVERSION N/A 09/03/2021   Procedure: TRANSESOPHAGEAL ECHOCARDIOGRAM (TEE);  Surgeon: Chilton Si, MD;  Location: [facility] ENDOSCOPY;  Service: Cardiovascular;  Laterality: N/A;   TEE WITHOUT CARDIOVERSION N/A 07/15/2022   Procedure: TRANSESOPHAGEAL ECHOCARDIOGRAM;  Surgeon: Thurmon Fair, MD;  [facility];  Service: Cardiovascular;  Laterality: N/A;    Family History  Problem Relation Age of Onset   Hypertension Mother      Social History   Socioeconomic History   Marital status: Married    Spouse name: Not on file   Number of children: Not on file   Years of education: Not on file   Highest education level: Not on file  Occupational History   Not on file  Tobacco Use   Smoking status: Former    Current packs/day: 0.00    Types: Cigarettes    Start date: 03/19/2003    Quit date: 03/18/2018    Years since quitting: 4.7   Smokeless tobacco: Never   Tobacco comments:    09/26/2017 "2-3 cigarettes/month now"  Vaping Use   Vaping status: Never Used  Substance and Sexual Activity   Alcohol use: Not Currently    Alcohol/week: 3.0 standard drinks of alcohol    Types: 3 Cans of beer per week    Comment: States he quit drinking 8-9 months ago   Drug use: Never   Sexual activity: Not Currently  Other Topics Concern   Not on file  Social History Narrative   Not on file   Social Determinants of Health   Financial Resource Strain: Not on file  Food Insecurity: Patient Unable To Answer (08/23/2022)   Hunger Vital Sign    Worried About Running Out of Food in the Last Year: Patient unable to answer    Ran Out of Food in the Last Year: Patient unable to answer  Transportation Needs: Patient Unable To Answer (08/23/2022)  PRAPARE - Administrator, Civil Service (Medical): Patient unable to answer    Lack of Transportation (Non-Medical): Patient unable to answer  Physical Activity: Not on file  Stress: Stress Concern Present (09/26/2017)   [employer] of Occupational Health - Occupational Stress Questionnaire    Feeling of Stress : Very much  Social Connections: Not on file    No Known Allergies  Outpatient Medications Prior to Visit  Medication Sig Dispense Refill   Accu-Chek Softclix Lancets lancets Use to check blood sugar 3 times daily. 100 each 6   apixaban (ELIQUIS) 5 MG TABS tablet Take 1 tablet (5 mg total) by mouth 2 (two) times daily. 60 tablet 1   atorvastatin  (LIPITOR) 80 MG tablet Take 1 tablet (80 mg total) by mouth daily. 30 tablet 1   bisacodyl (DULCOLAX) 10 MG suppository Place 1 suppository (10 mg total) rectally daily as needed for moderate constipation. 12 suppository 0   Blood Glucose Monitoring Suppl (ACCU-CHEK GUIDE) w/Device KIT Use to check blood sugar 3 times daily. E11.9 1 kit 0   carvedilol (COREG) 3.125 MG tablet Take 1 tablet (3.125 mg total) by mouth 2 (two) times daily. 60 tablet 1   digoxin (LANOXIN) 0.125 MG tablet Take 1 tablet (125 mcg total) by mouth once daily. 30 tablet 1   doxycycline (VIBRA-TABS) 100 MG tablet Take 1 tablet (100 mg total) by mouth every 12 (twelve) hours. 180 tablet 3   famotidine (PEPCID) 20 MG tablet Take 1 tablet (20 mg total) by mouth 2 times daily. 60 tablet 1   ferrous sulfate 325 (65 FE) MG tablet Take 1 tablet (325 mg total) by mouth every other day. 30 tablet 1   finasteride (PROSCAR) 5 MG tablet Take 1 tablet (5 mg total) by mouth daily. 30 tablet 1   fluticasone (FLONASE) 50 MCG/ACT nasal spray Place 2 sprays into both nostrils daily. 16 g 2   furosemide (LASIX) 40 MG tablet Take 1/2 tablet (20 mg total) by mouth daily. 30 tablet 1   glucose blood (ACCU-CHEK GUIDE) test strip Use to check blood sugar 3 times daily. 100 each 6   HYDROcodone-acetaminophen (NORCO) 10-325 MG tablet Take 1 tablet by mouth every 6 (six) hours as needed for severe pain. 15 tablet 0   HYDROcodone-acetaminophen (NORCO) 10-325 MG tablet Take 1 tablet by mouth every 4 (four) hours as needed for pain 90 tablet 0   HYDROcodone-acetaminophen (NORCO) 10-325 MG tablet Take 1 tablet by mouth every 4 (four) hours as needed for pain 90 tablet 0   HYDROcodone-acetaminophen (NORCO) 10-325 MG tablet Take 1 tablet by mouth every 4 (four) hours as needed for pain. 90 tablet 0   HYDROcodone-acetaminophen (NORCO) 10-325 MG tablet Take 1 tablet by mouth every 4 (four) hours as needed for pain 30 tablet 0   Insulin Pen Needle (PEN NEEDLES)  32G X 4 MM MISC Use to inject insulin once daily. 100 each 6   magnesium oxide (MAG-OX) 400 (240 Mg) MG tablet Take 1 tablet (400 mg total) by mouth daily. 120 tablet 0   methocarbamol (ROBAXIN) 750 MG tablet Take 1 tablet (750 mg total) by mouth 3 (three) times daily as needed for muscle spasms 90 tablet 2   naloxone (NARCAN) nasal spray 4 mg/0.1 mL Opiates overdose 2 each 0   pregabalin (LYRICA) 75 MG capsule Take 1 capsule (75 mg total) by mouth at bedtime. 30 capsule 1   sacubitril-valsartan (ENTRESTO) 49-51 MG Take 1 tablet by mouth  2 (two) times daily. 60 tablet 1   tamsulosin (FLOMAX) 0.4 MG CAPS capsule Take 1 capsule (0.4 mg total) by mouth daily after supper. 30 capsule 1   insulin glargine (LANTUS SOLOSTAR) 100 UNIT/ML Solostar Pen Inject 20 Units into the skin daily. 15 mL 0   busPIRone (BUSPAR) 15 MG tablet Take 1 tablet (15 mg total) by mouth 2 (two) times daily. 60 tablet 1   DULoxetine (CYMBALTA) 20 MG capsule Take 1 capsule (20 mg total) by mouth daily. 30 capsule 1   feeding supplement (ENSURE ENLIVE / ENSURE PLUS) LIQD Take 237 mLs by mouth 2 (two) times daily between meals. 40000 mL 0   HYDROcodone-acetaminophen (NORCO) 10-325 MG tablet Take 1 tablet by mouth every 8 (eight) hours as needed for pain 90 tablet 0   lidocaine 4 % Place 1 patch onto the skin daily. 30 patch 0   liver oil-zinc oxide (DESITIN) 40 % ointment Apply topically 2 (two) times daily. 56.7 g 0   senna-docusate (SENOKOT-S) 8.6-50 MG tablet Take 1 tablet by mouth 2 (two) times daily. 30 tablet 0   polyethylene glycol powder (GLYCOLAX/MIRALAX) 17 GM/SCOOP powder Dissolve 1 capful (17 g) in water and drink 2 (two) times daily. 238 g 0   No facility-administered medications prior to visit.     ROS Review of Systems  Constitutional:  Negative for activity change and appetite change.  HENT:  Negative for sinus pressure and sore throat.   Respiratory:  Negative for chest tightness, shortness of breath and  wheezing.   Cardiovascular:  Negative for chest pain and palpitations.  Gastrointestinal:  Positive for constipation. Negative for abdominal distention and abdominal pain.  Genitourinary: Negative.   Musculoskeletal:  Positive for back pain.  Neurological:  Positive for numbness.  Psychiatric/Behavioral:  Negative for behavioral problems and dysphoric mood.     Objective:  BP (!) 144/75   Pulse 98   Ht 6' (1.829 m)   Wt 170 lb 3.2 oz (77.2 kg)   SpO2 97%   BMI 23.08 kg/m      12/29/2022    3:55 PM 12/12/2022    4:13 PM 10/31/2022    9:13 AM  BP/Weight  Systolic BP 144 124 160  Diastolic BP 75 78 95  Wt. (Lbs) 170.2 157 158.6  BMI 23.08 kg/m2 21.29 kg/m2 21.51 kg/m2      Physical Exam Constitutional:      Appearance: He is well-developed.  Cardiovascular:     Rate and Rhythm: Normal rate.     Heart sounds: Normal heart sounds. No murmur heard. Pulmonary:     Effort: Pulmonary effort is normal.     Breath sounds: Normal breath sounds. No wheezing or rales.  Chest:     Chest wall: No tenderness.  Abdominal:     General: Bowel sounds are normal. There is no distension.     Palpations: Abdomen is soft. There is no mass.     Tenderness: There is no abdominal tenderness.  Musculoskeletal:        General: Normal range of motion.     Right lower leg: No edema.     Left lower leg: Edema present.     Comments: Vertical surgical scar in thoracic region, TTP  Skin:    Comments: Dystrophic right thumb and index finger nails  Neurological:     Mental Status: He is alert and oriented to person, place, and time.  Psychiatric:        Mood and Affect: Mood  normal.        Latest Ref Rng & Units 12/12/2022    4:24 PM 10/28/2022    2:37 AM 10/27/2022    3:03 AM  CMP  Glucose 65 - 99 mg/dL 782  956  213   BUN 7 - 25 mg/dL 22  25  22    Creatinine 0.70 - 1.35 mg/dL 0.86  5.78  4.69   Sodium 135 - 146 mmol/L 134  134  130   Potassium 3.5 - 5.3 mmol/L 4.2  4.6  4.0   Chloride 98  - 110 mmol/L 100  99  99   CO2 20 - 32 mmol/L 24  26  22    Calcium 8.6 - 10.3 mg/dL 9.8  9.7  9.3   Total Protein 6.1 - 8.1 g/dL 6.7     Total Bilirubin 0.2 - 1.2 mg/dL 1.4     AST 10 - 35 U/L 13     ALT 9 - 46 U/L 11       Lipid Panel     Component Value Date/Time   CHOL 213 (H) 12/14/2021 1025   TRIG 191 (H) 12/14/2021 1025   HDL 36 (L) 12/14/2021 1025   CHOLHDL 5.9 (H) 12/14/2021 1025   CHOLHDL 6.6 09/03/2021 0314   VLDL 50 (H) 09/03/2021 0314   LDLCALC 142 (H) 12/14/2021 1025    CBC    Component Value Date/Time   WBC 6.6 12/12/2022 1624   RBC 4.84 12/12/2022 1624   HGB 14.7 12/12/2022 1624   HGB 17.3 06/16/2021 1658   HCT 43.6 12/12/2022 1624   HCT 47.0 06/16/2021 1658   PLT 222 12/12/2022 1624   PLT 244 06/16/2021 1658   MCV 90.1 12/12/2022 1624   MCV 91 06/16/2021 1658   MCH 30.4 12/12/2022 1624   MCHC 33.7 12/12/2022 1624   RDW 14.4 12/12/2022 1624   RDW 12.7 06/16/2021 1658   LYMPHSABS 739 (L) 12/12/2022 1624   LYMPHSABS 1.7 11/16/2018 1128   MONOABS 0.9 10/22/2022 0204   EOSABS 152 12/12/2022 1624   EOSABS 0.4 11/16/2018 1128   BASOSABS 73 12/12/2022 1624   BASOSABS 0.1 11/16/2018 1128    Lab Results  Component Value Date   HGBA1C 5.5 10/08/2022    Assessment & Plan:     Osteomyelitis/discitis of thoracic spine History of MRSA Post-operative pain from spinal surgery Chronic pain due to nerve damage and hardware placement following surgery for osteomyelitis of the thoracic spine. Pain is managed by a spine specialist with hydrocodone. -Continue current pain management plan with spine specialist. -Continue with Lyrica -Continue suppressive treatment with doxycycline -Keep appointment with infectious disease -Trial of Cymbalta  Type II Diabetes Mellitus Recent hyperglycemia with blood glucose levels reaching 316 and consistently above 200. Last A1c was 5.5 in July 2024. -Increase Lantus from 20 units to 25 units daily; advised to increase by 2  units every week until blood sugars are at goal. -Check A1c in two months. -Counseled on Diabetic diet, my plate method, 629 minutes of moderate intensity exercise/week Blood sugar logs with fasting goals of 80-120 mg/dl, random of less than 528 and in the event of sugars less than 60 mg/dl or greater than 413 mg/dl encouraged to notify the clinic. Advised on the need for annual eye exams, annual foot exams, Pneumonia vaccine.   Constipation Likely secondary to opioid use. -Refill Miralax prescription.  Onychomycosis Recent onset affecting multiple fingernails. -Prescribe terbinafine once daily for three months. -Check liver enzymes when patient comes in  for fasting blood work.  Anxiety and Depression Patient reports significant anxiety and depression, likely exacerbated by chronic pain and disability. Previous trial of Buspar was ineffective. Patient has concerns about side effects from Cymbalta - nausea  -Restart Cymbalta and monitor for side effects. -Consider alternative treatments if patient cannot tolerate Cymbalta. -I had a case manager meet with him to discuss housing resources and other support options available for him.  Atrial Fibrillation Patient is on chronic anticoagulation with Eliquis.  -Encourage patient to make appointment with cardiologist.  Hyperlipidemia Last cholesterol check was in 2023. -Order fasting lipid panel for October 7th, 2024.  CHF with improved EF Euvolemic with EF of 60 to 65% from 07/2022 -Continue Entresto, Coreg, beta-blocker Continue current medications  Follow-up in two months to recheck A1c and discuss response to increased Lantus and restarted Cymbalta.          Meds ordered this encounter  Medications   polyethylene glycol powder (GLYCOLAX/MIRALAX) 17 GM/SCOOP powder    Sig: Take 17 g by mouth daily.    Dispense:  510 g    Refill:  3   insulin glargine (LANTUS SOLOSTAR) 100 UNIT/ML Solostar Pen    Sig: Inject 25 Units into the  skin daily.    Dispense:  30 mL    Refill:  6    per dr Blake Divine, ok to change to lantus   terbinafine (LAMISIL) 250 MG tablet    Sig: Take 1 tablet (250 mg total) by mouth daily.    Dispense:  90 tablet    Refill:  0    Follow-up: Return in about 2 months (around 02/28/2023) for Chronic medical conditions.    Visit required 64 minutes of patient care including median intraservice time, reviewing previous notes and test results, coordination of care with the case manager, counseling the patient on diabetes management and adjustment of Lantus in addition to management of chronic medical conditions.Time also spent ordering medications, investigations and documenting in the chart.  All questions were answered to the patient's satisfaction    [name], MD, FAAFP. [facility] [place], [place] [phone]   12/30/2022, 12:29 PM

## 2022-12-30 ENCOUNTER — Encounter: Payer: Self-pay | Admitting: Family Medicine

## 2022-12-30 ENCOUNTER — Other Ambulatory Visit (HOSPITAL_COMMUNITY): Payer: Self-pay

## 2022-12-30 ENCOUNTER — Other Ambulatory Visit: Payer: Self-pay

## 2022-12-30 MED ORDER — HYDROCODONE-ACETAMINOPHEN 10-325 MG PO TABS
1.0000 | ORAL_TABLET | ORAL | 0 refills | Status: DC | PRN
Start: 2022-12-30 — End: 2023-05-02
  Filled 2022-12-30: qty 30, 5d supply, fill #0

## 2022-12-30 MED ORDER — HYDROCODONE-ACETAMINOPHEN 10-325 MG PO TABS
1.0000 | ORAL_TABLET | ORAL | 0 refills | Status: DC | PRN
Start: 2022-12-30 — End: 2022-12-30
  Filled 2022-12-30: qty 30, 5d supply, fill #0

## 2023-01-02 ENCOUNTER — Other Ambulatory Visit: Payer: Self-pay

## 2023-01-02 ENCOUNTER — Other Ambulatory Visit (HOSPITAL_COMMUNITY): Payer: Self-pay

## 2023-01-03 ENCOUNTER — Ambulatory Visit: Payer: Medicaid Other | Admitting: Physical Therapy

## 2023-01-04 ENCOUNTER — Other Ambulatory Visit (HOSPITAL_COMMUNITY): Payer: Self-pay

## 2023-01-05 ENCOUNTER — Ambulatory Visit: Payer: Medicaid Other | Attending: Family

## 2023-01-05 DIAGNOSIS — R2689 Other abnormalities of gait and mobility: Secondary | ICD-10-CM

## 2023-01-05 DIAGNOSIS — R262 Difficulty in walking, not elsewhere classified: Secondary | ICD-10-CM | POA: Diagnosis present

## 2023-01-05 DIAGNOSIS — M6281 Muscle weakness (generalized): Secondary | ICD-10-CM | POA: Diagnosis present

## 2023-01-05 DIAGNOSIS — R2681 Unsteadiness on feet: Secondary | ICD-10-CM

## 2023-01-05 NOTE — Therapy (Signed)
OUTPATIENT PHYSICAL THERAPY NEURO TREATMENT   Patient Name: David [name] MRN: [id number] DOB:October 13, 1960, 62 y.o., male Today's Date: 01/05/2023   PCP: [name], MD REFERRING PROVIDER: Eleonore Chiquito, FNP  END OF SESSION:  PT End of Session - 01/05/23 1148     Visit Number 5    Number of Visits 12    Date for PT Re-Evaluation 01/25/23    Authorization Type Medicaid-[employer]    Authorization - Visit Number 5    Authorization - Number of Visits 27    PT Start Time 1145    PT Stop Time 1230    PT Time Calculation (min) 45 min             Past Medical History:  Diagnosis Date   Cardiomyopathy (HCC)    a. EF 45% in 2019.   CHF (congestive heart failure) (HCC)    Chronic anticoagulation 09/30/2017   Diabetes mellitus type 2 in nonobese [facility])    Discitis of thoracic region 09/25/2022   Does not have health insurance    Essential hypertension 09/26/2017   Financial difficulties    H/O noncompliance with medical treatment, presenting hazards to health    Hardware complicating wound infection (HCC) 09/25/2022   Non-insulin treated type 2 diabetes mellitus (HCC) 09/26/2017   Persistent atrial fibrillation (HCC)    Sleep apnea suspected 09/30/2017   Past Surgical History:  Procedure Laterality Date   ABDOMINAL AORTOGRAM W/LOWER EXTREMITY N/A 09/02/2021   Procedure: ABDOMINAL AORTOGRAM W/LOWER EXTREMITY;  Surgeon: Nada Libman, MD;  [facility]: [facility];  Service: Cardiovascular;  Laterality: N/A;   ABDOMINAL AORTOGRAM W/LOWER EXTREMITY N/A 12/28/2021   Procedure: ABDOMINAL AORTOGRAM W/LOWER EXTREMITY;  Surgeon: Nada Libman, MD;  Location: [facility];  Service: Cardiovascular;  Laterality: N/A;   ABDOMINAL AORTOGRAM W/LOWER EXTREMITY N/A 07/28/2022   Procedure: ABDOMINAL AORTOGRAM W/LOWER EXTREMITY;  Surgeon: Cephus Shelling, MD;  Location: [facility];  Service: Cardiovascular;  Laterality: N/A;   AMPUTATION Right 09/04/2021    Procedure: 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;  Surgeon: Nadara Mustard, MD;  Location: [facility] OR;  Service: Orthopedics;  Laterality: Right;   AMPUTATION Right 08/02/2022   Procedure: AMPUTATION RAY 5TH METATARSAL OF RIGHT FOOT;  Surgeon: [name], MD;  Location: [facility] OR;  Service: Orthopedics;  Laterality: Right;   APPENDECTOMY  1971   BONE BIOPSY Right 08/02/2022   Procedure: BONE BIOPSY OF 5TH METATARSAL RIGHT FOOT;  Surgeon: [name], MD;  Location: [facility] OR;  Service: Orthopedics;  Laterality: Right;   CARDIOVERSION N/A 09/28/2017   Procedure: CARDIOVERSION;  Surgeon: Thurmon Fair, MD;  Location: [facility]OS[facility];  Service: Cardiovascular;  Laterality: N/A;   IRRIGATION AND DEBRIDEMENT FOOT  08/02/2022   Procedure: IRRIGATION AND DEBRIDEMENT RIGHT 5TH METATARSAL FOOT;  Surgeon: [name], MD;  Location: [facility] OR;  Service: Orthopedics;;   LAMINECTOMY WITH POSTERIOR LATERAL ARTHRODESIS LEVEL 4 N/A 08/30/2022   Procedure: THORACIC FIVE-THORACIC ELEVEN FUSION, THORACIC EIGHT TRANSPEDICULAR DECOMPRESSION AND PARTIAL CORPECTOMY with O-Arm;  Surgeon: Bedelia Person, MD;  Location: [facility] OR;  Service: Neurosurgery;  Laterality: N/A;   PERIPHERAL VASCULAR BALLOON ANGIOPLASTY  09/02/2021   Procedure: PERIPHERAL VASCULAR BALLOON ANGIOPLASTY;  Surgeon: Nada Libman, MD;  [facility];  Service: Cardiovascular;;   PERIPHERAL VASCULAR BALLOON ANGIOPLASTY  12/28/2021   Procedure: PERIPHERAL VASCULAR BALLOON ANGIOPLASTY;  Surgeon: Nada Libman, MD;  [facility];  Service: Cardiovascular;;  Posterior Tibial PTA only  TEE WITHOUT CARDIOVERSION N/A 09/28/2017   Procedure: TRANSESOPHAGEAL ECHOCARDIOGRAM (TEE);  Surgeon: Thurmon Fair, MD;  Location: [facility] ENDOSCOPY;  Service: Cardiovascular;  Laterality: N/A;   TEE WITHOUT CARDIOVERSION N/A 09/03/2021   Procedure: TRANSESOPHAGEAL ECHOCARDIOGRAM (TEE);  Surgeon: Chilton Si, MD;  Location: [facility] ENDOSCOPY;  Service:  Cardiovascular;  Laterality: N/A;   TEE WITHOUT CARDIOVERSION N/A 07/15/2022   Procedure: TRANSESOPHAGEAL ECHOCARDIOGRAM;  Surgeon: Thurmon Fair, MD;  [facility];  Service: Cardiovascular;  Laterality: N/A;   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 08/08/2022   MRSA bacteremia 08/08/2022   AKI (acute kidney injury), ruled out 07/27/2022   Discitis thoracic region 07/27/2022   Bacteremia due to methicillin resistant Staphylococcus aureus 07/27/2022   Severe protein-calorie malnutrition (HCC) 07/26/2022   PVD (peripheral vascular disease) (HCC) 07/26/2022   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   Bacteremia 07/15/2022   Acute respiratory failure with hypoxia (HCC) 07/12/2022   Atelectasis 07/12/2022   Sepsis due to pneumonia (HCC) 07/12/2022   Gangrene of toe of right foot (HCC)    Sepsis (HCC) 08/29/2021   SIRS (systemic inflammatory response syndrome) (HCC) 08/29/2021   Hyponatremia 08/29/2021   Hypomagnesemia 08/29/2021   Pure hypercholesterolemia 06/17/2021   Pain of left hand 09/06/2019   Cellulitis 08/26/2019   Persistent atrial fibrillation (HCC) 08/26/2019   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) 10/18/2018   Transaminitis 10/17/2018   Hyperbilirubinemia 10/17/2018   Leukocytosis 10/17/2018   Sinusitis 10/17/2018   Chronic anticoagulation 09/30/2017   Smoker  09/30/2017   Cardiomyopathy (HCC) 09/30/2017   Sleep apnea suspected 09/30/2017   Essential hypertension 09/26/2017   Uncontrolled type 2 diabetes with hyperglycemia and hypoglycemia, insulin long term use 09/26/2017   Atrial fibrillation with RVR (HCC) 09/26/2017    ONSET DATE: 08/20/22  REFERRING DIAG: M46.44ICD-10-CMDiscitis of thoracic region  THERAPY DIAG:  Muscle weakness (generalized)  Difficulty in walking, not elsewhere classified  Unsteadiness on feet  Other abnormalities of gait and mobility  Rationale for Evaluation and Treatment: Rehabilitation  SUBJECTIVE:  SUBJECTIVE STATEMENT: Was prescribed some medication that didn't agree. Have since stopped. Pt accompanied by: significant other  PERTINENT HISTORY:  Per patient, he was admitted to the local hospital on Aug 20, 2022 and had back surgery thereafter. Patient was then readmitted after being discharged for infection with low BG. He was discharged on October 28, 2022 on oral antibiotics with changes to his insulin regimen. past medical history of PVD, type 2 diabetes with hyperglycemia/hypoglycemia, seizure disorders, hypertension, hyperlipidemia, A-fib (on Eliquis) and recent bacteremia.  He is s/p angioplasty.  Patient reports since discharge he has been doing physical therapy/exercises at home but continues to have severe back pain    PAIN:  Are you having pain? Yes: NPRS scale: 7/10 Pain location: mid back and radiating Pain description: stabbing Aggravating factors: activity and with rest Relieving factors: pain medication  PRECAUTIONS: Back  RED FLAGS:    WEIGHT BEARING RESTRICTIONS: No  FALLS: Has patient fallen in last 6 months? No  LIVING ENVIRONMENT: Lives with: lives with their spouse Lives in: House/apartment,  pt reports housing issues since out of work Stairs: No Has following equipment at home: Environmental consultant - 2 wheeled  PLOF: Independent, previously worked as Photographer  PATIENT GOALS: be able to return to normal, walking without AD  OBJECTIVE:   TODAY'S TREATMENT: 01/05/23 Activity Comments  TENS unit applied -pt demo teachback for amplitude  Berg balance 38/56  Sidestepping x 2 min Parallel bars, UE support required  Standing on foam -EO x 60 sec -EC 6x10 sec bouts  On firm -feet together EO head turns 3x -semi-tandem EO head turns 3x  Gastroc stretch 2x60 slantboard        PATIENT EDUCATION: Education details: assessment details, rationale of PT intervention Person educated: Patient Education method: Explanation Education comprehension: verbalized understanding  HOME EXERCISE PROGRAM: Access Code: [email or web address] URL: [email or web address] Date: 12/23/2022 Prepared by: Shary Decamp  Exercises - Seated Knee Extension with Resistance  - 1 x daily - 7 x weekly - 3 sets - 10 reps - Seated Hamstring Curls with Resistance  - 1 x daily - 7 x weekly - 3 sets - 10 reps - Seated Hip Abduction with Resistance  - 1 x daily - 7 x weekly - 3 sets - 10 reps - Seated Hip Adduction Isometrics with Ball  - 1 x daily - 7 x weekly - 3 sets - 10 reps - Hooklying Clamshell with Resistance  - 1 x daily - 7 x weekly - 3 sets - 10 reps - Seated Shoulder Horizontal Abduction with Resistance  - 1 x daily - 7 x weekly - 3 sets - 10 reps - Seated Shoulder Row with Resistance Anchored at Feet  - 1 x daily - 7 x weekly - 3 sets - 10 reps  DIAGNOSTIC FINDINGS: IMPRESSION: 1. Significantly regressed bilateral pleural effusions and improved bilateral ventilation since last month. 2. No new cardiopulmonary abnormality  COGNITION: Overall cognitive status: Within functional limits for tasks assessed   SENSATION: Proprioception: Impaired  Decreased LLE > RLE COORDINATION: Minimal impairment  LLE  EDEMA:  none  MUSCLE TONE: NT      POSTURE: No Significant postural limitations  LOWER EXTREMITY ROM:     Active  Right Eval Left Eval  Hip flexion    Hip extension    Hip abduction    Hip adduction    Hip internal rotation    Hip external rotation    Knee flexion 120 120  Knee extension 0 0  Ankle dorsiflexion 15 10  Ankle plantarflexion    Ankle inversion    Ankle eversion     (Blank rows = not tested)  LOWER EXTREMITY MMT:    MMT Right Eval Left Eval  Hip flexion 3+ 3+  Hip extension    Hip abduction 3+ 3+  Hip adduction 4 4  Hip internal rotation    Hip external rotation    Knee flexion 4+ 4  Knee extension 5 4-  Ankle dorsiflexion 4+ 3+  Ankle plantarflexion 3+ 3+  Ankle inversion    Ankle eversion    (Blank rows = not tested)  BED MOBILITY:  Modified indep due to back precautions  TRANSFERS: Assistive device utilized: Environmental consultant - 2 wheeled  Sit to stand: Modified independence Stand to sit: Modified independence Chair to chair: Modified independence Floor:  NT    CURB:  Level of Assistance: SBA Assistive device utilized: Environmental consultant - 2 wheeled Curb Comments:   STAIRS: Level of Assistance: SBA Stair Negotiation Technique: Step to Pattern Alternating Pattern  with Bilateral Rails Number of Stairs: 8  Height of Stairs: 4-6"  Comments:   GAIT: Gait pattern: Left foot flat Distance walked:  Assistive device utilized: Walker - 2 wheeled Level of assistance: Modified independence Comments: ambulates with good stride, decreased speed. Without AD ambulates with past retracts  FUNCTIONAL TESTS:  5 times sit to stand: 25 sec w/ BUE Timed up and go (TUG): 19 sec  M-CTSIB  Condition 1: Firm Surface, EO 30 Sec, Moderate Sway  Condition 2: Firm Surface, EC 10 Sec,  left LOB  Sway  Condition 3: Foam Surface, EO  Sec,  Sway  Condition 4: Foam Surface, EC  Sec,  Sway         GOALS: Goals reviewed with patient? Yes  SHORT TERM  GOALS: Target date: 01/11/2023    Patient will be independent in HEP to improve functional outcomes Baseline: Goal status: IN PROGRESS  2.  Patient will achieve 12 seconds for TUG test to manifest reduced risk for falls Baseline: 19 w/ RW Goal status: IN PROGRESS  3.  Demo improved BLE strength and reduced risk for falls per time 15 sec 5xSTS test Baseline: 25 sec w/ BUE push-off Goal status: IN PROGRESS  4. Demo modified independent stair ambulation with reciprocal pattern and single HR to improve community mobility  Baseline: SBA, BHR  Goal status: IN PROGRESS  LONG TERM GOALS: Target date: 01/25/2023    Demo low risk for falls per score 50/56 Berg Balance Test Baseline: 38/56 Goal status: IN PROGRESS  2.  Demo independent ambulation level surfaces w/out AD x 150 ft to improve independence and safety with household ambulation Baseline: dependent on RW Goal status: INITIAL  3.  Demo gross BLE strength to 4+/5 to improve activity tolerance and stability for unsupported standing/gait Baseline:  Goal status: INITIAL    ASSESSMENT:  CLINICAL IMPRESSION: Pt notes ongoing back pain and bad reaction from new medication that has caused somnolence and decreased energy.  Application of TENS electrodes due to placement and able to teach-back settings and reports pain to 5/10 post-tx.  Berg Balance Test performed with score of 38/56 indicating high risk for falls with most evident deficits being narrow BOS, single limb support, and negotiating turns.  Continued with tx activities to improve unsupported standing and techniques to facilitate ankle strategy to improve stability and safety with ADL performance.  Continued sessions indicated to progress POC details and improve mobility and progress gait training to  least restrictive AD  OBJECTIVE IMPAIRMENTS: Abnormal gait, cardiopulmonary status limiting activity, decreased activity tolerance, decreased balance, decreased coordination,  decreased endurance, decreased knowledge of use of DME, difficulty walking, decreased strength, impaired perceived functional ability, and pain.   ACTIVITY LIMITATIONS: carrying, lifting, bending, standing, squatting, stairs, transfers, bathing, dressing, reach over head, and locomotion level  PARTICIPATION LIMITATIONS: meal prep, cleaning, laundry, driving, shopping, community activity, occupation, and yard work  PERSONAL FACTORS: Time since onset of injury/illness/exacerbation and 1-2 comorbidities: PMH  are also affecting patient's functional outcome.   REHAB POTENTIAL: Excellent  CLINICAL DECISION MAKING: Evolving/moderate complexity  EVALUATION COMPLEXITY: Moderate  PLAN:  PT FREQUENCY: 1-2x/week  PT DURATION: 6 weeks  PLANNED INTERVENTIONS: Therapeutic exercises, Therapeutic activity, Neuromuscular re-education, Balance training, Gait training, Patient/Family education, Self Care, Joint mobilization, Stair training, Vestibular training, Canalith repositioning, Orthotic/Fit training, DME instructions, Aquatic Therapy, Electrical stimulation, Cryotherapy, Moist heat, and Manual therapy  PLAN FOR NEXT SESSION:  corner balance activities, general LE strength.    11:48 AM, 01/05/23 M. Shary Decamp, PT, DPT Physical Therapist- [place] Office Number: [phone]

## 2023-01-09 ENCOUNTER — Other Ambulatory Visit: Payer: Medicaid Other

## 2023-01-09 ENCOUNTER — Ambulatory Visit: Payer: Medicaid Other | Attending: Family Medicine

## 2023-01-09 DIAGNOSIS — I739 Peripheral vascular disease, unspecified: Secondary | ICD-10-CM

## 2023-01-09 NOTE — Patient Instructions (Signed)
Visit Information  Mr. Vester was given information about Medicaid Managed Care team care coordination services as a part of their [employer] Medicaid benefit. [name] verbally consentedto engagement with the [facility] Managed Care team.   If you are experiencing a medical emergency, please call 911 or report to your local emergency department or urgent care.   If you have a non-emergency medical problem during routine business hours, please contact your provider's office and ask to speak with a nurse.   For questions related to your [employer] [facility] health plan, please call: [phone]  OR visit the member homepage at: [email or web address]  If you would like to schedule transportation through your [facility] plan, please call the following number at least 2 days in advance of your appointment: [phone]  If you are experiencing a behavioral health crisis, call the [employer] [facility] Crisis Line at [phone]). The line is available 24 hours a day, seven days a week.  If you would like help to quit smoking, call 1-800-QUIT-NOW (419-406-9786) OR Espaol: 1-855-Djelo-Ya (6-644-034-7425) o para ms informacin haga clic aqu or Text READY to 956-387 to register via text   Mr. Frankl - following are the goals we discussed in your visit today:   Goals Addressed   None      Social Worker will follow up on 02/10/23.   Gus Puma, Kenard Gower, [facility]  Managed Medicaid Social Worker [phone]   Following is a copy of your plan of care:  There are no care plans that you recently modified to display for this patient.

## 2023-01-09 NOTE — Patient Outreach (Signed)
Medicaid Managed Care Social Work Note  01/09/2023 Name:  David [name] MRN:  [id number] DOB:  07-27-1960  David [name] is an 62 y.o. year old male who is a primary patient of [name], MD.  The Medicaid Managed Care Coordination team was consulted for assistance with:  Community Resources   [name] was given information about Medicaid Managed Care Coordination team services today. [name] Patient agreed to services and verbal consent obtained.  Engaged with patient  for by telephone forinitial visit in response to referral for case management and/or care coordination services.   Assessments/Interventions:  Review of past medical history, allergies, medications, health status, including review of consultants reports, laboratory and other test data, was performed as part of comprehensive evaluation and provision of chronic care management services.  SDOH: (Social Determinant of Health) assessments and interventions performed: SDOH Interventions    Flowsheet Row Telephone from 09/09/2021 in [facility]  SDOH Interventions   Transportation Interventions Patient Resources (Friends/Family)     BSW completed a telephone outreach with patient, he states he is disabled and can no longer work. He and his wife are staying with family members and wife is working 2 jobs to save money for their own place. Patient states he has tried all of the affordable housing options and has spent about $400 on deposits. Patient states he has also tried all social agencies in the area. He does receive foodstamps. Patient has applied for disability and understands the process is about 8 months to 1 year. Patient has tried all resources BSW will offer.  Advanced Directives Status:  Not addressed in this encounter.  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   Adjustment disorder with mixed anxiety and depressed mood 08/13/2022   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 08/08/2022   MRSA bacteremia 08/08/2022   AKI (acute kidney injury), ruled out 07/27/2022   Discitis thoracic region 07/27/2022   Bacteremia due to methicillin resistant Staphylococcus aureus 07/27/2022   Severe protein-calorie malnutrition (HCC) 07/26/2022   PVD (peripheral vascular disease) (HCC) 07/26/2022   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   Bacteremia 07/15/2022   Acute respiratory failure with hypoxia (HCC) 07/12/2022   Atelectasis 07/12/2022   Sepsis due to pneumonia (HCC) 07/12/2022   Gangrene of toe of right foot (HCC)    Sepsis (HCC) 08/29/2021   SIRS (systemic inflammatory response syndrome) (HCC) 08/29/2021   Hyponatremia 08/29/2021   Hypomagnesemia 08/29/2021   Pure hypercholesterolemia 06/17/2021   Pain of left hand 09/06/2019   Cellulitis 08/26/2019   Persistent atrial fibrillation (HCC) 08/26/2019   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   Follow up:  Patient agrees to Care Plan and Follow-up.  Plan: The Managed Medicaid care management team will reach out to the patient again over the next 30-45 days.  Date/time of next scheduled Social Work care management/care coordination outreach:  02/10/23  Gus Puma, Kenard Gower, [facility]  Managed [facility] Social Worker [phone]

## 2023-01-10 ENCOUNTER — Ambulatory Visit: Payer: Medicaid Other | Admitting: Physical Therapy

## 2023-01-10 ENCOUNTER — Other Ambulatory Visit (HOSPITAL_COMMUNITY): Payer: Self-pay

## 2023-01-10 ENCOUNTER — Encounter: Payer: Self-pay | Admitting: Physical Therapy

## 2023-01-10 ENCOUNTER — Other Ambulatory Visit: Payer: Self-pay | Admitting: Family Medicine

## 2023-01-10 DIAGNOSIS — R2681 Unsteadiness on feet: Secondary | ICD-10-CM

## 2023-01-10 DIAGNOSIS — M6281 Muscle weakness (generalized): Secondary | ICD-10-CM | POA: Diagnosis not present

## 2023-01-10 DIAGNOSIS — R262 Difficulty in walking, not elsewhere classified: Secondary | ICD-10-CM

## 2023-01-10 LAB — CMP14+EGFR
ALT: 13 [IU]/L (ref 0–44)
AST: 16 [IU]/L (ref 0–40)
Albumin: 4.2 g/dL (ref 3.9–4.9)
Alkaline Phosphatase: 116 [IU]/L (ref 44–121)
BUN/Creatinine Ratio: 16 (ref 10–24)
BUN: 21 mg/dL (ref 8–27)
Bilirubin Total: 1.4 mg/dL — ABNORMAL HIGH (ref 0.0–1.2)
CO2: 21 mmol/L (ref 20–29)
Calcium: 10.2 mg/dL (ref 8.6–10.2)
Chloride: 100 mmol/L (ref 96–106)
Creatinine, Ser: 1.28 mg/dL — ABNORMAL HIGH (ref 0.76–1.27)
Globulin, Total: 2.6 g/dL (ref 1.5–4.5)
Glucose: 161 mg/dL — ABNORMAL HIGH (ref 70–99)
Potassium: 5.3 mmol/L — ABNORMAL HIGH (ref 3.5–5.2)
Sodium: 136 mmol/L (ref 134–144)
Total Protein: 6.8 g/dL (ref 6.0–8.5)
eGFR: 64 mL/min/{1.73_m2} (ref >59)

## 2023-01-10 LAB — LP+NON-HDL CHOLESTEROL
Cholesterol, Total: 199 mg/dL (ref 100–199)
HDL: 36 mg/dL — ABNORMAL LOW
LDL Chol Calc (NIH): 121 mg/dL — ABNORMAL HIGH (ref 0–99)
Total Non-HDL-Chol (LDL+VLDL): 163 mg/dL — ABNORMAL HIGH (ref 0–129)
Triglycerides: 235 mg/dL — ABNORMAL HIGH (ref 0–149)
VLDL Cholesterol Cal: 42 mg/dL — ABNORMAL HIGH (ref 5–40)

## 2023-01-10 MED ORDER — ROSUVASTATIN CALCIUM 20 MG PO TABS
20.0000 mg | ORAL_TABLET | Freq: Every day | ORAL | 1 refills | Status: DC
Start: 2023-01-10 — End: 2023-06-30
  Filled 2023-01-10: qty 90, 90d supply, fill #0
  Filled 2023-06-26: qty 90, 90d supply, fill #1

## 2023-01-10 NOTE — Therapy (Signed)
OUTPATIENT PHYSICAL THERAPY NEURO TREATMENT   Patient Name: David [name] MRN: [id number] DOB:1960/09/04, 61 y.o., male Today's Date: 01/10/2023   PCP: [name], MD REFERRING PROVIDER: Eleonore Chiquito, FNP  END OF SESSION:  PT End of Session - 01/10/23 1059     Visit Number 6    Number of Visits 12    Date for PT Re-Evaluation 01/25/23    Authorization Type [employer]    Authorization - Visit Number 6    Authorization - Number of Visits 27    PT Start Time 1101    PT Stop Time 1142    PT Time Calculation (min) 41 min    Equipment Utilized During Treatment Gait belt   pt's back brace   Activity Tolerance Patient tolerated treatment well    Behavior During Therapy WFL for tasks assessed/performed              Past Medical History:  Diagnosis Date   Cardiomyopathy (HCC)    a. EF 45% in 2019.   CHF (congestive heart failure) (HCC)    Chronic anticoagulation 09/30/2017   Diabetes mellitus type 2 in nonobese [facility])    Discitis of thoracic region 09/25/2022   Does not have health insurance    Essential hypertension 09/26/2017   Financial difficulties    H/O noncompliance with medical treatment, presenting hazards to health    Hardware complicating wound infection (HCC) 09/25/2022   Non-insulin treated type 2 diabetes mellitus (HCC) 09/26/2017   Persistent atrial fibrillation (HCC)    Sleep apnea suspected 09/30/2017   Past Surgical History:  Procedure Laterality Date   ABDOMINAL AORTOGRAM W/LOWER EXTREMITY N/A 09/02/2021   Procedure: ABDOMINAL AORTOGRAM W/LOWER EXTREMITY;  Surgeon: Nada Libman, MD;  Location: [facility];  Service: Cardiovascular;  Laterality: N/A;   ABDOMINAL AORTOGRAM W/LOWER EXTREMITY N/A 12/28/2021   Procedure: ABDOMINAL AORTOGRAM W/LOWER EXTREMITY;  Surgeon: Nada Libman, MD;  Location: [facility];  Service: Cardiovascular;  Laterality: N/A;   ABDOMINAL AORTOGRAM W/LOWER EXTREMITY N/A 07/28/2022    Procedure: ABDOMINAL AORTOGRAM W/LOWER EXTREMITY;  Surgeon: Cephus Shelling, MD;  Location: [facility];  Service: Cardiovascular;  Laterality: N/A;   AMPUTATION Right 09/04/2021   Procedure: 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;  Surgeon: Nadara Mustard, MD;  Location: [facility] OR;  Service: Orthopedics;  Laterality: Right;   AMPUTATION Right 08/02/2022   Procedure: AMPUTATION RAY 5TH METATARSAL OF RIGHT FOOT;  Surgeon: [name], MD;  Location: [facility] OR;  Service: Orthopedics;  Laterality: Right;   APPENDECTOMY  1971   BONE BIOPSY Right 08/02/2022   Procedure: BONE BIOPSY OF 5TH METATARSAL RIGHT FOOT;  Surgeon: [name], MD;  Location: [facility] OR;  Service: Orthopedics;  Laterality: Right;   CARDIOVERSION N/A 09/28/2017   Procedure: CARDIOVERSION;  Surgeon: Thurmon Fair, MD;  Location: [facility];  Service: Cardiovascular;  Laterality: N/A;   IRRIGATION AND DEBRIDEMENT FOOT  08/02/2022   Procedure: IRRIGATION AND DEBRIDEMENT RIGHT 5TH METATARSAL FOOT;  Surgeon: [name], MD;  Location: [facility] OR;  Service: Orthopedics;;   LAMINECTOMY WITH POSTERIOR LATERAL ARTHRODESIS LEVEL 4 N/A 08/30/2022   Procedure: THORACIC FIVE-THORACIC ELEVEN FUSION, THORACIC EIGHT TRANSPEDICULAR DECOMPRESSION AND PARTIAL CORPECTOMY with O-Arm;  Surgeon: Bedelia Person, MD;  Location: [facility] OR;  Service: Neurosurgery;  Laterality: N/A;   PERIPHERAL VASCULAR BALLOON ANGIOPLASTY  09/02/2021   Procedure: PERIPHERAL VASCULAR BALLOON ANGIOPLASTY;  Surgeon: Nada Libman, MD;  [facility]: [facility];  Service: Cardiovascular;;   PERIPHERAL  VASCULAR BALLOON ANGIOPLASTY  12/28/2021   Procedure: PERIPHERAL VASCULAR BALLOON ANGIOPLASTY;  Surgeon: Nada Libman, MD;  [facility]: [facility];  Service: Cardiovascular;;  Posterior Tibial PTA only   TEE WITHOUT CARDIOVERSION N/A 09/28/2017   Procedure: TRANSESOPHAGEAL ECHOCARDIOGRAM (TEE);  Surgeon: Thurmon Fair, MD;  Location: [facility] ENDOSCOPY;  Service:  Cardiovascular;  Laterality: N/A;   TEE WITHOUT CARDIOVERSION N/A 09/03/2021   Procedure: TRANSESOPHAGEAL ECHOCARDIOGRAM (TEE);  Surgeon: Chilton Si, MD;  Location: [facility] ENDOSCOPY;  Service: Cardiovascular;  Laterality: N/A;   TEE WITHOUT CARDIOVERSION N/A 07/15/2022   Procedure: TRANSESOPHAGEAL ECHOCARDIOGRAM;  Surgeon: Thurmon Fair, MD;  [facility];  Service: Cardiovascular;  Laterality: N/A;   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 08/08/2022   MRSA bacteremia 08/08/2022   AKI (acute kidney injury), ruled out 07/27/2022   Discitis thoracic region 07/27/2022   Bacteremia due to methicillin resistant Staphylococcus aureus 07/27/2022   Severe protein-calorie malnutrition (HCC) 07/26/2022   PVD (peripheral vascular disease) (HCC) 07/26/2022   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   Bacteremia 07/15/2022   Acute respiratory failure with hypoxia (HCC) 07/12/2022   Atelectasis 07/12/2022   Sepsis due to pneumonia (HCC) 07/12/2022   Gangrene of toe of right foot (HCC)    Sepsis (HCC) 08/29/2021   SIRS (systemic inflammatory response syndrome) (HCC) 08/29/2021   Hyponatremia 08/29/2021   Hypomagnesemia 08/29/2021   Pure hypercholesterolemia 06/17/2021   Pain of left hand 09/06/2019   Cellulitis 08/26/2019   Persistent atrial fibrillation (HCC) 08/26/2019   HFimpEF (heart failure with improved  ejection fraction) (HCC) 10/18/2018   Transaminitis 10/17/2018   Hyperbilirubinemia 10/17/2018   Leukocytosis 10/17/2018   Sinusitis 10/17/2018   Chronic anticoagulation 09/30/2017   Smoker 09/30/2017   Cardiomyopathy (HCC) 09/30/2017   Sleep apnea suspected 09/30/2017   Essential hypertension 09/26/2017   Uncontrolled type 2 diabetes with hyperglycemia and hypoglycemia, insulin long term use 09/26/2017   Atrial fibrillation with RVR (HCC) 09/26/2017    ONSET DATE: 08/20/22  REFERRING DIAG: M46.44ICD-10-CMDiscitis of thoracic region  THERAPY DIAG:  Muscle weakness (generalized)  Unsteadiness on feet  Difficulty in walking, not elsewhere classified  Rationale for Evaluation and Treatment: Rehabilitation  SUBJECTIVE:  SUBJECTIVE STATEMENT: Using the TENS at home.  Feel like I'm getting stronger. Pt accompanied by: significant other  PERTINENT HISTORY:  Per patient, he was admitted to the local hospital on Aug 20, 2022 and had back surgery thereafter. Patient was then readmitted after being discharged for infection with low BG. He was discharged on October 28, 2022 on oral antibiotics with changes to his insulin regimen. past medical history of PVD, type 2 diabetes with hyperglycemia/hypoglycemia, seizure disorders, hypertension, hyperlipidemia, A-fib (on Eliquis) and recent bacteremia.  He is s/p angioplasty.  Patient reports since discharge he has been doing physical therapy/exercises at home but continues to have severe back pain    PAIN:  Are you having pain? Yes: NPRS scale: 6-7/10 Pain location: upper back and radiating Pain description: stabbing Aggravating factors: activity and with rest Relieving factors: pain medication  PRECAUTIONS: Back  RED FLAGS:    WEIGHT BEARING RESTRICTIONS:  No  FALLS: Has patient fallen in last 6 months? No  LIVING ENVIRONMENT: Lives with: lives with their spouse Lives in: House/apartment, pt reports housing issues since out of work Stairs: No Has following equipment at home: Environmental consultant - 2 wheeled  PLOF: Independent, previously worked as Photographer  PATIENT GOALS: be able to return to normal, walking without AD  OBJECTIVE:    TODAY'S TREATMENT: 01/10/2023 Activity Comments  TUG with RW:  17.38 sec  Improved from 19 sec  FTSTS:  14.66 sec with UE support at chair Improved from 25 sec  5x sit to stand:  17.31 sec  Hands on knees  LAQ 3#, 2 x10   Standing hamstring curl 2 x 10   Sidestepping at counter, 2 minutes 3#  Forward/back walking 1 minute at parallel bars 1 UE support, 3#  Alt step taps to 4" step x 10 1 UE support  Gastroc stretch: Foot propped at 4" step Runner's stretch, 2 x 30"  Good stretch, but unsteady Good stretch, good form  Stair negotiation:  Bilat rails, step through pattern ascending; step-to pattern descending (6" step), step through pattern descending (4" step) supervision       PATIENT EDUCATION: Education details: Updates to HEP; progress towards goals; still use RW due to increased fall risk, will work on transition to cane in therapy Person educated: Patient Education method: Explanation Education comprehension: verbalized understanding  HOME EXERCISE PROGRAM: Access Code: [email or web address] URL: [email or web address] Date: 01/10/2023 Prepared by: [facility] - Outpatient  Rehab - [facility]  Exercises - Seated Knee Extension with Resistance  - 1 x daily - 7 x weekly - 3 sets - 10 reps - Seated Hamstring Curls with Resistance  - 1 x daily - 7 x weekly - 3 sets - 10 reps - Seated Hip Abduction with Resistance  - 1 x daily - 7 x weekly - 3 sets - 10 reps - Seated Hip Adduction Isometrics with Ball  - 1 x daily - 7 x weekly - 3 sets - 10 reps - Hooklying Clamshell with Resistance  - 1  x daily - 7 x weekly - 3 sets - 10 reps - Seated Shoulder Horizontal Abduction with Resistance  - 1 x daily - 7 x weekly - 3 sets - 10 reps - Seated Shoulder Row with Resistance Anchored at Feet  - 1 x daily - 7 x weekly - 3 sets - 10 reps - Alternating Step Taps with Counter Support  - 1 x daily - 5 x weekly - 2 sets - 10 reps - Standing Gastroc  Stretch at Counter  - 1-2 x daily - 7 x weekly - 1 sets - 3 reps - 30 sec hold ---------------------------------------------------------------------- DIAGNOSTIC FINDINGS: IMPRESSION: 1. Significantly regressed bilateral pleural effusions and improved bilateral ventilation since last month. 2. No new cardiopulmonary abnormality  COGNITION: Overall cognitive status: Within functional limits for tasks assessed   SENSATION: Proprioception: Impaired  Decreased LLE > RLE COORDINATION: Minimal impairment LLE  EDEMA:  none  MUSCLE TONE: NT      POSTURE: No Significant postural limitations  LOWER EXTREMITY ROM:     Active  Right Eval Left Eval  Hip flexion    Hip extension    Hip abduction    Hip adduction    Hip internal rotation    Hip external rotation    Knee flexion 120 120  Knee extension 0 0  Ankle dorsiflexion 15 10  Ankle plantarflexion    Ankle inversion    Ankle eversion     (Blank rows = not tested)  LOWER EXTREMITY MMT:    MMT Right Eval Left Eval  Hip flexion 3+ 3+  Hip extension    Hip abduction 3+ 3+  Hip adduction 4 4  Hip internal rotation    Hip external rotation    Knee flexion 4+ 4  Knee extension 5 4-  Ankle dorsiflexion 4+ 3+  Ankle plantarflexion 3+ 3+  Ankle inversion    Ankle eversion    (Blank rows = not tested)  BED MOBILITY:  Modified indep due to back precautions  TRANSFERS: Assistive device utilized: Environmental consultant - 2 wheeled  Sit to stand: Modified independence Stand to sit: Modified independence Chair to chair: Modified independence Floor:  NT    CURB:  Level of Assistance:  SBA Assistive device utilized: Environmental consultant - 2 wheeled Curb Comments:   STAIRS: Level of Assistance: SBA Stair Negotiation Technique: Step to Pattern Alternating Pattern  with Bilateral Rails Number of Stairs: 8  Height of Stairs: 4-6"  Comments:   GAIT: Gait pattern: Left foot flat Distance walked:  Assistive device utilized: Walker - 2 wheeled Level of assistance: Modified independence Comments: ambulates with good stride, decreased speed. Without AD ambulates with past retracts  FUNCTIONAL TESTS:  5 times sit to stand: 25 sec w/ BUE Timed up and go (TUG): 19 sec  M-CTSIB  Condition 1: Firm Surface, EO 30 Sec, Moderate Sway  Condition 2: Firm Surface, EC 10 Sec,  left LOB  Sway  Condition 3: Foam Surface, EO  Sec,  Sway  Condition 4: Foam Surface, EC  Sec,  Sway         GOALS: Goals reviewed with patient? Yes  SHORT TERM GOALS: Target date: 01/11/2023    Patient will be independent in HEP to improve functional outcomes Baseline: Goal status: IN PROGRESS  2.  Patient will achieve 12 seconds for TUG test to manifest reduced risk for falls Baseline: 19 w/ RW>17.38 sec 01/10/2023 Goal status: PARTIALLY MET, 01/10/2023  3.  Demo improved BLE strength and reduced risk for falls per time 15 sec 5xSTS test Baseline: 25 sec w/ BUE push-off; 14.66 sec with UE support at chair; 17.3 sec with hands on knees Goal status: MET, 01/10/2023  4. Demo modified independent stair ambulation with reciprocal pattern and single HR to improve community mobility  Baseline: SBA, BHR  Goal status: IN PROGRESS  LONG TERM GOALS: Target date: 01/25/2023    Demo low risk for falls per score 50/56 Berg Balance Test Baseline: 38/56 Goal status: IN PROGRESS  2.  Demo independent ambulation level surfaces w/out AD x 150 ft to improve independence and safety with household ambulation Baseline: dependent on RW Goal status: INITIAL  3.  Demo gross BLE strength to 4+/5 to improve activity  tolerance and stability for unsupported standing/gait Baseline:  Goal status: INITIAL    ASSESSMENT:  CLINICAL IMPRESSION: Began assessing STGs this visit, with pt meeting STG 3 for FTSTS test for improved functional strength.  He has partially met STG 2 for TUG, with improvement noted, just not to goal level.  Worked on strengthening exercises, standing balance, and standing gastroc (runner's) stretch and added to HEP.  He appears to be performing current HEP plus additional exercises at home.  He will continue to benefit from skilled PT towards goals for improved functional mobility, strength, and balance.    OBJECTIVE IMPAIRMENTS: Abnormal gait, cardiopulmonary status limiting activity, decreased activity tolerance, decreased balance, decreased coordination, decreased endurance, decreased knowledge of use of DME, difficulty walking, decreased strength, impaired perceived functional ability, and pain.   ACTIVITY LIMITATIONS: carrying, lifting, bending, standing, squatting, stairs, transfers, bathing, dressing, reach over head, and locomotion level  PARTICIPATION LIMITATIONS: meal prep, cleaning, laundry, driving, shopping, community activity, occupation, and yard work  PERSONAL FACTORS: Time since onset of injury/illness/exacerbation and 1-2 comorbidities: PMH  are also affecting patient's functional outcome.   REHAB POTENTIAL: Excellent  CLINICAL DECISION MAKING: Evolving/moderate complexity  EVALUATION COMPLEXITY: Moderate  PLAN:  PT FREQUENCY: 1-2x/week  PT DURATION: 6 weeks  PLANNED INTERVENTIONS: Therapeutic exercises, Therapeutic activity, Neuromuscular re-education, Balance training, Gait training, Patient/Family education, Self Care, Joint mobilization, Stair training, Vestibular training, Canalith repositioning, Orthotic/Fit training, DME instructions, Aquatic Therapy, Electrical stimulation, Cryotherapy, Moist heat, and Manual therapy  PLAN FOR NEXT SESSION:  Review  updates to HEP and finish assessing STGs.  Try step ups/step downs (?) for strengthening, corner balance activities, general BLE strength.  Gait training with cane   Lonia Blood, PT 01/10/23 11:48 AM Phone: [phone] Fax: [phone]  [facility] at [facility] [address] [place], [address] [place], [place] [address] Phone # [phone] Fax # [phone]

## 2023-01-11 ENCOUNTER — Ambulatory Visit: Payer: Medicaid Other | Admitting: Family Medicine

## 2023-01-12 ENCOUNTER — Ambulatory Visit: Payer: Medicaid Other | Admitting: Physical Therapy

## 2023-01-17 ENCOUNTER — Ambulatory Visit: Payer: Medicaid Other | Admitting: Physical Therapy

## 2023-01-18 ENCOUNTER — Other Ambulatory Visit (HOSPITAL_COMMUNITY): Payer: Self-pay

## 2023-01-18 MED ORDER — HYDROCODONE-ACETAMINOPHEN 10-325 MG PO TABS
1.0000 | ORAL_TABLET | ORAL | 0 refills | Status: DC | PRN
Start: 2023-01-18 — End: 2023-02-05
  Filled 2023-01-18: qty 10, 2d supply, fill #0

## 2023-01-19 ENCOUNTER — Ambulatory Visit: Payer: Medicaid Other | Admitting: Physical Therapy

## 2023-01-19 ENCOUNTER — Encounter: Payer: Self-pay | Admitting: Physical Therapy

## 2023-01-19 DIAGNOSIS — R2681 Unsteadiness on feet: Secondary | ICD-10-CM

## 2023-01-19 DIAGNOSIS — R2689 Other abnormalities of gait and mobility: Secondary | ICD-10-CM

## 2023-01-19 DIAGNOSIS — R262 Difficulty in walking, not elsewhere classified: Secondary | ICD-10-CM

## 2023-01-19 DIAGNOSIS — M6281 Muscle weakness (generalized): Secondary | ICD-10-CM | POA: Diagnosis not present

## 2023-01-19 NOTE — Therapy (Signed)
OUTPATIENT PHYSICAL THERAPY NEURO TREATMENT   Patient Name: David [name] MRN: [id number] DOB:03/01/1961, 62 y.o., male Today's Date: 01/19/2023   PCP: [name], MD REFERRING PROVIDER: Eleonore Chiquito, FNP  END OF SESSION:  PT End of Session - 01/19/23 1551     Visit Number 7    Number of Visits 12    Date for PT Re-Evaluation 01/25/23    Authorization Type Medicaid-[employer]    Authorization - Number of Visits 27    PT Start Time 1145    PT Stop Time 1230    PT Time Calculation (min) 45 min    Equipment Utilized During Treatment Other (comment)   large aquatic barbell, 5" step   Activity Tolerance Patient tolerated treatment well    Behavior During Therapy WFL for tasks assessed/performed               Past Medical History:  Diagnosis Date   Cardiomyopathy (HCC)    a. EF 45% in 2019.   CHF (congestive heart failure) (HCC)    Chronic anticoagulation 09/30/2017   Diabetes mellitus type 2 in nonobese [facility])    Discitis of thoracic region 09/25/2022   Does not have health insurance    Essential hypertension 09/26/2017   Financial difficulties    H/O noncompliance with medical treatment, presenting hazards to health    Hardware complicating wound infection (HCC) 09/25/2022   Non-insulin treated type 2 diabetes mellitus (HCC) 09/26/2017   Persistent atrial fibrillation (HCC)    Sleep apnea suspected 09/30/2017   Past Surgical History:  Procedure Laterality Date   ABDOMINAL AORTOGRAM W/LOWER EXTREMITY N/A 09/02/2021   Procedure: ABDOMINAL AORTOGRAM W/LOWER EXTREMITY;  Surgeon: Nada Libman, MD;  [facility];  Service: Cardiovascular;  Laterality: N/A;   ABDOMINAL AORTOGRAM W/LOWER EXTREMITY N/A 12/28/2021   Procedure: ABDOMINAL AORTOGRAM W/LOWER EXTREMITY;  Surgeon: Nada Libman, MD;  [facility]: [facility];  Service: Cardiovascular;  Laterality: N/A;   ABDOMINAL AORTOGRAM W/LOWER EXTREMITY N/A 07/28/2022   Procedure:  ABDOMINAL AORTOGRAM W/LOWER EXTREMITY;  Surgeon: Cephus Shelling, MD;  [facility]: [facility];  Service: Cardiovascular;  Laterality: N/A;   AMPUTATION Right 09/04/2021   Procedure: 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;  Surgeon: Nadara Mustard, MD;  Location: [facility] OR;  Service: Orthopedics;  Laterality: Right;   AMPUTATION Right 08/02/2022   Procedure: AMPUTATION RAY 5TH METATARSAL OF RIGHT FOOT;  Surgeon: [name], MD;  Location: [facility] OR;  Service: Orthopedics;  Laterality: Right;   APPENDECTOMY  1971   BONE BIOPSY Right 08/02/2022   Procedure: BONE BIOPSY OF 5TH METATARSAL RIGHT FOOT;  Surgeon: [name], MD;  Location: [facility];  Service: Orthopedics;  Laterality: Right;   CARDIOVERSION N/A 09/28/2017   Procedure: CARDIOVERSION;  Surgeon: Thurmon Fair, MD;  [facility];  Service: Cardiovascular;  Laterality: N/A;   IRRIGATION AND DEBRIDEMENT FOOT  08/02/2022   Procedure: IRRIGATION AND DEBRIDEMENT RIGHT 5TH METATARSAL FOOT;  Surgeon: [name], MD;  Location: [facility];  Service: Orthopedics;;   LAMINECTOMY WITH POSTERIOR LATERAL ARTHRODESIS LEVEL 4 N/A 08/30/2022   Procedure: THORACIC FIVE-THORACIC ELEVEN FUSION, THORACIC EIGHT TRANSPEDICULAR DECOMPRESSION AND PARTIAL CORPECTOMY with O-Arm;  Surgeon: Bedelia Person, MD;  Location: [facility] OR;  Service: Neurosurgery;  Laterality: N/A;   PERIPHERAL VASCULAR BALLOON ANGIOPLASTY  09/02/2021   Procedure: PERIPHERAL VASCULAR BALLOON ANGIOPLASTY;  Surgeon: Nada Libman, MD;  [facility];  Service: Cardiovascular;;   PERIPHERAL VASCULAR BALLOON ANGIOPLASTY  12/28/2021  Procedure: PERIPHERAL VASCULAR BALLOON ANGIOPLASTY;  Surgeon: Nada Libman, MD;  [facility];  Service: Cardiovascular;;  Posterior Tibial PTA only   TEE WITHOUT CARDIOVERSION N/A 09/28/2017   Procedure: TRANSESOPHAGEAL ECHOCARDIOGRAM (TEE);  Surgeon: Thurmon Fair, MD;  Location: [facility] ENDOSCOPY;  Service: Cardiovascular;   Laterality: N/A;   TEE WITHOUT CARDIOVERSION N/A 09/03/2021   Procedure: TRANSESOPHAGEAL ECHOCARDIOGRAM (TEE);  Surgeon: Chilton Si, MD;  Location: [facility] ENDOSCOPY;  Service: Cardiovascular;  Laterality: N/A;   TEE WITHOUT CARDIOVERSION N/A 07/15/2022   Procedure: TRANSESOPHAGEAL ECHOCARDIOGRAM;  Surgeon: Thurmon Fair, MD;  [facility];  Service: Cardiovascular;  Laterality: N/A;   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 08/08/2022   MRSA bacteremia 08/08/2022   AKI (acute kidney injury), ruled out 07/27/2022   Discitis thoracic region 07/27/2022   Bacteremia due to methicillin resistant Staphylococcus aureus 07/27/2022   Severe protein-calorie malnutrition (HCC) 07/26/2022   PVD (peripheral vascular disease) (HCC) 07/26/2022   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   Bacteremia 07/15/2022   Acute respiratory failure with hypoxia (HCC) 07/12/2022   Atelectasis 07/12/2022   Sepsis due to pneumonia (HCC) 07/12/2022   Gangrene of toe of right foot (HCC)    Sepsis (HCC) 08/29/2021   SIRS (systemic inflammatory response syndrome) (HCC) 08/29/2021   Hyponatremia 08/29/2021   Hypomagnesemia 08/29/2021   Pure hypercholesterolemia 06/17/2021   Pain of left hand 09/06/2019   Cellulitis 08/26/2019   Persistent atrial fibrillation (HCC) 08/26/2019   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) 10/18/2018   Transaminitis 10/17/2018   Hyperbilirubinemia 10/17/2018   Leukocytosis 10/17/2018   Sinusitis 10/17/2018   Chronic anticoagulation 09/30/2017   Smoker 09/30/2017   Cardiomyopathy (HCC) 09/30/2017   Sleep apnea suspected 09/30/2017   Essential hypertension 09/26/2017   Uncontrolled type 2 diabetes with hyperglycemia and hypoglycemia, insulin long term use 09/26/2017   Atrial fibrillation with RVR (HCC) 09/26/2017    ONSET DATE: 08/20/22  REFERRING DIAG: M46.44ICD-10-CMDiscitis of thoracic region  THERAPY DIAG:  Unsteadiness on feet  Difficulty in walking, not elsewhere classified  Muscle weakness (generalized)  Other abnormalities of gait and mobility  Rationale for Evaluation and Treatment: Rehabilitation  SUBJECTIVE:  SUBJECTIVE STATEMENT: Patient presents alone in transport chair brought by front desk staff.  He uses cane to access changing room and pool stairs SBA-CGA.  He reports he is having pain today as always. Pt accompanied by: significant other  PERTINENT HISTORY:  Per patient, he was admitted to the local hospital on Aug 20, 2022 and had back surgery thereafter. Patient was then readmitted after being discharged for infection with low BG. He was discharged on October 28, 2022 on oral antibiotics with changes to his insulin regimen. past medical history of PVD, type 2 diabetes with hyperglycemia/hypoglycemia, seizure disorders, hypertension, hyperlipidemia, A-fib (on Eliquis) and recent bacteremia.  He is s/p angioplasty.  Patient reports since discharge he has been doing physical therapy/exercises at home but continues to have severe back pain    PAIN:  Are you having pain? Yes: NPRS scale: 6/10 Pain location: upper back and radiating Pain description:  stabbing Aggravating factors: activity and with rest Relieving factors: pain medication  PRECAUTIONS: Back  RED FLAGS:    WEIGHT BEARING RESTRICTIONS: No  FALLS: Has patient fallen in last 6 months? No  LIVING ENVIRONMENT: Lives with: lives with their spouse Lives in: House/apartment, pt reports housing issues since out of work Stairs: No Has following equipment at home: Environmental consultant - 2 wheeled  PLOF: Independent, previously worked as Photographer  PATIENT GOALS: be able to return to normal, walking without AD  OBJECTIVE:   TODAY'S TREATMENT:  01/19/2023 Aquatic therapy at [facility] - pool temperature 92 degrees   Patient seen for aquatic therapy today.  Treatment took place in water 3.6-4.8 feet deep depending upon activity.  Patient entered and exited the pool via step-to pattern on stairs at [facility] level.   Exercises: Warmup: using large aquatic barbell pt ambulates forward, backwards, and sideways 4x18 ft, CGA -In 4 ft water static balance no UE support SBA-minA:  Normal BOS eyes open > eyes closed 3 rounds each varying time > feet together eyes open x2 minutes with good progression to normal ankle strategy and progression away from posterior minA for support; normal BOS upward reach > lateral reach > Crossbody reaching to elicit trunk righting reactions forcing patient far outside BOS almost into SLS, he has marked overactivity in trunk musculature throughout activities noted by visible musculature twitching -Step up and over 5" step w/ unilateral wall support SBA x20, attempted retro, but pt very unstable with task so d/c'd -STS from water bench 2x10, no UE support SBA to elicit ankle righting in immediate standing -Marching w/ intermittent wall touch for dynamic SLS -Wall bumps x20 for ankle strategy and strengthening  Patient requires buoyancy of the water for support for reduced fall risk with gait training and balance exercises with SBA-minA support. Exercises able  to be performed safely in water without the risk of fall compared to those same exercises performed on land; viscosity of water needed for resistance for strengthening. Current of water provides perturbations for challenging static and dynamic balance.    HOME EXERCISE PROGRAM: Access Code: [email or web address] URL: [email or web address] Date: 01/10/2023 Prepared by: [facility] - Outpatient  Rehab - [facility]  Exercises - Seated Knee Extension with Resistance  - 1 x daily - 7 x weekly - 3 sets - 10 reps - Seated Hamstring Curls with Resistance  - 1 x daily - 7 x weekly - 3 sets - 10 reps - Seated Hip Abduction with Resistance  - 1 x daily - 7 x weekly - 3 sets - 10 reps -  Seated Hip Adduction Isometrics with Ball  - 1 x daily - 7 x weekly - 3 sets - 10 reps - Hooklying Clamshell with Resistance  - 1 x daily - 7 x weekly - 3 sets - 10 reps - Seated Shoulder Horizontal Abduction with Resistance  - 1 x daily - 7 x weekly - 3 sets - 10 reps - Seated Shoulder Row with Resistance Anchored at Feet  - 1 x daily - 7 x weekly - 3 sets - 10 reps - Alternating Step Taps with Counter Support  - 1 x daily - 5 x weekly - 2 sets - 10 reps - Standing Gastroc Stretch at Counter  - 1-2 x daily - 7 x weekly - 1 sets - 3 reps - 30 sec hold  ---------------------------------------------------------------------- DIAGNOSTIC FINDINGS: IMPRESSION: 1. Significantly regressed bilateral pleural effusions and improved bilateral ventilation since last month. 2. No new cardiopulmonary abnormality  COGNITION: Overall cognitive status: Within functional limits for tasks assessed   SENSATION: Proprioception: Impaired  Decreased LLE > RLE COORDINATION: Minimal impairment LLE  EDEMA:  none  MUSCLE TONE: NT      POSTURE: No Significant postural limitations  LOWER EXTREMITY ROM:     Active  Right Eval Left Eval  Hip flexion    Hip extension    Hip abduction    Hip adduction    Hip internal  rotation    Hip external rotation    Knee flexion 120 120  Knee extension 0 0  Ankle dorsiflexion 15 10  Ankle plantarflexion    Ankle inversion    Ankle eversion     (Blank rows = not tested)  LOWER EXTREMITY MMT:    MMT Right Eval Left Eval  Hip flexion 3+ 3+  Hip extension    Hip abduction 3+ 3+  Hip adduction 4 4  Hip internal rotation    Hip external rotation    Knee flexion 4+ 4  Knee extension 5 4-  Ankle dorsiflexion 4+ 3+  Ankle plantarflexion 3+ 3+  Ankle inversion    Ankle eversion    (Blank rows = not tested)  BED MOBILITY:  Modified indep due to back precautions  TRANSFERS: Assistive device utilized: Environmental consultant - 2 wheeled  Sit to stand: Modified independence Stand to sit: Modified independence Chair to chair: Modified independence Floor:  NT    CURB:  Level of Assistance: SBA Assistive device utilized: Environmental consultant - 2 wheeled Curb Comments:   STAIRS: Level of Assistance: SBA Stair Negotiation Technique: Step to Pattern Alternating Pattern  with Bilateral Rails Number of Stairs: 8  Height of Stairs: 4-6"  Comments:   GAIT: Gait pattern: Left foot flat Distance walked:  Assistive device utilized: Walker - 2 wheeled Level of assistance: Modified independence Comments: ambulates with good stride, decreased speed. Without AD ambulates with past retracts  FUNCTIONAL TESTS:  5 times sit to stand: 25 sec w/ BUE Timed up and go (TUG): 19 sec  M-CTSIB  Condition 1: Firm Surface, EO 30 Sec, Moderate Sway  Condition 2: Firm Surface, EC 10 Sec,  left LOB  Sway  Condition 3: Foam Surface, EO  Sec,  Sway  Condition 4: Foam Surface, EC  Sec,  Sway         GOALS: Goals reviewed with patient? Yes  SHORT TERM GOALS: Target date: 01/11/2023    Patient will be independent in HEP to improve functional outcomes Baseline: Goal status: IN PROGRESS  2.  Patient will achieve 12 seconds for TUG  test to manifest reduced risk for falls Baseline: 19 w/  RW>17.38 sec 01/10/2023 Goal status: PARTIALLY MET, 01/10/2023  3.  Demo improved BLE strength and reduced risk for falls per time 15 sec 5xSTS test Baseline: 25 sec w/ BUE push-off; 14.66 sec with UE support at chair; 17.3 sec with hands on knees Goal status: MET, 01/10/2023  4. Demo modified independent stair ambulation with reciprocal pattern and single HR to improve community mobility  Baseline: SBA, BHR  Goal status: IN PROGRESS  LONG TERM GOALS: Target date: 01/25/2023    Demo low risk for falls per score 50/56 Berg Balance Test Baseline: 38/56 Goal status: IN PROGRESS  2.  Demo independent ambulation level surfaces w/out AD x 150 ft to improve independence and safety with household ambulation Baseline: dependent on RW Goal status: INITIAL  3.  Demo gross BLE strength to 4+/5 to improve activity tolerance and stability for unsupported standing/gait Baseline:  Goal status: INITIAL    ASSESSMENT:  CLINICAL IMPRESSION: Focus of skilled session on introduction of aquatic therapy to supplement land based therapy.  Large component of session focused on steady state and anticipatory balance.  He does elicit ankle strategy with increased volume of response.  This volume does decrease with prolonged positioning and repetition.  Education provided on balance strategies, overactivity noted that may be contributing to pain, and updated facility policy informing patient to bring caregiver to push transport chair or use RW to ambulate down hallway.  He continues to benefit from skilled PT, particularly in the aquatic setting, to further challenge stability in an environment that may offer added safety benefits.  OBJECTIVE IMPAIRMENTS: Abnormal gait, cardiopulmonary status limiting activity, decreased activity tolerance, decreased balance, decreased coordination, decreased endurance, decreased knowledge of use of DME, difficulty walking, decreased strength, impaired perceived functional ability,  and pain.   ACTIVITY LIMITATIONS: carrying, lifting, bending, standing, squatting, stairs, transfers, bathing, dressing, reach over head, and locomotion level  PARTICIPATION LIMITATIONS: meal prep, cleaning, laundry, driving, shopping, community activity, occupation, and yard work  PERSONAL FACTORS: Time since onset of injury/illness/exacerbation and 1-2 comorbidities: PMH  are also affecting patient's functional outcome.   REHAB POTENTIAL: Excellent  CLINICAL DECISION MAKING: Evolving/moderate complexity  EVALUATION COMPLEXITY: Moderate  PLAN:  PT FREQUENCY: 1-2x/week  PT DURATION: 6 weeks  PLANNED INTERVENTIONS: Therapeutic exercises, Therapeutic activity, Neuromuscular re-education, Balance training, Gait training, Patient/Family education, Self Care, Joint mobilization, Stair training, Vestibular training, Canalith repositioning, Orthotic/Fit training, DME instructions, Aquatic Therapy, Electrical stimulation, Cryotherapy, Moist heat, and Manual therapy  PLAN FOR NEXT SESSION:  Review updates to HEP and finish assessing STGs.  Try step ups/step downs (?) for strengthening, corner balance activities, general BLE strength.  Gait training with cane  Aquatic therapy:  for land therapist - is goal an HEP and community maintenance or just to supplement land therapy?

## 2023-01-20 ENCOUNTER — Other Ambulatory Visit: Payer: Self-pay | Admitting: Family Medicine

## 2023-01-20 NOTE — Telephone Encounter (Signed)
Requested medications are due for refill today.  Unsure  Requested medications are on the active medications list.  yes  Last refill. unsure  Future visit scheduled.   yes  Notes to clinic.  Pt has several rx's for this medication on med list. Refill/refusal not delegated.    Requested Prescriptions  Pending Prescriptions Disp Refills   HYDROcodone-acetaminophen (NORCO) 10-325 MG tablet 15 tablet 0    Sig: Take 1 tablet by mouth every 6 (six) hours as needed for severe pain.     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)   [place] [facility] [name], MD   2 months ago Hospital discharge follow-up   [facility] [place], Jomarie Longs, FNP   1 year ago Type 2 diabetes mellitus with hyperglycemia, with long-term current use of insulin [facility])   [place] [facility] [place], [place], [place]   2 years ago Type 2 diabetes mellitus with hyperglycemia, with long-term current use of insulin [facility])   [place] [facility] [place], Shea Stakes, NP   2 years ago Essential hypertension   [place] [facility] [place], Shea Stakes, NP       Future Appointments             In 1 month [name], MD [facility]   In 1 month Danelle Earthly, MD [facility]

## 2023-01-20 NOTE — Telephone Encounter (Signed)
Medication Refill - Medication: HYDROcodone-acetaminophen (NORCO) 10-325 MG tablet   Has the patient contacted their pharmacy? No.  Preferred Pharmacy (with phone number or street name):  [facility] Phone: [phone]  Fax: [phone]     Has the patient been seen for an appointment in the last year OR does the patient have an upcoming appointment? Yes.    Agent: Please be advised that RX refills may take up to 3 business days. We ask that you follow-up with your pharmacy.  Patient stated his pain mgmt doctor Dr Maisie Fus advise him to contact PCP for a 10 day supply taking them every 4hrs (60 pills)

## 2023-01-23 ENCOUNTER — Ambulatory Visit: Payer: Self-pay | Admitting: *Deleted

## 2023-01-23 NOTE — Telephone Encounter (Signed)
Spoke with patient . Patient voiced that he is almost out of pain medication. Advised patient to contact the provider that has been authorizing refills for his pain medication. Patient Reports that he was told by his surgeon that his primary would prescribe him further pain medication if needed. Patient voiced that he was calling because he needs refills.  Patient voiced that he feels that his PCP as dropped the ball and  he does not understand why they will not refill his pain medication. Patient voiced that he was on his way to the office now to speak with his PCP. Advised that PCP is not in the office so there was no need to come to the office today. Advised that I would forward message provider. Advised that the our providers usually defers to prescribing provider or refer to the pain clinic. Patient voiced  that he has an appointment  with the pain clinic on 01/29/2023. after call disconnected it was noticed that 27th is a Sunday. Call placed  to Patient to  Confirm appointment with pain center. Patient did not answer. Forwarding to PCP please advised

## 2023-01-23 NOTE — Telephone Encounter (Signed)
  Chief Complaint: severe back pain requesting medication refills until seen by pain management clinic 01/29/23 patient has 3 tablets left. Last dispensed 01/18/23 10 tablets taken every 4 hours.  Symptoms: severe back pain continuous  Frequency: na Pertinent Negatives: Patient denies na Disposition: [] ED /[] Urgent Care (no appt availability in office) / [] Appointment(In office/virtual)/ []  [facility]/ [] Home Care/ [] Refused Recommended Disposition /[]  Mobile Bus/ [x]  Follow-up with PCP Additional Notes:   Next OV 03/01/23 . Please advise if medication can be prescribed until patient seen at pain management clinic 01/29/23. Patient would like a call back.      Reason for Disposition  Back pain is a chronic symptom (recurrent or ongoing AND present > 4 weeks)  Answer Assessment - Initial Assessment Questions 1. ONSET: "When did the pain begin?"      On going since back surgery . 2. LOCATION: "Where does it hurt?" (upper, mid or lower back)     na 3. SEVERITY: "How bad is the pain?"  (e.g., Scale 1-10; mild, moderate, or severe)   - MILD (1-3): Doesn't interfere with normal activities.    - MODERATE (4-7): Interferes with normal activities or awakens from sleep.    - SEVERE (8-10): Excruciating pain, unable to do any normal activities.      severe 4. PATTERN: "Is the pain constant?" (e.g., yes, no; constant, intermittent)      Na  5. RADIATION: "Does the pain shoot into your legs or somewhere else?"     no 6. CAUSE:  "What do you think is causing the back pain?"      Back surgery  7. BACK OVERUSE:  "Any recent lifting of heavy objects, strenuous work or exercise?"     na 8. MEDICINES: "What have you taken so far for the pain?" (e.g., nothing, acetaminophen, NSAIDS)     Yes hydrocodone. Every 4 hours 1 tablet  9. NEUROLOGIC SYMPTOMS: "Do you have any weakness, numbness, or problems with bowel/bladder control?"     na 10. OTHER SYMPTOMS: "Do you have any other  symptoms?" (e.g., fever, abdomen pain, burning with urination, blood in urine)       Severe back pain almost out of medications  11. PREGNANCY: "Is there any chance you are pregnant?" "When was your last menstrual period?"       na  Protocols used: Back Pain-A-AH

## 2023-01-24 ENCOUNTER — Other Ambulatory Visit (HOSPITAL_COMMUNITY): Payer: Self-pay

## 2023-01-24 ENCOUNTER — Emergency Department (HOSPITAL_COMMUNITY)
Admission: EM | Admit: 2023-01-24 | Discharge: 2023-01-24 | Disposition: A | Discharge status: Home / Self Care | Payer: Medicaid Other | Attending: Emergency Medicine | Admitting: Emergency Medicine

## 2023-01-24 ENCOUNTER — Encounter (HOSPITAL_COMMUNITY): Payer: Self-pay

## 2023-01-24 ENCOUNTER — Other Ambulatory Visit: Payer: Self-pay

## 2023-01-24 ENCOUNTER — Ambulatory Visit: Payer: Medicaid Other

## 2023-01-24 DIAGNOSIS — Z7901 Long term (current) use of anticoagulants: Secondary | ICD-10-CM | POA: Diagnosis not present

## 2023-01-24 DIAGNOSIS — Z794 Long term (current) use of insulin: Secondary | ICD-10-CM | POA: Diagnosis not present

## 2023-01-24 DIAGNOSIS — M545 Low back pain, unspecified: Secondary | ICD-10-CM | POA: Insufficient documentation

## 2023-01-24 DIAGNOSIS — M549 Dorsalgia, unspecified: Secondary | ICD-10-CM | POA: Diagnosis present

## 2023-01-24 DIAGNOSIS — G8929 Other chronic pain: Secondary | ICD-10-CM | POA: Insufficient documentation

## 2023-01-24 MED ORDER — HYDROCODONE-ACETAMINOPHEN 10-325 MG PO TABS
1.0000 | ORAL_TABLET | Freq: Four times a day (QID) | ORAL | 0 refills | Status: DC | PRN
Start: 2023-01-24 — End: 2023-02-05
  Filled 2023-01-24: qty 15, 4d supply, fill #0

## 2023-01-24 MED ORDER — HYDROMORPHONE HCL 2 MG/ML IJ SOLN
2.0000 mg | Freq: Once | INTRAMUSCULAR | Status: AC
  Administered 2023-01-24: 2 mg via INTRAMUSCULAR
  Filled 2023-01-24: qty 1

## 2023-01-24 NOTE — ED Provider Notes (Signed)
[name] EMERGENCY DEPARTMENT AT [facility] Provider Note   CSN: [id number] Arrival date & time: 01/24/23  1819     History  Chief Complaint  Patient presents with   Back Pain    David [name] is a 62 y.o. 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  Patient had epidural abscess and required surgery by Dr. Maisie Fus several months ago.  Patient is still on antibiotics as prescribed by infectious disease.  Patient states that he has chronic back pain.  He was supposed to follow-up with pain management last week but the nurse called out so his appointment was canceled.  It was rescheduled to October 27.  He states that he called his neurosurgeon who states that the PCP should prescribe his pain medicine and then he called his PCP yesterday who states that the neurosurgeon should prescribe it.  Patient states that he is in a lot of pain and ran out of his medicines.  Denies any fever.  Patient has a back brace on and denies worsening numbness or weakness.  The history is provided by the patient.       Home Medications Prior to Admission medications   Medication Sig Start Date End Date Taking? Authorizing Provider  Accu-Chek Softclix Lancets lancets Use to check blood sugar 3 times daily. 11/15/22   [name], MD  apixaban (ELIQUIS) 5 MG TABS tablet Take 1 tablet (5 mg total) by mouth 2 (two) times daily. 10/31/22   Eleonore Chiquito, FNP  bisacodyl (DULCOLAX) 10 MG suppository Place 1 suppository (10 mg total) rectally daily as needed for moderate constipation. 10/21/22   Kathlen Mody, MD  Blood Glucose Monitoring Suppl (ACCU-CHEK GUIDE) w/Device KIT Use to check blood sugar 3 times daily. E11.9 11/15/22   [name], MD  busPIRone (BUSPAR) 15 MG tablet Take 1 tablet (15 mg total) by mouth 2 (two) times daily. 10/28/22   Burnadette Pop, MD  carvedilol (COREG) 3.125 MG tablet Take 1 tablet (3.125 mg total) by mouth 2 (two)  times daily. 10/28/22   Burnadette Pop, MD  digoxin (LANOXIN) 0.125 MG tablet Take 1 tablet (125 mcg total) by mouth once daily. 10/28/22   Burnadette Pop, MD  doxycycline (VIBRA-TABS) 100 MG tablet Take 1 tablet (100 mg total) by mouth every 12 (twelve) hours. 10/28/22   Burnadette Pop, MD  DULoxetine (CYMBALTA) 20 MG capsule Take 1 capsule (20 mg total) by mouth daily. 10/28/22   Burnadette Pop, MD  famotidine (PEPCID) 20 MG tablet Take 1 tablet (20 mg total) by mouth 2 times daily. 10/28/22   Burnadette Pop, MD  ferrous sulfate 325 (65 FE) MG tablet Take 1 tablet (325 mg total) by mouth every other day. 10/28/22   Burnadette Pop, MD  finasteride (PROSCAR) 5 MG tablet Take 1 tablet (5 mg total) by mouth daily. 10/28/22   Burnadette Pop, MD  fluticasone (FLONASE) 50 MCG/ACT nasal spray Place 2 sprays into both nostrils daily. 10/21/22   Kathlen Mody, MD  furosemide (LASIX) 40 MG tablet Take 1/2 tablet (20 mg total) by mouth daily. 10/28/22   Burnadette Pop, MD  glucose blood (ACCU-CHEK GUIDE) test strip Use to check blood sugar 3 times daily. 11/15/22   [name], MD  HYDROcodone-acetaminophen (NORCO) 10-325 MG tablet Take 1 tablet by mouth every 6 (six) hours as needed for severe pain. 10/31/22   [name], MD  HYDROcodone-acetaminophen (NORCO) 10-325 MG tablet Take 1 tablet by mouth every 8 (eight)  hours as needed for pain 11/09/22     HYDROcodone-acetaminophen (NORCO) 10-325 MG tablet Take 1 tablet by mouth every 4 (four) hours as needed for pain 11/25/22     HYDROcodone-acetaminophen (NORCO) 10-325 MG tablet Take 1 tablet by mouth every 4 (four) hours as needed for pain 11/30/22     HYDROcodone-acetaminophen (NORCO) 10-325 MG tablet Take 1 tablet by mouth every 4 (four) hours as needed for pain. 12/12/22     HYDROcodone-acetaminophen (NORCO) 10-325 MG tablet Take 1 tablet by mouth every 4 (four) hours as needed for pain 12/23/22   Dawley, Troy C, DO  HYDROcodone-acetaminophen (NORCO) 10-325  MG tablet Take 1 tablet by mouth every 4 (four) hours as needed for pain. 12/30/22     HYDROcodone-acetaminophen (NORCO) 10-325 MG tablet Take 1 tablet by mouth every 4 (four) hours as needed for pain. 01/18/23     insulin glargine (LANTUS SOLOSTAR) 100 UNIT/ML Solostar Pen Inject 25 Units into the skin daily. 12/29/22   [name], MD  Insulin Pen Needle (PEN NEEDLES) 32G X 4 MM MISC Use to inject insulin once daily. 11/15/22   [name], MD  lidocaine 4 % Place 1 patch onto the skin daily. 10/28/22   Burnadette Pop, MD  liver oil-zinc oxide (DESITIN) 40 % ointment Apply topically 2 (two) times daily. 10/21/22   Kathlen Mody, MD  magnesium oxide (MAG-OX) 400 (240 Mg) MG tablet Take 1 tablet (400 mg total) by mouth daily. 10/28/22   Burnadette Pop, MD  methocarbamol (ROBAXIN) 750 MG tablet Take 1 tablet (750 mg total) by mouth 3 (three) times daily as needed for muscle spasms 11/09/22     naloxone (NARCAN) nasal spray 4 mg/0.1 mL Opiates overdose 10/21/22   Kathlen Mody, MD  polyethylene glycol powder (GLYCOLAX/MIRALAX) 17 GM/SCOOP powder Take 17 g by mouth daily. 12/29/22   [name], MD  pregabalin (LYRICA) 75 MG capsule Take 1 capsule (75 mg total) by mouth at bedtime. 10/28/22   Burnadette Pop, MD  rosuvastatin (CRESTOR) 20 MG tablet Take 1 tablet (20 mg total) by mouth daily. 01/10/23   [name], MD  sacubitril-valsartan (ENTRESTO) 49-51 MG Take 1 tablet by mouth 2 (two) times daily. 10/28/22   Burnadette Pop, MD  senna-docusate (SENOKOT-S) 8.6-50 MG tablet Take 1 tablet by mouth 2 (two) times daily. 10/28/22   Burnadette Pop, MD  tamsulosin (FLOMAX) 0.4 MG CAPS capsule Take 1 capsule (0.4 mg total) by mouth daily after supper. 10/28/22   Burnadette Pop, MD  terbinafine (LAMISIL) 250 MG tablet Take 1 tablet (250 mg total) by mouth daily. 12/29/22   [name], MD      Allergies    Patient has no known allergies.    Review of Systems   Review of Systems   Musculoskeletal:  Positive for back pain.  All other systems reviewed and are negative.   Physical Exam Updated Vital Signs BP (!) 155/103 (BP Location: Left Arm)   Pulse (!) 111   Temp 97.7 F (36.5 C)   Resp 18   Ht 6' (1.829 m)   Wt 64.9 kg   SpO2 99%   BMI 19.41 kg/m  Physical Exam Vitals and nursing note reviewed.  Constitutional:      Comments: Uncomfortable   HENT:     Head: Normocephalic.     Nose: Nose normal.     Mouth/Throat:     Mouth: Mucous membranes are moist.  Eyes:     Extraocular Movements: Extraocular movements intact.  Pupils: Pupils are equal, round, and reactive to light.  Cardiovascular:     Rate and Rhythm: Normal rate and regular rhythm.     Pulses: Normal pulses.     Heart sounds: Normal heart sounds.  Pulmonary:     Effort: Pulmonary effort is normal.     Breath sounds: Normal breath sounds.  Abdominal:     General: Abdomen is flat.     Palpations: Abdomen is soft.  Musculoskeletal:        General: Normal range of motion.     Cervical back: Normal range of motion and neck supple.     Comments: Back brace on  Skin:    General: Skin is warm.     Capillary Refill: Capillary refill takes less than 2 seconds.  Neurological:     General: No focal deficit present.     Mental Status: He is oriented to person, place, and time.  Psychiatric:        Mood and Affect: Mood normal.        Behavior: Behavior normal.     ED Results / Procedures / Treatments   Labs (all labs ordered are listed, but only abnormal results are displayed) Labs Reviewed - No data to display  EKG None  Radiology No results found.  Procedures Procedures    Medications Ordered in ED Medications  HYDROmorphone (DILAUDID) injection 2 mg (has no administration in time range)    ED Course/ Medical Decision Making/ A&P                                 Medical Decision Making Zayid Kroeker is a 62 y.o. male here presenting with chronic back pain.  Patient  has no saddle anesthesia.  Patient has a back brace on.  Patient does has history of epidural abscess and had surgery.  I told him that I will do a short refill of his Norco.  Patient needs to follow-up with pain management   Problems Addressed: Chronic midline low back pain without sciatica: chronic illness or injury  Risk Prescription drug management.    Final Clinical Impression(s) / ED Diagnoses Final diagnoses:  None    Rx / DC Orders ED Discharge Orders     None         Charlynne Pander, MD 01/24/23 2035

## 2023-01-24 NOTE — Discharge Instructions (Addendum)
You have back pain and I have refilled 15 tablets of your Norco.  You need to see pain management for your chronic opiate prescription  Follow-up with your infectious disease doctor and primary care doctor  Return to ER if you have worse back pain, weakness or numbness

## 2023-01-24 NOTE — ED Triage Notes (Signed)
Pt reports:  Back pain Chronic Out of medication

## 2023-01-24 NOTE — Telephone Encounter (Signed)
At his visit to establish care 3 weeks ago, he stated his spine specialist was managing his pain medication and I added Cymbalta to his regimen for better pain control.  His spine specialist can provide him with narcotic analgesics until his appointment with pain clinic in a few days.

## 2023-01-25 ENCOUNTER — Ambulatory Visit: Payer: Medicaid Other | Admitting: Physical Therapy

## 2023-01-25 ENCOUNTER — Other Ambulatory Visit (HOSPITAL_COMMUNITY): Payer: Self-pay

## 2023-01-25 ENCOUNTER — Encounter: Payer: Self-pay | Admitting: Physical Therapy

## 2023-01-25 DIAGNOSIS — M6281 Muscle weakness (generalized): Secondary | ICD-10-CM

## 2023-01-25 DIAGNOSIS — R2689 Other abnormalities of gait and mobility: Secondary | ICD-10-CM

## 2023-01-25 DIAGNOSIS — R262 Difficulty in walking, not elsewhere classified: Secondary | ICD-10-CM

## 2023-01-25 DIAGNOSIS — R2681 Unsteadiness on feet: Secondary | ICD-10-CM

## 2023-01-25 NOTE — Therapy (Signed)
OUTPATIENT PHYSICAL THERAPY NEURO PROGRESS NOTE/RE-CERT   Patient Name: David [name] MRN: [id number] DOB:September 09, 1960, 62 y.o., male Today's Date: 01/25/2023   PCP: [name], MD REFERRING PROVIDER: Eleonore Chiquito, FNP  END OF SESSION:  PT End of Session - 01/25/23 1051     Visit Number 8    Number of Visits 16    Date for PT Re-Evaluation 02/22/23    Authorization Type [employer]    Authorization - Visit Number 7    Authorization - Number of Visits 27    PT Start Time 1027   pt late   PT Stop Time 1051   pt requested to end session early   PT Time Calculation (min) 24 min    Equipment Utilized During Treatment Gait belt    Activity Tolerance Patient tolerated treatment well    Behavior During Therapy WFL for tasks assessed/performed               Past Medical History:  Diagnosis Date   Cardiomyopathy (HCC)    a. EF 45% in 2019.   CHF (congestive heart failure) (HCC)    Chronic anticoagulation 09/30/2017   Diabetes mellitus type 2 in nonobese [facility])    Discitis of thoracic region 09/25/2022   Does not have health insurance    Essential hypertension 09/26/2017   Financial difficulties    H/O noncompliance with medical treatment, presenting hazards to health    Hardware complicating wound infection (HCC) 09/25/2022   Non-insulin treated type 2 diabetes mellitus (HCC) 09/26/2017   Persistent atrial fibrillation (HCC)    Sleep apnea suspected 09/30/2017   Past Surgical History:  Procedure Laterality Date   ABDOMINAL AORTOGRAM W/LOWER EXTREMITY N/A 09/02/2021   Procedure: ABDOMINAL AORTOGRAM W/LOWER EXTREMITY;  Surgeon: Nada Libman, MD;  [facility]: [facility];  Service: Cardiovascular;  Laterality: N/A;   ABDOMINAL AORTOGRAM W/LOWER EXTREMITY N/A 12/28/2021   Procedure: ABDOMINAL AORTOGRAM W/LOWER EXTREMITY;  Surgeon: Nada Libman, MD;  Location: [facility];  Service: Cardiovascular;  Laterality: N/A;   ABDOMINAL  AORTOGRAM W/LOWER EXTREMITY N/A 07/28/2022   Procedure: ABDOMINAL AORTOGRAM W/LOWER EXTREMITY;  Surgeon: Cephus Shelling, MD;  Location: [facility];  Service: Cardiovascular;  Laterality: N/A;   AMPUTATION Right 09/04/2021   Procedure: 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;  Surgeon: Nadara Mustard, MD;  Location: [facility] OR;  Service: Orthopedics;  Laterality: Right;   AMPUTATION Right 08/02/2022   Procedure: AMPUTATION RAY 5TH METATARSAL OF RIGHT FOOT;  Surgeon: [name], MD;  Location: [facility] OR;  Service: Orthopedics;  Laterality: Right;   APPENDECTOMY  1971   BONE BIOPSY Right 08/02/2022   Procedure: BONE BIOPSY OF 5TH METATARSAL RIGHT FOOT;  Surgeon: [name], MD;  Location: [facility];  Service: Orthopedics;  Laterality: Right;   CARDIOVERSION N/A 09/28/2017   Procedure: CARDIOVERSION;  Surgeon: Thurmon Fair, MD;  Location: [facility];  Service: Cardiovascular;  Laterality: N/A;   IRRIGATION AND DEBRIDEMENT FOOT  08/02/2022   Procedure: IRRIGATION AND DEBRIDEMENT RIGHT 5TH METATARSAL FOOT;  Surgeon: [name], MD;  Location: [facility];  Service: Orthopedics;;   LAMINECTOMY WITH POSTERIOR LATERAL ARTHRODESIS LEVEL 4 N/A 08/30/2022   Procedure: THORACIC FIVE-THORACIC ELEVEN FUSION, THORACIC EIGHT TRANSPEDICULAR DECOMPRESSION AND PARTIAL CORPECTOMY with O-Arm;  Surgeon: Bedelia Person, MD;  Location: [facility] OR;  Service: Neurosurgery;  Laterality: N/A;   PERIPHERAL VASCULAR BALLOON ANGIOPLASTY  09/02/2021   Procedure: PERIPHERAL VASCULAR BALLOON ANGIOPLASTY;  Surgeon: Nada Libman, MD;  [facility];  Service: Cardiovascular;;   PERIPHERAL VASCULAR BALLOON ANGIOPLASTY  12/28/2021   Procedure: PERIPHERAL VASCULAR BALLOON ANGIOPLASTY;  Surgeon: Nada Libman, MD;  [facility];  Service: Cardiovascular;;  Posterior Tibial PTA only   TEE WITHOUT CARDIOVERSION N/A 09/28/2017   Procedure: TRANSESOPHAGEAL ECHOCARDIOGRAM (TEE);  Surgeon: Thurmon Fair,  MD;  Location: [facility] ENDOSCOPY;  Service: Cardiovascular;  Laterality: N/A;   TEE WITHOUT CARDIOVERSION N/A 09/03/2021   Procedure: TRANSESOPHAGEAL ECHOCARDIOGRAM (TEE);  Surgeon: Chilton Si, MD;  Location: [facility] ENDOSCOPY;  Service: Cardiovascular;  Laterality: N/A;   TEE WITHOUT CARDIOVERSION N/A 07/15/2022   Procedure: TRANSESOPHAGEAL ECHOCARDIOGRAM;  Surgeon: Thurmon Fair, MD;  [facility];  Service: Cardiovascular;  Laterality: N/A;   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 08/08/2022   MRSA bacteremia 08/08/2022   AKI (acute kidney injury), ruled out 07/27/2022   Discitis thoracic region 07/27/2022   Bacteremia due to methicillin resistant Staphylococcus aureus 07/27/2022   Severe protein-calorie malnutrition (HCC) 07/26/2022   PVD (peripheral vascular disease) (HCC) 07/26/2022   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   Bacteremia 07/15/2022   Acute respiratory failure with hypoxia (HCC) 07/12/2022   Atelectasis 07/12/2022   Sepsis due to pneumonia (HCC) 07/12/2022   Gangrene of toe of right foot (HCC)    Sepsis (HCC) 08/29/2021   SIRS (systemic inflammatory response syndrome) (HCC) 08/29/2021   Hyponatremia 08/29/2021   Hypomagnesemia 08/29/2021   Pure hypercholesterolemia 06/17/2021   Pain of left hand 09/06/2019   Cellulitis 08/26/2019   Persistent atrial fibrillation (HCC) 08/26/2019    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) 10/18/2018   Transaminitis 10/17/2018   Hyperbilirubinemia 10/17/2018   Leukocytosis 10/17/2018   Sinusitis 10/17/2018   Chronic anticoagulation 09/30/2017   Smoker 09/30/2017   Cardiomyopathy (HCC) 09/30/2017   Sleep apnea suspected 09/30/2017   Essential hypertension 09/26/2017   Uncontrolled type 2 diabetes with hyperglycemia and hypoglycemia, insulin long term use 09/26/2017   Atrial fibrillation with RVR (HCC) 09/26/2017    ONSET DATE: 08/20/22  REFERRING DIAG: M46.44ICD-10-CMDiscitis of thoracic region  THERAPY DIAG:  Unsteadiness on feet  Difficulty in walking, not elsewhere classified  Muscle weakness (generalized)  Other abnormalities of gait and mobility  Rationale for Evaluation and Treatment: Rehabilitation  SUBJECTIVE:  SUBJECTIVE STATEMENT: Tried to get refill of hydrocodone from his Spine MD, who instructed him to get it from PCP and they would not refill it. Thus, went to ED to get his medicine. Has an appointment with pain management Monday.   Pt accompanied by: self  PERTINENT HISTORY:  Per patient, he was admitted to the local hospital on Aug 20, 2022 and had back surgery thereafter. Patient was then readmitted after being discharged for infection with low BG. He was discharged on October 28, 2022 on oral antibiotics with changes to his insulin regimen. past medical history of PVD, type 2 diabetes with hyperglycemia/hypoglycemia, seizure disorders, hypertension, hyperlipidemia, A-fib (on Eliquis) and recent bacteremia.  He is s/p angioplasty.  Patient reports since discharge he has been doing physical therapy/exercises at home but continues to have severe back pain    PAIN:  Are you having pain? Yes: NPRS scale:  7-8/10 Pain location: upper back and radiating Pain description: stabbing Aggravating factors: activity and with rest Relieving factors: pain medication  PRECAUTIONS: Back  RED FLAGS:    WEIGHT BEARING RESTRICTIONS: No  FALLS: Has patient fallen in last 6 months? No  LIVING ENVIRONMENT: Lives with: lives with their spouse Lives in: House/apartment, pt reports housing issues since out of work Stairs: No Has following equipment at home: Environmental consultant - 2 wheeled  PLOF: Independent, previously worked as Photographer  PATIENT GOALS: be able to return to normal, walking without AD  OBJECTIVE:     TODAY'S TREATMENT: 01/25/23  LOWER EXTREMITY MMT:    MMT Right Eval Left Eval Right 01/25/23 Left 01/25/23  Hip flexion 3+ 3+ 4 4  Hip extension      Hip abduction 3+ 3+ 3+ 3+  Hip adduction 4 4 4 4   Hip internal rotation      Hip external rotation      Knee flexion 4+ 4 4 4-  Knee extension 5 4- 4+ 4+  Ankle dorsiflexion 4+ 3+ 4 3+  Ankle plantarflexion 3+ 3+ 4 3+  Ankle inversion      Ankle eversion      (Blank rows = not tested)  Activity Comments  TUG 13.69 sec with RW; large quick turn  Berg 44/56                 PATIENT EDUCATION: Education details: discussion on objective progress and remaining impairments , POC Person educated: Patient Education method: Explanation, Demonstration, Tactile cues, and Verbal cues Education comprehension: verbalized understanding    HOME EXERCISE PROGRAM: Access Code: [email or web address] URL: [email or web address] Date: 01/10/2023 Prepared by: [facility] - Outpatient  Rehab - [facility]  Exercises - Seated Knee Extension with Resistance  - 1 x daily - 7 x weekly - 3 sets - 10 reps - Seated Hamstring Curls with Resistance  - 1 x daily - 7 x weekly - 3 sets - 10 reps - Seated Hip Abduction with Resistance  - 1 x daily - 7 x weekly - 3 sets - 10 reps - Seated Hip Adduction Isometrics with Ball  - 1 x daily - 7  x weekly - 3 sets - 10 reps - Hooklying Clamshell with Resistance  - 1 x daily - 7 x weekly - 3 sets - 10 reps - Seated Shoulder Horizontal Abduction with Resistance  - 1 x daily - 7 x weekly - 3 sets - 10 reps - Seated Shoulder Row with Resistance Anchored at Feet  - 1 x daily - 7 x weekly - 3  sets - 10 reps - Alternating Step Taps with Counter Support  - 1 x daily - 5 x weekly - 2 sets - 10 reps - Standing Gastroc Stretch at Counter  - 1-2 x daily - 7 x weekly - 1 sets - 3 reps - 30 sec hold  ---------------------------------------------------------------------- DIAGNOSTIC FINDINGS: IMPRESSION: 1. Significantly regressed bilateral pleural effusions and improved bilateral ventilation since last month. 2. No new cardiopulmonary abnormality  COGNITION: Overall cognitive status: Within functional limits for tasks assessed   SENSATION: Proprioception: Impaired  Decreased LLE > RLE COORDINATION: Minimal impairment LLE  EDEMA:  none  MUSCLE TONE: NT      POSTURE: No Significant postural limitations  LOWER EXTREMITY ROM:     Active  Right Eval Left Eval  Hip flexion    Hip extension    Hip abduction    Hip adduction    Hip internal rotation    Hip external rotation    Knee flexion 120 120  Knee extension 0 0  Ankle dorsiflexion 15 10  Ankle plantarflexion    Ankle inversion    Ankle eversion     (Blank rows = not tested)  LOWER EXTREMITY MMT:    MMT Right Eval Left Eval  Hip flexion 3+ 3+  Hip extension    Hip abduction 3+ 3+  Hip adduction 4 4  Hip internal rotation    Hip external rotation    Knee flexion 4+ 4  Knee extension 5 4-  Ankle dorsiflexion 4+ 3+  Ankle plantarflexion 3+ 3+  Ankle inversion    Ankle eversion    (Blank rows = not tested)  BED MOBILITY:  Modified indep due to back precautions  TRANSFERS: Assistive device utilized: Environmental consultant - 2 wheeled  Sit to stand: Modified independence Stand to sit: Modified independence Chair to  chair: Modified independence Floor:  NT    CURB:  Level of Assistance: SBA Assistive device utilized: Environmental consultant - 2 wheeled Curb Comments:   STAIRS: Level of Assistance: SBA Stair Negotiation Technique: Step to Pattern Alternating Pattern  with Bilateral Rails Number of Stairs: 8  Height of Stairs: 4-6"  Comments:   GAIT: Gait pattern: Left foot flat Distance walked:  Assistive device utilized: Walker - 2 wheeled Level of assistance: Modified independence Comments: ambulates with good stride, decreased speed. Without AD ambulates with past retracts  FUNCTIONAL TESTS:  5 times sit to stand: 25 sec w/ BUE Timed up and go (TUG): 19 sec  M-CTSIB  Condition 1: Firm Surface, EO 30 Sec, Moderate Sway  Condition 2: Firm Surface, EC 10 Sec,  left LOB  Sway  Condition 3: Foam Surface, EO  Sec,  Sway  Condition 4: Foam Surface, EC  Sec,  Sway         GOALS: Goals reviewed with patient? Yes  SHORT TERM GOALS: Target date: 01/11/2023    Patient will be independent in HEP to improve functional outcomes Baseline: Goal status: IN PROGRESS  2.  Patient will achieve 12 seconds for TUG test to manifest reduced risk for falls Baseline: 19 w/ RW>17.38 sec 01/10/2023; 13.69 sec 01/25/23 Goal status: PARTIALLY MET, 01/25/23  3.  Demo improved BLE strength and reduced risk for falls per time 15 sec 5xSTS test Baseline: 25 sec w/ BUE push-off; 14.66 sec with UE support at chair; 17.3 sec with hands on knees Goal status: MET, 01/10/2023  4. Demo modified independent stair ambulation with reciprocal pattern and single HR to improve community mobility  Baseline: SBA, BHR  Goal status: IN PROGRESS  LONG TERM GOALS: Target date: 02/22/2023    Demo low risk for falls per score 50/56 Berg Balance Test Baseline: 38/56; 44/56 01/25/23 Goal status: IN PROGRESS 01/25/23  2.  Demo independent ambulation level surfaces w/out AD x 150 ft to improve independence and safety with household  ambulation Baseline: dependent on RW; NT d/t time 01/25/23 Goal status: IN PROGRESS 01/25/23  3.  Demo gross BLE strength to 4+/5 to improve activity tolerance and stability for unsupported standing/gait Baseline: B hip flexion, L knee extension 01/25/23 Goal status: IN PROGRESS 01/25/23     ASSESSMENT:  CLINICAL IMPRESSION: Patient arrived to session s/p ED visit yesterday to get refill of his MD medication. Has an appointment with pain management Monday. Patient scored 44/56 on Berg, significantly improved from initial assessment. Strength testing revealed improvement in B hip flexion, L knee extension. Completed TUG testing in decreased time today; indicating a decreased risk of falls. Strength and balance is still limited; patient requested to finish session early d/t needing to take his sister's car back to her. Would benefit from additional skilled PT services 1-2x/week for 4 weeks to address remaining impairments.   OBJECTIVE IMPAIRMENTS: Abnormal gait, cardiopulmonary status limiting activity, decreased activity tolerance, decreased balance, decreased coordination, decreased endurance, decreased knowledge of use of DME, difficulty walking, decreased strength, impaired perceived functional ability, and pain.   ACTIVITY LIMITATIONS: carrying, lifting, bending, standing, squatting, stairs, transfers, bathing, dressing, reach over head, and locomotion level  PARTICIPATION LIMITATIONS: meal prep, cleaning, laundry, driving, shopping, community activity, occupation, and yard work  PERSONAL FACTORS: Time since onset of injury/illness/exacerbation and 1-2 comorbidities: PMH  are also affecting patient's functional outcome.   REHAB POTENTIAL: Excellent  CLINICAL DECISION MAKING: Evolving/moderate complexity  EVALUATION COMPLEXITY: Moderate  PLAN:  PT FREQUENCY: 1-2x/week  PT DURATION: 4 weeks  PLANNED INTERVENTIONS: Therapeutic exercises, Therapeutic activity, Neuromuscular  re-education, Balance training, Gait training, Patient/Family education, Self Care, Joint mobilization, Stair training, Vestibular training, Canalith repositioning, Orthotic/Fit training, DME instructions, Aquatic Therapy, Electrical stimulation, Cryotherapy, Moist heat, and Manual therapy  PLAN FOR NEXT SESSION:  Review updates to HEP and finish assessing STGs.  Try step ups/step downs (?) for strengthening, corner balance activities, general BLE strength.  Gait training with cane    Anette Guarneri, PT, DPT 01/25/23 10:56 AM  [facility] at [facility] [address] [place], [address] [place], [place] [address] Phone # [phone] Fax # [phone]

## 2023-01-25 NOTE — Telephone Encounter (Signed)
Call placed to patient unable to reach message left on VM.   

## 2023-01-26 ENCOUNTER — Encounter: Payer: Self-pay | Admitting: Physical Therapy

## 2023-01-26 ENCOUNTER — Ambulatory Visit: Payer: Medicaid Other | Admitting: Physical Therapy

## 2023-01-26 DIAGNOSIS — M6281 Muscle weakness (generalized): Secondary | ICD-10-CM

## 2023-01-26 DIAGNOSIS — R2681 Unsteadiness on feet: Secondary | ICD-10-CM

## 2023-01-26 DIAGNOSIS — R262 Difficulty in walking, not elsewhere classified: Secondary | ICD-10-CM

## 2023-01-26 DIAGNOSIS — R2689 Other abnormalities of gait and mobility: Secondary | ICD-10-CM

## 2023-01-26 NOTE — Therapy (Signed)
OUTPATIENT PHYSICAL THERAPY   Patient Name: David [name] MRN: [id number] DOB:21-Jun-1960, 62 y.o., male Today's Date: 01/26/2023   PCP: [name], MD REFERRING PROVIDER: Eleonore Chiquito, FNP  END OF SESSION:  PT End of Session - 01/26/23 1606     Visit Number 9    Number of Visits 16    Date for PT Re-Evaluation 02/22/23    Authorization Type [employer]    Authorization - Visit Number 8    Authorization - Number of Visits 27    PT Start Time 1141    PT Stop Time 1228    PT Time Calculation (min) 47 min    Equipment Utilized During Treatment Gait belt    Activity Tolerance Patient tolerated treatment well    Behavior During Therapy WFL for tasks assessed/performed               Past Medical History:  Diagnosis Date   Cardiomyopathy (HCC)    a. EF 45% in 2019.   CHF (congestive heart failure) (HCC)    Chronic anticoagulation 09/30/2017   Diabetes mellitus type 2 in nonobese [facility])    Discitis of thoracic region 09/25/2022   Does not have health insurance    Essential hypertension 09/26/2017   Financial difficulties    H/O noncompliance with medical treatment, presenting hazards to health    Hardware complicating wound infection (HCC) 09/25/2022   Non-insulin treated type 2 diabetes mellitus (HCC) 09/26/2017   Persistent atrial fibrillation (HCC)    Sleep apnea suspected 09/30/2017   Past Surgical History:  Procedure Laterality Date   ABDOMINAL AORTOGRAM W/LOWER EXTREMITY N/A 09/02/2021   Procedure: ABDOMINAL AORTOGRAM W/LOWER EXTREMITY;  Surgeon: Nada Libman, MD;  Location: [facility];  Service: Cardiovascular;  Laterality: N/A;   ABDOMINAL AORTOGRAM W/LOWER EXTREMITY N/A 12/28/2021   Procedure: ABDOMINAL AORTOGRAM W/LOWER EXTREMITY;  Surgeon: Nada Libman, MD;  Location: [facility];  Service: Cardiovascular;  Laterality: N/A;   ABDOMINAL AORTOGRAM W/LOWER EXTREMITY N/A 07/28/2022   Procedure: ABDOMINAL AORTOGRAM  W/LOWER EXTREMITY;  Surgeon: Cephus Shelling, MD;  Location: [facility];  Service: Cardiovascular;  Laterality: N/A;   AMPUTATION Right 09/04/2021   Procedure: 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;  Surgeon: Nadara Mustard, MD;  Location: [facility] OR;  Service: Orthopedics;  Laterality: Right;   AMPUTATION Right 08/02/2022   Procedure: AMPUTATION RAY 5TH METATARSAL OF RIGHT FOOT;  Surgeon: [name], MD;  Location: [facility] OR;  Service: Orthopedics;  Laterality: Right;   APPENDECTOMY  1971   BONE BIOPSY Right 08/02/2022   Procedure: BONE BIOPSY OF 5TH METATARSAL RIGHT FOOT;  Surgeon: [name], MD;  Location: [facility] OR;  Service: Orthopedics;  Laterality: Right;   CARDIOVERSION N/A 09/28/2017   Procedure: CARDIOVERSION;  Surgeon: Thurmon Fair, MD;  Location: [facility];  Service: Cardiovascular;  Laterality: N/A;   IRRIGATION AND DEBRIDEMENT FOOT  08/02/2022   Procedure: IRRIGATION AND DEBRIDEMENT RIGHT 5TH METATARSAL FOOT;  Surgeon: [name], MD;  Location: [facility] OR;  Service: Orthopedics;;   LAMINECTOMY WITH POSTERIOR LATERAL ARTHRODESIS LEVEL 4 N/A 08/30/2022   Procedure: THORACIC FIVE-THORACIC ELEVEN FUSION, THORACIC EIGHT TRANSPEDICULAR DECOMPRESSION AND PARTIAL CORPECTOMY with O-Arm;  Surgeon: Bedelia Person, MD;  Location: [facility] OR;  Service: Neurosurgery;  Laterality: N/A;   PERIPHERAL VASCULAR BALLOON ANGIOPLASTY  09/02/2021   Procedure: PERIPHERAL VASCULAR BALLOON ANGIOPLASTY;  Surgeon: Nada Libman, MD;  [facility]: [facility];  Service: Cardiovascular;;   PERIPHERAL VASCULAR BALLOON ANGIOPLASTY  12/28/2021  Procedure: PERIPHERAL VASCULAR BALLOON ANGIOPLASTY;  Surgeon: Nada Libman, MD;  [facility]: [facility];  Service: Cardiovascular;;  Posterior Tibial PTA only   TEE WITHOUT CARDIOVERSION N/A 09/28/2017   Procedure: TRANSESOPHAGEAL ECHOCARDIOGRAM (TEE);  Surgeon: Thurmon Fair, MD;  Location: [facility] ENDOSCOPY;  Service: Cardiovascular;  Laterality: N/A;    TEE WITHOUT CARDIOVERSION N/A 09/03/2021   Procedure: TRANSESOPHAGEAL ECHOCARDIOGRAM (TEE);  Surgeon: Chilton Si, MD;  Location: [facility] ENDOSCOPY;  Service: Cardiovascular;  Laterality: N/A;   TEE WITHOUT CARDIOVERSION N/A 07/15/2022   Procedure: TRANSESOPHAGEAL ECHOCARDIOGRAM;  Surgeon: Thurmon Fair, MD;  [facility]: [facility];  Service: Cardiovascular;  Laterality: N/A;   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 08/08/2022   MRSA bacteremia 08/08/2022   AKI (acute kidney injury), ruled out 07/27/2022   Discitis thoracic region 07/27/2022   Bacteremia due to methicillin resistant Staphylococcus aureus 07/27/2022   Severe protein-calorie malnutrition (HCC) 07/26/2022   PVD (peripheral vascular disease) (HCC) 07/26/2022   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   Bacteremia 07/15/2022   Acute respiratory failure with hypoxia (HCC) 07/12/2022   Atelectasis 07/12/2022   Sepsis due to pneumonia (HCC) 07/12/2022   Gangrene of toe of right foot (HCC)    Sepsis (HCC) 08/29/2021   SIRS (systemic inflammatory response syndrome) (HCC) 08/29/2021   Hyponatremia 08/29/2021   Hypomagnesemia 08/29/2021   Pure hypercholesterolemia 06/17/2021   Pain of left hand 09/06/2019   Cellulitis 08/26/2019   Persistent atrial fibrillation (HCC) 08/26/2019   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) 10/18/2018    Transaminitis 10/17/2018   Hyperbilirubinemia 10/17/2018   Leukocytosis 10/17/2018   Sinusitis 10/17/2018   Chronic anticoagulation 09/30/2017   Smoker 09/30/2017   Cardiomyopathy (HCC) 09/30/2017   Sleep apnea suspected 09/30/2017   Essential hypertension 09/26/2017   Uncontrolled type 2 diabetes with hyperglycemia and hypoglycemia, insulin long term use 09/26/2017   Atrial fibrillation with RVR (HCC) 09/26/2017    ONSET DATE: 08/20/22  REFERRING DIAG: M46.44ICD-10-CMDiscitis of thoracic region  THERAPY DIAG:  Unsteadiness on feet  Difficulty in walking, not elsewhere classified  Muscle weakness (generalized)  Other abnormalities of gait and mobility  Rationale for Evaluation and Treatment: Rehabilitation  SUBJECTIVE:  SUBJECTIVE STATEMENT: Patient with ongoing back pain, denies falls.  States he may have to change his Thursday pool appt next week as he has another obligation.  Pt accompanied by: self  PERTINENT HISTORY:  Per patient, he was admitted to the local hospital on Aug 20, 2022 and had back surgery thereafter. Patient was then readmitted after being discharged for infection with low BG. He was discharged on October 28, 2022 on oral antibiotics with changes to his insulin regimen. past medical history of PVD, type 2 diabetes with hyperglycemia/hypoglycemia, seizure disorders, hypertension, hyperlipidemia, A-fib (on Eliquis) and recent bacteremia.  He is s/p angioplasty.  Patient reports since discharge he has been doing physical therapy/exercises at home but continues to have severe back pain    PAIN:  Are you having pain? Yes: NPRS scale: 6/10 Pain location: midline back Pain description: stabbing Aggravating factors: activity and with rest Relieving factors: pain  medication  PRECAUTIONS: Back  RED FLAGS:    WEIGHT BEARING RESTRICTIONS: No  FALLS: Has patient fallen in last 6 months? No  LIVING ENVIRONMENT: Lives with: lives with their spouse Lives in: House/apartment, pt reports housing issues since out of work Stairs: No Has following equipment at home: Environmental consultant - 2 wheeled  PLOF: Independent, previously worked as Photographer  PATIENT GOALS: be able to return to normal, walking without AD  OBJECTIVE:     TODAY'S TREATMENT: 01/26/23  Aquatic therapy at [facility] - pool temperature 91 degrees   Patient seen for aquatic therapy today.  Treatment took place in water 3.6-4.8 feet deep depending upon activity.  Patient entered and exited the pool via step-to pattern of stair negotiation w/ bilateral rails at [facility] level.   Exercises: Water walking warmup:  forwards, laterally, and backwards 4x18 ft each without floatation device CGA -STS x20 w/o UE support for improved anterior weight shift and glut engagement -LAQ w/ 5.5 lb ankle weights x30 alternating LE -Standing hip 3-way x20 each LE w/ 5.5 lb ankle weight -Walking marches w/ yellow noodle support and ankle weights 5x20 ft at edge of wall -Deep squats 2x20 using wall support, pt facilitating posterior weight shift and cuing for maintaining heels on ground -tandem walking at wall w/ unilateral support 5x18 ft -tandem stance CGA no UE support 4x1 minute alt LE in rear, more difficulty with LLE in rear  Patient requires buoyancy of the water for support for reduced fall risk with gait training and balance exercises with CGA support. Exercises able to be performed safely in water without the risk of fall compared to those same exercises performed on land; viscosity of water needed for resistance for strengthening. Current of water provides perturbations for challenging static and dynamic balance.   PATIENT EDUCATION: Education details: Focus of aquatic session. Person educated:  Patient Education method: Explanation, Demonstration, Tactile cues, and Verbal cues Education comprehension: verbalized understanding    HOME EXERCISE PROGRAM: Access Code: [email or web address] URL: [email or web address] Date: 01/10/2023 Prepared by: [facility]  Exercises - Seated Knee Extension with Resistance  - 1 x daily - 7 x weekly - 3 sets - 10 reps - Seated Hamstring Curls with Resistance  - 1 x daily - 7 x weekly - 3 sets - 10 reps - Seated Hip Abduction with Resistance  - 1 x daily - 7 x weekly - 3 sets - 10 reps - Seated Hip Adduction Isometrics with Ball  - 1 x daily - 7 x weekly - 3 sets - 10  reps - Hooklying Clamshell with Resistance  - 1 x daily - 7 x weekly - 3 sets - 10 reps - Seated Shoulder Horizontal Abduction with Resistance  - 1 x daily - 7 x weekly - 3 sets - 10 reps - Seated Shoulder Row with Resistance Anchored at Feet  - 1 x daily - 7 x weekly - 3 sets - 10 reps - Alternating Step Taps with Counter Support  - 1 x daily - 5 x weekly - 2 sets - 10 reps - Standing Gastroc Stretch at Counter  - 1-2 x daily - 7 x weekly - 1 sets - 3 reps - 30 sec hold  ---------------------------------------------------------------------- DIAGNOSTIC FINDINGS: IMPRESSION: 1. Significantly regressed bilateral pleural effusions and improved bilateral ventilation since last month. 2. No new cardiopulmonary abnormality  COGNITION: Overall cognitive status: Within functional limits for tasks assessed   SENSATION: Proprioception: Impaired  Decreased LLE > RLE COORDINATION: Minimal impairment LLE  EDEMA:  none  MUSCLE TONE: NT      POSTURE: No Significant postural limitations  LOWER EXTREMITY ROM:     Active  Right Eval Left Eval  Hip flexion    Hip extension    Hip abduction    Hip adduction    Hip internal rotation    Hip external rotation    Knee flexion 120 120  Knee extension 0 0  Ankle dorsiflexion 15 10  Ankle  plantarflexion    Ankle inversion    Ankle eversion     (Blank rows = not tested)  LOWER EXTREMITY MMT:    MMT Right Eval Left Eval  Hip flexion 3+ 3+  Hip extension    Hip abduction 3+ 3+  Hip adduction 4 4  Hip internal rotation    Hip external rotation    Knee flexion 4+ 4  Knee extension 5 4-  Ankle dorsiflexion 4+ 3+  Ankle plantarflexion 3+ 3+  Ankle inversion    Ankle eversion    (Blank rows = not tested)  BED MOBILITY:  Modified indep due to back precautions  TRANSFERS: Assistive device utilized: Environmental consultant - 2 wheeled  Sit to stand: Modified independence Stand to sit: Modified independence Chair to chair: Modified independence Floor:  NT    CURB:  Level of Assistance: SBA Assistive device utilized: Environmental consultant - 2 wheeled Curb Comments:   STAIRS: Level of Assistance: SBA Stair Negotiation Technique: Step to Pattern Alternating Pattern  with Bilateral Rails Number of Stairs: 8  Height of Stairs: 4-6"  Comments:   GAIT: Gait pattern: Left foot flat Distance walked:  Assistive device utilized: Walker - 2 wheeled Level of assistance: Modified independence Comments: ambulates with good stride, decreased speed. Without AD ambulates with past retracts  FUNCTIONAL TESTS:  5 times sit to stand: 25 sec w/ BUE Timed up and go (TUG): 19 sec  M-CTSIB  Condition 1: Firm Surface, EO 30 Sec, Moderate Sway  Condition 2: Firm Surface, EC 10 Sec,  left LOB  Sway  Condition 3: Foam Surface, EO  Sec,  Sway  Condition 4: Foam Surface, EC  Sec,  Sway         GOALS: Goals reviewed with patient? Yes  SHORT TERM GOALS: Target date: 01/11/2023    Patient will be independent in HEP to improve functional outcomes Baseline: Goal status: IN PROGRESS  2.  Patient will achieve 12 seconds for TUG test to manifest reduced risk for falls Baseline: 19 w/ RW>17.38 sec 01/10/2023; 13.69 sec 01/25/23 Goal status: PARTIALLY MET,  01/25/23  3.  Demo improved BLE strength and  reduced risk for falls per time 15 sec 5xSTS test Baseline: 25 sec w/ BUE push-off; 14.66 sec with UE support at chair; 17.3 sec with hands on knees Goal status: MET, 01/10/2023  4. Demo modified independent stair ambulation with reciprocal pattern and single HR to improve community mobility  Baseline: SBA, BHR  Goal status: IN PROGRESS  LONG TERM GOALS: Target date: 02/22/2023    Demo low risk for falls per score 50/56 Berg Balance Test Baseline: 38/56; 44/56 01/25/23 Goal status: IN PROGRESS 01/25/23  2.  Demo independent ambulation level surfaces w/out AD x 150 ft to improve independence and safety with household ambulation Baseline: dependent on RW; NT d/t time 01/25/23 Goal status: IN PROGRESS 01/25/23  3.  Demo gross BLE strength to 4+/5 to improve activity tolerance and stability for unsupported standing/gait Baseline: B hip flexion, L knee extension 01/25/23 Goal status: IN PROGRESS 01/25/23     ASSESSMENT:  CLINICAL IMPRESSION: Patient tolerating aquatic therapy well this visit with continued strengthening progression and balance focus.  He was most challenged by tandem tasks at end of session.  To therapist surprise he had more difficulty with LLE in rear tandem than right.  He continues to benefit from skilled PT, particularly in pool setting, to further address high level strength and stability with better pain management than on land.  Will continue per POC.  OBJECTIVE IMPAIRMENTS: Abnormal gait, cardiopulmonary status limiting activity, decreased activity tolerance, decreased balance, decreased coordination, decreased endurance, decreased knowledge of use of DME, difficulty walking, decreased strength, impaired perceived functional ability, and pain.   ACTIVITY LIMITATIONS: carrying, lifting, bending, standing, squatting, stairs, transfers, bathing, dressing, reach over head, and locomotion level  PARTICIPATION LIMITATIONS: meal prep, cleaning, laundry, driving,  shopping, community activity, occupation, and yard work  PERSONAL FACTORS: Time since onset of injury/illness/exacerbation and 1-2 comorbidities: PMH  are also affecting patient's functional outcome.   REHAB POTENTIAL: Excellent  CLINICAL DECISION MAKING: Evolving/moderate complexity  EVALUATION COMPLEXITY: Moderate  PLAN:  PT FREQUENCY: 1-2x/week  PT DURATION: 4 weeks  PLANNED INTERVENTIONS: Therapeutic exercises, Therapeutic activity, Neuromuscular re-education, Balance training, Gait training, Patient/Family education, Self Care, Joint mobilization, Stair training, Vestibular training, Canalith repositioning, Orthotic/Fit training, DME instructions, Aquatic Therapy, Electrical stimulation, Cryotherapy, Moist heat, and Manual therapy  PLAN FOR NEXT SESSION:  Review updates to HEP and finish assessing STGs.  Try step ups/step downs (?) for strengthening, corner balance activities, general BLE strength.  Gait training with cane    Camille Bal, PT, DPT 01/26/23 4:08 PM

## 2023-01-27 ENCOUNTER — Other Ambulatory Visit (HOSPITAL_COMMUNITY): Payer: Self-pay

## 2023-01-30 ENCOUNTER — Ambulatory Visit: Payer: Medicaid Other

## 2023-01-30 DIAGNOSIS — R2689 Other abnormalities of gait and mobility: Secondary | ICD-10-CM

## 2023-01-30 DIAGNOSIS — R262 Difficulty in walking, not elsewhere classified: Secondary | ICD-10-CM

## 2023-01-30 DIAGNOSIS — M6281 Muscle weakness (generalized): Secondary | ICD-10-CM

## 2023-01-30 DIAGNOSIS — R2681 Unsteadiness on feet: Secondary | ICD-10-CM

## 2023-01-30 NOTE — Therapy (Signed)
OUTPATIENT PHYSICAL THERAPY   Patient Name: David [name] MRN: [id number] DOB:[birth year], 62 y.o., male Today's Date: 01/30/2023   PCP: [name], MD REFERRING PROVIDER: Eleonore Chiquito, FNP  END OF SESSION:  PT End of Session - 01/30/23 1146     Visit Number 10    Number of Visits 16    Date for PT Re-Evaluation 02/22/23    Authorization Type Medicaid-[employer]    Authorization - Visit Number 9    Authorization - Number of Visits 27    PT Start Time 1145    PT Stop Time 1230    PT Time Calculation (min) 45 min    Equipment Utilized During Treatment Gait belt    Activity Tolerance Patient tolerated treatment well    Behavior During Therapy WFL for tasks assessed/performed               Past Medical History:  Diagnosis Date   Cardiomyopathy (HCC)    a. EF 45% in 2019.   CHF (congestive heart failure) (HCC)    Chronic anticoagulation 09/30/2017   Diabetes mellitus type 2 in nonobese [facility])    Discitis of thoracic region 09/25/2022   Does not have health insurance    Essential hypertension 09/26/2017   Financial difficulties    H/O noncompliance with medical treatment, presenting hazards to health    Hardware complicating wound infection (HCC) 09/25/2022   Non-insulin treated type 2 diabetes mellitus (HCC) 09/26/2017   Persistent atrial fibrillation (HCC)    Sleep apnea suspected 09/30/2017   Past Surgical History:  Procedure Laterality Date   ABDOMINAL AORTOGRAM W/LOWER EXTREMITY N/A 09/02/2021   Procedure: ABDOMINAL AORTOGRAM W/LOWER EXTREMITY;  Surgeon: Nada Libman, MD;  [facility];  Service: Cardiovascular;  Laterality: N/A;   ABDOMINAL AORTOGRAM W/LOWER EXTREMITY N/A 12/28/2021   Procedure: ABDOMINAL AORTOGRAM W/LOWER EXTREMITY;  Surgeon: Nada Libman, MD;  [facility];  Service: Cardiovascular;  Laterality: N/A;   ABDOMINAL AORTOGRAM W/LOWER EXTREMITY N/A 07/28/2022   Procedure: ABDOMINAL AORTOGRAM  W/LOWER EXTREMITY;  Surgeon: Cephus Shelling, MD;  [facility];  Service: Cardiovascular;  Laterality: N/A;   AMPUTATION Right 09/04/2021   Procedure: 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;  Surgeon: Nadara Mustard, MD;  Location: [facility] OR;  Service: Orthopedics;  Laterality: Right;   AMPUTATION Right 08/02/2022   Procedure: AMPUTATION RAY 5TH METATARSAL OF RIGHT FOOT;  Surgeon: [name], MD;  Location: [facility] OR;  Service: Orthopedics;  Laterality: Right;   APPENDECTOMY  1971   BONE BIOPSY Right 08/02/2022   Procedure: BONE BIOPSY OF 5TH METATARSAL RIGHT FOOT;  Surgeon: [name], MD;  Location: [facility] OR;  Service: Orthopedics;  Laterality: Right;   CARDIOVERSION N/A 09/28/2017   Procedure: CARDIOVERSION;  Surgeon: Thurmon Fair, MD;  [facility]: [facility];  Service: Cardiovascular;  Laterality: N/A;   IRRIGATION AND DEBRIDEMENT FOOT  08/02/2022   Procedure: IRRIGATION AND DEBRIDEMENT RIGHT 5TH METATARSAL FOOT;  Surgeon: [name], MD;  Location: [facility];  Service: Orthopedics;;   LAMINECTOMY WITH POSTERIOR LATERAL ARTHRODESIS LEVEL 4 N/A 08/30/2022   Procedure: THORACIC FIVE-THORACIC ELEVEN FUSION, THORACIC EIGHT TRANSPEDICULAR DECOMPRESSION AND PARTIAL CORPECTOMY with O-Arm;  Surgeon: Bedelia Person, MD;  Location: [facility] OR;  Service: Neurosurgery;  Laterality: N/A;   PERIPHERAL VASCULAR BALLOON ANGIOPLASTY  09/02/2021   Procedure: PERIPHERAL VASCULAR BALLOON ANGIOPLASTY;  Surgeon: Nada Libman, MD;  [facility];  Service: Cardiovascular;;   PERIPHERAL VASCULAR BALLOON ANGIOPLASTY  12/28/2021  Procedure: PERIPHERAL VASCULAR BALLOON ANGIOPLASTY;  Surgeon: Nada Libman, MD;  [facility];  Service: Cardiovascular;;  Posterior Tibial PTA only   TEE WITHOUT CARDIOVERSION N/A 09/28/2017   Procedure: TRANSESOPHAGEAL ECHOCARDIOGRAM (TEE);  Surgeon: Thurmon Fair, MD;  Location: [facility] ENDOSCOPY;  Service: Cardiovascular;  Laterality: N/A;    TEE WITHOUT CARDIOVERSION N/A 09/03/2021   Procedure: TRANSESOPHAGEAL ECHOCARDIOGRAM (TEE);  Surgeon: Chilton Si, MD;  Location: [facility] ENDOSCOPY;  Service: Cardiovascular;  Laterality: N/A;   TEE WITHOUT CARDIOVERSION N/A 07/15/2022   Procedure: TRANSESOPHAGEAL ECHOCARDIOGRAM;  Surgeon: Thurmon Fair, MD;  [facility];  Service: Cardiovascular;  Laterality: N/A;   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 08/08/2022   MRSA bacteremia 08/08/2022   AKI (acute kidney injury), ruled out 07/27/2022   Discitis thoracic region 07/27/2022   Bacteremia due to methicillin resistant Staphylococcus aureus 07/27/2022   Severe protein-calorie malnutrition (HCC) 07/26/2022   PVD (peripheral vascular disease) (HCC) 07/26/2022   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   Bacteremia 07/15/2022   Acute respiratory failure with hypoxia (HCC) 07/12/2022   Atelectasis 07/12/2022   Sepsis due to pneumonia (HCC) 07/12/2022   Gangrene of toe of right foot (HCC)    Sepsis (HCC) 08/29/2021   SIRS (systemic inflammatory response syndrome) (HCC) 08/29/2021   Hyponatremia 08/29/2021   Hypomagnesemia 08/29/2021   Pure hypercholesterolemia 06/17/2021   Pain of left hand 09/06/2019   Cellulitis 08/26/2019   Persistent atrial fibrillation (HCC) 08/26/2019   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) 10/18/2018    Transaminitis 10/17/2018   Hyperbilirubinemia 10/17/2018   Leukocytosis 10/17/2018   Sinusitis 10/17/2018   Chronic anticoagulation 09/30/2017   Smoker 09/30/2017   Cardiomyopathy (HCC) 09/30/2017   Sleep apnea suspected 09/30/2017   Essential hypertension 09/26/2017   Uncontrolled type 2 diabetes with hyperglycemia and hypoglycemia, insulin long term use 09/26/2017   Atrial fibrillation with RVR (HCC) 09/26/2017    ONSET DATE: 08/20/22  REFERRING DIAG: M46.44ICD-10-CMDiscitis of thoracic region  THERAPY DIAG:  Unsteadiness on feet  Difficulty in walking, not elsewhere classified  Muscle weakness (generalized)  Other abnormalities of gait and mobility  Rationale for Evaluation and Treatment: Rehabilitation  SUBJECTIVE:  SUBJECTIVE STATEMENT: Doing better, back is feeling better.  Enjoying the aquatic PT, it's a good workout  Pt accompanied by: self  PERTINENT HISTORY:  Per patient, he was admitted to the local hospital on Aug 20, 2022 and had back surgery thereafter. Patient was then readmitted after being discharged for infection with low BG. He was discharged on October 28, 2022 on oral antibiotics with changes to his insulin regimen. past medical history of PVD, type 2 diabetes with hyperglycemia/hypoglycemia, seizure disorders, hypertension, hyperlipidemia, A-fib (on Eliquis) and recent bacteremia.  He is s/p angioplasty.  Patient reports since discharge he has been doing physical therapy/exercises at home but continues to have severe back pain    PAIN:  Are you having pain? Yes: NPRS scale: 6/10 Pain location: midline back Pain description: stabbing Aggravating factors: activity and with rest Relieving factors: pain medication  PRECAUTIONS: Back  RED FLAGS:    WEIGHT BEARING  RESTRICTIONS: No  FALLS: Has patient fallen in last 6 months? No  LIVING ENVIRONMENT: Lives with: lives with their spouse Lives in: House/apartment, pt reports housing issues since out of work Stairs: No Has following equipment at home: Environmental consultant - 2 wheeled  PLOF: Independent, previously worked as Photographer  PATIENT GOALS: be able to return to normal, walking without AD  OBJECTIVE:    TODAY'S TREATMENT: 01/30/23 Activity Comments  TENS unit applied Large area IFC e-stim applied to mid-thoracic high lumbar to wear during session  Multisensory balance challenges Wide BOS on foam Narrow BOS on firm -greatest difficulty with eyes closed/head turns both conditions  Sidestepping x 2 min 4# ankle weights UE support  Alt stair taps x 2 min 4# 6" step to improve endurance  LAQ x 2 min 4#, alternating  Standing march x 2 min 4#, BUE support  Rocker board x 2 min -ant-post; lateral  NU-step resistance intervals x 8 min 30 sec heavy/light     TODAY'S TREATMENT: 01/26/23  Aquatic therapy at [facility] - pool temperature 91 degrees   Patient seen for aquatic therapy today.  Treatment took place in water 3.6-4.8 feet deep depending upon activity.  Patient entered and exited the pool via step-to pattern of stair negotiation w/ bilateral rails at [facility] level.   Exercises: Water walking warmup:  forwards, laterally, and backwards 4x18 ft each without floatation device CGA -STS x20 w/o UE support for improved anterior weight shift and glut engagement -LAQ w/ 5.5 lb ankle weights x30 alternating LE -Standing hip 3-way x20 each LE w/ 5.5 lb ankle weight -Walking marches w/ yellow noodle support and ankle weights 5x20 ft at edge of wall -Deep squats 2x20 using wall support, pt facilitating posterior weight shift and cuing for maintaining heels on ground -tandem walking at wall w/ unilateral support 5x18 ft -tandem stance CGA no UE support 4x1 minute alt LE in rear, more difficulty with  LLE in rear  Patient requires buoyancy of the water for support for reduced fall risk with gait training and balance exercises with CGA support. Exercises able to be performed safely in water without the risk of fall compared to those same exercises performed on land; viscosity of water needed for resistance for strengthening. Current of water provides perturbations for challenging static and dynamic balance.   PATIENT EDUCATION: Education details: Focus of aquatic session. Person educated: Patient Education method: Explanation, Demonstration, Tactile cues, and Verbal cues Education comprehension: verbalized understanding    HOME EXERCISE PROGRAM: Access Code: [email or web address] URL: [email or web address] Date: 01/10/2023 Prepared by: [facility] - Outpatient  Rehab - [facility]  Exercises - Seated Knee Extension with Resistance  - 1 x daily - 7 x weekly - 3 sets - 10 reps - Seated Hamstring Curls with Resistance  - 1 x daily - 7 x weekly - 3 sets - 10 reps - Seated Hip Abduction with Resistance  - 1 x daily - 7 x weekly - 3 sets - 10 reps - Seated Hip Adduction Isometrics with Ball  - 1 x daily - 7 x weekly - 3 sets - 10 reps - Hooklying Clamshell with Resistance  - 1 x daily - 7 x weekly - 3 sets - 10 reps - Seated Shoulder Horizontal Abduction with Resistance  - 1 x daily - 7 x weekly - 3 sets - 10 reps - Seated Shoulder Row with Resistance Anchored at Feet  - 1 x daily - 7 x weekly - 3 sets - 10 reps - Alternating Step Taps with Counter Support  - 1 x daily - 5 x weekly - 2 sets - 10 reps - Standing Gastroc Stretch at Counter  - 1-2 x daily - 7 x weekly - 1 sets - 3 reps - 30 sec hold  ---------------------------------------------------------------------- DIAGNOSTIC FINDINGS: IMPRESSION: 1. Significantly regressed bilateral pleural effusions and improved bilateral ventilation since last month. 2. No new cardiopulmonary abnormality  COGNITION: Overall cognitive status:  Within functional limits for tasks assessed   SENSATION: Proprioception: Impaired  Decreased LLE > RLE COORDINATION: Minimal impairment LLE  EDEMA:  none  MUSCLE TONE: NT      POSTURE: No Significant postural limitations  LOWER EXTREMITY ROM:     Active  Right Eval Left Eval  Hip flexion    Hip extension    Hip abduction    Hip adduction    Hip internal rotation    Hip external rotation    Knee flexion 120 120  Knee extension 0 0  Ankle dorsiflexion 15 10  Ankle plantarflexion    Ankle inversion    Ankle eversion     (Blank rows = not tested)  LOWER EXTREMITY MMT:    MMT Right Eval Left Eval  Hip flexion 3+ 3+  Hip extension    Hip abduction 3+ 3+  Hip adduction 4 4  Hip internal rotation    Hip external rotation    Knee flexion 4+ 4  Knee extension 5 4-  Ankle dorsiflexion 4+ 3+  Ankle plantarflexion 3+ 3+  Ankle inversion    Ankle eversion    (Blank rows = not tested)  BED MOBILITY:  Modified indep due to back precautions  TRANSFERS: Assistive device utilized: Environmental consultant - 2 wheeled  Sit to stand: Modified independence Stand to sit: Modified independence Chair to chair: Modified independence Floor:  NT    CURB:  Level of Assistance: SBA Assistive device utilized: Environmental consultant - 2 wheeled Curb Comments:   STAIRS: Level of Assistance: SBA Stair Negotiation Technique: Step to Pattern Alternating Pattern  with Bilateral Rails Number of Stairs: 8  Height of Stairs: 4-6"  Comments:   GAIT: Gait pattern: Left foot flat Distance walked:  Assistive device utilized: Walker - 2 wheeled Level of assistance: Modified independence Comments: ambulates with good stride, decreased speed. Without AD ambulates with past retracts  FUNCTIONAL TESTS:  5 times sit to stand: 25 sec w/ BUE Timed up and go (TUG): 19 sec  M-CTSIB  Condition 1: Firm Surface, EO 30 Sec, Moderate Sway  Condition 2: Firm Surface, EC 10 Sec,  left LOB  Sway  Condition 3: Foam  Surface, EO  Sec,  Sway  Condition 4: Foam Surface, EC  Sec,  Sway         GOALS: Goals reviewed with patient? Yes  SHORT TERM GOALS: Target date: 01/11/2023    Patient will be independent in HEP to improve functional outcomes Baseline: Goal status: IN PROGRESS  2.  Patient will achieve 12 seconds for TUG test to manifest reduced risk for falls Baseline: 19 w/ RW>17.38 sec 01/10/2023; 13.69 sec 01/25/23 Goal status: PARTIALLY MET, 01/25/23  3.  Demo improved BLE strength and reduced risk for falls per time 15 sec 5xSTS test Baseline: 25 sec w/ BUE push-off; 14.66 sec with UE support at chair; 17.3 sec with hands on knees Goal status: MET, 01/10/2023  4. Demo modified independent stair ambulation with reciprocal pattern and single HR to improve community mobility  Baseline: SBA, BHR  Goal status: IN PROGRESS  LONG TERM GOALS: Target date: 02/22/2023    Demo low risk for falls per score 50/56 Berg Balance Test Baseline: 38/56; 44/56 01/25/23 Goal status: IN PROGRESS 01/25/23  2.  Demo independent ambulation level surfaces w/out AD x 150 ft to improve independence and safety with household ambulation Baseline: dependent on RW; NT d/t time 01/25/23 Goal status: IN PROGRESS 01/25/23  3.  Demo gross BLE strength to 4+/5 to improve activity tolerance and stability for unsupported standing/gait Baseline: B hip flexion, L knee extension 01/25/23 Goal status: IN PROGRESS 01/25/23     ASSESSMENT:  CLINICAL IMPRESSION: Session focus on improving postural stability and balance via multisensory balance activities. Greatest deficits with narrow BOS, compliant surfaces, and/or eyes closed/head movement conditions.  Continued focus on improving muscular enduance to enhance activity tolerance and gait endurance with superset time activities x 2 min with good tolerance.  Only required one therapeutic rest period during session. Continued sessions to progress activities and improve  proprioception, kinesthetic awareness, and safety with ambulation using less restrctive AD.   OBJECTIVE IMPAIRMENTS: Abnormal gait, cardiopulmonary status limiting activity, decreased activity tolerance, decreased balance, decreased coordination, decreased endurance, decreased knowledge of use of DME, difficulty walking, decreased strength, impaired perceived functional ability, and pain.   ACTIVITY LIMITATIONS: carrying, lifting, bending, standing, squatting, stairs, transfers, bathing, dressing, reach over head, and locomotion level  PARTICIPATION LIMITATIONS: meal prep, cleaning, laundry, driving, shopping, community activity, occupation, and yard work  PERSONAL FACTORS: Time since onset of injury/illness/exacerbation and 1-2 comorbidities: PMH  are also affecting patient's functional outcome.   REHAB POTENTIAL: Excellent  CLINICAL DECISION MAKING: Evolving/moderate complexity  EVALUATION COMPLEXITY: Moderate  PLAN:  PT FREQUENCY: 1-2x/week  PT DURATION: 4 weeks  PLANNED INTERVENTIONS: Therapeutic exercises, Therapeutic activity, Neuromuscular re-education, Balance training, Gait training, Patient/Family education, Self Care, Joint mobilization, Stair training, Vestibular training, Canalith repositioning, Orthotic/Fit training, DME instructions, Aquatic Therapy, Electrical stimulation, Cryotherapy, Moist heat, and Manual therapy  PLAN FOR NEXT SESSION:  Review updates to HEP and finish assessing STGs.  Try step ups/step downs (?) for strengthening, corner balance activities, general BLE strength.  Gait training with cane   12:40 PM, 01/30/23 M. Shary Decamp, PT, DPT Physical Therapist- [place] Office Number: [phone]

## 2023-01-31 ENCOUNTER — Other Ambulatory Visit: Payer: Self-pay | Admitting: Family Medicine

## 2023-01-31 NOTE — Telephone Encounter (Signed)
Medication Refill - Medication: pregabalin (LYRICA) 75 MG capsule [[id number]]  Has the patient contacted their pharmacy? Yes.      Preferred Pharmacy (with phone number or street name):  [facility]  [address], [place] [address]  Phone:  [phone]  Fax:  [phone]  DEA #:  [id number] Pharmacy Comments: 7/19 30 ds     Has the patient been seen for an appointment in the last year OR does the patient have an upcoming appointment? Yes.    Agent: Please be advised that RX refills may take up to 3 business days. We ask that you follow-up with your pharmacy.

## 2023-02-01 ENCOUNTER — Other Ambulatory Visit (HOSPITAL_COMMUNITY): Payer: Self-pay

## 2023-02-01 NOTE — Telephone Encounter (Signed)
Requested medication (s) are due for refill today: yes   Requested medication (s) are on the active medication list: yes   Last refill:  10/28/22 #30 1 refills  Future visit scheduled: yes in 4 weeks   Notes to clinic:  not delegated per protocol. Last ordered by David Minion, MD. Patient requesting refills. Do you want to refill Rx?     Requested Prescriptions  Pending Prescriptions Disp Refills   pregabalin (LYRICA) 75 MG capsule 30 capsule 1    Sig: Take 1 capsule (75 mg total) by mouth at bedtime.     Not Delegated - Neurology:  Anticonvulsants - Controlled - pregabalin Failed - 01/31/2023  3:59 PM      Failed - This refill cannot be delegated      Failed - Cr in normal range and within 360 days    Creat  Date Value Ref Range Status  12/12/2022 1.26 0.70 - 1.35 mg/dL Final   Creatinine, Ser  Date Value Ref Range Status  01/09/2023 1.28 (H) 0.76 - 1.27 mg/dL Final   Creatinine, Urine  Date Value Ref Range Status  08/12/2022 35 mg/dL Final    Comment:    Performed at [facility], 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 [name], MD   3 months ago Hospital discharge follow-up   [facility] [place], Jomarie Longs, FNP   1 year ago Type 2 diabetes mellitus with hyperglycemia, with long-term current use of insulin [facility])   [facility] [place], [place], [place]   2 years ago Type 2 diabetes mellitus with hyperglycemia, with long-term current use of insulin [facility])   [place] [facility] [place], Shea Stakes, NP   2 years ago Essential hypertension   [place] [facility]  [place], Shea Stakes, NP       Future Appointments             In 4 weeks [name], MD [facility]   In 1 month Danelle Earthly, MD [facility], [facility]

## 2023-02-03 ENCOUNTER — Other Ambulatory Visit (HOSPITAL_COMMUNITY): Payer: Self-pay

## 2023-02-03 MED ORDER — PREGABALIN 75 MG PO CAPS
75.0000 mg | ORAL_CAPSULE | Freq: Every day | ORAL | 1 refills | Status: DC
Start: 2023-02-03 — End: 2023-03-01
  Filled 2023-02-03: qty 30, 30d supply, fill #0
  Filled 2023-02-28: qty 30, 30d supply, fill #1
  Filled ????-??-??: fill #1

## 2023-02-05 ENCOUNTER — Encounter (HOSPITAL_COMMUNITY): Payer: Self-pay

## 2023-02-05 ENCOUNTER — Emergency Department (HOSPITAL_COMMUNITY)
Admission: EM | Admit: 2023-02-05 | Discharge: 2023-02-05 | Disposition: A | Discharge status: Home / Self Care | Payer: Medicaid Other | Attending: Emergency Medicine | Admitting: Emergency Medicine

## 2023-02-05 DIAGNOSIS — M545 Low back pain, unspecified: Secondary | ICD-10-CM | POA: Diagnosis not present

## 2023-02-05 DIAGNOSIS — Z7901 Long term (current) use of anticoagulants: Secondary | ICD-10-CM | POA: Diagnosis not present

## 2023-02-05 DIAGNOSIS — Z79899 Other long term (current) drug therapy: Secondary | ICD-10-CM | POA: Insufficient documentation

## 2023-02-05 DIAGNOSIS — Z794 Long term (current) use of insulin: Secondary | ICD-10-CM | POA: Insufficient documentation

## 2023-02-05 DIAGNOSIS — G062 Extradural and subdural abscess, unspecified: Secondary | ICD-10-CM | POA: Insufficient documentation

## 2023-02-05 DIAGNOSIS — M549 Dorsalgia, unspecified: Secondary | ICD-10-CM | POA: Diagnosis present

## 2023-02-05 DIAGNOSIS — G8929 Other chronic pain: Secondary | ICD-10-CM | POA: Diagnosis not present

## 2023-02-05 MED ORDER — HYDROMORPHONE HCL 2 MG PO TABS
1.0000 mg | ORAL_TABLET | Freq: Once | ORAL | Status: AC
  Administered 2023-02-05: 1 mg via ORAL
  Filled 2023-02-05: qty 1

## 2023-02-05 MED ORDER — PREGABALIN 75 MG PO CAPS: 75.0000 mg | ORAL_CAPSULE | Freq: Every evening | ORAL | 0 refills | Status: DC

## 2023-02-05 MED ORDER — HYDROCODONE-ACETAMINOPHEN 10-325 MG PO TABS: 1.0000 | ORAL_TABLET | Freq: Four times a day (QID) | ORAL | 0 refills | Status: AC | PRN

## 2023-02-05 NOTE — ED Provider Notes (Signed)
[place] EMERGENCY DEPARTMENT AT [facility] Provider Note   CSN: [id number] Arrival date & time: 02/05/23  1122     History Chief Complaint  Patient presents with   Back Pain         David [name] is a 62 y.o. male.  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 Is on indefinite doxy suppression.  Follows with Neurosurgery and recently established care with [facility] pain clinic on 10/31 with Dr. Sidney Ace. Patient was supposed to be started on Hisingla and bridged with Norco along with lyrica for hyperalgesia secondary to nerve damage. [facility] did not have stock of hysingla until Monday 11/4. Patient ran out of the norco that was given to bridge patient while starting hisingla. Now in intense back pain with sciatica. No difference in neurologic deficits from prior. Denies any numbness of tingling. No fevers.  No dysuria.  Patient lives with spouse who helps him at home.  He was able to go to physical therapy 6 days ago however, pain worsened since then after running out of pain medication.   The history is provided by the patient.  Back Pain Associated symptoms: no dysuria, no fever, no headaches and no numbness        Home Medications Prior to Admission medications   Medication Sig Start Date End Date Taking? Authorizing Provider  pregabalin (LYRICA) 75 MG capsule Take 1 capsule (75 mg total) by mouth at bedtime for 5 days. 02/05/23 02/10/23 Yes Lockie Mola, MD  Accu-Chek Softclix Lancets lancets Use to check blood sugar 3 times daily. 11/15/22   [name], MD  apixaban (ELIQUIS) 5 MG TABS tablet Take 1 tablet (5 mg total) by mouth 2 (two) times daily. 10/31/22   Eleonore Chiquito, FNP  bisacodyl (DULCOLAX) 10 MG suppository Place 1 suppository (10 mg total) rectally daily as needed for moderate constipation. 10/21/22   Kathlen Mody, MD  Blood Glucose Monitoring Suppl  (ACCU-CHEK GUIDE) w/Device KIT Use to check blood sugar 3 times daily. E11.9 11/15/22   [name], MD  busPIRone (BUSPAR) 15 MG tablet Take 1 tablet (15 mg total) by mouth 2 (two) times daily. 10/28/22   Burnadette Pop, MD  carvedilol (COREG) 3.125 MG tablet Take 1 tablet (3.125 mg total) by mouth 2 (two) times daily. 10/28/22   Burnadette Pop, MD  digoxin (LANOXIN) 0.125 MG tablet Take 1 tablet (125 mcg total) by mouth once daily. 10/28/22   Burnadette Pop, MD  doxycycline (VIBRA-TABS) 100 MG tablet Take 1 tablet (100 mg total) by mouth every 12 (twelve) hours. 10/28/22   Burnadette Pop, MD  DULoxetine (CYMBALTA) 20 MG capsule Take 1 capsule (20 mg total) by mouth daily. 10/28/22   Burnadette Pop, MD  famotidine (PEPCID) 20 MG tablet Take 1 tablet (20 mg total) by mouth 2 times daily. 10/28/22   Burnadette Pop, MD  ferrous sulfate 325 (65 FE) MG tablet Take 1 tablet (325 mg total) by mouth every other day. 10/28/22   Burnadette Pop, MD  finasteride (PROSCAR) 5 MG tablet Take 1 tablet (5 mg total) by mouth daily. 10/28/22   Burnadette Pop, MD  fluticasone (FLONASE) 50 MCG/ACT nasal spray Place 2 sprays into both nostrils daily. 10/21/22   Kathlen Mody, MD  furosemide (LASIX) 40 MG tablet Take 1/2 tablet (20 mg total) by mouth daily. 10/28/22   Burnadette Pop, MD  glucose blood (ACCU-CHEK GUIDE) test strip Use to check  blood sugar 3 times daily. 11/15/22   [name], MD  HYDROcodone-acetaminophen (NORCO) 10-325 MG tablet Take 1 tablet by mouth every 4 (four) hours as needed for pain. 12/30/22     HYDROcodone-acetaminophen (NORCO) 10-325 MG tablet Take 1 tablet by mouth every 6 (six) hours as needed for severe pain. 02/05/23 02/10/23  Lockie Mola, MD  insulin glargine (LANTUS SOLOSTAR) 100 UNIT/ML Solostar Pen Inject 25 Units into the skin daily. 12/29/22   [name], MD  Insulin Pen Needle (PEN NEEDLES) 32G X 4 MM MISC Use to inject insulin once daily. 11/15/22   [name], MD   lidocaine 4 % Place 1 patch onto the skin daily. 10/28/22   Burnadette Pop, MD  liver oil-zinc oxide (DESITIN) 40 % ointment Apply topically 2 (two) times daily. 10/21/22   Kathlen Mody, MD  magnesium oxide (MAG-OX) 400 (240 Mg) MG tablet Take 1 tablet (400 mg total) by mouth daily. 10/28/22   Burnadette Pop, MD  methocarbamol (ROBAXIN) 750 MG tablet Take 1 tablet (750 mg total) by mouth 3 (three) times daily as needed for muscle spasms 11/09/22     naloxone (NARCAN) nasal spray 4 mg/0.1 mL Opiates overdose 10/21/22   Kathlen Mody, MD  polyethylene glycol powder (GLYCOLAX/MIRALAX) 17 GM/SCOOP powder Take 17 g by mouth daily. 12/29/22   [name], MD  pregabalin (LYRICA) 75 MG capsule Take 1 capsule (75 mg total) by mouth at bedtime. 02/03/23   [name], MD  rosuvastatin (CRESTOR) 20 MG tablet Take 1 tablet (20 mg total) by mouth daily. 01/10/23   [name], MD  sacubitril-valsartan (ENTRESTO) 49-51 MG Take 1 tablet by mouth 2 (two) times daily. 10/28/22   Burnadette Pop, MD  senna-docusate (SENOKOT-S) 8.6-50 MG tablet Take 1 tablet by mouth 2 (two) times daily. 10/28/22   Burnadette Pop, MD  tamsulosin (FLOMAX) 0.4 MG CAPS capsule Take 1 capsule (0.4 mg total) by mouth daily after supper. 10/28/22   Burnadette Pop, MD  terbinafine (LAMISIL) 250 MG tablet Take 1 tablet (250 mg total) by mouth daily. 12/29/22   [name], MD      Allergies    Patient has no known allergies.    Review of Systems   Review of Systems  Constitutional:  Negative for fever.  Respiratory:  Negative for cough, chest tightness and shortness of breath.   Gastrointestinal:  Negative for vomiting.  Genitourinary:  Negative for dysuria.  Musculoskeletal:  Positive for back pain. Negative for neck pain and neck stiffness.  Neurological:  Negative for seizures, syncope, light-headedness, numbness and headaches.    Physical Exam Updated Vital Signs BP (!) 165/110 (BP Location: Right Arm)   Pulse (!)  59   Temp 98.4 F (36.9 C) (Oral)   Resp 16   Ht 6' (1.829 m)   Wt 64 kg   SpO2 100%   BMI 19.14 kg/m  Physical Exam Constitutional:      General: He is not in acute distress.    Appearance: He is not ill-appearing.  Eyes:     Extraocular Movements: Extraocular movements intact.     Pupils: Pupils are equal, round, and reactive to light.  Cardiovascular:     Rate and Rhythm: Normal rate and regular rhythm.     Pulses: Normal pulses.     Heart sounds: Normal heart sounds.  Pulmonary:     Effort: Pulmonary effort is normal.     Breath sounds: Normal breath sounds.  Abdominal:     General: Abdomen is  flat.     Palpations: Abdomen is soft.     Tenderness: There is abdominal tenderness (tenderness to light touch over front of abdomen and lower thorax).  Musculoskeletal:     Cervical back: Normal range of motion. No rigidity or tenderness.  Skin:    General: Skin is warm and dry.     Capillary Refill: Capillary refill takes less than 2 seconds.     Comments: Edema with some induration midline middle of thorax as per patient's baseline, not warm or erythematous.   Neurological:     Mental Status: He is alert. Mental status is at baseline.     Cranial Nerves: No cranial nerve deficit.     Sensory: No sensory deficit.     Motor: Weakness (strength in left lower extremity 4/5 (at baseline after recent spine surgery)) present.  Psychiatric:     Comments: Tearful secondary to pain      ED Results / Procedures / Treatments   Labs (all labs ordered are listed, but only abnormal results are displayed) Labs Reviewed - No data to display  EKG None  Radiology No results found.  Procedures Procedures    Medications Ordered in ED Medications  HYDROmorphone (DILAUDID) tablet 1 mg (1 mg Oral Given 02/05/23 1602)    ED Course/ Medical Decision Making/ A&P                                 Medical Decision Making This patient presents to the ED with chief complaint(s) of  acute exacerbation of chronic back pain with pertinent past medical history of chronic back pain in the setting of recent vertebral osteomyelitis/disciitis and I&D on chronic opioids which further complicates the presenting complaint. The complaint involves an extensive differential diagnosis and also carries with it a high risk of complications and morbidity.    The differential diagnosis includes worsening of vertebral disciitis, transverse myelitis, AAA, meningitis.   Additional history obtained: Records reviewed previous admission documents, Primary Care Documents, and hospital notes from infectious disease, neurosurgery, PCP, as well as physical therapy notes  Discussed medications with patient's pharmacy.   ED Course and Reassessment: Patient hemodynamically stable and neuro exam at baseline. Most likely acute exacerbation of chronic pain in the setting of lack of access to his medication.  Gave dilaudid 1 mg po.    Consideration for admission or further workup: Considered whether patient needed labs for evaluation of infection, or imaging for evaluation of spine and spinal cord. However, history and exam did not warrant further work up.  Social Determinants of health: disabled, now unemployed, lack of transportation   Will treat with dilaudid in the ED and send patient with one day supply of norco until he can pick up medication at [facility].  Continue to follow up with [facility], PCP and [facility]   Amount and/or Complexity of Data Reviewed External Data Reviewed: radiology and notes.  Risk Prescription drug management. Diagnosis or treatment significantly limited by social determinants of health.   Final Clinical Impression(s) / ED Diagnoses Final diagnoses:  Epidural abscess  Chronic midline back pain, unspecified back location    Rx / DC Orders ED Discharge Orders          Ordered    HYDROcodone-acetaminophen (NORCO) 10-325 MG tablet  Every 6 hours PRN         02/05/23 1626    pregabalin (LYRICA) 75 MG capsule  Nightly  02/05/23 1626              Lockie Mola, MD 02/05/23 1627    Elayne Snare K, DO 02/05/23 1630

## 2023-02-05 NOTE — ED Triage Notes (Signed)
Pt has chronic back pain. Had surgery in July on back. States recently sent over another medication for pain but pharmacy was out of stock and reports pain is unbearable. Denies any numbness or tingling, ni new symptoms. Pain in low-mid back.

## 2023-02-05 NOTE — Discharge Instructions (Signed)
We have given you a short supply of norco and lyrica until you can pick up your medication from [facility].  Please follow up with your pain clinic regarding your pain medications.

## 2023-02-06 ENCOUNTER — Other Ambulatory Visit (HOSPITAL_COMMUNITY): Payer: Self-pay

## 2023-02-06 ENCOUNTER — Other Ambulatory Visit: Payer: Self-pay

## 2023-02-07 ENCOUNTER — Encounter: Payer: Self-pay | Admitting: Rehabilitation

## 2023-02-07 ENCOUNTER — Ambulatory Visit: Payer: Medicaid Other | Attending: Family | Admitting: Rehabilitation

## 2023-02-07 DIAGNOSIS — R2681 Unsteadiness on feet: Secondary | ICD-10-CM | POA: Insufficient documentation

## 2023-02-07 DIAGNOSIS — R262 Difficulty in walking, not elsewhere classified: Secondary | ICD-10-CM | POA: Diagnosis present

## 2023-02-07 DIAGNOSIS — R2689 Other abnormalities of gait and mobility: Secondary | ICD-10-CM | POA: Insufficient documentation

## 2023-02-07 DIAGNOSIS — M6281 Muscle weakness (generalized): Secondary | ICD-10-CM | POA: Diagnosis present

## 2023-02-07 NOTE — Therapy (Signed)
OUTPATIENT PHYSICAL THERAPY   Patient Name: David [name] MRN: [id number] DOB:07-Mar-1961, 62 y.o., male Today's Date: 02/07/2023   PCP: [name], MD REFERRING PROVIDER: Eleonore Chiquito, FNP  END OF SESSION:  PT End of Session - 02/07/23 0826     Visit Number 11    Number of Visits 16    Date for PT Re-Evaluation 02/22/23    Authorization Type [employer]    Authorization - Number of Visits 27    PT Start Time 0930    PT Stop Time 1017    PT Time Calculation (min) 47 min    Equipment Utilized During Treatment Gait belt    Activity Tolerance Patient tolerated treatment well    Behavior During Therapy WFL for tasks assessed/performed               Past Medical History:  Diagnosis Date   Cardiomyopathy (HCC)    a. EF 45% in 2019.   CHF (congestive heart failure) (HCC)    Chronic anticoagulation 09/30/2017   Diabetes mellitus type 2 in nonobese [facility])    Discitis of thoracic region 09/25/2022   Does not have health insurance    Essential hypertension 09/26/2017   Financial difficulties    H/O noncompliance with medical treatment, presenting hazards to health    Hardware complicating wound infection (HCC) 09/25/2022   Non-insulin treated type 2 diabetes mellitus (HCC) 09/26/2017   Persistent atrial fibrillation (HCC)    Sleep apnea suspected 09/30/2017   Past Surgical History:  Procedure Laterality Date   ABDOMINAL AORTOGRAM W/LOWER EXTREMITY N/A 09/02/2021   Procedure: ABDOMINAL AORTOGRAM W/LOWER EXTREMITY;  Surgeon: Nada Libman, MD;  Location: [facility];  Service: Cardiovascular;  Laterality: N/A;   ABDOMINAL AORTOGRAM W/LOWER EXTREMITY N/A 12/28/2021   Procedure: ABDOMINAL AORTOGRAM W/LOWER EXTREMITY;  Surgeon: Nada Libman, MD;  Location: [facility];  Service: Cardiovascular;  Laterality: N/A;   ABDOMINAL AORTOGRAM W/LOWER EXTREMITY N/A 07/28/2022   Procedure: ABDOMINAL AORTOGRAM W/LOWER EXTREMITY;  Surgeon: Cephus Shelling, MD;  Location: [facility];  Service: Cardiovascular;  Laterality: N/A;   AMPUTATION Right 09/04/2021   Procedure: 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;  Surgeon: Nadara Mustard, MD;  Location: [facility] OR;  Service: Orthopedics;  Laterality: Right;   AMPUTATION Right 08/02/2022   Procedure: AMPUTATION RAY 5TH METATARSAL OF RIGHT FOOT;  Surgeon: [name], MD;  Location: [facility] OR;  Service: Orthopedics;  Laterality: Right;   APPENDECTOMY  1971   BONE BIOPSY Right 08/02/2022   Procedure: BONE BIOPSY OF 5TH METATARSAL RIGHT FOOT;  Surgeon: [name], MD;  Location: [facility] OR;  Service: Orthopedics;  Laterality: Right;   CARDIOVERSION N/A 09/28/2017   Procedure: CARDIOVERSION;  Surgeon: Thurmon Fair, MD;  Location: [facility];  Service: Cardiovascular;  Laterality: N/A;   IRRIGATION AND DEBRIDEMENT FOOT  08/02/2022   Procedure: IRRIGATION AND DEBRIDEMENT RIGHT 5TH METATARSAL FOOT;  Surgeon: [name], MD;  Location: [facility] OR;  Service: Orthopedics;;   LAMINECTOMY WITH POSTERIOR LATERAL ARTHRODESIS LEVEL 4 N/A 08/30/2022   Procedure: THORACIC FIVE-THORACIC ELEVEN FUSION, THORACIC EIGHT TRANSPEDICULAR DECOMPRESSION AND PARTIAL CORPECTOMY with O-Arm;  Surgeon: Bedelia Person, MD;  Location: [facility] OR;  Service: Neurosurgery;  Laterality: N/A;   PERIPHERAL VASCULAR BALLOON ANGIOPLASTY  09/02/2021   Procedure: PERIPHERAL VASCULAR BALLOON ANGIOPLASTY;  Surgeon: Nada Libman, MD;  [facility]: [facility];  Service: Cardiovascular;;   PERIPHERAL VASCULAR BALLOON ANGIOPLASTY  12/28/2021   Procedure: PERIPHERAL VASCULAR BALLOON ANGIOPLASTY;  Surgeon: Nada Libman, MD;  [facility]: [facility];  Service: Cardiovascular;;  Posterior Tibial PTA only   TEE WITHOUT CARDIOVERSION N/A 09/28/2017   Procedure: TRANSESOPHAGEAL ECHOCARDIOGRAM (TEE);  Surgeon: Thurmon Fair, MD;  Location: [facility] ENDOSCOPY;  Service: Cardiovascular;  Laterality: N/A;   TEE WITHOUT CARDIOVERSION N/A 09/03/2021    Procedure: TRANSESOPHAGEAL ECHOCARDIOGRAM (TEE);  Surgeon: Chilton Si, MD;  Location: [facility] ENDOSCOPY;  Service: Cardiovascular;  Laterality: N/A;   TEE WITHOUT CARDIOVERSION N/A 07/15/2022   Procedure: TRANSESOPHAGEAL ECHOCARDIOGRAM;  Surgeon: Thurmon Fair, MD;  [facility];  Service: Cardiovascular;  Laterality: N/A;   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 08/08/2022   MRSA bacteremia 08/08/2022   AKI (acute kidney injury), ruled out 07/27/2022   Discitis thoracic region 07/27/2022   Bacteremia due to methicillin resistant Staphylococcus aureus 07/27/2022   Severe protein-calorie malnutrition (HCC) 07/26/2022   PVD (peripheral vascular disease) (HCC) 07/26/2022   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   Bacteremia 07/15/2022   Acute respiratory failure with hypoxia (HCC) 07/12/2022   Atelectasis 07/12/2022   Sepsis due to pneumonia (HCC) 07/12/2022   Gangrene of toe of right foot (HCC)    Sepsis (HCC) 08/29/2021   SIRS (systemic inflammatory response syndrome) (HCC) 08/29/2021   Hyponatremia 08/29/2021   Hypomagnesemia 08/29/2021   Pure hypercholesterolemia 06/17/2021   Pain of left hand 09/06/2019   Cellulitis 08/26/2019   Persistent atrial fibrillation (HCC) 08/26/2019   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) 10/18/2018   Transaminitis 10/17/2018    Hyperbilirubinemia 10/17/2018   Leukocytosis 10/17/2018   Sinusitis 10/17/2018   Chronic anticoagulation 09/30/2017   Smoker 09/30/2017   Cardiomyopathy (HCC) 09/30/2017   Sleep apnea suspected 09/30/2017   Essential hypertension 09/26/2017   Uncontrolled type 2 diabetes with hyperglycemia and hypoglycemia, insulin long term use 09/26/2017   Atrial fibrillation with RVR (HCC) 09/26/2017    ONSET DATE: 08/20/22  REFERRING DIAG: M46.44ICD-10-CMDiscitis of thoracic region  THERAPY DIAG:  Unsteadiness on feet  Difficulty in walking, not elsewhere classified  Muscle weakness (generalized)  Other abnormalities of gait and mobility  Rationale for Evaluation and Treatment: Rehabilitation  SUBJECTIVE:  SUBJECTIVE STATEMENT: Pt reports back pain isn't too bad today (5-6/10).   Wants to try his water shoes today to see if it helps his balance due to previous R 4th/5th ray amp.    Pt accompanied by: self  PERTINENT HISTORY:  Per patient, he was admitted to the local hospital on Aug 20, 2022 and had back surgery thereafter. Patient was then readmitted after being discharged for infection with low BG. He was discharged on October 28, 2022 on oral antibiotics with changes to his insulin regimen. past medical history of PVD, type 2 diabetes with hyperglycemia/hypoglycemia, seizure disorders, hypertension, hyperlipidemia, A-fib (on Eliquis) and recent bacteremia.  He is s/p angioplasty.  Patient reports since discharge he has been doing physical therapy/exercises at home but continues to have severe back pain    PAIN:  Are you having pain? Yes: NPRS scale: 6/10 Pain location: midline back Pain description: stabbing Aggravating factors: activity and with rest Relieving factors: pain medication  PRECAUTIONS:  Back  RED FLAGS:    WEIGHT BEARING RESTRICTIONS: No  FALLS: Has patient fallen in last 6 months? No  LIVING ENVIRONMENT: Lives with: lives with their spouse Lives in: House/apartment, pt reports housing issues since out of work Stairs: No Has following equipment at home: Environmental consultant - 2 wheeled  PLOF: Independent, previously worked as Photographer  PATIENT GOALS: be able to return to normal, walking without AD  OBJECTIVE:     TODAY'S TREATMENT: 02/07/23  Aquatic therapy at [facility] - pool temperature 91 degrees   Patient seen for aquatic therapy today.  Treatment took place in water 3.6-4.8 feet deep depending upon activity.  Patient entered and exited the pool via step-to pattern of stair negotiation w/ bilateral rails at [facility] level.   Warm Up:  Walking forwards approx 18' x 4 laps, backwards x 4 laps with yellow noodle for external support.  Min cues for posture throughout and for increased stride length, esp with backwards walking.    Strengthening/balance:  Side stepping x 2 laps holding smaller barbells abd arms to surface of water when LEs abd, performing squat then adducting arms/legs.  PT providing min A throughout due to intermittent LOB during squat and to provide tactile facilitation for correct squat technique.  Forward marching with larger barbells for support and min A from PT (holding barbells) x 2 laps.  Braiding x 1 lap each direction with support from noodle and min A from PT with max verbal cues for technique.  Stepping strategy stepping forward/laterally/backwards x 4 rounds on each side again with noodle and min A from PT for support.  Cues for full weight shift towards stepping leg and single step to return to midline.  Posterior stepping was most difficult.  Ai Chi "Accepting" for ant/post weight shift with PT guiding pts hands for adequate arm movement and to guide weight shift.  Performed x 10 reps each side.    Sit<>stand from pool bench with small step  under feet, reaching forward with BUEs for forward weight shift and trunk lean x 10 reps with PT initially providing min facilitation for full weight shift fading to S level with repetition.  Step ups with wall for single UE support initially fading to no UE support x 10 reps on each side.  Side step ups/down x 10 reps each direction again with light support and mod cuing for wide step to ensure room for inside foot to land.    LE kicks holding onto wall with PT providing support at  pelvis/hips to ensure horizontal position x 2 sets of 10 kicks.      Patient requires buoyancy of the water for support for reduced fall risk with gait training and balance exercises with CGA support. Exercises able to be performed safely in water without the risk of fall compared to those same exercises performed on land; viscosity of water needed for resistance for strengthening. Current of water provides perturbations for challenging static and dynamic balance.   PATIENT EDUCATION: Education details: Focus of aquatic session. Person educated: Patient Education method: Explanation, Demonstration, Tactile cues, and Verbal cues Education comprehension: verbalized understanding    HOME EXERCISE PROGRAM: Access Code: [email or web address] URL: [email or web address] Date: 01/10/2023 Prepared by: [facility] - [facility]  Exercises - Seated Knee Extension with Resistance  - 1 x daily - 7 x weekly - 3 sets - 10 reps - Seated Hamstring Curls with Resistance  - 1 x daily - 7 x weekly - 3 sets - 10 reps - Seated Hip Abduction with Resistance  - 1 x daily - 7 x weekly - 3 sets - 10 reps - Seated Hip Adduction Isometrics with Ball  - 1 x daily - 7 x weekly - 3 sets - 10 reps - Hooklying Clamshell with Resistance  - 1 x daily - 7 x weekly - 3 sets - 10 reps - Seated Shoulder Horizontal Abduction with Resistance  - 1 x daily - 7 x weekly - 3 sets - 10 reps - Seated Shoulder Row with Resistance  Anchored at Feet  - 1 x daily - 7 x weekly - 3 sets - 10 reps - Alternating Step Taps with Counter Support  - 1 x daily - 5 x weekly - 2 sets - 10 reps - Standing Gastroc Stretch at Counter  - 1-2 x daily - 7 x weekly - 1 sets - 3 reps - 30 sec hold  ---------------------------------------------------------------------- DIAGNOSTIC FINDINGS: IMPRESSION: 1. Significantly regressed bilateral pleural effusions and improved bilateral ventilation since last month. 2. No new cardiopulmonary abnormality  COGNITION: Overall cognitive status: Within functional limits for tasks assessed   SENSATION: Proprioception: Impaired  Decreased LLE > RLE COORDINATION: Minimal impairment LLE  EDEMA:  none  MUSCLE TONE: NT      POSTURE: No Significant postural limitations  LOWER EXTREMITY ROM:     Active  Right Eval Left Eval  Hip flexion    Hip extension    Hip abduction    Hip adduction    Hip internal rotation    Hip external rotation    Knee flexion 120 120  Knee extension 0 0  Ankle dorsiflexion 15 10  Ankle plantarflexion    Ankle inversion    Ankle eversion     (Blank rows = not tested)  LOWER EXTREMITY MMT:    MMT Right Eval Left Eval  Hip flexion 3+ 3+  Hip extension    Hip abduction 3+ 3+  Hip adduction 4 4  Hip internal rotation    Hip external rotation    Knee flexion 4+ 4  Knee extension 5 4-  Ankle dorsiflexion 4+ 3+  Ankle plantarflexion 3+ 3+  Ankle inversion    Ankle eversion    (Blank rows = not tested)  BED MOBILITY:  Modified indep due to back precautions  TRANSFERS: Assistive device utilized: Environmental consultant - 2 wheeled  Sit to stand: Modified independence Stand to sit: Modified independence Chair to chair: Modified independence Floor:  NT    CURB:  Level of Assistance: SBA Assistive device utilized: Environmental consultant - 2 wheeled Curb Comments:   STAIRS: Level of Assistance: SBA Stair Negotiation Technique: Step to Pattern Alternating Pattern  with  Bilateral Rails Number of Stairs: 8  Height of Stairs: 4-6"  Comments:   GAIT: Gait pattern: Left foot flat Distance walked:  Assistive device utilized: Walker - 2 wheeled Level of assistance: Modified independence Comments: ambulates with good stride, decreased speed. Without AD ambulates with past retracts  FUNCTIONAL TESTS:  5 times sit to stand: 25 sec w/ BUE Timed up and go (TUG): 19 sec  M-CTSIB  Condition 1: Firm Surface, EO 30 Sec, Moderate Sway  Condition 2: Firm Surface, EC 10 Sec,  left LOB  Sway  Condition 3: Foam Surface, EO  Sec,  Sway  Condition 4: Foam Surface, EC  Sec,  Sway         GOALS: Goals reviewed with patient? Yes  SHORT TERM GOALS: Target date: 01/11/2023    Patient will be independent in HEP to improve functional outcomes Baseline: Goal status: IN PROGRESS  2.  Patient will achieve 12 seconds for TUG test to manifest reduced risk for falls Baseline: 19 w/ RW>17.38 sec 01/10/2023; 13.69 sec 01/25/23 Goal status: PARTIALLY MET, 01/25/23  3.  Demo improved BLE strength and reduced risk for falls per time 15 sec 5xSTS test Baseline: 25 sec w/ BUE push-off; 14.66 sec with UE support at chair; 17.3 sec with hands on knees Goal status: MET, 01/10/2023  4. Demo modified independent stair ambulation with reciprocal pattern and single HR to improve community mobility  Baseline: SBA, BHR  Goal status: IN PROGRESS  LONG TERM GOALS: Target date: 02/22/2023    Demo low risk for falls per score 50/56 Berg Balance Test Baseline: 38/56; 44/56 01/25/23 Goal status: IN PROGRESS 01/25/23  2.  Demo independent ambulation level surfaces w/out AD x 150 ft to improve independence and safety with household ambulation Baseline: dependent on RW; NT d/t time 01/25/23 Goal status: IN PROGRESS 01/25/23  3.  Demo gross BLE strength to 4+/5 to improve activity tolerance and stability for unsupported standing/gait Baseline: B hip flexion, L knee extension  01/25/23 Goal status: IN PROGRESS 01/25/23     ASSESSMENT:  CLINICAL IMPRESSION: Pt able to tolerate increased balance challenge today, still demonstrating less balance on LLE vs RLE.  Did note that he continues to have difficulty in R>L tandem stance but actually did better in R tandem stance during Ai Chi postures.  Pt reporting less pain overall today so likely was able to tolerate more challenges in pool.  He reports he has no more pool scheduled but sees primary PT Thursday so encouraged him to speak with kelly regarding future appts.   OBJECTIVE IMPAIRMENTS: Abnormal gait, cardiopulmonary status limiting activity, decreased activity tolerance, decreased balance, decreased coordination, decreased endurance, decreased knowledge of use of DME, difficulty walking, decreased strength, impaired perceived functional ability, and pain.   ACTIVITY LIMITATIONS: carrying, lifting, bending, standing, squatting, stairs, transfers, bathing, dressing, reach over head, and locomotion level  PARTICIPATION LIMITATIONS: meal prep, cleaning, laundry, driving, shopping, community activity, occupation, and yard work  PERSONAL FACTORS: Time since onset of injury/illness/exacerbation and 1-2 comorbidities: PMH  are also affecting patient's functional outcome.   REHAB POTENTIAL: Excellent  CLINICAL DECISION MAKING: Evolving/moderate complexity  EVALUATION COMPLEXITY: Moderate  PLAN:  PT FREQUENCY: 1-2x/week  PT DURATION: 4 weeks  PLANNED INTERVENTIONS: Therapeutic exercises, Therapeutic activity, Neuromuscular re-education, Balance training, Gait training, Patient/Family education, Self Care, Joint  mobilization, Stair training, Vestibular training, Canalith repositioning, Orthotic/Fit training, DME instructions, Aquatic Therapy, Electrical stimulation, Cryotherapy, Moist heat, and Manual therapy  PLAN FOR NEXT SESSION:  Review updates to HEP and finish assessing STGs.  Try step ups/step downs (?) for  strengthening, corner balance activities, general BLE strength.  Gait training with cane    Harriet Butte, PT, MPT [facility] [address] [place], [place], [address] Phone: [phone]   Fax:  [phone] 02/07/23, 10:34 AM

## 2023-02-09 ENCOUNTER — Ambulatory Visit: Payer: Medicaid Other

## 2023-02-09 DIAGNOSIS — R262 Difficulty in walking, not elsewhere classified: Secondary | ICD-10-CM

## 2023-02-09 DIAGNOSIS — R2681 Unsteadiness on feet: Secondary | ICD-10-CM | POA: Diagnosis not present

## 2023-02-09 DIAGNOSIS — M6281 Muscle weakness (generalized): Secondary | ICD-10-CM

## 2023-02-09 DIAGNOSIS — R2689 Other abnormalities of gait and mobility: Secondary | ICD-10-CM

## 2023-02-09 NOTE — Therapy (Signed)
OUTPATIENT PHYSICAL THERAPY   Patient Name: David [name] MRN: [id number] DOB:13-Apr-1960, 62 y.o., male Today's Date: 02/09/2023   PCP: [name], MD REFERRING PROVIDER: Eleonore Chiquito, FNP  END OF SESSION:  PT End of Session - 02/09/23 1139     Visit Number 12    Number of Visits 16    Date for PT Re-Evaluation 02/22/23    Authorization Type Medicaid-[employer]    Authorization - Visit Number 10    Authorization - Number of Visits 27    PT Start Time 1145    PT Stop Time 1230    PT Time Calculation (min) 45 min    Equipment Utilized During Treatment Gait belt    Activity Tolerance Patient tolerated treatment well    Behavior During Therapy WFL for tasks assessed/performed               Past Medical History:  Diagnosis Date   Cardiomyopathy (HCC)    a. EF 45% in 2019.   CHF (congestive heart failure) (HCC)    Chronic anticoagulation 09/30/2017   Diabetes mellitus type 2 in nonobese [facility])    Discitis of thoracic region 09/25/2022   Does not have health insurance    Essential hypertension 09/26/2017   Financial difficulties    H/O noncompliance with medical treatment, presenting hazards to health    Hardware complicating wound infection (HCC) 09/25/2022   Non-insulin treated type 2 diabetes mellitus (HCC) 09/26/2017   Persistent atrial fibrillation (HCC)    Sleep apnea suspected 09/30/2017   Past Surgical History:  Procedure Laterality Date   ABDOMINAL AORTOGRAM W/LOWER EXTREMITY N/A 09/02/2021   Procedure: ABDOMINAL AORTOGRAM W/LOWER EXTREMITY;  Surgeon: Nada Libman, MD;  [facility]: [facility];  Service: Cardiovascular;  Laterality: N/A;   ABDOMINAL AORTOGRAM W/LOWER EXTREMITY N/A 12/28/2021   Procedure: ABDOMINAL AORTOGRAM W/LOWER EXTREMITY;  Surgeon: Nada Libman, MD;  Location: [facility];  Service: Cardiovascular;  Laterality: N/A;   ABDOMINAL AORTOGRAM W/LOWER EXTREMITY N/A 07/28/2022   Procedure: ABDOMINAL AORTOGRAM  W/LOWER EXTREMITY;  Surgeon: Cephus Shelling, MD;  Location: [facility];  Service: Cardiovascular;  Laterality: N/A;   AMPUTATION Right 09/04/2021   Procedure: 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;  Surgeon: Nadara Mustard, MD;  Location[facility] OR;  Service: Orthopedics;  Laterality: Right;   AMPUTATION Right 08/02/2022   Procedure: AMPUTATION RAY 5TH METATARSAL OF RIGHT FOOT;  Surgeon: [name], MD;  Location: [facility] OR;  Service: Orthopedics;  Laterality: Right;   APPENDECTOMY  1971   BONE BIOPSY Right 08/02/2022   Procedure: BONE BIOPSY OF 5TH METATARSAL RIGHT FOOT;  Surgeon: [name], MD;  Location: [facility] OR;  Service: Orthopedics;  Laterality: Right;   CARDIOVERSION N/A 09/28/2017   Procedure: CARDIOVERSION;  Surgeon: Thurmon Fair, MD;  [facility]: [facility];  Service: Cardiovascular;  Laterality: N/A;   IRRIGATION AND DEBRIDEMENT FOOT  08/02/2022   Procedure: IRRIGATION AND DEBRIDEMENT RIGHT 5TH METATARSAL FOOT;  Surgeon: [name], MD;  Location: [facility] OR;  Service: Orthopedics;;   LAMINECTOMY WITH POSTERIOR LATERAL ARTHRODESIS LEVEL 4 N/A 08/30/2022   Procedure: THORACIC FIVE-THORACIC ELEVEN FUSION, THORACIC EIGHT TRANSPEDICULAR DECOMPRESSION AND PARTIAL CORPECTOMY with O-Arm;  Surgeon: Bedelia Person, MD;  Location: [facility] OR;  Service: Neurosurgery;  Laterality: N/A;   PERIPHERAL VASCULAR BALLOON ANGIOPLASTY  09/02/2021   Procedure: PERIPHERAL VASCULAR BALLOON ANGIOPLASTY;  Surgeon: Nada Libman, MD;  [facility];  Service: Cardiovascular;;   PERIPHERAL VASCULAR BALLOON ANGIOPLASTY  12/28/2021   Procedure: PERIPHERAL VASCULAR BALLOON ANGIOPLASTY;  Surgeon: Nada Libman, MD;  [facility];  Service: Cardiovascular;;  Posterior Tibial PTA only   TEE WITHOUT CARDIOVERSION N/A 09/28/2017   Procedure: TRANSESOPHAGEAL ECHOCARDIOGRAM (TEE);  Surgeon: Thurmon Fair, MD;  Location: [facility] ENDOSCOPY;  Service: Cardiovascular;  Laterality: N/A;    TEE WITHOUT CARDIOVERSION N/A 09/03/2021   Procedure: TRANSESOPHAGEAL ECHOCARDIOGRAM (TEE);  Surgeon: Chilton Si, MD;  Location: [facility] ENDOSCOPY;  Service: Cardiovascular;  Laterality: N/A;   TEE WITHOUT CARDIOVERSION N/A 07/15/2022   Procedure: TRANSESOPHAGEAL ECHOCARDIOGRAM;  Surgeon: Thurmon Fair, MD;  [facility];  Service: Cardiovascular;  Laterality: N/A;   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 08/08/2022   MRSA bacteremia 08/08/2022   AKI (acute kidney injury), ruled out 07/27/2022   Discitis thoracic region 07/27/2022   Bacteremia due to methicillin resistant Staphylococcus aureus 07/27/2022   Severe protein-calorie malnutrition (HCC) 07/26/2022   PVD (peripheral vascular disease) (HCC) 07/26/2022   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   Bacteremia 07/15/2022   Acute respiratory failure with hypoxia (HCC) 07/12/2022   Atelectasis 07/12/2022   Sepsis due to pneumonia (HCC) 07/12/2022   Gangrene of toe of right foot (HCC)    Sepsis (HCC) 08/29/2021   SIRS (systemic inflammatory response syndrome) (HCC) 08/29/2021   Hyponatremia 08/29/2021   Hypomagnesemia 08/29/2021   Pure hypercholesterolemia 06/17/2021   Pain of left hand 09/06/2019   Cellulitis 08/26/2019   Persistent atrial fibrillation (HCC) 08/26/2019   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) 10/18/2018    Transaminitis 10/17/2018   Hyperbilirubinemia 10/17/2018   Leukocytosis 10/17/2018   Sinusitis 10/17/2018   Chronic anticoagulation 09/30/2017   Smoker 09/30/2017   Cardiomyopathy (HCC) 09/30/2017   Sleep apnea suspected 09/30/2017   Essential hypertension 09/26/2017   Uncontrolled type 2 diabetes with hyperglycemia and hypoglycemia, insulin long term use 09/26/2017   Atrial fibrillation with RVR (HCC) 09/26/2017    ONSET DATE: 08/20/22  REFERRING DIAG: M46.44ICD-10-CMDiscitis of thoracic region  THERAPY DIAG:  Unsteadiness on feet  Difficulty in walking, not elsewhere classified  Muscle weakness (generalized)  Other abnormalities of gait and mobility  Rationale for Evaluation and Treatment: Rehabilitation  SUBJECTIVE:  SUBJECTIVE STATEMENT: Doing good, working really hard in aquatic PT, legs were sore but back did fine  Pt accompanied by: self  PERTINENT HISTORY:  Per patient, he was admitted to the local hospital on Aug 20, 2022 and had back surgery thereafter. Patient was then readmitted after being discharged for infection with low BG. He was discharged on October 28, 2022 on oral antibiotics with changes to his insulin regimen. past medical history of PVD, type 2 diabetes with hyperglycemia/hypoglycemia, seizure disorders, hypertension, hyperlipidemia, A-fib (on Eliquis) and recent bacteremia.  He is s/p angioplasty.  Patient reports since discharge he has been doing physical therapy/exercises at home but continues to have severe back pain    PAIN:  Are you having pain? Yes: NPRS scale: 6/10 Pain location: midline back Pain description: stabbing Aggravating factors: activity and with rest Relieving factors: pain medication  PRECAUTIONS: Back  RED FLAGS:    WEIGHT BEARING  RESTRICTIONS: No  FALLS: Has patient fallen in last 6 months? No  LIVING ENVIRONMENT: Lives with: lives with their spouse Lives in: House/apartment, pt reports housing issues since out of work Stairs: No Has following equipment at home: Environmental consultant - 2 wheeled  PLOF: Independent, previously worked as Photographer  PATIENT GOALS: be able to return to normal, walking without AD  OBJECTIVE:    TODAY'S TREATMENT: 02/09/23 Activity Comments  Seated [facility] PRE -resisted DF 5# 3x10 -LAQ 3x10 5# -standing march 3x20 5#, 30 sec rest btwn sets -sidestepping x 2 min 5# along counter  Gait training -trials with trekking poles outside on concrete sidewalk with CGA to progress mobility/endurance.  -trials w/ NBQC and foot-up device left ankle to improve foot clearance in swing and to facilitate heel strike initial contact                 TODAY'S TREATMENT: 02/07/23  Aquatic therapy at [facility] - pool temperature 91 degrees   Patient seen for aquatic therapy today.  Treatment took place in water 3.6-4.8 feet deep depending upon activity.  Patient entered and exited the pool via step-to pattern of stair negotiation w/ bilateral rails at [facility] level.   Warm Up:  Walking forwards approx 18' x 4 laps, backwards x 4 laps with yellow noodle for external support.  Min cues for posture throughout and for increased stride length, esp with backwards walking.    Strengthening/balance:  Side stepping x 2 laps holding smaller barbells abd arms to surface of water when LEs abd, performing squat then adducting arms/legs.  PT providing min A throughout due to intermittent LOB during squat and to provide tactile facilitation for correct squat technique.  Forward marching with larger barbells for support and min A from PT (holding barbells) x 2 laps.  Braiding x 1 lap each direction with support from noodle and min A from PT with max verbal cues for technique.  Stepping strategy stepping  forward/laterally/backwards x 4 rounds on each side again with noodle and min A from PT for support.  Cues for full weight shift towards stepping leg and single step to return to midline.  Posterior stepping was most difficult.  Ai Chi "Accepting" for ant/post weight shift with PT guiding pts hands for adequate arm movement and to guide weight shift.  Performed x 10 reps each side.    Sit<>stand from pool bench with small step under feet, reaching forward with BUEs for forward weight shift and trunk lean x 10 reps with PT initially providing min facilitation for full weight shift fading  to S level with repetition.  Step ups with wall for single UE support initially fading to no UE support x 10 reps on each side.  Side step ups/down x 10 reps each direction again with light support and mod cuing for wide step to ensure room for inside foot to land.    LE kicks holding onto wall with PT providing support at pelvis/hips to ensure horizontal position x 2 sets of 10 kicks.      Patient requires buoyancy of the water for support for reduced fall risk with gait training and balance exercises with CGA support. Exercises able to be performed safely in water without the risk of fall compared to those same exercises performed on land; viscosity of water needed for resistance for strengthening. Current of water provides perturbations for challenging static and dynamic balance.   PATIENT EDUCATION: Education details: Focus of aquatic session. Person educated: Patient Education method: Explanation, Demonstration, Tactile cues, and Verbal cues Education comprehension: verbalized understanding    HOME EXERCISE PROGRAM: Access Code: [email or web address] URL: [email or web address] Date: 01/10/2023 Prepared by: [facility] - Outpatient  Rehab - [facility]  Exercises - Seated Knee Extension with Resistance  - 1 x daily - 7 x weekly - 3 sets - 10 reps - Seated Hamstring Curls with Resistance  - 1 x daily -  7 x weekly - 3 sets - 10 reps - Seated Hip Abduction with Resistance  - 1 x daily - 7 x weekly - 3 sets - 10 reps - Seated Hip Adduction Isometrics with Ball  - 1 x daily - 7 x weekly - 3 sets - 10 reps - Hooklying Clamshell with Resistance  - 1 x daily - 7 x weekly - 3 sets - 10 reps - Seated Shoulder Horizontal Abduction with Resistance  - 1 x daily - 7 x weekly - 3 sets - 10 reps - Seated Shoulder Row with Resistance Anchored at Feet  - 1 x daily - 7 x weekly - 3 sets - 10 reps - Alternating Step Taps with Counter Support  - 1 x daily - 5 x weekly - 2 sets - 10 reps - Standing Gastroc Stretch at Counter  - 1-2 x daily - 7 x weekly - 1 sets - 3 reps - 30 sec hold  ---------------------------------------------------------------------- DIAGNOSTIC FINDINGS: IMPRESSION: 1. Significantly regressed bilateral pleural effusions and improved bilateral ventilation since last month. 2. No new cardiopulmonary abnormality  COGNITION: Overall cognitive status: Within functional limits for tasks assessed   SENSATION: Proprioception: Impaired  Decreased LLE > RLE COORDINATION: Minimal impairment LLE  EDEMA:  none  MUSCLE TONE: NT      POSTURE: No Significant postural limitations  LOWER EXTREMITY ROM:     Active  Right Eval Left Eval  Hip flexion    Hip extension    Hip abduction    Hip adduction    Hip internal rotation    Hip external rotation    Knee flexion 120 120  Knee extension 0 0  Ankle dorsiflexion 15 10  Ankle plantarflexion    Ankle inversion    Ankle eversion     (Blank rows = not tested)  LOWER EXTREMITY MMT:    MMT Right Eval Left Eval  Hip flexion 3+ 3+  Hip extension    Hip abduction 3+ 3+  Hip adduction 4 4  Hip internal rotation    Hip external rotation    Knee flexion 4+ 4  Knee extension 5 4-  Ankle dorsiflexion  4+ 3+  Ankle plantarflexion 3+ 3+  Ankle inversion    Ankle eversion    (Blank rows = not tested)  BED MOBILITY:  Modified  indep due to back precautions  TRANSFERS: Assistive device utilized: Environmental consultant - 2 wheeled  Sit to stand: Modified independence Stand to sit: Modified independence Chair to chair: Modified independence Floor:  NT    CURB:  Level of Assistance: SBA Assistive device utilized: Environmental consultant - 2 wheeled Curb Comments:   STAIRS: Level of Assistance: SBA Stair Negotiation Technique: Step to Pattern Alternating Pattern  with Bilateral Rails Number of Stairs: 8  Height of Stairs: 4-6"  Comments:   GAIT: Gait pattern: Left foot flat Distance walked:  Assistive device utilized: Walker - 2 wheeled Level of assistance: Modified independence Comments: ambulates with good stride, decreased speed. Without AD ambulates with past retracts  FUNCTIONAL TESTS:  5 times sit to stand: 25 sec w/ BUE Timed up and go (TUG): 19 sec  M-CTSIB  Condition 1: Firm Surface, EO 30 Sec, Moderate Sway  Condition 2: Firm Surface, EC 10 Sec,  left LOB  Sway  Condition 3: Foam Surface, EO  Sec,  Sway  Condition 4: Foam Surface, EC  Sec,  Sway         GOALS: Goals reviewed with patient? Yes  SHORT TERM GOALS: Target date: 01/11/2023    Patient will be independent in HEP to improve functional outcomes Baseline: Goal status: IN PROGRESS  2.  Patient will achieve 12 seconds for TUG test to manifest reduced risk for falls Baseline: 19 w/ RW>17.38 sec 01/10/2023; 13.69 sec 01/25/23 Goal status: PARTIALLY MET, 01/25/23  3.  Demo improved BLE strength and reduced risk for falls per time 15 sec 5xSTS test Baseline: 25 sec w/ BUE push-off; 14.66 sec with UE support at chair; 17.3 sec with hands on knees Goal status: MET, 01/10/2023  4. Demo modified independent stair ambulation with reciprocal pattern and single HR to improve community mobility  Baseline: SBA, BHR  Goal status: IN PROGRESS  LONG TERM GOALS: Target date: 02/22/2023    Demo low risk for falls per score 50/56 Berg Balance Test Baseline:  38/56; 44/56 01/25/23 Goal status: IN PROGRESS 01/25/23  2.  Demo independent ambulation level surfaces w/out AD x 150 ft to improve independence and safety with household ambulation Baseline: dependent on RW; NT d/t time 01/25/23 Goal status: IN PROGRESS 01/25/23  3.  Demo gross BLE strength to 4+/5 to improve activity tolerance and stability for unsupported standing/gait Baseline: B hip flexion, L knee extension 01/25/23 Goal status: IN PROGRESS 01/25/23     ASSESSMENT:  CLINICAL IMPRESSION: Initiated session with [facility] PRE with 5# weights and increased reps per set to improve muscular endurance. Gait training w/ trekking poles to improve efficiency with ambulation outdoors. Unfortunately, when walking across w/c cut-out in curb left ankle inversion occurred in two instances requiring CGA-min A to correct LOB. Denies any pain initially. Returned into clinic and examination of left ankle reveals swelling around left lateral malleolus. No overt laxity to anterior draw of left ankle, laxity noted with talar tilt and some discomfort to posterior, distal aspect of fibula.  Due to his impaired sensation in the LE I encouraged conservative measures of elevation, ice, compression for time being and advised on monitoring for signs of swelling, redness, and increased pain to weightbearing or feeling of weakness due to pre-existing sensory impairments affecting leg.  Applied kinesiotape for lateral stabilization and pt resting in waiting room with  LLE elevated and ice applied to ankle.  Verbalizes understanding of monitoring for adverse signs.   OBJECTIVE IMPAIRMENTS: Abnormal gait, cardiopulmonary status limiting activity, decreased activity tolerance, decreased balance, decreased coordination, decreased endurance, decreased knowledge of use of DME, difficulty walking, decreased strength, impaired perceived functional ability, and pain.   ACTIVITY LIMITATIONS: carrying, lifting, bending, standing,  squatting, stairs, transfers, bathing, dressing, reach over head, and locomotion level  PARTICIPATION LIMITATIONS: meal prep, cleaning, laundry, driving, shopping, community activity, occupation, and yard work  PERSONAL FACTORS: Time since onset of injury/illness/exacerbation and 1-2 comorbidities: PMH  are also affecting patient's functional outcome.   REHAB POTENTIAL: Excellent  CLINICAL DECISION MAKING: Evolving/moderate complexity  EVALUATION COMPLEXITY: Moderate  PLAN:  PT FREQUENCY: 1-2x/week  PT DURATION: 4 weeks  PLANNED INTERVENTIONS: Therapeutic exercises, Therapeutic activity, Neuromuscular re-education, Balance training, Gait training, Patient/Family education, Self Care, Joint mobilization, Stair training, Vestibular training, Canalith repositioning, Orthotic/Fit training, DME instructions, Aquatic Therapy, Electrical stimulation, Cryotherapy, Moist heat, and Manual therapy  PLAN FOR NEXT SESSION:  Review updates to HEP and finish assessing STGs.  Try step ups/step downs (?) for strengthening, corner balance activities, general BLE strength.  Gait training with cane    12:45 PM, 02/09/23 M. Shary Decamp, PT, DPT Physical Therapist- [place] Office Number: [phone]

## 2023-02-10 ENCOUNTER — Other Ambulatory Visit (HOSPITAL_COMMUNITY): Payer: Self-pay

## 2023-02-10 ENCOUNTER — Other Ambulatory Visit: Payer: Self-pay

## 2023-02-10 NOTE — Patient Outreach (Signed)
Medicaid Managed Care Social Work Note  02/10/2023 Name:  David [name] MRN:  [id number] DOB:  05-31-1960  David [name] is an 62 y.o. year old male who is a primary patient of David Register, MD.  The Medicaid Managed Care Coordination team was consulted for assistance with:  Community Resources   David [name] was given information about Medicaid Managed Care Coordination team services today. David [name] Patient agreed to services and verbal consent obtained.  Engaged with patient  for by telephone forfollow up visit in response to referral for case management and/or care coordination services.   Assessments/Interventions:  Review of past medical history, allergies, medications, health status, including review of consultants reports, laboratory and other test data, was performed as part of comprehensive evaluation and provision of chronic care management services.  SDOH: (Social Determinant of Health) assessments and interventions performed: SDOH Interventions    Flowsheet Row Telephone from 09/09/2021 in [facility]  SDOH Interventions   Transportation Interventions Patient Resources (Friends/Family)     BSW completed a telephone outreach with patient, he states he and his wife were able to find housing with a family member that needed some assistance. Patient states the only issue he is having now is with his pain management doctor. Patient states no other resources are needed a this time. BSW contacted [facility] and left a message for the doctor.   Advanced Directives Status:  Not addressed in this encounter.  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   Adjustment disorder with mixed anxiety and depressed mood 08/13/2022   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 08/08/2022   MRSA bacteremia 08/08/2022   AKI (acute kidney injury), ruled out 07/27/2022   Discitis thoracic region 07/27/2022   Bacteremia due to methicillin resistant Staphylococcus aureus 07/27/2022   Severe protein-calorie malnutrition (HCC) 07/26/2022   PVD (peripheral vascular disease) (HCC) 07/26/2022   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   Bacteremia 07/15/2022   Acute respiratory failure with hypoxia (HCC) 07/12/2022   Atelectasis 07/12/2022   Sepsis due to pneumonia (HCC) 07/12/2022   Gangrene of toe of right foot (HCC)    Sepsis (HCC) 08/29/2021   SIRS (systemic inflammatory response syndrome) (HCC) 08/29/2021   Hyponatremia 08/29/2021   Hypomagnesemia 08/29/2021   Pure hypercholesterolemia 06/17/2021   Pain of left hand 09/06/2019   Cellulitis 08/26/2019   Persistent atrial fibrillation (HCC) 08/26/2019   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) 10/18/2018   Transaminitis 10/17/2018   Hyperbilirubinemia 10/17/2018   Leukocytosis 10/17/2018   Sinusitis 10/17/2018   Chronic anticoagulation 09/30/2017   Smoker 09/30/2017   Cardiomyopathy (HCC) 09/30/2017   Sleep apnea suspected 09/30/2017   Essential hypertension 09/26/2017   Uncontrolled type 2 diabetes with hyperglycemia and hypoglycemia, insulin long term use 09/26/2017   Atrial fibrillation with RVR (HCC) 09/26/2017  Conditions to be addressed/monitored per PCP order:   housing  There are no care plans that you recently modified to display for this  patient.   Follow up:  Patient agrees to Care Plan and Follow-up.  Plan: The  Patient has been provided with contact information for the Managed Medicaid care management team and has been advised to call with any health related questions or concerns.    David [name], MHA [facility]  Managed [facility] Social Worker [phone]

## 2023-02-10 NOTE — Patient Instructions (Signed)
Visit Information  Mr. Rafiq was given information about Medicaid Managed Care team care coordination services as a part of their [employer] Medicaid benefit. [name] verbally consentedto engagement with the [facility] Managed Care team.   If you are experiencing a medical emergency, please call 911 or report to your local emergency department or urgent care.   If you have a non-emergency medical problem during routine business hours, please contact your provider's office and ask to speak with a nurse.   For questions related to your [employer] [facility] health plan, please call: [phone]  OR visit the member homepage at: [email or web address]  If you would like to schedule transportation through your [facility] plan, please call the following number at least 2 days in advance of your appointment: [phone]  If you are experiencing a behavioral health crisis, call the [employer] [facility] Crisis Line at [phone]). The line is available 24 hours a day, seven days a week.  If you would like help to quit smoking, call 1-800-QUIT-NOW (845-417-7601) OR Espaol: 1-855-Djelo-Ya (2-595-638-7564) o para ms informacin haga clic aqu or Text READY to 332-951 to register via text   Mr. Lowell Guitar - following are the goals we discussed in your visit today:   Goals Addressed   None       The  Patient                                              has been provided with contact information for the Managed Medicaid care management team and has been advised to call with any health related questions or concerns.   Gus Puma, Kenard Gower, MHA [facility]  Managed Medicaid Social Worker [phone]   Following is a copy of your plan of care:  There are no care plans that you recently modified to display for this patient.

## 2023-02-14 ENCOUNTER — Ambulatory Visit: Payer: Medicaid Other | Attending: Family

## 2023-02-17 ENCOUNTER — Ambulatory Visit: Payer: Medicaid Other | Admitting: Physical Therapy

## 2023-02-20 ENCOUNTER — Emergency Department (HOSPITAL_COMMUNITY): Payer: Medicaid Other

## 2023-02-20 ENCOUNTER — Emergency Department (HOSPITAL_COMMUNITY)
Admission: EM | Admit: 2023-02-20 | Discharge: 2023-02-21 | Disposition: A | Discharge status: Home / Self Care | Payer: Medicaid Other | Attending: Emergency Medicine | Admitting: Emergency Medicine

## 2023-02-20 ENCOUNTER — Other Ambulatory Visit (HOSPITAL_COMMUNITY): Payer: Self-pay

## 2023-02-20 DIAGNOSIS — M25572 Pain in left ankle and joints of left foot: Secondary | ICD-10-CM | POA: Diagnosis present

## 2023-02-20 DIAGNOSIS — R0781 Pleurodynia: Secondary | ICD-10-CM | POA: Diagnosis not present

## 2023-02-20 DIAGNOSIS — E119 Type 2 diabetes mellitus without complications: Secondary | ICD-10-CM | POA: Insufficient documentation

## 2023-02-20 DIAGNOSIS — X501XXA Overexertion from prolonged static or awkward postures, initial encounter: Secondary | ICD-10-CM | POA: Diagnosis not present

## 2023-02-20 DIAGNOSIS — I4891 Unspecified atrial fibrillation: Secondary | ICD-10-CM | POA: Insufficient documentation

## 2023-02-20 DIAGNOSIS — Z7901 Long term (current) use of anticoagulants: Secondary | ICD-10-CM | POA: Diagnosis not present

## 2023-02-20 DIAGNOSIS — I509 Heart failure, unspecified: Secondary | ICD-10-CM | POA: Diagnosis not present

## 2023-02-20 DIAGNOSIS — I110 Hypertensive heart disease with heart failure: Secondary | ICD-10-CM | POA: Diagnosis not present

## 2023-02-20 DIAGNOSIS — Z794 Long term (current) use of insulin: Secondary | ICD-10-CM | POA: Diagnosis not present

## 2023-02-20 DIAGNOSIS — M546 Pain in thoracic spine: Secondary | ICD-10-CM | POA: Insufficient documentation

## 2023-02-20 DIAGNOSIS — Y9301 Activity, walking, marching and hiking: Secondary | ICD-10-CM | POA: Insufficient documentation

## 2023-02-20 DIAGNOSIS — S9002XA Contusion of left ankle, initial encounter: Secondary | ICD-10-CM | POA: Insufficient documentation

## 2023-02-20 DIAGNOSIS — W19XXXA Unspecified fall, initial encounter: Secondary | ICD-10-CM

## 2023-02-20 MED ORDER — OXYCODONE-ACETAMINOPHEN 5-325 MG PO TABS
2.0000 | ORAL_TABLET | Freq: Once | ORAL | Status: AC
Start: 2023-02-20 — End: 2023-02-20
  Administered 2023-02-20: 2 via ORAL
  Filled 2023-02-20: qty 2

## 2023-02-20 MED ORDER — LIDOCAINE 5 % EX PTCH
1.0000 | MEDICATED_PATCH | CUTANEOUS | Status: DC
  Administered 2023-02-20: 1 via TRANSDERMAL
  Filled 2023-02-20: qty 1

## 2023-02-20 NOTE — ED Triage Notes (Addendum)
Pt reports fall x 2 days ago because of his cane, twisted his left ankle and it threw him off balance, and he fell into a metal box on the right side of rib cage. Pt c/o left ankle pain, right rib cage pain and back pain. Pt reports many rods in back and wants to make sure he didn't throw them out of place. Pt reports that he did hit head on wall, is on Eliquis but head is not hurting

## 2023-02-20 NOTE — ED Provider Notes (Signed)
[place] EMERGENCY DEPARTMENT AT [facility] Provider Note   CSN: [id number] Arrival date & time: 02/20/23  2157     History {Add pertinent medical, surgical, social history, OB history to HPI:1} Chief Complaint  Patient presents with   David [name] is a 62 y.o. male.   Fall   62 year old male presents emergency department with complaints of fall.  Patient reports falling 2 days ago when he missed placed his cane when he was walking causing him to roll his left ankle falling on his right side.  States that he hit his right chest on a metal box that was nearby and struck the back of his head against a nearby wall.  Patient denies loss of consciousness but is on Eliquis.  States he was trying to manage his symptoms at home but due to symptoms not improving, prompted visit emergency department.  Denies any visual disturbance, gait abnormality from baseline, slurred speech, facial droop, weakness/sensory deficits in upper or lower extremities.  Denies any shortness of breath, abdominal pain, nausea, vomiting.  Patient has been able to ambulate on left foot/ankle but with pain.  Past medical history significant for diabetes mellitus type 2, atrial fibrillation on Eliquis, CHF, cardiomyopathy, hypertension,  Home Medications Prior to Admission medications   Medication Sig Start Date End Date Taking? Authorizing Provider  Accu-Chek Softclix Lancets lancets Use to check blood sugar 3 times daily. 11/15/22   [name], MD  apixaban (ELIQUIS) 5 MG TABS tablet Take 1 tablet (5 mg total) by mouth 2 (two) times daily. 10/31/22   Eleonore Chiquito, FNP  bisacodyl (DULCOLAX) 10 MG suppository Place 1 suppository (10 mg total) rectally daily as needed for moderate constipation. 10/21/22   Kathlen Mody, MD  Blood Glucose Monitoring Suppl (ACCU-CHEK GUIDE) w/Device KIT Use to check blood sugar 3 times daily. E11.9 11/15/22   [name], MD  busPIRone (BUSPAR) 15 MG tablet  Take 1 tablet (15 mg total) by mouth 2 (two) times daily. 10/28/22   Burnadette Pop, MD  carvedilol (COREG) 3.125 MG tablet Take 1 tablet (3.125 mg total) by mouth 2 (two) times daily. 10/28/22   Burnadette Pop, MD  digoxin (LANOXIN) 0.125 MG tablet Take 1 tablet (125 mcg total) by mouth once daily. 10/28/22   Burnadette Pop, MD  doxycycline (VIBRA-TABS) 100 MG tablet Take 1 tablet (100 mg total) by mouth every 12 (twelve) hours. 10/28/22   Burnadette Pop, MD  DULoxetine (CYMBALTA) 20 MG capsule Take 1 capsule (20 mg total) by mouth daily. 10/28/22   Burnadette Pop, MD  famotidine (PEPCID) 20 MG tablet Take 1 tablet (20 mg total) by mouth 2 times daily. 10/28/22   Burnadette Pop, MD  ferrous sulfate 325 (65 FE) MG tablet Take 1 tablet (325 mg total) by mouth every other day. 10/28/22   Burnadette Pop, MD  finasteride (PROSCAR) 5 MG tablet Take 1 tablet (5 mg total) by mouth daily. 10/28/22   Burnadette Pop, MD  fluticasone (FLONASE) 50 MCG/ACT nasal spray Place 2 sprays into both nostrils daily. 10/21/22   Kathlen Mody, MD  furosemide (LASIX) 40 MG tablet Take 1/2 tablet (20 mg total) by mouth daily. 10/28/22   Burnadette Pop, MD  glucose blood (ACCU-CHEK GUIDE) test strip Use to check blood sugar 3 times daily. 11/15/22   [name], MD  HYDROcodone-acetaminophen (NORCO) 10-325 MG tablet Take 1 tablet by mouth every 4 (four) hours as needed for pain. 12/30/22     insulin glargine (  LANTUS SOLOSTAR) 100 UNIT/ML Solostar Pen Inject 25 Units into the skin daily. 12/29/22   [name], MD  Insulin Pen Needle (PEN NEEDLES) 32G X 4 MM MISC Use to inject insulin once daily. 11/15/22   [name], MD  lidocaine 4 % Place 1 patch onto the skin daily. 10/28/22   Burnadette Pop, MD  liver oil-zinc oxide (DESITIN) 40 % ointment Apply topically 2 (two) times daily. 10/21/22   Kathlen Mody, MD  magnesium oxide (MAG-OX) 400 (240 Mg) MG tablet Take 1 tablet (400 mg total) by mouth daily. 10/28/22    Burnadette Pop, MD  methocarbamol (ROBAXIN) 750 MG tablet Take 1 tablet (750 mg total) by mouth 3 (three) times daily as needed for muscle spasms 11/09/22     naloxone (NARCAN) nasal spray 4 mg/0.1 mL Opiates overdose 10/21/22   Kathlen Mody, MD  polyethylene glycol powder (GLYCOLAX/MIRALAX) 17 GM/SCOOP powder Take 17 g by mouth daily. 12/29/22   [name], MD  pregabalin (LYRICA) 75 MG capsule Take 1 capsule (75 mg total) by mouth at bedtime. 02/03/23   [name], MD  pregabalin (LYRICA) 75 MG capsule Take 1 capsule (75 mg total) by mouth at bedtime for 5 days. 02/05/23 02/10/23  Lockie Mola, MD  rosuvastatin (CRESTOR) 20 MG tablet Take 1 tablet (20 mg total) by mouth daily. 01/10/23   [name], MD  sacubitril-valsartan (ENTRESTO) 49-51 MG Take 1 tablet by mouth 2 (two) times daily. 10/28/22   Burnadette Pop, MD  senna-docusate (SENOKOT-S) 8.6-50 MG tablet Take 1 tablet by mouth 2 (two) times daily. 10/28/22   Burnadette Pop, MD  tamsulosin (FLOMAX) 0.4 MG CAPS capsule Take 1 capsule (0.4 mg total) by mouth daily after supper. 10/28/22   Burnadette Pop, MD  terbinafine (LAMISIL) 250 MG tablet Take 1 tablet (250 mg total) by mouth daily. 12/29/22   [name], MD      Allergies    Patient has no known allergies.    Review of Systems   Review of Systems  All other systems reviewed and are negative.   Physical Exam Updated Vital Signs BP (!) 147/92 (BP Location: Left Arm)   Pulse 64   Temp 98.3 F (36.8 C) (Oral)   Resp 18   SpO2 99%  Physical Exam Vitals and nursing note reviewed.  Constitutional:      General: He is not in acute distress.    Appearance: He is well-developed.  HENT:     Head: Normocephalic and atraumatic.  Eyes:     Conjunctiva/sclera: Conjunctivae normal.  Cardiovascular:     Rate and Rhythm: Normal rate and regular rhythm.     Heart sounds: No murmur heard. Pulmonary:     Effort: Pulmonary effort is normal. No respiratory distress.      Breath sounds: Normal breath sounds.  Abdominal:     Palpations: Abdomen is soft.     Tenderness: There is no abdominal tenderness.  Musculoskeletal:        General: No swelling.     Cervical back: Neck supple.     Comments: No midline tenderness in cervical, thoracic, lumbar spine without step-off or deformity.  Paraspinal tenderness noted in the left thoracic region as well as bilaterally in lumbar region.  Right lower chest wall tenderness without obvious step-off, crepitus, deformity.  Patient with obvious swelling and ecchymosis to the lateral aspect of left ankle.  Tender to palpation lateral malleolus, base of fifth metatarsal, posterior medial malleolus.  Limited range of motion of left  ankle secondary to pain.  Otherwise, no tenderness of upper or lower extremities.  Pedal and radial pulses 2+ bilaterally.  Skin:    General: Skin is warm and dry.     Capillary Refill: Capillary refill takes less than 2 seconds.  Neurological:     Mental Status: He is alert.     Comments: Alert and oriented to self, place, time and event.   Speech is fluent, clear without dysarthria or dysphasia.   Strength 5/5 in upper/lower extremities   Sensation intact in upper/lower extremities   CN I not tested  CN II not tested CN III, IV, VI PERRLA and EOMs intact bilaterally  CN V Intact sensation to sharp and light touch to the face  CN VII facial movements symmetric  CN VIII not tested  CN IX, X no uvula deviation, symmetric rise of soft palate  CN XI 5/5 SCM and trapezius strength bilaterally  CN XII Midline tongue protrusion, symmetric L/R movements     Psychiatric:        Mood and Affect: Mood normal.     ED Results / Procedures / Treatments   Labs (all labs ordered are listed, but only abnormal results are displayed) Labs Reviewed - No data to display  EKG None  Radiology No results found.  Procedures Procedures  {Document cardiac monitor, telemetry assessment procedure when  appropriate:1}  Medications Ordered in ED Medications  oxyCODONE-acetaminophen (PERCOCET/ROXICET) 5-325 MG per tablet 2 tablet (has no administration in time range)  lidocaine (LIDODERM) 5 % 1 patch (has no administration in time range)    ED Course/ Medical Decision Making/ A&P   {   Click here for ABCD2, HEART and other calculatorsREFRESH Note before signing :1}                              Medical Decision Making Amount and/or Complexity of Data Reviewed Radiology: ordered.  Risk Prescription drug management.   This patient presents to the ED for concern of fall, this involves an extensive number of treatment options, and is a complaint that carries with it a high risk of complications and morbidity.  The differential diagnosis includes CVA, fracture, strain/pain, dislocation, ligamentous/tendinous injury, neurovascular compromise, spinal cord injury, pneumothorax, solid organ damage, other   Co morbidities that complicate the patient evaluation  See HPI   Additional history obtained:  Additional history obtained from EMR External records from outside source obtained and reviewed including hospital records   Lab Tests:  N/a   Imaging Studies ordered:  I ordered imaging studies including CT head/cervical spine, left foot/ankle x-ray, lumbar spine x-ray, chest x-ray with right ribs I independently visualized and interpreted imaging which showed  CT head/cervical spine:*** Left foot/ankle x-ray:*** Chest x-ray with right ribs: Lumbar x-ray: I agree with the radiologist interpretation   Cardiac Monitoring: / EKG:  The patient was maintained on a cardiac monitor.  I personally viewed and interpreted the cardiac monitored which showed an underlying rhythm of: Sinus rhythm   Consultations Obtained:  N/a   Problem List / ED Course / Critical interventions / Medication management  Fall I ordered medication including Lidoderm, Percocet  Reevaluation of the  patient after these medicines showed that the patient improved I have reviewed the patients home medicines and have made adjustments as needed   Social Determinants of Health:  Former cigarette use.  Denies illicit drug use.   Test / Admission - Considered:  Fall  Vitals signs significant for hypertension blood pressure 147/92. Otherwise within normal range and stable throughout visit. Imaging studies significant for: See above *** Worrisome signs and symptoms were discussed with the patient, and the patient acknowledged understanding to return to the ED if noticed. Patient was stable upon discharge.    {Document critical care time when appropriate:1} {Document review of labs and clinical decision tools ie heart score, Chads2Vasc2 etc:1}  {Document your independent review of radiology images, and any outside records:1} {Document your discussion with family members, caretakers, and with consultants:1} {Document social determinants of health affecting pt's care:1} {Document your decision making why or why not admission, treatments were needed:1} Final Clinical Impression(s) / ED Diagnoses Final diagnoses:  None    Rx / DC Orders ED Discharge Orders     None

## 2023-02-21 ENCOUNTER — Other Ambulatory Visit (HOSPITAL_COMMUNITY): Payer: Self-pay

## 2023-02-21 MED ORDER — LIDOCAINE 5 % EX PTCH
1.0000 | MEDICATED_PATCH | CUTANEOUS | 0 refills | Status: DC
  Filled 2023-02-21 – 2023-03-03 (×3): qty 30, 30d supply, fill #0

## 2023-02-21 NOTE — Discharge Instructions (Addendum)
As discussed, workup today overall reassuring.  Imaging studies were negative for any brain bleed, fracture, dislocation or other abnormality from your fall.  Will recommend continued use of your pain medication for treatment of pain.  Will also send in lidocaine patches to place over right sided chest wall.  Recommend continue use of your incentive spirometry at home to decrease likelihood of development of pneumonia.  Recommend follow-up with primary care for reassessment of your symptoms.  Please do not hesitate to return to emergency department for worrisome signs and symptoms we discussed become apparent.

## 2023-02-28 ENCOUNTER — Other Ambulatory Visit: Payer: Self-pay

## 2023-02-28 ENCOUNTER — Encounter: Payer: Self-pay | Admitting: Pharmacist

## 2023-02-28 ENCOUNTER — Other Ambulatory Visit (HOSPITAL_COMMUNITY): Payer: Self-pay

## 2023-02-28 MED ORDER — METHOCARBAMOL 750 MG PO TABS
750.0000 mg | ORAL_TABLET | Freq: Three times a day (TID) | ORAL | 2 refills | Status: DC | PRN
  Filled 2023-02-28: qty 90, 30d supply, fill #0

## 2023-03-01 ENCOUNTER — Ambulatory Visit: Payer: Medicaid Other | Attending: Family Medicine | Admitting: Family Medicine

## 2023-03-01 ENCOUNTER — Telehealth: Payer: Self-pay

## 2023-03-01 ENCOUNTER — Other Ambulatory Visit (HOSPITAL_COMMUNITY): Payer: Self-pay

## 2023-03-01 VITALS — BP 148/85 | HR 60 | Ht 72.0 in | Wt 169.4 lb

## 2023-03-01 DIAGNOSIS — E1169 Type 2 diabetes mellitus with other specified complication: Secondary | ICD-10-CM

## 2023-03-01 DIAGNOSIS — E1159 Type 2 diabetes mellitus with other circulatory complications: Secondary | ICD-10-CM

## 2023-03-01 DIAGNOSIS — Z7901 Long term (current) use of anticoagulants: Secondary | ICD-10-CM

## 2023-03-01 DIAGNOSIS — I480 Paroxysmal atrial fibrillation: Secondary | ICD-10-CM

## 2023-03-01 DIAGNOSIS — M4644 Discitis, unspecified, thoracic region: Secondary | ICD-10-CM

## 2023-03-01 DIAGNOSIS — Z1159 Encounter for screening for other viral diseases: Secondary | ICD-10-CM

## 2023-03-01 DIAGNOSIS — I152 Hypertension secondary to endocrine disorders: Secondary | ICD-10-CM | POA: Diagnosis not present

## 2023-03-01 DIAGNOSIS — M546 Pain in thoracic spine: Secondary | ICD-10-CM | POA: Diagnosis not present

## 2023-03-01 DIAGNOSIS — E875 Hyperkalemia: Secondary | ICD-10-CM | POA: Diagnosis not present

## 2023-03-01 DIAGNOSIS — B351 Tinea unguium: Secondary | ICD-10-CM

## 2023-03-01 DIAGNOSIS — Z5982 Transportation insecurity: Secondary | ICD-10-CM

## 2023-03-01 DIAGNOSIS — Z5986 Financial insecurity: Secondary | ICD-10-CM

## 2023-03-01 DIAGNOSIS — Z794 Long term (current) use of insulin: Secondary | ICD-10-CM

## 2023-03-01 LAB — POCT GLYCOSYLATED HEMOGLOBIN (HGB A1C): HbA1c, POC (controlled diabetic range): 7.1 % — AB (ref 0.0–7.0)

## 2023-03-01 MED ORDER — LANTUS SOLOSTAR 100 UNIT/ML ~~LOC~~ SOPN
27.0000 [IU] | PEN_INJECTOR | Freq: Every day | SUBCUTANEOUS | 6 refills | Status: DC
  Filled 2023-03-01: qty 30, 111d supply, fill #0

## 2023-03-01 MED ORDER — CARVEDILOL 3.125 MG PO TABS
3.1250 mg | ORAL_TABLET | Freq: Two times a day (BID) | ORAL | 1 refills | Status: DC
  Filled 2023-03-01 – 2023-05-15 (×2): qty 180, 90d supply, fill #0

## 2023-03-01 MED ORDER — TAMSULOSIN HCL 0.4 MG PO CAPS
0.4000 mg | ORAL_CAPSULE | Freq: Every day | ORAL | 1 refills | Status: DC
  Filled 2023-03-01 – 2023-03-31 (×2): qty 90, 90d supply, fill #0
  Filled 2023-10-01: qty 90, 90d supply, fill #1

## 2023-03-01 MED ORDER — PREGABALIN 75 MG PO CAPS
75.0000 mg | ORAL_CAPSULE | Freq: Every day | ORAL | 5 refills | Status: DC
  Filled 2023-03-01 – 2023-03-03 (×3): qty 30, 30d supply, fill #0
  Filled 2023-03-31: qty 30, 30d supply, fill #1

## 2023-03-01 MED ORDER — TERBINAFINE HCL 250 MG PO TABS
250.0000 mg | ORAL_TABLET | Freq: Every day | ORAL | 1 refills | Status: DC
  Filled 2023-03-01: qty 30, 30d supply, fill #0
  Filled 2023-04-02: qty 30, 30d supply, fill #1
  Filled 2023-07-12: qty 30, 30d supply, fill #2

## 2023-03-01 MED ORDER — MISC. DEVICES MISC: 0 refills | Status: DC

## 2023-03-01 NOTE — Telephone Encounter (Signed)
At the request of Dr Alvis Lemmings, I contacted [facility] [place] to inform Dr Joyce Gross that the patient injured his ankle 02/20/2023. As a result, he has been taking more pain medication than usual due to the ankle pain and will run out of pain medication prior to his appointment with Dr Joyce Gross on 03/21/2023. Dr Alvis Lemmings stated he has called [facility] himself but has not heard back from anyone. He is requesting additional pain medication.    I spoke to [place] [place] call center and explained patient's concerns/ request and asked that someone please call the patient.  I confirmed him phone number.  I then met with the patient in the exam room to explain to him my call to [facility] and he was actually speaking to a representative at [facility] when I entered the room.  He shared his request for additional pain medication and they told him that they would need to discuss with the provider.

## 2023-03-01 NOTE — Progress Notes (Signed)
Subjective:  Patient ID: David [name], male    DOB: 1960-04-11  Age: 62 y.o. MRN: [id number]  CC: Medical Management of Chronic Issues (Recent fall/Nail fungus)   HPI David [name] is a 62 y.o. 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  He presents with multiple complaints. He recently experienced a fall in the dark due to stepping on a fallen pillow, resulting in a twisted left ankle and rib pain. The patient visited the emergency room to ensure no broken bones or damage to the hardware in his back.  He is currently wearing a left ankle brace and is requesting a new prescription as recommended by PT.  He reports ongoing back pain, which is a daily occurrence and primarily located in the mid-back region. The patient is also managing his pain with hydrocodone, prescribed by his pain clinic doctor. However, due to the increased pain from his recent fall, he has been taking more than the prescribed amount and is concerned about running out before his next appointment. He has attempted to contact the pain clinic about this issue but has not received a response. I had prescribed Cymbalta however he could not tolerate this due to nausea and this was changed to Lyrica which he is tolerating.  His gait has improved.  He has been progressing from a wheelchair to a walker, and now to a cane, with the goal of walking unaided. He is currently undergoing physical therapy and neuroclonic therapy to aid in his recovery.   In terms of his diabetes management, the  patient's recent A1c level was 7.1, higher than his previous level of 5.5. His fasting sugars at home are typically between 130 and 150. He expresses a desire to keep his A1c level below 7 and is open to adjusting his insulin dosage to achieve this.   He complains of an ongoing fungal infection in his fingernails for which I had prescribed terbinafine.  He reports he has noticed slight improvement but this is slow.  For his atrial fibrillation he remains on Eliquis and he is on carvedilol.  He has not been to see his cardiologist in a while.  Past Medical History:  Diagnosis Date   Cardiomyopathy (HCC)    a. EF 45% in 2019.   CHF (congestive heart failure) (HCC)    Chronic anticoagulation 09/30/2017   Diabetes mellitus type 2 in nonobese [facility])    Discitis of thoracic region 09/25/2022   Does not have health insurance    Essential hypertension 09/26/2017   Financial difficulties    H/O noncompliance with medical treatment, presenting hazards to health    Hardware complicating wound infection (HCC) 09/25/2022   Non-insulin treated type 2 diabetes mellitus (HCC) 09/26/2017   Persistent atrial fibrillation (HCC)    Sleep apnea suspected 09/30/2017    Past Surgical History:  Procedure Laterality Date   ABDOMINAL AORTOGRAM W/LOWER EXTREMITY N/A 09/02/2021   Procedure: ABDOMINAL AORTOGRAM W/LOWER EXTREMITY;  Surgeon: Nada Libman, MD;  [facility]: [facility];  Service: Cardiovascular;  Laterality: N/A;   ABDOMINAL AORTOGRAM W/LOWER EXTREMITY N/A 12/28/2021  Procedure: ABDOMINAL AORTOGRAM W/LOWER EXTREMITY;  Surgeon: Nada Libman, MD;  Location: [facility];  Service: Cardiovascular;  Laterality: N/A;   ABDOMINAL AORTOGRAM W/LOWER EXTREMITY N/A 07/28/2022   Procedure: ABDOMINAL AORTOGRAM W/LOWER EXTREMITY;  Surgeon: Cephus Shelling, MD;  Location: [facility];  Service: Cardiovascular;  Laterality: N/A;   AMPUTATION Right 09/04/2021   Procedure: AMPUTATION 4th  RAY  FOOT;  Surgeon: Nadara Mustard, MD;  Location: [facility];  Service: Orthopedics;  Laterality: Right;   AMPUTATION Right 08/02/2022   Procedure: AMPUTATION RAY 5TH METATARSAL OF RIGHT FOOT;  Surgeon: [name], MD;  Location: [facility] OR;  Service: Orthopedics;  Laterality: Right;   APPENDECTOMY  1971   BONE BIOPSY Right 08/02/2022   Procedure: BONE BIOPSY OF 5TH METATARSAL RIGHT FOOT;  Surgeon: [name], MD;  Location: [facility];  Service: Orthopedics;  Laterality: Right;   CARDIOVERSION N/A 09/28/2017   Procedure: CARDIOVERSION;  Surgeon: Thurmon Fair, MD;  [facility]: [facility];  Service: Cardiovascular;  Laterality: N/A;   IRRIGATION AND DEBRIDEMENT FOOT  08/02/2022   Procedure: IRRIGATION AND DEBRIDEMENT RIGHT 5TH METATARSAL FOOT;  Surgeon: [name], MD;  Location: [facility] OR;  Service: Orthopedics;;   LAMINECTOMY WITH POSTERIOR LATERAL ARTHRODESIS LEVEL 4 N/A 08/30/2022   Procedure: THORACIC FIVE-THORACIC ELEVEN FUSION, THORACIC EIGHT TRANSPEDICULAR DECOMPRESSION AND PARTIAL CORPECTOMY with O-Arm;  Surgeon: Bedelia Person, MD;  Location: [facility] OR;  Service: Neurosurgery;  Laterality: N/A;   PERIPHERAL VASCULAR BALLOON ANGIOPLASTY  09/02/2021   Procedure: PERIPHERAL VASCULAR BALLOON ANGIOPLASTY;  Surgeon: Nada Libman, MD;  [facility];  Service: Cardiovascular;;   PERIPHERAL VASCULAR BALLOON ANGIOPLASTY  12/28/2021   Procedure: PERIPHERAL VASCULAR BALLOON ANGIOPLASTY;  Surgeon: Nada Libman, MD;  [facility];  Service: Cardiovascular;;  Posterior Tibial PTA only   TEE WITHOUT CARDIOVERSION N/A 09/28/2017   Procedure: TRANSESOPHAGEAL ECHOCARDIOGRAM (TEE);  Surgeon: Thurmon Fair, MD;  Location: [facility] ENDOSCOPY;  Service: Cardiovascular;  Laterality: N/A;   TEE WITHOUT CARDIOVERSION N/A 09/03/2021   Procedure: TRANSESOPHAGEAL ECHOCARDIOGRAM (TEE);  Surgeon: Chilton Si, MD;  Location: [facility] ENDOSCOPY;  Service: Cardiovascular;  Laterality: N/A;    TEE WITHOUT CARDIOVERSION N/A 07/15/2022   Procedure: TRANSESOPHAGEAL ECHOCARDIOGRAM;  Surgeon: Thurmon Fair, MD;  [facility];  Service: Cardiovascular;  Laterality: N/A;    Family History  Problem Relation Age of Onset   Hypertension Mother     Social History   Socioeconomic History   Marital status: Married    Spouse name: Not on file   Number of children: Not on file   Years of education: Not on file   Highest education level: Some college, no degree  Occupational History   Not on file  Tobacco Use   Smoking status: Former    Current packs/day: 0.00    Types: Cigarettes    Start date: 03/19/2003    Quit date: 03/18/2018    Years since quitting: 4.9   Smokeless tobacco: Never   Tobacco comments:    09/26/2017 "2-3 cigarettes/month now"  Vaping Use   Vaping status: Never Used  Substance and Sexual Activity   Alcohol use: Not Currently    Alcohol/week: 3.0 standard drinks of alcohol    Types: 3 Cans of beer per week    Comment: States he quit drinking 8-9 months ago   Drug use: Never   Sexual activity: Not Currently  Other Topics Concern   Not on file  Social History Narrative  Not on file   Social Determinants of Health   Financial Resource Strain: Medium Risk (03/01/2023)   Overall Financial Resource Strain (CARDIA)    Difficulty of Paying Living Expenses: Somewhat hard  Food Insecurity: Food Insecurity Present (03/01/2023)   Hunger Vital Sign    Worried About Running Out of Food in the Last Year: Sometimes true    Ran Out of Food in the Last Year: Sometimes true  Transportation Needs: Unmet Transportation Needs (03/01/2023)   PRAPARE - Administrator, Civil Service (Medical): Yes    Lack of Transportation (Non-Medical): Patient declined  Physical Activity: Insufficiently Active (03/01/2023)   Exercise Vital Sign    Days of Exercise per Week: 3 days    Minutes of Exercise per Session: 30 min  Stress: Stress Concern Present  (03/01/2023)   [employer] of Occupational Health - Occupational Stress Questionnaire    Feeling of Stress : To some extent  Social Connections: Unknown (03/01/2023)   Social Connection and Isolation Panel [NHANES]    Frequency of Communication with Friends and Family: Once a week    Frequency of Social Gatherings with Friends and Family: Once a week    Attends Religious Services: Patient declined    Database administrator or Organizations: No    Attends Engineer, structural: Not on file    Marital Status: Married    No Known Allergies  Outpatient Medications Prior to Visit  Medication Sig Dispense Refill   Accu-Chek Softclix Lancets lancets Use to check blood sugar 3 times daily. 100 each 6   apixaban (ELIQUIS) 5 MG TABS tablet Take 1 tablet (5 mg total) by mouth 2 (two) times daily. 60 tablet 1   bisacodyl (DULCOLAX) 10 MG suppository Place 1 suppository (10 mg total) rectally daily as needed for moderate constipation. 12 suppository 0   Blood Glucose Monitoring Suppl (ACCU-CHEK GUIDE) w/Device KIT Use to check blood sugar 3 times daily. E11.9 1 kit 0   digoxin (LANOXIN) 0.125 MG tablet Take 1 tablet (125 mcg total) by mouth once daily. 30 tablet 1   doxycycline (VIBRA-TABS) 100 MG tablet Take 1 tablet (100 mg total) by mouth every 12 (twelve) hours. 180 tablet 3   famotidine (PEPCID) 20 MG tablet Take 1 tablet (20 mg total) by mouth 2 times daily. 60 tablet 1   finasteride (PROSCAR) 5 MG tablet Take 1 tablet (5 mg total) by mouth daily. 30 tablet 1   fluticasone (FLONASE) 50 MCG/ACT nasal spray Place 2 sprays into both nostrils daily. 16 g 2   furosemide (LASIX) 40 MG tablet Take 1/2 tablet (20 mg total) by mouth daily. 30 tablet 1   glucose blood (ACCU-CHEK GUIDE) test strip Use to check blood sugar 3 times daily. 100 each 6   HYDROcodone-acetaminophen (NORCO) 10-325 MG tablet Take 1 tablet by mouth every 4 (four) hours as needed for pain. 30 tablet 0   Insulin Pen  Needle (PEN NEEDLES) 32G X 4 MM MISC Use to inject insulin once daily. 100 each 6   lidocaine (LIDODERM) 5 % Place 1 patch onto the skin daily. Remove & Discard patch within 12 hours or as directed by MD 30 patch 0   methocarbamol (ROBAXIN) 750 MG tablet Take 1 tablet (750 mg total) by mouth 3 (three) times daily as needed for muscle spasms 90 tablet 2   naloxone (NARCAN) nasal spray 4 mg/0.1 mL Opiates overdose 2 each 0   polyethylene glycol powder (GLYCOLAX/MIRALAX) 17 GM/SCOOP powder  Take 17 g by mouth daily. 510 g 3   rosuvastatin (CRESTOR) 20 MG tablet Take 1 tablet (20 mg total) by mouth daily. 90 tablet 1   sacubitril-valsartan (ENTRESTO) 49-51 MG Take 1 tablet by mouth 2 (two) times daily. 60 tablet 1   carvedilol (COREG) 3.125 MG tablet Take 1 tablet (3.125 mg total) by mouth 2 (two) times daily. 60 tablet 1   ferrous sulfate 325 (65 FE) MG tablet Take 1 tablet (325 mg total) by mouth every other day. 100 tablet 1   insulin glargine (LANTUS SOLOSTAR) 100 UNIT/ML Solostar Pen Inject 25 Units into the skin daily. 30 mL 6   magnesium oxide (MAG-OX) 400 (240 Mg) MG tablet Take 1 tablet (400 mg total) by mouth daily. 120 tablet 0   pregabalin (LYRICA) 75 MG capsule Take 1 capsule (75 mg total) by mouth at bedtime. 30 capsule 1   tamsulosin (FLOMAX) 0.4 MG CAPS capsule Take 1 capsule (0.4 mg total) by mouth daily after supper. 30 capsule 1   terbinafine (LAMISIL) 250 MG tablet Take 1 tablet (250 mg total) by mouth daily. 90 tablet 0   liver oil-zinc oxide (DESITIN) 40 % ointment Apply topically 2 (two) times daily. 56.7 g 0   senna-docusate (SENOKOT-S) 8.6-50 MG tablet Take 1 tablet by mouth 2 (two) times daily. 30 tablet 0   busPIRone (BUSPAR) 15 MG tablet Take 1 tablet (15 mg total) by mouth 2 (two) times daily. 60 tablet 1   DULoxetine (CYMBALTA) 20 MG capsule Take 1 capsule (20 mg total) by mouth daily. 30 capsule 1   pregabalin (LYRICA) 75 MG capsule Take 1 capsule (75 mg total) by mouth at  bedtime for 5 days. 5 capsule 0   No facility-administered medications prior to visit.     ROS Review of Systems  Constitutional:  Negative for activity change and appetite change.  HENT:  Negative for sinus pressure and sore throat.   Respiratory:  Negative for chest tightness, shortness of breath and wheezing.   Cardiovascular:  Negative for chest pain and palpitations.  Gastrointestinal:  Negative for abdominal distention, abdominal pain and constipation.  Genitourinary: Negative.   Musculoskeletal:        See HPI  Psychiatric/Behavioral:  Negative for behavioral problems and dysphoric mood.     Objective:  BP (!) 148/85   Pulse 60   Ht 6' (1.829 m)   Wt 169 lb 6.4 oz (76.8 kg)   SpO2 100%   BMI 22.97 kg/m      03/01/2023    2:58 PM 03/01/2023    2:29 PM 02/21/2023    1:00 AM  BP/Weight  Systolic BP 148 149 132  Diastolic BP 85 101 90  Wt. (Lbs)  169.4   BMI  22.97 kg/m2       Physical Exam Constitutional:      Appearance: He is well-developed.  Cardiovascular:     Rate and Rhythm: Normal rate.     Heart sounds: Normal heart sounds. No murmur heard. Pulmonary:     Effort: Pulmonary effort is normal.     Breath sounds: Normal breath sounds. No wheezing or rales.  Chest:     Chest wall: No tenderness.  Abdominal:     General: Bowel sounds are normal. There is no distension.     Palpations: Abdomen is soft. There is no mass.     Tenderness: There is no abdominal tenderness.  Musculoskeletal:        General: Normal range of  motion.     Thoracic back: Tenderness present.     Right lower leg: No edema.     Left lower leg: No edema.     Comments: Left ankle brace in place.  Skin:    Comments: Multiple nails of right hand are thick and dystrophic Single nail of left index finger with discoloration.  Neurological:     Mental Status: He is alert and oriented to person, place, and time.  Psychiatric:        Mood and Affect: Mood normal.        Latest  Ref Rng & Units 01/09/2023    8:41 AM 12/12/2022    4:24 PM 10/28/2022    2:37 AM  CMP  Glucose 70 - 99 mg/dL 161  096  045   BUN 8 - 27 mg/dL 21  22  25    Creatinine 0.76 - 1.27 mg/dL 4.09  8.11  9.14   Sodium 134 - 144 mmol/L 136  134  134   Potassium 3.5 - 5.2 mmol/L 5.3  4.2  4.6   Chloride 96 - 106 mmol/L 100  100  99   CO2 20 - 29 mmol/L 21  24  26    Calcium 8.6 - 10.2 mg/dL 78.2  9.8  9.7   Total Protein 6.0 - 8.5 g/dL 6.8  6.7    Total Bilirubin 0.0 - 1.2 mg/dL 1.4  1.4    Alkaline Phos 44 - 121 IU/L 116     AST 0 - 40 IU/L 16  13    ALT 0 - 44 IU/L 13  11      Lipid Panel     Component Value Date/Time   CHOL 199 01/09/2023 0841   TRIG 235 (H) 01/09/2023 0841   HDL 36 (L) 01/09/2023 0841   CHOLHDL 5.9 (H) 12/14/2021 1025   CHOLHDL 6.6 09/03/2021 0314   VLDL 50 (H) 09/03/2021 0314   LDLCALC 121 (H) 01/09/2023 0841    CBC    Component Value Date/Time   WBC 6.6 12/12/2022 1624   RBC 4.84 12/12/2022 1624   HGB 14.7 12/12/2022 1624   HGB 17.3 06/16/2021 1658   HCT 43.6 12/12/2022 1624   HCT 47.0 06/16/2021 1658   PLT 222 12/12/2022 1624   PLT 244 06/16/2021 1658   MCV 90.1 12/12/2022 1624   MCV 91 06/16/2021 1658   MCH 30.4 12/12/2022 1624   MCHC 33.7 12/12/2022 1624   RDW 14.4 12/12/2022 1624   RDW 12.7 06/16/2021 1658   LYMPHSABS 739 (L) 12/12/2022 1624   LYMPHSABS 1.7 11/16/2018 1128   MONOABS 0.9 10/22/2022 0204   EOSABS 152 12/12/2022 1624   EOSABS 0.4 11/16/2018 1128   BASOSABS 73 12/12/2022 1624   BASOSABS 0.1 11/16/2018 1128    Lab Results  Component Value Date   HGBA1C 7.1 (A) 03/01/2023    Assessment & Plan:      Fall with Rib and Ankle Injury Recent fall due to tripping over a pillow in the dark, resulting in a twisted ankle and rib pain. No fractures identified in the emergency room visit. -Continue current pain management plan with hydrocodone as prescribed by pain management clinic. -Case manager to contact pain management clinic to  discuss current situation and potential need for additional medication due to increased pain from fall.  Foot Drop Ongoing issue with foot drop, currently using a brace provided by the emergency room. -Write prescription for left drop foot brace with reel adjust dorsiflexion drop foot  support for walking with shoes. -Send prescription to [facility] via fax.  Chronic Back Pain secondary to osteomyelitis of the spine Ongoing chronic back pain, managed with Lyrica and physical therapy. -Continue current pain management plan with Lyrica and physical therapy. -Refill Lyrica prescription.  Type 2 Diabetes Mellitus Elevated A1c at 7.1, up from 5.5. Fasting blood sugars ranging from 130-150. -Increase Lantus from 25 units to 27 units daily. -If fasting blood sugars continue to be above 120, patient can increase Lantus by an additional 2 units. -Counseled on Diabetic diet, my plate method, 161 minutes of moderate intensity exercise/week Blood sugar logs with fasting goals of 80-120 mg/dl, random of less than 096 and in the event of sugars less than 60 mg/dl or greater than 045 mg/dl encouraged to notify the clinic. Advised on the need for annual eye exams, annual foot exams, Pneumonia vaccine.  Atrial fibrillation Currently in sinus rhythm Continue Eliquis Advised to follow-up with cardiology  Hyperkalemia Last set of labs revealed hypokalemia Will check potassium again   General Health Maintenance / Followup Plans -Follow up in 3 months. -Continue physical therapy and neuroclonic therapy. -Case manager to contact pain management clinic to discuss current situation and potential need for additional medication due to increased pain from fall.          Meds ordered this encounter  Medications   Misc. 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    Dispense:  30 capsule    Refill:  5   insulin glargine (LANTUS SOLOSTAR) 100 UNIT/ML Solostar Pen    Sig: Inject 27 Units into the skin daily.    Dispense:  30 mL    Refill:  6    per dr Blake Divine, ok to change to lantus   carvedilol (COREG) 3.125 MG tablet    Sig: Take 1 tablet (3.125 mg total) by mouth 2 (two) times daily.    Dispense:  180 tablet    Refill:  1   tamsulosin (FLOMAX) 0.4 MG CAPS capsule    Sig: Take 1 capsule (0.4 mg total) by mouth daily after supper.    Dispense:  90 capsule    Refill:  1   terbinafine (LAMISIL) 250 MG tablet    Sig: Take 1 tablet (250 mg total) by mouth daily.    Dispense:  90 tablet    Refill:  1    Follow-up: Return in about 3 months (around 06/01/2023) for Chronic medical conditions.       [name], MD, FAAFP. [facility] [place], [place] [phone]   03/01/2023, 5:10 PM

## 2023-03-03 ENCOUNTER — Other Ambulatory Visit: Payer: Self-pay | Admitting: Family Medicine

## 2023-03-03 ENCOUNTER — Other Ambulatory Visit (HOSPITAL_COMMUNITY): Payer: Self-pay

## 2023-03-03 ENCOUNTER — Other Ambulatory Visit: Payer: Self-pay

## 2023-03-03 LAB — HCV INTERPRETATION

## 2023-03-03 LAB — MICROALBUMIN / CREATININE URINE RATIO
Creatinine, Urine: 73.1 mg/dL
Microalb/Creat Ratio: 2919 mg/g{creat} — ABNORMAL HIGH (ref 0–29)
Microalbumin, Urine: 2134.1 ug/mL

## 2023-03-03 LAB — POTASSIUM: Potassium: 4.9 mmol/L (ref 3.5–5.2)

## 2023-03-03 LAB — HCV AB W REFLEX TO QUANT PCR: HCV Ab: NONREACTIVE

## 2023-03-06 ENCOUNTER — Other Ambulatory Visit: Payer: Self-pay

## 2023-03-06 ENCOUNTER — Other Ambulatory Visit: Payer: Self-pay | Admitting: Family Medicine

## 2023-03-06 ENCOUNTER — Other Ambulatory Visit (HOSPITAL_COMMUNITY): Payer: Self-pay

## 2023-03-06 ENCOUNTER — Telehealth: Payer: Self-pay

## 2023-03-06 DIAGNOSIS — R809 Proteinuria, unspecified: Secondary | ICD-10-CM

## 2023-03-06 MED ORDER — DAPAGLIFLOZIN PROPANEDIOL 10 MG PO TABS
10.0000 mg | ORAL_TABLET | Freq: Every day | ORAL | 1 refills | Status: DC
  Filled 2023-03-06 – 2023-04-04 (×3): qty 90, 90d supply, fill #0
  Filled 2023-09-23: qty 90, 90d supply, fill #1

## 2023-03-06 MED ORDER — MISC. DEVICES MISC: 0 refills | Status: DC

## 2023-03-06 NOTE — Telephone Encounter (Signed)
Pharmacy Patient Advocate Encounter   Received notification from Patient Pharmacy that prior authorization for [facility] is required/requested.   Insurance verification completed.   The patient is insured through [facility] .   Per test claim: PA required; PA submitted to above mentioned insurance via CoverMyMeds Key/confirmation #/EOC [id number] Status is pending

## 2023-03-07 ENCOUNTER — Telehealth: Payer: Self-pay

## 2023-03-07 DIAGNOSIS — M546 Pain in thoracic spine: Secondary | ICD-10-CM

## 2023-03-07 NOTE — Telephone Encounter (Signed)
Copied from CRM [id number]. Topic: General - Other >> Mar 07, 2023  9:49 AM Phill Myron wrote: Pt. Brinkworth stated that this medication was not discussed at his last visit why was he given this medication: dapagliflozin propanediol (FARXIGA) 10 MG TABS tablet

## 2023-03-07 NOTE — Telephone Encounter (Signed)
Copied from CRM [id number]. Topic: Referral - Request for Referral >> Mar 07, 2023  9:51 AM Phill Myron wrote: Referral for therapy needed, please advise

## 2023-03-08 ENCOUNTER — Other Ambulatory Visit: Payer: Self-pay

## 2023-03-08 NOTE — Addendum Note (Signed)
Addended by: [name] on: 03/08/2023 12:56 PM   Modules accepted: Orders

## 2023-03-08 NOTE — Telephone Encounter (Signed)
The medication David [name] was not discussed at his last visit however I sent him a MyChart message after his last labs prior to initiation.  Referral for PT has been placed.

## 2023-03-08 NOTE — Telephone Encounter (Signed)
Pt is needing new referral to PT

## 2023-03-09 NOTE — Telephone Encounter (Signed)
Pt is requesting Dexcom meter sent to his pharmacy

## 2023-03-10 ENCOUNTER — Other Ambulatory Visit (HOSPITAL_COMMUNITY): Payer: Self-pay

## 2023-03-10 MED ORDER — DEXCOM G7 SENSOR MISC
1.0000 | 12 refills | Status: AC
  Filled 2023-03-10 – 2023-03-13 (×2): qty 3, 30d supply, fill #0

## 2023-03-10 MED ORDER — DEXCOM G7 RECEIVER DEVI
1.0000 | 0 refills | Status: DC
  Filled 2023-03-10: qty 1, 1d supply, fill #0
  Filled 2023-03-13: qty 1, 365d supply, fill #0
  Filled 2023-03-13: qty 1, 1d supply, fill #0

## 2023-03-10 NOTE — Telephone Encounter (Signed)
Done

## 2023-03-10 NOTE — Addendum Note (Signed)
Addended by: [name] on: 03/10/2023 05:18 PM   Modules accepted: Orders

## 2023-03-12 ENCOUNTER — Other Ambulatory Visit: Payer: Self-pay

## 2023-03-12 ENCOUNTER — Emergency Department (HOSPITAL_COMMUNITY)
Admission: EM | Admit: 2023-03-12 | Discharge: 2023-03-12 | Disposition: A | Discharge status: Home / Self Care | Payer: Medicaid Other | Attending: Emergency Medicine | Admitting: Emergency Medicine

## 2023-03-12 DIAGNOSIS — M545 Low back pain, unspecified: Secondary | ICD-10-CM | POA: Insufficient documentation

## 2023-03-12 DIAGNOSIS — M25579 Pain in unspecified ankle and joints of unspecified foot: Secondary | ICD-10-CM | POA: Insufficient documentation

## 2023-03-12 DIAGNOSIS — R0781 Pleurodynia: Secondary | ICD-10-CM | POA: Insufficient documentation

## 2023-03-12 DIAGNOSIS — E119 Type 2 diabetes mellitus without complications: Secondary | ICD-10-CM | POA: Diagnosis not present

## 2023-03-12 DIAGNOSIS — I509 Heart failure, unspecified: Secondary | ICD-10-CM | POA: Insufficient documentation

## 2023-03-12 DIAGNOSIS — I110 Hypertensive heart disease with heart failure: Secondary | ICD-10-CM | POA: Diagnosis not present

## 2023-03-12 DIAGNOSIS — G8929 Other chronic pain: Secondary | ICD-10-CM | POA: Diagnosis not present

## 2023-03-12 MED ORDER — ONDANSETRON 4 MG PO TBDP: 4.0000 mg | ORAL_TABLET | Freq: Three times a day (TID) | ORAL | 0 refills | Status: DC | PRN

## 2023-03-12 MED ORDER — METHOCARBAMOL 500 MG PO TABS
1000.0000 mg | ORAL_TABLET | Freq: Once | ORAL | Status: AC
  Administered 2023-03-12: 1000 mg via ORAL
  Filled 2023-03-12: qty 2

## 2023-03-12 MED ORDER — KETOROLAC TROMETHAMINE 30 MG/ML IJ SOLN: 30.0000 mg | Freq: Once | INTRAMUSCULAR | Status: DC

## 2023-03-12 MED ORDER — METHOCARBAMOL 500 MG PO TABS: 500.0000 mg | ORAL_TABLET | Freq: Three times a day (TID) | ORAL | 0 refills | Status: DC | PRN

## 2023-03-12 NOTE — Discharge Instructions (Signed)
Please continue to follow with your pain management doctor.  We are unable to give additional opiate medications from the emergency department.  I have called in a muscle relaxing medicine and nausea medications.  He may also take Tylenol for pain.

## 2023-03-12 NOTE — ED Provider Notes (Signed)
Emergency Department Provider Note   I have reviewed the triage vital signs and the nursing notes.   HISTORY  Chief Complaint Back Pain and Medication Refill   HPI David [name] is a 62 y.o. male with past history reviewed below including chronic pain, followed at [facility] medical for pain management presents to the emergency department with pain in multiple areas including his low back, ribs, ankle.  He has had these areas imaged extensively in the past.  No new falls or injury.  He tells me that he did take more than the prescribed dose of his hydrocodone due to increased pain after a mechanical fall several weeks ago.  As result, he tells me he is run out of his pain medicines.  He saw his pain management doctor on Friday who refused to refill his prescription early.  He presents today for further evaluation.   Past Medical History:  Diagnosis Date   Cardiomyopathy (HCC)    a. EF 45% in 2019.   CHF (congestive heart failure) (HCC)    Chronic anticoagulation 09/30/2017   Diabetes mellitus type 2 in nonobese [facility])    Discitis of thoracic region 09/25/2022   Does not have health insurance    Essential hypertension 09/26/2017   Financial difficulties    H/O noncompliance with medical treatment, presenting hazards to health    Hardware complicating wound infection (HCC) 09/25/2022   Non-insulin treated type 2 diabetes mellitus (HCC) 09/26/2017   Persistent atrial fibrillation (HCC)    Sleep apnea suspected 09/30/2017    Review of Systems  Constitutional: No fever/chills Cardiovascular: Denies chest pain. Respiratory: Denies shortness of breath. Gastrointestinal: No abdominal pain.  Genitourinary: Negative for dysuria. Musculoskeletal: Positive back, rib, and ankle pain.  Skin: Negative for rash. Neurological: Negative for headaches.  ____________________________________________   PHYSICAL EXAM:  VITAL SIGNS: ED Triage Vitals  Encounter Vitals Group     BP 03/12/23  0741 (!) 160/124     Pulse Rate 03/12/23 0741 (!) 101     Resp 03/12/23 0741 18     Temp 03/12/23 0741 97.8 F (36.6 C)     Temp Source 03/12/23 0741 Oral     SpO2 03/12/23 0741 97 %     Weight 03/12/23 0741 175 lb (79.4 kg)     Height 03/12/23 0741 6' (1.829 m)   Constitutional: Alert and oriented. Well appearing and in no acute distress. Eyes: Conjunctivae are normal.  Head: Atraumatic. Nose: No congestion/rhinnorhea. Mouth/Throat: Mucous membranes are moist. Neck: No stridor.  Cardiovascular: Good peripheral circulation. Respiratory: Normal respiratory effort. Gastrointestinal: No distention.  Musculoskeletal: No lower extremity tenderness nor edema. No gross deformities of extremities. Neurologic:  Normal speech and language. Skin:  Skin is warm, dry and intact. No rash noted.   ____________________________________________   PROCEDURES  Procedure(s) performed:   Procedures  None  ____________________________________________   INITIAL IMPRESSION / ASSESSMENT AND PLAN / ED COURSE  Pertinent labs & imaging results that were available during my care of the patient were reviewed by me and considered in my medical decision making (see chart for details).   This patient is Presenting for Evaluation of back pain, which does require a range of treatment options, and is a complaint that involves a high risk of morbidity and mortality.  The Differential Diagnoses includes but is not exclusive to musculoskeletal back pain, renal colic, urinary tract infection, pyelonephritis, intra-abdominal causes of back pain, aortic aneurysm or dissection, cauda equina syndrome, sciatica, lumbar disc disease, thoracic disc  disease, etc.   Critical Interventions-    Medications  methocarbamol (ROBAXIN) tablet 1,000 mg (has no administration in time range)   Medical Decision Making: Summary:  Patient presents to the emergency department after running out of his pain medications early.  He  is followed by pain management at [facility].  His symptoms are chronic.  No new injuries.  No red flag signs or symptoms to prompt additional/emergent spine imaging today.  I offered him nonopiate pain management options.  Patient understands that the emergency department cannot extend his hydrocodone prescription for chronic pain especially when his pain management team is unwilling to do so.  I did send a prescription for Robaxin and Zofran to his pharmacy.  Patient's presentation is most consistent with exacerbation of chronic illness.   Disposition: discharge  ____________________________________________  FINAL CLINICAL IMPRESSION(S) / ED DIAGNOSES  Final diagnoses:  Chronic midline low back pain without sciatica     NEW OUTPATIENT MEDICATIONS STARTED DURING THIS VISIT:  New Prescriptions   METHOCARBAMOL (ROBAXIN) 500 MG TABLET    Take 1 tablet (500 mg total) by mouth every 8 (eight) hours as needed.   ONDANSETRON (ZOFRAN-ODT) 4 MG DISINTEGRATING TABLET    Take 1 tablet (4 mg total) by mouth every 8 (eight) hours as needed.    Note:  This document was prepared using Dragon voice recognition software and may include unintentional dictation errors.  [name], MD, [facility] Emergency Medicine    Tiffeny Minchew, Arlyss Repress, MD 03/12/23 [phone]

## 2023-03-12 NOTE — ED Triage Notes (Signed)
Pt has run out of their pain medication for their back. Their pain clinic doctor won't give him a refill till the 17th.

## 2023-03-13 ENCOUNTER — Other Ambulatory Visit (HOSPITAL_COMMUNITY): Payer: Self-pay

## 2023-03-13 ENCOUNTER — Telehealth: Payer: Self-pay

## 2023-03-13 ENCOUNTER — Other Ambulatory Visit: Payer: Self-pay

## 2023-03-13 NOTE — Telephone Encounter (Signed)
Pharmacy Patient Advocate Encounter   Received notification from CoverMyMeds that prior authorization for [facility] READER AND SENSOR is required/requested.   Insurance verification completed.   The patient is insured through [facility] .   Per test claim: PA required; PA submitted to above mentioned insurance via CoverMyMeds Key/confirmation #/EOC [id number] Status is pending

## 2023-03-15 NOTE — Telephone Encounter (Signed)
APPROVED UNTIL 03/05/2024

## 2023-03-15 NOTE — Telephone Encounter (Signed)
APPROVED UNTIL 09/11/2023

## 2023-03-16 ENCOUNTER — Other Ambulatory Visit (HOSPITAL_COMMUNITY): Payer: Self-pay

## 2023-03-17 ENCOUNTER — Ambulatory Visit: Payer: Medicaid Other | Attending: Surgery | Admitting: Physical Therapy

## 2023-03-17 DIAGNOSIS — R2681 Unsteadiness on feet: Secondary | ICD-10-CM | POA: Insufficient documentation

## 2023-03-17 DIAGNOSIS — R293 Abnormal posture: Secondary | ICD-10-CM | POA: Insufficient documentation

## 2023-03-17 DIAGNOSIS — R262 Difficulty in walking, not elsewhere classified: Secondary | ICD-10-CM | POA: Insufficient documentation

## 2023-03-17 DIAGNOSIS — M6281 Muscle weakness (generalized): Secondary | ICD-10-CM | POA: Insufficient documentation

## 2023-03-17 NOTE — Therapy (Incomplete)
OUTPATIENT PHYSICAL THERAPY THORACOLUMBAR EVALUATION   Patient Name: David [name] MRN: [id number] DOB:26-Jul-1960, 62 y.o., male Today's Date: 03/17/2023  END OF SESSION:   Past Medical History:  Diagnosis Date   Cardiomyopathy (HCC)    a. EF 45% in 2019.   CHF (congestive heart failure) (HCC)    Chronic anticoagulation 09/30/2017   Diabetes mellitus type 2 in nonobese [facility])    Discitis of thoracic region 09/25/2022   Does not have health insurance    Essential hypertension 09/26/2017   Financial difficulties    H/O noncompliance with medical treatment, presenting hazards to health    Hardware complicating wound infection (HCC) 09/25/2022   Non-insulin treated type 2 diabetes mellitus (HCC) 09/26/2017   Persistent atrial fibrillation (HCC)    Sleep apnea suspected 09/30/2017   Past Surgical History:  Procedure Laterality Date   ABDOMINAL AORTOGRAM W/LOWER EXTREMITY N/A 09/02/2021   Procedure: ABDOMINAL AORTOGRAM W/LOWER EXTREMITY;  Surgeon: Nada Libman, MD;  Location: [facility]VAS[facility] LAB;  Service: Cardiovascular;  Laterality: N/A;   ABDOMINAL AORTOGRAM W/LOWER EXTREMITY N/A 12/28/2021   Procedure: ABDOMINAL AORTOGRAM W/LOWER EXTREMITY;  Surgeon: Nada Libman, MD;  Location: [facility];  Service: Cardiovascular;  Laterality: N/A;   ABDOMINAL AORTOGRAM W/LOWER EXTREMITY N/A 07/28/2022   Procedure: ABDOMINAL AORTOGRAM W/LOWER EXTREMITY;  Surgeon: Cephus Shelling, MD;  Location: [facility];  Service: Cardiovascular;  Laterality: N/A;   AMPUTATION Right 09/04/2021   Procedure: 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;  Surgeon: Nadara Mustard, MD;  [facility] OR;  Service: Orthopedics;  Laterality: Right;   AMPUTATION Right 08/02/2022   Procedure: AMPUTATION RAY 5TH METATARSAL OF RIGHT FOOT;  Surgeon: [name], MD;  Location: [facility] OR;  Service: Orthopedics;  Laterality: Right;   APPENDECTOMY  1971   BONE BIOPSY Right 08/02/2022   Procedure: BONE BIOPSY OF 5TH  METATARSAL RIGHT FOOT;  Surgeon: [name], MD;  Location: [facility] OR;  Service: Orthopedics;  Laterality: Right;   CARDIOVERSION N/A 09/28/2017   Procedure: CARDIOVERSION;  Surgeon: Thurmon Fair, MD;  Location: [facility] ENDOSCOPY;  Service: Cardiovascular;  Laterality: N/A;   IRRIGATION AND DEBRIDEMENT FOOT  08/02/2022   Procedure: IRRIGATION AND DEBRIDEMENT RIGHT 5TH METATARSAL FOOT;  Surgeon: [name], MD;  Location: [facility] OR;  Service: Orthopedics;;   LAMINECTOMY WITH POSTERIOR LATERAL ARTHRODESIS LEVEL 4 N/A 08/30/2022   Procedure: THORACIC FIVE-THORACIC ELEVEN FUSION, THORACIC EIGHT TRANSPEDICULAR DECOMPRESSION AND PARTIAL CORPECTOMY with O-Arm;  Surgeon: Bedelia Person, MD;  Location: [facility] OR;  Service: Neurosurgery;  Laterality: N/A;   PERIPHERAL VASCULAR BALLOON ANGIOPLASTY  09/02/2021   Procedure: PERIPHERAL VASCULAR BALLOON ANGIOPLASTY;  Surgeon: Nada Libman, MD;  Location: [facility];  Service: Cardiovascular;;   PERIPHERAL VASCULAR BALLOON ANGIOPLASTY  12/28/2021   Procedure: PERIPHERAL VASCULAR BALLOON ANGIOPLASTY;  Surgeon: Nada Libman, MD;  [facility]: [facility];  Service: Cardiovascular;;  Posterior Tibial PTA only   TEE WITHOUT CARDIOVERSION N/A 09/28/2017   Procedure: TRANSESOPHAGEAL ECHOCARDIOGRAM (TEE);  Surgeon: Thurmon Fair, MD;  Location: [facility] ENDOSCOPY;  Service: Cardiovascular;  Laterality: N/A;   TEE WITHOUT CARDIOVERSION N/A 09/03/2021   Procedure: TRANSESOPHAGEAL ECHOCARDIOGRAM (TEE);  Surgeon: Chilton Si, MD;  Location: [facility] ENDOSCOPY;  Service: Cardiovascular;  Laterality: N/A;   TEE WITHOUT CARDIOVERSION N/A 07/15/2022   Procedure: TRANSESOPHAGEAL ECHOCARDIOGRAM;  Surgeon: Thurmon Fair, MD;  [facility]: [facility];  Service: Cardiovascular;  Laterality: N/A;   Patient Active Problem List   Diagnosis Date Noted  Hypoglycemia 10/06/2022   Pleural effusion 09/29/2022   Chronic systolic CHF (congestive heart failure) (HCC)  09/29/2022   Acute urinary retention 09/29/2022   Anxiety 09/29/2022   Hardware complicating wound infection (HCC) 09/25/2022   Discitis of thoracic region 09/25/2022   Hypokalemia 09/07/2022   Acute toxic encephalopathy 09/07/2022   Acute postoperative anemia due to expected blood loss 09/07/2022   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   Adjustment disorder with mixed anxiety and depressed mood 08/13/2022   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 08/08/2022   MRSA bacteremia 08/08/2022   AKI (acute kidney injury), ruled out 07/27/2022   Discitis thoracic region 07/27/2022   Bacteremia due to methicillin resistant Staphylococcus aureus 07/27/2022   Severe protein-calorie malnutrition (HCC) 07/26/2022   PVD (peripheral vascular disease) (HCC) 07/26/2022   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   Bacteremia 07/15/2022   Acute respiratory failure with hypoxia (HCC) 07/12/2022   Atelectasis 07/12/2022   Sepsis due to pneumonia (HCC) 07/12/2022   Gangrene of toe of right foot (HCC)    Sepsis (HCC) 08/29/2021   SIRS (systemic inflammatory response syndrome) (HCC) 08/29/2021   Hyponatremia 08/29/2021   Hypomagnesemia 08/29/2021   Pure hypercholesterolemia 06/17/2021   Pain of left hand 09/06/2019   Cellulitis 08/26/2019   Persistent atrial fibrillation (HCC) 08/26/2019   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) 10/18/2018   Transaminitis 10/17/2018   Hyperbilirubinemia 10/17/2018   Leukocytosis 10/17/2018   Sinusitis 10/17/2018   Chronic anticoagulation 09/30/2017   Smoker 09/30/2017   Cardiomyopathy (HCC) 09/30/2017   Sleep apnea suspected 09/30/2017   Essential hypertension 09/26/2017   Uncontrolled type 2 diabetes with hyperglycemia and hypoglycemia, insulin long term use 09/26/2017   Atrial fibrillation with RVR (HCC) 09/26/2017    PCP: [name] MD  REFERRING PROVIDER: Patrici Ranks Caylin PA-C  REFERRING DIAG: [phone] fusion of thoracic  spine  Rationale for Evaluation and Treatment: Rehabilitation  THERAPY DIAG:  Back pain; weakness ONSET DATE: ***  SUBJECTIVE:                                                                                                                                                                                           SUBJECTIVE STATEMENT: Had issue following right toe amputations and ended up with MRSA infection which infected lungs and subsequently thoracic spine resulting in surgical fixation to thoracic spine.   Has home T[facility] PT 12 visits last visit 02/09/2023  PERTINENT HISTORY:  08/30/22 T5-11 fusion, T8 decompression with partial corpectomy; lami with post-lateral arthrodesis L4 Cardiomyopathy, A-fib, CHF, DM, HTN  PAIN:   Are you having pain? Yes NPRS scale: ***/10 Pain location: ***  Pain orientation: {Pain Orientation:25161}  PAIN TYPE: {type:313116} Pain description: {PAIN DESCRIPTION:21022940}  Aggravating factors: *** Relieving factors: ***  PRECAUTIONS: None    WEIGHT BEARING RESTRICTIONS: No  FALLS:  Has patient fallen in last 6 months? Yes  LIVING ENVIRONMENT: Lives with: lives with their spouse Lives in: House/apartment, pt reports housing issues since out of work Stairs: No Has following equipment at home: Environmental consultant - 2 wheeled    OCCUPATION: previously worked as Photographer   PLOF: {PLOF:24004}  PATIENT GOALS: ***  NEXT MD VISIT: ***  OBJECTIVE:  Note: Objective measures were completed at Evaluation unless otherwise noted.  DIAGNOSTIC FINDINGS:  ***  PATIENT SURVEYS:  Modified Oswestry ***   COGNITION: Overall cognitive status: Within functional limits for tasks assessed     SENSATION: {sensation:27233}  MUSCLE LENGTH: Hamstrings: Right *** deg; Left *** deg Maisie Fus test: Right *** deg; Left *** deg  POSTURE: {posture:25561}  PALPATION: ***  LUMBAR ROM:   AROM eval  Flexion   Extension   Right lateral  flexion   Left lateral flexion   Right rotation   Left rotation    (Blank rows = not tested)  LOWER EXTREMITY ROM:     Active  Right eval Left eval  Hip flexion    Hip extension    Hip abduction    Hip adduction    Hip internal rotation    Hip external rotation    Knee flexion    Knee extension    Ankle dorsiflexion    Ankle plantarflexion    Ankle inversion    Ankle eversion     (Blank rows = not tested)  LOWER EXTREMITY MMT:    MMT Right eval Left eval  Hip flexion    Hip extension    Hip abduction    Hip adduction    Hip internal rotation    Hip external rotation    Knee flexion    Knee extension    Ankle dorsiflexion    Ankle plantarflexion    Ankle inversion    Ankle eversion     (Blank rows = not tested)  LUMBAR SPECIAL TESTS:  {lumbar special test:25242}  FUNCTIONAL TESTS:    GAIT: Distance walked: *** Assistive device utilized: {Assistive devices:23999} Level of assistance: {Levels of assistance:24026} Comments: ***  TODAY'S TREATMENT:                                                                                                                              DATE: 12/13    PATIENT EDUCATION:  Education details: Educated patient on anatomy and physiology of current symptoms, prognosis, plan of care as well as initial self care strategies to promote recovery Person educated: Patient Education method: Explanation Education comprehension: verbalized understanding  HOME EXERCISE PROGRAM: ***  ASSESSMENT:  CLINICAL IMPRESSION: Patient is a 62 y.o. male who was seen today for physical therapy evaluation and treatment for ***.   OBJECTIVE IMPAIRMENTS: {opptimpairments:25111}.  ACTIVITY LIMITATIONS: {activitylimitations:27494}  PARTICIPATION LIMITATIONS: {participationrestrictions:25113}  PERSONAL FACTORS: {Personal factors:25162} are also affecting patient's functional outcome.   REHAB POTENTIAL: {rehabpotential:25112}  CLINICAL  DECISION MAKING: {clinical decision making:25114}  EVALUATION COMPLEXITY: {Evaluation complexity:25115}   GOALS: Goals reviewed with patient? Yes  SHORT TERM GOALS: Target date: 04/14/2023    The patient will demonstrate knowledge of basic self care strategies and exercises to promote healing  Baseline: Goal status: INITIAL  2.  *** Baseline:  Goal status: INITIAL  3.  *** Baseline:  Goal status: INITIAL  4.  *** Baseline:  Goal status: INITIAL  5.  *** Baseline:  Goal status: INITIAL   LONG TERM GOALS: Target date: 05/12/2023    The patient will be independent in a safe self progression of a home exercise program to promote further recovery of function   Baseline:  Goal status: INITIAL  2.  *** Baseline:  Goal status: INITIAL  3.  *** Baseline:  Goal status: INITIAL  4.  *** Baseline:  Goal status: INITIAL  5.  *** Baseline:  Goal status: INITIAL  6.  *** Baseline:  Goal status: INITIAL  PLAN:  PT FREQUENCY: 2x/week  PT DURATION: 8 weeks  PLANNED INTERVENTIONS: 97164- PT Re-evaluation, 97110-Therapeutic exercises, 97530- Therapeutic activity, 97112- Neuromuscular re-education, 97535- Self Care, 96045- Manual therapy, [id number]- Aquatic Therapy, 97014- Electrical stimulation (unattended), [id number]- Electrical stimulation (manual), Taping, Dry Needling, Spinal mobilization, Cryotherapy, and Moist heat.  PLAN FOR NEXT SESSION: ***

## 2023-03-20 ENCOUNTER — Other Ambulatory Visit: Payer: Self-pay

## 2023-03-20 ENCOUNTER — Ambulatory Visit (INDEPENDENT_AMBULATORY_CARE_PROVIDER_SITE_OTHER): Payer: Medicaid Other | Admitting: Internal Medicine

## 2023-03-20 ENCOUNTER — Other Ambulatory Visit (HOSPITAL_COMMUNITY): Payer: Self-pay

## 2023-03-20 VITALS — BP 127/82 | HR 74 | Temp 97.0°F | Ht 72.0 in | Wt 170.0 lb

## 2023-03-20 DIAGNOSIS — R300 Dysuria: Secondary | ICD-10-CM | POA: Diagnosis present

## 2023-03-20 MED ORDER — DOXYCYCLINE HYCLATE 100 MG PO TABS
100.0000 mg | ORAL_TABLET | Freq: Two times a day (BID) | ORAL | 3 refills | Status: DC
  Filled 2023-03-20: qty 180, 90d supply, fill #0
  Filled 2023-04-02: qty 60, 30d supply, fill #0
  Filled 2023-06-29: qty 60, 30d supply, fill #1
  Filled 2023-09-23: qty 60, 30d supply, fill #2
  Filled 2023-10-24: qty 60, 30d supply, fill #3
  Filled 2023-11-07 – 2023-12-07 (×2): qty 60, 30d supply, fill #4
  Filled 2023-12-17 – 2023-12-29 (×2): qty 60, 30d supply, fill #5
  Filled 2024-01-08: qty 60, 30d supply, fill #0
  Filled 2024-02-07: qty 60, 30d supply, fill #1
  Filled 2024-03-06: qty 60, 30d supply, fill #2

## 2023-03-20 MED ORDER — SULFAMETHOXAZOLE-TRIMETHOPRIM 800-160 MG PO TABS
1.0000 | ORAL_TABLET | Freq: Two times a day (BID) | ORAL | 0 refills | Status: AC
  Filled 2023-03-20: qty 14, 7d supply, fill #0

## 2023-03-20 NOTE — Patient Instructions (Signed)
-  URINARY SYMPTOMS: You are experiencing painful and uncomfortable urination, especially at the start. This could be a urinary tract infection (UTI), which is an infection in any part of your urinary system. We have ordered a urinalysis and urine culture to confirm this and started you on Bactrim to treat a possible UTI.  -FOOT WOUND: You have a recent cut on your big toe from stepping on glass. Although there are no signs of infection, we need to be cautious due to your history of MRSA and diabetes. We redressed the wound and you should continue taking doxycycline to prevent infection.  Please follow up in 6 months. If you develop a fever or chills, go to the emergency department immediately.

## 2023-03-20 NOTE — Progress Notes (Unsigned)
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 08/08/2022   MRSA bacteremia 08/08/2022   AKI (acute kidney injury), ruled out 07/27/2022   Discitis thoracic region 07/27/2022   Bacteremia due to methicillin resistant Staphylococcus aureus 07/27/2022   Severe protein-calorie malnutrition (HCC) 07/26/2022   PVD (peripheral vascular disease) (HCC) 07/26/2022   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   Bacteremia 07/15/2022   Acute respiratory failure with hypoxia (HCC) 07/12/2022   Atelectasis 07/12/2022   Sepsis due to pneumonia (HCC) 07/12/2022   Gangrene of toe of right foot (HCC)    Sepsis (HCC) 08/29/2021   SIRS (systemic inflammatory response syndrome) (HCC) 08/29/2021   Hyponatremia 08/29/2021   Hypomagnesemia 08/29/2021   Pure hypercholesterolemia 06/17/2021   Pain of left hand 09/06/2019   Cellulitis 08/26/2019   Persistent atrial fibrillation (HCC) 08/26/2019   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) 10/18/2018   Transaminitis 10/17/2018   Hyperbilirubinemia 10/17/2018   Leukocytosis 10/17/2018   Sinusitis 10/17/2018   Chronic anticoagulation 09/30/2017   Smoker 09/30/2017   Cardiomyopathy (HCC) 09/30/2017   Sleep apnea suspected 09/30/2017   Essential hypertension 09/26/2017   Uncontrolled type 2 diabetes with hyperglycemia and hypoglycemia, insulin long term use 09/26/2017    Atrial fibrillation with RVR (HCC) 09/26/2017    Patient's Medications  New Prescriptions   SULFAMETHOXAZOLE-TRIMETHOPRIM (BACTRIM DS) 800-160 MG TABLET    Take 1 tablet by mouth 2 (two) times daily for 7 days.  Previous Medications   ACCU-CHEK SOFTCLIX LANCETS LANCETS    Use to check blood sugar 3 times daily.   APIXABAN (ELIQUIS) 5 MG TABS TABLET    Take 1 tablet (5 mg total) by mouth 2 (two) times daily.   BISACODYL (DULCOLAX) 10 MG SUPPOSITORY    Place 1 suppository (10 mg total) rectally daily as needed for moderate constipation.   BLOOD GLUCOSE MONITORING SUPPL (ACCU-CHEK GUIDE) W/DEVICE KIT    Use to check blood sugar 3 times daily. E11.9   CARVEDILOL (COREG) 3.125 MG TABLET    Take 1 tablet (3.125 mg total) by mouth 2 (two) times daily.   CONTINUOUS GLUCOSE RECEIVER (DEXCOM G7 RECEIVER) DEVI    Use as directed.   CONTINUOUS GLUCOSE SENSOR (DEXCOM G7 SENSOR) MISC    Use as directed & change every 10 days.   DAPAGLIFLOZIN PROPANEDIOL (FARXIGA) 10 MG TABS TABLET    Take 1 tablet (10 mg total) by mouth daily before breakfast.   DIGOXIN (LANOXIN) 0.125 MG TABLET    Take 1 tablet (125 mcg total) by mouth once daily.   FAMOTIDINE (PEPCID) 20 MG TABLET    Take 1 tablet (20 mg total) by mouth 2 times daily.   FINASTERIDE (PROSCAR) 5 MG TABLET    Take 1 tablet (5 mg total) by mouth daily.   FLUTICASONE (FLONASE) 50 MCG/ACT NASAL SPRAY    Place 2 sprays into both nostrils daily.   FUROSEMIDE (LASIX) 40 MG TABLET  Take 1/2 tablet (20 mg total) by mouth daily.   GLUCOSE BLOOD (ACCU-CHEK GUIDE) TEST STRIP    Use to check blood sugar 3 times daily.   HYDROCODONE-ACETAMINOPHEN (NORCO) 10-325 MG TABLET    Take 1 tablet by mouth every 4 (four) hours as needed for pain.   INSULIN GLARGINE (LANTUS SOLOSTAR) 100 UNIT/ML SOLOSTAR PEN    Inject 27 Units into the skin daily.   INSULIN PEN NEEDLE (PEN NEEDLES) 32G X 4 MM MISC    Use to inject insulin once daily.   LIDOCAINE (LIDODERM) 5 %    Place 1  patch onto the skin daily. Remove & Discard patch within 12 hours or as directed by MD   LIVER OIL-ZINC OXIDE (DESITIN) 40 % OINTMENT    Apply topically 2 (two) times daily.   METHOCARBAMOL (ROBAXIN) 500 MG TABLET    Take 1 tablet (500 mg total) by mouth every 8 (eight) hours as needed.   MISC. 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   POLYETHYLENE GLYCOL POWDER (GLYCOLAX/MIRALAX) 17 GM/SCOOP POWDER    Take 17 g by mouth daily.   PREGABALIN (LYRICA) 75 MG CAPSULE    Take 1 capsule (75 mg total) by mouth at bedtime.   ROSUVASTATIN (CRESTOR) 20 MG TABLET    Take 1 tablet (20 mg total) by mouth daily.   SACUBITRIL-VALSARTAN (ENTRESTO) 49-51 MG    Take 1 tablet by mouth 2 (two) times daily.   SENNA-DOCUSATE (SENOKOT-S) 8.6-50 MG TABLET    Take 1 tablet by mouth 2 (two) times daily.   TAMSULOSIN (FLOMAX) 0.4 MG CAPS CAPSULE    Take 1 capsule (0.4 mg total) by mouth daily after supper.   TERBINAFINE (LAMISIL) 250 MG TABLET    Take 1 tablet (250 mg total) by mouth daily.  Modified Medications   Modified Medication Previous Medication   DOXYCYCLINE (VIBRA-TABS) 100 MG TABLET doxycycline (VIBRA-TABS) 100 MG tablet      Take 1 tablet (100 mg total) by mouth every 12 (twelve) hours.    Take 1 tablet (100 mg total) by mouth every 12 (twelve) hours.  Discontinued Medications   No medications on file    Subjective: 52 YM male with past medical history as below presents for hospital follow-up.  He was admitted to [facility] 5/29 - 6/5 with right fourth toe osteomyelitis.  He had a recent spider bite and fifth  digit had worsened with more pain and less movement.  He was started on vancomycin, metronidazole and cefepime found to have MRSA bacteremia during hospitalization.  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  Underwent recanalization of occluded posttibial artery and balloon angioplasty on 6/1.  He underwent amputation of fourth ray foot on 6/3 with Dr. [name], no cultures obtained.  Discharged ceftriaxone, daptomycin and metronidazole x6 weeks EOT 10/15/2021.  09/21/2021: Original bandage on from discharge. Reports more pain at amputation side. Denies fever and chills.   Interim seen by ortho today, noted dehiscence of incision, with scant drainage.  7/21: Pt reports he has yellowish drainage from wound. PICC line out. Foot pain continues. Denies fevers and chills.   Interim: Pt was started on doxy at last visit due concern of worsening infection. MRI ordered which showed  marrow edema of residual fourth toe  11/12/21: Drainage has improved. Denies fever and and chills. Pt has returned to work. Interim: hospitalized for T spine OM 9/9: No new complaints.   Denies fevers, chills  Today 12/16 : Discussed the use of AI scribe software for clinical note transcription with the patient, who gave verbal consent to proceed.  ]He reports a new onset of urinary discomfort, described as 'painful' and 'uncomfortable,' particularly at the initiation of urination. He denies any changes in urinary frequency, attributing frequent urination to his Lasix medication. He has not previously experienced urinary issues and has not seen a urologist.  In addition, he sustained a cut to his big toe after stepping on a piece of glass. He requests a dressing change and application of Neosporin. He expresses concern about the wound due to his history of MRSA and diabetes.  The patient also mentions ongoing pain in his legs and feet, which he attributes to nerve damage.  He reports that the pain is constant and not fully relieved by Aleve. He is currently undergoing physical therapy and uses a cane for stability when outside the home.        Review of Systems: Review of Systems  All other systems reviewed and are negative.   Past Medical History:  Diagnosis Date   Cardiomyopathy (HCC)    a. EF 45% in 2019.   CHF (congestive heart failure) (HCC)    Chronic anticoagulation 09/30/2017   Diabetes mellitus type 2 in nonobese [facility])    Discitis of thoracic region 09/25/2022   Does not have health insurance    Essential hypertension 09/26/2017   Financial difficulties    H/O noncompliance with medical treatment, presenting hazards to health    Hardware complicating wound infection (HCC) 09/25/2022   Non-insulin treated type 2 diabetes mellitus (HCC) 09/26/2017   Persistent atrial fibrillation (HCC)    Sleep apnea suspected 09/30/2017    Social History   Tobacco Use   Smoking status: Former    Current packs/day: 0.00    Types: Cigarettes    Start date: 03/19/2003    Quit date: 03/18/2018    Years since quitting: 5.0   Smokeless tobacco: Never   Tobacco comments:    09/26/2017 "2-3 cigarettes/month now"  Vaping Use   Vaping status: Never Used  Substance Use Topics   Alcohol use: Not Currently    Alcohol/week: 3.0 standard drinks of alcohol    Types: 3 Cans of beer per week    Comment: States he quit drinking 8-9 months ago   Drug use: Never    Family History  Problem Relation Age of Onset   Hypertension Mother     No Known Allergies  Health Maintenance  Topic Date Due   COVID-19 Vaccine (1) Never done   FOOT EXAM  Never done   OPHTHALMOLOGY EXAM  Never done   Zoster Vaccines- Shingrix (1 of 2) 06/01/2023 (Originally 10/02/1979)   INFLUENZA VACCINE  07/03/2023 (Originally 11/03/2022)   HEMOGLOBIN A1C  08/29/2023   Diabetic kidney evaluation - eGFR measurement  01/09/2024   Diabetic kidney evaluation - Urine ACR  02/29/2024    DTaP/Tdap/Td (2 - Tdap) 08/25/2029   Hepatitis C Screening  Completed   HIV Screening  Completed  HPV VACCINES  Aged Out   Colonoscopy  Discontinued    Objective:  Vitals:   03/20/23 1513  BP: 127/82  Pulse: 74  Temp: (!) 97 F (36.1 C)  TempSrc: Temporal  SpO2: 98%  Weight: 170 lb (77.1 kg)  Height: 6' (1.829 m)   Body mass index is 23.06 kg/m.  Physical Exam Constitutional:      General: He is not in acute distress.    Appearance: He is normal weight. He is not toxic-appearing.  HENT:     Head: Normocephalic and atraumatic.     Right Ear: External ear normal.     Left Ear: External ear normal.     Nose: No congestion or rhinorrhea.     Mouth/Throat:     Mouth: Mucous membranes are moist.     Pharynx: Oropharynx is clear.  Eyes:     Extraocular Movements: Extraocular movements intact.     Conjunctiva/sclera: Conjunctivae normal.     Pupils: Pupils are equal, round, and reactive to light.  Cardiovascular:     Rate and Rhythm: Normal rate and regular rhythm.     Heart sounds: No murmur heard.    No friction rub. No gallop.  Pulmonary:     Effort: Pulmonary effort is normal.     Breath sounds: Normal breath sounds.  Abdominal:     General: Abdomen is flat. Bowel sounds are normal.     Palpations: Abdomen is soft.  Musculoskeletal:        General: No swelling. Normal range of motion.     Cervical back: Normal range of motion and neck supple.  Skin:    General: Skin is warm and dry.  Neurological:     General: No focal deficit present.     Mental Status: He is oriented to person, place, and time.  Psychiatric:        Mood and Affect: Mood normal.        Lab Results Lab Results  Component Value Date   WBC 6.6 12/12/2022   HGB 14.7 12/12/2022   HCT 43.6 12/12/2022   MCV 90.1 12/12/2022   PLT 222 12/12/2022    Lab Results  Component Value Date   CREATININE 1.28 (H) 01/09/2023   BUN 21 01/09/2023   NA 136 01/09/2023   K 4.9 03/01/2023   CL 100  01/09/2023   CO2 21 01/09/2023    Lab Results  Component Value Date   ALT 13 01/09/2023   AST 16 01/09/2023   ALKPHOS 116 01/09/2023   BILITOT 1.4 (H) 01/09/2023    Lab Results  Component Value Date   CHOL 199 01/09/2023   HDL 36 (L) 01/09/2023   LDLCALC 121 (H) 01/09/2023   TRIG 235 (H) 01/09/2023   CHOLHDL 5.9 (H) 12/14/2021   No results found for: "LABRPR", "RPRTITER" No results found for: "HIV1RNAQUANT", "HIV1RNAVL", "CD4TABS"   Problem List Items Addressed This Visit   None Visit Diagnoses       Dysuria    -  Primary   Relevant Orders   Urinalysis, Routine w reflex microscopic (Completed)   Urine Culture (Completed)      Results   Procedure: Wound Dressing Description: Redressed the laceration on the plantar surface of the hallux. Applied Neosporin and a sterile bandage.      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  -SP extensive I&D of T spine Cx+ MRSA on 5/28. given hardware needs suppression. Treated with dapto till 6/5->doxy indefinitely -Follows with [facility]Y for back pain  -esr and crp 9/9 nl Plan: -On doxy suppresion -Labs today - Foot and back wound without signs of infection     #DM-7.1 on 03/01/23 -Counseled on glycemic control Assessment and Plan    Urinary Symptoms New onset of dysuria and discomfort on initiation of urination. No fever or chills. Unclear if this is a urinary tract infection. -Order urinalysis and urine culture. -Start empirically Bactrim for possible UTI.               Danelle Earthly, MD [facility] 03/23/2023, 4:13  PM   I have personally spent 35 minutes involved in face-to-face and non-face-to-face activities for this patient on the day of the visit. Professional time spent includes the following activities: Preparing to see the patient (review of tests), Obtaining and/or reviewing separately obtained history (admission/discharge record), Performing a medically appropriate examination and/or evaluation , Ordering medications/tests/procedures, referring and communicating with other health care professionals, Documenting clinical information in the EMR, Independently interpreting results (not separately reported), Communicating results to the patient/family/caregiver, Counseling and educating the patient/family/caregiver and Care coordination (not separately reported).

## 2023-03-21 LAB — URINALYSIS, ROUTINE W REFLEX MICROSCOPIC
Bilirubin Urine: NEGATIVE
Glucose, UA: NEGATIVE
Hyaline Cast: NONE SEEN /[LPF]
Ketones, ur: NEGATIVE
Nitrite: NEGATIVE
RBC / HPF: >=60 /[HPF] — AB (ref 0–2)
Specific Gravity, Urine: 1.013 (ref 1.001–1.035)
Squamous Epithelial / HPF: NONE SEEN /[HPF] (ref <=5)
WBC, UA: >=60 /[HPF] — AB (ref 0–5)
pH: 6 (ref 5.0–8.0)

## 2023-03-21 LAB — MICROSCOPIC MESSAGE

## 2023-03-22 LAB — URINE CULTURE
MICRO NUMBER:: 15855816
SPECIMEN QUALITY:: ADEQUATE

## 2023-03-22 NOTE — Therapy (Signed)
OUTPATIENT PHYSICAL THERAPY THORACOLUMBAR EVALUATION   Patient Name: David [name] MRN: [id number] DOB:[birth year], 62 y.o., male Today's Date: 03/23/2023  END OF SESSION:  PT End of Session - 03/23/23 0930     Visit Number 1    Date for PT Re-Evaluation 05/18/23    Authorization Type Medicaid-[employer]    Authorization - Visit Number 11   this year   Authorization - Number of Visits 27    PT Start Time 0847    PT Stop Time 0931    PT Time Calculation (min) 44 min    Activity Tolerance Patient tolerated treatment well    Behavior During Therapy WFL for tasks assessed/performed             Past Medical History:  Diagnosis Date   Cardiomyopathy (HCC)    a. EF 45% in 2019.   CHF (congestive heart failure) (HCC)    Chronic anticoagulation 09/30/2017   Diabetes mellitus type 2 in nonobese [facility])    Discitis of thoracic region 09/25/2022   Does not have health insurance    Essential hypertension 09/26/2017   Financial difficulties    H/O noncompliance with medical treatment, presenting hazards to health    Hardware complicating wound infection (HCC) 09/25/2022   Non-insulin treated type 2 diabetes mellitus (HCC) 09/26/2017   Persistent atrial fibrillation (HCC)    Sleep apnea suspected 09/30/2017   Past Surgical History:  Procedure Laterality Date   ABDOMINAL AORTOGRAM W/LOWER EXTREMITY N/A 09/02/2021   Procedure: ABDOMINAL AORTOGRAM W/LOWER EXTREMITY;  Surgeon: Nada Libman, MD;  [facility]: [facility];  Service: Cardiovascular;  Laterality: N/A;   ABDOMINAL AORTOGRAM W/LOWER EXTREMITY N/A 12/28/2021   Procedure: ABDOMINAL AORTOGRAM W/LOWER EXTREMITY;  Surgeon: Nada Libman, MD;  Location: [facility];  Service: Cardiovascular;  Laterality: N/A;   ABDOMINAL AORTOGRAM W/LOWER EXTREMITY N/A 07/28/2022   Procedure: ABDOMINAL AORTOGRAM W/LOWER EXTREMITY;  Surgeon: Cephus Shelling, MD;  Location: [facility];  Service: Cardiovascular;   Laterality: N/A;   AMPUTATION Right 09/04/2021   Procedure: 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;  Surgeon: Nadara Mustard, MD;  Location: [facility] OR;  Service: Orthopedics;  Laterality: Right;   AMPUTATION Right 08/02/2022   Procedure: AMPUTATION RAY 5TH METATARSAL OF RIGHT FOOT;  Surgeon: [name], MD;  Location: [facility] OR;  Service: Orthopedics;  Laterality: Right;   APPENDECTOMY  1971   BONE BIOPSY Right 08/02/2022   Procedure: BONE BIOPSY OF 5TH METATARSAL RIGHT FOOT;  Surgeon: [name], MD;  Location: [facility] OR;  Service: Orthopedics;  Laterality: Right;   CARDIOVERSION N/A 09/28/2017   Procedure: CARDIOVERSION;  Surgeon: Thurmon Fair, MD;  [facility]: [facility];  Service: Cardiovascular;  Laterality: N/A;   IRRIGATION AND DEBRIDEMENT FOOT  08/02/2022   Procedure: IRRIGATION AND DEBRIDEMENT RIGHT 5TH METATARSAL FOOT;  Surgeon: [name], MD;  Location: [facility] OR;  Service: Orthopedics;;   LAMINECTOMY WITH POSTERIOR LATERAL ARTHRODESIS LEVEL 4 N/A 08/30/2022   Procedure: THORACIC FIVE-THORACIC ELEVEN FUSION, THORACIC EIGHT TRANSPEDICULAR DECOMPRESSION AND PARTIAL CORPECTOMY with O-Arm;  Surgeon: Bedelia Person, MD;  Location: [facility] OR;  Service: Neurosurgery;  Laterality: N/A;   PERIPHERAL VASCULAR BALLOON ANGIOPLASTY  09/02/2021   Procedure: PERIPHERAL VASCULAR BALLOON ANGIOPLASTY;  Surgeon: Nada Libman, MD;  [facility];  Service: Cardiovascular;;   PERIPHERAL VASCULAR BALLOON ANGIOPLASTY  12/28/2021   Procedure: PERIPHERAL VASCULAR BALLOON ANGIOPLASTY;  Surgeon: Nada Libman, MD;  [facility];  Service: Cardiovascular;;  Posterior Tibial PTA only   TEE WITHOUT CARDIOVERSION N/A 09/28/2017   Procedure: TRANSESOPHAGEAL ECHOCARDIOGRAM (TEE);  Surgeon: Thurmon Fair, MD;  Location: [facility] ENDOSCOPY;  Service: Cardiovascular;  Laterality: N/A;   TEE WITHOUT CARDIOVERSION N/A 09/03/2021   Procedure: TRANSESOPHAGEAL ECHOCARDIOGRAM (TEE);  Surgeon: Chilton Si, MD;  Location: [facility] ENDOSCOPY;  Service: Cardiovascular;  Laterality: N/A;   TEE WITHOUT CARDIOVERSION N/A 07/15/2022   Procedure: TRANSESOPHAGEAL ECHOCARDIOGRAM;  Surgeon: Thurmon Fair, MD;  [facility];  Service: Cardiovascular;  Laterality: N/A;   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 08/08/2022   MRSA bacteremia 08/08/2022   AKI (acute kidney injury), ruled out 07/27/2022   Discitis thoracic region 07/27/2022   Bacteremia due to methicillin resistant Staphylococcus aureus 07/27/2022   Severe protein-calorie malnutrition (HCC) 07/26/2022   PVD (peripheral vascular disease) (HCC) 07/26/2022   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   Bacteremia 07/15/2022   Acute respiratory failure with hypoxia (HCC) 07/12/2022   Atelectasis 07/12/2022   Sepsis due to pneumonia (HCC) 07/12/2022   Gangrene of toe of right foot (HCC)    Sepsis (HCC) 08/29/2021   SIRS (systemic inflammatory response syndrome) (HCC) 08/29/2021   Hyponatremia 08/29/2021   Hypomagnesemia 08/29/2021   Pure hypercholesterolemia 06/17/2021   Pain of left hand 09/06/2019   Cellulitis 08/26/2019   Persistent atrial fibrillation (HCC) 08/26/2019   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) 10/18/2018   Transaminitis 10/17/2018   Hyperbilirubinemia 10/17/2018   Leukocytosis 10/17/2018   Sinusitis 10/17/2018    Chronic anticoagulation 09/30/2017   Smoker 09/30/2017   Cardiomyopathy (HCC) 09/30/2017   Sleep apnea suspected 09/30/2017   Essential hypertension 09/26/2017   Uncontrolled type 2 diabetes with hyperglycemia and hypoglycemia, insulin long term use 09/26/2017   Atrial fibrillation with RVR (HCC) 09/26/2017    PCP: [name], MD  REFERRING PROVIDER: Clovis Riley, PA-C   REFERRING DIAG:  Diagnosis  M43.24 (ICD-10-CM) - Fusion of spine, thoracic region    Rationale for Evaluation and Treatment: Rehabilitation  THERAPY DIAG:  Unsteadiness on feet  Difficulty in walking, not elsewhere classified  Muscle weakness (generalized)  Abnormal posture  ONSET DATE: thoracic fusion 08/30/22  SUBJECTIVE:  SUBJECTIVE STATEMENT: Pt had thoracic fusion T5-8 on 08/30/22 and had PT after to address balance, strength and endurance.  Pt was in the hospital 4.5 months, had sepsis and MRSA leading into thoracic surgery.  After surgery, he had Lt LE paralysis and he has now gone from wheelchair to walker to now quad cane.  He was a Photographer prior to illness and surgery.  Pt had PT at our neuro rehab clinic and was doing aquatics.  He stopped 02/09/23 when he rolled his ankle and had pain associated.  He returns today to resume treatment.    PERTINENT HISTORY:  Per patient, he was admitted to the local hospital on Aug 20, 2022 and had back surgery thereafter. Patient was then readmitted after being discharged for infection with low BG. He was discharged on October 28, 2022 on oral antibiotics with changes to his insulin regimen. past medical history of PVD, type 2 diabetes with hyperglycemia/hypoglycemia, seizure disorders, hypertension, hyperlipidemia, A-fib (on Eliquis) and recent bacteremia.  He is s/p  angioplasty.  Patient reports since discharge he has been doing physical therapy/exercises at home but continues to have severe back pain    PAIN:  Are you having pain? Yes: NPRS scale: 6-9/10 Pain location: incision in thoracic spine and wraps around ribs Pain description: radiating, intense Aggravating factors: activity, movement, constant  Relieving factors: aleve,   PRECAUTIONS: Fall  RED FLAGS: None   WEIGHT BEARING RESTRICTIONS: No  FALLS:  Has patient fallen in last 6 months? Yes. Number of falls 1, tripped on a pillow-PT will address balance during treatment   LIVING ENVIRONMENT: Lives with: lives with their spouse Lives in: House/apartment Stairs: No Has following equipment at home: Single point cane and Quad cane small base  OCCUPATION: was a Investment banker, operational prior to illness and surgery  PLOF: Independent with basic ADLs, Independent with household mobility with device, Requires assistive device for independence, Vocation/Vocational requirements: was a Investment banker, operational prior to illness and surgery, and Leisure: walk outside, played tennis prior to injury  PATIENT GOALS: play drums, wean from cane, walk for exercise  NEXT MD VISIT: 04/28/23  OBJECTIVE:  Note: Objective measures were completed at Evaluation unless otherwise noted.  COGNITION: Overall cognitive status: Within functional limits for tasks assessed     SENSATION: Limited sensation in bil feet due to neuropathy    POSTURE: rounded shoulders, forward head, and flexed trunk   PALPATION: NA  LOWER EXTREMITY ROM:     WFLs  LOWER EXTREMITY MMT:    Not formally tested- uncontrolled descent with stand to sit, genu recurvatum with gait bilaterally due to quad weakness, unstable pelvic muscles   FUNCTIONAL TESTS:  03/23/23: 5 times sit to stand:12.5 seconds- min UE support Timed up and go (TUG): 15.17 with cane 6 minute walk test:  no cane, used gait belt 643 feet  (age related norm is 2078 feet)  GAIT: Distance  walked: 100 Assistive device utilized: Quad cane small base Level of assistance: Modified independence Comments: instability, bil hip and knee instability Lt>Rt  TREATMENT  DATE: 03/22/23 HEP established- see below  If treatment provided at initial evaluation, no treatment charged due to lack of authorization.  PATIENT EDUCATION:  Education details: [email or web address] Person educated: Patient Education method: Explanation, Demonstration, and Handouts Education comprehension: verbalized understanding and returned demonstration  HOME EXERCISE PROGRAM: Access Code: [email or web address] URL: [email or web address] Date: 03/23/2023 Prepared by: Tresa Endo  Exercises - Seated Knee Extension with Resistance  - 1 x daily - 7 x weekly - 3 sets - 10 reps - Seated Hamstring Curls with Resistance  - 1 x daily - 7 x weekly - 3 sets - 10 reps - Seated Hip Abduction with Resistance  - 1 x daily - 7 x weekly - 3 sets - 10 reps - Seated Hip Adduction Isometrics with Ball  - 1 x daily - 7 x weekly - 3 sets - 10 reps - Hooklying Clamshell with Resistance  - 1 x daily - 7 x weekly - 3 sets - 10 reps - Seated Shoulder Horizontal Abduction with Resistance  - 1 x daily - 7 x weekly - 3 sets - 10 reps - Seated Shoulder Row with Resistance Anchored at Feet  - 1 x daily - 7 x weekly - 3 sets - 10 reps - Alternating Step Taps with Counter Support  - 1 x daily - 5 x weekly - 2 sets - 10 reps - Standing Gastroc Stretch at Counter  - 1-2 x daily - 7 x weekly - 1 sets - 3 reps - 30 sec hold  ASSESSMENT:  CLINICAL IMPRESSION: Patient is a 62 y.o. male who was seen today for physical therapy evaluation and treatment for strength, endurance and gait training.  Pt has a complex medical history listed above after hospitalization, MRSA/sepsis and thoracic fusion.  Pt had Lt LE weakness/paralysis  after surgery and went from wheelchair to walker to now quad cane.  He had PT at our neuro clinic until last month and had to pause treatment due to Lt ankle pain.  He returns today for evaluation to resume treatment.  Pt demonstrates falls risk and functional mobility restrictions with significant hip and knee stability.  TUG, 5x sit to stand and 6 min walk tests indicated falls risk and community mobility limitations.  Patient will benefit from skilled PT to address the below impairments and improve overall function.    OBJECTIVE IMPAIRMENTS: Abnormal gait, decreased activity tolerance, decreased balance, decreased coordination, decreased endurance, decreased mobility, difficulty walking, decreased ROM, decreased strength, decreased safety awareness, hypomobility, postural dysfunction, and pain.   ACTIVITY LIMITATIONS: carrying, lifting, bending, standing, squatting, stairs, transfers, hygiene/grooming, locomotion level, and caring for others  PARTICIPATION LIMITATIONS: meal prep, cleaning, laundry, shopping, community activity, and occupation  PERSONAL FACTORS: Past/current experiences, Time since onset of injury/illness/exacerbation, Transportation, and 1-2 comorbidities: 4.5 month hospitalization, thoracic fusion  are also affecting patient's functional outcome.   REHAB POTENTIAL: Good  CLINICAL DECISION MAKING: Evolving/moderate complexity  EVALUATION COMPLEXITY: Moderate   GOALS: Goals reviewed with patient? Yes  SHORT TERM GOALS: Target date: 04/20/2023    Be independent in initial HEP Baseline: Goal status: INITIAL  2.  Improve LE strength to perform sit to stand with controlled descent  Baseline:  Goal status: INITIAL  3.  Perform 5x sit to stand in < or = to 13 seconds  Baseline:  Goal status: INITIAL  4.  Perform TUG in < or = to 13 seconds with cane  Baseline: 15.17 seconds  Goal status: INITIAL  5.  Ambulate 800 feet with 6 minute walk test  Baseline: 643  feet Goal status: INITIAL    LONG TERM GOALS: Target date: 05/18/2023  Be independent in advanced HEP Baseline:  Goal status: INITIAL  2.  Perform 5x sit to stand with good control with descent in < or = to 13 seconds  Baseline: uncontrolled descent and use of backs of legs  Goal status: INITIAL  3.  Ambulate 875 feet in 6 minute walk test  Baseline: 643 feet  Goal status: INITIAL  4.  Perform TUG in < or = to 12 seconds with use of cane  Baseline: 15.17 with cane  Goal status: INITIAL   PLAN:  PT FREQUENCY: 3x/week  PT DURATION: 8 weeks  PLANNED INTERVENTIONS: 97110-Therapeutic exercises, 97530- Therapeutic activity, [id number]- Neuromuscular re-education, 97535- Self Care, 16109- Manual therapy, [id number]- Gait training, [phone]- Canalith repositioning, [id number]- Aquatic Therapy, 97014- Electrical stimulation (unattended), [phone]- Electrical stimulation (manual), Patient/Family education, Balance training, Stair training, Dry Needling, Spinal manipulation, Spinal mobilization, Vestibular training, Cryotherapy, and Moist heat.  PLAN FOR NEXT SESSION: work on sit to stand, endurance, gait, strength   Lorrene Reid, PT 03/23/23 11:16 AM  [facility] [address], [address] [place], [place] [address] Phone # [phone] Fax [phone]

## 2023-03-23 ENCOUNTER — Other Ambulatory Visit: Payer: Self-pay

## 2023-03-23 ENCOUNTER — Ambulatory Visit: Payer: Medicaid Other

## 2023-03-23 DIAGNOSIS — R262 Difficulty in walking, not elsewhere classified: Secondary | ICD-10-CM

## 2023-03-23 DIAGNOSIS — R2681 Unsteadiness on feet: Secondary | ICD-10-CM

## 2023-03-23 DIAGNOSIS — M6281 Muscle weakness (generalized): Secondary | ICD-10-CM | POA: Diagnosis present

## 2023-03-23 DIAGNOSIS — R293 Abnormal posture: Secondary | ICD-10-CM

## 2023-03-30 ENCOUNTER — Other Ambulatory Visit (HOSPITAL_COMMUNITY): Payer: Self-pay

## 2023-03-31 ENCOUNTER — Telehealth: Payer: Self-pay | Admitting: Family Medicine

## 2023-03-31 ENCOUNTER — Other Ambulatory Visit (HOSPITAL_COMMUNITY): Payer: Self-pay

## 2023-03-31 MED ORDER — METHOCARBAMOL 750 MG PO TABS
750.0000 mg | ORAL_TABLET | Freq: Two times a day (BID) | ORAL | 0 refills | Status: DC | PRN
  Filled 2023-03-31: qty 60, 30d supply, fill #0

## 2023-03-31 NOTE — Telephone Encounter (Signed)
RF on methocarbamol sent to [facility] pharmacy.

## 2023-03-31 NOTE — Telephone Encounter (Signed)
Patient was called and informed of medication being refilled . 

## 2023-03-31 NOTE — Telephone Encounter (Signed)
Pt went to the [facility] ER and they have cut off his Methocarbamol.  They gave him a couple of days but he didn't pick it up because he had some.  He wants Dr. Alvis Lemmings to reinstate the Methocarbamol.  QV@  [phone]   [facility]

## 2023-03-31 NOTE — Telephone Encounter (Signed)
Pt is requesting refill

## 2023-04-02 ENCOUNTER — Other Ambulatory Visit (HOSPITAL_COMMUNITY): Payer: Self-pay

## 2023-04-03 ENCOUNTER — Other Ambulatory Visit: Payer: Self-pay

## 2023-04-03 ENCOUNTER — Other Ambulatory Visit (HOSPITAL_COMMUNITY): Payer: Self-pay

## 2023-04-04 ENCOUNTER — Other Ambulatory Visit (HOSPITAL_COMMUNITY): Payer: Self-pay

## 2023-04-09 ENCOUNTER — Other Ambulatory Visit: Payer: Self-pay

## 2023-04-09 ENCOUNTER — Inpatient Hospital Stay (HOSPITAL_COMMUNITY)
Admission: EM | Admit: 2023-04-09 | Discharge: 2023-05-02 | DRG: 233 | Disposition: A | Discharge status: Skilled Nursing Facility | Payer: Medicaid Other | Attending: Surgery | Admitting: Surgery

## 2023-04-09 ENCOUNTER — Emergency Department (HOSPITAL_COMMUNITY): Payer: Medicaid Other

## 2023-04-09 ENCOUNTER — Encounter (HOSPITAL_COMMUNITY): Payer: Self-pay

## 2023-04-09 DIAGNOSIS — Z87891 Personal history of nicotine dependence: Secondary | ICD-10-CM

## 2023-04-09 DIAGNOSIS — J9601 Acute respiratory failure with hypoxia: Secondary | ICD-10-CM

## 2023-04-09 DIAGNOSIS — I5033 Acute on chronic diastolic (congestive) heart failure: Secondary | ICD-10-CM | POA: Diagnosis present

## 2023-04-09 DIAGNOSIS — E7800 Pure hypercholesterolemia, unspecified: Secondary | ICD-10-CM | POA: Diagnosis present

## 2023-04-09 DIAGNOSIS — I130 Hypertensive heart and chronic kidney disease with heart failure and stage 1 through stage 4 chronic kidney disease, or unspecified chronic kidney disease: Secondary | ICD-10-CM | POA: Diagnosis present

## 2023-04-09 DIAGNOSIS — I480 Paroxysmal atrial fibrillation: Secondary | ICD-10-CM | POA: Diagnosis not present

## 2023-04-09 DIAGNOSIS — Z981 Arthrodesis status: Secondary | ICD-10-CM

## 2023-04-09 DIAGNOSIS — K567 Ileus, unspecified: Secondary | ICD-10-CM | POA: Diagnosis not present

## 2023-04-09 DIAGNOSIS — I5021 Acute systolic (congestive) heart failure: Secondary | ICD-10-CM | POA: Insufficient documentation

## 2023-04-09 DIAGNOSIS — I10 Essential (primary) hypertension: Secondary | ICD-10-CM | POA: Diagnosis present

## 2023-04-09 DIAGNOSIS — I2510 Atherosclerotic heart disease of native coronary artery without angina pectoris: Secondary | ICD-10-CM | POA: Diagnosis present

## 2023-04-09 DIAGNOSIS — I4821 Permanent atrial fibrillation: Secondary | ICD-10-CM | POA: Diagnosis present

## 2023-04-09 DIAGNOSIS — Z7901 Long term (current) use of anticoagulants: Secondary | ICD-10-CM

## 2023-04-09 DIAGNOSIS — E1140 Type 2 diabetes mellitus with diabetic neuropathy, unspecified: Secondary | ICD-10-CM | POA: Diagnosis present

## 2023-04-09 DIAGNOSIS — I340 Nonrheumatic mitral (valve) insufficiency: Secondary | ICD-10-CM | POA: Diagnosis present

## 2023-04-09 DIAGNOSIS — Z8614 Personal history of Methicillin resistant Staphylococcus aureus infection: Secondary | ICD-10-CM | POA: Diagnosis present

## 2023-04-09 DIAGNOSIS — I25810 Atherosclerosis of coronary artery bypass graft(s) without angina pectoris: Secondary | ICD-10-CM | POA: Diagnosis present

## 2023-04-09 DIAGNOSIS — Z794 Long term (current) use of insulin: Secondary | ICD-10-CM

## 2023-04-09 DIAGNOSIS — F4321 Adjustment disorder with depressed mood: Secondary | ICD-10-CM | POA: Diagnosis present

## 2023-04-09 DIAGNOSIS — Z89421 Acquired absence of other right toe(s): Secondary | ICD-10-CM

## 2023-04-09 DIAGNOSIS — E871 Hypo-osmolality and hyponatremia: Secondary | ICD-10-CM | POA: Diagnosis present

## 2023-04-09 DIAGNOSIS — Z7984 Long term (current) use of oral hypoglycemic drugs: Secondary | ICD-10-CM

## 2023-04-09 DIAGNOSIS — E1169 Type 2 diabetes mellitus with other specified complication: Secondary | ICD-10-CM | POA: Diagnosis not present

## 2023-04-09 DIAGNOSIS — N179 Acute kidney failure, unspecified: Secondary | ICD-10-CM | POA: Diagnosis present

## 2023-04-09 DIAGNOSIS — Z79899 Other long term (current) drug therapy: Secondary | ICD-10-CM

## 2023-04-09 DIAGNOSIS — I4819 Other persistent atrial fibrillation: Secondary | ICD-10-CM

## 2023-04-09 DIAGNOSIS — Z1152 Encounter for screening for COVID-19: Secondary | ICD-10-CM

## 2023-04-09 DIAGNOSIS — I214 Non-ST elevation (NSTEMI) myocardial infarction: Principal | ICD-10-CM | POA: Diagnosis present

## 2023-04-09 DIAGNOSIS — E1165 Type 2 diabetes mellitus with hyperglycemia: Secondary | ICD-10-CM | POA: Diagnosis present

## 2023-04-09 DIAGNOSIS — Z5986 Financial insecurity: Secondary | ICD-10-CM

## 2023-04-09 DIAGNOSIS — E119 Type 2 diabetes mellitus without complications: Secondary | ICD-10-CM

## 2023-04-09 DIAGNOSIS — E872 Acidosis, unspecified: Secondary | ICD-10-CM | POA: Diagnosis present

## 2023-04-09 DIAGNOSIS — R7989 Other specified abnormal findings of blood chemistry: Secondary | ICD-10-CM

## 2023-04-09 DIAGNOSIS — E1122 Type 2 diabetes mellitus with diabetic chronic kidney disease: Secondary | ICD-10-CM | POA: Diagnosis present

## 2023-04-09 DIAGNOSIS — Z79891 Long term (current) use of opiate analgesic: Secondary | ICD-10-CM

## 2023-04-09 DIAGNOSIS — R001 Bradycardia, unspecified: Secondary | ICD-10-CM | POA: Diagnosis not present

## 2023-04-09 DIAGNOSIS — I25700 Atherosclerosis of coronary artery bypass graft(s), unspecified, with unstable angina pectoris: Secondary | ICD-10-CM | POA: Diagnosis present

## 2023-04-09 DIAGNOSIS — N170 Acute kidney failure with tubular necrosis: Secondary | ICD-10-CM | POA: Diagnosis not present

## 2023-04-09 DIAGNOSIS — Z5982 Transportation insecurity: Secondary | ICD-10-CM

## 2023-04-09 DIAGNOSIS — G8929 Other chronic pain: Secondary | ICD-10-CM | POA: Diagnosis present

## 2023-04-09 DIAGNOSIS — D696 Thrombocytopenia, unspecified: Secondary | ICD-10-CM | POA: Diagnosis not present

## 2023-04-09 DIAGNOSIS — F419 Anxiety disorder, unspecified: Secondary | ICD-10-CM | POA: Diagnosis present

## 2023-04-09 DIAGNOSIS — E1151 Type 2 diabetes mellitus with diabetic peripheral angiopathy without gangrene: Secondary | ICD-10-CM | POA: Diagnosis present

## 2023-04-09 DIAGNOSIS — M545 Low back pain, unspecified: Secondary | ICD-10-CM | POA: Diagnosis present

## 2023-04-09 DIAGNOSIS — N1832 Chronic kidney disease, stage 3b: Secondary | ICD-10-CM | POA: Diagnosis present

## 2023-04-09 DIAGNOSIS — K219 Gastro-esophageal reflux disease without esophagitis: Secondary | ICD-10-CM | POA: Diagnosis present

## 2023-04-09 DIAGNOSIS — R34 Anuria and oliguria: Secondary | ICD-10-CM | POA: Diagnosis not present

## 2023-04-09 DIAGNOSIS — I493 Ventricular premature depolarization: Secondary | ICD-10-CM | POA: Diagnosis not present

## 2023-04-09 DIAGNOSIS — I959 Hypotension, unspecified: Secondary | ICD-10-CM | POA: Diagnosis not present

## 2023-04-09 DIAGNOSIS — I509 Heart failure, unspecified: Secondary | ICD-10-CM | POA: Diagnosis not present

## 2023-04-09 DIAGNOSIS — I272 Pulmonary hypertension, unspecified: Secondary | ICD-10-CM | POA: Diagnosis present

## 2023-04-09 DIAGNOSIS — Z8249 Family history of ischemic heart disease and other diseases of the circulatory system: Secondary | ICD-10-CM

## 2023-04-09 DIAGNOSIS — I429 Cardiomyopathy, unspecified: Secondary | ICD-10-CM | POA: Diagnosis present

## 2023-04-09 DIAGNOSIS — Z951 Presence of aortocoronary bypass graft: Secondary | ICD-10-CM

## 2023-04-09 DIAGNOSIS — I472 Ventricular tachycardia, unspecified: Secondary | ICD-10-CM | POA: Diagnosis not present

## 2023-04-09 DIAGNOSIS — Z7982 Long term (current) use of aspirin: Secondary | ICD-10-CM

## 2023-04-09 DIAGNOSIS — G894 Chronic pain syndrome: Secondary | ICD-10-CM | POA: Diagnosis present

## 2023-04-09 DIAGNOSIS — I4891 Unspecified atrial fibrillation: Secondary | ICD-10-CM | POA: Diagnosis present

## 2023-04-09 LAB — CBC WITH DIFFERENTIAL/PLATELET
Abs Immature Granulocytes: 0.05 10*3/uL (ref 0.00–0.07)
Basophils Absolute: 0.1 10*3/uL (ref 0.0–0.1)
Basophils Relative: 1 %
Eosinophils Absolute: 0.2 10*3/uL (ref 0.0–0.5)
Eosinophils Relative: 2 %
HCT: 38.3 % — ABNORMAL LOW (ref 39.0–52.0)
Hemoglobin: 13.3 g/dL (ref 13.0–17.0)
Immature Granulocytes: 0 %
Lymphocytes Relative: 8 %
Lymphs Abs: 1 10*3/uL (ref 0.7–4.0)
MCH: 32.7 pg (ref 26.0–34.0)
MCHC: 34.7 g/dL (ref 30.0–36.0)
MCV: 94.1 fL (ref 80.0–100.0)
Monocytes Absolute: 1.2 10*3/uL — ABNORMAL HIGH (ref 0.1–1.0)
Monocytes Relative: 10 %
Neutro Abs: 9.8 10*3/uL — ABNORMAL HIGH (ref 1.7–7.7)
Neutrophils Relative %: 79 %
Platelets: 194 10*3/uL (ref 150–400)
RBC: 4.07 MIL/uL — ABNORMAL LOW (ref 4.22–5.81)
RDW: 12.9 % (ref 11.5–15.5)
WBC: 12.3 10*3/uL — ABNORMAL HIGH (ref 4.0–10.5)
nRBC: 0 % (ref 0.0–0.2)

## 2023-04-09 LAB — BASIC METABOLIC PANEL
Anion gap: 10 (ref 5–15)
BUN: 26 mg/dL — ABNORMAL HIGH (ref 8–23)
CO2: 22 mmol/L (ref 22–32)
Calcium: 9.4 mg/dL (ref 8.9–10.3)
Chloride: 102 mmol/L (ref 98–111)
Creatinine, Ser: 1.42 mg/dL — ABNORMAL HIGH (ref 0.61–1.24)
GFR, Estimated: 56 mL/min — ABNORMAL LOW (ref >60)
Glucose, Bld: 141 mg/dL — ABNORMAL HIGH (ref 70–99)
Potassium: 3.8 mmol/L (ref 3.5–5.1)
Sodium: 134 mmol/L — ABNORMAL LOW (ref 135–145)

## 2023-04-09 LAB — TROPONIN I (HIGH SENSITIVITY)
Troponin I (High Sensitivity): 210 ng/L (ref <18)
Troponin I (High Sensitivity): 259 ng/L (ref <18)
Troponin I (High Sensitivity): 253 ng/L (ref <18)

## 2023-04-09 LAB — GLUCOSE, CAPILLARY: Glucose-Capillary: 116 mg/dL — ABNORMAL HIGH (ref 70–99)

## 2023-04-09 LAB — URINALYSIS, ROUTINE W REFLEX MICROSCOPIC
Bacteria, UA: NONE SEEN
Bilirubin Urine: NEGATIVE
Glucose, UA: >=500 mg/dL — AB
Ketones, ur: NEGATIVE mg/dL
Leukocytes,Ua: NEGATIVE
Nitrite: NEGATIVE
Protein, ur: 100 mg/dL — AB
Specific Gravity, Urine: 1.007 (ref 1.005–1.030)
pH: 6 (ref 5.0–8.0)

## 2023-04-09 LAB — DIGOXIN LEVEL: Digoxin Level: 0.2 ng/mL — ABNORMAL LOW (ref 0.8–2.0)

## 2023-04-09 LAB — RESP PANEL BY RT-PCR (RSV, FLU A&B, COVID)  RVPGX2
Influenza A by PCR: NEGATIVE
Influenza B by PCR: NEGATIVE
Resp Syncytial Virus by PCR: NEGATIVE
SARS Coronavirus 2 by RT PCR: NEGATIVE

## 2023-04-09 LAB — HEPATIC FUNCTION PANEL
ALT: 15 U/L (ref 0–44)
AST: 16 U/L (ref 15–41)
Albumin: 3.5 g/dL (ref 3.5–5.0)
Alkaline Phosphatase: 75 U/L (ref 38–126)
Bilirubin, Direct: 0.2 mg/dL (ref 0.0–0.2)
Indirect Bilirubin: 1.3 mg/dL — ABNORMAL HIGH (ref 0.3–0.9)
Total Bilirubin: 1.5 mg/dL — ABNORMAL HIGH (ref 0.0–1.2)
Total Protein: 6.6 g/dL (ref 6.5–8.1)

## 2023-04-09 LAB — HIV ANTIBODY (ROUTINE TESTING W REFLEX): HIV Screen 4th Generation wRfx: NONREACTIVE

## 2023-04-09 LAB — BRAIN NATRIURETIC PEPTIDE: B Natriuretic Peptide: 419.7 pg/mL — ABNORMAL HIGH (ref 0.0–100.0)

## 2023-04-09 MED ORDER — DOXYCYCLINE HYCLATE 100 MG PO TABS
100.0000 mg | ORAL_TABLET | Freq: Two times a day (BID) | ORAL | Status: DC
  Administered 2023-04-09 – 2023-04-17 (×17): 100 mg via ORAL
  Filled 2023-04-09 (×17): qty 1

## 2023-04-09 MED ORDER — ONDANSETRON HCL 4 MG/2ML IJ SOLN
4.0000 mg | Freq: Once | INTRAMUSCULAR | Status: AC
Start: 2023-04-09 — End: 2023-04-09
  Administered 2023-04-09: 4 mg via INTRAVENOUS
  Filled 2023-04-09: qty 2

## 2023-04-09 MED ORDER — APIXABAN 5 MG PO TABS
5.0000 mg | ORAL_TABLET | Freq: Two times a day (BID) | ORAL | Status: DC
  Administered 2023-04-09 – 2023-04-10 (×2): 5 mg via ORAL
  Filled 2023-04-09 (×2): qty 1

## 2023-04-09 MED ORDER — POTASSIUM CHLORIDE CRYS ER 20 MEQ PO TBCR
40.0000 meq | EXTENDED_RELEASE_TABLET | Freq: Two times a day (BID) | ORAL | Status: AC
  Administered 2023-04-09 – 2023-04-10 (×2): 40 meq via ORAL
  Filled 2023-04-09 (×2): qty 2

## 2023-04-09 MED ORDER — OXYCODONE HCL 5 MG PO TABS
5.0000 mg | ORAL_TABLET | Freq: Once | ORAL | Status: AC | PRN
  Administered 2023-04-09: 5 mg via ORAL
  Filled 2023-04-09: qty 1

## 2023-04-09 MED ORDER — INSULIN ASPART 100 UNIT/ML IJ SOLN
0.0000 [IU] | Freq: Three times a day (TID) | INTRAMUSCULAR | Status: DC
  Administered 2023-04-10: 11 [IU] via SUBCUTANEOUS
  Administered 2023-04-10: 15 [IU] via SUBCUTANEOUS
  Administered 2023-04-10 – 2023-04-11 (×3): 3 [IU] via SUBCUTANEOUS
  Administered 2023-04-11 – 2023-04-12 (×2): 5 [IU] via SUBCUTANEOUS
  Administered 2023-04-12: 2 [IU] via SUBCUTANEOUS
  Administered 2023-04-13 (×3): 3 [IU] via SUBCUTANEOUS
  Administered 2023-04-14: 2 [IU] via SUBCUTANEOUS
  Administered 2023-04-14: 5 [IU] via SUBCUTANEOUS
  Administered 2023-04-14: 2 [IU] via SUBCUTANEOUS
  Administered 2023-04-15 (×3): 3 [IU] via SUBCUTANEOUS
  Administered 2023-04-16 (×2): 2 [IU] via SUBCUTANEOUS
  Administered 2023-04-16: 3 [IU] via SUBCUTANEOUS
  Administered 2023-04-17: 2 [IU] via SUBCUTANEOUS
  Administered 2023-04-17: 3 [IU] via SUBCUTANEOUS

## 2023-04-09 MED ORDER — SACUBITRIL-VALSARTAN 49-51 MG PO TABS: 1.0000 | ORAL_TABLET | Freq: Two times a day (BID) | ORAL | Status: DC

## 2023-04-09 MED ORDER — METHOCARBAMOL 500 MG PO TABS
750.0000 mg | ORAL_TABLET | Freq: Two times a day (BID) | ORAL | Status: DC | PRN
  Administered 2023-04-09 – 2023-04-17 (×11): 750 mg via ORAL
  Filled 2023-04-09 (×12): qty 2

## 2023-04-09 MED ORDER — FAMOTIDINE 20 MG PO TABS
20.0000 mg | ORAL_TABLET | Freq: Two times a day (BID) | ORAL | Status: DC
  Administered 2023-04-09 – 2023-04-10 (×2): 20 mg via ORAL
  Filled 2023-04-09 (×2): qty 1

## 2023-04-09 MED ORDER — FUROSEMIDE 10 MG/ML IJ SOLN
40.0000 mg | Freq: Once | INTRAMUSCULAR | Status: AC
  Administered 2023-04-09: 40 mg via INTRAVENOUS
  Filled 2023-04-09: qty 4

## 2023-04-09 MED ORDER — MORPHINE SULFATE (PF) 4 MG/ML IV SOLN
4.0000 mg | Freq: Once | INTRAVENOUS | Status: AC
  Administered 2023-04-09: 4 mg via INTRAVENOUS
  Filled 2023-04-09: qty 1

## 2023-04-09 MED ORDER — ROSUVASTATIN CALCIUM 20 MG PO TABS
20.0000 mg | ORAL_TABLET | Freq: Every day | ORAL | Status: DC
  Administered 2023-04-10 – 2023-04-17 (×8): 20 mg via ORAL
  Filled 2023-04-09 (×8): qty 1

## 2023-04-09 MED ORDER — PREGABALIN 25 MG PO CAPS
75.0000 mg | ORAL_CAPSULE | Freq: Every day | ORAL | Status: DC
  Administered 2023-04-09 – 2023-04-17 (×9): 75 mg via ORAL
  Filled 2023-04-09 (×9): qty 3

## 2023-04-09 MED ORDER — DIGOXIN 125 MCG PO TABS
125.0000 ug | ORAL_TABLET | Freq: Every day | ORAL | Status: DC
  Administered 2023-04-10 – 2023-04-13 (×4): 125 ug via ORAL
  Filled 2023-04-09 (×4): qty 1

## 2023-04-09 MED ORDER — OXYCODONE HCL ER 10 MG PO T12A
10.0000 mg | EXTENDED_RELEASE_TABLET | Freq: Two times a day (BID) | ORAL | Status: DC
  Administered 2023-04-09 – 2023-04-10 (×2): 10 mg via ORAL
  Filled 2023-04-09 (×2): qty 1

## 2023-04-09 MED ORDER — FUROSEMIDE 10 MG/ML IJ SOLN: 40.0000 mg | Freq: Once | INTRAMUSCULAR | Status: DC

## 2023-04-09 MED ORDER — INSULIN GLARGINE-YFGN 100 UNIT/ML ~~LOC~~ SOLN
27.0000 [IU] | Freq: Every day | SUBCUTANEOUS | Status: DC
  Administered 2023-04-09 – 2023-04-10 (×2): 27 [IU] via SUBCUTANEOUS
  Filled 2023-04-09 (×3): qty 0.27

## 2023-04-09 NOTE — Hospital Course (Addendum)
 History of Present Illness:    We are asked to see for a 63 year old male cardiothoracic surgical consultation for consideration of coronary artery surgical revascularization.  He has multiple cardiac risk factors including a history of congestive heart failure, diabetes mellitus type 2, hypertension, persistent atrial fibrillation with failed previous DCCV, significant peripheral vascular arterial occlusive disease and possible sleep apnea.  Additionally, he is a former smoker but quit approximately 5 years ago.  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  He has a history of extensive fine surgery due to a prior course of MRSA osteomyelitis.  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  He is on doxycycline  for chronic infection suppression.  He is on chronic anticoagulation with Eliquis .  His congestive heart failure is previously modified as moderate with an ejection fraction of 45%.  He presented to the emergency room after approximately 2 days of progressive shortness of breath.  He has no previous history of heart failure exacerbations requiring admission.  He has some degree of orthopnea at baseline.  In the emergency room he was started on a course of diuresis.  He did have elevation of his high-sensitivity troponins ruling in for non-STEMI.  proBNP was also noted to be elevated.  Cardiology consultation was obtained for further evaluation and management.  Of note he did have a echocardiogram done in April 2024 which at that time revealed normal LVEF with regional impaired findings noted in 2020.  He did have acute renal insufficiency on presentation with creatinine peak of 2.28 and it is trending lower with most recent value today 1.62.   Repeat Echocardiogram done on 04/11/2023 showed ejection fraction at 65 to 70% with normal left ventricular function.  Right ventricular systolic function was mildly reduced.  He had mild  mitral valvular regurgitation without stenosis.  There were no other valvular abnormalities.  He was medically stabilized and underwent cardiac catheterization on today's date where he was found to have severe three-vessel coronary artery disease.  He has made significant clinical improvement in his pulmonary symptoms since his admission.   Mr. Santibanez was evaluated by Dr. Lucas on 04/10/23 who noted:  He presented with worsening shortness of breath and probably having some chest discomfort but vague about that. He ruled in for NSTEMI and echo shows normal LVEF with mild LVH, no significant valvular abnormality. Cath shows diffusely calcified coronary arteries with severe 3V disease. I agree that CABG is the best treatment for him but certainly not straightforward. Hopefully his distal PDA will be graftable. He has high grade stenoses in the distal large OM but these sub-branches are likely intramyocardial and may not be accessible. His LAD looks graftable but has 50% distal stenosis. His operative risk is increased due to poorly controlled DM with Hgb A1c of 7.4, stage 3b CKD with creat up to 2.28 this admission, now 1.65. He was 1.28 in October. He has had severe MRSA infection with toe amputations, major spine surgery for discitis and osteomyelitis in May 2024 on chronic doxycycline  for suppression. He has been on Eliquis  for permanent AF with last dose on Sunday. I would plan on surgery next Tuesday which is our first opening. Plan CABG, pulmonary vein isolation ablation and LAA clip. I discussed the operative procedure with the patient  including alternatives, benefits and risks; including but not limited to bleeding, blood transfusion, infection, stroke, myocardial infarction, graft failure, heart block requiring a  permanent pacemaker, organ dysfunction, and death.  Oneil DELENA Monte understands and agrees to proceed.     Hospital Course: Mr. Carolee remained stable following left heart catheterization.  He was  taken to the op room on 12/17/2023 where CABG x 4 placed utilizing a left internal mammary artery to the left anterior descending coronary artery a sequential saphenous vein graft to the first and third obtuse marginal coronary arteries and a separate saphenous vein graft to the posterior descending coronary artery.  Pulmonary vein isolation was also carried out utilizing the [facility] Clamp.  The left atrial appendage was clipped as well.  Following the procedure, he separated from cardiopulmonary bypass without difficulty and was transferred to the surgical ICU in stable condition.  He was extubated during the afternoon on the day of surgery.  Inotropic support was weaned on the first postoperative day as well as vasopressor support.  IV amiodarone  was added for ventricular bigeminy.  His diabetes and hyperglycemia were managed initially with an insulin  drip that was later transitioned to subcutaneous Levemir  and sliding scale insulin  on the first postoperative day.  The monitoring lines were removed routinely on postop day 1 and the patient was mobilized with the assistance of physical therapy.  Patient was evaluated by the inpatient rehab team for potential admission to the [facility] facility.  Mr. Humbarger developed acute on chronic renal insufficiency after surgery.  By the fourth postoperative day, his creatinine was up to 4.27.  He continued to have adequate urine output.  The nephrology team was consulted.  Supportive care continued.  He remained in rate controlled atrial fibrillation.  A renal ultrasound was obtained showing no evidence of obstruction.  By postop day 5, Mr. Qualley was beginning to have improved response to diuretics.  The serum creatinine began to decrease.  The atrial fibrillation persisted but with better rate control on oral amiodarone . Renal function gradually improved and he was diuresed with high-dose Lasix  with good response. The post-operative ileus also resolved.  Mr.  Dommer was ready for transfer to [facility] on post-op day 8.  He was evaluated by the OT and PT teams and it was felt he would benefit from additional rehab at a skilled nursing facility prior to returning home. The [facility] team was consulted to assist with placement. Mr. Ryser remained in atrial fibrillation with controlled rates on amiodarone .  We continued his doxycycline  for chronic infection suppression as he was taking prior to admission. Glucose was monitored before meals and at bedtime with fair control. Sliding scale insulin  coverage was provided when required. Lasix  was titrated for further diuresis.  He responded well with removal of about 7L of fluid.  He developed a slight increase in his creatinine level and his lasix  dose was decreased to 80 mg BID.  Repeat creatinine level was ***.  He complained of constipation and was treated with Laxative therapy.

## 2023-04-09 NOTE — ED Triage Notes (Signed)
 Pt BIB [facility] from home for increasing SHOB x2 days. Per EMS pt began new ER oxycodone  from back surgery 2 days ago. Hx of CHF, takes and is compliant with Lasix . Pt arrives on 8L non-rebreather with O2 sats between 90-100%. Pt in a-fib with known hx of a-fib.

## 2023-04-09 NOTE — ED Notes (Signed)
 ED TO INPATIENT HANDOFF REPORT  ED Nurse Name and Phone #: [phone]  S Name/Age/Gender Oneil DELENA Monte 63 y.o. male Room/Bed: 019C/019C  Code Status   Code Status: Full Code  Home/SNF/Other Home Patient oriented to: self, place, time, and situation Is this baseline? Yes   Triage Complete: Triage complete  Chief Complaint Acute decompensated heart failure (HCC) [I50.9]  Triage Note Pt BIB [facility] from home for increasing SHOB x2 days. Per EMS pt began new ER oxycodone  from back surgery 2 days ago. Hx of CHF, takes and is compliant with Lasix . Pt arrives on 8L non-rebreather with O2 sats between 90-100%. Pt in a-fib with known hx of a-fib.      Allergies No Known Allergies  Level of Care/Admitting Diagnosis ED Disposition     ED Disposition  Admit   Condition  --   Comment  Hospital Area: [facility] [[id number]]  Level of Care: Telemetry Cardiac [[id number]]  May place patient in observation at [facility] or [facility] if equivalent level of care is available:: No  Covid Evaluation: Confirmed COVID Negative  Diagnosis: Acute decompensated heart failure [facility]) [[id number]]  Admitting Physician: ROSAN DAYTON JAYSON LORINE  Attending Physician: ROSAN DAYTON JAYSON [[id number]]          B Medical/Surgery History Past Medical History:  Diagnosis Date   Cardiomyopathy (HCC)    a. EF 45% in 2019.   CHF (congestive heart failure) (HCC)    Chronic anticoagulation 09/30/2017   Diabetes mellitus type 2 in nonobese [facility])    Discitis of thoracic region 09/25/2022   Does not have health insurance    Essential hypertension 09/26/2017   Financial difficulties    H/O noncompliance with medical treatment, presenting hazards to health    Hardware complicating wound infection (HCC) 09/25/2022   Non-insulin  treated type 2 diabetes mellitus (HCC) 09/26/2017   Persistent atrial fibrillation (HCC)    Sleep apnea suspected 09/30/2017   Past Surgical History:  Procedure Laterality Date    ABDOMINAL AORTOGRAM W/LOWER EXTREMITY N/A 09/02/2021   Procedure: ABDOMINAL AORTOGRAM W/LOWER EXTREMITY;  Surgeon: Serene Gaile ORN, MD;  [facility];  Service: Cardiovascular;  Laterality: N/A;   ABDOMINAL AORTOGRAM W/LOWER EXTREMITY N/A 12/28/2021   Procedure: ABDOMINAL AORTOGRAM W/LOWER EXTREMITY;  Surgeon: Serene Gaile ORN, MD;  [facility]: [facility];  Service: Cardiovascular;  Laterality: N/A;   ABDOMINAL AORTOGRAM W/LOWER EXTREMITY N/A 07/28/2022   Procedure: ABDOMINAL AORTOGRAM W/LOWER EXTREMITY;  Surgeon: Gretta Lonni PARAS, MD;  [facility]: [facility];  Service: Cardiovascular;  Laterality: N/A;   AMPUTATION Right 09/04/2021   Procedure: 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;  Surgeon: Harden Jerona GAILS, MD;  Location: [facility] OR;  Service: Orthopedics;  Laterality: Right;   AMPUTATION Right 08/02/2022   Procedure: AMPUTATION RAY 5TH METATARSAL OF RIGHT FOOT;  Surgeon: Barton Drape, MD;  Location: [facility] OR;  Service: Orthopedics;  Laterality: Right;   APPENDECTOMY  1971   BONE BIOPSY Right 08/02/2022   Procedure: BONE BIOPSY OF 5TH METATARSAL RIGHT FOOT;  Surgeon: Barton Drape, MD;  [facility] OR;  Service: Orthopedics;  Laterality: Right;   CARDIOVERSION N/A 09/28/2017   Procedure: CARDIOVERSION;  Surgeon: Francyne Headland, MD;  [facility]OS[facility];  Service: Cardiovascular;  Laterality: N/A;   IRRIGATION AND DEBRIDEMENT FOOT  08/02/2022   Procedure: IRRIGATION AND DEBRIDEMENT RIGHT 5TH METATARSAL FOOT;  Surgeon: Barton Drape, MD;  Location: [facility] OR;  Service: Orthopedics;;   LAMINECTOMY WITH POSTERIOR LATERAL ARTHRODESIS LEVEL 4 N/A  08/30/2022   Procedure: THORACIC FIVE-THORACIC ELEVEN FUSION, THORACIC EIGHT TRANSPEDICULAR DECOMPRESSION AND PARTIAL CORPECTOMY with O-Arm;  Surgeon: Debby Dorn MATSU, MD;  Location: [facility] OR;  Service: Neurosurgery;  Laterality: N/A;   PERIPHERAL VASCULAR BALLOON ANGIOPLASTY  09/02/2021   Procedure: PERIPHERAL VASCULAR BALLOON  ANGIOPLASTY;  Surgeon: Serene Gaile ORN, MD;  [facility];  Service: Cardiovascular;;   PERIPHERAL VASCULAR BALLOON ANGIOPLASTY  12/28/2021   Procedure: PERIPHERAL VASCULAR BALLOON ANGIOPLASTY;  Surgeon: Serene Gaile ORN, MD;  [facility];  Service: Cardiovascular;;  Posterior Tibial PTA only   TEE WITHOUT CARDIOVERSION N/A 09/28/2017   Procedure: TRANSESOPHAGEAL ECHOCARDIOGRAM (TEE);  Surgeon: Francyne Headland, MD;  Location: [facility] ENDOSCOPY;  Service: Cardiovascular;  Laterality: N/A;   TEE WITHOUT CARDIOVERSION N/A 09/03/2021   Procedure: TRANSESOPHAGEAL ECHOCARDIOGRAM (TEE);  Surgeon: Raford Riggs, MD;  Location: [facility] ENDOSCOPY;  Service: Cardiovascular;  Laterality: N/A;   TEE WITHOUT CARDIOVERSION N/A 07/15/2022   Procedure: TRANSESOPHAGEAL ECHOCARDIOGRAM;  Surgeon: Francyne Headland, MD;  [facility];  Service: Cardiovascular;  Laterality: N/A;     A IV Location/Drains/Wounds Patient Lines/Drains/Airways Status     Active Line/Drains/Airways     Name Placement date Placement time Site Days   Peripheral IV 04/09/23 18 G Left Antecubital 04/09/23  1103  Antecubital  less than 1   Wound / Incision (Open or Dehisced) (IAD) Incontinence Associated Dermatitis Buttocks Right;Left --  --  Buttocks  --            Intake/Output Last 24 hours No intake or output data in the 24 hours ending 04/09/23 1443  Labs/Imaging Results for orders placed or performed during the hospital encounter of 04/09/23 (from the past 48 hours)  Urinalysis, Routine w reflex microscopic -Urine, Clean Catch     Status: Abnormal   Collection Time: 04/09/23 10:49 AM  Result Value Ref Range   Color, Urine STRAW (A) YELLOW   APPearance CLEAR CLEAR   Specific Gravity, Urine 1.007 1.005 - 1.030   pH 6.0 5.0 - 8.0   Glucose, UA >=500 (A) NEGATIVE mg/dL   Hgb urine dipstick SMALL (A) NEGATIVE   Bilirubin Urine NEGATIVE NEGATIVE   Ketones, ur NEGATIVE NEGATIVE mg/dL    Protein, ur 899 (A) NEGATIVE mg/dL   Nitrite NEGATIVE NEGATIVE   Leukocytes,Ua NEGATIVE NEGATIVE   RBC / HPF 0-5 0 - 5 RBC/hpf   WBC, UA 0-5 0 - 5 WBC/hpf   Bacteria, UA NONE SEEN NONE SEEN   Squamous Epithelial / HPF 0-5 0 - 5 /HPF    Comment: Performed at [facility], [address], [place], [place] [address]  Basic metabolic panel     Status: Abnormal   Collection Time: 04/09/23 11:01 AM  Result Value Ref Range   Sodium 134 (L) 135 - 145 mmol/L   Potassium 3.8 3.5 - 5.1 mmol/L   Chloride 102 98 - 111 mmol/L   CO2 22 22 - 32 mmol/L   Glucose, Bld 141 (H) 70 - 99 mg/dL    Comment: Glucose reference range applies only to samples taken after fasting for at least 8 hours.   BUN 26 (H) 8 - 23 mg/dL   Creatinine, Ser 8.57 (H) 0.61 - 1.24 mg/dL   Calcium  9.4 8.9 - 10.3 mg/dL   GFR, Estimated 56 (L) >60 mL/min    Comment: (NOTE) Calculated using the CKD-EPI Creatinine Equation (2021)    Anion gap 10 5 - 15    Comment: Performed at  [facility], [address] [place], [place] [address]  CBC with Differential     Status: Abnormal   Collection Time: 04/09/23 11:01 AM  Result Value Ref Range   WBC 12.3 (H) 4.0 - 10.5 K/uL   RBC 4.07 (L) 4.22 - 5.81 MIL/uL   Hemoglobin 13.3 13.0 - 17.0 g/dL   HCT 61.6 (L) 60.9 - 47.9 %   MCV 94.1 80.0 - 100.0 fL   MCH 32.7 26.0 - 34.0 pg   MCHC 34.7 30.0 - 36.0 g/dL   RDW 87.0 88.4 - 84.4 %   Platelets 194 150 - 400 K/uL   nRBC 0.0 0.0 - 0.2 %   Neutrophils Relative % 79 %   Neutro Abs 9.8 (H) 1.7 - 7.7 K/uL   Lymphocytes Relative 8 %   Lymphs Abs 1.0 0.7 - 4.0 K/uL   Monocytes Relative 10 %   Monocytes Absolute 1.2 (H) 0.1 - 1.0 K/uL   Eosinophils Relative 2 %   Eosinophils Absolute 0.2 0.0 - 0.5 K/uL   Basophils Relative 1 %   Basophils Absolute 0.1 0.0 - 0.1 K/uL   Immature Granulocytes 0 %   Abs Immature Granulocytes 0.05 0.00 - 0.07 K/uL    Comment: Performed at [facility], [address] [place], [place]  [address]  Brain natriuretic peptide     Status: Abnormal   Collection Time: 04/09/23 11:01 AM  Result Value Ref Range   B Natriuretic Peptide 419.7 (H) 0.0 - 100.0 pg/mL    Comment: Performed at [facility], [address], [place], [place] [address]  Troponin I (High Sensitivity)     Status: Abnormal   Collection Time: 04/09/23 11:01 AM  Result Value Ref Range   Troponin I (High Sensitivity) 210 (HH) <18 ng/L    Comment: CRITICAL RESULT CALLED TO, READ BACK BY AND VERIFIED WITH CANDIE Mt RN , @1144 , 04/09/23, [place], T. (NOTE) Elevated high sensitivity troponin I (hsTnI) values and significant  changes across serial measurements may suggest ACS but many other  chronic and acute conditions are known to elevate hsTnI results.  Refer to the Links section for chest pain algorithms and additional  guidance. Performed at [facility], [address], [place], [place] [address]   Hepatic function panel     Status: Abnormal   Collection Time: 04/09/23 11:01 AM  Result Value Ref Range   Total Protein 6.6 6.5 - 8.1 g/dL   Albumin  3.5 3.5 - 5.0 g/dL   AST 16 15 - 41 U/L   ALT 15 0 - 44 U/L   Alkaline Phosphatase 75 38 - 126 U/L   Total Bilirubin 1.5 (H) 0.0 - 1.2 mg/dL   Bilirubin, Direct 0.2 0.0 - 0.2 mg/dL   Indirect Bilirubin 1.3 (H) 0.3 - 0.9 mg/dL    Comment: Performed at [facility], [address] [place], [place] [address]  Digoxin  level     Status: Abnormal   Collection Time: 04/09/23 11:01 AM  Result Value Ref Range   Digoxin  Level 0.2 (L) 0.8 - 2.0 ng/mL    Comment: Performed at [facility], [address], [place], [place] [address]  Resp panel by RT-PCR (RSV, Flu A&B, Covid) Anterior Nasal Swab     Status: None   Collection Time: 04/09/23 11:56 AM   Specimen: Anterior Nasal Swab  Result Value Ref Range   SARS Coronavirus 2 by RT PCR NEGATIVE NEGATIVE   Influenza  A by PCR NEGATIVE NEGATIVE   Influenza B by PCR NEGATIVE NEGATIVE    Comment:  (NOTE) The Xpert Xpress SARS-CoV-2/FLU/RSV plus assay is intended as an aid in the diagnosis of influenza from Nasopharyngeal swab specimens and should not be used as a sole basis for treatment. Nasal washings and aspirates are unacceptable for Xpert Xpress SARS-CoV-2/FLU/RSV testing.  Fact Sheet for Patients: bloggercourse.com  Fact Sheet for Healthcare Providers: seriousbroker.it  This test is not yet approved or cleared by the [place]  FDA and has been authorized for detection and/or diagnosis of SARS-CoV-2 by FDA under an Emergency Use Authorization (EUA). This EUA will remain in effect (meaning this test can be used) for the duration of the COVID-19 declaration under Section 564(b)(1) of the Act, 21 U.S.C. section 360bbb-3(b)(1), unless the authorization is terminated or revoked.     Resp Syncytial Virus by PCR NEGATIVE NEGATIVE    Comment: (NOTE) Fact Sheet for Patients: bloggercourse.com  Fact Sheet for Healthcare Providers: seriousbroker.it  This test is not yet approved or cleared by the [place]  FDA and has been authorized for detection and/or diagnosis of SARS-CoV-2 by FDA under an Emergency Use Authorization (EUA). This EUA will remain in effect (meaning this test can be used) for the duration of the COVID-19 declaration under Section 564(b)(1) of the Act, 21 U.S.C. section 360bbb-3(b)(1), unless the authorization is terminated or revoked.  Performed at [facility], [address], [place], [place] [address]   Troponin I (High Sensitivity)     Status: Abnormal   Collection Time: 04/09/23  1:14 PM  Result Value Ref Range   Troponin I (High Sensitivity) 259 (HH) <18 ng/L    Comment: CRITICAL VALUE NOTED. VALUE IS CONSISTENT WITH PREVIOUSLY REPORTED/CALLED VALUE (NOTE) Elevated high sensitivity troponin I (hsTnI) values and significant  changes  across serial measurements may suggest ACS but many other  chronic and acute conditions are known to elevate hsTnI results.  Refer to the Links section for chest pain algorithms and additional  guidance. Performed at [facility], [address], [place], [place] [address]    DG Chest Port 1 View Result Date: 04/09/2023 CLINICAL DATA:  Shortness of breath. EXAM: PORTABLE CHEST 1 VIEW COMPARISON:  02/20/2023 FINDINGS: Stable cardiomediastinal contours. Pulmonary vascular congestion. Small right pleural effusion. No airspace opacities. Postoperative changes noted within the thoracic spine from previous posterior hardware fixation. IMPRESSION: Pulmonary vascular congestion and small right pleural effusion. Electronically Signed   By: Waddell Calk M.D.   On: 04/09/2023 11:20    Pending Labs Unresulted Labs (From admission, onward)     Start     Ordered   04/09/23 1409  HIV Antibody (routine testing w rflx)  (HIV Antibody (Routine testing w reflex) panel)  Once,   R        04/09/23 1438            Vitals/Pain Today's Vitals   04/09/23 1052 04/09/23 1054 04/09/23 1100 04/09/23 1242  BP: (!) 160/108     Pulse: 89     Resp: 18     Temp: 99.7 F (37.6 C)     TempSrc: Oral     SpO2: 100%     Weight:  79.4 kg    Height:  6' (1.829 m)    PainSc:   8  4     Isolation Precautions No active isolations  Medications Medications  furosemide  (LASIX ) injection 40 mg (40 mg Intravenous Given 04/09/23 1103)  morphine  (  PF) 4 MG/ML injection 4 mg (4 mg Intravenous Given 04/09/23 1206)  ondansetron  (ZOFRAN ) injection 4 mg (4 mg Intravenous Given 04/09/23 1205)    Mobility walks with device     Focused Assessments    R Recommendations: See Admitting Provider Note  Report given to:   Additional Notes:

## 2023-04-09 NOTE — H&P (Addendum)
 Date: 04/09/2023               Patient Name:  David [name] MRN: [id number]  DOB: [birth year] Age / Sex: 63 y.o., male   PCP: Delbert Clam, MD         Medical Service: Internal Medicine Teaching Service         Attending Physician: Dr. Rosan Dayton BROCKS, DO      First Contact: Dr. Ozell Riff, MD Pager [phone]    Second Contact: Dr. 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   He states that for the over the past few days he started to feel progressively short of breath, which has never happened to him before.  He recently started taking a long-acting pain medication called Xtampza  a few days ago, so he thought this may be a side effect of the medication. He denies any recent cough, congestion, fevers, nausea/vomiting, or changes in his diet.  He has a history of heart failure but states he has never been hospitalized with an exacerbation.    Of note, he did miss several days of his digoxin  this past week through Friday, but restarted it yesterday.  He has not missed any doses of his other medications.  He has orthopnea at baseline and propped himself up while he sleeps, but has not noticed any recent changes.  He does note mildly increased abdominal swelling, but denies any lower extremity swelling.  He denies any history of lung disease.   ED Course: O2 sats in the 80s on RA with EMS, started on NRB. Some congestion on CXR. Diuresed with 40 mg IV Lasix . Transitioned to 4L [place]. Troponin 210 --> 259. BNP 419. Mild leukocytosis of 12. RVP  negative. UA unremarkable.   Meds:  Current Meds  Medication Sig   apixaban  (ELIQUIS ) 5 MG TABS tablet Take 1 tablet (5 mg total) by mouth 2 (two) times daily.   carvedilol  (COREG ) 3.125 MG tablet Take 1 tablet (3.125 mg total) by mouth 2 (two) times daily.   dapagliflozin  propanediol (FARXIGA ) 10 MG TABS tablet Take 1 tablet (10 mg total) by mouth daily before breakfast.   digoxin  (LANOXIN ) 0.125 MG tablet Take 1 tablet (125 mcg total) by mouth once daily.   doxycycline  (VIBRA -TABS) 100 MG tablet Take 1 tablet (100 mg total) by mouth every 12 (twelve) hours.   famotidine  (PEPCID ) 20 MG tablet Take 1 tablet (20 mg total) by mouth 2 times daily.   fluticasone  (FLONASE ) 50 MCG/ACT nasal spray Place 2 sprays into both nostrils daily.   furosemide  (LASIX ) 40 MG tablet Take 1/2 tablet (20 mg total) by mouth daily.   HYDROcodone -acetaminophen  (NORCO) 10-325 MG tablet Take 1 tablet by mouth every 4 (four) hours as needed for pain.   insulin  glargine (LANTUS  SOLOSTAR) 100 UNIT/ML Solostar Pen Inject 27 Units into the skin daily.   methocarbamol  (ROBAXIN -750) 750 MG tablet Take 1 tablet (750 mg total) by mouth 2 (two) times daily as needed for muscle spasms.  pregabalin  (LYRICA ) 75 MG capsule Take 1 capsule (75 mg total) by mouth at bedtime.   rosuvastatin  (CRESTOR ) 20 MG tablet Take 1 tablet (20 mg total) by mouth daily.   sacubitril -valsartan  (ENTRESTO ) 49-51 MG Take 1 tablet by mouth 2 (two) times daily.   tamsulosin  (FLOMAX ) 0.4 MG CAPS capsule Take 1 capsule (0.4 mg total) by mouth daily after supper.   terbinafine  (LAMISIL ) 250 MG tablet Take 1 tablet (250 mg total) by mouth daily.   XTAMPZA  ER 9 MG C12A Take 1 capsule by mouth every 12 (twelve) hours.   Past Medical History  Past Surgical History:  Procedure Laterality Date   ABDOMINAL AORTOGRAM W/LOWER EXTREMITY N/A 09/02/2021   Procedure: ABDOMINAL AORTOGRAM W/LOWER EXTREMITY;  Surgeon: Serene Gaile ORN, MD;  Location: [facility];   Service: Cardiovascular;  Laterality: N/A;   ABDOMINAL AORTOGRAM W/LOWER EXTREMITY N/A 12/28/2021   Procedure: ABDOMINAL AORTOGRAM W/LOWER EXTREMITY;  Surgeon: Serene Gaile ORN, MD;  Location: [facility];  Service: Cardiovascular;  Laterality: N/A;   ABDOMINAL AORTOGRAM W/LOWER EXTREMITY N/A 07/28/2022   Procedure: ABDOMINAL AORTOGRAM W/LOWER EXTREMITY;  Surgeon: Gretta Lonni PARAS, MD;  Location: [facility];  Service: Cardiovascular;  Laterality: N/A;   AMPUTATION Right 09/04/2021   Procedure: 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;  Surgeon: Harden Jerona GAILS, MD;  Location: [facility] OR;  Service: Orthopedics;  Laterality: Right;   AMPUTATION Right 08/02/2022   Procedure: AMPUTATION RAY 5TH METATARSAL OF RIGHT FOOT;  Surgeon: Barton Drape, MD;  Location: [facility] OR;  Service: Orthopedics;  Laterality: Right;   APPENDECTOMY  1971   BONE BIOPSY Right 08/02/2022   Procedure: BONE BIOPSY OF 5TH METATARSAL RIGHT FOOT;  Surgeon: Barton Drape, MD;  Location: [facility] OR;  Service: Orthopedics;  Laterality: Right;   CARDIOVERSION N/A 09/28/2017   Procedure: CARDIOVERSION;  Surgeon: Francyne Headland, MD;  Location: [facility];  Service: Cardiovascular;  Laterality: N/A;   IRRIGATION AND DEBRIDEMENT FOOT  08/02/2022   Procedure: IRRIGATION AND DEBRIDEMENT RIGHT 5TH METATARSAL FOOT;  Surgeon: Barton Drape, MD;  Location: [facility] OR;  Service: Orthopedics;;   LAMINECTOMY WITH POSTERIOR LATERAL ARTHRODESIS LEVEL 4 N/A 08/30/2022   Procedure: THORACIC FIVE-THORACIC ELEVEN FUSION, THORACIC EIGHT TRANSPEDICULAR DECOMPRESSION AND PARTIAL CORPECTOMY with O-Arm;  Surgeon: Debby Dorn MATSU, MD;  Location: [facility] OR;  Service: Neurosurgery;  Laterality: N/A;   PERIPHERAL VASCULAR BALLOON ANGIOPLASTY  09/02/2021   Procedure: PERIPHERAL VASCULAR BALLOON ANGIOPLASTY;  Surgeon: Serene Gaile ORN, MD;  Location: [facility];  Service: Cardiovascular;;   PERIPHERAL VASCULAR BALLOON ANGIOPLASTY  12/28/2021   Procedure: PERIPHERAL  VASCULAR BALLOON ANGIOPLASTY;  Surgeon: Serene Gaile ORN, MD;  Location: [facility];  Service: Cardiovascular;;  Posterior Tibial PTA only   TEE WITHOUT CARDIOVERSION N/A 09/28/2017   Procedure: TRANSESOPHAGEAL ECHOCARDIOGRAM (TEE);  Surgeon: Francyne Headland, MD;  Location: [facility] ENDOSCOPY;  Service: Cardiovascular;  Laterality: N/A;   TEE WITHOUT CARDIOVERSION N/A 09/03/2021   Procedure: TRANSESOPHAGEAL ECHOCARDIOGRAM (TEE);  Surgeon: Raford Riggs, MD;  Location: [facility] ENDOSCOPY;  Service: Cardiovascular;  Laterality: N/A;   TEE WITHOUT CARDIOVERSION N/A 07/15/2022   Procedure: TRANSESOPHAGEAL ECHOCARDIOGRAM;  Surgeon: Francyne Headland, MD;  Location: [facility];  Service: Cardiovascular;  Laterality: N/A;   Social:  Lives With: wife and cousin Level of Function: independent; mobile with a cane for backup following his spine surgery PCP: Delbert Clam, MD Substances: Occasional social alcohol. No other drug use.   Family History:  Family History  Problem Relation Age of  Onset   Hypertension Mother    Allergies: Allergies as of 04/09/2023   (No Known Allergies)   Review of Systems: A complete ROS was negative except as per HPI.   OBJECTIVE:   Physical Exam: Blood pressure 99/85, pulse (!) 119, temperature 98.6 F (37 C), temperature source Oral, resp. rate 20, height 6' (1.829 m), weight 76.6 kg, SpO2 99%.  Constitutional: well-appearing, sitting up in bed, satting well on 4L [place] HENT: mucous membranes moist Cardiovascular: atrial fibrillation, normal rate, no murmurs; JVD present Pulmonary/Chest: normal respiratory rate and effort, lungs clear on auscultation Abdominal: soft, non-tender; mild abdominal edema MSK: normal bulk and tone Neurological: alert & oriented x 3, no focal neuro deficits Skin: warm and dry  Labs: CBC    Component Value Date/Time   WBC 12.3 (H) 04/09/2023 1101   RBC 4.07 (L) 04/09/2023 1101   HGB 13.3 04/09/2023 1101   HGB 17.3  06/16/2021 1658   HCT 38.3 (L) 04/09/2023 1101   HCT 47.0 06/16/2021 1658   PLT 194 04/09/2023 1101   PLT 244 06/16/2021 1658   MCV 94.1 04/09/2023 1101   MCV 91 06/16/2021 1658   MCH 32.7 04/09/2023 1101   MCHC 34.7 04/09/2023 1101   RDW 12.9 04/09/2023 1101   RDW 12.7 06/16/2021 1658   LYMPHSABS 1.0 04/09/2023 1101   LYMPHSABS 1.7 11/16/2018 1128   MONOABS 1.2 (H) 04/09/2023 1101   EOSABS 0.2 04/09/2023 1101   EOSABS 0.4 11/16/2018 1128   BASOSABS 0.1 04/09/2023 1101   BASOSABS 0.1 11/16/2018 1128    CMP     Component Value Date/Time   NA 134 (L) 04/09/2023 1101   NA 136 01/09/2023 0841   K 3.8 04/09/2023 1101   CL 102 04/09/2023 1101   CO2 22 04/09/2023 1101   GLUCOSE 141 (H) 04/09/2023 1101   BUN 26 (H) 04/09/2023 1101   BUN 21 01/09/2023 0841   CREATININE 1.42 (H) 04/09/2023 1101   CREATININE 1.26 12/12/2022 1624   CALCIUM  9.4 04/09/2023 1101   PROT 6.6 04/09/2023 1101   PROT 6.8 01/09/2023 0841   ALBUMIN  3.5 04/09/2023 1101   ALBUMIN  4.2 01/09/2023 0841   AST 16 04/09/2023 1101   ALT 15 04/09/2023 1101   ALKPHOS 75 04/09/2023 1101   BILITOT 1.5 (H) 04/09/2023 1101   BILITOT 1.4 (H) 01/09/2023 0841   GFRNONAA 56 (L) 04/09/2023 1101   GFRAA >60 08/30/2019 0445   Imaging: DG Chest Port 1 View CLINICAL DATA:  Shortness of breath.  EXAM: PORTABLE CHEST 1 VIEW  COMPARISON:  02/20/2023  FINDINGS: Stable cardiomediastinal contours. Pulmonary vascular congestion. Small right pleural effusion. No airspace opacities. Postoperative changes noted within the thoracic spine from previous posterior hardware fixation.  IMPRESSION: Pulmonary vascular congestion and small right pleural effusion.  Electronically Signed   By: Waddell Calk M.D.   On: 04/09/2023 11:20  EKG: personally reviewed my interpretation is atrial fibrillation, normal rate  ASSESSMENT & PLAN:   Assessment & Plan by Problem: Principal Problem:   Acute systolic heart failure  (HCC) Active Problems:   Type 2 diabetes mellitus (HCC)   Atrial fibrillation (HCC)   Acute hypoxic respiratory failure (HCC)   Chronic pain syndrome   Elevated troponin   MRSA infection greater than 3 months ago  MISTER KRAHENBUHL is a 63 y.o. 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 hospital day 0  Acute systolic heart failure exacerbation Acute hypoxic respiratory failure HFmrEF (EF 45%) He has a known history of heart failure but has never been hospitalized for an exacerbation.  He had vascular congestion on chest x-ray and responded well to diuresis with around 2.5 L of urine output after receiving 40 mg IV Lasix .  On exam, he had signs of volume overload with JVD and mild abdominal edema, but was much improved from a respiratory standpoint.  I suspect this exacerbation occurred due to missing several doses of digoxin .  We will continue strict I's and O's and will give him another dose of Lasix  in the morning with oral potassium. We will also recheck a BMP and magnesium  in the morning. Restarted his home digoxin  and Entresto , but holding coreg  in the setting of his acute exacerbation. PT/OT eval pending.   Atrial fibrillation on eliquis  He has known Afib and is on eliquis  5 mg BID at home. His HR is normal and he is hemodynamically stable. We have continued his eliquis .   Elevated troponin Troponin elevated on presentation at 210 --> 259, thought to be due to increased cardiac demand. He does have risk factors for coronary artery disease, but denies any chest pain, so we have a low suspicion for ACS. Will continue to trend troponins until peak.   HTN BP was elevated on admission, but has now normalized. He thinks the elevation on admission was due to anxiety. He takes his BP at home and says  it usually runs in the 120s/80s. No medication changes.   Type 2 diabetes mellitus Complicated by neuropathy and a prior amputation of his 5th toe. Most recent hemoglobin A1c was 7.1, goal <7. He is currently taking Lantus  27 units daily and Farxiga  10 mg daily. We will continue long-acting insulin  and will add SSI, checking CBGs.   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 Recently started Xtampza , which he feels controls his pain well. We will manage his pain with oxycodone  ER 10 mg q12h while he is hospitalized, in addition to restarting his robaxin  and lyrica  at his home doses.   History of MRSA bacteremia He was previously admitted for MRSA bacteremia complicated by osteomyelitis.  He is taking doxycycline  100 mg twice daily for chronic infection suppression, which we have restarted.  GERD On famotidine  20 mg BID at home, continued.   Diet: Regular VTE: Eliquis  IVF: None Code: Full  Prior to Admission Living Arrangement: Home, living with wife Anticipated Discharge Location: Home Barriers to Discharge: resolution of HF exacerbation  Dispo: Admit patient to Observation with expected length of stay less than 2 midnights.  Signed: Ozell Riff, MD Internal Medicine Resident, PGY-1 [facility]  Pager: [phone]  04/09/2023, 5:03 PM

## 2023-04-09 NOTE — Progress Notes (Signed)
 Pt on 4L [place] and is VSS at this time. Will hold off BiPAP for now, on standby. MD aware.

## 2023-04-09 NOTE — ED Provider Notes (Signed)
 [place] EMERGENCY DEPARTMENT AT [facility] Provider Note   CSN: [id number] Arrival date & time: 04/09/23  1044     History  Chief Complaint  Patient presents with   Shortness of Breath    David [name] is a 63 y.o. male.  Pt is a 63 yo male with pmhx significant for chronic pain, htn, cardiomyopathy with EF 45%, sleep apnea, and DM2. Pt was recently put on Xtampza  Er 9 Mg (started on 12/28).  He feels like that medication is causing his sx.  No fevers.  EMS report O2 sats in the 80s on RA when they arrived.  They did put him on a NRB.       Home Medications Prior to Admission medications   Medication Sig Start Date End Date Taking? Authorizing Provider  Accu-Chek Softclix Lancets lancets Use to check blood sugar 3 times daily. 11/15/22   Newlin, Enobong, MD  apixaban  (ELIQUIS ) 5 MG TABS tablet Take 1 tablet (5 mg total) by mouth 2 (two) times daily. 10/31/22   Waldron Pac, FNP  bisacodyl  (DULCOLAX) 10 MG suppository Place 1 suppository (10 mg total) rectally daily as needed for moderate constipation. 10/21/22   Akula, Vijaya, MD  Blood Glucose Monitoring Suppl (ACCU-CHEK GUIDE) w/Device KIT Use to check blood sugar 3 times daily. E11.9 11/15/22   Newlin, Enobong, MD  carvedilol  (COREG ) 3.125 MG tablet Take 1 tablet (3.125 mg total) by mouth 2 (two) times daily. 03/01/23   Newlin, Enobong, MD  Continuous Glucose Receiver (DEXCOM G7 RECEIVER) DEVI Use as directed. 03/10/23   Newlin, Enobong, MD  Continuous Glucose Sensor (DEXCOM G7 SENSOR) MISC Use as directed & change every 10 days. 03/10/23   Newlin, Enobong, MD  dapagliflozin  propanediol (FARXIGA ) 10 MG TABS tablet Take 1 tablet (10 mg total) by mouth daily before breakfast. 03/06/23   Newlin, Enobong, MD  digoxin  (LANOXIN ) 0.125 MG tablet Take 1 tablet (125 mcg total) by mouth once daily. 10/28/22   Jillian Buttery, MD  doxycycline  (VIBRA -TABS) 100 MG tablet Take 1 tablet (100 mg total) by mouth every 12 (twelve) hours.  03/20/23   Dennise Kingsley, MD  famotidine  (PEPCID ) 20 MG tablet Take 1 tablet (20 mg total) by mouth 2 times daily. 10/28/22   Jillian Buttery, MD  finasteride  (PROSCAR ) 5 MG tablet Take 1 tablet (5 mg total) by mouth daily. 10/28/22   Jillian Buttery, MD  fluticasone  (FLONASE ) 50 MCG/ACT nasal spray Place 2 sprays into both nostrils daily. 10/21/22   Akula, Vijaya, MD  furosemide  (LASIX ) 40 MG tablet Take 1/2 tablet (20 mg total) by mouth daily. 10/28/22   Jillian Buttery, MD  glucose blood (ACCU-CHEK GUIDE) test strip Use to check blood sugar 3 times daily. 11/15/22   Newlin, Enobong, MD  HYDROcodone -acetaminophen  (NORCO) 10-325 MG tablet Take 1 tablet by mouth every 4 (four) hours as needed for pain. 12/30/22     insulin  glargine (LANTUS  SOLOSTAR) 100 UNIT/ML Solostar Pen Inject 27 Units into the skin daily. 03/01/23   Newlin, Enobong, MD  Insulin  Pen Needle (PEN NEEDLES) 32G X 4 MM MISC Use to inject insulin  once daily. 11/15/22   Newlin, Enobong, MD  lidocaine  (LIDODERM ) 5 % Place 1 patch onto the skin daily. Remove & Discard patch within 12 hours or as directed by MD 02/21/23   Silver Wonda DELENA, PA  liver oil-zinc  oxide (DESITIN) 40 % ointment Apply topically 2 (two) times daily. 10/21/22   Akula, Vijaya, MD  methocarbamol  (ROBAXIN -750) 750 MG tablet Take  1 tablet (750 mg total) by mouth 2 (two) times daily as needed for muscle spasms. 03/31/23   Vicci Barnie NOVAK, MD  Misc. 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 03/12/23   Long, Joshua G, MD  polyethylene  glycol powder (GLYCOLAX /MIRALAX ) 17 GM/SCOOP powder Take 17 g by mouth daily. 12/29/22   Newlin, Enobong, MD  pregabalin  (LYRICA ) 75 MG capsule Take 1 capsule (75 mg total) by mouth at bedtime. 03/01/23   Newlin, Enobong, MD  rosuvastatin  (CRESTOR ) 20 MG tablet Take 1 tablet (20 mg total) by mouth daily. 01/10/23   Newlin, Enobong, MD  sacubitril -valsartan  (ENTRESTO ) 49-51 MG Take 1 tablet by mouth 2 (two) times daily. 10/28/22   Jillian Buttery, MD  senna-docusate (SENOKOT-S) 8.6-50 MG tablet Take 1 tablet by mouth 2 (two) times daily. 10/28/22   Jillian Buttery, MD  tamsulosin  (FLOMAX ) 0.4 MG CAPS capsule Take 1 capsule (0.4 mg total) by mouth daily after supper. 03/01/23   Newlin, Enobong, MD  terbinafine  (LAMISIL ) 250 MG tablet Take 1 tablet (250 mg total) by mouth daily. 03/01/23   Newlin, Enobong, MD      Allergies    Patient has no known allergies.    Review of Systems   Review of Systems  Respiratory:  Positive for shortness of breath.   All other systems reviewed and are negative.   Physical Exam Updated Vital Signs BP (!) 160/108 (BP Location: Right Arm)   Pulse 89   Temp 99.7 F (37.6 C) (Oral)   Resp 18   Ht 6' (1.829 m)   Wt 79.4 kg   SpO2 100%   BMI 23.73 kg/m  Physical Exam Vitals and nursing note reviewed.  Constitutional:      Appearance: He is well-developed.  HENT:     Head: Normocephalic and atraumatic.     Mouth/Throat:     Mouth: Mucous membranes are moist.     Pharynx: Oropharynx is clear.  Eyes:     Extraocular Movements: Extraocular movements intact.     Pupils: Pupils are equal, round, and reactive to light.  Cardiovascular:     Rate and Rhythm: Normal rate. Rhythm irregular.  Pulmonary:     Effort: Tachypnea present.     Breath sounds: Rhonchi present.  Abdominal:     General: Bowel sounds are normal.     Palpations: Abdomen is soft.  Musculoskeletal:        General: Normal range of motion.     Cervical back: Normal range of motion and neck  supple.  Skin:    General: Skin is warm.     Capillary Refill: Capillary refill takes less than 2 seconds.  Neurological:     General: No focal deficit present.     Mental Status: He is alert and oriented to person, place, and time.  Psychiatric:        Mood and Affect: Mood normal.        Behavior: Behavior normal.     ED Results / Procedures / Treatments   Labs (all  labs ordered are listed, but only abnormal results are displayed) Labs Reviewed  BASIC METABOLIC PANEL - Abnormal; Notable for the following components:      Result Value   Sodium 134 (*)    Glucose, Bld 141 (*)    BUN 26 (*)    Creatinine, Ser 1.42 (*)    GFR, Estimated 56 (*)    All other components within normal limits  CBC WITH DIFFERENTIAL/PLATELET - Abnormal; Notable for the following components:   WBC 12.3 (*)    RBC 4.07 (*)    HCT 38.3 (*)    Neutro Abs 9.8 (*)    Monocytes Absolute 1.2 (*)    All other components within normal limits  BRAIN NATRIURETIC PEPTIDE - Abnormal; Notable for the following components:   B Natriuretic Peptide 419.7 (*)    All other components within normal limits  URINALYSIS, ROUTINE W REFLEX MICROSCOPIC - Abnormal; Notable for the following components:   Color, Urine STRAW (*)    Glucose, UA >=500 (*)    Hgb urine dipstick SMALL (*)    Protein, ur 100 (*)    All other components within normal limits  HEPATIC FUNCTION PANEL - Abnormal; Notable for the following components:   Total Bilirubin 1.5 (*)    Indirect Bilirubin 1.3 (*)    All other components within normal limits  DIGOXIN  LEVEL - Abnormal; Notable for the following components:   Digoxin  Level 0.2 (*)    All other components within normal limits  TROPONIN I (HIGH SENSITIVITY) - Abnormal; Notable for the following components:   Troponin I (High Sensitivity) 210 (*)    All other components within normal limits  RESP PANEL BY RT-PCR (RSV, FLU A&B, COVID)  [id number]  TROPONIN I (HIGH SENSITIVITY)    EKG EKG  Interpretation Date/Time:  Sunday April 09 2023 11:39:23 EST Ventricular Rate:  81 PR Interval:    QRS Duration:  104 QT Interval:  433 QTC Calculation: 503 R Axis:   -36  Text Interpretation: Atrial fibrillation Ventricular premature complex Left axis deviation Probable posterior infarct, recent Abnrm T, consider ischemia, anterolateral lds Prolonged QT interval No significant change since last tracing Confirmed by Dean Clarity [phone]) on 04/09/2023 11:43:50 AM  Radiology DG Chest Port 1 View Result Date: 04/09/2023 CLINICAL DATA:  Shortness of breath. EXAM: PORTABLE CHEST 1 VIEW COMPARISON:  02/20/2023 FINDINGS: Stable cardiomediastinal contours. Pulmonary vascular congestion. Small right pleural effusion. No airspace opacities. Postoperative changes noted within the thoracic spine from previous posterior hardware fixation. IMPRESSION: Pulmonary vascular congestion and small right pleural effusion. Electronically Signed   By: Waddell Calk M.D.   On: 04/09/2023 11:20    Procedures Procedures    Medications Ordered in ED Medications  furosemide  (LASIX ) injection 40 mg (40 mg Intravenous Given 04/09/23 1103)  morphine  (PF) 4 MG/ML injection 4 mg (4 mg Intravenous Given 04/09/23 1206)  ondansetron  (ZOFRAN ) injection 4 mg (4 mg Intravenous Given 04/09/23 1205)    ED Course/ Medical Decision Making/ A&P                                 Medical Decision Making Amount and/or Complexity of Data Reviewed Labs: ordered. Radiology: ordered.  Risk Prescription drug management. Decision regarding hospitalization.   This patient presents to the ED for concern of sob, this involves an extensive number of treatment options, and is a complaint that carries with it a high risk of complications  and morbidity.  The differential diagnosis includes chf, copd, pe   Co morbidities that complicate the patient evaluation  chronic pain, htn, cardiomyopathy with EF 45%, sleep apnea, and  DM2   Additional history obtained:  Additional history obtained from epic chart review External records from outside source obtained and reviewed including EMS report   Lab Tests:  I Ordered, and personally interpreted labs.  The pertinent results include:  cbc with hgt 12.3, bmp with cr sl elevated at 1.42 (1.28 in October), bnp sl elevated at 419.7, lfts nl, trop elevated at 210, covid/flu/rsv neg   Imaging Studies ordered:  I ordered imaging studies including cxr  I independently visualized and interpreted imaging which showed Pulmonary vascular congestion and small right pleural effusion.  I agree with the radiologist interpretation   Cardiac Monitoring:  The patient was maintained on a cardiac monitor.  I personally viewed and interpreted the cardiac monitored which showed an underlying rhythm of: afib   Medicines ordered and prescription drug management:  I ordered medication including lasix   for sx  Reevaluation of the patient after these medicines showed that the patient improved I have reviewed the patients home medicines and have made adjustments as needed   Test Considered:  ct   Critical Interventions:  oxygen   Consultations Obtained:  I requested consultation with the IMTS,  and discussed lab and imaging findings as well as pertinent plan - they will admit  Problem List / ED Course:  Resp failure:  pt transitioned to 4L oxygen and RR and HR has decreased.  Pt looks more comfortable.  Pt given 40 mg lasix  iv and has diuresed.  Sx likely due to CHF. Elevated trop:  no cp.  Likely due to wob.  Continue to trend.   Reevaluation:  After the interventions noted above, I reevaluated the patient and found that they have :improved   Social Determinants of Health:  Lives at home   Dispostion:  After consideration of the diagnostic results and the patients response to treatment, I feel that the patent would benefit from admission.  CRITICAL  CARE Performed by: Mliss Boyers   Total critical care time: 30 minutes  Critical care time was exclusive of separately billable procedures and treating other patients.  Critical care was necessary to treat or prevent imminent or life-threatening deterioration.  Critical care was time spent personally by me on the following activities: development of treatment plan with patient and/or surrogate as well as nursing, discussions with consultants, evaluation of patient's response to treatment, examination of patient, obtaining history from patient or surrogate, ordering and performing treatments and interventions, ordering and review of laboratory studies, ordering and review of radiographic studies, pulse oximetry and re-evaluation of patient's condition.           Final Clinical Impression(s) / ED Diagnoses Final diagnoses:  Acute on chronic congestive heart failure, unspecified heart failure type (HCC)  Persistent atrial fibrillation (HCC)  Acute respiratory failure with hypoxia (HCC)  Chronic bilateral low back pain, unspecified whether sciatica present  Elevated troponin    Rx / DC Orders ED Discharge Orders     None         Boyers Mliss, MD 04/09/23 1357

## 2023-04-10 ENCOUNTER — Encounter (HOSPITAL_COMMUNITY): Payer: Self-pay | Admitting: Internal Medicine

## 2023-04-10 DIAGNOSIS — Z87891 Personal history of nicotine dependence: Secondary | ICD-10-CM | POA: Diagnosis not present

## 2023-04-10 DIAGNOSIS — E7800 Pure hypercholesterolemia, unspecified: Secondary | ICD-10-CM | POA: Diagnosis present

## 2023-04-10 DIAGNOSIS — I509 Heart failure, unspecified: Secondary | ICD-10-CM

## 2023-04-10 DIAGNOSIS — I5031 Acute diastolic (congestive) heart failure: Secondary | ICD-10-CM | POA: Diagnosis not present

## 2023-04-10 DIAGNOSIS — J9601 Acute respiratory failure with hypoxia: Secondary | ICD-10-CM | POA: Diagnosis present

## 2023-04-10 DIAGNOSIS — E1122 Type 2 diabetes mellitus with diabetic chronic kidney disease: Secondary | ICD-10-CM | POA: Diagnosis present

## 2023-04-10 DIAGNOSIS — M545 Low back pain, unspecified: Secondary | ICD-10-CM | POA: Diagnosis not present

## 2023-04-10 DIAGNOSIS — Z01810 Encounter for preprocedural cardiovascular examination: Secondary | ICD-10-CM | POA: Diagnosis not present

## 2023-04-10 DIAGNOSIS — K567 Ileus, unspecified: Secondary | ICD-10-CM | POA: Diagnosis not present

## 2023-04-10 DIAGNOSIS — E1140 Type 2 diabetes mellitus with diabetic neuropathy, unspecified: Secondary | ICD-10-CM | POA: Diagnosis present

## 2023-04-10 DIAGNOSIS — I10 Essential (primary) hypertension: Secondary | ICD-10-CM | POA: Diagnosis not present

## 2023-04-10 DIAGNOSIS — Z1152 Encounter for screening for COVID-19: Secondary | ICD-10-CM | POA: Diagnosis not present

## 2023-04-10 DIAGNOSIS — E1151 Type 2 diabetes mellitus with diabetic peripheral angiopathy without gangrene: Secondary | ICD-10-CM | POA: Diagnosis present

## 2023-04-10 DIAGNOSIS — E872 Acidosis, unspecified: Secondary | ICD-10-CM | POA: Diagnosis present

## 2023-04-10 DIAGNOSIS — R9431 Abnormal electrocardiogram [ECG] [EKG]: Secondary | ICD-10-CM | POA: Diagnosis not present

## 2023-04-10 DIAGNOSIS — E871 Hypo-osmolality and hyponatremia: Secondary | ICD-10-CM | POA: Diagnosis present

## 2023-04-10 DIAGNOSIS — I472 Ventricular tachycardia, unspecified: Secondary | ICD-10-CM | POA: Diagnosis not present

## 2023-04-10 DIAGNOSIS — I4891 Unspecified atrial fibrillation: Secondary | ICD-10-CM | POA: Diagnosis not present

## 2023-04-10 DIAGNOSIS — R42 Dizziness and giddiness: Secondary | ICD-10-CM | POA: Diagnosis not present

## 2023-04-10 DIAGNOSIS — I429 Cardiomyopathy, unspecified: Secondary | ICD-10-CM | POA: Diagnosis present

## 2023-04-10 DIAGNOSIS — I214 Non-ST elevation (NSTEMI) myocardial infarction: Secondary | ICD-10-CM | POA: Diagnosis present

## 2023-04-10 DIAGNOSIS — I4819 Other persistent atrial fibrillation: Secondary | ICD-10-CM

## 2023-04-10 DIAGNOSIS — Z794 Long term (current) use of insulin: Secondary | ICD-10-CM | POA: Diagnosis not present

## 2023-04-10 DIAGNOSIS — I5021 Acute systolic (congestive) heart failure: Secondary | ICD-10-CM | POA: Diagnosis not present

## 2023-04-10 DIAGNOSIS — I25810 Atherosclerosis of coronary artery bypass graft(s) without angina pectoris: Secondary | ICD-10-CM | POA: Diagnosis present

## 2023-04-10 DIAGNOSIS — E1165 Type 2 diabetes mellitus with hyperglycemia: Secondary | ICD-10-CM | POA: Diagnosis present

## 2023-04-10 DIAGNOSIS — N179 Acute kidney failure, unspecified: Secondary | ICD-10-CM | POA: Diagnosis not present

## 2023-04-10 DIAGNOSIS — N170 Acute kidney failure with tubular necrosis: Secondary | ICD-10-CM | POA: Diagnosis not present

## 2023-04-10 DIAGNOSIS — D696 Thrombocytopenia, unspecified: Secondary | ICD-10-CM | POA: Diagnosis not present

## 2023-04-10 DIAGNOSIS — I5033 Acute on chronic diastolic (congestive) heart failure: Secondary | ICD-10-CM | POA: Diagnosis present

## 2023-04-10 DIAGNOSIS — G8929 Other chronic pain: Secondary | ICD-10-CM | POA: Diagnosis not present

## 2023-04-10 DIAGNOSIS — I272 Pulmonary hypertension, unspecified: Secondary | ICD-10-CM | POA: Diagnosis present

## 2023-04-10 DIAGNOSIS — E1169 Type 2 diabetes mellitus with other specified complication: Secondary | ICD-10-CM | POA: Diagnosis not present

## 2023-04-10 DIAGNOSIS — I4821 Permanent atrial fibrillation: Secondary | ICD-10-CM | POA: Diagnosis present

## 2023-04-10 DIAGNOSIS — I130 Hypertensive heart and chronic kidney disease with heart failure and stage 1 through stage 4 chronic kidney disease, or unspecified chronic kidney disease: Secondary | ICD-10-CM | POA: Diagnosis present

## 2023-04-10 DIAGNOSIS — I25700 Atherosclerosis of coronary artery bypass graft(s), unspecified, with unstable angina pectoris: Secondary | ICD-10-CM | POA: Diagnosis not present

## 2023-04-10 DIAGNOSIS — N1832 Chronic kidney disease, stage 3b: Secondary | ICD-10-CM | POA: Diagnosis present

## 2023-04-10 DIAGNOSIS — I340 Nonrheumatic mitral (valve) insufficiency: Secondary | ICD-10-CM | POA: Diagnosis present

## 2023-04-10 DIAGNOSIS — I110 Hypertensive heart disease with heart failure: Secondary | ICD-10-CM | POA: Diagnosis not present

## 2023-04-10 DIAGNOSIS — I2510 Atherosclerotic heart disease of native coronary artery without angina pectoris: Secondary | ICD-10-CM | POA: Diagnosis not present

## 2023-04-10 DIAGNOSIS — R34 Anuria and oliguria: Secondary | ICD-10-CM | POA: Diagnosis not present

## 2023-04-10 LAB — BASIC METABOLIC PANEL
Anion gap: 10 (ref 5–15)
BUN: 31 mg/dL — ABNORMAL HIGH (ref 8–23)
CO2: 22 mmol/L (ref 22–32)
Calcium: 8.8 mg/dL — ABNORMAL LOW (ref 8.9–10.3)
Chloride: 101 mmol/L (ref 98–111)
Creatinine, Ser: 2.28 mg/dL — ABNORMAL HIGH (ref 0.61–1.24)
GFR, Estimated: 32 mL/min — ABNORMAL LOW (ref >60)
Glucose, Bld: 271 mg/dL — ABNORMAL HIGH (ref 70–99)
Potassium: 4.4 mmol/L (ref 3.5–5.1)
Sodium: 133 mmol/L — ABNORMAL LOW (ref 135–145)

## 2023-04-10 LAB — GLUCOSE, CAPILLARY
Glucose-Capillary: 418 mg/dL — ABNORMAL HIGH (ref 70–99)
Glucose-Capillary: 193 mg/dL — ABNORMAL HIGH (ref 70–99)
Glucose-Capillary: 156 mg/dL — ABNORMAL HIGH (ref 70–99)
Glucose-Capillary: 316 mg/dL — ABNORMAL HIGH (ref 70–99)
Glucose-Capillary: 158 mg/dL — ABNORMAL HIGH (ref 70–99)
Glucose-Capillary: 154 mg/dL — ABNORMAL HIGH (ref 70–99)

## 2023-04-10 LAB — CBC
HCT: 36.4 % — ABNORMAL LOW (ref 39.0–52.0)
Hemoglobin: 12.3 g/dL — ABNORMAL LOW (ref 13.0–17.0)
MCH: 32.5 pg (ref 26.0–34.0)
MCHC: 33.8 g/dL (ref 30.0–36.0)
MCV: 96.3 fL (ref 80.0–100.0)
Platelets: 182 10*3/uL (ref 150–400)
RBC: 3.78 MIL/uL — ABNORMAL LOW (ref 4.22–5.81)
RDW: 13 % (ref 11.5–15.5)
WBC: 5.9 10*3/uL (ref 4.0–10.5)
nRBC: 0 % (ref 0.0–0.2)

## 2023-04-10 LAB — HEMOGLOBIN A1C
Hgb A1c MFr Bld: 7.4 % — ABNORMAL HIGH (ref 4.8–5.6)
Mean Plasma Glucose: 166 mg/dL

## 2023-04-10 LAB — MRSA NEXT GEN BY PCR, NASAL: MRSA by PCR Next Gen: NOT DETECTED

## 2023-04-10 LAB — MAGNESIUM: Magnesium: 1.9 mg/dL (ref 1.7–2.4)

## 2023-04-10 MED ORDER — DICLOFENAC SODIUM 1 % EX GEL
4.0000 g | Freq: Four times a day (QID) | CUTANEOUS | Status: DC
  Administered 2023-04-10 – 2023-04-17 (×22): 4 g via TOPICAL
  Filled 2023-04-10: qty 100

## 2023-04-10 MED ORDER — LIDOCAINE 5 % EX PTCH
1.0000 | MEDICATED_PATCH | CUTANEOUS | Status: DC
  Administered 2023-04-10 – 2023-04-11 (×2): 1 via TRANSDERMAL
  Filled 2023-04-10 (×3): qty 1

## 2023-04-10 MED ORDER — MAGNESIUM SULFATE 2 GM/50ML IV SOLN
2.0000 g | Freq: Once | INTRAVENOUS | Status: AC
  Administered 2023-04-10: 2 g via INTRAVENOUS
  Filled 2023-04-10: qty 50

## 2023-04-10 MED ORDER — MUPIROCIN 2 % EX OINT
1.0000 | TOPICAL_OINTMENT | Freq: Two times a day (BID) | CUTANEOUS | Status: DC
  Administered 2023-04-10 – 2023-04-13 (×8): 1 via NASAL
  Filled 2023-04-10: qty 22

## 2023-04-10 MED ORDER — SODIUM CHLORIDE 0.9% FLUSH: 3.0000 mL | INTRAVENOUS | Status: DC | PRN

## 2023-04-10 MED ORDER — HEPARIN (PORCINE) 25000 UT/250ML-% IV SOLN
1200.0000 [IU]/h | INTRAVENOUS | Status: DC
  Administered 2023-04-10 – 2023-04-11 (×2): 1200 [IU]/h via INTRAVENOUS
  Filled 2023-04-10 (×3): qty 250

## 2023-04-10 MED ORDER — SODIUM CHLORIDE 0.9 % IV SOLN
INTRAVENOUS | Status: DC | PRN
Start: 2023-04-10 — End: 2023-04-10

## 2023-04-10 MED ORDER — OXYCODONE HCL ER 15 MG PO T12A
15.0000 mg | EXTENDED_RELEASE_TABLET | Freq: Two times a day (BID) | ORAL | Status: DC
  Administered 2023-04-10 – 2023-04-17 (×15): 15 mg via ORAL
  Filled 2023-04-10 (×15): qty 1

## 2023-04-10 MED ORDER — SODIUM CHLORIDE 0.9% FLUSH
3.0000 mL | Freq: Two times a day (BID) | INTRAVENOUS | Status: DC
  Administered 2023-04-10: 10 mL via INTRAVENOUS
  Administered 2023-04-13: 3 mL via INTRAVENOUS

## 2023-04-10 MED ORDER — FUROSEMIDE 10 MG/ML IJ SOLN
40.0000 mg | Freq: Once | INTRAMUSCULAR | Status: AC
  Administered 2023-04-10: 40 mg via INTRAVENOUS
  Filled 2023-04-10: qty 4

## 2023-04-10 MED ORDER — CHLORHEXIDINE GLUCONATE CLOTH 2 % EX PADS
6.0000 | MEDICATED_PAD | Freq: Every day | CUTANEOUS | Status: DC
  Administered 2023-04-11 – 2023-04-13 (×3): 6 via TOPICAL

## 2023-04-10 MED ORDER — HYDROXYZINE HCL 25 MG PO TABS
25.0000 mg | ORAL_TABLET | Freq: Once | ORAL | Status: AC
  Administered 2023-04-10: 25 mg via ORAL
  Filled 2023-04-10: qty 1

## 2023-04-10 MED ORDER — FLUTICASONE PROPIONATE 50 MCG/ACT NA SUSP
2.0000 | Freq: Every day | NASAL | Status: DC
  Administered 2023-04-10 – 2023-04-17 (×8): 2 via NASAL
  Filled 2023-04-10: qty 16

## 2023-04-10 MED ORDER — INSULIN GLARGINE-YFGN 100 UNIT/ML ~~LOC~~ SOLN
28.0000 [IU] | Freq: Every day | SUBCUTANEOUS | Status: DC
  Administered 2023-04-11: 28 [IU] via SUBCUTANEOUS
  Filled 2023-04-10: qty 0.28

## 2023-04-10 MED ORDER — FAMOTIDINE 20 MG PO TABS
20.0000 mg | ORAL_TABLET | Freq: Every day | ORAL | Status: DC
  Administered 2023-04-11 – 2023-04-17 (×7): 20 mg via ORAL
  Filled 2023-04-10 (×7): qty 1

## 2023-04-10 MED ORDER — ACETAMINOPHEN 500 MG PO TABS
1000.0000 mg | ORAL_TABLET | Freq: Four times a day (QID) | ORAL | Status: DC
  Administered 2023-04-10 – 2023-04-15 (×18): 1000 mg via ORAL
  Filled 2023-04-10 (×18): qty 2

## 2023-04-10 NOTE — Consult Note (Signed)
 Cardiology Consultation   Patient ID: David [name] MRN: [id number]; DOB: June 22, 1960  Admit date: 04/09/2023 Date of Consult: 04/10/2023  PCP:  Delbert Clam, MD   [facility] Providers Cardiologist:  Maude Emmer, MD     Patient Profile:   David [name] is a 63 y.o. male with a hx of HFimpEF, DM, HTN, persistent atrial fibrillation, hx of MRSA bacteremia c/b osteomyelitis who is being seen 04/10/2023 for the evaluation of abnormal EKG/elevated troponin/CHF at the request of Dr. Rosan.  History of Present Illness:   David [name] is a 63 year old male with past medical history noted above.  He has previously been followed by Dr. Emmer.  Known history of atrial fibrillation with failed cardioversion.  Placed on sotalol  with plans to repeat cardioversion but never return for procedure and stopped his medications.  He was admitted 10/2018 with recurrent atrial fibrillation with RVR and found to have a low EF.  Recommendations at that time were to attempt rate control and repeat echocardiogram in 3 months with need for ischemic evaluation if did not improve.  He was lost to follow-up.  Seen in the clinic 06/2021 with [name], PA and noted fatigue and lethargy for about a year.  He was continued on GDMT with Coreg  25 mg twice daily, digoxin  0.125 mg daily, Lasix  40 mg daily, spironolactone  12.5 mg daily, Entresto  49-51 mg twice daily.  Deferred initiation of SGLT2 until his diabetes was better controlled with recommendations for outpatient Myoview  and echocardiogram.   Unfortunately he was admitted to the hospital with a MRSA bacteremia infection and underwent toe amputation with [name].  Therefore his Lexiscan  Myoview  was delayed.  He was seen in the clinic 06/2022 with [name].  He was referred to EP for follow-up for A-fib ablation versus repeat cardioversion.  He was hospitalized 4/9-5/17 and then readmitted 5/18-10/2022 with T7/T9 9 discitis/vertebral osteomyelitis secondary  to MRSA bacteremia with likely source felt to be osteomyelitis of the foot.  Presented to the ED on 1/5 with complaints of shortness of breath. Reports sudden onset that morning, was recently started on a new pain medication for his back and thought it may have been related to that. Denies any chest pain. Reports he has actually been doing quite well since his prolonged hospitalizations last year and has been going for PT on a regular basis.   Labs in the ED showed sodium 134, potassium 3.8, creatinine 1.4, BNP 419, high-sensitivity troponin 210>> 259>> 253, WBC 12.3, hemoglobin 13.3, hemoglobin A1c 7.4.  EKG showed atrial fibrillation, 81 bpm, T wave inversion with ST depression in anterior lateral leads with slight ST elevation in aVR.  Chest x-ray with pulmonary vascular congestion, small right pleural effusion.  He was admitted to internal medicine for further management.  Placed on IV Lasix .  Given abnormal EKG findings and elevated troponin, cardiology was asked to evaluate.  He denies any chest pain prior to admission, reports being ambulatory around his home with a cane. Does ADLs without angina or shortness of breath. Complaint with home medications. He works as a investment banker, operational, loves his job and anxious to get back to it.   Past Medical History:  Diagnosis Date   Cardiomyopathy (HCC)    a. EF 45% in 2019.   CHF (congestive heart failure) (HCC)    Chronic anticoagulation 09/30/2017   Diabetes mellitus type 2 in nonobese [facility])    Discitis of thoracic region 09/25/2022   Does not have health insurance  Essential hypertension 09/26/2017   Financial difficulties    H/O noncompliance with medical treatment, presenting hazards to health    Hardware complicating wound infection (HCC) 09/25/2022   Non-insulin  treated type 2 diabetes mellitus (HCC) 09/26/2017   Persistent atrial fibrillation (HCC)    Sleep apnea suspected 09/30/2017    Past Surgical History:  Procedure Laterality Date    ABDOMINAL AORTOGRAM W/LOWER EXTREMITY N/A 09/02/2021   Procedure: ABDOMINAL AORTOGRAM W/LOWER EXTREMITY;  Surgeon: Serene Gaile ORN, MD;  Location: [facility];  Service: Cardiovascular;  Laterality: N/A;   ABDOMINAL AORTOGRAM W/LOWER EXTREMITY N/A 12/28/2021   Procedure: ABDOMINAL AORTOGRAM W/LOWER EXTREMITY;  Surgeon: Serene Gaile ORN, MD;  Location: [facility];  Service: Cardiovascular;  Laterality: N/A;   ABDOMINAL AORTOGRAM W/LOWER EXTREMITY N/A 07/28/2022   Procedure: ABDOMINAL AORTOGRAM W/LOWER EXTREMITY;  Surgeon: Gretta Lonni PARAS, MD;  Location: [facility];  Service: Cardiovascular;  Laterality: N/A;   AMPUTATION Right 09/04/2021   Procedure: 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;  Surgeon: Harden Jerona GAILS, MD;  Location: [facility] OR;  Service: Orthopedics;  Laterality: Right;   AMPUTATION Right 08/02/2022   Procedure: AMPUTATION RAY 5TH METATARSAL OF RIGHT FOOT;  Surgeon: Barton Drape, MD;  Location: [facility] OR;  Service: Orthopedics;  Laterality: Right;   APPENDECTOMY  1971   BONE BIOPSY Right 08/02/2022   Procedure: BONE BIOPSY OF 5TH METATARSAL RIGHT FOOT;  Surgeon: Barton Drape, MD;  Location: [facility] OR;  Service: Orthopedics;  Laterality: Right;   CARDIOVERSION N/A 09/28/2017   Procedure: CARDIOVERSION;  Surgeon: Francyne Headland, MD;  Location: [facility]OS[facility];  Service: Cardiovascular;  Laterality: N/A;   IRRIGATION AND DEBRIDEMENT FOOT  08/02/2022   Procedure: IRRIGATION AND DEBRIDEMENT RIGHT 5TH METATARSAL FOOT;  Surgeon: Barton Drape, MD;  Location: [facility] OR;  Service: Orthopedics;;   LAMINECTOMY WITH POSTERIOR LATERAL ARTHRODESIS LEVEL 4 N/A 08/30/2022   Procedure: THORACIC FIVE-THORACIC ELEVEN FUSION, THORACIC EIGHT TRANSPEDICULAR DECOMPRESSION AND PARTIAL CORPECTOMY with O-Arm;  Surgeon: Debby Dorn MATSU, MD;  Location: [facility] OR;  Service: Neurosurgery;  Laterality: N/A;   PERIPHERAL VASCULAR BALLOON ANGIOPLASTY  09/02/2021   Procedure: PERIPHERAL VASCULAR BALLOON  ANGIOPLASTY;  Surgeon: Serene Gaile ORN, MD;  [facility]: [facility];  Service: Cardiovascular;;   PERIPHERAL VASCULAR BALLOON ANGIOPLASTY  12/28/2021   Procedure: PERIPHERAL VASCULAR BALLOON ANGIOPLASTY;  Surgeon: Serene Gaile ORN, MD;  [facility]: [facility];  Service: Cardiovascular;;  Posterior Tibial PTA only   TEE WITHOUT CARDIOVERSION N/A 09/28/2017   Procedure: TRANSESOPHAGEAL ECHOCARDIOGRAM (TEE);  Surgeon: Francyne Headland, MD;  Location: [facility] ENDOSCOPY;  Service: Cardiovascular;  Laterality: N/A;   TEE WITHOUT CARDIOVERSION N/A 09/03/2021   Procedure: TRANSESOPHAGEAL ECHOCARDIOGRAM (TEE);  Surgeon: Raford Riggs, MD;  Location: [facility] ENDOSCOPY;  Service: Cardiovascular;  Laterality: N/A;   TEE WITHOUT CARDIOVERSION N/A 07/15/2022   Procedure: TRANSESOPHAGEAL ECHOCARDIOGRAM;  Surgeon: Francyne Headland, MD;  [facility]: [facility];  Service: Cardiovascular;  Laterality: N/A;    Inpatient Medications: Scheduled Meds:  [START ON 04/11/2023] Chlorhexidine  Gluconate Cloth  6 each Topical Q0600   digoxin   125 mcg Oral Daily   doxycycline   100 mg Oral Q12H   [START ON 04/11/2023] famotidine   20 mg Oral Daily   insulin  aspart  0-15 Units Subcutaneous TID WC   insulin  glargine-yfgn  27 Units Subcutaneous Daily   mupirocin  ointment  1 Application Nasal BID   oxyCODONE   10 mg Oral Q12H   pregabalin   75 mg Oral QHS   rosuvastatin   20 mg  Oral Daily   sodium chloride  flush  3-10 mL Intravenous Q12H   Continuous Infusions:  PRN Meds: methocarbamol , sodium chloride  flush  Allergies:   No Known Allergies  Social History:   Social History   Socioeconomic History   Marital status: Married    Spouse name: Not on file   Number of children: Not on file   Years of education: Not on file   Highest education level: Some college, no degree  Occupational History   Not on file  Tobacco Use   Smoking status: Former    Current packs/day: 0.00    Types: Cigarettes    Start date:  03/19/2003    Quit date: 03/18/2018    Years since quitting: 5.0   Smokeless tobacco: Never   Tobacco comments:    09/26/2017 2-3 cigarettes/month now  Vaping Use   Vaping status: Never Used  Substance and Sexual Activity   Alcohol use: Not Currently    Alcohol/week: 3.0 standard drinks of alcohol    Types: 3 Cans of beer per week    Comment: States he quit drinking 8-9 months ago   Drug use: Never   Sexual activity: Not Currently  Other Topics Concern   Not on file  Social History Narrative   Not on file   Social Drivers of Health   Financial Resource Strain: Medium Risk (03/01/2023)   Overall Financial Resource Strain (CARDIA)    Difficulty of Paying Living Expenses: Somewhat hard  Food Insecurity: Food Insecurity Present (04/10/2023)   Hunger Vital Sign    Worried About Running Out of Food in the Last Year: Never true    Ran Out of Food in the Last Year: Sometimes true  Transportation Needs: Unmet Transportation Needs (04/10/2023)   PRAPARE - Transportation    Lack of Transportation (Medical): Yes    Lack of Transportation (Non-Medical): Patient declined  Physical Activity: Insufficiently Active (03/01/2023)   Exercise Vital Sign    Days of Exercise per Week: 3 days    Minutes of Exercise per Session: 30 min  Stress: Stress Concern Present (03/01/2023)   [employer] of Occupational Health - Occupational Stress Questionnaire    Feeling of Stress : To some extent  Social Connections: Unknown (03/01/2023)   Social Connection and Isolation Panel [NHANES]    Frequency of Communication with Friends and Family: Once a week    Frequency of Social Gatherings with Friends and Family: Once a week    Attends Religious Services: Patient declined    Database Administrator or Organizations: No    Attends Engineer, Structural: Not on file    Marital Status: Married  Catering Manager Violence: Not At Risk (04/10/2023)   Humiliation, Afraid, Rape, and Kick questionnaire     Fear of Current or Ex-Partner: No    Emotionally Abused: No    Physically Abused: No    Sexually Abused: No    Family History:    Family History  Problem Relation Age of Onset   Hypertension Mother      ROS:  Please see the history of present illness.   All other ROS reviewed and negative.     Physical Exam/Data:   Vitals:   04/10/23 0038 04/10/23 0425 04/10/23 0740 04/10/23 1040  BP: 100/70 92/64 111/71 104/76  Pulse: 75 82 62 61  Resp:   18 20  Temp: 98.5 F (36.9 C) (!) 97.3 F (36.3 C) 99 F (37.2 C) 98 F (36.7 C)  TempSrc: Oral  Oral Oral  SpO2: 94% 100% 91% 95%  Weight:      Height:        Intake/Output Summary (Last 24 hours) at 04/10/2023 1346 Last data filed at 04/10/2023 1144 Gross per 24 hour  Intake 837 ml  Output 1075 ml  Net -238 ml      04/09/2023    3:38 PM 04/09/2023   10:54 AM 03/20/2023    3:13 PM  Last 3 Weights  Weight (lbs) 168 lb 14 oz 175 lb 170 lb  Weight (kg) 76.6 kg 79.379 kg 77.111 kg     Body mass index is 22.9 kg/m.  General:  Well nourished, well developed, in no acute distress HEENT: normal Neck: no JVD Vascular: No carotid bruits; Distal pulses 2+ bilaterally Cardiac:  normal S1, S2; RRR; no murmur  Lungs:  clear to auscultation bilaterally, no wheezing, rhonchi or rales  Abd: soft, nontender, no hepatomegaly  Ext: no edema Musculoskeletal:  No deformities, BUE and BLE strength normal and equal Skin: warm and dry  Neuro:  CNs 2-12 intact, no focal abnormalities noted Psych:  Normal affect   EKG:  The EKG was personally reviewed and demonstrates: 1/5 atrial fibrillation, 81 bpm, T wave inversion with ST depression in anterior lateral leads with slight ST elevation in aVR  1/6 atrial fibrillation with continued TWI in anterior lateral leads, prolonged QT Telemetry:  Telemetry was personally reviewed and demonstrates:  rate controlled atrial fibrillation  Relevant CV Studies:    Laboratory Data:  High Sensitivity  Troponin:   Recent Labs  Lab 04/09/23 1101 04/09/23 1314 04/09/23 1624  TROPONINIHS 210* 259* 253*     Chemistry Recent Labs  Lab 04/09/23 1101 04/10/23 0225  NA 134* 133*  K 3.8 4.4  CL 102 101  CO2 22 22  GLUCOSE 141* 271*  BUN 26* 31*  CREATININE 1.42* 2.28*  CALCIUM  9.4 8.8*  MG  --  1.9  GFRNONAA 56* 32*  ANIONGAP 10 10    Recent Labs  Lab 04/09/23 1101  PROT 6.6  ALBUMIN  3.5  AST 16  ALT 15  ALKPHOS 75  BILITOT 1.5*   Lipids No results for input(s): CHOL, TRIG, HDL, LABVLDL, LDLCALC, CHOLHDL in the last 168 hours.  Hematology Recent Labs  Lab 04/09/23 1101 04/10/23 0225  WBC 12.3* 5.9  RBC 4.07* 3.78*  HGB 13.3 12.3*  HCT 38.3* 36.4*  MCV 94.1 96.3  MCH 32.7 32.5  MCHC 34.7 33.8  RDW 12.9 13.0  PLT 194 182   Thyroid No results for input(s): TSH, FREET4 in the last 168 hours.  BNP Recent Labs  Lab 04/09/23 1101  BNP 419.7*    DDimer No results for input(s): DDIMER in the last 168 hours.   Radiology/Studies:  DG Chest Port 1 View Result Date: 04/09/2023 CLINICAL DATA:  Shortness of breath. EXAM: PORTABLE CHEST 1 VIEW COMPARISON:  02/20/2023 FINDINGS: Stable cardiomediastinal contours. Pulmonary vascular congestion. Small right pleural effusion. No airspace opacities. Postoperative changes noted within the thoracic spine from previous posterior hardware fixation. IMPRESSION: Pulmonary vascular congestion and small right pleural effusion. Electronically Signed   By: Waddell Calk M.D.   On: 04/09/2023 11:20     Assessment and Plan:   David [name] is a 63 y.o. male with a hx of HFmrEF, DM, HTN, persistent atrial fibrillation, hx of MRSA bacteremia c/b osteomyelitis who is being seen 04/10/2023 for the evaluation of abnormal EKG/elevated troponin/CHF at the request of Dr. Rosan.  HFimpEF -- hx  of LVEF of 30-35% but improved on TEE/TTE 07/2022 60-65%. Presented with acute onset shortness of breath morning of 1/5. BNP 419 and CXR  with pulmonary edema -- s/p IV lasix , UOP 1L since admission and breathing improved -- symptoms are concerning for anginal equivalent  -- with rise in Cr, hold on additional lasix  for now  -- repeat echo -- GDMT: BPs are soft, therefore no room for additional therapy at this time   NSTEMI Abnormal EKG -- EKG on admission is concerning for ACS with TWI in anterolateral leads and ST elevation an aVR -- hsTn flat 200s -- no chest pain PTA -- he will need to undergo cardiac cath, but would like to see improvement in Cr prior. Tentatively plan for 1/8  Informed Consent   Shared Decision Making/Informed Consent The risks [stroke (1 in 1000), death (1 in 1000), kidney failure [usually temporary] (1 in 500), bleeding (1 in 200), allergic reaction [possibly serious] (1 in 200)], benefits (diagnostic support and management of coronary artery disease) and alternatives of a cardiac catheterization were discussed in detail with David [name] and he is willing to proceed.  Persistent atrial fibrillation -- hx of failed prior DCCV -- remains rate controlled, on dig 0.125mg  daily  -- hold Eliquis , add IV heparin  with plans for cardiac cath   HTN -- actually soft most recently -- hold meds for now  DM -- Hgb A1c 7.4 -- on lantus  and farxiga    CKD with AKI -- Cr 1.4 on admission up to 2.2 today -- follow up BMET in am -- could consider renal artery dopplers with labile BPs  Per Primary Chronic back pain Hx of MRSA bacteremia   Risk Assessment/Risk Scores:   TIMI Risk Score for Unstable Angina or Non-ST Elevation MI:   The patient's TIMI risk score is 4, which indicates a 20% risk of all cause mortality, new or recurrent myocardial infarction or need for urgent revascularization in the next 14 days.{   New York  Heart Association (NYHA) Functional Class NYHA Class II  CHA2DS2-VASc Score = 4  This indicates a 4.8% annual risk of stroke. The patient's score is based upon: CHF History:  1 HTN History: 1 Diabetes History: 1 Stroke History: 0 Vascular Disease History: 1 Age Score: 0 Gender Score: 0   For questions or updates, please contact [facility] Please consult [email or web address] for contact info under    Signed, Manuelita Rummer, NP  04/10/2023 1:46 PM

## 2023-04-10 NOTE — Plan of Care (Signed)

## 2023-04-10 NOTE — Evaluation (Signed)
 Physical Therapy Evaluation Patient Details Name: David [name] MRN: [id number] DOB: 12-18-1960 Today's Date: 04/10/2023  History of Present Illness  Patient is 63 y.o. male who presented with dyspnea and is admitted for acute heart failure exacerbation. Of note pt had extended hospital stay from 08/20/22-10/28/22 during which he underwent surgical repair 08/30/22: T5-11 posterior/posterolateral arthrodesis  & Transpedicular decompression with partial corpectomy, left T8 & T8-9 laminectomy, bilateral facetectomy. 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 Since discharge in May 2024 pt has represented to ED for back pain x3 and for fall x1.   Clinical Impression  David [name] is 63 y.o. male admitted with above HPI and diagnosis. Patient is currently limited by functional impairments below (see PT problem list). Patient lives with spouse and extended family and is mod ind with SBQC for gait and mod ind with ADL's PTA. Currently pt is able to complete bed mob at Mod ind, transfers at supervision/mod ind level, and gait with CGA to supervision with QC and no AD. Overall no LOB noted with gait, pt maintains wide BOS throughout and demonstrates gait pattern emphasizing bil LE dorsiflexion to improve foot clearance with slight hyperextension at knee during stance. Patient will benefit from continued skilled PT interventions to address impairments and progress independence with mobility, recommending pt continue with OPPT to address ongoing balance and strength deficits. Acute PT will follow and progress as able.         If plan is discharge home, recommend the following: Assist for transportation;Help with stairs or ramp for entrance   Can travel by private vehicle        Equipment Recommendations None  recommended by PT  Recommendations for Other Services       Functional Status Assessment Patient has had a recent decline in their functional status and demonstrates the ability to make significant improvements in function in a reasonable and predictable amount of time.     Precautions / Restrictions Precautions Precautions: Fall Precaution Comments: 1 fall Restrictions Weight Bearing Restrictions Per Provider Order: No      Mobility  Bed Mobility Overal bed mobility: Modified Independent Bed Mobility: Supine to Sit     Supine to sit: Modified independent (Device/Increase time)          Transfers Overall transfer level: Needs assistance Equipment used: None Transfers: Sit to/from Stand, Bed to chair/wheelchair/BSC Sit to Stand: Modified independent (Device/Increase time)   Step pivot transfers: Supervision            Ambulation/Gait Ambulation/Gait assistance: Contact guard assist Gait Distance (Feet): 300 Feet Assistive device: None, Quad cane Gait Pattern/deviations: Step-through pattern, Wide base of support, Drifts right/left Gait velocity: decr        Stairs            Wheelchair Mobility     Tilt Bed    Modified Rankin (Stroke Patients Only)       Balance Overall balance assessment: Needs assistance Sitting-balance support: Feet supported Sitting balance-Leahy Scale: Good     Standing balance support: During functional activity, No upper extremity supported, Single extremity supported Standing balance-Leahy Scale: Good                               Pertinent Vitals/Pain Pain  Assessment Pain Assessment: 0-10 Pain Score: 6  Pain Location: back radiating into chest/abdomen Pain Descriptors / Indicators: Aching, Discomfort, Constant Pain Intervention(s): Limited activity within patient's tolerance, Monitored during session, Repositioned    Home Living Family/patient expects to be discharged to:: Private  residence Living Arrangements: Spouse/significant other Available Help at Discharge: Family Type of Home: House Home Access: Level entry       Home Layout: One level Home Equipment: Hand held Programmer, Systems (2 wheels);Cane - quad;Wheelchair - manual;BSC/3in1 Additional Comments: staying with his cousin now    Prior Function                 ADLs Comments: shower set (bucket)  w/ no slip mat.     Extremity/Trunk Assessment   Upper Extremity Assessment Upper Extremity Assessment: Overall WFL for tasks assessed;Defer to OT evaluation    Lower Extremity Assessment Lower Extremity Assessment: RLE deficits/detail;LLE deficits/detail (5x sit<>stand: 12.66 seconds. no UE use, pt using back of legs aginst chair for power up.) RLE Coordination: decreased gross motor LLE Coordination: decreased gross motor    Five times Sit to Stand Test (FTSS) Method: Use a straight back chair with a solid seat that is 16-18" high. Ask participant to sit on the chair with arms folded across their chest.   Instructions: "Stand up and sit down as quickly as possible 5 times, keeping your arms folded across your chest."   Measurement: Stop timing when the participant stands the 5th time.  TIME: __12.66____ (in seconds)  Times > 13.6 seconds is associated with increased disability and morbidity (Guralnik, 2000) Times > 15 seconds is predictive of recurrent falls in healthy individuals aged 95 and older (Buatois, et al., 2008) Normal performance values in community dwelling individuals aged 62 and older (Bohannon, 2006): 60-69 years: 11.4 seconds 70-79 years: 12.6 seconds 80-89 years: 14.8 seconds  MCID: >= 2.3 seconds for Vestibular Disorders (Meretta, 2006)   Cervical / Trunk Assessment Cervical / Trunk Assessment: Back Surgery  Communication   Communication Communication: No apparent difficulties  Cognition Arousal: Alert Behavior During Therapy: WFL for tasks  assessed/performed Overall Cognitive Status: Within Functional Limits for tasks assessed                                          General Comments      Exercises     Assessment/Plan    PT Assessment Patient needs continued PT services  PT Problem List Decreased strength;Decreased activity tolerance;Decreased balance;Decreased mobility;Decreased coordination;Decreased safety awareness;Pain       PT Treatment Interventions DME instruction;Gait training;Stair training;Functional mobility training;Therapeutic activities;Therapeutic exercise;Balance training;Neuromuscular re-education;Patient/family education    PT Goals (Current goals can be found in the Care Plan section)  Acute Rehab PT Goals Patient Stated Goal: get home and back to rehab PT Goal Formulation: With patient Time For Goal Achievement: 04/24/23 Potential to Achieve Goals: Good    Frequency Min 1X/week     Co-evaluation               AM-PAC PT 6 Clicks Mobility  Outcome Measure Help needed turning from your back to your side while in a flat bed without using bedrails?: None Help needed moving from lying on your back to sitting on the side of a flat bed without using bedrails?: None Help needed moving to and from a bed to a chair (including a wheelchair)?: A Little  Help needed standing up from a chair using your arms (e.g., wheelchair or bedside chair)?: None Help needed to walk in hospital room?: A Little Help needed climbing 3-5 steps with a railing? : A Little 6 Click Score: 21    End of Session Equipment Utilized During Treatment: Gait belt Activity Tolerance: Patient tolerated treatment well Patient left: in chair;with call bell/phone within reach Nurse Communication: Mobility status PT Visit Diagnosis: Unsteadiness on feet (R26.81);Difficulty in walking, not elsewhere classified (R26.2);Other symptoms and signs involving the nervous system (R29.898);Pain Pain - part of body:   (back)    Time: 8783-8763 PT Time Calculation (min) (ACUTE ONLY): 20 min   Charges:   PT Evaluation $PT Eval Moderate Complexity: 1 Mod   PT General Charges $$ ACUTE PT VISIT: 1 Visit         Vernell DONEEN KLEIN, DPT Acute Rehabilitation Services Office [phone]  04/10/23 1:03 PM

## 2023-04-10 NOTE — Progress Notes (Addendum)
 Summary: David [name] is a 63 y.o. 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  Hospital Day: 1  Subjective:  Patient reports he is feeling better. He said he can breath much better than yesterday. Denies difficulty breathing, CP or abdominal pain. Denies shortness of breath when sleeping.   Overnight Events: CBG 418 at 0552, 15 units of insulin  given. CBG 193 at 0651.   Objective:  Vital signs in last 24 hours: Vitals:   04/10/23 0038 04/10/23 0425 04/10/23 0740 04/10/23 1040  BP: 100/70 92/64 111/71 104/76  Pulse: 75 82 62 61  Resp:   18 20  Temp: 98.5 F (36.9 C) (!) 97.3 F (36.3 C) 99 F (37.2 C) 98 F (36.7 C)  TempSrc: Oral  Oral Oral  SpO2: 94% 100% 91% 95%  Weight:      Height:       Physical Exam General: NAD, pleasant, cooperative Cardiovascular: irregularly irregular rhythm, JVD present, hepatojugular reflux positive  Pulmonary: normal work of breathing, lungs clear to auscultation, O2 saturation of 98% on RA during exam Abdominal: soft, non-tender Neurological: alert and oriented  Extremities: no lower extremity peripheral edema present  Weight change:   Intake/Output Summary (Last 24 hours) at 04/10/2023 1159 Last data filed at 04/10/2023 1144 Gross per 24 hour  Intake 837 ml  Output 1075 ml  Net -238 ml   CMP     Component Value Date/Time   NA 133 (L) 04/10/2023 0225   NA 136 01/09/2023 0841   K 4.4 04/10/2023 0225   CL 101 04/10/2023 0225   CO2 22 04/10/2023 0225   GLUCOSE 271 (H) 04/10/2023 0225   BUN 31 (H) 04/10/2023 0225   BUN 21 01/09/2023 0841   CREATININE 2.28 (H) 04/10/2023 0225   CREATININE 1.26 12/12/2022 1624   CALCIUM  8.8 (L) 04/10/2023 0225   PROT 6.6 04/09/2023 1101   PROT 6.8 01/09/2023 0841   ALBUMIN  3.5 04/09/2023 1101    ALBUMIN  4.2 01/09/2023 0841   AST 16 04/09/2023 1101   ALT 15 04/09/2023 1101   ALKPHOS 75 04/09/2023 1101   BILITOT 1.5 (H) 04/09/2023 1101   BILITOT 1.4 (H) 01/09/2023 0841   EGFR 64 01/09/2023 0841   GFRNONAA 32 (L) 04/10/2023 0225      Latest Ref Rng & Units 04/10/2023    2:25 AM 04/09/2023   11:01 AM 12/12/2022    4:24 PM  CBC  WBC 4.0 - 10.5 K/uL 5.9  12.3  6.6   Hemoglobin 13.0 - 17.0 g/dL 87.6  86.6  85.2   Hematocrit 39.0 - 52.0 % 36.4  38.3  43.6   Platelets 150 - 400 K/uL 182  194  222    Troponin: 259 -> 253  EKG: atrial fibrillation, t wave inversion in leads I, aVL and V2-V6  Assessment/Plan:  Principal Problem:   Acute systolic heart failure (HCC) Active Problems:   Type 2 diabetes mellitus (HCC)   Atrial fibrillation (HCC)   Acute hypoxic respiratory failure (HCC)   AKI (acute kidney injury), ruled out   Chronic pain syndrome   Elevated troponin   MRSA infection greater than 3 months ago  Acute Systolic Heart Failure Acute hypoxic respiratory failure  HFmrEF (EF 45%) Elevated creatinine Patient appears to be improving after diuresis with furosemide . Concern for  iatrogenic AKI as patient's creatinine increased over 0.3 mg/dL since yesterday after initiation of furosemide . Entresto  and furosemide  stopped this morning until patient examined. Patient appears to still have increased fluid given JVD present on exam, so furosemide  continued. Will continue to track creatinine and potassium with BMP tomorrow AM. Magnesium  1.9 mg/dL this AM, so magnesium  sulfate given IV to attempt to increase magnesium  levels above 2 mg/dL. Will continue strict I/Os and home digoxin  125 mcg daily. Will hold home coreg  and Entresto . Recommend echo before discharge when patient's volume status is optimized.   Atrial fibrillation on Eliquis   Hx of known Afib. EKG revealed atrial fibrillation. HR normal and hemodynamically stable. Continue home Eliquis  5 mg BID.   Elevated  Troponin Patient's troponin appears to be stable with slight down trend. After reviewing EKG as a team, concern for possible ischemic changes with t wave inversion in the lateral leads. Do not suspect ACS as patient continues to deny CP and troponin levels decreased. Will consult cardiology to assess for possible ischemic changes and whether heart catheterization is warranted given patient's hx of CAD and first hospitalized heart failure exacerbation.   T2DM Elevated glucose this AM. 15 units of novolog  given. Will continue long-acting insulin  and SSI. Continue to check CBGs.  Chronic Back Pain Stable. Continue oxycodone  10 mg q12h while hospitalized and home robaxin  750 mg BID PRN and lyrica  75 mg daily.   Hx of MRSA bacteremia Stable. Continue home doxycycline  100 mg BID for chronic infection suppression.   GERD Stable. Given decrease in renal function, pharmacy recommended switching home famotidine  to 20 mg daily.  Diet: Regular VTE: Eliquis  IVF: None Code: Full  Dispo: Anticipate discharge in 1 day.   LOS: 0 days   David [name], Medical Student 04/10/2023, 11:59 AM

## 2023-04-10 NOTE — Progress Notes (Signed)
 Patient requesting orders for stronger pain medication. Also requesting orders for medications for anxiety and to help him sleep. Orders received.

## 2023-04-10 NOTE — Progress Notes (Signed)
 PHARMACY - ANTICOAGULATION CONSULT NOTE  Pharmacy Consult for heparin  Indication: chest pain/ACS  No Known Allergies  Patient Measurements: Height: 6' (182.9 cm) Weight: 76.6 kg (168 lb 14 oz) IBW/kg (Calculated) : 77.6 Heparin  Dosing Weight: 76kg  Vital Signs: Temp: 98 F (36.7 C) (01/06 1040) Temp Source: Oral (01/06 1040) BP: 104/76 (01/06 1040) Pulse Rate: 61 (01/06 1040)  Labs: Recent Labs    04/09/23 1101 04/09/23 1314 04/09/23 1624 04/10/23 0225  HGB 13.3  --   --  12.3*  HCT 38.3*  --   --  36.4*  PLT 194  --   --  182  CREATININE 1.42*  --   --  2.28*  TROPONINIHS 210* 259* 253*  --     Estimated Creatinine Clearance: 36.9 mL/min (A) (by C-G formula based on SCr of 2.28 mg/dL (H)).   Medical History: Past Medical History:  Diagnosis Date   Cardiomyopathy (HCC)    a. EF 45% in 2019.   CHF (congestive heart failure) (HCC)    Chronic anticoagulation 09/30/2017   Diabetes mellitus type 2 in nonobese [facility])    Discitis of thoracic region 09/25/2022   Does not have health insurance    Essential hypertension 09/26/2017   Financial difficulties    H/O noncompliance with medical treatment, presenting hazards to health    Hardware complicating wound infection (HCC) 09/25/2022   Non-insulin  treated type 2 diabetes mellitus (HCC) 09/26/2017   Persistent atrial fibrillation (HCC)    Sleep apnea suspected 09/30/2017     Assessment: David [name] admitted with NSTEMI. Pt on apixaban  PTA for hx AF, to start IV heparin  with need for cardiac cath. Last dose of apixaban  was 1/6 am ~0800.  Goal of Therapy:  Heparin  level 0.3-0.7 units/ml aPTT 66-102 seconds Monitor platelets by anticoagulation protocol: Yes   Plan:  Heparin  1200 units/h no bolus starting at 2000 Check heparin  level and aPTT 8h after starting  Viha Kriegel, PharmD, BCPS, [facility] Clinical Pharmacist [phone] Please check AMION for all [facility] Pharmacy numbers 04/10/2023

## 2023-04-10 NOTE — Progress Notes (Signed)
 Patient talked with this RN about the lost pain medicine and claimed he called his cousin. He confirmed that the medicine was in their house and apologized about the confusion earlier. He also said we can drop the investigation and again said sorry about the situation.

## 2023-04-10 NOTE — Evaluation (Signed)
 Occupational Therapy Evaluation Patient Details Name: David [name] MRN: [id number] DOB: October 09, 1960 Today's Date: 04/10/2023   History of Present Illness Patient is 63 y.o. male who presented with dyspnea and is admitted for acute heart failure exacerbation. Of note pt had extended hospital stay from 08/20/22-10/28/22 during which he underwent surgical repair 08/30/22: T5-11 posterior/posterolateral arthrodesis  & Transpedicular decompression with partial corpectomy, left T8 & T8-9 laminectomy, bilateral facetectomy. 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 Since discharge in May 2024 pt has represented to ED for back pain x3 and for fall x1.   Clinical Impression   Pt presents with decline in function and safety with ADLs and ADL mobility with impaired balance, back pain (chronic). PTA pt lived at home with his spouse and cousin, pt was Ind with ADLs/selfcare, IADLs, home mgt, was driving and used quad cane occasionally for mobility. Pt currently requires Sup with no AD for LB ADLs and functional mobility. Pt would benefit from acute OT services to maximize level of function and safety to return home       If plan is discharge home, recommend the following: Assistance with cooking/housework    Functional Status Assessment  Patient has not had a recent decline in their functional status  Equipment Recommendations  Tub/shower bench    Recommendations for Other Services       Precautions / Restrictions Precautions Precautions: Fall Precaution Comments: 1 fall Restrictions Weight Bearing Restrictions Per Provider Order: No      Mobility Bed Mobility Overal bed mobility: Modified Independent Bed Mobility: Supine to Sit     Supine to sit: Modified independent (Device/Increase time)           Transfers Overall transfer level: Needs assistance Equipment used: None Transfers: Sit to/from Stand, Bed to chair/wheelchair/BSC Sit to Stand: Modified independent (Device/Increase time)     Step pivot transfers: Supervision     General transfer comment: pt walked to bathroom, stood at sink for oral care      Balance Overall balance assessment: Needs assistance Sitting-balance support: Feet supported Sitting balance-Leahy Scale: Good     Standing balance support: During functional activity, No upper extremity supported, Single extremity supported Standing balance-Leahy Scale: Good                             ADL either performed or assessed with clinical judgement   ADL Overall ADL's : Needs assistance/impaired Eating/Feeding: Independent;Sitting   Grooming: Wash/dry hands;Wash/dry face;Oral care;Supervision/safety;Standing   Upper Body Bathing: Modified independent   Lower Body Bathing: Supervison/ safety   Upper Body Dressing : Modified independent   Lower Body Dressing: Supervision/safety   Toilet Transfer: Supervision/safety;Ambulation;Grab bars   Toileting- Clothing Manipulation and Hygiene: Modified independent;Sit to/from stand   Tub/ Shower Transfer: Supervision/safety;Ambulation;Grab bars   Functional mobility during ADLs: Supervision/safety       Vision Ability to See in Adequate Light: 0 Adequate Patient Visual Report: No change from baseline       Perception         Praxis         Pertinent Vitals/Pain Pain Assessment Pain Assessment: 0-10 Pain Score: 5  Pain Location: back radiating into chest/abdomen Pain Descriptors / Indicators: Aching, Discomfort, Constant, Dull Pain Intervention(s): Monitored during session, Premedicated before  session, Repositioned     Extremity/Trunk Assessment Upper Extremity Assessment Upper Extremity Assessment: Overall WFL for tasks assessed   Lower Extremity Assessment Lower  Extremity Assessment: Defer to PT evaluation RLE Coordination: decreased gross motor LLE Coordination: decreased gross motor   Cervical / Trunk Assessment Cervical / Trunk Assessment: Back Surgery   Communication Communication Communication: No apparent difficulties   Cognition Arousal: Alert Behavior During Therapy: WFL for tasks assessed/performed Overall Cognitive Status: Within Functional Limits for tasks assessed                                       General Comments       Exercises     Shoulder Instructions      Home Living Family/patient expects to be discharged to:: Private residence Living Arrangements: Spouse/significant other Available Help at Discharge: Family Type of Home: House Home Access: Level entry     Home Layout: One level     Bathroom Shower/Tub: Chief Strategy Officer: Standard Bathroom Accessibility: No   Home Equipment: Hand held Programmer, Systems (2 wheels);Cane - quad;Wheelchair - manual;BSC/3in1   Additional Comments: currently staying with his cousin      Prior Functioning/Environment Prior Level of Function : Independent/Modified Independent;Working/employed;Driving             Mobility Comments: utilized [facility] for mobility PTA 07/12/22, pt was using a RW at d/c 08/28/22 ADLs Comments: Ind with ADLs/selfcare, IADLs, drives, has makeshift shower set (bucket)  w/ no slip mat.        OT Problem List: Impaired balance (sitting and/or standing);Pain      OT Treatment/Interventions: Self-care/ADL training;DME and/or AE instruction;Therapeutic activities;Patient/family education    OT Goals(Current goals can be found in the care plan section) Acute Rehab OT Goals Patient Stated Goal: go home OT Goal Formulation: With patient Time For Goal Achievement: 04/24/23 Potential to Achieve Goals: Good ADL Goals Pt Will Perform Grooming: with supervision;with set-up;with modified independence;standing Pt  Will Perform Lower Body Bathing: with modified independence;sit to/from stand Pt Will Perform Lower Body Dressing: with modified independence;sit to/from stand Pt Will Perform Tub/Shower Transfer: with modified independence;ambulating;3 in 1;tub bench;grab bars Additional ADL Goal #1: Pt will safely gather ADL and toiletry items Sup - Mod I from drawers and closet in prep for morning ADLs without LOB  OT Frequency: Min 1X/week    Co-evaluation              AM-PAC OT 6 Clicks Daily Activity     Outcome Measure Help from another person eating meals?: None Help from another person taking care of personal grooming?: A Little Help from another person toileting, which includes using toliet, bedpan, or urinal?: None Help from another person bathing (including washing, rinsing, drying)?: A Little Help from another person to put on and taking off regular upper body clothing?: None Help from another person to put on and taking off regular lower body clothing?: A Little 6 Click Score: 21   End of Session    Activity Tolerance: Patient tolerated treatment well Patient left: Other (comment) (at sink)  OT Visit Diagnosis: Pain Pain - part of body:  (back)                Time: 8645-8581 OT Time Calculation (min): 24 min Charges:  OT General Charges $OT Visit: 1 Visit OT Evaluation $OT Eval Low Complexity: 1 Low OT Treatments $  Self Care/Home Management : 8-22 mins    Jacques Karna Loose 04/10/2023, 2:37 PM

## 2023-04-11 ENCOUNTER — Inpatient Hospital Stay (HOSPITAL_COMMUNITY): Payer: Medicaid Other

## 2023-04-11 DIAGNOSIS — I5031 Acute diastolic (congestive) heart failure: Secondary | ICD-10-CM

## 2023-04-11 DIAGNOSIS — R9431 Abnormal electrocardiogram [ECG] [EKG]: Secondary | ICD-10-CM | POA: Diagnosis not present

## 2023-04-11 DIAGNOSIS — E1151 Type 2 diabetes mellitus with diabetic peripheral angiopathy without gangrene: Secondary | ICD-10-CM | POA: Diagnosis not present

## 2023-04-11 DIAGNOSIS — I214 Non-ST elevation (NSTEMI) myocardial infarction: Secondary | ICD-10-CM | POA: Diagnosis not present

## 2023-04-11 DIAGNOSIS — Z794 Long term (current) use of insulin: Secondary | ICD-10-CM | POA: Diagnosis not present

## 2023-04-11 LAB — CBC
HCT: 37.4 % — ABNORMAL LOW (ref 39.0–52.0)
Hemoglobin: 12.7 g/dL — ABNORMAL LOW (ref 13.0–17.0)
MCH: 32.2 pg (ref 26.0–34.0)
MCHC: 34 g/dL (ref 30.0–36.0)
MCV: 94.7 fL (ref 80.0–100.0)
Platelets: 189 10*3/uL (ref 150–400)
RBC: 3.95 MIL/uL — ABNORMAL LOW (ref 4.22–5.81)
RDW: 12.9 % (ref 11.5–15.5)
WBC: 5.3 10*3/uL (ref 4.0–10.5)
nRBC: 0 % (ref 0.0–0.2)

## 2023-04-11 LAB — BASIC METABOLIC PANEL
Anion gap: 8 (ref 5–15)
BUN: 41 mg/dL — ABNORMAL HIGH (ref 8–23)
CO2: 23 mmol/L (ref 22–32)
Calcium: 9.2 mg/dL (ref 8.9–10.3)
Chloride: 101 mmol/L (ref 98–111)
Creatinine, Ser: 1.89 mg/dL — ABNORMAL HIGH (ref 0.61–1.24)
GFR, Estimated: 40 mL/min — ABNORMAL LOW (ref >60)
Glucose, Bld: 258 mg/dL — ABNORMAL HIGH (ref 70–99)
Potassium: 4.5 mmol/L (ref 3.5–5.1)
Sodium: 132 mmol/L — ABNORMAL LOW (ref 135–145)

## 2023-04-11 LAB — ECHOCARDIOGRAM COMPLETE
Area-P 1/2: 3.42 cm2
Calc EF: 67.6 %
Height: 72 in
S' Lateral: 2.6 cm
Single Plane A2C EF: 58.5 %
Single Plane A4C EF: 76.8 %
Weight: 2814.83 [oz_av]

## 2023-04-11 LAB — HEPARIN LEVEL (UNFRACTIONATED): Heparin Unfractionated: >1.1 [IU]/mL — ABNORMAL HIGH (ref 0.30–0.70)

## 2023-04-11 LAB — APTT
aPTT: 101 s — ABNORMAL HIGH (ref 24–36)
aPTT: 77 s — ABNORMAL HIGH (ref 24–36)

## 2023-04-11 LAB — GLUCOSE, CAPILLARY
Glucose-Capillary: 184 mg/dL — ABNORMAL HIGH (ref 70–99)
Glucose-Capillary: 222 mg/dL — ABNORMAL HIGH (ref 70–99)
Glucose-Capillary: 200 mg/dL — ABNORMAL HIGH (ref 70–99)
Glucose-Capillary: 227 mg/dL — ABNORMAL HIGH (ref 70–99)

## 2023-04-11 MED ORDER — HYDROXYZINE HCL 25 MG PO TABS
25.0000 mg | ORAL_TABLET | Freq: Once | ORAL | Status: AC
  Administered 2023-04-11: 25 mg via ORAL
  Filled 2023-04-11: qty 1

## 2023-04-11 MED ORDER — MELATONIN 5 MG PO TABS
5.0000 mg | ORAL_TABLET | Freq: Every day | ORAL | Status: DC
  Administered 2023-04-11 – 2023-04-17 (×7): 5 mg via ORAL
  Filled 2023-04-11 (×7): qty 1

## 2023-04-11 MED ORDER — ASPIRIN 81 MG PO CHEW
81.0000 mg | CHEWABLE_TABLET | ORAL | Status: AC
Start: 2023-04-12 — End: 2023-04-13
  Administered 2023-04-12: 81 mg via ORAL
  Filled 2023-04-11: qty 1

## 2023-04-11 MED ORDER — INSULIN GLARGINE-YFGN 100 UNIT/ML ~~LOC~~ SOLN
32.0000 [IU] | Freq: Every day | SUBCUTANEOUS | Status: DC
  Filled 2023-04-11: qty 0.32

## 2023-04-11 MED ORDER — SODIUM CHLORIDE 0.9 % WEIGHT BASED INFUSION
1.0000 mL/kg/h | INTRAVENOUS | Status: DC
  Administered 2023-04-11: 1 mL/kg/h via INTRAVENOUS

## 2023-04-11 MED ORDER — HYDROXYZINE HCL 25 MG PO TABS
25.0000 mg | ORAL_TABLET | Freq: Three times a day (TID) | ORAL | Status: DC | PRN
  Administered 2023-04-11 – 2023-04-17 (×12): 25 mg via ORAL
  Filled 2023-04-11 (×13): qty 1

## 2023-04-11 NOTE — TOC Initial Note (Signed)
 Transition of Care [facility]) - Initial/Assessment Note    Patient Details  Name: David [name] MRN: [id number] Date of Birth: Oct 14, 1960  Transition of Care [facility]) CM/SW Contact:    Waddell Barnie Rama, RN Phone Number: 04/11/2023, 12:08 PM  Clinical Narrative:                 From home with spouse, has PCP and insurance on file, states has no HH services in place at this time , has a cane at home.  States family member will transport them home at [employer] and family is support system, states gets medications from [facility] outpatient pharmacy or  [facility] clinic.  Pta self ambulatory. Per pt eval rec outpt PT, he states he is already going to [facility] and also goes to [facility] for aquatic therapy.  He states he does not need a shower chair.   Expected Discharge Plan: Home/Self Care Barriers to Discharge: Continued Medical Work up   Patient Goals and CMS Choice Patient states their goals for this hospitalization and ongoing recovery are:: return home   Choice offered to / list presented to : NA      Expected Discharge Plan and Services In-house Referral: NA Discharge Planning Services: CM Consult Post Acute Care Choice: NA Living arrangements for the past 2 months: Single Family Home                 DME Arranged: N/A DME Agency: NA       HH Arranged: NA          Prior Living Arrangements/Services Living arrangements for the past 2 months: Single Family Home Lives with:: Spouse Patient language and need for interpreter reviewed:: Yes Do you feel safe going back to the place where you live?: Yes      Need for Family Participation in Patient Care: Yes (Comment) Care giver support system in place?: Yes (comment) Current home services:  (OP therapy) Criminal Activity/Legal Involvement Pertinent to Current Situation/Hospitalization: No - Comment as needed  Activities of Daily Living      Permission Sought/Granted Permission sought to share information with : Case  Manager Permission granted to share information with : Yes, Verbal Permission Granted              Emotional Assessment Appearance:: Appears stated age Attitude/Demeanor/Rapport: Engaged Affect (typically observed): Appropriate Orientation: : Oriented to Place, Oriented to  Time, Oriented to Situation, Oriented to Self Alcohol / Substance Use: Not Applicable Psych Involvement: No (comment)  Admission diagnosis:  Persistent atrial fibrillation (HCC) [I48.19] Elevated troponin [R79.89] Acute respiratory failure with hypoxia (HCC) [J96.01] Acute decompensated heart failure (HCC) [I50.9] Acute on chronic congestive heart failure, unspecified heart failure type (HCC) [I50.9] Chronic bilateral low back pain, unspecified whether sciatica present [M54.50, G89.29] Patient Active Problem List   Diagnosis Date Noted   Acute decompensated heart failure (HCC) 04/10/2023   Acute systolic heart failure (HCC) 04/09/2023   Chronic pain syndrome 04/09/2023   Elevated troponin 04/09/2023   MRSA infection greater than 3 months ago 04/09/2023   Hypoglycemia 10/06/2022   Pleural effusion 09/29/2022   Chronic systolic CHF (congestive heart failure) (HCC) 09/29/2022   Acute urinary retention 09/29/2022   Anxiety 09/29/2022   Hardware complicating wound infection (HCC) 09/25/2022   Discitis of thoracic region 09/25/2022   Hypokalemia 09/07/2022   Acute toxic encephalopathy 09/07/2022   Acute postoperative anemia due to expected blood loss 09/07/2022   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   Adjustment disorder with mixed anxiety  and depressed mood 08/13/2022   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 08/08/2022   MRSA bacteremia 08/08/2022   AKI (acute kidney injury), ruled out 07/27/2022   Discitis thoracic region 07/27/2022   Bacteremia due to methicillin resistant Staphylococcus aureus 07/27/2022   Severe protein-calorie malnutrition (HCC) 07/26/2022   PVD (peripheral vascular disease)  (HCC) 07/26/2022   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   Bacteremia 07/15/2022   Acute hypoxic respiratory failure (HCC) 07/12/2022   Atelectasis 07/12/2022   Sepsis due to pneumonia (HCC) 07/12/2022   Gangrene of toe of right foot (HCC)    Sepsis (HCC) 08/29/2021   SIRS (systemic inflammatory response syndrome) (HCC) 08/29/2021   Hyponatremia 08/29/2021   Hypomagnesemia 08/29/2021   Pure hypercholesterolemia 06/17/2021   Pain of left hand 09/06/2019   Cellulitis 08/26/2019   Persistent atrial fibrillation (HCC) 08/26/2019   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) 10/18/2018   Transaminitis 10/17/2018   Hyperbilirubinemia 10/17/2018   Leukocytosis 10/17/2018   Sinusitis 10/17/2018   Chronic anticoagulation 09/30/2017   Smoker 09/30/2017   Cardiomyopathy (HCC) 09/30/2017   Sleep apnea suspected 09/30/2017   Essential hypertension 09/26/2017   Type 2 diabetes mellitus (HCC) 09/26/2017   Atrial fibrillation (HCC) 09/26/2017   PCP:  Delbert Clam, MD Pharmacy:   [facility] [address] [place] [address] Phone: [phone] Fax: [phone]  [facility] [address] [place] [address] Phone: [phone] Fax: [phone]  [facility] [address] [place] [address] Phone: [phone] Fax: [phone]  [id number] [place], [place] - [address] AT [facility] [address] & [facility] [address] [place] [address] Phone: [phone] Fax: [phone]     Social Drivers of Health (SDOH) Social History: SDOH Screenings   Food Insecurity: Food Insecurity Present (04/10/2023)  Housing: Unknown (04/10/2023)  Transportation Needs: Unmet Transportation Needs (04/10/2023)  Utilities: Not At Risk (04/10/2023)  Alcohol Screen: Low Risk  (03/01/2023)  Depression (PHQ2-9): High Risk  (12/29/2022)  Financial Resource Strain: Medium Risk (03/01/2023)  Physical Activity: Insufficiently Active (03/01/2023)  Social Connections: Unknown (03/01/2023)  Stress: Stress Concern Present (03/01/2023)  Tobacco Use: Medium Risk (04/09/2023)   SDOH Interventions: Food Insecurity Interventions: Inpatient TOC, Patient Declined Transportation Interventions: Inpatient TOC, Patient Declined   Readmission Risk Interventions    09/06/2021   12:07 PM  Readmission Risk Prevention Plan  Post Dischage Appt Complete  Medication Screening Complete  Transportation Screening Complete

## 2023-04-11 NOTE — Progress Notes (Signed)
 Pharmacists informed via secure chat of patient's unfractionated heparin level. Awaiting aPTT result.

## 2023-04-11 NOTE — Progress Notes (Signed)
 CSW met pt and introduced self/role. CSW offered pt resources and pt declined at this time.  Johnnette Gourd, MSW, LCSWA Transitions of Care [phone]

## 2023-04-11 NOTE — H&P (View-Only) (Signed)
   Patient Name: David [name] Date of Encounter: 04/11/2023 [facility] Cardiologist: Maude Emmer, MD   Interval Summary  .    No complaints, feel anxious  Vital Signs .    Vitals:   04/11/23 0048 04/11/23 0352 04/11/23 0552 04/11/23 0825  BP: 109/82 107/75  99/66  Pulse: 83 68    Resp: 17 17  15   Temp: 97.8 F (36.6 C) 97.8 F (36.6 C)  (!) 97.5 F (36.4 C)  TempSrc: Oral Oral  Oral  SpO2: 96% 99%    Weight:   79.8 kg   Height:        Intake/Output Summary (Last 24 hours) at 04/11/2023 1036 Last data filed at 04/11/2023 0900 Gross per 24 hour  Intake 657 ml  Output 2650 ml  Net -1993 ml      04/11/2023    5:52 AM 04/09/2023    3:38 PM 04/09/2023   10:54 AM  Last 3 Weights  Weight (lbs) 175 lb 14.8 oz 168 lb 14 oz 175 lb  Weight (kg) 79.8 kg 76.6 kg 79.379 kg      Telemetry/ECG    Rate controlled atrial fibrillation - Personally Reviewed  Physical Exam .   GEN: No acute distress.   Neck: No JVD Cardiac: Irreg Irreg no murmurs, rubs, or gallops.  Respiratory: Clear to auscultation bilaterally. GI: Soft, nontender, non-distended  MS: No edema  Assessment & Plan .     63 y.o. male with a hx of HFimpEF, DM, HTN, persistent atrial fibrillation, hx of MRSA bacteremia c/b osteomyelitis who was seen 04/10/2023 for the evaluation of abnormal EKG/elevated troponin/CHF at the request of Dr. Rosan.   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 Presented with acute onset shortness of breath morning of 1/5. BNP 419 and CXR with pulmonary edema -- s/p IV lasix , UOP 2L since admission and breathing improved -- symptoms are concerning for anginal equivalent  -- repeat echo pending read -- GDMT: BPs are soft, therefore no room for additional therapy at this time    NSTEMI Abnormal EKG -- EKG on admission is concerning for ACS with TWI in anterolateral leads and ST elevation an aVR -- hsTn flat 200s -- no chest pain PTA -- will plan for  tomorrow with improved Cr   Persistent atrial fibrillation -- hx of failed prior DCCV -- remains rate controlled, on dig 0.125mg  daily  -- hold Eliquis , on IV heparin  with plans for cardiac cath    HTN -- actually soft most recently -- hold meds for now   DM -- Hgb A1c 7.4 -- on lantus  and farxiga     CKD with AKI -- Cr 1.4 on admission up to 2.2, now improved to 1.8. Will order pre-cath fluids pending echo report -- follow up BMET in am -- could consider renal artery dopplers with labile BPs   Per Primary Chronic back pain Hx of MRSA bacteremia     For questions or updates, please contact [facility] Please consult [email or web address] for contact info under        Signed, Manuelita Rummer, NP

## 2023-04-11 NOTE — Progress Notes (Addendum)
 PHARMACY - ANTICOAGULATION CONSULT NOTE  Pharmacy Consult for continuous infusion of unfractionated heparin  intravenously.  Indication: chest pain/ACS  No Known Allergies  Patient Measurements: Height: 6' (182.9 cm) Weight: 79.8 kg (175 lb 14.8 oz) IBW/kg (Calculated) : 77.6 Heparin  Dosing Weight: 76 kg.   Vital Signs: Temp: 97.5 F (36.4 C) (01/07 0825) Temp Source: Oral (01/07 0825) BP: 99/66 (01/07 0825) Pulse Rate: 68 (01/07 0352)  Labs: Recent Labs    04/09/23 1101 04/09/23 1314 04/09/23 1624 04/10/23 0225 04/11/23 0837  HGB 13.3  --   --  12.3* 12.7*  HCT 38.3*  --   --  36.4* 37.4*  PLT 194  --   --  182 189  APTT  --   --   --   --  101*  HEPARINUNFRC  --   --   --   --  >1.10*  CREATININE 1.42*  --   --  2.28* 1.89*  TROPONINIHS 210* 259* 253*  --   --     Estimated Creatinine Clearance: 45 mL/min (A) (by C-G formula based on SCr of 1.89 mg/dL (H)).   Medical History: Past Medical History:  Diagnosis Date   Cardiomyopathy (HCC)    a. EF 45% in 2019.   CHF (congestive heart failure) (HCC)    Chronic anticoagulation 09/30/2017   Diabetes mellitus type 2 in nonobese [facility])    Discitis of thoracic region 09/25/2022   Does not have health insurance    Essential hypertension 09/26/2017   Financial difficulties    H/O noncompliance with medical treatment, presenting hazards to health    Hardware complicating wound infection (HCC) 09/25/2022   Non-insulin  treated type 2 diabetes mellitus (HCC) 09/26/2017   Persistent atrial fibrillation (HCC)    Sleep apnea suspected 09/30/2017    Medications:  Scheduled:   acetaminophen   1,000 mg Oral Q6H   Chlorhexidine  Gluconate Cloth  6 each Topical Q0600   diclofenac  Sodium  4 g Topical QID   digoxin   125 mcg Oral Daily   doxycycline   100 mg Oral Q12H   famotidine   20 mg Oral Daily   fluticasone   2 spray Each Nare Daily   insulin  aspart  0-15 Units Subcutaneous TID WC   insulin  glargine-yfgn  28 Units  Subcutaneous Daily   lidocaine   1 patch Transdermal Q24H   melatonin  5 mg Oral QHS   mupirocin  ointment  1 Application Nasal BID   oxyCODONE   15 mg Oral Q12H   pregabalin   75 mg Oral QHS   rosuvastatin   20 mg Oral Daily   sodium chloride  flush  3-10 mL Intravenous Q12H    Assessment: Heparin  infusing with no problems noted by the bedside nurse at 1200 units/hr. Heparin  level was collected at 0837 hours and reported as >1.10 (target range 0.3 - 0.7 units/mL). The aPTT was in range at 101 seconds (target range of 66 - 102 seconds.) PLTC 189.  Goal of Therapy:  Suspecting a continued spurious elevation of the heparin  level by the DOAC last administered, coupled with his bump in Scr (prolonging the clearance of the DOAC) will use the aPTT as the current surrogate marker for efficacy and safety. Goal aPTT range is 66 - 102 seconds Monitor platelets by anticoagulation protocol: Yes   Plan:  Re-assess the aPTT in 6 hours at 1600 hours owing to age < 70 years and CrCl . 30 mL/min.  Monitor platelets by anticoagulation protocol.   Lynwood KATHEE Lites, PharmD, CPP 04/11/2023,10:08 AM

## 2023-04-11 NOTE — Progress Notes (Signed)
   Patient Name: David [name] Date of Encounter: 04/11/2023 [facility] Cardiologist: Maude Emmer, MD   Interval Summary  .    No complaints, feel anxious  Vital Signs .    Vitals:   04/11/23 0048 04/11/23 0352 04/11/23 0552 04/11/23 0825  BP: 109/82 107/75  99/66  Pulse: 83 68    Resp: 17 17  15   Temp: 97.8 F (36.6 C) 97.8 F (36.6 C)  (!) 97.5 F (36.4 C)  TempSrc: Oral Oral  Oral  SpO2: 96% 99%    Weight:   79.8 kg   Height:        Intake/Output Summary (Last 24 hours) at 04/11/2023 1036 Last data filed at 04/11/2023 0900 Gross per 24 hour  Intake 657 ml  Output 2650 ml  Net -1993 ml      04/11/2023    5:52 AM 04/09/2023    3:38 PM 04/09/2023   10:54 AM  Last 3 Weights  Weight (lbs) 175 lb 14.8 oz 168 lb 14 oz 175 lb  Weight (kg) 79.8 kg 76.6 kg 79.379 kg      Telemetry/ECG    Rate controlled atrial fibrillation - Personally Reviewed  Physical Exam .   GEN: No acute distress.   Neck: No JVD Cardiac: Irreg Irreg no murmurs, rubs, or gallops.  Respiratory: Clear to auscultation bilaterally. GI: Soft, nontender, non-distended  MS: No edema  Assessment & Plan .     63 y.o. male with a hx of HFimpEF, DM, HTN, persistent atrial fibrillation, hx of MRSA bacteremia c/b osteomyelitis who was seen 04/10/2023 for the evaluation of abnormal EKG/elevated troponin/CHF at the request of Dr. Rosan.   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 Presented with acute onset shortness of breath morning of 1/5. BNP 419 and CXR with pulmonary edema -- s/p IV lasix , UOP 2L since admission and breathing improved -- symptoms are concerning for anginal equivalent  -- repeat echo pending read -- GDMT: BPs are soft, therefore no room for additional therapy at this time    NSTEMI Abnormal EKG -- EKG on admission is concerning for ACS with TWI in anterolateral leads and ST elevation an aVR -- hsTn flat 200s -- no chest pain PTA -- will plan for  tomorrow with improved Cr   Persistent atrial fibrillation -- hx of failed prior DCCV -- remains rate controlled, on dig 0.125mg  daily  -- hold Eliquis , on IV heparin  with plans for cardiac cath    HTN -- actually soft most recently -- hold meds for now   DM -- Hgb A1c 7.4 -- on lantus  and farxiga     CKD with AKI -- Cr 1.4 on admission up to 2.2, now improved to 1.8. Will order pre-cath fluids pending echo report -- follow up BMET in am -- could consider renal artery dopplers with labile BPs   Per Primary Chronic back pain Hx of MRSA bacteremia     For questions or updates, please contact [facility] Please consult [email or web address] for contact info under        Signed, Manuelita Rummer, NP

## 2023-04-11 NOTE — Progress Notes (Signed)
 Mobility Specialist Progress Note:    04/11/23 1226  Mobility  Activity Ambulated with assistance in hallway  Level of Assistance Standby assist, set-up cues, supervision of patient - no hands on  Assistive Device Other (Comment) (IV Pole)  Distance Ambulated (ft) 500 ft  Activity Response Tolerated well  Mobility Referral Yes  Mobility visit 1 Mobility  Mobility Specialist Start Time (ACUTE ONLY) 1115  Mobility Specialist Stop Time (ACUTE ONLY) 1128  Mobility Specialist Time Calculation (min) (ACUTE ONLY) 13 min   Received pt in bed having no complaints and agreeable to mobility. Pt was asymptomatic throughout ambulation and returned to room w/o fault. Left seated EOB w/ call bell in reach and all needs met.   Thersia Minder Mobility Specialist  Please contact vis Secure Chat or  Rehab Office [phone]

## 2023-04-11 NOTE — Progress Notes (Signed)
 Informed by phlebotomist that patient refusing morning labs. Patient reports he will like to eat his breakfast while it is hot. Phlebotomist informed, states she will come back later.

## 2023-04-11 NOTE — Progress Notes (Signed)
 PHARMACY - ANTICOAGULATION CONSULT NOTE  Pharmacy Consult for continuous infusion of unfractionated heparin  intravenously.  Indication: chest pain/ACS  No Known Allergies  Patient Measurements: Height: 6' (182.9 cm) Weight: 79.8 kg (175 lb 14.8 oz) IBW/kg (Calculated) : 77.6 Heparin  Dosing Weight: 76 kg.   Vital Signs: Temp: 97.7 F (36.5 C) (01/07 1230) Temp Source: Oral (01/07 1230) BP: 127/79 (01/07 1230)  Labs: Recent Labs    04/09/23 1101 04/09/23 1314 04/09/23 1624 04/10/23 0225 04/11/23 0837 04/11/23 1608  HGB 13.3  --   --  12.3* 12.7*  --   HCT 38.3*  --   --  36.4* 37.4*  --   PLT 194  --   --  182 189  --   APTT  --   --   --   --  101* 77*  HEPARINUNFRC  --   --   --   --  >1.10*  --   CREATININE 1.42*  --   --  2.28* 1.89*  --   TROPONINIHS 210* 259* 253*  --   --   --     Estimated Creatinine Clearance: 45 mL/min (A) (by C-G formula based on SCr of 1.89 mg/dL (H)).   Medical History: Past Medical History:  Diagnosis Date   Cardiomyopathy (HCC)    a. EF 45% in 2019.   CHF (congestive heart failure) (HCC)    Chronic anticoagulation 09/30/2017   Diabetes mellitus type 2 in nonobese [facility])    Discitis of thoracic region 09/25/2022   Does not have health insurance    Essential hypertension 09/26/2017   Financial difficulties    H/O noncompliance with medical treatment, presenting hazards to health    Hardware complicating wound infection (HCC) 09/25/2022   Non-insulin  treated type 2 diabetes mellitus (HCC) 09/26/2017   Persistent atrial fibrillation (HCC)    Sleep apnea suspected 09/30/2017    Medications:  Scheduled:   acetaminophen   1,000 mg Oral Q6H   Chlorhexidine  Gluconate Cloth  6 each Topical Q0600   diclofenac  Sodium  4 g Topical QID   digoxin   125 mcg Oral Daily   doxycycline   100 mg Oral Q12H   famotidine   20 mg Oral Daily   fluticasone   2 spray Each Nare Daily   insulin  aspart  0-15 Units Subcutaneous TID WC   [START ON 04/12/2023]  insulin  glargine-yfgn  32 Units Subcutaneous Daily   lidocaine   1 patch Transdermal Q24H   melatonin  5 mg Oral QHS   mupirocin  ointment  1 Application Nasal BID   oxyCODONE   15 mg Oral Q12H   pregabalin   75 mg Oral QHS   rosuvastatin   20 mg Oral Daily   sodium chloride  flush  3-10 mL Intravenous Q12H    Assessment: Heparin  infusing with no problems noted by the bedside nurse at 1200 units/hr. Heparin  level was collected at 0837 hours and reported as >1.10 (target range 0.3 - 0.7 units/mL). The aPTT was in range at 101 seconds (target range of 66 - 102 seconds.) PLTC 189.  1/7 PM: aPTT 77 (therapeutic) on heparin  1200 units/hr. No issues with the infusion or bleeding reported per RN.   Goal of Therapy:  Heparin  level 0.3-0.7 aPTT 66-102 seconds Suspecting a continued spurious elevation of the heparin  level by the DOAC last administered, coupled with his bump in Scr (prolonging the clearance of the DOAC) will use the aPTT as the current surrogate marker for efficacy and safety. Goal aPTT range is 66 - 102 seconds Monitor  platelets by anticoagulation protocol: Yes   Plan:  Continue heparin  drip at 1200 units/hr Check aPTT and heparin  level daily F/u plans for [facility] after cath tomorrow  Rocky Slade, PharmD, BCPS 04/11/2023,4:41 PM  Please check AMION for all [facility] Pharmacy phone numbers After 10:00 PM, call Main Pharmacy [phone]

## 2023-04-11 NOTE — Progress Notes (Signed)
 Consent signed for procedure tomorrow and placed in chart.

## 2023-04-11 NOTE — Plan of Care (Signed)
   Problem: Education: Goal: Knowledge of General Education information will improve Description: Including pain rating scale, medication(s)/side effects and non-pharmacologic comfort measures Outcome: Progressing   Problem: Clinical Measurements: Goal: Will remain free from infection Outcome: Progressing   Problem: Clinical Measurements: Goal: Diagnostic test results will improve Outcome: Progressing   Problem: Clinical Measurements: Goal: Respiratory complications will improve Outcome: Progressing

## 2023-04-11 NOTE — Progress Notes (Addendum)
 Summary: David [name] is a 63 y.o. 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   Hospital Day: 2  Subjective:  Patient seen and evaluated while resting in bed. He reports he feels about the same as yesterday. He denies CP or SOB. States he feels anxious about his health status and requests additional medication for anxiety; he reports the medicine given to him last night helped his anxiety.   Objective:  Vital signs in last 24 hours: Vitals:   04/11/23 0352 04/11/23 0552 04/11/23 0825 04/11/23 1230  BP: 107/75  99/66 127/79  Pulse: 68     Resp: 17  15 18   Temp: 97.8 F (36.6 C)  (!) 97.5 F (36.4 C) 97.7 F (36.5 C)  TempSrc: Oral  Oral Oral  SpO2: 99%     Weight:  79.8 kg    Height:       Physical Exam General: NAD, pleasant, cooperative Cardiovascular: irregularly irregular rhythm, normal rate, JVD present Respiratory: normal work of breathing, clear to auscultation bilaterally  Abdominal: soft, non-tender Extremities: no peripheral edema noted on exam  Weight change: 0.42 kg  Intake/Output Summary (Last 24 hours) at 04/11/2023 1557 Last data filed at 04/11/2023 1502 Gross per 24 hour  Intake 1003.08 ml  Output 2450 ml  Net -1446.92 ml   Pertinent Labs:    Latest Ref Rng & Units 04/11/2023    8:37 AM 04/10/2023    2:25 AM 04/09/2023   11:01 AM  CBC  WBC 4.0 - 10.5 K/uL 5.3  5.9  12.3   Hemoglobin 13.0 - 17.0 g/dL 87.2  87.6  86.6   Hematocrit 39.0 - 52.0 % 37.4  36.4  38.3   Platelets 150 - 400 K/uL 189  182  194       Latest Ref Rng & Units 04/11/2023    8:37 AM 04/10/2023    2:25 AM 04/09/2023   11:01 AM  CMP  Glucose 70 - 99 mg/dL 741  728  858   BUN 8 - 23 mg/dL 41  31  26   Creatinine 0.61 - 1.24 mg/dL 8.10  7.71   8.57   Sodium 135 - 145 mmol/L 132  133  134   Potassium 3.5 - 5.1 mmol/L 4.5  4.4  3.8   Chloride 98 - 111 mmol/L 101  101  102   CO2 22 - 32 mmol/L 23  22  22    Calcium  8.9 - 10.3 mg/dL 9.2  8.8  9.4   Total Protein 6.5 - 8.1 g/dL   6.6   Total Bilirubin 0.0 - 1.2 mg/dL   1.5   Alkaline Phos 38 - 126 U/L   75   AST 15 - 41 U/L   16   ALT 0 - 44 U/L   15    Echo:  IMPRESSIONS   1. Left ventricular ejection fraction, by estimation, is 65 to 70%. The  left ventricle has normal function. The left ventricle has no regional  wall motion abnormalities. There is mild concentric left ventricular  hypertrophy. Left ventricular diastolic  function could not be evaluated.   2. Right ventricular systolic function is mildly reduced. The right  ventricular size is mildly enlarged. Tricuspid regurgitation signal is  inadequate for assessing  PA pressure.   3. Left atrial size was moderately dilated.   4. The mitral valve is grossly normal. Mild mitral valve regurgitation. No evidence of mitral stenosis.   5. The aortic valve is tricuspid. Aortic valve regurgitation is not visualized. No aortic stenosis is present.   6. The inferior vena cava is dilated in size with <50% respiratory variability, suggesting right atrial pressure of 15 mmHg.   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 JVD still present on exam. No peripheral edema or difficulty breathing. He was evaluated while resting on room air. Will continue strict I/Os and home digoxin . Continue holding  home coreg  and Entresto . Continue holding lasix  per cardiology. Echo ordered by cardiology and completed this AM. Echo revealed left ventricular ejection fraction, by estimation, 65 to 70% and that the left ventricle has no regional wall motion abnormalities. Appreciate cardiology recommendations.   Paroxsymal Atrial fibrillation  Previously on home Eliquis . Cardiology recommended switching to IV heparin  as patient prepares for cardiac catheterization. Appreciate cardiology recommendations.  NSTEMI Cardiology evaluated patient and recommended cardiac catheterization given acute decompensated heart failure with abnormal EKG suggestive of lateral subendocardial ischemia versus infarct. Cardiac catheterization planned for tomorrow dependent on renal function. Creatine decreased to 1.89 today from 2.28 yesterday. Appreciate cardiology recommendations.   Anxiety Suspect new anxiety 2/2 to his first hospitalized heart failure exacerbation and recent cardiac discussions. Given Atarax  appeared to help with patient's anxiety last night, one dose of Atarax  25 mg ordered this AM.   T2DM with hyperglycemia CBGs stable overnight (180s-150s). CBG this AM 258. Home Semglee  increased to 28 units yesterday from home 27 units given elevated CBGs. Will increase Semglee  to 32 units and continue CBG monitoring.  Chronic Back Pain  Stable. Continue oxycodone  10 mg q12h while hospitalized and home robaxin  750 mg BID PRN and lyrica  75 mg daily.   Hx of MRSA bacteremia  Stable. Continue home doxycycline  100 mg BID for chronic infection suppression.   GERD  Stable. Continue famotidine  to 20 mg daily.   Diet: Regular VTE: IV Heparin   IVF: None Code: Full   Dispo: Anticipate discharge in 1-2 days.   LOS: 1 day   Saunders Damien MATSU, Medical Student 04/11/2023, 3:57 PM

## 2023-04-11 NOTE — Plan of Care (Signed)

## 2023-04-11 NOTE — Inpatient Diabetes Management (Signed)
 Inpatient Diabetes Program Recommendations  AACE/ADA: New Consensus Statement on Inpatient Glycemic Control (2015)  Target Ranges:  Prepandial:   less than 140 mg/dL      Peak postprandial:   less than 180 mg/dL (1-2 hours)      Critically ill patients:  140 - 180 mg/dL   Lab Results  Component Value Date   GLUCAP 222 (H) 04/11/2023   HGBA1C 7.4 (H) 04/09/2023    Review of Glycemic Control  Latest Reference Range & Units 04/10/23 16:14 04/10/23 21:25 04/10/23 22:00 04/11/23 05:47 04/11/23 12:31  Glucose-Capillary 70 - 99 mg/dL 683 (H) 841 (H) 845 (H) 184 (H) 222 (H)  (H): Data is abnormally high Diabetes history: Type 2 DM Outpatient Diabetes medications: Farxiga  10 mg every day, Lantus  27 units QD Current orders for Inpatient glycemic control:   Inpatient Diabetes Program Recommendations:    Consider: - Changing diet to carb modified - Increasing Semglee  to 32 units QD  Thanks, Tinnie Minus, MSN, RNC-OB Diabetes Coordinator [phone] (8a-5p)

## 2023-04-12 ENCOUNTER — Encounter (HOSPITAL_COMMUNITY)
Admission: EM | Disposition: A | Discharge status: Skilled Nursing Facility | Payer: Self-pay | Source: Home / Self Care | Attending: Surgery

## 2023-04-12 ENCOUNTER — Other Ambulatory Visit (HOSPITAL_COMMUNITY): Payer: Self-pay

## 2023-04-12 DIAGNOSIS — I25700 Atherosclerosis of coronary artery bypass graft(s), unspecified, with unstable angina pectoris: Secondary | ICD-10-CM

## 2023-04-12 DIAGNOSIS — E1151 Type 2 diabetes mellitus with diabetic peripheral angiopathy without gangrene: Secondary | ICD-10-CM | POA: Diagnosis not present

## 2023-04-12 DIAGNOSIS — I2510 Atherosclerotic heart disease of native coronary artery without angina pectoris: Secondary | ICD-10-CM

## 2023-04-12 DIAGNOSIS — I25810 Atherosclerosis of coronary artery bypass graft(s) without angina pectoris: Secondary | ICD-10-CM | POA: Diagnosis present

## 2023-04-12 DIAGNOSIS — I5033 Acute on chronic diastolic (congestive) heart failure: Secondary | ICD-10-CM

## 2023-04-12 DIAGNOSIS — I340 Nonrheumatic mitral (valve) insufficiency: Secondary | ICD-10-CM | POA: Diagnosis not present

## 2023-04-12 DIAGNOSIS — I214 Non-ST elevation (NSTEMI) myocardial infarction: Secondary | ICD-10-CM

## 2023-04-12 DIAGNOSIS — I5021 Acute systolic (congestive) heart failure: Secondary | ICD-10-CM | POA: Diagnosis not present

## 2023-04-12 DIAGNOSIS — Z794 Long term (current) use of insulin: Secondary | ICD-10-CM | POA: Diagnosis not present

## 2023-04-12 HISTORY — PX: LEFT HEART CATH AND CORONARY ANGIOGRAPHY: CATH118249

## 2023-04-12 LAB — CBC
HCT: 35.2 % — ABNORMAL LOW (ref 39.0–52.0)
Hemoglobin: 12.3 g/dL — ABNORMAL LOW (ref 13.0–17.0)
MCH: 33 pg (ref 26.0–34.0)
MCHC: 34.9 g/dL (ref 30.0–36.0)
MCV: 94.4 fL (ref 80.0–100.0)
Platelets: 199 10*3/uL (ref 150–400)
RBC: 3.73 MIL/uL — ABNORMAL LOW (ref 4.22–5.81)
RDW: 12.8 % (ref 11.5–15.5)
WBC: 5.6 10*3/uL (ref 4.0–10.5)
nRBC: 0 % (ref 0.0–0.2)

## 2023-04-12 LAB — GLUCOSE, CAPILLARY
Glucose-Capillary: 147 mg/dL — ABNORMAL HIGH (ref 70–99)
Glucose-Capillary: 101 mg/dL — ABNORMAL HIGH (ref 70–99)
Glucose-Capillary: 220 mg/dL — ABNORMAL HIGH (ref 70–99)
Glucose-Capillary: 143 mg/dL — ABNORMAL HIGH (ref 70–99)

## 2023-04-12 LAB — BASIC METABOLIC PANEL WITH GFR
Anion gap: 8 (ref 5–15)
BUN: 36 mg/dL — ABNORMAL HIGH (ref 8–23)
CO2: 21 mmol/L — ABNORMAL LOW (ref 22–32)
Calcium: 8.9 mg/dL (ref 8.9–10.3)
Chloride: 106 mmol/L (ref 98–111)
Creatinine, Ser: 1.62 mg/dL — ABNORMAL HIGH (ref 0.61–1.24)
GFR, Estimated: 48 mL/min — ABNORMAL LOW
Glucose, Bld: 182 mg/dL — ABNORMAL HIGH (ref 70–99)
Potassium: 4.5 mmol/L (ref 3.5–5.1)
Sodium: 135 mmol/L (ref 135–145)

## 2023-04-12 LAB — APTT: aPTT: 76 s — ABNORMAL HIGH (ref 24–36)

## 2023-04-12 LAB — HEPARIN LEVEL (UNFRACTIONATED): Heparin Unfractionated: 0.68 [IU]/mL (ref 0.30–0.70)

## 2023-04-12 SURGERY — LEFT HEART CATH AND CORONARY ANGIOGRAPHY
Anesthesia: LOCAL

## 2023-04-12 MED ORDER — HEPARIN SODIUM (PORCINE) 1000 UNIT/ML IJ SOLN
INTRAMUSCULAR | Status: AC
  Filled 2023-04-12: qty 10

## 2023-04-12 MED ORDER — FENTANYL CITRATE (PF) 100 MCG/2ML IJ SOLN
INTRAMUSCULAR | Status: DC | PRN
  Administered 2023-04-12: 50 ug via INTRAVENOUS

## 2023-04-12 MED ORDER — MIDAZOLAM HCL 2 MG/2ML IJ SOLN
INTRAMUSCULAR | Status: AC
  Filled 2023-04-12: qty 2

## 2023-04-12 MED ORDER — VERAPAMIL HCL 2.5 MG/ML IV SOLN
INTRAVENOUS | Status: AC
  Filled 2023-04-12: qty 2

## 2023-04-12 MED ORDER — HEPARIN (PORCINE) 25000 UT/250ML-% IV SOLN
1350.0000 [IU]/h | INTRAVENOUS | Status: DC
  Administered 2023-04-12: 1200 [IU]/h via INTRAVENOUS

## 2023-04-12 MED ORDER — LIDOCAINE HCL (PF) 1 % IJ SOLN
INTRAMUSCULAR | Status: AC
  Filled 2023-04-12: qty 30

## 2023-04-12 MED ORDER — INSULIN GLARGINE-YFGN 100 UNIT/ML ~~LOC~~ SOLN
32.0000 [IU] | Freq: Every day | SUBCUTANEOUS | Status: DC
  Administered 2023-04-12 – 2023-04-17 (×6): 32 [IU] via SUBCUTANEOUS
  Filled 2023-04-12 (×7): qty 0.32

## 2023-04-12 MED ORDER — LABETALOL HCL 5 MG/ML IV SOLN: 10.0000 mg | INTRAVENOUS | Status: AC | PRN

## 2023-04-12 MED ORDER — SODIUM CHLORIDE 0.9 % IV SOLN
INTRAVENOUS | Status: AC
Start: 2023-04-12 — End: 2023-04-12

## 2023-04-12 MED ORDER — SODIUM CHLORIDE 0.9% FLUSH: 3.0000 mL | INTRAVENOUS | Status: DC | PRN

## 2023-04-12 MED ORDER — LIDOCAINE HCL (PF) 1 % IJ SOLN
INTRAMUSCULAR | Status: DC | PRN
  Administered 2023-04-12: 2 mL via INTRADERMAL

## 2023-04-12 MED ORDER — FENTANYL CITRATE (PF) 100 MCG/2ML IJ SOLN
INTRAMUSCULAR | Status: AC
  Filled 2023-04-12: qty 2

## 2023-04-12 MED ORDER — HYDRALAZINE HCL 20 MG/ML IJ SOLN: 10.0000 mg | INTRAMUSCULAR | Status: AC | PRN

## 2023-04-12 MED ORDER — SERTRALINE HCL 50 MG PO TABS
25.0000 mg | ORAL_TABLET | Freq: Every day | ORAL | Status: DC
  Administered 2023-04-12: 25 mg via ORAL
  Filled 2023-04-12: qty 1

## 2023-04-12 MED ORDER — HEPARIN SODIUM (PORCINE) 1000 UNIT/ML IJ SOLN
INTRAMUSCULAR | Status: DC | PRN
  Administered 2023-04-12: 4000 [IU] via INTRAVENOUS

## 2023-04-12 MED ORDER — LIDOCAINE 5 % EX PTCH
1.0000 | MEDICATED_PATCH | CUTANEOUS | Status: DC
  Administered 2023-04-15 – 2023-04-17 (×2): 1 via TRANSDERMAL
  Filled 2023-04-12 (×4): qty 1

## 2023-04-12 MED ORDER — MIDAZOLAM HCL 2 MG/2ML IJ SOLN
INTRAMUSCULAR | Status: DC | PRN
  Administered 2023-04-12: 2 mg via INTRAVENOUS

## 2023-04-12 MED ORDER — SODIUM CHLORIDE 0.9% FLUSH
3.0000 mL | Freq: Two times a day (BID) | INTRAVENOUS | Status: DC
  Administered 2023-04-12 – 2023-04-17 (×10): 3 mL via INTRAVENOUS

## 2023-04-12 MED ORDER — IOHEXOL 350 MG/ML SOLN
INTRAVENOUS | Status: DC | PRN
  Administered 2023-04-12: 35 mL

## 2023-04-12 MED ORDER — HEPARIN (PORCINE) IN NACL 1000-0.9 UT/500ML-% IV SOLN
INTRAVENOUS | Status: DC | PRN
  Administered 2023-04-12 (×2): 500 mL

## 2023-04-12 MED ORDER — VERAPAMIL HCL 2.5 MG/ML IV SOLN
INTRAVENOUS | Status: DC | PRN
  Administered 2023-04-12: 10 mL via INTRA_ARTERIAL

## 2023-04-12 MED ORDER — SODIUM CHLORIDE 0.9 % IV SOLN
250.0000 mL | INTRAVENOUS | Status: DC | PRN
Start: 2023-04-12 — End: 2023-04-13

## 2023-04-12 SURGICAL SUPPLY — 8 items
BAG SNAP BAND KOVER 36X36 (MISCELLANEOUS) IMPLANT
CATH 5FR JL3.5 JR4 ANG PIG MP (CATHETERS) IMPLANT
DEVICE RAD COMP TR BAND LRG (VASCULAR PRODUCTS) IMPLANT
GLIDESHEATH SLEND SS 6F .021 (SHEATH) IMPLANT
GUIDEWIRE INQWIRE 1.5J.035X260 (WIRE) IMPLANT
INQWIRE 1.5J .035X260CM (WIRE) ×1 IMPLANT
PACK CARDIAC CATHETERIZATION (CUSTOM PROCEDURE TRAY) ×2 IMPLANT
SET ATX-X65L (MISCELLANEOUS) IMPLANT

## 2023-04-12 NOTE — Consult Note (Addendum)
 [address]       [place] [address]             [phone]        HYLAND MOLLENKOPF [facility] Medical Record #[id number] Date of Birth: 10-Dec-1960  Referring: No ref. provider found Primary Care: Delbert Clam, MD Primary Cardiologist:Peter Delford, MD  Chief Complaint:    Chief Complaint  Patient presents with   Shortness of Breath    History of Present Illness:    We are asked to see for a 63 year old male cardiothoracic surgical consultation for consideration of coronary artery surgical revascularization.  He has multiple cardiac risk factors including a history of congestive heart failure, diabetes mellitus type 2, hypertension, persistent atrial fibrillation with failed previous DCCV, significant peripheral vascular arterial occlusive disease and possible sleep apnea.  Additionally, he is a former smoker but quit approximately 5 years ago.  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  He has a history of extensive fine surgery due to a prior course of MRSA osteomyelitis.  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  He is on doxycycline  for chronic infection suppression.  He is on chronic anticoagulation with Eliquis .  His congestive heart failure is previously modified as moderate with an ejection fraction of 45%.  He presented to the emergency room after approximately 2 days of progressive shortness of breath.  He has no previous history of heart failure exacerbations requiring admission.  He has some degree of orthopnea at baseline.  In the emergency room he was started on a course of diuresis.  He did have elevation of his high-sensitivity troponins ruling in for non-STEMI.  proBNP was also noted to be elevated.  Cardiology consultation was obtained for further evaluation and management.  Of note he did have a echocardiogram done in April 2024 which at that time revealed normal LVEF with  regional impaired findings noted in 2020.  He did have acute renal insufficiency on presentation with creatinine peak of 2.28 and it is trending lower with most recent value today 1.62.   Repeat Echocardiogram done on 04/11/2023 showed ejection fraction at 65 to 70% with normal left ventricular function.  Right ventricular systolic function was mildly reduced.  He had mild mitral valvular regurgitation without stenosis.  There were no other valvular abnormalities.  He was medically stabilized and underwent cardiac catheterization on today's date where he was found to have severe three-vessel coronary artery disease.  He has made significant clinical improvement in his pulmonary symptoms since his admission.    Current Activity/ Functional Status: Patient was not independent with mobility/ambulation, transfers, ADL's, IADL's.   Zubrod Score: At the time of surgery this patient's most appropriate activity status/level should be described as: []     0    Normal activity, no symptoms [x]     1    Restricted in physical strenuous activity but ambulatory, able to do out light work []     2    Ambulatory and capable of self care, unable to do work activities, up and about                 more than 50%  Of the time                            []     3  Only limited self care, in bed greater than 50% of waking hours []     4    Completely disabled, no self care, confined to bed or chair []     5    Moribund  Past Medical History:  Diagnosis Date   Cardiomyopathy (HCC)    a. EF 45% in 2019.   CHF (congestive heart failure) (HCC)    Chronic anticoagulation 09/30/2017   Diabetes mellitus type 2 in nonobese [facility])    Discitis of thoracic region 09/25/2022   Does not have health insurance    Essential hypertension 09/26/2017   Financial difficulties    H/O noncompliance with medical treatment, presenting hazards to health    Hardware complicating wound infection (HCC) 09/25/2022   Non-insulin  treated type 2  diabetes mellitus (HCC) 09/26/2017   Persistent atrial fibrillation (HCC)    Sleep apnea suspected 09/30/2017    Past Surgical History:  Procedure Laterality Date   ABDOMINAL AORTOGRAM W/LOWER EXTREMITY N/A 09/02/2021   Procedure: ABDOMINAL AORTOGRAM W/LOWER EXTREMITY;  Surgeon: Serene Gaile ORN, MD;  Location: [facility] INVASIVE CV LAB;  Service: Cardiovascular;  Laterality: N/A;   ABDOMINAL AORTOGRAM W/LOWER EXTREMITY N/A 12/28/2021   Procedure: ABDOMINAL AORTOGRAM W/LOWER EXTREMITY;  Surgeon: Serene Gaile ORN, MD;  Location: [facility];  Service: Cardiovascular;  Laterality: N/A;   ABDOMINAL AORTOGRAM W/LOWER EXTREMITY N/A 07/28/2022   Procedure: ABDOMINAL AORTOGRAM W/LOWER EXTREMITY;  Surgeon: Gretta Lonni PARAS, MD;  Location: [facility];  Service: Cardiovascular;  Laterality: N/A;   AMPUTATION Right 09/04/2021   Procedure: 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;  Surgeon: Harden Jerona GAILS, MD;  Location: [facility] OR;  Service: Orthopedics;  Laterality: Right;   AMPUTATION Right 08/02/2022   Procedure: AMPUTATION RAY 5TH METATARSAL OF RIGHT FOOT;  Surgeon: Barton Drape, MD;  Location: [facility] OR;  Service: Orthopedics;  Laterality: Right;   APPENDECTOMY  1971   BONE BIOPSY Right 08/02/2022   Procedure: BONE BIOPSY OF 5TH METATARSAL RIGHT FOOT;  Surgeon: Barton Drape, MD;  Location: [facility] OR;  Service: Orthopedics;  Laterality: Right;   CARDIOVERSION N/A 09/28/2017   Procedure: CARDIOVERSION;  Surgeon: Francyne Headland, MD;  Location: [facility] ENDOSCOPY;  Service: Cardiovascular;  Laterality: N/A;   IRRIGATION AND DEBRIDEMENT FOOT  08/02/2022   Procedure: IRRIGATION AND DEBRIDEMENT RIGHT 5TH METATARSAL FOOT;  Surgeon: Barton Drape, MD;  Location: [facility] OR;  Service: Orthopedics;;   LAMINECTOMY WITH POSTERIOR LATERAL ARTHRODESIS LEVEL 4 N/A 08/30/2022   Procedure: THORACIC FIVE-THORACIC ELEVEN FUSION, THORACIC EIGHT TRANSPEDICULAR DECOMPRESSION AND PARTIAL CORPECTOMY with O-Arm;  Surgeon: Debby Dorn MATSU, MD;  Location: [facility] OR;  Service: Neurosurgery;  Laterality: N/A;   PERIPHERAL VASCULAR BALLOON ANGIOPLASTY  09/02/2021   Procedure: PERIPHERAL VASCULAR BALLOON ANGIOPLASTY;  Surgeon: Serene Gaile ORN, MD;  [facility]: [facility];  Service: Cardiovascular;;   PERIPHERAL VASCULAR BALLOON ANGIOPLASTY  12/28/2021   Procedure: PERIPHERAL VASCULAR BALLOON ANGIOPLASTY;  Surgeon: Serene Gaile ORN, MD;  [facility]: [facility];  Service: Cardiovascular;;  Posterior Tibial PTA only   TEE WITHOUT CARDIOVERSION N/A 09/28/2017   Procedure: TRANSESOPHAGEAL ECHOCARDIOGRAM (TEE);  Surgeon: Francyne Headland, MD;  Location: [facility] ENDOSCOPY;  Service: Cardiovascular;  Laterality: N/A;   TEE WITHOUT CARDIOVERSION N/A 09/03/2021   Procedure: TRANSESOPHAGEAL ECHOCARDIOGRAM (TEE);  Surgeon: Raford Riggs, MD;  Location: [facility] ENDOSCOPY;  Service: Cardiovascular;  Laterality: N/A;   TEE WITHOUT CARDIOVERSION N/A 07/15/2022   Procedure: TRANSESOPHAGEAL ECHOCARDIOGRAM;  Surgeon: Francyne Headland, MD;  Location: [facility];  Service: Cardiovascular;  Laterality: N/A;    Social History   Tobacco Use  Smoking Status Former   Current packs/day: 0.00   Types: Cigarettes   Start date: 03/19/2003   Quit date: 03/18/2018   Years since quitting: 5.0  Smokeless Tobacco Never  Tobacco Comments   09/26/2017 2-3 cigarettes/month now    Social History   Substance and Sexual Activity  Alcohol Use Not Currently   Alcohol/week: 3.0 standard drinks of alcohol   Types: 3 Cans of beer per week   Comment: States he quit drinking 8-9 months ago     No Known Allergies  Current Facility-Administered Medications  Medication Dose Route Frequency Provider Last Rate Last Admin   0.9 %  sodium chloride  infusion   Intravenous Continuous Verlin Bruckner D, MD       0.9 %  sodium chloride  infusion  250 mL Intravenous PRN Verlin Bruckner BIRCH, MD       acetaminophen  (TYLENOL ) tablet 1,000 mg   1,000 mg Oral Q6H Verlin Bruckner BIRCH, MD   1,000 mg at 04/12/23 1210   Chlorhexidine  Gluconate Cloth 2 % PADS 6 each  6 each Topical Q0600 Verlin Bruckner BIRCH, MD   6 each at 04/12/23 0607   diclofenac  Sodium (VOLTAREN ) 1 % topical gel 4 g  4 g Topical QID Verlin Bruckner BIRCH, MD   4 g at 04/11/23 1034   digoxin  (LANOXIN ) tablet 125 mcg  125 mcg Oral Daily Verlin Bruckner BIRCH, MD   125 mcg at 04/12/23 0920   doxycycline  (VIBRA -TABS) tablet 100 mg  100 mg Oral Q12H Verlin Bruckner BIRCH, MD   100 mg at 04/12/23 9078   famotidine  (PEPCID ) tablet 20 mg  20 mg Oral Daily Verlin Bruckner BIRCH, MD   20 mg at 04/12/23 9078   fluticasone  (FLONASE ) 50 MCG/ACT nasal spray 2 spray  2 spray Each Nare Daily Verlin Bruckner BIRCH, MD   2 spray at 04/12/23 9071   hydrALAZINE  (APRESOLINE ) injection 10 mg  10 mg Intravenous Q20 Min PRN Verlin Bruckner BIRCH, MD       hydrOXYzine  (ATARAX ) tablet 25 mg  25 mg Oral TID PRN Verlin Bruckner BIRCH, MD   25 mg at 04/12/23 1209   insulin  aspart (novoLOG ) injection 0-15 Units  0-15 Units Subcutaneous TID WC Verlin Bruckner BIRCH, MD   2 Units at 04/12/23 9370   insulin  glargine-yfgn (SEMGLEE ) injection 32 Units  32 Units Subcutaneous QHS Verlin Bruckner BIRCH, MD       labetalol  (NORMODYNE ) injection 10 mg  10 mg Intravenous Q10 min PRN Verlin Bruckner BIRCH, MD       lidocaine  (LIDODERM ) 5 % 1 patch  1 patch Transdermal Q24H Verlin Bruckner BIRCH, MD   1 patch at 04/11/23 1714   melatonin tablet 5 mg  5 mg Oral QHS Verlin Bruckner BIRCH, MD   5 mg at 04/11/23 2055   methocarbamol  (ROBAXIN ) tablet 750 mg  750 mg Oral BID PRN Verlin Bruckner BIRCH, MD   750 mg at 04/10/23 0754   mupirocin  ointment (BACTROBAN ) 2 % 1 Application  1 Application Nasal BID Verlin Bruckner BIRCH, MD   1 Application at 04/12/23 9070   oxyCODONE  (OXYCONTIN ) 12 hr tablet 15 mg  15 mg Oral Q12H Verlin Bruckner BIRCH, MD   15 mg at 04/12/23 9078   pregabalin   (LYRICA ) capsule 75 mg  75 mg Oral QHS Verlin Bruckner BIRCH, MD   75 mg at 04/11/23 2055   rosuvastatin  (CRESTOR ) tablet 20 mg  20 mg Oral  Daily Verlin Lonni BIRCH, MD   20 mg at 04/12/23 9078   sodium chloride  flush (NS) 0.9 % injection 3 mL  3 mL Intravenous Q12H Verlin Lonni BIRCH, MD       sodium chloride  flush (NS) 0.9 % injection 3 mL  3 mL Intravenous PRN Verlin Lonni BIRCH, MD       sodium chloride  flush (NS) 0.9 % injection 3-10 mL  3-10 mL Intravenous Q12H Verlin Lonni BIRCH, MD   10 mL at 04/10/23 1022   sodium chloride  flush (NS) 0.9 % injection 3-10 mL  3-10 mL Intravenous PRN Verlin Lonni BIRCH, MD        Medications Prior to Admission  Medication Sig Dispense Refill Last Dose/Taking   apixaban  (ELIQUIS ) 5 MG TABS tablet Take 1 tablet (5 mg total) by mouth 2 (two) times daily. 60 tablet 1 04/09/2023 at  8:00 AM   carvedilol  (COREG ) 3.125 MG tablet Take 1 tablet (3.125 mg total) by mouth 2 (two) times daily. 180 tablet 1 04/09/2023 at  8:00 AM   dapagliflozin  propanediol (FARXIGA ) 10 MG TABS tablet Take 1 tablet (10 mg total) by mouth daily before breakfast. 90 tablet 1 04/09/2023 Morning   digoxin  (LANOXIN ) 0.125 MG tablet Take 1 tablet (125 mcg total) by mouth once daily. 30 tablet 1 04/09/2023 Morning   doxycycline  (VIBRA -TABS) 100 MG tablet Take 1 tablet (100 mg total) by mouth every 12 (twelve) hours. 180 tablet 3 04/09/2023 Morning   famotidine  (PEPCID ) 20 MG tablet Take 1 tablet (20 mg total) by mouth 2 times daily. 60 tablet 1 04/09/2023 Morning   fluticasone  (FLONASE ) 50 MCG/ACT nasal spray Place 2 sprays into both nostrils daily. 16 g 2 04/09/2023 Morning   furosemide  (LASIX ) 40 MG tablet Take 1/2 tablet (20 mg total) by mouth daily. 30 tablet 1 04/09/2023 Morning   HYDROcodone -acetaminophen  (NORCO) 10-325 MG tablet Take 1 tablet by mouth every 4 (four) hours as needed for pain. 30 tablet 0 Past Week   insulin  glargine (LANTUS  SOLOSTAR) 100 UNIT/ML Solostar Pen  Inject 27 Units into the skin daily. 30 mL 6 04/08/2023 Morning   methocarbamol  (ROBAXIN -750) 750 MG tablet Take 1 tablet (750 mg total) by mouth 2 (two) times daily as needed for muscle spasms. 60 tablet 0 04/08/2023 Morning   pregabalin  (LYRICA ) 75 MG capsule Take 1 capsule (75 mg total) by mouth at bedtime. 30 capsule 5 04/08/2023 Bedtime   rosuvastatin  (CRESTOR ) 20 MG tablet Take 1 tablet (20 mg total) by mouth daily. 90 tablet 1 04/09/2023 Morning   sacubitril -valsartan  (ENTRESTO ) 49-51 MG Take 1 tablet by mouth 2 (two) times daily. 60 tablet 1 04/09/2023 Morning   tamsulosin  (FLOMAX ) 0.4 MG CAPS capsule Take 1 capsule (0.4 mg total) by mouth daily after supper. 90 capsule 1 04/09/2023 Evening   terbinafine  (LAMISIL ) 250 MG tablet Take 1 tablet (250 mg total) by mouth daily. 90 tablet 1 04/09/2023 Morning   XTAMPZA  ER 9 MG C12A Take 1 capsule by mouth every 12 (twelve) hours.   04/08/2023 Evening   Accu-Chek Softclix Lancets lancets Use to check blood sugar 3 times daily. 100 each 6    Blood Glucose Monitoring Suppl (ACCU-CHEK GUIDE) w/Device KIT Use to check blood sugar 3 times daily. E11.9 1 kit 0    Continuous Glucose Receiver (DEXCOM G7 RECEIVER) DEVI Use as directed. 1 each 0    Continuous Glucose Sensor (DEXCOM G7 SENSOR) MISC Use as directed & change every 10 days. 3 each 12  glucose blood (ACCU-CHEK GUIDE) test strip Use to check blood sugar 3 times daily. 100 each 6    Insulin  Pen Needle (PEN NEEDLES) 32G X 4 MM MISC Use to inject insulin  once daily. 100 each 6    Misc. 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  HENT:  Positive for congestion.   Eyes:  Positive for blurred vision.  Respiratory:  Positive for shortness of breath and wheezing.   Cardiovascular:  Positive for chest pain.  Musculoskeletal:  Positive for back pain.  Neurological:  Positive for tingling and sensory change.  Psychiatric/Behavioral:  Positive for depression. The patient has insomnia.            Physical Exam: BP 116/73 (BP Location: Left Arm)   Pulse 82   Temp 97.6 F (36.4 C) (Oral)   Resp 20   Ht 6' (1.829 m)   Wt 80.7 kg   SpO2 98%   BMI 24.13 kg/m    General appearance: alert, cooperative, and no distress Head: Normocephalic, without obvious abnormality, atraumatic Neck: no adenopathy, no carotid bruit, supple, symmetrical, trachea midline, and thyroid not enlarged, symmetric, no tenderness/mass/nodules Lymph nodes: Cervical, supraclavicular, and axillary nodes normal. Resp: clear to auscultation bilaterally Back: + tenderness Cardio: regular rate and rhythm, S1, S2 normal, no murmur, click, rub or gallop and + JVD GI: soft, non-tender; bowel sounds normal; no masses,  no organomegaly Extremities: extremities normal, atraumatic, no cyanosis or edema and gross LE sensory changes, absentDP/PT pulses, femorals intact Neurologic: Grossly normal except peripheral neuropathy and some  weakness, gait not tested post cath bedrest  Diagnostic Studies & Laboratory data:     Recent Radiology Findings:   CARDIAC CATHETERIZATION Result Date: 04/12/2023   Ost RCA to Dist RCA lesion is 20% stenosed.   RPDA lesion is 99% stenosed.   Lat 1st Mrg lesion is 70% stenosed.   1st Mrg lesion is 95% stenosed.   Ost Cx to Prox Cx lesion is 90% stenosed.   Ost LAD to Prox LAD lesion is 50% stenosed.   Prox LAD to Mid LAD lesion is 99% stenosed.   Dist LAD lesion is 50% stenosed. The LAD is a large caliber vessel that courses to the apex. The proximal LAD has  moderate, heavily calcified stenosis. The mid LAD has a severe, heavily calcified stenosis. The proximal Circumflex is heavily calcified and has a severe stenosis just beyond the ostium. The large caliber obtuse marginal branch is heavily calcified and has severe disease in two distal branches.  The large dominant RCA is heavily calcified throughout the entire vessel. The small caliber PDA has severe disease. LVEDP=13 mmHg Recommendations: He has complex, heavily calcified multi-vessel CAD. I think that the best revascularization strategy will be bypass surgery in this diabetic male. I have reviewed the films with Dr. Ladona who agrees with this plan. CT surgery notified and will see him later today. Resume IV heparin  2 hours post sheath pull.   ECHOCARDIOGRAM COMPLETE  Result Date: 04/11/2023    ECHOCARDIOGRAM REPORT   Patient Name:   David [name] Date of Exam: 04/11/2023 Medical Rec #:  [id number]     Height:       72.0 in Accession #:    [id number]    Weight:       175.9 lb Date of Birth:  [birth year]      BSA:          2.018 m Patient Age:    62 years      BP:           107/75 mmHg Patient Gender: M             HR:           62 bpm. Exam Location:  Inpatient Procedure: 2D Echo, Color Doppler, Cardiac Doppler and Strain Analysis Indications:    R94.31 Abnormal EKG  History:        Patient has prior history of Echocardiogram examinations, most                 recent 07/13/2022. CHF and Cardiomyopathy, Arrythmias:Atrial                 Fibrillation; Risk Factors:Hypertension.  Sonographer:    Lanell Maduro Referring Phys: [phone] LINDSAY B ROBERTS IMPRESSIONS  1. Left ventricular ejection fraction, by estimation, is 65 to 70%. The left ventricle has normal function. The left ventricle has no regional wall motion abnormalities. There is mild concentric left ventricular hypertrophy. Left ventricular diastolic function could not be evaluated.  2. Right ventricular systolic function is mildly reduced. The right ventricular  size is mildly enlarged. Tricuspid regurgitation signal is inadequate for assessing PA pressure.  3. Left atrial size was moderately dilated.  4. The mitral valve is grossly normal. Mild mitral valve regurgitation. No evidence of mitral stenosis.  5. The aortic valve is tricuspid. Aortic valve regurgitation is not visualized. No aortic stenosis is present.  6. The inferior vena cava is dilated in size with <50% respiratory variability, suggesting right atrial pressure of 15 mmHg. Comparison(s): No significant change from prior study. FINDINGS  Left Ventricle: Left ventricular ejection fraction, by estimation, is 65 to 70%. The left ventricle has normal function. The left ventricle has no regional wall motion abnormalities. Global longitudinal strain performed but not reported based on interpreter judgement due to suboptimal tracking. The left ventricular internal cavity size was normal in size. There is mild concentric left ventricular hypertrophy. Left ventricular diastolic function could not be evaluated due to atrial fibrillation. Left ventricular diastolic function could not be evaluated. Right Ventricle: The right ventricular size is mildly enlarged. No increase in right ventricular wall thickness. Right ventricular systolic function is mildly reduced. Tricuspid regurgitation signal is inadequate for assessing PA pressure. Left Atrium: Left atrial size was moderately dilated. Right Atrium: Right atrial size was normal in size. Pericardium: There is no evidence of pericardial effusion. Mitral Valve: The mitral valve is grossly normal. Mild mitral valve regurgitation. No evidence of mitral valve stenosis. Tricuspid Valve: The tricuspid valve is grossly normal. Tricuspid valve regurgitation is mild . No evidence of tricuspid stenosis. Aortic Valve: The aortic valve is tricuspid. Aortic valve regurgitation is not visualized. No aortic stenosis is present. Pulmonic Valve: The pulmonic valve was grossly normal.  Pulmonic valve regurgitation is not visualized. No evidence of pulmonic stenosis. Aorta: The aortic root and ascending aorta are structurally normal, with no evidence of dilitation. Venous: The inferior vena cava is dilated in  size with less than 50% respiratory variability, suggesting right atrial pressure of 15 mmHg. IAS/Shunts: The atrial septum is grossly normal.  LEFT VENTRICLE PLAX 2D LVIDd:         4.60 cm     Diastology LVIDs:         2.60 cm     LV e' medial:    7.15 cm/s LV PW:         1.20 cm     LV E/e' medial:  14.5 LV IVS:        1.20 cm     LV e' lateral:   9.95 cm/s LVOT diam:     2.00 cm     LV E/e' lateral: 10.5 LV SV:         46 LV SV Index:   23 LVOT Area:     3.14 cm  LV Volumes (MOD) LV vol d, MOD A2C: 72.8 ml LV vol d, MOD A4C: 73.4 ml LV vol s, MOD A2C: 30.2 ml LV vol s, MOD A4C: 17.0 ml LV SV MOD A2C:     42.6 ml LV SV MOD A4C:     73.4 ml LV SV MOD BP:      49.2 ml RIGHT VENTRICLE            IVC RV Basal diam:  4.10 cm    IVC diam: 2.30 cm RV Mid diam:    2.70 cm RV S prime:     9.79 cm/s TAPSE (M-mode): 1.6 cm LEFT ATRIUM           Index        RIGHT ATRIUM           Index LA diam:      4.70 cm 2.33 cm/m   RA Area:     15.10 cm LA Vol (A2C): 39.1 ml 19.38 ml/m  RA Volume:   32.80 ml  16.26 ml/m LA Vol (A4C): 84.5 ml 41.88 ml/m  AORTIC VALVE LVOT Vmax:   83.90 cm/s LVOT Vmean:  57.200 cm/s LVOT VTI:    0.148 m  AORTA Ao Root diam: 3.50 cm Ao Asc diam:  3.30 cm MITRAL VALVE MV Area (PHT): 3.42 cm     SHUNTS MV Decel Time: 222 msec     Systemic VTI:  0.15 m MV E velocity: 104.00 cm/s  Systemic Diam: 2.00 cm Darryle Decent MD Electronically signed by Darryle Decent MD Signature Date/Time: 04/11/2023/10:39:58 AM    Final      I have independently reviewed the above radiologic studies and discussed with the patient   Recent Lab Findings: Lab Results  Component Value Date   WBC 5.6 04/12/2023   HGB 12.3 (L) 04/12/2023   HCT 35.2 (L) 04/12/2023   PLT 199 04/12/2023   GLUCOSE 182 (H)  04/12/2023   CHOL 199 01/09/2023   TRIG 235 (H) 01/09/2023   HDL 36 (L) 01/09/2023   LDLCALC 121 (H) 01/09/2023   ALT 15 04/09/2023   AST 16 04/09/2023   NA 135 04/12/2023   K 4.5 04/12/2023   CL 106 04/12/2023   CREATININE 1.62 (H) 04/12/2023   BUN 36 (H) 04/12/2023   CO2 21 (L) 04/12/2023   TSH 0.855 09/04/2022   INR 1.2 08/29/2022   HGBA1C 7.4 (H) 04/09/2023      Assessment / Plan: Severe three-vessel coronary artery disease in the setting of non-STEMI.  History of HFmrEF but most recent values by echocardiogram are within normal limits History of MRSA  sepsis with spinal infection requiring emergent surgery, thoracic discitis, he is still in physical therapy for this and continues to struggle in terms of pain control. 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  The surgeon will evaluate the patient and all relevant studies to determine timing and candidacy  I  spent 55 minutes counseling the patient face to face.   Lemond FORBES Cera, PA-C  04/12/2023 1:06 PM   Chart reviewed, patient examined, agree with above.  He presented with worsening shortness of breath and probably having some chest discomfort but vague about that. He ruled in for NSTEMI and echo shows normal LVEF with mild LVH, no significant valvular abnormality. Cath shows diffusely calcified coronary arteries with severe 3V disease. I agree that CABG is the best treatment for him but certainly not straightforward. Hopefully his distal PDA will be graftable. He has high grade stenoses in the distal large OM but these sub-branches are likely intramyocardial and may not be accessible. His LAD looks graftable but has 50% distal stenosis. His operative risk is increased due to poorly  controlled DM with Hgb A1c of 7.4, stage 3b CKD with creat up to 2.28 this admission, now 1.65. He was 1.28 in October. He has had severe MRSA infection with toe amputations, major spine surgery for discitis and osteomyelitis in May 2024 on chronic doxycycline  for suppression. He has been on Eliquis  for permanent AF with last dose on Sunday. I would plan on surgery next Tuesday which is our first opening. Plan CABG, pulmonary vein isolation ablation and LAA clip. I discussed the operative procedure with the patient  including alternatives, benefits and risks; including but not limited to bleeding, blood transfusion, infection, stroke, myocardial infarction, graft failure, heart block requiring a permanent pacemaker, organ dysfunction, and death.  Oneil DELENA Monte understands and agrees to proceed.    Dorise LOIS Fellers, MD

## 2023-04-12 NOTE — Progress Notes (Signed)
 Heart Failure Navigator Progress Note  Assessed for Heart & Vascular TOC clinic readiness.  Patient does not meet criteria due to EF 65-70%, plans foe CABG evaluation. .   Navigator will sign off at this time.   Stephane Haddock, BSN, Scientist, Clinical (histocompatibility And Immunogenetics) Only

## 2023-04-12 NOTE — Interval H&P Note (Signed)
 History and Physical Interval Note:  04/12/2023 10:37 AM  David [name]  has presented today for surgery, with the diagnosis of nstemi.  The various methods of treatment have been discussed with the patient and family. After consideration of risks, benefits and other options for treatment, the patient has consented to  Procedure(s): LEFT HEART CATH AND CORONARY ANGIOGRAPHY (N/A) as a surgical intervention.  The patient's history has been reviewed, patient examined, no change in status, stable for surgery.  I have reviewed the patient's chart and labs.  Questions were answered to the patient's satisfaction.    Cath Lab Visit (complete for each Cath Lab visit)  Clinical Evaluation Leading to the Procedure:   ACS: Yes.    Non-ACS:    Anginal Classification: CCS III  Anti-ischemic medical therapy: Minimal Therapy (1 class of medications)  Non-Invasive Test Results: No non-invasive testing performed  Prior CABG: No previous CABG        Lonni Cash

## 2023-04-12 NOTE — Progress Notes (Addendum)
 PHARMACY - ANTICOAGULATION CONSULT NOTE  Pharmacy Consult for continuous infusion of unfractionated heparin  intravenously.  Indication: chest pain/ACS  No Known Allergies  Patient Measurements: Height: 6' (182.9 cm) Weight: 80.7 kg (177 lb 14.6 oz) IBW/kg (Calculated) : 77.6 Heparin  Dosing Weight: 76 kg.  Vital Signs: Temp: 97.9 F (36.6 C) (01/08 1925) Temp Source: Oral (01/08 1925) BP: 156/93 (01/08 1925) Pulse Rate: 76 (01/08 1925)  Labs: Recent Labs    04/10/23 0225 04/11/23 0837 04/11/23 1608 04/12/23 0335  HGB 12.3* 12.7*  --  12.3*  HCT 36.4* 37.4*  --  35.2*  PLT 182 189  --  199  APTT  --  101* 77* 76*  HEPARINUNFRC  --  >1.10*  --  0.68  CREATININE 2.28* 1.89*  --  1.62*    Estimated Creatinine Clearance: 52.6 mL/min (A) (by C-G formula based on SCr of 1.62 mg/dL (H)).   Medical History: Past Medical History:  Diagnosis Date   Cardiomyopathy (HCC)    a. EF 45% in 2019.   CHF (congestive heart failure) (HCC)    Chronic anticoagulation 09/30/2017   Diabetes mellitus type 2 in nonobese [facility])    Discitis of thoracic region 09/25/2022   Does not have health insurance    Essential hypertension 09/26/2017   Financial difficulties    H/O noncompliance with medical treatment, presenting hazards to health    Hardware complicating wound infection (HCC) 09/25/2022   Non-insulin  treated type 2 diabetes mellitus (HCC) 09/26/2017   Persistent atrial fibrillation (HCC)    Sleep apnea suspected 09/30/2017    Medications:  Scheduled:   acetaminophen   1,000 mg Oral Q6H   Chlorhexidine  Gluconate Cloth  6 each Topical Q0600   diclofenac  Sodium  4 g Topical QID   digoxin   125 mcg Oral Daily   doxycycline   100 mg Oral Q12H   famotidine   20 mg Oral Daily   fluticasone   2 spray Each Nare Daily   insulin  aspart  0-15 Units Subcutaneous TID WC   insulin  glargine-yfgn  32 Units Subcutaneous QHS   lidocaine   1 patch Transdermal Q24H   melatonin  5 mg Oral QHS    mupirocin  ointment  1 Application Nasal BID   oxyCODONE   15 mg Oral Q12H   pregabalin   75 mg Oral QHS   rosuvastatin   20 mg Oral Daily   sertraline   25 mg Oral Daily   sodium chloride  flush  3 mL Intravenous Q12H   sodium chloride  flush  3-10 mL Intravenous Q12H    Assessment: 63 yo male s/p cath with multivessel CAD and plans are for CABG eval. Pharmacy to start heparin  2 hours after TR band (removed ~ 5pm per RN). He was on apixaban  prior to cath with last dose 1/6 in the morning  -last heparin  rate was 1200 units/hr and aPTT was at goal   Goal of Therapy:  Heparin  level 0.3-0.7 units/ml aPTT 66-102 Monitor platelets by anticoagulation protocol: Yes,    Plan:  -Restart heparin  at 1200 units/hr -Heparin  level aPTT and CBC in am  Prentice Poisson, PharmD Clinical Pharmacist **Pharmacist phone directory can now be found on [email or web address] ([email or web address]).  Listed under [facility].

## 2023-04-12 NOTE — Progress Notes (Signed)
 OT Cancellation Note  Patient Details Name: David [name] MRN: [id number] DOB: 05/31/1960   Cancelled Treatment:    Reason Eval/Treat Not Completed: Other (comment). Pt sleeping on arrival reporting he did not sleep well last night. Will return as schedule allows.   David [name], OTR/L [facility] Acute Rehabilitation Office: [phone]   David [name] 04/12/2023, 9:09 AM

## 2023-04-12 NOTE — Progress Notes (Signed)
 PHARMACY - ANTICOAGULATION CONSULT NOTE  Pharmacy Consult for continuous infusion of unfractionated heparin  intravenously.  Indication: chest pain/ACS  No Known Allergies  Patient Measurements: Height: 6' (182.9 cm) Weight: 80.7 kg (177 lb 14.6 oz) IBW/kg (Calculated) : 77.6 Heparin  Dosing Weight: 76 kg.  Vital Signs: Temp: 97.6 F (36.4 C) (01/08 0410) Temp Source: Oral (01/08 0410) BP: 128/99 (01/08 0410) Pulse Rate: 61 (01/08 0410)  Labs: Recent Labs    04/09/23 1101 04/09/23 1314 04/09/23 1624 04/10/23 0225 04/11/23 0837 04/11/23 1608 04/12/23 0335  HGB 13.3  --   --  12.3* 12.7*  --  12.3*  HCT 38.3*  --   --  36.4* 37.4*  --  35.2*  PLT 194  --   --  182 189  --  199  APTT  --   --   --   --  101* 77* 76*  HEPARINUNFRC  --   --   --   --  >1.10*  --  0.68  CREATININE 1.42*  --   --  2.28* 1.89*  --  1.62*  TROPONINIHS 210* 259* 253*  --   --   --   --     Estimated Creatinine Clearance: 52.6 mL/min (A) (by C-G formula based on SCr of 1.62 mg/dL (H)).   Medical History: Past Medical History:  Diagnosis Date   Cardiomyopathy (HCC)    a. EF 45% in 2019.   CHF (congestive heart failure) (HCC)    Chronic anticoagulation 09/30/2017   Diabetes mellitus type 2 in nonobese [facility])    Discitis of thoracic region 09/25/2022   Does not have health insurance    Essential hypertension 09/26/2017   Financial difficulties    H/O noncompliance with medical treatment, presenting hazards to health    Hardware complicating wound infection (HCC) 09/25/2022   Non-insulin  treated type 2 diabetes mellitus (HCC) 09/26/2017   Persistent atrial fibrillation (HCC)    Sleep apnea suspected 09/30/2017    Medications:  Scheduled:   acetaminophen   1,000 mg Oral Q6H   Chlorhexidine  Gluconate Cloth  6 each Topical Q0600   diclofenac  Sodium  4 g Topical QID   digoxin   125 mcg Oral Daily   doxycycline   100 mg Oral Q12H   famotidine   20 mg Oral Daily   fluticasone   2 spray Each  Nare Daily   insulin  aspart  0-15 Units Subcutaneous TID WC   insulin  glargine-yfgn  32 Units Subcutaneous QHS   lidocaine   1 patch Transdermal Q24H   melatonin  5 mg Oral QHS   mupirocin  ointment  1 Application Nasal BID   oxyCODONE   15 mg Oral Q12H   pregabalin   75 mg Oral QHS   rosuvastatin   20 mg Oral Daily   sodium chloride  flush  3-10 mL Intravenous Q12H    Assessment: Heparin  infusing with no problems noted by the bedside nurse at 1200 units/hr. Heparin  level was collected at 0335 and reported as 0.68 units/mL, aPTT 76 seconds, both indicating therapeutic on CIUFH. Appears that the previously seen spurious increase in heparin  level determination(s) has abated,the DOAC having had adequate time for washout. May continue to use heparin  level now as the surrogate marker of efficacy and safety.   Goal of Therapy:  Heparin  level 0.3-0.7 units/ml Monitor platelets by anticoagulation protocol: Yes, PLTC 199 today.   Plan:  Check heparin  level daily. Check PLTC daily. F/u plans for [facility] after cath.   Lynwood KATHEE Lites, PharmD, CPP 04/12/2023,7:39 AM

## 2023-04-12 NOTE — Progress Notes (Signed)
   Patient Name: David [name] Date of Encounter: 04/12/2023 [facility] Cardiologist: Maude Emmer, MD   Interval Summary  .    No complaints, breathing is fine. Headed down to the cath lab  Vital Signs .    Vitals:   04/11/23 2250 04/11/23 2331 04/12/23 0410 04/12/23 0735  BP:  106/66 (!) 128/99 99/67  Pulse: 68 71 61 61  Resp: 20 18 18 20   Temp:  98.2 F (36.8 C) 97.6 F (36.4 C) (!) 97.5 F (36.4 C)  TempSrc:  Oral Oral Oral  SpO2: 97% 97% 98% 97%  Weight:   80.7 kg   Height:        Intake/Output Summary (Last 24 hours) at 04/12/2023 1018 Last data filed at 04/12/2023 0745 Gross per 24 hour  Intake 806.08 ml  Output 1275 ml  Net -468.92 ml      04/12/2023    4:10 AM 04/11/2023    5:52 AM 04/09/2023    3:38 PM  Last 3 Weights  Weight (lbs) 177 lb 14.6 oz 175 lb 14.8 oz 168 lb 14 oz  Weight (kg) 80.7 kg 79.8 kg 76.6 kg      Telemetry/ECG    Atrial fibrillation rates 45-50s bpm - Personally Reviewed  Physical Exam .    GEN: No acute distress.   Neck: No JVD Cardiac: Irreg Irreg, no murmurs, rubs, or gallops.  Respiratory: Clear to auscultation bilaterally. GI: Soft, nontender, non-distended  MS: No edema  Assessment & Plan .     63 y.o. male with a hx of HFimpEF, DM, HTN, persistent atrial fibrillation, hx of MRSA bacteremia c/b osteomyelitis who was seen 04/10/2023 for the evaluation of abnormal EKG/elevated troponin/CHF at the request of Dr. Rosan.    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 Presented with acute onset shortness of breath morning of 1/5. BNP 419 and CXR with pulmonary edema -- s/p IV lasix , UOP 2.6L since admission and breathing improved -- symptoms are concerning for anginal equivalent  -- Echo 1/7 LVEF of 65-70%, mildly enlarged RV, moderately dilated LA, mild MR -- GDMT: BPs are soft, therefore no room for additional therapy at this time    NSTEMI Abnormal EKG -- EKG on admission is concerning for  ACS with TWI in anterolateral leads and ST elevation an aVR -- hsTn flat 200s -- no chest pain PTA -- planned for cardiac cath today   Persistent atrial fibrillation -- hx of failed prior DCCV -- remains rate controlled, on dig 0.125mg  daily  -- hold Eliquis , on IV heparin  with plans for cardiac cath    HTN -- actually soft most recently -- hold meds for now   DM -- Hgb A1c 7.4 -- on lantus  and farxiga     CKD with AKI -- Cr 1.4 on admission up to 2.2, now improved to 1.6. s/p pre-cath fluid  -- follow BMET -- could consider renal artery dopplers with labile BPs   Per Primary Chronic back pain Hx of MRSA bacteremia     For questions or updates, please contact [facility] Please consult [email or web address] for contact info under        Signed, Manuelita Rummer, NP

## 2023-04-12 NOTE — Progress Notes (Addendum)
 Summary: David [name] is a 63 y.o. 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   Hospital Day: 3  Subjective:  Patient seen and evaluated while resting in bed after heart catheterization. Patient reports he spoke with cardiology about his cath results. He expressed concerns with not being able to get back to work especially with recovering from recent back surgery. 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 He expressed feelings of anxiety around his current health status and financial challenges with being out of work. The team provided support and answered his questions.   Patient denied CP or SOB on exam today.   Objective:  Vital signs in last 24 hours: Vitals:   04/12/23 1350 04/12/23 1400 04/12/23 1415 04/12/23 1445  BP: (!) 145/98 (!) 151/98 (!) 169/89 112/71  Pulse: 73 89 76 75  Resp:      Temp:      TempSrc:      SpO2: 100% 98% 98% 97%  Weight:      Height:       Physical Exam General: NAD, anxious appearing, cooperative Cardiovascular: irregularly irregular rhythm, no murmurs, rubs or gallops; JVD present Respiratory: regular work of breathing, clear to auscultation bilaterally Extremities: no peripheral edema appreciated  Weight change: 0.9 kg  Intake/Output Summary (Last 24 hours) at 04/12/2023 1550 Last data filed at 04/12/2023 1300 Gross per 24 hour  Intake 460 ml  Output 1225 ml  Net -765 ml      Latest Ref Rng & Units 04/12/2023    3:35 AM 04/11/2023    8:37 AM 04/10/2023    2:25 AM  CBC  WBC 4.0 - 10.5 K/uL 5.6  5.3  5.9   Hemoglobin 13.0 - 17.0 g/dL 87.6  87.2  87.6   Hematocrit 39.0 - 52.0 % 35.2  37.4  36.4   Platelets 150 - 400 K/uL 199  189  182       Latest Ref Rng & Units 04/12/2023    3:35 AM 04/11/2023    8:37 AM 04/10/2023    2:25 AM  CMP  Glucose  70 - 99 mg/dL 817  741  728   BUN 8 - 23 mg/dL 36  41  31   Creatinine 0.61 - 1.24 mg/dL 8.37  8.10  7.71   Sodium 135 - 145 mmol/L 135  132  133   Potassium 3.5 - 5.1 mmol/L 4.5  4.5  4.4   Chloride 98 - 111 mmol/L 106  101  101   CO2 22 - 32 mmol/L 21  23  22    Calcium  8.9 - 10.3 mg/dL 8.9  9.2  8.8    Cardiac Catheterization Findings:   Ost RCA to Dist RCA lesion is 20% stenosed.   RPDA lesion is 99% stenosed.   Lat 1st Mrg lesion is 70% stenosed.   1st Mrg lesion is 95% stenosed.   Ost Cx to Prox Cx lesion is 90% stenosed.   Ost LAD to Prox LAD lesion is 50% stenosed.   Prox LAD to Mid LAD lesion is 99% stenosed.   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 Patient's renal function improving with slight decrease in creatine today. JVD present on exam. Continue strict I/Os and home digoxin . Continuing to hold lasix . Cardiology following, appreciate recommendations.   Paroxysmal Atrial fibrillation  Previously on Eliquis . Switched to IV heparin  per cardiology for heart catheterization today.   NSTEMI CAD Cardiac catheterization completed today with unfortunately significant stenosis. Cardiology recommends bypass surgery. CT surgery consulted. Patient scheduled for surgery on 04/18/23. Appreciate cardiology and CT surgery recommendations.  Anxiety  Patient expressed anxiety around his recent cardiac findings and financial challenges with missing  additional work. Patient interested in starting medication to assist with depressive and anxious thoughts. Sertraline  25 mg ordered. Consultation to Chaplain ordered as we feel he may appreciate talking with someone about his current challenges.   T2DM with hyperglycemia CBGs stable overnight (147-227). Will hold off increasing his Semglee  to 32 units from 28 units until this evening given patient was NPO with cardiac catheterization scheduled for today.   Chronic Back Pain Stable. Continue oxycodone  15 mg q12h while hospitalized and home robaxin  750 mg BID PRN and lyrica  75 mg daily.   Hx of MRSA bacteremia  Stable. Continue home doxycycline  100 mg BID for chronic infection suppression.   GERD  Stable. Continue famotidine  to 20 mg daily.    Diet: Carb modified VTE: IV Heparin  for cardiac catheterization  IVF: Normal Saline Code: Full   Dispo: Anticipate discharge after CABG.   LOS: 2 days   Saunders Damien MATSU, Medical Student 04/12/2023, 3:50 PM

## 2023-04-12 NOTE — Progress Notes (Signed)
 OT Cancellation Note  Patient Details Name: David [name] MRN: [id number] DOB: [birth year]   Cancelled Treatment:    Reason Eval/Treat Not Completed: Patient at procedure or test/ unavailable (at cath lab will follow up as time allows.)  [name] FREDERICK, OTR/L [facility] Acute Rehabilitation Office: [phone]  [name] 04/12/2023, 10:48 AM

## 2023-04-13 ENCOUNTER — Encounter (HOSPITAL_COMMUNITY): Payer: Self-pay | Admitting: Cardiovascular Disease

## 2023-04-13 DIAGNOSIS — I25700 Atherosclerosis of coronary artery bypass graft(s), unspecified, with unstable angina pectoris: Secondary | ICD-10-CM | POA: Diagnosis not present

## 2023-04-13 DIAGNOSIS — E1122 Type 2 diabetes mellitus with diabetic chronic kidney disease: Secondary | ICD-10-CM

## 2023-04-13 DIAGNOSIS — M545 Low back pain, unspecified: Secondary | ICD-10-CM | POA: Diagnosis not present

## 2023-04-13 DIAGNOSIS — I214 Non-ST elevation (NSTEMI) myocardial infarction: Secondary | ICD-10-CM | POA: Diagnosis not present

## 2023-04-13 DIAGNOSIS — I10 Essential (primary) hypertension: Secondary | ICD-10-CM | POA: Diagnosis not present

## 2023-04-13 DIAGNOSIS — E1151 Type 2 diabetes mellitus with diabetic peripheral angiopathy without gangrene: Secondary | ICD-10-CM | POA: Diagnosis not present

## 2023-04-13 DIAGNOSIS — Z794 Long term (current) use of insulin: Secondary | ICD-10-CM | POA: Diagnosis not present

## 2023-04-13 DIAGNOSIS — I4819 Other persistent atrial fibrillation: Secondary | ICD-10-CM | POA: Diagnosis not present

## 2023-04-13 DIAGNOSIS — N1832 Chronic kidney disease, stage 3b: Secondary | ICD-10-CM

## 2023-04-13 DIAGNOSIS — G8929 Other chronic pain: Secondary | ICD-10-CM

## 2023-04-13 DIAGNOSIS — I509 Heart failure, unspecified: Secondary | ICD-10-CM | POA: Diagnosis not present

## 2023-04-13 DIAGNOSIS — I5021 Acute systolic (congestive) heart failure: Secondary | ICD-10-CM | POA: Diagnosis not present

## 2023-04-13 DIAGNOSIS — I5031 Acute diastolic (congestive) heart failure: Secondary | ICD-10-CM | POA: Diagnosis not present

## 2023-04-13 LAB — HEPARIN LEVEL (UNFRACTIONATED)
Heparin Unfractionated: 0.25 [IU]/mL — ABNORMAL LOW (ref 0.30–0.70)
Heparin Unfractionated: 0.33 [IU]/mL (ref 0.30–0.70)

## 2023-04-13 LAB — BASIC METABOLIC PANEL
Anion gap: 9 (ref 5–15)
BUN: 36 mg/dL — ABNORMAL HIGH (ref 8–23)
CO2: 21 mmol/L — ABNORMAL LOW (ref 22–32)
Calcium: 9.3 mg/dL (ref 8.9–10.3)
Chloride: 106 mmol/L (ref 98–111)
Creatinine, Ser: 1.65 mg/dL — ABNORMAL HIGH (ref 0.61–1.24)
GFR, Estimated: 47 mL/min — ABNORMAL LOW (ref >60)
Glucose, Bld: 174 mg/dL — ABNORMAL HIGH (ref 70–99)
Potassium: 4.8 mmol/L (ref 3.5–5.1)
Sodium: 136 mmol/L (ref 135–145)

## 2023-04-13 LAB — CBC
HCT: 37 % — ABNORMAL LOW (ref 39.0–52.0)
Hemoglobin: 12.8 g/dL — ABNORMAL LOW (ref 13.0–17.0)
MCH: 32.7 pg (ref 26.0–34.0)
MCHC: 34.6 g/dL (ref 30.0–36.0)
MCV: 94.6 fL (ref 80.0–100.0)
Platelets: 194 10*3/uL (ref 150–400)
RBC: 3.91 MIL/uL — ABNORMAL LOW (ref 4.22–5.81)
RDW: 12.9 % (ref 11.5–15.5)
WBC: 8.6 10*3/uL (ref 4.0–10.5)
nRBC: 0 % (ref 0.0–0.2)

## 2023-04-13 LAB — TROPONIN I (HIGH SENSITIVITY)
Troponin I (High Sensitivity): 51 ng/L — ABNORMAL HIGH (ref <18)
Troponin I (High Sensitivity): 51 ng/L — ABNORMAL HIGH (ref <18)

## 2023-04-13 LAB — APTT: aPTT: 42 s — ABNORMAL HIGH (ref 24–36)

## 2023-04-13 LAB — GLUCOSE, CAPILLARY
Glucose-Capillary: 129 mg/dL — ABNORMAL HIGH (ref 70–99)
Glucose-Capillary: 131 mg/dL — ABNORMAL HIGH (ref 70–99)
Glucose-Capillary: 171 mg/dL — ABNORMAL HIGH (ref 70–99)
Glucose-Capillary: 176 mg/dL — ABNORMAL HIGH (ref 70–99)
Glucose-Capillary: 189 mg/dL — ABNORMAL HIGH (ref 70–99)

## 2023-04-13 MED ORDER — LORAZEPAM 2 MG/ML IJ SOLN
0.5000 mg | Freq: Once | INTRAMUSCULAR | Status: AC
  Administered 2023-04-13: 0.5 mg via INTRAVENOUS
  Filled 2023-04-13: qty 1

## 2023-04-13 MED ORDER — LORAZEPAM 2 MG/ML IJ SOLN: 0.5000 mg | Freq: Once | INTRAMUSCULAR | Status: DC

## 2023-04-13 MED ORDER — METOPROLOL SUCCINATE ER 50 MG PO TB24
50.0000 mg | ORAL_TABLET | Freq: Every day | ORAL | Status: DC
  Administered 2023-04-13: 50 mg via ORAL
  Filled 2023-04-13: qty 1

## 2023-04-13 MED ORDER — HEPARIN (PORCINE) 25000 UT/250ML-% IV SOLN
1400.0000 [IU]/h | INTRAVENOUS | Status: DC
  Administered 2023-04-13 – 2023-04-17 (×6): 1400 [IU]/h via INTRAVENOUS
  Filled 2023-04-13 (×5): qty 250

## 2023-04-13 MED ORDER — HEPARIN (PORCINE) 25000 UT/250ML-% IV SOLN
18.0000 [IU]/kg/h | INTRAVENOUS | Status: DC
  Administered 2023-04-13 (×2): 18 [IU]/kg/h via INTRAVENOUS
  Filled 2023-04-13: qty 250

## 2023-04-13 MED ORDER — LORAZEPAM 2 MG/ML IJ SOLN: 1.0000 mg | Freq: Once | INTRAMUSCULAR | Status: DC

## 2023-04-13 MED ORDER — TRIMETHOBENZAMIDE HCL 100 MG/ML IM SOLN
200.0000 mg | Freq: Once | INTRAMUSCULAR | Status: AC
  Administered 2023-04-13: 200 mg via INTRAMUSCULAR
  Filled 2023-04-13: qty 2

## 2023-04-13 MED ORDER — ONDANSETRON HCL 4 MG/2ML IJ SOLN
4.0000 mg | Freq: Once | INTRAMUSCULAR | Status: AC
  Administered 2023-04-13: 4 mg via INTRAVENOUS
  Filled 2023-04-13: qty 2

## 2023-04-13 MED ORDER — LORAZEPAM 0.5 MG PO TABS
0.5000 mg | ORAL_TABLET | Freq: Every day | ORAL | Status: DC | PRN
  Administered 2023-04-13 – 2023-04-17 (×5): 0.5 mg via ORAL
  Filled 2023-04-13 (×6): qty 1

## 2023-04-13 MED ORDER — ONDANSETRON 4 MG PO TBDP
4.0000 mg | ORAL_TABLET | Freq: Three times a day (TID) | ORAL | Status: AC | PRN
  Administered 2023-04-13: 4 mg via ORAL
  Filled 2023-04-13: qty 1

## 2023-04-13 NOTE — Progress Notes (Signed)
 PHARMACY - ANTICOAGULATION CONSULT NOTE  Pharmacy Consult for continuous infusion of unfractionated heparin  intravenously.  Indication: chest pain/ACS  No Known Allergies  Patient Measurements: Height: 6' (182.9 cm) Weight: 77.5 kg (170 lb 13.7 oz) IBW/kg (Calculated) : 77.6 Heparin  Dosing Weight: 76 kg.  Vital Signs: Temp: 98.2 F (36.8 C) (01/09 1625) Temp Source: Oral (01/09 1625) BP: 134/92 (01/09 1625) Pulse Rate: 73 (01/09 1625)  Labs: Recent Labs    04/11/23 0837 04/11/23 1608 04/12/23 0335 04/13/23 0304 04/13/23 0429 04/13/23 1232  HGB 12.7*  --  12.3* 12.8*  --   --   HCT 37.4*  --  35.2* 37.0*  --   --   PLT 189  --  199 194  --   --   APTT 101* 77* 76* 42*  --   --   HEPARINUNFRC >1.10*  --  0.68 0.25*  --  0.33  CREATININE 1.89*  --  1.62* 1.65*  --   --   TROPONINIHS  --   --   --  51* 51*  --     Estimated Creatinine Clearance: 51.5 mL/min (A) (by C-G formula based on SCr of 1.65 mg/dL (H)).   Medical History: Past Medical History:  Diagnosis Date   Cardiomyopathy (HCC)    a. EF 45% in 2019.   CHF (congestive heart failure) (HCC)    Chronic anticoagulation 09/30/2017   Diabetes mellitus type 2 in nonobese [facility])    Discitis of thoracic region 09/25/2022   Does not have health insurance    Essential hypertension 09/26/2017   Financial difficulties    H/O noncompliance with medical treatment, presenting hazards to health    Hardware complicating wound infection (HCC) 09/25/2022   Non-insulin  treated type 2 diabetes mellitus (HCC) 09/26/2017   Persistent atrial fibrillation (HCC)    Sleep apnea suspected 09/30/2017    Medications:  Scheduled:   acetaminophen   1,000 mg Oral Q6H   Chlorhexidine  Gluconate Cloth  6 each Topical Q0600   diclofenac  Sodium  4 g Topical QID   digoxin   125 mcg Oral Daily   doxycycline   100 mg Oral Q12H   famotidine   20 mg Oral Daily   fluticasone   2 spray Each Nare Daily   insulin  aspart  0-15 Units Subcutaneous  TID WC   insulin  glargine-yfgn  32 Units Subcutaneous QHS   lidocaine   1 patch Transdermal Q24H   melatonin  5 mg Oral QHS   metoprolol  succinate  50 mg Oral Daily   mupirocin  ointment  1 Application Nasal BID   oxyCODONE   15 mg Oral Q12H   pregabalin   75 mg Oral QHS   rosuvastatin   20 mg Oral Daily   sodium chloride  flush  3 mL Intravenous Q12H   sodium chloride  flush  3-10 mL Intravenous Q12H    Assessment: 63 yo male s/p cath with multivessel CAD and plans are for CABG eval. He was on apixaban  prior to cath with last dose 1/6 in the morning  -heparin  level 0.33 and at goal this morning and rate increased to 1400 units/hr  Goal of Therapy:  Heparin  level 0.3-0.7 units/ml aPTT 66-102 Monitor platelets by anticoagulation protocol: Yes,    Plan:  -Continue heparin  at 1400 units/hr -Heparin  level aPTT and CBC in am  Prentice Poisson, PharmD Clinical Pharmacist **Pharmacist phone directory can now be found on [email or web address] ([email or web address]).  Listed under [facility].

## 2023-04-13 NOTE — Progress Notes (Addendum)
 Summary: David [name] is a 63 y.o. 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    Hospital Day: 4  Subjective:  Patient reports he is feeling about the same as yesterday. He reports he had anxiety overnight given his recent health status. See Dr. Tena overnight note. Patient mentioned he was able to share with his wife about his catheterization results. He states he still has some anxiety and feels like he is not able to get adequate rest while being in the hospital. He said he feels like the sertraline  caused him to nauseated and would like to stop this medicine.   Objective:  Vital signs in last 24 hours: Vitals:   04/12/23 1925 04/13/23 0057 04/13/23 0505 04/13/23 0756  BP: (!) 156/93 (!) 177/102 (!) 142/89 120/75  Pulse: 76 80 74 73  Resp: 18 20 20 20   Temp: 97.9 F (36.6 C) 98.2 F (36.8 C) 97.9 F (36.6 C) 98.2 F (36.8 C)  TempSrc: Oral Oral Oral Oral  SpO2: 97% 98% 90% 97%  Weight:   77.5 kg   Height:       Physical Exam: General: NAD, asleep prior to exam, tired and slightly anxious appearing Cardiology: Irregularly irregular rhythm, normal rate, no murmurs, rubs or gallops Pulmonary: normal work of breathing, lungs clear to auscultation bilaterally  Weight change: -3.2 kg  Intake/Output Summary (Last 24 hours) at 04/13/2023 0813 Last data filed at 04/13/2023 0319 Gross per 24 hour  Intake 240 ml  Output 1025 ml  Net -785 ml      Latest Ref Rng & Units 04/13/2023    3:04 AM 04/12/2023    3:35 AM 04/11/2023    8:37 AM  CBC  WBC 4.0 - 10.5 K/uL 8.6  5.6  5.3   Hemoglobin 13.0 - 17.0 g/dL 87.1  87.6  87.2   Hematocrit 39.0 - 52.0 % 37.0  35.2  37.4   Platelets 150 - 400 K/uL 194  199  189       Latest Ref Rng & Units 04/13/2023    3:04 AM  04/12/2023    3:35 AM 04/11/2023    8:37 AM  CMP  Glucose 70 - 99 mg/dL 825  817  741   BUN 8 - 23 mg/dL 36  36  41   Creatinine 0.61 - 1.24 mg/dL 8.34  8.37  8.10   Sodium 135 - 145 mmol/L 136  135  132   Potassium 3.5 - 5.1 mmol/L 4.8  4.5  4.5   Chloride 98 - 111 mmol/L 106  106  101   CO2 22 - 32 mmol/L 21  21  23    Calcium  8.9 - 10.3 mg/dL 9.3  8.9  9.2    Assessment/Plan:  Principal Problem:   Acute systolic heart failure (HCC) Active Problems:   Type 2 diabetes mellitus (HCC)   Atrial fibrillation (HCC)   Acute hypoxic respiratory failure (HCC)   AKI (acute kidney injury), ruled out   Chronic pain syndrome   Elevated troponin   MRSA infection greater than 3 months ago   Acute decompensated heart failure (HCC)   Coronary artery disease involving coronary bypass graft of native heart with unstable angina pectoris (HCC)   Acute on chronic diastolic (congestive) heart failure (HCC)  David [name] is a 63 y.o. 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 Patient's renal function stable from yesterday. Continue strict I/Os and home digoxin . Continuing to hold lasix . Cardiology following, appreciate recommendations.    Paroxysmal Atrial fibrillation  Previously on Eliquis . Switched to IV heparin . Pharmacy following, appreciate the assistance.    NSTEMI CAD- 3 vessel Cardiac catheterization revealed significant stenosis. Cardiology recommends bypass surgery. Patient scheduled for surgery on 04/18/23. CT surgery and cardiology following, appreciate recommendations. Patient to remain hospitalized until CABG.   Anxiety   Patient expressed anxiety around his recent cardiac findings and financial challenges with missing additional work. Patient started on sertraline  yesterday; however, he asked to stop given his nausea.   T2DM with hyperglycemia CBGs stable overnight (143-176). Continue SSI and Semglee  32 units.   Chronic Back Pain Stable. Continue oxycodone  15 mg q12h while hospitalized and home robaxin  750 mg BID PRN and lyrica  75 mg daily.    Hx of MRSA bacteremia  Stable. Continue home doxycycline  100 mg BID for chronic infection suppression.    GERD  Stable. Continue famotidine  to 20 mg daily.    Diet: Carb modified VTE: IV Heparin   IVF: None Code: Full   Dispo: Anticipate discharge after CABG.   LOS: 3 days   David [name], Medical Student 04/13/2023, 8:13 AM

## 2023-04-13 NOTE — Plan of Care (Signed)

## 2023-04-13 NOTE — Progress Notes (Signed)
   Patient Name: David [name] Date of Encounter: 04/13/2023 [facility] Cardiologist: Maude Emmer, MD   Interval Summary  .    Had rough evening, very anxious about CABG  Vital Signs .    Vitals:   04/13/23 0057 04/13/23 0505 04/13/23 0756 04/13/23 0914  BP: (!) 177/102 (!) 142/89 120/75   Pulse: 80 74 73 76  Resp: 20 20 20    Temp: 98.2 F (36.8 C) 97.9 F (36.6 C) 98.2 F (36.8 C)   TempSrc: Oral Oral Oral   SpO2: 98% 90% 97%   Weight:  77.5 kg    Height:        Intake/Output Summary (Last 24 hours) at 04/13/2023 1106 Last data filed at 04/13/2023 0319 Gross per 24 hour  Intake 240 ml  Output 1025 ml  Net -785 ml      04/13/2023    5:05 AM 04/12/2023    4:10 AM 04/11/2023    5:52 AM  Last 3 Weights  Weight (lbs) 170 lb 13.7 oz 177 lb 14.6 oz 175 lb 14.8 oz  Weight (kg) 77.5 kg 80.7 kg 79.8 kg      Telemetry/ECG    Rate controlled atrial fibrillation - Personally Reviewed  Physical Exam .   GEN: No acute distress.   Neck: No JVD Cardiac: Irreg Irreg, no murmurs, rubs, or gallops.  Respiratory: Clear to auscultation bilaterally. GI: Soft, nontender, non-distended  MS: No edema  Assessment & Plan .     63 y.o. male with a hx of HFimpEF, DM, HTN, persistent atrial fibrillation, hx of MRSA bacteremia c/b osteomyelitis who was seen 04/10/2023 for the evaluation of abnormal EKG/elevated troponin/CHF at the request of Dr. Rosan.    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 Presented with acute onset shortness of breath morning of 1/5. BNP 419 and CXR with pulmonary edema -- s/p IV lasix , UOP 3.4L since admission and breathing improved -- Echo 1/7 LVEF of 65-70%, mildly enlarged RV, moderately dilated LA, mild MR   NSTEMI Abnormal EKG -- EKG on admission is concerning for ACS with TWI in anterolateral leads and ST elevation an aVR -- hsTn flat 200s -- underwent cardiac cath with multivessel CAD, seen by CT surgery with plans  for CABG pulmonary vein isolation ablation and LAA clip on 1/14 -- on statin   Persistent atrial fibrillation -- hx of failed prior DCCV -- remains rate controlled, on dig 0.125mg  daily  -- hold Eliquis , on IV heparin     HTN -- had been soft but up today -- consider adding back low dose BB therapy if remains elevated    DM -- Hgb A1c 7.4 -- on lantus  and farxiga     CKD with AKI -- Cr 1.4 on admission up to 2.2, now improved to 1.6. -- follow BMET   Per Primary Chronic back pain Hx of MRSA bacteremia   For questions or updates, please contact [facility] Please consult [email or web address] for contact info under        Signed, Manuelita Rummer, NP

## 2023-04-13 NOTE — Progress Notes (Signed)
 Physical Therapy Treatment Patient Details Name: David [name] MRN: [id number] DOB: [birth year] Today's Date: 04/13/2023   History of Present Illness Patient is 63 y.o. male who presented with dyspnea and is admitted for acute heart failure exacerbation. Of note pt had extended hospital stay from 08/20/22-10/28/22 during which he underwent surgical repair 08/30/22: T5-11 posterior/posterolateral arthrodesis  & Transpedicular decompression with partial corpectomy, left T8 & T8-9 laminectomy, bilateral facetectomy. 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 Since discharge in May 2024 pt has represented to ED for back pain x3 and for fall x1.    PT Comments  Patient sitting EOB ordering meal at start of session, agreeable to mobilize with therapy. Supervision for initial stand from EOB and pt using IV pole for hallway ambulation. CGA for safety today as pt demonstrated slightly increased shuffled steps and drift as distance progressed and he fatigued. Seated rest provided and pt's HR in low 100's. Pt agreeable to progress functional exercises and step ups completed with bil UE support on bed rail and forward steps on 7 box. Pt HR elevated to 130 bpm max and recovered following exercise to 100 with seated rest. EOS pt remained EOB and comfortable. RN present. Will continue to progress pt as able during stay.     If plan is discharge home, recommend the following: Assist for transportation;Help with stairs or ramp for entrance   Can travel by private vehicle        Equipment Recommendations  None recommended by PT    Recommendations for Other Services       Precautions / Restrictions Precautions Precautions: Fall Precaution Comments: 1 fall Restrictions Weight Bearing Restrictions Per Provider  Order: No     Mobility  Bed Mobility Overal bed mobility: Modified Independent             General bed mobility comments: sitting up EOB    Transfers Overall transfer level: Needs assistance Equipment used: None Transfers: Sit to/from Stand Sit to Stand: Contact guard assist, Supervision           General transfer comment: supervision for initial stand from EOB, pt mildly unsteady on stands from EOB following ambulation, suspect secondary to fatigue.    Ambulation/Gait Ambulation/Gait assistance: Contact guard assist Gait Distance (Feet): 400 Feet Assistive device: IV Pole Gait Pattern/deviations: Step-through pattern, Wide base of support, Drifts right/left, Decreased stride length Gait velocity: decr     General Gait Details: amb hallawy with IV pole, pt continues to have wide base for stability, mildly increased sway with gait and CGA for safety.   Stairs             Wheelchair Mobility     Tilt Bed    Modified Rankin (Stroke Patients Only)       Balance Overall balance assessment: Needs assistance Sitting-balance support: Feet supported Sitting balance-Leahy Scale: Good     Standing balance support: During functional activity, No upper extremity supported, Single extremity supported Standing balance-Leahy Scale: Good                              Cognition Arousal: Alert Behavior During Therapy: WFL for tasks assessed/performed Overall Cognitive Status: Within Functional Limits for tasks assessed  Exercises Other Exercises Other Exercises: forward step ups: 1x 5 reps bil LE with bil UE support on bed rail    General Comments        Pertinent Vitals/Pain Pain Assessment Pain Assessment: No/denies pain Pain Location: denies pain stating controlled with meds at the moment however has chronic ongoing pain. Pain Intervention(s): Limited activity within patient's  tolerance, Monitored during session, Repositioned    Home Living                          Prior Function            PT Goals (current goals can now be found in the care plan section) Acute Rehab PT Goals Patient Stated Goal: get home and back to rehab PT Goal Formulation: With patient Time For Goal Achievement: 04/24/23 Potential to Achieve Goals: Good Progress towards PT goals: Progressing toward goals    Frequency    Min 1X/week      PT Plan      Co-evaluation              AM-PAC PT 6 Clicks Mobility   Outcome Measure  Help needed turning from your back to your side while in a flat bed without using bedrails?: None Help needed moving from lying on your back to sitting on the side of a flat bed without using bedrails?: None Help needed moving to and from a bed to a chair (including a wheelchair)?: A Little Help needed standing up from a chair using your arms (e.g., wheelchair or bedside chair)?: A Little Help needed to walk in hospital room?: A Little Help needed climbing 3-5 steps with a railing? : A Little 6 Click Score: 20    End of Session Equipment Utilized During Treatment: Gait belt Activity Tolerance: Patient tolerated treatment well Patient left: in chair;with call bell/phone within reach Nurse Communication: Mobility status PT Visit Diagnosis: Unsteadiness on feet (R26.81);Difficulty in walking, not elsewhere classified (R26.2);Other symptoms and signs involving the nervous system (R29.898);Pain     Time: 8594-8563 PT Time Calculation (min) (ACUTE ONLY): 31 min  Charges:    $Gait Training: 8-22 mins $Therapeutic Exercise: 8-22 mins PT General Charges $$ ACUTE PT VISIT: 1 Visit                     Vernell DONEEN KLEIN, DPT Acute Rehabilitation Services Office [phone]  04/13/23 5:18 PM

## 2023-04-13 NOTE — Progress Notes (Signed)
 PHARMACY - ANTICOAGULATION Pharmacy Consult for heparin   Indication: CAD/Afib Brief A/P: Heparin  level subtherapeutic Increase Heparin  rate  No Known Allergies  Patient Measurements: Height: 6' (182.9 cm) Weight: 80.7 kg (177 lb 14.6 oz) IBW/kg (Calculated) : 77.6 Heparin  Dosing Weight: 76 kg.  Vital Signs: Temp: 98.2 F (36.8 C) (01/09 0057) Temp Source: Oral (01/09 0057) BP: 177/102 (01/09 0057) Pulse Rate: 80 (01/09 0057)  Labs: Recent Labs    04/11/23 0837 04/11/23 1608 04/12/23 0335 04/13/23 0304  HGB 12.7*  --  12.3* 12.8*  HCT 37.4*  --  35.2* 37.0*  PLT 189  --  199 194  APTT 101* 77* 76* 42*  HEPARINUNFRC >1.10*  --  0.68 0.25*  CREATININE 1.89*  --  1.62*  --     Estimated Creatinine Clearance: 52.6 mL/min (A) (by C-G formula based on SCr of 1.62 mg/dL (H)).   Assessment: 63 y.o. male with h/o Afib and Eliquis  on hold, s/p cath and awaiting possible CABG, for heparin   Goal of Therapy:  Heparin  level 0.3-0.7 units/ml Monitor platelets by anticoagulation protocol: Yes,    Plan:  Increase Heparin  1350 units/hr Check heparin  level in 8 hours.  Cathlyn Arrant, PharmD, BCPS

## 2023-04-13 NOTE — Progress Notes (Signed)
 Received a page about this patient regarding nausea, IM Tigan  given.  Following Tigan  administration, received another page regarding this patient.  Presented at bedside, or patient had concerns of shortness of breath, chest pain, insomnia, and anxiety.  EKG obtained which showed no ST changes concerning for MI in this patient with recent NSTEMI.  Troponins obtained and pending.  0.5 mg Ativan  IV given for anxiety and nausea.

## 2023-04-13 NOTE — Progress Notes (Addendum)
 PHARMACY - ANTICOAGULATION CONSULT NOTE  Pharmacy Consult for continuous infusion of unfractionated heparin  intravenously.  Indication: chest pain/ACS  No Known Allergies  Patient Measurements: Height: 6' (182.9 cm) Weight: 77.5 kg (170 lb 13.7 oz) IBW/kg (Calculated) : 77.6 Heparin  Dosing Weight: 77.5kg  Vital Signs: Temp: 98.3 F (36.8 C) (01/09 1209) Temp Source: Oral (01/09 1209) BP: 126/81 (01/09 1209) Pulse Rate: 68 (01/09 1209)  Labs: Recent Labs    04/11/23 0837 04/11/23 1608 04/12/23 0335 04/13/23 0304 04/13/23 0429 04/13/23 1232  HGB 12.7*  --  12.3* 12.8*  --   --   HCT 37.4*  --  35.2* 37.0*  --   --   PLT 189  --  199 194  --   --   APTT 101* 77* 76* 42*  --   --   HEPARINUNFRC >1.10*  --  0.68 0.25*  --  0.33  CREATININE 1.89*  --  1.62* 1.65*  --   --   TROPONINIHS  --   --   --  51* 51*  --     Estimated Creatinine Clearance: 51.5 mL/min (A) (by C-G formula based on SCr of 1.65 mg/dL (H)).   Medical History: Past Medical History:  Diagnosis Date   Cardiomyopathy (HCC)    a. EF 45% in 2019.   CHF (congestive heart failure) (HCC)    Chronic anticoagulation 09/30/2017   Diabetes mellitus type 2 in nonobese [facility])    Discitis of thoracic region 09/25/2022   Does not have health insurance    Essential hypertension 09/26/2017   Financial difficulties    H/O noncompliance with medical treatment, presenting hazards to health    Hardware complicating wound infection (HCC) 09/25/2022   Non-insulin  treated type 2 diabetes mellitus (HCC) 09/26/2017   Persistent atrial fibrillation (HCC)    Sleep apnea suspected 09/30/2017    Medications:  Scheduled:   acetaminophen   1,000 mg Oral Q6H   Chlorhexidine  Gluconate Cloth  6 each Topical Q0600   diclofenac  Sodium  4 g Topical QID   digoxin   125 mcg Oral Daily   doxycycline   100 mg Oral Q12H   famotidine   20 mg Oral Daily   fluticasone   2 spray Each Nare Daily   insulin  aspart  0-15 Units Subcutaneous  TID WC   insulin  glargine-yfgn  32 Units Subcutaneous QHS   lidocaine   1 patch Transdermal Q24H   melatonin  5 mg Oral QHS   mupirocin  ointment  1 Application Nasal BID   oxyCODONE   15 mg Oral Q12H   pregabalin   75 mg Oral QHS   rosuvastatin   20 mg Oral Daily   sodium chloride  flush  3 mL Intravenous Q12H   sodium chloride  flush  3-10 mL Intravenous Q12H    Assessment: CIUFH infusing at 1350 units/hr. Heparin  level collected at 1232 hours 04/13/23 is reported as 0.33 units/mL, therapeutic within the goal range of 0.3 - 0.7 units/mL. Goal of Therapy:  Heparin  level 0.3-0.7 units/ml Monitor platelets by anticoagulation protocol: Yes. PLCT stable at 194 K/uL   Plan:  Increase infusion to 1400 units/hr. ~ 18 units/kg/hr. Will evaluate 6-hour heparin  level after infusion increase from 1350 u/hr to 1400 units/hr.  Lynwood KATHEE Lites, PharmD, CPP 04/13/2023,1:25 PM

## 2023-04-14 ENCOUNTER — Ambulatory Visit: Payer: Medicaid Other | Admitting: Physical Therapy

## 2023-04-14 ENCOUNTER — Inpatient Hospital Stay (HOSPITAL_COMMUNITY): Payer: Medicaid Other

## 2023-04-14 DIAGNOSIS — I4821 Permanent atrial fibrillation: Secondary | ICD-10-CM

## 2023-04-14 DIAGNOSIS — E7800 Pure hypercholesterolemia, unspecified: Secondary | ICD-10-CM | POA: Diagnosis not present

## 2023-04-14 DIAGNOSIS — R42 Dizziness and giddiness: Secondary | ICD-10-CM | POA: Diagnosis not present

## 2023-04-14 DIAGNOSIS — I214 Non-ST elevation (NSTEMI) myocardial infarction: Principal | ICD-10-CM

## 2023-04-14 DIAGNOSIS — R001 Bradycardia, unspecified: Secondary | ICD-10-CM | POA: Diagnosis not present

## 2023-04-14 DIAGNOSIS — E1151 Type 2 diabetes mellitus with diabetic peripheral angiopathy without gangrene: Secondary | ICD-10-CM | POA: Diagnosis not present

## 2023-04-14 DIAGNOSIS — I509 Heart failure, unspecified: Secondary | ICD-10-CM | POA: Diagnosis not present

## 2023-04-14 DIAGNOSIS — I5021 Acute systolic (congestive) heart failure: Secondary | ICD-10-CM | POA: Diagnosis not present

## 2023-04-14 LAB — BASIC METABOLIC PANEL
Anion gap: 8 (ref 5–15)
BUN: 28 mg/dL — ABNORMAL HIGH (ref 8–23)
CO2: 21 mmol/L — ABNORMAL LOW (ref 22–32)
Calcium: 9.2 mg/dL (ref 8.9–10.3)
Chloride: 106 mmol/L (ref 98–111)
Creatinine, Ser: 1.59 mg/dL — ABNORMAL HIGH (ref 0.61–1.24)
GFR, Estimated: 49 mL/min — ABNORMAL LOW (ref >60)
Glucose, Bld: 161 mg/dL — ABNORMAL HIGH (ref 70–99)
Potassium: 4.6 mmol/L (ref 3.5–5.1)
Sodium: 135 mmol/L (ref 135–145)

## 2023-04-14 LAB — GLUCOSE, CAPILLARY
Glucose-Capillary: 130 mg/dL — ABNORMAL HIGH (ref 70–99)
Glucose-Capillary: 101 mg/dL — ABNORMAL HIGH (ref 70–99)
Glucose-Capillary: 121 mg/dL — ABNORMAL HIGH (ref 70–99)
Glucose-Capillary: 205 mg/dL — ABNORMAL HIGH (ref 70–99)
Glucose-Capillary: 151 mg/dL — ABNORMAL HIGH (ref 70–99)

## 2023-04-14 LAB — CBC
HCT: 34.1 % — ABNORMAL LOW (ref 39.0–52.0)
Hemoglobin: 11.6 g/dL — ABNORMAL LOW (ref 13.0–17.0)
MCH: 32.7 pg (ref 26.0–34.0)
MCHC: 34 g/dL (ref 30.0–36.0)
MCV: 96.1 fL (ref 80.0–100.0)
Platelets: 168 10*3/uL (ref 150–400)
RBC: 3.55 MIL/uL — ABNORMAL LOW (ref 4.22–5.81)
RDW: 13.2 % (ref 11.5–15.5)
WBC: 7.3 10*3/uL (ref 4.0–10.5)
nRBC: 0 % (ref 0.0–0.2)

## 2023-04-14 LAB — HEPARIN LEVEL (UNFRACTIONATED): Heparin Unfractionated: 0.5 [IU]/mL (ref 0.30–0.70)

## 2023-04-14 MED ORDER — EZETIMIBE 10 MG PO TABS
10.0000 mg | ORAL_TABLET | Freq: Every day | ORAL | Status: DC
  Administered 2023-04-14 – 2023-05-02 (×18): 10 mg via ORAL
  Filled 2023-04-14 (×18): qty 1

## 2023-04-14 NOTE — Progress Notes (Signed)
 PHARMACY - ANTICOAGULATION CONSULT NOTE  Pharmacy Consult for continuous infusion of unfractionated heparin  intravenously.  Indication: chest pain/ACS  No Known Allergies  Patient Measurements: Height: 6' (182.9 cm) Weight: 76.8 kg (169 lb 5 oz) IBW/kg (Calculated) : 77.6 Heparin  Dosing Weight: 76 kg  Vital Signs: Temp: 97.5 F (36.4 C) (01/10 0435) Temp Source: Oral (01/10 0435) BP: 105/75 (01/10 0435) Pulse Rate: 51 (01/10 0435)  Labs: Recent Labs    04/11/23 1608 04/12/23 0335 04/13/23 0304 04/13/23 0429 04/13/23 1232 04/14/23 0249  HGB  --  12.3* 12.8*  --   --  11.6*  HCT  --  35.2* 37.0*  --   --  34.1*  PLT  --  199 194  --   --  168  APTT 77* 76* 42*  --   --   --   HEPARINUNFRC  --  0.68 0.25*  --  0.33 0.50  CREATININE  --  1.62* 1.65*  --   --  1.59*  TROPONINIHS  --   --  51* 51*  --   --     Estimated Creatinine Clearance: 53 mL/min (A) (by C-G formula based on SCr of 1.59 mg/dL (H)).   Medical History: Past Medical History:  Diagnosis Date   Cardiomyopathy (HCC)    a. EF 45% in 2019.   CHF (congestive heart failure) (HCC)    Chronic anticoagulation 09/30/2017   Diabetes mellitus type 2 in nonobese [facility])    Discitis of thoracic region 09/25/2022   Does not have health insurance    Essential hypertension 09/26/2017   Financial difficulties    H/O noncompliance with medical treatment, presenting hazards to health    Hardware complicating wound infection (HCC) 09/25/2022   Non-insulin  treated type 2 diabetes mellitus (HCC) 09/26/2017   Persistent atrial fibrillation (HCC)    Sleep apnea suspected 09/30/2017    Medications:  Scheduled:   acetaminophen   1,000 mg Oral Q6H   diclofenac  Sodium  4 g Topical QID   digoxin   125 mcg Oral Daily   doxycycline   100 mg Oral Q12H   famotidine   20 mg Oral Daily   fluticasone   2 spray Each Nare Daily   insulin  aspart  0-15 Units Subcutaneous TID WC   insulin  glargine-yfgn  32 Units Subcutaneous QHS    lidocaine   1 patch Transdermal Q24H   melatonin  5 mg Oral QHS   metoprolol  succinate  50 mg Oral Daily   oxyCODONE   15 mg Oral Q12H   pregabalin   75 mg Oral QHS   rosuvastatin   20 mg Oral Daily   sodium chloride  flush  3 mL Intravenous Q12H    Assessment: CIUFH infusing at 1400 units/mL. Heparin  level 0.5 u/mL at 0248 hours 04/14/23.No bleeding documented by nursing notes.  Goal of Therapy:  Heparin  level 0.3-0.7 units/ml Monitor platelets by anticoagulation protocol: Yes, 168 K/uL down from 194 K/uL yesterday.    Plan:  Continue at 1400 units/hr.  Lynwood KATHEE Lites, PharmD, CPP 04/14/2023,8:03 AM

## 2023-04-14 NOTE — Plan of Care (Signed)

## 2023-04-14 NOTE — Progress Notes (Addendum)
 Summary: David [name] is a 63 y.o. 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   Overnight: Patient was anxious around midnight. Received 0.5 mg IV ativan .   Subjective: Paged by nurse around 0700 about patient with new symptomatic bradycardia. Went to evaluate him bedside. He reports the nurse woke him up because his HR was low. Upon awakening, he felt lightheaded and fatigued, with shortness of breath. He was put on O2 with improvement of dyspnea. He seems a little somnolent on exam. BP wnl. HR between high 30s and low 50s, in atrial fibrillation. ECG confirmed bradycardia, with some t-wave inversions in the precordial leads. We ordered troponins, CXR, and POC CGB. Metoprolol  and digoxin  were held. Called cardiology, who plan to see him today.   Objective:  Vital signs in last 24 hours: Vitals:   04/13/23 2017 04/13/23 2357 04/14/23 0435 04/14/23 1111  BP: 120/89 121/76 105/75 103/74  Pulse: 69 65 (!) 51 60  Resp: 18 18 18 20   Temp: 97.9 F (36.6 C) (!) 97.5 F (36.4 C) (!) 97.5 F (36.4 C) (!) 97.5 F (36.4 C)  TempSrc: Oral Oral Oral Oral  SpO2: 98% 92% 96% 91%  Weight:   76.8 kg   Height:       Physical Exam: General: NAD, asleep prior to exam, tired and slightly anxious appearing Cardiology: Irregularly irregular rhythm, normal rate, no murmurs, rubs or gallops; distal pulses palpated, extremities warm Pulmonary: normal work of breathing, lungs clear to auscultation bilaterally Skin: access site dry, in tact, no tenderness or bulging  Weight change: -0.7 kg  Intake/Output Summary (Last 24 hours) at 04/14/2023 1347 Last data filed at 04/14/2023 1237 Gross per 24 hour  Intake 902.89 ml  Output 1550 ml  Net -647.11 ml      Latest Ref Rng & Units 04/14/2023     2:49 AM 04/13/2023    3:04 AM 04/12/2023    3:35 AM  CBC  WBC 4.0 - 10.5 K/uL 7.3  8.6  5.6   Hemoglobin 13.0 - 17.0 g/dL 88.3  87.1  87.6   Hematocrit 39.0 - 52.0 % 34.1  37.0  35.2   Platelets 150 - 400 K/uL 168  194  199       Latest Ref Rng & Units 04/14/2023    2:49 AM 04/13/2023    3:04 AM 04/12/2023    3:35 AM  CMP  Glucose 70 - 99 mg/dL 838  825  817   BUN 8 - 23 mg/dL 28  36  36   Creatinine 0.61 - 1.24 mg/dL 8.40  8.34  8.37   Sodium 135 - 145 mmol/L 135  136  135   Potassium 3.5 - 5.1 mmol/L 4.6  4.8  4.5   Chloride 98 - 111 mmol/L 106  106  106   CO2 22 - 32 mmol/L 21  21  21    Calcium  8.9 - 10.3 mg/dL 9.2  9.3  8.9    Assessment/Plan:  Principal Problem:   Acute systolic heart failure (HCC) Active Problems:   Primary hypertension   Type 2 diabetes mellitus (HCC)   Atrial fibrillation (HCC)   Acute hypoxic respiratory failure (HCC)   AKI (acute kidney injury), ruled out   Chronic pain syndrome   Elevated troponin  MRSA infection greater than 3 months ago   Acute decompensated heart failure (HCC)   Coronary artery disease involving coronary bypass graft of native heart with unstable angina pectoris (HCC)   Acute on chronic diastolic (congestive) heart failure (HCC)   NSTEMI (non-ST elevated myocardial infarction) (HCC)   Chronic bilateral low back pain  David [name] is a 63 y.o. 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  Symptomatic bradycardia As described above. Suspect it was secondary to the combination of metoprolol  and digoxin . On re-evaluation, his symptoms are resolved and his HR is up into the high 50s. We will continue to monitor and will likely restart the metoprolol  tomorrow. Called cardiology, appreciate assistance.    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 On room air. Patient's renal function remains stable. Continue strict I/Os. Continuing to hold lasix . Cardiology following, appreciate recommendations.    Paroxysmal Atrial fibrillation  Previously on Eliquis , now on IV heparin . Pharmacy following, appreciate the assistance.    NSTEMI CAD- 3 vessel Cardiac catheterization revealed significant multivessel stenosis. Patient is scheduled for surgery on 04/18/23. We will optimize him until then. CT surgery and cardiology following, appreciate recommendations.   Anxiety  Patient continues to experience anxiety around his recent cardiac findings and financial challenges with missing additional work. Has ativan  PRN daily for anxiety.    T2DM with hyperglycemia CBGs stable. Continue SSI and Semglee  32 units, which are controlling his blood sugars well.    Chronic Back Pain Stable. Continue oxycodone  15 mg q12h while hospitalized and home robaxin  750 mg BID PRN and lyrica  75 mg daily.    Hx of MRSA bacteremia  Stable. Continue home doxycycline  100 mg BID for chronic infection suppression.    GERD  Stable. Continue famotidine  to 20 mg daily.    Diet: Carb modified VTE: IV Heparin   IVF: None Code: Full   Dispo: Anticipate discharge after CABG.  Gregary Sharper, MD 04/14/2023, 1:47 PM

## 2023-04-14 NOTE — Progress Notes (Signed)
 Occupational Therapy Treatment Patient Details Name: David [name] MRN: [id number] DOB: 1961-02-25 Today's Date: 04/14/2023   History of present illness Patient is 63 y.o. male who presented with dyspnea and is admitted for acute heart failure exacerbation. Of note pt had extended hospital stay from 08/20/22-10/28/22 during which he underwent surgical repair 08/30/22: T5-11 posterior/posterolateral arthrodesis  & Transpedicular decompression with partial corpectomy, left T8 & T8-9 laminectomy, bilateral facetectomy. 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 Since discharge in May 2024 pt has represented to ED for back pain x3 and for fall x1.   OT comments  Pt progressing towards OT goals this session. Pt overall supervision for OOB activity with HR WFL (low 100's) and focused session on compensatory strategies for ADL with sternal precautions. Move in the tube applied to rear peri care, transfers, dressing/bathing, etc. Pt receptive to education. Reports being a little anxious about surgery. Will have support of family post-op. OT will continue to follow acutely.       If plan is discharge home, recommend the following:  Assistance with Environmental Health Practitioner    Recommendations for Other Services      Precautions / Restrictions Precautions Precautions: Fall Precaution Comments: 1 fall Restrictions Weight Bearing Restrictions Per Provider Order: No       Mobility Bed Mobility Overal bed mobility: Modified Independent Bed Mobility: Supine to Sit, Sit to Supine     Supine to sit: Modified independent (Device/Increase time) Sit to supine: Modified independent (Device/Increase time)        Transfers Overall transfer level: Needs  assistance Equipment used: None Transfers: Sit to/from Stand Sit to Stand: Contact guard assist, Supervision     Step pivot transfers: Supervision     General transfer comment: no physical assist needed     Balance Overall balance assessment: Needs assistance Sitting-balance support: Feet supported Sitting balance-Leahy Scale: Good     Standing balance support: During functional activity, No upper extremity supported, Single extremity supported Standing balance-Leahy Scale: Good                             ADL either performed or assessed with clinical judgement   ADL Overall ADL's : Needs assistance/impaired     Grooming: Wash/dry hands;Wash/dry face;Oral care;Supervision/safety;Standing Grooming Details (indicate cue type and reason): sink level         Upper Body Dressing : Modified independent Upper Body Dressing Details (indicate cue type and reason): donning extra gown. initiated education with sternal precautions Lower Body Dressing: Supervision/safety;Sitting/lateral leans Lower Body Dressing Details (indicate cue type and reason): socks Toilet Transfer: Supervision/safety;Ambulation;Grab bars           Functional mobility during ADLs: Supervision/safety General ADL Comments: started education for sternal precautions as CABG scheduled 1/14 move in the tube and compensatory strategies for ADL    Extremity/Trunk Assessment              Vision       Perception     Praxis      Cognition Arousal: Alert Behavior During Therapy: [facility] for tasks assessed/performed Overall Cognitive Status: Within Functional Limits for tasks assessed  Exercises      Shoulder Instructions       General Comments      Pertinent Vitals/ Pain       Pain Assessment Pain Assessment: No/denies pain Pain Location: denies pain stating controlled with meds at the moment however has chronic ongoing  pain. Pain Intervention(s): Monitored during session  Home Living                                          Prior Functioning/Environment              Frequency  Min 1X/week        Progress Toward Goals  OT Goals(current goals can now be found in the care plan section)  Progress towards OT goals: Progressing toward goals  Acute Rehab OT Goals Patient Stated Goal: have a successful sx OT Goal Formulation: With patient Time For Goal Achievement: 04/24/23 Potential to Achieve Goals: Good ADL Goals Pt Will Perform Grooming: with supervision;with set-up;with modified independence;standing Pt Will Perform Lower Body Bathing: with modified independence;sit to/from stand Pt Will Perform Lower Body Dressing: with modified independence;sit to/from stand Pt Will Perform Tub/Shower Transfer: with modified independence;ambulating;3 in 1;tub bench;grab bars Additional ADL Goal #1: Pt will safely gather ADL and toiletry items Sup - Mod I from drawers and closet in prep for morning ADLs without LOB  Plan      Co-evaluation                 AM-PAC OT 6 Clicks Daily Activity     Outcome Measure   Help from another person eating meals?: None Help from another person taking care of personal grooming?: A Little Help from another person toileting, which includes using toliet, bedpan, or urinal?: None Help from another person bathing (including washing, rinsing, drying)?: A Little Help from another person to put on and taking off regular upper body clothing?: None Help from another person to put on and taking off regular lower body clothing?: A Little 6 Click Score: 21    End of Session    OT Visit Diagnosis: Pain;Other abnormalities of gait and mobility (R26.89) Pain - part of body:  (back)   Activity Tolerance Patient tolerated treatment well   Patient Left in bed   Nurse Communication          Time: 9064-9041 OT Time Calculation (min): 23  min  Charges: OT General Charges $OT Visit: 1 Visit OT Treatments $Self Care/Home Management : 8-22 mins  David [name] Acute Rehabilitation Services Office: [phone]  David [name] [facility] 04/14/2023, 1:29 PM

## 2023-04-14 NOTE — Progress Notes (Addendum)
 Pre-op education done. We discussed the importance of sternal precautions/staying in the tube, IS use, importance of mobility at least 3x's a day, and what to expect the day of surgery. OHS booklet was given. Pt's wife and cousin will be with him the first week he is home. Pt had no questions at this time.   Augustin Sharps, RRT [phone]

## 2023-04-14 NOTE — Progress Notes (Signed)
   04/14/23 1456  Spiritual Encounters  Type of Visit Initial  Care provided to: Patient  Conversation partners present during encounter Nurse  Reason for visit Routine spiritual support   Chaplain's Reason for Visit: Chaplain responded to Spiritual Consult stating the Pt was experiencing major life transition and looking for some spiritual support.  Chaplain time spent: 45 minutes  Chaplain Interventions: Explored Pt's emotional and spiritual needs and resources Facilitated processing of complex emotions Facilitated storytelling and situation review Practiced empathetic listening and reflective feedback Chaplain engaged Pt in theological exploration Chaplain provided the ritual of prayer when requested  Chaplain Assessment: Pt Needs Spiritual: to know that his prayer is being heard and that God still has a plan for his life Emotional: Pt states "My self-esteem has never been lower" as his wife is forced to carry the weight of supporting them financially as his health keeps him from working Relational: none identified Intermediate hopes: 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 Ultimate hopes: "To be with Jesus in heaven"  Pt Resources  Resources Identified:  Personal resources: Pt possesses Development Worker, International Aid, Quarry Manager, and Wisdom Interpersonal Resources: Pt speaks well of his wife, and they have a relative graciously helping them through this time Suprapersonal Resources: Pt possesses an active Christian faith which he uses to process his emotions and situations in his life.  Also provides hope. (Needs Support, Supported, Well Supported & Strong ) Spiritual: Supported  Emotional: Needs continued Supported Relational: Supported  Additional Assessment:  Pt called chaplain to help him process his newest health crisis and the emotions that have come with it.  Pt endured a difficult and lengthy back  surgery and recovery last year and was just getting to a place of recovery and hoping to return to working.  Now he needs a triple by-pass and is discouraged. Pt wonders "is God punishing me or testing my faith?" He is determined not to give up, but really struggles to understand sometimes. Pt has a friend who serves as a orthoptist (somewhere?) who also visits and helps him process theological questions. Chaplain was able to use Theological exploration with the Pt to help process and defuse the Pt's hurt and frustration.  Chaplaincy Plan: Chaplain will continue to follow this patient through the upcoming triple by-pass, until discharge.  Chaplain Maude Roll, MDiv [email or web address] [email or web address]

## 2023-04-14 NOTE — Progress Notes (Signed)
 Patient Name: David [name] Date of Encounter: 04/14/2023 [facility] Cardiologist: Maude Emmer, MD  04/09/2023 .admit Length of stay: 4  Interval Summary  .    David [name] is a 63 y.o. HFimpEF, DM with stage IIIa-B CKD, HTN, permanent atrial fibrillation, hx of MRSA bacteremia c/b osteomyelitis who was seen 04/10/2023 for the evaluation of abnormal EKG/elevated troponin/CHF suggestive of NSTEMI, underwent cardiac catheterization on Monday 2024 revealing significant three-vessel coronary disease and is now awaiting CABG.   Physical Exam    Vitals:   04/13/23 1625 04/13/23 2017 04/13/23 2357 04/14/23 0435  BP: (!) 134/92 120/89 121/76 105/75  Pulse: 73 69 65 (!) 51  Resp: 18 18 18 18   Temp: 98.2 F (36.8 C) 97.9 F (36.6 C) (!) 97.5 F (36.4 C) (!) 97.5 F (36.4 C)  TempSrc: Oral Oral Oral Oral  SpO2: 94% 98% 92% 96%  Weight:    76.8 kg  Height:       Physical Exam Neck:     Vascular: No carotid bruit or JVD.  Cardiovascular:     Rate and Rhythm: Bradycardia present. Rhythm irregularly irregular.     Pulses: Intact distal pulses.          Dorsalis pedis pulses are 0 on the right side and 0 on the left side.       Posterior tibial pulses are 0 on the right side and 0 on the left side.     Heart sounds: Normal heart sounds. No murmur heard.    No gallop.  Pulmonary:     Effort: Pulmonary effort is normal.     Breath sounds: Normal breath sounds.  Abdominal:     General: Bowel sounds are normal.     Palpations: Abdomen is soft.  Musculoskeletal:     Right lower leg: No edema.     Left lower leg: No edema.  Skin:    Capillary Refill: Capillary refill takes less than 2 seconds.        04/14/2023    4:35 AM 04/13/2023    5:05 AM 04/12/2023    4:10 AM  Last 3 Weights  Weight (lbs) 169 lb 5 oz 170 lb 13.7 oz 177 lb 14.6 oz  Weight (kg) 76.8 kg 77.5 kg 80.7 kg      Labs   Lab Results  Component Value Date   NA 135 04/14/2023   K 4.6 04/14/2023   CO2  21 (L) 04/14/2023   GLUCOSE 161 (H) 04/14/2023   BUN 28 (H) 04/14/2023   CREATININE 1.59 (H) 04/14/2023   CALCIUM  9.2 04/14/2023   EGFR 64 01/09/2023   GFRNONAA 49 (L) 04/14/2023       Latest Ref Rng & Units 04/14/2023    2:49 AM 04/13/2023    3:04 AM 04/12/2023    3:35 AM  BMP  Glucose 70 - 99 mg/dL 838  825  817   BUN 8 - 23 mg/dL 28  36  36   Creatinine 0.61 - 1.24 mg/dL 8.40  8.34  8.37   Sodium 135 - 145 mmol/L 135  136  135   Potassium 3.5 - 5.1 mmol/L 4.6  4.8  4.5   Chloride 98 - 111 mmol/L 106  106  106   CO2 22 - 32 mmol/L 21  21  21    Calcium  8.9 - 10.3 mg/dL 9.2  9.3  8.9        Latest Ref Rng & Units 04/14/2023  2:49 AM 04/13/2023    3:04 AM 04/12/2023    3:35 AM  CBC  WBC 4.0 - 10.5 K/uL 7.3  8.6  5.6   Hemoglobin 13.0 - 17.0 g/dL 88.3  87.1  87.6   Hematocrit 39.0 - 52.0 % 34.1  37.0  35.2   Platelets 150 - 400 K/uL 168  194  199     Lab Results  Component Value Date   CHOL 199 01/09/2023   HDL 36 (L) 01/09/2023   LDLCALC 121 (H) 01/09/2023   TRIG 235 (H) 01/09/2023   CHOLHDL 5.9 (H) 12/14/2021    Lab Results  Component Value Date   TSH 0.855 09/04/2022    Lab Results  Component Value Date   HGBA1C 7.4 (H) 04/09/2023    Cardiac Panel (last 3 results) Recent Labs    04/13/23 0304 04/13/23 0429  TROPONINIHS 51* 51*    BNP (last 3 results) Recent Labs    08/14/22 0341 08/15/22 0400 04/09/23 1101  BNP 335.1* 193.9* 419.7*    ProBNP (last 3 results) No results for input(s): PROBNP in the last 8760 hours.   Intake/Output Summary (Last 24 hours) at 04/14/2023 0908 Last data filed at 04/14/2023 0458 Gross per 24 hour  Intake 1102.89 ml  Output 1200 ml  Net -97.11 ml    Net IO Since Admission: -3,582.03 mL [04/14/23 0908]   Tele/EKG/Cardiac studies    Telemetry 04/14/2023: Atrial fibrillation with controlled ventricular response, no significant pauses, no ventricular arrhythmias.- Personally Reviewed  EKG:  EKG 04/14/2023:  Underlying atrial fibrillation with controlled ventricular response at the rate of 48 bpm, anterolateral T wave inversion, ischemia versus subendocardial infarct.  Compared to 04/13/2023, T wave inversion in the anterolateral leads more prominent however compared to 04/10/2023, ST segment depression has improved.  Echocardiogram 04/11/2023:  1. Left ventricular ejection fraction, by estimation, is 65 to 70%. The left ventricle has normal function. The left ventricle has no regional wall motion abnormalities. There is mild concentric left ventricular hypertrophy. Left ventricular diastolic function could not be evaluated.  2. Right ventricular systolic function is mildly reduced. The right ventricular size is mildly enlarged. Tricuspid regurgitation signal is inadequate for assessing PA pressure.  3. Left atrial size was moderately dilated.  4. The mitral valve is grossly normal. Mild mitral valve regurgitation. No evidence of mitral stenosis.  5. The aortic valve is tricuspid. Aortic valve regurgitation is not visualized. No aortic stenosis is present.  6. The inferior vena cava is dilated in size with <50% respiratory variability, suggesting right atrial pressure of 15 mmHg.  Coronary Angiogram 04/12/2023:   Ost RCA to Dist RCA lesion is 20% stenosed.   RPDA lesion is 99% stenosed.   Lat 1st Mrg lesion is 70% stenosed.   1st Mrg lesion is 95% stenosed.   Ost Cx to Prox Cx lesion is 90% stenosed.   Ost LAD to Prox LAD lesion is 50% stenosed.   Prox LAD to Mid LAD lesion is 99% stenosed.   Dist LAD lesion is 50% stenosed.     Radiology   Chest x-ray 04/14/2023: Mild central pulmonary vascular congestion is noted with possible minimal pulmonary edema.  Current Meds:     Current Facility-Administered Medications:    acetaminophen  (TYLENOL ) tablet 1,000 mg, 1,000 mg, Oral, Q6H, Verlin Lonni BIRCH, MD, 1,000 mg at 04/14/23 9361   diclofenac  Sodium (VOLTAREN ) 1 % topical gel 4 g, 4 g,  Topical, QID, Verlin Lonni BIRCH, MD, 4 g at 04/13/23 2105  doxycycline  (VIBRA -TABS) tablet 100 mg, 100 mg, Oral, Q12H, Verlin Lonni BIRCH, MD, 100 mg at 04/13/23 2104   famotidine  (PEPCID ) tablet 20 mg, 20 mg, Oral, Daily, Verlin Lonni BIRCH, MD, 20 mg at 04/13/23 9085   fluticasone  (FLONASE ) 50 MCG/ACT nasal spray 2 spray, 2 spray, Each Nare, Daily, Verlin Lonni BIRCH, MD, 2 spray at 04/13/23 0916   heparin  ADULT infusion 100 units/mL (25000 units/250mL), 1,400 Units/hr, Intravenous, Continuous, Gretel Prentice BIRCH, [facility], Last Rate: 14 mL/hr at 04/14/23 0458, 1,400 Units/hr at 04/14/23 0458   hydrOXYzine  (ATARAX ) tablet 25 mg, 25 mg, Oral, TID PRN, Verlin Lonni BIRCH, MD, 25 mg at 04/13/23 2104   insulin  aspart (novoLOG ) injection 0-15 Units, 0-15 Units, Subcutaneous, TID WC, McAlhany, Christopher D, MD, 2 Units at 04/14/23 9361   insulin  glargine-yfgn (SEMGLEE ) injection 32 Units, 32 Units, Subcutaneous, QHS, Verlin Lonni BIRCH, MD, 32 Units at 04/13/23 2104   lidocaine  (LIDODERM ) 5 % 1 patch, 1 patch, Transdermal, Q24H, Hoffman, Erik C, DO   LORazepam  (ATIVAN ) tablet 0.5 mg, 0.5 mg, Oral, Daily PRN, Justus, Michael, MD, 0.5 mg at 04/13/23 1232   melatonin tablet 5 mg, 5 mg, Oral, QHS, Verlin Lonni BIRCH, MD, 5 mg at 04/13/23 2105   methocarbamol  (ROBAXIN ) tablet 750 mg, 750 mg, Oral, BID PRN, Verlin Lonni BIRCH, MD, 750 mg at 04/14/23 9361   ondansetron  (ZOFRAN -ODT) disintegrating tablet 4 mg, 4 mg, Oral, Q8H PRN, McLendon, Michael, MD, 4 mg at 04/13/23 1232   oxyCODONE  (OXYCONTIN ) 12 hr tablet 15 mg, 15 mg, Oral, Q12H, Verlin Lonni BIRCH, MD, 15 mg at 04/13/23 2104   pregabalin  (LYRICA ) capsule 75 mg, 75 mg, Oral, QHS, Verlin Lonni BIRCH, MD, 75 mg at 04/13/23 2104   rosuvastatin  (CRESTOR ) tablet 20 mg, 20 mg, Oral, Daily, Verlin Lonni D, MD, 20 mg at 04/13/23 0914   sodium chloride  flush (NS) 0.9 % injection 3 mL, 3 mL, Intravenous, Q12H,  Verlin Lonni BIRCH, MD, 3 mL at 04/13/23 2105   sodium chloride  flush (NS) 0.9 % injection 3 mL, 3 mL, Intravenous, PRN, Verlin Lonni BIRCH, MD  Assessment & Plan .     This morning patient had an episode of marked dizziness when he sat up to have his breakfast, was also found to be bradycardic and hence metoprolol  was discontinued.  Presently asymptomatic, states that he finished his breakfast and feels well and no chest pain or dyspnea.  1.  NSTEMI 2.  CAD of the native vessel with unstable angina 3.  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  Primary hypertension 5.  Hypercholesterolemia  Recommendations: I had discontinued digoxin  before and started him on metoprolol , heart rate is clearly decreased with metoprolol  use.  I will prefer him to be on a beta-blocker and digoxin  as his LVEF is normal.  Will hold off on beta-blockers today and consider starting him back on the medication once his heart rate improves to >50 to 60 bpm.  He is hypertensive, not on an ACE inhibitor or ARB in view of acute on chronic kidney disease which has now stabilized however will hold off on starting ACE inhibitor's or ARB in view of anticipated surgery/CABG on Tuesday.  I will start him on amlodipine  5 mg daily.  LDL is not at target, in view of chronic back pain, will be difficult to evaluate myopathy from back pain hence we will add Zetia  10 mg daily to Crestor  20 mg daily.  Continue narcotics for chronic  back pain.  I will have the patient walk in the hallway today.  For questions or updates, please contact  [facility] Please consult [email or web address] for contact info under      Signed, Gordy Bergamo, MD, [facility] 04/14/2023, 9:08 AM [facility] [address] [place], [place] [address] Phone: [phone]. Fax:  [phone]  Cell: [phone]

## 2023-04-15 ENCOUNTER — Inpatient Hospital Stay (HOSPITAL_COMMUNITY): Payer: Medicaid Other

## 2023-04-15 DIAGNOSIS — I214 Non-ST elevation (NSTEMI) myocardial infarction: Secondary | ICD-10-CM | POA: Diagnosis not present

## 2023-04-15 DIAGNOSIS — Z01810 Encounter for preprocedural cardiovascular examination: Secondary | ICD-10-CM

## 2023-04-15 LAB — GLUCOSE, CAPILLARY
Glucose-Capillary: 165 mg/dL — ABNORMAL HIGH (ref 70–99)
Glucose-Capillary: 157 mg/dL — ABNORMAL HIGH (ref 70–99)
Glucose-Capillary: 170 mg/dL — ABNORMAL HIGH (ref 70–99)
Glucose-Capillary: 123 mg/dL — ABNORMAL HIGH (ref 70–99)

## 2023-04-15 LAB — CBC
HCT: 35 % — ABNORMAL LOW (ref 39.0–52.0)
Hemoglobin: 11.9 g/dL — ABNORMAL LOW (ref 13.0–17.0)
MCH: 32.4 pg (ref 26.0–34.0)
MCHC: 34 g/dL (ref 30.0–36.0)
MCV: 95.4 fL (ref 80.0–100.0)
Platelets: 178 10*3/uL (ref 150–400)
RBC: 3.67 MIL/uL — ABNORMAL LOW (ref 4.22–5.81)
RDW: 13.1 % (ref 11.5–15.5)
WBC: 8.6 10*3/uL (ref 4.0–10.5)
nRBC: 0 % (ref 0.0–0.2)

## 2023-04-15 LAB — HEPARIN LEVEL (UNFRACTIONATED): Heparin Unfractionated: 0.33 [IU]/mL (ref 0.30–0.70)

## 2023-04-15 MED ORDER — HYDROCODONE-ACETAMINOPHEN 10-325 MG PO TABS
1.0000 | ORAL_TABLET | Freq: Two times a day (BID) | ORAL | Status: DC | PRN
  Administered 2023-04-15 – 2023-04-18 (×6): 1 via ORAL
  Filled 2023-04-15 (×6): qty 1

## 2023-04-15 NOTE — Consult Note (Signed)
   Patient Name: David [name] Date of Encounter: 04/15/2023 [facility] Cardiologist: Maude Emmer, MD   Interval Summary  .    No acute overnight events. Some shortness of breath this morning that has since resolved. Otherwise well. No new or acute complaints.  Vital Signs .    Vitals:   04/15/23 0326 04/15/23 0500 04/15/23 0754 04/15/23 1123  BP:  110/76 133/89 124/77  Pulse:  99 62 64  Resp:  16 18 16   Temp:  98.1 F (36.7 C) 97.6 F (36.4 C) 98.3 F (36.8 C)  TempSrc:  Oral Oral Oral  SpO2:  95% 94% 95%  Weight: 81.5 kg     Height:        Intake/Output Summary (Last 24 hours) at 04/15/2023 1402 Last data filed at 04/15/2023 0753 Gross per 24 hour  Intake 328.66 ml  Output 700 ml  Net -371.34 ml      04/15/2023    3:26 AM 04/14/2023    4:35 AM 04/13/2023    5:05 AM  Last 3 Weights  Weight (lbs) 179 lb 11.2 oz 169 lb 5 oz 170 lb 13.7 oz  Weight (kg) 81.511 kg 76.8 kg 77.5 kg      Telemetry/ECG    AF - Personally Reviewed  Physical Exam .   GEN: No acute distress.   Neck: No JVD Cardiac: normal rate, irregular rhythm Respiratory: Clear to auscultation bilaterally. GI: Soft, nontender, non-distended  MS: No edema  Assessment & Plan .    David [name] is a 63 y.o. HFimpEF, DM with stage IIIa-B CKD, HTN, permanent atrial fibrillation, hx of MRSA bacteremia c/b osteomyelitis who was seen 04/10/2023 for the evaluation of abnormal EKG/elevated troponin/CHF suggestive of NSTEMI, underwent cardiac catheterization on Monday 2024 revealing significant three-vessel coronary disease and is now awaiting CABG.   1.  NSTEMI 2.  CAD of the native vessel with unstable angina 3.  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  Primary hypertension 5.  Hypercholesterolemia 6.  Permanent atrial fibrillation  Plan:  - continue heparin  gtt - continue ezetimibe  - continue crestor   - continue CABG eval  For  questions or updates, please contact [facility] Please consult [email or web address] for contact info under        Signed, Fonda Kitty, MD

## 2023-04-15 NOTE — Progress Notes (Signed)
                 Interval history Feels better than yesterday.  Denies worsening shortness of breath.  No chest discomfort.  Spirits are okay.  Still having some chronic back pain that is uncomfortable in between doses of long-acting opioid.  Physical exam Blood pressure 124/77, pulse 64, temperature 98.3 F (36.8 C), temperature source Oral, resp. rate 16, height 6' (1.829 m), weight 81.5 kg, SpO2 95%.  Comfortable appearing, sitting on the edge of bed eating breakfast Heart rate is normal, rhythm is regular, radial pulses strong Breathing comfortably on room air, lungs sound clear Skin is warm and dry Alert and oriented, no facial asymmetry, speech is normal sounding  Weight change: 4.711 kg   Intake/Output Summary (Last 24 hours) at 04/15/2023 1149 Last data filed at 04/15/2023 0753 Gross per 24 hour  Intake 328.66 ml  Output 1075 ml  Net -746.34 ml   Net IO Since Admission: -4,603.37 mL [04/15/23 1149]  Assessment and plan Hospital day 5  David [name] is a 63 y.o. with history of diabetes and systolic heart failure, recent prolonged admission for MRSA bacteremia complicated by osteomyelitis status post spinal surgery, presented with dyspnea on exertion and admitted for NSTEMI.  NSTEMI Coronary artery disease Stable, no dyspnea or chest pain today.  Undergoing preoperative evaluation for CABG due to his severe three-vessel coronary artery disease.  Continuing heparin  drip.  Metoprolol  on hold due to bradycardia.  Acute decompensated heart failure With recovered ejection fraction, EF of 65 to 70%.  Doing well on room air while eating breakfast today.  Status post a couple of days of IV Lasix , appears euvolemic.  Creatinine trend is improving.  Atrial fibrillation Permanent, rate is okay today.  Heparin  infusion for NSTEMI.  Bradycardia Improved.  Holding digoxin  and metoprolol  at this time.  Plan to restart metoprolol  with cardiology guidance, anticipate discontinuing  digoxin  as his ejection fraction is recovered.  AKI Improved after couple of days of Lasix .  Continues to trend down.  A.m. BMP.  Chronic pain syndrome Thoracic back is pain generator.  He is status post surgery for vertebral osteomyelitis in setting of MRSA.  Continue OxyContin  15 mg every 12 hours.  Will add home hydrocodone -acetaminophen  10-325 mg every 12 hours as he has some breakthrough pain in between his long-acting opioid doses.  Also multimodal pain therapy, with lidocaine  patch and Voltaren  gel.  MRSA infection Prolonged hospitalization several months ago for MRSA bacteremia complicated by vertebral osteomyelitis.  Continue suppressive doxycycline .  Type 2 diabetes with hyperglycemia CBG between 121 and 165 over the last 24 hours.  Continue glargine 32 units daily and moderate correctional dose insulin .  VTE prophylaxis: Heparin  infusion for NSTEMI Diet: Carb modified IVF: N/A Code: Full PT/OT recommendations: Outpatient therapy TOC recommendations: N/A Family Update: N/A  Discharge plan: Not until stabilized after CABG.  [name] MD 04/15/2023, 11:49 AM  Pager: [phone] After 5pm or weekend: [phone]

## 2023-04-15 NOTE — Progress Notes (Signed)
 VASCULAR LAB    Pre CABG Dopplers have been performed.  See CV proc for preliminary results.   Ophie Burrowes, RVT 04/15/2023, 10:55 AM

## 2023-04-15 NOTE — Progress Notes (Signed)
 PHARMACY - ANTICOAGULATION CONSULT NOTE  Pharmacy Consult for continuous infusion of unfractionated heparin  intravenously.  Indication: chest pain/ACS  No Known Allergies  Patient Measurements: Height: 6' (182.9 cm) Weight: 81.5 kg (179 lb 11.2 oz) IBW/kg (Calculated) : 77.6 Heparin  Dosing Weight: 76 kg  Vital Signs: Temp: 97.6 F (36.4 C) (01/11 0754) Temp Source: Oral (01/11 0754) BP: 133/89 (01/11 0754) Pulse Rate: 62 (01/11 0754)  Labs: Recent Labs    04/13/23 0304 04/13/23 0429 04/13/23 1232 04/14/23 0249 04/15/23 0219  HGB 12.8*  --   --  11.6* 11.9*  HCT 37.0*  --   --  34.1* 35.0*  PLT 194  --   --  168 178  APTT 42*  --   --   --   --   HEPARINUNFRC 0.25*  --  0.33 0.50 0.33  CREATININE 1.65*  --   --  1.59*  --   TROPONINIHS 51* 51*  --   --   --     Estimated Creatinine Clearance: 53.5 mL/min (A) (by C-G formula based on SCr of 1.59 mg/dL (H)).   Medical History: Past Medical History:  Diagnosis Date   Cardiomyopathy (HCC)    a. EF 45% in 2019.   CHF (congestive heart failure) (HCC)    Chronic anticoagulation 09/30/2017   Diabetes mellitus type 2 in nonobese [facility])    Discitis of thoracic region 09/25/2022   Does not have health insurance    Essential hypertension 09/26/2017   Financial difficulties    H/O noncompliance with medical treatment, presenting hazards to health    Hardware complicating wound infection (HCC) 09/25/2022   Non-insulin  treated type 2 diabetes mellitus (HCC) 09/26/2017   Persistent atrial fibrillation (HCC)    Sleep apnea suspected 09/30/2017    Medications:  Scheduled:   diclofenac  Sodium  4 g Topical QID   doxycycline   100 mg Oral Q12H   ezetimibe   10 mg Oral Daily   famotidine   20 mg Oral Daily   fluticasone   2 spray Each Nare Daily   insulin  aspart  0-15 Units Subcutaneous TID WC   insulin  glargine-yfgn  32 Units Subcutaneous QHS   lidocaine   1 patch Transdermal Q24H   melatonin  5 mg Oral QHS   oxyCODONE   15  mg Oral Q12H   pregabalin   75 mg Oral QHS   rosuvastatin   20 mg Oral Daily   sodium chloride  flush  3 mL Intravenous Q12H    Assessment: 63 y.o. male with h/o Afib and Eliquis  on hold, s/p cath and awaiting CABG, Pharmacy consulted for heparin  management.  Heparin  level 0.33 is therapeutic with heparin  running at 1400 units/hr. Hgb (11.9) and PLTs (178) are stable. Per RN, no report of pauses, issues with the line, or signs of bleeding.    Goal of Therapy:  Heparin  level 0.3-0.7 units/ml Monitor platelets by anticoagulation protocol: Yes   Plan:  Continue heparin  at 1400 units/hr Monitor daily heparin  level and CBC Monitor for signs/symptoms of bleeding F/u plan for CABG (tentatively 04/18/23)   Nidia Schaffer, PharmD PGY1 Pharmacy Resident  Please check AMION for all [facility] Pharmacy phone numbers After 10:00 PM, call Main Pharmacy [phone]  04/15/2023,9:34 AM

## 2023-04-15 NOTE — Plan of Care (Signed)

## 2023-04-16 DIAGNOSIS — G8929 Other chronic pain: Secondary | ICD-10-CM | POA: Diagnosis not present

## 2023-04-16 DIAGNOSIS — I214 Non-ST elevation (NSTEMI) myocardial infarction: Secondary | ICD-10-CM | POA: Diagnosis not present

## 2023-04-16 DIAGNOSIS — I4819 Other persistent atrial fibrillation: Secondary | ICD-10-CM | POA: Diagnosis not present

## 2023-04-16 DIAGNOSIS — M545 Low back pain, unspecified: Secondary | ICD-10-CM | POA: Diagnosis not present

## 2023-04-16 LAB — CBC
HCT: 38.5 % — ABNORMAL LOW (ref 39.0–52.0)
Hemoglobin: 12.7 g/dL — ABNORMAL LOW (ref 13.0–17.0)
MCH: 32.5 pg (ref 26.0–34.0)
MCHC: 33 g/dL (ref 30.0–36.0)
MCV: 98.5 fL (ref 80.0–100.0)
Platelets: 152 10*3/uL (ref 150–400)
RBC: 3.91 MIL/uL — ABNORMAL LOW (ref 4.22–5.81)
RDW: 13.2 % (ref 11.5–15.5)
WBC: 7.5 10*3/uL (ref 4.0–10.5)
nRBC: 0 % (ref 0.0–0.2)

## 2023-04-16 LAB — GLUCOSE, CAPILLARY
Glucose-Capillary: 132 mg/dL — ABNORMAL HIGH (ref 70–99)
Glucose-Capillary: 153 mg/dL — ABNORMAL HIGH (ref 70–99)
Glucose-Capillary: 138 mg/dL — ABNORMAL HIGH (ref 70–99)
Glucose-Capillary: 131 mg/dL — ABNORMAL HIGH (ref 70–99)

## 2023-04-16 LAB — VAS US DOPPLER PRE CABG

## 2023-04-16 LAB — HEPARIN LEVEL (UNFRACTIONATED): Heparin Unfractionated: 0.44 [IU]/mL (ref 0.30–0.70)

## 2023-04-16 NOTE — Consult Note (Addendum)
   Patient Name: David [name] Date of Encounter: 04/16/2023 [facility] Cardiologist: Maude Emmer, MD   Interval Summary  .    No acute overnight events. No CP or SOB. No new or acute complaints.  Vital Signs .    Vitals:   04/15/23 2020 04/15/23 2357 04/16/23 0541 04/16/23 0751  BP: (!) 138/91 129/83 112/77 (!) 154/95  Pulse: 70  73 73  Resp: 17 20 18 16   Temp: 97.6 F (36.4 C) 98 F (36.7 C) 98.1 F (36.7 C) 97.9 F (36.6 C)  TempSrc: Oral Oral Oral Oral  SpO2: 94% 96% 96% 96%  Weight:   81.9 kg   Height:        Intake/Output Summary (Last 24 hours) at 04/16/2023 1020 Last data filed at 04/16/2023 0542 Gross per 24 hour  Intake 394.21 ml  Output 1600 ml  Net -1205.79 ml      04/16/2023    5:41 AM 04/15/2023    3:26 AM 04/14/2023    4:35 AM  Last 3 Weights  Weight (lbs) 180 lb 8.9 oz 179 lb 11.2 oz 169 lb 5 oz  Weight (kg) 81.9 kg 81.511 kg 76.8 kg      Telemetry/ECG    AF - Personally Reviewed  Physical Exam .   GEN: No acute distress.   Neck: No JVD Cardiac: normal rate, irregular rhythm Respiratory: Clear to auscultation bilaterally. GI: Soft, nontender, non-distended  MS: No edema  Assessment & Plan .    David [name] is a 63 y.o. HFimpEF, DM with stage IIIa-B CKD, HTN, permanent atrial fibrillation, hx of MRSA bacteremia c/b osteomyelitis who was seen 04/10/2023 for the evaluation of abnormal EKG/elevated troponin/CHF suggestive of NSTEMI, underwent cardiac catheterization on Monday 2024 revealing significant three-vessel coronary disease and is now awaiting CABG.   1.  NSTEMI 2.  CAD of the native vessel with unstable angina 3.  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  Primary hypertension 5.  Hypercholesterolemia 6.  Permanent atrial fibrillation  Plan:  - Continue heparin  gtt. - Continue ezetimibe . - Continue Crestor . - CABG planned for Tuesday. Given permanent AF, consider  left atrial appendage clipping at time of surgery to reduce risk of stroke.  For questions or updates, please contact [facility] Please consult [email or web address] for contact info under        Signed, Fonda Kitty, MD

## 2023-04-16 NOTE — Progress Notes (Signed)
 PHARMACY - ANTICOAGULATION CONSULT NOTE  Pharmacy Consult for continuous infusion of unfractionated heparin  intravenously.  Indication: chest pain/ACS  No Known Allergies  Patient Measurements: Height: 6' (182.9 cm) Weight: 81.9 kg (180 lb 8.9 oz) IBW/kg (Calculated) : 77.6 Heparin  Dosing Weight: 76 kg  Vital Signs: Temp: 97.9 F (36.6 C) (01/12 0751) Temp Source: Oral (01/12 0751) BP: 154/95 (01/12 0751) Pulse Rate: 73 (01/12 0751)  Labs: Recent Labs    04/14/23 0249 04/15/23 0219 04/16/23 0236  HGB 11.6* 11.9* 12.7*  HCT 34.1* 35.0* 38.5*  PLT 168 178 152  HEPARINUNFRC 0.50 0.33 0.44  CREATININE 1.59*  --   --     Estimated Creatinine Clearance: 53.5 mL/min (A) (by C-G formula based on SCr of 1.59 mg/dL (H)).   Medical History: Past Medical History:  Diagnosis Date   Cardiomyopathy (HCC)    a. EF 45% in 2019.   CHF (congestive heart failure) (HCC)    Chronic anticoagulation 09/30/2017   Diabetes mellitus type 2 in nonobese [facility])    Discitis of thoracic region 09/25/2022   Does not have health insurance    Essential hypertension 09/26/2017   Financial difficulties    H/O noncompliance with medical treatment, presenting hazards to health    Hardware complicating wound infection (HCC) 09/25/2022   Non-insulin  treated type 2 diabetes mellitus (HCC) 09/26/2017   Persistent atrial fibrillation (HCC)    Sleep apnea suspected 09/30/2017    Medications:  Scheduled:   diclofenac  Sodium  4 g Topical QID   doxycycline   100 mg Oral Q12H   ezetimibe   10 mg Oral Daily   famotidine   20 mg Oral Daily   fluticasone   2 spray Each Nare Daily   insulin  aspart  0-15 Units Subcutaneous TID WC   insulin  glargine-yfgn  32 Units Subcutaneous QHS   lidocaine   1 patch Transdermal Q24H   melatonin  5 mg Oral QHS   oxyCODONE   15 mg Oral Q12H   pregabalin   75 mg Oral QHS   rosuvastatin   20 mg Oral Daily   sodium chloride  flush  3 mL Intravenous Q12H    Assessment: 63 y.o.  male with h/o Afib and Eliquis  on hold, s/p cath and awaiting CABG, Pharmacy consulted for heparin  management.  Heparin  level 0.44 is therapeutic with heparin  running at 1400 units/hr. Hgb (12.7) and PLTs (152) are stable. Per RN, no report of pauses, issues with the line, or signs of bleeding.    Goal of Therapy:  Heparin  level 0.3-0.7 units/ml Monitor platelets by anticoagulation protocol: Yes   Plan:  Continue heparin  at 1400 units/hr Monitor daily heparin  level and CBC Monitor for signs/symptoms of bleeding F/u plan for CABG (tentatively 04/18/23)   Nidia Schaffer, PharmD PGY1 Pharmacy Resident  Please check AMION for all [facility] Pharmacy phone numbers After 10:00 PM, call Main Pharmacy [phone] 04/16/2023,7:52 AM

## 2023-04-16 NOTE — Plan of Care (Signed)

## 2023-04-16 NOTE — Progress Notes (Signed)
                 Interval history No new concerns since yesterday.  No chest pain overnight.  Breathing feels okay.  Pain feels improved since the addition of hydrocodone  yesterday.  Physical exam Blood pressure 123/81, pulse (!) 44, temperature 98 F (36.7 C), temperature source Oral, resp. rate 20, height 6' (1.829 m), weight 81.9 kg, SpO2 95%.  Comfortable appearing Heart rates normal rhythm is irregular, strong radial pulse Breathing comfortably on room air, lungs sound clear Skin is warm and dry Alert and oriented, no facial asymmetry, speech is normal sounding  Assessment and plan Hospital day 6  David [name] is a 63 y.o. with history of diabetes and systolic heart failure, recent prolonged admission for MRSA bacteremia complicated by osteomyelitis status post spinal surgery, presented with dyspnea on exertion and admitted for NSTEMI.   Principal Problem:   NSTEMI (non-ST elevated myocardial infarction) (HCC)   Coronary artery disease involving coronary bypass graft of native heart with unstable angina pectoris (HCC) Stable, no dyspnea or chest pain today.  Undergoing preoperative evaluation for CABG due to his severe three-vessel coronary artery disease.  Continuing heparin  drip.  Metoprolol  on hold due to bradycardia.  Active Problems:   Acute decompensated heart failure (HCC)   Acute on chronic diastolic (congestive) heart failure (HCC) With recovered ejection fraction, EF of 65 to 70%.  Doing well on room air while eating breakfast today.  Status post a couple of days of IV Lasix , appears euvolemic.  Creatinine trend is improving.     Primary hypertension Outpatient beta-blocker and Entresto  has been held.    Type 2 diabetes mellitus (HCC) CBG between 121 and 165 over the last 24 hours.  Continue glargine 32 units daily and moderate correctional dose insulin .    Atrial fibrillation (HCC) Permanent, rate is okay today.  Heparin  infusion for NSTEMI.    AKI (acute  kidney injury), ruled out Improved after couple of days of Lasix .  Continues to trend down.  A.m. BMP.    Chronic pain syndrome   Chronic bilateral low back pain Thoracic back is pain generator.  He is status post surgery for vertebral osteomyelitis in setting of MRSA.  Continue OxyContin  15 mg every 12 hours.  Will add home hydrocodone -acetaminophen  10-325 mg every 12 hours as he has some breakthrough pain in between his long-acting opioid doses.  Also multimodal pain therapy, with lidocaine  patch and Voltaren  gel.    MRSA infection greater than 3 months ago Prolonged hospitalization several months ago for MRSA bacteremia complicated by vertebral osteomyelitis.  Continue suppressive doxycycline .    BradycardiaImproved.  Holding digoxin  and metoprolol  at this time.  Plan to restart metoprolol  with cardiology guidance, anticipate discontinuing digoxin  as his ejection fraction is recovered.  Resolved Problems:   Elevated troponin  VTE prophylaxis:  Diet: Carb modified IVF: N/A Code: Full PT/OT recommendations: Outpatient therapy TOC recommendations: N/A Family Update: N/A   Discharge plan: Not until stabilized after CABG.  David Kung MD 04/16/2023, 12:20 PM  Pager: [phone] After 5pm or weekend: [phone]

## 2023-04-17 DIAGNOSIS — I2510 Atherosclerotic heart disease of native coronary artery without angina pectoris: Secondary | ICD-10-CM | POA: Diagnosis not present

## 2023-04-17 DIAGNOSIS — I214 Non-ST elevation (NSTEMI) myocardial infarction: Secondary | ICD-10-CM | POA: Diagnosis not present

## 2023-04-17 LAB — BASIC METABOLIC PANEL
Anion gap: 8 (ref 5–15)
BUN: 28 mg/dL — ABNORMAL HIGH (ref 8–23)
CO2: 22 mmol/L (ref 22–32)
Calcium: 9.1 mg/dL (ref 8.9–10.3)
Chloride: 104 mmol/L (ref 98–111)
Creatinine, Ser: 1.77 mg/dL — ABNORMAL HIGH (ref 0.61–1.24)
GFR, Estimated: 43 mL/min — ABNORMAL LOW (ref >60)
Glucose, Bld: 167 mg/dL — ABNORMAL HIGH (ref 70–99)
Potassium: 4.1 mmol/L (ref 3.5–5.1)
Sodium: 134 mmol/L — ABNORMAL LOW (ref 135–145)

## 2023-04-17 LAB — GLUCOSE, CAPILLARY
Glucose-Capillary: 135 mg/dL — ABNORMAL HIGH (ref 70–99)
Glucose-Capillary: 120 mg/dL — ABNORMAL HIGH (ref 70–99)
Glucose-Capillary: 190 mg/dL — ABNORMAL HIGH (ref 70–99)
Glucose-Capillary: 190 mg/dL — ABNORMAL HIGH (ref 70–99)

## 2023-04-17 LAB — TYPE AND SCREEN
ABO/RH(D): O NEG
Antibody Screen: NEGATIVE

## 2023-04-17 LAB — MAGNESIUM: Magnesium: 1.8 mg/dL (ref 1.7–2.4)

## 2023-04-17 LAB — HEPARIN LEVEL (UNFRACTIONATED): Heparin Unfractionated: 0.37 [IU]/mL (ref 0.30–0.70)

## 2023-04-17 MED ORDER — CHLORHEXIDINE GLUCONATE 0.12 % MT SOLN
15.0000 mL | Freq: Once | OROMUCOSAL | Status: AC
Start: 2023-04-18 — End: 2023-04-18
  Administered 2023-04-18: 15 mL via OROMUCOSAL
  Filled 2023-04-17: qty 15

## 2023-04-17 MED ORDER — EPINEPHRINE HCL 5 MG/250ML IV SOLN IN NS
0.0000 ug/min | INTRAVENOUS | Status: AC
  Administered 2023-04-18: 2 ug/min via INTRAVENOUS
  Filled 2023-04-17: qty 250

## 2023-04-17 MED ORDER — MANNITOL 20 % IV SOLN
INTRAVENOUS | Status: DC
  Filled 2023-04-17: qty 13

## 2023-04-17 MED ORDER — CHLORHEXIDINE GLUCONATE CLOTH 2 % EX PADS
6.0000 | MEDICATED_PAD | Freq: Once | CUTANEOUS | Status: AC
  Administered 2023-04-17: 6 via TOPICAL

## 2023-04-17 MED ORDER — HEPARIN 30,000 UNITS/1000 ML (OHS) CELLSAVER SOLUTION
Status: DC
  Filled 2023-04-17: qty 1000

## 2023-04-17 MED ORDER — INSULIN REGULAR(HUMAN) IN NACL 100-0.9 UT/100ML-% IV SOLN
INTRAVENOUS | Status: AC
  Administered 2023-04-18: 1.5 [IU]/h via INTRAVENOUS
  Filled 2023-04-17: qty 100

## 2023-04-17 MED ORDER — SENNOSIDES-DOCUSATE SODIUM 8.6-50 MG PO TABS
1.0000 | ORAL_TABLET | Freq: Two times a day (BID) | ORAL | Status: DC
  Administered 2023-04-17 (×2): 1 via ORAL
  Filled 2023-04-17 (×2): qty 1

## 2023-04-17 MED ORDER — MILRINONE LACTATE IN DEXTROSE 20-5 MG/100ML-% IV SOLN
0.3000 ug/kg/min | INTRAVENOUS | Status: DC
  Filled 2023-04-17: qty 100

## 2023-04-17 MED ORDER — POLYETHYLENE GLYCOL 3350 17 G PO PACK: 17.0000 g | PACK | Freq: Every day | ORAL | Status: DC | PRN

## 2023-04-17 MED ORDER — NOREPINEPHRINE 4 MG/250ML-% IV SOLN
0.0000 ug/min | INTRAVENOUS | Status: DC
  Filled 2023-04-17: qty 250

## 2023-04-17 MED ORDER — CEFAZOLIN SODIUM-DEXTROSE 2-4 GM/100ML-% IV SOLN
2.0000 g | INTRAVENOUS | Status: DC
  Filled 2023-04-17: qty 100

## 2023-04-17 MED ORDER — PHENYLEPHRINE HCL-NACL 20-0.9 MG/250ML-% IV SOLN
30.0000 ug/min | INTRAVENOUS | Status: AC
  Administered 2023-04-18: 30 ug/min via INTRAVENOUS
  Filled 2023-04-17: qty 250

## 2023-04-17 MED ORDER — POTASSIUM CHLORIDE 2 MEQ/ML IV SOLN
80.0000 meq | INTRAVENOUS | Status: DC
  Filled 2023-04-17: qty 40

## 2023-04-17 MED ORDER — BISACODYL 5 MG PO TBEC
5.0000 mg | DELAYED_RELEASE_TABLET | Freq: Once | ORAL | Status: AC
  Administered 2023-04-17: 5 mg via ORAL
  Filled 2023-04-17: qty 1

## 2023-04-17 MED ORDER — METOPROLOL TARTRATE 12.5 MG HALF TABLET
12.5000 mg | ORAL_TABLET | Freq: Once | ORAL | Status: DC
  Filled 2023-04-17: qty 1

## 2023-04-17 MED ORDER — DEXMEDETOMIDINE HCL IN NACL 400 MCG/100ML IV SOLN
0.1000 ug/kg/h | INTRAVENOUS | Status: AC
  Administered 2023-04-18: .3 ug/kg/h via INTRAVENOUS
  Filled 2023-04-17: qty 100

## 2023-04-17 MED ORDER — TRANEXAMIC ACID 1000 MG/10ML IV SOLN
1.5000 mg/kg/h | INTRAVENOUS | Status: AC
  Administered 2023-04-18: 1.5 mg/kg/h via INTRAVENOUS
  Filled 2023-04-17: qty 25

## 2023-04-17 MED ORDER — CEFAZOLIN SODIUM-DEXTROSE 2-4 GM/100ML-% IV SOLN
2.0000 g | INTRAVENOUS | Status: AC
  Administered 2023-04-18 (×2): 2 g via INTRAVENOUS
  Filled 2023-04-17: qty 100

## 2023-04-17 MED ORDER — TRANEXAMIC ACID (OHS) PUMP PRIME SOLUTION
2.0000 mg/kg | INTRAVENOUS | Status: DC
  Filled 2023-04-17: qty 1.7

## 2023-04-17 MED ORDER — DIAZEPAM 5 MG PO TABS
5.0000 mg | ORAL_TABLET | Freq: Once | ORAL | Status: DC
  Filled 2023-04-17: qty 1

## 2023-04-17 MED ORDER — PLASMA-LYTE A IV SOLN
INTRAVENOUS | Status: DC
  Filled 2023-04-17: qty 2.5

## 2023-04-17 MED ORDER — TRANEXAMIC ACID (OHS) BOLUS VIA INFUSION
15.0000 mg/kg | INTRAVENOUS | Status: AC
  Administered 2023-04-18: 1275 mg via INTRAVENOUS
  Filled 2023-04-17: qty 1275

## 2023-04-17 MED ORDER — NITROGLYCERIN IN D5W 200-5 MCG/ML-% IV SOLN
2.0000 ug/min | INTRAVENOUS | Status: DC
  Filled 2023-04-17: qty 250

## 2023-04-17 MED ORDER — POLYETHYLENE GLYCOL 3350 17 G PO PACK: 17.0000 g | PACK | Freq: Two times a day (BID) | ORAL | Status: DC | PRN

## 2023-04-17 MED ORDER — VANCOMYCIN HCL 1.5 G IV SOLR
1500.0000 mg | INTRAVENOUS | Status: AC
  Administered 2023-04-18: 1500 mg via INTRAVENOUS
  Filled 2023-04-17: qty 30

## 2023-04-17 NOTE — Progress Notes (Addendum)
 Physical Therapy Treatment  Patient Details Name: David [name] MRN: [id number] DOB: December 08, 1960 Today's Date: 04/17/2023   History of Present Illness Patient is 63 y.o. male who presented with dyspnea and is admitted for acute heart failure exacerbation. Of note pt had extended hospital stay from 08/20/22-10/28/22 during which he underwent surgical repair 08/30/22: T5-11 posterior/posterolateral arthrodesis  & Transpedicular decompression with partial corpectomy, left T8 & T8-9 laminectomy, bilateral facetectomy. 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 Since discharge in May 2024 pt has represented to ED for back pain x3 and for fall x1.    PT Comments  Pt admitted with above diagnosis. Pt was able to ambulate without device without LOB.  Noted left LE weakness with fatigue. Pt to have CABG tomorrow and will need re-evaluation tomorrow.  Pt currently with functional limitations due to the deficits listed below (see PT Problem List). Pt will benefit from acute skilled PT to increase their independence and safety with mobility to allow discharge.       If plan is discharge home, recommend the following: Assist for transportation;Help with stairs or ramp for entrance   Can travel by private vehicle        Equipment Recommendations  Other (comment) (TBA after surgery)    Recommendations for Other Services       Precautions / Restrictions Precautions Precautions: Fall Restrictions Weight Bearing Restrictions Per Provider Order: No     Mobility  Bed Mobility Overal bed mobility: Modified Independent Bed Mobility: Supine to Sit, Sit to Supine     Supine to sit: Modified independent (Device/Increase time) Sit to supine: Modified independent (Device/Increase time)         Transfers Overall transfer level: Needs assistance Equipment used: None Transfers: Sit to/from Stand Sit to Stand: Contact guard assist, Supervision           General transfer comment: no physical assist needed    Ambulation/Gait Ambulation/Gait assistance: Contact guard assist Gait Distance (Feet): 400 Feet Assistive device: IV Pole Gait Pattern/deviations: Step-through pattern, Wide base of support, Drifts right/left, Decreased stride length Gait velocity: decr     General Gait Details: amb hallawy with IV pole, pt continues to have wide base for stability, mildly increased sway with gait and CGA for safety due to left LE weakness when fatigued. Did not have LOB but left LE does get fatigued and pt has more difficulty with swing phase of gait.   Stairs             Wheelchair Mobility     Tilt Bed    Modified Rankin (Stroke Patients Only)       Balance Overall balance assessment: Needs assistance Sitting-balance support: Feet supported Sitting balance-Leahy Scale: Good     Standing balance support: During functional activity, No upper extremity supported, Single extremity supported Standing balance-Leahy Scale: Good                              Cognition Arousal: Alert Behavior During Therapy: WFL for tasks assessed/performed Overall Cognitive Status: Within Functional Limits for tasks assessed  Exercises      General Comments General comments (skin integrity, edema, etc.): Practiced sit to stand without use of UEs for prep for CABG.  78-109 bpm      Pertinent Vitals/Pain Pain Assessment Pain Assessment: No/denies pain    Home Living                          Prior Function            PT Goals (current goals can now be found in the care plan section) Progress towards PT goals: Progressing toward goals    Frequency    Min 1X/week      PT Plan       Co-evaluation              AM-PAC PT 6 Clicks Mobility   Outcome Measure  Help needed turning from your back to your side while in a flat bed without using bedrails?: None Help needed moving from lying on your back to sitting on the side of a flat bed without using bedrails?: None Help needed moving to and from a bed to a chair (including a wheelchair)?: A Little Help needed standing up from a chair using your arms (e.g., wheelchair or bedside chair)?: A Little Help needed to walk in hospital room?: A Little Help needed climbing 3-5 steps with a railing? : A Little 6 Click Score: 20    End of Session Equipment Utilized During Treatment: Gait belt Activity Tolerance: Patient tolerated treatment well Patient left: with call bell/phone within reach (sitting EOB as lunch came) Nurse Communication: Mobility status PT Visit Diagnosis: Unsteadiness on feet (R26.81);Difficulty in walking, not elsewhere classified (R26.2);Other symptoms and signs involving the nervous system (R29.898);Pain     Time: 1150-1205 PT Time Calculation (min) (ACUTE ONLY): 15 min  Charges:    $Gait Training: 8-22 mins PT General Charges $$ ACUTE PT VISIT: 1 Visit                     Reyhan Moronta M,PT Acute Rehab Services [phone]    Stephane JULIANNA Bevel 04/17/2023, 1:39 PM

## 2023-04-17 NOTE — Progress Notes (Signed)
 Summary: David [name] is a 63 y.o. 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    Hospital Day: 8  Subjective:  Patient seen and evaluated while resting in bed. Patient reports he has not been sleeping much due to be woken up throughout the night and day. He denies CP, SOB or abdominal pain. He reports last bowel movement was day before yesterday and that he has experienced bloating. He requests assistance to have more BMs. He denies wanting to restart a medication for anxiety.  Objective:  Vital signs in last 24 hours: Vitals:   04/16/23 1950 04/17/23 0016 04/17/23 0122 04/17/23 0518  BP: (!) 141/76 (!) 152/93 (!) 155/99 (!) 162/93  Pulse: 67 68 60 62  Resp: 17 16 18 19   Temp: 97.6 F (36.4 C)  97.9 F (36.6 C) 97.8 F (36.6 C)  TempSrc: Oral Oral Oral Oral  SpO2: 96% 94% 95% 96%  Weight:    85 kg  Height:       Physical Exam General: NAD, asleep prior to exam; tired-appearing Cardiovascular: Irregularly irregular rhythm, normal rate Respiratory: normal work of breathing; clear to auscultation bilaterally Abdominal: soft, non-distended; normal bowel sounds Extremities: no peripheral edema present   Weight change: 3.1 kg  Intake/Output Summary (Last 24 hours) at 04/17/2023 0709 Last data filed at 04/17/2023 9380 Gross per 24 hour  Intake 1176.85 ml  Output 2190 ml  Net -1013.15 ml      Latest Ref Rng & Units 04/17/2023    3:01 AM 04/14/2023    2:49 AM 04/13/2023    3:04 AM  CMP  Glucose 70 - 99 mg/dL 832  838  825   BUN 8 - 23 mg/dL 28  28  36   Creatinine 0.61 - 1.24 mg/dL 8.22  8.40  8.34   Sodium 135 - 145 mmol/L 134  135  136   Potassium 3.5 - 5.1 mmol/L 4.1  4.6  4.8   Chloride 98 - 111 mmol/L 104  106  106   CO2 22 - 32 mmol/L 22  21  21     Calcium  8.9 - 10.3 mg/dL 9.1  9.2  9.3    Assessment/Plan:  Principal Problem:   NSTEMI (non-ST elevated myocardial infarction) (HCC) Active Problems:   Primary hypertension   Type 2 diabetes mellitus (HCC)   Atrial fibrillation (HCC)   AKI (acute kidney injury), ruled out   Chronic pain syndrome   MRSA infection greater than 3 months ago   Acute decompensated heart failure (HCC)   Coronary artery disease involving coronary bypass graft of native heart with unstable angina pectoris (HCC)   Acute on chronic diastolic (congestive) heart failure (HCC)   Chronic bilateral low back pain   Bradycardia  NSTEMI CAD involving coronary bypass graft of native heart with unstable angina pectoris  Stable, no dyspnea or chest pain reported today. Cardiology and CT surgery following, appreciate recommendations. Patient scheduled for CABG tomorrow due to his severe three-vessel coronary artery disease. Continuing heparin  drip. Metoprolol  on hold due to prior bradycardic episodes.   Acute decompensated HF Acute on chronic diastolic (congestive) HF  AKI Recovered EF of 65-70%. Appears euvolemic on exam today. Creatinine 1.77, increased from 1.59 on 1/10. - BMP tomorrow AM  Primary hypertension Continue to hold outpatient  beta-blocker and Entresto .   Type 2 diabetes mellitus  CBGs ranging from 120-135 overnight. Will continue glargine 32 units daily and SSI.  Atrial fibrillation  Appears to be permanent. Regular rate. Heparin  infusion for NSTEMI.  Chronic pain syndrome Chronic bilateral low back pain S/p spinal surgery for for vertebral osteomyelitis in May 2024 in setting of MRSA. - Continue OxyContin  15 mg every 12 hours - Continue home hydrocodone -acetaminophen  10-325 mg every 12 hours PRN - Continue idocaine patch and Voltaren  gel  MRSA infection greater than 3 months ago  - Continue suppressive doxycycline    Bradycardia Appears to be improving. Continue holding digoxin  and  metoprolol . Cardiology following, appreciate recommendations.   Decreased bowel movements Patient reports decreased bowel movements in addition to bloating; suspect this is 2/2 to opioids. Will start Senna. - Start senna-docusate 1 tablet, BID  VTE prophylaxis: IV heparin  Diet: Carb modified IVF: N/A Code: Full   Discharge plan: Not until stabilized after CABG.   LOS: 7 days   Saunders Damien MATSU, Medical Student 04/17/2023, 7:09 AM

## 2023-04-17 NOTE — Progress Notes (Signed)
   04/17/23 1457  Spiritual Encounters  Type of Visit Initial  Care provided to: Patient  Conversation partners present during encounter Nurse  Referral source Chaplain team  Reason for visit Routine spiritual support   Chaplain returning to follow-up with Pt I met last week who is having triple bypass surgery in the morning.  Pt was with RN preparing for exercise and it was not a good time for a visit.  Chaplain promised to return before 5 and pray for Pt.  Chaplain services remain available by Spiritual Consult or for emergent cases, paging [phone] Chaplain Maude Roll, MDiv [email or web address] [phone]

## 2023-04-17 NOTE — Plan of Care (Signed)
  Problem: Clinical Measurements: Goal: Will remain free from infection Outcome: Progressing   Problem: Nutrition: Goal: Adequate nutrition will be maintained Outcome: Progressing   Problem: Activity: Goal: Risk for activity intolerance will decrease Outcome: Progressing   Problem: Safety: Goal: Ability to remain free from injury will improve Outcome: Progressing

## 2023-04-17 NOTE — Progress Notes (Addendum)
 Patient Name: David [name] Date of Encounter: 04/17/2023 [facility] Cardiologist: Maude Emmer, MD   Interval Summary  .    Patient reports no change in any of his symptoms. No chest pain, shortness of breath, palpitations. He is mainly complaining of not being able to get any sleep with everyone coming into his room in the middle of the night. He has been up and ambulating without any issues. He is ready to undergo his procedure tomorrow.   Vital Signs .    Vitals:   04/17/23 0016 04/17/23 0122 04/17/23 0518 04/17/23 0820  BP: (!) 152/93 (!) 155/99 (!) 162/93 117/74  Pulse: 68 60 62 63  Resp: 16 18 19 16   Temp:  97.9 F (36.6 C) 97.8 F (36.6 C) 97.8 F (36.6 C)  TempSrc: Oral Oral Oral Oral  SpO2: 94% 95% 96% 92%  Weight:   85 kg   Height:        Intake/Output Summary (Last 24 hours) at 04/17/2023 0858 Last data filed at 04/17/2023 0817 Gross per 24 hour  Intake 1533.85 ml  Output 2190 ml  Net -656.15 ml      04/17/2023    5:18 AM 04/16/2023    5:41 AM 04/15/2023    3:26 AM  Last 3 Weights  Weight (lbs) 187 lb 6.3 oz 180 lb 8.9 oz 179 lb 11.2 oz  Weight (kg) 85 kg 81.9 kg 81.511 kg     Telemetry/ECG    Atrial fibrillation, HR 79 - Personally Reviewed  Physical Exam .   GEN: No acute distress.   Neck: No JVD Cardiac: regular rate, irregular rhythm, no murmurs.  Respiratory: Clear to auscultation bilaterally. GI: Soft, nontender, non-distended  MS: No edema  Assessment & Plan .     NSTEMI CAD of native vessel with unstable angina Patient presented to hospital with dyspnea on exertion 04/10/23  Elevated troponins 210>259 in ED EKG showed TWI in anterolateral leads and STE in aVR on admission  Patient denied any chest pain  Patient underwent cardiac cath on 04/12/23 which showed: complex, heavily calcified multi-vessel CAD. CT surgery consulted recommended CABG, pulmonary vein isolation ablation and LAA clip tomorrow 1/14 Patient denies any current  shortness of breath or chest pain. Ready for his procedure tomorrow  -- Continue IV heparin    Heart failure with improved EF History of LVEF 30-35% but improved on recent echo to 65-70% Echo done 04/11/23 showed: LVEF of 65-70%, no RWMA, mildly reduced RV systolic function, moderately dilated left atrium, mild MR, dilated IVC BNP 419, CXR showed pulmonary edema   Hypertension Holding off on BB and ACE/ARB due to bradycardia and with CABG on Tuesday  Most recent BP is 117/74 with HR 63  Permanent atrial fibrillation Patient has known history of permanent atrial fibrillation with prior failed DCCV Plan for patient to undergo pulmonary vein isolation ablation and LAA clip during CABG tomorrow  Patient has been rate controlled without continuing his digoxin , recent HR 60-76 -- Continue IV heparin  for [facility]  Hypercholesterolemia Lipid panel from 01/2023 showed: total 199, HDL 36, LDL 121, trigs 235 -- Continue Zetia  10 mg daily -- Continue Crestor  20 mg daily   Per primary Type 2 DM Chronic pain syndrome CKD Stage 3b  For questions or updates, please contact [facility] Please consult [email or web address] for contact info under     Signed, Waddell DELENA Donath, PA-C   ATTENDING ATTESTATION:  After conducting a review of all available clinical information with  the care team, interviewing the patient, and performing a physical exam, I agree with the findings and plan described in this note.   GEN: No acute distress.   HEENT:  MMM, no JVD, no scleral icterus Cardiac: RRR, no murmurs, rubs, or gallops.  Respiratory: Clear to auscultation bilaterally. GI: Soft, mildly distended MS: No edema; No deformity. Neuro:  Nonfocal  Vasc:  +2 radial pulses  Patient remains chest pain free and denies dyspnea.  Continue current therapy with heparin  gtt for ACS/AF.  CABG tomorrow.  Will optimize GDMT after CABG.  He is constipated, will give Miralax  today.  Lurena Red, MD Pager  [phone]

## 2023-04-17 NOTE — Progress Notes (Signed)
 PHARMACY - ANTICOAGULATION CONSULT NOTE  Pharmacy Consult for continuous infusion of unfractionated heparin  intravenously.  Indication: chest pain/ACS  No Known Allergies  Patient Measurements: Height: 6' (182.9 cm) Weight: 85 kg (187 lb 6.3 oz) IBW/kg (Calculated) : 77.6 Heparin  Dosing Weight: 76 Kg.  Vital Signs: Temp: 97.8 F (36.6 C) (01/13 0820) Temp Source: Oral (01/13 0820) BP: 117/74 (01/13 0820) Pulse Rate: 63 (01/13 0820)  Labs: Recent Labs    04/15/23 0219 04/16/23 0236 04/17/23 0301  HGB 11.9* 12.7*  --   HCT 35.0* 38.5*  --   PLT 178 152  --   HEPARINUNFRC 0.33 0.44 0.37  CREATININE  --   --  1.77*    Estimated Creatinine Clearance: 48.1 mL/min (A) (by C-G formula based on SCr of 1.77 mg/dL (H)).   Medical History: Past Medical History:  Diagnosis Date   Cardiomyopathy (HCC)    a. EF 45% in 2019.   CHF (congestive heart failure) (HCC)    Chronic anticoagulation 09/30/2017   Diabetes mellitus type 2 in nonobese [facility])    Discitis of thoracic region 09/25/2022   Does not have health insurance    Essential hypertension 09/26/2017   Financial difficulties    H/O noncompliance with medical treatment, presenting hazards to health    Hardware complicating wound infection (HCC) 09/25/2022   Non-insulin  treated type 2 diabetes mellitus (HCC) 09/26/2017   Persistent atrial fibrillation (HCC)    Sleep apnea suspected 09/30/2017    Medications:  Scheduled:   diclofenac  Sodium  4 g Topical QID   doxycycline   100 mg Oral Q12H   ezetimibe   10 mg Oral Daily   famotidine   20 mg Oral Daily   fluticasone   2 spray Each Nare Daily   insulin  aspart  0-15 Units Subcutaneous TID WC   insulin  glargine-yfgn  32 Units Subcutaneous QHS   lidocaine   1 patch Transdermal Q24H   melatonin  5 mg Oral QHS   oxyCODONE   15 mg Oral Q12H   pregabalin   75 mg Oral QHS   rosuvastatin   20 mg Oral Daily   senna-docusate  1 tablet Oral BID   sodium chloride  flush  3 mL  Intravenous Q12H    Assessment: 63 y.o. male with h/o Afib and Eliquis  on hold, s/p cath and awaiting CABG scheduled for Tuesday 14-JAN-25, Pharmacy consulted for heparin  management.   Heparin  level 0.37 units/mL is therapeutic with heparin  running at 1400 units/hr. Hgb (12.7) and PLTs (152)  collected and reported on 12-JAN-25 are stable. Per RN, no report of pauses, issues with the line, or signs of bleeding.   Goal of Therapy:  Heparin  level 0.3-0.7 units/ml Monitor platelets by anticoagulation protocol: Yes   Plan:  Continue heparin  infusion at 1400 units/hr. Continue to monitor H&H and platelets. Monitor for signs/symptoms of bleeding.   Lynwood KATHEE Lites, PharmD, CPP 04/17/2023,9:02 AM

## 2023-04-17 NOTE — H&P (View-Only) (Signed)
 5 Days Post-Op Procedure(s) (LRB): LEFT HEART CATH AND CORONARY ANGIOGRAPHY (N/A) Subjective: No chest pain or shortness of breath. Only chronic back pain from prior surgery.  Objective: Vital signs in last 24 hours: Temp:  [97.6 F (36.4 C)-98.1 F (36.7 C)] 98.1 F (36.7 C) (01/13 1212) Pulse Rate:  [60-69] 63 (01/13 1212) Cardiac Rhythm: Atrial fibrillation (01/13 0700) Resp:  [16-19] 16 (01/13 1212) BP: (117-185)/(74-128) 185/128 (01/13 1212) SpO2:  [92 %-97 %] 92 % (01/13 0820) Weight:  [85 kg] 85 kg (01/13 0518)  Hemodynamic parameters for last 24 hours:    Intake/Output from previous day: 01/12 0701 - 01/13 0700 In: 1176.9 [P.O.:690; I.V.:486.9] Out: 2190 [Urine:2190] Intake/Output this shift: Total I/O In: 357 [P.O.:357] Out: 450 [Urine:450]  General appearance: alert and cooperative Heart: regular rate and rhythm Lungs: clear to auscultation bilaterally  Lab Results: Recent Labs    04/15/23 0219 04/16/23 0236  WBC 8.6 7.5  HGB 11.9* 12.7*  HCT 35.0* 38.5*  PLT 178 152   BMET:  Recent Labs    04/17/23 0301  NA 134*  K 4.1  CL 104  CO2 22  GLUCOSE 167*  BUN 28*  CREATININE 1.77*  CALCIUM  9.1    PT/INR: No results for input(s): LABPROT, INR in the last 72 hours. ABG    Component Value Date/Time   PHART 7.354 08/30/2022 1639   HCO3 21.1 08/30/2022 1639   TCO2 22 08/30/2022 1639   ACIDBASEDEF 4.0 (H) 08/30/2022 1639   O2SAT 100 08/30/2022 1639   CBG (last 3)  Recent Labs    04/16/23 2126 04/17/23 0520 04/17/23 1209  GLUCAP 131* 135* 120*    Assessment/Plan:  Stable for CABG and pulmonary vein isolation ablation, clipping of LAA tomorrow. Creat up slightly today to 1.77. Not on any diuretic.  LOS: 7 days    David [name] 04/17/2023

## 2023-04-17 NOTE — Progress Notes (Signed)
 5 Days Post-Op Procedure(s) (LRB): LEFT HEART CATH AND CORONARY ANGIOGRAPHY (N/A) Subjective: No chest pain or shortness of breath. Only chronic back pain from prior surgery.  Objective: Vital signs in last 24 hours: Temp:  [97.6 F (36.4 C)-98.1 F (36.7 C)] 98.1 F (36.7 C) (01/13 1212) Pulse Rate:  [60-69] 63 (01/13 1212) Cardiac Rhythm: Atrial fibrillation (01/13 0700) Resp:  [16-19] 16 (01/13 1212) BP: (117-185)/(74-128) 185/128 (01/13 1212) SpO2:  [92 %-97 %] 92 % (01/13 0820) Weight:  [85 kg] 85 kg (01/13 0518)  Hemodynamic parameters for last 24 hours:    Intake/Output from previous day: 01/12 0701 - 01/13 0700 In: 1176.9 [P.O.:690; I.V.:486.9] Out: 2190 [Urine:2190] Intake/Output this shift: Total I/O In: 357 [P.O.:357] Out: 450 [Urine:450]  General appearance: alert and cooperative Heart: regular rate and rhythm Lungs: clear to auscultation bilaterally  Lab Results: Recent Labs    04/15/23 0219 04/16/23 0236  WBC 8.6 7.5  HGB 11.9* 12.7*  HCT 35.0* 38.5*  PLT 178 152   BMET:  Recent Labs    04/17/23 0301  NA 134*  K 4.1  CL 104  CO2 22  GLUCOSE 167*  BUN 28*  CREATININE 1.77*  CALCIUM  9.1    PT/INR: No results for input(s): LABPROT, INR in the last 72 hours. ABG    Component Value Date/Time   PHART 7.354 08/30/2022 1639   HCO3 21.1 08/30/2022 1639   TCO2 22 08/30/2022 1639   ACIDBASEDEF 4.0 (H) 08/30/2022 1639   O2SAT 100 08/30/2022 1639   CBG (last 3)  Recent Labs    04/16/23 2126 04/17/23 0520 04/17/23 1209  GLUCAP 131* 135* 120*    Assessment/Plan:  Stable for CABG and pulmonary vein isolation ablation, clipping of LAA tomorrow. Creat up slightly today to 1.77. Not on any diuretic.  LOS: 7 days    Dorise MARLA Fellers 04/17/2023

## 2023-04-17 NOTE — Progress Notes (Signed)
 Mobility Specialist Progress Note:    04/17/23 1607  Mobility  Activity Ambulated with assistance in hallway  Level of Assistance Contact guard assist, steadying assist  Assistive Device Other (Comment) (IV Pole)  Distance Ambulated (ft) 800 ft  Activity Response Tolerated well  Mobility Referral Yes  Mobility visit 1 Mobility  Mobility Specialist Start Time (ACUTE ONLY) 1430  Mobility Specialist Stop Time (ACUTE ONLY) 1445  Mobility Specialist Time Calculation (min) (ACUTE ONLY) 15 min   Received pt in bed having no complaints and agreeable to mobility. Pt was asymptomatic throughout ambulation and returned to room w/o fault. Left in bed w/ call bell in reach and all needs met.   Thersia Minder Mobility Specialist  Please contact vis Secure Chat or  Rehab Office [phone]

## 2023-04-17 NOTE — TOC Progression Note (Signed)
 Transition of Care [facility]) - Progression Note    Patient Details  Name: David [name] MRN: [id number] Date of Birth: Jan 31, 1961  Transition of [facility]) CM/SW Contact  Waddell Barnie Rama, RN Phone Number: 04/17/2023, 4:44 PM  Clinical Narrative:    Patient for CABG and ablation tomorrow.  TOC following.   Expected Discharge Plan: Home/Self Care Barriers to Discharge: Continued Medical Work up  Expected Discharge Plan and Services In-house Referral: NA Discharge Planning Services: CM Consult Post Acute Care Choice: NA Living arrangements for the past 2 months: Single Family Home                 DME Arranged: N/A DME Agency: NA       HH Arranged: NA           Social Determinants of Health (SDOH) Interventions SDOH Screenings   Food Insecurity: Food Insecurity Present (04/17/2023)  Housing: High Risk (04/17/2023)  Transportation Needs: Unmet Transportation Needs (04/17/2023)  Utilities: Not At Risk (04/10/2023)  Alcohol Screen: Low Risk  (03/01/2023)  Depression (PHQ2-9): High Risk (12/29/2022)  Financial Resource Strain: Medium Risk (03/01/2023)  Physical Activity: Insufficiently Active (03/01/2023)  Social Connections: Unknown (03/01/2023)  Stress: Stress Concern Present (03/01/2023)  Tobacco Use: Medium Risk (04/09/2023)    Readmission Risk Interventions    09/06/2021   12:07 PM  Readmission Risk Prevention Plan  Post Dischage Appt Complete  Medication Screening Complete  Transportation Screening Complete

## 2023-04-17 NOTE — Anesthesia Preprocedure Evaluation (Addendum)
 Anesthesia Evaluation  Patient identified by MRN, date of birth, ID band Patient awake    Reviewed: Allergy & Precautions, H&P , NPO status , Patient's Chart, lab work & pertinent test results  Airway Mallampati: II  TM Distance: >3 FB Neck ROM: Full    Dental no notable dental hx. (+) Teeth Intact, Dental Advisory Given   Pulmonary sleep apnea , former smoker   Pulmonary exam normal breath sounds clear to auscultation       Cardiovascular Exercise Tolerance: Good hypertension, Pt. on medications and Pt. on home beta blockers + angina  + CAD, + Past MI, + Peripheral Vascular Disease and +CHF   Rhythm:Regular Rate:Normal     Neuro/Psych   Anxiety     negative neurological ROS     GI/Hepatic negative GI ROS, Neg liver ROS,,,  Endo/Other  diabetes, Insulin  Dependent    Renal/GU Renal InsufficiencyRenal disease  negative genitourinary   Musculoskeletal  (+) Arthritis , Osteoarthritis,    Abdominal   Peds  Hematology  (+) Blood dyscrasia, anemia   Anesthesia Other Findings   Reproductive/Obstetrics negative OB ROS                             Anesthesia Physical Anesthesia Plan  ASA: 4  Anesthesia Plan: General   Post-op Pain Management: Tylenol  PO (pre-op)*   Induction: Intravenous  PONV Risk Score and Plan: 2 and Ondansetron  and Midazolam   Airway Management Planned: Oral ETT  Additional Equipment: Arterial line, CVP, PA Cath, TEE and Ultrasound Guidance Line Placement  Intra-op Plan:   Post-operative Plan: Post-operative intubation/ventilation  Informed Consent: I have reviewed the patients History and Physical, chart, labs and discussed the procedure including the risks, benefits and alternatives for the proposed anesthesia with the patient or authorized representative who has indicated his/her understanding and acceptance.     Dental advisory given  Plan Discussed with:  CRNA  Anesthesia Plan Comments:        Anesthesia Quick Evaluation

## 2023-04-18 ENCOUNTER — Inpatient Hospital Stay (HOSPITAL_COMMUNITY): Payer: Medicaid Other

## 2023-04-18 ENCOUNTER — Inpatient Hospital Stay (HOSPITAL_COMMUNITY)
Admission: EM | Disposition: A | Discharge status: Skilled Nursing Facility | Payer: Self-pay | Source: Home / Self Care | Attending: Surgery

## 2023-04-18 ENCOUNTER — Other Ambulatory Visit: Payer: Self-pay

## 2023-04-18 ENCOUNTER — Encounter (HOSPITAL_COMMUNITY): Payer: Self-pay | Admitting: Internal Medicine

## 2023-04-18 ENCOUNTER — Inpatient Hospital Stay (HOSPITAL_COMMUNITY): Payer: Self-pay | Admitting: Certified Registered Nurse Anesthetist

## 2023-04-18 ENCOUNTER — Inpatient Hospital Stay (HOSPITAL_COMMUNITY): Payer: Medicaid Other | Admitting: Certified Registered Nurse Anesthetist

## 2023-04-18 DIAGNOSIS — I110 Hypertensive heart disease with heart failure: Secondary | ICD-10-CM

## 2023-04-18 DIAGNOSIS — I4891 Unspecified atrial fibrillation: Secondary | ICD-10-CM | POA: Diagnosis not present

## 2023-04-18 DIAGNOSIS — I2510 Atherosclerotic heart disease of native coronary artery without angina pectoris: Secondary | ICD-10-CM

## 2023-04-18 DIAGNOSIS — I5033 Acute on chronic diastolic (congestive) heart failure: Secondary | ICD-10-CM | POA: Diagnosis not present

## 2023-04-18 DIAGNOSIS — Z87891 Personal history of nicotine dependence: Secondary | ICD-10-CM | POA: Diagnosis not present

## 2023-04-18 DIAGNOSIS — Z951 Presence of aortocoronary bypass graft: Secondary | ICD-10-CM

## 2023-04-18 HISTORY — PX: CLIPPING OF ATRIAL APPENDAGE: SHX5773

## 2023-04-18 HISTORY — PX: CORONARY ARTERY BYPASS GRAFT: SHX141

## 2023-04-18 HISTORY — PX: TEE WITHOUT CARDIOVERSION: SHX5443

## 2023-04-18 LAB — ECHO INTRAOPERATIVE TEE
AR max vel: 2.11 cm2
AV Area VTI: 1.75 cm2
AV Area mean vel: 1.86 cm2
AV Mean grad: 2 mm[Hg]
AV Peak grad: 4.5 mm[Hg]
Ao pk vel: 1.06 m/s
Height: 72 in
Weight: 2871.27 [oz_av]

## 2023-04-18 LAB — POCT I-STAT, CHEM 8
BUN: 26 mg/dL — ABNORMAL HIGH (ref 8–23)
BUN: 25 mg/dL — ABNORMAL HIGH (ref 8–23)
BUN: 22 mg/dL (ref 8–23)
BUN: 23 mg/dL (ref 8–23)
BUN: 25 mg/dL — ABNORMAL HIGH (ref 8–23)
Calcium, Ion: 1.31 mmol/L (ref 1.15–1.40)
Calcium, Ion: 1.3 mmol/L (ref 1.15–1.40)
Calcium, Ion: 1.18 mmol/L (ref 1.15–1.40)
Calcium, Ion: 1.21 mmol/L (ref 1.15–1.40)
Calcium, Ion: 1.2 mmol/L (ref 1.15–1.40)
Chloride: 105 mmol/L (ref 98–111)
Chloride: 107 mmol/L (ref 98–111)
Chloride: 105 mmol/L (ref 98–111)
Chloride: 106 mmol/L (ref 98–111)
Chloride: 105 mmol/L (ref 98–111)
Creatinine, Ser: 1.6 mg/dL — ABNORMAL HIGH (ref 0.61–1.24)
Creatinine, Ser: 1.5 mg/dL — ABNORMAL HIGH (ref 0.61–1.24)
Creatinine, Ser: 1.4 mg/dL — ABNORMAL HIGH (ref 0.61–1.24)
Creatinine, Ser: 1.5 mg/dL — ABNORMAL HIGH (ref 0.61–1.24)
Creatinine, Ser: 1.4 mg/dL — ABNORMAL HIGH (ref 0.61–1.24)
Glucose, Bld: 114 mg/dL — ABNORMAL HIGH (ref 70–99)
Glucose, Bld: 105 mg/dL — ABNORMAL HIGH (ref 70–99)
Glucose, Bld: 109 mg/dL — ABNORMAL HIGH (ref 70–99)
Glucose, Bld: 156 mg/dL — ABNORMAL HIGH (ref 70–99)
Glucose, Bld: 176 mg/dL — ABNORMAL HIGH (ref 70–99)
HCT: 34 % — ABNORMAL LOW (ref 39.0–52.0)
HCT: 34 % — ABNORMAL LOW (ref 39.0–52.0)
HCT: 26 % — ABNORMAL LOW (ref 39.0–52.0)
HCT: 27 % — ABNORMAL LOW (ref 39.0–52.0)
HCT: 26 % — ABNORMAL LOW (ref 39.0–52.0)
Hemoglobin: 11.6 g/dL — ABNORMAL LOW (ref 13.0–17.0)
Hemoglobin: 11.6 g/dL — ABNORMAL LOW (ref 13.0–17.0)
Hemoglobin: 8.8 g/dL — ABNORMAL LOW (ref 13.0–17.0)
Hemoglobin: 9.2 g/dL — ABNORMAL LOW (ref 13.0–17.0)
Hemoglobin: 8.8 g/dL — ABNORMAL LOW (ref 13.0–17.0)
Potassium: 4.5 mmol/L (ref 3.5–5.1)
Potassium: 4.6 mmol/L (ref 3.5–5.1)
Potassium: 4.8 mmol/L (ref 3.5–5.1)
Potassium: 5 mmol/L (ref 3.5–5.1)
Potassium: 5.1 mmol/L (ref 3.5–5.1)
Sodium: 138 mmol/L (ref 135–145)
Sodium: 136 mmol/L (ref 135–145)
Sodium: 138 mmol/L (ref 135–145)
Sodium: 138 mmol/L (ref 135–145)
Sodium: 137 mmol/L (ref 135–145)
TCO2: 23 mmol/L (ref 22–32)
TCO2: 22 mmol/L (ref 22–32)
TCO2: 21 mmol/L — ABNORMAL LOW (ref 22–32)
TCO2: 23 mmol/L (ref 22–32)
TCO2: 21 mmol/L — ABNORMAL LOW (ref 22–32)

## 2023-04-18 LAB — POCT I-STAT 7, (LYTES, BLD GAS, ICA,H+H)
Acid-base deficit: 5 mmol/L — ABNORMAL HIGH (ref 0.0–2.0)
Acid-base deficit: 5 mmol/L — ABNORMAL HIGH (ref 0.0–2.0)
Acid-base deficit: 4 mmol/L — ABNORMAL HIGH (ref 0.0–2.0)
Acid-base deficit: 5 mmol/L — ABNORMAL HIGH (ref 0.0–2.0)
Acid-base deficit: 6 mmol/L — ABNORMAL HIGH (ref 0.0–2.0)
Acid-base deficit: 3 mmol/L — ABNORMAL HIGH (ref 0.0–2.0)
Acid-base deficit: 3 mmol/L — ABNORMAL HIGH (ref 0.0–2.0)
Bicarbonate: 21.5 mmol/L (ref 20.0–28.0)
Bicarbonate: 20.3 mmol/L (ref 20.0–28.0)
Bicarbonate: 20.1 mmol/L (ref 20.0–28.0)
Bicarbonate: 21.3 mmol/L (ref 20.0–28.0)
Bicarbonate: 19.9 mmol/L — ABNORMAL LOW (ref 20.0–28.0)
Bicarbonate: 22.1 mmol/L (ref 20.0–28.0)
Bicarbonate: 22 mmol/L (ref 20.0–28.0)
Calcium, Ion: 1.3 mmol/L (ref 1.15–1.40)
Calcium, Ion: 1.12 mmol/L — ABNORMAL LOW (ref 1.15–1.40)
Calcium, Ion: 1.2 mmol/L (ref 1.15–1.40)
Calcium, Ion: 1.21 mmol/L (ref 1.15–1.40)
Calcium, Ion: 1.21 mmol/L (ref 1.15–1.40)
Calcium, Ion: 1.2 mmol/L (ref 1.15–1.40)
Calcium, Ion: 1.22 mmol/L (ref 1.15–1.40)
HCT: 37 % — ABNORMAL LOW (ref 39.0–52.0)
HCT: 25 % — ABNORMAL LOW (ref 39.0–52.0)
HCT: 24 % — ABNORMAL LOW (ref 39.0–52.0)
HCT: 28 % — ABNORMAL LOW (ref 39.0–52.0)
HCT: 33 % — ABNORMAL LOW (ref 39.0–52.0)
HCT: 31 % — ABNORMAL LOW (ref 39.0–52.0)
HCT: 32 % — ABNORMAL LOW (ref 39.0–52.0)
Hemoglobin: 12.6 g/dL — ABNORMAL LOW (ref 13.0–17.0)
Hemoglobin: 8.5 g/dL — ABNORMAL LOW (ref 13.0–17.0)
Hemoglobin: 8.2 g/dL — ABNORMAL LOW (ref 13.0–17.0)
Hemoglobin: 9.5 g/dL — ABNORMAL LOW (ref 13.0–17.0)
Hemoglobin: 11.2 g/dL — ABNORMAL LOW (ref 13.0–17.0)
Hemoglobin: 10.5 g/dL — ABNORMAL LOW (ref 13.0–17.0)
Hemoglobin: 10.9 g/dL — ABNORMAL LOW (ref 13.0–17.0)
O2 Saturation: 100 %
O2 Saturation: 100 %
O2 Saturation: 100 %
O2 Saturation: 100 %
O2 Saturation: 86 %
O2 Saturation: 90 %
O2 Saturation: 92 %
Patient temperature: 36.8
Patient temperature: 36.9
Patient temperature: 37.3
Potassium: 4.5 mmol/L (ref 3.5–5.1)
Potassium: 4.9 mmol/L (ref 3.5–5.1)
Potassium: 5 mmol/L (ref 3.5–5.1)
Potassium: 5.7 mmol/L — ABNORMAL HIGH (ref 3.5–5.1)
Potassium: 4.7 mmol/L (ref 3.5–5.1)
Potassium: 4.7 mmol/L (ref 3.5–5.1)
Potassium: 4.7 mmol/L (ref 3.5–5.1)
Sodium: 137 mmol/L (ref 135–145)
Sodium: 137 mmol/L (ref 135–145)
Sodium: 135 mmol/L (ref 135–145)
Sodium: 137 mmol/L (ref 135–145)
Sodium: 137 mmol/L (ref 135–145)
Sodium: 139 mmol/L (ref 135–145)
Sodium: 139 mmol/L (ref 135–145)
TCO2: 23 mmol/L (ref 22–32)
TCO2: 21 mmol/L — ABNORMAL LOW (ref 22–32)
TCO2: 21 mmol/L — ABNORMAL LOW (ref 22–32)
TCO2: 22 mmol/L (ref 22–32)
TCO2: 21 mmol/L — ABNORMAL LOW (ref 22–32)
TCO2: 23 mmol/L (ref 22–32)
TCO2: 23 mmol/L (ref 22–32)
pCO2 arterial: 42.4 mm[Hg] (ref 32–48)
pCO2 arterial: 37.9 mm[Hg] (ref 32–48)
pCO2 arterial: 36.8 mm[Hg] (ref 32–48)
pCO2 arterial: 39.6 mm[Hg] (ref 32–48)
pCO2 arterial: 38.8 mm[Hg] (ref 32–48)
pCO2 arterial: 37.3 mm[Hg] (ref 32–48)
pCO2 arterial: 38.9 mm[Hg] (ref 32–48)
pH, Arterial: 7.313 — ABNORMAL LOW (ref 7.35–7.45)
pH, Arterial: 7.337 — ABNORMAL LOW (ref 7.35–7.45)
pH, Arterial: 7.338 — ABNORMAL LOW (ref 7.35–7.45)
pH, Arterial: 7.346 — ABNORMAL LOW (ref 7.35–7.45)
pH, Arterial: 7.317 — ABNORMAL LOW (ref 7.35–7.45)
pH, Arterial: 7.38 (ref 7.35–7.45)
pH, Arterial: 7.362 (ref 7.35–7.45)
pO2, Arterial: 214 mm[Hg] — ABNORMAL HIGH (ref 83–108)
pO2, Arterial: 409 mm[Hg] — ABNORMAL HIGH (ref 83–108)
pO2, Arterial: 215 mm[Hg] — ABNORMAL HIGH (ref 83–108)
pO2, Arterial: 319 mm[Hg] — ABNORMAL HIGH (ref 83–108)
pO2, Arterial: 55 mm[Hg] — ABNORMAL LOW (ref 83–108)
pO2, Arterial: 60 mm[Hg] — ABNORMAL LOW (ref 83–108)
pO2, Arterial: 66 mm[Hg] — ABNORMAL LOW (ref 83–108)

## 2023-04-18 LAB — CBC
HCT: 31.6 % — ABNORMAL LOW (ref 39.0–52.0)
HCT: 33.2 % — ABNORMAL LOW (ref 39.0–52.0)
Hemoglobin: 11.1 g/dL — ABNORMAL LOW (ref 13.0–17.0)
Hemoglobin: 11.5 g/dL — ABNORMAL LOW (ref 13.0–17.0)
MCH: 32.7 pg (ref 26.0–34.0)
MCH: 32.9 pg (ref 26.0–34.0)
MCHC: 35.1 g/dL (ref 30.0–36.0)
MCHC: 34.6 g/dL (ref 30.0–36.0)
MCV: 93.2 fL (ref 80.0–100.0)
MCV: 94.9 fL (ref 80.0–100.0)
Platelets: 116 10*3/uL — ABNORMAL LOW (ref 150–400)
Platelets: 148 10*3/uL — ABNORMAL LOW (ref 150–400)
RBC: 3.39 MIL/uL — ABNORMAL LOW (ref 4.22–5.81)
RBC: 3.5 MIL/uL — ABNORMAL LOW (ref 4.22–5.81)
RDW: 13.2 % (ref 11.5–15.5)
RDW: 13.4 % (ref 11.5–15.5)
WBC: 13.2 10*3/uL — ABNORMAL HIGH (ref 4.0–10.5)
WBC: 17.4 10*3/uL — ABNORMAL HIGH (ref 4.0–10.5)
nRBC: 0 % (ref 0.0–0.2)
nRBC: 0 % (ref 0.0–0.2)

## 2023-04-18 LAB — PROTIME-INR
INR: 1.5 — ABNORMAL HIGH (ref 0.8–1.2)
INR: 1.5 — ABNORMAL HIGH (ref 0.8–1.2)
Prothrombin Time: 18.1 s — ABNORMAL HIGH (ref 11.4–15.2)
Prothrombin Time: 18.3 s — ABNORMAL HIGH (ref 11.4–15.2)

## 2023-04-18 LAB — POCT I-STAT EG7
Acid-base deficit: 5 mmol/L — ABNORMAL HIGH (ref 0.0–2.0)
Bicarbonate: 21.3 mmol/L (ref 20.0–28.0)
Calcium, Ion: 1.17 mmol/L (ref 1.15–1.40)
HCT: 27 % — ABNORMAL LOW (ref 39.0–52.0)
Hemoglobin: 9.2 g/dL — ABNORMAL LOW (ref 13.0–17.0)
O2 Saturation: 79 %
Potassium: 4.8 mmol/L (ref 3.5–5.1)
Sodium: 137 mmol/L (ref 135–145)
TCO2: 23 mmol/L (ref 22–32)
pCO2, Ven: 41.9 mm[Hg] — ABNORMAL LOW (ref 44–60)
pH, Ven: 7.315 (ref 7.25–7.43)
pO2, Ven: 47 mm[Hg] — ABNORMAL HIGH (ref 32–45)

## 2023-04-18 LAB — HEMOGLOBIN AND HEMATOCRIT, BLOOD
HCT: 24.4 % — ABNORMAL LOW (ref 39.0–52.0)
Hemoglobin: 8.3 g/dL — ABNORMAL LOW (ref 13.0–17.0)

## 2023-04-18 LAB — PLATELET COUNT: Platelets: 151 10*3/uL (ref 150–400)

## 2023-04-18 LAB — BASIC METABOLIC PANEL
Anion gap: 14 (ref 5–15)
Anion gap: 11 (ref 5–15)
BUN: 27 mg/dL — ABNORMAL HIGH (ref 8–23)
BUN: 24 mg/dL — ABNORMAL HIGH (ref 8–23)
CO2: 15 mmol/L — ABNORMAL LOW (ref 22–32)
CO2: 20 mmol/L — ABNORMAL LOW (ref 22–32)
Calcium: 9.3 mg/dL (ref 8.9–10.3)
Calcium: 8.5 mg/dL — ABNORMAL LOW (ref 8.9–10.3)
Chloride: 109 mmol/L (ref 98–111)
Chloride: 105 mmol/L (ref 98–111)
Creatinine, Ser: 1.62 mg/dL — ABNORMAL HIGH (ref 0.61–1.24)
Creatinine, Ser: 1.6 mg/dL — ABNORMAL HIGH (ref 0.61–1.24)
GFR, Estimated: 48 mL/min — ABNORMAL LOW (ref >60)
GFR, Estimated: 49 mL/min — ABNORMAL LOW (ref >60)
Glucose, Bld: 161 mg/dL — ABNORMAL HIGH (ref 70–99)
Glucose, Bld: 163 mg/dL — ABNORMAL HIGH (ref 70–99)
Potassium: 4.7 mmol/L (ref 3.5–5.1)
Potassium: 4.8 mmol/L (ref 3.5–5.1)
Sodium: 138 mmol/L (ref 135–145)
Sodium: 136 mmol/L (ref 135–145)

## 2023-04-18 LAB — GLUCOSE, CAPILLARY
Glucose-Capillary: 116 mg/dL — ABNORMAL HIGH (ref 70–99)
Glucose-Capillary: 137 mg/dL — ABNORMAL HIGH (ref 70–99)
Glucose-Capillary: 150 mg/dL — ABNORMAL HIGH (ref 70–99)
Glucose-Capillary: 132 mg/dL — ABNORMAL HIGH (ref 70–99)
Glucose-Capillary: 119 mg/dL — ABNORMAL HIGH (ref 70–99)
Glucose-Capillary: 128 mg/dL — ABNORMAL HIGH (ref 70–99)
Glucose-Capillary: 153 mg/dL — ABNORMAL HIGH (ref 70–99)
Glucose-Capillary: 168 mg/dL — ABNORMAL HIGH (ref 70–99)
Glucose-Capillary: 195 mg/dL — ABNORMAL HIGH (ref 70–99)
Glucose-Capillary: 164 mg/dL — ABNORMAL HIGH (ref 70–99)

## 2023-04-18 LAB — MAGNESIUM: Magnesium: 3 mg/dL — ABNORMAL HIGH (ref 1.7–2.4)

## 2023-04-18 LAB — HEPARIN LEVEL (UNFRACTIONATED): Heparin Unfractionated: 0.37 [IU]/mL (ref 0.30–0.70)

## 2023-04-18 LAB — APTT
aPTT: 38 s — ABNORMAL HIGH (ref 24–36)
aPTT: 37 s — ABNORMAL HIGH (ref 24–36)

## 2023-04-18 SURGERY — CORONARY ARTERY BYPASS GRAFTING (CABG)
Anesthesia: General | Site: Chest

## 2023-04-18 MED ORDER — PANTOPRAZOLE SODIUM 40 MG PO TBEC
40.0000 mg | DELAYED_RELEASE_TABLET | Freq: Every day | ORAL | Status: DC
  Administered 2023-04-20 – 2023-05-02 (×13): 40 mg via ORAL
  Filled 2023-04-18 (×13): qty 1

## 2023-04-18 MED ORDER — ONDANSETRON HCL 4 MG/2ML IJ SOLN
4.0000 mg | Freq: Four times a day (QID) | INTRAMUSCULAR | Status: DC | PRN
  Administered 2023-04-23 – 2023-04-24 (×2): 4 mg via INTRAVENOUS
  Filled 2023-04-18 (×2): qty 2

## 2023-04-18 MED ORDER — SODIUM CHLORIDE 0.9 % IV SOLN: INTRAVENOUS | Status: DC | PRN

## 2023-04-18 MED ORDER — PROPOFOL 10 MG/ML IV BOLUS
INTRAVENOUS | Status: DC | PRN
  Administered 2023-04-18: 40 mg via INTRAVENOUS
  Administered 2023-04-18: 50 mg via INTRAVENOUS

## 2023-04-18 MED ORDER — PREGABALIN 75 MG PO CAPS
75.0000 mg | ORAL_CAPSULE | Freq: Every day | ORAL | Status: DC
Start: 2023-04-19 — End: 2023-04-21
  Administered 2023-04-19 – 2023-04-20 (×2): 75 mg via ORAL
  Filled 2023-04-18 (×2): qty 1

## 2023-04-18 MED ORDER — LACTATED RINGERS IV SOLN
INTRAVENOUS | Status: AC
  Administered 2023-04-18: 20 mL/h via INTRAVENOUS

## 2023-04-18 MED ORDER — PROTAMINE SULFATE 10 MG/ML IV SOLN
INTRAVENOUS | Status: DC | PRN
  Administered 2023-04-18: 290 mg via INTRAVENOUS

## 2023-04-18 MED ORDER — TRAMADOL HCL 50 MG PO TABS
50.0000 mg | ORAL_TABLET | ORAL | Status: DC | PRN
Start: 2023-04-18 — End: 2023-04-20

## 2023-04-18 MED ORDER — FENTANYL CITRATE (PF) 250 MCG/5ML IJ SOLN
INTRAMUSCULAR | Status: AC
  Filled 2023-04-18: qty 5

## 2023-04-18 MED ORDER — MILRINONE LACTATE IN DEXTROSE 20-5 MG/100ML-% IV SOLN
0.1250 ug/kg/min | INTRAVENOUS | Status: DC
  Administered 2023-04-18: 0.125 ug/kg/min via INTRAVENOUS

## 2023-04-18 MED ORDER — MILRINONE LACTATE IN DEXTROSE 20-5 MG/100ML-% IV SOLN
INTRAVENOUS | Status: DC | PRN
  Administered 2023-04-18: .25 ug/kg/min via INTRAVENOUS

## 2023-04-18 MED ORDER — BISACODYL 5 MG PO TBEC
10.0000 mg | DELAYED_RELEASE_TABLET | Freq: Every day | ORAL | Status: DC
  Administered 2023-04-19 – 2023-04-27 (×9): 10 mg via ORAL
  Administered 2023-04-29: 5 mg via ORAL
  Administered 2023-04-30 – 2023-05-01 (×2): 10 mg via ORAL
  Filled 2023-04-18 (×12): qty 2

## 2023-04-18 MED ORDER — SODIUM CHLORIDE (PF) 0.9 % IJ SOLN
INTRAMUSCULAR | Status: AC
Start: 2023-04-18 — End: ?
  Filled 2023-04-18: qty 10

## 2023-04-18 MED ORDER — THROMBIN 20000 UNITS EX SOLR
OROMUCOSAL | Status: DC | PRN
  Administered 2023-04-18 (×4): 4 mL

## 2023-04-18 MED ORDER — EPINEPHRINE HCL 5 MG/250ML IV SOLN IN NS: 0.0000 ug/min | INTRAVENOUS | Status: DC

## 2023-04-18 MED ORDER — TEMAZEPAM 7.5 MG PO CAPS: 15.0000 mg | ORAL_CAPSULE | Freq: Once | ORAL | Status: DC | PRN

## 2023-04-18 MED ORDER — SODIUM CHLORIDE 0.9% FLUSH: 10.0000 mL | INTRAVENOUS | Status: DC | PRN

## 2023-04-18 MED ORDER — NOREPINEPHRINE 4 MG/250ML-% IV SOLN
0.0000 ug/min | INTRAVENOUS | Status: DC
  Administered 2023-04-18 – 2023-04-19 (×3): 2 ug/min via INTRAVENOUS
  Filled 2023-04-18 (×2): qty 250

## 2023-04-18 MED ORDER — ALBUMIN HUMAN 5 % IV SOLN
250.0000 mL | INTRAVENOUS | Status: DC | PRN
Start: 2023-04-18 — End: 2023-04-20
  Administered 2023-04-18 – 2023-04-19 (×2): 12.5 g via INTRAVENOUS

## 2023-04-18 MED ORDER — AMIODARONE LOAD VIA INFUSION
150.0000 mg | Freq: Once | INTRAVENOUS | Status: AC
  Administered 2023-04-18: 150 mg via INTRAVENOUS
  Filled 2023-04-18: qty 83.34

## 2023-04-18 MED ORDER — ROCURONIUM BROMIDE 10 MG/ML (PF) SYRINGE
PREFILLED_SYRINGE | INTRAVENOUS | Status: DC | PRN
  Administered 2023-04-18: 40 mg via INTRAVENOUS
  Administered 2023-04-18: 100 mg via INTRAVENOUS
  Administered 2023-04-18: 30 mg via INTRAVENOUS
  Administered 2023-04-18: 20 mg via INTRAVENOUS

## 2023-04-18 MED ORDER — PHENYLEPHRINE HCL (PRESSORS) 10 MG/ML IV SOLN
INTRAVENOUS | Status: DC | PRN
  Administered 2023-04-18: 40 ug via INTRAVENOUS
  Administered 2023-04-18: 240 ug via INTRAVENOUS
  Administered 2023-04-18 (×2): 40 ug via INTRAVENOUS

## 2023-04-18 MED ORDER — ASPIRIN 81 MG PO CHEW: 324.0000 mg | CHEWABLE_TABLET | Freq: Every day | ORAL | Status: DC

## 2023-04-18 MED ORDER — ROSUVASTATIN CALCIUM 20 MG PO TABS
20.0000 mg | ORAL_TABLET | Freq: Every day | ORAL | Status: DC
Start: 2023-04-19 — End: 2023-04-19
  Administered 2023-04-19: 20 mg via ORAL
  Filled 2023-04-18: qty 1

## 2023-04-18 MED ORDER — INSULIN REGULAR(HUMAN) IN NACL 100-0.9 UT/100ML-% IV SOLN
INTRAVENOUS | Status: DC
  Administered 2023-04-18: 2.6 [IU]/h via INTRAVENOUS

## 2023-04-18 MED ORDER — ALBUMIN HUMAN 5 % IV SOLN: INTRAVENOUS | Status: DC | PRN

## 2023-04-18 MED ORDER — EPHEDRINE SULFATE-NACL 50-0.9 MG/10ML-% IV SOSY
PREFILLED_SYRINGE | INTRAVENOUS | Status: DC | PRN
  Administered 2023-04-18: 2.5 mg via INTRAVENOUS

## 2023-04-18 MED ORDER — PLASMA-LYTE A IV SOLN: INTRAVENOUS | Status: DC | PRN

## 2023-04-18 MED ORDER — SODIUM CHLORIDE 0.9% FLUSH
10.0000 mL | Freq: Two times a day (BID) | INTRAVENOUS | Status: DC
  Administered 2023-04-18 – 2023-04-29 (×13): 10 mL

## 2023-04-18 MED ORDER — LIDOCAINE 2% (20 MG/ML) 5 ML SYRINGE
INTRAMUSCULAR | Status: AC
Start: 2023-04-18 — End: ?
  Filled 2023-04-18: qty 5

## 2023-04-18 MED ORDER — SODIUM CHLORIDE 0.9 % IV SOLN
INTRAVENOUS | Status: AC
  Administered 2023-04-18: 10 mL/h via INTRAVENOUS

## 2023-04-18 MED ORDER — ACETAMINOPHEN 160 MG/5ML PO SOLN
650.0000 mg | Freq: Once | ORAL | Status: AC
  Administered 2023-04-18: 650 mg
  Filled 2023-04-18: qty 20.3

## 2023-04-18 MED ORDER — ACETAMINOPHEN 500 MG PO TABS
1000.0000 mg | ORAL_TABLET | Freq: Four times a day (QID) | ORAL | Status: AC
  Administered 2023-04-18 – 2023-04-23 (×17): 1000 mg via ORAL
  Filled 2023-04-18 (×18): qty 2

## 2023-04-18 MED ORDER — OXYCODONE HCL 5 MG PO TABS
5.0000 mg | ORAL_TABLET | ORAL | Status: DC | PRN
Start: 2023-04-18 — End: 2023-04-22
  Administered 2023-04-18 – 2023-04-22 (×19): 10 mg via ORAL
  Filled 2023-04-18 (×19): qty 2

## 2023-04-18 MED ORDER — MIDAZOLAM HCL (PF) 5 MG/ML IJ SOLN
INTRAMUSCULAR | Status: DC | PRN
  Administered 2023-04-18: 2 mg via INTRAVENOUS
  Administered 2023-04-18: 3 mg via INTRAVENOUS

## 2023-04-18 MED ORDER — PANTOPRAZOLE SODIUM 40 MG IV SOLR
40.0000 mg | Freq: Every day | INTRAVENOUS | Status: AC
  Administered 2023-04-18 – 2023-04-19 (×2): 40 mg via INTRAVENOUS
  Filled 2023-04-18 (×2): qty 10

## 2023-04-18 MED ORDER — MIDAZOLAM HCL (PF) 10 MG/2ML IJ SOLN
INTRAMUSCULAR | Status: AC
  Filled 2023-04-18: qty 2

## 2023-04-18 MED ORDER — PROTAMINE SULFATE 10 MG/ML IV SOLN
INTRAVENOUS | Status: AC
  Filled 2023-04-18: qty 5

## 2023-04-18 MED ORDER — DEXTROSE 50 % IV SOLN: 0.0000 mL | INTRAVENOUS | Status: DC | PRN

## 2023-04-18 MED ORDER — METOPROLOL TARTRATE 12.5 MG HALF TABLET: 12.5000 mg | ORAL_TABLET | Freq: Two times a day (BID) | ORAL | Status: DC

## 2023-04-18 MED ORDER — METOPROLOL TARTRATE 25 MG/10 ML ORAL SUSPENSION: 12.5000 mg | Freq: Two times a day (BID) | ORAL | Status: DC

## 2023-04-18 MED ORDER — CHLORHEXIDINE GLUCONATE CLOTH 2 % EX PADS
6.0000 | MEDICATED_PAD | Freq: Every day | CUTANEOUS | Status: DC
  Administered 2023-04-18 – 2023-05-01 (×14): 6 via TOPICAL

## 2023-04-18 MED ORDER — LACTATED RINGERS IV SOLN: INTRAVENOUS | Status: DC | PRN

## 2023-04-18 MED ORDER — PHENYLEPHRINE HCL-NACL 20-0.9 MG/250ML-% IV SOLN
0.0000 ug/min | INTRAVENOUS | Status: DC
  Administered 2023-04-18: 20 ug/min via INTRAVENOUS
  Administered 2023-04-18: 40 ug/min via INTRAVENOUS
  Filled 2023-04-18: qty 250

## 2023-04-18 MED ORDER — POTASSIUM CHLORIDE 10 MEQ/50ML IV SOLN: 10.0000 meq | INTRAVENOUS | Status: AC

## 2023-04-18 MED ORDER — PROPOFOL 10 MG/ML IV BOLUS
INTRAVENOUS | Status: AC
Start: 2023-04-18 — End: ?
  Filled 2023-04-18: qty 20

## 2023-04-18 MED ORDER — METOCLOPRAMIDE HCL 5 MG/ML IJ SOLN
10.0000 mg | Freq: Four times a day (QID) | INTRAMUSCULAR | Status: AC
Start: 2023-04-18 — End: 2023-04-19
  Administered 2023-04-18 – 2023-04-19 (×5): 10 mg via INTRAVENOUS
  Filled 2023-04-18 (×5): qty 2

## 2023-04-18 MED ORDER — SODIUM CHLORIDE 0.9% FLUSH
3.0000 mL | Freq: Two times a day (BID) | INTRAVENOUS | Status: DC
  Administered 2023-04-19 – 2023-04-28 (×19): 3 mL via INTRAVENOUS

## 2023-04-18 MED ORDER — DOPAMINE-DEXTROSE 3.2-5 MG/ML-% IV SOLN
INTRAVENOUS | Status: DC | PRN
  Administered 2023-04-18: 2 ug/kg/min via INTRAVENOUS

## 2023-04-18 MED ORDER — ONDANSETRON HCL 4 MG/2ML IJ SOLN
INTRAMUSCULAR | Status: DC | PRN
  Administered 2023-04-18: 4 mg via INTRAVENOUS

## 2023-04-18 MED ORDER — ASPIRIN 325 MG PO TBEC
325.0000 mg | DELAYED_RELEASE_TABLET | Freq: Every day | ORAL | Status: DC
  Administered 2023-04-19 – 2023-04-23 (×5): 325 mg via ORAL
  Filled 2023-04-18 (×5): qty 1

## 2023-04-18 MED ORDER — DEXMEDETOMIDINE HCL IN NACL 400 MCG/100ML IV SOLN
0.0000 ug/kg/h | INTRAVENOUS | Status: DC
  Administered 2023-04-18: 0.1 ug/kg/h via INTRAVENOUS
  Administered 2023-04-18: 0.5 ug/kg/h via INTRAVENOUS
  Filled 2023-04-18: qty 100

## 2023-04-18 MED ORDER — ROCURONIUM BROMIDE 10 MG/ML (PF) SYRINGE
PREFILLED_SYRINGE | INTRAVENOUS | Status: AC
  Filled 2023-04-18: qty 10

## 2023-04-18 MED ORDER — THROMBIN (RECOMBINANT) 20000 UNITS EX SOLR
CUTANEOUS | Status: AC
  Filled 2023-04-18: qty 20000

## 2023-04-18 MED ORDER — MIDAZOLAM HCL 2 MG/2ML IJ SOLN: 2.0000 mg | INTRAMUSCULAR | Status: DC | PRN

## 2023-04-18 MED ORDER — FLUTICASONE PROPIONATE 50 MCG/ACT NA SUSP
2.0000 | Freq: Every day | NASAL | Status: DC
  Administered 2023-04-25 – 2023-05-01 (×5): 2 via NASAL
  Filled 2023-04-18: qty 16

## 2023-04-18 MED ORDER — DOCUSATE SODIUM 100 MG PO CAPS
200.0000 mg | ORAL_CAPSULE | Freq: Every day | ORAL | Status: DC
  Administered 2023-04-19 – 2023-04-23 (×5): 200 mg via ORAL
  Filled 2023-04-18 (×5): qty 2

## 2023-04-18 MED ORDER — SODIUM CHLORIDE 0.9% FLUSH
3.0000 mL | Freq: Two times a day (BID) | INTRAVENOUS | Status: DC
  Administered 2023-04-18 (×2): 10 mL via INTRAVENOUS
  Administered 2023-04-19: 5 mL via INTRAVENOUS
  Administered 2023-04-19: 10 mL via INTRAVENOUS
  Administered 2023-04-20: 3 mL via INTRAVENOUS
  Administered 2023-04-21: 10 mL via INTRAVENOUS
  Administered 2023-04-23: 3 mL via INTRAVENOUS
  Administered 2023-04-24: 10 mL via INTRAVENOUS
  Administered 2023-04-25 – 2023-04-28 (×3): 3 mL via INTRAVENOUS
  Administered 2023-04-30: 10 mL via INTRAVENOUS
  Administered 2023-04-30: 3 mL via INTRAVENOUS

## 2023-04-18 MED ORDER — HEPARIN SODIUM (PORCINE) 1000 UNIT/ML IJ SOLN
INTRAMUSCULAR | Status: AC
Start: 2023-04-18 — End: ?
  Filled 2023-04-18: qty 1

## 2023-04-18 MED ORDER — METOPROLOL TARTRATE 5 MG/5ML IV SOLN: 2.5000 mg | INTRAVENOUS | Status: DC | PRN

## 2023-04-18 MED ORDER — SODIUM CHLORIDE 0.45 % IV SOLN
INTRAVENOUS | Status: AC | PRN
Start: 2023-04-18 — End: 2023-04-19

## 2023-04-18 MED ORDER — SODIUM CHLORIDE 0.9 % IV SOLN: 250.0000 mL | INTRAVENOUS | Status: AC

## 2023-04-18 MED ORDER — CEFAZOLIN SODIUM-DEXTROSE 2-4 GM/100ML-% IV SOLN
2.0000 g | Freq: Three times a day (TID) | INTRAVENOUS | Status: AC
  Administered 2023-04-18 – 2023-04-20 (×6): 2 g via INTRAVENOUS
  Filled 2023-04-18 (×6): qty 100

## 2023-04-18 MED ORDER — SODIUM BICARBONATE 8.4 % IV SOLN
100.0000 meq | Freq: Once | INTRAVENOUS | Status: AC
  Administered 2023-04-18: 100 meq via INTRAVENOUS

## 2023-04-18 MED ORDER — SODIUM CHLORIDE 0.9% FLUSH: 3.0000 mL | INTRAVENOUS | Status: DC | PRN

## 2023-04-18 MED ORDER — HEPARIN SODIUM (PORCINE) 1000 UNIT/ML IJ SOLN
INTRAMUSCULAR | Status: DC | PRN
  Administered 2023-04-18: 29000 [IU] via INTRAVENOUS

## 2023-04-18 MED ORDER — AMIODARONE HCL IN DEXTROSE 360-4.14 MG/200ML-% IV SOLN
60.0000 mg/h | INTRAVENOUS | Status: AC
  Administered 2023-04-18 (×2): 60 mg/h via INTRAVENOUS
  Filled 2023-04-18 (×2): qty 200

## 2023-04-18 MED ORDER — MORPHINE SULFATE (PF) 2 MG/ML IV SOLN
1.0000 mg | INTRAVENOUS | Status: DC | PRN
Start: 2023-04-18 — End: 2023-04-21
  Administered 2023-04-18 (×3): 4 mg via INTRAVENOUS
  Administered 2023-04-18: 2 mg via INTRAVENOUS
  Administered 2023-04-19 – 2023-04-21 (×15): 4 mg via INTRAVENOUS
  Filled 2023-04-18 (×5): qty 2
  Filled 2023-04-18 (×2): qty 1
  Filled 2023-04-18 (×5): qty 2
  Filled 2023-04-18: qty 1
  Filled 2023-04-18 (×4): qty 2
  Filled 2023-04-18: qty 1
  Filled 2023-04-18 (×3): qty 2

## 2023-04-18 MED ORDER — 0.9 % SODIUM CHLORIDE (POUR BTL) OPTIME
TOPICAL | Status: DC | PRN
  Administered 2023-04-18: 5000 mL

## 2023-04-18 MED ORDER — PHENYLEPHRINE 80 MCG/ML (10ML) SYRINGE FOR IV PUSH (FOR BLOOD PRESSURE SUPPORT)
PREFILLED_SYRINGE | INTRAVENOUS | Status: AC
  Filled 2023-04-18: qty 10

## 2023-04-18 MED ORDER — ASPIRIN 81 MG PO CHEW
324.0000 mg | CHEWABLE_TABLET | Freq: Once | ORAL | Status: AC
  Administered 2023-04-18: 324 mg via ORAL
  Filled 2023-04-18: qty 4

## 2023-04-18 MED ORDER — FENTANYL CITRATE (PF) 250 MCG/5ML IJ SOLN
INTRAMUSCULAR | Status: DC | PRN
  Administered 2023-04-18 (×5): 50 ug via INTRAVENOUS
  Administered 2023-04-18: 100 ug via INTRAVENOUS
  Administered 2023-04-18: 200 ug via INTRAVENOUS
  Administered 2023-04-18: 100 ug via INTRAVENOUS
  Administered 2023-04-18: 50 ug via INTRAVENOUS

## 2023-04-18 MED ORDER — SODIUM CHLORIDE (PF) 0.9 % IJ SOLN
INTRAMUSCULAR | Status: AC
  Filled 2023-04-18: qty 10

## 2023-04-18 MED ORDER — MAGNESIUM SULFATE 4 GM/100ML IV SOLN
4.0000 g | Freq: Once | INTRAVENOUS | Status: AC
  Administered 2023-04-18: 4 g via INTRAVENOUS
  Filled 2023-04-18: qty 100

## 2023-04-18 MED ORDER — CHLORHEXIDINE GLUCONATE 0.12 % MT SOLN
15.0000 mL | OROMUCOSAL | Status: AC
  Administered 2023-04-18: 15 mL via OROMUCOSAL
  Filled 2023-04-18: qty 15

## 2023-04-18 MED ORDER — VANCOMYCIN HCL IN DEXTROSE 1-5 GM/200ML-% IV SOLN
1000.0000 mg | Freq: Once | INTRAVENOUS | Status: AC
  Administered 2023-04-18: 1000 mg via INTRAVENOUS
  Filled 2023-04-18: qty 200

## 2023-04-18 MED ORDER — ACETAMINOPHEN 160 MG/5ML PO SOLN: 1000.0000 mg | Freq: Four times a day (QID) | ORAL | Status: AC

## 2023-04-18 MED ORDER — AMIODARONE HCL IN DEXTROSE 360-4.14 MG/200ML-% IV SOLN
30.0000 mg/h | INTRAVENOUS | Status: DC
  Administered 2023-04-19 – 2023-04-20 (×3): 30 mg/h via INTRAVENOUS
  Filled 2023-04-18 (×3): qty 200

## 2023-04-18 MED ORDER — HEMOSTATIC AGENTS (NO CHARGE) OPTIME
TOPICAL | Status: DC | PRN
  Administered 2023-04-18: 1

## 2023-04-18 MED ORDER — BISACODYL 10 MG RE SUPP: 10.0000 mg | Freq: Every day | RECTAL | Status: DC

## 2023-04-18 SURGICAL SUPPLY — 103 items
ARTICLIP LAA PROCLIP II 35 (Clip) ×2 IMPLANT
BAG DECANTER FOR FLEXI CONT (MISCELLANEOUS) ×3 IMPLANT
BLADE CLIPPER SURG (BLADE) ×3 IMPLANT
BLADE STERNUM SYSTEM 6 (BLADE) ×3 IMPLANT
BNDG ELASTIC 4INX 5YD STR LF (GAUZE/BANDAGES/DRESSINGS) IMPLANT
BNDG ELASTIC 4X5.8 VLCR STR LF (GAUZE/BANDAGES/DRESSINGS) ×3 IMPLANT
BNDG ELASTIC 6INX 5YD STR LF (GAUZE/BANDAGES/DRESSINGS) IMPLANT
BNDG ELASTIC 6X5.8 VLCR STR LF (GAUZE/BANDAGES/DRESSINGS) ×3 IMPLANT
BNDG GAUZE DERMACEA FLUFF 4 (GAUZE/BANDAGES/DRESSINGS) ×3 IMPLANT
CANISTER SUCT 3000ML PPV (MISCELLANEOUS) ×3 IMPLANT
CANNULA ARTERIAL VENT 3/8 20FR (CANNULA) IMPLANT
CANNULA MC2 2 STG 36/46 CONN (CANNULA) IMPLANT
CATH ROBINSON RED A/P 18FR (CATHETERS) ×6 IMPLANT
CATH THORACIC 28FR (CATHETERS) ×3 IMPLANT
CATH THORACIC 36FR (CATHETERS) ×3 IMPLANT
CATH THORACIC 36FR RT ANG (CATHETERS) ×3 IMPLANT
CLAMP ISOLATOR ENCOMPASS RF (ELECTROSURGICAL) IMPLANT
CLIP TI MEDIUM 24 (CLIP) IMPLANT
CLIP TI WIDE RED SMALL 24 (CLIP) IMPLANT
CONTAINER PROTECT SURGISLUSH (MISCELLANEOUS) ×6 IMPLANT
DERMABOND ADVANCED .7 DNX12 (GAUZE/BANDAGES/DRESSINGS) IMPLANT
DEVICE ATRICLIP LAA PRCLPII 35 (Clip) IMPLANT
DRAPE SRG 135X102X78XABS (DRAPES) ×3 IMPLANT
DRAPE WARM FLUID 44X44 (DRAPES) ×3 IMPLANT
DRSG COVADERM 4X14 (GAUZE/BANDAGES/DRESSINGS) ×3 IMPLANT
ELECT BLADE 4.0 EZ CLEAN MEGAD (MISCELLANEOUS) ×2 IMPLANT
ELECT CAUTERY BLADE 6.4 (BLADE) ×3 IMPLANT
ELECT REM PT RETURN 9FT ADLT (ELECTROSURGICAL) ×4 IMPLANT
ELECTRODE BLDE 4.0 EZ CLN MEGD (MISCELLANEOUS) IMPLANT
ELECTRODE REM PT RTRN 9FT ADLT (ELECTROSURGICAL) ×6 IMPLANT
FELT TEFLON 1X6 (MISCELLANEOUS) ×3 IMPLANT
GAUZE 4X4 16PLY ~~LOC~~+RFID DBL (SPONGE) ×3 IMPLANT
GAUZE SPONGE 4X4 12PLY STRL (GAUZE/BANDAGES/DRESSINGS) ×6 IMPLANT
GAUZE SPONGE 4X4 12PLY STRL LF (GAUZE/BANDAGES/DRESSINGS) IMPLANT
GLOVE BIO SURGEON STRL SZ 6 (GLOVE) IMPLANT
GLOVE BIO SURGEON STRL SZ 6.5 (GLOVE) IMPLANT
GLOVE BIO SURGEON STRL SZ7 (GLOVE) IMPLANT
GLOVE BIO SURGEON STRL SZ7.5 (GLOVE) IMPLANT
GLOVE BIOGEL PI IND STRL 6 (GLOVE) IMPLANT
GLOVE BIOGEL PI IND STRL 6.5 (GLOVE) IMPLANT
GLOVE BIOGEL PI IND STRL 7.0 (GLOVE) IMPLANT
GLOVE ECLIPSE 7.0 STRL STRAW (GLOVE) ×6 IMPLANT
GLOVE ORTHO TXT STRL SZ7.5 (GLOVE) IMPLANT
GLOVE SS BIOGEL STRL SZ 6 (GLOVE) IMPLANT
GLOVE SURG SS PI 7.5 STRL IVOR (GLOVE) IMPLANT
GOWN STRL REUS W/ TWL LRG LVL3 (GOWN DISPOSABLE) ×12 IMPLANT
GOWN STRL REUS W/ TWL XL LVL3 (GOWN DISPOSABLE) ×3 IMPLANT
HEMOSTAT POWDER SURGIFOAM 1G (HEMOSTASIS) ×9 IMPLANT
HEMOSTAT SURGICEL 2X14 (HEMOSTASIS) ×3 IMPLANT
INSERT FOGARTY 61MM (MISCELLANEOUS) IMPLANT
INSERT FOGARTY XLG (MISCELLANEOUS) IMPLANT
KIT BASIN OR (CUSTOM PROCEDURE TRAY) ×3 IMPLANT
KIT SUCTION CATH 14FR (SUCTIONS) ×3 IMPLANT
KIT TURNOVER KIT B (KITS) ×3 IMPLANT
KIT VASOVIEW HEMOPRO 2 VH 4000 (KITS) ×3 IMPLANT
KNIFE MICRO-UNI 3.5 30 DEG (BLADE) ×3 IMPLANT
NS IRRIG 1000ML POUR BTL (IV SOLUTION) ×15 IMPLANT
PACK E OPEN HEART (SUTURE) ×3 IMPLANT
PACK OPEN HEART (CUSTOM PROCEDURE TRAY) ×3 IMPLANT
PAD ARMBOARD 7.5X6 YLW CONV (MISCELLANEOUS) ×6 IMPLANT
PAD ELECT DEFIB RADIOL ZOLL (MISCELLANEOUS) ×3 IMPLANT
PENCIL BUTTON HOLSTER BLD 10FT (ELECTRODE) ×3 IMPLANT
POSITIONER HEAD DONUT 9IN (MISCELLANEOUS) ×3 IMPLANT
PUNCH AORTIC ROTATE 4.0MM (MISCELLANEOUS) IMPLANT
PUNCH AORTIC ROTATE 4.5MM 8IN (MISCELLANEOUS) ×3 IMPLANT
PUNCH AORTIC ROTATE 5MM 8IN (MISCELLANEOUS) IMPLANT
SET MPS 3-ND DEL (MISCELLANEOUS) IMPLANT
SPONGE INTESTINAL PEANUT (DISPOSABLE) IMPLANT
SPONGE T-LAP 18X18 ~~LOC~~+RFID (SPONGE) ×12 IMPLANT
SPONGE T-LAP 4X18 ~~LOC~~+RFID (SPONGE) ×3 IMPLANT
SUPPORT HEART JANKE-BARRON (MISCELLANEOUS) ×3 IMPLANT
SUT BONE WAX W31G (SUTURE) ×3 IMPLANT
SUT MNCRL AB 4-0 PS2 18 (SUTURE) IMPLANT
SUT PROLENE 3 0 SH DA (SUTURE) IMPLANT
SUT PROLENE 3 0 SH1 36 (SUTURE) ×3 IMPLANT
SUT PROLENE 4 0 SH DA (SUTURE) IMPLANT
SUT PROLENE 4-0 RB1 .5 CRCL 36 (SUTURE) IMPLANT
SUT PROLENE 5 0 C 1 36 (SUTURE) IMPLANT
SUT PROLENE 6 0 C 1 30 (SUTURE) IMPLANT
SUT PROLENE 7 0 BV 1 (SUTURE) IMPLANT
SUT PROLENE 7 0 BV1 MDA (SUTURE) ×3 IMPLANT
SUT PROLENE 8 0 BV175 6 (SUTURE) IMPLANT
SUT SILK 1 MH (SUTURE) IMPLANT
SUT SILK 2 0 SH (SUTURE) IMPLANT
SUT STEEL 6MS V (SUTURE) IMPLANT
SUT STEEL STERNAL CCS#1 18IN (SUTURE) IMPLANT
SUT STEEL SZ 6 DBL 3X14 BALL (SUTURE) IMPLANT
SUT VIC AB 1 CTX36XBRD ANBCTR (SUTURE) ×6 IMPLANT
SUT VIC AB 2-0 CT1 TAPERPNT 27 (SUTURE) IMPLANT
SUT VIC AB 2-0 CTX 27 (SUTURE) IMPLANT
SUT VIC AB 3-0 SH 27X BRD (SUTURE) IMPLANT
SUT VIC AB 3-0 X1 27 (SUTURE) IMPLANT
SUT VICRYL 4-0 PS2 18IN ABS (SUTURE) IMPLANT
SYSTEM SAHARA CHEST DRAIN ATS (WOUND CARE) ×3 IMPLANT
TAPE CLOTH SURG 4X10 WHT LF (GAUZE/BANDAGES/DRESSINGS) IMPLANT
TAPE PAPER 2X10 WHT MICROPORE (GAUZE/BANDAGES/DRESSINGS) IMPLANT
TOWEL GREEN STERILE (TOWEL DISPOSABLE) ×3 IMPLANT
TOWEL GREEN STERILE FF (TOWEL DISPOSABLE) ×3 IMPLANT
TRAY FOLEY SLVR 16FR TEMP STAT (SET/KITS/TRAYS/PACK) ×3 IMPLANT
TUBING LAP HI FLOW INSUFFLATIO (TUBING) ×3 IMPLANT
UNDERPAD 30X36 HEAVY ABSORB (UNDERPADS AND DIAPERS) ×3 IMPLANT
WATER STERILE IRR 1000ML POUR (IV SOLUTION) ×6 IMPLANT
YANKAUER SUCT BULB TIP NO VENT (SUCTIONS) IMPLANT

## 2023-04-18 NOTE — Interval H&P Note (Signed)
 History and Physical Interval Note:  04/18/2023 6:28 AM  David [name]  has presented today for surgery, with the diagnosis of CAD.  The various methods of treatment have been discussed with the patient and family. After consideration of risks, benefits and other options for treatment, the patient has consented to  Procedure(s): CORONARY ARTERY BYPASS GRAFTING (CABG) (N/A) CLIPPING OF ATRIAL APPENDAGE with pulmonary vein isolation ablation (N/A) TRANSESOPHAGEAL ECHOCARDIOGRAM (TEE) (N/A) as a surgical intervention.  The patient's history has been reviewed, patient examined, no change in status, stable for surgery.  I have reviewed the patient's chart and labs.  Questions were answered to the patient's satisfaction.     Clover Feehan K Duwan Adrian

## 2023-04-18 NOTE — Anesthesia Procedure Notes (Signed)
 Central Venous Catheter Insertion Performed by: Epifanio Fallow, MD, anesthesiologist Start/End1/14/2025 6:50 AM, 04/18/2023 7:10 AM Patient location: Pre-op. Preanesthetic checklist: patient identified, IV checked, site marked, risks and benefits discussed, surgical consent, monitors and equipment checked, pre-op evaluation, timeout performed and anesthesia consent Position: Trendelenburg Lidocaine  1% used for infiltration and patient sedated Hand hygiene performed , maximum sterile barriers used  and Seldinger technique used Catheter size: 9 Fr Total catheter length 10. Central line was placed.MAC introducer Procedure performed using ultrasound guided technique. Ultrasound Notes:anatomy identified, needle tip was noted to be adjacent to the nerve/plexus identified, no ultrasound evidence of intravascular and/or intraneural injection and image(s) printed for medical record Attempts: 1 Following insertion, line sutured, dressing applied and Biopatch. Post procedure assessment: blood return through all ports, free fluid flow and no air  Patient tolerated the procedure well with no immediate complications.

## 2023-04-18 NOTE — Procedures (Signed)
 Extubation Procedure Note  Patient Details:   Name: David [name] DOB: [birth year] MRN: [id number]   Airway Documentation:    Vent end date: 04/18/23 Vent end time: 1535   Evaluation  O2 sats: stable throughout Complications: No apparent complications Patient did tolerate procedure well. Bilateral Breath Sounds: Clear, Diminished   Yes Pt was successfully extubated with no apparent complications. NIF -40 and VC 1.4L. Audible cuffleak heard prior extubation/no signs of stridor at this time.   [name] Mater 04/18/2023, 3:41 PM

## 2023-04-18 NOTE — Progress Notes (Signed)
 Pt O2 sats 88 on 50% & 5 of peep. 100% Fio2 breath given with no improvement. Recruitment maneuver performed via ventilator with peep of 15 with no hemodynamic changes.

## 2023-04-18 NOTE — Anesthesia Procedure Notes (Signed)
 Arterial Line Insertion Start/End1/14/2025 7:00 AM Performed by: Lamar Lucie DASEN, CRNA, CRNA  Patient location: Pre-op. Preanesthetic checklist: patient identified, IV checked, site marked, risks and benefits discussed, surgical consent, monitors and equipment checked, pre-op evaluation, timeout performed and anesthesia consent Lidocaine  1% used for infiltration Left, radial was placed Catheter size: 20 G Hand hygiene performed  and maximum sterile barriers used   Attempts: 1 Procedure performed using ultrasound guided technique. Following insertion, dressing applied and Biopatch. Post procedure assessment: normal and unchanged  Patient tolerated the procedure well with no immediate complications.

## 2023-04-18 NOTE — Anesthesia Postprocedure Evaluation (Signed)
 Anesthesia Post Note  Patient: PIKE SCANTLEBURY  Procedure(s) Performed: CORONARY ARTERY BYPASS GRAFTING (CABG) TIMES FOUR USING LEFT INTERNAL MAMMARY ARTERY AND ENDOSCOPICALLY HARVESTED LEFT GREATER SAPHENOUS VEIN (Chest) CLIPPING OF ATRIAL APPENDAGE USING ATRICURE ATRICLIP [id number] with pulmonary vein isolation ablation TRANSESOPHAGEAL ECHOCARDIOGRAM (TEE)     Patient location during evaluation: SICU Anesthesia Type: General Level of consciousness: sedated Pain management: pain level controlled Vital Signs Assessment: post-procedure vital signs reviewed and stable Respiratory status: patient remains intubated per anesthesia plan Cardiovascular status: stable Postop Assessment: no apparent nausea or vomiting Anesthetic complications: no  No notable events documented.  Last Vitals:  Vitals:   04/18/23 1545 04/18/23 1600  BP:  (!) 86/66  Pulse: (!) 112 100  Resp: 20 18  Temp: 37 C 37.1 C  SpO2:  93%    Last Pain:  Vitals:   04/18/23 1628  TempSrc:   PainSc: 9                  Jamice Carreno,W. EDMOND

## 2023-04-18 NOTE — Anesthesia Procedure Notes (Signed)
 Central Venous Catheter Insertion Performed by: Epifanio Fallow, MD, anesthesiologist Start/End1/14/2025 6:50 AM, 04/18/2023 7:10 AM Patient location: Pre-op. Preanesthetic checklist: patient identified, IV checked, site marked, risks and benefits discussed, surgical consent, monitors and equipment checked, pre-op evaluation, timeout performed and anesthesia consent Hand hygiene performed  and maximum sterile barriers used  PA cath was placed.Swan type:thermodilution PA Cath depth:50 Procedure performed without using ultrasound guided technique. Attempts: 1 Patient tolerated the procedure well with no immediate complications.

## 2023-04-18 NOTE — Plan of Care (Signed)
  Problem: Education: Goal: Knowledge of General Education information will improve Description: Including pain rating scale, medication(s)/side effects and non-pharmacologic comfort measures Outcome: Progressing   Problem: Clinical Measurements: Goal: Will remain free from infection Outcome: Progressing Goal: Diagnostic test results will improve Outcome: Progressing Goal: Respiratory complications will improve Outcome: Progressing Goal: Cardiovascular complication will be avoided Outcome: Progressing   Problem: Coping: Goal: Ability to adjust to condition or change in health will improve Outcome: Progressing   Problem: Fluid Volume: Goal: Ability to maintain a balanced intake and output will improve Outcome: Progressing   Problem: Pain Management: Goal: General experience of comfort will improve Outcome: Not Progressing

## 2023-04-18 NOTE — Progress Notes (Signed)
 EVENING ROUNDS NOTE :     [address]       [place] [address]             [phone]                 * Day of Surgery * Procedure(s) (LRB): CORONARY ARTERY BYPASS GRAFTING (CABG) TIMES FOUR USING LEFT INTERNAL MAMMARY ARTERY AND ENDOSCOPICALLY HARVESTED LEFT GREATER SAPHENOUS VEIN (N/A) CLIPPING OF ATRIAL APPENDAGE USING ATRICURE ATRICLIP [id number] with pulmonary vein isolation ablation (N/A) TRANSESOPHAGEAL ECHOCARDIOGRAM (TEE) (N/A)   Total Length of Stay:  LOS: 8 days  Events:   Extubated Low CT output Starting levo    BP 107/82   Pulse (!) 122   Temp 99.5 F (37.5 C)   Resp (!) 22   Ht 6' (1.829 m)   Wt 81.4 kg   SpO2 (!) 87%   BMI 24.34 kg/m   PAP: (32-66)/(19-39) 56/33 CVP:  [11 mmHg-31 mmHg] 23 mmHg CO:  [3.9 L/min-5.3 L/min] 4 L/min CI:  [1.93 L/min/m2-2.6 L/min/m2] 2 L/min/m2  Vent Mode: PSV;CPAP FiO2 (%):  [40 %-100 %] 40 % Set Rate:  [4 bmp-16 bmp] 4 bmp Vt Set:  [620 mL] 620 mL PEEP:  [5 cmH20] 5 cmH20 Pressure Support:  [10 cmH20] 10 cmH20 Plateau Pressure:  [18 cmH20] 18 cmH20   sodium chloride      [START ON 04/19/2023] sodium chloride      sodium chloride  10 mL/hr at 04/18/23 1800   albumin  human Stopped (04/18/23 1416)   amiodarone  60 mg/hr (04/18/23 1800)   Followed by   NOREEN ON 04/19/2023] amiodarone       ceFAZolin  (ANCEF ) IV Stopped (04/18/23 1502)   dexmedetomidine  (PRECEDEX ) IV infusion 0.1 mcg/kg/hr (04/18/23 1807)   insulin  1.4 Units/hr (04/18/23 1800)   lactated ringers  20 mL/hr at 04/18/23 1800   magnesium  sulfate 20 mL/hr at 04/18/23 1800   milrinone  0.125 mcg/kg/min (04/18/23 1800)   norepinephrine  (LEVOPHED ) Adult infusion 2 mcg/min (04/18/23 1826)   phenylephrine  (NEO-SYNEPHRINE) Adult infusion 50 mcg/min (04/18/23 1800)   vancomycin       I/O last 3 completed shifts: In: 1295.5 [P.O.:687; I.V.:608.5] Out: 2665 [Urine:2665]      Latest Ref Rng & Units 04/18/2023    4:34 PM 04/18/2023    3:19 PM  04/18/2023    2:08 PM  CBC  WBC 4.0 - 10.5 K/uL   13.2   Hemoglobin 13.0 - 17.0 g/dL 89.0  89.4  88.8   Hematocrit 39.0 - 52.0 % 32.0  31.0  31.6   Platelets 150 - 400 K/uL   116        Latest Ref Rng & Units 04/18/2023    4:34 PM 04/18/2023    3:19 PM 04/18/2023    1:34 PM  BMP  Sodium 135 - 145 mmol/L 139  139  137   Potassium 3.5 - 5.1 mmol/L 4.7  4.7  4.7     ABG    Component Value Date/Time   PHART 7.362 04/18/2023 1634   PCO2ART 38.9 04/18/2023 1634   PO2ART 66 (L) 04/18/2023 1634   HCO3 22.0 04/18/2023 1634   TCO2 23 04/18/2023 1634   ACIDBASEDEF 3.0 (H) 04/18/2023 1634   O2SAT 92 04/18/2023 1634       Laurinda Carreno, MD 04/18/2023 6:38 PM

## 2023-04-18 NOTE — Op Note (Signed)
 CARDIOVASCULAR SURGERY OPERATIVE NOTE  04/18/2023  Surgeon:  Dorise LOIS Fellers, MD  First Assistant: Laurel Becket, PA-C:   An experienced assistant was required given the complexity of this surgery and the standard of surgical care. The assistant was needed for endoscopic vein harvest, exposure, dissection, suctioning, retraction of delicate tissues and sutures, instrument exchange and for overall help during this procedure.   Preoperative Diagnosis:  Severe multi-vessel coronary artery disease   Postoperative Diagnosis:  Same   Procedure:  Median Sternotomy Extracorporeal circulation 3.   Coronary artery bypass grafting x 4  Left internal mammary artery graft to the LAD SVG to PDA Sequential SVG to OM1 and OM3  4.   Endoscopic vein harvest from the left leg 5.   Pulmonary vein isolation ablation using Encompass clamp 6.   Clipping of left atrial appendage  Anesthesia:  General Endotracheal   Clinical History/Surgical Indication:  He presented with worsening shortness of breath and probably having some chest discomfort but vague about that. He ruled in for NSTEMI and echo shows normal LVEF with mild LVH, no significant valvular abnormality. Cath shows diffusely calcified coronary arteries with severe 3V disease. I agree that CABG is the best treatment for him but certainly not straightforward. Hopefully his distal PDA will be graftable. He has high grade stenoses in the distal large OM but these sub-branches are likely intramyocardial and may not be accessible. His LAD looks graftable but has 50% distal stenosis. His operative risk is increased due to poorly controlled DM with Hgb A1c of 7.4, stage 3b CKD with creat up to 2.28 this admission, now 1.65. He was 1.28 in October. He has had severe MRSA infection with toe amputations, major spine surgery for discitis and osteomyelitis in May  2024 on chronic doxycycline  for suppression. He has been on Eliquis  for permanent AF with last dose on Sunday. Plan CABG, pulmonary vein isolation ablation and LAA clip. I discussed the operative procedure with the patient  including alternatives, benefits and risks; including but not limited to bleeding, blood transfusion, infection, stroke, myocardial infarction, graft failure, heart block requiring a permanent pacemaker, organ dysfunction, and death.  David [name] understands and agrees to proceed.      Preparation:  The patient was seen in the preoperative holding area and the correct patient, correct operation were confirmed with the patient after reviewing the medical record and catheterization. The consent was signed by me. Preoperative antibiotics were given. A pulmonary arterial line and radial arterial line were placed by the anesthesia team. PA pressures were severely elevated 70's/30's. The patient was taken back to the operating room and positioned supine on the operating room table. After being placed under general endotracheal anesthesia by the anesthesia team a foley catheter was placed. The neck, chest, abdomen, and both legs were prepped with betadine  soap and solution and draped in the usual sterile manner. A surgical time-out was taken and the correct patient and operative procedure were confirmed with the nursing and anesthesia staff.   Cardiopulmonary Bypass:  A median sternotomy was performed. The pericardium was opened in the midline. Right ventricular function appeared normal. The ascending aorta was of normal size and had no palpable plaque. There were no contraindications to aortic cannulation or cross-clamping. The patient was fully systemically heparinized and the ACT was maintained > 400 sec. The proximal aortic arch was cannulated with a 20 F aortic cannula for arterial inflow. Venous cannulation was performed via the right atrial appendage using a two-staged venous cannula.  An antegrade cardioplegia/vent cannula was inserted into the mid-ascending aorta. Aortic occlusion was performed with a single cross-clamp. Systemic cooling to 32 degrees Centigrade and topical cooling of the heart with iced saline were used. Hyperkalemic antegrade cold blood cardioplegia was used to induce diastolic arrest and was then given at about 20 minute intervals throughout the period of arrest to maintain myocardial temperature at or below 10 degrees centigrade. A temperature probe was inserted into the interventricular septum and an insulating pad was placed in the pericardium.   Left internal mammary artery harvest:  The left side of the sternum was retracted using the Rultract retractor. The left internal mammary artery was harvested as a pedicle graft. All side branches were clipped. It was a medium-sized vessel of good quality with excellent blood flow. It was ligated distally and divided. It was sprayed with topical papaverine solution to prevent vasospasm.   Endoscopic vein harvest: Performed by Laurel Becket, PA-C  The left greater saphenous vein was harvested endoscopically through a 2 cm incision medial to the left knee. It was harvested from the upper thigh to below the knee. It was a medium-sized vein of good quality. The side branches were all ligated with 4-0 silk ties.   Pulmonary vein isolation ablation:  After stopping the heart the SVC was mobilized from the right pulmonary artery circumferentially. The rubber catheter with magnetic ends was passed beneath the IVC and through the transverse sinus superiorly and through the oblique sinus inferiorly. The Encompass clamp was passed left to right. It was closed taking care not to pinch the LAA or right atrium. 3 firings at each of three applications was performed. The clamp was removed.   Ligation of left atrial appendage:   The base of the appendage was measured and a 35 mm Atricure Atriclip Pro 2 was chosen. This was  placed across the base of the LAA without difficulty.     Coronary arteries:  The coronary arteries were examined.  LAD:  diffusely diseased and the only graftable spot was distally near the apex. LCX:  3 OM branches. The first and third were graftable but the second was diffusely diseased and not graftable. RCA:  Diffusely diseased. The distal PDA was graftable.   Grafts: Performed by me and assisted by Laurel Becket, PA-C  LIMA to the LAD: 1.75 mm. It was sewn end to side using 8-0 prolene continuous suture. SVG to PDA:  1.6 mm. It was sewn end to side using 7-0 prolene continuous suture. Sequential SVG to OM1:  1.75 mm. It was sewn sequential side to side using 7-0 prolene continuous suture. Sequential SVG to OM3:  1.6 mm. It was sewn sequential end to side using 7-0 prolene continuous suture.  The proximal vein graft anastomoses were performed to the mid-ascending aorta using continuous 6-0 prolene suture. Graft markers were placed around the proximal anastomoses.   Completion:  The patient was rewarmed to 37 degrees Centigrade. The clamp was removed from the LIMA pedicle and there was rapid warming of the septum and return of ventricular fibrillation. The crossclamp was removed with a time of 97 minutes. There was spontaneous return of sinus rhythm. The distal and proximal anastomoses were checked for hemostasis. The position of the grafts was satisfactory. Two temporary epicardial pacing wires were placed on the right atrium and two on the right ventricle. The patient was weaned from CPB without difficulty on milrinone  0.25, epi 2, dop 2 for renal perfusion. CPB time was 117 minutes. TEE showed  normal LV systolic function, improved RV systolic function and mild MR. Cardiac output was 5 LPM. Heparin  was fully reversed with protamine  and the aortic and venous cannulas removed. Hemostasis was achieved. Mediastinal and left pleural drainage tubes were placed. The sternum was closed with  #6 stainless steel wires. The fascia was closed with continuous # 1 vicryl suture. The subcutaneous tissue was closed with 2-0 vicryl continuous suture. The skin was closed with 3-0 vicryl subcuticular suture. All sponge, needle, and instrument counts were reported correct at the end of the case. Dry sterile dressings were placed over the incisions and around the chest tubes which were connected to pleurevac suction. The patient was then transported to the surgical intensive care unit in stable condition.

## 2023-04-18 NOTE — Brief Op Note (Signed)
 04/09/2023 - 04/18/2023  11:45 AM  PATIENT:  David [name]  63 y.o. male  PRE-OPERATIVE DIAGNOSIS:  Coronary Artery Disease, NSTEMI, Atrial Fibrillation  POST-OPERATIVE DIAGNOSIS:  Coronary Artery Disease, NSTEMI, Atrial Fibrillation  PROCEDURE:   CORONARY ARTERY BYPASS GRAFTING (CABG) TIMES FOUR USING LEFT INTERNAL MAMMARY ARTERY AND ENDOSCOPICALLY HARVESTED LEFT GREATER SAPHENOUS VEIN   Vein harvest time: Vein prep time:  LIMA-LAD SVG-OM1-OM2 (sequential) SVG-PDA  CLIPPING OF ATRIAL APPENDAGE USING ATRICURE ATRICLIP [id number]   PULMONARY VEIN ISOLATION  TRANSESOPHAGEAL ECHOCARDIOGRAM   SURGEON:  Lucas Dorise POUR, MD  PHYSICIAN ASSISTANT:  Kenyonna Micek  ASSISTANTS: Dyane Ileana DEL, RN, Scrub Person      Gershon Larve, RN, Scrub Person   ANESTHESIA:   general  EBL:165ml  BLOOD ADMINISTERED:none  DRAINS:  Left pleural and mediastinal drains    LOCAL MEDICATIONS USED:  NONE  SPECIMEN:  No Specimen  DISPOSITION OF SPECIMEN:  N/A  COUNTS:  Correct  DICTATION: .Dragon Dictation  PLAN OF CARE: Admit to inpatient   PATIENT DISPOSITION:  ICU - intubated and hemodynamically stable.   Delay start of Pharmacological VTE agent (>24hrs) due to surgical blood loss or risk of bleeding: yes

## 2023-04-18 NOTE — Anesthesia Procedure Notes (Addendum)
 Procedure Name: MAC Date/Time: 04/18/2023 7:41 AM  Performed by: Lamar Lucie DASEN, CRNAPre-anesthesia Checklist: Patient identified, Emergency Drugs available, Suction available and Patient being monitored Patient Re-evaluated:Patient Re-evaluated prior to induction Oxygen Delivery Method: Circle system utilized Preoxygenation: Pre-oxygenation with 100% oxygen Induction Type: IV induction Ventilation: Two handed mask ventilation required and Oral airway inserted - appropriate to patient size Laryngoscope Size: Mac and 4 Grade View: Grade II Tube size: 8.0 mm Number of attempts: 1 Airway Equipment and Method: Stylet Placement Confirmation: ETT inserted through vocal cords under direct vision, positive ETCO2 and CO2 detector Secured at: 22 cm Tube secured with: Tape

## 2023-04-18 NOTE — Transfer of Care (Signed)
 Immediate Anesthesia Transfer of Care Note  Patient: David [name]  Procedure(s) Performed: CORONARY ARTERY BYPASS GRAFTING (CABG) TIMES FOUR USING LEFT INTERNAL MAMMARY ARTERY AND ENDOSCOPICALLY HARVESTED LEFT GREATER SAPHENOUS VEIN (Chest) CLIPPING OF ATRIAL APPENDAGE USING ATRICURE ATRICLIP [id number] with pulmonary vein isolation ablation TRANSESOPHAGEAL ECHOCARDIOGRAM (TEE)  Patient Location: ICU  Anesthesia Type:General  Level of Consciousness: sedated and Patient remains intubated per anesthesia plan  Airway & Oxygen Therapy: Patient remains intubated per anesthesia plan and Patient placed on Ventilator (see vital sign flow sheet for setting)  Post-op Assessment: Report given to RN and Post -op Vital signs reviewed and stable  Post vital signs: Reviewed and stable  Last Vitals:  Vitals Value Taken Time  BP    Temp 36.8 C 04/18/23 1325  Pulse 89 04/18/23 1325  Resp 23 04/18/23 1325  SpO2 92 % 04/18/23 1325  Vitals shown include unfiled device data.  Last Pain:  Vitals:   04/18/23 0454  TempSrc: Oral  PainSc:       Patients Stated Pain Goal: 2 (04/16/23 2120)  Complications: No notable events documented.  Patient transported to ICU with standard monitors (HR, BP, SPO2, RR) and emergency drugs/equipment. Controlled ventilation maintained via ambu bag. Report given to bedside RN and respiratory therapist. Pt connected to ICU monitor and ventilator. All questions answered and vital signs stable before leaving

## 2023-04-19 ENCOUNTER — Inpatient Hospital Stay (HOSPITAL_COMMUNITY): Payer: Medicaid Other

## 2023-04-19 ENCOUNTER — Encounter (HOSPITAL_COMMUNITY): Payer: Self-pay | Admitting: Surgery

## 2023-04-19 DIAGNOSIS — I214 Non-ST elevation (NSTEMI) myocardial infarction: Secondary | ICD-10-CM | POA: Diagnosis not present

## 2023-04-19 LAB — BASIC METABOLIC PANEL
Anion gap: 9 (ref 5–15)
Anion gap: 10 (ref 5–15)
BUN: 24 mg/dL — ABNORMAL HIGH (ref 8–23)
BUN: 31 mg/dL — ABNORMAL HIGH (ref 8–23)
CO2: 21 mmol/L — ABNORMAL LOW (ref 22–32)
CO2: 18 mmol/L — ABNORMAL LOW (ref 22–32)
Calcium: 8.4 mg/dL — ABNORMAL LOW (ref 8.9–10.3)
Calcium: 8.2 mg/dL — ABNORMAL LOW (ref 8.9–10.3)
Chloride: 104 mmol/L (ref 98–111)
Chloride: 99 mmol/L (ref 98–111)
Creatinine, Ser: 1.73 mg/dL — ABNORMAL HIGH (ref 0.61–1.24)
Creatinine, Ser: 2.24 mg/dL — ABNORMAL HIGH (ref 0.61–1.24)
GFR, Estimated: 44 mL/min — ABNORMAL LOW (ref >60)
GFR, Estimated: 33 mL/min — ABNORMAL LOW (ref >60)
Glucose, Bld: 95 mg/dL (ref 70–99)
Glucose, Bld: 255 mg/dL — ABNORMAL HIGH (ref 70–99)
Potassium: 4.4 mmol/L (ref 3.5–5.1)
Potassium: 5.1 mmol/L (ref 3.5–5.1)
Sodium: 134 mmol/L — ABNORMAL LOW (ref 135–145)
Sodium: 127 mmol/L — ABNORMAL LOW (ref 135–145)

## 2023-04-19 LAB — GLUCOSE, CAPILLARY
Glucose-Capillary: 106 mg/dL — ABNORMAL HIGH (ref 70–99)
Glucose-Capillary: 109 mg/dL — ABNORMAL HIGH (ref 70–99)
Glucose-Capillary: 113 mg/dL — ABNORMAL HIGH (ref 70–99)
Glucose-Capillary: 116 mg/dL — ABNORMAL HIGH (ref 70–99)
Glucose-Capillary: 117 mg/dL — ABNORMAL HIGH (ref 70–99)
Glucose-Capillary: 121 mg/dL — ABNORMAL HIGH (ref 70–99)
Glucose-Capillary: 126 mg/dL — ABNORMAL HIGH (ref 70–99)
Glucose-Capillary: 138 mg/dL — ABNORMAL HIGH (ref 70–99)
Glucose-Capillary: 144 mg/dL — ABNORMAL HIGH (ref 70–99)
Glucose-Capillary: 163 mg/dL — ABNORMAL HIGH (ref 70–99)
Glucose-Capillary: 181 mg/dL — ABNORMAL HIGH (ref 70–99)
Glucose-Capillary: 196 mg/dL — ABNORMAL HIGH (ref 70–99)
Glucose-Capillary: 198 mg/dL — ABNORMAL HIGH (ref 70–99)
Glucose-Capillary: 209 mg/dL — ABNORMAL HIGH (ref 70–99)

## 2023-04-19 LAB — POCT I-STAT 7, (LYTES, BLD GAS, ICA,H+H)
Acid-base deficit: 6 mmol/L — ABNORMAL HIGH (ref 0.0–2.0)
Bicarbonate: 19.3 mmol/L — ABNORMAL LOW (ref 20.0–28.0)
Calcium, Ion: 1.19 mmol/L (ref 1.15–1.40)
HCT: 29 % — ABNORMAL LOW (ref 39.0–52.0)
Hemoglobin: 9.9 g/dL — ABNORMAL LOW (ref 13.0–17.0)
O2 Saturation: 98 %
Patient temperature: 98.7
Potassium: 5.3 mmol/L — ABNORMAL HIGH (ref 3.5–5.1)
Sodium: 128 mmol/L — ABNORMAL LOW (ref 135–145)
TCO2: 20 mmol/L — ABNORMAL LOW (ref 22–32)
pCO2 arterial: 35.4 mm[Hg] (ref 32–48)
pH, Arterial: 7.345 — ABNORMAL LOW (ref 7.35–7.45)
pO2, Arterial: 107 mm[Hg] (ref 83–108)

## 2023-04-19 LAB — CBC
HCT: 31.9 % — ABNORMAL LOW (ref 39.0–52.0)
HCT: 30.6 % — ABNORMAL LOW (ref 39.0–52.0)
Hemoglobin: 11 g/dL — ABNORMAL LOW (ref 13.0–17.0)
Hemoglobin: 10.3 g/dL — ABNORMAL LOW (ref 13.0–17.0)
MCH: 32.7 pg (ref 26.0–34.0)
MCH: 32.4 pg (ref 26.0–34.0)
MCHC: 34.5 g/dL (ref 30.0–36.0)
MCHC: 33.7 g/dL (ref 30.0–36.0)
MCV: 94.9 fL (ref 80.0–100.0)
MCV: 96.2 fL (ref 80.0–100.0)
Platelets: 155 10*3/uL (ref 150–400)
Platelets: 102 10*3/uL — ABNORMAL LOW (ref 150–400)
RBC: 3.36 MIL/uL — ABNORMAL LOW (ref 4.22–5.81)
RBC: 3.18 MIL/uL — ABNORMAL LOW (ref 4.22–5.81)
RDW: 13.5 % (ref 11.5–15.5)
RDW: 13.8 % (ref 11.5–15.5)
WBC: 18.5 10*3/uL — ABNORMAL HIGH (ref 4.0–10.5)
WBC: 15.3 10*3/uL — ABNORMAL HIGH (ref 4.0–10.5)
nRBC: 0 % (ref 0.0–0.2)
nRBC: 0 % (ref 0.0–0.2)

## 2023-04-19 LAB — MAGNESIUM
Magnesium: 2.9 mg/dL — ABNORMAL HIGH (ref 1.7–2.4)
Magnesium: 2.8 mg/dL — ABNORMAL HIGH (ref 1.7–2.4)

## 2023-04-19 MED ORDER — ROSUVASTATIN CALCIUM 20 MG PO TABS
40.0000 mg | ORAL_TABLET | Freq: Every day | ORAL | Status: DC
  Administered 2023-04-20 – 2023-05-02 (×13): 40 mg via ORAL
  Filled 2023-04-19 (×13): qty 2

## 2023-04-19 MED ORDER — INSULIN ASPART 100 UNIT/ML IJ SOLN
0.0000 [IU] | INTRAMUSCULAR | Status: DC
  Administered 2023-04-19 (×2): 4 [IU] via SUBCUTANEOUS
  Administered 2023-04-19: 8 [IU] via SUBCUTANEOUS
  Administered 2023-04-20 (×2): 4 [IU] via SUBCUTANEOUS

## 2023-04-19 MED ORDER — FUROSEMIDE 10 MG/ML IJ SOLN
40.0000 mg | Freq: Once | INTRAMUSCULAR | Status: AC
  Administered 2023-04-19: 40 mg via INTRAVENOUS
  Filled 2023-04-19: qty 4

## 2023-04-19 MED ORDER — INSULIN GLARGINE-YFGN 100 UNIT/ML ~~LOC~~ SOLN
20.0000 [IU] | Freq: Every day | SUBCUTANEOUS | Status: DC
  Administered 2023-04-19: 20 [IU] via SUBCUTANEOUS
  Filled 2023-04-19 (×2): qty 0.2

## 2023-04-19 MED ORDER — ENOXAPARIN SODIUM 40 MG/0.4ML IJ SOSY
40.0000 mg | PREFILLED_SYRINGE | Freq: Every day | INTRAMUSCULAR | Status: DC
  Administered 2023-04-19 – 2023-04-20 (×2): 40 mg via SUBCUTANEOUS
  Filled 2023-04-19 (×2): qty 0.4

## 2023-04-19 MED ORDER — HYDROMORPHONE HCL 1 MG/ML IJ SOLN
1.0000 mg | INTRAMUSCULAR | Status: DC | PRN
  Administered 2023-04-19 – 2023-04-20 (×3): 1 mg via INTRAVENOUS
  Filled 2023-04-19 (×3): qty 1

## 2023-04-19 MED FILL — Mannitol IV Soln 20%: INTRAVENOUS | Qty: 500 | Status: AC

## 2023-04-19 MED FILL — Lidocaine HCl Local Soln Prefilled Syringe 100 MG/5ML (2%): INTRAMUSCULAR | Qty: 5 | Status: AC

## 2023-04-19 MED FILL — Sodium Bicarbonate IV Soln 8.4%: INTRAVENOUS | Qty: 50 | Status: AC

## 2023-04-19 MED FILL — Electrolyte-R (PH 7.4) Solution: INTRAVENOUS | Qty: 4000 | Status: AC

## 2023-04-19 MED FILL — Sodium Chloride IV Soln 0.9%: INTRAVENOUS | Qty: 2000 | Status: AC

## 2023-04-19 MED FILL — Potassium Chloride Inj 2 mEq/ML: INTRAVENOUS | Qty: 40 | Status: AC

## 2023-04-19 MED FILL — Lidocaine HCl Local Preservative Free (PF) Inj 2%: INTRAMUSCULAR | Qty: 14 | Status: AC

## 2023-04-19 MED FILL — Heparin Sodium (Porcine) Inj 1000 Unit/ML: Qty: 1000 | Status: AC

## 2023-04-19 MED FILL — Heparin Sodium (Porcine) Inj 1000 Unit/ML: INTRAMUSCULAR | Qty: 10 | Status: AC

## 2023-04-19 NOTE — Progress Notes (Signed)
 Physical Therapy Treatment Patient Details Name: NIMESH RANDOLF MRN: [id number] DOB: 1960-12-23 Today's Date: 04/19/2023   History of Present Illness Patient is 63 y.o. male who presented with dyspnea and is admitted for acute heart failure exacerbation. Of note pt had extended hospital stay from 08/20/22-10/28/22 during which he underwent surgical repair 08/30/22: T5-11 posterior/posterolateral arthrodesis  & Transpedicular decompression with partial corpectomy, left T8 & T8-9 laminectomy, bilateral facetectomy. 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 Since discharge in May 2024 pt has represented to ED for back pain x3 and for fall x1.    PT Comments  Pt now s/p CABGx1 on 04/18/23 presenting with excruciating 10+/10 back pain and orthostatic BP greatly limiting mobility at this time. With assist of PT and RN pt was able to stand at EOB and take 5 steps to [facility] with modAx2 however BP dropped to 60s/40s despite stating he was "Fine" and denied lightheadedness, pt returned to sidelying and made comfortable. BP increased to 110/78. Hopeful that when pt's pain managed he will progress well. Acute PT to cont to follow.    If plan is discharge home, recommend the following: Assist for transportation;Help with stairs or ramp for entrance;A lot of help with walking and/or transfers;A lot of help with bathing/dressing/bathroom   Can travel by private vehicle        Equipment Recommendations  Rolling walker (2 wheels)    Recommendations for Other Services       Precautions / Restrictions Precautions Precautions: Fall;Sternal Precaution Booklet Issued: No Precaution Comments: chest tube, watch BP - hypotensive, educated on "staying in the tube" Restrictions Weight Bearing Restrictions Per Provider  Order: Yes (sternal precautions) RUE Weight Bearing Per Provider Order: Non weight bearing LUE Weight Bearing Per Provider Order: Non weight bearing Other Position/Activity Restrictions: sternal precautions     Mobility  Bed Mobility Overal bed mobility: Needs Assistance Bed Mobility: Supine to Sit, Sit to Sidelying     Supine to sit: Mod assist   Sit to sidelying: Mod assist, +2 for physical assistance General bed mobility comments: HOB elevated, pt able to bring LEs towards EOB, modA for trunk elevation from [facility] and to scoot hips to EOB, modA for LE management back into bed, assist to control descent of trunk and line management    Transfers Overall transfer level: Needs assistance Equipment used: 1 person hand held assist Transfers: Sit to/from Stand Sit to Stand: Min assist, +2 physical assistance, +2 safety/equipment           General transfer comment: minA to power up, wide base of support, pt able to side step to [facility] with min/modA, pt with posterior bias, BP began to drop into 60s/40s via art line measurements    Ambulation/Gait               General Gait Details: limited to side stepping to [facility]   Stairs             Wheelchair Mobility     Tilt Bed    Modified Rankin (Stroke Patients Only)       Balance Overall balance assessment: Needs assistance Sitting-balance support: Feet supported Sitting balance-Leahy Scale: Good     Standing balance support: During functional activity, No upper extremity supported, Single extremity supported Standing balance-Leahy Scale: Fair  Cognition Arousal: Alert Behavior During Therapy: WFL for tasks assessed/performed Overall Cognitive Status: Within Functional Limits for tasks assessed                                          Exercises General Exercises - Lower Extremity Ankle Circles/Pumps: AROM, Both, 10 reps, Seated Long Arc Quad: AROM,  Both, 10 reps, Seated    General Comments General comments (skin integrity, edema, etc.): sternal incision with dressing intact. Pt with orthostatic hypotension dropping into 60/40s with standing however pt asymptomatic stating he wasn't dizzy but c/o of back pain      Pertinent Vitals/Pain Pain Assessment Pain Assessment: 0-10 Pain Score: 10-Worst pain ever Pain Location: back Pain Descriptors / Indicators: Aching, Discomfort, Constant, Dull Pain Intervention(s): Patient requesting pain meds-RN notified, RN gave pain meds during session    Home Living Family/patient expects to be discharged to:: Private residence Living Arrangements: Spouse/significant other;Other relatives Available Help at Discharge: Family Type of Home: House Home Access: Level entry       Home Layout: One level Home Equipment: Hand held shower head;Rolling Walker (2 wheels);Cane - quad;Wheelchair - manual;BSC/3in1 Additional Comments: currently staying with his cousin    Prior Function            PT Goals (current goals can now be found in the care plan section) Acute Rehab PT Goals Patient Stated Goal: get home and back to rehab PT Goal Formulation: With patient Time For Goal Achievement: 05/03/23 Potential to Achieve Goals: Good    Frequency    Min 1X/week      PT Plan      Co-evaluation              AM-PAC PT "6 Clicks" Mobility   Outcome Measure  Help needed turning from your back to your side while in a flat bed without using bedrails?: A Lot Help needed moving from lying on your back to sitting on the side of a flat bed without using bedrails?: A Lot Help needed moving to and from a bed to a chair (including a wheelchair)?: A Lot Help needed standing up from a chair using your arms (e.g., wheelchair or bedside chair)?: A Lot Help needed to walk in hospital room?: A Lot Help needed climbing 3-5 steps with a railing? : Total 6 Click Score: 11    End of Session Equipment  Utilized During Treatment: Gait belt Activity Tolerance: Patient tolerated treatment well Patient left: with call bell/phone within reach;in bed;with bed alarm set;with nursing/sitter in room Nurse Communication: Mobility status PT Visit Diagnosis: Unsteadiness on feet (R26.81);Difficulty in walking, not elsewhere classified (R26.2);Other symptoms and signs involving the nervous system (R29.898);Pain Pain - part of body:  (back)     Time: 1610-9604 PT Time Calculation (min) (ACUTE ONLY): 36 min  Charges:    $Therapeutic Activity: 8-22 mins PT General Charges $$ ACUTE PT VISIT: 1 Visit                     Renaee Caro, PT, DPT Acute Rehabilitation Services Secure chat preferred Office #: [phone]    Jenna Moan 04/19/2023, 2:15 PM

## 2023-04-19 NOTE — Progress Notes (Signed)
  Inpatient Rehab Admissions Coordinator :  Per therapy recommendations, patient was screened for CIR candidacy by Jeannetta Millman RN MSN.  At this time patient appears to be a potential candidate for CIR. Noted extended hospitalization last year due to housing issues.I will place a rehab consult per protocol for full assessment. Please call me with any questions.  Jeannetta Millman RN MSN Admissions Coordinator [phone]

## 2023-04-19 NOTE — Progress Notes (Signed)
   04/19/23 1358  Spiritual Encounters  Type of Visit Follow up  Care provided to: Patient  Conversation partners present during encounter Nurse  Referral source Chaplain team  Reason for visit Routine spiritual support   Chaplain attempting to follow up with Pt after his surgery yesterday, but Pt had just recently fallen asleep (according to RN) and he has a history of difficulty sleeping.  Thus, chaplain did not deem it appropriate to awaken him.  Will return Later.  Chaplain services remain available by Spiritual Consult or for emergent cases, paging [phone]  Chaplain Dewitte Foreman, MDiv [email or web address] [phone]

## 2023-04-19 NOTE — Plan of Care (Signed)
  Problem: Education: Goal: Will demonstrate proper wound care and an understanding of methods to prevent future damage Outcome: Progressing   Problem: Activity: Goal: Risk for activity intolerance will decrease Outcome: Progressing   Problem: Education: Goal: Will demonstrate proper wound care and an understanding of methods to prevent future damage Outcome: Progressing   Problem: Activity: Goal: Risk for activity intolerance will decrease Outcome: Progressing   Problem: Cardiac: Goal: Will achieve and/or maintain hemodynamic stability Outcome: Progressing   Problem: Clinical Measurements: Goal: Postoperative complications will be avoided or minimized Outcome: Progressing   Problem: Respiratory: Goal: Respiratory status will improve Outcome: Progressing   Problem: Skin Integrity: Goal: Wound healing without signs and symptoms of infection Outcome: Progressing   Problem: Urinary Elimination: Goal: Ability to achieve and maintain adequate renal perfusion and functioning will improve Outcome: Progressing

## 2023-04-19 NOTE — Progress Notes (Signed)
 1 Day Post-Op Procedure(s) (LRB): CORONARY ARTERY BYPASS GRAFTING (CABG) TIMES FOUR USING LEFT INTERNAL MAMMARY ARTERY AND ENDOSCOPICALLY HARVESTED LEFT GREATER SAPHENOUS VEIN (N/A) CLIPPING OF ATRIAL APPENDAGE USING ATRICURE ATRICLIP [id number] with pulmonary vein isolation ablation (N/A) TRANSESOPHAGEAL ECHOCARDIOGRAM (TEE) (N/A) Subjective: Did not sleep much. Pain under reasonable control.  Objective: Vital signs in last 24 hours: Temp:  [97.7 F (36.5 C)-100.2 F (37.9 C)] 98.4 F (36.9 C) (01/15 0600) Pulse Rate:  [49-140] 80 (01/15 0600) Cardiac Rhythm: Atrial fibrillation (01/14 2000) Resp:  [10-23] 14 (01/15 0600) BP: (86-133)/(59-91) 119/78 (01/15 0600) SpO2:  [84 %-100 %] 100 % (01/15 0600) Arterial Line BP: (79-267)/(34-84) 105/60 (01/15 0600) FiO2 (%):  [40 %-100 %] 40 % (01/14 1455) Weight:  [86.3 kg] 86.3 kg (01/15 0600)  Hemodynamic parameters for last 24 hours: PAP: (32-66)/(18-39) 46/25 CVP:  [6 mmHg-31 mmHg] 13 mmHg CO:  [3.9 L/min-5.3 L/min] 4.3 L/min CI:  [1.93 L/min/m2-2.6 L/min/m2] 2.14 L/min/m2  Intake/Output from previous day: 01/14 0701 - 01/15 0700 In: 5929.7 [P.O.:250; I.V.:2561.3; Blood:890; IV Piggyback:2228.4] Out: 2850 [Urine:2280; Chest Tube:570] Intake/Output this shift: No intake/output data recorded.  General appearance: alert and cooperative Neurologic: intact Heart: irregularly irregular rhythm Lungs: clear to auscultation bilaterally Extremities: edema mild Wound: dressing dry  Lab Results: Recent Labs    04/18/23 1842 04/19/23 0410  WBC 17.4* 18.5*  HGB 11.5* 11.0*  HCT 33.2* 31.9*  PLT 148* 155   BMET:  Recent Labs    04/18/23 1842 04/19/23 0410  NA 136 134*  K 4.8 4.4  CL 105 104  CO2 20* 21*  GLUCOSE 163* 95  BUN 24* 24*  CREATININE 1.60* 1.73*  CALCIUM  8.5* 8.4*    PT/INR:  Recent Labs    04/18/23 1408  LABPROT 18.3*  INR 1.5*   ABG    Component Value Date/Time   PHART 7.362 04/18/2023 1634   HCO3  22.0 04/18/2023 1634   TCO2 23 04/18/2023 1634   ACIDBASEDEF 3.0 (H) 04/18/2023 1634   O2SAT 92 04/18/2023 1634   CBG (last 3)  Recent Labs    04/19/23 0407 04/19/23 0512 04/19/23 0607  GLUCAP 109* 113* 117*   CXR: interstitial edema.  ECG: atrial fib 64.  Assessment/Plan: S/P Procedure(s) (LRB): CORONARY ARTERY BYPASS GRAFTING (CABG) TIMES FOUR USING LEFT INTERNAL MAMMARY ARTERY AND ENDOSCOPICALLY HARVESTED LEFT GREATER SAPHENOUS VEIN (N/A) CLIPPING OF ATRIAL APPENDAGE USING ATRICURE ATRICLIP [id number] with pulmonary vein isolation ablation (N/A) TRANSESOPHAGEAL ECHOCARDIOGRAM (TEE) (N/A)  POD 1  Hemodynamically stable. LVEF normal. Severe pulmonary HTN preop but improved postop, likely ischemic. DC milrinone . Wean vasopressors as tolerated.  Persistent atrial fib with controlled rate. S/p PV isolation ablation and LAA clip. Back in atrial fib postop. Started on IV amio last night for bigeminy. Will continue for now. Plan to resume Eliquis  prior to discharge. Keep pacing wires today.  Stage 3b CKD: creat stable at baseline. Expect it may rise some over the next few days with diuresis.  Poorly controlled DM preop with Hgb A1c of 7.4: Transition to Levemir  and SSI.   Dangle and DC chest tubes, swan, arterial line.  IS, OOB.  Will ask PT to see him due to disability from back surgery and deconditioning.   LOS: 9 days    Bartley Lightning 04/19/2023

## 2023-04-19 NOTE — Progress Notes (Signed)
   Patient Name: David [name] Date of Encounter: 04/19/2023 [facility] Cardiologist: Janelle Mediate, MD   Interval Summary  .    POD 1 LIMA to LAD, GV to PDA, VG to OM1 to OM3, PV ablation and LAA ligation  MAP 70-80s, HR 50-60s, I/O +3.1L  CO 4.1, CI 2.0  insulin /norepi/amio  Having a lot of back pain    Vital Signs .    Vitals:   04/19/23 0730 04/19/23 0745 04/19/23 0800 04/19/23 0815  BP:   115/68   Pulse:  69 67 65  Resp: 17 14 14 18   Temp: 98.1 F (36.7 C) 98.4 F (36.9 C) 98.6 F (37 C)   TempSrc:      SpO2:  96% 100% 98%  Weight:      Height:        Intake/Output Summary (Last 24 hours) at 04/19/2023 1005 Last data filed at 04/19/2023 0800 Gross per 24 hour  Intake 5407.21 ml  Output 2370 ml  Net 3037.21 ml      04/19/2023    6:00 AM 04/18/2023    5:00 AM 04/17/2023    5:18 AM  Last 3 Weights  Weight (lbs) 190 lb 4.1 oz 179 lb 7.3 oz 187 lb 6.3 oz  Weight (kg) 86.3 kg 81.4 kg 85 kg      Telemetry/ECG    SR occ A pacing - Personally Reviewed  Physical Exam .   GEN: Moderate distress due to back pain Neck: No JVD Cardiac: RRR, no murmurs, rubs, or gallops.  Respiratory: Clear to auscultation bilaterally. GI: Soft, nontender, non-distended  MS: No edema  Assessment & Plan .     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  Ideally would start plavix  due to ACS, stop ASA and start Eliquis  despite LAA ligation and PVI and make a decision about A/C as outpatient based on rhythm monitoring   Heart failure with improved EF EF normal on intra op TEE   Hypertension Requiring pressors now likely due to need for pain control.   Permanent atrial fibrillation S/p PVI and LAA ligation; would plan on Eliquis  at discharge and to monitor rhythm as outpatient   Hypercholesterolemia Increase crestor  to 40mg ; continue zetia .   Type 2 DM Start Jardiance 10mg  qday once foley is out.  Chronic pain syndrome CKD  Stage 3b For questions or updates, please contact [facility] Please consult [email or web address] for contact info under        Signed, Delois Silvester K Gaurav Baldree, MD

## 2023-04-20 ENCOUNTER — Inpatient Hospital Stay (HOSPITAL_COMMUNITY): Payer: Medicaid Other

## 2023-04-20 DIAGNOSIS — I214 Non-ST elevation (NSTEMI) myocardial infarction: Secondary | ICD-10-CM | POA: Diagnosis not present

## 2023-04-20 LAB — BASIC METABOLIC PANEL
Anion gap: 14 (ref 5–15)
Anion gap: 14 (ref 5–15)
BUN: 37 mg/dL — ABNORMAL HIGH (ref 8–23)
BUN: 47 mg/dL — ABNORMAL HIGH (ref 8–23)
CO2: 17 mmol/L — ABNORMAL LOW (ref 22–32)
CO2: 17 mmol/L — ABNORMAL LOW (ref 22–32)
Calcium: 8.6 mg/dL — ABNORMAL LOW (ref 8.9–10.3)
Calcium: 8.4 mg/dL — ABNORMAL LOW (ref 8.9–10.3)
Chloride: 96 mmol/L — ABNORMAL LOW (ref 98–111)
Chloride: 95 mmol/L — ABNORMAL LOW (ref 98–111)
Creatinine, Ser: 2.95 mg/dL — ABNORMAL HIGH (ref 0.61–1.24)
Creatinine, Ser: 3.88 mg/dL — ABNORMAL HIGH (ref 0.61–1.24)
GFR, Estimated: 23 mL/min — ABNORMAL LOW (ref >60)
GFR, Estimated: 17 mL/min — ABNORMAL LOW (ref >60)
Glucose, Bld: 198 mg/dL — ABNORMAL HIGH (ref 70–99)
Glucose, Bld: 209 mg/dL — ABNORMAL HIGH (ref 70–99)
Potassium: 5.1 mmol/L (ref 3.5–5.1)
Potassium: 4.9 mmol/L (ref 3.5–5.1)
Sodium: 127 mmol/L — ABNORMAL LOW (ref 135–145)
Sodium: 126 mmol/L — ABNORMAL LOW (ref 135–145)

## 2023-04-20 LAB — CBC
HCT: 29.6 % — ABNORMAL LOW (ref 39.0–52.0)
Hemoglobin: 10.2 g/dL — ABNORMAL LOW (ref 13.0–17.0)
MCH: 33.1 pg (ref 26.0–34.0)
MCHC: 34.5 g/dL (ref 30.0–36.0)
MCV: 96.1 fL (ref 80.0–100.0)
Platelets: 106 10*3/uL — ABNORMAL LOW (ref 150–400)
RBC: 3.08 MIL/uL — ABNORMAL LOW (ref 4.22–5.81)
RDW: 14 % (ref 11.5–15.5)
WBC: 16.5 10*3/uL — ABNORMAL HIGH (ref 4.0–10.5)
nRBC: 0 % (ref 0.0–0.2)

## 2023-04-20 LAB — GLUCOSE, CAPILLARY
Glucose-Capillary: 160 mg/dL — ABNORMAL HIGH (ref 70–99)
Glucose-Capillary: 165 mg/dL — ABNORMAL HIGH (ref 70–99)
Glucose-Capillary: 171 mg/dL — ABNORMAL HIGH (ref 70–99)
Glucose-Capillary: 174 mg/dL — ABNORMAL HIGH (ref 70–99)
Glucose-Capillary: 187 mg/dL — ABNORMAL HIGH (ref 70–99)
Glucose-Capillary: 193 mg/dL — ABNORMAL HIGH (ref 70–99)

## 2023-04-20 MED ORDER — INSULIN ASPART 100 UNIT/ML IJ SOLN
0.0000 [IU] | Freq: Three times a day (TID) | INTRAMUSCULAR | Status: DC
Start: 2023-04-20 — End: 2023-04-28
  Administered 2023-04-20 (×2): 4 [IU] via SUBCUTANEOUS
  Administered 2023-04-20 – 2023-04-21 (×2): 2 [IU] via SUBCUTANEOUS
  Administered 2023-04-21 (×2): 4 [IU] via SUBCUTANEOUS
  Administered 2023-04-22 – 2023-04-23 (×5): 2 [IU] via SUBCUTANEOUS
  Administered 2023-04-24: 4 [IU] via SUBCUTANEOUS
  Administered 2023-04-24: 2 [IU] via SUBCUTANEOUS
  Administered 2023-04-24: 4 [IU] via SUBCUTANEOUS
  Administered 2023-04-25: 2 [IU] via SUBCUTANEOUS
  Administered 2023-04-25 – 2023-04-26 (×3): 4 [IU] via SUBCUTANEOUS
  Administered 2023-04-26: 2 [IU] via SUBCUTANEOUS
  Administered 2023-04-26: 8 [IU] via SUBCUTANEOUS
  Administered 2023-04-27 (×2): 4 [IU] via SUBCUTANEOUS
  Administered 2023-04-27: 8 [IU] via SUBCUTANEOUS
  Administered 2023-04-28: 4 [IU] via SUBCUTANEOUS

## 2023-04-20 MED ORDER — INSULIN GLARGINE-YFGN 100 UNIT/ML ~~LOC~~ SOLN
25.0000 [IU] | Freq: Every day | SUBCUTANEOUS | Status: DC
  Administered 2023-04-20 – 2023-04-26 (×7): 25 [IU] via SUBCUTANEOUS
  Filled 2023-04-20 (×8): qty 0.25

## 2023-04-20 MED ORDER — DOXYCYCLINE HYCLATE 100 MG PO TABS
100.0000 mg | ORAL_TABLET | Freq: Two times a day (BID) | ORAL | Status: DC
Start: 2023-04-20 — End: 2023-05-02
  Administered 2023-04-20 – 2023-05-02 (×25): 100 mg via ORAL
  Filled 2023-04-20 (×25): qty 1

## 2023-04-20 MED ORDER — AMIODARONE HCL 200 MG PO TABS
200.0000 mg | ORAL_TABLET | Freq: Two times a day (BID) | ORAL | Status: DC
  Administered 2023-04-20 – 2023-04-27 (×15): 200 mg via ORAL
  Filled 2023-04-20 (×15): qty 1

## 2023-04-20 MED ORDER — MIDODRINE HCL 5 MG PO TABS
10.0000 mg | ORAL_TABLET | Freq: Three times a day (TID) | ORAL | Status: DC
  Administered 2023-04-20 – 2023-04-22 (×6): 10 mg via ORAL
  Filled 2023-04-20 (×7): qty 2

## 2023-04-20 MED ORDER — TRAMADOL HCL 50 MG PO TABS
50.0000 mg | ORAL_TABLET | Freq: Two times a day (BID) | ORAL | Status: DC | PRN
  Administered 2023-04-21: 50 mg via ORAL
  Filled 2023-04-20 (×2): qty 1

## 2023-04-20 MED FILL — Thrombin (Recombinant) For Soln 20000 Unit: CUTANEOUS | Qty: 1 | Status: AC

## 2023-04-20 NOTE — Progress Notes (Signed)
2 Days Post-Op Procedure(s) (LRB): CORONARY ARTERY BYPASS GRAFTING (CABG) TIMES FOUR USING LEFT INTERNAL MAMMARY ARTERY AND ENDOSCOPICALLY HARVESTED LEFT GREATER SAPHENOUS VEIN (N/A) CLIPPING OF ATRIAL APPENDAGE USING ATRICURE ATRICLIP [id number] with pulmonary vein isolation ablation (N/A) TRANSESOPHAGEAL ECHOCARDIOGRAM (TEE) (N/A) Subjective:  Some pain. Stood up this am but did not walk because nurse said he was shaky. NE weaned off.  Did not respond to diuretic yesterday. Creat increased to 2.95 this am.  Objective: Vital signs in last 24 hours: Temp:  [98.1 F (36.7 C)-98.8 F (37.1 C)] 98.6 F (37 C) (01/16 0403) Pulse Rate:  [54-195] 70 (01/16 0630) Cardiac Rhythm: Atrial fibrillation (01/16 0400) Resp:  [10-30] 11 (01/16 0630) BP: (78-125)/(49-80) 107/80 (01/16 0630) SpO2:  [67 %-100 %] 90 % (01/16 0630) Arterial Line BP: (64-150)/(33-97) 139/97 (01/16 0630) Weight:  [87.7 kg] 87.7 kg (01/16 0500)  Hemodynamic parameters for last 24 hours: PAP: (55-61)/(23-28) 57/28 CVP:  [16 mmHg-19 mmHg] 16 mmHg  Intake/Output from previous day: 01/15 0701 - 01/16 0700 In: 1262.4 [P.O.:240; I.V.:754.7; IV Piggyback:267.7] Out: 500 [Urine:360; Chest Tube:140] Intake/Output this shift: No intake/output data recorded.  General appearance: alert and cooperative Neurologic: intact Heart: irregularly irregular rhythm Lungs: clear to auscultation bilaterally Extremities: edema mild Wound: dressing dry  Lab Results: Recent Labs    04/19/23 1657 04/20/23 0207  WBC 15.3* 16.5*  HGB 10.3* 10.2*  HCT 30.6* 29.6*  PLT 102* 106*   BMET:  Recent Labs    04/19/23 1650 04/19/23 1652 04/20/23 0207  NA 127* 128* 127*  K 5.1 5.3* 5.1  CL 99  --  96*  CO2 18*  --  17*  GLUCOSE 255*  --  198*  BUN 31*  --  37*  CREATININE 2.24*  --  2.95*  CALCIUM 8.2*  --  8.6*    PT/INR:  Recent Labs    04/18/23 1408  LABPROT 18.3*  INR 1.5*   ABG    Component Value Date/Time   PHART  7.345 (L) 04/19/2023 1652   HCO3 19.3 (L) 04/19/2023 1652   TCO2 20 (L) 04/19/2023 1652   ACIDBASEDEF 6.0 (H) 04/19/2023 1652   O2SAT 98 04/19/2023 1652   CBG (last 3)  Recent Labs    04/19/23 1956 04/20/23 0004 04/20/23 0401  GLUCAP 196* 187* 174*   CXR: stable.  Assessment/Plan: S/P Procedure(s) (LRB): CORONARY ARTERY BYPASS GRAFTING (CABG) TIMES FOUR USING LEFT INTERNAL MAMMARY ARTERY AND ENDOSCOPICALLY HARVESTED LEFT GREATER SAPHENOUS VEIN (N/A) CLIPPING OF ATRIAL APPENDAGE USING ATRICURE ATRICLIP [id number] with pulmonary vein isolation ablation (N/A) TRANSESOPHAGEAL ECHOCARDIOGRAM (TEE) (N/A)  POD 2 Hemodynamics stable off NE.  Rhythm is atrial fib with controlled rate on IV amio. Will switch to po amio. He was on digoxin previously for rate control but will hold off on that with CKD and rising creat.  Acute kidney injury on stage 3b CKD: Hold off on further diuretics and follow. Expect his creat to plateau and improve.  DC pacing wires today. Plan to resume Eliquis at some point.  DM: glucose still a little high. Will increase SEMGLEE to 25.  Continue IS, mobilization, PT.  DC arterial line.   LOS: 10 days    David [name] 04/20/2023

## 2023-04-20 NOTE — Progress Notes (Signed)
  Inpatient Rehabilitation Admissions Coordinator   Met with patient at bedside for rehab assessment. We discussed goals and expectations of a possible CIR admit. He and his wife live with his cousin. Wife works 2 jobs and cousin works 9 am until 5 pm. He would have to reach independent level after any rehab to go home with intermittent assist. I will discuss case with Dr Riley Kill to determine if he feels this is realistic goal for CIR admit. I will follow up tomorrow to determine if CIR vs SNF rehab option moving forward.. Please call me with any questions.   Ottie Glazier, RN, MSN Rehab Admissions Coordinator [phone]

## 2023-04-20 NOTE — Progress Notes (Signed)
   Patient Name: David [name] Date of Encounter: 04/20/2023 [facility] Cardiologist: Charlton Haws, MD   Interval Summary  .    POD 2 LIMA to LAD, GV to PDA, VG to OM1 to OM3, PV ablation and LAA ligation  MAP 70--90s, HR 70s.  I/O +762  Pressors weaned off.  Cr up to 2.9  Having a lot of back pain    Vital Signs .    Vitals:   04/20/23 0403 04/20/23 0500 04/20/23 0600 04/20/23 0630  BP:   (!) 99/58 107/80  Pulse:   (!) 195 70  Resp:   16 11  Temp: 98.6 F (37 C)     TempSrc: Oral     SpO2:   (!) 67% 90%  Weight:  87.7 kg    Height:        Intake/Output Summary (Last 24 hours) at 04/20/2023 0743 Last data filed at 04/20/2023 0400 Gross per 24 hour  Intake 1262.38 ml  Output 500 ml  Net 762.38 ml      04/20/2023    5:00 AM 04/19/2023    6:00 AM 04/18/2023    5:00 AM  Last 3 Weights  Weight (lbs) 193 lb 5.5 oz 190 lb 4.1 oz 179 lb 7.3 oz  Weight (kg) 87.7 kg 86.3 kg 81.4 kg      Telemetry/ECG  AF PVCs - Personally Reviewed  Physical Exam .   GEN: NAD, AO x 3 Neck: No JVD Cardiac: Irregular RR, no murmurs, rubs, or gallops.  Respiratory: Mild exp wheezes R base GI: Soft, nontender, non-distended  MS: No edema  Assessment & Plan .     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  Ideally would start plavix due to ACS, stop ASA and start Eliquis due to AF; will optimize later this admission.   Heart failure with improved EF EF normal on intra op TEE   Hypertension Off pressors; somewhat hypotensive due (pain meds)   Permanent atrial fibrillation S/p PVI and LAA ligation; would plan on Eliquis at discharge and to monitor rhythm as outpatient   Hypercholesterolemia Continue Crestor 40, Zetia   Type 2 DM Will plan on SLGLTi later this admission once Cr has stabilized  Chronic pain syndrome Per CT surgery  CKD Stage 3b/AKI Holding lasix for now.  For questions or updates, please contact [facility] Please consult [email or web address] for contact info under        Signed, Orbie Pyo, MD

## 2023-04-20 NOTE — Progress Notes (Signed)
Patient ID: David [name], male   DOB: 08-19-1960, 63 y.o.   MRN: [id number] TCTS Evening Rounds:  Hemodynamically stable in sinus rhythm. SBP in the 90's so will start some midodrine.  Ambulated some.  Remains oliguric with 150 cc out today. PM labs pending.

## 2023-04-20 NOTE — Progress Notes (Signed)
Occupational Therapy Treatment Patient Details Name: David [name] MRN: [id number] DOB: 07-10-1960 Today's Date: 04/20/2023   History of present illness Patient is 63 y.o. male who presented with dyspnea and is admitted for acute heart failure exacerbation. Pt now s/p CABGx1 performed on 04/18/23. Of note pt had extended hospital stay from 08/20/22-10/28/22 during which he underwent surgical repair 08/30/22: T5-11 posterior/posterolateral arthrodesis  & Transpedicular decompression with partial corpectomy, left T8 & T8-9 laminectomy, bilateral facetectomy. 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 Since discharge in May 2024 pt has represented to ED for back pain x3 and for fall x1.   OT comments  OT re-eval completed as pt is now s/p CABGx1 on 04/18/23. Pt now presents with decreased activity tolerance, pain and episode of orthostatic hypotension affecting functional level, generalized B UE weakness (tested within sternal precautions), decreased balance, decreased knowledge of precautions, and decreased safety and independence with functional tasks. Pt currently demonstrates ability to complete UB ADLs Independent to Min assist, LB ADLs with Mod to Max assist +2, and functional step-pivot transfer with use of RW with Min to Mod assist +2 all while adhering to sternal precautions. Pt with episode of orthostatic hypotension this session with BP in sitting  reclined with feet elevated 105/78, 84/65 sitting with feet on the floor, and 101/55 once returned to supine in bed. Unable to obtain BP in standing. Pt also with AFiib rhythm throughout session with HR in the 70s-80s and O2 sat stable on 2L continuous O2 through nasal cannula throughout session. Pt participated well in session, has good safety awareness, good  insight into deficits, and is highly motivated to return to PLOF. Goals updated this day to reflect pt current functional level following CABG. Pt will benefit from continued acute skilled OT services to address deficits outlined below and increase safety and independence with functional tasks. Post acute discharge, pt will benefit from intensive inpatient skilled rehab services > 3 hours per day to maximize rehab potential.       If plan is discharge home, recommend the following:  Two people to help with walking and/or transfers;Two people to help with bathing/dressing/bathroom;Assistance with cooking/housework;Assist for transportation;Help with stairs or ramp for entrance   Equipment Recommendations  Tub/shower bench    Recommendations for Other Services Rehab consult    Precautions / Restrictions Precautions Precautions: Fall;Sternal Precaution Booklet Issued: No Precaution Comments: watch BP - hypotensive, educated on "staying in the tube" Restrictions Weight Bearing Restrictions Per Provider Order: Yes (sternal precautions) RUE Weight Bearing Per Provider Order: Non weight bearing LUE Weight Bearing Per Provider Order: Non weight bearing Other Position/Activity Restrictions: sternal precautions       Mobility Bed Mobility Overal bed mobility: Needs Assistance Bed Mobility: Sit to Sidelying         Sit to sidelying: Mod assist, +2 for physical assistance, HOB elevated General bed mobility comments: while holding heart pillow; Mod assist to elevate B LE into bed; occasional cues for technique/adhering to sternal precations    Transfers Overall transfer level: Needs assistance Equipment used: Rolling walker (2 wheels) (heart pillow) Transfers: Sit to/from Stand, Bed to chair/wheelchair/BSC Sit to Stand: Mod assist, +2 physical assistance, +2 safety/equipment (while holding heart pillow)     Step pivot transfers: Min assist, Mod assist     General transfer comment:  Mod +2 to power up and Min +2 once in standing; occasional cues for adhering to sternal precautions     Balance Overall balance assessment: Needs assistance Sitting-balance support: No upper extremity supported, Feet supported Sitting balance-Leahy Scale: Good     Standing balance support: Single extremity supported, Bilateral upper extremity supported, During functional activity Standing balance-Leahy Scale: Fair                             ADL either performed or assessed with clinical judgement   ADL Overall ADL's : Needs assistance/impaired Eating/Feeding: Independent;Sitting   Grooming: Set up;Sitting   Upper Body Bathing: Minimal assistance;Cueing for compensatory techniques;Sitting (cues for adhering to sternal precautions; with increased time)   Lower Body Bathing: Moderate assistance;Maximal assistance;Sit to/from stand;+2 for safety/equipment;Cueing for compensatory techniques;+2 for physical assistance (cues for adhering to sternal precautions; with increased time)   Upper Body Dressing : Minimal assistance;Sitting;Cueing for compensatory techniques (cues for adhering to sternal precautions; with increased time)   Lower Body Dressing: Maximal assistance;+2 for safety/equipment;+2 for physical assistance;Sit to/from stand;Cueing for compensatory techniques;Cueing for safety (cues for adhering to sternal precautions; with increased time)   Toilet Transfer: Minimal assistance;Moderate assistance;+2 for physical assistance;+2 for safety/equipment;Rolling walker (2 wheels);BSC/3in1 (step-pivot; cues for adhering to sternal precautions; with increased time) Toilet Transfer Details (indicate cue type and reason): Mod +2 to come to stand and Min +2 once in standingl simulated from chair to bed Toileting- Clothing Manipulation and Hygiene: Maximal assistance;Sit to/from stand;+2 for safety/equipment;+2 for physical assistance;Cueing for compensatory techniques (cues for  adhering to sternal precautions; with increased time)         General ADL Comments: Pt with decreased activity tolerance, pain, and episode of orthostatic hypotension affecting functional level this session.    Extremity/Trunk Assessment Upper Extremity Assessment Upper Extremity Assessment: Right hand dominant;Generalized weakness (tested within sternal precautions)   Lower Extremity Assessment Lower Extremity Assessment: Defer to PT evaluation        Vision       Perception     Praxis      Cognition Arousal: Alert Behavior During Therapy: [facility] for tasks assessed/performed Overall Cognitive Status: Within Functional Limits for tasks assessed                                 General Comments: AAOx4 and pleasant throughout session. Pt demonstrates ability to follow multi-step instructions consistently and demonstrates good safety awareness and insight into deficits. Pt also demonstrated overall good carryover of training in sternal precautions from prior session.        Exercises      Shoulder Instructions       General Comments Pt with episode of orthostatic hypotension during session with BP in sitting while reclined with feet elevated 105/78, 84/65 in sitting upright with feet on the floor, and 101/55 once returned to supine in the bed. Unable to obtain BP in standing. Pt also with AFiib rhythm throughout session with HR in the 70s-80s and O2 sat stable on 2L continuous O2 through nasal cannula throughout session. RN present during majority of session.    Pertinent Vitals/ Pain       Pain Assessment Pain Assessment: 0-10 Pain Score: 9  (7 at rest in chair; 9 following transfer from chair to bed) Pain Location: back; sternal at site of incision Pain Descriptors / Indicators: Aching, Discomfort, Constant, Dull,  Grimacing, Guarding, Sore Pain Intervention(s): Limited activity within patient's tolerance, Monitored during session, RN gave pain meds during  session, Repositioned  Home Living                                          Prior Functioning/Environment              Frequency           Progress Toward Goals  OT Goals(current goals can now be found in the care plan section)  Progress towards OT goals: Goals updated  Acute Rehab OT Goals Patient Stated Goal: to be independent again and return home OT Goal Formulation: With patient Time For Goal Achievement: 05/04/23 Potential to Achieve Goals: Good ADL Goals Pt Will Perform Grooming: with supervision;standing (adhering to sternal precautions) Pt Will Perform Lower Body Bathing: with contact guard assist;sitting/lateral leans;sit to/from stand (adhering to sternal precautions) Pt Will Perform Lower Body Dressing: with contact guard assist;sit to/from stand (adhering to sternal precautions) Pt Will Transfer to Toilet: with supervision;ambulating;bedside commode (adhering to sternal precautions; with least restrictive AD) Pt Will Perform Toileting - Clothing Manipulation and hygiene: with contact guard assist;sitting/lateral leans;sit to/from stand (adhering to sternal precautions) Pt Will Perform Tub/Shower Transfer: with supervision;ambulating;tub bench (with least restrictive AD; adhering to sternal precautions) Additional ADL Goal #1: Patient will safely gather ADL and toiletry items in room with Supervision in preparation for morning ADLs without LOB adhering to sternal precautions and without LOB using least restrictive AD.  Plan      Co-evaluation                 AM-PAC OT "6 Clicks" Daily Activity     Outcome Measure   Help from another person eating meals?: None Help from another person taking care of personal grooming?: A Little Help from another person toileting, which includes using toliet, bedpan, or urinal?: A Lot Help from another person bathing (including washing, rinsing, drying)?: A Lot Help from another person to put on and  taking off regular upper body clothing?: A Little Help from another person to put on and taking off regular lower body clothing?: A Lot 6 Click Score: 16    End of Session Equipment Utilized During Treatment: Rolling walker (2 wheels);Oxygen;Other (comment) (heart pillow)  OT Visit Diagnosis: Other abnormalities of gait and mobility (R26.89);Muscle weakness (generalized) (M62.81);Pain   Activity Tolerance Patient tolerated treatment well;Patient limited by pain;Treatment limited secondary to medical complications (Comment) (limited by orthostatic hypotension)   Patient Left in bed;with call bell/phone within reach;with nursing/sitter in room   Nurse Communication Mobility status;Other (comment) (vital signs)        Time: 5956-3875 OT Time Calculation (min): 21 min  Charges: OT General Charges $OT Visit: 1 Visit OT Evaluation $OT Re-eval: 1 Re-eval  Mildred Tuccillo "Orson Eva., OTR/L, MA Acute Rehab [phone]   Lendon Colonel 04/20/2023, 10:17 AM

## 2023-04-21 ENCOUNTER — Other Ambulatory Visit (HOSPITAL_COMMUNITY): Payer: Self-pay

## 2023-04-21 ENCOUNTER — Telehealth (HOSPITAL_COMMUNITY): Payer: Self-pay | Admitting: Pharmacy Technician

## 2023-04-21 ENCOUNTER — Ambulatory Visit: Payer: Medicaid Other | Admitting: Physical Therapy

## 2023-04-21 ENCOUNTER — Inpatient Hospital Stay (HOSPITAL_COMMUNITY): Payer: Medicaid Other

## 2023-04-21 DIAGNOSIS — I214 Non-ST elevation (NSTEMI) myocardial infarction: Secondary | ICD-10-CM | POA: Diagnosis not present

## 2023-04-21 LAB — BASIC METABOLIC PANEL
Anion gap: 13 (ref 5–15)
BUN: 56 mg/dL — ABNORMAL HIGH (ref 8–23)
CO2: 19 mmol/L — ABNORMAL LOW (ref 22–32)
Calcium: 8.5 mg/dL — ABNORMAL LOW (ref 8.9–10.3)
Chloride: 94 mmol/L — ABNORMAL LOW (ref 98–111)
Creatinine, Ser: 4.27 mg/dL — ABNORMAL HIGH (ref 0.61–1.24)
GFR, Estimated: 15 mL/min — ABNORMAL LOW (ref >60)
Glucose, Bld: 128 mg/dL — ABNORMAL HIGH (ref 70–99)
Potassium: 4.4 mmol/L (ref 3.5–5.1)
Sodium: 126 mmol/L — ABNORMAL LOW (ref 135–145)

## 2023-04-21 LAB — CBC
HCT: 27.5 % — ABNORMAL LOW (ref 39.0–52.0)
Hemoglobin: 9.3 g/dL — ABNORMAL LOW (ref 13.0–17.0)
MCH: 32.5 pg (ref 26.0–34.0)
MCHC: 33.8 g/dL (ref 30.0–36.0)
MCV: 96.2 fL (ref 80.0–100.0)
Platelets: 111 10*3/uL — ABNORMAL LOW (ref 150–400)
RBC: 2.86 MIL/uL — ABNORMAL LOW (ref 4.22–5.81)
RDW: 13.5 % (ref 11.5–15.5)
WBC: 12.9 10*3/uL — ABNORMAL HIGH (ref 4.0–10.5)
nRBC: 0 % (ref 0.0–0.2)

## 2023-04-21 LAB — GLUCOSE, CAPILLARY
Glucose-Capillary: 133 mg/dL — ABNORMAL HIGH (ref 70–99)
Glucose-Capillary: 192 mg/dL — ABNORMAL HIGH (ref 70–99)
Glucose-Capillary: 178 mg/dL — ABNORMAL HIGH (ref 70–99)
Glucose-Capillary: 222 mg/dL — ABNORMAL HIGH (ref 70–99)

## 2023-04-21 MED ORDER — HYDROMORPHONE HCL 1 MG/ML IJ SOLN
0.5000 mg | INTRAMUSCULAR | Status: DC | PRN
  Administered 2023-04-21 – 2023-04-24 (×5): 0.5 mg via INTRAVENOUS
  Filled 2023-04-21 (×6): qty 0.5

## 2023-04-21 MED ORDER — ENOXAPARIN SODIUM 30 MG/0.3ML IJ SOSY: 30.0000 mg | PREFILLED_SYRINGE | Freq: Every day | INTRAMUSCULAR | Status: DC

## 2023-04-21 MED ORDER — HEPARIN SODIUM (PORCINE) 5000 UNIT/ML IJ SOLN
5000.0000 [IU] | Freq: Three times a day (TID) | INTRAMUSCULAR | Status: DC
  Administered 2023-04-21 – 2023-04-24 (×8): 5000 [IU] via SUBCUTANEOUS
  Filled 2023-04-21 (×8): qty 1

## 2023-04-21 MED ORDER — GABAPENTIN 100 MG PO CAPS
200.0000 mg | ORAL_CAPSULE | Freq: Three times a day (TID) | ORAL | Status: DC
  Administered 2023-04-21 – 2023-05-01 (×33): 200 mg via ORAL
  Filled 2023-04-21 (×34): qty 2

## 2023-04-21 MED ORDER — ORAL CARE MOUTH RINSE: 15.0000 mL | OROMUCOSAL | Status: DC | PRN

## 2023-04-21 NOTE — Progress Notes (Addendum)
3 Days Post-Op Procedure(s) (LRB): CORONARY ARTERY BYPASS GRAFTING (CABG) TIMES FOUR USING LEFT INTERNAL MAMMARY ARTERY AND ENDOSCOPICALLY HARVESTED LEFT GREATER SAPHENOUS VEIN (N/A) CLIPPING OF ATRIAL APPENDAGE USING ATRICURE ATRICLIP [id number] with pulmonary vein isolation ablation (N/A) TRANSESOPHAGEAL ECHOCARDIOGRAM (TEE) (N/A) Subjective:  No specific complaints. Slept better. Pain under control. No SOB. Did not walk yet today because he felt wobbly after pain meds.  Passing flatus but no BM yet.  Objective: Vital signs in last 24 hours: Temp:  [97.8 F (36.6 C)-99 F (37.2 C)] 98.7 F (37.1 C) (01/17 0700) Pulse Rate:  [58-75] 67 (01/17 0700) Cardiac Rhythm: Atrial fibrillation (01/16 2000) Resp:  [7-25] 11 (01/17 0700) BP: (92-115)/(59-82) 103/70 (01/17 0700) SpO2:  [91 %-100 %] 91 % (01/17 0700) Weight:  [87.7 kg] 87.7 kg (01/17 0530)  Hemodynamic parameters for last 24 hours:    Intake/Output from previous day: 01/16 0701 - 01/17 0700 In: 193 [I.V.:93; IV Piggyback:100] Out: 291 [Urine:291] Intake/Output this shift: No intake/output data recorded.  General appearance: alert and cooperative Neurologic: intact Heart: irregularly irregular rhythm Lungs: clear to auscultation bilaterally Extremities: edema mild Wound: incision healing well  Lab Results: Recent Labs    04/20/23 0207 04/21/23 0323  WBC 16.5* 12.9*  HGB 10.2* 9.3*  HCT 29.6* 27.5*  PLT 106* 111*   BMET:  Recent Labs    04/20/23 1710 04/21/23 0323  NA 126* 126*  K 4.9 4.4  CL 95* 94*  CO2 17* 19*  GLUCOSE 209* 128*  BUN 47* 56*  CREATININE 3.88* 4.27*  CALCIUM 8.4* 8.5*    PT/INR:  Recent Labs    04/18/23 1408  LABPROT 18.3*  INR 1.5*   ABG    Component Value Date/Time   PHART 7.345 (L) 04/19/2023 1652   HCO3 19.3 (L) 04/19/2023 1652   TCO2 20 (L) 04/19/2023 1652   ACIDBASEDEF 6.0 (H) 04/19/2023 1652   O2SAT 98 04/19/2023 1652   CBG (last 3)  Recent Labs     04/20/23 1600 04/20/23 2154 04/21/23 0709  GLUCAP 165* 171* 133*    Assessment/Plan: S/P Procedure(s) (LRB): CORONARY ARTERY BYPASS GRAFTING (CABG) TIMES FOUR USING LEFT INTERNAL MAMMARY ARTERY AND ENDOSCOPICALLY HARVESTED LEFT GREATER SAPHENOUS VEIN (N/A) CLIPPING OF ATRIAL APPENDAGE USING ATRICURE ATRICLIP [id number] with pulmonary vein isolation ablation (N/A) TRANSESOPHAGEAL ECHOCARDIOGRAM (TEE) (N/A)  POD 4  Hemodynamically stable in permanent atrial fib with HR 70's on po amio. No beta blocker due to low normal BP on midodrine.  AKI on stage 3b CKD: creat upto 4.27 this am but UO has been about 30/hr. Hold off on diuresis and maintain adequate BP. This should improve with time. Will consult nephrology.  DM: glucose under adequate control.  Hold off on starting Eliquis. He has LAA clip. Bleeding risk is still higher than stroke risk.  Continue IS, ambulation.  Will keep in ICU until kidney function improves.   LOS: 11 days    Alleen Borne 04/21/2023

## 2023-04-21 NOTE — Progress Notes (Signed)
      [address]       [place] [address]             [phone]      Up in chair  BP 122/79   Pulse 67   Temp 98 F (36.7 C) (Oral)   Resp 13   Ht 6' (1.829 m)   Wt 87.7 kg   SpO2 90%   BMI 26.22 kg/m  RA 95% sat  Intake/Output Summary (Last 24 hours) at 04/21/2023 1645 Last data filed at 04/21/2023 0919 Gross per 24 hour  Intake 10 ml  Output 261 ml  Net -251 ml   Monitor UO, renal function  Viviann Spare C. Dorris Fetch, MD [facility] [phone]

## 2023-04-21 NOTE — Progress Notes (Signed)
Inpatient Rehabilitation Admissions Coordinator   I met with patient at bedside. I discussed that he is not felt to be able to reach an independent level after a 2 week stay at CIR to go home alone for extended period of time as his wife works 2 jobs. We recommend SNF rehab to give longer recovery time before returning home with intermittent assist. I will notify acute team and TOC. We will signoff.  Ottie Glazier, RN, MSN Rehab Admissions Coordinator [phone] 04/21/2023 12:49 PM

## 2023-04-21 NOTE — Consult Note (Signed)
Nephrology Consult   Requesting provider: Evelene Croon Service requesting consult: CT surg Reason for consult: AKI on CKD 3a   Assessment/Recommendations: David [name] is a/an 63 y.o. male with a past medical history HFmrEF, Atrial fibrillation, HTN, DM 2 Who presents with shortness of breath now status post CABG, Now complicated by AKI  Oliguric AKI on CKD 3 8: Baseline creatinine around 1.4-1.7.  Rising creatinine since surgery likely ATN after surgery possible cholesterol embolic injury. -No need for fluids or diuresis at this time -Obtain C3, urinalysis, renal ultrasound -Hoping for recovery with time -Continue to monitor daily Cr, Dose meds for GFR -Monitor Daily I/Os, Daily weight  -Maintain MAP>65 for optimal renal perfusion.  -Avoid nephrotoxic medications including NSAIDs -Use synthetic opioids (Fentanyl/Dilaudid) if needed -Currently no indication for HD  NSTEMI: Status post CABG on 1/14.  Cardiology and surgery following  Atrial fibrillation: Eliquis on discharge  HLD: On Crestor  DM 2: Management per primary team  Chronic pain: Management per primary team  Heart failure: Ejection fraction normal during surgery.  Continue to monitor volume status  Hyponatremia: Likely in the setting of AKI with free water retention.  Continue monitor closely  Metabolic acidosis: Bicarb slightly decreased.  Continue monitor without therapy  Anemia: Likely multifactorial.  No ESA for now.  Transfusions if needed   Recommendations conveyed to primary service.    [name] 04/21/2023 1:17 PM   _____________________________________________________________________________________ CC: Shortness of breath  History of Present Illness: David [name] is a/an 63 y.o. male with a past medical history of HFmrEF, Atrial fibrillation, HTN, DM 2 who presents with Shortness of breath.  Patient presented 12 days ago with worsening shortness of breath in  the outpatient setting.It was felt he had a heart failure exacerbation.  Troponin was also elevated. Ultimately after admission and workup he Underwent CABG on 1/14.Procedure went relatively well.  Ejection fraction did seem to be normal when evaluated during the surgery. Patient's baseline creatinine seems to be around 1.4-1.7.  The patient's creatinine was 1.6 on the day of surgery.  Did start to increase after surgery and now is up to 4.3.  The patient has not exhibited extensive volume overload.  He denies having any nausea or vomiting.  Does have mild shortness of breath and chest pain which has been stable.  Also having some dizziness.  Did require pressors around the time of surgery but no longer on any at this time.   Medications:  Current Facility-Administered Medications  Medication Dose Route Frequency Provider Last Rate Last Admin   acetaminophen (TYLENOL) tablet 1,000 mg  1,000 mg Oral Q6H Roddenberry, Myron G, PA-C   1,000 mg at 04/21/23 1132   Or   acetaminophen (TYLENOL) 160 MG/5ML solution 1,000 mg  1,000 mg Per Tube Q6H Roddenberry, Myron G, PA-C       amiodarone (PACERONE) tablet 200 mg  200 mg Oral BID Alleen Borne, MD   200 mg at 04/21/23 1610   aspirin EC tablet 325 mg  325 mg Oral Daily Leary Roca, PA-C   325 mg at 04/21/23 9604   Or   aspirin chewable tablet 324 mg  324 mg Per Tube Daily Roddenberry, Myron G, PA-C       bisacodyl (DULCOLAX) EC tablet 10 mg  10 mg Oral Daily Roddenberry, Myron G, PA-C   10 mg at 04/21/23 5409   Or   bisacodyl (DULCOLAX) suppository 10 mg  10 mg Rectal Daily Roddenberry,  Cecille Amsterdam, PA-C       Chlorhexidine Gluconate Cloth 2 % PADS 6 each  6 each Topical Daily Alleen Borne, MD   6 each at 04/21/23 1135   dextrose 50 % solution 0-50 mL  0-50 mL Intravenous PRN Roddenberry, Myron G, PA-C       docusate sodium (COLACE) capsule 200 mg  200 mg Oral Daily Roddenberry, Myron G, PA-C   200 mg at 04/21/23 0910   doxycycline (VIBRA-TABS)  tablet 100 mg  100 mg Oral Q12H Alleen Borne, MD   100 mg at 04/21/23 0910   ezetimibe (ZETIA) tablet 10 mg  10 mg Oral Daily Roddenberry, Myron G, PA-C   10 mg at 04/21/23 0911   fluticasone (FLONASE) 50 MCG/ACT nasal spray 2 spray  2 spray Each Nare Daily Roddenberry, Myron G, PA-C       gabapentin (NEURONTIN) capsule 200 mg  200 mg Oral TID Alleen Borne, MD   200 mg at 04/21/23 1027   heparin injection 5,000 Units  5,000 Units Subcutaneous Q8H Alleen Borne, MD       HYDROmorphone (DILAUDID) injection 0.5 mg  0.5 mg Intravenous Q2H PRN Alleen Borne, MD   0.5 mg at 04/21/23 1131   insulin aspart (novoLOG) injection 0-24 Units  0-24 Units Subcutaneous TID WC Alleen Borne, MD   4 Units at 04/21/23 1227   insulin glargine-yfgn (SEMGLEE) injection 25 Units  25 Units Subcutaneous Daily Alleen Borne, MD   25 Units at 04/21/23 1123   midodrine (PROAMATINE) tablet 10 mg  10 mg Oral TID WC Alleen Borne, MD   10 mg at 04/21/23 1132   ondansetron (ZOFRAN) injection 4 mg  4 mg Intravenous Q6H PRN Roddenberry, Myron G, PA-C       Oral care mouth rinse  15 mL Mouth Rinse PRN Alleen Borne, MD       oxyCODONE (Oxy IR/ROXICODONE) immediate release tablet 5-10 mg  5-10 mg Oral Q3H PRN Roddenberry, Myron G, PA-C   10 mg at 04/21/23 1027   pantoprazole (PROTONIX) EC tablet 40 mg  40 mg Oral Daily Roddenberry, Myron G, PA-C   40 mg at 04/21/23 0911   rosuvastatin (CRESTOR) tablet 40 mg  40 mg Oral Daily Orbie Pyo, MD   40 mg at 04/21/23 0910   sodium chloride flush (NS) 0.9 % injection 10-40 mL  10-40 mL Intracatheter Q12H Alleen Borne, MD   10 mL at 04/20/23 2108   sodium chloride flush (NS) 0.9 % injection 10-40 mL  10-40 mL Intracatheter PRN Alleen Borne, MD       sodium chloride flush (NS) 0.9 % injection 3 mL  3 mL Intravenous Q12H Roddenberry, Myron G, PA-C   3 mL at 04/21/23 0919   sodium chloride flush (NS) 0.9 % injection 3 mL  3 mL Intravenous PRN Roddenberry, Myron G,  PA-C       sodium chloride flush (NS) 0.9 % injection 3-10 mL  3-10 mL Intravenous Q12H Roddenberry, Myron G, PA-C   3 mL at 04/20/23 2108   sodium chloride flush (NS) 0.9 % injection 3-10 mL  3-10 mL Intravenous PRN Roddenberry, Myron G, PA-C       traMADol (ULTRAM) tablet 50 mg  50 mg Oral Q12H PRN Alleen Borne, MD   50 mg at 04/21/23 1213     ALLERGIES Patient has no known allergies.  MEDICAL HISTORY Past Medical History:  Diagnosis Date  Cardiomyopathy (HCC)    a. EF 45% in 2019.   CHF (congestive heart failure) (HCC)    Chronic anticoagulation 09/30/2017   Diabetes mellitus type 2 in nonobese [facility])    Discitis of thoracic region 09/25/2022   Does not have health insurance    Essential hypertension 09/26/2017   Financial difficulties    H/O noncompliance with medical treatment, presenting hazards to health    Hardware complicating wound infection (HCC) 09/25/2022   Non-insulin treated type 2 diabetes mellitus (HCC) 09/26/2017   Persistent atrial fibrillation (HCC)    Sleep apnea suspected 09/30/2017     SOCIAL HISTORY Social History   Socioeconomic History   Marital status: Married    Spouse name: Not on file   Number of children: Not on file   Years of education: Not on file   Highest education level: Some college, no degree  Occupational History   Not on file  Tobacco Use   Smoking status: Former    Current packs/day: 0.00    Types: Cigarettes    Start date: 03/19/2003    Quit date: 03/18/2018    Years since quitting: 5.0   Smokeless tobacco: Never   Tobacco comments:    09/26/2017 "2-3 cigarettes/month now"  Vaping Use   Vaping status: Never Used  Substance and Sexual Activity   Alcohol use: Not Currently    Alcohol/week: 3.0 standard drinks of alcohol    Types: 3 Cans of beer per week    Comment: States he quit drinking 8-9 months ago   Drug use: Never   Sexual activity: Not Currently  Other Topics Concern   Not on file  Social History Narrative    Not on file   Social Drivers of Health   Financial Resource Strain: Medium Risk (03/01/2023)   Overall Financial Resource Strain (CARDIA)    Difficulty of Paying Living Expenses: Somewhat hard  Food Insecurity: Food Insecurity Present (04/17/2023)   Hunger Vital Sign    Worried About Running Out of Food in the Last Year: Never true    Ran Out of Food in the Last Year: Sometimes true  Transportation Needs: Unmet Transportation Needs (04/17/2023)   PRAPARE - Transportation    Lack of Transportation (Medical): Yes    Lack of Transportation (Non-Medical): Patient declined  Physical Activity: Insufficiently Active (03/01/2023)   Exercise Vital Sign    Days of Exercise per Week: 3 days    Minutes of Exercise per Session: 30 min  Stress: Stress Concern Present (03/01/2023)   [employer] of Occupational Health - Occupational Stress Questionnaire    Feeling of Stress : To some extent  Social Connections: Unknown (03/01/2023)   Social Connection and Isolation Panel [NHANES]    Frequency of Communication with Friends and Family: Once a week    Frequency of Social Gatherings with Friends and Family: Once a week    Attends Religious Services: Patient declined    Database administrator or Organizations: No    Attends Engineer, structural: Not on file    Marital Status: Married  Catering manager Violence: Not At Risk (04/10/2023)   Humiliation, Afraid, Rape, and Kick questionnaire    Fear of Current or Ex-Partner: No    Emotionally Abused: No    Physically Abused: No    Sexually Abused: No     FAMILY HISTORY Family History  Problem Relation Age of Onset   Hypertension Mother       Review of Systems: 12 systems reviewed Otherwise  as per HPI, all other systems reviewed and negative  Physical Exam: Vitals:   04/21/23 0800 04/21/23 1200  BP: 105/71   Pulse: 70   Resp: 17   Temp:  97.6 F (36.4 C)  SpO2: 91%    Total I/O In: 10 [I.V.:10] Out: -   Intake/Output  Summary (Last 24 hours) at 04/21/2023 1317 Last data filed at 04/21/2023 0919 Gross per 24 hour  Intake 10 ml  Output 261 ml  Net -251 ml   General: well-appearing, no acute distress HEENT: anicteric sclera, oropharynx clear without lesions CV: Normal rate, no murmurs, trace edema in the ankles Lungs: clear to auscultation bilaterally, normal work of breathing Abd: soft, non-tender, non-distended Skin: no visible lesions or rashes Psych: alert, engaged, appropriate mood and affect Musculoskeletal: no obvious deformities Neuro: normal speech, no gross focal deficits   Test Results Reviewed Lab Results  Component Value Date   NA 126 (L) 04/21/2023   K 4.4 04/21/2023   CL 94 (L) 04/21/2023   CO2 19 (L) 04/21/2023   BUN 56 (H) 04/21/2023   CREATININE 4.27 (H) 04/21/2023   CALCIUM 8.5 (L) 04/21/2023   ALBUMIN 3.5 04/09/2023   PHOS 4.0 08/31/2022    CBC Recent Labs  Lab 04/19/23 1657 04/20/23 0207 04/21/23 0323  WBC 15.3* 16.5* 12.9*  HGB 10.3* 10.2* 9.3*  HCT 30.6* 29.6* 27.5*  MCV 96.2 96.1 96.2  PLT 102* 106* 111*    I have reviewed all relevant outside healthcare records related to the patient's current hospitalization

## 2023-04-21 NOTE — Progress Notes (Signed)
Physical Therapy Treatment Patient Details Name: NHAN MARANDOLA MRN: [id number] DOB: Sep 11, 1960 Today's Date: 04/21/2023   History of Present Illness Patient is 63 y.o. male who presented with dyspnea and is admitted for acute heart failure exacerbation. Pt now s/p CABGx1 performed on 04/18/23. Of note pt had extended hospital stay from 08/20/22-10/28/22 during which he underwent surgical repair 08/30/22: T5-11 posterior/posterolateral arthrodesis  & Transpedicular decompression with partial corpectomy, left T8 & T8-9 laminectomy, bilateral facetectomy. 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 Since discharge in May 2024 pt has represented to ED for back pain x3 and for fall x1.    PT Comments  Pt is making steady progress towards goals. Pt was Mod A + 2 for sit to stand and Min A +2 for 300 ft of gait this session. Pt continues to unsteady with heavy UE support and difficulty maintaining sternal precautions during dynamic functional activities placing pt at a high risk for falls. Pt was denied CIR. Due to pt current functional status, home set up and available assistance at home recommending skilled physical therapy services < 3 hours/day in order to address strength, balance and functional mobility to decrease risk for falls, injury, immobility, skin break down and re-hospitalization.      If plan is discharge home, recommend the following: Assist for transportation;Help with stairs or ramp for entrance;A lot of help with walking and/or transfers     Equipment Recommendations  Other (comment) (defer to post acute)       Precautions / Restrictions Precautions Precautions: Fall;Sternal Precaution Booklet Issued: No Precaution Comments: watch BP - hypotensive,requires heavy cueing to maintain  precautions Restrictions Weight Bearing Restrictions Per Provider Order: Yes RUE Weight Bearing Per Provider Order: Non weight bearing LUE Weight Bearing Per Provider Order: Non weight bearing Other Position/Activity Restrictions: sternal precautions     Mobility  Bed Mobility     General bed mobility comments: pt was up in recliner on arrival and departure    Transfers Overall transfer level: Needs assistance Equipment used: None Transfers: Sit to/from Stand Sit to Stand: Mod assist, +2 physical assistance, +2 safety/equipment           General transfer comment: heavy cueing to prevent pt from pushing up or grabbing at walker.    Ambulation/Gait Ambulation/Gait assistance: Min assist, +2 physical assistance, +2 safety/equipment Gait Distance (Feet): 300 Feet Assistive device: Rollator (4 wheels) (EVA Walker) Gait Pattern/deviations: Step-through pattern, Wide base of support, Drifts right/left, Decreased stride length Gait velocity: decreased Gait velocity interpretation: <1.8 ft/sec, indicate of risk for recurrent falls   General Gait Details: very short steps. Min A to prevent rolling walker from rolling away from pt and pt has heavy UE support on eva walker with difficulty correcting. Pt takes very short, low clearance uneven steps. Min A for overall balance during gait. Pt is at a high risk for falls.        Balance Overall balance assessment: Needs assistance Sitting-balance support: No upper extremity supported, Feet supported Sitting balance-Leahy Scale: Fair     Standing balance support: Single extremity supported, Bilateral upper extremity supported, During functional activity, Reliant on assistive device for balance Standing balance-Leahy Scale: Poor Standing balance comment: Min A to prevent falls. Pt is very unsteady during dynamic activities.        Cognition  Arousal: Alert Behavior During Therapy: WFL for tasks assessed/performed Overall  Cognitive Status: Within Functional Limits for tasks assessed       General Comments: Pt has poor insight into deficits and difficulty following sternal precautions.           General Comments General comments (skin integrity, edema, etc.): Pt continues with soft BP after gait. Pt reports slight increase in dizziness at end of gait. During gait pt was denying dizziness/lightheadedness.      Pertinent Vitals/Pain Pain Assessment Pain Assessment: Faces Faces Pain Scale: Hurts even more Facial Expression: Relaxed, neutral Body Movements: Protection Muscle Tension: Tense, rigid Compliance with ventilator (intubated pts.): N/A Vocalization (extubated pts.): Talking in normal tone or no sound CPOT Total: 2 Pain Location: back; sternal at site of incision Pain Descriptors / Indicators: Grimacing, Guarding, Discomfort Pain Intervention(s): Limited activity within patient's tolerance, Monitored during session, Premedicated before session           PT Goals (current goals can now be found in the care plan section) Acute Rehab PT Goals Patient Stated Goal: get home and back to rehab PT Goal Formulation: With patient Time For Goal Achievement: 05/03/23 Potential to Achieve Goals: Good Progress towards PT goals: Progressing toward goals    Frequency    Min 1X/week      PT Plan  Updated discharge recommendations due to CIR denial.        AM-PAC PT "6 Clicks" Mobility   Outcome Measure  Help needed turning from your back to your side while in a flat bed without using bedrails?: A Lot Help needed moving from lying on your back to sitting on the side of a flat bed without using bedrails?: A Lot Help needed moving to and from a bed to a chair (including a wheelchair)?: A Lot Help needed standing up from a chair using your arms (e.g., wheelchair or bedside chair)?: Total Help needed to walk in hospital room?: Total Help needed climbing 3-5 steps with a railing? : Total 6 Click  Score: 9    End of Session Equipment Utilized During Treatment: Gait belt Activity Tolerance: Patient tolerated treatment well Patient left: with call bell/phone within reach;in bed;with bed alarm set Nurse Communication: Mobility status PT Visit Diagnosis: Unsteadiness on feet (R26.81);Difficulty in walking, not elsewhere classified (R26.2);Other symptoms and signs involving the nervous system (R29.898);Pain     Time: 4742-5956 PT Time Calculation (min) (ACUTE ONLY): 24 min  Charges:    $Gait Training: 8-22 mins $Therapeutic Activity: 8-22 mins PT General Charges $$ ACUTE PT VISIT: 1 Visit                     Harrel Carina, DPT, CLT  Acute Rehabilitation Services Office: [phone] (Secure chat preferred)    Claudia Desanctis 04/21/2023, 4:46 PM

## 2023-04-21 NOTE — Telephone Encounter (Addendum)
Patient Product/process development scientist completed.    The patient is insured through [employer].     Ran test claim for Repatha Sureclick 140 mg/ml and Requires Prior Authorization  Ran test claim for Praluent 150 mg/ml and Requires Prior Authorization  This test claim was processed through [employer]- copay amounts may vary at other pharmacies due to [employer], or as the patient moves through the different stages of their insurance plan.     Roland Earl, CPHT Pharmacy Technician III Certified Patient Advocate [facility] Patient Advocate Team Direct Number: [phone]  Fax: [phone]

## 2023-04-21 NOTE — Progress Notes (Signed)
   Patient Name: David [name] Date of Encounter: 04/21/2023 [facility] Cardiologist: Charlton Haws, MD   Interval Summary  .    POD 3 LIMA to LAD, GV to PDA, VG to OM1 to OM3, PV ablation and LAA ligation  MAP 70--90s, HR 60-70s.  I/O +762  Cr up to 4.7  Patient feeling better.  Pain well controlled.  Vital Signs .    Vitals:   04/21/23 0530 04/21/23 0600 04/21/23 0700 04/21/23 0800  BP:  102/63 103/70 105/71  Pulse:  71 67 70  Resp:  18 11 17   Temp:   98.7 F (37.1 C)   TempSrc:   Oral   SpO2:  94% 91% 91%  Weight: 87.7 kg     Height:        Intake/Output Summary (Last 24 hours) at 04/21/2023 0806 Last data filed at 04/21/2023 0600 Gross per 24 hour  Intake 26.41 ml  Output 291 ml  Net -264.59 ml      04/21/2023    5:30 AM 04/20/2023    5:00 AM 04/19/2023    6:00 AM  Last 3 Weights  Weight (lbs) 193 lb 5.5 oz 193 lb 5.5 oz 190 lb 4.1 oz  Weight (kg) 87.7 kg 87.7 kg 86.3 kg      Telemetry/ECG  AF  - Personally Reviewed  Physical Exam .   GEN: NAD, AO x 3 Neck: RIJ sheath Cardiac: Irregular RR, no murmurs, rubs, or gallops.  Respiratory: Clear GI: Soft, nontender, non-distended  MS: No edema  Assessment & Plan .     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  Ideally would start plavix due to ACS, at some point, recommend stopping ASA and starting Eliquis due to AF   Heart failure with improved EF EF normal on intra op TEE   Hypertension Off pressors; somewhat hypotensive due (pain meds)   Permanent atrial fibrillation S/p PVI and LAA ligation; would plan on Eliquis at discharge    Hypercholesterolemia Continue Crestor 40, Zetia   Type 2 DM Will plan on SLGLTi later this admission once Cr has stabilized if GFR > 20  Chronic pain syndrome Per CT surgery  AKI/ATN Monitor for now, no need for renal replacement at this time; lytes stable and making some urine  For questions or updates, please  contact [facility] Please consult [email or web address] for contact info under        Signed, Orbie Pyo, MD

## 2023-04-22 DIAGNOSIS — I214 Non-ST elevation (NSTEMI) myocardial infarction: Secondary | ICD-10-CM | POA: Diagnosis not present

## 2023-04-22 LAB — URINALYSIS, ROUTINE W REFLEX MICROSCOPIC
Bilirubin Urine: NEGATIVE
Glucose, UA: NEGATIVE mg/dL
Ketones, ur: NEGATIVE mg/dL
Nitrite: NEGATIVE
Protein, ur: 100 mg/dL — AB
Specific Gravity, Urine: 1.017 (ref 1.005–1.030)
pH: 5 (ref 5.0–8.0)

## 2023-04-22 LAB — SODIUM, URINE, RANDOM: Sodium, Ur: <10 mmol/L

## 2023-04-22 LAB — BASIC METABOLIC PANEL
Anion gap: 14 (ref 5–15)
Anion gap: 16 — ABNORMAL HIGH (ref 5–15)
BUN: 74 mg/dL — ABNORMAL HIGH (ref 8–23)
BUN: 78 mg/dL — ABNORMAL HIGH (ref 8–23)
CO2: 17 mmol/L — ABNORMAL LOW (ref 22–32)
CO2: 16 mmol/L — ABNORMAL LOW (ref 22–32)
Calcium: 8.3 mg/dL — ABNORMAL LOW (ref 8.9–10.3)
Calcium: 8.4 mg/dL — ABNORMAL LOW (ref 8.9–10.3)
Chloride: 91 mmol/L — ABNORMAL LOW (ref 98–111)
Chloride: 91 mmol/L — ABNORMAL LOW (ref 98–111)
Creatinine, Ser: 5.16 mg/dL — ABNORMAL HIGH (ref 0.61–1.24)
Creatinine, Ser: 5.09 mg/dL — ABNORMAL HIGH (ref 0.61–1.24)
GFR, Estimated: 12 mL/min — ABNORMAL LOW (ref >60)
GFR, Estimated: 12 mL/min — ABNORMAL LOW (ref >60)
Glucose, Bld: 130 mg/dL — ABNORMAL HIGH (ref 70–99)
Glucose, Bld: 143 mg/dL — ABNORMAL HIGH (ref 70–99)
Potassium: 4.7 mmol/L (ref 3.5–5.1)
Potassium: 5 mmol/L (ref 3.5–5.1)
Sodium: 122 mmol/L — ABNORMAL LOW (ref 135–145)
Sodium: 123 mmol/L — ABNORMAL LOW (ref 135–145)

## 2023-04-22 LAB — GLUCOSE, CAPILLARY
Glucose-Capillary: 136 mg/dL — ABNORMAL HIGH (ref 70–99)
Glucose-Capillary: 117 mg/dL — ABNORMAL HIGH (ref 70–99)
Glucose-Capillary: 122 mg/dL — ABNORMAL HIGH (ref 70–99)
Glucose-Capillary: 134 mg/dL — ABNORMAL HIGH (ref 70–99)

## 2023-04-22 LAB — OSMOLALITY, URINE: Osmolality, Ur: 329 mosm/kg (ref 300–900)

## 2023-04-22 MED ORDER — MIDODRINE HCL 5 MG PO TABS
5.0000 mg | ORAL_TABLET | Freq: Three times a day (TID) | ORAL | Status: DC
  Administered 2023-04-22 – 2023-04-25 (×10): 5 mg via ORAL
  Filled 2023-04-22 (×9): qty 1

## 2023-04-22 MED ORDER — MIDODRINE HCL 5 MG PO TABS: 5.0000 mg | ORAL_TABLET | Freq: Three times a day (TID) | ORAL | Status: DC

## 2023-04-22 MED ORDER — FUROSEMIDE 10 MG/ML IJ SOLN
120.0000 mg | Freq: Once | INTRAVENOUS | Status: AC
  Administered 2023-04-22: 120 mg via INTRAVENOUS
  Filled 2023-04-22: qty 10

## 2023-04-22 MED ORDER — POLYETHYLENE GLYCOL 3350 17 G PO PACK
17.0000 g | PACK | Freq: Every day | ORAL | Status: DC
  Administered 2023-04-22 – 2023-04-23 (×2): 17 g via ORAL
  Filled 2023-04-22 (×2): qty 1

## 2023-04-22 MED ORDER — HYDROMORPHONE HCL 2 MG PO TABS
2.0000 mg | ORAL_TABLET | ORAL | Status: DC | PRN
  Administered 2023-04-22 – 2023-04-27 (×25): 2 mg via ORAL
  Filled 2023-04-22 (×25): qty 1

## 2023-04-22 NOTE — Progress Notes (Signed)
4 Days Post-Op Procedure(s) (LRB): CORONARY ARTERY BYPASS GRAFTING (CABG) TIMES FOUR USING LEFT INTERNAL MAMMARY ARTERY AND ENDOSCOPICALLY HARVESTED LEFT GREATER SAPHENOUS VEIN (N/A) CLIPPING OF ATRIAL APPENDAGE USING ATRICURE ATRICLIP [id number] with pulmonary vein isolation ablation (N/A) TRANSESOPHAGEAL ECHOCARDIOGRAM (TEE) (N/A) Subjective: Ambulated already this AM Pain about baseline  Objective: Vital signs in last 24 hours: Temp:  [97.6 F (36.4 C)-98.2 F (36.8 C)] 98 F (36.7 C) (01/18 0700) Pulse Rate:  [60-77] 67 (01/18 0700) Cardiac Rhythm: Atrial fibrillation (01/17 2200) Resp:  [10-25] 14 (01/18 0700) BP: (97-144)/(61-96) 134/92 (01/18 0700) SpO2:  [90 %-99 %] 97 % (01/18 0700) Weight:  [90.9 kg] 90.9 kg (01/18 0500)  Hemodynamic parameters for last 24 hours:    Intake/Output from previous day: 01/17 0701 - 01/18 0700 In: 10 [I.V.:10] Out: 340 [Urine:340] Intake/Output this shift: No intake/output data recorded.  General appearance: alert, cooperative, and no distress Neurologic: intact Heart: regular rate and rhythm Lungs: diminished breath sounds bibasilar Abdomen: normal findings: soft, non-tender Extremities: edema +++  Lab Results: Recent Labs    04/20/23 0207 04/21/23 0323  WBC 16.5* 12.9*  HGB 10.2* 9.3*  HCT 29.6* 27.5*  PLT 106* 111*   BMET:  Recent Labs    04/21/23 0323 04/22/23 0401  NA 126* 122*  K 4.4 4.7  CL 94* 91*  CO2 19* 17*  GLUCOSE 128* 130*  BUN 56* 74*  CREATININE 4.27* 5.16*  CALCIUM 8.5* 8.3*    PT/INR: No results for input(s): "LABPROT", "INR" in the last 72 hours. ABG    Component Value Date/Time   PHART 7.345 (L) 04/19/2023 1652   HCO3 19.3 (L) 04/19/2023 1652   TCO2 20 (L) 04/19/2023 1652   ACIDBASEDEF 6.0 (H) 04/19/2023 1652   O2SAT 98 04/19/2023 1652   CBG (last 3)  Recent Labs    04/21/23 1545 04/21/23 2209 04/22/23 0646  GLUCAP 178* 222* 136*    Assessment/Plan: S/P Procedure(s)  (LRB): CORONARY ARTERY BYPASS GRAFTING (CABG) TIMES FOUR USING LEFT INTERNAL MAMMARY ARTERY AND ENDOSCOPICALLY HARVESTED LEFT GREATER SAPHENOUS VEIN (N/A) CLIPPING OF ATRIAL APPENDAGE USING ATRICURE ATRICLIP [id number] with pulmonary vein isolation ablation (N/A) TRANSESOPHAGEAL ECHOCARDIOGRAM (TEE) (N/A) POD # 4 NEURO- intact CV in rate controlled A fib  On PO amiodarone  Start DOAC prior to DC RESP_ continue IS RENAL_ AKI with oliguria  Creatinine up from 4.3 to 5.2  Nephrology following  [place]- no obstruction  Hyponatremia, K normal ENDO-CBG elevated  On SemGlee+ SSI Gi tolerating diet Continue cardiac rehab  LOS: 12 days    David [name] 04/22/2023

## 2023-04-22 NOTE — Progress Notes (Signed)
   Patient Name: David [name] Date of Encounter: 04/22/2023 [facility] Cardiologist: Charlton Haws, MD   Interval Summary  .    Creatinine continues to rise Nephrology now following Feels congested but well  Vital Signs .    Vitals:   04/22/23 0400 04/22/23 0500 04/22/23 0600 04/22/23 0700  BP: (!) 135/96  (!) 138/94 (!) 134/92  Pulse: 65 67 75 67  Resp: 15 (!) 25 14 14   Temp:    98 F (36.7 C)  TempSrc:    Oral  SpO2: 96% 99% 97% 97%  Weight:  90.9 kg    Height:        Intake/Output Summary (Last 24 hours) at 04/22/2023 0810 Last data filed at 04/22/2023 0700 Gross per 24 hour  Intake 10 ml  Output 340 ml  Net -330 ml      04/22/2023    5:00 AM 04/21/2023    5:30 AM 04/20/2023    5:00 AM  Last 3 Weights  Weight (lbs) 200 lb 6.4 oz 193 lb 5.5 oz 193 lb 5.5 oz  Weight (kg) 90.9 kg 87.7 kg 87.7 kg      Telemetry/ECG   AF  - Personally Reviewed  Physical Exam .    GEN: NAD, AO x 3 Neck: RIJ sheath Cardiac: Irregular RR, no murmurs, rubs, or gallops.  Respiratory: Clear GI: Soft, nontender, non-distended  MS: No edema  Assessment & Plan .     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  - eliquis when able; if unable he is s/p LAA ligation   Heart failure with recovered EF Goal to return at least Coreg and SGLT2i, no digoxin at DC   Hypertension - BP is recoverinv today, goal to avoid hypotension due to AKI; if BP continues to rise, return coreg 6.25 mg PO BID    Permanent atrial fibrillation - on eliquis and PO amiodarone  Hypercholesterolemia Continue Crestor 40, Zetia   Type 2 DM Post surgical AKI/ATN - if GFR recovers, peri-discharge SGLT2i would be appropriate - nephrology note consulted  Chart Check tomorrow   For questions or updates, please contact [facility] Please consult [email or web address] for contact info under   Riley Lam, MD FASE [facility] Cardiologist [facility]  [address] [place], [address] [place], [place] [address] [phone]  8:10 AM

## 2023-04-22 NOTE — Progress Notes (Signed)
      [address]       [place] [address]             [phone]      Comfortable  BP (!) 141/91   Pulse 75   Temp 98.4 F (36.9 C) (Oral)   Resp 16   Ht 6' (1.829 m)   Wt 90.9 kg   SpO2 98%   BMI 27.18 kg/m    Intake/Output Summary (Last 24 hours) at 04/22/2023 1745 Last data filed at 04/22/2023 1700 Gross per 24 hour  Intake 483.97 ml  Output 1140 ml  Net -656.03 ml   800 ml of UO since 7AM- with Lasix  BP in 140 systolic range- decrease midodrine to 5 mg TID, stop tomorrow if remains hypertensive  Viviann Spare C. Dorris Fetch, MD [facility] [phone]

## 2023-04-22 NOTE — Progress Notes (Signed)
Nephrology Follow-Up Consult note   Assessment/Recommendations: [name] David is a/an 63 y.o. male with a past medical history significant for HFmrEF, Atrial fibrillation, HTN, DM 2 Who presents with shortness of breath now status post CABG, Now complicated by AKI   Oliguric AKI on CKD 3 8: Baseline creatinine around 1.4-1.7.  Rising creatinine since surgery likely ATN after surgery possible cholesterol embolic injury.  Given low urine sodium also likely has some degree of cardiorenal syndrome contributing.  No uremic symptoms at this time -IV Lasix challenge today -Follow-up C3 -Hoping for recovery with time -Continue to monitor daily Cr, Dose meds for GFR -Monitor Daily I/Os, Daily weight  -Maintain MAP>65 for optimal renal perfusion.  -Avoid nephrotoxic medications including NSAIDs -Use synthetic opioids (Fentanyl/Dilaudid) if needed -Currently no indication for HD   NSTEMI: Status post CABG on 1/14.  Cardiology and surgery following   Atrial fibrillation: Eliquis on discharge   HLD: On Crestor   DM 2: Management per primary team   Chronic pain: Management per primary team   Heart failure: Ejection fraction normal during surgery.  Continue to monitor volume status   Hyponatremia: Likely in the setting of AKI with free water retention.  Sodium fairly low today at 122.  Urine sodium low.  Will try IV diuresis as above.  Consider repeating Lasix.  Repeat BMP this afternoon   Metabolic acidosis: Bicarb slightly decreased.  Continue monitor without therapy   Anemia: Likely multifactorial.  No ESA for now.  Transfusions if needed   Recommendations conveyed to primary service.    Darnell [name] [facility] 04/22/2023 8:34 AM  ___________________________________________________________  CC: shortness of breath  Interval History/Subjective: Feeling better today.  No significant nausea or vomiting.  Dizziness improved.  Moderate chest pain.  No significant  shortness of breath   Medications:  Current Facility-Administered Medications  Medication Dose Route Frequency Provider Last Rate Last Admin   acetaminophen (TYLENOL) tablet 1,000 mg  1,000 mg Oral Q6H Roddenberry, Myron G, PA-C   1,000 mg at 04/21/23 1755   Or   acetaminophen (TYLENOL) 160 MG/5ML solution 1,000 mg  1,000 mg Per Tube Q6H Roddenberry, Myron G, PA-C       amiodarone (PACERONE) tablet 200 mg  200 mg Oral BID Alleen Borne, MD   200 mg at 04/21/23 2109   aspirin EC tablet 325 mg  325 mg Oral Daily Leary Roca, PA-C   325 mg at 04/21/23 8119   Or   aspirin chewable tablet 324 mg  324 mg Per Tube Daily Roddenberry, Myron G, PA-C       bisacodyl (DULCOLAX) EC tablet 10 mg  10 mg Oral Daily Roddenberry, Myron G, PA-C   10 mg at 04/21/23 1478   Or   bisacodyl (DULCOLAX) suppository 10 mg  10 mg Rectal Daily Roddenberry, Myron G, PA-C       Chlorhexidine Gluconate Cloth 2 % PADS 6 each  6 each Topical Daily Alleen Borne, MD   6 each at 04/21/23 1135   dextrose 50 % solution 0-50 mL  0-50 mL Intravenous PRN Roddenberry, Myron G, PA-C       docusate sodium (COLACE) capsule 200 mg  200 mg Oral Daily Roddenberry, Myron G, PA-C   200 mg at 04/21/23 0910   doxycycline (VIBRA-TABS) tablet 100 mg  100 mg Oral Q12H Alleen Borne, MD   100 mg at 04/21/23 2109   ezetimibe (ZETIA) tablet 10 mg  10 mg Oral Daily  Leary Roca, PA-C   10 mg at 04/21/23 0911   fluticasone (FLONASE) 50 MCG/ACT nasal spray 2 spray  2 spray Each Nare Daily Roddenberry, Myron G, PA-C       furosemide (LASIX) 120 mg in dextrose 5 % 50 mL IVPB  120 mg Intravenous Once Darnell Level, MD 62 mL/hr at 04/22/23 0804 120 mg at 04/22/23 0804   gabapentin (NEURONTIN) capsule 200 mg  200 mg Oral TID Alleen Borne, MD   200 mg at 04/21/23 2109   heparin injection 5,000 Units  5,000 Units Subcutaneous Q8H Alleen Borne, MD   5,000 Units at 04/22/23 0540   HYDROmorphone (DILAUDID) injection 0.5 mg  0.5  mg Intravenous Q2H PRN Alleen Borne, MD   0.5 mg at 04/21/23 2313   insulin aspart (novoLOG) injection 0-24 Units  0-24 Units Subcutaneous TID WC Alleen Borne, MD   2 Units at 04/22/23 0800   insulin glargine-yfgn (SEMGLEE) injection 25 Units  25 Units Subcutaneous Daily Alleen Borne, MD   25 Units at 04/21/23 1123   midodrine (PROAMATINE) tablet 10 mg  10 mg Oral TID WC Alleen Borne, MD   10 mg at 04/22/23 0759   ondansetron (ZOFRAN) injection 4 mg  4 mg Intravenous Q6H PRN Roddenberry, Myron G, PA-C       Oral care mouth rinse  15 mL Mouth Rinse PRN Alleen Borne, MD       oxyCODONE (Oxy IR/ROXICODONE) immediate release tablet 5-10 mg  5-10 mg Oral Q3H PRN Roddenberry, Myron G, PA-C   10 mg at 04/22/23 0806   pantoprazole (PROTONIX) EC tablet 40 mg  40 mg Oral Daily Roddenberry, Myron G, PA-C   40 mg at 04/21/23 0911   rosuvastatin (CRESTOR) tablet 40 mg  40 mg Oral Daily Orbie Pyo, MD   40 mg at 04/21/23 0910   sodium chloride flush (NS) 0.9 % injection 10-40 mL  10-40 mL Intracatheter Q12H Alleen Borne, MD   10 mL at 04/21/23 2109   sodium chloride flush (NS) 0.9 % injection 10-40 mL  10-40 mL Intracatheter PRN Alleen Borne, MD       sodium chloride flush (NS) 0.9 % injection 3 mL  3 mL Intravenous Q12H Roddenberry, Myron G, PA-C   3 mL at 04/21/23 2110   sodium chloride flush (NS) 0.9 % injection 3 mL  3 mL Intravenous PRN Roddenberry, Myron G, PA-C       sodium chloride flush (NS) 0.9 % injection 3-10 mL  3-10 mL Intravenous Q12H Roddenberry, Myron G, PA-C   10 mL at 04/21/23 2110   sodium chloride flush (NS) 0.9 % injection 3-10 mL  3-10 mL Intravenous PRN Roddenberry, Myron G, PA-C       traMADol (ULTRAM) tablet 50 mg  50 mg Oral Q12H PRN Alleen Borne, MD   50 mg at 04/21/23 1213      Review of Systems: 10 systems reviewed and negative except per interval history/subjective  Physical Exam: Vitals:   04/22/23 0600 04/22/23 0700  BP: (!) 138/94 (!) 134/92   Pulse: 75 67  Resp: 14 14  Temp:  98 F (36.7 C)  SpO2: 97% 97%   No intake/output data recorded.  Intake/Output Summary (Last 24 hours) at 04/22/2023 0834 Last data filed at 04/22/2023 0700 Gross per 24 hour  Intake 10 ml  Output 340 ml  Net -330 ml   Constitutional: well-appearing, no acute distress ENMT:  ears and nose without scars or lesions, MMM CV: normal rate, trace edema to bilateral ankles Respiratory: Bilateral chest rise, normal work of breathing Gastrointestinal: soft, non-tender, moderate distention Skin: no visible lesions or rashes Psych: alert, judgement/insight appropriate, appropriate mood and affect   Test Results I personally reviewed new and old clinical labs and radiology tests Lab Results  Component Value Date   NA 122 (L) 04/22/2023   K 4.7 04/22/2023   CL 91 (L) 04/22/2023   CO2 17 (L) 04/22/2023   BUN 74 (H) 04/22/2023   CREATININE 5.16 (H) 04/22/2023   CALCIUM 8.3 (L) 04/22/2023   ALBUMIN 3.5 04/09/2023   PHOS 4.0 08/31/2022    CBC Recent Labs  Lab 04/19/23 1657 04/20/23 0207 04/21/23 0323  WBC 15.3* 16.5* 12.9*  HGB 10.3* 10.2* 9.3*  HCT 30.6* 29.6* 27.5*  MCV 96.2 96.1 96.2  PLT 102* 106* 111*

## 2023-04-23 LAB — RENAL FUNCTION PANEL
Albumin: 2.6 g/dL — ABNORMAL LOW (ref 3.5–5.0)
Anion gap: 12 (ref 5–15)
BUN: 81 mg/dL — ABNORMAL HIGH (ref 8–23)
CO2: 18 mmol/L — ABNORMAL LOW (ref 22–32)
Calcium: 8.2 mg/dL — ABNORMAL LOW (ref 8.9–10.3)
Chloride: 91 mmol/L — ABNORMAL LOW (ref 98–111)
Creatinine, Ser: 4.73 mg/dL — ABNORMAL HIGH (ref 0.61–1.24)
GFR, Estimated: 13 mL/min — ABNORMAL LOW (ref >60)
Glucose, Bld: 129 mg/dL — ABNORMAL HIGH (ref 70–99)
Phosphorus: 7 mg/dL — ABNORMAL HIGH (ref 2.5–4.6)
Potassium: 4.4 mmol/L (ref 3.5–5.1)
Sodium: 121 mmol/L — ABNORMAL LOW (ref 135–145)

## 2023-04-23 LAB — BASIC METABOLIC PANEL
Anion gap: 13 (ref 5–15)
BUN: 80 mg/dL — ABNORMAL HIGH (ref 8–23)
CO2: 18 mmol/L — ABNORMAL LOW (ref 22–32)
Calcium: 8.4 mg/dL — ABNORMAL LOW (ref 8.9–10.3)
Chloride: 93 mmol/L — ABNORMAL LOW (ref 98–111)
Creatinine, Ser: 4.47 mg/dL — ABNORMAL HIGH (ref 0.61–1.24)
GFR, Estimated: 14 mL/min — ABNORMAL LOW (ref >60)
Glucose, Bld: 146 mg/dL — ABNORMAL HIGH (ref 70–99)
Potassium: 4.2 mmol/L (ref 3.5–5.1)
Sodium: 124 mmol/L — ABNORMAL LOW (ref 135–145)

## 2023-04-23 LAB — GLUCOSE, CAPILLARY
Glucose-Capillary: 127 mg/dL — ABNORMAL HIGH (ref 70–99)
Glucose-Capillary: 139 mg/dL — ABNORMAL HIGH (ref 70–99)
Glucose-Capillary: 136 mg/dL — ABNORMAL HIGH (ref 70–99)
Glucose-Capillary: 110 mg/dL — ABNORMAL HIGH (ref 70–99)

## 2023-04-23 LAB — C3 COMPLEMENT: C3 Complement: 117 mg/dL (ref 82–167)

## 2023-04-23 MED ORDER — LACTULOSE 10 GM/15ML PO SOLN
20.0000 g | Freq: Once | ORAL | Status: AC
Start: 2023-04-23 — End: 2023-04-23
  Administered 2023-04-23: 20 g via ORAL
  Filled 2023-04-23: qty 30

## 2023-04-23 MED ORDER — FUROSEMIDE 10 MG/ML IJ SOLN
120.0000 mg | Freq: Two times a day (BID) | INTRAVENOUS | Status: AC
Start: 2023-04-23 — End: 2023-04-23
  Administered 2023-04-23 (×2): 120 mg via INTRAVENOUS
  Filled 2023-04-23 (×2): qty 10

## 2023-04-23 NOTE — Progress Notes (Signed)
5 Days Post-Op Procedure(s) (LRB): CORONARY ARTERY BYPASS GRAFTING (CABG) TIMES FOUR USING LEFT INTERNAL MAMMARY ARTERY AND ENDOSCOPICALLY HARVESTED LEFT GREATER SAPHENOUS VEIN (N/A) CLIPPING OF ATRIAL APPENDAGE USING ATRICURE ATRICLIP [id number] with pulmonary vein isolation ablation (N/A) TRANSESOPHAGEAL ECHOCARDIOGRAM (TEE) (N/A) Subjective: C/o incisional pain  Objective: Vital signs in last 24 hours: Temp:  [98.2 F (36.8 C)-98.4 F (36.9 C)] 98.2 F (36.8 C) (01/19 0400) Pulse Rate:  [65-83] 83 (01/19 0600) Cardiac Rhythm: Atrial fibrillation (01/19 0600) Resp:  [9-23] 15 (01/19 0600) BP: (84-159)/(66-108) 144/89 (01/19 0600) SpO2:  [90 %-100 %] 100 % (01/19 0600) Weight:  [90.7 kg] 90.7 kg (01/19 0500)  Hemodynamic parameters for last 24 hours:    Intake/Output from previous day: 01/18 0701 - 01/19 0700 In: 1094 [P.O.:970; IV Piggyback:124] Out: 1920 [Urine:1920] Intake/Output this shift: No intake/output data recorded.  General appearance: alert, cooperative, and no distress Neurologic: intact Heart: irregularly irregular rhythm Lungs: diminished breath sounds bibasilar Abdomen: mildly distended, nontender  Lab Results: Recent Labs    04/21/23 0323  WBC 12.9*  HGB 9.3*  HCT 27.5*  PLT 111*   BMET:  Recent Labs    04/22/23 1252 04/23/23 0555  NA 123* 121*  K 5.0 4.4  CL 91* 91*  CO2 16* 18*  GLUCOSE 143* 129*  BUN 78* 81*  CREATININE 5.09* 4.73*  CALCIUM 8.4* 8.2*    PT/INR: No results for input(s): "LABPROT", "INR" in the last 72 hours. ABG    Component Value Date/Time   PHART 7.345 (L) 04/19/2023 1652   HCO3 19.3 (L) 04/19/2023 1652   TCO2 20 (L) 04/19/2023 1652   ACIDBASEDEF 6.0 (H) 04/19/2023 1652   O2SAT 98 04/19/2023 1652   CBG (last 3)  Recent Labs    04/22/23 1548 04/22/23 2218 04/23/23 0625  GLUCAP 122* 134* 127*    Assessment/Plan: S/P Procedure(s) (LRB): CORONARY ARTERY BYPASS GRAFTING (CABG) TIMES FOUR USING LEFT INTERNAL  MAMMARY ARTERY AND ENDOSCOPICALLY HARVESTED LEFT GREATER SAPHENOUS VEIN (N/A) CLIPPING OF ATRIAL APPENDAGE USING ATRICURE ATRICLIP [id number] with pulmonary vein isolation ablation (N/A) TRANSESOPHAGEAL ECHOCARDIOGRAM (TEE) (N/A) POD # 5 NEURO- intact CV- in A fib with controlled VR  On PO amiodarone  Resume Eliquis PTD, has LAA clip RESP- continue IS RENAL-AKi with oliguric ARF  Improved response to diuretics and creatinine down slightly, continue IV Lasix  ENDO- CBG well controlled GI-tolerating diet  No BM yet- lactulose  LOS: 13 days    Loreli Slot 04/23/2023

## 2023-04-23 NOTE — Progress Notes (Signed)
Nephrology Follow-Up Consult note   Assessment/Recommendations: David [name] is a/an 63 y.o. male with a past medical history significant for HFmrEF, Atrial fibrillation, HTN, DM 2 Who presents with shortness of breath now status post CABG, Now complicated by AKI   Oliguric AKI on CKD 3 8: Baseline creatinine around 1.4-1.7.  Rising creatinine since surgery likely ATN after surgery possible cholesterol embolic injury.  Given low urine sodium also likely has some degree of cardiorenal syndrome contributing.  No uremic symptoms at this time. Responding to lasix. -Cont with IV lasix 120mg  BID today; reorder tomorrow or increase regimen if needed given volume excess today -Follow-up C3 -Hoping for recovery with time -Continue to monitor daily Cr, Dose meds for GFR -Monitor Daily I/Os, Daily weight  -Maintain MAP>65 for optimal renal perfusion.  -Avoid nephrotoxic medications including NSAIDs -Use synthetic opioids (Fentanyl/Dilaudid) if needed -Currently no indication for HD   NSTEMI: Status post CABG on 1/14.  Cardiology and surgery following   Atrial fibrillation: Eliquis on discharge   HLD: On Crestor   DM 2: Management per primary team   Chronic pain: Management per primary team   Heart failure: Ejection fraction normal during surgery.  Diuretics as above   Hyponatremia: Likely in the setting of AKI with free water retention.  Urine sodium low. Serum sodium remains low. He is asymptomatic. Continue with lasix as above and repeat labs this afternoon   Metabolic acidosis: Bicarb slightly decreased.  Continue monitor without therapy   Anemia: Likely multifactorial.  No ESA for now.  Transfusions if needed   Recommendations conveyed to primary service.    [name] 04/23/2023 7:46 AM  ___________________________________________________________  CC: shortness of breath  Interval History/Subjective: Rough night due to chest pain. Some SOB. No  n/v. UOP picking up with lasix.   Medications:  Current Facility-Administered Medications  Medication Dose Route Frequency Provider Last Rate Last Admin   acetaminophen (TYLENOL) tablet 1,000 mg  1,000 mg Oral Q6H Roddenberry, Myron G, PA-C   1,000 mg at 04/23/23 1610   Or   acetaminophen (TYLENOL) 160 MG/5ML solution 1,000 mg  1,000 mg Per Tube Q6H Roddenberry, Myron G, PA-C       amiodarone (PACERONE) tablet 200 mg  200 mg Oral BID Alleen Borne, MD   200 mg at 04/22/23 2139   aspirin EC tablet 325 mg  325 mg Oral Daily Leary Roca, PA-C   325 mg at 04/22/23 9604   Or   aspirin chewable tablet 324 mg  324 mg Per Tube Daily Roddenberry, Myron G, PA-C       bisacodyl (DULCOLAX) EC tablet 10 mg  10 mg Oral Daily Roddenberry, Myron G, PA-C   10 mg at 04/22/23 5409   Or   bisacodyl (DULCOLAX) suppository 10 mg  10 mg Rectal Daily Roddenberry, Myron G, PA-C       Chlorhexidine Gluconate Cloth 2 % PADS 6 each  6 each Topical Daily Alleen Borne, MD   6 each at 04/22/23 0918   dextrose 50 % solution 0-50 mL  0-50 mL Intravenous PRN Roddenberry, Myron G, PA-C       docusate sodium (COLACE) capsule 200 mg  200 mg Oral Daily Roddenberry, Myron G, PA-C   200 mg at 04/22/23 0918   doxycycline (VIBRA-TABS) tablet 100 mg  100 mg Oral Q12H Alleen Borne, MD   100 mg at 04/22/23 2139   ezetimibe (ZETIA) tablet 10 mg  10 mg  Oral Daily Leary Roca, PA-C   10 mg at 04/22/23 0918   fluticasone (FLONASE) 50 MCG/ACT nasal spray 2 spray  2 spray Each Nare Daily Roddenberry, Myron G, PA-C       furosemide (LASIX) 120 mg in dextrose 5 % 50 mL IVPB  120 mg Intravenous BID David Level, MD       gabapentin (NEURONTIN) capsule 200 mg  200 mg Oral TID Alleen Borne, MD   200 mg at 04/22/23 2139   heparin injection 5,000 Units  5,000 Units Subcutaneous Q8H Alleen Borne, MD   5,000 Units at 04/23/23 7253   HYDROmorphone (DILAUDID) injection 0.5 mg  0.5 mg Intravenous Q2H PRN Alleen Borne, MD   0.5 mg at 04/22/23 2147   HYDROmorphone (DILAUDID) tablet 2 mg  2 mg Oral Q4H PRN Loreli Slot, MD   2 mg at 04/23/23 6644   insulin aspart (novoLOG) injection 0-24 Units  0-24 Units Subcutaneous TID WC Alleen Borne, MD   2 Units at 04/22/23 1714   insulin glargine-yfgn (SEMGLEE) injection 25 Units  25 Units Subcutaneous Daily Alleen Borne, MD   25 Units at 04/22/23 0918   midodrine (PROAMATINE) tablet 5 mg  5 mg Oral TID WC Alleen Borne, MD   5 mg at 04/22/23 1816   ondansetron (ZOFRAN) injection 4 mg  4 mg Intravenous Q6H PRN Roddenberry, Myron G, PA-C       Oral care mouth rinse  15 mL Mouth Rinse PRN Alleen Borne, MD       pantoprazole (PROTONIX) EC tablet 40 mg  40 mg Oral Daily Roddenberry, Myron G, PA-C   40 mg at 04/22/23 0347   polyethylene glycol (MIRALAX / GLYCOLAX) packet 17 g  17 g Oral Daily Lorin Glass, MD   17 g at 04/22/23 1506   rosuvastatin (CRESTOR) tablet 40 mg  40 mg Oral Daily Orbie Pyo, MD   40 mg at 04/22/23 4259   sodium chloride flush (NS) 0.9 % injection 10-40 mL  10-40 mL Intracatheter Q12H Alleen Borne, MD   10 mL at 04/22/23 0919   sodium chloride flush (NS) 0.9 % injection 10-40 mL  10-40 mL Intracatheter PRN Alleen Borne, MD       sodium chloride flush (NS) 0.9 % injection 3 mL  3 mL Intravenous Q12H Roddenberry, Myron G, PA-C   3 mL at 04/22/23 2140   sodium chloride flush (NS) 0.9 % injection 3 mL  3 mL Intravenous PRN Roddenberry, Myron G, PA-C       sodium chloride flush (NS) 0.9 % injection 3-10 mL  3-10 mL Intravenous Q12H Roddenberry, Myron G, PA-C   10 mL at 04/21/23 2110   sodium chloride flush (NS) 0.9 % injection 3-10 mL  3-10 mL Intravenous PRN Leary Roca, PA-C          Review of Systems: 10 systems reviewed and negative except per interval history/subjective  Physical Exam: Vitals:   04/23/23 0500 04/23/23 0600  BP: (!) 84/70 (!) 144/89  Pulse: 83 83  Resp: 16 15  Temp:    SpO2:  100% 100%   No intake/output data recorded.  Intake/Output Summary (Last 24 hours) at 04/23/2023 0746 Last data filed at 04/23/2023 0600 Gross per 24 hour  Intake 1093.97 ml  Output 1920 ml  Net -826.03 ml   Constitutional: well-appearing, no acute distress ENMT: ears and nose without scars or lesions, MMM  CV: normal rate, `+ edema to bilateral ankles Respiratory: Bilateral chest rise, normal work of breathing Gastrointestinal: soft, non-tender, moderate distention Skin: no visible lesions or rashes Psych: alert, judgement/insight appropriate, appropriate mood and affect   Test Results I personally reviewed new and old clinical labs and radiology tests Lab Results  Component Value Date   NA 121 (L) 04/23/2023   K 4.4 04/23/2023   CL 91 (L) 04/23/2023   CO2 18 (L) 04/23/2023   BUN 81 (H) 04/23/2023   CREATININE 4.73 (H) 04/23/2023   CALCIUM 8.2 (L) 04/23/2023   ALBUMIN 2.6 (L) 04/23/2023   PHOS 7.0 (H) 04/23/2023    CBC Recent Labs  Lab 04/19/23 1657 04/20/23 0207 04/21/23 0323  WBC 15.3* 16.5* 12.9*  HGB 10.3* 10.2* 9.3*  HCT 30.6* 29.6* 27.5*  MCV 96.2 96.1 96.2  PLT 102* 106* 111*

## 2023-04-23 NOTE — Progress Notes (Signed)
      [address]       [place] [address]             [phone]      POD # 5   No new issues  BP (!) 149/91   Pulse 76   Temp 98.2 F (36.8 C) (Oral)   Resp 13   Ht 6' (1.829 m)   Wt 90.7 kg   SpO2 (!) 87%   BMI 27.12 kg/m  Rate controlled Afib  Intake/Output Summary (Last 24 hours) at 04/23/2023 1731 Last data filed at 04/23/2023 1655 Gross per 24 hour  Intake 911.96 ml  Output 3170 ml  Net -2258.04 ml   AKI- nonoliguric phase, responding well to Lasix  Viviann Spare C. Dorris Fetch, MD [facility] [phone]

## 2023-04-24 ENCOUNTER — Inpatient Hospital Stay (HOSPITAL_COMMUNITY): Payer: Medicaid Other

## 2023-04-24 LAB — RENAL FUNCTION PANEL
Albumin: 2.8 g/dL — ABNORMAL LOW (ref 3.5–5.0)
Anion gap: 15 (ref 5–15)
BUN: 82 mg/dL — ABNORMAL HIGH (ref 8–23)
CO2: 19 mmol/L — ABNORMAL LOW (ref 22–32)
Calcium: 8.5 mg/dL — ABNORMAL LOW (ref 8.9–10.3)
Chloride: 94 mmol/L — ABNORMAL LOW (ref 98–111)
Creatinine, Ser: 4.11 mg/dL — ABNORMAL HIGH (ref 0.61–1.24)
GFR, Estimated: 16 mL/min — ABNORMAL LOW (ref >60)
Glucose, Bld: 121 mg/dL — ABNORMAL HIGH (ref 70–99)
Phosphorus: 6.3 mg/dL — ABNORMAL HIGH (ref 2.5–4.6)
Potassium: 3.9 mmol/L (ref 3.5–5.1)
Sodium: 128 mmol/L — ABNORMAL LOW (ref 135–145)

## 2023-04-24 LAB — GLUCOSE, CAPILLARY
Glucose-Capillary: 125 mg/dL — ABNORMAL HIGH (ref 70–99)
Glucose-Capillary: 172 mg/dL — ABNORMAL HIGH (ref 70–99)
Glucose-Capillary: 167 mg/dL — ABNORMAL HIGH (ref 70–99)
Glucose-Capillary: 111 mg/dL — ABNORMAL HIGH (ref 70–99)

## 2023-04-24 MED ORDER — POLYETHYLENE GLYCOL 3350 17 G PO PACK
17.0000 g | PACK | Freq: Two times a day (BID) | ORAL | Status: DC
  Administered 2023-04-24 – 2023-05-02 (×12): 17 g via ORAL
  Filled 2023-04-24 (×15): qty 1

## 2023-04-24 MED ORDER — APIXABAN 5 MG PO TABS
5.0000 mg | ORAL_TABLET | Freq: Two times a day (BID) | ORAL | Status: DC
  Administered 2023-04-24 – 2023-05-02 (×17): 5 mg via ORAL
  Filled 2023-04-24 (×17): qty 1

## 2023-04-24 MED ORDER — LACTULOSE 10 GM/15ML PO SOLN
20.0000 g | Freq: Once | ORAL | Status: AC
  Administered 2023-04-24: 20 g via ORAL
  Filled 2023-04-24: qty 30

## 2023-04-24 MED ORDER — FUROSEMIDE 10 MG/ML IJ SOLN
120.0000 mg | Freq: Two times a day (BID) | INTRAVENOUS | Status: AC
  Administered 2023-04-24 (×2): 120 mg via INTRAVENOUS
  Filled 2023-04-24 (×2): qty 10

## 2023-04-24 MED ORDER — DOCUSATE SODIUM 100 MG PO CAPS
200.0000 mg | ORAL_CAPSULE | Freq: Two times a day (BID) | ORAL | Status: DC
  Administered 2023-04-24 – 2023-04-28 (×8): 200 mg via ORAL
  Administered 2023-04-29: 100 mg via ORAL
  Administered 2023-04-29 – 2023-05-02 (×6): 200 mg via ORAL
  Filled 2023-04-24 (×16): qty 2

## 2023-04-24 MED ORDER — ASPIRIN 81 MG PO TBEC
81.0000 mg | DELAYED_RELEASE_TABLET | Freq: Every day | ORAL | Status: DC
  Administered 2023-04-24 – 2023-05-01 (×8): 81 mg via ORAL
  Filled 2023-04-24 (×8): qty 1

## 2023-04-24 MED ORDER — POTASSIUM CHLORIDE CRYS ER 20 MEQ PO TBCR
20.0000 meq | EXTENDED_RELEASE_TABLET | Freq: Once | ORAL | Status: AC
  Administered 2023-04-24: 20 meq via ORAL
  Filled 2023-04-24: qty 1

## 2023-04-24 MED ORDER — ASPIRIN 81 MG PO CHEW
81.0000 mg | CHEWABLE_TABLET | Freq: Every day | ORAL | Status: DC
  Filled 2023-04-24: qty 1

## 2023-04-24 MED ORDER — SENNA 8.6 MG PO TABS
2.0000 | ORAL_TABLET | Freq: Two times a day (BID) | ORAL | Status: DC
  Administered 2023-04-24 – 2023-04-29 (×9): 17.2 mg via ORAL
  Administered 2023-04-29: 8.6 mg via ORAL
  Administered 2023-04-30 – 2023-05-02 (×4): 17.2 mg via ORAL
  Filled 2023-04-24 (×14): qty 2

## 2023-04-24 MED ORDER — SENNOSIDES-DOCUSATE SODIUM 8.6-50 MG PO TABS: 1.0000 | ORAL_TABLET | Freq: Every day | ORAL | Status: DC

## 2023-04-24 NOTE — [place] FL2 (Cosign Needed)
[place] MEDICAID FL2 LEVEL OF CARE FORM     IDENTIFICATION  Patient Name: David [name] Birthdate: Apr 26, 1960 Sex: male Admission Date (Current Location): 04/09/2023  [facility] and [place] Number:  Producer, television/film/video and Address:  [facility], [address] [place], [place] [address]      Provider Number: [id number]  Attending Physician Name and Address:  Alleen Borne, MD  Relative Name and Phone Number:  Nigel Bridgeman (Spouse) [phone]    Current Level of Care: Hospital Recommended Level of Care: Skilled Nursing Facility Prior Approval Number:    Date Approved/Denied:   PASRR Number: [id number]  Discharge Plan: SNF    Current Diagnoses: 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   Bradycardia 04/14/2023   NSTEMI (non-ST elevated myocardial infarction) (HCC) 04/13/2023   Chronic bilateral low back pain 04/13/2023   Coronary artery disease involving coronary bypass graft of native heart with unstable angina pectoris (HCC) 04/12/2023   Acute decompensated heart failure (HCC) 04/10/2023   Chronic pain syndrome 04/09/2023   MRSA infection greater than 3 months ago 04/09/2023   Hypoglycemia 10/06/2022   Pleural effusion 09/29/2022   Chronic systolic CHF (congestive heart failure) (HCC) 09/29/2022   Acute urinary retention 09/29/2022   Anxiety 09/29/2022   Hardware complicating wound infection (HCC) 09/25/2022   Discitis of thoracic region 09/25/2022   Hypokalemia 09/07/2022   Acute toxic encephalopathy 09/07/2022   Acute postoperative anemia due to expected blood loss 09/07/2022   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   Adjustment disorder with mixed anxiety and depressed mood 08/13/2022   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 08/08/2022   MRSA bacteremia 08/08/2022   AKI (acute kidney injury), ruled out 07/27/2022   Discitis thoracic region 07/27/2022   Bacteremia due to methicillin resistant  Staphylococcus aureus 07/27/2022   Severe protein-calorie malnutrition (HCC) 07/26/2022   PVD (peripheral vascular disease) (HCC) 07/26/2022   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   Bacteremia 07/15/2022   Atelectasis 07/12/2022   Sepsis due to pneumonia (HCC) 07/12/2022   Acute on chronic diastolic (congestive) heart failure (HCC) 07/12/2022   Gangrene of toe of right foot (HCC)    Sepsis (HCC) 08/29/2021   SIRS (systemic inflammatory response syndrome) (HCC) 08/29/2021   Hyponatremia 08/29/2021   Hypomagnesemia 08/29/2021   Pure hypercholesterolemia 06/17/2021   Pain of left hand 09/06/2019   Cellulitis 08/26/2019   Persistent atrial fibrillation (HCC) 08/26/2019   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) 10/18/2018   Transaminitis 10/17/2018   Hyperbilirubinemia 10/17/2018   Leukocytosis 10/17/2018   Sinusitis 10/17/2018   Chronic anticoagulation 09/30/2017   Smoker 09/30/2017   Cardiomyopathy (HCC) 09/30/2017   Sleep apnea suspected 09/30/2017   Primary hypertension 09/26/2017   Type 2 diabetes mellitus (HCC) 09/26/2017   Atrial fibrillation (HCC) 09/26/2017    Orientation RESPIRATION BLADDER Height & Weight     Self, Time, Situation, Place  Normal Continent (Urethral Catheter) Weight: 197 lb 5 oz (89.5 kg) Height:  6' (182.9 cm)  BEHAVIORAL SYMPTOMS/MOOD NEUROLOGICAL BOWEL NUTRITION STATUS      Continent Diet (Please see discharge summary)  AMBULATORY STATUS COMMUNICATION OF NEEDS Skin   Extensive Assist Verbally Other (Comment) (Blister,Ecchymosis,leg,Wound/Incision LDAs,Incision closed,chest,other,Incision closed,Leg,Left)                       Personal Care Assistance Level of Assistance  Bathing, Feeding, Dressing Bathing Assistance: Maximum assistance Feeding assistance: Independent Dressing Assistance: Maximum  assistance     Functional Limitations Info  Sight, Hearing, Speech Sight Info: Adequate Hearing Info: Adequate Speech Info:  Adequate    SPECIAL CARE FACTORS FREQUENCY  PT (By licensed PT), OT (By licensed OT)     PT Frequency: 5x min weekly OT Frequency: 5x min weekly            Contractures Contractures Info: Not present    Additional Factors Info  Code Status, Allergies, Insulin Sliding Scale Code Status Info: FULL Allergies Info: NKA   Insulin Sliding Scale Info: insulin aspart (novoLOG) injection 0-24 Units 3 times daily with meals,  insulin glargine-yfgn (SEMGLEE) injection 25 Units daily       Current Medications (04/24/2023):  This is the current hospital active medication list Current Facility-Administered Medications  Medication Dose Route Frequency Provider Last Rate Last Admin   amiodarone (PACERONE) tablet 200 mg  200 mg Oral BID Alleen Borne, MD   200 mg at 04/24/23 1124   apixaban (ELIQUIS) tablet 5 mg  5 mg Oral BID Gardner Candle, RPH   5 mg at 04/24/23 1123   aspirin EC tablet 81 mg  81 mg Oral Daily Alleen Borne, MD   81 mg at 04/24/23 1124   Or   aspirin chewable tablet 81 mg  81 mg Per Tube Daily Alleen Borne, MD       bisacodyl (DULCOLAX) EC tablet 10 mg  10 mg Oral Daily Roddenberry, Myron G, PA-C   10 mg at 04/24/23 1124   Or   bisacodyl (DULCOLAX) suppository 10 mg  10 mg Rectal Daily Roddenberry, Myron G, PA-C       Chlorhexidine Gluconate Cloth 2 % PADS 6 each  6 each Topical Daily Alleen Borne, MD   6 each at 04/24/23 1300   dextrose 50 % solution 0-50 mL  0-50 mL Intravenous PRN Roddenberry, Myron G, PA-C       docusate sodium (COLACE) capsule 200 mg  200 mg Oral BID Wilmer Floor, RPH   200 mg at 04/24/23 1124   doxycycline (VIBRA-TABS) tablet 100 mg  100 mg Oral Q12H Alleen Borne, MD   100 mg at 04/24/23 1124   ezetimibe (ZETIA) tablet 10 mg  10 mg Oral Daily Roddenberry, Myron G, PA-C   10 mg at 04/24/23 1124   fluticasone (FLONASE) 50 MCG/ACT nasal spray 2 spray  2 spray Each Nare Daily Roddenberry, Myron G, PA-C       furosemide (LASIX) 120 mg in  dextrose 5 % 50 mL IVPB  120 mg Intravenous Q12H Ethelene Hal, MD   Stopped at 04/24/23 1233   gabapentin (NEURONTIN) capsule 200 mg  200 mg Oral TID Alleen Borne, MD   200 mg at 04/24/23 1503   HYDROmorphone (DILAUDID) tablet 2 mg  2 mg Oral Q4H PRN Loreli Slot, MD   2 mg at 04/24/23 1525   insulin aspart (novoLOG) injection 0-24 Units  0-24 Units Subcutaneous TID WC Alleen Borne, MD   4 Units at 04/24/23 1756   insulin glargine-yfgn (SEMGLEE) injection 25 Units  25 Units Subcutaneous Daily Alleen Borne, MD   25 Units at 04/24/23 1125   midodrine (PROAMATINE) tablet 5 mg  5 mg Oral TID WC Alleen Borne, MD   5 mg at 04/24/23 1756   ondansetron (ZOFRAN) injection 4 mg  4 mg Intravenous Q6H PRN Leary Roca, PA-C   4 mg at 04/24/23 5784   Oral  care mouth rinse  15 mL Mouth Rinse PRN Alleen Borne, MD       pantoprazole (PROTONIX) EC tablet 40 mg  40 mg Oral Daily Roddenberry, Myron G, PA-C   40 mg at 04/24/23 1124   polyethylene glycol (MIRALAX / GLYCOLAX) packet 17 g  17 g Oral BID Wilmer Floor, RPH   17 g at 04/24/23 1125   rosuvastatin (CRESTOR) tablet 40 mg  40 mg Oral Daily Orbie Pyo, MD   40 mg at 04/24/23 1124   senna (SENOKOT) tablet 17.2 mg  2 tablet Oral BID Wilmer Floor, RPH   17.2 mg at 04/24/23 1123   sodium chloride flush (NS) 0.9 % injection 10-40 mL  10-40 mL Intracatheter Q12H Alleen Borne, MD   10 mL at 04/24/23 1126   sodium chloride flush (NS) 0.9 % injection 10-40 mL  10-40 mL Intracatheter PRN Alleen Borne, MD       sodium chloride flush (NS) 0.9 % injection 3 mL  3 mL Intravenous Q12H Roddenberry, Myron G, PA-C   3 mL at 04/24/23 1126   sodium chloride flush (NS) 0.9 % injection 3 mL  3 mL Intravenous PRN Roddenberry, Myron G, PA-C       sodium chloride flush (NS) 0.9 % injection 3-10 mL  3-10 mL Intravenous Q12H Roddenberry, Myron G, PA-C   3 mL at 04/23/23 0916   sodium chloride flush (NS) 0.9 % injection 3-10 mL  3-10 mL  Intravenous PRN Leary Roca, PA-C         Discharge Medications: Please see discharge summary for a list of discharge medications.  Relevant Imaging Results:  Relevant Lab Results:   Additional Information [id number]  Delilah Shan, LCSWA

## 2023-04-24 NOTE — Progress Notes (Signed)
PHARMACY - ANTICOAGULATION CONSULT NOTE  Pharmacy Consult for Apixaban dosing Indication: atrial fibrillation  No Known Allergies  Patient Measurements: Height: 6' (182.9 cm) Weight: 89.5 kg (197 lb 5 oz) IBW/kg (Calculated) : 77.6 Heparin Dosing Weight: 89.5 kg  Vital Signs: Temp: 98.6 F (37 C) (01/20 1100) Temp Source: Oral (01/20 1100) BP: 128/93 (01/20 0600) Pulse Rate: 84 (01/20 0600)  Labs: Recent Labs    04/23/23 0555 04/23/23 1306 04/24/23 0429  CREATININE 4.73* 4.47* 4.11*    Estimated Creatinine Clearance: 20.7 mL/min (A) (by C-G formula based on SCr of 4.11 mg/dL (H)).   Medical History: Past Medical History:  Diagnosis Date   Cardiomyopathy (HCC)    a. EF 45% in 2019.   CHF (congestive heart failure) (HCC)    Chronic anticoagulation 09/30/2017   Diabetes mellitus type 2 in nonobese [facility])    Discitis of thoracic region 09/25/2022   Does not have health insurance    Essential hypertension 09/26/2017   Financial difficulties    H/O noncompliance with medical treatment, presenting hazards to health    Hardware complicating wound infection (HCC) 09/25/2022   Non-insulin treated type 2 diabetes mellitus (HCC) 09/26/2017   Persistent atrial fibrillation (HCC)    Sleep apnea suspected 09/30/2017    Medications:  Medications Prior to Admission  Medication Sig Dispense Refill Last Dose/Taking   apixaban (ELIQUIS) 5 MG TABS tablet Take 1 tablet (5 mg total) by mouth 2 (two) times daily. 60 tablet 1 04/09/2023 at  8:00 AM   carvedilol (COREG) 3.125 MG tablet Take 1 tablet (3.125 mg total) by mouth 2 (two) times daily. 180 tablet 1 04/09/2023 at  8:00 AM   dapagliflozin propanediol (FARXIGA) 10 MG TABS tablet Take 1 tablet (10 mg total) by mouth daily before breakfast. 90 tablet 1 04/09/2023 Morning   digoxin (LANOXIN) 0.125 MG tablet Take 1 tablet (125 mcg total) by mouth once daily. 30 tablet 1 04/09/2023 Morning   doxycycline (VIBRA-TABS) 100 MG tablet Take 1  tablet (100 mg total) by mouth every 12 (twelve) hours. 180 tablet 3 04/09/2023 Morning   famotidine (PEPCID) 20 MG tablet Take 1 tablet (20 mg total) by mouth 2 times daily. 60 tablet 1 04/09/2023 Morning   fluticasone (FLONASE) 50 MCG/ACT nasal spray Place 2 sprays into both nostrils daily. 16 g 2 04/09/2023 Morning   furosemide (LASIX) 40 MG tablet Take 1/2 tablet (20 mg total) by mouth daily. 30 tablet 1 04/09/2023 Morning   HYDROcodone-acetaminophen (NORCO) 10-325 MG tablet Take 1 tablet by mouth every 4 (four) hours as needed for pain. 30 tablet 0 Past Week   insulin glargine (LANTUS SOLOSTAR) 100 UNIT/ML Solostar Pen Inject 27 Units into the skin daily. 30 mL 6 04/08/2023 Morning   methocarbamol (ROBAXIN-750) 750 MG tablet Take 1 tablet (750 mg total) by mouth 2 (two) times daily as needed for muscle spasms. 60 tablet 0 04/08/2023 Morning   pregabalin (LYRICA) 75 MG capsule Take 1 capsule (75 mg total) by mouth at bedtime. 30 capsule 5 04/08/2023 Bedtime   rosuvastatin (CRESTOR) 20 MG tablet Take 1 tablet (20 mg total) by mouth daily. 90 tablet 1 04/09/2023 Morning   sacubitril-valsartan (ENTRESTO) 49-51 MG Take 1 tablet by mouth 2 (two) times daily. 60 tablet 1 04/09/2023 Morning   tamsulosin (FLOMAX) 0.4 MG CAPS capsule Take 1 capsule (0.4 mg total) by mouth daily after supper. 90 capsule 1 04/09/2023 Evening   terbinafine (LAMISIL) 250 MG tablet Take 1 tablet (250 mg total) by mouth  daily. 90 tablet 1 04/09/2023 Morning   XTAMPZA ER 9 MG C12A Take 1 capsule by mouth every 12 (twelve) hours.   04/08/2023 Evening   Accu-Chek Softclix Lancets lancets Use to check blood sugar 3 times daily. 100 each 6    Blood Glucose Monitoring Suppl (ACCU-CHEK GUIDE) w/Device KIT Use to check blood sugar 3 times daily. E11.9 1 kit 0    Continuous Glucose Receiver (DEXCOM G7 RECEIVER) DEVI Use as directed. 1 each 0    Continuous Glucose Sensor (DEXCOM G7 SENSOR) MISC Use as directed & change every 10 days. 3 each 12    glucose blood  (ACCU-CHEK GUIDE) test strip Use to check blood sugar 3 times daily. 100 each 6    Insulin Pen Needle (PEN NEEDLES) 32G X 4 MM MISC Use to inject insulin once daily. 100 each 6    Misc. 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, MS, Stroke, Diabetic Neuropathy AFO [employer] (1 Gray-Black) Fax to [employer] : [phone] 1 each 0    naloxone (NARCAN) nasal spray 4 mg/0.1 mL Opiates overdose 2 each 0    Scheduled:   amiodarone  200 mg Oral BID   apixaban  5 mg Oral BID   aspirin EC  81 mg Oral Daily   Or   aspirin  81 mg Per Tube Daily   bisacodyl  10 mg Oral Daily   Or   bisacodyl  10 mg Rectal Daily   Chlorhexidine Gluconate Cloth  6 each Topical Daily   docusate sodium  200 mg Oral BID   doxycycline  100 mg Oral Q12H   ezetimibe  10 mg Oral Daily   fluticasone  2 spray Each Nare Daily   gabapentin  200 mg Oral TID   insulin aspart  0-24 Units Subcutaneous TID WC   insulin glargine-yfgn  25 Units Subcutaneous Daily   midodrine  5 mg Oral TID WC   pantoprazole  40 mg Oral Daily   polyethylene glycol  17 g Oral BID   rosuvastatin  40 mg Oral Daily   senna  2 tablet Oral BID   sodium chloride flush  10-40 mL Intracatheter Q12H   sodium chloride flush  3 mL Intravenous Q12H   sodium chloride flush  3-10 mL Intravenous Q12H   Infusions:   furosemide     PRN: dextrose, HYDROmorphone, ondansetron (ZOFRAN) IV, mouth rinse, sodium chloride flush, sodium chloride flush, sodium chloride flush Anti-infectives (From admission, onward)    Start     Dose/Rate Route Frequency Ordered Stop   04/20/23 1000  doxycycline (VIBRA-TABS) tablet 100 mg        100 mg Oral Every 12 hours 04/20/23 0720     04/18/23 2000  vancomycin (VANCOCIN) IVPB 1000 mg/200 mL premix        1,000 mg 200 mL/hr over 60 Minutes Intravenous  Once 04/18/23 1315 04/18/23 2057   04/18/23 1400  ceFAZolin  (ANCEF) IVPB 2g/100 mL premix        2 g 200 mL/hr over 30 Minutes Intravenous Every 8 hours 04/18/23 1315 04/20/23 0657   04/18/23 0400  Vancomycin (VANCOCIN) 1,500 mg in sodium chloride 0.9 % 500 mL IVPB        1,500 mg 250 mL/hr over 120 Minutes Intravenous To Surgery 04/17/23 1029 04/18/23 0733   04/18/23 0400  ceFAZolin (ANCEF) IVPB 2g/100  mL premix  Status:  Discontinued        2 g 200 mL/hr over 30 Minutes Intravenous To Surgery 04/17/23 1029 04/18/23 1315   04/18/23 0400  ceFAZolin (ANCEF) IVPB 2g/100 mL premix        2 g 200 mL/hr over 30 Minutes Intravenous To Surgery 04/17/23 1029 04/18/23 1201   04/09/23 2200  doxycycline (VIBRA-TABS) tablet 100 mg  Status:  Discontinued        100 mg Oral Every 12 hours 04/09/23 1607 04/18/23 1315       Assessment: Pt is 62 YOM post-CABG following NSTEMI. PTA pt was on Eliquis for atrial fibrillation which has been held while patient is recovering post-op. Will resume Eliquis today. CBC is stable with not signs of bleeding.  Goal of Therapy:  Monitor platelets by anticoagulation protocol: Yes   Plan:  Apixaban 5 mg twice daily Monitor daily CBC and signs of bleeding  Wilmer Floor 04/24/2023,11:27 AM

## 2023-04-24 NOTE — TOC Initial Note (Signed)
Transition of Care [facility]) - Initial/Assessment Note    Patient Details  Name: David [name] MRN: [id number] Date of Birth: Sep 26, 1960  Transition of [facility]) CM/SW Contact:    Delilah Shan, LCSWA Phone Number: 04/24/2023, 4:45 PM  Clinical Narrative:                  CSW received consult for possible SNF placement at time of discharge. CSW spoke with patient at bedside regarding PT recommendation of SNF placement at time of discharge. Patient reports PTA he comes from home with spouse,his cousin, and cousins daughter. Patient expressed understanding of PT recommendation and is agreeable to SNF placement at time of discharge. Patient gave CSW permission to fax out for SNF placement. CSW discussed insurance authorization process and will provide Medicare SNF ratings list with accepted SNF bed offers when available.No further questions reported at this time. CSW to continue to follow and assist with discharge planning needs.   Expected Discharge Plan: Skilled Nursing Facility Barriers to Discharge: Continued Medical Work up   Patient Goals and CMS Choice Patient states their goals for this hospitalization and ongoing recovery are:: SNF CMS Medicare.gov Compare Post Acute Care list provided to:: Patient Choice offered to / list presented to : Patient      Expected Discharge Plan and Services In-house Referral: Clinical Social Work Discharge Planning Services: CM Consult Post Acute Care Choice: NA Living arrangements for the past 2 months: Single Family Home                 DME Arranged: N/A DME Agency: NA       HH Arranged: NA          Prior Living Arrangements/Services Living arrangements for the past 2 months: Single Family Home Lives with:: Spouse, Other (Comment) (Spouse,cousin, and cousins daughter) Patient language and need for interpreter reviewed:: Yes Do you feel safe going back to the place where you live?: No   SNF  Need for Family Participation in Patient  Care: Yes (Comment) Care giver support system in place?: Yes (comment) Current home services:  (OP therapy) Criminal Activity/Legal Involvement Pertinent to Current Situation/Hospitalization: No - Comment as needed  Activities of Daily Living   ADL Screening (condition at time of admission) Independently performs ADLs?: Yes (appropriate for developmental age) Is the patient deaf or have difficulty hearing?: No Does the patient have difficulty seeing, even when wearing glasses/contacts?: No Does the patient have difficulty concentrating, remembering, or making decisions?: No  Permission Sought/Granted Permission sought to share information with : Case Manager, Magazine features editor, Family Supports Permission granted to share information with : Yes, Verbal Permission Granted  Share Information with NAME: [name]  Permission granted to share info w AGENCY: SNF  Permission granted to share info w Relationship: spouse  Permission granted to share info w Contact Information: [name] [phone]  Emotional Assessment Appearance:: Appears stated age Attitude/Demeanor/Rapport: Gracious Affect (typically observed): Calm Orientation: : Oriented to Self, Oriented to Place, Oriented to  Time, Oriented to Situation Alcohol / Substance Use: Not Applicable Psych Involvement: No (comment)  Admission diagnosis:  Persistent atrial fibrillation (HCC) [I48.19] Elevated troponin [R79.89] Acute respiratory failure with hypoxia (HCC) [J96.01] Acute decompensated heart failure (HCC) [I50.9] Acute on chronic congestive heart failure, unspecified heart failure type (HCC) [I50.9] Chronic bilateral low back pain, unspecified whether sciatica present [M54.50, G89.29] S/P CABG x 4 [Z95.1] 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   Bradycardia  04/14/2023   NSTEMI (non-ST elevated myocardial infarction) (HCC) 04/13/2023   Chronic bilateral low back pain  04/13/2023   Coronary artery disease involving coronary bypass graft of native heart with unstable angina pectoris (HCC) 04/12/2023   Acute decompensated heart failure (HCC) 04/10/2023   Chronic pain syndrome 04/09/2023   MRSA infection greater than 3 months ago 04/09/2023   Hypoglycemia 10/06/2022   Pleural effusion 09/29/2022   Chronic systolic CHF (congestive heart failure) (HCC) 09/29/2022   Acute urinary retention 09/29/2022   Anxiety 09/29/2022   Hardware complicating wound infection (HCC) 09/25/2022   Discitis of thoracic region 09/25/2022   Hypokalemia 09/07/2022   Acute toxic encephalopathy 09/07/2022   Acute postoperative anemia due to expected blood loss 09/07/2022   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   Adjustment disorder with mixed anxiety and depressed mood 08/13/2022   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 08/08/2022   MRSA bacteremia 08/08/2022   AKI (acute kidney injury), ruled out 07/27/2022   Discitis thoracic region 07/27/2022   Bacteremia due to methicillin resistant Staphylococcus aureus 07/27/2022   Severe protein-calorie malnutrition (HCC) 07/26/2022   PVD (peripheral vascular disease) (HCC) 07/26/2022   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   Bacteremia 07/15/2022   Atelectasis 07/12/2022   Sepsis due to pneumonia (HCC) 07/12/2022   Acute on chronic diastolic (congestive) heart failure (HCC) 07/12/2022   Gangrene of toe of right foot (HCC)    Sepsis (HCC) 08/29/2021   SIRS (systemic inflammatory response syndrome) (HCC) 08/29/2021   Hyponatremia 08/29/2021   Hypomagnesemia 08/29/2021   Pure hypercholesterolemia 06/17/2021   Pain of left hand 09/06/2019   Cellulitis 08/26/2019   Persistent atrial fibrillation (HCC) 08/26/2019   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 [place] [address] Phone: [phone] Fax: [phone]  [facility] [address] [place] [address] Phone: [phone] Fax: [phone]  [facility] [address] [place] [address] Phone: [phone] Fax: [phone]  [id number] [place], [place] - [address] AT [facility] [address] & [facility] [address] [place] [address] Phone: [phone] Fax: [phone]     Social Drivers of Health (SDOH) Social History: SDOH Screenings   Food Insecurity: Food Insecurity Present (04/17/2023)  Housing: High Risk (04/17/2023)  Transportation Needs: Unmet Transportation Needs (04/17/2023)  Utilities: Not At Risk (04/10/2023)  Alcohol Screen: Low Risk  (03/01/2023)  Depression (PHQ2-9): High Risk (12/29/2022)  Financial Resource Strain: Medium Risk (03/01/2023)  Physical Activity: Insufficiently Active (03/01/2023)  Social Connections: Unknown (03/01/2023)  Stress: Stress Concern Present (03/01/2023)  Tobacco Use: Medium Risk (04/18/2023)   SDOH Interventions: Food Insecurity Interventions: Inpatient TOC, Patient Declined Transportation Interventions: Inpatient TOC, Patient Declined   Readmission Risk Interventions    09/06/2021   12:07 PM  Readmission Risk Prevention Plan  Post Dischage Appt Complete  Medication Screening Complete  Transportation Screening Complete

## 2023-04-24 NOTE — Progress Notes (Signed)
6 Days Post-Op Procedure(s) (LRB): CORONARY ARTERY BYPASS GRAFTING (CABG) TIMES FOUR USING LEFT INTERNAL MAMMARY ARTERY AND ENDOSCOPICALLY HARVESTED LEFT GREATER SAPHENOUS VEIN (N/A) CLIPPING OF ATRIAL APPENDAGE USING ATRICURE ATRICLIP [id number] with pulmonary vein isolation ablation (N/A) TRANSESOPHAGEAL ECHOCARDIOGRAM (TEE) (N/A) Subjective: Doesn't feel as well today. Constipated and has not had BM since surgery. Passing gas. No N/V   Objective: Vital signs in last 24 hours: Temp:  [97.8 F (36.6 C)-98.9 F (37.2 C)] 98.9 F (37.2 C) (01/20 0400) Pulse Rate:  [65-86] 84 (01/20 0600) Cardiac Rhythm: Atrial fibrillation (01/20 0400) Resp:  [10-27] 13 (01/20 0600) BP: (99-150)/(70-104) 128/93 (01/20 0600) SpO2:  [85 %-100 %] 97 % (01/20 0600) Weight:  [89.5 kg] 89.5 kg (01/20 0635)  Hemodynamic parameters for last 24 hours:    Intake/Output from previous day: 01/19 0701 - 01/20 0700 In: 943.9 [P.O.:820; IV Piggyback:123.9] Out: 5050 [Urine:5050] Intake/Output this shift: No intake/output data recorded.  General appearance: alert and cooperative Neurologic: intact Heart: irregularly irregular rhythm Lungs: clear to auscultation bilaterally Abdomen: distended, non-tender, bowel sounds normal Extremities: edema mild Wound: incisions healing well  Lab Results: No results for input(s): "WBC", "HGB", "HCT", "PLT" in the last 72 hours. BMET:  Recent Labs    04/23/23 1306 04/24/23 0429  NA 124* 128*  K 4.2 3.9  CL 93* 94*  CO2 18* 19*  GLUCOSE 146* 121*  BUN 80* 82*  CREATININE 4.47* 4.11*  CALCIUM 8.4* 8.5*    PT/INR: No results for input(s): "LABPROT", "INR" in the last 72 hours. ABG    Component Value Date/Time   PHART 7.345 (L) 04/19/2023 1652   HCO3 19.3 (L) 04/19/2023 1652   TCO2 20 (L) 04/19/2023 1652   ACIDBASEDEF 6.0 (H) 04/19/2023 1652   O2SAT 98 04/19/2023 1652   CBG (last 3)  Recent Labs    04/23/23 1621 04/23/23 2147 04/24/23 0547  GLUCAP 136*  110* 125*    Assessment/Plan: S/P Procedure(s) (LRB): CORONARY ARTERY BYPASS GRAFTING (CABG) TIMES FOUR USING LEFT INTERNAL MAMMARY ARTERY AND ENDOSCOPICALLY HARVESTED LEFT GREATER SAPHENOUS VEIN (N/A) CLIPPING OF ATRIAL APPENDAGE USING ATRICURE ATRICLIP [id number] with pulmonary vein isolation ablation (N/A) TRANSESOPHAGEAL ECHOCARDIOGRAM (TEE) (N/A)  POD 6 Hemodynamically stable  Permanent atrial fib with controlled rate on amio. Resume Eliquis today.  AKI on CKD: recovering with excellent diuresis yesterday after 120 lasix. -4L. Creat down to 4.11. Nephrology following.  Constipation but passing gas and has good BS. Will check KUB. Lactulose again today. He is on chronic narcotics and increased use postop. He had impaction after back surgery last year.  DC sleeve. Keep foley until bowels working to prevent urinary retention.  DM: glucose under good control.  Continue IS, ambulation.  LOS: 14 days    Alleen Borne 04/24/2023

## 2023-04-24 NOTE — Progress Notes (Signed)
Nephrology Follow-Up Consult note   Assessment/Recommendations: David [name] is a/an 63 y.o. male with a past medical history significant for HFmrEF, Atrial fibrillation, HTN, DM 2 Who presents with shortness of breath now status post CABG, Now complicated by AKI   Oliguric AKI on CKD 3 8: Baseline creatinine around 1.4-1.7.  Rising creatinine since surgery likely ATN after surgery possible cholesterol embolic injury.  Given low urine sodium also likely has some degree of cardiorenal syndrome contributing.  No uremic symptoms at this time. Responding to lasix, C3 NL but does not r/o cholesterol emboli.  -Cont with IV lasix 120mg  BID today (great response with  5 L of urine output over the past 24 hours); will reorder again for today as he still has e/o volume excess today.  Serum sodium and renal function improving c/w CRS. -Hoping for recovery with time -Continue to monitor daily Cr, Dose meds for GFR -Monitor Daily I/Os, Daily weight  -Maintain MAP>65 for optimal renal perfusion.  -Avoid nephrotoxic medications including NSAIDs -Use synthetic opioids (Fentanyl/Dilaudid) if needed -Currently no indication for HD   NSTEMI: Status post CABG on 1/14.  Cardiology and surgery following   Atrial fibrillation: Eliquis on discharge   HLD: On Crestor   DM 2: Management per primary team   Chronic pain: Management per primary team   Heart failure: Ejection fraction normal during surgery.  Diuretics as above   Hyponatremia: Likely in the setting of AKI with free water retention.  Urine sodium low. Serum sodium remains low. He is asymptomatic. Continue with lasix as above and repeat labs this afternoon   Metabolic acidosis: Bicarb slightly decreased.  Continue monitor without therapy   Anemia: Likely multifactorial.  No ESA for now.  Transfusions if needed  Interval History/Subjective: Feeling better and believes breathing is more comfortable. No n/v. UOP picking up with  lasix.   Medications:  Current Facility-Administered Medications  Medication Dose Route Frequency Provider Last Rate Last Admin   amiodarone (PACERONE) tablet 200 mg  200 mg Oral BID Alleen Borne, MD   200 mg at 04/23/23 2106   apixaban (ELIQUIS) tablet 5 mg  5 mg Oral BID Carney, Gwenlyn Found, RPH       aspirin EC tablet 81 mg  81 mg Oral Daily Alleen Borne, MD       Or   aspirin chewable tablet 81 mg  81 mg Per Tube Daily Alleen Borne, MD       bisacodyl (DULCOLAX) EC tablet 10 mg  10 mg Oral Daily Roddenberry, Myron G, PA-C   10 mg at 04/23/23 4098   Or   bisacodyl (DULCOLAX) suppository 10 mg  10 mg Rectal Daily Roddenberry, Myron G, PA-C       Chlorhexidine Gluconate Cloth 2 % PADS 6 each  6 each Topical Daily Alleen Borne, MD   6 each at 04/23/23 1530   dextrose 50 % solution 0-50 mL  0-50 mL Intravenous PRN Roddenberry, Myron G, PA-C       docusate sodium (COLACE) capsule 200 mg  200 mg Oral BID Wilmer Floor, RPH       doxycycline (VIBRA-TABS) tablet 100 mg  100 mg Oral Q12H Alleen Borne, MD   100 mg at 04/23/23 2106   ezetimibe (ZETIA) tablet 10 mg  10 mg Oral Daily Roddenberry, Myron G, PA-C   10 mg at 04/23/23 0903   fluticasone (FLONASE) 50 MCG/ACT nasal spray 2 spray  2 spray Each Nare Daily Roddenberry,  Cecille Amsterdam, PA-C       gabapentin (NEURONTIN) capsule 200 mg  200 mg Oral TID Alleen Borne, MD   200 mg at 04/23/23 2106   HYDROmorphone (DILAUDID) tablet 2 mg  2 mg Oral Q4H PRN Loreli Slot, MD   2 mg at 04/24/23 7829   insulin aspart (novoLOG) injection 0-24 Units  0-24 Units Subcutaneous TID WC Alleen Borne, MD   2 Units at 04/23/23 1704   insulin glargine-yfgn (SEMGLEE) injection 25 Units  25 Units Subcutaneous Daily Alleen Borne, MD   25 Units at 04/23/23 0928   lactulose (CHRONULAC) 10 GM/15ML solution 20 g  20 g Oral Once Alleen Borne, MD       midodrine (PROAMATINE) tablet 5 mg  5 mg Oral TID WC Alleen Borne, MD   5 mg at 04/23/23 1704    ondansetron (ZOFRAN) injection 4 mg  4 mg Intravenous Q6H PRN Roddenberry, Myron G, PA-C   4 mg at 04/24/23 5621   Oral care mouth rinse  15 mL Mouth Rinse PRN Alleen Borne, MD       pantoprazole (PROTONIX) EC tablet 40 mg  40 mg Oral Daily Roddenberry, Myron G, PA-C   40 mg at 04/23/23 3086   polyethylene glycol (MIRALAX / GLYCOLAX) packet 17 g  17 g Oral BID Wilmer Floor, RPH       rosuvastatin (CRESTOR) tablet 40 mg  40 mg Oral Daily Orbie Pyo, MD   40 mg at 04/23/23 5784   senna (SENOKOT) tablet 17.2 mg  2 tablet Oral BID Wilmer Floor, [facility]       sodium chloride flush (NS) 0.9 % injection 10-40 mL  10-40 mL Intracatheter Q12H Alleen Borne, MD   10 mL at 04/23/23 2112   sodium chloride flush (NS) 0.9 % injection 10-40 mL  10-40 mL Intracatheter PRN Alleen Borne, MD       sodium chloride flush (NS) 0.9 % injection 3 mL  3 mL Intravenous Q12H Roddenberry, Myron G, PA-C   3 mL at 04/23/23 2112   sodium chloride flush (NS) 0.9 % injection 3 mL  3 mL Intravenous PRN Roddenberry, Myron G, PA-C       sodium chloride flush (NS) 0.9 % injection 3-10 mL  3-10 mL Intravenous Q12H Roddenberry, Myron G, PA-C   3 mL at 04/23/23 0916   sodium chloride flush (NS) 0.9 % injection 3-10 mL  3-10 mL Intravenous PRN Leary Roca, PA-C          Review of Systems: 10 systems reviewed and negative except per interval history/subjective  Physical Exam: Vitals:   04/24/23 0600 04/24/23 0800  BP: (!) 128/93   Pulse: 84   Resp: 13   Temp:  98.4 F (36.9 C)  SpO2: 97%    No intake/output data recorded.  Intake/Output Summary (Last 24 hours) at 04/24/2023 0813 Last data filed at 04/24/2023 0600 Gross per 24 hour  Intake 823.87 ml  Output 5050 ml  Net -4226.13 ml   Constitutional: well-appearing, no acute distress ENMT: ears and nose without scars or lesions, MMM CV: normal rate, `+ edema to bilateral ankles Respiratory: Bilateral chest rise, normal work of  breathing Gastrointestinal: soft, non-tender, moderate distention Skin: no visible lesions or rashes Psych: alert, judgement/insight appropriate, appropriate mood and affect   Test Results I personally reviewed new and old clinical labs and radiology tests Lab Results  Component Value Date   NA 128 (  L) 04/24/2023   K 3.9 04/24/2023   CL 94 (L) 04/24/2023   CO2 19 (L) 04/24/2023   BUN 82 (H) 04/24/2023   CREATININE 4.11 (H) 04/24/2023   CALCIUM 8.5 (L) 04/24/2023   ALBUMIN 2.8 (L) 04/24/2023   PHOS 6.3 (H) 04/24/2023    CBC Recent Labs  Lab 04/19/23 1657 04/20/23 0207 04/21/23 0323  WBC 15.3* 16.5* 12.9*  HGB 10.3* 10.2* 9.3*  HCT 30.6* 29.6* 27.5*  MCV 96.2 96.1 96.2  PLT 102* 106* 111*

## 2023-04-25 LAB — RENAL FUNCTION PANEL
Albumin: 2.9 g/dL — ABNORMAL LOW (ref 3.5–5.0)
Anion gap: 12 (ref 5–15)
BUN: 81 mg/dL — ABNORMAL HIGH (ref 8–23)
CO2: 22 mmol/L (ref 22–32)
Calcium: 8.7 mg/dL — ABNORMAL LOW (ref 8.9–10.3)
Chloride: 95 mmol/L — ABNORMAL LOW (ref 98–111)
Creatinine, Ser: 3.71 mg/dL — ABNORMAL HIGH (ref 0.61–1.24)
GFR, Estimated: 18 mL/min — ABNORMAL LOW (ref >60)
Glucose, Bld: 121 mg/dL — ABNORMAL HIGH (ref 70–99)
Phosphorus: 4.7 mg/dL — ABNORMAL HIGH (ref 2.5–4.6)
Potassium: 4.1 mmol/L (ref 3.5–5.1)
Sodium: 129 mmol/L — ABNORMAL LOW (ref 135–145)

## 2023-04-25 LAB — GLUCOSE, CAPILLARY
Glucose-Capillary: 123 mg/dL — ABNORMAL HIGH (ref 70–99)
Glucose-Capillary: 191 mg/dL — ABNORMAL HIGH (ref 70–99)
Glucose-Capillary: 180 mg/dL — ABNORMAL HIGH (ref 70–99)
Glucose-Capillary: 133 mg/dL — ABNORMAL HIGH (ref 70–99)
Glucose-Capillary: 160 mg/dL — ABNORMAL HIGH (ref 70–99)

## 2023-04-25 MED ORDER — FLEET ENEMA RE ENEM
1.0000 | ENEMA | Freq: Once | RECTAL | Status: AC
  Administered 2023-04-25: 1 via RECTAL
  Filled 2023-04-25: qty 1

## 2023-04-25 MED ORDER — BACITRACIN-NEOMYCIN-POLYMYXIN OINTMENT TUBE
TOPICAL_OINTMENT | CUTANEOUS | Status: DC | PRN
  Filled 2023-04-25: qty 14

## 2023-04-25 MED ORDER — SMOG ENEMA
960.0000 mL | Freq: Once | RECTAL | Status: DC
  Filled 2023-04-25: qty 960

## 2023-04-25 MED ORDER — SMOG ENEMA
960.0000 mL | Freq: Once | RECTAL | Status: AC
  Administered 2023-04-25: 960 mL via RECTAL

## 2023-04-25 MED ORDER — FUROSEMIDE 10 MG/ML IJ SOLN
120.0000 mg | Freq: Two times a day (BID) | INTRAVENOUS | Status: DC
  Administered 2023-04-25: 120 mg via INTRAVENOUS
  Filled 2023-04-25 (×2): qty 12

## 2023-04-25 MED ORDER — METHOCARBAMOL 500 MG PO TABS
750.0000 mg | ORAL_TABLET | Freq: Three times a day (TID) | ORAL | Status: DC | PRN
  Administered 2023-04-25 – 2023-05-02 (×14): 750 mg via ORAL
  Filled 2023-04-25 (×14): qty 2

## 2023-04-25 NOTE — Plan of Care (Signed)
  Problem: Education: Goal: Knowledge of General Education information will improve Description: Including pain rating scale, medication(s)/side effects and non-pharmacologic comfort measures Outcome: Progressing   Problem: Clinical Measurements: Goal: Will remain free from infection Outcome: Progressing Goal: Diagnostic test results will improve Outcome: Progressing Goal: Respiratory complications will improve Outcome: Progressing   Problem: Activity: Goal: Risk for activity intolerance will decrease Outcome: Progressing   Problem: Coping: Goal: Level of anxiety will decrease Outcome: Progressing   Problem: Elimination: Goal: Will not experience complications related to urinary retention Outcome: Progressing   Problem: Safety: Goal: Ability to remain free from injury will improve Outcome: Progressing   Problem: Elimination: Goal: Will not experience complications related to bowel motility Outcome: Not Progressing

## 2023-04-25 NOTE — Progress Notes (Signed)
Nephrology Follow-Up Consult note   Assessment/Recommendations: David [name] is a/an 63 y.o. male with a past medical history significant for HFmrEF, Atrial fibrillation, HTN, DM 2 Who presents with shortness of breath now status post CABG, Now complicated by AKI   Oliguric AKI on CKD 3 8: Baseline creatinine around 1.4-1.7.  Rising creatinine since surgery likely ATN after surgery possible cholesterol embolic injury.  Given low urine sodium also likely has some degree of cardiorenal syndrome contributing.  No uremic symptoms at this time. Responding to lasix, C3 NL but does not r/o cholesterol emboli.  -Cont with IV lasix 120mg  BID today (great response with  5 L/ 3.4L of urine output 24hr periods); will reorder again for today as he still has e/o volume excess today.  Serum sodium and renal function continues to improve c/w CRS.  Patient is net -13 L during this hospitalization.  -Hoping for recovery with time -Continue to monitor daily Cr, Dose meds for GFR -Monitor Daily I/Os, Daily weight  -Maintain MAP>65 for optimal renal perfusion.  -Avoid nephrotoxic medications including NSAIDs -Use synthetic opioids (Fentanyl/Dilaudid) if needed -Currently no indication for HD   NSTEMI: Status post CABG on 1/14.  Cardiology and surgery following   Atrial fibrillation: Eliquis on discharge   HLD: On Crestor   DM 2: Management per primary team   Chronic pain: Management per primary team   Heart failure: Ejection fraction normal during surgery.  Diuretics as above   Hyponatremia: Likely in the setting of AKI with free water retention.  Urine sodium low. Serum sodium remains low. He is asymptomatic. Continue with lasix as above and repeat labs this afternoon   Metabolic acidosis: Bicarb slightly decreased.  Continue monitor without therapy   Anemia: Likely multifactorial.  No ESA for now.  Transfusions if needed  Interval History/Subjective: Feeling better and believes breathing is more  comfortable. No n/v. UOP picking up with lasix.  Main complaint is constipation   Medications:  Current Facility-Administered Medications  Medication Dose Route Frequency Provider Last Rate Last Admin   amiodarone (PACERONE) tablet 200 mg  200 mg Oral BID Alleen Borne, MD   200 mg at 04/25/23 0936   apixaban (ELIQUIS) tablet 5 mg  5 mg Oral BID Gardner Candle, RPH   5 mg at 04/25/23 1610   aspirin EC tablet 81 mg  81 mg Oral Daily Alleen Borne, MD   81 mg at 04/25/23 9604   Or   aspirin chewable tablet 81 mg  81 mg Per Tube Daily Alleen Borne, MD       bisacodyl (DULCOLAX) EC tablet 10 mg  10 mg Oral Daily Roddenberry, Myron G, PA-C   10 mg at 04/25/23 5409   Or   bisacodyl (DULCOLAX) suppository 10 mg  10 mg Rectal Daily Roddenberry, Myron G, PA-C       Chlorhexidine Gluconate Cloth 2 % PADS 6 each  6 each Topical Daily Alleen Borne, MD   6 each at 04/25/23 0938   dextrose 50 % solution 0-50 mL  0-50 mL Intravenous PRN Roddenberry, Myron G, PA-C       docusate sodium (COLACE) capsule 200 mg  200 mg Oral BID Wilmer Floor, RPH   200 mg at 04/25/23 8119   doxycycline (VIBRA-TABS) tablet 100 mg  100 mg Oral Q12H Alleen Borne, MD   100 mg at 04/25/23 1478   ezetimibe (ZETIA) tablet 10 mg  10 mg Oral Daily Leary Roca, PA-C  10 mg at 04/25/23 0936   fluticasone (FLONASE) 50 MCG/ACT nasal spray 2 spray  2 spray Each Nare Daily Leary Roca, PA-C   2 spray at 04/25/23 1610   gabapentin (NEURONTIN) capsule 200 mg  200 mg Oral TID Alleen Borne, MD   200 mg at 04/25/23 0936   HYDROmorphone (DILAUDID) tablet 2 mg  2 mg Oral Q4H PRN Loreli Slot, MD   2 mg at 04/25/23 0936   insulin aspart (novoLOG) injection 0-24 Units  0-24 Units Subcutaneous TID WC Alleen Borne, MD   4 Units at 04/25/23 9604   insulin glargine-yfgn (SEMGLEE) injection 25 Units  25 Units Subcutaneous Daily Alleen Borne, MD   25 Units at 04/25/23 5409   methocarbamol (ROBAXIN)  tablet 750 mg  750 mg Oral Q8H PRN Alleen Borne, MD       midodrine (PROAMATINE) tablet 5 mg  5 mg Oral TID WC Alleen Borne, MD   5 mg at 04/25/23 0935   ondansetron (ZOFRAN) injection 4 mg  4 mg Intravenous Q6H PRN Roddenberry, Myron G, PA-C   4 mg at 04/24/23 8119   Oral care mouth rinse  15 mL Mouth Rinse PRN Alleen Borne, MD       pantoprazole (PROTONIX) EC tablet 40 mg  40 mg Oral Daily Leary Roca, PA-C   40 mg at 04/25/23 1478   polyethylene glycol (MIRALAX / GLYCOLAX) packet 17 g  17 g Oral BID Wilmer Floor, RPH   17 g at 04/25/23 2956   rosuvastatin (CRESTOR) tablet 40 mg  40 mg Oral Daily Orbie Pyo, MD   40 mg at 04/25/23 2130   senna (SENOKOT) tablet 17.2 mg  2 tablet Oral BID Wilmer Floor, RPH   17.2 mg at 04/25/23 0936   sodium chloride flush (NS) 0.9 % injection 10-40 mL  10-40 mL Intracatheter Q12H Alleen Borne, MD   10 mL at 04/25/23 0938   sodium chloride flush (NS) 0.9 % injection 10-40 mL  10-40 mL Intracatheter PRN Alleen Borne, MD       sodium chloride flush (NS) 0.9 % injection 3 mL  3 mL Intravenous Q12H Roddenberry, Myron G, PA-C   3 mL at 04/25/23 8657   sodium chloride flush (NS) 0.9 % injection 3 mL  3 mL Intravenous PRN Roddenberry, Myron G, PA-C       sodium chloride flush (NS) 0.9 % injection 3-10 mL  3-10 mL Intravenous Q12H Roddenberry, Myron G, PA-C   3 mL at 04/25/23 8469   sodium chloride flush (NS) 0.9 % injection 3-10 mL  3-10 mL Intravenous PRN Leary Roca, PA-C          Review of Systems: 10 systems reviewed and negative except per interval history/subjective  Physical Exam: Vitals:   04/25/23 0900 04/25/23 1000  BP: 111/71 132/79  Pulse: 77 78  Resp: 12 12  Temp:    SpO2: 99% 95%   Total I/O In: -  Out: 210 [Urine:210]  Intake/Output Summary (Last 24 hours) at 04/25/2023 1059 Last data filed at 04/25/2023 0800 Gross per 24 hour  Intake 123.96 ml  Output 3310 ml  Net -3186.04 ml   Constitutional:  well-appearing, no acute distress ENMT: ears and nose without scars or lesions, MMM CV: normal rate, `+ edema to bilateral ankles Respiratory: Bilateral chest rise, normal work of breathing Gastrointestinal: soft, non-tender, moderate distention Skin: no visible lesions or rashes Psych: alert, judgement/insight  appropriate, appropriate mood and affect   Test Results I personally reviewed new and old clinical labs and radiology tests Lab Results  Component Value Date   NA 129 (L) 04/25/2023   K 4.1 04/25/2023   CL 95 (L) 04/25/2023   CO2 22 04/25/2023   BUN 81 (H) 04/25/2023   CREATININE 3.71 (H) 04/25/2023   CALCIUM 8.7 (L) 04/25/2023   ALBUMIN 2.9 (L) 04/25/2023   PHOS 4.7 (H) 04/25/2023    CBC Recent Labs  Lab 04/19/23 1657 04/20/23 0207 04/21/23 0323  WBC 15.3* 16.5* 12.9*  HGB 10.3* 10.2* 9.3*  HCT 30.6* 29.6* 27.5*  MCV 96.2 96.1 96.2  PLT 102* 106* 111*

## 2023-04-25 NOTE — TOC Progression Note (Signed)
Transition of Care [facility]) - Progression Note    Patient Details  Name: BRIGHT KILLE MRN: [id number] Date of Birth: [birth year]  [facility] CM/SW Contact  Delilah Shan, LCSWA Phone Number: 04/25/2023, 2:46 PM  Clinical Narrative:     CSW received SNF bed offer for patient. CSW will follow up with patient with SNF bed offer. CSW will continue to follow.  Expected Discharge Plan: Skilled Nursing Facility Barriers to Discharge: Continued Medical Work up  Expected Discharge Plan and Services In-house Referral: Clinical Social Work Discharge Planning Services: CM Consult Post Acute Care Choice: NA Living arrangements for the past 2 months: Single Family Home                 DME Arranged: N/A DME Agency: NA       HH Arranged: NA           Social Determinants of Health (SDOH) Interventions SDOH Screenings   Food Insecurity: Food Insecurity Present (04/17/2023)  Housing: High Risk (04/17/2023)  Transportation Needs: Unmet Transportation Needs (04/17/2023)  Utilities: Not At Risk (04/10/2023)  Alcohol Screen: Low Risk  (03/01/2023)  Depression (PHQ2-9): High Risk (12/29/2022)  Financial Resource Strain: Medium Risk (03/01/2023)  Physical Activity: Insufficiently Active (03/01/2023)  Social Connections: Unknown (03/01/2023)  Stress: Stress Concern Present (03/01/2023)  Tobacco Use: Medium Risk (04/18/2023)    Readmission Risk Interventions    09/06/2021   12:07 PM  Readmission Risk Prevention Plan  Post Dischage Appt Complete  Medication Screening Complete  Transportation Screening Complete

## 2023-04-25 NOTE — Progress Notes (Signed)
Physical Therapy Treatment Patient Details Name: David [name] MRN: [id number] DOB: 12-31-1960 Today's Date: 04/25/2023   History of Present Illness Patient is 63 y.o. male who presented with dyspnea and is admitted for acute heart failure exacerbation. Pt now s/p CABGx1 performed on 04/18/23. Of note pt had extended hospital stay from 08/20/22-10/28/22 during which he underwent surgical repair 08/30/22: T5-11 posterior/posterolateral arthrodesis  & Transpedicular decompression with partial corpectomy, left T8 & T8-9 laminectomy, bilateral facetectomy. 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 Since discharge in May 2024 pt has represented to ED for back pain x3 and for fall x1.    PT Comments  Pt with c/o of "major issues with constipation" today. Pt with abdominal gas pains and upon standing began to leak stool inhibiting ability to ambulate at this time. Per patient he ambulated earlier this morning. Pt overall much improved from ability to mobilize compared to last week however limited this date due to need to have BM and being transferred to [facility]. Acute PT to cont to follow.    If plan is discharge home, recommend the following: Assist for transportation;Help with stairs or ramp for entrance;A lot of help with walking and/or transfers   Can travel by private vehicle        Equipment Recommendations  Other (comment) (defer to post acute)    Recommendations for Other Services       Precautions / Restrictions Precautions Precautions: Fall;Sternal Precaution Booklet Issued: No Precaution Comments: verbal cues to adhere functionally Restrictions Weight Bearing Restrictions Per Provider Order: Yes RUE Weight Bearing Per Provider Order: Non weight bearing LUE Weight Bearing Per Provider Order:  Non weight bearing Other Position/Activity Restrictions: sternal precautions     Mobility  Bed Mobility Overal bed mobility: Needs Assistance Bed Mobility: Rolling, Sidelying to Sit Rolling: Min assist Sidelying to sit: Min assist       General bed mobility comments: HOB elevated, maximal directional verbal cues, minA for trunk elevation    Transfers Overall transfer level: Needs assistance Equipment used: None Transfers: Sit to/from Stand Sit to Stand: Mod assist, +2 safety/equipment   Step pivot transfers: Mod assist       General transfer comment: upon first stand pt leaking stool from recent enima, pt returned to sitting EOB then completed another STS with step pvt to [facility] with modA, pt with wide base of support and short step height and length, groaning from abdominal pain    Ambulation/Gait               General Gait Details: unable this date due to having to use [facility]   Stairs             Wheelchair Mobility     Tilt Bed    Modified Rankin (Stroke Patients Only)       Balance Overall balance assessment: Needs assistance Sitting-balance support: No upper extremity supported, Feet supported Sitting balance-Leahy Scale: Fair     Standing balance support: Single extremity supported, Bilateral upper extremity supported, During functional activity, Reliant on assistive device for balance Standing balance-Leahy Scale: Poor Standing balance comment: Min A to prevent falls. Pt is very unsteady during dynamic activities.  Cognition Arousal: Alert Behavior During Therapy: WFL for tasks assessed/performed Overall Cognitive Status: No family/caregiver present to determine baseline cognitive functioning                                 General Comments: Pt has poor insight into deficits and difficulty following sternal precautions. Pt with depressed mood and reports "I can't deal with this anymore"         Exercises      General Comments General comments (skin integrity, edema, etc.): VSS      Pertinent Vitals/Pain Pain Assessment Pain Assessment: 0-10 Pain Score: 10-Worst pain ever Pain Location: gas pains/abdomin Pain Descriptors / Indicators: Grimacing, Guarding, Discomfort    Home Living                          Prior Function            PT Goals (current goals can now be found in the care plan section) Acute Rehab PT Goals PT Goal Formulation: With patient Time For Goal Achievement: 05/03/23 Potential to Achieve Goals: Good Progress towards PT goals: Progressing toward goals    Frequency    Min 1X/week      PT Plan      Co-evaluation              AM-PAC PT "6 Clicks" Mobility   Outcome Measure  Help needed turning from your back to your side while in a flat bed without using bedrails?: A Little Help needed moving from lying on your back to sitting on the side of a flat bed without using bedrails?: A Lot Help needed moving to and from a bed to a chair (including a wheelchair)?: A Lot Help needed standing up from a chair using your arms (e.g., wheelchair or bedside chair)?: A Lot Help needed to walk in hospital room?: A Lot Help needed climbing 3-5 steps with a railing? : Total 6 Click Score: 12    End of Session   Activity Tolerance: Other (comment) (limited by need to have BM) Patient left: with call bell/phone within reach (on [facility]) Nurse Communication: Mobility status PT Visit Diagnosis: Unsteadiness on feet (R26.81);Difficulty in walking, not elsewhere classified (R26.2);Other symptoms and signs involving the nervous system (R29.898);Pain Pain - part of body:  (back)     Time: 8119-1478 PT Time Calculation (min) (ACUTE ONLY): 23 min  Charges:    $Therapeutic Activity: 23-37 mins PT General Charges $$ ACUTE PT VISIT: 1 Visit                     Lewis Shock, PT, DPT Acute Rehabilitation Services Secure chat  preferred Office #: [phone]    Iona Hansen 04/25/2023, 11:39 AM

## 2023-04-25 NOTE — Progress Notes (Signed)
Patient ID: David [name], male   DOB: 01-04-1961, 63 y.o.   MRN: [id number] TCTS Evening Rounds  Hemodynamically stable.   Had Fleets enema this am with minimal hard stool. SMOG enema ordered this am.  UO ok.

## 2023-04-26 LAB — GLUCOSE, CAPILLARY
Glucose-Capillary: 212 mg/dL — ABNORMAL HIGH (ref 70–99)
Glucose-Capillary: 261 mg/dL — ABNORMAL HIGH (ref 70–99)
Glucose-Capillary: 158 mg/dL — ABNORMAL HIGH (ref 70–99)
Glucose-Capillary: 173 mg/dL — ABNORMAL HIGH (ref 70–99)
Glucose-Capillary: 195 mg/dL — ABNORMAL HIGH (ref 70–99)

## 2023-04-26 LAB — RENAL FUNCTION PANEL
Albumin: 2.8 g/dL — ABNORMAL LOW (ref 3.5–5.0)
Anion gap: 12 (ref 5–15)
BUN: 72 mg/dL — ABNORMAL HIGH (ref 8–23)
CO2: 22 mmol/L (ref 22–32)
Calcium: 8.9 mg/dL (ref 8.9–10.3)
Chloride: 96 mmol/L — ABNORMAL LOW (ref 98–111)
Creatinine, Ser: 3.12 mg/dL — ABNORMAL HIGH (ref 0.61–1.24)
GFR, Estimated: 22 mL/min — ABNORMAL LOW (ref >60)
Glucose, Bld: 255 mg/dL — ABNORMAL HIGH (ref 70–99)
Phosphorus: 3.8 mg/dL (ref 2.5–4.6)
Potassium: 5.3 mmol/L — ABNORMAL HIGH (ref 3.5–5.1)
Sodium: 130 mmol/L — ABNORMAL LOW (ref 135–145)

## 2023-04-26 MED ORDER — FUROSEMIDE 10 MG/ML IJ SOLN
80.0000 mg | Freq: Two times a day (BID) | INTRAMUSCULAR | Status: DC
  Administered 2023-04-26 – 2023-04-28 (×5): 80 mg via INTRAVENOUS
  Filled 2023-04-26 (×5): qty 8

## 2023-04-26 MED ORDER — ALPRAZOLAM 0.5 MG PO TABS
0.5000 mg | ORAL_TABLET | Freq: Every evening | ORAL | Status: DC
  Administered 2023-04-26 – 2023-05-01 (×6): 0.5 mg via ORAL
  Filled 2023-04-26 (×6): qty 1

## 2023-04-26 MED ORDER — INSULIN GLARGINE-YFGN 100 UNIT/ML ~~LOC~~ SOLN
27.0000 [IU] | Freq: Every day | SUBCUTANEOUS | Status: DC
  Administered 2023-04-27: 27 [IU] via SUBCUTANEOUS
  Filled 2023-04-26 (×2): qty 0.27

## 2023-04-26 NOTE — Discharge Summary (Cosign Needed)
Physician Discharge Summary  Patient ID: David [name] MRN: [id number] DOB/AGE: [birth year] 63 y.o.  Admit date: 04/09/2023 Discharge date: 05/02/2023  Admission Diagnoses:  Acute Systolic Heart Failure NSTEMI Chronic Atrial Fibrillation T2DM Chronic Back Pain Hx of MRSA Bacteremia GERD   Discharge Diagnoses:  Principal Problem:   NSTEMI (non-ST elevated myocardial infarction) (HCC) Active Problems:   Primary hypertension   Type 2 diabetes mellitus (HCC)   Atrial fibrillation (HCC)   AKI (acute kidney injury)   Chronic pain syndrome   MRSA infection greater than 3 months ago   Acute decompensated heart failure (HCC)   Coronary artery disease involving coronary bypass graft of native heart with unstable angina pectoris (HCC)   Acute on chronic diastolic (congestive) heart failure (HCC)   Chronic bilateral low back pain   Bradycardia   S/P CABG x 4   Discharged Condition: stable    History of Present Illness:    We are asked to see for a 63 year old male cardiothoracic surgical consultation for consideration of coronary artery surgical revascularization.  He has multiple cardiac risk factors including a history of congestive heart failure, diabetes mellitus type 2, hypertension, persistent atrial fibrillation with failed previous DCCV, significant peripheral vascular arterial occlusive disease and possible sleep apnea.  Additionally, he is a former smoker but quit approximately 5 years ago.  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  He has a history of extensive fine surgery due to a prior course of MRSA osteomyelitis.  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  He is on doxycycline for chronic infection suppression.  He is on chronic anticoagulation with Eliquis.  His congestive heart failure is previously modified as moderate with an ejection fraction of 45%.  He presented to the emergency room  after approximately 2 days of progressive shortness of breath.  He has no previous history of heart failure exacerbations requiring admission.  He has some degree of orthopnea at baseline.  In the emergency room he was started on a course of diuresis.  He did have elevation of his high-sensitivity troponins ruling in for non-STEMI.  proBNP was also noted to be elevated.  Cardiology consultation was obtained for further evaluation and management.  Of note he did have a echocardiogram done in April 2024 which at that time revealed normal LVEF with regional impaired findings noted in 2020.  He did have acute renal insufficiency on presentation with creatinine peak of 2.28 and it is trending lower with most recent value today 1.62.   Repeat Echocardiogram done on 04/11/2023 showed ejection fraction at 65 to 70% with normal left ventricular function.  Right ventricular systolic function was mildly reduced.  He had mild mitral valvular regurgitation without stenosis.  There were no other valvular abnormalities.  He was medically stabilized and underwent cardiac catheterization on today's date where he was found to have severe three-vessel coronary artery disease.  He has made significant clinical improvement in his pulmonary symptoms since his admission.   David [name] was evaluated by Dr. Laneta Simmers on 04/10/23 who noted:  He presented with worsening shortness of breath and probably having some chest discomfort but vague about that. He ruled in for NSTEMI and echo shows normal LVEF with mild LVH, no significant valvular abnormality. Cath shows diffusely calcified coronary arteries with severe 3V disease. I agree that CABG is the best treatment for him but certainly not straightforward. Hopefully his distal PDA will be  graftable. He has high grade stenoses in the distal large OM but these sub-branches are likely intramyocardial and may not be accessible. His LAD looks graftable but has 50% distal stenosis. His operative risk is  increased due to poorly controlled DM with Hgb A1c of 7.4, stage 3b CKD with creat up to 2.28 this admission, now 1.65. He was 1.28 in October. He has had severe MRSA infection with toe amputations, major spine surgery for discitis and osteomyelitis in May 2024 on chronic doxycycline for suppression. He has been on Eliquis for permanent AF with last dose on Sunday. I would plan on surgery next Tuesday which is our first opening. Plan CABG, pulmonary vein isolation ablation and LAA clip. I discussed the operative procedure with the patient  including alternatives, benefits and risks; including but not limited to bleeding, blood transfusion, infection, stroke, myocardial infarction, graft failure, heart block requiring a permanent pacemaker, organ dysfunction, and death.  David [name] understands and agrees to proceed.     Hospital Course: David [name] remained stable following left heart catheterization.  He was taken to the op room on 12/17/2023 where CABG x 4 placed utilizing a left internal mammary artery to the left anterior descending coronary artery a sequential saphenous vein graft to the first and third obtuse marginal coronary arteries and a separate saphenous vein graft to the posterior descending coronary artery.  Pulmonary vein isolation was also carried out utilizing the Encompass Clamp.  The left atrial appendage was clipped as well.  Following the procedure, he separated from cardiopulmonary bypass without difficulty and was transferred to the surgical ICU in stable condition.  He was extubated during the afternoon on the day of surgery.  Inotropic support was weaned on the first postoperative day as well as vasopressor support.  IV amiodarone was added for ventricular bigeminy.  His diabetes and hyperglycemia were managed initially with an insulin drip that was later transitioned to subcutaneous Levemir and sliding scale insulin on the first postoperative day.  The monitoring lines were removed  routinely on postop day 1 and the patient was mobilized with the assistance of physical therapy.  Patient was evaluated by the inpatient rehab team for potential admission to the [facility] facility.  Mr. Misuraca developed acute on chronic renal insufficiency after surgery.  By the fourth postoperative day, his creatinine was up to 4.27.  He continued to have adequate urine output.  The nephrology team was consulted.  Supportive care continued.  He remained in rate controlled atrial fibrillation.  A renal ultrasound was obtained showing no evidence of obstruction.  By postop day 5, Mr. Vento was beginning to have improved response to diuretics.  The serum creatinine began to decrease.  The atrial fibrillation persisted but with better rate control on oral amiodarone. Renal function gradually improved and he was diuresed with high-dose Lasix with good response. The post-operative ileus also resolved.  Mr. Lemay was ready for transfer to [facility] on post-op day 8.  He was evaluated by the OT and PT teams and it was felt he would benefit from additional rehab at a skilled nursing facility prior to returning home. [facility] team was consulted to assist with placement.  Mr. Reyez remained in atrial fibrillation with controlled rates on amiodarone. Prior to discharge, he was transitioned off the amiodarone and started back on his carvedilol with acceptable rate control.   We continued his doxycycline for chronic infection suppression as he was taking prior to admission. Glucose was  monitored before meals and at bedtime with fair control. Sliding scale insulin coverage was provided when required. He was diuresed after surgery but hiw discharge weight was still about 6kg above the pre-op weight. We will plan to discharge him on Lasix 40mg  daily for 10 days and re-assess his volume status at follow up.  Mr. Laduca requested transition to a skilled nursing facility for further strengthening and rehab  prior to his planned return to home. This was arranged and ha has received insurance authorization for transition to [facility] on this date.  Recommend monitoring with a basic metabolic panel every 3 days while on Lasix.   Consults: nephrology  Significant Diagnostic Studies:  CLINICAL DATA:  Constipation. Reportedly no bowel movement for 7 days.   EXAM: ABDOMEN - 1 VIEW   COMPARISON:  09/14/2022 CT scan   FINDINGS: Posterolateral rod and pedicle screw fixation in the thoracic spine. Prior median sternotomy, CABG, and left atrial appendage clip. Mild enlargement of the cardiopericardial silhouette. Indistinct pulmonary vasculature and interstitial accentuation at the lung bases, cannot exclude interstitial edema.   Gaseous distention of the stomach.   Prominent stool throughout the colon favors constipation. Prominence of stool in the rectal vault, cannot exclude fecal impaction.   Mild degenerative arthropathy of the hips. Rectal temperature probe noted.   IMPRESSION: 1. Prominent stool throughout the colon favors constipation. Prominence of formed stool in the rectal vault, cannot exclude fecal impaction. 2. Mild enlargement of the cardiopericardial silhouette with indistinct pulmonary vasculature and interstitial accentuation at the lung bases, cannot exclude interstitial edema. 3. Gaseous distention of the stomach.     Electronically Signed   By: Gaylyn Rong M.D.   On: 04/24/2023 19:35  Treatments:   CARDIOVASCULAR SURGERY OPERATIVE NOTE   04/18/2023   Surgeon:  Alleen Borne, MD   First Assistant: Jillyn Hidden, PA-C:   An experienced assistant was required given the complexity of this surgery and the standard of surgical care. The assistant was needed for endoscopic vein harvest, exposure, dissection, suctioning, retraction of delicate tissues and sutures, instrument exchange and for overall help during this procedure.      Preoperative Diagnosis:  Severe multi-vessel coronary artery disease     Postoperative Diagnosis:  Same     Procedure:   Median Sternotomy Extracorporeal circulation 3.   Coronary artery bypass grafting x 4   Left internal mammary artery graft to the LAD SVG to PDA Sequential SVG to OM1 and OM3   4.   Endoscopic vein harvest from the left leg 5.   Pulmonary vein isolation ablation using Encompass clamp 6.   Clipping of left atrial appendage   Anesthesia:  General Endotracheal  Discharge Exam: Blood pressure 99/68, pulse 65, temperature 97.7 F (36.5 C), temperature source Oral, resp. rate 19, height 6' (1.829 m), weight 85 kg, SpO2 100%. General appearance: alert, cooperative, and no distress Neurologic: intact Heart: irregularly irregular rhythm, remains in rate-controlled a-fib Lungs: breath sounds CTA, normal WOB on RA.  Abdomen: soft, no tenderness. Extremities: 1-2+ LE edema Wound: the sternotomy incision and left LE EVH incisions are intact and dry.   Disposition:  Discharged to SNF in stable condition Discharge Instructions     Amb Referral to Cardiac Rehabilitation   Complete by: As directed    Diagnosis: CABG   CABG X ___: 4   After initial evaluation and assessments completed: Virtual Based Care may be provided alone or in conjunction with Phase 2 Cardiac Rehab based on  patient barriers.: Yes   Intensive Cardiac Rehabilitation (ICR) [facility] location only OR Traditional Cardiac Rehabilitation (TCR) *If criteria for ICR are not met will enroll in TCR [facility] only): Yes      Allergies as of 05/02/2023   No Known Allergies      Medication List     STOP taking these medications    digoxin 0.125 MG tablet Commonly known as: LANOXIN   Entresto 49-51 MG Generic drug: sacubitril-valsartan   pregabalin 75 MG capsule Commonly known as: LYRICA       TAKE these medications    Accu-Chek Guide test strip Generic drug: glucose blood Use to check blood sugar  3 times daily.   Accu-Chek Guide w/Device Kit Use to check blood sugar 3 times daily. E11.9   Accu-Chek Softclix Lancets lancets Use to check blood sugar 3 times daily.   ALPRAZolam 0.5 MG tablet Commonly known as: XANAX Take 1 tablet (0.5 mg total) by mouth at bedtime as needed for anxiety or sleep.   apixaban 5 MG Tabs tablet Commonly known as: ELIQUIS Take 1 tablet (5 mg total) by mouth 2 (two) times daily.   aspirin EC 81 MG tablet Take 1 tablet (81 mg total) by mouth daily. Swallow whole. Start taking on: May 03, 2023   carvedilol 3.125 MG tablet Commonly known as: COREG Take 1 tablet (3.125 mg total) by mouth 2 (two) times daily.   Dexcom G7 Receiver Devi Use as directed.   Dexcom G7 Sensor Misc Use as directed & change every 10 days.   docusate sodium 100 MG capsule Commonly known as: COLACE Take 2 capsules (200 mg total) by mouth 2 (two) times daily.   doxycycline 100 MG tablet Commonly known as: VIBRA-TABS Take 1 tablet (100 mg total) by mouth every 12 (twelve) hours.   ezetimibe 10 MG tablet Commonly known as: ZETIA Take 1 tablet (10 mg total) by mouth daily. Start taking on: May 03, 2023   famotidine 20 MG tablet Commonly known as: PEPCID Take 1 tablet (20 mg total) by mouth 2 times daily.   Farxiga 10 MG Tabs tablet Generic drug: dapagliflozin propanediol Take 1 tablet (10 mg total) by mouth daily before breakfast.   fluticasone 50 MCG/ACT nasal spray Commonly known as: FLONASE Place 2 sprays into both nostrils daily.   furosemide 40 MG tablet Commonly known as: LASIX Take 1 tablet (40 mg total) by mouth daily. For 10 days. What changed:  how much to take additional instructions   gabapentin 300 MG capsule Commonly known as: NEURONTIN Take 1 capsule (300 mg total) by mouth 3 (three) times daily.   HYDROcodone-acetaminophen 10-325 MG tablet Commonly known as: NORCO Take 1 tablet by mouth every 4 (four) hours as needed for severe  pain (pain score 7-10). What changed: reasons to take this   Insupen Pen Needles 32G X 4 MM Misc Generic drug: Insulin Pen Needle Use to inject insulin once daily.   Lantus SoloStar 100 UNIT/ML Solostar Pen Generic drug: insulin glargine Inject 27 Units into the skin daily.   methocarbamol 750 MG tablet Commonly known as: ROBAXIN Take 1 tablet (750 mg total) by mouth every 8 (eight) hours as needed for muscle spasms. What changed: when to take this   Misc. 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   polyethylene glycol 17 g packet Commonly known as: MIRALAX / GLYCOLAX Take 17 g by mouth 2 (two) times daily.   rosuvastatin 20 MG tablet Commonly known as: Crestor Take 1 tablet (20 mg total) by mouth daily.   senna 8.6 MG Tabs tablet Commonly known as: SENOKOT Take 2 tablets (17.2 mg total) by mouth 2 (two) times daily.   tamsulosin 0.4 MG Caps capsule Commonly known as: FLOMAX Take 1 capsule (0.4 mg total) by mouth daily after supper.   terbinafine 250 MG tablet Commonly known as: LAMISIL Take 1 tablet (250 mg total) by mouth daily.   Xtampza ER 9 MG C12a Generic drug: oxyCODONE ER Take 1 capsule by mouth every 12 (twelve) hours.               Durable Medical Equipment  (From admission, onward)           Start     Ordered   04/11/23 0705  For home use only DME Shower stool  Once        04/11/23 1610            Follow-up Information     Sharlene Dory, PA-C. Go on 05/05/2023.   Specialty: Cardiology Why: Your cardiology follow up  appointment is at 8:25am. Contact information: [address] [place] [address] [phone]         Alleen Borne, MD. Go on 05/24/2023.   Specialty: Cardiothoracic Surgery Why: Your appontment is at 4:30pm. Please obtain a chest x-ray 1 hour before the appointment at [facility] located at [address] Contact information: [address] [employer] [address] [place] [address] [phone]         [facility]. Go on 05/24/2023.   Why: Chest x-ray at 3:30pm Contact information: [address] [place] [address]                The patient has been discharged on:   1.Beta Blocker:  Yes [ x ]                              No   [   ]                              If No, reason:  2.Ace Inhibitor/ARB: Yes [   ]                                     No  [ x  ]                                     If No, reason: Soft BP  3.Statin:   Yes [ x  ]                  No  [   ]  If No, reason:  4.Ecasa:  Yes  [  x]                  No   [   ]                  If No, reason:  5. ACS on Admission? Yes  P2Y12 Inhibitor:  Yes  [   ]                                No  [ x ]  On Eliquis for chronic atrial fibrillation    Signed: Leary Roca, PA-C 05/02/2023, 10:11 AM

## 2023-04-26 NOTE — Plan of Care (Signed)
  Problem: Clinical Measurements: Goal: Will remain free from infection Outcome: Progressing   Problem: Clinical Measurements: Goal: Diagnostic test results will improve Outcome: Progressing   Problem: Clinical Measurements: Goal: Respiratory complications will improve Outcome: Progressing   Problem: Nutrition: Goal: Adequate nutrition will be maintained Outcome: Progressing   Problem: Coping: Goal: Level of anxiety will decrease Outcome: Progressing   Problem: Pain Management: Goal: General experience of comfort will improve Outcome: Progressing

## 2023-04-26 NOTE — Progress Notes (Signed)
Patient received from [facility], V/S taken, CCMD notified, patient is in pain 10/10, will give PRN, orientation provided about the unit,   04/26/23 1356  Vitals  Temp 98.9 F (37.2 C)  Temp Source Oral  BP (!) 148/93  MAP (mmHg) 111  BP Location Left Arm  BP Method Automatic  Patient Position (if appropriate) Lying  Pulse Rate 78  Pulse Rate Source Monitor  ECG Heart Rate 92  Resp 15  Level of Consciousness  Level of Consciousness Alert  MEWS COLOR  MEWS Score Color Green  Oxygen Therapy  SpO2 100 %  O2 Device Room Air  Pain Assessment  Pain Scale 0-10  Pain Score 10  Pain Type Surgical pain  Pain Location Chest  Pain Orientation Mid  Pain Descriptors / Indicators Aching  Pain Frequency Constant  Pain Onset On-going  Patients Stated Pain Goal 0  Pain Intervention(s) Medication (See eMAR)  Multiple Pain Sites No  MEWS Score  MEWS Temp 0  MEWS Systolic 0  MEWS Pulse 0  MEWS RR 0  MEWS LOC 0  MEWS Score 0

## 2023-04-26 NOTE — Progress Notes (Signed)
OT Cancellation Note  Patient Details Name: David [name] MRN: [id number] DOB: 14-Dec-1960   Cancelled Treatment:    Reason Eval/Treat Not Completed: Pain limiting ability to participate;Other (comment) (Attempted to see pt for OT treat with pt premedicated prior to session. However, pt declines at this time due to pain and continued medication-related episodes of bowel incontinence. OT to reattempt to see pt at a later time as appropirate/available.)  Savino Whisenant "Ronaldo Miyamoto" M., OTR/L, MA Acute Rehab [phone]   Lendon Colonel 04/26/2023, 3:40 PM

## 2023-04-26 NOTE — Progress Notes (Signed)
8 Days Post-Op Procedure(s) (LRB): CORONARY ARTERY BYPASS GRAFTING (CABG) TIMES FOUR USING LEFT INTERNAL MAMMARY ARTERY AND ENDOSCOPICALLY HARVESTED LEFT GREATER SAPHENOUS VEIN (N/A) CLIPPING OF ATRIAL APPENDAGE USING ATRICURE ATRICLIP [id number] with pulmonary vein isolation ablation (N/A) TRANSESOPHAGEAL ECHOCARDIOGRAM (TEE) (N/A) Subjective: Had multiple BM's overnight after SMOG emema. Feels much better. Lost 5 lbs from diuresis and BM's.  Objective: Vital signs in last 24 hours: Temp:  [97.7 F (36.5 C)-98.6 F (37 C)] 97.9 F (36.6 C) (01/22 0329) Pulse Rate:  [74-95] 74 (01/22 0600) Cardiac Rhythm: Atrial fibrillation (01/22 0442) Resp:  [11-22] 16 (01/22 0600) BP: (107-164)/(71-109) 132/83 (01/22 0600) SpO2:  [85 %-100 %] 100 % (01/22 0600) Weight:  [85 kg] 85 kg (01/22 0600)  Hemodynamic parameters for last 24 hours:    Intake/Output from previous day: 01/21 0701 - 01/22 0700 In: 624.8 [P.O.:580; IV Piggyback:44.8] Out: 2785 [Urine:2785] Intake/Output this shift: No intake/output data recorded.  General appearance: alert and cooperative Neurologic: intact Heart: irregularly irregular rhythm Lungs: clear to auscultation bilaterally Abdomen: soft, less distended, non-tender; bowel sounds normal Extremities: edema mild Wound: incision healing well.  Lab Results: No results for input(s): "WBC", "HGB", "HCT", "PLT" in the last 72 hours. BMET:  Recent Labs    04/25/23 0329 04/26/23 0412  NA 129* 130*  K 4.1 5.3*  CL 95* 96*  CO2 22 22  GLUCOSE 121* 255*  BUN 81* 72*  CREATININE 3.71* 3.12*  CALCIUM 8.7* 8.9    PT/INR: No results for input(s): "LABPROT", "INR" in the last 72 hours. ABG    Component Value Date/Time   PHART 7.345 (L) 04/19/2023 1652   HCO3 19.3 (L) 04/19/2023 1652   TCO2 20 (L) 04/19/2023 1652   ACIDBASEDEF 6.0 (H) 04/19/2023 1652   O2SAT 98 04/19/2023 1652   CBG (last 3)  Recent Labs    04/25/23 1556 04/25/23 2110 04/26/23 0639   GLUCAP 133* 160* 212*    Assessment/Plan: S/P Procedure(s) (LRB): CORONARY ARTERY BYPASS GRAFTING (CABG) TIMES FOUR USING LEFT INTERNAL MAMMARY ARTERY AND ENDOSCOPICALLY HARVESTED LEFT GREATER SAPHENOUS VEIN (N/A) CLIPPING OF ATRIAL APPENDAGE USING ATRICURE ATRICLIP [id number] with pulmonary vein isolation ablation (N/A) TRANSESOPHAGEAL ECHOCARDIOGRAM (TEE) (N/A)  POD 8  Hemodynamically stable. Will DC midodrine.  Permanent atrial fib. Amio started postop for rate control since BP was not high enough to allow BB use. I think amio is unlikely to convert him to sinus rhythm so if his BP remains normal tomorrow off midodrine I would plan to DC amio and resume BB. His in on Eliquis and has LAA clip.  Severe constipation resolve with SMOG enema last night. Continue stool softeners with ongoing opioid use.  DM: glucose under adequate control.  AKI on CKD: creat continues to improve and diuresing well with lasix per nephrology. Wt is still about 7.5 lbs over preop.  Continue IS, ambulation.  Transfer to 4E.   LOS: 16 days    Alleen Borne 04/26/2023

## 2023-04-26 NOTE — Inpatient Diabetes Management (Signed)
Inpatient Diabetes Program Recommendations  AACE/ADA: New Consensus Statement on Inpatient Glycemic Control (2015)  Target Ranges:  Prepandial:   less than 140 mg/dL      Peak postprandial:   less than 180 mg/dL (1-2 hours)      Critically ill patients:  140 - 180 mg/dL   Lab Results  Component Value Date   GLUCAP 261 (H) 04/26/2023   HGBA1C 7.4 (H) 04/09/2023    Latest Reference Range & Units 04/25/23 06:47 04/25/23 09:28 04/25/23 11:39 04/25/23 15:56 04/25/23 21:10 04/26/23 06:39 04/26/23 07:39  Glucose-Capillary 70 - 99 mg/dL 469 (H) 629 (H) 528 (H) 133 (H) 160 (H) 212 (H) 261 (H)  (H): Data is abnormally high  Diabetes history: DM2 Outpatient Diabetes medications: Lantus 27 units every day, Farxiga 10 mg every day  Current orders for Inpatient glycemic control: Semglee 25 units daily, Novolog 0-24 units tid  Inpatient Diabetes Program Recommendations:   Please consider: -Increase Semglee to 27 units daily  Thank you, Darel Hong E. Mirza Kidney, RN, MSN, CDCES  Diabetes Coordinator Inpatient Glycemic Control Team Team Pager [phone] (8am-5pm) 04/26/2023 9:02 AM

## 2023-04-26 NOTE — Progress Notes (Signed)
Nephrology Follow-Up Consult note   Assessment/Recommendations: David [name] is a/an 63 y.o. male with a past medical history significant for HFmrEF, Atrial fibrillation, HTN, DM 2 Who presents with shortness of breath now status post CABG, Now complicated by AKI   Oliguric AKI on CKD 3 8: Baseline creatinine around 1.4-1.7.  Rising creatinine since surgery likely ATN after surgery possible cholesterol embolic injury.  Given low urine sodium also likely has some degree of cardiorenal syndrome contributing.  No uremic symptoms at this time. Responding to lasix, C3 NL but does not r/o cholesterol emboli.  -Cont with IV lasix 120mg  BID today (great response with  5 L/ 3.4L of urine output 24hr periods); will reorder again for today as he still has e/o volume excess today.  Patient is net -15 L during this hospitalization.  I am change and keep him on standing Lasix 80mg  BID which may need to be adjusted as he approaches his dry weight.  Renal function and serum sodium continue to improve which is consistent with cardiorenal syndrome.  Signing off at this time; please reconsult as needed.  -Continue to monitor daily Cr, Dose meds for GFR -Monitor Daily I/Os, Daily weight  -Maintain MAP>65 for optimal renal perfusion.  -Avoid nephrotoxic medications including NSAIDs -Use synthetic opioids (Fentanyl/Dilaudid) if needed -Currently no indication for HD   NSTEMI: Status post CABG on 1/14.  Cardiology and surgery following   Atrial fibrillation: Eliquis on discharge   HLD: On Crestor   DM 2: Management per primary team   Chronic pain: Management per primary team   Heart failure: Ejection fraction normal during surgery.  Diuretics as above   Hyponatremia: Likely in the setting of AKI with free water retention.  Urine sodium low. Serum sodium remains low. He is asymptomatic. Continue with lasix as above and repeat labs this afternoon   Metabolic acidosis: Bicarb slightly decreased.  Continue  monitor without therapy   Anemia: Likely multifactorial.  No ESA for now.  Transfusions if needed  Interval History/Subjective: Feeling better and believes breathing is more comfortable.  Not sleep well overnight, no n/v. UOP remains brisk    Medications:  Current Facility-Administered Medications  Medication Dose Route Frequency Provider Last Rate Last Admin   amiodarone (PACERONE) tablet 200 mg  200 mg Oral BID Alleen Borne, MD   200 mg at 04/25/23 2127   apixaban (ELIQUIS) tablet 5 mg  5 mg Oral BID Gardner Candle, RPH   5 mg at 04/25/23 2126   aspirin EC tablet 81 mg  81 mg Oral Daily Alleen Borne, MD   81 mg at 04/25/23 1610   Or   aspirin chewable tablet 81 mg  81 mg Per Tube Daily Alleen Borne, MD       bisacodyl (DULCOLAX) EC tablet 10 mg  10 mg Oral Daily Roddenberry, Myron G, PA-C   10 mg at 04/25/23 9604   Or   bisacodyl (DULCOLAX) suppository 10 mg  10 mg Rectal Daily Roddenberry, Myron G, PA-C       Chlorhexidine Gluconate Cloth 2 % PADS 6 each  6 each Topical Daily Alleen Borne, MD   6 each at 04/25/23 0938   dextrose 50 % solution 0-50 mL  0-50 mL Intravenous PRN Roddenberry, Myron G, PA-C       docusate sodium (COLACE) capsule 200 mg  200 mg Oral BID Wilmer Floor, RPH   200 mg at 04/25/23 2126   doxycycline (VIBRA-TABS) tablet 100 mg  100 mg Oral Q12H Alleen Borne, MD   100 mg at 04/25/23 2127   ezetimibe (ZETIA) tablet 10 mg  10 mg Oral Daily Leary Roca, PA-C   10 mg at 04/25/23 0936   fluticasone (FLONASE) 50 MCG/ACT nasal spray 2 spray  2 spray Each Nare Daily Leary Roca, PA-C   2 spray at 04/25/23 3536   furosemide (LASIX) 120 mg in dextrose 5 % 50 mL IVPB  120 mg Intravenous BID Ethelene Hal, MD 62 mL/hr at 04/25/23 1900 Infusion Verify at 04/25/23 1900   gabapentin (NEURONTIN) capsule 200 mg  200 mg Oral TID Alleen Borne, MD   200 mg at 04/25/23 2126   HYDROmorphone (DILAUDID) tablet 2 mg  2 mg Oral Q4H PRN Loreli Slot, MD   2 mg at 04/26/23 0442   insulin aspart (novoLOG) injection 0-24 Units  0-24 Units Subcutaneous TID WC Alleen Borne, MD   8 Units at 04/26/23 0647   insulin glargine-yfgn (SEMGLEE) injection 25 Units  25 Units Subcutaneous Daily Alleen Borne, MD   25 Units at 04/25/23 1443   methocarbamol (ROBAXIN) tablet 750 mg  750 mg Oral Q8H PRN Alleen Borne, MD   750 mg at 04/26/23 1540   neomycin-bacitracin-polymyxin (NEOSPORIN) ointment   Topical PRN Alleen Borne, MD       ondansetron [facility]) injection 4 mg  4 mg Intravenous Q6H PRN Roddenberry, Myron G, PA-C   4 mg at 04/24/23 0867   Oral care mouth rinse  15 mL Mouth Rinse PRN Alleen Borne, MD       pantoprazole (PROTONIX) EC tablet 40 mg  40 mg Oral Daily Leary Roca, PA-C   40 mg at 04/25/23 6195   polyethylene glycol (MIRALAX / GLYCOLAX) packet 17 g  17 g Oral BID Wilmer Floor, RPH   17 g at 04/25/23 2127   rosuvastatin (CRESTOR) tablet 40 mg  40 mg Oral Daily Orbie Pyo, MD   40 mg at 04/25/23 0932   senna (SENOKOT) tablet 17.2 mg  2 tablet Oral BID Wilmer Floor, RPH   17.2 mg at 04/25/23 2127   sodium chloride flush (NS) 0.9 % injection 10-40 mL  10-40 mL Intracatheter Q12H Alleen Borne, MD   10 mL at 04/25/23 0938   sodium chloride flush (NS) 0.9 % injection 10-40 mL  10-40 mL Intracatheter PRN Alleen Borne, MD       sodium chloride flush (NS) 0.9 % injection 3 mL  3 mL Intravenous Q12H Roddenberry, Myron G, PA-C   3 mL at 04/25/23 2200   sodium chloride flush (NS) 0.9 % injection 3 mL  3 mL Intravenous PRN Roddenberry, Myron G, PA-C       sodium chloride flush (NS) 0.9 % injection 3-10 mL  3-10 mL Intravenous Q12H Roddenberry, Myron G, PA-C   3 mL at 04/25/23 6712   sodium chloride flush (NS) 0.9 % injection 3-10 mL  3-10 mL Intravenous PRN Leary Roca, PA-C          Review of Systems: 10 systems reviewed and negative except per interval history/subjective  Physical Exam: Vitals:    04/26/23 0600 04/26/23 0700  BP: 132/83   Pulse: 74   Resp: 16   Temp:  98.9 F (37.2 C)  SpO2: 100%    No intake/output data recorded.  Intake/Output Summary (Last 24 hours) at 04/26/2023 0753 Last data filed at 04/26/2023 0649 Gross per  24 hour  Intake 624.77 ml  Output 2785 ml  Net -2160.23 ml   Constitutional: well-appearing, no acute distress ENMT: ears and nose without scars or lesions, MMM CV: normal rate, `+ edema to bilateral ankles Respiratory: Bilateral chest rise, normal work of breathing Gastrointestinal: soft, non-tender, moderate distention Skin: no visible lesions or rashes Ext: 1+ edema Psych: alert, judgement/insight appropriate, appropriate mood and affect   Test Results I personally reviewed new and old clinical labs and radiology tests Lab Results  Component Value Date   NA 130 (L) 04/26/2023   K 5.3 (H) 04/26/2023   CL 96 (L) 04/26/2023   CO2 22 04/26/2023   BUN 72 (H) 04/26/2023   CREATININE 3.12 (H) 04/26/2023   CALCIUM 8.9 04/26/2023   ALBUMIN 2.8 (L) 04/26/2023   PHOS 3.8 04/26/2023    CBC Recent Labs  Lab 04/19/23 1657 04/20/23 0207 04/21/23 0323  WBC 15.3* 16.5* 12.9*  HGB 10.3* 10.2* 9.3*  HCT 30.6* 29.6* 27.5*  MCV 96.2 96.1 96.2  PLT 102* 106* 111*

## 2023-04-27 LAB — MAGNESIUM: Magnesium: 1.7 mg/dL (ref 1.7–2.4)

## 2023-04-27 LAB — RENAL FUNCTION PANEL
Albumin: 2.5 g/dL — ABNORMAL LOW (ref 3.5–5.0)
Anion gap: 9 (ref 5–15)
BUN: 62 mg/dL — ABNORMAL HIGH (ref 8–23)
CO2: 25 mmol/L (ref 22–32)
Calcium: 8.7 mg/dL — ABNORMAL LOW (ref 8.9–10.3)
Chloride: 96 mmol/L — ABNORMAL LOW (ref 98–111)
Creatinine, Ser: 2.63 mg/dL — ABNORMAL HIGH (ref 0.61–1.24)
GFR, Estimated: 27 mL/min — ABNORMAL LOW (ref >60)
Glucose, Bld: 195 mg/dL — ABNORMAL HIGH (ref 70–99)
Phosphorus: 3.1 mg/dL (ref 2.5–4.6)
Potassium: 4.1 mmol/L (ref 3.5–5.1)
Sodium: 130 mmol/L — ABNORMAL LOW (ref 135–145)

## 2023-04-27 LAB — GLUCOSE, CAPILLARY
Glucose-Capillary: 174 mg/dL — ABNORMAL HIGH (ref 70–99)
Glucose-Capillary: 169 mg/dL — ABNORMAL HIGH (ref 70–99)
Glucose-Capillary: 213 mg/dL — ABNORMAL HIGH (ref 70–99)
Glucose-Capillary: 162 mg/dL — ABNORMAL HIGH (ref 70–99)

## 2023-04-27 MED ORDER — HYDROCODONE-ACETAMINOPHEN 10-325 MG PO TABS
1.0000 | ORAL_TABLET | ORAL | Status: DC | PRN
  Administered 2023-04-27 – 2023-05-02 (×23): 1 via ORAL
  Filled 2023-04-27 (×24): qty 1

## 2023-04-27 MED ORDER — MAGNESIUM SULFATE 2 GM/50ML IV SOLN
2.0000 g | Freq: Once | INTRAVENOUS | Status: AC
  Administered 2023-04-27: 2 g via INTRAVENOUS
  Filled 2023-04-27: qty 50

## 2023-04-27 MED ORDER — HYDROMORPHONE HCL 2 MG PO TABS
2.0000 mg | ORAL_TABLET | Freq: Four times a day (QID) | ORAL | Status: DC | PRN
  Administered 2023-04-27: 2 mg via ORAL
  Filled 2023-04-27: qty 1

## 2023-04-27 MED ORDER — CARVEDILOL 3.125 MG PO TABS
3.1250 mg | ORAL_TABLET | Freq: Two times a day (BID) | ORAL | Status: DC
  Administered 2023-04-27 – 2023-05-01 (×8): 3.125 mg via ORAL
  Filled 2023-04-27 (×10): qty 1

## 2023-04-27 MED ORDER — POTASSIUM CHLORIDE CRYS ER 20 MEQ PO TBCR
40.0000 meq | EXTENDED_RELEASE_TABLET | Freq: Once | ORAL | Status: AC
  Administered 2023-04-27: 40 meq via ORAL
  Filled 2023-04-27: qty 2

## 2023-04-27 MED ORDER — OXYCODONE HCL ER 10 MG PO T12A
10.0000 mg | EXTENDED_RELEASE_TABLET | Freq: Two times a day (BID) | ORAL | Status: DC
  Administered 2023-04-27 – 2023-05-02 (×11): 10 mg via ORAL
  Filled 2023-04-27 (×11): qty 1

## 2023-04-27 MED ORDER — HYDROMORPHONE HCL 2 MG PO TABS: 2.0000 mg | ORAL_TABLET | Freq: Once | ORAL | Status: DC

## 2023-04-27 NOTE — Progress Notes (Signed)
Physical Therapy Treatment Patient Details Name: David [name] MRN: [id number] DOB: [birth year] Today's Date: 04/27/2023   History of Present Illness Patient is 63 y.o. male who presented with dyspnea and is admitted for acute heart failure exacerbation. Pt now s/p CABGx1 performed on 04/18/23. Of note pt had extended hospital stay from 08/20/22-10/28/22 during which he underwent surgical repair 08/30/22: T5-11 posterior/posterolateral arthrodesis  & Transpedicular decompression with partial corpectomy, left T8 & T8-9 laminectomy, bilateral facetectomy. 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 Since discharge in May 2024 pt has represented to ED for back pain x3 and for fall x1.    PT Comments  Pt is progressing towards goals. Currently pt is Mod A for sit to stand, Min A +2 for gait due to poor endurance and Mod A for bed mobility. Pt requires frequent reminders to maintain sternal precautions; improved by end of session following multi modal cueing throughout. Due to pt current functional status, home set up and available assistance at home recommending skilled physical therapy services < 3 hours/day in order to address strength, balance and functional mobility to decrease risk for falls, injury, immobility, skin break down and re-hospitalization.     If plan is discharge home, recommend the following: Assist for transportation;Help with stairs or ramp for entrance;A lot of help with walking and/or transfers     Equipment Recommendations  Other (comment) (defer to post acute)       Precautions / Restrictions Precautions Precautions: Fall;Sternal Precaution Booklet Issued: No Precaution Comments: verbal cues to adhere functionally throughout session Restrictions Weight Bearing Restrictions Per  Provider Order: Yes RUE Weight Bearing Per Provider Order: Non weight bearing LUE Weight Bearing Per Provider Order: Non weight bearing Other Position/Activity Restrictions: sternal precautions     Mobility  Bed Mobility Overal bed mobility: Needs Assistance Bed Mobility: Rolling, Sidelying to Sit     Supine to sit: Mod assist     General bed mobility comments: HOB elevated, maximal directional verbal cues, mod A for trunk elevation    Transfers Overall transfer level: Needs assistance Equipment used: None Transfers: Sit to/from Stand Sit to Stand: +2 safety/equipment, Min assist           General transfer comment: Pt performed sit to stand 3x during session 2x at Min A +2 and 1x at Mod A +2 with verbal cues for body mechanics.    Ambulation/Gait Ambulation/Gait assistance: Min assist, +2 physical assistance, +2 safety/equipment Gait Distance (Feet): 400 Feet Assistive device: Rolling walker (2 wheels) Gait Pattern/deviations: Step-through pattern, Wide base of support, Decreased stride length Gait velocity: decreased Gait velocity interpretation: <1.31 ft/sec, indicative of household ambulator   General Gait Details: heel toe gait pattern with wide BOS and locking out at the knees most likely related to previous back surgery. Pt took 2x seated rest breaks during 400 ft of gait.     Balance Overall balance assessment: Needs assistance Sitting-balance support: No upper extremity supported, Feet supported Sitting balance-Leahy Scale: Fair     Standing balance support: Single extremity supported, Bilateral upper extremity supported, During functional activity, Reliant on assistive device for balance Standing balance-Leahy Scale: Fair Standing balance comment: CGA during gait.        Cognition Arousal: Alert Behavior During Therapy: WFL for tasks assessed/performed Overall Cognitive Status: No family/caregiver present to  determine baseline cognitive functioning        General Comments: Pt has poor insight into deficits and difficulty following sternal precautions. Pt with depressed mood and tearing up during session discussing how hard he worked to walk after his back surgery and lamenting that he is now doing poorly again though he is making good progress.           General Comments General comments (skin integrity, edema, etc.): HR 85 bpm after 200 ft of gait.      Pertinent Vitals/Pain Pain Assessment Pain Assessment: 0-10 Pain Score: 9  Pain Location: sternum Pain Descriptors / Indicators: Grimacing, Guarding, Discomfort Pain Intervention(s): Monitored during session, Limited activity within patient's tolerance, Patient requesting pain meds-RN notified, RN gave pain meds during session     PT Goals (current goals can now be found in the care plan section) Acute Rehab PT Goals Patient Stated Goal: get home and back to rehab PT Goal Formulation: With patient Time For Goal Achievement: 05/03/23 Potential to Achieve Goals: Good Progress towards PT goals: Progressing toward goals    Frequency    Min 1X/week      PT Plan  Continue with current POC        AM-PAC PT "6 Clicks" Mobility   Outcome Measure  Help needed turning from your back to your side while in a flat bed without using bedrails?: A Little Help needed moving from lying on your back to sitting on the side of a flat bed without using bedrails?: A Lot Help needed moving to and from a bed to a chair (including a wheelchair)?: A Lot Help needed standing up from a chair using your arms (e.g., wheelchair or bedside chair)?: A Lot Help needed to walk in hospital room?: A Lot Help needed climbing 3-5 steps with a railing? : Total 6 Click Score: 12    End of Session Equipment Utilized During Treatment: Gait belt Activity Tolerance: Patient tolerated treatment well Patient left: with call bell/phone within reach;Other (comment) (on [facility], nursing aware) Nurse  Communication: Mobility status;Other (comment) (pt on BSC) PT Visit Diagnosis: Unsteadiness on feet (R26.81);Difficulty in walking, not elsewhere classified (R26.2);Other symptoms and signs involving the nervous system (R29.898);Pain Pain - part of body:  (sternum)     Time: 1610-9604 PT Time Calculation (min) (ACUTE ONLY): 43 min  Charges:    $Gait Training: 8-22 mins $Therapeutic Activity: 23-37 mins PT General Charges $$ ACUTE PT VISIT: 1 Visit                    Harrel Carina, DPT, CLT  Acute Rehabilitation Services Office: [phone] (Secure chat preferred)    Claudia Desanctis 04/27/2023, 3:51 PM

## 2023-04-27 NOTE — Progress Notes (Addendum)
[address]       [place] [address]             [phone]      9 Days Post-Op Procedure(s) (LRB): CORONARY ARTERY BYPASS GRAFTING (CABG) TIMES FOUR USING LEFT INTERNAL MAMMARY ARTERY AND ENDOSCOPICALLY HARVESTED LEFT GREATER SAPHENOUS VEIN (N/A) CLIPPING OF ATRIAL APPENDAGE USING ATRICURE ATRICLIP [id number] with pulmonary vein isolation ablation (N/A) TRANSESOPHAGEAL ECHOCARDIOGRAM (TEE) (N/A) Subjective: Transferred from ICU yesterday.  Sitting up eating breakfast. Denies any abdominal discomfort but complains of anxiety related to finances from being out of work for several months in the past year.    Walked in the hall x 1 yesterday.  BM x 1 On RA.   Objective: Vital signs in last 24 hours: Temp:  [97.5 F (36.4 C)-98.9 F (37.2 C)] 97.5 F (36.4 C) (01/23 0307) Pulse Rate:  [72-87] 72 (01/23 0307) Cardiac Rhythm: Atrial flutter (01/22 2000) Resp:  [14-16] 16 (01/23 0307) BP: (106-148)/(65-95) 113/73 (01/23 0307) SpO2:  [94 %-100 %] 100 % (01/22 2315)    Intake/Output from previous day: 01/22 0701 - 01/23 0700 In: 240 [P.O.:240] Out: 3650 [Urine:3550; Stool:100] Intake/Output this shift: No intake/output data recorded.  General appearance: alert, cooperative, and anxious and tearful Neurologic: intact Heart: irregularly irregular rhythm, A-fib with controlled rate (chronic) Lungs: normal work of breathing on RA, breath sounds are shallow but clear bilaterally Abdomen: soft, no tenderness Extremities: 2+ bilateral LE edema. Expected bruising adjacent to the LLE EVH tunnel.  Wound: the sternotomy and EVH incisions are intact and dry.   Lab Results: No results for input(s): "WBC", "HGB", "HCT", "PLT" in the last 72 hours. BMET:  Recent Labs    04/26/23 0412 04/27/23 0305  NA 130* 130*  K 5.3* 4.1  CL 96* 96*  CO2 22 25  GLUCOSE 255* 195*  BUN 72* 62*  CREATININE 3.12* 2.63*  CALCIUM 8.9 8.7*    PT/INR: No results for input(s):  "LABPROT", "INR" in the last 72 hours. ABG    Component Value Date/Time   PHART 7.345 (L) 04/19/2023 1652   HCO3 19.3 (L) 04/19/2023 1652   TCO2 20 (L) 04/19/2023 1652   ACIDBASEDEF 6.0 (H) 04/19/2023 1652   O2SAT 98 04/19/2023 1652   CBG (last 3)  Recent Labs    04/26/23 1622 04/26/23 2111 04/27/23 0605  GLUCAP 173* 195* 174*    Assessment/Plan: S/P Procedure(s) (LRB): CORONARY ARTERY BYPASS GRAFTING (CABG) TIMES FOUR USING LEFT INTERNAL MAMMARY ARTERY AND ENDOSCOPICALLY HARVESTED LEFT GREATER SAPHENOUS VEIN (N/A) CLIPPING OF ATRIAL APPENDAGE USING ATRICURE ATRICLIP [id number] with pulmonary vein isolation ablation (N/A) TRANSESOPHAGEAL ECHOCARDIOGRAM (TEE) (N/A)  -POD 9 CABG x 4, LAA clip, and Pulmonary Vein Isolation for NSTEMI with EF 45% and chronic persistent atrial fibrillation.  On ASA, Crestor, and amiodarone.   -Chronic A-fib- good rate control on the amiodarone. Apixaban resumed.   -GI- post-op ileus, likely related to long-term opioid use. Having bowel function and tolerating PO's. Abd exam benign. Continue bowel regimen.   -AKI on CKD- renal function continues to improve and is diuresing. Wt is still ~12lbs+.  Continue Lasix at 80mg  IV q 12H for now. K+ 5.3-4.1 past 24 hours. Will supplement with a small dose of K-dur and monitor daily.  -ENDO- type 2 DM- on Semglee 27u daily. Control is fair.  Continue to monitor and provide SSI coverage. Resume his Marcelline Deist at discharge.   -Chronic pain- has had multi-level spinal fusion following vertebral osteomyelitis with  MRSA. On chronic suppressive therapy with doxycycline 100mg  BID  and on Robaxin and gabapentin for chronic pain.   -Anxiety / depression- worried about financial issues, inability to get the disability funds he has applied for, and also worried his wife is working two jobs "to keep [place] afloat". He had Xanax last night that he said helped him rest. Will ask the care management team to assess for financial assistance.    -Disposition- planning for eventual discharge to SNF for further physical rehab before returning home. OT consult pending.  Needs a few more days for inpatient care for close monitoring of renal function and diuresis.  Continue working on mobility.   LOS: 17 days    Leary Roca, PA-C 04/27/2023   Patient examined, most recent CXR image personally reviewed. Renal function improving, creat 2.6 on bid lasix with wt up 10 lbs and clear cxr. Follow BMP and titrate lasix dose. Incisions clean and dry Plan SNF next week.  patient examined and medical record reviewed,agree with above note. Lovett Sox 04/27/2023

## 2023-04-27 NOTE — Progress Notes (Signed)
CARDIAC REHAB PHASE I    Stopped by to offer walk in hallway. Pt on BSC for BM. Pt asked for pain medication. Would like to walk after receiving pain medication. Susmita RN notified. Will return later today for ambulation as time allows.    [phone] Woodroe Chen, RN BSN 04/27/2023 11:32 AM

## 2023-04-27 NOTE — Discharge Instructions (Signed)
Discharge Instructions:  1. You may shower, please wash incisions daily with soap and water and keep dry.  If you wish to cover wounds with dressing you may do so but please keep clean and change daily.  No tub baths or swimming until incisions have completely healed.  If your incisions become red or develop any drainage please call our office at [phone]  2. No Driving until cleared by Dr. Bartle's office and you are no longer using narcotic pain medications  3. Monitor your weight daily.. Please use the same scale and weigh at same time... If you gain 5-10 lbs in 48 hours with associated lower extremity swelling, please contact our office at [phone]  4. Fever of 101.5 for at least 24 hours with no source, please contact our office at [phone]  5. Activity- up as tolerated, please walk at least 3 times per day.  Avoid strenuous activity, no lifting, pushing, or pulling with your arms over 8-10 lbs for a minimum of 6 weeks  6. If any questions or concerns arise, please do not hesitate to contact our office at [phone]  

## 2023-04-27 NOTE — Progress Notes (Signed)
Mr. Klemm asked his RN to call me regarding pain management.  He is having back and para-sternal pain not controlled with the long-acting oxycodone and prn Dilaudid.  Per his request, I discontinued the Dilaudid and ordered his Norco 10/325 q4h as he was taking at home with good pain control. He was cautioned that the narcotics could interfere with the continued recovery of his bowel function and to limit the PRN use as much as possible.   Gaynelle Arabian, PA-C

## 2023-04-27 NOTE — Plan of Care (Signed)
  Problem: Education: Goal: Knowledge of General Education information will improve Description: Including pain rating scale, medication(s)/side effects and non-pharmacologic comfort measures Outcome: Progressing   Problem: Clinical Measurements: Goal: Ability to maintain clinical measurements within normal limits will improve Outcome: Progressing Goal: Will remain free from infection Outcome: Progressing Goal: Diagnostic test results will improve Outcome: Progressing Goal: Respiratory complications will improve Outcome: Progressing Goal: Cardiovascular complication will be avoided Outcome: Progressing   Problem: Pain Management: Goal: General experience of comfort will improve Outcome: Progressing

## 2023-04-28 ENCOUNTER — Ambulatory Visit: Payer: Medicaid Other | Admitting: Physical Therapy

## 2023-04-28 LAB — MAGNESIUM: Magnesium: 1.8 mg/dL (ref 1.7–2.4)

## 2023-04-28 LAB — BASIC METABOLIC PANEL
Anion gap: 9 (ref 5–15)
BUN: 64 mg/dL — ABNORMAL HIGH (ref 8–23)
CO2: 25 mmol/L (ref 22–32)
Calcium: 8.7 mg/dL — ABNORMAL LOW (ref 8.9–10.3)
Chloride: 96 mmol/L — ABNORMAL LOW (ref 98–111)
Creatinine, Ser: 2.31 mg/dL — ABNORMAL HIGH (ref 0.61–1.24)
GFR, Estimated: 31 mL/min — ABNORMAL LOW (ref >60)
Glucose, Bld: 222 mg/dL — ABNORMAL HIGH (ref 70–99)
Potassium: 5.1 mmol/L (ref 3.5–5.1)
Sodium: 130 mmol/L — ABNORMAL LOW (ref 135–145)

## 2023-04-28 LAB — CBC
HCT: 26.6 % — ABNORMAL LOW (ref 39.0–52.0)
Hemoglobin: 8.9 g/dL — ABNORMAL LOW (ref 13.0–17.0)
MCH: 31.7 pg (ref 26.0–34.0)
MCHC: 33.5 g/dL (ref 30.0–36.0)
MCV: 94.7 fL (ref 80.0–100.0)
Platelets: 248 10*3/uL (ref 150–400)
RBC: 2.81 MIL/uL — ABNORMAL LOW (ref 4.22–5.81)
RDW: 13.5 % (ref 11.5–15.5)
WBC: 12.4 10*3/uL — ABNORMAL HIGH (ref 4.0–10.5)
nRBC: 0 % (ref 0.0–0.2)

## 2023-04-28 LAB — GLUCOSE, CAPILLARY
Glucose-Capillary: 186 mg/dL — ABNORMAL HIGH (ref 70–99)
Glucose-Capillary: 167 mg/dL — ABNORMAL HIGH (ref 70–99)
Glucose-Capillary: 164 mg/dL — ABNORMAL HIGH (ref 70–99)
Glucose-Capillary: 143 mg/dL — ABNORMAL HIGH (ref 70–99)

## 2023-04-28 MED ORDER — INSULIN ASPART 100 UNIT/ML IJ SOLN
4.0000 [IU] | Freq: Three times a day (TID) | INTRAMUSCULAR | Status: DC
  Administered 2023-04-28 – 2023-05-02 (×9): 4 [IU] via SUBCUTANEOUS

## 2023-04-28 MED ORDER — INSULIN ASPART 100 UNIT/ML IJ SOLN
0.0000 [IU] | Freq: Three times a day (TID) | INTRAMUSCULAR | Status: DC
Start: 2023-04-28 — End: 2023-05-02
  Administered 2023-04-28 (×2): 4 [IU] via SUBCUTANEOUS
  Administered 2023-04-29: 7 [IU] via SUBCUTANEOUS
  Administered 2023-04-29 (×2): 3 [IU] via SUBCUTANEOUS
  Administered 2023-04-30 – 2023-05-01 (×3): 4 [IU] via SUBCUTANEOUS
  Administered 2023-05-01 – 2023-05-02 (×2): 3 [IU] via SUBCUTANEOUS

## 2023-04-28 MED ORDER — MAGNESIUM SULFATE 2 GM/50ML IV SOLN
2.0000 g | Freq: Once | INTRAVENOUS | Status: AC
  Administered 2023-04-28: 2 g via INTRAVENOUS
  Filled 2023-04-28: qty 50

## 2023-04-28 MED ORDER — FUROSEMIDE 10 MG/ML IJ SOLN
100.0000 mg | Freq: Two times a day (BID) | INTRAVENOUS | Status: DC
  Administered 2023-04-28 – 2023-04-29 (×3): 100 mg via INTRAVENOUS
  Filled 2023-04-28 (×7): qty 10

## 2023-04-28 MED ORDER — INSULIN GLARGINE-YFGN 100 UNIT/ML ~~LOC~~ SOLN
27.0000 [IU] | Freq: Every day | SUBCUTANEOUS | Status: DC
  Administered 2023-04-28 – 2023-05-01 (×4): 27 [IU] via SUBCUTANEOUS
  Filled 2023-04-28 (×5): qty 0.27

## 2023-04-28 NOTE — Progress Notes (Addendum)
[address]       [place] [address]             [phone]      10 Days Post-Op Procedure(s) (LRB): CORONARY ARTERY BYPASS GRAFTING (CABG) TIMES FOUR USING LEFT INTERNAL MAMMARY ARTERY AND ENDOSCOPICALLY HARVESTED LEFT GREATER SAPHENOUS VEIN (N/A) CLIPPING OF ATRIAL APPENDAGE USING ATRICURE ATRICLIP [id number] with pulmonary vein isolation ablation (N/A) TRANSESOPHAGEAL ECHOCARDIOGRAM (TEE) (N/A) Subjective: The patient continues to complain of incisional pain and back pain but states it is better since home Norco has been restarted. He had a bowel movement yesterday.  Objective: Vital signs in last 24 hours: Temp:  [97.8 F (36.6 C)-98.8 F (37.1 C)] 97.8 F (36.6 C) (01/24 0346) Pulse Rate:  [67-83] 67 (01/24 0346) Cardiac Rhythm: Atrial fibrillation (01/23 2122) Resp:  [15-20] 15 (01/24 0524) BP: (110-135)/(66-86) 126/79 (01/24 0346) SpO2:  [96 %-100 %] 98 % (01/24 0346) Weight:  [85.5 kg] 85.5 kg (01/24 0524)  Hemodynamic parameters for last 24 hours:    Intake/Output from previous day: 01/23 0701 - 01/24 0700 In: 600 [P.O.:600] Out: 2950 [Urine:2950] Intake/Output this shift: No intake/output data recorded.  General appearance: alert, cooperative, and no distress Neurologic: intact Heart: irregularly irregular rhythm Lungs: clear to auscultation bilaterally Abdomen: soft, non-tender; bowel sounds normal; no masses,  no organomegaly Extremities: edema 2+ bilateral LE Wound: Clean and dry without sign of infection  Lab Results: Recent Labs    04/28/23 0332  WBC 12.4*  HGB 8.9*  HCT 26.6*  PLT 248   BMET:  Recent Labs    04/27/23 0305 04/28/23 0332  NA 130* 130*  K 4.1 5.1  CL 96* 96*  CO2 25 25  GLUCOSE 195* 222*  BUN 62* 64*  CREATININE 2.63* 2.31*  CALCIUM 8.7* 8.7*    PT/INR: No results for input(s): "LABPROT", "INR" in the last 72 hours. ABG    Component Value Date/Time   PHART 7.345 (L) 04/19/2023 1652   HCO3 19.3  (L) 04/19/2023 1652   TCO2 20 (L) 04/19/2023 1652   ACIDBASEDEF 6.0 (H) 04/19/2023 1652   O2SAT 98 04/19/2023 1652   CBG (last 3)  Recent Labs    04/27/23 1617 04/27/23 2101 04/28/23 0612  GLUCAP 213* 162* 186*    Assessment/Plan: S/P Procedure(s) (LRB): CORONARY ARTERY BYPASS GRAFTING (CABG) TIMES FOUR USING LEFT INTERNAL MAMMARY ARTERY AND ENDOSCOPICALLY HARVESTED LEFT GREATER SAPHENOUS VEIN (N/A) CLIPPING OF ATRIAL APPENDAGE USING ATRICURE ATRICLIP [id number] with pulmonary vein isolation ablation (N/A) TRANSESOPHAGEAL ECHOCARDIOGRAM (TEE) (N/A)  Neuro: 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 On home Norco 10-325mg  Q4H PRN, Robaxin, Gabapentin and Oxycodone 10mg  Q12. Continue Doxycycline for suppressive therapy.   CV: NSTEMI. Hx of chronic persistent atrial fibrillation, LAA clip and pulmonary vein isolation. In atrial fibrillation with controlled ventricular rate, HR 60s. On Eliquis. BP controlled on Coreg 3.125mg  BID.   Pulm: Saturating well on RA. Encourage IS and ambulation.   GI: Postop ileus, likely related to long term opioid use resolved. +BM yesterday, tolerating a diet. Continue bowel regimen.   Endo: T2DM, preop A1C 7.4. CBGs elevated at times on Semglee 27U and SSI, will add 4U at mealtime. Will restart home insulin and Farxiga at discharge.   Renal: AKI on CKD. Cr 2.31 trending down from 2.63. Looks like baseline is around 1.4-1.7. UO 2950cc/24hrs, but weight +2lbs from yesterday? Titrate Lasix. Will add TED hose.   ID: Likely reactive leukocytosis,  WBC 12.4. Stable, monitor.   Expected postop ABLA: H/H 8.9/26.6, not clinically significant at this time.   Reactive thrombocytopenia: Resolved  Deconditioning: Continue work with PT/OT. SNF at discharge.   Dispo: Plan for SNF at discharge. Social work to assess for financial assistance.    LOS: 18 days    Jenny Reichmann, PA-C 04/28/2023  Agree with  above Continue diuresis.  Creat improved, but not at baseline Dispo planning

## 2023-04-28 NOTE — Progress Notes (Signed)
CARDIAC REHAB PHASE I    Pt declined walk in hall, continued to decline after encouragement. Pt reports he walked in hall in the early morning hours and wants to sleep now.  Encouraged increased mobility and frequent IS use.  Post OHS education including site care, restrictions, heart healthy diabetic diet, sternal precautions, IS use at discharge, exercise guidelines and CRP2 reviewed. All questions and concerns addressed. Will refer to [facility] for CRP2. Will continue to follow.   [id number] Woodroe Chen, RN BSN 04/28/2023 11:22 AM

## 2023-04-28 NOTE — Progress Notes (Signed)
Occupational Therapy Treatment Patient Details Name: David [name] MRN: [id number] DOB: Nov 12, 1960 Today's Date: 04/28/2023   History of present illness Patient is 63 y.o. male who presented with dyspnea and is admitted for acute heart failure exacerbation. Pt now s/p CABGx1 performed on 04/18/23. Of note pt had extended hospital stay from 08/20/22-10/28/22 during which he underwent surgical repair 08/30/22: T5-11 posterior/posterolateral arthrodesis  & Transpedicular decompression with partial corpectomy, left T8 & T8-9 laminectomy, bilateral facetectomy. 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 Since discharge in May 2024 pt has represented to ED for back pain x3 and for fall x1.   OT comments  OT session focused on training in techniques for increased safety and independence with ADLs and bed mobility and functional transfers/mobility during/ in preparation for funcitonal tasks while adhering to sternal precautions. Pt now demonstrates ability to Independently state sternal precautions without cues and demonstrates good carryover during functional tasks. Pt currently demonstrates ability to largely complete ADLs with Set up to Mod assist, bed mobility with Contact guard to Mod assist, and functional transfers/mobility with Contact guard to Mod assist, all while adhering to sternal precautions. VSS on RA throughout session. Pt participated well in session and is making progress toward goals. Pt will benefit from continued acute skilled OT services to address deficits outlined below and increase safety and independence with functional tasks. Post acute discharge, pt will benefit from intensive inpatient skilled rehab services < 3 hours per day to maximize rehab potential.       If plan is discharge home,  recommend the following:  A lot of help with walking and/or transfers;A lot of help with bathing/dressing/bathroom;Assistance with cooking/housework;Assist for transportation;Help with stairs or ramp for entrance   Equipment Recommendations  Tub/shower bench    Recommendations for Other Services      Precautions / Restrictions Precautions Precautions: Fall;Sternal Precaution Booklet Issued: No Precaution Comments: pt able to independently state sternal precautions without cues and demonstrated good carryover throughout session Restrictions Weight Bearing Restrictions Per Provider Order: Yes RUE Weight Bearing Per Provider Order: Non weight bearing LUE Weight Bearing Per Provider Order: Non weight bearing Other Position/Activity Restrictions: sternal precautions       Mobility Bed Mobility Overal bed mobility: Needs Assistance Bed Mobility: Sidelying to Sit, Sit to Sidelying   Sidelying to sit: Contact guard assist, Min assist, HOB elevated (adhering to sternal precautions; largely CGA with Min assist to bring hips to EOB)     Sit to sidelying: Mod assist General bed mobility comments: Min assist to bring hips to EOB and Mod assist to elevate B LE into bed while adhering to sternal precautions    Transfers Overall transfer level: Needs assistance Equipment used: None, Rolling walker (2 wheels) Transfers: Sit to/from Stand, Bed to chair/wheelchair/BSC Sit to Stand: Mod assist (without an AD; hugging heart pillow)     Step pivot transfers: Mod assist, Contact guard assist (with RW; Mod assist to power up while hugging heart pillow and Contact guard assist with RW once in standing)           Balance Overall balance assessment: Needs assistance Sitting-balance support: No upper extremity supported, Feet supported Sitting balance-Leahy Scale: Fair     Standing balance support: Single extremity supported, Bilateral upper extremity supported, During functional activity,  Reliant on assistive device  for balance Standing balance-Leahy Scale: Fair                             ADL either performed or assessed with clinical judgement   ADL Overall ADL's : Needs assistance/impaired     Grooming: Wash/dry hands;Wash/dry face;Oral care;Contact guard assist;Standing (adhering to sternal precautions)           Upper Body Dressing : Minimal assistance;Sitting (adhering to sternal precautions)       Toilet Transfer: Moderate assistance;Ambulation;Regular Toilet;Rolling walker (2 wheels);Contact guard assist Toilet Transfer Details (indicate cue type and reason): Mod assist to power up and Contact guard assist once in standing Toileting- Clothing Manipulation and Hygiene: Set up;Sitting/lateral lean;Contact guard assist;Sit to/from stand (adhering to sternal precautions) Toileting - Clothing Manipulation Details (indicate cue type and reason): largely Set up in sit/lateral lean with Min assist in stand to ensure thouroughness with pericare while adhering to sternal precautions     Functional mobility during ADLs: Contact guard assist;Moderate assistance;Rolling walker (2 wheels) (adhering to sternal precauitons; Mad assist to power up and Contact guard assist once in standing) General ADL Comments: Pt conitnues to present with decreased activity tolerance but is making good progress toward goals. Pt currently demonstrates ability to stand at sink during grooming tasks for 4 minuntes prior to needing rest break.    Extremity/Trunk Assessment Upper Extremity Assessment Upper Extremity Assessment: Right hand dominant;Generalized weakness (tested within sternal precautions)   Lower Extremity Assessment Lower Extremity Assessment: Defer to PT evaluation        Vision       Perception     Praxis      Cognition Arousal: Alert Behavior During Therapy: [facility] for tasks assessed/performed Overall Cognitive Status: Within Functional Limits for tasks  assessed                                 General Comments: Pt AAOx4 and pleasant throughout session. This session, pt demonstrated ability to independently state sternal precautions without cues and demonstrated good carryover during functional tasks        Exercises      Shoulder Instructions       General Comments VSS on RA throughout session    Pertinent Vitals/ Pain       Pain Assessment Pain Assessment: Faces Faces Pain Scale: Hurts even more Pain Location: sternum; back Pain Descriptors / Indicators: Grimacing, Guarding, Discomfort, Aching, Sore, Other (Comment) (Itching at incision site) Pain Intervention(s): Limited activity within patient's tolerance, Monitored during session, Repositioned, Premedicated before session  Home Living                                          Prior Functioning/Environment              Frequency  Min 1X/week        Progress Toward Goals  OT Goals(current goals can now be found in the care plan section)  Progress towards OT goals: Progressing toward goals  Acute Rehab OT Goals Patient Stated Goal: to be independent, have less pain, and return home  Plan      Co-evaluation                 AM-PAC OT "6 Clicks" Daily Activity  Outcome Measure   Help from another person eating meals?: None Help from another person taking care of personal grooming?: A Little Help from another person toileting, which includes using toliet, bedpan, or urinal?: A Little Help from another person bathing (including washing, rinsing, drying)?: A Lot Help from another person to put on and taking off regular upper body clothing?: A Little Help from another person to put on and taking off regular lower body clothing?: A Lot 6 Click Score: 17    End of Session Equipment Utilized During Treatment: Gait belt;Rolling walker (2 wheels)  OT Visit Diagnosis: Other abnormalities of gait and mobility  (R26.89);Muscle weakness (generalized) (M62.81);Other (comment) (decreased actvity tolerance)   Activity Tolerance Patient tolerated treatment well   Patient Left in bed;with call bell/phone within reach;with bed alarm set   Nurse Communication Mobility status;Other (comment) (pt had BM during session)        Time: 9562-1308 OT Time Calculation (min): 52 min  Charges: OT General Charges $OT Visit: 1 Visit OT Treatments $Self Care/Home Management : 38-52 mins  Jeannelle Wiens "Orson Eva., OTR/L, MA Acute Rehab [phone]   Lendon Colonel 04/28/2023, 6:26 PM

## 2023-04-28 NOTE — TOC Progression Note (Signed)
Transition of Care [facility]) - Progression Note    Patient Details  Name: David [name] MRN: [id number] Date of Birth: 1960-05-10  Transition of Care [facility]) CM/SW Contact  Eduard Roux, [place] Phone Number: 04/28/2023, 11:55 AM  Clinical Narrative:     CSW spoke with patient- informed of bed offer w/ [facility]. Patient accepted bed offer.  Called [facility] Admission Coordinator - left voice message to return call- waiting on response   TOC will continue to follow and assist with discharge planning.  Antony Blackbird, MSW, LCSW Clinical Social Worker    Expected Discharge Plan: Skilled Nursing Facility Barriers to Discharge: Continued Medical Work up  Expected Discharge Plan and Services In-house Referral: Clinical Social Work Discharge Planning Services: CM Consult Post Acute Care Choice: NA Living arrangements for the past 2 months: Single Family Home                 DME Arranged: N/A DME Agency: NA       HH Arranged: NA           Social Determinants of Health (SDOH) Interventions SDOH Screenings   Food Insecurity: Food Insecurity Present (04/17/2023)  Housing: High Risk (04/17/2023)  Transportation Needs: Unmet Transportation Needs (04/17/2023)  Utilities: Not At Risk (04/10/2023)  Alcohol Screen: Low Risk  (03/01/2023)  Depression (PHQ2-9): High Risk (12/29/2022)  Financial Resource Strain: Medium Risk (03/01/2023)  Physical Activity: Insufficiently Active (03/01/2023)  Social Connections: Unknown (03/01/2023)  Stress: Stress Concern Present (03/01/2023)  Tobacco Use: Medium Risk (04/18/2023)    Readmission Risk Interventions    09/06/2021   12:07 PM  Readmission Risk Prevention Plan  Post Dischage Appt Complete  Medication Screening Complete  Transportation Screening Complete

## 2023-04-29 LAB — BASIC METABOLIC PANEL
Anion gap: 10 (ref 5–15)
BUN: 59 mg/dL — ABNORMAL HIGH (ref 8–23)
CO2: 24 mmol/L (ref 22–32)
Calcium: 8.4 mg/dL — ABNORMAL LOW (ref 8.9–10.3)
Chloride: 93 mmol/L — ABNORMAL LOW (ref 98–111)
Creatinine, Ser: 2.2 mg/dL — ABNORMAL HIGH (ref 0.61–1.24)
GFR, Estimated: 33 mL/min — ABNORMAL LOW (ref >60)
Glucose, Bld: 192 mg/dL — ABNORMAL HIGH (ref 70–99)
Potassium: 4.3 mmol/L (ref 3.5–5.1)
Sodium: 127 mmol/L — ABNORMAL LOW (ref 135–145)

## 2023-04-29 LAB — GLUCOSE, CAPILLARY
Glucose-Capillary: 131 mg/dL — ABNORMAL HIGH (ref 70–99)
Glucose-Capillary: 130 mg/dL — ABNORMAL HIGH (ref 70–99)
Glucose-Capillary: 172 mg/dL — ABNORMAL HIGH (ref 70–99)
Glucose-Capillary: 137 mg/dL — ABNORMAL HIGH (ref 70–99)
Glucose-Capillary: 198 mg/dL — ABNORMAL HIGH (ref 70–99)

## 2023-04-29 LAB — MAGNESIUM: Magnesium: 1.8 mg/dL (ref 1.7–2.4)

## 2023-04-29 MED ORDER — MAGNESIUM SULFATE 4 GM/100ML IV SOLN
4.0000 g | Freq: Once | INTRAVENOUS | Status: AC
  Administered 2023-04-29: 4 g via INTRAVENOUS
  Filled 2023-04-29: qty 100

## 2023-04-29 NOTE — Progress Notes (Addendum)
      [address]       [place] [address]             [phone]      11 Days Post-Op Procedure(s) (LRB): CORONARY ARTERY BYPASS GRAFTING (CABG) TIMES FOUR USING LEFT INTERNAL MAMMARY ARTERY AND ENDOSCOPICALLY HARVESTED LEFT GREATER SAPHENOUS VEIN (N/A) CLIPPING OF ATRIAL APPENDAGE USING ATRICURE ATRICLIP [id number] with pulmonary vein isolation ablation (N/A) TRANSESOPHAGEAL ECHOCARDIOGRAM (TEE) (N/A)  Subjective:  Patient sitting up in bed eating breakfast.  States he has been up urinating frequently.  Objective: Vital signs in last 24 hours: Temp:  [97.6 F (36.4 C)-98.8 F (37.1 C)] 97.6 F (36.4 C) (01/25 0352) Pulse Rate:  [58-68] 64 (01/25 0352) Cardiac Rhythm: Atrial fibrillation (01/24 1900) Resp:  [15-20] 17 (01/25 0352) BP: (104-135)/(58-78) 107/68 (01/25 0352) SpO2:  [92 %-100 %] 97 % (01/25 0352) Weight:  [88.3 kg] 88.3 kg (01/25 0352)  Intake/Output from previous day: 01/24 0701 - 01/25 0700 In: 1232.3 [P.O.:1140; IV Piggyback:92.3] Out: 3505 [Urine:3505]  General appearance: alert, cooperative, and no distress Heart: irregularly irregular rhythm Lungs: clear to auscultation bilaterally Abdomen: soft, non-tender; bowel sounds normal; no masses,  no organomegaly Extremities: edema improved, non pitting, TED hose in place Wound: clean and dry  Lab Results: Recent Labs    04/28/23 0332  WBC 12.4*  HGB 8.9*  HCT 26.6*  PLT 248   BMET:  Recent Labs    04/28/23 0332 04/29/23 0252  NA 130* 127*  K 5.1 4.3  CL 96* 93*  CO2 25 24  GLUCOSE 222* 192*  BUN 64* 59*  CREATININE 2.31* 2.20*  CALCIUM 8.7* 8.4*    PT/INR: No results for input(s): "LABPROT", "INR" in the last 72 hours. ABG    Component Value Date/Time   PHART 7.345 (L) 04/19/2023 1652   HCO3 19.3 (L) 04/19/2023 1652   TCO2 20 (L) 04/19/2023 1652   ACIDBASEDEF 6.0 (H) 04/19/2023 1652   O2SAT 98 04/19/2023 1652   CBG (last 3)  Recent Labs    04/28/23 1643  04/28/23 2124 04/29/23 0608  GLUCAP 164* 143* 131*    Assessment/Plan: S/P Procedure(s) (LRB): CORONARY ARTERY BYPASS GRAFTING (CABG) TIMES FOUR USING LEFT INTERNAL MAMMARY ARTERY AND ENDOSCOPICALLY HARVESTED LEFT GREATER SAPHENOUS VEIN (N/A) CLIPPING OF ATRIAL APPENDAGE USING ATRICURE ATRICLIP [id number] with pulmonary vein isolation ablation (N/A) TRANSESOPHAGEAL ECHOCARDIOGRAM (TEE) (N/A)  CV- A. Fib rate controlled, chronic.. S/P PV Isolation, LAA clip- continue Eliquis, Coreg Pulm- no acute issues, off oxygen, continue use of IS Renal- AKI on CKD.. creatinine continues to improve down to 2.2 this morning.. weight is not accurate, as patient states he was not weighed this morning.. he diuresed 3.5L yesterday, continue diuretics at current regimen.. I have asked nursing to get patient weight on a stand up scale Chronic pain- continue current regimen,well controlled GI- post operative ileus resolved, continue prn stool agents DM- sugars are improved with increase in insulin, continue semeglee, SSIP Deconditioning- patient lives along, extensive back surgical history.. will require SNF at discharge.. has offer, can hopefully plan for d/c on Monday   LOS: 19 days    Lowella Dandy, PA-C 04/29/2023  Agree with above Creat trending down Dispo planning  Jashiya Bassett O Yamaris Cummings

## 2023-04-29 NOTE — Plan of Care (Signed)

## 2023-04-29 NOTE — Progress Notes (Signed)
CARDIAC REHAB PHASE II  Pt in bed, feeling a little better today. Says he's happy he can get up to brush his teeth and ambulate a little with OT but it is still very painful. Full education done yesterday, but reviewed education on sternal precautions, moving in the tube, cardiac rehab once he's home from SNF and IS use. Patient able to teach back information. All questions answered. Left in bed with call bell in reach and NT in room.   Harrie Jeans ACSM-CEP 04/29/2023 8:41 AM

## 2023-04-30 LAB — BASIC METABOLIC PANEL
Anion gap: 7 (ref 5–15)
BUN: 60 mg/dL — ABNORMAL HIGH (ref 8–23)
CO2: 28 mmol/L (ref 22–32)
Calcium: 8.7 mg/dL — ABNORMAL LOW (ref 8.9–10.3)
Chloride: 95 mmol/L — ABNORMAL LOW (ref 98–111)
Creatinine, Ser: 2.5 mg/dL — ABNORMAL HIGH (ref 0.61–1.24)
GFR, Estimated: 29 mL/min — ABNORMAL LOW (ref >60)
Glucose, Bld: 160 mg/dL — ABNORMAL HIGH (ref 70–99)
Potassium: 4.7 mmol/L (ref 3.5–5.1)
Sodium: 130 mmol/L — ABNORMAL LOW (ref 135–145)

## 2023-04-30 LAB — MAGNESIUM: Magnesium: 2.4 mg/dL (ref 1.7–2.4)

## 2023-04-30 LAB — GLUCOSE, CAPILLARY
Glucose-Capillary: 131 mg/dL — ABNORMAL HIGH (ref 70–99)
Glucose-Capillary: 179 mg/dL — ABNORMAL HIGH (ref 70–99)
Glucose-Capillary: 133 mg/dL — ABNORMAL HIGH (ref 70–99)

## 2023-04-30 MED ORDER — FUROSEMIDE 40 MG PO TABS
80.0000 mg | ORAL_TABLET | Freq: Two times a day (BID) | ORAL | Status: DC
Start: 2023-04-30 — End: 2023-05-02
  Administered 2023-04-30 – 2023-05-02 (×5): 80 mg via ORAL
  Filled 2023-04-30 (×5): qty 2

## 2023-04-30 MED ORDER — LACTULOSE 10 GM/15ML PO SOLN
20.0000 g | Freq: Once | ORAL | Status: AC
  Administered 2023-04-30: 20 g via ORAL
  Filled 2023-04-30: qty 30

## 2023-04-30 NOTE — Progress Notes (Addendum)
      [address]       [place] [address]             [phone]      12 Days Post-Op Procedure(s) (LRB): CORONARY ARTERY BYPASS GRAFTING (CABG) TIMES FOUR USING LEFT INTERNAL MAMMARY ARTERY AND ENDOSCOPICALLY HARVESTED LEFT GREATER SAPHENOUS VEIN (N/A) CLIPPING OF ATRIAL APPENDAGE USING ATRICURE ATRICLIP [id number] with pulmonary vein isolation ablation (N/A) TRANSESOPHAGEAL ECHOCARDIOGRAM (TEE) (N/A)  Subjective:  Patient complains of constipation.  Also upset that nurse was very intense and accusatory about telemetry monitoring.  Somehow this was discontinued yesterday, despite order in computer.  He is agreeable to monitoring.  Objective: Vital signs in last 24 hours: Temp:  [97.4 F (36.3 C)-98.6 F (37 C)] 98.4 F (36.9 C) (01/26 0421) Pulse Rate:  [59-70] 59 (01/26 0421) Resp:  [17-18] 18 (01/26 0421) BP: (95-119)/(65-76) 106/76 (01/26 0421) SpO2:  [97 %-100 %] 100 % (01/26 0421) Weight:  [85.4 kg] 85.4 kg (01/26 0500)  Intake/Output from previous day: 01/25 0701 - 01/26 0700 In: 210.7 [IV Piggyback:210.7] Out: 3385 [Urine:3385]  General appearance: alert, cooperative, and no distress Heart: irregularly irregular rhythm Lungs: clear to auscultation bilaterally Abdomen: soft, non-tender; bowel sounds normal; no masses,  no organomegaly Extremities: edema trace Wound: clean and dry  Lab Results: Recent Labs    04/28/23 0332  WBC 12.4*  HGB 8.9*  HCT 26.6*  PLT 248   BMET:  Recent Labs    04/29/23 0252 04/30/23 0254  NA 127* 130*  K 4.3 4.7  CL 93* 95*  CO2 24 28  GLUCOSE 192* 160*  BUN 59* 60*  CREATININE 2.20* 2.50*  CALCIUM 8.4* 8.7*    PT/INR: No results for input(s): "LABPROT", "INR" in the last 72 hours. ABG    Component Value Date/Time   PHART 7.345 (L) 04/19/2023 1652   HCO3 19.3 (L) 04/19/2023 1652   TCO2 20 (L) 04/19/2023 1652   ACIDBASEDEF 6.0 (H) 04/19/2023 1652   O2SAT 98 04/19/2023 1652   CBG (last 3)  Recent  Labs    04/29/23 1629 04/29/23 2059 04/30/23 0612  GLUCAP 137* 198* 131*    Assessment/Plan: S/P Procedure(s) (LRB): CORONARY ARTERY BYPASS GRAFTING (CABG) TIMES FOUR USING LEFT INTERNAL MAMMARY ARTERY AND ENDOSCOPICALLY HARVESTED LEFT GREATER SAPHENOUS VEIN (N/A) CLIPPING OF ATRIAL APPENDAGE USING ATRICURE ATRICLIP [id number] with pulmonary vein isolation ablation (N/A) TRANSESOPHAGEAL ECHOCARDIOGRAM (TEE) (N/A)  CV- chronic A. Fib, S/P PV Isolation, LAA Clip- on Eliquis, Coreg Pulm- no acute issues, not requiring oxygen, good use of IS Renal- AKI on CKD, diuresed another 3.5 L, slight bump in creatinine up to 2.5, will stop IV lasix and transition to 80 mg BID.Marland Kitchen repeat creatinine in AM Chronic pain- continue home regimen GI- constipation, hasn't moved bowels in 4 days.. will give lactulose today, continue Miralax BID.Marland Kitchen  DM- sugars are stable, continue insulin, SSIP Deconditioning- needs SNF placement.. working with PT/OT.Marland Kitchen wants to get stronger to get home   LOS: 20 days    Lowella Dandy, PA-C 04/30/2023  Agree with above Dispo planning  David [name]

## 2023-04-30 NOTE — Plan of Care (Signed)
Problem: Education: Goal: Knowledge of General Education information will improve Description: Including pain rating scale, medication(s)/side effects and non-pharmacologic comfort measures Outcome: Progressing   Problem: Health Behavior/Discharge Planning: Goal: Ability to manage health-related needs will improve Outcome: Progressing   Problem: Clinical Measurements: Goal: Ability to maintain clinical measurements within normal limits will improve Outcome: Progressing Goal: Will remain free from infection Outcome: Progressing Goal: Diagnostic test results will improve Outcome: Progressing Goal: Respiratory complications will improve Outcome: Progressing Goal: Cardiovascular complication will be avoided Outcome: Progressing   Problem: Pain Management: Goal: General experience of comfort will improve Outcome: Progressing

## 2023-05-01 ENCOUNTER — Encounter: Payer: Medicaid Other | Admitting: Physical Therapy

## 2023-05-01 LAB — GLUCOSE, CAPILLARY
Glucose-Capillary: 186 mg/dL — ABNORMAL HIGH (ref 70–99)
Glucose-Capillary: 158 mg/dL — ABNORMAL HIGH (ref 70–99)
Glucose-Capillary: 184 mg/dL — ABNORMAL HIGH (ref 70–99)
Glucose-Capillary: 143 mg/dL — ABNORMAL HIGH (ref 70–99)
Glucose-Capillary: 184 mg/dL — ABNORMAL HIGH (ref 70–99)

## 2023-05-01 LAB — MAGNESIUM: Magnesium: 1.8 mg/dL (ref 1.7–2.4)

## 2023-05-01 LAB — BASIC METABOLIC PANEL
Anion gap: 10 (ref 5–15)
BUN: 57 mg/dL — ABNORMAL HIGH (ref 8–23)
CO2: 24 mmol/L (ref 22–32)
Calcium: 8.6 mg/dL — ABNORMAL LOW (ref 8.9–10.3)
Chloride: 94 mmol/L — ABNORMAL LOW (ref 98–111)
Creatinine, Ser: 2.24 mg/dL — ABNORMAL HIGH (ref 0.61–1.24)
GFR, Estimated: 33 mL/min — ABNORMAL LOW (ref >60)
Glucose, Bld: 179 mg/dL — ABNORMAL HIGH (ref 70–99)
Potassium: 4.2 mmol/L (ref 3.5–5.1)
Sodium: 128 mmol/L — ABNORMAL LOW (ref 135–145)

## 2023-05-01 MED ORDER — SODIUM CHLORIDE 0.9% FLUSH
3.0000 mL | Freq: Two times a day (BID) | INTRAVENOUS | Status: DC
  Administered 2023-05-02: 3 mL via INTRAVENOUS

## 2023-05-01 MED ORDER — TAMSULOSIN HCL 0.4 MG PO CAPS
0.4000 mg | ORAL_CAPSULE | Freq: Every day | ORAL | Status: DC
  Administered 2023-05-01: 0.4 mg via ORAL
  Filled 2023-05-01: qty 1

## 2023-05-01 MED ORDER — SODIUM CHLORIDE 0.9% FLUSH: 3.0000 mL | INTRAVENOUS | Status: DC | PRN

## 2023-05-01 MED ORDER — MAGNESIUM SULFATE 2 GM/50ML IV SOLN
2.0000 g | Freq: Once | INTRAVENOUS | Status: AC
  Administered 2023-05-01: 2 g via INTRAVENOUS
  Filled 2023-05-01: qty 50

## 2023-05-01 MED ORDER — PANTOPRAZOLE SODIUM 40 MG PO TBEC: 40.0000 mg | DELAYED_RELEASE_TABLET | Freq: Every day | ORAL | Status: DC

## 2023-05-01 MED ORDER — ASPIRIN 81 MG PO TBEC
81.0000 mg | DELAYED_RELEASE_TABLET | Freq: Every day | ORAL | Status: DC
  Administered 2023-05-01 – 2023-05-02 (×2): 81 mg via ORAL
  Filled 2023-05-01 (×3): qty 1

## 2023-05-01 MED ORDER — ~~LOC~~ CARDIAC SURGERY, PATIENT & FAMILY EDUCATION: Freq: Once | Status: DC

## 2023-05-01 MED ORDER — SODIUM CHLORIDE 0.9 % IV SOLN: 250.0000 mL | INTRAVENOUS | Status: DC | PRN

## 2023-05-01 MED ORDER — METOLAZONE 5 MG PO TABS
5.0000 mg | ORAL_TABLET | Freq: Once | ORAL | Status: AC
  Administered 2023-05-01: 5 mg via ORAL
  Filled 2023-05-01: qty 1

## 2023-05-01 NOTE — Plan of Care (Signed)
  Problem: Education: Goal: Knowledge of General Education information will improve Description: Including pain rating scale, medication(s)/side effects and non-pharmacologic comfort measures Outcome: Progressing   Problem: Health Behavior/Discharge Planning: Goal: Ability to manage health-related needs will improve Outcome: Progressing   Problem: Clinical Measurements: Goal: Ability to maintain clinical measurements within normal limits will improve Outcome: Progressing Goal: Will remain free from infection Outcome: Progressing Goal: Diagnostic test results will improve Outcome: Progressing Goal: Respiratory complications will improve Outcome: Progressing Goal: Cardiovascular complication will be avoided Outcome: Progressing   Problem: Activity: Goal: Risk for activity intolerance will decrease Outcome: Progressing   Problem: Nutrition: Goal: Adequate nutrition will be maintained Outcome: Progressing   Problem: Coping: Goal: Level of anxiety will decrease Outcome: Progressing   Problem: Elimination: Goal: Will not experience complications related to bowel motility Outcome: Progressing Goal: Will not experience complications related to urinary retention Outcome: Progressing   Problem: Pain Management: Goal: General experience of comfort will improve Outcome: Progressing   Problem: Safety: Goal: Ability to remain free from injury will improve Outcome: Progressing   Problem: Skin Integrity: Goal: Risk for impaired skin integrity will decrease Outcome: Progressing   Problem: Education: Goal: Ability to describe self-care measures that may prevent or decrease complications (Diabetes Survival Skills Education) will improve Outcome: Progressing Goal: Individualized Educational Video(s) Outcome: Progressing   Problem: Coping: Goal: Ability to adjust to condition or change in health will improve Outcome: Progressing   Problem: Fluid Volume: Goal: Ability to  maintain a balanced intake and output will improve Outcome: Progressing   Problem: Health Behavior/Discharge Planning: Goal: Ability to identify and utilize available resources and services will improve Outcome: Progressing Goal: Ability to manage health-related needs will improve Outcome: Progressing   Problem: Metabolic: Goal: Ability to maintain appropriate glucose levels will improve Outcome: Progressing   Problem: Nutritional: Goal: Maintenance of adequate nutrition will improve Outcome: Progressing Goal: Progress toward achieving an optimal weight will improve Outcome: Progressing   Problem: Skin Integrity: Goal: Risk for impaired skin integrity will decrease Outcome: Progressing   Problem: Tissue Perfusion: Goal: Adequacy of tissue perfusion will improve Outcome: Progressing   Problem: Education: Goal: Understanding of CV disease, CV risk reduction, and recovery process will improve Outcome: Progressing Goal: Individualized Educational Video(s) Outcome: Progressing   Problem: Activity: Goal: Ability to return to baseline activity level will improve Outcome: Progressing   Problem: Health Behavior/Discharge Planning: Goal: Ability to safely manage health-related needs after discharge will improve Outcome: Progressing   Problem: Education: Goal: Will demonstrate proper wound care and an understanding of methods to prevent future damage Outcome: Progressing Goal: Knowledge of disease or condition will improve Outcome: Progressing Goal: Knowledge of the prescribed therapeutic regimen will improve Outcome: Progressing Goal: Individualized Educational Video(s) Outcome: Progressing

## 2023-05-01 NOTE — Progress Notes (Signed)
Physical Therapy Treatment Patient Details Name: David [name] MRN: [id number] DOB: 06-Aug-1960 Today's Date: 05/01/2023   History of Present Illness Patient is 63 y.o. male who presented with dyspnea and is admitted for acute heart failure exacerbation. Pt now s/p CABGx1 performed on 04/18/23. Of note pt had extended hospital stay from 08/20/22-10/28/22 during which he underwent surgical repair 08/30/22: T5-11 posterior/posterolateral arthrodesis  & Transpedicular decompression with partial corpectomy, left T8 & T8-9 laminectomy, bilateral facetectomy. 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 Since discharge in May 2024 pt has represented to ED for back pain x3 and for fall x1.    PT Comments  Pt received in bed, tired, reports he does not feel well, did better once he got up and moving. Pt continue to have chest and RLE discomfort at incisions. Pt also reports increased anxiety today and asks about taking an anxiety med in the day and not only before bed. Pt came to sitting EOB with CGA and increased time. Was able to stand from bed with CGA but needed mod A from lower surface of toilet. Ambulated in hallway with RW and min A with +2 for chair behind for seated rest break halfway. Pt able to verbalize sternal precautions as well as self monitor during ambulation. Has some knee hyperextension with fatigue that has been present since back surgery. Patient will benefit from continued inpatient follow up therapy, <3 hours/day. PT will continue to follow.    If plan is discharge home, recommend the following: Assist for transportation;Help with stairs or ramp for entrance;A little help with walking and/or transfers   Can travel by private vehicle     Yes  Equipment Recommendations  None recommended by PT     Recommendations for Other Services       Precautions / Restrictions Precautions Precautions: Fall;Sternal Precaution Booklet Issued: No Precaution Comments: pt able to state sternal precautions Restrictions Weight Bearing Restrictions Per Provider Order: Yes RUE Weight Bearing Per Provider Order: Non weight bearing LUE Weight Bearing Per Provider Order: Non weight bearing Other Position/Activity Restrictions: sternal precautions     Mobility  Bed Mobility Overal bed mobility: Needs Assistance Bed Mobility: Sidelying to Sit, Rolling Rolling: Supervision Sidelying to sit: HOB elevated, Contact guard assist       General bed mobility comments: worked on scooting one hip fwd at a time without use of UE's for increased independence.    Transfers Overall transfer level: Needs assistance Equipment used: None Transfers: Sit to/from Stand Sit to Stand: Mod assist, Min assist (without an AD; hugging heart pillow)           General transfer comment: min A from recliner and bed. Mod A from low toilet    Ambulation/Gait Ambulation/Gait assistance: Min assist, +2 safety/equipment Gait Distance (Feet): 400 Feet Assistive device: Rolling walker (2 wheels) Gait Pattern/deviations: Step-through pattern, Wide base of support, Decreased stride length Gait velocity: decreased Gait velocity interpretation: <1.8 ft/sec, indicate of risk for recurrent falls   General Gait Details: chair brought behind for rest break but pt showed good self monitoring and could have verbalized when he needed to sit in time to get chair if needed. Min A for safety, much more erect posture than visits with this same PT last admission. Continues to lack knee control when fatigued with occasional  hyperextension   Stairs             Wheelchair Mobility     Tilt Bed    Modified Rankin (Stroke Patients Only)       Balance Overall balance assessment: Needs assistance Sitting-balance  support: No upper extremity supported, Feet supported Sitting balance-Leahy Scale: Fair     Standing balance support: Single extremity supported, Bilateral upper extremity supported, During functional activity, Reliant on assistive device for balance Standing balance-Leahy Scale: Fair Standing balance comment: worked on standing at sink without holding on. Min A for safety but no LOB                            Cognition Arousal: Alert Behavior During Therapy: Anxious Overall Cognitive Status: Within Functional Limits for tasks assessed                                 General Comments: pt with good self monitoring throughout session. Sometimes becomes very focused on pain but easily redirected with conversation        Exercises      General Comments General comments (skin integrity, edema, etc.): VSS on RA. HR in 90's with ambulation. SPO2 mid 90's.      Pertinent Vitals/Pain Pain Assessment Pain Assessment: Faces Faces Pain Scale: Hurts even more Pain Location: sternum; RLE Pain Descriptors / Indicators: Grimacing, Guarding, Discomfort, Aching, Sore, Other (Comment) (Itching at incision site) Pain Intervention(s): Limited activity within patient's tolerance, Monitored during session    Home Living                          Prior Function            PT Goals (current goals can now be found in the care plan section) Acute Rehab PT Goals Patient Stated Goal: get home and back to rehab PT Goal Formulation: With patient Time For Goal Achievement: 05/03/23 Potential to Achieve Goals: Good Progress towards PT goals: Progressing toward goals    Frequency    Min 1X/week      PT Plan      Co-evaluation              AM-PAC PT "6 Clicks" Mobility   Outcome Measure  Help needed turning from your back to your side while in a flat bed without using bedrails?: A Little Help needed moving from lying on your back to sitting on the  side of a flat bed without using bedrails?: A Little Help needed moving to and from a bed to a chair (including a wheelchair)?: A Lot Help needed standing up from a chair using your arms (e.g., wheelchair or bedside chair)?: A Lot Help needed to walk in hospital room?: A Lot Help needed climbing 3-5 steps with a railing? : Total 6 Click Score: 13    End of Session Equipment Utilized During Treatment: Gait belt Activity Tolerance: Patient tolerated treatment well Patient left: with call bell/phone within reach;in chair;with nursing/sitter in room Nurse Communication: Mobility status PT Visit Diagnosis: Unsteadiness on feet (R26.81);Difficulty in walking, not elsewhere classified (R26.2);Other symptoms and signs involving the nervous system (R29.898);Pain Pain - Right/Left: Right Pain - part of body: Leg (sternum)     Time: 1240-1309 PT Time Calculation (min) (ACUTE ONLY): 29 min  Charges:    $Gait Training: 23-37 mins PT General  Charges $$ ACUTE PT VISIT: 1 Visit                     Lyanne Co, PT  Acute Rehab Services Secure chat preferred Office [phone]    Elyse Hsu 05/01/2023, 2:28 PM

## 2023-05-01 NOTE — Progress Notes (Signed)
[address]       [place] [address]             [phone]      13 Days Post-Op Procedure(s) (LRB): CORONARY ARTERY BYPASS GRAFTING (CABG) TIMES FOUR USING LEFT INTERNAL MAMMARY ARTERY AND ENDOSCOPICALLY HARVESTED LEFT GREATER SAPHENOUS VEIN (N/A) CLIPPING OF ATRIAL APPENDAGE USING ATRICURE ATRICLIP [id number] with pulmonary vein isolation ablation (N/A) TRANSESOPHAGEAL ECHOCARDIOGRAM (TEE) (N/A) Subjective: Resting in bed. Walked in the hall x 3 on Saturday and x 1 yesterday. Pain control reasonable. BM x 2 yesterday.   Awaiting insurance authorization for [facility].  Objective: Vital signs in last 24 hours: Temp:  [97.6 F (36.4 C)-98.3 F (36.8 C)] 98.3 F (36.8 C) (01/27 0446) Pulse Rate:  [74-85] 85 (01/27 0446) Cardiac Rhythm: Ventricular tachycardia (01/26 1911) Resp:  [15-20] 20 (01/27 0446) BP: (94-145)/(63-80) 145/65 (01/27 0446) SpO2:  [96 %-100 %] 98 % (01/26 2320) Weight:  [85 kg] 85 kg (01/27 0446)      Intake/Output from previous day: 01/26 0701 - 01/27 0700 In: -  Out: 1950 [Urine:1950] Intake/Output this shift: No intake/output data recorded.  General appearance: alert, cooperative, and no distress Neurologic: intact Heart: irregularly irregular rhythm Lungs: breath sounds CTA, normal WOB on RA.  Abdomen: soft, no tenderness. Extremities: 1+ LE edema Wound: the sternotomy incision and left LE EVH incisions are intact and dry.   Lab Results: No results for input(s): "WBC", "HGB", "HCT", "PLT" in the last 72 hours. BMET:  Recent Labs    04/30/23 0254 05/01/23 0304  NA 130* 128*  K 4.7 4.2  CL 95* 94*  CO2 28 24  GLUCOSE 160* 179*  BUN 60* 57*  CREATININE 2.50* 2.24*  CALCIUM 8.7* 8.6*    PT/INR: No results for input(s): "LABPROT", "INR" in the last 72 hours. ABG    Component Value Date/Time   PHART 7.345 (L) 04/19/2023 1652   HCO3 19.3 (L) 04/19/2023 1652   TCO2 20 (L) 04/19/2023 1652   ACIDBASEDEF 6.0  (H) 04/19/2023 1652   O2SAT 98 04/19/2023 1652   CBG (last 3)  Recent Labs    04/30/23 1626 04/30/23 2133 05/01/23 0634  GLUCAP 179* 133* 158*    Assessment/Plan: S/P Procedure(s) (LRB): CORONARY ARTERY BYPASS GRAFTING (CABG) TIMES FOUR USING LEFT INTERNAL MAMMARY ARTERY AND ENDOSCOPICALLY HARVESTED LEFT GREATER SAPHENOUS VEIN (N/A) CLIPPING OF ATRIAL APPENDAGE USING ATRICURE ATRICLIP [id number] with pulmonary vein isolation ablation (N/A) TRANSESOPHAGEAL ECHOCARDIOGRAM (TEE) (N/A)  -POD 13 CABG x 4, LAA clip, and Pulmonary Vein Isolation for NSTEMI with EF 45% and chronic persistent atrial fibrillation.  On ASA, Crestor, and Coreg.    -Chronic A-fib- good rate control on carvedilol. Apixaban resumed. Had an 8-beat run of VT last evening. K+ 4.2 and Mg++ 1.8.  Will replace Mg++ and continue to monitor.   -GI- post-op ileus, likely related to long-term opioid use. Having bowel function and tolerating PO's. Abd exam benign. Continue bowel regimen.    -AKI on CKD- renal function stable and is diuresing. Wt is still ~12lbs+.  Continue Lasix at 80mg  PO BID for now. Add Zaroxolyn x 1 today. Monitor K+ and Na+.   -ENDO- type 2 DM- on Semglee 27u daily and Novalog 4U CC. Control is fair.  Continue to monitor and provide SSI coverage. Resume his Marcelline Deist at discharge.    -Chronic pain- has had multi-level spinal fusion following vertebral osteomyelitis with MRSA. On chronic suppressive therapy with doxycycline 100mg  BID  and on Robaxin and gabapentin for chronic pain.    -Disposition- He has accepted a bed offer at [facility], awaiting insurance authorization.     LOS: 21 days    Leary Roca, PA-C 05/01/2023

## 2023-05-01 NOTE — TOC Progression Note (Signed)
Transition of Care [facility]) - Progression Note    Patient Details  Name: PACEN WATFORD MRN: [id number] Date of Birth: [birth year]  Transition of Care [facility]) CM/SW Contact  Eduard Roux, LCSW Phone Number: 05/01/2023, 10:27 AM  Clinical Narrative:     Sherron Monday with Lacinda Axon Admissions/Krystal- requested to start authorization for SNF.  SNF will start auth today.  Antony Blackbird, MSW, LCSW Clinical Social Worker    Expected Discharge Plan: Skilled Nursing Facility Barriers to Discharge: Continued Medical Work up  Expected Discharge Plan and Services In-house Referral: Clinical Social Work Discharge Planning Services: CM Consult Post Acute Care Choice: NA Living arrangements for the past 2 months: Single Family Home                 DME Arranged: N/A DME Agency: NA       HH Arranged: NA           Social Determinants of Health (SDOH) Interventions SDOH Screenings   Food Insecurity: Food Insecurity Present (04/17/2023)  Housing: High Risk (04/17/2023)  Transportation Needs: Unmet Transportation Needs (04/17/2023)  Utilities: Not At Risk (04/10/2023)  Alcohol Screen: Low Risk  (03/01/2023)  Depression (PHQ2-9): High Risk (12/29/2022)  Financial Resource Strain: Medium Risk (03/01/2023)  Physical Activity: Insufficiently Active (03/01/2023)  Social Connections: Unknown (03/01/2023)  Stress: Stress Concern Present (03/01/2023)  Tobacco Use: Medium Risk (04/18/2023)    Readmission Risk Interventions    09/06/2021   12:07 PM  Readmission Risk Prevention Plan  Post Dischage Appt Complete  Medication Screening Complete  Transportation Screening Complete

## 2023-05-01 NOTE — Plan of Care (Signed)
  Problem: Education: Goal: Knowledge of General Education information will improve Description: Including pain rating scale, medication(s)/side effects and non-pharmacologic comfort measures Outcome: Progressing   Problem: Health Behavior/Discharge Planning: Goal: Ability to manage health-related needs will improve Outcome: Progressing   Problem: Clinical Measurements: Goal: Ability to maintain clinical measurements within normal limits will improve Outcome: Progressing Goal: Will remain free from infection Outcome: Progressing Goal: Diagnostic test results will improve Outcome: Progressing Goal: Respiratory complications will improve Outcome: Progressing Goal: Cardiovascular complication will be avoided Outcome: Progressing   Problem: Clinical Measurements: Goal: Ability to maintain clinical measurements within normal limits will improve Outcome: Progressing Goal: Will remain free from infection Outcome: Progressing Goal: Diagnostic test results will improve Outcome: Progressing Goal: Respiratory complications will improve Outcome: Progressing Goal: Cardiovascular complication will be avoided Outcome: Progressing   Problem: Clinical Measurements: Goal: Cardiovascular complication will be avoided Outcome: Progressing   Problem: Clinical Measurements: Goal: Respiratory complications will improve Outcome: Progressing   Problem: Activity: Goal: Risk for activity intolerance will decrease Outcome: Progressing   Problem: Coping: Goal: Level of anxiety will decrease Outcome: Progressing

## 2023-05-02 LAB — BASIC METABOLIC PANEL
Anion gap: 12 (ref 5–15)
BUN: 61 mg/dL — ABNORMAL HIGH (ref 8–23)
CO2: 25 mmol/L (ref 22–32)
Calcium: 9 mg/dL (ref 8.9–10.3)
Chloride: 92 mmol/L — ABNORMAL LOW (ref 98–111)
Creatinine, Ser: 2.13 mg/dL — ABNORMAL HIGH (ref 0.61–1.24)
GFR, Estimated: 35 mL/min — ABNORMAL LOW (ref >60)
Glucose, Bld: 174 mg/dL — ABNORMAL HIGH (ref 70–99)
Potassium: 4.1 mmol/L (ref 3.5–5.1)
Sodium: 129 mmol/L — ABNORMAL LOW (ref 135–145)

## 2023-05-02 LAB — GLUCOSE, CAPILLARY
Glucose-Capillary: 133 mg/dL — ABNORMAL HIGH (ref 70–99)
Glucose-Capillary: 81 mg/dL (ref 70–99)

## 2023-05-02 LAB — MAGNESIUM: Magnesium: 1.7 mg/dL (ref 1.7–2.4)

## 2023-05-02 MED ORDER — ASPIRIN 81 MG PO TBEC: 81.0000 mg | DELAYED_RELEASE_TABLET | Freq: Every day | ORAL | Status: DC

## 2023-05-02 MED ORDER — GABAPENTIN 300 MG PO CAPS: 300.0000 mg | ORAL_CAPSULE | Freq: Three times a day (TID) | ORAL | Status: DC

## 2023-05-02 MED ORDER — OXYCODONE HCL ER 10 MG PO T12A: 10.0000 mg | EXTENDED_RELEASE_TABLET | Freq: Two times a day (BID) | ORAL | 0 refills | Status: DC

## 2023-05-02 MED ORDER — ALPRAZOLAM 0.5 MG PO TABS: 0.5000 mg | ORAL_TABLET | Freq: Every evening | ORAL | 0 refills | Status: DC | PRN

## 2023-05-02 MED ORDER — GABAPENTIN 300 MG PO CAPS
300.0000 mg | ORAL_CAPSULE | Freq: Three times a day (TID) | ORAL | Status: DC
  Administered 2023-05-02: 300 mg via ORAL
  Filled 2023-05-02: qty 1

## 2023-05-02 MED ORDER — FAMOTIDINE 20 MG PO TABS: 20.0000 mg | ORAL_TABLET | Freq: Every day | ORAL | Status: DC

## 2023-05-02 MED ORDER — HYDROCODONE-ACETAMINOPHEN 10-325 MG PO TABS: 1.0000 | ORAL_TABLET | ORAL | 0 refills | Status: DC | PRN

## 2023-05-02 MED ORDER — MAGNESIUM SULFATE 2 GM/50ML IV SOLN
2.0000 g | Freq: Once | INTRAVENOUS | Status: AC
Start: 2023-05-02 — End: 2023-05-02
  Administered 2023-05-02: 2 g via INTRAVENOUS
  Filled 2023-05-02: qty 50

## 2023-05-02 MED ORDER — SENNA 8.6 MG PO TABS: 2.0000 | ORAL_TABLET | Freq: Two times a day (BID) | ORAL | Status: DC

## 2023-05-02 MED ORDER — METOLAZONE 5 MG PO TABS
5.0000 mg | ORAL_TABLET | Freq: Once | ORAL | Status: AC
  Administered 2023-05-02: 5 mg via ORAL
  Filled 2023-05-02: qty 1

## 2023-05-02 MED ORDER — DOCUSATE SODIUM 100 MG PO CAPS: 200.0000 mg | ORAL_CAPSULE | Freq: Two times a day (BID) | ORAL | Status: DC

## 2023-05-02 MED ORDER — EZETIMIBE 10 MG PO TABS: 10.0000 mg | ORAL_TABLET | Freq: Every day | ORAL | Status: DC

## 2023-05-02 MED ORDER — POLYETHYLENE GLYCOL 3350 17 G PO PACK: 17.0000 g | PACK | Freq: Two times a day (BID) | ORAL | Status: DC

## 2023-05-02 MED ORDER — METHOCARBAMOL 750 MG PO TABS: 750.0000 mg | ORAL_TABLET | Freq: Three times a day (TID) | ORAL | Status: DC | PRN

## 2023-05-02 MED ORDER — FUROSEMIDE 40 MG PO TABS: 40.0000 mg | ORAL_TABLET | Freq: Every day | ORAL | Status: DC

## 2023-05-02 MED ORDER — MAGNESIUM SULFATE 2 GM/50ML IV SOLN
2.0000 g | Freq: Once | INTRAVENOUS | Status: AC
  Administered 2023-05-02: 2 g via INTRAVENOUS
  Filled 2023-05-02: qty 50

## 2023-05-02 NOTE — TOC Transition Note (Signed)
Transition of Care [facility]) - Discharge Note   Patient Details  Name: David [name] MRN: [id number] Date of Birth: September 13, 1960  [facility] of [facility]) CM/SW Contact:  Eduard Roux, LCSW Phone Number: 05/02/2023, 12:28 PM   Clinical Narrative:     Patient will Discharge to: [facility] Discharge Date: 05/02/2023 Family Notified: spouse-LVM Transport By: Sharin Mons  Per MD patient is ready for discharge. RN, patient, and facility notified of discharge. Discharge Summary sent to facility. RN given number for [phone], Room 211. Ambulance transport requested for patient.   Clinical Social Worker signing off.  Antony Blackbird, MSW, LCSW Clinical Social Worker     Final next level of care: Skilled Nursing Facility Barriers to Discharge: Barriers Resolved   Patient Goals and CMS Choice Patient states their goals for this hospitalization and ongoing recovery are:: SNF CMS Medicare.gov Compare Post Acute Care list provided to:: Patient Choice offered to / list presented to : Patient      Discharge Placement              Patient chooses bed at: [facility] Patient to be transferred to facility by: [facility] Name of family member notified: spouse- left voice message Patient and family notified of of transfer: 05/02/23  Discharge Plan and Services Additional resources added to the After Visit Summary for   In-house Referral: Clinical Social Work Discharge Planning Services: CM Consult Post Acute Care Choice: NA          DME Arranged: N/A DME Agency: NA       HH Arranged: NA          Social Drivers of Health (SDOH) Interventions SDOH Screenings   Food Insecurity: Food Insecurity Present (04/17/2023)  Housing: High Risk (04/17/2023)  Transportation Needs: Unmet Transportation Needs (04/17/2023)  Utilities: Not At Risk (04/10/2023)  Alcohol Screen: Low Risk  (03/01/2023)  Depression (PHQ2-9): High Risk (12/29/2022)  Financial Resource Strain: Medium Risk  (03/01/2023)  Physical Activity: Insufficiently Active (03/01/2023)  Social Connections: Unknown (03/01/2023)  Stress: Stress Concern Present (03/01/2023)  Tobacco Use: Medium Risk (04/18/2023)     Readmission Risk Interventions    09/06/2021   12:07 PM  Readmission Risk Prevention Plan  Post Dischage Appt Complete  Medication Screening Complete  Transportation Screening Complete

## 2023-05-02 NOTE — Progress Notes (Signed)
[address]       [place] [address]             [phone]      14 Days Post-Op Procedure(s) (LRB): CORONARY ARTERY BYPASS GRAFTING (CABG) TIMES FOUR USING LEFT INTERNAL MAMMARY ARTERY AND ENDOSCOPICALLY HARVESTED LEFT GREATER SAPHENOUS VEIN (N/A) CLIPPING OF ATRIAL APPENDAGE USING ATRICURE ATRICLIP [id number] with pulmonary vein isolation ablation (N/A) TRANSESOPHAGEAL ECHOCARDIOGRAM (TEE) (N/A) Subjective: Sitting up in bed eating breakfast. Complains of back pain that is controlled by the Norco for only 2 hours after each dose.   Awaiting insurance authorization for [facility].  Objective: Vital signs in last 24 hours: Temp:  [97.6 F (36.4 C)-98.3 F (36.8 C)] 98.1 F (36.7 C) (01/28 0549) Pulse Rate:  [62-77] 65 (01/28 0549) Cardiac Rhythm: Atrial fibrillation (01/27 1935) Resp:  [13-20] 13 (01/28 0549) BP: (78-127)/(52-81) 94/59 (01/28 0549) SpO2:  [96 %-100 %] 98 % (01/28 0549)      Intake/Output from previous day: 01/27 0701 - 01/28 0700 In: -  Out: 2525 [Urine:2525] Intake/Output this shift: No intake/output data recorded.  General appearance: alert, cooperative, and no distress Neurologic: intact Heart: irregularly irregular rhythm, remains in rate-controlled a-fib Lungs: breath sounds CTA, normal WOB on RA.  Abdomen: soft, no tenderness. Extremities: 1-2+ LE edema Wound: the sternotomy incision and left LE EVH incisions are intact and dry.   Lab Results: No results for input(s): "WBC", "HGB", "HCT", "PLT" in the last 72 hours. BMET:  Recent Labs    05/01/23 0304 05/02/23 0315  NA 128* 129*  K 4.2 4.1  CL 94* 92*  CO2 24 25  GLUCOSE 179* 174*  BUN 57* 61*  CREATININE 2.24* 2.13*  CALCIUM 8.6* 9.0    PT/INR: No results for input(s): "LABPROT", "INR" in the last 72 hours. ABG    Component Value Date/Time   PHART 7.345 (L) 04/19/2023 1652   HCO3 19.3 (L) 04/19/2023 1652   TCO2 20 (L) 04/19/2023 1652    ACIDBASEDEF 6.0 (H) 04/19/2023 1652   O2SAT 98 04/19/2023 1652   CBG (last 3)  Recent Labs    05/01/23 1816 05/01/23 2055 05/02/23 0540  GLUCAP 143* 184* 133*    Assessment/Plan: S/P Procedure(s) (LRB): CORONARY ARTERY BYPASS GRAFTING (CABG) TIMES FOUR USING LEFT INTERNAL MAMMARY ARTERY AND ENDOSCOPICALLY HARVESTED LEFT GREATER SAPHENOUS VEIN (N/A) CLIPPING OF ATRIAL APPENDAGE USING ATRICURE ATRICLIP [id number] with pulmonary vein isolation ablation (N/A) TRANSESOPHAGEAL ECHOCARDIOGRAM (TEE) (N/A)  -POD 14 CABG x 4, LAA clip, and Pulmonary Vein Isolation for NSTEMI with EF 45% and chronic persistent atrial fibrillation.  On ASA, Crestor, and Coreg. Will remove the chest tube sutures today.   -Chronic A-fib- good rate control on carvedilol. Apixaban resumed. No furhter VT. K+ 4.1 and Mg++ 1.7.  Will replace Mg++ again today and continue to monitor.   -GI- post-op ileus, likely related to long-term opioid use. Having bowel function and tolerating PO's. Abd exam benign. Continue bowel regimen.    -AKI on CKD- renal function stable and is diuresing. Wt is still ~12lbs+ despite 2.5L output yesterday .  Continue Lasix at 80mg  PO BID for now. Repeat Zaroxolyn x 1 today. Monitor K+ and Na+.   -ENDO- type 2 DM- on Semglee 27u daily and Novalog 4U CC. Control is reasonable.  Continue to monitor and provide SSI coverage. Resume his Marcelline Deist at discharge.    -Chronic pain- has had multi-level spinal fusion following vertebral osteomyelitis with MRSA. Says pain is  poorly controlled on his usual analgesic regimen.  Will increase the gabapentin to 300mg  TID.  On chronic suppressive therapy with doxycycline 100mg  BID and on Robaxin for chronic pain.    -Disposition- He has accepted a bed offer at [facility], awaiting insurance authorization.     LOS: 22 days    Leary Roca, PA-C 05/02/2023

## 2023-05-02 NOTE — Progress Notes (Signed)
Attempted to call report to receiving RN at [facility], however, was unable to complete report at this time.

## 2023-05-02 NOTE — Progress Notes (Signed)
CARDIAC REHAB PHASE I   Pt has orders for discharge. Postop [facility] education completed. Referral for CRP2 sent to [facility].   Woodroe Chen, RN BSN 05/02/2023 10:42 AM

## 2023-05-02 NOTE — Progress Notes (Signed)
Chest tube sutures removed per MD order without difficulty.

## 2023-05-02 NOTE — Plan of Care (Signed)
Problem: Elimination: Goal: Will not experience complications related to bowel motility Outcome: Progressing   Problem: Pain Management: Goal: General experience of comfort will improve Outcome: Progressing   Problem: Safety: Goal: Ability to remain free from injury will improve Outcome: Progressing   Problem: Skin Integrity: Goal: Risk for impaired skin integrity will decrease Outcome: Progressing

## 2023-05-02 NOTE — Progress Notes (Signed)
Report given to receiving RN at [facility]

## 2023-05-04 NOTE — Progress Notes (Deleted)
 Office Visit    Patient Name: David [name] Date of Encounter: 05/04/2023  PCP:  [name], MD   [facility]  Cardiologist:  Charlton Haws, MD  Advanced Practice Provider:  No care team member to display Electrophysiologist:  None   HPI    David [name] is a 63 y.o. male with a hx of diabetes mellitus, persistent atrial fibrillation (failed DCCV 09/2017 and attempted sotalol with plan to repeat DCCV; DCCV not done and patient stopped meds), HFrEF (Myoview in 2019 with mild anterior lateral ischemia), suspected OSA, hypertension presents today for follow-up appointment.    He was admitted 10/2018 with AF with RVR and low EF.  The plan at that time was to attempt rate control and repeat echocardiogram 3 months later to check for improvement.  If his EF did not improve, he would need ischemic eval.  He was lost to follow-up at this time.  He was seen in the clinic in 3/22 and was supposed to return in 3 months.  He returned to the office 3/23 for follow-up visit with Tereso Newcomer, PA-C.  He was complaining of fatigue and lethargy for about a year.  He had some shortness of breath when he climbs stairs but no chest pain, syncope, orthopnea, leg edema.  He does not smoke.  He was seen by me 7/23,, he recently had his toe amputated by Dr. [name] due to diabetes.  He states that his toe was infected and it spread his blood.  He had MRSA in his blood and is now on IV antibiotics for 6 weeks through a PICC line.  He states that he has not been very active over the past few weeks because of his toe.  He is a Investment banker, operational for work and hopes that Dr. [name] will clear him on Friday to go back to work.  Due to all this going on, this was why he was never scheduled for his [employer].  I asked the patient to asked Dr. [name] when it would be appropriate to move forward with this test given his current condition.  Before his toe amputation, he admits to not taking his insulin like he should  and not eating like he should.  Recent A1c was over 11.  He has however lost 60 pounds over the last year and has tried to make healthier choices lately.  Once he recovers from his toe surgery he plans to be more active.  He had a new onset diagnosis of atrial fibrillation with a failed cardioversion.  He was supposed to see EP and the appointment was never scheduled.  We will plan for him to follow-up with EP today.  He may be an ablation candidate.  He was also recently started on a statin due to his cholesterol levels when he was in the hospital.  We have arranged for him to get follow-up labs in 8 weeks to check a lipid panel.  He does have some shortness of breath with exertion but this could also be due to deconditioning.  Echocardiogram from his hospitalization reviewed with the patient.  He was seen by me 07/01/22, he tells me that he had his toe amputation and then ended up with more infection on the bottom of his foot.  They told him he did not have enough blood flow to heal it up.  They went into his leg and opened some arteries and it was still not healing.  They told him that  he was in our practice with amputated.  He went to get a second opinion at the wound center at [facility].  They debrided that but did not cast every week.  It did not have stopped healing and ultimately he got an amputation of his pinky toe on that side.  Now he is finally able to walk and able to go back to work.  Heart: He wants to get this atrial fibrillation under control.  He is in A-fib with RVR rate 106 bpm.  He needs another cardioversion. We have asked him to not miss a dose of Eliquis.  He is a Investment banker, operational and gets a lot of exercise at work.  He has to do some heavy lifting and is always on the go.  No drinking or smoking.    Reports no shortness of breath nor dyspnea on exertion. Reports no chest pain, pressure, or tightness. No edema, orthopnea, PND.   Today, he ***   Past Medical History    Past Medical History:   Diagnosis Date   Cardiomyopathy (HCC)    a. EF 45% in 2019.   CHF (congestive heart failure) (HCC)    Chronic anticoagulation 09/30/2017   Diabetes mellitus type 2 in nonobese [facility])    Discitis of thoracic region 09/25/2022   Does not have health insurance    Essential hypertension 09/26/2017   Financial difficulties    H/O noncompliance with medical treatment, presenting hazards to health    Hardware complicating wound infection (HCC) 09/25/2022   Non-insulin treated type 2 diabetes mellitus (HCC) 09/26/2017   Persistent atrial fibrillation (HCC)    Sleep apnea suspected 09/30/2017   Past Surgical History:  Procedure Laterality Date   ABDOMINAL AORTOGRAM W/LOWER EXTREMITY N/A 09/02/2021   Procedure: ABDOMINAL AORTOGRAM W/LOWER EXTREMITY;  Surgeon: Nada Libman, MD;  Location: [facility];  Service: Cardiovascular;  Laterality: N/A;   ABDOMINAL AORTOGRAM W/LOWER EXTREMITY N/A 12/28/2021   Procedure: ABDOMINAL AORTOGRAM W/LOWER EXTREMITY;  Surgeon: Nada Libman, MD;  Location: [facility];  Service: Cardiovascular;  Laterality: N/A;   ABDOMINAL AORTOGRAM W/LOWER EXTREMITY N/A 07/28/2022   Procedure: ABDOMINAL AORTOGRAM W/LOWER EXTREMITY;  Surgeon: Cephus Shelling, MD;  Location: [facility];  Service: Cardiovascular;  Laterality: N/A;   AMPUTATION Right 09/04/2021   Procedure: 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;  Surgeon: Nadara Mustard, MD;  Location: [facility] OR;  Service: Orthopedics;  Laterality: Right;   AMPUTATION Right 08/02/2022   Procedure: AMPUTATION RAY 5TH METATARSAL OF RIGHT FOOT;  Surgeon: [name], MD;  Location: [facility] OR;  Service: Orthopedics;  Laterality: Right;   APPENDECTOMY  1971   BONE BIOPSY Right 08/02/2022   Procedure: BONE BIOPSY OF 5TH METATARSAL RIGHT FOOT;  Surgeon: [name], MD;  Location: [facility] OR;  Service: Orthopedics;  Laterality: Right;   CARDIOVERSION N/A 09/28/2017   Procedure: CARDIOVERSION;  Surgeon: Thurmon Fair, MD;  Location: [facility] ENDOSCOPY;  Service: Cardiovascular;  Laterality: N/A;   CLIPPING OF ATRIAL APPENDAGE N/A 04/18/2023   Procedure: CLIPPING OF ATRIAL APPENDAGE USING ATRICURE ATRICLIP [id number] with pulmonary vein isolation ablation;  Surgeon: Alleen Borne, MD;  Location: [facility] OR;  Service: Open Heart Surgery;  Laterality: N/A;   CORONARY ARTERY BYPASS GRAFT N/A 04/18/2023   Procedure: CORONARY ARTERY BYPASS GRAFTING (CABG) TIMES FOUR USING LEFT INTERNAL MAMMARY ARTERY AND ENDOSCOPICALLY HARVESTED LEFT GREATER SAPHENOUS VEIN;  Surgeon: Alleen Borne, MD;  Location: [facility] OR;  Service: Open Heart Surgery;  Laterality: N/A;  IRRIGATION AND DEBRIDEMENT FOOT  08/02/2022   Procedure: IRRIGATION AND DEBRIDEMENT RIGHT 5TH METATARSAL FOOT;  Surgeon: [name], MD;  Location: [facility] OR;  Service: Orthopedics;;   LAMINECTOMY WITH POSTERIOR LATERAL ARTHRODESIS LEVEL 4 N/A 08/30/2022   Procedure: THORACIC FIVE-THORACIC ELEVEN FUSION, THORACIC EIGHT TRANSPEDICULAR DECOMPRESSION AND PARTIAL CORPECTOMY with O-Arm;  Surgeon: Bedelia Person, MD;  Location: [facility] OR;  Service: Neurosurgery;  Laterality: N/A;   LEFT HEART CATH AND CORONARY ANGIOGRAPHY N/A 04/12/2023   Procedure: LEFT HEART CATH AND CORONARY ANGIOGRAPHY;  Surgeon: Kathleene Hazel, MD;  [facility]: [facility];  Service: Cardiovascular;  Laterality: N/A;   PERIPHERAL VASCULAR BALLOON ANGIOPLASTY  09/02/2021   Procedure: PERIPHERAL VASCULAR BALLOON ANGIOPLASTY;  Surgeon: Nada Libman, MD;  [facility];  Service: Cardiovascular;;   PERIPHERAL VASCULAR BALLOON ANGIOPLASTY  12/28/2021   Procedure: PERIPHERAL VASCULAR BALLOON ANGIOPLASTY;  Surgeon: Nada Libman, MD;  [facility];  Service: Cardiovascular;;  Posterior Tibial PTA only   TEE WITHOUT CARDIOVERSION N/A 09/28/2017   Procedure: TRANSESOPHAGEAL ECHOCARDIOGRAM (TEE);  Surgeon: Thurmon Fair, MD;  Location: [facility] ENDOSCOPY;  Service:  Cardiovascular;  Laterality: N/A;   TEE WITHOUT CARDIOVERSION N/A 09/03/2021   Procedure: TRANSESOPHAGEAL ECHOCARDIOGRAM (TEE);  Surgeon: Chilton Si, MD;  Location: [facility] ENDOSCOPY;  Service: Cardiovascular;  Laterality: N/A;   TEE WITHOUT CARDIOVERSION N/A 07/15/2022   Procedure: TRANSESOPHAGEAL ECHOCARDIOGRAM;  Surgeon: Thurmon Fair, MD;  [facility];  Service: Cardiovascular;  Laterality: N/A;   TEE WITHOUT CARDIOVERSION N/A 04/18/2023   Procedure: TRANSESOPHAGEAL ECHOCARDIOGRAM (TEE);  Surgeon: Alleen Borne, MD;  Location: [facility] OR;  Service: Open Heart Surgery;  Laterality: N/A;    Allergies  No Known Allergies   EKGs/Labs/Other Studies Reviewed:   The following studies were reviewed today:  09/03/2021 echocardiogram  IMPRESSIONS     1. Left ventricular ejection fraction, by estimation, is 60 to 65%. The  left ventricle has normal function. The left ventricle has no regional  wall motion abnormalities.   2. Right ventricular systolic function is normal. The right ventricular  size is normal.   3. No left atrial/left atrial appendage thrombus was detected.   4. The mitral valve is normal in structure. Trivial mitral valve  regurgitation. No evidence of mitral stenosis.   5. The aortic valve is tricuspid. Aortic valve regurgitation is not  visualized. No aortic stenosis is present.   6. The inferior vena cava is normal in size with greater than 50%  respiratory variability, suggesting right atrial pressure of 3 mmHg.   Conclusion(s)/Recommendation(s): Normal biventricular function without  evidence of hemodynamically significant valvular heart disease.   Echocardiogram 10/17/2018 EF 35-40, moderate LVH, moderately reduced RVSF, mild to moderate MR, RVSP 36.3   Myoview 09/29/2017 Mild mid anterolateral reversibility-equivocal finding, EF 44; intermediate risk Reviewed by Dr. Eden Emms - no infarct; ?mild ant-lat ischemia >> focus on Rx of AFib at that  time  EKG:  EKG is ordered today.  Atrial fibrillation with RVR, rate 106 bpm  Recent Labs: 09/04/2022: TSH 0.855 04/09/2023: ALT 15; B Natriuretic Peptide 419.7 04/28/2023: Hemoglobin 8.9; Platelets 248 05/02/2023: BUN 61; Creatinine, Ser 2.13; Magnesium 1.7; Potassium 4.1; Sodium 129  Recent Lipid Panel    Component Value Date/Time   CHOL 199 01/09/2023 0841   TRIG 235 (H) 01/09/2023 0841   HDL 36 (L) 01/09/2023 0841   CHOLHDL 5.9 (H) 12/14/2021 1025   CHOLHDL 6.6 09/03/2021 0314   VLDL 50 (H) 09/03/2021 1610  LDLCALC 121 (H) 01/09/2023 0841    Home Medications   No outpatient medications have been marked as taking for the 05/05/23 encounter (Appointment) with Sharlene Dory, PA-C.     Review of Systems      All other systems reviewed and are otherwise negative except as noted above.  Physical Exam    VS:  There were no vitals taken for this visit. , BMI There is no height or weight on file to calculate BMI.  Wt Readings from Last 3 Encounters:  05/01/23 187 lb 8 oz (85 kg)  03/20/23 170 lb (77.1 kg)  03/12/23 175 lb (79.4 kg)     GEN: Well nourished, well developed, in no acute distress. HEENT: normal. Neck: Supple, no JVD, carotid bruits, or masses. Cardiac: Irregularly irregular, no murmurs, rubs, or gallops. No clubbing, cyanosis, edema.  Radials/PT 2+ and equal bilaterally.  Respiratory:  Respirations regular and unlabored, clear to auscultation bilaterally. GI: Soft, nontender, nondistended. MS: No deformity or atrophy. Skin: Warm and dry, no rash. Neuro:  Strength and sensation are intact. Psych: Normal affect.  Assessment & Plan    HFrEF -Continue current medications including Eliquis 5 twice a day, aspirin 81 mg daily, Lipitor 80 mg daily, carvedilol increased to 6.25 mg twice a day, Plavix 75 g daily, Entresto 49 g twice a day, spironolactone 25 mg daily -Consider SGLT2 inhibitors once diabetes is better controlled.  Recent A1c 11.2 -Echocardiogram from  09/03/2021 with LVEF 60 to 65%, no significant valvular abnormality, no regional wall motion abnormalities. -Does not smoke and does not drink  2. Persistent atrial fibrillation -DCCV previously and needs another one -Today, rate 106 bpm -He is asymptomatic -Will obtain follow-up with EP -increase coreg to 6.25 BID -Ablation vs. Another DCCV  3. Essential HTN -since 63 years old -Well-controlled today -Continue current medication regimen  4. Insulin-dependent type 2 DM -Working with PCP to get better control of his sugars -A1C 11.2 as of 08/30/21   5. Hypercholesterolemia -Last lipid panel with LDL 142, HDL 36, triglycerides 191 -Triglycerides are better but LDL remains above goal -Probably needs to go back on his Setia at his next appointment.  {The patient has an active order for outpatient cardiac rehabilitation.   Please indicate if the patient is ready to start. Do NOT delete this.  It will auto delete.  Refresh note, then sign.              Click here to document readiness and see contraindications.  :1}  Cardiac Rehabilitation Eligibility Assessment     Disposition: Follow up 2 months with Charlton Haws, MD or APP.  Signed, Sharlene Dory, PA-C 05/04/2023, 1:05 PM [facility]

## 2023-05-05 ENCOUNTER — Ambulatory Visit: Payer: Medicaid Other | Admitting: Physical Therapy

## 2023-05-05 ENCOUNTER — Ambulatory Visit: Payer: Medicaid Other | Admitting: Physician Assistant

## 2023-05-05 DIAGNOSIS — I10 Essential (primary) hypertension: Secondary | ICD-10-CM

## 2023-05-05 DIAGNOSIS — I5022 Chronic systolic (congestive) heart failure: Secondary | ICD-10-CM

## 2023-05-05 DIAGNOSIS — Z79899 Other long term (current) drug therapy: Secondary | ICD-10-CM

## 2023-05-05 DIAGNOSIS — E119 Type 2 diabetes mellitus without complications: Secondary | ICD-10-CM

## 2023-05-05 DIAGNOSIS — I4819 Other persistent atrial fibrillation: Secondary | ICD-10-CM

## 2023-05-12 ENCOUNTER — Ambulatory Visit: Payer: Medicaid Other | Admitting: Physical Therapy

## 2023-05-13 ENCOUNTER — Emergency Department (HOSPITAL_BASED_OUTPATIENT_CLINIC_OR_DEPARTMENT_OTHER): Payer: Medicaid Other

## 2023-05-13 ENCOUNTER — Emergency Department (HOSPITAL_COMMUNITY): Payer: Medicaid Other

## 2023-05-13 ENCOUNTER — Encounter (HOSPITAL_COMMUNITY): Payer: Self-pay

## 2023-05-13 ENCOUNTER — Other Ambulatory Visit: Payer: Self-pay

## 2023-05-13 ENCOUNTER — Emergency Department (HOSPITAL_COMMUNITY)
Admission: EM | Admit: 2023-05-13 | Discharge: 2023-05-13 | Disposition: A | Discharge status: Home / Self Care | Payer: Medicaid Other | Attending: Emergency Medicine | Admitting: Emergency Medicine

## 2023-05-13 DIAGNOSIS — I2510 Atherosclerotic heart disease of native coronary artery without angina pectoris: Secondary | ICD-10-CM | POA: Insufficient documentation

## 2023-05-13 DIAGNOSIS — Z7984 Long term (current) use of oral hypoglycemic drugs: Secondary | ICD-10-CM | POA: Diagnosis not present

## 2023-05-13 DIAGNOSIS — R0789 Other chest pain: Secondary | ICD-10-CM | POA: Diagnosis not present

## 2023-05-13 DIAGNOSIS — Z794 Long term (current) use of insulin: Secondary | ICD-10-CM | POA: Diagnosis not present

## 2023-05-13 DIAGNOSIS — Z7901 Long term (current) use of anticoagulants: Secondary | ICD-10-CM | POA: Diagnosis not present

## 2023-05-13 DIAGNOSIS — R600 Localized edema: Secondary | ICD-10-CM | POA: Insufficient documentation

## 2023-05-13 DIAGNOSIS — Z79899 Other long term (current) drug therapy: Secondary | ICD-10-CM | POA: Insufficient documentation

## 2023-05-13 DIAGNOSIS — M79662 Pain in left lower leg: Secondary | ICD-10-CM

## 2023-05-13 DIAGNOSIS — I110 Hypertensive heart disease with heart failure: Secondary | ICD-10-CM | POA: Insufficient documentation

## 2023-05-13 DIAGNOSIS — I509 Heart failure, unspecified: Secondary | ICD-10-CM | POA: Diagnosis not present

## 2023-05-13 DIAGNOSIS — E119 Type 2 diabetes mellitus without complications: Secondary | ICD-10-CM | POA: Diagnosis not present

## 2023-05-13 DIAGNOSIS — Z7982 Long term (current) use of aspirin: Secondary | ICD-10-CM | POA: Insufficient documentation

## 2023-05-13 DIAGNOSIS — R079 Chest pain, unspecified: Secondary | ICD-10-CM

## 2023-05-13 LAB — CBC
HCT: 27 % — ABNORMAL LOW (ref 39.0–52.0)
Hemoglobin: 8.7 g/dL — ABNORMAL LOW (ref 13.0–17.0)
MCH: 31.6 pg (ref 26.0–34.0)
MCHC: 32.2 g/dL (ref 30.0–36.0)
MCV: 98.2 fL (ref 80.0–100.0)
Platelets: 203 10*3/uL (ref 150–400)
RBC: 2.75 MIL/uL — ABNORMAL LOW (ref 4.22–5.81)
RDW: 14.5 % (ref 11.5–15.5)
WBC: 7.3 10*3/uL (ref 4.0–10.5)
nRBC: 0 % (ref 0.0–0.2)

## 2023-05-13 LAB — BASIC METABOLIC PANEL
Anion gap: 12 (ref 5–15)
BUN: 31 mg/dL — ABNORMAL HIGH (ref 8–23)
CO2: 21 mmol/L — ABNORMAL LOW (ref 22–32)
Calcium: 8.8 mg/dL — ABNORMAL LOW (ref 8.9–10.3)
Chloride: 100 mmol/L (ref 98–111)
Creatinine, Ser: 1.98 mg/dL — ABNORMAL HIGH (ref 0.61–1.24)
GFR, Estimated: 38 mL/min — ABNORMAL LOW (ref >60)
Glucose, Bld: 130 mg/dL — ABNORMAL HIGH (ref 70–99)
Potassium: 3.6 mmol/L (ref 3.5–5.1)
Sodium: 133 mmol/L — ABNORMAL LOW (ref 135–145)

## 2023-05-13 LAB — TROPONIN I (HIGH SENSITIVITY)
Troponin I (High Sensitivity): 52 ng/L — ABNORMAL HIGH (ref <18)
Troponin I (High Sensitivity): 50 ng/L — ABNORMAL HIGH (ref <18)

## 2023-05-13 MED ORDER — ONDANSETRON HCL 4 MG/2ML IJ SOLN
4.0000 mg | Freq: Once | INTRAMUSCULAR | Status: AC
  Administered 2023-05-13: 4 mg via INTRAVENOUS
  Filled 2023-05-13: qty 2

## 2023-05-13 MED ORDER — FUROSEMIDE 10 MG/ML IJ SOLN
40.0000 mg | INTRAMUSCULAR | Status: AC
  Administered 2023-05-13: 40 mg via INTRAVENOUS
  Filled 2023-05-13: qty 4

## 2023-05-13 MED ORDER — MORPHINE SULFATE (PF) 4 MG/ML IV SOLN
4.0000 mg | Freq: Once | INTRAVENOUS | Status: AC
  Administered 2023-05-13: 4 mg via INTRAVENOUS
  Filled 2023-05-13: qty 1

## 2023-05-13 NOTE — ED Notes (Signed)
 Pt states he has an U/S of his stomach and leg yesterday. Chest xray was completed. He wants to know if the EDP can get the results instead of repeating tests again. EDP notified.   Pt has had increased swelling in abdomen and legs that hasn't improved with Lasix . Pt left leg is warm to touch, swollen, and redness travels behind thigh. Pt states he feels pain that travels up to his groin.

## 2023-05-13 NOTE — ED Notes (Signed)
 Patient transported to X-ray

## 2023-05-13 NOTE — ED Provider Notes (Signed)
  EMERGENCY DEPARTMENT AT [facility] Provider Note   CSN: [id number] Arrival date & time: 05/13/23  1012     History  Chief Complaint  Patient presents with   Chest Pain    David [name] is a 63 y.o. male with PMHx HTN, DM type 2, CHF, persistent afib which failed DCCV, chronic anticoagulation with Eliquis , HLD, CAD, MRSA osteomylitis with chronic pain and chonic infection supression with doxycycline , who presents to ED concerned for chest pain x2 hours. Patient with chronic SOB at baseline, but states that his SOB actually feels better today. Patient was given ASA and NTG by EMS and has had some resolution in his pain - currently 6-7/10. Patient also stating that he was diagnosed with PNA yesterday - but is on chronic doxy so no other abx were added. Patient stating that he has been having intermittent chest pain that is associated with the stress at his rehab facility. Chest pain occurs when he is arguing with the nursing staff and resolves afterwards. Patient stating that he is not receiving his medications at an appropriate time and sometimes the nursing staff is skipping his medication doses. Patient stating that he would prefer to be transferred to a new rehab facility.   Denies fever, nausea, vomiting, diarrhea, abdominal pain, cough, SOB.    Chest Pain      Home Medications Prior to Admission medications   Medication Sig Start Date End Date Taking? Authorizing Provider  Accu-Chek Softclix Lancets lancets Use to check blood sugar 3 times daily. 11/15/22   Newlin, Enobong, MD  ALPRAZolam  (XANAX ) 0.5 MG tablet Take 1 tablet (0.5 mg total) by mouth at bedtime as needed for anxiety or sleep. 05/02/23   Roddenberry, Myron G, PA-C  apixaban  (ELIQUIS ) 5 MG TABS tablet Take 1 tablet (5 mg total) by mouth 2 (two) times daily. 10/31/22   Waldron Pac, FNP  aspirin  EC 81 MG tablet Take 1 tablet (81 mg total) by mouth daily. Swallow whole. 05/03/23   Roddenberry, Myron G,  PA-C  Blood Glucose Monitoring Suppl (ACCU-CHEK GUIDE) w/Device KIT Use to check blood sugar 3 times daily. E11.9 11/15/22   Newlin, Enobong, MD  carvedilol  (COREG ) 3.125 MG tablet Take 1 tablet (3.125 mg total) by mouth 2 (two) times daily. 03/01/23   Newlin, Enobong, MD  Continuous Glucose Receiver (DEXCOM G7 RECEIVER) DEVI Use as directed. 03/10/23   Newlin, Enobong, MD  Continuous Glucose Sensor (DEXCOM G7 SENSOR) MISC Use as directed & change every 10 days. 03/10/23   Newlin, Enobong, MD  dapagliflozin  propanediol (FARXIGA ) 10 MG TABS tablet Take 1 tablet (10 mg total) by mouth daily before breakfast. 03/06/23   Newlin, Enobong, MD  docusate sodium  (COLACE) 100 MG capsule Take 2 capsules (200 mg total) by mouth 2 (two) times daily. 05/02/23   Roddenberry, Myron G, PA-C  doxycycline  (VIBRA -TABS) 100 MG tablet Take 1 tablet (100 mg total) by mouth every 12 (twelve) hours. 03/20/23   Dennise Kingsley, MD  ezetimibe  (ZETIA ) 10 MG tablet Take 1 tablet (10 mg total) by mouth daily. 05/03/23   Roddenberry, Myron G, PA-C  famotidine  (PEPCID ) 20 MG tablet Take 1 tablet (20 mg total) by mouth 2 times daily. 10/28/22   Jillian Buttery, MD  fluticasone  (FLONASE ) 50 MCG/ACT nasal spray Place 2 sprays into both nostrils daily. 10/21/22   Akula, Vijaya, MD  furosemide  (LASIX ) 40 MG tablet Take 1 tablet (40 mg total) by mouth daily. For 10 days. 05/02/23   Roddenberry, Myron  G, PA-C  gabapentin  (NEURONTIN ) 300 MG capsule Take 1 capsule (300 mg total) by mouth 3 (three) times daily. 05/02/23   Roddenberry, Myron G, PA-C  glucose blood (ACCU-CHEK GUIDE) test strip Use to check blood sugar 3 times daily. 11/15/22   Newlin, Enobong, MD  HYDROcodone -acetaminophen  (NORCO) 10-325 MG tablet Take 1 tablet by mouth every 4 (four) hours as needed for severe pain (pain score 7-10). 05/02/23   Roddenberry, Myron G, PA-C  insulin  glargine (LANTUS  SOLOSTAR) 100 UNIT/ML Solostar Pen Inject 27 Units into the skin daily. 03/01/23   Newlin,  Enobong, MD  Insulin  Pen Needle (PEN NEEDLES) 32G X 4 MM MISC Use to inject insulin  once daily. 11/15/22   Newlin, Enobong, MD  methocarbamol  (ROBAXIN ) 750 MG tablet Take 1 tablet (750 mg total) by mouth every 8 (eight) hours as needed for muscle spasms. 05/02/23   Roddenberry, Myron G, PA-C  Misc. 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 05/02/23   Roddenberry, Myron G, PA-C  polyethylene glycol (MIRALAX  / GLYCOLAX ) 17 g packet Take 17 g by mouth 2 (two) times daily. 05/02/23   Roddenberry, Myron G, PA-C  rosuvastatin  (CRESTOR ) 20 MG tablet Take 1 tablet (20 mg total) by mouth daily. 01/10/23   Newlin, Enobong, MD  senna (SENOKOT) 8.6 MG TABS tablet Take 2 tablets (17.2 mg total) by mouth 2 (two) times daily. 05/02/23   Roddenberry, Myron G, PA-C  tamsulosin  (FLOMAX ) 0.4 MG CAPS capsule Take 1 capsule (0.4 mg total) by mouth daily after supper. 03/01/23   Newlin, Enobong, MD  terbinafine  (LAMISIL ) 250 MG tablet Take 1 tablet (250 mg total) by mouth daily. 03/01/23   Newlin, Enobong, MD  XTAMPZA  ER 9 MG C12A Take 1 capsule by mouth every 12 (twelve) hours. 04/07/23   [provider]      Allergies    Patient has no known allergies.    Review of Systems   Review of Systems  Cardiovascular:  Positive for chest pain.    Physical Exam Updated Vital Signs BP (!) 144/107   Pulse 83   Temp 98 F (36.7 C) (Oral)   Resp 15   Ht 6' (1.829 m)   Wt 85 kg   SpO2 99%   BMI 25.43 kg/m  Physical Exam Vitals and nursing note reviewed.  Constitutional:      General: He  is not in acute distress.    Appearance: He is not ill-appearing or toxic-appearing.  HENT:     Head: Normocephalic and atraumatic.     Mouth/Throat:     Mouth: Mucous membranes are moist.     Pharynx: No oropharyngeal exudate or posterior oropharyngeal erythema.  Eyes:     General: No scleral icterus.       Right eye: No discharge.        Left eye: No discharge.     Conjunctiva/sclera: Conjunctivae normal.  Cardiovascular:     Rate and Rhythm: Normal rate and regular rhythm.     Pulses: Normal pulses.     Heart sounds:  Normal heart sounds. No murmur heard. Pulmonary:     Effort: Pulmonary effort is normal. No respiratory distress.     Breath sounds: Normal breath sounds. No wheezing, rhonchi or rales.  Chest:     Comments: Sternal incision site appear dry, clean, and intact.  Abdominal:     Tenderness: There is no abdominal tenderness.  Musculoskeletal:     Comments: Stripe of erythema along the great saphenous vein route on lower left medial leg. Erythema ending at 1cm incision site on medial lower leg just distal to left knee. Similar appearing to phlebitis.   +1 pitting edema BL.  Skin:    General: Skin is warm and dry.     Findings: No rash.  Neurological:     General: No focal deficit present.     Mental Status: He is alert. Mental status is at baseline.  Psychiatric:        Mood and Affect: Mood normal.     ED Results / Procedures / Treatments   Labs (all labs ordered are listed, but only abnormal results are displayed) Labs Reviewed  BASIC METABOLIC PANEL - Abnormal; Notable for the following components:      Result Value   Sodium 133 (*)    CO2 21 (*)    Glucose, Bld 130 (*)    BUN 31 (*)    Creatinine, Ser 1.98 (*)    Calcium  8.8 (*)    GFR, Estimated 38 (*)    All other components within normal limits  CBC - Abnormal; Notable for the following components:   RBC 2.75 (*)    Hemoglobin 8.7 (*)    HCT 27.0 (*)    All other components within normal  limits  TROPONIN I (HIGH SENSITIVITY) - Abnormal; Notable for the following components:   Troponin I (High Sensitivity) 52 (*)    All other components within normal limits  TROPONIN I (HIGH SENSITIVITY) - Abnormal; Notable for the following components:   Troponin I (High Sensitivity) 50 (*)    All other components within normal limits    EKG EKG Interpretation Date/Time:  Saturday May 13 2023 10:37:58 EST Ventricular Rate:  93 PR Interval:    QRS Duration:  108 QT Interval:  420 QTC Calculation: 523 R Axis:   83  Text Interpretation: Atrial fibrillation Non-specific ST-t changes No significant change since last tracing Confirmed by Bernard Drivers ([id number]) on 05/13/2023 10:43:25 AM  Radiology VAS US  LOWER EXTREMITY VENOUS (DVT) (7a-7p) Result Date: 05/13/2023  Lower Venous DVT Study Patient Name:  ALLISON SILVA  Date of Exam:   05/13/2023 Medical Rec #: [id number]      Accession #:    [id number] Date of Birth: 28-Dec-1960       Patient Gender: M Patient Age:   21 years Exam Location:  [facility] Procedure:      VAS US  LOWER EXTREMITY VENOUS (DVT) Referring Phys: [facility] Patsy Varma --------------------------------------------------------------------------------  Indications: Swelling, Pain, and Edema.  Risk Factors: Surgery CABG w/ LT GSV harvested 04/18/23. Comparison Study: None. Performing Technologist: Garnette Rockers  Examination Guidelines: A complete evaluation includes B-mode imaging, spectral Doppler, color Doppler, and power Doppler as needed of all accessible portions of each vessel. Bilateral testing is considered an integral part of a complete examination. Limited examinations for reoccurring indications may be performed as noted. The reflux portion of the exam is performed with the patient in reverse Trendelenburg.  +-----+---------------+---------+-----------+----------+--------------+ RIGHTCompressibilityPhasicitySpontaneityPropertiesThrombus Aging  +-----+---------------+---------+-----------+----------+--------------+ CFV  Full  Yes      Yes                                 +-----+---------------+---------+-----------+----------+--------------+   +---------+---------------+---------+-----------+----------+-------------------+ LEFT     CompressibilityPhasicitySpontaneityPropertiesThrombus Aging      +---------+---------------+---------+-----------+----------+-------------------+ CFV      Full           Yes      Yes                                      +---------+---------------+---------+-----------+----------+-------------------+ SFJ      Full                                                             +---------+---------------+---------+-----------+----------+-------------------+ FV Prox  Full                                                             +---------+---------------+---------+-----------+----------+-------------------+ FV Mid   Full                                                             +---------+---------------+---------+-----------+----------+-------------------+ FV Distal                        Yes                  Not well visualized +---------+---------------+---------+-----------+----------+-------------------+ PFV      Full                                                             +---------+---------------+---------+-----------+----------+-------------------+ POP      Full           Yes      Yes                                      +---------+---------------+---------+-----------+----------+-------------------+ PTV      Full                                                             +---------+---------------+---------+-----------+----------+-------------------+ PERO     Full                                                             +---------+---------------+---------+-----------+----------+-------------------+  Inflammation seen along the  path of the harvested left great saphenous vein from proximal thight through mid calf.   Summary: RIGHT: - No evidence of common femoral vein obstruction.   LEFT: - There is no evidence of deep vein thrombosis in the lower extremity.  - No cystic structure found in the popliteal fossa.  *See table(s) above for measurements and observations.    Preliminary    DG Chest 2 View Result Date: 05/13/2023 CLINICAL DATA:  Chest pain and swelling.  Recent cardiac bypass. EXAM: CHEST - 2 VIEW COMPARISON:  04/24/2023 FINDINGS: Cardiomediastinal silhouette and pulmonary vasculature are within normal limits. Minimal left basilar opacity likely due to atelectasis. Hyperexpansion of the lungs suspicious for emphysema. Postsurgical changes of CABG and thoracic spine fusion again seen. IMPRESSION: Mild left basilar atelectasis. Electronically Signed   By: Aliene Lloyd M.D.   On: 05/13/2023 11:50    Procedures Procedures    Medications Ordered in ED Medications  morphine  (PF) 4 MG/ML injection 4 mg (4 mg Intravenous Given 05/13/23 1320)  ondansetron  (ZOFRAN ) injection 4 mg (4 mg Intravenous Given 05/13/23 1320)  furosemide  (LASIX ) injection 40 mg (40 mg Intravenous Given 05/13/23 1449)    ED Course/ Medical Decision Making/ A&P                                 Medical Decision Making Amount and/or Complexity of Data Reviewed Labs: ordered. Radiology: ordered.  Risk Prescription drug management.   This patient presents to the ED for concern of chest pain, this involves an extensive number of treatment options, and is a complaint that carries with it a high risk of complications and morbidity.  The differential diagnosis includes acute coronary syndrome, congestive heart failure, pericarditis, pneumonia, pulmonary embolism, tension pneumothorax, esophageal rupture, aortic dissection, cardiac tamponade, musculoskeletal   Co morbidities that complicate the patient evaluation  HTN, DM type 2, CHF, persistent afib  which failed DCCV, chronic anticoagulation with Eliquis , HLD, CAD, MRSA osteomylitis with chronic pain and chonic infection supression with doxycycline    Additional history obtained:  Additional history obtained from Discharge summary 04/26/2023: Mr. Carolee remained stable following left heart catheterization. He was taken to the op room on 12/17/2023 where CABG x 4 placed utilizing a left internal mammary artery to the left anterior descending coronary artery a sequential saphenous vein graft to the first and third obtuse marginal coronary arteries and a separate saphenous vein graft to the posterior descending coronary artery.  Patient also with elevated troponin in the 200's during last admission which eventually resolved to 50's.     Problem List / ED Course / Critical interventions / Medication management  Patient presented for chest pain x2 hours. No SOB. Chest pain occurs intermittently when patient is arguing with nursing staff at outpatient rehab facility. Chest pain resolving some after ASA and NTG given by EMS. Physical exam with erythema stripe along left great saphenous vein which ends at a small incision site just distal to left knee. The incision sites on left lower leg and on sternum appears to be healing appropriately. Rest of physical exam reassuring. Patient afebrile with stable vitals. I Ordered, and personally interpreted labs.  BMP with BUN/Cr elevated, but actually improved from last value 2 weeks ago. CBC with anemia - hgb 8.7. no leukocytosis. Initial troponin elevated at 52 and then decreasing to 50. Patient's trop also elevated upon discharge in the 50's two weeks ago.  The patient was maintained on a cardiac monitor.  I personally viewed and interpreted the EKG/cardiac monitored which showed an underlying rhythm of: afib with no changes from prior EKGs. I ordered imaging studies including chest xray to assess for process contributing to patient's symptoms. I independently  visualized and interpreted imaging which showed no acute change - mild left atelectasis. I agree with the radiologist interpretation. I also ordered DVT US  given the erythema on patient's leg - no DVT appreciated on US .  Given discharge summary from 04/2023 CABG, patient's great saphenous vein was removed for graft. I am now less concerned for phlebitis, the erythema stripe on his leg appears to be post-surgical changes/healing.  Patient eventually sharing with Dr. Bernard that he has been having constant chest pain during the past 2 weeks as he has been healing from his CABG. Dr. Bernard providing patient with morphine  to help with this post-op/healing pain. Patient then eventually sharing with me that he was not too concerned about his chest pain today, he was just frustrated with constantly arguing with the nursing staff at the rehab facility and needed to get out of there. One of patient's greatest concerns is the now constant pitting edema of his BL legs which is causing him discomfort.. Chest xray is not concerning for fluid overload, but I did offer patient one dose of IV lasix  to help try and ease his BL pitting edema. Patient agreed to this dose. I educated patient to keep legs elevated and apply compressive stockings to help future pitting edema.  Shared lab/imaging results with patient. Answered all questions. Recommended following up with PCP/cardiologist outpatient. Patient verbalized understanding of plan and stated that he is going to try to get transferred to a new rehab facility.  Staffed with Dr. Bernard who agrees with workup and plan. I have reviewed the patients home medicines and have made adjustments as needed Patient was given return precautions. Patient stable for discharge at this time.  Patient verbalized understanding of plan.  Ddx:  These are considered less likely due to history of present illness and physical exam findings.  -Acute coronary syndrome: troponin not trending upwards,  EKG reassuring -Pneumonia: lungs are clear to auscultation bilaterally -Pulmonary embolism: vitals stable; No SOB -Pneumothorax: lungs are clear to auscultation bilaterally -Aortic dissection: vitals and history more concerning for other process at this time. -Cardiac tamponade: absence of hypotension, JVD, and muffled heart sounds   Social Determinants of Health:  Rehab facility resident         Final Clinical Impression(s) / ED Diagnoses Final diagnoses:  Leg edema  Chest pain, unspecified type    Rx / DC Orders ED Discharge Orders     None         Hoy Nidia FALCON, [place] 05/13/23 1712    Bernard Drivers, MD 05/14/23 [phone]

## 2023-05-13 NOTE — ED Notes (Signed)
 Pt states he has contacted his family and they are aware of he is here. Pt states he doesn't want to go back to the facility and prefers to go home.

## 2023-05-13 NOTE — Progress Notes (Signed)
 Lower extremity venous duplex completed. Please see CV Procedures for preliminary results.  Estanislao Heimlich, RVT 05/13/23 1:08 PM

## 2023-05-13 NOTE — ED Notes (Signed)
 Pt agreed to get scans completed. Radiology notified that pt is ready to transport.

## 2023-05-13 NOTE — ED Notes (Addendum)
 Refused transport back to facility. Prefers to return home instead. Pt to find own way home. Taken to lobby via W/C. Charge RN aware.

## 2023-05-13 NOTE — ED Triage Notes (Signed)
 Pt bib ems from [facility] c/o cp and swelling that started after his bypass two weeks ago.   PTA 324 mg aspirin  and 1 nitroglycerin    Pt left leg hot to touch, swelling, and redness.   Hx Afib DM2 MRSA CHF  SBP 150 CBG 150 T 98.4 RA 95%  20g Lft AC

## 2023-05-13 NOTE — ED Notes (Signed)
Pt has returned to room.

## 2023-05-13 NOTE — ED Notes (Signed)
 Ptar called

## 2023-05-13 NOTE — Discharge Instructions (Addendum)
 It was a pleasure caring for you today. Please follow up with your primary care provider. Seek emergency care if experiencing any new or worsening symptoms.   Alternating between 650 mg Tylenol and 400 mg Advil: The best way to alternate taking Acetaminophen (example Tylenol) and Ibuprofen (example Advil/Motrin) is to take them 3 hours apart. For example, if you take ibuprofen at 6 am you can then take Tylenol at 9 am. You can continue this regimen throughout the day, making sure you do not exceed the recommended maximum dose for each drug.

## 2023-05-15 ENCOUNTER — Other Ambulatory Visit: Payer: Self-pay | Admitting: *Deleted

## 2023-05-15 ENCOUNTER — Other Ambulatory Visit (HOSPITAL_COMMUNITY): Payer: Self-pay

## 2023-05-15 ENCOUNTER — Other Ambulatory Visit (HOSPITAL_BASED_OUTPATIENT_CLINIC_OR_DEPARTMENT_OTHER): Payer: Self-pay

## 2023-05-15 ENCOUNTER — Other Ambulatory Visit: Payer: Self-pay

## 2023-05-15 ENCOUNTER — Other Ambulatory Visit: Payer: Self-pay | Admitting: Physician Assistant

## 2023-05-15 DIAGNOSIS — Z7901 Long term (current) use of anticoagulants: Secondary | ICD-10-CM

## 2023-05-15 DIAGNOSIS — I480 Paroxysmal atrial fibrillation: Secondary | ICD-10-CM

## 2023-05-15 MED ORDER — APIXABAN 5 MG PO TABS: 5.0000 mg | ORAL_TABLET | Freq: Two times a day (BID) | ORAL | 1 refills | Status: AC

## 2023-05-15 MED ORDER — POTASSIUM CHLORIDE CRYS ER 20 MEQ PO TBCR
20.0000 meq | EXTENDED_RELEASE_TABLET | Freq: Every day | ORAL | 0 refills | Status: DC
  Filled 2023-05-15: qty 10, 10d supply, fill #0

## 2023-05-15 MED ORDER — FUROSEMIDE 40 MG PO TABS
40.0000 mg | ORAL_TABLET | Freq: Every day | ORAL | 0 refills | Status: DC
  Filled 2023-05-15: qty 10, 10d supply, fill #0

## 2023-05-15 NOTE — Progress Notes (Signed)
      [address]       [name] [address]             [phone]      The patient called the office concerned that his legs were still swollen. He went to the ED on 2/08 and was given a dose of IV Lasix  40mg  and diuresed well but still had pitting edema and his weight remained +6kg from preop weight. He has completed a 10 day course of PO Lasix  40mg . He denies shortness of breath. Creatinine on 02/08 was 1.98 which is his baseline. I will have the patient continue PO Lasix  40mg  daily and Potassium 20mg  daily until he sees Dr. Sherene Dilling in the office on 02/19.   Randa Burton, PA-C 05/15/23

## 2023-05-15 NOTE — Telephone Encounter (Signed)
 Prescription refill request for Eliquis  received. Indication:afib Last office visit:3/24 Scr:1.98  2/25 Age: 63 Weight:85  kg  Prescription refilled

## 2023-05-15 NOTE — Progress Notes (Signed)
 Patient contacted the office inquiring about lasix  RX. Per patient he became very edematous in his bilateral extremities at his SNF that he could not walk and had to go to the ED. Patient states his legs are still swollen. States he is elevating at rest. States he is taking lasix  1/2 tablet daily. Denies weight gain. Most recent weight is 178lb. Discussed with B. Stehler, PA. Additional 10 days of Lasix  40 mg and Potassium 20 meq sent to patient's preferred pharmacy. Patient has a follow up appt scheduled next week with Dr. Sherene Dilling.

## 2023-05-16 ENCOUNTER — Other Ambulatory Visit (HOSPITAL_COMMUNITY): Payer: Self-pay

## 2023-05-19 ENCOUNTER — Ambulatory Visit: Payer: Medicaid Other | Admitting: Physical Therapy

## 2023-05-23 ENCOUNTER — Other Ambulatory Visit: Payer: Self-pay | Admitting: Surgery

## 2023-05-23 DIAGNOSIS — Z951 Presence of aortocoronary bypass graft: Secondary | ICD-10-CM

## 2023-05-24 ENCOUNTER — Ambulatory Visit
Admission: RE | Admit: 2023-05-24 | Discharge: 2023-05-24 | Disposition: A | Discharge status: Home / Self Care | Payer: Medicaid Other | Source: Ambulatory Visit | Attending: Surgery | Admitting: Surgery

## 2023-05-24 ENCOUNTER — Ambulatory Visit (INDEPENDENT_AMBULATORY_CARE_PROVIDER_SITE_OTHER): Payer: Self-pay | Admitting: Surgical

## 2023-05-24 ENCOUNTER — Ambulatory Visit: Payer: Medicaid Other | Admitting: Surgery

## 2023-05-24 ENCOUNTER — Other Ambulatory Visit (HOSPITAL_COMMUNITY): Payer: Self-pay

## 2023-05-24 VITALS — BP 159/84 | HR 48 | Resp 18 | Ht 72.0 in | Wt 182.0 lb

## 2023-05-24 DIAGNOSIS — Z951 Presence of aortocoronary bypass graft: Secondary | ICD-10-CM

## 2023-05-24 NOTE — Patient Instructions (Signed)
Continue physical therapy modalities at skilled nursing facility. Legs elevated and use compression stockings.

## 2023-05-24 NOTE — Progress Notes (Signed)
[address]       [place] [address]             [phone]      OLIVIER FRAYRE [facility] Medical Record #[id number] Date of Birth: 12/01/1960  Referring: Wendall Stade, MD Primary Care: [name], MD Primary Cardiologist: Charlton Haws, MD   Chief Complaint:   POST OP FOLLOW UP                                                                                             CARDIOVASCULAR SURGERY OPERATIVE NOTE   04/18/2023   Surgeon:  Alleen Borne, MD   First Assistant: Jillyn Hidden, PA-C:       Preoperative Diagnosis:  Severe multi-vessel coronary artery disease     Postoperative Diagnosis:  Same     Procedure:   Median Sternotomy Extracorporeal circulation 3.   Coronary artery bypass grafting x 4   Left internal mammary artery graft to the LAD SVG to PDA Sequential SVG to OM1 and OM3   4.   Endoscopic vein harvest from the left leg 5.   Pulmonary vein isolation ablation using Encompass clamp 6.   Clipping of left atrial appendage   Anesthesia:  General Endotracheal   History of Present Illness:    Patient is a 62-year male status post the above described procedure seen in the office on today's date and routine postsurgical follow-up.  Since discharge he has been staying at a facility for ongoing rehabilitation as he had no one to stay with him at home and has significant limitations related to previous back surgery.  He does feel as though he is making good progress.  He thinks he will likely be able to get out of area next week to 10 days.  Most recently he has had a bit of a cough and congestion but attributes that to likely viral type illness.  These illnesses are ubiquitous is in the community currently.  He has not had any fevers, chills or difficulties with his incisions healing. He does have ongoing lower extremity edema.  Currently on Lasix 40 twice daily.  He will need to continue this for now.  Does take a potassium  supplement.  Also obtain some stockings and is being aggressive in terms of both physical therapy as well as keeping legs elevated when not ambulating.  He is not having any chest pain or anginal equivalents.  He is not having any palpitations.  Overall, although the progress has been generally slow he is pleased with it.   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   Bradycardia 04/14/2023   NSTEMI (non-ST elevated myocardial infarction) (HCC) 04/13/2023   Chronic bilateral low back pain 04/13/2023   Coronary artery disease involving coronary bypass graft of native heart with unstable angina pectoris (HCC) 04/12/2023   Acute decompensated heart failure (HCC) 04/10/2023   Chronic pain syndrome 04/09/2023   MRSA infection greater than 3 months ago 04/09/2023   Hypoglycemia 10/06/2022   Pleural effusion 09/29/2022  Chronic systolic CHF (congestive heart failure) (HCC) 09/29/2022   Acute urinary retention 09/29/2022   Anxiety 09/29/2022   Hardware complicating wound infection (HCC) 09/25/2022   Discitis of thoracic region 09/25/2022   Hypokalemia 09/07/2022   Acute toxic encephalopathy 09/07/2022   Acute postoperative anemia due to expected blood loss 09/07/2022   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   Adjustment disorder with mixed anxiety and depressed mood 08/13/2022   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 08/08/2022   MRSA bacteremia 08/08/2022   AKI (acute kidney injury), ruled out 07/27/2022   Discitis thoracic region 07/27/2022   Bacteremia due to methicillin resistant Staphylococcus aureus 07/27/2022   Severe protein-calorie malnutrition (HCC) 07/26/2022   PVD (peripheral vascular disease) (HCC) 07/26/2022   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   Bacteremia 07/15/2022   Atelectasis 07/12/2022   Sepsis due to pneumonia (HCC) 07/12/2022   Acute on chronic diastolic (congestive) heart failure (HCC) 07/12/2022   Gangrene of toe of right foot  (HCC)    Sepsis (HCC) 08/29/2021   SIRS (systemic inflammatory response syndrome) (HCC) 08/29/2021   Hyponatremia 08/29/2021   Hypomagnesemia 08/29/2021   Pure hypercholesterolemia 06/17/2021   Pain of left hand 09/06/2019   Cellulitis 08/26/2019   Persistent atrial fibrillation (HCC) 08/26/2019   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) 10/18/2018   Transaminitis 10/17/2018   Hyperbilirubinemia 10/17/2018   Leukocytosis 10/17/2018   Sinusitis 10/17/2018   Chronic anticoagulation 09/30/2017   Smoker 09/30/2017   Cardiomyopathy (HCC) 09/30/2017   Sleep apnea suspected 09/30/2017   Primary hypertension 09/26/2017   Type 2 diabetes mellitus (HCC) 09/26/2017   Atrial fibrillation (HCC) 09/26/2017      Past Medical History:  Diagnosis Date   Cardiomyopathy (HCC)    a. EF 45% in 2019.   CHF (congestive heart failure) (HCC)    Chronic anticoagulation 09/30/2017   Diabetes mellitus type 2 in nonobese [facility])    Discitis of thoracic region 09/25/2022   Does not have health insurance    Essential hypertension 09/26/2017   Financial difficulties    H/O noncompliance with medical treatment, presenting hazards to health    Hardware complicating wound infection (HCC) 09/25/2022   Non-insulin treated type 2 diabetes mellitus (HCC) 09/26/2017   Persistent atrial fibrillation (HCC)    Sleep apnea suspected 09/30/2017     Social History   Tobacco Use  Smoking Status Former   Current packs/day: 0.00   Types: Cigarettes   Start date: 03/19/2003   Quit date: 03/18/2018   Years since quitting: 5.1  Smokeless Tobacco Never  Tobacco Comments   09/26/2017 "2-3 cigarettes/month now"    Social History   Substance and Sexual Activity  Alcohol Use Not Currently   Alcohol/week: 3.0 standard drinks of alcohol   Types: 3 Cans of beer per week   Comment: States he quit drinking 8-9 months ago     No Known Allergies  Current Outpatient Medications  Medication Sig  Dispense Refill   Accu-Chek Softclix Lancets lancets Use to check blood sugar 3 times daily. 100 each 6   ALPRAZolam (XANAX) 0.5 MG tablet Take 1 tablet (0.5 mg total) by mouth at bedtime as needed for anxiety or sleep. 30 tablet 0   apixaban (ELIQUIS) 5 MG TABS tablet Take 1 tablet (5 mg total) by mouth 2 (two) times daily. 180 tablet 1   aspirin EC 81 MG tablet Take 1 tablet (81 mg total) by mouth daily. Swallow whole.     Blood Glucose Monitoring Suppl (ACCU-CHEK GUIDE) w/Device KIT  Use to check blood sugar 3 times daily. E11.9 1 kit 0   carvedilol (COREG) 3.125 MG tablet Take 1 tablet (3.125 mg total) by mouth 2 (two) times daily. 180 tablet 1   Continuous Glucose Receiver (DEXCOM G7 RECEIVER) DEVI Use as directed. 1 each 0   Continuous Glucose Sensor (DEXCOM G7 SENSOR) MISC Use as directed & change every 10 days. 3 each 12   dapagliflozin propanediol (FARXIGA) 10 MG TABS tablet Take 1 tablet (10 mg total) by mouth daily before breakfast. 90 tablet 1   doxycycline (VIBRA-TABS) 100 MG tablet Take 1 tablet (100 mg total) by mouth every 12 (twelve) hours. 180 tablet 3   ezetimibe (ZETIA) 10 MG tablet Take 1 tablet (10 mg total) by mouth daily.     famotidine (PEPCID) 20 MG tablet Take 1 tablet (20 mg total) by mouth 2 times daily. 60 tablet 1   fluticasone (FLONASE) 50 MCG/ACT nasal spray Place 2 sprays into both nostrils daily. 16 g 2   furosemide (LASIX) 40 MG tablet Take 1 tablet (40 mg total) by mouth daily for 10 days. For 10 days. 10 tablet 0   glucose blood (ACCU-CHEK GUIDE) test strip Use to check blood sugar 3 times daily. 100 each 6   HYDROcodone-acetaminophen (NORCO) 10-325 MG tablet Take 1 tablet by mouth every 4 (four) hours as needed for severe pain (pain score 7-10). 42 tablet 0   insulin glargine (LANTUS SOLOSTAR) 100 UNIT/ML Solostar Pen Inject 27 Units into the skin daily. 30 mL 6   Insulin Pen Needle (PEN NEEDLES) 32G X 4 MM MISC Use to inject insulin once daily. 100 each 6    methocarbamol (ROBAXIN) 750 MG tablet Take 1 tablet (750 mg total) by mouth every 8 (eight) hours as needed for muscle spasms.     Misc. 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 14 tablet 0   polyethylene glycol (MIRALAX / GLYCOLAX) 17 g packet Take 17 g by mouth 2 (two) times daily.     potassium chloride SA (KLOR-CON M) 20 MEQ tablet Take 1 tablet (20 mEq total) by mouth daily for 10 days. 10 tablet 0   pregabalin (LYRICA) 75 MG capsule Take 75 mg by mouth 2 (two) times daily.     rosuvastatin (CRESTOR) 20 MG tablet Take 1 tablet (20 mg total) by mouth daily. 90 tablet 1   senna (SENOKOT) 8.6 MG TABS tablet Take 2 tablets (17.2 mg total) by mouth 2 (two) times daily.     tamsulosin (FLOMAX) 0.4 MG CAPS capsule Take 1 capsule (0.4 mg total) by mouth daily after supper. 90 capsule 1   terbinafine (LAMISIL) 250 MG tablet Take 1 tablet (250 mg total) by mouth daily. 90 tablet 1   XTAMPZA ER 9 MG C12A Take 1 capsule by mouth every 12 (twelve) hours.     No current facility-administered medications for this visit.       Physical Exam: BP (!) 159/84 (BP Location: Right Arm)   Ht 6' (1.829 m)   Wt 182 lb (82.6 kg)  BMI 24.68 kg/m   General appearance: alert, cooperative, and no distress Heart: regular rate and rhythm Pulmonary: Clear breath sounds Extremities: Positive bilateral lower extremity edema Incisions: Healing well without evidence of infection.  Diagnostic Studies & Laboratory data:     Recent Radiology Findings:   DG Chest 2 View Result Date: 05/24/2023 CLINICAL DATA:  cabg.  Weakness.  EXAM: CHEST - 2 VIEW COMPARISON:  05/13/2023. FINDINGS: Low lung volume. Mild central vascular congestion is likely secondary to low lung volume. No pulmonary edema. Bilateral lung fields are clear. Bilateral costophrenic angles are clear. Stable cardio-mediastinal silhouette. Sternotomy wires noted, status post CABG. Left atrial appendage closure device noted. No acute osseous abnormalities. Thoracic spinal fixation hardware is unchanged. The soft tissues are within normal limits. IMPRESSION: No active cardiopulmonary disease. Electronically Signed   By: Jules Schick M.D.   On: 05/24/2023 12:09      Recent Lab Findings: Lab Results  Component Value Date   WBC 7.3 05/13/2023   HGB 8.7 (L) 05/13/2023   HCT 27.0 (L) 05/13/2023   PLT 203 05/13/2023   GLUCOSE 130 (H) 05/13/2023   CHOL 199 01/09/2023   TRIG 235 (H) 01/09/2023   HDL 36 (L) 01/09/2023   LDLCALC 121 (H) 01/09/2023   ALT 15 04/09/2023   AST 16 04/09/2023   NA 133 (L) 05/13/2023   K 3.6 05/13/2023   CL 100 05/13/2023   CREATININE 1.98 (H) 05/13/2023   BUN 31 (H) 05/13/2023   CO2 21 (L) 05/13/2023   TSH 0.855 09/04/2022   INR 1.5 (H) 04/18/2023   HGBA1C 7.4 (H) 04/09/2023      Assessment / Plan: Steady ongoing progress.  He continues to reside at a skilled nursing facility.  He should continue his Lasix/potassium twice daily for now.  He missed his first cardiology follow-up likely because he is still in the facility.  Echocardiogram (preop)from 04/11/2023 showed EF of 65 to 70% with normal LV function.  Right ventricular systolic function was mildly reduced.  Did not make any changes to his other medication regimen.  He is clearly making slow but steady progress in terms of his physical recovery.  We will see the patient again on a as needed basis for any surgically related needs or at request.      Medication Changes: No orders of the defined types were placed in this encounter.     Rowe Clack, PA-C  05/24/2023  12:20 PM

## 2023-05-26 ENCOUNTER — Other Ambulatory Visit: Payer: Self-pay

## 2023-05-26 ENCOUNTER — Telehealth: Payer: Self-pay

## 2023-05-26 MED ORDER — FUROSEMIDE 40 MG PO TABS: 40.0000 mg | ORAL_TABLET | Freq: Two times a day (BID) | ORAL | 0 refills | Status: DC

## 2023-05-26 MED ORDER — POTASSIUM CHLORIDE CRYS ER 20 MEQ PO TBCR: 40.0000 meq | EXTENDED_RELEASE_TABLET | Freq: Every day | ORAL | 0 refills | Status: DC

## 2023-05-26 NOTE — Telephone Encounter (Signed)
Ivor Reining, RN with [facility] contacted the office requesting medication clarification as the rehab facility has patient is to take Lasix 40 mg daily and Potassium 40 meq daily. Patient was seen in office and found to be very edematous. Seen by Gershon Crane, PA whom states patient should continue Lasix 40mg  BID and Potassium 40 meq daily. Clarified with [place], [place] again and made [place], RN aware of changes and faxed recent office visit note to rehab facility. Advised per [place], [place] patient should continue medication until follow-up with Cardiology 06/16/23.

## 2023-05-31 ENCOUNTER — Ambulatory Visit: Payer: Medicaid Other | Admitting: Physician Assistant

## 2023-06-01 ENCOUNTER — Ambulatory Visit: Payer: Medicaid Other | Admitting: Family Medicine

## 2023-06-08 NOTE — Telephone Encounter (Addendum)
 Call to patient to schedule appointment for ok with [facility]. Patient has not been seen since November and must be seen before [facility] can be approved. Unable to reach message left on VM .

## 2023-06-08 NOTE — Telephone Encounter (Unsigned)
 Copied from CRM [id number]. Topic: Clinical - Home Health Verbal Orders >> Jun 08, 2023 10:21 AM Gildardo Pounds wrote: Caller/Agency: Arlys John with [facility] Callback Number: [phone] confidential VM Service Requested: Physical Therapy Frequency: 1 a week for 8 weeks Any new concerns about the patient? No

## 2023-06-09 ENCOUNTER — Telehealth: Payer: Self-pay | Admitting: *Deleted

## 2023-06-09 ENCOUNTER — Other Ambulatory Visit (HOSPITAL_COMMUNITY): Payer: Self-pay

## 2023-06-09 NOTE — Telephone Encounter (Signed)
 Yes he will need a visit it can be a virtual visit if he prefers

## 2023-06-09 NOTE — Telephone Encounter (Signed)
 Copied from CRM [id number]. Topic: General - Other >> Jun 08, 2023  4:21 PM Emylou G wrote: Reason for CRM: Please contact patient .Marland Kitchen He doesn't understand why he needs to be seen .Marland Kitchen He thought the therapy is already approved.  [phone]

## 2023-06-12 NOTE — Telephone Encounter (Signed)
 Due to no documentation, patient was called back to schedule an appointment. He stated, "Ma'am, I have already taken care all of this".   He scheduled an appointment on 06/08/2023 at 5p with [facility].

## 2023-06-14 ENCOUNTER — Telehealth: Payer: Self-pay | Admitting: Family Medicine

## 2023-06-14 NOTE — Telephone Encounter (Signed)
 A document form has been faxed: Home Health order, to be filled out by provider. Send document back via Fax within 5-days. Document is located in providers tray at front office.           Fax number:  [phone]

## 2023-06-15 NOTE — Telephone Encounter (Signed)
 Noted.

## 2023-06-16 ENCOUNTER — Encounter: Payer: Self-pay | Admitting: Physician Assistant

## 2023-06-16 ENCOUNTER — Ambulatory Visit: Payer: Medicaid Other | Attending: Physician Assistant | Admitting: Physician Assistant

## 2023-06-16 ENCOUNTER — Other Ambulatory Visit (HOSPITAL_COMMUNITY): Payer: Self-pay

## 2023-06-16 VITALS — BP 138/90 | HR 123 | Ht 72.0 in | Wt 182.6 lb

## 2023-06-16 DIAGNOSIS — I10 Essential (primary) hypertension: Secondary | ICD-10-CM

## 2023-06-16 DIAGNOSIS — I4819 Other persistent atrial fibrillation: Secondary | ICD-10-CM

## 2023-06-16 DIAGNOSIS — I5020 Unspecified systolic (congestive) heart failure: Secondary | ICD-10-CM

## 2023-06-16 DIAGNOSIS — Z79899 Other long term (current) drug therapy: Secondary | ICD-10-CM

## 2023-06-16 DIAGNOSIS — I5022 Chronic systolic (congestive) heart failure: Secondary | ICD-10-CM

## 2023-06-16 DIAGNOSIS — E7800 Pure hypercholesterolemia, unspecified: Secondary | ICD-10-CM

## 2023-06-16 DIAGNOSIS — Z951 Presence of aortocoronary bypass graft: Secondary | ICD-10-CM

## 2023-06-16 MED ORDER — CARVEDILOL 6.25 MG PO TABS
6.2500 mg | ORAL_TABLET | Freq: Two times a day (BID) | ORAL | 3 refills | Status: DC
  Filled 2023-06-16 – 2023-07-02 (×2): qty 180, 90d supply, fill #0
  Filled 2023-11-07: qty 180, 90d supply, fill #1
  Filled 2024-02-07: qty 180, 90d supply, fill #0
  Filled 2024-03-06 – 2024-05-05 (×2): qty 180, 90d supply, fill #1

## 2023-06-16 NOTE — Progress Notes (Signed)
 Office Visit    Patient Name: David [name] Date of Encounter: 06/16/2023  PCP:  [name], MD   [facility]  Cardiologist:  Charlton Haws, MD  Advanced Practice Provider:  No care team member to display Electrophysiologist:  None   HPI    David [name] is a 62 y.o. male with a hx of diabetes mellitus, persistent atrial fibrillation (failed DCCV 09/2017 and attempted sotalol with plan to repeat DCCV; DCCV not done and patient stopped meds), HFrEF (Myoview in 2019 with mild anterior lateral ischemia), suspected OSA, hypertension presents today for follow-up appointment.    He was admitted 10/2018 with AF with RVR and low EF.  The plan at that time was to attempt rate control and repeat echocardiogram 3 months later to check for improvement.  If his EF did not improve, he would need ischemic eval.  He was lost to follow-up at this time.  He was seen in the clinic in 3/22 and was supposed to return in 3 months.  He returned to the office 3/23 for follow-up visit with Tereso Newcomer, PA-C.  He was complaining of fatigue and lethargy for about a year.  He had some shortness of breath when he climbs stairs but no chest pain, syncope, orthopnea, leg edema.  He does not smoke.  He was seen by me 7/23,, he recently had his toe amputated by Dr. [name] due to diabetes.  He states that his toe was infected and it spread his blood.  He had MRSA in his blood and is now on IV antibiotics for 6 weeks through a PICC line.  He states that he has not been very active over the past few weeks because of his toe.  He is a Investment banker, operational for work and hopes that Dr. [name] will clear him on Friday to go back to work.  Due to all this going on, this was why he was never scheduled for his [employer].  I asked the patient to asked Dr. [name] when it would be appropriate to move forward with this test given his current condition.  Before his toe amputation, he admits to not taking his insulin like he should  and not eating like he should.  Recent A1c was over 11.  He has however lost 60 pounds over the last year and has tried to make healthier choices lately.  Once he recovers from his toe surgery he plans to be more active.  He had a new onset diagnosis of atrial fibrillation with a failed cardioversion.  He was supposed to see EP and the appointment was never scheduled.  We will plan for him to follow-up with EP today.  He may be an ablation candidate.  He was also recently started on a statin due to his cholesterol levels when he was in the hospital.  We have arranged for him to get follow-up labs in 8 weeks to check a lipid panel.  He does have some shortness of breath with exertion but this could also be due to deconditioning.  Echocardiogram from his hospitalization reviewed with the patient.  He saw me 07/01/22, he tells me that he had his toe amputation and then ended up with more infection on the bottom of his foot.  They told him he did not have enough blood flow to heal it up.  They went into his leg and opened some arteries and it was still not healing.  They told him that he was  in our practice with amputated.  He went to get a second opinion at the wound center at [facility].  They debrided that but did not cast every week.  It did not have stopped healing and ultimately he got an amputation of his pinky toe on that side.  Now he is finally able to walk and able to go back to work.  Heart: He wants to get this atrial fibrillation under control.  He is in A-fib with RVR rate 106 bpm.  He needs another cardioversion. We have asked him to not miss a dose of Eliquis.  He is a Investment banker, operational and gets a lot of exercise at work.  He has to do some heavy lifting and is always on the go.  No drinking or smoking.    Reports no shortness of breath nor dyspnea on exertion. Reports no chest pain, pressure, or tightness. No edema, orthopnea, PND.    He presents with a significant past medical history of atrial  fibrillation, diabetes, and recent major surgeries including spinal surgery and coronary artery bypass graft (CABG x 4),  for follow-up. The patient reports having undergone spinal surgery due to a bursary infection leading to sepsis. Following recovery from the spinal surgery, the patient underwent CABG. The patient reports significant improvement post-surgeries, with increased mobility and overall well-being. However, the patient continues to experience some balance issues and pain related to the spinal surgery.  The patient also reports a history of AFib, which appears to be well-managed post-CABG.  In addition to the cardiac and spinal issues, the patient reports a nasal polyp that has been causing breathing difficulties and taste alterations for about a year. We will look for clearance request.   The patient also mentions significant stress related to financial difficulties due to being out of work since the surgeries. The patient's disability has been approved but the check has not yet been received, causing additional stress.  Reports no shortness of breath nor dyspnea on exertion. Reports no chest pain, pressure, or tightness. No orthopnea, PND. Reports no palpitations.   Discussed the use of AI scribe software for clinical note transcription with the patient, who gave verbal consent to proceed.  Past Medical History    Past Medical History:  Diagnosis Date   Cardiomyopathy (HCC)    a. EF 45% in 2019.   CHF (congestive heart failure) (HCC)    Chronic anticoagulation 09/30/2017   Diabetes mellitus type 2 in nonobese [facility])    Discitis of thoracic region 09/25/2022   Does not have health insurance    Essential hypertension 09/26/2017   Financial difficulties    H/O noncompliance with medical treatment, presenting hazards to health    Hardware complicating wound infection (HCC) 09/25/2022   Non-insulin treated type 2 diabetes mellitus (HCC) 09/26/2017   Persistent atrial fibrillation  (HCC)    Sleep apnea suspected 09/30/2017   Past Surgical History:  Procedure Laterality Date   ABDOMINAL AORTOGRAM W/LOWER EXTREMITY N/A 09/02/2021   Procedure: ABDOMINAL AORTOGRAM W/LOWER EXTREMITY;  Surgeon: Nada Libman, MD;  [facility]: [facility];  Service: Cardiovascular;  Laterality: N/A;   ABDOMINAL AORTOGRAM W/LOWER EXTREMITY N/A 12/28/2021   Procedure: ABDOMINAL AORTOGRAM W/LOWER EXTREMITY;  Surgeon: Nada Libman, MD;  Location: [facility];  Service: Cardiovascular;  Laterality: N/A;   ABDOMINAL AORTOGRAM W/LOWER EXTREMITY N/A 07/28/2022   Procedure: ABDOMINAL AORTOGRAM W/LOWER EXTREMITY;  Surgeon: Cephus Shelling, MD;  Location: [facility];  Service: Cardiovascular;  Laterality:  N/A;   AMPUTATION Right 09/04/2021   Procedure: 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;  Surgeon: Nadara Mustard, MD;  Location: [facility] OR;  Service: Orthopedics;  Laterality: Right;   AMPUTATION Right 08/02/2022   Procedure: AMPUTATION RAY 5TH METATARSAL OF RIGHT FOOT;  Surgeon: [name], MD;  Location: [facility] OR;  Service: Orthopedics;  Laterality: Right;   APPENDECTOMY  1971   BONE BIOPSY Right 08/02/2022   Procedure: BONE BIOPSY OF 5TH METATARSAL RIGHT FOOT;  Surgeon: [name], MD;  Location: [facility] OR;  Service: Orthopedics;  Laterality: Right;   CARDIOVERSION N/A 09/28/2017   Procedure: CARDIOVERSION;  Surgeon: Thurmon Fair, MD;  Location: [facility];  Service: Cardiovascular;  Laterality: N/A;   CLIPPING OF ATRIAL APPENDAGE N/A 04/18/2023   Procedure: CLIPPING OF ATRIAL APPENDAGE USING ATRICURE ATRICLIP [id number] with pulmonary vein isolation ablation;  Surgeon: Alleen Borne, MD;  Location: [facility] OR;  Service: Open Heart Surgery;  Laterality: N/A;   CORONARY ARTERY BYPASS GRAFT N/A 04/18/2023   Procedure: CORONARY ARTERY BYPASS GRAFTING (CABG) TIMES FOUR USING LEFT INTERNAL MAMMARY ARTERY AND ENDOSCOPICALLY HARVESTED LEFT GREATER SAPHENOUS VEIN;  Surgeon: Alleen Borne,  MD;  Location: [facility] OR;  Service: Open Heart Surgery;  Laterality: N/A;   IRRIGATION AND DEBRIDEMENT FOOT  08/02/2022   Procedure: IRRIGATION AND DEBRIDEMENT RIGHT 5TH METATARSAL FOOT;  Surgeon: [name], MD;  Location: [facility];  Service: Orthopedics;;   LAMINECTOMY WITH POSTERIOR LATERAL ARTHRODESIS LEVEL 4 N/A 08/30/2022   Procedure: THORACIC FIVE-THORACIC ELEVEN FUSION, THORACIC EIGHT TRANSPEDICULAR DECOMPRESSION AND PARTIAL CORPECTOMY with O-Arm;  Surgeon: Bedelia Person, MD;  Location: [facility] OR;  Service: Neurosurgery;  Laterality: N/A;   LEFT HEART CATH AND CORONARY ANGIOGRAPHY N/A 04/12/2023   Procedure: LEFT HEART CATH AND CORONARY ANGIOGRAPHY;  Surgeon: Kathleene Hazel, MD;  [facility];  Service: Cardiovascular;  Laterality: N/A;   PERIPHERAL VASCULAR BALLOON ANGIOPLASTY  09/02/2021   Procedure: PERIPHERAL VASCULAR BALLOON ANGIOPLASTY;  Surgeon: Nada Libman, MD;  [facility];  Service: Cardiovascular;;   PERIPHERAL VASCULAR BALLOON ANGIOPLASTY  12/28/2021   Procedure: PERIPHERAL VASCULAR BALLOON ANGIOPLASTY;  Surgeon: Nada Libman, MD;  [facility];  Service: Cardiovascular;;  Posterior Tibial PTA only   TEE WITHOUT CARDIOVERSION N/A 09/28/2017   Procedure: TRANSESOPHAGEAL ECHOCARDIOGRAM (TEE);  Surgeon: Thurmon Fair, MD;  Location: [facility] ENDOSCOPY;  Service: Cardiovascular;  Laterality: N/A;   TEE WITHOUT CARDIOVERSION N/A 09/03/2021   Procedure: TRANSESOPHAGEAL ECHOCARDIOGRAM (TEE);  Surgeon: Chilton Si, MD;  Location: [facility] ENDOSCOPY;  Service: Cardiovascular;  Laterality: N/A;   TEE WITHOUT CARDIOVERSION N/A 07/15/2022   Procedure: TRANSESOPHAGEAL ECHOCARDIOGRAM;  Surgeon: Thurmon Fair, MD;  [facility];  Service: Cardiovascular;  Laterality: N/A;   TEE WITHOUT CARDIOVERSION N/A 04/18/2023   Procedure: TRANSESOPHAGEAL ECHOCARDIOGRAM (TEE);  Surgeon: Alleen Borne, MD;  Location: [facility] OR;   Service: Open Heart Surgery;  Laterality: N/A;    Allergies  No Known Allergies   EKGs/Labs/Other Studies Reviewed:   The following studies were reviewed today:  09/03/2021 echocardiogram  IMPRESSIONS     1. Left ventricular ejection fraction, by estimation, is 60 to 65%. The  left ventricle has normal function. The left ventricle has no regional  wall motion abnormalities.   2. Right ventricular systolic function is normal. The right ventricular  size is normal.   3. No left atrial/left atrial appendage thrombus was detected.   4. The mitral valve is normal in structure. Trivial mitral  valve  regurgitation. No evidence of mitral stenosis.   5. The aortic valve is tricuspid. Aortic valve regurgitation is not  visualized. No aortic stenosis is present.   6. The inferior vena cava is normal in size with greater than 50%  respiratory variability, suggesting right atrial pressure of 3 mmHg.   Conclusion(s)/Recommendation(s): Normal biventricular function without  evidence of hemodynamically significant valvular heart disease.   Echocardiogram 10/17/2018 EF 35-40, moderate LVH, moderately reduced RVSF, mild to moderate MR, RVSP 36.3   Myoview 09/29/2017 Mild mid anterolateral reversibility-equivocal finding, EF 44; intermediate risk Reviewed by Dr. Eden Emms - no infarct; ?mild ant-lat ischemia >> focus on Rx of AFib at that time  EKG:  EKG is ordered today.  Atrial fibrillation with RVR, rate 106 bpm  Recent Labs: 09/04/2022: TSH 0.855 04/09/2023: ALT 15; B Natriuretic Peptide 419.7 05/02/2023: Magnesium 1.7 05/13/2023: BUN 31; Creatinine, Ser 1.98; Hemoglobin 8.7; Platelets 203; Potassium 3.6; Sodium 133  Recent Lipid Panel    Component Value Date/Time   CHOL 199 01/09/2023 0841   TRIG 235 (H) 01/09/2023 0841   HDL 36 (L) 01/09/2023 0841   CHOLHDL 5.9 (H) 12/14/2021 1025   CHOLHDL 6.6 09/03/2021 0314   VLDL 50 (H) 09/03/2021 0314   LDLCALC 121 (H) 01/09/2023 0841    Home  Medications   Current Meds  Medication Sig   Accu-Chek Softclix Lancets lancets Use to check blood sugar 3 times daily.   ALPRAZolam (XANAX) 0.5 MG tablet Take 1 tablet (0.5 mg total) by mouth at bedtime as needed for anxiety or sleep.   apixaban (ELIQUIS) 5 MG TABS tablet Take 1 tablet (5 mg total) by mouth 2 (two) times daily.   aspirin EC 81 MG tablet Take 1 tablet (81 mg total) by mouth daily. Swallow whole.   Blood Glucose Monitoring Suppl (ACCU-CHEK GUIDE) w/Device KIT Use to check blood sugar 3 times daily. E11.9   carvedilol (COREG) 3.125 MG tablet Take 1 tablet (3.125 mg total) by mouth 2 (two) times daily.   Continuous Glucose Receiver (DEXCOM G7 RECEIVER) DEVI Use as directed.   Continuous Glucose Sensor (DEXCOM G7 SENSOR) MISC Use as directed & change every 10 days.   dapagliflozin propanediol (FARXIGA) 10 MG TABS tablet Take 1 tablet (10 mg total) by mouth daily before breakfast.   doxycycline (VIBRA-TABS) 100 MG tablet Take 1 tablet (100 mg total) by mouth every 12 (twelve) hours.   ezetimibe (ZETIA) 10 MG tablet Take 1 tablet (10 mg total) by mouth daily.   famotidine (PEPCID) 20 MG tablet Take 1 tablet (20 mg total) by mouth 2 times daily.   fluticasone (FLONASE) 50 MCG/ACT nasal spray Place 2 sprays into both nostrils daily.   furosemide (LASIX) 40 MG tablet Take 1 tablet (40 mg total) by mouth 2 (two) times daily.   glucose blood (ACCU-CHEK GUIDE) test strip Use to check blood sugar 3 times daily.   HYDROcodone-acetaminophen (NORCO) 10-325 MG tablet Take 1 tablet by mouth every 4 (four) hours as needed for severe pain (pain score 7-10).   insulin glargine (LANTUS SOLOSTAR) 100 UNIT/ML Solostar Pen Inject 27 Units into the skin daily.   Insulin Pen Needle (PEN NEEDLES) 32G X 4 MM MISC Use to inject insulin once daily.   methocarbamol (ROBAXIN) 750 MG tablet Take 1 tablet (750 mg total) by mouth every 8 (eight) hours as needed for muscle spasms.   Misc. 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   polyethylene glycol (MIRALAX / GLYCOLAX) 17 g packet Take 17 g by mouth 2 (two) times daily.   potassium chloride SA (KLOR-CON M) 20 MEQ tablet Take 2 tablets (40 mEq total) by mouth daily.   pregabalin (LYRICA) 75 MG capsule Take 75 mg by mouth 2 (two) times daily.   rosuvastatin (CRESTOR) 20 MG tablet Take 1 tablet (20 mg total) by mouth daily.   tamsulosin (FLOMAX) 0.4 MG CAPS capsule Take 1 capsule (0.4 mg total) by mouth daily after supper.   XTAMPZA ER 9 MG C12A Take 1 capsule by mouth every 12 (twelve) hours.     Review of Systems      All other systems reviewed and are otherwise negative except as noted above.  Physical Exam    VS:  BP (!) 138/90   Pulse (!) 123   Ht 6' (1.829 m)   Wt 182 lb 9.6 oz (82.8 kg)   SpO2 97%   BMI 24.77 kg/m  , BMI Body mass index is 24.77 kg/m.  Wt Readings from Last 3 Encounters:  06/16/23 182 lb 9.6 oz (82.8 kg)  05/24/23 182 lb (82.6 kg)  05/13/23 187 lb 8 oz (85 kg)     GEN: Well nourished, well developed, in no acute distress. HEENT: normal. Neck: Supple, no JVD, carotid bruits, or masses. Cardiac: Irregularly irregular, no murmurs, rubs, or gallops. No clubbing, cyanosis, mild bilateral edema.  Radials/PT 2+ and equal bilaterally.  Respiratory:  Respirations regular and unlabored, clear to auscultation bilaterally. GI: Soft, nontender, nondistended. MS: No deformity or atrophy. Skin: Warm and dry, no rash. Neuro:  Strength and sensation are intact. Psych: Normal affect.  Assessment & Plan    Atrial Fibrillation (AFib) Chronic AFib with  previous unsuccessful cardioversion. Post-CABG, pulmonary vein ablation performed. Currently in sinus tachycardia. Left atrial appendage clipped to reduce stroke risk. - Increase carvedilol to 6.25 mg twice daily. - Coordinate with primary care provider regarding medication changes.  Coronary Artery Bypass Grafting (CABG) Recent CABG for severe coronary artery disease. Recovering with post-operative leg swelling and pain. - Advise warm compresses and leg elevation to reduce swelling. - Monitor for signs of infection or complications at surgical sites. -increase coreg for better HR and BP control  Diabetes Mellitus Diabetes well-controlled with insulin. Recent blood glucose levels under 150 mg/dL, U9W at 1.1%. - Continue current insulin regimen. - Recheck A1c with primary care provider.  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 Significant progress with physical therapy. - Continue physical therapy at home. - Encourage use of resistance bands and walking for rehabilitation.  Nasal Polyp Nasal polyp causing breathing difficulties and loss of taste. Scheduled for surgical removal. - Coordinate with preoperative department for nasal polyp removal. - Hold Eliquis for 1-2 days prior to surgery.  Follow-up Follow-up plans to ensure continuity of care. - Schedule follow-up appointment in a few months with cardiologist. - Prepare for lipid panel at next visit, ensuring fasting prior to test.  Disposition: Follow up 3 months with Charlton Haws, MD or APP.  Signed, Sharlene Dory, PA-C 06/16/2023, 3:23 PM [facility]

## 2023-06-16 NOTE — Patient Instructions (Signed)
 Medication Instructions:  Increase Carvedilol to 6.25 mg twice daily *If you need a refill on your cardiac medications before your next appointment, please call your pharmacy*   Lab Work: Fasting Lipid panel in 3 months after follow up appointment If you have labs (blood work) drawn today and your tests are completely normal, you will receive your results only by: MyChart Message (if you have MyChart) OR A paper copy in the mail If you have any lab test that is abnormal or we need to change your treatment, we will call you to review the results.   Follow-Up: At [facility], you and your health needs are our priority.  As part of our continuing mission to provide you with exceptional heart care, we have created designated Provider Care Teams.  These Care Teams include your primary Cardiologist (physician) and Advanced Practice Providers (APPs -  Physician Assistants and Nurse Practitioners) who all work together to provide you with the care you need, when you need it.  We recommend signing up for the patient portal called "MyChart".  Sign up information is provided on this After Visit Summary.  MyChart is used to connect with patients for Virtual Visits (Telemedicine).  Patients are able to view lab/test results, encounter notes, upcoming appointments, etc.  Non-urgent messages can be sent to your provider as well.   To learn more about what you can do with MyChart, go to [email or web address].    Your next appointment:   3 month(s)  Provider:   Eden Emms or APP  Other Instructions    1st Floor: - Lobby - Registration  - Pharmacy  - Lab - Cafe  2nd Floor: - PV Lab - Diagnostic Testing (echo, CT, nuclear med)  3rd Floor: - Vacant  4th Floor: - TCTS (cardiothoracic surgery) - AFib Clinic - Structural Heart Clinic - Vascular Surgery  - Vascular Ultrasound  5th Floor: - HeartCare Cardiology (general and EP) - Clinical Pharmacy for coumadin, hypertension, lipid,  weight-loss medications, and med management appointments    Valet parking services will be available as well.

## 2023-06-22 ENCOUNTER — Other Ambulatory Visit (HOSPITAL_COMMUNITY): Payer: Self-pay

## 2023-06-22 ENCOUNTER — Ambulatory Visit: Attending: Physician Assistant | Admitting: Physician Assistant

## 2023-06-22 ENCOUNTER — Encounter: Payer: Self-pay | Admitting: Physician Assistant

## 2023-06-22 ENCOUNTER — Other Ambulatory Visit: Payer: Self-pay

## 2023-06-22 VITALS — BP 140/94 | HR 118 | Wt 182.4 lb

## 2023-06-22 DIAGNOSIS — Z794 Long term (current) use of insulin: Secondary | ICD-10-CM | POA: Diagnosis not present

## 2023-06-22 DIAGNOSIS — I5020 Unspecified systolic (congestive) heart failure: Secondary | ICD-10-CM | POA: Diagnosis not present

## 2023-06-22 DIAGNOSIS — E1159 Type 2 diabetes mellitus with other circulatory complications: Secondary | ICD-10-CM

## 2023-06-22 DIAGNOSIS — E1169 Type 2 diabetes mellitus with other specified complication: Secondary | ICD-10-CM | POA: Diagnosis not present

## 2023-06-22 DIAGNOSIS — I152 Hypertension secondary to endocrine disorders: Secondary | ICD-10-CM

## 2023-06-22 DIAGNOSIS — K5909 Other constipation: Secondary | ICD-10-CM

## 2023-06-22 DIAGNOSIS — M4644 Discitis, unspecified, thoracic region: Secondary | ICD-10-CM

## 2023-06-22 LAB — GLUCOSE, POCT (MANUAL RESULT ENTRY): POC Glucose: 176 mg/dL — AB (ref 70–99)

## 2023-06-22 MED ORDER — FUROSEMIDE 40 MG PO TABS
40.0000 mg | ORAL_TABLET | Freq: Two times a day (BID) | ORAL | 0 refills | Status: DC
  Filled 2023-06-22 – 2023-07-11 (×2): qty 60, 30d supply, fill #0

## 2023-06-22 MED ORDER — PREGABALIN 75 MG PO CAPS
75.0000 mg | ORAL_CAPSULE | Freq: Two times a day (BID) | ORAL | 2 refills | Status: DC
Start: 2023-06-22 — End: 2023-07-21

## 2023-06-22 MED ORDER — POLYETHYLENE GLYCOL 3350 17 G PO PACK
17.0000 g | PACK | Freq: Two times a day (BID) | ORAL | 3 refills | Status: AC
  Filled 2023-06-22: qty 66, 33d supply, fill #0
  Filled 2023-06-22: qty 64, 32d supply, fill #0
  Filled 2023-06-22: qty 48, 24d supply, fill #0
  Filled 2023-11-30: qty 30, 15d supply, fill #1
  Filled 2023-12-01: qty 28, 14d supply, fill #1
  Filled 2024-02-01: qty 28, 14d supply, fill #2

## 2023-06-22 MED ORDER — FAMOTIDINE 20 MG PO TABS
20.0000 mg | ORAL_TABLET | Freq: Two times a day (BID) | ORAL | 1 refills | Status: DC
  Filled 2023-06-22: qty 60, 30d supply, fill #0
  Filled 2023-06-30 – 2023-09-11 (×2): qty 60, 30d supply, fill #1

## 2023-06-22 NOTE — Progress Notes (Signed)
 Patient ID: David [name], male   DOB: 23-Apr-1960, 63 y.o.   MRN: [id number]     David [name], is a 63 y.o. male  FAO:[id number]  [id number]  DOB - 09-18-1960  Chief Complaint  Patient presents with   Medical Management of Chronic Issues       Subjective:   David [name] is a 63 y.o. male here today for needing approval for PT and OT he is already receiving s/p MI and CABG X4.  No CP/SOB.  He is working on Physiological scientist.    He needs RF of furosemide until he can see his cardiologist in April.    Blood sugars running about 130s to 160s.  A1C not due yet  Needs RF of miralax and lyrica.    No new issues or concerns.    No problems updated.  ALLERGIES: No Known Allergies  PAST MEDICAL HISTORY: Past Medical History:  Diagnosis Date   Cardiomyopathy (HCC)    a. EF 45% in 2019.   CHF (congestive heart failure) (HCC)    Chronic anticoagulation 09/30/2017   Diabetes mellitus type 2 in nonobese [facility])    Discitis of thoracic region 09/25/2022   Does not have health insurance    Essential hypertension 09/26/2017   Financial difficulties    H/O noncompliance with medical treatment, presenting hazards to health    Hardware complicating wound infection (HCC) 09/25/2022   Non-insulin treated type 2 diabetes mellitus (HCC) 09/26/2017   Persistent atrial fibrillation (HCC)    Sleep apnea suspected 09/30/2017    MEDICATIONS AT HOME: Prior to Admission medications   Medication Sig Start Date End Date Taking? Authorizing Provider  Accu-Chek Softclix Lancets lancets Use to check blood sugar 3 times daily. 11/15/22  Yes [name], MD  ALPRAZolam Prudy Feeler) 0.5 MG tablet Take 1 tablet (0.5 mg total) by mouth at bedtime as needed for anxiety or sleep. 05/02/23  Yes Roddenberry, Cecille Amsterdam, PA-C  apixaban (ELIQUIS) 5 MG TABS tablet Take 1 tablet (5 mg total) by mouth 2 (two) times daily. 05/15/23  Yes Nahser, Deloris Ping, MD  aspirin EC 81 MG tablet Take 1  tablet (81 mg total) by mouth daily. Swallow whole. 05/03/23  Yes Roddenberry, Myron G, PA-C  Blood Glucose Monitoring Suppl (ACCU-CHEK GUIDE) w/Device KIT Use to check blood sugar 3 times daily. E11.9 11/15/22  Yes [name], MD  carvedilol (COREG) 6.25 MG tablet Take 1 tablet (6.25 mg total) by mouth 2 (two) times daily. 06/16/23  Yes Sharlene Dory, PA-C  Continuous Glucose Receiver (DEXCOM G7 RECEIVER) DEVI Use as directed. 03/10/23  Yes [name], MD  Continuous Glucose Sensor (DEXCOM G7 SENSOR) MISC Use as directed & change every 10 days. 03/10/23  Yes [name], MD  dapagliflozin propanediol (FARXIGA) 10 MG TABS tablet Take 1 tablet (10 mg total) by mouth daily before breakfast. 03/06/23  Yes Newlin, Enobong, MD  doxycycline (VIBRA-TABS) 100 MG tablet Take 1 tablet (100 mg total) by mouth every 12 (twelve) hours. 03/20/23  Yes Danelle Earthly, MD  fluticasone (FLONASE) 50 MCG/ACT nasal spray Place 2 sprays into both nostrils daily. 10/21/22  Yes Kathlen Mody, MD  glucose blood (ACCU-CHEK GUIDE) test strip Use to check blood sugar 3 times daily. 11/15/22  Yes [name], MD  HYDROcodone-acetaminophen (NORCO) 10-325 MG tablet Take 1 tablet by mouth every 4 (four) hours as needed for severe pain (pain score 7-10). 05/02/23  Yes Roddenberry, Myron G, PA-C  insulin glargine (LANTUS SOLOSTAR) 100 UNIT/ML  Solostar Pen Inject 27 Units into the skin daily. 03/01/23  Yes Newlin, Odette Horns, MD  Insulin Pen Needle (PEN NEEDLES) 32G X 4 MM MISC Use to inject insulin once daily. 11/15/22  Yes [name], MD  methocarbamol (ROBAXIN) 750 MG tablet Take 1 tablet (750 mg total) by mouth every 8 (eight) hours as needed for muscle spasms. 05/02/23  Yes Leary Roca, PA-C  Misc. 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 05/02/23  Yes Roddenberry, Cecille Amsterdam, PA-C  potassium chloride SA (KLOR-CON M) 20 MEQ tablet Take 2 tablets (40 mEq total) by mouth daily. 05/26/23 06/25/23 Yes Gold, Wayne E, PA-C  rosuvastatin (CRESTOR) 20 MG tablet Take 1 tablet (20 mg total) by mouth daily. 01/10/23  Yes [name], MD  tamsulosin (FLOMAX) 0.4 MG CAPS capsule Take 1 capsule (0.4 mg total) by mouth daily after supper. 03/01/23  Yes [name], MD  XTAMPZA ER 9 MG C12A Take 1 capsule by mouth every 12 (twelve) hours. 04/07/23  Yes [provider]  famotidine (PEPCID) 20 MG tablet Take 1 tablet (20 mg total) by mouth 2 times daily. 06/22/23   David Simmonds, PA-C  furosemide (LASIX) 40 MG tablet Take 1 tablet (40 mg total) by mouth 2 (two) times daily. 06/22/23 07/22/23  David Simmonds, PA-C  polyethylene glycol (MIRALAX / GLYCOLAX) 17 g packet Take 17 g by mouth 2 (two) times daily. 06/22/23   David Simmonds, PA-C  pregabalin (LYRICA) 75 MG capsule Take 1 capsule (75 mg total) by mouth 2 (two) times daily. 06/22/23   David [name], Marzella Schlein, PA-C    ROS: Neg HEENT Neg resp Neg cardiac Neg GI Neg GU Neg MS Neg psych Neg neuro  Objective:   Vitals:   06/22/23 0920 06/22/23 0939  BP: (!) 160/106 (!) 140/94  Pulse: (!) 118   SpO2: 96%   Weight: 182 lb 6.4 oz (82.7 kg)    Exam General appearance : Awake, alert, not in any distress. Speech Clear. Not toxic looking HEENT: Atraumatic and Normocephalic Neck: Supple, no JVD. No cervical lymphadenopathy.  Chest: Good air entry bilaterally, CTAB.  No rales/rhonchi/wheezing CVS: S1 S2 regular, no murmurs.  Extremities: B/L Lower Ext shows no edema, both legs are warm to touch Neurology: Awake alert,  and oriented X 3, CN II-XII intact, Non focal Skin: No Rash  Data Review Lab Results  Component Value Date   HGBA1C 7.4 (H) 04/09/2023   HGBA1C 7.1 (A) 03/01/2023   HGBA1C 5.5 10/08/2022    Assessment & Plan   1. Type 2 diabetes mellitus with other specified complication, with long-term current use of insulin (HCC) (Primary) Pretty good control based on home readings 130-160 - POCT glucose (manual entry) - Basic Metabolic Panel  2. Hypertension associated with diabetes (HCC) OOO readings at <130/<85 - Basic Metabolic Panel  3. 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  Dispense: 60  tablet; Refill: 0  4. Other constipation On 12 hr opiates - polyethylene glycol (MIRALAX / GLYCOLAX) 17 g packet; Take 17 g by mouth 2 (two) times daily.  Dispense: 100 each; Refill: 3  5. Discitis of thoracic region - pregabalin (LYRICA) 75 MG capsule; Take 1 capsule (75 mg total) by mouth 2 (two) times daily.  Dispense: 60 capsule; Refill: 2 - Basic Metabolic Panel   PCP for chronic conditions-Newlin A1C and BP in 6 weeks   The patient was given clear instructions to go to ER or return to medical center if symptoms don't improve, worsen or new problems develop. The patient verbalized understanding. The patient was told to call to get lab results if they haven't heard anything in the next week.      Georgian Co, PA-C [facility] [place], [place] [phone]   06/22/2023, 10:04 AM

## 2023-06-23 ENCOUNTER — Encounter: Payer: Self-pay | Admitting: Physician Assistant

## 2023-06-23 ENCOUNTER — Other Ambulatory Visit: Payer: Self-pay | Admitting: Physician Assistant

## 2023-06-23 DIAGNOSIS — E875 Hyperkalemia: Secondary | ICD-10-CM

## 2023-06-23 LAB — BASIC METABOLIC PANEL
BUN/Creatinine Ratio: 19 (ref 10–24)
BUN: 35 mg/dL — ABNORMAL HIGH (ref 8–27)
CO2: 22 mmol/L (ref 20–29)
Calcium: 10.5 mg/dL — ABNORMAL HIGH (ref 8.6–10.2)
Chloride: 99 mmol/L (ref 96–106)
Creatinine, Ser: 1.87 mg/dL — ABNORMAL HIGH (ref 0.76–1.27)
Glucose: 171 mg/dL — ABNORMAL HIGH (ref 70–99)
Potassium: 5.3 mmol/L — ABNORMAL HIGH (ref 3.5–5.2)
Sodium: 138 mmol/L (ref 134–144)
eGFR: 40 mL/min/{1.73_m2} — ABNORMAL LOW (ref >59)

## 2023-06-27 ENCOUNTER — Other Ambulatory Visit (HOSPITAL_COMMUNITY): Payer: Self-pay

## 2023-06-28 ENCOUNTER — Other Ambulatory Visit (HOSPITAL_COMMUNITY): Payer: Self-pay

## 2023-06-29 ENCOUNTER — Encounter (HOSPITAL_COMMUNITY): Payer: Self-pay | Admitting: Otolaryngology

## 2023-06-29 ENCOUNTER — Other Ambulatory Visit: Payer: Self-pay

## 2023-06-29 NOTE — Progress Notes (Addendum)
 SDW CALL  Patient was given pre-op instructions over the phone. The opportunity was given for the patient to ask questions. No further questions asked. Patient verbalized understanding of instructions given.   PCP - Odette Horns Newlin,MD Cardiologist - Burna Cash - by [facility] PA, Jari Favre, 06/16/23  PPM/ICD - denies Device Orders -  Rep Notified -   Chest x-ray - 05/24/23 EKG - 05/13/23 Stress Test - 09/29/17 ECHO - 04/11/23 Cardiac Cath - 04/12/23  Sleep Study - denies CPAP - no  Fasting Blood Sugar - 130-160 Checks Blood Sugar one time a day  Blood Thinner Instructions:pt reports he last took Eliquis 06/24/23 Aspirin Instructions:pt states he no longer takes aspirin.   ERAS Protcol -no PRE-SURGERY Ensure or G2-   COVID TEST- na   Anesthesia review: yes-hx afib,CAD,MI,DM, CABG   Patient denies shortness of breath, fever, cough and chest pain over the phone call    Surgical Instructions    Your procedure is scheduled on March 28.  Report to [facility] Main Entrance "A" at 0730 A.M., then check in with the Admitting office.  Call this number if you have problems the morning of surgery:  [phone]    Remember:  Do not eat or drink anything after midnight the night before your surgery   Take these medicines the morning of surgery with A SIP OF WATER: Coreg,Pepcid,doxycycline,Lyrica,Crestor,[place] ER PRN- Norco,Robaxin  As of today, STOP taking any Aspirin (unless otherwise instructed by your surgeon) Aleve, Naproxen, Ibuprofen, Motrin, Advil, Goody's, BC's, all herbal medications, fish oil, and all vitamins.   WHAT DO I DO ABOUT MY DIABETES MEDICATION?   Do not take oral diabetes medicines (pills) the morning of surgery. Do not take Farxiga the morning of surgery.  THE MORNING OF SURGERY, take 13units of Lantus insulin.(50% of your normal dose)   Check your blood sugar the morning of your surgery when you wake up and every 2 hours until you get to the  Short Stay unit.  If your blood sugar is less than 70 mg/dL, you will need to treat for low blood sugar: Do not take insulin. Treat a low blood sugar (less than 70 mg/dL) with  cup of clear juice (cranberry or apple), 4 glucose tablets, OR glucose gel. Recheck blood sugar in 15 minutes after treatment (to make sure it is greater than 70 mg/dL). If your blood sugar is not greater than 70 mg/dL on recheck, call [phone] for further instructions. Report your blood sugar to the short stay nurse when you get to Short Stay.  [facility] is not responsible for any belongings or valuables. .   Do NOT Smoke (Tobacco/Vaping)  24 hours prior to your procedure  If you use a CPAP at night, you may bring your mask for your overnight stay.   Contacts, glasses, hearing aids, dentures or partials may not be worn into surgery, please bring cases for these belongings   Patients discharged the day of surgery will not be allowed to drive home, and someone needs to stay with them for 24 hours.   Special instructions:    Oral Hygiene is also important to reduce your risk of infection.  Remember - BRUSH YOUR TEETH THE MORNING OF SURGERY WITH YOUR REGULAR TOOTHPASTE   Day of Surgery:  Take a shower the day of or night before with antibacterial soap. Wear Clean/Comfortable clothing the morning of surgery Do not apply any deodorants/lotions.   Do not wear jewelry or makeup Do not wear lotions, powders,  perfumes/colognes, or deodorant. Do not shave 48 hours prior to surgery.  Men may shave face and neck. Do not bring valuables to the hospital. Do not wear nail polish, gel polish, artificial nails, or any other type of covering on natural nails (fingers and toes) If you have artificial nails or gel coating that need to be removed by a nail salon, please have this removed prior to surgery. Artificial nails or gel coating may interfere with anesthesia's ability to adequately monitor your vital  signs. Remember to brush your teeth WITH YOUR REGULAR TOOTHPASTE.

## 2023-06-29 NOTE — Anesthesia Preprocedure Evaluation (Signed)
 Anesthesia Evaluation  Patient identified by MRN, date of birth, ID band Patient awake    Reviewed: Allergy & Precautions, H&P , NPO status , Patient's Chart, lab work & pertinent test results  Airway Mallampati: II  TM Distance: >3 FB Neck ROM: Full    Dental no notable dental hx. (+) Teeth Intact, Dental Advisory Given   Pulmonary neg pulmonary ROS, former smoker   Pulmonary exam normal breath sounds clear to auscultation       Cardiovascular hypertension, Pt. on medications and Pt. on home beta blockers + CAD, + Past MI, + CABG, + Peripheral Vascular Disease and +CHF   Rhythm:Regular Rate:Normal     Neuro/Psych   Anxiety     negative neurological ROS     GI/Hepatic negative GI ROS, Neg liver ROS,,,  Endo/Other  diabetes, Insulin Dependent    Renal/GU Renal InsufficiencyRenal disease  negative genitourinary   Musculoskeletal  (+) Arthritis , Osteoarthritis,    Abdominal   Peds  Hematology  (+) Blood dyscrasia, anemia   Anesthesia Other Findings   Reproductive/Obstetrics negative OB ROS                             Anesthesia Physical Anesthesia Plan  ASA: 3  Anesthesia Plan: General   Post-op Pain Management: Ofirmev IV (intra-op)*   Induction: Intravenous  PONV Risk Score and Plan: 3 and Ondansetron, Dexamethasone and Midazolam  Airway Management Planned: Oral ETT  Additional Equipment:   Intra-op Plan:   Post-operative Plan: Extubation in OR  Informed Consent: I have reviewed the patients History and Physical, chart, labs and discussed the procedure including the risks, benefits and alternatives for the proposed anesthesia with the patient or authorized representative who has indicated his/her understanding and acceptance.     Dental advisory given  Plan Discussed with: CRNA  Anesthesia Plan Comments: (See PAT note from 3/26)        Anesthesia Quick  Evaluation

## 2023-06-29 NOTE — H&P (Signed)
 HPI:   David [name] is a 63 y.o. male who presents as a new Patient.   Referring Provider: No ref. provider found  Chief complaint: Nasal obstruction.  HPI: History of progressive nasal obstruction on the right side over the past couple of years. No history of sinus disease allergies or nasal trauma. He does have a history of recent cardiac bypass surgery and multilevel fusion of his spine. He denies any nasal drainage.  PMH/Meds/All/SocHx/FamHx/ROS:   Past Medical History:  Diagnosis Date  Atrial fibrillation (CMD)  Cardiomyopathy (CMD)  Diabetes mellitus (CMD)  Hypertension  Sleep apnea   Past Surgical History:  Procedure Laterality Date  FOOT RAY RESECTION Right 03/20/2022  Procedure: RAY RESECTION FOOT; Surgeon: Dial, Dekarlos Megail, DPM; Location: [facility] MAIN OR; Service: Podiatry; Laterality: Right; with local block  TOE AMPUTATION  Procedure: TOE AMPUTATION  VASCULAR SURGERY  Procedure: VASCULAR SURGERY   No family history of bleeding disorders, wound healing problems or difficulty with anesthesia.     Current Outpatient Medications:  apixaban (Eliquis) 5 mg tab, Take 5 mg by mouth 2 (two) times a day., Disp: , Rfl:  atorvastatin (LIPITOR) 80 mg tablet, Take 80 mg by mouth Once Daily., Disp: , Rfl:  carvediloL (COREG) 3.125 mg tablet, Take 3.125 mg by mouth 2 (two) times a day with meals., Disp: , Rfl:  clopidogreL (PLAVIX) 75 mg tablet, Take 75 mg by mouth Once Daily., Disp: , Rfl:  dapagliflozin propanediol (FARXIGA) 10 mg tab tablet, Take 10 mg by mouth., Disp: , Rfl:  Dexcom G7 Receiver misc, , Disp: , Rfl:  Dexcom G7 Sensor, , Disp: , Rfl:  digoxin (LANOXIN) 125 mcg (0.125 mg) tablet, Take 125 mcg by mouth Once Daily., Disp: , Rfl:  doxycycline (VIBRA-TABS) 100 mg tablet, Take 100 mg by mouth every 12 (twelve) hours., Disp: , Rfl:  fluticasone propionate (FLONASE) 50 mcg/spray nasal spray, 2 sprays., Disp: , Rfl:  furosemide (LASIX) 40 mg tablet, Take 40 mg by  mouth Once Daily., Disp: , Rfl:  HYDROcodone-acetaminophen (NORCO) 10-325 mg per tablet, TAKE 1 TABLET EVERY 4 HOURS AS NEEDED FOR PAIN, Disp: , Rfl:  insulin aspart U-100 (NovoLOG FlexPen) 100 unit/mL (3 mL) pen, , Disp: , Rfl:  insulin detemir U-100 (Levemir FlexPen) 100 unit/mL (3 mL) pen injection, INJECT 14 UNITS INTO THE SKIN AT BEDTIME., Disp: , Rfl:  insulin glargine (Basaglar KwikPen U-100 Insulin) 100 unit/mL (3 mL) pen, Inject 40 Units under the skin Once Daily., Disp: , Rfl:  insulin lispro (HumaLOG KwikPen) 100 unit/mL KwikPen, Inject 15 Units under the skin 3 (three) times a day before meals., Disp: , Rfl:  Insupen Pen Needle 32 gauge x 5/32" ndle, , Disp: , Rfl:  methocarbamoL (ROBAXIN) 750 mg tablet, Take 750 mg by mouth., Disp: , Rfl:  naloxone (NARCAN) 4 mg/actuation spry nasal spray, Opiates overdose, Disp: , Rfl:  oxyCODONE (ROXICODONE) 10 mg tab, TAKE 1 TABLET BY MOUTH EVERY 12 HOURS FOR PAIN, Disp: , Rfl:  potassium chloride (KLOR-CON) 20 mEq ER tablet, TAKE 1 TABLET BY MOUTH TWICE DAILY FOR POTASSIUM SUPPLEMENT . DO NOT CRUSH, Disp: , Rfl:  pregabalin (LYRICA) 75 mg capsule, Take 75 mg by mouth., Disp: , Rfl:  rosuvastatin (CRESTOR) 20 mg tablet, Take 20 mg by mouth daily., Disp: , Rfl:  sacubitriL-valsartan (Entresto) 49-51 mg per tablet, Take 1 tablet by mouth 2 (two) times a day., Disp: , Rfl:  Xtampza ER 9 mg CSpT, Take 1 capsule by mouth., Disp: , Rfl:  famotidine (PEPCID)  20 mg tablet, Take 20 mg by mouth 2 (two) times a day for 90 days., Disp: 60 tablet, Rfl: 2 spironolactone (ALDACTONE) 25 mg tablet, Take 25 mg by mouth Once Daily., Disp: , Rfl:   A complete ROS was performed with pertinent positives/negatives noted in the HPI. The remainder of the ROS are negative.   Physical Exam:   BP (!) 146/105  Pulse (!) 121  Temp 97.3 F (36.3 C) (Temporal)  Resp 18  Ht 1.803 m (5\' 11" )  Wt 83.2 kg (183 lb 6.4 oz)  BMI 25.58 kg/m   General: Healthy and alert, in  no distress, breathing easily. Normal affect. In a pleasant mood. Head: Normocephalic, atraumatic. No masses, or scars. Eyes: Pupils are equal, and reactive to light. Vision is grossly intact. No spontaneous or gaze nystagmus. Ears: Ear canals are clear. Tympanic membranes are intact, with normal landmarks and the middle ears are clear and healthy. Hearing: Grossly normal. Nose: Nasal cavities reveal a papilloma at the anterosuperior aspect of the septum right by the nasal opening on the right side. Further back his nasal cavity is completely obstructed. There appears to possibly be a polypoid mass. Face: No masses or scars, facial nerve function is symmetric. Oral Cavity: No mucosal abnormalities are noted. Tongue with normal mobility. Dentition appears healthy. Oropharynx: Tonsils are symmetric. There are no mucosal masses identified. Tongue base appears normal and healthy. Larynx/Hypopharynx: deferred Chest: Deferred Neck: No palpable masses, no cervical adenopathy, no thyroid nodules or enlargement. Neuro: Cranial nerves II-XII with normal function. Balance: Normal gate. Other findings: none.  Independent Review of Additional Tests or Records:  none  Procedures:  none  Impression & Plans:  Papilloma of the right nasal cavity. Severe obstruction of the right nasal cavity from pathology further back. Recommend CT imaging of the sinuses as this is concerning for either neoplasm or polyposis.

## 2023-06-29 NOTE — Progress Notes (Signed)
 Spoke with David [name] in Dr. Lucky Rathke office to see if she has a working phone number for [name]. She said will try to reach him and have him call me. I gave her my phone number [phone].

## 2023-06-29 NOTE — Progress Notes (Signed)
 Case: [id number] Date/Time: 06/30/23 0945   Procedures:      ENDOSCOPY, NOSE (Right)     POLYPECTOMY, NASAL CAVITY (Right)   Anesthesia type: General   Diagnosis:      Nasal obstruction [J34.89]     Nasal mass [J34.89]   Pre-op diagnosis:      Nasal obstruction     Nasal mass   [facility] / [facility]   Surgeons: Serena Colonel, MD       DISCUSSION: David [name] is a 63 yo who is being evaluated as SDW for surgery above. 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  Patient started seeing Cardiology in 2019 when diagnosed with A.fib and cardiomyopathy with now recovered EF. Has had issues with being lost to follow up/medical noncompliance. He was admitted from 04/09/23-05/02/23 for for NSTEMI and acute on chronic CHF and ultimately underwent a CABG on 04/18/23 after cath showed severe multivessel CAD. Post op course complicated by AKI on CKD possibly from ATN. Renal function recovered to baseline. Patient seen by Cardiology for follow up on 3/14. Patient still recovering from surgery but noted to be stable. Cleared for surgery and advised f/u in 3 months: "Nasal polyp causing breathing difficulties and loss of taste. Scheduled for surgical removal. - Coordinate with preoperative department for nasal polyp removal. - Hold Eliquis for 1-2 days prior to surgery."  Seen by CT surgery on 2/19. Patient with expected post op soreness and slow progression. Advised f/u prn.  Seen by PCP on 3/20 for routine f/u. Stable at that visit. Last A1c was 7.2. BP controlled.  VS: There were no vitals taken for this visit.  PROVIDERS: [name], MD Cardiologist:  Charlton Haws, MD   LABS: 3/20 labs:  K mildly elevated to 5.3. Kidney function at baseline (Scr 1.8). BMP ordered for DOS. Anemia  on labs from 2/8 (Hgb 8.7). Consider CBC DOS   IMAGES:  CXR 05/24/23:  IMPRESSION: No active cardiopulmonary disease.    EKG 05/13/23:  Atrial fibrillation, rate 93 Non-specific ST-t changes  CV:  Echo 04/11/23: IMPRESSIONS     1. Left ventricular ejection fraction, by estimation, is 65 to 70%. The  left ventricle has normal function. The left ventricle has no regional  wall motion abnormalities. There is mild concentric left ventricular  hypertrophy. Left ventricular diastolic  function could not be evaluated.   2. Right ventricular systolic function is mildly reduced. The right  ventricular size is mildly enlarged. Tricuspid regurgitation signal is  inadequate for assessing PA pressure.   3. Left atrial size was moderately dilated.   4. The mitral valve is grossly normal. Mild mitral valve regurgitation.  No evidence of mitral stenosis.   5. The aortic valve is tricuspid. Aortic valve regurgitation is not  visualized. No aortic stenosis is present.   6. The inferior vena cava is dilated in size with <50% respiratory  variability, suggesting right atrial pressure of 15 mmHg.   Comparison(s): No significant change from prior study.    Past Medical History:  Diagnosis Date   Cardiomyopathy (HCC)    a. EF 45% in 2019.   CHF (congestive heart failure) (HCC)    Chronic anticoagulation 09/30/2017   Diabetes mellitus type 2 in nonobese [facility])    Discitis of thoracic region 09/25/2022  Does not have health insurance    Essential hypertension 09/26/2017   Financial difficulties    H/O noncompliance with medical treatment, presenting hazards to health    Hardware complicating wound infection (HCC) 09/25/2022   Non-insulin treated type 2 diabetes mellitus (HCC) 09/26/2017   Persistent atrial fibrillation (HCC)    Sleep apnea suspected 09/30/2017    Past Surgical History:  Procedure Laterality Date   ABDOMINAL AORTOGRAM W/LOWER EXTREMITY N/A 09/02/2021   Procedure:  ABDOMINAL AORTOGRAM W/LOWER EXTREMITY;  Surgeon: Nada Libman, MD;  [facility];  Service: Cardiovascular;  Laterality: N/A;   ABDOMINAL AORTOGRAM W/LOWER EXTREMITY N/A 12/28/2021   Procedure: ABDOMINAL AORTOGRAM W/LOWER EXTREMITY;  Surgeon: Nada Libman, MD;  [facility];  Service: Cardiovascular;  Laterality: N/A;   ABDOMINAL AORTOGRAM W/LOWER EXTREMITY N/A 07/28/2022   Procedure: ABDOMINAL AORTOGRAM W/LOWER EXTREMITY;  Surgeon: Cephus Shelling, MD;  [facility];  Service: Cardiovascular;  Laterality: N/A;   AMPUTATION Right 09/04/2021   Procedure: 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;  Surgeon: Nadara Mustard, MD;  Location: [facility] OR;  Service: Orthopedics;  Laterality: Right;   AMPUTATION Right 08/02/2022   Procedure: AMPUTATION RAY 5TH METATARSAL OF RIGHT FOOT;  Surgeon: [name], MD;  [facility];  Service: Orthopedics;  Laterality: Right;   APPENDECTOMY  1971   BONE BIOPSY Right 08/02/2022   Procedure: BONE BIOPSY OF 5TH METATARSAL RIGHT FOOT;  Surgeon: [name], MD;  [facility];  Service: Orthopedics;  Laterality: Right;   CARDIOVERSION N/A 09/28/2017   Procedure: CARDIOVERSION;  Surgeon: Thurmon Fair, MD;  [facility];  Service: Cardiovascular;  Laterality: N/A;   CLIPPING OF ATRIAL APPENDAGE N/A 04/18/2023   Procedure: CLIPPING OF ATRIAL APPENDAGE USING ATRICURE ATRICLIP [id number] with pulmonary vein isolation ablation;  Surgeon: Alleen Borne, MD;  [facility];  Service: Open Heart Surgery;  Laterality: N/A;   CORONARY ARTERY BYPASS GRAFT N/A 04/18/2023   Procedure: CORONARY ARTERY BYPASS GRAFTING (CABG) TIMES FOUR USING LEFT INTERNAL MAMMARY ARTERY AND ENDOSCOPICALLY HARVESTED LEFT GREATER SAPHENOUS VEIN;  Surgeon: Alleen Borne, MD;  [facility];  Service: Open Heart Surgery;  Laterality: N/A;   IRRIGATION AND DEBRIDEMENT FOOT  08/02/2022   Procedure: IRRIGATION AND DEBRIDEMENT RIGHT 5TH  METATARSAL FOOT;  Surgeon: [name], MD;  [facility];  Service: Orthopedics;;   LAMINECTOMY WITH POSTERIOR LATERAL ARTHRODESIS LEVEL 4 N/A 08/30/2022   Procedure: THORACIC FIVE-THORACIC ELEVEN FUSION, THORACIC EIGHT TRANSPEDICULAR DECOMPRESSION AND PARTIAL CORPECTOMY with O-Arm;  Surgeon: Bedelia Person, MD;  Location: [facility] OR;  Service: Neurosurgery;  Laterality: N/A;   LEFT HEART CATH AND CORONARY ANGIOGRAPHY N/A 04/12/2023   Procedure: LEFT HEART CATH AND CORONARY ANGIOGRAPHY;  Surgeon: Kathleene Hazel, MD;  [facility];  Service: Cardiovascular;  Laterality: N/A;   PERIPHERAL VASCULAR BALLOON ANGIOPLASTY  09/02/2021   Procedure: PERIPHERAL VASCULAR BALLOON ANGIOPLASTY;  Surgeon: Nada Libman, MD;  [facility];  Service: Cardiovascular;;   PERIPHERAL VASCULAR BALLOON ANGIOPLASTY  12/28/2021   Procedure: PERIPHERAL VASCULAR BALLOON ANGIOPLASTY;  Surgeon: Nada Libman, MD;  [facility];  Service: Cardiovascular;;  Posterior Tibial PTA only   TEE WITHOUT CARDIOVERSION N/A 09/28/2017   Procedure: TRANSESOPHAGEAL ECHOCARDIOGRAM (TEE);  Surgeon: Thurmon Fair, MD;  Location: [facility] ENDOSCOPY;  Service: Cardiovascular;  Laterality: N/A;   TEE WITHOUT CARDIOVERSION N/A 09/03/2021   Procedure: TRANSESOPHAGEAL ECHOCARDIOGRAM (TEE);  Surgeon: Chilton Si, MD;  [facility];  Service: Cardiovascular;  Laterality: N/A;   TEE WITHOUT CARDIOVERSION N/A 07/15/2022   Procedure: TRANSESOPHAGEAL ECHOCARDIOGRAM;  Surgeon: Thurmon Fair, MD;  [facility];  Service: Cardiovascular;  Laterality: N/A;   TEE WITHOUT CARDIOVERSION N/A 04/18/2023   Procedure: TRANSESOPHAGEAL ECHOCARDIOGRAM (TEE);  Surgeon: Alleen Borne, MD;  Location: [facility] OR;  Service: Open Heart Surgery;  Laterality: N/A;    MEDICATIONS: No current facility-administered medications for this encounter.    Accu-Chek Softclix Lancets  lancets   ALPRAZolam (XANAX) 0.5 MG tablet   apixaban (ELIQUIS) 5 MG TABS tablet   aspirin EC 81 MG tablet   Blood Glucose Monitoring Suppl (ACCU-CHEK GUIDE) w/Device KIT   carvedilol (COREG) 6.25 MG tablet   Continuous Glucose Receiver (DEXCOM G7 RECEIVER) DEVI   Continuous Glucose Sensor (DEXCOM G7 SENSOR) MISC   dapagliflozin propanediol (FARXIGA) 10 MG TABS tablet   doxycycline (VIBRA-TABS) 100 MG tablet   famotidine (PEPCID) 20 MG tablet   fluticasone (FLONASE) 50 MCG/ACT nasal spray   furosemide (LASIX) 40 MG tablet   glucose blood (ACCU-CHEK GUIDE) test strip   HYDROcodone-acetaminophen (NORCO) 10-325 MG tablet   insulin glargine (LANTUS SOLOSTAR) 100 UNIT/ML Solostar Pen   Insulin Pen Needle (PEN NEEDLES) 32G X 4 MM MISC   methocarbamol (ROBAXIN) 750 MG tablet   Misc. Devices MISC   naloxone (NARCAN) nasal spray 4 mg/0.1 mL   oxyCODONE (OXYCONTIN) 10 mg 12 hr tablet   polyethylene glycol (MIRALAX / GLYCOLAX) 17 g packet   potassium chloride SA (KLOR-CON M) 20 MEQ tablet   pregabalin (LYRICA) 75 MG capsule   rosuvastatin (CRESTOR) 20 MG tablet   tamsulosin (FLOMAX) 0.4 MG CAPS capsule   XTAMPZA ER 9 MG C12A   Marcille Blanco [facility] Surgical Short Stay/Anesthesiology [facility] Phone [phone] 06/29/2023 12:37 PM

## 2023-06-30 ENCOUNTER — Other Ambulatory Visit: Payer: Self-pay | Admitting: Family Medicine

## 2023-06-30 ENCOUNTER — Other Ambulatory Visit: Payer: Self-pay

## 2023-06-30 ENCOUNTER — Encounter (HOSPITAL_COMMUNITY)
Admission: RE | Disposition: A | Discharge status: Home / Self Care | Payer: Self-pay | Source: Home / Self Care | Attending: Otolaryngology

## 2023-06-30 ENCOUNTER — Ambulatory Visit (HOSPITAL_COMMUNITY)
Admission: RE | Admit: 2023-06-30 | Discharge: 2023-06-30 | Disposition: A | Discharge status: Home / Self Care | Attending: Otolaryngology | Admitting: Otolaryngology

## 2023-06-30 ENCOUNTER — Other Ambulatory Visit (HOSPITAL_COMMUNITY): Payer: Self-pay

## 2023-06-30 ENCOUNTER — Ambulatory Visit (HOSPITAL_COMMUNITY): Payer: Self-pay | Admitting: Medical

## 2023-06-30 ENCOUNTER — Encounter (HOSPITAL_COMMUNITY): Payer: Self-pay | Admitting: Otolaryngology

## 2023-06-30 ENCOUNTER — Ambulatory Visit (HOSPITAL_BASED_OUTPATIENT_CLINIC_OR_DEPARTMENT_OTHER): Payer: Self-pay | Admitting: Medical

## 2023-06-30 DIAGNOSIS — I252 Old myocardial infarction: Secondary | ICD-10-CM | POA: Insufficient documentation

## 2023-06-30 DIAGNOSIS — J3489 Other specified disorders of nose and nasal sinuses: Secondary | ICD-10-CM | POA: Diagnosis present

## 2023-06-30 DIAGNOSIS — I2510 Atherosclerotic heart disease of native coronary artery without angina pectoris: Secondary | ICD-10-CM | POA: Insufficient documentation

## 2023-06-30 DIAGNOSIS — Z79899 Other long term (current) drug therapy: Secondary | ICD-10-CM | POA: Diagnosis not present

## 2023-06-30 DIAGNOSIS — Z7902 Long term (current) use of antithrombotics/antiplatelets: Secondary | ICD-10-CM | POA: Diagnosis not present

## 2023-06-30 DIAGNOSIS — Z7984 Long term (current) use of oral hypoglycemic drugs: Secondary | ICD-10-CM | POA: Diagnosis not present

## 2023-06-30 DIAGNOSIS — M1990 Unspecified osteoarthritis, unspecified site: Secondary | ICD-10-CM | POA: Diagnosis not present

## 2023-06-30 DIAGNOSIS — Z951 Presence of aortocoronary bypass graft: Secondary | ICD-10-CM | POA: Diagnosis not present

## 2023-06-30 DIAGNOSIS — I110 Hypertensive heart disease with heart failure: Secondary | ICD-10-CM

## 2023-06-30 DIAGNOSIS — E1151 Type 2 diabetes mellitus with diabetic peripheral angiopathy without gangrene: Secondary | ICD-10-CM | POA: Insufficient documentation

## 2023-06-30 DIAGNOSIS — I4819 Other persistent atrial fibrillation: Secondary | ICD-10-CM | POA: Diagnosis not present

## 2023-06-30 DIAGNOSIS — Z794 Long term (current) use of insulin: Secondary | ICD-10-CM | POA: Diagnosis not present

## 2023-06-30 DIAGNOSIS — D649 Anemia, unspecified: Secondary | ICD-10-CM | POA: Insufficient documentation

## 2023-06-30 DIAGNOSIS — I5033 Acute on chronic diastolic (congestive) heart failure: Secondary | ICD-10-CM

## 2023-06-30 DIAGNOSIS — D140 Benign neoplasm of middle ear, nasal cavity and accessory sinuses: Secondary | ICD-10-CM | POA: Diagnosis not present

## 2023-06-30 DIAGNOSIS — Z87891 Personal history of nicotine dependence: Secondary | ICD-10-CM | POA: Diagnosis not present

## 2023-06-30 DIAGNOSIS — I509 Heart failure, unspecified: Secondary | ICD-10-CM | POA: Diagnosis not present

## 2023-06-30 HISTORY — PX: POLYPECTOMY: SHX149

## 2023-06-30 HISTORY — PX: NASAL ENDOSCOPY: SHX6577

## 2023-06-30 LAB — CBC
HCT: 45.9 % (ref 39.0–52.0)
Hemoglobin: 15.4 g/dL (ref 13.0–17.0)
MCH: 30.4 pg (ref 26.0–34.0)
MCHC: 33.6 g/dL (ref 30.0–36.0)
MCV: 90.7 fL (ref 80.0–100.0)
Platelets: 231 10*3/uL (ref 150–400)
RBC: 5.06 MIL/uL (ref 4.22–5.81)
RDW: 13.4 % (ref 11.5–15.5)
WBC: 7.3 10*3/uL (ref 4.0–10.5)
nRBC: 0 % (ref 0.0–0.2)

## 2023-06-30 LAB — GLUCOSE, CAPILLARY
Glucose-Capillary: 137 mg/dL — ABNORMAL HIGH (ref 70–99)
Glucose-Capillary: 135 mg/dL — ABNORMAL HIGH (ref 70–99)

## 2023-06-30 LAB — BASIC METABOLIC PANEL WITH GFR
Anion gap: 15 (ref 5–15)
BUN: 38 mg/dL — ABNORMAL HIGH (ref 8–23)
CO2: 19 mmol/L — ABNORMAL LOW (ref 22–32)
Calcium: 9.8 mg/dL (ref 8.9–10.3)
Chloride: 102 mmol/L (ref 98–111)
Creatinine, Ser: 1.93 mg/dL — ABNORMAL HIGH (ref 0.61–1.24)
GFR, Estimated: 39 mL/min — ABNORMAL LOW (ref >60)
Glucose, Bld: 160 mg/dL — ABNORMAL HIGH (ref 70–99)
Potassium: 3.6 mmol/L (ref 3.5–5.1)
Sodium: 136 mmol/L (ref 135–145)

## 2023-06-30 LAB — SURGICAL PCR SCREEN
MRSA, PCR: NEGATIVE
Staphylococcus aureus: NEGATIVE

## 2023-06-30 SURGERY — ENDOSCOPY, NOSE
Anesthesia: General | Laterality: Right

## 2023-06-30 MED ORDER — FENTANYL CITRATE (PF) 250 MCG/5ML IJ SOLN
INTRAMUSCULAR | Status: DC | PRN
  Administered 2023-06-30 (×4): 50 ug via INTRAVENOUS

## 2023-06-30 MED ORDER — PROPOFOL 10 MG/ML IV BOLUS
INTRAVENOUS | Status: AC
  Filled 2023-06-30: qty 20

## 2023-06-30 MED ORDER — LABETALOL HCL 5 MG/ML IV SOLN
INTRAVENOUS | Status: AC
  Filled 2023-06-30: qty 4

## 2023-06-30 MED ORDER — ONDANSETRON HCL 4 MG/2ML IJ SOLN
INTRAMUSCULAR | Status: AC
  Filled 2023-06-30: qty 2

## 2023-06-30 MED ORDER — LIDOCAINE 2% (20 MG/ML) 5 ML SYRINGE
INTRAMUSCULAR | Status: AC
  Filled 2023-06-30: qty 5

## 2023-06-30 MED ORDER — PHENYLEPHRINE HCL-NACL 20-0.9 MG/250ML-% IV SOLN
INTRAVENOUS | Status: DC | PRN
  Administered 2023-06-30: 50 ug/min via INTRAVENOUS

## 2023-06-30 MED ORDER — OXYMETAZOLINE HCL 0.05 % NA SOLN
NASAL | Status: DC | PRN
  Administered 2023-06-30: 1 via TOPICAL

## 2023-06-30 MED ORDER — FENTANYL CITRATE (PF) 250 MCG/5ML IJ SOLN
INTRAMUSCULAR | Status: AC
  Filled 2023-06-30: qty 5

## 2023-06-30 MED ORDER — HYDROMORPHONE HCL 1 MG/ML IJ SOLN
INTRAMUSCULAR | Status: DC
Start: 2023-06-30 — End: 2023-06-30
  Filled 2023-06-30: qty 1

## 2023-06-30 MED ORDER — LIDOCAINE-EPINEPHRINE 1 %-1:100000 IJ SOLN
INTRAMUSCULAR | Status: AC
  Filled 2023-06-30: qty 1

## 2023-06-30 MED ORDER — ROSUVASTATIN CALCIUM 20 MG PO TABS
20.0000 mg | ORAL_TABLET | Freq: Every day | ORAL | 1 refills | Status: DC
  Filled 2023-06-30 – 2023-10-01 (×2): qty 90, 90d supply, fill #0

## 2023-06-30 MED ORDER — OXYMETAZOLINE HCL 0.05 % NA SOLN
2.0000 | NASAL | Status: DC
  Administered 2023-06-30: 2 via NASAL
  Filled 2023-06-30: qty 30

## 2023-06-30 MED ORDER — KETAMINE HCL 10 MG/ML IJ SOLN
INTRAMUSCULAR | Status: DC | PRN
  Administered 2023-06-30: 20 mg via INTRAVENOUS
  Administered 2023-06-30: 10 mg via INTRAVENOUS

## 2023-06-30 MED ORDER — LIDOCAINE-EPINEPHRINE 1 %-1:100000 IJ SOLN
INTRAMUSCULAR | Status: DC | PRN
  Administered 2023-06-30: 2 mL

## 2023-06-30 MED ORDER — SUCCINYLCHOLINE CHLORIDE 200 MG/10ML IV SOSY
PREFILLED_SYRINGE | INTRAVENOUS | Status: AC
  Filled 2023-06-30: qty 10

## 2023-06-30 MED ORDER — LIDOCAINE 2% (20 MG/ML) 5 ML SYRINGE
INTRAMUSCULAR | Status: DC | PRN
  Administered 2023-06-30: 100 mg via INTRAVENOUS

## 2023-06-30 MED ORDER — CHLORHEXIDINE GLUCONATE 0.12 % MT SOLN
15.0000 mL | Freq: Once | OROMUCOSAL | Status: AC
  Administered 2023-06-30: 15 mL via OROMUCOSAL
  Filled 2023-06-30: qty 15

## 2023-06-30 MED ORDER — LABETALOL HCL 5 MG/ML IV SOLN
5.0000 mg | INTRAVENOUS | Status: DC | PRN
  Administered 2023-06-30 (×2): 5 mg via INTRAVENOUS

## 2023-06-30 MED ORDER — DEXAMETHASONE SODIUM PHOSPHATE 10 MG/ML IJ SOLN
INTRAMUSCULAR | Status: DC | PRN
  Administered 2023-06-30: 10 mg via INTRAVENOUS

## 2023-06-30 MED ORDER — ACETAMINOPHEN 10 MG/ML IV SOLN
INTRAVENOUS | Status: DC | PRN
  Administered 2023-06-30: 1000 mg via INTRAVENOUS

## 2023-06-30 MED ORDER — ONDANSETRON HCL 4 MG/2ML IJ SOLN
INTRAMUSCULAR | Status: DC | PRN
  Administered 2023-06-30: 4 mg via INTRAVENOUS

## 2023-06-30 MED ORDER — HEMOSTATIC AGENTS (NO CHARGE) OPTIME
TOPICAL | Status: DC | PRN
Start: 2023-06-30 — End: 2023-06-30
  Administered 2023-06-30: 1 via TOPICAL

## 2023-06-30 MED ORDER — ESMOLOL HCL 100 MG/10ML IV SOLN
INTRAVENOUS | Status: DC | PRN
  Administered 2023-06-30 (×2): 10 mg via INTRAVENOUS

## 2023-06-30 MED ORDER — SODIUM CHLORIDE 0.9 % IV SOLN: INTRAVENOUS | Status: DC

## 2023-06-30 MED ORDER — HYDROMORPHONE HCL 1 MG/ML IJ SOLN
0.2500 mg | INTRAMUSCULAR | Status: DC | PRN
  Administered 2023-06-30 (×4): 0.5 mg via INTRAVENOUS

## 2023-06-30 MED ORDER — MIDAZOLAM HCL 2 MG/2ML IJ SOLN
INTRAMUSCULAR | Status: AC
  Filled 2023-06-30: qty 2

## 2023-06-30 MED ORDER — BACITRACIN ZINC 500 UNIT/GM EX OINT
TOPICAL_OINTMENT | CUTANEOUS | Status: AC
  Filled 2023-06-30: qty 28.35

## 2023-06-30 MED ORDER — SUGAMMADEX SODIUM 200 MG/2ML IV SOLN
INTRAVENOUS | Status: DC | PRN
  Administered 2023-06-30 (×2): 100 mg via INTRAVENOUS

## 2023-06-30 MED ORDER — ROCURONIUM BROMIDE 10 MG/ML (PF) SYRINGE
PREFILLED_SYRINGE | INTRAVENOUS | Status: AC
  Filled 2023-06-30: qty 10

## 2023-06-30 MED ORDER — SODIUM CHLORIDE 0.9 % IR SOLN
Status: DC | PRN
  Administered 2023-06-30: 1000 mL

## 2023-06-30 MED ORDER — PROPOFOL 10 MG/ML IV BOLUS
INTRAVENOUS | Status: DC | PRN
  Administered 2023-06-30: 30 mg via INTRAVENOUS
  Administered 2023-06-30: 150 mg via INTRAVENOUS

## 2023-06-30 MED ORDER — DEXAMETHASONE SODIUM PHOSPHATE 10 MG/ML IJ SOLN
INTRAMUSCULAR | Status: AC
  Filled 2023-06-30: qty 1

## 2023-06-30 MED ORDER — INSULIN ASPART 100 UNIT/ML IJ SOLN: 0.0000 [IU] | INTRAMUSCULAR | Status: DC | PRN

## 2023-06-30 MED ORDER — ORAL CARE MOUTH RINSE: 15.0000 mL | Freq: Once | OROMUCOSAL | Status: AC

## 2023-06-30 MED ORDER — MIDAZOLAM HCL 2 MG/2ML IJ SOLN
INTRAMUSCULAR | Status: DC | PRN
  Administered 2023-06-30: 2 mg via INTRAVENOUS

## 2023-06-30 MED ORDER — KETAMINE HCL 50 MG/5ML IJ SOSY
PREFILLED_SYRINGE | INTRAMUSCULAR | Status: AC
  Filled 2023-06-30: qty 5

## 2023-06-30 MED ORDER — ACETAMINOPHEN 10 MG/ML IV SOLN
INTRAVENOUS | Status: AC
  Filled 2023-06-30: qty 100

## 2023-06-30 MED ORDER — HYDROMORPHONE HCL 1 MG/ML IJ SOLN
INTRAMUSCULAR | Status: AC
  Filled 2023-06-30: qty 1

## 2023-06-30 MED ORDER — ROCURONIUM BROMIDE 10 MG/ML (PF) SYRINGE
PREFILLED_SYRINGE | INTRAVENOUS | Status: DC | PRN
  Administered 2023-06-30: 50 mg via INTRAVENOUS

## 2023-06-30 SURGICAL SUPPLY — 41 items
ATTRACTOMAT 16X20 MAGNETIC DRP (DRAPES) IMPLANT
BAG COUNTER SPONGE SURGICOUNT (BAG) ×2 IMPLANT
BLADE RAD40 ROTATE 4M 4 5PK (BLADE) IMPLANT
BLADE RAD60 ROTATE M4 4 5PK (BLADE) IMPLANT
BLADE SURG 15 STRL LF DISP TIS (BLADE) IMPLANT
BLADE TRICUT ROTATE M4 4 5PK (BLADE) IMPLANT
CANISTER SUCT 3000ML PPV (MISCELLANEOUS) ×2 IMPLANT
COAGULATOR SUCT SWTCH 10FR 6 (ELECTROSURGICAL) ×2 IMPLANT
CORD BIPOLAR FORCEPS 12FT (ELECTRODE) IMPLANT
DRAPE HALF SHEET 40X57 (DRAPES) IMPLANT
DRESSING NASAL KENNEDY 3.5X.9 (MISCELLANEOUS) IMPLANT
DRESSING NASAL POPE 10X1.5X2.5 (GAUZE/BANDAGES/DRESSINGS) IMPLANT
DRSG NASAL KENNEDY 3.5X.9 (MISCELLANEOUS) IMPLANT
DRSG NASAL POPE 10X1.5X2.5 (GAUZE/BANDAGES/DRESSINGS) IMPLANT
DRSG NASOPORE 8CM (GAUZE/BANDAGES/DRESSINGS) IMPLANT
ELECT REM PT RETURN 9FT ADLT (ELECTROSURGICAL) ×1 IMPLANT
ELECTRODE REM PT RTRN 9FT ADLT (ELECTROSURGICAL) IMPLANT
FILTER ARTHROSCOPY CONVERTOR (FILTER) IMPLANT
FORCEPS BIPOLAR BAYONET STR (FORCEP) IMPLANT
GLOVE ECLIPSE 7.5 STRL STRAW (GLOVE) ×4 IMPLANT
GOWN STRL REUS W/ TWL LRG LVL3 (GOWN DISPOSABLE) ×4 IMPLANT
HEMOSTAT SURGICEL 2X14 (HEMOSTASIS) IMPLANT
KIT BASIN OR (CUSTOM PROCEDURE TRAY) ×2 IMPLANT
KIT TURNOVER KIT B (KITS) ×2 IMPLANT
MARKER SKIN DUAL TIP RULER LAB (MISCELLANEOUS) ×2 IMPLANT
NDL PRECISIONGLIDE 27X1.5 (NEEDLE) ×2 IMPLANT
NEEDLE PRECISIONGLIDE 27X1.5 (NEEDLE) ×1 IMPLANT
NS IRRIG 1000ML POUR BTL (IV SOLUTION) ×2 IMPLANT
PAD ARMBOARD POSITIONER FOAM (MISCELLANEOUS) ×4 IMPLANT
PATTIES SURGICAL .5 X3 (DISPOSABLE) ×2 IMPLANT
SHEATH ENDOSCRUB 0 DEG (SHEATH) IMPLANT
SHEATH ENDOSCRUB 30 DEG (SHEATH) IMPLANT
SPECIMEN JAR SMALL (MISCELLANEOUS) ×2 IMPLANT
SWAB COLLECTION DEVICE MRSA (MISCELLANEOUS) IMPLANT
SWAB CULTURE ESWAB REG 1ML (MISCELLANEOUS) IMPLANT
SYR 50ML SLIP (SYRINGE) IMPLANT
TOWEL GREEN STERILE FF (TOWEL DISPOSABLE) ×2 IMPLANT
TRAY ENT MC OR (CUSTOM PROCEDURE TRAY) ×2 IMPLANT
TUBE CONNECTING 12X1/4 (SUCTIONS) ×2 IMPLANT
TUBING EXTENTION W/L.L. (IV SETS) ×2 IMPLANT
WATER STERILE IRR 1000ML POUR (IV SOLUTION) ×2 IMPLANT

## 2023-06-30 NOTE — Anesthesia Procedure Notes (Signed)
 Procedure Name: Intubation Date/Time: 06/30/2023 9:54 AM  Performed by: Susy Manor, CRNAPre-anesthesia Checklist: Patient identified, Emergency Drugs available, Suction available and Patient being monitored Patient Re-evaluated:Patient Re-evaluated prior to induction Oxygen Delivery Method: Circle System Utilized Preoxygenation: Pre-oxygenation with 100% oxygen Induction Type: IV induction Ventilation: Mask ventilation without difficulty Laryngoscope Size: Mac and 4 Grade View: Grade III Tube type: Oral Tube size: 7.5 mm Number of attempts: 1 Airway Equipment and Method: Stylet and Oral airway Placement Confirmation: ETT inserted through vocal cords under direct vision, positive ETCO2 and breath sounds checked- equal and bilateral Secured at: 23 cm Tube secured with: Tape Dental Injury: Teeth and Oropharynx as per pre-operative assessment

## 2023-06-30 NOTE — Transfer of Care (Signed)
 Immediate Anesthesia Transfer of Care Note  Patient: David [name]  Procedure(s) Performed: ENDOSCOPY, NOSE (Right) POLYPECTOMY, NASAL CAVITY (Right)  Patient Location: PACU  Anesthesia Type:General  Level of Consciousness: awake, alert , and oriented  Airway & Oxygen Therapy: Patient Spontanous Breathing and Patient connected to face mask oxygen  Post-op Assessment: Report given to RN and Post -op Vital signs reviewed and stable  Post vital signs: Reviewed and stable  Last Vitals:  Vitals Value Taken Time  BP 156/106 06/30/23 1052  Temp 36.7 C 06/30/23 1052  Pulse 103 06/30/23 1058  Resp 10 06/30/23 1058  SpO2 92 % 06/30/23 1058  Vitals shown include unfiled device data.  Last Pain:  Vitals:   06/30/23 0849  TempSrc:   PainSc: 0-No pain         Complications: No notable events documented.

## 2023-06-30 NOTE — Op Note (Signed)
 OPERATIVE REPORT  DATE OF SURGERY: 06/30/2023  PATIENT:  David [name],  63 y.o. male  PRE-OPERATIVE DIAGNOSIS:  Nasal obstruction Nasal mass  POST-OPERATIVE DIAGNOSIS:  Nasal obstructionNasal mass  PROCEDURE:  Procedure(s): ENDOSCOPY, NOSE POLYPECTOMY, NASAL CAVITY, right  SURGEON:  Susy Frizzle, MD  ASSISTANTS: None  ANESTHESIA:   General   EBL: 200 ml  DRAINS: None  LOCAL MEDICATIONS USED: 1% Xylocaine with epinephrine  SPECIMEN: Right nasal cavity mass  COUNTS:  Correct  PROCEDURE DETAILS: The patient was taken to the operating room and placed on the operating table in the supine position. Following induction of general endotracheal anesthesia, the nose was draped in the standard fashion.  Oxymetazoline spray was used preoperatively in the nasal cavities.  A 0 degree and 30 degree nasal endoscope used throughout the procedure.  Large polypoid mass was easily identified completely obstructing the right nasal cavity.  Had a papillary configuration to it.  Large fragments were removed using while Blakesley forceps.  The microdebrider was then used to remove the rest of the mass.  It was followed up into the infundibulum.  It did not seem to extend beyond the infundibulum.  A large posterior aspect of the mass was removed using forceps as well.  The complete mass was removed.  There was some bleeding from the undersurface of the middle turbinate.  This was treated with suction cautery and then Surgicel and a nasal pore dressing.  The pharynx was suctioned blood and secretions.  The patient was awakened extubated and transferred to recovery in stable condition.    PATIENT DISPOSITION:  To PACU, stable

## 2023-06-30 NOTE — Discharge Instructions (Signed)
Use nasal saline spray every 30-60 minutes while awake

## 2023-06-30 NOTE — Interval H&P Note (Signed)
 History and Physical Interval Note:  06/30/2023 9:15 AM  David [name]  has presented today for surgery, with the diagnosis of Nasal obstruction Nasal mass.  The various methods of treatment have been discussed with the patient and family. After consideration of risks, benefits and other options for treatment, the patient has consented to  Procedure(s): ENDOSCOPY, NOSE (Right) POLYPECTOMY, NASAL CAVITY (Right) as a surgical intervention.  The patient's history has been reviewed, patient examined, no change in status, stable for surgery.  I have reviewed the patient's chart and labs.  Questions were answered to the patient's satisfaction.     Serena Colonel

## 2023-06-30 NOTE — Anesthesia Postprocedure Evaluation (Signed)
 Anesthesia Post Note  Patient: David [name]  Procedure(s) Performed: ENDOSCOPY, NOSE (Right) POLYPECTOMY, NASAL CAVITY (Right)     Patient location during evaluation: PACU Anesthesia Type: General Level of consciousness: awake and alert Pain management: pain level controlled Vital Signs Assessment: post-procedure vital signs reviewed and stable Respiratory status: spontaneous breathing, nonlabored ventilation and respiratory function stable Cardiovascular status: blood pressure returned to baseline and stable Postop Assessment: no apparent nausea or vomiting Anesthetic complications: no  No notable events documented.  Last Vitals:  Vitals:   06/30/23 1130 06/30/23 1145  BP: (!) 123/96 (!) 137/102  Pulse: 92 81  Resp: 15 11  Temp:  36.7 C  SpO2: 95% 93%    Last Pain:  Vitals:   06/30/23 1130  TempSrc:   PainSc: 7                  Claxton Levitz,W. EDMOND

## 2023-07-01 ENCOUNTER — Encounter (HOSPITAL_COMMUNITY): Payer: Self-pay | Admitting: Otolaryngology

## 2023-07-02 ENCOUNTER — Other Ambulatory Visit: Payer: Self-pay | Admitting: Family Medicine

## 2023-07-03 ENCOUNTER — Other Ambulatory Visit (HOSPITAL_COMMUNITY): Payer: Self-pay

## 2023-07-04 LAB — SURGICAL PATHOLOGY

## 2023-07-06 ENCOUNTER — Encounter: Payer: Self-pay | Admitting: Pharmacist

## 2023-07-06 ENCOUNTER — Ambulatory Visit: Payer: Medicaid Other | Attending: Cardiology | Admitting: Pharmacist

## 2023-07-06 DIAGNOSIS — E7800 Pure hypercholesterolemia, unspecified: Secondary | ICD-10-CM

## 2023-07-06 NOTE — Progress Notes (Signed)
 Patient ID: JAIVEER PANAS                 DOB: 23-Sep-1960                    MRN: [id number]      HPI: David [name] is a 63 y.o. male patient referred to lipid clinic by Gillian Shields, NP. PMH is significant for HF, T2DM, PVD, hx of NSTEMI, CAD, HLD, tobacco abuse, hx of AKI.  Patient had  recent major surgeries including spinal surgery and coronary artery bypass graft (CABG x 4).  Patient presented for lipid clinic. Reports he was on Lipitor before and his PCP had switched it to Crestor last Oct. He tolerates that well. 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 Post bypass he has made some significant changes to his diet so wanted to get updated baseline lab before we make any changes to his lipid medications.  Reviewed options for lowering LDL cholesterol, including ezetimibe, PCSK-9 inhibitors, bempedoic acid and inclisiran.  Discussed mechanisms of action, dosing, side effects and potential decreases in LDL cholesterol.  Also reviewed cost information and potential options for patient assistance.  Current Medications: rosuvastatin 20 mg daily  Intolerances: none  Risk Factors: T2DM, HDL,  LDL goal: <55 mg/dl TG <366 mg dl   Diet: cut down on red meat.Cut down on processed food and cheese. Drinks lots of water   Exercise: limited mobility from chronic back pain. Go to physical therapy   Family History:  Relation Problem Comments  Mother Hypertension, T2Dm, HLD     Social History:  Alcohol: none  Smoking: none  Labs:  Lipid Panel     Component Value Date/Time   CHOL 199 01/09/2023 0841   TRIG 235 (H) 01/09/2023 0841   HDL 36 (L) 01/09/2023 0841   CHOLHDL 5.9 (H) 12/14/2021 1025   CHOLHDL 6.6 09/03/2021 0314   VLDL 50 (H) 09/03/2021 0314   LDLCALC 121 (H) 01/09/2023 0841   LABVLDL 42 (H) 01/09/2023 0841    Past Medical History:  Diagnosis Date   Cardiomyopathy (HCC)    a. EF 45% in 2019.   CHF (congestive heart  failure) (HCC)    Chronic anticoagulation 09/30/2017   Diabetes mellitus type 2 in nonobese [facility])    Discitis of thoracic region 09/25/2022   Does not have health insurance    Essential hypertension 09/26/2017   Financial difficulties    H/O noncompliance with medical treatment, presenting hazards to health    Hardware complicating wound infection (HCC) 09/25/2022   Non-insulin treated type 2 diabetes mellitus (HCC) 09/26/2017   Persistent atrial fibrillation (HCC)    Sleep apnea suspected 09/30/2017   "years ago"- not anymore per patient    Current Outpatient Medications on File Prior to Visit  Medication Sig Dispense Refill   ALPRAZolam (XANAX) 0.5 MG tablet Take 1 tablet (0.5 mg total) by mouth at bedtime as needed for anxiety or sleep. 30 tablet 0   apixaban (ELIQUIS) 5 MG TABS tablet Take 1 tablet (5 mg total) by mouth 2 (two) times daily. 180 tablet 1   carvedilol (COREG) 6.25 MG tablet Take 1 tablet (6.25 mg total) by mouth 2 (two) times daily. 180 tablet 3   Continuous Glucose Sensor (DEXCOM G7 SENSOR) MISC Use as directed & change every 10 days. (Patient not taking: Reported on 06/30/2023) 3 each 12   dapagliflozin propanediol (FARXIGA) 10 MG TABS  tablet Take 1 tablet (10 mg total) by mouth daily before breakfast. 90 tablet 1   doxycycline (VIBRA-TABS) 100 MG tablet Take 1 tablet (100 mg total) by mouth every 12 (twelve) hours. 180 tablet 3   famotidine (PEPCID) 20 MG tablet Take 1 tablet (20 mg total) by mouth 2 times daily. 60 tablet 1   furosemide (LASIX) 40 MG tablet Take 1 tablet (40 mg total) by mouth 2 (two) times daily. (Patient taking differently: Take 40 mg by mouth daily.) 60 tablet 0   HYDROcodone-acetaminophen (NORCO) 10-325 MG tablet Take 1 tablet by mouth every 4 (four) hours as needed for severe pain (pain score 7-10). (Patient taking differently: Take 1 tablet by mouth every 6 (six) hours as needed for severe pain (pain score 7-10).) 42 tablet 0   insulin glargine  (LANTUS SOLOSTAR) 100 UNIT/ML Solostar Pen Inject 27 Units into the skin daily. 30 mL 6   methocarbamol (ROBAXIN) 750 MG tablet Take 1 tablet (750 mg total) by mouth every 8 (eight) hours as needed for muscle spasms.     Misc. 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 Opiates overdose) 2 each 0   polyethylene glycol (MIRALAX / GLYCOLAX) 17 g packet Mix 1 packet (17 g) in beverage and take by mouth 2 (two) times daily. 100 each 3   Potassium Chloride ER 20 MEQ TBCR Take 1 tablet by mouth daily at 12 noon.     pregabalin (LYRICA) 75 MG capsule Take 1 capsule (75 mg total) by mouth 2 (two) times daily. 60 capsule 2   rosuvastatin (CRESTOR) 20 MG tablet Take 1 tablet (20 mg total) by mouth daily. 90 tablet 1   tamsulosin (FLOMAX) 0.4 MG CAPS capsule Take 1 capsule (0.4 mg total) by mouth daily after supper. 90 capsule 1   XTAMPZA ER 9 MG C12A Take 1 capsule by mouth every 12 (twelve) hours.     No current facility-administered medications on file prior to visit.    No Known Allergies  Assessment/Plan:  1. Hyperlipidemia -  Problem  Pure Hypercholesterolemia   Pure hypercholesterolemia Assessment:  LDL goal: < 55 mg/dl TG goal <130 mg/dl last LDLc 121mg /dl TG 865 mg/dl  (78/469) Tolerates Crestor 20 mg daily well  Made significant changes to his diet post bypass   Discussed next potential options (ezetimibe, PCSK-9 inhibitors, bempedoic acid and inclisiran); cost, dosing efficacy, side effects  Follows heart healthy diet and mobility is restricted due to chronic back problem    Plan: Continue taking current medications (Crestor ) Will apply for PA for  PCSK9i; will inform patient upon approval Will get updated baseline today - ordered NMR, ApoB and Lp(a) Lipid lab due in 2-3 months of therapy optimization   Thank you,  Carmela Hurt, Pharm.D [facility] [address], [place], [place] [address]  Phone: [phone]; Fax: [phone]

## 2023-07-06 NOTE — Patient Instructions (Signed)
  Medication changes: continue taking rosuvastatin 20 mg daily  We will start the process to get PCSK9i(Repatha or Praluent ) covered by your insurance.  Once the prior authorization is complete, we will call you to let you know and confirm pharmacy information.     Praluent is a cholesterol medication that improved your body's ability to get rid of "bad cholesterol" known as LDL. It can lower your LDL up to 60%. It is an injection that is given under the skin every 2 weeks. The most common side effects of Praluent include runny nose, symptoms of the common cold, rarely flu or flu-like symptoms, back/muscle pain in about 3-4% of the patients, and redness, pain, or bruising at the injection site.    Repatha is a cholesterol medication that improved your body's ability to get rid of "bad cholesterol" known as LDL. It can lower your LDL up to 60%! It is an injection that is given under the skin every 2 weeks. The medication often requires a prior authorization from your insurance company.  The most common side effects of Repatha include runny nose, symptoms of the common cold, rarely flu or flu-like symptoms, back/muscle pain in about 3-4% of the patients, and redness, pain, or bruising at the injection site.   Lab orders: We want to repeat labs after 2-3 months.  We will send you a lab order to remind you once we get closer to that time.

## 2023-07-06 NOTE — Assessment & Plan Note (Signed)
 Assessment:  LDL goal: < 55 mg/dl TG goal <657 mg/dl last LDLc 121mg /dl TG 846 mg/dl  (96/295) Tolerates Crestor 20 mg daily well  Made significant changes to his diet post bypass   Discussed next potential options (ezetimibe, PCSK-9 inhibitors, bempedoic acid and inclisiran); cost, dosing efficacy, side effects  Follows heart healthy diet and mobility is restricted due to chronic back problem    Plan: Continue taking current medications (Crestor ) Will apply for PA for PCSK9i; will inform patient upon approval Will get updated baseline today - ordered NMR, ApoB and Lp(a) Lipid lab due in 2-3 months of therapy optimization

## 2023-07-11 ENCOUNTER — Telehealth: Payer: Self-pay | Admitting: Pharmacist

## 2023-07-12 ENCOUNTER — Emergency Department (HOSPITAL_COMMUNITY)

## 2023-07-12 ENCOUNTER — Inpatient Hospital Stay (HOSPITAL_COMMUNITY)
Admission: EM | Admit: 2023-07-12 | Discharge: 2023-07-21 | DRG: 522 | Disposition: A | Discharge status: Skilled Nursing Facility | Attending: Internal Medicine | Admitting: Internal Medicine

## 2023-07-12 ENCOUNTER — Telehealth: Payer: Self-pay

## 2023-07-12 ENCOUNTER — Other Ambulatory Visit: Payer: Self-pay

## 2023-07-12 ENCOUNTER — Encounter (HOSPITAL_COMMUNITY): Payer: Self-pay | Admitting: Internal Medicine

## 2023-07-12 ENCOUNTER — Other Ambulatory Visit (HOSPITAL_COMMUNITY): Payer: Self-pay

## 2023-07-12 DIAGNOSIS — Z951 Presence of aortocoronary bypass graft: Secondary | ICD-10-CM | POA: Diagnosis not present

## 2023-07-12 DIAGNOSIS — Z794 Long term (current) use of insulin: Secondary | ICD-10-CM | POA: Diagnosis not present

## 2023-07-12 DIAGNOSIS — E875 Hyperkalemia: Secondary | ICD-10-CM | POA: Diagnosis not present

## 2023-07-12 DIAGNOSIS — I429 Cardiomyopathy, unspecified: Secondary | ICD-10-CM | POA: Diagnosis present

## 2023-07-12 DIAGNOSIS — S7292XA Unspecified fracture of left femur, initial encounter for closed fracture: Secondary | ICD-10-CM

## 2023-07-12 DIAGNOSIS — Y93E1 Activity, personal bathing and showering: Secondary | ICD-10-CM | POA: Diagnosis not present

## 2023-07-12 DIAGNOSIS — Z79899 Other long term (current) drug therapy: Secondary | ICD-10-CM

## 2023-07-12 DIAGNOSIS — N179 Acute kidney failure, unspecified: Secondary | ICD-10-CM | POA: Diagnosis not present

## 2023-07-12 DIAGNOSIS — Z87891 Personal history of nicotine dependence: Secondary | ICD-10-CM

## 2023-07-12 DIAGNOSIS — Y92002 Bathroom of unspecified non-institutional (private) residence single-family (private) house as the place of occurrence of the external cause: Secondary | ICD-10-CM

## 2023-07-12 DIAGNOSIS — W182XXA Fall in (into) shower or empty bathtub, initial encounter: Secondary | ICD-10-CM | POA: Diagnosis present

## 2023-07-12 DIAGNOSIS — E119 Type 2 diabetes mellitus without complications: Secondary | ICD-10-CM

## 2023-07-12 DIAGNOSIS — D62 Acute posthemorrhagic anemia: Secondary | ICD-10-CM | POA: Diagnosis not present

## 2023-07-12 DIAGNOSIS — W19XXXA Unspecified fall, initial encounter: Principal | ICD-10-CM

## 2023-07-12 DIAGNOSIS — G8929 Other chronic pain: Secondary | ICD-10-CM | POA: Diagnosis present

## 2023-07-12 DIAGNOSIS — G629 Polyneuropathy, unspecified: Secondary | ICD-10-CM

## 2023-07-12 DIAGNOSIS — N1832 Chronic kidney disease, stage 3b: Secondary | ICD-10-CM | POA: Diagnosis present

## 2023-07-12 DIAGNOSIS — E1122 Type 2 diabetes mellitus with diabetic chronic kidney disease: Secondary | ICD-10-CM | POA: Diagnosis present

## 2023-07-12 DIAGNOSIS — S72092A Other fracture of head and neck of left femur, initial encounter for closed fracture: Principal | ICD-10-CM | POA: Diagnosis present

## 2023-07-12 DIAGNOSIS — Z01810 Encounter for preprocedural cardiovascular examination: Secondary | ICD-10-CM | POA: Diagnosis not present

## 2023-07-12 DIAGNOSIS — E7849 Other hyperlipidemia: Secondary | ICD-10-CM | POA: Diagnosis not present

## 2023-07-12 DIAGNOSIS — E1142 Type 2 diabetes mellitus with diabetic polyneuropathy: Secondary | ICD-10-CM | POA: Diagnosis present

## 2023-07-12 DIAGNOSIS — I441 Atrioventricular block, second degree: Secondary | ICD-10-CM | POA: Diagnosis present

## 2023-07-12 DIAGNOSIS — N400 Enlarged prostate without lower urinary tract symptoms: Secondary | ICD-10-CM | POA: Diagnosis not present

## 2023-07-12 DIAGNOSIS — S72002A Fracture of unspecified part of neck of left femur, initial encounter for closed fracture: Secondary | ICD-10-CM | POA: Diagnosis not present

## 2023-07-12 DIAGNOSIS — I2510 Atherosclerotic heart disease of native coronary artery without angina pectoris: Secondary | ICD-10-CM | POA: Diagnosis not present

## 2023-07-12 DIAGNOSIS — Z5986 Financial insecurity: Secondary | ICD-10-CM

## 2023-07-12 DIAGNOSIS — I5022 Chronic systolic (congestive) heart failure: Secondary | ICD-10-CM | POA: Diagnosis present

## 2023-07-12 DIAGNOSIS — M25552 Pain in left hip: Secondary | ICD-10-CM | POA: Diagnosis present

## 2023-07-12 DIAGNOSIS — E1165 Type 2 diabetes mellitus with hyperglycemia: Secondary | ICD-10-CM | POA: Diagnosis present

## 2023-07-12 DIAGNOSIS — Z8249 Family history of ischemic heart disease and other diseases of the circulatory system: Secondary | ICD-10-CM | POA: Diagnosis not present

## 2023-07-12 DIAGNOSIS — G4733 Obstructive sleep apnea (adult) (pediatric): Secondary | ICD-10-CM | POA: Diagnosis not present

## 2023-07-12 DIAGNOSIS — Z7901 Long term (current) use of anticoagulants: Secondary | ICD-10-CM | POA: Diagnosis not present

## 2023-07-12 DIAGNOSIS — I4821 Permanent atrial fibrillation: Secondary | ICD-10-CM | POA: Diagnosis present

## 2023-07-12 DIAGNOSIS — I10 Essential (primary) hypertension: Secondary | ICD-10-CM | POA: Diagnosis not present

## 2023-07-12 DIAGNOSIS — I4892 Unspecified atrial flutter: Secondary | ICD-10-CM | POA: Diagnosis present

## 2023-07-12 DIAGNOSIS — M4644 Discitis, unspecified, thoracic region: Secondary | ICD-10-CM

## 2023-07-12 DIAGNOSIS — E785 Hyperlipidemia, unspecified: Secondary | ICD-10-CM | POA: Diagnosis present

## 2023-07-12 DIAGNOSIS — G588 Other specified mononeuropathies: Secondary | ICD-10-CM | POA: Diagnosis not present

## 2023-07-12 DIAGNOSIS — I130 Hypertensive heart and chronic kidney disease with heart failure and stage 1 through stage 4 chronic kidney disease, or unspecified chronic kidney disease: Secondary | ICD-10-CM | POA: Diagnosis present

## 2023-07-12 DIAGNOSIS — I25810 Atherosclerosis of coronary artery bypass graft(s) without angina pectoris: Secondary | ICD-10-CM | POA: Diagnosis present

## 2023-07-12 DIAGNOSIS — N401 Enlarged prostate with lower urinary tract symptoms: Secondary | ICD-10-CM | POA: Diagnosis not present

## 2023-07-12 DIAGNOSIS — Z8614 Personal history of Methicillin resistant Staphylococcus aureus infection: Secondary | ICD-10-CM

## 2023-07-12 DIAGNOSIS — Z8679 Personal history of other diseases of the circulatory system: Secondary | ICD-10-CM

## 2023-07-12 DIAGNOSIS — F1120 Opioid dependence, uncomplicated: Secondary | ICD-10-CM | POA: Diagnosis present

## 2023-07-12 DIAGNOSIS — Z981 Arthrodesis status: Secondary | ICD-10-CM

## 2023-07-12 DIAGNOSIS — R338 Other retention of urine: Secondary | ICD-10-CM | POA: Diagnosis not present

## 2023-07-12 DIAGNOSIS — Z5982 Transportation insecurity: Secondary | ICD-10-CM

## 2023-07-12 DIAGNOSIS — N183 Chronic kidney disease, stage 3 unspecified: Secondary | ICD-10-CM | POA: Insufficient documentation

## 2023-07-12 DIAGNOSIS — I252 Old myocardial infarction: Secondary | ICD-10-CM

## 2023-07-12 DIAGNOSIS — I4819 Other persistent atrial fibrillation: Secondary | ICD-10-CM | POA: Diagnosis not present

## 2023-07-12 DIAGNOSIS — Z7984 Long term (current) use of oral hypoglycemic drugs: Secondary | ICD-10-CM

## 2023-07-12 LAB — CBC WITH DIFFERENTIAL/PLATELET
Abs Immature Granulocytes: 0.1 10*3/uL — ABNORMAL HIGH (ref 0.00–0.07)
Basophils Absolute: 0.1 10*3/uL (ref 0.0–0.1)
Basophils Relative: 1 %
Eosinophils Absolute: 0.2 10*3/uL (ref 0.0–0.5)
Eosinophils Relative: 2 %
HCT: 41.4 % (ref 39.0–52.0)
Hemoglobin: 13.6 g/dL (ref 13.0–17.0)
Immature Granulocytes: 1 %
Lymphocytes Relative: 11 %
Lymphs Abs: 1 10*3/uL (ref 0.7–4.0)
MCH: 30.1 pg (ref 26.0–34.0)
MCHC: 32.9 g/dL (ref 30.0–36.0)
MCV: 91.6 fL (ref 80.0–100.0)
Monocytes Absolute: 0.8 10*3/uL (ref 0.1–1.0)
Monocytes Relative: 9 %
Neutro Abs: 6.9 10*3/uL (ref 1.7–7.7)
Neutrophils Relative %: 76 %
Platelets: 215 10*3/uL (ref 150–400)
RBC: 4.52 MIL/uL (ref 4.22–5.81)
RDW: 13.4 % (ref 11.5–15.5)
WBC: 9 10*3/uL (ref 4.0–10.5)
nRBC: 0 % (ref 0.0–0.2)

## 2023-07-12 LAB — BASIC METABOLIC PANEL WITH GFR
Anion gap: 12 (ref 5–15)
BUN: 34 mg/dL — ABNORMAL HIGH (ref 8–23)
CO2: 23 mmol/L (ref 22–32)
Calcium: 9.6 mg/dL (ref 8.9–10.3)
Chloride: 102 mmol/L (ref 98–111)
Creatinine, Ser: 1.72 mg/dL — ABNORMAL HIGH (ref 0.61–1.24)
GFR, Estimated: 45 mL/min — ABNORMAL LOW (ref >60)
Glucose, Bld: 158 mg/dL — ABNORMAL HIGH (ref 70–99)
Potassium: 4.4 mmol/L (ref 3.5–5.1)
Sodium: 137 mmol/L (ref 135–145)

## 2023-07-12 MED ORDER — TAMSULOSIN HCL 0.4 MG PO CAPS
0.4000 mg | ORAL_CAPSULE | Freq: Every day | ORAL | Status: DC
  Administered 2023-07-13 – 2023-07-20 (×8): 0.4 mg via ORAL
  Filled 2023-07-12 (×8): qty 1

## 2023-07-12 MED ORDER — HYDRALAZINE HCL 20 MG/ML IJ SOLN
10.0000 mg | Freq: Four times a day (QID) | INTRAMUSCULAR | Status: DC | PRN
  Filled 2023-07-12: qty 1

## 2023-07-12 MED ORDER — ONDANSETRON HCL 4 MG/2ML IJ SOLN
4.0000 mg | Freq: Four times a day (QID) | INTRAMUSCULAR | Status: DC | PRN
  Administered 2023-07-13 – 2023-07-21 (×4): 4 mg via INTRAVENOUS
  Filled 2023-07-12 (×4): qty 2

## 2023-07-12 MED ORDER — HYDROMORPHONE HCL 1 MG/ML IJ SOLN: 0.5000 mg | INTRAMUSCULAR | Status: DC | PRN

## 2023-07-12 MED ORDER — INSULIN ASPART 100 UNIT/ML IJ SOLN
0.0000 [IU] | Freq: Three times a day (TID) | INTRAMUSCULAR | Status: DC
  Administered 2023-07-13: 5 [IU] via SUBCUTANEOUS

## 2023-07-12 MED ORDER — FAMOTIDINE 20 MG PO TABS
20.0000 mg | ORAL_TABLET | Freq: Two times a day (BID) | ORAL | Status: DC
  Administered 2023-07-13 – 2023-07-15 (×6): 20 mg via ORAL
  Filled 2023-07-12 (×6): qty 1

## 2023-07-12 MED ORDER — CARVEDILOL 3.125 MG PO TABS: 6.2500 mg | ORAL_TABLET | Freq: Two times a day (BID) | ORAL | Status: DC

## 2023-07-12 MED ORDER — INSULIN GLARGINE-YFGN 100 UNIT/ML ~~LOC~~ SOLN
27.0000 [IU] | Freq: Every day | SUBCUTANEOUS | Status: DC
  Filled 2023-07-12 (×2): qty 0.27

## 2023-07-12 MED ORDER — POLYETHYLENE GLYCOL 3350 17 G PO PACK
17.0000 g | PACK | Freq: Two times a day (BID) | ORAL | Status: DC
  Administered 2023-07-14 – 2023-07-21 (×10): 17 g via ORAL
  Filled 2023-07-12 (×15): qty 1

## 2023-07-12 MED ORDER — SENNOSIDES-DOCUSATE SODIUM 8.6-50 MG PO TABS: 1.0000 | ORAL_TABLET | Freq: Every evening | ORAL | Status: DC | PRN

## 2023-07-12 MED ORDER — SODIUM CHLORIDE 0.9% FLUSH
3.0000 mL | Freq: Two times a day (BID) | INTRAVENOUS | Status: DC
  Administered 2023-07-13 – 2023-07-15 (×7): 3 mL via INTRAVENOUS

## 2023-07-12 MED ORDER — METHOCARBAMOL 500 MG PO TABS
750.0000 mg | ORAL_TABLET | Freq: Three times a day (TID) | ORAL | Status: DC | PRN
  Administered 2023-07-13 – 2023-07-21 (×9): 750 mg via ORAL
  Filled 2023-07-12 (×10): qty 2

## 2023-07-12 MED ORDER — SODIUM CHLORIDE 0.9% FLUSH: 3.0000 mL | INTRAVENOUS | Status: DC | PRN

## 2023-07-12 MED ORDER — INSULIN ASPART 100 UNIT/ML IJ SOLN
0.0000 [IU] | Freq: Every day | INTRAMUSCULAR | Status: DC
  Administered 2023-07-13: 5 [IU] via SUBCUTANEOUS

## 2023-07-12 MED ORDER — SODIUM CHLORIDE 0.9 % IV SOLN: 250.0000 mL | INTRAVENOUS | Status: AC | PRN

## 2023-07-12 MED ORDER — ACETAMINOPHEN 325 MG PO TABS
650.0000 mg | ORAL_TABLET | Freq: Four times a day (QID) | ORAL | Status: DC | PRN
  Administered 2023-07-14: 650 mg via ORAL
  Filled 2023-07-12 (×2): qty 2

## 2023-07-12 MED ORDER — FENTANYL CITRATE PF 50 MCG/ML IJ SOSY
100.0000 ug | PREFILLED_SYRINGE | INTRAMUSCULAR | Status: DC | PRN
  Administered 2023-07-12: 100 ug via INTRAVENOUS
  Filled 2023-07-12: qty 2

## 2023-07-12 MED ORDER — HYDROMORPHONE HCL 1 MG/ML IJ SOLN
0.5000 mg | INTRAMUSCULAR | Status: DC | PRN
  Administered 2023-07-12: 0.5 mg via INTRAVENOUS
  Filled 2023-07-12: qty 1

## 2023-07-12 MED ORDER — HYDROCODONE-ACETAMINOPHEN 5-325 MG PO TABS: 1.0000 | ORAL_TABLET | ORAL | Status: DC | PRN

## 2023-07-12 MED ORDER — FENTANYL CITRATE PF 50 MCG/ML IJ SOSY
50.0000 ug | PREFILLED_SYRINGE | INTRAMUSCULAR | Status: DC | PRN
  Administered 2023-07-12: 50 ug via INTRAVENOUS
  Filled 2023-07-12: qty 1

## 2023-07-12 MED ORDER — FUROSEMIDE 20 MG PO TABS: 40.0000 mg | ORAL_TABLET | Freq: Every day | ORAL | Status: DC

## 2023-07-12 MED ORDER — FENTANYL CITRATE PF 50 MCG/ML IJ SOSY
12.5000 ug | PREFILLED_SYRINGE | INTRAMUSCULAR | Status: DC | PRN
  Administered 2023-07-13 (×7): 25 ug via INTRAVENOUS
  Filled 2023-07-12 (×7): qty 1

## 2023-07-12 MED ORDER — ONDANSETRON HCL 4 MG PO TABS: 4.0000 mg | ORAL_TABLET | Freq: Four times a day (QID) | ORAL | Status: DC | PRN

## 2023-07-12 MED ORDER — ACETAMINOPHEN 650 MG RE SUPP: 650.0000 mg | Freq: Four times a day (QID) | RECTAL | Status: DC | PRN

## 2023-07-12 MED ORDER — ROSUVASTATIN CALCIUM 20 MG PO TABS
20.0000 mg | ORAL_TABLET | Freq: Every day | ORAL | Status: DC
  Administered 2023-07-13 – 2023-07-21 (×9): 20 mg via ORAL
  Filled 2023-07-12 (×9): qty 1

## 2023-07-12 MED ORDER — ASPIRIN 81 MG PO TBEC
81.0000 mg | DELAYED_RELEASE_TABLET | Freq: Every day | ORAL | Status: DC
  Administered 2023-07-13 – 2023-07-17 (×6): 81 mg via ORAL
  Filled 2023-07-12 (×6): qty 1

## 2023-07-12 MED ORDER — PREGABALIN 75 MG PO CAPS
75.0000 mg | ORAL_CAPSULE | Freq: Two times a day (BID) | ORAL | Status: DC
  Administered 2023-07-13 – 2023-07-21 (×18): 75 mg via ORAL
  Filled 2023-07-12: qty 3
  Filled 2023-07-12 (×8): qty 1
  Filled 2023-07-12: qty 3
  Filled 2023-07-12 (×8): qty 1

## 2023-07-12 MED ORDER — DAPAGLIFLOZIN PROPANEDIOL 10 MG PO TABS
10.0000 mg | ORAL_TABLET | Freq: Every day | ORAL | Status: DC
  Administered 2023-07-13 – 2023-07-21 (×9): 10 mg via ORAL
  Filled 2023-07-12 (×9): qty 1

## 2023-07-12 NOTE — Telephone Encounter (Signed)
 Pharmacy Patient Advocate Encounter  Received notification from [facility] that Prior Authorization for REPATHA has been APPROVED from 07/12/23 to 07/11/24. Ran test claim, Copay is $4. This test claim was processed through [facility]- copay amounts may vary at other pharmacies due to pharmacy/plan contracts, or as the patient moves through the different stages of their insurance plan.

## 2023-07-12 NOTE — Telephone Encounter (Signed)
 Pharmacy Patient Advocate Encounter   Received notification from Physician's Office that prior authorization for REPATHA is required/requested.   Insurance verification completed.   The patient is insured through [facility] .   Per test claim: PA required; PA submitted to above mentioned insurance via CoverMyMeds Key/confirmation #/EOC [id number] Status is pending

## 2023-07-12 NOTE — ED Triage Notes (Signed)
 Pt BIB [facility] from home. Pt was getting out of the shower, foot slipped causing pt to land on the L hip on the side of the tub. Pt unable to stand or ambulate on L leg.   Fentanyl 4mg  Morphine

## 2023-07-12 NOTE — Telephone Encounter (Signed)
 PA request has been Submitted. New Encounter has been or will be created for follow up. For additional info see Pharmacy Prior Auth telephone encounter from 07/12/23.

## 2023-07-12 NOTE — Progress Notes (Signed)
 Case dw Dr Doran Durand in ER.  Left NOF hip fracture will need treatment with THA.  However, given recent CABG and current Eliquis, will need clearance from cards and medical optimization.  Full ortho consult to follow.  Hopeful for surgery Friday.

## 2023-07-12 NOTE — ED Provider Notes (Signed)
  EMERGENCY DEPARTMENT AT [facility] Provider Note   CSN: [id number] Arrival date & time: 07/12/23  2026     History Chief Complaint  Patient presents with   Fall    HPI David [name] is a 63 y.o. male presenting for GLF in the shower. Direct blow to left hip. Recent cardiac bypass.  Described a mechanical fall.  Patient's recorded medical, surgical, social, medication list and allergies were reviewed in the Snapshot window as part of the initial history.   Review of Systems   Review of Systems  Constitutional:  Negative for chills and fever.  HENT:  Negative for ear pain and sore throat.   Eyes:  Negative for pain and visual disturbance.  Respiratory:  Negative for cough and shortness of breath.   Cardiovascular:  Negative for chest pain and palpitations.  Gastrointestinal:  Negative for abdominal pain and vomiting.  Genitourinary:  Negative for dysuria and hematuria.  Musculoskeletal:  Negative for arthralgias and back pain.  Skin:  Negative for color change and rash.  Neurological:  Negative for seizures and syncope.  All other systems reviewed and are negative.   Physical Exam Updated Vital Signs BP (!) 187/106 (BP Location: Left Arm)   Pulse 87   Temp 98.3 F (36.8 C) (Oral)   Resp 20   Ht 6' (1.829 m)   Wt 81.6 kg   SpO2 98%   BMI 24.41 kg/m  Physical Exam Vitals and nursing note reviewed.  Constitutional:      General: He is not in acute distress.    Appearance: He is well-developed.  HENT:     Head: Normocephalic and atraumatic.  Eyes:     Conjunctiva/sclera: Conjunctivae normal.  Cardiovascular:     Rate and Rhythm: Normal rate and regular rhythm.     Heart sounds: No murmur heard. Pulmonary:     Effort: Pulmonary effort is normal. No respiratory distress.     Breath sounds: Normal breath sounds.  Abdominal:     Palpations: Abdomen is soft.     Tenderness: There is no abdominal tenderness.  Musculoskeletal:        General:  Tenderness, deformity and signs of injury present. No swelling.     Cervical back: Neck supple.  Skin:    General: Skin is warm and dry.     Capillary Refill: Capillary refill takes less than 2 seconds.  Neurological:     Mental Status: He is alert.  Psychiatric:        Mood and Affect: Mood normal.      ED Course/ Medical Decision Making/ A&P    Procedures Procedures   Medications Ordered in ED Medications  HYDROmorphone (DILAUDID) injection 0.5 mg (0.5 mg Intravenous Given 07/12/23 2312)  carvedilol (COREG) tablet 6.25 mg (has no administration in time range)  furosemide (LASIX) tablet 40 mg (has no administration in time range)  rosuvastatin (CRESTOR) tablet 20 mg (has no administration in time range)  dapagliflozin propanediol (FARXIGA) tablet 10 mg (has no administration in time range)  insulin glargine-yfgn (SEMGLEE) injection 27 Units (has no administration in time range)  famotidine (PEPCID) tablet 20 mg (has no administration in time range)  polyethylene glycol (MIRALAX / GLYCOLAX) packet 17 g (has no administration in time range)  tamsulosin (FLOMAX) capsule 0.4 mg (has no administration in time range)  methocarbamol (ROBAXIN) tablet 750 mg (has no administration in time range)  pregabalin (LYRICA) capsule 75 mg (has no administration in time range)  aspirin EC  tablet 81 mg (has no administration in time range)  sodium chloride flush (NS) 0.9 % injection 3 mL (has no administration in time range)  sodium chloride flush (NS) 0.9 % injection 3 mL (has no administration in time range)  0.9 %  sodium chloride infusion (has no administration in time range)  acetaminophen (TYLENOL) tablet 650 mg (has no administration in time range)    Or  acetaminophen (TYLENOL) suppository 650 mg (has no administration in time range)  HYDROcodone-acetaminophen (NORCO/VICODIN) 5-325 MG per tablet 1-2 tablet (has no administration in time range)  ondansetron (ZOFRAN) tablet 4 mg (has no  administration in time range)    Or  ondansetron (ZOFRAN) injection 4 mg (has no administration in time range)  senna-docusate (Senokot-S) tablet 1 tablet (has no administration in time range)  insulin aspart (novoLOG) injection 0-6 Units (has no administration in time range)  insulin aspart (novoLOG) injection 0-5 Units (has no administration in time range)  hydrALAZINE (APRESOLINE) injection 10 mg (has no administration in time range)   Medical Decision Making:    David [name] is a 63 y.o. male who presented to the ED today with a moderate mechanisma trauma, detailed above.    Patient placed on continuous vitals and telemetry monitoring while in ED which was reviewed periodically.   Given this mechanism of trauma, a full physical exam was performed. Notably, patient was HDS in NAD.   Reviewed and confirmed nursing documentation for past medical history, family history, social history.    Initial Assessment/Plan:   This is a patient presenting with a moderate mechanism trauma.  As such, I have considered intracranial injuries including intracranial hemorrhage, intrathoracic injuries including blunt myocardial or blunt lung injury, blunt abdominal injuries including aortic dissection, bladder injury, spleen injury, liver injury and I have considered orthopedic injuries including extremity or spinal injury.  With the patient's presentation of moderate mechanism trauma but an otherwise reassuring exam, patient warrants targeted evaluation for potential traumatic injuries. Will proceed with targeted evaluation for potential injuries. Will proceed with CTH and left hips XR and CXR. Objective evaluation resulted with Hip fracture.   Final Reassessment and Plan:   Orthopedics stated they would operate on Friday.  He needs an Eliquis washout.  I consulted cardiology who recommended no medication to bridge but did state the patient should be on an antiplatelet including aspirin.  Appears that this  was prescribed at his last visit.  Disposition:   Based on the above findings, I believe this patient is stable for admission.    Patient/family educated about specific findings on our evaluation and explained exact reasons for admission.  Patient/family educated about clinical situation and time was allowed to answer questions.   Admission team communicated with and agreed with need for admission. Patient admitted. Patient  ready to move at this time.     Emergency Department Medication Summary:   Medications  HYDROmorphone (DILAUDID) injection 0.5 mg (0.5 mg Intravenous Given 07/12/23 2312)  carvedilol (COREG) tablet 6.25 mg (has no administration in time range)  furosemide (LASIX) tablet 40 mg (has no administration in time range)  rosuvastatin (CRESTOR) tablet 20 mg (has no administration in time range)  dapagliflozin propanediol (FARXIGA) tablet 10 mg (has no administration in time range)  insulin glargine-yfgn (SEMGLEE) injection 27 Units (has no administration in time range)  famotidine (PEPCID) tablet 20 mg (has no administration in time range)  polyethylene glycol (MIRALAX / GLYCOLAX) packet 17 g (has no administration in time range)  tamsulosin (  FLOMAX) capsule 0.4 mg (has no administration in time range)  methocarbamol (ROBAXIN) tablet 750 mg (has no administration in time range)  pregabalin (LYRICA) capsule 75 mg (has no administration in time range)  aspirin EC tablet 81 mg (has no administration in time range)  sodium chloride flush (NS) 0.9 % injection 3 mL (has no administration in time range)  sodium chloride flush (NS) 0.9 % injection 3 mL (has no administration in time range)  0.9 %  sodium chloride infusion (has no administration in time range)  acetaminophen (TYLENOL) tablet 650 mg (has no administration in time range)    Or  acetaminophen (TYLENOL) suppository 650 mg (has no administration in time range)  HYDROcodone-acetaminophen (NORCO/VICODIN) 5-325 MG per tablet  1-2 tablet (has no administration in time range)  ondansetron (ZOFRAN) tablet 4 mg (has no administration in time range)    Or  ondansetron (ZOFRAN) injection 4 mg (has no administration in time range)  senna-docusate (Senokot-S) tablet 1 tablet (has no administration in time range)  insulin aspart (novoLOG) injection 0-6 Units (has no administration in time range)  insulin aspart (novoLOG) injection 0-5 Units (has no administration in time range)  hydrALAZINE (APRESOLINE) injection 10 mg (has no administration in time range)          Clinical Impression:  1. Fall, initial encounter      Admit   Final Clinical Impression(s) / ED Diagnoses Final diagnoses:  Fall, initial encounter    Rx / DC Orders ED Discharge Orders     None         Glyn Ade, MD 07/12/23 2321

## 2023-07-12 NOTE — H&P (Incomplete)
 History and Physical    David [name]:[id number] DOB: [birth year] DOA: 07/12/2023  PCP: [name], MD   Patient coming from: Home   Chief Complaint:  Chief Complaint  Patient presents with   Fall   ED TRIAGE note:Pt BIB [facility] from home. Pt was getting out of the shower, foot slipped causing pt to land on the L hip on the side of the tub. Pt unable to stand or ambulate on L leg.    Fentanyl 4mg  Morphine         HPI:  BIRD SWETZ is a 63 y.o. male with medical history significant of chronic atrial fibrillation, heart failure with reduced ejection fraction, essential hypertension, CABG in 04/2023, CKD stage IIIb, non-insulin-dependent DM type II, and hyperlipidemia patient also had recent spinal surgery and with nerve damage causing temporary left-sided leg paralysis currently working with physical therapy. Patient has been presenting to the emergency department as he was getting out of the shower foot slipped causing patient landed on the left-sided hip inside his staff.  Since then patient is unable to stand and ambulate with the left leg.  Patient denies any loss of consciousness, headache, blurry vision, chest pain, palpitation, nausea, vomiting and diaphoresis.  Complaining of significant left-sided hip joint pain 9/10.   ED Course:  At presentation to ED patient blood pressure is elevated 187/106 otherwise hemodynamically stable. BMP showing BUN 34, creatinine 1.7 and GFR 45. CBC unremarkable.  EKG showing atrial fibrillation heart rate 100.  Pending CT head, chest x-ray and x-ray of the left hip.  In the ED patient has been consulted and evaluated by orthopedic surgeon Dr. Fredrik Cove for left-sided hip fracture.  However per orthopedics given patient has recent CABG and currently on Eliquis recommended cardiology clearance and medical optimization and full consult to follow.  Tentative plan for surgical repair on Friday.  Apparently patient has been not taking  aspirin however he has been taking Eliquis.  Dr. Marca Ancona.  Spoke with on-call cardiology Dr. Lendell Caprice.  Per cardiology there is no need to bridge with heparin with Eliquis given CABG has been done 3 months ago recommended to continue the aspirin and hold Eliquis until surgery.  Hospitalist has been consulted for further evaluation and management of left-sided femoral neck fracture.   Significant labs in the ED: Lab Orders         CBC with Differential         Basic metabolic panel         CBC         Protime-INR         APTT         Comprehensive metabolic panel         Magnesium         CBG monitoring, ED       Review of Systems:  Review of Systems  Constitutional:  Negative for chills and fever.  Eyes:  Negative for blurred vision.  Respiratory:  Negative for cough, sputum production and shortness of breath.   Cardiovascular:  Negative for chest pain and palpitations.  Gastrointestinal:  Negative for abdominal pain, heartburn, nausea and vomiting.  Musculoskeletal:  Positive for falls and joint pain. Negative for back pain, myalgias and neck pain.  Neurological:  Negative for dizziness and headaches.  Psychiatric/Behavioral:  The patient is not nervous/anxious.   All other systems reviewed and are negative.   Past Medical History:  Diagnosis Date   Cardiomyopathy (HCC)    a.  EF 45% in 2019.   CHF (congestive heart failure) (HCC)    Chronic anticoagulation 09/30/2017   Diabetes mellitus type 2 in nonobese [facility])    Discitis of thoracic region 09/25/2022   Does not have health insurance    Essential hypertension 09/26/2017   Financial difficulties    H/O noncompliance with medical treatment, presenting hazards to health    Hardware complicating wound infection (HCC) 09/25/2022   Non-insulin treated type 2 diabetes mellitus (HCC) 09/26/2017   Persistent atrial fibrillation (HCC)    Sleep apnea suspected 09/30/2017   "years ago"- not anymore per patient    Past Surgical  History:  Procedure Laterality Date   ABDOMINAL AORTOGRAM W/LOWER EXTREMITY N/A 09/02/2021   Procedure: ABDOMINAL AORTOGRAM W/LOWER EXTREMITY;  Surgeon: Nada Libman, MD;  [facility];  Service: Cardiovascular;  Laterality: N/A;   ABDOMINAL AORTOGRAM W/LOWER EXTREMITY N/A 12/28/2021   Procedure: ABDOMINAL AORTOGRAM W/LOWER EXTREMITY;  Surgeon: Nada Libman, MD;  [facility];  Service: Cardiovascular;  Laterality: N/A;   ABDOMINAL AORTOGRAM W/LOWER EXTREMITY N/A 07/28/2022   Procedure: ABDOMINAL AORTOGRAM W/LOWER EXTREMITY;  Surgeon: Cephus Shelling, MD;  [facility];  Service: Cardiovascular;  Laterality: N/A;   AMPUTATION Right 09/04/2021   Procedure: 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;  Surgeon: Nadara Mustard, MD;  Location: [facility] OR;  Service: Orthopedics;  Laterality: Right;   AMPUTATION Right 08/02/2022   Procedure: AMPUTATION RAY 5TH METATARSAL OF RIGHT FOOT;  Surgeon: [name], MD;  Location: [facility];  Service: Orthopedics;  Laterality: Right;   APPENDECTOMY  1971   BONE BIOPSY Right 08/02/2022   Procedure: BONE BIOPSY OF 5TH METATARSAL RIGHT FOOT;  Surgeon: [name], MD;  [facility];  Service: Orthopedics;  Laterality: Right;   CARDIOVERSION N/A 09/28/2017   Procedure: CARDIOVERSION;  Surgeon: Thurmon Fair, MD;  [facility];  Service: Cardiovascular;  Laterality: N/A;   CLIPPING OF ATRIAL APPENDAGE N/A 04/18/2023   Procedure: CLIPPING OF ATRIAL APPENDAGE USING ATRICURE ATRICLIP [id number] with pulmonary vein isolation ablation;  Surgeon: Alleen Borne, MD;  [facility];  Service: Open Heart Surgery;  Laterality: N/A;   CORONARY ARTERY BYPASS GRAFT N/A 04/18/2023   Procedure: CORONARY ARTERY BYPASS GRAFTING (CABG) TIMES FOUR USING LEFT INTERNAL MAMMARY ARTERY AND ENDOSCOPICALLY HARVESTED LEFT GREATER SAPHENOUS VEIN;  Surgeon: Alleen Borne, MD;  [facility];  Service: Open Heart Surgery;   Laterality: N/A;   IRRIGATION AND DEBRIDEMENT FOOT  08/02/2022   Procedure: IRRIGATION AND DEBRIDEMENT RIGHT 5TH METATARSAL FOOT;  Surgeon: [name], MD;  [facility];  Service: Orthopedics;;   LAMINECTOMY WITH POSTERIOR LATERAL ARTHRODESIS LEVEL 4 N/A 08/30/2022   Procedure: THORACIC FIVE-THORACIC ELEVEN FUSION, THORACIC EIGHT TRANSPEDICULAR DECOMPRESSION AND PARTIAL CORPECTOMY with O-Arm;  Surgeon: Bedelia Person, MD;  [facility] OR;  Service: Neurosurgery;  Laterality: N/A;   LEFT HEART CATH AND CORONARY ANGIOGRAPHY N/A 04/12/2023   Procedure: LEFT HEART CATH AND CORONARY ANGIOGRAPHY;  Surgeon: Kathleene Hazel, MD;  [facility];  Service: Cardiovascular;  Laterality: N/A;   NASAL ENDOSCOPY Right 06/30/2023   Procedure: ENDOSCOPY, NOSE;  Surgeon: Serena Colonel, MD;  Location: [facility] OR;  Service: ENT;  Laterality: Right;   PERIPHERAL VASCULAR BALLOON ANGIOPLASTY  09/02/2021   Procedure: PERIPHERAL VASCULAR BALLOON ANGIOPLASTY;  Surgeon: Nada Libman, MD;  [facility];  Service: Cardiovascular;;   PERIPHERAL VASCULAR BALLOON ANGIOPLASTY  12/28/2021   Procedure:  PERIPHERAL VASCULAR BALLOON ANGIOPLASTY;  Surgeon: Nada Libman, MD;  [facility];  Service: Cardiovascular;;  Posterior Tibial PTA only   POLYPECTOMY Right 06/30/2023   Procedure: POLYPECTOMY, NASAL CAVITY;  Surgeon: Serena Colonel, MD;  Location: [facility] OR;  Service: ENT;  Laterality: Right;   TEE WITHOUT CARDIOVERSION N/A 09/28/2017   Procedure: TRANSESOPHAGEAL ECHOCARDIOGRAM (TEE);  Surgeon: Thurmon Fair, MD;  Location: [facility] ENDOSCOPY;  Service: Cardiovascular;  Laterality: N/A;   TEE WITHOUT CARDIOVERSION N/A 09/03/2021   Procedure: TRANSESOPHAGEAL ECHOCARDIOGRAM (TEE);  Surgeon: Chilton Si, MD;  Location: [facility] ENDOSCOPY;  Service: Cardiovascular;  Laterality: N/A;   TEE WITHOUT CARDIOVERSION N/A 07/15/2022   Procedure: TRANSESOPHAGEAL ECHOCARDIOGRAM;   Surgeon: Thurmon Fair, MD;  [facility];  Service: Cardiovascular;  Laterality: N/A;   TEE WITHOUT CARDIOVERSION N/A 04/18/2023   Procedure: TRANSESOPHAGEAL ECHOCARDIOGRAM (TEE);  Surgeon: Alleen Borne, MD;  Location: [facility] OR;  Service: Open Heart Surgery;  Laterality: N/A;     reports that he quit smoking about 5 years ago. His smoking use included cigarettes. He started smoking about 20 years ago. He has never used smokeless tobacco. He reports that he does not currently use alcohol after a past usage of about 3.0 standard drinks of alcohol per week. He reports that he does not use drugs.  No Known Allergies  Family History  Problem Relation Age of Onset   Hypertension Mother     Prior to Admission medications   Medication Sig Start Date End Date Taking? Authorizing Provider  ALPRAZolam Prudy Feeler) 0.5 MG tablet Take 1 tablet (0.5 mg total) by mouth at bedtime as needed for anxiety or sleep. 05/02/23   Leary Roca, PA-C  apixaban (ELIQUIS) 5 MG TABS tablet Take 1 tablet (5 mg total) by mouth 2 (two) times daily. 05/15/23   Nahser, Deloris Ping, MD  carvedilol (COREG) 6.25 MG tablet Take 1 tablet (6.25 mg total) by mouth 2 (two) times daily. 06/16/23   Sharlene Dory, PA-C  Continuous Glucose Sensor (DEXCOM G7 SENSOR) MISC Use as directed & change every 10 days. Patient not taking: Reported on 06/30/2023 03/10/23   [name], MD  dapagliflozin propanediol (FARXIGA) 10 MG TABS tablet Take 1 tablet (10 mg total) by mouth daily before breakfast. 03/06/23   [name], MD  doxycycline (VIBRA-TABS) 100 MG tablet Take 1 tablet (100 mg total) by mouth every 12 (twelve) hours. 03/20/23   Danelle Earthly, MD  famotidine (PEPCID) 20 MG tablet Take 1 tablet (20 mg total) by mouth 2 times daily. 06/22/23   Anders Simmonds, PA-C  furosemide (LASIX) 40 MG tablet Take 1 tablet (40 mg total) by mouth 2 (two) times daily. Patient taking differently: Take 40 mg by mouth daily.  06/22/23 08/11/23  Anders Simmonds, PA-C  HYDROcodone-acetaminophen (NORCO) 10-325 MG tablet Take 1 tablet by mouth every 4 (four) hours as needed for severe pain (pain score 7-10). Patient taking differently: Take 1 tablet by mouth every 6 (six) hours as needed for severe pain (pain score 7-10). 05/02/23   Roddenberry, Cecille Amsterdam, PA-C  insulin glargine (LANTUS SOLOSTAR) 100 UNIT/ML Solostar Pen Inject 27 Units into the skin daily. 03/01/23   [name], MD  methocarbamol (ROBAXIN) 750 MG tablet Take 1 tablet (750 mg total) by mouth every 8 (eight) hours as needed for muscle spasms. 05/02/23   Leary Roca, PA-C  Misc. 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 Opiates overdose 10/21/22   Kathlen Mody, MD  polyethylene glycol (MIRALAX / GLYCOLAX) 17 g packet Mix 1 packet (17 g) in beverage and take by mouth 2 (two) times daily. 06/22/23   Anders Simmonds, PA-C  Potassium Chloride ER 20 MEQ TBCR Take 1 tablet by mouth daily at 12 noon.    [provider]  pregabalin (LYRICA) 75 MG capsule Take 1 capsule (75 mg total) by mouth 2 (two) times daily. 06/22/23   Anders Simmonds, PA-C  rosuvastatin (CRESTOR) 20 MG tablet Take 1 tablet (20 mg total) by mouth daily. 06/30/23   [name], MD  tamsulosin (FLOMAX) 0.4 MG CAPS capsule Take 1 capsule (0.4 mg total) by mouth daily after supper. 03/01/23   [name], MD  terbinafine (LAMISIL) 250 MG tablet Take 1 tablet (250 mg total) by mouth daily. 03/01/23   [name], MD  XTAMPZA ER 9 MG C12A Take 1 capsule by mouth every 12 (twelve) hours. 04/07/23   [provider]      Physical Exam: Vitals:   07/13/23 0030 07/13/23 0100 07/13/23 0115 07/13/23 0145  BP: (!) 170/112 (!) 151/95 (!) 125/91 96/74  Pulse: 100 (!) 139 (!) 150 (!) 149  Resp:  (!) 23 17 16   Temp:      TempSrc:      SpO2: 98% 93% (!) 89% 94%  Weight:      Height:        Physical Exam Vitals and nursing note reviewed.  Constitutional:      General: He is not in acute distress.    Appearance: He is ill-appearing.  Cardiovascular:     Rate and Rhythm: Normal rate and regular rhythm.     Pulses: Normal pulses.     Heart sounds: Normal heart sounds.  Pulmonary:     Effort: Pulmonary effort is normal.     Breath sounds: Normal breath sounds.  Abdominal:     General: There is distension.     Palpations: Abdomen is soft.  Musculoskeletal:        General: Tenderness present. No swelling, deformity or signs of injury.     Cervical back: Neck supple.     Right lower leg: No edema.     Left lower leg: No edema.  Skin:    General: Skin is dry.     Capillary Refill: Capillary refill takes less than 2 seconds.  Neurological:     Mental Status: He is alert and oriented to person, place, and time.  Psychiatric:        Mood and Affect: Mood normal.      Labs on Admission: I have personally reviewed following labs and imaging studies  CBC: Recent Labs  Lab 07/12/23 2105  WBC 9.0  NEUTROABS 6.9  HGB 13.6  HCT 41.4  MCV 91.6  PLT 215   Basic Metabolic Panel: Recent Labs  Lab 07/12/23 2105  NA 137  K 4.4  CL 102  CO2 23  GLUCOSE 158*  BUN 34*  CREATININE 1.72*  CALCIUM 9.6   GFR: Estimated Creatinine Clearance: 49.5 mL/min (A) (by C-G formula based on SCr of 1.72 mg/dL (H)). Liver Function Tests: No  results for input(s): "AST", "ALT", "ALKPHOS", "BILITOT", "PROT", "ALBUMIN" in the last 168 hours. No results for input(s): "LIPASE", "AMYLASE" in the last 168 hours. No results for input(s): "AMMONIA" in the last 168 hours. Coagulation Profile: No results for  input(s): "INR", "PROTIME" in the last 168 hours. Cardiac Enzymes: No results for input(s): "CKTOTAL", "CKMB", "CKMBINDEX", "TROPONINI", "TROPONINIHS" in the last 168 hours. BNP (last 3 results) Recent Labs    08/14/22 0341 08/15/22 0400 04/09/23 1101  BNP 335.1* 193.9* 419.7*   HbA1C: No results for input(s): "HGBA1C" in the last 72 hours. CBG: Recent Labs  Lab 07/13/23 0025  GLUCAP 152*   Lipid Profile: No results for input(s): "CHOL", "HDL", "LDLCALC", "TRIG", "CHOLHDL", "LDLDIRECT" in the last 72 hours. Thyroid Function Tests: No results for input(s): "TSH", "T4TOTAL", "FREET4", "T3FREE", "THYROIDAB" in the last 72 hours. Anemia Panel: No results for input(s): "VITAMINB12", "FOLATE", "FERRITIN", "TIBC", "IRON", "RETICCTPCT" in the last 72 hours. Urine analysis:    Component Value Date/Time   COLORURINE YELLOW 04/22/2023 0642   APPEARANCEUR HAZY (A) 04/22/2023 0642   LABSPEC 1.017 04/22/2023 0642   PHURINE 5.0 04/22/2023 0642   GLUCOSEU NEGATIVE 04/22/2023 0642   HGBUR SMALL (A) 04/22/2023 0642   BILIRUBINUR NEGATIVE 04/22/2023 0642   BILIRUBINUR negative 09/25/2019 1555   KETONESUR NEGATIVE 04/22/2023 0642   PROTEINUR 100 (A) 04/22/2023 0642   UROBILINOGEN 1.0 09/25/2019 1555   NITRITE NEGATIVE 04/22/2023 0642   LEUKOCYTESUR TRACE (A) 04/22/2023 0642    Radiological Exams on Admission: I have personally reviewed images CT HEAD WO CONTRAST ( ) Result Date: 07/13/2023 CLINICAL DATA:  Trauma fell getting out of shower EXAM: CT HEAD WITHOUT CONTRAST TECHNIQUE: Contiguous axial images were obtained from the base of the skull through the vertex without intravenous contrast. RADIATION DOSE REDUCTION: This exam was performed according to the departmental dose-optimization program which includes automated exposure control, adjustment of the mA and/or kV according to patient size and/or use of iterative reconstruction technique. COMPARISON:  CT brain 02/20/2023 FINDINGS:  Brain: No acute territorial infarction, hemorrhage or intracranial mass. Mild atrophy. Mild chronic small vessel ischemic changes of the white matter. The ventricles are nonenlarged Vascular: No hyperdense vessels.  Carotid vascular calcification Skull: Normal. Negative for fracture or focal lesion. Sinuses/Orbits: Moderate mucosal thickening in the sinuses. Chronic appearing nasal bone deformity Other: None IMPRESSION: 1. No CT evidence for acute intracranial abnormality. 2. Mild atrophy and chronic small vessel ischemic changes of the white matter. 3. Moderate mucosal thickening in the sinuses. Electronically Signed   By: Jasmine Pang M.D.   On: 07/13/2023 00:02   DG Hip Unilat W or Wo Pelvis 2-3 Views Left Result Date: 07/12/2023 CLINICAL DATA:  Left hip pain after fall. EXAM: DG HIP (WITH OR WITHOUT PELVIS) 2-3V LEFT COMPARISON:  None Available. FINDINGS: Displaced left femoral neck fracture. Femoral head is well seated in the acetabulum. No hip dislocation. Bony pelvis and pubic rami are intact. Pubic symphysis and sacroiliac joints are congruent. IMPRESSION: Displaced left femoral neck fracture. Electronically Signed   By: Narda Rutherford M.D.   On: 07/12/2023 23:37   DG Chest 1 View Result Date: 07/12/2023 CLINICAL DATA:  Fall getting out of the shower. EXAM: CHEST  1 VIEW COMPARISON:  Chest radiograph 05/24/2023 FINDINGS: Prior median sternotomy with left atrial clipping. The heart is enlarged. Stable mediastinal contours. Lower lung volumes from prior exam. Similar vascular congestion. No pneumothorax or large pleural effusion. On limited assessment, no acute osseous findings. IMPRESSION: Stable cardiomegaly with vascular congestion.  Electronically Signed   By: Narda Rutherford M.D.   On: 07/12/2023 23:37     EKG: My personal interpretation of EKG shows: Atrial fibrillation heart rate 100.  There is no ST anterior abnormality.    Assessment/Plan: Principal Problem:   Closed left femoral  fracture (HCC) Active Problems:   Essential hypertension   Insulin dependent type 2 diabetes mellitus (HCC)   Permanent atrial fibrillation (HCC)   History of CABG in  04/2023   History of CHF (congestive heart failure)   Chronic kidney disease (CKD), stage III (moderate) (HCC)   Hyperlipidemia   BPH (benign prostatic hyperplasia)   Peripheral neuropathy   Left displaced femoral neck fracture (HCC)    Assessment and Plan: Displaced left-sided femoral neck fracture Mechanical fall -Patient present emergency department complaining of mechanical fall while taking shower.  He was landed on the left-sided hip joint.  Since then complaining of left-sided hip joint pain and unable to bear weight - At presentation to ED found to elevated blood pressure 197/186 response to the pain. - CBC unremarkable.  BMP unremarkable renal function. -X-ray of the hip showing displaced left femoral neck fracture. - Pending CT head. - X-ray of the hip showing left-sided femoral neck fracture.  Pending radiology official reading. - Orthopedist Dr. Fredrik Cove has been consulted recommended cardiology clearance and medical optimization.  Formal orthopedics consult to follow and tentative surgical intervention on Friday. -Dr. Windell Hummingbird spoke with on-call cardiology Dr. Lendell Caprice.  Per cardiology no need for heparin bridging with Eliquis given CABG CABG was performed in January 2025 3 months ago, recommended to continue aspirin and hold Eliquis until orthopedic surgery.  Waiting for formal cardiology consult for medical clearance. - Continue pain management.    History of CABG in January 2025 -Chest x-ray cardiomegaly with vascular congestion. -Apparently patient has not been taking aspirin for unknown reason.  Cardiology recommendation no need to heparin bridging with Eliquis and continue aspirin as of now.  Will resume Eliquis post surgery. -Continue Coreg and Crestor.  Permanent atrial fibrillation -EKG showed  atrial fibrillation heart rate 100.  Continue Coreg. -Will resume Eliquis postsurgery. 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  Obtain EKG which showing atrial flutter with predominant 2:1 AV block.  Heart rate 150. -Initial blood pressure was elevated 187/106 after pain control blood pressure has been improved to 125/91. -Concern for development of atrial flutter secondary in the response to pain and missing dose of Coreg for last 24 hours. -Giving amiodarone 150 bolus and starting amnio drip. -Continue Coreg 6.25 mg twice daily. -Continue cardiac monitoring. - Changing bed request telemetry to progressive unit.   History of chronic CHF-recovered EF ejection fraction Essential hypertension -Continue Coreg, Farxiga and Lasix.  CKD stage III -stable renal function.  Continue to monitor  Hyperlipidemia -continue Crestor.  Insulin-dependent DM type II -Continue Semglee 27 unit daily and sliding scale insulin with mealtime.  Peripheral neuropathy -Continue Lyrica 75 mg twice daily  BPH -Continue Flomax.    DVT prophylaxis:  SCDs.  Holding Eliquis for orthopedic intervention on Friday 07/14/2023 Code Status:  Full Code Diet: Heart healthy carb modified diet. Family Communication:   No family member currently at bedside Disposition Plan: Tentative plan for orthopedic intervention on Friday. Consults: Orthopedic surgeon and cardiology Admission status:   Inpatient, Telemetry bed  Severity of Illness: The appropriate patient status for this patient is INPATIENT. Inpatient status is judged to be reasonable and necessary in order to provide the  required intensity of service to ensure the patient's safety. The patient's presenting symptoms, physical exam findings, and initial radiographic and laboratory data in the context of their chronic comorbidities is felt to place them at high risk for further clinical deterioration. Furthermore, it is  not anticipated that the patient will be medically stable for discharge from the hospital within 2 midnights of admission.   * I certify that at the point of admission it is my clinical judgment that the patient will require inpatient hospital care spanning beyond 2 midnights from the point of admission due to high intensity of service, high risk for further deterioration and high frequency of surveillance required.Marland Kitchen    Tereasa Coop, MD [facility]  How to contact the [facility] Attending or Consulting provider 7A - 7P or covering provider during after hours 7P -7A, for this patient.  Check the care team in [facility] and look for a) attending/consulting [facility] provider listed and b) the [facility] team listed Log into [email or web address] and use [place]'s universal password to access. If you do not have the password, please contact the hospital operator. Locate the [facility] provider you are looking for under [facility] and page to a number that you can be directly reached. If you still have difficulty reaching the provider, please page the [facility] (Director on Call) for the Hospitalists listed on amion for assistance.  07/13/2023, 2:37 AM

## 2023-07-12 NOTE — ED Notes (Signed)
 Pt repositioned, requesting pain medication, MD made aware

## 2023-07-13 ENCOUNTER — Telehealth: Payer: Self-pay | Admitting: Cardiovascular Disease

## 2023-07-13 ENCOUNTER — Inpatient Hospital Stay (HOSPITAL_COMMUNITY)

## 2023-07-13 ENCOUNTER — Inpatient Hospital Stay (HOSPITAL_COMMUNITY): Admitting: Anesthesiology

## 2023-07-13 ENCOUNTER — Other Ambulatory Visit: Payer: Self-pay

## 2023-07-13 DIAGNOSIS — S72002A Fracture of unspecified part of neck of left femur, initial encounter for closed fracture: Secondary | ICD-10-CM | POA: Diagnosis present

## 2023-07-13 DIAGNOSIS — E119 Type 2 diabetes mellitus without complications: Secondary | ICD-10-CM | POA: Diagnosis not present

## 2023-07-13 DIAGNOSIS — N1832 Chronic kidney disease, stage 3b: Secondary | ICD-10-CM | POA: Diagnosis not present

## 2023-07-13 DIAGNOSIS — I2510 Atherosclerotic heart disease of native coronary artery without angina pectoris: Secondary | ICD-10-CM

## 2023-07-13 DIAGNOSIS — I10 Essential (primary) hypertension: Secondary | ICD-10-CM | POA: Diagnosis not present

## 2023-07-13 DIAGNOSIS — I4819 Other persistent atrial fibrillation: Secondary | ICD-10-CM | POA: Diagnosis not present

## 2023-07-13 DIAGNOSIS — I4821 Permanent atrial fibrillation: Secondary | ICD-10-CM | POA: Diagnosis not present

## 2023-07-13 DIAGNOSIS — Z01810 Encounter for preprocedural cardiovascular examination: Secondary | ICD-10-CM

## 2023-07-13 LAB — COMPREHENSIVE METABOLIC PANEL WITH GFR
ALT: 16 U/L (ref 0–44)
AST: 20 U/L (ref 15–41)
Albumin: 3.1 g/dL — ABNORMAL LOW (ref 3.5–5.0)
Alkaline Phosphatase: 70 U/L (ref 38–126)
Anion gap: 10 (ref 5–15)
BUN: 33 mg/dL — ABNORMAL HIGH (ref 8–23)
CO2: 22 mmol/L (ref 22–32)
Calcium: 8.8 mg/dL — ABNORMAL LOW (ref 8.9–10.3)
Chloride: 101 mmol/L (ref 98–111)
Creatinine, Ser: 1.77 mg/dL — ABNORMAL HIGH (ref 0.61–1.24)
GFR, Estimated: 43 mL/min — ABNORMAL LOW (ref >60)
Glucose, Bld: 187 mg/dL — ABNORMAL HIGH (ref 70–99)
Potassium: 3.9 mmol/L (ref 3.5–5.1)
Sodium: 133 mmol/L — ABNORMAL LOW (ref 135–145)
Total Bilirubin: 0.7 mg/dL (ref 0.0–1.2)
Total Protein: 6.3 g/dL — ABNORMAL LOW (ref 6.5–8.1)

## 2023-07-13 LAB — CBG MONITORING, ED
Glucose-Capillary: 152 mg/dL — ABNORMAL HIGH (ref 70–99)
Glucose-Capillary: 158 mg/dL — ABNORMAL HIGH (ref 70–99)
Glucose-Capillary: 188 mg/dL — ABNORMAL HIGH (ref 70–99)

## 2023-07-13 LAB — CBC
HCT: 36 % — ABNORMAL LOW (ref 39.0–52.0)
Hemoglobin: 11.8 g/dL — ABNORMAL LOW (ref 13.0–17.0)
MCH: 30.1 pg (ref 26.0–34.0)
MCHC: 32.8 g/dL (ref 30.0–36.0)
MCV: 91.8 fL (ref 80.0–100.0)
Platelets: 178 10*3/uL (ref 150–400)
RBC: 3.92 MIL/uL — ABNORMAL LOW (ref 4.22–5.81)
RDW: 13.9 % (ref 11.5–15.5)
WBC: 11.4 10*3/uL — ABNORMAL HIGH (ref 4.0–10.5)
nRBC: 0 % (ref 0.0–0.2)

## 2023-07-13 LAB — APTT: aPTT: 33 s (ref 24–36)

## 2023-07-13 LAB — TYPE AND SCREEN
ABO/RH(D): O NEG
Antibody Screen: NEGATIVE

## 2023-07-13 LAB — GLUCOSE, CAPILLARY
Glucose-Capillary: 363 mg/dL — ABNORMAL HIGH (ref 70–99)
Glucose-Capillary: 432 mg/dL — ABNORMAL HIGH (ref 70–99)

## 2023-07-13 LAB — SURGICAL PCR SCREEN
MRSA, PCR: NEGATIVE
Staphylococcus aureus: NEGATIVE

## 2023-07-13 LAB — MAGNESIUM: Magnesium: 1.8 mg/dL (ref 1.7–2.4)

## 2023-07-13 LAB — PROTIME-INR
INR: 1.2 (ref 0.8–1.2)
Prothrombin Time: 15.7 s — ABNORMAL HIGH (ref 11.4–15.2)

## 2023-07-13 MED ORDER — CEFAZOLIN SODIUM-DEXTROSE 2-4 GM/100ML-% IV SOLN
2.0000 g | INTRAVENOUS | Status: AC
  Administered 2023-07-14: 2 g via INTRAVENOUS
  Filled 2023-07-13: qty 100

## 2023-07-13 MED ORDER — OXYCODONE HCL 5 MG PO TABS
5.0000 mg | ORAL_TABLET | ORAL | Status: DC | PRN
  Administered 2023-07-13 – 2023-07-15 (×6): 5 mg via ORAL
  Filled 2023-07-13 (×6): qty 1

## 2023-07-13 MED ORDER — AMIODARONE LOAD VIA INFUSION
150.0000 mg | INTRAVENOUS | Status: DC
  Filled 2023-07-13: qty 83.34

## 2023-07-13 MED ORDER — AMIODARONE HCL IN DEXTROSE 360-4.14 MG/200ML-% IV SOLN
30.0000 mg/h | INTRAVENOUS | Status: DC
Start: 2023-07-13 — End: 2023-07-13

## 2023-07-13 MED ORDER — AMIODARONE LOAD VIA INFUSION: 150.0000 mg | Freq: Once | INTRAVENOUS | Status: DC

## 2023-07-13 MED ORDER — AMIODARONE HCL IN DEXTROSE 360-4.14 MG/200ML-% IV SOLN: 30.0000 mg/h | INTRAVENOUS | Status: DC

## 2023-07-13 MED ORDER — ROPIVACAINE HCL 5 MG/ML IJ SOLN
INTRAMUSCULAR | Status: DC | PRN
  Administered 2023-07-13: 30 mL via PERINEURAL

## 2023-07-13 MED ORDER — LABETALOL HCL 5 MG/ML IV SOLN: 10.0000 mg | INTRAVENOUS | Status: DC | PRN

## 2023-07-13 MED ORDER — TRANEXAMIC ACID-NACL 1000-0.7 MG/100ML-% IV SOLN
1000.0000 mg | INTRAVENOUS | Status: DC
  Filled 2023-07-13: qty 100

## 2023-07-13 MED ORDER — DEXAMETHASONE SODIUM PHOSPHATE 10 MG/ML IJ SOLN
INTRAMUSCULAR | Status: DC | PRN
  Administered 2023-07-13: 10 mg via INTRAVENOUS

## 2023-07-13 MED ORDER — CARVEDILOL 3.125 MG PO TABS: 6.2500 mg | ORAL_TABLET | Freq: Two times a day (BID) | ORAL | Status: DC

## 2023-07-13 MED ORDER — SODIUM CHLORIDE 0.9 % IV BOLUS
500.0000 mL | INTRAVENOUS | Status: AC
  Administered 2023-07-13: 500 mL via INTRAVENOUS

## 2023-07-13 MED ORDER — AMIODARONE HCL IN DEXTROSE 360-4.14 MG/200ML-% IV SOLN
60.0000 mg/h | INTRAVENOUS | Status: DC
  Filled 2023-07-13: qty 200

## 2023-07-13 MED ORDER — INSULIN GLARGINE-YFGN 100 UNIT/ML ~~LOC~~ SOLN
15.0000 [IU] | Freq: Every day | SUBCUTANEOUS | Status: DC
  Administered 2023-07-13 – 2023-07-15 (×3): 15 [IU] via SUBCUTANEOUS
  Filled 2023-07-13 (×3): qty 0.15

## 2023-07-13 MED ORDER — AMIODARONE LOAD VIA INFUSION
150.0000 mg | INTRAVENOUS | Status: AC
  Administered 2023-07-13: 150 mg via INTRAVENOUS
  Filled 2023-07-13: qty 83.34

## 2023-07-13 MED ORDER — TRANEXAMIC ACID-NACL 1000-0.7 MG/100ML-% IV SOLN
1000.0000 mg | INTRAVENOUS | Status: AC
  Administered 2023-07-14: 1000 mg via INTRAVENOUS
  Filled 2023-07-13 (×2): qty 100

## 2023-07-13 MED ORDER — CARVEDILOL 6.25 MG PO TABS
6.2500 mg | ORAL_TABLET | Freq: Two times a day (BID) | ORAL | Status: DC
  Administered 2023-07-13: 6.25 mg via ORAL
  Filled 2023-07-13: qty 1

## 2023-07-13 MED ORDER — AMIODARONE HCL IN DEXTROSE 360-4.14 MG/200ML-% IV SOLN: 60.0000 mg/h | INTRAVENOUS | Status: DC

## 2023-07-13 MED ORDER — CHLORHEXIDINE GLUCONATE 4 % EX SOLN
60.0000 mL | Freq: Once | CUTANEOUS | Status: AC
  Administered 2023-07-14: 4 via TOPICAL
  Filled 2023-07-13: qty 60

## 2023-07-13 MED ORDER — OXYCODONE HCL ER 10 MG PO T12A
10.0000 mg | EXTENDED_RELEASE_TABLET | Freq: Two times a day (BID) | ORAL | Status: DC
  Administered 2023-07-13 – 2023-07-15 (×4): 10 mg via ORAL
  Filled 2023-07-13 (×4): qty 1

## 2023-07-13 MED ORDER — AMIODARONE HCL IN DEXTROSE 360-4.14 MG/200ML-% IV SOLN
30.0000 mg/h | INTRAVENOUS | Status: DC
  Administered 2023-07-13 – 2023-07-14 (×5): 30 mg/h via INTRAVENOUS
  Filled 2023-07-13 (×3): qty 200

## 2023-07-13 MED ORDER — SENNOSIDES-DOCUSATE SODIUM 8.6-50 MG PO TABS
2.0000 | ORAL_TABLET | Freq: Every day | ORAL | Status: DC
  Administered 2023-07-14 – 2023-07-15 (×2): 2 via ORAL
  Filled 2023-07-13 (×3): qty 2

## 2023-07-13 MED ORDER — POVIDONE-IODINE 10 % EX SWAB
2.0000 | Freq: Once | CUTANEOUS | Status: AC
  Administered 2023-07-14: 2 via TOPICAL

## 2023-07-13 MED ORDER — FENTANYL CITRATE (PF) 100 MCG/2ML IJ SOLN
INTRAMUSCULAR | Status: AC
  Administered 2023-07-13: 100 ug
  Filled 2023-07-13: qty 2

## 2023-07-13 MED ORDER — FENTANYL CITRATE PF 50 MCG/ML IJ SOSY
12.5000 ug | PREFILLED_SYRINGE | INTRAMUSCULAR | Status: DC | PRN
  Administered 2023-07-13 – 2023-07-14 (×2): 25 ug via INTRAVENOUS
  Filled 2023-07-13 (×2): qty 1

## 2023-07-13 MED ORDER — POVIDONE-IODINE 10 % EX SWAB: 2.0000 | Freq: Once | CUTANEOUS | Status: DC

## 2023-07-13 MED ORDER — ACETAMINOPHEN 10 MG/ML IV SOLN
1000.0000 mg | Freq: Once | INTRAVENOUS | Status: AC
  Administered 2023-07-13: 1000 mg via INTRAVENOUS
  Filled 2023-07-13: qty 100

## 2023-07-13 MED ORDER — ALPRAZOLAM 0.5 MG PO TABS
0.5000 mg | ORAL_TABLET | Freq: Every evening | ORAL | Status: DC | PRN
  Administered 2023-07-13 – 2023-07-20 (×6): 0.5 mg via ORAL
  Filled 2023-07-13 (×6): qty 1
  Filled 2023-07-13: qty 2

## 2023-07-13 MED ORDER — HYDRALAZINE HCL 20 MG/ML IJ SOLN: 10.0000 mg | Freq: Four times a day (QID) | INTRAMUSCULAR | Status: DC | PRN

## 2023-07-13 MED ORDER — AMIODARONE HCL IN DEXTROSE 360-4.14 MG/200ML-% IV SOLN
60.0000 mg/h | INTRAVENOUS | Status: AC
  Administered 2023-07-13: 60 mg/h via INTRAVENOUS
  Filled 2023-07-13: qty 200

## 2023-07-13 NOTE — Progress Notes (Signed)
   07/13/23 1535  TOC Brief Assessment  Insurance and Status  (Medicaid Pre paid health plan/ [place] Medicaid [employer])  Patient has primary care physician Yes Alvis Lemmings, [place], MD)  Home environment has been reviewed From home with Spouse  Prior level of function: independent  Prior/Current Home Services Current home services ([facility])  Readmission risk has been reviewed Yes (39%)  Transition of care needs no transition of care needs at this time   No TOC needs identified at this time but will follow for recommendations

## 2023-07-13 NOTE — Consult Note (Signed)
 Cardiology Consultation   Patient ID: [name] MRN: [id number]; DOB: 1960/08/12  Admit date: 07/12/2023 Date of Consult: 07/13/2023  PCP:  [name], MD   [facility] Providers Cardiologist:  Charlton Haws, MD        Patient Profile:   David [name] is a 63 y.o. male with a hx of coronary artery disease status post CABG, heart failure with recovered ejection fraction, atrial fibrillation and atrial flutter, hypertension, type 2 diabetes who is being seen 07/13/2023 for preoperative cardiovascular evaluation at the request of Dr. Rito Ehrlich.  History of Present Illness:   David [name] has hx atrial fibrillation as well as HFrEF. Previously patient had improvement in LV function with rate control. Echocardiogram done on 04/11/23 revealing normal LVEF, same as 07/15/2022, 09/03/2021, and 08/31/2021 echocardiogram improved from 35 to 40% from 10/17/2018. In January 2025, patient presented with acute decompensated heart failure with abnormal EKG.  Patient underwent LHC, found with multi-vessel CAD. Patient underwent CABG with LIMA to LAD, GV to PDA, VG to OM1 to OM3, PV ablation and LAA ligation. During this admission, patient with persistent atrial fibrillation, was managed with Eliquis and PO Amiodarone.  On 04/09 patient was getting out of his shower when he slipped resulting in direct impact of his left hip on side of the tub. Patient reports that he was able to avoid hitting his head. He had sever pain of the left hip following this fall, was unable to get out of the bathroom. EMS activated and patient brought to the ED where he was found with displaced left femoral neck fracture. While in the ED, patient noted to have acute hypertension and atrial flutter with RVR 2:1 conduction with severe pain. Amiodarone infusion initiated.  Given recent CABG, cardiology asked to see patient today for preoperative evaluation. Patient evaluated in the ED, expresses frustration with his fall given  significant recovery following both his CABG and back surgeries.  Patient has done quite well following is CABG, regularly working with therapy and walking up to 650 meters without chest pain or dyspnea. He denies any symptoms of both currently and historically with atrial fibrillation/flutter.    Past Medical History:  Diagnosis Date   Cardiomyopathy (HCC)    a. EF 45% in 2019.   CHF (congestive heart failure) (HCC)    Chronic anticoagulation 09/30/2017   Diabetes mellitus type 2 in nonobese [facility])    Discitis of thoracic region 09/25/2022   Does not have health insurance    Essential hypertension 09/26/2017   Financial difficulties    H/O noncompliance with medical treatment, presenting hazards to health    Hardware complicating wound infection (HCC) 09/25/2022   Non-insulin treated type 2 diabetes mellitus (HCC) 09/26/2017   Persistent atrial fibrillation (HCC)    Sleep apnea suspected 09/30/2017   "years ago"- not anymore per patient    Past Surgical History:  Procedure Laterality Date   ABDOMINAL AORTOGRAM W/LOWER EXTREMITY N/A 09/02/2021   Procedure: ABDOMINAL AORTOGRAM W/LOWER EXTREMITY;  Surgeon: Nada Libman, MD;  [facility]: [facility];  Service: Cardiovascular;  Laterality: N/A;   ABDOMINAL AORTOGRAM W/LOWER EXTREMITY N/A 12/28/2021   Procedure: ABDOMINAL AORTOGRAM W/LOWER EXTREMITY;  Surgeon: Nada Libman, MD;  Location: [facility];  Service: Cardiovascular;  Laterality: N/A;   ABDOMINAL AORTOGRAM W/LOWER EXTREMITY N/A 07/28/2022   Procedure: ABDOMINAL AORTOGRAM W/LOWER EXTREMITY;  Surgeon: Cephus Shelling, MD;  Location: [facility];  Service: Cardiovascular;  Laterality: N/A;  AMPUTATION Right 09/04/2021   Procedure: 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;  Surgeon: Nadara Mustard, MD;  Location: [facility] OR;  Service: Orthopedics;  Laterality: Right;   AMPUTATION Right 08/02/2022   Procedure: AMPUTATION RAY 5TH METATARSAL OF RIGHT FOOT;  Surgeon:  [name], MD;  Location: [facility] OR;  Service: Orthopedics;  Laterality: Right;   APPENDECTOMY  1971   BONE BIOPSY Right 08/02/2022   Procedure: BONE BIOPSY OF 5TH METATARSAL RIGHT FOOT;  Surgeon: [name], MD;  Location: [facility] OR;  Service: Orthopedics;  Laterality: Right;   CARDIOVERSION N/A 09/28/2017   Procedure: CARDIOVERSION;  Surgeon: Thurmon Fair, MD;  Location: [facility]OS[facility];  Service: Cardiovascular;  Laterality: N/A;   CLIPPING OF ATRIAL APPENDAGE N/A 04/18/2023   Procedure: CLIPPING OF ATRIAL APPENDAGE USING ATRICURE ATRICLIP [id number] with pulmonary vein isolation ablation;  Surgeon: Alleen Borne, MD;  Location: [facility] OR;  Service: Open Heart Surgery;  Laterality: N/A;   CORONARY ARTERY BYPASS GRAFT N/A 04/18/2023   Procedure: CORONARY ARTERY BYPASS GRAFTING (CABG) TIMES FOUR USING LEFT INTERNAL MAMMARY ARTERY AND ENDOSCOPICALLY HARVESTED LEFT GREATER SAPHENOUS VEIN;  Surgeon: Alleen Borne, MD;  Location: [facility];  Service: Open Heart Surgery;  Laterality: N/A;   IRRIGATION AND DEBRIDEMENT FOOT  08/02/2022   Procedure: IRRIGATION AND DEBRIDEMENT RIGHT 5TH METATARSAL FOOT;  Surgeon: [name], MD;  Location: [facility];  Service: Orthopedics;;   LAMINECTOMY WITH POSTERIOR LATERAL ARTHRODESIS LEVEL 4 N/A 08/30/2022   Procedure: THORACIC FIVE-THORACIC ELEVEN FUSION, THORACIC EIGHT TRANSPEDICULAR DECOMPRESSION AND PARTIAL CORPECTOMY with O-Arm;  Surgeon: Bedelia Person, MD;  Location: [facility] OR;  Service: Neurosurgery;  Laterality: N/A;   LEFT HEART CATH AND CORONARY ANGIOGRAPHY N/A 04/12/2023   Procedure: LEFT HEART CATH AND CORONARY ANGIOGRAPHY;  Surgeon: Kathleene Hazel, MD;  [facility];  Service: Cardiovascular;  Laterality: N/A;   NASAL ENDOSCOPY Right 06/30/2023   Procedure: ENDOSCOPY, NOSE;  Surgeon: Serena Colonel, MD;  Location: [facility] OR;  Service: ENT;  Laterality: Right;   PERIPHERAL VASCULAR BALLOON ANGIOPLASTY  09/02/2021   Procedure:  PERIPHERAL VASCULAR BALLOON ANGIOPLASTY;  Surgeon: Nada Libman, MD;  [facility];  Service: Cardiovascular;;   PERIPHERAL VASCULAR BALLOON ANGIOPLASTY  12/28/2021   Procedure: PERIPHERAL VASCULAR BALLOON ANGIOPLASTY;  Surgeon: Nada Libman, MD;  [facility];  Service: Cardiovascular;;  Posterior Tibial PTA only   POLYPECTOMY Right 06/30/2023   Procedure: POLYPECTOMY, NASAL CAVITY;  Surgeon: Serena Colonel, MD;  Location: [facility] OR;  Service: ENT;  Laterality: Right;   TEE WITHOUT CARDIOVERSION N/A 09/28/2017   Procedure: TRANSESOPHAGEAL ECHOCARDIOGRAM (TEE);  Surgeon: Thurmon Fair, MD;  Location: [facility] ENDOSCOPY;  Service: Cardiovascular;  Laterality: N/A;   TEE WITHOUT CARDIOVERSION N/A 09/03/2021   Procedure: TRANSESOPHAGEAL ECHOCARDIOGRAM (TEE);  Surgeon: Chilton Si, MD;  Location: [facility] ENDOSCOPY;  Service: Cardiovascular;  Laterality: N/A;   TEE WITHOUT CARDIOVERSION N/A 07/15/2022   Procedure: TRANSESOPHAGEAL ECHOCARDIOGRAM;  Surgeon: Thurmon Fair, MD;  [facility];  Service: Cardiovascular;  Laterality: N/A;   TEE WITHOUT CARDIOVERSION N/A 04/18/2023   Procedure: TRANSESOPHAGEAL ECHOCARDIOGRAM (TEE);  Surgeon: Alleen Borne, MD;  Location: [facility] OR;  Service: Open Heart Surgery;  Laterality: N/A;     Home Medications:  Prior to Admission medications   Medication Sig Start Date End Date Taking? Authorizing Provider  ALPRAZolam Prudy Feeler) 0.5 MG tablet Take 1 tablet (0.5 mg total) by mouth at bedtime as needed for anxiety or sleep. 05/02/23  Yes Roddenberry,  Cecille Amsterdam, PA-C  apixaban (ELIQUIS) 5 MG TABS tablet Take 1 tablet (5 mg total) by mouth 2 (two) times daily. 05/15/23  Yes Nahser, Deloris Ping, MD  carvedilol (COREG) 6.25 MG tablet Take 1 tablet (6.25 mg total) by mouth 2 (two) times daily. 06/16/23  Yes Asa Lente, Tessa N, PA-C  dapagliflozin propanediol (FARXIGA) 10 MG TABS tablet Take 1 tablet (10 mg total) by mouth daily before breakfast.  03/06/23  Yes Newlin, Enobong, MD  doxycycline (VIBRA-TABS) 100 MG tablet Take 1 tablet (100 mg total) by mouth every 12 (twelve) hours. 03/20/23  Yes Danelle Earthly, MD  famotidine (PEPCID) 20 MG tablet Take 1 tablet (20 mg total) by mouth 2 times daily. 06/22/23  Yes Georgian Co M, PA-C  furosemide (LASIX) 40 MG tablet Take 1 tablet (40 mg total) by mouth 2 (two) times daily. Patient taking differently: Take 40 mg by mouth daily. 06/22/23 08/11/23 Yes McClung, Marzella Schlein, PA-C  HYDROcodone-acetaminophen (NORCO) 10-325 MG tablet Take 1 tablet by mouth every 4 (four) hours as needed for severe pain (pain score 7-10). Patient taking differently: Take 1 tablet by mouth every 6 (six) hours as needed for severe pain (pain score 7-10). 05/02/23  Yes Roddenberry, Myron G, PA-C  insulin glargine (LANTUS SOLOSTAR) 100 UNIT/ML Solostar Pen Inject 27 Units into the skin daily. 03/01/23  Yes [name], MD  methocarbamol (ROBAXIN) 750 MG tablet Take 1 tablet (750 mg total) by mouth every 8 (eight) hours as needed for muscle spasms. Patient taking differently: Take 750 mg by mouth 2 (two) times daily as needed for muscle spasms. 05/02/23  Yes Roddenberry, Cecille Amsterdam, PA-C  naloxone (NARCAN) nasal spray 4 mg/0.1 mL Opiates overdose Patient taking differently: Place 1 spray into the nose once. Opiates overdose 10/21/22  Yes Kathlen Mody, MD  OXYCONTIN 10 MG 12 hr tablet Take 10 mg by mouth every 12 (twelve) hours. 07/06/23  Yes [provider]  polyethylene glycol (MIRALAX / GLYCOLAX) 17 g packet Mix 1 packet (17 g) in beverage and take by mouth 2 (two) times daily. Patient taking differently: Take 17 g by mouth every other day. 06/22/23  Yes Anders Simmonds, PA-C  Potassium Chloride ER 20 MEQ TBCR Take 20 mEq by mouth daily.   Yes [provider]  pregabalin (LYRICA) 75 MG capsule Take 1 capsule (75 mg total) by mouth 2 (two) times daily. 06/22/23  Yes Georgian Co M, PA-C  rosuvastatin (CRESTOR)  20 MG tablet Take 1 tablet (20 mg total) by mouth daily. 06/30/23  Yes [name], MD  tamsulosin (FLOMAX) 0.4 MG CAPS capsule Take 1 capsule (0.4 mg total) by mouth daily after supper. 03/01/23  Yes [name], MD  Continuous Glucose Sensor (DEXCOM G7 SENSOR) MISC Use as directed & change every 10 days. Patient not taking: Reported on 06/30/2023 03/10/23   [name], MD  Misc. 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 Patient not taking: Reported on 07/13/2023 03/01/23   [name], MD    Inpatient Medications: Scheduled Meds:  aspirin EC  81 mg Oral Daily   carvedilol  6.25 mg Oral BID WC   dapagliflozin propanediol  10  mg Oral QAC breakfast   famotidine  20 mg Oral BID   insulin aspart  0-5 Units Subcutaneous QHS   insulin aspart  0-6 Units Subcutaneous TID WC   insulin glargine-yfgn  15 Units Subcutaneous Daily   polyethylene glycol  17 g Oral BID   pregabalin  75 mg Oral BID   rosuvastatin  20 mg Oral Daily   sodium chloride flush  3 mL Intravenous Q12H   tamsulosin  0.4 mg Oral QPC supper   Continuous Infusions:  sodium chloride     amiodarone 30 mg/hr (07/13/23 0759)   PRN Meds: sodium chloride, acetaminophen **OR** acetaminophen, ALPRAZolam, fentaNYL (SUBLIMAZE) injection, hydrALAZINE, methocarbamol, ondansetron **OR** ondansetron (ZOFRAN) IV, senna-docusate, sodium chloride flush  Allergies:   No Known Allergies  Social History:   Social History   Socioeconomic History   Marital status: Married    Spouse name: Not on file   Number of children: Not on file   Years of education: Not on file   Highest education level: Some college, no  degree  Occupational History   Not on file  Tobacco Use   Smoking status: Former    Current packs/day: 0.00    Types: Cigarettes    Start date: 03/19/2003    Quit date: 03/18/2018    Years since quitting: 5.3   Smokeless tobacco: Never   Tobacco comments:    09/26/2017 "2-3 cigarettes/month now"  Vaping Use   Vaping status: Never Used  Substance and Sexual Activity   Alcohol use: Not Currently    Alcohol/week: 3.0 standard drinks of alcohol    Types: 3 Cans of beer per week    Comment: States he quit drinking 8-9 months ago   Drug use: Never   Sexual activity: Not Currently  Other Topics Concern   Not on file  Social History Narrative   Not on file   Social Drivers of Health   Financial Resource Strain: High Risk (05/28/2023)   Overall Financial Resource Strain (CARDIA)    Difficulty of Paying Living Expenses: Very hard  Food Insecurity: Food Insecurity Present (05/28/2023)   Hunger Vital Sign    Worried About Running Out of Food in the Last Year: Sometimes true    Ran Out of Food in the Last Year: Sometimes true  Transportation Needs: Unmet Transportation Needs (05/28/2023)   PRAPARE - Administrator, Civil Service (Medical): Yes    Lack of Transportation (Non-Medical): No  Physical Activity: Insufficiently Active (05/28/2023)   Exercise Vital Sign    Days of Exercise per Week: 1 day    Minutes of Exercise per Session: 20 min  Stress: Stress Concern Present (05/28/2023)   [employer] of Occupational Health - Occupational Stress Questionnaire    Feeling of Stress : Rather much  Social Connections: Unknown (05/28/2023)   Social Connection and Isolation Panel [NHANES]    Frequency of Communication with Friends and Family: Once a week    Frequency of Social Gatherings with Friends and Family: Patient declined    Attends Religious Services: Never    Database administrator or Organizations: No    Attends Engineer, structural: Not on file    Marital  Status: Married  Catering manager Violence: Not At Risk (04/10/2023)   Humiliation, Afraid, Rape, and Kick questionnaire    Fear of Current or Ex-Partner: No    Emotionally Abused: No    Physically Abused: No    Sexually Abused: No    Family History:  Family History  Problem Relation Age of Onset   Hypertension Mother      ROS:  Please see the history of present illness.   All other ROS reviewed and negative.     Physical Exam/Data:   Vitals:   07/13/23 0721 07/13/23 1018 07/13/23 1110 07/13/23 1115  BP:  112/75 (!) 142/84 (!) 147/90  Pulse:  67 82 83  Resp:  19 17 12   Temp: 97.9 F (36.6 C) 98.4 F (36.9 C)    TempSrc: Oral Oral    SpO2:  97% 98% 92%  Weight:      Height:        Intake/Output Summary (Last 24 hours) at 07/13/2023 1301 Last data filed at 07/13/2023 0414 Gross per 24 hour  Intake 600.13 ml  Output --  Net 600.13 ml      07/12/2023    8:34 PM 06/30/2023    8:10 AM 06/22/2023    9:20 AM  Last 3 Weights  Weight (lbs) 180 lb 180 lb 182 lb 6.4 oz  Weight (kg) 81.647 kg 81.647 kg 82.736 kg     Body mass index is 24.41 kg/m.  General:  Well nourished, well developed, in no acute distress HEENT: normal Neck: no JVD Vascular: No carotid bruits; Distal pulses 2+ bilaterally Cardiac:  normal S1, S2; rapid and irregular rhythm; no murmur  Lungs:  clear to auscultation bilaterally, no wheezing, rhonchi or rales  Abd: soft, nontender, no hepatomegaly  Ext: no edema Musculoskeletal:  BUE strength normal and equal. Left leg with external rotation. Skin: warm and dry  Neuro:  CNs 2-12 intact, no focal abnormalities noted Psych:  Normal affect   EKG:  The EKG was personally reviewed and demonstrates:  atrial flutter with 2:1 conduction. Ventricular rate 150bpm. Telemetry:  Telemetry was personally reviewed and demonstrates:  atrial flutter with improved rate control on Amiodarone, ventricular rate 90s-100s.  Relevant CV Studies: 04/11/23  TTE  IMPRESSIONS     1. Left ventricular ejection fraction, by estimation, is 65 to 70%. The  left ventricle has normal function. The left ventricle has no regional  wall motion abnormalities. There is mild concentric left ventricular  hypertrophy. Left ventricular diastolic  function could not be evaluated.   2. Right ventricular systolic function is mildly reduced. The right  ventricular size is mildly enlarged. Tricuspid regurgitation signal is  inadequate for assessing PA pressure.   3. Left atrial size was moderately dilated.   4. The mitral valve is grossly normal. Mild mitral valve regurgitation.  No evidence of mitral stenosis.   5. The aortic valve is tricuspid. Aortic valve regurgitation is not  visualized. No aortic stenosis is present.   6. The inferior vena cava is dilated in size with <50% respiratory  variability, suggesting right atrial pressure of 15 mmHg.   Comparison(s): No significant change from prior study.   FINDINGS   Left Ventricle: Left ventricular ejection fraction, by estimation, is 65  to 70%. The left ventricle has normal function. The left ventricle has no  regional wall motion abnormalities. Global longitudinal strain performed  but not reported based on  interpreter judgement due to suboptimal tracking. The left ventricular  internal cavity size was normal in size. There is mild concentric left  ventricular hypertrophy. Left ventricular diastolic function could not be  evaluated due to atrial fibrillation.  Left ventricular diastolic function could not be evaluated.   Right Ventricle: The right ventricular size is mildly enlarged. No  increase in right ventricular  wall thickness. Right ventricular systolic  function is mildly reduced. Tricuspid regurgitation signal is inadequate  for assessing PA pressure.   Left Atrium: Left atrial size was moderately dilated.   Right Atrium: Right atrial size was normal in size.   Pericardium: There is no  evidence of pericardial effusion.   Mitral Valve: The mitral valve is grossly normal. Mild mitral valve  regurgitation. No evidence of mitral valve stenosis.   Tricuspid Valve: The tricuspid valve is grossly normal. Tricuspid valve  regurgitation is mild . No evidence of tricuspid stenosis.   Aortic Valve: The aortic valve is tricuspid. Aortic valve regurgitation is  not visualized. No aortic stenosis is present.   Pulmonic Valve: The pulmonic valve was grossly normal. Pulmonic valve  regurgitation is not visualized. No evidence of pulmonic stenosis.   Aorta: The aortic root and ascending aorta are structurally normal, with  no evidence of dilitation.   Venous: The inferior vena cava is dilated in size with less than 50%  respiratory variability, suggesting right atrial pressure of 15 mmHg.   IAS/Shunts: The atrial septum is grossly normal.   Laboratory Data:  High Sensitivity Troponin:  No results for input(s): "TROPONINIHS" in the last 720 hours.   Chemistry Recent Labs  Lab 07/12/23 2105 07/13/23 0544  NA 137 133*  K 4.4 3.9  CL 102 101  CO2 23 22  GLUCOSE 158* 187*  BUN 34* 33*  CREATININE 1.72* 1.77*  CALCIUM 9.6 8.8*  MG  --  1.8  GFRNONAA 45* 43*  ANIONGAP 12 10    Recent Labs  Lab 07/13/23 0544  PROT 6.3*  ALBUMIN 3.1*  AST 20  ALT 16  ALKPHOS 70  BILITOT 0.7   Lipids No results for input(s): "CHOL", "TRIG", "HDL", "LABVLDL", "LDLCALC", "CHOLHDL" in the last 168 hours.  Hematology Recent Labs  Lab 07/12/23 2105 07/13/23 0544  WBC 9.0 11.4*  RBC 4.52 3.92*  HGB 13.6 11.8*  HCT 41.4 36.0*  MCV 91.6 91.8  MCH 30.1 30.1  MCHC 32.9 32.8  RDW 13.4 13.9  PLT 215 178   Thyroid No results for input(s): "TSH", "FREET4" in the last 168 hours.  BNPNo results for input(s): "BNP", "PROBNP" in the last 168 hours.  DDimer No results for input(s): "DDIMER" in the last 168 hours.   Radiology/Studies:  CT HEAD WO CONTRAST ( ) Result Date:  07/13/2023 CLINICAL DATA:  Trauma fell getting out of shower EXAM: CT HEAD WITHOUT CONTRAST TECHNIQUE: Contiguous axial images were obtained from the base of the skull through the vertex without intravenous contrast. RADIATION DOSE REDUCTION: This exam was performed according to the departmental dose-optimization program which includes automated exposure control, adjustment of the mA and/or kV according to patient size and/or use of iterative reconstruction technique. COMPARISON:  CT brain 02/20/2023 FINDINGS: Brain: No acute territorial infarction, hemorrhage or intracranial mass. Mild atrophy. Mild chronic small vessel ischemic changes of the white matter. The ventricles are nonenlarged Vascular: No hyperdense vessels.  Carotid vascular calcification Skull: Normal. Negative for fracture or focal lesion. Sinuses/Orbits: Moderate mucosal thickening in the sinuses. Chronic appearing nasal bone deformity Other: None IMPRESSION: 1. No CT evidence for acute intracranial abnormality. 2. Mild atrophy and chronic small vessel ischemic changes of the white matter. 3. Moderate mucosal thickening in the sinuses. Electronically Signed   By: Jasmine Pang M.D.   On: 07/13/2023 00:02   DG Hip Unilat W or Wo Pelvis 2-3 Views Left Result Date: 07/12/2023 CLINICAL DATA:  Left hip pain after  fall. EXAM: DG HIP (WITH OR WITHOUT PELVIS) 2-3V LEFT COMPARISON:  None Available. FINDINGS: Displaced left femoral neck fracture. Femoral head is well seated in the acetabulum. No hip dislocation. Bony pelvis and pubic rami are intact. Pubic symphysis and sacroiliac joints are congruent. IMPRESSION: Displaced left femoral neck fracture. Electronically Signed   By: Narda Rutherford M.D.   On: 07/12/2023 23:37   DG Chest 1 View Result Date: 07/12/2023 CLINICAL DATA:  Fall getting out of the shower. EXAM: CHEST  1 VIEW COMPARISON:  Chest radiograph 05/24/2023 FINDINGS: Prior median sternotomy with left atrial clipping. The heart is enlarged.  Stable mediastinal contours. Lower lung volumes from prior exam. Similar vascular congestion. No pneumothorax or large pleural effusion. On limited assessment, no acute osseous findings. IMPRESSION: Stable cardiomegaly with vascular congestion. Electronically Signed   By: Narda Rutherford M.D.   On: 07/12/2023 23:37     Assessment and Plan:   CAD of native vessel Preoperative cardiovascular risk assessment Patient s/p CABG January 2025. LIMA to LAD, GV to PDA, VG to OM1 to OM3. Has done well following surgery, walking up to 650 meter and up stairs without symptoms of angina or dyspnea.  Continue ASA and Coreg. Patient with elevated baseline risk of major adverse cardiac events in perioperative setting due to his history of CAD with CABG, atrial fibrillation/flutter, CKD. He has no unstable cardiac conditions with recent revascularization and achieves >4 METS. As such, is at acceptable risk for proposed surgical procedure without further cardiac testing. Given generally elevated risk, would consider nerve block over general anesthesia.  Longstanding persistent atrial fibrillation/flutter Patient with atrial fibrillation since at least 2019, has failed prior cardioversion. Had PV ablation during recent CABG but continued with arrhythmia. Also had LAA ligation. 2:1 flutter with RVR on admission, now rate controlled on IV amiodarone. Patient in asymptomatic rate controlled flutter today. Given his longstanding hx afib/flutter, RVR in the setting of hip fracture unsurprising.  Reasonable to continue IV amiodarone with pending surgery, would consider continuing brief PO course at discharge.  Heparin bridge of Eliquis not needed. Please resume Eliquis as soon as safe to do so following surgery Continue Coreg 6.25mg  BID  HFrecEF Patient with preserved LVEF on recent echocardiograms after being found with reduced function in 2020. No recent symptoms of CHF.  Continue Coreg Continue SGLT2 following  surgery Plan to resume home Lasix 40mg  BID at discharge  Hypertension Patient initially hypertensive in the setting of severe pain. Subsequently hypotensive following administration of pain medication.  Continue home Coreg with close monitoring of vital signs.   Per primary team: Left hip fracture Diabetes CKD IIIb Peripheral neuropathy BPH  Risk Assessment/Risk Scores:          CHA2DS2-VASc Score = 4   This indicates a 4.8% annual risk of stroke. The patient's score is based upon: CHF History: 1 HTN History: 1 Diabetes History: 1 Stroke History: 0 Vascular Disease History: 1 Age Score: 0 Gender Score: 0         For questions or updates, please contact [facility] Please consult [email or web address] for contact info under    Signed, Perlie Gold, PA-C  07/13/2023 1:01 PM

## 2023-07-13 NOTE — Progress Notes (Signed)
 [facility] PROGRESS NOTE   RIVAAN KENDALL HQI:[id number] DOB: 08-Feb-1961 DOA: 07/12/2023  PCP: [name], MD  Brief History: 63 y.o. male with medical history significant of chronic atrial fibrillation, heart failure with reduced ejection fraction, essential hypertension, CABG in 04/2023, CKD stage IIIb, non-insulin-dependent DM type II, and hyperlipidemia, spinal surgery for infection last year who had a mechanical fall at home resulting in pain in the left hip area.  Found to have left hip fracture.  Hospitalized for further management.   Consultants: Cardiology.  Orthopedics  Procedures: None yet    Subjective/Interval History: Patient denies any chest pain or shortness of breath this morning.  Pain in the left hip is around 7 out of 10 in intensity.  No nausea vomiting.    Assessment/Plan:  Left hip fracture This was secondary to mechanical fall. Orthopedics has been consulted.  Cardiology has been consulted for preoperative evaluation due to recent CABG in January 2025. Pain control.  Bowel regimen. Eliquis is on hold.  Coronary artery disease status post CABG in January 2025 Apparently patient has not been taking aspirin for unknown reasons.  Eliquis currently on hold. Continue carvedilol and statin.   Await cardiology input.  Permanent atrial fibrillation Continue carvedilol.  He was noted to be in RVR overnight.  Had to be placed on amiodarone infusion.  Blood pressures were soft.  Noted to be in sinus rhythm this morning. Eliquis on hold due to upcoming surgery. Continue to monitor on telemetry.  Chronic systolic CHF with recovered EF Echocardiogram from January showed LVEF of 65 to 70%.  No evidence for volume overload currently.  Continue beta-blocker.  Also noted to be on furosemide prior to admission which is currently on hold. Noted to have borderline low blood pressures.  Continue to monitor.  Insulin-dependent diabetes mellitus type 2 HbA1c 7.4  in January.  On basal insulin at home.  Continue glargine and SSI.  Will need to adjust the dose of his insulin if surgery is planned for tomorrow.  Chronic kidney disease stage IIIb Renal function close to baseline.  Monitor urine output.  Avoid nephrotoxic agents.  Hyperlipidemia Continue statin.  Peripheral neuropathy Continue Lyrica.  History of BPH Continue Flomax  DVT Prophylaxis: Apixaban on hold.  SCDs for now.  Definitive prophylaxis after surgery Code Status: Full code Family Communication: Discussed with patient Disposition Plan: To be determined  Status is: Inpatient Remains inpatient appropriate because: Left hip fracture, atrial fibrillation      Medications: Scheduled:  aspirin EC  81 mg Oral Daily   carvedilol  6.25 mg Oral BID WC   dapagliflozin propanediol  10 mg Oral QAC breakfast   famotidine  20 mg Oral BID   insulin aspart  0-5 Units Subcutaneous QHS   insulin aspart  0-6 Units Subcutaneous TID WC   insulin glargine-yfgn  27 Units Subcutaneous Daily   polyethylene glycol  17 g Oral BID   pregabalin  75 mg Oral BID   rosuvastatin  20 mg Oral Daily   sodium chloride flush  3 mL Intravenous Q12H   tamsulosin  0.4 mg Oral QPC supper   Continuous:  sodium chloride     amiodarone 30 mg/hr (07/13/23 0759)   XLK:GMWNUU chloride, acetaminophen **OR** acetaminophen, ALPRAZolam, fentaNYL (SUBLIMAZE) injection, hydrALAZINE, methocarbamol, ondansetron **OR** ondansetron (ZOFRAN) IV, senna-docusate, sodium chloride flush  Antibiotics: Anti-infectives (From admission, onward)    None       Objective:  Vital Signs  Vitals:   07/13/23 0630 07/13/23  0645 07/13/23 0700 07/13/23 0721  BP: 98/68 98/82    Pulse: 69 64 64   Resp: 17 19 19    Temp:    97.9 F (36.6 C)  TempSrc:    Oral  SpO2: 95% 97% 95%   Weight:      Height:        Intake/Output Summary (Last 24 hours) at 07/13/2023 0904 Last data filed at 07/13/2023 0414 Gross per 24 hour   Intake 600.13 ml  Output --  Net 600.13 ml   Filed Weights   07/12/23 2034  Weight: 81.6 kg    General appearance: Awake alert.  In no distress Resp: Clear to auscultation bilaterally.  Normal effort Cardio: S1-S2 is normal regular.  No S3-S4.  No rubs murmurs or bruit GI: Abdomen is soft.  Nontender nondistended.  Bowel sounds are present normal.  No masses organomegaly Extremities: Lower extremity is externally rotated Neurologic: Alert and oriented x3.  No focal neurological deficits.    Lab Results:  Data Reviewed: I have personally reviewed following labs and reports of the imaging studies  CBC: Recent Labs  Lab 07/12/23 2105 07/13/23 0544  WBC 9.0 11.4*  NEUTROABS 6.9  --   HGB 13.6 11.8*  HCT 41.4 36.0*  MCV 91.6 91.8  PLT 215 178    Basic Metabolic Panel: Recent Labs  Lab 07/12/23 2105 07/13/23 0544  NA 137 133*  K 4.4 3.9  CL 102 101  CO2 23 22  GLUCOSE 158* 187*  BUN 34* 33*  CREATININE 1.72* 1.77*  CALCIUM 9.6 8.8*  MG  --  1.8    GFR: Estimated Creatinine Clearance: 48.1 mL/min (A) (by C-G formula based on SCr of 1.77 mg/dL (H)).  Liver Function Tests: Recent Labs  Lab 07/13/23 0544  AST 20  ALT 16  ALKPHOS 70  BILITOT 0.7  PROT 6.3*  ALBUMIN 3.1*    Coagulation Profile: Recent Labs  Lab 07/13/23 0544  INR 1.2    CBG: Recent Labs  Lab 07/13/23 0025 07/13/23 0800  GLUCAP 152* 158*     Radiology Studies: CT HEAD WO CONTRAST ( ) Result Date: 07/13/2023 CLINICAL DATA:  Trauma fell getting out of shower EXAM: CT HEAD WITHOUT CONTRAST TECHNIQUE: Contiguous axial images were obtained from the base of the skull through the vertex without intravenous contrast. RADIATION DOSE REDUCTION: This exam was performed according to the departmental dose-optimization program which includes automated exposure control, adjustment of the mA and/or kV according to patient size and/or use of iterative reconstruction technique. COMPARISON:  CT  brain 02/20/2023 FINDINGS: Brain: No acute territorial infarction, hemorrhage or intracranial mass. Mild atrophy. Mild chronic small vessel ischemic changes of the white matter. The ventricles are nonenlarged Vascular: No hyperdense vessels.  Carotid vascular calcification Skull: Normal. Negative for fracture or focal lesion. Sinuses/Orbits: Moderate mucosal thickening in the sinuses. Chronic appearing nasal bone deformity Other: None IMPRESSION: 1. No CT evidence for acute intracranial abnormality. 2. Mild atrophy and chronic small vessel ischemic changes of the white matter. 3. Moderate mucosal thickening in the sinuses. Electronically Signed   By: Jasmine Pang M.D.   On: 07/13/2023 00:02   DG Hip Unilat W or Wo Pelvis 2-3 Views Left Result Date: 07/12/2023 CLINICAL DATA:  Left hip pain after fall. EXAM: DG HIP (WITH OR WITHOUT PELVIS) 2-3V LEFT COMPARISON:  None Available. FINDINGS: Displaced left femoral neck fracture. Femoral head is well seated in the acetabulum. No hip dislocation. Bony pelvis and pubic rami are intact. Pubic symphysis  and sacroiliac joints are congruent. IMPRESSION: Displaced left femoral neck fracture. Electronically Signed   By: Narda Rutherford M.D.   On: 07/12/2023 23:37   DG Chest 1 View Result Date: 07/12/2023 CLINICAL DATA:  Fall getting out of the shower. EXAM: CHEST  1 VIEW COMPARISON:  Chest radiograph 05/24/2023 FINDINGS: Prior median sternotomy with left atrial clipping. The heart is enlarged. Stable mediastinal contours. Lower lung volumes from prior exam. Similar vascular congestion. No pneumothorax or large pleural effusion. On limited assessment, no acute osseous findings. IMPRESSION: Stable cardiomegaly with vascular congestion. Electronically Signed   By: Narda Rutherford M.D.   On: 07/12/2023 23:37       LOS: 1 day   Osvaldo Shipper  [facility] Pager on [email or web address]  07/13/2023, 9:04 AM

## 2023-07-13 NOTE — Progress Notes (Addendum)
 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  Obtain EKG which showing atrial flutter with predominant 2:1 AV block.  Heart rate 150. -Initial blood pressure was elevated 187/106 after pain control blood pressure has been improved to 125/91. -Concern for development of atrial flutter secondary in the response to pain and missing dose of Coreg for last 24 hours. -Giving amiodarone 150 bolus and starting amnio drip. -Continue Coreg 6.25 mg twice daily. -Continue cardiac monitoring. - Changing bed request telemetry to progressive unit.  Addendum - Patient's pressure has been dropped to 96/74 after giving the amiodarone bolus.  Giving 500 mL of NS bolus.  Adjusting timing for Coreg instead of tonight to tomorrow morning and holding Lasix.   Tereasa Coop, MD [facility] 07/13/2023, 1:22 AM

## 2023-07-13 NOTE — Consult Note (Signed)
 Reason for Consult:Left hip fx Referring Physician: Osvaldo Shipper Time called: 1478 Time at bedside: 0853   David [name] is an 63 y.o. male.  HPI: David [name] was getting out of the shower and drying off when he slipped in some water and fell. He had immediate left hip pain and could not get up. He was brought to the ED where x-rays showed a left hip fx and orthopedic surgery was consulted. He lives at home with his family, uses a cane if going out of the house s/p back surgery that left him with LLE weakness, and does not work.  Past Medical History:  Diagnosis Date   Cardiomyopathy (HCC)    a. EF 45% in 2019.   CHF (congestive heart failure) (HCC)    Chronic anticoagulation 09/30/2017   Diabetes mellitus type 2 in nonobese [facility])    Discitis of thoracic region 09/25/2022   Does not have health insurance    Essential hypertension 09/26/2017   Financial difficulties    H/O noncompliance with medical treatment, presenting hazards to health    Hardware complicating wound infection (HCC) 09/25/2022   Non-insulin treated type 2 diabetes mellitus (HCC) 09/26/2017   Persistent atrial fibrillation (HCC)    Sleep apnea suspected 09/30/2017   "years ago"- not anymore per patient    Past Surgical History:  Procedure Laterality Date   ABDOMINAL AORTOGRAM W/LOWER EXTREMITY N/A 09/02/2021   Procedure: ABDOMINAL AORTOGRAM W/LOWER EXTREMITY;  Surgeon: Nada Libman, MD;  [facility];  Service: Cardiovascular;  Laterality: N/A;   ABDOMINAL AORTOGRAM W/LOWER EXTREMITY N/A 12/28/2021   Procedure: ABDOMINAL AORTOGRAM W/LOWER EXTREMITY;  Surgeon: Nada Libman, MD;  [facility]: [facility];  Service: Cardiovascular;  Laterality: N/A;   ABDOMINAL AORTOGRAM W/LOWER EXTREMITY N/A 07/28/2022   Procedure: ABDOMINAL AORTOGRAM W/LOWER EXTREMITY;  Surgeon: Cephus Shelling, MD;  [facility]: [facility];  Service: Cardiovascular;  Laterality: N/A;   AMPUTATION Right 09/04/2021    Procedure: 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;  Surgeon: Nadara Mustard, MD;  Location[facility] OR;  Service: Orthopedics;  Laterality: Right;   AMPUTATION Right 08/02/2022   Procedure: AMPUTATION RAY 5TH METATARSAL OF RIGHT FOOT;  Surgeon: [name], MD;  Location: [facility] OR;  Service: Orthopedics;  Laterality: Right;   APPENDECTOMY  1971   BONE BIOPSY Right 08/02/2022   Procedure: BONE BIOPSY OF 5TH METATARSAL RIGHT FOOT;  Surgeon: [name], MD;  Location: [facility];  Service: Orthopedics;  Laterality: Right;   CARDIOVERSION N/A 09/28/2017   Procedure: CARDIOVERSION;  Surgeon: Thurmon Fair, MD;  [facility];  Service: Cardiovascular;  Laterality: N/A;   CLIPPING OF ATRIAL APPENDAGE N/A 04/18/2023   Procedure: CLIPPING OF ATRIAL APPENDAGE USING ATRICURE ATRICLIP [id number] with pulmonary vein isolation ablation;  Surgeon: Alleen Borne, MD;  Location: [facility] OR;  Service: Open Heart Surgery;  Laterality: N/A;   CORONARY ARTERY BYPASS GRAFT N/A 04/18/2023   Procedure: CORONARY ARTERY BYPASS GRAFTING (CABG) TIMES FOUR USING LEFT INTERNAL MAMMARY ARTERY AND ENDOSCOPICALLY HARVESTED LEFT GREATER SAPHENOUS VEIN;  Surgeon: Alleen Borne, MD;  Location: [facility];  Service: Open Heart Surgery;  Laterality: N/A;   IRRIGATION AND DEBRIDEMENT FOOT  08/02/2022   Procedure: IRRIGATION AND DEBRIDEMENT RIGHT 5TH METATARSAL FOOT;  Surgeon: [name], MD;  Location[facility];  Service: Orthopedics;;   LAMINECTOMY WITH POSTERIOR LATERAL ARTHRODESIS LEVEL 4 N/A 08/30/2022   Procedure: THORACIC FIVE-THORACIC ELEVEN FUSION, THORACIC EIGHT TRANSPEDICULAR DECOMPRESSION AND PARTIAL CORPECTOMY with O-Arm;  Surgeon:  Bedelia Person, MD;  Location: [facility] OR;  Service: Neurosurgery;  Laterality: N/A;   LEFT HEART CATH AND CORONARY ANGIOGRAPHY N/A 04/12/2023   Procedure: LEFT HEART CATH AND CORONARY ANGIOGRAPHY;  Surgeon: Kathleene Hazel, MD;  [facility];  Service: Cardiovascular;  Laterality:  N/A;   NASAL ENDOSCOPY Right 06/30/2023   Procedure: ENDOSCOPY, NOSE;  Surgeon: Serena Colonel, MD;  Location: [facility] OR;  Service: ENT;  Laterality: Right;   PERIPHERAL VASCULAR BALLOON ANGIOPLASTY  09/02/2021   Procedure: PERIPHERAL VASCULAR BALLOON ANGIOPLASTY;  Surgeon: Nada Libman, MD;  [facility];  Service: Cardiovascular;;   PERIPHERAL VASCULAR BALLOON ANGIOPLASTY  12/28/2021   Procedure: PERIPHERAL VASCULAR BALLOON ANGIOPLASTY;  Surgeon: Nada Libman, MD;  [facility];  Service: Cardiovascular;;  Posterior Tibial PTA only   POLYPECTOMY Right 06/30/2023   Procedure: POLYPECTOMY, NASAL CAVITY;  Surgeon: Serena Colonel, MD;  Location: [facility] OR;  Service: ENT;  Laterality: Right;   TEE WITHOUT CARDIOVERSION N/A 09/28/2017   Procedure: TRANSESOPHAGEAL ECHOCARDIOGRAM (TEE);  Surgeon: Thurmon Fair, MD;  Location: [facility] ENDOSCOPY;  Service: Cardiovascular;  Laterality: N/A;   TEE WITHOUT CARDIOVERSION N/A 09/03/2021   Procedure: TRANSESOPHAGEAL ECHOCARDIOGRAM (TEE);  Surgeon: Chilton Si, MD;  Location: [facility] ENDOSCOPY;  Service: Cardiovascular;  Laterality: N/A;   TEE WITHOUT CARDIOVERSION N/A 07/15/2022   Procedure: TRANSESOPHAGEAL ECHOCARDIOGRAM;  Surgeon: Thurmon Fair, MD;  [facility];  Service: Cardiovascular;  Laterality: N/A;   TEE WITHOUT CARDIOVERSION N/A 04/18/2023   Procedure: TRANSESOPHAGEAL ECHOCARDIOGRAM (TEE);  Surgeon: Alleen Borne, MD;  Location: [facility] OR;  Service: Open Heart Surgery;  Laterality: N/A;    Family History  Problem Relation Age of Onset   Hypertension Mother     Social History:  reports that he quit smoking about 5 years ago. His smoking use included cigarettes. He started smoking about 20 years ago. He has never used smokeless tobacco. He reports that he does not currently use alcohol after a past usage of about 3.0 standard drinks of alcohol per week. He reports that he does not use drugs.  Allergies: No  Known Allergies  Medications: I have reviewed the patient's current medications.  Results for orders placed or performed during the hospital encounter of 07/12/23 (from the past 48 hours)  CBC with Differential     Status: Abnormal   Collection Time: 07/12/23  9:05 PM  Result Value Ref Range   WBC 9.0 4.0 - 10.5 K/uL   RBC 4.52 4.22 - 5.81 MIL/uL   Hemoglobin 13.6 13.0 - 17.0 g/dL   HCT 16.1 09.6 - 04.5 %   MCV 91.6 80.0 - 100.0 fL   MCH 30.1 26.0 - 34.0 pg   MCHC 32.9 30.0 - 36.0 g/dL   RDW 40.9 81.1 - 91.4 %   Platelets 215 150 - 400 K/uL   nRBC 0.0 0.0 - 0.2 %   Neutrophils Relative % 76 %   Neutro Abs 6.9 1.7 - 7.7 K/uL   Lymphocytes Relative 11 %   Lymphs Abs 1.0 0.7 - 4.0 K/uL   Monocytes Relative 9 %   Monocytes Absolute 0.8 0.1 - 1.0 K/uL   Eosinophils Relative 2 %   Eosinophils Absolute 0.2 0.0 - 0.5 K/uL   Basophils Relative 1 %   Basophils Absolute 0.1 0.0 - 0.1 K/uL   Immature Granulocytes 1 %   Abs Immature Granulocytes 0.10 (H) 0.00 - 0.07 K/uL    Comment: Performed at [facility], [address], [place], [place] [address]  Basic metabolic panel     Status: Abnormal   Collection Time: 07/12/23  9:05 PM  Result Value Ref Range   Sodium 137 135 - 145 mmol/L   Potassium 4.4 3.5 - 5.1 mmol/L   Chloride 102 98 - 111 mmol/L   CO2 23 22 - 32 mmol/L   Glucose, Bld 158 (H) 70 - 99 mg/dL    Comment: Glucose reference range applies only to samples taken after fasting for at least 8 hours.   BUN 34 (H) 8 - 23 mg/dL   Creatinine, Ser 6.04 (H) 0.61 - 1.24 mg/dL   Calcium 9.6 8.9 - 54.0 mg/dL   GFR, Estimated 45 (L) >60 mL/min    Comment: (NOTE) Calculated using the CKD-EPI Creatinine Equation (2021)    Anion gap 12 5 - 15    Comment: Performed at [facility] Lab, [address], [place], [place] [address]  CBG monitoring, ED     Status: Abnormal   Collection Time: 07/13/23 12:25 AM  Result Value Ref Range   Glucose-Capillary 152 (H) 70 - 99 mg/dL     Comment: Glucose reference range applies only to samples taken after fasting for at least 8 hours.  CBC     Status: Abnormal   Collection Time: 07/13/23  5:44 AM  Result Value Ref Range   WBC 11.4 (H) 4.0 - 10.5 K/uL   RBC 3.92 (L) 4.22 - 5.81 MIL/uL   Hemoglobin 11.8 (L) 13.0 - 17.0 g/dL   HCT 14.7 (L) 82.9 - 56.2 %   MCV 91.8 80.0 - 100.0 fL   MCH 30.1 26.0 - 34.0 pg   MCHC 32.8 30.0 - 36.0 g/dL   RDW 13.0 86.5 - 78.4 %   Platelets 178 150 - 400 K/uL   nRBC 0.0 0.0 - 0.2 %    Comment: Performed at [facility] Lab, [address], [place], [place] [address]  Protime-INR     Status: Abnormal   Collection Time: 07/13/23  5:44 AM  Result Value Ref Range   Prothrombin Time 15.7 (H) 11.4 - 15.2 seconds   INR 1.2 0.8 - 1.2    Comment: (NOTE) INR goal varies based on device and disease states. Performed at [facility], [address], [place], [place] [address]   APTT     Status: None   Collection Time: 07/13/23  5:44 AM  Result Value Ref Range   aPTT 33 24 - 36 seconds    Comment: Performed at [facility], [address] [place], [place] [address]  Comprehensive metabolic panel     Status: Abnormal   Collection Time: 07/13/23  5:44 AM  Result Value Ref Range   Sodium 133 (L) 135 - 145 mmol/L   Potassium 3.9 3.5 - 5.1 mmol/L   Chloride 101 98 - 111 mmol/L   CO2 22 22 - 32 mmol/L   Glucose, Bld 187 (H) 70 - 99 mg/dL    Comment: Glucose reference range applies only to samples taken after fasting for at least 8 hours.   BUN 33 (H) 8 - 23 mg/dL   Creatinine, Ser 1.02 (H) 0.61 - 1.24 mg/dL   Calcium 8.8 (L) 8.9 - 10.3 mg/dL   Total Protein 6.3 (L) 6.5 - 8.1 g/dL   Albumin 3.1 (L) 3.5 - 5.0 g/dL   AST 20 15 - 41 U/L   ALT 16 0 - 44 U/L  Alkaline Phosphatase 70 38 - 126 U/L   Total Bilirubin 0.7 0.0 - 1.2 mg/dL   GFR, Estimated 43 (L) >60 mL/min    Comment: (NOTE) Calculated using the CKD-EPI Creatinine Equation (2021)    Anion gap 10 5 - 15    Comment:  Performed at [facility] Lab, [address] [place], [place] [address]  Magnesium     Status: None   Collection Time: 07/13/23  5:44 AM  Result Value Ref Range   Magnesium 1.8 1.7 - 2.4 mg/dL    Comment: Performed at [facility] Lab, [address], [place], [place] [address]  CBG monitoring, ED     Status: Abnormal   Collection Time: 07/13/23  8:00 AM  Result Value Ref Range   Glucose-Capillary 158 (H) 70 - 99 mg/dL    Comment: Glucose reference range applies only to samples taken after fasting for at least 8 hours.    CT HEAD WO CONTRAST ( ) Result Date: 07/13/2023 CLINICAL DATA:  Trauma fell getting out of shower EXAM: CT HEAD WITHOUT CONTRAST TECHNIQUE: Contiguous axial images were obtained from the base of the skull through the vertex without intravenous contrast. RADIATION DOSE REDUCTION: This exam was performed according to the departmental dose-optimization program which includes automated exposure control, adjustment of the mA and/or kV according to patient size and/or use of iterative reconstruction technique. COMPARISON:  CT brain 02/20/2023 FINDINGS: Brain: No acute territorial infarction, hemorrhage or intracranial mass. Mild atrophy. Mild chronic small vessel ischemic changes of the white matter. The ventricles are nonenlarged Vascular: No hyperdense vessels.  Carotid vascular calcification Skull: Normal. Negative for fracture or focal lesion. Sinuses/Orbits: Moderate mucosal thickening in the sinuses. Chronic appearing nasal bone deformity Other: None IMPRESSION: 1. No CT evidence for acute intracranial abnormality. 2. Mild atrophy and chronic small vessel ischemic changes of the white matter. 3. Moderate mucosal thickening in the sinuses. Electronically Signed   By: Jasmine Pang M.D.   On: 07/13/2023 00:02   DG Hip Unilat W or Wo Pelvis 2-3 Views Left Result Date: 07/12/2023 CLINICAL DATA:  Left hip pain after fall. EXAM: DG HIP (WITH OR WITHOUT PELVIS) 2-3V LEFT  COMPARISON:  None Available. FINDINGS: Displaced left femoral neck fracture. Femoral head is well seated in the acetabulum. No hip dislocation. Bony pelvis and pubic rami are intact. Pubic symphysis and sacroiliac joints are congruent. IMPRESSION: Displaced left femoral neck fracture. Electronically Signed   By: Narda Rutherford M.D.   On: 07/12/2023 23:37   DG Chest 1 View Result Date: 07/12/2023 CLINICAL DATA:  Fall getting out of the shower. EXAM: CHEST  1 VIEW COMPARISON:  Chest radiograph 05/24/2023 FINDINGS: Prior median sternotomy with left atrial clipping. The heart is enlarged. Stable mediastinal contours. Lower lung volumes from prior exam. Similar vascular congestion. No pneumothorax or large pleural effusion. On limited assessment, no acute osseous findings. IMPRESSION: Stable cardiomegaly with vascular congestion. Electronically Signed   By: Narda Rutherford M.D.   On: 07/12/2023 23:37    Review of Systems  HENT:  Negative for ear discharge, ear pain, hearing loss and tinnitus.   Eyes:  Negative for photophobia and pain.  Respiratory:  Negative for cough and shortness of breath.   Cardiovascular:  Negative for chest pain.  Gastrointestinal:  Negative for abdominal pain, nausea and vomiting.  Genitourinary:  Negative for dysuria, flank pain, frequency and urgency.  Musculoskeletal:  Positive for arthralgias (Left hip). Negative for back pain, myalgias and neck pain.  Neurological:  Negative for dizziness and headaches.  Hematological:  Does not bruise/bleed easily.  Psychiatric/Behavioral:  The patient is not nervous/anxious.    Blood pressure 98/82, pulse 64, temperature 97.9 F (36.6 C), temperature source Oral, resp. rate 19, height 6' (1.829 m), weight 81.6 kg, SpO2 95%. Physical Exam Constitutional:      General: He is not in acute distress.    Appearance: He is well-developed. He is not diaphoretic.  HENT:     Head: Normocephalic and atraumatic.  Eyes:     General: No  scleral icterus.       Right eye: No discharge.        Left eye: No discharge.     Conjunctiva/sclera: Conjunctivae normal.  Cardiovascular:     Rate and Rhythm: Normal rate and regular rhythm.  Pulmonary:     Effort: Pulmonary effort is normal. No respiratory distress.  Musculoskeletal:     Cervical back: Normal range of motion.     Comments: LLE No traumatic wounds, ecchymosis, or rash  Mod TTP hip  No knee or ankle effusion  Knee stable to varus/ valgus and anterior/posterior stress  Sens DPN, SPN, TN intact  Motor EHL, ext, flex, evers 5/5  DP 0, PT 0, No significant edema  Skin:    General: Skin is warm and dry.  Neurological:     Mental Status: He is alert.  Psychiatric:        Mood and Affect: Mood normal.        Behavior: Behavior normal.     Assessment/Plan: Left hip fx -- Plan THA tomorrow with Dr. Jena Gauss. Please keep NPO after MN. Multiple medical problems including chronic atrial fibrillation, heart failure with reduced ejection fraction, essential hypertension, CABG in 04/2023, CKD stage IIIb, non-insulin-dependent DM type II, and hyperlipidemia -- per primary service. Please continue to hold Eliquis    Freeman Caldron, PA-C Orthopedic Surgery [phone] 07/13/2023, 9:07 AM

## 2023-07-13 NOTE — Telephone Encounter (Signed)
 Patient called to report he fell and broke his hip and is postponing doing his blood work for his cholesterol work up.

## 2023-07-13 NOTE — Anesthesia Procedure Notes (Signed)
 Anesthesia Regional Block: Peng block   Pre-Anesthetic Checklist: , timeout performed,  Correct Patient, Correct Site, Correct Laterality,  Correct Procedure, Correct Position, site marked,  Risks and benefits discussed,  Surgical consent,  Pre-op evaluation,  At surgeon's request and post-op pain management  Laterality: Left  Prep: Maximum Sterile Barrier Precautions used, chloraprep       Needles:  Injection technique: Single-shot  Needle Type: Echogenic Stimulator Needle     Needle Length: 9cm  Needle Gauge: 22     Additional Needles:   Procedures:,,,, ultrasound used (permanent image in chart),,    Narrative:  Start time: 07/13/2023 10:00 AM End time: 07/13/2023 10:10 AM Injection made incrementally with aspirations every 5 mL.  Performed by: Personally  Anesthesiologist: Lannie Fields, DO  Additional Notes: Monitors applied. No increased pain on injection. No increased resistance to injection. Injection made in 5cc increments. Good needle visualization. Patient tolerated procedure well.

## 2023-07-13 NOTE — Progress Notes (Signed)
 Patient arrived from ED to room 5W24 via bed. Amio infusing. Initial MEW score yellow. Protocol initiated.   07/13/23 1436  Assess: MEWS Score  BP (!) 142/99  MAP (mmHg) 113  Pulse Rate (!) 104  ECG Heart Rate (!) 106  Resp (!) 23  Level of Consciousness Alert  SpO2 (!) 89 %  O2 Device Room Air  Assess: MEWS Score  MEWS Temp 0  MEWS Systolic 0  MEWS Pulse 1  MEWS RR 1  MEWS LOC 0  MEWS Score 2  MEWS Score Color Yellow  Assess: if the MEWS score is Yellow or Red  Were vital signs accurate and taken at a resting state? Yes  Does the patient meet 2 or more of the SIRS criteria? No  MEWS guidelines implemented  Yes, yellow  Treat  MEWS Interventions Considered administering scheduled or prn medications/treatments as ordered  Take Vital Signs  Increase Vital Sign Frequency  Yellow: Q2hr x1, continue Q4hrs until patient remains green for 12hrs  Escalate  MEWS: Escalate Yellow: Discuss with charge nurse and consider notifying provider and/or RRT  Assess: SIRS CRITERIA  SIRS Temperature  0  SIRS Respirations  1  SIRS Pulse 1  SIRS WBC 0  SIRS Score Sum  2

## 2023-07-13 NOTE — H&P (View-Only) (Signed)
 Reason for Consult:Left hip fx Referring Physician: Osvaldo Shipper Time called: 1478 Time at bedside: 0853   David [name] is an 63 y.o. male.  HPI: [name] was getting out of the shower and drying off when he slipped in some water and fell. He had immediate left hip pain and could not get up. He was brought to the ED where x-rays showed a left hip fx and orthopedic surgery was consulted. He lives at home with his family, uses a cane if going out of the house s/p back surgery that left him with LLE weakness, and does not work.  Past Medical History:  Diagnosis Date   Cardiomyopathy (HCC)    a. EF 45% in 2019.   CHF (congestive heart failure) (HCC)    Chronic anticoagulation 09/30/2017   Diabetes mellitus type 2 in nonobese [facility])    Discitis of thoracic region 09/25/2022   Does not have health insurance    Essential hypertension 09/26/2017   Financial difficulties    H/O noncompliance with medical treatment, presenting hazards to health    Hardware complicating wound infection (HCC) 09/25/2022   Non-insulin treated type 2 diabetes mellitus (HCC) 09/26/2017   Persistent atrial fibrillation (HCC)    Sleep apnea suspected 09/30/2017   "years ago"- not anymore per patient    Past Surgical History:  Procedure Laterality Date   ABDOMINAL AORTOGRAM W/LOWER EXTREMITY N/A 09/02/2021   Procedure: ABDOMINAL AORTOGRAM W/LOWER EXTREMITY;  Surgeon: Nada Libman, MD;  [facility];  Service: Cardiovascular;  Laterality: N/A;   ABDOMINAL AORTOGRAM W/LOWER EXTREMITY N/A 12/28/2021   Procedure: ABDOMINAL AORTOGRAM W/LOWER EXTREMITY;  Surgeon: Nada Libman, MD;  [facility];  Service: Cardiovascular;  Laterality: N/A;   ABDOMINAL AORTOGRAM W/LOWER EXTREMITY N/A 07/28/2022   Procedure: ABDOMINAL AORTOGRAM W/LOWER EXTREMITY;  Surgeon: Cephus Shelling, MD;  [facility];  Service: Cardiovascular;  Laterality: N/A;   AMPUTATION Right 09/04/2021    Procedure: 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;  Surgeon: Nadara Mustard, MD;  Location[facility] OR;  Service: Orthopedics;  Laterality: Right;   AMPUTATION Right 08/02/2022   Procedure: AMPUTATION RAY 5TH METATARSAL OF RIGHT FOOT;  Surgeon: [name], MD;  Location: [facility] OR;  Service: Orthopedics;  Laterality: Right;   APPENDECTOMY  1971   BONE BIOPSY Right 08/02/2022   Procedure: BONE BIOPSY OF 5TH METATARSAL RIGHT FOOT;  Surgeon: [name], MD;  Location: [facility];  Service: Orthopedics;  Laterality: Right;   CARDIOVERSION N/A 09/28/2017   Procedure: CARDIOVERSION;  Surgeon: Thurmon Fair, MD;  [facility];  Service: Cardiovascular;  Laterality: N/A;   CLIPPING OF ATRIAL APPENDAGE N/A 04/18/2023   Procedure: CLIPPING OF ATRIAL APPENDAGE USING ATRICURE ATRICLIP [id number] with pulmonary vein isolation ablation;  Surgeon: Alleen Borne, MD;  Location: [facility];  Service: Open Heart Surgery;  Laterality: N/A;   CORONARY ARTERY BYPASS GRAFT N/A 04/18/2023   Procedure: CORONARY ARTERY BYPASS GRAFTING (CABG) TIMES FOUR USING LEFT INTERNAL MAMMARY ARTERY AND ENDOSCOPICALLY HARVESTED LEFT GREATER SAPHENOUS VEIN;  Surgeon: Alleen Borne, MD;  [facility];  Service: Open Heart Surgery;  Laterality: N/A;   IRRIGATION AND DEBRIDEMENT FOOT  08/02/2022   Procedure: IRRIGATION AND DEBRIDEMENT RIGHT 5TH METATARSAL FOOT;  Surgeon: [name], MD;  [facility];  Service: Orthopedics;;   LAMINECTOMY WITH POSTERIOR LATERAL ARTHRODESIS LEVEL 4 N/A 08/30/2022   Procedure: THORACIC FIVE-THORACIC ELEVEN FUSION, THORACIC EIGHT TRANSPEDICULAR DECOMPRESSION AND PARTIAL CORPECTOMY with O-Arm;  Surgeon:  Bedelia Person, MD;  Location: [facility] OR;  Service: Neurosurgery;  Laterality: N/A;   LEFT HEART CATH AND CORONARY ANGIOGRAPHY N/A 04/12/2023   Procedure: LEFT HEART CATH AND CORONARY ANGIOGRAPHY;  Surgeon: Kathleene Hazel, MD;  [facility];  Service: Cardiovascular;  Laterality:  N/A;   NASAL ENDOSCOPY Right 06/30/2023   Procedure: ENDOSCOPY, NOSE;  Surgeon: Serena Colonel, MD;  Location: [facility] OR;  Service: ENT;  Laterality: Right;   PERIPHERAL VASCULAR BALLOON ANGIOPLASTY  09/02/2021   Procedure: PERIPHERAL VASCULAR BALLOON ANGIOPLASTY;  Surgeon: Nada Libman, MD;  [facility];  Service: Cardiovascular;;   PERIPHERAL VASCULAR BALLOON ANGIOPLASTY  12/28/2021   Procedure: PERIPHERAL VASCULAR BALLOON ANGIOPLASTY;  Surgeon: Nada Libman, MD;  [facility];  Service: Cardiovascular;;  Posterior Tibial PTA only   POLYPECTOMY Right 06/30/2023   Procedure: POLYPECTOMY, NASAL CAVITY;  Surgeon: Serena Colonel, MD;  Location: [facility] OR;  Service: ENT;  Laterality: Right;   TEE WITHOUT CARDIOVERSION N/A 09/28/2017   Procedure: TRANSESOPHAGEAL ECHOCARDIOGRAM (TEE);  Surgeon: Thurmon Fair, MD;  Location: [facility] ENDOSCOPY;  Service: Cardiovascular;  Laterality: N/A;   TEE WITHOUT CARDIOVERSION N/A 09/03/2021   Procedure: TRANSESOPHAGEAL ECHOCARDIOGRAM (TEE);  Surgeon: Chilton Si, MD;  Location: [facility] ENDOSCOPY;  Service: Cardiovascular;  Laterality: N/A;   TEE WITHOUT CARDIOVERSION N/A 07/15/2022   Procedure: TRANSESOPHAGEAL ECHOCARDIOGRAM;  Surgeon: Thurmon Fair, MD;  [facility];  Service: Cardiovascular;  Laterality: N/A;   TEE WITHOUT CARDIOVERSION N/A 04/18/2023   Procedure: TRANSESOPHAGEAL ECHOCARDIOGRAM (TEE);  Surgeon: Alleen Borne, MD;  Location: [facility] OR;  Service: Open Heart Surgery;  Laterality: N/A;    Family History  Problem Relation Age of Onset   Hypertension Mother     Social History:  reports that he quit smoking about 5 years ago. His smoking use included cigarettes. He started smoking about 20 years ago. He has never used smokeless tobacco. He reports that he does not currently use alcohol after a past usage of about 3.0 standard drinks of alcohol per week. He reports that he does not use drugs.  Allergies: No  Known Allergies  Medications: I have reviewed the patient's current medications.  Results for orders placed or performed during the hospital encounter of 07/12/23 (from the past 48 hours)  CBC with Differential     Status: Abnormal   Collection Time: 07/12/23  9:05 PM  Result Value Ref Range   WBC 9.0 4.0 - 10.5 K/uL   RBC 4.52 4.22 - 5.81 MIL/uL   Hemoglobin 13.6 13.0 - 17.0 g/dL   HCT 16.1 09.6 - 04.5 %   MCV 91.6 80.0 - 100.0 fL   MCH 30.1 26.0 - 34.0 pg   MCHC 32.9 30.0 - 36.0 g/dL   RDW 40.9 81.1 - 91.4 %   Platelets 215 150 - 400 K/uL   nRBC 0.0 0.0 - 0.2 %   Neutrophils Relative % 76 %   Neutro Abs 6.9 1.7 - 7.7 K/uL   Lymphocytes Relative 11 %   Lymphs Abs 1.0 0.7 - 4.0 K/uL   Monocytes Relative 9 %   Monocytes Absolute 0.8 0.1 - 1.0 K/uL   Eosinophils Relative 2 %   Eosinophils Absolute 0.2 0.0 - 0.5 K/uL   Basophils Relative 1 %   Basophils Absolute 0.1 0.0 - 0.1 K/uL   Immature Granulocytes 1 %   Abs Immature Granulocytes 0.10 (H) 0.00 - 0.07 K/uL    Comment: Performed at [facility], [address], [place], [place] [address]  Basic metabolic panel     Status: Abnormal   Collection Time: 07/12/23  9:05 PM  Result Value Ref Range   Sodium 137 135 - 145 mmol/L   Potassium 4.4 3.5 - 5.1 mmol/L   Chloride 102 98 - 111 mmol/L   CO2 23 22 - 32 mmol/L   Glucose, Bld 158 (H) 70 - 99 mg/dL    Comment: Glucose reference range applies only to samples taken after fasting for at least 8 hours.   BUN 34 (H) 8 - 23 mg/dL   Creatinine, Ser 6.04 (H) 0.61 - 1.24 mg/dL   Calcium 9.6 8.9 - 54.0 mg/dL   GFR, Estimated 45 (L) >60 mL/min    Comment: (NOTE) Calculated using the CKD-EPI Creatinine Equation (2021)    Anion gap 12 5 - 15    Comment: Performed at [facility], [address], [place], [place] [address]  CBG monitoring, ED     Status: Abnormal   Collection Time: 07/13/23 12:25 AM  Result Value Ref Range   Glucose-Capillary 152 (H) 70 - 99 mg/dL     Comment: Glucose reference range applies only to samples taken after fasting for at least 8 hours.  CBC     Status: Abnormal   Collection Time: 07/13/23  5:44 AM  Result Value Ref Range   WBC 11.4 (H) 4.0 - 10.5 K/uL   RBC 3.92 (L) 4.22 - 5.81 MIL/uL   Hemoglobin 11.8 (L) 13.0 - 17.0 g/dL   HCT 14.7 (L) 82.9 - 56.2 %   MCV 91.8 80.0 - 100.0 fL   MCH 30.1 26.0 - 34.0 pg   MCHC 32.8 30.0 - 36.0 g/dL   RDW 13.0 86.5 - 78.4 %   Platelets 178 150 - 400 K/uL   nRBC 0.0 0.0 - 0.2 %    Comment: Performed at [facility] Lab, [address], [place], [place] [address]  Protime-INR     Status: Abnormal   Collection Time: 07/13/23  5:44 AM  Result Value Ref Range   Prothrombin Time 15.7 (H) 11.4 - 15.2 seconds   INR 1.2 0.8 - 1.2    Comment: (NOTE) INR goal varies based on device and disease states. Performed at [facility], [address], [place], [place] [address]   APTT     Status: None   Collection Time: 07/13/23  5:44 AM  Result Value Ref Range   aPTT 33 24 - 36 seconds    Comment: Performed at [facility], [address] [place], [place] [address]  Comprehensive metabolic panel     Status: Abnormal   Collection Time: 07/13/23  5:44 AM  Result Value Ref Range   Sodium 133 (L) 135 - 145 mmol/L   Potassium 3.9 3.5 - 5.1 mmol/L   Chloride 101 98 - 111 mmol/L   CO2 22 22 - 32 mmol/L   Glucose, Bld 187 (H) 70 - 99 mg/dL    Comment: Glucose reference range applies only to samples taken after fasting for at least 8 hours.   BUN 33 (H) 8 - 23 mg/dL   Creatinine, Ser 1.02 (H) 0.61 - 1.24 mg/dL   Calcium 8.8 (L) 8.9 - 10.3 mg/dL   Total Protein 6.3 (L) 6.5 - 8.1 g/dL   Albumin 3.1 (L) 3.5 - 5.0 g/dL   AST 20 15 - 41 U/L   ALT 16 0 - 44 U/L  Alkaline Phosphatase 70 38 - 126 U/L   Total Bilirubin 0.7 0.0 - 1.2 mg/dL   GFR, Estimated 43 (L) >60 mL/min    Comment: (NOTE) Calculated using the CKD-EPI Creatinine Equation (2021)    Anion gap 10 5 - 15    Comment:  Performed at [facility] Lab, [address] [place], [place] [address]  Magnesium     Status: None   Collection Time: 07/13/23  5:44 AM  Result Value Ref Range   Magnesium 1.8 1.7 - 2.4 mg/dL    Comment: Performed at [facility] Lab, [address], [place], [place] [address]  CBG monitoring, ED     Status: Abnormal   Collection Time: 07/13/23  8:00 AM  Result Value Ref Range   Glucose-Capillary 158 (H) 70 - 99 mg/dL    Comment: Glucose reference range applies only to samples taken after fasting for at least 8 hours.    CT HEAD WO CONTRAST ( ) Result Date: 07/13/2023 CLINICAL DATA:  Trauma fell getting out of shower EXAM: CT HEAD WITHOUT CONTRAST TECHNIQUE: Contiguous axial images were obtained from the base of the skull through the vertex without intravenous contrast. RADIATION DOSE REDUCTION: This exam was performed according to the departmental dose-optimization program which includes automated exposure control, adjustment of the mA and/or kV according to patient size and/or use of iterative reconstruction technique. COMPARISON:  CT brain 02/20/2023 FINDINGS: Brain: No acute territorial infarction, hemorrhage or intracranial mass. Mild atrophy. Mild chronic small vessel ischemic changes of the white matter. The ventricles are nonenlarged Vascular: No hyperdense vessels.  Carotid vascular calcification Skull: Normal. Negative for fracture or focal lesion. Sinuses/Orbits: Moderate mucosal thickening in the sinuses. Chronic appearing nasal bone deformity Other: None IMPRESSION: 1. No CT evidence for acute intracranial abnormality. 2. Mild atrophy and chronic small vessel ischemic changes of the white matter. 3. Moderate mucosal thickening in the sinuses. Electronically Signed   By: Jasmine Pang M.D.   On: 07/13/2023 00:02   DG Hip Unilat W or Wo Pelvis 2-3 Views Left Result Date: 07/12/2023 CLINICAL DATA:  Left hip pain after fall. EXAM: DG HIP (WITH OR WITHOUT PELVIS) 2-3V LEFT  COMPARISON:  None Available. FINDINGS: Displaced left femoral neck fracture. Femoral head is well seated in the acetabulum. No hip dislocation. Bony pelvis and pubic rami are intact. Pubic symphysis and sacroiliac joints are congruent. IMPRESSION: Displaced left femoral neck fracture. Electronically Signed   By: Narda Rutherford M.D.   On: 07/12/2023 23:37   DG Chest 1 View Result Date: 07/12/2023 CLINICAL DATA:  Fall getting out of the shower. EXAM: CHEST  1 VIEW COMPARISON:  Chest radiograph 05/24/2023 FINDINGS: Prior median sternotomy with left atrial clipping. The heart is enlarged. Stable mediastinal contours. Lower lung volumes from prior exam. Similar vascular congestion. No pneumothorax or large pleural effusion. On limited assessment, no acute osseous findings. IMPRESSION: Stable cardiomegaly with vascular congestion. Electronically Signed   By: Narda Rutherford M.D.   On: 07/12/2023 23:37    Review of Systems  HENT:  Negative for ear discharge, ear pain, hearing loss and tinnitus.   Eyes:  Negative for photophobia and pain.  Respiratory:  Negative for cough and shortness of breath.   Cardiovascular:  Negative for chest pain.  Gastrointestinal:  Negative for abdominal pain, nausea and vomiting.  Genitourinary:  Negative for dysuria, flank pain, frequency and urgency.  Musculoskeletal:  Positive for arthralgias (Left hip). Negative for back pain, myalgias and neck pain.  Neurological:  Negative for dizziness and headaches.  Hematological:  Does not bruise/bleed easily.  Psychiatric/Behavioral:  The patient is not nervous/anxious.    Blood pressure 98/82, pulse 64, temperature 97.9 F (36.6 C), temperature source Oral, resp. rate 19, height 6' (1.829 m), weight 81.6 kg, SpO2 95%. Physical Exam Constitutional:      General: He is not in acute distress.    Appearance: He is well-developed. He is not diaphoretic.  HENT:     Head: Normocephalic and atraumatic.  Eyes:     General: No  scleral icterus.       Right eye: No discharge.        Left eye: No discharge.     Conjunctiva/sclera: Conjunctivae normal.  Cardiovascular:     Rate and Rhythm: Normal rate and regular rhythm.  Pulmonary:     Effort: Pulmonary effort is normal. No respiratory distress.  Musculoskeletal:     Cervical back: Normal range of motion.     Comments: LLE No traumatic wounds, ecchymosis, or rash  Mod TTP hip  No knee or ankle effusion  Knee stable to varus/ valgus and anterior/posterior stress  Sens DPN, SPN, TN intact  Motor EHL, ext, flex, evers 5/5  DP 0, PT 0, No significant edema  Skin:    General: Skin is warm and dry.  Neurological:     Mental Status: He is alert.  Psychiatric:        Mood and Affect: Mood normal.        Behavior: Behavior normal.     Assessment/Plan: Left hip fx -- Plan THA tomorrow with Dr. Jena Gauss. Please keep NPO after MN. Multiple medical problems including chronic atrial fibrillation, heart failure with reduced ejection fraction, essential hypertension, CABG in 04/2023, CKD stage IIIb, non-insulin-dependent DM type II, and hyperlipidemia -- per primary service. Please continue to hold Eliquis    Freeman Caldron, PA-C Orthopedic Surgery [phone] 07/13/2023, 9:07 AM

## 2023-07-13 NOTE — ED Notes (Signed)
 Reached out to provider about pt HR 120's-150's and BP in the 170's. PT is complaining of pain, provider poc is to give pain meds and monitor for post pain meds.

## 2023-07-13 NOTE — Progress Notes (Signed)
 Surgical consent signed, place in chart. Nasal swab PCR collected and sent to lab.

## 2023-07-13 NOTE — Anesthesia Preprocedure Evaluation (Signed)
 Anesthesia Evaluation  Patient identified by MRN, date of birth, ID band Patient awake    Reviewed: Allergy & Precautions, Patient's Chart, lab work & pertinent test results  Airway Mallampati: II  TM Distance: >3 FB Neck ROM: Full    Dental no notable dental hx.    Pulmonary neg pulmonary ROS, former smoker   Pulmonary exam normal breath sounds clear to auscultation       Cardiovascular hypertension, + CAD, + Past MI, + CABG and + Peripheral Vascular Disease  Normal cardiovascular exam Rhythm:Regular Rate:Normal     Neuro/Psych  PSYCHIATRIC DISORDERS Anxiety     negative neurological ROS     GI/Hepatic negative GI ROS, Neg liver ROS,,,  Endo/Other  diabetes    Renal/GU negative Renal ROS  negative genitourinary   Musculoskeletal  (+) Arthritis , Osteoarthritis,  L hip fx   Abdominal   Peds  Hematology negative hematology ROS (+)   Anesthesia Other Findings   Reproductive/Obstetrics negative OB ROS                             Anesthesia Physical Anesthesia Plan  ASA: 3  Anesthesia Plan: Regional   Post-op Pain Management:    Induction:   PONV Risk Score and Plan: 2 and Treatment may vary due to age or medical condition  Airway Management Planned: Natural Airway  Additional Equipment: None  Intra-op Plan:   Post-operative Plan:   Informed Consent: I have reviewed the patients History and Physical, chart, labs and discussed the procedure including the risks, benefits and alternatives for the proposed anesthesia with the patient or authorized representative who has indicated his/her understanding and acceptance.       Plan Discussed with:   Anesthesia Plan Comments: (L hip fx )       Anesthesia Quick Evaluation

## 2023-07-13 NOTE — ED Notes (Signed)
 Pt HR is still in the 150's, BP has come down due to pain medications. PRN BP meds are not given. Nurse did a EKG for provider to look at.

## 2023-07-14 ENCOUNTER — Encounter (HOSPITAL_COMMUNITY)
Admission: EM | Disposition: A | Discharge status: Skilled Nursing Facility | Payer: Self-pay | Source: Home / Self Care | Attending: Internal Medicine

## 2023-07-14 ENCOUNTER — Other Ambulatory Visit: Payer: Self-pay

## 2023-07-14 ENCOUNTER — Inpatient Hospital Stay (HOSPITAL_COMMUNITY): Admitting: Anesthesiology

## 2023-07-14 ENCOUNTER — Inpatient Hospital Stay (HOSPITAL_COMMUNITY)

## 2023-07-14 ENCOUNTER — Encounter (HOSPITAL_COMMUNITY): Payer: Self-pay | Admitting: Internal Medicine

## 2023-07-14 DIAGNOSIS — S7292XA Unspecified fracture of left femur, initial encounter for closed fracture: Secondary | ICD-10-CM | POA: Diagnosis not present

## 2023-07-14 DIAGNOSIS — G4733 Obstructive sleep apnea (adult) (pediatric): Secondary | ICD-10-CM

## 2023-07-14 DIAGNOSIS — I4821 Permanent atrial fibrillation: Secondary | ICD-10-CM | POA: Diagnosis not present

## 2023-07-14 DIAGNOSIS — S72002A Fracture of unspecified part of neck of left femur, initial encounter for closed fracture: Secondary | ICD-10-CM | POA: Diagnosis not present

## 2023-07-14 DIAGNOSIS — E785 Hyperlipidemia, unspecified: Secondary | ICD-10-CM | POA: Diagnosis not present

## 2023-07-14 HISTORY — PX: TOTAL HIP ARTHROPLASTY: SHX124

## 2023-07-14 LAB — GLUCOSE, CAPILLARY
Glucose-Capillary: 188 mg/dL — ABNORMAL HIGH (ref 70–99)
Glucose-Capillary: 195 mg/dL — ABNORMAL HIGH (ref 70–99)
Glucose-Capillary: 231 mg/dL — ABNORMAL HIGH (ref 70–99)
Glucose-Capillary: 234 mg/dL — ABNORMAL HIGH (ref 70–99)
Glucose-Capillary: 235 mg/dL — ABNORMAL HIGH (ref 70–99)
Glucose-Capillary: 265 mg/dL — ABNORMAL HIGH (ref 70–99)
Glucose-Capillary: 284 mg/dL — ABNORMAL HIGH (ref 70–99)
Glucose-Capillary: 361 mg/dL — ABNORMAL HIGH (ref 70–99)

## 2023-07-14 LAB — POCT I-STAT, CHEM 8
BUN: 34 mg/dL — ABNORMAL HIGH (ref 8–23)
Calcium, Ion: 1.28 mmol/L (ref 1.15–1.40)
Chloride: 103 mmol/L (ref 98–111)
Creatinine, Ser: 1.7 mg/dL — ABNORMAL HIGH (ref 0.61–1.24)
Glucose, Bld: 193 mg/dL — ABNORMAL HIGH (ref 70–99)
HCT: 37 % — ABNORMAL LOW (ref 39.0–52.0)
Hemoglobin: 12.6 g/dL — ABNORMAL LOW (ref 13.0–17.0)
Potassium: 4.4 mmol/L (ref 3.5–5.1)
Sodium: 133 mmol/L — ABNORMAL LOW (ref 135–145)
TCO2: 21 mmol/L — ABNORMAL LOW (ref 22–32)

## 2023-07-14 LAB — CBC
HCT: 36.5 % — ABNORMAL LOW (ref 39.0–52.0)
Hemoglobin: 12.6 g/dL — ABNORMAL LOW (ref 13.0–17.0)
MCH: 30.1 pg (ref 26.0–34.0)
MCHC: 34.5 g/dL (ref 30.0–36.0)
MCV: 87.3 fL (ref 80.0–100.0)
Platelets: 172 10*3/uL (ref 150–400)
RBC: 4.18 MIL/uL — ABNORMAL LOW (ref 4.22–5.81)
RDW: 13.5 % (ref 11.5–15.5)
WBC: 14 10*3/uL — ABNORMAL HIGH (ref 4.0–10.5)
nRBC: 0 % (ref 0.0–0.2)

## 2023-07-14 LAB — BASIC METABOLIC PANEL WITH GFR
Anion gap: 11 (ref 5–15)
BUN: 34 mg/dL — ABNORMAL HIGH (ref 8–23)
CO2: 19 mmol/L — ABNORMAL LOW (ref 22–32)
Calcium: 9.3 mg/dL (ref 8.9–10.3)
Chloride: 102 mmol/L (ref 98–111)
Creatinine, Ser: 1.74 mg/dL — ABNORMAL HIGH (ref 0.61–1.24)
GFR, Estimated: 44 mL/min — ABNORMAL LOW (ref >60)
Glucose, Bld: 256 mg/dL — ABNORMAL HIGH (ref 70–99)
Potassium: 4.5 mmol/L (ref 3.5–5.1)
Sodium: 132 mmol/L — ABNORMAL LOW (ref 135–145)

## 2023-07-14 SURGERY — ARTHROPLASTY, HIP, TOTAL, ANTERIOR APPROACH
Anesthesia: General | Site: Hip | Laterality: Left

## 2023-07-14 MED ORDER — CARVEDILOL 6.25 MG PO TABS
6.2500 mg | ORAL_TABLET | Freq: Two times a day (BID) | ORAL | Status: DC
  Filled 2023-07-14: qty 1

## 2023-07-14 MED ORDER — ONDANSETRON HCL 4 MG/2ML IJ SOLN
INTRAMUSCULAR | Status: AC
Start: 2023-07-14 — End: ?
  Filled 2023-07-14: qty 2

## 2023-07-14 MED ORDER — ROCURONIUM BROMIDE 10 MG/ML (PF) SYRINGE
PREFILLED_SYRINGE | INTRAVENOUS | Status: AC
  Filled 2023-07-14: qty 10

## 2023-07-14 MED ORDER — METOCLOPRAMIDE HCL 5 MG/ML IJ SOLN: 5.0000 mg | Freq: Three times a day (TID) | INTRAMUSCULAR | Status: DC | PRN

## 2023-07-14 MED ORDER — FENTANYL CITRATE (PF) 250 MCG/5ML IJ SOLN
INTRAMUSCULAR | Status: DC | PRN
Start: 2023-07-14 — End: 2023-07-14
  Administered 2023-07-14: 50 ug via INTRAVENOUS
  Administered 2023-07-14: 25 ug via INTRAVENOUS
  Administered 2023-07-14: 100 ug via INTRAVENOUS

## 2023-07-14 MED ORDER — SUGAMMADEX SODIUM 200 MG/2ML IV SOLN
INTRAVENOUS | Status: DC | PRN
  Administered 2023-07-14: 200 mg via INTRAVENOUS

## 2023-07-14 MED ORDER — SODIUM CHLORIDE 0.9 % IR SOLN
Status: DC | PRN
  Administered 2023-07-14: 1000 mL

## 2023-07-14 MED ORDER — INSULIN ASPART 100 UNIT/ML IJ SOLN
4.0000 [IU] | Freq: Once | INTRAMUSCULAR | Status: AC
  Administered 2023-07-14: 4 [IU] via SUBCUTANEOUS

## 2023-07-14 MED ORDER — INSULIN ASPART 100 UNIT/ML IJ SOLN
0.0000 [IU] | INTRAMUSCULAR | Status: DC
  Administered 2023-07-14: 15 [IU] via SUBCUTANEOUS
  Administered 2023-07-14: 3 [IU] via SUBCUTANEOUS
  Administered 2023-07-14: 8 [IU] via SUBCUTANEOUS
  Administered 2023-07-15: 3 [IU] via SUBCUTANEOUS
  Administered 2023-07-15 (×3): 5 [IU] via SUBCUTANEOUS
  Administered 2023-07-15 – 2023-07-16 (×3): 3 [IU] via SUBCUTANEOUS
  Administered 2023-07-16: 2 [IU] via SUBCUTANEOUS
  Administered 2023-07-16: 3 [IU] via SUBCUTANEOUS
  Administered 2023-07-16: 5 [IU] via SUBCUTANEOUS
  Administered 2023-07-16 – 2023-07-17 (×4): 3 [IU] via SUBCUTANEOUS
  Administered 2023-07-17: 2 [IU] via SUBCUTANEOUS
  Administered 2023-07-17: 5 [IU] via SUBCUTANEOUS
  Administered 2023-07-17 (×2): 2 [IU] via SUBCUTANEOUS
  Administered 2023-07-18: 5 [IU] via SUBCUTANEOUS
  Administered 2023-07-18 (×2): 3 [IU] via SUBCUTANEOUS

## 2023-07-14 MED ORDER — ACETAMINOPHEN 650 MG RE SUPP
650.0000 mg | Freq: Four times a day (QID) | RECTAL | Status: DC
Start: 2023-07-14 — End: 2023-07-15

## 2023-07-14 MED ORDER — PROPOFOL 10 MG/ML IV BOLUS
INTRAVENOUS | Status: AC
  Filled 2023-07-14: qty 20

## 2023-07-14 MED ORDER — FENTANYL CITRATE (PF) 100 MCG/2ML IJ SOLN
INTRAMUSCULAR | Status: AC
  Filled 2023-07-14: qty 2

## 2023-07-14 MED ORDER — CARVEDILOL 3.125 MG PO TABS
3.1250 mg | ORAL_TABLET | Freq: Once | ORAL | Status: AC
  Administered 2023-07-14: 3.125 mg via ORAL
  Filled 2023-07-14: qty 1

## 2023-07-14 MED ORDER — ONDANSETRON HCL 4 MG/2ML IJ SOLN: 4.0000 mg | Freq: Four times a day (QID) | INTRAMUSCULAR | Status: DC | PRN

## 2023-07-14 MED ORDER — PHENYLEPHRINE 80 MCG/ML (10ML) SYRINGE FOR IV PUSH (FOR BLOOD PRESSURE SUPPORT)
PREFILLED_SYRINGE | INTRAVENOUS | Status: AC
  Filled 2023-07-14: qty 10

## 2023-07-14 MED ORDER — CHLORHEXIDINE GLUCONATE 0.12 % MT SOLN
OROMUCOSAL | Status: AC
  Administered 2023-07-14: 15 mL
  Filled 2023-07-14: qty 15

## 2023-07-14 MED ORDER — DOCUSATE SODIUM 100 MG PO CAPS
100.0000 mg | ORAL_CAPSULE | Freq: Two times a day (BID) | ORAL | Status: DC
  Administered 2023-07-14 – 2023-07-16 (×5): 100 mg via ORAL
  Filled 2023-07-14 (×5): qty 1

## 2023-07-14 MED ORDER — MIDAZOLAM HCL 2 MG/2ML IJ SOLN
INTRAMUSCULAR | Status: DC | PRN
Start: 2023-07-14 — End: 2023-07-14
  Administered 2023-07-14: 2 mg via INTRAVENOUS

## 2023-07-14 MED ORDER — LIDOCAINE 2% (20 MG/ML) 5 ML SYRINGE
INTRAMUSCULAR | Status: AC
  Filled 2023-07-14: qty 5

## 2023-07-14 MED ORDER — HEPARIN SODIUM (PORCINE) 5000 UNIT/ML IJ SOLN
5000.0000 [IU] | Freq: Three times a day (TID) | INTRAMUSCULAR | Status: DC
  Administered 2023-07-14 – 2023-07-17 (×8): 5000 [IU] via SUBCUTANEOUS
  Filled 2023-07-14 (×8): qty 1

## 2023-07-14 MED ORDER — MIDAZOLAM HCL 2 MG/2ML IJ SOLN
INTRAMUSCULAR | Status: AC
  Filled 2023-07-14: qty 2

## 2023-07-14 MED ORDER — OXYCODONE HCL 5 MG PO TABS: 5.0000 mg | ORAL_TABLET | Freq: Once | ORAL | Status: DC | PRN

## 2023-07-14 MED ORDER — TRANEXAMIC ACID-NACL 1000-0.7 MG/100ML-% IV SOLN
1000.0000 mg | Freq: Once | INTRAVENOUS | Status: DC
  Filled 2023-07-14: qty 100

## 2023-07-14 MED ORDER — PHENOL 1.4 % MT LIQD: 1.0000 | OROMUCOSAL | Status: DC | PRN

## 2023-07-14 MED ORDER — ONDANSETRON HCL 4 MG/2ML IJ SOLN
INTRAMUSCULAR | Status: DC | PRN
  Administered 2023-07-14: 4 mg via INTRAVENOUS

## 2023-07-14 MED ORDER — PHENYLEPHRINE HCL-NACL 20-0.9 MG/250ML-% IV SOLN
INTRAVENOUS | Status: DC | PRN
  Administered 2023-07-14: 50 ug/min via INTRAVENOUS

## 2023-07-14 MED ORDER — 0.9 % SODIUM CHLORIDE (POUR BTL) OPTIME
TOPICAL | Status: DC | PRN
  Administered 2023-07-14: 1000 mL

## 2023-07-14 MED ORDER — VANCOMYCIN HCL 1000 MG IV SOLR
INTRAVENOUS | Status: AC
  Filled 2023-07-14: qty 20

## 2023-07-14 MED ORDER — HYDROMORPHONE HCL 1 MG/ML IJ SOLN
0.5000 mg | INTRAMUSCULAR | Status: DC | PRN
  Administered 2023-07-14 – 2023-07-15 (×4): 1 mg via INTRAVENOUS
  Filled 2023-07-14 (×5): qty 1

## 2023-07-14 MED ORDER — FENTANYL CITRATE (PF) 100 MCG/2ML IJ SOLN
25.0000 ug | INTRAMUSCULAR | Status: DC | PRN
  Administered 2023-07-14 (×3): 50 ug via INTRAVENOUS

## 2023-07-14 MED ORDER — PROPOFOL 10 MG/ML IV BOLUS
INTRAVENOUS | Status: DC | PRN
  Administered 2023-07-14: 150 mg via INTRAVENOUS

## 2023-07-14 MED ORDER — METOCLOPRAMIDE HCL 10 MG PO TABS: 5.0000 mg | ORAL_TABLET | Freq: Three times a day (TID) | ORAL | Status: DC | PRN

## 2023-07-14 MED ORDER — CEFAZOLIN SODIUM-DEXTROSE 2-4 GM/100ML-% IV SOLN
2.0000 g | Freq: Three times a day (TID) | INTRAVENOUS | Status: AC
  Administered 2023-07-14 – 2023-07-15 (×3): 2 g via INTRAVENOUS
  Filled 2023-07-14 (×3): qty 100

## 2023-07-14 MED ORDER — EPHEDRINE 5 MG/ML INJ
INTRAVENOUS | Status: AC
  Filled 2023-07-14: qty 5

## 2023-07-14 MED ORDER — ACETAMINOPHEN 325 MG PO TABS
650.0000 mg | ORAL_TABLET | Freq: Four times a day (QID) | ORAL | Status: DC
  Administered 2023-07-14 – 2023-07-15 (×4): 650 mg via ORAL
  Filled 2023-07-14 (×4): qty 2

## 2023-07-14 MED ORDER — LIDOCAINE 2% (20 MG/ML) 5 ML SYRINGE
INTRAMUSCULAR | Status: DC | PRN
  Administered 2023-07-14: 40 mg via INTRAVENOUS

## 2023-07-14 MED ORDER — DEXAMETHASONE SODIUM PHOSPHATE 10 MG/ML IJ SOLN
INTRAMUSCULAR | Status: AC
  Filled 2023-07-14: qty 1

## 2023-07-14 MED ORDER — FUROSEMIDE 10 MG/ML IJ SOLN
20.0000 mg | Freq: Once | INTRAMUSCULAR | Status: AC
  Administered 2023-07-14: 20 mg via INTRAVENOUS
  Filled 2023-07-14: qty 2

## 2023-07-14 MED ORDER — TRANEXAMIC ACID-NACL 1000-0.7 MG/100ML-% IV SOLN
1000.0000 mg | Freq: Once | INTRAVENOUS | Status: AC
  Administered 2023-07-14: 1000 mg via INTRAVENOUS
  Filled 2023-07-14: qty 100

## 2023-07-14 MED ORDER — ROCURONIUM BROMIDE 10 MG/ML (PF) SYRINGE
PREFILLED_SYRINGE | INTRAVENOUS | Status: DC | PRN
  Administered 2023-07-14: 10 mg via INTRAVENOUS
  Administered 2023-07-14: 50 mg via INTRAVENOUS

## 2023-07-14 MED ORDER — CEFAZOLIN SODIUM-DEXTROSE 2-4 GM/100ML-% IV SOLN
INTRAVENOUS | Status: AC
  Filled 2023-07-14: qty 100

## 2023-07-14 MED ORDER — OXYCODONE HCL 5 MG/5ML PO SOLN: 5.0000 mg | Freq: Once | ORAL | Status: DC | PRN

## 2023-07-14 MED ORDER — INSULIN ASPART 100 UNIT/ML IJ SOLN
INTRAMUSCULAR | Status: DC | PRN
  Administered 2023-07-14: 4 [IU] via SUBCUTANEOUS

## 2023-07-14 MED ORDER — VANCOMYCIN HCL 1 G IV SOLR
INTRAVENOUS | Status: DC | PRN
  Administered 2023-07-14: 1000 mg via TOPICAL

## 2023-07-14 MED ORDER — FENTANYL CITRATE (PF) 250 MCG/5ML IJ SOLN
INTRAMUSCULAR | Status: AC
  Filled 2023-07-14: qty 5

## 2023-07-14 MED ORDER — DEXAMETHASONE SODIUM PHOSPHATE 10 MG/ML IJ SOLN
INTRAMUSCULAR | Status: DC | PRN
Start: 2023-07-14 — End: 2023-07-14
  Administered 2023-07-14: 5 mg via INTRAVENOUS

## 2023-07-14 MED ORDER — LACTATED RINGERS IV SOLN: INTRAVENOUS | Status: DC | PRN

## 2023-07-14 MED ORDER — REPATHA SURECLICK 140 MG/ML ~~LOC~~ SOAJ
140.0000 mg | SUBCUTANEOUS | 3 refills | Status: AC
Start: 2023-07-14 — End: ?
  Filled 2023-10-02: qty 6, 84d supply, fill #0
  Filled 2023-11-30 – 2023-12-05 (×2): qty 6, 84d supply, fill #1
  Filled 2024-02-27: qty 6, 84d supply, fill #2

## 2023-07-14 MED ORDER — MENTHOL 3 MG MT LOZG: 1.0000 | LOZENGE | OROMUCOSAL | Status: DC | PRN

## 2023-07-14 SURGICAL SUPPLY — 52 items
BAG COUNTER SPONGE SURGICOUNT (BAG) ×2 IMPLANT
BAG DECANTER FOR FLEXI CONT (MISCELLANEOUS) ×2 IMPLANT
BLADE SAG 18X100X1.27 (BLADE) ×2 IMPLANT
COVER PERINEAL POST (MISCELLANEOUS) ×2 IMPLANT
COVER SURGICAL LIGHT HANDLE (MISCELLANEOUS) ×2 IMPLANT
DERMABOND ADVANCED .7 DNX12 (GAUZE/BANDAGES/DRESSINGS) IMPLANT
DRAPE C-ARM 42X72 X-RAY (DRAPES) ×2 IMPLANT
DRAPE POUCH INSTRU U-SHP 10X18 (DRAPES) ×2 IMPLANT
DRAPE STERI IOBAN 125X83 (DRAPES) ×2 IMPLANT
DRAPE U-SHAPE 47X51 STRL (DRAPES) ×4 IMPLANT
DRSG AQUACEL AG ADV 3.5X10 (GAUZE/BANDAGES/DRESSINGS) ×2 IMPLANT
DURAPREP 26ML APPLICATOR (WOUND CARE) ×4 IMPLANT
ELECT BLADE 4.0 EZ CLEAN MEGAD (MISCELLANEOUS) ×1 IMPLANT
ELECT REM PT RETURN 9FT ADLT (ELECTROSURGICAL) ×1 IMPLANT
ELECTRODE BLDE 4.0 EZ CLN MEGD (MISCELLANEOUS) ×2 IMPLANT
ELECTRODE REM PT RTRN 9FT ADLT (ELECTROSURGICAL) ×2 IMPLANT
GLOVE BIOGEL PI IND STRL 7.0 (GLOVE) ×4 IMPLANT
GLOVE BIOGEL PI IND STRL 7.5 (GLOVE) ×10 IMPLANT
GLOVE ECLIPSE 7.0 STRL STRAW (GLOVE) ×4 IMPLANT
GLOVE SKINSENSE STRL SZ7.5 (GLOVE) ×2 IMPLANT
GLOVE SURG SYN 7.5 E (GLOVE) ×2 IMPLANT
GLOVE SURG SYN 7.5 PF PI (GLOVE) ×4 IMPLANT
GLOVE SURG UNDER POLY LF SZ7 (GLOVE) ×6 IMPLANT
GLOVE SURG UNDER POLY LF SZ7.5 (GLOVE) ×4 IMPLANT
GOWN STRL REUS W/ TWL LRG LVL3 (GOWN DISPOSABLE) IMPLANT
GOWN STRL REUS W/ TWL XL LVL3 (GOWN DISPOSABLE) ×2 IMPLANT
GOWN STRL SURGICAL XL XLNG (GOWN DISPOSABLE) ×2 IMPLANT
GOWN TOGA ZIPPER T7+ PEEL AWAY (MISCELLANEOUS) ×2 IMPLANT
HEAD CERAMIC BIOLOX 36 T1 STD (Head) IMPLANT
HOOD PEEL AWAY T7 (MISCELLANEOUS) ×2 IMPLANT
IV NS IRRIG 3000ML ARTHROMATIC (IV SOLUTION) ×2 IMPLANT
KIT BASIN OR (CUSTOM PROCEDURE TRAY) ×2 IMPLANT
LINER ACETAB NN G7 F 36 (Liner) IMPLANT
MARKER SKIN DUAL TIP RULER LAB (MISCELLANEOUS) ×2 IMPLANT
NDL SPNL 18GX3.5 QUINCKE PK (NEEDLE) ×2 IMPLANT
NEEDLE SPNL 18GX3.5 QUINCKE PK (NEEDLE) IMPLANT
PACK TOTAL JOINT (CUSTOM PROCEDURE TRAY) ×2 IMPLANT
PACK UNIVERSAL I (CUSTOM PROCEDURE TRAY) ×2 IMPLANT
SET HNDPC FAN SPRY TIP SCT (DISPOSABLE) ×2 IMPLANT
SHELL ACET G7 4H 56 SZF (Shell) IMPLANT
SOLUTION PRONTOSAN WOUND 350ML (IRRIGATION / IRRIGATOR) ×2 IMPLANT
STEM FEM CMTLS HIP 14X148 123D (Stem) IMPLANT
SUT ETHIBOND 2 V 37 (SUTURE) ×2 IMPLANT
SUT VIC AB 0 CT1 27XBRD ANBCTR (SUTURE) ×2 IMPLANT
SUT VIC AB 1 CTX36XBRD ANBCTR (SUTURE) ×2 IMPLANT
SUT VIC AB 2-0 CT1 TAPERPNT 27 (SUTURE) ×4 IMPLANT
SYR 50ML LL SCALE MARK (SYRINGE) ×2 IMPLANT
TOWEL GREEN STERILE (TOWEL DISPOSABLE) ×2 IMPLANT
TRAY CATH INTERMITTENT SS 16FR (CATHETERS) IMPLANT
TRAY FOLEY W/BAG SLVR 16FR ST (SET/KITS/TRAYS/PACK) IMPLANT
TUBE SUCT ARGYLE STRL (TUBING) ×2 IMPLANT
YANKAUER SUCT BULB TIP NO VENT (SUCTIONS) ×2 IMPLANT

## 2023-07-14 NOTE — Progress Notes (Signed)
 Notified Dr. Thedore Mins Pt. CBG: 235.

## 2023-07-14 NOTE — Anesthesia Postprocedure Evaluation (Signed)
 Anesthesia Post Note  Patient: David [name]  Procedure(s) Performed: ARTHROPLASTY, HIP, TOTAL, ANTERIOR APPROACH (Left: Hip)     Patient location during evaluation: PACU Anesthesia Type: General Level of consciousness: awake and alert Pain management: pain level controlled Vital Signs Assessment: post-procedure vital signs reviewed and stable Respiratory status: spontaneous breathing, nonlabored ventilation, respiratory function stable and patient connected to nasal cannula oxygen Cardiovascular status: blood pressure returned to baseline and stable Postop Assessment: no apparent nausea or vomiting Anesthetic complications: no   No notable events documented.  Last Vitals:  Vitals:   07/14/23 1445 07/14/23 1500  BP: 133/79 105/81  Pulse: 81 90  Resp: 18 18  Temp:  36.9 C  SpO2: 95% 92%    Last Pain:  Vitals:   07/14/23 1500  TempSrc:   PainSc: 2                  [name] S

## 2023-07-14 NOTE — Anesthesia Procedure Notes (Signed)
 Procedure Name: Intubation Date/Time: 07/14/2023 12:04 PM  Performed by: Little Ishikawa, CRNAPre-anesthesia Checklist: Patient identified, Emergency Drugs available, Suction available, Timeout performed and Patient being monitored Patient Re-evaluated:Patient Re-evaluated prior to induction Oxygen Delivery Method: Circle system utilized Preoxygenation: Pre-oxygenation with 100% oxygen Induction Type: IV induction Ventilation: Mask ventilation without difficulty Laryngoscope Size: Mac and 4 Grade View: Grade I Tube type: Oral Tube size: 7.5 mm Number of attempts: 1 Airway Equipment and Method: Stylet Placement Confirmation: ETT inserted through vocal cords under direct vision, positive ETCO2, CO2 detector and breath sounds checked- equal and bilateral Secured at: 22 cm Tube secured with: Tape Dental Injury: Teeth and Oropharynx as per pre-operative assessment

## 2023-07-14 NOTE — Progress Notes (Signed)
 [facility] PROGRESS NOTE   David [name] JYN:[id number] DOB: 1960-09-19 DOA: 07/12/2023  PCP: [name], MD  Brief History: 63 y.o. male with medical history significant of chronic atrial fibrillation, heart failure with reduced ejection fraction, essential hypertension, CABG in 04/2023, CKD stage IIIb, non-insulin-dependent DM type II, and hyperlipidemia, spinal surgery for infection last year who had a mechanical fall at home resulting in pain in the left hip area.  Found to have left hip fracture.  Hospitalized for further management.   Consultants: Cardiology.  Orthopedics  Procedures: None yet  Subjective/Interval History:  Patient in bed, appears comfortable, denies any headache, no fever, no chest pain or pressure, no shortness of breath , no abdominal pain. No new focal weakness.   Assessment/Plan:  Left hip fracture This was secondary to mechanical fall. Orthopedics has been consulted, preop clearance provided by cardiology, Eliquis is on hold resume once cleared by orthopedic surgery Surgery due on 07/14/2023, patient agreeable for any perioperative blood transfusion, will request the surgery team to address postop wound care, weightbearing, resumption of Eliquis, pain control and follow-up appointment.  Coronary artery disease status post CABG in January 2025 Apparently patient has not been taking aspirin for unknown reasons.  Eliquis currently on hold. Continue carvedilol as tolerated by blood pressure and heart rate and statin.   Appreciate cardiology input.  Permanent atrial fibrillation Continue carvedilol, was in RVR hence was placed on amiodarone, cardiology on board, Eliquis on hold as above.  History of back surgery.  Supportive care.  Chronically on narcotics.    Chronic systolic CHF with recovered EF Echocardiogram from January showed LVEF of 65 to 70%.  No evidence for volume overload currently.  Continue beta-blocker as tolerated by blood pressure  and heart rate.  Also noted to be on furosemide prior to admission which is currently on hold.  Chronic kidney disease stage IIIb Renal function close to baseline.  Monitor urine output.  Avoid nephrotoxic agents.  Hyperlipidemia Continue statin.  Peripheral neuropathy Continue Lyrica.  History of BPH Continue Flomax  Insulin-dependent diabetes mellitus type 2 HbA1c 7.4 in January.  On basal insulin at home.  Continue glargine and SSI.,  Sliding scale changed to every 4 while NPO.  Lab Results  Component Value Date   HGBA1C 7.4 (H) 04/09/2023    CBG (last 3)  Recent Labs    07/14/23 0728 07/14/23 0916 07/14/23 0933  GLUCAP 234* 231* 235*     DVT Prophylaxis: Apixaban on hold.  SCDs for now.  Definitive prophylaxis after surgery Code Status: Full code Family Communication: Discussed with patient Disposition Plan: To be determined  Status is: Inpatient Remains inpatient appropriate because: Left hip fracture, atrial fibrillation      Medications: Scheduled:  [MAR Hold] aspirin EC  81 mg Oral Daily   [MAR Hold] carvedilol  6.25 mg Oral BID WC   [MAR Hold] dapagliflozin propanediol  10 mg Oral QAC breakfast   [MAR Hold] famotidine  20 mg Oral BID   [MAR Hold] insulin aspart  0-15 Units Subcutaneous Q4H   [MAR Hold] insulin glargine-yfgn  15 Units Subcutaneous Daily   [MAR Hold] oxyCODONE  10 mg Oral Q12H   [MAR Hold] polyethylene glycol  17 g Oral BID   [MAR Hold] pregabalin  75 mg Oral BID   [MAR Hold] rosuvastatin  20 mg Oral Daily   [MAR Hold] senna-docusate  2 tablet Oral QHS   [MAR Hold] sodium chloride flush  3 mL Intravenous Q12H   [  MAR Hold] tamsulosin  0.4 mg Oral QPC supper   Continuous:  amiodarone 30 mg/hr (07/14/23 0249)   ceFAZolin      ceFAZolin (ANCEF) IV     tranexamic acid     PRN:[MAR Hold] acetaminophen **OR** [MAR Hold] acetaminophen, [MAR Hold] ALPRAZolam, ceFAZolin, [MAR Hold] fentaNYL (SUBLIMAZE) injection, [MAR Hold] hydrALAZINE,  [MAR Hold] methocarbamol, [MAR Hold] ondansetron **OR** [MAR Hold] ondansetron (ZOFRAN) IV, [MAR Hold] oxyCODONE, [MAR Hold] sodium chloride flush  Antibiotics: Anti-infectives (From admission, onward)    Start     Dose/Rate Route Frequency Ordered Stop   07/14/23 1009  ceFAZolin (ANCEF) 2-4 GM/100ML-% IVPB       Note to Pharmacy: Shanda Bumps M: cabinet override      07/14/23 1009 07/14/23 2214   07/14/23 0600  ceFAZolin (ANCEF) IVPB 2g/100 mL premix        2 g 200 mL/hr over 30 Minutes Intravenous On call to O.R. 07/13/23 1731 07/15/23 0559       Objective:  Vital Signs  Vitals:   07/14/23 0000 07/14/23 0736 07/14/23 0946 07/14/23 1034  BP: (!) 127/99 115/89  (P) 126/82  Pulse: (!) 103 (!) 7 (!) 59 (!) (P) 51  Resp: 19 20 19  (P) 18  Temp:  97.7 F (36.5 C)  (P) 97.9 F (36.6 C)  TempSrc:  Oral  (P) Oral  SpO2: 91% (!) 89% 97% (P) 95%  Weight:    (P) 81.6 kg  Height:    (P) 6' (1.829 m)    Intake/Output Summary (Last 24 hours) at 07/14/2023 1051 Last data filed at 07/14/2023 0900 Gross per 24 hour  Intake 57.72 ml  Output 950 ml  Net -892.28 ml   Filed Weights   07/12/23 2034 07/14/23 1034  Weight: 81.6 kg (P) 81.6 kg    Awake Alert, No new F.N deficits, Normal affect [place].AT,PERRAL Supple Neck, No JVD,   Symmetrical Chest wall movement, Good air movement bilaterally, CTAB RRR,No Gallops, Rubs or new Murmurs,  +ve B.Sounds, Abd Soft, No tenderness,   No Cyanosis, Clubbing or edema    Lab Results:  Data Reviewed: I have personally reviewed following labs and reports of the imaging studies  CBC: Recent Labs  Lab 07/12/23 2105 07/13/23 0544 07/14/23 0607  WBC 9.0 11.4* 14.0*  NEUTROABS 6.9  --   --   HGB 13.6 11.8* 12.6*  HCT 41.4 36.0* 36.5*  MCV 91.6 91.8 87.3  PLT 215 178 172    Basic Metabolic Panel: Recent Labs  Lab 07/12/23 2105 07/13/23 0544 07/14/23 0607  NA 137 133* 132*  K 4.4 3.9 4.5  CL 102 101 102  CO2 23 22 19*  GLUCOSE  158* 187* 256*  BUN 34* 33* 34*  CREATININE 1.72* 1.77* 1.74*  CALCIUM 9.6 8.8* 9.3  MG  --  1.8  --     GFR: Estimated Creatinine Clearance: 48.9 mL/min (A) (by C-G formula based on SCr of 1.74 mg/dL (H)).  Liver Function Tests: Recent Labs  Lab 07/13/23 0544  AST 20  ALT 16  ALKPHOS 70  BILITOT 0.7  PROT 6.3*  ALBUMIN 3.1*    Coagulation Profile: Recent Labs  Lab 07/13/23 0544  INR 1.2    CBG: Recent Labs  Lab 07/13/23 2133 07/14/23 0604 07/14/23 0728 07/14/23 0916 07/14/23 0933  GLUCAP 432* 284* 234* 231* 235*     Radiology Studies: DG FEMUR MIN 2 VIEWS LEFT Result Date: 07/13/2023 CLINICAL DATA:  Hip fracture. EXAM: LEFT FEMUR 2 VIEWS COMPARISON:  Hip radiograph yesterday FINDINGS: Left femoral neck fracture with mild displacement and angulation. The distal femur is intact. No additional femur fracture. Knee alignment is maintained. Vascular calcifications are seen. IMPRESSION: Left femoral neck fracture with mild displacement and angulation. Electronically Signed   By: Narda Rutherford M.D.   On: 07/13/2023 15:12   CT HEAD WO CONTRAST ( ) Result Date: 07/13/2023 CLINICAL DATA:  Trauma fell getting out of shower EXAM: CT HEAD WITHOUT CONTRAST TECHNIQUE: Contiguous axial images were obtained from the base of the skull through the vertex without intravenous contrast. RADIATION DOSE REDUCTION: This exam was performed according to the departmental dose-optimization program which includes automated exposure control, adjustment of the mA and/or kV according to patient size and/or use of iterative reconstruction technique. COMPARISON:  CT brain 02/20/2023 FINDINGS: Brain: No acute territorial infarction, hemorrhage or intracranial mass. Mild atrophy. Mild chronic small vessel ischemic changes of the white matter. The ventricles are nonenlarged Vascular: No hyperdense vessels.  Carotid vascular calcification Skull: Normal. Negative for fracture or focal lesion.  Sinuses/Orbits: Moderate mucosal thickening in the sinuses. Chronic appearing nasal bone deformity Other: None IMPRESSION: 1. No CT evidence for acute intracranial abnormality. 2. Mild atrophy and chronic small vessel ischemic changes of the white matter. 3. Moderate mucosal thickening in the sinuses. Electronically Signed   By: Jasmine Pang M.D.   On: 07/13/2023 00:02   DG Hip Unilat W or Wo Pelvis 2-3 Views Left Result Date: 07/12/2023 CLINICAL DATA:  Left hip pain after fall. EXAM: DG HIP (WITH OR WITHOUT PELVIS) 2-3V LEFT COMPARISON:  None Available. FINDINGS: Displaced left femoral neck fracture. Femoral head is well seated in the acetabulum. No hip dislocation. Bony pelvis and pubic rami are intact. Pubic symphysis and sacroiliac joints are congruent. IMPRESSION: Displaced left femoral neck fracture. Electronically Signed   By: Narda Rutherford M.D.   On: 07/12/2023 23:37   DG Chest 1 View Result Date: 07/12/2023 CLINICAL DATA:  Fall getting out of the shower. EXAM: CHEST  1 VIEW COMPARISON:  Chest radiograph 05/24/2023 FINDINGS: Prior median sternotomy with left atrial clipping. The heart is enlarged. Stable mediastinal contours. Lower lung volumes from prior exam. Similar vascular congestion. No pneumothorax or large pleural effusion. On limited assessment, no acute osseous findings. IMPRESSION: Stable cardiomegaly with vascular congestion. Electronically Signed   By: Narda Rutherford M.D.   On: 07/12/2023 23:37     LOS: 2 days   Signature  -    Susa Raring M.D on 07/14/2023 at 10:52 AM   -  To page go to [email or web address]

## 2023-07-14 NOTE — TOC Progression Note (Signed)
 Transition of Care [facility]) - Progression Note    Patient Details  Name: David [name] MRN: [id number] Date of Birth: 07/15/1960  [facility]) CM/SW Contact  Gordy Clement, RN Phone Number: 07/14/2023, 4:04 PM  Clinical Narrative:     Surgery today. PT OT pending TOC following for recs          Expected Discharge Plan and Services                                               Social Determinants of Health (SDOH) Interventions SDOH Screenings   Food Insecurity: Patient Declined (07/13/2023)  Recent Concern: Food Insecurity - Food Insecurity Present (05/28/2023)  Housing: Patient Declined (07/13/2023)  Recent Concern: Housing - High Risk (05/28/2023)  Transportation Needs: Patient Declined (07/13/2023)  Recent Concern: Transportation Needs - Unmet Transportation Needs (05/28/2023)  Utilities: Not At Risk (04/10/2023)  Alcohol Screen: Low Risk  (03/01/2023)  Depression (PHQ2-9): Medium Risk (06/22/2023)  Financial Resource Strain: High Risk (05/28/2023)  Physical Activity: Insufficiently Active (05/28/2023)  Social Connections: Unknown (05/28/2023)  Stress: Stress Concern Present (05/28/2023)  Tobacco Use: Medium Risk (07/14/2023)    Readmission Risk Interventions    09/06/2021   12:07 PM  Readmission Risk Prevention Plan  Post Dischage Appt Complete  Medication Screening Complete  Transportation Screening Complete

## 2023-07-14 NOTE — Transfer of Care (Signed)
 Immediate Anesthesia Transfer of Care Note  Patient: David [name]  Procedure(s) Performed: ARTHROPLASTY, HIP, TOTAL, ANTERIOR APPROACH (Left: Hip)  Patient Location: PACU  Anesthesia Type:General  Level of Consciousness: awake and alert   Airway & Oxygen Therapy: Patient Spontanous Breathing and Patient connected to face mask oxygen  Post-op Assessment: Report given to RN and Post -op Vital signs reviewed and stable  Post vital signs: Reviewed and stable  Last Vitals:  Vitals Value Taken Time  BP 118/68 07/14/23 1432  Temp 36.8 C 07/14/23 1432  Pulse 81 07/14/23 1440  Resp 20 07/14/23 1440  SpO2 95 % 07/14/23 1440  Vitals shown include unfiled device data.  Last Pain:  Vitals:   07/14/23 1040  TempSrc:   PainSc: 3       Patients Stated Pain Goal: 1 (07/14/23 1040)  Complications: No notable events documented.

## 2023-07-14 NOTE — Op Note (Signed)
 Orthopaedic Surgery Operative Note (CSN: [id number] ) Date of Surgery: 07/14/2023  Admit Date: 07/12/2023   Diagnoses: Pre-Op Diagnoses: Left displaced femoral neck fracture  Post-Op Diagnosis: Same  Procedures: CPT 27130-Left total hip arthoplasty for femoral neck fracture  Surgeons : Primary: Roby Lofts, MD  Assistant: Thyra Breed, PA-C  Location: OR 3   Anesthesia: General   Antibiotics: Ancef 2g preop with 1 gm vancomycin powder placed topically   Tourniquet time: None   Estimated Blood Loss: 600 mL  Complications: None  Specimens:* No specimens in log *   Implants: Implant Name Type Inv. Item Serial No. Manufacturer Lot No. LRB No. Used Action  SHELL ACET G7 4H 56 SZF - [id number] Shell SHELL ACET G7 4H 56 SZF  ZIMMER RECON(ORTH,TRAU,BIO,SG) [id number] Left 1 Implanted  LINER ACETAB NN G7 F 36 - [id number] Liner LINER ACETAB NN G7 F 36  ZIMMER [employer]ON(ORTH,TRAU,BIO,SG) [id number] Left 1 Implanted  STEM FEM CMTLS HIP 14X148 123D - [id number] Stem STEM FEM CMTLS HIP 14X148 123D  ZIMMER [employer]ON(ORTH,TRAU,BIO,SG) [id number] Left 1 Implanted  HEAD CERAMIC BIOLOX 36 T1 STD - [id number] Head HEAD CERAMIC BIOLOX 36 T1 STD  ZIMMER [employer]ON(ORTH,TRAU,BIO,SG) [id number] Left 1 Implanted     Indications for Surgery: 63 year old male who sustained a ground-level fall with a displaced left femoral neck fracture.  He has a history of anticoagulation on Eliquis and was over 48 hours from his previous dose.  I felt that he was indicated for total hip arthroplasty of his left hip.  Risks and benefits were discussed with the patient.  Risks included but not limited to bleeding, infection, hip dislocation, periprosthetic fracture, leg length discrepancy, pain, nerve and blood vessel injury including lateral femoral cutaneous nerve injury, DVT, and the possibility anesthetic complications.  He agreed to proceed with surgery and consent was obtained.  Operative Findings: 1.  Left total hip  arthroplasty for fracture through anterior approach using [employer] G7 acetabular system 56 mm shell size with a liner size F 2.  G7 acetabular system vitamin E highly cross-linked polyethylene liner 36 mm head size F 3.  [employer] Taperloc reduced distal 14 x 140 mm high offset cementless stem 4.  [employer] Biolox delta modular ceramic head 36 mm with a standard neck.  Procedure: The patient was identified in the preoperative holding area. Consent was confirmed with the patient and their family and all questions were answered. The operative extremity was marked after confirmation with the patient. he was then brought back to the operating room by our anesthesia colleagues.  He was placed under general anesthetic and carefully transferred over to the [facility] table.  All bony prominences were well-padded.  Fluoroscopic imaging showed the unstable nature of his injury.  A AP pelvis was used for preoperative planning.  The left hip and pelvis and lower extremity were then prepped and draped in usual sterile fashion.  A timeout was performed to verify the patient, the procedure, and the extremity.  Preoperative antibiotics were dosed.  A standard anterior approach to the hip through intact direct anterior was made and carried down through skin subcutaneous tissue.  Identified the perforators into the TFL fascia and incised just anterior to this.  I then swept the TFL off of the fascia and entered the interval.  Here I encountered the perforator vessels that I was able to cauterize.  I then I retracted the soft tissue out of the way so I can visualize the capsule in the femoral neck.  I  cleaned out some of the fat pad over the anterior neck and released a little bit of the indirect head of the rectus.  I then made my capsulotomy and entered the hip joint.  I then released the capsule the way down until I could palpate the lesser trochanter.  I then performed a intercalary neck cut using a saw and an  osteotome.  I was then able to remove the femoral head with the use of a corkscrew.  I then prepared the acetabulum.  I remove the acetabular labrum and the surrounding capsule to have a good access and visualization of the acetabulum.  I cleaned out the ligamentum flavum.  I then proceeded to ream from 47 mm all the way up to 55 mm.  I did this under fluoroscopic imaging to make sure I was medial enough and had a good abduction and version of my cup.  Once I had good bony bleeding down to the subchondral bone I felt that I could press-fit a 56 mm cup.  I used the [employer] G7 acetabular system and was able to place the cup in the acetabulum using fluoroscopic imaging to guide my abduction and version.  I was able to get a great fit without any motion at the cup.  I then placed the vitamin E highly cross-linked polyethylene for a size 36 mm head.  I then proceeded to focus on the femur.  I was able to deliver the femur through the wound.  I was able to retract the soft tissue of the leg and the proceeded to release the superior capsule to provide better access for broaching of the femur.  I sequentially broached until I reached a 14 mm size and I get excellent fit with rotational stability.  I then trialed off of this with a standard neck and a -3, 36 mm femoral head.  I did have reduced offset compared to the contralateral side and I was a little bit short on the length.  As result I felt that a 14 mm stem with a high offset would be most appropriate and then we placed the Taperloc high offset stem.  I was able to get good rotational stability and confirmed good proximal fit.  I then trialed off of the stem with a -3 femoral head which had some shock to the hip and I felt that I wanted more stability and the leg lengths were relatively equal on fluoroscopy.  I then trialed the standard head.  I had good stability in neutral and extra rotation of nearly 90 degrees.  And when I extended the hip and external  rotation there is still no instability.  I felt that this would be most appropriate.  I then removed the trial head and placed the ceramic 36 mm standard head.  The hip was reduced and final fluoroscopic imaging was obtained.  The incision was copiously irrigated.  A gram of vancomycin powder was placed into the incision.  #1 Ethibond was used to close the capsule.  The fascia was closed with #1 Vicryl.  The skin was closed with 2-0 Monocryl and 3-0 Monocryl and Dermabond.  Sterile dressing was applied.  The patient was then awoke from anesthesia and taken to the PACU in stable condition.  Post Op Plan/Instructions: Patient will be weightbearing as tolerated to the left lower extremity.  He will have no hip precautions.  He will receive postoperative Ancef.  I would like to hold off on restarting his Eliquis until  postoperative day 2 which will be 07/16/2023.  We will have him mobilize with physical occupational therapy.  I was present and performed the entire surgery.  Thyra Breed, PA-C did assist me throughout the case. An assistant was necessary given the difficulty in approach, maintenance of reduction and ability to instrument the fracture.   Truitt Merle, MD [facility]

## 2023-07-14 NOTE — Telephone Encounter (Signed)
 Spoke to patient, today his hip got replaced so once he get out of rehab will start Repatha and has appointment with [facility] in June will get lab done then or will wait till he have had at least 5 Repatha injections.

## 2023-07-14 NOTE — Progress Notes (Signed)
 Orthopedic Tech Progress Note Patient Details:  David [name] Aug 13, 1960 [id number]  Patient ID: [name], male   DOB: 09-16-1960, 63 y.o.   MRN: [id number] Installed Overhead from with trapeze. Pt. Satisfied with placement and demonstrated use without problem. Tonye Pearson 07/14/2023, 4:06 PM

## 2023-07-14 NOTE — Progress Notes (Addendum)
 PHARMACY - ANTICOAGULATION CONSULT NOTE  Pharmacy Consult for apixaban Indication: atrial fibrillation  No Known Allergies  Patient Measurements: Height: (P) 6' (182.9 cm) Weight: (P) 81.6 kg (180 lb) IBW/kg (Calculated) : (P) 77.6 HEPARIN DW (KG): (P) 81.6  Vital Signs: Temp: 98.2 F (36.8 C) (04/11 1432) Temp Source: (P) Oral (04/11 1034) BP: 133/79 (04/11 1445) Pulse Rate: 81 (04/11 1445)  Labs: Recent Labs    07/12/23 2105 07/13/23 0544 07/14/23 0607 07/14/23 1247  HGB 13.6 11.8* 12.6* 12.6*  HCT 41.4 36.0* 36.5* 37.0*  PLT 215 178 172  --   APTT  --  33  --   --   LABPROT  --  15.7*  --   --   INR  --  1.2  --   --   CREATININE 1.72* 1.77* 1.74* 1.70*    Estimated Creatinine Clearance: 50.1 mL/min (A) (by C-G formula based on SCr of 1.7 mg/dL (H)).   Medical History: Past Medical History:  Diagnosis Date   Cardiomyopathy (HCC)    a. EF 45% in 2019.   CHF (congestive heart failure) (HCC)    Chronic anticoagulation 09/30/2017   Diabetes mellitus type 2 in nonobese [facility])    Discitis of thoracic region 09/25/2022   Does not have health insurance    Essential hypertension 09/26/2017   Financial difficulties    H/O noncompliance with medical treatment, presenting hazards to health    Hardware complicating wound infection (HCC) 09/25/2022   Non-insulin treated type 2 diabetes mellitus (HCC) 09/26/2017   Persistent atrial fibrillation (HCC)    Sleep apnea suspected 09/30/2017   "years ago"- not anymore per patient    Medications:  Scheduled:   [MAR Hold] aspirin EC  81 mg Oral Daily   [MAR Hold] carvedilol  6.25 mg Oral BID WC   [MAR Hold] dapagliflozin propanediol  10 mg Oral QAC breakfast   [MAR Hold] famotidine  20 mg Oral BID   fentaNYL       fentaNYL       [MAR Hold] insulin aspart  0-15 Units Subcutaneous Q4H   [MAR Hold] insulin glargine-yfgn  15 Units Subcutaneous Daily   [MAR Hold] oxyCODONE  10 mg Oral Q12H   [MAR Hold] polyethylene glycol   17 g Oral BID   [MAR Hold] pregabalin  75 mg Oral BID   [MAR Hold] rosuvastatin  20 mg Oral Daily   [MAR Hold] senna-docusate  2 tablet Oral QHS   [MAR Hold] sodium chloride flush  3 mL Intravenous Q12H   [MAR Hold] tamsulosin  0.4 mg Oral QPC supper    Assessment: 62 yom presenting with L hip fracture who underwent repair on 4/11.  On apixaban 5 mg twice daily PTA - dose appropriate given age<80 and wt>60 kg despite Scr>1.5. Hgb 12.6, plt 172. Last dose on 4/9 AM.   Okay per Ortho to restart PTA direct oral anticoagulant (DOAC) on POD1 pending AM hemoglobin being stable (>/= 9) and not requiring blood transfusion.  Goal of Therapy:  Monitor platelets by anticoagulation protocol: Yes   Plan:  Restart apixaban 5 mg BID on POD1 if Hgb >/= 9 and no PRBC requirement Monitor for s/sx of bleeding   Thank you for allowing pharmacy to participate in this patient's care,  Sherron Monday, PharmD, BCCCP Clinical Pharmacist  Phone: [phone] 07/14/2023 3:34 PM  Please check AMION for all [facility] Pharmacy phone numbers After 10:00 PM, call Main Pharmacy [phone]  ADDENDUM Consult placed to change from POD1 to  POD2 for apixaban restart.   Thank you for allowing pharmacy to participate in this patient's care,  Sherron Monday, PharmD, BCCCP Clinical Pharmacist

## 2023-07-14 NOTE — Interval H&P Note (Signed)
 History and Physical Interval Note:  07/14/2023 10:52 AM  David [name]  has presented today for surgery, with the diagnosis of left hip fracture.  The various methods of treatment have been discussed with the patient and family. After consideration of risks, benefits and other options for treatment, the patient has consented to  Procedure(s): ARTHROPLASTY, HIP, TOTAL, ANTERIOR APPROACH (Left) as a surgical intervention.  The patient's history has been reviewed, patient examined, no change in status, stable for surgery.  I have reviewed the patient's chart and labs.  Questions were answered to the patient's satisfaction.     Caryn Bee P Llewellyn Schoenberger

## 2023-07-14 NOTE — Addendum Note (Signed)
 Addended by: Tylene Fantasia on: 07/14/2023 03:46 PM   Modules accepted: Orders

## 2023-07-14 NOTE — Anesthesia Preprocedure Evaluation (Signed)
 Anesthesia Evaluation  Patient identified by MRN, date of birth, ID band Patient awake    Reviewed: Allergy & Precautions, H&P , NPO status , Patient's Chart, lab work & pertinent test results  Airway Mallampati: II   Neck ROM: full    Dental   Pulmonary sleep apnea , former smoker   breath sounds clear to auscultation       Cardiovascular hypertension, + CAD, + CABG and +CHF  + dysrhythmias Atrial Fibrillation  Rhythm:irregular Rate:Normal     Neuro/Psych  PSYCHIATRIC DISORDERS Anxiety        GI/Hepatic   Endo/Other  diabetes, Type 2    Renal/GU      Musculoskeletal  (+) Arthritis ,    Abdominal   Peds  Hematology   Anesthesia Other Findings   Reproductive/Obstetrics                             Anesthesia Physical Anesthesia Plan  ASA: 3  Anesthesia Plan: General   Post-op Pain Management:    Induction: Intravenous  PONV Risk Score and Plan: 2 and Ondansetron, Dexamethasone, Treatment may vary due to age or medical condition and Midazolam  Airway Management Planned: Oral ETT  Additional Equipment:   Intra-op Plan:   Post-operative Plan: Extubation in OR  Informed Consent: I have reviewed the patients History and Physical, chart, labs and discussed the procedure including the risks, benefits and alternatives for the proposed anesthesia with the patient or authorized representative who has indicated his/her understanding and acceptance.     Dental advisory given  Plan Discussed with: CRNA, Anesthesiologist and Surgeon  Anesthesia Plan Comments:        Anesthesia Quick Evaluation

## 2023-07-14 NOTE — Plan of Care (Signed)
  Problem: Education: Goal: Ability to describe self-care measures that may prevent or decrease complications (Diabetes Survival Skills Education) will improve Outcome: Progressing Goal: Individualized Educational Video(s) Outcome: Progressing   Problem: Coping: Goal: Ability to adjust to condition or change in health will improve Outcome: Progressing   Problem: Fluid Volume: Goal: Ability to maintain a balanced intake and output will improve Outcome: Progressing   Problem: Health Behavior/Discharge Planning: Goal: Ability to identify and utilize available resources and services will improve Outcome: Progressing Goal: Ability to manage health-related needs will improve Outcome: Progressing   Problem: Metabolic: Goal: Ability to maintain appropriate glucose levels will improve Outcome: Progressing   Problem: Nutritional: Goal: Maintenance of adequate nutrition will improve Outcome: Progressing Goal: Progress toward achieving an optimal weight will improve Outcome: Progressing   Problem: Skin Integrity: Goal: Risk for impaired skin integrity will decrease Outcome: Progressing   Problem: Tissue Perfusion: Goal: Adequacy of tissue perfusion will improve Outcome: Progressing   Problem: Health Behavior/Discharge Planning: Goal: Ability to manage health-related needs will improve Outcome: Progressing   Problem: Clinical Measurements: Goal: Ability to maintain clinical measurements within normal limits will improve Outcome: Progressing Goal: Will remain free from infection Outcome: Progressing Goal: Diagnostic test results will improve Outcome: Progressing Goal: Respiratory complications will improve Outcome: Progressing Goal: Cardiovascular complication will be avoided Outcome: Progressing   Problem: Activity: Goal: Risk for activity intolerance will decrease Outcome: Progressing   Problem: Nutrition: Goal: Adequate nutrition will be maintained Outcome:  Progressing   Problem: Coping: Goal: Level of anxiety will decrease Outcome: Progressing   Problem: Elimination: Goal: Will not experience complications related to bowel motility Outcome: Progressing Goal: Will not experience complications related to urinary retention Outcome: Progressing   Problem: Pain Managment: Goal: General experience of comfort will improve and/or be controlled Outcome: Progressing   Problem: Safety: Goal: Ability to remain free from injury will improve Outcome: Progressing   Problem: Skin Integrity: Goal: Risk for impaired skin integrity will decrease Outcome: Progressing

## 2023-07-15 DIAGNOSIS — S7292XA Unspecified fracture of left femur, initial encounter for closed fracture: Secondary | ICD-10-CM | POA: Diagnosis not present

## 2023-07-15 LAB — CBC
HCT: 28.2 % — ABNORMAL LOW (ref 39.0–52.0)
Hemoglobin: 9.4 g/dL — ABNORMAL LOW (ref 13.0–17.0)
MCH: 30.6 pg (ref 26.0–34.0)
MCHC: 33.3 g/dL (ref 30.0–36.0)
MCV: 91.9 fL (ref 80.0–100.0)
Platelets: 139 10*3/uL — ABNORMAL LOW (ref 150–400)
RBC: 3.07 MIL/uL — ABNORMAL LOW (ref 4.22–5.81)
RDW: 14.1 % (ref 11.5–15.5)
WBC: 12.6 10*3/uL — ABNORMAL HIGH (ref 4.0–10.5)
nRBC: 0 % (ref 0.0–0.2)

## 2023-07-15 LAB — BASIC METABOLIC PANEL WITH GFR
Anion gap: 12 (ref 5–15)
BUN: 38 mg/dL — ABNORMAL HIGH (ref 8–23)
CO2: 23 mmol/L (ref 22–32)
Calcium: 8.9 mg/dL (ref 8.9–10.3)
Chloride: 98 mmol/L (ref 98–111)
Creatinine, Ser: 2.18 mg/dL — ABNORMAL HIGH (ref 0.61–1.24)
GFR, Estimated: 34 mL/min — ABNORMAL LOW (ref >60)
Glucose, Bld: 239 mg/dL — ABNORMAL HIGH (ref 70–99)
Potassium: 5.4 mmol/L — ABNORMAL HIGH (ref 3.5–5.1)
Sodium: 133 mmol/L — ABNORMAL LOW (ref 135–145)

## 2023-07-15 LAB — GLUCOSE, CAPILLARY
Glucose-Capillary: 140 mg/dL — ABNORMAL HIGH (ref 70–99)
Glucose-Capillary: 161 mg/dL — ABNORMAL HIGH (ref 70–99)
Glucose-Capillary: 187 mg/dL — ABNORMAL HIGH (ref 70–99)
Glucose-Capillary: 197 mg/dL — ABNORMAL HIGH (ref 70–99)
Glucose-Capillary: 203 mg/dL — ABNORMAL HIGH (ref 70–99)
Glucose-Capillary: 227 mg/dL — ABNORMAL HIGH (ref 70–99)

## 2023-07-15 LAB — BRAIN NATRIURETIC PEPTIDE: B Natriuretic Peptide: 126.5 pg/mL — ABNORMAL HIGH (ref 0.0–100.0)

## 2023-07-15 LAB — MAGNESIUM: Magnesium: 2.2 mg/dL (ref 1.7–2.4)

## 2023-07-15 MED ORDER — HYDROCODONE-ACETAMINOPHEN 10-325 MG PO TABS
1.0000 | ORAL_TABLET | Freq: Four times a day (QID) | ORAL | Status: DC
  Administered 2023-07-15 – 2023-07-20 (×19): 1 via ORAL
  Filled 2023-07-15 (×20): qty 1

## 2023-07-15 MED ORDER — HYDROMORPHONE HCL 1 MG/ML IJ SOLN
0.5000 mg | INTRAMUSCULAR | Status: DC | PRN
  Administered 2023-07-15 – 2023-07-19 (×18): 1 mg via INTRAVENOUS
  Filled 2023-07-15 (×18): qty 1

## 2023-07-15 MED ORDER — AMIODARONE HCL 200 MG PO TABS
200.0000 mg | ORAL_TABLET | Freq: Two times a day (BID) | ORAL | Status: DC
  Administered 2023-07-15 – 2023-07-21 (×13): 200 mg via ORAL
  Filled 2023-07-15 (×13): qty 1

## 2023-07-15 MED ORDER — INSULIN GLARGINE-YFGN 100 UNIT/ML ~~LOC~~ SOLN
18.0000 [IU] | Freq: Every day | SUBCUTANEOUS | Status: DC
  Administered 2023-07-16 – 2023-07-21 (×6): 18 [IU] via SUBCUTANEOUS
  Filled 2023-07-15 (×6): qty 0.18

## 2023-07-15 MED ORDER — HYDROMORPHONE HCL 1 MG/ML IJ SOLN
0.5000 mg | INTRAMUSCULAR | Status: DC | PRN
  Administered 2023-07-15 (×2): 0.5 mg via INTRAVENOUS
  Filled 2023-07-15 (×2): qty 0.5

## 2023-07-15 MED ORDER — LACTATED RINGERS IV SOLN: INTRAVENOUS | Status: DC

## 2023-07-15 MED ORDER — FAMOTIDINE 20 MG PO TABS: 20.0000 mg | ORAL_TABLET | Freq: Two times a day (BID) | ORAL | Status: DC

## 2023-07-15 MED ORDER — ACETAMINOPHEN 325 MG PO TABS
650.0000 mg | ORAL_TABLET | Freq: Three times a day (TID) | ORAL | Status: DC
  Administered 2023-07-15 – 2023-07-16 (×2): 650 mg via ORAL
  Filled 2023-07-15 (×2): qty 2

## 2023-07-15 MED ORDER — LACTATED RINGERS IV SOLN: INTRAVENOUS | Status: AC

## 2023-07-15 MED ORDER — CARVEDILOL 3.125 MG PO TABS
3.1250 mg | ORAL_TABLET | Freq: Two times a day (BID) | ORAL | Status: DC
  Administered 2023-07-15 (×2): 3.125 mg via ORAL
  Filled 2023-07-15 (×2): qty 1

## 2023-07-15 MED ORDER — DILTIAZEM HCL 25 MG/5ML IV SOLN: 10.0000 mg | Freq: Four times a day (QID) | INTRAVENOUS | Status: DC | PRN

## 2023-07-15 MED ORDER — OXYCODONE HCL ER 10 MG PO T12A
10.0000 mg | EXTENDED_RELEASE_TABLET | Freq: Two times a day (BID) | ORAL | Status: DC
  Administered 2023-07-15 – 2023-07-21 (×12): 10 mg via ORAL
  Filled 2023-07-15 (×12): qty 1

## 2023-07-15 MED ORDER — SODIUM ZIRCONIUM CYCLOSILICATE 10 G PO PACK
10.0000 g | PACK | Freq: Once | ORAL | Status: AC
  Administered 2023-07-15: 10 g via ORAL
  Filled 2023-07-15: qty 1

## 2023-07-15 NOTE — Evaluation (Signed)
 Physical Therapy Evaluation Patient Details Name: David [name] MRN: [id number] DOB: 1960-09-29 Today's Date: 07/15/2023  History of Present Illness  63 yo presented 07/12/23 after fall landing on left hip on the side of the tub. 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  Clinical Impression   Pt admitted secondary to problem above with deficits below. PTA patient was modified independent with use of cane. He was attending OPPT working on LLE strength and balance. Pt currently requires min assist for sit to stand and CGA for ambulation x 30 ft with RW. Patient eager to go to AIR prior to discharge home. Explained he likely does not meet the medical complexity requirement for AIR. Anticipate excellent progress with return home with HHPT.  Anticipate patient will benefit from PT to address problems listed below. Will continue to follow acutely to maximize functional mobility independence and safety.           If plan is discharge home, recommend the following: A little help with walking and/or transfers;A little help with bathing/dressing/bathroom;Assistance with cooking/housework;Assist for transportation;Help with stairs or ramp for entrance   Can travel by private vehicle        Equipment Recommendations None recommended by PT  Recommendations for Other Services  Rehab consult (pt request)    Functional Status Assessment Patient has had a recent decline in their functional status and demonstrates the ability to make significant improvements in function in a reasonable and predictable amount of time.     Precautions / Restrictions Precautions Precautions: Anterior Hip Restrictions Weight Bearing Restrictions Per Provider Order: Yes LLE Weight Bearing Per Provider Order: Weight bearing as tolerated      Mobility  Bed  Mobility Overal bed mobility: Needs Assistance Bed Mobility: Supine to Sit     Supine to sit: Supervision, HOB elevated, Used rails     General bed mobility comments: incr time and effort, but no physical assist    Transfers Overall transfer level: Needs assistance Equipment used: Rolling walker (2 wheels) Transfers: Sit to/from Stand Sit to Stand: Min assist           General transfer comment: multiple attempts with pt trying not to use his UEs (CABG in 04/2023); educated ok to use his UEs and stood with min assist    Ambulation/Gait Ambulation/Gait assistance: Contact guard assist Gait Distance (Feet): 30 Feet Assistive device: Rolling walker (2 wheels) Gait Pattern/deviations: Step-through pattern, Decreased stride length, Antalgic   Gait velocity interpretation: 1.31 - 2.62 ft/sec, indicative of limited community ambulator   General Gait Details: walked to sink and performed grooming while standing with single UE support; walked around bed to chair  Stairs            Wheelchair Mobility     Tilt Bed    Modified Rankin (Stroke Patients Only)       Balance Overall balance assessment: Needs assistance Sitting-balance support: No upper extremity supported, Feet supported Sitting balance-Leahy Scale: Good     Standing balance support: Single extremity supported Standing balance-Leahy Scale: Fair                               Pertinent Vitals/Pain Pain Assessment Pain Assessment: 0-10 Pain Score: 6  Pain Location: left hip Pain Descriptors / Indicators: Operative  site guarding Pain Intervention(s): Limited activity within patient's tolerance, Monitored during session, Premedicated before session, Repositioned    Home Living Family/patient expects to be discharged to:: Private residence Living Arrangements: Spouse/significant other Available Help at Discharge: Family Type of Home: House Home Access: Level entry       Home Layout:  One level Home Equipment: Hand held Programmer, systems (2 wheels);Cane - quad;Wheelchair - manual;BSC/3in1      Prior Function Prior Level of Function : Independent/Modified Independent             Mobility Comments: utilized SPC for mobility       Extremity/Trunk Assessment   Upper Extremity Assessment Upper Extremity Assessment: Overall WFL for tasks assessed    Lower Extremity Assessment Lower Extremity Assessment: LLE deficits/detail LLE Deficits / Details: weakness prior to fall from prior back surgery; currently limited by pain and unable to accurately assess strength LLE: Unable to fully assess due to pain    Cervical / Trunk Assessment Cervical / Trunk Assessment: Normal  Communication   Communication Communication: No apparent difficulties    Cognition Arousal: Alert Behavior During Therapy: WFL for tasks assessed/performed   PT - Cognitive impairments: No apparent impairments                         Following commands: Intact       Cueing Cueing Techniques: Verbal cues     General Comments General comments (skin integrity, edema, etc.): VSS per monitor on RA    Exercises Total Joint Exercises Ankle Circles/Pumps: AROM, Both, 10 reps Quad Sets: AROM, Both, 5 reps Heel Slides: AAROM, Left, Other reps (comment) (2)   Assessment/Plan    PT Assessment Patient needs continued PT services  PT Problem List Decreased activity tolerance;Decreased balance;Decreased mobility;Decreased knowledge of use of DME;Decreased strength;Pain       PT Treatment Interventions DME instruction;Gait training;Functional mobility training;Therapeutic activities;Therapeutic exercise;Patient/family education    PT Goals (Current goals can be found in the Care Plan section)  Acute Rehab PT Goals Patient Stated Goal: go to AIR and then home PT Goal Formulation: With patient Time For Goal Achievement: 07/29/23 Potential to Achieve Goals: Good     Frequency Min 3X/week     Co-evaluation               AM-PAC PT "6 Clicks" Mobility  Outcome Measure Help needed turning from your back to your side while in a flat bed without using bedrails?: A Little Help needed moving from lying on your back to sitting on the side of a flat bed without using bedrails?: A Little Help needed moving to and from a bed to a chair (including a wheelchair)?: A Little Help needed standing up from a chair using your arms (e.g., wheelchair or bedside chair)?: A Little Help needed to walk in hospital room?: A Little Help needed climbing 3-5 steps with a railing? : A Lot 6 Click Score: 17    End of Session Equipment Utilized During Treatment: Gait belt Activity Tolerance: Patient tolerated treatment well Patient left: in chair;with call bell/phone within reach;with chair alarm set Nurse Communication: Mobility status;Other (comment) (IV occluded) PT Visit Diagnosis: Other abnormalities of gait and mobility (R26.89);Pain Pain - Right/Left: Left Pain - part of body: Hip    Time: 1312-1350 PT Time Calculation (min) (ACUTE ONLY): 38 min   Charges:   PT Evaluation $PT Eval Low Complexity: 1 Low PT Treatments $Gait Training: 8-22 mins $Therapeutic Activity:  8-22 mins PT General Charges $$ ACUTE PT VISIT: 1 Visit          Gayle Kava, PT Acute Rehabilitation Services  Office [phone]   [place] Leep 07/15/2023, 2:34 PM

## 2023-07-15 NOTE — Progress Notes (Signed)
 Pt. Requesting a nerve block due to poorly controlled pain. Sent message to [facility], [place], who says a post-op nerve block is not an option, but that she will adjust his pain medications. Patient made aware.

## 2023-07-15 NOTE — Progress Notes (Addendum)
 [facility] PROGRESS NOTE   David [name]:[id number] DOB: 06-14-1960 DOA: 07/12/2023  PCP: [name], MD  Brief History: 63 y.o. male with medical history significant of chronic atrial fibrillation, heart failure with reduced ejection fraction, essential hypertension, CABG in 04/2023, CKD stage IIIb, non-insulin-dependent DM type II, and hyperlipidemia, spinal surgery for infection last year who had a mechanical fall at home resulting in pain in the left hip area.  Found to have left hip fracture.  Hospitalized for further management.   Consultants: Cardiology.  Orthopedics  Procedures: None yet  Subjective/Interval History: Patient in bed, appears comfortable, denies any headache, no fever, no chest pain or pressure, no shortness of breath , no abdominal pain. No focal weakness.   Assessment/Plan:  Left hip fracture This was secondary to mechanical fall. Orthopedics has been consulted, preop clearance provided by cardiology, Eliquis is on hold resume once cleared by orthopedic surgery Surgery due on 07/14/2023, patient agreeable for any perioperative blood transfusion, per orthopedic surgery weightbearing as tolerated, resume Eliquis from 07/16/2023, follow-up with Dr. [name] in 2 weeks post discharge.  Coronary artery disease status post CABG in January 2025 Apparently patient has not been taking aspirin for unknown reasons.  Eliquis currently on hold. Continue carvedilol as tolerated by blood pressure and heart rate and statin.   Appreciate cardiology input.  Permanent atrial fibrillation Went into RVR was briefly placed on amiodarone drip now on oral amnio, blood pressure low hence Coreg dose had to be dropped, Eliquis as above.  Hypertension.  Blood pressure on the softer side.  Cut back Coreg, hold Lasix on 07/15/2023, gentle IV fluids, minimize narcotic use and monitor.  Patient counseled.    History of back surgery.  Supportive care.  Chronically on narcotics.     Chronic pain.  Takes high doses of narcotics chronically, blood pressure running soft counseled not to overuse narcotics.    Chronic systolic CHF with recovered EF Echocardiogram from January showed LVEF of 65 to 70%.  No evidence for volume overload currently.  Continue beta-blocker as tolerated by blood pressure and heart rate.  Dose Lasix daily.  AKI on chronic kidney disease stage IIIb Continued baseline around 1.7, hold Lasix, hydrate, avoid hypotension.  Hyperlipidemia Continue statin.  Peripheral neuropathy Continue Lyrica.  Hyperkalemia.  Received Lokelma.    History of BPH Continue Flomax  Insulin-dependent diabetes mellitus type 2 HbA1c 7.4 in January.  On basal insulin at home.  Continue glargine and SSI.,  Sliding scale changed to every 4 while NPO.  Lab Results  Component Value Date   HGBA1C 7.4 (H) 04/09/2023    CBG (last 3)  Recent Labs    07/14/23 2332 07/15/23 0343 07/15/23 0749  GLUCAP 265* 203* 227*     DVT Prophylaxis: Apixaban on hold.  SCDs for now.  Definitive prophylaxis after surgery Code Status: Full code Family Communication: Discussed with patient Disposition Plan: To be determined  Status is: Inpatient Remains inpatient appropriate because: Left hip fracture, atrial fibrillation      Medications: Scheduled:  amiodarone  200 mg Oral BID   aspirin EC  81 mg Oral Daily   carvedilol  3.125 mg Oral BID WC   dapagliflozin propanediol  10 mg Oral QAC breakfast   docusate sodium  100 mg Oral BID   [START ON 07/16/2023] famotidine  20 mg Oral BID   heparin injection (subcutaneous)  5,000 Units Subcutaneous Q8H   HYDROcodone-acetaminophen  1 tablet Oral Q6H   insulin aspart  0-15  Units Subcutaneous Q4H   [START ON 07/16/2023] insulin glargine-yfgn  18 Units Subcutaneous Daily   oxyCODONE  10 mg Oral Q12H   polyethylene glycol  17 g Oral BID   pregabalin  75 mg Oral BID   rosuvastatin  20 mg Oral Daily   senna-docusate  2 tablet Oral  QHS   sodium chloride flush  3 mL Intravenous Q12H   tamsulosin  0.4 mg Oral QPC supper   Continuous:   ceFAZolin (ANCEF) IV 2 g (07/15/23 0401)   lactated ringers     [name]:[id number], diltiazem, hydrALAZINE, HYDROmorphone (DILAUDID) injection, menthol-cetylpyridinium **OR** phenol, methocarbamol, metoCLOPramide **OR** metoCLOPramide (REGLAN) injection, ondansetron **OR** ondansetron (ZOFRAN) IV, sodium chloride flush  Antibiotics: Anti-infectives (From admission, onward)    Start     Dose/Rate Route Frequency Ordered Stop   07/14/23 2000  ceFAZolin (ANCEF) IVPB 2g/100 mL premix        2 g 200 mL/hr over 30 Minutes Intravenous Every 8 hours 07/14/23 1523 07/15/23 1959   07/14/23 1356  vancomycin (VANCOCIN) powder  Status:  Discontinued          As needed 07/14/23 1356 07/14/23 1426   07/14/23 1009  ceFAZolin (ANCEF) 2-4 GM/100ML-% IVPB       Note to Pharmacy: Noelle Batten M: cabinet override      07/14/23 1009 07/14/23 1215   07/14/23 0600  ceFAZolin (ANCEF) IVPB 2g/100 mL premix        2 g 200 mL/hr over 30 Minutes Intravenous On call to O.R. 07/13/23 1731 07/14/23 1240       Objective:  Vital Signs  Vitals:   07/15/23 0200 07/15/23 0400 07/15/23 0750 07/15/23 0804  BP:  102/77 99/79   Pulse: 92 94 87 100  Resp: 15 10 19 20   Temp:  98 F (36.7 C) 98.3 F (36.8 C)   TempSrc:  Oral Oral   SpO2: (!) 86% 99% 97% 96%  Weight:      Height:        Intake/Output Summary (Last 24 hours) at 07/15/2023 0943 Last data filed at 07/15/2023 0547 Gross per 24 hour  Intake 1203 ml  Output 1625 ml  Net -422 ml   Filed Weights   07/12/23 2034 07/14/23 1034  Weight: 81.6 kg (P) 81.6 kg    Awake Alert, No new F.N deficits, Normal affect [place].AT,PERRAL Supple Neck, No JVD,   Symmetrical Chest wall movement, Good air movement bilaterally, CTAB RRR,No Gallops, Rubs or new Murmurs,  +ve B.Sounds, Abd Soft, No tenderness,   No Cyanosis, left hip postop site appears  stable   Lab Results:  Data Reviewed: I have personally reviewed following labs and reports of the imaging studies  CBC: Recent Labs  Lab 07/12/23 2105 07/13/23 0544 07/14/23 0607 07/14/23 1247 07/15/23 0620  WBC 9.0 11.4* 14.0*  --  12.6*  NEUTROABS 6.9  --   --   --   --   HGB 13.6 11.8* 12.6* 12.6* 9.4*  HCT 41.4 36.0* 36.5* 37.0* 28.2*  MCV 91.6 91.8 87.3  --  91.9  PLT 215 178 172  --  139*    Basic Metabolic Panel: Recent Labs  Lab 07/12/23 2105 07/13/23 0544 07/14/23 0607 07/14/23 1247 07/15/23 0620  NA 137 133* 132* 133* 133*  K 4.4 3.9 4.5 4.4 5.4*  CL 102 101 102 103 98  CO2 23 22 19*  --  23  GLUCOSE 158* 187* 256* 193* 239*  BUN 34* 33* 34* 34* 38*  CREATININE  1.72* 1.77* 1.74* 1.70* 2.18*  CALCIUM 9.6 8.8* 9.3  --  8.9  MG  --  1.8  --   --  2.2    GFR: Estimated Creatinine Clearance: 39.1 mL/min (A) (by C-G formula based on SCr of 2.18 mg/dL (H)).  Liver Function Tests: Recent Labs  Lab 07/13/23 0544  AST 20  ALT 16  ALKPHOS 70  BILITOT 0.7  PROT 6.3*  ALBUMIN 3.1*    Coagulation Profile: Recent Labs  Lab 07/13/23 0544  INR 1.2    CBG: Recent Labs  Lab 07/14/23 1623 07/14/23 2024 07/14/23 2332 07/15/23 0343 07/15/23 0749  GLUCAP 188* 361* 265* 203* 227*     Radiology Studies: DG HIP PORT UNILAT W OR W/O PELVIS 1V LEFT Result Date: 07/14/2023 CLINICAL DATA:  Status post total left hip arthroplasty. EXAM: DG HIP (WITH OR WITHOUT PELVIS) 1V PORT LEFT COMPARISON:  Left femur radiographs 07/13/2023, left hip radiographs 07/12/2023 FINDINGS: Interval total left hip arthroplasty. No perihardware lucency is seen to indicate hardware failure or loosening. Moderate right femoroacetabular joint space narrowing and superolateral right acetabular degenerative osteophytosis. Mild bilateral sacroiliac subchondral sclerosis. The pubic symphysis joint space is maintained. Moderate to high-grade atherosclerotic calcifications are again seen.  Surgical clips overlie the inferior left hip joint. IMPRESSION: Interval total left hip arthroplasty without evidence of hardware failure. Electronically Signed   By: Bertina Broccoli M.D.   On: 07/14/2023 15:48   DG HIP UNILAT WITH PELVIS 1V LEFT Result Date: 07/14/2023 CLINICAL DATA:  Anterior total left hip arthroplasty. EXAM: DG HIP (WITH OR WITHOUT PELVIS) 1V*L* COMPARISON:  Left femur radiographs 07/13/2023 FINDINGS: Images were performed intraoperatively without the presence of a radiologist. The patient is undergoing total left hip arthroplasty. No hardware complication is seen. Total fluoroscopy images: 5 Total fluoroscopy time: 40 seconds Total dose: Radiation Exposure Index (as provided by the fluoroscopic device): 5.74 mGy air Kerma Please see intraoperative findings for further detail. IMPRESSION: Intraoperative fluoroscopy for total left hip arthroplasty. Electronically Signed   By: Bertina Broccoli M.D.   On: 07/14/2023 15:46   DG C-Arm 1-60 Min-No Report Result Date: 07/14/2023 Fluoroscopy was utilized by the requesting physician.  No radiographic interpretation.   DG C-Arm 1-60 Min-No Report Result Date: 07/14/2023 Fluoroscopy was utilized by the requesting physician.  No radiographic interpretation.   DG FEMUR MIN 2 VIEWS LEFT Result Date: 07/13/2023 CLINICAL DATA:  Hip fracture. EXAM: LEFT FEMUR 2 VIEWS COMPARISON:  Hip radiograph yesterday FINDINGS: Left femoral neck fracture with mild displacement and angulation. The distal femur is intact. No additional femur fracture. Knee alignment is maintained. Vascular calcifications are seen. IMPRESSION: Left femoral neck fracture with mild displacement and angulation. Electronically Signed   By: Chadwick Colonel M.D.   On: 07/13/2023 15:12     LOS: 3 days   Signature  -    Lynnwood Sauer M.D on 07/15/2023 at 9:43 AM   -  To page go to [email or web address]

## 2023-07-15 NOTE — Progress Notes (Signed)
 PT Cancellation Note  Patient Details Name: David [name] MRN: [id number] DOB: 1960-09-08   Cancelled Treatment:    Reason Eval/Treat Not Completed: Pain limiting ability to participate  Patient waiting for pain medication at this time. Requested PT return after pain meds. Notified pt and RN that it will be after 1:00 when I can return and hopefully pt will have had time for 2 doses of pain medication by then.    Gayle Kava, PT Acute Rehabilitation Services  Office [phone]  [place] Leep 07/15/2023, 9:34 AM

## 2023-07-15 NOTE — TOC CAGE-AID Note (Signed)
 Transition of Care [facility]) - CAGE-AID Screening  Patient Details  Name: David [name] MRN: [id number] Date of Birth: 14-Feb-1961  Clinical Narrative:  Patient denies any current alcohol or drug use, substance abuse resources not provided at this time.  CAGE-AID Screening:   Have You Ever Felt You Ought to Cut Down on Your Drinking or Drug Use?: No Have People Annoyed You By Critizing Your Drinking Or Drug Use?: No Have You Felt Bad Or Guilty About Your Drinking Or Drug Use?: No Have You Ever Had a Drink or Used Drugs First Thing In The Morning to Steady Your Nerves or to Get Rid of a Hangover?: No CAGE-AID Score: 0  Substance Abuse Education Offered: No

## 2023-07-15 NOTE — Progress Notes (Signed)
   ORTHOPAEDIC PROGRESS NOTE  s/p Procedure(s): ARTHROPLASTY, HIP, TOTAL, ANTERIOR APPROACH  SUBJECTIVE: Reports moderate to severe pain about operative site. He has been able to mobilize some. He is motivated to work with therapy.   No chest pain. No SOB. No nausea/vomiting. No other complaints.  OBJECTIVE: PE: General: resting in hospital bed, NAD LLE: incision CDI, leg lengths equal, warm well perfused foot, intact EHL/TA/GSC   Vitals:   07/15/23 0750 07/15/23 0804  BP: 99/79   Pulse: 87 100  Resp: 19 20  Temp: 98.3 F (36.8 C)   SpO2: 97% 96%     ASSESSMENT: David [name] is a 63 y.o. male POD#1  PLAN: Weightbearing: WBAT LLE Insicional and dressing care: Reinforce dressings as needed Orthopedic device(s): None Showering: Hold for now VTE prophylaxis: Heparin Pain control: PRN pain medications, minimize narcotics as able Follow - up plan: 2 weeks in office Dispo: TBD. PT/OT evals today Contact information: After hours and holidays please check [email or web address] for group call information for [facility]  Nicholas Bari, PA-C 07/15/23

## 2023-07-15 NOTE — Plan of Care (Signed)
  Problem: Education: Goal: Ability to describe self-care measures that may prevent or decrease complications (Diabetes Survival Skills Education) will improve Outcome: Progressing Goal: Individualized Educational Video(s) Outcome: Progressing   Problem: Coping: Goal: Ability to adjust to condition or change in health will improve Outcome: Progressing   Problem: Fluid Volume: Goal: Ability to maintain a balanced intake and output will improve Outcome: Progressing   Problem: Health Behavior/Discharge Planning: Goal: Ability to identify and utilize available resources and services will improve Outcome: Progressing Goal: Ability to manage health-related needs will improve Outcome: Progressing   Problem: Metabolic: Goal: Ability to maintain appropriate glucose levels will improve Outcome: Progressing   Problem: Nutritional: Goal: Maintenance of adequate nutrition will improve Outcome: Progressing Goal: Progress toward achieving an optimal weight will improve Outcome: Progressing   Problem: Skin Integrity: Goal: Risk for impaired skin integrity will decrease Outcome: Progressing   Problem: Tissue Perfusion: Goal: Adequacy of tissue perfusion will improve Outcome: Progressing   Problem: Health Behavior/Discharge Planning: Goal: Ability to manage health-related needs will improve Outcome: Progressing   Problem: Clinical Measurements: Goal: Ability to maintain clinical measurements within normal limits will improve Outcome: Progressing Goal: Will remain free from infection Outcome: Progressing Goal: Diagnostic test results will improve Outcome: Progressing Goal: Respiratory complications will improve Outcome: Progressing Goal: Cardiovascular complication will be avoided Outcome: Progressing   Problem: Activity: Goal: Risk for activity intolerance will decrease Outcome: Progressing   Problem: Nutrition: Goal: Adequate nutrition will be maintained Outcome:  Progressing   Problem: Coping: Goal: Level of anxiety will decrease Outcome: Progressing   Problem: Elimination: Goal: Will not experience complications related to bowel motility Outcome: Progressing Goal: Will not experience complications related to urinary retention Outcome: Progressing   Problem: Pain Managment: Goal: General experience of comfort will improve and/or be controlled Outcome: Progressing   Problem: Safety: Goal: Ability to remain free from injury will improve Outcome: Progressing   Problem: Skin Integrity: Goal: Risk for impaired skin integrity will decrease Outcome: Progressing   Problem: Education: Goal: Verbalization of understanding the information provided (i.e., activity precautions, restrictions, etc) will improve Outcome: Progressing Goal: Individualized Educational Video(s) Outcome: Progressing   Problem: Activity: Goal: Ability to ambulate and perform ADLs will improve Outcome: Progressing   Problem: Clinical Measurements: Goal: Postoperative complications will be avoided or minimized Outcome: Progressing   Problem: Self-Concept: Goal: Ability to maintain and perform role responsibilities to the fullest extent possible will improve Outcome: Progressing   Problem: Pain Management: Goal: Pain level will decrease Outcome: Progressing

## 2023-07-16 ENCOUNTER — Inpatient Hospital Stay (HOSPITAL_COMMUNITY)

## 2023-07-16 DIAGNOSIS — S7292XA Unspecified fracture of left femur, initial encounter for closed fracture: Secondary | ICD-10-CM | POA: Diagnosis not present

## 2023-07-16 LAB — GLUCOSE, CAPILLARY
Glucose-Capillary: 146 mg/dL — ABNORMAL HIGH (ref 70–99)
Glucose-Capillary: 151 mg/dL — ABNORMAL HIGH (ref 70–99)
Glucose-Capillary: 209 mg/dL — ABNORMAL HIGH (ref 70–99)
Glucose-Capillary: 181 mg/dL — ABNORMAL HIGH (ref 70–99)
Glucose-Capillary: 185 mg/dL — ABNORMAL HIGH (ref 70–99)
Glucose-Capillary: 158 mg/dL — ABNORMAL HIGH (ref 70–99)

## 2023-07-16 LAB — CBC
HCT: 25.9 % — ABNORMAL LOW (ref 39.0–52.0)
Hemoglobin: 8.6 g/dL — ABNORMAL LOW (ref 13.0–17.0)
MCH: 30.1 pg (ref 26.0–34.0)
MCHC: 33.2 g/dL (ref 30.0–36.0)
MCV: 90.6 fL (ref 80.0–100.0)
Platelets: 115 10*3/uL — ABNORMAL LOW (ref 150–400)
RBC: 2.86 MIL/uL — ABNORMAL LOW (ref 4.22–5.81)
RDW: 14.4 % (ref 11.5–15.5)
WBC: 7.1 10*3/uL (ref 4.0–10.5)
nRBC: 0.3 % — ABNORMAL HIGH (ref 0.0–0.2)

## 2023-07-16 LAB — TSH: TSH: 2.335 u[IU]/mL (ref 0.350–4.500)

## 2023-07-16 LAB — MAGNESIUM: Magnesium: 2.1 mg/dL (ref 1.7–2.4)

## 2023-07-16 LAB — BASIC METABOLIC PANEL WITH GFR
Anion gap: 12 (ref 5–15)
BUN: 40 mg/dL — ABNORMAL HIGH (ref 8–23)
CO2: 21 mmol/L — ABNORMAL LOW (ref 22–32)
Calcium: 8.5 mg/dL — ABNORMAL LOW (ref 8.9–10.3)
Chloride: 97 mmol/L — ABNORMAL LOW (ref 98–111)
Creatinine, Ser: 2.03 mg/dL — ABNORMAL HIGH (ref 0.61–1.24)
GFR, Estimated: 37 mL/min — ABNORMAL LOW (ref >60)
Glucose, Bld: 147 mg/dL — ABNORMAL HIGH (ref 70–99)
Potassium: 4.2 mmol/L (ref 3.5–5.1)
Sodium: 130 mmol/L — ABNORMAL LOW (ref 135–145)

## 2023-07-16 LAB — CORTISOL: Cortisol, Plasma: 9.7 ug/dL

## 2023-07-16 MED ORDER — ACETAMINOPHEN 500 MG PO TABS
500.0000 mg | ORAL_TABLET | Freq: Three times a day (TID) | ORAL | Status: DC
  Administered 2023-07-16 – 2023-07-20 (×14): 500 mg via ORAL
  Filled 2023-07-16 (×15): qty 1

## 2023-07-16 MED ORDER — CARVEDILOL 3.125 MG PO TABS
3.1250 mg | ORAL_TABLET | Freq: Two times a day (BID) | ORAL | Status: DC
  Administered 2023-07-17 – 2023-07-21 (×9): 3.125 mg via ORAL
  Filled 2023-07-16 (×10): qty 1

## 2023-07-16 MED ORDER — SENNOSIDES-DOCUSATE SODIUM 8.6-50 MG PO TABS
2.0000 | ORAL_TABLET | Freq: Two times a day (BID) | ORAL | Status: DC
  Administered 2023-07-16 – 2023-07-21 (×10): 2 via ORAL
  Filled 2023-07-16 (×11): qty 2

## 2023-07-16 MED ORDER — LACTATED RINGERS IV SOLN: INTRAVENOUS | Status: DC

## 2023-07-16 NOTE — Progress Notes (Addendum)
 [facility] PROGRESS NOTE   JIOVANNY BURDELL [name]:[id number] DOB: [birth year] DOA: 07/12/2023  PCP: [name], MD  Brief History: 63 y.o. male with medical history significant of chronic atrial fibrillation, heart failure with reduced ejection fraction, essential hypertension, CABG in 04/2023, CKD stage IIIb, non-insulin-dependent DM type II, and hyperlipidemia, spinal surgery for infection last year who had a mechanical fall at home resulting in pain in the left hip area.  Found to have left hip fracture.  Hospitalized for further management.   Consultants: Cardiology.  Orthopedics  Procedures: None yet  Subjective/Interval History: Patient in bed, appears comfortable, denies any headache, no fever, no chest pain or pressure, no shortness of breath , no abdominal pain. No focal weakness.   Assessment/Plan:  Left hip fracture This was secondary to mechanical fall. Orthopedics has been consulted, preop clearance provided by cardiology, Eliquis is on hold resume once cleared by orthopedic surgery Surgery due on 07/14/2023, patient agreeable for any perioperative blood transfusion, per orthopedic surgery weightbearing as tolerated, resume Eliquis once cleared by orthopedics, follow-up with Dr. [name] in 2 weeks post discharge.  Coronary artery disease status post CABG in January 2025 Apparently patient has not been taking aspirin for unknown reasons.  Eliquis currently on hold.  Will resume once cleared by orthopedics, have informed orthopedics to address. Continue carvedilol as tolerated by blood pressure and heart rate and statin.   Appreciate cardiology input.  Anemia.  Likely due to combination of perioperative blood loss along with some heme dilution from IV fluids, no signs of ongoing bleeding, monitor.  Agreeable for transfusion if needed.  Permanent atrial fibrillation Went into RVR was briefly placed on amiodarone drip now on oral amnio, blood pressure low hence Coreg dose  had to be dropped, Eliquis as above.  Hypertension.  Blood pressure on the softer side.  Cut back Coreg, hold Lasix on 07/15/2023, gentle IV fluids, minimize narcotic use and monitor.  Patient counseled.    History of back surgery.  Supportive care.  Chronically on narcotics.    Chronic pain.  Takes high doses of narcotics chronically, blood pressure running soft counseled not to overuse narcotics.    Chronic systolic CHF with recovered EF Echocardiogram from January showed LVEF of 65 to 70%.  No evidence for volume overload currently.  Continue beta-blocker as tolerated by blood pressure and heart rate.  Dose Lasix daily.  AKI on chronic kidney disease stage IIIb Continued baseline around 1.7, hold Lasix,  gentle hydration with IV fluids on 07/15/2023 and 07/16/2023, hold beta-blocker, avoid hypotension.  Hyperlipidemia Continue statin.  Peripheral neuropathy Continue Lyrica.  Hyperkalemia.  Received Lokelma.    History of BPH Continue Flomax  Insulin-dependent diabetes mellitus type 2 HbA1c 7.4 in January.  On basal insulin at home.  Continue glargine and SSI.,  Sliding scale changed to every 4 while NPO.  Lab Results  Component Value Date   HGBA1C 7.4 (H) 04/09/2023    CBG (last 3)  Recent Labs    07/15/23 2337 07/16/23 0334 07/16/23 0829  GLUCAP 140* 146* 151*     DVT Prophylaxis: Apixaban on hold.  SCDs for now.  Definitive prophylaxis after surgery Code Status: Full code Family Communication: Discussed with patient Disposition Plan: To be determined  Status is: Inpatient Remains inpatient appropriate because: Left hip fracture, atrial fibrillation  Medications: Scheduled:  acetaminophen  650 mg Oral Q8H   amiodarone  200 mg Oral BID   aspirin EC  81 mg Oral Daily   carvedilol  3.125 mg Oral BID WC   dapagliflozin propanediol  10 mg Oral QAC breakfast   docusate sodium  100 mg Oral BID   heparin injection (subcutaneous)  5,000 Units Subcutaneous Q8H    HYDROcodone-acetaminophen  1 tablet Oral Q6H   insulin aspart  0-15 Units Subcutaneous Q4H   insulin glargine-yfgn  18 Units Subcutaneous Daily   oxyCODONE  10 mg Oral Q12H   polyethylene glycol  17 g Oral BID   pregabalin  75 mg Oral BID   rosuvastatin  20 mg Oral Daily   senna-docusate  2 tablet Oral BID   tamsulosin  0.4 mg Oral QPC supper   Continuous:  lactated ringers     WUJ:WJXBJYNWGN, diltiazem, hydrALAZINE, HYDROmorphone (DILAUDID) injection, methocarbamol, [DISCONTINUED] ondansetron **OR** ondansetron (ZOFRAN) IV  Objective:  Vital Signs  Vitals:   07/16/23 0000 07/16/23 0400 07/16/23 0745 07/16/23 0928  BP: 100/69 101/64 102/60 (!) 95/56  Pulse: 94 89  82  Resp: 17 16 (!) 23 18  Temp: 98.9 F (37.2 C) 99.6 F (37.6 C)  98.4 F (36.9 C)  TempSrc: Oral Oral  Oral  SpO2: 99% 97% 94% 98%  Weight:      Height:        Intake/Output Summary (Last 24 hours) at 07/16/2023 0939 Last data filed at 07/16/2023 0350 Gross per 24 hour  Intake 360 ml  Output 3199 ml  Net -2839 ml   Filed Weights   07/12/23 2034 07/14/23 1034  Weight: 81.6 kg (P) 81.6 kg    Awake Alert, No new F.N deficits, Normal affect [place].AT,PERRAL Supple Neck, No JVD,   Symmetrical Chest wall movement, Good air movement bilaterally, CTAB RRR,No Gallops, Rubs or new Murmurs,  +ve B.Sounds, Abd Soft, No tenderness,   No Cyanosis, left hip postop site appears stable   Lab Results:  Data Reviewed: I have personally reviewed following labs and reports of the imaging studies  CBC: Recent Labs  Lab 07/12/23 2105 07/13/23 0544 07/14/23 0607 07/14/23 1247 07/15/23 0620 07/16/23 0656  WBC 9.0 11.4* 14.0*  --  12.6* 7.1  NEUTROABS 6.9  --   --   --   --   --   HGB 13.6 11.8* 12.6* 12.6* 9.4* 8.6*  HCT 41.4 36.0* 36.5* 37.0* 28.2* 25.9*  MCV 91.6 91.8 87.3  --  91.9 90.6  PLT 215 178 172  --  139* 115*    Basic Metabolic Panel: Recent Labs  Lab 07/12/23 2105 07/13/23 0544 07/14/23 0607  07/14/23 1247 07/15/23 0620 07/16/23 0656  NA 137 133* 132* 133* 133* 130*  K 4.4 3.9 4.5 4.4 5.4* 4.2  CL 102 101 102 103 98 97*  CO2 23 22 19*  --  23 21*  GLUCOSE 158* 187* 256* 193* 239* 147*  BUN 34* 33* 34* 34* 38* 40*  CREATININE 1.72* 1.77* 1.74* 1.70* 2.18* 2.03*  CALCIUM 9.6 8.8* 9.3  --  8.9 8.5*  MG  --  1.8  --   --  2.2 2.1    GFR: Estimated Creatinine Clearance: 41.9 mL/min (A) (by C-G formula based on SCr of 2.03 mg/dL (H)).  Liver Function Tests: Recent Labs  Lab 07/13/23 0544  AST 20  ALT 16  ALKPHOS 70  BILITOT 0.7  PROT 6.3*  ALBUMIN 3.1*    Coagulation Profile: Recent Labs  Lab 07/13/23 0544  INR 1.2    CBG: Recent Labs  Lab 07/15/23 1602 07/15/23 1945 07/15/23 2337 07/16/23 0334 07/16/23 0829  GLUCAP 187* 161*  140* 146* 151*     Radiology Studies: DG Chest Port 1 View Result Date: 07/16/2023 CLINICAL DATA:  Shortness of breath EXAM: PORTABLE CHEST 1 VIEW COMPARISON:  07/12/2023 FINDINGS: Cardiomegaly which is stable. Prior CABG and left atrial clipping. Stable congested appearance of vessels without Kerley lines, air bronchogram, effusion, or pneumothorax. IMPRESSION: Chronic cardiomegaly and vascular congestion. Electronically Signed   By: Ronnette Coke M.D.   On: 07/16/2023 07:36   DG HIP PORT UNILAT W OR W/O PELVIS 1V LEFT Result Date: 07/14/2023 CLINICAL DATA:  Status post total left hip arthroplasty. EXAM: DG HIP (WITH OR WITHOUT PELVIS) 1V PORT LEFT COMPARISON:  Left femur radiographs 07/13/2023, left hip radiographs 07/12/2023 FINDINGS: Interval total left hip arthroplasty. No perihardware lucency is seen to indicate hardware failure or loosening. Moderate right femoroacetabular joint space narrowing and superolateral right acetabular degenerative osteophytosis. Mild bilateral sacroiliac subchondral sclerosis. The pubic symphysis joint space is maintained. Moderate to high-grade atherosclerotic calcifications are again seen.  Surgical clips overlie the inferior left hip joint. IMPRESSION: Interval total left hip arthroplasty without evidence of hardware failure. Electronically Signed   By: Bertina Broccoli M.D.   On: 07/14/2023 15:48   DG HIP UNILAT WITH PELVIS 1V LEFT Result Date: 07/14/2023 CLINICAL DATA:  Anterior total left hip arthroplasty. EXAM: DG HIP (WITH OR WITHOUT PELVIS) 1V*L* COMPARISON:  Left femur radiographs 07/13/2023 FINDINGS: Images were performed intraoperatively without the presence of a radiologist. The patient is undergoing total left hip arthroplasty. No hardware complication is seen. Total fluoroscopy images: 5 Total fluoroscopy time: 40 seconds Total dose: Radiation Exposure Index (as provided by the fluoroscopic device): 5.74 mGy air Kerma Please see intraoperative findings for further detail. IMPRESSION: Intraoperative fluoroscopy for total left hip arthroplasty. Electronically Signed   By: Bertina Broccoli M.D.   On: 07/14/2023 15:46   DG C-Arm 1-60 Min-No Report Result Date: 07/14/2023 Fluoroscopy was utilized by the requesting physician.  No radiographic interpretation.   DG C-Arm 1-60 Min-No Report Result Date: 07/14/2023 Fluoroscopy was utilized by the requesting physician.  No radiographic interpretation.     LOS: 4 days   Signature  -    Lynnwood Sauer M.D on 07/16/2023 at 9:39 AM   -  To page go to [email or web address]

## 2023-07-16 NOTE — Plan of Care (Signed)

## 2023-07-16 NOTE — Progress Notes (Addendum)
 Patient not fully emptying bladder with voiding per bladder scans.   MD Zelda Hickman advised if bladder scans post void greater than 300 mL a foley catheter would be indicated.   Patient was assisted up to [facility] to attempt for BM, no BM but pt did  void 325 in BSC.   Post post void bladder scan showed 360 mL still in bladder.   David [name] is fairly emotional about his current situation with having multiple surgeries in the past year and feeling like things just keep happening. he stated he felt it was hard to void in [facility] and if he was standing up he could have emptied bladder with no issues. RN offered to assist him back to standing and to try and void again but he stated the current transfer to and from [facility] was a bit much to do it all over again right now when he just got back into bed and comfortable.   Pt agreeable to foley but requested it be held until after the next time he voids when he will request to stand and see if he can fully empty bladder.   RN discussed with MD Zelda Hickman and MD advised okay to wait 2 hours and assist patient with voiding and reevaluate post void residual to determine if foley required.    1840 -Pt stood and voided 400 mL. Post void scan showed 220 mL residual.  Patient advised RN he is agreeable to a foley if it is truly indicated but he would prefer to not have a foley placed and continue with current plan and standing to void if this is an option.  MD Zelda Hickman notified.

## 2023-07-16 NOTE — Progress Notes (Signed)
   ORTHOPAEDIC PROGRESS NOTE  s/p Procedure(s): ARTHROPLASTY, HIP, TOTAL, ANTERIOR APPROACH  SUBJECTIVE: Reports moderate to severe pain about operative site. He has been able to mobilize some. He has not had a BM in 3 days. Pain better controlled after increasing regimen.   No chest pain. No SOB. No nausea/vomiting. No other complaints.  OBJECTIVE: PE: General: resting in hospital bed, NAD LLE: incision CDI, leg lengths equal, warm well perfused foot, intact EHL/TA/GSC   Vitals:   07/16/23 0400 07/16/23 0745  BP: 101/64 102/60  Pulse: 89   Resp: 16 (!) 23  Temp: 99.6 F (37.6 C)   SpO2: 97% 94%     ASSESSMENT: David [name] is a 63 y.o. male POD#1  PLAN: Weightbearing: WBAT LLE Insicional and dressing care: Reinforce dressings as needed Orthopedic device(s): None Showering: Hold for now VTE prophylaxis: Heparin Pain control: PRN pain medications, minimize narcotics as able Follow - up plan: 2 weeks in office Dispo: TBD. PT/OT evals today Contact information: After hours and holidays please check [email or web address] for group call information for [facility]

## 2023-07-16 NOTE — Progress Notes (Signed)

## 2023-07-16 NOTE — Progress Notes (Signed)
 Physical Therapy Treatment Patient Details Name: David DOMINE MRN: [id number] DOB: [birth year] Today's Date: 07/16/2023   History of Present Illness 63 yo presented 07/12/23 after fall landing on left hip on the side of the tub. 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    PT Comments  Patient pre-medicated with same meds as when seen 4/12, however pt reporting worse pain and dizziness with ambulation. Able to ambulate 60 ft with min assist (to control RW as pt fatigued and he began to push it too far ahead of his body). Patient states there is no way he can go straight home and is asking for AIR to consider him. Contacted Wandalee Gust from Rehab Admissions and she reported his insurance might approve an AIR stay. She will place consult. If he is not able to, he would benefit from inpatient therapy <3 hrs/day and has agreed to this venue if needed.     If plan is discharge home, recommend the following: A little help with walking and/or transfers;A little help with bathing/dressing/bathroom;Assistance with cooking/housework;Assist for transportation;Help with stairs or ramp for entrance   Can travel by private vehicle        Equipment Recommendations  None recommended by PT    Recommendations for Other Services Rehab consult (pt request)     Precautions / Restrictions Precautions Precautions: Anterior Hip Restrictions Weight Bearing Restrictions Per Provider Order: Yes LLE Weight Bearing Per Provider Order: Weight bearing as tolerated     Mobility  Bed Mobility Overal bed mobility: Needs Assistance Bed Mobility: Supine to Sit, Sit to Supine     Supine to sit: Supervision, HOB elevated, Used rails Sit to supine: Min assist   General bed mobility comments: incr time and effort, but no physical assist to come to sit; assist to  raise LLE onto bed on return    Transfers Overall transfer level: Needs assistance Equipment used: Rolling walker (2 wheels) Transfers: Sit to/from Stand Sit to Stand: Min assist           General transfer comment: successful on 2nd attempt with min assist    Ambulation/Gait Ambulation/Gait assistance: Min assist Gait Distance (Feet): 60 Feet Assistive device: Rolling walker (2 wheels) Gait Pattern/deviations: Step-through pattern, Decreased stride length, Antalgic, Trunk flexed   Gait velocity interpretation: <1.8 ft/sec, indicate of risk for recurrent falls   General Gait Details: as pt fatigued he began to flex trunk and arms and push RW too far ahead; assist to manage RW with mod cues for upright posture, pushing down thru UEs   Stairs             Wheelchair Mobility     Tilt Bed    Modified Rankin (Stroke Patients Only)       Balance Overall balance assessment: Needs assistance Sitting-balance support: No upper extremity supported, Feet supported Sitting balance-Leahy Scale: Good     Standing balance support: Single extremity supported Standing balance-Leahy Scale: Fair                              Hotel manager: No apparent difficulties  Cognition Arousal: Alert Behavior During Therapy: WFL for tasks assessed/performed   PT - Cognitive impairments: No apparent impairments  Following commands: Intact      Cueing Cueing Techniques: Verbal cues  Exercises Total Joint Exercises Ankle Circles/Pumps: AROM, Both, 10 reps Heel Slides: AAROM, Left, Other reps (comment) (3)    General Comments General comments (skin integrity, edema, etc.): VSS per monitor. Pt reporting too dizzy after pain meds and walking to go to sit up in recliner and returned to bed. Pain meds today were same as 4/12 with pt reporting less effective and more dizzy      Pertinent Vitals/Pain Pain  Assessment Pain Assessment: 0-10 Pain Score: 8  Pain Location: left hip Pain Descriptors / Indicators: Operative site guarding Pain Intervention(s): Limited activity within patient's tolerance, Monitored during session, Premedicated before session, Repositioned    Home Living                          Prior Function            PT Goals (current goals can now be found in the care plan section) Acute Rehab PT Goals Patient Stated Goal: go to AIR and then home Time For Goal Achievement: 07/29/23 Potential to Achieve Goals: Good Progress towards PT goals: Progressing toward goals    Frequency    Min 3X/week      PT Plan      Co-evaluation              AM-PAC PT "6 Clicks" Mobility   Outcome Measure  Help needed turning from your back to your side while in a flat bed without using bedrails?: A Little Help needed moving from lying on your back to sitting on the side of a flat bed without using bedrails?: A Little Help needed moving to and from a bed to a chair (including a wheelchair)?: A Little Help needed standing up from a chair using your arms (e.g., wheelchair or bedside chair)?: A Little Help needed to walk in hospital room?: A Little Help needed climbing 3-5 steps with a railing? : A Lot 6 Click Score: 17    End of Session Equipment Utilized During Treatment: Gait belt Activity Tolerance: Patient limited by pain Patient left: with call bell/phone within reach;in bed   PT Visit Diagnosis: Other abnormalities of gait and mobility (R26.89);Pain Pain - Right/Left: Left Pain - part of body: Hip     Time: 1211-1238 PT Time Calculation (min) (ACUTE ONLY): 27 min  Charges:    $Gait Training: 23-37 mins PT General Charges $$ ACUTE PT VISIT: 1 Visit                      Gayle Kava, PT Acute Rehabilitation Services  Office [phone]    Guilford Leep 07/16/2023, 12:53 PM

## 2023-07-17 ENCOUNTER — Encounter (HOSPITAL_COMMUNITY): Payer: Self-pay | Admitting: Student

## 2023-07-17 DIAGNOSIS — S7292XA Unspecified fracture of left femur, initial encounter for closed fracture: Secondary | ICD-10-CM | POA: Diagnosis not present

## 2023-07-17 LAB — GLUCOSE, CAPILLARY
Glucose-Capillary: 150 mg/dL — ABNORMAL HIGH (ref 70–99)
Glucose-Capillary: 181 mg/dL — ABNORMAL HIGH (ref 70–99)
Glucose-Capillary: 138 mg/dL — ABNORMAL HIGH (ref 70–99)
Glucose-Capillary: 126 mg/dL — ABNORMAL HIGH (ref 70–99)
Glucose-Capillary: 242 mg/dL — ABNORMAL HIGH (ref 70–99)
Glucose-Capillary: 243 mg/dL — ABNORMAL HIGH (ref 70–99)

## 2023-07-17 LAB — VITAMIN D 25 HYDROXY (VIT D DEFICIENCY, FRACTURES): Vit D, 25-Hydroxy: 15.46 ng/mL — ABNORMAL LOW (ref 30–100)

## 2023-07-17 LAB — BASIC METABOLIC PANEL WITH GFR
Anion gap: 9 (ref 5–15)
BUN: 41 mg/dL — ABNORMAL HIGH (ref 8–23)
CO2: 19 mmol/L — ABNORMAL LOW (ref 22–32)
Calcium: 8.3 mg/dL — ABNORMAL LOW (ref 8.9–10.3)
Chloride: 103 mmol/L (ref 98–111)
Creatinine, Ser: 1.69 mg/dL — ABNORMAL HIGH (ref 0.61–1.24)
GFR, Estimated: 46 mL/min — ABNORMAL LOW (ref >60)
Glucose, Bld: 146 mg/dL — ABNORMAL HIGH (ref 70–99)
Potassium: 3.8 mmol/L (ref 3.5–5.1)
Sodium: 131 mmol/L — ABNORMAL LOW (ref 135–145)

## 2023-07-17 LAB — CBC
HCT: 23.1 % — ABNORMAL LOW (ref 39.0–52.0)
Hemoglobin: 7.9 g/dL — ABNORMAL LOW (ref 13.0–17.0)
MCH: 30.4 pg (ref 26.0–34.0)
MCHC: 34.2 g/dL (ref 30.0–36.0)
MCV: 88.8 fL (ref 80.0–100.0)
Platelets: 111 10*3/uL — ABNORMAL LOW (ref 150–400)
RBC: 2.6 MIL/uL — ABNORMAL LOW (ref 4.22–5.81)
RDW: 14.4 % (ref 11.5–15.5)
WBC: 7.3 10*3/uL (ref 4.0–10.5)
nRBC: 0.4 % — ABNORMAL HIGH (ref 0.0–0.2)

## 2023-07-17 LAB — MAGNESIUM: Magnesium: 2.1 mg/dL (ref 1.7–2.4)

## 2023-07-17 MED ORDER — DOXYCYCLINE HYCLATE 100 MG PO TABS
100.0000 mg | ORAL_TABLET | Freq: Two times a day (BID) | ORAL | Status: DC
  Administered 2023-07-17 – 2023-07-21 (×9): 100 mg via ORAL
  Filled 2023-07-17 (×9): qty 1

## 2023-07-17 MED ORDER — NALOXEGOL OXALATE 25 MG PO TABS
25.0000 mg | ORAL_TABLET | Freq: Every day | ORAL | Status: AC
  Administered 2023-07-17 – 2023-07-19 (×3): 25 mg via ORAL
  Filled 2023-07-17 (×3): qty 1

## 2023-07-17 MED ORDER — LACTULOSE 10 GM/15ML PO SOLN
30.0000 g | Freq: Two times a day (BID) | ORAL | Status: DC
  Administered 2023-07-17: 30 g via ORAL
  Filled 2023-07-17: qty 60

## 2023-07-17 MED ORDER — APIXABAN 5 MG PO TABS
5.0000 mg | ORAL_TABLET | Freq: Two times a day (BID) | ORAL | Status: DC
  Administered 2023-07-17 – 2023-07-21 (×9): 5 mg via ORAL
  Filled 2023-07-17 (×9): qty 1

## 2023-07-17 MED ORDER — DOCUSATE SODIUM 100 MG PO CAPS
200.0000 mg | ORAL_CAPSULE | Freq: Two times a day (BID) | ORAL | Status: DC
  Administered 2023-07-17 – 2023-07-21 (×6): 200 mg via ORAL
  Filled 2023-07-17 (×9): qty 2

## 2023-07-17 NOTE — Progress Notes (Signed)
 Inpatient Rehab Coordinator Note:  I met with patient at bedside to discuss CIR recommendations and goals/expectations of CIR stay.  We reviewed 3 hrs/day of therapy, physician follow up, and average length of stay 2 weeks (dependent upon progress) with goals of Mod I.  I attempted to call spouse. Left message. Will await call back to determine if patient will have necessary assistance at home upon discharge. Continue to follow.   Rehab Admissons Coordinator Lylla Eifler, [place], [place] [phone]

## 2023-07-17 NOTE — TOC Progression Note (Signed)
 Transition of Care [facility]) - Progression Note    Patient Details  Name: David [name] MRN: [id number] Date of Birth: 1960-08-29  Transition of Care [facility] CM/SW Contact  Jannine Meo, RN Phone Number: 07/17/2023, 12:24 PM  Clinical Narrative:   Per PT/OT recs CIR needed. Per CIR note, pt is candidate, awaiting full CIR evaluation.         Expected Discharge Plan and Services                                               Social Determinants of Health (SDOH) Interventions SDOH Screenings   Food Insecurity: Patient Declined (07/13/2023)  Recent Concern: Food Insecurity - Food Insecurity Present (05/28/2023)  Housing: Patient Declined (07/13/2023)  Recent Concern: Housing - High Risk (05/28/2023)  Transportation Needs: Patient Declined (07/13/2023)  Recent Concern: Transportation Needs - Unmet Transportation Needs (05/28/2023)  Utilities: Not At Risk (04/10/2023)  Alcohol Screen: Low Risk  (03/01/2023)  Depression (PHQ2-9): Medium Risk (06/22/2023)  Financial Resource Strain: High Risk (05/28/2023)  Physical Activity: Insufficiently Active (05/28/2023)  Social Connections: Unknown (05/28/2023)  Stress: Stress Concern Present (05/28/2023)  Tobacco Use: Medium Risk (07/14/2023)    Readmission Risk Interventions    09/06/2021   12:07 PM  Readmission Risk Prevention Plan  Post Dischage Appt Complete  Medication Screening Complete  Transportation Screening Complete

## 2023-07-17 NOTE — Care Management (Signed)
 Post void residual check revealed 966 ml, physician messaged and said ok to do foley catheter 14 foley placed with 900 ml of clear yellow urine returned, patient tolerated with minimal complaints.

## 2023-07-17 NOTE — Plan of Care (Signed)
  Problem: Education: Goal: Ability to describe self-care measures that may prevent or decrease complications (Diabetes Survival Skills Education) will improve 07/17/2023 0707 by Alvia Awkward, RN Outcome: Progressing 07/17/2023 0707 by Alvia Awkward, RN Outcome: Progressing Goal: Individualized Educational Video(s) Outcome: Progressing   Problem: Coping: Goal: Ability to adjust to condition or change in health will improve 07/17/2023 0707 by Alvia Awkward, RN Outcome: Progressing 07/17/2023 0707 by Alvia Awkward, RN Outcome: Progressing   Problem: Nutritional: Goal: Maintenance of adequate nutrition will improve Outcome: Progressing

## 2023-07-17 NOTE — Progress Notes (Addendum)
 [facility] PROGRESS NOTE   David [name] ZOX:[id number] DOB: 1960-06-01 DOA: 07/12/2023  PCP: [name], MD  Brief History: 63 y.o. male with medical history significant of chronic atrial fibrillation, heart failure with reduced ejection fraction, essential hypertension, CABG in 04/2023, CKD stage IIIb, non-insulin-dependent DM type II, and hyperlipidemia, spinal surgery for infection last year who had a mechanical fall at home resulting in pain in the left hip area.  Found to have left hip fracture.  Hospitalized for further management.   Consultants: Cardiology.  Orthopedics  Procedures:   Left total hip arthoplasty for femoral neck fracture by Dr. Jena Gauss on 07/14/2023  Subjective - Patient in bed, appears comfortable, denies any headache, no fever, no chest pain or pressure, no shortness of breath , no abdominal pain. No new focal weakness.  Assessment/Plan:  Left hip fracture  This was secondary to mechanical fall. Orthopedics has been consulted, preop clearance provided by cardiology, status post total hip arthroplasty by Dr. Jena Gauss on 07/14/2022, tolerated the procedure well with minimal perioperative blood loss, agreeable for transfusion.  Weightbearing as tolerated, Eliquis for DVT prophylaxis to be started 07/17/2023.  May require SNF.  Discharge follow-up with Dr. Jena Gauss.  Coronary artery disease status post CABG in January 2025 For now continue Eliquis, outpatient follow-up with her cardiologist to see if he needs additional aspirin, he was not taking aspirin prior to admission Continue carvedilol as tolerated by blood pressure and heart rate and statin.   Appreciate cardiology input.  Anemia.  Likely due to combination of perioperative blood loss along with some heme dilution from IV fluids, no signs of ongoing bleeding, monitor.  Agreeable for transfusion if needed.  Permanent atrial fibrillation Went into RVR was briefly placed on amiodarone drip now on oral  amnio, blood pressure low hence Coreg dose had to be dropped, Eliquis as above.  Hypertension.  Blood pressure on the softer side.  Cut back Coreg, hold Lasix on 07/15/2023, gentle IV fluids, minimize narcotic use and monitor.  Patient counseled.    History of back surgery.  Supportive care.  Chronically on narcotics.    Chronic pain.  Takes high doses of narcotics chronically, blood pressure running soft counseled not to overuse narcotics.    Chronic systolic CHF with recovered EF Echocardiogram from January showed LVEF of 65 to 70%.  No evidence for volume overload currently.  Continue beta-blocker as tolerated by blood pressure and heart rate.  Dose Lasix daily.  AKI on chronic kidney disease stage IIIb Continued baseline around 1.7, improved after holding Lasix and gentle IV fluids, avoid hypotension.  Hyperlipidemia Continue statin.  Peripheral neuropathy Continue Lyrica.  Hyperkalemia.  Received Lokelma.    History of BPH with urinary retention post hip surgery Continue Flomax, Foley placed 07/16/2023 after multiple episodes of urinary retention in excess of 500 cc will be discharged with Foley catheter with outpatient urology follow-up.  Insulin-dependent diabetes mellitus type 2 HbA1c 7.4 in January.  On basal insulin at home.  Continue glargine and SSI.,  Sliding scale changed to every 4 while NPO.  Lab Results  Component Value Date   HGBA1C 7.4 (H) 04/09/2023    CBG (last 3)  Recent Labs    07/16/23 2322 07/17/23 0432 07/17/23 0843  GLUCAP 158* 150* 181*     DVT Prophylaxis: Apixaban on hold.  SCDs for now.  Definitive prophylaxis after surgery Code Status: Full code Family Communication: Discussed with patient Disposition Plan: To be determined  Status is: Inpatient Remains inpatient  appropriate because: Left hip fracture, atrial fibrillation  Medications: Scheduled:  acetaminophen  500 mg Oral Q8H   amiodarone  200 mg Oral BID   carvedilol  3.125 mg  Oral BID WC   dapagliflozin propanediol  10 mg Oral QAC breakfast   docusate sodium  200 mg Oral BID   heparin injection (subcutaneous)  5,000 Units Subcutaneous Q8H   HYDROcodone-acetaminophen  1 tablet Oral Q6H   insulin aspart  0-15 Units Subcutaneous Q4H   insulin glargine-yfgn  18 Units Subcutaneous Daily   lactulose  30 g Oral BID   naloxegol oxalate  25 mg Oral Daily   oxyCODONE  10 mg Oral Q12H   polyethylene glycol  17 g Oral BID   pregabalin  75 mg Oral BID   rosuvastatin  20 mg Oral Daily   senna-docusate  2 tablet Oral BID   tamsulosin  0.4 mg Oral QPC supper   Continuous:   JXB:JYNWGNFAOZ, diltiazem, hydrALAZINE, HYDROmorphone (DILAUDID) injection, methocarbamol, [DISCONTINUED] ondansetron **OR** ondansetron (ZOFRAN) IV  Objective:  Vital Signs  Vitals:   07/16/23 1642 07/16/23 2030 07/17/23 0356 07/17/23 0842  BP: 107/72  121/73 116/79  Pulse: 84 84  82  Resp: 18   15  Temp:  98.2 F (36.8 C) 97.9 F (36.6 C) 99.1 F (37.3 C)  TempSrc:  Oral Oral Oral  SpO2: 90% 96%  97%  Weight:      Height:        Intake/Output Summary (Last 24 hours) at 07/17/2023 1043 Last data filed at 07/17/2023 0500 Gross per 24 hour  Intake 1527.54 ml  Output 4275 ml  Net -2747.46 ml   Filed Weights   07/12/23 2034 07/14/23 1034  Weight: 81.6 kg (P) 81.6 kg    Awake Alert, No new F.N deficits, Normal affect [place].AT,PERRAL Supple Neck, No JVD,   Symmetrical Chest wall movement, Good air movement bilaterally, CTAB iRRR,No Gallops, Rubs or new Murmurs,  +ve B.Sounds, Abd Soft, No tenderness,   No Cyanosis, left hip postop site appears stable, foley   Lab Results:  Data Reviewed: I have personally reviewed following labs and reports of the imaging studies  CBC: Recent Labs  Lab 07/12/23 2105 07/13/23 0544 07/14/23 0607 07/14/23 1247 07/15/23 0620 07/16/23 0656 07/17/23 0442  WBC 9.0 11.4* 14.0*  --  12.6* 7.1 7.3  NEUTROABS 6.9  --   --   --   --   --   --    HGB 13.6 11.8* 12.6* 12.6* 9.4* 8.6* 7.9*  HCT 41.4 36.0* 36.5* 37.0* 28.2* 25.9* 23.1*  MCV 91.6 91.8 87.3  --  91.9 90.6 88.8  PLT 215 178 172  --  139* 115* 111*    Basic Metabolic Panel: Recent Labs  Lab 07/13/23 0544 07/14/23 0607 07/14/23 1247 07/15/23 0620 07/16/23 0656 07/17/23 0442  NA 133* 132* 133* 133* 130* 131*  K 3.9 4.5 4.4 5.4* 4.2 3.8  CL 101 102 103 98 97* 103  CO2 22 19*  --  23 21* 19*  GLUCOSE 187* 256* 193* 239* 147* 146*  BUN 33* 34* 34* 38* 40* 41*  CREATININE 1.77* 1.74* 1.70* 2.18* 2.03* 1.69*  CALCIUM 8.8* 9.3  --  8.9 8.5* 8.3*  MG 1.8  --   --  2.2 2.1 2.1    GFR: Estimated Creatinine Clearance: 50.4 mL/min (A) (by C-G formula based on SCr of 1.69 mg/dL (H)).  Liver Function Tests: Recent Labs  Lab 07/13/23 0544  AST 20  ALT 16  ALKPHOS 70  BILITOT 0.7  PROT 6.3*  ALBUMIN 3.1*    Coagulation Profile: Recent Labs  Lab 07/13/23 0544  INR 1.2    CBG: Recent Labs  Lab 07/16/23 1852 07/16/23 2027 07/16/23 2322 07/17/23 0432 07/17/23 0843  GLUCAP 181* 185* 158* 150* 181*   Radiology Studies: DG Chest Port 1 View Result Date: 07/16/2023 CLINICAL DATA:  Shortness of breath EXAM: PORTABLE CHEST 1 VIEW COMPARISON:  07/12/2023 FINDINGS: Cardiomegaly which is stable. Prior CABG and left atrial clipping. Stable congested appearance of vessels without Kerley lines, air bronchogram, effusion, or pneumothorax. IMPRESSION: Chronic cardiomegaly and vascular congestion. Electronically Signed   By: Ronnette Coke M.D.   On: 07/16/2023 07:36     LOS: 5 days   Signature  -    Lynnwood Sauer M.D on 07/17/2023 at 10:43 AM   -  To page go to [email or web address]

## 2023-07-17 NOTE — Evaluation (Signed)
 Occupational Therapy Evaluation Patient Details Name: David [name] MRN: [id number] DOB: [birth year] Today's Date: 07/17/2023   History of Present Illness   63 yo presented 07/12/23 after fall landing on left hip on the side of the tub. 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     Clinical Impressions Prior to admission patient was modified independent with adls.  Currently patient is requiring min to max A with adls due to pain not just from the L hip but also due to not having a recent bowel movement.  Initially deferred therapy, but after RN gave additional pain meds, patient was able to get to the EOB and stand for some adl activity.  Given patients recent CABG and spinal surgery feel he will need additional time to achieve greater independence with adls. Recommend intensive inpatient follow-up therapy, >3 hours/day and continued OT on acute to facilitate d/c     If plan is discharge home, recommend the following:   A little help with walking and/or transfers;A lot of help with bathing/dressing/bathroom;Assistance with cooking/housework;Help with stairs or ramp for entrance;Assist for transportation     Functional Status Assessment   Patient has had a recent decline in their functional status and demonstrates the ability to make significant improvements in function in a reasonable and predictable amount of time.     Equipment Recommendations   Other (comment) (TBA)     Recommendations for Other Services   Rehab consult     Precautions/Restrictions   Precautions Precautions: Anterior Hip Restrictions Weight Bearing Restrictions Per Provider Order: Yes LLE Weight Bearing Per Provider Order: Weight bearing as tolerated     Mobility Bed Mobility Overal bed mobility: Needs Assistance Bed Mobility: Supine to Sit,  Sit to Supine     Supine to sit: Mod assist Sit to supine: Min assist   General bed mobility comments: Needed assist to control the LLE and during the transition from supine to sit.  Has the [facility] raised and is using an overhead trapezius bar    Transfers   Equipment used: Rolling walker (2 wheels) Transfers: Sit to/from Stand Sit to Stand: Min assist                  Balance Overall balance assessment: Needs assistance Sitting-balance support: No upper extremity supported, Feet supported Sitting balance-Leahy Scale: Good Sitting balance - Comments: Poor dynamic sitting due to pain                                   ADL either performed or assessed with clinical judgement   ADL Overall ADL's : Needs assistance/impaired Eating/Feeding: Independent   Grooming: Wash/dry face;Wash/dry hands;Oral care;Standing;Contact guard assist   Upper Body Bathing: Moderate assistance;Sitting   Lower Body Bathing: Maximal assistance;Sit to/from stand   Upper Body Dressing : Moderate assistance;Sitting   Lower Body Dressing: Maximal assistance;Sit to/from stand   Toilet Transfer: Minimal assistance           Functional mobility during ADLs: Minimal assistance General ADL Comments: Patient is in pain due to not having a bowel movement in 6 days and his L hip.     Vision Baseline Vision/History: 1 Wears glasses Ability to See in Adequate Light: 0 Adequate Patient Visual Report: No change from baseline  Perception         Praxis         Pertinent Vitals/Pain Pain Assessment Pain Assessment: 0-10 Pain Score: 8  Pain Location: left hip, stomach Pain Descriptors / Indicators: Operative site guarding Pain Intervention(s): Limited activity within patient's tolerance     Extremity/Trunk Assessment Upper Extremity Assessment Upper Extremity Assessment: Overall WFL for tasks assessed       Cervical / Trunk Assessment Cervical / Trunk Assessment:  Normal   Communication Communication Communication: No apparent difficulties   Cognition Arousal: Alert Behavior During Therapy: WFL for tasks assessed/performed Cognition: No apparent impairments                               Following commands: Intact       Cueing  General Comments   Cueing Techniques: Verbal cues  no family present, VSS   Exercises     Shoulder Instructions      Home Living Family/patient expects to be discharged to:: Private residence Living Arrangements: Spouse/significant other Available Help at Discharge: Family Type of Home: House Home Access: Level entry     Home Layout: One level     Bathroom Shower/Tub: Chief Strategy Officer: Standard Bathroom Accessibility: No   Home Equipment: Hand held Programmer, systems (2 wheels);Cane - quad;Wheelchair - manual;BSC/3in1          Prior Functioning/Environment Prior Level of Function : Independent/Modified Independent             Mobility Comments: utilized SPC for mobility ADLs Comments: States his wife has assisted him after preious surgeries    OT Problem List: Decreased activity tolerance;Impaired balance (sitting and/or standing);Decreased knowledge of use of DME or AE;Pain   OT Treatment/Interventions: Self-care/ADL training;Therapeutic activities;Patient/family education;Balance training;DME and/or AE instruction      OT Goals(Current goals can be found in the care plan section)   Acute Rehab OT Goals Patient Stated Goal: To do better OT Goal Formulation: With patient Time For Goal Achievement: 07/31/23 Potential to Achieve Goals: Good   OT Frequency:  Min 2X/week    Co-evaluation              AM-PAC OT "6 Clicks" Daily Activity     Outcome Measure Help from another person eating meals?: None Help from another person taking care of personal grooming?: A Little Help from another person toileting, which includes using toliet,  bedpan, or urinal?: A Little Help from another person bathing (including washing, rinsing, drying)?: A Lot Help from another person to put on and taking off regular upper body clothing?: A Little Help from another person to put on and taking off regular lower body clothing?: A Lot 6 Click Score: 17   End of Session Equipment Utilized During Treatment: Gait belt Nurse Communication: Mobility status  Activity Tolerance: Patient limited by pain Patient left: in bed;with call bell/phone within reach  OT Visit Diagnosis: Unsteadiness on feet (R26.81);History of falling (Z91.81);Pain Pain - Right/Left: Left Pain - part of body: Leg                Time: 1610-9604 OT Time Calculation (min): 43 min Charges:  OT General Charges $OT Visit: 1 Visit OT Evaluation $OT Eval Moderate Complexity: 1 Mod OT Treatments $Self Care/Home Management : 23-37 mins  Hal Neer OTR/L   Malachi Bonds 07/17/2023, 12:03 PM

## 2023-07-17 NOTE — Progress Notes (Signed)
 PHARMACY - ANTICOAGULATION CONSULT NOTE  Pharmacy Consult for Eliquis Indication: atrial fibrillation  No Known Allergies  Patient Measurements: Height: (P) 6' (182.9 cm) Weight: (P) 81.6 kg (180 lb) IBW/kg (Calculated) : (P) 77.6 HEPARIN DW (KG): (P) 81.6  Vital Signs: Temp: 99.1 F (37.3 C) (04/14 0842) Temp Source: Oral (04/14 0842) BP: 116/79 (04/14 0842) Pulse Rate: 82 (04/14 0842)  Labs: Recent Labs    07/15/23 0620 07/16/23 0656 07/17/23 0442  HGB 9.4* 8.6* 7.9*  HCT 28.2* 25.9* 23.1*  PLT 139* 115* 111*  CREATININE 2.18* 2.03* 1.69*    Estimated Creatinine Clearance: 50.4 mL/min (A) (by C-G formula based on SCr of 1.69 mg/dL (H)).   Medical History: Past Medical History:  Diagnosis Date   Cardiomyopathy (HCC)    a. EF 45% in 2019.   CHF (congestive heart failure) (HCC)    Chronic anticoagulation 09/30/2017   Diabetes mellitus type 2 in nonobese [facility])    Discitis of thoracic region 09/25/2022   Does not have health insurance    Essential hypertension 09/26/2017   Financial difficulties    H/O noncompliance with medical treatment, presenting hazards to health    Hardware complicating wound infection (HCC) 09/25/2022   Non-insulin treated type 2 diabetes mellitus (HCC) 09/26/2017   Persistent atrial fibrillation (HCC)    Sleep apnea suspected 09/30/2017   "years ago"- not anymore per patient   Assessment: 63 yr old male on Eliquis 5 mg BID prior to admit for atrial fibrillation.  Eliquis held on admit after fall and left hip fracture with need for surgery.  SQ heparin begun for VTE prophylaxis begun POD #1 and Eliquis was to resume POD #2 if hemoglobin >9.  Hemoglobin less than 9 (8.6) on POD# 2 and resuming postponed.  Hemoglobin and platelet count trended down again today, but Eliquis to resume today, cleared by Ortho. No bleeding noted.  Last SQ heparin dose ~6am today.  Aspirin 81 mg daily discontinued after today's dose.  Goal of Therapy:   Appropriate Eliquis regimen for indicaiton Monitor platelets by anticoagulation protocol: Yes   Plan:  Discontinue SQ heparin. Resume Eliquis 5 mg PO BID. Follow up CBCs; monitor for signs/symptoms of bleeding.  Adolphus Akin, RPh 07/17/2023,11:28 AM

## 2023-07-17 NOTE — Plan of Care (Signed)
  Problem: Education: Goal: Ability to describe self-care measures that may prevent or decrease complications (Diabetes Survival Skills Education) will improve Outcome: Progressing   Problem: Coping: Goal: Ability to adjust to condition or change in health will improve Outcome: Progressing   Problem: Nutritional: Goal: Maintenance of adequate nutrition will improve Outcome: Progressing

## 2023-07-17 NOTE — Progress Notes (Signed)
 Orthopaedic Trauma Progress Note  SUBJECTIVE: Doing okay today.  Notes pain in the left lower extremity which he expects given his injury and surgery.  Has been able to mobilize short distances with therapies.  Prior to admission, patient was on hydrocodone 10-325 mg and Xtampza ER prescribed by his pain management provider at [facility] for chronic back pain.  While on the Xtampza, felt that he had a great pain regimen but due to insurance, has had issues with continuing to get this medication.  We discussed that with his baseline chronic pain in addition to a new injury/surgery, pain control postoperatively can be difficult.  He is understanding of this.  Does not want to change or adjust any of the medications he is on currently.  No chest pain. No SOB. No nausea/vomiting.  Patient has not had a bowel movement since admission, notes some abdominal discomfort this morning.  Has been given both Senokot and MiraLAX which she is hopeful will start working soon.  No other complaints.  Patient interested in CIR.  OBJECTIVE:  Vitals:   07/16/23 2030 07/17/23 0356  BP:  121/73  Pulse: 84   Resp:    Temp: 98.2 F (36.8 C) 97.9 F (36.6 C)  SpO2: 96%     Opiates Today (MME): Today's  total administered Morphine Milligram Equivalents: 30 Opiates Yesterday (MME): Yesterday's total administered Morphine Milligram Equivalents: 170  General: Sitting up in bed, no acute distress. Respiratory: No increased work of breathing.  Operative Extremity (LLE): Aquacel dressing clean, dry, intact.  Some swelling noted about the hip as expected.  Tenderness with palpation of this area.  Less tender throughout the remainder of the thigh and lower leg.  No significant calf tenderness.  Ankle dorsiflexion/plantarflexion intact.  Able to wiggle the toes.  Endorses sensation of light touch over all aspects of the foot but does have some baseline neuropathy. + DP pulse.  IMAGING: Stable post op imaging.    LABS:  Results for orders placed or performed during the hospital encounter of 07/12/23 (from the past 24 hours)  Glucose, capillary     Status: Abnormal   Collection Time: 07/16/23  8:29 AM  Result Value Ref Range   Glucose-Capillary 151 (H) 70 - 99 mg/dL  Glucose, capillary     Status: Abnormal   Collection Time: 07/16/23 11:33 AM  Result Value Ref Range   Glucose-Capillary 209 (H) 70 - 99 mg/dL  Glucose, capillary     Status: Abnormal   Collection Time: 07/16/23  6:52 PM  Result Value Ref Range   Glucose-Capillary 181 (H) 70 - 99 mg/dL  Glucose, capillary     Status: Abnormal   Collection Time: 07/16/23  8:27 PM  Result Value Ref Range   Glucose-Capillary 185 (H) 70 - 99 mg/dL  Glucose, capillary     Status: Abnormal   Collection Time: 07/16/23 11:22 PM  Result Value Ref Range   Glucose-Capillary 158 (H) 70 - 99 mg/dL  Glucose, capillary     Status: Abnormal   Collection Time: 07/17/23  4:32 AM  Result Value Ref Range   Glucose-Capillary 150 (H) 70 - 99 mg/dL  CBC     Status: Abnormal   Collection Time: 07/17/23  4:42 AM  Result Value Ref Range   WBC 7.3 4.0 - 10.5 K/uL   RBC 2.60 (L) 4.22 - 5.81 MIL/uL   Hemoglobin 7.9 (L) 13.0 - 17.0 g/dL   HCT 81.1 (L) 91.4 - 78.2 %   MCV 88.8  80.0 - 100.0 fL   MCH 30.4 26.0 - 34.0 pg   MCHC 34.2 30.0 - 36.0 g/dL   RDW 78.2 95.6 - 21.3 %   Platelets 111 (L) 150 - 400 K/uL   nRBC 0.4 (H) 0.0 - 0.2 %  Basic metabolic panel with GFR     Status: Abnormal   Collection Time: 07/17/23  4:42 AM  Result Value Ref Range   Sodium 131 (L) 135 - 145 mmol/L   Potassium 3.8 3.5 - 5.1 mmol/L   Chloride 103 98 - 111 mmol/L   CO2 19 (L) 22 - 32 mmol/L   Glucose, Bld 146 (H) 70 - 99 mg/dL   BUN 41 (H) 8 - 23 mg/dL   Creatinine, Ser 0.86 (H) 0.61 - 1.24 mg/dL   Calcium 8.3 (L) 8.9 - 10.3 mg/dL   GFR, Estimated 46 (L) >60 mL/min   Anion gap 9 5 - 15  Magnesium     Status: None   Collection Time: 07/17/23  4:42 AM  Result Value Ref Range    Magnesium 2.1 1.7 - 2.4 mg/dL    ASSESSMENT: David [name] is a 63 y.o. male, 3 Days Post-Op s/p mechanical fall Procedures: TOTAL HIP ARTHOPLASTY,ANTERIOR APPROACH FOR FRACTURE  CV/Blood loss: Acute blood loss anemia, Hgb 7.9 this AM. Hemodynamically stable  PLAN: Weightbearing: WBAT LLE ROM: Unrestricted ROM.  No posterior hip precautions needed. Incisional and dressing care: Dressings left intact until follow-up  Showering: ok to allow Aquacel to get wet Orthopedic device(s): None  Pain management:  1. Tylenol 500 mg q 8 hours  2. Robaxin 750 mg q 8 hours PRN 3. Norco 10-325 mg q 6 hours  4. Dilaudid 0.5-1 mg q 4 hours PRN 5. Oxycontin 10 mg q 12 hours 6. Lyrica 75 mg BID VTE prophylaxis: Heparin currently. From surgical standpoint, ok to resume Eliquis. Continue SCDs ID:  Ancef 2gm post op complete Foley/Lines:  No foley, KVO IVFs Medical co-morbidities: A-fib, HF, HTN, DM type II Dispo: PT/OT eval ongoing, appears to be potential CIR candidate. Check Vit D level today. Okay for discharge from ortho standpoint once cleared by medicine team and therapies  D/C recommendations: - Continue Norco 10-325 mg and Oxycontin 10 mg BID for pain control - Home dose Eliquis for DVT prophylaxis - Possible need for Vit D supplementation  Follow - up plan: 2 weeks after d/c for wound check and repeat x-rays   Contact information:  Katheryne Pane MD, Alona Jamaica PA-C. After hours and holidays please check [email or web address] for group call information for [facility]   Edilia Gordon, PA-C [phone] (office) [email or web address]

## 2023-07-18 DIAGNOSIS — S7292XA Unspecified fracture of left femur, initial encounter for closed fracture: Secondary | ICD-10-CM | POA: Diagnosis not present

## 2023-07-18 LAB — CBC
HCT: 24 % — ABNORMAL LOW (ref 39.0–52.0)
Hemoglobin: 7.9 g/dL — ABNORMAL LOW (ref 13.0–17.0)
MCH: 29.7 pg (ref 26.0–34.0)
MCHC: 32.9 g/dL (ref 30.0–36.0)
MCV: 90.2 fL (ref 80.0–100.0)
Platelets: 129 10*3/uL — ABNORMAL LOW (ref 150–400)
RBC: 2.66 MIL/uL — ABNORMAL LOW (ref 4.22–5.81)
RDW: 14.6 % (ref 11.5–15.5)
WBC: 6.4 10*3/uL (ref 4.0–10.5)
nRBC: 1.1 % — ABNORMAL HIGH (ref 0.0–0.2)

## 2023-07-18 LAB — BASIC METABOLIC PANEL WITH GFR
Anion gap: 7 (ref 5–15)
BUN: 34 mg/dL — ABNORMAL HIGH (ref 8–23)
CO2: 22 mmol/L (ref 22–32)
Calcium: 8.3 mg/dL — ABNORMAL LOW (ref 8.9–10.3)
Chloride: 104 mmol/L (ref 98–111)
Creatinine, Ser: 1.59 mg/dL — ABNORMAL HIGH (ref 0.61–1.24)
GFR, Estimated: 49 mL/min — ABNORMAL LOW (ref >60)
Glucose, Bld: 164 mg/dL — ABNORMAL HIGH (ref 70–99)
Potassium: 4 mmol/L (ref 3.5–5.1)
Sodium: 133 mmol/L — ABNORMAL LOW (ref 135–145)

## 2023-07-18 LAB — GLUCOSE, CAPILLARY
Glucose-Capillary: 164 mg/dL — ABNORMAL HIGH (ref 70–99)
Glucose-Capillary: 189 mg/dL — ABNORMAL HIGH (ref 70–99)
Glucose-Capillary: 160 mg/dL — ABNORMAL HIGH (ref 70–99)
Glucose-Capillary: 202 mg/dL — ABNORMAL HIGH (ref 70–99)
Glucose-Capillary: 282 mg/dL — ABNORMAL HIGH (ref 70–99)

## 2023-07-18 LAB — MAGNESIUM: Magnesium: 2.2 mg/dL (ref 1.7–2.4)

## 2023-07-18 MED ORDER — INSULIN ASPART 100 UNIT/ML IJ SOLN
0.0000 [IU] | Freq: Every day | INTRAMUSCULAR | Status: DC
  Administered 2023-07-18: 3 [IU] via SUBCUTANEOUS

## 2023-07-18 MED ORDER — CHLORHEXIDINE GLUCONATE CLOTH 2 % EX PADS
6.0000 | MEDICATED_PAD | Freq: Every day | CUTANEOUS | Status: DC
  Administered 2023-07-19 – 2023-07-21 (×3): 6 via TOPICAL

## 2023-07-18 MED ORDER — FUROSEMIDE 40 MG PO TABS
40.0000 mg | ORAL_TABLET | Freq: Once | ORAL | Status: AC
  Administered 2023-07-18: 40 mg via ORAL
  Filled 2023-07-18: qty 1

## 2023-07-18 MED ORDER — INSULIN ASPART 100 UNIT/ML IJ SOLN
0.0000 [IU] | Freq: Three times a day (TID) | INTRAMUSCULAR | Status: DC
  Administered 2023-07-18: 5 [IU] via SUBCUTANEOUS
  Administered 2023-07-18: 3 [IU] via SUBCUTANEOUS
  Administered 2023-07-19: 5 [IU] via SUBCUTANEOUS
  Administered 2023-07-19 (×2): 3 [IU] via SUBCUTANEOUS
  Administered 2023-07-20: 2 [IU] via SUBCUTANEOUS
  Administered 2023-07-20: 3 [IU] via SUBCUTANEOUS
  Administered 2023-07-21: 2 [IU] via SUBCUTANEOUS

## 2023-07-18 NOTE — Progress Notes (Signed)
 Physical Therapy Treatment Patient Details Name: David [name] MRN: [id number] DOB: 09-Dec-1960 Today's Date: 07/18/2023   History of Present Illness 63 yo presented 07/12/23 after fall landing on left hip on the side of the tub. 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    PT Comments  Progressing well in beginning of session. Min assist for bed mobility to support LLE and pull through therapist's hand to rise to EOB using trapeze bar as well as rail. CGA to transfer with RW and squat pivot transfer towards right without AD. Able to stand at sink with single UE support and brush teeth CGA. CGA for gait but limited to 35 feet this morning due to increasing pain in Lt hip, required to sit and transport back to room. Educated on gait symmetry and RW use to maximize support as needed. Per rehab [facility] pt no longer interested in CIR - prefers SNF now. Patient will benefit from continued inpatient follow up therapy, <3 hours/day. Patient will continue to benefit from skilled physical therapy services to further improve independence with functional mobility.     If plan is discharge home, recommend the following: A little help with walking and/or transfers;A little help with bathing/dressing/bathroom;Assistance with cooking/housework;Assist for transportation;Help with stairs or ramp for entrance   Can travel by private vehicle     Yes  Equipment Recommendations  None recommended by PT    Recommendations for Other Services       Precautions / Restrictions Precautions Precautions: None (per surgeon - post op note "No hip precautions") Restrictions Weight Bearing Restrictions Per Provider Order: Yes LLE Weight Bearing Per Provider Order: Weight bearing as tolerated     Mobility  Bed Mobility Overal bed mobility: Needs Assistance Bed Mobility:  Supine to Sit     Supine to sit: Min assist, HOB elevated, Used rails     General bed mobility comments: Min assist to support LLE for comfort. Cues for technique, uses trapeze and rail. Pulls through therapist's hand to scoot to EOB.    Transfers Overall transfer level: Needs assistance Equipment used: Rolling walker (2 wheels) Transfers: Sit to/from Stand, Bed to chair/wheelchair/BSC Sit to Stand: Contact guard assist, From elevated surface     Squat pivot transfers: Contact guard assist     General transfer comment: CGA from elevated bed surface, cues for set-up and hand placement. Slow effortful rise but stabilized with RW for support. Denies dizziness. CGA for safety with squat pivot transfer towards Rt side with cues for technique.    Ambulation/Gait Ambulation/Gait assistance: Contact guard assist Gait Distance (Feet): 35 Feet Assistive device: Rolling walker (2 wheels) Gait Pattern/deviations: Decreased stride length, Antalgic, Trunk flexed, Step-to pattern, Decreased stance time - left, Decreased weight shift to left Gait velocity: dec Gait velocity interpretation: <1.31 ft/sec, indicative of household ambulator   General Gait Details: Educated on RW placement and techniques to reduce WB through LLE as needed and to maximize stability. CGA through distance covered today, very slow and antalgic, UEs fatigue. No overt LOB but requires CGA for safety. Needed to sit before returning to room - pt wheeled back to recliner.   Stairs             Wheelchair Mobility     Tilt Bed    Modified Rankin (Stroke Patients Only)  Balance Overall balance assessment: Needs assistance Sitting-balance support: No upper extremity supported, Feet supported Sitting balance-Leahy Scale: Good     Standing balance support: Single extremity supported, During functional activity Standing balance-Leahy Scale: Poor Standing balance comment: brushed teeth at sink                             Communication Communication Communication: No apparent difficulties  Cognition Arousal: Alert Behavior During Therapy: WFL for tasks assessed/performed   PT - Cognitive impairments: No apparent impairments                         Following commands: Intact      Cueing Cueing Techniques: Verbal cues  Exercises Total Joint Exercises Ankle Circles/Pumps: AROM, Both, 10 reps, Supine Quad Sets: Both, Strengthening, 10 reps, Supine    General Comments        Pertinent Vitals/Pain Pain Assessment Pain Assessment: Faces Faces Pain Scale: Hurts even more Pain Location: left hip Pain Descriptors / Indicators: Operative site guarding, Aching Pain Intervention(s): Monitored during session, Limited activity within patient's tolerance, Premedicated before session, Repositioned    Home Living                          Prior Function            PT Goals (current goals can now be found in the care plan section) Acute Rehab PT Goals Patient Stated Goal: go to AIR and then home PT Goal Formulation: With patient Time For Goal Achievement: 07/29/23 Potential to Achieve Goals: Good Progress towards PT goals: Progressing toward goals    Frequency    Min 3X/week      PT Plan      Co-evaluation              AM-PAC PT "6 Clicks" Mobility   Outcome Measure  Help needed turning from your back to your side while in a flat bed without using bedrails?: A Little Help needed moving from lying on your back to sitting on the side of a flat bed without using bedrails?: A Little Help needed moving to and from a bed to a chair (including a wheelchair)?: A Little Help needed standing up from a chair using your arms (e.g., wheelchair or bedside chair)?: A Little Help needed to walk in hospital room?: A Little Help needed climbing 3-5 steps with a railing? : A Lot 6 Click Score: 17    End of Session Equipment Utilized During Treatment:  Gait belt Activity Tolerance: Patient limited by pain Patient left: with call bell/phone within reach;in chair;with chair alarm set Nurse Communication: Mobility status PT Visit Diagnosis: Other abnormalities of gait and mobility (R26.89);Pain Pain - Right/Left: Left Pain - part of body: Hip     Time: 4098-1191 PT Time Calculation (min) (ACUTE ONLY): 39 min  Charges:    $Gait Training: 8-22 mins $Therapeutic Activity: 23-37 mins PT General Charges $$ ACUTE PT VISIT: 1 Visit                     Kathlyn Sacramento, PT, DPT [facility]  Rehabilitation Services Physical Therapist Office: [phone] Website: [email or web address]    Berton Mount 07/18/2023, 1:02 PM

## 2023-07-18 NOTE — Progress Notes (Signed)
 [facility] PROGRESS NOTE   David [name] [name] DOB: [birth year] DOA: 07/12/2023  PCP: [name], MD  Brief History: 63 y.o. male with medical history significant of chronic atrial fibrillation, heart failure with reduced ejection fraction, essential hypertension, CABG in 04/2023, CKD stage IIIb, non-insulin-dependent DM type II, and hyperlipidemia, spinal surgery for infection last year who had a mechanical fall at home resulting in pain in the left hip area.  Found to have left hip fracture.  Hospitalized for further management.   Consultants: Cardiology.  Orthopedics  Procedures:   Left total hip arthoplasty for femoral neck fracture by Dr. Jena Gauss on 07/14/2023  Subjective -  Patient in bed, appears comfortable, denies any headache, no fever, no chest pain or pressure, no shortness of breath , no abdominal pain. No new focal weakness.   Assessment/Plan:  Left hip fracture  This was secondary to mechanical fall. Orthopedics has been consulted, preop clearance provided by cardiology, status post total hip arthroplasty by Dr. Jena Gauss on 07/14/2022, tolerated the procedure well with minimal perioperative blood loss, agreeable for transfusion.  Weightbearing as tolerated, Eliquis for DVT prophylaxis started 07/17/2023.  May require SNF.  Discharge follow-up with Dr. Jena Gauss. May need CIR/SNF.  Coronary artery disease status post CABG in January 2025 For now continue Eliquis, outpatient follow-up with her cardiologist to see if he needs additional aspirin, he was not taking aspirin prior to admission Continue carvedilol as tolerated by blood pressure and heart rate and statin.   Appreciate cardiology input.  Anemia.  Likely due to combination of perioperative blood loss along with some heme dilution from IV fluids, no signs of ongoing bleeding, monitor.  Agreeable for transfusion if needed.  Permanent atrial fibrillation Went into RVR was briefly placed on amiodarone drip  now on oral amnio, blood pressure low hence Coreg dose had to be dropped, Eliquis as above.  Hypertension.  Blood pressure on the softer side.  Cut back Coreg, hold Lasix on 07/15/2023, gentle IV fluids, minimize narcotic use and monitor.  Patient counseled.    History of back surgery.  Supportive care.  Chronically on narcotics.    Chronic pain.  Takes high doses of narcotics chronically, blood pressure running soft counseled not to overuse narcotics.    Chronic systolic CHF with recovered EF Echocardiogram from January showed LVEF of 65 to 70%.  No evidence for volume overload currently.  Continue beta-blocker as tolerated by blood pressure and heart rate.  Dose Lasix daily.  AKI on chronic kidney disease stage IIIb Continued baseline around 1.7, improved after holding Lasix and gentle IV fluids, avoid hypotension.  Hyperlipidemia Continue statin.  Peripheral neuropathy Continue Lyrica.  Hyperkalemia.  Received Lokelma.    History of BPH with urinary retention post hip surgery Continue Flomax, Foley placed 07/16/2023 after multiple episodes of urinary retention in excess of 500 cc will be discharged with Foley catheter with outpatient urology follow-up.  Insulin-dependent diabetes mellitus type 2 HbA1c 7.4 in January.  On basal insulin at home.  Continue glargine and SSI.,  Sliding scale changed to every 4 while NPO.  Lab Results  Component Value Date   HGBA1C 7.4 (H) 04/09/2023    CBG (last 3)  Recent Labs    07/17/23 1949 07/17/23 2317 07/18/23 0418  GLUCAP 242* 243* 164*     DVT Prophylaxis: Apixaban on hold.  SCDs for now.  Definitive prophylaxis after surgery Code Status: Full code Family Communication: Discussed with patient Disposition Plan: To be determined  Status is:  Inpatient Remains inpatient appropriate because: Left hip fracture, atrial fibrillation  Medications: Scheduled:  acetaminophen  500 mg Oral Q8H   amiodarone  200 mg Oral BID   apixaban  5  mg Oral BID   carvedilol  3.125 mg Oral BID WC   dapagliflozin propanediol  10 mg Oral QAC breakfast   docusate sodium  200 mg Oral BID   doxycycline  100 mg Oral Q12H   HYDROcodone-acetaminophen  1 tablet Oral Q6H   insulin aspart  0-15 Units Subcutaneous TID WC   insulin aspart  0-5 Units Subcutaneous QHS   insulin glargine-yfgn  18 Units Subcutaneous Daily   naloxegol oxalate  25 mg Oral Daily   oxyCODONE  10 mg Oral Q12H   polyethylene glycol  17 g Oral BID   pregabalin  75 mg Oral BID   rosuvastatin  20 mg Oral Daily   senna-docusate  2 tablet Oral BID   tamsulosin  0.4 mg Oral QPC supper   Continuous:   WGN:FAOZHYQMVH, diltiazem, hydrALAZINE, HYDROmorphone (DILAUDID) injection, methocarbamol, [DISCONTINUED] ondansetron **OR** ondansetron (ZOFRAN) IV  Objective:  Vital Signs  Vitals:   07/17/23 1947 07/17/23 2000 07/17/23 2315 07/18/23 0414  BP: 118/73 106/69 124/79 121/69  Pulse:      Resp: 16 16 17 20   Temp: 99.4 F (37.4 C)  99.7 F (37.6 C) 98.6 F (37 C)  TempSrc:    Oral  SpO2:      Weight:      Height:        Intake/Output Summary (Last 24 hours) at 07/18/2023 0806 Last data filed at 07/18/2023 0526 Gross per 24 hour  Intake 480 ml  Output 1875 ml  Net -1395 ml   Filed Weights   07/12/23 2034 07/14/23 1034  Weight: 81.6 kg (P) 81.6 kg    Awake Alert, No new F.N deficits, Normal affect [place].AT,PERRAL Supple Neck, No JVD,   Symmetrical Chest wall movement, Good air movement bilaterally, CTAB iRRR,No Gallops, Rubs or new Murmurs,  +ve B.Sounds, Abd Soft, No tenderness,   No Cyanosis, left hip postop site appears stable, foley   Lab Results:  Data Reviewed: I have personally reviewed following labs and reports of the imaging studies  CBC: Recent Labs  Lab 07/12/23 2105 07/13/23 0544 07/14/23 0607 07/14/23 1247 07/15/23 0620 07/16/23 0656 07/17/23 0442 07/18/23 0436  WBC 9.0   < > 14.0*  --  12.6* 7.1 7.3 6.4  NEUTROABS 6.9  --   --    --   --   --   --   --   HGB 13.6   < > 12.6* 12.6* 9.4* 8.6* 7.9* 7.9*  HCT 41.4   < > 36.5* 37.0* 28.2* 25.9* 23.1* 24.0*  MCV 91.6   < > 87.3  --  91.9 90.6 88.8 90.2  PLT 215   < > 172  --  139* 115* 111* 129*   < > = values in this interval not displayed.    Basic Metabolic Panel: Recent Labs  Lab 07/13/23 0544 07/14/23 0607 07/14/23 1247 07/15/23 0620 07/16/23 0656 07/17/23 0442 07/18/23 0436  NA 133* 132* 133* 133* 130* 131* 133*  K 3.9 4.5 4.4 5.4* 4.2 3.8 4.0  CL 101 102 103 98 97* 103 104  CO2 22 19*  --  23 21* 19* 22  GLUCOSE 187* 256* 193* 239* 147* 146* 164*  BUN 33* 34* 34* 38* 40* 41* 34*  CREATININE 1.77* 1.74* 1.70* 2.18* 2.03* 1.69* 1.59*  CALCIUM  8.8* 9.3  --  8.9 8.5* 8.3* 8.3*  MG 1.8  --   --  2.2 2.1 2.1 2.2    GFR: Estimated Creatinine Clearance: 53.5 mL/min (A) (by C-G formula based on SCr of 1.59 mg/dL (H)).  Liver Function Tests: Recent Labs  Lab 07/13/23 0544  AST 20  ALT 16  ALKPHOS 70  BILITOT 0.7  PROT 6.3*  ALBUMIN 3.1*    Coagulation Profile: Recent Labs  Lab 07/13/23 0544  INR 1.2    CBG: Recent Labs  Lab 07/17/23 1204 07/17/23 1541 07/17/23 1949 07/17/23 2317 07/18/23 0418  GLUCAP 138* 126* 242* 243* 164*   Radiology Studies: No results found.    LOS: 6 days   Signature  -    Lynnwood Sauer M.D on 07/18/2023 at 8:06 AM   -  To page go to [email or web address]

## 2023-07-18 NOTE — Progress Notes (Signed)
 Inpatient Rehabilitation Admissions Coordinator   I met with patient at bedside. I know him from previous acute hospital admit in January.  He was recently at [facility] for one month and returned home 3/5. He states he does not feel he can tolerate the intensity of rehab required of CIR. He prefers to return to SNF at [facility]. I will make acute team and TOC aware. I told him to let me know if SNF can not be obtained and I will then reassess.  Jeannetta Millman, RN, MSN Rehab Admissions Coordinator [phone] 07/18/2023 10:47 AM

## 2023-07-18 NOTE — TOC Initial Note (Addendum)
 Transition of Care [facility]) - Initial/Assessment Note    Patient Details  Name: David [name] MRN: [id number] Date of Birth: Jan 03, 1961  [facility] CM/SW Contact:    Mearl Latin, LCSW Phone Number: 07/18/2023, 11:04 AM  Clinical Narrative:                 11am-CSW notified that patient is requesting to return to [facility] rather than CIR. CSW sent referral to [id number] for review.   11:53 AM-[facility] able to accept patient and will begin the insurance process. CSW updated patient.   Expected Discharge Plan: Skilled Nursing Facility Barriers to Discharge: Insurance Authorization, SNF Pending bed offer   Patient Goals and CMS Choice Patient states their goals for this hospitalization and ongoing recovery are:: Rehab CMS Medicare.gov Compare Post Acute Care list provided to:: Patient Choice offered to / list presented to : Patient [place] ownership interest in [facility].provided to:: Patient    Expected Discharge Plan and Services In-house Referral: Clinical Social Work   Post Acute Care Choice: Skilled Nursing Facility Living arrangements for the past 2 months: Single Family Home                                      Prior Living Arrangements/Services Living arrangements for the past 2 months: Single Family Home Lives with:: Spouse Patient language and need for interpreter reviewed:: Yes Do you feel safe going back to the place where you live?: Yes      Need for Family Participation in Patient Care: Yes (Comment) Care giver support system in place?: Yes (comment)   Criminal Activity/Legal Involvement Pertinent to Current Situation/Hospitalization: No - Comment as needed  Activities of Daily Living   ADL Screening (condition at time of admission) Independently performs ADLs?: Yes (appropriate for developmental age) Is the patient deaf or have difficulty hearing?: No Does the patient have difficulty seeing, even when wearing  glasses/contacts?: No Does the patient have difficulty concentrating, remembering, or making decisions?: No  Permission Sought/Granted Permission sought to share information with : Facility Medical sales representative, Family Supports Permission granted to share information with : Yes, Verbal Permission Granted     Permission granted to share info w AGENCY: SNF        Emotional Assessment Appearance:: Appears stated age Attitude/Demeanor/Rapport: Engaged Affect (typically observed): Accepting Orientation: : Oriented to Self, Oriented to Place, Oriented to  Time, Oriented to Situation Alcohol / Substance Use: Not Applicable Psych Involvement: No (comment)  Admission diagnosis:  Closed left femoral fracture (HCC) [S72.92XA] Left displaced femoral neck fracture (HCC) [S72.002A] Fall, initial encounter [W19.XXXA] Patient Active Problem List   Diagnosis Date Noted   Left displaced femoral neck fracture (HCC) 07/13/2023   Closed left femoral fracture (HCC) 07/12/2023   History of CHF (congestive heart failure) 07/12/2023   Chronic kidney disease (CKD), stage III (moderate) (HCC) 07/12/2023   Hyperlipidemia 07/12/2023   BPH (benign prostatic hyperplasia) 07/12/2023   Peripheral neuropathy 07/12/2023   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   Bradycardia 04/14/2023   NSTEMI (non-ST elevated myocardial infarction) (HCC) 04/13/2023   Chronic bilateral low back pain 04/13/2023   History of CABG in  04/2023 04/12/2023   Acute decompensated heart failure (HCC) 04/10/2023   Chronic pain syndrome 04/09/2023   MRSA infection greater than 3 months ago 04/09/2023   Hypoglycemia 10/06/2022   Pleural effusion 09/29/2022   Chronic systolic CHF (  congestive heart failure) (HCC) 09/29/2022   Acute urinary retention 09/29/2022   Anxiety 09/29/2022   Hardware complicating wound infection (HCC) 09/25/2022   Discitis of thoracic region 09/25/2022   Hypokalemia 09/07/2022   Acute toxic encephalopathy 09/07/2022    Acute postoperative anemia due to expected blood loss 09/07/2022   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   Adjustment disorder with mixed anxiety and depressed mood 08/13/2022   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 08/08/2022   MRSA bacteremia 08/08/2022   AKI (acute kidney injury), ruled out 07/27/2022   Discitis thoracic region 07/27/2022   Bacteremia due to methicillin resistant Staphylococcus aureus 07/27/2022   Severe protein-calorie malnutrition (HCC) 07/26/2022   PVD (peripheral vascular disease) (HCC) 07/26/2022   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   Bacteremia 07/15/2022   Atelectasis 07/12/2022   Sepsis due to pneumonia (HCC) 07/12/2022   Acute on chronic diastolic (congestive) heart failure (HCC) 07/12/2022   Gangrene of toe of right foot (HCC)    Sepsis (HCC) 08/29/2021   SIRS (systemic inflammatory response syndrome) (HCC) 08/29/2021   Hyponatremia 08/29/2021   Hypomagnesemia 08/29/2021   Pure hypercholesterolemia 06/17/2021   Pain of left hand 09/06/2019   Cellulitis 08/26/2019   Persistent atrial fibrillation (HCC) 08/26/2019   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) 10/18/2018   Transaminitis 10/17/2018   Hyperbilirubinemia 10/17/2018   Leukocytosis 10/17/2018   Sinusitis 10/17/2018   Chronic anticoagulation 09/30/2017   Smoker 09/30/2017   Cardiomyopathy (HCC) 09/30/2017   Sleep apnea suspected 09/30/2017   Essential hypertension 09/26/2017   Insulin dependent type 2 diabetes mellitus (HCC) 09/26/2017   Permanent atrial fibrillation (HCC) 09/26/2017   PCP:  [name], MD Pharmacy:   [facility] [address] [place] [address] Phone: [phone] Fax: [phone]  [id number] [place], [place] - [address] AT [facility] [address] [place] [address] Phone: [phone] Fax: [phone]     Social Drivers of Health  (SDOH) Social History: SDOH Screenings   Food Insecurity: Patient Declined (07/13/2023)  Recent Concern: Food Insecurity - Food Insecurity Present (05/28/2023)  Housing: Patient Declined (07/13/2023)  Recent Concern: Housing - High Risk (05/28/2023)  Transportation Needs: Patient Declined (07/13/2023)  Recent Concern: Transportation Needs - Unmet Transportation Needs (05/28/2023)  Utilities: Not At Risk (04/10/2023)  Alcohol Screen: Low Risk  (03/01/2023)  Depression (PHQ2-9): Medium Risk (06/22/2023)  Financial Resource Strain: High Risk (05/28/2023)  Physical Activity: Insufficiently Active (05/28/2023)  Social Connections: Unknown (05/28/2023)  Stress: Stress Concern Present (05/28/2023)  Tobacco Use: Medium Risk (07/14/2023)   SDOH Interventions:     Readmission Risk Interventions    07/18/2023   11:02 AM 09/06/2021   12:07 PM  Readmission Risk Prevention Plan  Post Dischage Appt  Complete  Medication Screening  Complete  Transportation Screening Complete Complete  Medication Review (RN Care Manager) Complete   PCP or Specialist appointment within 3-5 days of discharge Complete   HRI or Home Care Consult Complete   SW Recovery Care/Counseling Consult Complete   Palliative Care Screening Not Applicable   Skilled Nursing Facility Complete

## 2023-07-18 NOTE — [place] FL2 (Signed)
 [place] MEDICAID FL2 LEVEL OF CARE FORM     IDENTIFICATION  Patient Name: David [name] Birthdate: Aug 18, 1960 Sex: male Admission Date (Current Location): 07/12/2023  [facility] and [place] Number:  Producer, television/film/video and Address:  [employer]. [facility], [address], [place], [place] [address]      Provider Number: [id number]  Attending Physician Name and Address:  Leroy Sea, MD  Relative Name and Phone Number:       Current Level of Care: Hospital Recommended Level of Care: Skilled Nursing Facility Prior Approval Number:    Date Approved/Denied:   PASRR Number: [id number]  Discharge Plan: SNF    Current Diagnoses: Patient Active Problem List   Diagnosis Date Noted   Left displaced femoral neck fracture (HCC) 07/13/2023   Closed left femoral fracture (HCC) 07/12/2023   History of CHF (congestive heart failure) 07/12/2023   Chronic kidney disease (CKD), stage III (moderate) (HCC) 07/12/2023   Hyperlipidemia 07/12/2023   BPH (benign prostatic hyperplasia) 07/12/2023   Peripheral neuropathy 07/12/2023   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   Bradycardia 04/14/2023   NSTEMI (non-ST elevated myocardial infarction) (HCC) 04/13/2023   Chronic bilateral low back pain 04/13/2023   History of CABG in  04/2023 04/12/2023   Acute decompensated heart failure (HCC) 04/10/2023   Chronic pain syndrome 04/09/2023   MRSA infection greater than 3 months ago 04/09/2023   Hypoglycemia 10/06/2022   Pleural effusion 09/29/2022   Chronic systolic CHF (congestive heart failure) (HCC) 09/29/2022   Acute urinary retention 09/29/2022   Anxiety 09/29/2022   Hardware complicating wound infection (HCC) 09/25/2022   Discitis of thoracic region 09/25/2022   Hypokalemia 09/07/2022   Acute toxic encephalopathy 09/07/2022   Acute postoperative anemia due to expected blood loss 09/07/2022   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   Adjustment disorder with mixed anxiety and depressed mood  08/13/2022   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 08/08/2022   MRSA bacteremia 08/08/2022   AKI (acute kidney injury), ruled out 07/27/2022   Discitis thoracic region 07/27/2022   Bacteremia due to methicillin resistant Staphylococcus aureus 07/27/2022   Severe protein-calorie malnutrition (HCC) 07/26/2022   PVD (peripheral vascular disease) (HCC) 07/26/2022   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   Bacteremia 07/15/2022   Atelectasis 07/12/2022   Sepsis due to pneumonia (HCC) 07/12/2022   Acute on chronic diastolic (congestive) heart failure (HCC) 07/12/2022   Gangrene of toe of right foot (HCC)    Sepsis (HCC) 08/29/2021   SIRS (systemic inflammatory response syndrome) (HCC) 08/29/2021   Hyponatremia 08/29/2021   Hypomagnesemia 08/29/2021   Pure hypercholesterolemia 06/17/2021   Pain of left hand 09/06/2019   Cellulitis 08/26/2019   Persistent atrial fibrillation (HCC) 08/26/2019   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) 10/18/2018   Transaminitis 10/17/2018   Hyperbilirubinemia 10/17/2018   Leukocytosis 10/17/2018   Sinusitis 10/17/2018   Chronic anticoagulation 09/30/2017   Smoker 09/30/2017   Cardiomyopathy (HCC) 09/30/2017   Sleep apnea suspected 09/30/2017   Essential hypertension 09/26/2017   Insulin dependent type 2 diabetes mellitus (HCC) 09/26/2017   Permanent atrial fibrillation (HCC) 09/26/2017    Orientation RESPIRATION BLADDER Height & Weight     Self, Situation, Time, Place  Normal Continent, Indwelling catheter Weight: (P) 180 lb (81.6 kg) Height:  (P) 6' (182.9 cm)  BEHAVIORAL SYMPTOMS/MOOD NEUROLOGICAL BOWEL NUTRITION STATUS      Continent Diet (See dc summary)  AMBULATORY STATUS COMMUNICATION OF NEEDS Skin   Limited Assist Verbally Surgical wounds (closed  incision on hip)                       Personal Care Assistance Level of Assistance  Bathing, Feeding, Dressing Bathing Assistance: Limited assistance Feeding  assistance: Independent Dressing Assistance: Limited assistance     Functional Limitations Info             SPECIAL CARE FACTORS FREQUENCY  PT (By licensed PT), OT (By licensed OT)     PT Frequency: eval and treat OT Frequency: eval and treat            Contractures Contractures Info: Not present    Additional Factors Info  Code Status, Allergies Code Status Info: Full Allergies Info: NKA           Current Medications (07/18/2023):  This is the current hospital active medication list Current Facility-Administered Medications  Medication Dose Route Frequency Provider Last Rate Last Admin   acetaminophen (TYLENOL) tablet 500 mg  500 mg Oral Q8H Singh, Prashant K, MD   500 mg at 07/18/23 0517   ALPRAZolam (XANAX) tablet 0.5 mg  0.5 mg Oral QHS PRN Versie Gores, PA-C   0.5 mg at 07/17/23 2211   amiodarone (PACERONE) tablet 200 mg  200 mg Oral BID Singh, Prashant K, MD   200 mg at 07/18/23 0852   apixaban (ELIQUIS) tablet 5 mg  5 mg Oral BID Singh, Prashant K, MD   5 mg at 07/18/23 0852   carvedilol (COREG) tablet 3.125 mg  3.125 mg Oral BID WC Singh, Prashant K, MD   3.125 mg at 07/18/23 0711   dapagliflozin propanediol (FARXIGA) tablet 10 mg  10 mg Oral QAC breakfast Alona Jamaica A, PA-C   10 mg at 07/18/23 0711   diltiazem (CARDIZEM) injection 10 mg  10 mg Intravenous Q6H PRN Singh, Prashant K, MD       docusate sodium (COLACE) capsule 200 mg  200 mg Oral BID Singh, Prashant K, MD   200 mg at 07/17/23 0848   doxycycline (VIBRA-TABS) tablet 100 mg  100 mg Oral Q12H Singh, Prashant K, MD   100 mg at 07/18/23 9147   hydrALAZINE (APRESOLINE) injection 10 mg  10 mg Intravenous Q6H PRN Versie Gores, PA-C       HYDROcodone-acetaminophen (NORCO) 10-325 MG per tablet 1 tablet  1 tablet Oral Q6H Singh, Prashant K, MD   1 tablet at 07/18/23 8295   HYDROmorphone (DILAUDID) injection 0.5-1 mg  0.5-1 mg Intravenous Q4H PRN McBane, Caroline N, PA-C   1 mg at 07/18/23 1052    insulin aspart (novoLOG) injection 0-15 Units  0-15 Units Subcutaneous TID WC Singh, Prashant K, MD       insulin aspart (novoLOG) injection 0-5 Units  0-5 Units Subcutaneous QHS Singh, Prashant K, MD       insulin glargine-yfgn (SEMGLEE) injection 18 Units  18 Units Subcutaneous Daily Singh, Prashant K, MD   18 Units at 07/18/23 0853   methocarbamol (ROBAXIN) tablet 750 mg  750 mg Oral Q8H PRN Alona Jamaica A, PA-C   750 mg at 07/17/23 1331   naloxegol oxalate (MOVANTIK) tablet 25 mg  25 mg Oral Daily Singh, Prashant K, MD   25 mg at 07/18/23 0852   ondansetron (ZOFRAN) injection 4 mg  4 mg Intravenous Q6H PRN Versie Gores, PA-C   4 mg at 07/15/23 2024   oxyCODONE (OXYCONTIN) 12 hr tablet 10 mg  10 mg Oral Q12H Singh, Prashant K, MD  10 mg at 07/18/23 0852   polyethylene glycol (MIRALAX / GLYCOLAX) packet 17 g  17 g Oral BID Versie Gores, PA-C   17 g at 07/17/23 0847   pregabalin (LYRICA) capsule 75 mg  75 mg Oral BID Versie Gores, PA-C   75 mg at 07/18/23 6962   rosuvastatin (CRESTOR) tablet 20 mg  20 mg Oral Daily Versie Gores, PA-C   20 mg at 07/18/23 9528   senna-docusate (Senokot-S) tablet 2 tablet  2 tablet Oral BID Arn Lane, PA-C   2 tablet at 07/17/23 2211   tamsulosin (FLOMAX) capsule 0.4 mg  0.4 mg Oral QPC supper Versie Gores, PA-C   0.4 mg at 07/17/23 1643     Discharge Medications: Please see discharge summary for a list of discharge medications.  Relevant Imaging Results:  Relevant Lab Results:   Additional Information [id number]  Naylene Foell S Ceana Fiala, LCSW

## 2023-07-19 DIAGNOSIS — S7292XA Unspecified fracture of left femur, initial encounter for closed fracture: Secondary | ICD-10-CM | POA: Diagnosis not present

## 2023-07-19 DIAGNOSIS — I10 Essential (primary) hypertension: Secondary | ICD-10-CM | POA: Diagnosis not present

## 2023-07-19 LAB — BASIC METABOLIC PANEL WITH GFR
Anion gap: 7 (ref 5–15)
BUN: 32 mg/dL — ABNORMAL HIGH (ref 8–23)
CO2: 24 mmol/L (ref 22–32)
Calcium: 8.8 mg/dL — ABNORMAL LOW (ref 8.9–10.3)
Chloride: 104 mmol/L (ref 98–111)
Creatinine, Ser: 1.68 mg/dL — ABNORMAL HIGH (ref 0.61–1.24)
GFR, Estimated: 46 mL/min — ABNORMAL LOW (ref >60)
Glucose, Bld: 157 mg/dL — ABNORMAL HIGH (ref 70–99)
Potassium: 4.8 mmol/L (ref 3.5–5.1)
Sodium: 135 mmol/L (ref 135–145)

## 2023-07-19 LAB — CBC
HCT: 26.2 % — ABNORMAL LOW (ref 39.0–52.0)
Hemoglobin: 8.6 g/dL — ABNORMAL LOW (ref 13.0–17.0)
MCH: 30 pg (ref 26.0–34.0)
MCHC: 32.8 g/dL (ref 30.0–36.0)
MCV: 91.3 fL (ref 80.0–100.0)
Platelets: 166 10*3/uL (ref 150–400)
RBC: 2.87 MIL/uL — ABNORMAL LOW (ref 4.22–5.81)
RDW: 14.6 % (ref 11.5–15.5)
WBC: 7.7 10*3/uL (ref 4.0–10.5)
nRBC: 0.6 % — ABNORMAL HIGH (ref 0.0–0.2)

## 2023-07-19 LAB — GLUCOSE, CAPILLARY
Glucose-Capillary: 165 mg/dL — ABNORMAL HIGH (ref 70–99)
Glucose-Capillary: 159 mg/dL — ABNORMAL HIGH (ref 70–99)
Glucose-Capillary: 215 mg/dL — ABNORMAL HIGH (ref 70–99)
Glucose-Capillary: 169 mg/dL — ABNORMAL HIGH (ref 70–99)

## 2023-07-19 LAB — MAGNESIUM: Magnesium: 2 mg/dL (ref 1.7–2.4)

## 2023-07-19 MED ORDER — HYDROCODONE-ACETAMINOPHEN 10-325 MG PO TABS: 1.0000 | ORAL_TABLET | Freq: Four times a day (QID) | ORAL | 0 refills | Status: DC | PRN

## 2023-07-19 MED ORDER — OXYCONTIN 10 MG PO T12A: 10.0000 mg | EXTENDED_RELEASE_TABLET | Freq: Two times a day (BID) | ORAL | 0 refills | Status: DC

## 2023-07-19 MED ORDER — OXYCODONE HCL 5 MG PO TABS
5.0000 mg | ORAL_TABLET | ORAL | Status: DC | PRN
  Administered 2023-07-19 (×2): 5 mg via ORAL
  Filled 2023-07-19 (×2): qty 1

## 2023-07-19 MED ORDER — OXYCODONE HCL 5 MG PO TABS
7.5000 mg | ORAL_TABLET | ORAL | Status: DC | PRN
  Administered 2023-07-19 – 2023-07-21 (×8): 7.5 mg via ORAL
  Filled 2023-07-19 (×9): qty 2

## 2023-07-19 MED ORDER — VITAMIN D (ERGOCALCIFEROL) 1.25 MG (50000 UNIT) PO CAPS: 50000.0000 [IU] | ORAL_CAPSULE | ORAL | 0 refills | Status: AC

## 2023-07-19 MED ORDER — PANTOPRAZOLE SODIUM 40 MG PO TBEC
40.0000 mg | DELAYED_RELEASE_TABLET | Freq: Every day | ORAL | Status: DC
  Administered 2023-07-19 – 2023-07-21 (×3): 40 mg via ORAL
  Filled 2023-07-19 (×3): qty 1

## 2023-07-19 MED ORDER — VITAMIN D (ERGOCALCIFEROL) 1.25 MG (50000 UNIT) PO CAPS
50000.0000 [IU] | ORAL_CAPSULE | ORAL | Status: DC
  Administered 2023-07-19: 50000 [IU] via ORAL
  Filled 2023-07-19: qty 1

## 2023-07-19 NOTE — Progress Notes (Addendum)
   07/19/23 1705  Mobility  Activity Transferred to/from [facility]  Level of Assistance Minimal assist, patient does 75% or more  Assistive Device Front wheel walker  LLE Weight Bearing Per Provider Order WBAT  Activity Response Tolerated fair  Mobility Referral Yes  Mobility visit 1 Mobility  Mobility Specialist Start Time (ACUTE ONLY) 1644  Mobility Specialist Stop Time (ACUTE ONLY) 1705  Mobility Specialist Time Calculation (min) (ACUTE ONLY) 21 min   Mobility Specialist: Progress Note  Post-Mobility:    HR 80, SpO2 100% RA  Pt agreeable to mobility session - received in chair. C/o BM urgency and LLE pain 9/10. Pt with brown stool, pericare completed with assistance. Returned to bed with all needs met - call bell within reach.   David [name], BS Mobility Specialist Please contact via SecureChat or  Rehab office at [phone].

## 2023-07-19 NOTE — Progress Notes (Signed)
 [facility] PROGRESS NOTE   BURNHAM TROST WUJ:[id number] DOB: 1961-03-01 DOA: 07/12/2023  PCP: [name], MD  Brief History:  63 y.o. male with medical history significant of chronic atrial fibrillation, heart failure with reduced ejection fraction, essential hypertension, CABG in 04/2023, CKD stage IIIb, non-insulin-dependent DM type II, and hyperlipidemia, spinal surgery for infection last year who had a mechanical fall at home resulting in pain in the left hip area.  Found to have left hip fracture.  Hospitalized for further management.   Consultants: Cardiology.  Orthopedics  Procedures:   Left total hip arthoplasty for femoral neck fracture by Dr. Jena Gauss on 07/14/2023  Subjective   -patient in bed, reports he moved his BMs yesterday, reports left hip pain.    Assessment/Plan:  Left hip fracture   This was secondary to mechanical fall. Orthopedics has been consulted, preop clearance provided by cardiology, status post total hip arthroplasty by Dr. Jena Gauss on 07/14/2022, tolerated the procedure well with minimal perioperative blood loss, agreeable for transfusion.  Weightbearing as tolerated, Eliquis for DVT prophylaxis started 07/17/2023.   - .Plan for SNF placement m - Transition IV Dilaudid to p.o. oxycodone as anticipation for discharge to SNF once available .  Coronary artery disease status post CABG in January 2025 -Patient reports he stopped taking aspirin few weeks ago, final decision will be up to his cardiologist as an outpatient to see if it is appropriate to resume as an outpatient, but for now I will keep holding especially with his significant anemia. - He is on Eliquis. -Continue carvedilol as tolerated by blood pressure and heart rate and statin.   Appreciate cardiology input.  Acute blood loss anemia - Expected, postoperative. - Transfuse as needed.  - Target hemoglobin> 8 in the setting of known CAD and recent CABG. - Continue with Eliquis, monitor  closely.  Permanent atrial fibrillation Went into RVR was briefly placed on amiodarone drip now on oral amnio, blood pressure low hence Coreg dose had to be dropped, Eliquis as above.  Hypertension.  Blood pressure on the softer side.  Cut back Coreg, hold Lasix on 07/15/2023, gentle IV fluids, minimize narcotic use and monitor.  Patient counseled.    History of back surgery.  Supportive care.  Chronically on narcotics.    Chronic pain.  - Takes high doses of narcotics chronically, blood pressure running soft counseled not to overuse narcotics.  He is 4 days postoperatively, we will switch his IV pain medicine to p.o. in anticipation for discharge  Chronic systolic CHF with recovered EF Echocardiogram from January showed LVEF of 65 to 70%.  No evidence for volume overload currently.  Continue beta-blocker as tolerated by blood pressure and heart rate.  Dose Lasix daily.  AKI on chronic kidney disease stage IIIb Continued baseline around 1.7, improved after holding Lasix and gentle IV fluids, avoid hypotension.  Hyperlipidemia Continue statin.  Peripheral neuropathy Continue Lyrica.  Hyperkalemia.  Received Lokelma.    History of BPH with urinary retention post hip surgery Continue Flomax, Foley placed 07/16/2023 after multiple episodes of urinary retention in excess of 500 cc will be discharged with Foley catheter with outpatient urology follow-up.  Insulin-dependent diabetes mellitus type 2 HbA1c 7.4 in January.  On basal insulin at home.  Continue glargine and SSI.,  Sliding scale changed to every 4 while NPO.  Lab Results  Component Value Date   HGBA1C 7.4 (H) 04/09/2023    CBG (last 3)  Recent Labs    07/18/23 1619 07/18/23  2008 07/19/23 0807  GLUCAP 202* 282* 165*     DVT Prophylaxis: Apixaban Code Status: Full code Family Communication: Discussed with patient Disposition Plan: To be determined  Status is: Inpatient Remains inpatient appropriate because: Left  hip fracture, atrial fibrillation  Medications: Scheduled:  acetaminophen  500 mg Oral Q8H   amiodarone  200 mg Oral BID   apixaban  5 mg Oral BID   carvedilol  3.125 mg Oral BID WC   Chlorhexidine Gluconate Cloth  6 each Topical Daily   dapagliflozin propanediol  10 mg Oral QAC breakfast   docusate sodium  200 mg Oral BID   doxycycline  100 mg Oral Q12H   HYDROcodone-acetaminophen  1 tablet Oral Q6H   insulin aspart  0-15 Units Subcutaneous TID WC   insulin aspart  0-5 Units Subcutaneous QHS   insulin glargine-yfgn  18 Units Subcutaneous Daily   oxyCODONE  10 mg Oral Q12H   pantoprazole  40 mg Oral Daily   polyethylene glycol  17 g Oral BID   pregabalin  75 mg Oral BID   rosuvastatin  20 mg Oral Daily   senna-docusate  2 tablet Oral BID   tamsulosin  0.4 mg Oral QPC supper   Vitamin D (Ergocalciferol)  50,000 Units Oral Q7 days   Continuous:   XBJ:[id number], diltiazem, hydrALAZINE, methocarbamol, [DISCONTINUED] ondansetron **OR** ondansetron (ZOFRAN) IV, oxyCODONE  Objective:  Vital Signs  Vitals:   07/18/23 1200 07/18/23 2001 07/19/23 0855 07/19/23 1200  BP:  (!) 145/81 128/83   Pulse:  70 76 67  Resp:   20 19  Temp: 98.5 F (36.9 C) (!) 97.5 F (36.4 C) 98.4 F (36.9 C)   TempSrc: Oral Oral Oral   SpO2:  98% 98% 98%  Weight:      Height:        Intake/Output Summary (Last 24 hours) at 07/19/2023 1213 Last data filed at 07/18/2023 1951 Gross per 24 hour  Intake --  Output 3250 ml  Net -3250 ml   Filed Weights   07/12/23 2034 07/14/23 1034  Weight: 81.6 kg (P) 81.6 kg    Awake Alert, Oriented X 3, No new F.N deficits, Normal affect Symmetrical Chest wall movement, Good air movement bilaterally, CTAB irregular,No Gallops,Rubs or new Murmurs, No Parasternal Heave +ve B.Sounds, Abd Soft, No tenderness, No rebound - guarding or rigidity. No Cyanosis, , left hip postop site appears stable, foley   Lab Results:  Data Reviewed: I have personally reviewed  following labs and reports of the imaging studies  CBC: Recent Labs  Lab 07/12/23 2105 07/13/23 0544 07/15/23 0620 07/16/23 0656 07/17/23 0442 07/18/23 0436 07/19/23 0418  WBC 9.0   < > 12.6* 7.1 7.3 6.4 7.7  NEUTROABS 6.9  --   --   --   --   --   --   HGB 13.6   < > 9.4* 8.6* 7.9* 7.9* 8.6*  HCT 41.4   < > 28.2* 25.9* 23.1* 24.0* 26.2*  MCV 91.6   < > 91.9 90.6 88.8 90.2 91.3  PLT 215   < > 139* 115* 111* 129* 166   < > = values in this interval not displayed.    Basic Metabolic Panel: Recent Labs  Lab 07/15/23 0620 07/16/23 0656 07/17/23 0442 07/18/23 0436 07/19/23 0418  NA 133* 130* 131* 133* 135  K 5.4* 4.2 3.8 4.0 4.8  CL 98 97* 103 104 104  CO2 23 21* 19* 22 24  GLUCOSE 239* 147* 146* 164*  157*  BUN 38* 40* 41* 34* 32*  CREATININE 2.18* 2.03* 1.69* 1.59* 1.68*  CALCIUM 8.9 8.5* 8.3* 8.3* 8.8*  MG 2.2 2.1 2.1 2.2 2.0    GFR: Estimated Creatinine Clearance: 50.7 mL/min (A) (by C-G formula based on SCr of 1.68 mg/dL (H)).  Liver Function Tests: Recent Labs  Lab 07/13/23 0544  AST 20  ALT 16  ALKPHOS 70  BILITOT 0.7  PROT 6.3*  ALBUMIN 3.1*    Coagulation Profile: Recent Labs  Lab 07/13/23 0544  INR 1.2    CBG: Recent Labs  Lab 07/18/23 0810 07/18/23 1224 07/18/23 1619 07/18/23 2008 07/19/23 0807  GLUCAP 189* 160* 202* 282* 165*   Radiology Studies: No results found.    LOS: 7 days   Signature  -    Seena Dadds M.D on 07/19/2023 at 12:13 PM   -  To page go to [email or web address]

## 2023-07-19 NOTE — Progress Notes (Addendum)
 Physical Therapy Treatment Patient Details Name: David [name] MRN: [id number] DOB: [birth year] Today's Date: 07/19/2023   History of Present Illness 63 yo presented 07/12/23 after fall landing on left hip on the side of the tub. 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    PT Comments  Pt greeted supine in bed, pleasant and agreeable to PT session. He increased his gait distance, ambulating ~21ft using RW twice with a seated rest break in between. He required CGA-minA during functional mobility for stability and safety. Pt's primary limiting factor is pain. He benefited from moderate encouragement and distraction throughout session. Will continue to follow acutely and advance appropriately.    If plan is discharge home, recommend the following: A little help with walking and/or transfers;A little help with bathing/dressing/bathroom;Assistance with cooking/housework;Assist for transportation;Help with stairs or ramp for entrance   Can travel by private vehicle     Yes  Equipment Recommendations  None recommended by PT    Recommendations for Other Services       Precautions / Restrictions Precautions Precautions: Fall Recall of Precautions/Restrictions: Intact Precaution/Restrictions Comments: Per Ship broker op note "No hip precautions" Restrictions Weight Bearing Restrictions Per Provider Order: Yes LLE Weight Bearing Per Provider Order: Weight bearing as tolerated     Mobility  Bed Mobility Overal bed mobility: Needs Assistance Bed Mobility: Supine to Sit     Supine to sit: Min assist, HOB elevated, Used rails     General bed mobility comments: Pt sat up on L side of bed with increased time. He required minA to manage LLE. Pt utilized trapeze to bring trunk upright and begining pivoting. Pt pulled on PT's hand to reach  foot flat.    Transfers Overall transfer level: Needs assistance Equipment used: Rolling walker (2 wheels) Transfers: Sit to/from Stand Sit to Stand: From elevated surface, Contact guard assist           General transfer comment: Pt stood from raise bed height. He demonstrated proper hand placement using RW. Powered up to standing with CGA. Good eccentric control with sitting. Pt sat to standard chair, kicking out LLE to limit the extent of flex d/t pain. He stood from standard chair by rocking fwd to power up with CGA.    Ambulation/Gait Ambulation/Gait assistance: Contact guard assist, +2 safety/equipment (chair follow) Gait Distance (Feet): 45 Feet (x2, seated rest in betwen bouts) Assistive device: Rolling walker (2 wheels) Gait Pattern/deviations: Step-through pattern, Decreased stance time - left, Decreased weight shift to left, Antalgic, Trunk flexed Gait velocity: decreased Gait velocity interpretation: <1.31 ft/sec, indicative of household ambulator   General Gait Details: Pt ambulated slowly with shorter steps. He was able to clear the opposite foot when stepping. Pt self-limited WBing on LLE. He appeared to maintain LLE in extension and made flat foot contact with the floor and had adequate foot clearence. Pt required seated rest break d/t increased pain and UE fatigue. Pt navigated obstacles in room/hallway well.   Stairs             Wheelchair Mobility     Tilt Bed    Modified Rankin (Stroke Patients Only)       Balance Overall balance assessment: Needs assistance Sitting-balance support: No upper extremity supported, Feet supported Sitting balance-Leahy Scale: Good     Standing balance support: Bilateral upper extremity  supported, During functional activity, Reliant on assistive device for balance Standing balance-Leahy Scale: Poor Standing balance comment: Pt dependent on RW during gait, increased BUE WBing to alleviate LLE WBing and CGA for  safety.                            Communication Communication Communication: No apparent difficulties  Cognition Arousal: Alert Behavior During Therapy: WFL for tasks assessed/performed   PT - Cognitive impairments: No apparent impairments                         Following commands: Intact      Cueing Cueing Techniques: Verbal cues  Exercises      General Comments General comments (skin integrity, edema, etc.): VSS on RA      Pertinent Vitals/Pain Pain Assessment Pain Assessment: Faces Faces Pain Scale: Hurts even more Pain Location: left hip Pain Descriptors / Indicators: Operative site guarding, Aching, Discomfort Pain Intervention(s): Monitored during session, Repositioned, Ice applied    Home Living                          Prior Function            PT Goals (current goals can now be found in the care plan section) Acute Rehab PT Goals Patient Stated Goal: Get stronger Progress towards PT goals: Progressing toward goals    Frequency    Min 3X/week      PT Plan      Co-evaluation              AM-PAC PT "6 Clicks" Mobility   Outcome Measure  Help needed turning from your back to your side while in a flat bed without using bedrails?: A Little Help needed moving from lying on your back to sitting on the side of a flat bed without using bedrails?: A Little Help needed moving to and from a bed to a chair (including a wheelchair)?: A Little Help needed standing up from a chair using your arms (e.g., wheelchair or bedside chair)?: A Little Help needed to walk in hospital room?: A Little Help needed climbing 3-5 steps with a railing? : A Lot 6 Click Score: 17    End of Session Equipment Utilized During Treatment: Gait belt Activity Tolerance: Patient tolerated treatment well;No increased pain Patient left: in chair;with call bell/phone within reach;with chair alarm set Nurse Communication: Mobility status PT Visit  Diagnosis: Other abnormalities of gait and mobility (R26.89);Pain Pain - Right/Left: Left Pain - part of body: Hip     Time: 1610-9604 PT Time Calculation (min) (ACUTE ONLY): 28 min  Charges:    $Gait Training: 23-37 mins PT General Charges $$ ACUTE PT VISIT: 1 Visit                     Glenford Lanes, PT, DPT Acute Rehabilitation Services Office: [phone] Secure Chat Preferred  Riva Chester 07/19/2023, 4:57 PM

## 2023-07-19 NOTE — Discharge Instructions (Signed)
 Orthopaedic Trauma Service Discharge Instructions   General Discharge Instructions  WEIGHT BEARING STATUS:weightbearing as tolerated  RANGE OF MOTION/ACTIVITY: Ok for hip motion as tolerated. No posterior hip precautions needed  Wound Care:  Leave dressing in place until follow-up. You are ok to shower and get Aquacel bandage wet. If water gets caught under your bandage or the bandage gets soiled, please remove and wash incision daily with soap and water. In this case, change bandage daily   DVT/PE prophylaxis:  Home dose Eliquis  Diet: as you were eating previously.  Can use over the counter stool softeners and bowel preparations, such as Miralax, to help with bowel movements.  Narcotics can be constipating.  Be sure to drink plenty of fluids  PAIN MEDICATION USE AND EXPECTATIONS  You have likely been given narcotic medications to help control your pain.  After a traumatic event that results in an fracture (broken bone) with or without surgery, it is ok to use narcotic pain medications to help control one's pain.  We understand that everyone responds to pain differently and each individual patient will be evaluated on a regular basis for the continued need for narcotic medications. Ideally, narcotic medication use should last no more than 6-8 weeks (coinciding with fracture healing).   As a patient it is your responsibility as well to monitor narcotic medication use and report the amount and frequency you use these medications when you come to your office visit.   We would also advise that if you are using narcotic medications, you should take a dose prior to therapy to maximize you participation.  IF YOU ARE ON NARCOTIC MEDICATIONS IT IS NOT PERMISSIBLE TO OPERATE A MOTOR VEHICLE (MOTORCYCLE/CAR/TRUCK/MOPED) OR HEAVY MACHINERY DO NOT MIX NARCOTICS WITH OTHER CNS (CENTRAL NERVOUS SYSTEM) DEPRESSANTS SUCH AS ALCOHOL  POST-OPERATIVE OPIOID TAPER INSTRUCTIONS: It is important to wean off of  your opioid medication as soon as possible. If you do not need pain medication after your surgery it is ok to stop day one. Opioids include: Codeine, Hydrocodone(Norco, Vicodin), Oxycodone(Percocet, oxycontin) and hydromorphone amongst others.  Long term and even short term use of opiods can cause: Increased pain response Dependence Constipation Depression Respiratory depression And more.  Withdrawal symptoms can include Flu like symptoms Nausea, vomiting And more Techniques to manage these symptoms Hydrate well Eat regular healthy meals Stay active Use relaxation techniques(deep breathing, meditating, yoga) Do Not substitute Alcohol to help with tapering If you have been on opioids for less than two weeks and do not have pain than it is ok to stop all together.  Plan to wean off of opioids This plan should start within one week post op of your fracture surgery  Maintain the same interval or time between taking each dose and first decrease the dose.  Cut the total daily intake of opioids by one tablet each day Next start to increase the time between doses. The last dose that should be eliminated is the evening dose.    STOP SMOKING OR USING NICOTINE PRODUCTS!!!!  As discussed nicotine severely impairs your body's ability to heal surgical and traumatic wounds but also impairs bone healing.  Wounds and bone heal by forming microscopic blood vessels (angiogenesis) and nicotine is a vasoconstrictor (essentially, shrinks blood vessels).  Therefore, if vasoconstriction occurs to these microscopic blood vessels they essentially disappear and are unable to deliver necessary nutrients to the healing tissue.  This is one modifiable factor that you can do to dramatically increase your chances of healing  your injury.  (This means no smoking, no nicotine gum, patches, etc)  DO NOT USE NONSTEROIDAL ANTI-INFLAMMATORY DRUGS (NSAID'S)  Using products such as Advil (ibuprofen), Aleve (naproxen),  Motrin (ibuprofen) for additional pain control during fracture healing can delay and/or prevent the healing response.  If you would like to take over the counter (OTC) medication, Tylenol (acetaminophen) is ok.  However, some narcotic medications that are given for pain control contain acetaminophen as well. Therefore, you should not exceed more than 4000 mg of tylenol in a day if you do not have liver disease.  Also note that there are may OTC medicines, such as cold medicines and allergy medicines that my contain tylenol as well.  If you have any questions about medications and/or interactions please ask your doctor/PA or your pharmacist.      ICE AND ELEVATE INJURED/OPERATIVE EXTREMITY  Using ice and elevating the injured extremity above your heart can help with swelling and pain control.  Icing in a pulsatile fashion, such as 20 minutes on and 20 minutes off, can be followed.    Do not place ice directly on skin. Make sure there is a barrier between to skin and the ice pack.    Using frozen items such as frozen peas works well as the conform nicely to the are that needs to be iced.  USE AN ACE WRAP OR TED HOSE FOR SWELLING CONTROL  In addition to icing and elevation, Ace wraps or TED hose are used to help limit and resolve swelling.  It is recommended to use Ace wraps or TED hose until you are informed to stop.    When using Ace Wraps start the wrapping distally (farthest away from the body) and wrap proximally (closer to the body)   Example: If you had surgery on your leg or thing and you do not have a splint on, start the ace wrap at the toes and work your way up to the thigh        If you had surgery on your upper extremity and do not have a splint on, start the ace wrap at your fingers and work your way up to the upper arm   CALL THE OFFICE FOR MEDICATION REFILLS OR WITH ANY QUESTIONS/CONCERNS: [phone]   VISIT OUR WEBSITE FOR ADDITIONAL INFORMATION: [email or web address]  Discharge Wound  Care Instructions  Do NOT apply any ointments, solutions or lotions to pin sites or surgical wounds.  These prevent needed drainage and even though solutions like hydrogen peroxide kill bacteria, they also damage cells lining the pin sites that help fight infection.  Applying lotions or ointments can keep the wounds moist and can cause them to breakdown and open up as well. This can increase the risk for infection. When in doubt call the office.  Surgical incisions should be dressed daily.   Call office for the following: Temperature greater than 101F Persistent nausea and vomiting Severe uncontrolled pain Redness, tenderness, or signs of infection (pain, swelling, redness, odor or green/yellow discharge around the site) Difficulty breathing, headache or visual disturbances Hives Persistent dizziness or light-headedness Extreme fatigue Any other questions or concerns you may have after discharge  In an emergency, call 911 or go to an Emergency Department at a nearby hospital  OTHER HELPFUL INFORMATION  If you had a block, it will wear off between 8-24 hrs postop typically.  This is period when your pain may go from nearly zero to the pain you would have had postop without the block.  This is an abrupt transition but nothing dangerous is happening.  You may take an extra dose of narcotic when this happens.  You should wean off your narcotic medicines as soon as you are able.  Most patients will be off or using minimal narcotics before their first postop appointment.   We suggest you use the pain medication the first night prior to going to bed, in order to ease any pain when the anesthesia wears off. You should avoid taking pain medications on an empty stomach as it will make you nauseous.  Do not drink alcoholic beverages or take illicit drugs when taking pain medications.  In most states it is against the law to drive while you are in a splint or sling.  And certainly against the law  to drive while taking narcotics.  You may return to work/school in the next couple of days when you feel up to it.   Pain medication may make you constipated.  Below are a few solutions to try in this order: Decrease the amount of pain medication if you aren't having pain. Drink lots of decaffeinated fluids. Drink prune juice and/or each dried prunes  If the first 3 don't work start with additional solutions Take Colace - an over-the-counter stool softener Take Senokot - an over-the-counter laxative Take Miralax - a stronger over-the-counter laxative

## 2023-07-19 NOTE — Progress Notes (Signed)
 OT Cancellation Note  Patient Details Name: David [name] MRN: [id number] DOB: 01-Jan-1961   Cancelled Treatment:    Reason Eval/Treat Not Completed: Pain limiting ability to participate (Given Ice and requested medications with nursing.) Erving Heather OTR/L  Acute Rehab Services  [phone] office number   Stevphen Elders 07/19/2023, 11:49 AM

## 2023-07-19 NOTE — Plan of Care (Signed)
   Problem: Education: Goal: Ability to describe self-care measures that may prevent or decrease complications (Diabetes Survival Skills Education) will improve Outcome: Progressing   Problem: Coping: Goal: Ability to adjust to condition or change in health will improve Outcome: Progressing   Problem: Fluid Volume: Goal: Ability to maintain a balanced intake and output will improve Outcome: Progressing

## 2023-07-20 DIAGNOSIS — S7292XA Unspecified fracture of left femur, initial encounter for closed fracture: Secondary | ICD-10-CM | POA: Diagnosis not present

## 2023-07-20 LAB — PREPARE RBC (CROSSMATCH)

## 2023-07-20 LAB — CBC
HCT: 24.7 % — ABNORMAL LOW (ref 39.0–52.0)
Hemoglobin: 8 g/dL — ABNORMAL LOW (ref 13.0–17.0)
MCH: 29.4 pg (ref 26.0–34.0)
MCHC: 32.4 g/dL (ref 30.0–36.0)
MCV: 90.8 fL (ref 80.0–100.0)
Platelets: 166 10*3/uL (ref 150–400)
RBC: 2.72 MIL/uL — ABNORMAL LOW (ref 4.22–5.81)
RDW: 14.7 % (ref 11.5–15.5)
WBC: 7 10*3/uL (ref 4.0–10.5)
nRBC: 0.6 % — ABNORMAL HIGH (ref 0.0–0.2)

## 2023-07-20 LAB — GLUCOSE, CAPILLARY
Glucose-Capillary: 163 mg/dL — ABNORMAL HIGH (ref 70–99)
Glucose-Capillary: 144 mg/dL — ABNORMAL HIGH (ref 70–99)
Glucose-Capillary: 171 mg/dL — ABNORMAL HIGH (ref 70–99)
Glucose-Capillary: 144 mg/dL — ABNORMAL HIGH (ref 70–99)

## 2023-07-20 LAB — BASIC METABOLIC PANEL WITH GFR
Anion gap: 12 (ref 5–15)
BUN: 28 mg/dL — ABNORMAL HIGH (ref 8–23)
CO2: 21 mmol/L — ABNORMAL LOW (ref 22–32)
Calcium: 8.6 mg/dL — ABNORMAL LOW (ref 8.9–10.3)
Chloride: 101 mmol/L (ref 98–111)
Creatinine, Ser: 1.45 mg/dL — ABNORMAL HIGH (ref 0.61–1.24)
GFR, Estimated: 55 mL/min — ABNORMAL LOW (ref >60)
Glucose, Bld: 136 mg/dL — ABNORMAL HIGH (ref 70–99)
Potassium: 4.3 mmol/L (ref 3.5–5.1)
Sodium: 134 mmol/L — ABNORMAL LOW (ref 135–145)

## 2023-07-20 MED ORDER — FUROSEMIDE 40 MG PO TABS
40.0000 mg | ORAL_TABLET | Freq: Every day | ORAL | Status: DC
  Administered 2023-07-20 – 2023-07-21 (×2): 40 mg via ORAL
  Filled 2023-07-20 (×2): qty 1

## 2023-07-20 MED ORDER — SODIUM CHLORIDE 0.9% IV SOLUTION: Freq: Once | INTRAVENOUS | Status: AC

## 2023-07-20 MED ORDER — ONDANSETRON HCL 4 MG PO TABS
4.0000 mg | ORAL_TABLET | Freq: Once | ORAL | Status: DC
Start: 2023-07-21 — End: 2023-07-21

## 2023-07-20 MED ORDER — OXYCODONE HCL 5 MG PO TABS
2.5000 mg | ORAL_TABLET | Freq: Once | ORAL | Status: AC
  Administered 2023-07-20: 2.5 mg via ORAL
  Filled 2023-07-20: qty 1

## 2023-07-20 NOTE — Progress Notes (Signed)
 Occupational Therapy Treatment Patient Details Name: David [name] MRN: [id number] DOB: 1960/09/08 Today's Date: 07/20/2023   History of present illness 63 yo presented 07/12/23 after fall landing on left hip on the side of the tub. 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   OT comments  Patient progressing with adls and ambulation.  Able to come EOB with modified independence today and ambulated to the BR using RW and BSC place over the toilet.  Also able to ambulate in the hall for added exercise and returned to recliner after the session.  Movement is slow to accommodate the significant pain with transitional movements and mobility. Patient will benefit from continued inpatient follow up therapy, <3 hours/day and continued acute OT       If plan is discharge home, recommend the following:  A little help with walking and/or transfers;A lot of help with bathing/dressing/bathroom;Assistance with cooking/housework;Help with stairs or ramp for entrance;Assist for transportation   Equipment Recommendations  None recommended by OT    Recommendations for Other Services      Precautions / Restrictions Precautions Precautions: Fall Recall of Precautions/Restrictions: Intact Precaution/Restrictions Comments: Per Careers adviser post op note "No hip precautions" Restrictions Weight Bearing Restrictions Per Provider Order: Yes LLE Weight Bearing Per Provider Order: Weight bearing as tolerated       Mobility Bed Mobility Overal bed mobility: Needs Assistance Bed Mobility: Supine to Sit     Supine to sit: Supervision, HOB elevated (Used trapeze)     General bed mobility comments: Able to lift his LLE today    Transfers                         Balance Overall balance assessment: Needs assistance Sitting-balance support: No  upper extremity supported, Feet supported Sitting balance-Leahy Scale: Good                                     ADL either performed or assessed with clinical judgement   ADL Overall ADL's : Needs assistance/impaired                         Toilet Transfer: Contact guard assist;BSC/3in1;Ambulation (ambulated into bathroom with RW - able to side step in)           Functional mobility during ADLs: Contact guard assist General ADL Comments: [name] continues with increased pain, but will cooperate when pain meds coordinated    Extremity/Trunk Assessment Upper Extremity Assessment Upper Extremity Assessment: Overall WFL for tasks assessed   Lower Extremity Assessment Lower Extremity Assessment: Defer to PT evaluation        Vision       Perception     Praxis     Communication     Cognition Arousal: Alert Behavior During Therapy: [facility] for tasks assessed/performed                                 Following commands: Intact        Cueing   Cueing Techniques: Verbal cues  Exercises      Shoulder Instructions       General Comments VSS  Pertinent Vitals/ Pain       Pain Assessment Pain Assessment: Faces Pain Score: 8  Faces Pain Scale: Hurts even more Pain Location: left hip Pain Descriptors / Indicators: Discomfort, Guarding, Aching Pain Intervention(s): Monitored during session  Home Living                                          Prior Functioning/Environment              Frequency  Min 2X/week        Progress Toward Goals  OT Goals(current goals can now be found in the care plan section)  Progress towards OT goals: Progressing toward goals  Acute Rehab OT Goals OT Goal Formulation: With patient Time For Goal Achievement: 07/31/23 Potential to Achieve Goals: Good ADL Goals Pt Will Perform Grooming: with modified independence;standing Pt Will Perform Lower Body Bathing: with  modified independence;sit to/from stand Pt Will Perform Lower Body Dressing: with modified independence;sit to/from stand Pt Will Transfer to Toilet: with modified independence;ambulating  Plan      Co-evaluation                 AM-PAC OT "6 Clicks" Daily Activity     Outcome Measure   Help from another person eating meals?: None Help from another person taking care of personal grooming?: A Little Help from another person toileting, which includes using toliet, bedpan, or urinal?: A Little Help from another person bathing (including washing, rinsing, drying)?: A Lot Help from another person to put on and taking off regular upper body clothing?: A Little Help from another person to put on and taking off regular lower body clothing?: A Lot 6 Click Score: 17    End of Session Equipment Utilized During Treatment: Gait belt  OT Visit Diagnosis: Unsteadiness on feet (R26.81);History of falling (Z91.81);Pain Pain - Right/Left: Left Pain - part of body: Leg   Activity Tolerance Patient limited by pain   Patient Left in chair;with call bell/phone within reach   Nurse Communication          Time: 1131-1202 OT Time Calculation (min): 31 min  Charges: OT General Charges $OT Visit: 1 Visit OT Treatments $Self Care/Home Management : 23-37 mins  Chantal Comment OTR/L   Clementina Cutter 07/20/2023, 1:40 PM

## 2023-07-20 NOTE — TOC Progression Note (Addendum)
 Transition of Care [facility]) - Progression Note    Patient Details  Name: [name] MRN: [id number] Date of Birth: 24-Dec-1960  [facility] CM/SW Contact  Jannice Mends, LCSW Phone Number: 07/20/2023, 8:36 AM  Clinical Narrative:    8:37 AM-SNF [facility] still pending per [facility].  3:51 PM-[facility] has received authorization for tomorrow if stable.    Expected Discharge Plan: Skilled Nursing Facility Barriers to Discharge: English as a second language teacher, SNF Pending bed offer  Expected Discharge Plan and Services In-house Referral: Clinical Social Work   Post Acute Care Choice: Skilled Nursing Facility Living arrangements for the past 2 months: Single Family Home                                       Social Determinants of Health (SDOH) Interventions SDOH Screenings   Food Insecurity: Patient Declined (07/13/2023)  Recent Concern: Food Insecurity - Food Insecurity Present (05/28/2023)  Housing: Patient Declined (07/13/2023)  Recent Concern: Housing - High Risk (05/28/2023)  Transportation Needs: Patient Declined (07/13/2023)  Recent Concern: Transportation Needs - Unmet Transportation Needs (05/28/2023)  Utilities: Not At Risk (04/10/2023)  Alcohol Screen: Low Risk  (03/01/2023)  Depression (PHQ2-9): Medium Risk (06/22/2023)  Financial Resource Strain: High Risk (05/28/2023)  Physical Activity: Insufficiently Active (05/28/2023)  Social Connections: Unknown (05/28/2023)  Stress: Stress Concern Present (05/28/2023)  Tobacco Use: Medium Risk (07/14/2023)    Readmission Risk Interventions    07/18/2023   11:02 AM 09/06/2021   12:07 PM  Readmission Risk Prevention Plan  Post Dischage Appt  Complete  Medication Screening  Complete  Transportation Screening Complete Complete  Medication Review (RN Care Manager) Complete   PCP or Specialist appointment within 3-5 days of discharge Complete   HRI or Home Care Consult Complete   SW Recovery Care/Counseling Consult  Complete   Palliative Care Screening Not Applicable   Skilled Nursing Facility Complete

## 2023-07-20 NOTE — Progress Notes (Signed)
   07/20/23 1457  Mobility  Activity Transferred from chair to bed  Level of Assistance Contact guard assist, steadying assist  Assistive Device Front wheel walker  LLE Weight Bearing Per Provider Order WBAT  Activity Response Tolerated fair  Mobility Referral Yes  Mobility visit 1 Mobility  Mobility Specialist Start Time (ACUTE ONLY) 1457  Mobility Specialist Stop Time (ACUTE ONLY) 1505  Mobility Specialist Time Calculation (min) (ACUTE ONLY) 8 min   Mobility Specialist: Progress Note  Post-Mobility:    HR  80  Pt agreeable to mobility session - requesting transfer back to bed- received in chair. C/o LLE pain throughout. Cues to sit slowly.  Returned to bed with all needs met - call bell within reach.   Isla Mari, BS Mobility Specialist Please contact via SecureChat or  Rehab office at [phone].

## 2023-07-20 NOTE — Plan of Care (Signed)
  Problem: Fluid Volume: Goal: Ability to maintain a balanced intake and output will improve Outcome: Progressing   Problem: Health Behavior/Discharge Planning: Goal: Ability to identify and utilize available resources and services will improve Outcome: Progressing Goal: Ability to manage health-related needs will improve Outcome: Progressing   Problem: Metabolic: Goal: Ability to maintain appropriate glucose levels will improve Outcome: Progressing   Problem: Nutritional: Goal: Maintenance of adequate nutrition will improve Outcome: Progressing Goal: Progress toward achieving an optimal weight will improve Outcome: Progressing   Problem: Skin Integrity: Goal: Risk for impaired skin integrity will decrease Outcome: Progressing   Problem: Tissue Perfusion: Goal: Adequacy of tissue perfusion will improve Outcome: Progressing   Problem: Health Behavior/Discharge Planning: Goal: Ability to manage health-related needs will improve Outcome: Progressing   Problem: Clinical Measurements: Goal: Ability to maintain clinical measurements within normal limits will improve Outcome: Progressing Goal: Will remain free from infection Outcome: Progressing Goal: Diagnostic test results will improve Outcome: Progressing Goal: Respiratory complications will improve Outcome: Progressing Goal: Cardiovascular complication will be avoided Outcome: Progressing   Problem: Activity: Goal: Risk for activity intolerance will decrease Outcome: Progressing   Problem: Nutrition: Goal: Adequate nutrition will be maintained Outcome: Progressing   Problem: Coping: Goal: Level of anxiety will decrease Outcome: Progressing   Problem: Elimination: Goal: Will not experience complications related to bowel motility Outcome: Progressing Goal: Will not experience complications related to urinary retention Outcome: Progressing   Problem: Pain Managment: Goal: General experience of comfort will  improve and/or be controlled Outcome: Progressing   Problem: Safety: Goal: Ability to remain free from injury will improve Outcome: Progressing   Problem: Skin Integrity: Goal: Risk for impaired skin integrity will decrease Outcome: Progressing   Problem: Education: Goal: Verbalization of understanding the information provided (i.e., activity precautions, restrictions, etc) will improve Outcome: Progressing Goal: Individualized Educational Video(s) Outcome: Progressing   Problem: Activity: Goal: Ability to ambulate and perform ADLs will improve Outcome: Progressing   Problem: Clinical Measurements: Goal: Postoperative complications will be avoided or minimized Outcome: Progressing   Problem: Self-Concept: Goal: Ability to maintain and perform role responsibilities to the fullest extent possible will improve Outcome: Progressing   Problem: Pain Management: Goal: Pain level will decrease Outcome: Progressing

## 2023-07-20 NOTE — Progress Notes (Signed)
 [facility] PROGRESS NOTE   David [name] JYN:[id number] DOB: 1960-11-26 DOA: 07/12/2023  PCP: [name], MD  Brief History:   63 y.o. male with medical history significant of chronic atrial fibrillation, heart failure with reduced ejection fraction, essential hypertension, CABG in 04/2023, CKD stage IIIb, non-insulin-dependent DM type II, and hyperlipidemia, spinal surgery for infection last year who had a mechanical fall at home resulting in pain in the left hip area.  Found to have left hip fracture.  Hospitalized for further management.   Consultants: Cardiology.  Orthopedics  Procedures:   Left total hip arthoplasty for femoral neck fracture by Dr. Jena Gauss on 07/14/2023  Subjective   Reports left hip pain, reports moved his bowels yesterday  Assessment/Plan:  Left hip fracture   This was secondary to mechanical fall -preop clearance provided by cardiology. - PT consult appreciated, status post total hip arthroplasty by Dr. Jena Gauss on 07/14/2023. - Adjusting as needed pain medications, deseeding IV Dilaudid anticipation for discharge, had a lengthy discussion with the patient today, will continue with current dose MS Contin, will have to consolidate his as needed opiate, discussed oxycodone versus Vicodin, with rather continue with oxycodone and instead of Vicodin, will keep on current dose oxycodone 7.5 mg p.o. every 4 hours as needed . - .Plan for SNF placement m  Coronary artery disease status post CABG in January 2025 -Patient reports he stopped taking aspirin few weeks ago, final decision will be up to his cardiologist as an outpatient to see if it is appropriate to resume as an outpatient, but for now I will keep holding especially with his significant anemia. - He is on Eliquis. -Continue carvedilol as tolerated by blood pressure and heart rate and statin.   Appreciate cardiology input. - History will target hemoglobin more than 8  Acute blood loss anemia -  Expected, postoperative. - Transfuse as needed.  - Target hemoglobin> 8 in the setting of known CAD and recent CABG. will transfuse 1 unit PRBC today, I have discussed with the patient, he is agreeable. - Continue with Eliquis, monitor closely.  Permanent atrial fibrillation Went into RVR was briefly placed on amiodarone drip now on oral amnio, blood pressure low hence Coreg dose had to be dropped, Eliquis as above.  Hypertension.  Blood pressure on the softer side.  - Cut back Coreg, hold Lasix on 07/15/2023, gentle IV fluids, minimize narcotic use and monitor.  Patient counseled.    History of back surgery.  Supportive care.  Chronically on narcotics.    Chronic pain.  - Continue home medication MS Contin 10 units twice daily, he is on Vicodin at home, currently oxycodone, please see above discussion  Chronic systolic CHF with recovered EF Echocardiogram from January showed LVEF of 65 to 70%.  No evidence for volume overload currently.  Continue beta-blocker as tolerated by blood pressure and heart rate.  Dose Lasix daily.  Will start on 40 mg p.o. daily, and uptitrate to home dose twice daily if blood pressure allows.  AKI on chronic kidney disease stage IIIb Continued baseline around 1.7, improved after holding Lasix and gentle IV fluids, avoid hypotension.  Hyperlipidemia Continue statin.  Peripheral neuropathy Continue Lyrica.  Hyperkalemia.  Received Lokelma.    History of BPH with urinary retention post hip surgery Continue Flomax, Foley placed 07/16/2023 after multiple episodes of urinary retention in excess of 500 cc -will attempt voiding [place] today, DC Foley catheter  Insulin-dependent diabetes mellitus type 2 HbA1c 7.4 in January.  On  basal insulin at home.  Continue glargine and SSI.,  Sliding scale changed to every 4 while NPO.  Lab Results  Component Value Date   HGBA1C 7.4 (H) 04/09/2023    CBG (last 3)  Recent Labs    07/19/23 1950 07/20/23 0909  07/20/23 1248  GLUCAP 169* 163* 144*     DVT Prophylaxis: Apixaban Code Status: Full code Family Communication: Discussed with patient Disposition Plan: SNF  Status is: Inpatient Remains inpatient appropriate because: Left hip fracture, atrial fibrillation  Medications: Scheduled:  sodium chloride   Intravenous Once   acetaminophen  500 mg Oral Q8H   amiodarone  200 mg Oral BID   apixaban  5 mg Oral BID   carvedilol  3.125 mg Oral BID WC   Chlorhexidine Gluconate Cloth  6 each Topical Daily   dapagliflozin propanediol  10 mg Oral QAC breakfast   docusate sodium  200 mg Oral BID   doxycycline  100 mg Oral Q12H   insulin aspart  0-15 Units Subcutaneous TID WC   insulin aspart  0-5 Units Subcutaneous QHS   insulin glargine-yfgn  18 Units Subcutaneous Daily   oxyCODONE  10 mg Oral Q12H   pantoprazole  40 mg Oral Daily   polyethylene glycol  17 g Oral BID   pregabalin  75 mg Oral BID   rosuvastatin  20 mg Oral Daily   senna-docusate  2 tablet Oral BID   tamsulosin  0.4 mg Oral QPC supper   Vitamin D (Ergocalciferol)  50,000 Units Oral Q7 days   Continuous:   WUJ:[id number], diltiazem, hydrALAZINE, methocarbamol, [DISCONTINUED] ondansetron **OR** ondansetron (ZOFRAN) IV, oxyCODONE  Objective:  Vital Signs  Vitals:   07/20/23 0400 07/20/23 0414 07/20/23 0804 07/20/23 1211  BP:  129/85 119/70 136/80  Pulse:    81  Resp: 17 15 17 15   Temp:  98.5 F (36.9 C) 97.9 F (36.6 C) 98.3 F (36.8 C)  TempSrc:  Oral Oral Oral  SpO2:  98% 97% 95%  Weight:      Height:        Intake/Output Summary (Last 24 hours) at 07/20/2023 1401 Last data filed at 07/20/2023 0938 Gross per 24 hour  Intake 600 ml  Output 5150 ml  Net -4550 ml   Filed Weights   07/12/23 2034 07/14/23 1034  Weight: 81.6 kg (P) 81.6 kg    Awake Alert, Oriented X 3, No new F.N deficits, Normal affect Symmetrical Chest wall movement, Good air movement bilaterally, CTAB RRR,No Gallops,Rubs or new  Murmurs, No Parasternal Heave +ve B.Sounds, Abd Soft, No tenderness, No rebound - guarding or rigidity. No Cyanosis, left hip postop site for stable with bandage intact    Lab Results:  Data Reviewed: I have personally reviewed following labs and reports of the imaging studies  CBC: Recent Labs  Lab 07/16/23 0656 07/17/23 0442 07/18/23 0436 07/19/23 0418 07/20/23 0423  WBC 7.1 7.3 6.4 7.7 7.0  HGB 8.6* 7.9* 7.9* 8.6* 8.0*  HCT 25.9* 23.1* 24.0* 26.2* 24.7*  MCV 90.6 88.8 90.2 91.3 90.8  PLT 115* 111* 129* 166 166    Basic Metabolic Panel: Recent Labs  Lab 07/15/23 0620 07/16/23 0656 07/17/23 0442 07/18/23 0436 07/19/23 0418 07/20/23 0423  NA 133* 130* 131* 133* 135 134*  K 5.4* 4.2 3.8 4.0 4.8 4.3  CL 98 97* 103 104 104 101  CO2 23 21* 19* 22 24 21*  GLUCOSE 239* 147* 146* 164* 157* 136*  BUN 38* 40* 41* 34* 32* 28*  CREATININE 2.18* 2.03* 1.69* 1.59* 1.68* 1.45*  CALCIUM 8.9 8.5* 8.3* 8.3* 8.8* 8.6*  MG 2.2 2.1 2.1 2.2 2.0  --     GFR: Estimated Creatinine Clearance: 58.7 mL/min (A) (by C-G formula based on SCr of 1.45 mg/dL (H)).  Liver Function Tests: No results for input(s): "AST", "ALT", "ALKPHOS", "BILITOT", "PROT", "ALBUMIN" in the last 168 hours.   Coagulation Profile: No results for input(s): "INR", "PROTIME" in the last 168 hours.   CBG: Recent Labs  Lab 07/19/23 1225 07/19/23 1637 07/19/23 1950 07/20/23 0909 07/20/23 1248  GLUCAP 159* 215* 169* 163* 144*   Radiology Studies: No results found.    LOS: 8 days   Signature  Seena Dadds M.D on 07/20/2023 at 2:01 PM   -  To page go to [email or web address]

## 2023-07-21 DIAGNOSIS — S7292XA Unspecified fracture of left femur, initial encounter for closed fracture: Secondary | ICD-10-CM | POA: Diagnosis not present

## 2023-07-21 LAB — CBC
HCT: 27.8 % — ABNORMAL LOW (ref 39.0–52.0)
Hemoglobin: 9.4 g/dL — ABNORMAL LOW (ref 13.0–17.0)
MCH: 30.3 pg (ref 26.0–34.0)
MCHC: 33.8 g/dL (ref 30.0–36.0)
MCV: 89.7 fL (ref 80.0–100.0)
Platelets: 180 10*3/uL (ref 150–400)
RBC: 3.1 MIL/uL — ABNORMAL LOW (ref 4.22–5.81)
RDW: 14.6 % (ref 11.5–15.5)
WBC: 8.5 10*3/uL (ref 4.0–10.5)
nRBC: 0.4 % — ABNORMAL HIGH (ref 0.0–0.2)

## 2023-07-21 LAB — TYPE AND SCREEN
ABO/RH(D): O NEG
Antibody Screen: NEGATIVE
Unit division: 0

## 2023-07-21 LAB — BPAM RBC
Blood Product Expiration Date: 202505082359
ISSUE DATE / TIME: 202504171704
Unit Type and Rh: 9500

## 2023-07-21 LAB — BASIC METABOLIC PANEL WITH GFR
Anion gap: 12 (ref 5–15)
BUN: 25 mg/dL — ABNORMAL HIGH (ref 8–23)
CO2: 20 mmol/L — ABNORMAL LOW (ref 22–32)
Calcium: 8.9 mg/dL (ref 8.9–10.3)
Chloride: 102 mmol/L (ref 98–111)
Creatinine, Ser: 1.51 mg/dL — ABNORMAL HIGH (ref 0.61–1.24)
GFR, Estimated: 52 mL/min — ABNORMAL LOW (ref >60)
Glucose, Bld: 160 mg/dL — ABNORMAL HIGH (ref 70–99)
Potassium: 4 mmol/L (ref 3.5–5.1)
Sodium: 134 mmol/L — ABNORMAL LOW (ref 135–145)

## 2023-07-21 LAB — GLUCOSE, CAPILLARY
Glucose-Capillary: 140 mg/dL — ABNORMAL HIGH (ref 70–99)
Glucose-Capillary: 146 mg/dL — ABNORMAL HIGH (ref 70–99)

## 2023-07-21 MED ORDER — HYDROCODONE-ACETAMINOPHEN 10-325 MG PO TABS: 1.0000 | ORAL_TABLET | ORAL | 0 refills | Status: AC | PRN

## 2023-07-21 MED ORDER — DOCUSATE SODIUM 100 MG PO CAPS: 200.0000 mg | ORAL_CAPSULE | Freq: Two times a day (BID) | ORAL | Status: AC

## 2023-07-21 MED ORDER — INSULIN ASPART 100 UNIT/ML IJ SOLN: 0.0000 [IU] | Freq: Three times a day (TID) | INTRAMUSCULAR | Status: AC

## 2023-07-21 MED ORDER — LANTUS SOLOSTAR 100 UNIT/ML ~~LOC~~ SOPN: 18.0000 [IU] | PEN_INJECTOR | Freq: Every day | SUBCUTANEOUS | Status: DC

## 2023-07-21 MED ORDER — OXYCONTIN 10 MG PO T12A: 10.0000 mg | EXTENDED_RELEASE_TABLET | Freq: Two times a day (BID) | ORAL | 0 refills | Status: AC

## 2023-07-21 MED ORDER — PREGABALIN 75 MG PO CAPS
75.0000 mg | ORAL_CAPSULE | Freq: Two times a day (BID) | ORAL | 0 refills | Status: DC
Start: 2023-07-21 — End: 2023-09-11

## 2023-07-21 MED ORDER — HYDROCODONE-ACETAMINOPHEN 10-325 MG PO TABS
1.0000 | ORAL_TABLET | ORAL | Status: DC | PRN
  Administered 2023-07-21: 1 via ORAL
  Filled 2023-07-21: qty 1

## 2023-07-21 MED ORDER — AMIODARONE HCL 200 MG PO TABS: 200.0000 mg | ORAL_TABLET | Freq: Every day | ORAL | Status: AC

## 2023-07-21 MED ORDER — OXYCODONE HCL 7.5 MG PO TABS: 7.5000 mg | ORAL_TABLET | ORAL | 0 refills | Status: DC | PRN

## 2023-07-21 MED ORDER — ALPRAZOLAM 0.5 MG PO TABS: 0.5000 mg | ORAL_TABLET | Freq: Every evening | ORAL | 0 refills | Status: AC | PRN

## 2023-07-21 NOTE — Progress Notes (Signed)
 Patient remains stable for discharge from ortho standpoint. PRN pain medication switched to Oxycodone  7.5 mg q 4 hours on 07/19/23. Patient appears to be tolerating this well.   D/C recommendations: - Oxycodone  7.5 mg q 4 hours PRN and Oxycontin  10 mg BID for pain control - Home dose Eliquis  for DVT prophylaxis - Continue 50,000 units Vit D2 supplementation q 7 days x 8 weeks  Follow - up plan: 2 weeks after d/c for wound check and repeat x-rays     Contact information:  Katheryne Pane MD, Alona Jamaica PA-C. After hours and holidays please check [email or web address] for group call information for [facility]  Edilia Gordon PA-C [facility] [phone] (office) [email or web address]

## 2023-07-21 NOTE — Progress Notes (Signed)
 AVS completed/printed; placed in patient chart. Unable to sit in chair for extended time.

## 2023-07-21 NOTE — Plan of Care (Signed)
  Problem: Health Behavior/Discharge Planning: Goal: Ability to identify and utilize available resources and services will improve Outcome: Progressing Goal: Ability to manage health-related needs will improve Outcome: Progressing   Problem: Metabolic: Goal: Ability to maintain appropriate glucose levels will improve Outcome: Progressing   Problem: Health Behavior/Discharge Planning: Goal: Ability to manage health-related needs will improve Outcome: Progressing

## 2023-07-21 NOTE — Discharge Summary (Signed)
 Physician Discharge Summary  David [name]:[id number] DOB: 03-29-1961 DOA: 07/12/2023  PCP: David Mulberry, MD  Admit date: 07/12/2023 Discharge date: 07/21/2023  Admitted From: (Home) Disposition:  (SNF)  Recommendations for Outpatient Follow-up:  Please obtain BMP/CBC in 3 days Patient to follow with orthopedic in 2 weeks postdischarge wound check and repeat x-rays with Dr. Ernestina Headland [name]. He is on short-term amiodarone  for 10 days on discharge, then can be discontinued.   Diet recommendation: Heart Healthy / Carb Modified   Brief/Interim Summary: 63 y.o. male with medical history significant of chronic atrial fibrillation, heart failure with reduced ejection fraction, essential hypertension, CABG in 04/2023, CKD stage IIIb, non-insulin -dependent DM type II, and hyperlipidemia, spinal surgery for infection last year who had a mechanical fall at home resulting in pain in the left hip area.  Found to have left hip fracture.  Hospitalized for further management.   Left hip fracture   This was secondary to mechanical fall -preop clearance provided by cardiology. - PT consult appreciated, status post total hip arthroplasty by Dr. Curtiss [name] on 07/14/2023. - Adjusting as needed pain medications, have discussed with patient again today, and he rather go back to Vicodin 1 tablet every 4 hours instead of oxycodone , so this changes has been made upon his request.   - .Plan for SNF placement  - Sinew with vitamin D , to follow-up with Ortho in 2 weeks for repeat x-rays and wound check   Coronary artery disease status post CABG in January 2025 -Patient reports he stopped taking aspirin  few weeks ago, final decision will be up to his cardiologist as an outpatient to see if it is appropriate to resume as an outpatient, but for now I will keep holding especially with his significant anemia.  Can be addressed by primary cardiologist as an outpatient if his hemoglobin is stable. - He is on Eliquis . -Continue  carvedilol  as tolerated by blood pressure and heart rate and statin.   Appreciate cardiology input. - When history of CAD will target hemoglobin more than 8, so he was transfused 1 unit PRBC yesterday with good response   Acute blood loss anemia - Expected, postoperative. - Transfuse as needed.  - Target hemoglobin> 8 in the setting of known CAD and recent CABG. was transfused 1 unit PRBC yesterday, hemoglobin stable this morning at 9.4    Permanent atrial fibrillation with RVR Went into RVR was briefly placed on amiodarone  drip, transition to p.o. amnio, heart rate has been controlled, recommendation for short amnio course on discharge, she will continue with amiodarone  200 mg p.o. daily x 10 days then discontinue and continue with home dose Coreg  . - Continue with Eliquis .      Hypertension.  Blood pressure on the softer side.  - Decrease his Coreg  initially given soft blood pressure and held Lasix , but overall blood pressure has been stable after blood transfusion, and actually on the higher side, so Coreg  has been increased to home dose.    History of back surgery.  Supportive care.  Chronically on narcotics.     Chronic pain.  - Continue home medication MS Contin  10 units twice daily, he is on Vicodin at home, discharged on his home dose MS Contin , will increase Vicodin to every 4 hours after discussing with him.  Chronic systolic CHF with recovered EF Echocardiogram from January showed LVEF of 65 to 70%.  No evidence for volume overload currently.  Continue beta-blocker as tolerated by blood pressure and heart rate.  Continue with home  dose Lasix    AKI on chronic kidney disease stage IIIb Continued baseline around 1.7, improved after holding Lasix  and gentle IV fluids, avoid hypotension.   Hyperlipidemia Continue statin.   Peripheral neuropathy Continue Lyrica .   Hyperkalemia.  Received Lokelma .     History of BPH with urinary retention post hip surgery Continue Flomax ,  Foley placed 07/16/2023 after multiple episodes of urinary retention in excess of 500 cc -he passed voiding trial, no evidence of retention over the last 48 hours   Insulin -dependent diabetes mellitus type 2 HbA1c 7.4 in January.  On basal insulin  at home.  Dose has been adjusted   Discharge Diagnoses:  Principal Problem:   Closed left femoral fracture (HCC) Active Problems:   Essential hypertension   Insulin  dependent type 2 diabetes mellitus (HCC)   Permanent atrial fibrillation (HCC)   History of CABG in  04/2023   History of CHF (congestive heart failure)   Chronic kidney disease (CKD), stage III (moderate) (HCC)   Hyperlipidemia   BPH (benign prostatic hyperplasia)   Peripheral neuropathy   Left displaced femoral neck fracture [facility])    Discharge Instructions    Orthopaedic Trauma Service Discharge Instructions   General Discharge Instructions  WEIGHT BEARING STATUS:weightbearing as tolerated  RANGE OF MOTION/ACTIVITY: Ok for hip motion as tolerated. No posterior hip precautions needed  Wound Care:  Leave dressing in place until follow-up. You are ok to shower and get Aquacel bandage wet. If water gets caught under your bandage or the bandage gets soiled, please remove and wash incision daily with soap and water. In this case, change bandage daily   DVT/PE prophylaxis: Home dose Eliquis   Diet: as you were eating previously.  Can use over the counter stool softeners and bowel preparations, such as Miralax , to help with bowel movements.  Narcotics can be constipating.  Be sure to drink plenty of fluids  PAIN MEDICATION USE AND EXPECTATIONS  You have likely been given narcotic medications to help control your pain.  After a traumatic event that results in an fracture (broken bone) with or without surgery, it is ok to use narcotic pain medications to help control one's pain.  We understand that everyone responds to pain differently and each individual patient will be evaluated  on a regular basis for the continued need for narcotic medications. Ideally, narcotic medication use should last no more than 6-8 weeks (coinciding with fracture healing).   As a patient it is your responsibility as well to monitor narcotic medication use and report the amount and frequency you use these medications when you come to your office visit.   We would also advise that if you are using narcotic medications, you should take a dose prior to therapy to maximize you participation.  IF YOU ARE ON NARCOTIC MEDICATIONS IT IS NOT PERMISSIBLE TO OPERATE A MOTOR VEHICLE (MOTORCYCLE/CAR/TRUCK/MOPED) OR HEAVY MACHINERY DO NOT MIX NARCOTICS WITH OTHER CNS (CENTRAL NERVOUS SYSTEM) DEPRESSANTS SUCH AS ALCOHOL  POST-OPERATIVE OPIOID TAPER INSTRUCTIONS: It is important to wean off of your opioid medication as soon as possible. If you do not need pain medication after your surgery it is ok to stop day one. Opioids include: Codeine, Hydrocodone (Norco, Vicodin), Oxycodone (Percocet, oxycontin ) and hydromorphone  amongst others.  Long term and even short term use of opiods can cause: Increased pain response Dependence Constipation Depression Respiratory depression And more.  Withdrawal symptoms can include Flu like symptoms Nausea, vomiting And more Techniques to manage these symptoms Hydrate well Eat regular healthy meals Stay active Use  relaxation techniques(deep breathing, meditating, yoga) Do Not substitute Alcohol to help with tapering If you have been on opioids for less than two weeks and do not have pain than it is ok to stop all together.  Plan to wean off of opioids This plan should start within one week post op of your fracture surgery  Maintain the same interval or time between taking each dose and first decrease the dose.  Cut the total daily intake of opioids by one tablet each day Next start to increase the time between doses. The last dose that should be eliminated is the evening  dose.    STOP SMOKING OR USING NICOTINE PRODUCTS!!!!  As discussed nicotine severely impairs your body's ability to heal surgical and traumatic wounds but also impairs bone healing.  Wounds and bone heal by forming microscopic blood vessels (angiogenesis) and nicotine is a vasoconstrictor (essentially, shrinks blood vessels).  Therefore, if vasoconstriction occurs to these microscopic blood vessels they essentially disappear and are unable to deliver necessary nutrients to the healing tissue.  This is one modifiable factor that you can do to dramatically increase your chances of healing your injury.  (This means no smoking, no nicotine gum, patches, etc)  DO NOT USE NONSTEROIDAL ANTI-INFLAMMATORY DRUGS (NSAID'S)  Using products such as Advil  (ibuprofen ), Aleve (naproxen), Motrin  (ibuprofen ) for additional pain control during fracture healing can delay and/or prevent the healing response.  If you would like to take over the counter (OTC) medication, Tylenol  (acetaminophen ) is ok.  However, some narcotic medications that are given for pain control contain acetaminophen  as well. Therefore, you should not exceed more than 4000 mg of tylenol  in a day if you do not have liver disease.  Also note that there are may OTC medicines, such as cold medicines and allergy medicines that my contain tylenol  as well.  If you have any questions about medications and/or interactions please ask your doctor/PA or your pharmacist.      ICE AND ELEVATE INJURED/OPERATIVE EXTREMITY  Using ice and elevating the injured extremity above your heart can help with swelling and pain control.  Icing in a pulsatile fashion, such as 20 minutes on and 20 minutes off, can be followed.    Do not place ice directly on skin. Make sure there is a barrier between to skin and the ice pack.    Using frozen items such as frozen peas works well as the conform nicely to the are that needs to be iced.  USE AN ACE WRAP OR TED HOSE FOR SWELLING  CONTROL  In addition to icing and elevation, Ace wraps or TED hose are used to help limit and resolve swelling.  It is recommended to use Ace wraps or TED hose until you are informed to stop.    When using Ace Wraps start the wrapping distally (farthest away from the body) and wrap proximally (closer to the body)   Example: If you had surgery on your leg or thing and you do not have a splint on, start the ace wrap at the toes and work your way up to the thigh        If you had surgery on your upper extremity and do not have a splint on, start the ace wrap at your fingers and work your way up to the upper arm   CALL THE OFFICE FOR MEDICATION REFILLS OR WITH ANY QUESTIONS/CONCERNS: [phone]   VISIT OUR WEBSITE FOR ADDITIONAL INFORMATION: [email or web address]  Discharge Wound Care Instructions  Do NOT  apply any ointments, solutions or lotions to pin sites or surgical wounds.  These prevent needed drainage and even though solutions like hydrogen peroxide kill bacteria, they also damage cells lining the pin sites that help fight infection.  Applying lotions or ointments can keep the wounds moist and can cause them to breakdown and open up as well. This can increase the risk for infection. When in doubt call the office.  Surgical incisions should be dressed daily.   Call office for the following: Temperature greater than 101F Persistent nausea and vomiting Severe uncontrolled pain Redness, tenderness, or signs of infection (pain, swelling, redness, odor or green/yellow discharge around the site) Difficulty breathing, headache or visual disturbances Hives Persistent dizziness or light-headedness Extreme fatigue Any other questions or concerns you may have after discharge  In an emergency, call 911 or go to an Emergency Department at a nearby hospital  OTHER HELPFUL INFORMATION  If you had a block, it will wear off between 8-24 hrs postop typically.  This is period when your pain may go  from nearly zero to the pain you would have had postop without the block.  This is an abrupt transition but nothing dangerous is happening.  You may take an extra dose of narcotic when this happens.  You should wean off your narcotic medicines as soon as you are able.  Most patients will be off or using minimal narcotics before their first postop appointment.   We suggest you use the pain medication the first night prior to going to bed, in order to ease any pain when the anesthesia wears off. You should avoid taking pain medications on an empty stomach as it will make you nauseous.  Do not drink alcoholic beverages or take illicit drugs when taking pain medications.  In most states it is against the law to drive while you are in a splint or sling.  And certainly against the law to drive while taking narcotics.  You may return to work/school in the next couple of days when you feel up to it.   Pain medication may make you constipated.  Below are a few solutions to try in this order: Decrease the amount of pain medication if you aren't having pain. Drink lots of decaffeinated fluids. Drink prune juice and/or each dried prunes  If the first 3 don't work start with additional solutions Take Colace - an over-the-counter stool softener Take Senokot - an over-the-counter laxative Take Miralax  - a stronger over-the-counter laxative     Discharge Instructions     Diet - low sodium heart healthy   Complete by: As directed    Discharge instructions   Complete by: As directed    Get CBC, CMP, checked  by Primary MD next visit.  Disposition SNF   Diet: Heart Healthy /carb modified  For Heart failure patients - Check your Weight same time everyday, if you gain over 2 pounds, or you develop in leg swelling, experience more shortness of breath or chest pain, call your Primary MD immediately. Follow Cardiac Low Salt Diet and 1.5 lit/day fluid restriction.   On your next visit with your  primary care physician please Get Medicines reviewed and adjusted.   Please request your Prim.MD to go over all Hospital Tests and Procedure/Radiological results at the follow up, please get all Hospital records sent to your Prim MD by signing hospital release before you go home.   If you experience worsening of your admission symptoms, develop shortness of breath, life threatening  emergency, suicidal or homicidal thoughts you must seek medical attention immediately by calling 911 or calling your MD immediately  if symptoms less severe.  You Must read complete instructions/literature along with all the possible adverse reactions/side effects for all the Medicines you take and that have been prescribed to you. Take any new Medicines after you have completely understood and accpet all the possible adverse reactions/side effects.   Do not drive, operating heavy machinery, perform activities at heights, swimming or participation in water activities or provide baby sitting services if your were admitted for syncope or siezures until you have seen by Primary MD or a Neurologist and advised to do so again.  Do not drive when taking Pain medications.    Do not take more than prescribed Pain, Sleep and Anxiety Medications  Special Instructions: If you have smoked or chewed Tobacco  in the last 2 yrs please stop smoking, stop any regular Alcohol  and or any Recreational drug use.  Wear Seat belts while driving.   Please note  You were cared for by a hospitalist during your hospital stay. If you have any questions about your discharge medications or the care you received while you were in the hospital after you are discharged, you can call the unit and asked to speak with the hospitalist on call if the hospitalist that took care of you is not available. Once you are discharged, your primary care physician will handle any further medical issues. Please note that NO REFILLS for any discharge medications  will be authorized once you are discharged, as it is imperative that you return to your primary care physician (or establish a relationship with a primary care physician if you do not have one) for your aftercare needs so that they can reassess your need for medications and monitor your lab values.   Increase activity slowly   Complete by: As directed       Allergies as of 07/21/2023   No Known Allergies      Medication List     STOP taking these medications    Misc. Devices Misc   terbinafine  250 MG tablet Commonly known as: LAMISIL        TAKE these medications    ALPRAZolam  0.5 MG tablet Commonly known as: XANAX  Take 1 tablet (0.5 mg total) by mouth at bedtime as needed for anxiety or sleep.   amiodarone  200 MG tablet Commonly known as: PACERONE  Take 1 tablet (200 mg total) by mouth daily for 10 days.   apixaban  5 MG Tabs tablet Commonly known as: ELIQUIS  Take 1 tablet (5 mg total) by mouth 2 (two) times daily.   carvedilol  6.25 MG tablet Commonly known as: COREG  Take 1 tablet (6.25 mg total) by mouth 2 (two) times daily.   Dexcom G7 Sensor Misc Use as directed & change every 10 days.   docusate sodium  100 MG capsule Commonly known as: COLACE Take 2 capsules (200 mg total) by mouth 2 (two) times daily.   doxycycline  100 MG tablet Commonly known as: VIBRA -TABS Take 1 tablet (100 mg total) by mouth every 12 (twelve) hours.   famotidine  20 MG tablet Commonly known as: PEPCID  Take 1 tablet (20 mg total) by mouth 2 times daily.   Farxiga  10 MG Tabs tablet Generic drug: dapagliflozin  propanediol Take 1 tablet (10 mg total) by mouth daily before breakfast.   furosemide  40 MG tablet Commonly known as: LASIX  Take 1 tablet (40 mg total) by mouth 2 (two) times daily. What changed:  when to take this   HYDROcodone -acetaminophen  10-325 MG tablet Commonly known as: NORCO Take 1 tablet by mouth every 4 (four) hours as needed for moderate pain (pain score  4-6). What changed: reasons to take this   insulin  aspart 100 UNIT/ML injection Commonly known as: novoLOG  Inject 0-15 Units into the skin 3 (three) times daily with meals. insulin  aspart (novoLOG ) injection 0-15 Units  0-15 Units, Subcutaneous, 3 times daily with meals, First dose on Tue 07/18/23 at 1200 Correction coverage: Moderate (average weight, post-op) CBG < 70: Implement Hypoglycemia Standing Orders and refer to Hypoglycemia Standing Orders sidebar report CBG 70 - 120: 0 units CBG 121 - 150: 2 units CBG 151 - 200: 3 units CBG 201 - 250: 5 units CBG 251 - 300: 8 units CBG 301 - 350: 11 units CBG 351 - 400: 15 units   Lantus  SoloStar 100 UNIT/ML Solostar Pen Generic drug: insulin  glargine Inject 18 Units into the skin daily. What changed: how much to take   methocarbamol  750 MG tablet Commonly known as: ROBAXIN  Take 1 tablet (750 mg total) by mouth every 8 (eight) hours as needed for muscle spasms. What changed: when to take this   Narcan  4 MG/0.1ML Liqd nasal spray kit Generic drug: naloxone  Opiates overdose What changed:  how much to take how to take this when to take this   OxyCONTIN  10 MG 12 hr tablet Generic drug: oxyCODONE  Take 1 tablet (10 mg total) by mouth every 12 (twelve) hours.   PEG 3350  17 g Pack Mix 1 packet (17 g) in beverage and take by mouth 2 (two) times daily. What changed: when to take this   Potassium Chloride  ER 20 MEQ Tbcr Take 20 mEq by mouth daily.   pregabalin  75 MG capsule Commonly known as: LYRICA  Take 1 capsule (75 mg total) by mouth 2 (two) times daily.   Repatha  SureClick 140 MG/ML Soaj Generic drug: Evolocumab  Inject 140 mg into the skin every 14 (fourteen) days.   rosuvastatin  20 MG tablet Commonly known as: Crestor  Take 1 tablet (20 mg total) by mouth daily.   tamsulosin  0.4 MG Caps capsule Commonly known as: FLOMAX  Take 1 capsule (0.4 mg total) by mouth daily after supper.   Vitamin D  (Ergocalciferol ) 1.25 MG (50000 UNIT)  Caps capsule Commonly known as: DRISDOL  Take 1 capsule (50,000 Units total) by mouth every 7 (seven) days.        Contact information for follow-up providers     [name], Florentina Huntsman, MD. Schedule an appointment as soon as possible for a visit in 2 week(s).   Specialty: Orthopedic Surgery Why: for wound check and repeat x-rays Contact information: [address] [place] [address] [phone]              Contact information for after-discharge care     Destination     [facility] .   Service: Skilled Nursing Contact information: [address] [place]  [address] [phone]                    No Known Allergies  Consultations: Ortho cardiology  Procedures/Studies: DG Chest Port 1 View Result Date: 07/16/2023 CLINICAL DATA:  Shortness of breath EXAM: PORTABLE CHEST 1 VIEW COMPARISON:  07/12/2023 FINDINGS: Cardiomegaly which is stable. Prior CABG and left atrial clipping. Stable congested appearance of vessels without Kerley lines, air bronchogram, effusion, or pneumothorax. IMPRESSION: Chronic cardiomegaly and vascular congestion. Electronically Signed   By: Treva Fuchs.D.  On: 07/16/2023 07:36   DG HIP PORT UNILAT W OR W/O PELVIS 1V LEFT Result Date: 07/14/2023 CLINICAL DATA:  Status post total left hip arthroplasty. EXAM: DG HIP (WITH OR WITHOUT PELVIS) 1V PORT LEFT COMPARISON:  Left femur radiographs 07/13/2023, left hip radiographs 07/12/2023 FINDINGS: Interval total left hip arthroplasty. No perihardware lucency is seen to indicate hardware failure or loosening. Moderate right femoroacetabular joint space narrowing and superolateral right acetabular degenerative osteophytosis. Mild bilateral sacroiliac subchondral sclerosis. The pubic symphysis joint space is maintained. Moderate to high-grade atherosclerotic calcifications are again seen. Surgical clips overlie the inferior left hip joint. IMPRESSION: Interval total  left hip arthroplasty without evidence of hardware failure. Electronically Signed   By: Bertina Broccoli M.D.   On: 07/14/2023 15:48   DG HIP UNILAT WITH PELVIS 1V LEFT Result Date: 07/14/2023 CLINICAL DATA:  Anterior total left hip arthroplasty. EXAM: DG HIP (WITH OR WITHOUT PELVIS) 1V*L* COMPARISON:  Left femur radiographs 07/13/2023 FINDINGS: Images were performed intraoperatively without the presence of a radiologist. The patient is undergoing total left hip arthroplasty. No hardware complication is seen. Total fluoroscopy images: 5 Total fluoroscopy time: 40 seconds Total dose: Radiation Exposure Index (as provided by the fluoroscopic device): 5.74 mGy air Kerma Please see intraoperative findings for further detail. IMPRESSION: Intraoperative fluoroscopy for total left hip arthroplasty. Electronically Signed   By: Bertina Broccoli M.D.   On: 07/14/2023 15:46   DG C-Arm 1-60 Min-No Report Result Date: 07/14/2023 Fluoroscopy was utilized by the requesting physician.  No radiographic interpretation.   DG C-Arm 1-60 Min-No Report Result Date: 07/14/2023 Fluoroscopy was utilized by the requesting physician.  No radiographic interpretation.   DG FEMUR MIN 2 VIEWS LEFT Result Date: 07/13/2023 CLINICAL DATA:  Hip fracture. EXAM: LEFT FEMUR 2 VIEWS COMPARISON:  Hip radiograph yesterday FINDINGS: Left femoral neck fracture with mild displacement and angulation. The distal femur is intact. No additional femur fracture. Knee alignment is maintained. Vascular calcifications are seen. IMPRESSION: Left femoral neck fracture with mild displacement and angulation. Electronically Signed   By: Chadwick Colonel M.D.   On: 07/13/2023 15:12   CT HEAD WO CONTRAST ( ) Result Date: 07/13/2023 CLINICAL DATA:  Trauma fell getting out of shower EXAM: CT HEAD WITHOUT CONTRAST TECHNIQUE: Contiguous axial images were obtained from the base of the skull through the vertex without intravenous contrast. RADIATION DOSE REDUCTION: This  exam was performed according to the departmental dose-optimization program which includes automated exposure control, adjustment of the mA and/or kV according to patient size and/or use of iterative reconstruction technique. COMPARISON:  CT brain 02/20/2023 FINDINGS: Brain: No acute territorial infarction, hemorrhage or intracranial mass. Mild atrophy. Mild chronic small vessel ischemic changes of the white matter. The ventricles are nonenlarged Vascular: No hyperdense vessels.  Carotid vascular calcification Skull: Normal. Negative for fracture or focal lesion. Sinuses/Orbits: Moderate mucosal thickening in the sinuses. Chronic appearing nasal bone deformity Other: None IMPRESSION: 1. No CT evidence for acute intracranial abnormality. 2. Mild atrophy and chronic small vessel ischemic changes of the white matter. 3. Moderate mucosal thickening in the sinuses. Electronically Signed   By: Esmeralda Hedge M.D.   On: 07/13/2023 00:02   DG Hip Unilat W or Wo Pelvis 2-3 Views Left Result Date: 07/12/2023 CLINICAL DATA:  Left hip pain after fall. EXAM: DG HIP (WITH OR WITHOUT PELVIS) 2-3V LEFT COMPARISON:  None Available. FINDINGS: Displaced left femoral neck fracture. Femoral head is well seated in the acetabulum. No hip dislocation. Bony pelvis and pubic rami are  intact. Pubic symphysis and sacroiliac joints are congruent. IMPRESSION: Displaced left femoral neck fracture. Electronically Signed   By: Chadwick Colonel M.D.   On: 07/12/2023 23:37   DG Chest 1 View Result Date: 07/12/2023 CLINICAL DATA:  Fall getting out of the shower. EXAM: CHEST  1 VIEW COMPARISON:  Chest radiograph 05/24/2023 FINDINGS: Prior median sternotomy with left atrial clipping. The heart is enlarged. Stable mediastinal contours. Lower lung volumes from prior exam. Similar vascular congestion. No pneumothorax or large pleural effusion. On limited assessment, no acute osseous findings. IMPRESSION: Stable cardiomegaly with vascular congestion.  Electronically Signed   By: Chadwick Colonel M.D.   On: 07/12/2023 23:37      Subjective:  Patient asking to change his oxycodone  back to Vicodin as report he feels is giving him some nausea, otherwise no significant events.  Discharge Exam: Vitals:   07/20/23 2343 07/21/23 0247  BP: (!) 142/81 131/73  Pulse: 77 76  Resp: 19 18  Temp: 98.3 F (36.8 C) 98.9 F (37.2 C)  SpO2: 98% 97%   Vitals:   07/20/23 1735 07/20/23 2020 07/20/23 2343 07/21/23 0247  BP:   (!) 142/81 131/73  Pulse: 69  77 76  Resp: 16 17 19 18   Temp: 98 F (36.7 C) 98.1 F (36.7 C) 98.3 F (36.8 C) 98.9 F (37.2 C)  TempSrc:  Oral Oral Oral  SpO2: 99%  98% 97%  Weight:      Height:        General: Pt is alert, awake, not in acute distress Cardiovascular: IRR, S1/S2 +, no rubs, no gallops Respiratory: CTA bilaterally, no wheezing, no rhonchi Abdominal: Soft, NT, ND, bowel sounds + Extremities: no edema, no cyanosis    The results of significant diagnostics from this hospitalization (including imaging, microbiology, ancillary and laboratory) are listed below for reference.     Microbiology: Recent Results (from the past 240 hours)  Surgical pcr screen     Status: None   Collection Time: 07/13/23  4:18 PM   Specimen: Nasal Mucosa; Nasal Swab  Result Value Ref Range Status   MRSA, PCR NEGATIVE NEGATIVE Final   Staphylococcus aureus NEGATIVE NEGATIVE Final    Comment: (NOTE) The Xpert SA Assay (FDA approved for NASAL specimens in patients 71 years of age and older), is one component of a comprehensive surveillance program. It is not intended to diagnose infection nor to guide or monitor treatment. Performed at [facility], [address] [place], [place] [address]      Labs: BNP (last 3 results) Recent Labs    08/15/22 0400 04/09/23 1101 07/15/23 0620  BNP 193.9* 419.7* 126.5*   Basic Metabolic Panel: Recent Labs  Lab 07/15/23 0620 07/16/23 0656 07/17/23 0442  07/18/23 0436 07/19/23 0418 07/20/23 0423 07/21/23 0448  NA 133* 130* 131* 133* 135 134* 134*  K 5.4* 4.2 3.8 4.0 4.8 4.3 4.0  CL 98 97* 103 104 104 101 102  CO2 23 21* 19* 22 24 21* 20*  GLUCOSE 239* 147* 146* 164* 157* 136* 160*  BUN 38* 40* 41* 34* 32* 28* 25*  CREATININE 2.18* 2.03* 1.69* 1.59* 1.68* 1.45* 1.51*  CALCIUM  8.9 8.5* 8.3* 8.3* 8.8* 8.6* 8.9  MG 2.2 2.1 2.1 2.2 2.0  --   --    Liver Function Tests: No results for input(s): "AST", "ALT", "ALKPHOS", "BILITOT", "PROT", "ALBUMIN " in the last 168 hours. No results for input(s): "LIPASE", "AMYLASE" in the last 168 hours. No results for input(s): "AMMONIA" in  the last 168 hours. CBC: Recent Labs  Lab 07/17/23 0442 07/18/23 0436 07/19/23 0418 07/20/23 0423 07/21/23 0448  WBC 7.3 6.4 7.7 7.0 8.5  HGB 7.9* 7.9* 8.6* 8.0* 9.4*  HCT 23.1* 24.0* 26.2* 24.7* 27.8*  MCV 88.8 90.2 91.3 90.8 89.7  PLT 111* 129* 166 166 180   Cardiac Enzymes: No results for input(s): "CKTOTAL", "CKMB", "CKMBINDEX", "TROPONINI" in the last 168 hours. BNP: Invalid input(s): "POCBNP" CBG: Recent Labs  Lab 07/20/23 0909 07/20/23 1248 07/20/23 1620 07/20/23 2145 07/21/23 0750  GLUCAP 163* 144* 171* 144* 140*   D-Dimer No results for input(s): "DDIMER" in the last 72 hours. Hgb A1c No results for input(s): "HGBA1C" in the last 72 hours. Lipid Profile No results for input(s): "CHOL", "HDL", "LDLCALC", "TRIG", "CHOLHDL", "LDLDIRECT" in the last 72 hours. Thyroid function studies No results for input(s): "TSH", "T4TOTAL", "T3FREE", "THYROIDAB" in the last 72 hours.  Invalid input(s): "FREET3" Anemia work up No results for input(s): "VITAMINB12", "FOLATE", "FERRITIN", "TIBC", "IRON", "RETICCTPCT" in the last 72 hours. Urinalysis    Component Value Date/Time   COLORURINE YELLOW 04/22/2023 0642   APPEARANCEUR HAZY (A) 04/22/2023 0642   LABSPEC 1.017 04/22/2023 0642   PHURINE 5.0 04/22/2023 0642   GLUCOSEU NEGATIVE 04/22/2023 0642    HGBUR SMALL (A) 04/22/2023 0642   BILIRUBINUR NEGATIVE 04/22/2023 0642   BILIRUBINUR negative 09/25/2019 1555   KETONESUR NEGATIVE 04/22/2023 0642   PROTEINUR 100 (A) 04/22/2023 0642   UROBILINOGEN 1.0 09/25/2019 1555   NITRITE NEGATIVE 04/22/2023 0642   LEUKOCYTESUR TRACE (A) 04/22/2023 0642   Sepsis Labs Recent Labs  Lab 07/18/23 0436 07/19/23 0418 07/20/23 0423 07/21/23 0448  WBC 6.4 7.7 7.0 8.5   Microbiology Recent Results (from the past 240 hours)  Surgical pcr screen     Status: None   Collection Time: 07/13/23  4:18 PM   Specimen: Nasal Mucosa; Nasal Swab  Result Value Ref Range Status   MRSA, PCR NEGATIVE NEGATIVE Final   Staphylococcus aureus NEGATIVE NEGATIVE Final    Comment: (NOTE) The Xpert SA Assay (FDA approved for NASAL specimens in patients 69 years of age and older), is one component of a comprehensive surveillance program. It is not intended to diagnose infection nor to guide or monitor treatment. Performed at [facility], [address] [place], [place] [address]      Time coordinating discharge: Over 30 minutes  SIGNED:   Seena Dadds, MD  [facility] 07/21/2023, 10:42 AM Pager   If 7PM-7AM, please contact night-coverage [email or web address] Cardiology

## 2023-07-21 NOTE — TOC Transition Note (Signed)
 Transition of Care [facility]) - Discharge Note   Patient Details  Name: David [name] MRN: [id number] Date of Birth: [birth year]  Transition [facility]) CM/SW Contact:  Jannice Mends, LCSW Phone Number: 07/21/2023, 1:47 PM   Clinical Narrative:    Patient will DC to: [place] Anticipated DC date: 07/21/23 Family notified: Pt notified family Transport by: Lyna Sandhoff called at 1:45pm   Per MD patient ready for DC to [facility]. RN to call report prior to discharge [phone] room 216). RN, patient, patient's family, and facility notified of DC. Discharge Summary and FL2 sent to facility. DC packet on chart including signed scripts. Ambulance transport requested for patient.   CSW will sign off for now as social work intervention is no longer needed. Please consult us  again if new needs arise.     Final next level of care: Skilled Nursing Facility Barriers to Discharge: Barriers Resolved   Patient Goals and CMS Choice Patient states their goals for this hospitalization and ongoing recovery are:: Rehab CMS Medicare.gov Compare Post Acute Care list provided to:: Patient Choice offered to / list presented to : Patient [place] ownership interest in [facility].provided to:: Patient    Discharge Placement   Existing PASRR number confirmed : 07/20/23          Patient [facility]: [facility] Patient to be transferred to facility by: PT[facility] Name of family member notified: Spouse Patient and family notified of of transfer: 07/21/23  Discharge Plan and Services Additional resources added to the After Visit Summary for   In-house Referral: Clinical Social Work   Post Acute Care Choice: Skilled Nursing Facility                               Social Drivers of Health (SDOH) Interventions SDOH Screenings   Food Insecurity: Patient Declined (07/13/2023)  Recent Concern: Food Insecurity - Food Insecurity Present (05/28/2023)  Housing: Patient Declined (07/13/2023)   Recent Concern: Housing - High Risk (05/28/2023)  Transportation Needs: Patient Declined (07/13/2023)  Recent Concern: Transportation Needs - Unmet Transportation Needs (05/28/2023)  Utilities: Not At Risk (04/10/2023)  Alcohol Screen: Low Risk  (03/01/2023)  Depression (PHQ2-9): Medium Risk (06/22/2023)  Financial Resource Strain: High Risk (05/28/2023)  Physical Activity: Insufficiently Active (05/28/2023)  Social Connections: Unknown (05/28/2023)  Stress: Stress Concern Present (05/28/2023)  Tobacco Use: Medium Risk (07/14/2023)     Readmission Risk Interventions    07/18/2023   11:02 AM 09/06/2021   12:07 PM  Readmission Risk Prevention Plan  Post Dischage Appt  Complete  Medication Screening  Complete  Transportation Screening Complete Complete  Medication Review (RN Care Manager) Complete   PCP or Specialist appointment within 3-5 days of discharge Complete   HRI or Home Care Consult Complete   SW Recovery Care/Counseling Consult Complete   Palliative Care Screening Not Applicable   Skilled Nursing Facility Complete

## 2023-07-21 NOTE — TOC Progression Note (Signed)
 Transition of Care [facility]) - Progression Note    Patient Details  Name: David [name] MRN: [id number] Date of Birth: 1960-10-07  [facility]) CM/SW Contact  Inocente GORMAN Kindle, LCSW Phone Number: 07/21/2023, 10:24 AM  Clinical Narrative:    [facility] ready for patient. Patient requesting [facility] for transport.    Expected Discharge Plan: Skilled Nursing Facility Barriers to Discharge: Barriers Resolved  Expected Discharge Plan and Services In-house Referral: Clinical Social Work   Post Acute Care Choice: Skilled Nursing Facility Living arrangements for the past 2 months: Single Family Home                                       Social Determinants of Health (SDOH) Interventions SDOH Screenings   Food Insecurity: Patient Declined (07/13/2023)  Recent Concern: Food Insecurity - Food Insecurity Present (05/28/2023)  Housing: Patient Declined (07/13/2023)  Recent Concern: Housing - High Risk (05/28/2023)  Transportation Needs: Patient Declined (07/13/2023)  Recent Concern: Transportation Needs - Unmet Transportation Needs (05/28/2023)  Utilities: Not At Risk (04/10/2023)  Alcohol Screen: Low Risk  (03/01/2023)  Depression (PHQ2-9): Medium Risk (06/22/2023)  Financial Resource Strain: High Risk (05/28/2023)  Physical Activity: Insufficiently Active (05/28/2023)  Social Connections: Unknown (05/28/2023)  Stress: Stress Concern Present (05/28/2023)  Tobacco Use: Medium Risk (07/14/2023)    Readmission Risk Interventions    07/18/2023   11:02 AM 09/06/2021   12:07 PM  Readmission Risk Prevention Plan  Post Dischage Appt  Complete  Medication Screening  Complete  Transportation Screening Complete Complete  Medication Review (RN Care Manager) Complete   PCP or Specialist appointment within 3-5 days of discharge Complete   HRI or Home Care Consult Complete   SW Recovery Care/Counseling Consult Complete   Palliative Care Screening Not Applicable   Skilled Nursing Facility  Complete

## 2023-08-16 ENCOUNTER — Encounter (HOSPITAL_COMMUNITY): Payer: Self-pay | Admitting: Surgery

## 2023-08-21 ENCOUNTER — Telehealth: Payer: Self-pay | Admitting: Family Medicine

## 2023-08-21 NOTE — Telephone Encounter (Signed)
Verbal orders given for patient.

## 2023-08-21 NOTE — Telephone Encounter (Signed)
 Copied from CRM [id number]. Topic: General - Other >> Aug 21, 2023  2:26 PM Emylou G wrote:  Reason for CRM: Polly Brink w/[facility] Needs Verbal Orders: PT 2w1 and 1w7 can call and leave message secured line [phone]

## 2023-08-30 ENCOUNTER — Ambulatory Visit: Payer: Medicaid Other | Admitting: Internal Medicine

## 2023-09-11 ENCOUNTER — Ambulatory Visit: Payer: Medicaid Other | Admitting: Internal Medicine

## 2023-09-11 ENCOUNTER — Other Ambulatory Visit: Payer: Self-pay | Admitting: Family Medicine

## 2023-09-11 DIAGNOSIS — M4644 Discitis, unspecified, thoracic region: Secondary | ICD-10-CM

## 2023-09-11 NOTE — Telephone Encounter (Unsigned)
 Copied from CRM [id number]. Topic: Clinical - Medication Refill >> Sep 11, 2023 12:23 PM Ameerah G wrote: Medication: Potassium Chloride  ER 20 MEQ TBCR,pregabalin  (LYRICA ) 75 MG capsule,terbinafine  (LAMISIL ) 250 MG tablet   Has the patient contacted their pharmacy? Yes (Agent: If no, request that the patient contact the pharmacy for the refill. If patient does not wish to contact the pharmacy document the reason why and proceed with request.) (Agent: If yes, when and what did the pharmacy advise?)  This is the patient's preferred pharmacy:  [facility] [address], [address] [place] [address] Phone: [phone] Fax: [phone]   Is this the correct pharmacy for this prescription? Yes If no, delete pharmacy and type the correct one.   Has the prescription been filled recently? No  Is the patient out of the medication? No almost out  Has the patient been seen for an appointment in the last year OR does the patient have an upcoming appointment? Yes  Can we respond through MyChart? Yes  Agent: Please be advised that Rx refills may take up to 3 business days. We ask that you follow-up with your pharmacy.

## 2023-09-12 ENCOUNTER — Other Ambulatory Visit: Payer: Self-pay | Admitting: Family Medicine

## 2023-09-12 ENCOUNTER — Other Ambulatory Visit (HOSPITAL_COMMUNITY): Payer: Self-pay

## 2023-09-12 DIAGNOSIS — M4644 Discitis, unspecified, thoracic region: Secondary | ICD-10-CM

## 2023-09-12 MED ORDER — TERBINAFINE HCL 250 MG PO TABS
250.0000 mg | ORAL_TABLET | Freq: Every day | ORAL | 2 refills | Status: DC
Start: 2023-09-12 — End: 2023-12-17
  Filled 2023-09-12: qty 30, 30d supply, fill #0
  Filled 2023-10-01 – 2023-10-04 (×2): qty 30, 30d supply, fill #1
  Filled 2023-11-07: qty 30, 30d supply, fill #2

## 2023-09-12 MED ORDER — PREGABALIN 75 MG PO CAPS
75.0000 mg | ORAL_CAPSULE | Freq: Two times a day (BID) | ORAL | 2 refills | Status: DC
  Filled 2023-09-12: qty 60, 30d supply, fill #0
  Filled 2023-10-10 – 2023-10-11 (×2): qty 60, 30d supply, fill #1
  Filled 2023-11-07 – 2023-11-30 (×2): qty 60, 30d supply, fill #2

## 2023-09-12 MED ORDER — POTASSIUM CHLORIDE ER 20 MEQ PO TBCR
20.0000 meq | EXTENDED_RELEASE_TABLET | Freq: Every day | ORAL | 1 refills | Status: DC
  Filled 2023-09-12: qty 60, 60d supply, fill #0
  Filled 2023-09-23 – 2023-11-07 (×2): qty 60, 60d supply, fill #1

## 2023-09-12 MED ORDER — PREGABALIN 75 MG PO CAPS: 75.0000 mg | ORAL_CAPSULE | Freq: Two times a day (BID) | ORAL | 0 refills | Status: DC

## 2023-09-12 NOTE — Telephone Encounter (Signed)
 Requested medication (s) are due for refill today: review  Requested medication (s) are on the active medication list: yes  Last refill:  see med list, terbinafine  and potassium are historical  Future visit scheduled: yes  Notes to clinic:  pregabalin  not delegated, Historical provider/medication, please review for refill      Requested Prescriptions  Pending Prescriptions Disp Refills   Potassium Chloride  ER 20 MEQ TBCR 60 tablet     Sig: Take 1 tablet (20 mEq total) by mouth daily.     Endocrinology:  Minerals - Potassium Supplementation Failed - 09/12/2023 12:31 PM      Failed - Cr in normal range and within 360 days    Creat  Date Value Ref Range Status  12/12/2022 1.26 0.70 - 1.35 mg/dL Final   Creatinine, Ser  Date Value Ref Range Status  07/21/2023 1.51 (H) 0.61 - 1.24 mg/dL Final   Creatinine, Urine  Date Value Ref Range Status  08/12/2022 35 mg/dL Final    Comment:    Performed at [facility], [address], [place], [place] [address]         Passed - K in normal range and within 360 days    Potassium  Date Value Ref Range Status  07/21/2023 4.0 3.5 - 5.1 mmol/L Final         Passed - Valid encounter within last 12 months    Recent Outpatient Visits           2 months ago Type 2 diabetes mellitus with other specified complication, with long-term current use of insulin  [facility])   [place] [facility] Dept Of [place]. [facility], Shelvy Dickens M, [place]   6 months ago Type 2 diabetes mellitus with other specified complication, with long-term current use of insulin  [facility])   [place] [facility] - A Dept Of [place]. [facility] [name], MD   8 months ago HFimpEF (heart failure with improved ejection fraction) [facility])   [place] [facility] - A Dept Of [place]. [facility] [name], MD   10 months ago Hospital discharge follow-up   [facility] - A Dept Of  [place]. [facility], Drexel Gentles, FNP   1 year ago Type 2 diabetes mellitus with hyperglycemia, with long-term current use of insulin  [facility])   [place] [facility] - A Dept Of . [facility], Stan Eans, PA-C       Future Appointments             In 1 week Valentina Gasman, Karon Packer., NP [facility] at [facility] A Dept of The [place]. [facility], H&V             pregabalin  (LYRICA ) 75 MG capsule 30 capsule 0    Sig: Take 1 capsule (75 mg total) by mouth 2 (two) times daily.     Not Delegated - Neurology:  Anticonvulsants - Controlled - pregabalin  Failed - 09/12/2023 12:31 PM      Failed - This refill cannot be delegated      Failed - Cr in normal range and within 360 days    Creat  Date Value Ref Range Status  12/12/2022 1.26 0.70 - 1.35 mg/dL Final   Creatinine, Ser  Date Value Ref Range Status  07/21/2023 1.51 (H) 0.61 - 1.24 mg/dL Final   Creatinine, Urine  Date  Value Ref Range Status  08/12/2022 35 mg/dL Final    Comment:    Performed at [facility], [address] [place], [place] [address]         Passed - Completed PHQ-2 or PHQ-9 in the last 360 days      Passed - Valid encounter within last 12 months    Recent Outpatient Visits           2 months ago Type 2 diabetes mellitus with other specified complication, with long-term current use of insulin  [facility])   [place] [facility] - A Dept Of [place]. [facility], Shelvy Dickens M, [place]   6 months ago Type 2 diabetes mellitus with other specified complication, with long-term current use of insulin  [facility])   [place] [facility] - A Dept Of [place]. [facility] [name], MD   8 months ago HFimpEF (heart failure with improved ejection fraction) [facility])   [place] [facility] - A Dept Of [place]. [facility] [name], MD   10 months ago Hospital discharge follow-up   [facility]  [facility] - A Dept Of [place]. [facility], Drexel Gentles, FNP   1 year ago Type 2 diabetes mellitus with hyperglycemia, with long-term current use of insulin  [facility])   [place] [facility] - A Dept Of [place]. [facility], Stan Eans, PA-C       Future Appointments             In 1 week Valentina Gasman, Karon Packer., NP [facility] HeartCare at [facility] A Dept of The [place]. [facility], H&V             terbinafine  (LAMISIL ) 250 MG tablet      Sig: Take 1 tablet (250 mg total) by mouth daily.     Off-Protocol Failed - 09/12/2023 12:31 PM      Failed - Medication not assigned to a protocol, review manually.      Passed - Valid encounter within last 12 months    Recent Outpatient Visits           2 months ago Type 2 diabetes mellitus with other specified complication, with long-term current use of insulin  (HCC)   [place] [facility] - A Dept Of [place]. [facility], Shelvy Dickens M, [place]   6 months ago Type 2 diabetes mellitus with other specified complication, with long-term current use of insulin  [facility])   [place] [facility] - A Dept Of [place]. [facility] [name], MD   8 months ago HFimpEF (heart failure with improved ejection fraction) [facility]   [place] [facility] - A Dept Of [place]. [facility] [name], MD   10 months ago Hospital discharge follow-up   [facility] - A Dept Of [place]. [facility] [place], Drexel Gentles, FNP   1 year ago Type 2 diabetes mellitus with hyperglycemia, with long-term current use of insulin  [facility])    Comm [facility] - A Dept Of [place]. [facility] [place], Stan Eans, PA-C       Future Appointments             In 1 week Valentina Gasman, Karon Packer., NP [facility] at [facility] A Dept of [place]. [facility], H&V  Signed Prescriptions Disp Refills   terbinafine  (LAMISIL ) 250 MG  tablet      Sig: Take 250 mg by mouth daily.     There is no refill protocol information for this order

## 2023-09-13 ENCOUNTER — Ambulatory Visit: Admitting: Nurse Practitioner

## 2023-09-18 ENCOUNTER — Ambulatory Visit: Admitting: Physician Assistant

## 2023-09-24 NOTE — Progress Notes (Signed)
 Cardiology Office Note    Patient Name: David [name] Date of Encounter: 09/25/2023  Primary Care Provider:  Delbert Clam, MD Primary Cardiologist:  Maude Emmer, MD Primary Electrophysiologist: None   Past Medical History    Past Medical History:  Diagnosis Date   Cardiomyopathy [facility])    a. EF 45% in 2019.   CHF (congestive heart failure) (HCC)    Chronic anticoagulation 09/30/2017   Diabetes mellitus type 2 in nonobese [facility])    Discitis of thoracic region 09/25/2022   Does not have health insurance    Essential hypertension 09/26/2017   Financial difficulties    H/O noncompliance with medical treatment, presenting hazards to health    Hardware complicating wound infection (HCC) 09/25/2022   Non-insulin  treated type 2 diabetes mellitus (HCC) 09/26/2017   Persistent atrial fibrillation (HCC)    Sleep apnea suspected 09/30/2017   years ago- not anymore per patient    History of Present Illness  David [name] is a 63 y.o. male with a PMH of CAD s/p CABG x 4 with LIMA to LAD, GV to PDA, VG to OM1 to OM3, PV ablation and LAA ligation on 04/2023 HFimEF, HTN, persistent AF, DM type II who presents today for 24-month follow-up.  [name] has been followed by Dr. Emmer at his rate of AF and CAD since 2019.  He was diagnosed with AF with RVR in 09/2017 and he underwent TEE/DCCV showing EF 45-50% with diffuse HK, mild MR, severely dilated LA -> unsuccessful restoration of NSR.  He was placed on sotalol  with plans for repeat cardioversion but never returned for the procedure and stopped his medications.  He was seen in 06/2021 by [name], PA plaint of fatigue and lethargy.  He was continued on GDMT with recommendations of outpatient Myoview  and echo but was admitted for MRSA bacteremia.  He underwent a toe amputation by [name] he was referred to EP for AF ablation versus repeat cardioversion.  He was seen in the ED on 04/2023 progressive shortness of breath and was found to have  elevated troponins.  He was also found to have elevated proBNP and 2D echo was completed showing EF of 65 to 70%.  Repeat LHC that showed three-vessel CAD was evaluated by Dr. Lucas and subsequent CABG x 4 with LAA ligation and PVI ablation.  He was seen in follow-up on 06/16/2023.  He reported no shortness of breath or chest pain.  He was seen in the ED on 07/12/2023 after a mechanical fall and suffering a left hip fracture.  Left total hip arthroplasty on 07/14/2023.  He was placed in SNF following hospitalization.  He was continued on current regimen with carvedilol  and Eliquis .  [name] presents today for 7-month follow-up.  He reports no chest pain or angina since his follow-up visit. His heart is functioning well post-bypass surgery. He is on carvedilol  6.25 mg, Eliquis , Crestor , and potassium chloride  with Lasix , all of which he tolerates well. He has a history of atrial fibrillation, which resolved after surgery but later returned. He is not currently on amiodarone , having taken it for only ten days post-surgery. No current issues with palpitations or symptoms of atrial fibrillation. He underwent spinal fusion surgery a year ago due to a bursa infection that was eroding his vertebrae. He has four rods and sixteen screws in his back and experiences persistent back pain, especially after physical activities such as working out or lifting groceries. He manages the pain with Aleve as needed  and takes extended-release Oxycontin  every twelve hours, which he finds effective without impairing his ability to drive or operate machinery. He is concerned about his blood sugar levels, which have been difficult to manage due to recent stress from moving and dietary changes. He notes a weight gain to 189 pounds from his usual 175 pounds and is working on establishing a routine to address this. Family history includes atrial fibrillation, as his mother also had atrial fibrillation. No current issues with constipation,  shortness of breath, or palpitations. He feels generally healthy aside from being overweight. Patient denies chest pain, palpitations, dyspnea, PND, orthopnea, nausea, vomiting, dizziness, syncope, edema, weight gain, or early satiety.  Discussed the use of AI scribe software for clinical note transcription with the patient, who gave verbal consent to proceed.  History of Present Illness    Review of Systems  Please see the history of present illness.    All other systems reviewed and are otherwise negative except as noted above.  Physical Exam    Wt Readings from Last 3 Encounters:  09/25/23 189 lb 9.6 oz (86 kg)  07/14/23 (P) 180 lb (81.6 kg)  06/30/23 180 lb (81.6 kg)   VS: Vitals:   09/25/23 1358  BP: 104/70  Pulse: 97  SpO2: 97%  ,Body mass index is 25.71 kg/m. GEN: Well nourished, well developed in no acute distress Neck: No JVD; No carotid bruits Pulmonary: Clear to auscultation without rales, wheezing or rhonchi  Cardiovascular: Normal rate. Regular rhythm. Normal S1. Normal S2.   Murmurs: There is no murmur.  ABDOMEN: Soft, non-tender, non-distended EXTREMITIES:  No edema; No deformity   EKG/LABS/ Recent Cardiac Studies   ECG personally reviewed by me today -none completed today  Risk Assessment/Calculations:    CHA2DS2-VASc Score = 4   This indicates a 4.8% annual risk of stroke. The patient's score is based upon: CHF History: 1 HTN History: 1 Diabetes History: 1 Stroke History: 0 Vascular Disease History: 1 Age Score: 0 Gender Score: 0         Lab Results  Component Value Date   WBC 8.5 07/21/2023   HGB 9.4 (L) 07/21/2023   HCT 27.8 (L) 07/21/2023   MCV 89.7 07/21/2023   PLT 180 07/21/2023   Lab Results  Component Value Date   CREATININE 1.51 (H) 07/21/2023   BUN 25 (H) 07/21/2023   NA 134 (L) 07/21/2023   K 4.0 07/21/2023   CL 102 07/21/2023   CO2 20 (L) 07/21/2023   Lab Results  Component Value Date   CHOL 199 01/09/2023   HDL 36  (L) 01/09/2023   LDLCALC 121 (H) 01/09/2023   TRIG 235 (H) 01/09/2023   CHOLHDL 5.9 (H) 12/14/2021    Lab Results  Component Value Date   HGBA1C 7.4 (H) 04/09/2023   Assessment & Plan    Assessment & Plan  1.  Coronary artery disease: -s/p CABG x 4 04/2023 and ablation show appendage clipped - Today patient reports no chest pain or angina since bypass surgery. - Continue current GDMT with Coreg  6.25 mg twice daily and Crestor  20 mg daily  2.  Persistent AF: -s/p pulmonary vein ablation left atrial appendage occluder - Today patient is in sinus rhythm -He reported monitoring symptoms with event monitor if A-fib recurs with possible referral to A-fib clinic. -Continue Coreg  6.25 mg twice daily - Currently on Eliquis  5 mg twice daily  3.  Essential hypertension: - Patient's blood pressure today was stable at 104/70 - Continue  Coreg  6.25 mg twice daily  4.  HFimEF: - Patient's last 2D echo/TEE was 55 to 60% no LVH - Patient is euvolemic on examination today - Continue Lasix  40 mg twice daily and potassium 20 mEq daily  5.  Hyperlipidemia: - Elevated LDL, Crestor  insufficient, Repatha  not started due to pending labs. Plan to obtain labs for dosing. - Order updated lipid panel. - Consider starting Repatha  based on updated lab results. - Continue Crestor  until further evaluation with Pharm.D.  6.Overweight: Weight gain to 189 lbs, motivated for weight loss through activity and diet changes. - Encourage weight loss through increased physical activity and dietary modifications. - He was recently referred to rehab at [facility].  Disposition: Follow-up with Maude Emmer, MD or APP in 6 months    Signed, Wyn Raddle, Jackee Shove, NP 09/25/2023, 2:25 PM [facility]

## 2023-09-25 ENCOUNTER — Ambulatory Visit: Attending: Nurse Practitioner | Admitting: Nurse Practitioner

## 2023-09-25 ENCOUNTER — Encounter: Payer: Self-pay | Admitting: Nurse Practitioner

## 2023-09-25 ENCOUNTER — Other Ambulatory Visit: Payer: Self-pay

## 2023-09-25 VITALS — BP 104/70 | HR 97 | Ht 72.0 in | Wt 189.6 lb

## 2023-09-25 DIAGNOSIS — I2510 Atherosclerotic heart disease of native coronary artery without angina pectoris: Secondary | ICD-10-CM | POA: Insufficient documentation

## 2023-09-25 DIAGNOSIS — E7800 Pure hypercholesterolemia, unspecified: Secondary | ICD-10-CM | POA: Diagnosis present

## 2023-09-25 DIAGNOSIS — I5020 Unspecified systolic (congestive) heart failure: Secondary | ICD-10-CM

## 2023-09-25 DIAGNOSIS — E663 Overweight: Secondary | ICD-10-CM | POA: Diagnosis present

## 2023-09-25 DIAGNOSIS — I4819 Other persistent atrial fibrillation: Secondary | ICD-10-CM | POA: Diagnosis not present

## 2023-09-25 DIAGNOSIS — I5022 Chronic systolic (congestive) heart failure: Secondary | ICD-10-CM | POA: Insufficient documentation

## 2023-09-25 DIAGNOSIS — I10 Essential (primary) hypertension: Secondary | ICD-10-CM | POA: Insufficient documentation

## 2023-09-25 NOTE — Patient Instructions (Signed)
 Medication Instructions:  Your physician recommends that you continue on your current medications as directed. Please refer to the Current Medication list given to you today.  *If you need a refill on your cardiac medications before your next appointment, please call your pharmacy*  Lab Work: At your convenience fasting labs LFTs and Lipids.   If you have labs (blood work) drawn today and your tests are completely normal, you will receive your results only by: MyChart Message (if you have MyChart) OR A paper copy in the mail If you have any lab test that is abnormal or we need to change your treatment, we will call you to review the results.  Testing/Procedures: None Ordered.   Follow-Up: At [facility], you and your health needs are our priority.  As part of our continuing mission to provide you with exceptional heart care, our providers are all part of one team.  This team includes your primary Cardiologist (physician) and Advanced Practice Providers or APPs (Physician Assistants and Nurse Practitioners) who all work together to provide you with the care you need, when you need it.  Your next appointment:   6 month(s)  Provider:   [name], PA-C    We recommend signing up for the patient portal called MyChart.  Sign up information is provided on this After Visit Summary.  MyChart is used to connect with patients for Virtual Visits (Telemedicine).  Patients are able to view lab/test results, encounter notes, upcoming appointments, etc.  Non-urgent messages can be sent to your provider as well.   To learn more about what you can do with MyChart, go to [email or web address].   Other Instructions Follow-up with pharmacist.

## 2023-09-28 ENCOUNTER — Other Ambulatory Visit: Payer: Self-pay

## 2023-09-28 ENCOUNTER — Telehealth: Payer: Self-pay | Admitting: Family Medicine

## 2023-09-28 ENCOUNTER — Ambulatory Visit: Attending: Student

## 2023-09-28 DIAGNOSIS — R293 Abnormal posture: Secondary | ICD-10-CM | POA: Diagnosis present

## 2023-09-28 DIAGNOSIS — R2689 Other abnormalities of gait and mobility: Secondary | ICD-10-CM | POA: Diagnosis present

## 2023-09-28 DIAGNOSIS — R262 Difficulty in walking, not elsewhere classified: Secondary | ICD-10-CM | POA: Diagnosis present

## 2023-09-28 DIAGNOSIS — R252 Cramp and spasm: Secondary | ICD-10-CM | POA: Diagnosis present

## 2023-09-28 DIAGNOSIS — M25552 Pain in left hip: Secondary | ICD-10-CM | POA: Insufficient documentation

## 2023-09-28 DIAGNOSIS — M25652 Stiffness of left hip, not elsewhere classified: Secondary | ICD-10-CM | POA: Diagnosis present

## 2023-09-28 DIAGNOSIS — R2681 Unsteadiness on feet: Secondary | ICD-10-CM | POA: Diagnosis present

## 2023-09-28 DIAGNOSIS — M6281 Muscle weakness (generalized): Secondary | ICD-10-CM | POA: Diagnosis present

## 2023-09-28 NOTE — Therapy (Signed)
 OUTPATIENT PHYSICAL THERAPY LOWER EXTREMITY EVALUATION   Patient Name: David [name] MRN: [id number] DOB:04-Oct-1960, 63 y.o., male Today's Date: 09/28/2023  END OF SESSION:  PT End of Session - 09/28/23 1023     Visit Number 1    Date for PT Re-Evaluation 11/23/23    Authorization Type Medicaid    PT Start Time 1020    Activity Tolerance Patient tolerated treatment well;Patient limited by pain    Behavior During Therapy [facility] for tasks assessed/performed          Past Medical History:  Diagnosis Date   Cardiomyopathy (HCC)    a. EF 45% in 2019.   CHF (congestive heart failure) (HCC)    Chronic anticoagulation 09/30/2017   Diabetes mellitus type 2 in nonobese [facility])    Discitis of thoracic region 09/25/2022   Does not have health insurance    Essential hypertension 09/26/2017   Financial difficulties    H/O noncompliance with medical treatment, presenting hazards to health    Hardware complicating wound infection (HCC) 09/25/2022   Non-insulin  treated type 2 diabetes mellitus (HCC) 09/26/2017   Persistent atrial fibrillation (HCC)    Sleep apnea suspected 09/30/2017   years ago- not anymore per patient   Past Surgical History:  Procedure Laterality Date   ABDOMINAL AORTOGRAM W/LOWER EXTREMITY N/A 09/02/2021   Procedure: ABDOMINAL AORTOGRAM W/LOWER EXTREMITY;  Surgeon: Serene Gaile ORN, MD;  [facility]: [facility];  Service: Cardiovascular;  Laterality: N/A;   ABDOMINAL AORTOGRAM W/LOWER EXTREMITY N/A 12/28/2021   Procedure: ABDOMINAL AORTOGRAM W/LOWER EXTREMITY;  Surgeon: Serene Gaile ORN, MD;  [facility]: [facility];  Service: Cardiovascular;  Laterality: N/A;   ABDOMINAL AORTOGRAM W/LOWER EXTREMITY N/A 07/28/2022   Procedure: ABDOMINAL AORTOGRAM W/LOWER EXTREMITY;  Surgeon: Gretta Lonni PARAS, MD;  [facility];  Service: Cardiovascular;  Laterality: N/A;   AMPUTATION Right 09/04/2021   Procedure: 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;  Surgeon: Harden Jerona GAILS, MD;  Location: [facility] OR;  Service: Orthopedics;  Laterality: Right;   AMPUTATION Right 08/02/2022   Procedure: AMPUTATION RAY 5TH METATARSAL OF RIGHT FOOT;  Surgeon: Barton Drape, MD;  Location: [facility] OR;  Service: Orthopedics;  Laterality: Right;   APPENDECTOMY  1971   BONE BIOPSY Right 08/02/2022   Procedure: BONE BIOPSY OF 5TH METATARSAL RIGHT FOOT;  Surgeon: Barton Drape, MD;  [facility]: [facility];  Service: Orthopedics;  Laterality: Right;   CARDIOVERSION N/A 09/28/2017   Procedure: CARDIOVERSION;  Surgeon: Francyne Headland, MD;  [facility]OS[facility];  Service: Cardiovascular;  Laterality: N/A;   CLIPPING OF ATRIAL APPENDAGE N/A 04/18/2023   Procedure: CLIPPING OF ATRIAL APPENDAGE USING ATRICURE ATRICLIP [id number] with pulmonary vein isolation ablation;  Surgeon: Lucas Dorise POUR, MD;  Location: [facility] OR;  Service: Open Heart Surgery;  Laterality: N/A;   CORONARY ARTERY BYPASS GRAFT N/A 04/18/2023   Procedure: CORONARY ARTERY BYPASS GRAFTING (CABG) TIMES FOUR USING LEFT INTERNAL MAMMARY ARTERY AND ENDOSCOPICALLY HARVESTED LEFT GREATER SAPHENOUS VEIN;  Surgeon: Lucas Dorise POUR, MD;  [facility]: [facility] OR;  Service: Open Heart Surgery;  Laterality: N/A;   IRRIGATION AND DEBRIDEMENT FOOT  08/02/2022   Procedure: IRRIGATION AND DEBRIDEMENT RIGHT 5TH METATARSAL FOOT;  Surgeon: Barton Drape, MD;  [facility];  Service: Orthopedics;;   LAMINECTOMY WITH POSTERIOR LATERAL ARTHRODESIS LEVEL 4 N/A 08/30/2022   Procedure: THORACIC FIVE-THORACIC ELEVEN FUSION, THORACIC EIGHT TRANSPEDICULAR DECOMPRESSION AND PARTIAL CORPECTOMY with O-Arm;  Surgeon: Debby Dorn MATSU, MD;  Location: [facility] OR;  Service: Neurosurgery;  Laterality:  N/A;   LEFT HEART CATH AND CORONARY ANGIOGRAPHY N/A 04/12/2023   Procedure: LEFT HEART CATH AND CORONARY ANGIOGRAPHY;  Surgeon: Verlin Lonni BIRCH, MD;  [facility];  Service: Cardiovascular;  Laterality: N/A;   NASAL ENDOSCOPY Right 06/30/2023   Procedure:  ENDOSCOPY, NOSE;  Surgeon: Jesus Oliphant, MD;  Location: [facility] OR;  Service: ENT;  Laterality: Right;   PERIPHERAL VASCULAR BALLOON ANGIOPLASTY  09/02/2021   Procedure: PERIPHERAL VASCULAR BALLOON ANGIOPLASTY;  Surgeon: Serene Gaile ORN, MD;  [facility];  Service: Cardiovascular;;   PERIPHERAL VASCULAR BALLOON ANGIOPLASTY  12/28/2021   Procedure: PERIPHERAL VASCULAR BALLOON ANGIOPLASTY;  Surgeon: Serene Gaile ORN, MD;  [facility];  Service: Cardiovascular;;  Posterior Tibial PTA only   POLYPECTOMY Right 06/30/2023   Procedure: POLYPECTOMY, NASAL CAVITY;  Surgeon: Jesus Oliphant, MD;  Location: [facility] OR;  Service: ENT;  Laterality: Right;   TEE WITHOUT CARDIOVERSION N/A 09/28/2017   Procedure: TRANSESOPHAGEAL ECHOCARDIOGRAM (TEE);  Surgeon: Francyne Headland, MD;  Location: [facility] ENDOSCOPY;  Service: Cardiovascular;  Laterality: N/A;   TEE WITHOUT CARDIOVERSION N/A 09/03/2021   Procedure: TRANSESOPHAGEAL ECHOCARDIOGRAM (TEE);  Surgeon: Raford Riggs, MD;  Location: [facility] ENDOSCOPY;  Service: Cardiovascular;  Laterality: N/A;   TEE WITHOUT CARDIOVERSION N/A 07/15/2022   Procedure: TRANSESOPHAGEAL ECHOCARDIOGRAM;  Surgeon: Francyne Headland, MD;  [facility];  Service: Cardiovascular;  Laterality: N/A;   TEE WITHOUT CARDIOVERSION N/A 04/18/2023   Procedure: TRANSESOPHAGEAL ECHOCARDIOGRAM (TEE);  Surgeon: Lucas Dorise POUR, MD;  Location: [facility] OR;  Service: Open Heart Surgery;  Laterality: N/A;   TOTAL HIP ARTHROPLASTY Left 07/14/2023   Procedure: ARTHROPLASTY, HIP, TOTAL, ANTERIOR APPROACH;  Surgeon: Kendal Franky SQUIBB, MD;  Location: [facility];  Service: Orthopedics;  Laterality: Left;   Patient Active Problem List   Diagnosis Date Noted   Left displaced femoral neck fracture (HCC) 07/13/2023   Closed left femoral fracture (HCC) 07/12/2023   History of CHF (congestive heart failure) 07/12/2023   Chronic kidney disease (CKD), stage III (moderate) (HCC) 07/12/2023    Hyperlipidemia 07/12/2023   BPH (benign prostatic hyperplasia) 07/12/2023   Peripheral neuropathy 07/12/2023   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   Bradycardia 04/14/2023   NSTEMI (non-ST elevated myocardial infarction) (HCC) 04/13/2023   Chronic bilateral low back pain 04/13/2023   History of CABG in  04/2023 04/12/2023   Acute decompensated heart failure (HCC) 04/10/2023   Chronic pain syndrome 04/09/2023   MRSA infection greater than 3 months ago 04/09/2023   Hypoglycemia 10/06/2022   Pleural effusion 09/29/2022   Chronic systolic CHF (congestive heart failure) (HCC) 09/29/2022   Acute urinary retention 09/29/2022   Anxiety 09/29/2022   Hardware complicating wound infection (HCC) 09/25/2022   Discitis of thoracic region 09/25/2022   Hypokalemia 09/07/2022   Acute toxic encephalopathy 09/07/2022   Acute postoperative anemia due to expected blood loss 09/07/2022   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   Adjustment disorder with mixed anxiety and depressed mood 08/13/2022   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 08/08/2022   MRSA bacteremia 08/08/2022   AKI (acute kidney injury), ruled out 07/27/2022   Discitis thoracic region 07/27/2022   Bacteremia due to methicillin resistant Staphylococcus aureus 07/27/2022   Severe protein-calorie malnutrition (HCC) 07/26/2022   PVD (peripheral vascular disease) (HCC) 07/26/2022   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   Bacteremia 07/15/2022   Atelectasis 07/12/2022   Sepsis due to pneumonia (HCC) 07/12/2022   Acute on chronic diastolic (congestive) heart failure (HCC) 07/12/2022   Gangrene of  toe of right foot (HCC)    Sepsis (HCC) 08/29/2021   SIRS (systemic inflammatory response syndrome) (HCC) 08/29/2021   Hyponatremia 08/29/2021   Hypomagnesemia 08/29/2021   Pure hypercholesterolemia 06/17/2021   Pain of left hand 09/06/2019   Cellulitis 08/26/2019   Persistent atrial fibrillation (HCC) 08/26/2019   HFimpEF (heart failure with improved  ejection fraction) (HCC) 10/18/2018   Transaminitis 10/17/2018   Hyperbilirubinemia 10/17/2018   Leukocytosis 10/17/2018   Sinusitis 10/17/2018   Chronic anticoagulation 09/30/2017   Smoker 09/30/2017   Cardiomyopathy (HCC) 09/30/2017   Sleep apnea suspected 09/30/2017   Essential hypertension 09/26/2017   Insulin  dependent type 2 diabetes mellitus (HCC) 09/26/2017   Permanent atrial fibrillation (HCC) 09/26/2017    PCP: Delbert Clam, MD  REFERRING PROVIDER: Kendal Franky SQUIBB, MD  REFERRING DIAG: [phone] (ICD-10-CM) - Status post total hip replacement, left  THERAPY DIAG:  Pain in left hip - Plan: PT plan of care cert/re-cert  Stiffness of left hip, not elsewhere classified - Plan: PT plan of care cert/re-cert  Muscle weakness (generalized) - Plan: PT plan of care cert/re-cert  Cramp and spasm - Plan: PT plan of care cert/re-cert  Difficulty in walking, not elsewhere classified - Plan: PT plan of care cert/re-cert  Abnormal posture - Plan: PT plan of care cert/re-cert  Unsteadiness on feet - Plan: PT plan of care cert/re-cert  Rationale for Evaluation and Treatment: Rehabilitation  ONSET DATE: 09/26/2023  SUBJECTIVE:   SUBJECTIVE STATEMENT: Patient had series of orthopedic issues.  He was in PT for back pain when he was found to have MRSA infection in spine May 2024, surgery followed for fusion/reconstruction, January had CABG,  was still on cane once he got home and fell in bathroom and suffered hip fracture on left with subsequent THA.  His THA was on 07/14/23.  He was in rehab for several weeks and is now home.  He felt he did not get aggressive enough PT in rehab.  He then had home health and only did this 1 time per week.  He did his exercises and is very motivated to get his function back.  He is unsure if he will return to work.  He is concerned about the physicality of the job with all that he has endured.  He states this all depends on how much function he gets  back.  His goal is to be able to walk without the cane and build some upper body strength to be able to function at the level he did before and do his job.   PERTINENT HISTORY: See above PAIN:  Are you having pain? Yes: NPRS scale: 3-4/10,  worst in past two weeks 8/10 Pain location: left hip Pain description: aching / stiff Aggravating factors: sitting Relieving factors: moving  PRECAUTIONS: Fall  RED FLAGS: None   WEIGHT BEARING RESTRICTIONS: No  FALLS:  Has patient fallen in last 6 months? Yes. Number of falls 1  LIVING ENVIRONMENT: Lives with: lives with their spouse Lives in: House/apartment Stairs: No Has following equipment at home: Single point cane  OCCUPATION: chef  PLOF: Independent, Independent with basic ADLs, Independent with household mobility without device, Independent with community mobility without device, Independent with homemaking with ambulation, Independent with gait, and Independent with transfers  PATIENT GOALS: To be able to walk without the cane and build some upper body strength to be able to function at the level he did before and do his job.    NEXT MD VISIT: 2 months  OBJECTIVE:  Note: Objective measures were completed at Evaluation unless otherwise noted.  DIAGNOSTIC FINDINGS: na  PATIENT SURVEYS:  LEFS : 30/80=  COGNITION: Overall cognitive status: Within functional limits for tasks assessed     SENSATION: WFL   MUSCLE LENGTH: Hips tight bilaterally in hip flexors, quads and rotation both IR and ER   Left > Right  POSTURE: rounded shoulders, forward head, decreased lumbar lordosis, posterior pelvic tilt, and flexed trunk    LOWER EXTREMITY ROM:  WFL  LOWER EXTREMITY MMT:  MMT Right eval Left eval  Hip flexion  4  Hip extension  4-  Hip abduction  4  Hip adduction  4  Hip internal rotation  4  Hip external rotation  4  Knee flexion  4  Knee extension  4+  Ankle dorsiflexion    Ankle plantarflexion    Ankle  inversion    Ankle eversion     (Blank rows = not tested)   FUNCTIONAL TESTS:  5 times sit to stand: 15.14 sec (no UE use) Timed up and go (TUG): 14.00 sec  GAIT: Distance walked: 50 feet Assistive device utilized: Single point cane Level of assistance: SBA Comments: antalgic/guarded/ slow                                                                                                                                TREATMENT DATE: 09/28/23 Initial eval completed and initiated HEP     PATIENT EDUCATION:  Education details: Initiated HEP Person educated: Patient Education method: Programmer, multimedia, Facilities manager, Verbal cues, and Handouts Education comprehension: verbalized understanding and returned demonstration  HOME EXERCISE PROGRAM: Access Code: [email or web address] URL: [email or web address] Date: 09/28/2023 Prepared by: Delon Haddock  Exercises - Squat with Chair Touch  - 1 x daily - 7 x weekly - 4 sets - 5 reps - Side Stepping with Resistance at Ankles  - 1 x daily - 7 x weekly - 3 sets - 10 reps - Seated Hip Abduction with Resistance  - 1 x daily - 7 x weekly - 1 sets - 20 reps  ASSESSMENT:  CLINICAL IMPRESSION: Patient is a 63 y.o. male who was seen today for physical therapy evaluation and treatment for s/p left THA.  He has a very recent hx of several serious medical conditions that have resulted in multiple hospitalizations and rehab.  He was in PT for a back issue and was found to have MRSA, was in hospital for an extended amount of time, then had CABG, then fell in bathroom fracturing the left hip which is what he is here for.  He is doing well now from a medical standpoint but he is extremely weak and has lost a lot of function due to the multiple hospitalizations and procedures.  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  He played drums, played tennis, enjoyed dancing.  He is dealing with a lot of emotional  issues with not being able to  do these activities because it helped him stay fit.  He presents with abnormal gait, severe core and proximal hip weakness but has done well gaining distal LE strength through his rehab at the hospital and in home health.  However, he states that this is likely due to his determination to get stronger because he felt the rehab and home health were very limited in what they could do.  He seems well motivated and compliant.  He would benefit from skilled PT for total body strengthening and focused hip and core strengthening to insure safe return to routine daily function and eventually to work if he is able.    OBJECTIVE IMPAIRMENTS: Abnormal gait, cardiopulmonary status limiting activity, decreased activity tolerance, decreased balance, decreased endurance, difficulty walking, decreased ROM, decreased strength, increased fascial restrictions, increased muscle spasms, impaired flexibility, impaired UE functional use, improper body mechanics, postural dysfunction, and pain.   ACTIVITY LIMITATIONS: carrying, lifting, bending, sitting, standing, squatting, sleeping, stairs, transfers, bed mobility, bathing, toileting, dressing, reach over head, hygiene/grooming, and caring for others  PARTICIPATION LIMITATIONS: meal prep, cleaning, laundry, driving, shopping, community activity, occupation, yard work, and church  PERSONAL FACTORS: Fitness, Past/current experiences, Profession, Time since onset of injury/illness/exacerbation, and 3+ comorbidities: spine surgery, CABG, diabetes and Htn are also affecting patient's functional outcome.   REHAB POTENTIAL: Good  CLINICAL DECISION MAKING: Unstable/unpredictable  EVALUATION COMPLEXITY: High   GOALS: Goals reviewed with patient? Yes  SHORT TERM GOALS: Target date: 10/26/2023  Pain report to be no greater than 4/10  Baseline: Goal status: INITIAL  2.  Patient will be independent with initial HEP  Baseline:  Goal status: INITIAL  3.  Patient to be able  to ambulate 50 feet without a.d Baseline:  Goal status: INITIAL  4.  Patient to be able to do 10 sit to stand without UE support  Baseline:  Goal status: INITIAL    LONG TERM GOALS: Target date: 11/23/2023  Patient to report pain no greater than 2/10  Baseline:  Goal status: INITIAL  2.  Patient to be independent with advanced HEP  Baseline:  Goal status: INITIAL  3.  Patient to be able to stand or walk for at least 15 min without pain or need to rest Baseline:  Goal status: INITIAL  4.  Patient to be able to ascend and descend 12 steps safely Baseline:  Goal status: INITIAL  5.  Patient to report 85% improvement in overall symptoms and function Baseline:  Goal status: INITIAL  6.  Patient to be able to walk 500 feet without a.d. safely without rest breaks Baseline:  Goal status: INITIAL   PLAN:  PT FREQUENCY: 3x/week  PT DURATION: 8 weeks  PLANNED INTERVENTIONS: 97110-Therapeutic exercises, 97530- Therapeutic activity, [id number]- Neuromuscular re-education, 97535- Self Care, [id number]- Manual therapy, [phone]- Gait training, [phone]- Aquatic Therapy, [phone]- Electrical stimulation (unattended), [phone]- Electrical stimulation (manual), [id number]- Vasopneumatic device, 97035- Ultrasound, [id number]- Ionotophoresis 4mg /ml Dexamethasone , Patient/Family education, Balance training, Stair training, Taping, Joint mobilization, Spinal mobilization, Cryotherapy, and Moist heat  PLAN FOR NEXT SESSION: Nustep, begin functional strength training; patient has been in rehab and home health: he needs higher level functional training.   Delon B. Lebert Lovern, PT 09/28/23 11:35 AM [facility] [address] [place], [place] [address] Phone # [phone] Fax [phone]

## 2023-09-28 NOTE — Telephone Encounter (Signed)
Form has been received and faxed.

## 2023-09-28 NOTE — Telephone Encounter (Signed)
 Copied from CRM [id number]. Topic: Clinical - Home Health Verbal Orders >> Sep 28, 2023  2:58 PM Unterreiner HERO wrote:  Caller/Agency: Donny Rushing Number: [phone] Service Requested:  N/A Frequency: N/A Any new concerns about the patient? No.  Home health plan of care was approved, that goes back to march. Calling to follow up on a fax that was never responded to, 2 days ago, faxing it over again now, would like a call back asap to know its received. Calling to check due to the time that's passed.

## 2023-09-29 ENCOUNTER — Other Ambulatory Visit (HOSPITAL_COMMUNITY): Payer: Self-pay

## 2023-09-29 ENCOUNTER — Encounter: Payer: Self-pay | Admitting: Physical Therapy

## 2023-09-29 ENCOUNTER — Other Ambulatory Visit: Payer: Self-pay | Admitting: Family Medicine

## 2023-09-29 ENCOUNTER — Ambulatory Visit: Admitting: Physical Therapy

## 2023-09-29 DIAGNOSIS — R2681 Unsteadiness on feet: Secondary | ICD-10-CM

## 2023-09-29 DIAGNOSIS — M25552 Pain in left hip: Secondary | ICD-10-CM | POA: Diagnosis not present

## 2023-09-29 DIAGNOSIS — M25652 Stiffness of left hip, not elsewhere classified: Secondary | ICD-10-CM

## 2023-09-29 DIAGNOSIS — R293 Abnormal posture: Secondary | ICD-10-CM

## 2023-09-29 DIAGNOSIS — R2689 Other abnormalities of gait and mobility: Secondary | ICD-10-CM

## 2023-09-29 DIAGNOSIS — R252 Cramp and spasm: Secondary | ICD-10-CM

## 2023-09-29 DIAGNOSIS — R262 Difficulty in walking, not elsewhere classified: Secondary | ICD-10-CM

## 2023-09-29 DIAGNOSIS — M6281 Muscle weakness (generalized): Secondary | ICD-10-CM

## 2023-09-29 DIAGNOSIS — E119 Type 2 diabetes mellitus without complications: Secondary | ICD-10-CM

## 2023-09-29 LAB — NMR, LIPOPROFILE
Cholesterol, Total: 198 mg/dL (ref 100–199)
HDL Particle Number: 37.5 umol/L (ref >=30.5)
HDL-C: 48 mg/dL (ref >39)
LDL Particle Number: 1133 nmol/L — ABNORMAL HIGH (ref <1000)
LDL Size: 20.4 nm — ABNORMAL LOW (ref >20.5)
LDL-C (NIH Calc): 113 mg/dL — ABNORMAL HIGH (ref 0–99)
LP-IR Score: 62 — ABNORMAL HIGH (ref <=45)
Small LDL Particle Number: 565 nmol/L — ABNORMAL HIGH (ref <=527)
Triglycerides: 212 mg/dL — ABNORMAL HIGH (ref 0–149)

## 2023-09-29 LAB — APOLIPOPROTEIN B: Apolipoprotein B: 97 mg/dL — ABNORMAL HIGH (ref <90)

## 2023-09-29 LAB — LIPOPROTEIN A (LPA): Lipoprotein (a): 9.6 nmol/L (ref <75.0)
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
## 2023-10-01 ENCOUNTER — Other Ambulatory Visit: Payer: Self-pay | Admitting: Physician Assistant

## 2023-10-01 DIAGNOSIS — I5020 Unspecified systolic (congestive) heart failure: Secondary | ICD-10-CM

## 2023-10-02 ENCOUNTER — Other Ambulatory Visit (HOSPITAL_COMMUNITY): Payer: Self-pay

## 2023-10-02 ENCOUNTER — Other Ambulatory Visit: Payer: Self-pay

## 2023-10-02 ENCOUNTER — Other Ambulatory Visit: Payer: Self-pay | Admitting: Family Medicine

## 2023-10-02 ENCOUNTER — Encounter (HOSPITAL_COMMUNITY): Payer: Self-pay

## 2023-10-02 ENCOUNTER — Telehealth: Payer: Self-pay | Admitting: Family Medicine

## 2023-10-02 ENCOUNTER — Ambulatory Visit: Payer: Self-pay | Admitting: Pharmacist

## 2023-10-02 DIAGNOSIS — E119 Type 2 diabetes mellitus without complications: Secondary | ICD-10-CM

## 2023-10-02 MED ORDER — ICOSAPENT ETHYL 1 G PO CAPS
2.0000 g | ORAL_CAPSULE | Freq: Two times a day (BID) | ORAL | 11 refills | Status: AC
  Filled 2023-10-02: qty 120, 30d supply, fill #0
  Filled 2023-10-24: qty 120, 30d supply, fill #1
  Filled 2023-11-30: qty 120, 30d supply, fill #2
  Filled 2024-01-09: qty 120, 30d supply, fill #3
  Filled 2024-01-10: qty 120, 30d supply, fill #0
  Filled 2024-02-12: qty 120, 30d supply, fill #1
  Filled 2024-04-03: qty 120, 30d supply, fill #2
  Filled 2024-04-08: qty 120, 30d supply, fill #3

## 2023-10-02 MED ORDER — LANTUS SOLOSTAR 100 UNIT/ML ~~LOC~~ SOPN
27.0000 [IU] | PEN_INJECTOR | Freq: Every day | SUBCUTANEOUS | 6 refills | Status: AC
  Filled 2023-10-02: qty 24, 88d supply, fill #0
  Filled 2023-12-17: qty 24, 88d supply, fill #1
  Filled 2024-01-06 – 2024-02-27 (×2): qty 24, 88d supply, fill #2

## 2023-10-02 MED ORDER — PEN NEEDLES 32G X 4 MM MISC
6 refills | Status: AC
  Filled 2023-10-02: qty 100, 90d supply, fill #0
  Filled 2024-01-10 (×2): qty 100, 90d supply, fill #1
  Filled 2024-04-27: qty 100, 90d supply, fill #2

## 2023-10-02 NOTE — Telephone Encounter (Signed)
 Copied from CRM [id number]. Topic: Clinical - Prescription Issue >> Oct 02, 2023  1:52 PM Wess RAMAN wrote:  Reason for CRM: Patient needs his insulin  and the pharmacy notified him that Dr. Newlin has discontinued it. He state he is out and needs it today.  Callback [phone]

## 2023-10-02 NOTE — Telephone Encounter (Signed)
 Result discussed over the phone. Will be starting Repatha  from this week. Due to elevated TG we are adding Vascepa 2 gm twice daily. Will re-check level in 3 months.
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
## 2023-10-02 NOTE — Telephone Encounter (Signed)
 Newell calling from [facility] is calling to follow up on the medication request for the patient. As his medications have been denied. Please follow up with Melinda if there is something that can be done to support the patient. Patient is reporting that he is out of medication [phone].

## 2023-10-02 NOTE — Telephone Encounter (Signed)
 Copied from CRM [id number]. Topic: Clinical - Medication Question >> Oct 02, 2023  4:22 PM Yolanda T wrote:  Reason for CRM: patient called back again stated his pharmacy closes at 6pm today and he really need his insulin . Please f/u with patient for the med refill for Insulin  Pen Needle (PEN NEEDLES) 32G X 4 MM MISC and insulin  glargine (LANTUS  SOLOSTAR) 100 UNIT/ML Solostar Pen.

## 2023-10-03 ENCOUNTER — Other Ambulatory Visit (HOSPITAL_COMMUNITY): Payer: Self-pay

## 2023-10-03 MED ORDER — FUROSEMIDE 40 MG PO TABS
40.0000 mg | ORAL_TABLET | Freq: Two times a day (BID) | ORAL | 0 refills | Status: DC
  Filled 2023-10-03: qty 60, 30d supply, fill #0

## 2023-10-03 NOTE — Telephone Encounter (Signed)
 Requested medications are due for refill today.  yes  Requested medications are on the active medications list.  yes  Last refill. 06/22/2023 #60 0 rf  Future visit scheduled.   no  Notes to clinic.  Rx expired 09/28/2023.    Requested Prescriptions  Pending Prescriptions Disp Refills   furosemide  (LASIX ) 40 MG tablet 60 tablet 0    Sig: Take 1 tablet (40 mg total) by mouth 2 (two) times daily.     Cardiovascular:  Diuretics - Loop Failed - 10/03/2023  1:58 PM      Failed - Na in normal range and within 180 days    Sodium  Date Value Ref Range Status  07/21/2023 134 (L) 135 - 145 mmol/L Final  06/22/2023 138 134 - 144 mmol/L Final         Failed - Cr in normal range and within 180 days    Creat  Date Value Ref Range Status  12/12/2022 1.26 0.70 - 1.35 mg/dL Final   Creatinine, Ser  Date Value Ref Range Status  07/21/2023 1.51 (H) 0.61 - 1.24 mg/dL Final   Creatinine, Urine  Date Value Ref Range Status  08/12/2022 35 mg/dL Final    Comment:    Performed at [facility], [address], [place], [place] [address]         Failed - Valid encounter within last 6 months    Recent Outpatient Visits           3 months ago Type 2 diabetes mellitus with other specified complication, with long-term current use of insulin  [facility])   [facility] [place]. [facility], Jon M, [place]   7 months ago Type 2 diabetes mellitus with other specified complication, with long-term current use of insulin  [facility])   [facility] [place]. [facility] Delbert Clam, [place]   9 months ago HFimpEF (heart failure with improved ejection fraction) [facility])   [facility] [place]. [facility] Delbert Clam, MD   11 months ago Hospital discharge follow-up   [facility] [place]. [facility], [place], FNP   1 year ago Type  2 diabetes mellitus with hyperglycemia, with long-term current use of insulin  [facility])   [facility] [place]. [facility], Jon HERO, PA-C       Future Appointments             In 1 month Alv[place], Kristin L, RPH-CPP [facility] [employer] [facility] [employer]. [facility], [facility]   In 5 months Lucien, Orren SAILOR, PA-C [facility] [employer] [facility] [employer]. [facility], [facility]            Passed - K in normal range and within 180 days    Potassium  Date Value Ref Range Status  07/21/2023 4.0 3.5 - 5.1 mmol/L Final         Passed - Ca in normal range and within 180 days    Calcium   Date Value Ref Range Status  07/21/2023 8.9 8.9 - 10.3 mg/dL Final   Calcium , Ion  Date Value Ref Range Status  07/14/2023 1.28 1.15 - 1.40 mmol/L Final         Passed -  Cl in normal range and within 180 days    Chloride  Date Value Ref Range Status  07/21/2023 102 98 - 111 mmol/L Final         Passed - Mg Level in normal range and within 180 days    Magnesium   Date Value Ref Range Status  07/19/2023 2.0 1.7 - 2.4 mg/dL Final    Comment:    Performed at [facility], [address], [place], [place] [address]         Passed - Last BP in normal range    BP Readings from Last 1 Encounters:  09/25/23 104/70

## 2023-10-03 NOTE — Telephone Encounter (Signed)
 Sent to pharmacy by PCP

## 2023-10-04 ENCOUNTER — Other Ambulatory Visit: Payer: Self-pay

## 2023-10-04 ENCOUNTER — Ambulatory Visit: Attending: Student | Admitting: Physical Therapy

## 2023-10-04 ENCOUNTER — Other Ambulatory Visit (HOSPITAL_COMMUNITY): Payer: Self-pay

## 2023-10-04 DIAGNOSIS — M25652 Stiffness of left hip, not elsewhere classified: Secondary | ICD-10-CM | POA: Insufficient documentation

## 2023-10-04 DIAGNOSIS — M25552 Pain in left hip: Secondary | ICD-10-CM | POA: Diagnosis present

## 2023-10-04 DIAGNOSIS — M6281 Muscle weakness (generalized): Secondary | ICD-10-CM | POA: Diagnosis present

## 2023-10-04 DIAGNOSIS — R293 Abnormal posture: Secondary | ICD-10-CM | POA: Diagnosis present

## 2023-10-04 DIAGNOSIS — R2681 Unsteadiness on feet: Secondary | ICD-10-CM | POA: Diagnosis present

## 2023-10-04 DIAGNOSIS — R252 Cramp and spasm: Secondary | ICD-10-CM | POA: Insufficient documentation

## 2023-10-04 DIAGNOSIS — R262 Difficulty in walking, not elsewhere classified: Secondary | ICD-10-CM | POA: Insufficient documentation

## 2023-10-04 DIAGNOSIS — R2689 Other abnormalities of gait and mobility: Secondary | ICD-10-CM | POA: Diagnosis present
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
## 2023-10-09 ENCOUNTER — Ambulatory Visit

## 2023-10-10 ENCOUNTER — Other Ambulatory Visit: Payer: Self-pay

## 2023-10-10 ENCOUNTER — Other Ambulatory Visit (HOSPITAL_COMMUNITY): Payer: Self-pay

## 2023-10-10 ENCOUNTER — Other Ambulatory Visit: Payer: Self-pay | Admitting: Physician Assistant

## 2023-10-10 MED ORDER — FAMOTIDINE 20 MG PO TABS
20.0000 mg | ORAL_TABLET | Freq: Two times a day (BID) | ORAL | 1 refills | Status: DC
  Filled 2023-10-10: qty 60, 30d supply, fill #0
  Filled 2023-11-07: qty 60, 30d supply, fill #1

## 2023-10-13 ENCOUNTER — Ambulatory Visit: Admitting: Physical Therapy
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
## 2023-10-16 ENCOUNTER — Other Ambulatory Visit (HOSPITAL_COMMUNITY): Payer: Self-pay

## 2023-10-16 ENCOUNTER — Telehealth: Payer: Self-pay | Admitting: Rehabilitative and Restorative Service Providers"

## 2023-10-16 ENCOUNTER — Ambulatory Visit: Admitting: Rehabilitative and Restorative Service Providers"

## 2023-10-16 MED ORDER — ROSUVASTATIN CALCIUM 20 MG PO TABS
20.0000 mg | ORAL_TABLET | Freq: Every day | ORAL | 3 refills | Status: AC
  Filled 2023-10-16 – 2024-02-07 (×3): qty 90, 90d supply, fill #0
  Filled 2024-03-06 – 2024-05-05 (×2): qty 90, 90d supply, fill #1
  Filled 2024-05-06: qty 90, 90d supply, fill #2

## 2023-10-16 NOTE — Addendum Note (Signed)
 Addended by: Alva Kuenzel K on: 10/16/2023 08:15 AM   Modules accepted: Orders

## 2023-10-16 NOTE — Telephone Encounter (Signed)
 Rosuvastatin  was d/c'd from the list. Call to confirm patient is still on satin along with Repatha . Patient reports he has stopped taking it. Advised to restart statin and stay on Vascepa  and Repatha  along with statin. Prescription sent to [facility] pharmacy as per patient's request. Follow up lab due in Sept.

## 2023-10-16 NOTE — Telephone Encounter (Signed)
 Left patient a message for patient secondary to missed PT visit at 8:00 AM on 10/16/2023 and offered an appointment later this morning, if desired.

## 2023-10-18 ENCOUNTER — Encounter: Payer: Self-pay | Admitting: Rehabilitative and Restorative Service Providers"

## 2023-10-18 ENCOUNTER — Ambulatory Visit: Admitting: Rehabilitative and Restorative Service Providers"

## 2023-10-18 DIAGNOSIS — R262 Difficulty in walking, not elsewhere classified: Secondary | ICD-10-CM

## 2023-10-18 DIAGNOSIS — R2681 Unsteadiness on feet: Secondary | ICD-10-CM

## 2023-10-18 DIAGNOSIS — M25652 Stiffness of left hip, not elsewhere classified: Secondary | ICD-10-CM

## 2023-10-18 DIAGNOSIS — M6281 Muscle weakness (generalized): Secondary | ICD-10-CM

## 2023-10-18 DIAGNOSIS — R293 Abnormal posture: Secondary | ICD-10-CM

## 2023-10-18 DIAGNOSIS — M25552 Pain in left hip: Secondary | ICD-10-CM | POA: Diagnosis not present

## 2023-10-18 DIAGNOSIS — R252 Cramp and spasm: Secondary | ICD-10-CM
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
## 2023-10-20 ENCOUNTER — Ambulatory Visit: Admitting: Physical Therapy

## 2023-10-20 ENCOUNTER — Encounter: Payer: Self-pay | Admitting: Physical Therapy

## 2023-10-20 ENCOUNTER — Encounter

## 2023-10-20 DIAGNOSIS — R262 Difficulty in walking, not elsewhere classified: Secondary | ICD-10-CM

## 2023-10-20 DIAGNOSIS — R252 Cramp and spasm: Secondary | ICD-10-CM

## 2023-10-20 DIAGNOSIS — M25652 Stiffness of left hip, not elsewhere classified: Secondary | ICD-10-CM

## 2023-10-20 DIAGNOSIS — M25552 Pain in left hip: Secondary | ICD-10-CM | POA: Diagnosis not present

## 2023-10-20 DIAGNOSIS — R2689 Other abnormalities of gait and mobility: Secondary | ICD-10-CM

## 2023-10-20 DIAGNOSIS — R2681 Unsteadiness on feet: Secondary | ICD-10-CM

## 2023-10-20 DIAGNOSIS — M6281 Muscle weakness (generalized): Secondary | ICD-10-CM

## 2023-10-20 NOTE — Therapy (Signed)
 OUTPATIENT PHYSICAL THERAPY TREATMENT NOTE   Patient Name: David [name] MRN: [id number] DOB:March 16, 1961, 63 y.o., male Today's Date: 10/20/2023  END OF SESSION:  PT End of Session - 10/20/23 1416     Visit Number 5    Date for PT Re-Evaluation 11/23/23    Authorization Type Medicaid no auth 27 visits    PT Start Time 1340    PT Stop Time 1420    PT Time Calculation (min) 40 min    Activity Tolerance Patient tolerated treatment well    Behavior During Therapy WFL for tasks assessed/performed             Past Medical History:  Diagnosis Date   Cardiomyopathy (HCC)    a. EF 45% in 2019.   CHF (congestive heart failure) (HCC)    Chronic anticoagulation 09/30/2017   Diabetes mellitus type 2 in nonobese [facility])    Discitis of thoracic region 09/25/2022   Does not have health insurance    Essential hypertension 09/26/2017   Financial difficulties    H/O noncompliance with medical treatment, presenting hazards to health    Hardware complicating wound infection (HCC) 09/25/2022   Non-insulin  treated type 2 diabetes mellitus (HCC) 09/26/2017   Persistent atrial fibrillation (HCC)    Sleep apnea suspected 09/30/2017   years ago- not anymore per patient   Past Surgical History:  Procedure Laterality Date   ABDOMINAL AORTOGRAM W/LOWER EXTREMITY N/A 09/02/2021   Procedure: ABDOMINAL AORTOGRAM W/LOWER EXTREMITY;  Surgeon: Serene Gaile ORN, MD;  Location: [facility];  Service: Cardiovascular;  Laterality: N/A;   ABDOMINAL AORTOGRAM W/LOWER EXTREMITY N/A 12/28/2021   Procedure: ABDOMINAL AORTOGRAM W/LOWER EXTREMITY;  Surgeon: Serene Gaile ORN, MD;  Location: [facility];  Service: Cardiovascular;  Laterality: N/A;   ABDOMINAL AORTOGRAM W/LOWER EXTREMITY N/A 07/28/2022   Procedure: ABDOMINAL AORTOGRAM W/LOWER EXTREMITY;  Surgeon: Gretta Lonni PARAS, MD;  Location: [facility];  Service: Cardiovascular;  Laterality: N/A;   AMPUTATION Right 09/04/2021    Procedure: 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;  Surgeon: Harden Jerona GAILS, MD;  Location: [facility] OR;  Service: Orthopedics;  Laterality: Right;   AMPUTATION Right 08/02/2022   Procedure: AMPUTATION RAY 5TH METATARSAL OF RIGHT FOOT;  Surgeon: Barton Drape, MD;  Location: [facility] OR;  Service: Orthopedics;  Laterality: Right;   APPENDECTOMY  1971   BONE BIOPSY Right 08/02/2022   Procedure: BONE BIOPSY OF 5TH METATARSAL RIGHT FOOT;  Surgeon: Barton Drape, MD;  Location: [facility];  Service: Orthopedics;  Laterality: Right;   CARDIOVERSION N/A 09/28/2017   Procedure: CARDIOVERSION;  Surgeon: Francyne Headland, MD;  [facility]: [facility];  Service: Cardiovascular;  Laterality: N/A;   CLIPPING OF ATRIAL APPENDAGE N/A 04/18/2023   Procedure: CLIPPING OF ATRIAL APPENDAGE USING ATRICURE ATRICLIP [id number] with pulmonary vein isolation ablation;  Surgeon: Lucas Dorise POUR, MD;  Location: [facility] OR;  Service: Open Heart Surgery;  Laterality: N/A;   CORONARY ARTERY BYPASS GRAFT N/A 04/18/2023   Procedure: CORONARY ARTERY BYPASS GRAFTING (CABG) TIMES FOUR USING LEFT INTERNAL MAMMARY ARTERY AND ENDOSCOPICALLY HARVESTED LEFT GREATER SAPHENOUS VEIN;  Surgeon: Lucas Dorise POUR, MD;  Location: [facility];  Service: Open Heart Surgery;  Laterality: N/A;   IRRIGATION AND DEBRIDEMENT FOOT  08/02/2022   Procedure: IRRIGATION AND DEBRIDEMENT RIGHT 5TH METATARSAL FOOT;  Surgeon: Barton Drape, MD;  Location: [facility];  Service: Orthopedics;;   LAMINECTOMY WITH POSTERIOR LATERAL ARTHRODESIS LEVEL 4 N/A 08/30/2022   Procedure: THORACIC FIVE-THORACIC ELEVEN FUSION, THORACIC EIGHT TRANSPEDICULAR DECOMPRESSION AND  PARTIAL CORPECTOMY with O-Arm;  Surgeon: Debby Dorn MATSU, MD;  Location: [facility] OR;  Service: Neurosurgery;  Laterality: N/A;   LEFT HEART CATH AND CORONARY ANGIOGRAPHY N/A 04/12/2023   Procedure: LEFT HEART CATH AND CORONARY ANGIOGRAPHY;  Surgeon: Verlin Lonni BIRCH, MD;  [facility]: [facility];  Service: Cardiovascular;  Laterality:  N/A;   NASAL ENDOSCOPY Right 06/30/2023   Procedure: ENDOSCOPY, NOSE;  Surgeon: Jesus Oliphant, MD;  Location: [facility] OR;  Service: ENT;  Laterality: Right;   PERIPHERAL VASCULAR BALLOON ANGIOPLASTY  09/02/2021   Procedure: PERIPHERAL VASCULAR BALLOON ANGIOPLASTY;  Surgeon: Serene Gaile ORN, MD;  [facility];  Service: Cardiovascular;;   PERIPHERAL VASCULAR BALLOON ANGIOPLASTY  12/28/2021   Procedure: PERIPHERAL VASCULAR BALLOON ANGIOPLASTY;  Surgeon: Serene Gaile ORN, MD;  [facility];  Service: Cardiovascular;;  Posterior Tibial PTA only   POLYPECTOMY Right 06/30/2023   Procedure: POLYPECTOMY, NASAL CAVITY;  Surgeon: Jesus Oliphant, MD;  Location: [facility] OR;  Service: ENT;  Laterality: Right;   TEE WITHOUT CARDIOVERSION N/A 09/28/2017   Procedure: TRANSESOPHAGEAL ECHOCARDIOGRAM (TEE);  Surgeon: Francyne Headland, MD;  Location: [facility] ENDOSCOPY;  Service: Cardiovascular;  Laterality: N/A;   TEE WITHOUT CARDIOVERSION N/A 09/03/2021   Procedure: TRANSESOPHAGEAL ECHOCARDIOGRAM (TEE);  Surgeon: Raford Riggs, MD;  Location: [facility] ENDOSCOPY;  Service: Cardiovascular;  Laterality: N/A;   TEE WITHOUT CARDIOVERSION N/A 07/15/2022   Procedure: TRANSESOPHAGEAL ECHOCARDIOGRAM;  Surgeon: Francyne Headland, MD;  [facility];  Service: Cardiovascular;  Laterality: N/A;   TEE WITHOUT CARDIOVERSION N/A 04/18/2023   Procedure: TRANSESOPHAGEAL ECHOCARDIOGRAM (TEE);  Surgeon: Lucas Dorise POUR, MD;  Location: [facility] OR;  Service: Open Heart Surgery;  Laterality: N/A;   TOTAL HIP ARTHROPLASTY Left 07/14/2023   Procedure: ARTHROPLASTY, HIP, TOTAL, ANTERIOR APPROACH;  Surgeon: Kendal Franky SQUIBB, MD;  Location: [facility] OR;  Service: Orthopedics;  Laterality: Left;   Patient Active Problem List   Diagnosis Date Noted   Left displaced femoral neck fracture (HCC) 07/13/2023   Closed left femoral fracture (HCC) 07/12/2023   History of CHF (congestive heart failure) 07/12/2023   Chronic kidney disease  (CKD), stage III (moderate) (HCC) 07/12/2023   Hyperlipidemia 07/12/2023   BPH (benign prostatic hyperplasia) 07/12/2023   Peripheral neuropathy 07/12/2023   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   Bradycardia 04/14/2023   NSTEMI (non-ST elevated myocardial infarction) (HCC) 04/13/2023   Chronic bilateral low back pain 04/13/2023   History of CABG in  04/2023 04/12/2023   Acute decompensated heart failure (HCC) 04/10/2023   Chronic pain syndrome 04/09/2023   MRSA infection greater than 3 months ago 04/09/2023   Hypoglycemia 10/06/2022   Pleural effusion 09/29/2022   Chronic systolic CHF (congestive heart failure) (HCC) 09/29/2022   Acute urinary retention 09/29/2022   Anxiety 09/29/2022   Hardware complicating wound infection (HCC) 09/25/2022   Discitis of thoracic region 09/25/2022   Hypokalemia 09/07/2022   Acute toxic encephalopathy 09/07/2022   Acute postoperative anemia due to expected blood loss 09/07/2022   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   Adjustment disorder with mixed anxiety and depressed mood 08/13/2022   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 08/08/2022   MRSA bacteremia 08/08/2022   AKI (acute kidney injury), ruled out 07/27/2022   Discitis thoracic region 07/27/2022   Bacteremia due to methicillin resistant Staphylococcus aureus 07/27/2022   Severe protein-calorie malnutrition (HCC) 07/26/2022   PVD (peripheral vascular disease) (HCC) 07/26/2022   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   Bacteremia 07/15/2022   Atelectasis 07/12/2022   Sepsis due  to pneumonia (HCC) 07/12/2022   Acute on chronic diastolic (congestive) heart failure (HCC) 07/12/2022   Gangrene of toe of right foot (HCC)    Sepsis (HCC) 08/29/2021   SIRS (systemic inflammatory response syndrome) (HCC) 08/29/2021   Hyponatremia 08/29/2021   Hypomagnesemia 08/29/2021   Pure hypercholesterolemia 06/17/2021   Pain of left hand 09/06/2019   Cellulitis 08/26/2019   Persistent atrial fibrillation (HCC) 08/26/2019    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) 10/18/2018   Transaminitis 10/17/2018   Hyperbilirubinemia 10/17/2018   Leukocytosis 10/17/2018   Sinusitis 10/17/2018   Chronic anticoagulation 09/30/2017   Smoker 09/30/2017   Cardiomyopathy (HCC) 09/30/2017   Sleep apnea suspected 09/30/2017   Essential hypertension 09/26/2017   Insulin  dependent type 2 diabetes mellitus (HCC) 09/26/2017   Permanent atrial fibrillation (HCC) 09/26/2017    PCP: Delbert Clam, MD  REFERRING PROVIDER: Kendal Franky SQUIBB, MD  REFERRING DIAG: [phone] (ICD-10-CM) - Status post total hip replacement, left  THERAPY DIAG:  Pain in left hip  Stiffness of left hip, not elsewhere classified  Muscle weakness (generalized)  Difficulty in walking, not elsewhere classified  Cramp and spasm  Unsteadiness on feet  Other abnormalities of gait and mobility  Rationale for Evaluation and Treatment: Rehabilitation  ONSET DATE: 09/26/2023  SUBJECTIVE:   SUBJECTIVE STATEMENT: Ready for a good workout. Pt very motivated today.  EVAL: Patient had series of orthopedic issues. He was in PT for back pain when he was found to have MRSA infection in spine May 2024, surgery followed for fusion/reconstruction, January had CABG, was still on cane once he got home and fell in bathroom and suffered hip fracture on left with subsequent THA. His THA was on 07/14/23. He was in rehab for several weeks and is now home. He felt he did not get aggressive enough PT in rehab. He then had home health and only did this 1 time per week. He did his exercises and is very motivated to get his function back. He is unsure if he will return to work. He is concerned about the physicality of the job with all that he has endured. He states this all depends on how much function he gets back. His goal is to be able to walk without the cane and build some upper body strength to be able to function at the level he did before and do his  job.   PERTINENT HISTORY: Left LE neural impairment since back surgery; right toe amputations See above for extensive medical history  PAIN: 10/18/23 Are you having pain? Yes: NPRS scale: Currently 4-5/10 Pain location: left hip Pain description: aching / stiff Aggravating factors: sitting Relieving factors: moving  PRECAUTIONS: Fall  RED FLAGS: None   WEIGHT BEARING RESTRICTIONS: No  FALLS:  Has patient fallen in last 6 months? Yes. Number of falls 1  LIVING ENVIRONMENT: Lives with: lives with their spouse Lives in: House/apartment Stairs: No Has following equipment at home: Single point cane  OCCUPATION: chef  PLOF: Independent, Independent with basic ADLs, Independent with household mobility without device, Independent with community mobility without device, Independent with homemaking with ambulation, Independent with gait, and Independent with transfers  PATIENT GOALS: To be able to walk without the cane and build some upper body strength to be able to function at the level he did before and do his job.    NEXT MD VISIT: 2 months  OBJECTIVE:  Note: Objective measures were completed at Evaluation unless otherwise noted.  DIAGNOSTIC FINDINGS: na  PATIENT SURVEYS:  LEFS : 30/80=  COGNITION: Overall cognitive status: Within functional limits for tasks assessed     SENSATION: WFL   MUSCLE LENGTH: Hips tight bilaterally in hip flexors, quads and rotation both IR and ER   Left > Right  POSTURE: rounded shoulders, forward head, decreased lumbar lordosis, posterior pelvic tilt, and flexed trunk    LOWER EXTREMITY ROM:  WFL  LOWER EXTREMITY MMT:  MMT Right eval Left eval  Hip flexion  4  Hip extension  4-  Hip abduction  4  Hip adduction  4  Hip internal rotation  4  Hip external rotation  4  Knee flexion  4  Knee extension  4+  Ankle dorsiflexion    Ankle plantarflexion    Ankle inversion    Ankle eversion     (Blank rows = not  tested)   FUNCTIONAL TESTS:  5 times sit to stand: 15.14 sec (no UE use) Timed up and go (TUG): 14.00 sec  GAIT: Distance walked: 50 feet Assistive device utilized: Single point cane Level of assistance: SBA Comments: antalgic/guarded/ slow                                                                                                                                TREATMENT DATE:   10/20/23:Pt arrives for aquatic physical therapy. Treatment took place in 3.5-5.5 feet of water. Water temperature was 90 degrees F. Pt entered the pool via stairs with moderate use of hand rails. Pt requires buoyancy of water for support and to offload joints with strengthening exercises.  Seated water bench with 75% submersion Pt performed seated LE AROM exercises 20x in all planes,concurrent discussion of status. 75% depth water walking 10x in each direction with Q-tip. Standing hip 3 ways with min-mod push/pull 15x Bil. High knee marching in place holding Q-tip 2x20. Sit to stand from 3rd step 5x. Requires stabilization from hand rail, VC to get weight forward. Underwater seated decompression with noodle behind him 3 min.    10/18/23: NuStep L4 6 min, arms around 9-10 Leg press seat 8: 60# 15x Leg press seat 8: 80# 15x WB on Lt 3x10 on 6 in step WB Rt 2x10 on 6 in step Marches bilaterally 2x10 Quad set bilaterally in seated 2x10 Resistance band at ankle black DF 3x30 with noted substitutions from hip/knee Seated 4 way left ankle strengthening with green tband 1x10 each  Sit to stands 2x 5 without UE support Heel/toe raises at barre 2x10 with cuing for improved technique    PATIENT EDUCATION:  Education details: Initiated HEP Person educated: Patient Education method: Programmer, multimedia, Facilities manager, Verbal cues, and Handouts Education comprehension: verbalized understanding and returned demonstration  HOME EXERCISE PROGRAM: Access Code: [email or web address] URL: [email or web address] Date:  10/04/2023 Prepared by: Glade Pesa  Exercises - Squat with Chair Touch  - 1 x daily - 7 x weekly - 4 sets - 5 reps - Side Stepping with Resistance at  Ankles  - 1 x daily - 7 x weekly - 3 sets - 10 reps - Seated Hip Abduction with Resistance  - Alvah pushes himself a lot while exercising in the water, possibly pushes himself too hard. Increased workload today by increasing reps and difficulty of each exercise.. Qtip provided less support with standing activities. Sit to stand exercise extremely difficult.     OBJECTIVE IMPAIRMENTS: Abnormal gait, cardiopulmonary status limiting activity, decreased activity tolerance, decreased balance, decreased endurance, difficulty walking, decreased ROM, decreased strength, increased fascial restrictions, increased muscle spasms, impaired flexibility, impaired UE functional use, improper body mechanics, postural dysfunction, and pain.   ACTIVITY LIMITATIONS: carrying, lifting, bending, sitting, standing, squatting, sleeping, stairs, transfers, bed mobility, bathing, toileting, dressing, reach over head, hygiene/grooming, and caring for others  PARTICIPATION LIMITATIONS: meal prep, cleaning, laundry, driving, shopping, community activity, occupation, yard work, and church  PERSONAL FACTORS: Fitness, Past/current experiences, Profession, Time since onset of injury/illness/exacerbation, and 3+ comorbidities: spine surgery, CABG, diabetes and Htn are also affecting patient's functional outcome.   REHAB POTENTIAL: Good  CLINICAL DECISION MAKING: Unstable/unpredictable  EVALUATION COMPLEXITY: High   GOALS: Goals reviewed with patient? Yes  SHORT TERM GOALS: Target date: 10/26/2023  Pain report to be no greater than 4/10  Baseline: Goal status: Ongoing  2.  Patient will be independent with initial HEP  Baseline:  Goal status: Met on 10/18/23  3.  Patient to be able to ambulate 50 feet without a.d Baseline:  Goal status: Ongoing  4.  Patient to be  able to do 10 sit to stand without UE support  Baseline:  Goal status: Ongoing    LONG TERM GOALS: Target date: 11/23/2023  Patient to report pain no greater than 2/10  Baseline:  Goal status: INITIAL  2.  Patient to be independent with advanced HEP  Baseline:  Goal status: INITIAL  3.  Patient to be able to stand or walk for at least 15 min without pain or need to rest Baseline:  Goal status: INITIAL  4.  Patient to be able to ascend and descend 12 steps safely Baseline:  Goal status: INITIAL  5.  Patient to report 85% improvement in overall symptoms and function Baseline:  Goal status: INITIAL  6.  Patient to be able to walk 500 feet without a.d. safely without rest breaks Baseline:  Goal status: INITIAL   PLAN:  PT FREQUENCY: 3x/week  PT DURATION: 8 weeks  PLANNED INTERVENTIONS: 97110-Therapeutic exercises, 97530- Therapeutic activity, 97112- Neuromuscular re-education, 97535- Self Care, [id number]- Manual therapy, [phone]- Gait training, [phone]- Aquatic Therapy, [phone]- Electrical stimulation (unattended), [phone]- Electrical stimulation (manual), [id number]- Vasopneumatic device, 97035- Ultrasound, [id number]- Ionotophoresis 4mg /ml Dexamethasone , Patient/Family education, Balance training, Stair training, Taping, Joint mobilization, Spinal mobilization, Cryotherapy, and Moist heat  PLAN FOR NEXT SESSION: Nustep, continue functional strength training; patient has been in rehab and home health: he needs higher level functional training; could benefit from AFO if needed for foot drop and genu recurvatum depending on motor return  Delon Darner, PTA 10/20/23 2:17 PM

## 2023-10-23 ENCOUNTER — Telehealth: Payer: Self-pay | Admitting: Family Medicine

## 2023-10-23 ENCOUNTER — Encounter: Payer: Self-pay | Admitting: Rehabilitative and Restorative Service Providers"

## 2023-10-23 ENCOUNTER — Ambulatory Visit: Admitting: Rehabilitative and Restorative Service Providers"

## 2023-10-23 DIAGNOSIS — R252 Cramp and spasm: Secondary | ICD-10-CM

## 2023-10-23 DIAGNOSIS — R2689 Other abnormalities of gait and mobility: Secondary | ICD-10-CM

## 2023-10-23 DIAGNOSIS — M25552 Pain in left hip: Secondary | ICD-10-CM | POA: Diagnosis not present

## 2023-10-23 DIAGNOSIS — R293 Abnormal posture: Secondary | ICD-10-CM

## 2023-10-23 DIAGNOSIS — M6281 Muscle weakness (generalized): Secondary | ICD-10-CM

## 2023-10-23 DIAGNOSIS — R262 Difficulty in walking, not elsewhere classified: Secondary | ICD-10-CM

## 2023-10-23 DIAGNOSIS — M25652 Stiffness of left hip, not elsewhere classified: Secondary | ICD-10-CM

## 2023-10-23 DIAGNOSIS — R2681 Unsteadiness on feet: Secondary | ICD-10-CM

## 2023-10-23 NOTE — Telephone Encounter (Signed)
 Contacted pt phone not in service!

## 2023-10-23 NOTE — Therapy (Addendum)
 OUTPATIENT PHYSICAL THERAPY TREATMENT NOTE   Patient Name: David [name] MRN: [id number] DOB:28-Dec-1960, 63 y.o., male Today's Date: 10/23/2023  END OF SESSION:  PT End of Session - 10/23/23 1152     Visit Number 6    Date for PT Re-Evaluation 11/23/23    Authorization Type Medicaid no auth 27 visits    PT Start Time 0800    PT Stop Time 0850    PT Time Calculation (min) 50 min    Activity Tolerance Patient tolerated treatment well    Behavior During Therapy [facility] for tasks assessed/performed              Past Medical History:  Diagnosis Date   Cardiomyopathy (HCC)    a. EF 45% in 2019.   CHF (congestive heart failure) (HCC)    Chronic anticoagulation 09/30/2017   Diabetes mellitus type 2 in nonobese [facility])    Discitis of thoracic region 09/25/2022   Does not have health insurance    Essential hypertension 09/26/2017   Financial difficulties    H/O noncompliance with medical treatment, presenting hazards to health    Hardware complicating wound infection (HCC) 09/25/2022   Non-insulin  treated type 2 diabetes mellitus (HCC) 09/26/2017   Persistent atrial fibrillation (HCC)    Sleep apnea suspected 09/30/2017   years ago- not anymore per patient   Past Surgical History:  Procedure Laterality Date   ABDOMINAL AORTOGRAM W/LOWER EXTREMITY N/A 09/02/2021   Procedure: ABDOMINAL AORTOGRAM W/LOWER EXTREMITY;  Surgeon: Serene Gaile ORN, MD;  [facility]: [facility];  Service: Cardiovascular;  Laterality: N/A;   ABDOMINAL AORTOGRAM W/LOWER EXTREMITY N/A 12/28/2021   Procedure: ABDOMINAL AORTOGRAM W/LOWER EXTREMITY;  Surgeon: Serene Gaile ORN, MD;  [facility]: [facility];  Service: Cardiovascular;  Laterality: N/A;   ABDOMINAL AORTOGRAM W/LOWER EXTREMITY N/A 07/28/2022   Procedure: ABDOMINAL AORTOGRAM W/LOWER EXTREMITY;  Surgeon: Gretta Lonni PARAS, MD;  [facility]: [facility];  Service: Cardiovascular;  Laterality: N/A;   AMPUTATION Right 09/04/2021    Procedure: 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;  Surgeon: Harden Jerona GAILS, MD;  Location: [facility] OR;  Service: Orthopedics;  Laterality: Right;   AMPUTATION Right 08/02/2022   Procedure: AMPUTATION RAY 5TH METATARSAL OF RIGHT FOOT;  Surgeon: Barton Drape, MD;  Location: [facility] OR;  Service: Orthopedics;  Laterality: Right;   APPENDECTOMY  1971   BONE BIOPSY Right 08/02/2022   Procedure: BONE BIOPSY OF 5TH METATARSAL RIGHT FOOT;  Surgeon: Barton Drape, MD;  Location: [facility] OR;  Service: Orthopedics;  Laterality: Right;   CARDIOVERSION N/A 09/28/2017   Procedure: CARDIOVERSION;  Surgeon: Francyne Headland, MD;  [facility];  Service: Cardiovascular;  Laterality: N/A;   CLIPPING OF ATRIAL APPENDAGE N/A 04/18/2023   Procedure: CLIPPING OF ATRIAL APPENDAGE USING ATRICURE ATRICLIP [id number] with pulmonary vein isolation ablation;  Surgeon: Lucas Dorise POUR, MD;  Location: [facility] OR;  Service: Open Heart Surgery;  Laterality: N/A;   CORONARY ARTERY BYPASS GRAFT N/A 04/18/2023   Procedure: CORONARY ARTERY BYPASS GRAFTING (CABG) TIMES FOUR USING LEFT INTERNAL MAMMARY ARTERY AND ENDOSCOPICALLY HARVESTED LEFT GREATER SAPHENOUS VEIN;  Surgeon: Lucas Dorise POUR, MD;  [facility]: [facility] OR;  Service: Open Heart Surgery;  Laterality: N/A;   IRRIGATION AND DEBRIDEMENT FOOT  08/02/2022   Procedure: IRRIGATION AND DEBRIDEMENT RIGHT 5TH METATARSAL FOOT;  Surgeon: Barton Drape, MD;  [facility];  Service: Orthopedics;;   LAMINECTOMY WITH POSTERIOR LATERAL ARTHRODESIS LEVEL 4 N/A 08/30/2022   Procedure: THORACIC FIVE-THORACIC ELEVEN FUSION, THORACIC EIGHT TRANSPEDICULAR DECOMPRESSION  AND PARTIAL CORPECTOMY with O-Arm;  Surgeon: Debby Dorn MATSU, MD;  Location: [facility] OR;  Service: Neurosurgery;  Laterality: N/A;   LEFT HEART CATH AND CORONARY ANGIOGRAPHY N/A 04/12/2023   Procedure: LEFT HEART CATH AND CORONARY ANGIOGRAPHY;  Surgeon: Verlin Lonni BIRCH, MD;  [facility];  Service: Cardiovascular;  Laterality:  N/A;   NASAL ENDOSCOPY Right 06/30/2023   Procedure: ENDOSCOPY, NOSE;  Surgeon: Jesus Oliphant, MD;  Location: [facility] OR;  Service: ENT;  Laterality: Right;   PERIPHERAL VASCULAR BALLOON ANGIOPLASTY  09/02/2021   Procedure: PERIPHERAL VASCULAR BALLOON ANGIOPLASTY;  Surgeon: Serene Gaile ORN, MD;  [facility];  Service: Cardiovascular;;   PERIPHERAL VASCULAR BALLOON ANGIOPLASTY  12/28/2021   Procedure: PERIPHERAL VASCULAR BALLOON ANGIOPLASTY;  Surgeon: Serene Gaile ORN, MD;  [facility];  Service: Cardiovascular;;  Posterior Tibial PTA only   POLYPECTOMY Right 06/30/2023   Procedure: POLYPECTOMY, NASAL CAVITY;  Surgeon: Jesus Oliphant, MD;  Location: [facility] OR;  Service: ENT;  Laterality: Right;   TEE WITHOUT CARDIOVERSION N/A 09/28/2017   Procedure: TRANSESOPHAGEAL ECHOCARDIOGRAM (TEE);  Surgeon: Francyne Headland, MD;  Location: [facility] ENDOSCOPY;  Service: Cardiovascular;  Laterality: N/A;   TEE WITHOUT CARDIOVERSION N/A 09/03/2021   Procedure: TRANSESOPHAGEAL ECHOCARDIOGRAM (TEE);  Surgeon: Raford Riggs, MD;  Location: [facility] ENDOSCOPY;  Service: Cardiovascular;  Laterality: N/A;   TEE WITHOUT CARDIOVERSION N/A 07/15/2022   Procedure: TRANSESOPHAGEAL ECHOCARDIOGRAM;  Surgeon: Francyne Headland, MD;  [facility];  Service: Cardiovascular;  Laterality: N/A;   TEE WITHOUT CARDIOVERSION N/A 04/18/2023   Procedure: TRANSESOPHAGEAL ECHOCARDIOGRAM (TEE);  Surgeon: Lucas Dorise POUR, MD;  Location: [facility] OR;  Service: Open Heart Surgery;  Laterality: N/A;   TOTAL HIP ARTHROPLASTY Left 07/14/2023   Procedure: ARTHROPLASTY, HIP, TOTAL, ANTERIOR APPROACH;  Surgeon: Kendal Franky SQUIBB, MD;  Location[facility] OR;  Service: Orthopedics;  Laterality: Left;   Patient Active Problem List   Diagnosis Date Noted   Left displaced femoral neck fracture (HCC) 07/13/2023   Closed left femoral fracture (HCC) 07/12/2023   History of CHF (congestive heart failure) 07/12/2023   Chronic kidney disease  (CKD), stage III (moderate) (HCC) 07/12/2023   Hyperlipidemia 07/12/2023   BPH (benign prostatic hyperplasia) 07/12/2023   Peripheral neuropathy 07/12/2023   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   Bradycardia 04/14/2023   NSTEMI (non-ST elevated myocardial infarction) (HCC) 04/13/2023   Chronic bilateral low back pain 04/13/2023   History of CABG in  04/2023 04/12/2023   Acute decompensated heart failure (HCC) 04/10/2023   Chronic pain syndrome 04/09/2023   MRSA infection greater than 3 months ago 04/09/2023   Hypoglycemia 10/06/2022   Pleural effusion 09/29/2022   Chronic systolic CHF (congestive heart failure) (HCC) 09/29/2022   Acute urinary retention 09/29/2022   Anxiety 09/29/2022   Hardware complicating wound infection (HCC) 09/25/2022   Discitis of thoracic region 09/25/2022   Hypokalemia 09/07/2022   Acute toxic encephalopathy 09/07/2022   Acute postoperative anemia due to expected blood loss 09/07/2022   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   Adjustment disorder with mixed anxiety and depressed mood 08/13/2022   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 08/08/2022   MRSA bacteremia 08/08/2022   AKI (acute kidney injury), ruled out 07/27/2022   Discitis thoracic region 07/27/2022   Bacteremia due to methicillin resistant Staphylococcus aureus 07/27/2022   Severe protein-calorie malnutrition (HCC) 07/26/2022   PVD (peripheral vascular disease) (HCC) 07/26/2022   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   Bacteremia 07/15/2022   Atelectasis 07/12/2022   Sepsis  due to pneumonia (HCC) 07/12/2022   Acute on chronic diastolic (congestive) heart failure (HCC) 07/12/2022   Gangrene of toe of right foot (HCC)    Sepsis (HCC) 08/29/2021   SIRS (systemic inflammatory response syndrome) (HCC) 08/29/2021   Hyponatremia 08/29/2021   Hypomagnesemia 08/29/2021   Pure hypercholesterolemia 06/17/2021   Pain of left hand 09/06/2019   Cellulitis 08/26/2019   Persistent atrial fibrillation (HCC) 08/26/2019    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) 10/18/2018   Transaminitis 10/17/2018   Hyperbilirubinemia 10/17/2018   Leukocytosis 10/17/2018   Sinusitis 10/17/2018   Chronic anticoagulation 09/30/2017   Smoker 09/30/2017   Cardiomyopathy (HCC) 09/30/2017   Sleep apnea suspected 09/30/2017   Essential hypertension 09/26/2017   Insulin  dependent type 2 diabetes mellitus (HCC) 09/26/2017   Permanent atrial fibrillation (HCC) 09/26/2017    PCP: Delbert Clam, MD  REFERRING PROVIDER: Kendal Franky SQUIBB, MD  REFERRING DIAG: [phone] (ICD-10-CM) - Status post total hip replacement, left  THERAPY DIAG:  Pain in left hip  Stiffness of left hip, not elsewhere classified  Muscle weakness (generalized)  Unsteadiness on feet  Cramp and spasm  Difficulty in walking, not elsewhere classified  Abnormal posture  Other abnormalities of gait and mobility  Rationale for Evaluation and Treatment: Rehabilitation  ONSET DATE: 09/26/2023  SUBJECTIVE:   SUBJECTIVE STATEMENT:  Pt reports falling when he got home after the pool, from going to stand up from his recliner, distributing most his weight on the right leg when the knee buckled. Pt states he fell to his left, bruising left side of forehead and left ribs roughly T10-T-12 area. Pt reports it only hurts when he bends over to tie his shoes rating it as 8-9/10 but once he straightens back up pain goes away. Pt thinks it was the sit to stands he performed in the pool the day before, where his knees were lower than usual that was very challenging.  NO pain reported in L hip or Rt hip.    EVAL: Patient had series of orthopedic issues. He was in PT for back pain when he was found to have MRSA infection in spine May 2024, surgery followed for fusion/reconstruction, January had CABG, was still on cane once he got home and fell in bathroom and suffered hip fracture on left with subsequent THA. His THA was on 07/14/23. He was in  rehab for several weeks and is now home. He felt he did not get aggressive enough PT in rehab. He then had home health and only did this 1 time per week. He did his exercises and is very motivated to get his function back. He is unsure if he will return to work. He is concerned about the physicality of the job with all that he has endured. He states this all depends on how much function he gets back. His goal is to be able to walk without the cane and build some upper body strength to be able to function at the level he did before and do his job.   PERTINENT HISTORY: Left LE neural impairment since back surgery; right toe amputations See above for extensive medical history  PAIN: 10/23/23 Are you having pain? Yes: NPRS scale: Currently 5-9/10 Pain location: left hip Pain description: aching / stiff Aggravating factors: sitting Relieving factors: moving  PRECAUTIONS: Fall  RED FLAGS: None   WEIGHT BEARING RESTRICTIONS: No  FALLS:  Has patient fallen in last 6 months? Yes. Number of falls 1  LIVING ENVIRONMENT: Lives with:  lives with their spouse Lives in: House/apartment Stairs: No Has following equipment at home: Single point cane  OCCUPATION: chef  PLOF: Independent, Independent with basic ADLs, Independent with household mobility without device, Independent with community mobility without device, Independent with homemaking with ambulation, Independent with gait, and Independent with transfers  PATIENT GOALS: To be able to walk without the cane and build some upper body strength to be able to function at the level he did before and do his job.    NEXT MD VISIT: 2 months  OBJECTIVE:  Note: Objective measures were completed at Evaluation unless otherwise noted.  DIAGNOSTIC FINDINGS: na  PATIENT SURVEYS:  LEFS : 30/80= 50%  COGNITION: Overall cognitive status: Within functional limits for tasks assessed     SENSATION: WFL   MUSCLE LENGTH: Hips tight bilaterally in  hip flexors, quads and rotation both IR and ER   Left > Right  POSTURE: rounded shoulders, forward head, decreased lumbar lordosis, posterior pelvic tilt, and flexed trunk    LOWER EXTREMITY ROM:  WFL  LOWER EXTREMITY MMT:  MMT Right eval Left eval  Hip flexion  4  Hip extension  4-  Hip abduction  4  Hip adduction  4  Hip internal rotation  4  Hip external rotation  4  Knee flexion  4  Knee extension  4+  Ankle dorsiflexion    Ankle plantarflexion    Ankle inversion    Ankle eversion     (Blank rows = not tested)   FUNCTIONAL TESTS:  Eval: 5 times sit to stand: 15.14 sec (no UE use) Timed up and go (TUG): 14.00 sec  GAIT: Distance walked: 50 feet Assistive device utilized: Single point cane Level of assistance: SBA Comments: antalgic/guarded/ slow                                                                                                                                TREATMENT DATE:    10/23/23 Nustep level 4 for 8 min PPT 2x10 in supine to increase lumbopelvic stability Glute bridges in supine 3x8  Seated marches 3x10 with 1 lb ankle weights TKE 3x10 red TB Standing heel raises using mirror therapy for external feedback on neuromuscular control 2x10 Standing heel raises with ball squeeze at ankles 2x10 to increase posterior tibialis muscle strength Leg press seat 8 2x10 70 # 2x15 Resistance band at ankle black DF/Inv/Ev 1x10 on each ankle with noted substitutions from knee only; leg extended resting on PT student's bended knee  10/20/23:Pt arrives for aquatic physical therapy. Treatment took place in 3.5-5.5 feet of water. Water temperature was 90 degrees F. Pt entered the pool via stairs with moderate use of hand rails. Pt requires buoyancy of water for support and to offload joints with strengthening exercises.  Seated water bench with 75% submersion Pt performed seated LE AROM exercises 20x in all planes,concurrent discussion of status. 75% depth water  walking 10x in each  direction with Q-tip. Standing hip 3 ways with min-mod push/pull 15x Bil. High knee marching in place holding Q-tip 2x20. Sit to stand from 3rd step 5x. Requires stabilization from hand rail, VC to get weight forward. Underwater seated decompression with noodle behind him 3 min.    10/18/23: NuStep L4 6 min, arms around 9-10 Leg press seat 8: 60# 15x Leg press seat 8: 80# 15x WB on Lt 3x10 on 6 in step WB Rt 2x10 on 6 in step Marches bilaterally 2x10 Quad set bilaterally in seated 2x10 Resistance band at ankle black DF 3x30 with noted substitutions from hip/knee Seated 4 way left ankle strengthening with green tband 1x10 each  Sit to stands 2x 5 without UE support Heel/toe raises at barre 2x10 with cuing for improved technique  Clincal impression: Mr Stickels shows up at every session ready to work hard and address noted deficits. Unfortunately he had a fall the day after going to the pool where he believes it being the sit to stands he did from the third step from the bottom; reports his knees started out lower than his usual sit to stands from a chair. Pt reports falling in his home after moving from sitting to standing from his recliner when he Rt knee buckled, causing him to fall to his left side- bruising left side of forehead and left ribs located roughly around T10-T12. Pt reports no pain in either hip, only pain in bruised ribs. Today's session was effective while still being light to meet the pt's recent occurrences. Pt tolerated all exercises well, finding them to be appropriately challenging. Pt requires tactile cueing during ankle Inv/ev exercises to not compensate using the knee. Pt will benefit to continue skilled PT to address LE muscle weakness, neuromuscular activation, and proprioceptive awareness so he is able to ambulate independently and having no falls.   PATIENT EDUCATION:  Education details: Initiated HEP Person educated: Patient Education method:  Programmer, multimedia, Facilities manager, Verbal cues, and Handouts Education comprehension: verbalized understanding and returned demonstration  HOME EXERCISE PROGRAM: Access Code: [email or web address] URL: [email or web address] Date: 10/04/2023 Prepared by: Glade Pesa  Exercises - Squat with Chair Touch  - 1 x daily - 7 x weekly - 4 sets - 5 reps - Side Stepping with Resistance at Ankles  - 1 x daily - 7 x weekly - 3 sets - 10 reps - Seated Hip Abduction with Resistance  - Keean pushes himself a lot while exercising in the water, possibly pushes himself too hard. Increased workload today by increasing reps and difficulty of each exercise.. Qtip provided less support with standing activities. Sit to stand exercise extremely difficult.     OBJECTIVE IMPAIRMENTS: Abnormal gait, cardiopulmonary status limiting activity, decreased activity tolerance, decreased balance, decreased endurance, difficulty walking, decreased ROM, decreased strength, increased fascial restrictions, increased muscle spasms, impaired flexibility, impaired UE functional use, improper body mechanics, postural dysfunction, and pain.   ACTIVITY LIMITATIONS: carrying, lifting, bending, sitting, standing, squatting, sleeping, stairs, transfers, bed mobility, bathing, toileting, dressing, reach over head, hygiene/grooming, and caring for others  PARTICIPATION LIMITATIONS: meal prep, cleaning, laundry, driving, shopping, community activity, occupation, yard work, and church  PERSONAL FACTORS: Fitness, Past/current experiences, Profession, Time since onset of injury/illness/exacerbation, and 3+ comorbidities: spine surgery, CABG, diabetes and Htn are also affecting patient's functional outcome.   REHAB POTENTIAL: Good  CLINICAL DECISION MAKING: Unstable/unpredictable  EVALUATION COMPLEXITY: High   GOALS: Goals reviewed with patient? Yes  SHORT TERM GOALS: Target date: 10/26/2023  Pain report to be no greater  than 4/10   Baseline: Goal status: Ongoing  2.  Patient will be independent with initial HEP  Baseline:  Goal status: Met on 10/18/23  3.  Patient to be able to ambulate 50 feet without a.d Baseline:  Goal status: Ongoing  4.  Patient to be able to do 10 sit to stand without UE support  Baseline:  Goal status: Ongoing    LONG TERM GOALS: Target date: 11/23/2023  Patient to report pain no greater than 2/10  Baseline:  Goal status: Progressing   2.  Patient to be independent with advanced HEP  Baseline:  Goal status: ongoing   3.  Patient to be able to stand or walk for at least 15 min without pain or need to rest Baseline:  Goal status: INITIAL  4.  Patient to be able to ascend and descend 12 steps safely Baseline:  Goal status: Ongoing   5.  Patient to report 85% improvement in overall symptoms and function Baseline:  Goal status: Ongoing  6.  Patient to be able to walk 500 feet without a.d. safely without rest breaks Baseline:  Goal status: Ongoing    PLAN:  PT FREQUENCY: 3x/week  PT DURATION: 8 weeks  PLANNED INTERVENTIONS: 97110-Therapeutic exercises, 97530- Therapeutic activity, 97112- Neuromuscular re-education, 97535- Self Care, [id number]- Manual therapy, [phone]- Gait training, [phone]- Aquatic Therapy, [phone]- Electrical stimulation (unattended), [phone]- Electrical stimulation (manual), [id number]- Vasopneumatic device, 97035- Ultrasound, 02966- Ionotophoresis 4mg /ml Dexamethasone , Patient/Family education, Balance training, Stair training, Taping, Joint mobilization, Spinal mobilization, Cryotherapy, and Moist heat  PLAN FOR NEXT SESSION: Nustep, continue functional strength training; patient has been in rehab and home health: he needs higher level functional training; could benefit from AFO if needed for foot drop and genu recurvatum depending on motor return  Lavanda Cleverly, SPT 10/23/23 2:19 PM   I agree with the following treatment note after reviewing documentation. This  session was performed under the supervision of a licensed clinician.  Jarrell Laming, PT, DPT 10/23/23, 2:20 PM   [facility] [address], [address] [place], [place] [address] Phone # [phone] Fax [phone]

## 2023-10-24 ENCOUNTER — Other Ambulatory Visit (HOSPITAL_COMMUNITY): Payer: Self-pay

## 2023-10-24 ENCOUNTER — Ambulatory Visit: Attending: Family Medicine | Admitting: Family Medicine

## 2023-10-24 ENCOUNTER — Encounter

## 2023-10-24 ENCOUNTER — Telehealth: Payer: Self-pay

## 2023-10-24 ENCOUNTER — Encounter: Payer: Self-pay | Admitting: Family Medicine

## 2023-10-24 VITALS — BP 174/90 | HR 87 | Ht 72.0 in | Wt 201.4 lb

## 2023-10-24 DIAGNOSIS — E1169 Type 2 diabetes mellitus with other specified complication: Secondary | ICD-10-CM | POA: Diagnosis not present

## 2023-10-24 DIAGNOSIS — F32A Depression, unspecified: Secondary | ICD-10-CM

## 2023-10-24 DIAGNOSIS — I4819 Other persistent atrial fibrillation: Secondary | ICD-10-CM

## 2023-10-24 DIAGNOSIS — Z794 Long term (current) use of insulin: Secondary | ICD-10-CM

## 2023-10-24 DIAGNOSIS — I5020 Unspecified systolic (congestive) heart failure: Secondary | ICD-10-CM

## 2023-10-24 DIAGNOSIS — B351 Tinea unguium: Secondary | ICD-10-CM

## 2023-10-24 DIAGNOSIS — E1159 Type 2 diabetes mellitus with other circulatory complications: Secondary | ICD-10-CM

## 2023-10-24 DIAGNOSIS — F419 Anxiety disorder, unspecified: Secondary | ICD-10-CM

## 2023-10-24 DIAGNOSIS — I152 Hypertension secondary to endocrine disorders: Secondary | ICD-10-CM

## 2023-10-24 DIAGNOSIS — I480 Paroxysmal atrial fibrillation: Secondary | ICD-10-CM

## 2023-10-24 DIAGNOSIS — M546 Pain in thoracic spine: Secondary | ICD-10-CM

## 2023-10-24 LAB — POCT GLYCOSYLATED HEMOGLOBIN (HGB A1C): HbA1c, POC (controlled diabetic range): 7.9 % — AB (ref 0.0–7.0)

## 2023-10-24 MED ORDER — FUROSEMIDE 40 MG PO TABS
40.0000 mg | ORAL_TABLET | Freq: Two times a day (BID) | ORAL | 1 refills | Status: DC
  Filled 2023-10-24 – 2024-02-07 (×4): qty 180, 90d supply, fill #0

## 2023-10-24 MED ORDER — TAMSULOSIN HCL 0.4 MG PO CAPS
0.4000 mg | ORAL_CAPSULE | Freq: Every day | ORAL | 1 refills | Status: AC
  Filled 2023-10-24 – 2024-01-10 (×3): qty 90, 90d supply, fill #0
  Filled 2024-04-18: qty 90, 90d supply, fill #1

## 2023-10-24 MED ORDER — METHOCARBAMOL 750 MG PO TABS
750.0000 mg | ORAL_TABLET | Freq: Three times a day (TID) | ORAL | 1 refills | Status: AC | PRN
  Filled 2023-10-24: qty 90, 30d supply, fill #0
  Filled 2024-02-01: qty 90, 30d supply, fill #1
  Filled 2024-02-05: qty 90, 30d supply, fill #0

## 2023-10-24 NOTE — Telephone Encounter (Signed)
 At the request of Dr Delbert, I met with the patient when he was in the clinic today for his appointment.   He was teary and explained that he needs some counseling.  He spoke about his multiple medical issues and how he is not able to work, his wife is working and supporting them financially.  He receives disability and food stamps but that is not always enough.   He said they were evicted from their home and stayed with his cousin for 6 months and now they are in their own apartment and are able to pay their bills.   He is frustrated that he is not able to work and he was very active and now he is learning how to walk again.. he said he is learning to walk for the 3rd time. He is currently ambulating with a cane and goes to PT 3x/week and said he has 8 more weeks of PT but if he needs more, they will request more sessions from the insurance company.  He is upset that he is not able to do anything he used to do.  However he is able to drive. 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 He noted that his employer said that he can have his job back and they are able to make accommodations for him, even if he is just chopping vegetables.    His mobility limits his ability to help with housekeeping, grocery shopping and he feels bad that all of the responsibilities fall on his wife.    He acknowledges that he has made progress with his rehab but still feels the loss of the person he once was.  He denied and suicidal/homicidal thoughts and said he would never do that.   He said he came to the appointment today because he needs to talk to someone and is very open to counseling. He said his wife has encouraged him to seek counseling.  He went on to say that he doesn't want to take any medications and is in agreement to a referral to [facility].  I explained that we have [facility] and they have walk in hours if he feels he needs urgent behavioral health assistance and he denied that need this  time.   I also encouraged him to call me or send me a MyChart message with any questions/ concerns and he said he understood

## 2023-10-24 NOTE — Patient Instructions (Signed)
 Semaglutide Injection What is this medication? SEMAGLUTIDE (SEM a GLOO tide) treats type 2 diabetes. It works by increasing insulin levels in your body, which decreases your blood sugar (glucose). It also reduces the amount of sugar released into your blood and slows down your digestion. It may also be used to lower the risk of worsening disease and death caused by heart or kidney disease in people with type 2 diabetes. Changes to diet and exercise are often combined with this medication. This medicine may be used for other purposes; ask your health care provider or pharmacist if you have questions. COMMON BRAND NAME(S): OZEMPIC What should I tell my care team before I take this medication? They need to know if you have any of these conditions: Eye disease caused by diabetes Gallbladder disease Have or have had pancreatitis Having surgery Kidney disease Personal or family history of MEN 2, a condition that causes endocrine gland tumors Personal or family history of thyroid  cancer Stomach or intestine problems, such as problems digesting food An unusual or allergic reaction to semaglutide, other medications, foods, dyes, or preservatives Pregnant or trying to get pregnant Breastfeeding How should I use this medication? This medication is for injection under the skin of your upper leg (thigh), stomach area, or upper arm. It is given once every week (every 7 days). You will be taught how to prepare and give this medication. Use exactly as directed. Take your medication at regular intervals. Do not take it more often than directed. If you use this medication with insulin, you should inject this medication and the insulin separately. Do not mix them together. Do not give the injections right next to each other. Change (rotate) injection sites with each injection. It is important that you put your used needles and syringes in a special sharps container. Do not put them in a trash can. If you do not  have a sharps container, call your pharmacist or care team to get one. A special MedGuide will be given to you by the pharmacist with each prescription and refill. Be sure to read this information carefully each time. This medication comes with INSTRUCTIONS FOR USE. Ask your pharmacist for directions on how to use this medication. Read the information carefully. Talk to your pharmacist or care team if you have questions. Talk to your care team about the use of this medication in children. Special care may be needed. Overdosage: If you think you have taken too much of this medicine contact a poison control center or emergency room at once. NOTE: This medicine is only for you. Do not share this medicine with others. What if I miss a dose? If you miss a dose, take it as soon as you can within 5 days after the missed dose. Then take your next dose at your regular weekly time. If it has been longer than 5 days after the missed dose, do not take the missed dose. Take the next dose at your regular time. Do not take double or extra doses. If you have questions about a missed dose, contact your care team for advice. What may interact with this medication? Some medications may affect your blood sugar levels or hide the symptoms of low blood sugar (hypoglycemia). Talk with your care team about all the medications you take. They may suggest changes to your insulin dose or checking your blood sugar levels more often. Medications that may affect your blood sugar levels include: Alcohol Certain antibiotics, such as ciprofloxacin, levofloxacin, sulfamethoxazole; trimethoprim Certain  medications for blood pressure or heart disease, such as benazepril, enalapril, lisinopril, losartan, valsartan Certain medications for mental health conditions, such as fluoxetine or olanzapine Diuretics, such as hydrochlorothiazide (HCTZ) Estrogen and progestin hormones Other medications for diabetes Steroid medications, such as  prednisone or cortisone Testosterone Thyroid  hormones Medications that may mask symptoms of low blood sugar include: Beta blockers, such as atenolol, metoprolol, propranolol Clonidine Guanethidine Reserpine This list may not describe all possible interactions. Give your health care provider a list of all the medicines, herbs, non-prescription drugs, or dietary supplements you use. Also tell them if you smoke, drink alcohol, or use illegal drugs. Some items may interact with your medicine. What should I watch for while using this medication? Visit your care team for regular checks on your progress. Tell your care team if your symptoms do not start to get better or if they get worse. You may need blood work done while you are taking this medication. Your care team will monitor your HbA1C (A1C). This test shows what your average blood sugar (glucose) level was over the past 2 to 3 months. Know the symptoms of low blood sugar and know how to treat it. Always carry a source of quick sugar with you. Examples include hard sugar candy or glucose tablets. Make sure others know that you can choke if you eat or drink if your blood sugar is too low and you are unable to care for yourself. Get medical help at once. Tell your care team if you have high blood sugar. Your medication dose may change if your body is under stress. Some types of stress that may affect your blood sugar include fever, infection, and surgery. Do not share pens or cartridges with anyone, even if the needle is changed. Each pen should only be used by one person. Sharing could cause an infection. Wear a medical ID bracelet or chain. Carry a card that describes your condition. List the medications and doses you take on the card. Talk to your care team about your risk of cancer. You may be more at risk for certain types of cancer if you take this medication. Talk to your care team right away if you have a lump or swelling in your neck,  hoarseness that does not go away, trouble swallowing, shortness of breath, or trouble breathing. Make sure you stay hydrated while taking this medication. Drink water often. Eat fruits and veggies that have a high water content. Drink more water when it is hot or you are active. Talk to your care team right away if you have fever, infection, vomiting, diarrhea, or if you sweat a lot while taking this medication. The loss of too much body fluid may make it dangerous for you to take this medication. If you are going to need surgery or a procedure, tell your care team that you are taking this medication. Do not take this medication without first talking to your care team if you may be or could become pregnant. Your care team can help you find the option that works for you. Weight loss is not recommended during pregnancy. Maintaining healthy blood sugar levels can help reduce the risk of pregnancy complications. Talk to your care team if you are breastfeeding. When recommended, this medication may be taken. Its use during breastfeeding has not been well studied. Lactation may help lower your blood sugar levels. Your care team may recommend changes to your treatment plan. What side effects may I notice from receiving this medication? Side  effects that you should report to your care team as soon as possible: Allergic reactions--skin rash, itching, hives, swelling of the face, lips, tongue, or throat Change in vision Dehydration--increased thirst, dry mouth, feeling faint or lightheaded, headache, dark yellow or brown urine Fast or irregular heartbeat Gallbladder problems--severe stomach pain, nausea, vomiting, fever Kidney injury--decrease in the amount of urine, swelling of the ankles, hands, or feet Pancreatitis--severe stomach pain that spreads to your back or gets worse after eating or when touched, fever, nausea, vomiting Thyroid  cancer--new mass or lump in the neck, pain or trouble swallowing, trouble  breathing, hoarseness Side effects that usually do not require medical attention (report these to your care team if they continue or are bothersome): Constipation Diarrhea Loss of appetite Nausea Upset stomach This list may not describe all possible side effects. Call your doctor for medical advice about side effects. You may report side effects to FDA at 1-800-FDA-1088. Where should I keep my medication? Keep out of the reach of children. Store unopened pens in a refrigerator between 2 and 8 degrees C (36 and 46 degrees F). Do not freeze. Protect from light and heat. After you first use the pen, it can be stored for 56 days at room temperature between 15 and 30 degrees C (59 and 86 degrees F) or in a refrigerator. Throw away your used pen after 56 days or after the expiration date, whichever comes first. Do not store your pen with the needle attached. If the needle is left on, medication may leak from the pen. NOTE: This sheet is a summary. It may not cover all possible information. If you have questions about this medicine, talk to your doctor, pharmacist, or health care provider.  2025 [employer] (2023-05-09 00:00:00)

## 2023-10-24 NOTE — Progress Notes (Signed)
 "  Subjective:  Patient ID: David [name], male    DOB: September 04, 1960  Age: 63 y.o. MRN: [id number]  CC: Medical Management of Chronic Issues (Nail fungus/Weight concern)     Discussed the use of AI scribe software for clinical note transcription with the patient, who gave verbal consent to proceed.  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  He is in ongoing recovery from left hip surgery, attending physical therapy three times a week with eight weeks remaining. He uses a cane and aims to walk without it. He experienced a recent fall due to dizziness upon standing too quickly, resulting in left-sided bruises and rib cage pain.  He has gained 25 pounds, now weighing 200 pounds, due to limited activity and dietary habits. His A1c has increased from 7.4 in January to 7.9. He takes 27 units of insulin  daily for diabetes management but does not use his Humalog .  He is also on Farxiga  for diabetes management.  He feels depressed and has been unable to refill Xanax , which he used for anxiety and sleep issues as needed.  He received this at discharge about 10 tablets.  He was previously independent and had a full-time job but has been unable to work for 1 year which has limited his income even though he receives soft tissue coverage he benefits and his wife works part-time.  He experiences muscle cramps after physical therapy, relieved by methocarbamol . He has increased his water intake recently.  He takes several medications, including Eliquis , Coreg , Farxiga , Repatha ,  Lasix , Lyrica , rosuvastatin , and tamsulosin . He reports a recent issue with Lasix  refills, leading to leg swelling. Currently under the care of cardiology for management of his CAD.  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 The nails are thick and discolored, with slow growth.    Past Medical History:  Diagnosis Date   Cardiomyopathy (HCC)    a. EF 45% in 2019.   CHF (congestive heart failure) (HCC)    Chronic anticoagulation 09/30/2017   Diabetes mellitus type 2 in nonobese [facility])    Discitis of thoracic region 09/25/2022   Does not have health insurance    Essential hypertension 09/26/2017   Financial difficulties    H/O noncompliance with medical treatment, presenting hazards to health    Hardware complicating wound infection (HCC) 09/25/2022   Non-insulin  treated type 2 diabetes mellitus (HCC) 09/26/2017   Persistent atrial fibrillation (HCC)    Sleep apnea suspected 09/30/2017   years ago- not anymore per patient    Past Surgical History:  Procedure Laterality Date   ABDOMINAL AORTOGRAM W/LOWER EXTREMITY N/A 09/02/2021   Procedure: ABDOMINAL AORTOGRAM W/LOWER EXTREMITY;  Surgeon: Serene Gaile ORN, MD;  [facility]: [facility];  Service: Cardiovascular;  Laterality: N/A;   ABDOMINAL AORTOGRAM W/LOWER EXTREMITY N/A 12/28/2021   Procedure: ABDOMINAL AORTOGRAM W/LOWER EXTREMITY;  Surgeon: Serene Gaile ORN, MD;  Location: [facility];  Service: Cardiovascular;  Laterality: N/A;   ABDOMINAL AORTOGRAM W/LOWER EXTREMITY N/A 07/28/2022  Procedure: ABDOMINAL AORTOGRAM W/LOWER EXTREMITY;  Surgeon: Gretta Lonni PARAS, MD;  Location: [facility] INVASIVE CV LAB;  Service: Cardiovascular;  Laterality: N/A;   AMPUTATION Right 09/04/2021   Procedure: 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;  Surgeon: Harden Jerona GAILS, MD;  Location: [facility] OR;  Service: Orthopedics;  Laterality: Right;   AMPUTATION Right  08/02/2022   Procedure: AMPUTATION RAY 5TH METATARSAL OF RIGHT FOOT;  Surgeon: Barton Drape, MD;  Location: [facility] OR;  Service: Orthopedics;  Laterality: Right;   APPENDECTOMY  1971   BONE BIOPSY Right 08/02/2022   Procedure: BONE BIOPSY OF 5TH METATARSAL RIGHT FOOT;  Surgeon: Barton Drape, MD;  Location: [facility] OR;  Service: Orthopedics;  Laterality: Right;   CARDIOVERSION N/A 09/28/2017   Procedure: CARDIOVERSION;  Surgeon: Francyne Headland, MD;  Location: [facility]OS[facility];  Service: Cardiovascular;  Laterality: N/A;   CLIPPING OF ATRIAL APPENDAGE N/A 04/18/2023   Procedure: CLIPPING OF ATRIAL APPENDAGE USING ATRICURE ATRICLIP [id number] with pulmonary vein isolation ablation;  Surgeon: Lucas Dorise POUR, MD;  Location: [facility] OR;  Service: Open Heart Surgery;  Laterality: N/A;   CORONARY ARTERY BYPASS GRAFT N/A 04/18/2023   Procedure: CORONARY ARTERY BYPASS GRAFTING (CABG) TIMES FOUR USING LEFT INTERNAL MAMMARY ARTERY AND ENDOSCOPICALLY HARVESTED LEFT GREATER SAPHENOUS VEIN;  Surgeon: Lucas Dorise POUR, MD;  Location: [facility] OR;  Service: Open Heart Surgery;  Laterality: N/A;   IRRIGATION AND DEBRIDEMENT FOOT  08/02/2022   Procedure: IRRIGATION AND DEBRIDEMENT RIGHT 5TH METATARSAL FOOT;  Surgeon: Barton Drape, MD;  Location: [facility];  Service: Orthopedics;;   LAMINECTOMY WITH POSTERIOR LATERAL ARTHRODESIS LEVEL 4 N/A 08/30/2022   Procedure: THORACIC FIVE-THORACIC ELEVEN FUSION, THORACIC EIGHT TRANSPEDICULAR DECOMPRESSION AND PARTIAL CORPECTOMY with O-Arm;  Surgeon: Debby Dorn MATSU, MD;  Location: [facility] OR;  Service: Neurosurgery;  Laterality: N/A;   LEFT HEART CATH AND CORONARY ANGIOGRAPHY N/A 04/12/2023   Procedure: LEFT HEART CATH AND CORONARY ANGIOGRAPHY;  Surgeon: Verlin Lonni BIRCH, MD;  [facility]: [facility];  Service: Cardiovascular;  Laterality: N/A;   NASAL ENDOSCOPY Right 06/30/2023   Procedure: ENDOSCOPY, NOSE;  Surgeon: Jesus Oliphant, MD;  Location[facility] OR;  Service: ENT;  Laterality:  Right;   PERIPHERAL VASCULAR BALLOON ANGIOPLASTY  09/02/2021   Procedure: PERIPHERAL VASCULAR BALLOON ANGIOPLASTY;  Surgeon: Serene Gaile ORN, MD;  [facility];  Service: Cardiovascular;;   PERIPHERAL VASCULAR BALLOON ANGIOPLASTY  12/28/2021   Procedure: PERIPHERAL VASCULAR BALLOON ANGIOPLASTY;  Surgeon: Serene Gaile ORN, MD;  [facility];  Service: Cardiovascular;;  Posterior Tibial PTA only   POLYPECTOMY Right 06/30/2023   Procedure: POLYPECTOMY, NASAL CAVITY;  Surgeon: Jesus Oliphant, MD;  Location: [facility] OR;  Service: ENT;  Laterality: Right;   TEE WITHOUT CARDIOVERSION N/A 09/28/2017   Procedure: TRANSESOPHAGEAL ECHOCARDIOGRAM (TEE);  Surgeon: Francyne Headland, MD;  Location: [facility] ENDOSCOPY;  Service: Cardiovascular;  Laterality: N/A;   TEE WITHOUT CARDIOVERSION N/A 09/03/2021   Procedure: TRANSESOPHAGEAL ECHOCARDIOGRAM (TEE);  Surgeon: Raford Riggs, MD;  Location: [facility] ENDOSCOPY;  Service: Cardiovascular;  Laterality: N/A;   TEE WITHOUT CARDIOVERSION N/A 07/15/2022   Procedure: TRANSESOPHAGEAL ECHOCARDIOGRAM;  Surgeon: Francyne Headland, MD;  [facility];  Service: Cardiovascular;  Laterality: N/A;   TEE WITHOUT CARDIOVERSION N/A 04/18/2023   Procedure: TRANSESOPHAGEAL ECHOCARDIOGRAM (TEE);  Surgeon: Lucas Dorise POUR, MD;  Location: [facility] OR;  Service: Open Heart Surgery;  Laterality: N/A;   TOTAL HIP ARTHROPLASTY Left 07/14/2023   Procedure: ARTHROPLASTY, HIP, TOTAL, ANTERIOR APPROACH;  Surgeon: Kendal Franky SQUIBB, MD;  Location:  [facility] OR;  Service: Orthopedics;  Laterality: Left;    Family History  Problem Relation Age of Onset   Hypertension Mother     Social History   Socioeconomic History   Marital status: Married    Spouse name: Not on file   Number of children: Not on file   Years of education: Not on file   Highest education level: Some college, no degree  Occupational History   Not on file  Tobacco Use   Smoking status: Former     Current packs/day: 0.00    Types: Cigarettes    Start date: 03/19/2003    Quit date: 03/18/2018    Years since quitting: 5.6   Smokeless tobacco: Never   Tobacco comments:    09/26/2017 2-3 cigarettes/month now  Vaping Use   Vaping status: Never Used  Substance and Sexual Activity   Alcohol use: Not Currently    Alcohol/week: 3.0 standard drinks of alcohol    Types: 3 Cans of beer per week    Comment: States he quit drinking 8-9 months ago   Drug use: Never   Sexual activity: Not Currently  Other Topics Concern   Not on file  Social History Narrative   Not on file   Social Drivers of Health   Financial Resource Strain: Medium Risk (10/22/2023)   Overall Financial Resource Strain (CARDIA)    Difficulty of Paying Living Expenses: Somewhat hard  Food Insecurity: No Food Insecurity (10/22/2023)   Hunger Vital Sign    Worried About Running Out of Food in the Last Year: Never true    Ran Out of Food in the Last Year: Never true  Transportation Needs: No Transportation Needs (10/22/2023)   PRAPARE - Administrator, Civil Service (Medical): No    Lack of Transportation (Non-Medical): No  Physical Activity: Sufficiently Active (10/22/2023)   Exercise Vital Sign    Days of Exercise per Week: 3 days    Minutes of Exercise per Session: 50 min  Stress: Stress Concern Present (10/22/2023)   [employer] of Occupational Health - Occupational Stress Questionnaire    Feeling of Stress: To some extent  Social Connections: Unknown (10/22/2023)   Social Connection and Isolation Panel    Frequency of Communication with Friends and Family: Once a week    Frequency of Social Gatherings with Friends and Family: Patient declined    Attends Religious Services: Never    Database Administrator or Organizations: No    Attends Engineer, Structural: Not on file    Marital Status: Married    No Known Allergies  Outpatient Medications Prior to Visit  Medication Sig Dispense  Refill   ALPRAZolam  (XANAX ) 0.5 MG tablet Take 1 tablet (0.5 mg total) by mouth at bedtime as needed for anxiety or sleep. 10 tablet 0   apixaban  (ELIQUIS ) 5 MG TABS tablet Take 1 tablet (5 mg total) by mouth 2 (two) times daily. 180 tablet 1   carvedilol  (COREG ) 6.25 MG tablet Take 1 tablet (6.25 mg total) by mouth 2 (two) times daily. 180 tablet 3   Continuous Glucose Sensor (DEXCOM G7 SENSOR) MISC Use as directed & change every 10 days. 3 each 12   dapagliflozin  propanediol (FARXIGA ) 10 MG TABS tablet Take 1 tablet (10 mg total) by mouth daily before breakfast. 90 tablet 1   docusate sodium  (COLACE) 100 MG capsule Take 2 capsules (200 mg total) by mouth 2 (two) times daily.     doxycycline  (VIBRA -TABS) 100  MG tablet Take 1 tablet (100 mg total) by mouth every 12 (twelve) hours. 180 tablet 3   Evolocumab  (REPATHA  SURECLICK) 140 MG/ML SOAJ Inject 140 mg into the skin every 14 (fourteen) days. 6 mL 3   famotidine  (PEPCID ) 20 MG tablet Take 1 tablet (20 mg total) by mouth 2 times daily. 60 tablet 1   furosemide  (LASIX ) 40 MG tablet Take 1 tablet (40 mg total) by mouth 2 (two) times daily. 60 tablet 0   HYDROcodone -acetaminophen  (NORCO) 10-325 MG tablet Take 1 tablet by mouth every 4 (four) hours as needed for moderate pain (pain score 4-6). 30 tablet 0   icosapent  Ethyl (VASCEPA ) 1 g capsule Take 2 capsules (2 g total) by mouth 2 (two) times daily. 120 capsule 11   insulin  glargine (LANTUS  SOLOSTAR) 100 UNIT/ML Solostar Pen Inject 18 Units into the skin daily.     insulin  glargine (LANTUS  SOLOSTAR) 100 UNIT/ML Solostar Pen Inject 27 Units into the skin daily. 30 mL 6   Insulin  Pen Needle (PEN NEEDLES) 32G X 4 MM MISC Use to inject insulin  once daily. 100 each 6   methocarbamol  (ROBAXIN ) 750 MG tablet Take 1 tablet (750 mg total) by mouth every 8 (eight) hours as needed for muscle spasms. (Patient taking differently: Take 750 mg by mouth 2 (two) times daily as needed for muscle spasms.)     naloxone   (NARCAN ) nasal spray 4 mg/0.1 mL Opiates overdose (Patient taking differently: Place 1 spray into the nose once. Opiates overdose) 2 each 0   OXYCONTIN  10 MG 12 hr tablet Take 1 tablet (10 mg total) by mouth every 12 (twelve) hours. 14 tablet 0   polyethylene glycol (MIRALAX  / GLYCOLAX ) 17 g packet Mix 1 packet (17 g) in beverage and take by mouth 2 (two) times daily. (Patient taking differently: Take 17 g by mouth every other day.) 100 each 3   Potassium Chloride  ER 20 MEQ TBCR Take 1 tablet (20 mEq total) by mouth daily. 60 tablet 1   pregabalin  (LYRICA ) 75 MG capsule Take 1 capsule (75 mg total) by mouth 2 (two) times daily. 60 capsule 2   rosuvastatin  (CRESTOR ) 20 MG tablet Take 1 tablet (20 mg total) by mouth daily. 90 tablet 3   tamsulosin  (FLOMAX ) 0.4 MG CAPS capsule Take 1 capsule (0.4 mg total) by mouth daily after supper. 90 capsule 1   terbinafine  (LAMISIL ) 250 MG tablet Take 1 tablet (250 mg total) by mouth daily. 30 tablet 2   Vitamin D , Ergocalciferol , (DRISDOL ) 1.25 MG (50000 UNIT) CAPS capsule Take 1 capsule (50,000 Units total) by mouth every 7 (seven) days. 8 capsule 0   amiodarone  (PACERONE ) 200 MG tablet Take 1 tablet (200 mg total) by mouth daily for 10 days.     insulin  aspart (NOVOLOG ) 100 UNIT/ML injection Inject 0-15 Units into the skin 3 (three) times daily with meals. insulin  aspart (novoLOG ) injection 0-15 Units  0-15 Units, Subcutaneous, 3 times daily with meals, First dose on Tue 07/18/23 at 1200 Correction coverage: Moderate (average weight, post-op) CBG < 70: Implement Hypoglycemia Standing Orders and refer to Hypoglycemia Standing Orders sidebar report CBG 70 - 120: 0 units CBG 121 - 150: 2 units CBG 151 - 200: 3 units CBG 201 - 250: 5 units CBG 251 - 300: 8 units CBG 301 - 350: 11 units CBG 351 - 400: 15 units     No facility-administered medications prior to visit.     ROS Review of Systems  Constitutional:  Negative  for activity change and appetite change.   HENT:  Negative for sinus pressure and sore throat.   Respiratory:  Negative for chest tightness, shortness of breath and wheezing.   Cardiovascular:  Negative for chest pain and palpitations.  Gastrointestinal:  Negative for abdominal distention, abdominal pain and constipation.  Genitourinary: Negative.   Musculoskeletal:        See HPI  Psychiatric/Behavioral:  Negative for behavioral problems and dysphoric mood.     Objective:  BP (!) 159/98   Pulse 87   Ht 6' (1.829 m)   Wt 201 lb 6.4 oz (91.4 kg)   SpO2 96%   BMI 27.31 kg/m      10/24/2023    3:26 PM 09/25/2023    1:58 PM 07/21/2023    2:47 AM  BP/Weight  Systolic BP 159 104 131  Diastolic BP 98 70 73  Wt. (Lbs) 201.4 189.6   BMI 27.31 kg/m2 25.71 kg/m2       Physical Exam Constitutional:      Appearance: He is well-developed.  Cardiovascular:     Rate and Rhythm: Normal rate.     Heart sounds: Normal heart sounds. No murmur heard. Pulmonary:     Effort: Pulmonary effort is normal.     Breath sounds: Normal breath sounds. No wheezing or rales.  Chest:     Chest wall: No tenderness.  Abdominal:     General: Bowel sounds are normal. There is no distension.     Palpations: Abdomen is soft. There is no mass.     Tenderness: There is no abdominal tenderness.  Musculoskeletal:     Right lower leg: No edema.     Left lower leg: No edema.  Skin:    Comments: Thickened fingernails and right hand of thumb, index, middle fingers and left hand all fingers except fifth finger nail Bruises on left thorax  Neurological:     Mental Status: He is alert and oriented to person, place, and time.     Gait: Gait abnormal.  Psychiatric:     Comments: Dysphoric mood        Latest Ref Rng & Units 07/21/2023    4:48 AM 07/20/2023    4:23 AM 07/19/2023    4:18 AM  CMP  Glucose 70 - 99 mg/dL 839  863  842   BUN 8 - 23 mg/dL 25  28  32   Creatinine 0.61 - 1.24 mg/dL 8.48  8.54  8.31   Sodium 135 - 145 mmol/L 134  134  135    Potassium 3.5 - 5.1 mmol/L 4.0  4.3  4.8   Chloride 98 - 111 mmol/L 102  101  104   CO2 22 - 32 mmol/L 20  21  24    Calcium  8.9 - 10.3 mg/dL 8.9  8.6  8.8     Lipid Panel     Component Value Date/Time   CHOL 199 01/09/2023 0841   TRIG 235 (H) 01/09/2023 0841   HDL 36 (L) 01/09/2023 0841   CHOLHDL 5.9 (H) 12/14/2021 1025   CHOLHDL 6.6 09/03/2021 0314   VLDL 50 (H) 09/03/2021 0314   LDLCALC 121 (H) 01/09/2023 0841    CBC    Component Value Date/Time   WBC 8.5 07/21/2023 0448   RBC 3.10 (L) 07/21/2023 0448   HGB 9.4 (L) 07/21/2023 0448   HGB 17.3 06/16/2021 1658   HCT 27.8 (L) 07/21/2023 0448   HCT 47.0 06/16/2021 1658   PLT 180 07/21/2023 0448  PLT 244 06/16/2021 1658   MCV 89.7 07/21/2023 0448   MCV 91 06/16/2021 1658   MCH 30.3 07/21/2023 0448   MCHC 33.8 07/21/2023 0448   RDW 14.6 07/21/2023 0448   RDW 12.7 06/16/2021 1658   LYMPHSABS 1.0 07/12/2023 2105   LYMPHSABS 1.7 11/16/2018 1128   MONOABS 0.8 07/12/2023 2105   EOSABS 0.2 07/12/2023 2105   EOSABS 0.4 11/16/2018 1128   BASOSABS 0.1 07/12/2023 2105   BASOSABS 0.1 11/16/2018 1128    Lab Results  Component Value Date   HGBA1C 7.9 (A) 10/24/2023    Lab Results  Component Value Date   HGBA1C 7.9 (A) 10/24/2023   HGBA1C 7.4 (H) 04/09/2023   HGBA1C 7.1 (A) 03/01/2023      1. Type 2 diabetes mellitus with other specified complication, with long-term current use of insulin  (HCC) (Primary) Suboptimally controlled with A1c of 7.9, goal is less than 7.0 Advised to restart Humalog  as he has not been using this Discussed commencing Ozempic which will be beneficial from a weight, cardiac, renal standpoint however he is undecided about this and would like to think about this. Continue Farxiga  - POCT glycosylated hemoglobin (Hb A1C)  2. 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  Dispense: 180 tablet; Refill: 1  3. Thoracic back pain, unspecified back pain laterality, unspecified chronicity Previous history of osteomyelitis of the spine with recent exacerbation by fall Continue to follow-up with pain management He does have a muscle relaxant-Robaxin   4. AF (paroxysmal atrial fibrillation) (HCC) Continue anticoagulation with Eliquis  and rate control with beta-blocker   5. Tinea unguium Tried several courses of terbinafine  with no improvement - Ambulatory referral to Dermatology  6. Hypertension associated with diabetes (HCC) Uncontrolled Repeat blood pressure still elevated Patient was eager to leave and left before antihypertensive regimen could be adjusted Counseled on blood pressure goal of less than 130/80, low-sodium, DASH diet, medication compliance, 150 minutes of moderate intensity exercise per week. Discussed medication compliance, adverse effects.   8. Anxiety and depression Secondary to underlying medical conditions and inability to work Advised that we do not prescribe benzodiazepines due to tendency for dependence and tolerance In the past he did not do well on Cymbalta  He declines hydroxyzine  as he states it was ineffective in the past Will refer to [facility] for counseling Seen by case management to discuss available community resources   No orders of the defined types were placed in this encounter.   Follow-up: Return in about 6 months (around 04/25/2024) for Chronic medical conditions.       Corrina Sabin, MD, FAAFP. [facility] [place], [place] [phone]   10/24/2023, 3:38 PM "

## 2023-10-25 ENCOUNTER — Ambulatory Visit: Admitting: Rehabilitative and Restorative Service Providers"

## 2023-10-25 ENCOUNTER — Encounter: Payer: Self-pay | Admitting: Rehabilitative and Restorative Service Providers"

## 2023-10-25 ENCOUNTER — Telehealth: Payer: Self-pay | Admitting: *Deleted

## 2023-10-25 ENCOUNTER — Other Ambulatory Visit (HOSPITAL_COMMUNITY): Payer: Self-pay

## 2023-10-25 ENCOUNTER — Other Ambulatory Visit: Payer: Self-pay

## 2023-10-25 DIAGNOSIS — R293 Abnormal posture: Secondary | ICD-10-CM

## 2023-10-25 DIAGNOSIS — M6281 Muscle weakness (generalized): Secondary | ICD-10-CM

## 2023-10-25 DIAGNOSIS — M25552 Pain in left hip: Secondary | ICD-10-CM | POA: Diagnosis not present

## 2023-10-25 DIAGNOSIS — R252 Cramp and spasm: Secondary | ICD-10-CM

## 2023-10-25 DIAGNOSIS — R2681 Unsteadiness on feet: Secondary | ICD-10-CM

## 2023-10-25 DIAGNOSIS — R262 Difficulty in walking, not elsewhere classified: Secondary | ICD-10-CM

## 2023-10-25 DIAGNOSIS — R2689 Other abnormalities of gait and mobility: Secondary | ICD-10-CM

## 2023-10-25 DIAGNOSIS — M25652 Stiffness of left hip, not elsewhere classified: Secondary | ICD-10-CM

## 2023-10-25 NOTE — Therapy (Signed)
 OUTPATIENT PHYSICAL THERAPY TREATMENT NOTE   Patient Name: David [name] MRN: [id number] DOB:[birth year], 63 y.o., male Today's Date: 10/25/2023  END OF SESSION:  PT End of Session - 10/25/23 1319     Visit Number 7    Date for PT Re-Evaluation 11/23/23    Authorization Type Medicaid no auth 27 visits    PT Start Time 1225    PT Stop Time 1312    PT Time Calculation (min) 47 min    Activity Tolerance Patient tolerated treatment well    Behavior During Therapy WFL for tasks assessed/performed               Past Medical History:  Diagnosis Date   Cardiomyopathy (HCC)    a. EF 45% in 2019.   CHF (congestive heart failure) (HCC)    Chronic anticoagulation 09/30/2017   Diabetes mellitus type 2 in nonobese [facility]    Discitis of thoracic region 09/25/2022   Does not have health insurance    Essential hypertension 09/26/2017   Financial difficulties    H/O noncompliance with medical treatment, presenting hazards to health    Hardware complicating wound infection (HCC) 09/25/2022   Non-insulin  treated type 2 diabetes mellitus (HCC) 09/26/2017   Persistent atrial fibrillation (HCC)    Sleep apnea suspected 09/30/2017   years ago- not anymore per patient   Past Surgical History:  Procedure Laterality Date   ABDOMINAL AORTOGRAM W/LOWER EXTREMITY N/A 09/02/2021   Procedure: ABDOMINAL AORTOGRAM W/LOWER EXTREMITY;  Surgeon: Serene Gaile ORN, MD;  [facility];  Service: Cardiovascular;  Laterality: N/A;   ABDOMINAL AORTOGRAM W/LOWER EXTREMITY N/A 12/28/2021   Procedure: ABDOMINAL AORTOGRAM W/LOWER EXTREMITY;  Surgeon: Serene Gaile ORN, MD;  [facility];  Service: Cardiovascular;  Laterality: N/A;   ABDOMINAL AORTOGRAM W/LOWER EXTREMITY N/A 07/28/2022   Procedure: ABDOMINAL AORTOGRAM W/LOWER EXTREMITY;  Surgeon: Gretta Lonni PARAS, MD;  [facility];  Service: Cardiovascular;  Laterality: N/A;   AMPUTATION Right 09/04/2021    Procedure: 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;  Surgeon: Harden Jerona GAILS, MD;  Location: [facility] OR;  Service: Orthopedics;  Laterality: Right;   AMPUTATION Right 08/02/2022   Procedure: AMPUTATION RAY 5TH METATARSAL OF RIGHT FOOT;  Surgeon: Barton Drape, MD;  [facility];  Service: Orthopedics;  Laterality: Right;   APPENDECTOMY  1971   BONE BIOPSY Right 08/02/2022   Procedure: BONE BIOPSY OF 5TH METATARSAL RIGHT FOOT;  Surgeon: Barton Drape, MD;  [facility];  Service: Orthopedics;  Laterality: Right;   CARDIOVERSION N/A 09/28/2017   Procedure: CARDIOVERSION;  Surgeon: Francyne Headland, MD;  [facility];  Service: Cardiovascular;  Laterality: N/A;   CLIPPING OF ATRIAL APPENDAGE N/A 04/18/2023   Procedure: CLIPPING OF ATRIAL APPENDAGE USING ATRICURE ATRICLIP [id number] with pulmonary vein isolation ablation;  Surgeon: Lucas Dorise POUR, MD;  [facility];  Service: Open Heart Surgery;  Laterality: N/A;   CORONARY ARTERY BYPASS GRAFT N/A 04/18/2023   Procedure: CORONARY ARTERY BYPASS GRAFTING (CABG) TIMES FOUR USING LEFT INTERNAL MAMMARY ARTERY AND ENDOSCOPICALLY HARVESTED LEFT GREATER SAPHENOUS VEIN;  Surgeon: Lucas Dorise POUR, MD;  [facility];  Service: Open Heart Surgery;  Laterality: N/A;   IRRIGATION AND DEBRIDEMENT FOOT  08/02/2022   Procedure: IRRIGATION AND DEBRIDEMENT RIGHT 5TH METATARSAL FOOT;  Surgeon: Barton Drape, MD;  [facility];  Service: Orthopedics;;   LAMINECTOMY WITH POSTERIOR LATERAL ARTHRODESIS LEVEL 4 N/A 08/30/2022   Procedure: THORACIC FIVE-THORACIC ELEVEN FUSION, THORACIC EIGHT TRANSPEDICULAR  DECOMPRESSION AND PARTIAL CORPECTOMY with O-Arm;  Surgeon: Debby Dorn MATSU, MD;  Location: [facility] OR;  Service: Neurosurgery;  Laterality: N/A;   LEFT HEART CATH AND CORONARY ANGIOGRAPHY N/A 04/12/2023   Procedure: LEFT HEART CATH AND CORONARY ANGIOGRAPHY;  Surgeon: Verlin Lonni BIRCH, MD;  [facility];  Service: Cardiovascular;  Laterality:  N/A;   NASAL ENDOSCOPY Right 06/30/2023   Procedure: ENDOSCOPY, NOSE;  Surgeon: Jesus Oliphant, MD;  Location: [facility] OR;  Service: ENT;  Laterality: Right;   PERIPHERAL VASCULAR BALLOON ANGIOPLASTY  09/02/2021   Procedure: PERIPHERAL VASCULAR BALLOON ANGIOPLASTY;  Surgeon: Serene Gaile ORN, MD;  [facility];  Service: Cardiovascular;;   PERIPHERAL VASCULAR BALLOON ANGIOPLASTY  12/28/2021   Procedure: PERIPHERAL VASCULAR BALLOON ANGIOPLASTY;  Surgeon: Serene Gaile ORN, MD;  [facility];  Service: Cardiovascular;;  Posterior Tibial PTA only   POLYPECTOMY Right 06/30/2023   Procedure: POLYPECTOMY, NASAL CAVITY;  Surgeon: Jesus Oliphant, MD;  Location: [facility] OR;  Service: ENT;  Laterality: Right;   TEE WITHOUT CARDIOVERSION N/A 09/28/2017   Procedure: TRANSESOPHAGEAL ECHOCARDIOGRAM (TEE);  Surgeon: Francyne Headland, MD;  Location: [facility] ENDOSCOPY;  Service: Cardiovascular;  Laterality: N/A;   TEE WITHOUT CARDIOVERSION N/A 09/03/2021   Procedure: TRANSESOPHAGEAL ECHOCARDIOGRAM (TEE);  Surgeon: Raford Riggs, MD;  Location: [facility] ENDOSCOPY;  Service: Cardiovascular;  Laterality: N/A;   TEE WITHOUT CARDIOVERSION N/A 07/15/2022   Procedure: TRANSESOPHAGEAL ECHOCARDIOGRAM;  Surgeon: Francyne Headland, MD;  [facility];  Service: Cardiovascular;  Laterality: N/A;   TEE WITHOUT CARDIOVERSION N/A 04/18/2023   Procedure: TRANSESOPHAGEAL ECHOCARDIOGRAM (TEE);  Surgeon: Lucas Dorise POUR, MD;  Location: [facility] OR;  Service: Open Heart Surgery;  Laterality: N/A;   TOTAL HIP ARTHROPLASTY Left 07/14/2023   Procedure: ARTHROPLASTY, HIP, TOTAL, ANTERIOR APPROACH;  Surgeon: Kendal Franky SQUIBB, MD;  [facility];  Service: Orthopedics;  Laterality: Left;   Patient Active Problem List   Diagnosis Date Noted   Left displaced femoral neck fracture (HCC) 07/13/2023   Closed left femoral fracture (HCC) 07/12/2023   History of CHF (congestive heart failure) 07/12/2023   Chronic kidney disease  (CKD), stage III (moderate) (HCC) 07/12/2023   Hyperlipidemia 07/12/2023   BPH (benign prostatic hyperplasia) 07/12/2023   Peripheral neuropathy 07/12/2023   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   Bradycardia 04/14/2023   NSTEMI (non-ST elevated myocardial infarction) (HCC) 04/13/2023   Chronic bilateral low back pain 04/13/2023   History of CABG in  04/2023 04/12/2023   Acute decompensated heart failure (HCC) 04/10/2023   Chronic pain syndrome 04/09/2023   MRSA infection greater than 3 months ago 04/09/2023   Hypoglycemia 10/06/2022   Pleural effusion 09/29/2022   Chronic systolic CHF (congestive heart failure) (HCC) 09/29/2022   Acute urinary retention 09/29/2022   Anxiety 09/29/2022   Hardware complicating wound infection (HCC) 09/25/2022   Discitis of thoracic region 09/25/2022   Hypokalemia 09/07/2022   Acute toxic encephalopathy 09/07/2022   Acute postoperative anemia due to expected blood loss 09/07/2022   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   Adjustment disorder with mixed anxiety and depressed mood 08/13/2022   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 08/08/2022   MRSA bacteremia 08/08/2022   AKI (acute kidney injury), ruled out 07/27/2022   Discitis thoracic region 07/27/2022   Bacteremia due to methicillin resistant Staphylococcus aureus 07/27/2022   Severe protein-calorie malnutrition (HCC) 07/26/2022   PVD (peripheral vascular disease) (HCC) 07/26/2022   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   Bacteremia 07/15/2022   Atelectasis 07/12/2022  Sepsis due to pneumonia (HCC) 07/12/2022   Acute on chronic diastolic (congestive) heart failure (HCC) 07/12/2022   Gangrene of toe of right foot (HCC)    Sepsis (HCC) 08/29/2021   SIRS (systemic inflammatory response syndrome) (HCC) 08/29/2021   Hyponatremia 08/29/2021   Hypomagnesemia 08/29/2021   Pure hypercholesterolemia 06/17/2021   Pain of left hand 09/06/2019   Cellulitis 08/26/2019   Persistent atrial fibrillation (HCC) 08/26/2019    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) 10/18/2018   Transaminitis 10/17/2018   Hyperbilirubinemia 10/17/2018   Leukocytosis 10/17/2018   Sinusitis 10/17/2018   Chronic anticoagulation 09/30/2017   Smoker 09/30/2017   Cardiomyopathy (HCC) 09/30/2017   Sleep apnea suspected 09/30/2017   Essential hypertension 09/26/2017   Insulin  dependent type 2 diabetes mellitus (HCC) 09/26/2017   Permanent atrial fibrillation (HCC) 09/26/2017    PCP: Delbert Clam, MD  REFERRING PROVIDER: Kendal Franky SQUIBB, MD  REFERRING DIAG: [phone] (ICD-10-CM) - Status post total hip replacement, left  THERAPY DIAG:  Pain in left hip  Other abnormalities of gait and mobility  Stiffness of left hip, not elsewhere classified  Muscle weakness (generalized)  Unsteadiness on feet  Cramp and spasm  Difficulty in walking, not elsewhere classified  Abnormal posture  Rationale for Evaluation and Treatment: Rehabilitation  ONSET DATE: 09/26/2023  SUBJECTIVE:   SUBJECTIVE STATEMENT:  Pt reports less pain he had from his fall last week where he bruised thoracic ribs T10-T12 and forehead. His pain is same as usual in the hip. Pt reports he reached out to his PCP to speak with a mental health counselor/therapist. He states he's depressed that he cannot move like he used to be able to and wants to. He states he would like to lose weight, and overall he wants to feel better.    EVAL: Patient had series of orthopedic issues. He was in PT for back pain when he was found to have MRSA infection in spine May 2024, surgery followed for fusion/reconstruction, January had CABG, was still on cane once he got home and fell in bathroom and suffered hip fracture on left with subsequent THA. His THA was on 07/14/23. He was in rehab for several weeks and is now home. He felt he did not get aggressive enough PT in rehab. He then had home health and only did this 1 time per week. He did his exercises and  is very motivated to get his function back. He is unsure if he will return to work. He is concerned about the physicality of the job with all that he has endured. He states this all depends on how much function he gets back. His goal is to be able to walk without the cane and build some upper body strength to be able to function at the level he did before and do his job.   PERTINENT HISTORY: Left LE neural impairment since back surgery; right toe amputations See above for extensive medical history  PAIN: 10/25/23 Are you having pain? Yes: NPRS scale: 3/10 Pain location: left hip Pain description: aching / stiff Aggravating factors: sitting Relieving factors: moving  PRECAUTIONS: Fall  RED FLAGS: None   WEIGHT BEARING RESTRICTIONS: No  FALLS:  Has patient fallen in last 6 months? Yes. Number of falls 1  LIVING ENVIRONMENT: Lives with: lives with their spouse Lives in: House/apartment Stairs: No Has following equipment at home: Single point cane  OCCUPATION: chef  PLOF: Independent, Independent with basic ADLs, Independent with household mobility without device, Independent with  community mobility without device, Independent with homemaking with ambulation, Independent with gait, and Independent with transfers  PATIENT GOALS: To be able to walk without the cane and build some upper body strength to be able to function at the level he did before and do his job.    NEXT MD VISIT: 2 months  OBJECTIVE:  Note: Objective measures were completed at Evaluation unless otherwise noted.  DIAGNOSTIC FINDINGS: na  PATIENT SURVEYS:  Eval:  LEFS : 30/80= 50%  COGNITION: Overall cognitive status: Within functional limits for tasks assessed     SENSATION: WFL   MUSCLE LENGTH: Hips tight bilaterally in hip flexors, quads and rotation both IR and ER   Left > Right  POSTURE: rounded shoulders, forward head, decreased lumbar lordosis, posterior pelvic tilt, and flexed trunk    LOWER  EXTREMITY ROM:  WFL  LOWER EXTREMITY MMT:  MMT Right eval Left eval  Hip flexion  4  Hip extension  4-  Hip abduction  4  Hip adduction  4  Hip internal rotation  4  Hip external rotation  4  Knee flexion  4  Knee extension  4+  Ankle dorsiflexion    Ankle plantarflexion    Ankle inversion    Ankle eversion     (Blank rows = not tested)   FUNCTIONAL TESTS:  Eval: 5 times sit to stand: 15.14 sec (no UE use) Timed up and go (TUG): 14.00 sec  GAIT: Distance walked: 50 feet Assistive device utilized: Single point cane Level of assistance: SBA Comments: antalgic/guarded/ slow                                                                                                                                TREATMENT DATE:   10/25/23 Nustep level 5 for 8 min  PPT 2x10 in supine to increase lumbopelvic stability Glute Bridges in supine 2x10 Seated marches 2x10 bil 2.5 lbs Side steps at barre with 2.5 lbs 2x10 Standing marches at barre with 2.5 lbs 2x8 Standing Paloff press at [employer] machine 5 lbs  2x 10 each side to increase abdominal strength  Quad set 2x10 each side Leg press seat 8 2x10 80 # 2x15  10/23/23 Nustep level 4 for 8 min PPT 2x10 in supine to increase lumbopelvic stability Glute bridges in supine 3x8  Seated marches 3x10 with 1 lb ankle weights TKE 3x10 red TB Standing heel raises using mirror therapy for external feedback on neuromuscular control 2x10 Standing heel raises with ball squeeze at ankles 2x10 to increase posterior tibialis muscle strength Leg press seat 8 2x10 70 # 2x15 Resistance band at ankle black DF/Inv/Ev 1x10 on each ankle with noted substitutions from knee only; leg extended resting on PT student's bended knee  10/20/23:Pt arrives for aquatic physical therapy. Treatment took place in 3.5-5.5 feet of water. Water temperature was 90 degrees F. Pt entered the pool via stairs with moderate use of hand rails. Pt  requires buoyancy of water for  support and to offload joints with strengthening exercises.  Seated water bench with 75% submersion Pt performed seated LE AROM exercises 20x in all planes,concurrent discussion of status. 75% depth water walking 10x in each direction with Q-tip. Standing hip 3 ways with min-mod push/pull 15x Bil. High knee marching in place holding Q-tip 2x20. Sit to stand from 3rd step 5x. Requires stabilization from hand rail, VC to get weight forward. Underwater seated decompression with noodle behind him 3 min.    Clincal impression: Mr Heinkel shows up at every session ready to work hard and address noted deficits.  Pt reports he can tell PT is working and he is getting stronger in his legs. He still presents with unsteady gait and decreased LE strength. He was able to complete the paloff press in standing, maintaining proper body mechanics and balance, finding it to be an appropriate challenge. Plan to progress hip strengthening and balancing exercises for next session. Pt reported he feels depressed by his immobility and reached out to his PCP yesterday in request for a mental health counselor/therapist. Pt will benefit to continue skilled PT to address LE muscle weakness, neuromuscular activation, and proprioceptive awareness so he is able to ambulate independently and having no falls.     PATIENT EDUCATION:  Education details: Initiated HEP Person educated: Patient Education method: Programmer, multimedia, Facilities manager, Verbal cues, and Handouts Education comprehension: verbalized understanding and returned demonstration  HOME EXERCISE PROGRAM: Access Code: [email or web address] URL: [email or web address] Date: 10/04/2023 Prepared by: Glade Pesa  Exercises - Squat with Chair Touch  - 1 x daily - 7 x weekly - 4 sets - 5 reps - Side Stepping with Resistance at Ankles  - 1 x daily - 7 x weekly - 3 sets - 10 reps - Seated Hip Abduction with Resistance  - Finley pushes himself a lot while exercising in the  water, possibly pushes himself too hard. Increased workload today by increasing reps and difficulty of each exercise.. Qtip provided less support with standing activities. Sit to stand exercise extremely difficult.     OBJECTIVE IMPAIRMENTS: Abnormal gait, cardiopulmonary status limiting activity, decreased activity tolerance, decreased balance, decreased endurance, difficulty walking, decreased ROM, decreased strength, increased fascial restrictions, increased muscle spasms, impaired flexibility, impaired UE functional use, improper body mechanics, postural dysfunction, and pain.   ACTIVITY LIMITATIONS: carrying, lifting, bending, sitting, standing, squatting, sleeping, stairs, transfers, bed mobility, bathing, toileting, dressing, reach over head, hygiene/grooming, and caring for others  PARTICIPATION LIMITATIONS: meal prep, cleaning, laundry, driving, shopping, community activity, occupation, yard work, and church  PERSONAL FACTORS: Fitness, Past/current experiences, Profession, Time since onset of injury/illness/exacerbation, and 3+ comorbidities: spine surgery, CABG, diabetes and Htn are also affecting patient's functional outcome.   REHAB POTENTIAL: Good  CLINICAL DECISION MAKING: Unstable/unpredictable  EVALUATION COMPLEXITY: High   GOALS: Goals reviewed with patient? Yes  SHORT TERM GOALS: Target date: 10/26/2023  Pain report to be no greater than 4/10  Baseline: Goal status: Ongoing  2.  Patient will be independent with initial HEP  Baseline:  Goal status: Met on 10/18/23  3.  Patient to be able to ambulate 50 feet without a.d Baseline:  Goal status: Ongoing  4.  Patient to be able to do 10 sit to stand without UE support  Baseline:  Goal status: Ongoing    LONG TERM GOALS: Target date: 11/23/2023  Patient to report pain no greater than 2/10  Baseline:  Goal status: Progressing   2.  Patient to be independent  with advanced HEP  Baseline:  Goal status: ongoing    3.  Patient to be able to stand or walk for at least 15 min without pain or need to rest Baseline:  Goal status: INITIAL  4.  Patient to be able to ascend and descend 12 steps safely Baseline:  Goal status: Ongoing   5.  Patient to report 85% improvement in overall symptoms and function Baseline:  Goal status: Ongoing  6.  Patient to be able to walk 500 feet without a.d. safely without rest breaks Baseline:  Goal status: Ongoing    PLAN:  PT FREQUENCY: 3x/week  PT DURATION: 8 weeks  PLANNED INTERVENTIONS: 97110-Therapeutic exercises, 97530- Therapeutic activity, [id number]- Neuromuscular re-education, 97535- Self Care, 02859- Manual therapy, [phone]- Gait training, [phone]- Aquatic Therapy, [phone]- Electrical stimulation (unattended), [phone]- Electrical stimulation (manual), [id number]- Vasopneumatic device, 97035- Ultrasound, 02966- Ionotophoresis 4mg /ml Dexamethasone , Patient/Family education, Balance training, Stair training, Taping, Joint mobilization, Spinal mobilization, Cryotherapy, and Moist heat  PLAN FOR NEXT SESSION: Nustep, continue functional strength training: he needs higher level functional training; update HEP  Lavanda Cleverly, SPT 10/25/23 1:23 PM   I agree with the following treatment note after reviewing documentation. This session was performed under the supervision of a licensed clinician.  Jarrell Laming, PT, DPT 10/25/23, 1:23 PM   [facility] [address] [place], [place] [address] Phone # [phone] Fax [phone]

## 2023-10-25 NOTE — Progress Notes (Signed)
 Complex Care Management Note  Care Guide Note 10/25/2023 Name: David [name] MRN: [id number] DOB: 11/08/1960  David [name] is a 63 y.o. year old male who sees Delbert Clam, MD for primary care. I reached out to David [name] by phone today to offer complex care management services.  [name] was given information about Complex Care Management services today including:   The Complex Care Management services include support from the care team which includes your Nurse Care Manager, Clinical Social Worker, or Pharmacist.  The Complex Care Management team is here to help remove barriers to the health concerns and goals most important to you. Complex Care Management services are voluntary, and the patient may decline or stop services at any time by request to their care team member.   Complex Care Management Consent Status: Patient agreed to services and verbal consent obtained.   Follow up plan:  Telephone appointment with complex care management team member scheduled for:  10/31/2023  Encounter Outcome:  Patient Scheduled  Thedford Franks, CMA [place]  [facility], [facility] Guide Direct Dial : [phone]  Fax: [phone] Website: [email or web address]

## 2023-10-25 NOTE — Progress Notes (Signed)
 Complex Care Management Note Care Guide Note  10/25/2023 Name: David [name] MRN: [id number] DOB: [birth year]   Complex Care Management Outreach Attempts: An unsuccessful telephone outreach was attempted today to offer the patient information about available complex care management services.  Follow Up Plan:  Additional outreach attempts will be made to offer the patient complex care management information and services.   Encounter Outcome:  No Answer  Thedford Franks, CMA [place]  [facility], [facility] Guide Direct Dial : [phone]  Fax: [phone] Website: [email or web address]

## 2023-10-26 ENCOUNTER — Emergency Department (HOSPITAL_COMMUNITY)

## 2023-10-26 ENCOUNTER — Emergency Department (HOSPITAL_COMMUNITY)
Admission: EM | Admit: 2023-10-26 | Discharge: 2023-10-26 | Disposition: A | Discharge status: Home / Self Care | Attending: Emergency Medicine | Admitting: Emergency Medicine

## 2023-10-26 ENCOUNTER — Encounter (HOSPITAL_COMMUNITY): Payer: Self-pay

## 2023-10-26 ENCOUNTER — Other Ambulatory Visit (HOSPITAL_COMMUNITY): Payer: Self-pay

## 2023-10-26 ENCOUNTER — Other Ambulatory Visit: Payer: Self-pay

## 2023-10-26 ENCOUNTER — Encounter

## 2023-10-26 DIAGNOSIS — W2203XA Walked into furniture, initial encounter: Secondary | ICD-10-CM | POA: Diagnosis not present

## 2023-10-26 DIAGNOSIS — Z7984 Long term (current) use of oral hypoglycemic drugs: Secondary | ICD-10-CM | POA: Diagnosis not present

## 2023-10-26 DIAGNOSIS — E11621 Type 2 diabetes mellitus with foot ulcer: Secondary | ICD-10-CM | POA: Diagnosis not present

## 2023-10-26 DIAGNOSIS — Z794 Long term (current) use of insulin: Secondary | ICD-10-CM | POA: Insufficient documentation

## 2023-10-26 DIAGNOSIS — I509 Heart failure, unspecified: Secondary | ICD-10-CM | POA: Insufficient documentation

## 2023-10-26 DIAGNOSIS — S99921A Unspecified injury of right foot, initial encounter: Secondary | ICD-10-CM | POA: Diagnosis present

## 2023-10-26 DIAGNOSIS — S92524A Nondisplaced fracture of medial phalanx of right lesser toe(s), initial encounter for closed fracture: Secondary | ICD-10-CM | POA: Insufficient documentation

## 2023-10-26 DIAGNOSIS — L97511 Non-pressure chronic ulcer of other part of right foot limited to breakdown of skin: Secondary | ICD-10-CM | POA: Diagnosis not present

## 2023-10-26 DIAGNOSIS — I110 Hypertensive heart disease with heart failure: Secondary | ICD-10-CM | POA: Insufficient documentation

## 2023-10-26 DIAGNOSIS — Z7901 Long term (current) use of anticoagulants: Secondary | ICD-10-CM | POA: Diagnosis not present

## 2023-10-26 LAB — BASIC METABOLIC PANEL WITH GFR
Anion gap: 11 (ref 5–15)
BUN: 37 mg/dL — ABNORMAL HIGH (ref 8–23)
CO2: 26 mmol/L (ref 22–32)
Calcium: 9.5 mg/dL (ref 8.9–10.3)
Chloride: 99 mmol/L (ref 98–111)
Creatinine, Ser: 1.83 mg/dL — ABNORMAL HIGH (ref 0.61–1.24)
GFR, Estimated: 41 mL/min — ABNORMAL LOW (ref >60)
Glucose, Bld: 279 mg/dL — ABNORMAL HIGH (ref 70–99)
Potassium: 4.8 mmol/L (ref 3.5–5.1)
Sodium: 136 mmol/L (ref 135–145)

## 2023-10-26 LAB — CBC
HCT: 43.4 % (ref 39.0–52.0)
Hemoglobin: 14.1 g/dL (ref 13.0–17.0)
MCH: 29.3 pg (ref 26.0–34.0)
MCHC: 32.5 g/dL (ref 30.0–36.0)
MCV: 90.2 fL (ref 80.0–100.0)
Platelets: 211 K/uL (ref 150–400)
RBC: 4.81 MIL/uL (ref 4.22–5.81)
RDW: 15.7 % — ABNORMAL HIGH (ref 11.5–15.5)
WBC: 10 K/uL (ref 4.0–10.5)
nRBC: 0 % (ref 0.0–0.2)

## 2023-10-26 MED ORDER — VANCOMYCIN HCL IN DEXTROSE 1-5 GM/200ML-% IV SOLN: 1000.0000 mg | Freq: Once | INTRAVENOUS | Status: DC

## 2023-10-26 MED ORDER — OXYCODONE HCL 5 MG PO TABS
10.0000 mg | ORAL_TABLET | Freq: Once | ORAL | Status: AC
  Administered 2023-10-26: 10 mg via ORAL
  Filled 2023-10-26: qty 2

## 2023-10-26 MED ORDER — CEPHALEXIN 500 MG PO CAPS
500.0000 mg | ORAL_CAPSULE | Freq: Four times a day (QID) | ORAL | 0 refills | Status: AC
Start: 2023-10-26 — End: ?

## 2023-10-26 MED ORDER — VANCOMYCIN HCL 1750 MG/350ML IV SOLN
1750.0000 mg | Freq: Once | INTRAVENOUS | Status: AC
  Administered 2023-10-26: 1750 mg via INTRAVENOUS
  Filled 2023-10-26: qty 350

## 2023-10-26 NOTE — Discharge Instructions (Addendum)
 Please change the bandage on the toe once daily, and call Dr. Claudio  for close follow-up. Please take the entire course of antibiotics that have been prescribed.  Please return for further evaluation if you develop severe fever, chills, worsening swelling, redness around the toe.  When changing the bandage daily continue to monitor for signs of progression.  Continue taking your home doxycycline .

## 2023-10-26 NOTE — Progress Notes (Signed)
 ED Pharmacy Antibiotic Sign Off An antibiotic consult was received from an ED provider for vancomycin  per pharmacy dosing for wound infection. A chart review was completed to assess appropriateness.   The following one time order(s) were placed:  Vancomycin  1750 mg IV  Further antibiotic and/or antibiotic pharmacy consults should be ordered by the admitting provider if indicated.    Ronal CHRISTELLA Rav, [facility]  Clinical Pharmacist 10/26/23 8:46 PM

## 2023-10-26 NOTE — ED Triage Notes (Signed)
 PT complaining of the middle toe on the right foot possibly being broken a week ago. He buddy taped it to the second toe thinking it would get better. Now the toe is swollen and has what looks like a blister on it. Is draining and he is a diabetic with the 4 and 5 toes amputated.

## 2023-10-26 NOTE — ED Provider Notes (Signed)
 [place] EMERGENCY DEPARTMENT AT [facility] Provider Note   CSN: [id number] Arrival date & time: 10/26/23  2009     Patient presents with: Foot Pain   David [name] is a 63 y.o. male with past medical history significant for hypertension, CHF, diabetes, previous toe amputations who presents with injury to right middle toe around one week ago. Reports he hit it against a chair or desk and felt like it might be broken, he buddy taped it to the second toe. Now presenting with swelling, and skin ulcer / injury where the tape was cutting into the toe. Colonized with MRSA, on chronic doxycycline .   HPI     Prior to Admission medications   Medication Sig Start Date End Date Taking? Authorizing Provider  cephALEXin  (KEFLEX ) 500 MG capsule Take 1 capsule (500 mg total) by mouth 4 (four) times daily. 10/26/23  Yes Leveta Wahab H, PA-C  ALPRAZolam  (XANAX ) 0.5 MG tablet Take 1 tablet (0.5 mg total) by mouth at bedtime as needed for anxiety or sleep. 07/21/23   Elgergawy, Brayton RAMAN, MD  amiodarone  (PACERONE ) 200 MG tablet Take 1 tablet (200 mg total) by mouth daily for 10 days. 07/21/23 09/25/23  Elgergawy, Brayton RAMAN, MD  apixaban  (ELIQUIS ) 5 MG TABS tablet Take 1 tablet (5 mg total) by mouth 2 (two) times daily. 05/15/23   Nahser, Aleene PARAS, MD  carvedilol  (COREG ) 6.25 MG tablet Take 1 tablet (6.25 mg total) by mouth 2 (two) times daily. 06/16/23   Lucien Orren SAILOR, PA-C  Continuous Glucose Sensor (DEXCOM G7 SENSOR) MISC Use as directed & change every 10 days. 03/10/23   Newlin, Enobong, MD  dapagliflozin  propanediol (FARXIGA ) 10 MG TABS tablet Take 1 tablet (10 mg total) by mouth daily before breakfast. 03/06/23   Newlin, Enobong, MD  docusate sodium  (COLACE) 100 MG capsule Take 2 capsules (200 mg total) by mouth 2 (two) times daily. 07/21/23   Elgergawy, Brayton RAMAN, MD  doxycycline  (VIBRA -TABS) 100 MG tablet Take 1 tablet (100 mg total) by mouth every 12 (twelve) hours. 03/20/23   Dennise Kingsley, MD  Evolocumab  (REPATHA  SURECLICK) 140 MG/ML SOAJ Inject 140 mg into the skin every 14 (fourteen) days. 07/14/23   Delford Maude BROCKS, MD  famotidine  (PEPCID ) 20 MG tablet Take 1 tablet (20 mg total) by mouth 2 times daily. 10/10/23   Newlin, Enobong, MD  furosemide  (LASIX ) 40 MG tablet Take 1 tablet (40 mg total) by mouth 2 (two) times daily. 10/24/23 11/23/23  Newlin, Enobong, MD  HYDROcodone -acetaminophen  (NORCO) 10-325 MG tablet Take 1 tablet by mouth every 4 (four) hours as needed for moderate pain (pain score 4-6). 07/21/23   Elgergawy, Brayton RAMAN, MD  icosapent  Ethyl (VASCEPA ) 1 g capsule Take 2 capsules (2 g total) by mouth 2 (two) times daily. 10/02/23   Nishan, Peter C, MD  insulin  aspart (NOVOLOG ) 100 UNIT/ML injection Inject 0-15 Units into the skin 3 (three) times daily with meals. insulin  aspart (novoLOG ) injection 0-15 Units  0-15 Units, Subcutaneous, 3 times daily with meals, First dose on Tue 07/18/23 at 1200 Correction coverage: Moderate (average weight, post-op) CBG < 70: Implement Hypoglycemia Standing Orders and refer to Hypoglycemia Standing Orders sidebar report CBG 70 - 120: 0 units CBG 121 - 150: 2 units CBG 151 - 200: 3 units CBG 201 - 250: 5 units CBG 251 - 300: 8 units CBG 301 - 350: 11 units CBG 351 - 400: 15 units 07/21/23   Elgergawy, Brayton RAMAN, MD  insulin  glargine (LANTUS  SOLOSTAR) 100 UNIT/ML Solostar Pen Inject 27 Units into the skin daily. 10/02/23   Newlin, Enobong, MD  Insulin  Pen Needle (PEN NEEDLES) 32G X 4 MM MISC Use to inject insulin  once daily. 10/02/23   Newlin, Enobong, MD  methocarbamol  (ROBAXIN ) 750 MG tablet Take 1 tablet (750 mg total) by mouth every 8 (eight) hours as needed for muscle spasms. 10/24/23   Newlin, Enobong, MD  naloxone  (NARCAN ) nasal spray 4 mg/0.1 mL Opiates overdose Patient taking differently: Place 1 spray into the nose once. Opiates overdose 10/21/22   Akula, Vijaya, MD  OXYCONTIN  10 MG 12 hr tablet Take 1 tablet (10 mg total) by mouth every 12  (twelve) hours. 07/21/23   Elgergawy, Brayton RAMAN, MD  polyethylene glycol (MIRALAX  / GLYCOLAX ) 17 g packet Mix 1 packet (17 g) in beverage and take by mouth 2 (two) times daily. Patient taking differently: Take 17 g by mouth every other day. 06/22/23   Danton Jon HERO, PA-C  Potassium Chloride  ER 20 MEQ TBCR Take 1 tablet (20 mEq total) by mouth daily. 09/12/23   Newlin, Enobong, MD  pregabalin  (LYRICA ) 75 MG capsule Take 1 capsule (75 mg total) by mouth 2 (two) times daily. 09/12/23   Newlin, Enobong, MD  rosuvastatin  (CRESTOR ) 20 MG tablet Take 1 tablet (20 mg total) by mouth daily. 10/16/23 01/14/24  Delford Maude BROCKS, MD  tamsulosin  (FLOMAX ) 0.4 MG CAPS capsule Take 1 capsule (0.4 mg total) by mouth daily after supper. 10/24/23   Newlin, Enobong, MD  terbinafine  (LAMISIL ) 250 MG tablet Take 1 tablet (250 mg total) by mouth daily. 09/12/23   Newlin, Enobong, MD  Vitamin D , Ergocalciferol , (DRISDOL ) 1.25 MG (50000 UNIT) CAPS capsule Take 1 capsule (50,000 Units total) by mouth every 7 (seven) days. 07/19/23   Danton Lauraine LABOR, PA-C    Allergies: Patient has no known allergies.    Review of Systems  All other systems reviewed and are negative.   Updated Vital Signs BP (!) 147/95 (BP Location: Right Arm)   Pulse 81   Temp 98.4 F (36.9 C) (Oral)   Resp 18   Ht 6' (1.829 m)   Wt 91.2 kg   SpO2 96%   BMI 27.26 kg/m   Physical Exam Vitals and nursing note reviewed.  Constitutional:      General: He is not in acute distress.    Appearance: Normal appearance.  HENT:     Head: Normocephalic and atraumatic.  Eyes:     General:        Right eye: No discharge.        Left eye: No discharge.  Cardiovascular:     Rate and Rhythm: Normal rate and regular rhythm.     Pulses: Normal pulses.     Comments: Dp,pt pulse 2+ Pulmonary:     Effort: Pulmonary effort is normal. No respiratory distress.  Musculoskeletal:        General: No deformity.  Skin:    General: Skin is warm and dry.      Capillary Refill: Capillary refill takes less than 2 seconds.     Comments: Ulceration, swelling around the right middle toe at the MTP.  Some draining.  Some foul smell.  Neurological:     Mental Status: He is alert and oriented to person, place, and time.  Psychiatric:        Mood and Affect: Mood normal.        Behavior: Behavior normal.     (all  labs ordered are listed, but only abnormal results are displayed) Labs Reviewed  CBC - Abnormal; Notable for the following components:      Result Value   RDW 15.7 (*)    All other components within normal limits  BASIC METABOLIC PANEL WITH GFR - Abnormal; Notable for the following components:   Glucose, Bld 279 (*)    BUN 37 (*)    Creatinine, Ser 1.83 (*)    GFR, Estimated 41 (*)    All other components within normal limits    EKG: None  Radiology: DG Foot Complete Right Result Date: 10/26/2023 CLINICAL DATA:  Status post trauma to the third right toe. 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 A very thin, ill-defined linear lucency is seen extending through the midportion of the proximal phalanx of the third right toe. This is of indeterminate age. There is no evidence of dislocation. Lateral angulation of the third right toe is noted which may be positional in nature. There is no evidence of arthropathy or other focal bone abnormality. Soft tissues are unremarkable. IMPRESSION: 1. 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 2. Findings which may represent a nondisplaced fracture of indeterminate age involving the proximal phalanx of the third right toe. Correlation with physical examination is recommended to determine the presence of point tenderness. Electronically Signed   By: Suzen Dials M.D.   On: 10/26/2023 20:56     Procedures   Medications Ordered in the ED  vancomycin  (VANCOREADY) IVPB  1750 mg/350 mL (1,750 mg Intravenous New Bag/Given 10/26/23 2111)  oxyCODONE  (Oxy IR/ROXICODONE ) immediate release tablet 10 mg (10 mg Oral Given 10/26/23 2216)                                    Medical Decision Making Amount and/or Complexity of Data Reviewed Labs: ordered. Radiology: ordered.  Risk Prescription drug management.   This patient is a 63 y.o. male who presents to the ED for concern of foot wound.   Differential diagnoses prior to evaluation: Diabetic foot wound, osteomyelitis, fracture  Past Medical History / Social History / Additional history: Chart reviewed. Pertinent results include: hypertension, CHF, diabetes, previous toe amputations  Physical Exam: Physical exam performed. The pertinent findings include: Ulceration, swelling around the right middle toe at the MTP.  Some draining.  Some foul smell.  CBC unremarkable, notably with no leukocytosis.  BMP with mild hyperglycemia, glucose 279, BUN mildly elevated, 37, creatinine 1.83.  Plain film radiograph of the right foot independently interpreted by myself shows:   1. Prior transmetatarsal amputation of the fourth and fifth right  toes.  2. Findings which may represent a nondisplaced fracture of  indeterminate age involving the proximal phalanx of the third right  toe. Correlation with physical examination is recommended to  determine the presence of point tenderness.   I agree with radiologist interpretation  Medications / Treatment: Pain medicine given the emergency department for chronic back pain due to discomfort during treatment with vancomycin , 1 dose vancomycin  given due to concern for diabetic foot wound.  Encourage patient to continue his home doxycycline , wrap the foot without buddy taping, will prescribed keflex  and encouraged close follow up with Dr. Dial  with podiatry   Disposition: After consideration of the diagnostic results and the patients response to treatment,  I feel that patient stable  for discharge with plan as above.   emergency department workup does not suggest an emergent condition requiring admission or immediate intervention beyond what has been performed at this time. The plan is: as above. The patient is safe for discharge and has been instructed to return immediately for worsening symptoms, change in symptoms or any other concerns.   Final diagnoses:  Closed nondisplaced fracture of middle phalanx of lesser toe of right foot, initial encounter  Diabetic ulcer of toe of right foot associated with type 2 diabetes mellitus, limited to breakdown of skin [facility])    ED Discharge Orders          Ordered    cephALEXin  (KEFLEX ) 500 MG capsule  4 times daily        10/26/23 2158               Rosan Sherlean VEAR DEVONNA 10/26/23 2219    Patt Alm Macho, MD 10/26/23 2231

## 2023-10-30 ENCOUNTER — Encounter: Payer: Self-pay | Admitting: Rehabilitative and Restorative Service Providers"

## 2023-10-30 ENCOUNTER — Ambulatory Visit: Admitting: Rehabilitative and Restorative Service Providers"

## 2023-10-30 DIAGNOSIS — M25552 Pain in left hip: Secondary | ICD-10-CM | POA: Diagnosis not present

## 2023-10-30 DIAGNOSIS — M6281 Muscle weakness (generalized): Secondary | ICD-10-CM

## 2023-10-30 DIAGNOSIS — R262 Difficulty in walking, not elsewhere classified: Secondary | ICD-10-CM

## 2023-10-30 DIAGNOSIS — R2681 Unsteadiness on feet: Secondary | ICD-10-CM

## 2023-10-30 DIAGNOSIS — M25652 Stiffness of left hip, not elsewhere classified: Secondary | ICD-10-CM

## 2023-10-30 NOTE — Therapy (Signed)
 OUTPATIENT PHYSICAL THERAPY TREATMENT NOTE   Patient Name: David [name] MRN: [id number] DOB:01-16-1961, 63 y.o., male Today's Date: 10/30/2023  END OF SESSION:  PT End of Session - 10/30/23 1317     Visit Number 8    Date for PT Re-Evaluation 11/23/23    Authorization Type Medicaid no auth 27 visits    PT Start Time 1235    PT Stop Time 1316    PT Time Calculation (min) 41 min    Activity Tolerance Patient tolerated treatment well    Behavior During Therapy WFL for tasks assessed/performed                Past Medical History:  Diagnosis Date   Cardiomyopathy (HCC)    a. EF 45% in 2019.   CHF (congestive heart failure) (HCC)    Chronic anticoagulation 09/30/2017   Diabetes mellitus type 2 in nonobese [facility])    Discitis of thoracic region 09/25/2022   Does not have health insurance    Essential hypertension 09/26/2017   Financial difficulties    H/O noncompliance with medical treatment, presenting hazards to health    Hardware complicating wound infection (HCC) 09/25/2022   Non-insulin  treated type 2 diabetes mellitus (HCC) 09/26/2017   Persistent atrial fibrillation (HCC)    Sleep apnea suspected 09/30/2017   years ago- not anymore per patient   Past Surgical History:  Procedure Laterality Date   ABDOMINAL AORTOGRAM W/LOWER EXTREMITY N/A 09/02/2021   Procedure: ABDOMINAL AORTOGRAM W/LOWER EXTREMITY;  Surgeon: Serene Gaile ORN, MD;  [facility];  Service: Cardiovascular;  Laterality: N/A;   ABDOMINAL AORTOGRAM W/LOWER EXTREMITY N/A 12/28/2021   Procedure: ABDOMINAL AORTOGRAM W/LOWER EXTREMITY;  Surgeon: Serene Gaile ORN, MD;  [facility]: [facility];  Service: Cardiovascular;  Laterality: N/A;   ABDOMINAL AORTOGRAM W/LOWER EXTREMITY N/A 07/28/2022   Procedure: ABDOMINAL AORTOGRAM W/LOWER EXTREMITY;  Surgeon: Gretta Lonni PARAS, MD;  [facility]: [facility];  Service: Cardiovascular;  Laterality: N/A;   AMPUTATION Right 09/04/2021    Procedure: 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;  Surgeon: Harden Jerona GAILS, MD;  Location: [facility] OR;  Service: Orthopedics;  Laterality: Right;   AMPUTATION Right 08/02/2022   Procedure: AMPUTATION RAY 5TH METATARSAL OF RIGHT FOOT;  Surgeon: Barton Drape, MD;  Location: [facility];  Service: Orthopedics;  Laterality: Right;   APPENDECTOMY  1971   BONE BIOPSY Right 08/02/2022   Procedure: BONE BIOPSY OF 5TH METATARSAL RIGHT FOOT;  Surgeon: Barton Drape, MD;  [facility]: [facility];  Service: Orthopedics;  Laterality: Right;   CARDIOVERSION N/A 09/28/2017   Procedure: CARDIOVERSION;  Surgeon: Francyne Headland, MD;  [facility];  Service: Cardiovascular;  Laterality: N/A;   CLIPPING OF ATRIAL APPENDAGE N/A 04/18/2023   Procedure: CLIPPING OF ATRIAL APPENDAGE USING ATRICURE ATRICLIP [id number] with pulmonary vein isolation ablation;  Surgeon: Lucas Dorise POUR, MD;  [facility]: [facility];  Service: Open Heart Surgery;  Laterality: N/A;   CORONARY ARTERY BYPASS GRAFT N/A 04/18/2023   Procedure: CORONARY ARTERY BYPASS GRAFTING (CABG) TIMES FOUR USING LEFT INTERNAL MAMMARY ARTERY AND ENDOSCOPICALLY HARVESTED LEFT GREATER SAPHENOUS VEIN;  Surgeon: Lucas Dorise POUR, MD;  [facility];  Service: Open Heart Surgery;  Laterality: N/A;   IRRIGATION AND DEBRIDEMENT FOOT  08/02/2022   Procedure: IRRIGATION AND DEBRIDEMENT RIGHT 5TH METATARSAL FOOT;  Surgeon: Barton Drape, MD;  [facility];  Service: Orthopedics;;   LAMINECTOMY WITH POSTERIOR LATERAL ARTHRODESIS LEVEL 4 N/A 08/30/2022   Procedure: THORACIC FIVE-THORACIC ELEVEN FUSION, THORACIC EIGHT  TRANSPEDICULAR DECOMPRESSION AND PARTIAL CORPECTOMY with O-Arm;  Surgeon: Debby Dorn MATSU, MD;  Location: [facility] OR;  Service: Neurosurgery;  Laterality: N/A;   LEFT HEART CATH AND CORONARY ANGIOGRAPHY N/A 04/12/2023   Procedure: LEFT HEART CATH AND CORONARY ANGIOGRAPHY;  Surgeon: Verlin Lonni BIRCH, MD;  [facility];  Service: Cardiovascular;  Laterality:  N/A;   NASAL ENDOSCOPY Right 06/30/2023   Procedure: ENDOSCOPY, NOSE;  Surgeon: Jesus Oliphant, MD;  Location: [facility] OR;  Service: ENT;  Laterality: Right;   PERIPHERAL VASCULAR BALLOON ANGIOPLASTY  09/02/2021   Procedure: PERIPHERAL VASCULAR BALLOON ANGIOPLASTY;  Surgeon: Serene Gaile ORN, MD;  [facility];  Service: Cardiovascular;;   PERIPHERAL VASCULAR BALLOON ANGIOPLASTY  12/28/2021   Procedure: PERIPHERAL VASCULAR BALLOON ANGIOPLASTY;  Surgeon: Serene Gaile ORN, MD;  [facility];  Service: Cardiovascular;;  Posterior Tibial PTA only   POLYPECTOMY Right 06/30/2023   Procedure: POLYPECTOMY, NASAL CAVITY;  Surgeon: Jesus Oliphant, MD;  Location: [facility] OR;  Service: ENT;  Laterality: Right;   TEE WITHOUT CARDIOVERSION N/A 09/28/2017   Procedure: TRANSESOPHAGEAL ECHOCARDIOGRAM (TEE);  Surgeon: Francyne Headland, MD;  Location: [facility] ENDOSCOPY;  Service: Cardiovascular;  Laterality: N/A;   TEE WITHOUT CARDIOVERSION N/A 09/03/2021   Procedure: TRANSESOPHAGEAL ECHOCARDIOGRAM (TEE);  Surgeon: Raford Riggs, MD;  Location: [facility] ENDOSCOPY;  Service: Cardiovascular;  Laterality: N/A;   TEE WITHOUT CARDIOVERSION N/A 07/15/2022   Procedure: TRANSESOPHAGEAL ECHOCARDIOGRAM;  Surgeon: Francyne Headland, MD;  [facility];  Service: Cardiovascular;  Laterality: N/A;   TEE WITHOUT CARDIOVERSION N/A 04/18/2023   Procedure: TRANSESOPHAGEAL ECHOCARDIOGRAM (TEE);  Surgeon: Lucas Dorise POUR, MD;  Location: [facility] OR;  Service: Open Heart Surgery;  Laterality: N/A;   TOTAL HIP ARTHROPLASTY Left 07/14/2023   Procedure: ARTHROPLASTY, HIP, TOTAL, ANTERIOR APPROACH;  Surgeon: Kendal Franky SQUIBB, MD;  Location: [facility];  Service: Orthopedics;  Laterality: Left;   Patient Active Problem List   Diagnosis Date Noted   Left displaced femoral neck fracture (HCC) 07/13/2023   Closed left femoral fracture (HCC) 07/12/2023   History of CHF (congestive heart failure) 07/12/2023   Chronic kidney disease  (CKD), stage III (moderate) (HCC) 07/12/2023   Hyperlipidemia 07/12/2023   BPH (benign prostatic hyperplasia) 07/12/2023   Peripheral neuropathy 07/12/2023   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   Bradycardia 04/14/2023   NSTEMI (non-ST elevated myocardial infarction) (HCC) 04/13/2023   Chronic bilateral low back pain 04/13/2023   History of CABG in  04/2023 04/12/2023   Acute decompensated heart failure (HCC) 04/10/2023   Chronic pain syndrome 04/09/2023   MRSA infection greater than 3 months ago 04/09/2023   Hypoglycemia 10/06/2022   Pleural effusion 09/29/2022   Chronic systolic CHF (congestive heart failure) (HCC) 09/29/2022   Acute urinary retention 09/29/2022   Anxiety 09/29/2022   Hardware complicating wound infection (HCC) 09/25/2022   Discitis of thoracic region 09/25/2022   Hypokalemia 09/07/2022   Acute toxic encephalopathy 09/07/2022   Acute postoperative anemia due to expected blood loss 09/07/2022   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   Adjustment disorder with mixed anxiety and depressed mood 08/13/2022   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 08/08/2022   MRSA bacteremia 08/08/2022   AKI (acute kidney injury), ruled out 07/27/2022   Discitis thoracic region 07/27/2022   Bacteremia due to methicillin resistant Staphylococcus aureus 07/27/2022   Severe protein-calorie malnutrition (HCC) 07/26/2022   PVD (peripheral vascular disease) (HCC) 07/26/2022   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   Bacteremia 07/15/2022   Atelectasis 07/12/2022  Sepsis due to pneumonia (HCC) 07/12/2022   Acute on chronic diastolic (congestive) heart failure (HCC) 07/12/2022   Gangrene of toe of right foot (HCC)    Sepsis (HCC) 08/29/2021   SIRS (systemic inflammatory response syndrome) (HCC) 08/29/2021   Hyponatremia 08/29/2021   Hypomagnesemia 08/29/2021   Pure hypercholesterolemia 06/17/2021   Pain of left hand 09/06/2019   Cellulitis 08/26/2019   Persistent atrial fibrillation (HCC) 08/26/2019    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) 10/18/2018   Transaminitis 10/17/2018   Hyperbilirubinemia 10/17/2018   Leukocytosis 10/17/2018   Sinusitis 10/17/2018   Chronic anticoagulation 09/30/2017   Smoker 09/30/2017   Cardiomyopathy (HCC) 09/30/2017   Sleep apnea suspected 09/30/2017   Essential hypertension 09/26/2017   Insulin  dependent type 2 diabetes mellitus (HCC) 09/26/2017   Permanent atrial fibrillation (HCC) 09/26/2017    PCP: Delbert Clam, MD  REFERRING PROVIDER: Kendal Franky SQUIBB, MD  REFERRING DIAG: [phone] (ICD-10-CM) - Status post total hip replacement, left  THERAPY DIAG:  Difficulty in walking, not elsewhere classified  Stiffness of left hip, not elsewhere classified  Pain in left hip  Unsteadiness on feet  Muscle weakness (generalized)  Rationale for Evaluation and Treatment: Rehabilitation  ONSET DATE: 09/26/2023  SUBJECTIVE:   SUBJECTIVE STATEMENT:   Pt's first time back from the ED after being diagnosed for having a foot fracture. He doesn't appear too concerned about his recent injury, however is monitoring both feet for skin quality and sensation. Pt reports he is looking at it daily, podiatrist trims pt nails every 3 months. Pt reports walking around for over 1 hour at the furniture store without pain, however felt sore the next day.   EVAL: Patient had series of orthopedic issues. He was in PT for back pain when he was found to have MRSA infection in spine May 2024, surgery followed for fusion/reconstruction, January had CABG, was still on cane once he got home and fell in bathroom and suffered hip fracture on left with subsequent THA. His THA was on 07/14/23. He was in rehab for several weeks and is now home. He felt he did not get aggressive enough PT in rehab. He then had home health and only did this 1 time per week. He did his exercises and is very motivated to get his function back. He is unsure if he will return to work.  He is concerned about the physicality of the job with all that he has endured. He states this all depends on how much function he gets back. His goal is to be able to walk without the cane and build some upper body strength to be able to function at the level he did before and do his job.   PERTINENT HISTORY: Left LE neural impairment since back surgery; right toe amputations See above for extensive medical history  PAIN: 10/30/23 Are you having pain? Yes: NPRS scale: 1-2/10 Pain location: left hip Pain description: aching / stiff Aggravating factors: sitting Relieving factors: moving  PRECAUTIONS: Fall  RED FLAGS: None   WEIGHT BEARING RESTRICTIONS: No  FALLS:  Has patient fallen in last 6 months? Yes. Number of falls 1  LIVING ENVIRONMENT: Lives with: lives with their spouse Lives in: House/apartment Stairs: No Has following equipment at home: Single point cane  OCCUPATION: chef  PLOF: Independent, Independent with basic ADLs, Independent with household mobility without device, Independent with community mobility without device, Independent with homemaking with ambulation, Independent with gait, and Independent with transfers  PATIENT GOALS: To be  able to walk without the cane and build some upper body strength to be able to function at the level he did before and do his job.    NEXT MD VISIT: 2 months  OBJECTIVE:  Note: Objective measures were completed at Evaluation unless otherwise noted.  DIAGNOSTIC FINDINGS: na  PATIENT SURVEYS:  Eval:  LEFS : 30/80= 50%  COGNITION: Overall cognitive status: Within functional limits for tasks assessed     SENSATION: WFL   MUSCLE LENGTH: Hips tight bilaterally in hip flexors, quads and rotation both IR and ER   Left > Right  POSTURE: rounded shoulders, forward head, decreased lumbar lordosis, posterior pelvic tilt, and flexed trunk    LOWER EXTREMITY ROM:  WFL  LOWER EXTREMITY MMT:  MMT Right eval Left eval  Hip  flexion  4  Hip extension  4-  Hip abduction  4  Hip adduction  4  Hip internal rotation  4  Hip external rotation  4  Knee flexion  4  Knee extension  4+  Ankle dorsiflexion    Ankle plantarflexion    Ankle inversion    Ankle eversion     (Blank rows = not tested)   FUNCTIONAL TESTS:  Eval: 5 times sit to stand: 15.14 sec (no UE use) Timed up and go (TUG): 14.00 sec  GAIT: Distance walked: 50 feet Assistive device utilized: Single point cane Level of assistance: SBA Comments: antalgic/guarded/ slow                                                                                                                                TREATMENT DATE:   10/30/23 Seated hamstring 2x20 sec each side NuStep went up to level 6 for 5 min- Stood by pt to discuss progress/ monitored Leg press seat 8 2x10 80 # 2x15 Standing cone taps (two cones) at barre 2x 5 each side for single limb balance challenge PPT 3x8 in supine to increase lumbopelvic stability Glute Bridges in supine 2x10 Quad set 1x15 both sides Education- self care with bilateral feet due to diabetic neuropathy; mental health awareness and discussed medical trauma pt is working through   10/25/23 Nustep level 5 for 8 min  PPT 2x10 in supine to increase lumbopelvic stability Glute Bridges in supine 2x10 Seated marches 2x10 bil 2.5 lbs Side steps at barre with 2.5 lbs 2x10 Standing marches at barre with 2.5 lbs 2x8 Standing Paloff press at [employer] machine 5 lbs  2x 10 each side to increase abdominal strength  Quad set 2x10 each side Leg press seat 8 2x10 80 # 2x15  10/23/23 Nustep level 4 for 8 min PPT 2x10 in supine to increase lumbopelvic stability Glute bridges in supine 3x8  Seated marches 3x10 with 1 lb ankle weights TKE 3x10 red TB Standing heel raises using mirror therapy for external feedback on neuromuscular control 2x10 Standing heel raises with ball squeeze at ankles 2x10 to increase posterior tibialis muscle  strength Leg press seat 8 2x10 70 # 2x15 Resistance band at ankle black DF/Inv/Ev 1x10 on each ankle with noted substitutions from knee only; leg extended resting on PT student's bended knee   Clincal impression: Mr Cofer presents to skilled PT for first time after his recent ED visit for fractured toe. Pt states he does not have pain in that area and is monitoring bilateral feet skin checks daily. Pt was given increased challenge on NuStep to level 6 from level 5, and found it appropriately challenging. Followed by same settings for leg press as last session, still stating he found it challenging. Pt introduced to new balance exercises that promoted weight shifting during single limb movement. He required verbal cues to focus attention on the standing limb for improved balance, to which he demonstrated better standing stability. First set used bars; second set without using bars. Pt updated recent progress in meeting with mental health therapist, and states their appointment is on his calendar. Pt will benefit by continuing skilled PT to address the deficits below and move closer towards his goals.     PATIENT EDUCATION:  Education details: Initiated HEP Person educated: Patient Education method: Programmer, multimedia, Facilities manager, Verbal cues, and Handouts Education comprehension: verbalized understanding and returned demonstration  HOME EXERCISE PROGRAM: Access Code: [email or web address] URL: [email or web address] Date: 10/04/2023 Prepared by: Glade Pesa  Exercises - Squat with Chair Touch  - 1 x daily - 7 x weekly - 4 sets - 5 reps - Side Stepping with Resistance at Ankles  - 1 x daily - 7 x weekly - 3 sets - 10 reps - Seated Hip Abduction with Resistance  - Brison pushes himself a lot while exercising in the water, possibly pushes himself too hard. Increased workload today by increasing reps and difficulty of each exercise.. Qtip provided less support with standing activities. Sit to stand  exercise extremely difficult.     OBJECTIVE IMPAIRMENTS: Abnormal gait, cardiopulmonary status limiting activity, decreased activity tolerance, decreased balance, decreased endurance, difficulty walking, decreased ROM, decreased strength, increased fascial restrictions, increased muscle spasms, impaired flexibility, impaired UE functional use, improper body mechanics, postural dysfunction, and pain.   ACTIVITY LIMITATIONS: carrying, lifting, bending, sitting, standing, squatting, sleeping, stairs, transfers, bed mobility, bathing, toileting, dressing, reach over head, hygiene/grooming, and caring for others  PARTICIPATION LIMITATIONS: meal prep, cleaning, laundry, driving, shopping, community activity, occupation, yard work, and church  PERSONAL FACTORS: Fitness, Past/current experiences, Profession, Time since onset of injury/illness/exacerbation, and 3+ comorbidities: spine surgery, CABG, diabetes and Htn are also affecting patient's functional outcome.   REHAB POTENTIAL: Good  CLINICAL DECISION MAKING: Unstable/unpredictable  EVALUATION COMPLEXITY: High   GOALS: Goals reviewed with patient? Yes  SHORT TERM GOALS: Target date: 10/26/2023  Pain report to be no greater than 4/10  Baseline: Goal status: Ongoing  2.  Patient will be independent with initial HEP  Baseline:  Goal status: Met on 10/18/23  3.  Patient to be able to ambulate 50 feet without a.d Baseline:  Goal status: Ongoing  4.  Patient to be able to do 10 sit to stand without UE support  Baseline:  Goal status: Ongoing    LONG TERM GOALS: Target date: 11/23/2023  Patient to report pain no greater than 2/10  Baseline:  Goal status: Progressing   2.  Patient to be independent with advanced HEP  Baseline:  Goal status: ongoing   3.  Patient to be able to stand or walk for at least 15 min without pain or need to rest  Baseline:  Goal status: INITIAL  4.  Patient to be able to ascend and descend 12 steps  safely Baseline:  Goal status: Ongoing   5.  Patient to report 85% improvement in overall symptoms and function Baseline:  Goal status: Ongoing  6.  Patient to be able to walk 500 feet without a.d. safely without rest breaks Baseline:  Goal status: Ongoing    PLAN:  PT FREQUENCY: 3x/week  PT DURATION: 8 weeks  PLANNED INTERVENTIONS: 97110-Therapeutic exercises, 97530- Therapeutic activity, 97112- Neuromuscular re-education, 97535- Self Care, [id number]- Manual therapy, [phone]- Gait training, [phone]- Aquatic Therapy, [phone]- Electrical stimulation (unattended), [phone]- Electrical stimulation (manual), [id number]- Vasopneumatic device, 97035- Ultrasound, [id number]- Ionotophoresis 4mg /ml Dexamethasone , Patient/Family education, Balance training, Stair training, Taping, Joint mobilization, Spinal mobilization, Cryotherapy, and Moist heat  PLAN FOR NEXT SESSION: Nustep, continue functional strength training: he needs higher level functional training; update HEP, progress balance exercises  Lavanda Cleverly, SPT 10/30/23 1:37 PM   I agree with the following treatment note after reviewing documentation. This session was performed under the supervision of a licensed clinician.  Jarrell Laming, PT, DPT 10/30/23, 1:37 PM   [facility] [address], [address] [place], [place] [address] Phone # [phone] Fax [phone]

## 2023-10-31 ENCOUNTER — Other Ambulatory Visit: Payer: Self-pay | Admitting: Licensed Clinical Social Worker

## 2023-10-31 ENCOUNTER — Other Ambulatory Visit: Payer: Self-pay

## 2023-10-31 ENCOUNTER — Other Ambulatory Visit: Payer: Self-pay | Admitting: Family Medicine

## 2023-10-31 ENCOUNTER — Other Ambulatory Visit (HOSPITAL_COMMUNITY): Payer: Self-pay

## 2023-10-31 DIAGNOSIS — Z794 Long term (current) use of insulin: Secondary | ICD-10-CM

## 2023-10-31 NOTE — Patient Outreach (Signed)
 Complex Care Management   Visit Note  10/31/2023  Name:  David [name] MRN: [id number] DOB: 1960-05-01  Situation: Referral received for Complex Care Management related to mental health needs I obtained verbal consent from Patient.  Visit completed with patient  on the phone  Background:   Past Medical History:  Diagnosis Date   Cardiomyopathy (HCC)    a. EF 45% in 2019.   CHF (congestive heart failure) (HCC)    Chronic anticoagulation 09/30/2017   Diabetes mellitus type 2 in nonobese [facility])    Discitis of thoracic region 09/25/2022   Does not have health insurance    Essential hypertension 09/26/2017   Financial difficulties    H/O noncompliance with medical treatment, presenting hazards to health    Hardware complicating wound infection (HCC) 09/25/2022   Non-insulin  treated type 2 diabetes mellitus (HCC) 09/26/2017   Persistent atrial fibrillation (HCC)    Sleep apnea suspected 09/30/2017   years ago- not anymore per patient    Assessment: Patient Reported Symptoms:  Cognitive Cognitive Status: Alert and oriented to person, place, and time Cognitive/Intellectual Conditions Management [RPT]: None reported or documented in medical history or problem list   Health Maintenance Behaviors: Stress management Health Facilitated by: Rest, Stress management  Neurological Neurological Review of Symptoms: Weakness Neurological Management Strategies: Adequate rest, Coping strategies  HEENT HEENT Symptoms Reported: No symptoms reported HEENT Management Strategies: Coping strategies    Cardiovascular Cardiovascular Symptoms Reported: Fatigue Does patient have uncontrolled Hypertension?: No Cardiovascular Management Strategies: Coping strategies  Respiratory Respiratory Symptoms Reported: No symptoms reported Respiratory Management Strategies: Adequate rest, Coping strategies  Endocrine Endocrine Symptoms Reported: Irritability, Weakness or fatigue Is patient diabetic?: Yes     Gastrointestinal Gastrointestinal Symptoms Reported: No symptoms reported Gastrointestinal Management Strategies: Coping strategies    Genitourinary Genitourinary Symptoms Reported: Frequency Genitourinary Management Strategies: Adequate rest, Coping strategies  Integumentary Integumentary Symptoms Reported: No symptoms reported Skin Management Strategies: Coping strategies  Musculoskeletal Musculoskelatal Symptoms Reviewed: Difficulty walking, Weakness, Unsteady gait Musculoskeletal Management Strategies: Coping strategies  Uses a cane to help him walk    Psychosocial Psychosocial Symptoms Reported: Anxiety - if selected complete GAD, Sadness - if selected complete PHQ 2-9, Depression - if selected complete PHQ 2-9 Behavioral Management Strategies: Coping strategies, Adequate rest Major Change/Loss/Stressor/Fears (CP): Medical condition, self Techniques to Cope with Loss/Stress/Change: Diversional activities, Meditation, Counseling Quality of Family Relationships: supportive Do you feel physically threatened by others?: No      10/31/2023    4:04 PM  Depression screen PHQ 2/9  Decreased Interest 1  Down, Depressed, Hopeless 1  PHQ - 2 Score 2  Altered sleeping 1  Tired, decreased energy 1  Change in appetite 1  Feeling bad or failure about yourself  1  Trouble concentrating 1  Moving slowly or fidgety/restless 1  Suicidal thoughts 0  PHQ-9 Score 8  Difficult doing work/chores Somewhat difficult    Vitals:   BP in normal range, per client information  Medications Reviewed Today     Reviewed by Frances Ozell GORMAN KEN (Social Worker) on 10/31/23 at 1553  Med List Status: <None>   Medication Order Taking? Sig Documenting Provider Last Dose Status Informant  ALPRAZolam  (XANAX ) 0.5 MG tablet [id number] Not taking  Take 1 tablet (0.5 mg total) by mouth at bedtime as needed for anxiety or sleep.  Patient not taking: Reported on 10/31/2023   Elgergawy, Brayton GORMAN, MD  Active    amiodarone  (PACERONE ) 200 MG tablet [id number] unknown Take 1 tablet (200  mg total) by mouth daily for 10 days. Elgergawy, Brayton RAMAN, MD  Expired 09/25/23 2359   apixaban  (ELIQUIS ) 5 MG TABS tablet [id number] Yes Take 1 tablet (5 mg total) by mouth 2 (two) times daily. Nahser, Aleene PARAS, MD  Active Self, Pharmacy Records  carvedilol  (COREG ) 6.25 MG tablet [id number] Yes Take 1 tablet (6.25 mg total) by mouth 2 (two) times daily. Lucien Orren SAILOR, PA-C  Active Self, Pharmacy Records  cephALEXin  (KEFLEX ) 500 MG capsule [id number] Yes Take 1 capsule (500 mg total) by mouth 4 (four) times daily. Prosperi, Christian H, PA-C  Active   Continuous Glucose Sensor (DEXCOM G7 SENSOR) MISC [id number] unknown Use as directed & change every 10 days. Newlin, Enobong, MD  Active Self, Pharmacy Records  dapagliflozin  propanediol (FARXIGA ) 10 MG TABS tablet [id number] Yes Take 1 tablet (10 mg total) by mouth daily before breakfast. Delbert Clam, MD  Active Self, Pharmacy Records  docusate sodium  (COLACE) 100 MG capsule [id number] Not taking Take 2 capsules (200 mg total) by mouth 2 (two) times daily.  Patient not taking: Reported on 10/31/2023   Elgergawy, Brayton RAMAN, MD  Active   doxycycline  (VIBRA -TABS) 100 MG tablet [id number] Yes Take 1 tablet (100 mg total) by mouth every 12 (twelve) hours. Dennise Kingsley, MD  Active Self, Pharmacy Records             Evolocumab  (REPATHA  SURECLICK) 140 MG/ML EMMANUEL [id number] Yes Inject 140 mg into the skin every 14 (fourteen) days. Nishan, Peter C, MD  Active   famotidine  (PEPCID ) 20 MG tablet [id number] Yes Take 1 tablet (20 mg total) by mouth 2 times daily. Newlin, Enobong, MD  Active   furosemide  (LASIX ) 40 MG tablet [id number] Yes Take 1 tablet (40 mg total) by mouth 2 (two) times daily. Newlin, Enobong, MD  Active   HYDROcodone -acetaminophen  (NORCO) 10-325 MG tablet [id number] Yes Take 1 tablet by mouth every 4 (four) hours as needed for moderate pain (pain score 4-6). Elgergawy, Brayton RAMAN,  MD  Active   icosapent  Ethyl (VASCEPA ) 1 g capsule [id number] unknown Take 2 capsules (2 g total) by mouth 2 (two) times daily. Nishan, Peter C, MD  Active   insulin  aspart (NOVOLOG ) 100 UNIT/ML injection [id number] taking Inject 0-15 Units into the skin 3 (three) times daily with meals. insulin  aspart (novoLOG ) injection 0-15 Units  0-15 Units, Subcutaneous, 3 times daily with meals, First dose on Tue 07/18/23 at 1200 Correction coverage: Moderate (average weight, post-op) CBG < 70: Implement Hypoglycemia Standing Orders and refer to Hypoglycemia Standing Orders sidebar report CBG 70 - 120: 0 units CBG 121 - 150: 2 units CBG 151 - 200: 3 units CBG 201 - 250: 5 units CBG 251 - 300: 8 units CBG 301 - 350: 11 units CBG 351 - 400: 15 units Elgergawy, Brayton RAMAN, MD  Active   insulin  glargine (LANTUS  SOLOSTAR) 100 UNIT/ML Solostar Pen [id number] Yes Inject 27 Units into the skin daily. Newlin, Enobong, MD  Active   Insulin  Pen Needle (PEN NEEDLES) 32G X 4 MM MISC [id number] taking Use to inject insulin  once daily. Newlin, Enobong, MD  Active   methocarbamol  (ROBAXIN ) 750 MG tablet [id number] Not taking Take 1 tablet (750 mg total) by mouth every 8 (eight) hours as needed for muscle spasms.  Patient not taking: Reported on 10/31/2023   Newlin, Enobong, MD  Active   naloxone  (NARCAN ) nasal spray 4 mg/0.1 mL [id number] Yes Opiates overdose  Patient taking differently: Place 1 spray into the nose once. Opiates  overdose   Akula, Vijaya, MD  Active Self, Pharmacy Records  OXYCONTIN  10 MG 12 hr tablet [id number] Yes Take 1 tablet (10 mg total) by mouth every 12 (twelve) hours. Elgergawy, Brayton RAMAN, MD  Active   polyethylene glycol (MIRALAX  / GLYCOLAX ) 17 g packet [id number] Yes Mix 1 packet (17 g) in beverage and take by mouth 2 (two) times daily.  Patient taking differently: Take 17 g by mouth every other day.   McClung, Angela M, PA-C  Active Self, Pharmacy Records  Potassium Chloride  ER 20 MEQ TBCR [id number] Yes Take 1  tablet (20 mEq total) by mouth daily. Newlin, Enobong, MD  Active   pregabalin  (LYRICA ) 75 MG capsule [id number] Yes Take 1 capsule (75 mg total) by mouth 2 (two) times daily. Newlin, Enobong, MD  Active   rosuvastatin  (CRESTOR ) 20 MG tablet [id number] Yes Take 1 tablet (20 mg total) by mouth daily. Nishan, Peter C, MD  Active   tamsulosin  (FLOMAX ) 0.4 MG CAPS capsule [id number] Yes Take 1 capsule (0.4 mg total) by mouth daily after supper. Newlin, Enobong, MD  Active   terbinafine  (LAMISIL ) 250 MG tablet [id number] unknown Take 1 tablet (250 mg total) by mouth daily. Newlin, Enobong, MD  Active   Vitamin D , Ergocalciferol , (DRISDOL ) 1.25 MG (50000 UNIT) CAPS capsule [id number] Not taking Take 1 capsule (50,000 Units total) by mouth every 7 (seven) days.  Patient not taking: Reported on 10/31/2023   Danton Lauraine LABOR, PA-C  Active             Recommendation:   PCP Follow-up Continue Current Plan of Care Take medications as prescribed Contact mental health agencies discussed to inquire about possible counseling support for client Discuss mental health support with PCP as needed  Engage in hobbies to help with stress reduction Call LCSW as needed for SW support  Follow Up Plan:   LCSW has provided client with LCSW name and phone number and encouraged client to call LCSW as needed for SW support   Glendia Pear  MSW, LCSW [place] Licensed Clinical Social Worker Direct Dial :  [phone] Fax:  [phone] Website:  [email or web address]

## 2023-10-31 NOTE — Patient Instructions (Signed)
 Visit Information  Thank you for taking time to visit with me today. Please don't hesitate to contact me if I can be of assistance to you before our next scheduled appointment.  LCSW has provided client with LCSW name and phone number and encouraged client to call LCSW as needed for SW support.   Please call the care guide team at [phone] if you need to cancel or reschedule your appointment.   Following is a copy of your care plan:   Goals Addressed             This Visit's Progress    VBCI Social Work Care Plan       Problems:   Transport needs             Numerous medical needs; financial needs             Functional challenges             Depression; low self esteem               CSW Clinical Goal(s):   Over the next 30  days the Patient will attend all scheduled medical appointments as evidenced by patient report and care team review of appointment completion in electronic medical record.             Over next 30 days client will contact mental health agencies of choice to determine agency where he would like to begin getting counseling support AEB patient report of contacting such agencies for potential help.   Interventions:  Spoke with client about client needs and status.  Discussed financial needs of client. Discussed housing needs of client. Discussed mental health needs of client            Discussed [facility] in [place], [place]. Discussed [facility] in [place].  LCSW provided client with names and phone numbers of these agencies             Discussed support from his spouse. She does work during the day            Client shared medical history of multiple surgeries.               He is walking now with a cane.  He cannot walk long distances at one time             Discussed hobbies; he likes to listen to music and collect coins             Discussed sleeping issues; discussed pain issues.  He  takes pain medication as prescribed             Provided counseling support              Client spoke in rapid cadence , almost as if he could not get words out fast enough. He said he has racing thoughts and he believes that counseling could be helpful to him at this time.  Client plans to call two counseling agencies above and see if he might be able to go to counseling sessions at one of these agencies               Discussed medication procurement             Used Active Listening techniques to allow client to share feelings and vent emotions felt. He spoke of functional decline and low self esteem. He wants to provide  financially for household             Discussed program support             Encouraged client to call LCSW as needed for SW support  Patient Goals/Self-Care Activities:  Continue taking your medication as prescribed.                Attend medical appointments as scheduled              Contact 2 mental health agencies above to inquire about possible counseling support for client               Call LCSW as needed for SW support   Plan:   Client has been provided LCSW name and phone number and will call LCSW as needed for SW support.         Please go to [facility] [address], [place] [phone]) if you are experiencing a Mental Health or Behavioral Health Crisis or need someone to talk to.  The patient verbalized understanding of instructions, educational materials, and care plan provided today and DECLINED offer to receive copy of patient instructions, educational materials, and care plan.    Glendia Pear  MSW, LCSW [place]/[facility] Licensed Clinical Social Worker Direct Dial :  [phone] Fax:  [phone] Website:  [email or web address]

## 2023-11-01 ENCOUNTER — Other Ambulatory Visit (HOSPITAL_COMMUNITY): Payer: Self-pay

## 2023-11-01 ENCOUNTER — Encounter: Payer: Self-pay | Admitting: Rehabilitative and Restorative Service Providers"

## 2023-11-01 ENCOUNTER — Ambulatory Visit: Admitting: Rehabilitative and Restorative Service Providers"

## 2023-11-01 DIAGNOSIS — R2681 Unsteadiness on feet: Secondary | ICD-10-CM

## 2023-11-01 DIAGNOSIS — R252 Cramp and spasm: Secondary | ICD-10-CM

## 2023-11-01 DIAGNOSIS — M25552 Pain in left hip: Secondary | ICD-10-CM | POA: Diagnosis not present

## 2023-11-01 DIAGNOSIS — R293 Abnormal posture: Secondary | ICD-10-CM

## 2023-11-01 DIAGNOSIS — M25652 Stiffness of left hip, not elsewhere classified: Secondary | ICD-10-CM

## 2023-11-01 DIAGNOSIS — M6281 Muscle weakness (generalized): Secondary | ICD-10-CM

## 2023-11-01 DIAGNOSIS — R262 Difficulty in walking, not elsewhere classified: Secondary | ICD-10-CM

## 2023-11-01 MED ORDER — GLUCOSE BLOOD VI STRP
ORAL_STRIP | 6 refills | Status: AC
  Filled 2023-11-01: qty 100, 33d supply, fill #0
  Filled 2024-02-01: qty 100, 33d supply, fill #1
  Filled 2024-02-05: qty 100, 33d supply, fill #0

## 2023-11-01 MED ORDER — ACCU-CHEK SOFTCLIX LANCETS MISC
6 refills | Status: AC
  Filled 2023-11-01: qty 100, 33d supply, fill #0
  Filled 2024-02-01: qty 100, 33d supply, fill #1

## 2023-11-01 NOTE — Therapy (Signed)
 OUTPATIENT PHYSICAL THERAPY TREATMENT NOTE   Patient Name: David [name] MRN: [id number] DOB:11-07-1960, 63 y.o., male Today's Date: 11/01/2023  END OF SESSION:  PT End of Session - 11/01/23 1239     Visit Number 9    Date for PT Re-Evaluation 11/23/23    Authorization Type Medicaid no auth 27 visits    PT Start Time 1230    PT Stop Time 1310    PT Time Calculation (min) 40 min    Activity Tolerance Patient tolerated treatment well    Behavior During Therapy WFL for tasks assessed/performed                Past Medical History:  Diagnosis Date   Cardiomyopathy (HCC)    a. EF 45% in 2019.   CHF (congestive heart failure) (HCC)    Chronic anticoagulation 09/30/2017   Diabetes mellitus type 2 in nonobese [facility])    Discitis of thoracic region 09/25/2022   Does not have health insurance    Essential hypertension 09/26/2017   Financial difficulties    H/O noncompliance with medical treatment, presenting hazards to health    Hardware complicating wound infection (HCC) 09/25/2022   Non-insulin  treated type 2 diabetes mellitus (HCC) 09/26/2017   Persistent atrial fibrillation (HCC)    Sleep apnea suspected 09/30/2017   years ago- not anymore per patient   Past Surgical History:  Procedure Laterality Date   ABDOMINAL AORTOGRAM W/LOWER EXTREMITY N/A 09/02/2021   Procedure: ABDOMINAL AORTOGRAM W/LOWER EXTREMITY;  Surgeon: Serene Gaile ORN, MD;  [facility]: [facility];  Service: Cardiovascular;  Laterality: N/A;   ABDOMINAL AORTOGRAM W/LOWER EXTREMITY N/A 12/28/2021   Procedure: ABDOMINAL AORTOGRAM W/LOWER EXTREMITY;  Surgeon: Serene Gaile ORN, MD;  [facility]: [facility];  Service: Cardiovascular;  Laterality: N/A;   ABDOMINAL AORTOGRAM W/LOWER EXTREMITY N/A 07/28/2022   Procedure: ABDOMINAL AORTOGRAM W/LOWER EXTREMITY;  Surgeon: Gretta Lonni PARAS, MD;  [facility];  Service: Cardiovascular;  Laterality: N/A;   AMPUTATION Right 09/04/2021    Procedure: 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;  Surgeon: Harden Jerona GAILS, MD;  Location[facility] OR;  Service: Orthopedics;  Laterality: Right;   AMPUTATION Right 08/02/2022   Procedure: AMPUTATION RAY 5TH METATARSAL OF RIGHT FOOT;  Surgeon: Barton Drape, MD;  Location: [facility] OR;  Service: Orthopedics;  Laterality: Right;   APPENDECTOMY  1971   BONE BIOPSY Right 08/02/2022   Procedure: BONE BIOPSY OF 5TH METATARSAL RIGHT FOOT;  Surgeon: Barton Drape, MD;  [facility]: [facility];  Service: Orthopedics;  Laterality: Right;   CARDIOVERSION N/A 09/28/2017   Procedure: CARDIOVERSION;  Surgeon: Francyne Headland, MD;  [facility];  Service: Cardiovascular;  Laterality: N/A;   CLIPPING OF ATRIAL APPENDAGE N/A 04/18/2023   Procedure: CLIPPING OF ATRIAL APPENDAGE USING ATRICURE ATRICLIP [id number] with pulmonary vein isolation ablation;  Surgeon: Lucas Dorise POUR, MD;  Location: [facility] OR;  Service: Open Heart Surgery;  Laterality: N/A;   CORONARY ARTERY BYPASS GRAFT N/A 04/18/2023   Procedure: CORONARY ARTERY BYPASS GRAFTING (CABG) TIMES FOUR USING LEFT INTERNAL MAMMARY ARTERY AND ENDOSCOPICALLY HARVESTED LEFT GREATER SAPHENOUS VEIN;  Surgeon: Lucas Dorise POUR, MD;  [facility];  Service: Open Heart Surgery;  Laterality: N/A;   IRRIGATION AND DEBRIDEMENT FOOT  08/02/2022   Procedure: IRRIGATION AND DEBRIDEMENT RIGHT 5TH METATARSAL FOOT;  Surgeon: Barton Drape, MD;  [facility];  Service: Orthopedics;;   LAMINECTOMY WITH POSTERIOR LATERAL ARTHRODESIS LEVEL 4 N/A 08/30/2022   Procedure: THORACIC FIVE-THORACIC ELEVEN FUSION, THORACIC EIGHT  TRANSPEDICULAR DECOMPRESSION AND PARTIAL CORPECTOMY with O-Arm;  Surgeon: Debby Dorn MATSU, MD;  Location: [facility] OR;  Service: Neurosurgery;  Laterality: N/A;   LEFT HEART CATH AND CORONARY ANGIOGRAPHY N/A 04/12/2023   Procedure: LEFT HEART CATH AND CORONARY ANGIOGRAPHY;  Surgeon: Verlin Lonni BIRCH, MD;  [facility];  Service: Cardiovascular;  Laterality:  N/A;   NASAL ENDOSCOPY Right 06/30/2023   Procedure: ENDOSCOPY, NOSE;  Surgeon: Jesus Oliphant, MD;  Location[facility] OR;  Service: ENT;  Laterality: Right;   PERIPHERAL VASCULAR BALLOON ANGIOPLASTY  09/02/2021   Procedure: PERIPHERAL VASCULAR BALLOON ANGIOPLASTY;  Surgeon: Serene Gaile ORN, MD;  [facility];  Service: Cardiovascular;;   PERIPHERAL VASCULAR BALLOON ANGIOPLASTY  12/28/2021   Procedure: PERIPHERAL VASCULAR BALLOON ANGIOPLASTY;  Surgeon: Serene Gaile ORN, MD;  [facility];  Service: Cardiovascular;;  Posterior Tibial PTA only   POLYPECTOMY Right 06/30/2023   Procedure: POLYPECTOMY, NASAL CAVITY;  Surgeon: Jesus Oliphant, MD;  Location: [facility] OR;  Service: ENT;  Laterality: Right;   TEE WITHOUT CARDIOVERSION N/A 09/28/2017   Procedure: TRANSESOPHAGEAL ECHOCARDIOGRAM (TEE);  Surgeon: Francyne Headland, MD;  Location: [facility] ENDOSCOPY;  Service: Cardiovascular;  Laterality: N/A;   TEE WITHOUT CARDIOVERSION N/A 09/03/2021   Procedure: TRANSESOPHAGEAL ECHOCARDIOGRAM (TEE);  Surgeon: Raford Riggs, MD;  Location: [facility] ENDOSCOPY;  Service: Cardiovascular;  Laterality: N/A;   TEE WITHOUT CARDIOVERSION N/A 07/15/2022   Procedure: TRANSESOPHAGEAL ECHOCARDIOGRAM;  Surgeon: Francyne Headland, MD;  [facility];  Service: Cardiovascular;  Laterality: N/A;   TEE WITHOUT CARDIOVERSION N/A 04/18/2023   Procedure: TRANSESOPHAGEAL ECHOCARDIOGRAM (TEE);  Surgeon: Lucas Dorise POUR, MD;  Location: [facility] OR;  Service: Open Heart Surgery;  Laterality: N/A;   TOTAL HIP ARTHROPLASTY Left 07/14/2023   Procedure: ARTHROPLASTY, HIP, TOTAL, ANTERIOR APPROACH;  Surgeon: Kendal Franky SQUIBB, MD;  [facility];  Service: Orthopedics;  Laterality: Left;   Patient Active Problem List   Diagnosis Date Noted   Left displaced femoral neck fracture (HCC) 07/13/2023   Closed left femoral fracture (HCC) 07/12/2023   History of CHF (congestive heart failure) 07/12/2023   Chronic kidney disease  (CKD), stage III (moderate) (HCC) 07/12/2023   Hyperlipidemia 07/12/2023   BPH (benign prostatic hyperplasia) 07/12/2023   Peripheral neuropathy 07/12/2023   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   Bradycardia 04/14/2023   NSTEMI (non-ST elevated myocardial infarction) (HCC) 04/13/2023   Chronic bilateral low back pain 04/13/2023   History of CABG in  04/2023 04/12/2023   Acute decompensated heart failure (HCC) 04/10/2023   Chronic pain syndrome 04/09/2023   MRSA infection greater than 3 months ago 04/09/2023   Hypoglycemia 10/06/2022   Pleural effusion 09/29/2022   Chronic systolic CHF (congestive heart failure) (HCC) 09/29/2022   Acute urinary retention 09/29/2022   Anxiety 09/29/2022   Hardware complicating wound infection (HCC) 09/25/2022   Discitis of thoracic region 09/25/2022   Hypokalemia 09/07/2022   Acute toxic encephalopathy 09/07/2022   Acute postoperative anemia due to expected blood loss 09/07/2022   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   Adjustment disorder with mixed anxiety and depressed mood 08/13/2022   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 08/08/2022   MRSA bacteremia 08/08/2022   AKI (acute kidney injury), ruled out 07/27/2022   Discitis thoracic region 07/27/2022   Bacteremia due to methicillin resistant Staphylococcus aureus 07/27/2022   Severe protein-calorie malnutrition (HCC) 07/26/2022   PVD (peripheral vascular disease) (HCC) 07/26/2022   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   Bacteremia 07/15/2022   Atelectasis 07/12/2022  Sepsis due to pneumonia (HCC) 07/12/2022   Acute on chronic diastolic (congestive) heart failure (HCC) 07/12/2022   Gangrene of toe of right foot (HCC)    Sepsis (HCC) 08/29/2021   SIRS (systemic inflammatory response syndrome) (HCC) 08/29/2021   Hyponatremia 08/29/2021   Hypomagnesemia 08/29/2021   Pure hypercholesterolemia 06/17/2021   Pain of left hand 09/06/2019   Cellulitis 08/26/2019   Persistent atrial fibrillation (HCC) 08/26/2019    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) 10/18/2018   Transaminitis 10/17/2018   Hyperbilirubinemia 10/17/2018   Leukocytosis 10/17/2018   Sinusitis 10/17/2018   Chronic anticoagulation 09/30/2017   Smoker 09/30/2017   Cardiomyopathy (HCC) 09/30/2017   Sleep apnea suspected 09/30/2017   Essential hypertension 09/26/2017   Insulin  dependent type 2 diabetes mellitus (HCC) 09/26/2017   Permanent atrial fibrillation (HCC) 09/26/2017    PCP: Delbert Clam, MD  REFERRING PROVIDER: Kendal Franky SQUIBB, MD  REFERRING DIAG: [phone] (ICD-10-CM) - Status post total hip replacement, left  THERAPY DIAG:  Stiffness of left hip, not elsewhere classified  Pain in left hip  Cramp and spasm  Muscle weakness (generalized)  Difficulty in walking, not elsewhere classified  Unsteadiness on feet  Abnormal posture  Rationale for Evaluation and Treatment: Rehabilitation  ONSET DATE: 09/26/2023  SUBJECTIVE:   SUBJECTIVE STATEMENT:   Patient states that he feels stronger each visit.  States that he does not need his cane as much as he did and is walking around his home without his cane now.  EVAL: Patient had series of orthopedic issues. He was in PT for back pain when he was found to have MRSA infection in spine May 2024, surgery followed for fusion/reconstruction, January had CABG, was still on cane once he got home and fell in bathroom and suffered hip fracture on left with subsequent THA. His THA was on 07/14/23. He was in rehab for several weeks and is now home. He felt he did not get aggressive enough PT in rehab. He then had home health and only did this 1 time per week. He did his exercises and is very motivated to get his function back. He is unsure if he will return to work. He is concerned about the physicality of the job with all that he has endured. He states this all depends on how much function he gets back. His goal is to be able to walk without the cane and  build some upper body strength to be able to function at the level he did before and do his job.   PERTINENT HISTORY: Left LE neural impairment since back surgery; right toe amputations See above for extensive medical history  PAIN:  Are you having pain? Yes: NPRS scale: 3/10 Pain location: left hip Pain description: aching / stiff Aggravating factors: sitting Relieving factors: moving  PRECAUTIONS: Fall  RED FLAGS: None   WEIGHT BEARING RESTRICTIONS: No  FALLS:  Has patient fallen in last 6 months? Yes. Number of falls 1  LIVING ENVIRONMENT: Lives with: lives with their spouse Lives in: House/apartment Stairs: No Has following equipment at home: Single point cane  OCCUPATION: chef  PLOF: Independent, Independent with basic ADLs, Independent with household mobility without device, Independent with community mobility without device, Independent with homemaking with ambulation, Independent with gait, and Independent with transfers  PATIENT GOALS: To be able to walk without the cane and build some upper body strength to be able to function at the level he did before and do his job.    NEXT  MD VISIT: 2 months  OBJECTIVE:  Note: Objective measures were completed at Evaluation unless otherwise noted.  DIAGNOSTIC FINDINGS: na  PATIENT SURVEYS:  Eval:  LEFS : 30/80= 50%  COGNITION: Overall cognitive status: Within functional limits for tasks assessed     SENSATION: WFL   MUSCLE LENGTH: Hips tight bilaterally in hip flexors, quads and rotation both IR and ER   Left > Right  POSTURE: rounded shoulders, forward head, decreased lumbar lordosis, posterior pelvic tilt, and flexed trunk    LOWER EXTREMITY ROM:  WFL  LOWER EXTREMITY MMT:  MMT Right eval Left eval  Hip flexion  4  Hip extension  4-  Hip abduction  4  Hip adduction  4  Hip internal rotation  4  Hip external rotation  4  Knee flexion  4  Knee extension  4+  Ankle dorsiflexion    Ankle  plantarflexion    Ankle inversion    Ankle eversion     (Blank rows = not tested)   FUNCTIONAL TESTS:  Eval: 5 times sit to stand: 15.14 sec (no UE use) Timed up and go (TUG): 14.00 sec  GAIT: Distance walked: 50 feet Assistive device utilized: Single point cane Level of assistance: SBA Comments: antalgic/guarded/ slow                                                                                                                                TREATMENT DATE:   11/01/2023: Nustep level 5 x6 min with PT present to discuss status Standing hamstring stretch at stairs 2x20 sec bilat FWD step ups/through to next step x10 bilat Standing calf stretch on slanted rocker board 2x20 sec FWD step over 3 hurdles by barre down and back x3 laps Side step over 3 hurdles by barre down and back x3 laps Seated hamstring curl with blue theraband 2x10 bilat Standing rows with blue theraband with marching 2x5 bilat Standing alt toe taps to 2nd step 2x10   10/30/23 Seated hamstring 2x20 sec each side NuStep went up to level 6 for 5 min- Stood by pt to discuss progress/ monitored Leg press seat 8 2x10 80 # 2x15 Standing cone taps (two cones) at barre 2x 5 each side for single limb balance challenge PPT 3x8 in supine to increase lumbopelvic stability Glute Bridges in supine 2x10 Quad set 1x15 both sides Education- self care with bilateral feet due to diabetic neuropathy; mental health awareness and discussed medical trauma pt is working through   10/25/23 Nustep level 5 for 8 min  PPT 2x10 in supine to increase lumbopelvic stability Glute Bridges in supine 2x10 Seated marches 2x10 bil 2.5 lbs Side steps at barre with 2.5 lbs 2x10 Standing marches at barre with 2.5 lbs 2x8 Standing Paloff press at [employer] machine 5 lbs  2x 10 each side to increase abdominal strength  Quad set 2x10 each side Leg press seat 8 2x10 80 # 2x15    Clincal  impression: Mr Carignan presents to skilled PT stating that  his toe is doing better, but he is going to the podiatrist for a follow up today.  Patient with great motivation and reports feeling better today and he was able to progress with more strengthening exercises today.  Patient did not use SPC during ambulation in the clinic.  Patient with difficulty with stepping over small hurdles and requires occasional UE support of barre for balance.  Patient continues to require skilled PT to progress towards goal related activities.     PATIENT EDUCATION:  Education details: Initiated HEP Person educated: Patient Education method: Programmer, multimedia, Facilities manager, Verbal cues, and Handouts Education comprehension: verbalized understanding and returned demonstration  HOME EXERCISE PROGRAM: Access Code: [email or web address] URL: [email or web address] Date: 10/04/2023 Prepared by: Glade Pesa  Exercises - Squat with Chair Touch  - 1 x daily - 7 x weekly - 4 sets - 5 reps - Side Stepping with Resistance at Ankles  - 1 x daily - 7 x weekly - 3 sets - 10 reps - Seated Hip Abduction with Resistance  - Alek pushes himself a lot while exercising in the water, possibly pushes himself too hard. Increased workload today by increasing reps and difficulty of each exercise.. Qtip provided less support with standing activities. Sit to stand exercise extremely difficult.     OBJECTIVE IMPAIRMENTS: Abnormal gait, cardiopulmonary status limiting activity, decreased activity tolerance, decreased balance, decreased endurance, difficulty walking, decreased ROM, decreased strength, increased fascial restrictions, increased muscle spasms, impaired flexibility, impaired UE functional use, improper body mechanics, postural dysfunction, and pain.   ACTIVITY LIMITATIONS: carrying, lifting, bending, sitting, standing, squatting, sleeping, stairs, transfers, bed mobility, bathing, toileting, dressing, reach over head, hygiene/grooming, and caring for others  PARTICIPATION LIMITATIONS:  meal prep, cleaning, laundry, driving, shopping, community activity, occupation, yard work, and church  PERSONAL FACTORS: Fitness, Past/current experiences, Profession, Time since onset of injury/illness/exacerbation, and 3+ comorbidities: spine surgery, CABG, diabetes and Htn are also affecting patient's functional outcome.   REHAB POTENTIAL: Good  CLINICAL DECISION MAKING: Unstable/unpredictable  EVALUATION COMPLEXITY: High   GOALS: Goals reviewed with patient? Yes  SHORT TERM GOALS: Target date: 10/26/2023  Pain report to be no greater than 4/10  Baseline: Goal status: Met on 11/01/23  2.  Patient will be independent with initial HEP  Baseline:  Goal status: Met on 10/18/23  3.  Patient to be able to ambulate 50 feet without a.d Baseline:  Goal status: Met on 11/01/2023  4.  Patient to be able to do 10 sit to stand without UE support  Baseline:  Goal status: Ongoing    LONG TERM GOALS: Target date: 11/23/2023  Patient to report pain no greater than 2/10  Baseline:  Goal status: Progressing   2.  Patient to be independent with advanced HEP  Baseline:  Goal status: ongoing   3.  Patient to be able to stand or walk for at least 15 min without pain or need to rest Baseline:  Goal status: INITIAL  4.  Patient to be able to ascend and descend 12 steps safely Baseline:  Goal status: Ongoing   5.  Patient to report 85% improvement in overall symptoms and function Baseline:  Goal status: Ongoing  6.  Patient to be able to walk 500 feet without a.d. safely without rest breaks Baseline:  Goal status: Ongoing    PLAN:  PT FREQUENCY: 3x/week  PT DURATION: 8 weeks  PLANNED INTERVENTIONS: 97110-Therapeutic exercises, 97530- Therapeutic activity, [id number]- Neuromuscular re-education, 97535- Self Care,  02859- Manual therapy, [id number]- Gait training, 02886- Aquatic Therapy, [phone]- Electrical stimulation (unattended), [phone]- Electrical stimulation (manual), [id number]-  Vasopneumatic device, [id number]- Ultrasound, [id number]- Ionotophoresis 4mg /ml Dexamethasone , Patient/Family education, Balance training, Stair training, Taping, Joint mobilization, Spinal mobilization, Cryotherapy, and Moist heat  PLAN FOR NEXT SESSION: Nustep, continue functional strength training: he needs higher level functional training; update HEP, progress balance exercises   Jarrell Laming, PT, DPT 11/01/23, 1:23 PM   [facility] [address] [place], [place] [address] Phone # [phone] Fax [phone]

## 2023-11-03 ENCOUNTER — Ambulatory Visit
Attending: Pharmacist Clinician (PhC)/ Clinical Pharmacy Specialist | Admitting: Pharmacist Clinician (PhC)/ Clinical Pharmacy Specialist

## 2023-11-03 ENCOUNTER — Ambulatory Visit (HOSPITAL_BASED_OUTPATIENT_CLINIC_OR_DEPARTMENT_OTHER): Attending: Student | Admitting: Physical Therapy

## 2023-11-03 ENCOUNTER — Encounter (HOSPITAL_BASED_OUTPATIENT_CLINIC_OR_DEPARTMENT_OTHER): Payer: Self-pay | Admitting: Physical Therapy

## 2023-11-03 DIAGNOSIS — M25552 Pain in left hip: Secondary | ICD-10-CM | POA: Insufficient documentation

## 2023-11-03 DIAGNOSIS — M25652 Stiffness of left hip, not elsewhere classified: Secondary | ICD-10-CM | POA: Insufficient documentation

## 2023-11-03 DIAGNOSIS — R252 Cramp and spasm: Secondary | ICD-10-CM | POA: Insufficient documentation

## 2023-11-03 DIAGNOSIS — M6281 Muscle weakness (generalized): Secondary | ICD-10-CM | POA: Insufficient documentation

## 2023-11-03 NOTE — Therapy (Signed)
 [facility] at [facility] [address] [place], [place], [address] Phone: [phone]   Fax:  [phone]  Patient Details  Name: David [name] MRN: [id number] Date of Birth: 1960/05/03 Referring Provider:  Kendal Franky SQUIBB, MD  Encounter Date: 11/03/2023   Chart reviewed. Patient has had recent debridement of area on Rt foot.  Per chart, patient has open wound.   Called and left message regarding cancellation of appointment due to contraindication of aquatic therapy with open wound.     Patient arrived for appointment but was not treated.  Patient stated that he did not receive message.  Today's appointment and next Friday's aquatic appointment cancelled while wound heals.   Patient verbalized understanding.    Delon Aquas, PTA 11/03/23 2:09 PM [facility] [address] [place], [place], [address] Phone: [phone]   Fax:  [phone]

## 2023-11-03 NOTE — Progress Notes (Deleted)
 Office Visit    Patient Name: David [name] Date of Encounter: 11/03/2023  Primary Care Provider:  Delbert Clam, MD Primary Cardiologist:  Maude Emmer, MD  Chief Complaint    Hyperlipidemia   Significant Past Medical History   HFimEF   DM2 1/25 A1c 7.4  HTN   AF Persistent, CHADS2-VASc = 4, on Eliquis ; LAA clip  CAD 1/25 NSTEMI w/CABG x 4   CKD Stage 3b     No Known Allergies  History of Present Illness    David [name] is a 63 y.o. male patient of Dr Emmer, in the office today to discuss options for cholesterol management.  He was seen in the past by Robbi Blanch PharmD  Insurance Carrier:   Pharmacy:     [facility]:      LDL Cholesterol goal:    Current Medications:     Previously tried:    Family Hx:     Social Hx: Tobacco: Alcohol:      Diet:      Exercise:   Adherence Assessment  Do you ever forget to take your medication? [] Yes [] No  Do you ever skip doses due to side effects? [] Yes [] No  Do you have trouble affording your medicines? [] Yes [] No  Are you ever unable to pick up your medication due to transportation difficulties? [] Yes [] No  Do you ever stop taking your medications because you don't believe they are helping? [] Yes [] No  Do you check your weight daily? [] Yes [] No   Adherence strategy: ***  Barriers to obtaining medications: ***     Accessory Clinical Findings   Lab Results  Component Value Date   CHOL 199 01/09/2023   HDL 36 (L) 01/09/2023   LDLCALC 121 (H) 01/09/2023   TRIG 235 (H) 01/09/2023   CHOLHDL 5.9 (H) 12/14/2021    Lipoprotein (a)  Date/Time Value Ref Range Status  09/28/2023 09:35 AM 9.6 <75.0 nmol/L Final    Comment:    Note:  Values greater than or equal to 75.0 nmol/L may        indicate an independent risk factor for CHD,        but must be evaluated with caution when applied        to non-Caucasian populations due to the        influence of genetic factors on Lp(a) across         ethnicities.     Lab Results  Component Value Date   ALT 16 07/13/2023   AST 20 07/13/2023   ALKPHOS 70 07/13/2023   BILITOT 0.7 07/13/2023   Lab Results  Component Value Date   CREATININE 1.83 (H) 10/26/2023   BUN 37 (H) 10/26/2023   NA 136 10/26/2023   K 4.8 10/26/2023   CL 99 10/26/2023   CO2 26 10/26/2023   Lab Results  Component Value Date   HGBA1C 7.9 (A) 10/24/2023    Home Medications    Current Outpatient Medications  Medication Sig Dispense Refill   Accu-Chek Softclix Lancets lancets Use to check blood sugar 3 times daily. 100 each 6   ALPRAZolam  (XANAX ) 0.5 MG tablet Take 1 tablet (0.5 mg total) by mouth at bedtime as needed for anxiety or sleep. (Patient not taking: Reported on 10/31/2023) 10 tablet 0   amiodarone  (PACERONE ) 200 MG tablet Take 1 tablet (200 mg total) by mouth daily for 10 days.     apixaban  (ELIQUIS ) 5 MG TABS tablet Take 1 tablet (5 mg total) by  mouth 2 (two) times daily. 180 tablet 1   carvedilol  (COREG ) 6.25 MG tablet Take 1 tablet (6.25 mg total) by mouth 2 (two) times daily. 180 tablet 3   cephALEXin  (KEFLEX ) 500 MG capsule Take 1 capsule (500 mg total) by mouth 4 (four) times daily. 40 capsule 0   Continuous Glucose Sensor (DEXCOM G7 SENSOR) MISC Use as directed & change every 10 days. 3 each 12   dapagliflozin  propanediol (FARXIGA ) 10 MG TABS tablet Take 1 tablet (10 mg total) by mouth daily before breakfast. 90 tablet 1   docusate sodium  (COLACE) 100 MG capsule Take 2 capsules (200 mg total) by mouth 2 (two) times daily. (Patient not taking: Reported on 10/31/2023)     doxycycline  (VIBRA -TABS) 100 MG tablet Take 1 tablet (100 mg total) by mouth every 12 (twelve) hours. 180 tablet 3   Evolocumab  (REPATHA  SURECLICK) 140 MG/ML SOAJ Inject 140 mg into the skin every 14 (fourteen) days. 6 mL 3   famotidine  (PEPCID ) 20 MG tablet Take 1 tablet (20 mg total) by mouth 2 times daily. 60 tablet 1   furosemide  (LASIX ) 40 MG tablet Take 1 tablet (40 mg  total) by mouth 2 (two) times daily. 180 tablet 1   glucose blood test strip Use to check blood sugar 3 times daily. 100 each 6   HYDROcodone -acetaminophen  (NORCO) 10-325 MG tablet Take 1 tablet by mouth every 4 (four) hours as needed for moderate pain (pain score 4-6). 30 tablet 0   icosapent  Ethyl (VASCEPA ) 1 g capsule Take 2 capsules (2 g total) by mouth 2 (two) times daily. 120 capsule 11   insulin  aspart (NOVOLOG ) 100 UNIT/ML injection Inject 0-15 Units into the skin 3 (three) times daily with meals. insulin  aspart (novoLOG ) injection 0-15 Units  0-15 Units, Subcutaneous, 3 times daily with meals, First dose on Tue 07/18/23 at 1200 Correction coverage: Moderate (average weight, post-op) CBG < 70: Implement Hypoglycemia Standing Orders and refer to Hypoglycemia Standing Orders sidebar report CBG 70 - 120: 0 units CBG 121 - 150: 2 units CBG 151 - 200: 3 units CBG 201 - 250: 5 units CBG 251 - 300: 8 units CBG 301 - 350: 11 units CBG 351 - 400: 15 units     insulin  glargine (LANTUS  SOLOSTAR) 100 UNIT/ML Solostar Pen Inject 27 Units into the skin daily. 30 mL 6   Insulin  Pen Needle (PEN NEEDLES) 32G X 4 MM MISC Use to inject insulin  once daily. 100 each 6   methocarbamol  (ROBAXIN ) 750 MG tablet Take 1 tablet (750 mg total) by mouth every 8 (eight) hours as needed for muscle spasms. (Patient not taking: Reported on 10/31/2023) 270 tablet 1   naloxone  (NARCAN ) nasal spray 4 mg/0.1 mL Opiates overdose (Patient taking differently: Place 1 spray into the nose once. Opiates overdose) 2 each 0   OXYCONTIN  10 MG 12 hr tablet Take 1 tablet (10 mg total) by mouth every 12 (twelve) hours. 14 tablet 0   polyethylene glycol (MIRALAX  / GLYCOLAX ) 17 g packet Mix 1 packet (17 g) in beverage and take by mouth 2 (two) times daily. (Patient taking differently: Take 17 g by mouth every other day.) 100 each 3   Potassium Chloride  ER 20 MEQ TBCR Take 1 tablet (20 mEq total) by mouth daily. 60 tablet 1   pregabalin  (LYRICA ) 75  MG capsule Take 1 capsule (75 mg total) by mouth 2 (two) times daily. 60 capsule 2   rosuvastatin  (CRESTOR ) 20 MG tablet Take 1 tablet (20  mg total) by mouth daily. 90 tablet 3   tamsulosin  (FLOMAX ) 0.4 MG CAPS capsule Take 1 capsule (0.4 mg total) by mouth daily after supper. 90 capsule 1   terbinafine  (LAMISIL ) 250 MG tablet Take 1 tablet (250 mg total) by mouth daily. 30 tablet 2   Vitamin D , Ergocalciferol , (DRISDOL ) 1.25 MG (50000 UNIT) CAPS capsule Take 1 capsule (50,000 Units total) by mouth every 7 (seven) days. (Patient not taking: Reported on 10/31/2023) 8 capsule 0   No current facility-administered medications for this visit.     Assessment & Plan    No problem-specific Assessment & Plan notes found for this encounter.   Diamonique Ruedas, PharmD CPP [facility] [address]   [place], [place] [address] [phone]  11/03/2023, 8:01 AM

## 2023-11-06 ENCOUNTER — Ambulatory Visit: Attending: Student

## 2023-11-06 DIAGNOSIS — R293 Abnormal posture: Secondary | ICD-10-CM | POA: Insufficient documentation

## 2023-11-06 DIAGNOSIS — R2689 Other abnormalities of gait and mobility: Secondary | ICD-10-CM | POA: Insufficient documentation

## 2023-11-06 DIAGNOSIS — R2681 Unsteadiness on feet: Secondary | ICD-10-CM | POA: Diagnosis present

## 2023-11-06 DIAGNOSIS — R262 Difficulty in walking, not elsewhere classified: Secondary | ICD-10-CM | POA: Insufficient documentation

## 2023-11-06 DIAGNOSIS — M25652 Stiffness of left hip, not elsewhere classified: Secondary | ICD-10-CM | POA: Insufficient documentation

## 2023-11-06 DIAGNOSIS — R252 Cramp and spasm: Secondary | ICD-10-CM | POA: Insufficient documentation

## 2023-11-06 DIAGNOSIS — M6281 Muscle weakness (generalized): Secondary | ICD-10-CM | POA: Diagnosis present

## 2023-11-06 DIAGNOSIS — M25552 Pain in left hip: Secondary | ICD-10-CM | POA: Insufficient documentation

## 2023-11-06 NOTE — Therapy (Signed)
 OUTPATIENT PHYSICAL THERAPY TREATMENT NOTE   Patient Name: David [name] MRN: [id number] DOB:14-May-1960, 63 y.o., male Today's Date: 11/06/2023  END OF SESSION:  PT End of Session - 11/06/23 1230     Visit Number 10    Date for PT Re-Evaluation 11/23/23    Authorization Type Medicaid no auth 27 visits    PT Start Time 1230    PT Stop Time 1310    PT Time Calculation (min) 40 min    Activity Tolerance Patient tolerated treatment well    Behavior During Therapy WFL for tasks assessed/performed                 Past Medical History:  Diagnosis Date   Cardiomyopathy (HCC)    a. EF 45% in 2019.   CHF (congestive heart failure) (HCC)    Chronic anticoagulation 09/30/2017   Diabetes mellitus type 2 in nonobese [facility])    Discitis of thoracic region 09/25/2022   Does not have health insurance    Essential hypertension 09/26/2017   Financial difficulties    H/O noncompliance with medical treatment, presenting hazards to health    Hardware complicating wound infection (HCC) 09/25/2022   Non-insulin  treated type 2 diabetes mellitus (HCC) 09/26/2017   Persistent atrial fibrillation (HCC)    Sleep apnea suspected 09/30/2017   years ago- not anymore per patient   Past Surgical History:  Procedure Laterality Date   ABDOMINAL AORTOGRAM W/LOWER EXTREMITY N/A 09/02/2021   Procedure: ABDOMINAL AORTOGRAM W/LOWER EXTREMITY;  Surgeon: Serene Gaile ORN, MD;  [facility]: [facility];  Service: Cardiovascular;  Laterality: N/A;   ABDOMINAL AORTOGRAM W/LOWER EXTREMITY N/A 12/28/2021   Procedure: ABDOMINAL AORTOGRAM W/LOWER EXTREMITY;  Surgeon: Serene Gaile ORN, MD;  Location: [facility];  Service: Cardiovascular;  Laterality: N/A;   ABDOMINAL AORTOGRAM W/LOWER EXTREMITY N/A 07/28/2022   Procedure: ABDOMINAL AORTOGRAM W/LOWER EXTREMITY;  Surgeon: Gretta Lonni PARAS, MD;  [facility]: [facility];  Service: Cardiovascular;  Laterality: N/A;   AMPUTATION Right 09/04/2021    Procedure: 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;  Surgeon: Harden Jerona GAILS, MD;  Location: [facility] OR;  Service: Orthopedics;  Laterality: Right;   AMPUTATION Right 08/02/2022   Procedure: AMPUTATION RAY 5TH METATARSAL OF RIGHT FOOT;  Surgeon: Barton Drape, MD;  Location: [facility] OR;  Service: Orthopedics;  Laterality: Right;   APPENDECTOMY  1971   BONE BIOPSY Right 08/02/2022   Procedure: BONE BIOPSY OF 5TH METATARSAL RIGHT FOOT;  Surgeon: Barton Drape, MD;  Location: [facility];  Service: Orthopedics;  Laterality: Right;   CARDIOVERSION N/A 09/28/2017   Procedure: CARDIOVERSION;  Surgeon: Francyne Headland, MD;  [facility]: [facility];  Service: Cardiovascular;  Laterality: N/A;   CLIPPING OF ATRIAL APPENDAGE N/A 04/18/2023   Procedure: CLIPPING OF ATRIAL APPENDAGE USING ATRICURE ATRICLIP [id number] with pulmonary vein isolation ablation;  Surgeon: Lucas Dorise POUR, MD;  Location: [facility] OR;  Service: Open Heart Surgery;  Laterality: N/A;   CORONARY ARTERY BYPASS GRAFT N/A 04/18/2023   Procedure: CORONARY ARTERY BYPASS GRAFTING (CABG) TIMES FOUR USING LEFT INTERNAL MAMMARY ARTERY AND ENDOSCOPICALLY HARVESTED LEFT GREATER SAPHENOUS VEIN;  Surgeon: Lucas Dorise POUR, MD;  Location: [facility];  Service: Open Heart Surgery;  Laterality: N/A;   IRRIGATION AND DEBRIDEMENT FOOT  08/02/2022   Procedure: IRRIGATION AND DEBRIDEMENT RIGHT 5TH METATARSAL FOOT;  Surgeon: Barton Drape, MD;  [facility];  Service: Orthopedics;;   LAMINECTOMY WITH POSTERIOR LATERAL ARTHRODESIS LEVEL 4 N/A 08/30/2022   Procedure: THORACIC FIVE-THORACIC ELEVEN FUSION, THORACIC  EIGHT TRANSPEDICULAR DECOMPRESSION AND PARTIAL CORPECTOMY with O-Arm;  Surgeon: Debby Dorn MATSU, MD;  Location: [facility] OR;  Service: Neurosurgery;  Laterality: N/A;   LEFT HEART CATH AND CORONARY ANGIOGRAPHY N/A 04/12/2023   Procedure: LEFT HEART CATH AND CORONARY ANGIOGRAPHY;  Surgeon: Verlin Lonni BIRCH, MD;  [facility];  Service: Cardiovascular;  Laterality:  N/A;   NASAL ENDOSCOPY Right 06/30/2023   Procedure: ENDOSCOPY, NOSE;  Surgeon: Jesus Oliphant, MD;  Location: [facility] OR;  Service: ENT;  Laterality: Right;   PERIPHERAL VASCULAR BALLOON ANGIOPLASTY  09/02/2021   Procedure: PERIPHERAL VASCULAR BALLOON ANGIOPLASTY;  Surgeon: Serene Gaile ORN, MD;  [facility];  Service: Cardiovascular;;   PERIPHERAL VASCULAR BALLOON ANGIOPLASTY  12/28/2021   Procedure: PERIPHERAL VASCULAR BALLOON ANGIOPLASTY;  Surgeon: Serene Gaile ORN, MD;  [facility];  Service: Cardiovascular;;  Posterior Tibial PTA only   POLYPECTOMY Right 06/30/2023   Procedure: POLYPECTOMY, NASAL CAVITY;  Surgeon: Jesus Oliphant, MD;  Location: [facility] OR;  Service: ENT;  Laterality: Right;   TEE WITHOUT CARDIOVERSION N/A 09/28/2017   Procedure: TRANSESOPHAGEAL ECHOCARDIOGRAM (TEE);  Surgeon: Francyne Headland, MD;  Location: [facility] ENDOSCOPY;  Service: Cardiovascular;  Laterality: N/A;   TEE WITHOUT CARDIOVERSION N/A 09/03/2021   Procedure: TRANSESOPHAGEAL ECHOCARDIOGRAM (TEE);  Surgeon: Raford Riggs, MD;  Location: [facility] ENDOSCOPY;  Service: Cardiovascular;  Laterality: N/A;   TEE WITHOUT CARDIOVERSION N/A 07/15/2022   Procedure: TRANSESOPHAGEAL ECHOCARDIOGRAM;  Surgeon: Francyne Headland, MD;  [facility];  Service: Cardiovascular;  Laterality: N/A;   TEE WITHOUT CARDIOVERSION N/A 04/18/2023   Procedure: TRANSESOPHAGEAL ECHOCARDIOGRAM (TEE);  Surgeon: Lucas Dorise POUR, MD;  Location: [facility] OR;  Service: Open Heart Surgery;  Laterality: N/A;   TOTAL HIP ARTHROPLASTY Left 07/14/2023   Procedure: ARTHROPLASTY, HIP, TOTAL, ANTERIOR APPROACH;  Surgeon: Kendal Franky SQUIBB, MD;  Location: [facility];  Service: Orthopedics;  Laterality: Left;   Patient Active Problem List   Diagnosis Date Noted   Left displaced femoral neck fracture (HCC) 07/13/2023   Closed left femoral fracture (HCC) 07/12/2023   History of CHF (congestive heart failure) 07/12/2023   Chronic kidney disease  (CKD), stage III (moderate) (HCC) 07/12/2023   Hyperlipidemia 07/12/2023   BPH (benign prostatic hyperplasia) 07/12/2023   Peripheral neuropathy 07/12/2023   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   Bradycardia 04/14/2023   NSTEMI (non-ST elevated myocardial infarction) (HCC) 04/13/2023   Chronic bilateral low back pain 04/13/2023   History of CABG in  04/2023 04/12/2023   Acute decompensated heart failure (HCC) 04/10/2023   Chronic pain syndrome 04/09/2023   MRSA infection greater than 3 months ago 04/09/2023   Hypoglycemia 10/06/2022   Pleural effusion 09/29/2022   Chronic systolic CHF (congestive heart failure) (HCC) 09/29/2022   Acute urinary retention 09/29/2022   Anxiety 09/29/2022   Hardware complicating wound infection (HCC) 09/25/2022   Discitis of thoracic region 09/25/2022   Hypokalemia 09/07/2022   Acute toxic encephalopathy 09/07/2022   Acute postoperative anemia due to expected blood loss 09/07/2022   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   Adjustment disorder with mixed anxiety and depressed mood 08/13/2022   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 08/08/2022   MRSA bacteremia 08/08/2022   AKI (acute kidney injury), ruled out 07/27/2022   Discitis thoracic region 07/27/2022   Bacteremia due to methicillin resistant Staphylococcus aureus 07/27/2022   Severe protein-calorie malnutrition (HCC) 07/26/2022   PVD (peripheral vascular disease) (HCC) 07/26/2022   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   Bacteremia 07/15/2022   Atelectasis 07/12/2022  Sepsis due to pneumonia (HCC) 07/12/2022   Acute on chronic diastolic (congestive) heart failure (HCC) 07/12/2022   Gangrene of toe of right foot (HCC)    Sepsis (HCC) 08/29/2021   SIRS (systemic inflammatory response syndrome) (HCC) 08/29/2021   Hyponatremia 08/29/2021   Hypomagnesemia 08/29/2021   Pure hypercholesterolemia 06/17/2021   Pain of left hand 09/06/2019   Cellulitis 08/26/2019   Persistent atrial fibrillation (HCC) 08/26/2019    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) 10/18/2018   Transaminitis 10/17/2018   Hyperbilirubinemia 10/17/2018   Leukocytosis 10/17/2018   Sinusitis 10/17/2018   Chronic anticoagulation 09/30/2017   Smoker 09/30/2017   Cardiomyopathy (HCC) 09/30/2017   Sleep apnea suspected 09/30/2017   Essential hypertension 09/26/2017   Insulin  dependent type 2 diabetes mellitus (HCC) 09/26/2017   Permanent atrial fibrillation (HCC) 09/26/2017    PCP: Delbert Clam, MD  REFERRING PROVIDER: Kendal Franky SQUIBB, MD  REFERRING DIAG: [phone] (ICD-10-CM) - Status post total hip replacement, left  THERAPY DIAG:  Stiffness of left hip, not elsewhere classified  Pain in left hip  Cramp and spasm  Muscle weakness (generalized)  Difficulty in walking, not elsewhere classified  Unsteadiness on feet  Rationale for Evaluation and Treatment: Rehabilitation  ONSET DATE: 09/26/2023  SUBJECTIVE:   SUBJECTIVE STATEMENT:  Pt states that he continues to feel stronger. He is having more pain today due to having run out of pain medication from missing his pain management appointment last week.   EVAL: Patient had series of orthopedic issues. He was in PT for back pain when he was found to have MRSA infection in spine May 2024, surgery followed for fusion/reconstruction, January had CABG, was still on cane once he got home and fell in bathroom and suffered hip fracture on left with subsequent THA. His THA was on 07/14/23. He was in rehab for several weeks and is now home. He felt he did not get aggressive enough PT in rehab. He then had home health and only did this 1 time per week. He did his exercises and is very motivated to get his function back. He is unsure if he will return to work. He is concerned about the physicality of the job with all that he has endured. He states this all depends on how much function he gets back. His goal is to be able to walk without the cane and build some  upper body strength to be able to function at the level he did before and do his job.   PERTINENT HISTORY: Left LE neural impairment since back surgery; right toe amputations See above for extensive medical history  PAIN: 11/06/23 Are you having pain? Yes: NPRS scale: 4/10 Pain location: left hip Pain description: aching / stiff Aggravating factors: sitting Relieving factors: moving  PRECAUTIONS: Fall  RED FLAGS: None   WEIGHT BEARING RESTRICTIONS: No  FALLS:  Has patient fallen in last 6 months? Yes. Number of falls 1  LIVING ENVIRONMENT: Lives with: lives with their spouse Lives in: House/apartment Stairs: No Has following equipment at home: Single point cane  OCCUPATION: chef  PLOF: Independent, Independent with basic ADLs, Independent with household mobility without device, Independent with community mobility without device, Independent with homemaking with ambulation, Independent with gait, and Independent with transfers  PATIENT GOALS: To be able to walk without the cane and build some upper body strength to be able to function at the level he did before and do his job.    NEXT MD VISIT: 2 months  OBJECTIVE:  Note: Objective measures were completed at Evaluation unless otherwise noted.  DIAGNOSTIC FINDINGS: na  PATIENT SURVEYS:  Eval:  LEFS : 30/80= 50%  COGNITION: Overall cognitive status: Within functional limits for tasks assessed     SENSATION: WFL   MUSCLE LENGTH: Hips tight bilaterally in hip flexors, quads and rotation both IR and ER   Left > Right  POSTURE: rounded shoulders, forward head, decreased lumbar lordosis, posterior pelvic tilt, and flexed trunk    LOWER EXTREMITY ROM:  WFL  LOWER EXTREMITY MMT:  MMT Right eval Left eval  Hip flexion  4  Hip extension  4-  Hip abduction  4  Hip adduction  4  Hip internal rotation  4  Hip external rotation  4  Knee flexion  4  Knee extension  4+  Ankle dorsiflexion    Ankle  plantarflexion    Ankle inversion    Ankle eversion     (Blank rows = not tested)   FUNCTIONAL TESTS:  Eval: 5 times sit to stand: 15.14 sec (no UE use) Timed up and go (TUG): 14.00 sec  GAIT: Distance walked: 50 feet Assistive device utilized: Single point cane Level of assistance: SBA Comments: antalgic/guarded/ slow                                                                                                                                TREATMENT DATE:   11/06/23 Nustep level 6 x 6.5 min with PT present to discuss status Standing hamstring stretch on 8 inch step 2x20 sec bilat FWD step ups/over 8 inch step x10 bilat Standing calf stretch on slanted rocker board 2x20 sec FWD step over 3 hurdles by barre down and back x4 laps Side step over 3 hurdles by barre down and back x4 laps Seated hamstring curl with blue theraband 2x10 bilat Standing rows with blue theraband with marching 2x5 bilat Standing alt toe taps to 2nd step 2x10   11/01/2023: Nustep level 5 x6 min with PT present to discuss status Standing hamstring stretch at stairs 2x20 sec bilat FWD step ups/through to next step x10 bilat Standing calf stretch on slanted rocker board 2x20 sec FWD step over 3 hurdles by barre down and back x3 laps Side step over 3 hurdles by barre down and back x3 laps Seated hamstring curl with blue theraband 2x10 bilat Standing rows with blue theraband with marching 2x5 bilat Standing alt toe taps to 2nd step 2x10   10/30/23 Seated hamstring 2x20 sec each side NuStep went up to level 6 for 5 min- Stood by pt to discuss progress/ monitored Leg press seat 8 2x10 80 # 2x15 Standing cone taps (two cones) at barre 2x 5 each side for single limb balance challenge PPT 3x8 in supine to increase lumbopelvic stability Glute Bridges in supine 2x10 Quad set 1x15 both sides Education- self care with bilateral feet due to diabetic neuropathy; mental health awareness and discussed  medical  trauma pt is working through    [employer] impression: Pt having more pain today due to not having pain medication; however, he was still able to progress endurance on nu-step and laps with hurdle step overs forward and back. He continues to demonstrate bil knee hyperextension which gets worse when he does not have UE support. He requires cues to slow down and focus with step overs as he gets more tired. Patient continues to require skilled PT to progress towards goal related activities.     PATIENT EDUCATION:  Education details: Initiated HEP Person educated: Patient Education method: Programmer, multimedia, Facilities manager, Verbal cues, and Handouts Education comprehension: verbalized understanding and returned demonstration  HOME EXERCISE PROGRAM: Access Code: [email or web address] URL: [email or web address] Date: 10/04/2023 Prepared by: Glade Pesa  Exercises - Squat with Chair Touch  - 1 x daily - 7 x weekly - 4 sets - 5 reps - Side Stepping with Resistance at Ankles  - 1 x daily - 7 x weekly - 3 sets - 10 reps - Seated Hip Abduction with Resistance  - Khanh pushes himself a lot while exercising in the water, possibly pushes himself too hard. Increased workload today by increasing reps and difficulty of each exercise.. Qtip provided less support with standing activities. Sit to stand exercise extremely difficult.     OBJECTIVE IMPAIRMENTS: Abnormal gait, cardiopulmonary status limiting activity, decreased activity tolerance, decreased balance, decreased endurance, difficulty walking, decreased ROM, decreased strength, increased fascial restrictions, increased muscle spasms, impaired flexibility, impaired UE functional use, improper body mechanics, postural dysfunction, and pain.   ACTIVITY LIMITATIONS: carrying, lifting, bending, sitting, standing, squatting, sleeping, stairs, transfers, bed mobility, bathing, toileting, dressing, reach over head, hygiene/grooming, and caring for  others  PARTICIPATION LIMITATIONS: meal prep, cleaning, laundry, driving, shopping, community activity, occupation, yard work, and church  PERSONAL FACTORS: Fitness, Past/current experiences, Profession, Time since onset of injury/illness/exacerbation, and 3+ comorbidities: spine surgery, CABG, diabetes and Htn are also affecting patient's functional outcome.   REHAB POTENTIAL: Good  CLINICAL DECISION MAKING: Unstable/unpredictable  EVALUATION COMPLEXITY: High   GOALS: Goals reviewed with patient? Yes  SHORT TERM GOALS: Target date: 10/26/2023  Pain report to be no greater than 4/10  Baseline: Goal status: Met on 11/01/23  2.  Patient will be independent with initial HEP  Baseline:  Goal status: Met on 10/18/23  3.  Patient to be able to ambulate 50 feet without a.d Baseline:  Goal status: Met on 11/01/2023  4.  Patient to be able to do 10 sit to stand without UE support  Baseline:  Goal status: Ongoing    LONG TERM GOALS: Target date: 11/23/2023  Patient to report pain no greater than 2/10  Baseline:  Goal status: Progressing   2.  Patient to be independent with advanced HEP  Baseline:  Goal status: ongoing   3.  Patient to be able to stand or walk for at least 15 min without pain or need to rest Baseline:  Goal status: INITIAL  4.  Patient to be able to ascend and descend 12 steps safely Baseline:  Goal status: Ongoing   5.  Patient to report 85% improvement in overall symptoms and function Baseline:  Goal status: Ongoing  6.  Patient to be able to walk 500 feet without a.d. safely without rest breaks Baseline:  Goal status: Ongoing    PLAN:  PT FREQUENCY: 3x/week  PT DURATION: 8 weeks  PLANNED INTERVENTIONS: 97110-Therapeutic exercises, 97530- Therapeutic activity, 97112- Neuromuscular re-education, 97535- Self Care, 02859- Manual therapy,  02883- Gait training, 02886- Aquatic Therapy, [phone]- Electrical stimulation (unattended), [id number]- Electrical  stimulation (manual), [id number]- Vasopneumatic device, [id number]- Ultrasound, [id number]- Ionotophoresis 4mg /ml Dexamethasone , Patient/Family education, Balance training, Stair training, Taping, Joint mobilization, Spinal mobilization, Cryotherapy, and Moist heat  PLAN FOR NEXT SESSION: Nustep, continue functional strength training: he needs higher level functional training; update HEP, progress balance exercises   Josette Mares, PT, DPT08/07/2510:31 PM

## 2023-11-07 ENCOUNTER — Other Ambulatory Visit (HOSPITAL_COMMUNITY): Payer: Self-pay

## 2023-11-07 ENCOUNTER — Telehealth: Payer: Self-pay

## 2023-11-07 ENCOUNTER — Other Ambulatory Visit: Payer: Self-pay

## 2023-11-07 DIAGNOSIS — F419 Anxiety disorder, unspecified: Secondary | ICD-10-CM

## 2023-11-07 NOTE — Addendum Note (Signed)
 Addended by: Kennieth Plotts on: 11/07/2023 06:47 PM   Modules accepted: Orders

## 2023-11-07 NOTE — Telephone Encounter (Signed)
 Copied from CRM #[id number]. Topic: Referral - Status >> Nov 07, 2023  2:37 PM Fonda T wrote: Reason for CRM: Received call from patient, states he was recommended to see behavioral health per primary care provider. Patient called to schedule an appointment with [facility]. They advised patient will need a referral from PCP to [facility].  Patient requesting a follow up call once referral is placed, so that an appointment can be scheduled as previously recommended by provider.  Can be reached at [phone].  Thank you

## 2023-11-07 NOTE — Telephone Encounter (Signed)
 Routing to PCP for review.

## 2023-11-07 NOTE — Telephone Encounter (Signed)
 Referral has been placed.

## 2023-11-08 ENCOUNTER — Ambulatory Visit: Admitting: Rehabilitative and Restorative Service Providers"

## 2023-11-08 ENCOUNTER — Encounter: Payer: Self-pay | Admitting: Rehabilitative and Restorative Service Providers"

## 2023-11-08 DIAGNOSIS — M25652 Stiffness of left hip, not elsewhere classified: Secondary | ICD-10-CM | POA: Diagnosis not present

## 2023-11-08 DIAGNOSIS — R252 Cramp and spasm: Secondary | ICD-10-CM

## 2023-11-08 DIAGNOSIS — M25552 Pain in left hip: Secondary | ICD-10-CM

## 2023-11-08 NOTE — Telephone Encounter (Signed)
Patient has been informed of referral being placed

## 2023-11-08 NOTE — Therapy (Signed)
 OUTPATIENT PHYSICAL THERAPY TREATMENT NOTE   Patient Name: David [name] MRN: [id number] DOB:March 01, 1961, 63 y.o., male Today's Date: 11/08/2023  END OF SESSION:  PT End of Session - 11/08/23 1240     Visit Number 11    Date for PT Re-Evaluation 11/23/23    Authorization Type Medicaid no auth 27 visits    PT Start Time 1235    PT Stop Time 1315    PT Time Calculation (min) 40 min    Activity Tolerance Patient tolerated treatment well    Behavior During Therapy WFL for tasks assessed/performed                 Past Medical History:  Diagnosis Date   Cardiomyopathy (HCC)    a. EF 45% in 2019.   CHF (congestive heart failure) (HCC)    Chronic anticoagulation 09/30/2017   Diabetes mellitus type 2 in nonobese [facility])    Discitis of thoracic region 09/25/2022   Does not have health insurance    Essential hypertension 09/26/2017   Financial difficulties    H/O noncompliance with medical treatment, presenting hazards to health    Hardware complicating wound infection (HCC) 09/25/2022   Non-insulin  treated type 2 diabetes mellitus (HCC) 09/26/2017   Persistent atrial fibrillation (HCC)    Sleep apnea suspected 09/30/2017   years ago- not anymore per patient   Past Surgical History:  Procedure Laterality Date   ABDOMINAL AORTOGRAM W/LOWER EXTREMITY N/A 09/02/2021   Procedure: ABDOMINAL AORTOGRAM W/LOWER EXTREMITY;  Surgeon: Serene Gaile ORN, MD;  [facility];  Service: Cardiovascular;  Laterality: N/A;   ABDOMINAL AORTOGRAM W/LOWER EXTREMITY N/A 12/28/2021   Procedure: ABDOMINAL AORTOGRAM W/LOWER EXTREMITY;  Surgeon: Serene Gaile ORN, MD;  [facility]: [facility];  Service: Cardiovascular;  Laterality: N/A;   ABDOMINAL AORTOGRAM W/LOWER EXTREMITY N/A 07/28/2022   Procedure: ABDOMINAL AORTOGRAM W/LOWER EXTREMITY;  Surgeon: Gretta Lonni PARAS, MD;  [facility]: [facility];  Service: Cardiovascular;  Laterality: N/A;   AMPUTATION Right 09/04/2021    Procedure: 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;  Surgeon: Harden Jerona GAILS, MD;  Location: [facility] OR;  Service: Orthopedics;  Laterality: Right;   AMPUTATION Right 08/02/2022   Procedure: AMPUTATION RAY 5TH METATARSAL OF RIGHT FOOT;  Surgeon: Barton Drape, MD;  [facility]: [facility];  Service: Orthopedics;  Laterality: Right;   APPENDECTOMY  1971   BONE BIOPSY Right 08/02/2022   Procedure: BONE BIOPSY OF 5TH METATARSAL RIGHT FOOT;  Surgeon: Barton Drape, MD;  [facility];  Service: Orthopedics;  Laterality: Right;   CARDIOVERSION N/A 09/28/2017   Procedure: CARDIOVERSION;  Surgeon: Francyne Headland, MD;  [facility];  Service: Cardiovascular;  Laterality: N/A;   CLIPPING OF ATRIAL APPENDAGE N/A 04/18/2023   Procedure: CLIPPING OF ATRIAL APPENDAGE USING ATRICURE ATRICLIP [id number] with pulmonary vein isolation ablation;  Surgeon: Lucas Dorise POUR, MD;  Location: [facility] OR;  Service: Open Heart Surgery;  Laterality: N/A;   CORONARY ARTERY BYPASS GRAFT N/A 04/18/2023   Procedure: CORONARY ARTERY BYPASS GRAFTING (CABG) TIMES FOUR USING LEFT INTERNAL MAMMARY ARTERY AND ENDOSCOPICALLY HARVESTED LEFT GREATER SAPHENOUS VEIN;  Surgeon: Lucas Dorise POUR, MD;  [facility];  Service: Open Heart Surgery;  Laterality: N/A;   IRRIGATION AND DEBRIDEMENT FOOT  08/02/2022   Procedure: IRRIGATION AND DEBRIDEMENT RIGHT 5TH METATARSAL FOOT;  Surgeon: Barton Drape, MD;  [facility];  Service: Orthopedics;;   LAMINECTOMY WITH POSTERIOR LATERAL ARTHRODESIS LEVEL 4 N/A 08/30/2022   Procedure: THORACIC FIVE-THORACIC ELEVEN FUSION, THORACIC  EIGHT TRANSPEDICULAR DECOMPRESSION AND PARTIAL CORPECTOMY with O-Arm;  Surgeon: Debby Dorn MATSU, MD;  Location: [facility] OR;  Service: Neurosurgery;  Laterality: N/A;   LEFT HEART CATH AND CORONARY ANGIOGRAPHY N/A 04/12/2023   Procedure: LEFT HEART CATH AND CORONARY ANGIOGRAPHY;  Surgeon: Verlin Lonni BIRCH, MD;  [facility];  Service: Cardiovascular;  Laterality:  N/A;   NASAL ENDOSCOPY Right 06/30/2023   Procedure: ENDOSCOPY, NOSE;  Surgeon: Jesus Oliphant, MD;  Location: [facility] OR;  Service: ENT;  Laterality: Right;   PERIPHERAL VASCULAR BALLOON ANGIOPLASTY  09/02/2021   Procedure: PERIPHERAL VASCULAR BALLOON ANGIOPLASTY;  Surgeon: Serene Gaile ORN, MD;  [facility];  Service: Cardiovascular;;   PERIPHERAL VASCULAR BALLOON ANGIOPLASTY  12/28/2021   Procedure: PERIPHERAL VASCULAR BALLOON ANGIOPLASTY;  Surgeon: Serene Gaile ORN, MD;  [facility];  Service: Cardiovascular;;  Posterior Tibial PTA only   POLYPECTOMY Right 06/30/2023   Procedure: POLYPECTOMY, NASAL CAVITY;  Surgeon: Jesus Oliphant, MD;  Location: [facility] OR;  Service: ENT;  Laterality: Right;   TEE WITHOUT CARDIOVERSION N/A 09/28/2017   Procedure: TRANSESOPHAGEAL ECHOCARDIOGRAM (TEE);  Surgeon: Francyne Headland, MD;  Location: [facility] ENDOSCOPY;  Service: Cardiovascular;  Laterality: N/A;   TEE WITHOUT CARDIOVERSION N/A 09/03/2021   Procedure: TRANSESOPHAGEAL ECHOCARDIOGRAM (TEE);  Surgeon: Raford Riggs, MD;  Location: [facility] ENDOSCOPY;  Service: Cardiovascular;  Laterality: N/A;   TEE WITHOUT CARDIOVERSION N/A 07/15/2022   Procedure: TRANSESOPHAGEAL ECHOCARDIOGRAM;  Surgeon: Francyne Headland, MD;  [facility];  Service: Cardiovascular;  Laterality: N/A;   TEE WITHOUT CARDIOVERSION N/A 04/18/2023   Procedure: TRANSESOPHAGEAL ECHOCARDIOGRAM (TEE);  Surgeon: Lucas Dorise POUR, MD;  Location[facility] OR;  Service: Open Heart Surgery;  Laterality: N/A;   TOTAL HIP ARTHROPLASTY Left 07/14/2023   Procedure: ARTHROPLASTY, HIP, TOTAL, ANTERIOR APPROACH;  Surgeon: Kendal Franky SQUIBB, MD;  [facility];  Service: Orthopedics;  Laterality: Left;   Patient Active Problem List   Diagnosis Date Noted   Left displaced femoral neck fracture (HCC) 07/13/2023   Closed left femoral fracture (HCC) 07/12/2023   History of CHF (congestive heart failure) 07/12/2023   Chronic kidney disease  (CKD), stage III (moderate) (HCC) 07/12/2023   Hyperlipidemia 07/12/2023   BPH (benign prostatic hyperplasia) 07/12/2023   Peripheral neuropathy 07/12/2023   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   Bradycardia 04/14/2023   NSTEMI (non-ST elevated myocardial infarction) (HCC) 04/13/2023   Chronic bilateral low back pain 04/13/2023   History of CABG in  04/2023 04/12/2023   Acute decompensated heart failure (HCC) 04/10/2023   Chronic pain syndrome 04/09/2023   MRSA infection greater than 3 months ago 04/09/2023   Hypoglycemia 10/06/2022   Pleural effusion 09/29/2022   Chronic systolic CHF (congestive heart failure) (HCC) 09/29/2022   Acute urinary retention 09/29/2022   Anxiety 09/29/2022   Hardware complicating wound infection (HCC) 09/25/2022   Discitis of thoracic region 09/25/2022   Hypokalemia 09/07/2022   Acute toxic encephalopathy 09/07/2022   Acute postoperative anemia due to expected blood loss 09/07/2022   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   Adjustment disorder with mixed anxiety and depressed mood 08/13/2022   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 08/08/2022   MRSA bacteremia 08/08/2022   AKI (acute kidney injury), ruled out 07/27/2022   Discitis thoracic region 07/27/2022   Bacteremia due to methicillin resistant Staphylococcus aureus 07/27/2022   Severe protein-calorie malnutrition (HCC) 07/26/2022   PVD (peripheral vascular disease) (HCC) 07/26/2022   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   Bacteremia 07/15/2022   Atelectasis 07/12/2022  Sepsis due to pneumonia (HCC) 07/12/2022   Acute on chronic diastolic (congestive) heart failure (HCC) 07/12/2022   Gangrene of toe of right foot (HCC)    Sepsis (HCC) 08/29/2021   SIRS (systemic inflammatory response syndrome) (HCC) 08/29/2021   Hyponatremia 08/29/2021   Hypomagnesemia 08/29/2021   Pure hypercholesterolemia 06/17/2021   Pain of left hand 09/06/2019   Cellulitis 08/26/2019   Persistent atrial fibrillation (HCC) 08/26/2019    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) 10/18/2018   Transaminitis 10/17/2018   Hyperbilirubinemia 10/17/2018   Leukocytosis 10/17/2018   Sinusitis 10/17/2018   Chronic anticoagulation 09/30/2017   Smoker 09/30/2017   Cardiomyopathy (HCC) 09/30/2017   Sleep apnea suspected 09/30/2017   Essential hypertension 09/26/2017   Insulin  dependent type 2 diabetes mellitus (HCC) 09/26/2017   Permanent atrial fibrillation (HCC) 09/26/2017    PCP: Delbert Clam, MD  REFERRING PROVIDER: Kendal Franky SQUIBB, MD  REFERRING DIAG: [phone] (ICD-10-CM) - Status post total hip replacement, left  THERAPY DIAG:  Stiffness of left hip, not elsewhere classified  Pain in left hip  Cramp and spasm  Rationale for Evaluation and Treatment: Rehabilitation  ONSET DATE: 09/26/2023  SUBJECTIVE:   SUBJECTIVE STATEMENT:  Pt reports that he still has not been able to obtain his pain medication yet and he is taking Aleve.  States that the rainy weather is causing some pain, as well.  EVAL: Patient had series of orthopedic issues. He was in PT for back pain when he was found to have MRSA infection in spine May 2024, surgery followed for fusion/reconstruction, January had CABG, was still on cane once he got home and fell in bathroom and suffered hip fracture on left with subsequent THA. His THA was on 07/14/23. He was in rehab for several weeks and is now home. He felt he did not get aggressive enough PT in rehab. He then had home health and only did this 1 time per week. He did his exercises and is very motivated to get his function back. He is unsure if he will return to work. He is concerned about the physicality of the job with all that he has endured. He states this all depends on how much function he gets back. His goal is to be able to walk without the cane and build some upper body strength to be able to function at the level he did before and do his job.   PERTINENT HISTORY: Left LE  neural impairment since back surgery; right toe amputations See above for extensive medical history  PAIN: 11/08/23 Are you having pain? Yes: NPRS scale: 4-6/10 Pain location: left hip Pain description: aching / stiff Aggravating factors: sitting Relieving factors: moving  PRECAUTIONS: Fall  RED FLAGS: None   WEIGHT BEARING RESTRICTIONS: No  FALLS:  Has patient fallen in last 6 months? Yes. Number of falls 1  LIVING ENVIRONMENT: Lives with: lives with their spouse Lives in: House/apartment Stairs: No Has following equipment at home: Single point cane  OCCUPATION: chef  PLOF: Independent, Independent with basic ADLs, Independent with household mobility without device, Independent with community mobility without device, Independent with homemaking with ambulation, Independent with gait, and Independent with transfers  PATIENT GOALS: To be able to walk without the cane and build some upper body strength to be able to function at the level he did before and do his job.    NEXT MD VISIT: 2 months  OBJECTIVE:  Note: Objective measures were completed at Evaluation unless otherwise noted.  DIAGNOSTIC  FINDINGS: na  PATIENT SURVEYS:  Eval:  LEFS : 30/80= 50% 11/08/2023:  Lower Extremity Functional Score: 26 / 80 = 32.5 %  COGNITION: Overall cognitive status: Within functional limits for tasks assessed     SENSATION: WFL   MUSCLE LENGTH: Hips tight bilaterally in hip flexors, quads and rotation both IR and ER   Left > Right  POSTURE: rounded shoulders, forward head, decreased lumbar lordosis, posterior pelvic tilt, and flexed trunk    LOWER EXTREMITY ROM:  WFL  LOWER EXTREMITY MMT:  MMT Right eval Left eval  Hip flexion  4  Hip extension  4-  Hip abduction  4  Hip adduction  4  Hip internal rotation  4  Hip external rotation  4  Knee flexion  4  Knee extension  4+  Ankle dorsiflexion    Ankle plantarflexion    Ankle inversion    Ankle eversion     (Blank  rows = not tested)   FUNCTIONAL TESTS:  Eval: 5 times sit to stand: 15.14 sec (no UE use) Timed up and go (TUG): 14.00 sec  GAIT: Distance walked: 50 feet Assistive device utilized: Single point cane Level of assistance: SBA Comments: antalgic/guarded/ slow                                                                                                                                TREATMENT DATE:   11/08/2023: Seated hamstring stretch 2x20 sec bilat Observe right foot wound, wound is scabbed over and appears closed.  Pt states Podiatrist told him he could resume pool once scabbed over Sit to/from stand without UE support x10 Sit to/from stand holding 5# kettle bell x10 FWD step over 4 small hurdles in parallel bars down and back x3 laps (with UE support, as needed) Side steps over 4 small hurdles in parallel bars down and back x3 laps (with UE support, as needed) Side steps with red tband 3x 6 ft bilat (without UE support) Seated hamstring curl with blue tband 2x10 bilat Seated marching with blue tband around knees 2x8 Single leg standing on blue foam pod:  hip flexion, hip extension, hip abduction.  X10 each bilat Nustep level 5 x5 min with PT present to discuss status   11/06/23 Nustep level 6 x 6.5 min with PT present to discuss status Standing hamstring stretch on 8 inch step 2x20 sec bilat FWD step ups/over 8 inch step x10 bilat Standing calf stretch on slanted rocker board 2x20 sec FWD step over 3 hurdles by barre down and back x4 laps Side step over 3 hurdles by barre down and back x4 laps Seated hamstring curl with blue theraband 2x10 bilat Standing rows with blue theraband with marching 2x5 bilat Standing alt toe taps to 2nd step 2x10   11/01/2023: Nustep level 5 x6 min with PT present to discuss status Standing hamstring stretch at stairs 2x20 sec bilat FWD step ups/through to next step x10  bilat Standing calf stretch on slanted rocker board 2x20 sec FWD step  over 3 hurdles by barre down and back x3 laps Side step over 3 hurdles by barre down and back x3 laps Seated hamstring curl with blue theraband 2x10 bilat Standing rows with blue theraband with marching 2x5 bilat Standing alt toe taps to 2nd step 2x10    ASSESSMENT:  CLINICAL IMPRESSION: Mr Jacuinde presents to skilled PT reporting that he is still having some increased pain due to not having his normal extended release pain medication.  Patient continues to be able to progress with session with hip strengthening and balance.  Patient with worsening score noted on Lower Extremity Functional Scale today, but he admits that he was not fully honest when he completed the assessment at evaluation.  Pt was able to perform 10 sit to stands without UE support and has now met all short term goals.  Patient continues to progress with balance and dynamic balance with 3 way clock taps.  Patient continues to require skilled PT to progress towards goal related activities.    PATIENT EDUCATION:  Education details: Initiated HEP Person educated: Patient Education method: Programmer, multimedia, Facilities manager, Verbal cues, and Handouts Education comprehension: verbalized understanding and returned demonstration  HOME EXERCISE PROGRAM: Access Code: [email or web address] URL: [email or web address] Date: 11/08/2023 Prepared by: Jarrell Cloteal Isaacson  Exercises - Seated Hip Abduction with Resistance  - 1 x daily - 7 x weekly - 1 sets - 20 reps - Seated March with Resistance  - 1 x daily - 7 x weekly - 2 sets - 8 reps - Squat with Chair Touch  - 1 x daily - 7 x weekly - 4 sets - 5 reps - Side Stepping with Resistance at Ankles  - 1 x daily - 7 x weekly - 3 sets - 10 reps - Forward Step Touch  - 2-3 x daily - 7 x weekly - 1 sets - 5-8 reps - Standing 3-way Hip with Walker  - 2-3 x daily - 7 x weekly - 1 sets - 5-8 reps    OBJECTIVE IMPAIRMENTS: Abnormal gait, cardiopulmonary status limiting activity, decreased activity  tolerance, decreased balance, decreased endurance, difficulty walking, decreased ROM, decreased strength, increased fascial restrictions, increased muscle spasms, impaired flexibility, impaired UE functional use, improper body mechanics, postural dysfunction, and pain.   ACTIVITY LIMITATIONS: carrying, lifting, bending, sitting, standing, squatting, sleeping, stairs, transfers, bed mobility, bathing, toileting, dressing, reach over head, hygiene/grooming, and caring for others  PARTICIPATION LIMITATIONS: meal prep, cleaning, laundry, driving, shopping, community activity, occupation, yard work, and church  PERSONAL FACTORS: Fitness, Past/current experiences, Profession, Time since onset of injury/illness/exacerbation, and 3+ comorbidities: spine surgery, CABG, diabetes and Htn are also affecting patient's functional outcome.   REHAB POTENTIAL: Good  CLINICAL DECISION MAKING: Unstable/unpredictable  EVALUATION COMPLEXITY: High   GOALS: Goals reviewed with patient? Yes  SHORT TERM GOALS: Target date: 10/26/2023  Pain report to be no greater than 4/10  Baseline: Goal status: Met on 11/01/23  2.  Patient will be independent with initial HEP  Baseline:  Goal status: Met on 10/18/23  3.  Patient to be able to ambulate 50 feet without a.d Baseline:  Goal status: Met on 11/01/2023  4.  Patient to be able to do 10 sit to stand without UE support  Baseline:  Goal status: Met on 11/08/23    LONG TERM GOALS: Target date: 11/23/2023  Patient to report pain no greater than 2/10  Baseline:  Goal status: Progressing   2.  Patient to be independent with advanced HEP  Baseline:  Goal status: ongoing   3.  Patient to be able to stand or walk for at least 15 min without pain or need to rest Baseline:  Goal status: INITIAL  4.  Patient to be able to ascend and descend 12 steps safely Baseline:  Goal status: Ongoing   5.  Patient to report 85% improvement in overall symptoms and  function Baseline:  Goal status: Ongoing  6.  Patient to be able to walk 500 feet without a.d. safely without rest breaks Baseline:  Goal status: Ongoing    PLAN:  PT FREQUENCY: 3x/week  PT DURATION: 8 weeks  PLANNED INTERVENTIONS: 97110-Therapeutic exercises, 97530- Therapeutic activity, 97112- Neuromuscular re-education, [phone]- Self Care, 02859- Manual therapy, [phone]- Gait training, [phone]- Aquatic Therapy, [phone]- Electrical stimulation (unattended), [phone]- Electrical stimulation (manual), [id number]- Vasopneumatic device, 97035- Ultrasound, 02966- Ionotophoresis 4mg /ml Dexamethasone , Patient/Family education, Balance training, Stair training, Taping, Joint mobilization, Spinal mobilization, Cryotherapy, and Moist heat  PLAN FOR NEXT SESSION: Nustep, continue functional strength training: he needs higher level functional training; update HEP, progress balance exercises   Jarrell Laming, PT, DPT 11/08/23, 12:41 PM  [facility] [address] [place], [place] [address] Phone # [phone] Fax [phone]

## 2023-11-09 ENCOUNTER — Encounter (HOSPITAL_COMMUNITY): Payer: Self-pay

## 2023-11-09 ENCOUNTER — Other Ambulatory Visit: Payer: Self-pay

## 2023-11-09 ENCOUNTER — Emergency Department (HOSPITAL_COMMUNITY)
Admission: EM | Admit: 2023-11-09 | Discharge: 2023-11-09 | Disposition: A | Discharge status: Home / Self Care | Attending: Emergency Medicine | Admitting: Emergency Medicine

## 2023-11-09 ENCOUNTER — Emergency Department (HOSPITAL_COMMUNITY)

## 2023-11-09 DIAGNOSIS — Z794 Long term (current) use of insulin: Secondary | ICD-10-CM | POA: Diagnosis not present

## 2023-11-09 DIAGNOSIS — I110 Hypertensive heart disease with heart failure: Secondary | ICD-10-CM | POA: Diagnosis not present

## 2023-11-09 DIAGNOSIS — E119 Type 2 diabetes mellitus without complications: Secondary | ICD-10-CM | POA: Insufficient documentation

## 2023-11-09 DIAGNOSIS — Z951 Presence of aortocoronary bypass graft: Secondary | ICD-10-CM | POA: Insufficient documentation

## 2023-11-09 DIAGNOSIS — I509 Heart failure, unspecified: Secondary | ICD-10-CM | POA: Diagnosis not present

## 2023-11-09 DIAGNOSIS — R0781 Pleurodynia: Secondary | ICD-10-CM | POA: Insufficient documentation

## 2023-11-09 DIAGNOSIS — Z7901 Long term (current) use of anticoagulants: Secondary | ICD-10-CM | POA: Diagnosis not present

## 2023-11-09 MED ORDER — HYDROCODONE-ACETAMINOPHEN 5-325 MG PO TABS
1.0000 | ORAL_TABLET | Freq: Once | ORAL | Status: AC
  Administered 2023-11-09: 1 via ORAL
  Filled 2023-11-09: qty 1

## 2023-11-09 NOTE — Discharge Instructions (Addendum)
 Chest x-ray did not show obvious rib fracture although sometimes these can hide on chest x-rays and can still be present treat this with your home pain medication (hydrocodone ) and use your incentive spirometer several times a day at home to help encourage deep breaths and prevent pneumonia.  Follow-up closely with your primary care doctor if pain is not relieving if you develop worsening chest pain, difficulty breathing, fevers or chills return for reevaluation.

## 2023-11-09 NOTE — ED Triage Notes (Signed)
 Pt arriving by POV from home for 8/10 rib pain. Pt reports leaning over the arm rest of his chair and hearing something snap. Denies previous trauma to same area. Pt requesting x-ray.

## 2023-11-09 NOTE — ED Provider Notes (Signed)
 [place] EMERGENCY DEPARTMENT AT [facility] [place] Provider Note   CSN: [id number] Arrival date & time: 11/09/23  9198     Patient presents with: rib cage pain   David [name] is a 63 y.o. male.   David [name] is a 63 y.o. male with history of diabetes, hypertension, CHF, s/p CABG, who presents to the emergency department for evaluation of left-sided rib pain.  Patient reports he dropped something and went to quickly bend over the side of the car and chair and felt like something pop over his left ribs and has since then about something of soreness there that is fortunately the pain.  No pain elsewhere.  Did not fall to the ground.  He reports he just wanted to check to see if he possibly broke a rib.  He has not taken anything for pain prior to arrival.  No other aggravating relieving factors.  The history is provided by the patient.       Prior to Admission medications   Medication Sig Start Date End Date Taking? Authorizing Provider  Accu-Chek Softclix Lancets lancets Use to check blood sugar 3 times daily. 11/01/23   Newlin, Enobong, MD  ALPRAZolam  (XANAX ) 0.5 MG tablet Take 1 tablet (0.5 mg total) by mouth at bedtime as needed for anxiety or sleep. Patient not taking: Reported on 10/31/2023 07/21/23   Elgergawy, Brayton RAMAN, MD  amiodarone  (PACERONE ) 200 MG tablet Take 1 tablet (200 mg total) by mouth daily for 10 days. 07/21/23 09/25/23  Elgergawy, Brayton RAMAN, MD  apixaban  (ELIQUIS ) 5 MG TABS tablet Take 1 tablet (5 mg total) by mouth 2 (two) times daily. 05/15/23   Nahser, Aleene PARAS, MD  carvedilol  (COREG ) 6.25 MG tablet Take 1 tablet (6.25 mg total) by mouth 2 (two) times daily. 06/16/23   Lucien Orren SAILOR, PA-C  cephALEXin  (KEFLEX ) 500 MG capsule Take 1 capsule (500 mg total) by mouth 4 (four) times daily. 10/26/23   Prosperi, Christian H, PA-C  Continuous Glucose Sensor (DEXCOM G7 SENSOR) MISC Use as directed & change every 10 days. 03/10/23   Newlin, Enobong, MD  dapagliflozin   propanediol (FARXIGA ) 10 MG TABS tablet Take 1 tablet (10 mg total) by mouth daily before breakfast. 03/06/23   Newlin, Enobong, MD  docusate sodium  (COLACE) 100 MG capsule Take 2 capsules (200 mg total) by mouth 2 (two) times daily. Patient not taking: Reported on 10/31/2023 07/21/23   Elgergawy, Brayton RAMAN, MD  doxycycline  (VIBRA -TABS) 100 MG tablet Take 1 tablet (100 mg total) by mouth every 12 (twelve) hours. 03/20/23   Dennise Kingsley, MD  Evolocumab  (REPATHA  SURECLICK) 140 MG/ML SOAJ Inject 140 mg into the skin every 14 (fourteen) days. 07/14/23   Delford Maude BROCKS, MD  famotidine  (PEPCID ) 20 MG tablet Take 1 tablet (20 mg total) by mouth 2 times daily. 10/10/23   Newlin, Enobong, MD  furosemide  (LASIX ) 40 MG tablet Take 1 tablet (40 mg total) by mouth 2 (two) times daily. 10/24/23 02/05/24  Newlin, Enobong, MD  glucose blood test strip Use to check blood sugar 3 times daily. 11/01/23   Newlin, Enobong, MD  HYDROcodone -acetaminophen  (NORCO) 10-325 MG tablet Take 1 tablet by mouth every 4 (four) hours as needed for moderate pain (pain score 4-6). 07/21/23   Elgergawy, Brayton RAMAN, MD  icosapent  Ethyl (VASCEPA ) 1 g capsule Take 2 capsules (2 g total) by mouth 2 (two) times daily. 10/02/23   Nishan, Peter C, MD  insulin  aspart (NOVOLOG ) 100 UNIT/ML injection Inject 0-15  Units into the skin 3 (three) times daily with meals. insulin  aspart (novoLOG ) injection 0-15 Units  0-15 Units, Subcutaneous, 3 times daily with meals, First dose on Tue 07/18/23 at 1200 Correction coverage: Moderate (average weight, post-op) CBG < 70: Implement Hypoglycemia Standing Orders and refer to Hypoglycemia Standing Orders sidebar report CBG 70 - 120: 0 units CBG 121 - 150: 2 units CBG 151 - 200: 3 units CBG 201 - 250: 5 units CBG 251 - 300: 8 units CBG 301 - 350: 11 units CBG 351 - 400: 15 units 07/21/23   Elgergawy, Brayton RAMAN, MD  insulin  glargine (LANTUS  SOLOSTAR) 100 UNIT/ML Solostar Pen Inject 27 Units into the skin daily. 10/02/23   Newlin,  Enobong, MD  Insulin  Pen Needle (PEN NEEDLES) 32G X 4 MM MISC Use to inject insulin  once daily. 10/02/23   Newlin, Enobong, MD  methocarbamol  (ROBAXIN ) 750 MG tablet Take 1 tablet (750 mg total) by mouth every 8 (eight) hours as needed for muscle spasms. Patient not taking: Reported on 10/31/2023 10/24/23   Newlin, Enobong, MD  naloxone  (NARCAN ) nasal spray 4 mg/0.1 mL Opiates overdose Patient taking differently: Place 1 spray into the nose once. Opiates overdose 10/21/22   Akula, Vijaya, MD  OXYCONTIN  10 MG 12 hr tablet Take 1 tablet (10 mg total) by mouth every 12 (twelve) hours. 07/21/23   Elgergawy, Brayton RAMAN, MD  polyethylene glycol (MIRALAX  / GLYCOLAX ) 17 g packet Mix 1 packet (17 g) in beverage and take by mouth 2 (two) times daily. Patient taking differently: Take 17 g by mouth every other day. 06/22/23   Danton Jon HERO, PA-C  Potassium Chloride  ER 20 MEQ TBCR Take 1 tablet (20 mEq total) by mouth daily. 09/12/23   Newlin, Enobong, MD  pregabalin  (LYRICA ) 75 MG capsule Take 1 capsule (75 mg total) by mouth 2 (two) times daily. 09/12/23   Newlin, Enobong, MD  rosuvastatin  (CRESTOR ) 20 MG tablet Take 1 tablet (20 mg total) by mouth daily. 10/16/23 02/05/24  Delford Maude BROCKS, MD  tamsulosin  (FLOMAX ) 0.4 MG CAPS capsule Take 1 capsule (0.4 mg total) by mouth daily after supper. 10/24/23   Newlin, Enobong, MD  terbinafine  (LAMISIL ) 250 MG tablet Take 1 tablet (250 mg total) by mouth daily. 09/12/23   Newlin, Enobong, MD  Vitamin D , Ergocalciferol , (DRISDOL ) 1.25 MG (50000 UNIT) CAPS capsule Take 1 capsule (50,000 Units total) by mouth every 7 (seven) days. Patient not taking: Reported on 10/31/2023 07/19/23   Danton Lauraine LABOR, PA-C    Allergies: Patient has no known allergies.    Review of Systems  Constitutional:  Negative for chills and fever.  Respiratory:  Negative for cough and shortness of breath.   Cardiovascular:  Positive for chest pain (Left lateral chest wall).    Updated Vital Signs BP  (!) 205/114 (BP Location: Left Arm)   Pulse 98   Temp 98 F (36.7 C)   Resp 16   Ht 6' (1.829 m)   Wt 88.5 kg   SpO2 94%   BMI 26.45 kg/m   Physical Exam Vitals and nursing note reviewed.  Constitutional:      General: He is not in acute distress.    Appearance: Normal appearance. He is well-developed. He is not ill-appearing or diaphoretic.  HENT:     Head: Normocephalic and atraumatic.  Eyes:     General:        Right eye: No discharge.        Left eye: No discharge.  Cardiovascular:     Rate and Rhythm: Normal rate and regular rhythm.     Heart sounds: Normal heart sounds.  Pulmonary:     Effort: Pulmonary effort is normal. No respiratory distress.     Comments: Focal point tenderness over the left lower lateral ribs without palpable deformity or overlying since, breath sounds clear and equal bilaterally, satting well on room air with no increased work of breathing Chest:     Chest wall: Tenderness present.  Neurological:     Mental Status: He is alert and oriented to person, place, and time.     Coordination: Coordination normal.  Psychiatric:        Mood and Affect: Mood normal.        Behavior: Behavior normal.     (all labs ordered are listed, but only abnormal results are displayed) Labs Reviewed - No data to display  EKG: None  Radiology: DG Ribs Unilateral W/Chest Left Result Date: 11/09/2023 CLINICAL DATA:  rib pain EXAM: LEFT RIBS AND CHEST - 3+ VIEW COMPARISON:  July 16, 2023 FINDINGS: No focal airspace consolidation, pleural effusion, or pneumothorax. Mild cardiomegaly. Left atrial appendage clip. CABG markers. No acute, displaced rib fracture or destructive lesion. Sternotomy wires. Thoracic fusion hardware. IMPRESSION: No acute, displaced rib fracture. Otherwise, clear lungs. Electronically Signed   By: Rogelia Myers M.D.   On: 11/09/2023 09:39     Procedures   Medications Ordered in the ED  HYDROcodone -acetaminophen  (NORCO/VICODIN) 5-325 MG per  tablet 1 tablet (1 tablet Oral Given 11/09/23 9175)                                    Medical Decision Making Amount and/or Complexity of Data Reviewed Radiology: ordered.  Risk Prescription drug management.   Patient presents with left lateral chest wall pain after hitting it on an arm chair leaning over to pick up something.  No obvious ecchymosis or deformity, vitals are normal and patient is well-appearing.  X-rays of the chest and left ribs obtained viewed and interpreted independently without obvious acute rib fracture.  No evidence of pneumothorax.  Will treat supportively for possible occult rib fracture with pain control and incentive spirometry.  Patient still has his incentive spirometer at home from his prior CABG surgery and will use that regularly.  Discussed PCP follow-up and return precautions.  Discharged home in good condition.     Final diagnoses:  Rib pain on left side    ED Discharge Orders     None          Alva Larraine JULIANNA DEVONNA 11/15/23 1124    Tegeler, Lonni PARAS, MD 11/15/23 1236

## 2023-11-10 ENCOUNTER — Other Ambulatory Visit (HOSPITAL_COMMUNITY): Payer: Self-pay

## 2023-11-10 ENCOUNTER — Ambulatory Visit: Admitting: Physical Therapy

## 2023-11-10 NOTE — Therapy (Deleted)
 OUTPATIENT PHYSICAL THERAPY TREATMENT NOTE   Patient Name: David [name] MRN: [id number] DOB:[birth year], 63 y.o., male Today's Date: 11/10/2023  END OF SESSION:           Past Medical History:  Diagnosis Date   Cardiomyopathy (HCC)    a. EF 45% in 2019.   CHF (congestive heart failure) (HCC)    Chronic anticoagulation 09/30/2017   Diabetes mellitus type 2 in nonobese [facility])    Does not have health insurance    Essential hypertension 09/26/2017   Financial difficulties    H/O noncompliance with medical treatment, presenting hazards to health    Hardware complicating wound infection (HCC) 09/25/2022   Non-insulin  treated type 2 diabetes mellitus (HCC) 09/26/2017   Persistent atrial fibrillation (HCC)    Sleep apnea suspected 09/30/2017   years ago- not anymore per patient   Past Surgical History:  Procedure Laterality Date   ABDOMINAL AORTOGRAM W/LOWER EXTREMITY N/A 09/02/2021   Procedure: ABDOMINAL AORTOGRAM W/LOWER EXTREMITY;  Surgeon: Serene Gaile ORN, MD;  [facility]: [facility];  Service: Cardiovascular;  Laterality: N/A;   ABDOMINAL AORTOGRAM W/LOWER EXTREMITY N/A 12/28/2021   Procedure: ABDOMINAL AORTOGRAM W/LOWER EXTREMITY;  Surgeon: Serene Gaile ORN, MD;  [facility]: [facility];  Service: Cardiovascular;  Laterality: N/A;   ABDOMINAL AORTOGRAM W/LOWER EXTREMITY N/A 07/28/2022   Procedure: ABDOMINAL AORTOGRAM W/LOWER EXTREMITY;  Surgeon: Gretta Lonni PARAS, MD;  [facility]: [facility];  Service: Cardiovascular;  Laterality: N/A;   AMPUTATION Right 09/04/2021   Procedure: 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;  Surgeon: Harden Jerona GAILS, MD;  Location: [facility] OR;  Service: Orthopedics;  Laterality: Right;   AMPUTATION Right 08/02/2022   Procedure: AMPUTATION RAY 5TH METATARSAL OF RIGHT FOOT;  Surgeon: Barton Drape, MD;  Location: [facility] OR;  Service: Orthopedics;  Laterality: Right;   APPENDECTOMY  1971   BONE BIOPSY Right 08/02/2022   Procedure: BONE BIOPSY OF  5TH METATARSAL RIGHT FOOT;  Surgeon: Barton Drape, MD;  Location: [facility];  Service: Orthopedics;  Laterality: Right;   CARDIOVERSION N/A 09/28/2017   Procedure: CARDIOVERSION;  Surgeon: Francyne Headland, MD;  [facility]: [facility];  Service: Cardiovascular;  Laterality: N/A;   CLIPPING OF ATRIAL APPENDAGE N/A 04/18/2023   Procedure: CLIPPING OF ATRIAL APPENDAGE USING ATRICURE ATRICLIP [id number] with pulmonary vein isolation ablation;  Surgeon: Lucas Dorise POUR, MD;  Location: [facility];  Service: Open Heart Surgery;  Laterality: N/A;   CORONARY ARTERY BYPASS GRAFT N/A 04/18/2023   Procedure: CORONARY ARTERY BYPASS GRAFTING (CABG) TIMES FOUR USING LEFT INTERNAL MAMMARY ARTERY AND ENDOSCOPICALLY HARVESTED LEFT GREATER SAPHENOUS VEIN;  Surgeon: Lucas Dorise POUR, MD;  Location: [facility];  Service: Open Heart Surgery;  Laterality: N/A;   IRRIGATION AND DEBRIDEMENT FOOT  08/02/2022   Procedure: IRRIGATION AND DEBRIDEMENT RIGHT 5TH METATARSAL FOOT;  Surgeon: Barton Drape, MD;  [facility]: [facility];  Service: Orthopedics;;   LAMINECTOMY WITH POSTERIOR LATERAL ARTHRODESIS LEVEL 4 N/A 08/30/2022   Procedure: THORACIC FIVE-THORACIC ELEVEN FUSION, THORACIC EIGHT TRANSPEDICULAR DECOMPRESSION AND PARTIAL CORPECTOMY with O-Arm;  Surgeon: Debby Dorn MATSU, MD;  Location: [facility] OR;  Service: Neurosurgery;  Laterality: N/A;   LEFT HEART CATH AND CORONARY ANGIOGRAPHY N/A 04/12/2023   Procedure: LEFT HEART CATH AND CORONARY ANGIOGRAPHY;  Surgeon: Verlin Lonni BIRCH, MD;  [facility];  Service: Cardiovascular;  Laterality: N/A;   NASAL ENDOSCOPY Right 06/30/2023   Procedure: ENDOSCOPY, NOSE;  Surgeon: Jesus Oliphant, MD;  Location: [facility] OR;  Service: ENT;  Laterality: Right;  PERIPHERAL VASCULAR BALLOON ANGIOPLASTY  09/02/2021   Procedure: PERIPHERAL VASCULAR BALLOON ANGIOPLASTY;  Surgeon: Serene Gaile ORN, MD;  [facility];  Service: Cardiovascular;;   PERIPHERAL VASCULAR BALLOON ANGIOPLASTY   12/28/2021   Procedure: PERIPHERAL VASCULAR BALLOON ANGIOPLASTY;  Surgeon: Serene Gaile ORN, MD;  [facility];  Service: Cardiovascular;;  Posterior Tibial PTA only   POLYPECTOMY Right 06/30/2023   Procedure: POLYPECTOMY, NASAL CAVITY;  Surgeon: Jesus Oliphant, MD;  Location: [facility] OR;  Service: ENT;  Laterality: Right;   TEE WITHOUT CARDIOVERSION N/A 09/28/2017   Procedure: TRANSESOPHAGEAL ECHOCARDIOGRAM (TEE);  Surgeon: Francyne Headland, MD;  Location: [facility] ENDOSCOPY;  Service: Cardiovascular;  Laterality: N/A;   TEE WITHOUT CARDIOVERSION N/A 09/03/2021   Procedure: TRANSESOPHAGEAL ECHOCARDIOGRAM (TEE);  Surgeon: Raford Riggs, MD;  Location: [facility] ENDOSCOPY;  Service: Cardiovascular;  Laterality: N/A;   TEE WITHOUT CARDIOVERSION N/A 07/15/2022   Procedure: TRANSESOPHAGEAL ECHOCARDIOGRAM;  Surgeon: Francyne Headland, MD;  [facility];  Service: Cardiovascular;  Laterality: N/A;   TEE WITHOUT CARDIOVERSION N/A 04/18/2023   Procedure: TRANSESOPHAGEAL ECHOCARDIOGRAM (TEE);  Surgeon: Lucas Dorise POUR, MD;  Location: [facility] OR;  Service: Open Heart Surgery;  Laterality: N/A;   TOTAL HIP ARTHROPLASTY Left 07/14/2023   Procedure: ARTHROPLASTY, HIP, TOTAL, ANTERIOR APPROACH;  Surgeon: Kendal Franky SQUIBB, MD;  Location: [facility];  Service: Orthopedics;  Laterality: Left;   Patient Active Problem List   Diagnosis Date Noted   Left displaced femoral neck fracture (HCC) 07/13/2023   Closed left femoral fracture (HCC) 07/12/2023   History of CHF (congestive heart failure) 07/12/2023   Chronic kidney disease (CKD), stage III (moderate) (HCC) 07/12/2023   Hyperlipidemia 07/12/2023   BPH (benign prostatic hyperplasia) 07/12/2023   Peripheral neuropathy 07/12/2023   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   Bradycardia 04/14/2023   NSTEMI (non-ST elevated myocardial infarction) (HCC) 04/13/2023   Chronic bilateral low back pain 04/13/2023   History of CABG in  04/2023 04/12/2023   Acute decompensated heart  failure (HCC) 04/10/2023   Chronic pain syndrome 04/09/2023   MRSA infection greater than 3 months ago 04/09/2023   Hypoglycemia 10/06/2022   Pleural effusion 09/29/2022   Chronic systolic CHF (congestive heart failure) (HCC) 09/29/2022   Acute urinary retention 09/29/2022   Anxiety 09/29/2022   Hardware complicating wound infection (HCC) 09/25/2022   Discitis of thoracic region 09/25/2022   Hypokalemia 09/07/2022   Acute toxic encephalopathy 09/07/2022   Acute postoperative anemia due to expected blood loss 09/07/2022   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   Adjustment disorder with mixed anxiety and depressed mood 08/13/2022   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 08/08/2022   MRSA bacteremia 08/08/2022   AKI (acute kidney injury), ruled out 07/27/2022   Discitis thoracic region 07/27/2022   Bacteremia due to methicillin resistant Staphylococcus aureus 07/27/2022   Severe protein-calorie malnutrition (HCC) 07/26/2022   PVD (peripheral vascular disease) (HCC) 07/26/2022   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   Bacteremia 07/15/2022   Atelectasis 07/12/2022   Sepsis due to pneumonia (HCC) 07/12/2022   Acute on chronic diastolic (congestive) heart failure (HCC) 07/12/2022   Gangrene of toe of right foot (HCC)    Sepsis (HCC) 08/29/2021   SIRS (systemic inflammatory response syndrome) (HCC) 08/29/2021   Hyponatremia 08/29/2021   Hypomagnesemia 08/29/2021   Pure hypercholesterolemia 06/17/2021   Pain of left hand 09/06/2019   Cellulitis 08/26/2019   Persistent atrial fibrillation (HCC) 08/26/2019   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) 10/18/2018   Transaminitis 10/17/2018  Hyperbilirubinemia 10/17/2018   Leukocytosis 10/17/2018   Sinusitis 10/17/2018   Chronic anticoagulation 09/30/2017   Smoker 09/30/2017   Cardiomyopathy (HCC) 09/30/2017   Sleep apnea suspected 09/30/2017   Essential hypertension 09/26/2017   Insulin  dependent type 2 diabetes  mellitus (HCC) 09/26/2017   Permanent atrial fibrillation (HCC) 09/26/2017    PCP: Delbert Clam, MD  REFERRING PROVIDER: Kendal Franky SQUIBB, MD  REFERRING DIAG: [phone] (ICD-10-CM) - Status post total hip replacement, left  THERAPY DIAG:  Stiffness of left hip, not elsewhere classified  Pain in left hip  Cramp and spasm  Muscle weakness (generalized)  Difficulty in walking, not elsewhere classified  Unsteadiness on feet  Abnormal posture  Other abnormalities of gait and mobility  Rationale for Evaluation and Treatment: Rehabilitation  ONSET DATE: 09/26/2023  SUBJECTIVE:   SUBJECTIVE STATEMENT:   EVAL: Patient had series of orthopedic issues. He was in PT for back pain when he was found to have MRSA infection in spine May 2024, surgery followed for fusion/reconstruction, January had CABG, was still on cane once he got home and fell in bathroom and suffered hip fracture on left with subsequent THA. His THA was on 07/14/23. He was in rehab for several weeks and is now home. He felt he did not get aggressive enough PT in rehab. He then had home health and only did this 1 time per week. He did his exercises and is very motivated to get his function back. He is unsure if he will return to work. He is concerned about the physicality of the job with all that he has endured. He states this all depends on how much function he gets back. His goal is to be able to walk without the cane and build some upper body strength to be able to function at the level he did before and do his job.   PERTINENT HISTORY: Left LE neural impairment since back surgery; right toe amputations See above for extensive medical history  PAIN: 11/08/23 Are you having pain? Yes: NPRS scale: 4-6/10 Pain location: left hip Pain description: aching / stiff Aggravating factors: sitting Relieving factors: moving  PRECAUTIONS: Fall  RED FLAGS: None   WEIGHT BEARING RESTRICTIONS: No  FALLS:  Has patient  fallen in last 6 months? Yes. Number of falls 1  LIVING ENVIRONMENT: Lives with: lives with their spouse Lives in: House/apartment Stairs: No Has following equipment at home: Single point cane  OCCUPATION: chef  PLOF: Independent, Independent with basic ADLs, Independent with household mobility without device, Independent with community mobility without device, Independent with homemaking with ambulation, Independent with gait, and Independent with transfers  PATIENT GOALS: To be able to walk without the cane and build some upper body strength to be able to function at the level he did before and do his job.    NEXT MD VISIT: 2 months  OBJECTIVE:  Note: Objective measures were completed at Evaluation unless otherwise noted.  DIAGNOSTIC FINDINGS: na  PATIENT SURVEYS:  Eval:  LEFS : 30/80= 50% 11/08/2023:  Lower Extremity Functional Score: 26 / 80 = 32.5 %  COGNITION: Overall cognitive status: Within functional limits for tasks assessed     SENSATION: WFL   MUSCLE LENGTH: Hips tight bilaterally in hip flexors, quads and rotation both IR and ER   Left > Right  POSTURE: rounded shoulders, forward head, decreased lumbar lordosis, posterior pelvic tilt, and flexed trunk    LOWER EXTREMITY ROM:  WFL  LOWER EXTREMITY MMT:  MMT Right eval Left eval  Hip flexion  4  Hip extension  4-  Hip abduction  4  Hip adduction  4  Hip internal rotation  4  Hip external rotation  4  Knee flexion  4  Knee extension  4+  Ankle dorsiflexion    Ankle plantarflexion    Ankle inversion    Ankle eversion     (Blank rows = not tested)   FUNCTIONAL TESTS:  Eval: 5 times sit to stand: 15.14 sec (no UE use) Timed up and go (TUG): 14.00 sec  GAIT: Distance walked: 50 feet Assistive device utilized: Single point cane Level of assistance: SBA Comments: antalgic/guarded/ slow                                                                                                                                 TREATMENT DATE:   11/10/23:  11/08/2023: Seated hamstring stretch 2x20 sec bilat Observe right foot wound, wound is scabbed over and appears closed.  Pt states Podiatrist told him he could resume pool once scabbed over Sit to/from stand without UE support x10 Sit to/from stand holding 5# kettle bell x10 FWD step over 4 small hurdles in parallel bars down and back x3 laps (with UE support, as needed) Side steps over 4 small hurdles in parallel bars down and back x3 laps (with UE support, as needed) Side steps with red tband 3x 6 ft bilat (without UE support) Seated hamstring curl with blue tband 2x10 bilat Seated marching with blue tband around knees 2x8 Single leg standing on blue foam pod:  hip flexion, hip extension, hip abduction.  X10 each bilat Nustep level 5 x5 min with PT present to discuss status   11/06/23 Nustep level 6 x 6.5 min with PT present to discuss status Standing hamstring stretch on 8 inch step 2x20 sec bilat FWD step ups/over 8 inch step x10 bilat Standing calf stretch on slanted rocker board 2x20 sec FWD step over 3 hurdles by barre down and back x4 laps Side step over 3 hurdles by barre down and back x4 laps Seated hamstring curl with blue theraband 2x10 bilat Standing rows with blue theraband with marching 2x5 bilat Standing alt toe taps to 2nd step 2x10   11/01/2023: Nustep level 5 x6 min with PT present to discuss status Standing hamstring stretch at stairs 2x20 sec bilat FWD step ups/through to next step x10 bilat Standing calf stretch on slanted rocker board 2x20 sec FWD step over 3 hurdles by barre down and back x3 laps Side step over 3 hurdles by barre down and back x3 laps Seated hamstring curl with blue theraband 2x10 bilat Standing rows with blue theraband with marching 2x5 bilat Standing alt toe taps to 2nd step 2x10    ASSESSMENT:  CLINICAL IMPRESSION:    PATIENT EDUCATION:  Education details: Initiated HEP Person  educated: Patient Education method: Programmer, multimedia, Facilities manager, Verbal cues, and Handouts Education comprehension: verbalized understanding and returned demonstration  HOME EXERCISE PROGRAM: Access Code: [email or web address] URL: [email or web address] Date: 11/08/2023 Prepared by: Jarrell Menke  Exercises - Seated Hip Abduction with Resistance  - 1 x daily - 7 x weekly - 1 sets - 20 reps - Seated March with Resistance  - 1 x daily - 7 x weekly - 2 sets - 8 reps - Squat with Chair Touch  - 1 x daily - 7 x weekly - 4 sets - 5 reps - Side Stepping with Resistance at Ankles  - 1 x daily - 7 x weekly - 3 sets - 10 reps - Forward Step Touch  - 2-3 x daily - 7 x weekly - 1 sets - 5-8 reps - Standing 3-way Hip with Walker  - 2-3 x daily - 7 x weekly - 1 sets - 5-8 reps    OBJECTIVE IMPAIRMENTS: Abnormal gait, cardiopulmonary status limiting activity, decreased activity tolerance, decreased balance, decreased endurance, difficulty walking, decreased ROM, decreased strength, increased fascial restrictions, increased muscle spasms, impaired flexibility, impaired UE functional use, improper body mechanics, postural dysfunction, and pain.   ACTIVITY LIMITATIONS: carrying, lifting, bending, sitting, standing, squatting, sleeping, stairs, transfers, bed mobility, bathing, toileting, dressing, reach over head, hygiene/grooming, and caring for others  PARTICIPATION LIMITATIONS: meal prep, cleaning, laundry, driving, shopping, community activity, occupation, yard work, and church  PERSONAL FACTORS: Fitness, Past/current experiences, Profession, Time since onset of injury/illness/exacerbation, and 3+ comorbidities: spine surgery, CABG, diabetes and Htn are also affecting patient's functional outcome.   REHAB POTENTIAL: Good  CLINICAL DECISION MAKING: Unstable/unpredictable  EVALUATION COMPLEXITY: High   GOALS: Goals reviewed with patient? Yes  SHORT TERM GOALS: Target date: 10/26/2023  Pain  report to be no greater than 4/10  Baseline: Goal status: Met on 11/01/23  2.  Patient will be independent with initial HEP  Baseline:  Goal status: Met on 10/18/23  3.  Patient to be able to ambulate 50 feet without a.d Baseline:  Goal status: Met on 11/01/2023  4.  Patient to be able to do 10 sit to stand without UE support  Baseline:  Goal status: Met on 11/08/23    LONG TERM GOALS: Target date: 11/23/2023  Patient to report pain no greater than 2/10  Baseline:  Goal status: Progressing   2.  Patient to be independent with advanced HEP  Baseline:  Goal status: ongoing   3.  Patient to be able to stand or walk for at least 15 min without pain or need to rest Baseline:  Goal status: INITIAL  4.  Patient to be able to ascend and descend 12 steps safely Baseline:  Goal status: Ongoing   5.  Patient to report 85% improvement in overall symptoms and function Baseline:  Goal status: Ongoing  6.  Patient to be able to walk 500 feet without a.d. safely without rest breaks Baseline:  Goal status: Ongoing    PLAN:  PT FREQUENCY: 3x/week  PT DURATION: 8 weeks  PLANNED INTERVENTIONS: 97110-Therapeutic exercises, 97530- Therapeutic activity, 97112- Neuromuscular re-education, [phone]- Self Care, 02859- Manual therapy, [phone]- Gait training, [phone]- Aquatic Therapy, [phone]- Electrical stimulation (unattended), [phone]- Electrical stimulation (manual), [id number]- Vasopneumatic device, [id number]- Ultrasound, [id number]- Ionotophoresis 4mg /ml Dexamethasone , Patient/Family education, Balance training, Stair training, Taping, Joint mobilization, Spinal mobilization, Cryotherapy, and Moist heat  PLAN FOR NEXT SESSION: Nustep, continue functional strength training: he needs higher level functional training; update HEP, progress balance exercises  Delon Darner, PTA 11/10/23 11:56 AM     [facility] [address] [place], [place] [address]  Phone #  [phone] Fax [phone]

## 2023-11-13 ENCOUNTER — Encounter: Payer: Self-pay | Admitting: Physical Therapy

## 2023-11-13 ENCOUNTER — Ambulatory Visit: Admitting: Physical Therapy

## 2023-11-13 DIAGNOSIS — R252 Cramp and spasm: Secondary | ICD-10-CM

## 2023-11-13 DIAGNOSIS — M25652 Stiffness of left hip, not elsewhere classified: Secondary | ICD-10-CM

## 2023-11-13 DIAGNOSIS — M25552 Pain in left hip: Secondary | ICD-10-CM

## 2023-11-13 NOTE — Therapy (Signed)
 OUTPATIENT PHYSICAL THERAPY TREATMENT NOTE   Patient Name: David [name] MRN: [id number] DOB:08-Apr-1960, 63 y.o., male Today's Date: 11/13/2023  END OF SESSION:  PT End of Session - 11/13/23 1025     Visit Number 12    Date for PT Re-Evaluation 11/23/23    Authorization Type Medicaid no auth 27 visits    PT Start Time 0932    PT Stop Time 1014    PT Time Calculation (min) 42 min    Activity Tolerance Patient tolerated treatment well    Behavior During Therapy WFL for tasks assessed/performed                  Past Medical History:  Diagnosis Date   Cardiomyopathy (HCC)    a. EF 45% in 2019.   CHF (congestive heart failure) (HCC)    Chronic anticoagulation 09/30/2017   Diabetes mellitus type 2 in nonobese [facility])    Does not have health insurance    Essential hypertension 09/26/2017   Financial difficulties    H/O noncompliance with medical treatment, presenting hazards to health    Hardware complicating wound infection (HCC) 09/25/2022   Non-insulin  treated type 2 diabetes mellitus (HCC) 09/26/2017   Persistent atrial fibrillation (HCC)    Sleep apnea suspected 09/30/2017   years ago- not anymore per patient   Past Surgical History:  Procedure Laterality Date   ABDOMINAL AORTOGRAM W/LOWER EXTREMITY N/A 09/02/2021   Procedure: ABDOMINAL AORTOGRAM W/LOWER EXTREMITY;  Surgeon: Serene Gaile ORN, MD;  Location: [facility];  Service: Cardiovascular;  Laterality: N/A;   ABDOMINAL AORTOGRAM W/LOWER EXTREMITY N/A 12/28/2021   Procedure: ABDOMINAL AORTOGRAM W/LOWER EXTREMITY;  Surgeon: Serene Gaile ORN, MD;  Location: [facility];  Service: Cardiovascular;  Laterality: N/A;   ABDOMINAL AORTOGRAM W/LOWER EXTREMITY N/A 07/28/2022   Procedure: ABDOMINAL AORTOGRAM W/LOWER EXTREMITY;  Surgeon: Gretta Lonni PARAS, MD;  [facility]: [facility];  Service: Cardiovascular;  Laterality: N/A;   AMPUTATION Right 09/04/2021   Procedure: 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;   Surgeon: Harden Jerona GAILS, MD;  Location: [facility] OR;  Service: Orthopedics;  Laterality: Right;   AMPUTATION Right 08/02/2022   Procedure: AMPUTATION RAY 5TH METATARSAL OF RIGHT FOOT;  Surgeon: Barton Drape, MD;  Location: [facility] OR;  Service: Orthopedics;  Laterality: Right;   APPENDECTOMY  1971   BONE BIOPSY Right 08/02/2022   Procedure: BONE BIOPSY OF 5TH METATARSAL RIGHT FOOT;  Surgeon: Barton Drape, MD;  Location: [facility] OR;  Service: Orthopedics;  Laterality: Right;   CARDIOVERSION N/A 09/28/2017   Procedure: CARDIOVERSION;  Surgeon: Francyne Headland, MD;  [facility];  Service: Cardiovascular;  Laterality: N/A;   CLIPPING OF ATRIAL APPENDAGE N/A 04/18/2023   Procedure: CLIPPING OF ATRIAL APPENDAGE USING ATRICURE ATRICLIP [id number] with pulmonary vein isolation ablation;  Surgeon: Lucas Dorise POUR, MD;  Location: [facility] OR;  Service: Open Heart Surgery;  Laterality: N/A;   CORONARY ARTERY BYPASS GRAFT N/A 04/18/2023   Procedure: CORONARY ARTERY BYPASS GRAFTING (CABG) TIMES FOUR USING LEFT INTERNAL MAMMARY ARTERY AND ENDOSCOPICALLY HARVESTED LEFT GREATER SAPHENOUS VEIN;  Surgeon: Lucas Dorise POUR, MD;  Location: [facility];  Service: Open Heart Surgery;  Laterality: N/A;   IRRIGATION AND DEBRIDEMENT FOOT  08/02/2022   Procedure: IRRIGATION AND DEBRIDEMENT RIGHT 5TH METATARSAL FOOT;  Surgeon: Barton Drape, MD;  [facility];  Service: Orthopedics;;   LAMINECTOMY WITH POSTERIOR LATERAL ARTHRODESIS LEVEL 4 N/A 08/30/2022   Procedure: THORACIC FIVE-THORACIC ELEVEN FUSION, THORACIC EIGHT TRANSPEDICULAR DECOMPRESSION AND PARTIAL CORPECTOMY  with O-Arm;  Surgeon: Debby Dorn MATSU, MD;  Location: [facility] OR;  Service: Neurosurgery;  Laterality: N/A;   LEFT HEART CATH AND CORONARY ANGIOGRAPHY N/A 04/12/2023   Procedure: LEFT HEART CATH AND CORONARY ANGIOGRAPHY;  Surgeon: Verlin Lonni BIRCH, MD;  [facility];  Service: Cardiovascular;  Laterality: N/A;   NASAL ENDOSCOPY Right 06/30/2023    Procedure: ENDOSCOPY, NOSE;  Surgeon: Jesus Oliphant, MD;  [facility] OR;  Service: ENT;  Laterality: Right;   PERIPHERAL VASCULAR BALLOON ANGIOPLASTY  09/02/2021   Procedure: PERIPHERAL VASCULAR BALLOON ANGIOPLASTY;  Surgeon: Serene Gaile ORN, MD;  [facility];  Service: Cardiovascular;;   PERIPHERAL VASCULAR BALLOON ANGIOPLASTY  12/28/2021   Procedure: PERIPHERAL VASCULAR BALLOON ANGIOPLASTY;  Surgeon: Serene Gaile ORN, MD;  [facility];  Service: Cardiovascular;;  Posterior Tibial PTA only   POLYPECTOMY Right 06/30/2023   Procedure: POLYPECTOMY, NASAL CAVITY;  Surgeon: Jesus Oliphant, MD;  Location: [facility] OR;  Service: ENT;  Laterality: Right;   TEE WITHOUT CARDIOVERSION N/A 09/28/2017   Procedure: TRANSESOPHAGEAL ECHOCARDIOGRAM (TEE);  Surgeon: Francyne Headland, MD;  Location: [facility] ENDOSCOPY;  Service: Cardiovascular;  Laterality: N/A;   TEE WITHOUT CARDIOVERSION N/A 09/03/2021   Procedure: TRANSESOPHAGEAL ECHOCARDIOGRAM (TEE);  Surgeon: Raford Riggs, MD;  Location: [facility] ENDOSCOPY;  Service: Cardiovascular;  Laterality: N/A;   TEE WITHOUT CARDIOVERSION N/A 07/15/2022   Procedure: TRANSESOPHAGEAL ECHOCARDIOGRAM;  Surgeon: Francyne Headland, MD;  [facility];  Service: Cardiovascular;  Laterality: N/A;   TEE WITHOUT CARDIOVERSION N/A 04/18/2023   Procedure: TRANSESOPHAGEAL ECHOCARDIOGRAM (TEE);  Surgeon: Lucas Dorise POUR, MD;  Location: [facility] OR;  Service: Open Heart Surgery;  Laterality: N/A;   TOTAL HIP ARTHROPLASTY Left 07/14/2023   Procedure: ARTHROPLASTY, HIP, TOTAL, ANTERIOR APPROACH;  Surgeon: Kendal Franky SQUIBB, MD;  Location: [facility];  Service: Orthopedics;  Laterality: Left;   Patient Active Problem List   Diagnosis Date Noted   Left displaced femoral neck fracture (HCC) 07/13/2023   Closed left femoral fracture (HCC) 07/12/2023   History of CHF (congestive heart failure) 07/12/2023   Chronic kidney disease (CKD), stage III (moderate) (HCC)  07/12/2023   Hyperlipidemia 07/12/2023   BPH (benign prostatic hyperplasia) 07/12/2023   Peripheral neuropathy 07/12/2023   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   Bradycardia 04/14/2023   NSTEMI (non-ST elevated myocardial infarction) (HCC) 04/13/2023   Chronic bilateral low back pain 04/13/2023   History of CABG in  04/2023 04/12/2023   Acute decompensated heart failure (HCC) 04/10/2023   Chronic pain syndrome 04/09/2023   MRSA infection greater than 3 months ago 04/09/2023   Hypoglycemia 10/06/2022   Pleural effusion 09/29/2022   Chronic systolic CHF (congestive heart failure) (HCC) 09/29/2022   Acute urinary retention 09/29/2022   Anxiety 09/29/2022   Hardware complicating wound infection (HCC) 09/25/2022   Discitis of thoracic region 09/25/2022   Hypokalemia 09/07/2022   Acute toxic encephalopathy 09/07/2022   Acute postoperative anemia due to expected blood loss 09/07/2022   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   Adjustment disorder with mixed anxiety and depressed mood 08/13/2022   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 08/08/2022   MRSA bacteremia 08/08/2022   AKI (acute kidney injury), ruled out 07/27/2022   Discitis thoracic region 07/27/2022   Bacteremia due to methicillin resistant Staphylococcus aureus 07/27/2022   Severe protein-calorie malnutrition (HCC) 07/26/2022   PVD (peripheral vascular disease) (HCC) 07/26/2022   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   Bacteremia 07/15/2022   Atelectasis 07/12/2022   Sepsis due to pneumonia (  HCC) 07/12/2022   Acute on chronic diastolic (congestive) heart failure (HCC) 07/12/2022   Gangrene of toe of right foot (HCC)    Sepsis (HCC) 08/29/2021   SIRS (systemic inflammatory response syndrome) (HCC) 08/29/2021   Hyponatremia 08/29/2021   Hypomagnesemia 08/29/2021   Pure hypercholesterolemia 06/17/2021   Pain of left hand 09/06/2019   Cellulitis 08/26/2019   Persistent atrial fibrillation (HCC) 08/26/2019   HFimpEF (heart failure with  improved ejection fraction) (HCC) 10/18/2018   Transaminitis 10/17/2018   Hyperbilirubinemia 10/17/2018   Leukocytosis 10/17/2018   Sinusitis 10/17/2018   Chronic anticoagulation 09/30/2017   Smoker 09/30/2017   Cardiomyopathy (HCC) 09/30/2017   Sleep apnea suspected 09/30/2017   Essential hypertension 09/26/2017   Insulin  dependent type 2 diabetes mellitus (HCC) 09/26/2017   Permanent atrial fibrillation (HCC) 09/26/2017    PCP: Delbert Clam, MD  REFERRING PROVIDER: Kendal Franky SQUIBB, MD  REFERRING DIAG: [phone] (ICD-10-CM) - Status post total hip replacement, left  THERAPY DIAG:  Stiffness of left hip, not elsewhere classified  Pain in left hip  Cramp and spasm  Rationale for Evaluation and Treatment: Rehabilitation  ONSET DATE: 09/26/2023  SUBJECTIVE:   SUBJECTIVE STATEMENT:  Patient reports on 8/7 he was leaning over his recliner and he heard a crunching sensation on his left rib. He had pan after this so he went to the ED. MD said he probably tore the ligaments in between his ribs. Right now they are tender to the touch and it hurts when he tries to sit up from the bed.  EVAL: Patient had series of orthopedic issues. He was in PT for back pain when he was found to have MRSA infection in spine May 2024, surgery followed for fusion/reconstruction, January had CABG, was still on cane once he got home and fell in bathroom and suffered hip fracture on left with subsequent THA. His THA was on 07/14/23. He was in rehab for several weeks and is now home. He felt he did not get aggressive enough PT in rehab. He then had home health and only did this 1 time per week. He did his exercises and is very motivated to get his function back. He is unsure if he will return to work. He is concerned about the physicality of the job with all that he has endured. He states this all depends on how much function he gets back. His goal is to be able to walk without the cane and build some upper body  strength to be able to function at the level he did before and do his job.   PERTINENT HISTORY: Left LE neural impairment since back surgery; right toe amputations See above for extensive medical history  PAIN: 11/08/23 Are you having pain? Yes: NPRS scale: 4-6/10 Pain location: left hip Pain description: aching / stiff Aggravating factors: sitting Relieving factors: moving  PRECAUTIONS: Fall  RED FLAGS: None   WEIGHT BEARING RESTRICTIONS: No  FALLS:  Has patient fallen in last 6 months? Yes. Number of falls 1  LIVING ENVIRONMENT: Lives with: lives with their spouse Lives in: House/apartment Stairs: No Has following equipment at home: Single point cane  OCCUPATION: chef  PLOF: Independent, Independent with basic ADLs, Independent with household mobility without device, Independent with community mobility without device, Independent with homemaking with ambulation, Independent with gait, and Independent with transfers  PATIENT GOALS: To be able to walk without the cane and build some upper body strength to be able to function at the level he did before and  do his job.    NEXT MD VISIT: 2 months  OBJECTIVE:  Note: Objective measures were completed at Evaluation unless otherwise noted.  DIAGNOSTIC FINDINGS: na  PATIENT SURVEYS:  Eval:  LEFS : 30/80= 50% 11/08/2023:  Lower Extremity Functional Score: 26 / 80 = 32.5 %  COGNITION: Overall cognitive status: Within functional limits for tasks assessed     SENSATION: WFL   MUSCLE LENGTH: Hips tight bilaterally in hip flexors, quads and rotation both IR and ER   Left > Right  POSTURE: rounded shoulders, forward head, decreased lumbar lordosis, posterior pelvic tilt, and flexed trunk    LOWER EXTREMITY ROM:  WFL  LOWER EXTREMITY MMT:  MMT Right eval Left eval  Hip flexion  4  Hip extension  4-  Hip abduction  4  Hip adduction  4  Hip internal rotation  4  Hip external rotation  4  Knee flexion  4  Knee  extension  4+  Ankle dorsiflexion    Ankle plantarflexion    Ankle inversion    Ankle eversion     (Blank rows = not tested)   FUNCTIONAL TESTS:  Eval: 5 times sit to stand: 15.14 sec (no UE use) Timed up and go (TUG): 14.00 sec  GAIT: Distance walked: 50 feet Assistive device utilized: Single point cane Level of assistance: SBA Comments: antalgic/guarded/ slow                                                                                                                                TREATMENT DATE:  11/13/2023: Nustep level 6 x6 min with PT present to discuss status Seated hamstring stretch 2x20 sec bilat Calf stretch with rockerboard 2 x 30 sec bilateral  FWD step over 4 small hurdles in parallel bars down and back x3 laps (with UE support, as needed) Side steps over 4 small hurdles in parallel bars down and back x3 laps (with UE support, as needed) Tandem walking airex beam (forwards & backwards) x 3 laps (light UE support) Side stepping on airex beam x 3 laps UE support was needed Single leg standing on blue foam pod:  hip flexion, hip extension, hip abduction.  X10 each bilat Seated marching with blue tband around knees 2x10     11/08/2023: Seated hamstring stretch 2x20 sec bilat Observe right foot wound, wound is scabbed over and appears closed.  Pt states Podiatrist told him he could resume pool once scabbed over Sit to/from stand without UE support x10 Sit to/from stand holding 5# kettle bell x10 FWD step over 4 small hurdles in parallel bars down and back x3 laps (with UE support, as needed) Side steps over 4 small hurdles in parallel bars down and back x3 laps (with UE support, as needed) Side steps with red tband 3x 6 ft bilat (without UE support) Seated hamstring curl with blue tband 2x10 bilat Seated marching with blue tband around knees 2x8 Single leg standing  on blue foam pod:  hip flexion, hip extension, hip abduction.  X10 each bilat Nustep level 5 x5  min with PT present to discuss status   11/06/23 Nustep level 6 x 6.5 min with PT present to discuss status Standing hamstring stretch on 8 inch step 2x20 sec bilat FWD step ups/over 8 inch step x10 bilat Standing calf stretch on slanted rocker board 2x20 sec FWD step over 3 hurdles by barre down and back x4 laps Side step over 3 hurdles by barre down and back x4 laps Seated hamstring curl with blue theraband 2x10 bilat Standing rows with blue theraband with marching 2x5 bilat Standing alt toe taps to 2nd step 2x10   11/01/2023: Nustep level 5 x6 min with PT present to discuss status Standing hamstring stretch at stairs 2x20 sec bilat FWD step ups/through to next step x10 bilat Standing calf stretch on slanted rocker board 2x20 sec FWD step over 3 hurdles by barre down and back x3 laps Side step over 3 hurdles by barre down and back x3 laps Seated hamstring curl with blue theraband 2x10 bilat Standing rows with blue theraband with marching 2x5 bilat Standing alt toe taps to 2nd step 2x10    ASSESSMENT:  CLINICAL IMPRESSION: Erastus presents to therapy with increased left sided rib pain after bending to pick up an item while leaning over his recliner. He went to the ED and they said he probably torn/ straining an intercostal ligament. Modified exercises today to prevent rib discomfort. Incorporated more balance exercises on airex today. Patient required minimal to moderate UE support to maintain balance. Patient is progressing appropriately with skilled therapy. PT monitored patient throughout session and provided verbal and visual cues as needed. Patient will benefit from skilled PT to address the below impairments and improve overall function.     PATIENT EDUCATION:  Education details: Initiated HEP Person educated: Patient Education method: Programmer, multimedia, Facilities manager, Verbal cues, and Handouts Education comprehension: verbalized understanding and returned demonstration  HOME  EXERCISE PROGRAM: Access Code: [email or web address] URL: [email or web address] Date: 11/08/2023 Prepared by: Jarrell Menke  Exercises - Seated Hip Abduction with Resistance  - 1 x daily - 7 x weekly - 1 sets - 20 reps - Seated March with Resistance  - 1 x daily - 7 x weekly - 2 sets - 8 reps - Squat with Chair Touch  - 1 x daily - 7 x weekly - 4 sets - 5 reps - Side Stepping with Resistance at Ankles  - 1 x daily - 7 x weekly - 3 sets - 10 reps - Forward Step Touch  - 2-3 x daily - 7 x weekly - 1 sets - 5-8 reps - Standing 3-way Hip with Walker  - 2-3 x daily - 7 x weekly - 1 sets - 5-8 reps    OBJECTIVE IMPAIRMENTS: Abnormal gait, cardiopulmonary status limiting activity, decreased activity tolerance, decreased balance, decreased endurance, difficulty walking, decreased ROM, decreased strength, increased fascial restrictions, increased muscle spasms, impaired flexibility, impaired UE functional use, improper body mechanics, postural dysfunction, and pain.   ACTIVITY LIMITATIONS: carrying, lifting, bending, sitting, standing, squatting, sleeping, stairs, transfers, bed mobility, bathing, toileting, dressing, reach over head, hygiene/grooming, and caring for others  PARTICIPATION LIMITATIONS: meal prep, cleaning, laundry, driving, shopping, community activity, occupation, yard work, and church  PERSONAL FACTORS: Fitness, Past/current experiences, Profession, Time since onset of injury/illness/exacerbation, and 3+ comorbidities: spine surgery, CABG, diabetes and Htn are also affecting patient's functional outcome.   REHAB POTENTIAL: Good  CLINICAL DECISION MAKING:  Unstable/unpredictable  EVALUATION COMPLEXITY: High   GOALS: Goals reviewed with patient? Yes  SHORT TERM GOALS: Target date: 10/26/2023  Pain report to be no greater than 4/10  Baseline: Goal status: Met on 11/01/23  2.  Patient will be independent with initial HEP  Baseline:  Goal status: Met on 10/18/23  3.   Patient to be able to ambulate 50 feet without a.d Baseline:  Goal status: Met on 11/01/2023  4.  Patient to be able to do 10 sit to stand without UE support  Baseline:  Goal status: Met on 11/08/23    LONG TERM GOALS: Target date: 11/23/2023  Patient to report pain no greater than 2/10  Baseline:  Goal status: Progressing   2.  Patient to be independent with advanced HEP  Baseline:  Goal status: ongoing   3.  Patient to be able to stand or walk for at least 15 min without pain or need to rest Baseline:  Goal status: INITIAL  4.  Patient to be able to ascend and descend 12 steps safely Baseline:  Goal status: Ongoing   5.  Patient to report 85% improvement in overall symptoms and function Baseline:  Goal status: Ongoing  6.  Patient to be able to walk 500 feet without a.d. safely without rest breaks Baseline:  Goal status: Ongoing    PLAN:  PT FREQUENCY: 3x/week  PT DURATION: 8 weeks  PLANNED INTERVENTIONS: 97110-Therapeutic exercises, 97530- Therapeutic activity, 97112- Neuromuscular re-education, [phone]- Self Care, 02859- Manual therapy, [phone]- Gait training, [phone]- Aquatic Therapy, [phone]- Electrical stimulation (unattended), [phone]- Electrical stimulation (manual), 97016- Vasopneumatic device, 97035- Ultrasound, 02966- Ionotophoresis 4mg /ml Dexamethasone , Patient/Family education, Balance training, Stair training, Taping, Joint mobilization, Spinal mobilization, Cryotherapy, and Moist heat  PLAN FOR NEXT SESSION: ask about rib pain;  continue functional strength training: he needs higher level functional training; update HEP, progress balance exercises     Kristeen Sar, PT 11/13/23 10:26 AM [facility] [address] [place], [place] [address] Phone # [phone] Fax [phone]

## 2023-11-15 ENCOUNTER — Ambulatory Visit: Admitting: Physical Therapy

## 2023-11-15 ENCOUNTER — Encounter: Payer: Self-pay | Admitting: Physical Therapy

## 2023-11-15 DIAGNOSIS — M25552 Pain in left hip: Secondary | ICD-10-CM

## 2023-11-15 DIAGNOSIS — R252 Cramp and spasm: Secondary | ICD-10-CM

## 2023-11-15 DIAGNOSIS — M25652 Stiffness of left hip, not elsewhere classified: Secondary | ICD-10-CM | POA: Diagnosis not present

## 2023-11-15 NOTE — Therapy (Signed)
 OUTPATIENT PHYSICAL THERAPY TREATMENT NOTE   Patient Name: David [name] MRN: [id number] DOB:[birth year], 63 y.o., male Today's Date: 11/15/2023  END OF SESSION:  PT End of Session - 11/15/23 1552     Visit Number 13    Date for PT Re-Evaluation 11/23/23    Authorization Type Medicaid no auth 27 visits    PT Start Time 1402    PT Stop Time 1445    PT Time Calculation (min) 43 min    Activity Tolerance Patient tolerated treatment well    Behavior During Therapy WFL for tasks assessed/performed                   Past Medical History:  Diagnosis Date   Cardiomyopathy (HCC)    a. EF 45% in 2019.   CHF (congestive heart failure) (HCC)    Chronic anticoagulation 09/30/2017   Diabetes mellitus type 2 in nonobese [facility])    Does not have health insurance    Essential hypertension 09/26/2017   Financial difficulties    H/O noncompliance with medical treatment, presenting hazards to health    Hardware complicating wound infection (HCC) 09/25/2022   Non-insulin  treated type 2 diabetes mellitus (HCC) 09/26/2017   Persistent atrial fibrillation (HCC)    Sleep apnea suspected 09/30/2017   years ago- not anymore per patient   Past Surgical History:  Procedure Laterality Date   ABDOMINAL AORTOGRAM W/LOWER EXTREMITY N/A 09/02/2021   Procedure: ABDOMINAL AORTOGRAM W/LOWER EXTREMITY;  Surgeon: Serene Gaile ORN, MD;  [facility]: [facility];  Service: Cardiovascular;  Laterality: N/A;   ABDOMINAL AORTOGRAM W/LOWER EXTREMITY N/A 12/28/2021   Procedure: ABDOMINAL AORTOGRAM W/LOWER EXTREMITY;  Surgeon: Serene Gaile ORN, MD;  [facility]: [facility];  Service: Cardiovascular;  Laterality: N/A;   ABDOMINAL AORTOGRAM W/LOWER EXTREMITY N/A 07/28/2022   Procedure: ABDOMINAL AORTOGRAM W/LOWER EXTREMITY;  Surgeon: Gretta Lonni PARAS, MD;  [facility]: [facility];  Service: Cardiovascular;  Laterality: N/A;   AMPUTATION Right 09/04/2021   Procedure: 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;   Surgeon: Harden Jerona GAILS, MD;  Location: [facility] OR;  Service: Orthopedics;  Laterality: Right;   AMPUTATION Right 08/02/2022   Procedure: AMPUTATION RAY 5TH METATARSAL OF RIGHT FOOT;  Surgeon: Barton Drape, MD;  Location: [facility] OR;  Service: Orthopedics;  Laterality: Right;   APPENDECTOMY  1971   BONE BIOPSY Right 08/02/2022   Procedure: BONE BIOPSY OF 5TH METATARSAL RIGHT FOOT;  Surgeon: Barton Drape, MD;  [facility]: [facility];  Service: Orthopedics;  Laterality: Right;   CARDIOVERSION N/A 09/28/2017   Procedure: CARDIOVERSION;  Surgeon: Francyne Headland, MD;  [facility];  Service: Cardiovascular;  Laterality: N/A;   CLIPPING OF ATRIAL APPENDAGE N/A 04/18/2023   Procedure: CLIPPING OF ATRIAL APPENDAGE USING ATRICURE ATRICLIP [id number] with pulmonary vein isolation ablation;  Surgeon: Lucas Dorise POUR, MD;  Location: [facility] OR;  Service: Open Heart Surgery;  Laterality: N/A;   CORONARY ARTERY BYPASS GRAFT N/A 04/18/2023   Procedure: CORONARY ARTERY BYPASS GRAFTING (CABG) TIMES FOUR USING LEFT INTERNAL MAMMARY ARTERY AND ENDOSCOPICALLY HARVESTED LEFT GREATER SAPHENOUS VEIN;  Surgeon: Lucas Dorise POUR, MD;  [facility]: [facility];  Service: Open Heart Surgery;  Laterality: N/A;   IRRIGATION AND DEBRIDEMENT FOOT  08/02/2022   Procedure: IRRIGATION AND DEBRIDEMENT RIGHT 5TH METATARSAL FOOT;  Surgeon: Barton Drape, MD;  [facility];  Service: Orthopedics;;   LAMINECTOMY WITH POSTERIOR LATERAL ARTHRODESIS LEVEL 4 N/A 08/30/2022   Procedure: THORACIC FIVE-THORACIC ELEVEN FUSION, THORACIC EIGHT TRANSPEDICULAR DECOMPRESSION AND PARTIAL  CORPECTOMY with O-Arm;  Surgeon: Debby Dorn MATSU, MD;  Location: [facility] OR;  Service: Neurosurgery;  Laterality: N/A;   LEFT HEART CATH AND CORONARY ANGIOGRAPHY N/A 04/12/2023   Procedure: LEFT HEART CATH AND CORONARY ANGIOGRAPHY;  Surgeon: Verlin Lonni BIRCH, MD;  [facility];  Service: Cardiovascular;  Laterality: N/A;   NASAL ENDOSCOPY Right 06/30/2023    Procedure: ENDOSCOPY, NOSE;  Surgeon: Jesus Oliphant, MD;  Location: [facility] OR;  Service: ENT;  Laterality: Right;   PERIPHERAL VASCULAR BALLOON ANGIOPLASTY  09/02/2021   Procedure: PERIPHERAL VASCULAR BALLOON ANGIOPLASTY;  Surgeon: Serene Gaile ORN, MD;  [facility];  Service: Cardiovascular;;   PERIPHERAL VASCULAR BALLOON ANGIOPLASTY  12/28/2021   Procedure: PERIPHERAL VASCULAR BALLOON ANGIOPLASTY;  Surgeon: Serene Gaile ORN, MD;  [facility];  Service: Cardiovascular;;  Posterior Tibial PTA only   POLYPECTOMY Right 06/30/2023   Procedure: POLYPECTOMY, NASAL CAVITY;  Surgeon: Jesus Oliphant, MD;  Location: [facility] OR;  Service: ENT;  Laterality: Right;   TEE WITHOUT CARDIOVERSION N/A 09/28/2017   Procedure: TRANSESOPHAGEAL ECHOCARDIOGRAM (TEE);  Surgeon: Francyne Headland, MD;  Location: [facility] ENDOSCOPY;  Service: Cardiovascular;  Laterality: N/A;   TEE WITHOUT CARDIOVERSION N/A 09/03/2021   Procedure: TRANSESOPHAGEAL ECHOCARDIOGRAM (TEE);  Surgeon: Raford Riggs, MD;  Location: [facility] ENDOSCOPY;  Service: Cardiovascular;  Laterality: N/A;   TEE WITHOUT CARDIOVERSION N/A 07/15/2022   Procedure: TRANSESOPHAGEAL ECHOCARDIOGRAM;  Surgeon: Francyne Headland, MD;  [facility];  Service: Cardiovascular;  Laterality: N/A;   TEE WITHOUT CARDIOVERSION N/A 04/18/2023   Procedure: TRANSESOPHAGEAL ECHOCARDIOGRAM (TEE);  Surgeon: Lucas Dorise POUR, MD;  Location: [facility] OR;  Service: Open Heart Surgery;  Laterality: N/A;   TOTAL HIP ARTHROPLASTY Left 07/14/2023   Procedure: ARTHROPLASTY, HIP, TOTAL, ANTERIOR APPROACH;  Surgeon: Kendal Franky SQUIBB, MD;  Location[facility];  Service: Orthopedics;  Laterality: Left;   Patient Active Problem List   Diagnosis Date Noted   Left displaced femoral neck fracture (HCC) 07/13/2023   Closed left femoral fracture (HCC) 07/12/2023   History of CHF (congestive heart failure) 07/12/2023   Chronic kidney disease (CKD), stage III (moderate) (HCC)  07/12/2023   Hyperlipidemia 07/12/2023   BPH (benign prostatic hyperplasia) 07/12/2023   Peripheral neuropathy 07/12/2023   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   Bradycardia 04/14/2023   NSTEMI (non-ST elevated myocardial infarction) (HCC) 04/13/2023   Chronic bilateral low back pain 04/13/2023   History of CABG in  04/2023 04/12/2023   Acute decompensated heart failure (HCC) 04/10/2023   Chronic pain syndrome 04/09/2023   MRSA infection greater than 3 months ago 04/09/2023   Hypoglycemia 10/06/2022   Pleural effusion 09/29/2022   Chronic systolic CHF (congestive heart failure) (HCC) 09/29/2022   Acute urinary retention 09/29/2022   Anxiety 09/29/2022   Hardware complicating wound infection (HCC) 09/25/2022   Discitis of thoracic region 09/25/2022   Hypokalemia 09/07/2022   Acute toxic encephalopathy 09/07/2022   Acute postoperative anemia due to expected blood loss 09/07/2022   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   Adjustment disorder with mixed anxiety and depressed mood 08/13/2022   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 08/08/2022   MRSA bacteremia 08/08/2022   AKI (acute kidney injury), ruled out 07/27/2022   Discitis thoracic region 07/27/2022   Bacteremia due to methicillin resistant Staphylococcus aureus 07/27/2022   Severe protein-calorie malnutrition (HCC) 07/26/2022   PVD (peripheral vascular disease) (HCC) 07/26/2022   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   Bacteremia 07/15/2022   Atelectasis 07/12/2022   Sepsis due to  pneumonia (HCC) 07/12/2022   Acute on chronic diastolic (congestive) heart failure (HCC) 07/12/2022   Gangrene of toe of right foot (HCC)    Sepsis (HCC) 08/29/2021   SIRS (systemic inflammatory response syndrome) (HCC) 08/29/2021   Hyponatremia 08/29/2021   Hypomagnesemia 08/29/2021   Pure hypercholesterolemia 06/17/2021   Pain of left hand 09/06/2019   Cellulitis 08/26/2019   Persistent atrial fibrillation (HCC) 08/26/2019   HFimpEF (heart failure with  improved ejection fraction) (HCC) 10/18/2018   Transaminitis 10/17/2018   Hyperbilirubinemia 10/17/2018   Leukocytosis 10/17/2018   Sinusitis 10/17/2018   Chronic anticoagulation 09/30/2017   Smoker 09/30/2017   Cardiomyopathy (HCC) 09/30/2017   Sleep apnea suspected 09/30/2017   Essential hypertension 09/26/2017   Insulin  dependent type 2 diabetes mellitus (HCC) 09/26/2017   Permanent atrial fibrillation (HCC) 09/26/2017    PCP: Delbert Clam, MD  REFERRING PROVIDER: Kendal Franky SQUIBB, MD  REFERRING DIAG: [phone] (ICD-10-CM) - Status post total hip replacement, left  THERAPY DIAG:  Stiffness of left hip, not elsewhere classified  Pain in left hip  Cramp and spasm  Rationale for Evaluation and Treatment: Rehabilitation  ONSET DATE: 09/26/2023  SUBJECTIVE:   SUBJECTIVE STATEMENT:  Patient reports he is discombobulated today. His ribs still hurt when he lays on his side.  EVAL: Patient had series of orthopedic issues. He was in PT for back pain when he was found to have MRSA infection in spine May 2024, surgery followed for fusion/reconstruction, January had CABG, was still on cane once he got home and fell in bathroom and suffered hip fracture on left with subsequent THA. His THA was on 07/14/23. He was in rehab for several weeks and is now home. He felt he did not get aggressive enough PT in rehab. He then had home health and only did this 1 time per week. He did his exercises and is very motivated to get his function back. He is unsure if he will return to work. He is concerned about the physicality of the job with all that he has endured. He states this all depends on how much function he gets back. His goal is to be able to walk without the cane and build some upper body strength to be able to function at the level he did before and do his job.   PERTINENT HISTORY: Left LE neural impairment since back surgery; right toe amputations See above for extensive medical  history  PAIN: 11/08/23 Are you having pain? Yes: NPRS scale: 4-6/10 Pain location: left hip Pain description: aching / stiff Aggravating factors: sitting Relieving factors: moving  PRECAUTIONS: Fall  RED FLAGS: None   WEIGHT BEARING RESTRICTIONS: No  FALLS:  Has patient fallen in last 6 months? Yes. Number of falls 1  LIVING ENVIRONMENT: Lives with: lives with their spouse Lives in: House/apartment Stairs: No Has following equipment at home: Single point cane  OCCUPATION: chef  PLOF: Independent, Independent with basic ADLs, Independent with household mobility without device, Independent with community mobility without device, Independent with homemaking with ambulation, Independent with gait, and Independent with transfers  PATIENT GOALS: To be able to walk without the cane and build some upper body strength to be able to function at the level he did before and do his job.    NEXT MD VISIT: 2 months  OBJECTIVE:  Note: Objective measures were completed at Evaluation unless otherwise noted.  DIAGNOSTIC FINDINGS: na  PATIENT SURVEYS:  Eval:  LEFS : 30/80= 50% 11/08/2023:  Lower Extremity Functional Score: 26 /  80 = 32.5 %  COGNITION: Overall cognitive status: Within functional limits for tasks assessed     SENSATION: WFL   MUSCLE LENGTH: Hips tight bilaterally in hip flexors, quads and rotation both IR and ER   Left > Right  POSTURE: rounded shoulders, forward head, decreased lumbar lordosis, posterior pelvic tilt, and flexed trunk    LOWER EXTREMITY ROM:  WFL  LOWER EXTREMITY MMT:  MMT Right eval Left eval  Hip flexion  4  Hip extension  4-  Hip abduction  4  Hip adduction  4  Hip internal rotation  4  Hip external rotation  4  Knee flexion  4  Knee extension  4+  Ankle dorsiflexion    Ankle plantarflexion    Ankle inversion    Ankle eversion     (Blank rows = not tested)   FUNCTIONAL TESTS:  Eval: 5 times sit to stand: 15.14 sec (no UE  use) Timed up and go (TUG): 14.00 sec  GAIT: Distance walked: 50 feet Assistive device utilized: Single point cane Level of assistance: SBA Comments: antalgic/guarded/ slow                                                                                                                                TREATMENT DATE:  11/15/2023: Nustep level 6 x6 min with PT present to discuss status Seated hamstring stretch 2x30 sec bilat Calf stretch with rockerboard 2 x 30 sec bilateral  FWD step over 3 small hurdles + 1 airex in parallel bars down and back x3 laps (with UE support, as needed) Side steps over 3 small hurdles + 1 airex  in parallel bars down and back x3 laps (with UE support, as needed) Seated marching with blue tband around knees 2x10 Seated hamstring curl with blue tband 2x10 bilat Standing iso hip abduction with purple ball at wall x 10 bilateral  Single leg tap (forwards & lateral) x 10 taps each leg    11/13/2023: Nustep level 6 x6 min with PT present to discuss status Seated hamstring stretch 2x20 sec bilat Calf stretch with rockerboard 2 x 30 sec bilateral  FWD step over 4 small hurdles in parallel bars down and back x3 laps (with UE support, as needed) Side steps over 4 small hurdles in parallel bars down and back x3 laps (with UE support, as needed) Tandem walking airex beam (forwards & backwards) x 3 laps (light UE support) Side stepping on airex beam x 3 laps UE support was needed Single leg standing on blue foam pod:  hip flexion, hip extension, hip abduction.  X10 each bilat Seated marching with blue tband around knees 2x10     11/08/2023: Seated hamstring stretch 2x20 sec bilat Observe right foot wound, wound is scabbed over and appears closed.  Pt states Podiatrist told him he could resume pool once scabbed over Sit to/from stand without UE support x10 Sit to/from stand holding 5# kettle bell  x10 FWD step over 4 small hurdles in parallel bars down and back x3  laps (with UE support, as needed) Side steps over 4 small hurdles in parallel bars down and back x3 laps (with UE support, as needed) Side steps with red tband 3x 6 ft bilat (without UE support) Seated hamstring curl with blue tband 2x10 bilat Seated marching with blue tband around knees 2x8 Single leg standing on blue foam pod:  hip flexion, hip extension, hip abduction.  X10 each bilat Nustep level 5 x5 min with PT present to discuss status   11/06/23 Nustep level 6 x 6.5 min with PT present to discuss status Standing hamstring stretch on 8 inch step 2x20 sec bilat FWD step ups/over 8 inch step x10 bilat Standing calf stretch on slanted rocker board 2x20 sec FWD step over 3 hurdles by barre down and back x4 laps Side step over 3 hurdles by barre down and back x4 laps Seated hamstring curl with blue theraband 2x10 bilat Standing rows with blue theraband with marching 2x5 bilat Standing alt toe taps to 2nd step 2x10    ASSESSMENT:  CLINICAL IMPRESSION: Dajour still verbalizes tenderness in his ribs today. He verbalized some soreness after last treatment session. Added airex balance activities today and progressed glute strengthening which was a  good challenge for patient. He required min to mod A hand support with balance activities in the // bars. Discussed patient's progress and the frustrations with everything he has been through. Provided patient encouragement and ways he can continue to progress and get better. Patient will benefit from skilled PT to address the below impairments and improve overall function.      PATIENT EDUCATION:  Education details: Initiated HEP Person educated: Patient Education method: Programmer, multimedia, Facilities manager, Verbal cues, and Handouts Education comprehension: verbalized understanding and returned demonstration  HOME EXERCISE PROGRAM: Access Code: [email or web address] URL: [email or web address] Date: 11/08/2023 Prepared by: Jarrell  Menke  Exercises - Seated Hip Abduction with Resistance  - 1 x daily - 7 x weekly - 1 sets - 20 reps - Seated March with Resistance  - 1 x daily - 7 x weekly - 2 sets - 8 reps - Squat with Chair Touch  - 1 x daily - 7 x weekly - 4 sets - 5 reps - Side Stepping with Resistance at Ankles  - 1 x daily - 7 x weekly - 3 sets - 10 reps - Forward Step Touch  - 2-3 x daily - 7 x weekly - 1 sets - 5-8 reps - Standing 3-way Hip with Walker  - 2-3 x daily - 7 x weekly - 1 sets - 5-8 reps    OBJECTIVE IMPAIRMENTS: Abnormal gait, cardiopulmonary status limiting activity, decreased activity tolerance, decreased balance, decreased endurance, difficulty walking, decreased ROM, decreased strength, increased fascial restrictions, increased muscle spasms, impaired flexibility, impaired UE functional use, improper body mechanics, postural dysfunction, and pain.   ACTIVITY LIMITATIONS: carrying, lifting, bending, sitting, standing, squatting, sleeping, stairs, transfers, bed mobility, bathing, toileting, dressing, reach over head, hygiene/grooming, and caring for others  PARTICIPATION LIMITATIONS: meal prep, cleaning, laundry, driving, shopping, community activity, occupation, yard work, and church  PERSONAL FACTORS: Fitness, Past/current experiences, Profession, Time since onset of injury/illness/exacerbation, and 3+ comorbidities: spine surgery, CABG, diabetes and Htn are also affecting patient's functional outcome.   REHAB POTENTIAL: Good  CLINICAL DECISION MAKING: Unstable/unpredictable  EVALUATION COMPLEXITY: High   GOALS: Goals reviewed with patient? Yes  SHORT TERM GOALS: Target date: 10/26/2023  Pain report to be  no greater than 4/10  Baseline: Goal status: Met on 11/01/23  2.  Patient will be independent with initial HEP  Baseline:  Goal status: Met on 10/18/23  3.  Patient to be able to ambulate 50 feet without a.d Baseline:  Goal status: Met on 11/01/2023  4.  Patient to be able to do  10 sit to stand without UE support  Baseline:  Goal status: Met on 11/08/23    LONG TERM GOALS: Target date: 11/23/2023  Patient to report pain no greater than 2/10  Baseline:  Goal status: Progressing   2.  Patient to be independent with advanced HEP  Baseline:  Goal status: ongoing   3.  Patient to be able to stand or walk for at least 15 min without pain or need to rest Baseline:  Goal status: INITIAL  4.  Patient to be able to ascend and descend 12 steps safely Baseline:  Goal status: Ongoing   5.  Patient to report 85% improvement in overall symptoms and function Baseline:  Goal status: Ongoing  6.  Patient to be able to walk 500 feet without a.d. safely without rest breaks Baseline:  Goal status: Ongoing    PLAN:  PT FREQUENCY: 3x/week  PT DURATION: 8 weeks  PLANNED INTERVENTIONS: 97110-Therapeutic exercises, 97530- Therapeutic activity, 97112- Neuromuscular re-education, [phone]- Self Care, [id number]- Manual therapy, [phone]- Gait training, [phone]- Aquatic Therapy, [phone]- Electrical stimulation (unattended), [phone]- Electrical stimulation (manual), [id number]- Vasopneumatic device, [id number]- Ultrasound, [id number]- Ionotophoresis 4mg /ml Dexamethasone , Patient/Family education, Balance training, Stair training, Taping, Joint mobilization, Spinal mobilization, Cryotherapy, and Moist heat  PLAN FOR NEXT SESSION: functional strength training: he needs higher level functional training;  balance exercises     Kristeen Sar, PT 11/15/23 3:53 PM [facility] [address] [place], [place] [address] Phone # [phone] Fax [phone]

## 2023-11-17 ENCOUNTER — Encounter: Payer: Self-pay | Admitting: Physical Therapy

## 2023-11-17 ENCOUNTER — Ambulatory Visit: Admitting: Physical Therapy

## 2023-11-17 DIAGNOSIS — M25652 Stiffness of left hip, not elsewhere classified: Secondary | ICD-10-CM | POA: Diagnosis not present

## 2023-11-17 DIAGNOSIS — M6281 Muscle weakness (generalized): Secondary | ICD-10-CM

## 2023-11-17 DIAGNOSIS — R293 Abnormal posture: Secondary | ICD-10-CM

## 2023-11-17 DIAGNOSIS — R262 Difficulty in walking, not elsewhere classified: Secondary | ICD-10-CM

## 2023-11-17 DIAGNOSIS — R2689 Other abnormalities of gait and mobility: Secondary | ICD-10-CM

## 2023-11-17 DIAGNOSIS — R2681 Unsteadiness on feet: Secondary | ICD-10-CM

## 2023-11-17 DIAGNOSIS — M25552 Pain in left hip: Secondary | ICD-10-CM

## 2023-11-17 DIAGNOSIS — R252 Cramp and spasm: Secondary | ICD-10-CM

## 2023-11-17 NOTE — Therapy (Signed)
 OUTPATIENT PHYSICAL THERAPY TREATMENT NOTE   Patient Name: David [name] MRN: [id number] DOB:November 12, 1960, 63 y.o., male Today's Date: 11/17/2023  END OF SESSION:  PT End of Session - 11/17/23 1426     Visit Number 14    Date for PT Re-Evaluation 11/23/23    Authorization Type Medicaid no auth 27 visits    PT Start Time 1426    PT Stop Time 1515    PT Time Calculation (min) 49 min    Activity Tolerance Patient tolerated treatment well    Behavior During Therapy WFL for tasks assessed/performed                    Past Medical History:  Diagnosis Date   Cardiomyopathy (HCC)    a. EF 45% in 2019.   CHF (congestive heart failure) (HCC)    Chronic anticoagulation 09/30/2017   Diabetes mellitus type 2 in nonobese [facility])    Does not have health insurance    Essential hypertension 09/26/2017   Financial difficulties    H/O noncompliance with medical treatment, presenting hazards to health    Hardware complicating wound infection (HCC) 09/25/2022   Non-insulin  treated type 2 diabetes mellitus (HCC) 09/26/2017   Persistent atrial fibrillation (HCC)    Sleep apnea suspected 09/30/2017   years ago- not anymore per patient   Past Surgical History:  Procedure Laterality Date   ABDOMINAL AORTOGRAM W/LOWER EXTREMITY N/A 09/02/2021   Procedure: ABDOMINAL AORTOGRAM W/LOWER EXTREMITY;  Surgeon: Serene Gaile ORN, MD;  [facility];  Service: Cardiovascular;  Laterality: N/A;   ABDOMINAL AORTOGRAM W/LOWER EXTREMITY N/A 12/28/2021   Procedure: ABDOMINAL AORTOGRAM W/LOWER EXTREMITY;  Surgeon: Serene Gaile ORN, MD;  [facility]: [facility];  Service: Cardiovascular;  Laterality: N/A;   ABDOMINAL AORTOGRAM W/LOWER EXTREMITY N/A 07/28/2022   Procedure: ABDOMINAL AORTOGRAM W/LOWER EXTREMITY;  Surgeon: Gretta Lonni PARAS, MD;  [facility]: [facility];  Service: Cardiovascular;  Laterality: N/A;   AMPUTATION Right 09/04/2021   Procedure: AMPUTATION 4th  RAY  FOOT;  Surgeon: Harden Jerona GAILS, MD;  Location: [facility] OR;  Service: Orthopedics;  Laterality: Right;   AMPUTATION Right 08/02/2022   Procedure: AMPUTATION RAY 5TH METATARSAL OF RIGHT FOOT;  Surgeon: Barton Drape, MD;  Location: [facility];  Service: Orthopedics;  Laterality: Right;   APPENDECTOMY  1971   BONE BIOPSY Right 08/02/2022   Procedure: BONE BIOPSY OF 5TH METATARSAL RIGHT FOOT;  Surgeon: Barton Drape, MD;  [facility];  Service: Orthopedics;  Laterality: Right;   CARDIOVERSION N/A 09/28/2017   Procedure: CARDIOVERSION;  Surgeon: Francyne Headland, MD;  [facility];  Service: Cardiovascular;  Laterality: N/A;   CLIPPING OF ATRIAL APPENDAGE N/A 04/18/2023   Procedure: CLIPPING OF ATRIAL APPENDAGE USING ATRICURE ATRICLIP [id number] with pulmonary vein isolation ablation;  Surgeon: Lucas Dorise POUR, MD;  Location: [facility];  Service: Open Heart Surgery;  Laterality: N/A;   CORONARY ARTERY BYPASS GRAFT N/A 04/18/2023   Procedure: CORONARY ARTERY BYPASS GRAFTING (CABG) TIMES FOUR USING LEFT INTERNAL MAMMARY ARTERY AND ENDOSCOPICALLY HARVESTED LEFT GREATER SAPHENOUS VEIN;  Surgeon: Lucas Dorise POUR, MD;  [facility];  Service: Open Heart Surgery;  Laterality: N/A;   IRRIGATION AND DEBRIDEMENT FOOT  08/02/2022   Procedure: IRRIGATION AND DEBRIDEMENT RIGHT 5TH METATARSAL FOOT;  Surgeon: Barton Drape, MD;  [facility];  Service: Orthopedics;;   LAMINECTOMY WITH POSTERIOR LATERAL ARTHRODESIS LEVEL 4 N/A 08/30/2022   Procedure: THORACIC FIVE-THORACIC ELEVEN FUSION, THORACIC EIGHT TRANSPEDICULAR DECOMPRESSION AND  PARTIAL CORPECTOMY with O-Arm;  Surgeon: Debby Dorn MATSU, MD;  Location: [facility] OR;  Service: Neurosurgery;  Laterality: N/A;   LEFT HEART CATH AND CORONARY ANGIOGRAPHY N/A 04/12/2023   Procedure: LEFT HEART CATH AND CORONARY ANGIOGRAPHY;  Surgeon: Verlin Lonni BIRCH, MD;  [facility];  Service: Cardiovascular;  Laterality: N/A;   NASAL ENDOSCOPY Right  06/30/2023   Procedure: ENDOSCOPY, NOSE;  Surgeon: Jesus Oliphant, MD;  Location[facility] OR;  Service: ENT;  Laterality: Right;   PERIPHERAL VASCULAR BALLOON ANGIOPLASTY  09/02/2021   Procedure: PERIPHERAL VASCULAR BALLOON ANGIOPLASTY;  Surgeon: Serene Gaile ORN, MD;  [facility];  Service: Cardiovascular;;   PERIPHERAL VASCULAR BALLOON ANGIOPLASTY  12/28/2021   Procedure: PERIPHERAL VASCULAR BALLOON ANGIOPLASTY;  Surgeon: Serene Gaile ORN, MD;  [facility];  Service: Cardiovascular;;  Posterior Tibial PTA only   POLYPECTOMY Right 06/30/2023   Procedure: POLYPECTOMY, NASAL CAVITY;  Surgeon: Jesus Oliphant, MD;  Location[facility] OR;  Service: ENT;  Laterality: Right;   TEE WITHOUT CARDIOVERSION N/A 09/28/2017   Procedure: TRANSESOPHAGEAL ECHOCARDIOGRAM (TEE);  Surgeon: Francyne Headland, MD;  Location: [facility] ENDOSCOPY;  Service: Cardiovascular;  Laterality: N/A;   TEE WITHOUT CARDIOVERSION N/A 09/03/2021   Procedure: TRANSESOPHAGEAL ECHOCARDIOGRAM (TEE);  Surgeon: Raford Riggs, MD;  Location: [facility] ENDOSCOPY;  Service: Cardiovascular;  Laterality: N/A;   TEE WITHOUT CARDIOVERSION N/A 07/15/2022   Procedure: TRANSESOPHAGEAL ECHOCARDIOGRAM;  Surgeon: Francyne Headland, MD;  [facility];  Service: Cardiovascular;  Laterality: N/A;   TEE WITHOUT CARDIOVERSION N/A 04/18/2023   Procedure: TRANSESOPHAGEAL ECHOCARDIOGRAM (TEE);  Surgeon: Lucas Dorise POUR, MD;  Location: [facility] OR;  Service: Open Heart Surgery;  Laterality: N/A;   TOTAL HIP ARTHROPLASTY Left 07/14/2023   Procedure: ARTHROPLASTY, HIP, TOTAL, ANTERIOR APPROACH;  Surgeon: Kendal Franky SQUIBB, MD;  Location: [facility];  Service: Orthopedics;  Laterality: Left;   Patient Active Problem List   Diagnosis Date Noted   Left displaced femoral neck fracture (HCC) 07/13/2023   Closed left femoral fracture (HCC) 07/12/2023   History of CHF (congestive heart failure) 07/12/2023   Chronic kidney disease (CKD), stage III (moderate)  (HCC) 07/12/2023   Hyperlipidemia 07/12/2023   BPH (benign prostatic hyperplasia) 07/12/2023   Peripheral neuropathy 07/12/2023   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   Bradycardia 04/14/2023   NSTEMI (non-ST elevated myocardial infarction) (HCC) 04/13/2023   Chronic bilateral low back pain 04/13/2023   History of CABG in  04/2023 04/12/2023   Acute decompensated heart failure (HCC) 04/10/2023   Chronic pain syndrome 04/09/2023   MRSA infection greater than 3 months ago 04/09/2023   Hypoglycemia 10/06/2022   Pleural effusion 09/29/2022   Chronic systolic CHF (congestive heart failure) (HCC) 09/29/2022   Acute urinary retention 09/29/2022   Anxiety 09/29/2022   Hardware complicating wound infection (HCC) 09/25/2022   Discitis of thoracic region 09/25/2022   Hypokalemia 09/07/2022   Acute toxic encephalopathy 09/07/2022   Acute postoperative anemia due to expected blood loss 09/07/2022   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   Adjustment disorder with mixed anxiety and depressed mood 08/13/2022   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 08/08/2022   MRSA bacteremia 08/08/2022   AKI (acute kidney injury), ruled out 07/27/2022   Discitis thoracic region 07/27/2022   Bacteremia due to methicillin resistant Staphylococcus aureus 07/27/2022   Severe protein-calorie malnutrition (HCC) 07/26/2022   PVD (peripheral vascular disease) (HCC) 07/26/2022   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   Bacteremia 07/15/2022   Atelectasis 07/12/2022   Sepsis due  to pneumonia (HCC) 07/12/2022   Acute on chronic diastolic (congestive) heart failure (HCC) 07/12/2022   Gangrene of toe of right foot (HCC)    Sepsis (HCC) 08/29/2021   SIRS (systemic inflammatory response syndrome) (HCC) 08/29/2021   Hyponatremia 08/29/2021   Hypomagnesemia 08/29/2021   Pure hypercholesterolemia 06/17/2021   Pain of left hand 09/06/2019   Cellulitis 08/26/2019   Persistent atrial fibrillation (HCC) 08/26/2019   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) 10/18/2018   Transaminitis 10/17/2018   Hyperbilirubinemia 10/17/2018   Leukocytosis 10/17/2018   Sinusitis 10/17/2018   Chronic anticoagulation 09/30/2017   Smoker 09/30/2017   Cardiomyopathy (HCC) 09/30/2017   Sleep apnea suspected 09/30/2017   Essential hypertension 09/26/2017   Insulin  dependent type 2 diabetes mellitus (HCC) 09/26/2017   Permanent atrial fibrillation (HCC) 09/26/2017    PCP: Delbert Clam, MD  REFERRING PROVIDER: Kendal Franky SQUIBB, MD  REFERRING DIAG: [phone] (ICD-10-CM) - Status post total hip replacement, left  THERAPY DIAG:  Stiffness of left hip, not elsewhere classified  Pain in left hip  Cramp and spasm  Muscle weakness (generalized)  Difficulty in walking, not elsewhere classified  Unsteadiness on feet  Abnormal posture  Other abnormalities of gait and mobility  Rationale for Evaluation and Treatment: Rehabilitation  ONSET DATE: 09/26/2023  SUBJECTIVE:   SUBJECTIVE STATEMENT: My legs have been unusually painful last 2-3 days. Even tender to touch.   EVAL: Patient had series of orthopedic issues. He was in PT for back pain when he was found to have MRSA infection in spine May 2024, surgery followed for fusion/reconstruction, January had CABG, was still on cane once he got home and fell in bathroom and suffered hip fracture on left with subsequent THA. His THA was on 07/14/23. He was in rehab for several weeks and is now home. He felt he did not get aggressive enough PT in rehab. He then had home health and only did this 1 time per week. He did his exercises and is very motivated to get his function back. He is unsure if he will return to work. He is concerned about the physicality of the job with all that he has endured. He states this all depends on how much function he gets back. His goal is to be able to walk without the cane and build some upper body strength to be able to function at the level he did  before and do his job.   PERTINENT HISTORY: Left LE neural impairment since back surgery; right toe amputations See above for extensive medical history  PAIN: 11/08/23 Are you having pain? Yes: NPRS scale: 4-8/10 Pain location: right hip and left low back Pain description: aching / stiff Aggravating factors: standing > 20 min Relieving factors: moving  PRECAUTIONS: Fall  RED FLAGS: None   WEIGHT BEARING RESTRICTIONS: No  FALLS:  Has patient fallen in last 6 months? Yes. Number of falls 1  LIVING ENVIRONMENT: Lives with: lives with their spouse Lives in: House/apartment Stairs: No Has following equipment at home: Single point cane  OCCUPATION: chef  PLOF: Independent, Independent with basic ADLs, Independent with household mobility without device, Independent with community mobility without device, Independent with homemaking with ambulation, Independent with gait, and Independent with transfers  PATIENT GOALS: To be able to walk without the cane and build some upper body strength to be able to function at the level he did before and do his job.    NEXT MD VISIT: 2 months  OBJECTIVE:  Note:  Objective measures were completed at Evaluation unless otherwise noted.  DIAGNOSTIC FINDINGS: na  PATIENT SURVEYS:  Eval:  LEFS : 30/80= 50% 11/08/2023:  Lower Extremity Functional Score: 26 / 80 = 32.5 %  COGNITION: Overall cognitive status: Within functional limits for tasks assessed     SENSATION: WFL   MUSCLE LENGTH: Hips tight bilaterally in hip flexors, quads and rotation both IR and ER   Left > Right  POSTURE: rounded shoulders, forward head, decreased lumbar lordosis, posterior pelvic tilt, and flexed trunk    LOWER EXTREMITY ROM:  WFL  LOWER EXTREMITY MMT:  MMT Right eval Left eval  Hip flexion  4  Hip extension  4-  Hip abduction  4  Hip adduction  4  Hip internal rotation  4  Hip external rotation  4  Knee flexion  4  Knee extension  4+  Ankle  dorsiflexion    Ankle plantarflexion    Ankle inversion    Ankle eversion     (Blank rows = not tested)   FUNCTIONAL TESTS:  Eval: 5 times sit to stand: 15.14 sec (no UE use) Timed up and go (TUG): 14.00 sec  GAIT: Distance walked: 50 feet Assistive device utilized: Single point cane Level of assistance: SBA Comments: antalgic/guarded/ slow                                                                                                                                TREATMENT DATE:   11/17/23:Pt arrives for aquatic physical therapy. Treatment took place in 3.5-5.5 feet of water. Water temperature was 91 degrees F. Pt entered the pool via stairs independently. Seated water bench with 75% submersion Pt performed seated LE AROM exercises 20x in all planes, VC for deep breathing to help relax prior to more strenous exercises. 75% water depth: water walking with running arms 10x in each direction. VC to slow pace walking backwards for better control.  Hip kicks 20x Bil, requires holding on for balance. Sit to stand from 3rd step 3x5. Lateral step ups 2x5 Bil. Handrails area must for balance for each of those exercises. Seated decompression float 2 min for back pain, then 2 min of underwater bicycle, ending with 2 min of decompression.  11/15/2023: Nustep level 6 x6 min with PT present to discuss status Seated hamstring stretch 2x30 sec bilat Calf stretch with rockerboard 2 x 30 sec bilateral  FWD step over 3 small hurdles + 1 airex in parallel bars down and back x3 laps (with UE support, as needed) Side steps over 3 small hurdles + 1 airex  in parallel bars down and back x3 laps (with UE support, as needed) Seated marching with blue tband around knees 2x10 Seated hamstring curl with blue tband 2x10 bilat Standing iso hip abduction with purple ball at wall x 10 bilateral  Single leg tap (forwards & lateral) x 10 taps each leg    11/13/2023: Nustep level 6 x6  min with PT present to  discuss status Seated hamstring stretch 2x20 sec bilat Calf stretch with rockerboard 2 x 30 sec bilateral  FWD step over 4 small hurdles in parallel bars down and back x3 laps (with UE support, as needed) Side steps over 4 small hurdles in parallel bars down and back x3 laps (with UE support, as needed) Tandem walking airex beam (forwards & backwards) x 3 laps (light UE support) Side stepping on airex beam x 3 laps UE support was needed Single leg standing on blue foam pod:  hip flexion, hip extension, hip abduction.  X10 each bilat Seated marching with blue tband around knees 2x10      ASSESSMENT:   CLINICAL IMPRESSION: Pt reports increased back pain and Rt hip pain. Over the last 2-3 days. The pain was significantly less while exercising in the water today. Both sit to stand and step ups were very challlenging to his balance. Pt must rely heavily on his UE to help maintain balance in the first 1-2 sets but by set 3 it shows some improvement. Decompression significantly helped hos back pain today.      PATIENT EDUCATION:  Education details: Initiated HEP Person educated: Patient Education method: Programmer, multimedia, Facilities manager, Verbal cues, and Handouts Education comprehension: verbalized understanding and returned demonstration  HOME EXERCISE PROGRAM: Access Code: [email or web address] URL: [email or web address] Date: 11/08/2023 Prepared by: Jarrell Menke  Exercises - Seated Hip Abduction with Resistance  - 1 x daily - 7 x weekly - 1 sets - 20 reps - Seated March with Resistance  - 1 x daily - 7 x weekly - 2 sets - 8 reps - Squat with Chair Touch  - 1 x daily - 7 x weekly - 4 sets - 5 reps - Side Stepping with Resistance at Ankles  - 1 x daily - 7 x weekly - 3 sets - 10 reps - Forward Step Touch  - 2-3 x daily - 7 x weekly - 1 sets - 5-8 reps - Standing 3-way Hip with Walker  - 2-3 x daily - 7 x weekly - 1 sets - 5-8 reps    OBJECTIVE IMPAIRMENTS: Abnormal gait,  cardiopulmonary status limiting activity, decreased activity tolerance, decreased balance, decreased endurance, difficulty walking, decreased ROM, decreased strength, increased fascial restrictions, increased muscle spasms, impaired flexibility, impaired UE functional use, improper body mechanics, postural dysfunction, and pain.   ACTIVITY LIMITATIONS: carrying, lifting, bending, sitting, standing, squatting, sleeping, stairs, transfers, bed mobility, bathing, toileting, dressing, reach over head, hygiene/grooming, and caring for others  PARTICIPATION LIMITATIONS: meal prep, cleaning, laundry, driving, shopping, community activity, occupation, yard work, and church  PERSONAL FACTORS: Fitness, Past/current experiences, Profession, Time since onset of injury/illness/exacerbation, and 3+ comorbidities: spine surgery, CABG, diabetes and Htn are also affecting patient's functional outcome.   REHAB POTENTIAL: Good  CLINICAL DECISION MAKING: Unstable/unpredictable  EVALUATION COMPLEXITY: High   GOALS: Goals reviewed with patient? Yes  SHORT TERM GOALS: Target date: 10/26/2023  Pain report to be no greater than 4/10  Baseline: Goal status: Met on 11/01/23  2.  Patient will be independent with initial HEP  Baseline:  Goal status: Met on 10/18/23  3.  Patient to be able to ambulate 50 feet without a.d Baseline:  Goal status: Met on 11/01/2023  4.  Patient to be able to do 10 sit to stand without UE support  Baseline:  Goal status: Met on 11/08/23    LONG TERM GOALS: Target date: 11/23/2023  Patient to report pain no greater than 2/10  Baseline:  Goal status: Progressing   2.  Patient to be independent with advanced HEP  Baseline:  Goal status: ongoing   3.  Patient to be able to stand or walk for at least 15 min without pain or need to rest Baseline:  Goal status: INITIAL  4.  Patient to be able to ascend and descend 12 steps safely Baseline:  Goal status: Ongoing   5.  Patient  to report 85% improvement in overall symptoms and function Baseline:  Goal status: Ongoing  6.  Patient to be able to walk 500 feet without a.d. safely without rest breaks Baseline:  Goal status: Ongoing    PLAN:  PT FREQUENCY: 3x/week  PT DURATION: 8 weeks  PLANNED INTERVENTIONS: 97110-Therapeutic exercises, 97530- Therapeutic activity, 97112- Neuromuscular re-education, 97535- Self Care, 02859- Manual therapy, [phone]- Gait training, [phone]- Aquatic Therapy, [phone]- Electrical stimulation (unattended), [phone]- Electrical stimulation (manual), [id number]- Vasopneumatic device, [id number]- Ultrasound, 02966- Ionotophoresis 4mg /ml Dexamethasone , Patient/Family education, Balance training, Stair training, Taping, Joint mobilization, Spinal mobilization, Cryotherapy, and Moist heat  PLAN FOR NEXT SESSION: functional strength training: he needs higher level functional training;  balance exercises  Delon Darner, PTA 11/17/23 3:07 PM       [facility] [address], [address] [place], [place] [address] Phone # [phone] Fax [phone]

## 2023-11-20 ENCOUNTER — Ambulatory Visit: Admitting: Rehabilitative and Restorative Service Providers"

## 2023-11-22 ENCOUNTER — Ambulatory Visit: Admitting: Rehabilitative and Restorative Service Providers"

## 2023-11-24 ENCOUNTER — Ambulatory Visit: Admitting: Physical Therapy

## 2023-11-24 NOTE — Therapy (Deleted)
 OUTPATIENT PHYSICAL THERAPY TREATMENT NOTE   Patient Name: David [name] MRN: [id number] DOB:March 03, 1961, 63 y.o., male Today's Date: 11/24/2023  END OF SESSION:              Past Medical History:  Diagnosis Date   Cardiomyopathy (HCC)    a. EF 45% in 2019.   CHF (congestive heart failure) (HCC)    Chronic anticoagulation 09/30/2017   Diabetes mellitus type 2 in nonobese [facility])    Does not have health insurance    Essential hypertension 09/26/2017   Financial difficulties    H/O noncompliance with medical treatment, presenting hazards to health    Hardware complicating wound infection (HCC) 09/25/2022   Non-insulin  treated type 2 diabetes mellitus (HCC) 09/26/2017   Persistent atrial fibrillation (HCC)    Sleep apnea suspected 09/30/2017   years ago- not anymore per patient   Past Surgical History:  Procedure Laterality Date   ABDOMINAL AORTOGRAM W/LOWER EXTREMITY N/A 09/02/2021   Procedure: ABDOMINAL AORTOGRAM W/LOWER EXTREMITY;  Surgeon: Serene Gaile ORN, MD;  [facility]: [facility];  Service: Cardiovascular;  Laterality: N/A;   ABDOMINAL AORTOGRAM W/LOWER EXTREMITY N/A 12/28/2021   Procedure: ABDOMINAL AORTOGRAM W/LOWER EXTREMITY;  Surgeon: Serene Gaile ORN, MD;  [facility]: [facility];  Service: Cardiovascular;  Laterality: N/A;   ABDOMINAL AORTOGRAM W/LOWER EXTREMITY N/A 07/28/2022   Procedure: ABDOMINAL AORTOGRAM W/LOWER EXTREMITY;  Surgeon: Gretta Lonni PARAS, MD;  [facility]: [facility];  Service: Cardiovascular;  Laterality: N/A;   AMPUTATION Right 09/04/2021   Procedure: 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;  Surgeon: Harden Jerona GAILS, MD;  Location: [facility] OR;  Service: Orthopedics;  Laterality: Right;   AMPUTATION Right 08/02/2022   Procedure: AMPUTATION RAY 5TH METATARSAL OF RIGHT FOOT;  Surgeon: Barton Drape, MD;  Location: [facility];  Service: Orthopedics;  Laterality: Right;   APPENDECTOMY  1971   BONE BIOPSY Right 08/02/2022   Procedure: BONE  BIOPSY OF 5TH METATARSAL RIGHT FOOT;  Surgeon: Barton Drape, MD;  [facility];  Service: Orthopedics;  Laterality: Right;   CARDIOVERSION N/A 09/28/2017   Procedure: CARDIOVERSION;  Surgeon: Francyne Headland, MD;  [facility];  Service: Cardiovascular;  Laterality: N/A;   CLIPPING OF ATRIAL APPENDAGE N/A 04/18/2023   Procedure: CLIPPING OF ATRIAL APPENDAGE USING ATRICURE ATRICLIP [id number] with pulmonary vein isolation ablation;  Surgeon: Lucas Dorise POUR, MD;  [facility];  Service: Open Heart Surgery;  Laterality: N/A;   CORONARY ARTERY BYPASS GRAFT N/A 04/18/2023   Procedure: CORONARY ARTERY BYPASS GRAFTING (CABG) TIMES FOUR USING LEFT INTERNAL MAMMARY ARTERY AND ENDOSCOPICALLY HARVESTED LEFT GREATER SAPHENOUS VEIN;  Surgeon: Lucas Dorise POUR, MD;  [facility];  Service: Open Heart Surgery;  Laterality: N/A;   IRRIGATION AND DEBRIDEMENT FOOT  08/02/2022   Procedure: IRRIGATION AND DEBRIDEMENT RIGHT 5TH METATARSAL FOOT;  Surgeon: Barton Drape, MD;  [facility];  Service: Orthopedics;;   LAMINECTOMY WITH POSTERIOR LATERAL ARTHRODESIS LEVEL 4 N/A 08/30/2022   Procedure: THORACIC FIVE-THORACIC ELEVEN FUSION, THORACIC EIGHT TRANSPEDICULAR DECOMPRESSION AND PARTIAL CORPECTOMY with O-Arm;  Surgeon: Debby Dorn MATSU, MD;  Location: [facility] OR;  Service: Neurosurgery;  Laterality: N/A;   LEFT HEART CATH AND CORONARY ANGIOGRAPHY N/A 04/12/2023   Procedure: LEFT HEART CATH AND CORONARY ANGIOGRAPHY;  Surgeon: Verlin Lonni BIRCH, MD;  [facility];  Service: Cardiovascular;  Laterality: N/A;   NASAL ENDOSCOPY Right 06/30/2023   Procedure: ENDOSCOPY, NOSE;  Surgeon: Jesus Oliphant, MD;  Location: [facility] OR;  Service: ENT;  Laterality: Right;   PERIPHERAL VASCULAR BALLOON ANGIOPLASTY  09/02/2021   Procedure: PERIPHERAL VASCULAR BALLOON ANGIOPLASTY;  Surgeon: Serene Gaile ORN, MD;  [facility];  Service: Cardiovascular;;   PERIPHERAL VASCULAR BALLOON  ANGIOPLASTY  12/28/2021   Procedure: PERIPHERAL VASCULAR BALLOON ANGIOPLASTY;  Surgeon: Serene Gaile ORN, MD;  [facility];  Service: Cardiovascular;;  Posterior Tibial PTA only   POLYPECTOMY Right 06/30/2023   Procedure: POLYPECTOMY, NASAL CAVITY;  Surgeon: Jesus Oliphant, MD;  Location[facility] OR;  Service: ENT;  Laterality: Right;   TEE WITHOUT CARDIOVERSION N/A 09/28/2017   Procedure: TRANSESOPHAGEAL ECHOCARDIOGRAM (TEE);  Surgeon: Francyne Headland, MD;  Location: [facility] ENDOSCOPY;  Service: Cardiovascular;  Laterality: N/A;   TEE WITHOUT CARDIOVERSION N/A 09/03/2021   Procedure: TRANSESOPHAGEAL ECHOCARDIOGRAM (TEE);  Surgeon: Raford Riggs, MD;  Location: [facility] ENDOSCOPY;  Service: Cardiovascular;  Laterality: N/A;   TEE WITHOUT CARDIOVERSION N/A 07/15/2022   Procedure: TRANSESOPHAGEAL ECHOCARDIOGRAM;  Surgeon: Francyne Headland, MD;  [facility];  Service: Cardiovascular;  Laterality: N/A;   TEE WITHOUT CARDIOVERSION N/A 04/18/2023   Procedure: TRANSESOPHAGEAL ECHOCARDIOGRAM (TEE);  Surgeon: Lucas Dorise POUR, MD;  Location: [facility] OR;  Service: Open Heart Surgery;  Laterality: N/A;   TOTAL HIP ARTHROPLASTY Left 07/14/2023   Procedure: ARTHROPLASTY, HIP, TOTAL, ANTERIOR APPROACH;  Surgeon: Kendal Franky SQUIBB, MD;  Location[facility];  Service: Orthopedics;  Laterality: Left;   Patient Active Problem List   Diagnosis Date Noted   Left displaced femoral neck fracture (HCC) 07/13/2023   Closed left femoral fracture (HCC) 07/12/2023   History of CHF (congestive heart failure) 07/12/2023   Chronic kidney disease (CKD), stage III (moderate) (HCC) 07/12/2023   Hyperlipidemia 07/12/2023   BPH (benign prostatic hyperplasia) 07/12/2023   Peripheral neuropathy 07/12/2023   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   Bradycardia 04/14/2023   NSTEMI (non-ST elevated myocardial infarction) (HCC) 04/13/2023   Chronic bilateral low back pain 04/13/2023   History of CABG in  04/2023 04/12/2023   Acute  decompensated heart failure (HCC) 04/10/2023   Chronic pain syndrome 04/09/2023   MRSA infection greater than 3 months ago 04/09/2023   Hypoglycemia 10/06/2022   Pleural effusion 09/29/2022   Chronic systolic CHF (congestive heart failure) (HCC) 09/29/2022   Acute urinary retention 09/29/2022   Anxiety 09/29/2022   Hardware complicating wound infection (HCC) 09/25/2022   Discitis of thoracic region 09/25/2022   Hypokalemia 09/07/2022   Acute toxic encephalopathy 09/07/2022   Acute postoperative anemia due to expected blood loss 09/07/2022   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   Adjustment disorder with mixed anxiety and depressed mood 08/13/2022   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 08/08/2022   MRSA bacteremia 08/08/2022   AKI (acute kidney injury), ruled out 07/27/2022   Discitis thoracic region 07/27/2022   Bacteremia due to methicillin resistant Staphylococcus aureus 07/27/2022   Severe protein-calorie malnutrition (HCC) 07/26/2022   PVD (peripheral vascular disease) (HCC) 07/26/2022   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   Bacteremia 07/15/2022   Atelectasis 07/12/2022   Sepsis due to pneumonia (HCC) 07/12/2022   Acute on chronic diastolic (congestive) heart failure (HCC) 07/12/2022   Gangrene of toe of right foot (HCC)    Sepsis (HCC) 08/29/2021   SIRS (systemic inflammatory response syndrome) (HCC) 08/29/2021   Hyponatremia 08/29/2021   Hypomagnesemia 08/29/2021   Pure hypercholesterolemia 06/17/2021   Pain of left hand 09/06/2019   Cellulitis 08/26/2019   Persistent atrial fibrillation (HCC) 08/26/2019   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) 10/18/2018  Transaminitis 10/17/2018   Hyperbilirubinemia 10/17/2018   Leukocytosis 10/17/2018   Sinusitis 10/17/2018   Chronic anticoagulation 09/30/2017   Smoker 09/30/2017   Cardiomyopathy (HCC) 09/30/2017   Sleep apnea suspected 09/30/2017   Essential hypertension 09/26/2017   Insulin   dependent type 2 diabetes mellitus (HCC) 09/26/2017   Permanent atrial fibrillation (HCC) 09/26/2017    PCP: Delbert Clam, MD  REFERRING PROVIDER: Kendal Franky SQUIBB, MD  REFERRING DIAG: [phone] (ICD-10-CM) - Status post total hip replacement, left  THERAPY DIAG:  Stiffness of left hip, not elsewhere classified  Pain in left hip  Cramp and spasm  Muscle weakness (generalized)  Difficulty in walking, not elsewhere classified  Unsteadiness on feet  Abnormal posture  Other abnormalities of gait and mobility  Rationale for Evaluation and Treatment: Rehabilitation  ONSET DATE: 09/26/2023  SUBJECTIVE:   SUBJECTIVE STATEMENT:    EVAL: Patient had series of orthopedic issues. He was in PT for back pain when he was found to have MRSA infection in spine May 2024, surgery followed for fusion/reconstruction, January had CABG, was still on cane once he got home and fell in bathroom and suffered hip fracture on left with subsequent THA. His THA was on 07/14/23. He was in rehab for several weeks and is now home. He felt he did not get aggressive enough PT in rehab. He then had home health and only did this 1 time per week. He did his exercises and is very motivated to get his function back. He is unsure if he will return to work. He is concerned about the physicality of the job with all that he has endured. He states this all depends on how much function he gets back. His goal is to be able to walk without the cane and build some upper body strength to be able to function at the level he did before and do his job.   PERTINENT HISTORY: Left LE neural impairment since back surgery; right toe amputations See above for extensive medical history  PAIN: 11/08/23 Are you having pain? Yes: NPRS scale: 4-8/10 Pain location: right hip and left low back Pain description: aching / stiff Aggravating factors: standing > 20 min Relieving factors: moving  PRECAUTIONS: Fall  RED  FLAGS: None   WEIGHT BEARING RESTRICTIONS: No  FALLS:  Has patient fallen in last 6 months? Yes. Number of falls 1  LIVING ENVIRONMENT: Lives with: lives with their spouse Lives in: House/apartment Stairs: No Has following equipment at home: Single point cane  OCCUPATION: chef  PLOF: Independent, Independent with basic ADLs, Independent with household mobility without device, Independent with community mobility without device, Independent with homemaking with ambulation, Independent with gait, and Independent with transfers  PATIENT GOALS: To be able to walk without the cane and build some upper body strength to be able to function at the level he did before and do his job.    NEXT MD VISIT: 2 months  OBJECTIVE:  Note: Objective measures were completed at Evaluation unless otherwise noted.  DIAGNOSTIC FINDINGS: na  PATIENT SURVEYS:  Eval:  LEFS : 30/80= 50% 11/08/2023:  Lower Extremity Functional Score: 26 / 80 = 32.5 %  COGNITION: Overall cognitive status: Within functional limits for tasks assessed     SENSATION: WFL   MUSCLE LENGTH: Hips tight bilaterally in hip flexors, quads and rotation both IR and ER   Left > Right  POSTURE: rounded shoulders, forward head, decreased lumbar lordosis, posterior pelvic tilt, and flexed trunk    LOWER EXTREMITY ROM:  WFL  LOWER EXTREMITY MMT:  MMT Right eval Left eval  Hip flexion  4  Hip extension  4-  Hip abduction  4  Hip adduction  4  Hip internal rotation  4  Hip external rotation  4  Knee flexion  4  Knee extension  4+  Ankle dorsiflexion    Ankle plantarflexion    Ankle inversion    Ankle eversion     (Blank rows = not tested)   FUNCTIONAL TESTS:  Eval: 5 times sit to stand: 15.14 sec (no UE use) Timed up and go (TUG): 14.00 sec  GAIT: Distance walked: 50 feet Assistive device utilized: Single point cane Level of assistance: SBA Comments: antalgic/guarded/ slow                                                                                                                                 TREATMENT DATE:   8/22/2:Pt arrives for aquatic physical therapy. Treatment took place in 3.5-5.5 feet of water. Water temperature was 91 degrees F. Pt entered the pool via stairs independently. Seated water bench with 75% submersion Pt performed seated LE AROM exercises 20x in all planes, VC for deep breathing to help relax prior to more strenous exercises. 75% water depth: water walking with running arms 10x in each direction. VC to slow pace walking backwards for better control.  Hip kicks 20x Bil, requires holding on for balance. Sit to stand from 3rd step 3x5. Lateral step ups 2x5 Bil. Handrails area must for balance for each of those exercises. Seated decompression float 2 min for back pain, then 2 min of underwater bicycle, ending with 2 min of decompression.  11/17/23:Pt arrives for aquatic physical therapy. Treatment took place in 3.5-5.5 feet of water. Water temperature was 91 degrees F. Pt entered the pool via stairs independently. Seated water bench with 75% submersion Pt performed seated LE AROM exercises 20x in all planes, VC for deep breathing to help relax prior to more strenous exercises. 75% water depth: water walking with running arms 10x in each direction. VC to slow pace walking backwards for better control.  Hip kicks 20x Bil, requires holding on for balance. Sit to stand from 3rd step 3x5. Lateral step ups 2x5 Bil. Handrails area must for balance for each of those exercises. Seated decompression float 2 min for back pain, then 2 min of underwater bicycle, ending with 2 min of decompression.  11/15/2023: Nustep level 6 x6 min with PT present to discuss status Seated hamstring stretch 2x30 sec bilat Calf stretch with rockerboard 2 x 30 sec bilateral  FWD step over 3 small hurdles + 1 airex in parallel bars down and back x3 laps (with UE support, as needed) Side steps over 3 small hurdles +  1 airex  in parallel bars down and back x3 laps (with UE support, as needed) Seated marching with blue tband around knees 2x10 Seated hamstring curl with blue tband  2x10 bilat Standing iso hip abduction with purple ball at wall x 10 bilateral  Single leg tap (forwards & lateral) x 10 taps each leg     ASSESSMENT:   CLINICAL IMPRESSION:     PATIENT EDUCATION:  Education details: Initiated HEP Person educated: Patient Education method: Programmer, multimedia, Facilities manager, Verbal cues, and Handouts Education comprehension: verbalized understanding and returned demonstration  HOME EXERCISE PROGRAM: Access Code: [email or web address] URL: [email or web address] Date: 11/08/2023 Prepared by: Jarrell Menke  Exercises - Seated Hip Abduction with Resistance  - 1 x daily - 7 x weekly - 1 sets - 20 reps - Seated March with Resistance  - 1 x daily - 7 x weekly - 2 sets - 8 reps - Squat with Chair Touch  - 1 x daily - 7 x weekly - 4 sets - 5 reps - Side Stepping with Resistance at Ankles  - 1 x daily - 7 x weekly - 3 sets - 10 reps - Forward Step Touch  - 2-3 x daily - 7 x weekly - 1 sets - 5-8 reps - Standing 3-way Hip with Walker  - 2-3 x daily - 7 x weekly - 1 sets - 5-8 reps    OBJECTIVE IMPAIRMENTS: Abnormal gait, cardiopulmonary status limiting activity, decreased activity tolerance, decreased balance, decreased endurance, difficulty walking, decreased ROM, decreased strength, increased fascial restrictions, increased muscle spasms, impaired flexibility, impaired UE functional use, improper body mechanics, postural dysfunction, and pain.   ACTIVITY LIMITATIONS: carrying, lifting, bending, sitting, standing, squatting, sleeping, stairs, transfers, bed mobility, bathing, toileting, dressing, reach over head, hygiene/grooming, and caring for others  PARTICIPATION LIMITATIONS: meal prep, cleaning, laundry, driving, shopping, community activity, occupation, yard work, and church  PERSONAL  FACTORS: Fitness, Past/current experiences, Profession, Time since onset of injury/illness/exacerbation, and 3+ comorbidities: spine surgery, CABG, diabetes and Htn are also affecting patient's functional outcome.   REHAB POTENTIAL: Good  CLINICAL DECISION MAKING: Unstable/unpredictable  EVALUATION COMPLEXITY: High   GOALS: Goals reviewed with patient? Yes  SHORT TERM GOALS: Target date: 10/26/2023  Pain report to be no greater than 4/10  Baseline: Goal status: Met on 11/01/23  2.  Patient will be independent with initial HEP  Baseline:  Goal status: Met on 10/18/23  3.  Patient to be able to ambulate 50 feet without a.d Baseline:  Goal status: Met on 11/01/2023  4.  Patient to be able to do 10 sit to stand without UE support  Baseline:  Goal status: Met on 11/08/23    LONG TERM GOALS: Target date: 11/23/2023  Patient to report pain no greater than 2/10  Baseline:  Goal status: Progressing   2.  Patient to be independent with advanced HEP  Baseline:  Goal status: ongoing   3.  Patient to be able to stand or walk for at least 15 min without pain or need to rest Baseline:  Goal status: INITIAL  4.  Patient to be able to ascend and descend 12 steps safely Baseline:  Goal status: Ongoing   5.  Patient to report 85% improvement in overall symptoms and function Baseline:  Goal status: Ongoing  6.  Patient to be able to walk 500 feet without a.d. safely without rest breaks Baseline:  Goal status: Ongoing    PLAN:  PT FREQUENCY: 3x/week  PT DURATION: 8 weeks  PLANNED INTERVENTIONS: 97110-Therapeutic exercises, 97530- Therapeutic activity, [id number]- Neuromuscular re-education, 97535- Self Care, [id number]- Manual therapy, [id number]- Gait training, [phone]- Aquatic Therapy, [phone]- Electrical stimulation (unattended), [id number]- Electrical stimulation (manual), [id number]- Vasopneumatic device,  [id number]- Ultrasound, 02966- Ionotophoresis 4mg /ml Dexamethasone , Patient/Family education, Balance  training, Stair training, Taping, Joint mobilization, Spinal mobilization, Cryotherapy, and Moist heat  PLAN FOR NEXT SESSION: functional strength training: he needs higher level functional training;  balance exercises  Delon Darner, PTA 11/24/23 11:48 AM       [facility] [address], [address] [place], [place] [address] Phone # [phone] Fax [phone]

## 2023-11-28 ENCOUNTER — Ambulatory Visit

## 2023-11-28 DIAGNOSIS — R2681 Unsteadiness on feet: Secondary | ICD-10-CM

## 2023-11-28 DIAGNOSIS — R262 Difficulty in walking, not elsewhere classified: Secondary | ICD-10-CM

## 2023-11-28 DIAGNOSIS — R252 Cramp and spasm: Secondary | ICD-10-CM

## 2023-11-28 DIAGNOSIS — R293 Abnormal posture: Secondary | ICD-10-CM

## 2023-11-28 DIAGNOSIS — M25652 Stiffness of left hip, not elsewhere classified: Secondary | ICD-10-CM | POA: Diagnosis not present

## 2023-11-28 DIAGNOSIS — M6281 Muscle weakness (generalized): Secondary | ICD-10-CM

## 2023-11-28 DIAGNOSIS — M25552 Pain in left hip: Secondary | ICD-10-CM

## 2023-11-28 NOTE — Therapy (Addendum)
 OUTPATIENT PHYSICAL THERAPY TREATMENT NOTE   Patient Name: David [name] MRN: [id number] DOB:[birth year], 63 y.o., male Today's Date: 11/28/2023  END OF SESSION:  PT End of Session - 11/28/23 1532     Visit Number 15    Number of Visits 27    Date for PT Re-Evaluation 01/18/24    Authorization Type Medicaid no auth 27 visits    PT Start Time 1530    PT Stop Time 1615    PT Time Calculation (min) 45 min    Activity Tolerance Patient tolerated treatment well    Behavior During Therapy WFL for tasks assessed/performed                     Past Medical History:  Diagnosis Date   Cardiomyopathy (HCC)    a. EF 45% in 2019.   CHF (congestive heart failure) (HCC)    Chronic anticoagulation 09/30/2017   Diabetes mellitus type 2 in nonobese [facility])    Does not have health insurance    Essential hypertension 09/26/2017   Financial difficulties    H/O noncompliance with medical treatment, presenting hazards to health    Hardware complicating wound infection (HCC) 09/25/2022   Non-insulin  treated type 2 diabetes mellitus (HCC) 09/26/2017   Persistent atrial fibrillation (HCC)    Sleep apnea suspected 09/30/2017   years ago- not anymore per patient   Past Surgical History:  Procedure Laterality Date   ABDOMINAL AORTOGRAM W/LOWER EXTREMITY N/A 09/02/2021   Procedure: ABDOMINAL AORTOGRAM W/LOWER EXTREMITY;  Surgeon: Serene Gaile ORN, MD;  [facility]: [facility];  Service: Cardiovascular;  Laterality: N/A;   ABDOMINAL AORTOGRAM W/LOWER EXTREMITY N/A 12/28/2021   Procedure: ABDOMINAL AORTOGRAM W/LOWER EXTREMITY;  Surgeon: Serene Gaile ORN, MD;  [facility]: [facility];  Service: Cardiovascular;  Laterality: N/A;   ABDOMINAL AORTOGRAM W/LOWER EXTREMITY N/A 07/28/2022   Procedure: ABDOMINAL AORTOGRAM W/LOWER EXTREMITY;  Surgeon: Gretta Lonni PARAS, MD;  [facility]: [facility];  Service: Cardiovascular;  Laterality: N/A;   AMPUTATION Right 09/04/2021    Procedure: 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;  Surgeon: Harden Jerona GAILS, MD;  Location: [facility] OR;  Service: Orthopedics;  Laterality: Right;   AMPUTATION Right 08/02/2022   Procedure: AMPUTATION RAY 5TH METATARSAL OF RIGHT FOOT;  Surgeon: Barton Drape, MD;  Location: [facility];  Service: Orthopedics;  Laterality: Right;   APPENDECTOMY  1971   BONE BIOPSY Right 08/02/2022   Procedure: BONE BIOPSY OF 5TH METATARSAL RIGHT FOOT;  Surgeon: Barton Drape, MD;  Location: [facility];  Service: Orthopedics;  Laterality: Right;   CARDIOVERSION N/A 09/28/2017   Procedure: CARDIOVERSION;  Surgeon: Francyne Headland, MD;  [facility];  Service: Cardiovascular;  Laterality: N/A;   CLIPPING OF ATRIAL APPENDAGE N/A 04/18/2023   Procedure: CLIPPING OF ATRIAL APPENDAGE USING ATRICURE ATRICLIP [id number] with pulmonary vein isolation ablation;  Surgeon: Lucas Dorise POUR, MD;  Location: [facility];  Service: Open Heart Surgery;  Laterality: N/A;   CORONARY ARTERY BYPASS GRAFT N/A 04/18/2023   Procedure: CORONARY ARTERY BYPASS GRAFTING (CABG) TIMES FOUR USING LEFT INTERNAL MAMMARY ARTERY AND ENDOSCOPICALLY HARVESTED LEFT GREATER SAPHENOUS VEIN;  Surgeon: Lucas Dorise POUR, MD;  Location: [facility];  Service: Open Heart Surgery;  Laterality: N/A;   IRRIGATION AND DEBRIDEMENT FOOT  08/02/2022   Procedure: IRRIGATION AND DEBRIDEMENT RIGHT 5TH METATARSAL FOOT;  Surgeon: Barton Drape, MD;  [facility];  Service: Orthopedics;;   LAMINECTOMY WITH POSTERIOR LATERAL ARTHRODESIS LEVEL 4 N/A 08/30/2022   Procedure: THORACIC  FIVE-THORACIC ELEVEN FUSION, THORACIC EIGHT TRANSPEDICULAR DECOMPRESSION AND PARTIAL CORPECTOMY with O-Arm;  Surgeon: Debby Dorn MATSU, MD;  [facility];  Service: Neurosurgery;  Laterality: N/A;   LEFT HEART CATH AND CORONARY ANGIOGRAPHY N/A 04/12/2023   Procedure: LEFT HEART CATH AND CORONARY ANGIOGRAPHY;  Surgeon: Verlin Lonni BIRCH, MD;  [facility];  Service: Cardiovascular;  Laterality:  N/A;   NASAL ENDOSCOPY Right 06/30/2023   Procedure: ENDOSCOPY, NOSE;  Surgeon: Jesus Oliphant, MD;  Location: [facility] OR;  Service: ENT;  Laterality: Right;   PERIPHERAL VASCULAR BALLOON ANGIOPLASTY  09/02/2021   Procedure: PERIPHERAL VASCULAR BALLOON ANGIOPLASTY;  Surgeon: Serene Gaile ORN, MD;  [facility];  Service: Cardiovascular;;   PERIPHERAL VASCULAR BALLOON ANGIOPLASTY  12/28/2021   Procedure: PERIPHERAL VASCULAR BALLOON ANGIOPLASTY;  Surgeon: Serene Gaile ORN, MD;  [facility];  Service: Cardiovascular;;  Posterior Tibial PTA only   POLYPECTOMY Right 06/30/2023   Procedure: POLYPECTOMY, NASAL CAVITY;  Surgeon: Jesus Oliphant, MD;  Location: [facility] OR;  Service: ENT;  Laterality: Right;   TEE WITHOUT CARDIOVERSION N/A 09/28/2017   Procedure: TRANSESOPHAGEAL ECHOCARDIOGRAM (TEE);  Surgeon: Francyne Headland, MD;  Location: [facility]COPY;  Service: Cardiovascular;  Laterality: N/A;   TEE WITHOUT CARDIOVERSION N/A 09/03/2021   Procedure: TRANSESOPHAGEAL ECHOCARDIOGRAM (TEE);  Surgeon: Raford Riggs, MD;  Location: [facility] ENDOSCOPY;  Service: Cardiovascular;  Laterality: N/A;   TEE WITHOUT CARDIOVERSION N/A 07/15/2022   Procedure: TRANSESOPHAGEAL ECHOCARDIOGRAM;  Surgeon: Francyne Headland, MD;  [facility];  Service: Cardiovascular;  Laterality: N/A;   TEE WITHOUT CARDIOVERSION N/A 04/18/2023   Procedure: TRANSESOPHAGEAL ECHOCARDIOGRAM (TEE);  Surgeon: Lucas Dorise POUR, MD;  Location: [facility] OR;  Service: Open Heart Surgery;  Laterality: N/A;   TOTAL HIP ARTHROPLASTY Left 07/14/2023   Procedure: ARTHROPLASTY, HIP, TOTAL, ANTERIOR APPROACH;  Surgeon: Kendal Franky SQUIBB, MD;  Location: [facility];  Service: Orthopedics;  Laterality: Left;   Patient Active Problem List   Diagnosis Date Noted   Left displaced femoral neck fracture (HCC) 07/13/2023   Closed left femoral fracture (HCC) 07/12/2023   History of CHF (congestive heart failure) 07/12/2023   Chronic kidney disease  (CKD), stage III (moderate) (HCC) 07/12/2023   Hyperlipidemia 07/12/2023   BPH (benign prostatic hyperplasia) 07/12/2023   Peripheral neuropathy 07/12/2023   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   Bradycardia 04/14/2023   NSTEMI (non-ST elevated myocardial infarction) (HCC) 04/13/2023   Chronic bilateral low back pain 04/13/2023   History of CABG in  04/2023 04/12/2023   Acute decompensated heart failure (HCC) 04/10/2023   Chronic pain syndrome 04/09/2023   MRSA infection greater than 3 months ago 04/09/2023   Hypoglycemia 10/06/2022   Pleural effusion 09/29/2022   Chronic systolic CHF (congestive heart failure) (HCC) 09/29/2022   Acute urinary retention 09/29/2022   Anxiety 09/29/2022   Hardware complicating wound infection (HCC) 09/25/2022   Discitis of thoracic region 09/25/2022   Hypokalemia 09/07/2022   Acute toxic encephalopathy 09/07/2022   Acute postoperative anemia due to expected blood loss 09/07/2022   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   Adjustment disorder with mixed anxiety and depressed mood 08/13/2022   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 08/08/2022   MRSA bacteremia 08/08/2022   AKI (acute kidney injury), ruled out 07/27/2022   Discitis thoracic region 07/27/2022   Bacteremia due to methicillin resistant Staphylococcus aureus 07/27/2022   Severe protein-calorie malnutrition (HCC) 07/26/2022   PVD (peripheral vascular disease) (HCC) 07/26/2022   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   Bacteremia 07/15/2022  Atelectasis 07/12/2022   Sepsis due to pneumonia (HCC) 07/12/2022   Acute on chronic diastolic (congestive) heart failure (HCC) 07/12/2022   Gangrene of toe of right foot (HCC)    Sepsis (HCC) 08/29/2021   SIRS (systemic inflammatory response syndrome) (HCC) 08/29/2021   Hyponatremia 08/29/2021   Hypomagnesemia 08/29/2021   Pure hypercholesterolemia 06/17/2021   Pain of left hand 09/06/2019   Cellulitis 08/26/2019   Persistent atrial fibrillation (HCC) 08/26/2019    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) 10/18/2018   Transaminitis 10/17/2018   Hyperbilirubinemia 10/17/2018   Leukocytosis 10/17/2018   Sinusitis 10/17/2018   Chronic anticoagulation 09/30/2017   Smoker 09/30/2017   Cardiomyopathy (HCC) 09/30/2017   Sleep apnea suspected 09/30/2017   Essential hypertension 09/26/2017   Insulin  dependent type 2 diabetes mellitus (HCC) 09/26/2017   Permanent atrial fibrillation (HCC) 09/26/2017    PCP: Delbert Clam, MD  REFERRING PROVIDER: Kendal Franky SQUIBB, MD  REFERRING DIAG: [phone] (ICD-10-CM) - Status post total hip replacement, left  THERAPY DIAG:  Stiffness of left hip, not elsewhere classified  Muscle weakness (generalized)  Difficulty in walking, not elsewhere classified  Cramp and spasm  Pain in left hip  Unsteadiness on feet  Abnormal posture  Rationale for Evaluation and Treatment: Rehabilitation  ONSET DATE: 09/26/2023  SUBJECTIVE:   SUBJECTIVE STATEMENT:  Patient states he is doing good but still some discomfort across the low back.  We discussed how his pelvic asymmetry likely contributes to this. He rates his pain at 4/10 in the low back today.    EVAL: Patient had series of orthopedic issues. He was in PT for back pain when he was found to have MRSA infection in spine May 2024, surgery followed for fusion/reconstruction, January had CABG, was still on cane once he got home and fell in bathroom and suffered hip fracture on left with subsequent THA. His THA was on 07/14/23. He was in rehab for several weeks and is now home. He felt he did not get aggressive enough PT in rehab. He then had home health and only did this 1 time per week. He did his exercises and is very motivated to get his function back. He is unsure if he will return to work. He is concerned about the physicality of the job with all that he has endured. He states this all depends on how much function he gets back. His goal is to be  able to walk without the cane and build some upper body strength to be able to function at the level he did before and do his job.   PERTINENT HISTORY: Left LE neural impairment since back surgery; right toe amputations See above for extensive medical history  PAIN: 11/28/23 Are you having pain? Yes: NPRS scale: 4/10 Pain location: right hip and left low back Pain description: aching / stiff Aggravating factors: standing > 20 min Relieving factors: moving  PRECAUTIONS: Fall  RED FLAGS: None   WEIGHT BEARING RESTRICTIONS: No  FALLS:  Has patient fallen in last 6 months? Yes. Number of falls 1  LIVING ENVIRONMENT: Lives with: lives with their spouse Lives in: House/apartment Stairs: No Has following equipment at home: Single point cane  OCCUPATION: chef  PLOF: Independent, Independent with basic ADLs, Independent with household mobility without device, Independent with community mobility without device, Independent with homemaking with ambulation, Independent with gait, and Independent with transfers  PATIENT GOALS: To be able to walk without the cane and build some upper body strength to be able  to function at the level he did before and do his job.    NEXT MD VISIT: 2 months  OBJECTIVE:  Note: Objective measures were completed at Evaluation unless otherwise noted.  DIAGNOSTIC FINDINGS: na  PATIENT SURVEYS:  Eval:  LEFS : 30/80= 50% 11/08/2023:  Lower Extremity Functional Score: 26 / 80 = 32.5 % 11/28/2023:  Lower Extremity Functional Score: 38 / 80 = 47.5 %  COGNITION: Overall cognitive status: Within functional limits for tasks assessed     SENSATION: WFL   MUSCLE LENGTH: Hips tight bilaterally in hip flexors, quads and rotation both IR and ER   Left > Right  POSTURE: rounded shoulders, forward head, decreased lumbar lordosis, posterior pelvic tilt, and flexed trunk    LOWER EXTREMITY ROM:  WFL  LOWER EXTREMITY MMT:  MMT Right eval Left eval  Left 11/28/23  Hip flexion  4 4+  Hip extension  4- 4  Hip abduction  4 4+  Hip adduction  4 4+  Hip internal rotation  4 4  Hip external rotation  4 4+  Knee flexion  4 4+  Knee extension  4+ 4+  Ankle dorsiflexion     Ankle plantarflexion     Ankle inversion     Ankle eversion      (Blank rows = not tested)   FUNCTIONAL TESTS:  Eval: 5 times sit to stand: 15.14 sec (no UE use) Timed up and go (TUG): 14.00 sec  11/28/23: 5 times sit to stand: 11.67 sec (no UE use) Timed up and go (TUG): 9.34 sec  GAIT: Distance walked: 50 feet Assistive device utilized: Single point cane Level of assistance: SBA Comments: antalgic/guarded/ slow                                                                                                                                TREATMENT DATE:  11/28/2023: Recert assessment completed Nustep level 6 x 6 min with PT present to discuss status Lateral band walks with yellow loop x 5 laps at back counter Seated clamshell with red loop x 20 Standing hip ER/abduction with yellow loop 2 x 10 Hook lying clam with yellow loop x 20 Side lying clam with no resistance 2 x 10 each LE Hook lying bridge 2 x 10 Standing alternating tap up on cone x 20 Star drill using slider x 5 each side (5 points)  8/22/2:Pt arrives for aquatic physical therapy. Treatment took place in 3.5-5.5 feet of water. Water temperature was 91 degrees F. Pt entered the pool via stairs independently. Seated water bench with 75% submersion Pt performed seated LE AROM exercises 20x in all planes, VC for deep breathing to help relax prior to more strenous exercises. 75% water depth: water walking with running arms 10x in each direction. VC to slow pace walking backwards for better control.  Hip kicks 20x Bil, requires holding on for balance. Sit to stand from 3rd step 3x5. Lateral  step ups 2x5 Bil. Handrails area must for balance for each of those exercises. Seated decompression float 2 min  for back pain, then 2 min of underwater bicycle, ending with 2 min of decompression.  11/17/23:Pt arrives for aquatic physical therapy. Treatment took place in 3.5-5.5 feet of water. Water temperature was 91 degrees F. Pt entered the pool via stairs independently. Seated water bench with 75% submersion Pt performed seated LE AROM exercises 20x in all planes, VC for deep breathing to help relax prior to more strenous exercises. 75% water depth: water walking with running arms 10x in each direction. VC to slow pace walking backwards for better control.  Hip kicks 20x Bil, requires holding on for balance. Sit to stand from 3rd step 3x5. Lateral step ups 2x5 Bil. Handrails area must for balance for each of those exercises. Seated decompression float 2 min for back pain, then 2 min of underwater bicycle, ending with 2 min of decompression.  ASSESSMENT:   CLINICAL IMPRESSION:  [place] has made significant progress, functionally.  He is able to walk without his cane on level surfaces.  His functional scores are much improved.  He Is tolerating higher level balance tasks.  He continues to be weak bilateral hips but has improved by 1/2 grade on most of his MMT's.   He is very well motivated and compliant. He would benefit from continued skilled PT to continue to progress toward goals below.    PATIENT EDUCATION:  Education details: Initiated HEP Person educated: Patient Education method: Programmer, multimedia, Facilities manager, Verbal cues, and Handouts Education comprehension: verbalized understanding and returned demonstration  HOME EXERCISE PROGRAM: Access Code: [email or web address] URL: [email or web address] Date: 11/08/2023 Prepared by: Jarrell Menke  Exercises - Seated Hip Abduction with Resistance  - 1 x daily - 7 x weekly - 1 sets - 20 reps - Seated March with Resistance  - 1 x daily - 7 x weekly - 2 sets - 8 reps - Squat with Chair Touch  - 1 x daily - 7 x weekly - 4 sets - 5 reps - Side Stepping with  Resistance at Ankles  - 1 x daily - 7 x weekly - 3 sets - 10 reps - Forward Step Touch  - 2-3 x daily - 7 x weekly - 1 sets - 5-8 reps - Standing 3-way Hip with Walker  - 2-3 x daily - 7 x weekly - 1 sets - 5-8 reps    OBJECTIVE IMPAIRMENTS: Abnormal gait, cardiopulmonary status limiting activity, decreased activity tolerance, decreased balance, decreased endurance, difficulty walking, decreased ROM, decreased strength, increased fascial restrictions, increased muscle spasms, impaired flexibility, impaired UE functional use, improper body mechanics, postural dysfunction, and pain.   ACTIVITY LIMITATIONS: carrying, lifting, bending, sitting, standing, squatting, sleeping, stairs, transfers, bed mobility, bathing, toileting, dressing, reach over head, hygiene/grooming, and caring for others  PARTICIPATION LIMITATIONS: meal prep, cleaning, laundry, driving, shopping, community activity, occupation, yard work, and church  PERSONAL FACTORS: Fitness, Past/current experiences, Profession, Time since onset of injury/illness/exacerbation, and 3+ comorbidities: spine surgery, CABG, diabetes and Htn are also affecting patient's functional outcome.   REHAB POTENTIAL: Good  CLINICAL DECISION MAKING: Unstable/unpredictable  EVALUATION COMPLEXITY: High   GOALS: Goals reviewed with patient? Yes  SHORT TERM GOALS: Target date: 10/26/2023  Pain report to be no greater than 4/10  Baseline: Goal status: Met on 11/01/23  2.  Patient will be independent with initial HEP  Baseline:  Goal status: Met on 10/18/23  3.  Patient to be able to ambulate 50  feet without a.d Baseline:  Goal status: Met on 11/01/2023  4.  Patient to be able to do 10 sit to stand without UE support  Baseline:  Goal status: Met on 11/08/23    LONG TERM GOALS: Target date: 11/23/2023  Patient to report pain no greater than 2/10  Baseline:  Goal status: Progressing   2.  Patient to be independent with advanced HEP  Baseline:   Goal status: ongoing   3.  Patient to be able to stand or walk for at least 15 min without pain or need to rest Baseline:  Goal status: INITIAL  4.  Patient to be able to ascend and descend 12 steps safely Baseline:  Goal status: Ongoing   5.  Patient to report 85% improvement in overall symptoms and function Baseline:  Goal status: Ongoing  6.  Patient to be able to walk 500 feet without a.d. safely without rest breaks Baseline:  Goal status: Ongoing    PLAN:  PT FREQUENCY: 3x/week  PT DURATION: 8 weeks  PLANNED INTERVENTIONS: 97110-Therapeutic exercises, 97530- Therapeutic activity, [id number]- Neuromuscular re-education, 97535- Self Care, [id number]- Manual therapy, [phone]- Gait training, [phone]- Aquatic Therapy, [phone]- Electrical stimulation (unattended), [phone]- Electrical stimulation (manual), [id number]- Vasopneumatic device, [id number]- Ultrasound, [id number]- Ionotophoresis 4mg /ml Dexamethasone , Patient/Family education, Balance training, Stair training, Taping, Joint mobilization, Spinal mobilization, Cryotherapy, and Moist heat  PLAN FOR NEXT SESSION: Continue higher level functional training; emphasis on hip stability,  balance exercises   Thoren Hosang B. Keondra Haydu, PT 11/28/23 8:30 PM [facility] [address] [place], [place] [address] Phone # [phone] Fax [phone]

## 2023-11-30 ENCOUNTER — Other Ambulatory Visit: Payer: Self-pay

## 2023-12-01 ENCOUNTER — Other Ambulatory Visit: Payer: Self-pay

## 2023-12-01 ENCOUNTER — Other Ambulatory Visit (HOSPITAL_COMMUNITY): Payer: Self-pay

## 2023-12-08 ENCOUNTER — Telehealth: Payer: Self-pay | Admitting: Pharmacist

## 2023-12-08 DIAGNOSIS — E7800 Pure hypercholesterolemia, unspecified: Secondary | ICD-10-CM

## 2023-12-08 NOTE — Telephone Encounter (Signed)
 Repatha  and Vascepa  added to max tolerated statin apx 3 months back. Follow up lab is due. Call to remind pt . N/A LVM. MyChart sent.

## 2023-12-11 ENCOUNTER — Ambulatory Visit

## 2023-12-13 ENCOUNTER — Ambulatory Visit: Payer: Self-pay | Attending: Student | Admitting: Physical Therapy

## 2023-12-13 DIAGNOSIS — M6281 Muscle weakness (generalized): Secondary | ICD-10-CM | POA: Insufficient documentation

## 2023-12-13 DIAGNOSIS — R262 Difficulty in walking, not elsewhere classified: Secondary | ICD-10-CM | POA: Insufficient documentation

## 2023-12-13 DIAGNOSIS — R2681 Unsteadiness on feet: Secondary | ICD-10-CM | POA: Insufficient documentation

## 2023-12-13 DIAGNOSIS — R293 Abnormal posture: Secondary | ICD-10-CM | POA: Insufficient documentation

## 2023-12-13 DIAGNOSIS — M25652 Stiffness of left hip, not elsewhere classified: Secondary | ICD-10-CM | POA: Insufficient documentation

## 2023-12-13 DIAGNOSIS — R252 Cramp and spasm: Secondary | ICD-10-CM | POA: Insufficient documentation

## 2023-12-13 DIAGNOSIS — M25552 Pain in left hip: Secondary | ICD-10-CM | POA: Insufficient documentation

## 2023-12-17 ENCOUNTER — Other Ambulatory Visit: Payer: Self-pay | Admitting: Family Medicine

## 2023-12-18 ENCOUNTER — Other Ambulatory Visit (HOSPITAL_COMMUNITY): Payer: Self-pay

## 2023-12-18 ENCOUNTER — Encounter: Payer: Self-pay | Admitting: Physical Therapy

## 2023-12-18 ENCOUNTER — Ambulatory Visit (HOSPITAL_BASED_OUTPATIENT_CLINIC_OR_DEPARTMENT_OTHER): Payer: Self-pay | Admitting: Family

## 2023-12-18 ENCOUNTER — Other Ambulatory Visit: Payer: Self-pay

## 2023-12-18 ENCOUNTER — Ambulatory Visit: Admitting: Physical Therapy

## 2023-12-18 VITALS — BP 132/83 | HR 91 | Ht 72.0 in | Wt 190.0 lb

## 2023-12-18 DIAGNOSIS — R2681 Unsteadiness on feet: Secondary | ICD-10-CM | POA: Diagnosis present

## 2023-12-18 DIAGNOSIS — M25652 Stiffness of left hip, not elsewhere classified: Secondary | ICD-10-CM | POA: Diagnosis present

## 2023-12-18 DIAGNOSIS — F4321 Adjustment disorder with depressed mood: Secondary | ICD-10-CM

## 2023-12-18 DIAGNOSIS — R262 Difficulty in walking, not elsewhere classified: Secondary | ICD-10-CM

## 2023-12-18 DIAGNOSIS — R252 Cramp and spasm: Secondary | ICD-10-CM

## 2023-12-18 DIAGNOSIS — M25552 Pain in left hip: Secondary | ICD-10-CM

## 2023-12-18 DIAGNOSIS — R293 Abnormal posture: Secondary | ICD-10-CM | POA: Diagnosis present

## 2023-12-18 DIAGNOSIS — M6281 Muscle weakness (generalized): Secondary | ICD-10-CM | POA: Diagnosis present

## 2023-12-18 MED ORDER — FAMOTIDINE 20 MG PO TABS
20.0000 mg | ORAL_TABLET | Freq: Two times a day (BID) | ORAL | 1 refills | Status: DC
  Filled 2023-12-18: qty 60, 30d supply, fill #0
  Filled 2024-02-01: qty 60, 30d supply, fill #1
  Filled 2024-02-05: qty 60, 30d supply, fill #0

## 2023-12-18 MED ORDER — TERBINAFINE HCL 250 MG PO TABS
250.0000 mg | ORAL_TABLET | Freq: Every day | ORAL | 0 refills | Status: AC
  Filled 2023-12-18: qty 30, 30d supply, fill #0

## 2023-12-18 NOTE — Progress Notes (Signed)
 Psychiatric Initial Adult Assessment   Patient Identification: David [name] MRN:  [id number] Date of Evaluation:  12/18/2023 Referral Source: Primary care Chief Complaint: feeling worthless   Visit Diagnosis:    ICD-10-CM   1. Adjustment disorder with depressed mood  F43.21       History of Present Illness:  David [name] is a 63 year old Caucasian male who presents to establish care.  Patient was seen and evaluated face-to-face by this provider.  Reports he was referred by his primary care provider due to multiple psychosocial stressors.  Denied that he never been followed by therapy or psychiatry in the past. States he has dealt with grief and loss in the past and a previous divorce.  States he would feel depressed however, will keep pushing through.  David [name] stated recently he has been struggle with feelings of worthlessness, increased depression, decreased energy and anhedonia.  Reports this symptom started over the past 4 to 5 months, due to declining physical health.  David [name] reports situations that led up to feelings of despair started about 2 years ago he was hospitalized due to a major spinal fusion surgery.  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  States then the MRSA spread to lower back where he has had spinal surgery and was hospitalized for 4 months.  States since then he had to attend physical therapy and learn to rewalk,Reports shortly after spinal surgery he had open heart surgery (quadruple heart bypass).  Where he spent another 2 months hospitalized.  States then he had it trip and fall and had a total hip replacement.   Clearance reported while he was hospitalized he received an eviction and has been unemployed.  States he has been the primary breadwinner for he and his wife.   I feel like a burden.  states he was unable to provide for his wife they have been married for 30 years.  Stated that  is biggest frustration is related to financial/ income.  Reports he will not be cleared to work at least for another 8 weeks as he continues to be followed by physical therapy.  David [name] is tearful throughout this assessment as he states he has been struggling with mood swings, irritability and does not enjoy things that he used to enjoy.   States he recently was granted disability which barely covers monthly bills.   I feel like, burden to my wife.  He denied suicidal or homicidal ideations.  Denied previous inpatient admissions.  PHQ-9 17 GAD-7 12   David [name] does report a family history related to mental illness.  States his sister struggled with depression and anxiety and she was prescribed Prozac.   She reported that medication made her feel numb.  Mother: Kurt of her diagnosis however was on multiple pain medications prior to her passing  David [name] states he is married 30 years.  States his wife is supportive.  States he has a 20 year old daughter and a 51-year-old granddaughter who currently resides in [place] .   Reports she is followed by pain management where he is prescribed OxyContin  and hydrocodone  which she reports taking daily.  Adjustment disorder:  Offered to initiate antidepressant i.e. Prozac Zoloft  and/or Wellbutrin however patient declined.   Do not want to take any more medications and I have to. - Appointment made for talk therapy David [name] on 9/30 - Follow-up as needed  David [name] is alert/oriented x 4;  calm/cooperative; and mood congruent with affect.  Patient is speaking in a clear tone at moderate volume, and normal pace; with good eye contact.  His thought process is coherent and relevant; There is no indication that he is currently responding to internal/external stimuli or experiencing delusional thought content.  Patient denies suicidal/self-harm/homicidal ideation, psychosis, and paranoia.  Patient has remained calm throughout assessment and has answered questions  appropriately.    Associated Signs/Symptoms: Depression Symptoms:  depressed mood, difficulty concentrating, anxiety, (Hypo) Manic Symptoms:  Distractibility, Anxiety Symptoms:  Excessive Worry, Psychotic Symptoms:  Hallucinations: None PTSD Symptoms: NA  Past Psychiatric History:   Previous Psychotropic Medications: No   Substance Abuse History in the last 12 months:  No. Prescription druges    Consequences of Substance Abuse:    Past Medical History:  Past Medical History:  Diagnosis Date   Cardiomyopathy (HCC)    a. EF 45% in 2019.   CHF (congestive heart failure) (HCC)    Chronic anticoagulation 09/30/2017   Diabetes mellitus type 2 in nonobese [facility])    Does not have health insurance    Essential hypertension 09/26/2017   Financial difficulties    H/O noncompliance with medical treatment, presenting hazards to health    Hardware complicating wound infection (HCC) 09/25/2022   Non-insulin  treated type 2 diabetes mellitus (HCC) 09/26/2017   Persistent atrial fibrillation (HCC)    Sleep apnea suspected 09/30/2017   years ago- not anymore per patient    Past Surgical History:  Procedure Laterality Date   ABDOMINAL AORTOGRAM W/LOWER EXTREMITY N/A 09/02/2021   Procedure: ABDOMINAL AORTOGRAM W/LOWER EXTREMITY;  Surgeon: Serene Gaile ORN, MD;  [facility];  Service: Cardiovascular;  Laterality: N/A;   ABDOMINAL AORTOGRAM W/LOWER EXTREMITY N/A 12/28/2021   Procedure: ABDOMINAL AORTOGRAM W/LOWER EXTREMITY;  Surgeon: Serene Gaile ORN, MD;  [facility];  Service: Cardiovascular;  Laterality: N/A;   ABDOMINAL AORTOGRAM W/LOWER EXTREMITY N/A 07/28/2022   Procedure: ABDOMINAL AORTOGRAM W/LOWER EXTREMITY;  Surgeon: Gretta Lonni PARAS, MD;  [facility];  Service: Cardiovascular;  Laterality: N/A;   AMPUTATION Right 09/04/2021   Procedure: 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;  Surgeon: Harden Jerona GAILS, MD;  Location: [facility] OR;  Service:  Orthopedics;  Laterality: Right;   AMPUTATION Right 08/02/2022   Procedure: AMPUTATION RAY 5TH METATARSAL OF RIGHT FOOT;  Surgeon: Barton Drape, MD;  [facility];  Service: Orthopedics;  Laterality: Right;   APPENDECTOMY  1971   BONE BIOPSY Right 08/02/2022   Procedure: BONE BIOPSY OF 5TH METATARSAL RIGHT FOOT;  Surgeon: Barton Drape, MD;  [facility];  Service: Orthopedics;  Laterality: Right;   CARDIOVERSION N/A 09/28/2017   Procedure: CARDIOVERSION;  Surgeon: Francyne Headland, MD;  [facility];  Service: Cardiovascular;  Laterality: N/A;   CLIPPING OF ATRIAL APPENDAGE N/A 04/18/2023   Procedure: CLIPPING OF ATRIAL APPENDAGE USING ATRICURE ATRICLIP [id number] with pulmonary vein isolation ablation;  Surgeon: Lucas Dorise POUR, MD;  [facility];  Service: Open Heart Surgery;  Laterality: N/A;   CORONARY ARTERY BYPASS GRAFT N/A 04/18/2023   Procedure: CORONARY ARTERY BYPASS GRAFTING (CABG) TIMES FOUR USING LEFT INTERNAL MAMMARY ARTERY AND ENDOSCOPICALLY HARVESTED LEFT GREATER SAPHENOUS VEIN;  Surgeon: Lucas Dorise POUR, MD;  [facility];  Service: Open Heart Surgery;  Laterality: N/A;   IRRIGATION AND DEBRIDEMENT FOOT  08/02/2022   Procedure: IRRIGATION AND DEBRIDEMENT RIGHT 5TH METATARSAL FOOT;  Surgeon: Barton Drape, MD;  [facility];  Service: Orthopedics;;  LAMINECTOMY WITH POSTERIOR LATERAL ARTHRODESIS LEVEL 4 N/A 08/30/2022   Procedure: THORACIC FIVE-THORACIC ELEVEN FUSION, THORACIC EIGHT TRANSPEDICULAR DECOMPRESSION AND PARTIAL CORPECTOMY with O-Arm;  Surgeon: Debby Dorn MATSU, MD;  Location: [facility] OR;  Service: Neurosurgery;  Laterality: N/A;   LEFT HEART CATH AND CORONARY ANGIOGRAPHY N/A 04/12/2023   Procedure: LEFT HEART CATH AND CORONARY ANGIOGRAPHY;  Surgeon: Verlin Lonni BIRCH, MD;  [facility];  Service: Cardiovascular;  Laterality: N/A;   NASAL ENDOSCOPY Right 06/30/2023   Procedure: ENDOSCOPY, NOSE;  Surgeon: Jesus Oliphant,  MD;  Location: [facility] OR;  Service: ENT;  Laterality: Right;   PERIPHERAL VASCULAR BALLOON ANGIOPLASTY  09/02/2021   Procedure: PERIPHERAL VASCULAR BALLOON ANGIOPLASTY;  Surgeon: Serene Gaile ORN, MD;  [facility];  Service: Cardiovascular;;   PERIPHERAL VASCULAR BALLOON ANGIOPLASTY  12/28/2021   Procedure: PERIPHERAL VASCULAR BALLOON ANGIOPLASTY;  Surgeon: Serene Gaile ORN, MD;  [facility];  Service: Cardiovascular;;  Posterior Tibial PTA only   POLYPECTOMY Right 06/30/2023   Procedure: POLYPECTOMY, NASAL CAVITY;  Surgeon: Jesus Oliphant, MD;  Location: [facility] OR;  Service: ENT;  Laterality: Right;   TEE WITHOUT CARDIOVERSION N/A 09/28/2017   Procedure: TRANSESOPHAGEAL ECHOCARDIOGRAM (TEE);  Surgeon: Francyne Headland, MD;  Location: [facility] ENDOSCOPY;  Service: Cardiovascular;  Laterality: N/A;   TEE WITHOUT CARDIOVERSION N/A 09/03/2021   Procedure: TRANSESOPHAGEAL ECHOCARDIOGRAM (TEE);  Surgeon: Raford Riggs, MD;  Location: [facility] ENDOSCOPY;  Service: Cardiovascular;  Laterality: N/A;   TEE WITHOUT CARDIOVERSION N/A 07/15/2022   Procedure: TRANSESOPHAGEAL ECHOCARDIOGRAM;  Surgeon: Francyne Headland, MD;  [facility];  Service: Cardiovascular;  Laterality: N/A;   TEE WITHOUT CARDIOVERSION N/A 04/18/2023   Procedure: TRANSESOPHAGEAL ECHOCARDIOGRAM (TEE);  Surgeon: Lucas Dorise POUR, MD;  Location: [facility] OR;  Service: Open Heart Surgery;  Laterality: N/A;   TOTAL HIP ARTHROPLASTY Left 07/14/2023   Procedure: ARTHROPLASTY, HIP, TOTAL, ANTERIOR APPROACH;  Surgeon: Kendal Franky SQUIBB, MD;  [facility];  Service: Orthopedics;  Laterality: Left;    Family Psychiatric History:   Family History:  Family History  Problem Relation Age of Onset   Hypertension Mother     Social History:   Social History   Socioeconomic History   Marital status: Married    Spouse name: Not on file   Number of children: Not on file   Years of education: Not on file   Highest education  level: Some college, no degree  Occupational History   Not on file  Tobacco Use   Smoking status: Former    Current packs/day: 0.00    Types: Cigarettes    Start date: 03/19/2003    Quit date: 03/18/2018    Years since quitting: 5.7   Smokeless tobacco: Never   Tobacco comments:    09/26/2017 2-3 cigarettes/month now  Vaping Use   Vaping status: Never Used  Substance and Sexual Activity   Alcohol use: Not Currently    Alcohol/week: 3.0 standard drinks of alcohol    Types: 3 Cans of beer per week    Comment: States he quit drinking 8-9 months ago   Drug use: Never   Sexual activity: Not Currently  Other Topics Concern   Not on file  Social History Narrative   Not on file   Social Drivers of Health   Financial Resource Strain: Medium Risk (10/22/2023)   Overall Financial Resource Strain (CARDIA)    Difficulty of Paying Living Expenses: Somewhat hard  Food Insecurity: No Food Insecurity (10/31/2023)  Hunger Vital Sign    Worried About Running Out of Food in the Last Year: Never true    Ran Out of Food in the Last Year: Never true  Transportation Needs: No Transportation Needs (10/31/2023)   PRAPARE - Administrator, Civil Service (Medical): No    Lack of Transportation (Non-Medical): No  Physical Activity: Sufficiently Active (10/22/2023)   Exercise Vital Sign    Days of Exercise per Week: 3 days    Minutes of Exercise per Session: 50 min  Stress: Stress Concern Present (10/31/2023)   [employer] of Occupational Health - Occupational Stress Questionnaire    Feeling of Stress: To some extent  Social Connections: Unknown (10/22/2023)   Social Connection and Isolation Panel    Frequency of Communication with Friends and Family: Once a week    Frequency of Social Gatherings with Friends and Family: Patient declined    Attends Religious Services: Never    Database administrator or Organizations: No    Attends Engineer, structural: Not on file     Marital Status: Married    Additional Social History:   Allergies:  No Known Allergies  Metabolic Disorder Labs: Lab Results  Component Value Date   HGBA1C 7.9 (A) 10/24/2023   MPG 166 04/09/2023   MPG 111.15 10/08/2022   No results found for: PROLACTIN Lab Results  Component Value Date   CHOL 199 01/09/2023   TRIG 235 (H) 01/09/2023   HDL 36 (L) 01/09/2023   CHOLHDL 5.9 (H) 12/14/2021   VLDL 50 (H) 09/03/2021   LDLCALC 121 (H) 01/09/2023   LDLCALC 142 (H) 12/14/2021   Lab Results  Component Value Date   TSH 2.335 07/16/2023    Therapeutic Level Labs: No results found for: LITHIUM No results found for: CBMZ No results found for: VALPROATE  Current Medications: Current Outpatient Medications  Medication Sig Dispense Refill   Accu-Chek Softclix Lancets lancets Use to check blood sugar 3 times daily. 100 each 6   ALPRAZolam  (XANAX ) 0.5 MG tablet Take 1 tablet (0.5 mg total) by mouth at bedtime as needed for anxiety or sleep. (Patient not taking: Reported on 10/31/2023) 10 tablet 0   amiodarone  (PACERONE ) 200 MG tablet Take 1 tablet (200 mg total) by mouth daily for 10 days.     apixaban  (ELIQUIS ) 5 MG TABS tablet Take 1 tablet (5 mg total) by mouth 2 (two) times daily. 180 tablet 1   carvedilol  (COREG ) 6.25 MG tablet Take 1 tablet (6.25 mg total) by mouth 2 (two) times daily. 180 tablet 3   cephALEXin  (KEFLEX ) 500 MG capsule Take 1 capsule (500 mg total) by mouth 4 (four) times daily. 40 capsule 0   Continuous Glucose Sensor (DEXCOM G7 SENSOR) MISC Use as directed & change every 10 days. 3 each 12   dapagliflozin  propanediol (FARXIGA ) 10 MG TABS tablet Take 1 tablet (10 mg total) by mouth daily before breakfast. 90 tablet 1   docusate sodium  (COLACE) 100 MG capsule Take 2 capsules (200 mg total) by mouth 2 (two) times daily. (Patient not taking: Reported on 10/31/2023)     doxycycline  (VIBRA -TABS) 100 MG tablet Take 1 tablet (100 mg total) by mouth every 12 (twelve)  hours. 180 tablet 3   Evolocumab  (REPATHA  SURECLICK) 140 MG/ML SOAJ Inject 140 mg into the skin every 14 (fourteen) days. 6 mL 3   famotidine  (PEPCID ) 20 MG tablet Take 1 tablet (20 mg total) by mouth 2 times daily. 60 tablet 1  furosemide  (LASIX ) 40 MG tablet Take 1 tablet (40 mg total) by mouth 2 (two) times daily. 180 tablet 1   glucose blood test strip Use to check blood sugar 3 times daily. 100 each 6   HYDROcodone -acetaminophen  (NORCO) 10-325 MG tablet Take 1 tablet by mouth every 4 (four) hours as needed for moderate pain (pain score 4-6). 30 tablet 0   icosapent  Ethyl (VASCEPA ) 1 g capsule Take 2 capsules (2 g total) by mouth 2 (two) times daily. 120 capsule 11   insulin  aspart (NOVOLOG ) 100 UNIT/ML injection Inject 0-15 Units into the skin 3 (three) times daily with meals. insulin  aspart (novoLOG ) injection 0-15 Units  0-15 Units, Subcutaneous, 3 times daily with meals, First dose on Tue 07/18/23 at 1200 Correction coverage: Moderate (average weight, post-op) CBG < 70: Implement Hypoglycemia Standing Orders and refer to Hypoglycemia Standing Orders sidebar report CBG 70 - 120: 0 units CBG 121 - 150: 2 units CBG 151 - 200: 3 units CBG 201 - 250: 5 units CBG 251 - 300: 8 units CBG 301 - 350: 11 units CBG 351 - 400: 15 units     insulin  glargine (LANTUS  SOLOSTAR) 100 UNIT/ML Solostar Pen Inject 27 Units into the skin daily. 30 mL 6   Insulin  Pen Needle (PEN NEEDLES) 32G X 4 MM MISC Use to inject insulin  once daily. 100 each 6   methocarbamol  (ROBAXIN ) 750 MG tablet Take 1 tablet (750 mg total) by mouth every 8 (eight) hours as needed for muscle spasms. (Patient not taking: Reported on 10/31/2023) 270 tablet 1   naloxone  (NARCAN ) nasal spray 4 mg/0.1 mL Opiates overdose (Patient taking differently: Place 1 spray into the nose once. Opiates overdose) 2 each 0   OXYCONTIN  10 MG 12 hr tablet Take 1 tablet (10 mg total) by mouth every 12 (twelve) hours. 14 tablet 0   polyethylene glycol (MIRALAX  /  GLYCOLAX ) 17 g packet Mix 1 packet (17 g) in beverage and take by mouth 2 (two) times daily. (Patient taking differently: Take 17 g by mouth every other day.) 100 each 3   Potassium Chloride  ER 20 MEQ TBCR Take 1 tablet (20 mEq total) by mouth daily. 60 tablet 1   pregabalin  (LYRICA ) 75 MG capsule Take 1 capsule (75 mg total) by mouth 2 (two) times daily. 60 capsule 2   rosuvastatin  (CRESTOR ) 20 MG tablet Take 1 tablet (20 mg total) by mouth daily. 90 tablet 3   tamsulosin  (FLOMAX ) 0.4 MG CAPS capsule Take 1 capsule (0.4 mg total) by mouth daily after supper. 90 capsule 1   terbinafine  (LAMISIL ) 250 MG tablet Take 1 tablet (250 mg total) by mouth daily. 30 tablet 2   Vitamin D , Ergocalciferol , (DRISDOL ) 1.25 MG (50000 UNIT) CAPS capsule Take 1 capsule (50,000 Units total) by mouth every 7 (seven) days. (Patient not taking: Reported on 10/31/2023) 8 capsule 0   No current facility-administered medications for this visit.    Musculoskeletal: Strength & Muscle Tone: decreased Gait & Station: unsteady Patient leans: Right  Psychiatric Specialty Exam: Review of Systems  Blood pressure 132/83, pulse 91, height 6' (1.829 m), weight 190 lb (86.2 kg).Body mass index is 25.77 kg/m.  General Appearance: Casual  Eye Contact:  Good  Speech:  Clear and Coherent  Volume:  Normal  Mood:  Anxious and Depressed  Affect:  Congruent  Thought Process:  Coherent  Orientation:  Full (Time, Place, and Person)  Thought Content:  Logical  Suicidal Thoughts:  No  Homicidal Thoughts:  No  Memory:  Immediate;   Good Recent;   Good  Judgement:  Good  Insight:  Fair  Psychomotor Activity:  Normal  Concentration:  Concentration: Good  Recall:  Good  Fund of Knowledge:Good  Language: Good  Akathisia:  No  Handed:  Right  AIMS (if indicated):  not done  Assets:  Communication Skills Desire for Improvement  ADL's:  Intact  Cognition: WNL  Sleep:  Fair   Screenings: CAGE-AID    Flowsheet Row ED to  Hosp-Admission (Discharged) from 07/12/2023 in [place] [facility]U  CAGE-AID Score 0   GAD-7    Flowsheet Row Patient Outreach Telephone from 10/31/2023 in [place] HEALTH POPULATION HEALTH DEPARTMENT Office Visit from 06/22/2023 in [facility] [place] - A Dept Of [place]. [facility] Office Visit from 12/29/2022 in [facility] - A Dept Of [place] [facility]. [facility] Office Visit from 01/08/2021 in [facility] [place] - A Dept Of [place]. [facility] Office Visit from 11/02/2018 in [facility] [place] - A Dept Of [place] [facility]. [facility]  Total GAD-7 Score 12 9 7 11  0   PHQ2-9    Flowsheet Row Office Visit from 12/18/2023 in [facility] Patient Outreach Telephone from 10/31/2023 in [facility] HEALTH POPULATION HEALTH DEPARTMENT Office Visit from 06/22/2023 in [facility] [place] - A Dept Of [place]. [facility] Office Visit from 12/29/2022 in [facility] - A Dept Of [place] [facility]. [facility] Office Visit from 12/12/2022 in [facility] - A Dept Of [place]. [facility]  PHQ-2 Total Score 6 2 3 3  0  PHQ-9 Total Score 17 8 8 12  --   Flowsheet Row ED from 11/09/2023 in [facility] Emergency Department at [facility] ED from 10/26/2023 in [facility] Emergency Department at [facility] ED to Hosp-Admission (Discharged) from 07/12/2023 in [place] [facility]W Medical Specialty PCU  C-SSRS RISK CATEGORY No Risk No Risk No Risk    Assessment and Plan: David [name] 63 year old male presents to establish care.  Reports struggling with depression symptoms.  Reports his depression stems from chronic medical issues.  Reported hip replacement, quadruple heart bypass, major spinal fusion surgery and toe amputation.  Reports concerns related to financial stressors that his declining health has caused on his  family.  He denied suicidal or homicidal ideations.  Denied auditory visual hallucinations.  Denied previous inpatient admission.  Denied illicit drug use or substance abuse history.  Does report taking prescription chronic pain medication.  Declined in initiating any psychotropic medication at this time.  Is interested in talk therapy.  Patient to follow-up as needed.  Collaboration of Care: Medication Management AEB declined initiating any psychotropic medications at this visit  Patient/Guardian was advised Release of Information must be obtained prior to any record release in order to collaborate their care with an outside provider. Patient/Guardian was advised if they have not already done so to contact the registration department to sign all necessary forms in order for us  to release information regarding their care.   Consent: Patient/Guardian gives verbal consent for treatment and assignment of benefits for services provided during this visit. Patient/Guardian expressed understanding and agreed to proceed.   Staci LOISE Kerns, NP 9/15/20252:58 PM

## 2023-12-18 NOTE — Therapy (Signed)
 OUTPATIENT PHYSICAL THERAPY TREATMENT NOTE   Patient Name: David [name] MRN: [id number] DOB:October 12, 1960, 63 y.o., male Today's Date: 12/18/2023  END OF SESSION:  PT End of Session - 12/18/23 1235     Visit Number 16    Number of Visits 27    Date for PT Re-Evaluation 01/18/24    Authorization Type Medicaid no auth 27 visits    PT Start Time 1145    PT Stop Time 1227    PT Time Calculation (min) 42 min    Activity Tolerance Patient tolerated treatment well    Behavior During Therapy WFL for tasks assessed/performed                      Past Medical History:  Diagnosis Date   Cardiomyopathy (HCC)    a. EF 45% in 2019.   CHF (congestive heart failure) (HCC)    Chronic anticoagulation 09/30/2017   Diabetes mellitus type 2 in nonobese [facility])    Does not have health insurance    Essential hypertension 09/26/2017   Financial difficulties    H/O noncompliance with medical treatment, presenting hazards to health    Hardware complicating wound infection (HCC) 09/25/2022   Non-insulin  treated type 2 diabetes mellitus (HCC) 09/26/2017   Persistent atrial fibrillation (HCC)    Sleep apnea suspected 09/30/2017   years ago- not anymore per patient   Past Surgical History:  Procedure Laterality Date   ABDOMINAL AORTOGRAM W/LOWER EXTREMITY N/A 09/02/2021   Procedure: ABDOMINAL AORTOGRAM W/LOWER EXTREMITY;  Surgeon: Serene Gaile ORN, MD;  [facility]: [facility];  Service: Cardiovascular;  Laterality: N/A;   ABDOMINAL AORTOGRAM W/LOWER EXTREMITY N/A 12/28/2021   Procedure: ABDOMINAL AORTOGRAM W/LOWER EXTREMITY;  Surgeon: Serene Gaile ORN, MD;  [facility]: [facility];  Service: Cardiovascular;  Laterality: N/A;   ABDOMINAL AORTOGRAM W/LOWER EXTREMITY N/A 07/28/2022   Procedure: ABDOMINAL AORTOGRAM W/LOWER EXTREMITY;  Surgeon: Gretta Lonni PARAS, MD;  [facility]: [facility];  Service: Cardiovascular;  Laterality: N/A;   AMPUTATION Right 09/04/2021    Procedure: 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;  Surgeon: Harden Jerona GAILS, MD;  Location: [facility] OR;  Service: Orthopedics;  Laterality: Right;   AMPUTATION Right 08/02/2022   Procedure: AMPUTATION RAY 5TH METATARSAL OF RIGHT FOOT;  Surgeon: Barton Drape, MD;  Location: [facility] OR;  Service: Orthopedics;  Laterality: Right;   APPENDECTOMY  1971   BONE BIOPSY Right 08/02/2022   Procedure: BONE BIOPSY OF 5TH METATARSAL RIGHT FOOT;  Surgeon: Barton Drape, MD;  [facility];  Service: Orthopedics;  Laterality: Right;   CARDIOVERSION N/A 09/28/2017   Procedure: CARDIOVERSION;  Surgeon: Francyne Headland, MD;  [facility];  Service: Cardiovascular;  Laterality: N/A;   CLIPPING OF ATRIAL APPENDAGE N/A 04/18/2023   Procedure: CLIPPING OF ATRIAL APPENDAGE USING ATRICURE ATRICLIP [id number] with pulmonary vein isolation ablation;  Surgeon: Lucas Dorise POUR, MD;  Location: [facility] OR;  Service: Open Heart Surgery;  Laterality: N/A;   CORONARY ARTERY BYPASS GRAFT N/A 04/18/2023   Procedure: CORONARY ARTERY BYPASS GRAFTING (CABG) TIMES FOUR USING LEFT INTERNAL MAMMARY ARTERY AND ENDOSCOPICALLY HARVESTED LEFT GREATER SAPHENOUS VEIN;  Surgeon: Lucas Dorise POUR, MD;  [facility];  Service: Open Heart Surgery;  Laterality: N/A;   IRRIGATION AND DEBRIDEMENT FOOT  08/02/2022   Procedure: IRRIGATION AND DEBRIDEMENT RIGHT 5TH METATARSAL FOOT;  Surgeon: Barton Drape, MD;  [facility];  Service: Orthopedics;;   LAMINECTOMY WITH POSTERIOR LATERAL ARTHRODESIS LEVEL 4 N/A 08/30/2022   Procedure:  THORACIC FIVE-THORACIC ELEVEN FUSION, THORACIC EIGHT TRANSPEDICULAR DECOMPRESSION AND PARTIAL CORPECTOMY with O-Arm;  Surgeon: Debby Dorn MATSU, MD;  Location: [facility] OR;  Service: Neurosurgery;  Laterality: N/A;   LEFT HEART CATH AND CORONARY ANGIOGRAPHY N/A 04/12/2023   Procedure: LEFT HEART CATH AND CORONARY ANGIOGRAPHY;  Surgeon: Verlin Lonni BIRCH, MD;  [facility];  Service: Cardiovascular;  Laterality:  N/A;   NASAL ENDOSCOPY Right 06/30/2023   Procedure: ENDOSCOPY, NOSE;  Surgeon: Jesus Oliphant, MD;  Location: [facility] OR;  Service: ENT;  Laterality: Right;   PERIPHERAL VASCULAR BALLOON ANGIOPLASTY  09/02/2021   Procedure: PERIPHERAL VASCULAR BALLOON ANGIOPLASTY;  Surgeon: Serene Gaile ORN, MD;  [facility];  Service: Cardiovascular;;   PERIPHERAL VASCULAR BALLOON ANGIOPLASTY  12/28/2021   Procedure: PERIPHERAL VASCULAR BALLOON ANGIOPLASTY;  Surgeon: Serene Gaile ORN, MD;  [facility];  Service: Cardiovascular;;  Posterior Tibial PTA only   POLYPECTOMY Right 06/30/2023   Procedure: POLYPECTOMY, NASAL CAVITY;  Surgeon: Jesus Oliphant, MD;  Location: [facility] OR;  Service: ENT;  Laterality: Right;   TEE WITHOUT CARDIOVERSION N/A 09/28/2017   Procedure: TRANSESOPHAGEAL ECHOCARDIOGRAM (TEE);  Surgeon: Francyne Headland, MD;  Location: [facility] ENDOSCOPY;  Service: Cardiovascular;  Laterality: N/A;   TEE WITHOUT CARDIOVERSION N/A 09/03/2021   Procedure: TRANSESOPHAGEAL ECHOCARDIOGRAM (TEE);  Surgeon: Raford Riggs, MD;  Location: [facility] ENDOSCOPY;  Service: Cardiovascular;  Laterality: N/A;   TEE WITHOUT CARDIOVERSION N/A 07/15/2022   Procedure: TRANSESOPHAGEAL ECHOCARDIOGRAM;  Surgeon: Francyne Headland, MD;  [facility];  Service: Cardiovascular;  Laterality: N/A;   TEE WITHOUT CARDIOVERSION N/A 04/18/2023   Procedure: TRANSESOPHAGEAL ECHOCARDIOGRAM (TEE);  Surgeon: Lucas Dorise POUR, MD;  Location: [facility] OR;  Service: Open Heart Surgery;  Laterality: N/A;   TOTAL HIP ARTHROPLASTY Left 07/14/2023   Procedure: ARTHROPLASTY, HIP, TOTAL, ANTERIOR APPROACH;  Surgeon: Kendal Franky SQUIBB, MD;  Location: [facility];  Service: Orthopedics;  Laterality: Left;   Patient Active Problem List   Diagnosis Date Noted   Left displaced femoral neck fracture (HCC) 07/13/2023   Closed left femoral fracture (HCC) 07/12/2023   History of CHF (congestive heart failure) 07/12/2023   Chronic kidney disease  (CKD), stage III (moderate) (HCC) 07/12/2023   Hyperlipidemia 07/12/2023   BPH (benign prostatic hyperplasia) 07/12/2023   Peripheral neuropathy 07/12/2023   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   Bradycardia 04/14/2023   NSTEMI (non-ST elevated myocardial infarction) (HCC) 04/13/2023   Chronic bilateral low back pain 04/13/2023   History of CABG in  04/2023 04/12/2023   Acute decompensated heart failure (HCC) 04/10/2023   Chronic pain syndrome 04/09/2023   MRSA infection greater than 3 months ago 04/09/2023   Hypoglycemia 10/06/2022   Pleural effusion 09/29/2022   Chronic systolic CHF (congestive heart failure) (HCC) 09/29/2022   Acute urinary retention 09/29/2022   Anxiety 09/29/2022   Hardware complicating wound infection (HCC) 09/25/2022   Discitis of thoracic region 09/25/2022   Hypokalemia 09/07/2022   Acute toxic encephalopathy 09/07/2022   Acute postoperative anemia due to expected blood loss 09/07/2022   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   Adjustment disorder with mixed anxiety and depressed mood 08/13/2022   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 08/08/2022   MRSA bacteremia 08/08/2022   AKI (acute kidney injury), ruled out 07/27/2022   Discitis thoracic region 07/27/2022   Bacteremia due to methicillin resistant Staphylococcus aureus 07/27/2022   Severe protein-calorie malnutrition (HCC) 07/26/2022   PVD (peripheral vascular disease) (HCC) 07/26/2022   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   Bacteremia  07/15/2022   Atelectasis 07/12/2022   Sepsis due to pneumonia (HCC) 07/12/2022   Acute on chronic diastolic (congestive) heart failure (HCC) 07/12/2022   Gangrene of toe of right foot (HCC)    Sepsis (HCC) 08/29/2021   SIRS (systemic inflammatory response syndrome) (HCC) 08/29/2021   Hyponatremia 08/29/2021   Hypomagnesemia 08/29/2021   Pure hypercholesterolemia 06/17/2021   Pain of left hand 09/06/2019   Cellulitis 08/26/2019   Persistent atrial fibrillation (HCC) 08/26/2019    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) 10/18/2018   Transaminitis 10/17/2018   Hyperbilirubinemia 10/17/2018   Leukocytosis 10/17/2018   Sinusitis 10/17/2018   Chronic anticoagulation 09/30/2017   Smoker 09/30/2017   Cardiomyopathy (HCC) 09/30/2017   Sleep apnea suspected 09/30/2017   Essential hypertension 09/26/2017   Insulin  dependent type 2 diabetes mellitus (HCC) 09/26/2017   Permanent atrial fibrillation (HCC) 09/26/2017    PCP: Delbert Clam, MD  REFERRING PROVIDER: Kendal Franky SQUIBB, MD  REFERRING DIAG: [phone] (ICD-10-CM) - Status post total hip replacement, left  THERAPY DIAG:  Stiffness of left hip, not elsewhere classified  Muscle weakness (generalized)  Difficulty in walking, not elsewhere classified  Cramp and spasm  Pain in left hip  Unsteadiness on feet  Rationale for Evaluation and Treatment: Rehabilitation  ONSET DATE: 09/26/2023  SUBJECTIVE:   SUBJECTIVE STATEMENT:    EVAL: Patient had series of orthopedic issues. He was in PT for back pain when he was found to have MRSA infection in spine May 2024, surgery followed for fusion/reconstruction, January had CABG, was still on cane once he got home and fell in bathroom and suffered hip fracture on left with subsequent THA. His THA was on 07/14/23. He was in rehab for several weeks and is now home. He felt he did not get aggressive enough PT in rehab. He then had home health and only did this 1 time per week. He did his exercises and is very motivated to get his function back. He is unsure if he will return to work. He is concerned about the physicality of the job with all that he has endured. He states this all depends on how much function he gets back. His goal is to be able to walk without the cane and build some upper body strength to be able to function at the level he did before and do his job.   PERTINENT HISTORY: Left LE neural impairment since back surgery; right toe  amputations See above for extensive medical history  PAIN: 11/28/23 Are you having pain? Yes: NPRS scale: 4/10 Pain location: right hip and left low back Pain description: aching / stiff Aggravating factors: standing > 20 min Relieving factors: moving  PRECAUTIONS: Fall  RED FLAGS: None   WEIGHT BEARING RESTRICTIONS: No  FALLS:  Has patient fallen in last 6 months? Yes. Number of falls 1  LIVING ENVIRONMENT: Lives with: lives with their spouse Lives in: House/apartment Stairs: No Has following equipment at home: Single point cane  OCCUPATION: chef  PLOF: Independent, Independent with basic ADLs, Independent with household mobility without device, Independent with community mobility without device, Independent with homemaking with ambulation, Independent with gait, and Independent with transfers  PATIENT GOALS: To be able to walk without the cane and build some upper body strength to be able to function at the level he did before and do his job.    NEXT MD VISIT: 2 months  OBJECTIVE:  Note: Objective measures were completed at Evaluation unless otherwise noted.  DIAGNOSTIC FINDINGS: na  PATIENT SURVEYS:  Eval:  LEFS : 30/80= 50% 11/08/2023:  Lower Extremity Functional Score: 26 / 80 = 32.5 % 11/28/2023:  Lower Extremity Functional Score: 38 / 80 = 47.5 %  COGNITION: Overall cognitive status: Within functional limits for tasks assessed     SENSATION: WFL   MUSCLE LENGTH: Hips tight bilaterally in hip flexors, quads and rotation both IR and ER   Left > Right  POSTURE: rounded shoulders, forward head, decreased lumbar lordosis, posterior pelvic tilt, and flexed trunk    LOWER EXTREMITY ROM:  WFL  LOWER EXTREMITY MMT:  MMT Right eval Left eval Left 11/28/23  Hip flexion  4 4+  Hip extension  4- 4  Hip abduction  4 4+  Hip adduction  4 4+  Hip internal rotation  4 4  Hip external rotation  4 4+  Knee flexion  4 4+  Knee extension  4+ 4+  Ankle  dorsiflexion     Ankle plantarflexion     Ankle inversion     Ankle eversion      (Blank rows = not tested)   FUNCTIONAL TESTS:  Eval: 5 times sit to stand: 15.14 sec (no UE use) Timed up and go (TUG): 14.00 sec  11/28/23: 5 times sit to stand: 11.67 sec (no UE use) Timed up and go (TUG): 9.34 sec  GAIT: Distance walked: 50 feet Assistive device utilized: Single point cane Level of assistance: SBA Comments: antalgic/guarded/ slow                                                                                                                                TREATMENT DATE:  NuStep L6x6' PT present to discuss current status Seated red loop clam 2x10 Sit to stand from mat table + pad x10 Supine hooklying bridge 2x10 Supine hooklying bil shoulder ext green band from overhead anchor by PT x12 Supine hooklying bil shoulder horiz abd green band x 12 SL clam no resistance - very weak on Lt hip - PT provided proximal joint approximation at hip 2x10 Open book x10 each side Lateral band walks with yellow loop x 5 laps at back counter Standing alternating tap up on cone x 20 with bil UE support Chair sit up 5lb x10, then russian twists 5lb 3x10 Resisted backward walking with belt 10lb x4 reps, close supervision for safety   11/28/2023: Recert assessment completed Nustep level 6 x 6 min with PT present to discuss status Lateral band walks with yellow loop x 5 laps at back counter Seated clamshell with red loop x 20 Standing hip ER/abduction with yellow loop 2 x 10 Hook lying clam with yellow loop x 20 Side lying clam with no resistance 2 x 10 each LE Hook lying bridge 2 x 10 Standing alternating tap up on cone x 20 Star drill using slider x 5 each side (5 points)  8/22/2:Pt arrives for aquatic physical therapy. Treatment took place in 3.5-5.5  feet of water. Water temperature was 91 degrees F. Pt entered the pool via stairs independently. Seated water bench with 75% submersion Pt  performed seated LE AROM exercises 20x in all planes, VC for deep breathing to help relax prior to more strenous exercises. 75% water depth: water walking with running arms 10x in each direction. VC to slow pace walking backwards for better control.  Hip kicks 20x Bil, requires holding on for balance. Sit to stand from 3rd step 3x5. Lateral step ups 2x5 Bil. Handrails area must for balance for each of those exercises. Seated decompression float 2 min for back pain, then 2 min of underwater bicycle, ending with 2 min of decompression.  11/17/23:Pt arrives for aquatic physical therapy. Treatment took place in 3.5-5.5 feet of water. Water temperature was 91 degrees F. Pt entered the pool via stairs independently. Seated water bench with 75% submersion Pt performed seated LE AROM exercises 20x in all planes, VC for deep breathing to help relax prior to more strenous exercises. 75% water depth: water walking with running arms 10x in each direction. VC to slow pace walking backwards for better control.  Hip kicks 20x Bil, requires holding on for balance. Sit to stand from 3rd step 3x5. Lateral step ups 2x5 Bil. Handrails area must for balance for each of those exercises. Seated decompression float 2 min for back pain, then 2 min of underwater bicycle, ending with 2 min of decompression.  ASSESSMENT:   CLINICAL IMPRESSION:  Harland has made significant progress.  He continues to be compliant with HEP between visits.  He demo'd safe ambulation within the clinic without his cane today.  He is challenged with SL clam without resistance due to proximal hip weakness on Lt.  He was able to advance some core stabilization today with good tolerance.  Eccentric control of Lt knee is limiting ability to descend stairs with reciprocal pattern but he is now ascending with step over step pattern.  PATIENT EDUCATION:  Education details: Initiated HEP Person educated: Patient Education method: Programmer, multimedia, Facilities manager,  Verbal cues, and Handouts Education comprehension: verbalized understanding and returned demonstration  HOME EXERCISE PROGRAM: Access Code: [email or web address] URL: [email or web address] Date: 11/08/2023 Prepared by: Jarrell Menke  Exercises - Seated Hip Abduction with Resistance  - 1 x daily - 7 x weekly - 1 sets - 20 reps - Seated March with Resistance  - 1 x daily - 7 x weekly - 2 sets - 8 reps - Squat with Chair Touch  - 1 x daily - 7 x weekly - 4 sets - 5 reps - Side Stepping with Resistance at Ankles  - 1 x daily - 7 x weekly - 3 sets - 10 reps - Forward Step Touch  - 2-3 x daily - 7 x weekly - 1 sets - 5-8 reps - Standing 3-way Hip with Walker  - 2-3 x daily - 7 x weekly - 1 sets - 5-8 reps    OBJECTIVE IMPAIRMENTS: Abnormal gait, cardiopulmonary status limiting activity, decreased activity tolerance, decreased balance, decreased endurance, difficulty walking, decreased ROM, decreased strength, increased fascial restrictions, increased muscle spasms, impaired flexibility, impaired UE functional use, improper body mechanics, postural dysfunction, and pain.   ACTIVITY LIMITATIONS: carrying, lifting, bending, sitting, standing, squatting, sleeping, stairs, transfers, bed mobility, bathing, toileting, dressing, reach over head, hygiene/grooming, and caring for others  PARTICIPATION LIMITATIONS: meal prep, cleaning, laundry, driving, shopping, community activity, occupation, yard work, and church  PERSONAL FACTORS: Fitness, Past/current experiences, Profession, Time since onset of injury/illness/exacerbation, and  3+ comorbidities: spine surgery, CABG, diabetes and Htn are also affecting patient's functional outcome.   REHAB POTENTIAL: Good  CLINICAL DECISION MAKING: Unstable/unpredictable  EVALUATION COMPLEXITY: High   GOALS: Goals reviewed with patient? Yes  SHORT TERM GOALS: Target date: 10/26/2023  Pain report to be no greater than 4/10  Baseline: Goal status: Met on  11/01/23  2.  Patient will be independent with initial HEP  Baseline:  Goal status: Met on 10/18/23  3.  Patient to be able to ambulate 50 feet without a.d Baseline:  Goal status: Met on 11/01/2023  4.  Patient to be able to do 10 sit to stand without UE support  Baseline:  Goal status: Met on 11/08/23    LONG TERM GOALS: Target date: 11/23/2023  Patient to report pain no greater than 2/10  Baseline:  Goal status: Progressing   2.  Patient to be independent with advanced HEP  Baseline:  Goal status: ongoing   3.  Patient to be able to stand or walk for at least 15 min without pain or need to rest Baseline:  Goal status: INITIAL  4.  Patient to be able to ascend and descend 12 steps safely Baseline:  Goal status: Ongoing   5.  Patient to report 85% improvement in overall symptoms and function Baseline:  Goal status: Ongoing  6.  Patient to be able to walk 500 feet without a.d. safely without rest breaks Baseline:  Goal status: Ongoing    PLAN:  PT FREQUENCY: 3x/week  PT DURATION: 8 weeks  PLANNED INTERVENTIONS: 97110-Therapeutic exercises, 97530- Therapeutic activity, 97112- Neuromuscular re-education, 97535- Self Care, [id number]- Manual therapy, [phone]- Gait training, [phone]- Aquatic Therapy, [phone]- Electrical stimulation (unattended), [phone]- Electrical stimulation (manual), [id number]- Vasopneumatic device, 97035- Ultrasound, [id number]- Ionotophoresis 4mg /ml Dexamethasone , Patient/Family education, Balance training, Stair training, Taping, Joint mobilization, Spinal mobilization, Cryotherapy, and Moist heat  PLAN FOR NEXT SESSION: Continue higher level functional training; emphasis on hip stability,  balance exercises   Orvil Fester, PT 12/18/23 12:36 PM  [facility] [address] [place], [place] [address] Phone # [phone] Fax [phone]

## 2023-12-19 NOTE — Addendum Note (Signed)
 Addended by: HARVEY NEST B on: 12/19/2023 05:25 PM   Modules accepted: Orders

## 2023-12-21 ENCOUNTER — Other Ambulatory Visit (HOSPITAL_COMMUNITY): Payer: Self-pay

## 2023-12-21 MED ORDER — IPRATROPIUM BROMIDE 0.03 % NA SOLN
2.0000 | Freq: Two times a day (BID) | NASAL | 5 refills | Status: AC
  Filled 2023-12-21: qty 30, 43d supply, fill #0
  Filled 2024-02-01 – 2024-04-27 (×2): qty 30, 43d supply, fill #1

## 2023-12-22 ENCOUNTER — Ambulatory Visit: Admitting: Physical Therapy

## 2023-12-22 NOTE — Therapy (Deleted)
 OUTPATIENT PHYSICAL THERAPY TREATMENT NOTE   Patient Name: David [name] MRN: [id number] DOB:[birth year], 63 y.o., male Today's Date: 12/22/2023  END OF SESSION:                Past Medical History:  Diagnosis Date   Cardiomyopathy (HCC)    a. EF 45% in 2019.   CHF (congestive heart failure) (HCC)    Chronic anticoagulation 09/30/2017   Diabetes mellitus type 2 in nonobese [facility])    Does not have health insurance    Essential hypertension 09/26/2017   Financial difficulties    H/O noncompliance with medical treatment, presenting hazards to health    Hardware complicating wound infection (HCC) 09/25/2022   Non-insulin  treated type 2 diabetes mellitus (HCC) 09/26/2017   Persistent atrial fibrillation (HCC)    Sleep apnea suspected 09/30/2017   years ago- not anymore per patient   Past Surgical History:  Procedure Laterality Date   ABDOMINAL AORTOGRAM W/LOWER EXTREMITY N/A 09/02/2021   Procedure: ABDOMINAL AORTOGRAM W/LOWER EXTREMITY;  Surgeon: Serene Gaile ORN, MD;  [facility];  Service: Cardiovascular;  Laterality: N/A;   ABDOMINAL AORTOGRAM W/LOWER EXTREMITY N/A 12/28/2021   Procedure: ABDOMINAL AORTOGRAM W/LOWER EXTREMITY;  Surgeon: Serene Gaile ORN, MD;  [facility]: [facility];  Service: Cardiovascular;  Laterality: N/A;   ABDOMINAL AORTOGRAM W/LOWER EXTREMITY N/A 07/28/2022   Procedure: ABDOMINAL AORTOGRAM W/LOWER EXTREMITY;  Surgeon: Gretta Lonni PARAS, MD;  [facility]: [facility];  Service: Cardiovascular;  Laterality: N/A;   AMPUTATION Right 09/04/2021   Procedure: 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;  Surgeon: Harden Jerona GAILS, MD;  Location: [facility] OR;  Service: Orthopedics;  Laterality: Right;   AMPUTATION Right 08/02/2022   Procedure: AMPUTATION RAY 5TH METATARSAL OF RIGHT FOOT;  Surgeon: Barton Drape, MD;  Location: [facility];  Service: Orthopedics;  Laterality: Right;   APPENDECTOMY  1971   BONE BIOPSY Right 08/02/2022   Procedure:  BONE BIOPSY OF 5TH METATARSAL RIGHT FOOT;  Surgeon: Barton Drape, MD;  [facility];  Service: Orthopedics;  Laterality: Right;   CARDIOVERSION N/A 09/28/2017   Procedure: CARDIOVERSION;  Surgeon: Francyne Headland, MD;  [facility];  Service: Cardiovascular;  Laterality: N/A;   CLIPPING OF ATRIAL APPENDAGE N/A 04/18/2023   Procedure: CLIPPING OF ATRIAL APPENDAGE USING ATRICURE ATRICLIP [id number] with pulmonary vein isolation ablation;  Surgeon: Lucas Dorise POUR, MD;  Location[facility];  Service: Open Heart Surgery;  Laterality: N/A;   CORONARY ARTERY BYPASS GRAFT N/A 04/18/2023   Procedure: CORONARY ARTERY BYPASS GRAFTING (CABG) TIMES FOUR USING LEFT INTERNAL MAMMARY ARTERY AND ENDOSCOPICALLY HARVESTED LEFT GREATER SAPHENOUS VEIN;  Surgeon: Lucas Dorise POUR, MD;  [facility];  Service: Open Heart Surgery;  Laterality: N/A;   IRRIGATION AND DEBRIDEMENT FOOT  08/02/2022   Procedure: IRRIGATION AND DEBRIDEMENT RIGHT 5TH METATARSAL FOOT;  Surgeon: Barton Drape, MD;  [facility];  Service: Orthopedics;;   LAMINECTOMY WITH POSTERIOR LATERAL ARTHRODESIS LEVEL 4 N/A 08/30/2022   Procedure: THORACIC FIVE-THORACIC ELEVEN FUSION, THORACIC EIGHT TRANSPEDICULAR DECOMPRESSION AND PARTIAL CORPECTOMY with O-Arm;  Surgeon: Debby Dorn MATSU, MD;  Location: [facility] OR;  Service: Neurosurgery;  Laterality: N/A;   LEFT HEART CATH AND CORONARY ANGIOGRAPHY N/A 04/12/2023   Procedure: LEFT HEART CATH AND CORONARY ANGIOGRAPHY;  Surgeon: Verlin Lonni BIRCH, MD;  [facility];  Service: Cardiovascular;  Laterality: N/A;   NASAL ENDOSCOPY Right 06/30/2023   Procedure: ENDOSCOPY, NOSE;  Surgeon: Jesus Oliphant, MD;  Location: [facility] OR;  Service:  ENT;  Laterality: Right;   PERIPHERAL VASCULAR BALLOON ANGIOPLASTY  09/02/2021   Procedure: PERIPHERAL VASCULAR BALLOON ANGIOPLASTY;  Surgeon: Serene Gaile ORN, MD;  [facility];  Service: Cardiovascular;;   PERIPHERAL VASCULAR BALLOON  ANGIOPLASTY  12/28/2021   Procedure: PERIPHERAL VASCULAR BALLOON ANGIOPLASTY;  Surgeon: Serene Gaile ORN, MD;  [facility];  Service: Cardiovascular;;  Posterior Tibial PTA only   POLYPECTOMY Right 06/30/2023   Procedure: POLYPECTOMY, NASAL CAVITY;  Surgeon: Jesus Oliphant, MD;  Location: [facility] OR;  Service: ENT;  Laterality: Right;   TEE WITHOUT CARDIOVERSION N/A 09/28/2017   Procedure: TRANSESOPHAGEAL ECHOCARDIOGRAM (TEE);  Surgeon: Francyne Headland, MD;  Location: [facility] ENDOSCOPY;  Service: Cardiovascular;  Laterality: N/A;   TEE WITHOUT CARDIOVERSION N/A 09/03/2021   Procedure: TRANSESOPHAGEAL ECHOCARDIOGRAM (TEE);  Surgeon: Raford Riggs, MD;  Location: [facility] ENDOSCOPY;  Service: Cardiovascular;  Laterality: N/A;   TEE WITHOUT CARDIOVERSION N/A 07/15/2022   Procedure: TRANSESOPHAGEAL ECHOCARDIOGRAM;  Surgeon: Francyne Headland, MD;  [facility];  Service: Cardiovascular;  Laterality: N/A;   TEE WITHOUT CARDIOVERSION N/A 04/18/2023   Procedure: TRANSESOPHAGEAL ECHOCARDIOGRAM (TEE);  Surgeon: Lucas Dorise POUR, MD;  Location: [facility] OR;  Service: Open Heart Surgery;  Laterality: N/A;   TOTAL HIP ARTHROPLASTY Left 07/14/2023   Procedure: ARTHROPLASTY, HIP, TOTAL, ANTERIOR APPROACH;  Surgeon: Kendal Franky SQUIBB, MD;  Location[facility];  Service: Orthopedics;  Laterality: Left;   Patient Active Problem List   Diagnosis Date Noted   Left displaced femoral neck fracture (HCC) 07/13/2023   Closed left femoral fracture (HCC) 07/12/2023   History of CHF (congestive heart failure) 07/12/2023   Chronic kidney disease (CKD), stage III (moderate) (HCC) 07/12/2023   Hyperlipidemia 07/12/2023   BPH (benign prostatic hyperplasia) 07/12/2023   Peripheral neuropathy 07/12/2023   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   Bradycardia 04/14/2023   NSTEMI (non-ST elevated myocardial infarction) (HCC) 04/13/2023   Chronic bilateral low back pain 04/13/2023   History of CABG in  04/2023 04/12/2023   Acute  decompensated heart failure (HCC) 04/10/2023   Chronic pain syndrome 04/09/2023   MRSA infection greater than 3 months ago 04/09/2023   Hypoglycemia 10/06/2022   Pleural effusion 09/29/2022   Chronic systolic CHF (congestive heart failure) (HCC) 09/29/2022   Acute urinary retention 09/29/2022   Anxiety 09/29/2022   Hardware complicating wound infection (HCC) 09/25/2022   Discitis of thoracic region 09/25/2022   Hypokalemia 09/07/2022   Acute toxic encephalopathy 09/07/2022   Acute postoperative anemia due to expected blood loss 09/07/2022   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   Adjustment disorder with mixed anxiety and depressed mood 08/13/2022   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 08/08/2022   MRSA bacteremia 08/08/2022   AKI (acute kidney injury), ruled out 07/27/2022   Discitis thoracic region 07/27/2022   Bacteremia due to methicillin resistant Staphylococcus aureus 07/27/2022   Severe protein-calorie malnutrition (HCC) 07/26/2022   PVD (peripheral vascular disease) (HCC) 07/26/2022   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   Bacteremia 07/15/2022   Atelectasis 07/12/2022   Sepsis due to pneumonia (HCC) 07/12/2022   Acute on chronic diastolic (congestive) heart failure (HCC) 07/12/2022   Gangrene of toe of right foot (HCC)    Sepsis (HCC) 08/29/2021   SIRS (systemic inflammatory response syndrome) (HCC) 08/29/2021   Hyponatremia 08/29/2021   Hypomagnesemia 08/29/2021   Pure hypercholesterolemia 06/17/2021   Pain of left hand 09/06/2019   Cellulitis 08/26/2019   Persistent atrial fibrillation (HCC) 08/26/2019   HFimpEF (heart failure with improved ejection fraction) (  HCC) 10/18/2018   Transaminitis 10/17/2018   Hyperbilirubinemia 10/17/2018   Leukocytosis 10/17/2018   Sinusitis 10/17/2018   Chronic anticoagulation 09/30/2017   Smoker 09/30/2017   Cardiomyopathy (HCC) 09/30/2017   Sleep apnea suspected 09/30/2017   Essential hypertension 09/26/2017   Insulin   dependent type 2 diabetes mellitus (HCC) 09/26/2017   Permanent atrial fibrillation (HCC) 09/26/2017    PCP: Delbert Clam, MD  REFERRING PROVIDER: Kendal Franky SQUIBB, MD  REFERRING DIAG: [phone] (ICD-10-CM) - Status post total hip replacement, left  THERAPY DIAG:  Stiffness of left hip, not elsewhere classified  Muscle weakness (generalized)  Difficulty in walking, not elsewhere classified  Cramp and spasm  Pain in left hip  Unsteadiness on feet  Abnormal posture  Other abnormalities of gait and mobility  Rationale for Evaluation and Treatment: Rehabilitation  ONSET DATE: 09/26/2023  SUBJECTIVE:   SUBJECTIVE STATEMENT:    EVAL: Patient had series of orthopedic issues. He was in PT for back pain when he was found to have MRSA infection in spine May 2024, surgery followed for fusion/reconstruction, January had CABG, was still on cane once he got home and fell in bathroom and suffered hip fracture on left with subsequent THA. His THA was on 07/14/23. He was in rehab for several weeks and is now home. He felt he did not get aggressive enough PT in rehab. He then had home health and only did this 1 time per week. He did his exercises and is very motivated to get his function back. He is unsure if he will return to work. He is concerned about the physicality of the job with all that he has endured. He states this all depends on how much function he gets back. His goal is to be able to walk without the cane and build some upper body strength to be able to function at the level he did before and do his job.   PERTINENT HISTORY: Left LE neural impairment since back surgery; right toe amputations See above for extensive medical history  PAIN: 11/28/23 Are you having pain? Yes: NPRS scale: 4/10 Pain location: right hip and left low back Pain description: aching / stiff Aggravating factors: standing > 20 min Relieving factors: moving  PRECAUTIONS: Fall  RED  FLAGS: None   WEIGHT BEARING RESTRICTIONS: No  FALLS:  Has patient fallen in last 6 months? Yes. Number of falls 1  LIVING ENVIRONMENT: Lives with: lives with their spouse Lives in: House/apartment Stairs: No Has following equipment at home: Single point cane  OCCUPATION: chef  PLOF: Independent, Independent with basic ADLs, Independent with household mobility without device, Independent with community mobility without device, Independent with homemaking with ambulation, Independent with gait, and Independent with transfers  PATIENT GOALS: To be able to walk without the cane and build some upper body strength to be able to function at the level he did before and do his job.    NEXT MD VISIT: 2 months  OBJECTIVE:  Note: Objective measures were completed at Evaluation unless otherwise noted.  DIAGNOSTIC FINDINGS: na  PATIENT SURVEYS:  Eval:  LEFS : 30/80= 50% 11/08/2023:  Lower Extremity Functional Score: 26 / 80 = 32.5 % 11/28/2023:  Lower Extremity Functional Score: 38 / 80 = 47.5 %  COGNITION: Overall cognitive status: Within functional limits for tasks assessed     SENSATION: WFL   MUSCLE LENGTH: Hips tight bilaterally in hip flexors, quads and rotation both IR and ER   Left > Right  POSTURE: rounded shoulders, forward  head, decreased lumbar lordosis, posterior pelvic tilt, and flexed trunk    LOWER EXTREMITY ROM:  WFL  LOWER EXTREMITY MMT:  MMT Right eval Left eval Left 11/28/23  Hip flexion  4 4+  Hip extension  4- 4  Hip abduction  4 4+  Hip adduction  4 4+  Hip internal rotation  4 4  Hip external rotation  4 4+  Knee flexion  4 4+  Knee extension  4+ 4+  Ankle dorsiflexion     Ankle plantarflexion     Ankle inversion     Ankle eversion      (Blank rows = not tested)   FUNCTIONAL TESTS:  Eval: 5 times sit to stand: 15.14 sec (no UE use) Timed up and go (TUG): 14.00 sec  11/28/23: 5 times sit to stand: 11.67 sec (no UE use) Timed up and go  (TUG): 9.34 sec  GAIT: Distance walked: 50 feet Assistive device utilized: Single point cane Level of assistance: SBA Comments: antalgic/guarded/ slow                                                                                                                                TREATMENT DATE:   12/22/23:Pt arrives for aquatic physical therapy. Treatment took place in 3.5-5.5 feet of water. Water temperature was 91 degrees F. Pt entered the pool via stairs independently. Seated water bench with 75% submersion Pt performed seated LE AROM exercises 20x in all planes, VC for deep breathing to help relax prior to more strenous exercises. 75% water depth: water walking with running arms 10x in each direction. VC to slow pace walking backwards for better control.  Hip kicks 20x Bil, requires holding on for balance. Sit to stand from 3rd step 3x5. Lateral step ups 2x5 Bil. Handrails area must for balance for each of those exercises. Seated decompression float 2 min for back pain, then 2 min of underwater bicycle, ending with 2 min of decompression.   NuStep L6x6' PT present to discuss current status Seated red loop clam 2x10 Sit to stand from mat table + pad x10 Supine hooklying bridge 2x10 Supine hooklying bil shoulder ext green band from overhead anchor by PT x12 Supine hooklying bil shoulder horiz abd green band x 12 SL clam no resistance - very weak on Lt hip - PT provided proximal joint approximation at hip 2x10 Open book x10 each side Lateral band walks with yellow loop x 5 laps at back counter Standing alternating tap up on cone x 20 with bil UE support Chair sit up 5lb x10, then russian twists 5lb 3x10 Resisted backward walking with belt 10lb x4 reps, close supervision for safety   11/28/2023: Recert assessment completed Nustep level 6 x 6 min with PT present to discuss status Lateral band walks with yellow loop x 5 laps at back counter Seated clamshell with red loop x 20 Standing  hip ER/abduction with yellow loop 2 x 10  Hook lying clam with yellow loop x 20 Side lying clam with no resistance 2 x 10 each LE Hook lying bridge 2 x 10 Standing alternating tap up on cone x 20 Star drill using slider x 5 each side (5 points)  ASSESSMENT:   CLINICAL IMPRESSION:   PATIENT EDUCATION:  Education details: Initiated HEP Person educated: Patient Education method: Programmer, multimedia, Facilities manager, Verbal cues, and Handouts Education comprehension: verbalized understanding and returned demonstration  HOME EXERCISE PROGRAM: Access Code: [email or web address] URL: [email or web address] Date: 11/08/2023 Prepared by: Jarrell Menke  Exercises - Seated Hip Abduction with Resistance  - 1 x daily - 7 x weekly - 1 sets - 20 reps - Seated March with Resistance  - 1 x daily - 7 x weekly - 2 sets - 8 reps - Squat with Chair Touch  - 1 x daily - 7 x weekly - 4 sets - 5 reps - Side Stepping with Resistance at Ankles  - 1 x daily - 7 x weekly - 3 sets - 10 reps - Forward Step Touch  - 2-3 x daily - 7 x weekly - 1 sets - 5-8 reps - Standing 3-way Hip with Walker  - 2-3 x daily - 7 x weekly - 1 sets - 5-8 reps    OBJECTIVE IMPAIRMENTS: Abnormal gait, cardiopulmonary status limiting activity, decreased activity tolerance, decreased balance, decreased endurance, difficulty walking, decreased ROM, decreased strength, increased fascial restrictions, increased muscle spasms, impaired flexibility, impaired UE functional use, improper body mechanics, postural dysfunction, and pain.   ACTIVITY LIMITATIONS: carrying, lifting, bending, sitting, standing, squatting, sleeping, stairs, transfers, bed mobility, bathing, toileting, dressing, reach over head, hygiene/grooming, and caring for others  PARTICIPATION LIMITATIONS: meal prep, cleaning, laundry, driving, shopping, community activity, occupation, yard work, and church  PERSONAL FACTORS: Fitness, Past/current experiences, Profession, Time since  onset of injury/illness/exacerbation, and 3+ comorbidities: spine surgery, CABG, diabetes and Htn are also affecting patient's functional outcome.   REHAB POTENTIAL: Good  CLINICAL DECISION MAKING: Unstable/unpredictable  EVALUATION COMPLEXITY: High   GOALS: Goals reviewed with patient? Yes  SHORT TERM GOALS: Target date: 10/26/2023  Pain report to be no greater than 4/10  Baseline: Goal status: Met on 11/01/23  2.  Patient will be independent with initial HEP  Baseline:  Goal status: Met on 10/18/23  3.  Patient to be able to ambulate 50 feet without a.d Baseline:  Goal status: Met on 11/01/2023  4.  Patient to be able to do 10 sit to stand without UE support  Baseline:  Goal status: Met on 11/08/23    LONG TERM GOALS: Target date: 11/23/2023  Patient to report pain no greater than 2/10  Baseline:  Goal status: Progressing   2.  Patient to be independent with advanced HEP  Baseline:  Goal status: ongoing   3.  Patient to be able to stand or walk for at least 15 min without pain or need to rest Baseline:  Goal status: INITIAL  4.  Patient to be able to ascend and descend 12 steps safely Baseline:  Goal status: Ongoing   5.  Patient to report 85% improvement in overall symptoms and function Baseline:  Goal status: Ongoing  6.  Patient to be able to walk 500 feet without a.d. safely without rest breaks Baseline:  Goal status: Ongoing    PLAN:  PT FREQUENCY: 3x/week  PT DURATION: 8 weeks  PLANNED INTERVENTIONS: 97110-Therapeutic exercises, 97530- Therapeutic activity, [id number]- Neuromuscular re-education, 97535- Self Care, [id number]- Manual therapy, [id number]- Gait training, [phone]- Aquatic  Therapy, G0283- Electrical stimulation (unattended), [phone]- Electrical stimulation (manual), 02983- Vasopneumatic device, [id number]- Ultrasound, [id number]- Ionotophoresis 4mg /ml Dexamethasone , Patient/Family education, Balance training, Stair training, Taping, Joint mobilization, Spinal  mobilization, Cryotherapy, and Moist heat  PLAN FOR NEXT SESSION:   Delon Darner, PTA 12/22/23 11:33 AM     [facility] [address], [address] [place], [place] [address] Phone # [phone] Fax [phone]

## 2023-12-25 ENCOUNTER — Ambulatory Visit

## 2023-12-25 DIAGNOSIS — R262 Difficulty in walking, not elsewhere classified: Secondary | ICD-10-CM

## 2023-12-25 DIAGNOSIS — R252 Cramp and spasm: Secondary | ICD-10-CM

## 2023-12-25 DIAGNOSIS — M25652 Stiffness of left hip, not elsewhere classified: Secondary | ICD-10-CM | POA: Diagnosis not present

## 2023-12-25 DIAGNOSIS — M6281 Muscle weakness (generalized): Secondary | ICD-10-CM

## 2023-12-25 NOTE — Therapy (Signed)
 OUTPATIENT PHYSICAL THERAPY TREATMENT NOTE   Patient Name: David [name] MRN: [id number] DOB:[birth year], 63 y.o., male Today's Date: 12/25/2023  END OF SESSION:  PT End of Session - 12/25/23 1231     Visit Number 17    Date for Recertification  01/18/24    Authorization Type Medicaid no auth 27 visits    PT Start Time 1141    PT Stop Time 1230    PT Time Calculation (min) 49 min    Activity Tolerance Patient tolerated treatment well    Behavior During Therapy WFL for tasks assessed/performed                       Past Medical History:  Diagnosis Date   Cardiomyopathy (HCC)    a. EF 45% in 2019.   CHF (congestive heart failure) (HCC)    Chronic anticoagulation 09/30/2017   Diabetes mellitus type 2 in nonobese [employer])    Does not have health insurance    Essential hypertension 09/26/2017   Financial difficulties    H/O noncompliance with medical treatment, presenting hazards to health    Hardware complicating wound infection (HCC) 09/25/2022   Non-insulin  treated type 2 diabetes mellitus (HCC) 09/26/2017   Persistent atrial fibrillation (HCC)    Sleep apnea suspected 09/30/2017   years ago- not anymore per patient   Past Surgical History:  Procedure Laterality Date   ABDOMINAL AORTOGRAM W/LOWER EXTREMITY N/A 09/02/2021   Procedure: ABDOMINAL AORTOGRAM W/LOWER EXTREMITY;  Surgeon: Serene Gaile ORN, MD;  [facility]: [facility];  Service: Cardiovascular;  Laterality: N/A;   ABDOMINAL AORTOGRAM W/LOWER EXTREMITY N/A 12/28/2021   Procedure: ABDOMINAL AORTOGRAM W/LOWER EXTREMITY;  Surgeon: Serene Gaile ORN, MD;  [facility]: [facility];  Service: Cardiovascular;  Laterality: N/A;   ABDOMINAL AORTOGRAM W/LOWER EXTREMITY N/A 07/28/2022   Procedure: ABDOMINAL AORTOGRAM W/LOWER EXTREMITY;  Surgeon: Gretta Lonni PARAS, MD;  [facility]: [facility];  Service: Cardiovascular;  Laterality: N/A;   AMPUTATION Right 09/04/2021   Procedure: AMPUTATION 4th   RAY FOOT;  Surgeon: Harden Jerona GAILS, MD;  Location: [facility] OR;  Service: Orthopedics;  Laterality: Right;   AMPUTATION Right 08/02/2022   Procedure: AMPUTATION RAY 5TH METATARSAL OF RIGHT FOOT;  Surgeon: Barton Drape, MD;  Location: [facility];  Service: Orthopedics;  Laterality: Right;   APPENDECTOMY  1971   BONE BIOPSY Right 08/02/2022   Procedure: BONE BIOPSY OF 5TH METATARSAL RIGHT FOOT;  Surgeon: Barton Drape, MD;  [facility];  Service: Orthopedics;  Laterality: Right;   CARDIOVERSION N/A 09/28/2017   Procedure: CARDIOVERSION;  Surgeon: Francyne Headland, MD;  [facility];  Service: Cardiovascular;  Laterality: N/A;   CLIPPING OF ATRIAL APPENDAGE N/A 04/18/2023   Procedure: CLIPPING OF ATRIAL APPENDAGE USING ATRICURE ATRICLIP [id number] with pulmonary vein isolation ablation;  Surgeon: Lucas Dorise POUR, MD;  [facility];  Service: Open Heart Surgery;  Laterality: N/A;   CORONARY ARTERY BYPASS GRAFT N/A 04/18/2023   Procedure: CORONARY ARTERY BYPASS GRAFTING (CABG) TIMES FOUR USING LEFT INTERNAL MAMMARY ARTERY AND ENDOSCOPICALLY HARVESTED LEFT GREATER SAPHENOUS VEIN;  Surgeon: Lucas Dorise POUR, MD;  [facility];  Service: Open Heart Surgery;  Laterality: N/A;   IRRIGATION AND DEBRIDEMENT FOOT  08/02/2022   Procedure: IRRIGATION AND DEBRIDEMENT RIGHT 5TH METATARSAL FOOT;  Surgeon: Barton Drape, MD;  [facility];  Service: Orthopedics;;   LAMINECTOMY WITH POSTERIOR LATERAL ARTHRODESIS LEVEL 4 N/A 08/30/2022   Procedure: THORACIC FIVE-THORACIC ELEVEN FUSION, THORACIC EIGHT  TRANSPEDICULAR DECOMPRESSION AND PARTIAL CORPECTOMY with O-Arm;  Surgeon: Debby Dorn MATSU, MD;  Location: [facility] OR;  Service: Neurosurgery;  Laterality: N/A;   LEFT HEART CATH AND CORONARY ANGIOGRAPHY N/A 04/12/2023   Procedure: LEFT HEART CATH AND CORONARY ANGIOGRAPHY;  Surgeon: Verlin Lonni BIRCH, MD;  [facility];  Service: Cardiovascular;  Laterality: N/A;   NASAL ENDOSCOPY Right  06/30/2023   Procedure: ENDOSCOPY, NOSE;  Surgeon: Jesus Oliphant, MD;  Location: [facility] OR;  Service: ENT;  Laterality: Right;   PERIPHERAL VASCULAR BALLOON ANGIOPLASTY  09/02/2021   Procedure: PERIPHERAL VASCULAR BALLOON ANGIOPLASTY;  Surgeon: Serene Gaile ORN, MD;  [facility];  Service: Cardiovascular;;   PERIPHERAL VASCULAR BALLOON ANGIOPLASTY  12/28/2021   Procedure: PERIPHERAL VASCULAR BALLOON ANGIOPLASTY;  Surgeon: Serene Gaile ORN, MD;  [facility];  Service: Cardiovascular;;  Posterior Tibial PTA only   POLYPECTOMY Right 06/30/2023   Procedure: POLYPECTOMY, NASAL CAVITY;  Surgeon: Jesus Oliphant, MD;  Location: [facility] OR;  Service: ENT;  Laterality: Right;   TEE WITHOUT CARDIOVERSION N/A 09/28/2017   Procedure: TRANSESOPHAGEAL ECHOCARDIOGRAM (TEE);  Surgeon: Francyne Headland, MD;  Location: [facility];  Service: Cardiovascular;  Laterality: N/A;   TEE WITHOUT CARDIOVERSION N/A 09/03/2021   Procedure: TRANSESOPHAGEAL ECHOCARDIOGRAM (TEE);  Surgeon: Raford Riggs, MD;  Location: [facility] ENDOSCOPY;  Service: Cardiovascular;  Laterality: N/A;   TEE WITHOUT CARDIOVERSION N/A 07/15/2022   Procedure: TRANSESOPHAGEAL ECHOCARDIOGRAM;  Surgeon: Francyne Headland, MD;  [facility];  Service: Cardiovascular;  Laterality: N/A;   TEE WITHOUT CARDIOVERSION N/A 04/18/2023   Procedure: TRANSESOPHAGEAL ECHOCARDIOGRAM (TEE);  Surgeon: Lucas Dorise POUR, MD;  Location: [facility] OR;  Service: Open Heart Surgery;  Laterality: N/A;   TOTAL HIP ARTHROPLASTY Left 07/14/2023   Procedure: ARTHROPLASTY, HIP, TOTAL, ANTERIOR APPROACH;  Surgeon: Kendal Franky SQUIBB, MD;  Location: [facility];  Service: Orthopedics;  Laterality: Left;   Patient Active Problem List   Diagnosis Date Noted   Left displaced femoral neck fracture (HCC) 07/13/2023   Closed left femoral fracture (HCC) 07/12/2023   History of CHF (congestive heart failure) 07/12/2023   Chronic kidney disease (CKD), stage III (moderate)  (HCC) 07/12/2023   Hyperlipidemia 07/12/2023   BPH (benign prostatic hyperplasia) 07/12/2023   Peripheral neuropathy 07/12/2023   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   Bradycardia 04/14/2023   NSTEMI (non-ST elevated myocardial infarction) (HCC) 04/13/2023   Chronic bilateral low back pain 04/13/2023   History of CABG in  04/2023 04/12/2023   Acute decompensated heart failure (HCC) 04/10/2023   Chronic pain syndrome 04/09/2023   MRSA infection greater than 3 months ago 04/09/2023   Hypoglycemia 10/06/2022   Pleural effusion 09/29/2022   Chronic systolic CHF (congestive heart failure) (HCC) 09/29/2022   Acute urinary retention 09/29/2022   Anxiety 09/29/2022   Hardware complicating wound infection (HCC) 09/25/2022   Discitis of thoracic region 09/25/2022   Hypokalemia 09/07/2022   Acute toxic encephalopathy 09/07/2022   Acute postoperative anemia due to expected blood loss 09/07/2022   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   Adjustment disorder with mixed anxiety and depressed mood 08/13/2022   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 08/08/2022   MRSA bacteremia 08/08/2022   AKI (acute kidney injury), ruled out 07/27/2022   Discitis thoracic region 07/27/2022   Bacteremia due to methicillin resistant Staphylococcus aureus 07/27/2022   Severe protein-calorie malnutrition (HCC) 07/26/2022   PVD (peripheral vascular disease) (HCC) 07/26/2022   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   Bacteremia 07/15/2022   Atelectasis 07/12/2022  Sepsis due to pneumonia (HCC) 07/12/2022   Acute on chronic diastolic (congestive) heart failure (HCC) 07/12/2022   Gangrene of toe of right foot (HCC)    Sepsis (HCC) 08/29/2021   SIRS (systemic inflammatory response syndrome) (HCC) 08/29/2021   Hyponatremia 08/29/2021   Hypomagnesemia 08/29/2021   Pure hypercholesterolemia 06/17/2021   Pain of left hand 09/06/2019   Cellulitis 08/26/2019   Persistent atrial fibrillation (HCC) 08/26/2019   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) 10/18/2018   Transaminitis 10/17/2018   Hyperbilirubinemia 10/17/2018   Leukocytosis 10/17/2018   Sinusitis 10/17/2018   Chronic anticoagulation 09/30/2017   Smoker 09/30/2017   Cardiomyopathy (HCC) 09/30/2017   Sleep apnea suspected 09/30/2017   Essential hypertension 09/26/2017   Insulin  dependent type 2 diabetes mellitus (HCC) 09/26/2017   Permanent atrial fibrillation (HCC) 09/26/2017    PCP: Delbert Clam, MD  REFERRING PROVIDER: Kendal Franky SQUIBB, MD  REFERRING DIAG: [phone] (ICD-10-CM) - Status post total hip replacement, left  THERAPY DIAG:  Stiffness of left hip, not elsewhere classified  Muscle weakness (generalized)  Difficulty in walking, not elsewhere classified  Cramp and spasm  Rationale for Evaluation and Treatment: Rehabilitation  ONSET DATE: 09/26/2023  SUBJECTIVE:   SUBJECTIVE STATEMENT:  I'm not feeling it today. I feel like I'll be ready to be done with PT at the end of my visits.  I barely need my cane.    EVAL: Patient had series of orthopedic issues. He was in PT for back pain when he was found to have MRSA infection in spine May 2024, surgery followed for fusion/reconstruction, January had CABG, was still on cane once he got home and fell in bathroom and suffered hip fracture on left with subsequent THA. His THA was on 07/14/23. He was in rehab for several weeks and is now home. He felt he did not get aggressive enough PT in rehab. He then had home health and only did this 1 time per week. He did his exercises and is very motivated to get his function back. He is unsure if he will return to work. He is concerned about the physicality of the job with all that he has endured. He states this all depends on how much function he gets back. His goal is to be able to walk without the cane and build some upper body strength to be able to function at the level he did before and do his job.   PERTINENT HISTORY: Left LE  neural impairment since back surgery; right toe amputations See above for extensive medical history  PAIN: 11/28/23 Are you having pain? Yes: NPRS scale: 4/10 Pain location: right hip and left low back Pain description: aching / stiff Aggravating factors: standing > 20 min Relieving factors: moving  PRECAUTIONS: Fall  RED FLAGS: None   WEIGHT BEARING RESTRICTIONS: No  FALLS:  Has patient fallen in last 6 months? Yes. Number of falls 1  LIVING ENVIRONMENT: Lives with: lives with their spouse Lives in: House/apartment Stairs: No Has following equipment at home: Single point cane  OCCUPATION: chef  PLOF: Independent, Independent with basic ADLs, Independent with household mobility without device, Independent with community mobility without device, Independent with homemaking with ambulation, Independent with gait, and Independent with transfers  PATIENT GOALS: To be able to walk without the cane and build some upper body strength to be able to function at the level he did before and do his job.    NEXT MD VISIT: 2 months  OBJECTIVE:  Note:  Objective measures were completed at Evaluation unless otherwise noted.  DIAGNOSTIC FINDINGS: na  PATIENT SURVEYS:  Eval:  LEFS : 30/80= 50% 11/08/2023:  Lower Extremity Functional Score: 26 / 80 = 32.5 % 11/28/2023:  Lower Extremity Functional Score: 38 / 80 = 47.5 %  COGNITION: Overall cognitive status: Within functional limits for tasks assessed     SENSATION: WFL   MUSCLE LENGTH: Hips tight bilaterally in hip flexors, quads and rotation both IR and ER   Left > Right  POSTURE: rounded shoulders, forward head, decreased lumbar lordosis, posterior pelvic tilt, and flexed trunk    LOWER EXTREMITY ROM:  WFL  LOWER EXTREMITY MMT:  MMT Right eval Left eval Left 11/28/23  Hip flexion  4 4+  Hip extension  4- 4  Hip abduction  4 4+  Hip adduction  4 4+  Hip internal rotation  4 4  Hip external rotation  4 4+  Knee flexion   4 4+  Knee extension  4+ 4+  Ankle dorsiflexion     Ankle plantarflexion     Ankle inversion     Ankle eversion      (Blank rows = not tested)   FUNCTIONAL TESTS:  Eval: 5 times sit to stand: 15.14 sec (no UE use) Timed up and go (TUG): 14.00 sec  11/28/23: 5 times sit to stand: 11.67 sec (no UE use) Timed up and go (TUG): 9.34 sec  GAIT: Distance walked: 50 feet Assistive device utilized: Single point cane Level of assistance: SBA Comments: antalgic/guarded/ slow                                                                                                                                TREATMENT DATE:  12/25/23 NuStep L6x6' PT present to discuss current status Seated red loop clam 2x10 Sit to stand from mat table + pad x10 Step-down: 6 step with balance pad under to reduce step height x10 Rt and Lt Supine hooklying bridge 2x10 Seated ER with green band 2x10 Seated bil shoulder horiz abd green band  2x10 SL clam no resistance - very weak on Lt hip - PT provided proximal joint approximation at hip 2x10 Lateral band walks with yellow loop x 5 laps at back counter Standing alternating tap up on cone x 20 with bil UE support Chair sit up 5lb 2x10, then russian twists 5lb 2x10 Resisted backward walking with belt 10lb x 7 reps, close supervision for safety  12/22/23:Pt arrives for aquatic physical therapy. Treatment took place in 3.5-5.5 feet of water. Water temperature was 91 degrees F. Pt entered the pool via stairs independently. Seated water bench with 75% submersion Pt performed seated LE AROM exercises 20x in all planes, VC for deep breathing to help relax prior to more strenous exercises. 75% water depth: water walking with running arms 10x in each direction. VC to slow pace walking backwards for better control.  Hip kicks 20x Bil, requires  holding on for balance. Sit to stand from 3rd step 3x5. Lateral step ups 2x5 Bil. Handrails area must for balance for each of those  exercises. Seated decompression float 2 min for back pain, then 2 min of underwater bicycle, ending with 2 min of decompression.   NuStep L6x6' PT present to discuss current status Seated red loop clam 2x10 Sit to stand from mat table + pad x10 Supine hooklying bridge 2x10 Supine hooklying bil shoulder ext green band from overhead anchor by PT x12 Supine hooklying bil shoulder horiz abd green band x 12 SL clam no resistance - very weak on Lt hip - PT provided proximal joint approximation at hip 2x10 Open book x10 each side Lateral band walks with yellow loop x 5 laps at back counter Standing alternating tap up on cone x 20 with bil UE support Chair sit up 5lb x10, then russian twists 5lb 3x10 Resisted backward walking with belt 10lb x4 reps, close supervision for safety     ASSESSMENT:   CLINICAL IMPRESSION:  Pt is making steady progress with PT.  He is mostly weaned from his cane and is only using for household distances.  He is independent and compliant with HEP for strength gains. PT with reduced eccentric control on the Lt LE with step-down.  He is doing with step-to pattern at home and max UE support for safety.   PT monitored throughout session for safety, pain, and fatigue. Patient will benefit from skilled PT to address the below impairments and improve overall function.   PATIENT EDUCATION:  Education details: Initiated HEP Person educated: Patient Education method: Programmer, multimedia, Facilities manager, Verbal cues, and Handouts Education comprehension: verbalized understanding and returned demonstration  HOME EXERCISE PROGRAM: Access Code: [email or web address] URL: [email or web address] Date: 11/08/2023 Prepared by: Jarrell Menke  Exercises - Seated Hip Abduction with Resistance  - 1 x daily - 7 x weekly - 1 sets - 20 reps - Seated March with Resistance  - 1 x daily - 7 x weekly - 2 sets - 8 reps - Squat with Chair Touch  - 1 x daily - 7 x weekly - 4 sets - 5 reps - Side  Stepping with Resistance at Ankles  - 1 x daily - 7 x weekly - 3 sets - 10 reps - Forward Step Touch  - 2-3 x daily - 7 x weekly - 1 sets - 5-8 reps - Standing 3-way Hip with Walker  - 2-3 x daily - 7 x weekly - 1 sets - 5-8 reps    OBJECTIVE IMPAIRMENTS: Abnormal gait, cardiopulmonary status limiting activity, decreased activity tolerance, decreased balance, decreased endurance, difficulty walking, decreased ROM, decreased strength, increased fascial restrictions, increased muscle spasms, impaired flexibility, impaired UE functional use, improper body mechanics, postural dysfunction, and pain.   ACTIVITY LIMITATIONS: carrying, lifting, bending, sitting, standing, squatting, sleeping, stairs, transfers, bed mobility, bathing, toileting, dressing, reach over head, hygiene/grooming, and caring for others  PARTICIPATION LIMITATIONS: meal prep, cleaning, laundry, driving, shopping, community activity, occupation, yard work, and church  PERSONAL FACTORS: Fitness, Past/current experiences, Profession, Time since onset of injury/illness/exacerbation, and 3+ comorbidities: spine surgery, CABG, diabetes and Htn are also affecting patient's functional outcome.   REHAB POTENTIAL: Good  CLINICAL DECISION MAKING: Unstable/unpredictable  EVALUATION COMPLEXITY: High   GOALS: Goals reviewed with patient? Yes  SHORT TERM GOALS: Target date: 10/26/2023  Pain report to be no greater than 4/10  Baseline: Goal status: Met on 11/01/23  2.  Patient will be independent with initial HEP  Baseline:  Goal status: Met on 10/18/23  3.  Patient to be able to ambulate 50 feet without a.d Baseline:  Goal status: Met on 11/01/2023  4.  Patient to be able to do 10 sit to stand without UE support  Baseline:  Goal status: Met on 11/08/23    LONG TERM GOALS: Target date: 11/23/2023  Patient to report pain no greater than 2/10  Baseline:  Goal status: Progressing   2.  Patient to be independent with advanced  HEP  Baseline:  Goal status: ongoing   3.  Patient to be able to stand or walk for at least 15 min without pain or need to rest Baseline:  Goal status: INITIAL  4.  Patient to be able to ascend and descend 12 steps safely Baseline: max UE support and reduced eccentric control on the Lt (12/25/23) Goal status: Ongoing   5.  Patient to report 85% improvement in overall symptoms and function Baseline:  Goal status: Ongoing  6.  Patient to be able to walk 500 feet without a.d. safely without rest breaks Baseline:  Goal status: Ongoing    PLAN:  PT FREQUENCY: 3x/week  PT DURATION: 8 weeks  PLANNED INTERVENTIONS: 97110-Therapeutic exercises, 97530- Therapeutic activity, 97112- Neuromuscular re-education, [phone]- Self Care, 02859- Manual therapy, [phone]- Gait training, [phone]- Aquatic Therapy, [phone]- Electrical stimulation (unattended), [phone]- Electrical stimulation (manual), 97016- Vasopneumatic device, [id number]- Ultrasound, [id number]- Ionotophoresis 4mg /ml Dexamethasone , Patient/Family education, Balance training, Stair training, Taping, Joint mobilization, Spinal mobilization, Cryotherapy, and Moist heat  PLAN FOR NEXT SESSION: endurance, strength, gait, standing   Burnard Joy, PT 12/25/23 12:32 PM     [facility] [address] [place], [place] [address] Phone # [phone] Fax [phone]

## 2024-01-01 ENCOUNTER — Ambulatory Visit

## 2024-01-01 DIAGNOSIS — R262 Difficulty in walking, not elsewhere classified: Secondary | ICD-10-CM

## 2024-01-01 DIAGNOSIS — R2681 Unsteadiness on feet: Secondary | ICD-10-CM

## 2024-01-01 DIAGNOSIS — M6281 Muscle weakness (generalized): Secondary | ICD-10-CM

## 2024-01-01 DIAGNOSIS — M25652 Stiffness of left hip, not elsewhere classified: Secondary | ICD-10-CM

## 2024-01-01 DIAGNOSIS — M25552 Pain in left hip: Secondary | ICD-10-CM

## 2024-01-01 DIAGNOSIS — R293 Abnormal posture: Secondary | ICD-10-CM

## 2024-01-01 NOTE — Therapy (Signed)
 OUTPATIENT PHYSICAL THERAPY TREATMENT NOTE   Patient Name: David [name] MRN: [id number] DOB:09/29/1960, 63 y.o., male Today's Date: 01/01/2024  END OF SESSION:  PT End of Session - 01/01/24 1234     Visit Number 18    Date for Recertification  01/18/24    Authorization Type Medicaid no auth 27 visits    PT Start Time 1146    PT Stop Time 1230    PT Time Calculation (min) 44 min    Activity Tolerance Patient tolerated treatment well    Behavior During Therapy WFL for tasks assessed/performed                        Past Medical History:  Diagnosis Date   Cardiomyopathy (HCC)    a. EF 45% in 2019.   CHF (congestive heart failure) (HCC)    Chronic anticoagulation 09/30/2017   Diabetes mellitus type 2 in nonobese [facility])    Does not have health insurance    Essential hypertension 09/26/2017   Financial difficulties    H/O noncompliance with medical treatment, presenting hazards to health    Hardware complicating wound infection 09/25/2022   Non-insulin  treated type 2 diabetes mellitus (HCC) 09/26/2017   Persistent atrial fibrillation (HCC)    Sleep apnea suspected 09/30/2017   years ago- not anymore per patient   Past Surgical History:  Procedure Laterality Date   ABDOMINAL AORTOGRAM W/LOWER EXTREMITY N/A 09/02/2021   Procedure: ABDOMINAL AORTOGRAM W/LOWER EXTREMITY;  Surgeon: Serene Gaile ORN, MD;  [facility]: [facility];  Service: Cardiovascular;  Laterality: N/A;   ABDOMINAL AORTOGRAM W/LOWER EXTREMITY N/A 12/28/2021   Procedure: ABDOMINAL AORTOGRAM W/LOWER EXTREMITY;  Surgeon: Serene Gaile ORN, MD;  [facility]: [facility];  Service: Cardiovascular;  Laterality: N/A;   ABDOMINAL AORTOGRAM W/LOWER EXTREMITY N/A 07/28/2022   Procedure: ABDOMINAL AORTOGRAM W/LOWER EXTREMITY;  Surgeon: Gretta Lonni PARAS, MD;  [facility]: [facility];  Service: Cardiovascular;  Laterality: N/A;   AMPUTATION Right 09/04/2021   Procedure: AMPUTATION 4th  RAY  FOOT;  Surgeon: Harden Jerona GAILS, MD;  Location: [facility] OR;  Service: Orthopedics;  Laterality: Right;   AMPUTATION Right 08/02/2022   Procedure: AMPUTATION RAY 5TH METATARSAL OF RIGHT FOOT;  Surgeon: Barton Drape, MD;  Location: [facility];  Service: Orthopedics;  Laterality: Right;   APPENDECTOMY  1971   BONE BIOPSY Right 08/02/2022   Procedure: BONE BIOPSY OF 5TH METATARSAL RIGHT FOOT;  Surgeon: Barton Drape, MD;  Location: [facility];  Service: Orthopedics;  Laterality: Right;   CARDIOVERSION N/A 09/28/2017   Procedure: CARDIOVERSION;  Surgeon: Francyne Headland, MD;  Location: [facility];  Service: Cardiovascular;  Laterality: N/A;   CLIPPING OF ATRIAL APPENDAGE N/A 04/18/2023   Procedure: CLIPPING OF ATRIAL APPENDAGE USING ATRICURE ATRICLIP [id number] with pulmonary vein isolation ablation;  Surgeon: Lucas Dorise POUR, MD;  Location: [facility];  Service: Open Heart Surgery;  Laterality: N/A;   CORONARY ARTERY BYPASS GRAFT N/A 04/18/2023   Procedure: CORONARY ARTERY BYPASS GRAFTING (CABG) TIMES FOUR USING LEFT INTERNAL MAMMARY ARTERY AND ENDOSCOPICALLY HARVESTED LEFT GREATER SAPHENOUS VEIN;  Surgeon: Lucas Dorise POUR, MD;  Location: [facility];  Service: Open Heart Surgery;  Laterality: N/A;   IRRIGATION AND DEBRIDEMENT FOOT  08/02/2022   Procedure: IRRIGATION AND DEBRIDEMENT RIGHT 5TH METATARSAL FOOT;  Surgeon: Barton Drape, MD;  [facility]: [facility];  Service: Orthopedics;;   LAMINECTOMY WITH POSTERIOR LATERAL ARTHRODESIS LEVEL 4 N/A 08/30/2022   Procedure: THORACIC FIVE-THORACIC ELEVEN FUSION, THORACIC EIGHT  TRANSPEDICULAR DECOMPRESSION AND PARTIAL CORPECTOMY with O-Arm;  Surgeon: Debby Dorn MATSU, MD;  Location: [facility] OR;  Service: Neurosurgery;  Laterality: N/A;   LEFT HEART CATH AND CORONARY ANGIOGRAPHY N/A 04/12/2023   Procedure: LEFT HEART CATH AND CORONARY ANGIOGRAPHY;  Surgeon: Verlin Lonni BIRCH, MD;  [facility];  Service: Cardiovascular;  Laterality: N/A;   NASAL ENDOSCOPY Right  06/30/2023   Procedure: ENDOSCOPY, NOSE;  Surgeon: Jesus Oliphant, MD;  Location: [facility] OR;  Service: ENT;  Laterality: Right;   PERIPHERAL VASCULAR BALLOON ANGIOPLASTY  09/02/2021   Procedure: PERIPHERAL VASCULAR BALLOON ANGIOPLASTY;  Surgeon: Serene Gaile ORN, MD;  [facility];  Service: Cardiovascular;;   PERIPHERAL VASCULAR BALLOON ANGIOPLASTY  12/28/2021   Procedure: PERIPHERAL VASCULAR BALLOON ANGIOPLASTY;  Surgeon: Serene Gaile ORN, MD;  [facility];  Service: Cardiovascular;;  Posterior Tibial PTA only   POLYPECTOMY Right 06/30/2023   Procedure: POLYPECTOMY, NASAL CAVITY;  Surgeon: Jesus Oliphant, MD;  Location: [facility] OR;  Service: ENT;  Laterality: Right;   TEE WITHOUT CARDIOVERSION N/A 09/28/2017   Procedure: TRANSESOPHAGEAL ECHOCARDIOGRAM (TEE);  Surgeon: Francyne Headland, MD;  Location: [facility] ENDOSCOPY;  Service: Cardiovascular;  Laterality: N/A;   TEE WITHOUT CARDIOVERSION N/A 09/03/2021   Procedure: TRANSESOPHAGEAL ECHOCARDIOGRAM (TEE);  Surgeon: Raford Riggs, MD;  Location: [facility] ENDOSCOPY;  Service: Cardiovascular;  Laterality: N/A;   TEE WITHOUT CARDIOVERSION N/A 07/15/2022   Procedure: TRANSESOPHAGEAL ECHOCARDIOGRAM;  Surgeon: Francyne Headland, MD;  [facility];  Service: Cardiovascular;  Laterality: N/A;   TEE WITHOUT CARDIOVERSION N/A 04/18/2023   Procedure: TRANSESOPHAGEAL ECHOCARDIOGRAM (TEE);  Surgeon: Lucas Dorise POUR, MD;  Location: [facility] OR;  Service: Open Heart Surgery;  Laterality: N/A;   TOTAL HIP ARTHROPLASTY Left 07/14/2023   Procedure: ARTHROPLASTY, HIP, TOTAL, ANTERIOR APPROACH;  Surgeon: Kendal Franky SQUIBB, MD;  Location: [facility];  Service: Orthopedics;  Laterality: Left;   Patient Active Problem List   Diagnosis Date Noted   Left displaced femoral neck fracture (HCC) 07/13/2023   Closed left femoral fracture (HCC) 07/12/2023   History of CHF (congestive heart failure) 07/12/2023   Chronic kidney disease (CKD), stage III (moderate)  (HCC) 07/12/2023   Hyperlipidemia 07/12/2023   BPH (benign prostatic hyperplasia) 07/12/2023   Peripheral neuropathy 07/12/2023   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   Bradycardia 04/14/2023   NSTEMI (non-ST elevated myocardial infarction) (HCC) 04/13/2023   Chronic bilateral low back pain 04/13/2023   History of CABG in  04/2023 04/12/2023   Acute decompensated heart failure (HCC) 04/10/2023   Chronic pain syndrome 04/09/2023   MRSA infection greater than 3 months ago 04/09/2023   Hypoglycemia 10/06/2022   Pleural effusion 09/29/2022   Chronic systolic CHF (congestive heart failure) (HCC) 09/29/2022   Acute urinary retention 09/29/2022   Anxiety 09/29/2022   Hardware complicating wound infection 09/25/2022   Discitis of thoracic region 09/25/2022   Hypokalemia 09/07/2022   Acute toxic encephalopathy 09/07/2022   Acute postoperative anemia due to expected blood loss 09/07/2022   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   Adjustment disorder with mixed anxiety and depressed mood 08/13/2022   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 08/08/2022   MRSA bacteremia 08/08/2022   AKI (acute kidney injury), ruled out 07/27/2022   Discitis thoracic region 07/27/2022   Bacteremia due to methicillin resistant Staphylococcus aureus 07/27/2022   Severe protein-calorie malnutrition 07/26/2022   PVD (peripheral vascular disease) 07/26/2022   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   Bacteremia 07/15/2022   Atelectasis 07/12/2022   Sepsis due  to pneumonia (HCC) 07/12/2022   Acute on chronic diastolic (congestive) heart failure (HCC) 07/12/2022   Gangrene of toe of right foot (HCC)    Sepsis (HCC) 08/29/2021   SIRS (systemic inflammatory response syndrome) (HCC) 08/29/2021   Hyponatremia 08/29/2021   Hypomagnesemia 08/29/2021   Pure hypercholesterolemia 06/17/2021   Pain of left hand 09/06/2019   Cellulitis 08/26/2019   Persistent atrial fibrillation (HCC) 08/26/2019   HFimpEF (heart failure with improved  ejection fraction) (HCC) 10/18/2018   Transaminitis 10/17/2018   Hyperbilirubinemia 10/17/2018   Leukocytosis 10/17/2018   Sinusitis 10/17/2018   Chronic anticoagulation 09/30/2017   Smoker 09/30/2017   Cardiomyopathy (HCC) 09/30/2017   Sleep apnea suspected 09/30/2017   Essential hypertension 09/26/2017   Insulin  dependent type 2 diabetes mellitus (HCC) 09/26/2017   Permanent atrial fibrillation (HCC) 09/26/2017    PCP: Delbert Clam, MD  REFERRING PROVIDER: Kendal Franky SQUIBB, MD  REFERRING DIAG: [phone] (ICD-10-CM) - Status post total hip replacement, left  THERAPY DIAG:  Stiffness of left hip, not elsewhere classified  Muscle weakness (generalized)  Difficulty in walking, not elsewhere classified  Pain in left hip  Unsteadiness on feet  Abnormal posture  Rationale for Evaluation and Treatment: Rehabilitation  ONSET DATE: 09/26/2023  SUBJECTIVE:   SUBJECTIVE STATEMENT:  I don't use my cane all the time.    EVAL: Patient had series of orthopedic issues. He was in PT for back pain when he was found to have MRSA infection in spine May 2024, surgery followed for fusion/reconstruction, January had CABG, was still on cane once he got home and fell in bathroom and suffered hip fracture on left with subsequent THA. His THA was on 07/14/23. He was in rehab for several weeks and is now home. He felt he did not get aggressive enough PT in rehab. He then had home health and only did this 1 time per week. He did his exercises and is very motivated to get his function back. He is unsure if he will return to work. He is concerned about the physicality of the job with all that he has endured. He states this all depends on how much function he gets back. His goal is to be able to walk without the cane and build some upper body strength to be able to function at the level he did before and do his job.   PERTINENT HISTORY: Left LE neural impairment since back surgery; right toe  amputations See above for extensive medical history  PAIN: 01/01/24 Are you having pain? Yes: NPRS scale: 2/10 Pain location: right hip and left low back Pain description: aching / stiff Aggravating factors: standing > 20 min, weather changes  Relieving factors: moving  PRECAUTIONS: Fall  RED FLAGS: None   WEIGHT BEARING RESTRICTIONS: No  FALLS:  Has patient fallen in last 6 months? Yes. Number of falls 1  LIVING ENVIRONMENT: Lives with: lives with their spouse Lives in: House/apartment Stairs: No Has following equipment at home: Single point cane  OCCUPATION: chef  PLOF: Independent, Independent with basic ADLs, Independent with household mobility without device, Independent with community mobility without device, Independent with homemaking with ambulation, Independent with gait, and Independent with transfers  PATIENT GOALS: To be able to walk without the cane and build some upper body strength to be able to function at the level he did before and do his job.    NEXT MD VISIT: 2 months  OBJECTIVE:  Note: Objective measures were completed at Evaluation unless otherwise noted.  DIAGNOSTIC  FINDINGS: na  PATIENT SURVEYS:  Eval:  LEFS : 30/80= 50% 11/08/2023:  Lower Extremity Functional Score: 26 / 80 = 32.5 % 11/28/2023:  Lower Extremity Functional Score: 38 / 80 = 47.5 %  COGNITION: Overall cognitive status: Within functional limits for tasks assessed     SENSATION: WFL   MUSCLE LENGTH: Hips tight bilaterally in hip flexors, quads and rotation both IR and ER   Left > Right  POSTURE: rounded shoulders, forward head, decreased lumbar lordosis, posterior pelvic tilt, and flexed trunk    LOWER EXTREMITY ROM:  WFL  LOWER EXTREMITY MMT:  MMT Right eval Left eval Left 11/28/23  Hip flexion  4 4+  Hip extension  4- 4  Hip abduction  4 4+  Hip adduction  4 4+  Hip internal rotation  4 4  Hip external rotation  4 4+  Knee flexion  4 4+  Knee extension  4+ 4+   Ankle dorsiflexion     Ankle plantarflexion     Ankle inversion     Ankle eversion      (Blank rows = not tested)   FUNCTIONAL TESTS:  Eval: 5 times sit to stand: 15.14 sec (no UE use) Timed up and go (TUG): 14.00 sec  11/28/23: 5 times sit to stand: 11.67 sec (no UE use) Timed up and go (TUG): 9.34 sec  GAIT: Distance walked: 50 feet Assistive device utilized: Single point cane Level of assistance: SBA Comments: antalgic/guarded/ slow                                                                                                                                TREATMENT DATE:  01/01/24 NuStep L6x6' PT present to discuss current status Seated red loop clam 2x10 Sit to stand from mat table + pad 2x10 Step-down: 6 step with balance pad under to reduce step height x10 Rt and Lt Pallof press: green band x10 bil Seated ER with green band 2x10 Standing shoulder extension: green band 2x10 Standing alternating tap up on cone x 20 with bil UE support Chair sit up 5lb 2x10, then russian twists 5lb 2x10 Resisted backward walking with belt 10lb x 7 reps, close supervision for safety  12/25/23 NuStep L6x6' PT present to discuss current status Seated red loop clam 2x10 Sit to stand from mat table + pad x10 Step-down: 6 step with balance pad under to reduce step height x10 Rt and Lt Supine hooklying bridge 2x10 Seated ER with green band 2x10 Seated bil shoulder horiz abd green band  2x10 SL clam no resistance - very weak on Lt hip - PT provided proximal joint approximation at hip 2x10 Lateral band walks with yellow loop x 5 laps at back counter Standing alternating tap up on cone x 20 with bil UE support Chair sit up 5lb 2x10, then russian twists 5lb 2x10 Resisted backward walking with belt 10lb x 7 reps, close supervision for safety  12/22/23:Pt  arrives for aquatic physical therapy. Treatment took place in 3.5-5.5 feet of water. Water temperature was 91 degrees F. Pt entered the  pool via stairs independently. Seated water bench with 75% submersion Pt performed seated LE AROM exercises 20x in all planes, VC for deep breathing to help relax prior to more strenous exercises. 75% water depth: water walking with running arms 10x in each direction. VC to slow pace walking backwards for better control.  Hip kicks 20x Bil, requires holding on for balance. Sit to stand from 3rd step 3x5. Lateral step ups 2x5 Bil. Handrails area must for balance for each of those exercises. Seated decompression float 2 min for back pain, then 2 min of underwater bicycle, ending with 2 min of decompression.    ASSESSMENT:   CLINICAL IMPRESSION:  Pt continues to work on strength at home and reports that he is weaning from his cane.  Reduced eccentric control with stand to sit and steps.  He is now able to stand for 1 hour for daily tasks without limitation.  He was challenged with Pallof press today and required close supervision for balance.   PT monitored throughout session for safety, pain, and fatigue. Patient will benefit from skilled PT to address the below impairments and improve overall function.   PATIENT EDUCATION:  Education details: Initiated HEP Person educated: Patient Education method: Programmer, multimedia, Facilities manager, Verbal cues, and Handouts Education comprehension: verbalized understanding and returned demonstration  HOME EXERCISE PROGRAM: Access Code: [email or web address] URL: [email or web address] Date: 11/08/2023 Prepared by: Jarrell Menke  Exercises - Seated Hip Abduction with Resistance  - 1 x daily - 7 x weekly - 1 sets - 20 reps - Seated March with Resistance  - 1 x daily - 7 x weekly - 2 sets - 8 reps - Squat with Chair Touch  - 1 x daily - 7 x weekly - 4 sets - 5 reps - Side Stepping with Resistance at Ankles  - 1 x daily - 7 x weekly - 3 sets - 10 reps - Forward Step Touch  - 2-3 x daily - 7 x weekly - 1 sets - 5-8 reps - Standing 3-way Hip with Walker  - 2-3 x  daily - 7 x weekly - 1 sets - 5-8 reps    OBJECTIVE IMPAIRMENTS: Abnormal gait, cardiopulmonary status limiting activity, decreased activity tolerance, decreased balance, decreased endurance, difficulty walking, decreased ROM, decreased strength, increased fascial restrictions, increased muscle spasms, impaired flexibility, impaired UE functional use, improper body mechanics, postural dysfunction, and pain.   ACTIVITY LIMITATIONS: carrying, lifting, bending, sitting, standing, squatting, sleeping, stairs, transfers, bed mobility, bathing, toileting, dressing, reach over head, hygiene/grooming, and caring for others  PARTICIPATION LIMITATIONS: meal prep, cleaning, laundry, driving, shopping, community activity, occupation, yard work, and church  PERSONAL FACTORS: Fitness, Past/current experiences, Profession, Time since onset of injury/illness/exacerbation, and 3+ comorbidities: spine surgery, CABG, diabetes and Htn are also affecting patient's functional outcome.   REHAB POTENTIAL: Good  CLINICAL DECISION MAKING: Unstable/unpredictable  EVALUATION COMPLEXITY: High   GOALS: Goals reviewed with patient? Yes  SHORT TERM GOALS: Target date: 10/26/2023  Pain report to be no greater than 4/10  Baseline: Goal status: Met on 11/01/23  2.  Patient will be independent with initial HEP  Baseline:  Goal status: Met on 10/18/23  3.  Patient to be able to ambulate 50 feet without a.d Baseline:  Goal status: Met on 11/01/2023  4.  Patient to be able to do 10 sit to stand without UE support  Baseline:  Goal status: Met on 11/08/23    LONG TERM GOALS: Target date: 11/23/2023  Patient to report pain no greater than 2/10  Baseline:  Goal status: Progressing   2.  Patient to be independent with advanced HEP  Baseline:  Goal status: ongoing   3.  Patient to be able to stand or walk for at least 15 min without pain or need to rest Baseline: 1 hour (01/01/24) Goal status: MET  4.  Patient to  be able to ascend and descend 12 steps safely Baseline: max UE support and reduced eccentric control on the Lt (12/25/23) Goal status: Ongoing   5.  Patient to report 85% improvement in overall symptoms and function Baseline:  Goal status: Ongoing  6.  Patient to be able to walk 500 feet without a.d. safely without rest breaks Baseline:  Goal status: Ongoing    PLAN:  PT FREQUENCY: 3x/week  PT DURATION: 8 weeks  PLANNED INTERVENTIONS: 97110-Therapeutic exercises, 97530- Therapeutic activity, 97112- Neuromuscular re-education, [phone]- Self Care, 02859- Manual therapy, [phone]- Gait training, [phone]- Aquatic Therapy, [phone]- Electrical stimulation (unattended), [phone]- Electrical stimulation (manual), 97016- Vasopneumatic device, 97035- Ultrasound, 02966- Ionotophoresis 4mg /ml Dexamethasone , Patient/Family education, Balance training, Stair training, Taping, Joint mobilization, Spinal mobilization, Cryotherapy, and Moist heat  PLAN FOR NEXT SESSION: endurance, strength, gait, standing   Burnard Joy, PT 01/01/24 12:37 PM     [facility] [address], [address] [place], [place] [address] Phone # [phone] Fax [phone]

## 2024-01-02 ENCOUNTER — Ambulatory Visit (HOSPITAL_COMMUNITY): Admitting: Licensed Clinical Social Worker

## 2024-01-02 ENCOUNTER — Encounter (HOSPITAL_COMMUNITY): Payer: Self-pay

## 2024-01-03 NOTE — Telephone Encounter (Signed)
Will forward message to provider. 

## 2024-01-05 ENCOUNTER — Ambulatory Visit: Admitting: Physical Therapy

## 2024-01-06 ENCOUNTER — Other Ambulatory Visit: Payer: Self-pay | Admitting: Family Medicine

## 2024-01-07 MED ORDER — POTASSIUM CHLORIDE ER 20 MEQ PO TBCR
20.0000 meq | EXTENDED_RELEASE_TABLET | Freq: Every day | ORAL | 0 refills | Status: DC
  Filled 2024-01-07: qty 30, 30d supply, fill #0

## 2024-01-07 MED ORDER — DAPAGLIFLOZIN PROPANEDIOL 10 MG PO TABS
10.0000 mg | ORAL_TABLET | Freq: Every day | ORAL | 0 refills | Status: DC
  Filled 2024-01-07: qty 30, 30d supply, fill #0

## 2024-01-08 ENCOUNTER — Other Ambulatory Visit (HOSPITAL_COMMUNITY): Payer: Self-pay

## 2024-01-08 ENCOUNTER — Ambulatory Visit: Attending: Student | Admitting: Physical Therapy

## 2024-01-08 ENCOUNTER — Encounter (HOSPITAL_COMMUNITY): Payer: Self-pay

## 2024-01-08 ENCOUNTER — Encounter: Payer: Self-pay | Admitting: Physical Therapy

## 2024-01-08 ENCOUNTER — Other Ambulatory Visit: Payer: Self-pay | Admitting: Family Medicine

## 2024-01-08 ENCOUNTER — Other Ambulatory Visit: Payer: Self-pay

## 2024-01-08 DIAGNOSIS — M6281 Muscle weakness (generalized): Secondary | ICD-10-CM | POA: Insufficient documentation

## 2024-01-08 DIAGNOSIS — R2681 Unsteadiness on feet: Secondary | ICD-10-CM | POA: Insufficient documentation

## 2024-01-08 DIAGNOSIS — R293 Abnormal posture: Secondary | ICD-10-CM | POA: Insufficient documentation

## 2024-01-08 DIAGNOSIS — R262 Difficulty in walking, not elsewhere classified: Secondary | ICD-10-CM | POA: Diagnosis present

## 2024-01-08 DIAGNOSIS — M25552 Pain in left hip: Secondary | ICD-10-CM | POA: Diagnosis present

## 2024-01-08 DIAGNOSIS — M25652 Stiffness of left hip, not elsewhere classified: Secondary | ICD-10-CM | POA: Diagnosis present

## 2024-01-08 DIAGNOSIS — M4644 Discitis, unspecified, thoracic region: Secondary | ICD-10-CM

## 2024-01-08 MED ORDER — PREGABALIN 75 MG PO CAPS
75.0000 mg | ORAL_CAPSULE | Freq: Two times a day (BID) | ORAL | 1 refills | Status: DC
  Filled 2024-01-08 – 2024-01-10 (×2): qty 60, 30d supply, fill #0
  Filled 2024-02-07: qty 60, 30d supply, fill #1

## 2024-01-08 NOTE — Therapy (Signed)
 OUTPATIENT PHYSICAL THERAPY TREATMENT NOTE   Patient Name: David [name] MRN: [id number] DOB:23-Aug-1960, 63 y.o., male Today's Date: 01/08/2024  END OF SESSION:  PT End of Session - 01/08/24 1146     Visit Number 19    Number of Visits 27    Date for Recertification  01/18/24    Authorization Type Medicaid no auth 27 visits    PT Start Time 1147    PT Stop Time 1230    PT Time Calculation (min) 43 min    Activity Tolerance Patient tolerated treatment well    Behavior During Therapy WFL for tasks assessed/performed                        Past Medical History:  Diagnosis Date   Cardiomyopathy (HCC)    a. EF 45% in 2019.   CHF (congestive heart failure) (HCC)    Chronic anticoagulation 09/30/2017   Diabetes mellitus type 2 in nonobese [facility])    Does not have health insurance    Essential hypertension 09/26/2017   Financial difficulties    H/O noncompliance with medical treatment, presenting hazards to health    Hardware complicating wound infection 09/25/2022   Non-insulin  treated type 2 diabetes mellitus (HCC) 09/26/2017   Persistent atrial fibrillation (HCC)    Sleep apnea suspected 09/30/2017   years ago- not anymore per patient   Past Surgical History:  Procedure Laterality Date   ABDOMINAL AORTOGRAM W/LOWER EXTREMITY N/A 09/02/2021   Procedure: ABDOMINAL AORTOGRAM W/LOWER EXTREMITY;  Surgeon: Serene Gaile ORN, MD;  Location: [facility];  Service: Cardiovascular;  Laterality: N/A;   ABDOMINAL AORTOGRAM W/LOWER EXTREMITY N/A 12/28/2021   Procedure: ABDOMINAL AORTOGRAM W/LOWER EXTREMITY;  Surgeon: Serene Gaile ORN, MD;  Location: [facility];  Service: Cardiovascular;  Laterality: N/A;   ABDOMINAL AORTOGRAM W/LOWER EXTREMITY N/A 07/28/2022   Procedure: ABDOMINAL AORTOGRAM W/LOWER EXTREMITY;  Surgeon: Gretta Lonni PARAS, MD;  Location: [facility];  Service: Cardiovascular;  Laterality: N/A;   AMPUTATION Right 09/04/2021    Procedure: 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;  Surgeon: Harden Jerona GAILS, MD;  Location: [facility] OR;  Service: Orthopedics;  Laterality: Right;   AMPUTATION Right 08/02/2022   Procedure: AMPUTATION RAY 5TH METATARSAL OF RIGHT FOOT;  Surgeon: Barton Drape, MD;  Location: [facility];  Service: Orthopedics;  Laterality: Right;   APPENDECTOMY  1971   BONE BIOPSY Right 08/02/2022   Procedure: BONE BIOPSY OF 5TH METATARSAL RIGHT FOOT;  Surgeon: Barton Drape, MD;  Location: [facility];  Service: Orthopedics;  Laterality: Right;   CARDIOVERSION N/A 09/28/2017   Procedure: CARDIOVERSION;  Surgeon: Francyne Headland, MD;  [facility]: [facility];  Service: Cardiovascular;  Laterality: N/A;   CLIPPING OF ATRIAL APPENDAGE N/A 04/18/2023   Procedure: CLIPPING OF ATRIAL APPENDAGE USING ATRICURE ATRICLIP [id number] with pulmonary vein isolation ablation;  Surgeon: Lucas Dorise POUR, MD;  Location: [facility] OR;  Service: Open Heart Surgery;  Laterality: N/A;   CORONARY ARTERY BYPASS GRAFT N/A 04/18/2023   Procedure: CORONARY ARTERY BYPASS GRAFTING (CABG) TIMES FOUR USING LEFT INTERNAL MAMMARY ARTERY AND ENDOSCOPICALLY HARVESTED LEFT GREATER SAPHENOUS VEIN;  Surgeon: Lucas Dorise POUR, MD;  Location: [facility];  Service: Open Heart Surgery;  Laterality: N/A;   IRRIGATION AND DEBRIDEMENT FOOT  08/02/2022   Procedure: IRRIGATION AND DEBRIDEMENT RIGHT 5TH METATARSAL FOOT;  Surgeon: Barton Drape, MD;  Location: [facility];  Service: Orthopedics;;   LAMINECTOMY WITH POSTERIOR LATERAL ARTHRODESIS LEVEL 4 N/A 08/30/2022  Procedure: THORACIC FIVE-THORACIC ELEVEN FUSION, THORACIC EIGHT TRANSPEDICULAR DECOMPRESSION AND PARTIAL CORPECTOMY with O-Arm;  Surgeon: Debby Dorn MATSU, MD;  Location: [facility] OR;  Service: Neurosurgery;  Laterality: N/A;   LEFT HEART CATH AND CORONARY ANGIOGRAPHY N/A 04/12/2023   Procedure: LEFT HEART CATH AND CORONARY ANGIOGRAPHY;  Surgeon: Verlin Lonni BIRCH, MD;  [facility]: [facility];  Service: Cardiovascular;  Laterality:  N/A;   NASAL ENDOSCOPY Right 06/30/2023   Procedure: ENDOSCOPY, NOSE;  Surgeon: Jesus Oliphant, MD;  Location: [facility] OR;  Service: ENT;  Laterality: Right;   PERIPHERAL VASCULAR BALLOON ANGIOPLASTY  09/02/2021   Procedure: PERIPHERAL VASCULAR BALLOON ANGIOPLASTY;  Surgeon: Serene Gaile ORN, MD;  [facility];  Service: Cardiovascular;;   PERIPHERAL VASCULAR BALLOON ANGIOPLASTY  12/28/2021   Procedure: PERIPHERAL VASCULAR BALLOON ANGIOPLASTY;  Surgeon: Serene Gaile ORN, MD;  [facility];  Service: Cardiovascular;;  Posterior Tibial PTA only   POLYPECTOMY Right 06/30/2023   Procedure: POLYPECTOMY, NASAL CAVITY;  Surgeon: Jesus Oliphant, MD;  Location[facility] OR;  Service: ENT;  Laterality: Right;   TEE WITHOUT CARDIOVERSION N/A 09/28/2017   Procedure: TRANSESOPHAGEAL ECHOCARDIOGRAM (TEE);  Surgeon: Francyne Headland, MD;  Location: [facility] ENDOSCOPY;  Service: Cardiovascular;  Laterality: N/A;   TEE WITHOUT CARDIOVERSION N/A 09/03/2021   Procedure: TRANSESOPHAGEAL ECHOCARDIOGRAM (TEE);  Surgeon: Raford Riggs, MD;  Location: [facility] ENDOSCOPY;  Service: Cardiovascular;  Laterality: N/A;   TEE WITHOUT CARDIOVERSION N/A 07/15/2022   Procedure: TRANSESOPHAGEAL ECHOCARDIOGRAM;  Surgeon: Francyne Headland, MD;  [facility];  Service: Cardiovascular;  Laterality: N/A;   TEE WITHOUT CARDIOVERSION N/A 04/18/2023   Procedure: TRANSESOPHAGEAL ECHOCARDIOGRAM (TEE);  Surgeon: Lucas Dorise POUR, MD;  Location: [facility] OR;  Service: Open Heart Surgery;  Laterality: N/A;   TOTAL HIP ARTHROPLASTY Left 07/14/2023   Procedure: ARTHROPLASTY, HIP, TOTAL, ANTERIOR APPROACH;  Surgeon: Kendal Franky SQUIBB, MD;  Location: [facility] OR;  Service: Orthopedics;  Laterality: Left;   Patient Active Problem List   Diagnosis Date Noted   Left displaced femoral neck fracture (HCC) 07/13/2023   Closed left femoral fracture (HCC) 07/12/2023   History of CHF (congestive heart failure) 07/12/2023   Chronic kidney disease  (CKD), stage III (moderate) (HCC) 07/12/2023   Hyperlipidemia 07/12/2023   BPH (benign prostatic hyperplasia) 07/12/2023   Peripheral neuropathy 07/12/2023   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   Bradycardia 04/14/2023   NSTEMI (non-ST elevated myocardial infarction) (HCC) 04/13/2023   Chronic bilateral low back pain 04/13/2023   History of CABG in  04/2023 04/12/2023   Acute decompensated heart failure (HCC) 04/10/2023   Chronic pain syndrome 04/09/2023   MRSA infection greater than 3 months ago 04/09/2023   Hypoglycemia 10/06/2022   Pleural effusion 09/29/2022   Chronic systolic CHF (congestive heart failure) (HCC) 09/29/2022   Acute urinary retention 09/29/2022   Anxiety 09/29/2022   Hardware complicating wound infection 09/25/2022   Discitis of thoracic region 09/25/2022   Hypokalemia 09/07/2022   Acute toxic encephalopathy 09/07/2022   Acute postoperative anemia due to expected blood loss 09/07/2022   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   Adjustment disorder with mixed anxiety and depressed mood 08/13/2022   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 08/08/2022   MRSA bacteremia 08/08/2022   AKI (acute kidney injury), ruled out 07/27/2022   Discitis thoracic region 07/27/2022   Bacteremia due to methicillin resistant Staphylococcus aureus 07/27/2022   Severe protein-calorie malnutrition 07/26/2022   PVD (peripheral vascular disease) 07/26/2022   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   Bacteremia 07/15/2022  Atelectasis 07/12/2022   Sepsis due to pneumonia (HCC) 07/12/2022   Acute on chronic diastolic (congestive) heart failure (HCC) 07/12/2022   Gangrene of toe of right foot (HCC)    Sepsis (HCC) 08/29/2021   SIRS (systemic inflammatory response syndrome) (HCC) 08/29/2021   Hyponatremia 08/29/2021   Hypomagnesemia 08/29/2021   Pure hypercholesterolemia 06/17/2021   Pain of left hand 09/06/2019   Cellulitis 08/26/2019   Persistent atrial fibrillation (HCC) 08/26/2019   HFimpEF (heart  failure with improved ejection fraction) (HCC) 10/18/2018   Transaminitis 10/17/2018   Hyperbilirubinemia 10/17/2018   Leukocytosis 10/17/2018   Sinusitis 10/17/2018   Chronic anticoagulation 09/30/2017   Smoker 09/30/2017   Cardiomyopathy (HCC) 09/30/2017   Sleep apnea suspected 09/30/2017   Essential hypertension 09/26/2017   Insulin  dependent type 2 diabetes mellitus (HCC) 09/26/2017   Permanent atrial fibrillation (HCC) 09/26/2017    PCP: Delbert Clam, MD  REFERRING PROVIDER: Kendal Franky SQUIBB, MD  REFERRING DIAG: [phone] (ICD-10-CM) - Status post total hip replacement, left  THERAPY DIAG:  Stiffness of left hip, not elsewhere classified  Muscle weakness (generalized)  Difficulty in walking, not elsewhere classified  Pain in left hip  Unsteadiness on feet  Abnormal posture  Rationale for Evaluation and Treatment: Rehabilitation  ONSET DATE: 09/26/2023  SUBJECTIVE:   SUBJECTIVE STATEMENT:  I am feeling a little lethargic today.  I can walk without the cane if I need to which I couldn't before.  I can do the grocery store without the cart now vs the motorized cart.   I need to get stronger in my legs and my back still hurts.    EVAL: Patient had series of orthopedic issues. He was in PT for back pain when he was found to have MRSA infection in spine May 2024, surgery followed for fusion/reconstruction, January had CABG, was still on cane once he got home and fell in bathroom and suffered hip fracture on left with subsequent THA. His THA was on 07/14/23. He was in rehab for several weeks and is now home. He felt he did not get aggressive enough PT in rehab. He then had home health and only did this 1 time per week. He did his exercises and is very motivated to get his function back. He is unsure if he will return to work. He is concerned about the physicality of the job with all that he has endured. He states this all depends on how much function he gets back. His goal is  to be able to walk without the cane and build some upper body strength to be able to function at the level he did before and do his job.   PERTINENT HISTORY: Left LE neural impairment since back surgery; right toe amputations See above for extensive medical history  PAIN: 01/01/24 Are you having pain? Yes: NPRS scale: 2/10 Pain location: right hip and left low back Pain description: aching / stiff Aggravating factors: standing > 20 min, weather changes  Relieving factors: moving  PRECAUTIONS: Fall  RED FLAGS: None   WEIGHT BEARING RESTRICTIONS: No  FALLS:  Has patient fallen in last 6 months? Yes. Number of falls 1  LIVING ENVIRONMENT: Lives with: lives with their spouse Lives in: House/apartment Stairs: No Has following equipment at home: Single point cane  OCCUPATION: chef  PLOF: Independent, Independent with basic ADLs, Independent with household mobility without device, Independent with community mobility without device, Independent with homemaking with ambulation, Independent with gait, and Independent with transfers  PATIENT GOALS: To be  able to walk without the cane and build some upper body strength to be able to function at the level he did before and do his job.    NEXT MD VISIT: 2 months  OBJECTIVE:  Note: Objective measures were completed at Evaluation unless otherwise noted.  DIAGNOSTIC FINDINGS: na  PATIENT SURVEYS:  Eval:  LEFS : 30/80= 50% 11/08/2023:  Lower Extremity Functional Score: 26 / 80 = 32.5 % 11/28/2023:  Lower Extremity Functional Score: 38 / 80 = 47.5 % 01/08/24: 51/80 = 64%  COGNITION: Overall cognitive status: Within functional limits for tasks assessed     SENSATION: WFL   MUSCLE LENGTH: Hips tight bilaterally in hip flexors, quads and rotation both IR and ER   Left > Right  POSTURE: rounded shoulders, forward head, decreased lumbar lordosis, posterior pelvic tilt, and flexed trunk    LOWER EXTREMITY ROM:  WFL  LOWER EXTREMITY  MMT:  MMT Right eval Left eval Left 11/28/23 Left 01/08/24  Hip flexion  4 4+ 4+  Hip extension  4- 4 4+  Hip abduction  4 4+ 4+  Hip adduction  4 4+ 5  Hip internal rotation  4 4 4+  Hip external rotation  4 4+ 4+  Knee flexion  4 4+ 5  Knee extension  4+ 4+ Unable to control quad eccentrically for descending stairs, open chain testing: 4+/5  Ankle dorsiflexion      Ankle plantarflexion      Ankle inversion      Ankle eversion       (Blank rows = not tested)   FUNCTIONAL TESTS:  Eval: 5 times sit to stand: 15.14 sec (no UE use) Timed up and go (TUG): 14.00 sec  11/28/23: 5 times sit to stand: 11.67 sec (no UE use) Timed up and go (TUG): 9.34 sec  01/08/24: 5 times sit to stand: 9.42 sec (no UE use) TUG: 8.30 sec  GAIT: Distance walked: 50 feet Assistive device utilized: Single point cane Level of assistance: SBA Comments: antalgic/guarded/ slow                                                                                                                                TREATMENT DATE:  01/08/24 [facility] L6x6' PT present to discuss current status LEFS, 5x STS and TUG, MMT (see above) Chair sit up 6lb 2x10, then russian twists 5lb 2x10 600 feet ambulation no AD and no breaks - met LTG Leg press bil LE 80lb seat 8: 20x, then 75/25 Lt/Rt LE effort 1x8, 1x12 (fatigue) Lt foot forward stagger stance, Lt slight knee flexion for quad activation kickstand dead lift 10lb x8 (fatigue)  January 03, 2024 [facility] L6x6' PT present to discuss current status Seated red loop clam 2x10 Sit to stand from mat table + pad 2x10 Step-down: 6 step with balance pad under to reduce step height x10 Rt and Lt Pallof press: green band x10 bil Seated ER with green band 2x10  Standing shoulder extension: green band 2x10 Standing alternating tap up on cone x 20 with bil UE support Chair sit up 5lb 2x10, then russian twists 5lb 2x10 Resisted backward walking with belt 10lb x 7 reps, close supervision for  safety  12/25/23 [facility] L6x6' PT present to discuss current status Seated red loop clam 2x10 Sit to stand from mat table + pad x10 Step-down: 6 step with balance pad under to reduce step height x10 Rt and Lt Supine hooklying bridge 2x10 Seated ER with green band 2x10 Seated bil shoulder horiz abd green band  2x10 SL clam no resistance - very weak on Lt hip - PT provided proximal joint approximation at hip 2x10 Lateral band walks with yellow loop x 5 laps at back counter Standing alternating tap up on cone x 20 with bil UE support Chair sit up 5lb 2x10, then russian twists 5lb 2x10 Resisted backward walking with belt 10lb x 7 reps, close supervision for safety   ASSESSMENT:   CLINICAL IMPRESSION:  Pt met several LTGs and is making good progress toward remaining goals.  He continues to have signif weakness in Lt eccentric quad control which affects/limits descending stairs and squatting.  He demos less fall risk by significantly improving his TUG and 5x STS tests today.  He continues to improve in his LEFS score, from 50% function at eval to 64% function 01/08/24. Patient will continue to benefit from skilled PT to address remaining functional weakness surrounding Lt knee and focus his HEP.   PATIENT EDUCATION:  Education details: Initiated HEP Person educated: Patient Education method: Programmer, multimedia, Facilities manager, Verbal cues, and Handouts Education comprehension: verbalized understanding and returned demonstration  HOME EXERCISE PROGRAM: Access Code: [email or web address] URL: [email or web address] Date: 11/08/2023 Prepared by: Jarrell Menke  Exercises - Seated Hip Abduction with Resistance  - 1 x daily - 7 x weekly - 1 sets - 20 reps - Seated March with Resistance  - 1 x daily - 7 x weekly - 2 sets - 8 reps - Squat with Chair Touch  - 1 x daily - 7 x weekly - 4 sets - 5 reps - Side Stepping with Resistance at Ankles  - 1 x daily - 7 x weekly - 3 sets - 10 reps - Forward Step  Touch  - 2-3 x daily - 7 x weekly - 1 sets - 5-8 reps - Standing 3-way Hip with Walker  - 2-3 x daily - 7 x weekly - 1 sets - 5-8 reps    OBJECTIVE IMPAIRMENTS: Abnormal gait, cardiopulmonary status limiting activity, decreased activity tolerance, decreased balance, decreased endurance, difficulty walking, decreased ROM, decreased strength, increased fascial restrictions, increased muscle spasms, impaired flexibility, impaired UE functional use, improper body mechanics, postural dysfunction, and pain.   ACTIVITY LIMITATIONS: carrying, lifting, bending, sitting, standing, squatting, sleeping, stairs, transfers, bed mobility, bathing, toileting, dressing, reach over head, hygiene/grooming, and caring for others  PARTICIPATION LIMITATIONS: meal prep, cleaning, laundry, driving, shopping, community activity, occupation, yard work, and church  PERSONAL FACTORS: Fitness, Past/current experiences, Profession, Time since onset of injury/illness/exacerbation, and 3+ comorbidities: spine surgery, CABG, diabetes and Htn are also affecting patient's functional outcome.   REHAB POTENTIAL: Good  CLINICAL DECISION MAKING: Unstable/unpredictable  EVALUATION COMPLEXITY: High   GOALS: Goals reviewed with patient? Yes  SHORT TERM GOALS: Target date: 10/26/2023  Pain report to be no greater than 4/10  Baseline: Goal status: Met on 11/01/23  2.  Patient will be independent with initial HEP  Baseline:  Goal status: Met on  10/18/23  3.  Patient to be able to ambulate 50 feet without a.d Baseline:  Goal status: Met on 11/01/2023  4.  Patient to be able to do 10 sit to stand without UE support  Baseline:  Goal status: Met on 11/08/23    LONG TERM GOALS: Target date: 11/23/2023  Patient to report pain no greater than 2/10  Baseline:  Goal status: Progressing   2.  Patient to be independent with advanced HEP  Baseline:  Goal status: ongoing   3.  Patient to be able to stand or walk for at least 15  min without pain or need to rest Baseline: 1 hour (01/01/24) Goal status: MET  4.  Patient to be able to ascend and descend 12 steps safely Baseline: max UE support and reduced eccentric control on the Lt (12/25/23) Goal status: Ongoing for descending 01/08/24  5.  Patient to report 85% improvement in overall symptoms and function Baseline:  Goal status: Ongoing, 60-70% 01/08/24  6.  Patient to be able to walk 500 feet without a.d. safely without rest breaks Baseline:  Goal status: MET 01/08/24   PLAN:  PT FREQUENCY: 3x/week  PT DURATION: 8 weeks  PLANNED INTERVENTIONS: 97110-Therapeutic exercises, 97530- Therapeutic activity, 97112- Neuromuscular re-education, 97535- Self Care, 02859- Manual therapy, [phone]- Gait training, [phone]- Aquatic Therapy, [phone]- Electrical stimulation (unattended), [phone]- Electrical stimulation (manual), [id number]- Vasopneumatic device, 97035- Ultrasound, 02966- Ionotophoresis 4mg /ml Dexamethasone , Patient/Family education, Balance training, Stair training, Taping, Joint mobilization, Spinal mobilization, Cryotherapy, and Moist heat  PLAN FOR NEXT SESSION: ERO next time - all goals and measures updated last visit on 01/08/24, work on eccentric Lt quad strength in closed chain, endurance, strength, gait, standing   Orvil Fester, PT 01/08/24 3:42 PM     [facility] [address] [place], [place] [address] Phone # [phone] Fax [phone]

## 2024-01-09 ENCOUNTER — Other Ambulatory Visit (HOSPITAL_COMMUNITY): Payer: Self-pay

## 2024-01-09 ENCOUNTER — Other Ambulatory Visit: Payer: Self-pay | Admitting: Family Medicine

## 2024-01-09 ENCOUNTER — Encounter (HOSPITAL_COMMUNITY): Payer: Self-pay

## 2024-01-09 ENCOUNTER — Other Ambulatory Visit: Payer: Self-pay

## 2024-01-10 ENCOUNTER — Other Ambulatory Visit (HOSPITAL_COMMUNITY): Payer: Self-pay

## 2024-01-10 ENCOUNTER — Other Ambulatory Visit: Payer: Self-pay

## 2024-01-12 ENCOUNTER — Ambulatory Visit: Admitting: Physical Therapy

## 2024-01-15 ENCOUNTER — Ambulatory Visit

## 2024-01-15 DIAGNOSIS — M25552 Pain in left hip: Secondary | ICD-10-CM

## 2024-01-15 DIAGNOSIS — M6281 Muscle weakness (generalized): Secondary | ICD-10-CM

## 2024-01-15 DIAGNOSIS — M25652 Stiffness of left hip, not elsewhere classified: Secondary | ICD-10-CM

## 2024-01-15 DIAGNOSIS — R262 Difficulty in walking, not elsewhere classified: Secondary | ICD-10-CM

## 2024-01-15 DIAGNOSIS — R2681 Unsteadiness on feet: Secondary | ICD-10-CM

## 2024-01-15 NOTE — Therapy (Signed)
 OUTPATIENT PHYSICAL THERAPY TREATMENT NOTE   Patient Name: David [name] MRN: [id number] DOB:[birth year], 63 y.o., male Today's Date: 01/15/2024  END OF SESSION:  PT End of Session - 01/15/24 1209     Visit Number 20    Date for Recertification  03/11/24    Authorization Type Medicaid no auth 27 visits    Authorization - Visit Number 20    Authorization - Number of Visits 27    PT Start Time 1144    PT Stop Time 1223    PT Time Calculation (min) 39 min    Activity Tolerance Patient tolerated treatment well    Behavior During Therapy WFL for tasks assessed/performed                         Past Medical History:  Diagnosis Date   Cardiomyopathy (HCC)    a. EF 45% in 2019.   CHF (congestive heart failure) (HCC)    Chronic anticoagulation 09/30/2017   Diabetes mellitus type 2 in nonobese [facility])    Does not have health insurance    Essential hypertension 09/26/2017   Financial difficulties    H/O noncompliance with medical treatment, presenting hazards to health    Hardware complicating wound infection 09/25/2022   Non-insulin  treated type 2 diabetes mellitus (HCC) 09/26/2017   Persistent atrial fibrillation (HCC)    Sleep apnea suspected 09/30/2017   years ago- not anymore per patient   Past Surgical History:  Procedure Laterality Date   ABDOMINAL AORTOGRAM W/LOWER EXTREMITY N/A 09/02/2021   Procedure: ABDOMINAL AORTOGRAM W/LOWER EXTREMITY;  Surgeon: Serene Gaile ORN, MD;  [facility]: [facility];  Service: Cardiovascular;  Laterality: N/A;   ABDOMINAL AORTOGRAM W/LOWER EXTREMITY N/A 12/28/2021   Procedure: ABDOMINAL AORTOGRAM W/LOWER EXTREMITY;  Surgeon: Serene Gaile ORN, MD;  [facility]: [facility];  Service: Cardiovascular;  Laterality: N/A;   ABDOMINAL AORTOGRAM W/LOWER EXTREMITY N/A 07/28/2022   Procedure: ABDOMINAL AORTOGRAM W/LOWER EXTREMITY;  Surgeon: Gretta Lonni PARAS, MD;  [facility]: [facility];  Service: Cardiovascular;   Laterality: N/A;   AMPUTATION Right 09/04/2021   Procedure: 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;  Surgeon: Harden Jerona GAILS, MD;  Location: [facility] OR;  Service: Orthopedics;  Laterality: Right;   AMPUTATION Right 08/02/2022   Procedure: AMPUTATION RAY 5TH METATARSAL OF RIGHT FOOT;  Surgeon: Barton Drape, MD;  Location: [facility];  Service: Orthopedics;  Laterality: Right;   APPENDECTOMY  1971   BONE BIOPSY Right 08/02/2022   Procedure: BONE BIOPSY OF 5TH METATARSAL RIGHT FOOT;  Surgeon: Barton Drape, MD;  [facility]: [facility];  Service: Orthopedics;  Laterality: Right;   CARDIOVERSION N/A 09/28/2017   Procedure: CARDIOVERSION;  Surgeon: Francyne Headland, MD;  [facility];  Service: Cardiovascular;  Laterality: N/A;   CLIPPING OF ATRIAL APPENDAGE N/A 04/18/2023   Procedure: CLIPPING OF ATRIAL APPENDAGE USING ATRICURE ATRICLIP [id number] with pulmonary vein isolation ablation;  Surgeon: Lucas Dorise POUR, MD;  Location: [facility] OR;  Service: Open Heart Surgery;  Laterality: N/A;   CORONARY ARTERY BYPASS GRAFT N/A 04/18/2023   Procedure: CORONARY ARTERY BYPASS GRAFTING (CABG) TIMES FOUR USING LEFT INTERNAL MAMMARY ARTERY AND ENDOSCOPICALLY HARVESTED LEFT GREATER SAPHENOUS VEIN;  Surgeon: Lucas Dorise POUR, MD;  [facility];  Service: Open Heart Surgery;  Laterality: N/A;   IRRIGATION AND DEBRIDEMENT FOOT  08/02/2022   Procedure: IRRIGATION AND DEBRIDEMENT RIGHT 5TH METATARSAL FOOT;  Surgeon: Barton Drape, MD;  [facility];  Service: Orthopedics;;  LAMINECTOMY WITH POSTERIOR LATERAL ARTHRODESIS LEVEL 4 N/A 08/30/2022   Procedure: THORACIC FIVE-THORACIC ELEVEN FUSION, THORACIC EIGHT TRANSPEDICULAR DECOMPRESSION AND PARTIAL CORPECTOMY with O-Arm;  Surgeon: Debby Dorn MATSU, MD;  Location: [facility] OR;  Service: Neurosurgery;  Laterality: N/A;   LEFT HEART CATH AND CORONARY ANGIOGRAPHY N/A 04/12/2023   Procedure: LEFT HEART CATH AND CORONARY ANGIOGRAPHY;  Surgeon: Verlin Lonni BIRCH, MD;  [facility];  Service: Cardiovascular;  Laterality: N/A;   NASAL ENDOSCOPY Right 06/30/2023   Procedure: ENDOSCOPY, NOSE;  Surgeon: Jesus Oliphant, MD;  Location: [facility] OR;  Service: ENT;  Laterality: Right;   PERIPHERAL VASCULAR BALLOON ANGIOPLASTY  09/02/2021   Procedure: PERIPHERAL VASCULAR BALLOON ANGIOPLASTY;  Surgeon: Serene Gaile ORN, MD;  [facility];  Service: Cardiovascular;;   PERIPHERAL VASCULAR BALLOON ANGIOPLASTY  12/28/2021   Procedure: PERIPHERAL VASCULAR BALLOON ANGIOPLASTY;  Surgeon: Serene Gaile ORN, MD;  [facility];  Service: Cardiovascular;;  Posterior Tibial PTA only   POLYPECTOMY Right 06/30/2023   Procedure: POLYPECTOMY, NASAL CAVITY;  Surgeon: Jesus Oliphant, MD;  Location: [facility] OR;  Service: ENT;  Laterality: Right;   TEE WITHOUT CARDIOVERSION N/A 09/28/2017   Procedure: TRANSESOPHAGEAL ECHOCARDIOGRAM (TEE);  Surgeon: Francyne Headland, MD;  Location: [facility] ENDOSCOPY;  Service: Cardiovascular;  Laterality: N/A;   TEE WITHOUT CARDIOVERSION N/A 09/03/2021   Procedure: TRANSESOPHAGEAL ECHOCARDIOGRAM (TEE);  Surgeon: Raford Riggs, MD;  Location: [facility] ENDOSCOPY;  Service: Cardiovascular;  Laterality: N/A;   TEE WITHOUT CARDIOVERSION N/A 07/15/2022   Procedure: TRANSESOPHAGEAL ECHOCARDIOGRAM;  Surgeon: Francyne Headland, MD;  [facility];  Service: Cardiovascular;  Laterality: N/A;   TEE WITHOUT CARDIOVERSION N/A 04/18/2023   Procedure: TRANSESOPHAGEAL ECHOCARDIOGRAM (TEE);  Surgeon: Lucas Dorise POUR, MD;  Location: [facility] OR;  Service: Open Heart Surgery;  Laterality: N/A;   TOTAL HIP ARTHROPLASTY Left 07/14/2023   Procedure: ARTHROPLASTY, HIP, TOTAL, ANTERIOR APPROACH;  Surgeon: Kendal Franky SQUIBB, MD;  Location: [facility];  Service: Orthopedics;  Laterality: Left;   Patient Active Problem List   Diagnosis Date Noted   Left displaced femoral neck fracture (HCC) 07/13/2023   Closed left femoral fracture (HCC) 07/12/2023   History of CHF  (congestive heart failure) 07/12/2023   Chronic kidney disease (CKD), stage III (moderate) (HCC) 07/12/2023   Hyperlipidemia 07/12/2023   BPH (benign prostatic hyperplasia) 07/12/2023   Peripheral neuropathy 07/12/2023   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   Bradycardia 04/14/2023   NSTEMI (non-ST elevated myocardial infarction) (HCC) 04/13/2023   Chronic bilateral low back pain 04/13/2023   History of CABG in  04/2023 04/12/2023   Acute decompensated heart failure (HCC) 04/10/2023   Chronic pain syndrome 04/09/2023   MRSA infection greater than 3 months ago 04/09/2023   Hypoglycemia 10/06/2022   Pleural effusion 09/29/2022   Chronic systolic CHF (congestive heart failure) (HCC) 09/29/2022   Acute urinary retention 09/29/2022   Anxiety 09/29/2022   Hardware complicating wound infection 09/25/2022   Discitis of thoracic region 09/25/2022   Hypokalemia 09/07/2022   Acute toxic encephalopathy 09/07/2022   Acute postoperative anemia due to expected blood loss 09/07/2022   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   Adjustment disorder with mixed anxiety and depressed mood 08/13/2022   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 08/08/2022   MRSA bacteremia 08/08/2022   AKI (acute kidney injury), ruled out 07/27/2022   Discitis thoracic region 07/27/2022   Bacteremia due to methicillin resistant Staphylococcus aureus 07/27/2022   Severe protein-calorie malnutrition 07/26/2022   PVD (peripheral vascular disease)  07/26/2022   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   Bacteremia 07/15/2022   Atelectasis 07/12/2022   Sepsis due to pneumonia (HCC) 07/12/2022   Acute on chronic diastolic (congestive) heart failure (HCC) 07/12/2022   Gangrene of toe of right foot (HCC)    Sepsis (HCC) 08/29/2021   SIRS (systemic inflammatory response syndrome) (HCC) 08/29/2021   Hyponatremia 08/29/2021   Hypomagnesemia 08/29/2021   Pure hypercholesterolemia 06/17/2021   Pain of left hand 09/06/2019   Cellulitis 08/26/2019    Persistent atrial fibrillation (HCC) 08/26/2019   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) 10/18/2018   Transaminitis 10/17/2018   Hyperbilirubinemia 10/17/2018   Leukocytosis 10/17/2018   Sinusitis 10/17/2018   Chronic anticoagulation 09/30/2017   Smoker 09/30/2017   Cardiomyopathy (HCC) 09/30/2017   Sleep apnea suspected 09/30/2017   Essential hypertension 09/26/2017   Insulin  dependent type 2 diabetes mellitus (HCC) 09/26/2017   Permanent atrial fibrillation (HCC) 09/26/2017    PCP: Delbert Clam, MD  REFERRING PROVIDER: Kendal Franky SQUIBB, MD  REFERRING DIAG: [phone] (ICD-10-CM) - Status post total hip replacement, left  THERAPY DIAG:  Stiffness of left hip, not elsewhere classified - Plan: PT plan of care cert/re-cert  Muscle weakness (generalized) - Plan: PT plan of care cert/re-cert  Difficulty in walking, not elsewhere classified - Plan: PT plan of care cert/re-cert  Pain in left hip - Plan: PT plan of care cert/re-cert  Unsteadiness on feet - Plan: PT plan of care cert/re-cert  Rationale for Evaluation and Treatment: Rehabilitation  ONSET DATE: 09/26/2023  SUBJECTIVE:   SUBJECTIVE STATEMENT:  I am making progress and there is more progress that I want to make.  I need to get stronger in my legs and my back still hurts.    EVAL: Patient had series of orthopedic issues. He was in PT for back pain when he was found to have MRSA infection in spine May 2024, surgery followed for fusion/reconstruction, January had CABG, was still on cane once he got home and fell in bathroom and suffered hip fracture on left with subsequent THA. His THA was on 07/14/23. He was in rehab for several weeks and is now home. He felt he did not get aggressive enough PT in rehab. He then had home health and only did this 1 time per week. He did his exercises and is very motivated to get his function back. He is unsure if he will return to work. He is concerned about the  physicality of the job with all that he has endured. He states this all depends on how much function he gets back. His goal is to be able to walk without the cane and build some upper body strength to be able to function at the level he did before and do his job.   PERTINENT HISTORY: Left LE neural impairment since back surgery; right toe amputations See above for extensive medical history  PAIN: 01/15/24 Are you having pain? Yes: NPRS scale: 5/10 Pain location: right hip and left low back Pain description: aching / stiff Aggravating factors: standing > 20 min, weather changes  Relieving factors: moving  PRECAUTIONS: Fall  RED FLAGS: None   WEIGHT BEARING RESTRICTIONS: No  FALLS:  Has patient fallen in last 6 months? Yes. Number of falls 1  LIVING ENVIRONMENT: Lives with: lives with their spouse Lives in: House/apartment Stairs: No Has following equipment at home: Single point cane  OCCUPATION: chef  PLOF: Independent, Independent with basic ADLs, Independent with household mobility without device, Independent with community  mobility without device, Independent with homemaking with ambulation, Independent with gait, and Independent with transfers  PATIENT GOALS: To be able to walk without the cane and build some upper body strength to be able to function at the level he did before and do his job.    NEXT MD VISIT: 2 months  OBJECTIVE:  Note: Objective measures were completed at Evaluation unless otherwise noted.  DIAGNOSTIC FINDINGS: na  PATIENT SURVEYS:  Eval:  LEFS : 30/80= 50% 11/08/2023:  Lower Extremity Functional Score: 26 / 80 = 32.5 % 11/28/2023:  Lower Extremity Functional Score: 38 / 80 = 47.5 % 01/08/24: 51/80 = 64%  COGNITION: Overall cognitive status: Within functional limits for tasks assessed     SENSATION: WFL   MUSCLE LENGTH: Hips tight bilaterally in hip flexors, quads and rotation both IR and ER   Left > Right  POSTURE: rounded shoulders,  forward head, decreased lumbar lordosis, posterior pelvic tilt, and flexed trunk    LOWER EXTREMITY ROM:  WFL  LOWER EXTREMITY MMT:  MMT Right eval Left eval Left 11/28/23 Left 01/08/24  Hip flexion  4 4+ 4+  Hip extension  4- 4 4+  Hip abduction  4 4+ 4+  Hip adduction  4 4+ 5  Hip internal rotation  4 4 4+  Hip external rotation  4 4+ 4+  Knee flexion  4 4+ 5  Knee extension  4+ 4+ Unable to control quad eccentrically for descending stairs, open chain testing: 4+/5  Ankle dorsiflexion      Ankle plantarflexion      Ankle inversion      Ankle eversion       (Blank rows = not tested)   FUNCTIONAL TESTS:  Eval: 5 times sit to stand: 15.14 sec (no UE use) Timed up and go (TUG): 14.00 sec  11/28/23: 5 times sit to stand: 11.67 sec (no UE use) Timed up and go (TUG): 9.34 sec  01/08/24: 5 times sit to stand: 9.42 sec (no UE use) TUG: 8.30 sec  01/15/24: 3 min walk test: 442 feet (age related norm for 6 min is 2009 feet)    GAIT: Distance walked: 50 feet Assistive device utilized: Single point cane Level of assistance: SBA Comments: antalgic/guarded/ slow                                                                                                                                TREATMENT DATE:  01/15/24 [facility] L6x6' PT present to discuss current status Chair sit up 6lb 2x10, then russian twists 5lb 2x10 3 min walk test: 442 feet  Leg press bil LE 80lb seat 8: 2x20, Lt only 50#, Rt only 70#x10 each  Resisted walking 10# in reverse x10 Session ended early due to pt report of fatigue from lack of sleep last night.   01/08/24 [facility] L6x6' PT present to discuss current status LEFS, 5x STS and TUG, MMT (see above) Chair sit  up 6lb 2x10, then russian twists 5lb 2x10 600 feet ambulation no AD and no breaks - met LTG Leg press bil LE 80lb seat 8: 20x, then 75/25 Lt/Rt LE effort 1x8, 1x12 (fatigue) Lt foot forward stagger stance, Lt slight knee flexion for quad  activation kickstand dead lift 10lb x8 (fatigue)  Jan 04, 2024 [facility]Step L6x6' PT present to discuss current status Seated red loop clam 2x10 Sit to stand from mat table + pad 2x10 Step-down: 6 step with balance pad under to reduce step height x10 Rt and Lt Pallof press: green band x10 bil Seated ER with green band 2x10 Standing shoulder extension: green band 2x10 Standing alternating tap up on cone x 20 with bil UE support Chair sit up 5lb 2x10, then russian twists 5lb 2x10 Resisted backward walking with belt 10lb x 7 reps, close supervision for safety   ASSESSMENT:   CLINICAL IMPRESSION:  Pt met several LTGs and is making good progress toward remaining goals.  He continues to have weakness in Lt eccentric quad control which affects/limits descending stairs and squatting.  He demos less fall risk by significantly improving his TUG and 5x STS tests last visit.  He continues to improve in his LEFS score, from 50% function at eval to 64% function last visit. Pt has 7 visits remaining on his insurance verification for the year and he will use these to improve his overall strength, mobility and function.  3 min walk test was 442 feet today, below norm for his age. Patient will continue to benefit from skilled PT to address remaining functional weakness surrounding Lt knee and focus his HEP.   PATIENT EDUCATION:  Education details: Initiated HEP Person educated: Patient Education method: Programmer, multimedia, Facilities manager, Verbal cues, and Handouts Education comprehension: verbalized understanding and returned demonstration  HOME EXERCISE PROGRAM: Access Code: [email or web address] URL: [email or web address] Date: 11/08/2023 Prepared by: Jarrell Menke  Exercises - Seated Hip Abduction with Resistance  - 1 x daily - 7 x weekly - 1 sets - 20 reps - Seated March with Resistance  - 1 x daily - 7 x weekly - 2 sets - 8 reps - Squat with Chair Touch  - 1 x daily - 7 x weekly - 4 sets - 5 reps - Side  Stepping with Resistance at Ankles  - 1 x daily - 7 x weekly - 3 sets - 10 reps - Forward Step Touch  - 2-3 x daily - 7 x weekly - 1 sets - 5-8 reps - Standing 3-way Hip with Walker  - 2-3 x daily - 7 x weekly - 1 sets - 5-8 reps    OBJECTIVE IMPAIRMENTS: Abnormal gait, cardiopulmonary status limiting activity, decreased activity tolerance, decreased balance, decreased endurance, difficulty walking, decreased ROM, decreased strength, increased fascial restrictions, increased muscle spasms, impaired flexibility, impaired UE functional use, improper body mechanics, postural dysfunction, and pain.   ACTIVITY LIMITATIONS: carrying, lifting, bending, sitting, standing, squatting, sleeping, stairs, transfers, bed mobility, bathing, toileting, dressing, reach over head, hygiene/grooming, and caring for others  PARTICIPATION LIMITATIONS: meal prep, cleaning, laundry, driving, shopping, community activity, occupation, yard work, and church  PERSONAL FACTORS: Fitness, Past/current experiences, Profession, Time since onset of injury/illness/exacerbation, and 3+ comorbidities: spine surgery, CABG, diabetes and Htn are also affecting patient's functional outcome.   REHAB POTENTIAL: Good  CLINICAL DECISION MAKING: Unstable/unpredictable  EVALUATION COMPLEXITY: High   GOALS: Goals reviewed with patient? Yes  SHORT TERM GOALS: Target date: 10/26/2023  Pain report to be no greater than 4/10  Baseline: Goal status:  Met on 11/01/23  2.  Patient will be independent with initial HEP  Baseline:  Goal status: Met on 10/18/23  3.  Patient to be able to ambulate 50 feet without a.d Baseline:  Goal status: Met on 11/01/2023  4.  Patient to be able to do 10 sit to stand without UE support  Baseline:  Goal status: Met on 11/08/23    LONG TERM GOALS: Target date: 03/11/2024   Patient to report pain no greater than 2/10  Baseline:  Goal status: Progressing   2.  Patient to be independent with advanced  HEP  Baseline:  Goal status: ongoing   3.  Patient to be able to stand or walk for at least 15 min without pain or need to rest Baseline: 1 hour (01/01/24) Goal status: MET  4.  Patient to be able to ascend and descend 12 steps safely Baseline: max UE support and reduced eccentric control on the Lt (12/25/23) Goal status: Ongoing for descending 01/08/24  5.  Patient to report 85% improvement in overall symptoms and function Baseline:  Goal status: Ongoing, 60-70% 01/08/24  6.  Ambulate > or = to 650 feet in 3 minutes to improve community function  Baseline: 442 feet Goal status:  NEW   PLAN:  PT FREQUENCY: 1-2x/week  PT DURATION: 8 weeks- 7 visit remain on insurance  PLANNED INTERVENTIONS: 97110-Therapeutic exercises, 97530- Therapeutic activity, 97112- Neuromuscular re-education, [phone]- Self Care, 02859- Manual therapy, [phone]- Gait training, [phone]- Aquatic Therapy, [phone]- Electrical stimulation (unattended), [phone]- Electrical stimulation (manual), [id number]- Vasopneumatic device, 97035- Ultrasound, [id number]- Ionotophoresis 4mg /ml Dexamethasone , Patient/Family education, Balance training, Stair training, Taping, Joint mobilization, Spinal mobilization, Cryotherapy, and Moist heat  PLAN FOR NEXT SESSION:  work on eccentric Lt quad strength in closed chain, endurance, strength, gait, standing, try hip matrix   Burnard Joy, PT 01/15/24 12:29 PM      [facility] [address], [address] [place], [place] [address] Phone # [phone] Fax [phone]

## 2024-01-19 ENCOUNTER — Ambulatory Visit: Admitting: Physical Therapy

## 2024-01-22 ENCOUNTER — Ambulatory Visit

## 2024-01-24 ENCOUNTER — Ambulatory Visit: Admitting: Family Medicine

## 2024-01-29 ENCOUNTER — Ambulatory Visit: Admitting: Rehabilitative and Restorative Service Providers"

## 2024-01-31 ENCOUNTER — Other Ambulatory Visit (HOSPITAL_COMMUNITY): Payer: Self-pay

## 2024-01-31 ENCOUNTER — Other Ambulatory Visit: Payer: Self-pay

## 2024-02-01 ENCOUNTER — Other Ambulatory Visit (HOSPITAL_COMMUNITY): Payer: Self-pay

## 2024-02-01 ENCOUNTER — Other Ambulatory Visit: Payer: Self-pay | Admitting: Family Medicine

## 2024-02-02 ENCOUNTER — Other Ambulatory Visit: Payer: Self-pay

## 2024-02-02 ENCOUNTER — Other Ambulatory Visit (HOSPITAL_COMMUNITY): Payer: Self-pay

## 2024-02-02 ENCOUNTER — Ambulatory Visit: Admitting: Rehabilitative and Restorative Service Providers"

## 2024-02-02 MED ORDER — POTASSIUM CHLORIDE ER 20 MEQ PO TBCR
20.0000 meq | EXTENDED_RELEASE_TABLET | Freq: Every day | ORAL | 1 refills | Status: DC
  Filled 2024-02-02 – 2024-02-05 (×2): qty 30, 30d supply, fill #0
  Filled 2024-02-12: qty 30, 30d supply, fill #1

## 2024-02-02 MED ORDER — DAPAGLIFLOZIN PROPANEDIOL 10 MG PO TABS
10.0000 mg | ORAL_TABLET | Freq: Every day | ORAL | 1 refills | Status: DC
  Filled 2024-02-02 – 2024-02-05 (×3): qty 30, 30d supply, fill #0
  Filled 2024-03-06 – 2024-03-20 (×2): qty 30, 30d supply, fill #1

## 2024-02-05 ENCOUNTER — Other Ambulatory Visit: Payer: Self-pay

## 2024-02-05 ENCOUNTER — Other Ambulatory Visit (HOSPITAL_COMMUNITY): Payer: Self-pay

## 2024-02-05 ENCOUNTER — Ambulatory Visit: Attending: Student | Admitting: Physical Therapy

## 2024-02-05 ENCOUNTER — Encounter: Payer: Self-pay | Admitting: Physical Therapy

## 2024-02-05 DIAGNOSIS — R262 Difficulty in walking, not elsewhere classified: Secondary | ICD-10-CM | POA: Insufficient documentation

## 2024-02-05 DIAGNOSIS — M25552 Pain in left hip: Secondary | ICD-10-CM | POA: Insufficient documentation

## 2024-02-05 DIAGNOSIS — M25652 Stiffness of left hip, not elsewhere classified: Secondary | ICD-10-CM | POA: Diagnosis present

## 2024-02-05 DIAGNOSIS — M6281 Muscle weakness (generalized): Secondary | ICD-10-CM | POA: Diagnosis present

## 2024-02-05 DIAGNOSIS — R2681 Unsteadiness on feet: Secondary | ICD-10-CM | POA: Insufficient documentation

## 2024-02-05 DIAGNOSIS — R293 Abnormal posture: Secondary | ICD-10-CM | POA: Diagnosis present

## 2024-02-05 NOTE — Therapy (Signed)
 OUTPATIENT PHYSICAL THERAPY TREATMENT NOTE   Patient Name: David [name] MRN: [id number] DOB:22-Feb-1961, 63 y.o., male Today's Date: 02/05/2024  END OF SESSION:  PT End of Session - 02/05/24 1445     Visit Number 21    Number of Visits 27    Date for Recertification  03/11/24    Authorization Type Medicaid no auth 27 visits    Authorization - Visit Number 21    Authorization - Number of Visits 27    PT Start Time 1445    PT Stop Time 1528    PT Time Calculation (min) 43 min    Activity Tolerance Patient tolerated treatment well    Behavior During Therapy WFL for tasks assessed/performed                          Past Medical History:  Diagnosis Date   Cardiomyopathy (HCC)    a. EF 45% in 2019.   CHF (congestive heart failure) (HCC)    Chronic anticoagulation 09/30/2017   Diabetes mellitus type 2 in nonobese [facility])    Does not have health insurance    Essential hypertension 09/26/2017   Financial difficulties    H/O noncompliance with medical treatment, presenting hazards to health    Hardware complicating wound infection 09/25/2022   Non-insulin  treated type 2 diabetes mellitus (HCC) 09/26/2017   Persistent atrial fibrillation (HCC)    Sleep apnea suspected 09/30/2017   years ago- not anymore per patient   Past Surgical History:  Procedure Laterality Date   ABDOMINAL AORTOGRAM W/LOWER EXTREMITY N/A 09/02/2021   Procedure: ABDOMINAL AORTOGRAM W/LOWER EXTREMITY;  Surgeon: Serene Gaile ORN, MD;  [facility];  Service: Cardiovascular;  Laterality: N/A;   ABDOMINAL AORTOGRAM W/LOWER EXTREMITY N/A 12/28/2021   Procedure: ABDOMINAL AORTOGRAM W/LOWER EXTREMITY;  Surgeon: Serene Gaile ORN, MD;  [facility]: [facility];  Service: Cardiovascular;  Laterality: N/A;   ABDOMINAL AORTOGRAM W/LOWER EXTREMITY N/A 07/28/2022   Procedure: ABDOMINAL AORTOGRAM W/LOWER EXTREMITY;  Surgeon: Gretta Lonni PARAS, MD;  [facility];   Service: Cardiovascular;  Laterality: N/A;   AMPUTATION Right 09/04/2021   Procedure: 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;  Surgeon: Harden Jerona GAILS, MD;  Location: [facility] OR;  Service: Orthopedics;  Laterality: Right;   AMPUTATION Right 08/02/2022   Procedure: AMPUTATION RAY 5TH METATARSAL OF RIGHT FOOT;  Surgeon: Barton Drape, MD;  Location: [facility];  Service: Orthopedics;  Laterality: Right;   APPENDECTOMY  1971   BONE BIOPSY Right 08/02/2022   Procedure: BONE BIOPSY OF 5TH METATARSAL RIGHT FOOT;  Surgeon: Barton Drape, MD;  [facility];  Service: Orthopedics;  Laterality: Right;   CARDIOVERSION N/A 09/28/2017   Procedure: CARDIOVERSION;  Surgeon: Francyne Headland, MD;  [facility];  Service: Cardiovascular;  Laterality: N/A;   CLIPPING OF ATRIAL APPENDAGE N/A 04/18/2023   Procedure: CLIPPING OF ATRIAL APPENDAGE USING ATRICURE ATRICLIP [id number] with pulmonary vein isolation ablation;  Surgeon: Lucas Dorise POUR, MD;  [facility];  Service: Open Heart Surgery;  Laterality: N/A;   CORONARY ARTERY BYPASS GRAFT N/A 04/18/2023   Procedure: CORONARY ARTERY BYPASS GRAFTING (CABG) TIMES FOUR USING LEFT INTERNAL MAMMARY ARTERY AND ENDOSCOPICALLY HARVESTED LEFT GREATER SAPHENOUS VEIN;  Surgeon: Lucas Dorise POUR, MD;  [facility];  Service: Open Heart Surgery;  Laterality: N/A;   IRRIGATION AND DEBRIDEMENT FOOT  08/02/2022   Procedure: IRRIGATION AND DEBRIDEMENT RIGHT 5TH METATARSAL FOOT;  Surgeon: Barton Drape, MD;  [facility];  Service: Orthopedics;;   LAMINECTOMY WITH POSTERIOR LATERAL ARTHRODESIS LEVEL 4 N/A 08/30/2022   Procedure: THORACIC FIVE-THORACIC ELEVEN FUSION, THORACIC EIGHT TRANSPEDICULAR DECOMPRESSION AND PARTIAL CORPECTOMY with O-Arm;  Surgeon: Debby Dorn MATSU, MD;  Location: [facility] OR;  Service: Neurosurgery;  Laterality: N/A;   LEFT HEART CATH AND CORONARY ANGIOGRAPHY N/A 04/12/2023   Procedure: LEFT HEART CATH AND CORONARY ANGIOGRAPHY;  Surgeon: Verlin Lonni BIRCH,  MD;  [facility];  Service: Cardiovascular;  Laterality: N/A;   NASAL ENDOSCOPY Right 06/30/2023   Procedure: ENDOSCOPY, NOSE;  Surgeon: Jesus Oliphant, MD;  Location: [facility] OR;  Service: ENT;  Laterality: Right;   PERIPHERAL VASCULAR BALLOON ANGIOPLASTY  09/02/2021   Procedure: PERIPHERAL VASCULAR BALLOON ANGIOPLASTY;  Surgeon: Serene Gaile ORN, MD;  [facility];  Service: Cardiovascular;;   PERIPHERAL VASCULAR BALLOON ANGIOPLASTY  12/28/2021   Procedure: PERIPHERAL VASCULAR BALLOON ANGIOPLASTY;  Surgeon: Serene Gaile ORN, MD;  [facility];  Service: Cardiovascular;;  Posterior Tibial PTA only   POLYPECTOMY Right 06/30/2023   Procedure: POLYPECTOMY, NASAL CAVITY;  Surgeon: Jesus Oliphant, MD;  Location[facility];  Service: ENT;  Laterality: Right;   TEE WITHOUT CARDIOVERSION N/A 09/28/2017   Procedure: TRANSESOPHAGEAL ECHOCARDIOGRAM (TEE);  Surgeon: Francyne Headland, MD;  Location: [facility] ENDOSCOPY;  Service: Cardiovascular;  Laterality: N/A;   TEE WITHOUT CARDIOVERSION N/A 09/03/2021   Procedure: TRANSESOPHAGEAL ECHOCARDIOGRAM (TEE);  Surgeon: Raford Riggs, MD;  Location: [facility] ENDOSCOPY;  Service: Cardiovascular;  Laterality: N/A;   TEE WITHOUT CARDIOVERSION N/A 07/15/2022   Procedure: TRANSESOPHAGEAL ECHOCARDIOGRAM;  Surgeon: Francyne Headland, MD;  [facility];  Service: Cardiovascular;  Laterality: N/A;   TEE WITHOUT CARDIOVERSION N/A 04/18/2023   Procedure: TRANSESOPHAGEAL ECHOCARDIOGRAM (TEE);  Surgeon: Lucas Dorise POUR, MD;  Location: [facility] OR;  Service: Open Heart Surgery;  Laterality: N/A;   TOTAL HIP ARTHROPLASTY Left 07/14/2023   Procedure: ARTHROPLASTY, HIP, TOTAL, ANTERIOR APPROACH;  Surgeon: Kendal Franky SQUIBB, MD;  [facility];  Service: Orthopedics;  Laterality: Left;   Patient Active Problem List   Diagnosis Date Noted   Left displaced femoral neck fracture (HCC) 07/13/2023   Closed left femoral fracture (HCC) 07/12/2023    History of CHF (congestive heart failure) 07/12/2023   Chronic kidney disease (CKD), stage III (moderate) (HCC) 07/12/2023   Hyperlipidemia 07/12/2023   BPH (benign prostatic hyperplasia) 07/12/2023   Peripheral neuropathy 07/12/2023   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   Bradycardia 04/14/2023   NSTEMI (non-ST elevated myocardial infarction) (HCC) 04/13/2023   Chronic bilateral low back pain 04/13/2023   History of CABG in  04/2023 04/12/2023   Acute decompensated heart failure (HCC) 04/10/2023   Chronic pain syndrome 04/09/2023   MRSA infection greater than 3 months ago 04/09/2023   Hypoglycemia 10/06/2022   Pleural effusion 09/29/2022   Chronic systolic CHF (congestive heart failure) (HCC) 09/29/2022   Acute urinary retention 09/29/2022   Anxiety 09/29/2022   Hardware complicating wound infection 09/25/2022   Discitis of thoracic region 09/25/2022   Hypokalemia 09/07/2022   Acute toxic encephalopathy 09/07/2022   Acute postoperative anemia due to expected blood loss 09/07/2022   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   Adjustment disorder with mixed anxiety and depressed mood 08/13/2022   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 08/08/2022   MRSA bacteremia 08/08/2022   AKI (acute kidney injury), ruled out 07/27/2022   Discitis thoracic region 07/27/2022   Bacteremia due to methicillin resistant Staphylococcus aureus 07/27/2022   Severe protein-calorie  malnutrition 07/26/2022   PVD (peripheral vascular disease) 07/26/2022   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   Bacteremia 07/15/2022   Atelectasis 07/12/2022   Sepsis due to pneumonia (HCC) 07/12/2022   Acute on chronic diastolic (congestive) heart failure (HCC) 07/12/2022   Gangrene of toe of right foot (HCC)    Sepsis (HCC) 08/29/2021   SIRS (systemic inflammatory response syndrome) (HCC) 08/29/2021   Hyponatremia 08/29/2021   Hypomagnesemia 08/29/2021   Pure hypercholesterolemia 06/17/2021   Pain of left hand 09/06/2019   Cellulitis  08/26/2019   Persistent atrial fibrillation (HCC) 08/26/2019   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) 10/18/2018   Transaminitis 10/17/2018   Hyperbilirubinemia 10/17/2018   Leukocytosis 10/17/2018   Sinusitis 10/17/2018   Chronic anticoagulation 09/30/2017   Smoker 09/30/2017   Cardiomyopathy (HCC) 09/30/2017   Sleep apnea suspected 09/30/2017   Essential hypertension 09/26/2017   Insulin  dependent type 2 diabetes mellitus (HCC) 09/26/2017   Permanent atrial fibrillation (HCC) 09/26/2017    PCP: Delbert Clam, MD  REFERRING PROVIDER: Kendal Franky SQUIBB, MD  REFERRING DIAG: [phone] (ICD-10-CM) - Status post total hip replacement, left  THERAPY DIAG:  Stiffness of left hip, not elsewhere classified  Muscle weakness (generalized)  Difficulty in walking, not elsewhere classified  Pain in left hip  Unsteadiness on feet  Abnormal posture  Rationale for Evaluation and Treatment: Rehabilitation  ONSET DATE: 09/26/2023  SUBJECTIVE:   SUBJECTIVE STATEMENT:  I feel better the more I move. I can sleep weird or have a big weather change and that will drive some of my pain.  EVAL: Patient had series of orthopedic issues. He was in PT for back pain when he was found to have MRSA infection in spine May 2024, surgery followed for fusion/reconstruction, January had CABG, was still on cane once he got home and fell in bathroom and suffered hip fracture on left with subsequent THA. His THA was on 07/14/23. He was in rehab for several weeks and is now home. He felt he did not get aggressive enough PT in rehab. He then had home health and only did this 1 time per week. He did his exercises and is very motivated to get his function back. He is unsure if he will return to work. He is concerned about the physicality of the job with all that he has endured. He states this all depends on how much function he gets back. His goal is to be able to walk without the cane and build  some upper body strength to be able to function at the level he did before and do his job.   PERTINENT HISTORY: Left LE neural impairment since back surgery; right toe amputations See above for extensive medical history  PAIN: 01/15/24 Are you having pain? Yes: NPRS scale: 5/10 Pain location: right hip and left low back Pain description: aching / stiff Aggravating factors: standing > 20 min, weather changes  Relieving factors: moving  PRECAUTIONS: Fall  RED FLAGS: None   WEIGHT BEARING RESTRICTIONS: No  FALLS:  Has patient fallen in last 6 months? Yes. Number of falls 1  LIVING ENVIRONMENT: Lives with: lives with their spouse Lives in: House/apartment Stairs: No Has following equipment at home: Single point cane  OCCUPATION: chef  PLOF: Independent, Independent with basic ADLs, Independent with household mobility without device, Independent with community mobility without device, Independent with homemaking with ambulation, Independent with gait, and Independent with transfers  PATIENT GOALS: To be able to walk without the cane and build some  upper body strength to be able to function at the level he did before and do his job.    NEXT MD VISIT: 2 months  OBJECTIVE:  Note: Objective measures were completed at Evaluation unless otherwise noted.  DIAGNOSTIC FINDINGS: na  PATIENT SURVEYS:  Eval:  LEFS : 30/80= 50% 11/08/2023:  Lower Extremity Functional Score: 26 / 80 = 32.5 % 11/28/2023:  Lower Extremity Functional Score: 38 / 80 = 47.5 % 01/08/24: 51/80 = 64%  COGNITION: Overall cognitive status: Within functional limits for tasks assessed     SENSATION: WFL   MUSCLE LENGTH: Hips tight bilaterally in hip flexors, quads and rotation both IR and ER   Left > Right  POSTURE: rounded shoulders, forward head, decreased lumbar lordosis, posterior pelvic tilt, and flexed trunk    LOWER EXTREMITY ROM:  WFL  LOWER EXTREMITY MMT:  MMT Right eval Left eval  Left 11/28/23 Left 01/08/24  Hip flexion  4 4+ 4+  Hip extension  4- 4 4+  Hip abduction  4 4+ 4+  Hip adduction  4 4+ 5  Hip internal rotation  4 4 4+  Hip external rotation  4 4+ 4+  Knee flexion  4 4+ 5  Knee extension  4+ 4+ Unable to control quad eccentrically for descending stairs, open chain testing: 4+/5  Ankle dorsiflexion      Ankle plantarflexion      Ankle inversion      Ankle eversion       (Blank rows = not tested)   FUNCTIONAL TESTS:  Eval: 5 times sit to stand: 15.14 sec (no UE use) Timed up and go (TUG): 14.00 sec  11/28/23: 5 times sit to stand: 11.67 sec (no UE use) Timed up and go (TUG): 9.34 sec  01/08/24: 5 times sit to stand: 9.42 sec (no UE use) TUG: 8.30 sec  01/15/24: 3 min walk test: 442 feet (age related norm for 6 min is 2009 feet)    GAIT: Distance walked: 50 feet Assistive device utilized: Single point cane Level of assistance: SBA Comments: antalgic/guarded/ slow                                                                                                                                TREATMENT DATE:  02/05/24 NuStep L6x6' PT present to discuss current status Chair sit up 7lb 2x10, then russian twists 7lb 2x10 Sit to stand chair + pad holding 7lb x10 Reverse walking x8 10lb holding cable pulleys - this challenged his balance more so close supervision but no LOB with all reps Leg press bil LE 80lb seat 8: 2x20, Lt only 50# x10, Rt only 70#x15 each  Lateral step over hurdle tap and back x10 bil UE support Green tband bil shoulder ext x20 Blue tband bil shoulder row x20   01/15/24 NuStep L6x6' PT present to discuss current status Chair sit up 6lb 2x10, then russian twists 5lb 2x10 3 min  walk test: 442 feet  Leg press bil LE 80lb seat 8: 2x20, Lt only 50#, Rt only 70#x10 each  Resisted walking 10# in reverse x10 Session ended early due to pt report of fatigue from lack of sleep last night.   01/08/24 NuStep L6x6' PT present to  discuss current status LEFS, 5x STS and TUG, MMT (see above) Chair sit up 6lb 2x10, then russian twists 5lb 2x10 600 feet ambulation no AD and no breaks - met LTG Leg press bil LE 80lb seat 8: 20x, then 75/25 Lt/Rt LE effort 1x8, 1x12 (fatigue) Lt foot forward stagger stance, Lt slight knee flexion for quad activation kickstand dead lift 10lb x8 (fatigue)  Jan 13, 2024 NuStep L6x6' PT present to discuss current status Seated red loop clam 2x10 Sit to stand from mat table + pad 2x10 Step-down: 6 step with balance pad under to reduce step height x10 Rt and Lt Pallof press: green band x10 bil Seated ER with green band 2x10 Standing shoulder extension: green band 2x10 Standing alternating tap up on cone x 20 with bil UE support Chair sit up 5lb 2x10, then russian twists 5lb 2x10 Resisted backward walking with belt 10lb x 7 reps, close supervision for safety   ASSESSMENT:   CLINICAL IMPRESSION:  Pt has an excellent work rate and is always determined to push himself.  He ambulates without the cane most of the session. He continues to require bil UE handhold with balance challenges requiring him to briefly be on single LE.  We used cable pulleys vs belt for resisted walking which advanced his balance challenge today so PT provided close supervision but he had no LOB with all reps  PATIENT EDUCATION:  Education details: Initiated HEP Person educated: Patient Education method: Programmer, Multimedia, Demonstration, Verbal cues, and Handouts Education comprehension: verbalized understanding and returned demonstration  HOME EXERCISE PROGRAM: Access Code: [email or web address] URL: [email or web address] Date: 11/08/2023 Prepared by: Jarrell Menke  Exercises - Seated Hip Abduction with Resistance  - 1 x daily - 7 x weekly - 1 sets - 20 reps - Seated March with Resistance  - 1 x daily - 7 x weekly - 2 sets - 8 reps - Squat with Chair Touch  - 1 x daily - 7 x weekly - 4 sets - 5 reps - Side Stepping with  Resistance at Ankles  - 1 x daily - 7 x weekly - 3 sets - 10 reps - Forward Step Touch  - 2-3 x daily - 7 x weekly - 1 sets - 5-8 reps - Standing 3-way Hip with Walker  - 2-3 x daily - 7 x weekly - 1 sets - 5-8 reps    OBJECTIVE IMPAIRMENTS: Abnormal gait, cardiopulmonary status limiting activity, decreased activity tolerance, decreased balance, decreased endurance, difficulty walking, decreased ROM, decreased strength, increased fascial restrictions, increased muscle spasms, impaired flexibility, impaired UE functional use, improper body mechanics, postural dysfunction, and pain.   ACTIVITY LIMITATIONS: carrying, lifting, bending, sitting, standing, squatting, sleeping, stairs, transfers, bed mobility, bathing, toileting, dressing, reach over head, hygiene/grooming, and caring for others  PARTICIPATION LIMITATIONS: meal prep, cleaning, laundry, driving, shopping, community activity, occupation, yard work, and church  PERSONAL FACTORS: Fitness, Past/current experiences, Profession, Time since onset of injury/illness/exacerbation, and 3+ comorbidities: spine surgery, CABG, diabetes and Htn are also affecting patient's functional outcome.   REHAB POTENTIAL: Good  CLINICAL DECISION MAKING: Unstable/unpredictable  EVALUATION COMPLEXITY: High   GOALS: Goals reviewed with patient? Yes  SHORT TERM GOALS: Target date: 10/26/2023  Pain report to be  no greater than 4/10  Baseline: Goal status: Met on 11/01/23  2.  Patient will be independent with initial HEP  Baseline:  Goal status: Met on 10/18/23  3.  Patient to be able to ambulate 50 feet without a.d Baseline:  Goal status: Met on 11/01/2023  4.  Patient to be able to do 10 sit to stand without UE support  Baseline:  Goal status: Met on 11/08/23    LONG TERM GOALS: Target date: 03/11/2024   Patient to report pain no greater than 2/10  Baseline:  Goal status: Progressing   2.  Patient to be independent with advanced HEP   Baseline:  Goal status: ongoing   3.  Patient to be able to stand or walk for at least 15 min without pain or need to rest Baseline: 1 hour (01/01/24) Goal status: MET  4.  Patient to be able to ascend and descend 12 steps safely Baseline: max UE support and reduced eccentric control on the Lt (12/25/23) Goal status: Ongoing for descending 01/08/24  5.  Patient to report 85% improvement in overall symptoms and function Baseline:  Goal status: Ongoing, 60-70% 01/08/24  6.  Ambulate > or = to 650 feet in 3 minutes to improve community function  Baseline: 442 feet Goal status:  NEW   PLAN:  PT FREQUENCY: 1-2x/week  PT DURATION: 8 weeks- 7 visit remain on insurance  PLANNED INTERVENTIONS: 97110-Therapeutic exercises, 97530- Therapeutic activity, 97112- Neuromuscular re-education, [phone]- Self Care, [id number]- Manual therapy, [phone]- Gait training, [phone]- Aquatic Therapy, [phone]- Electrical stimulation (unattended), [phone]- Electrical stimulation (manual), [id number]- Vasopneumatic device, [id number]- Ultrasound, [id number]- Ionotophoresis 4mg /ml Dexamethasone , Patient/Family education, Balance training, Stair training, Taping, Joint mobilization, Spinal mobilization, Cryotherapy, and Moist heat  PLAN FOR NEXT SESSION:  front load 3' walk test for new LTG before too much LE fatigue, work on eccentric Lt quad strength in closed chain, endurance, strength, gait, standing, try hip matrix   Orvil Fester, PT 02/05/24 3:32 PM      [facility] [address], [address] [place], [place] [address] Phone # [phone] Fax [phone]

## 2024-02-06 ENCOUNTER — Other Ambulatory Visit: Payer: Self-pay

## 2024-02-07 ENCOUNTER — Other Ambulatory Visit: Payer: Self-pay

## 2024-02-07 ENCOUNTER — Other Ambulatory Visit (HOSPITAL_COMMUNITY): Payer: Self-pay

## 2024-02-08 ENCOUNTER — Ambulatory Visit: Admitting: Physical Therapy

## 2024-02-12 ENCOUNTER — Other Ambulatory Visit: Payer: Self-pay

## 2024-02-12 ENCOUNTER — Other Ambulatory Visit (HOSPITAL_COMMUNITY): Payer: Self-pay

## 2024-02-12 ENCOUNTER — Ambulatory Visit

## 2024-02-12 DIAGNOSIS — M6281 Muscle weakness (generalized): Secondary | ICD-10-CM

## 2024-02-12 DIAGNOSIS — R262 Difficulty in walking, not elsewhere classified: Secondary | ICD-10-CM

## 2024-02-12 DIAGNOSIS — M25652 Stiffness of left hip, not elsewhere classified: Secondary | ICD-10-CM | POA: Diagnosis not present

## 2024-02-12 DIAGNOSIS — R2681 Unsteadiness on feet: Secondary | ICD-10-CM

## 2024-02-12 DIAGNOSIS — R293 Abnormal posture: Secondary | ICD-10-CM

## 2024-02-12 DIAGNOSIS — M25552 Pain in left hip: Secondary | ICD-10-CM

## 2024-02-12 NOTE — Therapy (Signed)
 OUTPATIENT PHYSICAL THERAPY TREATMENT NOTE   Patient Name: [name] MRN: [id number] DOB:[birth year], 63 y.o., male Today's Date: 02/12/2024  END OF SESSION:  PT End of Session - 02/12/24 1527     Visit Number 22    Number of Visits 27    Date for Recertification  03/11/24    Authorization Type Medicaid no auth 27 visits    Authorization - Visit Number 22    Authorization - Number of Visits 27    PT Start Time 1446    PT Stop Time 1528    PT Time Calculation (min) 42 min    Activity Tolerance Patient tolerated treatment well    Behavior During Therapy WFL for tasks assessed/performed                           Past Medical History:  Diagnosis Date   Cardiomyopathy (HCC)    a. EF 45% in 2019.   CHF (congestive heart failure) (HCC)    Chronic anticoagulation 09/30/2017   Diabetes mellitus type 2 in nonobese [facility])    Does not have health insurance    Essential hypertension 09/26/2017   Financial difficulties    H/O noncompliance with medical treatment, presenting hazards to health    Hardware complicating wound infection 09/25/2022   Non-insulin  treated type 2 diabetes mellitus (HCC) 09/26/2017   Persistent atrial fibrillation (HCC)    Sleep apnea suspected 09/30/2017   years ago- not anymore per patient   Past Surgical History:  Procedure Laterality Date   ABDOMINAL AORTOGRAM W/LOWER EXTREMITY N/A 09/02/2021   Procedure: ABDOMINAL AORTOGRAM W/LOWER EXTREMITY;  Surgeon: Serene Gaile ORN, MD;  [facility]: [facility];  Service: Cardiovascular;  Laterality: N/A;   ABDOMINAL AORTOGRAM W/LOWER EXTREMITY N/A 12/28/2021   Procedure: ABDOMINAL AORTOGRAM W/LOWER EXTREMITY;  Surgeon: Serene Gaile ORN, MD;  Location: [facility];  Service: Cardiovascular;  Laterality: N/A;   ABDOMINAL AORTOGRAM W/LOWER EXTREMITY N/A 07/28/2022   Procedure: ABDOMINAL AORTOGRAM W/LOWER EXTREMITY;  Surgeon: Gretta Lonni PARAS, MD;  [facility]: [facility];   Service: Cardiovascular;  Laterality: N/A;   AMPUTATION Right 09/04/2021   Procedure: 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;  Surgeon: Harden Jerona GAILS, MD;  Location: [facility] OR;  Service: Orthopedics;  Laterality: Right;   AMPUTATION Right 08/02/2022   Procedure: AMPUTATION RAY 5TH METATARSAL OF RIGHT FOOT;  Surgeon: Barton Drape, MD;  Location: [facility] OR;  Service: Orthopedics;  Laterality: Right;   APPENDECTOMY  1971   BONE BIOPSY Right 08/02/2022   Procedure: BONE BIOPSY OF 5TH METATARSAL RIGHT FOOT;  Surgeon: Barton Drape, MD;  Location: [facility];  Service: Orthopedics;  Laterality: Right;   CARDIOVERSION N/A 09/28/2017   Procedure: CARDIOVERSION;  Surgeon: Francyne Headland, MD;  [facility];  Service: Cardiovascular;  Laterality: N/A;   CLIPPING OF ATRIAL APPENDAGE N/A 04/18/2023   Procedure: CLIPPING OF ATRIAL APPENDAGE USING ATRICURE ATRICLIP [id number] with pulmonary vein isolation ablation;  Surgeon: Lucas Dorise POUR, MD;  Location: [facility];  Service: Open Heart Surgery;  Laterality: N/A;   CORONARY ARTERY BYPASS GRAFT N/A 04/18/2023   Procedure: CORONARY ARTERY BYPASS GRAFTING (CABG) TIMES FOUR USING LEFT INTERNAL MAMMARY ARTERY AND ENDOSCOPICALLY HARVESTED LEFT GREATER SAPHENOUS VEIN;  Surgeon: Lucas Dorise POUR, MD;  [facility];  Service: Open Heart Surgery;  Laterality: N/A;   IRRIGATION AND DEBRIDEMENT FOOT  08/02/2022   Procedure: IRRIGATION AND DEBRIDEMENT RIGHT 5TH METATARSAL FOOT;  Surgeon: Barton Drape, MD;  [facility];  Service: Orthopedics;;   LAMINECTOMY WITH POSTERIOR LATERAL ARTHRODESIS LEVEL 4 N/A 08/30/2022   Procedure: THORACIC FIVE-THORACIC ELEVEN FUSION, THORACIC EIGHT TRANSPEDICULAR DECOMPRESSION AND PARTIAL CORPECTOMY with O-Arm;  Surgeon: Debby Dorn MATSU, MD;  [facility];  Service: Neurosurgery;  Laterality: N/A;   LEFT HEART CATH AND CORONARY ANGIOGRAPHY N/A 04/12/2023   Procedure: LEFT HEART CATH AND CORONARY ANGIOGRAPHY;  Surgeon: Verlin Lonni BIRCH,  MD;  [facility];  Service: Cardiovascular;  Laterality: N/A;   NASAL ENDOSCOPY Right 06/30/2023   Procedure: ENDOSCOPY, NOSE;  Surgeon: Jesus Oliphant, MD;  Location: [facility] OR;  Service: ENT;  Laterality: Right;   PERIPHERAL VASCULAR BALLOON ANGIOPLASTY  09/02/2021   Procedure: PERIPHERAL VASCULAR BALLOON ANGIOPLASTY;  Surgeon: Serene Gaile ORN, MD;  [facility];  Service: Cardiovascular;;   PERIPHERAL VASCULAR BALLOON ANGIOPLASTY  12/28/2021   Procedure: PERIPHERAL VASCULAR BALLOON ANGIOPLASTY;  Surgeon: Serene Gaile ORN, MD;  [facility];  Service: Cardiovascular;;  Posterior Tibial PTA only   POLYPECTOMY Right 06/30/2023   Procedure: POLYPECTOMY, NASAL CAVITY;  Surgeon: Jesus Oliphant, MD;  Location: [facility] OR;  Service: ENT;  Laterality: Right;   TEE WITHOUT CARDIOVERSION N/A 09/28/2017   Procedure: TRANSESOPHAGEAL ECHOCARDIOGRAM (TEE);  Surgeon: Francyne Headland, MD;  Location: [facility] ENDOSCOPY;  Service: Cardiovascular;  Laterality: N/A;   TEE WITHOUT CARDIOVERSION N/A 09/03/2021   Procedure: TRANSESOPHAGEAL ECHOCARDIOGRAM (TEE);  Surgeon: Raford Riggs, MD;  Location: [facility] ENDOSCOPY;  Service: Cardiovascular;  Laterality: N/A;   TEE WITHOUT CARDIOVERSION N/A 07/15/2022   Procedure: TRANSESOPHAGEAL ECHOCARDIOGRAM;  Surgeon: Francyne Headland, MD;  [facility];  Service: Cardiovascular;  Laterality: N/A;   TEE WITHOUT CARDIOVERSION N/A 04/18/2023   Procedure: TRANSESOPHAGEAL ECHOCARDIOGRAM (TEE);  Surgeon: Lucas Dorise POUR, MD;  Location: [facility] OR;  Service: Open Heart Surgery;  Laterality: N/A;   TOTAL HIP ARTHROPLASTY Left 07/14/2023   Procedure: ARTHROPLASTY, HIP, TOTAL, ANTERIOR APPROACH;  Surgeon: Kendal Franky SQUIBB, MD;  Location: [facility];  Service: Orthopedics;  Laterality: Left;   Patient Active Problem List   Diagnosis Date Noted   Left displaced femoral neck fracture (HCC) 07/13/2023   Closed left femoral fracture (HCC) 07/12/2023    History of CHF (congestive heart failure) 07/12/2023   Chronic kidney disease (CKD), stage III (moderate) (HCC) 07/12/2023   Hyperlipidemia 07/12/2023   BPH (benign prostatic hyperplasia) 07/12/2023   Peripheral neuropathy 07/12/2023   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   Bradycardia 04/14/2023   NSTEMI (non-ST elevated myocardial infarction) (HCC) 04/13/2023   Chronic bilateral low back pain 04/13/2023   History of CABG in  04/2023 04/12/2023   Acute decompensated heart failure (HCC) 04/10/2023   Chronic pain syndrome 04/09/2023   MRSA infection greater than 3 months ago 04/09/2023   Hypoglycemia 10/06/2022   Pleural effusion 09/29/2022   Chronic systolic CHF (congestive heart failure) (HCC) 09/29/2022   Acute urinary retention 09/29/2022   Anxiety 09/29/2022   Hardware complicating wound infection 09/25/2022   Discitis of thoracic region 09/25/2022   Hypokalemia 09/07/2022   Acute toxic encephalopathy 09/07/2022   Acute postoperative anemia due to expected blood loss 09/07/2022   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   Adjustment disorder with mixed anxiety and depressed mood 08/13/2022   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 08/08/2022   MRSA bacteremia 08/08/2022   AKI (acute kidney injury), ruled out 07/27/2022   Discitis thoracic region 07/27/2022   Bacteremia due to methicillin resistant Staphylococcus aureus 07/27/2022   Severe protein-calorie  malnutrition 07/26/2022   PVD (peripheral vascular disease) 07/26/2022   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   Bacteremia 07/15/2022   Atelectasis 07/12/2022   Sepsis due to pneumonia (HCC) 07/12/2022   Acute on chronic diastolic (congestive) heart failure (HCC) 07/12/2022   Gangrene of toe of right foot (HCC)    Sepsis (HCC) 08/29/2021   SIRS (systemic inflammatory response syndrome) (HCC) 08/29/2021   Hyponatremia 08/29/2021   Hypomagnesemia 08/29/2021   Pure hypercholesterolemia 06/17/2021   Pain of left hand 09/06/2019   Cellulitis  08/26/2019   Persistent atrial fibrillation (HCC) 08/26/2019   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) 10/18/2018   Transaminitis 10/17/2018   Hyperbilirubinemia 10/17/2018   Leukocytosis 10/17/2018   Sinusitis 10/17/2018   Chronic anticoagulation 09/30/2017   Smoker 09/30/2017   Cardiomyopathy (HCC) 09/30/2017   Sleep apnea suspected 09/30/2017   Essential hypertension 09/26/2017   Insulin  dependent type 2 diabetes mellitus (HCC) 09/26/2017   Permanent atrial fibrillation (HCC) 09/26/2017    PCP: Delbert Clam, MD  REFERRING PROVIDER: Kendal Franky SQUIBB, MD  REFERRING DIAG: [phone] (ICD-10-CM) - Status post total hip replacement, left  THERAPY DIAG:  Stiffness of left hip, not elsewhere classified  Muscle weakness (generalized)  Difficulty in walking, not elsewhere classified  Pain in left hip  Unsteadiness on feet  Abnormal posture  Rationale for Evaluation and Treatment: Rehabilitation  ONSET DATE: 09/26/2023  SUBJECTIVE:   SUBJECTIVE STATEMENT:  I can tell that the weather is colder now.  Let's do the 3 min walk test next session.   EVAL: Patient had series of orthopedic issues. He was in PT for back pain when he was found to have MRSA infection in spine May 2024, surgery followed for fusion/reconstruction, January had CABG, was still on cane once he got home and fell in bathroom and suffered hip fracture on left with subsequent THA. His THA was on 07/14/23. He was in rehab for several weeks and is now home. He felt he did not get aggressive enough PT in rehab. He then had home health and only did this 1 time per week. He did his exercises and is very motivated to get his function back. He is unsure if he will return to work. He is concerned about the physicality of the job with all that he has endured. He states this all depends on how much function he gets back. His goal is to be able to walk without the cane and build some upper body strength to  be able to function at the level he did before and do his job.   PERTINENT HISTORY: Left LE neural impairment since back surgery; right toe amputations See above for extensive medical history  PAIN: 02/12/24 Are you having pain? Yes: NPRS scale: 0-2/10 Pain location: right hip and left low back Pain description: aching / stiff Aggravating factors: standing > 20 min, weather changes  Relieving factors: moving, Aleve  PRECAUTIONS: Fall  RED FLAGS: None   WEIGHT BEARING RESTRICTIONS: No  FALLS:  Has patient fallen in last 6 months? Yes. Number of falls 1  LIVING ENVIRONMENT: Lives with: lives with their spouse Lives in: House/apartment Stairs: No Has following equipment at home: Single point cane  OCCUPATION: chef  PLOF: Independent, Independent with basic ADLs, Independent with household mobility without device, Independent with community mobility without device, Independent with homemaking with ambulation, Independent with gait, and Independent with transfers  PATIENT GOALS: To be able to walk without the cane and build some upper body strength to  be able to function at the level he did before and do his job.    NEXT MD VISIT: 2 months  OBJECTIVE:  Note: Objective measures were completed at Evaluation unless otherwise noted.  DIAGNOSTIC FINDINGS: na  PATIENT SURVEYS:  Eval:  LEFS : 30/80= 50% 11/08/2023:  Lower Extremity Functional Score: 26 / 80 = 32.5 % 11/28/2023:  Lower Extremity Functional Score: 38 / 80 = 47.5 % 01/08/24: 51/80 = 64%  COGNITION: Overall cognitive status: Within functional limits for tasks assessed     SENSATION: WFL   MUSCLE LENGTH: Hips tight bilaterally in hip flexors, quads and rotation both IR and ER   Left > Right  POSTURE: rounded shoulders, forward head, decreased lumbar lordosis, posterior pelvic tilt, and flexed trunk    LOWER EXTREMITY ROM:  WFL  LOWER EXTREMITY MMT:  MMT Right eval Left eval Left 11/28/23 Left 01/08/24   Hip flexion  4 4+ 4+  Hip extension  4- 4 4+  Hip abduction  4 4+ 4+  Hip adduction  4 4+ 5  Hip internal rotation  4 4 4+  Hip external rotation  4 4+ 4+  Knee flexion  4 4+ 5  Knee extension  4+ 4+ Unable to control quad eccentrically for descending stairs, open chain testing: 4+/5  Ankle dorsiflexion      Ankle plantarflexion      Ankle inversion      Ankle eversion       (Blank rows = not tested)   FUNCTIONAL TESTS:  Eval: 5 times sit to stand: 15.14 sec (no UE use) Timed up and go (TUG): 14.00 sec  11/28/23: 5 times sit to stand: 11.67 sec (no UE use) Timed up and go (TUG): 9.34 sec  01/08/24: 5 times sit to stand: 9.42 sec (no UE use) TUG: 8.30 sec  01/15/24: 3 min walk test: 442 feet (age related norm for 6 min is 2009 feet)    GAIT: Distance walked: 50 feet Assistive device utilized: Single point cane Level of assistance: SBA Comments: antalgic/guarded/ slow                                                                                                                                TREATMENT DATE:  02/12/24 NuStep L6x6' PT present to discuss current status Chair sit up 8lb 2x20, then russian twists 8lb 2x20 Sit to stand chair + pad holding 8lb x10 Reverse walking x10 10lb holding cable pulleys - this challenged his balance more so close supervision but no LOB with all reps Leg press bil LE 80lb seat 8: 2x20, Lt only 60# x15, Rt only 70#x15 each  Lateral step over hurdle tap and back x10 bil UE support Green tband bil shoulder ext x20 Blue tband bil shoulder row x20  02/05/24 NuStep L6x6' PT present to discuss current status Chair sit up 7lb 2x10, then russian twists 7lb 2x10 Sit to stand chair + pad holding  7lb x10 Reverse walking x8 10lb holding cable pulleys - this challenged his balance more so close supervision but no LOB with all reps Leg press bil LE 80lb seat 8: 2x20, Lt only 50# x10, Rt only 70#x15 each  Lateral step over hurdle tap and back  x10 bil UE support Green tband bil shoulder ext x20 Blue tband bil shoulder row x20    01/15/24 NuStep L6x6' PT present to discuss current status Chair sit up 6lb 2x10, then russian twists 5lb 2x10 3 min walk test: 442 feet  Leg press bil LE 80lb seat 8: 2x20, Lt only 50#, Rt only 70#x10 each  Resisted walking 10# in reverse x10 Session ended early due to pt report of fatigue from lack of sleep last night.      ASSESSMENT:   CLINICAL IMPRESSION:  Pt continues to report improved stability at home.  He requires less guard with standing exercises.  He was more fatigued today so wants to do 3 min walk test next session.   He continues to have low back pain that limits his ability to stand. He takes short rest breaks in between exercises. He increased weights and reps with exercises today.  Patient will benefit from skilled PT to address the below impairments and improve overall function.   PATIENT EDUCATION:  Education details: Initiated HEP Person educated: Patient Education method: Programmer, Multimedia, Facilities Manager, Verbal cues, and Handouts Education comprehension: verbalized understanding and returned demonstration  HOME EXERCISE PROGRAM: Access Code: [email or web address] URL: [email or web address] Date: 11/08/2023 Prepared by: Jarrell Menke  Exercises - Seated Hip Abduction with Resistance  - 1 x daily - 7 x weekly - 1 sets - 20 reps - Seated March with Resistance  - 1 x daily - 7 x weekly - 2 sets - 8 reps - Squat with Chair Touch  - 1 x daily - 7 x weekly - 4 sets - 5 reps - Side Stepping with Resistance at Ankles  - 1 x daily - 7 x weekly - 3 sets - 10 reps - Forward Step Touch  - 2-3 x daily - 7 x weekly - 1 sets - 5-8 reps - Standing 3-way Hip with Walker  - 2-3 x daily - 7 x weekly - 1 sets - 5-8 reps    OBJECTIVE IMPAIRMENTS: Abnormal gait, cardiopulmonary status limiting activity, decreased activity tolerance, decreased balance, decreased endurance, difficulty walking,  decreased ROM, decreased strength, increased fascial restrictions, increased muscle spasms, impaired flexibility, impaired UE functional use, improper body mechanics, postural dysfunction, and pain.   ACTIVITY LIMITATIONS: carrying, lifting, bending, sitting, standing, squatting, sleeping, stairs, transfers, bed mobility, bathing, toileting, dressing, reach over head, hygiene/grooming, and caring for others  PARTICIPATION LIMITATIONS: meal prep, cleaning, laundry, driving, shopping, community activity, occupation, yard work, and church  PERSONAL FACTORS: Fitness, Past/current experiences, Profession, Time since onset of injury/illness/exacerbation, and 3+ comorbidities: spine surgery, CABG, diabetes and Htn are also affecting patient's functional outcome.   REHAB POTENTIAL: Good  CLINICAL DECISION MAKING: Unstable/unpredictable  EVALUATION COMPLEXITY: High   GOALS: Goals reviewed with patient? Yes  SHORT TERM GOALS: Target date: 10/26/2023  Pain report to be no greater than 4/10  Baseline: Goal status: Met on 11/01/23  2.  Patient will be independent with initial HEP  Baseline:  Goal status: Met on 10/18/23  3.  Patient to be able to ambulate 50 feet without a.d Baseline:  Goal status: Met on 11/01/2023  4.  Patient to be able to do 10 sit to stand without UE support  Baseline:  Goal status: Met on 11/08/23    LONG TERM GOALS: Target date: 03/11/2024   Patient to report pain no greater than 2/10  Baseline:  Goal status: Progressing   2.  Patient to be independent with advanced HEP  Baseline:  Goal status: ongoing   3.  Patient to be able to stand or walk for at least 15 min without pain or need to rest Baseline: 1 hour (01/01/24) Goal status: MET  4.  Patient to be able to ascend and descend 12 steps safely Baseline: max UE support and reduced eccentric control on the Lt (12/25/23) Goal status: Ongoing for descending 01/08/24  5.  Patient to report 85% improvement in  overall symptoms and function Baseline:  Goal status: Ongoing, 60-70% 01/08/24  6.  Ambulate > or = to 650 feet in 3 minutes to improve community function  Baseline: 442 feet Goal status:  NEW   PLAN:  PT FREQUENCY: 1-2x/week  PT DURATION: 8 weeks- 7 visit remain on insurance  PLANNED INTERVENTIONS: 97110-Therapeutic exercises, 97530- Therapeutic activity, 97112- Neuromuscular re-education, [phone]- Self Care, 02859- Manual therapy, [phone]- Gait training, [phone]- Aquatic Therapy, [phone]- Electrical stimulation (unattended), [phone]- Electrical stimulation (manual), [id number]- Vasopneumatic device, [id number]- Ultrasound, [id number]- Ionotophoresis 4mg /ml Dexamethasone , Patient/Family education, Balance training, Stair training, Taping, Joint mobilization, Spinal mobilization, Cryotherapy, and Moist heat  PLAN FOR NEXT SESSION:  3 min walk test at beginning of session next, work on eccentric Lt quad strength in closed chain, endurance, strength, gait, standing, try hip matrix   Burnard Joy, PT 02/12/24 3:28 PM       [facility] [address], [address] [place], [place] [address] Phone # [phone] Fax [phone]

## 2024-02-13 ENCOUNTER — Other Ambulatory Visit (HOSPITAL_COMMUNITY): Payer: Self-pay

## 2024-02-15 ENCOUNTER — Encounter: Payer: Self-pay | Admitting: Physical Therapy

## 2024-02-15 ENCOUNTER — Ambulatory Visit: Admitting: Physical Therapy

## 2024-02-15 DIAGNOSIS — R293 Abnormal posture: Secondary | ICD-10-CM

## 2024-02-15 DIAGNOSIS — M25652 Stiffness of left hip, not elsewhere classified: Secondary | ICD-10-CM | POA: Diagnosis not present

## 2024-02-15 DIAGNOSIS — M6281 Muscle weakness (generalized): Secondary | ICD-10-CM

## 2024-02-15 DIAGNOSIS — R2681 Unsteadiness on feet: Secondary | ICD-10-CM

## 2024-02-15 DIAGNOSIS — R262 Difficulty in walking, not elsewhere classified: Secondary | ICD-10-CM

## 2024-02-15 DIAGNOSIS — M25552 Pain in left hip: Secondary | ICD-10-CM

## 2024-02-15 NOTE — Therapy (Signed)
 OUTPATIENT PHYSICAL THERAPY TREATMENT NOTE   Patient Name: David [name] MRN: [id number] DOB:1960/09/07, 63 y.o., male Today's Date: 02/15/2024  END OF SESSION:  PT End of Session - 02/15/24 1359     Visit Number 23    Number of Visits 27    Date for Recertification  03/11/24    Authorization Type Medicaid no auth 27 visits    Authorization - Visit Number 23    Authorization - Number of Visits 27    PT Start Time 1400    PT Stop Time 1439    PT Time Calculation (min) 39 min    Activity Tolerance Patient tolerated treatment well    Behavior During Therapy WFL for tasks assessed/performed                            Past Medical History:  Diagnosis Date   Cardiomyopathy (HCC)    a. EF 45% in 2019.   CHF (congestive heart failure) (HCC)    Chronic anticoagulation 09/30/2017   Diabetes mellitus type 2 in nonobese [facility])    Does not have health insurance    Essential hypertension 09/26/2017   Financial difficulties    H/O noncompliance with medical treatment, presenting hazards to health    Hardware complicating wound infection 09/25/2022   Non-insulin  treated type 2 diabetes mellitus (HCC) 09/26/2017   Persistent atrial fibrillation (HCC)    Sleep apnea suspected 09/30/2017   years ago- not anymore per patient   Past Surgical History:  Procedure Laterality Date   ABDOMINAL AORTOGRAM W/LOWER EXTREMITY N/A 09/02/2021   Procedure: ABDOMINAL AORTOGRAM W/LOWER EXTREMITY;  Surgeon: Serene Gaile ORN, MD;  [facility]: [facility];  Service: Cardiovascular;  Laterality: N/A;   ABDOMINAL AORTOGRAM W/LOWER EXTREMITY N/A 12/28/2021   Procedure: ABDOMINAL AORTOGRAM W/LOWER EXTREMITY;  Surgeon: Serene Gaile ORN, MD;  Location: [facility];  Service: Cardiovascular;  Laterality: N/A;   ABDOMINAL AORTOGRAM W/LOWER EXTREMITY N/A 07/28/2022   Procedure: ABDOMINAL AORTOGRAM W/LOWER EXTREMITY;  Surgeon: Gretta Lonni PARAS, MD;  Location: [facility];   Service: Cardiovascular;  Laterality: N/A;   AMPUTATION Right 09/04/2021   Procedure: 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;  Surgeon: Harden Jerona GAILS, MD;  Location: [facility] OR;  Service: Orthopedics;  Laterality: Right;   AMPUTATION Right 08/02/2022   Procedure: AMPUTATION RAY 5TH METATARSAL OF RIGHT FOOT;  Surgeon: Barton Drape, MD;  Location: [facility];  Service: Orthopedics;  Laterality: Right;   APPENDECTOMY  1971   BONE BIOPSY Right 08/02/2022   Procedure: BONE BIOPSY OF 5TH METATARSAL RIGHT FOOT;  Surgeon: Barton Drape, MD;  Location: [facility];  Service: Orthopedics;  Laterality: Right;   CARDIOVERSION N/A 09/28/2017   Procedure: CARDIOVERSION;  Surgeon: Francyne Headland, MD;  [facility]: [facility];  Service: Cardiovascular;  Laterality: N/A;   CLIPPING OF ATRIAL APPENDAGE N/A 04/18/2023   Procedure: CLIPPING OF ATRIAL APPENDAGE USING ATRICURE ATRICLIP [id number] with pulmonary vein isolation ablation;  Surgeon: Lucas Dorise POUR, MD;  Location: [facility];  Service: Open Heart Surgery;  Laterality: N/A;   CORONARY ARTERY BYPASS GRAFT N/A 04/18/2023   Procedure: CORONARY ARTERY BYPASS GRAFTING (CABG) TIMES FOUR USING LEFT INTERNAL MAMMARY ARTERY AND ENDOSCOPICALLY HARVESTED LEFT GREATER SAPHENOUS VEIN;  Surgeon: Lucas Dorise POUR, MD;  Location: [facility];  Service: Open Heart Surgery;  Laterality: N/A;   IRRIGATION AND DEBRIDEMENT FOOT  08/02/2022   Procedure: IRRIGATION AND DEBRIDEMENT RIGHT 5TH METATARSAL FOOT;  Surgeon: Barton Drape,  MD;  [facility];  Service: Orthopedics;;   LAMINECTOMY WITH POSTERIOR LATERAL ARTHRODESIS LEVEL 4 N/A 08/30/2022   Procedure: THORACIC FIVE-THORACIC ELEVEN FUSION, THORACIC EIGHT TRANSPEDICULAR DECOMPRESSION AND PARTIAL CORPECTOMY with O-Arm;  Surgeon: Debby Dorn MATSU, MD;  Location: [facility] OR;  Service: Neurosurgery;  Laterality: N/A;   LEFT HEART CATH AND CORONARY ANGIOGRAPHY N/A 04/12/2023   Procedure: LEFT HEART CATH AND CORONARY ANGIOGRAPHY;  Surgeon: Verlin Lonni BIRCH,  MD;  [facility];  Service: Cardiovascular;  Laterality: N/A;   NASAL ENDOSCOPY Right 06/30/2023   Procedure: ENDOSCOPY, NOSE;  Surgeon: Jesus Oliphant, MD;  Location: [facility] OR;  Service: ENT;  Laterality: Right;   PERIPHERAL VASCULAR BALLOON ANGIOPLASTY  09/02/2021   Procedure: PERIPHERAL VASCULAR BALLOON ANGIOPLASTY;  Surgeon: Serene Gaile ORN, MD;  [facility];  Service: Cardiovascular;;   PERIPHERAL VASCULAR BALLOON ANGIOPLASTY  12/28/2021   Procedure: PERIPHERAL VASCULAR BALLOON ANGIOPLASTY;  Surgeon: Serene Gaile ORN, MD;  [facility];  Service: Cardiovascular;;  Posterior Tibial PTA only   POLYPECTOMY Right 06/30/2023   Procedure: POLYPECTOMY, NASAL CAVITY;  Surgeon: Jesus Oliphant, MD;  Location: [facility] OR;  Service: ENT;  Laterality: Right;   TEE WITHOUT CARDIOVERSION N/A 09/28/2017   Procedure: TRANSESOPHAGEAL ECHOCARDIOGRAM (TEE);  Surgeon: Francyne Headland, MD;  Location: [facility] ENDOSCOPY;  Service: Cardiovascular;  Laterality: N/A;   TEE WITHOUT CARDIOVERSION N/A 09/03/2021   Procedure: TRANSESOPHAGEAL ECHOCARDIOGRAM (TEE);  Surgeon: Raford Riggs, MD;  Location: [facility] ENDOSCOPY;  Service: Cardiovascular;  Laterality: N/A;   TEE WITHOUT CARDIOVERSION N/A 07/15/2022   Procedure: TRANSESOPHAGEAL ECHOCARDIOGRAM;  Surgeon: Francyne Headland, MD;  [facility];  Service: Cardiovascular;  Laterality: N/A;   TEE WITHOUT CARDIOVERSION N/A 04/18/2023   Procedure: TRANSESOPHAGEAL ECHOCARDIOGRAM (TEE);  Surgeon: Lucas Dorise POUR, MD;  Location: [facility] OR;  Service: Open Heart Surgery;  Laterality: N/A;   TOTAL HIP ARTHROPLASTY Left 07/14/2023   Procedure: ARTHROPLASTY, HIP, TOTAL, ANTERIOR APPROACH;  Surgeon: Kendal Franky SQUIBB, MD;  Location: [facility];  Service: Orthopedics;  Laterality: Left;   Patient Active Problem List   Diagnosis Date Noted   Left displaced femoral neck fracture (HCC) 07/13/2023   Closed left femoral fracture (HCC) 07/12/2023    History of CHF (congestive heart failure) 07/12/2023   Chronic kidney disease (CKD), stage III (moderate) (HCC) 07/12/2023   Hyperlipidemia 07/12/2023   BPH (benign prostatic hyperplasia) 07/12/2023   Peripheral neuropathy 07/12/2023   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   Bradycardia 04/14/2023   NSTEMI (non-ST elevated myocardial infarction) (HCC) 04/13/2023   Chronic bilateral low back pain 04/13/2023   History of CABG in  04/2023 04/12/2023   Acute decompensated heart failure (HCC) 04/10/2023   Chronic pain syndrome 04/09/2023   MRSA infection greater than 3 months ago 04/09/2023   Hypoglycemia 10/06/2022   Pleural effusion 09/29/2022   Chronic systolic CHF (congestive heart failure) (HCC) 09/29/2022   Acute urinary retention 09/29/2022   Anxiety 09/29/2022   Hardware complicating wound infection 09/25/2022   Discitis of thoracic region 09/25/2022   Hypokalemia 09/07/2022   Acute toxic encephalopathy 09/07/2022   Acute postoperative anemia due to expected blood loss 09/07/2022   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   Adjustment disorder with mixed anxiety and depressed mood 08/13/2022   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 08/08/2022   MRSA bacteremia 08/08/2022   AKI (acute kidney injury), ruled out 07/27/2022   Discitis thoracic region 07/27/2022   Bacteremia due to methicillin resistant Staphylococcus aureus 07/27/2022  Severe protein-calorie malnutrition 07/26/2022   PVD (peripheral vascular disease) 07/26/2022   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   Bacteremia 07/15/2022   Atelectasis 07/12/2022   Sepsis due to pneumonia (HCC) 07/12/2022   Acute on chronic diastolic (congestive) heart failure (HCC) 07/12/2022   Gangrene of toe of right foot (HCC)    Sepsis (HCC) 08/29/2021   SIRS (systemic inflammatory response syndrome) (HCC) 08/29/2021   Hyponatremia 08/29/2021   Hypomagnesemia 08/29/2021   Pure hypercholesterolemia 06/17/2021   Pain of left hand 09/06/2019   Cellulitis  08/26/2019   Persistent atrial fibrillation (HCC) 08/26/2019   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) 10/18/2018   Transaminitis 10/17/2018   Hyperbilirubinemia 10/17/2018   Leukocytosis 10/17/2018   Sinusitis 10/17/2018   Chronic anticoagulation 09/30/2017   Smoker 09/30/2017   Cardiomyopathy (HCC) 09/30/2017   Sleep apnea suspected 09/30/2017   Essential hypertension 09/26/2017   Insulin  dependent type 2 diabetes mellitus (HCC) 09/26/2017   Permanent atrial fibrillation (HCC) 09/26/2017    PCP: Delbert Clam, MD  REFERRING PROVIDER: Kendal Franky SQUIBB, MD  REFERRING DIAG: [phone] (ICD-10-CM) - Status post total hip replacement, left  THERAPY DIAG:  Stiffness of left hip, not elsewhere classified  Muscle weakness (generalized)  Difficulty in walking, not elsewhere classified  Pain in left hip  Unsteadiness on feet  Abnormal posture  Rationale for Evaluation and Treatment: Rehabilitation  ONSET DATE: 09/26/2023  SUBJECTIVE:   SUBJECTIVE STATEMENT:  I am feeling much better this week.  I am having some pain starting yesterday around the top front of the Lt hip.  EVAL: Patient had series of orthopedic issues. He was in PT for back pain when he was found to have MRSA infection in spine May 2024, surgery followed for fusion/reconstruction, January had CABG, was still on cane once he got home and fell in bathroom and suffered hip fracture on left with subsequent THA. His THA was on 07/14/23. He was in rehab for several weeks and is now home. He felt he did not get aggressive enough PT in rehab. He then had home health and only did this 1 time per week. He did his exercises and is very motivated to get his function back. He is unsure if he will return to work. He is concerned about the physicality of the job with all that he has endured. He states this all depends on how much function he gets back. His goal is to be able to walk without the cane and build  some upper body strength to be able to function at the level he did before and do his job.   PERTINENT HISTORY: Left LE neural impairment since back surgery; right toe amputations See above for extensive medical history  PAIN: 02/12/24 Are you having pain? Yes: NPRS scale: 3/10 Pain location: right hip and left low back Pain description: aching / stiff Aggravating factors: standing > 20 min, weather changes  Relieving factors: moving, Aleve  PRECAUTIONS: Fall  RED FLAGS: None   WEIGHT BEARING RESTRICTIONS: No  FALLS:  Has patient fallen in last 6 months? Yes. Number of falls 1  LIVING ENVIRONMENT: Lives with: lives with their spouse Lives in: House/apartment Stairs: No Has following equipment at home: Single point cane  OCCUPATION: chef  PLOF: Independent, Independent with basic ADLs, Independent with household mobility without device, Independent with community mobility without device, Independent with homemaking with ambulation, Independent with gait, and Independent with transfers  PATIENT GOALS: To be able to walk without the cane and build  some upper body strength to be able to function at the level he did before and do his job.    NEXT MD VISIT: 2 months  OBJECTIVE:  Note: Objective measures were completed at Evaluation unless otherwise noted.  DIAGNOSTIC FINDINGS: na  PATIENT SURVEYS:  Eval:  LEFS : 30/80= 50% 11/08/2023:  Lower Extremity Functional Score: 26 / 80 = 32.5 % 11/28/2023:  Lower Extremity Functional Score: 38 / 80 = 47.5 % 01/08/24: 51/80 = 64%  COGNITION: Overall cognitive status: Within functional limits for tasks assessed     SENSATION: WFL   MUSCLE LENGTH: Hips tight bilaterally in hip flexors, quads and rotation both IR and ER   Left > Right  POSTURE: rounded shoulders, forward head, decreased lumbar lordosis, posterior pelvic tilt, and flexed trunk    LOWER EXTREMITY ROM:  WFL  LOWER EXTREMITY MMT:  MMT Right eval Left eval  Left 11/28/23 Left 01/08/24  Hip flexion  4 4+ 4+  Hip extension  4- 4 4+  Hip abduction  4 4+ 4+  Hip adduction  4 4+ 5  Hip internal rotation  4 4 4+  Hip external rotation  4 4+ 4+  Knee flexion  4 4+ 5  Knee extension  4+ 4+ Unable to control quad eccentrically for descending stairs, open chain testing: 4+/5  Ankle dorsiflexion      Ankle plantarflexion      Ankle inversion      Ankle eversion       (Blank rows = not tested)   FUNCTIONAL TESTS:  Eval: 5 times sit to stand: 15.14 sec (no UE use) Timed up and go (TUG): 14.00 sec  11/28/23: 5 times sit to stand: 11.67 sec (no UE use) Timed up and go (TUG): 9.34 sec  01/08/24: 5 times sit to stand: 9.42 sec (no UE use) TUG: 8.30 sec  01/15/24: 3 min walk test: 442 feet (age related norm for 6 min is 2009 feet)  02/15/24: covered 680' without AD with maintained pace throughout, RPE 6/10 with cardiovascular challenge >> any increase in pain    GAIT: Distance walked: 50 feet Assistive device utilized: Single point cane Level of assistance: SBA Comments: antalgic/guarded/ slow                                                                                                                                TREATMENT DATE:  02/15/24 NuStep L6x6' PT present to discuss status Lt hip flexor stretch in chair 2x30 Seated fig 4 alt between push knee down and pull knee across every 5 sec - initial pain with push knee down but it improved with repeated reps : 680' no AD - RPE 6/10 cardiovascular challenged more than pain - MET goal Chair sit up 8lb 2x20, then russian twists 8lb 2x20 Green tband bil shoulder ext x20 Blue tband bil shoulder row x20 Leg press bil LE 80lb seat 8: 2x20, Lt only 60# x15,  Rt only 70#x15 each   02/12/24 NuStep L6x6' PT present to discuss current status Chair sit up 8lb 2x20, then russian twists 8lb 2x20 Sit to stand chair + pad holding 8lb x10 Reverse walking x10 10lb holding cable pulleys - this  challenged his balance more so close supervision but no LOB with all reps Leg press bil LE 80lb seat 8: 2x20, Lt only 60# x15, Rt only 70#x15 each  Lateral step over hurdle tap and back x10 bil UE support Green tband bil shoulder ext x20 Blue tband bil shoulder row x20  02/05/24 NuStep L6x6' PT present to discuss current status Chair sit up 7lb 2x10, then russian twists 7lb 2x10 Sit to stand chair + pad holding 7lb x10 Reverse walking x8 10lb holding cable pulleys - this challenged his balance more so close supervision but no LOB with all reps Leg press bil LE 80lb seat 8: 2x20, Lt only 50# x10, Rt only 70#x15 each  Lateral step over hurdle tap and back x10 bil UE support Green tband bil shoulder ext x20 Blue tband bil shoulder row x20    ASSESSMENT:   CLINICAL IMPRESSION:  Pt arrived feeling good and wanted to tackle the .  He surpassed the goal today covering 680' without AD with well maintained pace throughout.  He had no LOB and demo'd good gait stability and arm swing.  He rated effort as 6/10 from a cardiovascular challenge vs pain.  He had some intermittent pain since yesterday AM on waking in the anterior/lateral aspect of Lt hip which improved with seated fig 4 today.  He continues to report improved stability at home.  He requires less guard with standing exercises.  He was more fatigued today so wants to do 3 min walk test next session.   He continues to have low back pain that limits his ability to stand. He takes short rest breaks in between exercises. He increased weights and reps with exercises today.  Patient will benefit from skilled PT to address the below impairments and improve overall function.   PATIENT EDUCATION:  Education details: Initiated HEP Person educated: Patient Education method: Programmer, Multimedia, Facilities Manager, Verbal cues, and Handouts Education comprehension: verbalized understanding and returned demonstration  HOME EXERCISE PROGRAM: Access Code:  [email or web address] URL: [email or web address] Date: 11/08/2023 Prepared by: Jarrell Menke  Exercises - Seated Hip Abduction with Resistance  - 1 x daily - 7 x weekly - 1 sets - 20 reps - Seated March with Resistance  - 1 x daily - 7 x weekly - 2 sets - 8 reps - Squat with Chair Touch  - 1 x daily - 7 x weekly - 4 sets - 5 reps - Side Stepping with Resistance at Ankles  - 1 x daily - 7 x weekly - 3 sets - 10 reps - Forward Step Touch  - 2-3 x daily - 7 x weekly - 1 sets - 5-8 reps - Standing 3-way Hip with Walker  - 2-3 x daily - 7 x weekly - 1 sets - 5-8 reps    OBJECTIVE IMPAIRMENTS: Abnormal gait, cardiopulmonary status limiting activity, decreased activity tolerance, decreased balance, decreased endurance, difficulty walking, decreased ROM, decreased strength, increased fascial restrictions, increased muscle spasms, impaired flexibility, impaired UE functional use, improper body mechanics, postural dysfunction, and pain.   ACTIVITY LIMITATIONS: carrying, lifting, bending, sitting, standing, squatting, sleeping, stairs, transfers, bed mobility, bathing, toileting, dressing, reach over head, hygiene/grooming, and caring for others  PARTICIPATION LIMITATIONS: meal prep, cleaning, laundry, driving, shopping, community activity,  occupation, yard work, and church  PERSONAL FACTORS: Fitness, Past/current experiences, Profession, Time since onset of injury/illness/exacerbation, and 3+ comorbidities: spine surgery, CABG, diabetes and Htn are also affecting patient's functional outcome.   REHAB POTENTIAL: Good  CLINICAL DECISION MAKING: Unstable/unpredictable  EVALUATION COMPLEXITY: High   GOALS: Goals reviewed with patient? Yes  SHORT TERM GOALS: Target date: 10/26/2023  Pain report to be no greater than 4/10  Baseline: Goal status: Met on 11/01/23  2.  Patient will be independent with initial HEP  Baseline:  Goal status: Met on 10/18/23  3.  Patient to be able to ambulate 50  feet without a.d Baseline:  Goal status: Met on 11/01/2023  4.  Patient to be able to do 10 sit to stand without UE support  Baseline:  Goal status: Met on 11/08/23    LONG TERM GOALS: Target date: 03/11/2024   Patient to report pain no greater than 2/10  Baseline:  Goal status: Progressing   2.  Patient to be independent with advanced HEP  Baseline:  Goal status: ongoing   3.  Patient to be able to stand or walk for at least 15 min without pain or need to rest Baseline: 1 hour (01/01/24) Goal status: MET  4.  Patient to be able to ascend and descend 12 steps safely Baseline: max UE support and reduced eccentric control on the Lt (12/25/23) Goal status: Ongoing for descending 01/08/24  5.  Patient to report 85% improvement in overall symptoms and function Baseline:  Goal status: Ongoing, 60-70% 01/08/24  6.  Ambulate > or = to 650 feet in 3 minutes to improve community function  Baseline: 442 feet Goal status:  MET 680' on 11/13   PLAN:  PT FREQUENCY: 1-2x/week  PT DURATION: 8 weeks- 7 visit remain on insurance  PLANNED INTERVENTIONS: 97110-Therapeutic exercises, 97530- Therapeutic activity, 97112- Neuromuscular re-education, [phone]- Self Care, 02859- Manual therapy, [phone]- Gait training, [phone]- Aquatic Therapy, [phone]- Electrical stimulation (unattended), [phone]- Electrical stimulation (manual), [id number]- Vasopneumatic device, [id number]- Ultrasound, [id number]- Ionotophoresis 4mg /ml Dexamethasone , Patient/Family education, Balance training, Stair training, Taping, Joint mobilization, Spinal mobilization, Cryotherapy, and Moist heat  PLAN FOR NEXT SESSION:  work on eccentric Lt quad strength in closed chain, endurance, strength, gait, standing, try hip matrix     Orvil Fester, PT 02/15/24 2:42 PM    [facility] [address], [address] [place], [place] [address] Phone # [phone] Fax [phone]

## 2024-02-19 ENCOUNTER — Ambulatory Visit: Admitting: Physical Therapy

## 2024-02-21 ENCOUNTER — Ambulatory Visit

## 2024-02-21 DIAGNOSIS — M25552 Pain in left hip: Secondary | ICD-10-CM

## 2024-02-21 DIAGNOSIS — M6281 Muscle weakness (generalized): Secondary | ICD-10-CM

## 2024-02-21 DIAGNOSIS — R2681 Unsteadiness on feet: Secondary | ICD-10-CM

## 2024-02-21 DIAGNOSIS — R262 Difficulty in walking, not elsewhere classified: Secondary | ICD-10-CM

## 2024-02-21 DIAGNOSIS — M25652 Stiffness of left hip, not elsewhere classified: Secondary | ICD-10-CM | POA: Diagnosis not present

## 2024-02-21 NOTE — Therapy (Signed)
 OUTPATIENT PHYSICAL THERAPY TREATMENT NOTE   Patient Name: David [name] MRN: [id number] DOB:08/17/1960, 63 y.o., male Today's Date: 02/21/2024  END OF SESSION:  PT End of Session - 02/21/24 1508     Visit Number 24    Date for Recertification  03/11/24    Authorization Type Medicaid no auth 27 visits    Authorization - Visit Number 24    Authorization - Number of Visits 27    PT Start Time 1447    PT Stop Time 1528    PT Time Calculation (min) 41 min    Activity Tolerance Patient tolerated treatment well    Behavior During Therapy WFL for tasks assessed/performed                             Past Medical History:  Diagnosis Date   Cardiomyopathy (HCC)    a. EF 45% in 2019.   CHF (congestive heart failure) (HCC)    Chronic anticoagulation 09/30/2017   Diabetes mellitus type 2 in nonobese [facility])    Does not have health insurance    Essential hypertension 09/26/2017   Financial difficulties    H/O noncompliance with medical treatment, presenting hazards to health    Hardware complicating wound infection 09/25/2022   Non-insulin  treated type 2 diabetes mellitus (HCC) 09/26/2017   Persistent atrial fibrillation (HCC)    Sleep apnea suspected 09/30/2017   years ago- not anymore per patient   Past Surgical History:  Procedure Laterality Date   ABDOMINAL AORTOGRAM W/LOWER EXTREMITY N/A 09/02/2021   Procedure: ABDOMINAL AORTOGRAM W/LOWER EXTREMITY;  Surgeon: Serene Gaile ORN, MD;  [facility]: [facility];  Service: Cardiovascular;  Laterality: N/A;   ABDOMINAL AORTOGRAM W/LOWER EXTREMITY N/A 12/28/2021   Procedure: ABDOMINAL AORTOGRAM W/LOWER EXTREMITY;  Surgeon: Serene Gaile ORN, MD;  [facility]: [facility];  Service: Cardiovascular;  Laterality: N/A;   ABDOMINAL AORTOGRAM W/LOWER EXTREMITY N/A 07/28/2022   Procedure: ABDOMINAL AORTOGRAM W/LOWER EXTREMITY;  Surgeon: Gretta Lonni PARAS, MD;  [facility]: [facility];  Service:  Cardiovascular;  Laterality: N/A;   AMPUTATION Right 09/04/2021   Procedure: 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;  Surgeon: Harden Jerona GAILS, MD;  Location: [facility] OR;  Service: Orthopedics;  Laterality: Right;   AMPUTATION Right 08/02/2022   Procedure: AMPUTATION RAY 5TH METATARSAL OF RIGHT FOOT;  Surgeon: Barton Drape, MD;  Location: [facility];  Service: Orthopedics;  Laterality: Right;   APPENDECTOMY  1971   BONE BIOPSY Right 08/02/2022   Procedure: BONE BIOPSY OF 5TH METATARSAL RIGHT FOOT;  Surgeon: Barton Drape, MD;  [facility];  Service: Orthopedics;  Laterality: Right;   CARDIOVERSION N/A 09/28/2017   Procedure: CARDIOVERSION;  Surgeon: Francyne Headland, MD;  [facility];  Service: Cardiovascular;  Laterality: N/A;   CLIPPING OF ATRIAL APPENDAGE N/A 04/18/2023   Procedure: CLIPPING OF ATRIAL APPENDAGE USING ATRICURE ATRICLIP [id number] with pulmonary vein isolation ablation;  Surgeon: Lucas Dorise POUR, MD;  Location: [facility];  Service: Open Heart Surgery;  Laterality: N/A;   CORONARY ARTERY BYPASS GRAFT N/A 04/18/2023   Procedure: CORONARY ARTERY BYPASS GRAFTING (CABG) TIMES FOUR USING LEFT INTERNAL MAMMARY ARTERY AND ENDOSCOPICALLY HARVESTED LEFT GREATER SAPHENOUS VEIN;  Surgeon: Lucas Dorise POUR, MD;  [facility]: [facility];  Service: Open Heart Surgery;  Laterality: N/A;   IRRIGATION AND DEBRIDEMENT FOOT  08/02/2022   Procedure: IRRIGATION AND DEBRIDEMENT RIGHT 5TH METATARSAL FOOT;  Surgeon: Barton Drape, MD;  [facility];  Service: Orthopedics;;   LAMINECTOMY WITH POSTERIOR LATERAL ARTHRODESIS LEVEL 4 N/A 08/30/2022   Procedure: THORACIC FIVE-THORACIC ELEVEN FUSION, THORACIC EIGHT TRANSPEDICULAR DECOMPRESSION AND PARTIAL CORPECTOMY with O-Arm;  Surgeon: Debby Dorn MATSU, MD;  Location[facility] OR;  Service: Neurosurgery;  Laterality: N/A;   LEFT HEART CATH AND CORONARY ANGIOGRAPHY N/A 04/12/2023   Procedure: LEFT HEART CATH AND CORONARY ANGIOGRAPHY;  Surgeon: Verlin Lonni BIRCH, MD;   [facility];  Service: Cardiovascular;  Laterality: N/A;   NASAL ENDOSCOPY Right 06/30/2023   Procedure: ENDOSCOPY, NOSE;  Surgeon: Jesus Oliphant, MD;  Location: [facility] OR;  Service: ENT;  Laterality: Right;   PERIPHERAL VASCULAR BALLOON ANGIOPLASTY  09/02/2021   Procedure: PERIPHERAL VASCULAR BALLOON ANGIOPLASTY;  Surgeon: Serene Gaile ORN, MD;  [facility];  Service: Cardiovascular;;   PERIPHERAL VASCULAR BALLOON ANGIOPLASTY  12/28/2021   Procedure: PERIPHERAL VASCULAR BALLOON ANGIOPLASTY;  Surgeon: Serene Gaile ORN, MD;  [facility];  Service: Cardiovascular;;  Posterior Tibial PTA only   POLYPECTOMY Right 06/30/2023   Procedure: POLYPECTOMY, NASAL CAVITY;  Surgeon: Jesus Oliphant, MD;  Location: [facility] OR;  Service: ENT;  Laterality: Right;   TEE WITHOUT CARDIOVERSION N/A 09/28/2017   Procedure: TRANSESOPHAGEAL ECHOCARDIOGRAM (TEE);  Surgeon: Francyne Headland, MD;  Location: [facility] ENDOSCOPY;  Service: Cardiovascular;  Laterality: N/A;   TEE WITHOUT CARDIOVERSION N/A 09/03/2021   Procedure: TRANSESOPHAGEAL ECHOCARDIOGRAM (TEE);  Surgeon: Raford Riggs, MD;  Location: [facility] ENDOSCOPY;  Service: Cardiovascular;  Laterality: N/A;   TEE WITHOUT CARDIOVERSION N/A 07/15/2022   Procedure: TRANSESOPHAGEAL ECHOCARDIOGRAM;  Surgeon: Francyne Headland, MD;  [facility];  Service: Cardiovascular;  Laterality: N/A;   TEE WITHOUT CARDIOVERSION N/A 04/18/2023   Procedure: TRANSESOPHAGEAL ECHOCARDIOGRAM (TEE);  Surgeon: Lucas Dorise POUR, MD;  Location: [facility] OR;  Service: Open Heart Surgery;  Laterality: N/A;   TOTAL HIP ARTHROPLASTY Left 07/14/2023   Procedure: ARTHROPLASTY, HIP, TOTAL, ANTERIOR APPROACH;  Surgeon: Kendal Franky SQUIBB, MD;  Location[facility];  Service: Orthopedics;  Laterality: Left;   Patient Active Problem List   Diagnosis Date Noted   Left displaced femoral neck fracture (HCC) 07/13/2023   Closed left femoral fracture (HCC) 07/12/2023   History of  CHF (congestive heart failure) 07/12/2023   Chronic kidney disease (CKD), stage III (moderate) (HCC) 07/12/2023   Hyperlipidemia 07/12/2023   BPH (benign prostatic hyperplasia) 07/12/2023   Peripheral neuropathy 07/12/2023   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   Bradycardia 04/14/2023   NSTEMI (non-ST elevated myocardial infarction) (HCC) 04/13/2023   Chronic bilateral low back pain 04/13/2023   History of CABG in  04/2023 04/12/2023   Acute decompensated heart failure (HCC) 04/10/2023   Chronic pain syndrome 04/09/2023   MRSA infection greater than 3 months ago 04/09/2023   Hypoglycemia 10/06/2022   Pleural effusion 09/29/2022   Chronic systolic CHF (congestive heart failure) (HCC) 09/29/2022   Acute urinary retention 09/29/2022   Anxiety 09/29/2022   Hardware complicating wound infection 09/25/2022   Discitis of thoracic region 09/25/2022   Hypokalemia 09/07/2022   Acute toxic encephalopathy 09/07/2022   Acute postoperative anemia due to expected blood loss 09/07/2022   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   Adjustment disorder with mixed anxiety and depressed mood 08/13/2022   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 08/08/2022   MRSA bacteremia 08/08/2022   AKI (acute kidney injury), ruled out 07/27/2022   Discitis thoracic region 07/27/2022   Bacteremia due to methicillin resistant Staphylococcus aureus 07/27/2022   Severe protein-calorie malnutrition 07/26/2022  PVD (peripheral vascular disease) 07/26/2022   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   Bacteremia 07/15/2022   Atelectasis 07/12/2022   Sepsis due to pneumonia (HCC) 07/12/2022   Acute on chronic diastolic (congestive) heart failure (HCC) 07/12/2022   Gangrene of toe of right foot (HCC)    Sepsis (HCC) 08/29/2021   SIRS (systemic inflammatory response syndrome) (HCC) 08/29/2021   Hyponatremia 08/29/2021   Hypomagnesemia 08/29/2021   Pure hypercholesterolemia 06/17/2021   Pain of left hand 09/06/2019   Cellulitis 08/26/2019    Persistent atrial fibrillation (HCC) 08/26/2019   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) 10/18/2018   Transaminitis 10/17/2018   Hyperbilirubinemia 10/17/2018   Leukocytosis 10/17/2018   Sinusitis 10/17/2018   Chronic anticoagulation 09/30/2017   Smoker 09/30/2017   Cardiomyopathy (HCC) 09/30/2017   Sleep apnea suspected 09/30/2017   Essential hypertension 09/26/2017   Insulin  dependent type 2 diabetes mellitus (HCC) 09/26/2017   Permanent atrial fibrillation (HCC) 09/26/2017    PCP: Delbert Clam, MD  REFERRING PROVIDER: Kendal Franky SQUIBB, MD  REFERRING DIAG: [phone] (ICD-10-CM) - Status post total hip replacement, left  THERAPY DIAG:  Stiffness of left hip, not elsewhere classified  Muscle weakness (generalized)  Difficulty in walking, not elsewhere classified  Pain in left hip  Unsteadiness on feet  Rationale for Evaluation and Treatment: Rehabilitation  ONSET DATE: 09/26/2023  SUBJECTIVE:   SUBJECTIVE STATEMENT:  I am feeling much better this week.  I am having some pain starting yesterday around the top front of the Lt hip.  EVAL: Patient had series of orthopedic issues. He was in PT for back pain when he was found to have MRSA infection in spine May 2024, surgery followed for fusion/reconstruction, January had CABG, was still on cane once he got home and fell in bathroom and suffered hip fracture on left with subsequent THA. His THA was on 07/14/23. He was in rehab for several weeks and is now home. He felt he did not get aggressive enough PT in rehab. He then had home health and only did this 1 time per week. He did his exercises and is very motivated to get his function back. He is unsure if he will return to work. He is concerned about the physicality of the job with all that he has endured. He states this all depends on how much function he gets back. His goal is to be able to walk without the cane and build some upper body strength to be  able to function at the level he did before and do his job.   PERTINENT HISTORY: Left LE neural impairment since back surgery; right toe amputations See above for extensive medical history  PAIN: 02/12/24 Are you having pain? Yes: NPRS scale: 3/10 Pain location: right hip and left low back Pain description: aching / stiff Aggravating factors: standing > 20 min, weather changes  Relieving factors: moving, Aleve  PRECAUTIONS: Fall  RED FLAGS: None   WEIGHT BEARING RESTRICTIONS: No  FALLS:  Has patient fallen in last 6 months? Yes. Number of falls 1  LIVING ENVIRONMENT: Lives with: lives with their spouse Lives in: House/apartment Stairs: No Has following equipment at home: Single point cane  OCCUPATION: chef  PLOF: Independent, Independent with basic ADLs, Independent with household mobility without device, Independent with community mobility without device, Independent with homemaking with ambulation, Independent with gait, and Independent with transfers  PATIENT GOALS: To be able to walk without the cane and build some upper body strength to be able to function  at the level he did before and do his job.    NEXT MD VISIT: 2 months  OBJECTIVE:  Note: Objective measures were completed at Evaluation unless otherwise noted.  DIAGNOSTIC FINDINGS: na  PATIENT SURVEYS:  Eval:  LEFS : 30/80= 50% 11/08/2023:  Lower Extremity Functional Score: 26 / 80 = 32.5 % 11/28/2023:  Lower Extremity Functional Score: 38 / 80 = 47.5 % 01/08/24: 51/80 = 64%  COGNITION: Overall cognitive status: Within functional limits for tasks assessed     SENSATION: WFL   MUSCLE LENGTH: Hips tight bilaterally in hip flexors, quads and rotation both IR and ER   Left > Right  POSTURE: rounded shoulders, forward head, decreased lumbar lordosis, posterior pelvic tilt, and flexed trunk    LOWER EXTREMITY ROM:  WFL  LOWER EXTREMITY MMT:  MMT Right eval Left eval Left 11/28/23 Left 01/08/24  Hip  flexion  4 4+ 4+  Hip extension  4- 4 4+  Hip abduction  4 4+ 4+  Hip adduction  4 4+ 5  Hip internal rotation  4 4 4+  Hip external rotation  4 4+ 4+  Knee flexion  4 4+ 5  Knee extension  4+ 4+ Unable to control quad eccentrically for descending stairs, open chain testing: 4+/5  Ankle dorsiflexion      Ankle plantarflexion      Ankle inversion      Ankle eversion       (Blank rows = not tested)   FUNCTIONAL TESTS:  Eval: 5 times sit to stand: 15.14 sec (no UE use) Timed up and go (TUG): 14.00 sec  11/28/23: 5 times sit to stand: 11.67 sec (no UE use) Timed up and go (TUG): 9.34 sec  01/08/24: 5 times sit to stand: 9.42 sec (no UE use) TUG: 8.30 sec  01/15/24: 3 min walk test: 442 feet (age related norm for 6 min is 2009 feet)  02/15/24: covered 680' without AD with maintained pace throughout, RPE 6/10 with cardiovascular challenge >> any increase in pain    GAIT: Distance walked: 50 feet Assistive device utilized: Single point cane Level of assistance: SBA Comments: antalgic/guarded/ slow                                                                                                                                TREATMENT DATE:   02/21/24 NuStep L6x6' PT present to discuss status Lt hip flexor stretch in chair 2x30 Seated fig 4 alt between push knee down and pull knee across every 5 sec - initial pain with push knee down but it improved with repeated reps Chair sit up 10 lb 2x20, then russian twists 10lb 2x20 Green tband bil shoulder ext 2x20 Blue tband bil shoulder row 2x20 Leg press bil LE 80lb seat 8: 2x20, Lt only 60# x20, Rt only 70#x20 each  Hip matrix: extension 30# Rt 2x10, Lt 1x10 (pain in Lt low back with this)  02/15/24  NuStep L6x6' PT present to discuss status Lt hip flexor stretch in chair 2x30 Seated fig 4 alt between push knee down and pull knee across every 5 sec - initial pain with push knee down but it improved with repeated reps :  680' no AD - RPE 6/10 cardiovascular challenged more than pain - MET goal Chair sit up 8lb 2x20, then russian twists 8lb 2x20 Green tband bil shoulder ext x20 Blue tband bil shoulder row x20 Leg press bil LE 80lb seat 8: 2x20, Lt only 60# x15, Rt only 70#x15 each   02/12/24 NuStep L6x6' PT present to discuss current status Chair sit up 8lb 2x20, then russian twists 8lb 2x20 Sit to stand chair + pad holding 8lb x10 Reverse walking x10 10lb holding cable pulleys - this challenged his balance more so close supervision but no LOB with all reps Leg press bil LE 80lb seat 8: 2x20, Lt only 60# x15, Rt only 70#x15 each  Lateral step over hurdle tap and back x10 bil UE support Green tband bil shoulder ext x20 Blue tband bil shoulder row x20   ASSESSMENT:   CLINICAL IMPRESSION:  Pt did well with advancement of reps on leg press and with theraband exercises, weight with seated core exercises and added hip Matrix today.  He fatigued appropriately with activity today.  PT monitored throughout session to monitor for form, technique and form.  Patient will benefit from skilled PT to address the below impairments and improve overall function.   PATIENT EDUCATION:  Education details: Initiated HEP Person educated: Patient Education method: Programmer, Multimedia, Facilities Manager, Verbal cues, and Handouts Education comprehension: verbalized understanding and returned demonstration  HOME EXERCISE PROGRAM: Access Code: [email or web address] URL: [email or web address] Date: 11/08/2023 Prepared by: Jarrell Menke  Exercises - Seated Hip Abduction with Resistance  - 1 x daily - 7 x weekly - 1 sets - 20 reps - Seated March with Resistance  - 1 x daily - 7 x weekly - 2 sets - 8 reps - Squat with Chair Touch  - 1 x daily - 7 x weekly - 4 sets - 5 reps - Side Stepping with Resistance at Ankles  - 1 x daily - 7 x weekly - 3 sets - 10 reps - Forward Step Touch  - 2-3 x daily - 7 x weekly - 1 sets - 5-8 reps -  Standing 3-way Hip with Walker  - 2-3 x daily - 7 x weekly - 1 sets - 5-8 reps    OBJECTIVE IMPAIRMENTS: Abnormal gait, cardiopulmonary status limiting activity, decreased activity tolerance, decreased balance, decreased endurance, difficulty walking, decreased ROM, decreased strength, increased fascial restrictions, increased muscle spasms, impaired flexibility, impaired UE functional use, improper body mechanics, postural dysfunction, and pain.   ACTIVITY LIMITATIONS: carrying, lifting, bending, sitting, standing, squatting, sleeping, stairs, transfers, bed mobility, bathing, toileting, dressing, reach over head, hygiene/grooming, and caring for others  PARTICIPATION LIMITATIONS: meal prep, cleaning, laundry, driving, shopping, community activity, occupation, yard work, and church  PERSONAL FACTORS: Fitness, Past/current experiences, Profession, Time since onset of injury/illness/exacerbation, and 3+ comorbidities: spine surgery, CABG, diabetes and Htn are also affecting patient's functional outcome.   REHAB POTENTIAL: Good  CLINICAL DECISION MAKING: Unstable/unpredictable  EVALUATION COMPLEXITY: High   GOALS: Goals reviewed with patient? Yes  SHORT TERM GOALS: Target date: 10/26/2023  Pain report to be no greater than 4/10  Baseline: Goal status: Met on 11/01/23  2.  Patient will be independent with initial HEP  Baseline:  Goal status: Met on 10/18/23  3.  Patient to be able to ambulate 50 feet without a.d Baseline:  Goal status: Met on 11/01/2023  4.  Patient to be able to do 10 sit to stand without UE support  Baseline:  Goal status: Met on 11/08/23    LONG TERM GOALS: Target date: 03/11/2024   Patient to report pain no greater than 2/10  Baseline:  Goal status: Progressing   2.  Patient to be independent with advanced HEP  Baseline:  Goal status: ongoing   3.  Patient to be able to stand or walk for at least 15 min without pain or need to rest Baseline: 1 hour  (01/01/24) Goal status: MET  4.  Patient to be able to ascend and descend 12 steps safely Baseline: max UE support and reduced eccentric control on the Lt (12/25/23) Goal status: Ongoing for descending 01/08/24  5.  Patient to report 85% improvement in overall symptoms and function Baseline:  Goal status: Ongoing, 60-70% 01/08/24  6.  Ambulate > or = to 650 feet in 3 minutes to improve community function  Baseline: 442 feet Goal status:  MET 680' on 11/13   PLAN:  PT FREQUENCY: 1-2x/week  PT DURATION: 8 weeks- 7 visit remain on insurance  PLANNED INTERVENTIONS: 97110-Therapeutic exercises, 97530- Therapeutic activity, 97112- Neuromuscular re-education, [phone]- Self Care, 02859- Manual therapy, [phone]- Gait training, [phone]- Aquatic Therapy, [phone]- Electrical stimulation (unattended), [phone]- Electrical stimulation (manual), [id number]- Vasopneumatic device, 97035- Ultrasound, 02966- Ionotophoresis 4mg /ml Dexamethasone , Patient/Family education, Balance training, Stair training, Taping, Joint mobilization, Spinal mobilization, Cryotherapy, and Moist heat  PLAN FOR NEXT SESSION:  work on eccentric Lt quad strength in closed chain, endurance, strength, gait, standing, 3 more visits remain on insurance approval    Burnard Joy, PT 02/21/24 3:33 PM     [facility] [address], [address] [place], [place] [address] Phone # [phone] Fax [phone]

## 2024-02-27 ENCOUNTER — Other Ambulatory Visit (HOSPITAL_COMMUNITY): Payer: Self-pay

## 2024-02-28 ENCOUNTER — Ambulatory Visit

## 2024-03-04 ENCOUNTER — Ambulatory Visit: Admitting: Family Medicine

## 2024-03-05 ENCOUNTER — Ambulatory Visit

## 2024-03-05 DIAGNOSIS — R262 Difficulty in walking, not elsewhere classified: Secondary | ICD-10-CM | POA: Diagnosis present

## 2024-03-05 DIAGNOSIS — M25552 Pain in left hip: Secondary | ICD-10-CM | POA: Diagnosis present

## 2024-03-05 DIAGNOSIS — M25652 Stiffness of left hip, not elsewhere classified: Secondary | ICD-10-CM | POA: Insufficient documentation

## 2024-03-05 DIAGNOSIS — R293 Abnormal posture: Secondary | ICD-10-CM | POA: Diagnosis present

## 2024-03-05 DIAGNOSIS — M6281 Muscle weakness (generalized): Secondary | ICD-10-CM | POA: Diagnosis present

## 2024-03-05 NOTE — Therapy (Signed)
 OUTPATIENT PHYSICAL THERAPY TREATMENT NOTE   Patient Name: David [name] MRN: [id number] DOB:09-26-1960, 63 y.o., male Today's Date: 03/05/2024  END OF SESSION:  PT End of Session - 03/05/24 1317     Visit Number 25    Date for Recertification  03/11/24    Authorization Type Medicaid no auth 27 visits    Authorization - Visit Number 25    Authorization - Number of Visits 27    PT Start Time 1233    PT Stop Time 1315    PT Time Calculation (min) 42 min    Activity Tolerance Patient tolerated treatment well    Behavior During Therapy WFL for tasks assessed/performed                              Past Medical History:  Diagnosis Date   Cardiomyopathy (HCC)    a. EF 45% in 2019.   CHF (congestive heart failure) (HCC)    Chronic anticoagulation 09/30/2017   Diabetes mellitus type 2 in nonobese [facility])    Does not have health insurance    Essential hypertension 09/26/2017   Financial difficulties    H/O noncompliance with medical treatment, presenting hazards to health    Hardware complicating wound infection 09/25/2022   Non-insulin  treated type 2 diabetes mellitus (HCC) 09/26/2017   Persistent atrial fibrillation (HCC)    Sleep apnea suspected 09/30/2017   years ago- not anymore per patient   Past Surgical History:  Procedure Laterality Date   ABDOMINAL AORTOGRAM W/LOWER EXTREMITY N/A 09/02/2021   Procedure: ABDOMINAL AORTOGRAM W/LOWER EXTREMITY;  Surgeon: Serene Gaile ORN, MD;  [facility]: [facility];  Service: Cardiovascular;  Laterality: N/A;   ABDOMINAL AORTOGRAM W/LOWER EXTREMITY N/A 12/28/2021   Procedure: ABDOMINAL AORTOGRAM W/LOWER EXTREMITY;  Surgeon: Serene Gaile ORN, MD;  Location: [facility];  Service: Cardiovascular;  Laterality: N/A;   ABDOMINAL AORTOGRAM W/LOWER EXTREMITY N/A 07/28/2022   Procedure: ABDOMINAL AORTOGRAM W/LOWER EXTREMITY;  Surgeon: Gretta Lonni PARAS, MD;  [facility]: [facility];  Service:  Cardiovascular;  Laterality: N/A;   AMPUTATION Right 09/04/2021   Procedure: 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;  Surgeon: Harden Jerona GAILS, MD;  Location: [facility] OR;  Service: Orthopedics;  Laterality: Right;   AMPUTATION Right 08/02/2022   Procedure: AMPUTATION RAY 5TH METATARSAL OF RIGHT FOOT;  Surgeon: Barton Drape, MD;  Location: [facility] OR;  Service: Orthopedics;  Laterality: Right;   APPENDECTOMY  1971   BONE BIOPSY Right 08/02/2022   Procedure: BONE BIOPSY OF 5TH METATARSAL RIGHT FOOT;  Surgeon: Barton Drape, MD;  Location: [facility];  Service: Orthopedics;  Laterality: Right;   CARDIOVERSION N/A 09/28/2017   Procedure: CARDIOVERSION;  Surgeon: Francyne Headland, MD;  [facility];  Service: Cardiovascular;  Laterality: N/A;   CLIPPING OF ATRIAL APPENDAGE N/A 04/18/2023   Procedure: CLIPPING OF ATRIAL APPENDAGE USING ATRICURE ATRICLIP [id number] with pulmonary vein isolation ablation;  Surgeon: Lucas Dorise POUR, MD;  Location: [facility] OR;  Service: Open Heart Surgery;  Laterality: N/A;   CORONARY ARTERY BYPASS GRAFT N/A 04/18/2023   Procedure: CORONARY ARTERY BYPASS GRAFTING (CABG) TIMES FOUR USING LEFT INTERNAL MAMMARY ARTERY AND ENDOSCOPICALLY HARVESTED LEFT GREATER SAPHENOUS VEIN;  Surgeon: Lucas Dorise POUR, MD;  [facility]: [facility];  Service: Open Heart Surgery;  Laterality: N/A;   IRRIGATION AND DEBRIDEMENT FOOT  08/02/2022   Procedure: IRRIGATION AND DEBRIDEMENT RIGHT 5TH METATARSAL FOOT;  Surgeon: Barton Drape, MD;  [facility];  Service: Orthopedics;;   LAMINECTOMY WITH POSTERIOR LATERAL ARTHRODESIS LEVEL 4 N/A 08/30/2022   Procedure: THORACIC FIVE-THORACIC ELEVEN FUSION, THORACIC EIGHT TRANSPEDICULAR DECOMPRESSION AND PARTIAL CORPECTOMY with O-Arm;  Surgeon: Debby Dorn MATSU, MD;  Location[facility] OR;  Service: Neurosurgery;  Laterality: N/A;   LEFT HEART CATH AND CORONARY ANGIOGRAPHY N/A 04/12/2023   Procedure: LEFT HEART CATH AND CORONARY ANGIOGRAPHY;  Surgeon: Verlin Lonni BIRCH, MD;   [facility];  Service: Cardiovascular;  Laterality: N/A;   NASAL ENDOSCOPY Right 06/30/2023   Procedure: ENDOSCOPY, NOSE;  Surgeon: Jesus Oliphant, MD;  Location: [facility] OR;  Service: ENT;  Laterality: Right;   PERIPHERAL VASCULAR BALLOON ANGIOPLASTY  09/02/2021   Procedure: PERIPHERAL VASCULAR BALLOON ANGIOPLASTY;  Surgeon: Serene Gaile ORN, MD;  [facility];  Service: Cardiovascular;;   PERIPHERAL VASCULAR BALLOON ANGIOPLASTY  12/28/2021   Procedure: PERIPHERAL VASCULAR BALLOON ANGIOPLASTY;  Surgeon: Serene Gaile ORN, MD;  [facility];  Service: Cardiovascular;;  Posterior Tibial PTA only   POLYPECTOMY Right 06/30/2023   Procedure: POLYPECTOMY, NASAL CAVITY;  Surgeon: Jesus Oliphant, MD;  Location: [facility] OR;  Service: ENT;  Laterality: Right;   TEE WITHOUT CARDIOVERSION N/A 09/28/2017   Procedure: TRANSESOPHAGEAL ECHOCARDIOGRAM (TEE);  Surgeon: Francyne Headland, MD;  Location: [facility] ENDOSCOPY;  Service: Cardiovascular;  Laterality: N/A;   TEE WITHOUT CARDIOVERSION N/A 09/03/2021   Procedure: TRANSESOPHAGEAL ECHOCARDIOGRAM (TEE);  Surgeon: Raford Riggs, MD;  Location: [facility] ENDOSCOPY;  Service: Cardiovascular;  Laterality: N/A;   TEE WITHOUT CARDIOVERSION N/A 07/15/2022   Procedure: TRANSESOPHAGEAL ECHOCARDIOGRAM;  Surgeon: Francyne Headland, MD;  [facility];  Service: Cardiovascular;  Laterality: N/A;   TEE WITHOUT CARDIOVERSION N/A 04/18/2023   Procedure: TRANSESOPHAGEAL ECHOCARDIOGRAM (TEE);  Surgeon: Lucas Dorise POUR, MD;  Location: [facility] OR;  Service: Open Heart Surgery;  Laterality: N/A;   TOTAL HIP ARTHROPLASTY Left 07/14/2023   Procedure: ARTHROPLASTY, HIP, TOTAL, ANTERIOR APPROACH;  Surgeon: Kendal Franky SQUIBB, MD;  Location: [facility];  Service: Orthopedics;  Laterality: Left;   Patient Active Problem List   Diagnosis Date Noted   Left displaced femoral neck fracture (HCC) 07/13/2023   Closed left femoral fracture (HCC) 07/12/2023   History of  CHF (congestive heart failure) 07/12/2023   Chronic kidney disease (CKD), stage III (moderate) (HCC) 07/12/2023   Hyperlipidemia 07/12/2023   BPH (benign prostatic hyperplasia) 07/12/2023   Peripheral neuropathy 07/12/2023   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   Bradycardia 04/14/2023   NSTEMI (non-ST elevated myocardial infarction) (HCC) 04/13/2023   Chronic bilateral low back pain 04/13/2023   History of CABG in  04/2023 04/12/2023   Acute decompensated heart failure (HCC) 04/10/2023   Chronic pain syndrome 04/09/2023   MRSA infection greater than 3 months ago 04/09/2023   Hypoglycemia 10/06/2022   Pleural effusion 09/29/2022   Chronic systolic CHF (congestive heart failure) (HCC) 09/29/2022   Acute urinary retention 09/29/2022   Anxiety 09/29/2022   Hardware complicating wound infection 09/25/2022   Discitis of thoracic region 09/25/2022   Hypokalemia 09/07/2022   Acute toxic encephalopathy 09/07/2022   Acute postoperative anemia due to expected blood loss 09/07/2022   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   Adjustment disorder with mixed anxiety and depressed mood 08/13/2022   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 08/08/2022   MRSA bacteremia 08/08/2022   AKI (acute kidney injury), ruled out 07/27/2022   Discitis thoracic region 07/27/2022   Bacteremia due to methicillin resistant Staphylococcus aureus 07/27/2022   Severe protein-calorie malnutrition 07/26/2022  PVD (peripheral vascular disease) 07/26/2022   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   Bacteremia 07/15/2022   Atelectasis 07/12/2022   Sepsis due to pneumonia (HCC) 07/12/2022   Acute on chronic diastolic (congestive) heart failure (HCC) 07/12/2022   Gangrene of toe of right foot (HCC)    Sepsis (HCC) 08/29/2021   SIRS (systemic inflammatory response syndrome) (HCC) 08/29/2021   Hyponatremia 08/29/2021   Hypomagnesemia 08/29/2021   Pure hypercholesterolemia 06/17/2021   Pain of left hand 09/06/2019   Cellulitis 08/26/2019    Persistent atrial fibrillation (HCC) 08/26/2019   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) 10/18/2018   Transaminitis 10/17/2018   Hyperbilirubinemia 10/17/2018   Leukocytosis 10/17/2018   Sinusitis 10/17/2018   Chronic anticoagulation 09/30/2017   Smoker 09/30/2017   Cardiomyopathy (HCC) 09/30/2017   Sleep apnea suspected 09/30/2017   Essential hypertension 09/26/2017   Insulin  dependent type 2 diabetes mellitus (HCC) 09/26/2017   Permanent atrial fibrillation (HCC) 09/26/2017    PCP: Delbert Clam, MD  REFERRING PROVIDER: Kendal Franky SQUIBB, MD  REFERRING DIAG: [phone] (ICD-10-CM) - Status post total hip replacement, left  THERAPY DIAG:  Stiffness of left hip, not elsewhere classified  Muscle weakness (generalized)  Difficulty in walking, not elsewhere classified  Abnormal posture  Rationale for Evaluation and Treatment: Rehabilitation  ONSET DATE: 09/26/2023  SUBJECTIVE:   SUBJECTIVE STATEMENT:  I am feeling much better this week.  I am having some pain starting yesterday around the top front of the Lt hip.  EVAL: Patient had series of orthopedic issues. He was in PT for back pain when he was found to have MRSA infection in spine May 2024, surgery followed for fusion/reconstruction, January had CABG, was still on cane once he got home and fell in bathroom and suffered hip fracture on left with subsequent THA. His THA was on 07/14/23. He was in rehab for several weeks and is now home. He felt he did not get aggressive enough PT in rehab. He then had home health and only did this 1 time per week. He did his exercises and is very motivated to get his function back. He is unsure if he will return to work. He is concerned about the physicality of the job with all that he has endured. He states this all depends on how much function he gets back. His goal is to be able to walk without the cane and build some upper body strength to be able to function at the  level he did before and do his job.   PERTINENT HISTORY: Left LE neural impairment since back surgery; right toe amputations See above for extensive medical history  PAIN: 02/12/24 Are you having pain? Yes: NPRS scale: 3/10 Pain location: right hip and left low back Pain description: aching / stiff Aggravating factors: standing > 20 min, weather changes  Relieving factors: moving, Aleve  PRECAUTIONS: Fall  RED FLAGS: None   WEIGHT BEARING RESTRICTIONS: No  FALLS:  Has patient fallen in last 6 months? Yes. Number of falls 1  LIVING ENVIRONMENT: Lives with: lives with their spouse Lives in: House/apartment Stairs: No Has following equipment at home: Single point cane  OCCUPATION: chef  PLOF: Independent, Independent with basic ADLs, Independent with household mobility without device, Independent with community mobility without device, Independent with homemaking with ambulation, Independent with gait, and Independent with transfers  PATIENT GOALS: To be able to walk without the cane and build some upper body strength to be able to function at the level he did before  and do his job.    NEXT MD VISIT: 2 months  OBJECTIVE:  Note: Objective measures were completed at Evaluation unless otherwise noted.  DIAGNOSTIC FINDINGS: na  PATIENT SURVEYS:  Eval:  LEFS : 30/80= 50% 11/08/2023:  Lower Extremity Functional Score: 26 / 80 = 32.5 % 11/28/2023:  Lower Extremity Functional Score: 38 / 80 = 47.5 % 01/08/24: 51/80 = 64%  COGNITION: Overall cognitive status: Within functional limits for tasks assessed     SENSATION: WFL   MUSCLE LENGTH: Hips tight bilaterally in hip flexors, quads and rotation both IR and ER   Left > Right  POSTURE: rounded shoulders, forward head, decreased lumbar lordosis, posterior pelvic tilt, and flexed trunk    LOWER EXTREMITY ROM:  WFL  LOWER EXTREMITY MMT:  MMT Right eval Left eval Left 11/28/23 Left 01/08/24  Hip flexion  4 4+ 4+  Hip  extension  4- 4 4+  Hip abduction  4 4+ 4+  Hip adduction  4 4+ 5  Hip internal rotation  4 4 4+  Hip external rotation  4 4+ 4+  Knee flexion  4 4+ 5  Knee extension  4+ 4+ Unable to control quad eccentrically for descending stairs, open chain testing: 4+/5  Ankle dorsiflexion      Ankle plantarflexion      Ankle inversion      Ankle eversion       (Blank rows = not tested)   FUNCTIONAL TESTS:  Eval: 5 times sit to stand: 15.14 sec (no UE use) Timed up and go (TUG): 14.00 sec  11/28/23: 5 times sit to stand: 11.67 sec (no UE use) Timed up and go (TUG): 9.34 sec  01/08/24: 5 times sit to stand: 9.42 sec (no UE use) TUG: 8.30 sec  01/15/24: 3 min walk test: 442 feet (age related norm for 6 min is 2009 feet)  02/15/24: covered 680' without AD with maintained pace throughout, RPE 6/10 with cardiovascular challenge >> any increase in pain   GAIT: Distance walked: 50 feet Assistive device utilized: Single point cane Level of assistance: SBA Comments: antalgic/guarded/ slow                                                                                                                                TREATMENT DATE:   03/05/24 NuStep L6x6' PT present to discuss status Lt hip flexor stretch in chair 2x30 Seated fig 4 alt between push knee down and pull knee across every 5 sec - initial pain with push knee down but it improved with repeated reps Chair sit up 10 lb 2x20, then russian twists 10lb 2x20 blue tband bil shoulder ext 2x20 Blue tband bil shoulder row 2x20 Leg press bil LE 80lb seat 8: 2x20, Lt only 60# x20, Rt only 70#x20 each  Hip matrix: extension 30# Rt 2x10, Lt 1x10 (no pain in Lt hip today)   02/21/24 NuStep L6x6' PT present to discuss status  Lt hip flexor stretch in chair 2x30 Seated fig 4 alt between push knee down and pull knee across every 5 sec - initial pain with push knee down but it improved with repeated reps Chair sit up 10 lb 2x20, then russian  twists 10lb 2x20 Green tband bil shoulder ext 2x20 Blue tband bil shoulder row 2x20 Leg press bil LE 80lb seat 8: 2x20, Lt only 60# x20, Rt only 70#x20 each  Hip matrix: extension 30# Rt 2x10, Lt 1x10 (pain in Lt low back with this)  02/15/24 NuStep L6x6' PT present to discuss status Lt hip flexor stretch in chair 2x30 Seated fig 4 alt between push knee down and pull knee across every 5 sec - initial pain with push knee down but it improved with repeated reps : 680' no AD - RPE 6/10 cardiovascular challenged more than pain - MET goal Chair sit up 8lb 2x20, then russian twists 8lb 2x20 Green tband bil shoulder ext x20 Blue tband bil shoulder row x20 Leg press bil LE 80lb seat 8: 2x20, Lt only 60# x15, Rt only 70#x15 each     ASSESSMENT:   CLINICAL IMPRESSION:  Pt is able to stand and walk ~20-30 minutes and is limited by Lt hip/lumbar pain.  He is able to ascend and descend steps with reciprocal pattern and min UE support.   He has been walking regularly at home.   He requires brief rest due to fatigue and PT monitored throughout session.  No Lt hip/lumbar pain with hip extension today.  One more session probable due to insurance limits.    PATIENT EDUCATION:  Education details: Initiated HEP Person educated: Patient Education method: Programmer, Multimedia, Facilities Manager, Verbal cues, and Handouts Education comprehension: verbalized understanding and returned demonstration  HOME EXERCISE PROGRAM: Access Code: [email or web address] URL: [email or web address] Date: 11/08/2023 Prepared by: Jarrell Menke  Exercises - Seated Hip Abduction with Resistance  - 1 x daily - 7 x weekly - 1 sets - 20 reps - Seated March with Resistance  - 1 x daily - 7 x weekly - 2 sets - 8 reps - Squat with Chair Touch  - 1 x daily - 7 x weekly - 4 sets - 5 reps - Side Stepping with Resistance at Ankles  - 1 x daily - 7 x weekly - 3 sets - 10 reps - Forward Step Touch  - 2-3 x daily - 7 x weekly - 1 sets -  5-8 reps - Standing 3-way Hip with Walker  - 2-3 x daily - 7 x weekly - 1 sets - 5-8 reps    OBJECTIVE IMPAIRMENTS: Abnormal gait, cardiopulmonary status limiting activity, decreased activity tolerance, decreased balance, decreased endurance, difficulty walking, decreased ROM, decreased strength, increased fascial restrictions, increased muscle spasms, impaired flexibility, impaired UE functional use, improper body mechanics, postural dysfunction, and pain.   ACTIVITY LIMITATIONS: carrying, lifting, bending, sitting, standing, squatting, sleeping, stairs, transfers, bed mobility, bathing, toileting, dressing, reach over head, hygiene/grooming, and caring for others  PARTICIPATION LIMITATIONS: meal prep, cleaning, laundry, driving, shopping, community activity, occupation, yard work, and church  PERSONAL FACTORS: Fitness, Past/current experiences, Profession, Time since onset of injury/illness/exacerbation, and 3+ comorbidities: spine surgery, CABG, diabetes and Htn are also affecting patient's functional outcome.   REHAB POTENTIAL: Good  CLINICAL DECISION MAKING: Unstable/unpredictable  EVALUATION COMPLEXITY: High   GOALS: Goals reviewed with patient? Yes  SHORT TERM GOALS: Target date: 10/26/2023  Pain report to be no greater than 4/10  Baseline: Goal status: Met on 11/01/23  2.  Patient will be independent with initial HEP  Baseline:  Goal status: Met on 10/18/23  3.  Patient to be able to ambulate 50 feet without a.d Baseline:  Goal status: Met on 11/01/2023  4.  Patient to be able to do 10 sit to stand without UE support  Baseline:  Goal status: Met on 11/08/23    LONG TERM GOALS: Target date: 03/11/2024   Patient to report pain no greater than 2/10  Baseline:  Goal status: Progressing   2.  Patient to be independent with advanced HEP  Baseline:  Goal status: ongoing   3.  Patient to be able to stand or walk for at least 15 min without pain or need to  rest Baseline: standing 20-30 min, walk 30 min (03/05/24) Goal status: MET  4.  Patient to be able to ascend and descend 12 steps safely Baseline: min to moderate UE support-03/05/24 with reciprocal pattern Goal status: MET  5.  Patient to report 85% improvement in overall symptoms and function Baseline:  Goal status: Ongoing, 60-70% 01/08/24  6.  Ambulate > or = to 650 feet in 3 minutes to improve community function  Baseline: 442 feet Goal status:  MET 680' on 11/13   PLAN:  PT FREQUENCY: 1-2x/week  PT DURATION: 8 weeks- 7 visit remain on insurance  PLANNED INTERVENTIONS: 97110-Therapeutic exercises, 97530- Therapeutic activity, 97112- Neuromuscular re-education, [phone]- Self Care, [id number]- Manual therapy, [phone]- Gait training, [phone]- Aquatic Therapy, [phone]- Electrical stimulation (unattended), [phone]- Electrical stimulation (manual), [id number]- Vasopneumatic device, [id number]- Ultrasound, [id number]- Ionotophoresis 4mg /ml Dexamethasone , Patient/Family education, Balance training, Stair training, Taping, Joint mobilization, Spinal mobilization, Cryotherapy, and Moist heat  PLAN FOR NEXT SESSION:  work on eccentric Lt quad strength in closed chain, endurance, strength, gait, standing, 1 more visit remains on insurance approval    Burnard Joy, PT 03/05/24 1:19 PM    [facility] [address] [place], [place] [address] Phone # [phone] Fax [phone]

## 2024-03-06 ENCOUNTER — Other Ambulatory Visit: Payer: Self-pay | Admitting: Internal Medicine

## 2024-03-06 ENCOUNTER — Ambulatory Visit

## 2024-03-06 ENCOUNTER — Other Ambulatory Visit: Payer: Self-pay | Admitting: Family Medicine

## 2024-03-06 DIAGNOSIS — M4644 Discitis, unspecified, thoracic region: Secondary | ICD-10-CM

## 2024-03-06 DIAGNOSIS — R293 Abnormal posture: Secondary | ICD-10-CM

## 2024-03-06 DIAGNOSIS — R262 Difficulty in walking, not elsewhere classified: Secondary | ICD-10-CM

## 2024-03-06 DIAGNOSIS — M25652 Stiffness of left hip, not elsewhere classified: Secondary | ICD-10-CM | POA: Diagnosis not present

## 2024-03-06 DIAGNOSIS — M25552 Pain in left hip: Secondary | ICD-10-CM

## 2024-03-06 DIAGNOSIS — M6281 Muscle weakness (generalized): Secondary | ICD-10-CM

## 2024-03-06 MED ORDER — FAMOTIDINE 20 MG PO TABS
20.0000 mg | ORAL_TABLET | Freq: Two times a day (BID) | ORAL | 0 refills | Status: DC
  Filled 2024-03-06: qty 60, 30d supply, fill #0

## 2024-03-06 MED ORDER — POTASSIUM CHLORIDE ER 20 MEQ PO TBCR
20.0000 meq | EXTENDED_RELEASE_TABLET | Freq: Every day | ORAL | 0 refills | Status: AC
  Filled 2024-03-06 – 2024-03-20 (×3): qty 30, 30d supply, fill #0

## 2024-03-06 MED ORDER — PREGABALIN 75 MG PO CAPS
75.0000 mg | ORAL_CAPSULE | Freq: Two times a day (BID) | ORAL | 1 refills | Status: AC
  Filled 2024-03-06: qty 60, 30d supply, fill #0
  Filled 2024-04-03: qty 60, 30d supply, fill #1

## 2024-03-06 NOTE — Therapy (Signed)
 OUTPATIENT PHYSICAL THERAPY TREATMENT NOTE   Patient Name: David [name] MRN: [id number] DOB:12-30-1960, 64 y.o., male Today's Date: 03/06/2024  END OF SESSION:  PT End of Session - 03/06/24 0944     Visit Number 26    Date for Recertification  03/11/24    Authorization Type Medicaid no auth 27 visits    Authorization - Visit Number 26    Authorization - Number of Visits 27    PT Start Time 0848    PT Stop Time 0929    PT Time Calculation (min) 41 min    Activity Tolerance Patient tolerated treatment well    Behavior During Therapy WFL for tasks assessed/performed                               Past Medical History:  Diagnosis Date   Cardiomyopathy (HCC)    a. EF 45% in 2019.   CHF (congestive heart failure) (HCC)    Chronic anticoagulation 09/30/2017   Diabetes mellitus type 2 in nonobese [facility])    Does not have health insurance    Essential hypertension 09/26/2017   Financial difficulties    H/O noncompliance with medical treatment, presenting hazards to health    Hardware complicating wound infection 09/25/2022   Non-insulin  treated type 2 diabetes mellitus (HCC) 09/26/2017   Persistent atrial fibrillation (HCC)    Sleep apnea suspected 09/30/2017   years ago- not anymore per patient   Past Surgical History:  Procedure Laterality Date   ABDOMINAL AORTOGRAM W/LOWER EXTREMITY N/A 09/02/2021   Procedure: ABDOMINAL AORTOGRAM W/LOWER EXTREMITY;  Surgeon: Serene Gaile ORN, MD;  [facility];  Service: Cardiovascular;  Laterality: N/A;   ABDOMINAL AORTOGRAM W/LOWER EXTREMITY N/A 12/28/2021   Procedure: ABDOMINAL AORTOGRAM W/LOWER EXTREMITY;  Surgeon: Serene Gaile ORN, MD;  [facility]: [facility];  Service: Cardiovascular;  Laterality: N/A;   ABDOMINAL AORTOGRAM W/LOWER EXTREMITY N/A 07/28/2022   Procedure: ABDOMINAL AORTOGRAM W/LOWER EXTREMITY;  Surgeon: Gretta Lonni PARAS, MD;  [facility]: [facility];  Service:  Cardiovascular;  Laterality: N/A;   AMPUTATION Right 09/04/2021   Procedure: 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;  Surgeon: Harden Jerona GAILS, MD;  Location: [facility] OR;  Service: Orthopedics;  Laterality: Right;   AMPUTATION Right 08/02/2022   Procedure: AMPUTATION RAY 5TH METATARSAL OF RIGHT FOOT;  Surgeon: Barton Drape, MD;  Location: [facility];  Service: Orthopedics;  Laterality: Right;   APPENDECTOMY  1971   BONE BIOPSY Right 08/02/2022   Procedure: BONE BIOPSY OF 5TH METATARSAL RIGHT FOOT;  Surgeon: Barton Drape, MD;  [facility];  Service: Orthopedics;  Laterality: Right;   CARDIOVERSION N/A 09/28/2017   Procedure: CARDIOVERSION;  Surgeon: Francyne Headland, MD;  [facility];  Service: Cardiovascular;  Laterality: N/A;   CLIPPING OF ATRIAL APPENDAGE N/A 04/18/2023   Procedure: CLIPPING OF ATRIAL APPENDAGE USING ATRICURE ATRICLIP [id number] with pulmonary vein isolation ablation;  Surgeon: Lucas Dorise POUR, MD;  Location[facility];  Service: Open Heart Surgery;  Laterality: N/A;   CORONARY ARTERY BYPASS GRAFT N/A 04/18/2023   Procedure: CORONARY ARTERY BYPASS GRAFTING (CABG) TIMES FOUR USING LEFT INTERNAL MAMMARY ARTERY AND ENDOSCOPICALLY HARVESTED LEFT GREATER SAPHENOUS VEIN;  Surgeon: Lucas Dorise POUR, MD;  [facility];  Service: Open Heart Surgery;  Laterality: N/A;   IRRIGATION AND DEBRIDEMENT FOOT  08/02/2022   Procedure: IRRIGATION AND DEBRIDEMENT RIGHT 5TH METATARSAL FOOT;  Surgeon: Barton Drape, MD;  Location: [facility]  OR;  Service: Orthopedics;;   LAMINECTOMY WITH POSTERIOR LATERAL ARTHRODESIS LEVEL 4 N/A 08/30/2022   Procedure: THORACIC FIVE-THORACIC ELEVEN FUSION, THORACIC EIGHT TRANSPEDICULAR DECOMPRESSION AND PARTIAL CORPECTOMY with O-Arm;  Surgeon: Debby Dorn MATSU, MD;  Location: [facility] OR;  Service: Neurosurgery;  Laterality: N/A;   LEFT HEART CATH AND CORONARY ANGIOGRAPHY N/A 04/12/2023   Procedure: LEFT HEART CATH AND CORONARY ANGIOGRAPHY;  Surgeon: Verlin Lonni BIRCH, MD;   [facility];  Service: Cardiovascular;  Laterality: N/A;   NASAL ENDOSCOPY Right 06/30/2023   Procedure: ENDOSCOPY, NOSE;  Surgeon: Jesus Oliphant, MD;  Location: [facility] OR;  Service: ENT;  Laterality: Right;   PERIPHERAL VASCULAR BALLOON ANGIOPLASTY  09/02/2021   Procedure: PERIPHERAL VASCULAR BALLOON ANGIOPLASTY;  Surgeon: Serene Gaile ORN, MD;  [facility];  Service: Cardiovascular;;   PERIPHERAL VASCULAR BALLOON ANGIOPLASTY  12/28/2021   Procedure: PERIPHERAL VASCULAR BALLOON ANGIOPLASTY;  Surgeon: Serene Gaile ORN, MD;  [facility];  Service: Cardiovascular;;  Posterior Tibial PTA only   POLYPECTOMY Right 06/30/2023   Procedure: POLYPECTOMY, NASAL CAVITY;  Surgeon: Jesus Oliphant, MD;  Location: [facility] OR;  Service: ENT;  Laterality: Right;   TEE WITHOUT CARDIOVERSION N/A 09/28/2017   Procedure: TRANSESOPHAGEAL ECHOCARDIOGRAM (TEE);  Surgeon: Francyne Headland, MD;  Location: [facility] ENDOSCOPY;  Service: Cardiovascular;  Laterality: N/A;   TEE WITHOUT CARDIOVERSION N/A 09/03/2021   Procedure: TRANSESOPHAGEAL ECHOCARDIOGRAM (TEE);  Surgeon: Raford Riggs, MD;  Location: [facility] ENDOSCOPY;  Service: Cardiovascular;  Laterality: N/A;   TEE WITHOUT CARDIOVERSION N/A 07/15/2022   Procedure: TRANSESOPHAGEAL ECHOCARDIOGRAM;  Surgeon: Francyne Headland, MD;  [facility];  Service: Cardiovascular;  Laterality: N/A;   TEE WITHOUT CARDIOVERSION N/A 04/18/2023   Procedure: TRANSESOPHAGEAL ECHOCARDIOGRAM (TEE);  Surgeon: Lucas Dorise POUR, MD;  Location: [facility] OR;  Service: Open Heart Surgery;  Laterality: N/A;   TOTAL HIP ARTHROPLASTY Left 07/14/2023   Procedure: ARTHROPLASTY, HIP, TOTAL, ANTERIOR APPROACH;  Surgeon: Kendal Franky SQUIBB, MD;  [facility];  Service: Orthopedics;  Laterality: Left;   Patient Active Problem List   Diagnosis Date Noted   Left displaced femoral neck fracture (HCC) 07/13/2023   Closed left femoral fracture (HCC) 07/12/2023   History of  CHF (congestive heart failure) 07/12/2023   Chronic kidney disease (CKD), stage III (moderate) (HCC) 07/12/2023   Hyperlipidemia 07/12/2023   BPH (benign prostatic hyperplasia) 07/12/2023   Peripheral neuropathy 07/12/2023   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   Bradycardia 04/14/2023   NSTEMI (non-ST elevated myocardial infarction) (HCC) 04/13/2023   Chronic bilateral low back pain 04/13/2023   History of CABG in  04/2023 04/12/2023   Acute decompensated heart failure (HCC) 04/10/2023   Chronic pain syndrome 04/09/2023   MRSA infection greater than 3 months ago 04/09/2023   Hypoglycemia 10/06/2022   Pleural effusion 09/29/2022   Chronic systolic CHF (congestive heart failure) (HCC) 09/29/2022   Acute urinary retention 09/29/2022   Anxiety 09/29/2022   Hardware complicating wound infection 09/25/2022   Discitis of thoracic region 09/25/2022   Hypokalemia 09/07/2022   Acute toxic encephalopathy 09/07/2022   Acute postoperative anemia due to expected blood loss 09/07/2022   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   Adjustment disorder with mixed anxiety and depressed mood 08/13/2022   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 08/08/2022   MRSA bacteremia 08/08/2022   AKI (acute kidney injury), ruled out 07/27/2022   Discitis thoracic region 07/27/2022   Bacteremia due to methicillin resistant Staphylococcus aureus 07/27/2022   Severe protein-calorie malnutrition 07/26/2022  PVD (peripheral vascular disease) 07/26/2022   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   Bacteremia 07/15/2022   Atelectasis 07/12/2022   Sepsis due to pneumonia (HCC) 07/12/2022   Acute on chronic diastolic (congestive) heart failure (HCC) 07/12/2022   Gangrene of toe of right foot (HCC)    Sepsis (HCC) 08/29/2021   SIRS (systemic inflammatory response syndrome) (HCC) 08/29/2021   Hyponatremia 08/29/2021   Hypomagnesemia 08/29/2021   Pure hypercholesterolemia 06/17/2021   Pain of left hand 09/06/2019   Cellulitis 08/26/2019    Persistent atrial fibrillation (HCC) 08/26/2019   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) 10/18/2018   Transaminitis 10/17/2018   Hyperbilirubinemia 10/17/2018   Leukocytosis 10/17/2018   Sinusitis 10/17/2018   Chronic anticoagulation 09/30/2017   Smoker 09/30/2017   Cardiomyopathy (HCC) 09/30/2017   Sleep apnea suspected 09/30/2017   Essential hypertension 09/26/2017   Insulin  dependent type 2 diabetes mellitus (HCC) 09/26/2017   Permanent atrial fibrillation (HCC) 09/26/2017    PCP: Delbert Clam, MD  REFERRING PROVIDER: Kendal Franky SQUIBB, MD  REFERRING DIAG: [phone] (ICD-10-CM) - Status post total hip replacement, left  THERAPY DIAG:  Stiffness of left hip, not elsewhere classified  Muscle weakness (generalized)  Difficulty in walking, not elsewhere classified  Abnormal posture  Pain in left hip  Rationale for Evaluation and Treatment: Rehabilitation  ONSET DATE: 09/26/2023  SUBJECTIVE:   SUBJECTIVE STATEMENT:  I am feeling much better this week.  I am having some pain starting yesterday around the top front of the Lt hip.  EVAL: Patient had series of orthopedic issues. He was in PT for back pain when he was found to have MRSA infection in spine May 2024, surgery followed for fusion/reconstruction, January had CABG, was still on cane once he got home and fell in bathroom and suffered hip fracture on left with subsequent THA. His THA was on 07/14/23. He was in rehab for several weeks and is now home. He felt he did not get aggressive enough PT in rehab. He then had home health and only did this 1 time per week. He did his exercises and is very motivated to get his function back. He is unsure if he will return to work. He is concerned about the physicality of the job with all that he has endured. He states this all depends on how much function he gets back. His goal is to be able to walk without the cane and build some upper body strength to be able  to function at the level he did before and do his job.   PERTINENT HISTORY: Left LE neural impairment since back surgery; right toe amputations See above for extensive medical history  PAIN: 02/12/24 Are you having pain? Yes: NPRS scale: 3/10 Pain location: right hip and left low back Pain description: aching / stiff Aggravating factors: standing > 20 min, weather changes  Relieving factors: moving, Aleve  PRECAUTIONS: Fall  RED FLAGS: None   WEIGHT BEARING RESTRICTIONS: No  FALLS:  Has patient fallen in last 6 months? Yes. Number of falls 1  LIVING ENVIRONMENT: Lives with: lives with their spouse Lives in: House/apartment Stairs: No Has following equipment at home: Single point cane  OCCUPATION: chef  PLOF: Independent, Independent with basic ADLs, Independent with household mobility without device, Independent with community mobility without device, Independent with homemaking with ambulation, Independent with gait, and Independent with transfers  PATIENT GOALS: To be able to walk without the cane and build some upper body strength to be able to function at  the level he did before and do his job.    NEXT MD VISIT: 2 months  OBJECTIVE:  Note: Objective measures were completed at Evaluation unless otherwise noted.  DIAGNOSTIC FINDINGS: na  PATIENT SURVEYS:  Eval:  LEFS : 30/80= 50% 11/08/2023:  Lower Extremity Functional Score: 26 / 80 = 32.5 % 11/28/2023:  Lower Extremity Functional Score: 38 / 80 = 47.5 % 01/08/24: 51/80 = 64%  COGNITION: Overall cognitive status: Within functional limits for tasks assessed     SENSATION: WFL   MUSCLE LENGTH: Hips tight bilaterally in hip flexors, quads and rotation both IR and ER   Left > Right  POSTURE: rounded shoulders, forward head, decreased lumbar lordosis, posterior pelvic tilt, and flexed trunk    LOWER EXTREMITY ROM:  WFL  LOWER EXTREMITY MMT:  MMT Right eval Left eval Left 11/28/23 Left 01/08/24  Hip  flexion  4 4+ 4+  Hip extension  4- 4 4+  Hip abduction  4 4+ 4+  Hip adduction  4 4+ 5  Hip internal rotation  4 4 4+  Hip external rotation  4 4+ 4+  Knee flexion  4 4+ 5  Knee extension  4+ 4+ Unable to control quad eccentrically for descending stairs, open chain testing: 4+/5  Ankle dorsiflexion      Ankle plantarflexion      Ankle inversion      Ankle eversion       (Blank rows = not tested)   FUNCTIONAL TESTS:  Eval: 5 times sit to stand: 15.14 sec (no UE use) Timed up and go (TUG): 14.00 sec  11/28/23: 5 times sit to stand: 11.67 sec (no UE use) Timed up and go (TUG): 9.34 sec  01/08/24: 5 times sit to stand: 9.42 sec (no UE use) TUG: 8.30 sec  01/15/24: 3 min walk test: 442 feet (age related norm for 6 min is 2009 feet)  02/15/24: covered 680' without AD with maintained pace throughout, RPE 6/10 with cardiovascular challenge >> any increase in pain    03/06/24: 6 minute walk test: 1061 ft with 6/10 RPE  GAIT: Distance walked: 50 feet Assistive device utilized: Single point cane Level of assistance: SBA Comments: antalgic/guarded/ slow                                                                                                                                TREATMENT DATE:   03/06/24 6 min walk test -1061 feet Lt hip flexor stretch in chair 2x30 Seated fig 4 alt between push knee down and pull knee across every 5 sec - initial pain with push knee down but it improved with repeated reps Chair sit up 10 lb 2x20, then russian twists 10lb 2x20 Blue tband bil shoulder ext 2x20 Blue tband bil shoulder row 2x20 Leg press bil LE 80lb seat 8: 2x20, Lt only 60# x20, Rt only 70#x20 each    03/05/24 NuStep L6x6' PT present to  discuss status Lt hip flexor stretch in chair 2x30 Seated fig 4 alt between push knee down and pull knee across every 5 sec - initial pain with push knee down but it improved with repeated reps Chair sit up 10 lb 2x20, then russian twists  10lb 2x20 Blue tband bil shoulder ext 2x20 Blue tband bil shoulder row 2x20 Leg press bil LE 80lb seat 8: 2x20, Lt only 60# x20, Rt only 70#x20 each  Hip matrix: extension 30# Rt 2x10, Lt 2x10 (no pain in Lt low back with this)   02/21/24 NuStep L6x6' PT present to discuss status Lt hip flexor stretch in chair 2x30 Seated fig 4 alt between push knee down and pull knee across every 5 sec - initial pain with push knee down but it improved with repeated reps Chair sit up 10 lb 2x20, then russian twists 10lb 2x20 Green tband bil shoulder ext 2x20 Blue tband bil shoulder row 2x20 Leg press bil LE 80lb seat 8: 2x20, Lt only 60# x20, Rt only 70#x20 each  Hip matrix: extension 30# Rt 2x10, Lt 1x10 (pain in Lt low back with this)   ASSESSMENT:   CLINICAL IMPRESSION:  Pt is able to stand and walk ~20-30 minutes and is limited by Lt hip/lumbar pain.  He is able to ascend and descend steps with reciprocal pattern and min UE support.   He has been walking regularly at home.   6 min walk test is 1061 feet without use of cane and age related norm is 2022 feet.  RPE of 5-6/10.  He will continue to exercise at home and will likely resume PT when visits reset at the beginning of the year.     PATIENT EDUCATION:  Education details: Initiated HEP Person educated: Patient Education method: Programmer, Multimedia, Facilities Manager, Verbal cues, and Handouts Education comprehension: verbalized understanding and returned demonstration  HOME EXERCISE PROGRAM: Access Code: [email or web address] URL: [email or web address] Date: 11/08/2023 Prepared by: Jarrell Menke  Exercises - Seated Hip Abduction with Resistance  - 1 x daily - 7 x weekly - 1 sets - 20 reps - Seated March with Resistance  - 1 x daily - 7 x weekly - 2 sets - 8 reps - Squat with Chair Touch  - 1 x daily - 7 x weekly - 4 sets - 5 reps - Side Stepping with Resistance at Ankles  - 1 x daily - 7 x weekly - 3 sets - 10 reps - Forward Step Touch  - 2-3 x  daily - 7 x weekly - 1 sets - 5-8 reps - Standing 3-way Hip with Walker  - 2-3 x daily - 7 x weekly - 1 sets - 5-8 reps    OBJECTIVE IMPAIRMENTS: Abnormal gait, cardiopulmonary status limiting activity, decreased activity tolerance, decreased balance, decreased endurance, difficulty walking, decreased ROM, decreased strength, increased fascial restrictions, increased muscle spasms, impaired flexibility, impaired UE functional use, improper body mechanics, postural dysfunction, and pain.   ACTIVITY LIMITATIONS: carrying, lifting, bending, sitting, standing, squatting, sleeping, stairs, transfers, bed mobility, bathing, toileting, dressing, reach over head, hygiene/grooming, and caring for others  PARTICIPATION LIMITATIONS: meal prep, cleaning, laundry, driving, shopping, community activity, occupation, yard work, and church  PERSONAL FACTORS: Fitness, Past/current experiences, Profession, Time since onset of injury/illness/exacerbation, and 3+ comorbidities: spine surgery, CABG, diabetes and Htn are also affecting patient's functional outcome.   REHAB POTENTIAL: Good  CLINICAL DECISION MAKING: Unstable/unpredictable  EVALUATION COMPLEXITY: High   GOALS: Goals reviewed with patient? Yes  SHORT TERM GOALS: Target date: 10/26/2023  Pain report to be no greater than 4/10  Baseline: Goal status: Met on 11/01/23  2.  Patient will be independent with initial HEP  Baseline:  Goal status: Met on 10/18/23  3.  Patient to be able to ambulate 50 feet without a.d Baseline:  Goal status: Met on 11/01/2023  4.  Patient to be able to do 10 sit to stand without UE support  Baseline:  Goal status: Met on 11/08/23    LONG TERM GOALS: Target date: 03/11/2024   Patient to report pain no greater than 2/10  Baseline:  Goal status: Not met  2.  Patient to be independent with advanced HEP  Baseline:  Goal status: MET  3.  Patient to be able to stand or walk for at least 15 min without pain or  need to rest Baseline: standing 20-30 min, walk 30 min (03/05/24) Goal status: MET  4.  Patient to be able to ascend and descend 12 steps safely Baseline: min to moderate UE support-03/05/24 with reciprocal pattern Goal status: MET  5.  Patient to report 85% improvement in overall symptoms and function Baseline:  Goal status: Ongoing, 60-70% 01/08/24  6.  Ambulate > or = to 650 feet in 3 minutes to improve community function  Baseline: 1061 feet in 6 min (03/06/24) Goal status:  MET 680' on 11/13   PLAN:  PHYSICAL THERAPY DISCHARGE SUMMARY  Visits from Start of Care: 26  Current functional level related to goals / functional outcomes: See above- limited functional strength and stability secondary to chronic condition,  Pt will continue to exercise at home and will benefit from PT when his insurance visits reset.    Remaining deficits: Instability, balance deficits, functional strength deficits.    Education / Equipment: HEP   Patient agrees to discharge. Patient goals were met. Patient is being discharged due to financial reasons.     Burnard Joy, PT 03/06/24 9:46 AM    [facility] [address] [place], [place] [address] Phone # [phone] Fax [phone]

## 2024-03-07 ENCOUNTER — Ambulatory Visit: Admitting: Physical Therapy

## 2024-03-08 ENCOUNTER — Other Ambulatory Visit: Payer: Self-pay

## 2024-03-08 ENCOUNTER — Other Ambulatory Visit (HOSPITAL_COMMUNITY): Payer: Self-pay

## 2024-03-18 ENCOUNTER — Other Ambulatory Visit (HOSPITAL_COMMUNITY): Payer: Self-pay

## 2024-03-19 ENCOUNTER — Telehealth: Payer: Self-pay | Admitting: Family Medicine

## 2024-03-19 ENCOUNTER — Other Ambulatory Visit: Payer: Self-pay

## 2024-03-19 ENCOUNTER — Other Ambulatory Visit (HOSPITAL_COMMUNITY): Payer: Self-pay

## 2024-03-19 NOTE — Telephone Encounter (Signed)
 Copied from CRM #[id number]. Topic: Clinical - Medication Prior Auth >> Mar 19, 2024  3:00 PM Dedra B wrote:  Reason for CRM: Pt needs PA for dapagliflozin  propanediol (FARXIGA ) 10 MG TABS tablet.

## 2024-03-19 NOTE — Telephone Encounter (Signed)
 FYI

## 2024-03-20 ENCOUNTER — Other Ambulatory Visit: Payer: Self-pay

## 2024-03-20 ENCOUNTER — Other Ambulatory Visit (HOSPITAL_COMMUNITY): Payer: Self-pay

## 2024-03-21 ENCOUNTER — Other Ambulatory Visit (HOSPITAL_COMMUNITY): Payer: Self-pay

## 2024-03-22 ENCOUNTER — Other Ambulatory Visit: Payer: Self-pay

## 2024-03-25 NOTE — Progress Notes (Deleted)
 "  Office Visit    Patient Name: David [name] Date of Encounter: 03/25/2024  PCP:  Delbert Clam, MD    [facility]  Cardiologist:  Maude Emmer, MD  Advanced Practice Provider:  No care team member to display Electrophysiologist:  None   HPI    David [name] is a 63 y.o. male with a hx of diabetes mellitus, persistent atrial fibrillation (failed DCCV 09/2017 and attempted sotalol  with plan to repeat DCCV; DCCV not done and patient stopped meds), HFrEF (Myoview  in 2019 with mild anterior lateral ischemia), suspected OSA, hypertension presents today for follow-up appointment.    He was admitted 10/2018 with AF with RVR and low EF.  The plan at that time was to attempt rate control and repeat echocardiogram 3 months later to check for improvement.  If his EF did not improve, he would need ischemic eval.  He was lost to follow-up at this time.  He was seen in the clinic in 3/22 and was supposed to return in 3 months.  He returned to the office 3/23 for follow-up visit with David Ferrier, PA-C.  He was complaining of fatigue and lethargy for about a year.  He had some shortness of breath when he climbs stairs but no chest pain, syncope, orthopnea, leg edema.  He does not smoke.  He was seen by me 7/23,, he recently had his toe amputated by [name] due to diabetes.  He states that his toe was infected and it spread his blood.  He had MRSA in his blood and is now on IV antibiotics for 6 weeks through a PICC line.  He states that he has not been very active over the past few weeks because of his toe.  He is a investment banker, operational for work and hopes that [name] will clear him on Friday to go back to work.  Due to all this going on, this was why he was never scheduled for his Lexiscan  Myoview .  I asked the patient to asked [name] when it would be appropriate to move forward with this test given his current condition.  Before his toe amputation, he admits to not taking his insulin  like he should  and not eating like he should.  Recent A1c was over 11.  He has however lost 60 pounds over the last year and has tried to make healthier choices lately.  Once he recovers from his toe surgery he plans to be more active.  He had a new onset diagnosis of atrial fibrillation with a failed cardioversion.  He was supposed to see EP and the appointment was never scheduled.  We will plan for him to follow-up with EP today.  He may be an ablation candidate.  He was also recently started on a statin due to his cholesterol levels when he was in the hospital.  We have arranged for him to get follow-up labs in 8 weeks to check a lipid panel.  He does have some shortness of breath with exertion but this could also be due to deconditioning.  Echocardiogram from his hospitalization reviewed with the patient.  He saw me 07/01/22, he tells me that he had his toe amputation and then ended up with more infection on the bottom of his foot.  They told him he did not have enough blood flow to heal it up.  They went into his leg and opened some arteries and it was still not healing.  They told him that he  was in our practice with amputated.  He went to get a second opinion at the wound center at [name].  They debrided that but did not cast every week.  It did not have stopped healing and ultimately he got an amputation of his pinky toe on that side.  Now he is finally able to walk and able to go back to work.  Heart: He wants to get this atrial fibrillation under control.  He is in A-fib with RVR rate 106 bpm.  He needs another cardioversion. We have asked him to not miss a dose of Eliquis .  He is a investment banker, operational and gets a lot of exercise at work.  He has to do some heavy lifting and is always on the go.  No drinking or smoking.    Reports no shortness of breath nor dyspnea on exertion. Reports no chest pain, pressure, or tightness. No edema, orthopnea, PND.    I saw him 6/25, he presents with a significant past medical history of  atrial fibrillation, diabetes, and recent major surgeries including spinal surgery and coronary artery bypass graft (CABG x 4),  for follow-up. The patient reports having undergone spinal surgery due to a bursary infection leading to sepsis. Following recovery from the spinal surgery, the patient underwent CABG. The patient reports significant improvement post-surgeries, with increased mobility and overall well-being. However, the patient continues to experience some balance issues and pain related to the spinal surgery.  The patient also reports a history of AFib, which appears to be well-managed post-CABG.  In addition to the cardiac and spinal issues, the patient reports a nasal polyp that has been causing breathing difficulties and taste alterations for about a year. We will look for clearance request.   The patient also mentions significant stress related to financial difficulties due to being out of work since the surgeries. The patient's disability has been approved but the check has not yet been received, causing additional stress.  Reports no shortness of breath nor dyspnea on exertion. Reports no chest pain, pressure, or tightness. No orthopnea, PND. Reports no palpitations.   Discussed the use of AI scribe software for clinical note transcription with the patient, who gave verbal consent to proceed.  Today, he ***  Past Medical History    Past Medical History:  Diagnosis Date   Cardiomyopathy (HCC)    a. EF 45% in 2019.   CHF (congestive heart failure) (HCC)    Chronic anticoagulation 09/30/2017   Diabetes mellitus type 2 in nonobese [facility])    Does not have health insurance    Essential hypertension 09/26/2017   Financial difficulties    H/O noncompliance with medical treatment, presenting hazards to health    Hardware complicating wound infection 09/25/2022   Non-insulin  treated type 2 diabetes mellitus (HCC) 09/26/2017   Persistent atrial fibrillation (HCC)    Sleep apnea  suspected 09/30/2017   years ago- not anymore per patient   Past Surgical History:  Procedure Laterality Date   ABDOMINAL AORTOGRAM W/LOWER EXTREMITY N/A 09/02/2021   Procedure: ABDOMINAL AORTOGRAM W/LOWER EXTREMITY;  Surgeon: Serene Gaile ORN, MD;  [facility]: [facility];  Service: Cardiovascular;  Laterality: N/A;   ABDOMINAL AORTOGRAM W/LOWER EXTREMITY N/A 12/28/2021   Procedure: ABDOMINAL AORTOGRAM W/LOWER EXTREMITY;  Surgeon: Serene Gaile ORN, MD;  Location: [facility];  Service: Cardiovascular;  Laterality: N/A;   ABDOMINAL AORTOGRAM W/LOWER EXTREMITY N/A 07/28/2022   Procedure: ABDOMINAL AORTOGRAM W/LOWER EXTREMITY;  Surgeon: Gretta Lonni PARAS, MD;  Location:  [facility];  Service: Cardiovascular;  Laterality: N/A;   AMPUTATION Right 09/04/2021   Procedure: 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;  Surgeon: Harden Jerona GAILS, MD;  Location: [facility] OR;  Service: Orthopedics;  Laterality: Right;   AMPUTATION Right 08/02/2022   Procedure: AMPUTATION RAY 5TH METATARSAL OF RIGHT FOOT;  Surgeon: Barton Drape, MD;  Location: [facility] OR;  Service: Orthopedics;  Laterality: Right;   APPENDECTOMY  1971   BONE BIOPSY Right 08/02/2022   Procedure: BONE BIOPSY OF 5TH METATARSAL RIGHT FOOT;  Surgeon: Barton Drape, MD;  Location: [facility] OR;  Service: Orthopedics;  Laterality: Right;   CARDIOVERSION N/A 09/28/2017   Procedure: CARDIOVERSION;  Surgeon: Francyne Headland, MD;  Location: [facility];  Service: Cardiovascular;  Laterality: N/A;   CLIPPING OF ATRIAL APPENDAGE N/A 04/18/2023   Procedure: CLIPPING OF ATRIAL APPENDAGE USING ATRICURE ATRICLIP [id number] with pulmonary vein isolation ablation;  Surgeon: Lucas Dorise POUR, MD;  Location: [facility] OR;  Service: Open Heart Surgery;  Laterality: N/A;   CORONARY ARTERY BYPASS GRAFT N/A 04/18/2023   Procedure: CORONARY ARTERY BYPASS GRAFTING (CABG) TIMES FOUR USING LEFT INTERNAL MAMMARY ARTERY AND ENDOSCOPICALLY HARVESTED LEFT GREATER SAPHENOUS VEIN;   Surgeon: Lucas Dorise POUR, MD;  Location: [facility] OR;  Service: Open Heart Surgery;  Laterality: N/A;   IRRIGATION AND DEBRIDEMENT FOOT  08/02/2022   Procedure: IRRIGATION AND DEBRIDEMENT RIGHT 5TH METATARSAL FOOT;  Surgeon: Barton Drape, MD;  Location: [facility];  Service: Orthopedics;;   LAMINECTOMY WITH POSTERIOR LATERAL ARTHRODESIS LEVEL 4 N/A 08/30/2022   Procedure: THORACIC FIVE-THORACIC ELEVEN FUSION, THORACIC EIGHT TRANSPEDICULAR DECOMPRESSION AND PARTIAL CORPECTOMY with O-Arm;  Surgeon: Debby Dorn MATSU, MD;  Location: [facility] OR;  Service: Neurosurgery;  Laterality: N/A;   LEFT HEART CATH AND CORONARY ANGIOGRAPHY N/A 04/12/2023   Procedure: LEFT HEART CATH AND CORONARY ANGIOGRAPHY;  Surgeon: Verlin Lonni BIRCH, MD;  [facility];  Service: Cardiovascular;  Laterality: N/A;   NASAL ENDOSCOPY Right 06/30/2023   Procedure: ENDOSCOPY, NOSE;  Surgeon: Jesus Oliphant, MD;  Location: [facility] OR;  Service: ENT;  Laterality: Right;   PERIPHERAL VASCULAR BALLOON ANGIOPLASTY  09/02/2021   Procedure: PERIPHERAL VASCULAR BALLOON ANGIOPLASTY;  Surgeon: Serene Gaile ORN, MD;  [facility];  Service: Cardiovascular;;   PERIPHERAL VASCULAR BALLOON ANGIOPLASTY  12/28/2021   Procedure: PERIPHERAL VASCULAR BALLOON ANGIOPLASTY;  Surgeon: Serene Gaile ORN, MD;  [facility];  Service: Cardiovascular;;  Posterior Tibial PTA only   POLYPECTOMY Right 06/30/2023   Procedure: POLYPECTOMY, NASAL CAVITY;  Surgeon: Jesus Oliphant, MD;  Location: [facility] OR;  Service: ENT;  Laterality: Right;   TEE WITHOUT CARDIOVERSION N/A 09/28/2017   Procedure: TRANSESOPHAGEAL ECHOCARDIOGRAM (TEE);  Surgeon: Francyne Headland, MD;  Location: [facility] ENDOSCOPY;  Service: Cardiovascular;  Laterality: N/A;   TEE WITHOUT CARDIOVERSION N/A 09/03/2021   Procedure: TRANSESOPHAGEAL ECHOCARDIOGRAM (TEE);  Surgeon: Raford Riggs, MD;  Location: [facility] ENDOSCOPY;  Service: Cardiovascular;  Laterality: N/A;   TEE WITHOUT  CARDIOVERSION N/A 07/15/2022   Procedure: TRANSESOPHAGEAL ECHOCARDIOGRAM;  Surgeon: Francyne Headland, MD;  [facility];  Service: Cardiovascular;  Laterality: N/A;   TEE WITHOUT CARDIOVERSION N/A 04/18/2023   Procedure: TRANSESOPHAGEAL ECHOCARDIOGRAM (TEE);  Surgeon: Lucas Dorise POUR, MD;  Location: [facility] OR;  Service: Open Heart Surgery;  Laterality: N/A;   TOTAL HIP ARTHROPLASTY Left 07/14/2023   Procedure: ARTHROPLASTY, HIP, TOTAL, ANTERIOR APPROACH;  Surgeon: Kendal Franky SQUIBB, MD;  Location: [facility];  Service: Orthopedics;  Laterality: Left;    Allergies  No Known Allergies   EKGs/Labs/Other Studies Reviewed:   The following studies were reviewed today:  09/03/2021 echocardiogram  IMPRESSIONS     1. Left ventricular ejection fraction, by estimation, is 60 to 65%. The  left ventricle has normal function. The left ventricle has no regional  wall motion abnormalities.   2. Right ventricular systolic function is normal. The right ventricular  size is normal.   3. No left atrial/left atrial appendage thrombus was detected.   4. The mitral valve is normal in structure. Trivial mitral valve  regurgitation. No evidence of mitral stenosis.   5. The aortic valve is tricuspid. Aortic valve regurgitation is not  visualized. No aortic stenosis is present.   6. The inferior vena cava is normal in size with greater than 50%  respiratory variability, suggesting right atrial pressure of 3 mmHg.   Conclusion(s)/Recommendation(s): Normal biventricular function without  evidence of hemodynamically significant valvular heart disease.   Echocardiogram 10/17/2018 EF 35-40, moderate LVH, moderately reduced RVSF, mild to moderate MR, RVSP 36.3   Myoview  09/29/2017 Mild mid anterolateral reversibility-equivocal finding, EF 44; intermediate risk Reviewed by Dr. Delford - no infarct; ?mild ant-lat ischemia >> focus on Rx of AFib at that time  EKG:  EKG is ordered today.  Atrial fibrillation  with RVR, rate 106 bpm  Recent Labs: 07/13/2023: ALT 16 07/15/2023: B Natriuretic Peptide 126.5 07/16/2023: TSH 2.335 07/19/2023: Magnesium  2.0 10/26/2023: BUN 37; Creatinine, Ser 1.83; Hemoglobin 14.1; Platelets 211; Potassium 4.8; Sodium 136  Recent Lipid Panel    Component Value Date/Time   CHOL 199 01/09/2023 0841   TRIG 235 (H) 01/09/2023 0841   HDL 36 (L) 01/09/2023 0841   CHOLHDL 5.9 (H) 12/14/2021 1025   CHOLHDL 6.6 09/03/2021 0314   VLDL 50 (H) 09/03/2021 0314   LDLCALC 121 (H) 01/09/2023 0841    Home Medications   No outpatient medications have been marked as taking for the 03/26/24 encounter (Appointment) with Lucien Orren SAILOR, PA-C.     Review of Systems      All other systems reviewed and are otherwise negative except as noted above.  Physical Exam    VS:  There were no vitals taken for this visit. , BMI There is no height or weight on file to calculate BMI.  Wt Readings from Last 3 Encounters:  11/09/23 195 lb (88.5 kg)  10/26/23 201 lb (91.2 kg)  10/24/23 201 lb 6.4 oz (91.4 kg)     GEN: Well nourished, well developed, in no acute distress. HEENT: normal. Neck: Supple, no JVD, carotid bruits, or masses. Cardiac: Irregularly irregular, no murmurs, rubs, or gallops. No clubbing, cyanosis, mild bilateral edema.  Radials/PT 2+ and equal bilaterally.  Respiratory:  Respirations regular and unlabored, clear to auscultation bilaterally. GI: Soft, nontender, nondistended. MS: No deformity or atrophy. Skin: Warm and dry, no rash. Neuro:  Strength and sensation are intact. Psych: Normal affect.  Assessment & Plan    Atrial Fibrillation (AFib) Chronic AFib with previous unsuccessful cardioversion. Post-CABG, pulmonary vein ablation performed. Currently in sinus tachycardia. Left atrial appendage clipped to reduce stroke risk. - Increase carvedilol  to 6.25 mg twice daily. - Coordinate with primary care provider regarding medication changes.  Coronary Artery Bypass  Grafting (CABG) Recent CABG for severe coronary artery disease. Recovering with post-operative leg swelling and pain. - Advise warm compresses and leg elevation to reduce swelling. - Monitor for signs of infection or complications at surgical sites. -increase coreg  for better HR and BP control  Diabetes Mellitus  Diabetes well-controlled with insulin . Recent blood glucose levels under 150 mg/dL, J8r at 4.4%. - Continue current insulin  regimen. - Recheck A1c with primary care provider.  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 Significant progress with physical therapy. - Continue physical therapy at home. - Encourage use of resistance bands and walking for rehabilitation.  Nasal Polyp Nasal polyp causing breathing difficulties and loss of taste. Scheduled for surgical removal. - Coordinate with preoperative department for nasal polyp removal. - Hold Eliquis  for 1-2 days prior to surgery.  Follow-up Follow-up plans to ensure continuity of care. - Schedule follow-up appointment in a few months with cardiologist. - Prepare for lipid panel at next visit, ensuring fasting prior to test.  Disposition: Follow up 3 months with Maude Emmer, MD or APP.  Signed, Orren LOISE Fabry, PA-C 03/25/2024, 12:01 PM [facility]  "

## 2024-03-26 ENCOUNTER — Ambulatory Visit: Admitting: Physician Assistant

## 2024-03-26 DIAGNOSIS — I10 Essential (primary) hypertension: Secondary | ICD-10-CM

## 2024-03-26 DIAGNOSIS — I5020 Unspecified systolic (congestive) heart failure: Secondary | ICD-10-CM

## 2024-03-26 DIAGNOSIS — I480 Paroxysmal atrial fibrillation: Secondary | ICD-10-CM

## 2024-03-26 DIAGNOSIS — Z951 Presence of aortocoronary bypass graft: Secondary | ICD-10-CM

## 2024-03-26 DIAGNOSIS — Z79899 Other long term (current) drug therapy: Secondary | ICD-10-CM

## 2024-03-26 DIAGNOSIS — I2510 Atherosclerotic heart disease of native coronary artery without angina pectoris: Secondary | ICD-10-CM

## 2024-03-26 DIAGNOSIS — I5022 Chronic systolic (congestive) heart failure: Secondary | ICD-10-CM

## 2024-03-26 DIAGNOSIS — I4819 Other persistent atrial fibrillation: Secondary | ICD-10-CM

## 2024-04-04 ENCOUNTER — Other Ambulatory Visit (HOSPITAL_COMMUNITY): Payer: Self-pay

## 2024-04-05 ENCOUNTER — Other Ambulatory Visit (HOSPITAL_COMMUNITY): Payer: Self-pay

## 2024-04-08 ENCOUNTER — Other Ambulatory Visit (HOSPITAL_COMMUNITY): Payer: Self-pay

## 2024-04-08 ENCOUNTER — Other Ambulatory Visit: Payer: Self-pay | Admitting: Family Medicine

## 2024-04-08 DIAGNOSIS — M4644 Discitis, unspecified, thoracic region: Secondary | ICD-10-CM

## 2024-04-09 ENCOUNTER — Other Ambulatory Visit (HOSPITAL_COMMUNITY): Payer: Self-pay

## 2024-04-09 ENCOUNTER — Ambulatory Visit (HOSPITAL_COMMUNITY): Admitting: Clinical

## 2024-04-09 ENCOUNTER — Other Ambulatory Visit: Payer: Self-pay | Admitting: Family Medicine

## 2024-04-09 ENCOUNTER — Other Ambulatory Visit: Payer: Self-pay

## 2024-04-09 ENCOUNTER — Other Ambulatory Visit: Payer: Self-pay | Admitting: Internal Medicine

## 2024-04-09 DIAGNOSIS — I5020 Unspecified systolic (congestive) heart failure: Secondary | ICD-10-CM

## 2024-04-09 DIAGNOSIS — R300 Dysuria: Secondary | ICD-10-CM

## 2024-04-09 MED ORDER — PREGABALIN 75 MG PO CAPS
75.0000 mg | ORAL_CAPSULE | Freq: Two times a day (BID) | ORAL | 1 refills | Status: AC
  Filled 2024-04-09 – 2024-05-05 (×2): qty 60, 30d supply, fill #0

## 2024-04-09 MED ORDER — FAMOTIDINE 20 MG PO TABS
20.0000 mg | ORAL_TABLET | Freq: Two times a day (BID) | ORAL | 0 refills | Status: DC
  Filled 2024-04-09: qty 60, 30d supply, fill #0

## 2024-04-09 MED ORDER — DAPAGLIFLOZIN PROPANEDIOL 10 MG PO TABS
10.0000 mg | ORAL_TABLET | Freq: Every day | ORAL | 1 refills | Status: AC
  Filled 2024-04-09 – 2024-04-11 (×2): qty 30, 30d supply, fill #0

## 2024-04-10 ENCOUNTER — Other Ambulatory Visit: Payer: Self-pay

## 2024-04-10 ENCOUNTER — Other Ambulatory Visit (HOSPITAL_COMMUNITY): Payer: Self-pay

## 2024-04-10 MED ORDER — DOXYCYCLINE HYCLATE 100 MG PO TABS
100.0000 mg | ORAL_TABLET | Freq: Two times a day (BID) | ORAL | 0 refills | Status: AC
  Filled 2024-04-10: qty 60, 30d supply, fill #0

## 2024-04-10 MED ORDER — FAMOTIDINE 20 MG PO TABS
20.0000 mg | ORAL_TABLET | Freq: Two times a day (BID) | ORAL | 0 refills | Status: AC
  Filled 2024-04-10 – 2024-05-05 (×2): qty 60, 30d supply, fill #0

## 2024-04-10 MED ORDER — FUROSEMIDE 40 MG PO TABS
40.0000 mg | ORAL_TABLET | Freq: Two times a day (BID) | ORAL | 0 refills | Status: AC
  Filled 2024-04-10 – 2024-04-18 (×2): qty 60, 30d supply, fill #0

## 2024-04-10 NOTE — Telephone Encounter (Signed)
Yes, and thank you

## 2024-04-10 NOTE — Telephone Encounter (Signed)
 Patient last seen 03/2023. Added to scheduling list for appointment. Okay to refill 30 days in the meantime?

## 2024-04-11 ENCOUNTER — Telehealth (INDEPENDENT_AMBULATORY_CARE_PROVIDER_SITE_OTHER): Payer: Self-pay | Admitting: Licensed Clinical Social Worker

## 2024-04-11 ENCOUNTER — Other Ambulatory Visit (HOSPITAL_COMMUNITY): Payer: Self-pay

## 2024-04-11 ENCOUNTER — Ambulatory Visit (HOSPITAL_COMMUNITY): Payer: Self-pay | Admitting: Licensed Clinical Social Worker

## 2024-04-11 ENCOUNTER — Other Ambulatory Visit: Payer: Self-pay

## 2024-04-11 NOTE — Telephone Encounter (Addendum)
 David [name] had an in-person assessment scheduled today for 1pm.  Clinician outreached David [name] by phone at 1:07pm when he had not presented on time.  David [name] did not answer this phone call, so a voicemail was left reminding him of appointment, and callback number for front desk was included. Clinician informed front desk staff of no-show event when David [name] did not present in person or return call about absence by 1:15pm.     Darleene Ricker, LCSW, LCAS 04/11/24     Edit: Clinician informed by front desk staff at 2:38pm that David [name] had left a voicemail earlier in the day requesting to cancel appointment due to medical issue (severe migraine).

## 2024-04-18 ENCOUNTER — Other Ambulatory Visit (HOSPITAL_COMMUNITY): Payer: Self-pay

## 2024-04-18 ENCOUNTER — Other Ambulatory Visit: Payer: Self-pay

## 2024-04-28 ENCOUNTER — Other Ambulatory Visit: Payer: Self-pay

## 2024-04-28 ENCOUNTER — Other Ambulatory Visit (HOSPITAL_COMMUNITY): Payer: Self-pay

## 2024-05-01 ENCOUNTER — Ambulatory Visit: Admitting: Internal Medicine

## 2024-05-06 ENCOUNTER — Other Ambulatory Visit: Payer: Self-pay | Admitting: Family Medicine

## 2024-05-06 ENCOUNTER — Other Ambulatory Visit: Payer: Self-pay | Admitting: Physician Assistant

## 2024-05-06 ENCOUNTER — Other Ambulatory Visit: Payer: Self-pay

## 2024-05-06 ENCOUNTER — Other Ambulatory Visit (HOSPITAL_COMMUNITY): Payer: Self-pay

## 2024-05-06 DIAGNOSIS — I5020 Unspecified systolic (congestive) heart failure: Secondary | ICD-10-CM

## 2024-05-07 ENCOUNTER — Other Ambulatory Visit (HOSPITAL_COMMUNITY): Payer: Self-pay

## 2024-05-07 MED ORDER — CARVEDILOL 6.25 MG PO TABS
6.2500 mg | ORAL_TABLET | Freq: Two times a day (BID) | ORAL | 3 refills | Status: AC
  Filled 2024-05-07: qty 180, 90d supply, fill #0

## 2024-05-08 ENCOUNTER — Ambulatory Visit: Admitting: Internal Medicine

## 2024-05-13 ENCOUNTER — Ambulatory Visit: Admitting: Physician Assistant

## 2024-05-22 ENCOUNTER — Ambulatory Visit: Admitting: Internal Medicine
